# Patient Record
Sex: Male | Born: 1965 | Race: Black or African American | Hispanic: No | Marital: Single | Smoking: Former smoker
Health system: Southern US, Community
[De-identification: ages and names within clinical notes are randomized; demographics above are authoritative.]

## PROBLEM LIST (undated history)

## (undated) DIAGNOSIS — I12 Hypertensive chronic kidney disease with 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: Secondary | ICD-10-CM

## (undated) DIAGNOSIS — R6889 Other general symptoms and signs: Secondary | ICD-10-CM

## (undated) DIAGNOSIS — I272 Pulmonary hypertension, unspecified: Secondary | ICD-10-CM

## (undated) DIAGNOSIS — C778 Secondary and unspecified malignant neoplasm of lymph nodes of multiple regions: Secondary | ICD-10-CM

## (undated) DIAGNOSIS — I10 Essential (primary) hypertension: Secondary | ICD-10-CM

## (undated) DIAGNOSIS — F419 Anxiety disorder, unspecified: Secondary | ICD-10-CM

## (undated) DIAGNOSIS — G4730 Sleep apnea, unspecified: Secondary | ICD-10-CM

## (undated) DIAGNOSIS — N2581 Secondary hyperparathyroidism of renal origin: Secondary | ICD-10-CM

## (undated) DIAGNOSIS — M1990 Unspecified osteoarthritis, unspecified site: Secondary | ICD-10-CM

## (undated) DIAGNOSIS — D631 Anemia in chronic kidney disease: Secondary | ICD-10-CM

## (undated) DIAGNOSIS — K7460 Unspecified cirrhosis of liver: Secondary | ICD-10-CM

## (undated) DIAGNOSIS — I493 Ventricular premature depolarization: Secondary | ICD-10-CM

## (undated) DIAGNOSIS — R0602 Shortness of breath: Secondary | ICD-10-CM

## (undated) DIAGNOSIS — K449 Diaphragmatic hernia without obstruction or gangrene: Secondary | ICD-10-CM

## (undated) DIAGNOSIS — I509 Heart failure, unspecified: Secondary | ICD-10-CM

## (undated) DIAGNOSIS — I440 Atrioventricular block, first degree: Secondary | ICD-10-CM

## (undated) DIAGNOSIS — I471 Supraventricular tachycardia, unspecified: Secondary | ICD-10-CM

## (undated) DIAGNOSIS — Z992 Dependence on renal dialysis: Secondary | ICD-10-CM

## (undated) DIAGNOSIS — D649 Anemia, unspecified: Secondary | ICD-10-CM

## (undated) DIAGNOSIS — I119 Hypertensive heart disease without heart failure: Secondary | ICD-10-CM

## (undated) DIAGNOSIS — T82898A Other specified complication of vascular prosthetic devices, implants and grafts, initial encounter: Secondary | ICD-10-CM

## (undated) DIAGNOSIS — F329 Major depressive disorder, single episode, unspecified: Secondary | ICD-10-CM

## (undated) DIAGNOSIS — E785 Hyperlipidemia, unspecified: Secondary | ICD-10-CM

## (undated) DIAGNOSIS — Z860101 Personal history of adenomatous and serrated colon polyps: Secondary | ICD-10-CM

## (undated) DIAGNOSIS — Z973 Presence of spectacles and contact lenses: Secondary | ICD-10-CM

## (undated) DIAGNOSIS — R011 Cardiac murmur, unspecified: Secondary | ICD-10-CM

## (undated) DIAGNOSIS — C61 Malignant neoplasm of prostate: Secondary | ICD-10-CM

## (undated) DIAGNOSIS — Z87898 Personal history of other specified conditions: Secondary | ICD-10-CM

## (undated) DIAGNOSIS — J189 Pneumonia, unspecified organism: Secondary | ICD-10-CM

## (undated) DIAGNOSIS — F32A Depression, unspecified: Secondary | ICD-10-CM

## (undated) DIAGNOSIS — I639 Cerebral infarction, unspecified: Secondary | ICD-10-CM

## (undated) DIAGNOSIS — N189 Chronic kidney disease, unspecified: Secondary | ICD-10-CM

## (undated) DIAGNOSIS — N289 Disorder of kidney and ureter, unspecified: Secondary | ICD-10-CM

## (undated) DIAGNOSIS — I428 Other cardiomyopathies: Secondary | ICD-10-CM

## (undated) DIAGNOSIS — Z8679 Personal history of other diseases of the circulatory system: Secondary | ICD-10-CM

## (undated) DIAGNOSIS — J449 Chronic obstructive pulmonary disease, unspecified: Secondary | ICD-10-CM

## (undated) DIAGNOSIS — K219 Gastro-esophageal reflux disease without esophagitis: Secondary | ICD-10-CM

## (undated) DIAGNOSIS — Z86010 Personal history of colonic polyps: Secondary | ICD-10-CM

## (undated) DIAGNOSIS — Z5189 Encounter for other specified aftercare: Secondary | ICD-10-CM

## (undated) DIAGNOSIS — A419 Sepsis, unspecified organism: Secondary | ICD-10-CM

## (undated) DIAGNOSIS — N186 End stage renal disease: Secondary | ICD-10-CM

## (undated) HISTORY — DX: Secondary hyperparathyroidism of renal origin: N25.81

## (undated) HISTORY — DX: Unspecified osteoarthritis, unspecified site: M19.90

## (undated) HISTORY — DX: Cardiac murmur, unspecified: R01.1

## (undated) HISTORY — DX: Other cardiomyopathies: I42.8

## (undated) HISTORY — DX: Anemia, unspecified: D64.9

## (undated) HISTORY — DX: Chronic obstructive pulmonary disease, unspecified: J44.9

## (undated) HISTORY — DX: Encounter for other specified aftercare: Z51.89

## (undated) HISTORY — PX: UPPER GASTROINTESTINAL ENDOSCOPY: SHX188

## (undated) HISTORY — DX: Supraventricular tachycardia, unspecified: I47.10

## (undated) HISTORY — DX: Pulmonary hypertension, unspecified: I27.20

## (undated) HISTORY — DX: Ventricular premature depolarization: I49.3

## (undated) HISTORY — DX: Supraventricular tachycardia: I47.1

## (undated) HISTORY — DX: Hyperlipidemia, unspecified: E78.5

## (undated) HISTORY — PX: COLONOSCOPY: SHX174

## (undated) HISTORY — DX: Essential (primary) hypertension: I10

---

## 2002-06-06 ENCOUNTER — Encounter: Payer: Self-pay | Admitting: Emergency Medicine

## 2002-06-06 ENCOUNTER — Encounter: Payer: Self-pay | Admitting: Cardiovascular Disease

## 2002-06-06 ENCOUNTER — Inpatient Hospital Stay (HOSPITAL_COMMUNITY): Admission: EM | Admit: 2002-06-06 | Discharge: 2002-06-08 | Payer: Self-pay | Admitting: Emergency Medicine

## 2003-08-18 ENCOUNTER — Inpatient Hospital Stay (HOSPITAL_COMMUNITY): Admission: EM | Admit: 2003-08-18 | Discharge: 2003-08-22 | Payer: Self-pay | Admitting: Emergency Medicine

## 2003-08-18 ENCOUNTER — Encounter: Payer: Self-pay | Admitting: Internal Medicine

## 2003-11-15 ENCOUNTER — Encounter: Admission: RE | Admit: 2003-11-15 | Discharge: 2003-11-15 | Payer: Self-pay | Admitting: Nephrology

## 2003-12-08 ENCOUNTER — Ambulatory Visit: Payer: Self-pay | Admitting: Cardiovascular Disease

## 2004-04-10 ENCOUNTER — Inpatient Hospital Stay (HOSPITAL_COMMUNITY): Admission: AD | Admit: 2004-04-10 | Discharge: 2004-04-12 | Payer: Self-pay | Admitting: Nephrology

## 2004-06-01 ENCOUNTER — Emergency Department (HOSPITAL_COMMUNITY): Admission: EM | Admit: 2004-06-01 | Discharge: 2004-06-01 | Payer: Self-pay | Admitting: Emergency Medicine

## 2006-12-09 ENCOUNTER — Encounter (HOSPITAL_COMMUNITY): Admission: RE | Admit: 2006-12-09 | Discharge: 2007-03-09 | Payer: Self-pay | Admitting: Nephrology

## 2007-02-04 DIAGNOSIS — N186 End stage renal disease: Secondary | ICD-10-CM

## 2007-02-04 DIAGNOSIS — G4733 Obstructive sleep apnea (adult) (pediatric): Secondary | ICD-10-CM

## 2007-02-04 HISTORY — DX: Obstructive sleep apnea (adult) (pediatric): G47.33

## 2007-02-04 HISTORY — DX: Dependence on renal dialysis: N18.6

## 2007-02-04 HISTORY — PX: AV FISTULA PLACEMENT: SHX1204

## 2007-02-10 ENCOUNTER — Ambulatory Visit: Payer: Self-pay | Admitting: Vascular Surgery

## 2007-02-16 ENCOUNTER — Ambulatory Visit: Payer: Self-pay | Admitting: Cardiology

## 2007-02-18 ENCOUNTER — Ambulatory Visit: Payer: Self-pay | Admitting: Internal Medicine

## 2007-02-18 ENCOUNTER — Inpatient Hospital Stay (HOSPITAL_COMMUNITY): Admission: RE | Admit: 2007-02-18 | Discharge: 2007-02-24 | Payer: Self-pay | Admitting: Vascular Surgery

## 2007-02-18 ENCOUNTER — Ambulatory Visit: Payer: Self-pay | Admitting: Vascular Surgery

## 2007-02-18 DIAGNOSIS — I12 Hypertensive chronic kidney disease with 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: Secondary | ICD-10-CM | POA: Insufficient documentation

## 2007-02-19 ENCOUNTER — Encounter: Payer: Self-pay | Admitting: Vascular Surgery

## 2007-02-22 HISTORY — PX: AV FISTULA PLACEMENT, RADIOCEPHALIC: SHX1208

## 2007-03-05 DIAGNOSIS — Z87891 Personal history of nicotine dependence: Secondary | ICD-10-CM | POA: Insufficient documentation

## 2007-03-05 DIAGNOSIS — Z91199 Patient's noncompliance with other medical treatment and regimen due to unspecified reason: Secondary | ICD-10-CM | POA: Insufficient documentation

## 2007-03-05 DIAGNOSIS — N2581 Secondary hyperparathyroidism of renal origin: Secondary | ICD-10-CM | POA: Insufficient documentation

## 2007-03-11 ENCOUNTER — Ambulatory Visit: Payer: Self-pay | Admitting: *Deleted

## 2007-03-17 ENCOUNTER — Emergency Department (HOSPITAL_COMMUNITY): Admission: EM | Admit: 2007-03-17 | Discharge: 2007-03-18 | Payer: Self-pay | Admitting: Emergency Medicine

## 2007-04-21 ENCOUNTER — Ambulatory Visit: Payer: Self-pay | Admitting: Vascular Surgery

## 2007-04-27 ENCOUNTER — Ambulatory Visit: Payer: Self-pay | Admitting: Vascular Surgery

## 2007-04-27 ENCOUNTER — Ambulatory Visit (HOSPITAL_COMMUNITY): Admission: RE | Admit: 2007-04-27 | Discharge: 2007-04-27 | Payer: Self-pay | Admitting: Vascular Surgery

## 2007-05-11 ENCOUNTER — Ambulatory Visit (HOSPITAL_BASED_OUTPATIENT_CLINIC_OR_DEPARTMENT_OTHER): Admission: RE | Admit: 2007-05-11 | Discharge: 2007-05-11 | Payer: Self-pay | Admitting: Nephrology

## 2007-05-11 ENCOUNTER — Encounter: Payer: Self-pay | Admitting: Internal Medicine

## 2007-05-15 ENCOUNTER — Ambulatory Visit: Payer: Self-pay | Admitting: Internal Medicine

## 2007-05-21 ENCOUNTER — Emergency Department (HOSPITAL_COMMUNITY): Admission: EM | Admit: 2007-05-21 | Discharge: 2007-05-21 | Payer: Self-pay | Admitting: Emergency Medicine

## 2007-05-26 ENCOUNTER — Ambulatory Visit: Payer: Self-pay | Admitting: Vascular Surgery

## 2007-10-19 ENCOUNTER — Ambulatory Visit: Payer: Self-pay | Admitting: Vascular Surgery

## 2007-10-19 ENCOUNTER — Ambulatory Visit (HOSPITAL_COMMUNITY): Admission: RE | Admit: 2007-10-19 | Discharge: 2007-10-19 | Payer: Self-pay | Admitting: Vascular Surgery

## 2007-10-28 ENCOUNTER — Ambulatory Visit (HOSPITAL_COMMUNITY): Admission: RE | Admit: 2007-10-28 | Discharge: 2007-10-28 | Payer: Self-pay | Admitting: Nephrology

## 2008-02-14 ENCOUNTER — Ambulatory Visit (HOSPITAL_BASED_OUTPATIENT_CLINIC_OR_DEPARTMENT_OTHER): Admission: RE | Admit: 2008-02-14 | Discharge: 2008-02-14 | Payer: Self-pay | Admitting: Nephrology

## 2008-02-14 ENCOUNTER — Encounter: Payer: Self-pay | Admitting: Internal Medicine

## 2008-02-19 ENCOUNTER — Ambulatory Visit: Payer: Self-pay | Admitting: Internal Medicine

## 2008-04-06 ENCOUNTER — Ambulatory Visit: Payer: Self-pay | Admitting: Internal Medicine

## 2008-04-06 DIAGNOSIS — I10 Essential (primary) hypertension: Secondary | ICD-10-CM | POA: Insufficient documentation

## 2008-04-06 DIAGNOSIS — J45909 Unspecified asthma, uncomplicated: Secondary | ICD-10-CM | POA: Insufficient documentation

## 2008-04-06 DIAGNOSIS — G4730 Sleep apnea, unspecified: Secondary | ICD-10-CM | POA: Insufficient documentation

## 2008-04-11 ENCOUNTER — Telehealth: Payer: Self-pay | Admitting: Internal Medicine

## 2008-04-17 ENCOUNTER — Encounter: Payer: Self-pay | Admitting: Internal Medicine

## 2010-02-24 ENCOUNTER — Encounter: Payer: Self-pay | Admitting: Nephrology

## 2010-04-24 ENCOUNTER — Inpatient Hospital Stay (INDEPENDENT_AMBULATORY_CARE_PROVIDER_SITE_OTHER)
Admission: RE | Admit: 2010-04-24 | Discharge: 2010-04-24 | Disposition: A | Discharge status: Home / Self Care | Payer: BC Managed Care – PPO | Source: Ambulatory Visit | Attending: Emergency Medicine | Admitting: Emergency Medicine

## 2010-04-24 DIAGNOSIS — H60399 Other infective otitis externa, unspecified ear: Secondary | ICD-10-CM

## 2010-06-18 NOTE — Op Note (Signed)
NAME:  James Wall, James Wall             ACCOUNT NO.:  [id number]   MEDICAL RECORD NO.:  [id number]          PATIENT TYPE:  INP   LOCATION:  2550                         FACILITY:  [facility]   PHYSICIAN:  Theotis Burrow IV, MDDATE OF BIRTH:  [birth year]   DATE OF PROCEDURE:  02/18/2007  DATE OF DISCHARGE:                               OPERATIVE REPORT   PREOPERATIVE DIAGNOSIS:  End-stage renal disease.   POSTOPERATIVE DIAGNOSIS:  End-stage renal disease.   PROCEDURES PERFORMED:  1. Ultrasound access of right internal jugular vein.  2. Diatek catheter placement.   SURGEON:  1. Annamarie Major IV, MD   ANESTHESIA:  Local.   FINDINGS:  Catheter at cavoatrial junction.   INDICATIONS:  This is a 45 year old gentleman who initially presented  for a left-sided wrist fistula, however, developed shortness of breath  in the holding area.  Subsequent chest x-ray revealed worsening  pulmonary edema.  I spoke with nephrology, and we have decided to  proceed with Diatek catheter placement and forego wrist fistula at this  time.  This was discussed with the patient and the family.  Informed  consent was signed.   PROCEDURE:  The patient was identified in the holding area and taken to  room #8.  He was placed supine on the table.  He was then prepped and  draped in the standard sterile fashion.  A timeout was called and  antibiotics were given.  The right internal jugular vein was then  evaluated with ultrasound and found to be widely patent and fully  compressible; 1% lidocaine was used for local anesthesia.  The right  internal jugular vein was accessed under ultrasound guidance with an 18-  gauge needle.  An 0.035 wire was then advanced into the heart under  fluoroscopic visualization.  Next, a series of dilators were used to  dilate the subcutaneous tract.  Ultimately, the peel-away sheath was  placed.  The catheter was then placed through the sheath and then the  sheath was removed.  The  catheter was then positioned with its tip at  the cavoatrial junction.  A site was selected for the skin exit site.  This was then anesthetized with 1% lidocaine as well as the subcutaneous  tunnel.  A #11-blade was used to make an incision and then a  subcutaneous tunnel was then created.  A dilator was used to dilate the  tract.  The catheter was then brought through the tunnel, and the cuff  was situated at the skin exit site.  Fluoroscopy was then used to  confirm that the catheter tip was at the cavoatrial junction and there  were no kinks within the catheter.  Both ports were then flushed.  They  were flushed and aspirated without difficulty.  The catheter was then  sutured into position with a 3-0 nylon.  The side of the neck was closed with a 4-0 Vicryl.  Sterile dressings  were applied.  Catheter was then filled with appropriate volumes of  heparin.  The patient was then taken to the recovery room in a stable  condition.  There were no complications.  ______________________________  V. Leia Alf, MD  Electronically Signed     VWB/MEDQ  D:  02/18/2007  T:  02/18/2007  Job:  [id number]

## 2010-06-18 NOTE — Assessment & Plan Note (Signed)
OFFICE VISIT   James Wall, James Wall  DOB:  Aug 24, 1965                                       02/10/2007  PPHKF#:[id number]   The patient is a 45 year old male referred by Dr. [name] for  consideration for hemodialysis access.  His renal failure is thought to  be secondary to hypertension.  He has had no prior access procedures.  He is currently not on dialysis.  He is right-handed.   He denies history of diabetes, coronary artery disease, or elevated  cholesterol.   SOCIAL HISTORY:  He is single.  Has no children.  Currently smokes 1/2  pack of cigarettes per day.  Does not drink alcohol regularly.   REVIEW OF SYSTEMS:  He is obese.  He is 5 feet 6 inches and 235 pounds.   MEDICATIONS:  Include iron sulfate 325 mg once a day.  Allopurinol 100  mg 3 in the morning.  Labetalol 300 mg 2 twice a day.  Furosemide 80 mg  2 twice a day.  Hectorol 0.5 mg once a day.  Sular 8.5 mg 2 tablets  daily.   PHYSICAL EXAM:  Blood pressure 160/108 in the left arm, 159/107 in the  right arm.  Heart rate is 87 and regular.  He has obese upper arms.  He  has 2+ brachial and radial pulses bilaterally.  On placement of a  tourniquet on the left upper extremity, the left cephalic vein is  palpable at the wrist and at the antecubital level.  He had a vein  mapping ultrasound today, which shows the vein is fairly uniform in  diameter at 3 mm.  It does enlarge to 7 mm at the level of the  antecubital crease.  I believe the best option for this patient is the  creation of a left radiocephalic AV fistula.  I did counsel him that, if  the vein appeared small at the time of the operation, we would place a  brachiocephalic AV fistula instead.  He understands and agrees to  proceed.  I did discuss with him the risks, benefits, and possible  complications of the procedure details, including, but not limited to  bleeding, infection, non-maturation, or thrombosis of the fistula.  He  understands and agrees to proceed.   Jessy Oto. Fields, MD  Electronically Signed   CEF/MEDQ  D:  02/10/2007  T:  02/11/2007  Job:  [id number]   cc:   Jeneen Rinks L. Deterding, M.D.

## 2010-06-18 NOTE — Op Note (Signed)
NAME:  James, Wall             ACCOUNT NO.:  [id number]   MEDICAL RECORD NO.:  [id number]          PATIENT TYPE:  AMB   LOCATION:  SDS                          FACILITY:  [facility]   PHYSICIAN:  Jessy Oto. Fields, MD  DATE OF BIRTH:  1965/02/26   DATE OF PROCEDURE:  04/27/2007  DATE OF DISCHARGE:                               OPERATIVE REPORT   PROCEDURE:  Left brachiocephalic AV fistula.   PREOPERATIVE DIAGNOSIS:  End-stage renal disease.   POSTOPERATIVE DIAGNOSIS:  End-stage renal disease.   ANESTHESIA:  Local with IV sedation.   OPERATIVE FINDINGS:  1. A 6.9-VX left cephalic vein.  2. A 3-mm left brachial artery.   SURGEON:  Jessy Oto. Fields, M.D.   ASSISTANT:  Jacinta Shoe, P.A.-C.   OPERATIVE DETAILS:  After obtaining informed consent, the patient was  taken to the operating room.  The patient was placed in supine position  on the operating table.  After adequate sedation, the patient's entire  left upper extremity prepped and draped usual sterile fashion.  Local  anesthesia was infiltrated near the antecubital crease.  A transverse  incision was made in this location and carried down through the  subcutaneous tissues down to the level of the cephalic vein.  The  cephalic vein was dissected free circumferentially.  Small side branches  were ligated and divided between silk ties.  The vein was approximately  4.5 mm in diameter.  Next, the brachial artery was dissected free in the  medial portion of the incision.  The artery was approximately 3 mm in  diameter.  Vessel loops were placed proximal and distal on the planned  site of arteriotomy.  The patient was then given 5000 units of  intravenous heparin.  The distal cephalic vein was ligated with a silk  tie.  The vein was then transected and swung over to the level of the  artery.  The vein was clamped proximally with a fine bulldog clamp.  Vessel loops were used to control the brachial artery.  A longitudinal  arteriotomy was made.  The vein was then sewn end of vein to side of  artery using a running 7-0 Prolene suture.  Just prior to completion of  the anastomosis, this was fore bled, back bled and thoroughly flushed.  The anastomosis was secured.  Vessel loops were released.  There was a  palpable thrill in the proximal aspect of the fistula.  Next, hemostasis  was obtained.  The subcutaneous tissues were reapproximated using  running 3-0 Vicryl suture.  The skin was closed with 4-0 Vicryl  subcuticular stitch.   The patient tolerated the procedure well, and there were no  complications.  Instrument, sponge, and needle counts were correct at  the end of the case.  The patient was taken to the recovery room in  stable condition.      Jessy Oto. Fields, MD  Electronically Signed     CEF/MEDQ  D:  04/27/2007  T:  04/27/2007  Job:  [id number]

## 2010-06-18 NOTE — H&P (Signed)
NAME:  James Wall, James Wall             ACCOUNT NO.:  [id number]   MEDICAL RECORD NO.:  [id number]          PATIENT TYPE:  INP   LOCATION:  2550                         FACILITY:  [facility]   PHYSICIAN:  Caren Griffins B. Lorrene Reid, M.D.DATE OF BIRTH:  [birth year]   DATE OF ADMISSION:  02/18/2007  DATE OF DISCHARGE:                              HISTORY & PHYSICAL   REASON FOR ADMISSION:  Uremia and pulmonary edema.   HISTORY OF PRESENT ILLNESS:  James Wall is a 45 year old African-  American male with a past medical history that is significant for  nephrosclerosis stage V kidney disease, who was followed by Dr. Jeneen Rinks  Wall.  He also has a significant history of COPD with ongoing  tobacco use, hypertension, anemia, secondary hyperparathyroidism.  The  patient has been followed by James Wall since 2005.  He was  last seen December 07, 2006 by James Wall, [facility] after the patient had  been on an 8 month hiatus.  At that time, all precautions were taken to  control the patient's hypertension.  Also discussed was the possibility  of soon hemodialysis.  He was then consulted with [facility] for permanent  access.   The patient presented to James Wall today for placement of permanent  access by James Wall.  Upon arrival preop, the patient was noted  to be short of breath with oxygen saturations in the 80s.  Pre-op chest  x-ray showed diffuse pulmonary edema.  James Wall spoke with Dr.  Jimmy Wall regarding the urgency and closeness of  patient requiring  hemodialysis.  James Wall did agree that the patient was ready to  start.  Therefore, permanent catheter was placed in the operating room  by James Wall.  We are admitting the patient for initiation of  hemodialysis as well as volume control.   In the recovery room, the patient complains of severe shortness of  breath.  He is currently on a non-rebreather mask.  Over the last 2-3  weeks, the patient explains that he has had some increasing  lethargy.  His appetite has waxed and waned.  He has had some a.m. nausea as well  as vomiting.  He sleeps on 2-3 pillows at night.  He does complain of  dyspnea on exertion.  He has had no chest pain.  No abdominal pain.  No  change in his bowel habits.  No urinary dysuria, burning or frequency.  He does complain of lower extremity fluid that is on and off.  He  states this seems to vary with his sodium intake.   PAST MEDICAL HISTORY:  See HPI.  1. 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      Followed  pre-hemodialysis by James Wall.  1. Congestive heart failure.  2. Anemia of chronic disease.  3. Secondary hyperparathyroidism.  4. COPD with ongoing tobacco use.  5. History of heart murmur followed by James Wall in 2004      (information not available).   ALLERGIES:  The patient states ASPIRIN causes increased fluid.   CURRENT MEDICATIONS:  1. Ferrous sulfate 325 mg daily.  2. Allopurinol  100 mg 2 p.o. daily.  3. Labetalol 300 mg 2 p.o. b.i.d.  4. Lasix 80 mg 2 b.i.d.  5. Hectorol 0.5 mcg daily.  6. Sular 34 mg daily.  7. Aranesp 100 mg subcu. q. week.   FAMILY HISTORY:  Noncontributory.   SOCIAL HISTORY:  The patient currently lives in [place] with his  mother and brother.  He has been out of work since November 2008.  He  has been in [employer] as a Freight forwarder.  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  He continues to smoke tobacco and has  for 20 years.  He smokes 2-3 packs every 2-3 days.  No alcohol.  No  illicit drug use.  Marland Kitchen   REVIEW OF SYSTEMS:  Please see HPI.  Eight systems reviewed and  remainder of the systems are negative.   PHYSICAL EXAMINATION:  VITAL SIGNS:  Temperature 98.6, heart rate 80,  respirations 16, blood pressure 153/76, O2 sats in the 80s on a non-  rebreather mask.  GENERAL:  James Wall is seen post-PermCath placement.  His sentences  are cut short secondary to breathing.  Mild  respiratory distress.  He is  tearful today regarding initiation of hemodialysis.  NECK:  Supple, noted JVD.  HEART:  Regular rate and rhythm, 2/6 systolic murmur heard best at apex.  No gallops or rubs.  LUNGS:  Diffuse crackles mid to base.  EXTREMITIES:  Noted brawny appearance to the calves bilaterally.  Trace  bilateral lower extremity edema.  Pulses 2+ and intact bilateral lower  extremities.  Right permanent catheter in place.   LABORATORY DATA:  Preop labs:  Potassium 4.3, hemoglobin 11.9.   ASSESSMENT/PLAN:  As discussed with Dr. Jamal Wall who was in to see  and evaluate this patient.  1. New end-stage renal disease in the setting of uremia and pulmonary      edema on admission.  The patient will be admitted to the step-down      unit for his mild respiratory distress.  He will undergo urgent      hemodialysis today and again in the morning with a total UF of 6      liters.  He will remain on oxygen for comfort and wean as      appropriate.  Orders given to review hemodialysis videos and      education materials.  Placed on renal vitamin.  Plan access hopeful this admission versus as an outpatient.  Unfortunately, the patient does not have insurance at this time and we  will plan on a social worker to meet with him and discuss financial  Medicaid forms.  His information has been given to [facility] in the dialysis  unit for clip purposes.  I believe his closest hemodialysis center will  be [facility].  Continue to follow the patient through the  night, however, hopeful that ultrafiltration will lessen his respiratory  distress.  Check a 2-D echocardiogram as well to assess LV function  given the patient's pulmonary edema without evidence of peripheral  edema.  1. Hypertension.  Blood pressures systolic in the 924Q.  We will      continue his labetalol and hold if needed.  Change his Sular to      more affordable lisinopril.  There are no allergies.  Continue  to      follow.  2. Anemia.  Stop patient's oral iron.  Continue his Aranesp.  Iron  stores were greater than 30 as an outpatient.  No need for IV iron      at this time.  Follow hemoglobins.  3. Secondary hyperparathyroidism.  Check phosphorus.  The patient was      not on a binder.  Continue Hectorol and change to IV.   DISPOSITION:  Plan on initiating hemodialysis.  Get the patient's  insurance matters handled as well as his outpatient hemodialysis spot.  He will need a permanent access.  Continue to follow.  Plans are to  return home with mother and brother when medically stable.      Leafy Kindle, PA    ______________________________  Elzie Rings Lorrene Reid, M.D.    MY/MEDQ  D:  02/18/2007  T:  02/18/2007  Job:  [id number]   cc:   [place]

## 2010-06-18 NOTE — Assessment & Plan Note (Signed)
OFFICE VISIT   James Wall, James Wall  DOB:  August 06, 1965                                       03/11/2007  PVXYI#:[id number]   Patient is postop left Cimino arteriovenous fistula on 02/22/07, carried  out by Dr. [name].  Incision healing unremarkably.  The fistula is  patent.  No evidence of steal.   BP 163/102, pulse 100 per minute, respirations 20 per minute, O2 sat  92%.   Patient instructed regarding exercise of his arm.  Fistula appears to be  maturing adequately.  Return p.r.n.   Dorothea Glassman, M.D.  Electronically Signed   PGH/MEDQ  D:  03/11/2007  T:  03/15/2007  Job:  [id number]

## 2010-06-18 NOTE — Procedures (Signed)
NAME:  James Wall, James Wall            ACCOUNT NO.:  [id number]   MEDICAL RECORD NO.:  [id number]           PATIENT TYPE:  OUT   LOCATION:  SLEEP CENTER                 FACILITY[facility]   PHYSICIAN:  Clinton D. Annamaria Boots, MD, FCCP, FACPDATE OF BIRTH:  [birth year] 08, 1965   DATE OF STUDY:  05/11/2007                            NOCTURNAL POLYSOMNOGRAM   REFERRING PHYSICIAN:  [name], M.D.   REFERRING PHYSICIAN:  [name], M.D.   INDICATION FOR STUDY:  1. Insomnia with sleep apnea.  2. Concern about hypertension.   EPWORTH SLEEPINESS SCORE:  8/24.   BMI:  36.6.   WEIGHT:  227 pounds.   HEIGHT:  66 inches.   NECK:  16.5 inches.   HOME MEDICATION:  Charted and reviewed.   SLEEP ARCHITECTURE:  Total sleep time 391 minutes with sleep deficiency  93.5%.  Stage 1 was 7.4%, stage 2, 77.1%, stage 3 absent.  REM 15.5% of  total sleep time.  Sleep latency 9 minutes.  REM latency 81 minutes.  Awake after sleep onset 19 minutes.  Arousal index 24.1.  Lisinopril was  taken at 2100 hours.   RESPIRATORY DATA:  Apnea-hypopnea index (AHI) 63.8 indicating severe  central and obstructive sleep apnea/hypopnea syndrome.  The diagnostic  NPSG protocol was ordered and followed.  CPAP was not introduced.  There  were a total of 416 events including 120 obstructive apneas, 26 central  apneas, 16 mixed apneas, and 254 hypopneas.  Events were somewhat more  common while supine.  REM AHI 57.5.   OXYGEN DATA:  Very loud snoring with oxygen desaturation to nadir of  77%.  Mean oxygen saturation through the study was 91.2% on room air.   CARDIAC DATA:  Normal sinus rhythm.   MOVEMENT/PARASOMNIA:  No significant movement disturbance.  No bathroom  trips.   IMPRESSION/RECOMMENDATIONS:  1. 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  2. CPAP titration was not ordered.  Consider return for CPAP titration      protocol or evaluate alternative therapies as appropriate.      Clinton D. Annamaria Boots, MD, FCCP, FACP  Diplomate, Tax adviser of Sleep Medicine  Electronically Signed     CDY/MEDQ  D:  05/15/2007 18:32:34  T:  05/15/2007 19:00:36  Job:  [id number]   cc:   [name], M.D.  Fax: [phone]

## 2010-06-18 NOTE — Procedures (Signed)
NAME:  James Wall, James Wall             ACCOUNT NO.:  [id number]   MEDICAL RECORD NO.:  [id number]          PATIENT TYPE:  OUT   LOCATION:  SLEEP CENTER                 FACILITY:  [facility]   PHYSICIAN:  Clinton D. Annamaria Boots, MD, FCCP, FACPDATE OF BIRTH:  05/30/1965   DATE OF STUDY:  02/14/2008                            NOCTURNAL POLYSOMNOGRAM   REFERRING PHYSICIAN:  [name], M.D.   REFERRING PHYSICIAN:  [name], MD   INDICATION FOR STUDY:  Hypersomnia with sleep apnea.   EPWORTH SLEEPINESS SCORE:  Epworth sleepiness score 5/24.  BMI 36.6.  Weight 227 pounds.  Height 66 inches.  Neck 16.5 inches.   MEDICATIONS:  Home medications charted and reviewed.   A baseline diagnostic NPSG on May 11, 2007, had recorded an AHI of 63.8  per hour.  CPAP titration is requested.   SLEEP ARCHITECTURE:  Total sleep time 284 minutes with sleep efficiency  68.5%.  Stage I was 11.1%.  Stage II 75.4%.  Stage III absent.  REM  13.6% of total sleep time.  Sleep latency 16.5 minutes.  REM latency  151.5 minutes.  Awake after sleep onset 112.5 minutes.  Arousal index  12.7.  Amlodipine was taken at bedtime.   RESPIRATORY DATA:  CPAP titration protocol.  CPAP was titrated to 15  CWP, AHI 2.1 per hour.  He chose a medium ResMed Quattro full face mask  with heated humidifier.   OXYGEN DATA:  Moderate snoring was prevented by CPAP with mean oxygen  saturation 93.8% while on CPAP on room air.   CARDIAC DATA:  Normal sinus rhythm.   MOVEMENT/PARASOMNIA:  No significant movement disturbance.  Bathroom x1.   IMPRESSION/RECOMMENDATION:  1. Successful continuous positive airway pressure titration to 15      centimeters of water pressure, apnea-hypopnea index 2.1 per hour.      He chose a medium ResMed Quattro full face mask with heated      humidifier.  2. Baseline diagnostic nocturnal polysomnogram on May 11, 2007, had      recorded an AHI of 63.8 per hour.  3. The patient arrived in  the laboratory for this study, drinking      caffeinated tea.  The technician asked him to      discontinue the tea because of the caffeine and the patient said he      would not stop drinking that.  He said he would not have returned      for titration if he had known that he was going to be wearing      monitoring leads again.      Clinton D. Annamaria Boots, MD, [place], [place], Tax adviser of Sleep Medicine  Electronically Signed     CDY/MEDQ  D:  02/19/2008 10:51:21  T:  02/20/2008 00:48:04  Job:  [id number]

## 2010-06-18 NOTE — Op Note (Signed)
NAME:  James Wall, James Wall             ACCOUNT NO.:  [id number]   MEDICAL RECORD NO.:  [id number]          PATIENT TYPE:  INP   LOCATION:  6727                         FACILITY:  [facility]   PHYSICIAN:  Jessy Oto. Fields, MD  DATE OF BIRTH:  06/30/1965   DATE OF PROCEDURE:  02/22/2007  DATE OF DISCHARGE:                               OPERATIVE REPORT   PROCEDURE:  Left radiocephalic arteriovenous fistula.   PREOPERATIVE DIAGNOSIS:  End-stage renal disease.   POSTOPERATIVE DIAGNOSIS:  End-stage renal disease.   ANESTHESIA:  Local with IV sedation.   ASSISTANT:  Jacinta Shoe, P.A.   OPERATIVE FINDINGS:  1. 3-mm cephalic vein.  2. 1.5 to 2 mm left radial artery.   OPERATIVE DETAIL:  After obtaining informed consent, the patient taken  to the operating room.  The patient placed in supine position on the  operating table.  After adequate sedation, the patient's entire left  upper extremity prepped and draped in the usual sterile fashion.  Local  anesthesia was infiltrated midway between the cephalic vein and radial  artery.  Longitudinal incision made in this location, carried down  through subcutaneous tissues down to the level of the cephalic vein.  Small side branches were ligated and divided between silk ties.  Cephalic vein was mobilized for approximately 4 cm.  Vein was  approximately 3 mm in diameter.   Next the radial artery was dissected free in the medial portion of  incision.  The artery was quite small and thought to possibly be in  spasm.  The artery was approximately 1.5 to 2 mm in diameter.  It was  pulsatile in character.  This was dissected free circumferentially and  small side branches ligated or divided with clips.  Next, the patient  was given 5000 units of intravenous heparin.  The artery was controlled  proximally and distally with vessel loops.  The vein was transected  distally and ligated with 2-0 silk ties.  The vein was then swung over  to the level of  the artery.  The vein was gently distended with  heparinized saline.  The vein would accept up to a 3.5 mm dilator.  The  vein was marked for orientation.  The artery was controlled proximally  and distally.  Longitudinal arteriotomy was made in the radial artery  and the vein sewn end of vein to side of artery using a running 7-0  Prolene suture.  Just prior to completion of anastomosis this was fore  bled, back bled and thoroughly flushed.  Anastomosis was secured.  Vessel loops were released.  There was good flow into the fistula  immediately.  However, there was not a palpable thrill most likely due  to the small caliber of the vessel.  There was good Doppler flow up to  the level of the midforearm.  Next hemostasis was obtained.  Subcutaneous tissues reapproximated with  running 3-0 Vicryl suture.  Skin was closed with 4-0 Vicryl subcuticular  stitch.  The patient tolerated procedure well and there were no  complications.  Instrument, sponge, needle counts correct at end of the  case.  The patient taken to recovery room in stable condition.      Jessy Oto. Fields, MD  Electronically Signed     CEF/MEDQ  D:  02/22/2007  T:  02/23/2007  Job:  [id number]

## 2010-06-18 NOTE — Procedures (Signed)
CEPHALIC VEIN MAPPING   INDICATION:  End-stage renal disease.   HISTORY:  End-stage renal disease.   EXAM:  Left cephalic vein mapping.   The right cephalic vein is not evaluated.   The left cephalic vein is compressible.   Diameter measurements range from 0.31 to 0.33.   See attached worksheet for all measurements.   IMPRESSION:  Patent left cephalic vein which is of acceptable diameter  for use as a dialysis access site.   ___________________________________________  Jessy Oto. Fields, MD   MG/MEDQ  D:  02/10/2007  T:  02/10/2007  Job:  [id number]

## 2010-06-18 NOTE — Assessment & Plan Note (Signed)
OFFICE VISIT   James Wall, PARISH  DOB:  18-Apr-1965                                       04/21/2007  AYTKZ#:[id number]   Patient returns for followup today.  He is here for consideration of  placement of a new hemodialysis access.  He currently is dialyzing via  Diatek catheter.  He previously had a left radiocephalic AV fistula  placed in 01/09.  The fistula has since occluded.   PHYSICAL EXAMINATION:  Blood pressure is 151/108.  Pulse is 130 and  regular.  Left upper extremity shows a well-healed wrist incision with  no palpable thrill or audible bruit.  He does have an easily palpable  left upper arm cephalic vein.   Previous vein mapping ultrasound of the left arm showed that the  cephalic vein is between 55-73 mm in diameter.  The radial artery was  quite small in the radiocephalic fistula, and this may have been its  cause for occlusion.   I believe the best option for the patient is placement of a left  brachiocephalic AV fistula.  I described to him the risks, benefits,  possible complications, and procedural details, including nonmaturation  of the fistula of 5-10%.  Procedure was scheduled for 04/27/07.   Jessy Oto. Fields, MD  Electronically Signed   CEF/MEDQ  D:  04/22/2007  T:  04/22/2007  Job:  [id number]

## 2010-06-18 NOTE — Assessment & Plan Note (Signed)
OFFICE VISIT   James Wall, AMIRAULT  DOB:  08-Jan-1966                                       05/26/2007  HSVEX#:[id number]   Mr. James Wall returns for followup today after placement of a left  brachiocephalic AV fistula on March 24th.  He currently is dialyzing by  Diatek catheter without difficulty.   EXAM:  Today, blood pressure is 213/118, heart rate is 93 and regular.  Left Upper Extremity:  Shows an easily palpable left brachiocephalic AV  fistula with palpable thrill and audible bruit.  Incision is well  healed.  He has no evidence of steal.   Mr. James Wall fistula should be ready for reuse in approximately 1  month, they will continue to use his Diatek catheter in the meanwhile.   Jessy Oto. Fields, MD  Electronically Signed   CEF/MEDQ  D:  05/26/2007  T:  05/27/2007  Job:  [id number]

## 2010-06-18 NOTE — Consult Note (Signed)
NAME:  James Wall, James Wall             ACCOUNT NO.:  [id number]   MEDICAL RECORD NO.:  [id number]          PATIENT TYPE:  INP   LOCATION:  6727                         FACILITY:  [facility]   PHYSICIAN:  Shaune Pascal. Bensimhon, MDDATE OF BIRTH:  1965/12/08   DATE OF CONSULTATION:  02/21/2007  DATE OF DISCHARGE:                                 CONSULTATION   CONSULTING PHYSICIAN:  Jeneen Rinks L. Deterding, M.D.   CARDIOLOGISTVanna Scotland Olevia Perches, MD, [facility]   REASON FOR CONSULTATION:  Pauses.   HISTORY OF PRESENT ILLNESS:  Mr. 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   He was admitted several days ago with severe respiratory distress,  uremia, and volume overload and was initiated on hemodialysis.  He now  feels much better.   While in hospital, it was noted that he had several episodes of pauses  on the monitor, and we were called to consult.   He denies any chest pain.  He says his breathing is now much better.  He  has not had any recent syncope or presyncope.   REVIEW OF SYSTEMS:  Notable for orthopnea, some lower extremity edema,  PND, and dyspnea on exertion.  He says he has been told he snores  heavily, and many people have told him he probably has sleep apnea.  The  remainder of the Review of Systems is negative except for HPI and  Problem List.   PROBLEM LIST:  1. Severe hypertension.  2. End-stage renal disease, now on hemodialysis.  3. Medical noncompliance.  4. 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  5. Obesity.  6. Chronic obstructive pulmonary disease with ongoing tobacco use.  7. Gout.  8. History of substance abuse.   CURRENT MEDICATIONS:  1. Allopurinol.  2. Protonix 20 a day.  3. Lisinopril 20 a day.  4. Hectorol.  5. Aranesp.  6. Calcium.  7. Clonidine.   ALLERGIES:  ASPIRIN.   SOCIAL HISTORY:  He lives in [place].  He is single.  He lives with  his mother.  He does not have any children.  He works as a Environmental health practitioner.  Smokes 1/2 pack a day of cigarettes for many years, ongoing.  Denies any alcohol use or other current illicit drug use.   FAMILY HISTORY:  Mother is alive.  She has a history of hypertension.  There is history of heart failure in a brother.  Father had a history of  hypertension and a stroke.   PHYSICAL EXAMINATION:  GENERAL:  A very pleasant male lying flat in bed  in no acute distress.  VITAL SIGNS:  Blood pressure 138/97, heart rate 80, temperature 98.0,  saturating 97% on 2 liters nasal cannula.  HEENT:  Normal.  NECK:  Supple and thick.  Unable to assess JVD.  Carotids are 2+  bilaterally without bruits.  There is no lymphadenopathy or thyromegaly  appreciated.  CARDIAC:  PMI is nondisplaced.  He is regular  with an S4.  No murmurs.  LUNGS:  Mild bibasilar crackles.  ABDOMEN:  Obese, nontender nondistended. No appreciable  hepatosplenomegaly, no bruits, no masses. Good bowel sounds.  EXTREMITIES:  Warm with no cyanosis, clubbing, or edema.  NEUROLOGIC:  Alert and oriented x3.  Cranial nerves II-XII intact.  Moves all four extremities without difficulty.  Affect is very pleasant.   LABORATORY DATA:  Hemoglobin 9.8.  Sodium 135,  potassium 4.4, BUN 43,  creatinine 8.31, glucose 85.   EKG shows normal sinus rhythm with left atrial enlargement and rate of  83.  There are T wave inversions latterly and mildly prolonged QT  interval.  QTC is 484 msec.   Review of telemetry shows sinus rhythm.  He has had three to four 2 to  2.5 second pauses.  There has been nothing over that.   ASSESSMENT AND PLAN:  Mr. Goines is a very pleasant 45 year old male  with hypertension and end-stage renal disease.  He was admitted for  uremia and volume overload for initiation of dialysis.  On telemetry, he  has had  several sinus pauses, all less than 2.5 seconds.  These are  asymptomatic.  I suspect that these are related to his underlying sleep  apnea.  There is currently no indication for a pacemaker.  We will place  an overnight pulse oximeter on him to evaluate for sleep apnea and to  see if these episodes correlate with hypoxemic events.  Once this gets  straightened out, I would consider a beta blocker as part of his  treatment for mild nonischemic cardiomyopathy.   We appreciate the consult, and we will follow with you.      Shaune Pascal. Bensimhon, MD  Electronically Signed     DRB/MEDQ  D:  02/21/2007  T:  02/21/2007  Job:  [id number]   cc:   Jeneen Rinks L. Deterding, M.D.  Bruce Alfonso Patten Olevia Perches, MD, [facility]

## 2010-06-18 NOTE — Discharge Summary (Signed)
NAME:  James Wall, James Wall             ACCOUNT NO.:  [id number]   MEDICAL RECORD NO.:  [id number]          PATIENT TYPE:  INP   LOCATION:  6727                         FACILITY:  [facility]   PHYSICIAN:  Caren Griffins B. Lorrene Reid, M.D.DATE OF BIRTH:  [birth year]   DATE OF ADMISSION:  02/18/2007  DATE OF DISCHARGE:  02/24/2007                               DISCHARGE SUMMARY   DISCHARGE DIAGNOSES:  1. Pulmonary edema.  2. End-stage renal disease.  3. Hypertension.  4. Mild cardiomyopathy.  5. Chronic obstructive pulmonary disease with tobacco abuse.  6. Gout.  7. Hypertension.  8. History of substance abuse.  9. Secondary hyperparathyroidism.  10.Anemia.   PROCEDURES:  1. Placement of right IJ dialysis catheter February 18, 2007, Dr.      Trula Slade.  2. Hemodialysis.  3. Placement of left lower arm AV fistula February 22, 2007, Dr.      [name].   HISTORY OF PRESENT ILLNESS:  James Wall is a 45 year old African-  American male with past medical history significant for nephrosclerosis,  stage 5 kidney disease who has been followed by Dr. Jeneen Rinks Deterding.  He  also has a significant history of COPD with ongoing tobacco use,  hypertension, anemia, secondary hyperparathyroidism.  The patient has  been followed by  Dr. Jeneen Rinks Deterding since 2005.  He was last seen November 3 by Amalia Hailey PA-C after the patient had been on an eight month hiatus at that  time.  All precautions were taken to control the patient's hypertension.  Also was discussed the possibility that he would soon need dialysis.  He  was then consulted with [facility] for permanent access, and actually presented  to [facility] the date of admission for placement of permanent access by  Dr. Ruta Hinds.  Upon arrival during preop the patient was noted to  be short of breath with oxygen saturation levels of 80%. Preop chest x-  ray showed diffuse pulmonary edema.  Dr. [name] spoke with Dr. [name]  regarding the urgency and closeness  of the patient requiring  hemodialysis.  Dr. [name] agreed with the initiation of dialysis.  Therefore a tunnel dialysis catheter was placed in the operating room by  Dr. [name].  The patient is being admitted by [facility]  following this to initiate dialysis and control his volume.   In the recovery room the patient continues to have severe shortness of  breath.  He is on a nonrebreather mask over the last 2-3 weeks.  The  patient explains that he has had some increase in lethargy.  Appetite  has waxed and waned.  He has had some a.m. nausea and vomiting.  He  sleeps on 2-3 pillows at night.  He does complain of dyspnea on  exertion.  He has no chest pain or abdominal pain, no change in his  bowel habits.  No urinary dysuria, burning or frequency.  He does  complain of lower extremities fluid that is on and off.  He states that  this seems to vary with his sodium intake.  Please refer to the  remainder of the History and Physical  for additional information.  Preop  labs showed a potassium of 4.3, hemoglobin 11.9.   HOSPITAL COURSE:  Problem #1.  The patient was immediately taken to  dialysis following his insertion of his dialysis catheter.  Predialysis  initiation labs were sodium 138, potassium 4.7, chloride 106, CO2 22,  glucose 94, BUN 62, creatinine 8.63.  Albumin 2.9.  Calcium 7.7.  Phosphorus 5.6.  Hemoglobin was 9.8.  The patient had ultimate lowering  of his dry weight of at least 6 kg during his hospital stay.  Repeat  chest x-ray on the 19th showed minimal residual bibasilar opacities.  Problem #2.  End-stage renal disease.  Dialysis was initiated as  discussed in #1.  He also had a left lower arm AV fistula placed on the  19th prior to discharge which was the real reason he presented for  surgery on the 15th.  Outpatient dialysis arrangements were made.  His  discharge orders were written by Jerelyn Charles NP, and faxed to the  [facility].  The patient will start dialysis there  January 23.  The patient had no significant problems during his dialysis  treatment as rate and time were gradually increased.  Problem #3.  Hypertension.  The patient's blood pressure was improved  with volume removal.  He was able to discontinue his labetalol and  Lasix.  Blood pressures at the time of discharge on January 24 were in  the 135/80-90 range with O2 sats at 48% and a postdialysis weight of  101.2 kg.  At this time he is only taking lisinopril 40 mg daily at  bedtime for blood pressure control.  Problem #4.  Mild cardiomegaly.  The patient had been followed by  [facility] in the past.  Cardiology was reconsulted due to  pulmonary edema being disproportionate to his peripheral edema.  With  the concern of possibly increased ischemia plus or minus worsened blood  pressure control the patient was seen by Dr. Haroldine Laws. The patient's  echo showed LVH with mild diffuse LV hypokinesis and an EF of 45-50%.  Telemetry showed sinus pauses.  Dr. Haroldine Laws felt sinus pauses were  most likely due to obstructive sleep apnea.  He recommended setting up  evaluation for sleep apnea after discharge and to avoid nodal blocking  agents.  Problem #5.  Chronic obstructive pulmonary disease.  The patient  continues to smoke.  He had smoking cessation education documented  several times during his stay.  Problem #6.  Obesity.  It is hoped that the patient will increase his  activity level once he is feeling better after becoming well dialyzed.  Problem #7.  Gout.  The patient continues on allopurinol.  Problem #8.  Secondary hyperparathyroidism.  The patient was on Hectorol  prior to admission as well as calcium supplements.  His oral Hectorol  has been stopped, and he will be on IV Hectorol at his dialysis center  as well as PhosLo three tablets with meals as his binderless renal panel  before discharge showed calcium of 8.3, phosphorus  8.6.  In spite of  this this may be because his intact PTH done on the 15th was 1800.  As  PTH becomes better controlled I expect to see improved phosphorus, and  if not noncalcium containing binders will be added or Sensipar.  Problem #9.  Anemia.  The patient's hemoglobin on admission was 9.8.  He  was on max dose of Aranesp. Hemoglobin at discharge had come up to 10.6.  He will be on combination of Aranesp and iron when needed after  discharge.  He has been advised to stop taking his oral iron  supplements.   CONDITION ON DISCHARGE:  Improved.   DISCHARGE DIET:  Renal diet.  Limit fluids to 1200 mL per day.   ACTIVITY:  Activity level is as tolerated.  He is instructed not to take  any showers or go swimming while he has a dialysis catheter in his neck.   DISCHARGE MEDICATIONS:  1. Allopurinol 100 mg two tablets daily.  2. PhosLo 667 mg three tablets with each meal.  3. DiabeVite one daily.  4. Lisinopril 40 mg at bedtime.   DISCHARGE DIALYSIS ORDERS:  The specifics of these orders were written  on additional dialysis order form and faxed to his dialysis center.  They are not available in the chart at the time of this dictation.      Alric Seton, P.A.    ______________________________  Elzie Rings Lorrene Reid, M.D.    MB/MEDQ  D:  03/14/2007  T:  03/15/2007  Job:  [id number]   cc:   Shaune Pascal. Bensimhon, MD  [place], [place]

## 2010-06-21 NOTE — Discharge Summary (Signed)
NAME:  James Wall, [name]                       ACCOUNT NO.:  [id number]   MEDICAL RECORD NO.:  [id number]                   PATIENT TYPE:  INP   LOCATION:  0867                                 FACILITY:  [facility]   PHYSICIAN:  Jenkins Rouge, M.D.                  DATE OF BIRTH:  [birth year]   DATE OF ADMISSION:  06/06/2002  DATE OF DISCHARGE:  06/08/2002                           DISCHARGE SUMMARY - REFERRING   PROCEDURES:  MRI/MRA of the abdomen and chest.   HOSPITAL COURSE:  This patient is a 45 year old male with no known history  of coronary artery disease.  He has a history of hypertension, but he has  been noncompliant with his medications.  He was evaluated by cardiology for  a four-day history of epigastric discomfort that radiated to the umbilicus  and down both sides of his abdomen; it was associated with dyspnea on  exertion.  His blood pressure at that time was 200/140.  He was admitted for  hypertensive crisis and further evaluation.   The patient had had blood pressure medications added to his medication  regimen, including Toprol, Vistaril, and Norvasc; he tolerated these well,  and his blood pressure was still elevated at times, 130 to 150, and  occasionally spiked higher, but it was felt that this could be further  evaluated and treated as an outpatient.  The patient was advised that he  really needed to be compliant with his medications, and he stated that he  would.   His abdominal pain was evaluated by internal medicine, and he had an MRI and  MRA of his abdomen.  The MRI/MRA showed no evidence of hepatitis,  pancreatitis, nephrolithiasis, appendicitis, diverticulitis, and  cholecystitis was doubted.  The gallbladder did not show any stones or  obvious abnormalities, but an ultrasound was recommended for further  evaluation.  He was started on a protime pump inhibitor and is to continue  on this.   Because of his hypertension, an echocardiogram was felt  indicated, but this  will be obtained as an outpatient.  Additionally, he had a urine drug screen  performed that was positive for tetrahydrocannabinol but nothing else.  He  has a history of renal insufficiency, and his creatinine was 1.9 on admit; a  recheck was performed the next day, and it was 1.7 with a BUN of 18 and a  potassium of 4.3.   The patient had no more episodes of abdominal pain and no chest pain.  His  enzymes were negative for MI, and it was felt that he could be evaluated as  an outpatient.  His creatinine was stable, and he was ambulating without  chest pain or shortness of breath.  The patient was considered stable for  discharge on 06/08/2002.   LABORATORY VALUES:  Hemoglobin 13.1, hematocrit 39.0, WBCs 7.3, his  platelets 226.  Sodium 137, potassium 4.3, chloride 102, CO2 of 27, BUN 18,  creatinine 1.7, glucose 86.  Serial CK-MB and troponin-I negative for MI.  Amylase and lipase within normal limits.  Total cholesterol 167,  triglycerides 110, HDL 43, LDL 102.   Urine drug screen positive for tetrahydrocannabinol.   Chest x-ray, bibasilar atelectasis with heart upper limits of normal in  size.   MRA of the chest with and without contrast, ascending aortic arch and  descending thoracic aorta were all unremarkable without evidence of aneurysm  or dissection.  There was minimal dependent atelectasis in the lungs.  In  the abdomen, the aorta was unremarkable with no evidence of renal artery  stenosis.  The celiac artery, SMA, and IMA were widely patent.  A less than  1-cm cyst was noted in the right kidney.  No focal lesion was seen in the  liver, spleen, pancreas, adrenals, or left kidney.   DISCHARGE CONDITION:  Improved.   DISCHARGE DIAGNOSES:  1. Epigastric pain, resolved.  2. Hypertension crisis, improved.  3. Renal insufficiency with a creatinine of 1.9 on admission, 1.7 at     discharge.  4. Noncompliance with medication; the patient states will  improve on this.  5. History of gastrointestinal upset secondary to aspirin.  6. Status post cyst removal on the right side of his neck and sutures     secondary to a dog bite above the right eye.  7. History of heart murmur.   DISCHARGE INSTRUCTIONS:   ACTIVITY:  His activity level is to be as tolerated.   FOLLOW UP:  1. He is to follow up with Dr. Loanne Drilling on Jun 29, 2002, at 2 o'clock.  2. He is to get an echo and have a P.A. appointment on Jun 17, 2002.   DIET:  He is to stick to a low-fat and -salt diet.   DISCHARGE MEDICATIONS:  1. Protonix 40 mg daily.  2. Toprol XL 100 mg daily.  3. Zestril 20 mg daily.  4. Norvasc 10 mg daily.     Davis Gourd, P.A. [facility]                  Jenkins Rouge, M.D.    RG/MEDQ  D:  06/08/2002  T:  06/09/2002  Job:  [id number]   cc:   Hilliard Clark A. Loanne Drilling, M.D. [facility]   Eustace Quail, M.D.

## 2010-06-21 NOTE — H&P (Signed)
NAME:  James Wall, James Wall                       ACCOUNT NO.:  [id number]   MEDICAL RECORD NO.:  [id number]                   PATIENT TYPE:  EMS   LOCATION:  MAJO                                 FACILITY:  [facility]   PHYSICIAN:  Junious Silk, M.D. [facility]         DATE OF BIRTH:  Dec 17, 1965   DATE OF ADMISSION:  08/18/2003  DATE OF DISCHARGE:                                HISTORY & PHYSICAL   Cardiologist:  Eustace Quail, M.D.  No primary caregiver.   PRESENTING CIRCUMSTANCE:  I couldn't stand being not able to breathe.   HISTORY OF PRESENT ILLNESS:  James Wall is a 45 year old male with a  history of hypertension and specifically noncompliance in taking his  medications for this.  He also has incipient renal compromise by past lab  studies.  Echocardiogram in the past, May 2004, showed moderate left  ventricular hypertrophy and normal left ventricular function.  Now the  patient is admitted in respiratory distress with nine days of progressive  dyspnea, edema, fatigue.  His dyspnea became insupportable overnight last  night.  Chest x-ray shows pulmonary edema, cardiomegaly.  He is not having  chest pain.  He has no prior history of coronary artery disease.  BNP was  1378.  Enzymes show only mild troponin I elevations.   ALLERGIES:  ASPIRIN irritates his stomach.   MEDICATIONS:  The patient is taking no medications.   PAST MEDICAL HISTORY:  1. Uncontrolled hypertension/noncompliance.  2. Asthma.  3. Substance abuse.  4. History of renal insufficiency with baseline creatinine 1.9, now 2.8 on     presentation.   SOCIAL HISTORY:  The patient lives in [place] with his grandmother, works  as a Scientist, water quality.  He is single.  He smokes one-half pack per day.  Says that he  quit last Thursday.  He takes alcohol about once a month.  He has a regular  diet.  Does not use drugs, although has a history of substance abuse.   FAMILY HISTORY:  Mother with hypertension, father with  hypertension,  deceased, however had a stroke.  He has two sisters who are alive and well.  One brother has a history of CHF and asthma.   REVIEW OF SYSTEMS:  The patient complains of a weight gain in the last six  months.  HEENT:  No epistaxis, no voice change, no vertigo, no photophobia.  INTEGUMENT:  No rashes, lesions, or ulcerations.  CARDIOPULMONARY:  No chest  pain.  He is short of breath with exertion.  He does have mild orthopnea and  marked edema.  No paroxysmal nocturnal dyspnea.  No history of palpitations,  presyncope, or syncope.  No claudication symptoms.  UROGENITAL:  Nocturia  four to five times a night.  NEUROPSYCHIATRIC:  Weakness and fatigue  secondary to volume overload.  MUSCULOSKELETAL:  No complaints.  GASTROINTESTINAL:  He has right upper quadrant pain, sudden onset of  symptoms, which are severe, causing him to be  writhing.  Mild  gastroesophageal reflux symptoms.  ENDOCRINE:  No prior history of diabetes  or thyroid disease.  The patient denies any prior history of peptic ulcer  disease or GI bleeding.  No history of myocardial infarction or  cerebrovascular accident.  No history of pulmonary embolism or deep venous  thrombosis.  No history of seizures.  All other systems are negative.   PHYSICAL EXAMINATION:  GENERAL:  This is an alert and oriented gentleman,  very distressed, right upper quadrant, with shortness of breath, writhing in  bed.  VITAL SIGNS:  Temperature 98.4, pulse is 113, respirations 24, blood  pressure 212/138, oxygen saturation 95% on 3.5 L.  HEENT:  Eyes:  Pupils equal, round, and reactive to light.  Extraocular  movements intact.  Sclerae are clear.  Mucous membranes are pink, moist,  without lesion or erythema.  The patient does not wear dentures.  NECK:  Supple, no carotid bruits auscultated, 6 cm jugular venous  distention.  No cervical lymphadenopathy.  CARDIAC:  Tachycardia with distant murmur.  CHEST:  Lungs diminished at both  bases.  INTEGUMENT:  No rashes or lesions.  ABDOMEN:  Obese, abdominal aorta nonpalpable.  Right upper quadrant  tenderness, which improves with morphine.  Bowel sounds are present.  No  rebound or guarding on palpation of the abdomen.  No hepatosplenomegaly.  UROGENITAL, RECTAL:  Deferred.  EXTREMITIES:  2+ pretibial edema, lower extremities.  No clubbing or  cyanosis.  MUSCULOSKELETAL:  No joint deformity, effusions.  He has 4/4 radial pulses  bilaterally, 4/4 dorsalis pedis pulses bilaterally.  NEUROLOGIC:  No neurologic deficits.   Chest x-ray:  cardiomegaly, pulmonary edema.  Electrocardiogram:  Rate is  109, sinus tachycardia, +30 degrees is the axis.  Intervals:  PR is 163, QRS  is 88, QTC is 415.  There is hypertrophy and repolarization abnormalities in  V4 through V6.  BNP is 1378.  Complete blood count:  White cells 10.2,  hemoglobin 10.6, hematocrit 31.8, platelets 319.  Serum electrolytes:  Sodium is 135, potassium 3.4, chloride 100, carbonate 25, BUN is 22,  creatinine 2.8, glucose 114.  Enzymes:  Troponin I studies sequentially:  Less than 0.05, then less than 0.05, and then 0.06.   IMPRESSION:  1. Admitted with progressive dyspnea.  2. Uncontrolled hypertension.  3. Congestive heart failure with elevated B-natriuretic peptide on     admission.  4. Progressive renal compromise with creatinine on admission 2.8.  5. Right upper quadrant tenderness.   PLAN:  1. Admit the patient.  2. Continue diuresis with Lasix 80 mg IV q.8h., place Foley.  3. Follow creatinine.  Renal consult if creatinine worsens.  4. Replenish potassium and monitor on daily BMETs.  5. Cycle enzymes q.8h.  6. Hypertension control with addition of beta blocker, Lopressor 50 mg     q.6h., and Norvasc 10 mg.  7. 2 D echocardiogram.  8. Abdominal ultrasound to rule out cholelithiasis.  9. Social worker consult to provide medication support for this patient, who    is unable to afford essential  hypertension medications.   This examination and review of medical history has been conducted by Junious Silk, M.D., and this plan has been formulated by him.      Sueanne Margarita, P.A.                    Junious Silk, M.D. [facility]    GM/MEDQ  D:  08/18/2003  T:  08/18/2003  Job:  [id number] 

## 2010-06-21 NOTE — Discharge Summary (Signed)
NAME:  James Wall, James Wall                       ACCOUNT NO.:  [id number]   MEDICAL RECORD NO.:  [id number]                   PATIENT TYPE:  INP   LOCATION:  4782                                 FACILITY:  [facility]   PHYSICIAN:  Junious Silk, M.D. [facility]         DATE OF BIRTH:  18-Nov-1965   DATE OF ADMISSION:  08/18/2003  DATE OF DISCHARGE:  08/22/2003                           DISCHARGE SUMMARY - REFERRING   BRIEF HISTORY:  James Wall is a 45 year old man with a history of  hypertension and noncompliance with his medications.  He also has a history  of incipient renal compromise by past lab studies.  He had an echo in the  past that had showed LVE to normal LV function.  The patient presented with  respiratory distress with nine days of progressive dyspnea, edema, and  fatigue.  It became worse overnight, thus his presentation.  His history is  also notable for asthma, substance abuse, continued tobacco abuse, aspirin  intolerance, renal insufficiency with a baseline creatinine of 1.9.   LABORATORY DATA:  Admission H&H was 11.6 and 34.0 with normal indices.  Platelets 319, WBC 10.2.  Subsequent hematology on the 19th showed an H&H of  11.1 and 33.5, normal indices.  Platelets 477.  Admission PT was 13.6, PTT  29.  Admission sodium 135, potassium 3.4, BUN 22, creatinine 2.8.  Subsequent chemistry on the 16th showed a BUN and creatinine of 26 and 2.3,  potassium 3.7, and normal LFTs; however, his total bilirubin was quite  elevated at 1.3 with a protein and albumin low at 5.5 and 0.4, respectively.  Subsequent chemistries continued to show elevated BUN and creatinine.  At  the time of discharge, BUN was 28 and creatinine 3.8.  CK total MB and  relative index were negative for myocardial infarction.  Troponins were  borderline at 0.11, 0.11, and 0.12.  BNP on admission was elevated at  1378.2; on the 19th it was  __________.  TSH on the 16th was 3.918.  Urinalysis obtained on the 18th  was notable for 300 mg/dL of protein.  Chest  x-ray on admission showed cardiomegaly and pulmonary edema.  Abdominal  films, ultrasound on the 15th did not show any evidence of gallstones,  increased echogenicity of renal parenchyma bilaterally suggestive of medical  renal disease.  Pancreas and abdominal aorta were not well visualized  secondary to bowel gas.  Consider CT imaging.  Right kidney measured 9.8 cm,  left 10 cm in length.  Echocardiogram obtained on the 15th revealed a small  pericardial effusion, mild PI, mild RVH with normal RV function, mild MR.  EF was approximately 15%.  Moderate LVH.  Doppler parameters were consistent  with elevated mean left atrial filling pressure.   HOSPITAL COURSE:  [name] was admitted to 60 by Marcellus Scott and Dr.  Vicenta Aly.  His blood pressure initially on admission was 212/138.  They  felt his CHF was secondary to uncontrolled  blood pressure with progressive  renal compromise.  Overnight he was diuresed and enzymes were cycled.  He  ruled out for myocardial infarction.  Overnight he diuresed well and he was  feeling much better.  Medications were adjusted.  Nursing staff assisted  with educational videos on CHF; however, patient eventually refused to watch  the videos.  Admission weight was not recorded.  First weight available was  written down on the 16th at 222.6.  At the time of discharge, his weight was  215.5.  Over the next several days his diuresis continued, his potassium was  supplemented, and medications continued to be adjusted.  Renal consult was  obtained on the 18th.  They agreed with the current treatment to maximize  medical therapy.  An abdominal ultrasound was obtained secondary to the  patient's complaint of upper quadrant tenderness and this was unremarkable.  Smoking cessation consult was obtained.  Case management also begun;  involved to assist with financial and medication matters.  On the 19th, Dr.  Vicenta Aly  after reviewing felt that the patient was stable for discharge.  Dr. [name] also saw the patient prior to his discharge and stated that  his PA would make followup arrangements.   DISCHARGE DIAGNOSES:  1. Congestive heart failure with echocardiogram as previously described.  2. Uncontrolled hypertension secondary to noncompliance with medications.  3. Tobacco use.  4. Chronic renal insufficiency.  5. Hypokalemia.   DISPOSITION:  James Wall is discharged home.   NEW MEDICATIONS:  1. Lopressor 50 mg 1-1/2 tablets b.i.d.  2. Lasix 40 mg b.i.d.  3. Norvasc 10 mg q.d.  4. Lisinopril 5 mg q.d.  5. K-Dur 10 mEq q.d.   He was instructed to maintain a low-salt diet, weigh daily, and to bring all  weights and medications to all followup appointments.  He was advised to  continue not to smoke and he will follow up with Dr. Nichola Sizer physician  assistant on August 5 at 10:00 a.m.  It is noted there is not an available  appointment with Dr. Olevia Perches before September.  He was also asked to call Dr.  Deterding's office to arrange a followup appointment with renal.  Prior to  discharge, case management will again see the patient to assist with any  medications and I will also try to get some samples from the office that he  may pick up on his way home.      Sharyl Nimrod, P.A. [facility]                    Junious Silk, M.D. [facility]    EW/MEDQ  D:  08/22/2003  T:  08/22/2003  Job:  [id number]   cc:   Eustace Quail, M.D. [facility]   James L. Deterding, M.D.  [place]  [place] [address]  Fax: [phone]

## 2010-06-21 NOTE — H&P (Signed)
NAME:  James Wall, James Wall                       ACCOUNT NO.:  [id number]   MEDICAL RECORD NO.:  [id number]                   PATIENT TYPE:  INP   LOCATION:  1824                                 FACILITY:  [facility]   PHYSICIAN:  Jenkins Rouge, M.D.                  DATE OF BIRTH:  27-Aug-1965   DATE OF ADMISSION:  06/06/2002  DATE OF DISCHARGE:                                HISTORY & PHYSICAL   HISTORY OF PRESENT ILLNESS:  The patient is a 45 year old patient previously  seen by Dr. Olevia Perches.  He needs a primary care physician.   He has longstanding known hypertension.  He has been noncompliant with his  medications.   There is a history of substance abuse and, I believe, recent marijuana use.  He also is a smoker.   He comes in with an elevated blood pressure of 200/140.   He has not had any neurological symptoms but he has had epigastric  discomfort radiating down both sides of his abdomen and his umbilicus.   ALLERGIES:  No known drug allergies.   MEDICATIONS:  His medicines are irrelevant since he has not been taking  them.   PAST MEDICAL HISTORY:  This is remarkable for hypertension, substance abuse,  history of baseline creatinine of 1.9.  He has had a history of some  gastritis in the past but is not on medicines for it.   SOCIAL HISTORY:  He lives in [place], [place].  He is single.  He  smokes a half a pack a day.  He lives with his mom.  He works at the  [employer].   PHYSICAL EXAMINATION:  VITAL SIGNS:  Blood pressure is 200/100.  NECK:  Carotids normal.  LUNGS:  Clear.  CARDIOVASCULAR:  There is an S1 and S2 with an S4 gallop.  ABDOMEN:  Abdominal aorta is palpable but there is no rebound and no  tenderness.  EXTREMITIES:  Distal pulses are intact and no edema.   LABORATORY DATA:  His creatinine is 1.9.  His enzymes initially are  negative.   Chest x-ray shows no significant mediastinal widening with mild atelectasis.   IMPRESSION:  The patient  is definitely on his way to end-stage dialysis if  he does not take his high blood pressure more seriously.  I talked to him  and his mom at length about this.   PLAN:  He will be placed back on his normal medications which include a beta-  blocker, calcium blocker, and Zestril.  He will be maintained on IV  nitroglycerin until his blood pressure comes down some.   He actually should be admitted by the primary care service as he has no  acute coronary syndrome.  He has chronic high blood pressure with renal  insufficiency and epigastric pain and needs close follow-up in the medial  clinic here at [facility]. Archbald his  epigastric pain and severe hypertension, I think it may be  reasonable to do an MRA of his thoracic and abdominal aorta as well as his  renal arteries.   The Gadolinium would not cause any significant renal insufficiency and he is  not claustrophobic.  He was given 5 mg of Valium in the ER to sedate him.                                               Jenkins Rouge, M.D.    PN/MEDQ  D:  06/06/2002  T:  06/06/2002  Job:  [id number]

## 2010-06-21 NOTE — H&P (Signed)
NAME:  GAVINO, FOUCH             ACCOUNT NO.:  [id number]   MEDICAL RECORD NO.:  [id number]          PATIENT TYPE:  INP   LOCATION:  6708                         FACILITY:  [facility]   PHYSICIAN:  James L. 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 [name] on January 10, 2004 due to  stage III kidney disease secondary to hypertension.  He presents to [place] today  due to a 24-hour history of nausea, vomiting, loose stool, shortness of  breath, minimal rhinorrhea/cough, and fever.  He denies dysuria, sore  throat, body aches, decreased urine volume, nonhealing sores, or recent  surgery.  Despite the above symptoms patient continued his Tarka, however,  for the last several weeks has not taken furosemide as directed (160 mg p.o.  b.i.d.) due to abdominal/lower extremity cramping that he believes is caused  by the diuretic.  He states that he does not have a primary care physician.  The only other physician he sees is [facility].   PAST MEDICAL HISTORY:  1.  Chronic renal insufficiency stage III.  2.  Hypertension.  3.  Asthma.  4.  History of congestive heart failure secondary to hypertension.  Last      [facility] hospitalization July 2005 followed by Dr. Vicenta Aly at      [facility].  5.  2-D echocardiogram in July 2005 showed a normal left ventricular size,      ejection fraction 50%, mild mitral regurgitation, and normal right      ventricular function.  6.  Iron deficient anemia.  7.  Secondary hyperparathyroidism.  8.  Gout.   SOCIAL HISTORY:  Works at [employer] as a Scientist, water quality.  Lives with mother.  Denies tobacco or alcohol use.  Does have a remote drug history, however,  currently denies using.  Does not have children.   FAMILY HISTORY:  Mother alive  with hypertension.  Father deceased with  history of hypertension and cerebrovascular accident.  Brother alive with  congestive heart failure.   ALLERGIES:  ASPIRIN, unknown reaction.   MEDICATIONS:  1.  Metoprolol 50 mg p.o. b.i.d.  However, Dr. Deterding's note states that      he takes two tablets p.o. b.i.d.  2.  Furosemide 160 mg p.o. b.i.d. for last 48 hours.  Prior to that was      taking 80 to 40 mg p.o. b.i.d. for several weeks.  3.  Iron sulfate 325 mg p.o. daily.  4.  Tarka 4/240 mg one tablet p.o. q.h.s.  5.  Calcitriol 0.25 mcg p.o. daily.  6.  Allopurinol 100 mg two tablets p.o. daily.   REVIEW OF SYSTEMS:  See HPI and past medical history.  States he has had a  decreased appetite with malaise for last 24 hours.  Denies chest pain, black  melena-appearing stools, prolonged bleeding, headache, change in  vision/hearing, loss of consciousness, lumps, trauma, joint pain,  or  diabetes mellitus.   LABORATORIES:  April 10, 2004 in our office:  Hemoglobin 13, platelets 274,  white count 6.9, neutrophils 70%, lymphocytes 13%, monocytes 14%,  eosinophils 3%.  Sodium 135, potassium 4.3, chloride 98, CO2 26, BUN 19,  creatinine 2.6, glucose 116.  Glom filtration rate 33.5.  Influenza nasal  swab A and B pending.  Blood cultures x2 pending.  Urine cultures pending.  UA:  Leukocyte negative, nitrite negative, protein 4+, pH 5.0, blood 2+,  specific gravity  1.030, glucose negative, wbc's 0-1, rbc's 0-2, epithelials  negative, casts granular, bacteria +1, moderate amorphus material.  Laboratories on March 15, 2004 at our office:  Sodium 138, potassium 3.8,  chloride 102, CO2 24, BUN 25, creatinine 2.7, glucose 141, calcium 9.2,  phosphorous 3.7, albumin 3.9, glom filtration rate 32.   PHYSICAL EXAMINATION:  VITAL SIGNS:  Supine blood pressure 193/108, heart  rate 92, sitting blood pressure 175/97, heart rate 94, standing blood  pressure 170/102, heart rate 104.  He is up 7.5  pounds from previous weight  on March 15, 2004.  Temperature today 104.5.  GENERAL:  Tired-appearing, obese black male.  No acute distress.  However,  appears short of breath.  HEENT:  Pupils are equal, round, and reactive to light/accommodation.  Nares  patent, boggy.  Throat clear.  TMs clear.  NECK:  Cervical lymphadenopathy.  No JVD.  ABDOMEN:  Decreased bowel sounds, obese, distended.  Diffusely tender in all  four quadrants.  CARDIAC:  Regular rate and rhythm without clicks, gallops, or rubs.  Murmur  grade 2/6 at left sternal border.  LUNGS:  Coarse rhonchi throughout with scattered inspiratory wheezing.  EXTREMITIES:  Trace pretibial edema.  No pedal edema.  Feet warm.  No signs  of bacterial infection to feet.  SKIN:  No rash or breakdown except in between toes.   ASSESSMENT/PLAN:  1.  Febrile illness secondary to community-acquired pneumonia versus viral      lower respiratory illness versus influenza.  Blood culture x2, nasal      swab, and urine cultures pending.  Will get chest x-ray.  Give      intravenous antibiotics and acetaminophen p.r.n.  Of note, we gave      acetaminophen 1 g p.o. x1 at 2:30 this afternoon in the office.  2.  Chronic renal insufficiency.  Will hold Tarka.  Recheck renal in a.m.      Will await urine culture and give strict ins and outs.  3.  Congestive heart failure.  Give intravenous Lasix and again strict ins      and outs.  4.  Hypertension.  Give metoprolol.  Scheduled clonidine p.r.n. and, again,      will hold Tarka.  5.  Dyspepsia.  Will give PPI and an antiemetic.  6.  Diarrhea.  Will give Imodium and check for Clostridium difficile given      his close contact with public.  7.  Secondary hyperparathyroidism.  Continue his vitamin D.  8.  Iron deficiency anemia.  Continue his iron p.o.  9.  Tenia pedis.  10. Disposition.  Wishes to return home after discharge.     MJG/MEDQ  D:  04/10/2004  T:  04/10/2004  Job:  [id number]   cc:    Junious Silk, M.D. [facility]

## 2010-06-21 NOTE — Discharge Summary (Signed)
NAME:  James Wall, James Wall             ACCOUNT NO.:  [id number]   MEDICAL RECORD NO.:  [id number]          PATIENT TYPE:  INP   LOCATION:  6708                         FACILITY:  [facility]   PHYSICIAN:  James L. Deterding, M.D.DATE OF BIRTH:  [birth year]   DATE OF ADMISSION:  04/10/2004  DATE OF DISCHARGE:  04/12/2004                                 DISCHARGE SUMMARY   ADMISSION DIAGNOSES:  1.  Febrile illness.  2.  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  3.  Hypertension.  4.  Secondary hyperparathyroidism.   DISCHARGE DIAGNOSES:  1.  Febrile illness, resolved, suspect viral etiology. Workup negative.  2.  Nausea, vomiting, and diarrhea secondary to febrile illness, resolved.  3.  Chronic kidney disease, stage 3, secondary to hypertensive      nephrosclerosis.  4.  Hypertension.  5.  Secondary hyperparathyroidism.   HISTORY OF PRESENT ILLNESS:  A 45 year old male with chronic kidney disease  secondary to hypertensive nephrosclerosis, brought to the office with 24  hour history of nausea, vomiting, diarrhea, shortness of breath, mild  rhinorrhea, cough and fever. He has been taking his Tarka medication but has  not taken Furosemide for a couple of weeks due to abdominal and lower  extremity cramping that he believes is caused by the diuretic. His blood  pressure in the office supine is 193/108 and standing, 170/102. He denies  dysuria, chest pain, muscle aches and pains, decreased urine output, any  open wounds or sores.   LABORATORY DATA:  On admission white count 13,000, hemoglobin 6.9, platelets  274,000. Sodium 135, potassium 4.3, chloride 98, CO2 26, BUN 19, creatinine  2.6, glucose 116.   HOSPITAL COURSE:  The patient was admitted and placed on his usual  medications with the exception of Lasix, which was held. Blood cultures were  drawn and have remained negative over the course of this hospitalization.  Urine and C-difficile toxin  stool culture were obtained also and remained  negative. His urinalysis was clean with 0 to 2 white blood cells and red  blood cells per high powered field. His white blood cell count dropped to  6,300 within the first 24 hours. His chest x-ray showed vascular crowding  and basilar atelectasis with mild vascular congestion but no overt pulmonary  edema and no focal infiltrate. The patient's symptoms resolved and he became  afebrile within 24 hours. His anti-hypertensives were increased for better  blood pressure control. His Lasix was resumed at time of discharge. IT was  felt that he had a viral syndrome, which defervesced and resolved at the  time of discharge. He will followup with Dr. [name] on May 06, 2004 at  2:15 p.m. He has been instructed to stop at the office on April 16, 2004 for  blood work and blood pressure check.   DISCHARGE MEDICATIONS:  1.  Lasix 160 mg p.o. b.i.d.  2.  Tarka 4/240 1 q.h.s.  3.  Metoprolol 50 mg b.i.d.  4.  Ferrous sulfate 325 mg once a day.  5.  Calcitrol 0.25 mcg once a day.  6.  Allopurinol 200 mg daily.  7.  Phenergan 25 mg pill 1 every 6 hours p.r.n. nausea, prescription given.  8.  The patient was instructed to STOP Labetalol and continue Metoprolol.   DISCHARGE LABORATORY DATA:  Creatinine at discharge 3.2, BUN 26, potassium  3.8, hemoglobin 12.9, white count 5,900. Blood pressure 152/66.      RRK/MEDQ  D:  04/13/2004  T:  04/14/2004  Job:  [id number]

## 2010-06-21 NOTE — Consult Note (Signed)
NAME:  James Wall, James Wall                       ACCOUNT NO.:  [id number]   MEDICAL RECORD NO.:  [id number]                   PATIENT TYPE:  INP   LOCATION:  4704                                 FACILITY:  [facility]   PHYSICIAN:  James L. Deterding, M.D.            DATE OF BIRTH:  03-11-1965   DATE OF CONSULTATION:  DATE OF DISCHARGE:                                   CONSULTATION   CONSULTING PHYSICIAN:  Dr. Wyonia Hough Pulsipher of [facility].   REASON FOR CONSULT:  Severe hypertension and renal insufficiency.   HISTORY OF PRESENT ILLNESS:  This is a 45 year old gentleman with  hypertension, he says at least 2 years, recently has not been taking his  medicines, and some of it is because of social and some of it is because he  just did not want to.  He was hospitalized in May of last year with a  similar event to what he presents with now; his creatinine was 1.7.  Now he  presents with shortness of breath, dyspnea on exertion and PND of about a  week's duration.  He noted ankle edema, nocturia x4-5 and easy fatigue.  He  has a positive history in his father who died at age 48 of a CVA and had bad  hypertension.  He had a grandmother with hypertension.  He has no UTI,  stones or hematuria.  He has a positive family history of night blindness  but no family history of hearing defects or early blindness.   He has a question of an allergy to aspirin, known history of nonsteroidal  use, no street drugs, rare alcohol use.  He presented with congestive heart  failure, blood pressure of 230/130s and he has been treated with blood  pressure control and diuresis at this time and is much less symptomatic.   PAST MEDICAL HISTORY:  Past medical history includes the above.  He has also  had a cyst removed from his neck.   ALLERGIES:  Question of allergy to ASPIRIN.   REVIEW OF SYSTEMS:  HEENT:  No visual difficulties, hearing difficulties,  sores, and denies dry eyes, dry mouth or headaches.   CARDIOVASCULAR:  As  above.  PULMONARY:  No history of asthma, hay fever or bronchitis.  GI:  No  nausea, vomiting, diarrhea.  No history of bloody or black stools, hepatitis  or yellow jaundice.  SKIN:  Unremarkable.  MUSCULOSKELETAL:  No aches and  pains at this time.  No history of arthritis.  NEUROLOGICAL:  Negative.   FAMILY HISTORY:  As listed above.  He also has 3 siblings, none of which  have high blood pressure; 1 has asthma.   SOCIAL HISTORY:  He has no children.  He drinks no more than once a month  and about 1 drink.  He quit smoking over 5 years ago and had only a 5-pack-  year history.  He lives with his grandmother.  OBJECTIVE:   PHYSICAL EXAM:  GENERAL:  In general, in no acute distress.  VITAL SIGNS:  Blood pressure is 180/80, supine; 106/78, standing.  HEENT:  Silver-wiring and A-V nicking, cotton-wool spots.  NECK:  No significant abnormalities in the neck.  No thyromegaly.  LUNGS:  Lungs reveal normal percussion, normal expansion, normal breath  sounds, no rales, rhonchi or wheezing.  CARDIOVASCULAR:  Regular rhythm.  No murmur.  PMI is 12 cm lateral the  midsternal line at the 5th intercostal space with left ventricular lift.  Pulses are 2+/4+.  No bruits are noted.  He has trace to 1+ edema.  ABDOMEN:  Positive bowel sounds.  Liver is down 4-5 cm, nontender.  SKIN:  Skin is unremarkable and without lesions.  MUSCULOSKELETAL:  Musculoskeletal shows no abnormalities.  NODES:  No significant adenopathy in the axillae or supraclavicular or  posterior cervical adenopathy.   LABORATORY AND ACCESSORY CLINICAL DATA:  Hemoglobin is 11.6, white count of  10,200, platelets of 319,000.   Ultrasound shows right to be 9.8 cm, left to be 10 cm -- kidneys.  They are  echodense.   Sodium is 138, potassium 3.8, chloride of 100, bicarbonate 30, creatinine  3.8, BUN of 29, glucose of 92.   ASSESSMENT:  1. Hypertension, now decreasing and actually well-controlled, maybe a  little     bit too well-controlled.  We will add low-dose James Wall inhibitors.  There is     a body of evidence that shows that it may slow the progression of renal     disease, especially in the setting of nephrosclerosis alone.  I suspect     he has nephrosclerosis.   He has significant and enduring disease including his eyes, kidneys and  heart.  Management includes his medications, diet and exercise and weight  control.   1. Chronic renal insufficiency.  His creatinine is increased, as expected,     with decreasing in his blood pressure and controlling it.  Glomerular     filtration rate is about 35 mL a minute.  He has stage III __________     disease and he may evolve into end-stage renal disease in the future.   We need to see if we can slow the complications as well as decrease the  factors that lead to that including his blood pressure, anemia, restrict his  diet and control complications such as hyperparathyroidism and lipids.  Need  to rule out other secondary causes and check urinalysis.   1. Congestive heart failure is controlled.   1. Non-adherence.  This will limit his outcomes.   PLAN:  1. Maximize medication kinetics.  2. Low-dose James Wall inhibitors.  3. Continue Lasix.  4. Urinalysis.  5. PTH.                                               James L. Deterding, M.D.    JLD/MEDQ  D:  08/21/2003  T:  08/21/2003  Job:  [id number]

## 2010-10-23 LAB — CBC
HCT: 30.4 — ABNORMAL LOW
Hemoglobin: 9.9 — ABNORMAL LOW
MCHC: 32.7
MCV: 85.7
Platelets: 269
RBC: 3.55 — ABNORMAL LOW
RDW: 17.3 — ABNORMAL HIGH
WBC: 8.5

## 2010-10-23 LAB — IRON AND TIBC
Iron: 74
Saturation Ratios: 27
TIBC: 276
UIBC: 202

## 2010-10-23 LAB — FERRITIN: Ferritin: 54 (ref 22–322)

## 2010-10-24 LAB — CBC
HCT: 29.8 — ABNORMAL LOW
HCT: 29.9 — ABNORMAL LOW
Hemoglobin: 10.6 — ABNORMAL LOW
Hemoglobin: 9.8 — ABNORMAL LOW
Hemoglobin: 9.9 — ABNORMAL LOW
MCHC: 32.3
MCHC: 32.8
MCHC: 33
MCV: 87.2
MCV: 90.1
Platelets: 268
RBC: 3.41 — ABNORMAL LOW
RBC: 3.65 — ABNORMAL LOW
RDW: 19 — ABNORMAL HIGH
RDW: 19.3 — ABNORMAL HIGH
WBC: 9.1

## 2010-10-24 LAB — RENAL FUNCTION PANEL
Albumin: 2.7 — ABNORMAL LOW
Albumin: 2.9 — ABNORMAL LOW
BUN: 43 — ABNORMAL HIGH
BUN: 56 — ABNORMAL HIGH
BUN: 62 — ABNORMAL HIGH
BUN: 68 — ABNORMAL HIGH
CO2: 22
CO2: 24
Calcium: 7.7 — ABNORMAL LOW
Calcium: 7.8 — ABNORMAL LOW
Calcium: 8.3 — ABNORMAL LOW
Chloride: 100
Chloride: 106
Creatinine, Ser: 12.37 — ABNORMAL HIGH
Creatinine, Ser: 8.41 — ABNORMAL HIGH
Creatinine, Ser: 8.63 — ABNORMAL HIGH
GFR calc Af Amer: 5 — ABNORMAL LOW
GFR calc Af Amer: 8 — ABNORMAL LOW
GFR calc non Af Amer: 4 — ABNORMAL LOW
GFR calc non Af Amer: 7 — ABNORMAL LOW
Glucose, Bld: 113 — ABNORMAL HIGH
Glucose, Bld: 126 — ABNORMAL HIGH
Glucose, Bld: 85
Glucose, Bld: 94
Phosphorus: 5.6 — ABNORMAL HIGH
Phosphorus: 6.4 — ABNORMAL HIGH
Potassium: 4.4
Potassium: 4.7
Sodium: 138

## 2010-10-24 LAB — POCT I-STAT 4, (NA,K, GLUC, HGB,HCT)
Glucose, Bld: 97
HCT: 35 — ABNORMAL LOW
Hemoglobin: 11.9 — ABNORMAL LOW
Operator id: 181601
Potassium: 4.3
Sodium: 137

## 2010-10-24 LAB — COMPREHENSIVE METABOLIC PANEL
ALT: 10
CO2: 25
Calcium: 8.4
Chloride: 97
GFR calc non Af Amer: 6 — ABNORMAL LOW
Glucose, Bld: 91
Sodium: 137
Total Bilirubin: 0.7

## 2010-10-24 LAB — HEPATITIS B SURFACE ANTIGEN: Hepatitis B Surface Ag: NEGATIVE

## 2010-10-24 LAB — PHOSPHORUS: Phosphorus: 9.1 — ABNORMAL HIGH

## 2010-10-24 LAB — AMYLASE: Amylase: 75

## 2010-10-25 LAB — I-STAT 8, (EC8 V) (CONVERTED LAB)
BUN: 30 — ABNORMAL HIGH
Chloride: 103
Glucose, Bld: 83
Potassium: 4.6
pCO2, Ven: 58.1 — ABNORMAL HIGH
pH, Ven: 7.33 — ABNORMAL HIGH

## 2010-10-28 LAB — POCT I-STAT 4, (NA,K, GLUC, HGB,HCT)
Glucose, Bld: 105 — ABNORMAL HIGH
Operator id: 181601
Potassium: 4.7
Sodium: 137

## 2010-11-11 LAB — CBC
HCT: 33.2 — ABNORMAL LOW
MCHC: 32.8
MCV: 86.8
Platelets: 317

## 2010-11-11 LAB — IRON AND TIBC
Iron: 52
UIBC: 294

## 2010-11-11 LAB — PTH, INTACT AND CALCIUM: Calcium, Total (PTH): 9

## 2011-02-05 DIAGNOSIS — D509 Iron deficiency anemia, unspecified: Secondary | ICD-10-CM | POA: Diagnosis not present

## 2011-02-05 DIAGNOSIS — D631 Anemia in chronic kidney disease: Secondary | ICD-10-CM | POA: Diagnosis not present

## 2011-02-05 DIAGNOSIS — Z992 Dependence on renal dialysis: Secondary | ICD-10-CM | POA: Diagnosis not present

## 2011-02-05 DIAGNOSIS — N2581 Secondary hyperparathyroidism of renal origin: Secondary | ICD-10-CM | POA: Diagnosis not present

## 2011-02-05 DIAGNOSIS — N186 End stage renal disease: Secondary | ICD-10-CM | POA: Diagnosis not present

## 2011-02-07 DIAGNOSIS — D509 Iron deficiency anemia, unspecified: Secondary | ICD-10-CM | POA: Diagnosis not present

## 2011-02-07 DIAGNOSIS — N186 End stage renal disease: Secondary | ICD-10-CM | POA: Diagnosis not present

## 2011-02-07 DIAGNOSIS — D631 Anemia in chronic kidney disease: Secondary | ICD-10-CM | POA: Diagnosis not present

## 2011-02-07 DIAGNOSIS — N2581 Secondary hyperparathyroidism of renal origin: Secondary | ICD-10-CM | POA: Diagnosis not present

## 2011-02-07 DIAGNOSIS — Z992 Dependence on renal dialysis: Secondary | ICD-10-CM | POA: Diagnosis not present

## 2011-02-10 DIAGNOSIS — N2581 Secondary hyperparathyroidism of renal origin: Secondary | ICD-10-CM | POA: Diagnosis not present

## 2011-02-10 DIAGNOSIS — N039 Chronic nephritic syndrome with unspecified morphologic changes: Secondary | ICD-10-CM | POA: Diagnosis not present

## 2011-02-10 DIAGNOSIS — D631 Anemia in chronic kidney disease: Secondary | ICD-10-CM | POA: Diagnosis not present

## 2011-02-10 DIAGNOSIS — Z992 Dependence on renal dialysis: Secondary | ICD-10-CM | POA: Diagnosis not present

## 2011-02-10 DIAGNOSIS — D509 Iron deficiency anemia, unspecified: Secondary | ICD-10-CM | POA: Diagnosis not present

## 2011-02-10 DIAGNOSIS — N186 End stage renal disease: Secondary | ICD-10-CM | POA: Diagnosis not present

## 2011-02-12 DIAGNOSIS — D631 Anemia in chronic kidney disease: Secondary | ICD-10-CM | POA: Diagnosis not present

## 2011-02-12 DIAGNOSIS — Z992 Dependence on renal dialysis: Secondary | ICD-10-CM | POA: Diagnosis not present

## 2011-02-12 DIAGNOSIS — D509 Iron deficiency anemia, unspecified: Secondary | ICD-10-CM | POA: Diagnosis not present

## 2011-02-12 DIAGNOSIS — N186 End stage renal disease: Secondary | ICD-10-CM | POA: Diagnosis not present

## 2011-02-12 DIAGNOSIS — N2581 Secondary hyperparathyroidism of renal origin: Secondary | ICD-10-CM | POA: Diagnosis not present

## 2011-02-14 DIAGNOSIS — N039 Chronic nephritic syndrome with unspecified morphologic changes: Secondary | ICD-10-CM | POA: Diagnosis not present

## 2011-02-14 DIAGNOSIS — Z992 Dependence on renal dialysis: Secondary | ICD-10-CM | POA: Diagnosis not present

## 2011-02-14 DIAGNOSIS — N2581 Secondary hyperparathyroidism of renal origin: Secondary | ICD-10-CM | POA: Diagnosis not present

## 2011-02-14 DIAGNOSIS — D631 Anemia in chronic kidney disease: Secondary | ICD-10-CM | POA: Diagnosis not present

## 2011-02-14 DIAGNOSIS — D509 Iron deficiency anemia, unspecified: Secondary | ICD-10-CM | POA: Diagnosis not present

## 2011-02-14 DIAGNOSIS — N186 End stage renal disease: Secondary | ICD-10-CM | POA: Diagnosis not present

## 2011-02-17 DIAGNOSIS — N039 Chronic nephritic syndrome with unspecified morphologic changes: Secondary | ICD-10-CM | POA: Diagnosis not present

## 2011-02-17 DIAGNOSIS — D631 Anemia in chronic kidney disease: Secondary | ICD-10-CM | POA: Diagnosis not present

## 2011-02-17 DIAGNOSIS — D509 Iron deficiency anemia, unspecified: Secondary | ICD-10-CM | POA: Diagnosis not present

## 2011-02-17 DIAGNOSIS — N186 End stage renal disease: Secondary | ICD-10-CM | POA: Diagnosis not present

## 2011-02-17 DIAGNOSIS — N2581 Secondary hyperparathyroidism of renal origin: Secondary | ICD-10-CM | POA: Diagnosis not present

## 2011-02-17 DIAGNOSIS — Z992 Dependence on renal dialysis: Secondary | ICD-10-CM | POA: Diagnosis not present

## 2011-02-19 DIAGNOSIS — Z992 Dependence on renal dialysis: Secondary | ICD-10-CM | POA: Diagnosis not present

## 2011-02-19 DIAGNOSIS — N2581 Secondary hyperparathyroidism of renal origin: Secondary | ICD-10-CM | POA: Diagnosis not present

## 2011-02-19 DIAGNOSIS — N186 End stage renal disease: Secondary | ICD-10-CM | POA: Diagnosis not present

## 2011-02-19 DIAGNOSIS — D631 Anemia in chronic kidney disease: Secondary | ICD-10-CM | POA: Diagnosis not present

## 2011-02-19 DIAGNOSIS — N039 Chronic nephritic syndrome with unspecified morphologic changes: Secondary | ICD-10-CM | POA: Diagnosis not present

## 2011-02-19 DIAGNOSIS — D509 Iron deficiency anemia, unspecified: Secondary | ICD-10-CM | POA: Diagnosis not present

## 2011-02-22 DIAGNOSIS — N2581 Secondary hyperparathyroidism of renal origin: Secondary | ICD-10-CM | POA: Diagnosis not present

## 2011-02-22 DIAGNOSIS — Z992 Dependence on renal dialysis: Secondary | ICD-10-CM | POA: Diagnosis not present

## 2011-02-22 DIAGNOSIS — D509 Iron deficiency anemia, unspecified: Secondary | ICD-10-CM | POA: Diagnosis not present

## 2011-02-22 DIAGNOSIS — N186 End stage renal disease: Secondary | ICD-10-CM | POA: Diagnosis not present

## 2011-02-22 DIAGNOSIS — D631 Anemia in chronic kidney disease: Secondary | ICD-10-CM | POA: Diagnosis not present

## 2011-02-24 DIAGNOSIS — Z992 Dependence on renal dialysis: Secondary | ICD-10-CM | POA: Diagnosis not present

## 2011-02-24 DIAGNOSIS — N2581 Secondary hyperparathyroidism of renal origin: Secondary | ICD-10-CM | POA: Diagnosis not present

## 2011-02-24 DIAGNOSIS — N186 End stage renal disease: Secondary | ICD-10-CM | POA: Diagnosis not present

## 2011-02-24 DIAGNOSIS — D631 Anemia in chronic kidney disease: Secondary | ICD-10-CM | POA: Diagnosis not present

## 2011-02-24 DIAGNOSIS — D509 Iron deficiency anemia, unspecified: Secondary | ICD-10-CM | POA: Diagnosis not present

## 2011-02-26 DIAGNOSIS — D631 Anemia in chronic kidney disease: Secondary | ICD-10-CM | POA: Diagnosis not present

## 2011-02-26 DIAGNOSIS — N039 Chronic nephritic syndrome with unspecified morphologic changes: Secondary | ICD-10-CM | POA: Diagnosis not present

## 2011-02-26 DIAGNOSIS — Z992 Dependence on renal dialysis: Secondary | ICD-10-CM | POA: Diagnosis not present

## 2011-02-26 DIAGNOSIS — N186 End stage renal disease: Secondary | ICD-10-CM | POA: Diagnosis not present

## 2011-02-26 DIAGNOSIS — E1159 Type 2 diabetes mellitus with other circulatory complications: Secondary | ICD-10-CM | POA: Diagnosis not present

## 2011-02-26 DIAGNOSIS — D509 Iron deficiency anemia, unspecified: Secondary | ICD-10-CM | POA: Diagnosis not present

## 2011-02-26 DIAGNOSIS — N2581 Secondary hyperparathyroidism of renal origin: Secondary | ICD-10-CM | POA: Diagnosis not present

## 2011-02-28 DIAGNOSIS — N2581 Secondary hyperparathyroidism of renal origin: Secondary | ICD-10-CM | POA: Diagnosis not present

## 2011-02-28 DIAGNOSIS — D509 Iron deficiency anemia, unspecified: Secondary | ICD-10-CM | POA: Diagnosis not present

## 2011-02-28 DIAGNOSIS — Z992 Dependence on renal dialysis: Secondary | ICD-10-CM | POA: Diagnosis not present

## 2011-02-28 DIAGNOSIS — D631 Anemia in chronic kidney disease: Secondary | ICD-10-CM | POA: Diagnosis not present

## 2011-02-28 DIAGNOSIS — N186 End stage renal disease: Secondary | ICD-10-CM | POA: Diagnosis not present

## 2011-03-03 DIAGNOSIS — N2581 Secondary hyperparathyroidism of renal origin: Secondary | ICD-10-CM | POA: Diagnosis not present

## 2011-03-03 DIAGNOSIS — D631 Anemia in chronic kidney disease: Secondary | ICD-10-CM | POA: Diagnosis not present

## 2011-03-03 DIAGNOSIS — Z992 Dependence on renal dialysis: Secondary | ICD-10-CM | POA: Diagnosis not present

## 2011-03-03 DIAGNOSIS — N186 End stage renal disease: Secondary | ICD-10-CM | POA: Diagnosis not present

## 2011-03-03 DIAGNOSIS — D509 Iron deficiency anemia, unspecified: Secondary | ICD-10-CM | POA: Diagnosis not present

## 2011-03-03 DIAGNOSIS — N039 Chronic nephritic syndrome with unspecified morphologic changes: Secondary | ICD-10-CM | POA: Diagnosis not present

## 2011-03-05 DIAGNOSIS — Z992 Dependence on renal dialysis: Secondary | ICD-10-CM | POA: Diagnosis not present

## 2011-03-05 DIAGNOSIS — D631 Anemia in chronic kidney disease: Secondary | ICD-10-CM | POA: Diagnosis not present

## 2011-03-05 DIAGNOSIS — D509 Iron deficiency anemia, unspecified: Secondary | ICD-10-CM | POA: Diagnosis not present

## 2011-03-05 DIAGNOSIS — N2581 Secondary hyperparathyroidism of renal origin: Secondary | ICD-10-CM | POA: Diagnosis not present

## 2011-03-05 DIAGNOSIS — N039 Chronic nephritic syndrome with unspecified morphologic changes: Secondary | ICD-10-CM | POA: Diagnosis not present

## 2011-03-05 DIAGNOSIS — N186 End stage renal disease: Secondary | ICD-10-CM | POA: Diagnosis not present

## 2011-03-06 DIAGNOSIS — N186 End stage renal disease: Secondary | ICD-10-CM | POA: Diagnosis not present

## 2011-03-07 DIAGNOSIS — N186 End stage renal disease: Secondary | ICD-10-CM | POA: Diagnosis not present

## 2011-03-07 DIAGNOSIS — N2581 Secondary hyperparathyroidism of renal origin: Secondary | ICD-10-CM | POA: Diagnosis not present

## 2011-03-07 DIAGNOSIS — D509 Iron deficiency anemia, unspecified: Secondary | ICD-10-CM | POA: Diagnosis not present

## 2011-03-07 DIAGNOSIS — D631 Anemia in chronic kidney disease: Secondary | ICD-10-CM | POA: Diagnosis not present

## 2011-04-02 DIAGNOSIS — D509 Iron deficiency anemia, unspecified: Secondary | ICD-10-CM | POA: Diagnosis not present

## 2011-04-02 DIAGNOSIS — N186 End stage renal disease: Secondary | ICD-10-CM | POA: Diagnosis not present

## 2011-04-02 DIAGNOSIS — N2581 Secondary hyperparathyroidism of renal origin: Secondary | ICD-10-CM | POA: Diagnosis not present

## 2011-04-02 DIAGNOSIS — Z992 Dependence on renal dialysis: Secondary | ICD-10-CM | POA: Diagnosis not present

## 2011-04-03 DIAGNOSIS — N186 End stage renal disease: Secondary | ICD-10-CM | POA: Diagnosis not present

## 2011-04-04 DIAGNOSIS — D631 Anemia in chronic kidney disease: Secondary | ICD-10-CM | POA: Diagnosis not present

## 2011-04-04 DIAGNOSIS — D509 Iron deficiency anemia, unspecified: Secondary | ICD-10-CM | POA: Diagnosis not present

## 2011-04-04 DIAGNOSIS — N2581 Secondary hyperparathyroidism of renal origin: Secondary | ICD-10-CM | POA: Diagnosis not present

## 2011-04-04 DIAGNOSIS — Z992 Dependence on renal dialysis: Secondary | ICD-10-CM | POA: Diagnosis not present

## 2011-04-04 DIAGNOSIS — N186 End stage renal disease: Secondary | ICD-10-CM | POA: Diagnosis not present

## 2011-04-10 DIAGNOSIS — H93299 Other abnormal auditory perceptions, unspecified ear: Secondary | ICD-10-CM | POA: Diagnosis not present

## 2011-04-10 DIAGNOSIS — H608X9 Other otitis externa, unspecified ear: Secondary | ICD-10-CM | POA: Diagnosis not present

## 2011-05-04 DIAGNOSIS — N186 End stage renal disease: Secondary | ICD-10-CM | POA: Diagnosis not present

## 2011-05-05 DIAGNOSIS — Z992 Dependence on renal dialysis: Secondary | ICD-10-CM | POA: Diagnosis not present

## 2011-05-05 DIAGNOSIS — N186 End stage renal disease: Secondary | ICD-10-CM | POA: Diagnosis not present

## 2011-05-05 DIAGNOSIS — N2581 Secondary hyperparathyroidism of renal origin: Secondary | ICD-10-CM | POA: Diagnosis not present

## 2011-05-05 DIAGNOSIS — D509 Iron deficiency anemia, unspecified: Secondary | ICD-10-CM | POA: Diagnosis not present

## 2011-05-05 DIAGNOSIS — D631 Anemia in chronic kidney disease: Secondary | ICD-10-CM | POA: Diagnosis not present

## 2011-05-28 DIAGNOSIS — E1159 Type 2 diabetes mellitus with other circulatory complications: Secondary | ICD-10-CM | POA: Diagnosis not present

## 2011-06-03 DIAGNOSIS — N186 End stage renal disease: Secondary | ICD-10-CM | POA: Diagnosis not present

## 2011-06-04 DIAGNOSIS — N186 End stage renal disease: Secondary | ICD-10-CM | POA: Diagnosis not present

## 2011-06-04 DIAGNOSIS — D631 Anemia in chronic kidney disease: Secondary | ICD-10-CM | POA: Diagnosis not present

## 2011-06-04 DIAGNOSIS — N2581 Secondary hyperparathyroidism of renal origin: Secondary | ICD-10-CM | POA: Diagnosis not present

## 2011-06-04 DIAGNOSIS — D509 Iron deficiency anemia, unspecified: Secondary | ICD-10-CM | POA: Diagnosis not present

## 2011-06-17 ENCOUNTER — Encounter: Payer: Self-pay | Admitting: Vascular Surgery

## 2011-06-23 ENCOUNTER — Encounter: Payer: Self-pay | Admitting: Vascular Surgery

## 2011-06-24 ENCOUNTER — Ambulatory Visit (INDEPENDENT_AMBULATORY_CARE_PROVIDER_SITE_OTHER): Payer: Medicare Other | Admitting: Vascular Surgery

## 2011-06-24 ENCOUNTER — Encounter: Payer: Self-pay | Admitting: Vascular Surgery

## 2011-06-24 VITALS — BP 209/133 | HR 93 | Resp 18 | Ht 66.0 in | Wt 198.2 lb

## 2011-06-24 DIAGNOSIS — N186 End stage renal disease: Secondary | ICD-10-CM

## 2011-06-24 NOTE — Progress Notes (Signed)
The patient presents today for evaluation of left upper arm AV fistula. This was placed by Dr. [name] in March of 2009. He has had an outstanding use of this as this time. 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 He reports that on one occasion his fistula was thought to be occluded and sent for attempted thrombolyzes. Presentation the graft was actually patent and he did not have a shuntogram. He reports that he was getting good used with good flows and no high venous pressures. He is not having any pain over his fistula. He has not had any prolonged bleeding after access.  Past Medical History  Diagnosis Date  . Chronic kidney disease   . Hypertension   . COPD (chronic obstructive pulmonary disease)   . Arthritis     GOUT  . Anemia   . Secondary hyperparathyroidism     History  Substance Use Topics  . Smoking status: Current Everyday Smoker -- 0.5 packs/day  . Smokeless tobacco: Not on file  . Alcohol Use: Yes     on rare occassions    History reviewed. No pertinent family history.  Allergies  Allergen Reactions  . Aspirin     Current outpatient prescriptions:allopurinol (ZYLOPRIM) 100 MG tablet, Take 100 mg by mouth daily., Disp: , Rfl: ;  amLODipine-valsartan (EXFORGE) 10-320 MG per tablet, Take 1 tablet by mouth daily., Disp: , Rfl: ;  Calcium Acetate, Phos Binder, (PHOSLO PO), Take 1,000 mg by mouth., Disp: , Rfl: ;  cloNIDine (CATAPRES - DOSED IN MG/24 HR) 0.3 mg/24hr, Place 1 patch onto the skin once a week., Disp: , Rfl:  labetalol (NORMODYNE) 300 MG tablet, Take 300 mg by mouth 2 (two) times daily., Disp: , Rfl: ;  lisinopril (PRINIVIL,ZESTRIL) 40 MG tablet, Take 40 mg by mouth daily. Take 2 tablets daily., Disp: , Rfl: ;  doxercalciferol (HECTOROL) 0.5 MCG capsule, Take 1 mcg by mouth daily., Disp: , Rfl: ;  furosemide (LASIX) 80 MG tablet, Take 80 mg by mouth 2 (two) times daily., Disp: , Rfl:   BP 209/133  Pulse 93   Resp 18  Ht 5' 6"  (1.676 m)  Wt 198 lb 3.2 oz (89.903 kg)  BMI 31.99 kg/m2  Body mass index is 31.99 kg/(m^2).       Physical exam well-developed well-nourished black male in no acute distress. He has 2+ radial pulses bilaterally. He has a large fistula in his left upper arm. His his venous aneurysms over several areas of this was too prominent areas. He does have an excellent thrill in his fistula and does have development of cephalic vein up past the deltopectoral groove towards his chest. He does not have any evidence of skin breakdown over the fistula.  Impression and plan: A good used for 4 years of his upper arm AV fistula. He has had progression of venous aneurysmal change. I explained that he may have some central venous stenosis make this more possible but that this can occur with prolonged use of fistula. I explained that as long as he is having adequate clearance which he is in his lungs he is having no evidence of skin breakdown, would recommend continued observation. He understands the concern if he should have skin breakdown I explained what to look for and they should notify us immediately. Otherwise he'll see [place] on an as-needed basis

## 2011-07-04 DIAGNOSIS — N186 End stage renal disease: Secondary | ICD-10-CM | POA: Diagnosis not present

## 2011-07-07 DIAGNOSIS — N186 End stage renal disease: Secondary | ICD-10-CM | POA: Diagnosis not present

## 2011-07-07 DIAGNOSIS — Z992 Dependence on renal dialysis: Secondary | ICD-10-CM | POA: Diagnosis not present

## 2011-07-07 DIAGNOSIS — D631 Anemia in chronic kidney disease: Secondary | ICD-10-CM | POA: Diagnosis not present

## 2011-07-07 DIAGNOSIS — D509 Iron deficiency anemia, unspecified: Secondary | ICD-10-CM | POA: Diagnosis not present

## 2011-07-07 DIAGNOSIS — N2581 Secondary hyperparathyroidism of renal origin: Secondary | ICD-10-CM | POA: Diagnosis not present

## 2011-08-03 DIAGNOSIS — N186 End stage renal disease: Secondary | ICD-10-CM | POA: Diagnosis not present

## 2011-08-04 DIAGNOSIS — D631 Anemia in chronic kidney disease: Secondary | ICD-10-CM | POA: Diagnosis not present

## 2011-08-04 DIAGNOSIS — N186 End stage renal disease: Secondary | ICD-10-CM | POA: Diagnosis not present

## 2011-08-04 DIAGNOSIS — D509 Iron deficiency anemia, unspecified: Secondary | ICD-10-CM | POA: Diagnosis not present

## 2011-08-04 DIAGNOSIS — N2581 Secondary hyperparathyroidism of renal origin: Secondary | ICD-10-CM | POA: Diagnosis not present

## 2011-08-04 DIAGNOSIS — E119 Type 2 diabetes mellitus without complications: Secondary | ICD-10-CM | POA: Diagnosis not present

## 2011-08-04 DIAGNOSIS — Z992 Dependence on renal dialysis: Secondary | ICD-10-CM | POA: Diagnosis not present

## 2011-08-27 DIAGNOSIS — E1159 Type 2 diabetes mellitus with other circulatory complications: Secondary | ICD-10-CM | POA: Diagnosis not present

## 2011-09-02 DIAGNOSIS — R634 Abnormal weight loss: Secondary | ICD-10-CM | POA: Diagnosis not present

## 2011-09-02 DIAGNOSIS — R112 Nausea with vomiting, unspecified: Secondary | ICD-10-CM | POA: Diagnosis not present

## 2011-09-02 DIAGNOSIS — D649 Anemia, unspecified: Secondary | ICD-10-CM | POA: Diagnosis not present

## 2011-09-03 DIAGNOSIS — N186 End stage renal disease: Secondary | ICD-10-CM | POA: Diagnosis not present

## 2011-09-05 DIAGNOSIS — Z992 Dependence on renal dialysis: Secondary | ICD-10-CM | POA: Diagnosis not present

## 2011-09-05 DIAGNOSIS — N2581 Secondary hyperparathyroidism of renal origin: Secondary | ICD-10-CM | POA: Diagnosis not present

## 2011-09-05 DIAGNOSIS — N039 Chronic nephritic syndrome with unspecified morphologic changes: Secondary | ICD-10-CM | POA: Diagnosis not present

## 2011-09-05 DIAGNOSIS — N186 End stage renal disease: Secondary | ICD-10-CM | POA: Diagnosis not present

## 2011-09-05 DIAGNOSIS — D631 Anemia in chronic kidney disease: Secondary | ICD-10-CM | POA: Diagnosis not present

## 2011-09-05 DIAGNOSIS — D509 Iron deficiency anemia, unspecified: Secondary | ICD-10-CM | POA: Diagnosis not present

## 2011-09-08 DIAGNOSIS — N2581 Secondary hyperparathyroidism of renal origin: Secondary | ICD-10-CM | POA: Diagnosis not present

## 2011-09-08 DIAGNOSIS — Z992 Dependence on renal dialysis: Secondary | ICD-10-CM | POA: Diagnosis not present

## 2011-09-08 DIAGNOSIS — D631 Anemia in chronic kidney disease: Secondary | ICD-10-CM | POA: Diagnosis not present

## 2011-09-08 DIAGNOSIS — N186 End stage renal disease: Secondary | ICD-10-CM | POA: Diagnosis not present

## 2011-09-08 DIAGNOSIS — D509 Iron deficiency anemia, unspecified: Secondary | ICD-10-CM | POA: Diagnosis not present

## 2011-09-09 DIAGNOSIS — D649 Anemia, unspecified: Secondary | ICD-10-CM | POA: Diagnosis not present

## 2011-09-09 DIAGNOSIS — D128 Benign neoplasm of rectum: Secondary | ICD-10-CM | POA: Diagnosis not present

## 2011-09-09 DIAGNOSIS — D129 Benign neoplasm of anus and anal canal: Secondary | ICD-10-CM | POA: Diagnosis not present

## 2011-09-09 DIAGNOSIS — D126 Benign neoplasm of colon, unspecified: Secondary | ICD-10-CM | POA: Diagnosis not present

## 2011-09-09 DIAGNOSIS — R112 Nausea with vomiting, unspecified: Secondary | ICD-10-CM | POA: Diagnosis not present

## 2011-09-09 DIAGNOSIS — K319 Disease of stomach and duodenum, unspecified: Secondary | ICD-10-CM | POA: Diagnosis not present

## 2011-09-09 DIAGNOSIS — A048 Other specified bacterial intestinal infections: Secondary | ICD-10-CM | POA: Diagnosis not present

## 2011-09-10 DIAGNOSIS — N2581 Secondary hyperparathyroidism of renal origin: Secondary | ICD-10-CM | POA: Diagnosis not present

## 2011-09-10 DIAGNOSIS — D509 Iron deficiency anemia, unspecified: Secondary | ICD-10-CM | POA: Diagnosis not present

## 2011-09-10 DIAGNOSIS — N039 Chronic nephritic syndrome with unspecified morphologic changes: Secondary | ICD-10-CM | POA: Diagnosis not present

## 2011-09-10 DIAGNOSIS — Z992 Dependence on renal dialysis: Secondary | ICD-10-CM | POA: Diagnosis not present

## 2011-09-10 DIAGNOSIS — D631 Anemia in chronic kidney disease: Secondary | ICD-10-CM | POA: Diagnosis not present

## 2011-09-10 DIAGNOSIS — N186 End stage renal disease: Secondary | ICD-10-CM | POA: Diagnosis not present

## 2011-09-12 DIAGNOSIS — N039 Chronic nephritic syndrome with unspecified morphologic changes: Secondary | ICD-10-CM | POA: Diagnosis not present

## 2011-09-12 DIAGNOSIS — Z992 Dependence on renal dialysis: Secondary | ICD-10-CM | POA: Diagnosis not present

## 2011-09-12 DIAGNOSIS — D631 Anemia in chronic kidney disease: Secondary | ICD-10-CM | POA: Diagnosis not present

## 2011-09-12 DIAGNOSIS — N186 End stage renal disease: Secondary | ICD-10-CM | POA: Diagnosis not present

## 2011-09-12 DIAGNOSIS — D509 Iron deficiency anemia, unspecified: Secondary | ICD-10-CM | POA: Diagnosis not present

## 2011-09-12 DIAGNOSIS — N2581 Secondary hyperparathyroidism of renal origin: Secondary | ICD-10-CM | POA: Diagnosis not present

## 2011-09-15 DIAGNOSIS — Z992 Dependence on renal dialysis: Secondary | ICD-10-CM | POA: Diagnosis not present

## 2011-09-15 DIAGNOSIS — D631 Anemia in chronic kidney disease: Secondary | ICD-10-CM | POA: Diagnosis not present

## 2011-09-15 DIAGNOSIS — N2581 Secondary hyperparathyroidism of renal origin: Secondary | ICD-10-CM | POA: Diagnosis not present

## 2011-09-15 DIAGNOSIS — N186 End stage renal disease: Secondary | ICD-10-CM | POA: Diagnosis not present

## 2011-09-15 DIAGNOSIS — N039 Chronic nephritic syndrome with unspecified morphologic changes: Secondary | ICD-10-CM | POA: Diagnosis not present

## 2011-09-15 DIAGNOSIS — D509 Iron deficiency anemia, unspecified: Secondary | ICD-10-CM | POA: Diagnosis not present

## 2011-09-17 DIAGNOSIS — N186 End stage renal disease: Secondary | ICD-10-CM | POA: Diagnosis not present

## 2011-09-17 DIAGNOSIS — N2581 Secondary hyperparathyroidism of renal origin: Secondary | ICD-10-CM | POA: Diagnosis not present

## 2011-09-17 DIAGNOSIS — Z992 Dependence on renal dialysis: Secondary | ICD-10-CM | POA: Diagnosis not present

## 2011-09-17 DIAGNOSIS — D631 Anemia in chronic kidney disease: Secondary | ICD-10-CM | POA: Diagnosis not present

## 2011-09-17 DIAGNOSIS — D509 Iron deficiency anemia, unspecified: Secondary | ICD-10-CM | POA: Diagnosis not present

## 2011-09-19 DIAGNOSIS — N2581 Secondary hyperparathyroidism of renal origin: Secondary | ICD-10-CM | POA: Diagnosis not present

## 2011-09-19 DIAGNOSIS — N186 End stage renal disease: Secondary | ICD-10-CM | POA: Diagnosis not present

## 2011-09-19 DIAGNOSIS — D509 Iron deficiency anemia, unspecified: Secondary | ICD-10-CM | POA: Diagnosis not present

## 2011-09-19 DIAGNOSIS — N039 Chronic nephritic syndrome with unspecified morphologic changes: Secondary | ICD-10-CM | POA: Diagnosis not present

## 2011-09-19 DIAGNOSIS — Z992 Dependence on renal dialysis: Secondary | ICD-10-CM | POA: Diagnosis not present

## 2011-09-19 DIAGNOSIS — D631 Anemia in chronic kidney disease: Secondary | ICD-10-CM | POA: Diagnosis not present

## 2011-09-22 DIAGNOSIS — D509 Iron deficiency anemia, unspecified: Secondary | ICD-10-CM | POA: Diagnosis not present

## 2011-09-22 DIAGNOSIS — N186 End stage renal disease: Secondary | ICD-10-CM | POA: Diagnosis not present

## 2011-09-22 DIAGNOSIS — D631 Anemia in chronic kidney disease: Secondary | ICD-10-CM | POA: Diagnosis not present

## 2011-09-22 DIAGNOSIS — N2581 Secondary hyperparathyroidism of renal origin: Secondary | ICD-10-CM | POA: Diagnosis not present

## 2011-09-22 DIAGNOSIS — N039 Chronic nephritic syndrome with unspecified morphologic changes: Secondary | ICD-10-CM | POA: Diagnosis not present

## 2011-09-22 DIAGNOSIS — Z992 Dependence on renal dialysis: Secondary | ICD-10-CM | POA: Diagnosis not present

## 2011-09-24 DIAGNOSIS — D631 Anemia in chronic kidney disease: Secondary | ICD-10-CM | POA: Diagnosis not present

## 2011-09-24 DIAGNOSIS — Z992 Dependence on renal dialysis: Secondary | ICD-10-CM | POA: Diagnosis not present

## 2011-09-24 DIAGNOSIS — D509 Iron deficiency anemia, unspecified: Secondary | ICD-10-CM | POA: Diagnosis not present

## 2011-09-24 DIAGNOSIS — N2581 Secondary hyperparathyroidism of renal origin: Secondary | ICD-10-CM | POA: Diagnosis not present

## 2011-09-24 DIAGNOSIS — N186 End stage renal disease: Secondary | ICD-10-CM | POA: Diagnosis not present

## 2011-09-24 DIAGNOSIS — N039 Chronic nephritic syndrome with unspecified morphologic changes: Secondary | ICD-10-CM | POA: Diagnosis not present

## 2011-09-26 DIAGNOSIS — D509 Iron deficiency anemia, unspecified: Secondary | ICD-10-CM | POA: Diagnosis not present

## 2011-09-26 DIAGNOSIS — N2581 Secondary hyperparathyroidism of renal origin: Secondary | ICD-10-CM | POA: Diagnosis not present

## 2011-09-26 DIAGNOSIS — D631 Anemia in chronic kidney disease: Secondary | ICD-10-CM | POA: Diagnosis not present

## 2011-09-26 DIAGNOSIS — N186 End stage renal disease: Secondary | ICD-10-CM | POA: Diagnosis not present

## 2011-09-26 DIAGNOSIS — Z992 Dependence on renal dialysis: Secondary | ICD-10-CM | POA: Diagnosis not present

## 2011-09-29 DIAGNOSIS — Z992 Dependence on renal dialysis: Secondary | ICD-10-CM | POA: Diagnosis not present

## 2011-09-29 DIAGNOSIS — D631 Anemia in chronic kidney disease: Secondary | ICD-10-CM | POA: Diagnosis not present

## 2011-09-29 DIAGNOSIS — N039 Chronic nephritic syndrome with unspecified morphologic changes: Secondary | ICD-10-CM | POA: Diagnosis not present

## 2011-09-29 DIAGNOSIS — N2581 Secondary hyperparathyroidism of renal origin: Secondary | ICD-10-CM | POA: Diagnosis not present

## 2011-09-29 DIAGNOSIS — N186 End stage renal disease: Secondary | ICD-10-CM | POA: Diagnosis not present

## 2011-09-29 DIAGNOSIS — D509 Iron deficiency anemia, unspecified: Secondary | ICD-10-CM | POA: Diagnosis not present

## 2011-10-01 DIAGNOSIS — Z992 Dependence on renal dialysis: Secondary | ICD-10-CM | POA: Diagnosis not present

## 2011-10-01 DIAGNOSIS — D631 Anemia in chronic kidney disease: Secondary | ICD-10-CM | POA: Diagnosis not present

## 2011-10-01 DIAGNOSIS — D509 Iron deficiency anemia, unspecified: Secondary | ICD-10-CM | POA: Diagnosis not present

## 2011-10-01 DIAGNOSIS — N186 End stage renal disease: Secondary | ICD-10-CM | POA: Diagnosis not present

## 2011-10-01 DIAGNOSIS — N039 Chronic nephritic syndrome with unspecified morphologic changes: Secondary | ICD-10-CM | POA: Diagnosis not present

## 2011-10-01 DIAGNOSIS — N2581 Secondary hyperparathyroidism of renal origin: Secondary | ICD-10-CM | POA: Diagnosis not present

## 2011-10-03 DIAGNOSIS — D631 Anemia in chronic kidney disease: Secondary | ICD-10-CM | POA: Diagnosis not present

## 2011-10-03 DIAGNOSIS — N039 Chronic nephritic syndrome with unspecified morphologic changes: Secondary | ICD-10-CM | POA: Diagnosis not present

## 2011-10-03 DIAGNOSIS — Z992 Dependence on renal dialysis: Secondary | ICD-10-CM | POA: Diagnosis not present

## 2011-10-03 DIAGNOSIS — D509 Iron deficiency anemia, unspecified: Secondary | ICD-10-CM | POA: Diagnosis not present

## 2011-10-03 DIAGNOSIS — N186 End stage renal disease: Secondary | ICD-10-CM | POA: Diagnosis not present

## 2011-10-03 DIAGNOSIS — N2581 Secondary hyperparathyroidism of renal origin: Secondary | ICD-10-CM | POA: Diagnosis not present

## 2011-10-04 DIAGNOSIS — N186 End stage renal disease: Secondary | ICD-10-CM | POA: Diagnosis not present

## 2011-10-06 DIAGNOSIS — N186 End stage renal disease: Secondary | ICD-10-CM | POA: Diagnosis not present

## 2011-10-06 DIAGNOSIS — D631 Anemia in chronic kidney disease: Secondary | ICD-10-CM | POA: Diagnosis not present

## 2011-10-06 DIAGNOSIS — Z992 Dependence on renal dialysis: Secondary | ICD-10-CM | POA: Diagnosis not present

## 2011-10-06 DIAGNOSIS — Z23 Encounter for immunization: Secondary | ICD-10-CM | POA: Diagnosis not present

## 2011-10-06 DIAGNOSIS — D509 Iron deficiency anemia, unspecified: Secondary | ICD-10-CM | POA: Diagnosis not present

## 2011-10-06 DIAGNOSIS — N2581 Secondary hyperparathyroidism of renal origin: Secondary | ICD-10-CM | POA: Diagnosis not present

## 2011-10-22 ENCOUNTER — Other Ambulatory Visit (HOSPITAL_COMMUNITY): Payer: Self-pay | Admitting: Gastroenterology

## 2011-10-22 DIAGNOSIS — A048 Other specified bacterial intestinal infections: Secondary | ICD-10-CM | POA: Diagnosis not present

## 2011-10-22 DIAGNOSIS — R112 Nausea with vomiting, unspecified: Secondary | ICD-10-CM | POA: Diagnosis not present

## 2011-10-22 DIAGNOSIS — R109 Unspecified abdominal pain: Secondary | ICD-10-CM

## 2011-10-30 ENCOUNTER — Encounter (HOSPITAL_COMMUNITY)
Admission: RE | Admit: 2011-10-30 | Discharge: 2011-10-30 | Disposition: A | Discharge status: Home / Self Care | Payer: Medicare Other | Source: Ambulatory Visit | Attending: Gastroenterology | Admitting: Gastroenterology

## 2011-10-30 DIAGNOSIS — R112 Nausea with vomiting, unspecified: Secondary | ICD-10-CM | POA: Diagnosis not present

## 2011-10-30 DIAGNOSIS — R109 Unspecified abdominal pain: Secondary | ICD-10-CM | POA: Diagnosis not present

## 2011-10-30 MED ORDER — TECHNETIUM TC 99M SULFUR COLLOID
2.0000 | Freq: Once | INTRAVENOUS | Status: AC | PRN
  Administered 2011-10-30: 2 via ORAL

## 2011-11-03 DIAGNOSIS — N186 End stage renal disease: Secondary | ICD-10-CM | POA: Diagnosis not present

## 2011-11-05 DIAGNOSIS — N039 Chronic nephritic syndrome with unspecified morphologic changes: Secondary | ICD-10-CM | POA: Diagnosis not present

## 2011-11-05 DIAGNOSIS — Z992 Dependence on renal dialysis: Secondary | ICD-10-CM | POA: Diagnosis not present

## 2011-11-05 DIAGNOSIS — N186 End stage renal disease: Secondary | ICD-10-CM | POA: Diagnosis not present

## 2011-11-05 DIAGNOSIS — D509 Iron deficiency anemia, unspecified: Secondary | ICD-10-CM | POA: Diagnosis not present

## 2011-11-05 DIAGNOSIS — N2581 Secondary hyperparathyroidism of renal origin: Secondary | ICD-10-CM | POA: Diagnosis not present

## 2011-11-05 DIAGNOSIS — D631 Anemia in chronic kidney disease: Secondary | ICD-10-CM | POA: Diagnosis not present

## 2011-11-07 DIAGNOSIS — D509 Iron deficiency anemia, unspecified: Secondary | ICD-10-CM | POA: Diagnosis not present

## 2011-11-07 DIAGNOSIS — N186 End stage renal disease: Secondary | ICD-10-CM | POA: Diagnosis not present

## 2011-11-07 DIAGNOSIS — D631 Anemia in chronic kidney disease: Secondary | ICD-10-CM | POA: Diagnosis not present

## 2011-11-07 DIAGNOSIS — N039 Chronic nephritic syndrome with unspecified morphologic changes: Secondary | ICD-10-CM | POA: Diagnosis not present

## 2011-11-07 DIAGNOSIS — N2581 Secondary hyperparathyroidism of renal origin: Secondary | ICD-10-CM | POA: Diagnosis not present

## 2011-11-07 DIAGNOSIS — Z992 Dependence on renal dialysis: Secondary | ICD-10-CM | POA: Diagnosis not present

## 2011-11-10 DIAGNOSIS — D631 Anemia in chronic kidney disease: Secondary | ICD-10-CM | POA: Diagnosis not present

## 2011-11-10 DIAGNOSIS — D509 Iron deficiency anemia, unspecified: Secondary | ICD-10-CM | POA: Diagnosis not present

## 2011-11-10 DIAGNOSIS — N2581 Secondary hyperparathyroidism of renal origin: Secondary | ICD-10-CM | POA: Diagnosis not present

## 2011-11-10 DIAGNOSIS — Z992 Dependence on renal dialysis: Secondary | ICD-10-CM | POA: Diagnosis not present

## 2011-11-10 DIAGNOSIS — N186 End stage renal disease: Secondary | ICD-10-CM | POA: Diagnosis not present

## 2011-11-11 ENCOUNTER — Other Ambulatory Visit: Payer: Self-pay | Admitting: Gastroenterology

## 2011-11-11 DIAGNOSIS — A048 Other specified bacterial intestinal infections: Secondary | ICD-10-CM | POA: Diagnosis not present

## 2011-11-11 DIAGNOSIS — R109 Unspecified abdominal pain: Secondary | ICD-10-CM

## 2011-11-11 DIAGNOSIS — R112 Nausea with vomiting, unspecified: Secondary | ICD-10-CM | POA: Diagnosis not present

## 2011-11-12 DIAGNOSIS — D509 Iron deficiency anemia, unspecified: Secondary | ICD-10-CM | POA: Diagnosis not present

## 2011-11-12 DIAGNOSIS — Z992 Dependence on renal dialysis: Secondary | ICD-10-CM | POA: Diagnosis not present

## 2011-11-12 DIAGNOSIS — N186 End stage renal disease: Secondary | ICD-10-CM | POA: Diagnosis not present

## 2011-11-12 DIAGNOSIS — N2581 Secondary hyperparathyroidism of renal origin: Secondary | ICD-10-CM | POA: Diagnosis not present

## 2011-11-12 DIAGNOSIS — D631 Anemia in chronic kidney disease: Secondary | ICD-10-CM | POA: Diagnosis not present

## 2011-11-13 ENCOUNTER — Ambulatory Visit
Admission: RE | Admit: 2011-11-13 | Discharge: 2011-11-13 | Disposition: A | Discharge status: Home / Self Care | Payer: Medicare Other | Source: Ambulatory Visit | Attending: Gastroenterology | Admitting: Gastroenterology

## 2011-11-13 DIAGNOSIS — R109 Unspecified abdominal pain: Secondary | ICD-10-CM

## 2011-11-13 DIAGNOSIS — R188 Other ascites: Secondary | ICD-10-CM | POA: Diagnosis not present

## 2011-11-13 DIAGNOSIS — R160 Hepatomegaly, not elsewhere classified: Secondary | ICD-10-CM | POA: Diagnosis not present

## 2011-11-14 DIAGNOSIS — D631 Anemia in chronic kidney disease: Secondary | ICD-10-CM | POA: Diagnosis not present

## 2011-11-14 DIAGNOSIS — N186 End stage renal disease: Secondary | ICD-10-CM | POA: Diagnosis not present

## 2011-11-14 DIAGNOSIS — Z992 Dependence on renal dialysis: Secondary | ICD-10-CM | POA: Diagnosis not present

## 2011-11-14 DIAGNOSIS — N039 Chronic nephritic syndrome with unspecified morphologic changes: Secondary | ICD-10-CM | POA: Diagnosis not present

## 2011-11-14 DIAGNOSIS — D509 Iron deficiency anemia, unspecified: Secondary | ICD-10-CM | POA: Diagnosis not present

## 2011-11-14 DIAGNOSIS — N2581 Secondary hyperparathyroidism of renal origin: Secondary | ICD-10-CM | POA: Diagnosis not present

## 2011-11-17 DIAGNOSIS — Z992 Dependence on renal dialysis: Secondary | ICD-10-CM | POA: Diagnosis not present

## 2011-11-17 DIAGNOSIS — N186 End stage renal disease: Secondary | ICD-10-CM | POA: Diagnosis not present

## 2011-11-17 DIAGNOSIS — D631 Anemia in chronic kidney disease: Secondary | ICD-10-CM | POA: Diagnosis not present

## 2011-11-17 DIAGNOSIS — D509 Iron deficiency anemia, unspecified: Secondary | ICD-10-CM | POA: Diagnosis not present

## 2011-11-17 DIAGNOSIS — N2581 Secondary hyperparathyroidism of renal origin: Secondary | ICD-10-CM | POA: Diagnosis not present

## 2011-11-19 DIAGNOSIS — N2581 Secondary hyperparathyroidism of renal origin: Secondary | ICD-10-CM | POA: Diagnosis not present

## 2011-11-19 DIAGNOSIS — D509 Iron deficiency anemia, unspecified: Secondary | ICD-10-CM | POA: Diagnosis not present

## 2011-11-19 DIAGNOSIS — N186 End stage renal disease: Secondary | ICD-10-CM | POA: Diagnosis not present

## 2011-11-19 DIAGNOSIS — Z992 Dependence on renal dialysis: Secondary | ICD-10-CM | POA: Diagnosis not present

## 2011-11-19 DIAGNOSIS — N039 Chronic nephritic syndrome with unspecified morphologic changes: Secondary | ICD-10-CM | POA: Diagnosis not present

## 2011-11-19 DIAGNOSIS — D631 Anemia in chronic kidney disease: Secondary | ICD-10-CM | POA: Diagnosis not present

## 2011-11-21 DIAGNOSIS — N2581 Secondary hyperparathyroidism of renal origin: Secondary | ICD-10-CM | POA: Diagnosis not present

## 2011-11-21 DIAGNOSIS — N186 End stage renal disease: Secondary | ICD-10-CM | POA: Diagnosis not present

## 2011-11-21 DIAGNOSIS — N039 Chronic nephritic syndrome with unspecified morphologic changes: Secondary | ICD-10-CM | POA: Diagnosis not present

## 2011-11-21 DIAGNOSIS — Z992 Dependence on renal dialysis: Secondary | ICD-10-CM | POA: Diagnosis not present

## 2011-11-21 DIAGNOSIS — D631 Anemia in chronic kidney disease: Secondary | ICD-10-CM | POA: Diagnosis not present

## 2011-11-21 DIAGNOSIS — D509 Iron deficiency anemia, unspecified: Secondary | ICD-10-CM | POA: Diagnosis not present

## 2011-11-24 DIAGNOSIS — N186 End stage renal disease: Secondary | ICD-10-CM | POA: Diagnosis not present

## 2011-11-24 DIAGNOSIS — D631 Anemia in chronic kidney disease: Secondary | ICD-10-CM | POA: Diagnosis not present

## 2011-11-24 DIAGNOSIS — D509 Iron deficiency anemia, unspecified: Secondary | ICD-10-CM | POA: Diagnosis not present

## 2011-11-24 DIAGNOSIS — Z992 Dependence on renal dialysis: Secondary | ICD-10-CM | POA: Diagnosis not present

## 2011-11-24 DIAGNOSIS — N2581 Secondary hyperparathyroidism of renal origin: Secondary | ICD-10-CM | POA: Diagnosis not present

## 2011-11-26 DIAGNOSIS — E1159 Type 2 diabetes mellitus with other circulatory complications: Secondary | ICD-10-CM | POA: Diagnosis not present

## 2011-11-26 DIAGNOSIS — N2581 Secondary hyperparathyroidism of renal origin: Secondary | ICD-10-CM | POA: Diagnosis not present

## 2011-11-26 DIAGNOSIS — Z992 Dependence on renal dialysis: Secondary | ICD-10-CM | POA: Diagnosis not present

## 2011-11-26 DIAGNOSIS — D631 Anemia in chronic kidney disease: Secondary | ICD-10-CM | POA: Diagnosis not present

## 2011-11-26 DIAGNOSIS — N186 End stage renal disease: Secondary | ICD-10-CM | POA: Diagnosis not present

## 2011-11-26 DIAGNOSIS — D509 Iron deficiency anemia, unspecified: Secondary | ICD-10-CM | POA: Diagnosis not present

## 2011-11-28 DIAGNOSIS — Z992 Dependence on renal dialysis: Secondary | ICD-10-CM | POA: Diagnosis not present

## 2011-11-28 DIAGNOSIS — D631 Anemia in chronic kidney disease: Secondary | ICD-10-CM | POA: Diagnosis not present

## 2011-11-28 DIAGNOSIS — N2581 Secondary hyperparathyroidism of renal origin: Secondary | ICD-10-CM | POA: Diagnosis not present

## 2011-11-28 DIAGNOSIS — N186 End stage renal disease: Secondary | ICD-10-CM | POA: Diagnosis not present

## 2011-11-28 DIAGNOSIS — N039 Chronic nephritic syndrome with unspecified morphologic changes: Secondary | ICD-10-CM | POA: Diagnosis not present

## 2011-11-28 DIAGNOSIS — D509 Iron deficiency anemia, unspecified: Secondary | ICD-10-CM | POA: Diagnosis not present

## 2011-12-01 DIAGNOSIS — Z992 Dependence on renal dialysis: Secondary | ICD-10-CM | POA: Diagnosis not present

## 2011-12-01 DIAGNOSIS — D631 Anemia in chronic kidney disease: Secondary | ICD-10-CM | POA: Diagnosis not present

## 2011-12-01 DIAGNOSIS — N186 End stage renal disease: Secondary | ICD-10-CM | POA: Diagnosis not present

## 2011-12-01 DIAGNOSIS — D509 Iron deficiency anemia, unspecified: Secondary | ICD-10-CM | POA: Diagnosis not present

## 2011-12-01 DIAGNOSIS — N2581 Secondary hyperparathyroidism of renal origin: Secondary | ICD-10-CM | POA: Diagnosis not present

## 2011-12-03 DIAGNOSIS — N2581 Secondary hyperparathyroidism of renal origin: Secondary | ICD-10-CM | POA: Diagnosis not present

## 2011-12-03 DIAGNOSIS — Z992 Dependence on renal dialysis: Secondary | ICD-10-CM | POA: Diagnosis not present

## 2011-12-03 DIAGNOSIS — D631 Anemia in chronic kidney disease: Secondary | ICD-10-CM | POA: Diagnosis not present

## 2011-12-03 DIAGNOSIS — N186 End stage renal disease: Secondary | ICD-10-CM | POA: Diagnosis not present

## 2011-12-03 DIAGNOSIS — D509 Iron deficiency anemia, unspecified: Secondary | ICD-10-CM | POA: Diagnosis not present

## 2011-12-04 DIAGNOSIS — N186 End stage renal disease: Secondary | ICD-10-CM | POA: Diagnosis not present

## 2011-12-05 DIAGNOSIS — D631 Anemia in chronic kidney disease: Secondary | ICD-10-CM | POA: Diagnosis not present

## 2011-12-05 DIAGNOSIS — D509 Iron deficiency anemia, unspecified: Secondary | ICD-10-CM | POA: Diagnosis not present

## 2011-12-05 DIAGNOSIS — Z992 Dependence on renal dialysis: Secondary | ICD-10-CM | POA: Diagnosis not present

## 2011-12-05 DIAGNOSIS — N2581 Secondary hyperparathyroidism of renal origin: Secondary | ICD-10-CM | POA: Diagnosis not present

## 2011-12-05 DIAGNOSIS — N186 End stage renal disease: Secondary | ICD-10-CM | POA: Diagnosis not present

## 2011-12-12 ENCOUNTER — Ambulatory Visit
Admission: RE | Admit: 2011-12-12 | Discharge: 2011-12-12 | Disposition: A | Discharge status: Home / Self Care | Payer: Medicare Other | Source: Ambulatory Visit | Attending: Gastroenterology | Admitting: Gastroenterology

## 2011-12-12 ENCOUNTER — Other Ambulatory Visit: Payer: Self-pay | Admitting: Gastroenterology

## 2011-12-12 DIAGNOSIS — R109 Unspecified abdominal pain: Secondary | ICD-10-CM | POA: Diagnosis not present

## 2011-12-25 DIAGNOSIS — R143 Flatulence: Secondary | ICD-10-CM | POA: Diagnosis not present

## 2011-12-25 DIAGNOSIS — R160 Hepatomegaly, not elsewhere classified: Secondary | ICD-10-CM | POA: Diagnosis not present

## 2011-12-25 DIAGNOSIS — R141 Gas pain: Secondary | ICD-10-CM | POA: Diagnosis not present

## 2011-12-25 DIAGNOSIS — R112 Nausea with vomiting, unspecified: Secondary | ICD-10-CM | POA: Diagnosis not present

## 2012-01-03 DIAGNOSIS — N186 End stage renal disease: Secondary | ICD-10-CM | POA: Diagnosis not present

## 2012-01-05 DIAGNOSIS — N186 End stage renal disease: Secondary | ICD-10-CM | POA: Diagnosis not present

## 2012-01-05 DIAGNOSIS — N2581 Secondary hyperparathyroidism of renal origin: Secondary | ICD-10-CM | POA: Diagnosis not present

## 2012-01-05 DIAGNOSIS — D631 Anemia in chronic kidney disease: Secondary | ICD-10-CM | POA: Diagnosis not present

## 2012-01-05 DIAGNOSIS — D509 Iron deficiency anemia, unspecified: Secondary | ICD-10-CM | POA: Diagnosis not present

## 2012-01-05 DIAGNOSIS — Z992 Dependence on renal dialysis: Secondary | ICD-10-CM | POA: Diagnosis not present

## 2012-02-03 DIAGNOSIS — N186 End stage renal disease: Secondary | ICD-10-CM | POA: Diagnosis not present

## 2012-02-06 DIAGNOSIS — D631 Anemia in chronic kidney disease: Secondary | ICD-10-CM | POA: Diagnosis not present

## 2012-02-06 DIAGNOSIS — D509 Iron deficiency anemia, unspecified: Secondary | ICD-10-CM | POA: Diagnosis not present

## 2012-02-06 DIAGNOSIS — N186 End stage renal disease: Secondary | ICD-10-CM | POA: Diagnosis not present

## 2012-02-06 DIAGNOSIS — N2581 Secondary hyperparathyroidism of renal origin: Secondary | ICD-10-CM | POA: Diagnosis not present

## 2012-02-06 DIAGNOSIS — Z992 Dependence on renal dialysis: Secondary | ICD-10-CM | POA: Diagnosis not present

## 2012-02-25 DIAGNOSIS — E1159 Type 2 diabetes mellitus with other circulatory complications: Secondary | ICD-10-CM | POA: Diagnosis not present

## 2012-03-05 DIAGNOSIS — N186 End stage renal disease: Secondary | ICD-10-CM | POA: Diagnosis not present

## 2012-03-08 DIAGNOSIS — N2581 Secondary hyperparathyroidism of renal origin: Secondary | ICD-10-CM | POA: Diagnosis not present

## 2012-03-08 DIAGNOSIS — D509 Iron deficiency anemia, unspecified: Secondary | ICD-10-CM | POA: Diagnosis not present

## 2012-03-08 DIAGNOSIS — Z992 Dependence on renal dialysis: Secondary | ICD-10-CM | POA: Diagnosis not present

## 2012-03-08 DIAGNOSIS — D631 Anemia in chronic kidney disease: Secondary | ICD-10-CM | POA: Diagnosis not present

## 2012-03-08 DIAGNOSIS — N186 End stage renal disease: Secondary | ICD-10-CM | POA: Diagnosis not present

## 2012-03-23 ENCOUNTER — Other Ambulatory Visit: Payer: Self-pay | Admitting: *Deleted

## 2012-03-23 DIAGNOSIS — T82598A Other mechanical complication of other cardiac and vascular devices and implants, initial encounter: Secondary | ICD-10-CM

## 2012-03-28 ENCOUNTER — Emergency Department (HOSPITAL_COMMUNITY)
Admission: EM | Admit: 2012-03-28 | Discharge: 2012-03-28 | Disposition: A | Discharge status: Home / Self Care | Payer: Medicare Other | Attending: Emergency Medicine | Admitting: Emergency Medicine

## 2012-03-28 ENCOUNTER — Emergency Department (HOSPITAL_COMMUNITY): Payer: Medicare Other

## 2012-03-28 ENCOUNTER — Encounter (HOSPITAL_COMMUNITY): Payer: Self-pay | Admitting: Physical Medicine and Rehabilitation

## 2012-03-28 DIAGNOSIS — F17200 Nicotine dependence, unspecified, uncomplicated: Secondary | ICD-10-CM | POA: Diagnosis not present

## 2012-03-28 DIAGNOSIS — Z862 Personal history of diseases of the blood and blood-forming organs and certain disorders involving the immune mechanism: Secondary | ICD-10-CM | POA: Diagnosis not present

## 2012-03-28 DIAGNOSIS — R1013 Epigastric pain: Secondary | ICD-10-CM | POA: Diagnosis not present

## 2012-03-28 DIAGNOSIS — I129 Hypertensive chronic kidney disease with stage 1 through stage 4 chronic kidney disease, or unspecified chronic kidney disease: Secondary | ICD-10-CM | POA: Insufficient documentation

## 2012-03-28 DIAGNOSIS — N189 Chronic kidney disease, unspecified: Secondary | ICD-10-CM | POA: Insufficient documentation

## 2012-03-28 DIAGNOSIS — N2581 Secondary hyperparathyroidism of renal origin: Secondary | ICD-10-CM | POA: Diagnosis not present

## 2012-03-28 DIAGNOSIS — M109 Gout, unspecified: Secondary | ICD-10-CM | POA: Diagnosis not present

## 2012-03-28 DIAGNOSIS — J4489 Other specified chronic obstructive pulmonary disease: Secondary | ICD-10-CM | POA: Insufficient documentation

## 2012-03-28 DIAGNOSIS — K625 Hemorrhage of anus and rectum: Secondary | ICD-10-CM | POA: Insufficient documentation

## 2012-03-28 DIAGNOSIS — J449 Chronic obstructive pulmonary disease, unspecified: Secondary | ICD-10-CM | POA: Diagnosis not present

## 2012-03-28 DIAGNOSIS — R112 Nausea with vomiting, unspecified: Secondary | ICD-10-CM | POA: Diagnosis not present

## 2012-03-28 DIAGNOSIS — R1011 Right upper quadrant pain: Secondary | ICD-10-CM | POA: Diagnosis not present

## 2012-03-28 DIAGNOSIS — R109 Unspecified abdominal pain: Secondary | ICD-10-CM | POA: Diagnosis not present

## 2012-03-28 DIAGNOSIS — Z79899 Other long term (current) drug therapy: Secondary | ICD-10-CM | POA: Diagnosis not present

## 2012-03-28 DIAGNOSIS — K921 Melena: Secondary | ICD-10-CM | POA: Diagnosis not present

## 2012-03-28 LAB — COMPREHENSIVE METABOLIC PANEL
ALT: <5 U/L (ref 0–53)
AST: 7 U/L (ref 0–37)
Albumin: 3.5 g/dL (ref 3.5–5.2)
CO2: 26 mEq/L (ref 19–32)
Calcium: 11.1 mg/dL — ABNORMAL HIGH (ref 8.4–10.5)
Creatinine, Ser: 10.85 mg/dL — ABNORMAL HIGH (ref 0.50–1.35)
GFR calc non Af Amer: 5 mL/min — ABNORMAL LOW (ref >90)
Sodium: 137 mEq/L (ref 135–145)
Total Protein: 8.1 g/dL (ref 6.0–8.3)

## 2012-03-28 LAB — CBC WITH DIFFERENTIAL/PLATELET
Basophils Absolute: 0 10*3/uL (ref 0.0–0.1)
Basophils Relative: 0 % (ref 0–1)
Eosinophils Relative: 5 % (ref 0–5)
Lymphocytes Relative: 23 % (ref 12–46)
MCHC: 34.3 g/dL (ref 30.0–36.0)
MCV: 94.5 fL (ref 78.0–100.0)
Neutro Abs: 4.7 10*3/uL (ref 1.7–7.7)
Platelets: 251 10*3/uL (ref 150–400)
RDW: 13.9 % (ref 11.5–15.5)
WBC: 7.2 10*3/uL (ref 4.0–10.5)

## 2012-03-28 LAB — PROTIME-INR: INR: 1.14 (ref 0.00–1.49)

## 2012-03-28 LAB — LIPASE, BLOOD: Lipase: 36 U/L (ref 11–59)

## 2012-03-28 MED ORDER — ONDANSETRON HCL 4 MG/2ML IJ SOLN
4.0000 mg | Freq: Once | INTRAMUSCULAR | Status: AC
  Administered 2012-03-28: 4 mg via INTRAVENOUS
  Filled 2012-03-28: qty 2

## 2012-03-28 MED ORDER — SODIUM CHLORIDE 0.9 % IV SOLN
INTRAVENOUS | Status: DC
  Administered 2012-03-28: 11:00:00 via INTRAVENOUS

## 2012-03-28 MED ORDER — TRAMADOL HCL 50 MG PO TABS: 50.0000 mg | ORAL_TABLET | Freq: Four times a day (QID) | ORAL | Status: DC | PRN

## 2012-03-28 NOTE — ED Provider Notes (Signed)
History     CSN: [id number]  Arrival date & time 03/28/12  6644   First MD Initiated Contact with Patient 03/28/12 1015      Chief Complaint  Patient presents with  . Abdominal Pain  . Rectal Bleeding    (Consider location/radiation/quality/duration/timing/severity/associated sxs/prior treatment) HPI Comments: Patient comes to the ER for evaluation of abdominal pain. Patient reports that he has been having right upper abdominal discomfort for one to 2 weeks. Patient reports a previous history of similar pain, worked up by Dr. Paulita Fujita with EGD and colonoscopy. Patient reports that he was put on medication and also had an ultrasound which showed an enlarged liver. Patient indicates that he was doing well for the last couple of months, then started having pain, nausea and vomiting again. 2 days ago he had a bowel movement and noticed blood on the tissue when he wiped. He then noticed that there was blood in the toilet as well. He has been more bowel movement that had bled, but lessened has not had any bleeding since.  Patient is a 47 y.o. male presenting with abdominal pain and hematochezia.  Abdominal Pain Associated symptoms: hematochezia, nausea and vomiting   Associated symptoms: no chest pain and no shortness of breath   Rectal Bleeding  Associated symptoms include abdominal pain, nausea and vomiting. Pertinent negatives include no chest pain.    Past Medical History  Diagnosis Date  . Chronic kidney disease   . Hypertension   . COPD (chronic obstructive pulmonary disease)   . Arthritis     GOUT  . Anemia   . Secondary hyperparathyroidism     Past Surgical History  Procedure Laterality Date  . Av fistula placement  2009    Left lower arm AVF    History reviewed. No pertinent family history.  History  Substance Use Topics  . Smoking status: Current Every Day Smoker -- 0.50 packs/day    Types: Cigarettes  . Smokeless tobacco: Not on file  . Alcohol Use: Yes      Comment: occassional      Review of Systems  Respiratory: Negative for shortness of breath.   Cardiovascular: Negative for chest pain.  Gastrointestinal: Positive for nausea, vomiting, abdominal pain, hematochezia and anal bleeding.  All other systems reviewed and are negative.    Allergies  Aspirin  Home Medications   Current Outpatient Rx  Name  Route  Sig  Dispense  Refill  . allopurinol (ZYLOPRIM) 100 MG tablet   Oral   Take 100 mg by mouth daily.         Marland Kitchen amLODipine-valsartan (EXFORGE) 10-320 MG per tablet   Oral   Take 1 tablet by mouth daily.         . Calcium Acetate, Phos Binder, (PHOSLO PO)   Oral   Take 1,000 mg by mouth.         . cloNIDine (CATAPRES - DOSED IN MG/24 HR) 0.3 mg/24hr   Transdermal   Place 1 patch onto the skin once a week.         Marland Kitchen doxercalciferol (HECTOROL) 0.5 MCG capsule   Oral   Take 1 mcg by mouth daily.         . furosemide (LASIX) 80 MG tablet   Oral   Take 80 mg by mouth 2 (two) times daily.         Marland Kitchen labetalol (NORMODYNE) 300 MG tablet   Oral   Take 300 mg by mouth 2 (two) times daily.         Marland Kitchen  lisinopril (PRINIVIL,ZESTRIL) 40 MG tablet   Oral   Take 40 mg by mouth daily. Take 2 tablets daily.           BP 190/119  Pulse 90  Temp(Src) 98 F (36.7 C) (Oral)  SpO2 97%  Physical Exam  Constitutional: He is oriented to person, place, and time. He appears well-developed and well-nourished. No distress.  HENT:  Head: Normocephalic and atraumatic.  Right Ear: Hearing normal.  Nose: Nose normal.  Mouth/Throat: Oropharynx is clear and moist and mucous membranes are normal.  Eyes: Conjunctivae and EOM are normal. Pupils are equal, round, and reactive to light.  Neck: Normal range of motion. Neck supple.  Cardiovascular: Normal rate, regular rhythm, S1 normal and S2 normal.  Exam reveals no gallop and no friction rub.   No murmur heard. Pulmonary/Chest: Effort normal and breath sounds normal. No  respiratory distress. He exhibits no tenderness.  Abdominal: Soft. Normal appearance and bowel sounds are normal. There is no hepatosplenomegaly. There is tenderness in the right upper quadrant and epigastric area. There is no rebound, no guarding, no tenderness at McBurney's point and negative Murphy's sign. No hernia.  Musculoskeletal: Normal range of motion.  Neurological: He is alert and oriented to person, place, and time. He has normal strength. No cranial nerve deficit or sensory deficit. Coordination normal. GCS eye subscore is 4. GCS verbal subscore is 5. GCS motor subscore is 6.  Skin: Skin is warm, dry and intact. No rash noted. No cyanosis.  Psychiatric: He has a normal mood and affect. His speech is normal and behavior is normal. Thought content normal.    ED Course  Procedures (including critical care time)  Labs Reviewed  CBC WITH DIFFERENTIAL - Abnormal; Notable for the following:    RBC 3.83 (*)    Hemoglobin 12.4 (*)    HCT 36.2 (*)    All other components within normal limits  COMPREHENSIVE METABOLIC PANEL - Abnormal; Notable for the following:    Chloride 93 (*)    BUN 44 (*)    Creatinine, Ser 10.85 (*)    Calcium 11.1 (*)    GFR calc non Af Amer 5 (*)    GFR calc Af Amer 6 (*)    All other components within normal limits  PROTIME-INR  LIPASE, BLOOD   Dg Abd Acute W/chest  03/28/2012  *RADIOLOGY REPORT*  Clinical Data: Abdominal pain with rectal bleeding.  Nausea vomiting.  ACUTE ABDOMEN SERIES (ABDOMEN 2 VIEW & CHEST 1 VIEW)  Comparison: 12/12/2011 and the chest film of 02/22/2007  Findings: Frontal view of the chest demonstrates midline trachea. Moderate cardiomegaly. No pleural effusion or pneumothorax. Interstitial thickening is chronic.  Mild pulmonary venous congestion cannot be excluded.  There are similar right greater than left bibasilar opacities since 02/22/2007.  Abominal films demonstrate no free intraperitoneal air or significant air fluid levels on  upright positioning.  No bowel dilatation on supine imaging. Distal gas and stool.  Vascular calcifications in the pelvis.  IMPRESSION: No acute findings.  Cardiomegaly with similar prominence of the interstitium.  Cannot exclude mild pulmonary venous congestion.  Chronic lower lobe opacities are likely areas of atelectasis or scarring.   Original Report Authenticated By: Abigail Miyamoto, M.D.      Diagnosis: 1. Abdominal pain 2. Rectal bleeding    MDM  Patient presents to the ER for evaluation of right upper abdominal discomfort (please note that the nursing notes indicate right lower quadrant, but this is incorrect). Patient has been  experiencing intermittent right upper abdominal pain for some time. He has been evaluated by Dr. Paulita Fujita, including ultrasound, upper and lower endoscopy. Ultrasound results were reviewed, unremarkable other than evidence of ascites and underlying liver disease. At this time I do not have a report of the endoscopy suite, but patient reports that nothing was found.  Patient's blood work today is unremarkable. Patient's pain is focal to the right upper quadrant, no guarding, rebound or signs of peritonitis. No concern for spontaneous bacterial peritonitis in this patient. Although he reports 2 episodes of bright red blood per rectum, he is hemodynamically stable. No significant anemia. Patient will be discharged, followup with Dr. Paulita Fujita for further evaluation.        Orpah Greek, MD 03/28/12 [phone]

## 2012-03-28 NOTE — ED Notes (Signed)
Pt presents to department for evaluation of RLQ abdominal pain and rectal bleeding. Ongoing x1 week. 6/10 pain upon arrival. Pt also states generalized weakness and N/V. He is dialysis patient, treatments on Mon, Wed, and Fri. Pt is conscious alert and oriented x4.

## 2012-04-02 DIAGNOSIS — N186 End stage renal disease: Secondary | ICD-10-CM | POA: Diagnosis not present

## 2012-04-05 ENCOUNTER — Encounter: Payer: Self-pay | Admitting: Vascular Surgery

## 2012-04-05 DIAGNOSIS — N186 End stage renal disease: Secondary | ICD-10-CM | POA: Diagnosis not present

## 2012-04-05 DIAGNOSIS — D509 Iron deficiency anemia, unspecified: Secondary | ICD-10-CM | POA: Diagnosis not present

## 2012-04-05 DIAGNOSIS — N2581 Secondary hyperparathyroidism of renal origin: Secondary | ICD-10-CM | POA: Diagnosis not present

## 2012-04-05 DIAGNOSIS — Z992 Dependence on renal dialysis: Secondary | ICD-10-CM | POA: Diagnosis not present

## 2012-04-05 DIAGNOSIS — D631 Anemia in chronic kidney disease: Secondary | ICD-10-CM | POA: Diagnosis not present

## 2012-04-06 ENCOUNTER — Ambulatory Visit: Payer: Medicare Other | Admitting: Vascular Surgery

## 2012-04-13 DIAGNOSIS — K921 Melena: Secondary | ICD-10-CM | POA: Diagnosis not present

## 2012-04-13 DIAGNOSIS — R112 Nausea with vomiting, unspecified: Secondary | ICD-10-CM | POA: Diagnosis not present

## 2012-05-03 DIAGNOSIS — N186 End stage renal disease: Secondary | ICD-10-CM | POA: Diagnosis not present

## 2012-05-05 DIAGNOSIS — D631 Anemia in chronic kidney disease: Secondary | ICD-10-CM | POA: Diagnosis not present

## 2012-05-05 DIAGNOSIS — Z992 Dependence on renal dialysis: Secondary | ICD-10-CM | POA: Diagnosis not present

## 2012-05-05 DIAGNOSIS — D509 Iron deficiency anemia, unspecified: Secondary | ICD-10-CM | POA: Diagnosis not present

## 2012-05-05 DIAGNOSIS — N2581 Secondary hyperparathyroidism of renal origin: Secondary | ICD-10-CM | POA: Diagnosis not present

## 2012-05-05 DIAGNOSIS — N186 End stage renal disease: Secondary | ICD-10-CM | POA: Diagnosis not present

## 2012-05-20 DIAGNOSIS — L708 Other acne: Secondary | ICD-10-CM | POA: Diagnosis not present

## 2012-05-20 DIAGNOSIS — L259 Unspecified contact dermatitis, unspecified cause: Secondary | ICD-10-CM | POA: Diagnosis not present

## 2012-05-26 DIAGNOSIS — E1129 Type 2 diabetes mellitus with other diabetic kidney complication: Secondary | ICD-10-CM | POA: Diagnosis not present

## 2012-06-02 DIAGNOSIS — N186 End stage renal disease: Secondary | ICD-10-CM | POA: Diagnosis not present

## 2012-06-04 DIAGNOSIS — N2581 Secondary hyperparathyroidism of renal origin: Secondary | ICD-10-CM | POA: Diagnosis not present

## 2012-06-04 DIAGNOSIS — D631 Anemia in chronic kidney disease: Secondary | ICD-10-CM | POA: Diagnosis not present

## 2012-06-04 DIAGNOSIS — D509 Iron deficiency anemia, unspecified: Secondary | ICD-10-CM | POA: Diagnosis not present

## 2012-06-04 DIAGNOSIS — Z992 Dependence on renal dialysis: Secondary | ICD-10-CM | POA: Diagnosis not present

## 2012-06-04 DIAGNOSIS — N186 End stage renal disease: Secondary | ICD-10-CM | POA: Diagnosis not present

## 2012-07-03 DIAGNOSIS — N186 End stage renal disease: Secondary | ICD-10-CM | POA: Diagnosis not present

## 2012-07-05 DIAGNOSIS — D631 Anemia in chronic kidney disease: Secondary | ICD-10-CM | POA: Diagnosis not present

## 2012-07-05 DIAGNOSIS — N2581 Secondary hyperparathyroidism of renal origin: Secondary | ICD-10-CM | POA: Diagnosis not present

## 2012-07-05 DIAGNOSIS — N186 End stage renal disease: Secondary | ICD-10-CM | POA: Diagnosis not present

## 2012-07-05 DIAGNOSIS — Z992 Dependence on renal dialysis: Secondary | ICD-10-CM | POA: Diagnosis not present

## 2012-07-05 DIAGNOSIS — D509 Iron deficiency anemia, unspecified: Secondary | ICD-10-CM | POA: Diagnosis not present

## 2012-07-08 DIAGNOSIS — H40019 Open angle with borderline findings, low risk, unspecified eye: Secondary | ICD-10-CM | POA: Diagnosis not present

## 2012-07-08 DIAGNOSIS — H35349 Macular cyst, hole, or pseudohole, unspecified eye: Secondary | ICD-10-CM | POA: Diagnosis not present

## 2012-07-08 DIAGNOSIS — H43399 Other vitreous opacities, unspecified eye: Secondary | ICD-10-CM | POA: Diagnosis not present

## 2012-07-08 DIAGNOSIS — H35039 Hypertensive retinopathy, unspecified eye: Secondary | ICD-10-CM | POA: Diagnosis not present

## 2012-07-13 ENCOUNTER — Encounter (INDEPENDENT_AMBULATORY_CARE_PROVIDER_SITE_OTHER): Payer: Self-pay | Admitting: Ophthalmology

## 2012-07-22 ENCOUNTER — Encounter (INDEPENDENT_AMBULATORY_CARE_PROVIDER_SITE_OTHER): Payer: Medicare Other | Admitting: Ophthalmology

## 2012-07-22 DIAGNOSIS — I10 Essential (primary) hypertension: Secondary | ICD-10-CM

## 2012-07-22 DIAGNOSIS — H35039 Hypertensive retinopathy, unspecified eye: Secondary | ICD-10-CM

## 2012-07-22 DIAGNOSIS — H43819 Vitreous degeneration, unspecified eye: Secondary | ICD-10-CM

## 2012-07-22 DIAGNOSIS — H35349 Macular cyst, hole, or pseudohole, unspecified eye: Secondary | ICD-10-CM | POA: Diagnosis not present

## 2012-07-22 DIAGNOSIS — H2510 Age-related nuclear cataract, unspecified eye: Secondary | ICD-10-CM

## 2012-08-02 DIAGNOSIS — N186 End stage renal disease: Secondary | ICD-10-CM | POA: Diagnosis not present

## 2012-08-04 DIAGNOSIS — N2581 Secondary hyperparathyroidism of renal origin: Secondary | ICD-10-CM | POA: Diagnosis not present

## 2012-08-04 DIAGNOSIS — D509 Iron deficiency anemia, unspecified: Secondary | ICD-10-CM | POA: Diagnosis not present

## 2012-08-04 DIAGNOSIS — Z992 Dependence on renal dialysis: Secondary | ICD-10-CM | POA: Diagnosis not present

## 2012-08-04 DIAGNOSIS — D631 Anemia in chronic kidney disease: Secondary | ICD-10-CM | POA: Diagnosis not present

## 2012-08-04 DIAGNOSIS — N186 End stage renal disease: Secondary | ICD-10-CM | POA: Diagnosis not present

## 2012-08-25 DIAGNOSIS — E1129 Type 2 diabetes mellitus with other diabetic kidney complication: Secondary | ICD-10-CM | POA: Diagnosis not present

## 2012-09-02 DIAGNOSIS — N186 End stage renal disease: Secondary | ICD-10-CM | POA: Diagnosis not present

## 2012-09-03 DIAGNOSIS — D509 Iron deficiency anemia, unspecified: Secondary | ICD-10-CM | POA: Diagnosis not present

## 2012-09-03 DIAGNOSIS — N2581 Secondary hyperparathyroidism of renal origin: Secondary | ICD-10-CM | POA: Diagnosis not present

## 2012-09-03 DIAGNOSIS — D631 Anemia in chronic kidney disease: Secondary | ICD-10-CM | POA: Diagnosis not present

## 2012-09-03 DIAGNOSIS — N186 End stage renal disease: Secondary | ICD-10-CM | POA: Diagnosis not present

## 2012-09-03 DIAGNOSIS — Z992 Dependence on renal dialysis: Secondary | ICD-10-CM | POA: Diagnosis not present

## 2012-10-03 DIAGNOSIS — N186 End stage renal disease: Secondary | ICD-10-CM | POA: Diagnosis not present

## 2012-10-04 DIAGNOSIS — N186 End stage renal disease: Secondary | ICD-10-CM | POA: Diagnosis not present

## 2012-10-04 DIAGNOSIS — D509 Iron deficiency anemia, unspecified: Secondary | ICD-10-CM | POA: Diagnosis not present

## 2012-10-04 DIAGNOSIS — N2581 Secondary hyperparathyroidism of renal origin: Secondary | ICD-10-CM | POA: Diagnosis not present

## 2012-10-04 DIAGNOSIS — D631 Anemia in chronic kidney disease: Secondary | ICD-10-CM | POA: Diagnosis not present

## 2012-10-04 DIAGNOSIS — Z992 Dependence on renal dialysis: Secondary | ICD-10-CM | POA: Diagnosis not present

## 2012-10-20 DIAGNOSIS — Z8619 Personal history of other infectious and parasitic diseases: Secondary | ICD-10-CM | POA: Diagnosis not present

## 2012-10-20 DIAGNOSIS — R112 Nausea with vomiting, unspecified: Secondary | ICD-10-CM | POA: Diagnosis not present

## 2012-10-20 DIAGNOSIS — R141 Gas pain: Secondary | ICD-10-CM | POA: Diagnosis not present

## 2012-11-02 DIAGNOSIS — N186 End stage renal disease: Secondary | ICD-10-CM | POA: Diagnosis not present

## 2012-11-03 DIAGNOSIS — Z992 Dependence on renal dialysis: Secondary | ICD-10-CM | POA: Diagnosis not present

## 2012-11-03 DIAGNOSIS — D631 Anemia in chronic kidney disease: Secondary | ICD-10-CM | POA: Diagnosis not present

## 2012-11-03 DIAGNOSIS — D509 Iron deficiency anemia, unspecified: Secondary | ICD-10-CM | POA: Diagnosis not present

## 2012-11-03 DIAGNOSIS — N2581 Secondary hyperparathyroidism of renal origin: Secondary | ICD-10-CM | POA: Diagnosis not present

## 2012-11-03 DIAGNOSIS — N186 End stage renal disease: Secondary | ICD-10-CM | POA: Diagnosis not present

## 2012-11-24 DIAGNOSIS — E1129 Type 2 diabetes mellitus with other diabetic kidney complication: Secondary | ICD-10-CM | POA: Diagnosis not present

## 2012-11-25 DIAGNOSIS — R112 Nausea with vomiting, unspecified: Secondary | ICD-10-CM | POA: Diagnosis not present

## 2012-11-25 DIAGNOSIS — K3189 Other diseases of stomach and duodenum: Secondary | ICD-10-CM | POA: Diagnosis not present

## 2012-12-03 DIAGNOSIS — N186 End stage renal disease: Secondary | ICD-10-CM | POA: Diagnosis not present

## 2012-12-06 DIAGNOSIS — Z992 Dependence on renal dialysis: Secondary | ICD-10-CM | POA: Diagnosis not present

## 2012-12-06 DIAGNOSIS — A4102 Sepsis due to Methicillin resistant Staphylococcus aureus: Secondary | ICD-10-CM | POA: Diagnosis not present

## 2012-12-06 DIAGNOSIS — D631 Anemia in chronic kidney disease: Secondary | ICD-10-CM | POA: Diagnosis not present

## 2012-12-06 DIAGNOSIS — N186 End stage renal disease: Secondary | ICD-10-CM | POA: Diagnosis not present

## 2012-12-06 DIAGNOSIS — N2581 Secondary hyperparathyroidism of renal origin: Secondary | ICD-10-CM | POA: Diagnosis not present

## 2012-12-06 DIAGNOSIS — D509 Iron deficiency anemia, unspecified: Secondary | ICD-10-CM | POA: Diagnosis not present

## 2012-12-17 ENCOUNTER — Encounter (HOSPITAL_COMMUNITY): Payer: Self-pay | Admitting: Emergency Medicine

## 2012-12-17 ENCOUNTER — Emergency Department (HOSPITAL_COMMUNITY)
Admission: EM | Admit: 2012-12-17 | Discharge: 2012-12-17 | Discharge status: Against Medical Advice | Payer: Medicare Other | Attending: Emergency Medicine | Admitting: Emergency Medicine

## 2012-12-17 DIAGNOSIS — Z8739 Personal history of other diseases of the musculoskeletal system and connective tissue: Secondary | ICD-10-CM | POA: Insufficient documentation

## 2012-12-17 DIAGNOSIS — F17200 Nicotine dependence, unspecified, uncomplicated: Secondary | ICD-10-CM | POA: Insufficient documentation

## 2012-12-17 DIAGNOSIS — J4489 Other specified chronic obstructive pulmonary disease: Secondary | ICD-10-CM | POA: Insufficient documentation

## 2012-12-17 DIAGNOSIS — T819XXA Unspecified complication of procedure, initial encounter: Secondary | ICD-10-CM | POA: Diagnosis not present

## 2012-12-17 DIAGNOSIS — T82838A Hemorrhage of vascular prosthetic devices, implants and grafts, initial encounter: Secondary | ICD-10-CM

## 2012-12-17 DIAGNOSIS — I12 Hypertensive chronic kidney disease with 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: Secondary | ICD-10-CM | POA: Insufficient documentation

## 2012-12-17 DIAGNOSIS — J449 Chronic obstructive pulmonary disease, unspecified: Secondary | ICD-10-CM | POA: Insufficient documentation

## 2012-12-17 DIAGNOSIS — T82898A Other specified complication of vascular prosthetic devices, implants and grafts, initial encounter: Secondary | ICD-10-CM | POA: Diagnosis not present

## 2012-12-17 DIAGNOSIS — Y832 Surgical operation with anastomosis, bypass or graft as the cause of abnormal reaction of the patient, or of later complication, without mention of misadventure at the time of the procedure: Secondary | ICD-10-CM | POA: Insufficient documentation

## 2012-12-17 DIAGNOSIS — N186 End stage renal disease: Secondary | ICD-10-CM | POA: Insufficient documentation

## 2012-12-17 DIAGNOSIS — Z862 Personal history of diseases of the blood and blood-forming organs and certain disorders involving the immune mechanism: Secondary | ICD-10-CM | POA: Insufficient documentation

## 2012-12-17 DIAGNOSIS — Z992 Dependence on renal dialysis: Secondary | ICD-10-CM | POA: Insufficient documentation

## 2012-12-17 DIAGNOSIS — IMO0002 Reserved for concepts with insufficient information to code with codable children: Secondary | ICD-10-CM | POA: Insufficient documentation

## 2012-12-17 DIAGNOSIS — Z79899 Other long term (current) drug therapy: Secondary | ICD-10-CM | POA: Insufficient documentation

## 2012-12-17 DIAGNOSIS — R42 Dizziness and giddiness: Secondary | ICD-10-CM | POA: Insufficient documentation

## 2012-12-17 DIAGNOSIS — Z8639 Personal history of other endocrine, nutritional and metabolic disease: Secondary | ICD-10-CM | POA: Insufficient documentation

## 2012-12-17 NOTE — ED Notes (Signed)
Discharge instructions reviewed with pt. Pt verbalized understanding.   

## 2012-12-17 NOTE — ED Provider Notes (Signed)
CSN: [id number]     Arrival date & time 12/17/12  1947 History   First MD Initiated Contact with Patient 12/17/12 1948     Chief Complaint  Patient presents with  . Bleeding from Shunt.    (Consider location/radiation/quality/duration/timing/severity/associated sxs/prior Treatment) The history is provided by the patient. No language interpreter was used.   Patient's 47 year old African American male past medical history of end-stage renal disease who comes emergency department today with bleeding from his dialysis access site. He has had issues with this in the past. Initial bleeding resolves after yields pressure. Today he removed the bandage from his dialysis site after he got home. Approximately 2-3 minutes later he noticed severe bleeding from that site. He held pressure but was unable to stop the bleeding as a result he contacted EMS.  He reports some dizziness.  EMS applied a pressure bandage. Patient had no other issues in route.  Past Medical History  Diagnosis Date  . Chronic kidney disease   . Hypertension   . COPD (chronic obstructive pulmonary disease)   . Arthritis     GOUT  . Anemia   . Secondary hyperparathyroidism    Past Surgical History  Procedure Laterality Date  . Av fistula placement  2009    Left lower arm AVF   History reviewed. No pertinent family history. History  Substance Use Topics  . Smoking status: Current Every Day Smoker -- 0.50 packs/day    Types: Cigarettes  . Smokeless tobacco: Not on file  . Alcohol Use: Yes     Comment: occassional    Review of Systems  Constitutional: Negative for fever and chills.  Respiratory: Negative for cough and shortness of breath.   Cardiovascular: Negative for chest pain.  Gastrointestinal: Negative for nausea, vomiting, diarrhea and constipation.  Genitourinary: Negative for dysuria, urgency and frequency.  Musculoskeletal: Negative for back pain.  Skin: Negative for color change and wound.  All other  systems reviewed and are negative.    Allergies  Aspirin  Home Medications   Current Outpatient Rx  Name  Route  Sig  Dispense  Refill  . amLODipine (NORVASC) 10 MG tablet   Oral   Take 10 mg by mouth daily.         . cloNIDine (CATAPRES - DOSED IN MG/24 HR) 0.3 mg/24hr   Transdermal   Place 1 patch onto the skin once a week.         . esomeprazole (NEXIUM) 40 MG capsule   Oral   Take 40 mg by mouth daily before breakfast.         . lisinopril (PRINIVIL,ZESTRIL) 20 MG tablet   Oral   Take 40 mg by mouth at bedtime.         . sevelamer carbonate (RENVELA) 800 MG tablet   Oral   Take 4,000 mg by mouth 3 (three) times daily with meals.           BP 157/103  Pulse 130  Temp(Src) 100.8 F (38.2 C) (Oral)  Resp 20  SpO2 99% Physical Exam  Nursing note and vitals reviewed. Constitutional: He is oriented to person, place, and time. He appears well-developed and well-nourished. No distress.  HENT:  Head: Normocephalic and atraumatic.  Eyes: Pupils are equal, round, and reactive to light.  Neck: Normal range of motion.  Cardiovascular: Normal rate, regular rhythm and normal heart sounds.   Pulmonary/Chest: Effort normal and breath sounds normal. No respiratory distress. He has no decreased breath sounds. He  has no wheezes. He has no rhonchi. He has no rales.  Abdominal: Soft. He exhibits no distension. There is no tenderness. There is no rebound and no guarding.  Musculoskeletal: He exhibits no edema and no tenderness.  Neurological: He is alert and oriented to person, place, and time. He exhibits normal muscle tone.  Skin: Skin is warm and dry.  Dialysis graft to the LUE with pressure bandage in place.  Dressing CDI.      ED Course  Procedures (including critical care time)  Pressure Bandage Application Indication:  Bleeding from dialysis graft. Procedure:  The old pressure bandage was removed.  Site was initially hemostatic then developed an arterial bleed  after approximately 1 second.  A new bandage was applied with clot builder bandage applied beneath a pressure bandage.  Neurovascular check was unchanged after application of bandage.    Labs Review Labs Reviewed - No data to display Imaging Review No results found.  EKG Interpretation   None       MDM   Patient is a 47 year old African American male past medical history adrenal disease comes emergency department today with bleeding from his fistula. Physical exam as above. With patient's tachycardia was felt to require a fluid bolus and labs. Patient refuses at this time stating that usually becomes tachycardic when he becomes stressed out. He requested that he be allowed to rest for approximately 10 minutes be reevaluated at that time. After 10 minutes patient was reevaluated he remained tachycardic although his heart rate was only 100 to 110 as opposed to 130s.  Important so fluid boluses stress to the patient. He stated that he does not want this because he is concerned that they would fluid overloaded and that would interfere with his dialysis. The importance of fluids to decrease his heart rate was stressed to him.  Patient continued to refuse.  Patient also refused to have labs drawn. He stated that he would not allowed to give him blood even if were drawn because he did not like the idea of getting blood from people.  He requested that his bandage be changed. His bandage was changed as detailed above. Once again the importance of labs and fluids were stressed to the patient. He continued to refuse. He was observed in the emergency department for approximately 30 minutes. He had no bleeding through his dressing. Patient signed out Cisne after this time. It is felt to be of sound mind. Family was present. And they felt this is consistent with the patient's manner of thinking. Patient expressed that he understood the need for fluids but stated that he would drink more at home and would rather be  hypovolemic as fluid overloaded. Patient is felt to be of sound mind. Is provided with strict return precautions including worsening dizziness, shortness of breath, chest pain, pain to his arm, or continued bleeding. Patient expressed understanding. He left in stable condition. Care was discussed with my attending Dr. Audie Pinto.    1. Bleeding due to dialysis catheter placement, initial encounter       Katheren Shams, MD 12/17/12 [phone]

## 2012-12-17 NOTE — ED Notes (Signed)
Pt states he does not want any blood work or an IV.

## 2012-12-17 NOTE — ED Notes (Addendum)
Pt denies any light headedness or dizziness at this time. Bleeding is currently controlled.

## 2012-12-17 NOTE — ED Notes (Signed)
Pt states he went to dialysis this morning around 6am. Pt states he took his dressing off his shunt began to bleed. Pt states he attempted to put pressure on it but he could not stop it. Pt has dressing to bandage.

## 2012-12-19 ENCOUNTER — Encounter (HOSPITAL_COMMUNITY): Payer: Self-pay | Admitting: Emergency Medicine

## 2012-12-19 ENCOUNTER — Inpatient Hospital Stay (HOSPITAL_COMMUNITY)
Admission: EM | Admit: 2012-12-19 | Discharge: 2012-12-24 | DRG: 264 | Disposition: A | Discharge status: Home / Self Care | Payer: Medicare Other | Attending: Internal Medicine | Admitting: Internal Medicine

## 2012-12-19 ENCOUNTER — Emergency Department (HOSPITAL_COMMUNITY): Payer: Medicare Other

## 2012-12-19 DIAGNOSIS — Z79899 Other long term (current) drug therapy: Secondary | ICD-10-CM | POA: Diagnosis not present

## 2012-12-19 DIAGNOSIS — F17200 Nicotine dependence, unspecified, uncomplicated: Secondary | ICD-10-CM | POA: Diagnosis present

## 2012-12-19 DIAGNOSIS — R509 Fever, unspecified: Secondary | ICD-10-CM | POA: Diagnosis not present

## 2012-12-19 DIAGNOSIS — A4102 Sepsis due to Methicillin resistant Staphylococcus aureus: Secondary | ICD-10-CM | POA: Diagnosis present

## 2012-12-19 DIAGNOSIS — A4901 Methicillin susceptible Staphylococcus aureus infection, unspecified site: Secondary | ICD-10-CM | POA: Diagnosis not present

## 2012-12-19 DIAGNOSIS — T827XXA Infection and inflammatory reaction due to other cardiac and vascular devices, implants and grafts, initial encounter: Secondary | ICD-10-CM | POA: Diagnosis not present

## 2012-12-19 DIAGNOSIS — I12 Hypertensive chronic kidney disease with 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: Secondary | ICD-10-CM | POA: Diagnosis present

## 2012-12-19 DIAGNOSIS — N19 Unspecified kidney failure: Secondary | ICD-10-CM | POA: Diagnosis not present

## 2012-12-19 DIAGNOSIS — Z113 Encounter for screening for infections with a predominantly sexual mode of transmission: Secondary | ICD-10-CM | POA: Diagnosis not present

## 2012-12-19 DIAGNOSIS — K219 Gastro-esophageal reflux disease without esophagitis: Secondary | ICD-10-CM | POA: Diagnosis present

## 2012-12-19 DIAGNOSIS — R000 Tachycardia, unspecified: Secondary | ICD-10-CM | POA: Diagnosis present

## 2012-12-19 DIAGNOSIS — A419 Sepsis, unspecified organism: Secondary | ICD-10-CM | POA: Diagnosis present

## 2012-12-19 DIAGNOSIS — Z992 Dependence on renal dialysis: Secondary | ICD-10-CM | POA: Diagnosis not present

## 2012-12-19 DIAGNOSIS — D631 Anemia in chronic kidney disease: Secondary | ICD-10-CM | POA: Diagnosis not present

## 2012-12-19 DIAGNOSIS — T82898A Other specified complication of vascular prosthetic devices, implants and grafts, initial encounter: Secondary | ICD-10-CM | POA: Diagnosis not present

## 2012-12-19 DIAGNOSIS — IMO0002 Reserved for concepts with insufficient information to code with codable children: Secondary | ICD-10-CM | POA: Diagnosis present

## 2012-12-19 DIAGNOSIS — N186 End stage renal disease: Secondary | ICD-10-CM | POA: Diagnosis present

## 2012-12-19 DIAGNOSIS — R7881 Bacteremia: Secondary | ICD-10-CM

## 2012-12-19 DIAGNOSIS — Z8614 Personal history of Methicillin resistant Staphylococcus aureus infection: Secondary | ICD-10-CM

## 2012-12-19 DIAGNOSIS — N2581 Secondary hyperparathyroidism of renal origin: Secondary | ICD-10-CM | POA: Diagnosis present

## 2012-12-19 DIAGNOSIS — R634 Abnormal weight loss: Secondary | ICD-10-CM

## 2012-12-19 DIAGNOSIS — M436 Torticollis: Secondary | ICD-10-CM | POA: Diagnosis present

## 2012-12-19 DIAGNOSIS — J449 Chronic obstructive pulmonary disease, unspecified: Secondary | ICD-10-CM | POA: Diagnosis present

## 2012-12-19 DIAGNOSIS — I10 Essential (primary) hypertension: Secondary | ICD-10-CM | POA: Diagnosis present

## 2012-12-19 DIAGNOSIS — N189 Chronic kidney disease, unspecified: Secondary | ICD-10-CM | POA: Diagnosis present

## 2012-12-19 DIAGNOSIS — J4489 Other specified chronic obstructive pulmonary disease: Secondary | ICD-10-CM | POA: Diagnosis present

## 2012-12-19 DIAGNOSIS — I517 Cardiomegaly: Secondary | ICD-10-CM | POA: Diagnosis not present

## 2012-12-19 DIAGNOSIS — J811 Chronic pulmonary edema: Secondary | ICD-10-CM | POA: Diagnosis not present

## 2012-12-19 DIAGNOSIS — D630 Anemia in neoplastic disease: Secondary | ICD-10-CM | POA: Diagnosis present

## 2012-12-19 DIAGNOSIS — Y832 Surgical operation with anastomosis, bypass or graft as the cause of abnormal reaction of the patient, or of later complication, without mention of misadventure at the time of the procedure: Secondary | ICD-10-CM | POA: Diagnosis present

## 2012-12-19 DIAGNOSIS — R112 Nausea with vomiting, unspecified: Secondary | ICD-10-CM

## 2012-12-19 DIAGNOSIS — A4902 Methicillin resistant Staphylococcus aureus infection, unspecified site: Secondary | ICD-10-CM | POA: Diagnosis not present

## 2012-12-19 DIAGNOSIS — B9562 Methicillin resistant Staphylococcus aureus infection as the cause of diseases classified elsewhere: Secondary | ICD-10-CM

## 2012-12-19 DIAGNOSIS — J45909 Unspecified asthma, uncomplicated: Secondary | ICD-10-CM

## 2012-12-19 DIAGNOSIS — R0989 Other specified symptoms and signs involving the circulatory and respiratory systems: Secondary | ICD-10-CM | POA: Diagnosis not present

## 2012-12-19 HISTORY — DX: Personal history of Methicillin resistant Staphylococcus aureus infection: Z86.14

## 2012-12-19 LAB — CBC
Hemoglobin: 10.8 g/dL — ABNORMAL LOW (ref 13.0–17.0)
Platelets: 198 10*3/uL (ref 150–400)
RBC: 3.22 MIL/uL — ABNORMAL LOW (ref 4.22–5.81)
WBC: 12.8 10*3/uL — ABNORMAL HIGH (ref 4.0–10.5)

## 2012-12-19 LAB — COMPREHENSIVE METABOLIC PANEL
AST: 12 U/L (ref 0–37)
Alkaline Phosphatase: 51 U/L (ref 39–117)
CO2: 24 mEq/L (ref 19–32)
Calcium: 10 mg/dL (ref 8.4–10.5)
Chloride: 90 mEq/L — ABNORMAL LOW (ref 96–112)
Creatinine, Ser: 13.22 mg/dL — ABNORMAL HIGH (ref 0.50–1.35)
GFR calc Af Amer: 4 mL/min — ABNORMAL LOW (ref >90)
GFR calc non Af Amer: 4 mL/min — ABNORMAL LOW (ref >90)
Glucose, Bld: 124 mg/dL — ABNORMAL HIGH (ref 70–99)
Potassium: 5.1 mEq/L (ref 3.5–5.1)
Sodium: 134 mEq/L — ABNORMAL LOW (ref 135–145)
Total Protein: 8.1 g/dL (ref 6.0–8.3)

## 2012-12-19 LAB — POCT I-STAT TROPONIN I: Troponin i, poc: 0.05 ng/mL (ref 0.00–0.08)

## 2012-12-19 LAB — LACTIC ACID, PLASMA: Lactic Acid, Venous: 2.1 mmol/L (ref 0.5–2.2)

## 2012-12-19 LAB — CG4 I-STAT (LACTIC ACID): Lactic Acid, Venous: 2.2 mmol/L (ref 0.5–2.2)

## 2012-12-19 MED ORDER — VANCOMYCIN HCL IN DEXTROSE 750-5 MG/150ML-% IV SOLN
750.0000 mg | INTRAVENOUS | Status: DC
  Administered 2012-12-20 – 2012-12-24 (×4): 750 mg via INTRAVENOUS
  Filled 2012-12-19 (×4): qty 150

## 2012-12-19 MED ORDER — LISINOPRIL 40 MG PO TABS
40.0000 mg | ORAL_TABLET | Freq: Every day | ORAL | Status: DC
  Administered 2012-12-19 – 2012-12-23 (×6): 40 mg via ORAL
  Filled 2012-12-19 (×4): qty 1
  Filled 2012-12-19: qty 2
  Filled 2012-12-19 (×2): qty 1

## 2012-12-19 MED ORDER — VANCOMYCIN HCL IN DEXTROSE 750-5 MG/150ML-% IV SOLN
750.0000 mg | INTRAVENOUS | Status: AC
  Administered 2012-12-19: 750 mg via INTRAVENOUS
  Filled 2012-12-19: qty 150

## 2012-12-19 MED ORDER — ACETAMINOPHEN 325 MG PO TABS
650.0000 mg | ORAL_TABLET | Freq: Once | ORAL | Status: AC
  Administered 2012-12-19 – 2012-12-20 (×2): 650 mg via ORAL
  Filled 2012-12-19: qty 2

## 2012-12-19 MED ORDER — ACETAMINOPHEN 650 MG RE SUPP: 650.0000 mg | Freq: Four times a day (QID) | RECTAL | Status: DC | PRN

## 2012-12-19 MED ORDER — CLONIDINE HCL 0.3 MG/24HR TD PTWK: 0.3000 mg | MEDICATED_PATCH | TRANSDERMAL | Status: DC

## 2012-12-19 MED ORDER — PIPERACILLIN-TAZOBACTAM 3.375 G IVPB
3.3750 g | Freq: Once | INTRAVENOUS | Status: AC
  Administered 2012-12-19: 3.375 g via INTRAVENOUS
  Filled 2012-12-19: qty 50

## 2012-12-19 MED ORDER — DEXTROSE 5 % IV SOLN
2.0000 g | Freq: Two times a day (BID) | INTRAVENOUS | Status: DC
  Administered 2012-12-19: 2 g via INTRAVENOUS
  Filled 2012-12-19 (×3): qty 2

## 2012-12-19 MED ORDER — MIDAZOLAM HCL 2 MG/2ML IJ SOLN
INTRAMUSCULAR | Status: AC
  Administered 2012-12-19: 2 mg
  Filled 2012-12-19: qty 2

## 2012-12-19 MED ORDER — VANCOMYCIN HCL IN DEXTROSE 1-5 GM/200ML-% IV SOLN
1000.0000 mg | Freq: Once | INTRAVENOUS | Status: AC
  Administered 2012-12-19: 1000 mg via INTRAVENOUS
  Filled 2012-12-19: qty 200

## 2012-12-19 MED ORDER — PANTOPRAZOLE SODIUM 40 MG PO TBEC
80.0000 mg | DELAYED_RELEASE_TABLET | Freq: Every day | ORAL | Status: DC
  Administered 2012-12-20 – 2012-12-24 (×5): 80 mg via ORAL
  Filled 2012-12-19 (×6): qty 2

## 2012-12-19 MED ORDER — SEVELAMER CARBONATE 800 MG PO TABS
4000.0000 mg | ORAL_TABLET | Freq: Three times a day (TID) | ORAL | Status: DC
  Administered 2012-12-20: 2400 mg via ORAL
  Administered 2012-12-20: 3200 mg via ORAL
  Administered 2012-12-21 – 2012-12-24 (×4): 4000 mg via ORAL
  Filled 2012-12-19 (×16): qty 5

## 2012-12-19 MED ORDER — SODIUM CHLORIDE 0.9 % IJ SOLN
3.0000 mL | Freq: Two times a day (BID) | INTRAMUSCULAR | Status: DC
  Administered 2012-12-20 – 2012-12-24 (×7): 3 mL via INTRAVENOUS

## 2012-12-19 MED ORDER — HEPARIN SODIUM (PORCINE) 5000 UNIT/ML IJ SOLN
5000.0000 [IU] | Freq: Three times a day (TID) | INTRAMUSCULAR | Status: DC
  Administered 2012-12-19 – 2012-12-23 (×3): 5000 [IU] via SUBCUTANEOUS
  Filled 2012-12-19 (×17): qty 1

## 2012-12-19 MED ORDER — FENTANYL CITRATE 0.05 MG/ML IJ SOLN
INTRAMUSCULAR | Status: AC
  Administered 2012-12-19: 100 ug
  Filled 2012-12-19: qty 2

## 2012-12-19 MED ORDER — SODIUM CHLORIDE 0.9 % IV BOLUS (SEPSIS)
500.0000 mL | Freq: Once | INTRAVENOUS | Status: AC
  Administered 2012-12-19: 500 mL via INTRAVENOUS

## 2012-12-19 MED ORDER — AMLODIPINE BESYLATE 10 MG PO TABS
10.0000 mg | ORAL_TABLET | Freq: Every day | ORAL | Status: DC
  Administered 2012-12-19 – 2012-12-24 (×6): 10 mg via ORAL
  Filled 2012-12-19 (×7): qty 1

## 2012-12-19 MED ORDER — ACETAMINOPHEN 325 MG PO TABS
650.0000 mg | ORAL_TABLET | Freq: Four times a day (QID) | ORAL | Status: DC | PRN
  Administered 2012-12-20 (×2): 650 mg via ORAL
  Filled 2012-12-19 (×2): qty 2

## 2012-12-19 NOTE — H&P (Signed)
[facility] History and Physical  Brixton Franko SAY:[id number] DOB: Sep 06, 1965 DOA: 12/19/2012  Referring physician: ED PCP: No PCP Per Patient   Chief Complaint: Rigors  HPI: Tremar Wickens is a 47 y.o. male who presents to the ED with c/o fevers, chills, rigors.  He has a h/o ESRD with dialysis MWF.  He also reports c/o stiff neck associated with this illness, he is unable to flex, extend, or rotate his neck at this time which is new.  Review of Systems: 12 systems reviewed and otherwise negative.  Past Medical History  Diagnosis Date  . Chronic kidney disease   . Hypertension   . COPD (chronic obstructive pulmonary disease)   . Arthritis     GOUT  . Anemia   . Secondary hyperparathyroidism    Past Surgical History  Procedure Laterality Date  . Av fistula placement  2009    Left lower arm AVF   Social History:  reports that he has been smoking Cigarettes.  He has been smoking about 0.50 packs per day. He does not have any smokeless tobacco history on file. He reports that he drinks alcohol. He reports that he does not use illicit drugs.   Allergies  Allergen Reactions  . Aspirin     Tends to make him "feel worse" when he tries to take it for a cold or something    History reviewed. No pertinent family history.  Prior to Admission medications   Medication Sig Start Date End Date Taking? Authorizing Provider  amLODipine (NORVASC) 10 MG tablet Take 10 mg by mouth daily.   Yes Historical Provider, MD  cloNIDine (CATAPRES - DOSED IN MG/24 HR) 0.3 mg/24hr Place 1 patch onto the skin once a week.   Yes Historical Provider, MD  esomeprazole (NEXIUM) 40 MG capsule Take 40 mg by mouth daily before breakfast.   Yes Historical Provider, MD  lisinopril (PRINIVIL,ZESTRIL) 20 MG tablet Take 40 mg by mouth at bedtime.   Yes Historical Provider, MD  sevelamer carbonate (RENVELA) 800 MG tablet Take 4,000 mg by mouth 3 (three) times daily with meals.    Yes Historical  Provider, MD   Physical Exam: Filed Vitals:   12/19/12 1951  BP:   Pulse:   Temp: 100 F (37.8 C)  Resp:     General:  NAD, resting comfortably in bed Eyes: PEERLA EOMI ENT: mucous membranes moist Neck: rigid, findings worrisome for meningismus Cardiovascular: RRR w/o MRG Respiratory: CTA B Abdomen: soft, nt, nd, bs+ Skin: no rash nor lesion Musculoskeletal: MAE, full ROM all 4 extremities Psychiatric: normal tone and affect Neurologic: AAOx3, grossly non-focal  Labs on Admission:  Basic Metabolic Panel:  Recent Labs Lab 12/19/12 1700  NA 134*  K 5.1  CL 90*  CO2 24  GLUCOSE 124*  BUN 48*  CREATININE 13.22*  CALCIUM 10.0   Liver Function Tests:  Recent Labs Lab 12/19/12 1700  AST 12  ALT 7  ALKPHOS 51  BILITOT 0.8  PROT 8.1  ALBUMIN 3.3*   No results found for this basename: LIPASE, AMYLASE,  in the last 168 hours No results found for this basename: AMMONIA,  in the last 168 hours CBC:  Recent Labs Lab 12/19/12 1700  WBC 12.8*  HGB 10.8*  HCT 31.7*  MCV 98.4  PLT 198   Cardiac Enzymes: No results found for this basename: CKTOTAL, CKMB, CKMBINDEX, TROPONINI,  in the last 168 hours  BNP (last 3 results) No results found for this basename: PROBNP,  in the last 8760 hours CBG: No results found for this basename: GLUCAP,  in the last 168 hours  Radiological Exams on Admission: Dg Chest 2 View  12/19/2012   CLINICAL DATA:  Weakness, chills  EXAM: CHEST  2 VIEW  COMPARISON:  03/28/2012  FINDINGS: No focal pulmonary opacity allowing for degree of hypoaeration. Heart size is mildly enlarged with central vascular congestion but no overt edema. No pleural effusion. No acute osseous finding.  IMPRESSION: Cardiomegaly with central congestion but no focal acute finding.   Electronically Signed   By: Conchita Paris M.D.   On: 12/19/2012 17:35    EKG: Independently reviewed.  Assessment/Plan Principal Problem:   Sepsis Active Problems:   End stage  renal disease   1. Sepsis - findings of meningismus, fever of 105.0, concerning for bacterial meningitis.  Patient got vanc and zosyn in ED, putting him on vanc and rocephin meningitis doses empirically.  LP being attempted at this time by Dr. Jeanell Sparrow, blood cultures pending. 2. ESRD - Dialysis M/W/F, have called and left message with nephrology for dialysis tomorrow.    Code Status: Full (must indicate code status--if unknown or must be presumed, indicate so) Family Communication: No family in room (indicate person spoken with, if applicable, with phone number if by telephone) Disposition Plan: Admit to sdu (indicate anticipated LOS)  Time spent: 70 min  GARDNER, JARED M. [facility] Pager [phone]  If 7PM-7AM, please contact night-coverage [email or web address] Password [email or web address] 12/19/2012, 9:21 PM

## 2012-12-19 NOTE — ED Provider Notes (Addendum)
CSN: [id number]     Arrival date & time 12/19/12  1600 History   First MD Initiated Contact with Patient 12/19/12 1620     Chief Complaint  Patient presents with  . Weakness  . Tremors   (Consider location/radiation/quality/duration/timing/severity/associated sxs/prior Treatment) HPI Comments: 47 year old dialysis patient presents today with weakness for 36 hours followed by shaking chills today. He has baseline cough that is nonproductive and denies it is worse than usual. He has nausea but has not had vomiting or diarrhea. He does make a small amount of urine every 24 hours. He has not noted any signs of urinary tract infection. He has a shunt in the left upper extremity but has had some bleeding problems over the past 3 days but is currently nontender and not bleeding. He denies any URI symptoms, sore throat, headache, or cellulitis. He is treated by Dr. Moshe Cipro for nephrology. He dialyzes on Mondays Wednesdays and Fridays. He is a smoker and continues to smoke.  Patient is a 47 y.o. male presenting with weakness.  Weakness    Past Medical History  Diagnosis Date  . Chronic kidney disease   . Hypertension   . COPD (chronic obstructive pulmonary disease)   . Arthritis     GOUT  . Anemia   . Secondary hyperparathyroidism    Past Surgical History  Procedure Laterality Date  . Av fistula placement  2009    Left lower arm AVF   History reviewed. No pertinent family history. History  Substance Use Topics  . Smoking status: Current Every Day Smoker -- 0.50 packs/day    Types: Cigarettes  . Smokeless tobacco: Not on file  . Alcohol Use: Yes     Comment: occassional    Review of Systems  Neurological: Positive for weakness.  All other systems reviewed and are negative.    Allergies  Aspirin  Home Medications   Current Outpatient Rx  Name  Route  Sig  Dispense  Refill  . amLODipine (NORVASC) 10 MG tablet   Oral   Take 10 mg by mouth daily.         .  cloNIDine (CATAPRES - DOSED IN MG/24 HR) 0.3 mg/24hr   Transdermal   Place 1 patch onto the skin once a week.         . esomeprazole (NEXIUM) 40 MG capsule   Oral   Take 40 mg by mouth daily before breakfast.         . lisinopril (PRINIVIL,ZESTRIL) 20 MG tablet   Oral   Take 40 mg by mouth at bedtime.         . sevelamer carbonate (RENVELA) 800 MG tablet   Oral   Take 4,000 mg by mouth 3 (three) times daily with meals.           BP 159/96  Pulse 140  Temp(Src) 100.9 F (38.3 C) (Oral)  Resp 18  SpO2 98% Physical Exam  Nursing note and vitals reviewed. Constitutional: He is oriented to person, place, and time. He appears well-developed.  Chronically ill-appearing male  HENT:  Head: Normocephalic.  Right Ear: External ear normal.  Left Ear: External ear normal.  Nose: Nose normal.  Mouth/Throat: Oropharynx is clear and moist.  Eyes: Conjunctivae and EOM are normal. Pupils are equal, round, and reactive to light.  Neck: Neck supple.  Cardiovascular: Tachycardia present.   Pulmonary/Chest: Tachypnea noted.  Abdominal: Soft. Bowel sounds are normal.  Diffusely distended but soft and nontender  Musculoskeletal:  Dialysis fistula left  upper extremity no bleeding is noted it is nontender to palpation  Neurological: He is alert and oriented to person, place, and time.  Skin: Skin is warm and dry.  Psychiatric: He has a normal mood and affect. His behavior is normal. Judgment and thought content normal.    ED Course  Procedures (including critical care time) Labs Review Labs Reviewed  CULTURE, BLOOD (ROUTINE X 2)  CULTURE, BLOOD (ROUTINE X 2)  LACTIC ACID, PLASMA  CBC  COMPREHENSIVE METABOLIC PANEL  URINALYSIS, ROUTINE W REFLEX MICROSCOPIC   Imaging Review No results found.  EKG Interpretation     Ventricular Rate:  135 PR Interval:  136 QRS Duration: 76 QT Interval:  284 QTC Calculation: 426 R Axis:   -26 Text Interpretation:  Sinus tachycardia  Nonspecific T wave abnormality Abnormal ECG           Filed Vitals:   12/19/12 1900  BP: 114/61  Pulse: 92  Temp:   Resp: 20   Results for orders placed during the hospital encounter of 12/19/12  LACTIC ACID, PLASMA      Result Value Range   Lactic Acid, Venous 2.1  0.5 - 2.2 mmol/L  CBC      Result Value Range   WBC 12.8 (*) 4.0 - 10.5 K/uL   RBC 3.22 (*) 4.22 - 5.81 MIL/uL   Hemoglobin 10.8 (*) 13.0 - 17.0 g/dL   HCT 31.7 (*) 39.0 - 52.0 %   MCV 98.4  78.0 - 100.0 fL   MCH 33.5  26.0 - 34.0 pg   MCHC 34.1  30.0 - 36.0 g/dL   RDW 13.1  11.5 - 15.5 %   Platelets 198  150 - 400 K/uL  COMPREHENSIVE METABOLIC PANEL      Result Value Range   Sodium 134 (*) 135 - 145 mEq/L   Potassium 5.1  3.5 - 5.1 mEq/L   Chloride 90 (*) 96 - 112 mEq/L   CO2 24  19 - 32 mEq/L   Glucose, Bld 124 (*) 70 - 99 mg/dL   BUN 48 (*) 6 - 23 mg/dL   Creatinine, Ser 13.22 (*) 0.50 - 1.35 mg/dL   Calcium 10.0  8.4 - 10.5 mg/dL   Total Protein 8.1  6.0 - 8.3 g/dL   Albumin 3.3 (*) 3.5 - 5.2 g/dL   AST 12  0 - 37 U/L   ALT 7  0 - 53 U/L   Alkaline Phosphatase 51  39 - 117 U/L   Total Bilirubin 0.8  0.3 - 1.2 mg/dL   GFR calc non Af Amer 4 (*) >90 mL/min   GFR calc Af Amer 4 (*) >90 mL/min  CG4 I-STAT (LACTIC ACID)      Result Value Range   Lactic Acid, Venous 2.20  0.5 - 2.2 mmol/L  POCT I-STAT TROPONIN I      Result Value Range   Troponin i, poc 0.05  0.00 - 0.08 ng/mL   Comment 3             MDM  No diagnosis found. 47 year old male on dialysis who presents today with a temp to 105 and tachycardia. His received IV fluids, IV antibiotics, and has had blood cultures, chest x-Hyman Crossan, and urine ordered. They have been unable to collect urine and the patient only makes a small amount daily. Patient was covered with Zosyn and vancomycin for empiric coverage of pathogens likely to cause sepsis in this immunocompromised patient. Patient's heart rate has decreased from 111-100.  His blood pressure  remained adequate with a map of 79 currently.  Discussed with Dr. Alcario Drought and plan step down bed.   Shaune Pollack, MD 12/19/12 2005  Dr. Alcario Drought evaluated the patient and patient now states he has neck pain.  Dr. Alcario Drought requests lp and I attempted.  Patient positioned in left lateral decubitus and prepped and draped in usual sterile fashion.  Lidocaine with epi used for local anesthesia.  Attempted x 7 without success.  Patient remained hemodynamically stable and tolerated procedure but complained of severe pain. Dr. Alcario Drought aware.   Shaune Pollack, MD 12/19/12 [phone]

## 2012-12-19 NOTE — ED Notes (Signed)
MD aware of the lactic acid results

## 2012-12-19 NOTE — Progress Notes (Signed)
Despite multiple attempts by EDP, unable to obtain LP at this time.  Consider LP by IR, but patient was very poorly tolerant of LP attempts screaming out in pain.  It may be that he just ends up having to be on broad spectrum ABX for several weeks.

## 2012-12-19 NOTE — ED Notes (Signed)
Pt reports that he was here on Friday because his dialysis site was bleeding. Reports they wanted to give him blood and fluids but he wanted to wait. States that he has continued to feel bad. Reports tremors today and chills. Denies any chest pain or SOB. Abd is distended.

## 2012-12-19 NOTE — ED Provider Notes (Signed)
I saw and evaluated the patient, reviewed the resident's note and I agree with the findings and plan.   .Face to face Exam:  General:  Awake HEENT:  Atraumatic Resp:  Normal effort Abd:  Nondistended Neuro:No focal weakness Patient with capacity to make decisions.   Dot Lanes, MD 12/19/12 [phone]

## 2012-12-19 NOTE — ED Notes (Signed)
Pt refused to allow his CBG to be checked

## 2012-12-19 NOTE — ED Notes (Signed)
Pt refused to allow blood sugar to be checked says he is not a diabetic.

## 2012-12-19 NOTE — Progress Notes (Signed)
ANTIBIOTIC CONSULT NOTE - INITIAL  Pharmacy Consult for vancomycin Indication: rule out sepsis  Allergies  Allergen Reactions  . Aspirin     Tends to make him "feel worse" when he tries to take it for a cold or something    Patient Measurements: weight= 78 kg, height= 66 inches (per patient)    Vital Signs: Temp: 100 F (37.8 C) (11/16 1951) Temp src: Oral (11/16 1951) BP: 118/72 mmHg (11/16 1945) Pulse Rate: 90 (11/16 1945) Intake/Output from previous day:   Intake/Output from this shift:    Labs:  Recent Labs  12/19/12 1700  WBC 12.8*  HGB 10.8*  PLT 198  CREATININE 13.22*   The CrCl is unknown because both a height and weight (above a minimum accepted value) are required for this calculation. No results found for this basename: VANCOTROUGH, VANCOPEAK, VANCORANDOM, GENTTROUGH, GENTPEAK, GENTRANDOM, TOBRATROUGH, TOBRAPEAK, TOBRARND, AMIKACINPEAK, AMIKACINTROU, AMIKACIN,  in the last 72 hours   Microbiology: No results found for this or any previous visit (from the past 720 hour(s)).  Medical History: Past Medical History  Diagnosis Date  . Chronic kidney disease   . Hypertension   . COPD (chronic obstructive pulmonary disease)   . Arthritis     GOUT  . Anemia   . Secondary hyperparathyroidism     Medications:  See med rec  Assessment: Patient is a 47 y.o M with ESRD  Who was here yesterday for bleeding from his fistula site.  He returns to the ED today with c/o chills, fever, and tachycardia.  Patient received vancomycin 1gm x1 given in the ED at ~6PM and zosyn 3.375gm x1 at 5 PM.  Goal of Therapy:  Pre-HD vancomycin level= 15-25  Plan:  1) give an additional vancomycin 722m dose to get a total of 17569mfor today (load) 2) vancomycin 75042mV qHD  Orlandus Borowski P 12/19/2012,8:55 PM

## 2012-12-19 NOTE — ED Notes (Signed)
Verbal order received from [place] MD for 55m Versed and 100 mcg Fentanyl

## 2012-12-20 ENCOUNTER — Encounter (HOSPITAL_COMMUNITY): Payer: Self-pay | Admitting: *Deleted

## 2012-12-20 LAB — CBC
HCT: 26.9 % — ABNORMAL LOW (ref 39.0–52.0)
Hemoglobin: 9.2 g/dL — ABNORMAL LOW (ref 13.0–17.0)
MCH: 33.2 pg (ref 26.0–34.0)
MCHC: 34.2 g/dL (ref 30.0–36.0)
RDW: 13.1 % (ref 11.5–15.5)

## 2012-12-20 LAB — BASIC METABOLIC PANEL
BUN: 56 mg/dL — ABNORMAL HIGH (ref 6–23)
CO2: 26 mEq/L (ref 19–32)
Calcium: 9.5 mg/dL (ref 8.4–10.5)
GFR calc Af Amer: 4 mL/min — ABNORMAL LOW (ref >90)
GFR calc non Af Amer: 3 mL/min — ABNORMAL LOW (ref >90)
Glucose, Bld: 107 mg/dL — ABNORMAL HIGH (ref 70–99)
Potassium: 4.7 mEq/L (ref 3.5–5.1)
Sodium: 134 mEq/L — ABNORMAL LOW (ref 135–145)

## 2012-12-20 MED ORDER — CEFTRIAXONE SODIUM 1 G IJ SOLR
1.0000 g | INTRAMUSCULAR | Status: DC
  Administered 2012-12-20: 1 g via INTRAVENOUS
  Filled 2012-12-20: qty 10

## 2012-12-20 MED ORDER — DOXERCALCIFEROL 4 MCG/2ML IV SOLN
INTRAVENOUS | Status: AC
  Administered 2012-12-20: 6 ug via INTRAVENOUS
  Filled 2012-12-20: qty 4

## 2012-12-20 MED ORDER — PENTAFLUOROPROP-TETRAFLUOROETH EX AERO
INHALATION_SPRAY | CUTANEOUS | Status: AC
  Administered 2012-12-20: 16:00:00
  Filled 2012-12-20: qty 103.5

## 2012-12-20 MED ORDER — CYCLOBENZAPRINE HCL 5 MG PO TABS
7.5000 mg | ORAL_TABLET | Freq: Three times a day (TID) | ORAL | Status: DC | PRN
  Administered 2012-12-20 – 2012-12-22 (×5): 7.5 mg via ORAL
  Filled 2012-12-20 (×6): qty 1.5

## 2012-12-20 MED ORDER — SODIUM CHLORIDE 0.9 % IV SOLN
62.5000 mg | INTRAVENOUS | Status: DC
  Administered 2012-12-20: 62.5 mg via INTRAVENOUS
  Filled 2012-12-20 (×2): qty 5

## 2012-12-20 MED ORDER — DARBEPOETIN ALFA-POLYSORBATE 25 MCG/0.42ML IJ SOLN: 25.0000 ug | INTRAMUSCULAR | Status: DC

## 2012-12-20 MED ORDER — OXYCODONE HCL 5 MG PO TABS
5.0000 mg | ORAL_TABLET | Freq: Once | ORAL | Status: AC
  Administered 2012-12-20: 5 mg via ORAL
  Filled 2012-12-20: qty 1

## 2012-12-20 MED ORDER — DOXERCALCIFEROL 4 MCG/2ML IV SOLN
6.0000 ug | INTRAVENOUS | Status: DC
  Administered 2012-12-20 – 2012-12-24 (×3): 6 ug via INTRAVENOUS
  Filled 2012-12-20 (×2): qty 4

## 2012-12-20 MED ORDER — ACETAMINOPHEN 325 MG PO TABS
ORAL_TABLET | ORAL | Status: AC
  Administered 2012-12-20: 650 mg via ORAL
  Filled 2012-12-20: qty 1

## 2012-12-20 NOTE — Progress Notes (Signed)
Nursing 2300 Received pt from ED to 3S11.  Pt placed in bed, oriented to unit and plan of care.  CHG done. Flow manager notified of pt arrival to floor.  [place] notified.

## 2012-12-20 NOTE — Progress Notes (Signed)
To dialysis via bed.

## 2012-12-20 NOTE — Consult Note (Signed)
Renal Service Consult Note [facility] 12/20/2012 [place] Requesting Physician:  Dr Thereasa Solo  Reason for Consult:  ESRD patient with  HPI: The patient is a 47 y.o. year-old with esrd on HD x years, esrd due to HTN.  Pt went to ED on 11/14 for bleed from avf after removing bandage, bleeding controlled and d/c'd home. Returned to ED on 11/16 with new onset chills and feeling bad. In ED temp was 105, pt was pancultured and admitted for IV abx. Feels a lot better today.   He smokes and has problems with excess "gassiness" which has been worked up by GI with colon and EGD studies. He has had a lot of problems with cramping at HD since he started HD. Not as bad as it used to be.  Signs off at 3-3.5 hrs typically due to cramping.    ROS  no cp or sob  no n/v/d  no jt pain  +hx gout  no skin rash  no confusion or hallucination  Past Medical History  Past Medical History  Diagnosis Date  . Chronic kidney disease   . Hypertension   . COPD (chronic obstructive pulmonary disease)   . Arthritis     GOUT  . Anemia   . Secondary hyperparathyroidism    Past Surgical History  Past Surgical History  Procedure Laterality Date  . Av fistula placement  2009    Left lower arm AVF   Family History History reviewed. No pertinent family history. Social History  reports that he has been smoking Cigarettes.  He has been smoking about 0.50 packs per day. He does not have any smokeless tobacco history on file. He reports that he drinks alcohol. He reports that he does not use illicit drugs. Allergies  Allergies  Allergen Reactions  . Aspirin     Tends to make him "feel worse" when he tries to take it for a cold or something   Home medications Prior to Admission medications   Medication Sig Start Date End Date Taking? Authorizing Provider  amLODipine (NORVASC) 10 MG tablet Take 10 mg by mouth daily.   Yes Historical Provider, MD  cloNIDine (CATAPRES -  DOSED IN MG/24 HR) 0.3 mg/24hr Place 1 patch onto the skin once a week.   Yes Historical Provider, MD  esomeprazole (NEXIUM) 40 MG capsule Take 40 mg by mouth daily before breakfast.   Yes Historical Provider, MD  lisinopril (PRINIVIL,ZESTRIL) 20 MG tablet Take 40 mg by mouth at bedtime.   Yes Historical Provider, MD  sevelamer carbonate (RENVELA) 800 MG tablet Take 4,000 mg by mouth 3 (three) times daily with meals.    Yes Historical Provider, MD   Liver Function Tests  Recent Labs Lab 12/19/12 1700  AST 12  ALT 7  ALKPHOS 51  BILITOT 0.8  PROT 8.1  ALBUMIN 3.3*   No results found for this basename: LIPASE, AMYLASE,  in the last 168 hours CBC  Recent Labs Lab 12/19/12 1700 12/20/12 0430  WBC 12.8* 9.0  HGB 10.8* 9.2*  HCT 31.7* 26.9*  MCV 98.4 97.1  PLT 198 182   Basic Metabolic Panel  Recent Labs Lab 12/19/12 1700 12/20/12 0430  NA 134* 134*  K 5.1 4.7  CL 90* 92*  CO2 24 26  GLUCOSE 124* 107*  BUN 48* 56*  CREATININE 13.22* 14.73*  CALCIUM 10.0 9.5    Exam  Blood pressure 112/81, pulse 68, temperature 98.2 F (36.8 C), temperature source Oral,  resp. rate 20, height 5' 6"  (1.676 m), weight 79.1 kg (174 lb 6.1 oz), SpO2 97.00%.  gen: small framed pleasant AAM in no distress  skin: no rash, cyanosis  heent: eomi, sclera anicteric, throat clear  neck: no jvd, no LAN  chest: crackles R base, left clear  cor: regular, 2/6 SEM rusb, no rub or gallop, pedal pulses intact  abd: soft, distended, overactive bs, nontender, no hsm  ext: no le or ue edema , no joint effusion, no gangrene/ulcers  neuro: alert, ox3, nonfocal  access: lua avf no signs of infection, good thrill  Dialysis orders: MWF at [facility] 4hrs  78kg   2K/2Ca   450/A1.5   Heparin 1800   LUA AVF  Hectorol 6ug     Epo 1800     Venofer 50/wk  Assessment: 1. Fever- in HD pt 48 hrs after access bleed treated in ED. Suspect bacteremia. No gross access infection, awaiting results of cultures, on iv  rocephin, vanc 2. ESRD- plan HD today 3. Anemia of ckd- last tsat 30%, Hb 9.2, on epo at ctr, ordered darbe 25 on wed's 4. 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 HTN/volume- 1 kg up , bp ok, maintain current dry wt, cont home bp meds 6. Tobacco use   Plan-  HD, darbe, vit D, bp meds   Kelly Splinter MD  pager [phone]    cell [phone]  12/20/2012, 11:19 AM

## 2012-12-20 NOTE — Progress Notes (Signed)
Utilization review completed.  

## 2012-12-20 NOTE — Progress Notes (Signed)
[facility] Progress Note [place] TEAM 1 - Stepdown/ICU TEAM   Marce Charlesworth [facility] FHL:[id number] DOB: 01-14-1966 DOA: 12/19/2012 PCP: No PCP Per Patient  Admit HPI / Brief Narrative: 47 y.o. male who presented to the ED with c/o fevers, chills, rigors. He has a h/o ESRD with dialysis M/W/F. He c/o stiff neck associated with this illness, and was unable to flex, extend, or rotate his neck. Of note, the pt was treated for bleeding at his HD access site in the ED on 11/14, with ultimate d/c home.  Assessment/Plan:  SIRS (WBC 12.8, HR 92) SIRS physiology has resolved - clinically pt appears quite stable   FUO - suspected HD access related bacteremia  In HD pt 48 hrs after access bleed treated in ED - on empiric abx coverage - suspect bacteremia - pt was not able to tolerate LP - at this point suspicion of meningitis is very low (no photophobia, no HA, neck pain is described as muscular "like a crick" and not c/w meningismus)  ESRD on HD  Nephrology following   HTN BP will controlled at present   COPD Well compensated at this time  Anemia of CKD Follow Hgb trend - no evidence of blood loss  Code Status: FULL Family Communication: no family present at time of exam Disposition Plan: transfer to nephrology floor   Consultants: Nephrology  Procedures: none  Antibiotics: Rocephin 11/16 >> Zosyn 11/16 Vanc 11/16 >>  DVT prophylaxis: SQ heparin   HPI/Subjective: The patient is alert and conversant.  He specifically denies headache or photophobia.  He describes the pain in the base of his neck as a "crick" but denies specific symptoms of stiffness of the neck or pulling when flexing the neck.  He denies chest pain nausea vomiting or abdominal pain.  He states that he feels much better than he did at the time of his admission.  Objective: Blood pressure 112/81, pulse 68, temperature 99.7 F (37.6 C), temperature source Oral, resp. rate 20, height 5' 6"  (1.676 m),  weight 79.1 kg (174 lb 6.1 oz), SpO2 97.00%.  Intake/Output Summary (Last 24 hours) at 12/20/12 1350 Last data filed at 12/20/12 0900  Gross per 24 hour  Intake    360 ml  Output    100 ml  Net    260 ml    Exam: General: No acute respiratory distress Lungs: Clear to auscultation bilaterally without wheezes or crackles Cardiovascular: Regular rate and rhythm without gallop or rub - 2/6 systolic M - normal S1 and S2 Abdomen: Nontender, nondistended, soft, bowel sounds positive, no rebound, no ascites, no appreciable mass Extremities: No significant cyanosis, clubbing, or edema bilateral lower extremities  Data Reviewed: Basic Metabolic Panel:  Recent Labs Lab 12/19/12 1700 12/20/12 0430  NA 134* 134*  K 5.1 4.7  CL 90* 92*  CO2 24 26  GLUCOSE 124* 107*  BUN 48* 56*  CREATININE 13.22* 14.73*  CALCIUM 10.0 9.5   Liver Function Tests:  Recent Labs Lab 12/19/12 1700  AST 12  ALT 7  ALKPHOS 51  BILITOT 0.8  PROT 8.1  ALBUMIN 3.3*   CBC:  Recent Labs Lab 12/19/12 1700 12/20/12 0430  WBC 12.8* 9.0  HGB 10.8* 9.2*  HCT 31.7* 26.9*  MCV 98.4 97.1  PLT 198 179    Recent Results (from the past 240 hour(s))  MRSA PCR SCREENING     Status: None   Collection Time    12/20/12  5:46 AM  Result Value Range Status   MRSA by PCR NEGATIVE  NEGATIVE Final   Comment:            The GeneXpert MRSA Assay (FDA     approved for NASAL specimens     only), is one component of a     comprehensive MRSA colonization     surveillance program. It is not     intended to diagnose MRSA     infection nor to guide or     monitor treatment for     MRSA infections.     Studies:  Recent x-ray studies have been reviewed in detail by the Attending Physician  Scheduled Meds:  Scheduled Meds: . amLODipine  10 mg Oral Daily  . cefTRIAXone (ROCEPHIN)  IV  2 g Intravenous Q12H  . [START ON 12/26/2012] cloNIDine  0.3 mg Transdermal Weekly  . [START ON 12/22/2012] darbepoetin   25 mcg Intravenous Q Wed-HD  . doxercalciferol  6 mcg Intravenous Q M,W,F-HD  . ferric gluconate (FERRLECIT/NULECIT) IV  62.5 mg Intravenous Q Wed-HD  . heparin  5,000 Units Subcutaneous Q8H  . lisinopril  40 mg Oral QHS  . pantoprazole  80 mg Oral Q1200  . sevelamer carbonate  4,000 mg Oral TID WC  . sodium chloride  3 mL Intravenous Q12H  . vancomycin  750 mg Intravenous Q M,W,F-HD    Time spent on care of this patient: 35 mins   [place]  [phone] Pager - Text Page per Shea Evans as per below:  On-Call/Text Page:      [email or web address]      password [email or web address]  If 7PM-7AM, please contact night-coverage [email or web address] Password [email or web address] 12/20/2012, 1:50 PM   LOS: 1 day

## 2012-12-20 NOTE — Procedures (Signed)
I was present at this dialysis session, have reviewed the session itself and made  appropriate changes   Kelly Splinter MD  pager [phone]    cell [phone]  12/20/2012, 5:38 PM

## 2012-12-20 NOTE — Progress Notes (Signed)
Patient is getting very upset. He is having to wait on diaylsis since they called over 2hrs. Ago. Crying and worrying about his mother. Have reassured the patient that they are coming as soon as possible. Called diaylsis, they are aware of the sistuation.

## 2012-12-20 NOTE — Progress Notes (Signed)
Patient will be transferred to [facility] bed 28 after he gets his dialysis. Phone report was called to [facility]. Patient is aware of the transfer.

## 2012-12-21 DIAGNOSIS — D631 Anemia in chronic kidney disease: Secondary | ICD-10-CM

## 2012-12-21 DIAGNOSIS — D630 Anemia in neoplastic disease: Secondary | ICD-10-CM | POA: Diagnosis present

## 2012-12-21 DIAGNOSIS — K219 Gastro-esophageal reflux disease without esophagitis: Secondary | ICD-10-CM

## 2012-12-21 DIAGNOSIS — R509 Fever, unspecified: Secondary | ICD-10-CM

## 2012-12-21 DIAGNOSIS — N189 Chronic kidney disease, unspecified: Secondary | ICD-10-CM | POA: Diagnosis present

## 2012-12-21 DIAGNOSIS — N186 End stage renal disease: Secondary | ICD-10-CM

## 2012-12-21 DIAGNOSIS — A4902 Methicillin resistant Staphylococcus aureus infection, unspecified site: Secondary | ICD-10-CM

## 2012-12-21 DIAGNOSIS — R7881 Bacteremia: Secondary | ICD-10-CM

## 2012-12-21 LAB — TROPONIN I: Troponin I: <0.3 ng/mL (ref <0.30)

## 2012-12-21 LAB — CBC
HCT: 25.3 % — ABNORMAL LOW (ref 39.0–52.0)
Hemoglobin: 8.5 g/dL — ABNORMAL LOW (ref 13.0–17.0)
MCH: 32.9 pg (ref 26.0–34.0)
MCHC: 33.6 g/dL (ref 30.0–36.0)
MCV: 98.1 fL (ref 78.0–100.0)
Platelets: 198 10*3/uL (ref 150–400)
RBC: 2.58 MIL/uL — ABNORMAL LOW (ref 4.22–5.81)
RDW: 13.1 % (ref 11.5–15.5)
WBC: 8.1 10*3/uL (ref 4.0–10.5)

## 2012-12-21 MED ORDER — SODIUM CHLORIDE 0.9 % IV SOLN: 100.0000 mL | INTRAVENOUS | Status: DC | PRN

## 2012-12-21 MED ORDER — HEPARIN SODIUM (PORCINE) 1000 UNIT/ML DIALYSIS: 1800.0000 [IU] | Freq: Once | INTRAMUSCULAR | Status: DC

## 2012-12-21 MED ORDER — LIDOCAINE HCL (PF) 1 % IJ SOLN: 5.0000 mL | INTRAMUSCULAR | Status: DC | PRN

## 2012-12-21 MED ORDER — PENTAFLUOROPROP-TETRAFLUOROETH EX AERO: 1.0000 "application " | INHALATION_SPRAY | CUTANEOUS | Status: DC | PRN

## 2012-12-21 MED ORDER — LIDOCAINE-PRILOCAINE 2.5-2.5 % EX CREA: 1.0000 "application " | TOPICAL_CREAM | CUTANEOUS | Status: DC | PRN

## 2012-12-21 MED ORDER — DARBEPOETIN ALFA-POLYSORBATE 25 MCG/0.42ML IJ SOLN
60.0000 ug | INTRAMUSCULAR | Status: DC
  Filled 2012-12-21: qty 1.26

## 2012-12-21 MED ORDER — ALTEPLASE 2 MG IJ SOLR: 2.0000 mg | Freq: Once | INTRAMUSCULAR | Status: AC | PRN

## 2012-12-21 MED ORDER — MORPHINE SULFATE 2 MG/ML IJ SOLN
2.0000 mg | INTRAMUSCULAR | Status: DC | PRN
  Administered 2012-12-21: 2 mg via INTRAVENOUS
  Filled 2012-12-21 (×2): qty 1

## 2012-12-21 MED ORDER — NEPRO/CARBSTEADY PO LIQD: 237.0000 mL | ORAL | Status: DC | PRN

## 2012-12-21 MED ORDER — HEPARIN SODIUM (PORCINE) 1000 UNIT/ML DIALYSIS: 1000.0000 [IU] | INTRAMUSCULAR | Status: DC | PRN

## 2012-12-21 NOTE — Progress Notes (Signed)
[facility] Progress Note  Subjective:   Throbbing neck pain. Tmax 99.5.  Denies further chills. BC's positive for gram + cocci in clusters.  Objective Filed Vitals:   12/20/12 1800 12/20/12 1835 12/20/12 1916 12/21/12 0517  BP: 126/77 151/92 139/84 147/88  Pulse: 90 93 87 80  Temp:  99 F (37.2 C) 99 F (37.2 C) 99.5 F (37.5 C)  TempSrc:  Oral Oral Oral  Resp:  20 20 20   Height:      Weight:  76.7 kg (169 lb 1.5 oz) 74.985 kg (165 lb 5 oz)   SpO2:  100% 99% 100%   Physical Exam General: Alert, cooperative, NAD Heart: RRR Lungs: Decreased at bases, no wheezes or rhonchi noted Abdomen: Soft, NT, non-distended, + BS Extremities: No LE edema Dialysis Access: Large aneurysmal L AVF,  + bruit, two areas of whitish blisterlike discoloration on AVF , one of which was macerated and expressed small amt brownish purulent material, sent for cx  Dialysis orders: MWF at [facility]  4hrs 78kg 2K/2Ca 450/A1.5 Heparin 1800 LUA AVF  Hectorol 6ug Epo 1800 Venofer 50/wk  Assessment/Plan: 1. Fever/ T 105 / Gram + bacteremia / - BC's with gram+ cocci in clusters. Tmax 99.5. Vanc per pharmacy there are a couple of areas on AVF suspicious for infection, have sent swab for cx and called vasc surgeon to assess; 1/2 blood cx's are + so far 2. ESRD - MWF, HD tomorrow 3. Anemia - Hgb  8.5 < 9.2 < 10.8, likely secondary to bleed from access on 11/14. Pt wishes to avoid transfusion. Will increase Aranesp to 60 q Wed HD. Continue weekly Fe. Follow CBC 4. Secondary hyperparathyroidism - Ca 9.5 (10 corrected). PTH in range. Continue Renvela, Vit D, low Ca bath. 5. HTN/volume - SBPs 140s on amlodipine, clonidine transdermal and lisinopril. Under EDW, poor po intake last few days. Net UF < 500 cc yesteday.   6. Nutrition - Alb 3.3. Renal diet, multivitamin 7. Tobacco use  Collene Leyden. Cletus Gash, PA-C [facility] Pager [phone] 12/21/2012,8:58 AM  LOS: 2 days   I have seen and examined  patient, discussed with PA and agree with assessment and plan as outlined above with additions as indicated. Kelly Splinter MD pager [phone]    cell [phone] 12/21/2012, 1:04 PM       Additional Objective Labs: Basic Metabolic Panel:  Recent Labs Lab 12/19/12 1700 12/20/12 0430  NA 134* 134*  K 5.1 4.7  CL 90* 92*  CO2 24 26  GLUCOSE 124* 107*  BUN 48* 56*  CREATININE 13.22* 14.73*  CALCIUM 10.0 9.5   Liver Function Tests:  Recent Labs Lab 12/19/12 1700  AST 12  ALT 7  ALKPHOS 51  BILITOT 0.8  PROT 8.1  ALBUMIN 3.3*   CBC:  Recent Labs Lab 12/19/12 1700 12/20/12 0430 12/21/12 0714  WBC 12.8* 9.0 8.1  HGB 10.8* 9.2* 8.5*  HCT 31.7* 26.9* 25.3*  MCV 98.4 97.1 98.1  PLT 198 179 198   Blood Culture    Component Value Date/Time   SDES BLOOD RIGHT FOREARM 12/19/2012 1700   SPECREQUEST BOTTLES DRAWN AEROBIC AND ANAEROBIC 10CC BLUE 5CC RED 12/19/2012 1700   CULT  Value: GRAM POSITIVE COCCI IN CLUSTERS Note: Gram Stain Report Called to,Read Back By and Verified With: Rocco Serene ON 12/20/2012 AT 8:50P BY WILEJ Performed at [facility] 12/19/2012 1700   REPTSTATUS PENDING 12/19/2012 1700    Studies/Results: Dg Chest 2 View  12/19/2012  CLINICAL DATA:  Weakness, chills  EXAM: CHEST  2 VIEW  COMPARISON:  03/28/2012  FINDINGS: No focal pulmonary opacity allowing for degree of hypoaeration. Heart size is mildly enlarged with central vascular congestion but no overt edema. No pleural effusion. No acute osseous finding.  IMPRESSION: Cardiomegaly with central congestion but no focal acute finding.   Electronically Signed   By: Conchita Paris M.D.   On: 12/19/2012 17:35   Medications:   . amLODipine  10 mg Oral Daily  . [START ON 12/26/2012] cloNIDine  0.3 mg Transdermal Weekly  . [START ON 12/22/2012] darbepoetin  60 mcg Intravenous Q Wed-HD  . doxercalciferol  6 mcg Intravenous Q M,W,F-HD  . ferric gluconate (FERRLECIT/NULECIT) IV  62.5 mg  Intravenous Q Wed-HD  . heparin  1,800 Units Dialysis Once in dialysis  . heparin  5,000 Units Subcutaneous Q8H  . lisinopril  40 mg Oral QHS  . pantoprazole  80 mg Oral Q1200  . sevelamer carbonate  4,000 mg Oral TID WC  . sodium chloride  3 mL Intravenous Q12H  . vancomycin  750 mg Intravenous Q M,W,F-HD

## 2012-12-21 NOTE — Progress Notes (Addendum)
Patient stated he was having left sided chest pain, sharp on inhalation. States he is not short of breath or dizzy. Rated pain 8 out of 10. Vitals were taken: 159/89, 106, 99.1. MD was notified. MD called and orders were placed.

## 2012-12-21 NOTE — Consult Note (Addendum)
[facility] NOTE  MRN : [id number]  Reason for Consult: AV fistula aneurysmal formation  Referring Physician: Dr. Melvia [name]  History of Present Illness:a 47 y.o. male who presents to the ED with c/o fevers, chills, rigors. He has a h/o ESRD with dialysis MWF.   He came to ED on 11/14 for bleed from avf after removing bandage, bleeding controlled and he was  d/c'd home. Returned to ED on 11/16 with new onset chills and feeling bad. In ED temp was 105.  This AV fistula was placed by Dr. [name] in March of 2009. He has had an outstanding use of this as this time. 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  He was last seen in our office 06/23/2012.  He was having no difficulty using the fistula and was put on a follow up as needed basis.  We have been consulted to re-access his fistula.  Current Facility-Administered Medications  Medication Dose Route Frequency Provider Last Rate Last Dose  . 0.9 %  sodium chloride infusion  100 mL Intravenous PRN [name], MD      . 0.9 %  sodium chloride infusion  100 mL Intravenous PRN [name], MD      . acetaminophen (TYLENOL) tablet 650 mg  650 mg Oral Q6H PRN [name], DO   650 mg at 12/20/12 1454   Or  . acetaminophen (TYLENOL) suppository 650 mg  650 mg Rectal Q6H PRN [name], DO      . alteplase (CATHFLO ACTIVASE) injection 2 mg  2 mg Intracatheter Once PRN [name], MD      . amLODipine (NORVASC) tablet 10 mg  10 mg Oral Daily [name], DO   10 mg at 12/21/12 1040  . [START ON 12/26/2012] cloNIDine (CATAPRES - Dosed in mg/24 hr) patch 0.3 mg  0.3 mg Transdermal Weekly [name], DO      . cyclobenzaprine (FLEXERIL) tablet 7.5 mg  7.5 mg Oral TID PRN [name], NP   7.5 mg at 12/21/12 1218  . [START ON 12/22/2012] darbepoetin (ARANESP) injection 60 mcg  60 mcg Intravenous Q Wed-HD [name], PA-C       . doxercalciferol (HECTOROL) injection 6 mcg  6 mcg Intravenous Q M,W,F-HD [name], MD   6 mcg at 12/20/12 1759  . feeding supplement (NEPRO CARB STEADY) liquid 237 mL  237 mL Oral PRN [name], MD      . ferric gluconate (NULECIT) 62.5 mg in sodium chloride 0.9 % 100 mL IVPB  62.5 mg Intravenous Q Wed-HD [name], MD   62.5 mg at 12/20/12 1753  . heparin injection 1,000 Units  1,000 Units Dialysis PRN [name], MD      . heparin injection 1,800 Units  1,800 Units Dialysis Once in dialysis [name], MD      . heparin injection 5,000 Units  5,000 Units Subcutaneous Q8H [name], DO   5,000 Units at 12/20/12 2355  . lidocaine (PF) (XYLOCAINE) 1 % injection 5 mL  5 mL Intradermal PRN [name], MD      . lidocaine-prilocaine (EMLA) cream 1 application  1 application Topical PRN [name], MD      . lisinopril (PRINIVIL,ZESTRIL) tablet 40 mg  40 mg Oral QHS [name], DO   40 mg at 12/20/12  2058  . pantoprazole (PROTONIX) EC tablet 80 mg  80 mg Oral Q1200 [name], DO   80 mg at 12/21/12 1218  . pentafluoroprop-tetrafluoroeth (GEBAUERS) aerosol 1 application  1 application Topical PRN [name], MD      . sevelamer carbonate (RENVELA) tablet 4,000 mg  4,000 mg Oral TID WC [name], DO   4,000 mg at 12/21/12 1219  . sodium chloride 0.9 % injection 3 mL  3 mL Intravenous Q12H [name], DO   3 mL at 12/21/12 1040  . vancomycin (VANCOCIN) IVPB 750 mg/150 ml premix  750 mg Intravenous Q M,W,F-HD [name], RPH   750 mg at 12/20/12 2200    Pt meds include: Statin :No Betablocker: No ASA: No Other anticoagulants/antiplatelets:   Past Medical History  Diagnosis Date  . Chronic kidney disease   . Hypertension   . COPD (chronic obstructive pulmonary disease)   . Arthritis     GOUT  . Anemia   . Secondary hyperparathyroidism     Past Surgical History  Procedure Laterality Date  . Av fistula  placement  2009    Left lower arm AVF    Social History History  Substance Use Topics  . Smoking status: Current Every Day Smoker -- 0.50 packs/day    Types: Cigarettes  . Smokeless tobacco: Not on file  . Alcohol Use: Yes     Comment: occassional   Family History Father, grandfather, grand mother  HTN,  Mother family history unknown  Allergies  Allergen Reactions  . Aspirin     Tends to make him "feel worse" when he tries to take it for a cold or something   REVIEW OF SYSTEMS  General: [ ]  Weight loss, [x ] Fever, [x ] chills Neurologic: [ ]  Dizziness, [ ]  Blackouts, [ ]  Seizure [ ]  Stroke, [ ]  "Mini stroke", [ ]  Slurred speech, [ ]  Temporary blindness; [ ]  weakness in arms or legs, [ ]  Hoarseness [ ]  Dysphagia Cardiac: [ ]  Chest pain/pressure, [ ]  Shortness of breath at rest [ ]  Shortness of breath with exertion, [ ]  Atrial fibrillation or irregular heartbeat  Vascular: [ ]  Pain in legs with walking, [ ]  Pain in legs at rest, [ ]  Pain in legs at night,  [ ]  Non-healing ulcer, [ ]  Blood clot in vein/DVT,   Pulmonary: [ ]  Home oxygen, [ ]  Productive cough, [ ]  Coughing up blood, [ ]  Asthma,  [ ]  Wheezing [ ]  COPD Musculoskeletal:  [ ]  Arthritis, [ ]  Low back pain, [ ]  Joint pain Hematologic: [ ]  Easy Bruising, [ ]  Anemia; [ ]  Hepatitis Gastrointestinal: [ ]  Blood in stool, [ ]  Gastroesophageal Reflux/heartburn, Urinary: [x ] chronic Kidney disease, [ ]  on HD - [x ] MWF or [ ]  TTHS, [ ]  Burning with urination, [ ]  Difficulty urinating Skin: [ ]  Rashes, [ ]  Wounds Psychological: [ ]  Anxiety, [ ]  Depression  Physical Examination Filed Vitals:   12/20/12 1835 12/20/12 1916 12/21/12 0517 12/21/12 0810  BP: 151/92 139/84 147/88 135/80  Pulse: 93 87 80 87  Temp: 99 F (37.2 C) 99 F (37.2 C) 99.5 F (37.5 C) 98.5 F (36.9 C)  TempSrc: Oral Oral Oral Oral  Resp: 20 20 20 20   Height:      Weight: 169 lb 1.5 oz (76.7 kg) 165 lb 5 oz (74.985 kg)    SpO2: 100% 99% 100% 100%    Body mass index is  26.69 kg/(m^2).  General:  WDWN in NAD HENT: WNL Eyes: Pupils equal Pulmonary: normal non-labored breathing Cardiac: RRR, Abdomen: soft, NT, no masses Skin: no rashes, ulcers noted;  no Gangrene , no cellulitis; no open wounds;   Vascular Exam/Pulses:Palpable radial pulses.  Palpable thrill left upper arm AV fistula.  3 areas of  venous aneurysmal formation.  The superior one has thinner shiny skin without active bleeding or drainage.  There is no erythema or warmth to touch along the fistula.  Musculoskeletal: no muscle wasting or atrophy; no edema  Neurologic: A&O X 3; Appropriate Affect ;  SENSATION: normal; MOTOR FUNCTION: 5/5 Symmetric Speech is fluent/normal  Significant Diagnostic Studies: CBC Lab Results  Component Value Date   WBC 8.1 12/21/2012   HGB 8.5* 12/21/2012   HCT 25.3* 12/21/2012   MCV 98.1 12/21/2012   PLT 198 12/21/2012   BMET    Component Value Date/Time   NA 134* 12/20/2012 0430   K 4.7 12/20/2012 0430   CL 92* 12/20/2012 0430   CO2 26 12/20/2012 0430   GLUCOSE 107* 12/20/2012 0430   BUN 56* 12/20/2012 0430   CREATININE 14.73* 12/20/2012 0430   CALCIUM 9.5 12/20/2012 0430   CALCIUM 7.7* 02/18/2007 1522   GFRNONAA 3* 12/20/2012 0430   GFRAA 4* 12/20/2012 0430   Estimated Creatinine Clearance: 5.5 ml/min (by C-G formula based on Cr of 14.73).  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  Laurence Slate [facility] 12/21/2012 1:23 PM  Agree with above. This patient has had the left upper arm fistula since 2009. He has 3 enormous aneurysmal segments and his fistula. The most superior aneurysm has a small eschar present. He had presented with a bleeding episode from this  area. He also presented with a fever which has resolved on antibiotics. It is felt that the fistula could be a potential source of his fever.  On exam he has 3 large aneurysmal segments with an eschar over 1. There is some mild erythema over the most central aneurysm.  I think that he is at high risk for bleeding from the aneurysm with the eschar. I see no way to salvage the fistula. I have recommended ligation of the fistula and excision of the large aneurysms and placement of a tunneled dialysis catheter. I would be reluctant to place new access in his right arm and told there is no evidence of infection. He has some persistent cellulitis in the left upper arm. I have scheduled his surgery for tomorrow with Dr. [name]. I have asked the hemodialysis unit to dialyze him after his surgery tomorrow. He is currently on IV vancomycin. His hemoglobin is 8.5.  Deitra Mayo, MD, FACS [facility] [phone] 12/21/2012  Addendum. I spoke with Dr. Drucilla Schmidt. Given the positive blood cultures he would like [place] not to place a tunneled dialysis catheter tomorrow. We'll plan on placing this on Thursday. I have also spoken with the dialysis unit. He will dialyze at 6 AM for 4 hours and then have ligation of this fistula and excision of the aneurysms after dialysis. The tunneled dialysis catheter will be placed on Thursday.  Deitra Mayo, MD, [place] [phone] 12/21/2012

## 2012-12-21 NOTE — Consult Note (Signed)
[facility]    Date of Admission:  12/19/2012  Date of Consult:  12/21/2012  Reason for Consult: MRSA bacteremia and AV graft infection Referring Physician: Dr. [place] Pink   HPI: [facility] is an 47 y.o. male with ESRD on HD via AVF left side, who presented to the emergency room on 11/14 for bleeding from his HD catheter site. He states material was coming out "like a 2nd person was urinating next to me"  His bleeding was treated and he was discharged home. He presented back 48 hours later on 11/16 evening with complaints of fevers, chills and right ears. Initially there was some concern because he had a stiff neck, but this ended up being likely meningismus. Patient was started on broad-spectrum antibiotics. On 11/18 morning, patient's blood cultures grew out positive for gram-positive cocci in clusters--> MRSA this afternoon.   His AVF has been evaluated by [facility], Dr. Scot Dock and he was already planning on ligation of the fistula and excision of aneurysms due to concern of bleeding. I discussed also with Dr Scot Dock fact that pt now had MRSA in blood and that there was suspicion for AVF infection.   Pt has problems chronically with N and vomiting that is being worked up at [facility] after workup here by Dr. Paulita Fujita. He has lost 50# because of this.     Past Medical History  Diagnosis Date  . Chronic kidney disease   . Hypertension   . COPD (chronic obstructive pulmonary disease)   . Arthritis     GOUT  . Anemia   . Secondary hyperparathyroidism     Past Surgical History  Procedure Laterality Date  . Av fistula placement  2009    Left lower arm AVF  ergies:   Allergies  Allergen Reactions  . Aspirin     Tends to make him "feel worse" when he tries to take it for a cold or something     Medications: I have reviewed patients current medications as documented in Epic Anti-infectives   Start     Dose/Rate Route Frequency Ordered Stop   12/20/12 2200  cefTRIAXone (ROCEPHIN) 1 g in dextrose 5 % 50 mL IVPB  Status:  Discontinued     1 g 100 mL/hr over 30 Minutes Intravenous Every 24 hours 12/20/12 1420 12/21/12 0757   12/20/12 1200  vancomycin (VANCOCIN) IVPB 750 mg/150 ml premix     750 mg 150 mL/hr over 60 Minutes Intravenous Every M-W-F (Hemodialysis) 12/19/12 2111     12/19/12 2200  cefTRIAXone (ROCEPHIN) 2 g in dextrose 5 % 50 mL IVPB  Status:  Discontinued     2 g 100 mL/hr over 30 Minutes Intravenous Every 12 hours 12/19/12 2046 12/20/12 1420   12/19/12 2115  vancomycin (VANCOCIN) IVPB 750 mg/150 ml premix     750 mg 150 mL/hr over 60 Minutes Intravenous NOW 12/19/12 2111 12/19/12 2237   12/19/12 1645  piperacillin-tazobactam (ZOSYN) IVPB 3.375 g     3.375 g 12.5 mL/hr over 240 Minutes Intravenous  Once 12/19/12 1644 12/19/12 1751   12/19/12 1645  vancomycin (VANCOCIN) IVPB 1000 mg/200 mL premix     1,000 mg 200 mL/hr over 60 Minutes Intravenous  Once 12/19/12 1644 12/19/12 1952      Social History:  reports that he has been smoking Cigarettes.  He has been smoking about 0.50 packs per day. He does not have any smokeless tobacco history on file. He reports that he drinks alcohol.  He reports that he does not use illicit drugs.  History reviewed. No pertinent family history.  As in HPI and primary teams notes otherwise 12 point review of systems is negative  Blood pressure 159/89, pulse 106, temperature 99.1 F (37.3 C), temperature source Oral, resp. rate 22, height 5' 6"  (1.676 m), weight 165 lb 5 oz (74.985 kg), SpO2 97.00%. General: Alert and awake, oriented x3, not in any acute distress. HEENT: anicteric sclera, pupils reactive to light and accommodation, EOMI, oropharynx clear and without exudate CVS regular rate, normal r,  no murmur rubs or gallops Chest: clear to auscultation bilaterally, no wheezing, rales or rhonchi Abdomen:distended, normal bowel sounds, Extremities: AVF with induration around site  that had bled  Neuro: nonfocal, strength and sensation intact   Results for orders placed during the hospital encounter of 12/19/12 (from the past 48 hour(s))  CBC     Status: Abnormal   Collection Time    12/20/12  4:30 AM      Result Value Range   WBC 9.0  4.0 - 10.5 K/uL   RBC 2.77 (*) 4.22 - 5.81 MIL/uL   Hemoglobin 9.2 (*) 13.0 - 17.0 g/dL   HCT 26.9 (*) 39.0 - 52.0 %   MCV 97.1  78.0 - 100.0 fL   MCH 33.2  26.0 - 34.0 pg   MCHC 34.2  30.0 - 36.0 g/dL   RDW 13.1  11.5 - 15.5 %   Platelets 179  150 - 400 K/uL  BASIC METABOLIC PANEL     Status: Abnormal   Collection Time    12/20/12  4:30 AM      Result Value Range   Sodium 134 (*) 135 - 145 mEq/L   Potassium 4.7  3.5 - 5.1 mEq/L   Chloride 92 (*) 96 - 112 mEq/L   CO2 26  19 - 32 mEq/L   Glucose, Bld 107 (*) 70 - 99 mg/dL   BUN 56 (*) 6 - 23 mg/dL   Creatinine, Ser 14.73 (*) 0.50 - 1.35 mg/dL   Calcium 9.5  8.4 - 10.5 mg/dL   GFR calc non Af Amer 3 (*) >90 mL/min   GFR calc Af Amer 4 (*) >90 mL/min   Comment: (NOTE)     The eGFR has been calculated using the CKD EPI equation.     This calculation has not been validated in all clinical situations.     eGFR's persistently <90 mL/min signify possible Chronic Kidney     Disease.  MRSA PCR SCREENING     Status: None   Collection Time    12/20/12  5:46 AM      Result Value Range   MRSA by PCR NEGATIVE  NEGATIVE   Comment:            The GeneXpert MRSA Assay (FDA     approved for NASAL specimens     only), is one component of a     comprehensive MRSA colonization     surveillance program. It is not     intended to diagnose MRSA     infection nor to guide or     monitor treatment for     MRSA infections.  CBC     Status: Abnormal   Collection Time    12/21/12  7:14 AM      Result Value Range   WBC 8.1  4.0 - 10.5 K/uL   RBC 2.58 (*) 4.22 - 5.81 MIL/uL   Hemoglobin 8.5 (*) 13.0 -  17.0 g/dL   HCT 25.3 (*) 39.0 - 52.0 %   MCV 98.1  78.0 - 100.0 fL   MCH 32.9   26.0 - 34.0 pg   MCHC 33.6  30.0 - 36.0 g/dL   RDW 13.1  11.5 - 15.5 %   Platelets 198  150 - 400 K/uL      Component Value Date/Time   SDES BLOOD RIGHT FOREARM 12/19/2012 1700   SPECREQUEST BOTTLES DRAWN AEROBIC AND ANAEROBIC 10CC BLUE 5CC RED 12/19/2012 1700   CULT  Value: METHICILLIN RESISTANT STAPHYLOCOCCUS AUREUS Note: RIFAMPIN AND GENTAMICIN SHOULD NOT BE USED AS SINGLE DRUGS FOR TREATMENT OF STAPH INFECTIONS. CRITICAL RESULT CALLED TO, READ BACK BY AND VERIFIED WITH: ASHLEY TONEY 12/21/12 1356 BY SMITHERJ Note: Gram Stain Report Called to,Read Back By and Verified With: Rocco Serene ON 12/20/2012 AT 8:50P BY WILEJ Performed at [facility] 12/19/2012 1700   REPTSTATUS PENDING 12/19/2012 1700   No results found.   Recent Results (from the past 720 hour(s))  CULTURE, BLOOD (ROUTINE X 2)     Status: None   Collection Time    12/19/12 12:50 AM      Result Value Range Status   Specimen Description BLOOD RIGHT ARM   Final   Special Requests BOTTLES DRAWN AEROBIC AND ANAEROBIC 10CC EACH   Final   Culture  Setup Time     Final   Value: 12/20/2012 08:13     Performed at [facility]   Culture     Final   Value:        BLOOD CULTURE RECEIVED NO GROWTH TO DATE CULTURE WILL BE HELD FOR 5 DAYS BEFORE ISSUING A FINAL NEGATIVE REPORT     Performed at [facility]   Report Status PENDING   Incomplete  CULTURE, BLOOD (ROUTINE X 2)     Status: None   Collection Time    12/19/12  5:00 PM      Result Value Range Status   Specimen Description BLOOD RIGHT FOREARM   Final   Special Requests     Final   Value: BOTTLES DRAWN AEROBIC AND ANAEROBIC 10CC BLUE 5CC RED   Culture  Setup Time     Final   Value: 12/20/2012 04:30     Performed at [facility]   Culture     Final   Value: METHICILLIN RESISTANT STAPHYLOCOCCUS AUREUS     Note: RIFAMPIN AND GENTAMICIN SHOULD NOT BE USED AS SINGLE DRUGS FOR TREATMENT OF STAPH INFECTIONS. CRITICAL RESULT CALLED TO, READ BACK  BY AND VERIFIED WITH: ASHLEY TONEY 12/21/12 1356 BY SMITHERJ     Note: Gram Stain Report Called to,Read Back By and Verified With: KATHY WICKER ON 12/20/2012 AT 8:50P BY WILEJ     Performed at [facility]   Report Status PENDING   Incomplete  MRSA PCR SCREENING     Status: None   Collection Time    12/20/12  5:46 AM      Result Value Range Status   MRSA by PCR NEGATIVE  NEGATIVE Final   Comment:            The GeneXpert MRSA Assay (FDA     approved for NASAL specimens     only), is one component of a     comprehensive MRSA colonization     surveillance program. It is not     intended to diagnose MRSA     infection nor to guide or  monitor treatment for     MRSA infections.     Impression/Recommendation  47 year old with ESRD via AVF in left arm with recent bleeding from AVF drainage admitted with fevers, rigors and found to have MRSA in the blood.   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 Doren Custard.  --I. would recommend getting intraoperative cultures from the AV fistula --After resection of his aneurysms and ligation of his fistula, I recommend delaying insertion of a new hemodialysis catheter until we can clear his blood. IF we could even delay to Thursday or Friday new HD that would be beneficial from ID standpoint  --I've ordered repeat blood cultures today and will order another set tomorrow after he has his AV fistula surgery complete  --I. am ordering a 2-D echocardiogram  --Will also consider getting a transesophageal echocardiogram to evaluate for endocarditis, especially if the 2-D echocardiogram is unrevealing which is likely given his body habitus.  --I would treat him for 6 weeks with IV vancomycin  I spent greater than 60 minutes with the patient including greater than 50% of time in face to face counsel of the patient and in coordination of their care.    #2  Screening: check for HIV and Hep A and C   Thank you so much for this interesting consult  [place] for [place] [phone] (pager) [phone] (office) 12/21/2012, 5:44 PM  [place] 12/21/2012, 5:44 PM

## 2012-12-21 NOTE — Progress Notes (Signed)
[facility] PROGRESS NOTE  James Wall WNI:[id number] DOB: [birth year] DOA: 12/19/2012 PCP: No PCP Per Patient  Interim summary:  Patient is a 47 year old male with past medical history of hypertension and end-stage renal disease on hemodialysis who presented to the emergency room on 11/14 for bleeding from his HD catheter site. His bleeding was treated and he was discharged home. He presented back 48 hours later on 11/16 evening with complaints of fevers, chills and right ears. Initially there was some concern because he had a stiff neck, but this ended up being likely meningismus. Patient was started on broad-spectrum antibiotics. On 11/18 morning, patient's blood cultures grew out positive for gram-positive cocci in clusters.   Assessment/Plan: Principal Problem:   Sepsis: Likely suspect is HD catheter site, infectious disease feels this may possibly be contaminant given only one bottle. We'll consult infectious disease. Stop Rocephin. Continue vancomycin. We'll discuss whether echocardiogram is warranted as well as catheter access issues.  Active Problems:   HYPERTENSION: Pressure stable. Continue home meds.    End stage renal disease: Nephrology following. For dialysis tomorrow.    GERD (gastroesophageal reflux disease) continue PPI  Anemia of chronic disease: Secondary to renal disease. Stable.  Code Status: Full code  Family Communication: Left messages family   Disposition Plan: Likely will be here for several more days pending further workup   Consultants:  Nephrology  Infectious disease  Procedures:  None  Antibiotics:  Zosyn: One dose on 11/16  Rocephin: 11/16-11/18  Vancomycin 11/16- present  HPI/Subjective: Patient doing okay. Complains of a mild nonproductive cough. No shortness of breath, feels better after several days of antibiotics  Objective: Filed Vitals:   12/21/12 0517  BP: 147/88  Pulse: 80  Temp: 99.5 F (37.5 C)  Resp: 20     Intake/Output Summary (Last 24 hours) at 12/21/12 0753 Last data filed at 12/21/12 0523  Gross per 24 hour  Intake    600 ml  Output    552 ml  Net     48 ml   Filed Weights   12/20/12 1535 12/20/12 1835 12/20/12 1916  Weight: 77.5 kg (170 lb 13.7 oz) 76.7 kg (169 lb 1.5 oz) 74.985 kg (165 lb 5 oz)    Exam:   General:  Alert and oriented x3, no acute distress  Cardiovascular: Regular rate and rhythm, F8-H8, soft 2/6 systolic ejection murmur  Respiratory: Clear to auscultation bilaterally  Abdomen: Soft, nontender, nondistended, positive bowel sounds  Musculoskeletal: No clubbing or cyanosis, trace pitting edema, left upper extremity fistula nontender with no surrounding erythema. A bandage is on earlier bleeding site  Data Reviewed: Basic Metabolic Panel:  Recent Labs Lab 12/19/12 1700 12/20/12 0430  NA 134* 134*  K 5.1 4.7  CL 90* 92*  CO2 24 26  GLUCOSE 124* 107*  BUN 48* 56*  CREATININE 13.22* 14.73*  CALCIUM 10.0 9.5   Liver Function Tests:  Recent Labs Lab 12/19/12 1700  AST 12  ALT 7  ALKPHOS 51  BILITOT 0.8  PROT 8.1  ALBUMIN 3.3*   No results found for this basename: LIPASE, AMYLASE,  in the last 168 hours No results found for this basename: AMMONIA,  in the last 168 hours CBC:  Recent Labs Lab 12/19/12 1700 12/20/12 0430  WBC 12.8* 9.0  HGB 10.8* 9.2*  HCT 31.7* 26.9*  MCV 98.4 97.1  PLT 198 179   Cardiac Enzymes: No results found for this basename: CKTOTAL, CKMB, CKMBINDEX, TROPONINI,  in the last 168 hours  BNP (last 3 results) No results found for this basename: PROBNP,  in the last 8760 hours CBG: No results found for this basename: GLUCAP,  in the last 168 hours  Recent Results (from the past 240 hour(s))  CULTURE, BLOOD (ROUTINE X 2)     Status: None   Collection Time    12/19/12  5:00 PM      Result Value Range Status   Specimen Description BLOOD RIGHT FOREARM   Final   Special Requests     Final   Value: BOTTLES  DRAWN AEROBIC AND ANAEROBIC 10CC BLUE 5CC RED   Culture  Setup Time     Final   Value: 12/20/2012 04:30     Performed at [facility]   Culture     Final   Value: GRAM POSITIVE COCCI IN CLUSTERS     Note: Gram Stain Report Called to,Read Back By and Verified With: KATHY WICKER ON 12/20/2012 AT 8:50P BY WILEJ     Performed at [facility]   Report Status PENDING   Incomplete  MRSA PCR SCREENING     Status: None   Collection Time    12/20/12  5:46 AM      Result Value Range Status   MRSA by PCR NEGATIVE  NEGATIVE Final   Comment:            The GeneXpert MRSA Assay (FDA     approved for NASAL specimens     only), is one component of a     comprehensive MRSA colonization     surveillance program. It is not     intended to diagnose MRSA     infection nor to guide or     monitor treatment for     MRSA infections.     Studies: Dg Chest 2 View  12/19/2012   CLINICAL DATA:  Weakness, chills  EXAM: CHEST  2 VIEW  COMPARISON:  03/28/2012  FINDINGS: No focal pulmonary opacity allowing for degree of hypoaeration. Heart size is mildly enlarged with central vascular congestion but no overt edema. No pleural effusion. No acute osseous finding.  IMPRESSION: Cardiomegaly with central congestion but no focal acute finding.   Electronically Signed   By: Conchita Paris M.D.   On: 12/19/2012 17:35    Scheduled Meds: . amLODipine  10 mg Oral Daily  . cefTRIAXone (ROCEPHIN)  IV  1 g Intravenous Q24H  . [START ON 12/26/2012] cloNIDine  0.3 mg Transdermal Weekly  . [START ON 12/22/2012] darbepoetin  25 mcg Intravenous Q Wed-HD  . doxercalciferol  6 mcg Intravenous Q M,W,F-HD  . ferric gluconate (FERRLECIT/NULECIT) IV  62.5 mg Intravenous Q Wed-HD  . heparin  1,800 Units Dialysis Once in dialysis  . heparin  5,000 Units Subcutaneous Q8H  . lisinopril  40 mg Oral QHS  . pantoprazole  80 mg Oral Q1200  . sevelamer carbonate  4,000 mg Oral TID WC  . sodium chloride  3 mL Intravenous  Q12H  . vancomycin  750 mg Intravenous Q M,W,F-HD   Continuous Infusions:   Principal Problem:   Sepsis Active Problems:   HYPERTENSION   End stage renal disease   GERD (gastroesophageal reflux disease)    Time spent: 25 minutes    [facility] Pager [phone]. If 7PM-7AM, please contact night-coverage at [email or web address], password [facility] 12/21/2012, 7:53 AM  LOS: 2 days

## 2012-12-21 NOTE — Progress Notes (Signed)
[facility] notified that blood cultures were positive for MRSA. MD was notified. Awaiting orders.

## 2012-12-22 ENCOUNTER — Inpatient Hospital Stay (HOSPITAL_COMMUNITY): Payer: Medicare Other | Admitting: Certified Registered"

## 2012-12-22 ENCOUNTER — Encounter (HOSPITAL_COMMUNITY)
Admission: EM | Disposition: A | Discharge status: Home / Self Care | Payer: Self-pay | Source: Home / Self Care | Attending: Internal Medicine

## 2012-12-22 DIAGNOSIS — Z113 Encounter for screening for infections with a predominantly sexual mode of transmission: Secondary | ICD-10-CM

## 2012-12-22 DIAGNOSIS — N186 End stage renal disease: Secondary | ICD-10-CM | POA: Diagnosis not present

## 2012-12-22 HISTORY — PX: LIGATION OF ARTERIOVENOUS  FISTULA: SHX5948

## 2012-12-22 LAB — GLUCOSE, CAPILLARY: Glucose-Capillary: 82 mg/dL (ref 70–99)

## 2012-12-22 LAB — RENAL FUNCTION PANEL
Albumin: 2.8 g/dL — ABNORMAL LOW (ref 3.5–5.2)
BUN: 44 mg/dL — ABNORMAL HIGH (ref 6–23)
Chloride: 96 mEq/L (ref 96–112)
Creatinine, Ser: 13.52 mg/dL — ABNORMAL HIGH (ref 0.50–1.35)
GFR calc non Af Amer: 4 mL/min — ABNORMAL LOW (ref >90)
Glucose, Bld: 97 mg/dL (ref 70–99)
Phosphorus: 4.4 mg/dL (ref 2.3–4.6)
Potassium: 4.4 mEq/L (ref 3.5–5.1)

## 2012-12-22 LAB — CULTURE, BLOOD (ROUTINE X 2)

## 2012-12-22 LAB — CBC
HCT: 24.5 % — ABNORMAL LOW (ref 39.0–52.0)
MCHC: 33.5 g/dL (ref 30.0–36.0)
MCV: 97.6 fL (ref 78.0–100.0)
Platelets: 230 10*3/uL (ref 150–400)
RBC: 2.51 MIL/uL — ABNORMAL LOW (ref 4.22–5.81)
RDW: 13.1 % (ref 11.5–15.5)
WBC: 8.4 10*3/uL (ref 4.0–10.5)

## 2012-12-22 LAB — HIV ANTIBODY (ROUTINE TESTING W REFLEX): HIV: NONREACTIVE

## 2012-12-22 LAB — SURGICAL PCR SCREEN: MRSA, PCR: NEGATIVE

## 2012-12-22 LAB — HEPATITIS A ANTIBODY, TOTAL: Hep A Total Ab: NONREACTIVE

## 2012-12-22 LAB — HEPATITIS C ANTIBODY (REFLEX): HCV Ab: NEGATIVE

## 2012-12-22 SURGERY — LIGATION OF ARTERIOVENOUS  FISTULA
Anesthesia: General | Site: Arm Upper | Laterality: Left | Wound class: Clean

## 2012-12-22 MED ORDER — ONDANSETRON HCL 4 MG/2ML IJ SOLN: 4.0000 mg | Freq: Once | INTRAMUSCULAR | Status: DC | PRN

## 2012-12-22 MED ORDER — ONDANSETRON HCL 4 MG/2ML IJ SOLN
INTRAMUSCULAR | Status: DC | PRN
  Administered 2012-12-22: 4 mg via INTRAVENOUS

## 2012-12-22 MED ORDER — SODIUM CHLORIDE 0.9 % IV SOLN
INTRAVENOUS | Status: DC | PRN
  Administered 2012-12-22 (×2): via INTRAVENOUS

## 2012-12-22 MED ORDER — DARBEPOETIN ALFA-POLYSORBATE 60 MCG/0.3ML IJ SOLN
INTRAMUSCULAR | Status: AC
  Administered 2012-12-22: 60 ug
  Filled 2012-12-22: qty 0.3

## 2012-12-22 MED ORDER — DOXERCALCIFEROL 4 MCG/2ML IV SOLN
INTRAVENOUS | Status: AC
  Administered 2012-12-22: 6 ug via INTRAVENOUS
  Filled 2012-12-22: qty 4

## 2012-12-22 MED ORDER — 0.9 % SODIUM CHLORIDE (POUR BTL) OPTIME
TOPICAL | Status: DC | PRN
  Administered 2012-12-22: 1000 mL

## 2012-12-22 MED ORDER — SODIUM CHLORIDE 0.9 % IR SOLN
Status: DC | PRN
  Administered 2012-12-22: 15:00:00

## 2012-12-22 MED ORDER — HYDROMORPHONE HCL PF 1 MG/ML IJ SOLN
INTRAMUSCULAR | Status: AC
  Administered 2012-12-22: 0.5 mg via INTRAVENOUS
  Filled 2012-12-22: qty 1

## 2012-12-22 MED ORDER — LIDOCAINE HCL (CARDIAC) 20 MG/ML IV SOLN
INTRAVENOUS | Status: DC | PRN
  Administered 2012-12-22: 100 mg via INTRAVENOUS

## 2012-12-22 MED ORDER — LIDOCAINE HCL (PF) 1 % IJ SOLN
INTRAMUSCULAR | Status: AC
  Filled 2012-12-22: qty 30

## 2012-12-22 MED ORDER — HYDROMORPHONE HCL PF 1 MG/ML IJ SOLN
0.2500 mg | INTRAMUSCULAR | Status: DC | PRN
  Administered 2012-12-22 (×3): 0.5 mg via INTRAVENOUS

## 2012-12-22 MED ORDER — PHENYLEPHRINE HCL 10 MG/ML IJ SOLN
10.0000 mg | INTRAVENOUS | Status: DC | PRN
  Administered 2012-12-22: 10 ug/min via INTRAVENOUS

## 2012-12-22 MED ORDER — MIDAZOLAM HCL 5 MG/5ML IJ SOLN
INTRAMUSCULAR | Status: DC | PRN
  Administered 2012-12-22: 2 mg via INTRAVENOUS

## 2012-12-22 MED ORDER — PROPOFOL 10 MG/ML IV BOLUS
INTRAVENOUS | Status: DC | PRN
  Administered 2012-12-22: 150 mg via INTRAVENOUS

## 2012-12-22 MED ORDER — FENTANYL CITRATE 0.05 MG/ML IJ SOLN
INTRAMUSCULAR | Status: DC | PRN
  Administered 2012-12-22: 250 ug via INTRAVENOUS
  Administered 2012-12-22: 150 ug via INTRAVENOUS
  Administered 2012-12-22: 50 ug via INTRAVENOUS

## 2012-12-22 MED ORDER — PHENYLEPHRINE HCL 10 MG/ML IJ SOLN
INTRAMUSCULAR | Status: DC | PRN
  Administered 2012-12-22: 80 ug via INTRAVENOUS
  Administered 2012-12-22: 40 ug via INTRAVENOUS
  Administered 2012-12-22: 80 ug via INTRAVENOUS

## 2012-12-22 MED ORDER — THROMBIN 20000 UNITS EX SOLR
CUTANEOUS | Status: AC
  Filled 2012-12-22: qty 20000

## 2012-12-22 SURGICAL SUPPLY — 45 items
ADH SKN CLS APL DERMABOND .7 (GAUZE/BANDAGES/DRESSINGS) ×1
BANDAGE ELASTIC 4 VELCRO ST LF (GAUZE/BANDAGES/DRESSINGS) ×1 IMPLANT
BANDAGE GAUZE ELAST BULKY 4 IN (GAUZE/BANDAGES/DRESSINGS) ×1 IMPLANT
CANISTER SUCTION 2500CC (MISCELLANEOUS) ×2 IMPLANT
CLIP TI LARGE 6 (CLIP) ×1 IMPLANT
CLIP TI MEDIUM LARGE 6 (CLIP) ×1 IMPLANT
COVER SURGICAL LIGHT HANDLE (MISCELLANEOUS) ×2 IMPLANT
DERMABOND ADVANCED (GAUZE/BANDAGES/DRESSINGS) ×1
DERMABOND ADVANCED .7 DNX12 (GAUZE/BANDAGES/DRESSINGS) ×1 IMPLANT
ELECT REM PT RETURN 9FT ADLT (ELECTROSURGICAL) ×2
ELECTRODE REM PT RTRN 9FT ADLT (ELECTROSURGICAL) ×1 IMPLANT
GEL ULTRASOUND 20GR AQUASONIC (MISCELLANEOUS) ×1 IMPLANT
GLOVE BIO SURGEON STRL SZ 6.5 (GLOVE) ×1 IMPLANT
GLOVE BIO SURGEON STRL SZ7.5 (GLOVE) ×2 IMPLANT
GLOVE BIOGEL PI IND STRL 6.5 (GLOVE) IMPLANT
GLOVE BIOGEL PI IND STRL 7.0 (GLOVE) IMPLANT
GLOVE BIOGEL PI IND STRL 7.5 (GLOVE) IMPLANT
GLOVE BIOGEL PI IND STRL 8 (GLOVE) IMPLANT
GLOVE BIOGEL PI INDICATOR 6.5 (GLOVE) ×2
GLOVE BIOGEL PI INDICATOR 7.0 (GLOVE) ×1
GLOVE BIOGEL PI INDICATOR 7.5 (GLOVE) ×1
GLOVE BIOGEL PI INDICATOR 8 (GLOVE) ×1
GLOVE SS BIOGEL STRL SZ 7 (GLOVE) IMPLANT
GLOVE SS N UNI LF 6.5 STRL (GLOVE) ×1 IMPLANT
GLOVE SUPERSENSE BIOGEL SZ 7 (GLOVE) ×1
GOWN PREVENTION PLUS XLARGE (GOWN DISPOSABLE) ×2 IMPLANT
GOWN STRL NON-REIN LRG LVL3 (GOWN DISPOSABLE) ×5 IMPLANT
KIT BASIN OR (CUSTOM PROCEDURE TRAY) ×2 IMPLANT
KIT ROOM TURNOVER OR (KITS) ×2 IMPLANT
LOOP VESSEL MINI RED (MISCELLANEOUS) ×1 IMPLANT
NS IRRIG 1000ML POUR BTL (IV SOLUTION) ×2 IMPLANT
PACK CV ACCESS (CUSTOM PROCEDURE TRAY) ×2 IMPLANT
PAD ARMBOARD 7.5X6 YLW CONV (MISCELLANEOUS) ×4 IMPLANT
SPONGE SURGIFOAM ABS GEL 100 (HEMOSTASIS) IMPLANT
SUT PROLENE 5 0 C 1 24 (SUTURE) ×1 IMPLANT
SUT PROLENE 6 0 CC (SUTURE) ×1 IMPLANT
SUT PROLENE 7 0 BV 1 (SUTURE) ×1 IMPLANT
SUT SILK 0 (SUTURE) IMPLANT
SUT VIC AB 3-0 SH 27 (SUTURE) ×4
SUT VIC AB 3-0 SH 27X BRD (SUTURE) ×1 IMPLANT
SUT VICRYL 4-0 PS2 18IN ABS (SUTURE) ×2 IMPLANT
TOWEL OR 17X24 6PK STRL BLUE (TOWEL DISPOSABLE) ×2 IMPLANT
TOWEL OR 17X26 10 PK STRL BLUE (TOWEL DISPOSABLE) ×2 IMPLANT
UNDERPAD 30X30 INCONTINENT (UNDERPADS AND DIAPERS) ×2 IMPLANT
WATER STERILE IRR 1000ML POUR (IV SOLUTION) ×2 IMPLANT

## 2012-12-22 NOTE — Progress Notes (Signed)
ANTIBIOTIC CONSULT NOTE - Follow up  Pharmacy Consult for vancomycin Indication: MRSA bacteremia   Allergies  Allergen Reactions  . Aspirin     Tends to make him "feel worse" when he tries to take it for a cold or something    Patient Measurements: weight= 78 kg, height= 66 inches (per patient) Height: 5' 6"  (167.6 cm) Weight: 171 lb 1.2 oz (77.6 kg) (standing wt) IBW/kg (Calculated) : 63.8  Vital Signs: Temp: 98.7 F (37.1 C) (11/19 0656) Temp src: Oral (11/19 0656) BP: 156/96 mmHg (11/19 1045) Pulse Rate: 107 (11/19 1045) Intake/Output from previous day: 11/18 0701 - 11/19 0700 In: 360 [P.O.:360] Out: -  Intake/Output from this shift:    Labs:  Recent Labs  12/19/12 1700 12/20/12 0430 12/21/12 0714 12/22/12 0659  WBC 12.8* 9.0 8.1 8.4  HGB 10.8* 9.2* 8.5* 8.2*  PLT 198 179 198 230  CREATININE 13.22* 14.73*  --  13.52*   Estimated Creatinine Clearance: 6.5 ml/min (by C-G formula based on Cr of 13.52). No results found for this basename: VANCOTROUGH, VANCOPEAK, VANCORANDOM, GENTTROUGH, GENTPEAK, GENTRANDOM, TOBRATROUGH, TOBRAPEAK, TOBRARND, AMIKACINPEAK, AMIKACINTROU, AMIKACIN,  in the last 72 hours   Assessment: Patient is a 47 y.o M with ESRD who was here for bleeding from his fistula site.  He returned to the ED with c/o chills, fever, and tachycardia. 1/2 Blood Cx positive for MRSA likely due to AVF infection per ID. Repeat cultures taken. MWF HD. Patient currently in HD and seems to be tolerating it well (Blood flow rate of 450 mL/min). Plan is for ligation of fistula after HD with placement of tunneled dialysis catheter.  Goal of Therapy:  Pre-HD vancomycin level= 15-25  Plan:  1) Continue Vancomycin 750 mg IV with HD Q MWF x 6 weeks per ID 2) F/u repeat cultures, CBC and patient clinical status  3) Collect vanc levels when appropriate   Albertina Parr, PharmD.  [place] License #[id number] Application for [place] reciprocity pending  Clinical  Pharmacist Pager [phone]

## 2012-12-22 NOTE — H&P (View-Only) (Signed)
[facility] NOTE  MRN : [id number]  Reason for Consult: AV fistula aneurysmal formation  Referring Physician: Dr. Melvia [name]  History of Present Illness:a 47 y.o. male who presents to the ED with c/o fevers, chills, rigors. He has a h/o ESRD with dialysis MWF.   He came to ED on 11/14 for bleed from avf after removing bandage, bleeding controlled and he was  d/c'd home. Returned to ED on 11/16 with new onset chills and feeling bad. In ED temp was 105.  This AV fistula was placed by Dr. [name] in March of 2009. He has had an outstanding use of this as this time. 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  He was last seen in our office 06/23/2012.  He was having no difficulty using the fistula and was put on a follow up as needed basis.  We have been consulted to re-access his fistula.  Current Facility-Administered Medications  Medication Dose Route Frequency Provider Last Rate Last Dose  . 0.9 %  sodium chloride infusion  100 mL Intravenous PRN [name], MD      . 0.9 %  sodium chloride infusion  100 mL Intravenous PRN [name], MD      . acetaminophen (TYLENOL) tablet 650 mg  650 mg Oral Q6H PRN [name], DO   650 mg at 12/20/12 1454   Or  . acetaminophen (TYLENOL) suppository 650 mg  650 mg Rectal Q6H PRN [name], DO      . alteplase (CATHFLO ACTIVASE) injection 2 mg  2 mg Intracatheter Once PRN [name], MD      . amLODipine (NORVASC) tablet 10 mg  10 mg Oral Daily [name], DO   10 mg at 12/21/12 1040  . [START ON 12/26/2012] cloNIDine (CATAPRES - Dosed in mg/24 hr) patch 0.3 mg  0.3 mg Transdermal Weekly [name], DO      . cyclobenzaprine (FLEXERIL) tablet 7.5 mg  7.5 mg Oral TID PRN [name], NP   7.5 mg at 12/21/12 1218  . [START ON 12/22/2012] darbepoetin (ARANESP) injection 60 mcg  60 mcg Intravenous Q Wed-HD [name], PA-C       . doxercalciferol (HECTOROL) injection 6 mcg  6 mcg Intravenous Q M,W,F-HD [name], MD   6 mcg at 12/20/12 1759  . feeding supplement (NEPRO CARB STEADY) liquid 237 mL  237 mL Oral PRN [name], MD      . ferric gluconate (NULECIT) 62.5 mg in sodium chloride 0.9 % 100 mL IVPB  62.5 mg Intravenous Q Wed-HD [name], MD   62.5 mg at 12/20/12 1753  . heparin injection 1,000 Units  1,000 Units Dialysis PRN [name], MD      . heparin injection 1,800 Units  1,800 Units Dialysis Once in dialysis [name], MD      . heparin injection 5,000 Units  5,000 Units Subcutaneous Q8H [name], DO   5,000 Units at 12/20/12 0086  . lidocaine (PF) (XYLOCAINE) 1 % injection 5 mL  5 mL Intradermal PRN [name], MD      . lidocaine-prilocaine (EMLA) cream 1 application  1 application Topical PRN [name], MD      . lisinopril (PRINIVIL,ZESTRIL) tablet 40 mg  40 mg Oral QHS [name], DO   40 mg at 12/20/12  2058  . pantoprazole (PROTONIX) EC tablet 80 mg  80 mg Oral Q1200 [name], DO   80 mg at 12/21/12 1218  . pentafluoroprop-tetrafluoroeth (GEBAUERS) aerosol 1 application  1 application Topical PRN [name], MD      . sevelamer carbonate (RENVELA) tablet 4,000 mg  4,000 mg Oral TID WC [name], DO   4,000 mg at 12/21/12 1219  . sodium chloride 0.9 % injection 3 mL  3 mL Intravenous Q12H [name], DO   3 mL at 12/21/12 1040  . vancomycin (VANCOCIN) IVPB 750 mg/150 ml premix  750 mg Intravenous Q M,W,F-HD [name], RPH   750 mg at 12/20/12 2200    Pt meds include: Statin :No Betablocker: No ASA: No Other anticoagulants/antiplatelets:   Past Medical History  Diagnosis Date  . Chronic kidney disease   . Hypertension   . COPD (chronic obstructive pulmonary disease)   . Arthritis     GOUT  . Anemia   . Secondary hyperparathyroidism     Past Surgical History  Procedure Laterality Date  . Av fistula  placement  2009    Left lower arm AVF    Social History History  Substance Use Topics  . Smoking status: Current Every Day Smoker -- 0.50 packs/day    Types: Cigarettes  . Smokeless tobacco: Not on file  . Alcohol Use: Yes     Comment: occassional   Family History Father, grandfather, grand mother  HTN,  Mother family history unknown  Allergies  Allergen Reactions  . Aspirin     Tends to make him "feel worse" when he tries to take it for a cold or something   REVIEW OF SYSTEMS  General: [ ]  Weight loss, [x ] Fever, [x ] chills Neurologic: [ ]  Dizziness, [ ]  Blackouts, [ ]  Seizure [ ]  Stroke, [ ]  "Mini stroke", [ ]  Slurred speech, [ ]  Temporary blindness; [ ]  weakness in arms or legs, [ ]  Hoarseness [ ]  Dysphagia Cardiac: [ ]  Chest pain/pressure, [ ]  Shortness of breath at rest [ ]  Shortness of breath with exertion, [ ]  Atrial fibrillation or irregular heartbeat  Vascular: [ ]  Pain in legs with walking, [ ]  Pain in legs at rest, [ ]  Pain in legs at night,  [ ]  Non-healing ulcer, [ ]  Blood clot in vein/DVT,   Pulmonary: [ ]  Home oxygen, [ ]  Productive cough, [ ]  Coughing up blood, [ ]  Asthma,  [ ]  Wheezing [ ]  COPD Musculoskeletal:  [ ]  Arthritis, [ ]  Low back pain, [ ]  Joint pain Hematologic: [ ]  Easy Bruising, [ ]  Anemia; [ ]  Hepatitis Gastrointestinal: [ ]  Blood in stool, [ ]  Gastroesophageal Reflux/heartburn, Urinary: [x ] chronic Kidney disease, [ ]  on HD - [x ] MWF or [ ]  TTHS, [ ]  Burning with urination, [ ]  Difficulty urinating Skin: [ ]  Rashes, [ ]  Wounds Psychological: [ ]  Anxiety, [ ]  Depression  Physical Examination Filed Vitals:   12/20/12 1835 12/20/12 1916 12/21/12 0517 12/21/12 0810  BP: 151/92 139/84 147/88 135/80  Pulse: 93 87 80 87  Temp: 99 F (37.2 C) 99 F (37.2 C) 99.5 F (37.5 C) 98.5 F (36.9 C)  TempSrc: Oral Oral Oral Oral  Resp: 20 20 20 20   Height:      Weight: 169 lb 1.5 oz (76.7 kg) 165 lb 5 oz (74.985 kg)    SpO2: 100% 99% 100% 100%    Body mass index is  26.69 kg/(m^2).  General:  WDWN in NAD HENT: WNL Eyes: Pupils equal Pulmonary: normal non-labored breathing Cardiac: RRR, Abdomen: soft, NT, no masses Skin: no rashes, ulcers noted;  no Gangrene , no cellulitis; no open wounds;   Vascular Exam/Pulses:Palpable radial pulses.  Palpable thrill left upper arm AV fistula.  3 areas of  venous aneurysmal formation.  The superior one has thinner shiny skin without active bleeding or drainage.  There is no erythema or warmth to touch along the fistula.  Musculoskeletal: no muscle wasting or atrophy; no edema  Neurologic: A&O X 3; Appropriate Affect ;  SENSATION: normal; MOTOR FUNCTION: 5/5 Symmetric Speech is fluent/normal  Significant Diagnostic Studies: CBC Lab Results  Component Value Date   WBC 8.1 12/21/2012   HGB 8.5* 12/21/2012   HCT 25.3* 12/21/2012   MCV 98.1 12/21/2012   PLT 198 12/21/2012   BMET    Component Value Date/Time   NA 134* 12/20/2012 0430   K 4.7 12/20/2012 0430   CL 92* 12/20/2012 0430   CO2 26 12/20/2012 0430   GLUCOSE 107* 12/20/2012 0430   BUN 56* 12/20/2012 0430   CREATININE 14.73* 12/20/2012 0430   CALCIUM 9.5 12/20/2012 0430   CALCIUM 7.7* 02/18/2007 1522   GFRNONAA 3* 12/20/2012 0430   GFRAA 4* 12/20/2012 0430   Estimated Creatinine Clearance: 5.5 ml/min (by C-G formula based on Cr of 14.73).  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  Laurence Slate [facility] 12/21/2012 1:23 PM  Agree with above. This patient has had the left upper arm fistula since 2009. He has 3 enormous aneurysmal segments and his fistula. The most superior aneurysm has a small eschar present. He had presented with a bleeding episode from this  area. He also presented with a fever which has resolved on antibiotics. It is felt that the fistula could be a potential source of his fever.  On exam he has 3 large aneurysmal segments with an eschar over 1. There is some mild erythema over the most central aneurysm.  I think that he is at high risk for bleeding from the aneurysm with the eschar. I see no way to salvage the fistula. I have recommended ligation of the fistula and excision of the large aneurysms and placement of a tunneled dialysis catheter. I would be reluctant to place new access in his right arm and told there is no evidence of infection. He has some persistent cellulitis in the left upper arm. I have scheduled his surgery for tomorrow with Dr. [name]. I have asked the hemodialysis unit to dialyze him after his surgery tomorrow. He is currently on IV vancomycin. His hemoglobin is 8.5.  Deitra Mayo, MD, FACS Beeper [phone] 12/21/2012  Addendum. I spoke with Dr. Drucilla Schmidt. Given the positive blood cultures he would like [place] not to place a tunneled dialysis catheter tomorrow. We'll plan on placing this on Thursday. I have also spoken with the dialysis unit. He will dialyze at 6 AM for 4 hours and then have ligation of this fistula and excision of the aneurysms after dialysis. The tunneled dialysis catheter will be placed on Thursday.  Deitra Mayo, MD, [place] [phone] 12/21/2012

## 2012-12-22 NOTE — Procedures (Signed)
I was present at this dialysis session, have reviewed the session itself and made  appropriate changes   Kelly Splinter MD  pager [phone]    cell [phone]  12/22/2012, 11:26 AM

## 2012-12-22 NOTE — Progress Notes (Addendum)
[facility] PROGRESS NOTE  Tsuneo Faison YDX:[id number] DOB: Dec 20, 1965 DOA: 12/19/2012 PCP: No PCP Per Patient  Interim summary:  Patient is a 47 year old male with past medical history of hypertension and end-stage renal disease on hemodialysis who presented to the emergency room on 11/14 for bleeding from his HD catheter site. His bleeding was treated and he was discharged home. He presented back 48 hours later on 11/16 evening with complaints of fevers, chills and right ears. Initially there was some concern because he had a stiff neck, but this ended up being likely meningismus. Patient was started on broad-spectrum antibiotics. On 11/18 morning, patient's blood cultures grew out positive for gram-positive cocci in clusters.   Assessment/Plan:  1.  MRSA Bacteremia/Sepsis: Likely suspect HD catheter related -clinically improving -Continue vancomycin.  -repeated blood CX from 11/18 pending -2D echo  Pending -for HD cath ligation today/excision for aneurysm and HD cath placement tomorrow  2.  HYPERTENSION: -stable. Continue home meds.  3. End stage renal disease:  -Nephrology following, for HD, For dialysis today  4. GERD (gastroesophageal reflux disease)  - continue PPI  5. Anemia of chronic disease:  - Secondary to renal disease. Stable.  6. COPD -stable  Code Status: Full code  Family Communication: Left messages family   Disposition Plan: Likely will be here for several more days pending further workup   Consultants:  Nephrology  Infectious disease  Procedures:  None  Antibiotics:  Zosyn: One dose on 11/16  Rocephin: 11/16-11/18  Vancomycin 11/16- present  HPI/Subjective: Patient doing okay, no specific complaints  Objective: Filed Vitals:   12/22/12 1222  BP: 168/101  Pulse: 110  Temp: 99 F (37.2 C)  Resp: 18    Intake/Output Summary (Last 24 hours) at 12/22/12 1520 Last data filed at 12/22/12 1447  Gross per 24 hour  Intake     250 ml  Output   1976 ml  Net  -1726 ml   Filed Weights   12/21/12 2208 12/22/12 0656 12/22/12 1136  Weight: 75.6 kg (166 lb 10.7 oz) 77.6 kg (171 lb 1.2 oz) 75.4 kg (166 lb 3.6 oz)    Exam:   General:  Alert and oriented x3, no acute distress  Cardiovascular: Regular rate and rhythm, H2-C9, soft 2/6 systolic ejection murmur  Respiratory: Clear to auscultation bilaterally  Abdomen: Soft, nontender, nondistended, positive bowel sounds  Musculoskeletal: No clubbing or cyanosis, trace pitting edema, left upper extremity fistula nontender with no surrounding erythema. A bandage is on earlier bleeding site  Data Reviewed: Basic Metabolic Panel:  Recent Labs Lab 12/19/12 1700 12/20/12 0430 12/22/12 0659  NA 134* 134* 138  K 5.1 4.7 4.4  CL 90* 92* 96  CO2 24 26 26   GLUCOSE 124* 107* 97  BUN 48* 56* 44*  CREATININE 13.22* 14.73* 13.52*  CALCIUM 10.0 9.5 9.7  PHOS  --   --  4.4   Liver Function Tests:  Recent Labs Lab 12/19/12 1700 12/22/12 0659  AST 12  --   ALT 7  --   ALKPHOS 51  --   BILITOT 0.8  --   PROT 8.1  --   ALBUMIN 3.3* 2.8*   No results found for this basename: LIPASE, AMYLASE,  in the last 168 hours No results found for this basename: AMMONIA,  in the last 168 hours CBC:  Recent Labs Lab 12/19/12 1700 12/20/12 0430 12/21/12 0714 12/22/12 0659  WBC 12.8* 9.0 8.1 8.4  HGB 10.8* 9.2* 8.5* 8.2*  HCT 31.7* 26.9*  25.3* 24.5*  MCV 98.4 97.1 98.1 97.6  PLT 198 179 198 230   Cardiac Enzymes:  Recent Labs Lab 12/21/12 2142  TROPONINI <0.30   BNP (last 3 results) No results found for this basename: PROBNP,  in the last 8760 hours CBG: No results found for this basename: GLUCAP,  in the last 168 hours  Recent Results (from the past 240 hour(s))  CULTURE, BLOOD (ROUTINE X 2)     Status: None   Collection Time    12/19/12 12:50 AM      Result Value Range Status   Specimen Description BLOOD RIGHT ARM   Final   Special Requests BOTTLES  DRAWN AEROBIC AND ANAEROBIC 10CC EACH   Final   Culture  Setup Time     Final   Value: 12/20/2012 08:13     Performed at [facility]   Culture     Final   Value:        BLOOD CULTURE RECEIVED NO GROWTH TO DATE CULTURE WILL BE HELD FOR 5 DAYS BEFORE ISSUING A FINAL NEGATIVE REPORT     Performed at [facility]   Report Status PENDING   Incomplete  CULTURE, BLOOD (ROUTINE X 2)     Status: None   Collection Time    12/19/12  5:00 PM      Result Value Range Status   Specimen Description BLOOD RIGHT FOREARM   Final   Special Requests     Final   Value: BOTTLES DRAWN AEROBIC AND ANAEROBIC 10CC BLUE 5CC RED   Culture  Setup Time     Final   Value: 12/20/2012 04:30     Performed at [facility]   Culture     Final   Value: METHICILLIN RESISTANT STAPHYLOCOCCUS AUREUS     Note: RIFAMPIN AND GENTAMICIN SHOULD NOT BE USED AS SINGLE DRUGS FOR TREATMENT OF STAPH INFECTIONS. CRITICAL RESULT CALLED TO, READ BACK BY AND VERIFIED WITH: ASHLEY TONEY 12/21/12 1356 BY SMITHERJ This organism DOES NOT demonstrate inducible Clindamycin      resistance in vitro.     Note: Gram Stain Report Called to,Read Back By and Verified With: KATHY WICKER ON 12/20/2012 AT 8:50P BY WILEJ     Performed at [facility]   Report Status 12/22/2012 FINAL   Final   Organism ID, Bacteria METHICILLIN RESISTANT STAPHYLOCOCCUS AUREUS   Final  MRSA PCR SCREENING     Status: None   Collection Time    12/20/12  5:46 AM      Result Value Range Status   MRSA by PCR NEGATIVE  NEGATIVE Final   Comment:            The GeneXpert MRSA Assay (FDA     approved for NASAL specimens     only), is one component of a     comprehensive MRSA colonization     surveillance program. It is not     intended to diagnose MRSA     infection nor to guide or     monitor treatment for     MRSA infections.  WOUND CULTURE     Status: None   Collection Time    12/21/12  1:42 PM      Result Value Range Status   Specimen  Description ARM AV FISTULA DRAINAGE L ARM   Final   Special Requests Immunocompromised   Final   Gram Stain     Final   Value: NO WBC SEEN  NO SQUAMOUS EPITHELIAL CELLS SEEN     RARE GRAM POSITIVE COCCI IN PAIRS     Performed at [facility]   Culture     Final   Value: NO GROWTH 1 DAY     Performed at [facility]   Report Status PENDING   Incomplete  CULTURE, BLOOD (ROUTINE X 2)     Status: None   Collection Time    12/21/12  4:04 PM      Result Value Range Status   Specimen Description BLOOD RIGHT ANTECUBITAL   Final   Special Requests BOTTLES DRAWN AEROBIC ONLY 10CC   Final   Culture  Setup Time     Final   Value: 12/21/2012 21:27     Performed at [facility]   Culture     Final   Value:        BLOOD CULTURE RECEIVED NO GROWTH TO DATE CULTURE WILL BE HELD FOR 5 DAYS BEFORE ISSUING A FINAL NEGATIVE REPORT     Performed at [facility]   Report Status PENDING   Incomplete  CULTURE, BLOOD (ROUTINE X 2)     Status: None   Collection Time    12/21/12  4:10 PM      Result Value Range Status   Specimen Description BLOOD RIGHT HAND   Final   Special Requests BOTTLES DRAWN AEROBIC ONLY 8CC   Final   Culture  Setup Time     Final   Value: 12/21/2012 21:31     Performed at [facility]   Culture     Final   Value:        BLOOD CULTURE RECEIVED NO GROWTH TO DATE CULTURE WILL BE HELD FOR 5 DAYS BEFORE ISSUING A FINAL NEGATIVE REPORT     Performed at [facility]   Report Status PENDING   Incomplete  SURGICAL PCR SCREEN     Status: Abnormal   Collection Time    12/22/12 12:03 PM      Result Value Range Status   MRSA, PCR NEGATIVE  NEGATIVE Final   Staphylococcus aureus POSITIVE (*) NEGATIVE Final   Comment:            The Xpert SA Assay (FDA     approved for NASAL specimens     in patients over 20 years of age),     is one component of     a comprehensive surveillance     program.  Test performance has     been validated by  [employer] for patients greater     than or equal to 73 year old.     It is not intended     to diagnose infection nor to     guide or monitor treatment.     Studies: No results found.  Scheduled Meds: . [MAR HOLD] amLODipine  10 mg Oral Daily  . [facility] HOLD] cloNIDine  0.3 mg Transdermal Weekly  . [MAR HOLD] darbepoetin  60 mcg Intravenous Q Wed-HD  . [facility] HOLD] doxercalciferol  6 mcg Intravenous Q M,W,F-HD  . [facility] HOLD] ferric gluconate (FERRLECIT/NULECIT) IV  62.5 mg Intravenous Q Wed-HD  . [MAR HOLD] heparin  5,000 Units Subcutaneous Q8H  . [MAR HOLD] lisinopril  40 mg Oral QHS  . [facility] HOLD] pantoprazole  80 mg Oral Q1200  . [facility] HOLD] sevelamer carbonate  4,000 mg Oral TID WC  . [MAR HOLD] sodium chloride  3  mL Intravenous Q12H  . [facility] HOLD] vancomycin  750 mg Intravenous Q M,W,F-HD   Continuous Infusions:   Principal Problem:   Sepsis Active Problems:   HYPERTENSION   End stage renal disease   GERD (gastroesophageal reflux disease)   Anemia of renal disease    Time spent: 25 minutes    [facility] Pager [phone].. If 7PM-7AM, please contact night-coverage at [email or web address], password [facility] 12/22/2012, 3:20 PM  LOS: 3 days

## 2012-12-22 NOTE — Preoperative (Signed)
Beta Blockers   Reason not to administer Beta Blockers:Not Applicable 

## 2012-12-22 NOTE — Progress Notes (Signed)
[place] for Infectious Disease  Day # 4 vancomycin  Subjective: No new complaints   Antibiotics:  Anti-infectives   Start     Dose/Rate Route Frequency Ordered Stop   12/20/12 2200  cefTRIAXone (ROCEPHIN) 1 g in dextrose 5 % 50 mL IVPB  Status:  Discontinued     1 g 100 mL/hr over 30 Minutes Intravenous Every 24 hours 12/20/12 1420 12/21/12 0757   12/20/12 1200  vancomycin (VANCOCIN) IVPB 750 mg/150 ml premix     750 mg 150 mL/hr over 60 Minutes Intravenous Every M-W-F (Hemodialysis) 12/19/12 2111     12/19/12 2200  cefTRIAXone (ROCEPHIN) 2 g in dextrose 5 % 50 mL IVPB  Status:  Discontinued     2 g 100 mL/hr over 30 Minutes Intravenous Every 12 hours 12/19/12 2046 12/20/12 1420   12/19/12 2115  vancomycin (VANCOCIN) IVPB 750 mg/150 ml premix     750 mg 150 mL/hr over 60 Minutes Intravenous NOW 12/19/12 2111 12/19/12 2237   12/19/12 1645  piperacillin-tazobactam (ZOSYN) IVPB 3.375 g     3.375 g 12.5 mL/hr over 240 Minutes Intravenous  Once 12/19/12 1644 12/19/12 1751   12/19/12 1645  vancomycin (VANCOCIN) IVPB 1000 mg/200 mL premix     1,000 mg 200 mL/hr over 60 Minutes Intravenous  Once 12/19/12 1644 12/19/12 1952      Medications: Scheduled Meds: . amLODipine  10 mg Oral Daily  . [START ON 12/26/2012] cloNIDine  0.3 mg Transdermal Weekly  . darbepoetin  60 mcg Intravenous Q Wed-HD  . doxercalciferol  6 mcg Intravenous Q M,W,F-HD  . ferric gluconate (FERRLECIT/NULECIT) IV  62.5 mg Intravenous Q Wed-HD  . heparin  1,800 Units Dialysis Once in dialysis  . heparin  5,000 Units Subcutaneous Q8H  . lisinopril  40 mg Oral QHS  . pantoprazole  80 mg Oral Q1200  . sevelamer carbonate  4,000 mg Oral TID WC  . sodium chloride  3 mL Intravenous Q12H  . vancomycin  750 mg Intravenous Q M,W,F-HD   Continuous Infusions:  PRN Meds:.sodium chloride, sodium chloride, acetaminophen, acetaminophen, cyclobenzaprine, feeding supplement (NEPRO CARB STEADY), heparin, lidocaine  (PF), lidocaine-prilocaine, morphine injection, pentafluoroprop-tetrafluoroeth   Objective: Weight change: -4 lb 3 oz (-1.9 kg)  Intake/Output Summary (Last 24 hours) at 12/22/12 1056 Last data filed at 12/21/12 1422  Gross per 24 hour  Intake    120 ml  Output      0 ml  Net    120 ml   Blood pressure 156/96, pulse 107, temperature 98.7 F (37.1 C), temperature source Oral, resp. rate 20, height 5' 6"  (1.676 m), weight 171 lb 1.2 oz (77.6 kg), SpO2 92.00%. Temp:  [98 F (36.7 C)-100.1 F (37.8 C)] 98.7 F (37.1 C) (11/19 0656) Pulse Rate:  [81-116] 107 (11/19 1045) Resp:  [16-22] 20 (11/19 1045) BP: (125-174)/(75-102) 156/96 mmHg (11/19 1045) SpO2:  [92 %-100 %] 92 % (11/19 0656) Weight:  [166 lb 10.7 oz (75.6 kg)-171 lb 1.2 oz (77.6 kg)] 171 lb 1.2 oz (77.6 kg) (11/19 6195)  Physical Exam: General: Alert and awake, oriented x3, not in any acute distress getting HD via AVF  HEENT: anicteric sclera, EOMI CVS regular rate, normal r,  no murmur rubs or gallops Chest: clear to auscultation bilaterally, no wheezing, rales or rhonchi Abdomen:nondistended, normal bowel sounds, Extremities: no  clubbing or edema noted bilaterally Neuro: nonfocal  Lab Results:  Recent Labs  12/21/12 0714 12/22/12 0659  WBC 8.1 8.4  HGB 8.5*  8.2*  HCT 25.3* 24.5*  PLT 198 230    BMET  Recent Labs  12/20/12 0430 12/22/12 0659  NA 134* 138  K 4.7 4.4  CL 92* 96  CO2 26 26  GLUCOSE 107* 97  BUN 56* 44*  CREATININE 14.73* 13.52*  CALCIUM 9.5 9.7    Micro Results: Recent Results (from the past 240 hour(s))  CULTURE, BLOOD (ROUTINE X 2)     Status: None   Collection Time    12/19/12 12:50 AM      Result Value Range Status   Specimen Description BLOOD RIGHT ARM   Final   Special Requests BOTTLES DRAWN AEROBIC AND ANAEROBIC 10CC EACH   Final   Culture  Setup Time     Final   Value: 12/20/2012 08:13     Performed at [facility]   Culture     Final   Value:         BLOOD CULTURE RECEIVED NO GROWTH TO DATE CULTURE WILL BE HELD FOR 5 DAYS BEFORE ISSUING A FINAL NEGATIVE REPORT     Performed at [facility]   Report Status PENDING   Incomplete  CULTURE, BLOOD (ROUTINE X 2)     Status: None   Collection Time    12/19/12  5:00 PM      Result Value Range Status   Specimen Description BLOOD RIGHT FOREARM   Final   Special Requests     Final   Value: BOTTLES DRAWN AEROBIC AND ANAEROBIC 10CC BLUE 5CC RED   Culture  Setup Time     Final   Value: 12/20/2012 04:30     Performed at [facility]   Culture     Final   Value: METHICILLIN RESISTANT STAPHYLOCOCCUS AUREUS     Note: RIFAMPIN AND GENTAMICIN SHOULD NOT BE USED AS SINGLE DRUGS FOR TREATMENT OF STAPH INFECTIONS. CRITICAL RESULT CALLED TO, READ BACK BY AND VERIFIED WITH: ASHLEY TONEY 12/21/12 1356 BY SMITHERJ This organism DOES NOT demonstrate inducible Clindamycin      resistance in vitro.     Note: Gram Stain Report Called to,Read Back By and Verified With: KATHY WICKER ON 12/20/2012 AT 8:50P BY WILEJ     Performed at [facility]   Report Status 12/22/2012 FINAL   Final   Organism ID, Bacteria METHICILLIN RESISTANT STAPHYLOCOCCUS AUREUS   Final  MRSA PCR SCREENING     Status: None   Collection Time    12/20/12  5:46 AM      Result Value Range Status   MRSA by PCR NEGATIVE  NEGATIVE Final   Comment:            The GeneXpert MRSA Assay (FDA     approved for NASAL specimens     only), is one component of a     comprehensive MRSA colonization     surveillance program. It is not     intended to diagnose MRSA     infection nor to guide or     monitor treatment for     MRSA infections.  WOUND CULTURE     Status: None   Collection Time    12/21/12  1:42 PM      Result Value Range Status   Specimen Description ARM AV FISTULA DRAINAGE L ARM   Final   Special Requests Immunocompromised   Final   Gram Stain PENDING   Incomplete   Culture     Final   Value: NO  GROWTH 1 DAY      Performed at [facility]   Report Status PENDING   Incomplete  CULTURE, BLOOD (ROUTINE X 2)     Status: None   Collection Time    12/21/12  4:04 PM      Result Value Range Status   Specimen Description BLOOD RIGHT ANTECUBITAL   Final   Special Requests BOTTLES DRAWN AEROBIC ONLY 10CC   Final   Culture  Setup Time     Final   Value: 12/21/2012 21:27     Performed at [facility]   Culture     Final   Value:        BLOOD CULTURE RECEIVED NO GROWTH TO DATE CULTURE WILL BE HELD FOR 5 DAYS BEFORE ISSUING A FINAL NEGATIVE REPORT     Performed at [facility]   Report Status PENDING   Incomplete  CULTURE, BLOOD (ROUTINE X 2)     Status: None   Collection Time    12/21/12  4:10 PM      Result Value Range Status   Specimen Description BLOOD RIGHT HAND   Final   Special Requests BOTTLES DRAWN AEROBIC ONLY 8CC   Final   Culture  Setup Time     Final   Value: 12/21/2012 21:31     Performed at [facility]   Culture     Final   Value:        BLOOD CULTURE RECEIVED NO GROWTH TO DATE CULTURE WILL BE HELD FOR 5 DAYS BEFORE ISSUING A FINAL NEGATIVE REPORT     Performed at [facility]   Report Status PENDING   Incomplete    Studies/Results: No results found.    Assessment/Plan: James Wall is a 47 y.o. male with  47 year old with ESRD via AVF in left arm with recent bleeding from AVF drainage admitted with fevers, rigors and found to have MRSA in the blood.   #1 MRSA bacteremia: likely due to AVF infection   Pt to have a recurrent AV fistula operated on today  --I. would recommend getting intraoperative cultures from the AV fistula   --After resection of his aneurysms and ligation of his fistula, I recommend delaying insertion of a new hemodialysis catheter until we can clear his blood. IF we could even delay to Thursday or Friday new HD that would be beneficial from ID standpoint   --I've ordered repeat blood cultures yesterday and will  order another set tomorrow after he has his AV fistula surgery  --I. Have ordered  a 2-D echocardiogram   --Will also consider getting a transesophageal echocardiogram to evaluate for endocarditis, especially if the 2-D echocardiogram is unrevealing which is likely given his body habitus.   --I would treat him for 6 weeks with IV vancomycin    #2 Screening: check for HIV and Hep A and C   Dr. Johnnye Sima to cover me tomorrow.    LOS: 3 days   Alcide Evener 12/22/2012, 10:56 AM

## 2012-12-22 NOTE — Anesthesia Preprocedure Evaluation (Addendum)
Anesthesia Evaluation  Patient identified by MRN, date of birth, ID band Patient awake    Reviewed: Allergy & Precautions, H&P , NPO status , Patient's Chart, lab work & pertinent test results  Airway Mallampati: II      Dental  (+) Teeth Intact   Pulmonary asthma , sleep apnea , COPDCurrent Smoker,  breath sounds clear to auscultation        Cardiovascular hypertension, Rhythm:Regular Rate:Normal     Neuro/Psych    GI/Hepatic GERD-  Controlled,  Endo/Other    Renal/GU ESRF and DialysisRenal disease     Musculoskeletal   Abdominal   Peds  Hematology   Anesthesia Other Findings   Reproductive/Obstetrics                         Anesthesia Physical Anesthesia Plan  ASA: III  Anesthesia Plan: General   Post-op Pain Management:    Induction: Intravenous  Airway Management Planned: LMA  Additional Equipment:   Intra-op Plan:   Post-operative Plan:   Informed Consent: I have reviewed the patients History and Physical, chart, labs and discussed the procedure including the risks, benefits and alternatives for the proposed anesthesia with the patient or authorized representative who has indicated his/her understanding and acceptance.   Dental advisory given  Plan Discussed with: CRNA, Anesthesiologist and Surgeon  Anesthesia Plan Comments:         Anesthesia Quick Evaluation

## 2012-12-22 NOTE — Op Note (Signed)
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  Assistant: Gerri Lins PA-C  Operative findings: #1 aneurysmal left arm AV fistula ligated with small cuff of vein left on the artery, left cephalic vein excised to the level of the shoulder  Specimens: cultures lumenal surface of aneurysmal fistula  Operative details: After obtaining informed consent, the patient was taken to the operating room. The patient was placed in supine position operating table. After induction of general anesthesia and placement of a laryngeal mask the patient's entire left upper extremity was prepped and draped in usual sterile fashion. A transverse incision was made through a pre-existing scar in the left antecubital region. The incision was carried into the subcutaneous tissues down to level of a pre-existing left brachiocephalic AV fistula. The fistula was dissected free circumferentially all the way down to the level of the brachial artery anastomosis. The brachial artery was dissected free circumferentially proximal and distal to the fistula. The brachial artery was then controlled proximally and distally with fistula clamps. The fistula itself was controlled with a hemostat. The fistula was divided 1 cm above the anastomosis. The cuff of vein was then oversewn to close the artery. At completion this was forebled backbled and thoroughly flushed. Clamps were released and there was a palpable radial and brachial pulse immediately. Attention was then turned toward excising the aneurysmal fistula. Cautery was used to dissect the fistula free circumferentially from the antecubital region all the way to the level of the shoulder. This was done through one longitudinal incision. The fistula was ligated distally with a 2 silk tie. The cephalic vein was  completely removed to the shoulder level. The fistula was approximately 5-7 cm in diameter throughout its course.  After the vein had been removed, hemostasis was obtained. There was a large amount of redundant skin in the upper arm. Therefore a large ellipse of skin was removed so that the redundancy would not leave a floppy portion of skin in the upper arm. The subcutaneous tissues of the antecubital and shoulder incision as well as the incision over the course of the fistula was then reapproximated using a running 3-0 Vicryl suture. The skin of all 3 incisions were closed with 4 0 Vicryl subcuticular stitch. Dermabond was applied all incisions. The patient tolerated procedure well there were no complications. Sponge and needle counts were correct at the end of the case. Patient was taken to the recovery room in stable condition.  The aneurysm was opened longitudinally and cultures were swabbed of the inner surface.  There was no obvious infection or abscess.  Ruta Hinds, MD [facility] Office: [phone] Pager: [phone]

## 2012-12-22 NOTE — Interval H&P Note (Signed)
History and Physical Interval Note:  12/22/2012 12:07 PM  James Wall  has presented today for surgery, with the diagnosis of ESRD;ANEURYSM ON AVF  The various methods of treatment have been discussed with the patient and family. After consideration of risks, benefits and other options for treatment, the patient has consented to  Procedure(s): LIGATION OF ARTERIOVENOUS  FISTULA;EXCISION OF LARGE ANEURYSMS; (Left) as a surgical intervention .  The patient's history has been reviewed, patient examined, no change in status, stable for surgery.  I have reviewed the patient's chart and labs.  Questions were answered to the patient's satisfaction.     [name]

## 2012-12-22 NOTE — Anesthesia Postprocedure Evaluation (Signed)
  Anesthesia Post-op Note  Patient: James Wall  Procedure(s) Performed: Procedure(s): LIGATION OF ARTERIOVENOUS  FISTULA;EXCISION OF LARGE ANEURYSMS; (Left)  Patient Location: PACU  Anesthesia Type:General  Level of Consciousness: awake, alert  and oriented  Airway and Oxygen Therapy: Patient Spontanous Breathing and Patient connected to nasal cannula oxygen  Post-op Pain: mild  Post-op Assessment: Post-op Vital signs reviewed, Patient's Cardiovascular Status Stable, Respiratory Function Stable and Pain level controlled  Post-op Vital Signs: stable  Complications: No apparent anesthesia complications

## 2012-12-22 NOTE — Progress Notes (Signed)
Pt returned from OR. Alert, but orientation questionable. Pt seems scared to speak, but will nod appropriately to answer questions. When I asked pt if the OR meds are making him feel loopy, he nodded. Pt nodded that he has pain in L arm, but will keep from giving more pain meds until mentation improves. MD notified of change in status. She advised that we continue to monitor pt, and hopefully mentation will improve as the sedation continues to clear his system. MD also advised giving Tylenol prn fever.

## 2012-12-22 NOTE — Plan of Care (Signed)
Problem: Phase I Progression Outcomes Goal: Pain controlled with appropriate interventions Outcome: Completed/Met Date Met:  12/22/12 Pt pain/discomfort controlled with flexeril. Declines use of morphine

## 2012-12-22 NOTE — Transfer of Care (Signed)
Immediate Anesthesia Transfer of Care Note  Patient: James Wall  Procedure(s) Performed: Procedure(s): LIGATION OF ARTERIOVENOUS  FISTULA;EXCISION OF LARGE ANEURYSMS; (Left)  Patient Location: PACU  Anesthesia Type:General  Level of Consciousness: awake  Airway & Oxygen Therapy: Patient Spontanous Breathing and Patient connected to face mask oxygen  Post-op Assessment: Report given to PACU RN and Post -op Vital signs reviewed and stable  Post vital signs: Reviewed and stable  Complications: No apparent anesthesia complications

## 2012-12-22 NOTE — Anesthesia Procedure Notes (Signed)
Procedure Name: LMA Insertion Date/Time: 12/22/2012 2:09 PM Performed by: Mariea Clonts Pre-anesthesia Checklist: Patient identified, Patient being monitored, Timeout performed, Emergency Drugs available and Suction available Patient Re-evaluated:Patient Re-evaluated prior to inductionOxygen Delivery Method: Circle system utilized Preoxygenation: Pre-oxygenation with 100% oxygen Intubation Type: IV induction LMA: LMA inserted LMA Size: 4.0 Number of attempts: 1 Placement Confirmation: breath sounds checked- equal and bilateral and positive ETCO2 Tube secured with: Tape Dental Injury: Teeth and Oropharynx as per pre-operative assessment

## 2012-12-22 NOTE — Progress Notes (Signed)
   [facility] Progress Note    Subjective: Seen by VVS and ID yest, plan for AVF ligation/resection after HD today   Exam  Blood pressure 165/95, pulse 107, temperature 98.7 F (37.1 C), temperature source Oral, resp. rate 18, height 5' 6"  (1.676 m), weight 77.6 kg (171 lb 1.2 oz), SpO2 92.00%. Gen: Alert, cooperative, NAD  Heart: RRR  Lungs: dec'd at bases  Abd: soft, NT, non-distended, + BS  Ext: no LE edema  Access: large aneurysmal L AVF, + bruit, blister on aneurysm draining more pus today  Dialysis orders: MWF at [facility]  4hrs 78kg 2K/2Ca 450/A1.5 Heparin 1800 LUA AVF  Hectorol 6ug Epo 1800 Venofer 50/wk  Assessment/Plan:  1. Fever / MRSA bacteremia / prob infected AVF: to OR after HD today per VVS, on abx per ID 2. ESRD: HD today 3. Anemia - Hgb low, on aranesp 60/wk, cont maint IV fe 4. Secondary hyperparathyroidism - Ca 9.5 (10 corrected). PTH in range. Continue Renvela, Vit D, low Ca bath. 5. HTN/volume - cont meds, below dry wt but BP high, UF 2-3 kg today 6. Nutrition - Alb 3.3. Renal diet    Kelly Splinter MD  pager [phone]    cell [phone]  12/22/2012, 11:28 AM   Recent Labs Lab 12/19/12 1700 12/20/12 0430 12/22/12 0659  NA 134* 134* 138  K 5.1 4.7 4.4  CL 90* 92* 96  CO2 24 26 26   GLUCOSE 124* 107* 97  BUN 48* 56* 44*  CREATININE 13.22* 14.73* 13.52*  CALCIUM 10.0 9.5 9.7  PHOS  --   --  4.4    Recent Labs Lab 12/19/12 1700 12/22/12 0659  AST 12  --   ALT 7  --   ALKPHOS 51  --   BILITOT 0.8  --   PROT 8.1  --   ALBUMIN 3.3* 2.8*    Recent Labs Lab 12/20/12 0430 12/21/12 0714 12/22/12 0659  WBC 9.0 8.1 8.4  HGB 9.2* 8.5* 8.2*  HCT 26.9* 25.3* 24.5*  MCV 97.1 98.1 97.6  PLT 179 198 230   . amLODipine  10 mg Oral Daily  . [START ON 12/26/2012] cloNIDine  0.3 mg Transdermal Weekly  . darbepoetin  60 mcg Intravenous Q Wed-HD  . doxercalciferol  6 mcg Intravenous Q M,W,F-HD  . ferric gluconate (FERRLECIT/NULECIT) IV   62.5 mg Intravenous Q Wed-HD  . heparin  1,800 Units Dialysis Once in dialysis  . heparin  5,000 Units Subcutaneous Q8H  . lisinopril  40 mg Oral QHS  . pantoprazole  80 mg Oral Q1200  . sevelamer carbonate  4,000 mg Oral TID WC  . sodium chloride  3 mL Intravenous Q12H  . vancomycin  750 mg Intravenous Q M,W,F-HD     sodium chloride, sodium chloride, acetaminophen, acetaminophen, cyclobenzaprine, feeding supplement (NEPRO CARB STEADY), heparin, lidocaine (PF), lidocaine-prilocaine, morphine injection, pentafluoroprop-tetrafluoroeth

## 2012-12-23 ENCOUNTER — Encounter (HOSPITAL_COMMUNITY): Payer: Self-pay | Admitting: Anesthesiology

## 2012-12-23 ENCOUNTER — Inpatient Hospital Stay (HOSPITAL_COMMUNITY): Payer: Medicare Other | Admitting: Anesthesiology

## 2012-12-23 ENCOUNTER — Inpatient Hospital Stay (HOSPITAL_COMMUNITY): Payer: Medicare Other

## 2012-12-23 ENCOUNTER — Encounter (HOSPITAL_COMMUNITY): Payer: Medicare Other | Admitting: Anesthesiology

## 2012-12-23 ENCOUNTER — Encounter (HOSPITAL_COMMUNITY)
Admission: EM | Disposition: A | Discharge status: Home / Self Care | Payer: Self-pay | Source: Home / Self Care | Attending: Internal Medicine

## 2012-12-23 DIAGNOSIS — I517 Cardiomegaly: Secondary | ICD-10-CM

## 2012-12-23 DIAGNOSIS — T827XXA Infection and inflammatory reaction due to other cardiac and vascular devices, implants and grafts, initial encounter: Secondary | ICD-10-CM

## 2012-12-23 DIAGNOSIS — A4901 Methicillin susceptible Staphylococcus aureus infection, unspecified site: Secondary | ICD-10-CM

## 2012-12-23 HISTORY — PX: INSERTION OF DIALYSIS CATHETER: SHX1324

## 2012-12-23 LAB — CBC
MCHC: 33.2 g/dL (ref 30.0–36.0)
RDW: 13 % (ref 11.5–15.5)
WBC: 9.4 10*3/uL (ref 4.0–10.5)

## 2012-12-23 LAB — BASIC METABOLIC PANEL
Calcium: 9.6 mg/dL (ref 8.4–10.5)
GFR calc Af Amer: 8 mL/min — ABNORMAL LOW (ref >90)
GFR calc non Af Amer: 7 mL/min — ABNORMAL LOW (ref >90)
Potassium: 4.2 mEq/L (ref 3.5–5.1)
Sodium: 136 mEq/L (ref 135–145)

## 2012-12-23 LAB — HEPATITIS B SURFACE ANTIGEN: Hepatitis B Surface Ag: NEGATIVE

## 2012-12-23 SURGERY — INSERTION OF DIALYSIS CATHETER
Anesthesia: General | Wound class: Clean

## 2012-12-23 MED ORDER — 0.9 % SODIUM CHLORIDE (POUR BTL) OPTIME
TOPICAL | Status: DC | PRN
  Administered 2012-12-23: 1000 mL

## 2012-12-23 MED ORDER — OXYCODONE-ACETAMINOPHEN 5-325 MG PO TABS
1.0000 | ORAL_TABLET | ORAL | Status: DC | PRN
Start: 2012-12-23 — End: 2012-12-24
  Administered 2012-12-23: 2 via ORAL
  Administered 2012-12-23: 1 via ORAL
  Filled 2012-12-23: qty 2

## 2012-12-23 MED ORDER — DARBEPOETIN ALFA-POLYSORBATE 25 MCG/0.42ML IJ SOLN: 60.0000 ug | INTRAMUSCULAR | Status: DC

## 2012-12-23 MED ORDER — SODIUM CHLORIDE 0.9 % IR SOLN
Status: DC | PRN
  Administered 2012-12-23: 11:00:00

## 2012-12-23 MED ORDER — SODIUM CHLORIDE 0.9 % IV SOLN
INTRAVENOUS | Status: DC | PRN
  Administered 2012-12-23: 10:00:00 via INTRAVENOUS

## 2012-12-23 MED ORDER — LIDOCAINE HCL (CARDIAC) 20 MG/ML IV SOLN
INTRAVENOUS | Status: DC | PRN
  Administered 2012-12-23: 80 mg via INTRAVENOUS

## 2012-12-23 MED ORDER — ARTIFICIAL TEARS OP OINT
TOPICAL_OINTMENT | OPHTHALMIC | Status: DC | PRN
  Administered 2012-12-23: 1 via OPHTHALMIC

## 2012-12-23 MED ORDER — HYDROCODONE-ACETAMINOPHEN 5-325 MG PO TABS
1.0000 | ORAL_TABLET | ORAL | Status: DC | PRN
  Administered 2012-12-23 – 2012-12-24 (×3): 1 via ORAL
  Filled 2012-12-23 (×3): qty 1

## 2012-12-23 MED ORDER — SODIUM CHLORIDE 0.9 % IV SOLN: 62.5000 mg | INTRAVENOUS | Status: DC

## 2012-12-23 MED ORDER — HYDROMORPHONE HCL PF 1 MG/ML IJ SOLN
INTRAMUSCULAR | Status: AC
  Filled 2012-12-23: qty 1

## 2012-12-23 MED ORDER — HEPARIN SODIUM (PORCINE) 1000 UNIT/ML IJ SOLN
INTRAMUSCULAR | Status: DC | PRN
  Administered 2012-12-23: 10 mL

## 2012-12-23 MED ORDER — MIDAZOLAM HCL 5 MG/5ML IJ SOLN
INTRAMUSCULAR | Status: DC | PRN
  Administered 2012-12-23: 2 mg via INTRAVENOUS

## 2012-12-23 MED ORDER — ONDANSETRON HCL 4 MG/2ML IJ SOLN
INTRAMUSCULAR | Status: DC | PRN
  Administered 2012-12-23: 4 mg via INTRAVENOUS

## 2012-12-23 MED ORDER — PHENYLEPHRINE HCL 10 MG/ML IJ SOLN
INTRAMUSCULAR | Status: DC | PRN
  Administered 2012-12-23 (×2): 80 ug via INTRAVENOUS
  Administered 2012-12-23: 120 ug via INTRAVENOUS
  Administered 2012-12-23: 80 ug via INTRAVENOUS

## 2012-12-23 MED ORDER — HEPARIN SODIUM (PORCINE) 1000 UNIT/ML IJ SOLN
INTRAMUSCULAR | Status: AC
  Filled 2012-12-23: qty 1

## 2012-12-23 MED ORDER — LIDOCAINE-EPINEPHRINE (PF) 1 %-1:200000 IJ SOLN
INTRAMUSCULAR | Status: AC
  Filled 2012-12-23: qty 10

## 2012-12-23 MED ORDER — POLYETHYLENE GLYCOL 3350 17 G PO PACK
17.0000 g | PACK | Freq: Every day | ORAL | Status: DC
  Administered 2012-12-23: 17 g via ORAL
  Filled 2012-12-23 (×2): qty 1

## 2012-12-23 MED ORDER — FENTANYL CITRATE 0.05 MG/ML IJ SOLN
INTRAMUSCULAR | Status: DC | PRN
  Administered 2012-12-23: 75 ug via INTRAVENOUS
  Administered 2012-12-23 (×3): 50 ug via INTRAVENOUS
  Administered 2012-12-23: 25 ug via INTRAVENOUS
  Administered 2012-12-23: 50 ug via INTRAVENOUS

## 2012-12-23 MED ORDER — ONDANSETRON HCL 4 MG/2ML IJ SOLN: 4.0000 mg | Freq: Once | INTRAMUSCULAR | Status: DC | PRN

## 2012-12-23 MED ORDER — PROPOFOL 10 MG/ML IV BOLUS
INTRAVENOUS | Status: DC | PRN
  Administered 2012-12-23: 200 mg via INTRAVENOUS

## 2012-12-23 MED ORDER — HYDROMORPHONE HCL PF 1 MG/ML IJ SOLN
0.2500 mg | INTRAMUSCULAR | Status: DC | PRN
  Administered 2012-12-23 (×4): 0.25 mg via INTRAVENOUS

## 2012-12-23 MED ORDER — ESMOLOL HCL 10 MG/ML IV SOLN
INTRAVENOUS | Status: DC | PRN
  Administered 2012-12-23 (×2): 15 mg via INTRAVENOUS

## 2012-12-23 MED ORDER — OXYCODONE-ACETAMINOPHEN 5-325 MG PO TABS
ORAL_TABLET | ORAL | Status: AC
  Filled 2012-12-23: qty 1

## 2012-12-23 SURGICAL SUPPLY — 53 items
ADH SKN CLS APL DERMABOND .7 (GAUZE/BANDAGES/DRESSINGS) ×1
BAG BANDED W/RUBBER/TAPE 36X54 (MISCELLANEOUS) ×1 IMPLANT
BAG DECANTER FOR FLEXI CONT (MISCELLANEOUS) ×2 IMPLANT
BAG EQP BAND 135X91 W/RBR TAPE (MISCELLANEOUS) ×1
CATH CANNON HEMO 15F 50CM (CATHETERS) IMPLANT
CATH CANNON HEMO 15FR 19 (HEMODIALYSIS SUPPLIES) IMPLANT
CATH CANNON HEMO 15FR 23CM (HEMODIALYSIS SUPPLIES) IMPLANT
CATH CANNON HEMO 15FR 31CM (HEMODIALYSIS SUPPLIES) IMPLANT
CATH CANNON HEMO 15FR 32 (HEMODIALYSIS SUPPLIES) IMPLANT
CATH CANNON HEMO 15FR 32CM (HEMODIALYSIS SUPPLIES) ×2 IMPLANT
CATH STRAIGHT 5FR 65CM (CATHETERS) ×1 IMPLANT
CHLORAPREP W/TINT 26ML (MISCELLANEOUS) ×2 IMPLANT
COVER DOME SNAP 22 D (MISCELLANEOUS) ×1 IMPLANT
COVER PROBE W GEL 5X96 (DRAPES) ×1 IMPLANT
COVER SURGICAL LIGHT HANDLE (MISCELLANEOUS) ×2 IMPLANT
DERMABOND ADVANCED (GAUZE/BANDAGES/DRESSINGS) ×1
DERMABOND ADVANCED .7 DNX12 (GAUZE/BANDAGES/DRESSINGS) IMPLANT
DEVICE TORQUE H2O (MISCELLANEOUS) ×1 IMPLANT
DRAPE C-ARM 42X72 X-RAY (DRAPES) ×1 IMPLANT
DRAPE CHEST BREAST 15X10 FENES (DRAPES) ×2 IMPLANT
GAUZE SPONGE 2X2 8PLY STRL LF (GAUZE/BANDAGES/DRESSINGS) ×1 IMPLANT
GAUZE SPONGE 4X4 16PLY XRAY LF (GAUZE/BANDAGES/DRESSINGS) ×2 IMPLANT
GLOVE BIO SURGEON STRL SZ 6.5 (GLOVE) ×1 IMPLANT
GLOVE BIO SURGEON STRL SZ7.5 (GLOVE) ×2 IMPLANT
GLOVE BIOGEL PI IND STRL 8 (GLOVE) ×1 IMPLANT
GLOVE BIOGEL PI INDICATOR 8 (GLOVE) ×1
GLOVE SURG SS PI 7.0 STRL IVOR (GLOVE) ×1 IMPLANT
GOWN STRL NON-REIN LRG LVL3 (GOWN DISPOSABLE) ×4 IMPLANT
GOWN STRL REIN XL XLG (GOWN DISPOSABLE) ×1 IMPLANT
GUIDEWIRE ANGLED .035X150CM (WIRE) ×1 IMPLANT
KIT BASIN OR (CUSTOM PROCEDURE TRAY) ×2 IMPLANT
KIT ROOM TURNOVER OR (KITS) ×2 IMPLANT
NDL 18GX1X1/2 (RX/OR ONLY) (NEEDLE) ×1 IMPLANT
NDL HYPO 25GX1X1/2 BEV (NEEDLE) ×1 IMPLANT
NEEDLE 18GX1X1/2 (RX/OR ONLY) (NEEDLE) ×2 IMPLANT
NEEDLE 22X1 1/2 (OR ONLY) (NEEDLE) ×2 IMPLANT
NEEDLE HYPO 25GX1X1/2 BEV (NEEDLE) ×2 IMPLANT
NS IRRIG 1000ML POUR BTL (IV SOLUTION) ×2 IMPLANT
PACK SURGICAL SETUP 50X90 (CUSTOM PROCEDURE TRAY) ×2 IMPLANT
PAD ARMBOARD 7.5X6 YLW CONV (MISCELLANEOUS) ×4 IMPLANT
SPONGE GAUZE 2X2 STER 10/PKG (GAUZE/BANDAGES/DRESSINGS) ×1
SUT ETHILON 3 0 PS 1 (SUTURE) ×2 IMPLANT
SUT VICRYL 4-0 PS2 18IN ABS (SUTURE) ×2 IMPLANT
SYR 20CC LL (SYRINGE) ×4 IMPLANT
SYR 30ML LL (SYRINGE) IMPLANT
SYR 5ML LL (SYRINGE) ×4 IMPLANT
SYR CONTROL 10ML LL (SYRINGE) ×2 IMPLANT
SYRINGE 10CC LL (SYRINGE) ×2 IMPLANT
TAPE CLOTH SURG 4X10 WHT LF (GAUZE/BANDAGES/DRESSINGS) ×1 IMPLANT
TOWEL OR 17X24 6PK STRL BLUE (TOWEL DISPOSABLE) ×2 IMPLANT
TOWEL OR 17X26 10 PK STRL BLUE (TOWEL DISPOSABLE) ×2 IMPLANT
WATER STERILE IRR 1000ML POUR (IV SOLUTION) ×1 IMPLANT
WIRE AMPLATZ SS-J .035X180CM (WIRE) ×1 IMPLANT

## 2012-12-23 NOTE — Progress Notes (Signed)
INFECTIOUS DISEASE PROGRESS NOTE  ID: James Wall is a 47 y.o. male with  Principal Problem:   Sepsis Active Problems:   HYPERTENSION   End stage renal disease   GERD (gastroesophageal reflux disease)   Anemia of renal disease  Subjective: Without complaints  Abtx:  Anti-infectives   Start     Dose/Rate Route Frequency Ordered Stop   12/20/12 2200  cefTRIAXone (ROCEPHIN) 1 g in dextrose 5 % 50 mL IVPB  Status:  Discontinued     1 g 100 mL/hr over 30 Minutes Intravenous Every 24 hours 12/20/12 1420 12/21/12 0757   12/20/12 1200  vancomycin (VANCOCIN) IVPB 750 mg/150 ml premix     750 mg 150 mL/hr over 60 Minutes Intravenous Every M-W-F (Hemodialysis) 12/19/12 2111     12/19/12 2200  cefTRIAXone (ROCEPHIN) 2 g in dextrose 5 % 50 mL IVPB  Status:  Discontinued     2 g 100 mL/hr over 30 Minutes Intravenous Every 12 hours 12/19/12 2046 12/20/12 1420   12/19/12 2115  vancomycin (VANCOCIN) IVPB 750 mg/150 ml premix     750 mg 150 mL/hr over 60 Minutes Intravenous NOW 12/19/12 2111 12/19/12 2237   12/19/12 1645  piperacillin-tazobactam (ZOSYN) IVPB 3.375 g     3.375 g 12.5 mL/hr over 240 Minutes Intravenous  Once 12/19/12 1644 12/19/12 1751   12/19/12 1645  vancomycin (VANCOCIN) IVPB 1000 mg/200 mL premix     1,000 mg 200 mL/hr over 60 Minutes Intravenous  Once 12/19/12 1644 12/19/12 1952      Medications:  Scheduled: . amLODipine  10 mg Oral Daily  . [START ON 12/26/2012] cloNIDine  0.3 mg Transdermal Weekly  . darbepoetin  60 mcg Intravenous Q Wed-HD  . doxercalciferol  6 mcg Intravenous Q M,W,F-HD  . ferric gluconate (FERRLECIT/NULECIT) IV  62.5 mg Intravenous Q Wed-HD  . heparin  5,000 Units Subcutaneous Q8H  . HYDROmorphone      . lisinopril  40 mg Oral QHS  . oxyCODONE-acetaminophen      . pantoprazole  80 mg Oral Q1200  . polyethylene glycol  17 g Oral Daily  . sevelamer carbonate  4,000 mg Oral TID WC  . sodium chloride  3 mL Intravenous Q12H  .  vancomycin  750 mg Intravenous Q M,W,F-HD    Objective: Vital signs in last 24 hours: Temp:  [97 F (36.1 C)-99.5 F (37.5 C)] 97 F (36.1 C) (11/20 1304) Pulse Rate:  [85-114] 85 (11/20 1304) Resp:  [10-29] 10 (11/20 1304) BP: (123-171)/(67-105) 141/90 mmHg (11/20 1307) SpO2:  [92 %-100 %] 100 % (11/20 1304)   General appearance: alert, cooperative and no distress Resp: clear to auscultation bilaterally Chest wall: HD catheter L chest, mild tenderness Cardio: regular rate and rhythm GI: normal findings: bowel sounds normal and soft, non-tender Extremities: edema none and LUE wrapped.  Lab Results  Recent Labs  12/22/12 0659 12/23/12 0525  WBC 8.4 9.4  HGB 8.2* 8.9*  HCT 24.5* 26.8*  NA 138 136  K 4.4 4.2  CL 96 94*  CO2 26 28  BUN 44* 24*  CREATININE 13.52* 8.50*   Liver Panel  Recent Labs  12/22/12 0659  ALBUMIN 2.8*   Sedimentation Rate No results found for this basename: ESRSEDRATE,  in the last 72 hours C-Reactive Protein No results found for this basename: CRP,  in the last 72 hours  Microbiology: Recent Results (from the past 240 hour(s))  CULTURE, BLOOD (ROUTINE X 2)     Status:  None   Collection Time    12/19/12 12:50 AM      Result Value Range Status   Specimen Description BLOOD RIGHT ARM   Final   Special Requests BOTTLES DRAWN AEROBIC AND ANAEROBIC 10CC EACH   Final   Culture  Setup Time     Final   Value: 12/20/2012 08:13     Performed at [facility]   Culture     Final   Value:        BLOOD CULTURE RECEIVED NO GROWTH TO DATE CULTURE WILL BE HELD FOR 5 DAYS BEFORE ISSUING A FINAL NEGATIVE REPORT     Performed at [facility]   Report Status PENDING   Incomplete  CULTURE, BLOOD (ROUTINE X 2)     Status: None   Collection Time    12/19/12  5:00 PM      Result Value Range Status   Specimen Description BLOOD RIGHT FOREARM   Final   Special Requests     Final   Value: BOTTLES DRAWN AEROBIC AND ANAEROBIC 10CC BLUE 5CC RED     Culture  Setup Time     Final   Value: 12/20/2012 04:30     Performed at [facility]   Culture     Final   Value: METHICILLIN RESISTANT STAPHYLOCOCCUS AUREUS     Note: RIFAMPIN AND GENTAMICIN SHOULD NOT BE USED AS SINGLE DRUGS FOR TREATMENT OF STAPH INFECTIONS. CRITICAL RESULT CALLED TO, READ BACK BY AND VERIFIED WITH: ASHLEY TONEY 12/21/12 1356 BY SMITHERJ This organism DOES NOT demonstrate inducible Clindamycin      resistance in vitro.     Note: Gram Stain Report Called to,Read Back By and Verified With: KATHY WICKER ON 12/20/2012 AT 8:50P BY WILEJ     Performed at [facility]   Report Status 12/22/2012 FINAL   Final   Organism ID, Bacteria METHICILLIN RESISTANT STAPHYLOCOCCUS AUREUS   Final  MRSA PCR SCREENING     Status: None   Collection Time    12/20/12  5:46 AM      Result Value Range Status   MRSA by PCR NEGATIVE  NEGATIVE Final   Comment:            The GeneXpert MRSA Assay (FDA     approved for NASAL specimens     only), is one component of a     comprehensive MRSA colonization     surveillance program. It is not     intended to diagnose MRSA     infection nor to guide or     monitor treatment for     MRSA infections.  WOUND CULTURE     Status: None   Collection Time    12/21/12  1:42 PM      Result Value Range Status   Specimen Description ARM AV FISTULA DRAINAGE L ARM   Final   Special Requests Immunocompromised   Final   Gram Stain     Final   Value: NO WBC SEEN     NO SQUAMOUS EPITHELIAL CELLS SEEN     RARE GRAM POSITIVE COCCI IN PAIRS     Performed at [facility]   Culture     Final   Value: MODERATE STAPHYLOCOCCUS AUREUS     Note: RIFAMPIN AND GENTAMICIN SHOULD NOT BE USED AS SINGLE DRUGS FOR TREATMENT OF STAPH INFECTIONS.     Performed at [facility]   Report Status PENDING   Incomplete  CULTURE, BLOOD (ROUTINE X 2)     Status: None   Collection Time    12/21/12  4:04 PM      Result Value Range Status   Specimen  Description BLOOD RIGHT ANTECUBITAL   Final   Special Requests BOTTLES DRAWN AEROBIC ONLY 10CC   Final   Culture  Setup Time     Final   Value: 12/21/2012 21:27     Performed at [facility]   Culture     Final   Value:        BLOOD CULTURE RECEIVED NO GROWTH TO DATE CULTURE WILL BE HELD FOR 5 DAYS BEFORE ISSUING A FINAL NEGATIVE REPORT     Performed at [facility]   Report Status PENDING   Incomplete  CULTURE, BLOOD (ROUTINE X 2)     Status: None   Collection Time    12/21/12  4:10 PM      Result Value Range Status   Specimen Description BLOOD RIGHT HAND   Final   Special Requests BOTTLES DRAWN AEROBIC ONLY 8CC   Final   Culture  Setup Time     Final   Value: 12/21/2012 21:31     Performed at [facility]   Culture     Final   Value:        BLOOD CULTURE RECEIVED NO GROWTH TO DATE CULTURE WILL BE HELD FOR 5 DAYS BEFORE ISSUING A FINAL NEGATIVE REPORT     Performed at [facility]   Report Status PENDING   Incomplete  SURGICAL PCR SCREEN     Status: Abnormal   Collection Time    12/22/12 12:03 PM      Result Value Range Status   MRSA, PCR NEGATIVE  NEGATIVE Final   Staphylococcus aureus POSITIVE (*) NEGATIVE Final   Comment:            The Xpert SA Assay (FDA     approved for NASAL specimens     in patients over 85 years of age),     is one component of     a comprehensive surveillance     program.  Test performance has     been validated by [employer] for patients greater     than or equal to 39 year old.     It is not intended     to diagnose infection nor to     guide or monitor treatment.  WOUND CULTURE     Status: None   Collection Time    12/22/12  3:26 PM      Result Value Range Status   Specimen Description WOUND ARM LEFT   Final   Special Requests LEFT FISTULA PT ON VANCOMYCIN   Final   Gram Stain     Final   Value: RARE WBC PRESENT,BOTH PMN AND MONONUCLEAR     NO SQUAMOUS EPITHELIAL CELLS SEEN     NO ORGANISMS SEEN      Performed at [facility]   Culture     Final   Value: NO GROWTH     Performed at [facility]   Report Status PENDING   Incomplete    Studies/Results: Dg Chest Port 1 View  12/23/2012   CLINICAL DATA:  New dialysis catheter.  EXAM: PORTABLE CHEST - 1 VIEW  COMPARISON:  PA and lateral chest 12/19/2012.  FINDINGS: The patient has a new left IJ approach double lumen central venous  catheter. Tip of the distal lumen projects over the right atrium with the proximal in projecting over the superior cavoatrial junction. There is cardiomegaly and mild interstitial edema. No pneumothorax or pleural fluid.  IMPRESSION: New dialysis catheter projects in good position.  No pneumothorax.  Cardiomegaly and mild interstitial edema.   Electronically Signed   By: Inge Rise M.D.   On: 12/23/2012 12:27     Assessment/Plan: Infected AV  MRSA bacteremia ESRD  AVG resected 11-19 continue vancomycin HD line has been replaced 11-20.  BCx 11-18 ngtd, Cx from OR pending.   Total days of antibiotics: 5 vanco         Bobby Rumpf Infectious Diseases (pager) [phone] [email or web address] 12/23/2012, 2:39 PM  LOS: 4 days

## 2012-12-23 NOTE — Op Note (Signed)
   NAME: James Wall   MRN: [id number] DOB: 07/15/1965    DATE OF OPERATION: 12/23/2012  PREOP DIAGNOSIS: chronic kidney disease  POSTOP DIAGNOSIS: same  PROCEDURE: ultrasound-guided placement of 27 cm left IJ tunneled dialysis catheter  SURGEON: Judeth Cornfield. Scot Dock, MD, FACS  ASSIST: none  ANESTHESIA: general   EBL: minimal  INDICATIONS: James Wall is a 47 y.o. male who had a large aneurysmal AV fistula excised yesterday. He had a positive blood culture and therefore delayed placement of the catheter for an additional 24 hours.  FINDINGS: the right IJ appeared patent although a wire would not pass. The left IJ was patent.   TECHNIQUE:  The patient was taken to the operating room and received a general anesthetic. The neck and upper chest were prepped and draped in usual sterile fashion. Under ultrasound guidance, the right IJ was cannulated without difficulty. However, the J-wire would not pass. Additionally tried an angled Glidewire and this would not pass either. 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 I therefore elected to proceed on the left side. Under ultrasound guidance, the left IJ was cannulated. A guidewire was then manipulated down into the right atrium. Tract over the wire was dilated. The dilator and peel-away sheath would not pass easily over the J-wire. I therefore exchanged the J-wire for an Amplatz wire over an endhole catheter. The dilator and peel-away sheath were then advanced over the Amplatz wire. The dilator was removed. The 27 cm Diatek catheter was threaded over the wire through the peel-away sheath down into the right atrium. The wire and peel-away sheath were then removed. The exit site of the cath was selected and the cath was brought to the tunnel. The catheter was cut to the appropriate length and the distal ports were attached. Both ports withdrew easily and flushed with heparinized saline filled with  concentrated heparin. Catheter was secured at its exit site with a 3-0 nylon suture. The IJ cannulation site was closed with a 4-0 subcuticular stitch. Dermabond was applied. He tolerated the procedure well and was transferred to the recovery room in stable condition. All needle and sponge counts were correct. Deitra Mayo, MD, FACS [facility]  DATE OF DICTATION:   12/23/2012

## 2012-12-23 NOTE — Anesthesia Postprocedure Evaluation (Signed)
  Anesthesia Post-op Note  Patient: James Wall  Procedure(s) Performed: Procedure(s): INSERTION OF DIALYSIS CATHETER; ULTRASOUND GUIDED (N/A)  Patient Location: PACU  Anesthesia Type:General  Level of Consciousness: awake, oriented, sedated and patient cooperative  Airway and Oxygen Therapy: Patient Spontanous Breathing  Post-op Pain: none  Post-op Assessment: Post-op Vital signs reviewed, Patient's Cardiovascular Status Stable, Respiratory Function Stable, Patent Airway, No signs of Nausea or vomiting and Pain level controlled  Post-op Vital Signs: stable  Complications: No apparent anesthesia complications

## 2012-12-23 NOTE — Progress Notes (Signed)
[facility] Progress Note  Subjective:   Just back from OR s/p insertion of HD catheter. Hungry, but no other complaints. Afebrile  Objective Filed Vitals:   12/23/12 1254 12/23/12 1300 12/23/12 1304 12/23/12 1307  BP: 141/94   141/90  Pulse:  86 85   Temp:   97 F (36.1 C)   TempSrc:      Resp:  24 10   Height:      Weight:      SpO2:  100% 100%    Physical Exam General: Groggy, cooperative, NAD Heart: RRR, no murmur or rub noted Lungs: Mostly clear bilat, poor effort. Abdomen: soft, NT, + BS Extremities: No LE edema Dialysis Access: New L TDC,  L AVF ligated/ ace wrapped  Dialysis orders: MWF at [facility]  4hrs 78kg 2K/2Ca 450/A1.5 Heparin 1800 LUA AVF  Hectorol 6ug Epo 1800 Venofer 50/wk  Assessment/Plan: 1. Fever / MRSA bacteremia - Likely due to infected HD access. Abx per ID 2. HD Access - Infected aneurysmal L AVF ligated per Dr. [name] on 11/19.  Rt I-J TDC placed today 3. ESRD: HD tomorrow, no heparin 4. Anemia - Hgb 8.9 > 8.2. Cont aranesp 60/wk and maint IV fe. Follow CBC.  5. Secondary hyperparathyroidism - Ca 9.6 (10.6 corrected). PTH in range. Continue Renvela, Vit D, low Ca bath. 6. HTN/volume - SBPs 140s. Cont meds. Below dry wt. UF 1-2 L tomorrow as tolerated 7. Nutrition - Alb 2.8. Renal diet, multivitamin  Collene Leyden. Cletus Gash, PA-C [facility] Pager [phone] 12/23/2012,1:35 PM  LOS: 4 days   I have seen and examined patient, discussed with PA and agree with assessment and plan as outlined above. Kelly Splinter MD pager [phone]    cell [phone] 12/23/2012, 5:00 PM    Additional Objective Labs: Basic Metabolic Panel:  Recent Labs Lab 12/20/12 0430 12/22/12 0659 12/23/12 0525  NA 134* 138 136  K 4.7 4.4 4.2  CL 92* 96 94*  CO2 26 26 28   GLUCOSE 107* 97 77  BUN 56* 44* 24*  CREATININE 14.73* 13.52* 8.50*  CALCIUM 9.5 9.7 9.6  PHOS  --  4.4  --    Liver Function Tests:  Recent Labs Lab 12/19/12 1700  12/22/12 0659  AST 12  --   ALT 7  --   ALKPHOS 51  --   BILITOT 0.8  --   PROT 8.1  --   ALBUMIN 3.3* 2.8*   No results found for this basename: LIPASE, AMYLASE,  in the last 168 hours CBC:  Recent Labs Lab 12/19/12 1700 12/20/12 0430 12/21/12 0714 12/22/12 0659 12/23/12 0525  WBC 12.8* 9.0 8.1 8.4 9.4  HGB 10.8* 9.2* 8.5* 8.2* 8.9*  HCT 31.7* 26.9* 25.3* 24.5* 26.8*  MCV 98.4 97.1 98.1 97.6 98.5  PLT 198 179 198 230 262   Blood Culture    Component Value Date/Time   SDES WOUND ARM LEFT 12/22/2012 1526   SPECREQUEST LEFT FISTULA PT ON VANCOMYCIN 12/22/2012 1526   CULT  Value: NO GROWTH Performed at [facility] 12/22/2012 1526   REPTSTATUS PENDING 12/22/2012 1526    Cardiac Enzymes:  Recent Labs Lab 12/21/12 2142  TROPONINI <0.30   CBG:  Recent Labs Lab 12/22/12 1735  GLUCAP 82    Studies/Results: Dg Chest Port 1 View  12/23/2012   CLINICAL DATA:  New dialysis catheter.  EXAM: PORTABLE CHEST - 1 VIEW  COMPARISON:  PA and lateral chest 12/19/2012.  FINDINGS: The patient has a new  left IJ approach double lumen central venous catheter. Tip of the distal lumen projects over the right atrium with the proximal in projecting over the superior cavoatrial junction. There is cardiomegaly and mild interstitial edema. No pneumothorax or pleural fluid.  IMPRESSION: New dialysis catheter projects in good position.  No pneumothorax.  Cardiomegaly and mild interstitial edema.   Electronically Signed   By: Inge Rise M.D.   On: 12/23/2012 12:27   Medications:   . amLODipine  10 mg Oral Daily  . [START ON 12/26/2012] cloNIDine  0.3 mg Transdermal Weekly  . darbepoetin  60 mcg Intravenous Q Wed-HD  . doxercalciferol  6 mcg Intravenous Q M,W,F-HD  . ferric gluconate (FERRLECIT/NULECIT) IV  62.5 mg Intravenous Q Wed-HD  . heparin  5,000 Units Subcutaneous Q8H  . HYDROmorphone      . lisinopril  40 mg Oral QHS  . oxyCODONE-acetaminophen      . pantoprazole   80 mg Oral Q1200  . polyethylene glycol  17 g Oral Daily  . sevelamer carbonate  4,000 mg Oral TID WC  . sodium chloride  3 mL Intravenous Q12H  . vancomycin  750 mg Intravenous Q M,W,F-HD

## 2012-12-23 NOTE — Preoperative (Signed)
Beta Blockers   Reason not to administer Beta Blockers:Not Applicable 

## 2012-12-23 NOTE — Anesthesia Procedure Notes (Signed)
Procedure Name: LMA Insertion Date/Time: 12/23/2012 10:46 AM Performed by: Erik Obey Pre-anesthesia Checklist: Patient identified, Timeout performed, Emergency Drugs available, Suction available and Patient being monitored Patient Re-evaluated:Patient Re-evaluated prior to inductionOxygen Delivery Method: Circle system utilized Preoxygenation: Pre-oxygenation with 100% oxygen Intubation Type: IV induction LMA: LMA inserted LMA Size: 4.0 Number of attempts: 1 Placement Confirmation: positive ETCO2 and breath sounds checked- equal and bilateral Tube secured with: Tape Dental Injury: Teeth and Oropharynx as per pre-operative assessment

## 2012-12-23 NOTE — Interval H&P Note (Signed)
History and Physical Interval Note:  12/23/2012 10:38 AM  James Wall  has presented today for surgery, with the diagnosis of End Stage Renal Disease  The various methods of treatment have been discussed with the patient and family. After consideration of risks, benefits and other options for treatment, the patient has consented to  Procedure(s): INSERTION OF DIALYSIS CATHETER (N/A) as a surgical intervention .  The patient's history has been reviewed, patient examined, no change in status, stable for surgery.  I have reviewed the patient's chart and labs.  Questions were answered to the patient's satisfaction.     DICKSON,CHRISTOPHER S

## 2012-12-23 NOTE — Progress Notes (Signed)
[facility]  Subjective  - "My arm is a little sore, but no numbness or pain in my hand."   Objective 135/67 86 99.2 F (37.3 C) (Oral) 18 98%  Intake/Output Summary (Last 24 hours) at 12/23/12 0835 Last data filed at 12/23/12 0818  Gross per 24 hour  Intake    720 ml  Output   1976 ml  Net  -1256 ml   Intraoperative Cultures: No growth to date. Blood cultures positive for MRSA  Left upper extremity ace wrap in place no drainage through the dressing. Palpable radial pulse. Left hand N/V/M intact and equal bilat.  Assessment/Planning: Procedure(s): LIGATION OF ARTERIOVENOUS  FISTULA;EXCISION OF LARGE ANEURYSMS;  1 Day Post-OpSurgeon(s): Elam Dutch, MD OR today for Diatek placement. Plan to change left arm dressing tomorrow.     Laurence Slate [facility] [place] 12/23/2012 8:35 AM --  Laboratory Lab Results:  Recent Labs  12/22/12 0659 12/23/12 0525  WBC 8.4 9.4  HGB 8.2* 8.9*  HCT 24.5* 26.8*  PLT 230 262   BMET  Recent Labs  12/22/12 0659 12/23/12 0525  NA 138 136  K 4.4 4.2  CL 96 94*  CO2 26 28  GLUCOSE 97 77  BUN 44* 24*  CREATININE 13.52* 8.50*  CALCIUM 9.7 9.6    COAG Lab Results  Component Value Date   INR 1.14 03/28/2012   No results found for this basename: PTT

## 2012-12-23 NOTE — Progress Notes (Signed)
Right  Upper Extremity Vein Map    Cephalic  Segment Diameter Depth Comment  1. Axilla 3.46m mm   2. Mid upper arm 4.263mmm   3. Above AC 3.15m69mm   4. In AC 3.15mm72m   5. Below AC 2.1mm 8mBranch (main vessel thrombosed)  6. Mid forearm 1.2mm m61m 7. Wrist mm mm    mm mm    mm mm    mm mm    Basilic  Segment Diameter Depth Comment  1. Axilla 6mm 4.56m 2. Mid upper arm 4.4mm 4.572m  3.11move AC mm mm   4. In AC 3.15mm 2.1mm40m5. B44mw AC 2.1mm 3.3mm B74mch(m59m vessel is thrombosed  6. Mid forearm 1.5mm 3.3mm   74mrist56m mm    mm mm    mm mm    mm mm

## 2012-12-23 NOTE — Anesthesia Preprocedure Evaluation (Addendum)
Anesthesia Evaluation  Patient identified by MRN, date of birth, ID band Patient awake    Reviewed: Allergy & Precautions, H&P , NPO status , Patient's Chart, lab work & pertinent test results  Airway Mallampati: II TM Distance: >3 FB Neck ROM: Full    Dental  (+) Teeth Intact and Dental Advisory Given   Pulmonary asthma , sleep apnea , COPDCurrent Smoker,  breath sounds clear to auscultation        Cardiovascular hypertension, Rhythm:Regular Rate:Normal     Neuro/Psych    GI/Hepatic GERD-  Controlled,  Endo/Other    Renal/GU ESRF and DialysisRenal disease     Musculoskeletal   Abdominal   Peds  Hematology   Anesthesia Other Findings   Reproductive/Obstetrics                         Anesthesia Physical  Anesthesia Plan  ASA: III  Anesthesia Plan: General   Post-op Pain Management:    Induction: Intravenous  Airway Management Planned: LMA  Additional Equipment:   Intra-op Plan:   Post-operative Plan: Extubation in OR  Informed Consent: I have reviewed the patients History and Physical, chart, labs and discussed the procedure including the risks, benefits and alternatives for the proposed anesthesia with the patient or authorized representative who has indicated his/her understanding and acceptance.   Dental advisory given  Plan Discussed with: CRNA, Anesthesiologist and Surgeon  Anesthesia Plan Comments:       Anesthesia Quick Evaluation

## 2012-12-23 NOTE — Progress Notes (Signed)
  Echocardiogram 2D Echocardiogram has been performed.  Diamond Nickel 12/23/2012, 3:53 PM

## 2012-12-23 NOTE — Transfer of Care (Signed)
Immediate Anesthesia Transfer of Care Note  Patient: James Wall  Procedure(s) Performed: Procedure(s): INSERTION OF DIALYSIS CATHETER; ULTRASOUND GUIDED (N/A)  Patient Location: PACU  Anesthesia Type:General  Level of Consciousness: awake, alert  and oriented  Airway & Oxygen Therapy: Patient Spontanous Breathing and Patient connected to nasal cannula oxygen  Post-op Assessment: Report given to PACU RN and Post -op Vital signs reviewed and stable  Post vital signs: Reviewed and stable  Complications: No apparent anesthesia complications

## 2012-12-23 NOTE — Progress Notes (Addendum)
[facility] PROGRESS NOTE  James Wall HYW:[id number] DOB: 05-Dec-1965 DOA: 12/19/2012 PCP: No PCP Per Patient  Interim summary:  Patient is a 47 year old male with past medical history of hypertension and end-stage renal disease on hemodialysis who presented to the emergency room on 11/14 for bleeding from his HD catheter site. His bleeding was treated and he was discharged home. He presented back 48 hours later on 11/16 evening with complaints of fevers, chills and right ears. Initially there was some concern because he had a stiff neck, but this ended up being likely meningismus. Patient was started on broad-spectrum antibiotics. On 11/18 morning, patient's blood cultures grew out positive for gram-positive cocci in clusters.   Assessment/Plan:  1.  MRSA Bacteremia/Sepsis: Likely suspect HD catheter related -clinically improving -Continue vancomycin.  -repeated blood CX from 11/18 negative so far -2D echo completed, no vegetations -s/p HD cath ligation /excision for aneurysm and HD cath placement today  2.  HYPERTENSION:   -stable. Continue home meds.  3. End stage renal disease:    -Nephrology following, for HD, For dialysis today  4. GERD (gastroesophageal reflux disease)    - continue PPI  5. Anemia of chronic disease:    - Secondary to renal disease. Stable.  6. COPD   - stable  Code Status: Full code Family Communication: no family at bedside Disposition Plan: home in 1-2 days   Consultants:  Nephrology  Infectious disease  Procedures:  None  Antibiotics:  Zosyn: One dose on 11/16  Rocephin: 11/16-11/18  Vancomycin 11/16- present  HPI/Subjective: Patient doing okay, no specific complaints  Objective: Filed Vitals:   12/23/12 1400  BP: 135/86  Pulse: 89  Temp: 97.5 F (36.4 C)  Resp: 18    Intake/Output Summary (Last 24 hours) at 12/23/12 1549 Last data filed at 12/23/12 1300  Gross per 24 hour  Intake    630 ml  Output       0 ml  Net    630 ml   Filed Weights   12/21/12 2208 12/22/12 0656 12/22/12 1136  Weight: 75.6 kg (166 lb 10.7 oz) 77.6 kg (171 lb 1.2 oz) 75.4 kg (166 lb 3.6 oz)    Exam:   General:  Alert and oriented x3, no acute distress  Cardiovascular: Regular rate and rhythm, S8-N4, soft 2/6 systolic ejection murmur  Respiratory: Clear to auscultation bilaterally  Abdomen: Soft, nontender, nondistended, positive bowel sounds  Musculoskeletal: No clubbing or cyanosis, trace pitting edema, left upper extremity fistula nontender with no surrounding erythema. A bandage is on earlier bleeding site  Data Reviewed: Basic Metabolic Panel:  Recent Labs Lab 12/19/12 1700 12/20/12 0430 12/22/12 0659 12/23/12 0525  NA 134* 134* 138 136  K 5.1 4.7 4.4 4.2  CL 90* 92* 96 94*  CO2 24 26 26 28   GLUCOSE 124* 107* 97 77  BUN 48* 56* 44* 24*  CREATININE 13.22* 14.73* 13.52* 8.50*  CALCIUM 10.0 9.5 9.7 9.6  PHOS  --   --  4.4  --    Liver Function Tests:  Recent Labs Lab 12/19/12 1700 12/22/12 0659  AST 12  --   ALT 7  --   ALKPHOS 51  --   BILITOT 0.8  --   PROT 8.1  --   ALBUMIN 3.3* 2.8*   No results found for this basename: LIPASE, AMYLASE,  in the last 168 hours No results found for this basename: AMMONIA,  in the last 168 hours CBC:  Recent Labs Lab 12/19/12  1700 12/20/12 0430 12/21/12 0714 12/22/12 0659 12/23/12 0525  WBC 12.8* 9.0 8.1 8.4 9.4  HGB 10.8* 9.2* 8.5* 8.2* 8.9*  HCT 31.7* 26.9* 25.3* 24.5* 26.8*  MCV 98.4 97.1 98.1 97.6 98.5  PLT 198 179 198 230 262   Cardiac Enzymes:  Recent Labs Lab 12/21/12 2142  TROPONINI <0.30   BNP (last 3 results) No results found for this basename: PROBNP,  in the last 8760 hours CBG:  Recent Labs Lab 12/22/12 1735  GLUCAP 82    Recent Results (from the past 240 hour(s))  CULTURE, BLOOD (ROUTINE X 2)     Status: None   Collection Time    12/19/12 12:50 AM      Result Value Range Status   Specimen Description  BLOOD RIGHT ARM   Final   Special Requests BOTTLES DRAWN AEROBIC AND ANAEROBIC 10CC EACH   Final   Culture  Setup Time     Final   Value: 12/20/2012 08:13     Performed at [facility]   Culture     Final   Value:        BLOOD CULTURE RECEIVED NO GROWTH TO DATE CULTURE WILL BE HELD FOR 5 DAYS BEFORE ISSUING A FINAL NEGATIVE REPORT     Performed at [facility]   Report Status PENDING   Incomplete  CULTURE, BLOOD (ROUTINE X 2)     Status: None   Collection Time    12/19/12  5:00 PM      Result Value Range Status   Specimen Description BLOOD RIGHT FOREARM   Final   Special Requests     Final   Value: BOTTLES DRAWN AEROBIC AND ANAEROBIC 10CC BLUE 5CC RED   Culture  Setup Time     Final   Value: 12/20/2012 04:30     Performed at [facility]   Culture     Final   Value: METHICILLIN RESISTANT STAPHYLOCOCCUS AUREUS     Note: RIFAMPIN AND GENTAMICIN SHOULD NOT BE USED AS SINGLE DRUGS FOR TREATMENT OF STAPH INFECTIONS. CRITICAL RESULT CALLED TO, READ BACK BY AND VERIFIED WITH: ASHLEY TONEY 12/21/12 1356 BY SMITHERJ This organism DOES NOT demonstrate inducible Clindamycin      resistance in vitro.     Note: Gram Stain Report Called to,Read Back By and Verified With: KATHY WICKER ON 12/20/2012 AT 8:50P BY WILEJ     Performed at [facility]   Report Status 12/22/2012 FINAL   Final   Organism ID, Bacteria METHICILLIN RESISTANT STAPHYLOCOCCUS AUREUS   Final  MRSA PCR SCREENING     Status: None   Collection Time    12/20/12  5:46 AM      Result Value Range Status   MRSA by PCR NEGATIVE  NEGATIVE Final   Comment:            The GeneXpert MRSA Assay (FDA     approved for NASAL specimens     only), is one component of a     comprehensive MRSA colonization     surveillance program. It is not     intended to diagnose MRSA     infection nor to guide or     monitor treatment for     MRSA infections.  WOUND CULTURE     Status: None   Collection Time    12/21/12   1:42 PM      Result Value Range Status   Specimen Description ARM AV FISTULA DRAINAGE L  ARM   Final   Special Requests Immunocompromised   Final   Gram Stain     Final   Value: NO WBC SEEN     NO SQUAMOUS EPITHELIAL CELLS SEEN     RARE GRAM POSITIVE COCCI IN PAIRS     Performed at [facility]   Culture     Final   Value: MODERATE STAPHYLOCOCCUS AUREUS     Note: RIFAMPIN AND GENTAMICIN SHOULD NOT BE USED AS SINGLE DRUGS FOR TREATMENT OF STAPH INFECTIONS.     Performed at [facility]   Report Status PENDING   Incomplete  CULTURE, BLOOD (ROUTINE X 2)     Status: None   Collection Time    12/21/12  4:04 PM      Result Value Range Status   Specimen Description BLOOD RIGHT ANTECUBITAL   Final   Special Requests BOTTLES DRAWN AEROBIC ONLY 10CC   Final   Culture  Setup Time     Final   Value: 12/21/2012 21:27     Performed at [facility]   Culture     Final   Value:        BLOOD CULTURE RECEIVED NO GROWTH TO DATE CULTURE WILL BE HELD FOR 5 DAYS BEFORE ISSUING A FINAL NEGATIVE REPORT     Performed at [facility]   Report Status PENDING   Incomplete  CULTURE, BLOOD (ROUTINE X 2)     Status: None   Collection Time    12/21/12  4:10 PM      Result Value Range Status   Specimen Description BLOOD RIGHT HAND   Final   Special Requests BOTTLES DRAWN AEROBIC ONLY 8CC   Final   Culture  Setup Time     Final   Value: 12/21/2012 21:31     Performed at [facility]   Culture     Final   Value:        BLOOD CULTURE RECEIVED NO GROWTH TO DATE CULTURE WILL BE HELD FOR 5 DAYS BEFORE ISSUING A FINAL NEGATIVE REPORT     Performed at [facility]   Report Status PENDING   Incomplete  SURGICAL PCR SCREEN     Status: Abnormal   Collection Time    12/22/12 12:03 PM      Result Value Range Status   MRSA, PCR NEGATIVE  NEGATIVE Final   Staphylococcus aureus POSITIVE (*) NEGATIVE Final   Comment:            The Xpert SA Assay (FDA     approved for  NASAL specimens     in patients over 27 years of age),     is one component of     a comprehensive surveillance     program.  Test performance has     been validated by [employer] for patients greater     than or equal to 3 year old.     It is not intended     to diagnose infection nor to     guide or monitor treatment.  WOUND CULTURE     Status: None   Collection Time    12/22/12  3:26 PM      Result Value Range Status   Specimen Description WOUND ARM LEFT   Final   Special Requests LEFT FISTULA PT ON VANCOMYCIN   Final   Gram Stain     Final   Value: RARE WBC PRESENT,BOTH  PMN AND MONONUCLEAR     NO SQUAMOUS EPITHELIAL CELLS SEEN     NO ORGANISMS SEEN     Performed at [facility]   Culture     Final   Value: NO GROWTH     Performed at [facility]   Report Status PENDING   Incomplete     Studies: Dg Chest Port 1 View  12/23/2012   CLINICAL DATA:  New dialysis catheter.  EXAM: PORTABLE CHEST - 1 VIEW  COMPARISON:  PA and lateral chest 12/19/2012.  FINDINGS: The patient has a new left IJ approach double lumen central venous catheter. Tip of the distal lumen projects over the right atrium with the proximal in projecting over the superior cavoatrial junction. There is cardiomegaly and mild interstitial edema. No pneumothorax or pleural fluid.  IMPRESSION: New dialysis catheter projects in good position.  No pneumothorax.  Cardiomegaly and mild interstitial edema.   Electronically Signed   By: Inge Rise M.D.   On: 12/23/2012 12:27    Scheduled Meds: . amLODipine  10 mg Oral Daily  . [START ON 12/26/2012] cloNIDine  0.3 mg Transdermal Weekly  . [START ON 12/29/2012] darbepoetin  60 mcg Intravenous Q Wed-HD  . doxercalciferol  6 mcg Intravenous Q M,W,F-HD  . ferric gluconate (FERRLECIT/NULECIT) IV  62.5 mg Intravenous Q Wed-HD  . heparin  5,000 Units Subcutaneous Q8H  . HYDROmorphone      . lisinopril  40 mg Oral QHS  . oxyCODONE-acetaminophen       . pantoprazole  80 mg Oral Q1200  . polyethylene glycol  17 g Oral Daily  . sevelamer carbonate  4,000 mg Oral TID WC  . sodium chloride  3 mL Intravenous Q12H  . vancomycin  750 mg Intravenous Q M,W,F-HD   Continuous Infusions:   Principal Problem:   Sepsis Active Problems:   HYPERTENSION   End stage renal disease   GERD (gastroesophageal reflux disease)   Anemia of renal disease    Time spent: 25 minutes    [facility] Pager [phone].. If 7PM-7AM, please contact night-coverage at [email or web address], password [facility] 12/23/2012, 3:49 PM  LOS: 4 days

## 2012-12-23 NOTE — H&P (View-Only) (Signed)
[facility]   Subjective  - "My arm is a little sore, but no numbness or pain in my hand."   Objective 135/67 86 99.2 F (37.3 C) (Oral) 18 98%  Intake/Output Summary (Last 24 hours) at 12/23/12 0835 Last data filed at 12/23/12 0818  Gross per 24 hour  Intake    720 ml  Output   1976 ml  Net  -1256 ml   Intraoperative Cultures: No growth to date. Blood cultures positive for MRSA  Left upper extremity ace wrap in place no drainage through the dressing. Palpable radial pulse. Left hand N/V/M intact and equal bilat.  Assessment/Planning: Procedure(s): LIGATION OF ARTERIOVENOUS  FISTULA;EXCISION OF LARGE ANEURYSMS;  1 Day Post-OpSurgeon(s): Elam Dutch, MD OR today for Diatek placement. Plan to change left arm dressing tomorrow.     Laurence Slate [facility] 12/23/2012 8:35 AM --  Laboratory Lab Results:  Recent Labs  12/22/12 0659 12/23/12 0525  WBC 8.4 9.4  HGB 8.2* 8.9*  HCT 24.5* 26.8*  PLT 230 262   BMET  Recent Labs  12/22/12 0659 12/23/12 0525  NA 138 136  K 4.4 4.2  CL 96 94*  CO2 26 28  GLUCOSE 97 77  BUN 44* 24*  CREATININE 13.52* 8.50*  CALCIUM 9.7 9.6    COAG Lab Results  Component Value Date   INR 1.14 03/28/2012   No results found for this basename: PTT

## 2012-12-24 ENCOUNTER — Encounter (HOSPITAL_COMMUNITY): Payer: Self-pay | Admitting: Vascular Surgery

## 2012-12-24 DIAGNOSIS — T827XXA Infection and inflammatory reaction due to other cardiac and vascular devices, implants and grafts, initial encounter: Principal | ICD-10-CM

## 2012-12-24 DIAGNOSIS — N19 Unspecified kidney failure: Secondary | ICD-10-CM

## 2012-12-24 LAB — IRON AND TIBC
Iron: 22 ug/dL — ABNORMAL LOW (ref 42–135)
Saturation Ratios: 16 % — ABNORMAL LOW (ref 20–55)
TIBC: 138 ug/dL — ABNORMAL LOW (ref 215–435)
UIBC: 116 ug/dL — ABNORMAL LOW (ref 125–400)

## 2012-12-24 LAB — RENAL FUNCTION PANEL
BUN: 33 mg/dL — ABNORMAL HIGH (ref 6–23)
CO2: 25 mEq/L (ref 19–32)
Chloride: 96 mEq/L (ref 96–112)
GFR calc Af Amer: 6 mL/min — ABNORMAL LOW (ref >90)
Glucose, Bld: 85 mg/dL (ref 70–99)
Potassium: 4.7 mEq/L (ref 3.5–5.1)

## 2012-12-24 LAB — CBC
HCT: 23.6 % — ABNORMAL LOW (ref 39.0–52.0)
Hemoglobin: 8 g/dL — ABNORMAL LOW (ref 13.0–17.0)
MCHC: 33.9 g/dL (ref 30.0–36.0)
MCV: 97.9 fL (ref 78.0–100.0)
RBC: 2.41 MIL/uL — ABNORMAL LOW (ref 4.22–5.81)
WBC: 9 10*3/uL (ref 4.0–10.5)

## 2012-12-24 LAB — WOUND CULTURE: Culture: NO GROWTH

## 2012-12-24 LAB — FERRITIN: Ferritin: 1082 ng/mL — ABNORMAL HIGH (ref 22–322)

## 2012-12-24 MED ORDER — RENA-VITE PO TABS
1.0000 | ORAL_TABLET | Freq: Every day | ORAL | Status: DC
  Filled 2012-12-24: qty 1

## 2012-12-24 MED ORDER — VANCOMYCIN HCL IN DEXTROSE 750-5 MG/150ML-% IV SOLN: 750.0000 mg | INTRAVENOUS | Status: DC

## 2012-12-24 MED ORDER — POLYETHYLENE GLYCOL 3350 17 G PO PACK
17.0000 g | PACK | Freq: Two times a day (BID) | ORAL | Status: DC
  Administered 2012-12-24: 17 g via ORAL
  Filled 2012-12-24 (×2): qty 1

## 2012-12-24 MED ORDER — DOXERCALCIFEROL 4 MCG/2ML IV SOLN
INTRAVENOUS | Status: AC
  Administered 2012-12-24: 6 ug via INTRAVENOUS
  Filled 2012-12-24: qty 4

## 2012-12-24 NOTE — Procedures (Signed)
I was present at this dialysis session, have reviewed the session itself and made  appropriate changes   Kelly Splinter MD  pager [phone]    cell [phone]  12/24/2012, 9:21 AM

## 2012-12-24 NOTE — Progress Notes (Signed)
Discussed discharge instructions and medications with pt. Pt showed no barriers to discharge. IV removed. Pt discharged to home with friend. Assessment unchanged from morning.

## 2012-12-24 NOTE — Progress Notes (Signed)
[facility] [place]  Subjective  - Doing well. The diatek catheter is working fine for dialysis currently.  Left upper arm is sore, but no symptoms of numbness or weakness in the hand.   Objective 162/106 98 99 F (37.2 C) (Oral) 18 95%  Intake/Output Summary (Last 24 hours) at 12/24/12 5449 Last data filed at 12/23/12 1800  Gross per 24 hour  Intake    530 ml  Output      0 ml  Net    530 ml    Distally the left hand is N/V/M intact. Incision is clean and dry.  Compartments are soft without hematoma. Diatek in place and functioning well on dialysis.  Assessment/Planning: Procedure(s): INSERTION OF DIALYSIS CATHETER; ULTRASOUND GUIDED  1 Day Post-OpSurgeon(s): Angelia Mould, MD  F/U with Dr. [name] in 2-3 weeks for wound check. Vein mapping complete on right upper extremity today, pending results to plan permanent access in the future once infection is cleared up.  Laurence Slate [facility] 12/24/2012 8:22 AM --  Laboratory Lab Results:  Recent Labs  12/23/12 0525 12/24/12 0635  WBC 9.4 9.0  HGB 8.9* 8.0*  HCT 26.8* 23.6*  PLT 262 289   BMET  Recent Labs  12/23/12 0525 12/24/12 0635  NA 136 137  K 4.2 4.7  CL 94* 96  CO2 28 25  GLUCOSE 77 85  BUN 24* 33*  CREATININE 8.50* 10.83*  CALCIUM 9.6 9.4    COAG Lab Results  Component Value Date   INR 1.14 03/28/2012   No results found for this basename: PTT

## 2012-12-24 NOTE — Progress Notes (Signed)
[facility] Progress Note  Subjective:   No recent BM.  Not eating well because he hates the food.  Doesn't want IV Fe on an empty stomach  Objective Filed Vitals:   12/24/12 0700 12/24/12 0701 12/24/12 0730 12/24/12 0800  BP: 173/107 162/101 155/106 162/106  Pulse: 96 95 106 98  Temp: 99 F (37.2 C)     TempSrc: Oral     Resp: 18     Height:      Weight: 76.3 kg (168 lb 3.4 oz)     SpO2: 95%      Physical Exam on HDb goal 12.6 General:NAD on HD Heart: tachy reg Lungs: dim BS Abdomen: distended soft + BS Extremities: tr edema Dialysis Access: new left I-J cath at Qb 400 ; prior ligated access unwarpped healing well  Dialysis orders: MWF at [facility]  4hrs 78kg 2K/2Ca 450/A1.5 Heparin 1800 LUA AVF  Hectorol 6ug Epo 1800 Venofer 50/wk   Assessment/Plan: 1. Fever / MRSA bacteremia - TMax 99 Likely due to infected HD access. On Vanc - **NEW MRSA precautions at d/c 2. HD Access - Infected aneurysmal L AVF ligated per Dr. [name] on 11/19. New left IJ cath placed 11/20 - needs f/u at [facility] for acces after d/c; vein mapping ordered for today 3. ESRD: HD today - goal 2 L; will have lower edw at d/c 4. Anemia - Hgb 8.9 > 8.2.>8  Cont aranesp 60/wk and on IV fe weekly - check Fe levels. Follow CBC.- advised to give IV fe after HD after he eats  5. Secondary hyperparathyroidism - Ca 9.6 (10.6 corrected). PTH in range. Continue Renvela, Vit D, low Ca bath. 6. HTN/volume - SBPs 140s. Cont meds. Below dry wt. CXR mild inst edema - BP 160s , lower edw at d/c 7. Nutrition - Alb 2.8. Renal diet, multivitamin 8. Disp - poss d/c today - finishe course of Vanc at dialysis and do surveillance BC  Myriam Jacobson, PA-C [place] [phone] 12/24/2012,8:13 AM  LOS: 5 days   I have seen and examined patient, discussed with PA and agree with assessment and plan as outlined above. Kelly Splinter MD pager [phone]    cell [phone] 12/24/2012, 9:22  AM    Additional Objective Labs: Basic Metabolic Panel:  Recent Labs Lab 12/22/12 0659 12/23/12 0525 12/24/12 0635  NA 138 136 137  K 4.4 4.2 4.7  CL 96 94* 96  CO2 26 28 25   GLUCOSE 97 77 85  BUN 44* 24* 33*  CREATININE 13.52* 8.50* 10.83*  CALCIUM 9.7 9.6 9.4  PHOS 4.4  --  5.0*   Liver Function Tests:  Recent Labs Lab 12/19/12 1700 12/22/12 0659 12/24/12 0635  AST 12  --   --   ALT 7  --   --   ALKPHOS 51  --   --   BILITOT 0.8  --   --   PROT 8.1  --   --   ALBUMIN 3.3* 2.8* 2.6*   CBC:  Recent Labs Lab 12/20/12 0430 12/21/12 0714 12/22/12 0659 12/23/12 0525 12/24/12 0635  WBC 9.0 8.1 8.4 9.4 9.0  HGB 9.2* 8.5* 8.2* 8.9* 8.0*  HCT 26.9* 25.3* 24.5* 26.8* 23.6*  MCV 97.1 98.1 97.6 98.5 97.9  PLT 179 198 230 262 289   Blood Culture    Component Value Date/Time   SDES WOUND ARM LEFT 12/22/2012 1526   SPECREQUEST LEFT FISTULA PT ON VANCOMYCIN 12/22/2012 1526   CULT  Value:  NO GROWTH 2 DAYS Performed at [facility] 12/22/2012 1526   REPTSTATUS 12/24/2012 FINAL 12/22/2012 1526  Studies/Results: Dg Chest Port 1 View  12/23/2012   CLINICAL DATA:  New dialysis catheter.  EXAM: PORTABLE CHEST - 1 VIEW  COMPARISON:  PA and lateral chest 12/19/2012.  FINDINGS: The patient has a new left IJ approach double lumen central venous catheter. Tip of the distal lumen projects over the right atrium with the proximal in projecting over the superior cavoatrial junction. There is cardiomegaly and mild interstitial edema. No pneumothorax or pleural fluid.  IMPRESSION: New dialysis catheter projects in good position.  No pneumothorax.  Cardiomegaly and mild interstitial edema.   Electronically Signed   By: Inge Rise M.D.   On: 12/23/2012 12:27   Medications:   . amLODipine  10 mg Oral Daily  . [START ON 12/26/2012] cloNIDine  0.3 mg Transdermal Weekly  . [START ON 12/29/2012] darbepoetin  60 mcg Intravenous Q Wed-HD  . doxercalciferol  6 mcg Intravenous  Q M,W,F-HD  . [START ON 12/27/2012] ferric gluconate (FERRLECIT/NULECIT) IV  62.5 mg Intravenous Q Mon-HD  . heparin  5,000 Units Subcutaneous Q8H  . lisinopril  40 mg Oral QHS  . pantoprazole  80 mg Oral Q1200  . polyethylene glycol  17 g Oral Daily  . sevelamer carbonate  4,000 mg Oral TID WC  . sodium chloride  3 mL Intravenous Q12H  . vancomycin  750 mg Intravenous Q M,W,F-HD

## 2012-12-24 NOTE — Progress Notes (Signed)
[facility]   Day # 6 vancomycin  Subjective: No new complaints   Antibiotics:  Anti-infectives   Start     Dose/Rate Route Frequency Ordered Stop   12/20/12 2200  cefTRIAXone (ROCEPHIN) 1 g in dextrose 5 % 50 mL IVPB  Status:  Discontinued     1 g 100 mL/hr over 30 Minutes Intravenous Every 24 hours 12/20/12 1420 12/21/12 0757   12/20/12 1200  vancomycin (VANCOCIN) IVPB 750 mg/150 ml premix     750 mg 150 mL/hr over 60 Minutes Intravenous Every M-W-F (Hemodialysis) 12/19/12 2111     12/19/12 2200  cefTRIAXone (ROCEPHIN) 2 g in dextrose 5 % 50 mL IVPB  Status:  Discontinued     2 g 100 mL/hr over 30 Minutes Intravenous Every 12 hours 12/19/12 2046 12/20/12 1420   12/19/12 2115  vancomycin (VANCOCIN) IVPB 750 mg/150 ml premix     750 mg 150 mL/hr over 60 Minutes Intravenous NOW 12/19/12 2111 12/19/12 2237   12/19/12 1645  piperacillin-tazobactam (ZOSYN) IVPB 3.375 g     3.375 g 12.5 mL/hr over 240 Minutes Intravenous  Once 12/19/12 1644 12/19/12 1751   12/19/12 1645  vancomycin (VANCOCIN) IVPB 1000 mg/200 mL premix     1,000 mg 200 mL/hr over 60 Minutes Intravenous  Once 12/19/12 1644 12/19/12 1952      Medications: Scheduled Meds: . amLODipine  10 mg Oral Daily  . [START ON 12/26/2012] cloNIDine  0.3 mg Transdermal Weekly  . [START ON 12/29/2012] darbepoetin  60 mcg Intravenous Q Wed-HD  . doxercalciferol  6 mcg Intravenous Q M,W,F-HD  . [START ON 12/27/2012] ferric gluconate (FERRLECIT/NULECIT) IV  62.5 mg Intravenous Q Mon-HD  . heparin  5,000 Units Subcutaneous Q8H  . lisinopril  40 mg Oral QHS  . multivitamin  1 tablet Oral QHS  . pantoprazole  80 mg Oral Q1200  . polyethylene glycol  17 g Oral BID  . sevelamer carbonate  4,000 mg Oral TID WC  . sodium chloride  3 mL Intravenous Q12H  . vancomycin  750 mg Intravenous Q M,W,F-HD   Continuous Infusions:  PRN Meds:.acetaminophen, acetaminophen, cyclobenzaprine,  HYDROcodone-acetaminophen, oxyCODONE-acetaminophen   Objective: Weight change: -8 lb 6 oz (-3.8 kg)  Intake/Output Summary (Last 24 hours) at 12/24/12 1313 Last data filed at 12/24/12 1015  Gross per 24 hour  Intake    120 ml  Output    700 ml  Net   -580 ml   Blood pressure 165/114, pulse 116, temperature 99 F (37.2 C), temperature source Oral, resp. rate 18, height 5' 6"  (1.676 m), weight 167 lb 12.3 oz (76.1 kg), SpO2 95.00%. Temp:  [97.5 F (36.4 C)-99.1 F (37.3 C)] 99 F (37.2 C) (11/21 0700) Pulse Rate:  [89-116] 116 (11/21 1015) Resp:  [17-18] 18 (11/21 0700) BP: (125-173)/(80-114) 165/114 mmHg (11/21 1015) SpO2:  [95 %-100 %] 95 % (11/21 0700) Weight:  [157 lb 13.6 oz (71.6 kg)-168 lb 3.4 oz (76.3 kg)] 167 lb 12.3 oz (76.1 kg) (11/21 1015)  Physical Exam: General: Alert and awake, oriented x3, not in any acute distress getting HD via AVF  HEENT: anicteric sclera, EOMI CVS regular rate, normal r,  no murmur rubs or gallops Chest: clear to auscultation bilaterally, no wheezing, rales or rhonchi Abdomen:nondistended, normal bowel sounds,  Neuro: nonfocal Skin: AVF will site is clean  New HD catheter is clean  Lab Results:  Recent Labs  12/23/12 0525 12/24/12 0635  WBC 9.4 9.0  HGB 8.9* 8.0*  HCT 26.8* 23.6*  PLT 262 289    BMET  Recent Labs  12/23/12 0525 12/24/12 0635  NA 136 137  K 4.2 4.7  CL 94* 96  CO2 28 25  GLUCOSE 77 85  BUN 24* 33*  CREATININE 8.50* 10.83*  CALCIUM 9.6 9.4    Micro Results: Recent Results (from the past 240 hour(s))  CULTURE, BLOOD (ROUTINE X 2)     Status: None   Collection Time    12/19/12 12:50 AM      Result Value Range Status   Specimen Description BLOOD RIGHT ARM   Final   Special Requests BOTTLES DRAWN AEROBIC AND ANAEROBIC 10CC EACH   Final   Culture  Setup Time     Final   Value: 12/20/2012 08:13     Performed at [facility]   Culture     Final   Value:        BLOOD CULTURE RECEIVED NO  GROWTH TO DATE CULTURE WILL BE HELD FOR 5 DAYS BEFORE ISSUING A FINAL NEGATIVE REPORT     Performed at [facility]   Report Status PENDING   Incomplete  CULTURE, BLOOD (ROUTINE X 2)     Status: None   Collection Time    12/19/12  5:00 PM      Result Value Range Status   Specimen Description BLOOD RIGHT FOREARM   Final   Special Requests     Final   Value: BOTTLES DRAWN AEROBIC AND ANAEROBIC 10CC BLUE 5CC RED   Culture  Setup Time     Final   Value: 12/20/2012 04:30     Performed at [facility]   Culture     Final   Value: METHICILLIN RESISTANT STAPHYLOCOCCUS AUREUS     Note: RIFAMPIN AND GENTAMICIN SHOULD NOT BE USED AS SINGLE DRUGS FOR TREATMENT OF STAPH INFECTIONS. CRITICAL RESULT CALLED TO, READ BACK BY AND VERIFIED WITH: ASHLEY TONEY 12/21/12 1356 BY SMITHERJ This organism DOES NOT demonstrate inducible Clindamycin      resistance in vitro.     Note: Gram Stain Report Called to,Read Back By and Verified With: KATHY WICKER ON 12/20/2012 AT 8:50P BY WILEJ     Performed at [facility]   Report Status 12/22/2012 FINAL   Final   Organism ID, Bacteria METHICILLIN RESISTANT STAPHYLOCOCCUS AUREUS   Final  MRSA PCR SCREENING     Status: None   Collection Time    12/20/12  5:46 AM      Result Value Range Status   MRSA by PCR NEGATIVE  NEGATIVE Final   Comment:            The GeneXpert MRSA Assay (FDA     approved for NASAL specimens     only), is one component of a     comprehensive MRSA colonization     surveillance program. It is not     intended to diagnose MRSA     infection nor to guide or     monitor treatment for     MRSA infections.  WOUND CULTURE     Status: None   Collection Time    12/21/12  1:42 PM      Result Value Range Status   Specimen Description ARM AV FISTULA DRAINAGE L ARM   Final   Special Requests Immunocompromised   Final   Gram Stain     Final   Value: NO WBC SEEN  NO SQUAMOUS EPITHELIAL CELLS SEEN     RARE GRAM POSITIVE  COCCI IN PAIRS     Performed at [facility]   Culture     Final   Value: MODERATE METHICILLIN RESISTANT STAPHYLOCOCCUS AUREUS     Note: RIFAMPIN AND GENTAMICIN SHOULD NOT BE USED AS SINGLE DRUGS FOR TREATMENT OF STAPH INFECTIONS. This organism DOES NOT demonstrate inducible Clindamycin resistance in vitro. CRITICAL RESULT CALLED TO, READ BACK BY AND VERIFIED WITH: C.HAYWOOD 11/21      @930  BY REAMM     Performed at [facility]   Report Status 12/24/2012 FINAL   Final   Organism ID, Bacteria METHICILLIN RESISTANT STAPHYLOCOCCUS AUREUS   Final  CULTURE, BLOOD (ROUTINE X 2)     Status: None   Collection Time    12/21/12  4:04 PM      Result Value Range Status   Specimen Description BLOOD RIGHT ANTECUBITAL   Final   Special Requests BOTTLES DRAWN AEROBIC ONLY 10CC   Final   Culture  Setup Time     Final   Value: 12/21/2012 21:27     Performed at [facility]   Culture     Final   Value:        BLOOD CULTURE RECEIVED NO GROWTH TO DATE CULTURE WILL BE HELD FOR 5 DAYS BEFORE ISSUING A FINAL NEGATIVE REPORT     Performed at [facility]   Report Status PENDING   Incomplete  CULTURE, BLOOD (ROUTINE X 2)     Status: None   Collection Time    12/21/12  4:10 PM      Result Value Range Status   Specimen Description BLOOD RIGHT HAND   Final   Special Requests BOTTLES DRAWN AEROBIC ONLY 8CC   Final   Culture  Setup Time     Final   Value: 12/21/2012 21:31     Performed at [facility]   Culture     Final   Value:        BLOOD CULTURE RECEIVED NO GROWTH TO DATE CULTURE WILL BE HELD FOR 5 DAYS BEFORE ISSUING A FINAL NEGATIVE REPORT     Performed at [facility]   Report Status PENDING   Incomplete  SURGICAL PCR SCREEN     Status: Abnormal   Collection Time    12/22/12 12:03 PM      Result Value Range Status   MRSA, PCR NEGATIVE  NEGATIVE Final   Staphylococcus aureus POSITIVE (*) NEGATIVE Final   Comment:            The Xpert SA Assay (FDA      approved for NASAL specimens     in patients over 24 years of age),     is one component of     a comprehensive surveillance     program.  Test performance has     been validated by [employer] for patients greater     than or equal to 27 year old.     It is not intended     to diagnose infection nor to     guide or monitor treatment.  WOUND CULTURE     Status: None   Collection Time    12/22/12  3:26 PM      Result Value Range Status   Specimen Description WOUND ARM LEFT   Final   Special Requests LEFT FISTULA PT ON VANCOMYCIN   Final  Gram Stain     Final   Value: RARE WBC PRESENT,BOTH PMN AND MONONUCLEAR     NO SQUAMOUS EPITHELIAL CELLS SEEN     NO ORGANISMS SEEN     Performed at [facility]   Culture     Final   Value: NO GROWTH 2 DAYS     Performed at [facility]   Report Status 12/24/2012 FINAL   Final    Studies/Results: Dg Chest Port 1 View  12/23/2012   CLINICAL DATA:  New dialysis catheter.  EXAM: PORTABLE CHEST - 1 VIEW  COMPARISON:  PA and lateral chest 12/19/2012.  FINDINGS: The patient has a new left IJ approach double lumen central venous catheter. Tip of the distal lumen projects over the right atrium with the proximal in projecting over the superior cavoatrial junction. There is cardiomegaly and mild interstitial edema. No pneumothorax or pleural fluid.  IMPRESSION: New dialysis catheter projects in good position.  No pneumothorax.  Cardiomegaly and mild interstitial edema.   Electronically Signed   By: Inge Rise M.D.   On: 12/23/2012 12:27      Assessment/Plan: James Wall is a 47 y.o. male with  47 year old with ESRD via AVF in left arm with recent bleeding from AVF drainage admitted with fevers, rigors and found to have MRSA in the blood.   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 Pt doesn't want to undergo removal of his HD catheter though I told him there is a chance IT now could be infected and that we might have recurrence and have to repeat this whole course   A TEE would also be helpful to ensure he does not have need for CT surgery for endocarditis  I am going to treat him for 6 weeks with IV vancomycin postoperatively     #2 Screening:  HIV and Hep A and C are negative   Dr. Johnnye Sima to cover me tomorrow.    LOS: 5 days   Alcide Evener 12/24/2012, 1:13 PM

## 2012-12-24 NOTE — Progress Notes (Signed)
Hemodialysis- Telephone call from [facility], wound culture +MRSA. Dr. Jonnie Finner notified

## 2012-12-24 NOTE — Discharge Summary (Signed)
Physician Discharge Summary  James Wall DIY:[id number] DOB: 1965/02/15 DOA: 12/19/2012  PCP: No PCP Per Patient  Admit date: 12/19/2012 Discharge date: 12/24/2012  Time spent: 45 minutes  Recommendations for Outpatient Follow-up:  1. Dr.Van Dam in 2 weeks 2. Surveillance blood Cx after abx completed 3. Continue IV Vanc with HD for 6 weeks  Discharge Diagnoses:    Sepsis   MRSA Bacteremia   HYPERTENSION   End stage renal disease   GERD (gastroesophageal reflux disease)   Anemia of renal disease   Discharge Condition: stable  Diet recommendation: Renal  Filed Weights   12/23/12 2138 12/24/12 0700 12/24/12 1015  Weight: 71.6 kg (157 lb 13.6 oz) 76.3 kg (168 lb 3.4 oz) 76.1 kg (167 lb 12.3 oz)    History of present illness:  Chief Complaint: Rigors  HPI: James Wall is a 47 y.o. male who presents to the ED with c/o fevers, chills, rigors. He has a h/o ESRD with dialysis MWF. He also reports c/o stiff neck associated with this illness, he is unable to flex, extend, or rotate his neck at this time which is new.   Hospital Course:  Interim summary:  Patient is a 47 year old male with past medical history of hypertension and end-stage renal disease on hemodialysis who presented to the emergency room on 11/14 for bleeding from his HD catheter site. His bleeding was treated and he was discharged home. He presented back 48 hours later on 11/16 evening with complaints of fevers, chills and right ears. Initially there was some concern because he had a stiff neck, but this ended up being likely meningismus. Patient was started on broad-spectrum antibiotics. On 11/18 morning, patient's blood cultures grew out positive for gram-positive cocci in clusters.   Assessment/Plan:  1. MRSA Bacteremia/Sepsis:  -AVF related infection -clinically improving  -Continue vancomycin for 6 weeks with HD, starting 11/20.  -repeated blood CX from 11/18 negative so far  -2D echo completed,  no vegetations  -s/p HD cath ligation /excision for aneurysm and  -s/p HD cath placement 11/20 -ideally TEE needed but patient adamant to go home today and TEE can only be done on Monday now -d/w Dr.VanDam, will discharge home on IV Vanc for 6 weeks, surveillance cultures after abx completed   2. HYPERTENSION:  -stable. Continue home meds.   3. End stage renal disease:  -Nephrology following, for HD, For dialysis today  -continue VAnc at HD  4. GERD (gastroesophageal reflux disease)  - continue PPI   5. Anemia of chronic disease:  - Secondary to renal disease. Stable.   6. COPD  - stable   PROCEDURE:  1. Ultrasound-guided placement of 27 cm left IJ tunneled dialysis catheter 11/20 2. Procedure: Ligation of left brachiocephalic AV fistula with excision of left brachiocephalic AV fistula   Discharge Exam: Filed Vitals:   12/24/12 1015  BP: 165/114  Pulse: 116  Temp:   Resp:     General: AAOx3 Cardiovascular: S1S2/RRR Respiratory: CTAB  Discharge Instructions  Discharge Orders   Future Orders Complete By Expires   Discharge instructions  As directed    Comments:     Renal Diet   Increase activity slowly  As directed        Medication List         amLODipine 10 MG tablet  Commonly known as:  NORVASC  Take 10 mg by mouth daily.     cloNIDine 0.3 mg/24hr patch  Commonly known as:  CATAPRES - Dosed in mg/24  hr  Place 1 patch onto the skin once a week.     esomeprazole 40 MG capsule  Commonly known as:  NEXIUM  Take 40 mg by mouth daily before breakfast.     lisinopril 20 MG tablet  Commonly known as:  PRINIVIL,ZESTRIL  Take 40 mg by mouth at bedtime.     sevelamer carbonate 800 MG tablet  Commonly known as:  RENVELA  Take 4,000 mg by mouth 3 (three) times daily with meals.     Vancomycin 750 MG/150ML Soln  Commonly known as:  VANCOCIN  Inject 150 mLs (750 mg total) into the vein every Monday, Wednesday, and Friday with hemodialysis.        Allergies  Allergen Reactions  . Aspirin     Tends to make him "feel worse" when he tries to take it for a cold or something       Follow-up Information   Follow up with Elam Dutch, MD In 2 weeks. (sent to office)    Specialty:  Vascular Surgery   Contact information:   [address] [place] [address] [phone]       Follow up with Alcide Evener, MD. Schedule an appointment as soon as possible for a visit in 2 weeks.   Specialty:  Infectious Diseases   Contact information:   [address] [place] [address] [phone]        The results of significant diagnostics from this hospitalization (including imaging, microbiology, ancillary and laboratory) are listed below for reference.    Significant Diagnostic Studies: Dg Chest 2 View  12/19/2012   CLINICAL DATA:  Weakness, chills  EXAM: CHEST  2 VIEW  COMPARISON:  03/28/2012  FINDINGS: No focal pulmonary opacity allowing for degree of hypoaeration. Heart size is mildly enlarged with central vascular congestion but no overt edema. No pleural effusion. No acute osseous finding.  IMPRESSION: Cardiomegaly with central congestion but no focal acute finding.   Electronically Signed   By: Conchita Paris M.D.   On: 12/19/2012 17:35   Dg Chest Port 1 View  12/23/2012   CLINICAL DATA:  New dialysis catheter.  EXAM: PORTABLE CHEST - 1 VIEW  COMPARISON:  PA and lateral chest 12/19/2012.  FINDINGS: The patient has a new left IJ approach double lumen central venous catheter. Tip of the distal lumen projects over the right atrium with the proximal in projecting over the superior cavoatrial junction. There is cardiomegaly and mild interstitial edema. No pneumothorax or pleural fluid.  IMPRESSION: New dialysis catheter projects in good position.  No pneumothorax.  Cardiomegaly and mild interstitial edema.   Electronically Signed   By: Inge Rise M.D.   On: 12/23/2012 12:27     Microbiology: Recent Results (from the past 240 hour(s))  CULTURE, BLOOD (ROUTINE X 2)     Status: None   Collection Time    12/19/12 12:50 AM      Result Value Range Status   Specimen Description BLOOD RIGHT ARM   Final   Special Requests BOTTLES DRAWN AEROBIC AND ANAEROBIC 10CC EACH   Final   Culture  Setup Time     Final   Value: 12/20/2012 08:13     Performed at [facility]   Culture     Final   Value:        BLOOD CULTURE RECEIVED NO GROWTH TO DATE CULTURE WILL BE HELD FOR 5 DAYS BEFORE ISSUING A FINAL NEGATIVE REPORT  Performed at [facility]   Report Status PENDING   Incomplete  CULTURE, BLOOD (ROUTINE X 2)     Status: None   Collection Time    12/19/12  5:00 PM      Result Value Range Status   Specimen Description BLOOD RIGHT FOREARM   Final   Special Requests     Final   Value: BOTTLES DRAWN AEROBIC AND ANAEROBIC 10CC BLUE 5CC RED   Culture  Setup Time     Final   Value: 12/20/2012 04:30     Performed at [facility]   Culture     Final   Value: METHICILLIN RESISTANT STAPHYLOCOCCUS AUREUS     Note: RIFAMPIN AND GENTAMICIN SHOULD NOT BE USED AS SINGLE DRUGS FOR TREATMENT OF STAPH INFECTIONS. CRITICAL RESULT CALLED TO, READ BACK BY AND VERIFIED WITH: ASHLEY TONEY 12/21/12 1356 BY SMITHERJ This organism DOES NOT demonstrate inducible Clindamycin      resistance in vitro.     Note: Gram Stain Report Called to,Read Back By and Verified With: KATHY WICKER ON 12/20/2012 AT 8:50P BY WILEJ     Performed at [facility]   Report Status 12/22/2012 FINAL   Final   Organism ID, Bacteria METHICILLIN RESISTANT STAPHYLOCOCCUS AUREUS   Final  MRSA PCR SCREENING     Status: None   Collection Time    12/20/12  5:46 AM      Result Value Range Status   MRSA by PCR NEGATIVE  NEGATIVE Final   Comment:            The GeneXpert MRSA Assay (FDA     approved for NASAL specimens     only), is one component of a     comprehensive MRSA colonization      surveillance program. It is not     intended to diagnose MRSA     infection nor to guide or     monitor treatment for     MRSA infections.  WOUND CULTURE     Status: None   Collection Time    12/21/12  1:42 PM      Result Value Range Status   Specimen Description ARM AV FISTULA DRAINAGE L ARM   Final   Special Requests Immunocompromised   Final   Gram Stain     Final   Value: NO WBC SEEN     NO SQUAMOUS EPITHELIAL CELLS SEEN     RARE GRAM POSITIVE COCCI IN PAIRS     Performed at [facility]   Culture     Final   Value: MODERATE METHICILLIN RESISTANT STAPHYLOCOCCUS AUREUS     Note: RIFAMPIN AND GENTAMICIN SHOULD NOT BE USED AS SINGLE DRUGS FOR TREATMENT OF STAPH INFECTIONS. This organism DOES NOT demonstrate inducible Clindamycin resistance in vitro. CRITICAL RESULT CALLED TO, READ BACK BY AND VERIFIED WITH: C.HAYWOOD 11/21      @930  BY REAMM     Performed at [facility]   Report Status 12/24/2012 FINAL   Final   Organism ID, Bacteria METHICILLIN RESISTANT STAPHYLOCOCCUS AUREUS   Final  CULTURE, BLOOD (ROUTINE X 2)     Status: None   Collection Time    12/21/12  4:04 PM      Result Value Range Status   Specimen Description BLOOD RIGHT ANTECUBITAL   Final   Special Requests BOTTLES DRAWN AEROBIC ONLY 10CC   Final   Culture  Setup Time     Final   Value:  12/21/2012 21:27     Performed at [facility]   Culture     Final   Value:        BLOOD CULTURE RECEIVED NO GROWTH TO DATE CULTURE WILL BE HELD FOR 5 DAYS BEFORE ISSUING A FINAL NEGATIVE REPORT     Performed at [facility]   Report Status PENDING   Incomplete  CULTURE, BLOOD (ROUTINE X 2)     Status: None   Collection Time    12/21/12  4:10 PM      Result Value Range Status   Specimen Description BLOOD RIGHT HAND   Final   Special Requests BOTTLES DRAWN AEROBIC ONLY 8CC   Final   Culture  Setup Time     Final   Value: 12/21/2012 21:31     Performed at [facility]   Culture      Final   Value:        BLOOD CULTURE RECEIVED NO GROWTH TO DATE CULTURE WILL BE HELD FOR 5 DAYS BEFORE ISSUING A FINAL NEGATIVE REPORT     Performed at [facility]   Report Status PENDING   Incomplete  SURGICAL PCR SCREEN     Status: Abnormal   Collection Time    12/22/12 12:03 PM      Result Value Range Status   MRSA, PCR NEGATIVE  NEGATIVE Final   Staphylococcus aureus POSITIVE (*) NEGATIVE Final   Comment:            The Xpert SA Assay (FDA     approved for NASAL specimens     in patients over 50 years of age),     is one component of     a comprehensive surveillance     program.  Test performance has     been validated by [employer] for patients greater     than or equal to 40 year old.     It is not intended     to diagnose infection nor to     guide or monitor treatment.  WOUND CULTURE     Status: None   Collection Time    12/22/12  3:26 PM      Result Value Range Status   Specimen Description WOUND ARM LEFT   Final   Special Requests LEFT FISTULA PT ON VANCOMYCIN   Final   Gram Stain     Final   Value: RARE WBC PRESENT,BOTH PMN AND MONONUCLEAR     NO SQUAMOUS EPITHELIAL CELLS SEEN     NO ORGANISMS SEEN     Performed at [facility]   Culture     Final   Value: NO GROWTH 2 DAYS     Performed at [facility]   Report Status 12/24/2012 FINAL   Final     Labs: Basic Metabolic Panel:  Recent Labs Lab 12/19/12 1700 12/20/12 0430 12/22/12 0659 12/23/12 0525 12/24/12 0635  NA 134* 134* 138 136 137  K 5.1 4.7 4.4 4.2 4.7  CL 90* 92* 96 94* 96  CO2 24 26 26 28 25   GLUCOSE 124* 107* 97 77 85  BUN 48* 56* 44* 24* 33*  CREATININE 13.22* 14.73* 13.52* 8.50* 10.83*  CALCIUM 10.0 9.5 9.7 9.6 9.4  PHOS  --   --  4.4  --  5.0*   Liver Function Tests:  Recent Labs Lab 12/19/12 1700 12/22/12 0659 12/24/12 0635  AST 12  --   --  ALT 7  --   --   ALKPHOS 51  --   --   BILITOT 0.8  --   --   PROT 8.1  --   --   ALBUMIN 3.3* 2.8*  2.6*   No results found for this basename: LIPASE, AMYLASE,  in the last 168 hours No results found for this basename: AMMONIA,  in the last 168 hours CBC:  Recent Labs Lab 12/20/12 0430 12/21/12 0714 12/22/12 0659 12/23/12 0525 12/24/12 0635  WBC 9.0 8.1 8.4 9.4 9.0  HGB 9.2* 8.5* 8.2* 8.9* 8.0*  HCT 26.9* 25.3* 24.5* 26.8* 23.6*  MCV 97.1 98.1 97.6 98.5 97.9  PLT 179 198 230 262 289   Cardiac Enzymes:  Recent Labs Lab 12/21/12 2142  TROPONINI <0.30   BNP: BNP (last 3 results) No results found for this basename: PROBNP,  in the last 8760 hours CBG:  Recent Labs Lab 12/22/12 1735  GLUCAP 82       Signed:  Aulani Shipton  [facility] 12/24/2012, 2:32 PM

## 2012-12-26 LAB — CULTURE, BLOOD (ROUTINE X 2)

## 2012-12-27 ENCOUNTER — Telehealth: Payer: Self-pay | Admitting: Vascular Surgery

## 2012-12-27 LAB — CULTURE, BLOOD (ROUTINE X 2)

## 2012-12-27 NOTE — Telephone Encounter (Addendum)
Message copied by Gena Fray on Mon Dec 27, 2012 11:37 AM ------      Message from: Alfonso Patten      Created: Mon Dec 27, 2012  9:54 AM      Regarding: FW: f/u                   ----- Message -----         From: Angelia Mould, MD         Sent: 12/25/2012   7:21 AM           To: Alfonso Patten, RN, Lynetta Mare Pullins, RN      Subject: f/u                                                      This pt was discharged Friday      He needs a f/u apt to check on his left arm where CEF removed an AVF. (Maybe that has already been arranged). At that time he will need to be scheduled for a right BC AVF.      Thanks      CD ------  12/27/12: spoke with patient, dpm

## 2012-12-28 ENCOUNTER — Encounter (HOSPITAL_COMMUNITY): Payer: Self-pay | Admitting: Vascular Surgery

## 2012-12-29 DIAGNOSIS — A4102 Sepsis due to Methicillin resistant Staphylococcus aureus: Secondary | ICD-10-CM | POA: Diagnosis not present

## 2013-01-02 DIAGNOSIS — N186 End stage renal disease: Secondary | ICD-10-CM | POA: Diagnosis not present

## 2013-01-03 DIAGNOSIS — N186 End stage renal disease: Secondary | ICD-10-CM | POA: Diagnosis not present

## 2013-01-03 DIAGNOSIS — Z992 Dependence on renal dialysis: Secondary | ICD-10-CM | POA: Diagnosis not present

## 2013-01-03 DIAGNOSIS — D631 Anemia in chronic kidney disease: Secondary | ICD-10-CM | POA: Diagnosis not present

## 2013-01-03 DIAGNOSIS — A4102 Sepsis due to Methicillin resistant Staphylococcus aureus: Secondary | ICD-10-CM | POA: Diagnosis not present

## 2013-01-03 DIAGNOSIS — D509 Iron deficiency anemia, unspecified: Secondary | ICD-10-CM | POA: Diagnosis not present

## 2013-01-03 DIAGNOSIS — N2581 Secondary hyperparathyroidism of renal origin: Secondary | ICD-10-CM | POA: Diagnosis not present

## 2013-01-06 ENCOUNTER — Encounter: Payer: Self-pay | Admitting: Infectious Disease

## 2013-01-06 ENCOUNTER — Ambulatory Visit (INDEPENDENT_AMBULATORY_CARE_PROVIDER_SITE_OTHER): Payer: Medicare Other | Admitting: Infectious Disease

## 2013-01-06 VITALS — BP 166/120 | HR 106 | Temp 98.4°F | Wt 171.0 lb

## 2013-01-06 DIAGNOSIS — N186 End stage renal disease: Secondary | ICD-10-CM

## 2013-01-06 DIAGNOSIS — R160 Hepatomegaly, not elsewhere classified: Secondary | ICD-10-CM | POA: Diagnosis not present

## 2013-01-06 DIAGNOSIS — R7881 Bacteremia: Secondary | ICD-10-CM | POA: Diagnosis not present

## 2013-01-06 DIAGNOSIS — Z992 Dependence on renal dialysis: Secondary | ICD-10-CM

## 2013-01-06 DIAGNOSIS — R1030 Lower abdominal pain, unspecified: Secondary | ICD-10-CM

## 2013-01-06 DIAGNOSIS — K439 Ventral hernia without obstruction or gangrene: Secondary | ICD-10-CM | POA: Diagnosis not present

## 2013-01-06 DIAGNOSIS — R188 Other ascites: Secondary | ICD-10-CM | POA: Diagnosis not present

## 2013-01-06 DIAGNOSIS — R109 Unspecified abdominal pain: Secondary | ICD-10-CM

## 2013-01-06 DIAGNOSIS — B9562 Methicillin resistant Staphylococcus aureus infection as the cause of diseases classified elsewhere: Secondary | ICD-10-CM

## 2013-01-06 DIAGNOSIS — T827XXD Infection and inflammatory reaction due to other cardiac and vascular devices, implants and grafts, subsequent encounter: Secondary | ICD-10-CM

## 2013-01-06 DIAGNOSIS — K7460 Unspecified cirrhosis of liver: Secondary | ICD-10-CM | POA: Diagnosis not present

## 2013-01-06 DIAGNOSIS — Z5189 Encounter for other specified aftercare: Secondary | ICD-10-CM

## 2013-01-06 DIAGNOSIS — A4902 Methicillin resistant Staphylococcus aureus infection, unspecified site: Secondary | ICD-10-CM | POA: Diagnosis not present

## 2013-01-06 NOTE — Progress Notes (Signed)
Subjective:    Patient ID: James Wall, male    DOB: [birth year], 47 y.o.   MRN: [id number]  HPI  James Wall is a 47 y.o. 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 He underwent surgery to AVF site and ligation performed and grew MRSA from the AV fistula. He had repeat blood cultures that were negative prior to surgery I had wanted another set of blood cultures post AV fistula surgery to be obtained in 2 have his insertion of a fresh hemodialysis catheter delayed until we had at minimum an additional day between AV fistula surgery and insertion of a new dialysis catheter. Unfortunately this did not occur.  The patient is doing well currently receiving vancomycin with hemodialysis.  He has several other concerns today:  #1 he was concerned about some pain along his groin bilaterally he has some bilateral hernias just superior to these areas that are bothering him.  #2 he was concerned by his abdominal distention: I looked back into Epic and found that an ultrasound the abdomen had indeed disclosed evidence of hepatomegaly with ascites. He was composed unaware of this diagnosis he denies being a drinker he has no viral hepatitides. I would like to refer him to GI for further workup.  He states that he has also lost quite a bit of weight over last year.  #3: He is been taking some baclofen as well as an over-the-counter Tylenol with aspirin and is inquiring of the safety of these drugs although is my understanding that the baclofen was prescribed by his nephrologist Dr. Moshe Cipro. I will forward my note to her.   Review of Systems  Constitutional: Positive for fatigue and unexpected weight change. Negative for fever, chills, diaphoresis, activity change and appetite change.  HENT: Negative for congestion, rhinorrhea, sinus pressure, sneezing, sore throat and  trouble swallowing.   Eyes: Negative for photophobia and visual disturbance.  Respiratory: Negative for cough, chest tightness, shortness of breath, wheezing and stridor.   Cardiovascular: Negative for chest pain, palpitations and leg swelling.  Gastrointestinal: Positive for abdominal pain and abdominal distention. Negative for nausea, vomiting, diarrhea, constipation, blood in stool and anal bleeding.  Genitourinary: Negative for dysuria, hematuria, flank pain and difficulty urinating.  Musculoskeletal: Negative for arthralgias, back pain, gait problem, joint swelling and myalgias.  Skin: Negative for color change, pallor, rash and wound.  Neurological: Negative for dizziness, tremors, weakness and light-headedness.  Hematological: Negative for adenopathy. Does not bruise/bleed easily.  Psychiatric/Behavioral: Negative for behavioral problems, confusion, sleep disturbance, dysphoric mood, decreased concentration and agitation.       Objective:   Physical Exam  Constitutional: He is oriented to person, place, and time. He appears well-developed and well-nourished. No distress.  HENT:  Head: Normocephalic and atraumatic.  Mouth/Throat: No oropharyngeal exudate.  Eyes: Conjunctivae and EOM are normal. No scleral icterus.  Neck: Normal range of motion. Neck supple.  Cardiovascular: Normal rate and regular rhythm.   Pulmonary/Chest: Breath sounds normal. No respiratory distress. He has no wheezes.  Abdominal: He exhibits shifting dullness, distension and ascites. He exhibits no mass. There is hepatomegaly. There is no tenderness. There is no rebound and no guarding. Hernia confirmed negative in the right inguinal area and confirmed negative in the left inguinal area.  Genitourinary:    Right testis shows tenderness. Right testis shows no mass and no swelling.  Left testis shows tenderness. Left testis shows no mass and no swelling.  Musculoskeletal: He exhibits edema. He exhibits no  tenderness.  Lymphadenopathy:       Right: Inguinal adenopathy present.       Left: Inguinal adenopathy present.  Neurological: He is alert and oriented to person, place, and time. He exhibits normal muscle tone. Coordination normal.  Skin: Skin is warm and dry. He is not diaphoretic. No pallor.     Psychiatric: He has a normal mood and affect. His behavior is normal. Judgment and thought content normal.              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  --Check surveillance cultures 2 weeks after finishing IV antibiotics.  I spent greater than 45 minutes with the patient including greater than 50% of time in face to face counsel of the patient and in coordination of their care.   Hepatomegaly with ascites: Referred to gastroenterology.  Pain in groin: Not clear what the cause of this is he does have hernias bilaterally with air superior to the area that is bothering him.  Musculoskeletal pain being treated with baclofen and nonsteroidals defer to nephrology.  He needs a primary care physician.

## 2013-01-06 NOTE — Patient Instructions (Signed)
Your IV antibiotics need to continue thru the 30th of December with dialysis  I would like you to come back to see [place] on the 14th or later so we can check surveillance blood cultures

## 2013-01-07 ENCOUNTER — Encounter: Payer: Self-pay | Admitting: Gastroenterology

## 2013-01-07 ENCOUNTER — Telehealth: Payer: Self-pay | Admitting: Licensed Clinical Social Worker

## 2013-01-07 NOTE — Telephone Encounter (Signed)
Message copied by Janan Halter on Fri Jan 07, 2013 12:14 PM ------      Message from: Jarrett Ables D      Created: Thu Jan 06, 2013 12:00 PM       Refer to GI for ascitis ------

## 2013-01-13 ENCOUNTER — Other Ambulatory Visit: Payer: Self-pay | Admitting: Gastroenterology

## 2013-01-13 DIAGNOSIS — R109 Unspecified abdominal pain: Secondary | ICD-10-CM | POA: Diagnosis not present

## 2013-01-13 DIAGNOSIS — R188 Other ascites: Secondary | ICD-10-CM | POA: Diagnosis not present

## 2013-01-13 DIAGNOSIS — R112 Nausea with vomiting, unspecified: Secondary | ICD-10-CM | POA: Diagnosis not present

## 2013-01-18 ENCOUNTER — Ambulatory Visit
Admission: RE | Admit: 2013-01-18 | Discharge: 2013-01-18 | Disposition: A | Discharge status: Home / Self Care | Payer: Medicare Other | Source: Ambulatory Visit | Attending: Gastroenterology | Admitting: Gastroenterology

## 2013-01-18 DIAGNOSIS — N281 Cyst of kidney, acquired: Secondary | ICD-10-CM | POA: Diagnosis not present

## 2013-01-18 DIAGNOSIS — K7460 Unspecified cirrhosis of liver: Secondary | ICD-10-CM | POA: Diagnosis not present

## 2013-01-18 DIAGNOSIS — R188 Other ascites: Secondary | ICD-10-CM

## 2013-01-19 ENCOUNTER — Encounter: Payer: Self-pay | Admitting: Vascular Surgery

## 2013-01-20 ENCOUNTER — Encounter (INDEPENDENT_AMBULATORY_CARE_PROVIDER_SITE_OTHER): Payer: Self-pay

## 2013-01-20 ENCOUNTER — Other Ambulatory Visit (HOSPITAL_COMMUNITY): Payer: Self-pay | Admitting: Gastroenterology

## 2013-01-20 ENCOUNTER — Encounter: Payer: Self-pay | Admitting: Vascular Surgery

## 2013-01-20 ENCOUNTER — Ambulatory Visit (INDEPENDENT_AMBULATORY_CARE_PROVIDER_SITE_OTHER): Payer: Self-pay | Admitting: Vascular Surgery

## 2013-01-20 VITALS — BP 159/101 | HR 79 | Ht 66.0 in | Wt 169.0 lb

## 2013-01-20 DIAGNOSIS — R188 Other ascites: Secondary | ICD-10-CM

## 2013-01-20 DIAGNOSIS — K7460 Unspecified cirrhosis of liver: Secondary | ICD-10-CM

## 2013-01-20 DIAGNOSIS — N186 End stage renal disease: Secondary | ICD-10-CM

## 2013-01-20 NOTE — Progress Notes (Signed)
She is a 47 year old male returns for followup today. 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 He is currently dialyzing via a catheter on Monday Wednesday Friday. His dialysis center is [facility]. He denies any drainage from the incision.  Past Medical History  Diagnosis Date  . Chronic kidney disease   . Hypertension   . COPD (chronic obstructive pulmonary disease)   . Arthritis     GOUT  . Anemia   . Secondary hyperparathyroidism      Physical exam:  Filed Vitals:   01/20/13 0840  BP: 159/101  Pulse: 79  Height: 5' 6"  (1.676 m)  Weight: 169 lb (76.658 kg)  SpO2: 100%    Left upper extremity healing incision still has some numbness in the forearm but is improving 2+ brachial and radial pulse left arm  Right upper extremity: 2+ brachial and radial pulse palpable cephalic vein  Data: Vein mapping from prior visit was reviewed which shows upper arm cephalic vein is 3 mm or more in diameter.  Assessment: Healing left upper extremity, patient wishes to defer placement of a new access until later in January.  Plan: Right brachiocephalic AV fistula 43/60/6770. Risks benefits possible complications procedure details were explained the patient today including but limited to bleeding infection ischemic steal non-maturation of the fistula.  Ruta Hinds, MD [facility] Office: [phone] Pager: [phone]

## 2013-01-25 ENCOUNTER — Ambulatory Visit: Payer: Medicare Other | Admitting: Diagnostic Neuroimaging

## 2013-01-25 ENCOUNTER — Ambulatory Visit (HOSPITAL_COMMUNITY): Payer: Medicare Other

## 2013-01-31 ENCOUNTER — Ambulatory Visit (HOSPITAL_COMMUNITY)
Admission: RE | Admit: 2013-01-31 | Discharge: 2013-01-31 | Disposition: A | Discharge status: Home / Self Care | Payer: Medicare Other | Source: Ambulatory Visit | Attending: Gastroenterology | Admitting: Gastroenterology

## 2013-01-31 DIAGNOSIS — R188 Other ascites: Secondary | ICD-10-CM

## 2013-01-31 DIAGNOSIS — R899 Unspecified abnormal finding in specimens from other organs, systems and tissues: Secondary | ICD-10-CM | POA: Diagnosis not present

## 2013-01-31 DIAGNOSIS — K7460 Unspecified cirrhosis of liver: Secondary | ICD-10-CM

## 2013-01-31 LAB — BODY FLUID CELL COUNT WITH DIFFERENTIAL
Eos, Fluid: 0 %
Lymphs, Fluid: 19 %
Monocyte-Macrophage-Serous Fluid: 80 % (ref 50–90)

## 2013-01-31 MED ORDER — ALBUMIN HUMAN 25 % IV SOLN
25.0000 g | Freq: Once | INTRAVENOUS | Status: AC
  Administered 2013-01-31: 25 g via INTRAVENOUS
  Filled 2013-01-31: qty 100

## 2013-02-02 DIAGNOSIS — N186 End stage renal disease: Secondary | ICD-10-CM | POA: Diagnosis not present

## 2013-02-03 DIAGNOSIS — A419 Sepsis, unspecified organism: Secondary | ICD-10-CM

## 2013-02-03 HISTORY — DX: Sepsis, unspecified organism: A41.9

## 2013-02-03 LAB — BODY FLUID CULTURE: Culture: NO GROWTH

## 2013-02-04 DIAGNOSIS — D509 Iron deficiency anemia, unspecified: Secondary | ICD-10-CM | POA: Diagnosis not present

## 2013-02-04 DIAGNOSIS — D631 Anemia in chronic kidney disease: Secondary | ICD-10-CM | POA: Diagnosis not present

## 2013-02-04 DIAGNOSIS — N186 End stage renal disease: Secondary | ICD-10-CM | POA: Diagnosis not present

## 2013-02-04 DIAGNOSIS — A4102 Sepsis due to Methicillin resistant Staphylococcus aureus: Secondary | ICD-10-CM | POA: Diagnosis not present

## 2013-02-04 DIAGNOSIS — Z992 Dependence on renal dialysis: Secondary | ICD-10-CM | POA: Diagnosis not present

## 2013-02-04 DIAGNOSIS — N2581 Secondary hyperparathyroidism of renal origin: Secondary | ICD-10-CM | POA: Diagnosis not present

## 2013-02-07 DIAGNOSIS — N186 End stage renal disease: Secondary | ICD-10-CM | POA: Diagnosis not present

## 2013-02-07 DIAGNOSIS — A4102 Sepsis due to Methicillin resistant Staphylococcus aureus: Secondary | ICD-10-CM | POA: Diagnosis not present

## 2013-02-07 DIAGNOSIS — D509 Iron deficiency anemia, unspecified: Secondary | ICD-10-CM | POA: Diagnosis not present

## 2013-02-07 DIAGNOSIS — N2581 Secondary hyperparathyroidism of renal origin: Secondary | ICD-10-CM | POA: Diagnosis not present

## 2013-02-07 DIAGNOSIS — Z992 Dependence on renal dialysis: Secondary | ICD-10-CM | POA: Diagnosis not present

## 2013-02-07 DIAGNOSIS — D631 Anemia in chronic kidney disease: Secondary | ICD-10-CM | POA: Diagnosis not present

## 2013-02-08 ENCOUNTER — Ambulatory Visit (INDEPENDENT_AMBULATORY_CARE_PROVIDER_SITE_OTHER): Payer: Medicare Other | Admitting: Diagnostic Neuroimaging

## 2013-02-08 ENCOUNTER — Encounter: Payer: Self-pay | Admitting: Diagnostic Neuroimaging

## 2013-02-08 VITALS — BP 188/97 | HR 59 | Ht 65.0 in | Wt 169.0 lb

## 2013-02-08 DIAGNOSIS — M542 Cervicalgia: Secondary | ICD-10-CM | POA: Diagnosis not present

## 2013-02-08 DIAGNOSIS — R51 Headache: Secondary | ICD-10-CM

## 2013-02-08 NOTE — Progress Notes (Signed)
[facility]  PATIENT: James Wall DOB: Jun 21, 1965  REFERRING CLINICIAN: Moshe Cipro HISTORY FROM: patient  REASON FOR VISIT: new consult   HISTORICAL  CHIEF COMPLAINT:  Chief Complaint  Patient presents with  . Headache    Np# 6    HISTORY OF PRESENT ILLNESS:   48 year old right-handed male with history of hypertension, end-stage renal disease on hemodialysis for past 6 years, here for evaluation of headaches.  November 2014 patient went to the hospital with fever, headache, neck pain. In the emergency room lumbar puncture was attempted several times but unsuccessful. Patient was diagnosed and treated for MRSA bacteremia and sepsis.  Since that time patient has continued to have occipital and right posterior neck pain and headache. He describes a muscle spasm, "crick in neck" sensation with throbbing and pressure sensation. Symptoms last one to 2 hours and time. No nausea or vomiting. No vision changes. No photophobia or phonophobia. Patient has never had a similar type of pain in the past. He feels that his symptoms have developed since the lumbar puncture, but on review of hospital notes the symptoms may have preceded the lumbar puncture.  Patient has had intermittent mild headaches in the past in taking a medication called Vanquish (APAP/aspirin/caffeine).   Other ongoing medical issues include liver cirrhosis and ascites, status post abdominal paracentesis.  REVIEW OF SYSTEMS: Full 14 system review of systems performed and notable only for weight loss and headache.  ALLERGIES: Allergies  Allergen Reactions  . Aspirin     Tends to make him "feel worse" when he tries to take it for a cold or something    HOME MEDICATIONS: Outpatient Prescriptions Prior to Visit  Medication Sig Dispense Refill  . amLODipine (NORVASC) 10 MG tablet Take 10 mg by mouth daily.      . cloNIDine (CATAPRES - DOSED IN MG/24 HR) 0.3 mg/24hr Place 1 patch onto the  skin once a week.      . esomeprazole (NEXIUM) 40 MG capsule Take 40 mg by mouth daily before breakfast.      . lisinopril (PRINIVIL,ZESTRIL) 20 MG tablet Take 40 mg by mouth at bedtime.      . sevelamer carbonate (RENVELA) 800 MG tablet Take 4,000 mg by mouth 3 (three) times daily with meals.       . Vancomycin (VANCOCIN) 750 MG/150ML SOLN Inject 150 mLs (750 mg total) into the vein every Monday, Wednesday, and Friday with hemodialysis.       No facility-administered medications prior to visit.    PAST MEDICAL HISTORY: Past Medical History  Diagnosis Date  . Chronic kidney disease   . Hypertension   . COPD (chronic obstructive pulmonary disease)   . Arthritis     GOUT  . Anemia   . Secondary hyperparathyroidism     PAST SURGICAL HISTORY: Past Surgical History  Procedure Laterality Date  . Av fistula placement  2009    Left lower arm AVF  . Ligation of arteriovenous  fistula Left 12/22/2012    Procedure: LIGATION OF ARTERIOVENOUS  FISTULA;EXCISION OF LARGE ANEURYSMS;;  Surgeon: Elam Dutch, MD;  Location: [place] [facility];  Service: Vascular;  Laterality: Left;  . Insertion of dialysis catheter N/A 12/23/2012    Procedure: INSERTION OF DIALYSIS CATHETER; ULTRASOUND GUIDED;  Surgeon: Angelia Mould, MD;  Location[facility];  Service: Vascular;  Laterality: N/A;    FAMILY HISTORY: Family History  Problem Relation Age of Onset  . Stroke Father     SOCIAL HISTORY:  History   Social History  . Marital Status: Single    Spouse Name: N/A    Number of Children: 0  . Years of Education: 12th   Occupational History  . n/a    Social History Main Topics  . Smoking status: Current Every Day Smoker -- 0.50 packs/day    Types: Cigarettes  . Smokeless tobacco: Not on file  . Alcohol Use: Yes     Comment: occassional  . Drug Use: No  . Sexual Activity: Not on file   Other Topics Concern  . Not on file   Social History Narrative  . No narrative on file     PHYSICAL  EXAM  Filed Vitals:   02/08/13 1012  BP: 188/97  Pulse: 59  Height: 5' 5"  (1.651 m)  Weight: 169 lb (76.658 kg)    Not recorded    Body mass index is 28.12 kg/(m^2).  GENERAL EXAM: Patient is in no distress; well developed, nourished and groomed; neck is supple  CARDIOVASCULAR: Regular rate and rhythm, no murmurs, no carotid bruits  NEUROLOGIC: MENTAL STATUS: awake, alert, oriented to person, place and time, recent and remote memory intact, normal attention and concentration, language fluent, comprehension intact, naming intact, fund of knowledge appropriate CRANIAL NERVE: no papilledema on fundoscopic exam, pupils equal and reactive to light, visual fields full to confrontation, extraocular muscles intact, no nystagmus, facial sensation and strength symmetric, hearing intact, palate elevates symmetrically, uvula midline, shoulder shrug symmetric, tongue midline. MOTOR: normal bulk and tone, full strength in the BUE, BLE SENSORY: normal and symmetric to light touch, pinprick, temperature; VIBRATION 6 SEC AT TOES. COORDINATION: finger-nose-finger, fine finger movements, heel-shin normal REFLEXES: deep tendon reflexes present and symmetric GAIT/STATION: narrow based gait; able to walk on toes, heels and tandem; romberg is negative    DIAGNOSTIC DATA (LABS, IMAGING, TESTING) - I reviewed patient records, labs, notes, testing and imaging myself where available.  Lab Results  Component Value Date   WBC 9.0 12/24/2012   HGB 8.0* 12/24/2012   HCT 23.6* 12/24/2012   MCV 97.9 12/24/2012   PLT 289 12/24/2012      Component Value Date/Time   NA 137 12/24/2012 0635   K 4.7 12/24/2012 0635   CL 96 12/24/2012 0635   CO2 25 12/24/2012 0635   GLUCOSE 85 12/24/2012 0635   BUN 33* 12/24/2012 0635   CREATININE 10.83* 12/24/2012 0635   CALCIUM 9.4 12/24/2012 0635   CALCIUM 7.7* 02/18/2007 1522   PROT 8.1 12/19/2012 1700   ALBUMIN 2.6* 12/24/2012 0635   AST 12 12/19/2012 1700   ALT  7 12/19/2012 1700   ALKPHOS 51 12/19/2012 1700   BILITOT 0.8 12/19/2012 1700   GFRNONAA 5* 12/24/2012 0635   GFRAA 6* 12/24/2012 0635   No results found for this basename: CHOL, HDL, LDLCALC, LDLDIRECT, TRIG, CHOLHDL   No results found for this basename: HGBA1C   No results found for this basename: VITAMINB12   No results found for this basename: TSH    ASSESSMENT AND PLAN  48 y.o. year old male here with headaches since Nov 2014, following lumbar puncture attempt (x 7 in the ER, but unsuccessful).   Ddx: cervicogenic headache, occipital neuralgia, secondary HA (subdural, structural, intracranial hypotension)  PLAN: Orders Placed This Encounter  Procedures  . MR Brain Wo Contrast  . MR Cervical Spine Wo Contrast  - OTC pain medications for HA for now (excedrin migraine or ibuprofen prn)  Return in about 3 months (around 05/09/2013) for with  Jarold Motto, MD 08/08/1913, 50:27 AM Certified in Neurology, Neurophysiology and Neuroimaging  [facility] [address], [place], [place] [address] [phone]

## 2013-02-09 ENCOUNTER — Ambulatory Visit: Payer: Medicare Other | Admitting: Gastroenterology

## 2013-02-09 DIAGNOSIS — N186 End stage renal disease: Secondary | ICD-10-CM | POA: Diagnosis not present

## 2013-02-09 DIAGNOSIS — N2581 Secondary hyperparathyroidism of renal origin: Secondary | ICD-10-CM | POA: Diagnosis not present

## 2013-02-09 DIAGNOSIS — Z992 Dependence on renal dialysis: Secondary | ICD-10-CM | POA: Diagnosis not present

## 2013-02-09 DIAGNOSIS — A4102 Sepsis due to Methicillin resistant Staphylococcus aureus: Secondary | ICD-10-CM | POA: Diagnosis not present

## 2013-02-09 DIAGNOSIS — D509 Iron deficiency anemia, unspecified: Secondary | ICD-10-CM | POA: Diagnosis not present

## 2013-02-09 DIAGNOSIS — D631 Anemia in chronic kidney disease: Secondary | ICD-10-CM | POA: Diagnosis not present

## 2013-02-11 ENCOUNTER — Other Ambulatory Visit: Payer: Self-pay | Admitting: *Deleted

## 2013-02-11 ENCOUNTER — Encounter: Payer: Self-pay | Admitting: *Deleted

## 2013-02-11 DIAGNOSIS — N186 End stage renal disease: Secondary | ICD-10-CM | POA: Diagnosis not present

## 2013-02-11 DIAGNOSIS — D631 Anemia in chronic kidney disease: Secondary | ICD-10-CM | POA: Diagnosis not present

## 2013-02-11 DIAGNOSIS — N2581 Secondary hyperparathyroidism of renal origin: Secondary | ICD-10-CM | POA: Diagnosis not present

## 2013-02-11 DIAGNOSIS — A4102 Sepsis due to Methicillin resistant Staphylococcus aureus: Secondary | ICD-10-CM | POA: Diagnosis not present

## 2013-02-11 DIAGNOSIS — Z992 Dependence on renal dialysis: Secondary | ICD-10-CM | POA: Diagnosis not present

## 2013-02-11 DIAGNOSIS — D509 Iron deficiency anemia, unspecified: Secondary | ICD-10-CM | POA: Diagnosis not present

## 2013-02-14 ENCOUNTER — Encounter (HOSPITAL_COMMUNITY): Payer: Self-pay | Admitting: Respiratory Therapy

## 2013-02-14 DIAGNOSIS — N186 End stage renal disease: Secondary | ICD-10-CM | POA: Diagnosis not present

## 2013-02-14 DIAGNOSIS — Z992 Dependence on renal dialysis: Secondary | ICD-10-CM | POA: Diagnosis not present

## 2013-02-14 DIAGNOSIS — A4102 Sepsis due to Methicillin resistant Staphylococcus aureus: Secondary | ICD-10-CM | POA: Diagnosis not present

## 2013-02-14 DIAGNOSIS — D631 Anemia in chronic kidney disease: Secondary | ICD-10-CM | POA: Diagnosis not present

## 2013-02-14 DIAGNOSIS — D509 Iron deficiency anemia, unspecified: Secondary | ICD-10-CM | POA: Diagnosis not present

## 2013-02-14 DIAGNOSIS — N2581 Secondary hyperparathyroidism of renal origin: Secondary | ICD-10-CM | POA: Diagnosis not present

## 2013-02-15 ENCOUNTER — Ambulatory Visit (INDEPENDENT_AMBULATORY_CARE_PROVIDER_SITE_OTHER): Payer: Medicare Other | Admitting: Infectious Disease

## 2013-02-15 ENCOUNTER — Encounter: Payer: Self-pay | Admitting: Infectious Disease

## 2013-02-15 VITALS — BP 185/119 | HR 67 | Temp 97.3°F | Wt 169.0 lb

## 2013-02-15 DIAGNOSIS — R109 Unspecified abdominal pain: Secondary | ICD-10-CM | POA: Diagnosis not present

## 2013-02-15 DIAGNOSIS — K439 Ventral hernia without obstruction or gangrene: Secondary | ICD-10-CM | POA: Diagnosis not present

## 2013-02-15 DIAGNOSIS — R7881 Bacteremia: Secondary | ICD-10-CM | POA: Diagnosis not present

## 2013-02-15 DIAGNOSIS — R739 Hyperglycemia, unspecified: Secondary | ICD-10-CM | POA: Insufficient documentation

## 2013-02-15 DIAGNOSIS — K7460 Unspecified cirrhosis of liver: Secondary | ICD-10-CM | POA: Diagnosis not present

## 2013-02-15 DIAGNOSIS — Z992 Dependence on renal dialysis: Secondary | ICD-10-CM | POA: Diagnosis not present

## 2013-02-15 DIAGNOSIS — N186 End stage renal disease: Secondary | ICD-10-CM | POA: Diagnosis not present

## 2013-02-15 DIAGNOSIS — R160 Hepatomegaly, not elsewhere classified: Secondary | ICD-10-CM | POA: Diagnosis not present

## 2013-02-15 DIAGNOSIS — A4902 Methicillin resistant Staphylococcus aureus infection, unspecified site: Secondary | ICD-10-CM | POA: Diagnosis not present

## 2013-02-15 DIAGNOSIS — B9562 Methicillin resistant Staphylococcus aureus infection as the cause of diseases classified elsewhere: Secondary | ICD-10-CM

## 2013-02-15 DIAGNOSIS — R188 Other ascites: Secondary | ICD-10-CM | POA: Diagnosis not present

## 2013-02-15 DIAGNOSIS — T827XXA Infection and inflammatory reaction due to other cardiac and vascular devices, implants and grafts, initial encounter: Secondary | ICD-10-CM | POA: Diagnosis not present

## 2013-02-15 DIAGNOSIS — R7309 Other abnormal glucose: Secondary | ICD-10-CM | POA: Diagnosis not present

## 2013-02-15 DIAGNOSIS — Z5189 Encounter for other specified aftercare: Secondary | ICD-10-CM | POA: Diagnosis not present

## 2013-02-15 LAB — HEMOGLOBIN A1C
HEMOGLOBIN A1C: 4.2 % (ref <5.7)
MEAN PLASMA GLUCOSE: 74 mg/dL (ref <117)

## 2013-02-15 NOTE — Progress Notes (Signed)
Subjective:    Patient ID: James Wall, male    DOB: [birth year], 48 y.o.   MRN: [id number]  HPI   James Wall is a 48 y.o. 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 He underwent surgery to AVF site and ligation performed and grew MRSA from the AV fistula. He had repeat blood cultures that were negative prior to surgery I had wanted another set of blood cultures post AV fistula surgery to be obtained in 2 have his insertion of a fresh hemodialysis catheter delayed until we had at minimum an additional day between AV fistula surgery and insertion of a new dialysis catheter. Unfortunately this did not occur.  The patient is doing well and did record received a six-week course of vancomycin intravenously.   He does have cirrhosis and ritonavir recently underwent a large-volume paracentesis with [facility] gastroenterology. He states that he has been thought to have possible diabetes now for to check an A1c today for him. He continues to have hemodialysis done at  Dialysis center on [address] They have checked blood cultures yesterday as surveillance cultures as we had wished.   Review of Systems  Constitutional: Negative for fever, chills, diaphoresis, activity change, appetite change, fatigue and unexpected weight change.  HENT: Negative for congestion, rhinorrhea, sinus pressure, sneezing, sore throat and trouble swallowing.   Eyes: Negative for photophobia and visual disturbance.  Respiratory: Negative for cough, chest tightness, shortness of breath, wheezing and stridor.   Cardiovascular: Negative for chest pain, palpitations and leg swelling.  Gastrointestinal: Negative for nausea, vomiting, abdominal pain, diarrhea, constipation, blood in stool, abdominal distention and anal bleeding.  Genitourinary: Negative for dysuria, hematuria, flank pain and  difficulty urinating.  Musculoskeletal: Negative for arthralgias, back pain, gait problem, joint swelling and myalgias.  Skin: Negative for color change, pallor, rash and wound.  Neurological: Negative for dizziness, tremors, weakness and light-headedness.  Hematological: Negative for adenopathy. Does not bruise/bleed easily.  Psychiatric/Behavioral: Negative for behavioral problems, confusion, sleep disturbance, dysphoric mood, decreased concentration and agitation.       Objective:   Physical Exam  Constitutional: He is oriented to person, place, and time. He appears well-developed and well-nourished. No distress.  HENT:  Head: Normocephalic and atraumatic.  Mouth/Throat: No oropharyngeal exudate.  Eyes: Conjunctivae and EOM are normal. No scleral icterus.  Neck: Normal range of motion. Neck supple.  Cardiovascular: Normal rate and regular rhythm.   Pulmonary/Chest: Breath sounds normal. No respiratory distress. He has no wheezes.  Abdominal: He exhibits shifting dullness and ascites. He exhibits no distension and no mass. There is hepatomegaly. There is no tenderness. There is no rebound and no guarding.  Musculoskeletal: He exhibits no tenderness.  Lymphadenopathy:       Right: No inguinal adenopathy present.       Left: No inguinal adenopathy present.  Neurological: He is alert and oriented to person, place, and time. He exhibits normal muscle tone. Coordination normal.  Skin: Skin is warm and dry. He is not diaphoretic. No pallor.     Psychiatric: He has a normal mood and affect. His behavior is normal. Judgment and thought content normal.   Fistula site:   HD catheter site pix: pt pulled back bandage as I was taking the picture:           Assessment & Plan:   MRSA extreme  he associated with AV fistula site and also prior hemodialysis catheter:  --Finished 6 weeks of postoperative IV vancomycin.  --Followup on the surveillance cultures    Hepatomegaly with  ascites: Referred to gastroenterology.  Questionable diabetes mellitus: We'll check an A1c again he He needs a primary care physician. I spent greater than 25 minutes with the patient including greater than 50% of time in face to face counsel of the patient and in coordination of their care.

## 2013-02-16 DIAGNOSIS — D631 Anemia in chronic kidney disease: Secondary | ICD-10-CM | POA: Diagnosis not present

## 2013-02-16 DIAGNOSIS — N2581 Secondary hyperparathyroidism of renal origin: Secondary | ICD-10-CM | POA: Diagnosis not present

## 2013-02-16 DIAGNOSIS — N186 End stage renal disease: Secondary | ICD-10-CM | POA: Diagnosis not present

## 2013-02-16 DIAGNOSIS — Z992 Dependence on renal dialysis: Secondary | ICD-10-CM | POA: Diagnosis not present

## 2013-02-16 DIAGNOSIS — N039 Chronic nephritic syndrome with unspecified morphologic changes: Secondary | ICD-10-CM | POA: Diagnosis not present

## 2013-02-16 DIAGNOSIS — A4102 Sepsis due to Methicillin resistant Staphylococcus aureus: Secondary | ICD-10-CM | POA: Diagnosis not present

## 2013-02-16 DIAGNOSIS — D509 Iron deficiency anemia, unspecified: Secondary | ICD-10-CM | POA: Diagnosis not present

## 2013-02-18 DIAGNOSIS — N2581 Secondary hyperparathyroidism of renal origin: Secondary | ICD-10-CM | POA: Diagnosis not present

## 2013-02-18 DIAGNOSIS — N039 Chronic nephritic syndrome with unspecified morphologic changes: Secondary | ICD-10-CM | POA: Diagnosis not present

## 2013-02-18 DIAGNOSIS — D631 Anemia in chronic kidney disease: Secondary | ICD-10-CM | POA: Diagnosis not present

## 2013-02-18 DIAGNOSIS — Z992 Dependence on renal dialysis: Secondary | ICD-10-CM | POA: Diagnosis not present

## 2013-02-18 DIAGNOSIS — D509 Iron deficiency anemia, unspecified: Secondary | ICD-10-CM | POA: Diagnosis not present

## 2013-02-18 DIAGNOSIS — A4102 Sepsis due to Methicillin resistant Staphylococcus aureus: Secondary | ICD-10-CM | POA: Diagnosis not present

## 2013-02-18 DIAGNOSIS — N186 End stage renal disease: Secondary | ICD-10-CM | POA: Diagnosis not present

## 2013-02-21 ENCOUNTER — Encounter (HOSPITAL_COMMUNITY): Payer: Self-pay | Admitting: *Deleted

## 2013-02-21 DIAGNOSIS — D631 Anemia in chronic kidney disease: Secondary | ICD-10-CM | POA: Diagnosis not present

## 2013-02-21 DIAGNOSIS — N186 End stage renal disease: Secondary | ICD-10-CM | POA: Diagnosis not present

## 2013-02-21 DIAGNOSIS — A4102 Sepsis due to Methicillin resistant Staphylococcus aureus: Secondary | ICD-10-CM | POA: Diagnosis not present

## 2013-02-21 DIAGNOSIS — N2581 Secondary hyperparathyroidism of renal origin: Secondary | ICD-10-CM | POA: Diagnosis not present

## 2013-02-21 DIAGNOSIS — Z992 Dependence on renal dialysis: Secondary | ICD-10-CM | POA: Diagnosis not present

## 2013-02-21 DIAGNOSIS — D509 Iron deficiency anemia, unspecified: Secondary | ICD-10-CM | POA: Diagnosis not present

## 2013-02-21 MED ORDER — CEFUROXIME SODIUM 1.5 G IJ SOLR
1.5000 g | INTRAMUSCULAR | Status: AC
  Administered 2013-02-22: 1.5 g via INTRAVENOUS
  Filled 2013-02-21: qty 1.5

## 2013-02-21 NOTE — Progress Notes (Addendum)
James Wall denies having sleep apnea states that he had it , but does not have it now.  Patient frustrated with interview, "I have never had any one ask me so many questions, I don see why you are now."  I explained that we call patient prior to surgery instead of having them coming in for PAT, since he has to have lab work the day of surgery."  Patient responses very short.  Sleep apnea was done at [facility] in 2010- Results under notes.  Patint has lost 58 pounds since study was done.

## 2013-02-22 ENCOUNTER — Ambulatory Visit (HOSPITAL_COMMUNITY)
Admission: RE | Admit: 2013-02-22 | Discharge: 2013-02-22 | Disposition: A | Discharge status: Home / Self Care | Payer: Medicare Other | Source: Ambulatory Visit | Attending: Vascular Surgery | Admitting: Vascular Surgery

## 2013-02-22 ENCOUNTER — Encounter (HOSPITAL_COMMUNITY): Payer: Medicare Other | Admitting: Anesthesiology

## 2013-02-22 ENCOUNTER — Encounter (HOSPITAL_COMMUNITY): Payer: Self-pay | Admitting: *Deleted

## 2013-02-22 ENCOUNTER — Telehealth: Payer: Self-pay | Admitting: Vascular Surgery

## 2013-02-22 ENCOUNTER — Other Ambulatory Visit: Payer: Self-pay | Admitting: *Deleted

## 2013-02-22 ENCOUNTER — Ambulatory Visit (HOSPITAL_COMMUNITY): Payer: Medicare Other | Admitting: Anesthesiology

## 2013-02-22 ENCOUNTER — Encounter (HOSPITAL_COMMUNITY)
Admission: RE | Disposition: A | Discharge status: Home / Self Care | Payer: Self-pay | Source: Ambulatory Visit | Attending: Vascular Surgery

## 2013-02-22 DIAGNOSIS — N186 End stage renal disease: Secondary | ICD-10-CM

## 2013-02-22 DIAGNOSIS — F17200 Nicotine dependence, unspecified, uncomplicated: Secondary | ICD-10-CM | POA: Insufficient documentation

## 2013-02-22 DIAGNOSIS — N2581 Secondary hyperparathyroidism of renal origin: Secondary | ICD-10-CM | POA: Insufficient documentation

## 2013-02-22 DIAGNOSIS — I12 Hypertensive chronic kidney disease with 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: Secondary | ICD-10-CM | POA: Diagnosis not present

## 2013-02-22 DIAGNOSIS — D649 Anemia, unspecified: Secondary | ICD-10-CM | POA: Insufficient documentation

## 2013-02-22 DIAGNOSIS — J449 Chronic obstructive pulmonary disease, unspecified: Secondary | ICD-10-CM | POA: Diagnosis not present

## 2013-02-22 DIAGNOSIS — K219 Gastro-esophageal reflux disease without esophagitis: Secondary | ICD-10-CM | POA: Diagnosis not present

## 2013-02-22 DIAGNOSIS — Z992 Dependence on renal dialysis: Secondary | ICD-10-CM | POA: Diagnosis not present

## 2013-02-22 DIAGNOSIS — I10 Essential (primary) hypertension: Secondary | ICD-10-CM | POA: Diagnosis not present

## 2013-02-22 DIAGNOSIS — M109 Gout, unspecified: Secondary | ICD-10-CM | POA: Diagnosis not present

## 2013-02-22 DIAGNOSIS — J4489 Other specified chronic obstructive pulmonary disease: Secondary | ICD-10-CM | POA: Insufficient documentation

## 2013-02-22 DIAGNOSIS — Z4931 Encounter for adequacy testing for hemodialysis: Secondary | ICD-10-CM

## 2013-02-22 HISTORY — DX: Sepsis, unspecified organism: A41.9

## 2013-02-22 HISTORY — DX: Shortness of breath: R06.02

## 2013-02-22 HISTORY — DX: Depression, unspecified: F32.A

## 2013-02-22 HISTORY — DX: Anxiety disorder, unspecified: F41.9

## 2013-02-22 HISTORY — PX: AV FISTULA PLACEMENT: SHX1204

## 2013-02-22 HISTORY — DX: Major depressive disorder, single episode, unspecified: F32.9

## 2013-02-22 HISTORY — DX: Gastro-esophageal reflux disease without esophagitis: K21.9

## 2013-02-22 LAB — POCT I-STAT 4, (NA,K, GLUC, HGB,HCT)
GLUCOSE: 84 mg/dL (ref 70–99)
HEMATOCRIT: 35 % — AB (ref 39.0–52.0)
Hemoglobin: 11.9 g/dL — ABNORMAL LOW (ref 13.0–17.0)
POTASSIUM: 5.1 meq/L (ref 3.7–5.3)
Sodium: 139 mEq/L (ref 137–147)

## 2013-02-22 SURGERY — ARTERIOVENOUS (AV) FISTULA CREATION
Anesthesia: General | Site: Arm Upper | Laterality: Right

## 2013-02-22 MED ORDER — PROPOFOL 10 MG/ML IV BOLUS
INTRAVENOUS | Status: DC | PRN
  Administered 2013-02-22: 40 mg via INTRAVENOUS
  Administered 2013-02-22: 160 mg via INTRAVENOUS

## 2013-02-22 MED ORDER — LIDOCAINE HCL (PF) 1 % IJ SOLN
INTRAMUSCULAR | Status: AC
  Filled 2013-02-22: qty 30

## 2013-02-22 MED ORDER — 0.9 % SODIUM CHLORIDE (POUR BTL) OPTIME
TOPICAL | Status: DC | PRN
  Administered 2013-02-22: 1000 mL

## 2013-02-22 MED ORDER — SODIUM CHLORIDE 0.9 % IR SOLN
Status: DC | PRN
  Administered 2013-02-22: 08:00:00

## 2013-02-22 MED ORDER — MIDAZOLAM HCL 5 MG/5ML IJ SOLN
INTRAMUSCULAR | Status: DC | PRN
  Administered 2013-02-22: 2 mg via INTRAVENOUS

## 2013-02-22 MED ORDER — SODIUM CHLORIDE 0.9 % IV SOLN: INTRAVENOUS | Status: DC

## 2013-02-22 MED ORDER — ONDANSETRON HCL 4 MG/2ML IJ SOLN: 4.0000 mg | Freq: Once | INTRAMUSCULAR | Status: DC | PRN

## 2013-02-22 MED ORDER — ACETAMINOPHEN 325 MG PO TABS: 325.0000 mg | ORAL_TABLET | ORAL | Status: DC | PRN

## 2013-02-22 MED ORDER — FENTANYL CITRATE 0.05 MG/ML IJ SOLN
25.0000 ug | INTRAMUSCULAR | Status: DC | PRN
  Administered 2013-02-22 (×4): 25 ug via INTRAVENOUS

## 2013-02-22 MED ORDER — FENTANYL CITRATE 0.05 MG/ML IJ SOLN
INTRAMUSCULAR | Status: AC
  Filled 2013-02-22: qty 2

## 2013-02-22 MED ORDER — OXYCODONE HCL 5 MG/5ML PO SOLN: 5.0000 mg | Freq: Once | ORAL | Status: AC | PRN

## 2013-02-22 MED ORDER — HEPARIN SODIUM (PORCINE) 1000 UNIT/ML IJ SOLN
INTRAMUSCULAR | Status: DC | PRN
  Administered 2013-02-22: 5000 [IU] via INTRAVENOUS

## 2013-02-22 MED ORDER — LIDOCAINE HCL (CARDIAC) 20 MG/ML IV SOLN
INTRAVENOUS | Status: DC | PRN
  Administered 2013-02-22: 80 mg via INTRAVENOUS

## 2013-02-22 MED ORDER — OXYCODONE HCL 5 MG PO TABS
ORAL_TABLET | ORAL | Status: AC
  Filled 2013-02-22: qty 1

## 2013-02-22 MED ORDER — FENTANYL CITRATE 0.05 MG/ML IJ SOLN
INTRAMUSCULAR | Status: DC | PRN
  Administered 2013-02-22 (×3): 25 ug via INTRAVENOUS
  Administered 2013-02-22: 50 ug via INTRAVENOUS
  Administered 2013-02-22: 25 ug via INTRAVENOUS

## 2013-02-22 MED ORDER — ONDANSETRON HCL 4 MG/2ML IJ SOLN
INTRAMUSCULAR | Status: DC | PRN
  Administered 2013-02-22: 4 mg via INTRAVENOUS

## 2013-02-22 MED ORDER — THROMBIN 20000 UNITS EX SOLR
CUTANEOUS | Status: AC
  Filled 2013-02-22: qty 20000

## 2013-02-22 MED ORDER — OXYCODONE HCL 5 MG PO TABS
5.0000 mg | ORAL_TABLET | Freq: Once | ORAL | Status: AC | PRN
  Administered 2013-02-22: 5 mg via ORAL

## 2013-02-22 MED ORDER — SODIUM CHLORIDE 0.9 % IV SOLN
INTRAVENOUS | Status: DC | PRN
  Administered 2013-02-22: 07:00:00 via INTRAVENOUS

## 2013-02-22 MED ORDER — OXYCODONE-ACETAMINOPHEN 5-325 MG PO TABS: 2.0000 | ORAL_TABLET | Freq: Four times a day (QID) | ORAL | Status: DC | PRN

## 2013-02-22 MED ORDER — ACETAMINOPHEN 160 MG/5ML PO SOLN
325.0000 mg | ORAL | Status: DC | PRN
  Filled 2013-02-22: qty 20.3

## 2013-02-22 SURGICAL SUPPLY — 38 items
ADH SKN CLS APL DERMABOND .7 (GAUZE/BANDAGES/DRESSINGS) ×1
ARMBAND PINK RESTRICT EXTREMIT (MISCELLANEOUS) ×2 IMPLANT
CANISTER SUCTION 2500CC (MISCELLANEOUS) ×2 IMPLANT
CLIP TI MEDIUM 6 (CLIP) ×2 IMPLANT
CLIP TI WIDE RED SMALL 6 (CLIP) ×2 IMPLANT
COVER PROBE W GEL 5X96 (DRAPES) ×2 IMPLANT
COVER SURGICAL LIGHT HANDLE (MISCELLANEOUS) ×2 IMPLANT
DECANTER SPIKE VIAL GLASS SM (MISCELLANEOUS) ×2 IMPLANT
DERMABOND ADVANCED (GAUZE/BANDAGES/DRESSINGS) ×1
DERMABOND ADVANCED .7 DNX12 (GAUZE/BANDAGES/DRESSINGS) ×1 IMPLANT
DRAIN PENROSE 1/4X12 LTX STRL (WOUND CARE) ×2 IMPLANT
ELECT REM PT RETURN 9FT ADLT (ELECTROSURGICAL) ×2
ELECTRODE REM PT RTRN 9FT ADLT (ELECTROSURGICAL) ×1 IMPLANT
GEL ULTRASOUND 20GR AQUASONIC (MISCELLANEOUS) IMPLANT
GLOVE BIO SURGEON STRL SZ 6.5 (GLOVE) ×1 IMPLANT
GLOVE BIO SURGEON STRL SZ7.5 (GLOVE) ×2 IMPLANT
GLOVE BIOGEL PI IND STRL 6.5 (GLOVE) IMPLANT
GLOVE BIOGEL PI INDICATOR 6.5 (GLOVE) ×4
GOWN STRL REUS W/ TWL LRG LVL3 (GOWN DISPOSABLE) ×3 IMPLANT
GOWN STRL REUS W/TWL LRG LVL3 (GOWN DISPOSABLE) ×6
KIT BASIN OR (CUSTOM PROCEDURE TRAY) ×2 IMPLANT
KIT ROOM TURNOVER OR (KITS) ×2 IMPLANT
LOOP VESSEL MINI RED (MISCELLANEOUS) IMPLANT
NDL HYPO 25GX1X1/2 BEV (NEEDLE) ×1 IMPLANT
NEEDLE HYPO 25GX1X1/2 BEV (NEEDLE) IMPLANT
NS IRRIG 1000ML POUR BTL (IV SOLUTION) ×2 IMPLANT
PACK CV ACCESS (CUSTOM PROCEDURE TRAY) ×2 IMPLANT
PAD ARMBOARD 7.5X6 YLW CONV (MISCELLANEOUS) ×4 IMPLANT
SLING ARM LRG ADULT FOAM STRAP (SOFTGOODS) ×1 IMPLANT
SPONGE SURGIFOAM ABS GEL 100 (HEMOSTASIS) IMPLANT
SUT PROLENE 7 0 BV 1 (SUTURE) ×2 IMPLANT
SUT VIC AB 3-0 SH 27 (SUTURE) ×2
SUT VIC AB 3-0 SH 27X BRD (SUTURE) ×1 IMPLANT
SUT VICRYL 4-0 PS2 18IN ABS (SUTURE) ×2 IMPLANT
TOWEL OR 17X24 6PK STRL BLUE (TOWEL DISPOSABLE) ×2 IMPLANT
TOWEL OR 17X26 10 PK STRL BLUE (TOWEL DISPOSABLE) ×2 IMPLANT
UNDERPAD 30X30 INCONTINENT (UNDERPADS AND DIAPERS) ×2 IMPLANT
WATER STERILE IRR 1000ML POUR (IV SOLUTION) ×2 IMPLANT

## 2013-02-22 NOTE — H&P (Signed)
  48 year old male returns for followup today. 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 He is currently dialyzing via a catheter on Monday Wednesday Friday. His dialysis center is [facility]. He denies any drainage from the incision.    Past Medical History    Diagnosis  Date    .  Chronic kidney disease     .  Hypertension     .  COPD (chronic obstructive pulmonary disease)     .  Arthritis       GOUT    .  Anemia     .  Secondary hyperparathyroidism     Physical exam:   Filed Vitals:   02/22/13 0553 02/22/13 0555 02/22/13 0643  BP:  213/118 210/111  Temp: 97.8 F (36.6 C)    TempSrc: Oral    Resp: 18    Height: 5' 5"  (1.651 m)    Weight: 169 lb (76.658 kg)    SpO2: 100%        .                           Left upper extremity healing incision still has some numbness in the forearm but is improving 2+ brachial and radial pulse left arm  Right upper extremity: 2+ brachial and radial pulse palpable cephalic vein  Data: Vein mapping from prior visit was reviewed which shows upper arm cephalic vein is 3 mm or more in diameter.  Assessment: Healing left upper extremity, patient wishes to defer placement of a new access until later in January.  Plan: Right brachiocephalic AV fistula 35/32/9924. Risks benefits possible complications procedure details were explained the patient today including but limited to bleeding infection ischemic steal non-maturation of the fistula.  Ruta Hinds, MD  [facility]  Office: [phone]  Pager: [phone]

## 2013-02-22 NOTE — Anesthesia Postprocedure Evaluation (Signed)
  Anesthesia Post-op Note  Patient: James Wall  Procedure(s) Performed: Procedure(s):  CREATION  OF BRACHIAL CEPHALIC FISTULA RIGHT ARM (Right)  Patient Location: PACU  Anesthesia Type:General  Level of Consciousness: awake, alert  and oriented  Airway and Oxygen Therapy: Patient Spontanous Breathing  Post-op Pain: mild  Post-op Assessment: Post-op Vital signs reviewed, Patient's Cardiovascular Status Stable, Respiratory Function Stable, Patent Airway and Pain level controlled  Post-op Vital Signs: Reviewed and stable  Complications: No apparent anesthesia complications

## 2013-02-22 NOTE — Telephone Encounter (Addendum)
Message copied by Gena Fray on Tue Feb 22, 2013 11:20 AM ------      Message from: Mena Goes      Created: Tue Feb 22, 2013  9:12 AM      Regarding: schedule                   ----- Message -----         From: Elam Dutch, MD         Sent: 02/22/2013   9:06 AM           To: [facility] Charge Pool            Right brachial cephalic AVF      No assist            He needs a follow up appt in one month.            Charles ------  02/22/13: patients mother was unable to hear me over the phone. Mailed letter to home address, dpm

## 2013-02-22 NOTE — Anesthesia Procedure Notes (Signed)
Procedure Name: LMA Insertion Date/Time: 02/22/2013 7:35 AM Performed by: Julian Reil Pre-anesthesia Checklist: Patient identified, Emergency Drugs available, Suction available and Patient being monitored Patient Re-evaluated:Patient Re-evaluated prior to inductionOxygen Delivery Method: Circle system utilized Preoxygenation: Pre-oxygenation with 100% oxygen Intubation Type: IV induction Ventilation: Mask ventilation without difficulty LMA: LMA inserted LMA Size: 4.0 Tube type: Oral Number of attempts: 1 Placement Confirmation: positive ETCO2 and breath sounds checked- equal and bilateral Tube secured with: Tape Dental Injury: Teeth and Oropharynx as per pre-operative assessment

## 2013-02-22 NOTE — Transfer of Care (Signed)
Immediate Anesthesia Transfer of Care Note  Patient: James Wall [facility]  Procedure(s) Performed: Procedure(s):  CREATION  OF BRACHIAL CEPHALIC FISTULA RIGHT ARM (Right)  Patient Location: PACU  Anesthesia Type:General  Level of Consciousness: sedated, patient cooperative and responds to stimulation  Airway & Oxygen Therapy: Patient Spontanous Breathing and Patient connected to nasal cannula oxygen  Post-op Assessment: Report given to PACU RN, Post -op Vital signs reviewed and stable and Patient moving all extremities  Post vital signs: Reviewed and stable  Complications: No apparent anesthesia complications

## 2013-02-22 NOTE — Progress Notes (Signed)
Dialysis access report faxed to [facility]

## 2013-02-22 NOTE — Anesthesia Preprocedure Evaluation (Addendum)
Anesthesia Evaluation  Patient identified by MRN, date of birth, ID band Patient awake    Reviewed: Allergy & Precautions, H&P , NPO status , Patient's Chart, lab work & pertinent test results  History of Anesthesia Complications Negative for: history of anesthetic complications  Airway Mallampati: II TM Distance: >3 FB Neck ROM: Full    Dental  (+) Teeth Intact   Pulmonary shortness of breath and with exertion, sleep apnea and Continuous Positive Airway Pressure Ventilation , Current Smoker,    Pulmonary exam normal       Cardiovascular Exercise Tolerance: Poor hypertension, Pt. on medications - angina- Past MI and - DOE Rhythm:Regular Rate:Normal     Neuro/Psych Anxiety Depression negative neurological ROS     GI/Hepatic Neg liver ROS, GERD-  Medicated and Controlled,  Endo/Other  negative endocrine ROS  Renal/GU Dialysis and CRFRenal disease     Musculoskeletal negative musculoskeletal ROS (+)   Abdominal   Peds  Hematology  (+) anemia ,   Anesthesia Other Findings   Reproductive/Obstetrics                          Anesthesia Physical Anesthesia Plan  ASA: III  Anesthesia Plan: General   Post-op Pain Management:    Induction: Intravenous  Airway Management Planned: LMA  Additional Equipment: None  Intra-op Plan:   Post-operative Plan: Extubation in OR  Informed Consent:   Dental advisory given  Plan Discussed with: CRNA and Surgeon  Anesthesia Plan Comments:        Anesthesia Quick Evaluation

## 2013-02-22 NOTE — Preoperative (Signed)
Beta Blockers   Reason not to administer Beta Blockers:Not Applicable 

## 2013-02-22 NOTE — Interval H&P Note (Signed)
History and Physical Interval Note:  02/22/2013 7:29 AM  James Wall  has presented today for surgery, with the diagnosis of  ESRD  The various methods of treatment have been discussed with the patient and family. After consideration of risks, benefits and other options for treatment, the patient has consented to  Procedure(s): ARTERIOVENOUS (AV) FISTULA CREATION BRACHIAL CEPHALIC (Right) as a surgical intervention .  The patient's history has been reviewed, patient examined, no change in status, stable for surgery.  I have reviewed the patient's chart and labs.  Questions were answered to the patient's satisfaction.     [name] E

## 2013-02-22 NOTE — Op Note (Signed)
Procedure: Right Brachial Cephalic AV fistula  Preop: ESRD  Postop: ESRD  Anesthesia: General  Findings: 3.5 mm cephalic vein  Procedure: After obtaining informed consent, the patient was taken to the operating room.  After induction of general anesthesia, the right upper extremity was prepped and draped in usual sterile fashion.  A transverse incision was then made near the antecubital crease the right arm. The incision was carried into the subcutaneous tissues down to level of the cephalic vein. The cephalic vein was approximately 3.5 mm in diameter. It was of good quality. This was dissected free circumferentially and small side branches ligated and divided between silk ties or clips. Next the brachial artery was dissected free in the medial portion of the incision. The artery was  3-4 mm in diameter. The vessel loops were placed proximal and distal to the planned site of arteriotomy. The patient was given 5000 units of intravenous heparin. After appropriate circulation time, the vessel loops were used to control the artery. A longitudinal opening was made in the brachial artery.  The vein was ligated distally with a 2-0 silk tie. The vein was controlled proximally with a fine bulldog clamp. The vein was then swung over to the artery and sewn end of vein to side of artery using a running 7-0 Prolene suture. Just prior to completion of the anastomosis, everything was fore bled back bled and thoroughly flushed. The anastomosis was secured, vessel loops released, and there was a palpable thrill in the fistula immediately. After hemostasis was obtained, the subcutaneous tissues were reapproximated using a running 3-0 Vicryl suture. The skin was then closed with a 4 Vicryl subcuticular stitch. Dermabond was applied to the skin incision.  The patient had a palpable radial pulse at the end of the case.  Ruta Hinds, MD [facility] Office: [phone] Pager:  [phone]

## 2013-02-23 DIAGNOSIS — N2581 Secondary hyperparathyroidism of renal origin: Secondary | ICD-10-CM | POA: Diagnosis not present

## 2013-02-23 DIAGNOSIS — D509 Iron deficiency anemia, unspecified: Secondary | ICD-10-CM | POA: Diagnosis not present

## 2013-02-23 DIAGNOSIS — A4102 Sepsis due to Methicillin resistant Staphylococcus aureus: Secondary | ICD-10-CM | POA: Diagnosis not present

## 2013-02-23 DIAGNOSIS — N186 End stage renal disease: Secondary | ICD-10-CM | POA: Diagnosis not present

## 2013-02-23 DIAGNOSIS — Z992 Dependence on renal dialysis: Secondary | ICD-10-CM | POA: Diagnosis not present

## 2013-02-23 DIAGNOSIS — D631 Anemia in chronic kidney disease: Secondary | ICD-10-CM | POA: Diagnosis not present

## 2013-02-24 ENCOUNTER — Encounter (HOSPITAL_COMMUNITY): Payer: Self-pay | Admitting: Vascular Surgery

## 2013-02-25 DIAGNOSIS — A4102 Sepsis due to Methicillin resistant Staphylococcus aureus: Secondary | ICD-10-CM | POA: Diagnosis not present

## 2013-02-25 DIAGNOSIS — Z992 Dependence on renal dialysis: Secondary | ICD-10-CM | POA: Diagnosis not present

## 2013-02-25 DIAGNOSIS — D509 Iron deficiency anemia, unspecified: Secondary | ICD-10-CM | POA: Diagnosis not present

## 2013-02-25 DIAGNOSIS — N039 Chronic nephritic syndrome with unspecified morphologic changes: Secondary | ICD-10-CM | POA: Diagnosis not present

## 2013-02-25 DIAGNOSIS — N2581 Secondary hyperparathyroidism of renal origin: Secondary | ICD-10-CM | POA: Diagnosis not present

## 2013-02-25 DIAGNOSIS — N186 End stage renal disease: Secondary | ICD-10-CM | POA: Diagnosis not present

## 2013-02-25 DIAGNOSIS — D631 Anemia in chronic kidney disease: Secondary | ICD-10-CM | POA: Diagnosis not present

## 2013-02-25 NOTE — Addendum Note (Signed)
Addendum created 02/25/13 1112 by Laurie Panda, MD   Modules edited: Anesthesia Attestations

## 2013-02-28 DIAGNOSIS — D509 Iron deficiency anemia, unspecified: Secondary | ICD-10-CM | POA: Diagnosis not present

## 2013-02-28 DIAGNOSIS — A4102 Sepsis due to Methicillin resistant Staphylococcus aureus: Secondary | ICD-10-CM | POA: Diagnosis not present

## 2013-02-28 DIAGNOSIS — N186 End stage renal disease: Secondary | ICD-10-CM | POA: Diagnosis not present

## 2013-02-28 DIAGNOSIS — N2581 Secondary hyperparathyroidism of renal origin: Secondary | ICD-10-CM | POA: Diagnosis not present

## 2013-02-28 DIAGNOSIS — D631 Anemia in chronic kidney disease: Secondary | ICD-10-CM | POA: Diagnosis not present

## 2013-02-28 DIAGNOSIS — Z992 Dependence on renal dialysis: Secondary | ICD-10-CM | POA: Diagnosis not present

## 2013-03-02 DIAGNOSIS — A4102 Sepsis due to Methicillin resistant Staphylococcus aureus: Secondary | ICD-10-CM | POA: Diagnosis not present

## 2013-03-02 DIAGNOSIS — D631 Anemia in chronic kidney disease: Secondary | ICD-10-CM | POA: Diagnosis not present

## 2013-03-02 DIAGNOSIS — N2581 Secondary hyperparathyroidism of renal origin: Secondary | ICD-10-CM | POA: Diagnosis not present

## 2013-03-02 DIAGNOSIS — N186 End stage renal disease: Secondary | ICD-10-CM | POA: Diagnosis not present

## 2013-03-02 DIAGNOSIS — Z992 Dependence on renal dialysis: Secondary | ICD-10-CM | POA: Diagnosis not present

## 2013-03-02 DIAGNOSIS — D509 Iron deficiency anemia, unspecified: Secondary | ICD-10-CM | POA: Diagnosis not present

## 2013-03-02 DIAGNOSIS — E1129 Type 2 diabetes mellitus with other diabetic kidney complication: Secondary | ICD-10-CM | POA: Diagnosis not present

## 2013-03-04 DIAGNOSIS — A4102 Sepsis due to Methicillin resistant Staphylococcus aureus: Secondary | ICD-10-CM | POA: Diagnosis not present

## 2013-03-04 DIAGNOSIS — Z992 Dependence on renal dialysis: Secondary | ICD-10-CM | POA: Diagnosis not present

## 2013-03-04 DIAGNOSIS — N186 End stage renal disease: Secondary | ICD-10-CM | POA: Diagnosis not present

## 2013-03-04 DIAGNOSIS — N2581 Secondary hyperparathyroidism of renal origin: Secondary | ICD-10-CM | POA: Diagnosis not present

## 2013-03-04 DIAGNOSIS — D509 Iron deficiency anemia, unspecified: Secondary | ICD-10-CM | POA: Diagnosis not present

## 2013-03-04 DIAGNOSIS — D631 Anemia in chronic kidney disease: Secondary | ICD-10-CM | POA: Diagnosis not present

## 2013-03-05 DIAGNOSIS — N186 End stage renal disease: Secondary | ICD-10-CM | POA: Diagnosis not present

## 2013-03-07 DIAGNOSIS — Z992 Dependence on renal dialysis: Secondary | ICD-10-CM | POA: Diagnosis not present

## 2013-03-07 DIAGNOSIS — N2581 Secondary hyperparathyroidism of renal origin: Secondary | ICD-10-CM | POA: Diagnosis not present

## 2013-03-07 DIAGNOSIS — N039 Chronic nephritic syndrome with unspecified morphologic changes: Secondary | ICD-10-CM | POA: Diagnosis not present

## 2013-03-07 DIAGNOSIS — D631 Anemia in chronic kidney disease: Secondary | ICD-10-CM | POA: Diagnosis not present

## 2013-03-07 DIAGNOSIS — D509 Iron deficiency anemia, unspecified: Secondary | ICD-10-CM | POA: Diagnosis not present

## 2013-03-07 DIAGNOSIS — N186 End stage renal disease: Secondary | ICD-10-CM | POA: Diagnosis not present

## 2013-03-28 ENCOUNTER — Emergency Department (HOSPITAL_COMMUNITY)
Admission: EM | Admit: 2013-03-28 | Discharge: 2013-03-28 | Disposition: A | Discharge status: Home / Self Care | Payer: Medicare Other | Attending: Emergency Medicine | Admitting: Emergency Medicine

## 2013-03-28 ENCOUNTER — Encounter (HOSPITAL_COMMUNITY): Payer: Self-pay | Admitting: Emergency Medicine

## 2013-03-28 ENCOUNTER — Emergency Department (HOSPITAL_COMMUNITY): Payer: Medicare Other

## 2013-03-28 DIAGNOSIS — Z79899 Other long term (current) drug therapy: Secondary | ICD-10-CM | POA: Diagnosis not present

## 2013-03-28 DIAGNOSIS — R5383 Other fatigue: Secondary | ICD-10-CM | POA: Diagnosis not present

## 2013-03-28 DIAGNOSIS — Z8619 Personal history of other infectious and parasitic diseases: Secondary | ICD-10-CM | POA: Insufficient documentation

## 2013-03-28 DIAGNOSIS — R5381 Other malaise: Secondary | ICD-10-CM | POA: Diagnosis not present

## 2013-03-28 DIAGNOSIS — Z8639 Personal history of other endocrine, nutritional and metabolic disease: Secondary | ICD-10-CM | POA: Insufficient documentation

## 2013-03-28 DIAGNOSIS — R42 Dizziness and giddiness: Secondary | ICD-10-CM | POA: Diagnosis not present

## 2013-03-28 DIAGNOSIS — I12 Hypertensive chronic kidney disease with 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: Secondary | ICD-10-CM | POA: Diagnosis not present

## 2013-03-28 DIAGNOSIS — Z992 Dependence on renal dialysis: Secondary | ICD-10-CM | POA: Insufficient documentation

## 2013-03-28 DIAGNOSIS — R188 Other ascites: Secondary | ICD-10-CM | POA: Diagnosis not present

## 2013-03-28 DIAGNOSIS — R109 Unspecified abdominal pain: Secondary | ICD-10-CM

## 2013-03-28 DIAGNOSIS — Z862 Personal history of diseases of the blood and blood-forming organs and certain disorders involving the immune mechanism: Secondary | ICD-10-CM | POA: Diagnosis not present

## 2013-03-28 DIAGNOSIS — R110 Nausea: Secondary | ICD-10-CM | POA: Diagnosis not present

## 2013-03-28 DIAGNOSIS — J441 Chronic obstructive pulmonary disease with (acute) exacerbation: Secondary | ICD-10-CM | POA: Diagnosis not present

## 2013-03-28 DIAGNOSIS — F17200 Nicotine dependence, unspecified, uncomplicated: Secondary | ICD-10-CM | POA: Insufficient documentation

## 2013-03-28 DIAGNOSIS — R002 Palpitations: Secondary | ICD-10-CM | POA: Diagnosis not present

## 2013-03-28 DIAGNOSIS — R112 Nausea with vomiting, unspecified: Secondary | ICD-10-CM | POA: Insufficient documentation

## 2013-03-28 DIAGNOSIS — N186 End stage renal disease: Secondary | ICD-10-CM | POA: Insufficient documentation

## 2013-03-28 DIAGNOSIS — Z8659 Personal history of other mental and behavioral disorders: Secondary | ICD-10-CM | POA: Insufficient documentation

## 2013-03-28 DIAGNOSIS — K219 Gastro-esophageal reflux disease without esophagitis: Secondary | ICD-10-CM | POA: Diagnosis not present

## 2013-03-28 DIAGNOSIS — Z8739 Personal history of other diseases of the musculoskeletal system and connective tissue: Secondary | ICD-10-CM | POA: Insufficient documentation

## 2013-03-28 LAB — BASIC METABOLIC PANEL
BUN: 25 mg/dL — ABNORMAL HIGH (ref 6–23)
CHLORIDE: 95 meq/L — AB (ref 96–112)
CO2: 24 mEq/L (ref 19–32)
Calcium: 9.4 mg/dL (ref 8.4–10.5)
Creatinine, Ser: 8.21 mg/dL — ABNORMAL HIGH (ref 0.50–1.35)
GFR calc Af Amer: 8 mL/min — ABNORMAL LOW (ref >90)
GFR calc non Af Amer: 7 mL/min — ABNORMAL LOW (ref >90)
Glucose, Bld: 101 mg/dL — ABNORMAL HIGH (ref 70–99)
POTASSIUM: 4.4 meq/L (ref 3.7–5.3)
Sodium: 138 mEq/L (ref 137–147)

## 2013-03-28 LAB — BODY FLUID CELL COUNT WITH DIFFERENTIAL
Eos, Fluid: 1 %
LYMPHS FL: 52 %
Monocyte-Macrophage-Serous Fluid: 46 % — ABNORMAL LOW (ref 50–90)
Neutrophil Count, Fluid: 1 % (ref 0–25)
Total Nucleated Cell Count, Fluid: 317 cu mm (ref 0–1000)

## 2013-03-28 LAB — HEPATIC FUNCTION PANEL
ALBUMIN: 3.5 g/dL (ref 3.5–5.2)
ALT: 9 U/L (ref 0–53)
AST: 15 U/L (ref 0–37)
Alkaline Phosphatase: 74 U/L (ref 39–117)
Bilirubin, Direct: <0.2 mg/dL (ref 0.0–0.3)
TOTAL PROTEIN: 8.4 g/dL — AB (ref 6.0–8.3)
Total Bilirubin: 0.4 mg/dL (ref 0.3–1.2)

## 2013-03-28 LAB — ALBUMIN, FLUID (OTHER): Albumin, Fluid: 2.9 g/dL

## 2013-03-28 LAB — I-STAT TROPONIN, ED: Troponin i, poc: 0.03 ng/mL (ref 0.00–0.08)

## 2013-03-28 LAB — CBC
HEMATOCRIT: 37.2 % — AB (ref 39.0–52.0)
HEMOGLOBIN: 12.7 g/dL — AB (ref 13.0–17.0)
MCH: 34 pg (ref 26.0–34.0)
MCHC: 34.1 g/dL (ref 30.0–36.0)
MCV: 99.7 fL (ref 78.0–100.0)
Platelets: 228 10*3/uL (ref 150–400)
RBC: 3.73 MIL/uL — ABNORMAL LOW (ref 4.22–5.81)
RDW: 13.9 % (ref 11.5–15.5)
WBC: 8.9 10*3/uL (ref 4.0–10.5)

## 2013-03-28 LAB — GLUCOSE, PERITONEAL FLUID: GLUCOSE, PERITONEAL FLUID: 95 mg/dL

## 2013-03-28 LAB — LACTATE DEHYDROGENASE, PLEURAL OR PERITONEAL FLUID: LD, Fluid: 140 U/L — ABNORMAL HIGH (ref 3–23)

## 2013-03-28 LAB — PROTEIN, BODY FLUID: TOTAL PROTEIN, FLUID: 5.8 g/dL

## 2013-03-28 MED ORDER — ONDANSETRON HCL 4 MG/2ML IJ SOLN
4.0000 mg | Freq: Once | INTRAMUSCULAR | Status: AC
  Administered 2013-03-28: 4 mg via INTRAVENOUS
  Filled 2013-03-28: qty 2

## 2013-03-28 MED ORDER — FENTANYL CITRATE 0.05 MG/ML IJ SOLN
100.0000 ug | Freq: Once | INTRAMUSCULAR | Status: AC
  Administered 2013-03-28: 100 ug via INTRAVENOUS
  Filled 2013-03-28: qty 2

## 2013-03-28 NOTE — ED Notes (Signed)
Removed 2.7L per [place]

## 2013-03-28 NOTE — ED Provider Notes (Signed)
Results from ascites fluid showed no evidence of infection. Patient states that he feels much better following removal of ascites fluid so it seems likely that his pain was 2 to mechanical accumulation of fluid. I have examined him in his abdomen is soft and nontender. He is felt to be stable for discharge and will followup with his nephrologist.  Results for orders placed during the hospital encounter of 03/28/13  CBC      Result Value Ref Range   WBC 8.9  4.0 - 10.5 K/uL   RBC 3.73 (*) 4.22 - 5.81 MIL/uL   Hemoglobin 12.7 (*) 13.0 - 17.0 g/dL   HCT 37.2 (*) 39.0 - 52.0 %   MCV 99.7  78.0 - 100.0 fL   MCH 34.0  26.0 - 34.0 pg   MCHC 34.1  30.0 - 36.0 g/dL   RDW 13.9  11.5 - 15.5 %   Platelets 228  150 - 400 K/uL  BASIC METABOLIC PANEL      Result Value Ref Range   Sodium 138  137 - 147 mEq/L   Potassium 4.4  3.7 - 5.3 mEq/L   Chloride 95 (*) 96 - 112 mEq/L   CO2 24  19 - 32 mEq/L   Glucose, Bld 101 (*) 70 - 99 mg/dL   BUN 25 (*) 6 - 23 mg/dL   Creatinine, Ser 8.21 (*) 0.50 - 1.35 mg/dL   Calcium 9.4  8.4 - 10.5 mg/dL   GFR calc non Af Amer 7 (*) >90 mL/min   GFR calc Af Amer 8 (*) >90 mL/min  HEPATIC FUNCTION PANEL      Result Value Ref Range   Total Protein 8.4 (*) 6.0 - 8.3 g/dL   Albumin 3.5  3.5 - 5.2 g/dL   AST 15  0 - 37 U/L   ALT 9  0 - 53 U/L   Alkaline Phosphatase 74  39 - 117 U/L   Total Bilirubin 0.4  0.3 - 1.2 mg/dL   Bilirubin, Direct <0.2  0.0 - 0.3 mg/dL   Indirect Bilirubin NOT CALCULATED  0.3 - 0.9 mg/dL  LACTATE DEHYDROGENASE, BODY FLUID      Result Value Ref Range   LD, Fluid 140 (*) 3 - 23 U/L   Fluid Type-FLDH ASCITIC    GLUCOSE, PERITONEAL FLUID      Result Value Ref Range   Glucose, Peritoneal Fluid 95    PROTEIN, BODY FLUID      Result Value Ref Range   Total protein, fluid 5.8     Fluid Type-FTP ASCITIC    ALBUMIN, FLUID      Result Value Ref Range   Albumin, Fluid 2.9     Fluid Type-FALB ASCITIC    BODY FLUID CELL COUNT WITH DIFFERENTIAL       Result Value Ref Range   Fluid Type-FCT ASCITIC     Color, Fluid YELLOW  YELLOW   Appearance, Fluid HAZY (*) CLEAR   WBC, Fluid 317  0 - 1000 cu mm   Neutrophil Count, Fluid 1  0 - 25 %   Lymphs, Fluid 52     Monocyte-Macrophage-Serous Fluid 46 (*) 50 - 90 %   Eos, Fluid 1    I-STAT TROPOININ, ED      Result Value Ref Range   Troponin i, poc 0.03  0.00 - 0.08 ng/mL   Comment 3            US Paracentesis  03/28/2013   CLINICAL DATA:  Abdominal distention. History of ascites. Request diagnostic and therapeutic paracentesis.  EXAM: ULTRASOUND GUIDED PARACENTESIS  COMPARISON:  Previous paracentesis  PROCEDURE: An ultrasound guided paracentesis was thoroughly discussed with the patient and questions answered. The benefits, risks, alternatives and complications were also discussed. The patient understands and wishes to proceed with the procedure. Written consent was obtained.  Ultrasound was performed to localize and mark an adequate pocket of fluid in the right lower quadrant of the abdomen. The area was then prepped and draped in the normal sterile fashion. 1% Lidocaine was used for local anesthesia. Under ultrasound guidance a 19 gauge Yueh catheter was introduced. Paracentesis was performed. The catheter was removed and a dressing applied.  COMPLICATIONS: None immediate  FINDINGS: A total of approximately 2.7 L of clear yellow fluid was removed. A fluid sample was sent for laboratory analysis.  IMPRESSION: Successful ultrasound guided paracentesis yielding 2.7 L of ascites.  Read by: Ascencion Dike PA-C   Electronically Signed   By: Jacqulynn Cadet M.D.   On: 03/28/2013 96:22      Delora Fuel, MD 29/79/89 2119

## 2013-03-28 NOTE — ED Notes (Signed)
Pt just finished dialysis today c/o palpations in his stomach when he was almost finished. Pt felt nauseated, felt like he was going  To have a bowel movement but couldn't. Pt appears SOB is anxious, states "when my heart drops into my chest it hurts".

## 2013-03-28 NOTE — Discharge Instructions (Signed)
Ascites Ascites is a gathering of fluid in the belly (abdomen). This is most often caused by liver disease. It may also be caused by a number of other less common problems. It causes a ballooning out (distension) of the abdomen. CAUSES  Scarring of the liver (cirrhosis) is the most common cause of ascites. Other causes include:  Infection or inflammation in the abdomen.  Cancer in the abdomen.  Heart failure.  Certain forms of kidney failure (nephritic syndrome).  Inflammation of the pancreas.  Clots in the veins of the liver. SYMPTOMS  In the early stages of ascites, you may not have any symptoms. The main symptom of ascites is a sense of abdominal bloating. This is due to the presence of fluid. This may also cause an increase in abdominal or waist size. People with this condition can develop swelling in the legs, and men can develop a swollen scrotum. When there is a lot of fluid, it may be hard to breath. Stretching of the abdomen by fluid can be painful. DIAGNOSIS  Certain features of your medical history, such as a history of liver disease and of an enlarging abdomen, can suggest the presence of ascites. The diagnosis of ascites can be made on physical exam by your caregiver. An abdominal ultrasound examination can confirm that ascites is present, and estimate the amount of fluid. Once ascites is confirmed, it is important to determine its cause. Again, a history of one of the conditions listed in "CAUSES" provides a strong clue. A physical exam is important, and blood and X-ray tests may be needed. During a procedure called paracentesis, a sample of fluid is removed from the abdomen. This can determine certain key features about the fluid, such as whether or not infection or cancer is present. Your caregiver will determine if a paracentesis is necessary. They will describe the procedure to you. PREVENTION  Ascites is a complication of other conditions. Therefore to prevent ascites, you  must seek treatment for any significant health conditions you have. Once ascites is present, careful attention to fluid and salt intake may help prevent it from getting worse. If you have ascites, you should not drink alcohol. PROGNOSIS  The prognosis of ascites depends on the underlying disease. If the disease is reversible, such as with certain infections or with heart failure, then ascites may improve or disappear. When ascites is caused by cirrhosis, then it indicates that the liver disease has worsened, and further evaluation and treatment of the liver disease is needed. If your ascites is caused by cancer, then the success or failure of the cancer treatment will determine whether your ascites will improve or worsen. RISKS AND COMPLICATIONS  Ascites is likely to worsen if it is not properly diagnosed and treated. A large amount of ascites can cause pain and difficulty breathing. The main complication, besides worsening, is infection (called spontaneous bacterial peritonitis). This requires prompt treatment. TREATMENT  The treatment of ascites depends on its cause. When liver disease is your cause, medical management using water pills (diuretics) and decreasing salt intake is often effective. Ascites due to peritoneal inflammation or malignancy (cancer) alone does not respond to salt restriction and diuretics. Hospitalization is sometimes required. If the treatment of ascites cannot be managed with medications, a number of other treatments are available. Your caregivers will help you decide which will work best for you. Some of these are:  Removal of fluid from the abdomen (paracentesis).  Fluid from the abdomen is passed into a vein (peritoneovenous shunting).  Liver transplantation.  Transjugular intrahepatic portosystemic stent shunt. HOME CARE INSTRUCTIONS  It is important to monitor body weight and the intake and output of fluids. Weigh yourself at the same time every day. Record your  weights. Fluid restriction may be necessary. It is also important to know your salt intake. The more salt you take in, the more fluid you will retain. Ninety percent of people with ascites respond to this approach.  Follow any directions for medicines carefully.  Follow up with your caregiver, as directed.  Report any changes in your health, especially any new or worsening symptoms.  If your ascites is from liver disease, avoid alcohol and other substances toxic to the liver. SEEK MEDICAL CARE IF:   Your weight increases more than a few pounds in a few days.  Your abdominal or waist size increases.  You develop swelling in your legs.  You had swelling and it worsens. SEEK IMMEDIATE MEDICAL CARE IF:   You develop a fever.  You develop new abdominal pain.  You develop difficulty breathing.  You develop confusion.  You have bleeding from the mouth, stomach, or rectum. MAKE SURE YOU:   Understand these instructions.  Will watch your condition.  Will get help right away if you are not doing well or get worse. Document Released: 01/20/2005 Document Revised: 04/14/2011 Document Reviewed: 08/21/2006 [facility] Patient Information 2014 [place].

## 2013-03-28 NOTE — Procedures (Signed)
Successful US guided paracentesis from RLQ.  Yielded 2.7L of clear yellow fluid.  No immediate complications.  Pt tolerated well.   Specimen was sent for labs.  Ascencion Dike PA-C 03/28/2013 4:46 PM

## 2013-03-28 NOTE — ED Provider Notes (Signed)
CSN: [id number]     Arrival date & time 03/28/13  1249 History   First MD Initiated Contact with Patient 03/28/13 1321     Chief Complaint  Patient presents with  . Palpitations  . Shortness of Breath  . Abdominal Pain     (Consider location/radiation/quality/duration/timing/severity/associated sxs/prior Treatment) Patient is a 48 y.o. male presenting with palpitations, shortness of breath, and abdominal pain. The history is provided by the patient.  Palpitations Associated symptoms: nausea, shortness of breath and vomiting   Associated symptoms: no back pain, no chest pain and no numbness   Shortness of Breath Associated symptoms: abdominal pain and vomiting   Associated symptoms: no chest pain, no fever, no headaches and no rash   Abdominal Pain Associated symptoms: fatigue, nausea, shortness of breath and vomiting   Associated symptoms: no chest pain, no diarrhea and no fever    patient presents with chest pain going down his abdomen. He states feels as if his heart has fallen down in the stomach is collapsed. He's had some mild nausea. He said a little bit of vomiting 2. He states his been vomiting for months. No fevers. No diarrhea. he was able to do less than his normal amount of dialysis today. States he was feeling bad. He usually does not fill all of his dialysis time.  Past Medical History  Diagnosis Date  . Chronic kidney disease   . Hypertension   . COPD (chronic obstructive pulmonary disease)   . Arthritis     GOUT  . Anemia   . Secondary hyperparathyroidism   . Anxiety   . Depression   . Shortness of breath     With exertion  . GERD (gastroesophageal reflux disease)   . Sepsis 02/2013    from AVF , treated with Vancomycin.   Past Surgical History  Procedure Laterality Date  . Av fistula placement  2009    Left lower arm AVF  . Ligation of arteriovenous  fistula Left 12/22/2012    Procedure: LIGATION OF ARTERIOVENOUS  FISTULA;EXCISION OF LARGE ANEURYSMS;;   Surgeon: Elam Dutch, MD;  Location: [facility];  Service: Vascular;  Laterality: Left;  . Insertion of dialysis catheter N/A 12/23/2012    Procedure: INSERTION OF DIALYSIS CATHETER; ULTRASOUND GUIDED;  Surgeon: Angelia Mould, MD;  Location: [facility];  Service: Vascular;  Laterality: N/A;  . Av fistula placement Right 02/22/2013    Procedure:  CREATION  OF BRACHIAL CEPHALIC FISTULA RIGHT ARM;  Surgeon: Elam Dutch, MD;  Location: [facility];  Service: Vascular;  Laterality: Right;   Family History  Problem Relation Age of Onset  . Stroke Father    History  Substance Use Topics  . Smoking status: Current Every Day Smoker -- 0.50 packs/day for 16 years    Types: Cigarettes  . Smokeless tobacco: Never Used  . Alcohol Use: Yes     Comment: occassional    Review of Systems  Constitutional: Positive for fatigue. Negative for fever, activity change and appetite change.  Eyes: Negative for pain.  Respiratory: Positive for shortness of breath. Negative for chest tightness.   Cardiovascular: Positive for palpitations. Negative for chest pain and leg swelling.  Gastrointestinal: Positive for nausea, vomiting and abdominal pain. Negative for diarrhea.  Genitourinary: Negative for flank pain.  Musculoskeletal: Negative for back pain and neck stiffness.  Skin: Negative for rash.  Neurological: Positive for light-headedness. Negative for weakness, numbness and headaches.  Psychiatric/Behavioral: Negative for behavioral problems.  Allergies  Aspirin  Home Medications   Current Outpatient Rx  Name  Route  Sig  Dispense  Refill  . amLODipine (NORVASC) 10 MG tablet   Oral   Take 10 mg by mouth daily.         . cloNIDine (CATAPRES - DOSED IN MG/24 HR) 0.3 mg/24hr   Transdermal   Place 1 patch onto the skin once a week. No specific day         . esomeprazole (NEXIUM) 40 MG capsule   Oral   Take 40 mg by mouth daily as needed (reflux/indigestion).          Marland Kitchen lisinopril  (PRINIVIL,ZESTRIL) 40 MG tablet   Oral   Take 80 mg by mouth daily.         . sevelamer carbonate (RENVELA) 800 MG tablet   Oral   Take 2,400-5,600 mg by mouth 3 (three) times daily with meals. Takes 3-4 tablets if the meal is light and 6-7 tablets if he eats a big meal.         . ASA-APAP-Caff Buffered (VANQUISH) [phone] MG TABS   Oral   Take 1 tablet by mouth daily as needed (for headaches).           BP 149/95  Pulse 70  Temp(Src) 98.3 F (36.8 C) (Oral)  Resp 16  Ht 5' 6"  (1.676 m)  Wt 169 lb (76.658 kg)  BMI 27.29 kg/m2  SpO2 100% Physical Exam  Nursing note and vitals reviewed. Constitutional: He is oriented to person, place, and time. He appears well-developed and well-nourished.  HENT:  Head: Normocephalic and atraumatic.  Eyes: EOM are normal. Pupils are equal, round, and reactive to light.  Neck: Normal range of motion. Neck supple.  Cardiovascular: Normal rate, regular rhythm and normal heart sounds.   No murmur heard. Pulmonary/Chest: Effort normal and breath sounds normal.  Dialysis catheter left upper chest wall.  Abdominal: Soft. Bowel sounds are normal. He exhibits no distension and no mass. There is tenderness. There is no rebound and no guarding.  Moderate tenderness to lower abdomen. No rebound or guarding. No hernias palpated, however states that he does have episodes were he is bulges in both groins.  Musculoskeletal: Normal range of motion. He exhibits no edema and no tenderness.  Dialysis graft right upper extremity.  Neurological: He is alert and oriented to person, place, and time. No cranial nerve deficit.  Skin: Skin is warm and dry.  Psychiatric: He has a normal mood and affect.    ED Course  Procedures (including critical care time) Labs Review Labs Reviewed  CBC - Abnormal; Notable for the following:    RBC 3.73 (*)    Hemoglobin 12.7 (*)    HCT 37.2 (*)    All other components within normal limits  BASIC METABOLIC PANEL -  Abnormal; Notable for the following:    Chloride 95 (*)    Glucose, Bld 101 (*)    BUN 25 (*)    Creatinine, Ser 8.21 (*)    GFR calc non Af Amer 7 (*)    GFR calc Af Amer 8 (*)    All other components within normal limits  HEPATIC FUNCTION PANEL - Abnormal; Notable for the following:    Total Protein 8.4 (*)    All other components within normal limits  BODY FLUID CULTURE  LACTATE DEHYDROGENASE, BODY FLUID  GLUCOSE, PERITONEAL FLUID  PROTEIN, BODY FLUID  ALBUMIN, FLUID  I-STAT TROPOININ, ED   Imaging  Review Dg Abd Acute W/chest  03/28/2013   CLINICAL DATA:  Nausea.  EXAM: ACUTE ABDOMEN SERIES (ABDOMEN 2 VIEW & CHEST 1 VIEW)  COMPARISON:  Chest in two views abdomen 03/28/2012.  FINDINGS: Left IJ approach dialysis catheter is in place with the tips projecting in good position. Lungs are clear. Heart size is upper normal. No pneumothorax or pleural fluid.  Two views of the abdomen show no free intraperitoneal air. Multiple gas-filled but nondilated loops of small bowel are present. There is no abnormal abdominal calcification or focal bony abnormality.  IMPRESSION: No acute finding in the chest.  Multiple gas-filled but nondilated loops of small bowel could be secondary to enteritis. There is no evidence of bowel obstruction and no free peritoneal air.   Electronically Signed   By: Inge Rise M.D.   On: 03/28/2013 15:15    EKG Interpretation   None      Date: 03/28/2013  Rate: 84  Rhythm: normal sinus rhythm  QRS Axis: normal  Intervals: normal  ST/T Wave abnormalities: nonspecific T wave changes  Conduction Disutrbances:none  Narrative Interpretation:   Old EKG Reviewed: unchanged    MDM   Final diagnoses:  None    Patient with. He is feeling fatigued and chest abdominal pain. No fevers. He was unable to complete his whole dialysis day, however this is not unusual for him. Laboratories overall reassuring. Ultrasound of the done for paracentesis. X-ray does not show  obstruction. Will need reevaluation by Dr. Roxanne Mins. May require admission or possible CT. Has had recent sepsis from infected dialysis line.    Jasper Riling. Alvino Chapel, MD 03/28/13 [phone]

## 2013-03-29 ENCOUNTER — Other Ambulatory Visit: Payer: Self-pay | Admitting: Radiology

## 2013-03-30 ENCOUNTER — Encounter: Payer: Self-pay | Admitting: Vascular Surgery

## 2013-03-30 LAB — PATHOLOGIST SMEAR REVIEW: PATH REVIEW: REACTIVE

## 2013-03-31 ENCOUNTER — Encounter: Payer: Medicare Other | Admitting: Vascular Surgery

## 2013-03-31 ENCOUNTER — Other Ambulatory Visit (HOSPITAL_COMMUNITY): Payer: Medicare Other

## 2013-04-01 LAB — BODY FLUID CULTURE: Culture: NO GROWTH

## 2013-04-02 DIAGNOSIS — N186 End stage renal disease: Secondary | ICD-10-CM | POA: Diagnosis not present

## 2013-04-04 DIAGNOSIS — N186 End stage renal disease: Secondary | ICD-10-CM | POA: Diagnosis not present

## 2013-04-04 DIAGNOSIS — D509 Iron deficiency anemia, unspecified: Secondary | ICD-10-CM | POA: Diagnosis not present

## 2013-04-04 DIAGNOSIS — N2581 Secondary hyperparathyroidism of renal origin: Secondary | ICD-10-CM | POA: Diagnosis not present

## 2013-04-04 DIAGNOSIS — Z992 Dependence on renal dialysis: Secondary | ICD-10-CM | POA: Diagnosis not present

## 2013-04-04 DIAGNOSIS — N039 Chronic nephritic syndrome with unspecified morphologic changes: Secondary | ICD-10-CM | POA: Diagnosis not present

## 2013-04-04 DIAGNOSIS — D631 Anemia in chronic kidney disease: Secondary | ICD-10-CM | POA: Diagnosis not present

## 2013-04-05 DIAGNOSIS — K7460 Unspecified cirrhosis of liver: Secondary | ICD-10-CM | POA: Diagnosis not present

## 2013-04-05 DIAGNOSIS — R112 Nausea with vomiting, unspecified: Secondary | ICD-10-CM | POA: Diagnosis not present

## 2013-04-05 DIAGNOSIS — R188 Other ascites: Secondary | ICD-10-CM | POA: Diagnosis not present

## 2013-04-06 ENCOUNTER — Encounter (HOSPITAL_COMMUNITY): Payer: Self-pay | Admitting: Pharmacy Technician

## 2013-04-06 DIAGNOSIS — N2581 Secondary hyperparathyroidism of renal origin: Secondary | ICD-10-CM | POA: Diagnosis not present

## 2013-04-06 DIAGNOSIS — D509 Iron deficiency anemia, unspecified: Secondary | ICD-10-CM | POA: Diagnosis not present

## 2013-04-06 DIAGNOSIS — N186 End stage renal disease: Secondary | ICD-10-CM | POA: Diagnosis not present

## 2013-04-06 DIAGNOSIS — Z992 Dependence on renal dialysis: Secondary | ICD-10-CM | POA: Diagnosis not present

## 2013-04-06 DIAGNOSIS — D631 Anemia in chronic kidney disease: Secondary | ICD-10-CM | POA: Diagnosis not present

## 2013-04-07 ENCOUNTER — Other Ambulatory Visit: Payer: Self-pay | Admitting: Gastroenterology

## 2013-04-07 NOTE — Addendum Note (Signed)
Addended by: Arta Silence on: 04/07/2013 05:07 PM   Modules accepted: Orders

## 2013-04-08 DIAGNOSIS — N2581 Secondary hyperparathyroidism of renal origin: Secondary | ICD-10-CM | POA: Diagnosis not present

## 2013-04-08 DIAGNOSIS — Z992 Dependence on renal dialysis: Secondary | ICD-10-CM | POA: Diagnosis not present

## 2013-04-08 DIAGNOSIS — D509 Iron deficiency anemia, unspecified: Secondary | ICD-10-CM | POA: Diagnosis not present

## 2013-04-08 DIAGNOSIS — N186 End stage renal disease: Secondary | ICD-10-CM | POA: Diagnosis not present

## 2013-04-08 DIAGNOSIS — N039 Chronic nephritic syndrome with unspecified morphologic changes: Secondary | ICD-10-CM | POA: Diagnosis not present

## 2013-04-08 DIAGNOSIS — D631 Anemia in chronic kidney disease: Secondary | ICD-10-CM | POA: Diagnosis not present

## 2013-04-11 DIAGNOSIS — D509 Iron deficiency anemia, unspecified: Secondary | ICD-10-CM | POA: Diagnosis not present

## 2013-04-11 DIAGNOSIS — N039 Chronic nephritic syndrome with unspecified morphologic changes: Secondary | ICD-10-CM | POA: Diagnosis not present

## 2013-04-11 DIAGNOSIS — Z992 Dependence on renal dialysis: Secondary | ICD-10-CM | POA: Diagnosis not present

## 2013-04-11 DIAGNOSIS — N186 End stage renal disease: Secondary | ICD-10-CM | POA: Diagnosis not present

## 2013-04-11 DIAGNOSIS — D631 Anemia in chronic kidney disease: Secondary | ICD-10-CM | POA: Diagnosis not present

## 2013-04-11 DIAGNOSIS — N2581 Secondary hyperparathyroidism of renal origin: Secondary | ICD-10-CM | POA: Diagnosis not present

## 2013-04-12 ENCOUNTER — Ambulatory Visit (HOSPITAL_COMMUNITY): Payer: Medicare Other | Admitting: Anesthesiology

## 2013-04-12 ENCOUNTER — Encounter (HOSPITAL_COMMUNITY): Payer: Self-pay | Admitting: Anesthesiology

## 2013-04-12 ENCOUNTER — Ambulatory Visit (HOSPITAL_COMMUNITY)
Admission: RE | Admit: 2013-04-12 | Discharge: 2013-04-12 | Disposition: A | Discharge status: Home / Self Care | Payer: Medicare Other | Source: Ambulatory Visit | Attending: Gastroenterology | Admitting: Gastroenterology

## 2013-04-12 ENCOUNTER — Encounter (HOSPITAL_COMMUNITY)
Admission: RE | Disposition: A | Discharge status: Home / Self Care | Payer: Self-pay | Source: Ambulatory Visit | Attending: Gastroenterology

## 2013-04-12 ENCOUNTER — Encounter (HOSPITAL_COMMUNITY): Payer: Medicare Other | Admitting: Anesthesiology

## 2013-04-12 HISTORY — PX: ESOPHAGOGASTRODUODENOSCOPY (EGD) WITH PROPOFOL: SHX5813

## 2013-04-12 SURGERY — ESOPHAGOGASTRODUODENOSCOPY (EGD) WITH PROPOFOL
Anesthesia: Monitor Anesthesia Care

## 2013-04-12 MED ORDER — PROPOFOL 10 MG/ML IV BOLUS
INTRAVENOUS | Status: AC
  Filled 2013-04-12: qty 20

## 2013-04-12 MED ORDER — KETAMINE HCL 10 MG/ML IJ SOLN
INTRAMUSCULAR | Status: AC
  Filled 2013-04-12: qty 1

## 2013-04-12 MED ORDER — FENTANYL CITRATE 0.05 MG/ML IJ SOLN
INTRAMUSCULAR | Status: AC
  Filled 2013-04-12: qty 2

## 2013-04-12 MED ORDER — MIDAZOLAM HCL 2 MG/2ML IJ SOLN
INTRAMUSCULAR | Status: AC
  Filled 2013-04-12: qty 2

## 2013-04-12 SURGICAL SUPPLY — 15 items

## 2013-04-12 NOTE — Anesthesia Preprocedure Evaluation (Deleted)
Anesthesia Evaluation  Patient identified by MRN, date of birth, ID band Patient awake    Reviewed: Allergy & Precautions, H&P , NPO status , Patient's Chart, lab work & pertinent test results  Airway Mallampati: II TM Distance: >3 FB Neck ROM: Full    Dental no notable dental hx.    Pulmonary shortness of breath, asthma , sleep apnea , COPDCurrent Smoker,  breath sounds clear to auscultation  Pulmonary exam normal       Cardiovascular Exercise Tolerance: Good hypertension, Pt. on medications Rhythm:Regular Rate:Normal     Neuro/Psych PSYCHIATRIC DISORDERS Anxiety Depression negative neurological ROS     GI/Hepatic Neg liver ROS, GERD-  Medicated,  Endo/Other  negative endocrine ROS  Renal/GU ESRFRenal disease  negative genitourinary   Musculoskeletal negative musculoskeletal ROS (+)   Abdominal   Peds negative pediatric ROS (+)  Hematology  (+) anemia ,   Anesthesia Other Findings   Reproductive/Obstetrics negative OB ROS                           Anesthesia Physical Anesthesia Plan  ASA: III  Anesthesia Plan: MAC   Post-op Pain Management:    Induction: Intravenous  Airway Management Planned:   Additional Equipment:   Intra-op Plan:   Post-operative Plan:   Informed Consent: I have reviewed the patients History and Physical, chart, labs and discussed the procedure including the risks, benefits and alternatives for the proposed anesthesia with the patient or authorized representative who has indicated his/her understanding and acceptance.   Dental advisory given  Plan Discussed with: CRNA  Anesthesia Plan Comments:         Anesthesia Quick Evaluation

## 2013-04-13 DIAGNOSIS — Z992 Dependence on renal dialysis: Secondary | ICD-10-CM | POA: Diagnosis not present

## 2013-04-13 DIAGNOSIS — N2581 Secondary hyperparathyroidism of renal origin: Secondary | ICD-10-CM | POA: Diagnosis not present

## 2013-04-13 DIAGNOSIS — D509 Iron deficiency anemia, unspecified: Secondary | ICD-10-CM | POA: Diagnosis not present

## 2013-04-13 DIAGNOSIS — D631 Anemia in chronic kidney disease: Secondary | ICD-10-CM | POA: Diagnosis not present

## 2013-04-13 DIAGNOSIS — N186 End stage renal disease: Secondary | ICD-10-CM | POA: Diagnosis not present

## 2013-04-14 ENCOUNTER — Encounter (HOSPITAL_COMMUNITY): Payer: Self-pay | Admitting: Gastroenterology

## 2013-04-15 DIAGNOSIS — Z992 Dependence on renal dialysis: Secondary | ICD-10-CM | POA: Diagnosis not present

## 2013-04-15 DIAGNOSIS — N039 Chronic nephritic syndrome with unspecified morphologic changes: Secondary | ICD-10-CM | POA: Diagnosis not present

## 2013-04-15 DIAGNOSIS — N186 End stage renal disease: Secondary | ICD-10-CM | POA: Diagnosis not present

## 2013-04-15 DIAGNOSIS — D631 Anemia in chronic kidney disease: Secondary | ICD-10-CM | POA: Diagnosis not present

## 2013-04-15 DIAGNOSIS — N2581 Secondary hyperparathyroidism of renal origin: Secondary | ICD-10-CM | POA: Diagnosis not present

## 2013-04-15 DIAGNOSIS — D509 Iron deficiency anemia, unspecified: Secondary | ICD-10-CM | POA: Diagnosis not present

## 2013-04-18 DIAGNOSIS — N186 End stage renal disease: Secondary | ICD-10-CM | POA: Diagnosis not present

## 2013-04-18 DIAGNOSIS — D631 Anemia in chronic kidney disease: Secondary | ICD-10-CM | POA: Diagnosis not present

## 2013-04-18 DIAGNOSIS — N2581 Secondary hyperparathyroidism of renal origin: Secondary | ICD-10-CM | POA: Diagnosis not present

## 2013-04-18 DIAGNOSIS — Z992 Dependence on renal dialysis: Secondary | ICD-10-CM | POA: Diagnosis not present

## 2013-04-18 DIAGNOSIS — D509 Iron deficiency anemia, unspecified: Secondary | ICD-10-CM | POA: Diagnosis not present

## 2013-04-19 ENCOUNTER — Emergency Department (HOSPITAL_COMMUNITY): Payer: Medicare Other

## 2013-04-19 ENCOUNTER — Emergency Department (HOSPITAL_COMMUNITY)
Admission: EM | Admit: 2013-04-19 | Discharge: 2013-04-19 | Disposition: A | Discharge status: Home / Self Care | Payer: Medicare Other | Attending: Emergency Medicine | Admitting: Emergency Medicine

## 2013-04-19 DIAGNOSIS — R109 Unspecified abdominal pain: Secondary | ICD-10-CM | POA: Diagnosis not present

## 2013-04-19 DIAGNOSIS — F329 Major depressive disorder, single episode, unspecified: Secondary | ICD-10-CM | POA: Diagnosis not present

## 2013-04-19 DIAGNOSIS — Z8739 Personal history of other diseases of the musculoskeletal system and connective tissue: Secondary | ICD-10-CM | POA: Diagnosis not present

## 2013-04-19 DIAGNOSIS — Z992 Dependence on renal dialysis: Secondary | ICD-10-CM | POA: Insufficient documentation

## 2013-04-19 DIAGNOSIS — J449 Chronic obstructive pulmonary disease, unspecified: Secondary | ICD-10-CM | POA: Insufficient documentation

## 2013-04-19 DIAGNOSIS — K219 Gastro-esophageal reflux disease without esophagitis: Secondary | ICD-10-CM | POA: Insufficient documentation

## 2013-04-19 DIAGNOSIS — J4489 Other specified chronic obstructive pulmonary disease: Secondary | ICD-10-CM | POA: Insufficient documentation

## 2013-04-19 DIAGNOSIS — Z79899 Other long term (current) drug therapy: Secondary | ICD-10-CM | POA: Insufficient documentation

## 2013-04-19 DIAGNOSIS — F17200 Nicotine dependence, unspecified, uncomplicated: Secondary | ICD-10-CM | POA: Diagnosis not present

## 2013-04-19 DIAGNOSIS — F411 Generalized anxiety disorder: Secondary | ICD-10-CM | POA: Diagnosis not present

## 2013-04-19 DIAGNOSIS — R110 Nausea: Secondary | ICD-10-CM | POA: Insufficient documentation

## 2013-04-19 DIAGNOSIS — I498 Other specified cardiac arrhythmias: Secondary | ICD-10-CM | POA: Insufficient documentation

## 2013-04-19 DIAGNOSIS — I12 Hypertensive chronic kidney disease with 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: Secondary | ICD-10-CM | POA: Insufficient documentation

## 2013-04-19 DIAGNOSIS — I10 Essential (primary) hypertension: Secondary | ICD-10-CM | POA: Diagnosis not present

## 2013-04-19 DIAGNOSIS — Z862 Personal history of diseases of the blood and blood-forming organs and certain disorders involving the immune mechanism: Secondary | ICD-10-CM | POA: Diagnosis not present

## 2013-04-19 DIAGNOSIS — N186 End stage renal disease: Secondary | ICD-10-CM | POA: Diagnosis not present

## 2013-04-19 DIAGNOSIS — E876 Hypokalemia: Secondary | ICD-10-CM

## 2013-04-19 DIAGNOSIS — F3289 Other specified depressive episodes: Secondary | ICD-10-CM | POA: Insufficient documentation

## 2013-04-19 DIAGNOSIS — K828 Other specified diseases of gallbladder: Secondary | ICD-10-CM | POA: Diagnosis not present

## 2013-04-19 DIAGNOSIS — I471 Supraventricular tachycardia: Secondary | ICD-10-CM

## 2013-04-19 DIAGNOSIS — R000 Tachycardia, unspecified: Secondary | ICD-10-CM | POA: Diagnosis not present

## 2013-04-19 LAB — COMPREHENSIVE METABOLIC PANEL
ALK PHOS: 61 U/L (ref 39–117)
ALT: 9 U/L (ref 0–53)
AST: 9 U/L (ref 0–37)
Albumin: 2.1 g/dL — ABNORMAL LOW (ref 3.5–5.2)
BILIRUBIN TOTAL: 0.4 mg/dL (ref 0.3–1.2)
BUN: 27 mg/dL — ABNORMAL HIGH (ref 6–23)
CO2: 16 meq/L — AB (ref 19–32)
Calcium: 6.7 mg/dL — ABNORMAL LOW (ref 8.4–10.5)
Chloride: 111 mEq/L (ref 96–112)
Creatinine, Ser: 7.52 mg/dL — ABNORMAL HIGH (ref 0.50–1.35)
GFR calc Af Amer: 9 mL/min — ABNORMAL LOW (ref >90)
GFR, EST NON AFRICAN AMERICAN: 8 mL/min — AB (ref >90)
Glucose, Bld: 71 mg/dL (ref 70–99)
POTASSIUM: 2.6 meq/L — AB (ref 3.7–5.3)
SODIUM: 146 meq/L (ref 137–147)
Total Protein: 4.9 g/dL — ABNORMAL LOW (ref 6.0–8.3)

## 2013-04-19 LAB — CBC WITH DIFFERENTIAL/PLATELET
BASOS ABS: 0 10*3/uL (ref 0.0–0.1)
Basophils Relative: 0 % (ref 0–1)
Eosinophils Absolute: 0.2 10*3/uL (ref 0.0–0.7)
Eosinophils Relative: 2 % (ref 0–5)
HCT: 25.6 % — ABNORMAL LOW (ref 39.0–52.0)
Hemoglobin: 8.5 g/dL — ABNORMAL LOW (ref 13.0–17.0)
Lymphocytes Relative: 11 % — ABNORMAL LOW (ref 12–46)
Lymphs Abs: 0.9 10*3/uL (ref 0.7–4.0)
MCH: 32.8 pg (ref 26.0–34.0)
MCHC: 33.2 g/dL (ref 30.0–36.0)
MCV: 98.8 fL (ref 78.0–100.0)
Monocytes Absolute: 0.5 10*3/uL (ref 0.1–1.0)
Monocytes Relative: 6 % (ref 3–12)
Neutro Abs: 6.4 10*3/uL (ref 1.7–7.7)
Neutrophils Relative %: 80 % — ABNORMAL HIGH (ref 43–77)
PLATELETS: 181 10*3/uL (ref 150–400)
RBC: 2.59 MIL/uL — AB (ref 4.22–5.81)
RDW: 13.3 % (ref 11.5–15.5)
WBC: 8 10*3/uL (ref 4.0–10.5)

## 2013-04-19 LAB — TROPONIN I: Troponin I: <0.3 ng/mL (ref <0.30)

## 2013-04-19 LAB — LIPASE, BLOOD: Lipase: 11 U/L (ref 11–59)

## 2013-04-19 MED ORDER — ADENOSINE 6 MG/2ML IV SOLN: 6.0000 mg | Freq: Once | INTRAVENOUS | Status: DC

## 2013-04-19 MED ORDER — ADENOSINE 6 MG/2ML IV SOLN
6.0000 mg | Freq: Once | INTRAVENOUS | Status: DC
  Filled 2013-04-19: qty 2

## 2013-04-19 MED ORDER — ADENOSINE 6 MG/2ML IV SOLN
6.0000 mg | Freq: Once | INTRAVENOUS | Status: AC
  Administered 2013-04-19: 6 mg via INTRAVENOUS

## 2013-04-19 MED ORDER — POTASSIUM CHLORIDE CRYS ER 20 MEQ PO TBCR
40.0000 meq | EXTENDED_RELEASE_TABLET | Freq: Once | ORAL | Status: AC
  Administered 2013-04-19: 40 meq via ORAL
  Filled 2013-04-19: qty 2

## 2013-04-19 MED ORDER — METOPROLOL TARTRATE 1 MG/ML IV SOLN
5.0000 mg | Freq: Once | INTRAVENOUS | Status: AC
  Administered 2013-04-19: 5 mg via INTRAVENOUS

## 2013-04-19 MED ORDER — METOPROLOL TARTRATE 25 MG PO TABS: 100.0000 mg | ORAL_TABLET | Freq: Two times a day (BID) | ORAL | Status: DC

## 2013-04-19 MED ORDER — MORPHINE SULFATE 4 MG/ML IJ SOLN
6.0000 mg | Freq: Once | INTRAMUSCULAR | Status: AC
  Administered 2013-04-19: 6 mg via INTRAVENOUS
  Filled 2013-04-19: qty 2

## 2013-04-19 MED ORDER — IOHEXOL 300 MG/ML  SOLN
25.0000 mL | INTRAMUSCULAR | Status: AC
  Administered 2013-04-19: 25 mL via ORAL

## 2013-04-19 MED ORDER — ONDANSETRON HCL 4 MG/2ML IJ SOLN
4.0000 mg | Freq: Once | INTRAMUSCULAR | Status: AC
  Administered 2013-04-19: 4 mg via INTRAVENOUS
  Filled 2013-04-19: qty 2

## 2013-04-19 MED ORDER — SODIUM CHLORIDE 0.9 % IV BOLUS (SEPSIS)
500.0000 mL | Freq: Once | INTRAVENOUS | Status: AC
Start: 2013-04-19 — End: 2013-04-19
  Administered 2013-04-19: 500 mL via INTRAVENOUS

## 2013-04-19 MED ORDER — METOPROLOL TARTRATE 1 MG/ML IV SOLN
5.0000 mg | Freq: Once | INTRAVENOUS | Status: AC
  Filled 2013-04-19: qty 5

## 2013-04-19 MED ORDER — ADENOSINE 6 MG/2ML IV SOLN
12.0000 mg | Freq: Once | INTRAVENOUS | Status: AC
  Administered 2013-04-19: 12 mg via INTRAVENOUS

## 2013-04-19 MED ORDER — LORAZEPAM 2 MG/ML IJ SOLN
1.0000 mg | Freq: Once | INTRAMUSCULAR | Status: AC
  Administered 2013-04-19: 1 mg via INTRAVENOUS
  Filled 2013-04-19: qty 1

## 2013-04-19 MED ORDER — METOPROLOL TARTRATE 25 MG PO TABS
25.0000 mg | ORAL_TABLET | Freq: Once | ORAL | Status: AC
  Administered 2013-04-19: 25 mg via ORAL
  Filled 2013-04-19: qty 1

## 2013-04-19 NOTE — ED Provider Notes (Signed)
CSN: [id number]     Arrival date & time 04/19/13  1093 History   First MD Initiated Contact with Patient 04/19/13 [phone]     Chief Complaint  Patient presents with  . Abdominal Pain  . Tachycardia     HPI Patient reports nausea lower abdominal pain of the past several weeks. He states this is a recurrent issue. He currently has some nausea. He had a dialysis treatment yesterday. He reports some nausea and shortness of breath after dialysis yesterday. Patient reports a complaint of intermittent palpitations. Currently has palpitations as well. His heart rate is noted to be 185 and in SVT at this time. He denies chest pain or shortness of breath actively. No fevers or chills. No other complaints. He denies diarrhea. No hematemesis. He states that his abdominal pain is a recurrent issue   Past Medical History  Diagnosis Date  . Chronic kidney disease   . Hypertension   . COPD (chronic obstructive pulmonary disease)   . Arthritis     GOUT  . Anemia   . Secondary hyperparathyroidism   . Anxiety   . Depression   . Shortness of breath     With exertion  . GERD (gastroesophageal reflux disease)   . Sepsis 02/2013    from AVF , treated with Vancomycin.   Past Surgical History  Procedure Laterality Date  . Av fistula placement  2009    Left lower arm AVF  . Ligation of arteriovenous  fistula Left 12/22/2012    Procedure: LIGATION OF ARTERIOVENOUS  FISTULA;EXCISION OF LARGE ANEURYSMS;;  Surgeon: Elam Dutch, MD;  Location: [facility];  Service: Vascular;  Laterality: Left;  . Insertion of dialysis catheter N/A 12/23/2012    Procedure: INSERTION OF DIALYSIS CATHETER; ULTRASOUND GUIDED;  Surgeon: Angelia Mould, MD;  Location: [facility];  Service: Vascular;  Laterality: N/A;  . Av fistula placement Right 02/22/2013    Procedure:  CREATION  OF BRACHIAL CEPHALIC FISTULA RIGHT ARM;  Surgeon: Elam Dutch, MD;  Location: [facility];  Service: Vascular;  Laterality: Right;  .  Esophagogastroduodenoscopy (egd) with propofol N/A 04/12/2013    Procedure: ESOPHAGOGASTRODUODENOSCOPY (EGD) WITH PROPOFOL;  Surgeon: Arta Silence, MD;  Location: [facility];  Service: Endoscopy;  Laterality: N/A;   Family History  Problem Relation Age of Onset  . Stroke Father    History  Substance Use Topics  . Smoking status: Current Every Day Smoker -- 0.50 packs/day for 16 years    Types: Cigarettes  . Smokeless tobacco: Never Used  . Alcohol Use: Yes     Comment: occassional    Review of Systems  All other systems reviewed and are negative.      Allergies  Aspirin  Home Medications   Current Outpatient Rx  Name  Route  Sig  Dispense  Refill  . ALPRAZolam (XANAX) 0.5 MG tablet   Oral   Take 0.5-1 mg by mouth 3 (three) times daily as needed for anxiety.         Marland Kitchen amLODipine (NORVASC) 10 MG tablet   Oral   Take 10 mg by mouth at bedtime.          . cloNIDine (CATAPRES - DOSED IN MG/24 HR) 0.3 mg/24hr   Transdermal   Place 1 patch onto the skin once a week. Saturday.         . esomeprazole (NEXIUM) 40 MG capsule   Oral   Take 40-80 mg by mouth daily as needed (reflux/indigestion. Dose varies  depending on severity.).          Marland Kitchen lisinopril (PRINIVIL,ZESTRIL) 40 MG tablet   Oral   Take 80 mg by mouth at bedtime.          . sevelamer carbonate (RENVELA) 800 MG tablet   Oral   Take 2,400-5,600 mg by mouth 3 (three) times daily with meals. Takes 3-4 tablets if the meal is light and 6-7 tablets if he eats a big meal.         . metoprolol (LOPRESSOR) 25 MG tablet   Oral   Take 4 tablets (100 mg total) by mouth 2 (two) times daily.   30 tablet   0    BP 174/93  Pulse 84  Temp(Src) 98.4 F (36.9 C) (Oral)  Resp 21  SpO2 98% Physical Exam  Nursing note and vitals reviewed. Constitutional: He is oriented to person, place, and time. He appears well-developed and well-nourished.  HENT:  Head: Normocephalic and atraumatic.  Eyes: EOM are  normal.  Neck: Normal range of motion.  Cardiovascular: Regular rhythm and intact distal pulses.   Tachycardia.  Pulmonary/Chest: Effort normal and breath sounds normal. No respiratory distress.  Abdominal: Soft. He exhibits no distension. There is no tenderness.  Musculoskeletal: Normal range of motion.  Neurological: He is alert and oriented to person, place, and time.  Skin: Skin is warm and dry.  Psychiatric: He has a normal mood and affect. Judgment normal.    ED Course  CARDIOVERSION Date/Time: 04/23/2013 9:00 AM Performed by: Hoy Morn Authorized by: Hoy Morn Consent: Verbal consent obtained. Risks and benefits: risks, benefits and alternatives were discussed Consent given by: patient Required items: required blood products, implants, devices, and special equipment available Patient identity confirmed: verbally with patient Patient sedated: no Cardioversion basis: elective Pre-procedure rhythm: supraventricular tachycardia Cardioversion mode attempt one: chemical cardioversion with adenosine. Attempt 1 outcome: conversion to normal sinus rhythm (followed by return to SVT) Cardioversion mode attempt two: Chemical cardioversion with adensoine. Attempt 2 outcome: conversion to normal sinus rhythm Post-procedure rhythm: normal sinus rhythm Complications: no complications Patient tolerance: Patient tolerated the procedure well with no immediate complications.   (including critical care time) Labs Review Labs Reviewed  CBC WITH DIFFERENTIAL - Abnormal; Notable for the following:    RBC 2.59 (*)    Hemoglobin 8.5 (*)    HCT 25.6 (*)    Neutrophils Relative % 80 (*)    Lymphocytes Relative 11 (*)    All other components within normal limits  COMPREHENSIVE METABOLIC PANEL - Abnormal; Notable for the following:    Potassium 2.6 (*)    CO2 16 (*)    BUN 27 (*)    Creatinine, Ser 7.52 (*)    Calcium 6.7 (*)    Total Protein 4.9 (*)    Albumin 2.1 (*)    GFR  calc non Af Amer 8 (*)    GFR calc Af Amer 9 (*)    All other components within normal limits  TROPONIN I  LIPASE, BLOOD   Imaging Review CLINICAL DATA: Periumbilical pain and nausea and vomiting for 2  weeks.  EXAM:  CT ABDOMEN AND PELVIS WITHOUT CONTRAST  TECHNIQUE:  Multidetector CT imaging of the abdomen and pelvis was performed  following the standard protocol without intravenous contrast.  COMPARISON: US PARACENTESIS dated 03/28/2013  FINDINGS:  The liver exhibits normal density for the noncontrast study. There  is considerable ascites surrounding the liver as well as the normal  sized spleen.  The stomach is only partially distended with contrast.  The pancreas exhibits no focal mass or ductal dilation. The  gallbladder is distended, but no calcified stones are demonstrated.  There are no adrenal masses. The kidneys are small and exhibit  subcentimeter hypo and hyperdense cortical foci. The caliber of the  abdominal aorta is normal. There is no periaortic or pericaval  lymphadenopathy.  The partially contrast filled loops of small and large bowel exhibit  no evidence of obstruction.There are loops of small bowel that are  minimally distended with gas and contrast in the mid and lower  abdomen. The appendix is normal in appearance. There is a moderate  amount of ascites within the pelvis. The urinary bladder and  prostate gland appear normal. There is no inguinal or significant  umbilical hernia.  The lumbar vertebral bodies are preserved in height. The bony pelvis  exhibits no acute abnormality. The lung bases exhibit emphysematous  changes and compressive atelectasis. The cardiopericardial  silhouette is enlarged.  IMPRESSION:  1. There is no evidence of bowel obstruction or significant ileus.  Minimal distention of small-bowel loops with fluid and gas is  present and may be related to the patient's symptoms.  2. There is a large amount of ascites present presumably  related  underlying hepatic dysfunction. There is enlargement of the cardiac  chambers which may reflect CHF.  3. The gallbladder is mildly distended without objective evidence of  stones or acute inflammation. Gallbladder ultrasound may be useful.  4. The kidneys are small bilaterally without evidence of  obstruction. There are likely hypo and hyperdense cortical cysts  present.  Electronically Signed  By: David Martinique  On: 04/19/2013 13:29    EKG Interpretation   Date/Time:  Tuesday April 19 2013 11:13:42 EDT Ventricular Rate:  87 PR Interval:  185 QRS Duration: 93 QT Interval:  397 QTC Calculation: 478 R Axis:   -33 Text Interpretation:  Sinus rhythm Probable left atrial enlargement Left  axis deviation Nonspecific T abnrm, anterolateral leads Borderline  prolonged QT interval ED PHYSICIAN INTERPRETATION AVAILABLE IN [facility] Confirmed by TEST, Record ([id number]) on 04/21/2013 11:01:14 AM       ECG interpretation  Date: 04/19/2013 0955  Rate: 179  Rhythm: SVT  QRS Axis: normal  Intervals: normal  ST/T Wave abnormalities: normal  Conduction Disutrbances: none  Narrative Interpretation:   Old EKG Reviewed: No significant changes noted  ECG interpretation  Date: 04/19/2013  Rate: 87  Rhythm: normal sinus rhythm  QRS Axis: normal  Intervals: normal  ST/T Wave abnormalities: normal  Conduction Disutrbances: none  Narrative Interpretation:   Old EKG Reviewed: changed, NSR now. SVT resolved       MDM   Final diagnoses:  SVT (supraventricular tachycardia)  Hypokalemia    The patient's abdominal pain seems to be a recurrent issue here. No vomiting. He feels much better. Patient does have hypokalemia. This could causes as the GERD some cardiac instability. Patient has been given potassium here. I discussed his case with nephrology (Dr. Moshe Cipro) who recommends no additional potassium at this time and instead they will change his dialysis and potassium  bath tomorrow to dialysis. Labs will be rechecked tomorrow. Patient remains in normal sinus rhythm. Outpatient cardiology followup. Patient will be started on Lopressor.    Hoy Morn, MD 04/23/13 [phone]

## 2013-04-19 NOTE — Discharge Instructions (Signed)
Supraventricular Tachycardia °Supraventricular tachycardia (SVT) is an abnormal heart rhythm (arrhythmia) that causes the heart to beat very fast (tachycardia). This kind of fast heartbeat originates in the upper chambers of the heart (atria). SVT can cause the heart to beat greater than 100 beats per minute. SVT can have a rapid burst of heartbeats. This can start and stop suddenly without warning and is called nonsustained. SVT can also be sustained, in which the heart beats at a continuous fast rate.  °CAUSES  °There can be different causes of SVT. Some of these include: °· Heart valve problems such as mitral valve prolapse. °· An enlarged heart (hypertrophic cardiomyopathy). °· Congenital heart problems. °· Heart inflammation (pericarditis). °· Hyperthyroidism. °· Low potassium or magnesium levels. °· Caffeine. °· Drug use such as cocaine, methamphetamines, or stimulants. °· Some over-the-counter medicines such as: °· Decongestants. °· Diet medicines. °· Herbal medicines. °SYMPTOMS  °Symptoms of SVT can vary. Symptoms depend on whether the SVT is sustained or nonsustained. You may experience: °· No symptoms (asymptomatic). °· An awareness of your heart beating rapidly (palpitations). °· Shortness of breath. °· Chest pain or pressure. °If your blood pressure drops because of the SVT, you may experience: °· Fainting or near fainting. °· Weakness. °· Dizziness. °DIAGNOSIS  °Different tests can be performed to diagnose SVT, such as: °· An electrocardiogram (EKG). This is a painless test that records the electrical activity of your heart. °· Holter monitor. This is a 24 hour recording of your heart rhythm. You will be given a diary. Write down all symptoms that you have and what you were doing at the time you experienced symptoms. °· Arrhythmia monitor. This is a small device that your wear for several weeks. It records the heart rhythm when you have symptoms. °· Echocardiogram. This is an imaging test to help detect  abnormal heart structure such as congenital abnormalities, heart valve problems, or heart enlargement. °· Stress test. This test can help determine if the SVT is related to exercise. °· Electrophysiology study (EPS). This is a procedure that evaluates your heart's electrical system and can help your caregiver find the cause of your SVT. °TREATMENT  °Treatment of SVT depends on the symptoms, how often it recurs, and whether there are any underlying heart problems.  °· If symptoms are rare and no other cardiac disease is present, no treatment may be needed. °· Blood work may be done to check potassium, magnesium, and thyroid hormone levels to see if they are abnormal. If these levels are abnormal, treatment to correct the problems will occur. °Medicines °Your caregiver may use oral medicines to treat SVT. These medicines are given for long-term control of SVT. Medicines may be used alone or in combination with other treatments. These medicines work to slow nerve impulses in the heart muscle. These medicines can also be used to treat high blood pressure. Some of these medicines may include: °· Calcium channel blockers. °· Beta blockers. °· Digoxin. °Nonsurgical procedures °Nonsurgical techniques may be used if oral medicines do not work. Some examples include: °· Cardioversion. This technique uses either drugs or an electrical shock to restore a normal heart rhythm. °· Cardioversion drugs may be given through an intravenous (IV) line to help "reset" the heart rhythm. °· In electrical cardioversion, the caregiver shocks your heart to stop its beat for a split second. This helps to reset the heart to a normal rhythm. °· Ablation. This procedure is done under mild sedation. High frequency radio wave energy is used to   destroy the area of heart tissue responsible for the SVT. °HOME CARE INSTRUCTIONS  °· Do not smoke. °· Only take medicines prescribed by your caregiver. Check with your caregiver before using over-the-counter  medicines. °· Check with your caregiver about how much alcohol and caffeine (coffee, tea, colas, or chocolate) you may have. °· It is very important to keep all follow-up referrals and appointments in order to properly manage this problem. °SEEK IMMEDIATE MEDICAL CARE IF: °· You have dizziness. °· You faint or nearly faint. °· You have shortness of breath. °· You have chest pain or pressure. °· You have sudden nausea or vomiting. °· You have profuse sweating. °· You are concerned about how long your symptoms last. °· You are concerned about the frequency of your SVT episodes. °If you have the above symptoms, call your local emergency services (911 in [place] immediately. Do not drive yourself to the hospital. °MAKE SURE YOU:  °· Understand these instructions. °· Will watch your condition. °· Will get help right away if you are not doing well or get worse. °Document Released: 01/20/2005 Document Revised: 04/14/2011 Document Reviewed: 05/04/2008 °[employer]® Patient Information ©2014 [employer]. ° °

## 2013-04-19 NOTE — ED Notes (Signed)
Pt with O2 sats 86-92% on RA while sleeping. Placed on 2L [place] with improvement in sats >92%

## 2013-04-19 NOTE — ED Notes (Signed)
Pt discharged.Vital signs exceptable and GCS 15

## 2013-04-19 NOTE — ED Notes (Signed)
Pt arrives c/o lower abdominal pain for about the last 3 weeks. Reports nausea, SOB and increasing pain after dialysis yesterday. Pt with central line catheter uncovered hanging out of left chest. Pt awake, alert, moderate distress noted.

## 2013-04-20 DIAGNOSIS — N2581 Secondary hyperparathyroidism of renal origin: Secondary | ICD-10-CM | POA: Diagnosis not present

## 2013-04-20 DIAGNOSIS — D509 Iron deficiency anemia, unspecified: Secondary | ICD-10-CM | POA: Diagnosis not present

## 2013-04-20 DIAGNOSIS — N186 End stage renal disease: Secondary | ICD-10-CM | POA: Diagnosis not present

## 2013-04-20 DIAGNOSIS — D631 Anemia in chronic kidney disease: Secondary | ICD-10-CM | POA: Diagnosis not present

## 2013-04-20 DIAGNOSIS — Z992 Dependence on renal dialysis: Secondary | ICD-10-CM | POA: Diagnosis not present

## 2013-04-21 DIAGNOSIS — R066 Hiccough: Secondary | ICD-10-CM | POA: Diagnosis not present

## 2013-04-21 DIAGNOSIS — I10 Essential (primary) hypertension: Secondary | ICD-10-CM | POA: Diagnosis not present

## 2013-04-21 DIAGNOSIS — I129 Hypertensive chronic kidney disease with stage 1 through stage 4 chronic kidney disease, or unspecified chronic kidney disease: Secondary | ICD-10-CM | POA: Diagnosis not present

## 2013-04-21 DIAGNOSIS — N186 End stage renal disease: Secondary | ICD-10-CM | POA: Diagnosis not present

## 2013-04-21 DIAGNOSIS — Z7189 Other specified counseling: Secondary | ICD-10-CM | POA: Diagnosis not present

## 2013-04-21 DIAGNOSIS — I517 Cardiomegaly: Secondary | ICD-10-CM | POA: Diagnosis not present

## 2013-04-22 DIAGNOSIS — D509 Iron deficiency anemia, unspecified: Secondary | ICD-10-CM | POA: Diagnosis not present

## 2013-04-22 DIAGNOSIS — N186 End stage renal disease: Secondary | ICD-10-CM | POA: Diagnosis not present

## 2013-04-22 DIAGNOSIS — N2581 Secondary hyperparathyroidism of renal origin: Secondary | ICD-10-CM | POA: Diagnosis not present

## 2013-04-22 DIAGNOSIS — Z992 Dependence on renal dialysis: Secondary | ICD-10-CM | POA: Diagnosis not present

## 2013-04-22 DIAGNOSIS — D631 Anemia in chronic kidney disease: Secondary | ICD-10-CM | POA: Diagnosis not present

## 2013-04-25 DIAGNOSIS — D509 Iron deficiency anemia, unspecified: Secondary | ICD-10-CM | POA: Diagnosis not present

## 2013-04-25 DIAGNOSIS — N039 Chronic nephritic syndrome with unspecified morphologic changes: Secondary | ICD-10-CM | POA: Diagnosis not present

## 2013-04-25 DIAGNOSIS — D631 Anemia in chronic kidney disease: Secondary | ICD-10-CM | POA: Diagnosis not present

## 2013-04-25 DIAGNOSIS — N186 End stage renal disease: Secondary | ICD-10-CM | POA: Diagnosis not present

## 2013-04-25 DIAGNOSIS — N2581 Secondary hyperparathyroidism of renal origin: Secondary | ICD-10-CM | POA: Diagnosis not present

## 2013-04-25 DIAGNOSIS — Z992 Dependence on renal dialysis: Secondary | ICD-10-CM | POA: Diagnosis not present

## 2013-04-27 ENCOUNTER — Encounter: Payer: Self-pay | Admitting: Vascular Surgery

## 2013-04-27 DIAGNOSIS — Z992 Dependence on renal dialysis: Secondary | ICD-10-CM | POA: Diagnosis not present

## 2013-04-27 DIAGNOSIS — D509 Iron deficiency anemia, unspecified: Secondary | ICD-10-CM | POA: Diagnosis not present

## 2013-04-27 DIAGNOSIS — N186 End stage renal disease: Secondary | ICD-10-CM | POA: Diagnosis not present

## 2013-04-27 DIAGNOSIS — D631 Anemia in chronic kidney disease: Secondary | ICD-10-CM | POA: Diagnosis not present

## 2013-04-27 DIAGNOSIS — N2581 Secondary hyperparathyroidism of renal origin: Secondary | ICD-10-CM | POA: Diagnosis not present

## 2013-04-28 ENCOUNTER — Ambulatory Visit (HOSPITAL_COMMUNITY)
Admission: RE | Admit: 2013-04-28 | Discharge: 2013-04-28 | Disposition: A | Discharge status: Home / Self Care | Payer: Medicare Other | Source: Ambulatory Visit | Attending: Vascular Surgery | Admitting: Vascular Surgery

## 2013-04-28 ENCOUNTER — Ambulatory Visit (INDEPENDENT_AMBULATORY_CARE_PROVIDER_SITE_OTHER): Payer: Self-pay | Admitting: Vascular Surgery

## 2013-04-28 ENCOUNTER — Encounter: Payer: Self-pay | Admitting: Vascular Surgery

## 2013-04-28 VITALS — BP 193/103 | HR 125 | Resp 18 | Ht 66.0 in | Wt 163.0 lb

## 2013-04-28 DIAGNOSIS — N186 End stage renal disease: Secondary | ICD-10-CM

## 2013-04-28 DIAGNOSIS — Z4931 Encounter for adequacy testing for hemodialysis: Secondary | ICD-10-CM | POA: Diagnosis not present

## 2013-04-28 MED ORDER — PREGABALIN 25 MG PO CAPS: 25.0000 mg | ORAL_CAPSULE | Freq: Every day | ORAL | Status: DC

## 2013-04-28 NOTE — Progress Notes (Signed)
Patient is a 48 year old male who returns for followup today after placement of a right brachiocephalic AV fistula. This was placed in her 20th 2015. 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 He states that he still has occasional stinging burning needle-type sensation pains in his left upper extremity. He has no problems in the right upper extremity. He is currently dialyzing via left side catheter.  Past Medical History  Diagnosis Date  . Chronic kidney disease   . Hypertension   . COPD (chronic obstructive pulmonary disease)   . Arthritis     GOUT  . Anemia   . Secondary hyperparathyroidism   . Anxiety   . Depression   . Shortness of breath     With exertion  . GERD (gastroesophageal reflux disease)   . Sepsis 02/2013    from AVF , treated with Vancomycin.   Current Outpatient Prescriptions on File Prior to Visit  Medication Sig Dispense Refill  . ALPRAZolam (XANAX) 0.5 MG tablet Take 0.5-1 mg by mouth 3 (three) times daily as needed for anxiety.      Marland Kitchen amLODipine (NORVASC) 10 MG tablet Take 10 mg by mouth at bedtime.       . cloNIDine (CATAPRES - DOSED IN MG/24 HR) 0.3 mg/24hr Place 1 patch onto the skin once a week. Saturday.      . esomeprazole (NEXIUM) 40 MG capsule Take 40-80 mg by mouth daily as needed (reflux/indigestion. Dose varies depending on severity.).       Marland Kitchen lisinopril (PRINIVIL,ZESTRIL) 40 MG tablet Take 80 mg by mouth at bedtime.       . metoprolol (LOPRESSOR) 25 MG tablet Take 4 tablets (100 mg total) by mouth 2 (two) times daily.  30 tablet  0  . sevelamer carbonate (RENVELA) 800 MG tablet Take 2,400-5,600 mg by mouth 3 (three) times daily with meals. Takes 3-4 tablets if the meal is light and 6-7 tablets if he eats a big meal.       No current facility-administered medications on file prior to visit.     Physical exam:  Filed Vitals:   04/28/13 1608  BP: 193/103  Pulse: 125  Resp: 18  Height: 5' 6"  (1.676 m)   Weight: 163 lb (73.936 kg)    Left upper extremity: Healed incisions left upper arm left forearm no obvious areas of erythema Right upper extremity: Easily palpable fistula positive thrill in fistula visible on the skin surface    Data: Patent right upper arm AV fistula diameter 6-8 mm depth less than 3 mm from the skin possible mild narrowing at proximal anastomosis  Assessment: Maturing right arm AV fistula should be ready for cannulation in 3 weeks #2 neuropathic pain left upper extremity. We'll give a trial of Lyrica 25 mg once daily a one-month supply 2 refills given today the patient will followup on as-needed basis.  Ruta Hinds, MD [facility] Office: [phone] Pager: [phone]

## 2013-04-29 DIAGNOSIS — Z992 Dependence on renal dialysis: Secondary | ICD-10-CM | POA: Diagnosis not present

## 2013-04-29 DIAGNOSIS — D509 Iron deficiency anemia, unspecified: Secondary | ICD-10-CM | POA: Diagnosis not present

## 2013-04-29 DIAGNOSIS — N186 End stage renal disease: Secondary | ICD-10-CM | POA: Diagnosis not present

## 2013-04-29 DIAGNOSIS — N2581 Secondary hyperparathyroidism of renal origin: Secondary | ICD-10-CM | POA: Diagnosis not present

## 2013-04-29 DIAGNOSIS — D631 Anemia in chronic kidney disease: Secondary | ICD-10-CM | POA: Diagnosis not present

## 2013-05-03 DIAGNOSIS — N2581 Secondary hyperparathyroidism of renal origin: Secondary | ICD-10-CM | POA: Diagnosis not present

## 2013-05-03 DIAGNOSIS — N186 End stage renal disease: Secondary | ICD-10-CM | POA: Diagnosis not present

## 2013-05-03 DIAGNOSIS — D631 Anemia in chronic kidney disease: Secondary | ICD-10-CM | POA: Diagnosis not present

## 2013-05-03 DIAGNOSIS — Z992 Dependence on renal dialysis: Secondary | ICD-10-CM | POA: Diagnosis not present

## 2013-05-03 DIAGNOSIS — D509 Iron deficiency anemia, unspecified: Secondary | ICD-10-CM | POA: Diagnosis not present

## 2013-05-05 ENCOUNTER — Telehealth: Payer: Self-pay | Admitting: *Deleted

## 2013-05-05 NOTE — Telephone Encounter (Signed)
I spoke to patient regarding his Lyrica Rx; his [employer] will not authorize this medication. They will allow Neurontin per their formulary. Per Dr. [name] this Rx was switched to Neurontin 169m TID. Patient was agreeable with this plan.

## 2013-05-06 DIAGNOSIS — N039 Chronic nephritic syndrome with unspecified morphologic changes: Secondary | ICD-10-CM | POA: Diagnosis not present

## 2013-05-06 DIAGNOSIS — D631 Anemia in chronic kidney disease: Secondary | ICD-10-CM | POA: Diagnosis not present

## 2013-05-06 DIAGNOSIS — D509 Iron deficiency anemia, unspecified: Secondary | ICD-10-CM | POA: Diagnosis not present

## 2013-05-06 DIAGNOSIS — N186 End stage renal disease: Secondary | ICD-10-CM | POA: Diagnosis not present

## 2013-05-06 DIAGNOSIS — N2581 Secondary hyperparathyroidism of renal origin: Secondary | ICD-10-CM | POA: Diagnosis not present

## 2013-05-06 DIAGNOSIS — E119 Type 2 diabetes mellitus without complications: Secondary | ICD-10-CM | POA: Diagnosis not present

## 2013-05-06 DIAGNOSIS — Z992 Dependence on renal dialysis: Secondary | ICD-10-CM | POA: Diagnosis not present

## 2013-05-09 DIAGNOSIS — N186 End stage renal disease: Secondary | ICD-10-CM | POA: Diagnosis not present

## 2013-05-09 DIAGNOSIS — D509 Iron deficiency anemia, unspecified: Secondary | ICD-10-CM | POA: Diagnosis not present

## 2013-05-09 DIAGNOSIS — N2581 Secondary hyperparathyroidism of renal origin: Secondary | ICD-10-CM | POA: Diagnosis not present

## 2013-05-09 DIAGNOSIS — D631 Anemia in chronic kidney disease: Secondary | ICD-10-CM | POA: Diagnosis not present

## 2013-05-09 DIAGNOSIS — E119 Type 2 diabetes mellitus without complications: Secondary | ICD-10-CM | POA: Diagnosis not present

## 2013-05-09 DIAGNOSIS — Z992 Dependence on renal dialysis: Secondary | ICD-10-CM | POA: Diagnosis not present

## 2013-05-11 DIAGNOSIS — N186 End stage renal disease: Secondary | ICD-10-CM | POA: Diagnosis not present

## 2013-05-11 DIAGNOSIS — D631 Anemia in chronic kidney disease: Secondary | ICD-10-CM | POA: Diagnosis not present

## 2013-05-11 DIAGNOSIS — E119 Type 2 diabetes mellitus without complications: Secondary | ICD-10-CM | POA: Diagnosis not present

## 2013-05-11 DIAGNOSIS — N2581 Secondary hyperparathyroidism of renal origin: Secondary | ICD-10-CM | POA: Diagnosis not present

## 2013-05-11 DIAGNOSIS — Z992 Dependence on renal dialysis: Secondary | ICD-10-CM | POA: Diagnosis not present

## 2013-05-11 DIAGNOSIS — D509 Iron deficiency anemia, unspecified: Secondary | ICD-10-CM | POA: Diagnosis not present

## 2013-05-13 DIAGNOSIS — E119 Type 2 diabetes mellitus without complications: Secondary | ICD-10-CM | POA: Diagnosis not present

## 2013-05-13 DIAGNOSIS — Z992 Dependence on renal dialysis: Secondary | ICD-10-CM | POA: Diagnosis not present

## 2013-05-13 DIAGNOSIS — N186 End stage renal disease: Secondary | ICD-10-CM | POA: Diagnosis not present

## 2013-05-13 DIAGNOSIS — D509 Iron deficiency anemia, unspecified: Secondary | ICD-10-CM | POA: Diagnosis not present

## 2013-05-13 DIAGNOSIS — N2581 Secondary hyperparathyroidism of renal origin: Secondary | ICD-10-CM | POA: Diagnosis not present

## 2013-05-13 DIAGNOSIS — D631 Anemia in chronic kidney disease: Secondary | ICD-10-CM | POA: Diagnosis not present

## 2013-05-16 ENCOUNTER — Encounter: Payer: Self-pay | Admitting: Cardiology

## 2013-05-16 DIAGNOSIS — D509 Iron deficiency anemia, unspecified: Secondary | ICD-10-CM | POA: Diagnosis not present

## 2013-05-16 DIAGNOSIS — N2581 Secondary hyperparathyroidism of renal origin: Secondary | ICD-10-CM | POA: Diagnosis not present

## 2013-05-16 DIAGNOSIS — D631 Anemia in chronic kidney disease: Secondary | ICD-10-CM | POA: Diagnosis not present

## 2013-05-16 DIAGNOSIS — N186 End stage renal disease: Secondary | ICD-10-CM | POA: Diagnosis not present

## 2013-05-16 DIAGNOSIS — Z992 Dependence on renal dialysis: Secondary | ICD-10-CM | POA: Diagnosis not present

## 2013-05-16 DIAGNOSIS — E119 Type 2 diabetes mellitus without complications: Secondary | ICD-10-CM | POA: Diagnosis not present

## 2013-05-18 DIAGNOSIS — Z992 Dependence on renal dialysis: Secondary | ICD-10-CM | POA: Diagnosis not present

## 2013-05-18 DIAGNOSIS — N186 End stage renal disease: Secondary | ICD-10-CM | POA: Diagnosis not present

## 2013-05-18 DIAGNOSIS — N2581 Secondary hyperparathyroidism of renal origin: Secondary | ICD-10-CM | POA: Diagnosis not present

## 2013-05-18 DIAGNOSIS — D509 Iron deficiency anemia, unspecified: Secondary | ICD-10-CM | POA: Diagnosis not present

## 2013-05-18 DIAGNOSIS — E119 Type 2 diabetes mellitus without complications: Secondary | ICD-10-CM | POA: Diagnosis not present

## 2013-05-18 DIAGNOSIS — D631 Anemia in chronic kidney disease: Secondary | ICD-10-CM | POA: Diagnosis not present

## 2013-05-20 DIAGNOSIS — Z992 Dependence on renal dialysis: Secondary | ICD-10-CM | POA: Diagnosis not present

## 2013-05-20 DIAGNOSIS — D631 Anemia in chronic kidney disease: Secondary | ICD-10-CM | POA: Diagnosis not present

## 2013-05-20 DIAGNOSIS — N2581 Secondary hyperparathyroidism of renal origin: Secondary | ICD-10-CM | POA: Diagnosis not present

## 2013-05-20 DIAGNOSIS — N186 End stage renal disease: Secondary | ICD-10-CM | POA: Diagnosis not present

## 2013-05-20 DIAGNOSIS — E119 Type 2 diabetes mellitus without complications: Secondary | ICD-10-CM | POA: Diagnosis not present

## 2013-05-20 DIAGNOSIS — D509 Iron deficiency anemia, unspecified: Secondary | ICD-10-CM | POA: Diagnosis not present

## 2013-05-23 DIAGNOSIS — Z992 Dependence on renal dialysis: Secondary | ICD-10-CM | POA: Diagnosis not present

## 2013-05-23 DIAGNOSIS — E119 Type 2 diabetes mellitus without complications: Secondary | ICD-10-CM | POA: Diagnosis not present

## 2013-05-23 DIAGNOSIS — D631 Anemia in chronic kidney disease: Secondary | ICD-10-CM | POA: Diagnosis not present

## 2013-05-23 DIAGNOSIS — D509 Iron deficiency anemia, unspecified: Secondary | ICD-10-CM | POA: Diagnosis not present

## 2013-05-23 DIAGNOSIS — N2581 Secondary hyperparathyroidism of renal origin: Secondary | ICD-10-CM | POA: Diagnosis not present

## 2013-05-23 DIAGNOSIS — N186 End stage renal disease: Secondary | ICD-10-CM | POA: Diagnosis not present

## 2013-05-25 DIAGNOSIS — Z992 Dependence on renal dialysis: Secondary | ICD-10-CM | POA: Diagnosis not present

## 2013-05-25 DIAGNOSIS — E119 Type 2 diabetes mellitus without complications: Secondary | ICD-10-CM | POA: Diagnosis not present

## 2013-05-25 DIAGNOSIS — N2581 Secondary hyperparathyroidism of renal origin: Secondary | ICD-10-CM | POA: Diagnosis not present

## 2013-05-25 DIAGNOSIS — D631 Anemia in chronic kidney disease: Secondary | ICD-10-CM | POA: Diagnosis not present

## 2013-05-25 DIAGNOSIS — N186 End stage renal disease: Secondary | ICD-10-CM | POA: Diagnosis not present

## 2013-05-25 DIAGNOSIS — D509 Iron deficiency anemia, unspecified: Secondary | ICD-10-CM | POA: Diagnosis not present

## 2013-05-26 ENCOUNTER — Ambulatory Visit: Payer: Medicare Other | Admitting: Cardiology

## 2013-05-27 DIAGNOSIS — N186 End stage renal disease: Secondary | ICD-10-CM | POA: Diagnosis not present

## 2013-05-27 DIAGNOSIS — Z992 Dependence on renal dialysis: Secondary | ICD-10-CM | POA: Diagnosis not present

## 2013-05-27 DIAGNOSIS — D631 Anemia in chronic kidney disease: Secondary | ICD-10-CM | POA: Diagnosis not present

## 2013-05-27 DIAGNOSIS — E119 Type 2 diabetes mellitus without complications: Secondary | ICD-10-CM | POA: Diagnosis not present

## 2013-05-27 DIAGNOSIS — N2581 Secondary hyperparathyroidism of renal origin: Secondary | ICD-10-CM | POA: Diagnosis not present

## 2013-05-27 DIAGNOSIS — D509 Iron deficiency anemia, unspecified: Secondary | ICD-10-CM | POA: Diagnosis not present

## 2013-05-30 DIAGNOSIS — E119 Type 2 diabetes mellitus without complications: Secondary | ICD-10-CM | POA: Diagnosis not present

## 2013-05-30 DIAGNOSIS — N186 End stage renal disease: Secondary | ICD-10-CM | POA: Diagnosis not present

## 2013-05-30 DIAGNOSIS — N2581 Secondary hyperparathyroidism of renal origin: Secondary | ICD-10-CM | POA: Diagnosis not present

## 2013-05-30 DIAGNOSIS — D509 Iron deficiency anemia, unspecified: Secondary | ICD-10-CM | POA: Diagnosis not present

## 2013-05-30 DIAGNOSIS — D631 Anemia in chronic kidney disease: Secondary | ICD-10-CM | POA: Diagnosis not present

## 2013-05-30 DIAGNOSIS — Z992 Dependence on renal dialysis: Secondary | ICD-10-CM | POA: Diagnosis not present

## 2013-05-31 ENCOUNTER — Ambulatory Visit (INDEPENDENT_AMBULATORY_CARE_PROVIDER_SITE_OTHER): Payer: Medicare Other | Admitting: Cardiology

## 2013-05-31 ENCOUNTER — Encounter: Payer: Self-pay | Admitting: Cardiology

## 2013-05-31 VITALS — BP 160/92 | HR 80 | Ht 66.0 in | Wt 167.0 lb

## 2013-05-31 DIAGNOSIS — R0602 Shortness of breath: Secondary | ICD-10-CM | POA: Diagnosis not present

## 2013-05-31 DIAGNOSIS — R002 Palpitations: Secondary | ICD-10-CM | POA: Diagnosis not present

## 2013-05-31 DIAGNOSIS — I498 Other specified cardiac arrhythmias: Secondary | ICD-10-CM

## 2013-05-31 DIAGNOSIS — I471 Supraventricular tachycardia: Secondary | ICD-10-CM

## 2013-05-31 DIAGNOSIS — R9439 Abnormal result of other cardiovascular function study: Secondary | ICD-10-CM

## 2013-05-31 MED ORDER — METOPROLOL SUCCINATE ER 25 MG PO TB24: 25.0000 mg | ORAL_TABLET | Freq: Every day | ORAL | Status: DC

## 2013-05-31 NOTE — Patient Instructions (Addendum)
Your physician has recommended you make the following change in your medication:  1. Make sure you take Toprol XL 25 mg 1 tablet daily.   Your physician has requested that you have a lexiscan myoview. For further information please visit [email or web address]. Please follow instruction sheet, as given.  Your physician has recommended that you wear a holter monitor. Holter monitors are medical devices that record the heart's electrical activity. Doctors most often use these monitors to diagnose arrhythmias. Arrhythmias are problems with the speed or rhythm of the heartbeat. The monitor is a small, portable device. You can wear one while you do your normal daily activities. This is usually used to diagnose what is causing palpitations/syncope (passing out).  Your physician recommends that you schedule a follow-up appointment with Dr. Meda Coffee after testing.   You have an order for cmet and tsh. Please take order to dialysis and have them draw and fax to [place].

## 2013-05-31 NOTE — Progress Notes (Signed)
Patient ID: James Wall, male   DOB: [birth year], 48 y.o.   MRN: [id number]    Patient Name: James Wall [facility] Date of Encounter: 05/31/2013  Primary Care Provider:  [name], MD Primary Cardiologist:  Dorothy Spark  Problem List   Past Medical History  Diagnosis Date  . Chronic kidney disease   . Hypertension   . COPD (chronic obstructive pulmonary disease)   . Arthritis     GOUT  . Anemia   . Secondary hyperparathyroidism   . Anxiety   . Depression   . Shortness of breath     With exertion  . GERD (gastroesophageal reflux disease)   . Sepsis 02/2013    from AVF , treated with Vancomycin.   Past Surgical History  Procedure Laterality Date  . Av fistula placement  2009    Left lower arm AVF  . Ligation of arteriovenous  fistula Left 12/22/2012    Procedure: LIGATION OF ARTERIOVENOUS  FISTULA;EXCISION OF LARGE ANEURYSMS;;  Surgeon: Elam Dutch, MD;  Location: [place];  Service: Vascular;  Laterality: Left;  . Insertion of dialysis catheter N/A 12/23/2012    Procedure: INSERTION OF DIALYSIS CATHETER; ULTRASOUND GUIDED;  Surgeon: Angelia Mould, MD;  Location: [place];  Service: Vascular;  Laterality: N/A;  . Av fistula placement Right 02/22/2013    Procedure:  CREATION  OF BRACHIAL CEPHALIC FISTULA RIGHT ARM;  Surgeon: Elam Dutch, MD;  Location: [place];  Service: Vascular;  Laterality: Right;  . Esophagogastroduodenoscopy (egd) with propofol N/A 04/12/2013    Procedure: ESOPHAGOGASTRODUODENOSCOPY (EGD) WITH PROPOFOL;  Surgeon: Arta Silence, MD;  Location: [facility];  Service: Endoscopy;  Laterality: N/A;   Allergies  Allergies  Allergen Reactions  . Aspirin     Tends to make him "feel worse" when he tries to take it for a cold or something    HPI  48 year old gentleman with prior medical history of hypertension that subsequently led to end-stage renal disease on renal hemodialysis for the last 6 years. The patient also has a  diagnosis of liver cirrhosis of unknown etiology. The patient is coming with concerns of fatigue, shortness of breath, and weight loss. The patient states that he has lost significant amount of weight in the last 2 years. He is minimally active as he feels as he has no energy. And he gets short of breath with minimal activity. The patient denies any chest pain. He has also been experiencing palpitations associated with shortness of breath at the end of his dialysis. He was prescribed when necessary metoprolol that he takes toward the end of his hemodialysis session and helps with his symptoms. The patient denies syncope.  Home Medications  Prior to Admission medications   Medication Sig Start Date End Date Taking? Authorizing Provider  ALPRAZolam Duanne Moron) 0.5 MG tablet Take 0.5-1 mg by mouth 3 (three) times daily as needed for anxiety.   Yes Historical Provider, MD  amLODipine (NORVASC) 10 MG tablet Take 10 mg by mouth at bedtime.    Yes Historical Provider, MD  cloNIDine (CATAPRES - DOSED IN MG/24 HR) 0.3 mg/24hr Place 1 patch onto the skin once a week. Saturday.   Yes Historical Provider, MD  esomeprazole (NEXIUM) 40 MG capsule Take 40-80 mg by mouth daily as needed (reflux/indigestion. Dose varies depending on severity.).    Yes Historical Provider, MD  lisinopril (PRINIVIL,ZESTRIL) 40 MG tablet Take 40 mg by mouth at bedtime.    Yes Historical Provider, MD  metoprolol succinate (  TOPROL-XL) 25 MG 24 hr tablet  05/13/13  Yes Historical Provider, MD  sevelamer carbonate (RENVELA) 800 MG tablet Take 2,400-5,600 mg by mouth 3 (three) times daily with meals. Takes 3-4 tablets if the meal is light and 6-7 tablets if he eats a big meal.   Yes     Family History  Family History  Problem Relation Age of Onset  . Stroke Father     Social History  History   Social History  . Marital Status: Single    Spouse Name: N/A    Number of Children: 0  . Years of Education: 12th   Occupational History    . n/a    Social History Main Topics  . Smoking status: Current Every Day Smoker -- 0.50 packs/day for 16 years    Types: Cigarettes  . Smokeless tobacco: Never Used  . Alcohol Use: Yes     Comment: occassional  . Drug Use: No  . Sexual Activity: Not on file   Other Topics Concern  . Not on file   Social History Narrative  . No narrative on file     Review of Systems, as per HPI, otherwise negative General:  No chills, fever, night sweats or weight changes.  Cardiovascular:  No chest pain, dyspnea on exertion, edema, orthopnea, palpitations, paroxysmal nocturnal dyspnea. Dermatological: No rash, lesions/masses Respiratory: No cough, dyspnea Urologic: No hematuria, dysuria Abdominal:   No nausea, vomiting, diarrhea, bright red blood per rectum, melena, or hematemesis Neurologic:  No visual changes, wkns, changes in mental status. All other systems reviewed and are otherwise negative except as noted above.  Physical Exam  Blood pressure 160/92, pulse 80, height 5' 6"  (1.676 m), weight 167 lb (75.751 kg).  General: Pleasant, NAD Psych: Normal affect. Neuro: Alert and oriented X 3. Moves all extremities spontaneously. HEENT: Normal  Neck: Supple without bruits or JVD. Lungs:  Resp regular and unlabored, CTA. Heart: RRR no s3, s4, or murmurs. Abdomen: Soft, non-tender, non-distended, BS + x 4.  Extremities: No clubbing, cyanosis or edema. DP/PT/Radials 2+ and equal bilaterally. AV fistula on the right arm.  Labs:  No results found for this basename: CKTOTAL, CKMB, TROPONINI,  in the last 72 hours Lab Results  Component Value Date   WBC 8.0 04/19/2013   HGB 8.5* 04/19/2013   HCT 25.6* 04/19/2013   MCV 98.8 04/19/2013   PLT 181 04/19/2013       Component Value Date/Time   NA 146 04/19/2013 1021   K 2.6* 04/19/2013 1021   CL 111 04/19/2013 1021   CO2 16* 04/19/2013 1021   GLUCOSE 71 04/19/2013 1021   BUN 27* 04/19/2013 1021   CREATININE 7.52* 04/19/2013 1021   CALCIUM 6.7*  04/19/2013 1021   CALCIUM 7.7* 02/18/2007 1522   PROT 4.9* 04/19/2013 1021   ALBUMIN 2.1* 04/19/2013 1021   AST 9 04/19/2013 1021   ALT 9 04/19/2013 1021   ALKPHOS 61 04/19/2013 1021   BILITOT 0.4 04/19/2013 1021   GFRNONAA 8* 04/19/2013 1021   GFRAA 9* 04/19/2013 1021   Accessory Clinical Findings  Echocardiogram - 12/23/2012 Left ventricle: The cavity size was normal. Wall thickness was increased in a pattern of severe LVH. Systolic function was normal. The estimated ejection fraction was in the range of 60% to 65%. Wall motion was normal; there were no regional wall motion abnormalities. - Left atrium: The atrium was mildly dilated. - Pericardium, extracardiac: A trivial pericardial effusion was identified posterior to the heart.  ECG -  04/19/2013 SR, LAD, LAFB, negative T waves in the lateral leads   Assessment & Plan  A 48 year old male with history of hypertension and end-stage renal disease on hemodialysis  1. exertional dyspnea, fatigue - abnormal EKG with negative T waves in the lateral leads. We will order a Lexiscan nuclear stress test to evaluate for ischemia. The echocardiogram in November of last year showed preserved LV function with mild LVH. No regional wall motion abnormalities.  2. palpitations associated with shortness of breath - we will order a 48-hour Holter monitor to evaluate for possible arrhythmias.  3. Hypertension - uncontrolled however frank about hypotension toward the end of dialysis medication doesn't feel well. He is advised to use Toprol XL 25 mg every day not just when necessary to help with his blood pressure is well as per vent tachycardia is toward the end of dialysis.  Followup in 6 weeks.  Dorothy Spark, MD, [facility] 05/31/2013, 9:41 AM

## 2013-06-01 DIAGNOSIS — N186 End stage renal disease: Secondary | ICD-10-CM | POA: Diagnosis not present

## 2013-06-01 DIAGNOSIS — E119 Type 2 diabetes mellitus without complications: Secondary | ICD-10-CM | POA: Diagnosis not present

## 2013-06-01 DIAGNOSIS — Z992 Dependence on renal dialysis: Secondary | ICD-10-CM | POA: Diagnosis not present

## 2013-06-01 DIAGNOSIS — D509 Iron deficiency anemia, unspecified: Secondary | ICD-10-CM | POA: Diagnosis not present

## 2013-06-01 DIAGNOSIS — N2581 Secondary hyperparathyroidism of renal origin: Secondary | ICD-10-CM | POA: Diagnosis not present

## 2013-06-01 DIAGNOSIS — D631 Anemia in chronic kidney disease: Secondary | ICD-10-CM | POA: Diagnosis not present

## 2013-06-02 ENCOUNTER — Ambulatory Visit: Payer: Medicare Other | Admitting: Cardiology

## 2013-06-02 DIAGNOSIS — N186 End stage renal disease: Secondary | ICD-10-CM | POA: Diagnosis not present

## 2013-06-03 DIAGNOSIS — N186 End stage renal disease: Secondary | ICD-10-CM | POA: Diagnosis not present

## 2013-06-03 DIAGNOSIS — Z992 Dependence on renal dialysis: Secondary | ICD-10-CM | POA: Diagnosis not present

## 2013-06-03 DIAGNOSIS — E039 Hypothyroidism, unspecified: Secondary | ICD-10-CM | POA: Insufficient documentation

## 2013-06-03 DIAGNOSIS — N2581 Secondary hyperparathyroidism of renal origin: Secondary | ICD-10-CM | POA: Diagnosis not present

## 2013-06-03 DIAGNOSIS — D509 Iron deficiency anemia, unspecified: Secondary | ICD-10-CM | POA: Diagnosis not present

## 2013-06-03 DIAGNOSIS — E119 Type 2 diabetes mellitus without complications: Secondary | ICD-10-CM | POA: Diagnosis not present

## 2013-06-03 DIAGNOSIS — D631 Anemia in chronic kidney disease: Secondary | ICD-10-CM | POA: Diagnosis not present

## 2013-06-14 ENCOUNTER — Encounter: Payer: Self-pay | Admitting: *Deleted

## 2013-06-14 ENCOUNTER — Encounter (INDEPENDENT_AMBULATORY_CARE_PROVIDER_SITE_OTHER): Payer: Medicare Other

## 2013-06-14 ENCOUNTER — Encounter (HOSPITAL_COMMUNITY): Payer: Medicare Other

## 2013-06-14 DIAGNOSIS — I471 Supraventricular tachycardia: Secondary | ICD-10-CM

## 2013-06-14 DIAGNOSIS — R0602 Shortness of breath: Secondary | ICD-10-CM

## 2013-06-14 NOTE — Progress Notes (Signed)
Patient ID: James Wall, male   DOB: [birth year], 48 y.o.   MRN: [id number] E-Cardio 48 hour holter monitor applied to patient.

## 2013-06-15 ENCOUNTER — Telehealth: Payer: Self-pay | Admitting: Cardiology

## 2013-06-15 ENCOUNTER — Ambulatory Visit: Payer: Medicare Other | Admitting: Cardiology

## 2013-06-15 DIAGNOSIS — E039 Hypothyroidism, unspecified: Secondary | ICD-10-CM | POA: Diagnosis not present

## 2013-06-15 NOTE — Telephone Encounter (Signed)
Called patient back. He is worried about having lexiscan myoview tomorrow because he states he already has pressure on and off over his heart. Advised that Dr.Nelson ordered the test to rule our blockages. He kept stating that" him and his family do not think he should have the test done" because of his symptoms. Advised the other test to rule out CAD is a heart catherization which is invasive with risks. Patient replied that "no one is shooting dye into me ever." Advised the patient that if he does not want to go thru with the nuclear scan that he is not being forced to do this. He states that he is aware of this but still repeated several times that "he has notified us of how he feels". States that he will be here tomorrow for the test. Will forward note to [place] and her nurse.

## 2013-06-15 NOTE — Telephone Encounter (Signed)
New Prob    Pt has some questions regarding his study tomorrow. Please call.

## 2013-06-15 NOTE — Telephone Encounter (Signed)
Spoke with pt about him being anxious to have lexiscan myoview done tomorrow.  Provided therapeutic communication and calmed pt down.  Explained what exactly a lexiscan myoview is and what exactly to expect.  Pt verbalizes understanding and very pleased with the call back and for explaining in detail what to expect tomorrow.  Advised pt to feel free to call us at anytime.

## 2013-06-16 ENCOUNTER — Ambulatory Visit (HOSPITAL_COMMUNITY): Payer: Medicare Other | Attending: Cardiovascular Disease | Admitting: Radiology

## 2013-06-16 ENCOUNTER — Encounter (INDEPENDENT_AMBULATORY_CARE_PROVIDER_SITE_OTHER): Payer: Self-pay

## 2013-06-16 VITALS — BP 150/83 | Ht 66.0 in | Wt 164.0 lb

## 2013-06-16 DIAGNOSIS — R079 Chest pain, unspecified: Secondary | ICD-10-CM | POA: Diagnosis not present

## 2013-06-16 DIAGNOSIS — I498 Other specified cardiac arrhythmias: Secondary | ICD-10-CM | POA: Diagnosis not present

## 2013-06-16 DIAGNOSIS — I471 Supraventricular tachycardia: Secondary | ICD-10-CM

## 2013-06-16 DIAGNOSIS — R0602 Shortness of breath: Secondary | ICD-10-CM | POA: Insufficient documentation

## 2013-06-16 MED ORDER — REGADENOSON 0.4 MG/5ML IV SOLN
0.4000 mg | Freq: Once | INTRAVENOUS | Status: AC
  Administered 2013-06-16: 0.4 mg via INTRAVENOUS

## 2013-06-16 MED ORDER — TECHNETIUM TC 99M SESTAMIBI GENERIC - CARDIOLITE
10.8000 | Freq: Once | INTRAVENOUS | Status: AC | PRN
  Administered 2013-06-16: 11 via INTRAVENOUS

## 2013-06-16 MED ORDER — TECHNETIUM TC 99M SESTAMIBI GENERIC - CARDIOLITE
33.0000 | Freq: Once | INTRAVENOUS | Status: AC | PRN
  Administered 2013-06-16: 33 via INTRAVENOUS

## 2013-06-16 NOTE — Progress Notes (Signed)
[facility] [address] [place], [place] [address] [phone]    Cardiology Nuclear Med Study  Haitham Dolinsky is a 48 y.o. male     MRN : [id number]     DOB: [birth year]  Procedure Date: 06/16/2013  Nuclear Med Background Indication for Stress Test:  Evaluation for Ischemia and Abnormal EKG History:  2014 Echo EF 60-65%. LVH, SVT Cardiac Risk Factors: Hypertension and Smoker  Symptoms:  Chest Pain, DOE, Palpitations and SOB   Nuclear Pre-Procedure Caffeine/Decaff Intake:  None NPO After: 7:00pm   Lungs:  clear O2 Sat: 93% on room air. IV 0.9% NS with Angio Cath:  22g  IV Site: L Hand  IV Started by:  Crissie Figures, RN  Chest Size (in):  42 Cup Size: n/a  Height: 5' 6"  (1.676 m)  Weight:  164 lb (74.39 kg)  BMI:  Body mass index is 26.48 kg/(m^2). Tech Comments:  BP/IV Left arm only    Nuclear Med Study 1 or 2 day study: 2 day  Stress Test Type:  Lexiscan  Reading MD: N/A  Order Authorizing Provider:  Ena Dawley, MD  Resting Radionuclide: Technetium 22mSestamibi  Resting Radionuclide Dose: 11.0 mCi   Stress Radionuclide:  Technetium 966mestamibi  Stress Radionuclide Dose: 33.0 mCi           Stress Protocol Rest HR: 75 Stress HR: 87  Rest BP: 150/83 Stress BP: 141/84  Exercise Time (min): n/a METS: n/a   Predicted Max HR: 172 bpm % Max HR: 50.58 bpm Rate Pressure Product: 12267   Dose of Adenosine (mg):  n/a Dose of Lexiscan: 0.4 mg  Dose of Atropine (mg): n/a Dose of Dobutamine: n/a mcg/kg/min (at max HR)  Stress Test Technologist: JaCrissie FiguresRN  Nuclear Technologist:  StCharlton AmorCNMT     Rest Procedure:  Myocardial perfusion imaging was performed at rest 45 minutes following the intravenous administration of Technetium 9931mstamibi. Rest ECG: Sinus rhythm, 75, LVH, T-wave inversion in inferior leads, lateral precordial leads.  Stress Procedure:  The patient received IV Lexiscan 0.4 mg over 15-seconds.   Technetium 62m72mtamibi injected at 30-seconds.  Quantitative spect images were obtained after a 45 minute delay. Stress ECG: Accentuation of nonspecific ST-T wave changes  QPS Raw Data Images:  Normal; no motion artifact; normal heart/lung ratio. Stress Images:  There is mild decreased uptake of moderate sized along the anterior wall distribution, mid to distal seen at stress concerning for ischemia. Otherwise homogeneous radiotracer uptake. Rest Images:  Normal homogeneous uptake in all areas of the myocardium. Subtraction (SDS):  3 Transient Ischemic Dilatation (Normal <1.22):  1.00 Lung/Heart Ratio (Normal <0.45):  0.42  Quantitative Gated Spect Images QGS EDV:  n/a ml QGS ESV:  n/a ml  Impression Exercise Capacity:  Lexiscan with no exercise. BP Response:  Normal blood pressure response. Clinical Symptoms:  Typical symptoms with Lexiscan ECG Impression:  No significant ST segment change suggestive of ischemia. Comparison with Prior Nuclear Study: No images to compare  Overall Impression:  Intermediate risk stress nuclear study with mid to distal anterior wall ischemia.  LV Ejection Fraction: Study not gated.  LV Wall Motion:     James Wall

## 2013-06-21 ENCOUNTER — Encounter: Payer: Self-pay | Admitting: Cardiology

## 2013-06-21 ENCOUNTER — Encounter: Payer: Self-pay | Admitting: *Deleted

## 2013-06-21 ENCOUNTER — Ambulatory Visit (INDEPENDENT_AMBULATORY_CARE_PROVIDER_SITE_OTHER): Payer: Medicare Other | Admitting: Cardiology

## 2013-06-21 VITALS — BP 182/84 | HR 72 | Ht 66.0 in | Wt 166.8 lb

## 2013-06-21 DIAGNOSIS — R9439 Abnormal result of other cardiovascular function study: Secondary | ICD-10-CM | POA: Diagnosis not present

## 2013-06-21 MED ORDER — ISOSORBIDE MONONITRATE ER 30 MG PO TB24: 30.0000 mg | ORAL_TABLET | Freq: Every day | ORAL | Status: DC

## 2013-06-21 MED ORDER — ATORVASTATIN CALCIUM 20 MG PO TABS: 20.0000 mg | ORAL_TABLET | Freq: Every day | ORAL | Status: DC

## 2013-06-21 NOTE — Progress Notes (Signed)
Patient ID: Gianlucca Szymborski, male   DOB: [birth year], 48 y.o.   MRN: [id number]    Patient Name: Ayo Smoak [facility] Date of Encounter: 06/21/2013  Primary Care Provider:  [name], MD Primary Cardiologist:  Dorothy Spark  Problem List   Past Medical History  Diagnosis Date  . Chronic kidney disease   . Hypertension   . COPD (chronic obstructive pulmonary disease)   . Arthritis     GOUT  . Anemia   . Secondary hyperparathyroidism   . Anxiety   . Depression   . Shortness of breath     With exertion  . GERD (gastroesophageal reflux disease)   . Sepsis 02/2013    from AVF , treated with Vancomycin.   Past Surgical History  Procedure Laterality Date  . Av fistula placement  2009    Left lower arm AVF  . Ligation of arteriovenous  fistula Left 12/22/2012    Procedure: LIGATION OF ARTERIOVENOUS  FISTULA;EXCISION OF LARGE ANEURYSMS;;  Surgeon: Elam Dutch, MD;  Location: [place];  Service: Vascular;  Laterality: Left;  . Insertion of dialysis catheter N/A 12/23/2012    Procedure: INSERTION OF DIALYSIS CATHETER; ULTRASOUND GUIDED;  Surgeon: Angelia Mould, MD;  Location: [place];  Service: Vascular;  Laterality: N/A;  . Av fistula placement Right 02/22/2013    Procedure:  CREATION  OF BRACHIAL CEPHALIC FISTULA RIGHT ARM;  Surgeon: Elam Dutch, MD;  Location: [place];  Service: Vascular;  Laterality: Right;  . Esophagogastroduodenoscopy (egd) with propofol N/A 04/12/2013    Procedure: ESOPHAGOGASTRODUODENOSCOPY (EGD) WITH PROPOFOL;  Surgeon: Arta Silence, MD;  [facility];  Service: Endoscopy;  Laterality: N/A;   Allergies  Allergies  Allergen Reactions  . Aspirin     Tends to make him "feel worse" when he tries to take it for a cold or something    HPI  48 year old gentleman with prior medical history of hypertension that subsequently led to end-stage renal disease on renal hemodialysis for the last 6 years. The patient also has a  diagnosis of liver cirrhosis of unknown etiology. The patient is coming with concerns of fatigue, shortness of breath, and weight loss. The patient states that he has lost significant amount of weight in the last 2 years. He is minimally active as he feels as he has no energy. And he gets short of breath with minimal activity. The patient denies any chest pain. He has also been experiencing palpitations associated with shortness of breath at the end of his dialysis. He was prescribed when necessary metoprolol that he takes toward the end of his hemodialysis session and helps with his symptoms. The patient denies syncope.  Home Medications  Prior to Admission medications   Medication Sig Start Date End Date Taking? Authorizing Provider  ALPRAZolam Duanne Moron) 0.5 MG tablet Take 0.5-1 mg by mouth 3 (three) times daily as needed for anxiety.   Yes Historical Provider, MD  amLODipine (NORVASC) 10 MG tablet Take 10 mg by mouth at bedtime.    Yes Historical Provider, MD  cloNIDine (CATAPRES - DOSED IN MG/24 HR) 0.3 mg/24hr Place 1 patch onto the skin once a week. Saturday.   Yes Historical Provider, MD  esomeprazole (NEXIUM) 40 MG capsule Take 40-80 mg by mouth daily as needed (reflux/indigestion. Dose varies depending on severity.).    Yes Historical Provider, MD  lisinopril (PRINIVIL,ZESTRIL) 40 MG tablet Take 40 mg by mouth at bedtime.    Yes Historical Provider, MD  metoprolol succinate (  TOPROL-XL) 25 MG 24 hr tablet  05/13/13  Yes Historical Provider, MD  sevelamer carbonate (RENVELA) 800 MG tablet Take 2,400-5,600 mg by mouth 3 (three) times daily with meals. Takes 3-4 tablets if the meal is light and 6-7 tablets if he eats a big meal.   Yes     Family History  Family History  Problem Relation Age of Onset  . Stroke Father     Social History  History   Social History  . Marital Status: Single    Spouse Name: N/A    Number of Children: 0  . Years of Education: 12th   Occupational History    . n/a    Social History Main Topics  . Smoking status: Current Every Day Smoker -- 0.50 packs/day for 16 years    Types: Cigarettes  . Smokeless tobacco: Never Used  . Alcohol Use: Yes     Comment: occassional  . Drug Use: No  . Sexual Activity: Not on file   Other Topics Concern  . Not on file   Social History Narrative  . No narrative on file     Review of Systems, as per HPI, otherwise negative General:  No chills, fever, night sweats or weight changes.  Cardiovascular:  No chest pain, dyspnea on exertion, edema, orthopnea, palpitations, paroxysmal nocturnal dyspnea. Dermatological: No rash, lesions/masses Respiratory: No cough, dyspnea Urologic: No hematuria, dysuria Abdominal:   No nausea, vomiting, diarrhea, bright red blood per rectum, melena, or hematemesis Neurologic:  No visual changes, wkns, changes in mental status. All other systems reviewed and are otherwise negative except as noted above.  Physical Exam  Blood pressure 182/84, pulse 72, height 5' 6"  (1.676 m), weight 166 lb 12.8 oz (75.66 kg), SpO2 99.00%.  General: Pleasant, NAD Psych: Normal affect. Neuro: Alert and oriented X 3. Moves all extremities spontaneously. HEENT: Normal  Neck: Supple without bruits or JVD. Lungs:  Resp regular and unlabored, CTA. Heart: RRR no s3, s4, or murmurs. Abdomen: Soft, non-tender, non-distended, BS + x 4.  Extremities: No clubbing, cyanosis or edema. DP/PT/Radials 2+ and equal bilaterally. AV fistula on the right arm.  Labs:  No results found for this basename: CKTOTAL, CKMB, TROPONINI,  in the last 72 hours Lab Results  Component Value Date   WBC 8.0 04/19/2013   HGB 8.5* 04/19/2013   HCT 25.6* 04/19/2013   MCV 98.8 04/19/2013   PLT 181 04/19/2013       Component Value Date/Time   NA 146 04/19/2013 1021   K 2.6* 04/19/2013 1021   CL 111 04/19/2013 1021   CO2 16* 04/19/2013 1021   GLUCOSE 71 04/19/2013 1021   BUN 27* 04/19/2013 1021   CREATININE 7.52* 04/19/2013  1021   CALCIUM 6.7* 04/19/2013 1021   CALCIUM 7.7* 02/18/2007 1522   PROT 4.9* 04/19/2013 1021   ALBUMIN 2.1* 04/19/2013 1021   AST 9 04/19/2013 1021   ALT 9 04/19/2013 1021   ALKPHOS 61 04/19/2013 1021   BILITOT 0.4 04/19/2013 1021   GFRNONAA 8* 04/19/2013 1021   GFRAA 9* 04/19/2013 1021   Accessory Clinical Findings  Echocardiogram - 12/23/2012 Left ventricle: The cavity size was normal. Wall thickness was increased in a pattern of severe LVH. Systolic function was normal. The estimated ejection fraction was in the range of 60% to 65%. Wall motion was normal; there were no regional wall motion abnormalities. - Left atrium: The atrium was mildly dilated. - Pericardium, extracardiac: A trivial pericardial effusion was identified posterior to the  heart.  ECG - 04/19/2013 SR, LAD, LAFB, negative T waves in the lateral leads   Assessment & Plan  A 48 year old male with history of hypertension and end-stage renal disease on hemodialysis  1. exertional dyspnea, fatigue - abnormal EKG with negative T waves in the lateral leads, Lexiscan nuclear stress test showed mid to apical anterior wall ischemia (SDS4). We will schedule the patient for a left cardiac cath on a day between 2 HD. The echocardiogram in November of last year showed preserved LV function with mild LVH. No regional wall motion abnormalities. Start atorvastatin 20 mg po daily.  2. Palpitations associated with shortness of breath - 48-hour Holter monitor showed frequent PVC, but no arrhythmias.   3. Hypertension - uncontrolled, we will add imdur 30 mg po daily.   Followup after the cath.  Dorothy Spark, MD, [facility] 06/21/2013, 4:16 PM

## 2013-06-21 NOTE — Patient Instructions (Addendum)
START TAKING IMDUR 30 MG DAILY  START TAKING LIPITOR 20 MG DAILY  PLEASE HAVE YOUR DIALYSIS CENTER DRAW THESE LABS TOMORROW 06-22-13 (BMP , PT/INR, AND CBC WITH DIFF) AND FAX THE RESULTS  TO OUR OFFICE AT [phone].   Your physician has requested that you have a cardiac catheterization. Cardiac catheterization is used to diagnose and/or treat various heart conditions. Doctors may recommend this procedure for a number of different reasons. The most common reason is to evaluate chest pain. Chest pain can be a symptom of coronary artery disease (CAD), and cardiac catheterization can show whether plaque is narrowing or blocking your heart's arteries. This procedure is also used to evaluate the valves, as well as measure the blood flow and oxygen levels in different parts of your heart. For further information please visit [facility]. Please follow instruction sheet, as given.  YOUR CATH IS SCHEDULED FOR NEXT Tuesday 06-28-13 AT 10:30 AM YOU NEED TO ARRIVE AT [place] AT 8:30 AM.   LETTER OF INSTRUCTIONS GIVEN TO PT

## 2013-06-21 NOTE — Addendum Note (Signed)
Addended by: Dorothy Spark on: 06/21/2013 06:25 PM   Modules accepted: Orders

## 2013-06-22 ENCOUNTER — Encounter (HOSPITAL_COMMUNITY): Payer: Self-pay | Admitting: Pharmacy Technician

## 2013-06-22 ENCOUNTER — Telehealth: Payer: Self-pay | Admitting: Cardiology

## 2013-06-22 ENCOUNTER — Encounter: Payer: Self-pay | Admitting: Cardiology

## 2013-06-22 DIAGNOSIS — R9439 Abnormal result of other cardiovascular function study: Secondary | ICD-10-CM | POA: Insufficient documentation

## 2013-06-22 DIAGNOSIS — I4891 Unspecified atrial fibrillation: Secondary | ICD-10-CM | POA: Diagnosis not present

## 2013-06-22 NOTE — Telephone Encounter (Signed)
Sherren Mocha, MD Hetty Blend, RN Cc: Dorothy Spark, MD            I always cath through the radial, but he's had an AV fistula in both arms, so he will be a groin case. If he's not OK with that, there is no reason for him to come in for the procedure.   thx Ronalee Belts      Previous Messages      ----- Message -----  From: Hetty Blend, RN  Sent: 06/22/2013 3:28 PM  To: Sherren Mocha, MD   FYI; spoke with this pt today. Scheduled for cath 5/26. See my note. Just to let you know he may leave if his arm is not used for cath. Thanks , Rande Brunt, MD Hetty Blend, RN Cc: Dorothy Spark, MD            I always cath through the radial, but he's had an AV fistula in both arms, so he will be a groin case. If he's not OK with that, there is no reason for him to come in for the procedure.   thx Ronalee Belts      Previous Messages      ----- Message -----  From: Hetty Blend, RN  Sent: 06/22/2013 3:28 PM  To: Sherren Mocha, MD   FYI; spoke with this pt today. Scheduled for cath 5/26. See my note. Just to let you know he may leave if his arm is not used for cath. Thanks , Rande Brunt, MD Hetty Blend, RN Cc: Dorothy Spark, MD            I always cath through the radial, but he's had an AV fistula in both arms, so he will be a groin case. If he's not OK with that, there is no reason for him to come in for the procedure.   thx Ronalee Belts      Previous Messages      ----- Message -----  From: Hetty Blend, RN  Sent: 06/22/2013 3:28 PM  To: Sherren Mocha, MD   FYI; spoke with this pt today. Scheduled for cath 5/26. See my note. Just to let you know he may leave if his arm is not used for cath. Thanks , [employer] and spoke with pt to notify him of above response from Dr. Burt Knack and he states he is not going to have procedure.  Will cancel cath. Will route to Dr. Meda Coffee and her nurse Freddie Breech

## 2013-06-22 NOTE — Telephone Encounter (Signed)
Patient has questions about procedure. Please call and advise.

## 2013-06-22 NOTE — Telephone Encounter (Signed)
Called stating that he was scheduled for a cath on Tues 5/26.  Wanted to know if was going to be put to sleep.  Advised that he would receive IV sedation.  States he was told that they would either use his arm or "scrotum". Advised that the doctor would decide before procedure if he would be using his arm or groin.  States he would not have procedure unless they use his arm.  States he needs to be put to sleep because IV sedation does not work for him because he can feel it any time they stick him with a needle.  He again states he would not be stuck in his scrotum. Corrected him and told him it was his groin. He still states he won't be stuck "between his legs".  States he will show up for procedure but if they don't use his arm he will leave.

## 2013-06-23 LAB — PROTIME-INR: INR: 1.2 — AB (ref 0.9–1.1)

## 2013-06-23 NOTE — Telephone Encounter (Signed)
Will forward this to Dr Meda Coffee to make her aware.

## 2013-06-28 ENCOUNTER — Encounter (HOSPITAL_COMMUNITY): Admission: RE | Payer: Self-pay | Source: Ambulatory Visit

## 2013-06-28 ENCOUNTER — Ambulatory Visit (HOSPITAL_COMMUNITY): Admission: RE | Admit: 2013-06-28 | Payer: Medicare Other | Source: Ambulatory Visit | Admitting: Cardiovascular Disease

## 2013-06-28 SURGERY — LEFT HEART CATHETERIZATION WITH CORONARY ANGIOGRAM
Anesthesia: LOCAL

## 2013-07-03 DIAGNOSIS — N186 End stage renal disease: Secondary | ICD-10-CM | POA: Diagnosis not present

## 2013-07-04 ENCOUNTER — Emergency Department (HOSPITAL_COMMUNITY)
Admission: EM | Admit: 2013-07-04 | Discharge: 2013-07-04 | Disposition: A | Discharge status: Home / Self Care | Payer: Medicare Other | Attending: Emergency Medicine | Admitting: Emergency Medicine

## 2013-07-04 ENCOUNTER — Emergency Department (HOSPITAL_COMMUNITY): Payer: Medicare Other

## 2013-07-04 ENCOUNTER — Encounter (HOSPITAL_COMMUNITY): Payer: Self-pay | Admitting: Emergency Medicine

## 2013-07-04 DIAGNOSIS — F411 Generalized anxiety disorder: Secondary | ICD-10-CM | POA: Diagnosis not present

## 2013-07-04 DIAGNOSIS — F17200 Nicotine dependence, unspecified, uncomplicated: Secondary | ICD-10-CM | POA: Insufficient documentation

## 2013-07-04 DIAGNOSIS — Z8619 Personal history of other infectious and parasitic diseases: Secondary | ICD-10-CM | POA: Diagnosis not present

## 2013-07-04 DIAGNOSIS — J449 Chronic obstructive pulmonary disease, unspecified: Secondary | ICD-10-CM | POA: Insufficient documentation

## 2013-07-04 DIAGNOSIS — R079 Chest pain, unspecified: Secondary | ICD-10-CM

## 2013-07-04 DIAGNOSIS — Z8639 Personal history of other endocrine, nutritional and metabolic disease: Secondary | ICD-10-CM | POA: Insufficient documentation

## 2013-07-04 DIAGNOSIS — E119 Type 2 diabetes mellitus without complications: Secondary | ICD-10-CM | POA: Diagnosis not present

## 2013-07-04 DIAGNOSIS — F329 Major depressive disorder, single episode, unspecified: Secondary | ICD-10-CM | POA: Diagnosis not present

## 2013-07-04 DIAGNOSIS — N189 Chronic kidney disease, unspecified: Secondary | ICD-10-CM | POA: Insufficient documentation

## 2013-07-04 DIAGNOSIS — Z79899 Other long term (current) drug therapy: Secondary | ICD-10-CM | POA: Insufficient documentation

## 2013-07-04 DIAGNOSIS — K219 Gastro-esophageal reflux disease without esophagitis: Secondary | ICD-10-CM | POA: Diagnosis not present

## 2013-07-04 DIAGNOSIS — Z8709 Personal history of other diseases of the respiratory system: Secondary | ICD-10-CM

## 2013-07-04 DIAGNOSIS — Z9889 Other specified postprocedural states: Secondary | ICD-10-CM | POA: Insufficient documentation

## 2013-07-04 DIAGNOSIS — J4489 Other specified chronic obstructive pulmonary disease: Secondary | ICD-10-CM | POA: Insufficient documentation

## 2013-07-04 DIAGNOSIS — Z992 Dependence on renal dialysis: Secondary | ICD-10-CM | POA: Diagnosis not present

## 2013-07-04 DIAGNOSIS — Z8739 Personal history of other diseases of the musculoskeletal system and connective tissue: Secondary | ICD-10-CM | POA: Diagnosis not present

## 2013-07-04 DIAGNOSIS — Z862 Personal history of diseases of the blood and blood-forming organs and certain disorders involving the immune mechanism: Secondary | ICD-10-CM | POA: Insufficient documentation

## 2013-07-04 DIAGNOSIS — R9439 Abnormal result of other cardiovascular function study: Secondary | ICD-10-CM | POA: Diagnosis not present

## 2013-07-04 DIAGNOSIS — I129 Hypertensive chronic kidney disease with stage 1 through stage 4 chronic kidney disease, or unspecified chronic kidney disease: Secondary | ICD-10-CM | POA: Insufficient documentation

## 2013-07-04 DIAGNOSIS — D631 Anemia in chronic kidney disease: Secondary | ICD-10-CM | POA: Diagnosis not present

## 2013-07-04 DIAGNOSIS — I10 Essential (primary) hypertension: Secondary | ICD-10-CM | POA: Diagnosis not present

## 2013-07-04 DIAGNOSIS — N186 End stage renal disease: Secondary | ICD-10-CM | POA: Diagnosis not present

## 2013-07-04 DIAGNOSIS — R0789 Other chest pain: Secondary | ICD-10-CM

## 2013-07-04 DIAGNOSIS — R071 Chest pain on breathing: Secondary | ICD-10-CM | POA: Diagnosis not present

## 2013-07-04 DIAGNOSIS — F3289 Other specified depressive episodes: Secondary | ICD-10-CM | POA: Diagnosis not present

## 2013-07-04 DIAGNOSIS — D509 Iron deficiency anemia, unspecified: Secondary | ICD-10-CM | POA: Diagnosis not present

## 2013-07-04 DIAGNOSIS — R0989 Other specified symptoms and signs involving the circulatory and respiratory systems: Secondary | ICD-10-CM | POA: Diagnosis not present

## 2013-07-04 DIAGNOSIS — N2581 Secondary hyperparathyroidism of renal origin: Secondary | ICD-10-CM | POA: Diagnosis not present

## 2013-07-04 HISTORY — DX: Personal history of other diseases of the respiratory system: Z87.09

## 2013-07-04 LAB — CBC WITH DIFFERENTIAL/PLATELET
Basophils Absolute: 0 10*3/uL (ref 0.0–0.1)
Basophils Relative: 0 % (ref 0–1)
Eosinophils Absolute: 0.3 10*3/uL (ref 0.0–0.7)
Eosinophils Relative: 3 % (ref 0–5)
HEMATOCRIT: 31.5 % — AB (ref 39.0–52.0)
HEMOGLOBIN: 10.3 g/dL — AB (ref 13.0–17.0)
LYMPHS ABS: 1.1 10*3/uL (ref 0.7–4.0)
Lymphocytes Relative: 10 % — ABNORMAL LOW (ref 12–46)
MCH: 30.5 pg (ref 26.0–34.0)
MCHC: 32.7 g/dL (ref 30.0–36.0)
MCV: 93.2 fL (ref 78.0–100.0)
MONOS PCT: 8 % (ref 3–12)
Monocytes Absolute: 0.8 10*3/uL (ref 0.1–1.0)
NEUTROS PCT: 79 % — AB (ref 43–77)
Neutro Abs: 8.6 10*3/uL — ABNORMAL HIGH (ref 1.7–7.7)
Platelets: 265 10*3/uL (ref 150–400)
RBC: 3.38 MIL/uL — AB (ref 4.22–5.81)
RDW: 13.7 % (ref 11.5–15.5)
WBC: 10.8 10*3/uL — ABNORMAL HIGH (ref 4.0–10.5)

## 2013-07-04 LAB — I-STAT TROPONIN, ED: Troponin i, poc: 0.02 ng/mL (ref 0.00–0.08)

## 2013-07-04 LAB — I-STAT CHEM 8, ED
BUN: 20 mg/dL (ref 6–23)
CHLORIDE: 92 meq/L — AB (ref 96–112)
Calcium, Ion: 1.11 mmol/L — ABNORMAL LOW (ref 1.12–1.23)
Creatinine, Ser: 7.8 mg/dL — ABNORMAL HIGH (ref 0.50–1.35)
Glucose, Bld: 99 mg/dL (ref 70–99)
HCT: 40 % (ref 39.0–52.0)
HEMOGLOBIN: 13.6 g/dL (ref 13.0–17.0)
POTASSIUM: 3.5 meq/L — AB (ref 3.7–5.3)
SODIUM: 140 meq/L (ref 137–147)
TCO2: 31 mmol/L (ref 0–100)

## 2013-07-04 MED ORDER — MORPHINE SULFATE 4 MG/ML IJ SOLN
6.0000 mg | Freq: Once | INTRAMUSCULAR | Status: AC
  Administered 2013-07-04: 6 mg via INTRAVENOUS
  Filled 2013-07-04: qty 2

## 2013-07-04 MED ORDER — HYDROCODONE-ACETAMINOPHEN 5-325 MG PO TABS: 1.0000 | ORAL_TABLET | Freq: Three times a day (TID) | ORAL | Status: DC | PRN

## 2013-07-04 MED ORDER — CHLORPROMAZINE HCL 25 MG PO TABS
25.0000 mg | ORAL_TABLET | Freq: Once | ORAL | Status: AC
  Administered 2013-07-04: 25 mg via ORAL
  Filled 2013-07-04: qty 1

## 2013-07-04 MED ORDER — METOCLOPRAMIDE HCL 5 MG/ML IJ SOLN
10.0000 mg | Freq: Once | INTRAMUSCULAR | Status: AC
  Administered 2013-07-04: 10 mg via INTRAVENOUS
  Filled 2013-07-04: qty 2

## 2013-07-04 MED ORDER — CYCLOBENZAPRINE HCL 10 MG PO TABS: 10.0000 mg | ORAL_TABLET | Freq: Two times a day (BID) | ORAL | Status: DC | PRN

## 2013-07-04 NOTE — ED Notes (Signed)
Ambulated in hall way 20 feet pulse 100 o2 @ 94

## 2013-07-04 NOTE — ED Notes (Signed)
IV team at the bedside with phlebotomy

## 2013-07-04 NOTE — ED Notes (Signed)
PA student at the bedside.

## 2013-07-04 NOTE — ED Provider Notes (Signed)
Medical screening examination/treatment/procedure(s) were performed by non-physician practitioner and as supervising physician I was immediately available for consultation/collaboration.   EKG Interpretation   Date/Time:  Monday July 04 2013 10:09:42 EDT Ventricular Rate:  94 PR Interval:  205 QRS Duration: 93 QT Interval:  384 QTC Calculation: 480 R Axis:   -9 Text Interpretation:  Sinus rhythm Prolonged PR interval Probable left  atrial enlargement Probable left ventricular hypertrophy Borderline T  abnormalities, lateral leads Borderline prolonged QT interval Baseline  wander in lead(s) II aVR Similar to prior Confirmed by Dina Rich  MD,  Kelby Lotspeich ([id number]) on 07/04/2013 11:00:35 AM       Merryl Hacker, MD 07/04/13 1944

## 2013-07-04 NOTE — ED Notes (Signed)
Admitting MD at the bedside with patient.

## 2013-07-04 NOTE — ED Notes (Signed)
IV attempt x 1 unsuccessful to left arm. Pt has limited access. IV team paged.

## 2013-07-04 NOTE — ED Notes (Signed)
Per [facility], pt from dialysis, was only able to get treatment for 2 hours. Has been working out his right arm and pain started in his right chest and moving down his right arm 2 days ago. Pain is tender with palpation and movement. Took 1 NTG. States the pain is a pressure.

## 2013-07-04 NOTE — Discharge Instructions (Signed)

## 2013-07-04 NOTE — Consult Note (Signed)
CARDIOLOGY CONSULT NOTE   Patient ID: James Wall MRN: [id number], DOB/AGE: 1965-12-04   Admit date: 07/04/2013 Date of Consult: 07/04/2013   Primary Physician: [name], MD Primary Cardiologist: Dr. Meda Coffee  Pt. Profile  48 yo male with PMH of ESRD on dialysis, cirrhosis, HTN, COPD and CAD present with R side chest stiffness radiate to R arm after doing R arm exercise to help R AVF mature. He had a recently cancelled [place] due to unwilling to go through cath without full sedation  Problem List  Past Medical History  Diagnosis Date  . Chronic kidney disease   . Hypertension   . COPD (chronic obstructive pulmonary disease)   . Arthritis     GOUT  . Anemia   . Secondary hyperparathyroidism   . Anxiety   . Depression   . Shortness of breath     With exertion  . GERD (gastroesophageal reflux disease)   . Sepsis 02/2013    from AVF , treated with Vancomycin.    Past Surgical History  Procedure Laterality Date  . Av fistula placement  2009    Left lower arm AVF  . Ligation of arteriovenous  fistula Left 12/22/2012    Procedure: LIGATION OF ARTERIOVENOUS  FISTULA;EXCISION OF LARGE ANEURYSMS;;  Surgeon: Elam Dutch, MD;  Location: [place];  Service: Vascular;  Laterality: Left;  . Insertion of dialysis catheter N/A 12/23/2012    Procedure: INSERTION OF DIALYSIS CATHETER; ULTRASOUND GUIDED;  Surgeon: Angelia Mould, MD;  Location: [place];  Service: Vascular;  Laterality: N/A;  . Av fistula placement Right 02/22/2013    Procedure:  CREATION  OF BRACHIAL CEPHALIC FISTULA RIGHT ARM;  Surgeon: Elam Dutch, MD;  Location: [place];  Service: Vascular;  Laterality: Right;  . Esophagogastroduodenoscopy (egd) with propofol N/A 04/12/2013    Procedure: ESOPHAGOGASTRODUODENOSCOPY (EGD) WITH PROPOFOL;  Surgeon: Arta Silence, MD;  Location: [facility];  Service: Endoscopy;  Laterality: N/A;     Allergies  Allergies  Allergen Reactions  . Aspirin     Tends  to make him "feel worse" when he tries to take it for a cold or something    HPI   The patient is a 48 year old [place] American male with past medical history significant for hypertension, end-stage renal disease on HD MWF, COPD and history of coronary disease. The patient also has a diagnosis of liver cirrhosis of unknown etiology. His last echo on 12/23/2012 showed preserved EF of 60-65%. She was seen by Dr. Meda Coffee in the clinic on 05/31/2013 complaining of fatigue, shortness of breath with minimal exertion, and weight loss. He also experienced palpitations associated with shortness of breath at the end of his dialysis for which he take PRN metoprolol. EKG was abnormal for T wave inversion in lateral leads. Given his symptoms, a 2 day lexiscan stress test was obtained on 06/16/2013 which showed mild decreased uptake of moderate-sized along the anterior wall distribution, mid to distal anterior wall ischemia. Patient was originally scheduled to undergo outpatient cardiac catheterization on 06/28/2013 however after extensive discussion of regarding we prefer to go from the femoral approach given his h/o bilateral UE AVF, patient declined to undergo the cardiac cath without full anesthesia because his past bad experience of experiencing pain despite moderate sedation. The procedure was eventually canceled.   Of note, patient also had a left arm AV fistula placed into LUE in 2009, however the left arm AV fistula was excised in November 2014 secondary to MRSA infection and  aneurysm. A new AV fistula was placed in the right forearm. Patient has been doing exercise to help with the maturation of the right forearm AV fistula. He states he was trying to lift up a can of chili beans on 07/01/2013. He think he may have overdid himself because starting on 07/02/2013, he began to experiencing right chest stiffness radiating down the right arm. He also experienced some fatigue, weakness, and a productive cough with thick  whitish stringy sputum. He says for the past 3 days, he has been using his left arm to lift things as every time he used right arm he felt increasing stiffness. Lifting the right arm causes worsening pain, however the pain does not subside no matter which side he sleeps on. The stiffness and pain never went away, however it was wax and waning. He states he barely slept in the past 24 hours. Today while 2 hours into hemodialysis, his symptom was worsening, therefore he was requested to transfer to [facility] ED for further evaluation.  On arrival to [facility] ED, he was noted to be normotensive with systolic blood pressure of 110 to 140. He was also tachycardic with heart rate in the 90s. There was no sign of fever, however WBC was elevated at 10. Initial troponin was negative. Hemoglobin stable at 10.3. Potassium 3.5. Cr severely elevated at 7.8. Cardiology was consulted for chest pain.  Inpatient Medications    Family History Family History  Problem Relation Age of Onset  . Stroke Father      Social History History   Social History  . Marital Status: Single    Spouse Name: N/A    Number of Children: 0  . Years of Education: 12th   Occupational History  . n/a    Social History Main Topics  . Smoking status: Current Every Day Smoker -- 0.50 packs/day for 16 years    Types: Cigarettes  . Smokeless tobacco: Never Used  . Alcohol Use: Yes     Comment: occassional  . Drug Use: No  . Sexual Activity: Not on file   Other Topics Concern  . Not on file   Social History Narrative  . No narrative on file     Review of Systems  General:  No fever, night sweats. + chill, no fever Cardiovascular:  No edema, orthopnea, palpitations, paroxysmal nocturnal dyspnea. + R sided chest stiffness and SOB on exertion.  Dermatological: No rash, lesions/masses Respiratory: No cough, dyspnea Urologic: No hematuria, dysuria Abdominal:   No vomiting, diarrhea, bright red blood per rectum, melena,  or hematemesis. Occasional nausea, none at this point. Believe he has GI problem, but no pain or distension at this point Neurologic:  No visual changes, changes in mental status. Weakness over R arm, could not lift thing. But can lift things with L arm without sx All other systems reviewed and are otherwise negative except as noted above.  Physical Exam  Blood pressure 131/84, pulse 97, temperature 97.7 F (36.5 C), temperature source Oral, resp. rate 26, height 5' 6"  (1.676 m), weight 165 lb (74.844 kg), SpO2 93.00%.  General: Pleasant, NAD. Tearful Psych: Normal affect. Neuro: Alert and oriented X 3. Moves all extremities spontaneously. HEENT: Normal  Neck: Supple without bruits or JVD. Lungs:  Resp regular and unlabored, CTA. Heart: RRR no s3, s4. +1/6 systolic murmur. 2/6 R subclavian murmur. Loud sharp R cubital murmur with thrill on palpation over R AVF.   Abdomen: Soft, non-tender, non-distended, BS + x 4.  Extremities: No clubbing, cyanosis or edema. Radials 2+ and equal bilaterally. DP pulse 2+ on RLE, 1+ on LLE.  Labs  No results found for this basename: CKTOTAL, CKMB, TROPONINI,  in the last 72 hours Lab Results  Component Value Date   WBC 10.8* 07/04/2013   HGB 13.6 07/04/2013   HCT 40.0 07/04/2013   MCV 93.2 07/04/2013   PLT 265 07/04/2013    Recent Labs Lab 07/04/13 1145  NA 140  K 3.5*  CL 92*  BUN 20  CREATININE 7.80*  GLUCOSE 99   No results found for this basename: CHOL, HDL, LDLCALC, TRIG   No results found for this basename: DDIMER    Radiology/Studies  Dg Chest 2 View  07/04/2013   CLINICAL DATA:  Chest pain and shortness of breath  EXAM: CHEST  2 VIEW  COMPARISON:  04/19/2013  FINDINGS: Cardiac shadow is stable. A left-sided dialysis catheter is again seen. The area mild vascular congestion is noted. No focal infiltrate or sizable effusion is seen.  IMPRESSION: Mild vascular congestion.  No focal edema is noted.   Electronically Signed   By: Inez Catalina  M.D.   On: 07/04/2013 11:26    ECG  Sinus rhythm with HR 90s, t wave inversion in lateral leads.  ASSESSMENT AND PLAN  1. R sided chest pain after R arm exercise  - atypical for ACS consider he can move L arm without significant sx and pt states the pain is different from what he experience prior to previous stress test  - may need to be evaluated by vascular to r/o steal syndrome  - may have musculoskeletal component  - may need LHC in the future with abnormal stress test, however current symptom atypical, after discussing with Pt, he still does not want LHC without general anesthesia.  2. Possible CAD with mid to apical anterior ischemia seen on recent lexiscan  - originally had LHC scheduled for 06/28/2013, cancelled due to unwillingness to go through the procedure without anesthesia  3. ESRD on HD M,W, F 4. HTN, on Imdur, norvasc, clonidine, lisinopril, metoprolol 5. COPD    Signed, Almyra Deforest, PA-C 07/04/2013, 2:23 PM  Patient seen, examined. Available data reviewed. Agree with findings, assessment, and plan as outlined by Almyra Deforest, PA-C. The patient was independently interviewed and examined. I have reviewed his lab and radiographic data. On exam, he is tearful at times. JVP is normal. There is no peripheral edema. Heart is RRR with a grade 2/6 systolic murmur at the RUSB. Lungs are CTAB. Abdomen with mild diffuse tenderness but no R/G. He has tenderness over the right chest wall.   In summary, he has highly atypical chest and right arm pain, likely related to muscle strain. He has done a lot of arm/hand exercises over preceding days and thinks he 'overdid it.' I do not think further inpatient cardiac evaluation is indicated.   I discussed the idea of cardiac cath with him and advised this could be done without too much discomfort with liberal use of versed of fentanyl. I think the risk of general anesthesia is greater than the risk of a diagnostic cath and I would not recommend  this. He is not currently interested in pursuing cath, despite his abnormal stress test, because of multiple other medical problems. I advised him to call anytime if he changes his mind. He follows with Dr Meda Coffee.  Sherren Mocha, M.D. 07/04/2013 2:51 PM

## 2013-07-04 NOTE — ED Provider Notes (Signed)
CSN: [id number]     Arrival date & time 07/04/13  1004 History   First MD Initiated Contact with Patient 07/04/13 1005     Chief Complaint  Patient presents with  . Chest Pain     (Consider location/radiation/quality/duration/timing/severity/associated sxs/prior Treatment) HPI  48 year old male with history of end-stage renal disease currently on dialysis, hypertension, COPD CAD, was brought in EMS from dialysis for evaluation of chest pain. Patient is a Monday Wednesday Friday dialysis. He was in the middle of dialysis today in 2 hours into it and he request to be sent to ER for management of chest discomfort. Patient reports he has been doing exercise with his right arm by lifting up a can of soup repeatedly for 2 days. Today after that which is 3 days ago he endorsed having sharp pain to his right side of his chest radiates to his right arm. Pain has been persistent, with occasional shortness of breath, worsening with palpation and movement. He also reported having some runny nose, nonproductive cough for the past week.  Also reports chills after dialysis. He took over-the-counter medication including Tylenol ibuprofen with minimal relief. He needs the pain is related to his recent exercise but because of the prolonged ration in the severity of it he is here for further evaluation. He also reports having chest discomfort several weeks ago. He has a cardiac workup including coronary CT scan that show mouth moderate ischemic changes. He was scheduled to have a heart catheterization done last week however the patient declined stating that he prefers to be put to sleep in order to have the procedure done. Patient felt his current pain is different from his previous chest pain. Patient also reports having recurrent hiccups for the past 2-3 weeks. States he can usually happen after dialysis, and this is new for him. Patient now reports feeling weak and tired.   Past Medical History  Diagnosis Date  .  Chronic kidney disease   . Hypertension   . COPD (chronic obstructive pulmonary disease)   . Arthritis     GOUT  . Anemia   . Secondary hyperparathyroidism   . Anxiety   . Depression   . Shortness of breath     With exertion  . GERD (gastroesophageal reflux disease)   . Sepsis 02/2013    from AVF , treated with Vancomycin.   Past Surgical History  Procedure Laterality Date  . Av fistula placement  2009    Left lower arm AVF  . Ligation of arteriovenous  fistula Left 12/22/2012    Procedure: LIGATION OF ARTERIOVENOUS  FISTULA;EXCISION OF LARGE ANEURYSMS;;  Surgeon: Elam Dutch, MD;  Location: [place];  Service: Vascular;  Laterality: Left;  . Insertion of dialysis catheter N/A 12/23/2012    Procedure: INSERTION OF DIALYSIS CATHETER; ULTRASOUND GUIDED;  Surgeon: Angelia Mould, MD;  Location: [place];  Service: Vascular;  Laterality: N/A;  . Av fistula placement Right 02/22/2013    Procedure:  CREATION  OF BRACHIAL CEPHALIC FISTULA RIGHT ARM;  Surgeon: Elam Dutch, MD;  Location: [place];  Service: Vascular;  Laterality: Right;  . Esophagogastroduodenoscopy (egd) with propofol N/A 04/12/2013    Procedure: ESOPHAGOGASTRODUODENOSCOPY (EGD) WITH PROPOFOL;  Surgeon: Arta Silence, MD;  Location: [facility];  Service: Endoscopy;  Laterality: N/A;   Family History  Problem Relation Age of Onset  . Stroke Father    History  Substance Use Topics  . Smoking status: Current Every Day Smoker -- 0.50 packs/day for 16  years    Types: Cigarettes  . Smokeless tobacco: Never Used  . Alcohol Use: Yes     Comment: occassional    Review of Systems  All other systems reviewed and are negative.     Allergies  Aspirin  Home Medications   Prior to Admission medications   Medication Sig Start Date End Date Taking? Authorizing Provider  ALPRAZolam Duanne Moron) 1 MG tablet Take 1 mg by mouth at bedtime as needed for anxiety.    Historical Provider, MD  amLODipine (NORVASC) 10 MG  tablet Take 10 mg by mouth at bedtime.     Historical Provider, MD  atorvastatin (LIPITOR) 20 MG tablet Take 1 tablet (20 mg total) by mouth daily. 06/21/13   Dorothy Spark, MD  cloNIDine (CATAPRES - DOSED IN MG/24 HR) 0.3 mg/24hr Place 1 patch onto the skin once a week. Saturday.    Historical Provider, MD  esomeprazole (NEXIUM) 40 MG capsule Take 40-80 mg by mouth daily as needed (reflux/indigestion. Dose varies depending on severity.).     Historical Provider, MD  isosorbide mononitrate (IMDUR) 30 MG 24 hr tablet Take 1 tablet (30 mg total) by mouth daily. 06/21/13   Dorothy Spark, MD  lisinopril (PRINIVIL,ZESTRIL) 40 MG tablet Take 40 mg by mouth at bedtime.     Historical Provider, MD  metoprolol succinate (TOPROL-XL) 25 MG 24 hr tablet Take 1 tablet (25 mg total) by mouth daily. 05/31/13   Dorothy Spark, MD  sevelamer carbonate (RENVELA) 800 MG tablet Take 2,400-5,600 mg by mouth 3 (three) times daily with meals. Takes 3-4 tablets if the meal is light and 6-7 tablets if he eats a big meal.       Ht 5' 6"  (1.676 m)  Wt 165 lb (74.844 kg)  BMI 26.64 kg/m2 Physical Exam  Nursing note and vitals reviewed. Constitutional: He is oriented to person, place, and time. He appears well-developed and well-nourished.  The patient appears uncomfortable however in no acute respiratory distress  HENT:  Nose: Rhinorrhea noted    Eyes: Conjunctivae are normal.  Neck: Neck supple. No JVD present.  Cardiovascular: Normal rate and regular rhythm.  Exam reveals no gallop and no friction rub.   No murmur heard. Pulmonary/Chest: Effort normal and breath sounds normal. He has no wheezes. He has no rales. He exhibits tenderness (Tenderness to right anterior chest wall on palpation without overlying skin changes, emphysema, or crepitus).  Abdominal: Soft. There is no tenderness.  Musculoskeletal: He exhibits tenderness (Tenderness throughout right arm without focal point tenderness, or deformity.). He  exhibits no edema.  Neurological: He is alert and oriented to person, place, and time.  Skin: Skin is warm. No rash noted.  Psychiatric: He has a normal mood and affect.    ED Course  Procedures (including critical care time)  10:45 AM Patient presents with persistent right anterior chest wall pain with right arm pain that is likely muscular skeletal. However he has significant cardiac risk factors and had a recent coronary CT scan that showed mild/moderate ischemic changes requiring heart catheterization which he has declined. Plan to workup for cardiac disease, treated symptoms, and we'll continue to monitor.  Care discussed with Dr. Dina Rich.   12:13 PM Patient continued to endorse reproducible chest pain. His workup he has been very unremarkable. Chest x-ray shows mild vascular congestion but no focal infiltrates or pulmonary edema. EKG without acute ischemic changes, and troponin is negative. Labs otherwise reassuring with evidence of end-stage renal disease however his  potassium level is normal. Recent coronary CT scan performed on March 14 shows Intermediate risk stress nuclear study with mid to distal anterior wall ischemia.  We'll continue to manage patient's symptoms and will consult cardiologist for further management  12:17 PM I have consulted with Cardiology, who will see pt in ER for further management.    2:22 PM Cardiologist, Dr. Burt Knack has evaluated pt and felt pt stable to be discharge home.  He does not think pt's presenting sxs is cardiac in nature.   3:04 PM Pt will be discharge with pain medication and muscle relaxant for his MSK pain.  Pt to f/u closely with his PCP for further care.  Return precaution discussed.    Labs Review Labs Reviewed  CBC WITH DIFFERENTIAL - Abnormal; Notable for the following:    WBC 10.8 (*)    RBC 3.38 (*)    Hemoglobin 10.3 (*)    HCT 31.5 (*)    Neutrophils Relative % 79 (*)    Neutro Abs 8.6 (*)    Lymphocytes Relative 10 (*)     All other components within normal limits  I-STAT CHEM 8, ED - Abnormal; Notable for the following:    Potassium 3.5 (*)    Chloride 92 (*)    Creatinine, Ser 7.80 (*)    Calcium, Ion 1.11 (*)    All other components within normal limits  I-STAT TROPOININ, ED    Imaging Review Dg Chest 2 View  07/04/2013   CLINICAL DATA:  Chest pain and shortness of breath  EXAM: CHEST  2 VIEW  COMPARISON:  04/19/2013  FINDINGS: Cardiac shadow is stable. A left-sided dialysis catheter is again seen. The area mild vascular congestion is noted. No focal infiltrate or sizable effusion is seen.  IMPRESSION: Mild vascular congestion.  No focal edema is noted.   Electronically Signed   By: Inez Catalina M.D.   On: 07/04/2013 11:26     EKG Interpretation None      Date: 07/04/2013  Rate: 94    Rhythm: normal sinus rhythm  QRS Axis: normal  Intervals: QT prolonged and borderline prolonged PR interval  ST/T Wave abnormalities: nonspecific T wave changes  Conduction Disutrbances:none  Narrative Interpretation:   Old EKG Reviewed: unchanged    MDM   Final diagnoses:  Right-sided chest wall pain    BP 131/91  Pulse 95  Temp(Src) 97.7 F (36.5 C) (Oral)  Resp 12  Ht 5' 6"  (1.676 m)  Wt 165 lb (74.844 kg)  BMI 26.64 kg/m2  SpO2 94%  I have reviewed nursing notes and vital signs. I personally reviewed the imaging tests through PACS system  I reviewed available ER/hospitalization records thought the EMR     Domenic Moras, PA-C 07/04/13 1505

## 2013-07-08 ENCOUNTER — Encounter (HOSPITAL_COMMUNITY): Payer: Self-pay | Admitting: Emergency Medicine

## 2013-07-08 ENCOUNTER — Emergency Department (HOSPITAL_COMMUNITY): Payer: Medicare Other

## 2013-07-08 ENCOUNTER — Inpatient Hospital Stay (HOSPITAL_COMMUNITY)
Admission: EM | Admit: 2013-07-08 | Discharge: 2013-07-14 | DRG: 193 | Disposition: A | Discharge status: Home / Self Care | Payer: Medicare Other | Attending: Internal Medicine | Admitting: Internal Medicine

## 2013-07-08 DIAGNOSIS — I12 Hypertensive chronic kidney disease with 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: Secondary | ICD-10-CM | POA: Diagnosis present

## 2013-07-08 DIAGNOSIS — J449 Chronic obstructive pulmonary disease, unspecified: Secondary | ICD-10-CM | POA: Diagnosis present

## 2013-07-08 DIAGNOSIS — Z79899 Other long term (current) drug therapy: Secondary | ICD-10-CM | POA: Diagnosis not present

## 2013-07-08 DIAGNOSIS — N2581 Secondary hyperparathyroidism of renal origin: Secondary | ICD-10-CM | POA: Diagnosis present

## 2013-07-08 DIAGNOSIS — J9600 Acute respiratory failure, unspecified whether with hypoxia or hypercapnia: Secondary | ICD-10-CM | POA: Diagnosis present

## 2013-07-08 DIAGNOSIS — I2510 Atherosclerotic heart disease of native coronary artery without angina pectoris: Secondary | ICD-10-CM | POA: Diagnosis present

## 2013-07-08 DIAGNOSIS — I509 Heart failure, unspecified: Secondary | ICD-10-CM | POA: Diagnosis present

## 2013-07-08 DIAGNOSIS — R7989 Other specified abnormal findings of blood chemistry: Secondary | ICD-10-CM

## 2013-07-08 DIAGNOSIS — R9430 Abnormal result of cardiovascular function study, unspecified: Secondary | ICD-10-CM | POA: Diagnosis present

## 2013-07-08 DIAGNOSIS — J189 Pneumonia, unspecified organism: Secondary | ICD-10-CM | POA: Diagnosis not present

## 2013-07-08 DIAGNOSIS — R9439 Abnormal result of other cardiovascular function study: Secondary | ICD-10-CM | POA: Diagnosis present

## 2013-07-08 DIAGNOSIS — K219 Gastro-esophageal reflux disease without esophagitis: Secondary | ICD-10-CM | POA: Diagnosis not present

## 2013-07-08 DIAGNOSIS — D509 Iron deficiency anemia, unspecified: Secondary | ICD-10-CM | POA: Diagnosis not present

## 2013-07-08 DIAGNOSIS — R000 Tachycardia, unspecified: Secondary | ICD-10-CM

## 2013-07-08 DIAGNOSIS — F329 Major depressive disorder, single episode, unspecified: Secondary | ICD-10-CM | POA: Diagnosis present

## 2013-07-08 DIAGNOSIS — R791 Abnormal coagulation profile: Secondary | ICD-10-CM | POA: Diagnosis not present

## 2013-07-08 DIAGNOSIS — J9601 Acute respiratory failure with hypoxia: Secondary | ICD-10-CM | POA: Diagnosis present

## 2013-07-08 DIAGNOSIS — I10 Essential (primary) hypertension: Secondary | ICD-10-CM

## 2013-07-08 DIAGNOSIS — S4980XA Other specified injuries of shoulder and upper arm, unspecified arm, initial encounter: Secondary | ICD-10-CM | POA: Diagnosis not present

## 2013-07-08 DIAGNOSIS — F411 Generalized anxiety disorder: Secondary | ICD-10-CM | POA: Diagnosis present

## 2013-07-08 DIAGNOSIS — D631 Anemia in chronic kidney disease: Secondary | ICD-10-CM | POA: Diagnosis present

## 2013-07-08 DIAGNOSIS — Z91199 Patient's noncompliance with other medical treatment and regimen due to unspecified reason: Secondary | ICD-10-CM | POA: Diagnosis not present

## 2013-07-08 DIAGNOSIS — Z992 Dependence on renal dialysis: Secondary | ICD-10-CM

## 2013-07-08 DIAGNOSIS — R079 Chest pain, unspecified: Secondary | ICD-10-CM | POA: Diagnosis present

## 2013-07-08 DIAGNOSIS — J4489 Other specified chronic obstructive pulmonary disease: Secondary | ICD-10-CM | POA: Diagnosis present

## 2013-07-08 DIAGNOSIS — F3289 Other specified depressive episodes: Secondary | ICD-10-CM | POA: Diagnosis present

## 2013-07-08 DIAGNOSIS — R0989 Other specified symptoms and signs involving the circulatory and respiratory systems: Secondary | ICD-10-CM | POA: Diagnosis not present

## 2013-07-08 DIAGNOSIS — Z9119 Patient's noncompliance with other medical treatment and regimen: Secondary | ICD-10-CM

## 2013-07-08 DIAGNOSIS — R0902 Hypoxemia: Secondary | ICD-10-CM | POA: Diagnosis not present

## 2013-07-08 DIAGNOSIS — F17200 Nicotine dependence, unspecified, uncomplicated: Secondary | ICD-10-CM | POA: Diagnosis present

## 2013-07-08 DIAGNOSIS — N189 Chronic kidney disease, unspecified: Secondary | ICD-10-CM | POA: Diagnosis present

## 2013-07-08 DIAGNOSIS — M25519 Pain in unspecified shoulder: Secondary | ICD-10-CM | POA: Diagnosis not present

## 2013-07-08 DIAGNOSIS — E119 Type 2 diabetes mellitus without complications: Secondary | ICD-10-CM | POA: Diagnosis not present

## 2013-07-08 DIAGNOSIS — K7460 Unspecified cirrhosis of liver: Secondary | ICD-10-CM | POA: Diagnosis present

## 2013-07-08 DIAGNOSIS — E8779 Other fluid overload: Secondary | ICD-10-CM | POA: Diagnosis present

## 2013-07-08 DIAGNOSIS — N186 End stage renal disease: Secondary | ICD-10-CM | POA: Diagnosis present

## 2013-07-08 DIAGNOSIS — D638 Anemia in other chronic diseases classified elsewhere: Secondary | ICD-10-CM | POA: Diagnosis present

## 2013-07-08 DIAGNOSIS — D630 Anemia in neoplastic disease: Secondary | ICD-10-CM | POA: Diagnosis present

## 2013-07-08 DIAGNOSIS — I200 Unstable angina: Secondary | ICD-10-CM

## 2013-07-08 DIAGNOSIS — R0602 Shortness of breath: Secondary | ICD-10-CM | POA: Diagnosis not present

## 2013-07-08 DIAGNOSIS — N039 Chronic nephritic syndrome with unspecified morphologic changes: Secondary | ICD-10-CM | POA: Diagnosis not present

## 2013-07-08 DIAGNOSIS — J811 Chronic pulmonary edema: Secondary | ICD-10-CM | POA: Diagnosis not present

## 2013-07-08 DIAGNOSIS — G4730 Sleep apnea, unspecified: Secondary | ICD-10-CM

## 2013-07-08 DIAGNOSIS — J45909 Unspecified asthma, uncomplicated: Secondary | ICD-10-CM

## 2013-07-08 DIAGNOSIS — S46909A Unspecified injury of unspecified muscle, fascia and tendon at shoulder and upper arm level, unspecified arm, initial encounter: Secondary | ICD-10-CM | POA: Diagnosis not present

## 2013-07-08 HISTORY — DX: Disorder of kidney and ureter, unspecified: N28.9

## 2013-07-08 HISTORY — DX: End stage renal disease: N18.6

## 2013-07-08 HISTORY — DX: Dependence on renal dialysis: Z99.2

## 2013-07-08 LAB — CBC WITH DIFFERENTIAL/PLATELET
Basophils Absolute: 0 10*3/uL (ref 0.0–0.1)
Basophils Relative: 0 % (ref 0–1)
Eosinophils Absolute: 0.2 10*3/uL (ref 0.0–0.7)
Eosinophils Relative: 2 % (ref 0–5)
HEMATOCRIT: 30.6 % — AB (ref 39.0–52.0)
Hemoglobin: 9.8 g/dL — ABNORMAL LOW (ref 13.0–17.0)
LYMPHS PCT: 11 % — AB (ref 12–46)
Lymphs Abs: 1 10*3/uL (ref 0.7–4.0)
MCH: 29.8 pg (ref 26.0–34.0)
MCHC: 32 g/dL (ref 30.0–36.0)
MCV: 93 fL (ref 78.0–100.0)
MONO ABS: 0.8 10*3/uL (ref 0.1–1.0)
Monocytes Relative: 8 % (ref 3–12)
NEUTROS ABS: 7.5 10*3/uL (ref 1.7–7.7)
Neutrophils Relative %: 79 % — ABNORMAL HIGH (ref 43–77)
PLATELETS: 276 10*3/uL (ref 150–400)
RBC: 3.29 MIL/uL — ABNORMAL LOW (ref 4.22–5.81)
RDW: 14.2 % (ref 11.5–15.5)
WBC: 9.5 10*3/uL (ref 4.0–10.5)

## 2013-07-08 LAB — COMPREHENSIVE METABOLIC PANEL
ALK PHOS: 83 U/L (ref 39–117)
ALT: 5 U/L (ref 0–53)
AST: 13 U/L (ref 0–37)
Albumin: 3.2 g/dL — ABNORMAL LOW (ref 3.5–5.2)
BUN: 24 mg/dL — ABNORMAL HIGH (ref 6–23)
CO2: 27 meq/L (ref 19–32)
Calcium: 9.8 mg/dL (ref 8.4–10.5)
Chloride: 90 mEq/L — ABNORMAL LOW (ref 96–112)
Creatinine, Ser: 8.17 mg/dL — ABNORMAL HIGH (ref 0.50–1.35)
GFR calc Af Amer: 8 mL/min — ABNORMAL LOW (ref >90)
GFR, EST NON AFRICAN AMERICAN: 7 mL/min — AB (ref >90)
Glucose, Bld: 101 mg/dL — ABNORMAL HIGH (ref 70–99)
POTASSIUM: 4.2 meq/L (ref 3.7–5.3)
Sodium: 137 mEq/L (ref 137–147)
Total Bilirubin: 1.2 mg/dL (ref 0.3–1.2)
Total Protein: 8.6 g/dL — ABNORMAL HIGH (ref 6.0–8.3)

## 2013-07-08 LAB — I-STAT CG4 LACTIC ACID, ED: Lactic Acid, Venous: 1.15 mmol/L (ref 0.5–2.2)

## 2013-07-08 LAB — I-STAT TROPONIN, ED: Troponin i, poc: 0.03 ng/mL (ref 0.00–0.08)

## 2013-07-08 MED ORDER — ONDANSETRON 4 MG PO TBDP
4.0000 mg | ORAL_TABLET | Freq: Once | ORAL | Status: AC
  Administered 2013-07-08: 4 mg via ORAL
  Filled 2013-07-08: qty 1

## 2013-07-08 MED ORDER — SODIUM CHLORIDE 0.9 % IV BOLUS (SEPSIS)
500.0000 mL | Freq: Once | INTRAVENOUS | Status: AC
  Administered 2013-07-08: 500 mL via INTRAVENOUS

## 2013-07-08 NOTE — ED Notes (Signed)
Patient back from x-ray 

## 2013-07-08 NOTE — ED Notes (Signed)
Increased O2 to 4L.

## 2013-07-08 NOTE — ED Notes (Signed)
Attempted to draw pts labs. Was unsuccessful. Spoke with phlebotomy.

## 2013-07-08 NOTE — ED Notes (Addendum)
Pt requesting blanket...advised him we should hold off due to his recent fever.  Requesting something for pain.

## 2013-07-08 NOTE — ED Notes (Signed)
IV team at bedside 

## 2013-07-08 NOTE — ED Notes (Signed)
IV team paged.  

## 2013-07-08 NOTE — ED Provider Notes (Signed)
I saw and evaluated the patient, reviewed the resident's note and I agree with the findings and plan.   EKG Interpretation   Date/Time:  Friday July 08 2013 12:54:23 EDT Ventricular Rate:  113 PR Interval:  142 QRS Duration: 84 QT Interval:  296 QTC Calculation: 406 R Axis:   -33 Text Interpretation:  Sinus tachycardia Possible Left atrial enlargement  Left axis deviation Nonspecific T wave abnormality Compared to previous  tracing Rate faster Confirmed by [facility]  MD, Jenny Reichmann ([id number]) on 07/08/2013  3:17:44 PM       Vague CP/SOB/general weakness 24/7 last 2 weeks; lungs clear; tachycardic and hypoxic uncertain etiology; cannot CTA in ED anticipate VQ in AM  Procedure: Right external jugular IV access; indication lack of IV access despite nursing IV tries an emergency nurses placing 2 IVs which failed; timeout taken sterile technique used 20-gauge 1 inch Angiocath used to start an IV to right external jugular vein with good aspiration of blood good saline flush patient tolerated procedure well no apparent immediate complications    Babette Relic, MD 07/11/13 2329

## 2013-07-08 NOTE — ED Provider Notes (Signed)
CSN: [id number]     Arrival date & time 07/08/13  1245 History   None    Chief Complaint  Patient presents with  . Fever  . Chest Pain     (Consider location/radiation/quality/duration/timing/severity/associated sxs/prior Treatment) HPI  48 yo M with hx of ESRD (dialysis MWF), cirrhosis of unknown etiology presenting with multiple complains. Has felt short of breath and has chest pain 24/7 every day for the last two weeks. He has also complained of generalized weakness. He fell and Sunday, and hurt his knees when he fell. He did not lose consciousness but did lose his balance causing him to fall. Not hurt his head or neck during the fall. He endorses increased coughing and sputum production over the last few days. He vomited earlier today, vomit was bilious. Has not had any diarrhea. Also complains of pain in his right arm for several days, which is worse with movement. Denies any focal weakness or numbness, just endorses generalized weakness. Denies having any lower extremity edema. He is not on oxygen at home, and does not use any inhalers chronically. He uses a cane for ambulation. He lives with his 32 year old mother. Has stable chronic abdominal pain.  He recently had nuclear stress testing, which was of intermediate risk. Cardiologist wanted to do a cath to further evaluate his coronary arteries, the patient refused to have this done if he cannot undergo general anesthesia.   He gets dialysis on MWF and missed dialysis 2 days ago, but did get it today for his usual length of time. States his dry weight is around 166-167 pounds. He does not know what his weight was earlier today prior to dialysis, or how much fluid they took off. He does still make some urine.   Past Medical History  Diagnosis Date  . Chronic kidney disease   . Hypertension   . COPD (chronic obstructive pulmonary disease)   . Arthritis     GOUT  . Anemia   . Secondary hyperparathyroidism   . Anxiety   . Depression    . Shortness of breath     With exertion  . GERD (gastroesophageal reflux disease)   . Sepsis 02/2013    from AVF , treated with Vancomycin.   Past Surgical History  Procedure Laterality Date  . Av fistula placement  2009    Left lower arm AVF  . Ligation of arteriovenous  fistula Left 12/22/2012    Procedure: LIGATION OF ARTERIOVENOUS  FISTULA;EXCISION OF LARGE ANEURYSMS;;  Surgeon: Elam Dutch, MD;  Location: [place];  Service: Vascular;  Laterality: Left;  . Insertion of dialysis catheter N/A 12/23/2012    Procedure: INSERTION OF DIALYSIS CATHETER; ULTRASOUND GUIDED;  Surgeon: Angelia Mould, MD;  Location: [place];  Service: Vascular;  Laterality: N/A;  . Av fistula placement Right 02/22/2013    Procedure:  CREATION  OF BRACHIAL CEPHALIC FISTULA RIGHT ARM;  Surgeon: Elam Dutch, MD;  Location: [place];  Service: Vascular;  Laterality: Right;  . Esophagogastroduodenoscopy (egd) with propofol N/A 04/12/2013    Procedure: ESOPHAGOGASTRODUODENOSCOPY (EGD) WITH PROPOFOL;  Surgeon: Arta Silence, MD;  Location: [facility];  Service: Endoscopy;  Laterality: N/A;   Family History  Problem Relation Age of Onset  . Stroke Father    History  Substance Use Topics  . Smoking status: Current Every Day Smoker -- 0.50 packs/day for 16 years    Types: Cigarettes  . Smokeless tobacco: Never Used  . Alcohol Use: Yes  Comment: occassional    Review of Systems  Constitutional: Positive for chills and fatigue.  Respiratory: Positive for cough and shortness of breath.   Cardiovascular: Positive for chest pain. Negative for leg swelling.  Gastrointestinal: Positive for vomiting and abdominal pain. Negative for diarrhea.  Musculoskeletal: Positive for arthralgias. Negative for neck pain.  Neurological: Positive for weakness.  All other systems reviewed and are negative.     Allergies  Aspirin  Home Medications   Prior to Admission medications   Medication Sig Start Date  End Date Taking? Authorizing Provider  ALPRAZolam Duanne Moron) 1 MG tablet Take 1 mg by mouth at bedtime as needed for anxiety.   Yes Historical Provider, MD  amLODipine (NORVASC) 10 MG tablet Take 10 mg by mouth at bedtime.    Yes Historical Provider, MD  atorvastatin (LIPITOR) 20 MG tablet Take 1 tablet (20 mg total) by mouth daily. 06/21/13  Yes Dorothy Spark, MD  cloNIDine (CATAPRES - DOSED IN MG/24 HR) 0.3 mg/24hr Place 1 patch onto the skin once a week. Saturday.   Yes Historical Provider, MD  cyclobenzaprine (FLEXERIL) 10 MG tablet Take 1 tablet (10 mg total) by mouth 2 (two) times daily as needed for muscle spasms. 07/04/13  Yes Domenic Moras, PA-C  esomeprazole (NEXIUM) 40 MG capsule Take 40-80 mg by mouth daily as needed (reflux/indigestion. Dose varies depending on severity.).    Yes Historical Provider, MD  gabapentin (NEURONTIN) 100 MG capsule Take 100 mg by mouth 3 (three) times daily.  05/05/13  Yes Historical Provider, MD  HYDROcodone-acetaminophen (NORCO/VICODIN) 5-325 MG per tablet Take 1 tablet by mouth 3 (three) times daily as needed for moderate pain or severe pain. 07/04/13  Yes Domenic Moras, PA-C  isosorbide mononitrate (IMDUR) 30 MG 24 hr tablet Take 1 tablet (30 mg total) by mouth daily. 06/21/13  Yes Dorothy Spark, MD  lisinopril (PRINIVIL,ZESTRIL) 40 MG tablet Take 40 mg by mouth at bedtime.    Yes Historical Provider, MD  metoprolol succinate (TOPROL-XL) 25 MG 24 hr tablet Take 1 tablet (25 mg total) by mouth daily. 05/31/13  Yes Dorothy Spark, MD  sevelamer carbonate (RENVELA) 800 MG tablet Take 2,400-5,600 mg by mouth 3 (three) times daily with meals. Takes 3-4 tablets if the meal is light and 6-7 tablets if he eats a big meal.   Yes    BP 204/92  Pulse 114  Temp(Src) 99.3 F (37.4 C) (Oral)  Resp 26  SpO2 93% Physical Exam  Constitutional: He is oriented to person, place, and time. He appears well-developed and well-nourished.  HENT:  Head: Normocephalic and atraumatic.   Mouth/Throat: Mucous membranes are dry.  Cardiovascular: Regular rhythm.  Tachycardia present.   Pulmonary/Chest: Breath sounds normal. Tachypnea noted.  Abdominal: Bowel sounds are normal. There is no tenderness. There is no rigidity and no guarding.  Musculoskeletal:       Right knee: He exhibits normal alignment, no LCL laxity, no bony tenderness, normal meniscus and no MCL laxity.       Left knee: He exhibits normal alignment, no LCL laxity, no bony tenderness, normal meniscus and no MCL laxity.  Neurological: He is alert and oriented to person, place, and time. He has normal strength.  Skin: Skin is warm and dry.  Psychiatric: He has a normal mood and affect.  Frequent hiccups   ED Course  Procedures (including critical care time) Labs Review Labs Reviewed  CBC WITH DIFFERENTIAL - Abnormal; Notable for the following:    RBC  3.29 (*)    Hemoglobin 9.8 (*)    HCT 30.6 (*)    Neutrophils Relative % 79 (*)    Lymphocytes Relative 11 (*)    All other components within normal limits  COMPREHENSIVE METABOLIC PANEL - Abnormal; Notable for the following:    Chloride 90 (*)    Glucose, Bld 101 (*)    BUN 24 (*)    Creatinine, Ser 8.17 (*)    Total Protein 8.6 (*)    Albumin 3.2 (*)    GFR calc non Af Amer 7 (*)    GFR calc Af Amer 8 (*)    All other components within normal limits  CULTURE, BLOOD (ROUTINE X 2)  CULTURE, BLOOD (ROUTINE X 2)  I-STAT TROPOININ, ED  I-STAT CG4 LACTIC ACID, ED    Imaging Review Dg Chest 2 View  07/08/2013   CLINICAL DATA:  Fever and chest pain  EXAM: CHEST  2 VIEW  COMPARISON:  07/04/2013  FINDINGS: Cardiac shadow is stable. The mild vascular congestion seen on the prior exam is stable. A dialysis catheter is again seen. No focal infiltrate or sizable effusion is noted. No bony abnormality is seen.  IMPRESSION: No change from the prior exam. Stable mild vascular congestion is seen.   Electronically Signed   By: Inez Catalina M.D.   On: 07/08/2013  13:24     EKG Interpretation   Date/Time:  Friday July 08 2013 12:54:23 EDT Ventricular Rate:  113 PR Interval:  142 QRS Duration: 84 QT Interval:  296 QTC Calculation: 406 R Axis:   -33 Text Interpretation:  Sinus tachycardia Possible Left atrial enlargement  Left axis deviation Nonspecific T wave abnormality Compared to previous  tracing Rate faster Confirmed by [facility]  MD, Jenny Reichmann ([id number]) on 07/08/2013  3:17:44 PM      MDM   Final diagnoses:  Hypoxia  Tachycardia     48 yo M with hx of ESRD on dialysis, chronic cirrhosis of unknown etiology presenting with chest pain, SOB, fatigue/weakness, found to be tachycardic and hypoxic. CXR with no obvious source of hypoxia (no pneumonia or signs of fluid overload). EKG not suggestive of cardiac ischemic and troponin negative. Given tachycardia and hypoxia, with moderate risk wells score will need to rule out PE with CT angio of chest.   Regarding leg pain, pt has no specific bony tenderness and is able to flex knees 90 degrees, able to walk, so no indication for imaging of knees at this time per [place] knee rules.  Patient had right EJ line placed for IV access. We're told that he is unable to get a CT angiogram of his chest with this form of IV access. As he is hypoxic, he will require admission to the hospital for further evaluation. Anticipate he will need either better IV access and a CT angiogram of the chest, or a VQ scan performed tomorrow. Will consult hospitalist for admission.  Chrisandra Netters, MD Family Medicine PGY-2   Leeanne Rio, MD 07/08/13 [phone]

## 2013-07-08 NOTE — ED Notes (Signed)
Pts rectal temp. Taken noted to be 99.8.  Fenton Malling RN assisted with turning pt.  Complete set of vitals taken.  Pt alert and watching tv. Complaning of hiccups and right shoulder pain.

## 2013-07-08 NOTE — ED Notes (Signed)
Patient went to xray 

## 2013-07-08 NOTE — ED Notes (Signed)
IV team states unable to get IV with [place].

## 2013-07-08 NOTE — ED Notes (Addendum)
PT reports chills and right shoulder pain for several days. Fell onto [employer]. Pt is shaking in triage; has hiccups; states he was seen here Monday for them and given px but pt was unable to get it filled. Chest pain now being reported. Pt just left dialysis.

## 2013-07-08 NOTE — ED Notes (Signed)
Placed pt on 2L nasal cannula

## 2013-07-09 ENCOUNTER — Encounter (HOSPITAL_COMMUNITY): Payer: Self-pay

## 2013-07-09 ENCOUNTER — Inpatient Hospital Stay (HOSPITAL_COMMUNITY): Payer: Medicare Other

## 2013-07-09 DIAGNOSIS — J189 Pneumonia, unspecified organism: Secondary | ICD-10-CM | POA: Diagnosis present

## 2013-07-09 DIAGNOSIS — J9601 Acute respiratory failure with hypoxia: Secondary | ICD-10-CM | POA: Diagnosis present

## 2013-07-09 DIAGNOSIS — R0902 Hypoxemia: Secondary | ICD-10-CM | POA: Insufficient documentation

## 2013-07-09 LAB — I-STAT ARTERIAL BLOOD GAS, ED
Acid-Base Excess: 7 mmol/L — ABNORMAL HIGH (ref 0.0–2.0)
Bicarbonate: 31.5 mEq/L — ABNORMAL HIGH (ref 20.0–24.0)
O2 Saturation: 93 %
PCO2 ART: 41.2 mmHg (ref 35.0–45.0)
PO2 ART: 62 mmHg — AB (ref 80.0–100.0)
Patient temperature: 98.6
TCO2: 33 mmol/L (ref 0–100)
pH, Arterial: 7.491 — ABNORMAL HIGH (ref 7.350–7.450)

## 2013-07-09 LAB — DIC (DISSEMINATED INTRAVASCULAR COAGULATION) PANEL
APTT: 40 s — AB (ref 24–37)
D DIMER QUANT: 5.91 ug{FEU}/mL — AB (ref 0.00–0.48)
FIBRINOGEN: 542 mg/dL — AB (ref 204–475)
INR: 1.19 (ref 0.00–1.49)
PLATELETS: 215 10*3/uL (ref 150–400)
Prothrombin Time: 14.8 seconds (ref 11.6–15.2)

## 2013-07-09 LAB — COMPREHENSIVE METABOLIC PANEL
AST: 9 U/L (ref 0–37)
Albumin: 2.6 g/dL — ABNORMAL LOW (ref 3.5–5.2)
Alkaline Phosphatase: 61 U/L (ref 39–117)
BUN: 29 mg/dL — ABNORMAL HIGH (ref 6–23)
CALCIUM: 9.5 mg/dL (ref 8.4–10.5)
CO2: 28 meq/L (ref 19–32)
CREATININE: 9.83 mg/dL — AB (ref 0.50–1.35)
Chloride: 94 mEq/L — ABNORMAL LOW (ref 96–112)
GFR, EST AFRICAN AMERICAN: 6 mL/min — AB (ref >90)
GFR, EST NON AFRICAN AMERICAN: 5 mL/min — AB (ref >90)
Glucose, Bld: 93 mg/dL (ref 70–99)
Potassium: 4.1 mEq/L (ref 3.7–5.3)
SODIUM: 137 meq/L (ref 137–147)
TOTAL PROTEIN: 7 g/dL (ref 6.0–8.3)
Total Bilirubin: 0.8 mg/dL (ref 0.3–1.2)

## 2013-07-09 LAB — PROTIME-INR
INR: 1.07 (ref 0.00–1.49)
Prothrombin Time: 13.7 seconds (ref 11.6–15.2)

## 2013-07-09 LAB — CBC WITH DIFFERENTIAL/PLATELET
BASOS PCT: 0 % (ref 0–1)
Basophils Absolute: 0 10*3/uL (ref 0.0–0.1)
EOS ABS: 0.4 10*3/uL (ref 0.0–0.7)
Eosinophils Relative: 5 % (ref 0–5)
HEMATOCRIT: 25 % — AB (ref 39.0–52.0)
Hemoglobin: 7.8 g/dL — ABNORMAL LOW (ref 13.0–17.0)
LYMPHS PCT: 13 % (ref 12–46)
Lymphs Abs: 1 10*3/uL (ref 0.7–4.0)
MCH: 29.7 pg (ref 26.0–34.0)
MCHC: 31.2 g/dL (ref 30.0–36.0)
MCV: 95.1 fL (ref 78.0–100.0)
Monocytes Absolute: 0.7 10*3/uL (ref 0.1–1.0)
Monocytes Relative: 9 % (ref 3–12)
NEUTROS ABS: 5.7 10*3/uL (ref 1.7–7.7)
Neutrophils Relative %: 73 % (ref 43–77)
Platelets: 213 10*3/uL (ref 150–400)
RBC: 2.63 MIL/uL — AB (ref 4.22–5.81)
RDW: 14.4 % (ref 11.5–15.5)
WBC: 7.8 10*3/uL (ref 4.0–10.5)

## 2013-07-09 LAB — APTT: aPTT: 40 seconds — ABNORMAL HIGH (ref 24–37)

## 2013-07-09 LAB — HEMOGLOBIN AND HEMATOCRIT, BLOOD
HCT: 22.7 % — ABNORMAL LOW (ref 39.0–52.0)
Hemoglobin: 7.2 g/dL — ABNORMAL LOW (ref 13.0–17.0)

## 2013-07-09 LAB — RETICULOCYTES
RBC.: 2.43 MIL/uL — AB (ref 4.22–5.81)
Retic Count, Absolute: 94.8 10*3/uL (ref 19.0–186.0)
Retic Ct Pct: 3.9 % — ABNORMAL HIGH (ref 0.4–3.1)

## 2013-07-09 LAB — PRO B NATRIURETIC PEPTIDE

## 2013-07-09 LAB — STREP PNEUMONIAE URINARY ANTIGEN: STREP PNEUMO URINARY ANTIGEN: NEGATIVE

## 2013-07-09 LAB — TROPONIN I
Troponin I: <0.3 ng/mL (ref <0.30)
Troponin I: <0.3 ng/mL (ref <0.30)

## 2013-07-09 LAB — INFLUENZA PANEL BY PCR (TYPE A & B)
H1N1 flu by pcr: NOT DETECTED
Influenza A By PCR: NEGATIVE
Influenza B By PCR: NEGATIVE

## 2013-07-09 LAB — MRSA PCR SCREENING: MRSA by PCR: NEGATIVE

## 2013-07-09 LAB — MAGNESIUM: MAGNESIUM: 2 mg/dL (ref 1.5–2.5)

## 2013-07-09 LAB — TSH: TSH: 3.86 u[IU]/mL (ref 0.350–4.500)

## 2013-07-09 LAB — HEPATITIS B SURFACE ANTIGEN: Hepatitis B Surface Ag: NEGATIVE

## 2013-07-09 LAB — DIC (DISSEMINATED INTRAVASCULAR COAGULATION)PANEL

## 2013-07-09 LAB — PHOSPHORUS: Phosphorus: 4.7 mg/dL — ABNORMAL HIGH (ref 2.3–4.6)

## 2013-07-09 MED ORDER — CLONIDINE HCL 0.3 MG/24HR TD PTWK
0.3000 mg | MEDICATED_PATCH | TRANSDERMAL | Status: DC
  Administered 2013-07-09: 0.3 mg via TRANSDERMAL
  Filled 2013-07-09: qty 1

## 2013-07-09 MED ORDER — HYDRALAZINE HCL 50 MG PO TABS
50.0000 mg | ORAL_TABLET | Freq: Three times a day (TID) | ORAL | Status: DC
  Administered 2013-07-09 – 2013-07-14 (×13): 50 mg via ORAL
  Filled 2013-07-09 (×19): qty 1

## 2013-07-09 MED ORDER — HEPARIN SODIUM (PORCINE) 1000 UNIT/ML DIALYSIS
1000.0000 [IU] | INTRAMUSCULAR | Status: DC | PRN
  Administered 2013-07-09: 5000 [IU] via INTRAVENOUS_CENTRAL
  Filled 2013-07-09: qty 1

## 2013-07-09 MED ORDER — AMLODIPINE BESYLATE 10 MG PO TABS
10.0000 mg | ORAL_TABLET | Freq: Every day | ORAL | Status: DC
  Administered 2013-07-09 – 2013-07-13 (×5): 10 mg via ORAL
  Filled 2013-07-09 (×7): qty 1

## 2013-07-09 MED ORDER — HEPARIN SODIUM (PORCINE) 1000 UNIT/ML DIALYSIS
1500.0000 [IU] | INTRAMUSCULAR | Status: DC | PRN
  Administered 2013-07-09: 1500 [IU] via INTRAVENOUS_CENTRAL

## 2013-07-09 MED ORDER — SODIUM CHLORIDE 0.9 % IV SOLN: INTRAVENOUS | Status: DC

## 2013-07-09 MED ORDER — ONDANSETRON HCL 4 MG/2ML IJ SOLN: 4.0000 mg | Freq: Four times a day (QID) | INTRAMUSCULAR | Status: DC | PRN

## 2013-07-09 MED ORDER — DOXERCALCIFEROL 4 MCG/2ML IV SOLN
5.0000 ug | Freq: Once | INTRAVENOUS | Status: AC
  Administered 2013-07-09: 5 ug via INTRAVENOUS
  Filled 2013-07-09: qty 4

## 2013-07-09 MED ORDER — SODIUM CHLORIDE 0.9 % IV SOLN: 100.0000 mL | INTRAVENOUS | Status: DC | PRN

## 2013-07-09 MED ORDER — DOXERCALCIFEROL 4 MCG/2ML IV SOLN
INTRAVENOUS | Status: AC
  Filled 2013-07-09: qty 4

## 2013-07-09 MED ORDER — LIDOCAINE-PRILOCAINE 2.5-2.5 % EX CREA: 1.0000 "application " | TOPICAL_CREAM | CUTANEOUS | Status: DC | PRN

## 2013-07-09 MED ORDER — LABETALOL HCL 5 MG/ML IV SOLN
INTRAVENOUS | Status: AC
  Filled 2013-07-09: qty 4

## 2013-07-09 MED ORDER — CLONIDINE HCL 0.1 MG PO TABS
0.1000 mg | ORAL_TABLET | Freq: Once | ORAL | Status: AC
  Administered 2013-07-09: 0.1 mg via ORAL
  Filled 2013-07-09: qty 1

## 2013-07-09 MED ORDER — GABAPENTIN 100 MG PO CAPS
100.0000 mg | ORAL_CAPSULE | Freq: Three times a day (TID) | ORAL | Status: DC
  Administered 2013-07-09 – 2013-07-14 (×13): 100 mg via ORAL
  Filled 2013-07-09 (×19): qty 1

## 2013-07-09 MED ORDER — NITROGLYCERIN IN D5W 200-5 MCG/ML-% IV SOLN
2.0000 ug/min | INTRAVENOUS | Status: DC
  Administered 2013-07-09 (×2): 130 ug/min via INTRAVENOUS
  Administered 2013-07-10: 100 ug/min via INTRAVENOUS
  Filled 2013-07-09 (×4): qty 250

## 2013-07-09 MED ORDER — ALTEPLASE 2 MG IJ SOLR: 2.0000 mg | Freq: Once | INTRAMUSCULAR | Status: AC | PRN

## 2013-07-09 MED ORDER — SODIUM CHLORIDE 0.9 % IV SOLN: 250.0000 mL | INTRAVENOUS | Status: DC | PRN

## 2013-07-09 MED ORDER — HYDROMORPHONE HCL PF 1 MG/ML IJ SOLN
INTRAMUSCULAR | Status: AC
  Filled 2013-07-09: qty 1

## 2013-07-09 MED ORDER — PENTAFLUOROPROP-TETRAFLUOROETH EX AERO: 1.0000 "application " | INHALATION_SPRAY | CUTANEOUS | Status: DC | PRN

## 2013-07-09 MED ORDER — LABETALOL HCL 5 MG/ML IV SOLN
10.0000 mg | Freq: Once | INTRAVENOUS | Status: AC
  Administered 2013-07-09: 10 mg via INTRAVENOUS
  Filled 2013-07-09: qty 4

## 2013-07-09 MED ORDER — CLONIDINE HCL 0.3 MG/24HR TD PTWK: 0.3000 mg | MEDICATED_PATCH | TRANSDERMAL | Status: DC

## 2013-07-09 MED ORDER — CYCLOBENZAPRINE HCL 10 MG PO TABS: 10.0000 mg | ORAL_TABLET | Freq: Three times a day (TID) | ORAL | Status: DC | PRN

## 2013-07-09 MED ORDER — DARBEPOETIN ALFA-POLYSORBATE 100 MCG/0.5ML IJ SOLN
100.0000 ug | INTRAMUSCULAR | Status: DC
  Administered 2013-07-13: 100 ug via INTRAVENOUS
  Filled 2013-07-09: qty 0.5

## 2013-07-09 MED ORDER — NEPRO/CARBSTEADY PO LIQD
237.0000 mL | ORAL | Status: DC | PRN
  Filled 2013-07-09: qty 237

## 2013-07-09 MED ORDER — HEPARIN SODIUM (PORCINE) 5000 UNIT/ML IJ SOLN
5000.0000 [IU] | Freq: Three times a day (TID) | INTRAMUSCULAR | Status: DC
  Filled 2013-07-09 (×4): qty 1

## 2013-07-09 MED ORDER — SEVELAMER CARBONATE 800 MG PO TABS
2400.0000 mg | ORAL_TABLET | Freq: Three times a day (TID) | ORAL | Status: DC
  Administered 2013-07-09: 2400 mg via ORAL
  Administered 2013-07-10: 4000 mg via ORAL
  Administered 2013-07-10: 2400 mg via ORAL
  Administered 2013-07-11 (×2): 4000 mg via ORAL
  Administered 2013-07-11: 2400 mg via ORAL
  Administered 2013-07-12: 1600 mg via ORAL
  Administered 2013-07-12 – 2013-07-14 (×5): 4000 mg via ORAL
  Filled 2013-07-09 (×19): qty 7

## 2013-07-09 MED ORDER — LABETALOL HCL 5 MG/ML IV SOLN
10.0000 mg | INTRAVENOUS | Status: DC | PRN
  Administered 2013-07-09: 10 mg via INTRAVENOUS
  Filled 2013-07-09: qty 4

## 2013-07-09 MED ORDER — SODIUM CHLORIDE 0.9 % IJ SOLN
3.0000 mL | Freq: Two times a day (BID) | INTRAMUSCULAR | Status: DC
  Administered 2013-07-11 (×2): 3 mL via INTRAVENOUS

## 2013-07-09 MED ORDER — HYDROCODONE-ACETAMINOPHEN 5-325 MG PO TABS
1.0000 | ORAL_TABLET | Freq: Three times a day (TID) | ORAL | Status: DC | PRN
  Administered 2013-07-09 – 2013-07-13 (×6): 1 via ORAL
  Filled 2013-07-09 (×6): qty 1

## 2013-07-09 MED ORDER — LABETALOL HCL 5 MG/ML IV SOLN: 10.0000 mg | INTRAVENOUS | Status: DC | PRN

## 2013-07-09 MED ORDER — SODIUM CHLORIDE 0.9 % IV SOLN
62.5000 mg | INTRAVENOUS | Status: DC
  Filled 2013-07-09 (×3): qty 5

## 2013-07-09 MED ORDER — ISOSORBIDE MONONITRATE ER 30 MG PO TB24
30.0000 mg | ORAL_TABLET | Freq: Every day | ORAL | Status: DC
  Administered 2013-07-09 – 2013-07-14 (×6): 30 mg via ORAL
  Filled 2013-07-09 (×6): qty 1

## 2013-07-09 MED ORDER — NITROGLYCERIN IN D5W 200-5 MCG/ML-% IV SOLN
2.0000 ug/min | INTRAVENOUS | Status: DC
  Administered 2013-07-09: 5 ug/min via INTRAVENOUS
  Filled 2013-07-09: qty 250

## 2013-07-09 MED ORDER — MORPHINE SULFATE 2 MG/ML IJ SOLN: 2.0000 mg | INTRAMUSCULAR | Status: DC | PRN

## 2013-07-09 MED ORDER — PIPERACILLIN-TAZOBACTAM IN DEX 2-0.25 GM/50ML IV SOLN
2.2500 g | Freq: Three times a day (TID) | INTRAVENOUS | Status: DC
Start: 2013-07-09 — End: 2013-07-12
  Administered 2013-07-09 – 2013-07-11 (×8): 2.25 g via INTRAVENOUS
  Filled 2013-07-09 (×16): qty 50

## 2013-07-09 MED ORDER — ATORVASTATIN CALCIUM 20 MG PO TABS
20.0000 mg | ORAL_TABLET | Freq: Every day | ORAL | Status: DC
  Administered 2013-07-09 – 2013-07-14 (×6): 20 mg via ORAL
  Filled 2013-07-09 (×6): qty 1

## 2013-07-09 MED ORDER — METOPROLOL SUCCINATE ER 25 MG PO TB24
25.0000 mg | ORAL_TABLET | Freq: Every day | ORAL | Status: DC
  Administered 2013-07-09 – 2013-07-14 (×6): 25 mg via ORAL
  Filled 2013-07-09 (×6): qty 1

## 2013-07-09 MED ORDER — SODIUM CHLORIDE 0.9 % IJ SOLN: 3.0000 mL | INTRAMUSCULAR | Status: DC | PRN

## 2013-07-09 MED ORDER — LIDOCAINE HCL (PF) 1 % IJ SOLN: 5.0000 mL | INTRAMUSCULAR | Status: DC | PRN

## 2013-07-09 MED ORDER — SODIUM CHLORIDE 0.9 % IV SOLN
INTRAVENOUS | Status: DC
  Administered 2013-07-09: 10 mL via INTRAVENOUS

## 2013-07-09 MED ORDER — ONDANSETRON 8 MG/NS 50 ML IVPB
8.0000 mg | Freq: Four times a day (QID) | INTRAVENOUS | Status: DC | PRN
  Filled 2013-07-09: qty 8

## 2013-07-09 MED ORDER — HYDRALAZINE HCL 20 MG/ML IJ SOLN
10.0000 mg | INTRAMUSCULAR | Status: DC | PRN
Start: 2013-07-09 — End: 2013-07-09
  Administered 2013-07-09: 10 mg via INTRAVENOUS
  Filled 2013-07-09: qty 1

## 2013-07-09 MED ORDER — LISINOPRIL 40 MG PO TABS
40.0000 mg | ORAL_TABLET | Freq: Every day | ORAL | Status: DC
  Filled 2013-07-09: qty 1

## 2013-07-09 MED ORDER — ONDANSETRON HCL 4 MG/2ML IJ SOLN
INTRAMUSCULAR | Status: AC
  Administered 2013-07-09: 4 mg
  Filled 2013-07-09: qty 2

## 2013-07-09 MED ORDER — VANCOMYCIN HCL 10 G IV SOLR
1500.0000 mg | Freq: Once | INTRAVENOUS | Status: AC
  Administered 2013-07-09: 1500 mg via INTRAVENOUS
  Filled 2013-07-09: qty 1500

## 2013-07-09 MED ORDER — ALPRAZOLAM 0.5 MG PO TABS
1.0000 mg | ORAL_TABLET | Freq: Every evening | ORAL | Status: DC | PRN
  Administered 2013-07-11 – 2013-07-13 (×2): 1 mg via ORAL
  Filled 2013-07-09 (×3): qty 2

## 2013-07-09 MED ORDER — HYDROMORPHONE HCL PF 1 MG/ML IJ SOLN
0.5000 mg | INTRAMUSCULAR | Status: DC | PRN
  Administered 2013-07-09 – 2013-07-13 (×3): 0.5 mg via INTRAVENOUS
  Filled 2013-07-09 (×2): qty 1

## 2013-07-09 MED ORDER — SEVELAMER CARBONATE 800 MG PO TABS
2400.0000 mg | ORAL_TABLET | Freq: Three times a day (TID) | ORAL | Status: DC
  Filled 2013-07-09: qty 7

## 2013-07-09 MED ORDER — DOXERCALCIFEROL 4 MCG/2ML IV SOLN
5.0000 ug | INTRAVENOUS | Status: DC
  Administered 2013-07-11 – 2013-07-13 (×2): 5 ug via INTRAVENOUS
  Filled 2013-07-09 (×2): qty 4

## 2013-07-09 MED ORDER — LISINOPRIL 40 MG PO TABS
40.0000 mg | ORAL_TABLET | Freq: Every day | ORAL | Status: DC
  Administered 2013-07-09 – 2013-07-14 (×6): 40 mg via ORAL
  Filled 2013-07-09 (×6): qty 1

## 2013-07-09 MED ORDER — VANCOMYCIN HCL IN DEXTROSE 750-5 MG/150ML-% IV SOLN
750.0000 mg | INTRAVENOUS | Status: DC
  Administered 2013-07-11: 750 mg via INTRAVENOUS
  Filled 2013-07-09 (×3): qty 150

## 2013-07-09 MED ORDER — ASPIRIN 81 MG PO CHEW
81.0000 mg | CHEWABLE_TABLET | ORAL | Status: AC
  Administered 2013-07-10: 81 mg via ORAL
  Filled 2013-07-09: qty 1

## 2013-07-09 NOTE — Progress Notes (Signed)
ANTIBIOTIC CONSULT NOTE - INITIAL  Pharmacy Consult for vancomycin and zosyn Indication: HCAP  Allergies  Allergen Reactions  . Aspirin     Tends to make him "feel worse" when he tries to take it for a cold or something    Patient Measurements: Height: 5' 6"  (167.6 cm) Weight: 159 lb 2.8 oz (72.2 kg) IBW/kg (Calculated) : 63.8 Usual dry weight: 75.7 kg  Vital Signs: Temp: 99.3 F (37.4 C) (06/05 2300) Temp src: Rectal (06/05 2300) BP: 233/113 mmHg (06/06 0230) Pulse Rate: 107 (06/06 0230) Intake/Output from previous day: 06/05 0701 - 06/06 0700 In: 514.6 [I.V.:514.6] Out: -  Intake/Output from this shift: Total I/O In: 514.6 [I.V.:514.6] Out: -   Labs:  Recent Labs  07/08/13 1336  WBC 9.5  HGB 9.8*  PLT 276  CREATININE 8.17*   Estimated Creatinine Clearance: 9.9 ml/min (by C-G formula based on Cr of 8.17). No results found for this basename: VANCOTROUGH, VANCOPEAK, VANCORANDOM, GENTTROUGH, GENTPEAK, GENTRANDOM, TOBRATROUGH, TOBRAPEAK, TOBRARND, AMIKACINPEAK, AMIKACINTROU, AMIKACIN,  in the last 72 hours   Microbiology: No results found for this or any previous visit (from the past 720 hour(s)).  Medical History: Past Medical History  Diagnosis Date  . Chronic kidney disease   . Hypertension   . COPD (chronic obstructive pulmonary disease)   . Arthritis     GOUT  . Anemia   . Secondary hyperparathyroidism   . Anxiety   . Depression   . Shortness of breath     With exertion  . GERD (gastroesophageal reflux disease)   . Sepsis 02/2013    from AVF , treated with Vancomycin.    Medications:  Prescriptions prior to admission  Medication Sig Dispense Refill  . ALPRAZolam (XANAX) 1 MG tablet Take 1 mg by mouth at bedtime as needed for anxiety.      Marland Kitchen amLODipine (NORVASC) 10 MG tablet Take 10 mg by mouth at bedtime.       Marland Kitchen atorvastatin (LIPITOR) 20 MG tablet Take 1 tablet (20 mg total) by mouth daily.  90 tablet  3  . cloNIDine (CATAPRES - DOSED IN  MG/24 HR) 0.3 mg/24hr Place 1 patch onto the skin once a week. Saturday.      . cyclobenzaprine (FLEXERIL) 10 MG tablet Take 1 tablet (10 mg total) by mouth 2 (two) times daily as needed for muscle spasms.  10 tablet  0  . esomeprazole (NEXIUM) 40 MG capsule Take 40-80 mg by mouth daily as needed (reflux/indigestion. Dose varies depending on severity.).       Marland Kitchen gabapentin (NEURONTIN) 100 MG capsule Take 100 mg by mouth 3 (three) times daily.       Marland Kitchen HYDROcodone-acetaminophen (NORCO/VICODIN) 5-325 MG per tablet Take 1 tablet by mouth 3 (three) times daily as needed for moderate pain or severe pain.  6 tablet  0  . isosorbide mononitrate (IMDUR) 30 MG 24 hr tablet Take 1 tablet (30 mg total) by mouth daily.  90 tablet  3  . lisinopril (PRINIVIL,ZESTRIL) 40 MG tablet Take 40 mg by mouth at bedtime.       . metoprolol succinate (TOPROL-XL) 25 MG 24 hr tablet Take 1 tablet (25 mg total) by mouth daily.  30 tablet  6  . sevelamer carbonate (RENVELA) 800 MG tablet Take 2,400-5,600 mg by mouth 3 (three) times daily with meals. Takes 3-4 tablets if the meal is light and 6-7 tablets if he eats a big meal.       Assessment: 48 yo  M with chills and R shoulder pain for several days and chest pain.  Pharmacy consulted to dose vancomycin and zosyn for HCAP.   ESRD, s/p HD 6/5.  On MWF HD.  Usual dry weight is ~ 75.7 kg.  WBC 9.5. Temp 100.1; CXR neg for PNA.   6/5 BC x2>>  6/6 vanc>> 6/6 zosyn>>  Goal of Therapy: pre-HD vanc level 15-25 mcg/ml  Plan:  -zosyn 2.25 gm IV q8h -vancomycin 1500 mg IV loading dose, then vancomycin 750 mg IV after each hemodialysis on MWF -f/u wbc, temp, culture data, clinical course -will get pre-HD vanc level prn  Eudelia Bunch, Pharm.D. [phone] 07/09/2013 3:03 AM

## 2013-07-09 NOTE — ED Notes (Signed)
Spoke with EDP about pt's BP. MD aware. No new orders at this time.

## 2013-07-09 NOTE — Progress Notes (Signed)
Ran 3hr of a 4hr hemodialysis Tx then signed off AMA. Discussed with pt importance of getting entire Tx but became very angry and started yelling at me. States this is a right he has.

## 2013-07-09 NOTE — Consult Note (Addendum)
Renal Service Consult Note [facility]  [facility] 07/09/2013 [name] Requesting Physician:  Dr Broadus John  Reason for Consult:  ESRD pt with chills, CP and SOB HPI: The patient is a 48 y.o. year-old w ESRD and longstanding HTN presenting with SOB, CP and chills to ED yesterday.  Pt had recent +cardiac stress of intermediate risk but refused heart cath unless it could be done under [place].  He had HD yesterday but missed Wed session this week.  CXR showed poor inspiration, vasc congestion, loss of L hemidiaphragm.  Admitted to SDU and started in IV abx, renal consulted for hypoxemia and rule out vol excess.  BC's obtained. IV NTG started for uncontrolled HTN.   Patient routinely signs of early from HD, his average treatement is 3 hrs or less. He suffers from a lot of anxiety.  He takes xanax tid at home prn.   An abd [place] in Dec 14 showed 16cm liver w heterogenous echotexture, slightly nodular contour compatible with "reported hx of cirrhosis".  An abd CT scan done in March 15 showed empheysmatous lung changes, moderate amount of ascites with no mention of cirrhosis and enlarge chambers of the heart.   ECHO in Nov 14 showed normal LVEF 60% and no other sig findings.  Chart review: May 04-- HTN crisis, CKD creat 1.9, medication noncompliance, abd pain Jul 05-- CHF, uncontroleed HTN w med noncompliance, CKD, tobacco Mar 06-- viral syndrome fever, CKD 3, HTN Jan 09-- placement of [facility] and L RC AVF Feb 09-- new ESRD started on HD, vol excess, pulm edema, HTN, COPD, tobacco and obesity, gout  Mar 09  L BC AVF Nov 14- MRSA bacteremia, revision of aneurysmal AVF LUA, placement of TDC. AVF not grossly infected at time of revision. TDC placed while waiting for AVF to heal. Pt refuse TEE, rx'd with 6 wks empiric IV abx Jan 15-- placement of R BC AVF Dr [name]    ROS  prod cough green sputum  lots of anxiety  no abd pain  +N/V no diarrhea  no jt pain  no skin rash  Past  Medical History  Past Medical History  Diagnosis Date  . ESRD on hemodialysis     Started HD Jan 2009.  ESRD was due to HTN.  Dx'd with HTN in hospital 1996 according to pt, they had to keep him so he could get Medicaid to afford the BP medications.  First saw a nephrologist and started HD in the same year 2009.  Gets HD at [facility] on a MWF schedule.  Does not have DM. He had a left RC AVF that never functioned, a left upper arm AVF that worked for about 5 years and as of June  . Hypertension   . COPD (chronic obstructive pulmonary disease)   . Arthritis     GOUT  . Anemia   . Secondary hyperparathyroidism   . Anxiety   . Depression   . Shortness of breath     With exertion  . GERD (gastroesophageal reflux disease)   . Sepsis 02/2013    from AVF , treated with Vancomycin.   Past Surgical History  Past Surgical History  Procedure Laterality Date  . Av fistula placement  2009    Left lower arm AVF  . Ligation of arteriovenous  fistula Left 12/22/2012    Procedure: LIGATION OF ARTERIOVENOUS  FISTULA;EXCISION OF LARGE ANEURYSMS;;  Surgeon: Elam Dutch, MD;  Location: [place];  Service: Vascular;  Laterality: Left;  . Insertion of dialysis catheter N/A 12/23/2012    Procedure: INSERTION OF DIALYSIS CATHETER; ULTRASOUND GUIDED;  Surgeon: Angelia Mould, MD;  Location: [place];  Service: Vascular;  Laterality: N/A;  . Av fistula placement Right 02/22/2013    Procedure:  CREATION  OF BRACHIAL CEPHALIC FISTULA RIGHT ARM;  Surgeon: Elam Dutch, MD;  Location: [place];  Service: Vascular;  Laterality: Right;  . Esophagogastroduodenoscopy (egd) with propofol N/A 04/12/2013    Procedure: ESOPHAGOGASTRODUODENOSCOPY (EGD) WITH PROPOFOL;  Surgeon: Arta Silence, MD;  [facility];  Service: Endoscopy;  Laterality: N/A;   Family History  Family History  Problem Relation Age of Onset  . Stroke Father    Social History  reports that he has been smoking Cigarettes.  He has a 8  pack-year smoking history. He has never used smokeless tobacco. He reports that he drinks alcohol. He reports that he does not use illicit drugs. Allergies  Allergies  Allergen Reactions  . Aspirin     Tends to make him "feel worse" when he tries to take it for a cold or something   Home medications Prior to Admission medications   Medication Sig Start Date End Date Taking? Authorizing Provider  ALPRAZolam Duanne Moron) 1 MG tablet Take 1 mg by mouth at bedtime as needed for anxiety.   Yes Historical Provider, MD  amLODipine (NORVASC) 10 MG tablet Take 10 mg by mouth at bedtime.    Yes Historical Provider, MD  atorvastatin (LIPITOR) 20 MG tablet Take 1 tablet (20 mg total) by mouth daily. 06/21/13  Yes Dorothy Spark, MD  cloNIDine (CATAPRES - DOSED IN MG/24 HR) 0.3 mg/24hr Place 1 patch onto the skin once a week. Saturday.   Yes Historical Provider, MD  cyclobenzaprine (FLEXERIL) 10 MG tablet Take 1 tablet (10 mg total) by mouth 2 (two) times daily as needed for muscle spasms. 07/04/13  Yes Domenic Moras, PA-C  esomeprazole (NEXIUM) 40 MG capsule Take 40-80 mg by mouth daily as needed (reflux/indigestion. Dose varies depending on severity.).    Yes Historical Provider, MD  gabapentin (NEURONTIN) 100 MG capsule Take 100 mg by mouth 3 (three) times daily.  05/05/13  Yes Historical Provider, MD  HYDROcodone-acetaminophen (NORCO/VICODIN) 5-325 MG per tablet Take 1 tablet by mouth 3 (three) times daily as needed for moderate pain or severe pain. 07/04/13  Yes Domenic Moras, PA-C  isosorbide mononitrate (IMDUR) 30 MG 24 hr tablet Take 1 tablet (30 mg total) by mouth daily. 06/21/13  Yes Dorothy Spark, MD  lisinopril (PRINIVIL,ZESTRIL) 40 MG tablet Take 40 mg by mouth at bedtime.    Yes Historical Provider, MD  metoprolol succinate (TOPROL-XL) 25 MG 24 hr tablet Take 1 tablet (25 mg total) by mouth daily. 05/31/13  Yes Dorothy Spark, MD  sevelamer carbonate (RENVELA) 800 MG tablet Take 2,400-5,600 mg by mouth 3  (three) times daily with meals. Takes 3-4 tablets if the meal is light and 6-7 tablets if he eats a big meal.   Yes    Liver Function Tests  Recent Labs Lab 07/08/13 1336 07/09/13 0300  AST 13 9  ALT 5 <5  ALKPHOS 83 61  BILITOT 1.2 0.8  PROT 8.6* 7.0  ALBUMIN 3.2* 2.6*   No results found for this basename: LIPASE, AMYLASE,  in the last 168 hours CBC  Recent Labs Lab 07/04/13 1134  07/08/13 1336 07/09/13 0300 07/09/13 0655  WBC 10.8*  --  9.5 7.8  --  NEUTROABS 8.6*  --  7.5 5.7  --   HGB 10.3*  < > 9.8* 7.8* 7.2*  HCT 31.5*  < > 30.6* 25.0* 22.7*  MCV 93.2  --  93.0 95.1  --   PLT 265  --  276 213 215  < > = values in this interval not displayed. Basic Metabolic Panel  Recent Labs Lab 07/04/13 1145 07/08/13 1336 07/09/13 0300  NA 140 137 137  K 3.5* 4.2 4.1  CL 92* 90* 94*  CO2  --  27 28  GLUCOSE 99 101* 93  BUN 20 24* 29*  CREATININE 7.80* 8.17* 9.83*  CALCIUM  --  9.8 9.5  PHOS  --   --  4.7*    Filed Vitals:   07/09/13 0530 07/09/13 0545 07/09/13 0600 07/09/13 0800  BP: 176/79 171/89 185/78 176/73  Pulse: 80 81 83   Temp:    97.9 F (36.6 C)  TempSrc:    Oral  Resp: 29 28 29 30   Height:      Weight:      SpO2: 96% 95% 95% 96%   Exam: Alert, no distress, looks uncomfortable, withdrawn and tired, fully responsive and oriented No rash, cyanosis or gangrene Sclera anicteric, throat clear +JVD Chest bilat coarse insp rales 1/3 post RRR no MRG Abd soft, NTND, mild obesity Ext no LE or UE edema RUA AVF is patent L IJ cath is clean exit site   HD: [facility] [place] 4h   73.5kg  RUA AVF    450/A1.5   Heparin 1800 Aranesp 100 ug q Wed   Venofer 50/wed  Hect 5 mcg pth 115, TSat 33%       Assessment: 1 SOB/ hypoxemia- with fever and missed HD, prob vol excess and pulm edema, may also have PNA 2 ESRD on HD using TDC, AVF tried this week w/o success 3 HTN long hx poor control 4 Anxiety/ depression 5 Anemia on aranesp 6 HPTH cont vit D, binders 7  HTN/vol - is below dry wt but BP up and may be wet on CXR, plan max UF w HD today. Cont po BP meds, wean off IV ntg during HD today 8 Hx cirrhosis / ascites- unclear cause? 9 COPD   Plan- HD this am, will follow   Kelly Splinter MD (pgr) [phone]    ([phone] 07/09/2013, 9:53 AM

## 2013-07-09 NOTE — ED Notes (Signed)
Attempted to give report 

## 2013-07-09 NOTE — Progress Notes (Signed)
Md notified pt's sbp still greater than 180,  After multiple times titrated NtG gtt and prn meds. New orders received will continue to monitor. Wynona Canes

## 2013-07-09 NOTE — H&P (Signed)
[facility] History and Physical  Patient: James Wall  YKD:[id number]  DOB: 1965/04/21  DOS: the patient was seen and examined on 07/09/2013 PCP: [name], MD  Chief Complaint: Chest pain  HPI: James Wall is a 48 y.o. male with Past medical history of chronic kidney disease, hypertension, COPD, GERD, abnormal stress test patient refused PCI, ESRD on hemodialysis, anemia. Patient presented with complaints of chest pain and fever. Patient mentions he has been having shortness of breath and chest pain on and off since last 2 weeks. The chest pain is located on the right side and he is a sharp pain which gets worse when he takes a deep breath and when he has a dry cough. He mentions that he has been having some generalized weakness and has fallen multiple times. His last fall was on Sunday when he fell on his knee and had severe pain in his knee. He also has some pain in his neck and head since last few weeks. The pain in the neck is more on the right side along his shoulder. He has some take off which has been on and off since last few months. He complains of some acid reflux. He denies any lower extremity swelling. He mentions he otherwise is compliant with his hemodialysis but that missed his hemodialysis on Wednesday. He has mental hemodialysis prior to his arrival to the ED. He was so weak that he was brought in the beachchair from hemodialysis. He had some fever in the  Hemodialysis. He denies any orthopnea or PND. He denies any chest time of my evaluation.  The patient is coming from home. And at his baseline independent for most of his ADL.  Review of Systems: as mentioned in the history of present illness.  A Comprehensive review of the other systems is negative.  Past Medical History  Diagnosis Date  . Chronic kidney disease   . Hypertension   . COPD (chronic obstructive pulmonary disease)   . Arthritis     GOUT  . Anemia   . Secondary  hyperparathyroidism   . Anxiety   . Depression   . Shortness of breath     With exertion  . GERD (gastroesophageal reflux disease)   . Sepsis 02/2013    from AVF , treated with Vancomycin.   Past Surgical History  Procedure Laterality Date  . Av fistula placement  2009    Left lower arm AVF  . Ligation of arteriovenous  fistula Left 12/22/2012    Procedure: LIGATION OF ARTERIOVENOUS  FISTULA;EXCISION OF LARGE ANEURYSMS;;  Surgeon: Elam Dutch, MD;  Location: [facility] [place];  Service: Vascular;  Laterality: Left;  . Insertion of dialysis catheter N/A 12/23/2012    Procedure: INSERTION OF DIALYSIS CATHETER; ULTRASOUND GUIDED;  Surgeon: Angelia Mould, MD;  Location: [facility];  Service: Vascular;  Laterality: N/A;  . Av fistula placement Right 02/22/2013    Procedure:  CREATION  OF BRACHIAL CEPHALIC FISTULA RIGHT ARM;  Surgeon: Elam Dutch, MD;  Location: [place];  Service: Vascular;  Laterality: Right;  . Esophagogastroduodenoscopy (egd) with propofol N/A 04/12/2013    Procedure: ESOPHAGOGASTRODUODENOSCOPY (EGD) WITH PROPOFOL;  Surgeon: Arta Silence, MD;  Location: [facility];  Service: Endoscopy;  Laterality: N/A;   Social History:  reports that he has been smoking Cigarettes.  He has a 8 pack-year smoking history. He has never used smokeless tobacco. He reports that he drinks alcohol. He reports that he does not use illicit drugs.  Allergies  Allergen Reactions  . Aspirin     Tends to make him "feel worse" when he tries to take it for a cold or something    Family History  Problem Relation Age of Onset  . Stroke Father     Prior to Admission medications   Medication Sig Start Date End Date Taking? Authorizing Provider  ALPRAZolam Duanne Moron) 1 MG tablet Take 1 mg by mouth at bedtime as needed for anxiety.   Yes Historical Provider, MD  amLODipine (NORVASC) 10 MG tablet Take 10 mg by mouth at bedtime.    Yes Historical Provider, MD  atorvastatin (LIPITOR) 20 MG tablet Take 1  tablet (20 mg total) by mouth daily. 06/21/13  Yes Dorothy Spark, MD  cloNIDine (CATAPRES - DOSED IN MG/24 HR) 0.3 mg/24hr Place 1 patch onto the skin once a week. Saturday.   Yes Historical Provider, MD  cyclobenzaprine (FLEXERIL) 10 MG tablet Take 1 tablet (10 mg total) by mouth 2 (two) times daily as needed for muscle spasms. 07/04/13  Yes Domenic Moras, PA-C  esomeprazole (NEXIUM) 40 MG capsule Take 40-80 mg by mouth daily as needed (reflux/indigestion. Dose varies depending on severity.).    Yes Historical Provider, MD  gabapentin (NEURONTIN) 100 MG capsule Take 100 mg by mouth 3 (three) times daily.  05/05/13  Yes Historical Provider, MD  HYDROcodone-acetaminophen (NORCO/VICODIN) 5-325 MG per tablet Take 1 tablet by mouth 3 (three) times daily as needed for moderate pain or severe pain. 07/04/13  Yes Domenic Moras, PA-C  isosorbide mononitrate (IMDUR) 30 MG 24 hr tablet Take 1 tablet (30 mg total) by mouth daily. 06/21/13  Yes Dorothy Spark, MD  lisinopril (PRINIVIL,ZESTRIL) 40 MG tablet Take 40 mg by mouth at bedtime.    Yes Historical Provider, MD  metoprolol succinate (TOPROL-XL) 25 MG 24 hr tablet Take 1 tablet (25 mg total) by mouth daily. 05/31/13  Yes Dorothy Spark, MD  sevelamer carbonate (RENVELA) 800 MG tablet Take 2,400-5,600 mg by mouth 3 (three) times daily with meals. Takes 3-4 tablets if the meal is light and 6-7 tablets if he eats a big meal.   Yes     Physical Exam: Filed Vitals:   07/09/13 0245 07/09/13 0300 07/09/13 0315 07/09/13 0330  BP: 199/115 187/106 220/97 206/92  Pulse: 87 90 94 97  Temp:      TempSrc:      Resp: 16 25 32 26  Height:      Weight:      SpO2: 94% 99% 99% 98%    General: Alert, Awake and Oriented to Time, Place and Person. Appear in mild distress Eyes: PERRL ENT: Oral Mucosa clear moist. Neck: Difficult to assess JVD Cardiovascular: S1 and S2 Present, aortic systolic Murmur, Peripheral Pulses Present Respiratory: Bilateral Air entry equal and  Decreased, left-sided Crackles, no wheezes Abdomen: Bowel Sound Present, Soft and Non tender Skin: No Rash Extremities:  no Pedal edema, no calf tenderness Neurologic: Grossly no focal neuro deficit. Labs on Admission:  CBC:  Recent Labs Lab 07/04/13 1134 07/04/13 1145 07/08/13 1336  WBC 10.8*  --  9.5  NEUTROABS 8.6*  --  7.5  HGB 10.3* 13.6 9.8*  HCT 31.5* 40.0 30.6*  MCV 93.2  --  93.0  PLT 265  --  276    CMP     Component Value Date/Time   NA 137 07/08/2013 1336   K 4.2 07/08/2013 1336   CL 90* 07/08/2013 1336   CO2 27 07/08/2013 1336  GLUCOSE 101* 07/08/2013 1336   BUN 24* 07/08/2013 1336   CREATININE 8.17* 07/08/2013 1336   CALCIUM 9.8 07/08/2013 1336   CALCIUM 7.7* 02/18/2007 1522   PROT 8.6* 07/08/2013 1336   ALBUMIN 3.2* 07/08/2013 1336   AST 13 07/08/2013 1336   ALT 5 07/08/2013 1336   ALKPHOS 83 07/08/2013 1336   BILITOT 1.2 07/08/2013 1336   GFRNONAA 7* 07/08/2013 1336   GFRAA 8* 07/08/2013 1336    No results found for this basename: LIPASE, AMYLASE,  in the last 168 hours No results found for this basename: AMMONIA,  in the last 168 hours  No results found for this basename: CKTOTAL, CKMB, CKMBINDEX, TROPONINI,  in the last 168 hours BNP (last 3 results) No results found for this basename: PROBNP,  in the last 8760 hours  Radiological Exams on Admission: Dg Chest 2 View  07/08/2013   CLINICAL DATA:  Fever and chest pain  EXAM: CHEST  2 VIEW  COMPARISON:  07/04/2013  FINDINGS: Cardiac shadow is stable. The mild vascular congestion seen on the prior exam is stable. A dialysis catheter is again seen. No focal infiltrate or sizable effusion is noted. No bony abnormality is seen.  IMPRESSION: No change from the prior exam. Stable mild vascular congestion is seen.   Electronically Signed   By: Inez Catalina M.D.   On: 07/08/2013 13:24    EKG: Independently reviewed. normal sinus rhythm, nonspecific ST and T waves changes.  Assessment/Plan Principal Problem:   Acute respiratory  failure with hypoxia Active Problems:   End stage renal disease   GERD (gastroesophageal reflux disease)   Anemia of renal disease   Abnormal stress test   Chest pain at rest   HCAP (healthcare-associated pneumonia)   1. Acute respiratory failure with hypoxia Patient is presenting with complaints of chest pain. He was found to be having hypoxic in the ED requiring 2 L of oxygen. At present he is requiring 4 L of oxygen. ABG shows only hypoxia no hypercarbia. Chest x-ray shows no evidence of pneumonia. He has cough and left-sided crackles and fever. With this it is highly likely that he may have a bronchiolitis versus early pneumonia. Blood cultures already obtained. Sputum cultures urine antigens will also be obtained. Patient will be started on broad-spectrum antibiotics with IV vancomycin and IV Zosyn. There is also a possibility of pulmonary vascular congestion and this patient on hemodialysis. We will consult nephrology for further input. BiPAP as needed respiratory distress. Repeat chest x-ray in the morning. If negative patient may need a CT PE.  2. Abnormal stress test. Accelerated hypertension Chest pain at rest. Patient complains of chest pain on the right side. EKG does not show any evidence of ischemia initial troponin and repeat troponins are negative. At present we would control his heart rate IV when necessary labetalol. We will resume his clonidine patch which she has noticed since last one day. We'll also give him one dose of clonidine. Nitroglycerin drip to control his blood pressure. Also resume his antihypertensive medications at home. Pain control will also help control his blood pressure.  3. Anemia. Patient's hemoglobin has been turned down. At present avoiding pharmacological prophylaxis and monitoring patient's H&H. No active bleeding externally.  4.hickups  Likely due to his ESRD. At present is overall better Continue Flexeril.  DVT Prophylaxis:  Mechanical compression device Nutrition: N.p.o. is a medication  Code Status: Full  Disposition: Admitted to inpatient in step-down unit.  Author: Berle Mull, MD [facility]  Pager: [phone] 07/09/2013, 3:43 AM    If 7PM-7AM, please contact night-coverage [email or web address] Password [email or web address]

## 2013-07-09 NOTE — Procedures (Signed)
I was present at this dialysis session, have reviewed the session itself and made  appropriate changes  Kelly Splinter MD (pgr) [phone]    [phone] 07/09/2013, 1:45 PM

## 2013-07-09 NOTE — ED Notes (Signed)
MD aware of pt's BP.

## 2013-07-09 NOTE — Progress Notes (Signed)
Pt seen and examined, admitted this am per Dr.Patel, please see note for details 48/M with ESRD on HD MWF, non compliance, HTN, H/o abnormal stress test Presented to ER with cough, shortness of breath, low grade fever, chest pain and Hypertensive Emergency -cycle cardiac enzymes -COntinue current Abx -Agree with CTA chest -IV nitroglycerin gtt, add Po hydralazine, continue CLonidine, Lisinopril, norvasc, topril -I think he needs extra volume removal at HD, renal consulted  Domenic Polite, [place]

## 2013-07-10 DIAGNOSIS — N186 End stage renal disease: Secondary | ICD-10-CM

## 2013-07-10 DIAGNOSIS — N039 Chronic nephritic syndrome with unspecified morphologic changes: Secondary | ICD-10-CM

## 2013-07-10 DIAGNOSIS — R079 Chest pain, unspecified: Secondary | ICD-10-CM

## 2013-07-10 DIAGNOSIS — K219 Gastro-esophageal reflux disease without esophagitis: Secondary | ICD-10-CM

## 2013-07-10 DIAGNOSIS — I10 Essential (primary) hypertension: Secondary | ICD-10-CM

## 2013-07-10 DIAGNOSIS — D631 Anemia in chronic kidney disease: Secondary | ICD-10-CM

## 2013-07-10 DIAGNOSIS — R9439 Abnormal result of other cardiovascular function study: Secondary | ICD-10-CM

## 2013-07-10 DIAGNOSIS — I200 Unstable angina: Secondary | ICD-10-CM

## 2013-07-10 DIAGNOSIS — R0902 Hypoxemia: Secondary | ICD-10-CM

## 2013-07-10 LAB — RETICULOCYTES
RBC.: 2.6 MIL/uL — AB (ref 4.22–5.81)
Retic Count, Absolute: 117 10*3/uL (ref 19.0–186.0)
Retic Ct Pct: 4.5 % — ABNORMAL HIGH (ref 0.4–3.1)

## 2013-07-10 LAB — CBC
HEMATOCRIT: 23.7 % — AB (ref 39.0–52.0)
Hemoglobin: 7.4 g/dL — ABNORMAL LOW (ref 13.0–17.0)
MCH: 29.7 pg (ref 26.0–34.0)
MCHC: 31.2 g/dL (ref 30.0–36.0)
MCV: 95.2 fL (ref 78.0–100.0)
PLATELETS: 240 10*3/uL (ref 150–400)
RBC: 2.49 MIL/uL — ABNORMAL LOW (ref 4.22–5.81)
RDW: 14.6 % (ref 11.5–15.5)
WBC: 10.2 10*3/uL (ref 4.0–10.5)

## 2013-07-10 LAB — BASIC METABOLIC PANEL
BUN: 17 mg/dL (ref 6–23)
CALCIUM: 9.9 mg/dL (ref 8.4–10.5)
CO2: 28 mEq/L (ref 19–32)
CREATININE: 7.22 mg/dL — AB (ref 0.50–1.35)
Chloride: 97 mEq/L (ref 96–112)
GFR calc non Af Amer: 8 mL/min — ABNORMAL LOW (ref >90)
GFR, EST AFRICAN AMERICAN: 9 mL/min — AB (ref >90)
Glucose, Bld: 104 mg/dL — ABNORMAL HIGH (ref 70–99)
Potassium: 4.3 mEq/L (ref 3.7–5.3)
Sodium: 138 mEq/L (ref 137–147)

## 2013-07-10 LAB — CULTURE, BLOOD (ROUTINE X 2)

## 2013-07-10 LAB — LEGIONELLA ANTIGEN, URINE: LEGIONELLA ANTIGEN, URINE: NEGATIVE

## 2013-07-10 LAB — FOLATE: FOLATE: 3.8 ng/mL

## 2013-07-10 LAB — PREPARE RBC (CROSSMATCH)

## 2013-07-10 LAB — IRON AND TIBC
IRON: 58 ug/dL (ref 42–135)
Saturation Ratios: 35 % (ref 20–55)
TIBC: 164 ug/dL — ABNORMAL LOW (ref 215–435)
UIBC: 106 ug/dL — AB (ref 125–400)

## 2013-07-10 LAB — FERRITIN: Ferritin: 1060 ng/mL — ABNORMAL HIGH (ref 22–322)

## 2013-07-10 LAB — ABO/RH: ABO/RH(D): O POS

## 2013-07-10 LAB — VITAMIN B12: Vitamin B-12: 541 pg/mL (ref 211–911)

## 2013-07-10 MED ORDER — HEPARIN SODIUM (PORCINE) 5000 UNIT/ML IJ SOLN
5000.0000 [IU] | Freq: Two times a day (BID) | INTRAMUSCULAR | Status: AC
  Administered 2013-07-11: 5000 [IU] via SUBCUTANEOUS
  Filled 2013-07-10 (×7): qty 1

## 2013-07-10 MED ORDER — CLONIDINE HCL 0.3 MG PO TABS
0.3000 mg | ORAL_TABLET | Freq: Two times a day (BID) | ORAL | Status: DC
  Administered 2013-07-10 – 2013-07-14 (×8): 0.3 mg via ORAL
  Filled 2013-07-10 (×12): qty 1

## 2013-07-10 NOTE — Progress Notes (Addendum)
[facility] PROGRESS NOTE  James Wall [facility] PYK:[id number] DOB: 05/31/1965 DOA: 07/08/2013 PCP: [name], MD  Assessment/Plan: 1. HCAP -continue Vanc/Zosyn -1/2 blood Bx -GPC in clusters, if not contaminant will repeat Blood Cx -suspected on repeat CXR yesterday  2. ESRD on HD with volume overload -non compliant and signs out before completing HD and Dietary non compliance too -Renal following -HD per renal  3. Hypertensive urgency -wean Nitro gtt, declined PO BP meds till late last pm per RN -change clonidine to PO, amlodipine, Hydralazine, lisinopril -expect BP to improve once volume status better  4. CAD with abnormal myoview -declined cath 6/1 since wanted this under general anesthesia -continue ASA/metoprolol/statin -Dr.Nelson put pre-cath orders yesterday, will clarify per Dr.Nelson if this is being planned for Monday  5. Anxiety -xanax PRN  6. Anemia of chronic disease -continue Aranesp -transfuse if <7, cbc in am -His baseline Hb is in 8 range from 11/14, last week fluctuated in 9-10range which is higher than his baseline -hemoccult stools, check anemia panel  DVT proph: add heparin  Code Status:Full Code Family Communication:none at bedside Disposition Plan: Keep in SDU   Consultants:  Renal  HPI/Subjective: Has a lot of complaints and excuses  No further chest pain or dyspnea Some cough, productive sputum  Objective: Filed Vitals:   07/10/13 0700  BP: 126/77  Pulse:   Temp:   Resp:     Intake/Output Summary (Last 24 hours) at 07/10/13 0817 Last data filed at 07/10/13 0700  Gross per 24 hour  Intake 1385.53 ml  Output   3010 ml  Net -1624.47 ml   Filed Weights   07/09/13 0213 07/09/13 1050 07/09/13 1407  Weight: 72.2 kg (159 lb 2.8 oz) 72.4 kg (159 lb 9.8 oz) 69.4 kg (153 lb)    Exam:   General:  AAOx3,   Cardiovascular: S1S2/RRR  Respiratory: basilar ronchi imporved  Abdomen: soft, Nt, BS  present  Musculoskeletal:no edema c/c  Data Reviewed: Basic Metabolic Panel:  Recent Labs Lab 07/04/13 1145 07/08/13 1336 07/09/13 0300 07/10/13 0240  NA 140 137 137 138  K 3.5* 4.2 4.1 4.3  CL 92* 90* 94* 97  CO2  --  27 28 28   GLUCOSE 99 101* 93 104*  BUN 20 24* 29* 17  CREATININE 7.80* 8.17* 9.83* 7.22*  CALCIUM  --  9.8 9.5 9.9  MG  --   --  2.0  --   PHOS  --   --  4.7*  --    Liver Function Tests:  Recent Labs Lab 07/08/13 1336 07/09/13 0300  AST 13 9  ALT 5 <5  ALKPHOS 83 61  BILITOT 1.2 0.8  PROT 8.6* 7.0  ALBUMIN 3.2* 2.6*   No results found for this basename: LIPASE, AMYLASE,  in the last 168 hours No results found for this basename: AMMONIA,  in the last 168 hours CBC:  Recent Labs Lab 07/04/13 1134 07/04/13 1145 07/08/13 1336 07/09/13 0300 07/09/13 0655 07/10/13 0240  WBC 10.8*  --  9.5 7.8  --  10.2  NEUTROABS 8.6*  --  7.5 5.7  --   --   HGB 10.3* 13.6 9.8* 7.8* 7.2* 7.4*  HCT 31.5* 40.0 30.6* 25.0* 22.7* 23.7*  MCV 93.2  --  93.0 95.1  --  95.2  PLT 265  --  276 213 215 240   Cardiac Enzymes:  Recent Labs Lab 07/09/13 0300 07/09/13 0655  TROPONINI <0.30 <0.30   BNP (last 3 results)  Recent Labs  07/09/13 0300  PROBNP >70000.0*   CBG: No results found for this basename: GLUCAP,  in the last 168 hours  Recent Results (from the past 240 hour(s))  CULTURE, BLOOD (ROUTINE X 2)     Status: None   Collection Time    07/08/13  6:00 PM      Result Value Ref Range Status   Specimen Description BLOOD LEFT ARM   Final   Special Requests BOTTLES DRAWN AEROBIC AND ANAEROBIC 10CC   Final   Culture  Setup Time     Final   Value: 07/08/2013 22:39     Performed at [facility]   Culture     Final   Value:        BLOOD CULTURE RECEIVED NO GROWTH TO DATE CULTURE WILL BE HELD FOR 5 DAYS BEFORE ISSUING A FINAL NEGATIVE REPORT     Performed at [facility]   Report Status PENDING   Incomplete  CULTURE, BLOOD (ROUTINE X 2)      Status: None   Collection Time    07/08/13  6:10 PM      Result Value Ref Range Status   Specimen Description BLOOD LEFT HAND   Final   Special Requests BOTTLES DRAWN AEROBIC ONLY [facility]   Final   Culture  Setup Time     Final   Value: 07/08/2013 22:38     Performed at [facility]   Culture     Final   Value: GRAM POSITIVE COCCI IN CLUSTERS     Note: Gram Stain Report Called to,Read Back By and Verified With: ERIN SMITH 07/09/13 @ 5:53PM BY RUSCOE A.     Performed at [facility]   Report Status PENDING   Incomplete  MRSA PCR SCREENING     Status: None   Collection Time    07/09/13  1:58 AM      Result Value Ref Range Status   MRSA by PCR NEGATIVE  NEGATIVE Final   Comment:            The GeneXpert MRSA Assay (FDA     approved for NASAL specimens     only), is one component of a     comprehensive MRSA colonization     surveillance program. It is not     intended to diagnose MRSA     infection nor to guide or     monitor treatment for     MRSA infections.     Studies: Dg Chest 2 View  07/08/2013   CLINICAL DATA:  Fever and chest pain  EXAM: CHEST  2 VIEW  COMPARISON:  07/04/2013  FINDINGS: Cardiac shadow is stable. The mild vascular congestion seen on the prior exam is stable. A dialysis catheter is again seen. No focal infiltrate or sizable effusion is noted. No bony abnormality is seen.  IMPRESSION: No change from the prior exam. Stable mild vascular congestion is seen.   Electronically Signed   By: Inez Catalina M.D.   On: 07/08/2013 13:24   Dg Chest Port 1 View  07/09/2013   CLINICAL DATA:  Shortness of breath.  EXAM: PORTABLE CHEST - 1 VIEW  COMPARISON:  07/08/2013  FINDINGS: Dual-lumen right IJ central venous catheter is unchanged. Lungs are hypoinflated with worsening hazy opacification in the left base likely due to a small effusion with associated atelectasis. There is subtle worsening hazy opacification in the right base likely atelectasis. Cannot exclude  infection in the lung bases. There  are stable cardiomegaly. Remainder the exam is unchanged.  IMPRESSION: Worsening bibasilar opacification left worse than right. Findings may be due to bibasilar atelectasis with small left effusion. Cannot exclude infection in the lung bases.  Left IJ central venous catheter unchanged.   Electronically Signed   By: Marin Olp M.D.   On: 07/09/2013 08:26    Scheduled Meds: . amLODipine  10 mg Oral QHS  . atorvastatin  20 mg Oral Daily  . cloNIDine  0.3 mg Oral BID  . [START ON 07/13/2013] darbepoetin (ARANESP) injection - DIALYSIS  100 mcg Intravenous Q Wed-HD  . [START ON 07/11/2013] doxercalciferol  5 mcg Intravenous Q M,W,F-HD  . [START ON 07/13/2013] ferric gluconate (FERRLECIT/NULECIT) IV  62.5 mg Intravenous Q Wed-HD  . gabapentin  100 mg Oral TID  . hydrALAZINE  50 mg Oral 3 times per day  . isosorbide mononitrate  30 mg Oral Daily  . lisinopril  40 mg Oral Daily  . metoprolol succinate  25 mg Oral Daily  . piperacillin-tazobactam (ZOSYN)  IV  2.25 g Intravenous 3 times per day  . sevelamer carbonate  2,400-5,600 mg Oral TID WC  . sodium chloride  3 mL Intravenous Q12H  . [START ON 07/11/2013] vancomycin  750 mg Intravenous Q M,W,F-HD   Continuous Infusions: . sodium chloride 10 mL/hr at 07/09/13 2200  . sodium chloride    . nitroGLYCERIN 90 mcg/min (07/10/13 0700)   Antibiotics Given (last 72 hours)   Date/Time Action Medication Dose Rate   07/09/13 0322 Given   piperacillin-tazobactam (ZOSYN) IVPB 2.25 g 2.25 g 100 mL/hr   07/09/13 0350 Given   vancomycin (VANCOCIN) 1,500 mg in sodium chloride 0.9 % 500 mL IVPB 1,500 mg 250 mL/hr   07/09/13 2341 Given   piperacillin-tazobactam (ZOSYN) IVPB 2.25 g 2.25 g 100 mL/hr   07/10/13 0659 Given   piperacillin-tazobactam (ZOSYN) IVPB 2.25 g 2.25 g 100 mL/hr      Principal Problem:   Acute respiratory failure with hypoxia Active Problems:   End stage renal disease   GERD (gastroesophageal reflux  disease)   Anemia of renal disease   Abnormal stress test   Chest pain at rest   HCAP (healthcare-associated pneumonia)     Time spent: 5mn    Toriana Sponsel  [facility] Pager [phone] If 7PM-7AM, please contact night-coverage at [email or web address], password [email or web address]6/08/2013, 8:17 AM  LOS: 2 days

## 2013-07-10 NOTE — Progress Notes (Signed)
Subjective:  Multiple complains about hospital stay, including a missed meal yesterday; otherwise, feels okay  Objective: Vital signs in last 24 hours: Temp:  [98.2 F (36.8 C)-100.4 F (38 C)] 98.2 F (36.8 C) (06/07 0800) Pulse Rate:  [84-115] 102 (06/07 0300) Resp:  [16-36] 17 (06/07 0800) BP: (120-224)/(64-116) 124/78 mmHg (06/07 0800) SpO2:  [97 %-100 %] 98 % (06/07 0800) Weight:  [69.4 kg (153 lb)-72.4 kg (159 lb 9.8 oz)] 69.4 kg (153 lb) (06/06 1407) Weight change: 0.2 kg (7.1 oz)  Intake/Output from previous day: 06/06 0701 - 06/07 0700 In: 1477.5 [P.O.:240; I.V.:1137.5; IV Piggyback:100] Out: 3010 [Urine:150] Intake/Output this shift: Total I/O In: 238.8 [P.O.:180; I.V.:58.8] Out: -   Lab Results:  Recent Labs  07/09/13 0300 07/09/13 0655 07/10/13 0240  WBC 7.8  --  10.2  HGB 7.8* 7.2* 7.4*  HCT 25.0* 22.7* 23.7*  PLT 213 215 240   BMET:  Recent Labs  07/08/13 1336 07/09/13 0300 07/10/13 0240  NA 137 137 138  K 4.2 4.1 4.3  CL 90* 94* 97  CO2 27 28 28   GLUCOSE 101* 93 104*  BUN 24* 29* 17  CREATININE 8.17* 9.83* 7.22*  CALCIUM 9.8 9.5 9.9  ALBUMIN 3.2* 2.6*  --    No results found for this basename: PTH,  in the last 72 hours Iron Studies: No results found for this basename: IRON, TIBC, TRANSFERRIN, FERRITIN,  in the last 72 hours  Studies/Results: Dg Chest Port 1 View  07/09/2013   CLINICAL DATA:  Shortness of breath.  EXAM: PORTABLE CHEST - 1 VIEW  COMPARISON:  07/08/2013  FINDINGS: Dual-lumen right IJ central venous catheter is unchanged. Lungs are hypoinflated with worsening hazy opacification in the left base likely due to a small effusion with associated atelectasis. There is subtle worsening hazy opacification in the right base likely atelectasis. Cannot exclude infection in the lung bases. There are stable cardiomegaly. Remainder the exam is unchanged.  IMPRESSION: Worsening bibasilar opacification left worse than right. Findings may be due to  bibasilar atelectasis with small left effusion. Cannot exclude infection in the lung bases.  Left IJ central venous catheter unchanged.   Electronically Signed   By: Marin Olp M.D.   On: 07/09/2013 08:26   EXAM: General appearance:  Alert, in no apparent distress Resp:  Bibasilar rales, mild coarseness Cardio:  RRR without murmur or rub GI: + BS, soft and nontender Extremities:  No edema Access: L IJ catheter, AVF @ RUA with + bruit  HD: [place] [place]  4h 73.5kg RUA AVF 450/A1.5 Heparin 1800  Aranesp 100 ug q Wed Venofer 50/wed Hect 5 mcg  pth 115, TSat 33%   Assessment/Plan: 1. SOB/Hypoxemia - Bibasilar opacification (L > R) per CXR, also fever, suggesting PNA; also possibly volume excess sec to missed HD and early sign offs; Vancomycin & Zosyn. 2. Positive stress test - 5/14 with mid-to distal anterior wall ischemia; EKG showed no evidence of ischemia, troponins negative.  Cardiac catheterization pending. 3. ESRD - HD on MWF @ [place], missed HD 6/3 and rarely runs > 3 hrs, including yesterday in hospital; K 4.3.  Next HD tomorrow. 4. HTN/Volume - BP 124/78 currently on NTG drip. also Amlodipine, Clonidine, Hydralazine, Lisinopril, Metoprolol; wt 69.4 kg s/p net UF 2.9 L in 3 hrs yesterday. BP still up after HD yest, on IV NTG w better control. He is below dry wt, still some extra volume.  5. Anemia - Hgb down to 7.4, Aranesp 100 mcg on  Wed, Fe qwk.  CBC pre-HD tomorrow. 6. Sec HPT - Hectorol 5 mcg, Renvela 3 with meals. 7. Nutrition - renal diet, but no vitamin on list. 8. Anxiety/Depression - outpatient Xanax 1 mg tid. 9. Hx cirrhosis/ascites - unclear cause.   LOS: 2 days   Ramiro Harvest 07/10/2013,10:08 AM  Pt seen, examined and agree w A/P as above. Anemia worse, will transfuse with HD tomorrow. Kelly Splinter MD pager [phone]    cell [phone] 07/10/2013, 12:46 PM

## 2013-07-10 NOTE — Consult Note (Signed)
Reason for Consult: chest pain, cardiac cath   Referring Physician: Dr. Broadus John PCP: [name], MD Primary Cardiologist:Dr. Winnifred Friar James Wall is an 48 y.o. male.    Chief Complaint: Pt admitted 07/08/13  With chest pain, he had been seen by Dr. Burt Knack 07/04/13 in ER but did not wish cardiac cath he went home and was then back and admitted on the 5th.  HPI: 49 year old African American male with past medical history significant for hypertension, end-stage renal disease on HD MWF, COPD and history of coronary disease. The patient also has a diagnosis of liver cirrhosis of unknown etiology. His last echo on 12/23/2012 showed preserved EF of 60-65%. He was seen by Dr. Meda Coffee in the clinic on 05/31/2013 complaining of fatigue, shortness of breath with minimal exertion, and weight loss. He also experienced palpitations associated with shortness of breath at the end of his dialysis for which he take PRN metoprolol. EKG was abnormal for T wave inversion in lateral leads. Given his symptoms, a 2 day lexiscan stress test was obtained on 06/16/2013 which showed mild decreased uptake of moderate-sized along the anterior wall distribution, mid to distal anterior wall ischemia. Patient was originally scheduled to undergo outpatient cardiac catheterization on 06/28/2013 however after extensive discussion of regarding we prefer to go from the femoral approach given his h/o bilateral UE AVF, patient declined to undergo the cardiac cath without full anesthesia because his past bad experience of experiencing pain despite moderate sedation. The procedure was eventually canceled.  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 A new AV fistula was placed in the right forearm. Patient has been doing exercise to help with the maturation of the right forearm AV fistula. He  states he was trying to lift up a can of chili beans on 07/01/2013. He think he may have overdid himself because starting on 07/02/2013, he began to experiencing right chest stiffness radiating down the right arm. He also experienced some fatigue, weakness, and a productive cough with thick whitish stringy sputum. He says for the past 3 days, he has been using his left arm to lift things as every time he used right arm he felt increasing stiffness. Lifting the right arm causes worsening pain, however the pain does not subside no matter which side he sleeps on. The stiffness and pain never went away, however it was wax and waning. He states he barely slept in the past 24 hours. He was seen on 07/04/13 for chest pain with dialysis and Dr. Burt Knack felt he could be cathed with liberal use of fentanyl.  Dr. Burt Knack stated "I think the risk of general anesthesia is greater than the risk of a diagnostic cath and I would not recommend this" the pt was not interested in cath at that time.   Pt was admitted 07/08/13 after presenting with chest pain again.  He complained of SOB, chest pain and fever.   The chest pain is located on the right side and he is a sharp pain which gets worse when he takes a deep breath and when he has a dry cough.  He mentions that he has been having some generalized weakness and has fallen multiple times. His last fall was on Sunday when he fell on his knee and had severe pain in his knee. He also has  some pain in his neck and head since last few weeks. The pain in the neck is more on the right side along his shoulder. He has some take off which has been on and off since last few months.  In ER he was hypoxic requiring 2 L [place] of oxygen felt to be bronchiolitis.   He was also hypertensive.  Yesterday he underwent 3 hr of a 4 hr dialysis.  Did not wish to stay on longer.  His labs are negative for MI with troponin <0.30.  He is anemic H/H 7.4/23.7 he is on aranesp.  He was 10.3 on 07/04/13.  D dimer was  elevated at 5.91.  CXR with worsening pictures. EKG ST with non specific ST changes, though not acute changes from the 1st of June.      Past Medical History  Diagnosis Date  . ESRD on hemodialysis     Started HD Jan 2009.  ESRD was due to HTN.  Dx'd with HTN in hospital 1996 according to pt, they had to keep him so he could get Medicaid to afford the BP medications.  First saw a nephrologist and started HD in the same year 2009.  Gets HD at [facility] on a MWF schedule.  Does not have DM. He had a left RC AVF that never functioned, a left upper arm AVF that worked for about 5 years and as of June  . Hypertension   . COPD (chronic obstructive pulmonary disease)   . Arthritis     GOUT  . Anemia   . Secondary hyperparathyroidism   . Anxiety   . Depression   . Shortness of breath     With exertion  . GERD (gastroesophageal reflux disease)   . Sepsis 02/2013    from AVF , treated with Vancomycin.    Past Surgical History  Procedure Laterality Date  . Av fistula placement  2009    Left lower arm AVF  . Ligation of arteriovenous  fistula Left 12/22/2012    Procedure: LIGATION OF ARTERIOVENOUS  FISTULA;EXCISION OF LARGE ANEURYSMS;;  Surgeon: Elam Dutch, MD;  Location: [place];  Service: Vascular;  Laterality: Left;  . Insertion of dialysis catheter N/A 12/23/2012    Procedure: INSERTION OF DIALYSIS CATHETER; ULTRASOUND GUIDED;  Surgeon: Angelia Mould, MD;  Location: [place];  Service: Vascular;  Laterality: N/A;  . Av fistula placement Right 02/22/2013    Procedure:  CREATION  OF BRACHIAL CEPHALIC FISTULA RIGHT ARM;  Surgeon: Elam Dutch, MD;  Location: [place];  Service: Vascular;  Laterality: Right;  . Esophagogastroduodenoscopy (egd) with propofol N/A 04/12/2013    Procedure: ESOPHAGOGASTRODUODENOSCOPY (EGD) WITH PROPOFOL;  Surgeon: Arta Silence, MD;  Location: [facility];  Service: Endoscopy;  Laterality: N/A;    Family History  Problem Relation Age of Onset  .  Stroke Father    Social History:  reports that he has been smoking Cigarettes.  He has a 8 pack-year smoking history. He has never used smokeless tobacco. He reports that he drinks alcohol. He reports that he does not use illicit drugs.  Allergies:  Allergies  Allergen Reactions  . Aspirin     Tends to make him "feel worse" when he tries to take it for a cold or something    Medications Prior to Admission  Medication Sig Dispense Refill  . ALPRAZolam (XANAX) 1 MG tablet Take 1 mg by mouth at bedtime as needed for anxiety.      Marland Kitchen amLODipine (NORVASC)  10 MG tablet Take 10 mg by mouth at bedtime.       Marland Kitchen atorvastatin (LIPITOR) 20 MG tablet Take 1 tablet (20 mg total) by mouth daily.  90 tablet  3  . cloNIDine (CATAPRES - DOSED IN MG/24 HR) 0.3 mg/24hr Place 1 patch onto the skin once a week. Saturday.      . cyclobenzaprine (FLEXERIL) 10 MG tablet Take 1 tablet (10 mg total) by mouth 2 (two) times daily as needed for muscle spasms.  10 tablet  0  . esomeprazole (NEXIUM) 40 MG capsule Take 40-80 mg by mouth daily as needed (reflux/indigestion. Dose varies depending on severity.).       Marland Kitchen gabapentin (NEURONTIN) 100 MG capsule Take 100 mg by mouth 3 (three) times daily.       Marland Kitchen HYDROcodone-acetaminophen (NORCO/VICODIN) 5-325 MG per tablet Take 1 tablet by mouth 3 (three) times daily as needed for moderate pain or severe pain.  6 tablet  0  . isosorbide mononitrate (IMDUR) 30 MG 24 hr tablet Take 1 tablet (30 mg total) by mouth daily.  90 tablet  3  . lisinopril (PRINIVIL,ZESTRIL) 40 MG tablet Take 40 mg by mouth at bedtime.       . metoprolol succinate (TOPROL-XL) 25 MG 24 hr tablet Take 1 tablet (25 mg total) by mouth daily.  30 tablet  6  . sevelamer carbonate (RENVELA) 800 MG tablet Take 2,400-5,600 mg by mouth 3 (three) times daily with meals. Takes 3-4 tablets if the meal is light and 6-7 tablets if he eats a big meal.        Results for orders placed during the hospital encounter of  07/08/13 (from the past 48 hour(s))  CBC WITH DIFFERENTIAL     Status: Abnormal   Collection Time    07/08/13  1:36 PM      Result Value Ref Range   WBC 9.5  4.0 - 10.5 K/uL   RBC 3.29 (*) 4.22 - 5.81 MIL/uL   Hemoglobin 9.8 (*) 13.0 - 17.0 g/dL   HCT 30.6 (*) 39.0 - 52.0 %   MCV 93.0  78.0 - 100.0 fL   MCH 29.8  26.0 - 34.0 pg   MCHC 32.0  30.0 - 36.0 g/dL   RDW 14.2  11.5 - 15.5 %   Platelets 276  150 - 400 K/uL   Neutrophils Relative % 79 (*) 43 - 77 %   Neutro Abs 7.5  1.7 - 7.7 K/uL   Lymphocytes Relative 11 (*) 12 - 46 %   Lymphs Abs 1.0  0.7 - 4.0 K/uL   Monocytes Relative 8  3 - 12 %   Monocytes Absolute 0.8  0.1 - 1.0 K/uL   Eosinophils Relative 2  0 - 5 %   Eosinophils Absolute 0.2  0.0 - 0.7 K/uL   Basophils Relative 0  0 - 1 %   Basophils Absolute 0.0  0.0 - 0.1 K/uL  COMPREHENSIVE METABOLIC PANEL     Status: Abnormal   Collection Time    07/08/13  1:36 PM      Result Value Ref Range   Sodium 137  137 - 147 mEq/L   Potassium 4.2  3.7 - 5.3 mEq/L   Chloride 90 (*) 96 - 112 mEq/L   CO2 27  19 - 32 mEq/L   Glucose, Bld 101 (*) 70 - 99 mg/dL   BUN 24 (*) 6 - 23 mg/dL   Creatinine, Ser 8.17 (*) 0.50 - 1.35 mg/dL  Calcium 9.8  8.4 - 10.5 mg/dL   Total Protein 8.6 (*) 6.0 - 8.3 g/dL   Albumin 3.2 (*) 3.5 - 5.2 g/dL   AST 13  0 - 37 U/L   ALT 5  0 - 53 U/L   Alkaline Phosphatase 83  39 - 117 U/L   Total Bilirubin 1.2  0.3 - 1.2 mg/dL   GFR calc non Af Amer 7 (*) >90 mL/min   GFR calc Af Amer 8 (*) >90 mL/min   Comment: (NOTE)     The eGFR has been calculated using the CKD EPI equation.     This calculation has not been validated in all clinical situations.     eGFR's persistently <90 mL/min signify possible Chronic Kidney     Disease.  Randolm Idol, ED     Status: None   Collection Time    07/08/13  2:06 PM      Result Value Ref Range   Troponin i, poc 0.03  0.00 - 0.08 ng/mL   Comment 3            Comment: Due to the release kinetics of cTnI,     a  negative result within the first hours     of the onset of symptoms does not rule out     myocardial infarction with certainty.     If myocardial infarction is still suspected,     repeat the test at appropriate intervals.  I-STAT CG4 LACTIC ACID, ED     Status: None   Collection Time    07/08/13  4:17 PM      Result Value Ref Range   Lactic Acid, Venous 1.15  0.5 - 2.2 mmol/L  CULTURE, BLOOD (ROUTINE X 2)     Status: None   Collection Time    07/08/13  6:00 PM      Result Value Ref Range   Specimen Description BLOOD LEFT ARM     Special Requests BOTTLES DRAWN AEROBIC AND ANAEROBIC 10CC     Culture  Setup Time       Value: 07/08/2013 22:39     Performed at [facility]   Culture       Value:        BLOOD CULTURE RECEIVED NO GROWTH TO DATE CULTURE WILL BE HELD FOR 5 DAYS BEFORE ISSUING A FINAL NEGATIVE REPORT     Performed at [facility]   Report Status PENDING    CULTURE, BLOOD (ROUTINE X 2)     Status: None   Collection Time    07/08/13  6:10 PM      Result Value Ref Range   Specimen Description BLOOD LEFT HAND     Special Requests BOTTLES DRAWN AEROBIC ONLY [place]     Culture  Setup Time       Value: 07/08/2013 22:38     Performed at [facility]   Culture       Value: STAPHYLOCOCCUS SPECIES (COAGULASE NEGATIVE)     Note: THE SIGNIFICANCE OF ISOLATING THIS ORGANISM FROM A SINGLE SET OF BLOOD CULTURES WHEN MULTIPLE SETS ARE DRAWN IS UNCERTAIN. PLEASE NOTIFY THE MICROBIOLOGY DEPARTMENT WITHIN ONE WEEK IF SPECIATION AND SENSITIVITIES ARE REQUIRED.     Note: Gram Stain Report Called to,Read Back By and Verified With: ERIN SMITH 07/09/13 @ 5:53PM BY RUSCOE A.     Performed at [facility]   Report Status 07/10/2013 FINAL    I-STAT ARTERIAL BLOOD GAS, ED  Status: Abnormal   Collection Time    07/09/13 12:29 AM      Result Value Ref Range   pH, Arterial 7.491 (*) 7.350 - 7.450   pCO2 arterial 41.2  35.0 - 45.0 mmHg   pO2, Arterial 62.0 (*) 80.0 -  100.0 mmHg   Bicarbonate 31.5 (*) 20.0 - 24.0 mEq/L   TCO2 33  0 - 100 mmol/L   O2 Saturation 93.0     Acid-Base Excess 7.0 (*) 0.0 - 2.0 mmol/L   Patient temperature 98.6 F     Collection site RADIAL, ALLEN'S TEST ACCEPTABLE     Drawn by RT     Sample type ARTERIAL    MRSA PCR SCREENING     Status: None   Collection Time    07/09/13  1:58 AM      Result Value Ref Range   MRSA by PCR NEGATIVE  NEGATIVE   Comment:            The GeneXpert MRSA Assay (FDA     approved for NASAL specimens     only), is one component of a     comprehensive MRSA colonization     surveillance program. It is not     intended to diagnose MRSA     infection nor to guide or     monitor treatment for     MRSA infections.  COMPREHENSIVE METABOLIC PANEL     Status: Abnormal   Collection Time    07/09/13  3:00 AM      Result Value Ref Range   Sodium 137  137 - 147 mEq/L   Potassium 4.1  3.7 - 5.3 mEq/L   Chloride 94 (*) 96 - 112 mEq/L   CO2 28  19 - 32 mEq/L   Glucose, Bld 93  70 - 99 mg/dL   BUN 29 (*) 6 - 23 mg/dL   Creatinine, Ser 9.83 (*) 0.50 - 1.35 mg/dL   Calcium 9.5  8.4 - 10.5 mg/dL   Total Protein 7.0  6.0 - 8.3 g/dL   Albumin 2.6 (*) 3.5 - 5.2 g/dL   AST 9  0 - 37 U/L   ALT <5  0 - 53 U/L   Alkaline Phosphatase 61  39 - 117 U/L   Total Bilirubin 0.8  0.3 - 1.2 mg/dL   GFR calc non Af Amer 5 (*) >90 mL/min   GFR calc Af Amer 6 (*) >90 mL/min   Comment: (NOTE)     The eGFR has been calculated using the CKD EPI equation.     This calculation has not been validated in all clinical situations.     eGFR's persistently <90 mL/min signify possible Chronic Kidney     Disease.  CBC WITH DIFFERENTIAL     Status: Abnormal   Collection Time    07/09/13  3:00 AM      Result Value Ref Range   WBC 7.8  4.0 - 10.5 K/uL   RBC 2.63 (*) 4.22 - 5.81 MIL/uL   Hemoglobin 7.8 (*) 13.0 - 17.0 g/dL   Comment: REPEATED TO VERIFY   HCT 25.0 (*) 39.0 - 52.0 %   MCV 95.1  78.0 - 100.0 fL   MCH 29.7  26.0 -  34.0 pg   MCHC 31.2  30.0 - 36.0 g/dL   RDW 14.4  11.5 - 15.5 %   Platelets 213  150 - 400 K/uL   Neutrophils Relative % 73  43 - 77 %  Lymphocytes Relative 13  12 - 46 %   Monocytes Relative 9  3 - 12 %   Eosinophils Relative 5  0 - 5 %   Basophils Relative 0  0 - 1 %   Neutro Abs 5.7  1.7 - 7.7 K/uL   Lymphs Abs 1.0  0.7 - 4.0 K/uL   Monocytes Absolute 0.7  0.1 - 1.0 K/uL   Eosinophils Absolute 0.4  0.0 - 0.7 K/uL   Basophils Absolute 0.0  0.0 - 0.1 K/uL  PROTIME-INR     Status: None   Collection Time    07/09/13  3:00 AM      Result Value Ref Range   Prothrombin Time 13.7  11.6 - 15.2 seconds   INR 1.07  0.00 - 1.49  APTT     Status: Abnormal   Collection Time    07/09/13  3:00 AM      Result Value Ref Range   aPTT 40 (*) 24 - 37 seconds   Comment:            IF BASELINE aPTT IS ELEVATED,     SUGGEST PATIENT RISK ASSESSMENT     BE USED TO DETERMINE APPROPRIATE     ANTICOAGULANT THERAPY.  PRO B NATRIURETIC PEPTIDE     Status: Abnormal   Collection Time    07/09/13  3:00 AM      Result Value Ref Range   Pro B Natriuretic peptide (BNP) >70000.0 (*) 0 - 125 pg/mL  PHOSPHORUS     Status: Abnormal   Collection Time    07/09/13  3:00 AM      Result Value Ref Range   Phosphorus 4.7 (*) 2.3 - 4.6 mg/dL  MAGNESIUM     Status: None   Collection Time    07/09/13  3:00 AM      Result Value Ref Range   Magnesium 2.0  1.5 - 2.5 mg/dL  TROPONIN I     Status: None   Collection Time    07/09/13  3:00 AM      Result Value Ref Range   Troponin I <0.30  <0.30 ng/mL   Comment:            Due to the release kinetics of cTnI,     a negative result within the first hours     of the onset of symptoms does not rule out     myocardial infarction with certainty.     If myocardial infarction is still suspected,     repeat the test at appropriate intervals.  TSH     Status: None   Collection Time    07/09/13  3:09 AM      Result Value Ref Range   TSH 3.860  0.350 - 4.500 uIU/mL    INFLUENZA PANEL BY PCR (TYPE A & B, H1N1)     Status: None   Collection Time    07/09/13  3:26 AM      Result Value Ref Range   Influenza A By PCR NEGATIVE  NEGATIVE   Influenza B By PCR NEGATIVE  NEGATIVE   H1N1 flu by pcr NOT DETECTED  NOT DETECTED   Comment:            The Xpert Flu assay (FDA approved for     nasal aspirates or washes and     nasopharyngeal swab specimens), is     intended as an aid in the diagnosis of     influenza  and should not be used as     a sole basis for treatment.  TROPONIN I     Status: None   Collection Time    07/09/13  6:55 AM      Result Value Ref Range   Troponin I <0.30  <0.30 ng/mL   Comment:            Due to the release kinetics of cTnI,     a negative result within the first hours     of the onset of symptoms does not rule out     myocardial infarction with certainty.     If myocardial infarction is still suspected,     repeat the test at appropriate intervals.  DIC (DISSEMINATED INTRAVASCULAR COAGULATION) PANEL     Status: Abnormal   Collection Time    07/09/13  6:55 AM      Result Value Ref Range   Prothrombin Time 14.8  11.6 - 15.2 seconds   INR 1.19  0.00 - 1.49   aPTT 40 (*) 24 - 37 seconds   Comment:            IF BASELINE aPTT IS ELEVATED,     SUGGEST PATIENT RISK ASSESSMENT     BE USED TO DETERMINE APPROPRIATE     ANTICOAGULANT THERAPY.   Fibrinogen 542 (*) 204 - 475 mg/dL   D-Dimer, Quant 5.91 (*) 0.00 - 0.48 ug/mL-FEU   Comment:            AT THE INHOUSE ESTABLISHED CUTOFF     VALUE OF 0.48 ug/mL FEU,     THIS ASSAY HAS BEEN DOCUMENTED     IN THE LITERATURE TO HAVE     A SENSITIVITY AND NEGATIVE     PREDICTIVE VALUE OF AT LEAST     98 TO 99%.  THE TEST RESULT     SHOULD BE CORRELATED WITH     AN ASSESSMENT OF THE CLINICAL     PROBABILITY OF DVT / VTE.   Platelets 215  150 - 400 K/uL   Smear Review RARE SCHISTOCYTES    RETICULOCYTES     Status: Abnormal   Collection Time    07/09/13  6:55 AM      Result Value  Ref Range   Retic Ct Pct 3.9 (*) 0.4 - 3.1 %   RBC. 2.43 (*) 4.22 - 5.81 MIL/uL   Retic Count, Manual 94.8  19.0 - 186.0 K/uL  HEMOGLOBIN AND HEMATOCRIT, BLOOD     Status: Abnormal   Collection Time    07/09/13  6:55 AM      Result Value Ref Range   Hemoglobin 7.2 (*) 13.0 - 17.0 g/dL   HCT 22.7 (*) 39.0 - 52.0 %  STREP PNEUMONIAE URINARY ANTIGEN     Status: None   Collection Time    07/09/13  9:03 AM      Result Value Ref Range   Strep Pneumo Urinary Antigen NEGATIVE  NEGATIVE   Comment:            Infection due to S. pneumoniae     cannot be absolutely ruled out     since the antigen present     may be below the detection limit     of the test.  HEPATITIS B SURFACE ANTIGEN     Status: None   Collection Time    07/09/13 11:28 AM      Result Value Ref Range   Hepatitis B Surface Ag  NEGATIVE  NEGATIVE   Comment: Performed at [facility]  CBC     Status: Abnormal   Collection Time    07/10/13  2:40 AM      Result Value Ref Range   WBC 10.2  4.0 - 10.5 K/uL   RBC 2.49 (*) 4.22 - 5.81 MIL/uL   Hemoglobin 7.4 (*) 13.0 - 17.0 g/dL   HCT 23.7 (*) 39.0 - 52.0 %   MCV 95.2  78.0 - 100.0 fL   MCH 29.7  26.0 - 34.0 pg   MCHC 31.2  30.0 - 36.0 g/dL   RDW 14.6  11.5 - 15.5 %   Platelets 240  150 - 400 K/uL  BASIC METABOLIC PANEL     Status: Abnormal   Collection Time    07/10/13  2:40 AM      Result Value Ref Range   Sodium 138  137 - 147 mEq/L   Potassium 4.3  3.7 - 5.3 mEq/L   Chloride 97  96 - 112 mEq/L   CO2 28  19 - 32 mEq/L   Glucose, Bld 104 (*) 70 - 99 mg/dL   BUN 17  6 - 23 mg/dL   Comment: DELTA CHECK NOTED   Creatinine, Ser 7.22 (*) 0.50 - 1.35 mg/dL   Calcium 9.9  8.4 - 10.5 mg/dL   GFR calc non Af Amer 8 (*) >90 mL/min   GFR calc Af Amer 9 (*) >90 mL/min   Comment: (NOTE)     The eGFR has been calculated using the CKD EPI equation.     This calculation has not been validated in all clinical situations.     eGFR's persistently <90 mL/min signify  possible Chronic Kidney     Disease.   Dg Chest 2 View  07/08/2013   CLINICAL DATA:  Fever and chest pain  EXAM: CHEST  2 VIEW  COMPARISON:  07/04/2013  FINDINGS: Cardiac shadow is stable. The mild vascular congestion seen on the prior exam is stable. A dialysis catheter is again seen. No focal infiltrate or sizable effusion is noted. No bony abnormality is seen.  IMPRESSION: No change from the prior exam. Stable mild vascular congestion is seen.   Electronically Signed   By: Inez Catalina M.D.   On: 07/08/2013 13:24   Dg Chest Port 1 View  07/09/2013   CLINICAL DATA:  Shortness of breath.  EXAM: PORTABLE CHEST - 1 VIEW  COMPARISON:  07/08/2013  FINDINGS: Dual-lumen right IJ central venous catheter is unchanged. Lungs are hypoinflated with worsening hazy opacification in the left base likely due to a small effusion with associated atelectasis. There is subtle worsening hazy opacification in the right base likely atelectasis. Cannot exclude infection in the lung bases. There are stable cardiomegaly. Remainder the exam is unchanged.  IMPRESSION: Worsening bibasilar opacification left worse than right. Findings may be due to bibasilar atelectasis with small left effusion. Cannot exclude infection in the lung bases.  Left IJ central venous catheter unchanged.   Electronically Signed   By: Marin Olp M.D.   On: 07/09/2013 08:26    ROS: General:no colds or fevers, no weight changes Skin:no rashes or ulcers HEENT:no blurred vision, no congestion CV:see HPI PUL:see HPI GI:no diarrhea constipation or melena, no indigestion, + vomiting on admit GU:no hematuria, no dysuria MS:no joint pain, no claudication Neuro:no syncope, no lightheadedness Endo:no diabetes, no thyroid disease   Blood pressure 124/78, pulse 102, temperature 98.2 F (36.8 C), temperature source Oral, resp. rate  17, height 5' 6"  (1.676 m), weight 153 lb (69.4 kg), SpO2 98.00%. PE: General:Pleasant affect, NAD, pk fever 100.4 last  pm Skin:Warm to hot and dry, brisk capillary refill HEENT:normocephalic, sclera clear, mucus membranes moist Neck:supple, no JVD, no bruits, IJ line on rt  Heart:S1S2 RRR without murmur, + [id number], no rub or click Lungs:clear, ant without rales, rhonchi, or wheezes QQV:ZDGL, non tender, + BS, do not palpate liver spleen or masses Ext:no lower ext edema, 2+ pedal pulses, 2+ radial pulses Neuro:alert and oriented, MAE, follows commands, + facial symmetry    Assessment/Plan Principal Problem:   Acute respiratory failure with hypoxia- improved Active Problems:   End stage renal disease- on HD   GERD (gastroesophageal reflux disease)   Anemia of renal disease- with decrease of H/H since admit IM will eval.   Abnormal stress test- will need cath once anemia evaluated, will not plan for 07/11/13.  Will use fentanyl for procedure, but MD planned for cath will need to discuss with Pt prior to procedure.  Tuesday would be earliest date   Chest pain at rest- negative MI   HCAP (healthcare-associated pneumonia) on zosyn and Vanc.   HTN urgency/accleration on NTG weaning-but still at high dose, on po clonidine, amlodipine, hydralazine and lisinopril  Dr. Jonnie Finner just in and discussed code status, pt wishes to discuss with family.   Cecilie Kicks  Nurse Practitioner Certified [place] Pager [phone] or after 5pm or weekends call [phone] 07/10/2013, 10:40 AM    I have seen and examined the patient along with Cecilie Kicks NP.  I have reviewed the chart, notes and new data.  I agree with NP's note.  Key new complaints: currently free of angina or dyspnea at rest Key examination changes: S4, no S3 or overt hypervolemia Key new findings / data: marked recent worsening of chronic anemia, unexplained  PLAN: Defer cath until anemia source is clarified and treated, especially since he is currently angina free.  Sanda Klein, MD, [place] [phone] 07/10/2013, 11:59 AM

## 2013-07-11 DIAGNOSIS — J189 Pneumonia, unspecified organism: Principal | ICD-10-CM

## 2013-07-11 LAB — CBC
HCT: 26.9 % — ABNORMAL LOW (ref 39.0–52.0)
HEMOGLOBIN: 8.4 g/dL — AB (ref 13.0–17.0)
MCH: 29.5 pg (ref 26.0–34.0)
MCHC: 31.2 g/dL (ref 30.0–36.0)
MCV: 94.4 fL (ref 78.0–100.0)
Platelets: 320 10*3/uL (ref 150–400)
RBC: 2.85 MIL/uL — ABNORMAL LOW (ref 4.22–5.81)
RDW: 15.1 % (ref 11.5–15.5)
WBC: 10.8 10*3/uL — ABNORMAL HIGH (ref 4.0–10.5)

## 2013-07-11 LAB — RENAL FUNCTION PANEL
Albumin: 2.5 g/dL — ABNORMAL LOW (ref 3.5–5.2)
BUN: 28 mg/dL — ABNORMAL HIGH (ref 6–23)
CO2: 28 meq/L (ref 19–32)
CREATININE: 9.93 mg/dL — AB (ref 0.50–1.35)
Calcium: 10.4 mg/dL (ref 8.4–10.5)
Chloride: 94 mEq/L — ABNORMAL LOW (ref 96–112)
GFR, EST AFRICAN AMERICAN: 6 mL/min — AB (ref >90)
GFR, EST NON AFRICAN AMERICAN: 5 mL/min — AB (ref >90)
Glucose, Bld: 122 mg/dL — ABNORMAL HIGH (ref 70–99)
PHOSPHORUS: 4.9 mg/dL — AB (ref 2.3–4.6)
Potassium: 4.1 mEq/L (ref 3.7–5.3)
Sodium: 139 mEq/L (ref 137–147)

## 2013-07-11 MED ORDER — HYDROCODONE-ACETAMINOPHEN 5-325 MG PO TABS
ORAL_TABLET | ORAL | Status: AC
  Filled 2013-07-11: qty 1

## 2013-07-11 MED ORDER — RENA-VITE PO TABS
1.0000 | ORAL_TABLET | Freq: Every day | ORAL | Status: DC
  Administered 2013-07-12: 1 via ORAL
  Filled 2013-07-11 (×5): qty 1

## 2013-07-11 MED ORDER — DOXERCALCIFEROL 4 MCG/2ML IV SOLN
INTRAVENOUS | Status: AC
  Administered 2013-07-11: 5 ug via INTRAVENOUS
  Filled 2013-07-11: qty 2

## 2013-07-11 MED ORDER — DOXERCALCIFEROL 4 MCG/2ML IV SOLN
INTRAVENOUS | Status: AC
  Administered 2013-07-11: 1 ug
  Filled 2013-07-11: qty 2

## 2013-07-11 NOTE — Progress Notes (Signed)
James Wall is at HD today and off the unit - plan per Dr. Loletha Grayer is to delay cath until anemia is improved. He is to receive blood today. No anginal complaints noted. Will continue to follow.  Pixie Casino, MD, [facility] Attending Cardiologist [place]

## 2013-07-11 NOTE — Progress Notes (Signed)
Patient back from dialysis, alert and oriented, denies any pain/distress. Vitals 98.7 161/86, 93 93%-RA scheduled medication given with some BP meds. Will continue to assess patient.

## 2013-07-11 NOTE — Procedures (Signed)
Pt seen on HD.  Ap 220  Vp 210.  BFR 400.  Tolerating HD well so far

## 2013-07-11 NOTE — Progress Notes (Signed)
S: breathing better.  No CP.  Eating OK O:BP 131/71  Pulse 76  Temp(Src) 97.3 F (36.3 C) (Oral)  Resp 16  Ht 5' 6"  (1.676 m)  Wt 69.4 kg (153 lb)  BMI 24.71 kg/m2  SpO2 97%  Intake/Output Summary (Last 24 hours) at 07/11/13 0801 Last data filed at 07/11/13 0656  Gross per 24 hour  Intake 1417.45 ml  Output      0 ml  Net 1417.45 ml   Weight change:  BCW:UGQBV and alert CVS:RRR Resp:clear Abd:+ BS NTND Ext:no edema  RUA AVF + bruit NEURO:CNI Ox3 No asterixis.   Marland Kitchen amLODipine  10 mg Oral QHS  . atorvastatin  20 mg Oral Daily  . cloNIDine  0.3 mg Oral BID  . [START ON 07/13/2013] darbepoetin (ARANESP) injection - DIALYSIS  100 mcg Intravenous Q Wed-HD  . doxercalciferol  5 mcg Intravenous Q M,W,F-HD  . [START ON 07/13/2013] ferric gluconate (FERRLECIT/NULECIT) IV  62.5 mg Intravenous Q Wed-HD  . gabapentin  100 mg Oral TID  . heparin subcutaneous  5,000 Units Subcutaneous Q12H  . hydrALAZINE  50 mg Oral 3 times per day  . isosorbide mononitrate  30 mg Oral Daily  . lisinopril  40 mg Oral Daily  . metoprolol succinate  25 mg Oral Daily  . piperacillin-tazobactam (ZOSYN)  IV  2.25 g Intravenous 3 times per day  . sevelamer carbonate  2,400-5,600 mg Oral TID WC  . sodium chloride  3 mL Intravenous Q12H  . vancomycin  750 mg Intravenous Q M,W,F-HD   No results found. BMET    Component Value Date/Time   NA 138 07/10/2013 0240   K 4.3 07/10/2013 0240   CL 97 07/10/2013 0240   CO2 28 07/10/2013 0240   GLUCOSE 104* 07/10/2013 0240   BUN 17 07/10/2013 0240   CREATININE 7.22* 07/10/2013 0240   CALCIUM 9.9 07/10/2013 0240   CALCIUM 7.7* 02/18/2007 1522   GFRNONAA 8* 07/10/2013 0240   GFRAA 9* 07/10/2013 0240   CBC    Component Value Date/Time   WBC 10.2 07/10/2013 0240   RBC 2.60* 07/10/2013 1500   RBC 2.49* 07/10/2013 0240   HGB 7.4* 07/10/2013 0240   HCT 23.7* 07/10/2013 0240   PLT 240 07/10/2013 0240   MCV 95.2 07/10/2013 0240   MCH 29.7 07/10/2013 0240   MCHC 31.2 07/10/2013 0240   RDW 14.6  07/10/2013 0240   LYMPHSABS 1.0 07/09/2013 0300   MONOABS 0.7 07/09/2013 0300   EOSABS 0.4 07/09/2013 0300   BASOSABS 0.0 07/09/2013 0300     Assessment: 1. PNA 2. CP with + stress test 3. HTN 4. Anemia on aranesp 5. Sec HPTH on hectorol 6. ESRD   Plan: 1. For HD today and to receive blood 2. start renavite 3. He is agreeable to ht cath 4. Could switch AB to PO   Windy Kalata

## 2013-07-11 NOTE — Progress Notes (Addendum)
[facility] PROGRESS NOTE  James Wall [facility] ZCH:[id number] DOB: August 25, 1965 DOA: 07/08/2013 PCP: [name], MD  Assessment/Plan: 1. HCAP -continue Vanc/Zosyn Day3, change to PO levaquin tomorrow -1/2 blood Bx -coagulase negative staph c/w contaminant -clinically improving  2. ESRD on HD with volume overload -non compliant and signs out before completing HD and Dietary non compliance too -Renal following -HD per renal today  3. Hypertensive urgency -off Nitro gtt -continue  clonidine PO, amlodipine, Hydralazine, lisinopril -BP much improved  4. CAD with abnormal myoview -declined cath 6/1 since wanted this under general anesthesia -continue ASA/metoprolol/statin -Cards following, plan for cath this admission  5. Anxiety -xanax PRN  6. Anemia of chronic disease -continue Aranesp, anemia panel c/w chronic disease -to be given 2 units in HD today --His baseline Hb is in 8 range from 11/14, last week fluctuated in 9-10range which is higher than his baseline -h/o EGD and colonoscopy last year per Dr.Outlaw-2 polyps removed otherwise benign per pt report, will request records  DVT proph: continue heparin  Code Status:Full Code Family Communication:none at bedside Disposition Plan: Tx to tle, Renal   Consultants:  Renal  HPI/Subjective: Feel better, no chest pain or dyspnea  Objective: Filed Vitals:   07/11/13 0300  BP: 111/49  Pulse: 70  Temp: 98.3 F (36.8 C)  Resp: 16    Intake/Output Summary (Last 24 hours) at 07/11/13 0754 Last data filed at 07/11/13 0656  Gross per 24 hour  Intake 1417.45 ml  Output      0 ml  Net 1417.45 ml   Filed Weights   07/09/13 0213 07/09/13 1050 07/09/13 1407  Weight: 72.2 kg (159 lb 2.8 oz) 72.4 kg (159 lb 9.8 oz) 69.4 kg (153 lb)    Exam:   General:  AAOx3,   Cardiovascular: S1S2/RRR  Respiratory: basilar ronchi improved  Abdomen: soft, Nt, BS present  Musculoskeletal:no edema c/c  Data  Reviewed: Basic Metabolic Panel:  Recent Labs Lab 07/04/13 1145 07/08/13 1336 07/09/13 0300 07/10/13 0240  NA 140 137 137 138  K 3.5* 4.2 4.1 4.3  CL 92* 90* 94* 97  CO2  --  27 28 28   GLUCOSE 99 101* 93 104*  BUN 20 24* 29* 17  CREATININE 7.80* 8.17* 9.83* 7.22*  CALCIUM  --  9.8 9.5 9.9  MG  --   --  2.0  --   PHOS  --   --  4.7*  --    Liver Function Tests:  Recent Labs Lab 07/08/13 1336 07/09/13 0300  AST 13 9  ALT 5 <5  ALKPHOS 83 61  BILITOT 1.2 0.8  PROT 8.6* 7.0  ALBUMIN 3.2* 2.6*   No results found for this basename: LIPASE, AMYLASE,  in the last 168 hours No results found for this basename: AMMONIA,  in the last 168 hours CBC:  Recent Labs Lab 07/04/13 1134 07/04/13 1145 07/08/13 1336 07/09/13 0300 07/09/13 0655 07/10/13 0240  WBC 10.8*  --  9.5 7.8  --  10.2  NEUTROABS 8.6*  --  7.5 5.7  --   --   HGB 10.3* 13.6 9.8* 7.8* 7.2* 7.4*  HCT 31.5* 40.0 30.6* 25.0* 22.7* 23.7*  MCV 93.2  --  93.0 95.1  --  95.2  PLT 265  --  276 213 215 240   Cardiac Enzymes:  Recent Labs Lab 07/09/13 0300 07/09/13 0655  TROPONINI <0.30 <0.30   BNP (last 3 results)  Recent Labs  07/09/13 0300  PROBNP >70000.0*   CBG:  No results found for this basename: GLUCAP,  in the last 168 hours  Recent Results (from the past 240 hour(s))  CULTURE, BLOOD (ROUTINE X 2)     Status: None   Collection Time    07/08/13  6:00 PM      Result Value Ref Range Status   Specimen Description BLOOD LEFT ARM   Final   Special Requests BOTTLES DRAWN AEROBIC AND ANAEROBIC 10CC   Final   Culture  Setup Time     Final   Value: 07/08/2013 22:39     Performed at [facility]   Culture     Final   Value:        BLOOD CULTURE RECEIVED NO GROWTH TO DATE CULTURE WILL BE HELD FOR 5 DAYS BEFORE ISSUING A FINAL NEGATIVE REPORT     Performed at [facility]   Report Status PENDING   Incomplete  CULTURE, BLOOD (ROUTINE X 2)     Status: None   Collection Time     07/08/13  6:10 PM      Result Value Ref Range Status   Specimen Description BLOOD LEFT HAND   Final   Special Requests BOTTLES DRAWN AEROBIC ONLY [facility]   Final   Culture  Setup Time     Final   Value: 07/08/2013 22:38     Performed at [facility]   Culture     Final   Value: STAPHYLOCOCCUS SPECIES (COAGULASE NEGATIVE)     Note: THE SIGNIFICANCE OF ISOLATING THIS ORGANISM FROM A SINGLE SET OF BLOOD CULTURES WHEN MULTIPLE SETS ARE DRAWN IS UNCERTAIN. PLEASE NOTIFY THE MICROBIOLOGY DEPARTMENT WITHIN ONE WEEK IF SPECIATION AND SENSITIVITIES ARE REQUIRED.     Note: Gram Stain Report Called to,Read Back By and Verified With: ERIN SMITH 07/09/13 @ 5:53PM BY RUSCOE A.     Performed at [facility]   Report Status 07/10/2013 FINAL   Final  MRSA PCR SCREENING     Status: None   Collection Time    07/09/13  1:58 AM      Result Value Ref Range Status   MRSA by PCR NEGATIVE  NEGATIVE Final   Comment:            The GeneXpert MRSA Assay (FDA     approved for NASAL specimens     only), is one component of a     comprehensive MRSA colonization     surveillance program. It is not     intended to diagnose MRSA     infection nor to guide or     monitor treatment for     MRSA infections.     Studies: No results found.  Scheduled Meds: . amLODipine  10 mg Oral QHS  . atorvastatin  20 mg Oral Daily  . cloNIDine  0.3 mg Oral BID  . [START ON 07/13/2013] darbepoetin (ARANESP) injection - DIALYSIS  100 mcg Intravenous Q Wed-HD  . doxercalciferol  5 mcg Intravenous Q M,W,F-HD  . [START ON 07/13/2013] ferric gluconate (FERRLECIT/NULECIT) IV  62.5 mg Intravenous Q Wed-HD  . gabapentin  100 mg Oral TID  . heparin subcutaneous  5,000 Units Subcutaneous Q12H  . hydrALAZINE  50 mg Oral 3 times per day  . isosorbide mononitrate  30 mg Oral Daily  . lisinopril  40 mg Oral Daily  . metoprolol succinate  25 mg Oral Daily  . piperacillin-tazobactam (ZOSYN)  IV  2.25 g Intravenous 3 times per day   .  sevelamer carbonate  2,400-5,600 mg Oral TID WC  . sodium chloride  3 mL Intravenous Q12H  . vancomycin  750 mg Intravenous Q M,W,F-HD   Continuous Infusions: . sodium chloride 10 mL/hr at 07/09/13 2200  . sodium chloride    . nitroGLYCERIN Stopped (07/10/13 1800)   Antibiotics Given (last 72 hours)   Date/Time Action Medication Dose Rate   07/09/13 0322 Given   piperacillin-tazobactam (ZOSYN) IVPB 2.25 g 2.25 g 100 mL/hr   07/09/13 0350 Given   vancomycin (VANCOCIN) 1,500 mg in sodium chloride 0.9 % 500 mL IVPB 1,500 mg 250 mL/hr   07/09/13 2341 Given   piperacillin-tazobactam (ZOSYN) IVPB 2.25 g 2.25 g 100 mL/hr   07/10/13 0659 Given   piperacillin-tazobactam (ZOSYN) IVPB 2.25 g 2.25 g 100 mL/hr   07/10/13 1439 Given   piperacillin-tazobactam (ZOSYN) IVPB 2.25 g 2.25 g 100 mL/hr   07/10/13 2252 Given   piperacillin-tazobactam (ZOSYN) IVPB 2.25 g 2.25 g 100 mL/hr   07/11/13 0656 Given   piperacillin-tazobactam (ZOSYN) IVPB 2.25 g 2.25 g 100 mL/hr      Principal Problem:   Acute respiratory failure with hypoxia Active Problems:   End stage renal disease   GERD (gastroesophageal reflux disease)   Anemia of renal disease   Abnormal stress test   Chest pain at rest   HCAP (healthcare-associated pneumonia)     Time spent: 34mn    Toshika Parrow  [facility] Pager [phone] If 7PM-7AM, please contact night-coverage at [email or web address], password [facility]6/09/2013, 7:54 AM  LOS: 3 days

## 2013-07-11 NOTE — Progress Notes (Signed)
Notified by [employer] tech that [patient heart rate is in the 30s/40s on getting to patient's room noted patient asleep, denies any pain/distress vitals 123/61, 81 93%-RA, while in the room patient's heart rate when back to the 70s/80s. Notified Dr. Broadus John, no new order given, will continue to monitor patient.

## 2013-07-12 ENCOUNTER — Inpatient Hospital Stay (HOSPITAL_COMMUNITY): Payer: Medicare Other

## 2013-07-12 ENCOUNTER — Encounter (HOSPITAL_COMMUNITY): Payer: Self-pay | Admitting: *Deleted

## 2013-07-12 DIAGNOSIS — R7989 Other specified abnormal findings of blood chemistry: Secondary | ICD-10-CM | POA: Diagnosis present

## 2013-07-12 DIAGNOSIS — R9430 Abnormal result of cardiovascular function study, unspecified: Secondary | ICD-10-CM | POA: Diagnosis present

## 2013-07-12 DIAGNOSIS — R791 Abnormal coagulation profile: Secondary | ICD-10-CM

## 2013-07-12 LAB — CBC
HEMATOCRIT: 31 % — AB (ref 39.0–52.0)
Hemoglobin: 10 g/dL — ABNORMAL LOW (ref 13.0–17.0)
MCH: 29.5 pg (ref 26.0–34.0)
MCHC: 32.3 g/dL (ref 30.0–36.0)
MCV: 91.4 fL (ref 78.0–100.0)
PLATELETS: 254 10*3/uL (ref 150–400)
RBC: 3.39 MIL/uL — ABNORMAL LOW (ref 4.22–5.81)
RDW: 16.5 % — AB (ref 11.5–15.5)
WBC: 10 10*3/uL (ref 4.0–10.5)

## 2013-07-12 LAB — TYPE AND SCREEN
ABO/RH(D): O POS
Antibody Screen: NEGATIVE
Unit division: 0
Unit division: 0

## 2013-07-12 LAB — BASIC METABOLIC PANEL
BUN: 18 mg/dL (ref 6–23)
CALCIUM: 9.9 mg/dL (ref 8.4–10.5)
CO2: 27 mEq/L (ref 19–32)
CREATININE: 6.66 mg/dL — AB (ref 0.50–1.35)
Chloride: 93 mEq/L — ABNORMAL LOW (ref 96–112)
GFR, EST AFRICAN AMERICAN: 10 mL/min — AB (ref >90)
GFR, EST NON AFRICAN AMERICAN: 9 mL/min — AB (ref >90)
Glucose, Bld: 96 mg/dL (ref 70–99)
Potassium: 3.8 mEq/L (ref 3.7–5.3)
Sodium: 136 mEq/L — ABNORMAL LOW (ref 137–147)

## 2013-07-12 MED ORDER — SODIUM CHLORIDE 0.9 % IV SOLN: 100.0000 mL | INTRAVENOUS | Status: DC | PRN

## 2013-07-12 MED ORDER — IOHEXOL 350 MG/ML SOLN
80.0000 mL | Freq: Once | INTRAVENOUS | Status: AC | PRN
  Administered 2013-07-12: 80 mL via INTRAVENOUS

## 2013-07-12 MED ORDER — HEPARIN SODIUM (PORCINE) 1000 UNIT/ML IJ SOLN
5000.0000 [IU] | Freq: Two times a day (BID) | INTRAMUSCULAR | Status: DC
  Filled 2013-07-12: qty 5

## 2013-07-12 MED ORDER — ASPIRIN 81 MG PO CHEW
81.0000 mg | CHEWABLE_TABLET | Freq: Every day | ORAL | Status: AC
  Administered 2013-07-12: 81 mg via ORAL
  Filled 2013-07-12: qty 1

## 2013-07-12 MED ORDER — ALTEPLASE 2 MG IJ SOLR
2.0000 mg | Freq: Once | INTRAMUSCULAR | Status: DC | PRN
  Filled 2013-07-12: qty 2

## 2013-07-12 MED ORDER — DIAZEPAM 5 MG PO TABS
5.0000 mg | ORAL_TABLET | Freq: Once | ORAL | Status: AC
  Administered 2013-07-13: 5 mg via ORAL
  Filled 2013-07-12: qty 1

## 2013-07-12 MED ORDER — LIDOCAINE HCL (PF) 1 % IJ SOLN: 5.0000 mL | INTRAMUSCULAR | Status: DC | PRN

## 2013-07-12 MED ORDER — HEPARIN SODIUM (PORCINE) 1000 UNIT/ML DIALYSIS
1000.0000 [IU] | INTRAMUSCULAR | Status: DC | PRN
  Filled 2013-07-12: qty 1

## 2013-07-12 MED ORDER — PENTAFLUOROPROP-TETRAFLUOROETH EX AERO: 1.0000 "application " | INHALATION_SPRAY | CUTANEOUS | Status: DC | PRN

## 2013-07-12 MED ORDER — LIDOCAINE-PRILOCAINE 2.5-2.5 % EX CREA: 1.0000 "application " | TOPICAL_CREAM | CUTANEOUS | Status: DC | PRN

## 2013-07-12 MED ORDER — NEPRO/CARBSTEADY PO LIQD: 237.0000 mL | ORAL | Status: DC | PRN

## 2013-07-12 MED ORDER — LEVOFLOXACIN 500 MG PO TABS
500.0000 mg | ORAL_TABLET | ORAL | Status: DC
  Administered 2013-07-12: 500 mg via ORAL
  Filled 2013-07-12 (×2): qty 1

## 2013-07-12 NOTE — Care Management Note (Signed)
CARE MANAGEMENT NOTE 07/12/2013  Patient:  James Wall, James Wall   Account Number:  [id number]  Date Initiated:  07/12/2013  Documentation initiated by:  Duaine Radin  Subjective/Objective Assessment:     Action/Plan:   CM following for progression and d/c planning.   Anticipated DC Date:  07/14/2013   Anticipated DC Plan:  [place]         Choice offered to / List presented to:             Status of service:   Medicare Important Message given?  YES (If response is "NO", the following Medicare IM given date fields will be blank) Date Medicare IM given:  07/12/2013 Date Additional Medicare IM given:    Discharge Disposition:    Per UR Regulation:    If discussed at Long Length of Stay Meetings, dates discussed:    Comments:

## 2013-07-12 NOTE — Progress Notes (Signed)
Subjective:  No chest pain this am.  Objective:  Vital Signs in the last 24 hours: Temp:  [98.1 F (36.7 C)-98.9 F (37.2 C)] 98.5 F (36.9 C) (06/09 0500) Pulse Rate:  [67-94] 73 (06/09 0500) Resp:  [15-36] 18 (06/09 0500) BP: (126-163)/(61-89) 129/64 mmHg (06/09 0500) SpO2:  [93 %-97 %] 95 % (06/09 0500) Weight:  [69.7 kg (153 lb 10.6 oz)] 69.7 kg (153 lb 10.6 oz) (06/08 1332)  Intake/Output from previous day:  Intake/Output Summary (Last 24 hours) at 07/12/13 0939 Last data filed at 07/12/13 0915  Gross per 24 hour  Intake   1450 ml  Output   2896 ml  Net  -1446 ml    Physical Exam: General appearance: alert, cooperative and no distress Lungs: clear to auscultation bilaterally Heart: regular rate and rhythm ABD: BS+ No significant edema   Rate: 78  Rhythm: normal sinus rhythm  Lab Results:  Recent Labs  07/11/13 0800 07/12/13 0455  WBC 10.8* 10.0  HGB 8.4* 10.0*  PLT 320 254   CBC Latest Ref Rng 07/12/2013 07/11/2013 07/10/2013  WBC 4.0 - 10.5 K/uL 10.0 10.8(H) 10.2  Hemoglobin 13.0 - 17.0 g/dL 10.0(L) 8.4(L) 7.4(L)  Hematocrit 39.0 - 52.0 % 31.0(L) 26.9(L) 23.7(L)  Platelets 150 - 400 K/uL 254 320 240     Recent Labs  07/11/13 0800 07/12/13 0455  NA 139 136*  K 4.1 3.8  CL 94* 93*  CO2 28 27  GLUCOSE 122* 96  BUN 28* 18  CREATININE 9.93* 6.66*   No results found for this basename: TROPONINI, CK, MB,  in the last 72 hours No results found for this basename: INR,  in the last 72 hours  Imaging: Imaging results have been reviewed  Cardiac Studies:  Assessment/Plan:  48 year old AA male with PMH for HTN, ESRD on HD MWF, COPD and history of CAD. The patient also has a diagnosis of liver cirrhosis of unknown etiology. His last echo on 12/23/2012 showed preserved EF of 60-65%. He was seen by Dr. Meda Coffee in the clinic on 05/31/2013 complaining of fatigue, shortness of breath with minimal exertion, and weight loss. He also experienced palpitations  associated with shortness of breath at the end of his dialysis for which he take PRN metoprolol. EKG was abnormal for T wave inversion in lateral leads. Given his symptoms, a 2 day lexiscan stress test was obtained on 06/16/2013 which showed mild decreased uptake of moderate-sized along the anterior wall distribution, mid to distal anterior wall ischemia. Patient was originally scheduled to undergo outpatient cardiac catheterization on 06/28/2013 however after extensive discussion of regarding we prefer to go from the femoral approach given his h/o bilateral UE AVF, patient declined to undergo the cardiac cath without full anesthesia because his past bad experience of experiencing pain despite moderate sedation. The procedure was cancelled. He was admitted 07/08/13 through the ER with chest pain, anemia, and hypoxia. Plan was for cath with sedation once Hgb was stabilized.    Principal Problem:   Acute respiratory failure with hypoxia Active Problems:   End stage renal disease   Anemia of renal disease   Elevated d-dimer   Abnormal cardiovascular function study   GERD (gastroesophageal reflux disease)   Abnormal stress test   Chest pain at rest   HCAP (healthcare-associated pneumonia)    PLAN: MD to see. The pt had a D-dimer on admission of 5.9 ordered by ER MD. This needs to be followed up. Discussed with M.Bergman PA, will check CTA  today to r/o PE. Will track down GI records. Plan cath ? In AM.   Kerin Ransom PA-C [facility] [phone] 07/12/2013, 9:39 AM   Patient seen and examined. Agree with assessment and plan. CT negative for PE. For cath tomorrow prior to dialysis.   Troy Sine, MD, [facility] 07/12/2013 1:11 PM

## 2013-07-12 NOTE — Progress Notes (Signed)
S:  No CP.  Received transfusion yest O:BP 129/64  Pulse 73  Temp(Src) 98.5 F (36.9 C) (Oral)  Resp 18  Ht 5' 6"  (1.676 m)  Wt 69.7 kg (153 lb 10.6 oz)  BMI 24.81 kg/m2  SpO2 95%  Intake/Output Summary (Last 24 hours) at 07/12/13 0859 Last data filed at 07/11/13 2257  Gross per 24 hour  Intake   1090 ml  Output   2896 ml  Net  -1806 ml   Weight change:  SFK:CLEXN and alert CVS:RRR Resp:clear Abd:+ BS NTND Ext:no edema  RUA AVF + bruit NEURO:CNI Ox3 No asterixis. Vanetta Shawl cath   . amLODipine  10 mg Oral QHS  . atorvastatin  20 mg Oral Daily  . cloNIDine  0.3 mg Oral BID  . [START ON 07/13/2013] darbepoetin (ARANESP) injection - DIALYSIS  100 mcg Intravenous Q Wed-HD  . doxercalciferol  5 mcg Intravenous Q M,W,F-HD  . [START ON 07/13/2013] ferric gluconate (FERRLECIT/NULECIT) IV  62.5 mg Intravenous Q Wed-HD  . gabapentin  100 mg Oral TID  . heparin subcutaneous  5,000 Units Subcutaneous Q12H  . hydrALAZINE  50 mg Oral 3 times per day  . isosorbide mononitrate  30 mg Oral Daily  . lisinopril  40 mg Oral Daily  . metoprolol succinate  25 mg Oral Daily  . multivitamin  1 tablet Oral QHS  . piperacillin-tazobactam (ZOSYN)  IV  2.25 g Intravenous 3 times per day  . sevelamer carbonate  2,400-5,600 mg Oral TID WC  . vancomycin  750 mg Intravenous Q M,W,F-HD   No results found. BMET    Component Value Date/Time   NA 136* 07/12/2013 0455   K 3.8 07/12/2013 0455   CL 93* 07/12/2013 0455   CO2 27 07/12/2013 0455   GLUCOSE 96 07/12/2013 0455   BUN 18 07/12/2013 0455   CREATININE 6.66* 07/12/2013 0455   CALCIUM 9.9 07/12/2013 0455   CALCIUM 7.7* 02/18/2007 1522   GFRNONAA 9* 07/12/2013 0455   GFRAA 10* 07/12/2013 0455   CBC    Component Value Date/Time   WBC 10.0 07/12/2013 0455   RBC 3.39* 07/12/2013 0455   RBC 2.60* 07/10/2013 1500   HGB 10.0* 07/12/2013 0455   HCT 31.0* 07/12/2013 0455   PLT 254 07/12/2013 0455   MCV 91.4 07/12/2013 0455   MCH 29.5 07/12/2013 0455   MCHC 32.3 07/12/2013 0455   RDW 16.5* 07/12/2013 0455   LYMPHSABS 1.0 07/09/2013 0300   MONOABS 0.7 07/09/2013 0300   EOSABS 0.4 07/09/2013 0300   BASOSABS 0.0 07/09/2013 0300     Assessment: 1. PNA 2. CP with + stress test 3. HTN 4. Anemia on aranesp 5. Sec HPTH on hectorol 6. ESRD   Plan: 1. HD tomorrow 2. Ht cath at some point  Windy Kalata

## 2013-07-12 NOTE — Progress Notes (Signed)
[facility] Progress Note  Assessment/Plan: 1. SOB/hypoxemia ? PNA, vol excess; left 0.5 below edw last tmt prior to admission; empiric Vanc and Zosyn 2. ESRD - MWF 3. Anemia -  hgb up to 10 after transfusion, low Hgb may have been contributory to sx; tsat 35% ferritin 1060; low dose weekly Fe; Aranesp 100/week 4. Secondary hyperparathyroidism - hectorol 5 + renvela 5. HTN/volume - BP improved over all; UF 2.9 Monday - with post weight 69.7 - significantly below EDW - titrate further need to get standing weights 6. Nutrition - renal diet 7. + stress test-  Had significant anxiety about this; favor getting done while here if possible 8. Anxiety/depression - only treated with xanax; think he would benefit by a long acting SSRI with prn xanax 9. Hx cirrhosis, ascites, unclear etiology 10. Chronic GI pain - sees GI  Myriam Jacobson, PA-C [facility] [phone] 07/12/2013,7:55 AM  LOS: 4 days   Subjective:   Pain in right shoulder, has some weakness in right arm/hand - cannot life heavy thngs; If heart cath not today, wants to know if he can go home and have it done next week  Objective Filed Vitals:   07/11/13 1409 07/11/13 2100 07/12/13 0106 07/12/13 0500  BP: 153/73 159/89 126/61 129/64  Pulse: 93 93 67 73  Temp:  98.9 F (37.2 C)  98.5 F (36.9 C)  TempSrc:  Oral  Oral  Resp: 18 20  18   Height:      Weight:      SpO2: 94% 95% 97% 95%   Physical Exam General: composed, NAD Heart:RRR Lungs: coarse BS bases bilaterally Abdomen: some distension ? ascites Extremities: no LE edema Dialysis Access:  Right upper AVF + bruit  Dialysis Orders: MWF [facility] 4h 73.5kg RUA AVF 450/A1.5 Heparin 1800 Aranesp 100 ug q Wed Venofer 50/wed Hect 5 mcg  pth 115, TSat 33%    Additional Objective Labs: Basic Metabolic Panel:  Recent Labs Lab 07/09/13 0300 07/10/13 0240 07/11/13 0800 07/12/13 0455  NA 137 138 139 136*  K 4.1 4.3 4.1 3.8  CL 94* 97  94* 93*  CO2 28 28 28 27   GLUCOSE 93 104* 122* 96  BUN 29* 17 28* 18  CREATININE 9.83* 7.22* 9.93* 6.66*  CALCIUM 9.5 9.9 10.4 9.9  PHOS 4.7*  --  4.9*  --    Liver Function Tests:  Recent Labs Lab 07/08/13 1336 07/09/13 0300 07/11/13 0800  AST 13 9  --   ALT 5 <5  --   ALKPHOS 83 61  --   BILITOT 1.2 0.8  --   PROT 8.6* 7.0  --   ALBUMIN 3.2* 2.6* 2.5*   CBC:  Recent Labs Lab 07/08/13 1336 07/09/13 0300  07/10/13 0240 07/11/13 0800 07/12/13 0455  WBC 9.5 7.8  --  10.2 10.8* 10.0  NEUTROABS 7.5 5.7  --   --   --   --   HGB 9.8* 7.8*  < > 7.4* 8.4* 10.0*  HCT 30.6* 25.0*  < > 23.7* 26.9* 31.0*  MCV 93.0 95.1  --  95.2 94.4 91.4  PLT 276 213  < > 240 320 254  < > = values in this interval not displayed. Blood Culture    Component Value Date/Time   SDES URINE, RANDOM 07/09/2013 0902   SPECREQUEST NONE 07/09/2013 0902   CULT  Value: STAPHYLOCOCCUS SPECIES (COAGULASE NEGATIVE) Note: THE SIGNIFICANCE OF ISOLATING THIS ORGANISM FROM A SINGLE SET OF BLOOD CULTURES  WHEN MULTIPLE SETS ARE DRAWN IS UNCERTAIN. PLEASE NOTIFY THE MICROBIOLOGY DEPARTMENT WITHIN ONE WEEK IF SPECIATION AND SENSITIVITIES ARE REQUIRED. Note: Gram Stain Report Called to,Read Back By and Verified With: ERIN SMITH 07/09/13 @ 5:53PM BY RUSCOE A. Performed at [facility] 07/08/2013 1810   REPTSTATUS 07/10/2013 FINAL 07/09/2013 0902    Cardiac Enzymes:  Recent Labs Lab 07/09/13 0300 07/09/13 0655  TROPONINI <0.30 <0.30   CIron Studies:  Recent Labs  07/10/13 1500  IRON 58  TIBC 164*  FERRITIN 1060*  Medications: . sodium chloride 10 mL/hr at 07/09/13 2200  . nitroGLYCERIN Stopped (07/10/13 1800)   . amLODipine  10 mg Oral QHS  . atorvastatin  20 mg Oral Daily  . cloNIDine  0.3 mg Oral BID  . [START ON 07/13/2013] darbepoetin (ARANESP) injection - DIALYSIS  100 mcg Intravenous Q Wed-HD  . doxercalciferol  5 mcg Intravenous Q M,W,F-HD  . [START ON 07/13/2013] ferric gluconate  (FERRLECIT/NULECIT) IV  62.5 mg Intravenous Q Wed-HD  . gabapentin  100 mg Oral TID  . heparin subcutaneous  5,000 Units Subcutaneous Q12H  . hydrALAZINE  50 mg Oral 3 times per day  . isosorbide mononitrate  30 mg Oral Daily  . lisinopril  40 mg Oral Daily  . metoprolol succinate  25 mg Oral Daily  . multivitamin  1 tablet Oral QHS  . piperacillin-tazobactam (ZOSYN)  IV  2.25 g Intravenous 3 times per day  . sevelamer carbonate  2,400-5,600 mg Oral TID WC  . vancomycin  750 mg Intravenous Q M,W,F-HD  I have seen and examined this patient and agree with plan per Amalia Hailey.  See my note.Alphonse Guild Ilean Spradlin,MD 07/12/2013 9:07 AM

## 2013-07-12 NOTE — Progress Notes (Signed)
Pt scheduled for coronary angiogram tomorrow 6/10. Consent signed. Unable to view PCI video d/t technical issues. Discussed procedure with pt. Pt verbalized understanding. Manya Silvas, RN

## 2013-07-12 NOTE — Progress Notes (Addendum)
[facility] PROGRESS NOTE  [facility] FAO:[id number] DOB: February 26, 1965 DOA: 07/08/2013 PCP: James Meckel, MD Brief NArrative James Wall is a 48 y.o. male with Past medical history of ESRD on HD, noncompliant often signs off early from HD, hypertension, COPD, GERD, anemia of chronic disease, abnormal stress test patient refused PCI last week since he would not undergo cath unless under general anesthesia. Patient presented with complaints of chest pain and fever, productive cough on and off since last 2 weeks. The chest pain is located on the right side and he is a sharp pain which gets worse when he takes a deep breath and when he has a cough. FOund to have HCAP and Hypertensive Urgency, with volume overload Was initially on Nitro gtt in SDu, now off this. BP stable now on Po meds Dialyzed extra per Renal Also being followed by Cardiology and cath planned after much discussion with patient.  Assessment/Plan: 1. HCAP -continue Vanc/Zosyn Day4, change to PO levaquin today -1/2 blood Bx -coagulase negative staph c/w contaminant -clinically improving -D-dimer elevated on admission, but symptoms explained by CXR findings, do not suspect PE, CTA ordered by consultants  2. ESRD on HD with volume overload -non compliant and signs out before completing HD and Dietary non compliance too -Renal following -HD per renal  3. Hypertensive urgency -improved -off Nitro gtt -continue  clonidine PO, amlodipine, Hydralazine, lisinopril  4. CAD with abnormal myoview -declined cath 6/1 since wanted this under general anesthesia -continue ASA/metoprolol/statin -Cards following, plan for cath this admission possibly tomorrow  5. Anxiety -xanax PRN  6. Anemia of chronic disease -continue Aranesp, anemia panel c/w chronic disease -given 2 units in HD 6/8 in HD --His baseline Hb is in 8 range from 11/14, last week fluctuated in 9-10range which is higher than his  baseline -h/o EGD and colonoscopy last year per [facility]-2 polyps removed otherwise benign per pt report, requested records, still pending  DVT proph: continue heparin  Code Status:Full Code Family Communication:none at bedside Disposition Plan: home pending workup   Consultants:  Renal  HPI/Subjective: Feel better, no chest pain or dyspnea  Objective: Filed Vitals:   07/12/13 0958  BP: 151/76  Pulse: 75  Temp: 97.4 F (36.3 C)  Resp: 18    Intake/Output Summary (Last 24 hours) at 07/12/13 1120 Last data filed at 07/12/13 0915  Gross per 24 hour  Intake   1450 ml  Output   2896 ml  Net  -1446 ml   Filed Weights   07/11/13 0800 07/11/13 0917 07/11/13 1332  Weight: 73.1 kg (161 lb 2.5 oz) 72.8 kg (160 lb 7.9 oz) 69.7 kg (153 lb 10.6 oz)    Exam:   General:  AAOx3,   Cardiovascular: S1S2/RRR  Respiratory: basilar ronchi improved  Abdomen: soft, Nt, BS present  Musculoskeletal:no edema c/c  Data Reviewed: Basic Metabolic Panel:  Recent Labs Lab 07/08/13 1336 07/09/13 0300 07/10/13 0240 07/11/13 0800 07/12/13 0455  NA 137 137 138 139 136*  K 4.2 4.1 4.3 4.1 3.8  CL 90* 94* 97 94* 93*  CO2 27 28 28 28 27   GLUCOSE 101* 93 104* 122* 96  BUN 24* 29* 17 28* 18  CREATININE 8.17* 9.83* 7.22* 9.93* 6.66*  CALCIUM 9.8 9.5 9.9 10.4 9.9  MG  --  2.0  --   --   --   PHOS  --  4.7*  --  4.9*  --    Liver Function Tests:  Recent Labs Lab 07/08/13 1336  07/09/13 0300 07/11/13 0800  AST 13 9  --   ALT 5 <5  --   ALKPHOS 83 61  --   BILITOT 1.2 0.8  --   PROT 8.6* 7.0  --   ALBUMIN 3.2* 2.6* 2.5*   No results found for this basename: LIPASE, AMYLASE,  in the last 168 hours No results found for this basename: AMMONIA,  in the last 168 hours CBC:  Recent Labs Lab 07/08/13 1336 07/09/13 0300 07/09/13 0655 07/10/13 0240 07/11/13 0800 07/12/13 0455  WBC 9.5 7.8  --  10.2 10.8* 10.0  NEUTROABS 7.5 5.7  --   --   --   --   HGB 9.8* 7.8* 7.2*  7.4* 8.4* 10.0*  HCT 30.6* 25.0* 22.7* 23.7* 26.9* 31.0*  MCV 93.0 95.1  --  95.2 94.4 91.4  PLT 276 213 215 240 320 254   Cardiac Enzymes:  Recent Labs Lab 07/09/13 0300 07/09/13 0655  TROPONINI <0.30 <0.30   BNP (last 3 results)  Recent Labs  07/09/13 0300  PROBNP >70000.0*   CBG: No results found for this basename: GLUCAP,  in the last 168 hours  Recent Results (from the past 240 hour(s))  CULTURE, BLOOD (ROUTINE X 2)     Status: None   Collection Time    07/08/13  6:00 PM      Result Value Ref Range Status   Specimen Description BLOOD LEFT ARM   Final   Special Requests BOTTLES DRAWN AEROBIC AND ANAEROBIC 10CC   Final   Culture  Setup Time     Final   Value: 07/08/2013 22:39     Performed at [facility]   Culture     Final   Value:        BLOOD CULTURE RECEIVED NO GROWTH TO DATE CULTURE WILL BE HELD FOR 5 DAYS BEFORE ISSUING A FINAL NEGATIVE REPORT     Performed at [facility]   Report Status PENDING   Incomplete  CULTURE, BLOOD (ROUTINE X 2)     Status: None   Collection Time    07/08/13  6:10 PM      Result Value Ref Range Status   Specimen Description BLOOD LEFT HAND   Final   Special Requests BOTTLES DRAWN AEROBIC ONLY [facility]   Final   Culture  Setup Time     Final   Value: 07/08/2013 22:38     Performed at [facility]   Culture     Final   Value: STAPHYLOCOCCUS SPECIES (COAGULASE NEGATIVE)     Note: THE SIGNIFICANCE OF ISOLATING THIS ORGANISM FROM A SINGLE SET OF BLOOD CULTURES WHEN MULTIPLE SETS ARE DRAWN IS UNCERTAIN. PLEASE NOTIFY THE MICROBIOLOGY DEPARTMENT WITHIN ONE WEEK IF SPECIATION AND SENSITIVITIES ARE REQUIRED.     Note: Gram Stain Report Called to,Read Back By and Verified With: James Wall 07/09/13 @ 5:53PM BY James A.     Performed at [facility]   Report Status 07/10/2013 FINAL   Final  MRSA PCR SCREENING     Status: None   Collection Time    07/09/13  1:58 AM      Result Value Ref Range Status   MRSA by  PCR NEGATIVE  NEGATIVE Final   Comment:            The GeneXpert MRSA Assay (FDA     approved for NASAL specimens     only), is one component of a     comprehensive  MRSA colonization     surveillance program. It is not     intended to diagnose MRSA     infection nor to guide or     monitor treatment for     MRSA infections.     Studies: Ct Angio Chest Pe W/cm &/or Wo Cm  07/12/2013   CLINICAL DATA:  ELEVATED d-DIMER ; chest pain with history of COPD and coronary artery disease; end-stage renal disease on hemodialysis; cirrhosis  EXAM: CT ANGIOGRAPHY CHEST WITH CONTRAST  TECHNIQUE: Multidetector CT imaging of the chest was performed using the standard protocol during bolus administration of intravenous contrast. Multiplanar CT image reconstructions and MIPs were obtained to evaluate the vascular anatomy.  CONTRAST:  29m OMNIPAQUE IOHEXOL 350 MG/ML SOLN  COMPARISON:  Portable chest x-ray of today's date.  FINDINGS: Contrast within the pulmonary arterial tree is normal. There are no filling defects to suggest an acute pulmonary embolism. The caliber of the thoracic aorta is normal. There is no false lumen. The cardiac chambers are enlarged. There is no pericardial effusion. There are a few borderline enlarged mediastinal lymph nodes but no bulky lymphadenopathy. The thyroid gland and thoracic esophagus are normal.  At lung window settings there are increased interstitial densities present in the dependent portion of both lungs. There are bullous lesions bilaterally. There are no air bronchograms. There are tiny pericardial effusions bilaterally.  Within the upper abdomen. A large amount of ascites is present. The stomach is moderately distended with food.  The thoracic spine and sternum and visualized portions of the ribs exhibit no acute abnormalities.  Review of the MIP images confirms the above findings.  IMPRESSION: 1. There is no acute pulmonary embolism nor acute thoracic aortic pathology. 2. There  is enlargement of the cardiac chambers. There is increased interstitial density in the dependent portion of both lungs most compatible with mild interstitial edema. There is no alveolar pneumonia. There are underlying changes of COPD. 3. There is no pericardial effusion. There are small bilateral pleural effusions. There is a large amount of ascites.   Electronically Signed   By: David  JMartinique  On: 07/12/2013 11:08    Scheduled Meds: . amLODipine  10 mg Oral QHS  . atorvastatin  20 mg Oral Daily  . cloNIDine  0.3 mg Oral BID  . [START ON 07/13/2013] darbepoetin (ARANESP) injection - DIALYSIS  100 mcg Intravenous Q Wed-HD  . doxercalciferol  5 mcg Intravenous Q M,W,F-HD  . [START ON 07/13/2013] ferric gluconate (FERRLECIT/NULECIT) IV  62.5 mg Intravenous Q Wed-HD  . gabapentin  100 mg Oral TID  . heparin subcutaneous  5,000 Units Subcutaneous Q12H  . hydrALAZINE  50 mg Oral 3 times per day  . isosorbide mononitrate  30 mg Oral Daily  . lisinopril  40 mg Oral Daily  . metoprolol succinate  25 mg Oral Daily  . multivitamin  1 tablet Oral QHS  . piperacillin-tazobactam (ZOSYN)  IV  2.25 g Intravenous 3 times per day  . sevelamer carbonate  2,400-5,600 mg Oral TID WC  . vancomycin  750 mg Intravenous Q M,W,F-HD   Continuous Infusions: . sodium chloride 10 mL/hr at 07/09/13 2200  . nitroGLYCERIN Stopped (07/10/13 1800)   Antibiotics Given (last 72 hours)   Date/Time Action Medication Dose Rate   07/09/13 2341 Given   piperacillin-tazobactam (ZOSYN) IVPB 2.25 g 2.25 g 100 mL/hr   07/10/13 0659 Given   piperacillin-tazobactam (ZOSYN) IVPB 2.25 g 2.25 g 100 mL/hr   07/10/13 1439 Given  piperacillin-tazobactam (ZOSYN) IVPB 2.25 g 2.25 g 100 mL/hr   07/10/13 2252 Given   piperacillin-tazobactam (ZOSYN) IVPB 2.25 g 2.25 g 100 mL/hr   07/11/13 0656 Given   piperacillin-tazobactam (ZOSYN) IVPB 2.25 g 2.25 g 100 mL/hr   07/11/13 1250 Given  [given in hemodialysis]   vancomycin (VANCOCIN) IVPB  750 mg/150 ml premix 750 mg 150 mL/hr   07/11/13 1547 Given   piperacillin-tazobactam (ZOSYN) IVPB 2.25 g 2.25 g 100 mL/hr   07/11/13 2257 Given   piperacillin-tazobactam (ZOSYN) IVPB 2.25 g 2.25 g 100 mL/hr      Principal Problem:   Acute respiratory failure with hypoxia Active Problems:   End stage renal disease   GERD (gastroesophageal reflux disease)   Anemia of renal disease   Abnormal stress test   Chest pain at rest   HCAP (healthcare-associated pneumonia)   Elevated d-dimer   Abnormal cardiovascular function study     Time spent: 29mn    Markell Schrier  [facility] Pager [phone] If 7PM-7AM, please contact night-coverage at [email or web address], password [facility]6/10/2013, 11:20 AM  LOS: 4 days

## 2013-07-12 NOTE — Progress Notes (Signed)
Received tele alerts that patient has been running between sinus rhythm, sinus brady (in the 30s) and occasionally 2nd degree heart block. Patient asymptomatic, blood pressure equals 126/61, heart rate= 67. Notified NP on call, no orders received. Will continue to monitor.

## 2013-07-13 ENCOUNTER — Encounter (HOSPITAL_COMMUNITY)
Admission: EM | Disposition: A | Discharge status: Home / Self Care | Payer: Self-pay | Source: Home / Self Care | Attending: Internal Medicine

## 2013-07-13 DIAGNOSIS — N186 End stage renal disease: Secondary | ICD-10-CM | POA: Diagnosis not present

## 2013-07-13 DIAGNOSIS — D509 Iron deficiency anemia, unspecified: Secondary | ICD-10-CM | POA: Diagnosis not present

## 2013-07-13 DIAGNOSIS — D631 Anemia in chronic kidney disease: Secondary | ICD-10-CM | POA: Diagnosis not present

## 2013-07-13 DIAGNOSIS — N039 Chronic nephritic syndrome with unspecified morphologic changes: Secondary | ICD-10-CM | POA: Diagnosis not present

## 2013-07-13 DIAGNOSIS — N2581 Secondary hyperparathyroidism of renal origin: Secondary | ICD-10-CM | POA: Diagnosis not present

## 2013-07-13 DIAGNOSIS — J9600 Acute respiratory failure, unspecified whether with hypoxia or hypercapnia: Secondary | ICD-10-CM

## 2013-07-13 DIAGNOSIS — Z992 Dependence on renal dialysis: Secondary | ICD-10-CM | POA: Diagnosis not present

## 2013-07-13 DIAGNOSIS — E119 Type 2 diabetes mellitus without complications: Secondary | ICD-10-CM | POA: Diagnosis not present

## 2013-07-13 DIAGNOSIS — R9430 Abnormal result of cardiovascular function study, unspecified: Secondary | ICD-10-CM

## 2013-07-13 HISTORY — PX: LEFT HEART CATHETERIZATION WITH CORONARY ANGIOGRAM: SHX5451

## 2013-07-13 LAB — BASIC METABOLIC PANEL
BUN: 32 mg/dL — AB (ref 6–23)
CHLORIDE: 93 meq/L — AB (ref 96–112)
CO2: 25 meq/L (ref 19–32)
CREATININE: 8.99 mg/dL — AB (ref 0.50–1.35)
Calcium: 10.6 mg/dL — ABNORMAL HIGH (ref 8.4–10.5)
GFR calc Af Amer: 7 mL/min — ABNORMAL LOW (ref >90)
GFR calc non Af Amer: 6 mL/min — ABNORMAL LOW (ref >90)
Glucose, Bld: 80 mg/dL (ref 70–99)
Potassium: 4.2 mEq/L (ref 3.7–5.3)
Sodium: 136 mEq/L — ABNORMAL LOW (ref 137–147)

## 2013-07-13 LAB — CBC
HCT: 30.4 % — ABNORMAL LOW (ref 39.0–52.0)
Hemoglobin: 9.8 g/dL — ABNORMAL LOW (ref 13.0–17.0)
MCH: 29.3 pg (ref 26.0–34.0)
MCHC: 32.2 g/dL (ref 30.0–36.0)
MCV: 91 fL (ref 78.0–100.0)
Platelets: 302 10*3/uL (ref 150–400)
RBC: 3.34 MIL/uL — AB (ref 4.22–5.81)
RDW: 15.9 % — ABNORMAL HIGH (ref 11.5–15.5)
WBC: 11 10*3/uL — ABNORMAL HIGH (ref 4.0–10.5)

## 2013-07-13 SURGERY — LEFT HEART CATHETERIZATION WITH CORONARY ANGIOGRAM
Anesthesia: LOCAL

## 2013-07-13 MED ORDER — SODIUM CHLORIDE 0.9 % IV SOLN: 100.0000 mL | INTRAVENOUS | Status: DC | PRN

## 2013-07-13 MED ORDER — SODIUM CHLORIDE 0.9 % IJ SOLN: 3.0000 mL | INTRAMUSCULAR | Status: DC | PRN

## 2013-07-13 MED ORDER — FENTANYL CITRATE 0.05 MG/ML IJ SOLN
INTRAMUSCULAR | Status: AC
  Filled 2013-07-13: qty 2

## 2013-07-13 MED ORDER — LIDOCAINE HCL (PF) 1 % IJ SOLN: 5.0000 mL | INTRAMUSCULAR | Status: DC | PRN

## 2013-07-13 MED ORDER — MIDAZOLAM HCL 2 MG/2ML IJ SOLN
INTRAMUSCULAR | Status: AC
  Filled 2013-07-13: qty 2

## 2013-07-13 MED ORDER — SODIUM CHLORIDE 0.9 % IV SOLN: 250.0000 mL | INTRAVENOUS | Status: DC | PRN

## 2013-07-13 MED ORDER — LIDOCAINE HCL (PF) 1 % IJ SOLN
INTRAMUSCULAR | Status: AC
  Filled 2013-07-13: qty 30

## 2013-07-13 MED ORDER — HYDRALAZINE HCL 20 MG/ML IJ SOLN
INTRAMUSCULAR | Status: AC
  Filled 2013-07-13: qty 1

## 2013-07-13 MED ORDER — HEPARIN SODIUM (PORCINE) 1000 UNIT/ML DIALYSIS: 1000.0000 [IU] | INTRAMUSCULAR | Status: DC | PRN

## 2013-07-13 MED ORDER — NEPRO/CARBSTEADY PO LIQD: 237.0000 mL | ORAL | Status: DC | PRN

## 2013-07-13 MED ORDER — LIDOCAINE-PRILOCAINE 2.5-2.5 % EX CREA: 1.0000 "application " | TOPICAL_CREAM | CUTANEOUS | Status: DC | PRN

## 2013-07-13 MED ORDER — HEPARIN SODIUM (PORCINE) 5000 UNIT/ML IJ SOLN
5000.0000 [IU] | Freq: Two times a day (BID) | INTRAMUSCULAR | Status: DC
  Filled 2013-07-13 (×3): qty 1

## 2013-07-13 MED ORDER — ALTEPLASE 2 MG IJ SOLR: 2.0000 mg | Freq: Once | INTRAMUSCULAR | Status: DC | PRN

## 2013-07-13 MED ORDER — SODIUM CHLORIDE 0.9 % IJ SOLN
3.0000 mL | Freq: Two times a day (BID) | INTRAMUSCULAR | Status: DC
  Administered 2013-07-13 – 2013-07-14 (×2): 3 mL via INTRAVENOUS

## 2013-07-13 MED ORDER — PENTAFLUOROPROP-TETRAFLUOROETH EX AERO: 1.0000 "application " | INHALATION_SPRAY | CUTANEOUS | Status: DC | PRN

## 2013-07-13 MED ORDER — DOXERCALCIFEROL 4 MCG/2ML IV SOLN
INTRAVENOUS | Status: AC
  Filled 2013-07-13: qty 4

## 2013-07-13 MED ORDER — NITROGLYCERIN 0.2 MG/ML ON CALL CATH LAB
INTRAVENOUS | Status: AC
  Filled 2013-07-13: qty 1

## 2013-07-13 MED ORDER — HEPARIN (PORCINE) IN NACL 2-0.9 UNIT/ML-% IJ SOLN
INTRAMUSCULAR | Status: AC
  Filled 2013-07-13: qty 1000

## 2013-07-13 MED ORDER — DARBEPOETIN ALFA-POLYSORBATE 100 MCG/0.5ML IJ SOLN
INTRAMUSCULAR | Status: AC
  Filled 2013-07-13: qty 0.5

## 2013-07-13 MED ORDER — HEPARIN SODIUM (PORCINE) 1000 UNIT/ML DIALYSIS: 20.0000 [IU]/kg | INTRAMUSCULAR | Status: DC | PRN

## 2013-07-13 MED ORDER — HYDRALAZINE HCL 20 MG/ML IJ SOLN: 10.0000 mg | INTRAMUSCULAR | Status: DC | PRN

## 2013-07-13 MED ORDER — METOPROLOL TARTRATE 1 MG/ML IV SOLN
INTRAVENOUS | Status: AC
  Filled 2013-07-13: qty 5

## 2013-07-13 NOTE — Progress Notes (Signed)
Patient Name: James Wall [facility] Date of Encounter: 07/13/2013  Principal Problem:   Acute respiratory failure with hypoxia Active Problems:   End stage renal disease   GERD (gastroesophageal reflux disease)   Anemia of renal disease   Abnormal stress test   Chest pain at rest   HCAP (healthcare-associated pneumonia)   Elevated d-dimer   Abnormal cardiovascular function study    Patient Profile: 48 yo male w/ no documented CAD but with recent abnl stress test, with PMH of ESRD on dialysis, anemia, cirrhosis, HTN, and COPD was admitted 06/05 with chest pain and fever. Acute resp failure w/ hypoxia, HTN and worsening anemia. Cards following, pt has agreed to cath, not performed yet due to resp status and anemia.    SUBJECTIVE: Developed abdominal pain during HD, has been having recently, limited the amount of fluid they could pull. No chest pain, feels SOB is OK.   OBJECTIVE Filed Vitals:   07/13/13 1000 07/13/13 1030 07/13/13 1100 07/13/13 1125  BP: 153/91 156/87 169/94 147/84  Pulse: 80 79 83 93  Temp:    98.4 F (36.9 C)  TempSrc:    Oral  Resp:    20  Height:      Weight:    155 lb 6.8 oz (70.5 kg)  SpO2:    95%    Intake/Output Summary (Last 24 hours) at 07/13/13 1153 Last data filed at 07/13/13 1125  Gross per 24 hour  Intake    120 ml  Output   1407 ml  Net  -1287 ml   Filed Weights   07/12/13 2100 07/13/13 0712 07/13/13 1125  Weight: 150 lb 6.4 oz (68.221 kg) 158 lb 1.1 oz (71.7 kg) 155 lb 6.8 oz (70.5 kg)    PHYSICAL EXAM General: Well developed, well nourished, male in no acute distress. Head: Normocephalic, atraumatic.  Neck: Supple without bruits, JVD at 8 cm. Lungs:  Resp regular and unlabored, dry rales, good air exchange. Heart: RRR, S1, S2, no S3, S4, systolic murmur; no rub. Abdomen: Soft, non-tender, non-distended, BS + x 4.  Extremities: No clubbing, cyanosis, no edema.  Neuro: Alert and oriented X 3. Moves all extremities  spontaneously. Psych: Normal affect.  LABS: CBC: Recent Labs  07/12/13 0455 07/13/13 0500  WBC 10.0 11.0*  HGB 10.0* 9.8*  HCT 31.0* 30.4*  MCV 91.4 91.0  PLT 254 371   Basic Metabolic Panel: Recent Labs  07/11/13 0800 07/12/13 0455 07/13/13 0500  NA 139 136* 136*  K 4.1 3.8 4.2  CL 94* 93* 93*  CO2 28 27 25   GLUCOSE 122* 96 80  BUN 28* 18 32*  CREATININE 9.93* 6.66* 8.99*  CALCIUM 10.4 9.9 10.6*  PHOS 4.9*  --   --    Liver Function Tests: Recent Labs  07/11/13 0800  ALBUMIN 2.5*   Lab Results  Component Value Date   TROPONINI <0.30 07/09/2013   BNP: Pro B Natriuretic peptide (BNP)  Date/Time Value Ref Range Status  07/09/2013  3:00 AM >70000.0* 0 - 125 pg/mL Final   Anemia Panel: Recent Labs  07/10/13 1500  VITAMINB12 541  FOLATE 3.8  FERRITIN 1060*  TIBC 164*  IRON 58  RETICCTPCT 4.5*    TELE:   SR (Seen in HD)    Radiology/Studies: Ct Angio Chest Pe W/cm &/or Wo Cm 07/12/2013   CLINICAL DATA:  ELEVATED d-DIMER ; chest pain with history of COPD and coronary artery disease; end-stage renal disease on hemodialysis; cirrhosis  EXAM:  CT ANGIOGRAPHY CHEST WITH CONTRAST  TECHNIQUE: Multidetector CT imaging of the chest was performed using the standard protocol during bolus administration of intravenous contrast. Multiplanar CT image reconstructions and MIPs were obtained to evaluate the vascular anatomy.  CONTRAST:  82m OMNIPAQUE IOHEXOL 350 MG/ML SOLN  COMPARISON:  Portable chest x-ray of today's date.  FINDINGS: Contrast within the pulmonary arterial tree is normal. There are no filling defects to suggest an acute pulmonary embolism. The caliber of the thoracic aorta is normal. There is no false lumen. The cardiac chambers are enlarged. There is no pericardial effusion. There are a few borderline enlarged mediastinal lymph nodes but no bulky lymphadenopathy. The thyroid gland and thoracic esophagus are normal.  At lung window settings there are increased  interstitial densities present in the dependent portion of both lungs. There are bullous lesions bilaterally. There are no air bronchograms. There are tiny pericardial effusions bilaterally.  Within the upper abdomen. A large amount of ascites is present. The stomach is moderately distended with food.  The thoracic spine and sternum and visualized portions of the ribs exhibit no acute abnormalities.  Review of the MIP images confirms the above findings.  IMPRESSION: 1. There is no acute pulmonary embolism nor acute thoracic aortic pathology. 2. There is enlargement of the cardiac chambers. There is increased interstitial density in the dependent portion of both lungs most compatible with mild interstitial edema. There is no alveolar pneumonia. There are underlying changes of COPD. 3. There is no pericardial effusion. There are small bilateral pleural effusions. There is a large amount of ascites.   Electronically Signed   By: David  JMartinique  On: 07/12/2013 11:08     Current Medications:  . amLODipine  10 mg Oral QHS  . atorvastatin  20 mg Oral Daily  . cloNIDine  0.3 mg Oral BID  . darbepoetin (ARANESP) injection - DIALYSIS  100 mcg Intravenous Q Wed-HD  . diazepam  5 mg Oral Once  . doxercalciferol  5 mcg Intravenous Q M,W,F-HD  . ferric gluconate (FERRLECIT/NULECIT) IV  62.5 mg Intravenous Q Wed-HD  . gabapentin  100 mg Oral TID  . heparin subcutaneous  5,000 Units Subcutaneous Q12H  . hydrALAZINE  50 mg Oral 3 times per day  . isosorbide mononitrate  30 mg Oral Daily  . levofloxacin  500 mg Oral Q48H  . lisinopril  40 mg Oral Daily  . metoprolol succinate  25 mg Oral Daily  . multivitamin  1 tablet Oral QHS  . sevelamer carbonate  2,400-5,600 mg Oral TID WC   . sodium chloride 10 mL/hr at 07/09/13 2200  . nitroGLYCERIN Stopped (07/10/13 1800)    ASSESSMENT AND PLAN: Principal Problem:   Acute respiratory failure with hypoxia - per IM, felt 2nd HCAP, volume overload  Active  Problems:   End stage renal disease - had HD today, only able to pull 1 liter off due to pt abd pain.    GERD (gastroesophageal reflux disease) - per IM    Anemia of renal disease - improved w/ transfusion, per IM/Renal teams    Abnormal stress test - for cath today at 1 pm    Chest pain at rest - see above     HCAP (healthcare-associated pneumonia) - Per IM, ABX changed to oral Rx    Elevated d-dimer - CT with no PE    Abnormal cardiovascular function study - for cath today.   Signed, RRosaria Ferries, PA-C 11:53 AM 07/13/2013  I have examined  the patient and reviewed assessment and plan and discussed with patient.  Agree with above as stated.  Cath today.  Ardyn Forge S.

## 2013-07-13 NOTE — CV Procedure (Signed)
    PROCEDURE:  Left heart catheterization with selective coronary angiography, left ventriculogram.  INDICATIONS:  Abnormal stress test  The risks, benefits, and details of the procedure were explained to the patient.  The patient verbalized understanding and wanted to proceed.  Informed written consent was obtained.  PROCEDURE TECHNIQUE:  After Xylocaine anesthesia a 46F sheath was placed in the right femoral artery with a single anterior needle wall stick.   Left coronary angiography was done using a Judkins L3.5 guide catheter.  Right coronary angiography was done using a Judkins R4 guide catheter.  Left ventriculography was done using a pigtail catheter.    CONTRAST:  Total of 45 cc.  COMPLICATIONS:  None.    HEMODYNAMICS:  Aortic pressure was 156/94; LV pressure was 156/3; LVEDP 28.  There was no gradient between the left ventricle and aorta.    ANGIOGRAPHIC DATA:   The left main coronary artery is widely patent.  The left anterior descending artery is a large vessel which wraps around the apex. There are several small diagonal vessels which are widely patent. The LAD system appears angiographically normal.  The left circumflex artery is a large vessel.  There are 2 large obtuse marginal vessels which radiographically normal. There is no significant disease in the circumflex system.  The right coronary artery is a large dominant vessel. The posterior lateral artery is large. The posterior descending artery is medium size. The RCA system appears angiographically normal.  LEFT VENTRICULOGRAM:  Left ventricular angiogram was not done since he still makes urine. We wanted to avoid the contrast exposure.  LVEDP was 28 mmHg.  IMPRESSIONS:  1. Normal left main coronary artery. 2. Normal left anterior descending artery and its branches. 3. Normal left circumflex artery and its branches. 4. Normal right coronary artery. 5. Normal left ventricular systolic function.  LVEDP 28 mmHg.   Ejection fraction not assessed.  RECOMMENDATION:  Continue aggressive preventive therapy. He needs blood pressure control.  Cardiology Followup with Dr. Meda Coffee.

## 2013-07-13 NOTE — Procedures (Signed)
Pt seen on HD.  Ap 230 Vp 180.  BFR 400.  To go for Ht cath today

## 2013-07-13 NOTE — Progress Notes (Signed)
[facility] PROGRESS NOTE  James Wall:[id number] DOB: 05-21-1965 DOA: 07/08/2013 PCP: [name], MD Brief NArrative James Wall is a 48 y.o. male with Past medical history of ESRD on HD, noncompliant often signs off early from HD, hypertension, COPD, GERD, anemia of chronic disease, abnormal stress test patient refused PCI last week since he would not undergo cath unless under general anesthesia. Patient presented with complaints of chest pain and fever, productive cough on and off since last 2 weeks. The chest pain is located on the right side and he is a sharp pain which gets worse when he takes a deep breath and when he has a cough. FOund to have HCAP and Hypertensive Urgency, with volume overload Was initially on Nitro gtt in SDu, now off this. BP stable now on Po meds Dialyzed extra per Renal Also being followed by Cardiology and cath planned after much discussion with patient.  Assessment/Plan: 1. HCAP -continue Vanc/Zosyn Day4, was changed to PO levaquin on 6/9 -1/2 blood Bx -coagulase negative staph c/w contaminant -clinically improving, continue Levaquin -D-dimer elevated on admission, but symptoms explained by CXR findings, do not suspect PE, CTA negative for PE  2. ESRD on HD with volume overload -non compliant and signs out before completing HD and Dietary non compliance too -Renal following -HD per renal  3. Hypertensive urgency -improved -off Nitro gtt -continue  clonidine PO, amlodipine, Hydralazine, lisinopril  4. CAD with abnormal myoview -continue ASA/metoprolol/statin -declined cath 6/1 because he wanted this under general anesthesia -Patient now agreeable to cath and it is planned for today   5. Anxiety -xanax PRN  6. Anemia of chronic disease -continue Aranesp, anemia panel c/w chronic disease -given 2 units in HD 6/8 in HD --His baseline Hb is in 8 range from 11/14, last week fluctuated in 9-10range which is higher  than his baseline -h/o EGD and colonoscopy last year per Dr.Outlaw-2 polyps removed otherwise benign per pt report, requested records, still pending  DVT proph: continue heparin  Code Status:Full Code Family Communication:none at bedside Disposition Plan: home pending workup   Consultants:  Renal  HPI/Subjective: Denies chest pain and no shortness of breath. Awaiting cardiac cath today.  Objective: Filed Vitals:   07/13/13 1030  BP: 156/87  Pulse: 79  Temp:   Resp:     Intake/Output Summary (Last 24 hours) at 07/13/13 1048 Last data filed at 07/12/13 2000  Gross per 24 hour  Intake    120 ml  Output      0 ml  Net    120 ml   Filed Weights   07/11/13 1332 07/12/13 2100 07/13/13 0712  Weight: 69.7 kg (153 lb 10.6 oz) 68.221 kg (150 lb 6.4 oz) 71.7 kg (158 lb 1.1 oz)    Exam:   General:  AAOx3,   Cardiovascular: S1S2/RRR  Respiratory: Decreased breath sounds at bases, few rhonchi  Abdomen: soft, Nt, BS present  Musculoskeletal:no edema c/c  Data Reviewed: Basic Metabolic Panel:  Recent Labs Lab 07/09/13 0300 07/10/13 0240 07/11/13 0800 07/12/13 0455 07/13/13 0500  NA 137 138 139 136* 136*  K 4.1 4.3 4.1 3.8 4.2  CL 94* 97 94* 93* 93*  CO2 28 28 28 27 25   GLUCOSE 93 104* 122* 96 80  BUN 29* 17 28* 18 32*  CREATININE 9.83* 7.22* 9.93* 6.66* 8.99*  CALCIUM 9.5 9.9 10.4 9.9 10.6*  MG 2.0  --   --   --   --   PHOS 4.7*  --  4.9*  --   --    Liver Function Tests:  Recent Labs Lab 07/08/13 1336 07/09/13 0300 07/11/13 0800  AST 13 9  --   ALT 5 <5  --   ALKPHOS 83 61  --   BILITOT 1.2 0.8  --   PROT 8.6* 7.0  --   ALBUMIN 3.2* 2.6* 2.5*   No results found for this basename: LIPASE, AMYLASE,  in the last 168 hours No results found for this basename: AMMONIA,  in the last 168 hours CBC:  Recent Labs Lab 07/08/13 1336 07/09/13 0300 07/09/13 0655 07/10/13 0240 07/11/13 0800 07/12/13 0455 07/13/13 0500  WBC 9.5 7.8  --  10.2 10.8*  10.0 11.0*  NEUTROABS 7.5 5.7  --   --   --   --   --   HGB 9.8* 7.8* 7.2* 7.4* 8.4* 10.0* 9.8*  HCT 30.6* 25.0* 22.7* 23.7* 26.9* 31.0* 30.4*  MCV 93.0 95.1  --  95.2 94.4 91.4 91.0  PLT 276 213 215 240 320 254 302   Cardiac Enzymes:  Recent Labs Lab 07/09/13 0300 07/09/13 0655  TROPONINI <0.30 <0.30   BNP (last 3 results)  Recent Labs  07/09/13 0300  PROBNP >70000.0*   CBG: No results found for this basename: GLUCAP,  in the last 168 hours  Recent Results (from the past 240 hour(s))  CULTURE, BLOOD (ROUTINE X 2)     Status: None   Collection Time    07/08/13  6:00 PM      Result Value Ref Range Status   Specimen Description BLOOD LEFT ARM   Final   Special Requests BOTTLES DRAWN AEROBIC AND ANAEROBIC 10CC   Final   Culture  Setup Time     Final   Value: 07/08/2013 22:39     Performed at [facility]   Culture     Final   Value:        BLOOD CULTURE RECEIVED NO GROWTH TO DATE CULTURE WILL BE HELD FOR 5 DAYS BEFORE ISSUING A FINAL NEGATIVE REPORT     Performed at [facility]   Report Status PENDING   Incomplete  CULTURE, BLOOD (ROUTINE X 2)     Status: None   Collection Time    07/08/13  6:10 PM      Result Value Ref Range Status   Specimen Description BLOOD LEFT HAND   Final   Special Requests BOTTLES DRAWN AEROBIC ONLY [facility]   Final   Culture  Setup Time     Final   Value: 07/08/2013 22:38     Performed at [facility]   Culture     Final   Value: STAPHYLOCOCCUS SPECIES (COAGULASE NEGATIVE)     Note: THE SIGNIFICANCE OF ISOLATING THIS ORGANISM FROM A SINGLE SET OF BLOOD CULTURES WHEN MULTIPLE SETS ARE DRAWN IS UNCERTAIN. PLEASE NOTIFY THE MICROBIOLOGY DEPARTMENT WITHIN ONE WEEK IF SPECIATION AND SENSITIVITIES ARE REQUIRED.     Note: Gram Stain Report Called to,Read Back By and Verified With: ERIN SMITH 07/09/13 @ 5:53PM BY RUSCOE A.     Performed at [facility]   Report Status 07/10/2013 FINAL   Final  MRSA PCR SCREENING      Status: None   Collection Time    07/09/13  1:58 AM      Result Value Ref Range Status   MRSA by PCR NEGATIVE  NEGATIVE Final   Comment:  The GeneXpert MRSA Assay (FDA     approved for NASAL specimens     only), is one component of a     comprehensive MRSA colonization     surveillance program. It is not     intended to diagnose MRSA     infection nor to guide or     monitor treatment for     MRSA infections.     Studies: Ct Angio Chest Pe W/cm &/or Wo Cm  07/12/2013   CLINICAL DATA:  ELEVATED d-DIMER ; chest pain with history of COPD and coronary artery disease; end-stage renal disease on hemodialysis; cirrhosis  EXAM: CT ANGIOGRAPHY CHEST WITH CONTRAST  TECHNIQUE: Multidetector CT imaging of the chest was performed using the standard protocol during bolus administration of intravenous contrast. Multiplanar CT image reconstructions and MIPs were obtained to evaluate the vascular anatomy.  CONTRAST:  69m OMNIPAQUE IOHEXOL 350 MG/ML SOLN  COMPARISON:  Portable chest x-ray of today's date.  FINDINGS: Contrast within the pulmonary arterial tree is normal. There are no filling defects to suggest an acute pulmonary embolism. The caliber of the thoracic aorta is normal. There is no false lumen. The cardiac chambers are enlarged. There is no pericardial effusion. There are a few borderline enlarged mediastinal lymph nodes but no bulky lymphadenopathy. The thyroid gland and thoracic esophagus are normal.  At lung window settings there are increased interstitial densities present in the dependent portion of both lungs. There are bullous lesions bilaterally. There are no air bronchograms. There are tiny pericardial effusions bilaterally.  Within the upper abdomen. A large amount of ascites is present. The stomach is moderately distended with food.  The thoracic spine and sternum and visualized portions of the ribs exhibit no acute abnormalities.  Review of the MIP images confirms the above  findings.  IMPRESSION: 1. There is no acute pulmonary embolism nor acute thoracic aortic pathology. 2. There is enlargement of the cardiac chambers. There is increased interstitial density in the dependent portion of both lungs most compatible with mild interstitial edema. There is no alveolar pneumonia. There are underlying changes of COPD. 3. There is no pericardial effusion. There are small bilateral pleural effusions. There is a large amount of ascites.   Electronically Signed   By: David  JMartinique  On: 07/12/2013 11:08    Scheduled Meds: . amLODipine  10 mg Oral QHS  . atorvastatin  20 mg Oral Daily  . cloNIDine  0.3 mg Oral BID  . darbepoetin (ARANESP) injection - DIALYSIS  100 mcg Intravenous Q Wed-HD  . diazepam  5 mg Oral Once  . doxercalciferol  5 mcg Intravenous Q M,W,F-HD  . ferric gluconate (FERRLECIT/NULECIT) IV  62.5 mg Intravenous Q Wed-HD  . gabapentin  100 mg Oral TID  . heparin  5,000 Units Intravenous BID  . hydrALAZINE  50 mg Oral 3 times per day  . isosorbide mononitrate  30 mg Oral Daily  . levofloxacin  500 mg Oral Q48H  . lisinopril  40 mg Oral Daily  . metoprolol succinate  25 mg Oral Daily  . multivitamin  1 tablet Oral QHS  . sevelamer carbonate  2,400-5,600 mg Oral TID WC   Continuous Infusions: . sodium chloride 10 mL/hr at 07/09/13 2200  . nitroGLYCERIN Stopped (07/10/13 1800)   Antibiotics Given (last 72 hours)   Date/Time Action Medication Dose Rate   07/10/13 1439 Given   piperacillin-tazobactam (ZOSYN) IVPB 2.25 g 2.25 g 100 mL/hr   07/10/13 2252 Given  piperacillin-tazobactam (ZOSYN) IVPB 2.25 g 2.25 g 100 mL/hr   07/11/13 0656 Given   piperacillin-tazobactam (ZOSYN) IVPB 2.25 g 2.25 g 100 mL/hr   07/11/13 1250 Given  [given in hemodialysis]   vancomycin (VANCOCIN) IVPB 750 mg/150 ml premix 750 mg 150 mL/hr   07/11/13 1547 Given   piperacillin-tazobactam (ZOSYN) IVPB 2.25 g 2.25 g 100 mL/hr   07/11/13 2257 Given   piperacillin-tazobactam  (ZOSYN) IVPB 2.25 g 2.25 g 100 mL/hr   07/12/13 1650 Given   levofloxacin (LEVAQUIN) tablet 500 mg 500 mg       Principal Problem:   Acute respiratory failure with hypoxia Active Problems:   End stage renal disease   GERD (gastroesophageal reflux disease)   Anemia of renal disease   Abnormal stress test   Chest pain at rest   HCAP (healthcare-associated pneumonia)   Elevated d-dimer   Abnormal cardiovascular function study     Time spent: 85mn    [facility] Pager [phone] If 7PM-7AM, please contact night-coverage at [email or web address], password [email or web address]6/11/2013, 10:48 AM  LOS: 5 days

## 2013-07-14 ENCOUNTER — Inpatient Hospital Stay (HOSPITAL_COMMUNITY): Payer: Medicare Other

## 2013-07-14 LAB — BASIC METABOLIC PANEL
BUN: 21 mg/dL (ref 6–23)
CALCIUM: 10.6 mg/dL — AB (ref 8.4–10.5)
CO2: 25 mEq/L (ref 19–32)
CREATININE: 6.74 mg/dL — AB (ref 0.50–1.35)
Chloride: 97 mEq/L (ref 96–112)
GFR calc Af Amer: 10 mL/min — ABNORMAL LOW (ref >90)
GFR, EST NON AFRICAN AMERICAN: 9 mL/min — AB (ref >90)
GLUCOSE: 80 mg/dL (ref 70–99)
Potassium: 4.7 mEq/L (ref 3.7–5.3)
Sodium: 138 mEq/L (ref 137–147)

## 2013-07-14 LAB — CBC
HCT: 30.6 % — ABNORMAL LOW (ref 39.0–52.0)
Hemoglobin: 9.6 g/dL — ABNORMAL LOW (ref 13.0–17.0)
MCH: 29.4 pg (ref 26.0–34.0)
MCHC: 31.4 g/dL (ref 30.0–36.0)
MCV: 93.6 fL (ref 78.0–100.0)
Platelets: 282 10*3/uL (ref 150–400)
RBC: 3.27 MIL/uL — ABNORMAL LOW (ref 4.22–5.81)
RDW: 16 % — ABNORMAL HIGH (ref 11.5–15.5)
WBC: 9.3 10*3/uL (ref 4.0–10.5)

## 2013-07-14 LAB — CULTURE, BLOOD (ROUTINE X 2): Culture: NO GROWTH

## 2013-07-14 MED ORDER — HYDROCODONE-ACETAMINOPHEN 5-325 MG PO TABS
1.0000 | ORAL_TABLET | Freq: Three times a day (TID) | ORAL | Status: DC | PRN
Start: 2013-07-14 — End: 2013-09-29

## 2013-07-14 MED ORDER — LEVOFLOXACIN 500 MG PO TABS: 500.0000 mg | ORAL_TABLET | ORAL | Status: DC

## 2013-07-14 MED ORDER — RENA-VITE PO TABS: 1.0000 | ORAL_TABLET | Freq: Every day | ORAL | Status: DC

## 2013-07-14 MED ORDER — CYCLOBENZAPRINE HCL 10 MG PO TABS: 10.0000 mg | ORAL_TABLET | Freq: Two times a day (BID) | ORAL | Status: DC | PRN

## 2013-07-14 NOTE — Progress Notes (Signed)
Patient Name: James Wall [facility] Date of Encounter: 07/14/2013     Principal Problem:   Acute respiratory failure with hypoxia Active Problems:   End stage renal disease   GERD (gastroesophageal reflux disease)   Anemia of renal disease   Abnormal stress test   Chest pain at rest   HCAP (healthcare-associated pneumonia)   Elevated d-dimer   Abnormal cardiovascular function study    SUBJECTIVE  Still have have R upper shoulder stiffness with lifting. He denies any significant chest pain. He had 1 episode of SOB during exertion while walking by nursing staff this morning around 8:45-8:50 am this morning. He states he had to hold on to the side rails to catch his breath.   CURRENT MEDS . amLODipine  10 mg Oral QHS  . atorvastatin  20 mg Oral Daily  . cloNIDine  0.3 mg Oral BID  . darbepoetin (ARANESP) injection - DIALYSIS  100 mcg Intravenous Q Wed-HD  . ferric gluconate (FERRLECIT/NULECIT) IV  62.5 mg Intravenous Q Wed-HD  . gabapentin  100 mg Oral TID  . heparin subcutaneous  5,000 Units Subcutaneous Q12H  . hydrALAZINE  50 mg Oral 3 times per day  . isosorbide mononitrate  30 mg Oral Daily  . levofloxacin  500 mg Oral Q48H  . lisinopril  40 mg Oral Daily  . metoprolol succinate  25 mg Oral Daily  . multivitamin  1 tablet Oral QHS  . sevelamer carbonate  2,400-5,600 mg Oral TID WC  . sodium chloride  3 mL Intravenous Q12H    OBJECTIVE  Filed Vitals:   07/13/13 1659 07/13/13 2100 07/14/13 0500 07/14/13 1033  BP: 167/79 133/67 144/74 122/67  Pulse: 87 102 85 84  Temp: 99.6 F (37.6 C) 99.1 F (37.3 C) 98.6 F (37 C) 98.2 F (36.8 C)  TempSrc: Oral Oral Oral Oral  Resp: 16 18 18 16   Height:      Weight:   154 lb (69.854 kg)   SpO2: 99% 95% 93% 98%    Intake/Output Summary (Last 24 hours) at 07/14/13 1048 Last data filed at 07/13/13 2215  Gross per 24 hour  Intake    120 ml  Output   1407 ml  Net  -1287 ml   Filed Weights   07/13/13 0712 07/13/13 1125  07/14/13 0500  Weight: 158 lb 1.1 oz (71.7 kg) 155 lb 6.8 oz (70.5 kg) 154 lb (69.854 kg)    PHYSICAL EXAM  General: Pleasant, NAD. Neuro: Alert and oriented X 3. Moves all extremities spontaneously. Psych: Normal affect. HEENT:  Normal  Neck: Supple without bruits or JVD. Lungs:  Resp regular and unlabored, CTA. Heart: RRR no s3, s4, or murmurs. Abdomen: Soft, non-tender, non-distended, BS + x 4.  Extremities: No clubbing, cyanosis or edema. DP/PT/Radials 2+ and equal bilaterally. AVF noted in R upper arm  Accessory Clinical Findings  CBC  Recent Labs  07/13/13 0500 07/14/13 0633  WBC 11.0* 9.3  HGB 9.8* 9.6*  HCT 30.4* 30.6*  MCV 91.0 93.6  PLT 302 124   Basic Metabolic Panel  Recent Labs  07/13/13 0500 07/14/13 0633  NA 136* 138  K 4.2 4.7  CL 93* 97  CO2 25 25  GLUCOSE 80 80  BUN 32* 21  CREATININE 8.99* 6.74*  CALCIUM 10.6* 10.6*    TELE  NSR with HR 70s, 1 episode of atrial tachycardia vs SVT with HR 170s this morning during exertion.  ECG  EKG 07/12/2013 NSR with HR 80s, t  wave inversion in lead I, V4-V6  Radiology/Studies  Dg Chest 2 View  07/08/2013   CLINICAL DATA:  Fever and chest pain  EXAM: CHEST  2 VIEW  COMPARISON:  07/04/2013  FINDINGS: Cardiac shadow is stable. The mild vascular congestion seen on the prior exam is stable. A dialysis catheter is again seen. No focal infiltrate or sizable effusion is noted. No bony abnormality is seen.  IMPRESSION: No change from the prior exam. Stable mild vascular congestion is seen.   Electronically Signed   By: Inez Catalina M.D.   On: 07/08/2013 13:24   Dg Chest 2 View  07/04/2013   CLINICAL DATA:  Chest pain and shortness of breath  EXAM: CHEST  2 VIEW  COMPARISON:  04/19/2013  FINDINGS: Cardiac shadow is stable. A left-sided dialysis catheter is again seen. The area mild vascular congestion is noted. No focal infiltrate or sizable effusion is seen.  IMPRESSION: Mild vascular congestion.  No focal edema is  noted.   Electronically Signed   By: Inez Catalina M.D.   On: 07/04/2013 11:26   Ct Angio Chest Pe W/cm &/or Wo Cm  07/12/2013   CLINICAL DATA:  ELEVATED d-DIMER ; chest pain with history of COPD and coronary artery disease; end-stage renal disease on hemodialysis; cirrhosis  EXAM: CT ANGIOGRAPHY CHEST WITH CONTRAST  TECHNIQUE: Multidetector CT imaging of the chest was performed using the standard protocol during bolus administration of intravenous contrast. Multiplanar CT image reconstructions and MIPs were obtained to evaluate the vascular anatomy.  CONTRAST:  54m OMNIPAQUE IOHEXOL 350 MG/ML SOLN  COMPARISON:  Portable chest x-ray of today's date.  FINDINGS: Contrast within the pulmonary arterial tree is normal. There are no filling defects to suggest an acute pulmonary embolism. The caliber of the thoracic aorta is normal. There is no false lumen. The cardiac chambers are enlarged. There is no pericardial effusion. There are a few borderline enlarged mediastinal lymph nodes but no bulky lymphadenopathy. The thyroid gland and thoracic esophagus are normal.  At lung window settings there are increased interstitial densities present in the dependent portion of both lungs. There are bullous lesions bilaterally. There are no air bronchograms. There are tiny pericardial effusions bilaterally.  Within the upper abdomen. A large amount of ascites is present. The stomach is moderately distended with food.  The thoracic spine and sternum and visualized portions of the ribs exhibit no acute abnormalities.  Review of the MIP images confirms the above findings.  IMPRESSION: 1. There is no acute pulmonary embolism nor acute thoracic aortic pathology. 2. There is enlargement of the cardiac chambers. There is increased interstitial density in the dependent portion of both lungs most compatible with mild interstitial edema. There is no alveolar pneumonia. There are underlying changes of COPD. 3. There is no pericardial  effusion. There are small bilateral pleural effusions. There is a large amount of ascites.   Electronically Signed   By: David  JMartinique  On: 07/12/2013 11:08   Dg Chest Port 1 View  07/09/2013   CLINICAL DATA:  Shortness of breath.  EXAM: PORTABLE CHEST - 1 VIEW  COMPARISON:  07/08/2013  FINDINGS: Dual-lumen right IJ central venous catheter is unchanged. Lungs are hypoinflated with worsening hazy opacification in the left base likely due to a small effusion with associated atelectasis. There is subtle worsening hazy opacification in the right base likely atelectasis. Cannot exclude infection in the lung bases. There are stable cardiomegaly. Remainder the exam is unchanged.  IMPRESSION: Worsening bibasilar opacification  left worse than right. Findings may be due to bibasilar atelectasis with small left effusion. Cannot exclude infection in the lung bases.  Left IJ central venous catheter unchanged.   Electronically Signed   By: Marin Olp M.D.   On: 07/09/2013 08:26    ASSESSMENT AND PLAN  1. Acute respiratory failure with hypoxia  - 2/2 possible HCAP, on levaquin  2. R shoulder stiffness with abnormal stress test  - originally unwilling to go through cath without general anesthesia, however eventually agreed to cath  - R shoulder stiffness likely related to musculoskeletal vs vascular problem (happened after lifting cans for exercise)  - Cath 07/13/2013 clean coronary, LVEDP 28, LV pressure 156/3  - Recommend continue control BP, follow up with Dr. Meda Coffee  - did have 1 episode of fast rhythm during exertion this morning between 8:45 until 8:50 am with associated SOB. (unclear if atrial tach vs SVT), can potentially increase metoprolol XL from 22m daily to 59mdaily if have recurrence  - likely stable to discharge today (or tomorrow) by primary team, f/u with Dr. NeMeda Coffeecheduled  3. Elevated d-dimer - CT with no PE  4. End stage renal disease  5. GERD  6. Anemia of renal disease - improved  w/ transfusion, per IM/Renal teams    Signed, MeAlmyra DeforestA Pager: [phone] Patient seen and examined. Agree with assessment and plan.Currently sinus rhythym in the 70's. R arm fistula with good thrill. No chest pain. No CAD at cath. OK from cardiac standpoint for dc today.   ThTroy SineMD, FAWenatchee Valley Hospital Dba Confluence Health [facility]/12/2013 11:30 AM

## 2013-07-14 NOTE — Discharge Summary (Signed)
Physician Discharge Summary  James Wall SAY:[id number] DOB: 1965/06/13 DOA: 07/08/2013  PCP: James Meckel, MD  Admit date: 07/08/2013 Discharge date: 07/14/2013  Time spent: >50mnutes  Recommendations for Outpatient Follow-up:  Follow-up Information   Follow up with [name] MD On 08/01/2013. (Follow up with Dr. NMeda Coffeeon 08/01/2013 at 7:30am)    Specialty:  Cardiology   Contact information:   [address] [place] [phone]     PCP/Dr. [name].   Discharge Diagnoses:  Principal Problem:   Acute respiratory failure with hypoxia Active Problems:   End stage renal disease   GERD (gastroesophageal reflux disease)   Anemia of renal disease   Abnormal stress test   Chest pain at rest   HCAP (healthcare-associated pneumonia)   Elevated d-dimer   Abnormal cardiovascular function study   Discharge Condition: Improved/stable  Diet recommendation: Renal/heart healthy  Filed Weights   07/13/13 0712 07/13/13 1125 07/14/13 0500  Weight: 71.7 kg (158 lb 1.1 oz) 70.5 kg (155 lb 6.8 oz) 69.854 kg (154 lb)    History of present illness:  EWhitten Andreoniis a 48y.o. male with Past medical history of chronic kidney disease, hypertension, COPD, GERD, abnormal stress test patient refused PCI, ESRD on hemodialysis, anemia.  Patient presented with complaints of chest pain and fever. Patient mentions he has been having shortness of breath and chest pain on and off since last 2 weeks. The chest pain is located on the right side and he is a sharp pain which gets worse when he takes a deep breath and when he has a dry cough.  He reported had been having some generalized weakness and has fallen multiple times. His last fall was on Sunday when he fell on his knee and had severe pain in his knee. He also has some pain in his neck and head since last few weeks. The pain in the neck is more on the right side along his  shoulder.   He complained of some acid reflux.  He denieed any lower extremity swelling.  He stated he otherwise is compliant with his hemodialysis but that missed his hemodialysis on Wednesday.  He had hemodialysis prior to his arrival to the ED. He was so weak that he was brought in the beachchair from hemodialysis.  He had some fever in the Hemodialysis.  He denies any orthopnea or PND. He was admitted for further evaluation and management.   Hospital Course:  1.HCAP -D-dimer elevated on admission, CXR with mild vascular congestion and followup chest x-ray on 6/6 showed worsening bibasal up opacification left worse than right cannot exclude infection in the lung bases, CTA was done on 6/9 and came back negative for PE  -As discussed above upon admission patient was started on empiric antibiotics with Vanc/Zosyn Day4, blood cultures obtained on admission came back with -1/2 blood Bx -coagulase negative staph c/w contaminant  -He was clinically  improving, and on followup changed to PO levaquin on 6/9  -he has continued to improve, he is oxygenating well on room air, afebrile and hemodynamically stable. He will be discharged on oral Levaquin to complete a treatment course and to followup with his PCP.  2. ESRD on HD with volume overload  -non compliant and signs out before completing HD and Dietary non compliance too  -Renal following  -HD per renal, follow up ouptpt as directed 3. Hypertensive urgency  - patient was initially placed on nitroglycerin and admitted to  the step down unit -as his BP control improved he was transitioned clonidine PO, amlodipine, Hydralazine, lisinopril -The importance of blood pressure control was emphasized as discussed #4 -Is to continue his preadmission medications and followup with his PCP  4. CAD with abnormal myoview  -continue ASA/metoprolol/statin  -declined cath 6/1 because he wanted this under general anesthesia  -Subsequently he agreed to the  cardiac cath and this was done on 6/10 and showed normal coronaries, he is not assessed. Recommendation was for him to continue aggressive preventive and the blood pressure control and followup with Dr. Meda Wall -He has remained chest pain-free and is to follow up outpatient. 5. Anxiety  -xanax PRN  6. Anemia of chronic disease  -continue Aranesp, anemia panel c/w chronic disease  -He was transfused 2 units in HD 6/8 in HD  --His baseline Hb is in 8 range from 11/14, last week fluctuated in 9-10range which is higher than his baseline  -h/o EGD and colonoscopy last year per Dr.Outlaw-2 polyps removed otherwise benign per pt report, requested records, 7. Right shoulder pain -Patient complained of pain in right shoulder following strengthening right arm exercises. -X-ray was done at that right shoulder and was negative for acute findings -Was managed symptomatically and is to follow up outpatient.  Code Status:Full Code  Family Communication:none at bedside  Disposition Plan: home pending workup  Consultants:  Renal   Procedures:  Cardiac catheterization IMPRESSIONS:  1. Normal left main coronary artery. 2. Normal left anterior descending artery and its branches. 3. Normal left circumflex artery and its branches. 4. Normal right coronary artery. 5. Normal left ventricular systolic function. LVEDP 28 mmHg. Ejection fraction not assessed. RECOMMENDATION: Continue aggressive preventive therapy. He needs blood pressure control. Cardiology Followup with Dr. Meda Wall.  Consultations:  Cardiology  Renal  Discharge Exam: Filed Vitals:   07/14/13 1033  BP: 122/67  Pulse: 84  Temp: 98.2 F (36.8 C)  Resp: 16   Exam:  General: AAOx3,  Cardiovascular: S1S2/RRR  Respiratory: Decreased breath sounds at bases, few rhonchi  Abdomen: soft, Nt, BS present  Musculoskeletal:no edema c/c   Discharge Instructions You were cared for by a hospitalist during your hospital stay. If you have  any questions about your discharge medications or the care you received while you were in the hospital after you are discharged, you can call the unit and asked to speak with the hospitalist on call if the hospitalist that took care of you is not available. Once you are discharged, your primary care physician will handle any further medical issues. Please note that NO REFILLS for any discharge medications will be authorized once you are discharged, as it is imperative that you return to your primary care physician (or establish a relationship with a primary care physician if you do not have one) for your aftercare needs so that they can reassess your need for medications and monitor your lab values.  Discharge Instructions   Diet renal [id number]    Complete by:  As directed      Increase activity slowly    Complete by:  As directed             Medication List         ALPRAZolam 1 MG tablet  Commonly known as:  XANAX  Take 1 mg by mouth at bedtime as needed for anxiety.     amLODipine 10 MG tablet  Commonly known as:  NORVASC  Take 10 mg by mouth at bedtime.  atorvastatin 20 MG tablet  Commonly known as:  LIPITOR  Take 1 tablet (20 mg total) by mouth daily.     cloNIDine 0.3 mg/24hr patch  Commonly known as:  CATAPRES - Dosed in mg/24 hr  Place 1 patch onto the skin once a week. Saturday.     cyclobenzaprine 10 MG tablet  Commonly known as:  FLEXERIL  Take 1 tablet (10 mg total) by mouth 2 (two) times daily as needed for muscle spasms.     esomeprazole 40 MG capsule  Commonly known as:  NEXIUM  Take 40-80 mg by mouth daily as needed (reflux/indigestion. Dose varies depending on severity.).     gabapentin 100 MG capsule  Commonly known as:  NEURONTIN  Take 100 mg by mouth 3 (three) times daily.     HYDROcodone-acetaminophen 5-325 MG per tablet  Commonly known as:  NORCO/VICODIN  Take 1 tablet by mouth 3 (three) times daily as needed for moderate pain or severe pain.      isosorbide mononitrate 30 MG 24 hr tablet  Commonly known as:  IMDUR  Take 1 tablet (30 mg total) by mouth daily.     levofloxacin 500 MG tablet  Commonly known as:  LEVAQUIN  Take 1 tablet (500 mg total) by mouth every other day.     lisinopril 40 MG tablet  Commonly known as:  PRINIVIL,ZESTRIL  Take 40 mg by mouth at bedtime.     metoprolol succinate 25 MG 24 hr tablet  Commonly known as:  TOPROL-XL  Take 1 tablet (25 mg total) by mouth daily.     multivitamin Tabs tablet  Take 1 tablet by mouth at bedtime.     sevelamer carbonate 800 MG tablet  Commonly known as:  RENVELA  Take 2,400-5,600 mg by mouth 3 (three) times daily with meals. Takes 3-4 tablets if the meal is light and 6-7 tablets if he eats a big meal.       Allergies  Allergen Reactions  . Aspirin     Tends to make him "feel worse" when he tries to take it for a cold or something       Follow-up Information   Follow up with James Spark, MD On 08/01/2013. (Follow up with Dr. Meda Wall on 08/01/2013 at 7:30am)    Specialty:  Cardiology   Contact information:   [place] [address] [phone]        The results of significant diagnostics from this hospitalization (including imaging, microbiology, ancillary and laboratory) are listed below for reference.    Significant Diagnostic Studies: Dg Chest 2 View  07/08/2013   CLINICAL DATA:  Fever and chest pain  EXAM: CHEST  2 VIEW  COMPARISON:  07/04/2013  FINDINGS: Cardiac shadow is stable. The mild vascular congestion seen on the prior exam is stable. A dialysis catheter is again seen. No focal infiltrate or sizable effusion is noted. No bony abnormality is seen.  IMPRESSION: No change from the prior exam. Stable mild vascular congestion is seen.   Electronically Signed   By: Inez Catalina M.D.   On: 07/08/2013 13:24   Dg Chest 2 View  07/04/2013   CLINICAL DATA:  Chest pain and shortness of breath  EXAM: CHEST  2 VIEW   COMPARISON:  04/19/2013  FINDINGS: Cardiac shadow is stable. A left-sided dialysis catheter is again seen. The area mild vascular congestion is noted. No focal infiltrate or sizable effusion is seen.  IMPRESSION: Mild vascular congestion.  No focal edema is noted.   Electronically Signed   By: Inez Catalina M.D.   On: 07/04/2013 11:26   Dg Shoulder Right  07/14/2013   CLINICAL DATA:  Right shoulder pain post working out  EXAM: RIGHT SHOULDER - 2+ VIEW  COMPARISON:  None.  FINDINGS: Three views of the right shoulder submitted. No acute fracture or subluxation. No radiopaque foreign body.  IMPRESSION: Negative.   Electronically Signed   By: Lahoma Crocker M.D.   On: 07/14/2013 12:21   Ct Angio Chest Pe W/cm &/or Wo Cm  07/12/2013   CLINICAL DATA:  ELEVATED d-DIMER ; chest pain with history of COPD and coronary artery disease; end-stage renal disease on hemodialysis; cirrhosis  EXAM: CT ANGIOGRAPHY CHEST WITH CONTRAST  TECHNIQUE: Multidetector CT imaging of the chest was performed using the standard protocol during bolus administration of intravenous contrast. Multiplanar CT image reconstructions and MIPs were obtained to evaluate the vascular anatomy.  CONTRAST:  97m OMNIPAQUE IOHEXOL 350 MG/ML SOLN  COMPARISON:  Portable chest x-ray of today's date.  FINDINGS: Contrast within the pulmonary arterial tree is normal. There are no filling defects to suggest an acute pulmonary embolism. The caliber of the thoracic aorta is normal. There is no false lumen. The cardiac chambers are enlarged. There is no pericardial effusion. There are a few borderline enlarged mediastinal lymph nodes but no bulky lymphadenopathy. The thyroid gland and thoracic esophagus are normal.  At lung window settings there are increased interstitial densities present in the dependent portion of both lungs. There are bullous lesions bilaterally. There are no air bronchograms. There are tiny pericardial effusions bilaterally.  Within the upper abdomen.  A large amount of ascites is present. The stomach is moderately distended with food.  The thoracic spine and sternum and visualized portions of the ribs exhibit no acute abnormalities.  Review of the MIP images confirms the above findings.  IMPRESSION: 1. There is no acute pulmonary embolism nor acute thoracic aortic pathology. 2. There is enlargement of the cardiac chambers. There is increased interstitial density in the dependent portion of both lungs most compatible with mild interstitial edema. There is no alveolar pneumonia. There are underlying changes of COPD. 3. There is no pericardial effusion. There are small bilateral pleural effusions. There is a large amount of ascites.   Electronically Signed   By: David  JMartinique  On: 07/12/2013 11:08   Dg Chest Port 1 View  07/09/2013   CLINICAL DATA:  Shortness of breath.  EXAM: PORTABLE CHEST - 1 VIEW  COMPARISON:  07/08/2013  FINDINGS: Dual-lumen right IJ central venous catheter is unchanged. Lungs are hypoinflated with worsening hazy opacification in the left base likely due to a small effusion with associated atelectasis. There is subtle worsening hazy opacification in the right base likely atelectasis. Cannot exclude infection in the lung bases. There are stable cardiomegaly. Remainder the exam is unchanged.  IMPRESSION: Worsening bibasilar opacification left worse than right. Findings may be due to bibasilar atelectasis with small left effusion. Cannot exclude infection in the lung bases.  Left IJ central venous catheter unchanged.   Electronically Signed   By: DMarin OlpM.D.   On: 07/09/2013 08:26    Microbiology: Recent Results (from the past 240 hour(s))  CULTURE, BLOOD (ROUTINE X 2)     Status: None   Collection Time    07/08/13  6:00 PM      Result Value Ref Range Status   Specimen Description BLOOD LEFT ARM  Final   Special Requests BOTTLES DRAWN AEROBIC AND ANAEROBIC 10CC   Final   Culture  Setup Time     Final   Value: 07/08/2013  22:39     Performed at [facility]   Culture     Final   Value: NO GROWTH 5 DAYS     Performed at [facility]   Report Status 07/14/2013 FINAL   Final  CULTURE, BLOOD (ROUTINE X 2)     Status: None   Collection Time    07/08/13  6:10 PM      Result Value Ref Range Status   Specimen Description BLOOD LEFT HAND   Final   Special Requests BOTTLES DRAWN AEROBIC ONLY [facility]   Final   Culture  Setup Time     Final   Value: 07/08/2013 22:38     Performed at [facility]   Culture     Final   Value: STAPHYLOCOCCUS SPECIES (COAGULASE NEGATIVE)     Note: THE SIGNIFICANCE OF ISOLATING THIS ORGANISM FROM A SINGLE SET OF BLOOD CULTURES WHEN MULTIPLE SETS ARE DRAWN IS UNCERTAIN. PLEASE NOTIFY THE MICROBIOLOGY DEPARTMENT WITHIN ONE WEEK IF SPECIATION AND SENSITIVITIES ARE REQUIRED.     Note: Gram Stain Report Called to,Read Back By and Verified With: ERIN SMITH 07/09/13 @ 5:53PM BY RUSCOE A.     Performed at [facility]   Report Status 07/10/2013 FINAL   Final  MRSA PCR SCREENING     Status: None   Collection Time    07/09/13  1:58 AM      Result Value Ref Range Status   MRSA by PCR NEGATIVE  NEGATIVE Final   Comment:            The GeneXpert MRSA Assay (FDA     approved for NASAL specimens     only), is one component of a     comprehensive MRSA colonization     surveillance program. It is not     intended to diagnose MRSA     infection nor to guide or     monitor treatment for     MRSA infections.     Labs: Basic Metabolic Panel:  Recent Labs Lab 07/09/13 0300 07/10/13 0240 07/11/13 0800 07/12/13 0455 07/13/13 0500 07/14/13 0633  NA 137 138 139 136* 136* 138  K 4.1 4.3 4.1 3.8 4.2 4.7  CL 94* 97 94* 93* 93* 97  CO2 28 28 28 27 25 25   GLUCOSE 93 104* 122* 96 80 80  BUN 29* 17 28* 18 32* 21  CREATININE 9.83* 7.22* 9.93* 6.66* 8.99* 6.74*  CALCIUM 9.5 9.9 10.4 9.9 10.6* 10.6*  MG 2.0  --   --   --   --   --   PHOS 4.7*  --  4.9*  --   --   --     Liver Function Tests:  Recent Labs Lab 07/08/13 1336 07/09/13 0300 07/11/13 0800  AST 13 9  --   ALT 5 <5  --   ALKPHOS 83 61  --   BILITOT 1.2 0.8  --   PROT 8.6* 7.0  --   ALBUMIN 3.2* 2.6* 2.5*   No results found for this basename: LIPASE, AMYLASE,  in the last 168 hours No results found for this basename: AMMONIA,  in the last 168 hours CBC:  Recent Labs Lab 07/08/13 1336 07/09/13 0300  07/10/13 0240 07/11/13 0800 07/12/13 0455 07/13/13 0500 07/14/13 0633  WBC  9.5 7.8  --  10.2 10.8* 10.0 11.0* 9.3  NEUTROABS 7.5 5.7  --   --   --   --   --   --   HGB 9.8* 7.8*  < > 7.4* 8.4* 10.0* 9.8* 9.6*  HCT 30.6* 25.0*  < > 23.7* 26.9* 31.0* 30.4* 30.6*  MCV 93.0 95.1  --  95.2 94.4 91.4 91.0 93.6  PLT 276 213  < > 240 320 254 302 282  < > = values in this interval not displayed. Cardiac Enzymes:  Recent Labs Lab 07/09/13 0300 07/09/13 0655  TROPONINI <0.30 <0.30   BNP: BNP (last 3 results)  Recent Labs  07/09/13 0300  PROBNP >70000.0*   CBG: No results found for this basename: GLUCAP,  in the last 168 hours     Signed:  Colby Reels C  [facility] 07/14/2013, 1:01 PM

## 2013-07-14 NOTE — Progress Notes (Addendum)
[facility] Progress Note  Assessment/Plan: 1. SOB/hypoxemia -secondary to volume excess/COPD - he has been extremely resistant to lowering edw over the years - still has crackles at bases 2. ESRD - MWF - plan to use AVF tomorrow - has habitually signed off early over the years 3. Anemia - hgb up to 10 after transfusion, low Hgb may have been contributory to sx; tsat 35% ferritin 1060; low dose weekly Fe; Aranesp 100/week Hgb 9.6 6/11 stable 4. Secondary hyperparathyroidism - hectorol 5 + renvela; hectorol recently lowered from 6 to 5; still with ^ Ca - will use 2 Ca bath - hectorol held - may be able to resume at lower dose at discharge. 5. HTN/volume - BP improved overall; UF 2.9 Monday - with post weight 69.7 - UF 1.4 Wed with post weight 70.5 significantly below EDW - titrate further need to get standing weights; will make new EDW 69.5 6. Nutrition - renal diet  7. + stress test- heart cath neg yesterday - needs BP control - reinforced this with him that cards has said this though his own renal MD/extenders have been telling him this for years. 8. Anxiety/depression - only treated with xanax; think he would benefit by a long acting SSRI with prn xanax  9. Hx cirrhosis, ascites, unclear etiology  10. Chronic GI pain - sees GI/Outlaw 11. Right shoulder pain - defer to primary 12. Disp - ok for d/c today per renal with lower edw 13 Mild hypercalcemia, will hold hectorol for now  Myriam Jacobson, PA-C [place] [phone] 07/14/2013,8:37 AM  LOS: 6 days   Subjective:   C/o right shoulder pain and decreased ROM, he thinks due to exercising his AVF by lifting soup cans.  Objective Filed Vitals:   07/13/13 1534 07/13/13 1659 07/13/13 2100 07/14/13 0500  BP: 102/53 167/79 133/67 144/74  Pulse: 105 87 102 85  Temp: 99.7 F (37.6 C) 99.6 F (37.6 C) 99.1 F (37.3 C) 98.6 F (37 C)  TempSrc: Oral Oral Oral Oral  Resp: 16 16 18 18   Height:       Weight:    69.854 kg (154 lb)  SpO2: 100% 99% 95% 93%   Physical Exam General: NAD Heart: RRR Lungs: scattered short insp wheezes, few crackles at bases Abdomen:+ ascites Extremities: no LE edema Dialysis Access:  Patent right upper AVF left I-J  Dialysis Orders:  [facility] 4h 73.5kg RUA AVF 2K 2 Ca450/A1.5 Heparin 1800 Aranesp 100 ug q Wed Venofer 50/wed Hect 5 mcg pth 115, TSat 33%   Additional Objective Labs: Basic Metabolic Panel:  Recent Labs Lab 07/09/13 0300  07/11/13 0800 07/12/13 0455 07/13/13 0500 07/14/13 0633  NA 137  < > 139 136* 136* 138  K 4.1  < > 4.1 3.8 4.2 4.7  CL 94*  < > 94* 93* 93* 97  CO2 28  < > 28 27 25 25   GLUCOSE 93  < > 122* 96 80 80  BUN 29*  < > 28* 18 32* 21  CREATININE 9.83*  < > 9.93* 6.66* 8.99* 6.74*  CALCIUM 9.5  < > 10.4 9.9 10.6* 10.6*  PHOS 4.7*  --  4.9*  --   --   --   < > = values in this interval not displayed. Liver Function Tests:  Recent Labs Lab 07/08/13 1336 07/09/13 0300 07/11/13 0800  AST 13 9  --   ALT 5 <5  --   ALKPHOS 83 61  --  BILITOT 1.2 0.8  --   PROT 8.6* 7.0  --   ALBUMIN 3.2* 2.6* 2.5*   CBC:  Recent Labs Lab 07/08/13 1336 07/09/13 0300  07/10/13 0240 07/11/13 0800 07/12/13 0455 07/13/13 0500 07/14/13 0633  WBC 9.5 7.8  --  10.2 10.8* 10.0 11.0* 9.3  NEUTROABS 7.5 5.7  --   --   --   --   --   --   HGB 9.8* 7.8*  < > 7.4* 8.4* 10.0* 9.8* 9.6*  HCT 30.6* 25.0*  < > 23.7* 26.9* 31.0* 30.4* 30.6*  MCV 93.0 95.1  --  95.2 94.4 91.4 91.0 93.6  PLT 276 213  < > 240 320 254 302 282  < > = values in this interval not displayed. Blood Culture    Component Value Date/Time   SDES URINE, RANDOM 07/09/2013 0902   SPECREQUEST NONE 07/09/2013 0902   CULT  Value: STAPHYLOCOCCUS SPECIES (COAGULASE NEGATIVE) Note: THE SIGNIFICANCE OF ISOLATING THIS ORGANISM FROM A SINGLE SET OF BLOOD CULTURES WHEN MULTIPLE SETS ARE DRAWN IS UNCERTAIN. PLEASE NOTIFY THE MICROBIOLOGY DEPARTMENT WITHIN ONE WEEK IF SPECIATION  AND SENSITIVITIES ARE REQUIRED. Note: Gram Stain Report Called to,Read Back By and Verified With: ERIN SMITH 07/09/13 @ 5:53PM BY RUSCOE A. Performed at [facility] 07/08/2013 1810   REPTSTATUS 07/10/2013 FINAL 07/09/2013 0902    Cardiac Enzymes:  Recent Labs Lab 07/09/13 0300 07/09/13 0655  TROPONINI <0.30 <0.30  Studies/Results: Ct Angio Chest Pe W/cm &/or Wo Cm  07/12/2013   CLINICAL DATA:  ELEVATED d-DIMER ; chest pain with history of COPD and coronary artery disease; end-stage renal disease on hemodialysis; cirrhosis  EXAM: CT ANGIOGRAPHY CHEST WITH CONTRAST  TECHNIQUE: Multidetector CT imaging of the chest was performed using the standard protocol during bolus administration of intravenous contrast. Multiplanar CT image reconstructions and MIPs were obtained to evaluate the vascular anatomy.  CONTRAST:  25m OMNIPAQUE IOHEXOL 350 MG/ML SOLN  COMPARISON:  Portable chest x-ray of today's date.  FINDINGS: Contrast within the pulmonary arterial tree is normal. There are no filling defects to suggest an acute pulmonary embolism. The caliber of the thoracic aorta is normal. There is no false lumen. The cardiac chambers are enlarged. There is no pericardial effusion. There are a few borderline enlarged mediastinal lymph nodes but no bulky lymphadenopathy. The thyroid gland and thoracic esophagus are normal.  At lung window settings there are increased interstitial densities present in the dependent portion of both lungs. There are bullous lesions bilaterally. There are no air bronchograms. There are tiny pericardial effusions bilaterally.  Within the upper abdomen. A large amount of ascites is present. The stomach is moderately distended with food.  The thoracic spine and sternum and visualized portions of the ribs exhibit no acute abnormalities.  Review of the MIP images confirms the above findings.  IMPRESSION: 1. There is no acute pulmonary embolism nor acute thoracic aortic pathology. 2. There is  enlargement of the cardiac chambers. There is increased interstitial density in the dependent portion of both lungs most compatible with mild interstitial edema. There is no alveolar pneumonia. There are underlying changes of COPD. 3. There is no pericardial effusion. There are small bilateral pleural effusions. There is a large amount of ascites.   Electronically Signed   By: David  JMartinique  On: 07/12/2013 11:08   Medications: . nitroGLYCERIN Stopped (07/10/13 1800)   . amLODipine  10 mg Oral QHS  . atorvastatin  20 mg Oral Daily  . cloNIDine  0.3 mg Oral BID  . darbepoetin (ARANESP) injection - DIALYSIS  100 mcg Intravenous Q Wed-HD  . doxercalciferol  5 mcg Intravenous Q M,W,F-HD  . ferric gluconate (FERRLECIT/NULECIT) IV  62.5 mg Intravenous Q Wed-HD  . gabapentin  100 mg Oral TID  . heparin subcutaneous  5,000 Units Subcutaneous Q12H  . hydrALAZINE  50 mg Oral 3 times per day  . isosorbide mononitrate  30 mg Oral Daily  . levofloxacin  500 mg Oral Q48H  . lisinopril  40 mg Oral Daily  . metoprolol succinate  25 mg Oral Daily  . multivitamin  1 tablet Oral QHS  . sevelamer carbonate  2,400-5,600 mg Oral TID WC  . sodium chloride  3 mL Intravenous Q12H   I have seen and examined this patient and agree with plan per Amalia Hailey.  Some SOB with ambulation but no O2 sat taken.  Note cath results showing no sig CAD.  Still CO Rt shoulder pain, suspect muscular in origin.  Spoke with nurse, she will walk him and check O2 sats.  OK for DC from renal standpoint.  HD tomorrow if not DC'd MATTINGLY,MICHAEL T,MD 07/14/2013 10:30 AM

## 2013-07-14 NOTE — Progress Notes (Signed)
Patient Discharge:  Patient was discharged to home accompanied by his brother in the w/c.  He was stable with no c/o or any discomforts.   Education: Patient was educated about his cardiac cath site care. IV: IV removed before discharge. Telemetry: Telemetry removed before discharge and notified CCMD. Follow-up appointments:  Follow-up appointments reviewed before discharge. Prescriptions: Hard scripts given to the patient and also reviewed his medications. Belongings: Patient took all his belongings.

## 2013-07-14 NOTE — Progress Notes (Addendum)
Ambulated patient to obtain walking oxygen saturations.  Patient tolerated well.  SpO2 on room air between 94-96% throughout walk.  HR remained 97-101 bpm.  Patient denied shortness of breath or dizziness.

## 2013-07-14 NOTE — Progress Notes (Signed)
Patient ambulated in hallway.  HR remained between 100-110 bpm.  Patient denied dizziness.  Near end of walk, patient stated that he felt short of breath.  Placed 2L of oxygen via nasal cannula.   Will continue to monitor

## 2013-07-15 DIAGNOSIS — N186 End stage renal disease: Secondary | ICD-10-CM | POA: Diagnosis not present

## 2013-07-15 DIAGNOSIS — D631 Anemia in chronic kidney disease: Secondary | ICD-10-CM | POA: Diagnosis not present

## 2013-07-15 DIAGNOSIS — Z992 Dependence on renal dialysis: Secondary | ICD-10-CM | POA: Diagnosis not present

## 2013-07-15 DIAGNOSIS — N2581 Secondary hyperparathyroidism of renal origin: Secondary | ICD-10-CM | POA: Diagnosis not present

## 2013-07-15 DIAGNOSIS — E119 Type 2 diabetes mellitus without complications: Secondary | ICD-10-CM | POA: Diagnosis not present

## 2013-07-15 DIAGNOSIS — N039 Chronic nephritic syndrome with unspecified morphologic changes: Secondary | ICD-10-CM | POA: Diagnosis not present

## 2013-07-15 DIAGNOSIS — D509 Iron deficiency anemia, unspecified: Secondary | ICD-10-CM | POA: Diagnosis not present

## 2013-07-18 DIAGNOSIS — D509 Iron deficiency anemia, unspecified: Secondary | ICD-10-CM | POA: Diagnosis not present

## 2013-07-18 DIAGNOSIS — D631 Anemia in chronic kidney disease: Secondary | ICD-10-CM | POA: Diagnosis not present

## 2013-07-18 DIAGNOSIS — E119 Type 2 diabetes mellitus without complications: Secondary | ICD-10-CM | POA: Diagnosis not present

## 2013-07-18 DIAGNOSIS — N2581 Secondary hyperparathyroidism of renal origin: Secondary | ICD-10-CM | POA: Diagnosis not present

## 2013-07-18 DIAGNOSIS — N186 End stage renal disease: Secondary | ICD-10-CM | POA: Diagnosis not present

## 2013-07-18 DIAGNOSIS — Z992 Dependence on renal dialysis: Secondary | ICD-10-CM | POA: Diagnosis not present

## 2013-07-19 DIAGNOSIS — Z09 Encounter for follow-up examination after completed treatment for conditions other than malignant neoplasm: Secondary | ICD-10-CM | POA: Diagnosis not present

## 2013-07-19 DIAGNOSIS — M542 Cervicalgia: Secondary | ICD-10-CM | POA: Diagnosis not present

## 2013-07-19 DIAGNOSIS — I10 Essential (primary) hypertension: Secondary | ICD-10-CM | POA: Diagnosis not present

## 2013-07-19 DIAGNOSIS — R269 Unspecified abnormalities of gait and mobility: Secondary | ICD-10-CM | POA: Diagnosis not present

## 2013-07-19 DIAGNOSIS — J189 Pneumonia, unspecified organism: Secondary | ICD-10-CM | POA: Diagnosis not present

## 2013-07-20 DIAGNOSIS — N2581 Secondary hyperparathyroidism of renal origin: Secondary | ICD-10-CM | POA: Diagnosis not present

## 2013-07-20 DIAGNOSIS — Z992 Dependence on renal dialysis: Secondary | ICD-10-CM | POA: Diagnosis not present

## 2013-07-20 DIAGNOSIS — N186 End stage renal disease: Secondary | ICD-10-CM | POA: Diagnosis not present

## 2013-07-20 DIAGNOSIS — E119 Type 2 diabetes mellitus without complications: Secondary | ICD-10-CM | POA: Diagnosis not present

## 2013-07-20 DIAGNOSIS — D509 Iron deficiency anemia, unspecified: Secondary | ICD-10-CM | POA: Diagnosis not present

## 2013-07-20 DIAGNOSIS — D631 Anemia in chronic kidney disease: Secondary | ICD-10-CM | POA: Diagnosis not present

## 2013-07-22 ENCOUNTER — Encounter: Payer: Self-pay | Admitting: *Deleted

## 2013-07-22 DIAGNOSIS — N2581 Secondary hyperparathyroidism of renal origin: Secondary | ICD-10-CM | POA: Diagnosis not present

## 2013-07-22 DIAGNOSIS — E119 Type 2 diabetes mellitus without complications: Secondary | ICD-10-CM | POA: Diagnosis not present

## 2013-07-22 DIAGNOSIS — Z992 Dependence on renal dialysis: Secondary | ICD-10-CM | POA: Diagnosis not present

## 2013-07-22 DIAGNOSIS — N186 End stage renal disease: Secondary | ICD-10-CM | POA: Diagnosis not present

## 2013-07-22 DIAGNOSIS — D509 Iron deficiency anemia, unspecified: Secondary | ICD-10-CM | POA: Diagnosis not present

## 2013-07-22 DIAGNOSIS — D631 Anemia in chronic kidney disease: Secondary | ICD-10-CM | POA: Diagnosis not present

## 2013-07-25 DIAGNOSIS — D509 Iron deficiency anemia, unspecified: Secondary | ICD-10-CM | POA: Diagnosis not present

## 2013-07-25 DIAGNOSIS — Z992 Dependence on renal dialysis: Secondary | ICD-10-CM | POA: Diagnosis not present

## 2013-07-25 DIAGNOSIS — N186 End stage renal disease: Secondary | ICD-10-CM | POA: Diagnosis not present

## 2013-07-25 DIAGNOSIS — D631 Anemia in chronic kidney disease: Secondary | ICD-10-CM | POA: Diagnosis not present

## 2013-07-25 DIAGNOSIS — E119 Type 2 diabetes mellitus without complications: Secondary | ICD-10-CM | POA: Diagnosis not present

## 2013-07-25 DIAGNOSIS — N2581 Secondary hyperparathyroidism of renal origin: Secondary | ICD-10-CM | POA: Diagnosis not present

## 2013-07-27 DIAGNOSIS — N2581 Secondary hyperparathyroidism of renal origin: Secondary | ICD-10-CM | POA: Diagnosis not present

## 2013-07-27 DIAGNOSIS — Z992 Dependence on renal dialysis: Secondary | ICD-10-CM | POA: Diagnosis not present

## 2013-07-27 DIAGNOSIS — E119 Type 2 diabetes mellitus without complications: Secondary | ICD-10-CM | POA: Diagnosis not present

## 2013-07-27 DIAGNOSIS — D631 Anemia in chronic kidney disease: Secondary | ICD-10-CM | POA: Diagnosis not present

## 2013-07-27 DIAGNOSIS — D509 Iron deficiency anemia, unspecified: Secondary | ICD-10-CM | POA: Diagnosis not present

## 2013-07-27 DIAGNOSIS — N186 End stage renal disease: Secondary | ICD-10-CM | POA: Diagnosis not present

## 2013-07-29 DIAGNOSIS — N186 End stage renal disease: Secondary | ICD-10-CM | POA: Diagnosis not present

## 2013-07-29 DIAGNOSIS — N2581 Secondary hyperparathyroidism of renal origin: Secondary | ICD-10-CM | POA: Diagnosis not present

## 2013-07-29 DIAGNOSIS — E119 Type 2 diabetes mellitus without complications: Secondary | ICD-10-CM | POA: Diagnosis not present

## 2013-07-29 DIAGNOSIS — D631 Anemia in chronic kidney disease: Secondary | ICD-10-CM | POA: Diagnosis not present

## 2013-07-29 DIAGNOSIS — Z992 Dependence on renal dialysis: Secondary | ICD-10-CM | POA: Diagnosis not present

## 2013-07-29 DIAGNOSIS — D509 Iron deficiency anemia, unspecified: Secondary | ICD-10-CM | POA: Diagnosis not present

## 2013-08-01 ENCOUNTER — Ambulatory Visit: Payer: Medicare Other | Admitting: Cardiology

## 2013-08-01 DIAGNOSIS — E119 Type 2 diabetes mellitus without complications: Secondary | ICD-10-CM | POA: Diagnosis not present

## 2013-08-01 DIAGNOSIS — Z992 Dependence on renal dialysis: Secondary | ICD-10-CM | POA: Diagnosis not present

## 2013-08-01 DIAGNOSIS — D631 Anemia in chronic kidney disease: Secondary | ICD-10-CM | POA: Diagnosis not present

## 2013-08-01 DIAGNOSIS — N2581 Secondary hyperparathyroidism of renal origin: Secondary | ICD-10-CM | POA: Diagnosis not present

## 2013-08-01 DIAGNOSIS — D509 Iron deficiency anemia, unspecified: Secondary | ICD-10-CM | POA: Diagnosis not present

## 2013-08-01 DIAGNOSIS — N186 End stage renal disease: Secondary | ICD-10-CM | POA: Diagnosis not present

## 2013-08-02 DIAGNOSIS — N186 End stage renal disease: Secondary | ICD-10-CM | POA: Diagnosis not present

## 2013-08-03 DIAGNOSIS — E441 Mild protein-calorie malnutrition: Secondary | ICD-10-CM | POA: Insufficient documentation

## 2013-08-03 DIAGNOSIS — D509 Iron deficiency anemia, unspecified: Secondary | ICD-10-CM | POA: Diagnosis not present

## 2013-08-03 DIAGNOSIS — N186 End stage renal disease: Secondary | ICD-10-CM | POA: Diagnosis not present

## 2013-08-03 DIAGNOSIS — N2581 Secondary hyperparathyroidism of renal origin: Secondary | ICD-10-CM | POA: Diagnosis not present

## 2013-08-03 DIAGNOSIS — D631 Anemia in chronic kidney disease: Secondary | ICD-10-CM | POA: Diagnosis not present

## 2013-08-03 DIAGNOSIS — E119 Type 2 diabetes mellitus without complications: Secondary | ICD-10-CM | POA: Diagnosis not present

## 2013-08-03 DIAGNOSIS — Z992 Dependence on renal dialysis: Secondary | ICD-10-CM | POA: Diagnosis not present

## 2013-08-11 ENCOUNTER — Encounter: Payer: Self-pay | Admitting: Cardiology

## 2013-08-30 DIAGNOSIS — M25519 Pain in unspecified shoulder: Secondary | ICD-10-CM | POA: Diagnosis not present

## 2013-09-02 DIAGNOSIS — N186 End stage renal disease: Secondary | ICD-10-CM | POA: Diagnosis not present

## 2013-09-05 DIAGNOSIS — E119 Type 2 diabetes mellitus without complications: Secondary | ICD-10-CM | POA: Diagnosis not present

## 2013-09-05 DIAGNOSIS — Z992 Dependence on renal dialysis: Secondary | ICD-10-CM | POA: Diagnosis not present

## 2013-09-05 DIAGNOSIS — N2581 Secondary hyperparathyroidism of renal origin: Secondary | ICD-10-CM | POA: Diagnosis not present

## 2013-09-05 DIAGNOSIS — D631 Anemia in chronic kidney disease: Secondary | ICD-10-CM | POA: Diagnosis not present

## 2013-09-05 DIAGNOSIS — N039 Chronic nephritic syndrome with unspecified morphologic changes: Secondary | ICD-10-CM | POA: Diagnosis not present

## 2013-09-05 DIAGNOSIS — D509 Iron deficiency anemia, unspecified: Secondary | ICD-10-CM | POA: Diagnosis not present

## 2013-09-05 DIAGNOSIS — N186 End stage renal disease: Secondary | ICD-10-CM | POA: Diagnosis not present

## 2013-09-07 DIAGNOSIS — E119 Type 2 diabetes mellitus without complications: Secondary | ICD-10-CM | POA: Diagnosis not present

## 2013-09-07 DIAGNOSIS — N186 End stage renal disease: Secondary | ICD-10-CM | POA: Diagnosis not present

## 2013-09-07 DIAGNOSIS — Z992 Dependence on renal dialysis: Secondary | ICD-10-CM | POA: Diagnosis not present

## 2013-09-07 DIAGNOSIS — D509 Iron deficiency anemia, unspecified: Secondary | ICD-10-CM | POA: Diagnosis not present

## 2013-09-07 DIAGNOSIS — N2581 Secondary hyperparathyroidism of renal origin: Secondary | ICD-10-CM | POA: Diagnosis not present

## 2013-09-07 DIAGNOSIS — D631 Anemia in chronic kidney disease: Secondary | ICD-10-CM | POA: Diagnosis not present

## 2013-09-09 DIAGNOSIS — E119 Type 2 diabetes mellitus without complications: Secondary | ICD-10-CM | POA: Diagnosis not present

## 2013-09-09 DIAGNOSIS — D509 Iron deficiency anemia, unspecified: Secondary | ICD-10-CM | POA: Diagnosis not present

## 2013-09-09 DIAGNOSIS — Z992 Dependence on renal dialysis: Secondary | ICD-10-CM | POA: Diagnosis not present

## 2013-09-09 DIAGNOSIS — N186 End stage renal disease: Secondary | ICD-10-CM | POA: Diagnosis not present

## 2013-09-09 DIAGNOSIS — D631 Anemia in chronic kidney disease: Secondary | ICD-10-CM | POA: Diagnosis not present

## 2013-09-09 DIAGNOSIS — N2581 Secondary hyperparathyroidism of renal origin: Secondary | ICD-10-CM | POA: Diagnosis not present

## 2013-09-12 DIAGNOSIS — Z992 Dependence on renal dialysis: Secondary | ICD-10-CM | POA: Diagnosis not present

## 2013-09-12 DIAGNOSIS — D631 Anemia in chronic kidney disease: Secondary | ICD-10-CM | POA: Diagnosis not present

## 2013-09-12 DIAGNOSIS — N039 Chronic nephritic syndrome with unspecified morphologic changes: Secondary | ICD-10-CM | POA: Diagnosis not present

## 2013-09-12 DIAGNOSIS — D509 Iron deficiency anemia, unspecified: Secondary | ICD-10-CM | POA: Diagnosis not present

## 2013-09-12 DIAGNOSIS — E119 Type 2 diabetes mellitus without complications: Secondary | ICD-10-CM | POA: Diagnosis not present

## 2013-09-12 DIAGNOSIS — N2581 Secondary hyperparathyroidism of renal origin: Secondary | ICD-10-CM | POA: Diagnosis not present

## 2013-09-12 DIAGNOSIS — N186 End stage renal disease: Secondary | ICD-10-CM | POA: Diagnosis not present

## 2013-09-14 DIAGNOSIS — E119 Type 2 diabetes mellitus without complications: Secondary | ICD-10-CM | POA: Diagnosis not present

## 2013-09-14 DIAGNOSIS — D631 Anemia in chronic kidney disease: Secondary | ICD-10-CM | POA: Diagnosis not present

## 2013-09-14 DIAGNOSIS — D509 Iron deficiency anemia, unspecified: Secondary | ICD-10-CM | POA: Diagnosis not present

## 2013-09-14 DIAGNOSIS — N186 End stage renal disease: Secondary | ICD-10-CM | POA: Diagnosis not present

## 2013-09-14 DIAGNOSIS — N039 Chronic nephritic syndrome with unspecified morphologic changes: Secondary | ICD-10-CM | POA: Diagnosis not present

## 2013-09-14 DIAGNOSIS — N2581 Secondary hyperparathyroidism of renal origin: Secondary | ICD-10-CM | POA: Diagnosis not present

## 2013-09-14 DIAGNOSIS — Z992 Dependence on renal dialysis: Secondary | ICD-10-CM | POA: Diagnosis not present

## 2013-09-16 DIAGNOSIS — N186 End stage renal disease: Secondary | ICD-10-CM | POA: Diagnosis not present

## 2013-09-16 DIAGNOSIS — D631 Anemia in chronic kidney disease: Secondary | ICD-10-CM | POA: Diagnosis not present

## 2013-09-16 DIAGNOSIS — Z992 Dependence on renal dialysis: Secondary | ICD-10-CM | POA: Diagnosis not present

## 2013-09-16 DIAGNOSIS — E119 Type 2 diabetes mellitus without complications: Secondary | ICD-10-CM | POA: Diagnosis not present

## 2013-09-16 DIAGNOSIS — N2581 Secondary hyperparathyroidism of renal origin: Secondary | ICD-10-CM | POA: Diagnosis not present

## 2013-09-16 DIAGNOSIS — D509 Iron deficiency anemia, unspecified: Secondary | ICD-10-CM | POA: Diagnosis not present

## 2013-09-19 DIAGNOSIS — E119 Type 2 diabetes mellitus without complications: Secondary | ICD-10-CM | POA: Diagnosis not present

## 2013-09-19 DIAGNOSIS — N039 Chronic nephritic syndrome with unspecified morphologic changes: Secondary | ICD-10-CM | POA: Diagnosis not present

## 2013-09-19 DIAGNOSIS — Z992 Dependence on renal dialysis: Secondary | ICD-10-CM | POA: Diagnosis not present

## 2013-09-19 DIAGNOSIS — D631 Anemia in chronic kidney disease: Secondary | ICD-10-CM | POA: Diagnosis not present

## 2013-09-19 DIAGNOSIS — N2581 Secondary hyperparathyroidism of renal origin: Secondary | ICD-10-CM | POA: Diagnosis not present

## 2013-09-19 DIAGNOSIS — N186 End stage renal disease: Secondary | ICD-10-CM | POA: Diagnosis not present

## 2013-09-19 DIAGNOSIS — D509 Iron deficiency anemia, unspecified: Secondary | ICD-10-CM | POA: Diagnosis not present

## 2013-09-21 DIAGNOSIS — N2581 Secondary hyperparathyroidism of renal origin: Secondary | ICD-10-CM | POA: Diagnosis not present

## 2013-09-21 DIAGNOSIS — Z992 Dependence on renal dialysis: Secondary | ICD-10-CM | POA: Diagnosis not present

## 2013-09-21 DIAGNOSIS — N186 End stage renal disease: Secondary | ICD-10-CM | POA: Diagnosis not present

## 2013-09-21 DIAGNOSIS — E119 Type 2 diabetes mellitus without complications: Secondary | ICD-10-CM | POA: Diagnosis not present

## 2013-09-21 DIAGNOSIS — D631 Anemia in chronic kidney disease: Secondary | ICD-10-CM | POA: Diagnosis not present

## 2013-09-21 DIAGNOSIS — D509 Iron deficiency anemia, unspecified: Secondary | ICD-10-CM | POA: Diagnosis not present

## 2013-09-23 DIAGNOSIS — Z992 Dependence on renal dialysis: Secondary | ICD-10-CM | POA: Diagnosis not present

## 2013-09-23 DIAGNOSIS — N2581 Secondary hyperparathyroidism of renal origin: Secondary | ICD-10-CM | POA: Diagnosis not present

## 2013-09-23 DIAGNOSIS — N186 End stage renal disease: Secondary | ICD-10-CM | POA: Diagnosis not present

## 2013-09-23 DIAGNOSIS — D631 Anemia in chronic kidney disease: Secondary | ICD-10-CM | POA: Diagnosis not present

## 2013-09-23 DIAGNOSIS — E119 Type 2 diabetes mellitus without complications: Secondary | ICD-10-CM | POA: Diagnosis not present

## 2013-09-23 DIAGNOSIS — D509 Iron deficiency anemia, unspecified: Secondary | ICD-10-CM | POA: Diagnosis not present

## 2013-09-26 DIAGNOSIS — E119 Type 2 diabetes mellitus without complications: Secondary | ICD-10-CM | POA: Diagnosis not present

## 2013-09-26 DIAGNOSIS — Z992 Dependence on renal dialysis: Secondary | ICD-10-CM | POA: Diagnosis not present

## 2013-09-26 DIAGNOSIS — N186 End stage renal disease: Secondary | ICD-10-CM | POA: Diagnosis not present

## 2013-09-26 DIAGNOSIS — D509 Iron deficiency anemia, unspecified: Secondary | ICD-10-CM | POA: Diagnosis not present

## 2013-09-26 DIAGNOSIS — D631 Anemia in chronic kidney disease: Secondary | ICD-10-CM | POA: Diagnosis not present

## 2013-09-26 DIAGNOSIS — N2581 Secondary hyperparathyroidism of renal origin: Secondary | ICD-10-CM | POA: Diagnosis not present

## 2013-09-27 ENCOUNTER — Ambulatory Visit: Payer: Medicare Other | Admitting: Cardiology

## 2013-09-28 DIAGNOSIS — N039 Chronic nephritic syndrome with unspecified morphologic changes: Secondary | ICD-10-CM | POA: Diagnosis not present

## 2013-09-28 DIAGNOSIS — D509 Iron deficiency anemia, unspecified: Secondary | ICD-10-CM | POA: Diagnosis not present

## 2013-09-28 DIAGNOSIS — N186 End stage renal disease: Secondary | ICD-10-CM | POA: Diagnosis not present

## 2013-09-28 DIAGNOSIS — N2581 Secondary hyperparathyroidism of renal origin: Secondary | ICD-10-CM | POA: Diagnosis not present

## 2013-09-28 DIAGNOSIS — Z992 Dependence on renal dialysis: Secondary | ICD-10-CM | POA: Diagnosis not present

## 2013-09-28 DIAGNOSIS — E119 Type 2 diabetes mellitus without complications: Secondary | ICD-10-CM | POA: Diagnosis not present

## 2013-09-28 DIAGNOSIS — D631 Anemia in chronic kidney disease: Secondary | ICD-10-CM | POA: Diagnosis not present

## 2013-09-29 ENCOUNTER — Encounter: Payer: Self-pay | Admitting: Cardiology

## 2013-09-29 ENCOUNTER — Ambulatory Visit (INDEPENDENT_AMBULATORY_CARE_PROVIDER_SITE_OTHER): Payer: Medicare Other | Admitting: Cardiology

## 2013-09-29 VITALS — BP 182/80 | HR 112 | Ht 66.0 in | Wt 156.0 lb

## 2013-09-29 DIAGNOSIS — I200 Unstable angina: Secondary | ICD-10-CM

## 2013-09-29 DIAGNOSIS — I160 Hypertensive urgency: Secondary | ICD-10-CM

## 2013-09-29 DIAGNOSIS — I10 Essential (primary) hypertension: Secondary | ICD-10-CM | POA: Diagnosis not present

## 2013-09-29 MED ORDER — METOPROLOL SUCCINATE ER 50 MG PO TB24
50.0000 mg | ORAL_TABLET | Freq: Every day | ORAL | Status: DC
Start: 2013-09-29 — End: 2013-11-03

## 2013-09-29 NOTE — Patient Instructions (Signed)
Your physician has recommended you make the following change in your medication:   INCREASE YOUR METOPROLOL TO 50 MG DAILY  PLEASE COME INTO OUR OFFICE IN 2 WEEKS FOR A NURSE VISIT- BP CHECK   Your physician recommends that you schedule a follow-up appointment in: IN [place]

## 2013-09-29 NOTE — Progress Notes (Signed)
Patient ID: James [facility], male   DOB: 1965/09/21, 48 y.o.   MRN: [id number]    Patient Name: James [facility] Date of Encounter: 09/29/2013  Primary Care Provider:  [name], MD Primary Cardiologist:  Dorothy Spark  Problem List   Past Medical History  Diagnosis Date  . ESRD on hemodialysis     Started HD Jan 2009.  ESRD was due to HTN.  Dx'd with HTN in hospital 1996 according to pt, they had to keep him so he could get Medicaid to afford the BP medications.  First saw a nephrologist and started HD in the same year 2009.  Gets HD at [facility] on a MWF schedule.  Does not have DM. He had a left RC AVF that never functioned, a left upper arm AVF that worked for about 5 years and as of June  . Hypertension   . COPD (chronic obstructive pulmonary disease)   . Arthritis     GOUT  . Anemia   . Secondary hyperparathyroidism   . Anxiety   . Depression   . Shortness of breath     With exertion  . GERD (gastroesophageal reflux disease)   . Sepsis 02/2013    from AVF , treated with Vancomycin.  . Renal insufficiency    Past Surgical History  Procedure Laterality Date  . Av fistula placement  2009    Left lower arm AVF  . Ligation of arteriovenous  fistula Left 12/22/2012    Procedure: LIGATION OF ARTERIOVENOUS  FISTULA;EXCISION OF LARGE ANEURYSMS;;  Surgeon: Elam Dutch, MD;  Location: [place];  Service: Vascular;  Laterality: Left;  . Insertion of dialysis catheter N/A 12/23/2012    Procedure: INSERTION OF DIALYSIS CATHETER; ULTRASOUND GUIDED;  Surgeon: Angelia Mould, MD;  Location: [place];  Service: Vascular;  Laterality: N/A;  . Av fistula placement Right 02/22/2013    Procedure:  CREATION  OF BRACHIAL CEPHALIC FISTULA RIGHT ARM;  Surgeon: Elam Dutch, MD;  [facility];  Service: Vascular;  Laterality: Right;  . Esophagogastroduodenoscopy (egd) with propofol N/A 04/12/2013    Procedure: ESOPHAGOGASTRODUODENOSCOPY (EGD) WITH PROPOFOL;   Surgeon: Arta Silence, MD;  Location: [facility];  Service: Endoscopy;  Laterality: N/A;   Allergies  Allergies  Allergen Reactions  . Aspirin     Tends to make him "feel worse" when he tries to take it for a cold or something    HPI  48 year old gentleman with prior medical history of hypertension that subsequently led to end-stage renal disease on renal hemodialysis for the last 6 years. The patient also has a diagnosis of liver cirrhosis of unknown etiology. The patient is coming with concerns of fatigue, shortness of breath, and weight loss. The patient states that he has lost significant amount of weight in the last 2 years. He is minimally active as he feels as he has no energy. And he gets short of breath with minimal activity. The patient denies any chest pain. He has also been experiencing palpitations associated with shortness of breath at the end of his dialysis. He was prescribed when necessary metoprolol that he takes toward the end of his hemodialysis session and helps with his symptoms. The patient denies syncope.  09/29/13 - the patient is feeling about the same, he is not complaint with his meds as some of the BP meds and muscle relaxant made him feel dizzy. Consequently he ended up in the ED with hypertensive urgency. I reviewed the list with  him.    Home Medications  Prior to Admission medications   Medication Sig Start Date End Date Taking? Authorizing Provider  ALPRAZolam Duanne Moron) 0.5 MG tablet Take 0.5-1 mg by mouth 3 (three) times daily as needed for anxiety.   Yes Historical Provider, MD  amLODipine (NORVASC) 10 MG tablet Take 10 mg by mouth at bedtime.    Yes Historical Provider, MD  cloNIDine (CATAPRES - DOSED IN MG/24 HR) 0.3 mg/24hr Place 1 patch onto the skin once a week. Saturday.   Yes Historical Provider, MD  esomeprazole (NEXIUM) 40 MG capsule Take 40-80 mg by mouth daily as needed (reflux/indigestion. Dose varies depending on severity.).    Yes Historical  Provider, MD  lisinopril (PRINIVIL,ZESTRIL) 40 MG tablet Take 40 mg by mouth at bedtime.    Yes Historical Provider, MD  metoprolol succinate (TOPROL-XL) 25 MG 24 hr tablet  05/13/13  Yes Historical Provider, MD  sevelamer carbonate (RENVELA) 800 MG tablet Take 2,400-5,600 mg by mouth 3 (three) times daily with meals. Takes 3-4 tablets if the meal is light and 6-7 tablets if he eats a big meal.   Yes     Family History  Family History  Problem Relation Age of Onset  . Cerebrovascular Accident Father   . Hypertension Father   . Hypertension Mother   . Congestive Heart Failure Brother   . Asthma Brother     Social History  History   Social History  . Marital Status: Single    Spouse Name: N/A    Number of Children: 0  . Years of Education: 12th   Occupational History  . n/a    Social History Main Topics  . Smoking status: Current Every Day Smoker -- 0.50 packs/day for 16 years    Types: Cigarettes  . Smokeless tobacco: Never Used  . Alcohol Use: Yes     Comment: occassional  . Drug Use: No  . Sexual Activity: Not on file   Other Topics Concern  . Not on file   Social History Narrative  . No narrative on file     Review of Systems, as per HPI, otherwise negative General:  No chills, fever, night sweats or weight changes.  Cardiovascular:  No chest pain, dyspnea on exertion, edema, orthopnea, palpitations, paroxysmal nocturnal dyspnea. Dermatological: No rash, lesions/masses Respiratory: No cough, dyspnea Urologic: No hematuria, dysuria Abdominal:   No nausea, vomiting, diarrhea, bright red blood per rectum, melena, or hematemesis Neurologic:  No visual changes, wkns, changes in mental status. All other systems reviewed and are otherwise negative except as noted above.  Physical Exam  Blood pressure 182/80, pulse 112, height 5' 6"  (1.676 m), weight 156 lb (70.761 kg).  General: Pleasant, NAD Psych: Normal affect. Neuro: Alert and oriented X 3. Moves all  extremities spontaneously. HEENT: Normal  Neck: Supple without bruits or JVD. Lungs:  Resp regular and unlabored, CTA. Heart: RRR no s3, s4, or murmurs. Abdomen: Soft, non-tender, non-distended, BS + x 4.  Extremities: No clubbing, cyanosis or edema. DP/PT/Radials 2+ and equal bilaterally. AV fistula on the right arm.  Labs:  No results found for this basename: CKTOTAL, CKMB, TROPONINI,  in the last 72 hours Lab Results  Component Value Date   WBC 9.3 07/14/2013   HGB 9.6* 07/14/2013   HCT 30.6* 07/14/2013   MCV 93.6 07/14/2013   PLT 282 07/14/2013       Component Value Date/Time   NA 138 07/14/2013 0633   K 4.7 07/14/2013 9528  CL 97 07/14/2013 0633   CO2 25 07/14/2013 0633   GLUCOSE 80 07/14/2013 0633   BUN 21 07/14/2013 0633   CREATININE 6.74* 07/14/2013 0633   CALCIUM 10.6* 07/14/2013 0633   CALCIUM 7.7* 02/18/2007 1522   PROT 7.0 07/09/2013 0300   ALBUMIN 2.5* 07/11/2013 0800   AST 9 07/09/2013 0300   ALT <5 07/09/2013 0300   ALKPHOS 61 07/09/2013 0300   BILITOT 0.8 07/09/2013 0300   GFRNONAA 9* 07/14/2013 0633   GFRAA 10* 07/14/2013 0633   Accessory Clinical Findings  Echocardiogram - 12/23/2012 Left ventricle: The cavity size was normal. Wall thickness was increased in a pattern of severe LVH. Systolic function was normal. The estimated ejection fraction was in the range of 60% to 65%. Wall motion was normal; there were no regional wall motion abnormalities. - Left atrium: The atrium was mildly dilated. - Pericardium, extracardiac: A trivial pericardial effusion was identified posterior to the heart.  ECG - 04/19/2013 SR, LAD, LAFB, negative T waves in the lateral leads   Assessment & Plan  A 48 year old male with history of hypertension and end-stage renal disease on hemodialysis  1. exertional dyspnea, fatigue - abnormal EKG with negative T waves in the lateral leads, Lexiscan nuclear stress test showed mid to apical anterior wall ischemia (SDS4). Cardiac cath showed no CAD  but elevated LVEDP. The echocardiogram in November of last year showed preserved LV function with severe LVH. No regional wall motion abnormalities.  The mainstay of therapy will be good BP control here.  2. Palpitations associated with shortness of breath - 48-hour Holter monitor showed frequent PVC, but no arrhythmias.   3. Hypertension - uncontrolled, we will increase Toprol XL to 50 mg po daily and set up a nurse visit in 2 weeks for BP check up. There is a lot of room for BP meds.  Followup with me in 3 months.  Dorothy Spark, MD, [facility] 09/29/2013, 4:43 PM

## 2013-09-30 DIAGNOSIS — N186 End stage renal disease: Secondary | ICD-10-CM | POA: Diagnosis not present

## 2013-09-30 DIAGNOSIS — E119 Type 2 diabetes mellitus without complications: Secondary | ICD-10-CM | POA: Diagnosis not present

## 2013-09-30 DIAGNOSIS — D631 Anemia in chronic kidney disease: Secondary | ICD-10-CM | POA: Diagnosis not present

## 2013-09-30 DIAGNOSIS — Z992 Dependence on renal dialysis: Secondary | ICD-10-CM | POA: Diagnosis not present

## 2013-09-30 DIAGNOSIS — N2581 Secondary hyperparathyroidism of renal origin: Secondary | ICD-10-CM | POA: Diagnosis not present

## 2013-09-30 DIAGNOSIS — D509 Iron deficiency anemia, unspecified: Secondary | ICD-10-CM | POA: Diagnosis not present

## 2013-09-30 DIAGNOSIS — N039 Chronic nephritic syndrome with unspecified morphologic changes: Secondary | ICD-10-CM | POA: Diagnosis not present

## 2013-10-03 DIAGNOSIS — D631 Anemia in chronic kidney disease: Secondary | ICD-10-CM | POA: Diagnosis not present

## 2013-10-03 DIAGNOSIS — N2581 Secondary hyperparathyroidism of renal origin: Secondary | ICD-10-CM | POA: Diagnosis not present

## 2013-10-03 DIAGNOSIS — N186 End stage renal disease: Secondary | ICD-10-CM | POA: Diagnosis not present

## 2013-10-03 DIAGNOSIS — Z992 Dependence on renal dialysis: Secondary | ICD-10-CM | POA: Diagnosis not present

## 2013-10-03 DIAGNOSIS — E119 Type 2 diabetes mellitus without complications: Secondary | ICD-10-CM | POA: Diagnosis not present

## 2013-10-03 DIAGNOSIS — D509 Iron deficiency anemia, unspecified: Secondary | ICD-10-CM | POA: Diagnosis not present

## 2013-10-05 DIAGNOSIS — D631 Anemia in chronic kidney disease: Secondary | ICD-10-CM | POA: Diagnosis not present

## 2013-10-05 DIAGNOSIS — E119 Type 2 diabetes mellitus without complications: Secondary | ICD-10-CM | POA: Diagnosis not present

## 2013-10-05 DIAGNOSIS — Z4931 Encounter for adequacy testing for hemodialysis: Secondary | ICD-10-CM | POA: Diagnosis not present

## 2013-10-05 DIAGNOSIS — Z992 Dependence on renal dialysis: Secondary | ICD-10-CM | POA: Diagnosis not present

## 2013-10-05 DIAGNOSIS — N2581 Secondary hyperparathyroidism of renal origin: Secondary | ICD-10-CM | POA: Diagnosis not present

## 2013-10-05 DIAGNOSIS — N186 End stage renal disease: Secondary | ICD-10-CM | POA: Diagnosis not present

## 2013-10-05 DIAGNOSIS — D509 Iron deficiency anemia, unspecified: Secondary | ICD-10-CM | POA: Diagnosis not present

## 2013-11-01 ENCOUNTER — Other Ambulatory Visit: Payer: Self-pay | Admitting: Gastroenterology

## 2013-11-01 DIAGNOSIS — K7469 Other cirrhosis of liver: Secondary | ICD-10-CM

## 2013-11-01 DIAGNOSIS — R188 Other ascites: Secondary | ICD-10-CM | POA: Diagnosis not present

## 2013-11-01 DIAGNOSIS — C220 Liver cell carcinoma: Secondary | ICD-10-CM

## 2013-11-01 DIAGNOSIS — K7460 Unspecified cirrhosis of liver: Secondary | ICD-10-CM | POA: Diagnosis not present

## 2013-11-01 DIAGNOSIS — R112 Nausea with vomiting, unspecified: Secondary | ICD-10-CM | POA: Diagnosis not present

## 2013-11-02 DIAGNOSIS — N186 End stage renal disease: Secondary | ICD-10-CM | POA: Diagnosis not present

## 2013-11-03 ENCOUNTER — Other Ambulatory Visit: Payer: Self-pay

## 2013-11-03 MED ORDER — METOPROLOL SUCCINATE ER 50 MG PO TB24: 50.0000 mg | ORAL_TABLET | Freq: Every day | ORAL | Status: DC

## 2013-11-04 DIAGNOSIS — N2581 Secondary hyperparathyroidism of renal origin: Secondary | ICD-10-CM | POA: Diagnosis not present

## 2013-11-04 DIAGNOSIS — Z4931 Encounter for adequacy testing for hemodialysis: Secondary | ICD-10-CM | POA: Diagnosis not present

## 2013-11-04 DIAGNOSIS — D631 Anemia in chronic kidney disease: Secondary | ICD-10-CM | POA: Diagnosis not present

## 2013-11-04 DIAGNOSIS — E119 Type 2 diabetes mellitus without complications: Secondary | ICD-10-CM | POA: Diagnosis not present

## 2013-11-04 DIAGNOSIS — D509 Iron deficiency anemia, unspecified: Secondary | ICD-10-CM | POA: Diagnosis not present

## 2013-11-04 DIAGNOSIS — N186 End stage renal disease: Secondary | ICD-10-CM | POA: Diagnosis not present

## 2013-11-10 ENCOUNTER — Other Ambulatory Visit: Payer: Medicare Other

## 2013-11-15 ENCOUNTER — Other Ambulatory Visit (HOSPITAL_COMMUNITY): Payer: Self-pay | Admitting: Respiratory Therapy

## 2013-11-15 DIAGNOSIS — R0600 Dyspnea, unspecified: Secondary | ICD-10-CM

## 2013-11-15 DIAGNOSIS — K7460 Unspecified cirrhosis of liver: Secondary | ICD-10-CM | POA: Diagnosis not present

## 2013-11-15 DIAGNOSIS — R0609 Other forms of dyspnea: Secondary | ICD-10-CM | POA: Diagnosis not present

## 2013-11-15 DIAGNOSIS — I10 Essential (primary) hypertension: Secondary | ICD-10-CM | POA: Diagnosis not present

## 2013-11-15 DIAGNOSIS — N400 Enlarged prostate without lower urinary tract symptoms: Secondary | ICD-10-CM | POA: Diagnosis not present

## 2013-11-15 DIAGNOSIS — I493 Ventricular premature depolarization: Secondary | ICD-10-CM | POA: Diagnosis not present

## 2013-11-15 DIAGNOSIS — I12 Hypertensive chronic kidney disease with 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: Secondary | ICD-10-CM | POA: Diagnosis not present

## 2013-11-15 DIAGNOSIS — N186 End stage renal disease: Secondary | ICD-10-CM | POA: Diagnosis not present

## 2013-11-22 ENCOUNTER — Encounter (HOSPITAL_COMMUNITY): Payer: Medicare Other

## 2013-11-29 ENCOUNTER — Ambulatory Visit
Admission: RE | Admit: 2013-11-29 | Discharge: 2013-11-29 | Disposition: A | Discharge status: Home / Self Care | Payer: Medicare Other | Source: Ambulatory Visit | Attending: Gastroenterology | Admitting: Gastroenterology

## 2013-11-29 DIAGNOSIS — K7469 Other cirrhosis of liver: Secondary | ICD-10-CM

## 2013-11-29 DIAGNOSIS — R188 Other ascites: Secondary | ICD-10-CM | POA: Diagnosis not present

## 2013-11-29 DIAGNOSIS — K7460 Unspecified cirrhosis of liver: Secondary | ICD-10-CM | POA: Diagnosis not present

## 2013-11-29 DIAGNOSIS — N261 Atrophy of kidney (terminal): Secondary | ICD-10-CM | POA: Diagnosis not present

## 2013-11-29 DIAGNOSIS — C220 Liver cell carcinoma: Secondary | ICD-10-CM

## 2013-12-03 DIAGNOSIS — N186 End stage renal disease: Secondary | ICD-10-CM | POA: Diagnosis not present

## 2013-12-03 DIAGNOSIS — Z992 Dependence on renal dialysis: Secondary | ICD-10-CM | POA: Diagnosis not present

## 2013-12-05 DIAGNOSIS — E119 Type 2 diabetes mellitus without complications: Secondary | ICD-10-CM | POA: Diagnosis not present

## 2013-12-05 DIAGNOSIS — N186 End stage renal disease: Secondary | ICD-10-CM | POA: Diagnosis not present

## 2013-12-05 DIAGNOSIS — D631 Anemia in chronic kidney disease: Secondary | ICD-10-CM | POA: Diagnosis not present

## 2013-12-05 DIAGNOSIS — N2581 Secondary hyperparathyroidism of renal origin: Secondary | ICD-10-CM | POA: Diagnosis not present

## 2013-12-05 DIAGNOSIS — Z4931 Encounter for adequacy testing for hemodialysis: Secondary | ICD-10-CM | POA: Diagnosis not present

## 2013-12-05 DIAGNOSIS — D509 Iron deficiency anemia, unspecified: Secondary | ICD-10-CM | POA: Diagnosis not present

## 2013-12-06 DIAGNOSIS — K449 Diaphragmatic hernia without obstruction or gangrene: Secondary | ICD-10-CM | POA: Diagnosis not present

## 2013-12-06 DIAGNOSIS — K2970 Gastritis, unspecified, without bleeding: Secondary | ICD-10-CM | POA: Diagnosis not present

## 2013-12-06 DIAGNOSIS — R112 Nausea with vomiting, unspecified: Secondary | ICD-10-CM | POA: Diagnosis not present

## 2013-12-07 DIAGNOSIS — N2581 Secondary hyperparathyroidism of renal origin: Secondary | ICD-10-CM | POA: Diagnosis not present

## 2013-12-07 DIAGNOSIS — Z4931 Encounter for adequacy testing for hemodialysis: Secondary | ICD-10-CM | POA: Diagnosis not present

## 2013-12-07 DIAGNOSIS — E119 Type 2 diabetes mellitus without complications: Secondary | ICD-10-CM | POA: Diagnosis not present

## 2013-12-07 DIAGNOSIS — N186 End stage renal disease: Secondary | ICD-10-CM | POA: Diagnosis not present

## 2013-12-07 DIAGNOSIS — D509 Iron deficiency anemia, unspecified: Secondary | ICD-10-CM | POA: Diagnosis not present

## 2013-12-07 DIAGNOSIS — D631 Anemia in chronic kidney disease: Secondary | ICD-10-CM | POA: Diagnosis not present

## 2013-12-09 DIAGNOSIS — N2581 Secondary hyperparathyroidism of renal origin: Secondary | ICD-10-CM | POA: Diagnosis not present

## 2013-12-09 DIAGNOSIS — D509 Iron deficiency anemia, unspecified: Secondary | ICD-10-CM | POA: Diagnosis not present

## 2013-12-09 DIAGNOSIS — Z4931 Encounter for adequacy testing for hemodialysis: Secondary | ICD-10-CM | POA: Diagnosis not present

## 2013-12-09 DIAGNOSIS — N186 End stage renal disease: Secondary | ICD-10-CM | POA: Diagnosis not present

## 2013-12-09 DIAGNOSIS — E119 Type 2 diabetes mellitus without complications: Secondary | ICD-10-CM | POA: Diagnosis not present

## 2013-12-09 DIAGNOSIS — D631 Anemia in chronic kidney disease: Secondary | ICD-10-CM | POA: Diagnosis not present

## 2013-12-12 DIAGNOSIS — N2581 Secondary hyperparathyroidism of renal origin: Secondary | ICD-10-CM | POA: Diagnosis not present

## 2013-12-12 DIAGNOSIS — D509 Iron deficiency anemia, unspecified: Secondary | ICD-10-CM | POA: Diagnosis not present

## 2013-12-12 DIAGNOSIS — E119 Type 2 diabetes mellitus without complications: Secondary | ICD-10-CM | POA: Diagnosis not present

## 2013-12-12 DIAGNOSIS — Z4931 Encounter for adequacy testing for hemodialysis: Secondary | ICD-10-CM | POA: Diagnosis not present

## 2013-12-12 DIAGNOSIS — D631 Anemia in chronic kidney disease: Secondary | ICD-10-CM | POA: Diagnosis not present

## 2013-12-12 DIAGNOSIS — N186 End stage renal disease: Secondary | ICD-10-CM | POA: Diagnosis not present

## 2013-12-13 ENCOUNTER — Ambulatory Visit (HOSPITAL_COMMUNITY)
Admission: RE | Admit: 2013-12-13 | Discharge: 2013-12-13 | Disposition: A | Discharge status: Home / Self Care | Payer: Medicare Other | Source: Ambulatory Visit | Attending: Internal Medicine | Admitting: Internal Medicine

## 2013-12-13 DIAGNOSIS — R0600 Dyspnea, unspecified: Secondary | ICD-10-CM | POA: Diagnosis not present

## 2013-12-13 LAB — PULMONARY FUNCTION TEST
DL/VA % pred: 67 %
DL/VA: 2.92 ml/min/mmHg/L
DLCO UNC: 14.52 ml/min/mmHg
DLCO cor % pred: 56 %
DLCO cor: 14.52 ml/min/mmHg
DLCO unc % pred: 56 %
FEF 25-75 PRE: 0.9 L/s
FEF2575-%PRED-PRE: 31 %
FEV1-%Pred-Pre: 58 %
FEV1-Pre: 1.63 L
FEV1FVC-%PRED-PRE: 55 %
FEV6-%Pred-Pre: 104 %
FEV6-Pre: 3.54 L
FEV6FVC-%PRED-PRE: 99 %
FVC-%PRED-PRE: 105 %
FVC-PRE: 3.66 L
PRE FEV6/FVC RATIO: 97 %
Pre FEV1/FVC ratio: 44 %

## 2013-12-14 DIAGNOSIS — N2581 Secondary hyperparathyroidism of renal origin: Secondary | ICD-10-CM | POA: Diagnosis not present

## 2013-12-14 DIAGNOSIS — D509 Iron deficiency anemia, unspecified: Secondary | ICD-10-CM | POA: Diagnosis not present

## 2013-12-14 DIAGNOSIS — Z4931 Encounter for adequacy testing for hemodialysis: Secondary | ICD-10-CM | POA: Diagnosis not present

## 2013-12-14 DIAGNOSIS — N186 End stage renal disease: Secondary | ICD-10-CM | POA: Diagnosis not present

## 2013-12-14 DIAGNOSIS — E119 Type 2 diabetes mellitus without complications: Secondary | ICD-10-CM | POA: Diagnosis not present

## 2013-12-14 DIAGNOSIS — D631 Anemia in chronic kidney disease: Secondary | ICD-10-CM | POA: Diagnosis not present

## 2013-12-16 DIAGNOSIS — N2581 Secondary hyperparathyroidism of renal origin: Secondary | ICD-10-CM | POA: Diagnosis not present

## 2013-12-16 DIAGNOSIS — D509 Iron deficiency anemia, unspecified: Secondary | ICD-10-CM | POA: Diagnosis not present

## 2013-12-16 DIAGNOSIS — Z4931 Encounter for adequacy testing for hemodialysis: Secondary | ICD-10-CM | POA: Diagnosis not present

## 2013-12-16 DIAGNOSIS — D631 Anemia in chronic kidney disease: Secondary | ICD-10-CM | POA: Diagnosis not present

## 2013-12-16 DIAGNOSIS — E119 Type 2 diabetes mellitus without complications: Secondary | ICD-10-CM | POA: Diagnosis not present

## 2013-12-16 DIAGNOSIS — N186 End stage renal disease: Secondary | ICD-10-CM | POA: Diagnosis not present

## 2013-12-19 DIAGNOSIS — D631 Anemia in chronic kidney disease: Secondary | ICD-10-CM | POA: Diagnosis not present

## 2013-12-19 DIAGNOSIS — D509 Iron deficiency anemia, unspecified: Secondary | ICD-10-CM | POA: Diagnosis not present

## 2013-12-19 DIAGNOSIS — E119 Type 2 diabetes mellitus without complications: Secondary | ICD-10-CM | POA: Diagnosis not present

## 2013-12-19 DIAGNOSIS — Z4931 Encounter for adequacy testing for hemodialysis: Secondary | ICD-10-CM | POA: Diagnosis not present

## 2013-12-19 DIAGNOSIS — N2581 Secondary hyperparathyroidism of renal origin: Secondary | ICD-10-CM | POA: Diagnosis not present

## 2013-12-19 DIAGNOSIS — N186 End stage renal disease: Secondary | ICD-10-CM | POA: Diagnosis not present

## 2013-12-21 DIAGNOSIS — Z4931 Encounter for adequacy testing for hemodialysis: Secondary | ICD-10-CM | POA: Diagnosis not present

## 2013-12-21 DIAGNOSIS — N186 End stage renal disease: Secondary | ICD-10-CM | POA: Diagnosis not present

## 2013-12-21 DIAGNOSIS — D631 Anemia in chronic kidney disease: Secondary | ICD-10-CM | POA: Diagnosis not present

## 2013-12-21 DIAGNOSIS — D509 Iron deficiency anemia, unspecified: Secondary | ICD-10-CM | POA: Diagnosis not present

## 2013-12-21 DIAGNOSIS — N2581 Secondary hyperparathyroidism of renal origin: Secondary | ICD-10-CM | POA: Diagnosis not present

## 2013-12-21 DIAGNOSIS — E119 Type 2 diabetes mellitus without complications: Secondary | ICD-10-CM | POA: Diagnosis not present

## 2013-12-23 DIAGNOSIS — D631 Anemia in chronic kidney disease: Secondary | ICD-10-CM | POA: Diagnosis not present

## 2013-12-23 DIAGNOSIS — E119 Type 2 diabetes mellitus without complications: Secondary | ICD-10-CM | POA: Diagnosis not present

## 2013-12-23 DIAGNOSIS — N186 End stage renal disease: Secondary | ICD-10-CM | POA: Diagnosis not present

## 2013-12-23 DIAGNOSIS — D509 Iron deficiency anemia, unspecified: Secondary | ICD-10-CM | POA: Diagnosis not present

## 2013-12-23 DIAGNOSIS — Z4931 Encounter for adequacy testing for hemodialysis: Secondary | ICD-10-CM | POA: Diagnosis not present

## 2013-12-23 DIAGNOSIS — N2581 Secondary hyperparathyroidism of renal origin: Secondary | ICD-10-CM | POA: Diagnosis not present

## 2013-12-25 DIAGNOSIS — D631 Anemia in chronic kidney disease: Secondary | ICD-10-CM | POA: Diagnosis not present

## 2013-12-25 DIAGNOSIS — N2581 Secondary hyperparathyroidism of renal origin: Secondary | ICD-10-CM | POA: Diagnosis not present

## 2013-12-25 DIAGNOSIS — D509 Iron deficiency anemia, unspecified: Secondary | ICD-10-CM | POA: Diagnosis not present

## 2013-12-25 DIAGNOSIS — N186 End stage renal disease: Secondary | ICD-10-CM | POA: Diagnosis not present

## 2013-12-25 DIAGNOSIS — E119 Type 2 diabetes mellitus without complications: Secondary | ICD-10-CM | POA: Diagnosis not present

## 2013-12-25 DIAGNOSIS — Z4931 Encounter for adequacy testing for hemodialysis: Secondary | ICD-10-CM | POA: Diagnosis not present

## 2013-12-27 DIAGNOSIS — N2581 Secondary hyperparathyroidism of renal origin: Secondary | ICD-10-CM | POA: Diagnosis not present

## 2013-12-27 DIAGNOSIS — D509 Iron deficiency anemia, unspecified: Secondary | ICD-10-CM | POA: Diagnosis not present

## 2013-12-27 DIAGNOSIS — N186 End stage renal disease: Secondary | ICD-10-CM | POA: Diagnosis not present

## 2013-12-27 DIAGNOSIS — D631 Anemia in chronic kidney disease: Secondary | ICD-10-CM | POA: Diagnosis not present

## 2013-12-27 DIAGNOSIS — Z4931 Encounter for adequacy testing for hemodialysis: Secondary | ICD-10-CM | POA: Diagnosis not present

## 2013-12-27 DIAGNOSIS — E119 Type 2 diabetes mellitus without complications: Secondary | ICD-10-CM | POA: Diagnosis not present

## 2013-12-30 DIAGNOSIS — D631 Anemia in chronic kidney disease: Secondary | ICD-10-CM | POA: Diagnosis not present

## 2013-12-30 DIAGNOSIS — E119 Type 2 diabetes mellitus without complications: Secondary | ICD-10-CM | POA: Diagnosis not present

## 2013-12-30 DIAGNOSIS — Z4931 Encounter for adequacy testing for hemodialysis: Secondary | ICD-10-CM | POA: Diagnosis not present

## 2013-12-30 DIAGNOSIS — D509 Iron deficiency anemia, unspecified: Secondary | ICD-10-CM | POA: Diagnosis not present

## 2013-12-30 DIAGNOSIS — N2581 Secondary hyperparathyroidism of renal origin: Secondary | ICD-10-CM | POA: Diagnosis not present

## 2013-12-30 DIAGNOSIS — N186 End stage renal disease: Secondary | ICD-10-CM | POA: Diagnosis not present

## 2014-01-02 DIAGNOSIS — D509 Iron deficiency anemia, unspecified: Secondary | ICD-10-CM | POA: Diagnosis not present

## 2014-01-02 DIAGNOSIS — N182 Chronic kidney disease, stage 2 (mild): Secondary | ICD-10-CM | POA: Diagnosis not present

## 2014-01-02 DIAGNOSIS — N2581 Secondary hyperparathyroidism of renal origin: Secondary | ICD-10-CM | POA: Diagnosis not present

## 2014-01-02 DIAGNOSIS — Z4931 Encounter for adequacy testing for hemodialysis: Secondary | ICD-10-CM | POA: Diagnosis not present

## 2014-01-02 DIAGNOSIS — D631 Anemia in chronic kidney disease: Secondary | ICD-10-CM | POA: Diagnosis not present

## 2014-01-02 DIAGNOSIS — E119 Type 2 diabetes mellitus without complications: Secondary | ICD-10-CM | POA: Diagnosis not present

## 2014-01-02 DIAGNOSIS — Z992 Dependence on renal dialysis: Secondary | ICD-10-CM | POA: Diagnosis not present

## 2014-01-02 DIAGNOSIS — N186 End stage renal disease: Secondary | ICD-10-CM | POA: Diagnosis not present

## 2014-01-03 ENCOUNTER — Ambulatory Visit: Payer: Medicare Other | Admitting: Cardiology

## 2014-01-04 DIAGNOSIS — N186 End stage renal disease: Secondary | ICD-10-CM | POA: Diagnosis not present

## 2014-01-04 DIAGNOSIS — E119 Type 2 diabetes mellitus without complications: Secondary | ICD-10-CM | POA: Diagnosis not present

## 2014-01-04 DIAGNOSIS — Z4931 Encounter for adequacy testing for hemodialysis: Secondary | ICD-10-CM | POA: Diagnosis not present

## 2014-01-04 DIAGNOSIS — D631 Anemia in chronic kidney disease: Secondary | ICD-10-CM | POA: Diagnosis not present

## 2014-01-04 DIAGNOSIS — D509 Iron deficiency anemia, unspecified: Secondary | ICD-10-CM | POA: Diagnosis not present

## 2014-01-04 DIAGNOSIS — N2581 Secondary hyperparathyroidism of renal origin: Secondary | ICD-10-CM | POA: Diagnosis not present

## 2014-01-05 DIAGNOSIS — Z4931 Encounter for adequacy testing for hemodialysis: Secondary | ICD-10-CM | POA: Insufficient documentation

## 2014-01-09 ENCOUNTER — Encounter: Payer: Self-pay | Admitting: Cardiology

## 2014-01-12 ENCOUNTER — Encounter (HOSPITAL_COMMUNITY): Payer: Self-pay | Admitting: Interventional Cardiology

## 2014-01-12 DIAGNOSIS — J069 Acute upper respiratory infection, unspecified: Secondary | ICD-10-CM | POA: Diagnosis not present

## 2014-01-18 ENCOUNTER — Encounter: Payer: Self-pay | Admitting: Cardiology

## 2014-02-01 DIAGNOSIS — D689 Coagulation defect, unspecified: Secondary | ICD-10-CM | POA: Insufficient documentation

## 2014-02-02 DIAGNOSIS — Z992 Dependence on renal dialysis: Secondary | ICD-10-CM | POA: Diagnosis not present

## 2014-02-02 DIAGNOSIS — N186 End stage renal disease: Secondary | ICD-10-CM | POA: Diagnosis not present

## 2014-02-04 DIAGNOSIS — Z4931 Encounter for adequacy testing for hemodialysis: Secondary | ICD-10-CM | POA: Diagnosis not present

## 2014-02-04 DIAGNOSIS — N2581 Secondary hyperparathyroidism of renal origin: Secondary | ICD-10-CM | POA: Diagnosis not present

## 2014-02-04 DIAGNOSIS — E119 Type 2 diabetes mellitus without complications: Secondary | ICD-10-CM | POA: Diagnosis not present

## 2014-02-04 DIAGNOSIS — D631 Anemia in chronic kidney disease: Secondary | ICD-10-CM | POA: Diagnosis not present

## 2014-02-04 DIAGNOSIS — D509 Iron deficiency anemia, unspecified: Secondary | ICD-10-CM | POA: Diagnosis not present

## 2014-02-04 DIAGNOSIS — N186 End stage renal disease: Secondary | ICD-10-CM | POA: Diagnosis not present

## 2014-02-06 DIAGNOSIS — N186 End stage renal disease: Secondary | ICD-10-CM | POA: Diagnosis not present

## 2014-02-06 DIAGNOSIS — N2581 Secondary hyperparathyroidism of renal origin: Secondary | ICD-10-CM | POA: Diagnosis not present

## 2014-02-06 DIAGNOSIS — Z4931 Encounter for adequacy testing for hemodialysis: Secondary | ICD-10-CM | POA: Diagnosis not present

## 2014-02-06 DIAGNOSIS — D509 Iron deficiency anemia, unspecified: Secondary | ICD-10-CM | POA: Diagnosis not present

## 2014-02-06 DIAGNOSIS — D631 Anemia in chronic kidney disease: Secondary | ICD-10-CM | POA: Diagnosis not present

## 2014-02-06 DIAGNOSIS — E119 Type 2 diabetes mellitus without complications: Secondary | ICD-10-CM | POA: Diagnosis not present

## 2014-02-08 ENCOUNTER — Encounter: Payer: Self-pay | Admitting: Cardiology

## 2014-02-08 DIAGNOSIS — Z4931 Encounter for adequacy testing for hemodialysis: Secondary | ICD-10-CM | POA: Diagnosis not present

## 2014-02-08 DIAGNOSIS — D509 Iron deficiency anemia, unspecified: Secondary | ICD-10-CM | POA: Diagnosis not present

## 2014-02-08 DIAGNOSIS — N2581 Secondary hyperparathyroidism of renal origin: Secondary | ICD-10-CM | POA: Diagnosis not present

## 2014-02-08 DIAGNOSIS — E119 Type 2 diabetes mellitus without complications: Secondary | ICD-10-CM | POA: Diagnosis not present

## 2014-02-08 DIAGNOSIS — N186 End stage renal disease: Secondary | ICD-10-CM | POA: Diagnosis not present

## 2014-02-08 DIAGNOSIS — D631 Anemia in chronic kidney disease: Secondary | ICD-10-CM | POA: Diagnosis not present

## 2014-02-10 DIAGNOSIS — Z4931 Encounter for adequacy testing for hemodialysis: Secondary | ICD-10-CM | POA: Diagnosis not present

## 2014-02-10 DIAGNOSIS — D631 Anemia in chronic kidney disease: Secondary | ICD-10-CM | POA: Diagnosis not present

## 2014-02-10 DIAGNOSIS — D509 Iron deficiency anemia, unspecified: Secondary | ICD-10-CM | POA: Diagnosis not present

## 2014-02-10 DIAGNOSIS — N2581 Secondary hyperparathyroidism of renal origin: Secondary | ICD-10-CM | POA: Diagnosis not present

## 2014-02-10 DIAGNOSIS — E119 Type 2 diabetes mellitus without complications: Secondary | ICD-10-CM | POA: Diagnosis not present

## 2014-02-10 DIAGNOSIS — N186 End stage renal disease: Secondary | ICD-10-CM | POA: Diagnosis not present

## 2014-02-13 DIAGNOSIS — D631 Anemia in chronic kidney disease: Secondary | ICD-10-CM | POA: Diagnosis not present

## 2014-02-13 DIAGNOSIS — N186 End stage renal disease: Secondary | ICD-10-CM | POA: Diagnosis not present

## 2014-02-13 DIAGNOSIS — E119 Type 2 diabetes mellitus without complications: Secondary | ICD-10-CM | POA: Diagnosis not present

## 2014-02-13 DIAGNOSIS — N2581 Secondary hyperparathyroidism of renal origin: Secondary | ICD-10-CM | POA: Diagnosis not present

## 2014-02-13 DIAGNOSIS — D509 Iron deficiency anemia, unspecified: Secondary | ICD-10-CM | POA: Diagnosis not present

## 2014-02-13 DIAGNOSIS — Z4931 Encounter for adequacy testing for hemodialysis: Secondary | ICD-10-CM | POA: Diagnosis not present

## 2014-02-15 DIAGNOSIS — N2581 Secondary hyperparathyroidism of renal origin: Secondary | ICD-10-CM | POA: Diagnosis not present

## 2014-02-15 DIAGNOSIS — E119 Type 2 diabetes mellitus without complications: Secondary | ICD-10-CM | POA: Diagnosis not present

## 2014-02-15 DIAGNOSIS — N186 End stage renal disease: Secondary | ICD-10-CM | POA: Diagnosis not present

## 2014-02-15 DIAGNOSIS — Z4931 Encounter for adequacy testing for hemodialysis: Secondary | ICD-10-CM | POA: Diagnosis not present

## 2014-02-15 DIAGNOSIS — D509 Iron deficiency anemia, unspecified: Secondary | ICD-10-CM | POA: Diagnosis not present

## 2014-02-15 DIAGNOSIS — D631 Anemia in chronic kidney disease: Secondary | ICD-10-CM | POA: Diagnosis not present

## 2014-02-17 DIAGNOSIS — N186 End stage renal disease: Secondary | ICD-10-CM | POA: Diagnosis not present

## 2014-02-17 DIAGNOSIS — D509 Iron deficiency anemia, unspecified: Secondary | ICD-10-CM | POA: Diagnosis not present

## 2014-02-17 DIAGNOSIS — D631 Anemia in chronic kidney disease: Secondary | ICD-10-CM | POA: Diagnosis not present

## 2014-02-17 DIAGNOSIS — Z4931 Encounter for adequacy testing for hemodialysis: Secondary | ICD-10-CM | POA: Diagnosis not present

## 2014-02-17 DIAGNOSIS — E119 Type 2 diabetes mellitus without complications: Secondary | ICD-10-CM | POA: Diagnosis not present

## 2014-02-17 DIAGNOSIS — N2581 Secondary hyperparathyroidism of renal origin: Secondary | ICD-10-CM | POA: Diagnosis not present

## 2014-02-20 DIAGNOSIS — N2581 Secondary hyperparathyroidism of renal origin: Secondary | ICD-10-CM | POA: Diagnosis not present

## 2014-02-20 DIAGNOSIS — D509 Iron deficiency anemia, unspecified: Secondary | ICD-10-CM | POA: Diagnosis not present

## 2014-02-20 DIAGNOSIS — N186 End stage renal disease: Secondary | ICD-10-CM | POA: Diagnosis not present

## 2014-02-20 DIAGNOSIS — E119 Type 2 diabetes mellitus without complications: Secondary | ICD-10-CM | POA: Diagnosis not present

## 2014-02-20 DIAGNOSIS — D631 Anemia in chronic kidney disease: Secondary | ICD-10-CM | POA: Diagnosis not present

## 2014-02-20 DIAGNOSIS — Z4931 Encounter for adequacy testing for hemodialysis: Secondary | ICD-10-CM | POA: Diagnosis not present

## 2014-02-22 DIAGNOSIS — N186 End stage renal disease: Secondary | ICD-10-CM | POA: Diagnosis not present

## 2014-02-22 DIAGNOSIS — D509 Iron deficiency anemia, unspecified: Secondary | ICD-10-CM | POA: Diagnosis not present

## 2014-02-22 DIAGNOSIS — N2581 Secondary hyperparathyroidism of renal origin: Secondary | ICD-10-CM | POA: Diagnosis not present

## 2014-02-22 DIAGNOSIS — D631 Anemia in chronic kidney disease: Secondary | ICD-10-CM | POA: Diagnosis not present

## 2014-02-22 DIAGNOSIS — Z4931 Encounter for adequacy testing for hemodialysis: Secondary | ICD-10-CM | POA: Diagnosis not present

## 2014-02-22 DIAGNOSIS — E119 Type 2 diabetes mellitus without complications: Secondary | ICD-10-CM | POA: Diagnosis not present

## 2014-02-24 DIAGNOSIS — Z4931 Encounter for adequacy testing for hemodialysis: Secondary | ICD-10-CM | POA: Diagnosis not present

## 2014-02-24 DIAGNOSIS — N186 End stage renal disease: Secondary | ICD-10-CM | POA: Diagnosis not present

## 2014-02-24 DIAGNOSIS — D631 Anemia in chronic kidney disease: Secondary | ICD-10-CM | POA: Diagnosis not present

## 2014-02-24 DIAGNOSIS — E119 Type 2 diabetes mellitus without complications: Secondary | ICD-10-CM | POA: Diagnosis not present

## 2014-02-24 DIAGNOSIS — D509 Iron deficiency anemia, unspecified: Secondary | ICD-10-CM | POA: Diagnosis not present

## 2014-02-24 DIAGNOSIS — N2581 Secondary hyperparathyroidism of renal origin: Secondary | ICD-10-CM | POA: Diagnosis not present

## 2014-02-27 DIAGNOSIS — N2581 Secondary hyperparathyroidism of renal origin: Secondary | ICD-10-CM | POA: Diagnosis not present

## 2014-02-27 DIAGNOSIS — Z4931 Encounter for adequacy testing for hemodialysis: Secondary | ICD-10-CM | POA: Diagnosis not present

## 2014-02-27 DIAGNOSIS — E119 Type 2 diabetes mellitus without complications: Secondary | ICD-10-CM | POA: Diagnosis not present

## 2014-02-27 DIAGNOSIS — D509 Iron deficiency anemia, unspecified: Secondary | ICD-10-CM | POA: Diagnosis not present

## 2014-02-27 DIAGNOSIS — D631 Anemia in chronic kidney disease: Secondary | ICD-10-CM | POA: Diagnosis not present

## 2014-02-27 DIAGNOSIS — N186 End stage renal disease: Secondary | ICD-10-CM | POA: Diagnosis not present

## 2014-03-01 DIAGNOSIS — N186 End stage renal disease: Secondary | ICD-10-CM | POA: Diagnosis not present

## 2014-03-01 DIAGNOSIS — Z4931 Encounter for adequacy testing for hemodialysis: Secondary | ICD-10-CM | POA: Diagnosis not present

## 2014-03-01 DIAGNOSIS — E119 Type 2 diabetes mellitus without complications: Secondary | ICD-10-CM | POA: Diagnosis not present

## 2014-03-01 DIAGNOSIS — D509 Iron deficiency anemia, unspecified: Secondary | ICD-10-CM | POA: Diagnosis not present

## 2014-03-01 DIAGNOSIS — D631 Anemia in chronic kidney disease: Secondary | ICD-10-CM | POA: Diagnosis not present

## 2014-03-01 DIAGNOSIS — N2581 Secondary hyperparathyroidism of renal origin: Secondary | ICD-10-CM | POA: Diagnosis not present

## 2014-03-03 DIAGNOSIS — D631 Anemia in chronic kidney disease: Secondary | ICD-10-CM | POA: Diagnosis not present

## 2014-03-03 DIAGNOSIS — N2581 Secondary hyperparathyroidism of renal origin: Secondary | ICD-10-CM | POA: Diagnosis not present

## 2014-03-03 DIAGNOSIS — E119 Type 2 diabetes mellitus without complications: Secondary | ICD-10-CM | POA: Diagnosis not present

## 2014-03-03 DIAGNOSIS — Z4931 Encounter for adequacy testing for hemodialysis: Secondary | ICD-10-CM | POA: Diagnosis not present

## 2014-03-03 DIAGNOSIS — D509 Iron deficiency anemia, unspecified: Secondary | ICD-10-CM | POA: Diagnosis not present

## 2014-03-03 DIAGNOSIS — N186 End stage renal disease: Secondary | ICD-10-CM | POA: Diagnosis not present

## 2014-03-05 DIAGNOSIS — Z992 Dependence on renal dialysis: Secondary | ICD-10-CM | POA: Diagnosis not present

## 2014-03-05 DIAGNOSIS — N186 End stage renal disease: Secondary | ICD-10-CM | POA: Diagnosis not present

## 2014-03-06 DIAGNOSIS — N186 End stage renal disease: Secondary | ICD-10-CM | POA: Diagnosis not present

## 2014-03-06 DIAGNOSIS — D631 Anemia in chronic kidney disease: Secondary | ICD-10-CM | POA: Diagnosis not present

## 2014-03-06 DIAGNOSIS — N2581 Secondary hyperparathyroidism of renal origin: Secondary | ICD-10-CM | POA: Diagnosis not present

## 2014-03-06 DIAGNOSIS — D509 Iron deficiency anemia, unspecified: Secondary | ICD-10-CM | POA: Diagnosis not present

## 2014-03-06 DIAGNOSIS — Z4931 Encounter for adequacy testing for hemodialysis: Secondary | ICD-10-CM | POA: Diagnosis not present

## 2014-03-08 DIAGNOSIS — N186 End stage renal disease: Secondary | ICD-10-CM | POA: Diagnosis not present

## 2014-03-08 DIAGNOSIS — Z4931 Encounter for adequacy testing for hemodialysis: Secondary | ICD-10-CM | POA: Diagnosis not present

## 2014-03-08 DIAGNOSIS — D631 Anemia in chronic kidney disease: Secondary | ICD-10-CM | POA: Diagnosis not present

## 2014-03-08 DIAGNOSIS — D509 Iron deficiency anemia, unspecified: Secondary | ICD-10-CM | POA: Diagnosis not present

## 2014-03-08 DIAGNOSIS — N2581 Secondary hyperparathyroidism of renal origin: Secondary | ICD-10-CM | POA: Diagnosis not present

## 2014-03-10 DIAGNOSIS — Z4931 Encounter for adequacy testing for hemodialysis: Secondary | ICD-10-CM | POA: Diagnosis not present

## 2014-03-10 DIAGNOSIS — D509 Iron deficiency anemia, unspecified: Secondary | ICD-10-CM | POA: Diagnosis not present

## 2014-03-10 DIAGNOSIS — N186 End stage renal disease: Secondary | ICD-10-CM | POA: Diagnosis not present

## 2014-03-10 DIAGNOSIS — N2581 Secondary hyperparathyroidism of renal origin: Secondary | ICD-10-CM | POA: Diagnosis not present

## 2014-03-10 DIAGNOSIS — D631 Anemia in chronic kidney disease: Secondary | ICD-10-CM | POA: Diagnosis not present

## 2014-03-13 DIAGNOSIS — N186 End stage renal disease: Secondary | ICD-10-CM | POA: Diagnosis not present

## 2014-03-13 DIAGNOSIS — Z4931 Encounter for adequacy testing for hemodialysis: Secondary | ICD-10-CM | POA: Diagnosis not present

## 2014-03-13 DIAGNOSIS — D509 Iron deficiency anemia, unspecified: Secondary | ICD-10-CM | POA: Diagnosis not present

## 2014-03-13 DIAGNOSIS — N2581 Secondary hyperparathyroidism of renal origin: Secondary | ICD-10-CM | POA: Diagnosis not present

## 2014-03-13 DIAGNOSIS — D631 Anemia in chronic kidney disease: Secondary | ICD-10-CM | POA: Diagnosis not present

## 2014-03-15 DIAGNOSIS — Z4931 Encounter for adequacy testing for hemodialysis: Secondary | ICD-10-CM | POA: Diagnosis not present

## 2014-03-15 DIAGNOSIS — D509 Iron deficiency anemia, unspecified: Secondary | ICD-10-CM | POA: Diagnosis not present

## 2014-03-15 DIAGNOSIS — D631 Anemia in chronic kidney disease: Secondary | ICD-10-CM | POA: Diagnosis not present

## 2014-03-15 DIAGNOSIS — N186 End stage renal disease: Secondary | ICD-10-CM | POA: Diagnosis not present

## 2014-03-15 DIAGNOSIS — N2581 Secondary hyperparathyroidism of renal origin: Secondary | ICD-10-CM | POA: Diagnosis not present

## 2014-03-17 DIAGNOSIS — Z4931 Encounter for adequacy testing for hemodialysis: Secondary | ICD-10-CM | POA: Diagnosis not present

## 2014-03-17 DIAGNOSIS — N186 End stage renal disease: Secondary | ICD-10-CM | POA: Diagnosis not present

## 2014-03-17 DIAGNOSIS — D631 Anemia in chronic kidney disease: Secondary | ICD-10-CM | POA: Diagnosis not present

## 2014-03-17 DIAGNOSIS — D509 Iron deficiency anemia, unspecified: Secondary | ICD-10-CM | POA: Diagnosis not present

## 2014-03-17 DIAGNOSIS — N2581 Secondary hyperparathyroidism of renal origin: Secondary | ICD-10-CM | POA: Diagnosis not present

## 2014-03-20 DIAGNOSIS — N186 End stage renal disease: Secondary | ICD-10-CM | POA: Diagnosis not present

## 2014-03-20 DIAGNOSIS — Z4931 Encounter for adequacy testing for hemodialysis: Secondary | ICD-10-CM | POA: Diagnosis not present

## 2014-03-20 DIAGNOSIS — D509 Iron deficiency anemia, unspecified: Secondary | ICD-10-CM | POA: Diagnosis not present

## 2014-03-20 DIAGNOSIS — D631 Anemia in chronic kidney disease: Secondary | ICD-10-CM | POA: Diagnosis not present

## 2014-03-20 DIAGNOSIS — N2581 Secondary hyperparathyroidism of renal origin: Secondary | ICD-10-CM | POA: Diagnosis not present

## 2014-03-22 DIAGNOSIS — D631 Anemia in chronic kidney disease: Secondary | ICD-10-CM | POA: Diagnosis not present

## 2014-03-22 DIAGNOSIS — Z4931 Encounter for adequacy testing for hemodialysis: Secondary | ICD-10-CM | POA: Diagnosis not present

## 2014-03-22 DIAGNOSIS — D509 Iron deficiency anemia, unspecified: Secondary | ICD-10-CM | POA: Diagnosis not present

## 2014-03-22 DIAGNOSIS — N2581 Secondary hyperparathyroidism of renal origin: Secondary | ICD-10-CM | POA: Diagnosis not present

## 2014-03-22 DIAGNOSIS — N186 End stage renal disease: Secondary | ICD-10-CM | POA: Diagnosis not present

## 2014-03-24 DIAGNOSIS — N2581 Secondary hyperparathyroidism of renal origin: Secondary | ICD-10-CM | POA: Diagnosis not present

## 2014-03-24 DIAGNOSIS — N186 End stage renal disease: Secondary | ICD-10-CM | POA: Diagnosis not present

## 2014-03-24 DIAGNOSIS — D509 Iron deficiency anemia, unspecified: Secondary | ICD-10-CM | POA: Diagnosis not present

## 2014-03-24 DIAGNOSIS — Z4931 Encounter for adequacy testing for hemodialysis: Secondary | ICD-10-CM | POA: Diagnosis not present

## 2014-03-24 DIAGNOSIS — D631 Anemia in chronic kidney disease: Secondary | ICD-10-CM | POA: Diagnosis not present

## 2014-03-27 DIAGNOSIS — N186 End stage renal disease: Secondary | ICD-10-CM | POA: Diagnosis not present

## 2014-03-27 DIAGNOSIS — D509 Iron deficiency anemia, unspecified: Secondary | ICD-10-CM | POA: Diagnosis not present

## 2014-03-27 DIAGNOSIS — N2581 Secondary hyperparathyroidism of renal origin: Secondary | ICD-10-CM | POA: Diagnosis not present

## 2014-03-27 DIAGNOSIS — Z4931 Encounter for adequacy testing for hemodialysis: Secondary | ICD-10-CM | POA: Diagnosis not present

## 2014-03-27 DIAGNOSIS — D631 Anemia in chronic kidney disease: Secondary | ICD-10-CM | POA: Diagnosis not present

## 2014-03-29 DIAGNOSIS — D631 Anemia in chronic kidney disease: Secondary | ICD-10-CM | POA: Diagnosis not present

## 2014-03-29 DIAGNOSIS — Z4931 Encounter for adequacy testing for hemodialysis: Secondary | ICD-10-CM | POA: Diagnosis not present

## 2014-03-29 DIAGNOSIS — N186 End stage renal disease: Secondary | ICD-10-CM | POA: Diagnosis not present

## 2014-03-29 DIAGNOSIS — D509 Iron deficiency anemia, unspecified: Secondary | ICD-10-CM | POA: Diagnosis not present

## 2014-03-29 DIAGNOSIS — N2581 Secondary hyperparathyroidism of renal origin: Secondary | ICD-10-CM | POA: Diagnosis not present

## 2014-03-31 DIAGNOSIS — N186 End stage renal disease: Secondary | ICD-10-CM | POA: Diagnosis not present

## 2014-03-31 DIAGNOSIS — D509 Iron deficiency anemia, unspecified: Secondary | ICD-10-CM | POA: Diagnosis not present

## 2014-03-31 DIAGNOSIS — D631 Anemia in chronic kidney disease: Secondary | ICD-10-CM | POA: Diagnosis not present

## 2014-03-31 DIAGNOSIS — N2581 Secondary hyperparathyroidism of renal origin: Secondary | ICD-10-CM | POA: Diagnosis not present

## 2014-03-31 DIAGNOSIS — Z4931 Encounter for adequacy testing for hemodialysis: Secondary | ICD-10-CM | POA: Diagnosis not present

## 2014-04-03 DIAGNOSIS — Z992 Dependence on renal dialysis: Secondary | ICD-10-CM | POA: Diagnosis not present

## 2014-04-03 DIAGNOSIS — N2581 Secondary hyperparathyroidism of renal origin: Secondary | ICD-10-CM | POA: Diagnosis not present

## 2014-04-03 DIAGNOSIS — Z4931 Encounter for adequacy testing for hemodialysis: Secondary | ICD-10-CM | POA: Diagnosis not present

## 2014-04-03 DIAGNOSIS — N186 End stage renal disease: Secondary | ICD-10-CM | POA: Diagnosis not present

## 2014-04-03 DIAGNOSIS — D631 Anemia in chronic kidney disease: Secondary | ICD-10-CM | POA: Diagnosis not present

## 2014-04-03 DIAGNOSIS — D509 Iron deficiency anemia, unspecified: Secondary | ICD-10-CM | POA: Diagnosis not present

## 2014-04-05 DIAGNOSIS — N186 End stage renal disease: Secondary | ICD-10-CM | POA: Diagnosis not present

## 2014-04-05 DIAGNOSIS — D509 Iron deficiency anemia, unspecified: Secondary | ICD-10-CM | POA: Diagnosis not present

## 2014-04-05 DIAGNOSIS — D631 Anemia in chronic kidney disease: Secondary | ICD-10-CM | POA: Diagnosis not present

## 2014-04-05 DIAGNOSIS — Z4931 Encounter for adequacy testing for hemodialysis: Secondary | ICD-10-CM | POA: Diagnosis not present

## 2014-04-05 DIAGNOSIS — E119 Type 2 diabetes mellitus without complications: Secondary | ICD-10-CM | POA: Diagnosis not present

## 2014-04-05 DIAGNOSIS — N2581 Secondary hyperparathyroidism of renal origin: Secondary | ICD-10-CM | POA: Diagnosis not present

## 2014-04-07 DIAGNOSIS — D509 Iron deficiency anemia, unspecified: Secondary | ICD-10-CM | POA: Diagnosis not present

## 2014-04-07 DIAGNOSIS — E119 Type 2 diabetes mellitus without complications: Secondary | ICD-10-CM | POA: Diagnosis not present

## 2014-04-07 DIAGNOSIS — N186 End stage renal disease: Secondary | ICD-10-CM | POA: Diagnosis not present

## 2014-04-07 DIAGNOSIS — N2581 Secondary hyperparathyroidism of renal origin: Secondary | ICD-10-CM | POA: Diagnosis not present

## 2014-04-07 DIAGNOSIS — D631 Anemia in chronic kidney disease: Secondary | ICD-10-CM | POA: Diagnosis not present

## 2014-04-07 DIAGNOSIS — Z4931 Encounter for adequacy testing for hemodialysis: Secondary | ICD-10-CM | POA: Diagnosis not present

## 2014-04-10 DIAGNOSIS — N2581 Secondary hyperparathyroidism of renal origin: Secondary | ICD-10-CM | POA: Diagnosis not present

## 2014-04-10 DIAGNOSIS — E119 Type 2 diabetes mellitus without complications: Secondary | ICD-10-CM | POA: Diagnosis not present

## 2014-04-10 DIAGNOSIS — D509 Iron deficiency anemia, unspecified: Secondary | ICD-10-CM | POA: Diagnosis not present

## 2014-04-10 DIAGNOSIS — N186 End stage renal disease: Secondary | ICD-10-CM | POA: Diagnosis not present

## 2014-04-10 DIAGNOSIS — Z4931 Encounter for adequacy testing for hemodialysis: Secondary | ICD-10-CM | POA: Diagnosis not present

## 2014-04-10 DIAGNOSIS — D631 Anemia in chronic kidney disease: Secondary | ICD-10-CM | POA: Diagnosis not present

## 2014-04-12 DIAGNOSIS — N2581 Secondary hyperparathyroidism of renal origin: Secondary | ICD-10-CM | POA: Diagnosis not present

## 2014-04-12 DIAGNOSIS — D509 Iron deficiency anemia, unspecified: Secondary | ICD-10-CM | POA: Diagnosis not present

## 2014-04-12 DIAGNOSIS — N186 End stage renal disease: Secondary | ICD-10-CM | POA: Diagnosis not present

## 2014-04-12 DIAGNOSIS — Z4931 Encounter for adequacy testing for hemodialysis: Secondary | ICD-10-CM | POA: Diagnosis not present

## 2014-04-12 DIAGNOSIS — E119 Type 2 diabetes mellitus without complications: Secondary | ICD-10-CM | POA: Diagnosis not present

## 2014-04-12 DIAGNOSIS — D631 Anemia in chronic kidney disease: Secondary | ICD-10-CM | POA: Diagnosis not present

## 2014-04-14 DIAGNOSIS — D631 Anemia in chronic kidney disease: Secondary | ICD-10-CM | POA: Diagnosis not present

## 2014-04-14 DIAGNOSIS — D509 Iron deficiency anemia, unspecified: Secondary | ICD-10-CM | POA: Diagnosis not present

## 2014-04-14 DIAGNOSIS — E119 Type 2 diabetes mellitus without complications: Secondary | ICD-10-CM | POA: Diagnosis not present

## 2014-04-14 DIAGNOSIS — N186 End stage renal disease: Secondary | ICD-10-CM | POA: Diagnosis not present

## 2014-04-14 DIAGNOSIS — N2581 Secondary hyperparathyroidism of renal origin: Secondary | ICD-10-CM | POA: Diagnosis not present

## 2014-04-14 DIAGNOSIS — Z4931 Encounter for adequacy testing for hemodialysis: Secondary | ICD-10-CM | POA: Diagnosis not present

## 2014-04-17 DIAGNOSIS — N186 End stage renal disease: Secondary | ICD-10-CM | POA: Diagnosis not present

## 2014-04-17 DIAGNOSIS — N2581 Secondary hyperparathyroidism of renal origin: Secondary | ICD-10-CM | POA: Diagnosis not present

## 2014-04-17 DIAGNOSIS — D631 Anemia in chronic kidney disease: Secondary | ICD-10-CM | POA: Diagnosis not present

## 2014-04-17 DIAGNOSIS — Z4931 Encounter for adequacy testing for hemodialysis: Secondary | ICD-10-CM | POA: Diagnosis not present

## 2014-04-17 DIAGNOSIS — D509 Iron deficiency anemia, unspecified: Secondary | ICD-10-CM | POA: Diagnosis not present

## 2014-04-17 DIAGNOSIS — E119 Type 2 diabetes mellitus without complications: Secondary | ICD-10-CM | POA: Diagnosis not present

## 2014-04-19 DIAGNOSIS — D509 Iron deficiency anemia, unspecified: Secondary | ICD-10-CM | POA: Diagnosis not present

## 2014-04-19 DIAGNOSIS — D631 Anemia in chronic kidney disease: Secondary | ICD-10-CM | POA: Diagnosis not present

## 2014-04-19 DIAGNOSIS — E119 Type 2 diabetes mellitus without complications: Secondary | ICD-10-CM | POA: Diagnosis not present

## 2014-04-19 DIAGNOSIS — Z4931 Encounter for adequacy testing for hemodialysis: Secondary | ICD-10-CM | POA: Diagnosis not present

## 2014-04-19 DIAGNOSIS — N186 End stage renal disease: Secondary | ICD-10-CM | POA: Diagnosis not present

## 2014-04-19 DIAGNOSIS — N2581 Secondary hyperparathyroidism of renal origin: Secondary | ICD-10-CM | POA: Diagnosis not present

## 2014-04-21 DIAGNOSIS — D509 Iron deficiency anemia, unspecified: Secondary | ICD-10-CM | POA: Diagnosis not present

## 2014-04-21 DIAGNOSIS — Z4931 Encounter for adequacy testing for hemodialysis: Secondary | ICD-10-CM | POA: Diagnosis not present

## 2014-04-21 DIAGNOSIS — E119 Type 2 diabetes mellitus without complications: Secondary | ICD-10-CM | POA: Diagnosis not present

## 2014-04-21 DIAGNOSIS — N2581 Secondary hyperparathyroidism of renal origin: Secondary | ICD-10-CM | POA: Diagnosis not present

## 2014-04-21 DIAGNOSIS — D631 Anemia in chronic kidney disease: Secondary | ICD-10-CM | POA: Diagnosis not present

## 2014-04-21 DIAGNOSIS — N186 End stage renal disease: Secondary | ICD-10-CM | POA: Diagnosis not present

## 2014-04-24 DIAGNOSIS — Z4931 Encounter for adequacy testing for hemodialysis: Secondary | ICD-10-CM | POA: Diagnosis not present

## 2014-04-24 DIAGNOSIS — N186 End stage renal disease: Secondary | ICD-10-CM | POA: Diagnosis not present

## 2014-04-24 DIAGNOSIS — D509 Iron deficiency anemia, unspecified: Secondary | ICD-10-CM | POA: Diagnosis not present

## 2014-04-24 DIAGNOSIS — N2581 Secondary hyperparathyroidism of renal origin: Secondary | ICD-10-CM | POA: Diagnosis not present

## 2014-04-24 DIAGNOSIS — D631 Anemia in chronic kidney disease: Secondary | ICD-10-CM | POA: Diagnosis not present

## 2014-04-24 DIAGNOSIS — E119 Type 2 diabetes mellitus without complications: Secondary | ICD-10-CM | POA: Diagnosis not present

## 2014-04-26 DIAGNOSIS — D509 Iron deficiency anemia, unspecified: Secondary | ICD-10-CM | POA: Diagnosis not present

## 2014-04-26 DIAGNOSIS — Z4931 Encounter for adequacy testing for hemodialysis: Secondary | ICD-10-CM | POA: Diagnosis not present

## 2014-04-26 DIAGNOSIS — D631 Anemia in chronic kidney disease: Secondary | ICD-10-CM | POA: Diagnosis not present

## 2014-04-26 DIAGNOSIS — N186 End stage renal disease: Secondary | ICD-10-CM | POA: Diagnosis not present

## 2014-04-26 DIAGNOSIS — N2581 Secondary hyperparathyroidism of renal origin: Secondary | ICD-10-CM | POA: Diagnosis not present

## 2014-04-26 DIAGNOSIS — E119 Type 2 diabetes mellitus without complications: Secondary | ICD-10-CM | POA: Diagnosis not present

## 2014-04-28 DIAGNOSIS — E119 Type 2 diabetes mellitus without complications: Secondary | ICD-10-CM | POA: Diagnosis not present

## 2014-04-28 DIAGNOSIS — D631 Anemia in chronic kidney disease: Secondary | ICD-10-CM | POA: Diagnosis not present

## 2014-04-28 DIAGNOSIS — N2581 Secondary hyperparathyroidism of renal origin: Secondary | ICD-10-CM | POA: Diagnosis not present

## 2014-04-28 DIAGNOSIS — N186 End stage renal disease: Secondary | ICD-10-CM | POA: Diagnosis not present

## 2014-04-28 DIAGNOSIS — D509 Iron deficiency anemia, unspecified: Secondary | ICD-10-CM | POA: Diagnosis not present

## 2014-04-28 DIAGNOSIS — Z4931 Encounter for adequacy testing for hemodialysis: Secondary | ICD-10-CM | POA: Diagnosis not present

## 2014-05-01 DIAGNOSIS — D631 Anemia in chronic kidney disease: Secondary | ICD-10-CM | POA: Diagnosis not present

## 2014-05-01 DIAGNOSIS — N186 End stage renal disease: Secondary | ICD-10-CM | POA: Diagnosis not present

## 2014-05-01 DIAGNOSIS — D509 Iron deficiency anemia, unspecified: Secondary | ICD-10-CM | POA: Diagnosis not present

## 2014-05-01 DIAGNOSIS — E119 Type 2 diabetes mellitus without complications: Secondary | ICD-10-CM | POA: Diagnosis not present

## 2014-05-01 DIAGNOSIS — Z4931 Encounter for adequacy testing for hemodialysis: Secondary | ICD-10-CM | POA: Diagnosis not present

## 2014-05-01 DIAGNOSIS — N2581 Secondary hyperparathyroidism of renal origin: Secondary | ICD-10-CM | POA: Diagnosis not present

## 2014-05-03 DIAGNOSIS — D631 Anemia in chronic kidney disease: Secondary | ICD-10-CM | POA: Diagnosis not present

## 2014-05-03 DIAGNOSIS — N2581 Secondary hyperparathyroidism of renal origin: Secondary | ICD-10-CM | POA: Diagnosis not present

## 2014-05-03 DIAGNOSIS — D509 Iron deficiency anemia, unspecified: Secondary | ICD-10-CM | POA: Diagnosis not present

## 2014-05-03 DIAGNOSIS — Z4931 Encounter for adequacy testing for hemodialysis: Secondary | ICD-10-CM | POA: Diagnosis not present

## 2014-05-03 DIAGNOSIS — E119 Type 2 diabetes mellitus without complications: Secondary | ICD-10-CM | POA: Diagnosis not present

## 2014-05-03 DIAGNOSIS — N186 End stage renal disease: Secondary | ICD-10-CM | POA: Diagnosis not present

## 2014-05-04 DIAGNOSIS — Z992 Dependence on renal dialysis: Secondary | ICD-10-CM | POA: Diagnosis not present

## 2014-05-04 DIAGNOSIS — I12 Hypertensive chronic kidney disease with 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: Secondary | ICD-10-CM | POA: Diagnosis not present

## 2014-05-04 DIAGNOSIS — N186 End stage renal disease: Secondary | ICD-10-CM | POA: Diagnosis not present

## 2014-05-05 DIAGNOSIS — D509 Iron deficiency anemia, unspecified: Secondary | ICD-10-CM | POA: Diagnosis not present

## 2014-05-05 DIAGNOSIS — Z4931 Encounter for adequacy testing for hemodialysis: Secondary | ICD-10-CM | POA: Diagnosis not present

## 2014-05-05 DIAGNOSIS — N186 End stage renal disease: Secondary | ICD-10-CM | POA: Diagnosis not present

## 2014-05-05 DIAGNOSIS — D631 Anemia in chronic kidney disease: Secondary | ICD-10-CM | POA: Diagnosis not present

## 2014-05-05 DIAGNOSIS — N2581 Secondary hyperparathyroidism of renal origin: Secondary | ICD-10-CM | POA: Diagnosis not present

## 2014-05-05 DIAGNOSIS — E119 Type 2 diabetes mellitus without complications: Secondary | ICD-10-CM | POA: Diagnosis not present

## 2014-05-06 ENCOUNTER — Emergency Department (HOSPITAL_COMMUNITY)
Admission: EM | Admit: 2014-05-06 | Discharge: 2014-05-06 | Disposition: A | Discharge status: Home / Self Care | Payer: Medicare Other | Attending: Emergency Medicine | Admitting: Emergency Medicine

## 2014-05-06 ENCOUNTER — Emergency Department (HOSPITAL_COMMUNITY): Payer: Medicare Other

## 2014-05-06 ENCOUNTER — Encounter (HOSPITAL_COMMUNITY): Payer: Self-pay | Admitting: Emergency Medicine

## 2014-05-06 DIAGNOSIS — Z8619 Personal history of other infectious and parasitic diseases: Secondary | ICD-10-CM | POA: Insufficient documentation

## 2014-05-06 DIAGNOSIS — Z8739 Personal history of other diseases of the musculoskeletal system and connective tissue: Secondary | ICD-10-CM | POA: Insufficient documentation

## 2014-05-06 DIAGNOSIS — M79671 Pain in right foot: Secondary | ICD-10-CM | POA: Diagnosis not present

## 2014-05-06 DIAGNOSIS — I12 Hypertensive chronic kidney disease with 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: Secondary | ICD-10-CM | POA: Diagnosis not present

## 2014-05-06 DIAGNOSIS — J449 Chronic obstructive pulmonary disease, unspecified: Secondary | ICD-10-CM | POA: Insufficient documentation

## 2014-05-06 DIAGNOSIS — M7989 Other specified soft tissue disorders: Secondary | ICD-10-CM | POA: Diagnosis not present

## 2014-05-06 DIAGNOSIS — F329 Major depressive disorder, single episode, unspecified: Secondary | ICD-10-CM | POA: Insufficient documentation

## 2014-05-06 DIAGNOSIS — Z862 Personal history of diseases of the blood and blood-forming organs and certain disorders involving the immune mechanism: Secondary | ICD-10-CM | POA: Insufficient documentation

## 2014-05-06 DIAGNOSIS — Y9389 Activity, other specified: Secondary | ICD-10-CM | POA: Insufficient documentation

## 2014-05-06 DIAGNOSIS — K219 Gastro-esophageal reflux disease without esophagitis: Secondary | ICD-10-CM | POA: Insufficient documentation

## 2014-05-06 DIAGNOSIS — Z79899 Other long term (current) drug therapy: Secondary | ICD-10-CM | POA: Diagnosis not present

## 2014-05-06 DIAGNOSIS — Z992 Dependence on renal dialysis: Secondary | ICD-10-CM | POA: Insufficient documentation

## 2014-05-06 DIAGNOSIS — S99921A Unspecified injury of right foot, initial encounter: Secondary | ICD-10-CM | POA: Insufficient documentation

## 2014-05-06 DIAGNOSIS — Z8639 Personal history of other endocrine, nutritional and metabolic disease: Secondary | ICD-10-CM | POA: Insufficient documentation

## 2014-05-06 DIAGNOSIS — Y998 Other external cause status: Secondary | ICD-10-CM | POA: Insufficient documentation

## 2014-05-06 DIAGNOSIS — N186 End stage renal disease: Secondary | ICD-10-CM | POA: Insufficient documentation

## 2014-05-06 DIAGNOSIS — F419 Anxiety disorder, unspecified: Secondary | ICD-10-CM | POA: Diagnosis not present

## 2014-05-06 DIAGNOSIS — W228XXA Striking against or struck by other objects, initial encounter: Secondary | ICD-10-CM | POA: Insufficient documentation

## 2014-05-06 DIAGNOSIS — Z720 Tobacco use: Secondary | ICD-10-CM | POA: Insufficient documentation

## 2014-05-06 DIAGNOSIS — Y929 Unspecified place or not applicable: Secondary | ICD-10-CM | POA: Insufficient documentation

## 2014-05-06 MED ORDER — TRAMADOL HCL 50 MG PO TABS
50.0000 mg | ORAL_TABLET | Freq: Once | ORAL | Status: AC
  Administered 2014-05-06: 50 mg via ORAL
  Filled 2014-05-06: qty 1

## 2014-05-06 NOTE — Discharge Instructions (Signed)
°  Read the information below.  You may return to the Emergency Department at any time for worsening condition or any new symptoms that concern you.  Take tylenol as needed for pain.  Follow the RICE instructions provided.

## 2014-05-06 NOTE — ED Notes (Signed)
Declined W/C at D/C and was escorted to lobby by RN. 

## 2014-05-06 NOTE — ED Notes (Signed)
Patient with right great toe pain after working in the yard yesterday.  Patient states that it hurts when he puts pressure on his right leg.  He states that the right great toe is swollen.

## 2014-05-06 NOTE — ED Provider Notes (Signed)
CSN: [id number]     Arrival date & time 05/06/14  0554 History   First MD Initiated Contact with Patient 05/06/14 [phone]     Chief Complaint  Patient presents with  . Toe Injury     (Consider location/radiation/quality/duration/timing/severity/associated sxs/prior Treatment) The history is provided by the patient.   Pt presents with right great toe pain after accidentally stubbing toe on a brick yesterday.  States he was bending over trying to reset a rail in his flower bed, the rail shifted and knocked him off balance, causing him to step and stub his right great toe into a brick.  Has had throbbing pain since it happened yesterday, worse with pressure and attempting to stand.  Has used tylenol without relief.  Denies fevers, weakness/numbness of the toe, other injury, other joint pain, leg swelling.    Past Medical History  Diagnosis Date  . ESRD on hemodialysis     Started HD Jan 2009.  ESRD was due to HTN.  Dx'd with HTN in hospital 1996 according to pt, they had to keep him so he could get Medicaid to afford the BP medications.  First saw a nephrologist and started HD in the same year 2009.  Gets HD at [facility] on a MWF schedule.  Does not have DM. He had a left RC AVF that never functioned, a left upper arm AVF that worked for about 5 years and as of June  . Hypertension   . COPD (chronic obstructive pulmonary disease)   . Arthritis     GOUT  . Anemia   . Secondary hyperparathyroidism   . Anxiety   . Depression   . Shortness of breath     With exertion  . GERD (gastroesophageal reflux disease)   . Sepsis 02/2013    from AVF , treated with Vancomycin.  . Renal insufficiency    Past Surgical History  Procedure Laterality Date  . Av fistula placement  2009    Left lower arm AVF  . Ligation of arteriovenous  fistula Left 12/22/2012    Procedure: LIGATION OF ARTERIOVENOUS  FISTULA;EXCISION OF LARGE ANEURYSMS;;  Surgeon: Elam Dutch, MD;  Location: [place];  Service: Vascular;   Laterality: Left;  . Insertion of dialysis catheter N/A 12/23/2012    Procedure: INSERTION OF DIALYSIS CATHETER; ULTRASOUND GUIDED;  Surgeon: Angelia Mould, MD;  Location: [place];  Service: Vascular;  Laterality: N/A;  . Av fistula placement Right 02/22/2013    Procedure:  CREATION  OF BRACHIAL CEPHALIC FISTULA RIGHT ARM;  Surgeon: Elam Dutch, MD;  Location: [place];  Service: Vascular;  Laterality: Right;  . Esophagogastroduodenoscopy (egd) with propofol N/A 04/12/2013    Procedure: ESOPHAGOGASTRODUODENOSCOPY (EGD) WITH PROPOFOL;  Surgeon: Arta Silence, MD;  Location: [facility];  Service: Endoscopy;  Laterality: N/A;  . Left heart catheterization with coronary angiogram N/A 07/13/2013    Procedure: LEFT HEART CATHETERIZATION WITH CORONARY ANGIOGRAM;  Surgeon: Jettie Booze, MD;  Location: [facility] CATH LAB;  Service: Cardiovascular;  Laterality: N/A;   Family History  Problem Relation Age of Onset  . Cerebrovascular Accident Father   . Hypertension Father   . Hypertension Mother   . Congestive Heart Failure Brother   . Asthma Brother    History  Substance Use Topics  . Smoking status: Current Every Day Smoker -- 0.50 packs/day for 16 years    Types: Cigarettes  . Smokeless tobacco: Never Used  . Alcohol Use: Yes     Comment: occassional  Review of Systems  Constitutional: Negative for fever and chills.  Cardiovascular: Negative for leg swelling.  Musculoskeletal: Positive for arthralgias.  Skin: Negative for color change and wound.  Allergic/Immunologic: Positive for immunocompromised state.  Neurological: Negative for weakness and numbness.  Psychiatric/Behavioral: Negative for self-injury (accidental).      Allergies  Aspirin  Home Medications   Prior to Admission medications   Medication Sig Start Date End Date Taking? Authorizing Provider  ALPRAZolam Duanne Moron) 1 MG tablet Take 1 mg by mouth at bedtime as needed for anxiety.    Historical Provider, MD    amLODipine (NORVASC) 10 MG tablet Take 10 mg by mouth at bedtime.     Historical Provider, MD  atorvastatin (LIPITOR) 20 MG tablet Take 1 tablet (20 mg total) by mouth daily. 06/21/13   Dorothy Spark, MD  cloNIDine (CATAPRES - DOSED IN MG/24 HR) 0.3 mg/24hr Place 1 patch onto the skin once a week. Saturday.    Historical Provider, MD  esomeprazole (NEXIUM) 40 MG capsule Take 40-80 mg by mouth daily as needed (reflux/indigestion. Dose varies depending on severity.).     Historical Provider, MD  lisinopril (PRINIVIL,ZESTRIL) 40 MG tablet Take 40 mg by mouth at bedtime.     Historical Provider, MD  metoprolol succinate (TOPROL XL) 50 MG 24 hr tablet Take 1 tablet (50 mg total) by mouth daily. Take with or immediately following a meal. 11/03/13   Dorothy Spark, MD  multivitamin (RENA-VIT) TABS tablet Take 1 tablet by mouth at bedtime. 07/14/13   Sheila Oats, MD   BP 149/83 mmHg  Pulse 71  Temp(Src) 98.3 F (36.8 C) (Oral)  Resp 18  Ht 5' 6"  (1.676 m)  Wt 154 lb (69.854 kg)  BMI 24.87 kg/m2  SpO2 98% Physical Exam  Constitutional: He appears well-developed and well-nourished. No distress.  HENT:  Head: Normocephalic and atraumatic.  Neck: Neck supple.  Pulmonary/Chest: Effort normal.  Musculoskeletal:       Right ankle: Normal.       Right lower leg: Normal.       Feet:  Right great toe tender to palpation.  No break in skin.  Sensation intact, capillary refill < 2 seconds.   Neurological: He is alert.  Skin: He is not diaphoretic.  Nursing note and vitals reviewed.   ED Course  Procedures (including critical care time) Labs Review Labs Reviewed - No data to display  Imaging Review Dg Foot Complete Right  05/06/2014   CLINICAL DATA:  Acute onset of right anterior great toe pain after falling while working in yard. Right great toe swelling. Initial encounter.  EXAM: RIGHT FOOT COMPLETE - 3+ VIEW  COMPARISON:  None.  FINDINGS: There is no evidence of fracture or  dislocation. The joint spaces are preserved. There is no evidence of talar subluxation; the subtalar joint is unremarkable in appearance.  Mild soft tissue swelling is suggested about the great toe.  IMPRESSION: No evidence of fracture or dislocation.   Electronically Signed   By: Garald Balding M.D.   On: 05/06/2014 07:09     EKG Interpretation None      MDM   Final diagnoses:  Toe injury, right, initial encounter    Afebrile, nontoxic patient with injury to his right great toe while gardening, stubbed it on a brick.   Xray negative.  Neurovascularly intact.  No break in skin or other wound noted. Compartments soft.   D/C home with postop shoe, RICE instructions.  Pt has  own crutches.  PCP follow up.  Discussed result, findings, treatment, and follow up  with patient.  Pt given return precautions.  Pt verbalizes understanding and agrees with plan.        [place], PA-C 05/06/14 [id number]  Debby Freiberg, MD 05/09/14 [phone]

## 2014-05-08 DIAGNOSIS — N186 End stage renal disease: Secondary | ICD-10-CM | POA: Diagnosis not present

## 2014-05-08 DIAGNOSIS — N2581 Secondary hyperparathyroidism of renal origin: Secondary | ICD-10-CM | POA: Diagnosis not present

## 2014-05-08 DIAGNOSIS — D631 Anemia in chronic kidney disease: Secondary | ICD-10-CM | POA: Diagnosis not present

## 2014-05-08 DIAGNOSIS — D509 Iron deficiency anemia, unspecified: Secondary | ICD-10-CM | POA: Diagnosis not present

## 2014-05-08 DIAGNOSIS — E119 Type 2 diabetes mellitus without complications: Secondary | ICD-10-CM | POA: Diagnosis not present

## 2014-05-08 DIAGNOSIS — Z4931 Encounter for adequacy testing for hemodialysis: Secondary | ICD-10-CM | POA: Diagnosis not present

## 2014-05-10 DIAGNOSIS — D631 Anemia in chronic kidney disease: Secondary | ICD-10-CM | POA: Diagnosis not present

## 2014-05-10 DIAGNOSIS — Z4931 Encounter for adequacy testing for hemodialysis: Secondary | ICD-10-CM | POA: Diagnosis not present

## 2014-05-10 DIAGNOSIS — N2581 Secondary hyperparathyroidism of renal origin: Secondary | ICD-10-CM | POA: Diagnosis not present

## 2014-05-10 DIAGNOSIS — E119 Type 2 diabetes mellitus without complications: Secondary | ICD-10-CM | POA: Diagnosis not present

## 2014-05-10 DIAGNOSIS — N186 End stage renal disease: Secondary | ICD-10-CM | POA: Diagnosis not present

## 2014-05-10 DIAGNOSIS — D509 Iron deficiency anemia, unspecified: Secondary | ICD-10-CM | POA: Diagnosis not present

## 2014-05-12 DIAGNOSIS — Z4931 Encounter for adequacy testing for hemodialysis: Secondary | ICD-10-CM | POA: Diagnosis not present

## 2014-05-12 DIAGNOSIS — D631 Anemia in chronic kidney disease: Secondary | ICD-10-CM | POA: Diagnosis not present

## 2014-05-12 DIAGNOSIS — D509 Iron deficiency anemia, unspecified: Secondary | ICD-10-CM | POA: Diagnosis not present

## 2014-05-12 DIAGNOSIS — E119 Type 2 diabetes mellitus without complications: Secondary | ICD-10-CM | POA: Diagnosis not present

## 2014-05-12 DIAGNOSIS — N2581 Secondary hyperparathyroidism of renal origin: Secondary | ICD-10-CM | POA: Diagnosis not present

## 2014-05-12 DIAGNOSIS — N186 End stage renal disease: Secondary | ICD-10-CM | POA: Diagnosis not present

## 2014-05-15 DIAGNOSIS — D631 Anemia in chronic kidney disease: Secondary | ICD-10-CM | POA: Diagnosis not present

## 2014-05-15 DIAGNOSIS — N2581 Secondary hyperparathyroidism of renal origin: Secondary | ICD-10-CM | POA: Diagnosis not present

## 2014-05-15 DIAGNOSIS — D509 Iron deficiency anemia, unspecified: Secondary | ICD-10-CM | POA: Diagnosis not present

## 2014-05-15 DIAGNOSIS — N186 End stage renal disease: Secondary | ICD-10-CM | POA: Diagnosis not present

## 2014-05-15 DIAGNOSIS — Z4931 Encounter for adequacy testing for hemodialysis: Secondary | ICD-10-CM | POA: Diagnosis not present

## 2014-05-15 DIAGNOSIS — E119 Type 2 diabetes mellitus without complications: Secondary | ICD-10-CM | POA: Diagnosis not present

## 2014-05-17 DIAGNOSIS — N186 End stage renal disease: Secondary | ICD-10-CM | POA: Diagnosis not present

## 2014-05-17 DIAGNOSIS — E119 Type 2 diabetes mellitus without complications: Secondary | ICD-10-CM | POA: Diagnosis not present

## 2014-05-17 DIAGNOSIS — Z4931 Encounter for adequacy testing for hemodialysis: Secondary | ICD-10-CM | POA: Diagnosis not present

## 2014-05-17 DIAGNOSIS — D631 Anemia in chronic kidney disease: Secondary | ICD-10-CM | POA: Diagnosis not present

## 2014-05-17 DIAGNOSIS — N2581 Secondary hyperparathyroidism of renal origin: Secondary | ICD-10-CM | POA: Diagnosis not present

## 2014-05-17 DIAGNOSIS — D509 Iron deficiency anemia, unspecified: Secondary | ICD-10-CM | POA: Diagnosis not present

## 2014-05-19 DIAGNOSIS — N2581 Secondary hyperparathyroidism of renal origin: Secondary | ICD-10-CM | POA: Diagnosis not present

## 2014-05-19 DIAGNOSIS — N186 End stage renal disease: Secondary | ICD-10-CM | POA: Diagnosis not present

## 2014-05-19 DIAGNOSIS — E119 Type 2 diabetes mellitus without complications: Secondary | ICD-10-CM | POA: Diagnosis not present

## 2014-05-19 DIAGNOSIS — D509 Iron deficiency anemia, unspecified: Secondary | ICD-10-CM | POA: Diagnosis not present

## 2014-05-19 DIAGNOSIS — D631 Anemia in chronic kidney disease: Secondary | ICD-10-CM | POA: Diagnosis not present

## 2014-05-19 DIAGNOSIS — Z4931 Encounter for adequacy testing for hemodialysis: Secondary | ICD-10-CM | POA: Diagnosis not present

## 2014-05-22 DIAGNOSIS — N186 End stage renal disease: Secondary | ICD-10-CM | POA: Diagnosis not present

## 2014-05-22 DIAGNOSIS — Z4931 Encounter for adequacy testing for hemodialysis: Secondary | ICD-10-CM | POA: Diagnosis not present

## 2014-05-22 DIAGNOSIS — D509 Iron deficiency anemia, unspecified: Secondary | ICD-10-CM | POA: Diagnosis not present

## 2014-05-22 DIAGNOSIS — D631 Anemia in chronic kidney disease: Secondary | ICD-10-CM | POA: Diagnosis not present

## 2014-05-22 DIAGNOSIS — N2581 Secondary hyperparathyroidism of renal origin: Secondary | ICD-10-CM | POA: Diagnosis not present

## 2014-05-22 DIAGNOSIS — E119 Type 2 diabetes mellitus without complications: Secondary | ICD-10-CM | POA: Diagnosis not present

## 2014-05-24 DIAGNOSIS — D631 Anemia in chronic kidney disease: Secondary | ICD-10-CM | POA: Diagnosis not present

## 2014-05-24 DIAGNOSIS — N186 End stage renal disease: Secondary | ICD-10-CM | POA: Diagnosis not present

## 2014-05-24 DIAGNOSIS — Z4931 Encounter for adequacy testing for hemodialysis: Secondary | ICD-10-CM | POA: Diagnosis not present

## 2014-05-24 DIAGNOSIS — N2581 Secondary hyperparathyroidism of renal origin: Secondary | ICD-10-CM | POA: Diagnosis not present

## 2014-05-24 DIAGNOSIS — E119 Type 2 diabetes mellitus without complications: Secondary | ICD-10-CM | POA: Diagnosis not present

## 2014-05-24 DIAGNOSIS — D509 Iron deficiency anemia, unspecified: Secondary | ICD-10-CM | POA: Diagnosis not present

## 2014-05-26 DIAGNOSIS — D631 Anemia in chronic kidney disease: Secondary | ICD-10-CM | POA: Diagnosis not present

## 2014-05-26 DIAGNOSIS — N186 End stage renal disease: Secondary | ICD-10-CM | POA: Diagnosis not present

## 2014-05-26 DIAGNOSIS — Z4931 Encounter for adequacy testing for hemodialysis: Secondary | ICD-10-CM | POA: Diagnosis not present

## 2014-05-26 DIAGNOSIS — E119 Type 2 diabetes mellitus without complications: Secondary | ICD-10-CM | POA: Diagnosis not present

## 2014-05-26 DIAGNOSIS — D509 Iron deficiency anemia, unspecified: Secondary | ICD-10-CM | POA: Diagnosis not present

## 2014-05-26 DIAGNOSIS — N2581 Secondary hyperparathyroidism of renal origin: Secondary | ICD-10-CM | POA: Diagnosis not present

## 2014-05-29 DIAGNOSIS — Z4931 Encounter for adequacy testing for hemodialysis: Secondary | ICD-10-CM | POA: Diagnosis not present

## 2014-05-29 DIAGNOSIS — N186 End stage renal disease: Secondary | ICD-10-CM | POA: Diagnosis not present

## 2014-05-29 DIAGNOSIS — E119 Type 2 diabetes mellitus without complications: Secondary | ICD-10-CM | POA: Diagnosis not present

## 2014-05-29 DIAGNOSIS — D509 Iron deficiency anemia, unspecified: Secondary | ICD-10-CM | POA: Diagnosis not present

## 2014-05-29 DIAGNOSIS — N2581 Secondary hyperparathyroidism of renal origin: Secondary | ICD-10-CM | POA: Diagnosis not present

## 2014-05-29 DIAGNOSIS — D631 Anemia in chronic kidney disease: Secondary | ICD-10-CM | POA: Diagnosis not present

## 2014-05-31 DIAGNOSIS — Z4931 Encounter for adequacy testing for hemodialysis: Secondary | ICD-10-CM | POA: Diagnosis not present

## 2014-05-31 DIAGNOSIS — N2581 Secondary hyperparathyroidism of renal origin: Secondary | ICD-10-CM | POA: Diagnosis not present

## 2014-05-31 DIAGNOSIS — D631 Anemia in chronic kidney disease: Secondary | ICD-10-CM | POA: Diagnosis not present

## 2014-05-31 DIAGNOSIS — E119 Type 2 diabetes mellitus without complications: Secondary | ICD-10-CM | POA: Diagnosis not present

## 2014-05-31 DIAGNOSIS — D509 Iron deficiency anemia, unspecified: Secondary | ICD-10-CM | POA: Diagnosis not present

## 2014-05-31 DIAGNOSIS — N186 End stage renal disease: Secondary | ICD-10-CM | POA: Diagnosis not present

## 2014-06-02 DIAGNOSIS — D631 Anemia in chronic kidney disease: Secondary | ICD-10-CM | POA: Diagnosis not present

## 2014-06-02 DIAGNOSIS — N186 End stage renal disease: Secondary | ICD-10-CM | POA: Diagnosis not present

## 2014-06-02 DIAGNOSIS — E119 Type 2 diabetes mellitus without complications: Secondary | ICD-10-CM | POA: Diagnosis not present

## 2014-06-02 DIAGNOSIS — N2581 Secondary hyperparathyroidism of renal origin: Secondary | ICD-10-CM | POA: Diagnosis not present

## 2014-06-02 DIAGNOSIS — Z4931 Encounter for adequacy testing for hemodialysis: Secondary | ICD-10-CM | POA: Diagnosis not present

## 2014-06-02 DIAGNOSIS — D509 Iron deficiency anemia, unspecified: Secondary | ICD-10-CM | POA: Diagnosis not present

## 2014-06-03 DIAGNOSIS — N186 End stage renal disease: Secondary | ICD-10-CM | POA: Diagnosis not present

## 2014-06-03 DIAGNOSIS — I12 Hypertensive chronic kidney disease with 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: Secondary | ICD-10-CM | POA: Diagnosis not present

## 2014-06-03 DIAGNOSIS — Z992 Dependence on renal dialysis: Secondary | ICD-10-CM | POA: Diagnosis not present

## 2014-06-05 DIAGNOSIS — Z4931 Encounter for adequacy testing for hemodialysis: Secondary | ICD-10-CM | POA: Diagnosis not present

## 2014-06-05 DIAGNOSIS — E119 Type 2 diabetes mellitus without complications: Secondary | ICD-10-CM | POA: Diagnosis not present

## 2014-06-05 DIAGNOSIS — N186 End stage renal disease: Secondary | ICD-10-CM | POA: Diagnosis not present

## 2014-06-05 DIAGNOSIS — D631 Anemia in chronic kidney disease: Secondary | ICD-10-CM | POA: Diagnosis not present

## 2014-06-05 DIAGNOSIS — N2581 Secondary hyperparathyroidism of renal origin: Secondary | ICD-10-CM | POA: Diagnosis not present

## 2014-06-05 DIAGNOSIS — D509 Iron deficiency anemia, unspecified: Secondary | ICD-10-CM | POA: Diagnosis not present

## 2014-06-07 DIAGNOSIS — Z4931 Encounter for adequacy testing for hemodialysis: Secondary | ICD-10-CM | POA: Diagnosis not present

## 2014-06-07 DIAGNOSIS — D509 Iron deficiency anemia, unspecified: Secondary | ICD-10-CM | POA: Diagnosis not present

## 2014-06-07 DIAGNOSIS — D631 Anemia in chronic kidney disease: Secondary | ICD-10-CM | POA: Diagnosis not present

## 2014-06-07 DIAGNOSIS — N2581 Secondary hyperparathyroidism of renal origin: Secondary | ICD-10-CM | POA: Diagnosis not present

## 2014-06-07 DIAGNOSIS — E119 Type 2 diabetes mellitus without complications: Secondary | ICD-10-CM | POA: Diagnosis not present

## 2014-06-07 DIAGNOSIS — N186 End stage renal disease: Secondary | ICD-10-CM | POA: Diagnosis not present

## 2014-06-09 DIAGNOSIS — N186 End stage renal disease: Secondary | ICD-10-CM | POA: Diagnosis not present

## 2014-06-09 DIAGNOSIS — E119 Type 2 diabetes mellitus without complications: Secondary | ICD-10-CM | POA: Diagnosis not present

## 2014-06-09 DIAGNOSIS — N2581 Secondary hyperparathyroidism of renal origin: Secondary | ICD-10-CM | POA: Diagnosis not present

## 2014-06-09 DIAGNOSIS — D631 Anemia in chronic kidney disease: Secondary | ICD-10-CM | POA: Diagnosis not present

## 2014-06-09 DIAGNOSIS — D509 Iron deficiency anemia, unspecified: Secondary | ICD-10-CM | POA: Diagnosis not present

## 2014-06-09 DIAGNOSIS — Z4931 Encounter for adequacy testing for hemodialysis: Secondary | ICD-10-CM | POA: Diagnosis not present

## 2014-06-12 DIAGNOSIS — D631 Anemia in chronic kidney disease: Secondary | ICD-10-CM | POA: Diagnosis not present

## 2014-06-12 DIAGNOSIS — N2581 Secondary hyperparathyroidism of renal origin: Secondary | ICD-10-CM | POA: Diagnosis not present

## 2014-06-12 DIAGNOSIS — Z4931 Encounter for adequacy testing for hemodialysis: Secondary | ICD-10-CM | POA: Diagnosis not present

## 2014-06-12 DIAGNOSIS — N186 End stage renal disease: Secondary | ICD-10-CM | POA: Diagnosis not present

## 2014-06-12 DIAGNOSIS — D509 Iron deficiency anemia, unspecified: Secondary | ICD-10-CM | POA: Diagnosis not present

## 2014-06-12 DIAGNOSIS — E119 Type 2 diabetes mellitus without complications: Secondary | ICD-10-CM | POA: Diagnosis not present

## 2014-06-14 DIAGNOSIS — E119 Type 2 diabetes mellitus without complications: Secondary | ICD-10-CM | POA: Diagnosis not present

## 2014-06-14 DIAGNOSIS — N186 End stage renal disease: Secondary | ICD-10-CM | POA: Diagnosis not present

## 2014-06-14 DIAGNOSIS — Z4931 Encounter for adequacy testing for hemodialysis: Secondary | ICD-10-CM | POA: Diagnosis not present

## 2014-06-14 DIAGNOSIS — D509 Iron deficiency anemia, unspecified: Secondary | ICD-10-CM | POA: Diagnosis not present

## 2014-06-14 DIAGNOSIS — D631 Anemia in chronic kidney disease: Secondary | ICD-10-CM | POA: Diagnosis not present

## 2014-06-14 DIAGNOSIS — N2581 Secondary hyperparathyroidism of renal origin: Secondary | ICD-10-CM | POA: Diagnosis not present

## 2014-06-16 DIAGNOSIS — N186 End stage renal disease: Secondary | ICD-10-CM | POA: Diagnosis not present

## 2014-06-16 DIAGNOSIS — D631 Anemia in chronic kidney disease: Secondary | ICD-10-CM | POA: Diagnosis not present

## 2014-06-16 DIAGNOSIS — D509 Iron deficiency anemia, unspecified: Secondary | ICD-10-CM | POA: Diagnosis not present

## 2014-06-16 DIAGNOSIS — E119 Type 2 diabetes mellitus without complications: Secondary | ICD-10-CM | POA: Diagnosis not present

## 2014-06-16 DIAGNOSIS — Z4931 Encounter for adequacy testing for hemodialysis: Secondary | ICD-10-CM | POA: Diagnosis not present

## 2014-06-16 DIAGNOSIS — N2581 Secondary hyperparathyroidism of renal origin: Secondary | ICD-10-CM | POA: Diagnosis not present

## 2014-06-19 DIAGNOSIS — Z4931 Encounter for adequacy testing for hemodialysis: Secondary | ICD-10-CM | POA: Diagnosis not present

## 2014-06-19 DIAGNOSIS — D509 Iron deficiency anemia, unspecified: Secondary | ICD-10-CM | POA: Diagnosis not present

## 2014-06-19 DIAGNOSIS — N186 End stage renal disease: Secondary | ICD-10-CM | POA: Diagnosis not present

## 2014-06-19 DIAGNOSIS — E119 Type 2 diabetes mellitus without complications: Secondary | ICD-10-CM | POA: Diagnosis not present

## 2014-06-19 DIAGNOSIS — D631 Anemia in chronic kidney disease: Secondary | ICD-10-CM | POA: Diagnosis not present

## 2014-06-19 DIAGNOSIS — N2581 Secondary hyperparathyroidism of renal origin: Secondary | ICD-10-CM | POA: Diagnosis not present

## 2014-06-21 DIAGNOSIS — D631 Anemia in chronic kidney disease: Secondary | ICD-10-CM | POA: Diagnosis not present

## 2014-06-21 DIAGNOSIS — D509 Iron deficiency anemia, unspecified: Secondary | ICD-10-CM | POA: Diagnosis not present

## 2014-06-21 DIAGNOSIS — N2581 Secondary hyperparathyroidism of renal origin: Secondary | ICD-10-CM | POA: Diagnosis not present

## 2014-06-21 DIAGNOSIS — Z4931 Encounter for adequacy testing for hemodialysis: Secondary | ICD-10-CM | POA: Diagnosis not present

## 2014-06-21 DIAGNOSIS — E119 Type 2 diabetes mellitus without complications: Secondary | ICD-10-CM | POA: Diagnosis not present

## 2014-06-21 DIAGNOSIS — N186 End stage renal disease: Secondary | ICD-10-CM | POA: Diagnosis not present

## 2014-06-23 DIAGNOSIS — N186 End stage renal disease: Secondary | ICD-10-CM | POA: Diagnosis not present

## 2014-06-23 DIAGNOSIS — Z4931 Encounter for adequacy testing for hemodialysis: Secondary | ICD-10-CM | POA: Diagnosis not present

## 2014-06-23 DIAGNOSIS — E119 Type 2 diabetes mellitus without complications: Secondary | ICD-10-CM | POA: Diagnosis not present

## 2014-06-23 DIAGNOSIS — D631 Anemia in chronic kidney disease: Secondary | ICD-10-CM | POA: Diagnosis not present

## 2014-06-23 DIAGNOSIS — D509 Iron deficiency anemia, unspecified: Secondary | ICD-10-CM | POA: Diagnosis not present

## 2014-06-23 DIAGNOSIS — N2581 Secondary hyperparathyroidism of renal origin: Secondary | ICD-10-CM | POA: Diagnosis not present

## 2014-06-26 DIAGNOSIS — D509 Iron deficiency anemia, unspecified: Secondary | ICD-10-CM | POA: Diagnosis not present

## 2014-06-26 DIAGNOSIS — D631 Anemia in chronic kidney disease: Secondary | ICD-10-CM | POA: Diagnosis not present

## 2014-06-26 DIAGNOSIS — E119 Type 2 diabetes mellitus without complications: Secondary | ICD-10-CM | POA: Diagnosis not present

## 2014-06-26 DIAGNOSIS — Z4931 Encounter for adequacy testing for hemodialysis: Secondary | ICD-10-CM | POA: Diagnosis not present

## 2014-06-26 DIAGNOSIS — N186 End stage renal disease: Secondary | ICD-10-CM | POA: Diagnosis not present

## 2014-06-26 DIAGNOSIS — N2581 Secondary hyperparathyroidism of renal origin: Secondary | ICD-10-CM | POA: Diagnosis not present

## 2014-06-28 DIAGNOSIS — Z4931 Encounter for adequacy testing for hemodialysis: Secondary | ICD-10-CM | POA: Diagnosis not present

## 2014-06-28 DIAGNOSIS — D509 Iron deficiency anemia, unspecified: Secondary | ICD-10-CM | POA: Diagnosis not present

## 2014-06-28 DIAGNOSIS — E119 Type 2 diabetes mellitus without complications: Secondary | ICD-10-CM | POA: Diagnosis not present

## 2014-06-28 DIAGNOSIS — D631 Anemia in chronic kidney disease: Secondary | ICD-10-CM | POA: Diagnosis not present

## 2014-06-28 DIAGNOSIS — N186 End stage renal disease: Secondary | ICD-10-CM | POA: Diagnosis not present

## 2014-06-28 DIAGNOSIS — N2581 Secondary hyperparathyroidism of renal origin: Secondary | ICD-10-CM | POA: Diagnosis not present

## 2014-06-30 DIAGNOSIS — E119 Type 2 diabetes mellitus without complications: Secondary | ICD-10-CM | POA: Diagnosis not present

## 2014-06-30 DIAGNOSIS — D631 Anemia in chronic kidney disease: Secondary | ICD-10-CM | POA: Diagnosis not present

## 2014-06-30 DIAGNOSIS — N186 End stage renal disease: Secondary | ICD-10-CM | POA: Diagnosis not present

## 2014-06-30 DIAGNOSIS — Z4931 Encounter for adequacy testing for hemodialysis: Secondary | ICD-10-CM | POA: Diagnosis not present

## 2014-06-30 DIAGNOSIS — N2581 Secondary hyperparathyroidism of renal origin: Secondary | ICD-10-CM | POA: Diagnosis not present

## 2014-06-30 DIAGNOSIS — D509 Iron deficiency anemia, unspecified: Secondary | ICD-10-CM | POA: Diagnosis not present

## 2014-07-04 ENCOUNTER — Other Ambulatory Visit (HOSPITAL_COMMUNITY): Payer: Self-pay | Admitting: Nephrology

## 2014-07-04 DIAGNOSIS — D509 Iron deficiency anemia, unspecified: Secondary | ICD-10-CM | POA: Diagnosis not present

## 2014-07-04 DIAGNOSIS — I12 Hypertensive chronic kidney disease with 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: Secondary | ICD-10-CM | POA: Diagnosis not present

## 2014-07-04 DIAGNOSIS — Z992 Dependence on renal dialysis: Secondary | ICD-10-CM | POA: Diagnosis not present

## 2014-07-04 DIAGNOSIS — N186 End stage renal disease: Secondary | ICD-10-CM

## 2014-07-04 DIAGNOSIS — D631 Anemia in chronic kidney disease: Secondary | ICD-10-CM | POA: Diagnosis not present

## 2014-07-04 DIAGNOSIS — N2581 Secondary hyperparathyroidism of renal origin: Secondary | ICD-10-CM | POA: Diagnosis not present

## 2014-07-04 DIAGNOSIS — I871 Compression of vein: Secondary | ICD-10-CM | POA: Diagnosis not present

## 2014-07-04 DIAGNOSIS — Z4931 Encounter for adequacy testing for hemodialysis: Secondary | ICD-10-CM | POA: Diagnosis not present

## 2014-07-04 DIAGNOSIS — T82858D Stenosis of vascular prosthetic devices, implants and grafts, subsequent encounter: Secondary | ICD-10-CM | POA: Diagnosis not present

## 2014-07-04 DIAGNOSIS — E119 Type 2 diabetes mellitus without complications: Secondary | ICD-10-CM | POA: Diagnosis not present

## 2014-07-05 ENCOUNTER — Ambulatory Visit (HOSPITAL_COMMUNITY)
Admission: RE | Admit: 2014-07-05 | Discharge: 2014-07-05 | Disposition: A | Discharge status: Home / Self Care | Payer: Medicare Other | Source: Ambulatory Visit | Attending: Nephrology | Admitting: Nephrology

## 2014-07-05 ENCOUNTER — Other Ambulatory Visit (HOSPITAL_COMMUNITY): Payer: Self-pay | Admitting: Nephrology

## 2014-07-05 DIAGNOSIS — Y832 Surgical operation with anastomosis, bypass or graft as the cause of abnormal reaction of the patient, or of later complication, without mention of misadventure at the time of the procedure: Secondary | ICD-10-CM | POA: Insufficient documentation

## 2014-07-05 DIAGNOSIS — T82868A Thrombosis of vascular prosthetic devices, implants and grafts, initial encounter: Secondary | ICD-10-CM | POA: Diagnosis present

## 2014-07-05 DIAGNOSIS — N186 End stage renal disease: Secondary | ICD-10-CM

## 2014-07-05 DIAGNOSIS — T82898A Other specified complication of vascular prosthetic devices, implants and grafts, initial encounter: Secondary | ICD-10-CM | POA: Insufficient documentation

## 2014-07-05 MED ORDER — DIAZEPAM 5 MG PO TABS: 5.0000 mg | ORAL_TABLET | Freq: Once | ORAL | Status: DC

## 2014-07-05 MED ORDER — ALTEPLASE 100 MG IV SOLR
4.0000 mg | Freq: Once | INTRAVENOUS | Status: DC
  Filled 2014-07-05 (×2): qty 4

## 2014-07-05 MED ORDER — DIAZEPAM 5 MG/ML IJ SOLN
INTRAMUSCULAR | Status: DC
Start: 2014-07-05 — End: 2014-07-06
  Filled 2014-07-05: qty 2

## 2014-07-05 MED ORDER — HEPARIN SODIUM (PORCINE) 1000 UNIT/ML IJ SOLN
INTRAMUSCULAR | Status: AC
  Filled 2014-07-05: qty 1

## 2014-07-05 MED ORDER — MIDAZOLAM HCL 2 MG/2ML IJ SOLN
INTRAMUSCULAR | Status: AC | PRN
  Administered 2014-07-05: 1 mg via INTRAVENOUS

## 2014-07-05 MED ORDER — DIAZEPAM 5 MG/ML IJ SOLN: 2.5000 mg | Freq: Once | INTRAMUSCULAR | Status: DC

## 2014-07-05 MED ORDER — IOHEXOL 300 MG/ML  SOLN
100.0000 mL | Freq: Once | INTRAMUSCULAR | Status: AC | PRN
  Administered 2014-07-05: 40 mL via INTRAVENOUS

## 2014-07-05 MED ORDER — ALTEPLASE 100 MG IV SOLR: 2.0000 mg | Freq: Once | INTRAVENOUS | Status: DC

## 2014-07-05 MED ORDER — MIDAZOLAM HCL 2 MG/2ML IJ SOLN
INTRAMUSCULAR | Status: AC
  Filled 2014-07-05: qty 2

## 2014-07-05 MED ORDER — FENTANYL CITRATE (PF) 100 MCG/2ML IJ SOLN
INTRAMUSCULAR | Status: AC
  Filled 2014-07-05: qty 4

## 2014-07-05 MED ORDER — DIAZEPAM 5 MG/ML IJ SOLN
INTRAMUSCULAR | Status: AC | PRN
  Administered 2014-07-05 (×2): 2.5 mg via INTRAVENOUS

## 2014-07-05 MED ORDER — LIDOCAINE HCL 1 % IJ SOLN
INTRAMUSCULAR | Status: AC
  Filled 2014-07-05: qty 20

## 2014-07-05 MED ORDER — FENTANYL CITRATE (PF) 100 MCG/2ML IJ SOLN
INTRAMUSCULAR | Status: AC | PRN
  Administered 2014-07-05 (×3): 50 ug via INTRAVENOUS

## 2014-07-05 NOTE — Procedures (Signed)
Interventional Radiology Procedure Note  Procedure: US guided access of right upper extremity AV fistula, thrombosed. Declot of aneurysmal segment in the antecubital region.  PTA of AV anastamosis. Venogram.  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   There is a single outflow vein via the cephalic vein which is atretic.   No central stenosis.  AV anastamosis stenosis, balloon angioplasty with 75m balloon.  Return of flow into the aneurysmal segment, with no flow though the atretic cephalic vein.  No venous stenosis.   Complications: No immediate Recommendations:  - Ok to shower tomorrow - Fistula is not amenable to future intervention.  - Patient has an operable HD catheter.   Signed,  JDulcy Fanny WEarleen Newport DO

## 2014-07-05 NOTE — Progress Notes (Signed)
Client c/o hemodialysis catheter bleeding and sterile dressing changed and no bleeding noted

## 2014-07-07 ENCOUNTER — Other Ambulatory Visit: Payer: Self-pay | Admitting: Cardiology

## 2014-07-07 DIAGNOSIS — D509 Iron deficiency anemia, unspecified: Secondary | ICD-10-CM | POA: Diagnosis not present

## 2014-07-07 DIAGNOSIS — N186 End stage renal disease: Secondary | ICD-10-CM | POA: Diagnosis not present

## 2014-07-07 DIAGNOSIS — N2581 Secondary hyperparathyroidism of renal origin: Secondary | ICD-10-CM | POA: Diagnosis not present

## 2014-07-07 DIAGNOSIS — D631 Anemia in chronic kidney disease: Secondary | ICD-10-CM | POA: Diagnosis not present

## 2014-07-07 DIAGNOSIS — Z4931 Encounter for adequacy testing for hemodialysis: Secondary | ICD-10-CM | POA: Diagnosis not present

## 2014-07-10 DIAGNOSIS — N2581 Secondary hyperparathyroidism of renal origin: Secondary | ICD-10-CM | POA: Diagnosis not present

## 2014-07-10 DIAGNOSIS — D631 Anemia in chronic kidney disease: Secondary | ICD-10-CM | POA: Diagnosis not present

## 2014-07-10 DIAGNOSIS — Z4802 Encounter for removal of sutures: Secondary | ICD-10-CM | POA: Insufficient documentation

## 2014-07-10 DIAGNOSIS — N186 End stage renal disease: Secondary | ICD-10-CM | POA: Diagnosis not present

## 2014-07-10 DIAGNOSIS — D509 Iron deficiency anemia, unspecified: Secondary | ICD-10-CM | POA: Diagnosis not present

## 2014-07-10 DIAGNOSIS — Z4931 Encounter for adequacy testing for hemodialysis: Secondary | ICD-10-CM | POA: Diagnosis not present

## 2014-07-12 DIAGNOSIS — N2581 Secondary hyperparathyroidism of renal origin: Secondary | ICD-10-CM | POA: Diagnosis not present

## 2014-07-12 DIAGNOSIS — Z4931 Encounter for adequacy testing for hemodialysis: Secondary | ICD-10-CM | POA: Diagnosis not present

## 2014-07-12 DIAGNOSIS — N186 End stage renal disease: Secondary | ICD-10-CM | POA: Diagnosis not present

## 2014-07-12 DIAGNOSIS — D509 Iron deficiency anemia, unspecified: Secondary | ICD-10-CM | POA: Diagnosis not present

## 2014-07-12 DIAGNOSIS — D631 Anemia in chronic kidney disease: Secondary | ICD-10-CM | POA: Diagnosis not present

## 2014-07-13 ENCOUNTER — Other Ambulatory Visit: Payer: Self-pay | Admitting: *Deleted

## 2014-07-13 DIAGNOSIS — N186 End stage renal disease: Secondary | ICD-10-CM

## 2014-07-13 DIAGNOSIS — T82510A Breakdown (mechanical) of surgically created arteriovenous fistula, initial encounter: Secondary | ICD-10-CM

## 2014-07-13 DIAGNOSIS — Z01810 Encounter for preprocedural cardiovascular examination: Secondary | ICD-10-CM

## 2014-07-14 DIAGNOSIS — D631 Anemia in chronic kidney disease: Secondary | ICD-10-CM | POA: Diagnosis not present

## 2014-07-14 DIAGNOSIS — D509 Iron deficiency anemia, unspecified: Secondary | ICD-10-CM | POA: Diagnosis not present

## 2014-07-14 DIAGNOSIS — N2581 Secondary hyperparathyroidism of renal origin: Secondary | ICD-10-CM | POA: Diagnosis not present

## 2014-07-14 DIAGNOSIS — Z4931 Encounter for adequacy testing for hemodialysis: Secondary | ICD-10-CM | POA: Diagnosis not present

## 2014-07-14 DIAGNOSIS — N186 End stage renal disease: Secondary | ICD-10-CM | POA: Diagnosis not present

## 2014-07-17 DIAGNOSIS — N2581 Secondary hyperparathyroidism of renal origin: Secondary | ICD-10-CM | POA: Diagnosis not present

## 2014-07-17 DIAGNOSIS — D509 Iron deficiency anemia, unspecified: Secondary | ICD-10-CM | POA: Diagnosis not present

## 2014-07-17 DIAGNOSIS — N186 End stage renal disease: Secondary | ICD-10-CM | POA: Diagnosis not present

## 2014-07-17 DIAGNOSIS — D631 Anemia in chronic kidney disease: Secondary | ICD-10-CM | POA: Diagnosis not present

## 2014-07-17 DIAGNOSIS — Z4931 Encounter for adequacy testing for hemodialysis: Secondary | ICD-10-CM | POA: Diagnosis not present

## 2014-07-19 DIAGNOSIS — D631 Anemia in chronic kidney disease: Secondary | ICD-10-CM | POA: Diagnosis not present

## 2014-07-19 DIAGNOSIS — N186 End stage renal disease: Secondary | ICD-10-CM | POA: Diagnosis not present

## 2014-07-19 DIAGNOSIS — Z4931 Encounter for adequacy testing for hemodialysis: Secondary | ICD-10-CM | POA: Diagnosis not present

## 2014-07-19 DIAGNOSIS — D509 Iron deficiency anemia, unspecified: Secondary | ICD-10-CM | POA: Diagnosis not present

## 2014-07-19 DIAGNOSIS — N2581 Secondary hyperparathyroidism of renal origin: Secondary | ICD-10-CM | POA: Diagnosis not present

## 2014-07-20 ENCOUNTER — Encounter (HOSPITAL_COMMUNITY): Payer: Medicare Other

## 2014-07-20 ENCOUNTER — Ambulatory Visit (INDEPENDENT_AMBULATORY_CARE_PROVIDER_SITE_OTHER)
Admission: RE | Admit: 2014-07-20 | Discharge: 2014-07-20 | Disposition: A | Discharge status: Home / Self Care | Payer: Medicare Other | Source: Ambulatory Visit | Attending: Vascular Surgery | Admitting: Vascular Surgery

## 2014-07-20 ENCOUNTER — Other Ambulatory Visit (HOSPITAL_COMMUNITY): Payer: Medicare Other

## 2014-07-20 ENCOUNTER — Ambulatory Visit (HOSPITAL_COMMUNITY)
Admission: RE | Admit: 2014-07-20 | Discharge: 2014-07-20 | Disposition: A | Discharge status: Home / Self Care | Payer: Medicare Other | Source: Ambulatory Visit | Attending: Vascular Surgery | Admitting: Vascular Surgery

## 2014-07-20 DIAGNOSIS — Z01810 Encounter for preprocedural cardiovascular examination: Secondary | ICD-10-CM | POA: Diagnosis not present

## 2014-07-20 DIAGNOSIS — T82510A Breakdown (mechanical) of surgically created arteriovenous fistula, initial encounter: Secondary | ICD-10-CM | POA: Diagnosis not present

## 2014-07-20 DIAGNOSIS — N186 End stage renal disease: Secondary | ICD-10-CM

## 2014-07-20 DIAGNOSIS — X58XXXA Exposure to other specified factors, initial encounter: Secondary | ICD-10-CM | POA: Diagnosis not present

## 2014-07-21 ENCOUNTER — Encounter: Payer: Self-pay | Admitting: Vascular Surgery

## 2014-07-21 DIAGNOSIS — D509 Iron deficiency anemia, unspecified: Secondary | ICD-10-CM | POA: Diagnosis not present

## 2014-07-21 DIAGNOSIS — N2581 Secondary hyperparathyroidism of renal origin: Secondary | ICD-10-CM | POA: Diagnosis not present

## 2014-07-21 DIAGNOSIS — D631 Anemia in chronic kidney disease: Secondary | ICD-10-CM | POA: Diagnosis not present

## 2014-07-21 DIAGNOSIS — N186 End stage renal disease: Secondary | ICD-10-CM | POA: Diagnosis not present

## 2014-07-21 DIAGNOSIS — Z4931 Encounter for adequacy testing for hemodialysis: Secondary | ICD-10-CM | POA: Diagnosis not present

## 2014-07-24 DIAGNOSIS — Z4931 Encounter for adequacy testing for hemodialysis: Secondary | ICD-10-CM | POA: Diagnosis not present

## 2014-07-24 DIAGNOSIS — D631 Anemia in chronic kidney disease: Secondary | ICD-10-CM | POA: Diagnosis not present

## 2014-07-24 DIAGNOSIS — D509 Iron deficiency anemia, unspecified: Secondary | ICD-10-CM | POA: Diagnosis not present

## 2014-07-24 DIAGNOSIS — N2581 Secondary hyperparathyroidism of renal origin: Secondary | ICD-10-CM | POA: Diagnosis not present

## 2014-07-24 DIAGNOSIS — N186 End stage renal disease: Secondary | ICD-10-CM | POA: Diagnosis not present

## 2014-07-25 ENCOUNTER — Other Ambulatory Visit: Payer: Self-pay

## 2014-07-25 ENCOUNTER — Encounter: Payer: Self-pay | Admitting: Vascular Surgery

## 2014-07-25 ENCOUNTER — Ambulatory Visit (INDEPENDENT_AMBULATORY_CARE_PROVIDER_SITE_OTHER): Payer: Medicare Other | Admitting: Vascular Surgery

## 2014-07-25 VITALS — BP 188/100 | HR 67 | Resp 18 | Ht 66.0 in | Wt 161.0 lb

## 2014-07-25 DIAGNOSIS — N186 End stage renal disease: Secondary | ICD-10-CM

## 2014-07-25 NOTE — Progress Notes (Signed)
Patient name: James Wall MRN: [id number] DOB: March 11, 1965 Sex: male   Referred by: Moshe Cipro  Reason for referral:  Chief Complaint  Patient presents with  . Re-evaluation    eval for new access    HISTORY OF PRESENT ILLNESS: Patient resisted today for discussion of hemodialysis access. He has a long history of hemodialysis. He most recently had the dialysis via a right upper arm brachiocephalic fistula was placed by Dr. [name] in January 2015. He recently had thrombosis of this and underwent attempted percutaneous treatment and interventional radiology. I have reviewed the images from this study. He had successful appearance of the fistula. Unfortunately he had a very large dilatation of the proximal 1/3-1/2 and had an extremely atretic cephalic vein from mid upper arm to his cephalic to subclavian junction. This is occluded immediately. He does have hemodialysis via a left IJ catheter and is here for discussion of new permanent access. He did have a prior left upper arm brachiocephalic fistula that he reports functioned for approximately 6 years. He reports that he takes care of his 47 year old mother independently with little assistance and is quite concerned regarding the impact of surgery and his ability to take care of her. He also adamantly does not want lower extremity access.  Past Medical History  Diagnosis Date  . ESRD on hemodialysis     Started HD Jan 2009.  ESRD was due to HTN.  Dx'd with HTN in hospital 1996 according to pt, they had to keep him so he could get Medicaid to afford the BP medications.  First saw a nephrologist and started HD in the same year 2009.  Gets HD at [facility] on a MWF schedule.  Does not have DM. He had a left RC AVF that never functioned, a left upper arm AVF that worked for about 5 years and as of June  . Hypertension   . COPD (chronic obstructive pulmonary disease)   . Arthritis     GOUT  . Anemia   . Secondary hyperparathyroidism     . Anxiety   . Depression   . Shortness of breath     With exertion  . GERD (gastroesophageal reflux disease)   . Sepsis 02/2013    from AVF , treated with Vancomycin.  . Renal insufficiency     Past Surgical History  Procedure Laterality Date  . Av fistula placement  2009    Left lower arm AVF  . Ligation of arteriovenous  fistula Left 12/22/2012    Procedure: LIGATION OF ARTERIOVENOUS  FISTULA;EXCISION OF LARGE ANEURYSMS;;  Surgeon: Elam Dutch, MD;  Location: [place];  Service: Vascular;  Laterality: Left;  . Insertion of dialysis catheter N/A 12/23/2012    Procedure: INSERTION OF DIALYSIS CATHETER; ULTRASOUND GUIDED;  Surgeon: Angelia Mould, MD;  Location: [place];  Service: Vascular;  Laterality: N/A;  . Av fistula placement Right 02/22/2013    Procedure:  CREATION  OF BRACHIAL CEPHALIC FISTULA RIGHT ARM;  Surgeon: Elam Dutch, MD;  Location: [place];  Service: Vascular;  Laterality: Right;  . Esophagogastroduodenoscopy (egd) with propofol N/A 04/12/2013    Procedure: ESOPHAGOGASTRODUODENOSCOPY (EGD) WITH PROPOFOL;  Surgeon: Arta Silence, MD;  Location[facility];  Service: Endoscopy;  Laterality: N/A;  . Left heart catheterization with coronary angiogram N/A 07/13/2013    Procedure: LEFT HEART CATHETERIZATION WITH CORONARY ANGIOGRAM;  Surgeon: Jettie Booze, MD;  Location: [facility] CATH LAB;  Service: Cardiovascular;  Laterality: N/A;  History   Social History  . Marital Status: Single    Spouse Name: N/A  . Number of Children: 0  . Years of Education: 12th   Occupational History  . n/a    Social History Main Topics  . Smoking status: Current Every Day Smoker -- 0.50 packs/day for 16 years    Types: Cigarettes  . Smokeless tobacco: Never Used  . Alcohol Use: 0.0 oz/week    0 Standard drinks or equivalent per week     Comment: occassional  . Drug Use: No  . Sexual Activity: Not on file   Other Topics Concern  . Not on file   Social History  Narrative    Family History  Problem Relation Age of Onset  . Cerebrovascular Accident Father   . Hypertension Father   . Hypertension Mother   . Congestive Heart Failure Brother   . Asthma Brother     Allergies as of 07/25/2014 - Review Complete 07/25/2014  Allergen Reaction Noted  . Aspirin  04/06/2008    Current Outpatient Prescriptions on File Prior to Visit  Medication Sig Dispense Refill  . ALPRAZolam (XANAX) 1 MG tablet Take 1 mg by mouth at bedtime as needed for anxiety.    Marland Kitchen amLODipine (NORVASC) 10 MG tablet Take 10 mg by mouth at bedtime.     Marland Kitchen atorvastatin (LIPITOR) 20 MG tablet Take 1 tablet (20 mg total) by mouth daily. 90 tablet 3  . cloNIDine (CATAPRES - DOSED IN MG/24 HR) 0.3 mg/24hr Place 1 patch onto the skin once a week. Saturday.    . esomeprazole (NEXIUM) 40 MG capsule Take 40-80 mg by mouth daily as needed (reflux/indigestion. Dose varies depending on severity.).     Marland Kitchen lisinopril (PRINIVIL,ZESTRIL) 40 MG tablet Take 40 mg by mouth at bedtime.     . metoprolol succinate (TOPROL XL) 50 MG 24 hr tablet Take 1 tablet (50 mg total) by mouth daily. Take with or immediately following a meal. 90 tablet 1  . multivitamin (RENA-VIT) TABS tablet Take 1 tablet by mouth at bedtime. 30 each 0   No current facility-administered medications on file prior to visit.       PHYSICAL EXAMINATION:  General: The patient is a well-nourished male, in no acute distress. Vital signs are BP 188/100 mmHg  Pulse 67  Resp 18  Ht 5' 6"  (1.676 m)  Wt 161 lb (73.029 kg)  BMI 26.00 kg/m2 Pulmonary: There is a good air exchange  . Musculoskeletal: There are no major deformities.  There is no significant extremity pain. Neurologic: No focal weakness or paresthesias are detected, Skin: There are no ulcer or rashes noted. Psychiatric: The patient has normal affect. Cardiovascular: 2+ radial pulses bilaterally. Has scarring from an old left upper arm AV fistula also from a failed left  wrist Cimino fistula. 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   [facility] Vascular Lab Studies:  Ordered and Independently Reviewed studies from 07/20/2014 were discussed with the patient. He does have adequate arterial flow bilaterally. He does have moderate size basilic veins on the left some thrombosis of the basilic vein on the right.  Impression and Plan:  Had long discussion with patient regarding accessed options. Explained that he had better than typical experience on his first fistula left arm and more typical experience on the right arm. I have recommended exploration of his basilic vein but fillet he would be a good candidate for basilic vein fistula. Explained  that this is typically done in 2 stages but if he had a very large basilic vein could potentially be done in one stage with transposition at the time of initial surgery. Also explained that he may not have an adequate vein in that require a left arm prostatic Gore-Tex graft. This decision will be made at the time of surgery. He dialyzes on Monday Wednesday and Friday. I explained that I am in the office on Tuesday and Thursday and therefore would be one of my partners during the surgery. He is comfortable with this and will be scheduled as soon as possible    Yaviel Kloster [facility] [place] Office: [phone]

## 2014-07-25 NOTE — Progress Notes (Signed)
Filed Vitals:   07/25/14 0846 07/25/14 0849  BP: 192/100 188/100  Pulse: 67 67  Resp: 18   Height: 5' 6"  (1.676 m)   Weight: 161 lb (73.029 kg)    Body mass index is 26 kg/(m^2).

## 2014-07-26 DIAGNOSIS — Z4931 Encounter for adequacy testing for hemodialysis: Secondary | ICD-10-CM | POA: Diagnosis not present

## 2014-07-26 DIAGNOSIS — D631 Anemia in chronic kidney disease: Secondary | ICD-10-CM | POA: Diagnosis not present

## 2014-07-26 DIAGNOSIS — N186 End stage renal disease: Secondary | ICD-10-CM | POA: Diagnosis not present

## 2014-07-26 DIAGNOSIS — D509 Iron deficiency anemia, unspecified: Secondary | ICD-10-CM | POA: Diagnosis not present

## 2014-07-26 DIAGNOSIS — N2581 Secondary hyperparathyroidism of renal origin: Secondary | ICD-10-CM | POA: Diagnosis not present

## 2014-07-28 DIAGNOSIS — D509 Iron deficiency anemia, unspecified: Secondary | ICD-10-CM | POA: Diagnosis not present

## 2014-07-28 DIAGNOSIS — N2581 Secondary hyperparathyroidism of renal origin: Secondary | ICD-10-CM | POA: Diagnosis not present

## 2014-07-28 DIAGNOSIS — N186 End stage renal disease: Secondary | ICD-10-CM | POA: Diagnosis not present

## 2014-07-28 DIAGNOSIS — Z4931 Encounter for adequacy testing for hemodialysis: Secondary | ICD-10-CM | POA: Diagnosis not present

## 2014-07-28 DIAGNOSIS — D631 Anemia in chronic kidney disease: Secondary | ICD-10-CM | POA: Diagnosis not present

## 2014-07-31 DIAGNOSIS — D631 Anemia in chronic kidney disease: Secondary | ICD-10-CM | POA: Diagnosis not present

## 2014-07-31 DIAGNOSIS — Z4931 Encounter for adequacy testing for hemodialysis: Secondary | ICD-10-CM | POA: Diagnosis not present

## 2014-07-31 DIAGNOSIS — N2581 Secondary hyperparathyroidism of renal origin: Secondary | ICD-10-CM | POA: Diagnosis not present

## 2014-07-31 DIAGNOSIS — N186 End stage renal disease: Secondary | ICD-10-CM | POA: Diagnosis not present

## 2014-07-31 DIAGNOSIS — D509 Iron deficiency anemia, unspecified: Secondary | ICD-10-CM | POA: Diagnosis not present

## 2014-08-02 DIAGNOSIS — N186 End stage renal disease: Secondary | ICD-10-CM | POA: Diagnosis not present

## 2014-08-02 DIAGNOSIS — D509 Iron deficiency anemia, unspecified: Secondary | ICD-10-CM | POA: Diagnosis not present

## 2014-08-02 DIAGNOSIS — D631 Anemia in chronic kidney disease: Secondary | ICD-10-CM | POA: Diagnosis not present

## 2014-08-02 DIAGNOSIS — N2581 Secondary hyperparathyroidism of renal origin: Secondary | ICD-10-CM | POA: Diagnosis not present

## 2014-08-02 DIAGNOSIS — Z4931 Encounter for adequacy testing for hemodialysis: Secondary | ICD-10-CM | POA: Diagnosis not present

## 2014-08-03 DIAGNOSIS — H35033 Hypertensive retinopathy, bilateral: Secondary | ICD-10-CM | POA: Diagnosis not present

## 2014-08-03 DIAGNOSIS — H2513 Age-related nuclear cataract, bilateral: Secondary | ICD-10-CM | POA: Diagnosis not present

## 2014-08-03 DIAGNOSIS — I708 Atherosclerosis of other arteries: Secondary | ICD-10-CM | POA: Diagnosis not present

## 2014-08-03 DIAGNOSIS — Z992 Dependence on renal dialysis: Secondary | ICD-10-CM | POA: Diagnosis not present

## 2014-08-03 DIAGNOSIS — H35341 Macular cyst, hole, or pseudohole, right eye: Secondary | ICD-10-CM | POA: Diagnosis not present

## 2014-08-03 DIAGNOSIS — N186 End stage renal disease: Secondary | ICD-10-CM | POA: Diagnosis not present

## 2014-08-03 DIAGNOSIS — I12 Hypertensive chronic kidney disease with 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: Secondary | ICD-10-CM | POA: Diagnosis not present

## 2014-08-04 DIAGNOSIS — D631 Anemia in chronic kidney disease: Secondary | ICD-10-CM | POA: Diagnosis not present

## 2014-08-04 DIAGNOSIS — N2581 Secondary hyperparathyroidism of renal origin: Secondary | ICD-10-CM | POA: Diagnosis not present

## 2014-08-04 DIAGNOSIS — E119 Type 2 diabetes mellitus without complications: Secondary | ICD-10-CM | POA: Diagnosis not present

## 2014-08-04 DIAGNOSIS — Z4931 Encounter for adequacy testing for hemodialysis: Secondary | ICD-10-CM | POA: Diagnosis not present

## 2014-08-04 DIAGNOSIS — N186 End stage renal disease: Secondary | ICD-10-CM | POA: Diagnosis not present

## 2014-08-04 DIAGNOSIS — D509 Iron deficiency anemia, unspecified: Secondary | ICD-10-CM | POA: Diagnosis not present

## 2014-08-04 DIAGNOSIS — T8249XD Other complication of vascular dialysis catheter, subsequent encounter: Secondary | ICD-10-CM | POA: Diagnosis not present

## 2014-08-07 DIAGNOSIS — D509 Iron deficiency anemia, unspecified: Secondary | ICD-10-CM | POA: Diagnosis not present

## 2014-08-07 DIAGNOSIS — E119 Type 2 diabetes mellitus without complications: Secondary | ICD-10-CM | POA: Diagnosis not present

## 2014-08-07 DIAGNOSIS — T8249XD Other complication of vascular dialysis catheter, subsequent encounter: Secondary | ICD-10-CM | POA: Diagnosis not present

## 2014-08-07 DIAGNOSIS — Z4931 Encounter for adequacy testing for hemodialysis: Secondary | ICD-10-CM | POA: Diagnosis not present

## 2014-08-07 DIAGNOSIS — N2581 Secondary hyperparathyroidism of renal origin: Secondary | ICD-10-CM | POA: Diagnosis not present

## 2014-08-07 DIAGNOSIS — N186 End stage renal disease: Secondary | ICD-10-CM | POA: Diagnosis not present

## 2014-08-08 DIAGNOSIS — K7460 Unspecified cirrhosis of liver: Secondary | ICD-10-CM | POA: Diagnosis not present

## 2014-08-08 DIAGNOSIS — I10 Essential (primary) hypertension: Secondary | ICD-10-CM | POA: Diagnosis not present

## 2014-08-08 DIAGNOSIS — N400 Enlarged prostate without lower urinary tract symptoms: Secondary | ICD-10-CM | POA: Diagnosis not present

## 2014-08-08 DIAGNOSIS — N186 End stage renal disease: Secondary | ICD-10-CM | POA: Diagnosis not present

## 2014-08-08 DIAGNOSIS — E785 Hyperlipidemia, unspecified: Secondary | ICD-10-CM | POA: Diagnosis not present

## 2014-08-09 ENCOUNTER — Encounter (HOSPITAL_COMMUNITY): Payer: Self-pay | Admitting: *Deleted

## 2014-08-09 DIAGNOSIS — N2581 Secondary hyperparathyroidism of renal origin: Secondary | ICD-10-CM | POA: Diagnosis not present

## 2014-08-09 DIAGNOSIS — D509 Iron deficiency anemia, unspecified: Secondary | ICD-10-CM | POA: Diagnosis not present

## 2014-08-09 DIAGNOSIS — T8249XD Other complication of vascular dialysis catheter, subsequent encounter: Secondary | ICD-10-CM | POA: Diagnosis not present

## 2014-08-09 DIAGNOSIS — N186 End stage renal disease: Secondary | ICD-10-CM | POA: Diagnosis not present

## 2014-08-09 DIAGNOSIS — E119 Type 2 diabetes mellitus without complications: Secondary | ICD-10-CM | POA: Diagnosis not present

## 2014-08-09 DIAGNOSIS — Z4931 Encounter for adequacy testing for hemodialysis: Secondary | ICD-10-CM | POA: Diagnosis not present

## 2014-08-09 MED ORDER — DEXTROSE 5 % IV SOLN
1.5000 g | INTRAVENOUS | Status: AC
  Administered 2014-08-10: 1.5 g via INTRAVENOUS
  Filled 2014-08-09: qty 1.5

## 2014-08-09 NOTE — Progress Notes (Signed)
Called Dr. Evelena Leyden office and spoke with Zigmund Daniel, LPN to verify that pt is having surgery on Left arm. During pre-op call pt stated that he was having the surgery on his Right arm. Zigmund Daniel checked the notes from visit in June with Dr. Donnetta Hutching and everything says Left.

## 2014-08-10 ENCOUNTER — Ambulatory Visit (HOSPITAL_COMMUNITY)
Admission: RE | Admit: 2014-08-10 | Discharge: 2014-08-10 | Disposition: A | Discharge status: Home / Self Care | Payer: Medicare Other | Source: Ambulatory Visit | Attending: Vascular Surgery | Admitting: Vascular Surgery

## 2014-08-10 ENCOUNTER — Ambulatory Visit (HOSPITAL_COMMUNITY): Payer: Medicare Other | Admitting: Certified Registered Nurse Anesthetist

## 2014-08-10 ENCOUNTER — Encounter (HOSPITAL_COMMUNITY)
Admission: RE | Disposition: A | Discharge status: Home / Self Care | Payer: Self-pay | Source: Ambulatory Visit | Attending: Vascular Surgery

## 2014-08-10 ENCOUNTER — Encounter (HOSPITAL_COMMUNITY): Payer: Self-pay | Admitting: Certified Registered Nurse Anesthetist

## 2014-08-10 DIAGNOSIS — F419 Anxiety disorder, unspecified: Secondary | ICD-10-CM | POA: Diagnosis not present

## 2014-08-10 DIAGNOSIS — J45909 Unspecified asthma, uncomplicated: Secondary | ICD-10-CM | POA: Diagnosis not present

## 2014-08-10 DIAGNOSIS — Z79899 Other long term (current) drug therapy: Secondary | ICD-10-CM | POA: Insufficient documentation

## 2014-08-10 DIAGNOSIS — I440 Atrioventricular block, first degree: Secondary | ICD-10-CM | POA: Insufficient documentation

## 2014-08-10 DIAGNOSIS — N186 End stage renal disease: Secondary | ICD-10-CM | POA: Insufficient documentation

## 2014-08-10 DIAGNOSIS — I12 Hypertensive chronic kidney disease with 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: Secondary | ICD-10-CM | POA: Insufficient documentation

## 2014-08-10 DIAGNOSIS — M1990 Unspecified osteoarthritis, unspecified site: Secondary | ICD-10-CM | POA: Diagnosis not present

## 2014-08-10 DIAGNOSIS — G4730 Sleep apnea, unspecified: Secondary | ICD-10-CM | POA: Diagnosis not present

## 2014-08-10 DIAGNOSIS — F17210 Nicotine dependence, cigarettes, uncomplicated: Secondary | ICD-10-CM | POA: Insufficient documentation

## 2014-08-10 DIAGNOSIS — D649 Anemia, unspecified: Secondary | ICD-10-CM | POA: Diagnosis not present

## 2014-08-10 DIAGNOSIS — M1000 Idiopathic gout, unspecified site: Secondary | ICD-10-CM | POA: Insufficient documentation

## 2014-08-10 DIAGNOSIS — Z992 Dependence on renal dialysis: Secondary | ICD-10-CM | POA: Diagnosis not present

## 2014-08-10 DIAGNOSIS — N2581 Secondary hyperparathyroidism of renal origin: Secondary | ICD-10-CM | POA: Insufficient documentation

## 2014-08-10 DIAGNOSIS — R9431 Abnormal electrocardiogram [ECG] [EKG]: Secondary | ICD-10-CM | POA: Insufficient documentation

## 2014-08-10 DIAGNOSIS — J449 Chronic obstructive pulmonary disease, unspecified: Secondary | ICD-10-CM | POA: Insufficient documentation

## 2014-08-10 DIAGNOSIS — K219 Gastro-esophageal reflux disease without esophagitis: Secondary | ICD-10-CM | POA: Diagnosis not present

## 2014-08-10 DIAGNOSIS — R001 Bradycardia, unspecified: Secondary | ICD-10-CM | POA: Diagnosis not present

## 2014-08-10 DIAGNOSIS — R0602 Shortness of breath: Secondary | ICD-10-CM | POA: Diagnosis not present

## 2014-08-10 HISTORY — DX: Unspecified cirrhosis of liver: K74.60

## 2014-08-10 HISTORY — DX: Sleep apnea, unspecified: G47.30

## 2014-08-10 HISTORY — PX: AV FISTULA PLACEMENT: SHX1204

## 2014-08-10 LAB — GLUCOSE, CAPILLARY: GLUCOSE-CAPILLARY: 82 mg/dL (ref 65–99)

## 2014-08-10 LAB — POCT I-STAT 4, (NA,K, GLUC, HGB,HCT)
Glucose, Bld: 85 mg/dL (ref 65–99)
HCT: 36 % — ABNORMAL LOW (ref 39.0–52.0)
HEMOGLOBIN: 12.2 g/dL — AB (ref 13.0–17.0)
Potassium: 4.8 mmol/L (ref 3.5–5.1)
SODIUM: 138 mmol/L (ref 135–145)

## 2014-08-10 SURGERY — ARTERIOVENOUS (AV) FISTULA CREATION
Anesthesia: General | Site: Arm Upper | Laterality: Left

## 2014-08-10 MED ORDER — PROPOFOL 10 MG/ML IV BOLUS
INTRAVENOUS | Status: AC
  Filled 2014-08-10: qty 20

## 2014-08-10 MED ORDER — SUCCINYLCHOLINE CHLORIDE 20 MG/ML IJ SOLN
INTRAMUSCULAR | Status: AC
  Filled 2014-08-10: qty 1

## 2014-08-10 MED ORDER — SODIUM CHLORIDE 0.9 % IR SOLN
Status: DC | PRN
  Administered 2014-08-10: 07:00:00

## 2014-08-10 MED ORDER — LIDOCAINE HCL (CARDIAC) 20 MG/ML IV SOLN
INTRAVENOUS | Status: AC
  Filled 2014-08-10: qty 5

## 2014-08-10 MED ORDER — STERILE WATER FOR INJECTION IJ SOLN
INTRAMUSCULAR | Status: AC
  Filled 2014-08-10: qty 10

## 2014-08-10 MED ORDER — HYDROMORPHONE HCL 1 MG/ML IJ SOLN
0.2500 mg | INTRAMUSCULAR | Status: DC | PRN
  Administered 2014-08-10 (×3): 0.5 mg via INTRAVENOUS

## 2014-08-10 MED ORDER — SODIUM CHLORIDE 0.9 % IV SOLN
INTRAVENOUS | Status: DC | PRN
  Administered 2014-08-10: 07:00:00 via INTRAVENOUS

## 2014-08-10 MED ORDER — SODIUM CHLORIDE 0.9 % IV SOLN
INTRAVENOUS | Status: DC
Start: 2014-08-10 — End: 2014-08-10

## 2014-08-10 MED ORDER — EPHEDRINE SULFATE 50 MG/ML IJ SOLN
INTRAMUSCULAR | Status: AC
  Filled 2014-08-10: qty 1

## 2014-08-10 MED ORDER — CHLORHEXIDINE GLUCONATE CLOTH 2 % EX PADS: 6.0000 | MEDICATED_PAD | Freq: Once | CUTANEOUS | Status: DC

## 2014-08-10 MED ORDER — ONDANSETRON HCL 4 MG/2ML IJ SOLN
INTRAMUSCULAR | Status: DC | PRN
  Administered 2014-08-10: 4 mg via INTRAVENOUS

## 2014-08-10 MED ORDER — LIDOCAINE HCL (CARDIAC) 20 MG/ML IV SOLN
INTRAVENOUS | Status: DC | PRN
  Administered 2014-08-10: 70 mg via INTRAVENOUS

## 2014-08-10 MED ORDER — EPHEDRINE SULFATE 50 MG/ML IJ SOLN
INTRAMUSCULAR | Status: DC | PRN
  Administered 2014-08-10 (×4): 5 mg via INTRAVENOUS

## 2014-08-10 MED ORDER — ONDANSETRON HCL 4 MG/2ML IJ SOLN
INTRAMUSCULAR | Status: AC
  Filled 2014-08-10: qty 2

## 2014-08-10 MED ORDER — MEPERIDINE HCL 25 MG/ML IJ SOLN: 6.2500 mg | INTRAMUSCULAR | Status: DC | PRN

## 2014-08-10 MED ORDER — OXYCODONE-ACETAMINOPHEN 5-325 MG PO TABS
1.0000 | ORAL_TABLET | Freq: Once | ORAL | Status: AC
  Administered 2014-08-10: 1 via ORAL

## 2014-08-10 MED ORDER — FENTANYL CITRATE (PF) 250 MCG/5ML IJ SOLN
INTRAMUSCULAR | Status: AC
  Filled 2014-08-10: qty 5

## 2014-08-10 MED ORDER — MIDAZOLAM HCL 2 MG/2ML IJ SOLN
INTRAMUSCULAR | Status: AC
  Filled 2014-08-10: qty 2

## 2014-08-10 MED ORDER — HYDROMORPHONE HCL 1 MG/ML IJ SOLN
INTRAMUSCULAR | Status: AC
  Filled 2014-08-10: qty 1

## 2014-08-10 MED ORDER — FENTANYL CITRATE (PF) 100 MCG/2ML IJ SOLN
INTRAMUSCULAR | Status: DC | PRN
  Administered 2014-08-10: 25 ug via INTRAVENOUS
  Administered 2014-08-10: 50 ug via INTRAVENOUS
  Administered 2014-08-10 (×4): 25 ug via INTRAVENOUS

## 2014-08-10 MED ORDER — MIDAZOLAM HCL 5 MG/5ML IJ SOLN
INTRAMUSCULAR | Status: DC | PRN
  Administered 2014-08-10: 2 mg via INTRAVENOUS

## 2014-08-10 MED ORDER — ROCURONIUM BROMIDE 50 MG/5ML IV SOLN
INTRAVENOUS | Status: AC
  Filled 2014-08-10: qty 1

## 2014-08-10 MED ORDER — PROPOFOL 10 MG/ML IV BOLUS
INTRAVENOUS | Status: DC | PRN
  Administered 2014-08-10: 100 mg via INTRAVENOUS
  Administered 2014-08-10: 40 mg via INTRAVENOUS

## 2014-08-10 MED ORDER — PHENYLEPHRINE 40 MCG/ML (10ML) SYRINGE FOR IV PUSH (FOR BLOOD PRESSURE SUPPORT)
PREFILLED_SYRINGE | INTRAVENOUS | Status: AC
  Filled 2014-08-10: qty 20

## 2014-08-10 MED ORDER — 0.9 % SODIUM CHLORIDE (POUR BTL) OPTIME
TOPICAL | Status: DC | PRN
  Administered 2014-08-10: 1000 mL

## 2014-08-10 MED ORDER — OXYCODONE-ACETAMINOPHEN 5-325 MG PO TABS: 1.0000 | ORAL_TABLET | Freq: Three times a day (TID) | ORAL | Status: DC | PRN

## 2014-08-10 MED ORDER — LIDOCAINE-EPINEPHRINE (PF) 1 %-1:200000 IJ SOLN
INTRAMUSCULAR | Status: AC
  Filled 2014-08-10: qty 10

## 2014-08-10 MED ORDER — OXYCODONE-ACETAMINOPHEN 5-325 MG PO TABS
ORAL_TABLET | ORAL | Status: AC
  Filled 2014-08-10: qty 1

## 2014-08-10 MED ORDER — PROMETHAZINE HCL 25 MG/ML IJ SOLN
6.2500 mg | INTRAMUSCULAR | Status: DC | PRN
Start: 2014-08-10 — End: 2014-08-10

## 2014-08-10 SURGICAL SUPPLY — 43 items
ARMBAND PINK RESTRICT EXTREMIT (MISCELLANEOUS) ×2 IMPLANT
CANISTER SUCTION 2500CC (MISCELLANEOUS) ×2 IMPLANT
CLIP TI MEDIUM 6 (CLIP) ×3 IMPLANT
CLIP TI WIDE RED SMALL 6 (CLIP) ×4 IMPLANT
COVER PROBE W GEL 5X96 (DRAPES) IMPLANT
DRAIN PENROSE 1/4X12 LTX STRL (WOUND CARE) ×1 IMPLANT
ELECT REM PT RETURN 9FT ADLT (ELECTROSURGICAL) ×2
ELECTRODE REM PT RTRN 9FT ADLT (ELECTROSURGICAL) ×1 IMPLANT
GEL ULTRASOUND 20GR AQUASONIC (MISCELLANEOUS) IMPLANT
GLOVE BIO SURGEON STRL SZ 6.5 (GLOVE) ×2 IMPLANT
GLOVE BIOGEL PI IND STRL 6 (GLOVE) IMPLANT
GLOVE BIOGEL PI IND STRL 6.5 (GLOVE) IMPLANT
GLOVE BIOGEL PI IND STRL 7.0 (GLOVE) IMPLANT
GLOVE BIOGEL PI IND STRL 7.5 (GLOVE) IMPLANT
GLOVE BIOGEL PI INDICATOR 6 (GLOVE) ×1
GLOVE BIOGEL PI INDICATOR 6.5 (GLOVE) ×2
GLOVE BIOGEL PI INDICATOR 7.0 (GLOVE) ×4
GLOVE BIOGEL PI INDICATOR 7.5 (GLOVE) ×1
GLOVE ECLIPSE 6.5 STRL STRAW (GLOVE) ×2 IMPLANT
GLOVE SS BIOGEL STRL SZ 6.5 (GLOVE) IMPLANT
GLOVE SS BIOGEL STRL SZ 7 (GLOVE) ×1 IMPLANT
GLOVE SUPERSENSE BIOGEL SZ 6.5 (GLOVE) ×2
GLOVE SUPERSENSE BIOGEL SZ 7 (GLOVE) ×1
GLOVE SURG SS PI 7.0 STRL IVOR (GLOVE) ×1 IMPLANT
GOWN STRL REUS W/ TWL LRG LVL3 (GOWN DISPOSABLE) ×3 IMPLANT
GOWN STRL REUS W/TWL LRG LVL3 (GOWN DISPOSABLE) ×14
KIT BASIN OR (CUSTOM PROCEDURE TRAY) ×2 IMPLANT
KIT ROOM TURNOVER OR (KITS) ×2 IMPLANT
LIQUID BAND (GAUZE/BANDAGES/DRESSINGS) ×2 IMPLANT
NS IRRIG 1000ML POUR BTL (IV SOLUTION) ×2 IMPLANT
PACK CV ACCESS (CUSTOM PROCEDURE TRAY) ×2 IMPLANT
PAD ARMBOARD 7.5X6 YLW CONV (MISCELLANEOUS) ×4 IMPLANT
SPONGE LAP 18X18 X RAY DECT (DISPOSABLE) ×1 IMPLANT
SUT PROLENE 6 0 BV (SUTURE) ×2 IMPLANT
SUT SILK 2 0 FS (SUTURE) ×1 IMPLANT
SUT SILK 4 0 (SUTURE) ×2
SUT SILK 4-0 18XBRD TIE 12 (SUTURE) IMPLANT
SUT VIC AB 3-0 SH 27 (SUTURE) ×6
SUT VIC AB 3-0 SH 27X BRD (SUTURE) ×1 IMPLANT
SUT VICRYL 4-0 PS2 18IN ABS (SUTURE) ×1 IMPLANT
TUBE CONNECTING 12X1/4 (SUCTIONS) ×1 IMPLANT
UNDERPAD 30X30 INCONTINENT (UNDERPADS AND DIAPERS) ×2 IMPLANT
WATER STERILE IRR 1000ML POUR (IV SOLUTION) ×2 IMPLANT

## 2014-08-10 NOTE — Interval H&P Note (Signed)
History and Physical Interval Note:  08/10/2014 7:29 AM  James Wall  has presented today for surgery, with the diagnosis of End Stage Renal Disease N18.6  The various methods of treatment have been discussed with the patient and family. After consideration of risks, benefits and other options for treatment, the patient has consented to  Procedure(s): BASILIC VEIN ARTERIOVENOUS (AV) FISTULA CREATION VERSUS GRAFT INSERTION (Left) as a surgical intervention .  The patient's history has been reviewed, patient examined, no change in status, stable for surgery.  I have reviewed the patient's chart and labs.  Questions were answered to the patient's satisfaction.     [name]

## 2014-08-10 NOTE — Anesthesia Procedure Notes (Signed)
Procedure Name: LMA Insertion Date/Time: 08/10/2014 7:46 AM Performed by: Garrison Columbus T Pre-anesthesia Checklist: Patient identified, Emergency Drugs available, Suction available and Patient being monitored Patient Re-evaluated:Patient Re-evaluated prior to inductionOxygen Delivery Method: Circle system utilized Preoxygenation: Pre-oxygenation with 100% oxygen Intubation Type: IV induction LMA: LMA inserted LMA Size: 4.0 Number of attempts: 1 Placement Confirmation: positive ETCO2 and breath sounds checked- equal and bilateral Tube secured with: Tape Dental Injury: Teeth and Oropharynx as per pre-operative assessment

## 2014-08-10 NOTE — Transfer of Care (Signed)
Immediate Anesthesia Transfer of Care Note  Patient: James Wall  Procedure(s) Performed: Procedure(s): BASILIC VEIN TRANSPOSITION  ARTERIOVENOUS (AV) FISTULA CREATION LEFT UPPER ARM (Left)  Patient Location: PACU  Anesthesia Type:General  Level of Consciousness: awake, alert  and oriented  Airway & Oxygen Therapy: Patient Spontanous Breathing and Patient connected to nasal cannula oxygen  Post-op Assessment: Report given to RN, Post -op Vital signs reviewed and stable and Patient moving all extremities X 4  Post vital signs: Reviewed and stable  Last Vitals:  Filed Vitals:   08/10/14 0629  BP: 153/96  Pulse: 59  Temp: 36.3 C  Resp: 18    Complications: No apparent anesthesia complications

## 2014-08-10 NOTE — Anesthesia Postprocedure Evaluation (Signed)
Anesthesia Post Note  Patient: James Wall  Procedure(s) Performed: Procedure(s) (LRB): BASILIC VEIN TRANSPOSITION  ARTERIOVENOUS (AV) FISTULA CREATION LEFT UPPER ARM (Left)  Anesthesia type: General  Patient location: PACU  Post pain: Pain level controlled  Post assessment: Post-op Vital signs reviewed  Last Vitals: BP 163/93 mmHg  Pulse 54  Temp(Src) 36.7 C (Oral)  Resp 22  Ht 5' 6"  (1.676 m)  Wt 164 lb (74.39 kg)  BMI 26.48 kg/m2  SpO2 100%  Post vital signs: Reviewed  Level of consciousness: sedated  Complications: No apparent anesthesia complications

## 2014-08-10 NOTE — Discharge Instructions (Signed)
° ° °  08/10/2014 James Wall [id number] 11-Nov-1965  Surgeon(s): Mal Misty, MD  Procedure(s): BASILIC VEIN TRANSPOSITION  ARTERIOVENOUS (AV) FISTULA CREATION LEFT UPPER ARM  x Do not stick fistula for 12 weeks

## 2014-08-10 NOTE — H&P (View-Only) (Signed)
Patient name: James Wall MRN: [id number] DOB: 06-Dec-1965 Sex: male   Referred by: Moshe Cipro  Reason for referral:  Chief Complaint  Patient presents with  . Re-evaluation    eval for new access    HISTORY OF PRESENT ILLNESS: Patient resisted today for discussion of hemodialysis access. He has a long history of hemodialysis. He most recently had the dialysis via a right upper arm brachiocephalic fistula was placed by Dr. [name] in January 2015. He recently had thrombosis of this and underwent attempted percutaneous treatment and interventional radiology. I have reviewed the images from this study. He had successful appearance of the fistula. Unfortunately he had a very large dilatation of the proximal 1/3-1/2 and had an extremely atretic cephalic vein from mid upper arm to his cephalic to subclavian junction. This is occluded immediately. He does have hemodialysis via a left IJ catheter and is here for discussion of new permanent access. He did have a prior left upper arm brachiocephalic fistula that he reports functioned for approximately 6 years. He reports that he takes care of his 50 year old mother independently with little assistance and is quite concerned regarding the impact of surgery and his ability to take care of her. He also adamantly does not want lower extremity access.  Past Medical History  Diagnosis Date  . ESRD on hemodialysis     Started HD Jan 2009.  ESRD was due to HTN.  Dx'd with HTN in hospital 1996 according to pt, they had to keep him so he could get Medicaid to afford the BP medications.  First saw a nephrologist and started HD in the same year 2009.  Gets HD at [facility] on a MWF schedule.  Does not have DM. He had a left RC AVF that never functioned, a left upper arm AVF that worked for about 5 years and as of June  . Hypertension   . COPD (chronic obstructive pulmonary disease)   . Arthritis     GOUT  . Anemia   . Secondary hyperparathyroidism     . Anxiety   . Depression   . Shortness of breath     With exertion  . GERD (gastroesophageal reflux disease)   . Sepsis 02/2013    from AVF , treated with Vancomycin.  . Renal insufficiency     Past Surgical History  Procedure Laterality Date  . Av fistula placement  2009    Left lower arm AVF  . Ligation of arteriovenous  fistula Left 12/22/2012    Procedure: LIGATION OF ARTERIOVENOUS  FISTULA;EXCISION OF LARGE ANEURYSMS;;  Surgeon: Elam Dutch, MD;  Location: [place];  Service: Vascular;  Laterality: Left;  . Insertion of dialysis catheter N/A 12/23/2012    Procedure: INSERTION OF DIALYSIS CATHETER; ULTRASOUND GUIDED;  Surgeon: Angelia Mould, MD;  Location: [place];  Service: Vascular;  Laterality: N/A;  . Av fistula placement Right 02/22/2013    Procedure:  CREATION  OF BRACHIAL CEPHALIC FISTULA RIGHT ARM;  Surgeon: Elam Dutch, MD;  Location: [place];  Service: Vascular;  Laterality: Right;  . Esophagogastroduodenoscopy (egd) with propofol N/A 04/12/2013    Procedure: ESOPHAGOGASTRODUODENOSCOPY (EGD) WITH PROPOFOL;  Surgeon: Arta Silence, MD;  Location: [facility];  Service: Endoscopy;  Laterality: N/A;  . Left heart catheterization with coronary angiogram N/A 07/13/2013    Procedure: LEFT HEART CATHETERIZATION WITH CORONARY ANGIOGRAM;  Surgeon: Jettie Booze, MD;  Location[facility] CATH LAB;  Service: Cardiovascular;  Laterality: N/A;  History   Social History  . Marital Status: Single    Spouse Name: N/A  . Number of Children: 0  . Years of Education: 12th   Occupational History  . n/a    Social History Main Topics  . Smoking status: Current Every Day Smoker -- 0.50 packs/day for 16 years    Types: Cigarettes  . Smokeless tobacco: Never Used  . Alcohol Use: 0.0 oz/week    0 Standard drinks or equivalent per week     Comment: occassional  . Drug Use: No  . Sexual Activity: Not on file   Other Topics Concern  . Not on file   Social History  Narrative    Family History  Problem Relation Age of Onset  . Cerebrovascular Accident Father   . Hypertension Father   . Hypertension Mother   . Congestive Heart Failure Brother   . Asthma Brother     Allergies as of 07/25/2014 - Review Complete 07/25/2014  Allergen Reaction Noted  . Aspirin  04/06/2008    Current Outpatient Prescriptions on File Prior to Visit  Medication Sig Dispense Refill  . ALPRAZolam (XANAX) 1 MG tablet Take 1 mg by mouth at bedtime as needed for anxiety.    Marland Kitchen amLODipine (NORVASC) 10 MG tablet Take 10 mg by mouth at bedtime.     Marland Kitchen atorvastatin (LIPITOR) 20 MG tablet Take 1 tablet (20 mg total) by mouth daily. 90 tablet 3  . cloNIDine (CATAPRES - DOSED IN MG/24 HR) 0.3 mg/24hr Place 1 patch onto the skin once a week. Saturday.    . esomeprazole (NEXIUM) 40 MG capsule Take 40-80 mg by mouth daily as needed (reflux/indigestion. Dose varies depending on severity.).     Marland Kitchen lisinopril (PRINIVIL,ZESTRIL) 40 MG tablet Take 40 mg by mouth at bedtime.     . metoprolol succinate (TOPROL XL) 50 MG 24 hr tablet Take 1 tablet (50 mg total) by mouth daily. Take with or immediately following a meal. 90 tablet 1  . multivitamin (RENA-VIT) TABS tablet Take 1 tablet by mouth at bedtime. 30 each 0   No current facility-administered medications on file prior to visit.       PHYSICAL EXAMINATION:  General: The patient is a well-nourished male, in no acute distress. Vital signs are BP 188/100 mmHg  Pulse 67  Resp 18  Ht 5' 6"  (1.676 m)  Wt 161 lb (73.029 kg)  BMI 26.00 kg/m2 Pulmonary: There is a good air exchange  . Musculoskeletal: There are no major deformities.  There is no significant extremity pain. Neurologic: No focal weakness or paresthesias are detected, Skin: There are no ulcer or rashes noted. Psychiatric: The patient has normal affect. Cardiovascular: 2+ radial pulses bilaterally. Has scarring from an old left upper arm AV fistula also from a failed left  wrist Cimino fistula. 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   [facility] Vascular Lab Studies:  Ordered and Independently Reviewed studies from 07/20/2014 were discussed with the patient. He does have adequate arterial flow bilaterally. He does have moderate size basilic veins on the left some thrombosis of the basilic vein on the right.  Impression and Plan:  Had long discussion with patient regarding accessed options. Explained that he had better than typical experience on his first fistula left arm and more typical experience on the right arm. I have recommended exploration of his basilic vein but fillet he would be a good candidate for basilic vein fistula. Explained  that this is typically done in 2 stages but if he had a very large basilic vein could potentially be done in one stage with transposition at the time of initial surgery. Also explained that he may not have an adequate vein in that require a left arm prostatic Gore-Tex graft. This decision will be made at the time of surgery. He dialyzes on Monday Wednesday and Friday. I explained that I am in the office on Tuesday and Thursday and therefore would be one of my partners during the surgery. He is comfortable with this and will be scheduled as soon as possible    Abednego Yeates [facility] [place] Office: [phone]

## 2014-08-10 NOTE — Anesthesia Preprocedure Evaluation (Addendum)
Anesthesia Evaluation  Patient identified by MRN, date of birth, ID band Patient awake    Reviewed: Allergy & Precautions, NPO status , Patient's Chart, lab work & pertinent test results, reviewed documented beta blocker date and time   Airway Mallampati: II  TM Distance: >3 FB Neck ROM: Full    Dental no notable dental hx. (+) Dental Advisory Given   Pulmonary shortness of breath and with exertion, asthma , sleep apnea , COPDCurrent Smoker,  breath sounds clear to auscultation        Cardiovascular hypertension, Pt. on medications and Pt. on home beta blockers Rhythm:Regular Rate:Normal     Neuro/Psych PSYCHIATRIC DISORDERS Anxiety Depression    GI/Hepatic GERD-  Medicated,  Endo/Other    Renal/GU ESRF and DialysisRenal disease     Musculoskeletal  (+) Arthritis -,   Abdominal   Peds  Hematology   Anesthesia Other Findings   Reproductive/Obstetrics                           Anesthesia Physical Anesthesia Plan  ASA: III  Anesthesia Plan: General   Post-op Pain Management:    Induction: Intravenous  Airway Management Planned: LMA  Additional Equipment: None  Intra-op Plan:   Post-operative Plan: Extubation in OR  Informed Consent: I have reviewed the patients History and Physical, chart, labs and discussed the procedure including the risks, benefits and alternatives for the proposed anesthesia with the patient or authorized representative who has indicated his/her understanding and acceptance.   Dental advisory given  Plan Discussed with: CRNA  Anesthesia Plan Comments:       Anesthesia Quick Evaluation                                  Anesthesia Evaluation  Patient identified by MRN, date of birth, ID band Patient awake    Reviewed: Allergy & Precautions, H&P , NPO status , Patient's Chart, lab work & pertinent test results  History of Anesthesia  Complications Negative for: history of anesthetic complications  Airway Mallampati: II TM Distance: >3 FB Neck ROM: Full    Dental  (+) Teeth Intact   Pulmonary shortness of breath and with exertion, sleep apnea and Continuous Positive Airway Pressure Ventilation , Current Smoker,    Pulmonary exam normal       Cardiovascular Exercise Tolerance: Poor hypertension, Pt. on medications - angina- Past MI and - DOE Rhythm:Regular Rate:Normal     Neuro/Psych Anxiety Depression negative neurological ROS     GI/Hepatic Neg liver ROS, GERD-  Medicated and Controlled,  Endo/Other  negative endocrine ROS  Renal/GU Dialysis and CRFRenal disease     Musculoskeletal negative musculoskeletal ROS (+)   Abdominal   Peds  Hematology  (+) anemia ,   Anesthesia Other Findings   Reproductive/Obstetrics                          Anesthesia Physical Anesthesia Plan  ASA: III  Anesthesia Plan: General   Post-op Pain Management:    Induction: Intravenous  Airway Management Planned: LMA  Additional Equipment: None  Intra-op Plan:   Post-operative Plan: Extubation in OR  Informed Consent:   Dental advisory given  Plan Discussed with: CRNA and Surgeon  Anesthesia Plan Comments:        Anesthesia Quick Evaluation  Anesthesia Evaluation  Patient identified by MRN, date of birth, ID band Patient awake    Reviewed: Allergy & Precautions, H&P , NPO status , Patient's Chart, lab work & pertinent test results  History of Anesthesia Complications Negative for: history of anesthetic complications  Airway Mallampati: II TM Distance: >3 FB Neck ROM: Full    Dental  (+) Teeth Intact   Pulmonary shortness of breath and with exertion, sleep apnea and Continuous Positive Airway Pressure Ventilation , Current Smoker,    Pulmonary exam normal       Cardiovascular Exercise Tolerance:  Poor hypertension, Pt. on medications - angina- Past MI and - DOE Rhythm:Regular Rate:Normal     Neuro/Psych Anxiety Depression negative neurological ROS     GI/Hepatic Neg liver ROS, GERD-  Medicated and Controlled,  Endo/Other  negative endocrine ROS  Renal/GU Dialysis and CRFRenal disease     Musculoskeletal negative musculoskeletal ROS (+)   Abdominal   Peds  Hematology  (+) anemia ,   Anesthesia Other Findings   Reproductive/Obstetrics                          Anesthesia Physical Anesthesia Plan  ASA: III  Anesthesia Plan: General   Post-op Pain Management:    Induction: Intravenous  Airway Management Planned: LMA  Additional Equipment: None  Intra-op Plan:   Post-operative Plan: Extubation in OR  Informed Consent:   Dental advisory given  Plan Discussed with: CRNA and Surgeon  Anesthesia Plan Comments:        Anesthesia Quick Evaluation                                   Anesthesia Evaluation  Patient identified by MRN, date of birth, ID band Patient awake    Reviewed: Allergy & Precautions, H&P , NPO status , Patient's Chart, lab work & pertinent test results  Airway Mallampati: II TM Distance: >3 FB Neck ROM: Full    Dental no notable dental hx.    Pulmonary shortness of breath, asthma , sleep apnea , COPDCurrent Smoker,  breath sounds clear to auscultation  Pulmonary exam normal       Cardiovascular Exercise Tolerance: Good hypertension, Pt. on medications Rhythm:Regular Rate:Normal     Neuro/Psych PSYCHIATRIC DISORDERS Anxiety Depression negative neurological ROS     GI/Hepatic Neg liver ROS, GERD-  Medicated,  Endo/Other  negative endocrine ROS  Renal/GU ESRFRenal disease  negative genitourinary   Musculoskeletal negative musculoskeletal ROS (+)   Abdominal   Peds negative pediatric ROS (+)  Hematology  (+) anemia ,   Anesthesia Other Findings    Reproductive/Obstetrics negative OB ROS                           Anesthesia Physical Anesthesia Plan  ASA: III  Anesthesia Plan: MAC   Post-op Pain Management:    Induction: Intravenous  Airway Management Planned:   Additional Equipment:   Intra-op Plan:   Post-operative Plan:   Informed Consent: I have reviewed the patients History and Physical, chart, labs and discussed the procedure including the risks, benefits and alternatives for the proposed anesthesia with the patient or authorized representative who has indicated his/her understanding and acceptance.   Dental advisory given  Plan Discussed with: CRNA  Anesthesia Plan Comments:         Anesthesia Quick Evaluation

## 2014-08-10 NOTE — Op Note (Signed)
OPERATIVE REPORT  Date of Surgery: 08/10/2014  Surgeon: [name], MD  Assistant: Dionicio Stall  Pre-op Diagnosis: End Stage Renal Disease N18.6  Post-op Diagnosis: End Stage Renal Disease N18.6  Procedure: Procedure(s): BASILIC VEIN TRANSPOSITION  ARTERIOVENOUS (AV) FISTULA CREATION LEFT UPPER ARM  Anesthesia: LMA  EBL: Minimal  Complications: None  Procedure Details: The patient was taken to the operating room placed in supine position at which time satisfactory general LMA anesthesia was a Company secretary. Left upper extremity was then imaged using B mode ultrasound-sono site and the basilic vein was marked. It did appear adequate for basilic vein transposition. Left upper extremity was prepped Betadine scrub and solution draped in routine sterile manner. Basilic vein was then mobilized through 3 incisions on the medial aspect of the left arm beginning in the antecubital area extending up to the eczema. It was a satisfactory vein its branches were ligated with 3 and 4-0 silk ties and divided with gently dilated with heparinized saline and marked for orientation purposes. It was at least 3 mm in size throughout. Through the distal upper arm incision the brachial artery was exposed and circled with Vesseloops. It was an excellent vessel with about 4 mm in size. Curvilinear tunnel was then created on the anterior aspect of the arm and the basilic vein after ligating it distally and transecting it was delivered into the proximal wound and then tunneled along the anterior aspect of the arm into the distal wound where the brachial artery had been exposed. Brachial artery was occluded proximally and distally on 15 blade extending with Potts scissors. It was excellent flow. Vein was carefully measured spatulated and anastomosed end to side with 60 proline. Clamps released nose excellent pulse and thrill in the fistula. There was audible radial arterial flow distally which did diminish slightly with  the fistula open but did not disappear. Adequate hemostasis was achieved and wounds closed in layers with 5-0 subcuticular fashion with Dermabond patient taken to recovery in satisfactory condition   [name], MD 08/10/2014 9:40 AM

## 2014-08-11 ENCOUNTER — Encounter (HOSPITAL_COMMUNITY): Payer: Self-pay | Admitting: Vascular Surgery

## 2014-08-11 ENCOUNTER — Telehealth: Payer: Self-pay | Admitting: Vascular Surgery

## 2014-08-11 DIAGNOSIS — T8249XD Other complication of vascular dialysis catheter, subsequent encounter: Secondary | ICD-10-CM | POA: Diagnosis not present

## 2014-08-11 DIAGNOSIS — N186 End stage renal disease: Secondary | ICD-10-CM | POA: Diagnosis not present

## 2014-08-11 DIAGNOSIS — E119 Type 2 diabetes mellitus without complications: Secondary | ICD-10-CM | POA: Diagnosis not present

## 2014-08-11 DIAGNOSIS — N2581 Secondary hyperparathyroidism of renal origin: Secondary | ICD-10-CM | POA: Diagnosis not present

## 2014-08-11 DIAGNOSIS — D509 Iron deficiency anemia, unspecified: Secondary | ICD-10-CM | POA: Diagnosis not present

## 2014-08-11 DIAGNOSIS — Z4931 Encounter for adequacy testing for hemodialysis: Secondary | ICD-10-CM | POA: Diagnosis not present

## 2014-08-11 NOTE — Telephone Encounter (Addendum)
-----   Message from Mena Goes, RN sent at 08/10/2014  9:52 AM EDT ----- Regarding: Schedule   ----- Message -----    From: Gabriel Earing, PA-C    Sent: 08/10/2014   9:33 AM      To: [facility] Charge Pool  S/p left BVT 08/10/14.  F/u with Dr. Kellie Simmering in 6 weeks. Does not need duplex.  Thanks, Samantha  notified patient of post op appt. with on 09-26-14 with dr. Kellie Simmering

## 2014-08-14 DIAGNOSIS — T8249XD Other complication of vascular dialysis catheter, subsequent encounter: Secondary | ICD-10-CM | POA: Diagnosis not present

## 2014-08-14 DIAGNOSIS — Z4931 Encounter for adequacy testing for hemodialysis: Secondary | ICD-10-CM | POA: Diagnosis not present

## 2014-08-14 DIAGNOSIS — N2581 Secondary hyperparathyroidism of renal origin: Secondary | ICD-10-CM | POA: Diagnosis not present

## 2014-08-14 DIAGNOSIS — D509 Iron deficiency anemia, unspecified: Secondary | ICD-10-CM | POA: Diagnosis not present

## 2014-08-14 DIAGNOSIS — N186 End stage renal disease: Secondary | ICD-10-CM | POA: Diagnosis not present

## 2014-08-14 DIAGNOSIS — E119 Type 2 diabetes mellitus without complications: Secondary | ICD-10-CM | POA: Diagnosis not present

## 2014-08-16 DIAGNOSIS — D509 Iron deficiency anemia, unspecified: Secondary | ICD-10-CM | POA: Diagnosis not present

## 2014-08-16 DIAGNOSIS — N2581 Secondary hyperparathyroidism of renal origin: Secondary | ICD-10-CM | POA: Diagnosis not present

## 2014-08-16 DIAGNOSIS — E119 Type 2 diabetes mellitus without complications: Secondary | ICD-10-CM | POA: Diagnosis not present

## 2014-08-16 DIAGNOSIS — Z4931 Encounter for adequacy testing for hemodialysis: Secondary | ICD-10-CM | POA: Diagnosis not present

## 2014-08-16 DIAGNOSIS — T8249XD Other complication of vascular dialysis catheter, subsequent encounter: Secondary | ICD-10-CM | POA: Diagnosis not present

## 2014-08-16 DIAGNOSIS — N186 End stage renal disease: Secondary | ICD-10-CM | POA: Diagnosis not present

## 2014-08-18 DIAGNOSIS — N2581 Secondary hyperparathyroidism of renal origin: Secondary | ICD-10-CM | POA: Diagnosis not present

## 2014-08-18 DIAGNOSIS — Z4931 Encounter for adequacy testing for hemodialysis: Secondary | ICD-10-CM | POA: Diagnosis not present

## 2014-08-18 DIAGNOSIS — D509 Iron deficiency anemia, unspecified: Secondary | ICD-10-CM | POA: Diagnosis not present

## 2014-08-18 DIAGNOSIS — T8249XD Other complication of vascular dialysis catheter, subsequent encounter: Secondary | ICD-10-CM | POA: Diagnosis not present

## 2014-08-18 DIAGNOSIS — N186 End stage renal disease: Secondary | ICD-10-CM | POA: Diagnosis not present

## 2014-08-18 DIAGNOSIS — E119 Type 2 diabetes mellitus without complications: Secondary | ICD-10-CM | POA: Diagnosis not present

## 2014-08-21 DIAGNOSIS — E119 Type 2 diabetes mellitus without complications: Secondary | ICD-10-CM | POA: Diagnosis not present

## 2014-08-21 DIAGNOSIS — D509 Iron deficiency anemia, unspecified: Secondary | ICD-10-CM | POA: Diagnosis not present

## 2014-08-21 DIAGNOSIS — T8249XD Other complication of vascular dialysis catheter, subsequent encounter: Secondary | ICD-10-CM | POA: Diagnosis not present

## 2014-08-21 DIAGNOSIS — Z4931 Encounter for adequacy testing for hemodialysis: Secondary | ICD-10-CM | POA: Diagnosis not present

## 2014-08-21 DIAGNOSIS — N2581 Secondary hyperparathyroidism of renal origin: Secondary | ICD-10-CM | POA: Diagnosis not present

## 2014-08-21 DIAGNOSIS — N186 End stage renal disease: Secondary | ICD-10-CM | POA: Diagnosis not present

## 2014-08-23 DIAGNOSIS — N2581 Secondary hyperparathyroidism of renal origin: Secondary | ICD-10-CM | POA: Diagnosis not present

## 2014-08-23 DIAGNOSIS — N186 End stage renal disease: Secondary | ICD-10-CM | POA: Diagnosis not present

## 2014-08-23 DIAGNOSIS — T8249XD Other complication of vascular dialysis catheter, subsequent encounter: Secondary | ICD-10-CM | POA: Diagnosis not present

## 2014-08-23 DIAGNOSIS — Z4931 Encounter for adequacy testing for hemodialysis: Secondary | ICD-10-CM | POA: Diagnosis not present

## 2014-08-23 DIAGNOSIS — D509 Iron deficiency anemia, unspecified: Secondary | ICD-10-CM | POA: Diagnosis not present

## 2014-08-23 DIAGNOSIS — E119 Type 2 diabetes mellitus without complications: Secondary | ICD-10-CM | POA: Diagnosis not present

## 2014-08-25 DIAGNOSIS — Z4931 Encounter for adequacy testing for hemodialysis: Secondary | ICD-10-CM | POA: Diagnosis not present

## 2014-08-25 DIAGNOSIS — T8249XD Other complication of vascular dialysis catheter, subsequent encounter: Secondary | ICD-10-CM | POA: Diagnosis not present

## 2014-08-25 DIAGNOSIS — D509 Iron deficiency anemia, unspecified: Secondary | ICD-10-CM | POA: Diagnosis not present

## 2014-08-25 DIAGNOSIS — E119 Type 2 diabetes mellitus without complications: Secondary | ICD-10-CM | POA: Diagnosis not present

## 2014-08-25 DIAGNOSIS — N2581 Secondary hyperparathyroidism of renal origin: Secondary | ICD-10-CM | POA: Diagnosis not present

## 2014-08-25 DIAGNOSIS — N186 End stage renal disease: Secondary | ICD-10-CM | POA: Diagnosis not present

## 2014-08-28 ENCOUNTER — Telehealth: Payer: Self-pay

## 2014-08-28 DIAGNOSIS — Z4931 Encounter for adequacy testing for hemodialysis: Secondary | ICD-10-CM | POA: Diagnosis not present

## 2014-08-28 DIAGNOSIS — D509 Iron deficiency anemia, unspecified: Secondary | ICD-10-CM | POA: Diagnosis not present

## 2014-08-28 DIAGNOSIS — N2581 Secondary hyperparathyroidism of renal origin: Secondary | ICD-10-CM | POA: Diagnosis not present

## 2014-08-28 DIAGNOSIS — E119 Type 2 diabetes mellitus without complications: Secondary | ICD-10-CM | POA: Diagnosis not present

## 2014-08-28 DIAGNOSIS — N186 End stage renal disease: Secondary | ICD-10-CM | POA: Diagnosis not present

## 2014-08-28 DIAGNOSIS — T8249XD Other complication of vascular dialysis catheter, subsequent encounter: Secondary | ICD-10-CM | POA: Diagnosis not present

## 2014-08-28 NOTE — Telephone Encounter (Signed)
Phone call from nurse at the [facility].  Reported the left arm surgical site is "inflammed, warm, and swollen."   Reported it was advised to give him IV antibiotics today, but pt. refused to stay and receive the antibiotic.  Also reported that the pt. refused to make an appt. with [facility] to have his arm checked.   Phone call to pt.  Questioned about symptoms.  Stated he does not think his fistula is infected.    Stated he has intermittent "sharp nerve pains" through the fistula site.  Admitted to redness @ site.  Denied any drainage from incision.  Stated his left hand stays cold all the time, and he has difficulty gripping with the left hand.  Stated there is a lot of swelling in his left hand fingers.  Reported the swelling in the arm has improved a little bit, over past 2 weeks.  Stated he felt the nurses at dialysis don't listen to him, and that he tried to tell them the arm is not infected.  Advised to schedule an appt. to have the site checked for infection, and to evaluate for steal syndrome.  Agreed with plan.  Stated he will only come in for an early morning appt., due to the heat, and not having air conditioning in his car.  Appt. given for 9:00 AM with the Nurse Practitioner.  Informed the pt. that Dr. Kellie Simmering will be available for consultation if needed.  Agreed.

## 2014-08-29 ENCOUNTER — Encounter: Payer: Self-pay | Admitting: Family

## 2014-08-29 ENCOUNTER — Ambulatory Visit (INDEPENDENT_AMBULATORY_CARE_PROVIDER_SITE_OTHER): Payer: Self-pay | Admitting: Family

## 2014-08-29 VITALS — BP 151/97 | HR 88 | Temp 97.9°F | Ht 66.0 in | Wt 169.6 lb

## 2014-08-29 DIAGNOSIS — F17200 Nicotine dependence, unspecified, uncomplicated: Secondary | ICD-10-CM

## 2014-08-29 DIAGNOSIS — T829XXA Unspecified complication of cardiac and vascular prosthetic device, implant and graft, initial encounter: Secondary | ICD-10-CM

## 2014-08-29 DIAGNOSIS — N186 End stage renal disease: Secondary | ICD-10-CM

## 2014-08-29 DIAGNOSIS — Z992 Dependence on renal dialysis: Secondary | ICD-10-CM

## 2014-08-29 NOTE — Progress Notes (Signed)
Established Dialysis Access  History of Present Illness  James Wall is a 49 y.o. (January 08, 1966) male patient of Dr. [name] and Dr. Donnetta Hutching. On 08/10/14 Dr. Kellie Simmering performed a basilic vein transposition, AV fistula creation left upper arm. Pt has a long history of hemodialysis. He previously had dialysis via a right upper arm brachiocephalic fistula placed by Dr. [name] in January 2015. He had thrombosis of this and underwent attempted percutaneous treatment and interventional radiology. He had successful appearance of the fistula. Unfortunately he had a very large dilatation of the proximal 1/3-1/2 and had an extremely atretic cephalic vein from mid upper arm to his cephalic to subclavian junction. This occluded immediately. He does have hemodialysis via a left IJ catheter. He did have a prior left upper arm brachiocephalic fistula that he reports functioned for approximately 6 years. He reports that he takes care of his elderly mother independently with little assistance.Marland Kitchen He also adamantly does not want lower extremity access.  Dr. Donnetta Hutching saw pt on 07/25/14. At that time Dr. Donnetta Hutching reviewed studies from 07/20/2014  with the patient. He did have adequate arterial flow bilaterally. He had moderate size basilic veins on the left, some thrombosis of the basilic vein on the right.  He returns today due to [place] concerns re his AVF: reported swelling, inflammation.  Pt states his dry weight is the same as his pre dialysis weight is the same as his post weight.  Pt states his left arm cramps during dialysis. He has family and care giver issues with his mother.  He has been elevating left arm to reduce the swelling. Pt states this is his third or 4th HD access surgery and does not think it is infected, but states the home health nurse states it may be infected. He states that the swelling in his left arm and hand has decreased; pt denies feeing of warmth in left arm but states the [place] states his left arm feels warm.  Pt denies fever or chills, denies drainage from left UE AVF incisions.  He is dialyzed M-W-F via a left upper chest temporary catheter. Pt denies pain or cold feeling in left arm/hand, he does report some occasional tingling in his left hand   Past Medical History  Diagnosis Date  . ESRD on hemodialysis     Started HD Jan 2009.  ESRD was due to HTN.  Dx'd with HTN in hospital 1996 according to pt, they had to keep him so he could get Medicaid to afford the BP medications.  First saw a nephrologist and started HD in the same year 2009.  Gets HD at [facility] on a MWF schedule.  Does not have DM. He had a left RC AVF that never functioned, a left upper arm AVF that worked for about 5 years and as of June  . Hypertension   . COPD (chronic obstructive pulmonary disease)   . Anemia   . Secondary hyperparathyroidism   . Anxiety   . Depression   . Shortness of breath     With exertion  . GERD (gastroesophageal reflux disease)   . Sepsis 02/2013    from AVF , treated with Vancomycin.  . Renal insufficiency   . Sleep apnea     no  longer using cpap  . 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 Cirrhosis, nonalcoholic     Social History History  Substance Use Topics  .  Smoking status: Current Every Day Smoker -- 0.50 packs/day for 16 years    Types: Cigarettes  . Smokeless tobacco: Never Used  . Alcohol Use: 0.0 oz/week    0 Standard drinks or equivalent per week     Comment: occassional    Family History Family History  Problem Relation Age of Onset  . Cerebrovascular Accident Father   . Hypertension Father   . Hypertension Mother   . Congestive Heart Failure Brother   . Asthma Brother     Surgical History Past Surgical History  Procedure Laterality Date  . Av fistula placement  2009    Left lower arm AVF  . Ligation of arteriovenous  fistula Left 12/22/2012    Procedure: LIGATION OF ARTERIOVENOUS  FISTULA;EXCISION OF LARGE  ANEURYSMS;;  Surgeon: Elam Dutch, MD;  Location: [place];  Service: Vascular;  Laterality: Left;  . Insertion of dialysis catheter N/A 12/23/2012    Procedure: INSERTION OF DIALYSIS CATHETER; ULTRASOUND GUIDED;  Surgeon: Angelia Mould, MD;  Location: [place];  Service: Vascular;  Laterality: N/A;  . Av fistula placement Right 02/22/2013    Procedure:  CREATION  OF BRACHIAL CEPHALIC FISTULA RIGHT ARM;  Surgeon: Elam Dutch, MD;  Location: [place];  Service: Vascular;  Laterality: Right;  . Esophagogastroduodenoscopy (egd) with propofol N/A 04/12/2013    Procedure: ESOPHAGOGASTRODUODENOSCOPY (EGD) WITH PROPOFOL;  Surgeon: Arta Silence, MD;  Location: [facility];  Service: Endoscopy;  Laterality: N/A;  . Left heart catheterization with coronary angiogram N/A 07/13/2013    Procedure: LEFT HEART CATHETERIZATION WITH CORONARY ANGIOGRAM;  Surgeon: Jettie Booze, MD;  Location: [facility] CATH LAB;  Service: Cardiovascular;  Laterality: N/A;  . Colonoscopy    . Av fistula placement Left 08/10/2014    Procedure: BASILIC VEIN TRANSPOSITION  ARTERIOVENOUS (AV) FISTULA CREATION LEFT UPPER ARM;  Surgeon: Mal Misty, MD;  Location: [place];  Service: Vascular;  Laterality: Left;    Allergies  Allergen Reactions  . Aspirin     Tends to make him "feel worse" when he tries to take it for a cold or something    Current Outpatient Prescriptions  Medication Sig Dispense Refill  . ALPRAZolam (XANAX) 1 MG tablet Take 1 mg by mouth at bedtime as needed for anxiety.    Marland Kitchen amLODipine (NORVASC) 10 MG tablet Take 10 mg by mouth at bedtime.     Marland Kitchen atorvastatin (LIPITOR) 20 MG tablet Take 1 tablet (20 mg total) by mouth daily. 90 tablet 3  . cloNIDine (CATAPRES - DOSED IN MG/24 HR) 0.3 mg/24hr Place 1 patch onto the skin once a week. Saturday.    . esomeprazole (NEXIUM) 40 MG capsule Take 40-80 mg by mouth daily as needed (reflux/indigestion. Dose varies depending on severity.).     Marland Kitchen lisinopril  (PRINIVIL,ZESTRIL) 40 MG tablet Take 40 mg by mouth at bedtime.     . metoprolol succinate (TOPROL XL) 50 MG 24 hr tablet Take 1 tablet (50 mg total) by mouth daily. Take with or immediately following a meal. 90 tablet 1  . oxyCODONE-acetaminophen (PERCOCET/ROXICET) 5-325 MG per tablet Take 1 tablet by mouth every 8 (eight) hours as needed. pain 30 tablet 0  . sevelamer carbonate (RENVELA) 800 MG tablet Take 800 mg by mouth 3 (three) times daily with meals. Takes 5-6 pills with a big meal, 3-4 pills with small meals    . tamsulosin (FLOMAX) 0.4 MG CAPS capsule Take 0.4 mg by mouth daily.  0   No current  facility-administered medications for this visit.     REVIEW OF SYSTEMS: see HPI for pertinent positives and negatives    PHYSICAL EXAMINATION:  Filed Vitals:   08/29/14 0903 08/29/14 0909  BP: 176/112 151/97  Pulse: 88   Temp: 97.9 F (36.6 C)   TempSrc: Oral   Height: 5' 6"  (1.676 m)   Weight: 169 lb 9.6 oz (76.93 kg)   SpO2: 100%    Body mass index is 27.39 kg/(m^2).  General: The patient appears their stated age.   HEENT:  No gross abnormalities Pulmonary: Respirations are non-labored Abdomen: Soft and non-tender. Musculoskeletal: There are no major deformities.   Neurologic: No focal weakness or paresthesias are detected, Left hand grasp is 4/5, right hand grasp is 5/5. Skin: There are no ulcer or rashes noted. Psychiatric: The patient has normal affect. Cardiovascular: There is a regular rate and rhythm without significant murmur appreciated. Bilteral radial pulses are palpable. Left upper arm AVF has an audible bruit and palpable thrill. Left arm has moderate swelling. Left radial pulse is palpable.    Medical Decision Making  James Wall is a 49 y.o. male who is s/p  basilic vein transposition, AV fistula creation left upper arm on 08/10/14. Dr. Kellie Simmering spoke with and examined pt.  Return in 6 weeks to see Dr. Kellie Simmering; reschedule from already scheduled 6 weeks  follow up.    James Wall, Sharmon Leyden, RN, MSN, FNP-C [facility] Office: [phone]  08/29/2014, 9:19 AM  Clinic UJ:[id number]

## 2014-08-29 NOTE — Patient Instructions (Signed)
AV Fistula, Care After Refer to this sheet in the next few weeks. These instructions provide you with information on caring for yourself after your procedure. Your caregiver may also give you more specific instructions. Your treatment has been planned according to current medical practices, but problems sometimes occur. Call your caregiver if you have any problems or questions after your procedure. HOME CARE INSTRUCTIONS   Do not drive a car or take public transportation alone.  Do not drink alcohol.  Only take medicine that has been prescribed by your caregiver.  Do not sign important papers or make important decisions.  Have a responsible person with you.  Ask your caregiver to show you how to check your access at home for a vibration (called a "thrill") or for a sound (called a "bruit" pronounced brew-ee).  Your vein will need time to enlarge and mature so needles can be inserted for dialysis. Follow your caregiver's instructions about what you need to do to make this happen.  Keep dressings clean and dry.  Keep the arm elevated above your heart. Use a pillow.  Rest.  Use the arm as usual for all activities.  Have the stitches or tape closures removed in 10 to 14 days, or as directed by your caregiver.  Do not sleep or lie on the area of the fistula or that arm. This may decrease or stop the blood flow through your fistula.  Do not allow blood pressures to be taken on this arm.  Do not allow blood drawing to be done from the graft.  Do not wear tight clothing around the access site or on the arm.  Avoid lifting heavy objects with the arm that has the fistula.  Do not use creams or lotions over the access site. SEEK MEDICAL CARE IF:   You have a fever.  You have swelling around the fistula that gets worse, or you have new pain.  You have unusual bleeding at the fistula site or from any other area.  You have pus or other drainage at the fistula site.  You have skin  redness or red streaking on the skin around, above, or below the fistula site.  Your access site feels warm.  You have any flu-like symptoms. SEEK IMMEDIATE MEDICAL CARE IF:   You have pain, numbness, or an unusual pale skin on the hand or on the side of your fistula.  You have dizziness or weakness that you have not had before.  You have shortness of breath.  You have chest pain.  Your fistula disconnects or breaks, and there is bleeding that cannot be easily controlled. Call for local emergency medical help. Do not try to drive yourself to the hospital. MAKE SURE YOU  Understand these instructions.  Will watch your condition.  Will get help right away if you are not doing well or get worse. Document Released: 01/20/2005 Document Revised: 06/06/2013 Document Reviewed: 07/10/2010 [facility] Patient Information 2015 [place], [place]. This information is not intended to replace advice given to you by your health care provider. Make sure you discuss any questions you have with your health care provider.    Smoking Cessation Quitting smoking is important to your health and has many advantages. However, it is not always easy to quit since nicotine is a very addictive drug. Oftentimes, people try 3 times or more before being able to quit. This document explains the best ways for you to prepare to quit smoking. Quitting takes hard work and a lot of effort,  but you can do it. ADVANTAGES OF QUITTING SMOKING  You will live longer, feel better, and live better.  Your body will feel the impact of quitting smoking almost immediately.  Within 20 minutes, blood pressure decreases. Your pulse returns to its normal level.  After 8 hours, carbon monoxide levels in the blood return to normal. Your oxygen level increases.  After 24 hours, the chance of having a heart attack starts to decrease. Your breath, hair, and body stop smelling like smoke.  After 48 hours, damaged nerve endings begin to  recover. Your sense of taste and smell improve.  After 72 hours, the body is virtually free of nicotine. Your bronchial tubes relax and breathing becomes easier.  After 2 to 12 weeks, lungs can hold more air. Exercise becomes easier and circulation improves.  The risk of having a heart attack, stroke, cancer, or lung disease is greatly reduced.  After 1 year, the risk of coronary heart disease is cut in half.  After 5 years, the risk of stroke falls to the same as a nonsmoker.  After 10 years, the risk of lung cancer is cut in half and the risk of other cancers decreases significantly.  After 15 years, the risk of coronary heart disease drops, usually to the level of a nonsmoker.  If you are pregnant, quitting smoking will improve your chances of having a healthy baby.  The people you live with, especially any children, will be healthier.  You will have extra money to spend on things other than cigarettes. QUESTIONS TO THINK ABOUT BEFORE ATTEMPTING TO QUIT You may want to talk about your answers with your health care provider.  Why do you want to quit?  If you tried to quit in the past, what helped and what did not?  What will be the most difficult situations for you after you quit? How will you plan to handle them?  Who can help you through the tough times? Your family? Friends? A health care provider?  What pleasures do you get from smoking? What ways can you still get pleasure if you quit? Here are some questions to ask your health care provider:  How can you help me to be successful at quitting?  What medicine do you think would be best for me and how should I take it?  What should I do if I need more help?  What is smoking withdrawal like? How can I get information on withdrawal? GET READY  Set a quit date.  Change your environment by getting rid of all cigarettes, ashtrays, matches, and lighters in your home, car, or work. Do not let people smoke in your  home.  Review your past attempts to quit. Think about what worked and what did not. GET SUPPORT AND ENCOURAGEMENT You have a better chance of being successful if you have help. You can get support in many ways.  Tell your family, friends, and coworkers that you are going to quit and need their support. Ask them not to smoke around you.  Get individual, group, or telephone counseling and support. Programs are available at [employer] and health centers. Call your local health department for information about programs in your area.  Spiritual beliefs and practices may help some smokers quit.  Download a "quit meter" on your computer to keep track of quit statistics, such as how long you have gone without smoking, cigarettes not smoked, and money saved.  Get a self-help book about quitting smoking and staying off  tobacco. LEARN NEW SKILLS AND BEHAVIORS  Distract yourself from urges to smoke. Talk to someone, go for a walk, or occupy your time with a task.  Change your normal routine. Take a different route to work. Drink tea instead of coffee. Eat breakfast in a different place.  Reduce your stress. Take a hot bath, exercise, or read a book.  Plan something enjoyable to do every day. Reward yourself for not smoking.  Explore interactive web-based programs that specialize in helping you quit. GET MEDICINE AND USE IT CORRECTLY Medicines can help you stop smoking and decrease the urge to smoke. Combining medicine with the above behavioral methods and support can greatly increase your chances of successfully quitting smoking.  Nicotine replacement therapy helps deliver nicotine to your body without the negative effects and risks of smoking. Nicotine replacement therapy includes nicotine gum, lozenges, inhalers, nasal sprays, and skin patches. Some may be available over-the-counter and others require a prescription.  Antidepressant medicine helps people abstain from smoking, but how this  works is unknown. This medicine is available by prescription.  Nicotinic receptor partial agonist medicine simulates the effect of nicotine in your brain. This medicine is available by prescription. Ask your health care provider for advice about which medicines to use and how to use them based on your health history. Your health care provider will tell you what side effects to look out for if you choose to be on a medicine or therapy. Carefully read the information on the package. Do not use any other product containing nicotine while using a nicotine replacement product.  RELAPSE OR DIFFICULT SITUATIONS Most relapses occur within the first 3 months after quitting. Do not be discouraged if you start smoking again. Remember, most people try several times before finally quitting. You may have symptoms of withdrawal because your body is used to nicotine. You may crave cigarettes, be irritable, feel very hungry, cough often, get headaches, or have difficulty concentrating. The withdrawal symptoms are only temporary. They are strongest when you first quit, but they will go away within 10-14 days. To reduce the chances of relapse, try to:  Avoid drinking alcohol. Drinking lowers your chances of successfully quitting.  Reduce the amount of caffeine you consume. Once you quit smoking, the amount of caffeine in your body increases and can give you symptoms, such as a rapid heartbeat, sweating, and anxiety.  Avoid smokers because they can make you want to smoke.  Do not let weight gain distract you. Many smokers will gain weight when they quit, usually less than 10 pounds. Eat a healthy diet and stay active. You can always lose the weight gained after you quit.  Find ways to improve your mood other than smoking. FOR MORE INFORMATION  www.smokefree.gov  Document Released: 01/14/2001 Document Revised: 06/06/2013 Document Reviewed: 05/01/2011 [facility] Patient Information 2015 [place], [place]. This information  is not intended to replace advice given to you by your health care provider. Make sure you discuss any questions you have with your health care provider.   Smoking Cessation, Tips for Success If you are ready to quit smoking, congratulations! You have chosen to help yourself be healthier. Cigarettes bring nicotine, tar, carbon monoxide, and other irritants into your body. Your lungs, heart, and blood vessels will be able to work better without these poisons. There are many different ways to quit smoking. Nicotine gum, nicotine patches, a nicotine inhaler, or nicotine nasal spray can help with physical craving. Hypnosis, support groups, and medicines help break the habit  of smoking. WHAT THINGS CAN I DO TO MAKE QUITTING EASIER?  Here are some tips to help you quit for good:  Pick a date when you will quit smoking completely. Tell all of your friends and family about your plan to quit on that date.  Do not try to slowly cut down on the number of cigarettes you are smoking. Pick a quit date and quit smoking completely starting on that day.  Throw away all cigarettes.   Clean and remove all ashtrays from your home, work, and car.  On a card, write down your reasons for quitting. Carry the card with you and read it when you get the urge to smoke.  Cleanse your body of nicotine. Drink enough water and fluids to keep your urine clear or pale yellow. Do this after quitting to flush the nicotine from your body.  Learn to predict your moods. Do not let a bad situation be your excuse to have a cigarette. Some situations in your life might tempt you into wanting a cigarette.  Never have "just one" cigarette. It leads to wanting another and another. Remind yourself of your decision to quit.  Change habits associated with smoking. If you smoked while driving or when feeling stressed, try other activities to replace smoking. Stand up when drinking your coffee. Brush your teeth after eating. Sit in a  different chair when you read the paper. Avoid alcohol while trying to quit, and try to drink fewer caffeinated beverages. Alcohol and caffeine may urge you to smoke.  Avoid foods and drinks that can trigger a desire to smoke, such as sugary or spicy foods and alcohol.  Ask people who smoke not to smoke around you.  Have something planned to do right after eating or having a cup of coffee. For example, plan to take a walk or exercise.  Try a relaxation exercise to calm you down and decrease your stress. Remember, you may be tense and nervous for the first 2 weeks after you quit, but this will pass.  Find new activities to keep your hands busy. Play with a pen, coin, or rubber band. Doodle or draw things on paper.  Brush your teeth right after eating. This will help cut down on the craving for the taste of tobacco after meals. You can also try mouthwash.   Use oral substitutes in place of cigarettes. Try using lemon drops, carrots, cinnamon sticks, or chewing gum. Keep them handy so they are available when you have the urge to smoke.  When you have the urge to smoke, try deep breathing.  Designate your home as a nonsmoking area.  If you are a heavy smoker, ask your health care provider about a prescription for nicotine chewing gum. It can ease your withdrawal from nicotine.  Reward yourself. Set aside the cigarette money you save and buy yourself something nice.  Look for support from others. Join a support group or smoking cessation program. Ask someone at home or at work to help you with your plan to quit smoking.  Always ask yourself, "Do I need this cigarette or is this just a reflex?" Tell yourself, "Today, I choose not to smoke," or "I do not want to smoke." You are reminding yourself of your decision to quit.  Do not replace cigarette smoking with electronic cigarettes (commonly called e-cigarettes). The safety of e-cigarettes is unknown, and some may contain harmful  chemicals.  If you relapse, do not give up! Plan ahead and think about what  you will do the next time you get the urge to smoke. HOW WILL I FEEL WHEN I QUIT SMOKING? You may have symptoms of withdrawal because your body is used to nicotine (the addictive substance in cigarettes). You may crave cigarettes, be irritable, feel very hungry, cough often, get headaches, or have difficulty concentrating. The withdrawal symptoms are only temporary. They are strongest when you first quit but will go away within 10-14 days. When withdrawal symptoms occur, stay in control. Think about your reasons for quitting. Remind yourself that these are signs that your body is healing and getting used to being without cigarettes. Remember that withdrawal symptoms are easier to treat than the major diseases that smoking can cause.  Even after the withdrawal is over, expect periodic urges to smoke. However, these cravings are generally short lived and will go away whether you smoke or not. Do not smoke! WHAT RESOURCES ARE AVAILABLE TO HELP ME QUIT SMOKING? Your health care provider can direct you to community resources or hospitals for support, which may include:  Group support.  Education.  Hypnosis.  Therapy. Document Released: 10/19/2003 Document Revised: 06/06/2013 Document Reviewed: 07/08/2012 [facility] Patient Information 2015 [place], [place]. This information is not intended to replace advice given to you by your health care provider. Make sure you discuss any questions you have with your health care provider.

## 2014-08-30 DIAGNOSIS — N186 End stage renal disease: Secondary | ICD-10-CM | POA: Diagnosis not present

## 2014-08-30 DIAGNOSIS — Z4931 Encounter for adequacy testing for hemodialysis: Secondary | ICD-10-CM | POA: Diagnosis not present

## 2014-08-30 DIAGNOSIS — D509 Iron deficiency anemia, unspecified: Secondary | ICD-10-CM | POA: Diagnosis not present

## 2014-08-30 DIAGNOSIS — N2581 Secondary hyperparathyroidism of renal origin: Secondary | ICD-10-CM | POA: Diagnosis not present

## 2014-08-30 DIAGNOSIS — T8249XD Other complication of vascular dialysis catheter, subsequent encounter: Secondary | ICD-10-CM | POA: Diagnosis not present

## 2014-08-30 DIAGNOSIS — E119 Type 2 diabetes mellitus without complications: Secondary | ICD-10-CM | POA: Diagnosis not present

## 2014-09-01 DIAGNOSIS — N2581 Secondary hyperparathyroidism of renal origin: Secondary | ICD-10-CM | POA: Diagnosis not present

## 2014-09-01 DIAGNOSIS — N186 End stage renal disease: Secondary | ICD-10-CM | POA: Diagnosis not present

## 2014-09-01 DIAGNOSIS — Z4931 Encounter for adequacy testing for hemodialysis: Secondary | ICD-10-CM | POA: Diagnosis not present

## 2014-09-01 DIAGNOSIS — D509 Iron deficiency anemia, unspecified: Secondary | ICD-10-CM | POA: Diagnosis not present

## 2014-09-01 DIAGNOSIS — E119 Type 2 diabetes mellitus without complications: Secondary | ICD-10-CM | POA: Diagnosis not present

## 2014-09-01 DIAGNOSIS — T8249XD Other complication of vascular dialysis catheter, subsequent encounter: Secondary | ICD-10-CM | POA: Diagnosis not present

## 2014-09-03 DIAGNOSIS — I12 Hypertensive chronic kidney disease with 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: Secondary | ICD-10-CM | POA: Diagnosis not present

## 2014-09-03 DIAGNOSIS — Z992 Dependence on renal dialysis: Secondary | ICD-10-CM | POA: Diagnosis not present

## 2014-09-03 DIAGNOSIS — N186 End stage renal disease: Secondary | ICD-10-CM | POA: Diagnosis not present

## 2014-09-04 DIAGNOSIS — N186 End stage renal disease: Secondary | ICD-10-CM | POA: Diagnosis not present

## 2014-09-04 DIAGNOSIS — D631 Anemia in chronic kidney disease: Secondary | ICD-10-CM | POA: Diagnosis not present

## 2014-09-04 DIAGNOSIS — E119 Type 2 diabetes mellitus without complications: Secondary | ICD-10-CM | POA: Diagnosis not present

## 2014-09-04 DIAGNOSIS — D509 Iron deficiency anemia, unspecified: Secondary | ICD-10-CM | POA: Diagnosis not present

## 2014-09-04 DIAGNOSIS — Z4931 Encounter for adequacy testing for hemodialysis: Secondary | ICD-10-CM | POA: Diagnosis not present

## 2014-09-04 DIAGNOSIS — N2581 Secondary hyperparathyroidism of renal origin: Secondary | ICD-10-CM | POA: Diagnosis not present

## 2014-09-04 DIAGNOSIS — T8249XD Other complication of vascular dialysis catheter, subsequent encounter: Secondary | ICD-10-CM | POA: Diagnosis not present

## 2014-09-06 DIAGNOSIS — D631 Anemia in chronic kidney disease: Secondary | ICD-10-CM | POA: Diagnosis not present

## 2014-09-06 DIAGNOSIS — N2581 Secondary hyperparathyroidism of renal origin: Secondary | ICD-10-CM | POA: Diagnosis not present

## 2014-09-06 DIAGNOSIS — N186 End stage renal disease: Secondary | ICD-10-CM | POA: Diagnosis not present

## 2014-09-06 DIAGNOSIS — D509 Iron deficiency anemia, unspecified: Secondary | ICD-10-CM | POA: Diagnosis not present

## 2014-09-06 DIAGNOSIS — E119 Type 2 diabetes mellitus without complications: Secondary | ICD-10-CM | POA: Diagnosis not present

## 2014-09-06 DIAGNOSIS — T8249XD Other complication of vascular dialysis catheter, subsequent encounter: Secondary | ICD-10-CM | POA: Diagnosis not present

## 2014-09-08 DIAGNOSIS — D509 Iron deficiency anemia, unspecified: Secondary | ICD-10-CM | POA: Diagnosis not present

## 2014-09-08 DIAGNOSIS — D631 Anemia in chronic kidney disease: Secondary | ICD-10-CM | POA: Diagnosis not present

## 2014-09-08 DIAGNOSIS — N186 End stage renal disease: Secondary | ICD-10-CM | POA: Diagnosis not present

## 2014-09-08 DIAGNOSIS — E119 Type 2 diabetes mellitus without complications: Secondary | ICD-10-CM | POA: Diagnosis not present

## 2014-09-08 DIAGNOSIS — T8249XD Other complication of vascular dialysis catheter, subsequent encounter: Secondary | ICD-10-CM | POA: Diagnosis not present

## 2014-09-08 DIAGNOSIS — N2581 Secondary hyperparathyroidism of renal origin: Secondary | ICD-10-CM | POA: Diagnosis not present

## 2014-09-11 DIAGNOSIS — D631 Anemia in chronic kidney disease: Secondary | ICD-10-CM | POA: Diagnosis not present

## 2014-09-11 DIAGNOSIS — T8249XD Other complication of vascular dialysis catheter, subsequent encounter: Secondary | ICD-10-CM | POA: Diagnosis not present

## 2014-09-11 DIAGNOSIS — E119 Type 2 diabetes mellitus without complications: Secondary | ICD-10-CM | POA: Diagnosis not present

## 2014-09-11 DIAGNOSIS — N2581 Secondary hyperparathyroidism of renal origin: Secondary | ICD-10-CM | POA: Diagnosis not present

## 2014-09-11 DIAGNOSIS — N186 End stage renal disease: Secondary | ICD-10-CM | POA: Diagnosis not present

## 2014-09-11 DIAGNOSIS — D509 Iron deficiency anemia, unspecified: Secondary | ICD-10-CM | POA: Diagnosis not present

## 2014-09-13 DIAGNOSIS — D631 Anemia in chronic kidney disease: Secondary | ICD-10-CM | POA: Diagnosis not present

## 2014-09-13 DIAGNOSIS — E119 Type 2 diabetes mellitus without complications: Secondary | ICD-10-CM | POA: Diagnosis not present

## 2014-09-13 DIAGNOSIS — D509 Iron deficiency anemia, unspecified: Secondary | ICD-10-CM | POA: Diagnosis not present

## 2014-09-13 DIAGNOSIS — N186 End stage renal disease: Secondary | ICD-10-CM | POA: Diagnosis not present

## 2014-09-13 DIAGNOSIS — N2581 Secondary hyperparathyroidism of renal origin: Secondary | ICD-10-CM | POA: Diagnosis not present

## 2014-09-13 DIAGNOSIS — T8249XD Other complication of vascular dialysis catheter, subsequent encounter: Secondary | ICD-10-CM | POA: Diagnosis not present

## 2014-09-15 DIAGNOSIS — D631 Anemia in chronic kidney disease: Secondary | ICD-10-CM | POA: Diagnosis not present

## 2014-09-15 DIAGNOSIS — N2581 Secondary hyperparathyroidism of renal origin: Secondary | ICD-10-CM | POA: Diagnosis not present

## 2014-09-15 DIAGNOSIS — T8249XD Other complication of vascular dialysis catheter, subsequent encounter: Secondary | ICD-10-CM | POA: Diagnosis not present

## 2014-09-15 DIAGNOSIS — N186 End stage renal disease: Secondary | ICD-10-CM | POA: Diagnosis not present

## 2014-09-15 DIAGNOSIS — E119 Type 2 diabetes mellitus without complications: Secondary | ICD-10-CM | POA: Diagnosis not present

## 2014-09-15 DIAGNOSIS — D509 Iron deficiency anemia, unspecified: Secondary | ICD-10-CM | POA: Diagnosis not present

## 2014-09-18 DIAGNOSIS — E119 Type 2 diabetes mellitus without complications: Secondary | ICD-10-CM | POA: Diagnosis not present

## 2014-09-18 DIAGNOSIS — N2581 Secondary hyperparathyroidism of renal origin: Secondary | ICD-10-CM | POA: Diagnosis not present

## 2014-09-18 DIAGNOSIS — D631 Anemia in chronic kidney disease: Secondary | ICD-10-CM | POA: Diagnosis not present

## 2014-09-18 DIAGNOSIS — T8249XD Other complication of vascular dialysis catheter, subsequent encounter: Secondary | ICD-10-CM | POA: Diagnosis not present

## 2014-09-18 DIAGNOSIS — D509 Iron deficiency anemia, unspecified: Secondary | ICD-10-CM | POA: Diagnosis not present

## 2014-09-18 DIAGNOSIS — N186 End stage renal disease: Secondary | ICD-10-CM | POA: Diagnosis not present

## 2014-09-20 DIAGNOSIS — N186 End stage renal disease: Secondary | ICD-10-CM | POA: Diagnosis not present

## 2014-09-20 DIAGNOSIS — D509 Iron deficiency anemia, unspecified: Secondary | ICD-10-CM | POA: Diagnosis not present

## 2014-09-20 DIAGNOSIS — D631 Anemia in chronic kidney disease: Secondary | ICD-10-CM | POA: Diagnosis not present

## 2014-09-20 DIAGNOSIS — T8249XD Other complication of vascular dialysis catheter, subsequent encounter: Secondary | ICD-10-CM | POA: Diagnosis not present

## 2014-09-20 DIAGNOSIS — N2581 Secondary hyperparathyroidism of renal origin: Secondary | ICD-10-CM | POA: Diagnosis not present

## 2014-09-20 DIAGNOSIS — E119 Type 2 diabetes mellitus without complications: Secondary | ICD-10-CM | POA: Diagnosis not present

## 2014-09-22 DIAGNOSIS — D509 Iron deficiency anemia, unspecified: Secondary | ICD-10-CM | POA: Diagnosis not present

## 2014-09-22 DIAGNOSIS — T8249XD Other complication of vascular dialysis catheter, subsequent encounter: Secondary | ICD-10-CM | POA: Diagnosis not present

## 2014-09-22 DIAGNOSIS — D631 Anemia in chronic kidney disease: Secondary | ICD-10-CM | POA: Diagnosis not present

## 2014-09-22 DIAGNOSIS — N2581 Secondary hyperparathyroidism of renal origin: Secondary | ICD-10-CM | POA: Diagnosis not present

## 2014-09-22 DIAGNOSIS — N186 End stage renal disease: Secondary | ICD-10-CM | POA: Diagnosis not present

## 2014-09-22 DIAGNOSIS — E119 Type 2 diabetes mellitus without complications: Secondary | ICD-10-CM | POA: Diagnosis not present

## 2014-09-25 DIAGNOSIS — E119 Type 2 diabetes mellitus without complications: Secondary | ICD-10-CM | POA: Diagnosis not present

## 2014-09-25 DIAGNOSIS — N186 End stage renal disease: Secondary | ICD-10-CM | POA: Diagnosis not present

## 2014-09-25 DIAGNOSIS — T8249XD Other complication of vascular dialysis catheter, subsequent encounter: Secondary | ICD-10-CM | POA: Diagnosis not present

## 2014-09-25 DIAGNOSIS — N2581 Secondary hyperparathyroidism of renal origin: Secondary | ICD-10-CM | POA: Diagnosis not present

## 2014-09-25 DIAGNOSIS — D631 Anemia in chronic kidney disease: Secondary | ICD-10-CM | POA: Diagnosis not present

## 2014-09-25 DIAGNOSIS — D509 Iron deficiency anemia, unspecified: Secondary | ICD-10-CM | POA: Diagnosis not present

## 2014-09-26 ENCOUNTER — Encounter: Payer: Medicare Other | Admitting: Vascular Surgery

## 2014-09-27 DIAGNOSIS — D631 Anemia in chronic kidney disease: Secondary | ICD-10-CM | POA: Diagnosis not present

## 2014-09-27 DIAGNOSIS — E119 Type 2 diabetes mellitus without complications: Secondary | ICD-10-CM | POA: Diagnosis not present

## 2014-09-27 DIAGNOSIS — N2581 Secondary hyperparathyroidism of renal origin: Secondary | ICD-10-CM | POA: Diagnosis not present

## 2014-09-27 DIAGNOSIS — D509 Iron deficiency anemia, unspecified: Secondary | ICD-10-CM | POA: Diagnosis not present

## 2014-09-27 DIAGNOSIS — T8249XD Other complication of vascular dialysis catheter, subsequent encounter: Secondary | ICD-10-CM | POA: Diagnosis not present

## 2014-09-27 DIAGNOSIS — N186 End stage renal disease: Secondary | ICD-10-CM | POA: Diagnosis not present

## 2014-09-29 DIAGNOSIS — D631 Anemia in chronic kidney disease: Secondary | ICD-10-CM | POA: Diagnosis not present

## 2014-09-29 DIAGNOSIS — E119 Type 2 diabetes mellitus without complications: Secondary | ICD-10-CM | POA: Diagnosis not present

## 2014-09-29 DIAGNOSIS — N2581 Secondary hyperparathyroidism of renal origin: Secondary | ICD-10-CM | POA: Diagnosis not present

## 2014-09-29 DIAGNOSIS — N186 End stage renal disease: Secondary | ICD-10-CM | POA: Diagnosis not present

## 2014-09-29 DIAGNOSIS — D509 Iron deficiency anemia, unspecified: Secondary | ICD-10-CM | POA: Diagnosis not present

## 2014-09-29 DIAGNOSIS — T8249XD Other complication of vascular dialysis catheter, subsequent encounter: Secondary | ICD-10-CM | POA: Diagnosis not present

## 2014-10-02 ENCOUNTER — Encounter: Payer: Self-pay | Admitting: Vascular Surgery

## 2014-10-02 DIAGNOSIS — D631 Anemia in chronic kidney disease: Secondary | ICD-10-CM | POA: Diagnosis not present

## 2014-10-02 DIAGNOSIS — N186 End stage renal disease: Secondary | ICD-10-CM | POA: Diagnosis not present

## 2014-10-02 DIAGNOSIS — D509 Iron deficiency anemia, unspecified: Secondary | ICD-10-CM | POA: Diagnosis not present

## 2014-10-02 DIAGNOSIS — T8249XD Other complication of vascular dialysis catheter, subsequent encounter: Secondary | ICD-10-CM | POA: Diagnosis not present

## 2014-10-02 DIAGNOSIS — E119 Type 2 diabetes mellitus without complications: Secondary | ICD-10-CM | POA: Diagnosis not present

## 2014-10-02 DIAGNOSIS — N2581 Secondary hyperparathyroidism of renal origin: Secondary | ICD-10-CM | POA: Diagnosis not present

## 2014-10-03 ENCOUNTER — Ambulatory Visit (INDEPENDENT_AMBULATORY_CARE_PROVIDER_SITE_OTHER): Payer: Self-pay | Admitting: Vascular Surgery

## 2014-10-03 ENCOUNTER — Encounter: Payer: Self-pay | Admitting: Vascular Surgery

## 2014-10-03 VITALS — BP 134/84 | HR 70 | Temp 98.2°F | Ht 66.0 in | Wt 172.0 lb

## 2014-10-03 DIAGNOSIS — N186 End stage renal disease: Secondary | ICD-10-CM

## 2014-10-03 NOTE — Progress Notes (Signed)
Subjective:     Patient ID: Savior Himebaugh, male   DOB: 08-Jan-1966, 49 y.o.   MRN: [id number]  HPI this 49 year old male with end-stage renal disease had basilic vein transposition created by me on 08/10/2014. He denies any pain or numbness in the left hand. He is currently being dialyzed through a tunneled catheter. He states that the edema in the upper arm and forearm has gradually diminished since surgery and is almost back to baseline.   Review of Systems     Objective:   Physical Exam BP 134/84 mmHg  Pulse 70  Temp(Src) 98.2 F (36.8 C) (Oral)  Ht 5' 6"  (1.676 m)  Wt 172 lb (78.019 kg)  BMI 27.77 kg/m2  SpO2 100%  Gen. well-developed well-nourished male in no apparent distress alert and oriented 3 Left upper extremity with well-healed surgical incisions. Excellent pulse and palpable thrill and basilic vein transposition which is easily palpable and subcutaneous position. 2+ radial pulse palpable which improves with compression of the fistula. Left hand well perfused.     Assessment:     Status post left basilic vein transposition on 08/10/2014-functioning nicely    Plan:     Will be okay to access left basilic vein transposition in mid October 2016 Return to see me on when necessary basis

## 2014-10-04 DIAGNOSIS — I12 Hypertensive chronic kidney disease with 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: Secondary | ICD-10-CM | POA: Diagnosis not present

## 2014-10-04 DIAGNOSIS — N2581 Secondary hyperparathyroidism of renal origin: Secondary | ICD-10-CM | POA: Diagnosis not present

## 2014-10-04 DIAGNOSIS — D631 Anemia in chronic kidney disease: Secondary | ICD-10-CM | POA: Diagnosis not present

## 2014-10-04 DIAGNOSIS — Z992 Dependence on renal dialysis: Secondary | ICD-10-CM | POA: Diagnosis not present

## 2014-10-04 DIAGNOSIS — T8249XD Other complication of vascular dialysis catheter, subsequent encounter: Secondary | ICD-10-CM | POA: Diagnosis not present

## 2014-10-04 DIAGNOSIS — E119 Type 2 diabetes mellitus without complications: Secondary | ICD-10-CM | POA: Diagnosis not present

## 2014-10-04 DIAGNOSIS — N186 End stage renal disease: Secondary | ICD-10-CM | POA: Diagnosis not present

## 2014-10-04 DIAGNOSIS — D509 Iron deficiency anemia, unspecified: Secondary | ICD-10-CM | POA: Diagnosis not present

## 2014-10-06 DIAGNOSIS — N2581 Secondary hyperparathyroidism of renal origin: Secondary | ICD-10-CM | POA: Diagnosis not present

## 2014-10-06 DIAGNOSIS — Z4931 Encounter for adequacy testing for hemodialysis: Secondary | ICD-10-CM | POA: Diagnosis not present

## 2014-10-06 DIAGNOSIS — D509 Iron deficiency anemia, unspecified: Secondary | ICD-10-CM | POA: Diagnosis not present

## 2014-10-06 DIAGNOSIS — D631 Anemia in chronic kidney disease: Secondary | ICD-10-CM | POA: Diagnosis not present

## 2014-10-06 DIAGNOSIS — N186 End stage renal disease: Secondary | ICD-10-CM | POA: Diagnosis not present

## 2014-10-06 DIAGNOSIS — E119 Type 2 diabetes mellitus without complications: Secondary | ICD-10-CM | POA: Diagnosis not present

## 2014-10-09 DIAGNOSIS — N186 End stage renal disease: Secondary | ICD-10-CM | POA: Diagnosis not present

## 2014-10-09 DIAGNOSIS — N2581 Secondary hyperparathyroidism of renal origin: Secondary | ICD-10-CM | POA: Diagnosis not present

## 2014-10-09 DIAGNOSIS — D631 Anemia in chronic kidney disease: Secondary | ICD-10-CM | POA: Diagnosis not present

## 2014-10-09 DIAGNOSIS — Z4931 Encounter for adequacy testing for hemodialysis: Secondary | ICD-10-CM | POA: Diagnosis not present

## 2014-10-09 DIAGNOSIS — E119 Type 2 diabetes mellitus without complications: Secondary | ICD-10-CM | POA: Diagnosis not present

## 2014-10-09 DIAGNOSIS — D509 Iron deficiency anemia, unspecified: Secondary | ICD-10-CM | POA: Diagnosis not present

## 2014-10-11 DIAGNOSIS — Z4931 Encounter for adequacy testing for hemodialysis: Secondary | ICD-10-CM | POA: Diagnosis not present

## 2014-10-11 DIAGNOSIS — D631 Anemia in chronic kidney disease: Secondary | ICD-10-CM | POA: Diagnosis not present

## 2014-10-11 DIAGNOSIS — D509 Iron deficiency anemia, unspecified: Secondary | ICD-10-CM | POA: Diagnosis not present

## 2014-10-11 DIAGNOSIS — N186 End stage renal disease: Secondary | ICD-10-CM | POA: Diagnosis not present

## 2014-10-11 DIAGNOSIS — E119 Type 2 diabetes mellitus without complications: Secondary | ICD-10-CM | POA: Diagnosis not present

## 2014-10-11 DIAGNOSIS — N2581 Secondary hyperparathyroidism of renal origin: Secondary | ICD-10-CM | POA: Diagnosis not present

## 2014-10-13 DIAGNOSIS — E119 Type 2 diabetes mellitus without complications: Secondary | ICD-10-CM | POA: Diagnosis not present

## 2014-10-13 DIAGNOSIS — N186 End stage renal disease: Secondary | ICD-10-CM | POA: Diagnosis not present

## 2014-10-13 DIAGNOSIS — N2581 Secondary hyperparathyroidism of renal origin: Secondary | ICD-10-CM | POA: Diagnosis not present

## 2014-10-13 DIAGNOSIS — D509 Iron deficiency anemia, unspecified: Secondary | ICD-10-CM | POA: Diagnosis not present

## 2014-10-13 DIAGNOSIS — Z4931 Encounter for adequacy testing for hemodialysis: Secondary | ICD-10-CM | POA: Diagnosis not present

## 2014-10-13 DIAGNOSIS — D631 Anemia in chronic kidney disease: Secondary | ICD-10-CM | POA: Diagnosis not present

## 2014-10-16 DIAGNOSIS — N186 End stage renal disease: Secondary | ICD-10-CM | POA: Diagnosis not present

## 2014-10-16 DIAGNOSIS — N2581 Secondary hyperparathyroidism of renal origin: Secondary | ICD-10-CM | POA: Diagnosis not present

## 2014-10-16 DIAGNOSIS — D631 Anemia in chronic kidney disease: Secondary | ICD-10-CM | POA: Diagnosis not present

## 2014-10-16 DIAGNOSIS — E119 Type 2 diabetes mellitus without complications: Secondary | ICD-10-CM | POA: Diagnosis not present

## 2014-10-16 DIAGNOSIS — Z4931 Encounter for adequacy testing for hemodialysis: Secondary | ICD-10-CM | POA: Diagnosis not present

## 2014-10-16 DIAGNOSIS — D509 Iron deficiency anemia, unspecified: Secondary | ICD-10-CM | POA: Diagnosis not present

## 2014-10-18 DIAGNOSIS — D631 Anemia in chronic kidney disease: Secondary | ICD-10-CM | POA: Diagnosis not present

## 2014-10-18 DIAGNOSIS — D509 Iron deficiency anemia, unspecified: Secondary | ICD-10-CM | POA: Diagnosis not present

## 2014-10-18 DIAGNOSIS — N186 End stage renal disease: Secondary | ICD-10-CM | POA: Diagnosis not present

## 2014-10-18 DIAGNOSIS — E119 Type 2 diabetes mellitus without complications: Secondary | ICD-10-CM | POA: Diagnosis not present

## 2014-10-18 DIAGNOSIS — N2581 Secondary hyperparathyroidism of renal origin: Secondary | ICD-10-CM | POA: Diagnosis not present

## 2014-10-18 DIAGNOSIS — Z4931 Encounter for adequacy testing for hemodialysis: Secondary | ICD-10-CM | POA: Diagnosis not present

## 2014-10-20 DIAGNOSIS — Z4931 Encounter for adequacy testing for hemodialysis: Secondary | ICD-10-CM | POA: Diagnosis not present

## 2014-10-20 DIAGNOSIS — D509 Iron deficiency anemia, unspecified: Secondary | ICD-10-CM | POA: Diagnosis not present

## 2014-10-20 DIAGNOSIS — D631 Anemia in chronic kidney disease: Secondary | ICD-10-CM | POA: Diagnosis not present

## 2014-10-20 DIAGNOSIS — N186 End stage renal disease: Secondary | ICD-10-CM | POA: Diagnosis not present

## 2014-10-20 DIAGNOSIS — E119 Type 2 diabetes mellitus without complications: Secondary | ICD-10-CM | POA: Diagnosis not present

## 2014-10-20 DIAGNOSIS — N2581 Secondary hyperparathyroidism of renal origin: Secondary | ICD-10-CM | POA: Diagnosis not present

## 2014-10-23 DIAGNOSIS — N2581 Secondary hyperparathyroidism of renal origin: Secondary | ICD-10-CM | POA: Diagnosis not present

## 2014-10-23 DIAGNOSIS — Z4931 Encounter for adequacy testing for hemodialysis: Secondary | ICD-10-CM | POA: Diagnosis not present

## 2014-10-23 DIAGNOSIS — D509 Iron deficiency anemia, unspecified: Secondary | ICD-10-CM | POA: Diagnosis not present

## 2014-10-23 DIAGNOSIS — N186 End stage renal disease: Secondary | ICD-10-CM | POA: Diagnosis not present

## 2014-10-23 DIAGNOSIS — E119 Type 2 diabetes mellitus without complications: Secondary | ICD-10-CM | POA: Diagnosis not present

## 2014-10-23 DIAGNOSIS — D631 Anemia in chronic kidney disease: Secondary | ICD-10-CM | POA: Diagnosis not present

## 2014-10-25 DIAGNOSIS — E119 Type 2 diabetes mellitus without complications: Secondary | ICD-10-CM | POA: Diagnosis not present

## 2014-10-25 DIAGNOSIS — N2581 Secondary hyperparathyroidism of renal origin: Secondary | ICD-10-CM | POA: Diagnosis not present

## 2014-10-25 DIAGNOSIS — D631 Anemia in chronic kidney disease: Secondary | ICD-10-CM | POA: Diagnosis not present

## 2014-10-25 DIAGNOSIS — D509 Iron deficiency anemia, unspecified: Secondary | ICD-10-CM | POA: Diagnosis not present

## 2014-10-25 DIAGNOSIS — N186 End stage renal disease: Secondary | ICD-10-CM | POA: Diagnosis not present

## 2014-10-25 DIAGNOSIS — Z4931 Encounter for adequacy testing for hemodialysis: Secondary | ICD-10-CM | POA: Diagnosis not present

## 2014-10-27 DIAGNOSIS — Z4931 Encounter for adequacy testing for hemodialysis: Secondary | ICD-10-CM | POA: Diagnosis not present

## 2014-10-27 DIAGNOSIS — D631 Anemia in chronic kidney disease: Secondary | ICD-10-CM | POA: Diagnosis not present

## 2014-10-27 DIAGNOSIS — D509 Iron deficiency anemia, unspecified: Secondary | ICD-10-CM | POA: Diagnosis not present

## 2014-10-27 DIAGNOSIS — N186 End stage renal disease: Secondary | ICD-10-CM | POA: Diagnosis not present

## 2014-10-27 DIAGNOSIS — E119 Type 2 diabetes mellitus without complications: Secondary | ICD-10-CM | POA: Diagnosis not present

## 2014-10-27 DIAGNOSIS — N2581 Secondary hyperparathyroidism of renal origin: Secondary | ICD-10-CM | POA: Diagnosis not present

## 2014-10-30 DIAGNOSIS — Z4931 Encounter for adequacy testing for hemodialysis: Secondary | ICD-10-CM | POA: Diagnosis not present

## 2014-10-30 DIAGNOSIS — D509 Iron deficiency anemia, unspecified: Secondary | ICD-10-CM | POA: Diagnosis not present

## 2014-10-30 DIAGNOSIS — N186 End stage renal disease: Secondary | ICD-10-CM | POA: Diagnosis not present

## 2014-10-30 DIAGNOSIS — D631 Anemia in chronic kidney disease: Secondary | ICD-10-CM | POA: Diagnosis not present

## 2014-10-30 DIAGNOSIS — N2581 Secondary hyperparathyroidism of renal origin: Secondary | ICD-10-CM | POA: Diagnosis not present

## 2014-10-30 DIAGNOSIS — E119 Type 2 diabetes mellitus without complications: Secondary | ICD-10-CM | POA: Diagnosis not present

## 2014-11-01 DIAGNOSIS — N186 End stage renal disease: Secondary | ICD-10-CM | POA: Diagnosis not present

## 2014-11-01 DIAGNOSIS — D631 Anemia in chronic kidney disease: Secondary | ICD-10-CM | POA: Diagnosis not present

## 2014-11-01 DIAGNOSIS — E119 Type 2 diabetes mellitus without complications: Secondary | ICD-10-CM | POA: Diagnosis not present

## 2014-11-01 DIAGNOSIS — D509 Iron deficiency anemia, unspecified: Secondary | ICD-10-CM | POA: Diagnosis not present

## 2014-11-01 DIAGNOSIS — Z4931 Encounter for adequacy testing for hemodialysis: Secondary | ICD-10-CM | POA: Diagnosis not present

## 2014-11-01 DIAGNOSIS — N2581 Secondary hyperparathyroidism of renal origin: Secondary | ICD-10-CM | POA: Diagnosis not present

## 2014-11-03 DIAGNOSIS — D509 Iron deficiency anemia, unspecified: Secondary | ICD-10-CM | POA: Diagnosis not present

## 2014-11-03 DIAGNOSIS — N2581 Secondary hyperparathyroidism of renal origin: Secondary | ICD-10-CM | POA: Diagnosis not present

## 2014-11-03 DIAGNOSIS — Z992 Dependence on renal dialysis: Secondary | ICD-10-CM | POA: Diagnosis not present

## 2014-11-03 DIAGNOSIS — I12 Hypertensive chronic kidney disease with 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: Secondary | ICD-10-CM | POA: Diagnosis not present

## 2014-11-03 DIAGNOSIS — E119 Type 2 diabetes mellitus without complications: Secondary | ICD-10-CM | POA: Diagnosis not present

## 2014-11-03 DIAGNOSIS — D631 Anemia in chronic kidney disease: Secondary | ICD-10-CM | POA: Diagnosis not present

## 2014-11-03 DIAGNOSIS — N186 End stage renal disease: Secondary | ICD-10-CM | POA: Diagnosis not present

## 2014-11-03 DIAGNOSIS — Z4931 Encounter for adequacy testing for hemodialysis: Secondary | ICD-10-CM | POA: Diagnosis not present

## 2014-11-06 DIAGNOSIS — D631 Anemia in chronic kidney disease: Secondary | ICD-10-CM | POA: Diagnosis not present

## 2014-11-06 DIAGNOSIS — D509 Iron deficiency anemia, unspecified: Secondary | ICD-10-CM | POA: Diagnosis not present

## 2014-11-06 DIAGNOSIS — N2581 Secondary hyperparathyroidism of renal origin: Secondary | ICD-10-CM | POA: Diagnosis not present

## 2014-11-06 DIAGNOSIS — N186 End stage renal disease: Secondary | ICD-10-CM | POA: Diagnosis not present

## 2014-11-06 DIAGNOSIS — Z4931 Encounter for adequacy testing for hemodialysis: Secondary | ICD-10-CM | POA: Diagnosis not present

## 2014-11-08 DIAGNOSIS — D509 Iron deficiency anemia, unspecified: Secondary | ICD-10-CM | POA: Diagnosis not present

## 2014-11-08 DIAGNOSIS — D631 Anemia in chronic kidney disease: Secondary | ICD-10-CM | POA: Diagnosis not present

## 2014-11-08 DIAGNOSIS — Z4931 Encounter for adequacy testing for hemodialysis: Secondary | ICD-10-CM | POA: Diagnosis not present

## 2014-11-08 DIAGNOSIS — N2581 Secondary hyperparathyroidism of renal origin: Secondary | ICD-10-CM | POA: Diagnosis not present

## 2014-11-08 DIAGNOSIS — N186 End stage renal disease: Secondary | ICD-10-CM | POA: Diagnosis not present

## 2014-11-10 DIAGNOSIS — N2581 Secondary hyperparathyroidism of renal origin: Secondary | ICD-10-CM | POA: Diagnosis not present

## 2014-11-10 DIAGNOSIS — D509 Iron deficiency anemia, unspecified: Secondary | ICD-10-CM | POA: Diagnosis not present

## 2014-11-10 DIAGNOSIS — D631 Anemia in chronic kidney disease: Secondary | ICD-10-CM | POA: Diagnosis not present

## 2014-11-10 DIAGNOSIS — Z4931 Encounter for adequacy testing for hemodialysis: Secondary | ICD-10-CM | POA: Diagnosis not present

## 2014-11-10 DIAGNOSIS — N186 End stage renal disease: Secondary | ICD-10-CM | POA: Diagnosis not present

## 2014-11-13 DIAGNOSIS — D631 Anemia in chronic kidney disease: Secondary | ICD-10-CM | POA: Diagnosis not present

## 2014-11-13 DIAGNOSIS — Z4931 Encounter for adequacy testing for hemodialysis: Secondary | ICD-10-CM | POA: Diagnosis not present

## 2014-11-13 DIAGNOSIS — N186 End stage renal disease: Secondary | ICD-10-CM | POA: Diagnosis not present

## 2014-11-13 DIAGNOSIS — N2581 Secondary hyperparathyroidism of renal origin: Secondary | ICD-10-CM | POA: Diagnosis not present

## 2014-11-13 DIAGNOSIS — D509 Iron deficiency anemia, unspecified: Secondary | ICD-10-CM | POA: Diagnosis not present

## 2014-11-15 DIAGNOSIS — D631 Anemia in chronic kidney disease: Secondary | ICD-10-CM | POA: Diagnosis not present

## 2014-11-15 DIAGNOSIS — N186 End stage renal disease: Secondary | ICD-10-CM | POA: Diagnosis not present

## 2014-11-15 DIAGNOSIS — D509 Iron deficiency anemia, unspecified: Secondary | ICD-10-CM | POA: Diagnosis not present

## 2014-11-15 DIAGNOSIS — Z4931 Encounter for adequacy testing for hemodialysis: Secondary | ICD-10-CM | POA: Diagnosis not present

## 2014-11-15 DIAGNOSIS — N2581 Secondary hyperparathyroidism of renal origin: Secondary | ICD-10-CM | POA: Diagnosis not present

## 2014-11-17 DIAGNOSIS — N2581 Secondary hyperparathyroidism of renal origin: Secondary | ICD-10-CM | POA: Diagnosis not present

## 2014-11-17 DIAGNOSIS — Z4931 Encounter for adequacy testing for hemodialysis: Secondary | ICD-10-CM | POA: Diagnosis not present

## 2014-11-17 DIAGNOSIS — D509 Iron deficiency anemia, unspecified: Secondary | ICD-10-CM | POA: Diagnosis not present

## 2014-11-17 DIAGNOSIS — D631 Anemia in chronic kidney disease: Secondary | ICD-10-CM | POA: Diagnosis not present

## 2014-11-17 DIAGNOSIS — N186 End stage renal disease: Secondary | ICD-10-CM | POA: Diagnosis not present

## 2014-11-20 DIAGNOSIS — D631 Anemia in chronic kidney disease: Secondary | ICD-10-CM | POA: Diagnosis not present

## 2014-11-20 DIAGNOSIS — Z4931 Encounter for adequacy testing for hemodialysis: Secondary | ICD-10-CM | POA: Diagnosis not present

## 2014-11-20 DIAGNOSIS — N2581 Secondary hyperparathyroidism of renal origin: Secondary | ICD-10-CM | POA: Diagnosis not present

## 2014-11-20 DIAGNOSIS — D509 Iron deficiency anemia, unspecified: Secondary | ICD-10-CM | POA: Diagnosis not present

## 2014-11-20 DIAGNOSIS — N186 End stage renal disease: Secondary | ICD-10-CM | POA: Diagnosis not present

## 2014-11-22 DIAGNOSIS — D509 Iron deficiency anemia, unspecified: Secondary | ICD-10-CM | POA: Diagnosis not present

## 2014-11-22 DIAGNOSIS — N186 End stage renal disease: Secondary | ICD-10-CM | POA: Diagnosis not present

## 2014-11-22 DIAGNOSIS — Z4931 Encounter for adequacy testing for hemodialysis: Secondary | ICD-10-CM | POA: Diagnosis not present

## 2014-11-22 DIAGNOSIS — D631 Anemia in chronic kidney disease: Secondary | ICD-10-CM | POA: Diagnosis not present

## 2014-11-22 DIAGNOSIS — N2581 Secondary hyperparathyroidism of renal origin: Secondary | ICD-10-CM | POA: Diagnosis not present

## 2014-11-24 DIAGNOSIS — D509 Iron deficiency anemia, unspecified: Secondary | ICD-10-CM | POA: Diagnosis not present

## 2014-11-24 DIAGNOSIS — N186 End stage renal disease: Secondary | ICD-10-CM | POA: Diagnosis not present

## 2014-11-24 DIAGNOSIS — N2581 Secondary hyperparathyroidism of renal origin: Secondary | ICD-10-CM | POA: Diagnosis not present

## 2014-11-24 DIAGNOSIS — Z4931 Encounter for adequacy testing for hemodialysis: Secondary | ICD-10-CM | POA: Diagnosis not present

## 2014-11-24 DIAGNOSIS — D631 Anemia in chronic kidney disease: Secondary | ICD-10-CM | POA: Diagnosis not present

## 2014-11-27 DIAGNOSIS — N186 End stage renal disease: Secondary | ICD-10-CM | POA: Diagnosis not present

## 2014-11-27 DIAGNOSIS — N2581 Secondary hyperparathyroidism of renal origin: Secondary | ICD-10-CM | POA: Diagnosis not present

## 2014-11-27 DIAGNOSIS — D509 Iron deficiency anemia, unspecified: Secondary | ICD-10-CM | POA: Diagnosis not present

## 2014-11-27 DIAGNOSIS — D631 Anemia in chronic kidney disease: Secondary | ICD-10-CM | POA: Diagnosis not present

## 2014-11-27 DIAGNOSIS — Z4931 Encounter for adequacy testing for hemodialysis: Secondary | ICD-10-CM | POA: Diagnosis not present

## 2014-11-28 DIAGNOSIS — Z992 Dependence on renal dialysis: Secondary | ICD-10-CM | POA: Insufficient documentation

## 2014-11-29 DIAGNOSIS — N2581 Secondary hyperparathyroidism of renal origin: Secondary | ICD-10-CM | POA: Diagnosis not present

## 2014-11-29 DIAGNOSIS — N186 End stage renal disease: Secondary | ICD-10-CM | POA: Diagnosis not present

## 2014-11-29 DIAGNOSIS — D509 Iron deficiency anemia, unspecified: Secondary | ICD-10-CM | POA: Diagnosis not present

## 2014-11-29 DIAGNOSIS — Z4931 Encounter for adequacy testing for hemodialysis: Secondary | ICD-10-CM | POA: Diagnosis not present

## 2014-11-29 DIAGNOSIS — D631 Anemia in chronic kidney disease: Secondary | ICD-10-CM | POA: Diagnosis not present

## 2014-12-01 DIAGNOSIS — N186 End stage renal disease: Secondary | ICD-10-CM | POA: Diagnosis not present

## 2014-12-01 DIAGNOSIS — D631 Anemia in chronic kidney disease: Secondary | ICD-10-CM | POA: Diagnosis not present

## 2014-12-01 DIAGNOSIS — D509 Iron deficiency anemia, unspecified: Secondary | ICD-10-CM | POA: Diagnosis not present

## 2014-12-01 DIAGNOSIS — N2581 Secondary hyperparathyroidism of renal origin: Secondary | ICD-10-CM | POA: Diagnosis not present

## 2014-12-01 DIAGNOSIS — Z4931 Encounter for adequacy testing for hemodialysis: Secondary | ICD-10-CM | POA: Diagnosis not present

## 2014-12-04 DIAGNOSIS — Z4931 Encounter for adequacy testing for hemodialysis: Secondary | ICD-10-CM | POA: Diagnosis not present

## 2014-12-04 DIAGNOSIS — N2581 Secondary hyperparathyroidism of renal origin: Secondary | ICD-10-CM | POA: Diagnosis not present

## 2014-12-04 DIAGNOSIS — D631 Anemia in chronic kidney disease: Secondary | ICD-10-CM | POA: Diagnosis not present

## 2014-12-04 DIAGNOSIS — I12 Hypertensive chronic kidney disease with 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: Secondary | ICD-10-CM | POA: Diagnosis not present

## 2014-12-04 DIAGNOSIS — D509 Iron deficiency anemia, unspecified: Secondary | ICD-10-CM | POA: Diagnosis not present

## 2014-12-04 DIAGNOSIS — N186 End stage renal disease: Secondary | ICD-10-CM | POA: Diagnosis not present

## 2014-12-04 DIAGNOSIS — Z992 Dependence on renal dialysis: Secondary | ICD-10-CM | POA: Diagnosis not present

## 2014-12-06 DIAGNOSIS — E119 Type 2 diabetes mellitus without complications: Secondary | ICD-10-CM | POA: Diagnosis not present

## 2014-12-06 DIAGNOSIS — Z992 Dependence on renal dialysis: Secondary | ICD-10-CM | POA: Diagnosis not present

## 2014-12-06 DIAGNOSIS — N186 End stage renal disease: Secondary | ICD-10-CM | POA: Diagnosis not present

## 2014-12-06 DIAGNOSIS — D509 Iron deficiency anemia, unspecified: Secondary | ICD-10-CM | POA: Diagnosis not present

## 2014-12-06 DIAGNOSIS — D631 Anemia in chronic kidney disease: Secondary | ICD-10-CM | POA: Diagnosis not present

## 2014-12-06 DIAGNOSIS — N2581 Secondary hyperparathyroidism of renal origin: Secondary | ICD-10-CM | POA: Diagnosis not present

## 2014-12-08 DIAGNOSIS — E119 Type 2 diabetes mellitus without complications: Secondary | ICD-10-CM | POA: Diagnosis not present

## 2014-12-08 DIAGNOSIS — D509 Iron deficiency anemia, unspecified: Secondary | ICD-10-CM | POA: Diagnosis not present

## 2014-12-08 DIAGNOSIS — Z992 Dependence on renal dialysis: Secondary | ICD-10-CM | POA: Diagnosis not present

## 2014-12-08 DIAGNOSIS — N2581 Secondary hyperparathyroidism of renal origin: Secondary | ICD-10-CM | POA: Diagnosis not present

## 2014-12-08 DIAGNOSIS — N186 End stage renal disease: Secondary | ICD-10-CM | POA: Diagnosis not present

## 2014-12-08 DIAGNOSIS — D631 Anemia in chronic kidney disease: Secondary | ICD-10-CM | POA: Diagnosis not present

## 2014-12-11 DIAGNOSIS — N186 End stage renal disease: Secondary | ICD-10-CM | POA: Diagnosis not present

## 2014-12-11 DIAGNOSIS — E119 Type 2 diabetes mellitus without complications: Secondary | ICD-10-CM | POA: Diagnosis not present

## 2014-12-11 DIAGNOSIS — Z992 Dependence on renal dialysis: Secondary | ICD-10-CM | POA: Diagnosis not present

## 2014-12-11 DIAGNOSIS — D509 Iron deficiency anemia, unspecified: Secondary | ICD-10-CM | POA: Diagnosis not present

## 2014-12-11 DIAGNOSIS — N2581 Secondary hyperparathyroidism of renal origin: Secondary | ICD-10-CM | POA: Diagnosis not present

## 2014-12-11 DIAGNOSIS — D631 Anemia in chronic kidney disease: Secondary | ICD-10-CM | POA: Diagnosis not present

## 2014-12-13 DIAGNOSIS — Z992 Dependence on renal dialysis: Secondary | ICD-10-CM | POA: Diagnosis not present

## 2014-12-13 DIAGNOSIS — D631 Anemia in chronic kidney disease: Secondary | ICD-10-CM | POA: Diagnosis not present

## 2014-12-13 DIAGNOSIS — N2581 Secondary hyperparathyroidism of renal origin: Secondary | ICD-10-CM | POA: Diagnosis not present

## 2014-12-13 DIAGNOSIS — E119 Type 2 diabetes mellitus without complications: Secondary | ICD-10-CM | POA: Diagnosis not present

## 2014-12-13 DIAGNOSIS — D509 Iron deficiency anemia, unspecified: Secondary | ICD-10-CM | POA: Diagnosis not present

## 2014-12-13 DIAGNOSIS — N186 End stage renal disease: Secondary | ICD-10-CM | POA: Diagnosis not present

## 2014-12-15 DIAGNOSIS — N2581 Secondary hyperparathyroidism of renal origin: Secondary | ICD-10-CM | POA: Diagnosis not present

## 2014-12-15 DIAGNOSIS — D509 Iron deficiency anemia, unspecified: Secondary | ICD-10-CM | POA: Diagnosis not present

## 2014-12-15 DIAGNOSIS — D631 Anemia in chronic kidney disease: Secondary | ICD-10-CM | POA: Diagnosis not present

## 2014-12-15 DIAGNOSIS — N186 End stage renal disease: Secondary | ICD-10-CM | POA: Diagnosis not present

## 2014-12-15 DIAGNOSIS — Z992 Dependence on renal dialysis: Secondary | ICD-10-CM | POA: Diagnosis not present

## 2014-12-15 DIAGNOSIS — E119 Type 2 diabetes mellitus without complications: Secondary | ICD-10-CM | POA: Diagnosis not present

## 2014-12-18 DIAGNOSIS — Z992 Dependence on renal dialysis: Secondary | ICD-10-CM | POA: Diagnosis not present

## 2014-12-18 DIAGNOSIS — N2581 Secondary hyperparathyroidism of renal origin: Secondary | ICD-10-CM | POA: Diagnosis not present

## 2014-12-18 DIAGNOSIS — E119 Type 2 diabetes mellitus without complications: Secondary | ICD-10-CM | POA: Diagnosis not present

## 2014-12-18 DIAGNOSIS — D631 Anemia in chronic kidney disease: Secondary | ICD-10-CM | POA: Diagnosis not present

## 2014-12-18 DIAGNOSIS — N186 End stage renal disease: Secondary | ICD-10-CM | POA: Diagnosis not present

## 2014-12-18 DIAGNOSIS — D509 Iron deficiency anemia, unspecified: Secondary | ICD-10-CM | POA: Diagnosis not present

## 2014-12-19 ENCOUNTER — Other Ambulatory Visit: Payer: Self-pay | Admitting: Cardiology

## 2014-12-20 DIAGNOSIS — D509 Iron deficiency anemia, unspecified: Secondary | ICD-10-CM | POA: Diagnosis not present

## 2014-12-20 DIAGNOSIS — Z992 Dependence on renal dialysis: Secondary | ICD-10-CM | POA: Diagnosis not present

## 2014-12-20 DIAGNOSIS — E119 Type 2 diabetes mellitus without complications: Secondary | ICD-10-CM | POA: Diagnosis not present

## 2014-12-20 DIAGNOSIS — N2581 Secondary hyperparathyroidism of renal origin: Secondary | ICD-10-CM | POA: Diagnosis not present

## 2014-12-20 DIAGNOSIS — D631 Anemia in chronic kidney disease: Secondary | ICD-10-CM | POA: Diagnosis not present

## 2014-12-20 DIAGNOSIS — N186 End stage renal disease: Secondary | ICD-10-CM | POA: Diagnosis not present

## 2014-12-22 DIAGNOSIS — D509 Iron deficiency anemia, unspecified: Secondary | ICD-10-CM | POA: Diagnosis not present

## 2014-12-22 DIAGNOSIS — Z992 Dependence on renal dialysis: Secondary | ICD-10-CM | POA: Diagnosis not present

## 2014-12-22 DIAGNOSIS — N186 End stage renal disease: Secondary | ICD-10-CM | POA: Diagnosis not present

## 2014-12-22 DIAGNOSIS — N2581 Secondary hyperparathyroidism of renal origin: Secondary | ICD-10-CM | POA: Diagnosis not present

## 2014-12-22 DIAGNOSIS — E119 Type 2 diabetes mellitus without complications: Secondary | ICD-10-CM | POA: Diagnosis not present

## 2014-12-22 DIAGNOSIS — D631 Anemia in chronic kidney disease: Secondary | ICD-10-CM | POA: Diagnosis not present

## 2014-12-25 DIAGNOSIS — N186 End stage renal disease: Secondary | ICD-10-CM | POA: Diagnosis not present

## 2014-12-25 DIAGNOSIS — N2581 Secondary hyperparathyroidism of renal origin: Secondary | ICD-10-CM | POA: Diagnosis not present

## 2014-12-25 DIAGNOSIS — D631 Anemia in chronic kidney disease: Secondary | ICD-10-CM | POA: Diagnosis not present

## 2014-12-25 DIAGNOSIS — Z992 Dependence on renal dialysis: Secondary | ICD-10-CM | POA: Diagnosis not present

## 2014-12-25 DIAGNOSIS — E119 Type 2 diabetes mellitus without complications: Secondary | ICD-10-CM | POA: Diagnosis not present

## 2014-12-25 DIAGNOSIS — D509 Iron deficiency anemia, unspecified: Secondary | ICD-10-CM | POA: Diagnosis not present

## 2014-12-27 DIAGNOSIS — Z992 Dependence on renal dialysis: Secondary | ICD-10-CM | POA: Diagnosis not present

## 2014-12-27 DIAGNOSIS — D631 Anemia in chronic kidney disease: Secondary | ICD-10-CM | POA: Diagnosis not present

## 2014-12-27 DIAGNOSIS — N2581 Secondary hyperparathyroidism of renal origin: Secondary | ICD-10-CM | POA: Diagnosis not present

## 2014-12-27 DIAGNOSIS — D509 Iron deficiency anemia, unspecified: Secondary | ICD-10-CM | POA: Diagnosis not present

## 2014-12-27 DIAGNOSIS — N186 End stage renal disease: Secondary | ICD-10-CM | POA: Diagnosis not present

## 2014-12-27 DIAGNOSIS — E119 Type 2 diabetes mellitus without complications: Secondary | ICD-10-CM | POA: Diagnosis not present

## 2014-12-30 DIAGNOSIS — D509 Iron deficiency anemia, unspecified: Secondary | ICD-10-CM | POA: Diagnosis not present

## 2014-12-30 DIAGNOSIS — N186 End stage renal disease: Secondary | ICD-10-CM | POA: Diagnosis not present

## 2014-12-30 DIAGNOSIS — N2581 Secondary hyperparathyroidism of renal origin: Secondary | ICD-10-CM | POA: Diagnosis not present

## 2014-12-30 DIAGNOSIS — D631 Anemia in chronic kidney disease: Secondary | ICD-10-CM | POA: Diagnosis not present

## 2014-12-30 DIAGNOSIS — Z992 Dependence on renal dialysis: Secondary | ICD-10-CM | POA: Diagnosis not present

## 2014-12-30 DIAGNOSIS — E119 Type 2 diabetes mellitus without complications: Secondary | ICD-10-CM | POA: Diagnosis not present

## 2015-01-01 DIAGNOSIS — N2581 Secondary hyperparathyroidism of renal origin: Secondary | ICD-10-CM | POA: Diagnosis not present

## 2015-01-01 DIAGNOSIS — D509 Iron deficiency anemia, unspecified: Secondary | ICD-10-CM | POA: Diagnosis not present

## 2015-01-01 DIAGNOSIS — Z992 Dependence on renal dialysis: Secondary | ICD-10-CM | POA: Diagnosis not present

## 2015-01-01 DIAGNOSIS — D631 Anemia in chronic kidney disease: Secondary | ICD-10-CM | POA: Diagnosis not present

## 2015-01-01 DIAGNOSIS — E119 Type 2 diabetes mellitus without complications: Secondary | ICD-10-CM | POA: Diagnosis not present

## 2015-01-01 DIAGNOSIS — N186 End stage renal disease: Secondary | ICD-10-CM | POA: Diagnosis not present

## 2015-01-03 DIAGNOSIS — N2581 Secondary hyperparathyroidism of renal origin: Secondary | ICD-10-CM | POA: Diagnosis not present

## 2015-01-03 DIAGNOSIS — I12 Hypertensive chronic kidney disease with 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: Secondary | ICD-10-CM | POA: Diagnosis not present

## 2015-01-03 DIAGNOSIS — D509 Iron deficiency anemia, unspecified: Secondary | ICD-10-CM | POA: Diagnosis not present

## 2015-01-03 DIAGNOSIS — E119 Type 2 diabetes mellitus without complications: Secondary | ICD-10-CM | POA: Diagnosis not present

## 2015-01-03 DIAGNOSIS — Z992 Dependence on renal dialysis: Secondary | ICD-10-CM | POA: Diagnosis not present

## 2015-01-03 DIAGNOSIS — D631 Anemia in chronic kidney disease: Secondary | ICD-10-CM | POA: Diagnosis not present

## 2015-01-03 DIAGNOSIS — N186 End stage renal disease: Secondary | ICD-10-CM | POA: Diagnosis not present

## 2015-01-05 DIAGNOSIS — D509 Iron deficiency anemia, unspecified: Secondary | ICD-10-CM | POA: Diagnosis not present

## 2015-01-05 DIAGNOSIS — D631 Anemia in chronic kidney disease: Secondary | ICD-10-CM | POA: Diagnosis not present

## 2015-01-05 DIAGNOSIS — N2581 Secondary hyperparathyroidism of renal origin: Secondary | ICD-10-CM | POA: Diagnosis not present

## 2015-01-05 DIAGNOSIS — N186 End stage renal disease: Secondary | ICD-10-CM | POA: Diagnosis not present

## 2015-01-05 DIAGNOSIS — Z992 Dependence on renal dialysis: Secondary | ICD-10-CM | POA: Diagnosis not present

## 2015-01-08 DIAGNOSIS — D509 Iron deficiency anemia, unspecified: Secondary | ICD-10-CM | POA: Diagnosis not present

## 2015-01-08 DIAGNOSIS — D631 Anemia in chronic kidney disease: Secondary | ICD-10-CM | POA: Diagnosis not present

## 2015-01-08 DIAGNOSIS — N2581 Secondary hyperparathyroidism of renal origin: Secondary | ICD-10-CM | POA: Diagnosis not present

## 2015-01-08 DIAGNOSIS — N186 End stage renal disease: Secondary | ICD-10-CM | POA: Diagnosis not present

## 2015-01-08 DIAGNOSIS — Z992 Dependence on renal dialysis: Secondary | ICD-10-CM | POA: Diagnosis not present

## 2015-01-10 DIAGNOSIS — D631 Anemia in chronic kidney disease: Secondary | ICD-10-CM | POA: Diagnosis not present

## 2015-01-10 DIAGNOSIS — N186 End stage renal disease: Secondary | ICD-10-CM | POA: Diagnosis not present

## 2015-01-10 DIAGNOSIS — Z992 Dependence on renal dialysis: Secondary | ICD-10-CM | POA: Diagnosis not present

## 2015-01-10 DIAGNOSIS — N2581 Secondary hyperparathyroidism of renal origin: Secondary | ICD-10-CM | POA: Diagnosis not present

## 2015-01-10 DIAGNOSIS — D509 Iron deficiency anemia, unspecified: Secondary | ICD-10-CM | POA: Diagnosis not present

## 2015-01-15 DIAGNOSIS — D631 Anemia in chronic kidney disease: Secondary | ICD-10-CM | POA: Diagnosis not present

## 2015-01-15 DIAGNOSIS — D509 Iron deficiency anemia, unspecified: Secondary | ICD-10-CM | POA: Diagnosis not present

## 2015-01-15 DIAGNOSIS — Z992 Dependence on renal dialysis: Secondary | ICD-10-CM | POA: Diagnosis not present

## 2015-01-15 DIAGNOSIS — N2581 Secondary hyperparathyroidism of renal origin: Secondary | ICD-10-CM | POA: Diagnosis not present

## 2015-01-15 DIAGNOSIS — N186 End stage renal disease: Secondary | ICD-10-CM | POA: Diagnosis not present

## 2015-01-17 DIAGNOSIS — N186 End stage renal disease: Secondary | ICD-10-CM | POA: Diagnosis not present

## 2015-01-17 DIAGNOSIS — D509 Iron deficiency anemia, unspecified: Secondary | ICD-10-CM | POA: Diagnosis not present

## 2015-01-17 DIAGNOSIS — Z992 Dependence on renal dialysis: Secondary | ICD-10-CM | POA: Diagnosis not present

## 2015-01-17 DIAGNOSIS — D631 Anemia in chronic kidney disease: Secondary | ICD-10-CM | POA: Diagnosis not present

## 2015-01-17 DIAGNOSIS — N2581 Secondary hyperparathyroidism of renal origin: Secondary | ICD-10-CM | POA: Diagnosis not present

## 2015-01-19 DIAGNOSIS — N2581 Secondary hyperparathyroidism of renal origin: Secondary | ICD-10-CM | POA: Diagnosis not present

## 2015-01-19 DIAGNOSIS — D631 Anemia in chronic kidney disease: Secondary | ICD-10-CM | POA: Diagnosis not present

## 2015-01-19 DIAGNOSIS — N186 End stage renal disease: Secondary | ICD-10-CM | POA: Diagnosis not present

## 2015-01-19 DIAGNOSIS — D509 Iron deficiency anemia, unspecified: Secondary | ICD-10-CM | POA: Diagnosis not present

## 2015-01-19 DIAGNOSIS — Z992 Dependence on renal dialysis: Secondary | ICD-10-CM | POA: Diagnosis not present

## 2015-01-22 DIAGNOSIS — D631 Anemia in chronic kidney disease: Secondary | ICD-10-CM | POA: Diagnosis not present

## 2015-01-22 DIAGNOSIS — N186 End stage renal disease: Secondary | ICD-10-CM | POA: Diagnosis not present

## 2015-01-22 DIAGNOSIS — N2581 Secondary hyperparathyroidism of renal origin: Secondary | ICD-10-CM | POA: Diagnosis not present

## 2015-01-22 DIAGNOSIS — Z992 Dependence on renal dialysis: Secondary | ICD-10-CM | POA: Diagnosis not present

## 2015-01-22 DIAGNOSIS — D509 Iron deficiency anemia, unspecified: Secondary | ICD-10-CM | POA: Diagnosis not present

## 2015-01-24 DIAGNOSIS — D631 Anemia in chronic kidney disease: Secondary | ICD-10-CM | POA: Diagnosis not present

## 2015-01-24 DIAGNOSIS — N2581 Secondary hyperparathyroidism of renal origin: Secondary | ICD-10-CM | POA: Diagnosis not present

## 2015-01-24 DIAGNOSIS — D509 Iron deficiency anemia, unspecified: Secondary | ICD-10-CM | POA: Diagnosis not present

## 2015-01-24 DIAGNOSIS — Z992 Dependence on renal dialysis: Secondary | ICD-10-CM | POA: Diagnosis not present

## 2015-01-24 DIAGNOSIS — N186 End stage renal disease: Secondary | ICD-10-CM | POA: Diagnosis not present

## 2015-01-26 DIAGNOSIS — Z992 Dependence on renal dialysis: Secondary | ICD-10-CM | POA: Diagnosis not present

## 2015-01-26 DIAGNOSIS — D509 Iron deficiency anemia, unspecified: Secondary | ICD-10-CM | POA: Diagnosis not present

## 2015-01-26 DIAGNOSIS — N2581 Secondary hyperparathyroidism of renal origin: Secondary | ICD-10-CM | POA: Diagnosis not present

## 2015-01-26 DIAGNOSIS — N186 End stage renal disease: Secondary | ICD-10-CM | POA: Diagnosis not present

## 2015-01-26 DIAGNOSIS — D631 Anemia in chronic kidney disease: Secondary | ICD-10-CM | POA: Diagnosis not present

## 2015-01-29 DIAGNOSIS — N186 End stage renal disease: Secondary | ICD-10-CM | POA: Diagnosis not present

## 2015-01-29 DIAGNOSIS — Z992 Dependence on renal dialysis: Secondary | ICD-10-CM | POA: Diagnosis not present

## 2015-01-29 DIAGNOSIS — D631 Anemia in chronic kidney disease: Secondary | ICD-10-CM | POA: Diagnosis not present

## 2015-01-29 DIAGNOSIS — D509 Iron deficiency anemia, unspecified: Secondary | ICD-10-CM | POA: Diagnosis not present

## 2015-01-29 DIAGNOSIS — N2581 Secondary hyperparathyroidism of renal origin: Secondary | ICD-10-CM | POA: Diagnosis not present

## 2015-01-31 DIAGNOSIS — D631 Anemia in chronic kidney disease: Secondary | ICD-10-CM | POA: Diagnosis not present

## 2015-01-31 DIAGNOSIS — N2581 Secondary hyperparathyroidism of renal origin: Secondary | ICD-10-CM | POA: Diagnosis not present

## 2015-01-31 DIAGNOSIS — N186 End stage renal disease: Secondary | ICD-10-CM | POA: Diagnosis not present

## 2015-01-31 DIAGNOSIS — Z992 Dependence on renal dialysis: Secondary | ICD-10-CM | POA: Diagnosis not present

## 2015-01-31 DIAGNOSIS — D509 Iron deficiency anemia, unspecified: Secondary | ICD-10-CM | POA: Diagnosis not present

## 2015-02-02 DIAGNOSIS — D631 Anemia in chronic kidney disease: Secondary | ICD-10-CM | POA: Diagnosis not present

## 2015-02-02 DIAGNOSIS — N186 End stage renal disease: Secondary | ICD-10-CM | POA: Diagnosis not present

## 2015-02-02 DIAGNOSIS — N2581 Secondary hyperparathyroidism of renal origin: Secondary | ICD-10-CM | POA: Diagnosis not present

## 2015-02-02 DIAGNOSIS — D509 Iron deficiency anemia, unspecified: Secondary | ICD-10-CM | POA: Diagnosis not present

## 2015-02-02 DIAGNOSIS — Z992 Dependence on renal dialysis: Secondary | ICD-10-CM | POA: Diagnosis not present

## 2015-02-03 DIAGNOSIS — I12 Hypertensive chronic kidney disease with 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: Secondary | ICD-10-CM | POA: Diagnosis not present

## 2015-02-03 DIAGNOSIS — Z992 Dependence on renal dialysis: Secondary | ICD-10-CM | POA: Diagnosis not present

## 2015-02-03 DIAGNOSIS — N186 End stage renal disease: Secondary | ICD-10-CM | POA: Diagnosis not present

## 2015-02-05 DIAGNOSIS — D509 Iron deficiency anemia, unspecified: Secondary | ICD-10-CM | POA: Diagnosis not present

## 2015-02-05 DIAGNOSIS — Z992 Dependence on renal dialysis: Secondary | ICD-10-CM | POA: Diagnosis not present

## 2015-02-05 DIAGNOSIS — N2581 Secondary hyperparathyroidism of renal origin: Secondary | ICD-10-CM | POA: Diagnosis not present

## 2015-02-05 DIAGNOSIS — D631 Anemia in chronic kidney disease: Secondary | ICD-10-CM | POA: Diagnosis not present

## 2015-02-05 DIAGNOSIS — E119 Type 2 diabetes mellitus without complications: Secondary | ICD-10-CM | POA: Diagnosis not present

## 2015-02-05 DIAGNOSIS — N186 End stage renal disease: Secondary | ICD-10-CM | POA: Diagnosis not present

## 2015-02-07 DIAGNOSIS — N186 End stage renal disease: Secondary | ICD-10-CM | POA: Diagnosis not present

## 2015-02-07 DIAGNOSIS — D509 Iron deficiency anemia, unspecified: Secondary | ICD-10-CM | POA: Diagnosis not present

## 2015-02-07 DIAGNOSIS — Z992 Dependence on renal dialysis: Secondary | ICD-10-CM | POA: Diagnosis not present

## 2015-02-07 DIAGNOSIS — E119 Type 2 diabetes mellitus without complications: Secondary | ICD-10-CM | POA: Diagnosis not present

## 2015-02-07 DIAGNOSIS — D631 Anemia in chronic kidney disease: Secondary | ICD-10-CM | POA: Diagnosis not present

## 2015-02-07 DIAGNOSIS — N2581 Secondary hyperparathyroidism of renal origin: Secondary | ICD-10-CM | POA: Diagnosis not present

## 2015-02-09 DIAGNOSIS — N186 End stage renal disease: Secondary | ICD-10-CM | POA: Diagnosis not present

## 2015-02-09 DIAGNOSIS — E119 Type 2 diabetes mellitus without complications: Secondary | ICD-10-CM | POA: Diagnosis not present

## 2015-02-09 DIAGNOSIS — D509 Iron deficiency anemia, unspecified: Secondary | ICD-10-CM | POA: Diagnosis not present

## 2015-02-09 DIAGNOSIS — Z992 Dependence on renal dialysis: Secondary | ICD-10-CM | POA: Diagnosis not present

## 2015-02-09 DIAGNOSIS — D631 Anemia in chronic kidney disease: Secondary | ICD-10-CM | POA: Diagnosis not present

## 2015-02-09 DIAGNOSIS — N2581 Secondary hyperparathyroidism of renal origin: Secondary | ICD-10-CM | POA: Diagnosis not present

## 2015-02-12 DIAGNOSIS — N186 End stage renal disease: Secondary | ICD-10-CM | POA: Diagnosis not present

## 2015-02-12 DIAGNOSIS — D631 Anemia in chronic kidney disease: Secondary | ICD-10-CM | POA: Diagnosis not present

## 2015-02-12 DIAGNOSIS — E119 Type 2 diabetes mellitus without complications: Secondary | ICD-10-CM | POA: Diagnosis not present

## 2015-02-12 DIAGNOSIS — D509 Iron deficiency anemia, unspecified: Secondary | ICD-10-CM | POA: Diagnosis not present

## 2015-02-12 DIAGNOSIS — Z992 Dependence on renal dialysis: Secondary | ICD-10-CM | POA: Diagnosis not present

## 2015-02-12 DIAGNOSIS — N2581 Secondary hyperparathyroidism of renal origin: Secondary | ICD-10-CM | POA: Diagnosis not present

## 2015-02-14 DIAGNOSIS — E119 Type 2 diabetes mellitus without complications: Secondary | ICD-10-CM | POA: Diagnosis not present

## 2015-02-14 DIAGNOSIS — N186 End stage renal disease: Secondary | ICD-10-CM | POA: Diagnosis not present

## 2015-02-14 DIAGNOSIS — D631 Anemia in chronic kidney disease: Secondary | ICD-10-CM | POA: Diagnosis not present

## 2015-02-14 DIAGNOSIS — D509 Iron deficiency anemia, unspecified: Secondary | ICD-10-CM | POA: Diagnosis not present

## 2015-02-14 DIAGNOSIS — N2581 Secondary hyperparathyroidism of renal origin: Secondary | ICD-10-CM | POA: Diagnosis not present

## 2015-02-14 DIAGNOSIS — Z992 Dependence on renal dialysis: Secondary | ICD-10-CM | POA: Diagnosis not present

## 2015-02-16 DIAGNOSIS — D631 Anemia in chronic kidney disease: Secondary | ICD-10-CM | POA: Diagnosis not present

## 2015-02-16 DIAGNOSIS — N186 End stage renal disease: Secondary | ICD-10-CM | POA: Diagnosis not present

## 2015-02-16 DIAGNOSIS — Z992 Dependence on renal dialysis: Secondary | ICD-10-CM | POA: Diagnosis not present

## 2015-02-16 DIAGNOSIS — E119 Type 2 diabetes mellitus without complications: Secondary | ICD-10-CM | POA: Diagnosis not present

## 2015-02-16 DIAGNOSIS — N2581 Secondary hyperparathyroidism of renal origin: Secondary | ICD-10-CM | POA: Diagnosis not present

## 2015-02-16 DIAGNOSIS — D509 Iron deficiency anemia, unspecified: Secondary | ICD-10-CM | POA: Diagnosis not present

## 2015-02-19 DIAGNOSIS — D509 Iron deficiency anemia, unspecified: Secondary | ICD-10-CM | POA: Diagnosis not present

## 2015-02-19 DIAGNOSIS — E119 Type 2 diabetes mellitus without complications: Secondary | ICD-10-CM | POA: Diagnosis not present

## 2015-02-19 DIAGNOSIS — D631 Anemia in chronic kidney disease: Secondary | ICD-10-CM | POA: Diagnosis not present

## 2015-02-19 DIAGNOSIS — N186 End stage renal disease: Secondary | ICD-10-CM | POA: Diagnosis not present

## 2015-02-19 DIAGNOSIS — Z992 Dependence on renal dialysis: Secondary | ICD-10-CM | POA: Diagnosis not present

## 2015-02-19 DIAGNOSIS — N2581 Secondary hyperparathyroidism of renal origin: Secondary | ICD-10-CM | POA: Diagnosis not present

## 2015-02-21 DIAGNOSIS — E119 Type 2 diabetes mellitus without complications: Secondary | ICD-10-CM | POA: Diagnosis not present

## 2015-02-21 DIAGNOSIS — N2581 Secondary hyperparathyroidism of renal origin: Secondary | ICD-10-CM | POA: Diagnosis not present

## 2015-02-21 DIAGNOSIS — D631 Anemia in chronic kidney disease: Secondary | ICD-10-CM | POA: Diagnosis not present

## 2015-02-21 DIAGNOSIS — D509 Iron deficiency anemia, unspecified: Secondary | ICD-10-CM | POA: Diagnosis not present

## 2015-02-21 DIAGNOSIS — N186 End stage renal disease: Secondary | ICD-10-CM | POA: Diagnosis not present

## 2015-02-21 DIAGNOSIS — Z992 Dependence on renal dialysis: Secondary | ICD-10-CM | POA: Diagnosis not present

## 2015-02-23 DIAGNOSIS — N186 End stage renal disease: Secondary | ICD-10-CM | POA: Diagnosis not present

## 2015-02-23 DIAGNOSIS — D509 Iron deficiency anemia, unspecified: Secondary | ICD-10-CM | POA: Diagnosis not present

## 2015-02-23 DIAGNOSIS — Z992 Dependence on renal dialysis: Secondary | ICD-10-CM | POA: Diagnosis not present

## 2015-02-23 DIAGNOSIS — E119 Type 2 diabetes mellitus without complications: Secondary | ICD-10-CM | POA: Diagnosis not present

## 2015-02-23 DIAGNOSIS — N2581 Secondary hyperparathyroidism of renal origin: Secondary | ICD-10-CM | POA: Diagnosis not present

## 2015-02-23 DIAGNOSIS — D631 Anemia in chronic kidney disease: Secondary | ICD-10-CM | POA: Diagnosis not present

## 2015-02-26 DIAGNOSIS — Z992 Dependence on renal dialysis: Secondary | ICD-10-CM | POA: Diagnosis not present

## 2015-02-26 DIAGNOSIS — E119 Type 2 diabetes mellitus without complications: Secondary | ICD-10-CM | POA: Diagnosis not present

## 2015-02-26 DIAGNOSIS — D509 Iron deficiency anemia, unspecified: Secondary | ICD-10-CM | POA: Diagnosis not present

## 2015-02-26 DIAGNOSIS — N2581 Secondary hyperparathyroidism of renal origin: Secondary | ICD-10-CM | POA: Diagnosis not present

## 2015-02-26 DIAGNOSIS — N186 End stage renal disease: Secondary | ICD-10-CM | POA: Diagnosis not present

## 2015-02-26 DIAGNOSIS — D631 Anemia in chronic kidney disease: Secondary | ICD-10-CM | POA: Diagnosis not present

## 2015-02-28 DIAGNOSIS — D509 Iron deficiency anemia, unspecified: Secondary | ICD-10-CM | POA: Diagnosis not present

## 2015-02-28 DIAGNOSIS — Z992 Dependence on renal dialysis: Secondary | ICD-10-CM | POA: Diagnosis not present

## 2015-02-28 DIAGNOSIS — N186 End stage renal disease: Secondary | ICD-10-CM | POA: Diagnosis not present

## 2015-02-28 DIAGNOSIS — D631 Anemia in chronic kidney disease: Secondary | ICD-10-CM | POA: Diagnosis not present

## 2015-02-28 DIAGNOSIS — E119 Type 2 diabetes mellitus without complications: Secondary | ICD-10-CM | POA: Diagnosis not present

## 2015-02-28 DIAGNOSIS — N2581 Secondary hyperparathyroidism of renal origin: Secondary | ICD-10-CM | POA: Diagnosis not present

## 2015-03-02 DIAGNOSIS — E119 Type 2 diabetes mellitus without complications: Secondary | ICD-10-CM | POA: Diagnosis not present

## 2015-03-02 DIAGNOSIS — D631 Anemia in chronic kidney disease: Secondary | ICD-10-CM | POA: Diagnosis not present

## 2015-03-02 DIAGNOSIS — D509 Iron deficiency anemia, unspecified: Secondary | ICD-10-CM | POA: Diagnosis not present

## 2015-03-02 DIAGNOSIS — N2581 Secondary hyperparathyroidism of renal origin: Secondary | ICD-10-CM | POA: Diagnosis not present

## 2015-03-02 DIAGNOSIS — Z992 Dependence on renal dialysis: Secondary | ICD-10-CM | POA: Diagnosis not present

## 2015-03-02 DIAGNOSIS — N186 End stage renal disease: Secondary | ICD-10-CM | POA: Diagnosis not present

## 2015-03-05 DIAGNOSIS — N186 End stage renal disease: Secondary | ICD-10-CM | POA: Diagnosis not present

## 2015-03-05 DIAGNOSIS — Z992 Dependence on renal dialysis: Secondary | ICD-10-CM | POA: Diagnosis not present

## 2015-03-05 DIAGNOSIS — N2581 Secondary hyperparathyroidism of renal origin: Secondary | ICD-10-CM | POA: Diagnosis not present

## 2015-03-05 DIAGNOSIS — D509 Iron deficiency anemia, unspecified: Secondary | ICD-10-CM | POA: Diagnosis not present

## 2015-03-05 DIAGNOSIS — D631 Anemia in chronic kidney disease: Secondary | ICD-10-CM | POA: Diagnosis not present

## 2015-03-05 DIAGNOSIS — E119 Type 2 diabetes mellitus without complications: Secondary | ICD-10-CM | POA: Diagnosis not present

## 2015-03-06 DIAGNOSIS — N186 End stage renal disease: Secondary | ICD-10-CM | POA: Diagnosis not present

## 2015-03-06 DIAGNOSIS — I12 Hypertensive chronic kidney disease with 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: Secondary | ICD-10-CM | POA: Diagnosis not present

## 2015-03-06 DIAGNOSIS — Z992 Dependence on renal dialysis: Secondary | ICD-10-CM | POA: Diagnosis not present

## 2015-03-07 DIAGNOSIS — N2581 Secondary hyperparathyroidism of renal origin: Secondary | ICD-10-CM | POA: Diagnosis not present

## 2015-03-07 DIAGNOSIS — E119 Type 2 diabetes mellitus without complications: Secondary | ICD-10-CM | POA: Diagnosis not present

## 2015-03-07 DIAGNOSIS — D509 Iron deficiency anemia, unspecified: Secondary | ICD-10-CM | POA: Diagnosis not present

## 2015-03-07 DIAGNOSIS — D631 Anemia in chronic kidney disease: Secondary | ICD-10-CM | POA: Diagnosis not present

## 2015-03-07 DIAGNOSIS — N186 End stage renal disease: Secondary | ICD-10-CM | POA: Diagnosis not present

## 2015-03-07 DIAGNOSIS — Z992 Dependence on renal dialysis: Secondary | ICD-10-CM | POA: Diagnosis not present

## 2015-03-09 DIAGNOSIS — N2581 Secondary hyperparathyroidism of renal origin: Secondary | ICD-10-CM | POA: Diagnosis not present

## 2015-03-09 DIAGNOSIS — D509 Iron deficiency anemia, unspecified: Secondary | ICD-10-CM | POA: Diagnosis not present

## 2015-03-09 DIAGNOSIS — N186 End stage renal disease: Secondary | ICD-10-CM | POA: Diagnosis not present

## 2015-03-09 DIAGNOSIS — E119 Type 2 diabetes mellitus without complications: Secondary | ICD-10-CM | POA: Diagnosis not present

## 2015-03-09 DIAGNOSIS — Z992 Dependence on renal dialysis: Secondary | ICD-10-CM | POA: Diagnosis not present

## 2015-03-09 DIAGNOSIS — D631 Anemia in chronic kidney disease: Secondary | ICD-10-CM | POA: Diagnosis not present

## 2015-03-12 DIAGNOSIS — N2581 Secondary hyperparathyroidism of renal origin: Secondary | ICD-10-CM | POA: Diagnosis not present

## 2015-03-12 DIAGNOSIS — E119 Type 2 diabetes mellitus without complications: Secondary | ICD-10-CM | POA: Diagnosis not present

## 2015-03-12 DIAGNOSIS — Z992 Dependence on renal dialysis: Secondary | ICD-10-CM | POA: Diagnosis not present

## 2015-03-12 DIAGNOSIS — D509 Iron deficiency anemia, unspecified: Secondary | ICD-10-CM | POA: Diagnosis not present

## 2015-03-12 DIAGNOSIS — N186 End stage renal disease: Secondary | ICD-10-CM | POA: Diagnosis not present

## 2015-03-12 DIAGNOSIS — D631 Anemia in chronic kidney disease: Secondary | ICD-10-CM | POA: Diagnosis not present

## 2015-03-14 DIAGNOSIS — N2581 Secondary hyperparathyroidism of renal origin: Secondary | ICD-10-CM | POA: Diagnosis not present

## 2015-03-14 DIAGNOSIS — Z992 Dependence on renal dialysis: Secondary | ICD-10-CM | POA: Diagnosis not present

## 2015-03-14 DIAGNOSIS — E119 Type 2 diabetes mellitus without complications: Secondary | ICD-10-CM | POA: Diagnosis not present

## 2015-03-14 DIAGNOSIS — D631 Anemia in chronic kidney disease: Secondary | ICD-10-CM | POA: Diagnosis not present

## 2015-03-14 DIAGNOSIS — N186 End stage renal disease: Secondary | ICD-10-CM | POA: Diagnosis not present

## 2015-03-14 DIAGNOSIS — D509 Iron deficiency anemia, unspecified: Secondary | ICD-10-CM | POA: Diagnosis not present

## 2015-03-16 DIAGNOSIS — N2581 Secondary hyperparathyroidism of renal origin: Secondary | ICD-10-CM | POA: Diagnosis not present

## 2015-03-16 DIAGNOSIS — E119 Type 2 diabetes mellitus without complications: Secondary | ICD-10-CM | POA: Diagnosis not present

## 2015-03-16 DIAGNOSIS — D509 Iron deficiency anemia, unspecified: Secondary | ICD-10-CM | POA: Diagnosis not present

## 2015-03-16 DIAGNOSIS — N186 End stage renal disease: Secondary | ICD-10-CM | POA: Diagnosis not present

## 2015-03-16 DIAGNOSIS — D631 Anemia in chronic kidney disease: Secondary | ICD-10-CM | POA: Diagnosis not present

## 2015-03-16 DIAGNOSIS — Z992 Dependence on renal dialysis: Secondary | ICD-10-CM | POA: Diagnosis not present

## 2015-03-19 DIAGNOSIS — Z992 Dependence on renal dialysis: Secondary | ICD-10-CM | POA: Diagnosis not present

## 2015-03-19 DIAGNOSIS — D631 Anemia in chronic kidney disease: Secondary | ICD-10-CM | POA: Diagnosis not present

## 2015-03-19 DIAGNOSIS — D509 Iron deficiency anemia, unspecified: Secondary | ICD-10-CM | POA: Diagnosis not present

## 2015-03-19 DIAGNOSIS — N186 End stage renal disease: Secondary | ICD-10-CM | POA: Diagnosis not present

## 2015-03-19 DIAGNOSIS — N2581 Secondary hyperparathyroidism of renal origin: Secondary | ICD-10-CM | POA: Diagnosis not present

## 2015-03-19 DIAGNOSIS — E119 Type 2 diabetes mellitus without complications: Secondary | ICD-10-CM | POA: Diagnosis not present

## 2015-03-21 DIAGNOSIS — N2581 Secondary hyperparathyroidism of renal origin: Secondary | ICD-10-CM | POA: Diagnosis not present

## 2015-03-21 DIAGNOSIS — D631 Anemia in chronic kidney disease: Secondary | ICD-10-CM | POA: Diagnosis not present

## 2015-03-21 DIAGNOSIS — N186 End stage renal disease: Secondary | ICD-10-CM | POA: Diagnosis not present

## 2015-03-21 DIAGNOSIS — E119 Type 2 diabetes mellitus without complications: Secondary | ICD-10-CM | POA: Diagnosis not present

## 2015-03-21 DIAGNOSIS — Z992 Dependence on renal dialysis: Secondary | ICD-10-CM | POA: Diagnosis not present

## 2015-03-21 DIAGNOSIS — D509 Iron deficiency anemia, unspecified: Secondary | ICD-10-CM | POA: Diagnosis not present

## 2015-03-22 DIAGNOSIS — Z452 Encounter for adjustment and management of vascular access device: Secondary | ICD-10-CM | POA: Diagnosis not present

## 2015-03-23 DIAGNOSIS — N2581 Secondary hyperparathyroidism of renal origin: Secondary | ICD-10-CM | POA: Diagnosis not present

## 2015-03-23 DIAGNOSIS — N186 End stage renal disease: Secondary | ICD-10-CM | POA: Diagnosis not present

## 2015-03-23 DIAGNOSIS — D509 Iron deficiency anemia, unspecified: Secondary | ICD-10-CM | POA: Diagnosis not present

## 2015-03-23 DIAGNOSIS — Z992 Dependence on renal dialysis: Secondary | ICD-10-CM | POA: Diagnosis not present

## 2015-03-23 DIAGNOSIS — E119 Type 2 diabetes mellitus without complications: Secondary | ICD-10-CM | POA: Diagnosis not present

## 2015-03-23 DIAGNOSIS — D631 Anemia in chronic kidney disease: Secondary | ICD-10-CM | POA: Diagnosis not present

## 2015-03-26 DIAGNOSIS — N186 End stage renal disease: Secondary | ICD-10-CM | POA: Diagnosis not present

## 2015-03-26 DIAGNOSIS — N2581 Secondary hyperparathyroidism of renal origin: Secondary | ICD-10-CM | POA: Diagnosis not present

## 2015-03-26 DIAGNOSIS — E119 Type 2 diabetes mellitus without complications: Secondary | ICD-10-CM | POA: Diagnosis not present

## 2015-03-26 DIAGNOSIS — D631 Anemia in chronic kidney disease: Secondary | ICD-10-CM | POA: Diagnosis not present

## 2015-03-26 DIAGNOSIS — D509 Iron deficiency anemia, unspecified: Secondary | ICD-10-CM | POA: Diagnosis not present

## 2015-03-26 DIAGNOSIS — Z992 Dependence on renal dialysis: Secondary | ICD-10-CM | POA: Diagnosis not present

## 2015-03-28 DIAGNOSIS — E119 Type 2 diabetes mellitus without complications: Secondary | ICD-10-CM | POA: Diagnosis not present

## 2015-03-28 DIAGNOSIS — N186 End stage renal disease: Secondary | ICD-10-CM | POA: Diagnosis not present

## 2015-03-28 DIAGNOSIS — N2581 Secondary hyperparathyroidism of renal origin: Secondary | ICD-10-CM | POA: Diagnosis not present

## 2015-03-28 DIAGNOSIS — Z992 Dependence on renal dialysis: Secondary | ICD-10-CM | POA: Diagnosis not present

## 2015-03-28 DIAGNOSIS — D631 Anemia in chronic kidney disease: Secondary | ICD-10-CM | POA: Diagnosis not present

## 2015-03-28 DIAGNOSIS — D509 Iron deficiency anemia, unspecified: Secondary | ICD-10-CM | POA: Diagnosis not present

## 2015-03-30 DIAGNOSIS — N2581 Secondary hyperparathyroidism of renal origin: Secondary | ICD-10-CM | POA: Diagnosis not present

## 2015-03-30 DIAGNOSIS — Z992 Dependence on renal dialysis: Secondary | ICD-10-CM | POA: Diagnosis not present

## 2015-03-30 DIAGNOSIS — N186 End stage renal disease: Secondary | ICD-10-CM | POA: Diagnosis not present

## 2015-03-30 DIAGNOSIS — E119 Type 2 diabetes mellitus without complications: Secondary | ICD-10-CM | POA: Diagnosis not present

## 2015-03-30 DIAGNOSIS — D509 Iron deficiency anemia, unspecified: Secondary | ICD-10-CM | POA: Diagnosis not present

## 2015-03-30 DIAGNOSIS — D631 Anemia in chronic kidney disease: Secondary | ICD-10-CM | POA: Diagnosis not present

## 2015-04-02 DIAGNOSIS — Z992 Dependence on renal dialysis: Secondary | ICD-10-CM | POA: Diagnosis not present

## 2015-04-02 DIAGNOSIS — N186 End stage renal disease: Secondary | ICD-10-CM | POA: Diagnosis not present

## 2015-04-02 DIAGNOSIS — E119 Type 2 diabetes mellitus without complications: Secondary | ICD-10-CM | POA: Diagnosis not present

## 2015-04-02 DIAGNOSIS — D509 Iron deficiency anemia, unspecified: Secondary | ICD-10-CM | POA: Diagnosis not present

## 2015-04-02 DIAGNOSIS — N2581 Secondary hyperparathyroidism of renal origin: Secondary | ICD-10-CM | POA: Diagnosis not present

## 2015-04-02 DIAGNOSIS — D631 Anemia in chronic kidney disease: Secondary | ICD-10-CM | POA: Diagnosis not present

## 2015-04-03 DIAGNOSIS — Z992 Dependence on renal dialysis: Secondary | ICD-10-CM | POA: Diagnosis not present

## 2015-04-03 DIAGNOSIS — I12 Hypertensive chronic kidney disease with 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: Secondary | ICD-10-CM | POA: Diagnosis not present

## 2015-04-03 DIAGNOSIS — N186 End stage renal disease: Secondary | ICD-10-CM | POA: Diagnosis not present

## 2015-04-04 DIAGNOSIS — N2581 Secondary hyperparathyroidism of renal origin: Secondary | ICD-10-CM | POA: Diagnosis not present

## 2015-04-04 DIAGNOSIS — N186 End stage renal disease: Secondary | ICD-10-CM | POA: Diagnosis not present

## 2015-04-04 DIAGNOSIS — D509 Iron deficiency anemia, unspecified: Secondary | ICD-10-CM | POA: Diagnosis not present

## 2015-04-04 DIAGNOSIS — Z992 Dependence on renal dialysis: Secondary | ICD-10-CM | POA: Diagnosis not present

## 2015-04-04 DIAGNOSIS — D631 Anemia in chronic kidney disease: Secondary | ICD-10-CM | POA: Diagnosis not present

## 2015-04-04 DIAGNOSIS — E119 Type 2 diabetes mellitus without complications: Secondary | ICD-10-CM | POA: Diagnosis not present

## 2015-04-05 DIAGNOSIS — E785 Hyperlipidemia, unspecified: Secondary | ICD-10-CM | POA: Diagnosis not present

## 2015-04-05 DIAGNOSIS — F17200 Nicotine dependence, unspecified, uncomplicated: Secondary | ICD-10-CM | POA: Diagnosis not present

## 2015-04-05 DIAGNOSIS — Z1389 Encounter for screening for other disorder: Secondary | ICD-10-CM | POA: Diagnosis not present

## 2015-04-05 DIAGNOSIS — F17210 Nicotine dependence, cigarettes, uncomplicated: Secondary | ICD-10-CM | POA: Diagnosis not present

## 2015-04-05 DIAGNOSIS — G4700 Insomnia, unspecified: Secondary | ICD-10-CM | POA: Diagnosis not present

## 2015-04-05 DIAGNOSIS — N186 End stage renal disease: Secondary | ICD-10-CM | POA: Diagnosis not present

## 2015-04-05 DIAGNOSIS — K7460 Unspecified cirrhosis of liver: Secondary | ICD-10-CM | POA: Diagnosis not present

## 2015-04-05 DIAGNOSIS — Z0000 Encounter for general adult medical examination without abnormal findings: Secondary | ICD-10-CM | POA: Diagnosis not present

## 2015-04-05 DIAGNOSIS — N400 Enlarged prostate without lower urinary tract symptoms: Secondary | ICD-10-CM | POA: Diagnosis not present

## 2015-04-05 DIAGNOSIS — I10 Essential (primary) hypertension: Secondary | ICD-10-CM | POA: Diagnosis not present

## 2015-04-06 DIAGNOSIS — E119 Type 2 diabetes mellitus without complications: Secondary | ICD-10-CM | POA: Diagnosis not present

## 2015-04-06 DIAGNOSIS — D631 Anemia in chronic kidney disease: Secondary | ICD-10-CM | POA: Diagnosis not present

## 2015-04-06 DIAGNOSIS — N2581 Secondary hyperparathyroidism of renal origin: Secondary | ICD-10-CM | POA: Diagnosis not present

## 2015-04-06 DIAGNOSIS — D509 Iron deficiency anemia, unspecified: Secondary | ICD-10-CM | POA: Diagnosis not present

## 2015-04-06 DIAGNOSIS — N186 End stage renal disease: Secondary | ICD-10-CM | POA: Diagnosis not present

## 2015-04-06 DIAGNOSIS — Z992 Dependence on renal dialysis: Secondary | ICD-10-CM | POA: Diagnosis not present

## 2015-04-09 DIAGNOSIS — D509 Iron deficiency anemia, unspecified: Secondary | ICD-10-CM | POA: Diagnosis not present

## 2015-04-09 DIAGNOSIS — Z992 Dependence on renal dialysis: Secondary | ICD-10-CM | POA: Diagnosis not present

## 2015-04-09 DIAGNOSIS — N2581 Secondary hyperparathyroidism of renal origin: Secondary | ICD-10-CM | POA: Diagnosis not present

## 2015-04-09 DIAGNOSIS — D631 Anemia in chronic kidney disease: Secondary | ICD-10-CM | POA: Diagnosis not present

## 2015-04-09 DIAGNOSIS — N186 End stage renal disease: Secondary | ICD-10-CM | POA: Diagnosis not present

## 2015-04-09 DIAGNOSIS — E119 Type 2 diabetes mellitus without complications: Secondary | ICD-10-CM | POA: Diagnosis not present

## 2015-04-11 DIAGNOSIS — E119 Type 2 diabetes mellitus without complications: Secondary | ICD-10-CM | POA: Diagnosis not present

## 2015-04-11 DIAGNOSIS — N2581 Secondary hyperparathyroidism of renal origin: Secondary | ICD-10-CM | POA: Diagnosis not present

## 2015-04-11 DIAGNOSIS — Z992 Dependence on renal dialysis: Secondary | ICD-10-CM | POA: Diagnosis not present

## 2015-04-11 DIAGNOSIS — D509 Iron deficiency anemia, unspecified: Secondary | ICD-10-CM | POA: Diagnosis not present

## 2015-04-11 DIAGNOSIS — D631 Anemia in chronic kidney disease: Secondary | ICD-10-CM | POA: Diagnosis not present

## 2015-04-11 DIAGNOSIS — N186 End stage renal disease: Secondary | ICD-10-CM | POA: Diagnosis not present

## 2015-04-13 DIAGNOSIS — D631 Anemia in chronic kidney disease: Secondary | ICD-10-CM | POA: Diagnosis not present

## 2015-04-13 DIAGNOSIS — Z992 Dependence on renal dialysis: Secondary | ICD-10-CM | POA: Diagnosis not present

## 2015-04-13 DIAGNOSIS — D509 Iron deficiency anemia, unspecified: Secondary | ICD-10-CM | POA: Diagnosis not present

## 2015-04-13 DIAGNOSIS — N186 End stage renal disease: Secondary | ICD-10-CM | POA: Diagnosis not present

## 2015-04-13 DIAGNOSIS — E119 Type 2 diabetes mellitus without complications: Secondary | ICD-10-CM | POA: Diagnosis not present

## 2015-04-13 DIAGNOSIS — N2581 Secondary hyperparathyroidism of renal origin: Secondary | ICD-10-CM | POA: Diagnosis not present

## 2015-04-16 DIAGNOSIS — Z992 Dependence on renal dialysis: Secondary | ICD-10-CM | POA: Diagnosis not present

## 2015-04-16 DIAGNOSIS — E119 Type 2 diabetes mellitus without complications: Secondary | ICD-10-CM | POA: Diagnosis not present

## 2015-04-16 DIAGNOSIS — N186 End stage renal disease: Secondary | ICD-10-CM | POA: Diagnosis not present

## 2015-04-16 DIAGNOSIS — D509 Iron deficiency anemia, unspecified: Secondary | ICD-10-CM | POA: Diagnosis not present

## 2015-04-16 DIAGNOSIS — N2581 Secondary hyperparathyroidism of renal origin: Secondary | ICD-10-CM | POA: Diagnosis not present

## 2015-04-16 DIAGNOSIS — D631 Anemia in chronic kidney disease: Secondary | ICD-10-CM | POA: Diagnosis not present

## 2015-04-18 DIAGNOSIS — N2581 Secondary hyperparathyroidism of renal origin: Secondary | ICD-10-CM | POA: Diagnosis not present

## 2015-04-18 DIAGNOSIS — Z992 Dependence on renal dialysis: Secondary | ICD-10-CM | POA: Diagnosis not present

## 2015-04-18 DIAGNOSIS — E119 Type 2 diabetes mellitus without complications: Secondary | ICD-10-CM | POA: Diagnosis not present

## 2015-04-18 DIAGNOSIS — D631 Anemia in chronic kidney disease: Secondary | ICD-10-CM | POA: Diagnosis not present

## 2015-04-18 DIAGNOSIS — D509 Iron deficiency anemia, unspecified: Secondary | ICD-10-CM | POA: Diagnosis not present

## 2015-04-18 DIAGNOSIS — N186 End stage renal disease: Secondary | ICD-10-CM | POA: Diagnosis not present

## 2015-04-20 DIAGNOSIS — D631 Anemia in chronic kidney disease: Secondary | ICD-10-CM | POA: Diagnosis not present

## 2015-04-20 DIAGNOSIS — Z992 Dependence on renal dialysis: Secondary | ICD-10-CM | POA: Diagnosis not present

## 2015-04-20 DIAGNOSIS — N186 End stage renal disease: Secondary | ICD-10-CM | POA: Diagnosis not present

## 2015-04-20 DIAGNOSIS — D509 Iron deficiency anemia, unspecified: Secondary | ICD-10-CM | POA: Diagnosis not present

## 2015-04-20 DIAGNOSIS — N2581 Secondary hyperparathyroidism of renal origin: Secondary | ICD-10-CM | POA: Diagnosis not present

## 2015-04-20 DIAGNOSIS — E119 Type 2 diabetes mellitus without complications: Secondary | ICD-10-CM | POA: Diagnosis not present

## 2015-04-23 DIAGNOSIS — D631 Anemia in chronic kidney disease: Secondary | ICD-10-CM | POA: Diagnosis not present

## 2015-04-23 DIAGNOSIS — N2581 Secondary hyperparathyroidism of renal origin: Secondary | ICD-10-CM | POA: Diagnosis not present

## 2015-04-23 DIAGNOSIS — N186 End stage renal disease: Secondary | ICD-10-CM | POA: Diagnosis not present

## 2015-04-23 DIAGNOSIS — Z992 Dependence on renal dialysis: Secondary | ICD-10-CM | POA: Diagnosis not present

## 2015-04-23 DIAGNOSIS — E119 Type 2 diabetes mellitus without complications: Secondary | ICD-10-CM | POA: Diagnosis not present

## 2015-04-23 DIAGNOSIS — D509 Iron deficiency anemia, unspecified: Secondary | ICD-10-CM | POA: Diagnosis not present

## 2015-04-25 DIAGNOSIS — N2581 Secondary hyperparathyroidism of renal origin: Secondary | ICD-10-CM | POA: Diagnosis not present

## 2015-04-25 DIAGNOSIS — D509 Iron deficiency anemia, unspecified: Secondary | ICD-10-CM | POA: Diagnosis not present

## 2015-04-25 DIAGNOSIS — N186 End stage renal disease: Secondary | ICD-10-CM | POA: Diagnosis not present

## 2015-04-25 DIAGNOSIS — Z992 Dependence on renal dialysis: Secondary | ICD-10-CM | POA: Diagnosis not present

## 2015-04-25 DIAGNOSIS — D631 Anemia in chronic kidney disease: Secondary | ICD-10-CM | POA: Diagnosis not present

## 2015-04-25 DIAGNOSIS — E119 Type 2 diabetes mellitus without complications: Secondary | ICD-10-CM | POA: Diagnosis not present

## 2015-04-27 DIAGNOSIS — D509 Iron deficiency anemia, unspecified: Secondary | ICD-10-CM | POA: Diagnosis not present

## 2015-04-27 DIAGNOSIS — D631 Anemia in chronic kidney disease: Secondary | ICD-10-CM | POA: Diagnosis not present

## 2015-04-27 DIAGNOSIS — N2581 Secondary hyperparathyroidism of renal origin: Secondary | ICD-10-CM | POA: Diagnosis not present

## 2015-04-27 DIAGNOSIS — Z992 Dependence on renal dialysis: Secondary | ICD-10-CM | POA: Diagnosis not present

## 2015-04-27 DIAGNOSIS — E119 Type 2 diabetes mellitus without complications: Secondary | ICD-10-CM | POA: Diagnosis not present

## 2015-04-27 DIAGNOSIS — N186 End stage renal disease: Secondary | ICD-10-CM | POA: Diagnosis not present

## 2015-04-30 DIAGNOSIS — E119 Type 2 diabetes mellitus without complications: Secondary | ICD-10-CM | POA: Diagnosis not present

## 2015-04-30 DIAGNOSIS — N186 End stage renal disease: Secondary | ICD-10-CM | POA: Diagnosis not present

## 2015-04-30 DIAGNOSIS — D509 Iron deficiency anemia, unspecified: Secondary | ICD-10-CM | POA: Diagnosis not present

## 2015-04-30 DIAGNOSIS — N2581 Secondary hyperparathyroidism of renal origin: Secondary | ICD-10-CM | POA: Diagnosis not present

## 2015-04-30 DIAGNOSIS — D631 Anemia in chronic kidney disease: Secondary | ICD-10-CM | POA: Diagnosis not present

## 2015-04-30 DIAGNOSIS — Z992 Dependence on renal dialysis: Secondary | ICD-10-CM | POA: Diagnosis not present

## 2015-05-02 DIAGNOSIS — D509 Iron deficiency anemia, unspecified: Secondary | ICD-10-CM | POA: Diagnosis not present

## 2015-05-02 DIAGNOSIS — N2581 Secondary hyperparathyroidism of renal origin: Secondary | ICD-10-CM | POA: Diagnosis not present

## 2015-05-02 DIAGNOSIS — E119 Type 2 diabetes mellitus without complications: Secondary | ICD-10-CM | POA: Diagnosis not present

## 2015-05-02 DIAGNOSIS — Z992 Dependence on renal dialysis: Secondary | ICD-10-CM | POA: Diagnosis not present

## 2015-05-02 DIAGNOSIS — D631 Anemia in chronic kidney disease: Secondary | ICD-10-CM | POA: Diagnosis not present

## 2015-05-02 DIAGNOSIS — N186 End stage renal disease: Secondary | ICD-10-CM | POA: Diagnosis not present

## 2015-05-04 DIAGNOSIS — Z992 Dependence on renal dialysis: Secondary | ICD-10-CM | POA: Diagnosis not present

## 2015-05-04 DIAGNOSIS — I12 Hypertensive chronic kidney disease with 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: Secondary | ICD-10-CM | POA: Diagnosis not present

## 2015-05-04 DIAGNOSIS — D631 Anemia in chronic kidney disease: Secondary | ICD-10-CM | POA: Diagnosis not present

## 2015-05-04 DIAGNOSIS — N186 End stage renal disease: Secondary | ICD-10-CM | POA: Diagnosis not present

## 2015-05-04 DIAGNOSIS — E119 Type 2 diabetes mellitus without complications: Secondary | ICD-10-CM | POA: Diagnosis not present

## 2015-05-04 DIAGNOSIS — N2581 Secondary hyperparathyroidism of renal origin: Secondary | ICD-10-CM | POA: Diagnosis not present

## 2015-05-04 DIAGNOSIS — D509 Iron deficiency anemia, unspecified: Secondary | ICD-10-CM | POA: Diagnosis not present

## 2015-05-07 DIAGNOSIS — E119 Type 2 diabetes mellitus without complications: Secondary | ICD-10-CM | POA: Diagnosis not present

## 2015-05-07 DIAGNOSIS — D509 Iron deficiency anemia, unspecified: Secondary | ICD-10-CM | POA: Diagnosis not present

## 2015-05-07 DIAGNOSIS — D631 Anemia in chronic kidney disease: Secondary | ICD-10-CM | POA: Diagnosis not present

## 2015-05-07 DIAGNOSIS — Z992 Dependence on renal dialysis: Secondary | ICD-10-CM | POA: Diagnosis not present

## 2015-05-07 DIAGNOSIS — N2581 Secondary hyperparathyroidism of renal origin: Secondary | ICD-10-CM | POA: Diagnosis not present

## 2015-05-07 DIAGNOSIS — N186 End stage renal disease: Secondary | ICD-10-CM | POA: Diagnosis not present

## 2015-05-09 DIAGNOSIS — E119 Type 2 diabetes mellitus without complications: Secondary | ICD-10-CM | POA: Diagnosis not present

## 2015-05-09 DIAGNOSIS — Z992 Dependence on renal dialysis: Secondary | ICD-10-CM | POA: Diagnosis not present

## 2015-05-09 DIAGNOSIS — D631 Anemia in chronic kidney disease: Secondary | ICD-10-CM | POA: Diagnosis not present

## 2015-05-09 DIAGNOSIS — N186 End stage renal disease: Secondary | ICD-10-CM | POA: Diagnosis not present

## 2015-05-09 DIAGNOSIS — D509 Iron deficiency anemia, unspecified: Secondary | ICD-10-CM | POA: Diagnosis not present

## 2015-05-09 DIAGNOSIS — N2581 Secondary hyperparathyroidism of renal origin: Secondary | ICD-10-CM | POA: Diagnosis not present

## 2015-05-11 DIAGNOSIS — E119 Type 2 diabetes mellitus without complications: Secondary | ICD-10-CM | POA: Diagnosis not present

## 2015-05-11 DIAGNOSIS — N186 End stage renal disease: Secondary | ICD-10-CM | POA: Diagnosis not present

## 2015-05-11 DIAGNOSIS — D509 Iron deficiency anemia, unspecified: Secondary | ICD-10-CM | POA: Diagnosis not present

## 2015-05-11 DIAGNOSIS — N2581 Secondary hyperparathyroidism of renal origin: Secondary | ICD-10-CM | POA: Diagnosis not present

## 2015-05-11 DIAGNOSIS — D631 Anemia in chronic kidney disease: Secondary | ICD-10-CM | POA: Diagnosis not present

## 2015-05-11 DIAGNOSIS — Z992 Dependence on renal dialysis: Secondary | ICD-10-CM | POA: Diagnosis not present

## 2015-05-14 DIAGNOSIS — N186 End stage renal disease: Secondary | ICD-10-CM | POA: Diagnosis not present

## 2015-05-14 DIAGNOSIS — E119 Type 2 diabetes mellitus without complications: Secondary | ICD-10-CM | POA: Diagnosis not present

## 2015-05-14 DIAGNOSIS — N2581 Secondary hyperparathyroidism of renal origin: Secondary | ICD-10-CM | POA: Diagnosis not present

## 2015-05-14 DIAGNOSIS — D631 Anemia in chronic kidney disease: Secondary | ICD-10-CM | POA: Diagnosis not present

## 2015-05-14 DIAGNOSIS — D509 Iron deficiency anemia, unspecified: Secondary | ICD-10-CM | POA: Diagnosis not present

## 2015-05-14 DIAGNOSIS — Z992 Dependence on renal dialysis: Secondary | ICD-10-CM | POA: Diagnosis not present

## 2015-05-16 DIAGNOSIS — N186 End stage renal disease: Secondary | ICD-10-CM | POA: Diagnosis not present

## 2015-05-16 DIAGNOSIS — D509 Iron deficiency anemia, unspecified: Secondary | ICD-10-CM | POA: Diagnosis not present

## 2015-05-16 DIAGNOSIS — Z992 Dependence on renal dialysis: Secondary | ICD-10-CM | POA: Diagnosis not present

## 2015-05-16 DIAGNOSIS — D631 Anemia in chronic kidney disease: Secondary | ICD-10-CM | POA: Diagnosis not present

## 2015-05-16 DIAGNOSIS — E119 Type 2 diabetes mellitus without complications: Secondary | ICD-10-CM | POA: Diagnosis not present

## 2015-05-16 DIAGNOSIS — N2581 Secondary hyperparathyroidism of renal origin: Secondary | ICD-10-CM | POA: Diagnosis not present

## 2015-05-18 DIAGNOSIS — Z992 Dependence on renal dialysis: Secondary | ICD-10-CM | POA: Diagnosis not present

## 2015-05-18 DIAGNOSIS — N186 End stage renal disease: Secondary | ICD-10-CM | POA: Diagnosis not present

## 2015-05-18 DIAGNOSIS — E119 Type 2 diabetes mellitus without complications: Secondary | ICD-10-CM | POA: Diagnosis not present

## 2015-05-18 DIAGNOSIS — N2581 Secondary hyperparathyroidism of renal origin: Secondary | ICD-10-CM | POA: Diagnosis not present

## 2015-05-18 DIAGNOSIS — D631 Anemia in chronic kidney disease: Secondary | ICD-10-CM | POA: Diagnosis not present

## 2015-05-18 DIAGNOSIS — D509 Iron deficiency anemia, unspecified: Secondary | ICD-10-CM | POA: Diagnosis not present

## 2015-05-21 DIAGNOSIS — N2581 Secondary hyperparathyroidism of renal origin: Secondary | ICD-10-CM | POA: Diagnosis not present

## 2015-05-21 DIAGNOSIS — E119 Type 2 diabetes mellitus without complications: Secondary | ICD-10-CM | POA: Diagnosis not present

## 2015-05-21 DIAGNOSIS — N186 End stage renal disease: Secondary | ICD-10-CM | POA: Diagnosis not present

## 2015-05-21 DIAGNOSIS — D631 Anemia in chronic kidney disease: Secondary | ICD-10-CM | POA: Diagnosis not present

## 2015-05-21 DIAGNOSIS — Z992 Dependence on renal dialysis: Secondary | ICD-10-CM | POA: Diagnosis not present

## 2015-05-21 DIAGNOSIS — D509 Iron deficiency anemia, unspecified: Secondary | ICD-10-CM | POA: Diagnosis not present

## 2015-05-22 ENCOUNTER — Other Ambulatory Visit: Payer: Self-pay | Admitting: *Deleted

## 2015-05-22 MED ORDER — METOPROLOL SUCCINATE ER 50 MG PO TB24: 50.0000 mg | ORAL_TABLET | Freq: Every day | ORAL | Status: DC

## 2015-05-23 DIAGNOSIS — D509 Iron deficiency anemia, unspecified: Secondary | ICD-10-CM | POA: Diagnosis not present

## 2015-05-23 DIAGNOSIS — N2581 Secondary hyperparathyroidism of renal origin: Secondary | ICD-10-CM | POA: Diagnosis not present

## 2015-05-23 DIAGNOSIS — D631 Anemia in chronic kidney disease: Secondary | ICD-10-CM | POA: Diagnosis not present

## 2015-05-23 DIAGNOSIS — N186 End stage renal disease: Secondary | ICD-10-CM | POA: Diagnosis not present

## 2015-05-23 DIAGNOSIS — Z992 Dependence on renal dialysis: Secondary | ICD-10-CM | POA: Diagnosis not present

## 2015-05-23 DIAGNOSIS — E119 Type 2 diabetes mellitus without complications: Secondary | ICD-10-CM | POA: Diagnosis not present

## 2015-05-25 DIAGNOSIS — N186 End stage renal disease: Secondary | ICD-10-CM | POA: Diagnosis not present

## 2015-05-25 DIAGNOSIS — Z992 Dependence on renal dialysis: Secondary | ICD-10-CM | POA: Diagnosis not present

## 2015-05-25 DIAGNOSIS — D631 Anemia in chronic kidney disease: Secondary | ICD-10-CM | POA: Diagnosis not present

## 2015-05-25 DIAGNOSIS — D509 Iron deficiency anemia, unspecified: Secondary | ICD-10-CM | POA: Diagnosis not present

## 2015-05-25 DIAGNOSIS — N2581 Secondary hyperparathyroidism of renal origin: Secondary | ICD-10-CM | POA: Diagnosis not present

## 2015-05-25 DIAGNOSIS — E119 Type 2 diabetes mellitus without complications: Secondary | ICD-10-CM | POA: Diagnosis not present

## 2015-05-28 DIAGNOSIS — E119 Type 2 diabetes mellitus without complications: Secondary | ICD-10-CM | POA: Diagnosis not present

## 2015-05-28 DIAGNOSIS — D509 Iron deficiency anemia, unspecified: Secondary | ICD-10-CM | POA: Diagnosis not present

## 2015-05-28 DIAGNOSIS — N2581 Secondary hyperparathyroidism of renal origin: Secondary | ICD-10-CM | POA: Diagnosis not present

## 2015-05-28 DIAGNOSIS — Z992 Dependence on renal dialysis: Secondary | ICD-10-CM | POA: Diagnosis not present

## 2015-05-28 DIAGNOSIS — N186 End stage renal disease: Secondary | ICD-10-CM | POA: Diagnosis not present

## 2015-05-28 DIAGNOSIS — D631 Anemia in chronic kidney disease: Secondary | ICD-10-CM | POA: Diagnosis not present

## 2015-05-30 DIAGNOSIS — N186 End stage renal disease: Secondary | ICD-10-CM | POA: Diagnosis not present

## 2015-05-30 DIAGNOSIS — Z992 Dependence on renal dialysis: Secondary | ICD-10-CM | POA: Diagnosis not present

## 2015-05-30 DIAGNOSIS — D509 Iron deficiency anemia, unspecified: Secondary | ICD-10-CM | POA: Diagnosis not present

## 2015-05-30 DIAGNOSIS — E119 Type 2 diabetes mellitus without complications: Secondary | ICD-10-CM | POA: Diagnosis not present

## 2015-05-30 DIAGNOSIS — D631 Anemia in chronic kidney disease: Secondary | ICD-10-CM | POA: Diagnosis not present

## 2015-05-30 DIAGNOSIS — N2581 Secondary hyperparathyroidism of renal origin: Secondary | ICD-10-CM | POA: Diagnosis not present

## 2015-06-01 DIAGNOSIS — N2581 Secondary hyperparathyroidism of renal origin: Secondary | ICD-10-CM | POA: Diagnosis not present

## 2015-06-01 DIAGNOSIS — E119 Type 2 diabetes mellitus without complications: Secondary | ICD-10-CM | POA: Diagnosis not present

## 2015-06-01 DIAGNOSIS — N186 End stage renal disease: Secondary | ICD-10-CM | POA: Diagnosis not present

## 2015-06-01 DIAGNOSIS — Z992 Dependence on renal dialysis: Secondary | ICD-10-CM | POA: Diagnosis not present

## 2015-06-01 DIAGNOSIS — D631 Anemia in chronic kidney disease: Secondary | ICD-10-CM | POA: Diagnosis not present

## 2015-06-01 DIAGNOSIS — D509 Iron deficiency anemia, unspecified: Secondary | ICD-10-CM | POA: Diagnosis not present

## 2015-06-03 DIAGNOSIS — Z992 Dependence on renal dialysis: Secondary | ICD-10-CM | POA: Diagnosis not present

## 2015-06-03 DIAGNOSIS — N186 End stage renal disease: Secondary | ICD-10-CM | POA: Diagnosis not present

## 2015-06-03 DIAGNOSIS — I12 Hypertensive chronic kidney disease with 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: Secondary | ICD-10-CM | POA: Diagnosis not present

## 2015-06-04 DIAGNOSIS — N186 End stage renal disease: Secondary | ICD-10-CM | POA: Diagnosis not present

## 2015-06-04 DIAGNOSIS — D509 Iron deficiency anemia, unspecified: Secondary | ICD-10-CM | POA: Diagnosis not present

## 2015-06-04 DIAGNOSIS — Z992 Dependence on renal dialysis: Secondary | ICD-10-CM | POA: Diagnosis not present

## 2015-06-04 DIAGNOSIS — D631 Anemia in chronic kidney disease: Secondary | ICD-10-CM | POA: Diagnosis not present

## 2015-06-04 DIAGNOSIS — N2581 Secondary hyperparathyroidism of renal origin: Secondary | ICD-10-CM | POA: Diagnosis not present

## 2015-06-06 DIAGNOSIS — D509 Iron deficiency anemia, unspecified: Secondary | ICD-10-CM | POA: Diagnosis not present

## 2015-06-06 DIAGNOSIS — D631 Anemia in chronic kidney disease: Secondary | ICD-10-CM | POA: Diagnosis not present

## 2015-06-06 DIAGNOSIS — N186 End stage renal disease: Secondary | ICD-10-CM | POA: Diagnosis not present

## 2015-06-06 DIAGNOSIS — N2581 Secondary hyperparathyroidism of renal origin: Secondary | ICD-10-CM | POA: Diagnosis not present

## 2015-06-06 DIAGNOSIS — Z992 Dependence on renal dialysis: Secondary | ICD-10-CM | POA: Diagnosis not present

## 2015-06-08 DIAGNOSIS — N2581 Secondary hyperparathyroidism of renal origin: Secondary | ICD-10-CM | POA: Diagnosis not present

## 2015-06-08 DIAGNOSIS — Z992 Dependence on renal dialysis: Secondary | ICD-10-CM | POA: Diagnosis not present

## 2015-06-08 DIAGNOSIS — D509 Iron deficiency anemia, unspecified: Secondary | ICD-10-CM | POA: Diagnosis not present

## 2015-06-08 DIAGNOSIS — N186 End stage renal disease: Secondary | ICD-10-CM | POA: Diagnosis not present

## 2015-06-08 DIAGNOSIS — D631 Anemia in chronic kidney disease: Secondary | ICD-10-CM | POA: Diagnosis not present

## 2015-06-11 DIAGNOSIS — N186 End stage renal disease: Secondary | ICD-10-CM | POA: Diagnosis not present

## 2015-06-11 DIAGNOSIS — Z992 Dependence on renal dialysis: Secondary | ICD-10-CM | POA: Diagnosis not present

## 2015-06-11 DIAGNOSIS — N2581 Secondary hyperparathyroidism of renal origin: Secondary | ICD-10-CM | POA: Diagnosis not present

## 2015-06-11 DIAGNOSIS — D631 Anemia in chronic kidney disease: Secondary | ICD-10-CM | POA: Diagnosis not present

## 2015-06-11 DIAGNOSIS — D509 Iron deficiency anemia, unspecified: Secondary | ICD-10-CM | POA: Diagnosis not present

## 2015-06-13 DIAGNOSIS — D509 Iron deficiency anemia, unspecified: Secondary | ICD-10-CM | POA: Diagnosis not present

## 2015-06-13 DIAGNOSIS — N186 End stage renal disease: Secondary | ICD-10-CM | POA: Diagnosis not present

## 2015-06-13 DIAGNOSIS — D631 Anemia in chronic kidney disease: Secondary | ICD-10-CM | POA: Diagnosis not present

## 2015-06-13 DIAGNOSIS — N2581 Secondary hyperparathyroidism of renal origin: Secondary | ICD-10-CM | POA: Diagnosis not present

## 2015-06-13 DIAGNOSIS — Z992 Dependence on renal dialysis: Secondary | ICD-10-CM | POA: Diagnosis not present

## 2015-06-15 DIAGNOSIS — D631 Anemia in chronic kidney disease: Secondary | ICD-10-CM | POA: Diagnosis not present

## 2015-06-15 DIAGNOSIS — Z992 Dependence on renal dialysis: Secondary | ICD-10-CM | POA: Diagnosis not present

## 2015-06-15 DIAGNOSIS — N2581 Secondary hyperparathyroidism of renal origin: Secondary | ICD-10-CM | POA: Diagnosis not present

## 2015-06-15 DIAGNOSIS — D509 Iron deficiency anemia, unspecified: Secondary | ICD-10-CM | POA: Diagnosis not present

## 2015-06-15 DIAGNOSIS — N186 End stage renal disease: Secondary | ICD-10-CM | POA: Diagnosis not present

## 2015-06-18 DIAGNOSIS — N186 End stage renal disease: Secondary | ICD-10-CM | POA: Diagnosis not present

## 2015-06-18 DIAGNOSIS — Z992 Dependence on renal dialysis: Secondary | ICD-10-CM | POA: Diagnosis not present

## 2015-06-18 DIAGNOSIS — D631 Anemia in chronic kidney disease: Secondary | ICD-10-CM | POA: Diagnosis not present

## 2015-06-18 DIAGNOSIS — N2581 Secondary hyperparathyroidism of renal origin: Secondary | ICD-10-CM | POA: Diagnosis not present

## 2015-06-18 DIAGNOSIS — D509 Iron deficiency anemia, unspecified: Secondary | ICD-10-CM | POA: Diagnosis not present

## 2015-06-20 DIAGNOSIS — N2581 Secondary hyperparathyroidism of renal origin: Secondary | ICD-10-CM | POA: Diagnosis not present

## 2015-06-20 DIAGNOSIS — N186 End stage renal disease: Secondary | ICD-10-CM | POA: Diagnosis not present

## 2015-06-20 DIAGNOSIS — D509 Iron deficiency anemia, unspecified: Secondary | ICD-10-CM | POA: Diagnosis not present

## 2015-06-20 DIAGNOSIS — D631 Anemia in chronic kidney disease: Secondary | ICD-10-CM | POA: Diagnosis not present

## 2015-06-20 DIAGNOSIS — Z992 Dependence on renal dialysis: Secondary | ICD-10-CM | POA: Diagnosis not present

## 2015-06-22 DIAGNOSIS — Z992 Dependence on renal dialysis: Secondary | ICD-10-CM | POA: Diagnosis not present

## 2015-06-22 DIAGNOSIS — N186 End stage renal disease: Secondary | ICD-10-CM | POA: Diagnosis not present

## 2015-06-22 DIAGNOSIS — D631 Anemia in chronic kidney disease: Secondary | ICD-10-CM | POA: Diagnosis not present

## 2015-06-22 DIAGNOSIS — D509 Iron deficiency anemia, unspecified: Secondary | ICD-10-CM | POA: Diagnosis not present

## 2015-06-22 DIAGNOSIS — N2581 Secondary hyperparathyroidism of renal origin: Secondary | ICD-10-CM | POA: Diagnosis not present

## 2015-06-25 DIAGNOSIS — N186 End stage renal disease: Secondary | ICD-10-CM | POA: Diagnosis not present

## 2015-06-25 DIAGNOSIS — D631 Anemia in chronic kidney disease: Secondary | ICD-10-CM | POA: Diagnosis not present

## 2015-06-25 DIAGNOSIS — N2581 Secondary hyperparathyroidism of renal origin: Secondary | ICD-10-CM | POA: Diagnosis not present

## 2015-06-25 DIAGNOSIS — Z992 Dependence on renal dialysis: Secondary | ICD-10-CM | POA: Diagnosis not present

## 2015-06-25 DIAGNOSIS — D509 Iron deficiency anemia, unspecified: Secondary | ICD-10-CM | POA: Diagnosis not present

## 2015-06-27 DIAGNOSIS — Z992 Dependence on renal dialysis: Secondary | ICD-10-CM | POA: Diagnosis not present

## 2015-06-27 DIAGNOSIS — N2581 Secondary hyperparathyroidism of renal origin: Secondary | ICD-10-CM | POA: Diagnosis not present

## 2015-06-27 DIAGNOSIS — D631 Anemia in chronic kidney disease: Secondary | ICD-10-CM | POA: Diagnosis not present

## 2015-06-27 DIAGNOSIS — N186 End stage renal disease: Secondary | ICD-10-CM | POA: Diagnosis not present

## 2015-06-27 DIAGNOSIS — D509 Iron deficiency anemia, unspecified: Secondary | ICD-10-CM | POA: Diagnosis not present

## 2015-06-29 DIAGNOSIS — N186 End stage renal disease: Secondary | ICD-10-CM | POA: Diagnosis not present

## 2015-06-29 DIAGNOSIS — D631 Anemia in chronic kidney disease: Secondary | ICD-10-CM | POA: Diagnosis not present

## 2015-06-29 DIAGNOSIS — N2581 Secondary hyperparathyroidism of renal origin: Secondary | ICD-10-CM | POA: Diagnosis not present

## 2015-06-29 DIAGNOSIS — Z992 Dependence on renal dialysis: Secondary | ICD-10-CM | POA: Diagnosis not present

## 2015-06-29 DIAGNOSIS — D509 Iron deficiency anemia, unspecified: Secondary | ICD-10-CM | POA: Diagnosis not present

## 2015-07-02 DIAGNOSIS — Z992 Dependence on renal dialysis: Secondary | ICD-10-CM | POA: Diagnosis not present

## 2015-07-02 DIAGNOSIS — N186 End stage renal disease: Secondary | ICD-10-CM | POA: Diagnosis not present

## 2015-07-02 DIAGNOSIS — D509 Iron deficiency anemia, unspecified: Secondary | ICD-10-CM | POA: Diagnosis not present

## 2015-07-02 DIAGNOSIS — N2581 Secondary hyperparathyroidism of renal origin: Secondary | ICD-10-CM | POA: Diagnosis not present

## 2015-07-02 DIAGNOSIS — D631 Anemia in chronic kidney disease: Secondary | ICD-10-CM | POA: Diagnosis not present

## 2015-07-04 DIAGNOSIS — N186 End stage renal disease: Secondary | ICD-10-CM | POA: Diagnosis not present

## 2015-07-04 DIAGNOSIS — Z992 Dependence on renal dialysis: Secondary | ICD-10-CM | POA: Diagnosis not present

## 2015-07-04 DIAGNOSIS — D509 Iron deficiency anemia, unspecified: Secondary | ICD-10-CM | POA: Diagnosis not present

## 2015-07-04 DIAGNOSIS — I12 Hypertensive chronic kidney disease with 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: Secondary | ICD-10-CM | POA: Diagnosis not present

## 2015-07-04 DIAGNOSIS — D631 Anemia in chronic kidney disease: Secondary | ICD-10-CM | POA: Diagnosis not present

## 2015-07-04 DIAGNOSIS — N2581 Secondary hyperparathyroidism of renal origin: Secondary | ICD-10-CM | POA: Diagnosis not present

## 2015-07-06 DIAGNOSIS — N2581 Secondary hyperparathyroidism of renal origin: Secondary | ICD-10-CM | POA: Diagnosis not present

## 2015-07-06 DIAGNOSIS — D509 Iron deficiency anemia, unspecified: Secondary | ICD-10-CM | POA: Diagnosis not present

## 2015-07-06 DIAGNOSIS — Z992 Dependence on renal dialysis: Secondary | ICD-10-CM | POA: Diagnosis not present

## 2015-07-06 DIAGNOSIS — E119 Type 2 diabetes mellitus without complications: Secondary | ICD-10-CM | POA: Diagnosis not present

## 2015-07-06 DIAGNOSIS — D631 Anemia in chronic kidney disease: Secondary | ICD-10-CM | POA: Diagnosis not present

## 2015-07-06 DIAGNOSIS — N186 End stage renal disease: Secondary | ICD-10-CM | POA: Diagnosis not present

## 2015-07-09 DIAGNOSIS — N2581 Secondary hyperparathyroidism of renal origin: Secondary | ICD-10-CM | POA: Diagnosis not present

## 2015-07-09 DIAGNOSIS — N186 End stage renal disease: Secondary | ICD-10-CM | POA: Diagnosis not present

## 2015-07-09 DIAGNOSIS — Z992 Dependence on renal dialysis: Secondary | ICD-10-CM | POA: Diagnosis not present

## 2015-07-09 DIAGNOSIS — E119 Type 2 diabetes mellitus without complications: Secondary | ICD-10-CM | POA: Diagnosis not present

## 2015-07-09 DIAGNOSIS — D631 Anemia in chronic kidney disease: Secondary | ICD-10-CM | POA: Diagnosis not present

## 2015-07-09 DIAGNOSIS — D509 Iron deficiency anemia, unspecified: Secondary | ICD-10-CM | POA: Diagnosis not present

## 2015-07-11 DIAGNOSIS — D509 Iron deficiency anemia, unspecified: Secondary | ICD-10-CM | POA: Diagnosis not present

## 2015-07-11 DIAGNOSIS — D631 Anemia in chronic kidney disease: Secondary | ICD-10-CM | POA: Diagnosis not present

## 2015-07-11 DIAGNOSIS — N186 End stage renal disease: Secondary | ICD-10-CM | POA: Diagnosis not present

## 2015-07-11 DIAGNOSIS — Z992 Dependence on renal dialysis: Secondary | ICD-10-CM | POA: Diagnosis not present

## 2015-07-11 DIAGNOSIS — E119 Type 2 diabetes mellitus without complications: Secondary | ICD-10-CM | POA: Diagnosis not present

## 2015-07-11 DIAGNOSIS — N2581 Secondary hyperparathyroidism of renal origin: Secondary | ICD-10-CM | POA: Diagnosis not present

## 2015-07-13 DIAGNOSIS — D509 Iron deficiency anemia, unspecified: Secondary | ICD-10-CM | POA: Diagnosis not present

## 2015-07-13 DIAGNOSIS — N2581 Secondary hyperparathyroidism of renal origin: Secondary | ICD-10-CM | POA: Diagnosis not present

## 2015-07-13 DIAGNOSIS — E119 Type 2 diabetes mellitus without complications: Secondary | ICD-10-CM | POA: Diagnosis not present

## 2015-07-13 DIAGNOSIS — Z992 Dependence on renal dialysis: Secondary | ICD-10-CM | POA: Diagnosis not present

## 2015-07-13 DIAGNOSIS — N186 End stage renal disease: Secondary | ICD-10-CM | POA: Diagnosis not present

## 2015-07-13 DIAGNOSIS — D631 Anemia in chronic kidney disease: Secondary | ICD-10-CM | POA: Diagnosis not present

## 2015-07-16 DIAGNOSIS — Z992 Dependence on renal dialysis: Secondary | ICD-10-CM | POA: Diagnosis not present

## 2015-07-16 DIAGNOSIS — N186 End stage renal disease: Secondary | ICD-10-CM | POA: Diagnosis not present

## 2015-07-16 DIAGNOSIS — D631 Anemia in chronic kidney disease: Secondary | ICD-10-CM | POA: Diagnosis not present

## 2015-07-16 DIAGNOSIS — N2581 Secondary hyperparathyroidism of renal origin: Secondary | ICD-10-CM | POA: Diagnosis not present

## 2015-07-16 DIAGNOSIS — E119 Type 2 diabetes mellitus without complications: Secondary | ICD-10-CM | POA: Diagnosis not present

## 2015-07-16 DIAGNOSIS — D509 Iron deficiency anemia, unspecified: Secondary | ICD-10-CM | POA: Diagnosis not present

## 2015-07-18 DIAGNOSIS — D631 Anemia in chronic kidney disease: Secondary | ICD-10-CM | POA: Diagnosis not present

## 2015-07-18 DIAGNOSIS — N186 End stage renal disease: Secondary | ICD-10-CM | POA: Diagnosis not present

## 2015-07-18 DIAGNOSIS — Z992 Dependence on renal dialysis: Secondary | ICD-10-CM | POA: Diagnosis not present

## 2015-07-18 DIAGNOSIS — N2581 Secondary hyperparathyroidism of renal origin: Secondary | ICD-10-CM | POA: Diagnosis not present

## 2015-07-18 DIAGNOSIS — E119 Type 2 diabetes mellitus without complications: Secondary | ICD-10-CM | POA: Diagnosis not present

## 2015-07-18 DIAGNOSIS — D509 Iron deficiency anemia, unspecified: Secondary | ICD-10-CM | POA: Diagnosis not present

## 2015-07-20 DIAGNOSIS — N186 End stage renal disease: Secondary | ICD-10-CM | POA: Diagnosis not present

## 2015-07-20 DIAGNOSIS — E119 Type 2 diabetes mellitus without complications: Secondary | ICD-10-CM | POA: Diagnosis not present

## 2015-07-20 DIAGNOSIS — N2581 Secondary hyperparathyroidism of renal origin: Secondary | ICD-10-CM | POA: Diagnosis not present

## 2015-07-20 DIAGNOSIS — D631 Anemia in chronic kidney disease: Secondary | ICD-10-CM | POA: Diagnosis not present

## 2015-07-20 DIAGNOSIS — D509 Iron deficiency anemia, unspecified: Secondary | ICD-10-CM | POA: Diagnosis not present

## 2015-07-20 DIAGNOSIS — L299 Pruritus, unspecified: Secondary | ICD-10-CM | POA: Insufficient documentation

## 2015-07-20 DIAGNOSIS — Z992 Dependence on renal dialysis: Secondary | ICD-10-CM | POA: Diagnosis not present

## 2015-07-23 DIAGNOSIS — E119 Type 2 diabetes mellitus without complications: Secondary | ICD-10-CM | POA: Diagnosis not present

## 2015-07-23 DIAGNOSIS — Z992 Dependence on renal dialysis: Secondary | ICD-10-CM | POA: Diagnosis not present

## 2015-07-23 DIAGNOSIS — D631 Anemia in chronic kidney disease: Secondary | ICD-10-CM | POA: Diagnosis not present

## 2015-07-23 DIAGNOSIS — N186 End stage renal disease: Secondary | ICD-10-CM | POA: Diagnosis not present

## 2015-07-23 DIAGNOSIS — N2581 Secondary hyperparathyroidism of renal origin: Secondary | ICD-10-CM | POA: Diagnosis not present

## 2015-07-23 DIAGNOSIS — D509 Iron deficiency anemia, unspecified: Secondary | ICD-10-CM | POA: Diagnosis not present

## 2015-07-25 DIAGNOSIS — N186 End stage renal disease: Secondary | ICD-10-CM | POA: Diagnosis not present

## 2015-07-25 DIAGNOSIS — E119 Type 2 diabetes mellitus without complications: Secondary | ICD-10-CM | POA: Diagnosis not present

## 2015-07-25 DIAGNOSIS — D631 Anemia in chronic kidney disease: Secondary | ICD-10-CM | POA: Diagnosis not present

## 2015-07-25 DIAGNOSIS — D509 Iron deficiency anemia, unspecified: Secondary | ICD-10-CM | POA: Diagnosis not present

## 2015-07-25 DIAGNOSIS — N2581 Secondary hyperparathyroidism of renal origin: Secondary | ICD-10-CM | POA: Diagnosis not present

## 2015-07-25 DIAGNOSIS — Z992 Dependence on renal dialysis: Secondary | ICD-10-CM | POA: Diagnosis not present

## 2015-07-27 DIAGNOSIS — N186 End stage renal disease: Secondary | ICD-10-CM | POA: Diagnosis not present

## 2015-07-27 DIAGNOSIS — D631 Anemia in chronic kidney disease: Secondary | ICD-10-CM | POA: Diagnosis not present

## 2015-07-27 DIAGNOSIS — D509 Iron deficiency anemia, unspecified: Secondary | ICD-10-CM | POA: Diagnosis not present

## 2015-07-27 DIAGNOSIS — E119 Type 2 diabetes mellitus without complications: Secondary | ICD-10-CM | POA: Diagnosis not present

## 2015-07-27 DIAGNOSIS — N2581 Secondary hyperparathyroidism of renal origin: Secondary | ICD-10-CM | POA: Diagnosis not present

## 2015-07-27 DIAGNOSIS — Z992 Dependence on renal dialysis: Secondary | ICD-10-CM | POA: Diagnosis not present

## 2015-07-30 DIAGNOSIS — N2581 Secondary hyperparathyroidism of renal origin: Secondary | ICD-10-CM | POA: Diagnosis not present

## 2015-07-30 DIAGNOSIS — E119 Type 2 diabetes mellitus without complications: Secondary | ICD-10-CM | POA: Diagnosis not present

## 2015-07-30 DIAGNOSIS — Z992 Dependence on renal dialysis: Secondary | ICD-10-CM | POA: Diagnosis not present

## 2015-07-30 DIAGNOSIS — N186 End stage renal disease: Secondary | ICD-10-CM | POA: Diagnosis not present

## 2015-07-30 DIAGNOSIS — D631 Anemia in chronic kidney disease: Secondary | ICD-10-CM | POA: Diagnosis not present

## 2015-07-30 DIAGNOSIS — D509 Iron deficiency anemia, unspecified: Secondary | ICD-10-CM | POA: Diagnosis not present

## 2015-08-01 DIAGNOSIS — N186 End stage renal disease: Secondary | ICD-10-CM | POA: Diagnosis not present

## 2015-08-01 DIAGNOSIS — N2581 Secondary hyperparathyroidism of renal origin: Secondary | ICD-10-CM | POA: Diagnosis not present

## 2015-08-01 DIAGNOSIS — E119 Type 2 diabetes mellitus without complications: Secondary | ICD-10-CM | POA: Diagnosis not present

## 2015-08-01 DIAGNOSIS — Z992 Dependence on renal dialysis: Secondary | ICD-10-CM | POA: Diagnosis not present

## 2015-08-01 DIAGNOSIS — D631 Anemia in chronic kidney disease: Secondary | ICD-10-CM | POA: Diagnosis not present

## 2015-08-01 DIAGNOSIS — D509 Iron deficiency anemia, unspecified: Secondary | ICD-10-CM | POA: Diagnosis not present

## 2015-08-03 DIAGNOSIS — E119 Type 2 diabetes mellitus without complications: Secondary | ICD-10-CM | POA: Diagnosis not present

## 2015-08-03 DIAGNOSIS — Z992 Dependence on renal dialysis: Secondary | ICD-10-CM | POA: Diagnosis not present

## 2015-08-03 DIAGNOSIS — N186 End stage renal disease: Secondary | ICD-10-CM | POA: Diagnosis not present

## 2015-08-03 DIAGNOSIS — D509 Iron deficiency anemia, unspecified: Secondary | ICD-10-CM | POA: Diagnosis not present

## 2015-08-03 DIAGNOSIS — D631 Anemia in chronic kidney disease: Secondary | ICD-10-CM | POA: Diagnosis not present

## 2015-08-03 DIAGNOSIS — N2581 Secondary hyperparathyroidism of renal origin: Secondary | ICD-10-CM | POA: Diagnosis not present

## 2015-08-03 DIAGNOSIS — I12 Hypertensive chronic kidney disease with 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: Secondary | ICD-10-CM | POA: Diagnosis not present

## 2015-08-06 DIAGNOSIS — D509 Iron deficiency anemia, unspecified: Secondary | ICD-10-CM | POA: Diagnosis not present

## 2015-08-06 DIAGNOSIS — N2581 Secondary hyperparathyroidism of renal origin: Secondary | ICD-10-CM | POA: Diagnosis not present

## 2015-08-06 DIAGNOSIS — D631 Anemia in chronic kidney disease: Secondary | ICD-10-CM | POA: Diagnosis not present

## 2015-08-06 DIAGNOSIS — Z992 Dependence on renal dialysis: Secondary | ICD-10-CM | POA: Diagnosis not present

## 2015-08-06 DIAGNOSIS — E119 Type 2 diabetes mellitus without complications: Secondary | ICD-10-CM | POA: Diagnosis not present

## 2015-08-06 DIAGNOSIS — N186 End stage renal disease: Secondary | ICD-10-CM | POA: Diagnosis not present

## 2015-08-08 DIAGNOSIS — Z992 Dependence on renal dialysis: Secondary | ICD-10-CM | POA: Diagnosis not present

## 2015-08-08 DIAGNOSIS — D509 Iron deficiency anemia, unspecified: Secondary | ICD-10-CM | POA: Diagnosis not present

## 2015-08-08 DIAGNOSIS — D631 Anemia in chronic kidney disease: Secondary | ICD-10-CM | POA: Diagnosis not present

## 2015-08-08 DIAGNOSIS — E119 Type 2 diabetes mellitus without complications: Secondary | ICD-10-CM | POA: Diagnosis not present

## 2015-08-08 DIAGNOSIS — N2581 Secondary hyperparathyroidism of renal origin: Secondary | ICD-10-CM | POA: Diagnosis not present

## 2015-08-08 DIAGNOSIS — N186 End stage renal disease: Secondary | ICD-10-CM | POA: Diagnosis not present

## 2015-08-10 DIAGNOSIS — N186 End stage renal disease: Secondary | ICD-10-CM | POA: Diagnosis not present

## 2015-08-10 DIAGNOSIS — D509 Iron deficiency anemia, unspecified: Secondary | ICD-10-CM | POA: Diagnosis not present

## 2015-08-10 DIAGNOSIS — D631 Anemia in chronic kidney disease: Secondary | ICD-10-CM | POA: Diagnosis not present

## 2015-08-10 DIAGNOSIS — N2581 Secondary hyperparathyroidism of renal origin: Secondary | ICD-10-CM | POA: Diagnosis not present

## 2015-08-10 DIAGNOSIS — Z992 Dependence on renal dialysis: Secondary | ICD-10-CM | POA: Diagnosis not present

## 2015-08-10 DIAGNOSIS — E119 Type 2 diabetes mellitus without complications: Secondary | ICD-10-CM | POA: Diagnosis not present

## 2015-08-13 DIAGNOSIS — D631 Anemia in chronic kidney disease: Secondary | ICD-10-CM | POA: Diagnosis not present

## 2015-08-13 DIAGNOSIS — Z992 Dependence on renal dialysis: Secondary | ICD-10-CM | POA: Diagnosis not present

## 2015-08-13 DIAGNOSIS — D509 Iron deficiency anemia, unspecified: Secondary | ICD-10-CM | POA: Diagnosis not present

## 2015-08-13 DIAGNOSIS — N186 End stage renal disease: Secondary | ICD-10-CM | POA: Diagnosis not present

## 2015-08-13 DIAGNOSIS — N2581 Secondary hyperparathyroidism of renal origin: Secondary | ICD-10-CM | POA: Diagnosis not present

## 2015-08-13 DIAGNOSIS — E119 Type 2 diabetes mellitus without complications: Secondary | ICD-10-CM | POA: Diagnosis not present

## 2015-08-15 DIAGNOSIS — E119 Type 2 diabetes mellitus without complications: Secondary | ICD-10-CM | POA: Diagnosis not present

## 2015-08-15 DIAGNOSIS — N186 End stage renal disease: Secondary | ICD-10-CM | POA: Diagnosis not present

## 2015-08-15 DIAGNOSIS — N2581 Secondary hyperparathyroidism of renal origin: Secondary | ICD-10-CM | POA: Diagnosis not present

## 2015-08-15 DIAGNOSIS — D631 Anemia in chronic kidney disease: Secondary | ICD-10-CM | POA: Diagnosis not present

## 2015-08-15 DIAGNOSIS — D509 Iron deficiency anemia, unspecified: Secondary | ICD-10-CM | POA: Diagnosis not present

## 2015-08-15 DIAGNOSIS — Z992 Dependence on renal dialysis: Secondary | ICD-10-CM | POA: Diagnosis not present

## 2015-08-17 DIAGNOSIS — D509 Iron deficiency anemia, unspecified: Secondary | ICD-10-CM | POA: Diagnosis not present

## 2015-08-17 DIAGNOSIS — Z992 Dependence on renal dialysis: Secondary | ICD-10-CM | POA: Diagnosis not present

## 2015-08-17 DIAGNOSIS — N2581 Secondary hyperparathyroidism of renal origin: Secondary | ICD-10-CM | POA: Diagnosis not present

## 2015-08-17 DIAGNOSIS — N186 End stage renal disease: Secondary | ICD-10-CM | POA: Diagnosis not present

## 2015-08-17 DIAGNOSIS — E119 Type 2 diabetes mellitus without complications: Secondary | ICD-10-CM | POA: Diagnosis not present

## 2015-08-17 DIAGNOSIS — D631 Anemia in chronic kidney disease: Secondary | ICD-10-CM | POA: Diagnosis not present

## 2015-08-20 DIAGNOSIS — D509 Iron deficiency anemia, unspecified: Secondary | ICD-10-CM | POA: Diagnosis not present

## 2015-08-20 DIAGNOSIS — Z992 Dependence on renal dialysis: Secondary | ICD-10-CM | POA: Diagnosis not present

## 2015-08-20 DIAGNOSIS — N2581 Secondary hyperparathyroidism of renal origin: Secondary | ICD-10-CM | POA: Diagnosis not present

## 2015-08-20 DIAGNOSIS — D631 Anemia in chronic kidney disease: Secondary | ICD-10-CM | POA: Diagnosis not present

## 2015-08-20 DIAGNOSIS — E119 Type 2 diabetes mellitus without complications: Secondary | ICD-10-CM | POA: Diagnosis not present

## 2015-08-20 DIAGNOSIS — N186 End stage renal disease: Secondary | ICD-10-CM | POA: Diagnosis not present

## 2015-08-22 DIAGNOSIS — N186 End stage renal disease: Secondary | ICD-10-CM | POA: Diagnosis not present

## 2015-08-22 DIAGNOSIS — E119 Type 2 diabetes mellitus without complications: Secondary | ICD-10-CM | POA: Diagnosis not present

## 2015-08-22 DIAGNOSIS — D631 Anemia in chronic kidney disease: Secondary | ICD-10-CM | POA: Diagnosis not present

## 2015-08-22 DIAGNOSIS — D509 Iron deficiency anemia, unspecified: Secondary | ICD-10-CM | POA: Diagnosis not present

## 2015-08-22 DIAGNOSIS — Z992 Dependence on renal dialysis: Secondary | ICD-10-CM | POA: Diagnosis not present

## 2015-08-22 DIAGNOSIS — N2581 Secondary hyperparathyroidism of renal origin: Secondary | ICD-10-CM | POA: Diagnosis not present

## 2015-08-24 DIAGNOSIS — N2581 Secondary hyperparathyroidism of renal origin: Secondary | ICD-10-CM | POA: Diagnosis not present

## 2015-08-24 DIAGNOSIS — D631 Anemia in chronic kidney disease: Secondary | ICD-10-CM | POA: Diagnosis not present

## 2015-08-24 DIAGNOSIS — D509 Iron deficiency anemia, unspecified: Secondary | ICD-10-CM | POA: Diagnosis not present

## 2015-08-24 DIAGNOSIS — Z992 Dependence on renal dialysis: Secondary | ICD-10-CM | POA: Diagnosis not present

## 2015-08-24 DIAGNOSIS — E119 Type 2 diabetes mellitus without complications: Secondary | ICD-10-CM | POA: Diagnosis not present

## 2015-08-24 DIAGNOSIS — N186 End stage renal disease: Secondary | ICD-10-CM | POA: Diagnosis not present

## 2015-08-27 DIAGNOSIS — E119 Type 2 diabetes mellitus without complications: Secondary | ICD-10-CM | POA: Diagnosis not present

## 2015-08-27 DIAGNOSIS — N186 End stage renal disease: Secondary | ICD-10-CM | POA: Diagnosis not present

## 2015-08-27 DIAGNOSIS — D509 Iron deficiency anemia, unspecified: Secondary | ICD-10-CM | POA: Diagnosis not present

## 2015-08-27 DIAGNOSIS — N2581 Secondary hyperparathyroidism of renal origin: Secondary | ICD-10-CM | POA: Diagnosis not present

## 2015-08-27 DIAGNOSIS — Z992 Dependence on renal dialysis: Secondary | ICD-10-CM | POA: Diagnosis not present

## 2015-08-27 DIAGNOSIS — D631 Anemia in chronic kidney disease: Secondary | ICD-10-CM | POA: Diagnosis not present

## 2015-08-29 DIAGNOSIS — Z992 Dependence on renal dialysis: Secondary | ICD-10-CM | POA: Diagnosis not present

## 2015-08-29 DIAGNOSIS — N186 End stage renal disease: Secondary | ICD-10-CM | POA: Diagnosis not present

## 2015-08-29 DIAGNOSIS — D509 Iron deficiency anemia, unspecified: Secondary | ICD-10-CM | POA: Diagnosis not present

## 2015-08-29 DIAGNOSIS — E119 Type 2 diabetes mellitus without complications: Secondary | ICD-10-CM | POA: Diagnosis not present

## 2015-08-29 DIAGNOSIS — D631 Anemia in chronic kidney disease: Secondary | ICD-10-CM | POA: Diagnosis not present

## 2015-08-29 DIAGNOSIS — N2581 Secondary hyperparathyroidism of renal origin: Secondary | ICD-10-CM | POA: Diagnosis not present

## 2015-08-31 DIAGNOSIS — Z992 Dependence on renal dialysis: Secondary | ICD-10-CM | POA: Diagnosis not present

## 2015-08-31 DIAGNOSIS — N186 End stage renal disease: Secondary | ICD-10-CM | POA: Diagnosis not present

## 2015-08-31 DIAGNOSIS — E119 Type 2 diabetes mellitus without complications: Secondary | ICD-10-CM | POA: Diagnosis not present

## 2015-08-31 DIAGNOSIS — D631 Anemia in chronic kidney disease: Secondary | ICD-10-CM | POA: Diagnosis not present

## 2015-08-31 DIAGNOSIS — D509 Iron deficiency anemia, unspecified: Secondary | ICD-10-CM | POA: Diagnosis not present

## 2015-08-31 DIAGNOSIS — N2581 Secondary hyperparathyroidism of renal origin: Secondary | ICD-10-CM | POA: Diagnosis not present

## 2015-09-03 DIAGNOSIS — N186 End stage renal disease: Secondary | ICD-10-CM | POA: Diagnosis not present

## 2015-09-03 DIAGNOSIS — D509 Iron deficiency anemia, unspecified: Secondary | ICD-10-CM | POA: Diagnosis not present

## 2015-09-03 DIAGNOSIS — E119 Type 2 diabetes mellitus without complications: Secondary | ICD-10-CM | POA: Diagnosis not present

## 2015-09-03 DIAGNOSIS — I12 Hypertensive chronic kidney disease with 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: Secondary | ICD-10-CM | POA: Diagnosis not present

## 2015-09-03 DIAGNOSIS — N2581 Secondary hyperparathyroidism of renal origin: Secondary | ICD-10-CM | POA: Diagnosis not present

## 2015-09-03 DIAGNOSIS — D631 Anemia in chronic kidney disease: Secondary | ICD-10-CM | POA: Diagnosis not present

## 2015-09-03 DIAGNOSIS — Z992 Dependence on renal dialysis: Secondary | ICD-10-CM | POA: Diagnosis not present

## 2015-09-05 DIAGNOSIS — E119 Type 2 diabetes mellitus without complications: Secondary | ICD-10-CM | POA: Diagnosis not present

## 2015-09-05 DIAGNOSIS — N2581 Secondary hyperparathyroidism of renal origin: Secondary | ICD-10-CM | POA: Diagnosis not present

## 2015-09-05 DIAGNOSIS — D631 Anemia in chronic kidney disease: Secondary | ICD-10-CM | POA: Diagnosis not present

## 2015-09-05 DIAGNOSIS — N186 End stage renal disease: Secondary | ICD-10-CM | POA: Diagnosis not present

## 2015-09-05 DIAGNOSIS — Z992 Dependence on renal dialysis: Secondary | ICD-10-CM | POA: Diagnosis not present

## 2015-09-05 DIAGNOSIS — D509 Iron deficiency anemia, unspecified: Secondary | ICD-10-CM | POA: Diagnosis not present

## 2015-09-07 DIAGNOSIS — E119 Type 2 diabetes mellitus without complications: Secondary | ICD-10-CM | POA: Diagnosis not present

## 2015-09-07 DIAGNOSIS — Z992 Dependence on renal dialysis: Secondary | ICD-10-CM | POA: Diagnosis not present

## 2015-09-07 DIAGNOSIS — N2581 Secondary hyperparathyroidism of renal origin: Secondary | ICD-10-CM | POA: Diagnosis not present

## 2015-09-07 DIAGNOSIS — D509 Iron deficiency anemia, unspecified: Secondary | ICD-10-CM | POA: Diagnosis not present

## 2015-09-07 DIAGNOSIS — N186 End stage renal disease: Secondary | ICD-10-CM | POA: Diagnosis not present

## 2015-09-07 DIAGNOSIS — D631 Anemia in chronic kidney disease: Secondary | ICD-10-CM | POA: Diagnosis not present

## 2015-09-10 DIAGNOSIS — N2581 Secondary hyperparathyroidism of renal origin: Secondary | ICD-10-CM | POA: Diagnosis not present

## 2015-09-10 DIAGNOSIS — D509 Iron deficiency anemia, unspecified: Secondary | ICD-10-CM | POA: Diagnosis not present

## 2015-09-10 DIAGNOSIS — E119 Type 2 diabetes mellitus without complications: Secondary | ICD-10-CM | POA: Diagnosis not present

## 2015-09-10 DIAGNOSIS — N186 End stage renal disease: Secondary | ICD-10-CM | POA: Diagnosis not present

## 2015-09-10 DIAGNOSIS — Z992 Dependence on renal dialysis: Secondary | ICD-10-CM | POA: Diagnosis not present

## 2015-09-10 DIAGNOSIS — D631 Anemia in chronic kidney disease: Secondary | ICD-10-CM | POA: Diagnosis not present

## 2015-09-12 DIAGNOSIS — E119 Type 2 diabetes mellitus without complications: Secondary | ICD-10-CM | POA: Diagnosis not present

## 2015-09-12 DIAGNOSIS — D509 Iron deficiency anemia, unspecified: Secondary | ICD-10-CM | POA: Diagnosis not present

## 2015-09-12 DIAGNOSIS — Z992 Dependence on renal dialysis: Secondary | ICD-10-CM | POA: Diagnosis not present

## 2015-09-12 DIAGNOSIS — D631 Anemia in chronic kidney disease: Secondary | ICD-10-CM | POA: Diagnosis not present

## 2015-09-12 DIAGNOSIS — N186 End stage renal disease: Secondary | ICD-10-CM | POA: Diagnosis not present

## 2015-09-12 DIAGNOSIS — N2581 Secondary hyperparathyroidism of renal origin: Secondary | ICD-10-CM | POA: Diagnosis not present

## 2015-09-14 DIAGNOSIS — D509 Iron deficiency anemia, unspecified: Secondary | ICD-10-CM | POA: Diagnosis not present

## 2015-09-14 DIAGNOSIS — D631 Anemia in chronic kidney disease: Secondary | ICD-10-CM | POA: Diagnosis not present

## 2015-09-14 DIAGNOSIS — N2581 Secondary hyperparathyroidism of renal origin: Secondary | ICD-10-CM | POA: Diagnosis not present

## 2015-09-14 DIAGNOSIS — Z992 Dependence on renal dialysis: Secondary | ICD-10-CM | POA: Diagnosis not present

## 2015-09-14 DIAGNOSIS — N186 End stage renal disease: Secondary | ICD-10-CM | POA: Diagnosis not present

## 2015-09-14 DIAGNOSIS — E119 Type 2 diabetes mellitus without complications: Secondary | ICD-10-CM | POA: Diagnosis not present

## 2015-09-17 DIAGNOSIS — D631 Anemia in chronic kidney disease: Secondary | ICD-10-CM | POA: Diagnosis not present

## 2015-09-17 DIAGNOSIS — N186 End stage renal disease: Secondary | ICD-10-CM | POA: Diagnosis not present

## 2015-09-17 DIAGNOSIS — E119 Type 2 diabetes mellitus without complications: Secondary | ICD-10-CM | POA: Diagnosis not present

## 2015-09-17 DIAGNOSIS — Z992 Dependence on renal dialysis: Secondary | ICD-10-CM | POA: Diagnosis not present

## 2015-09-17 DIAGNOSIS — D509 Iron deficiency anemia, unspecified: Secondary | ICD-10-CM | POA: Diagnosis not present

## 2015-09-17 DIAGNOSIS — N2581 Secondary hyperparathyroidism of renal origin: Secondary | ICD-10-CM | POA: Diagnosis not present

## 2015-09-19 DIAGNOSIS — A499 Bacterial infection, unspecified: Secondary | ICD-10-CM | POA: Insufficient documentation

## 2015-09-19 DIAGNOSIS — D509 Iron deficiency anemia, unspecified: Secondary | ICD-10-CM | POA: Diagnosis not present

## 2015-09-19 DIAGNOSIS — N186 End stage renal disease: Secondary | ICD-10-CM | POA: Diagnosis not present

## 2015-09-19 DIAGNOSIS — Z992 Dependence on renal dialysis: Secondary | ICD-10-CM | POA: Diagnosis not present

## 2015-09-19 DIAGNOSIS — D631 Anemia in chronic kidney disease: Secondary | ICD-10-CM | POA: Diagnosis not present

## 2015-09-19 DIAGNOSIS — N2581 Secondary hyperparathyroidism of renal origin: Secondary | ICD-10-CM | POA: Diagnosis not present

## 2015-09-19 DIAGNOSIS — E119 Type 2 diabetes mellitus without complications: Secondary | ICD-10-CM | POA: Diagnosis not present

## 2015-09-21 DIAGNOSIS — D631 Anemia in chronic kidney disease: Secondary | ICD-10-CM | POA: Diagnosis not present

## 2015-09-21 DIAGNOSIS — E119 Type 2 diabetes mellitus without complications: Secondary | ICD-10-CM | POA: Diagnosis not present

## 2015-09-21 DIAGNOSIS — Z992 Dependence on renal dialysis: Secondary | ICD-10-CM | POA: Diagnosis not present

## 2015-09-21 DIAGNOSIS — N2581 Secondary hyperparathyroidism of renal origin: Secondary | ICD-10-CM | POA: Diagnosis not present

## 2015-09-21 DIAGNOSIS — D509 Iron deficiency anemia, unspecified: Secondary | ICD-10-CM | POA: Diagnosis not present

## 2015-09-21 DIAGNOSIS — N186 End stage renal disease: Secondary | ICD-10-CM | POA: Diagnosis not present

## 2015-09-24 DIAGNOSIS — D631 Anemia in chronic kidney disease: Secondary | ICD-10-CM | POA: Diagnosis not present

## 2015-09-24 DIAGNOSIS — Z992 Dependence on renal dialysis: Secondary | ICD-10-CM | POA: Diagnosis not present

## 2015-09-24 DIAGNOSIS — D509 Iron deficiency anemia, unspecified: Secondary | ICD-10-CM | POA: Diagnosis not present

## 2015-09-24 DIAGNOSIS — N2581 Secondary hyperparathyroidism of renal origin: Secondary | ICD-10-CM | POA: Diagnosis not present

## 2015-09-24 DIAGNOSIS — N186 End stage renal disease: Secondary | ICD-10-CM | POA: Diagnosis not present

## 2015-09-24 DIAGNOSIS — E119 Type 2 diabetes mellitus without complications: Secondary | ICD-10-CM | POA: Diagnosis not present

## 2015-09-26 DIAGNOSIS — E119 Type 2 diabetes mellitus without complications: Secondary | ICD-10-CM | POA: Diagnosis not present

## 2015-09-26 DIAGNOSIS — N186 End stage renal disease: Secondary | ICD-10-CM | POA: Diagnosis not present

## 2015-09-26 DIAGNOSIS — Z992 Dependence on renal dialysis: Secondary | ICD-10-CM | POA: Diagnosis not present

## 2015-09-26 DIAGNOSIS — D631 Anemia in chronic kidney disease: Secondary | ICD-10-CM | POA: Diagnosis not present

## 2015-09-26 DIAGNOSIS — D509 Iron deficiency anemia, unspecified: Secondary | ICD-10-CM | POA: Diagnosis not present

## 2015-09-26 DIAGNOSIS — N2581 Secondary hyperparathyroidism of renal origin: Secondary | ICD-10-CM | POA: Diagnosis not present

## 2015-09-28 DIAGNOSIS — N2581 Secondary hyperparathyroidism of renal origin: Secondary | ICD-10-CM | POA: Diagnosis not present

## 2015-09-28 DIAGNOSIS — N186 End stage renal disease: Secondary | ICD-10-CM | POA: Diagnosis not present

## 2015-09-28 DIAGNOSIS — E119 Type 2 diabetes mellitus without complications: Secondary | ICD-10-CM | POA: Diagnosis not present

## 2015-09-28 DIAGNOSIS — Z992 Dependence on renal dialysis: Secondary | ICD-10-CM | POA: Diagnosis not present

## 2015-09-28 DIAGNOSIS — D631 Anemia in chronic kidney disease: Secondary | ICD-10-CM | POA: Diagnosis not present

## 2015-09-28 DIAGNOSIS — D509 Iron deficiency anemia, unspecified: Secondary | ICD-10-CM | POA: Diagnosis not present

## 2015-10-01 DIAGNOSIS — N186 End stage renal disease: Secondary | ICD-10-CM | POA: Diagnosis not present

## 2015-10-01 DIAGNOSIS — D509 Iron deficiency anemia, unspecified: Secondary | ICD-10-CM | POA: Diagnosis not present

## 2015-10-01 DIAGNOSIS — N2581 Secondary hyperparathyroidism of renal origin: Secondary | ICD-10-CM | POA: Diagnosis not present

## 2015-10-01 DIAGNOSIS — E119 Type 2 diabetes mellitus without complications: Secondary | ICD-10-CM | POA: Diagnosis not present

## 2015-10-01 DIAGNOSIS — Z992 Dependence on renal dialysis: Secondary | ICD-10-CM | POA: Diagnosis not present

## 2015-10-01 DIAGNOSIS — D631 Anemia in chronic kidney disease: Secondary | ICD-10-CM | POA: Diagnosis not present

## 2015-10-03 DIAGNOSIS — N186 End stage renal disease: Secondary | ICD-10-CM | POA: Diagnosis not present

## 2015-10-03 DIAGNOSIS — Z992 Dependence on renal dialysis: Secondary | ICD-10-CM | POA: Diagnosis not present

## 2015-10-03 DIAGNOSIS — E119 Type 2 diabetes mellitus without complications: Secondary | ICD-10-CM | POA: Diagnosis not present

## 2015-10-03 DIAGNOSIS — D509 Iron deficiency anemia, unspecified: Secondary | ICD-10-CM | POA: Diagnosis not present

## 2015-10-03 DIAGNOSIS — N2581 Secondary hyperparathyroidism of renal origin: Secondary | ICD-10-CM | POA: Diagnosis not present

## 2015-10-03 DIAGNOSIS — D631 Anemia in chronic kidney disease: Secondary | ICD-10-CM | POA: Diagnosis not present

## 2015-10-04 DIAGNOSIS — Z992 Dependence on renal dialysis: Secondary | ICD-10-CM | POA: Diagnosis not present

## 2015-10-04 DIAGNOSIS — I12 Hypertensive chronic kidney disease with 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: Secondary | ICD-10-CM | POA: Diagnosis not present

## 2015-10-04 DIAGNOSIS — N186 End stage renal disease: Secondary | ICD-10-CM | POA: Diagnosis not present

## 2015-10-05 DIAGNOSIS — D631 Anemia in chronic kidney disease: Secondary | ICD-10-CM | POA: Diagnosis not present

## 2015-10-05 DIAGNOSIS — Z992 Dependence on renal dialysis: Secondary | ICD-10-CM | POA: Diagnosis not present

## 2015-10-05 DIAGNOSIS — D689 Coagulation defect, unspecified: Secondary | ICD-10-CM | POA: Diagnosis not present

## 2015-10-05 DIAGNOSIS — E119 Type 2 diabetes mellitus without complications: Secondary | ICD-10-CM | POA: Diagnosis not present

## 2015-10-05 DIAGNOSIS — N186 End stage renal disease: Secondary | ICD-10-CM | POA: Diagnosis not present

## 2015-10-05 DIAGNOSIS — T82898A Other specified complication of vascular prosthetic devices, implants and grafts, initial encounter: Secondary | ICD-10-CM

## 2015-10-05 DIAGNOSIS — D509 Iron deficiency anemia, unspecified: Secondary | ICD-10-CM | POA: Diagnosis not present

## 2015-10-05 HISTORY — DX: Other specified complication of vascular prosthetic devices, implants and grafts, initial encounter: T82.898A

## 2015-10-08 DIAGNOSIS — D509 Iron deficiency anemia, unspecified: Secondary | ICD-10-CM | POA: Diagnosis not present

## 2015-10-08 DIAGNOSIS — Z992 Dependence on renal dialysis: Secondary | ICD-10-CM | POA: Diagnosis not present

## 2015-10-08 DIAGNOSIS — D631 Anemia in chronic kidney disease: Secondary | ICD-10-CM | POA: Diagnosis not present

## 2015-10-08 DIAGNOSIS — D689 Coagulation defect, unspecified: Secondary | ICD-10-CM | POA: Diagnosis not present

## 2015-10-08 DIAGNOSIS — E119 Type 2 diabetes mellitus without complications: Secondary | ICD-10-CM | POA: Diagnosis not present

## 2015-10-08 DIAGNOSIS — N186 End stage renal disease: Secondary | ICD-10-CM | POA: Diagnosis not present

## 2015-10-10 DIAGNOSIS — Z992 Dependence on renal dialysis: Secondary | ICD-10-CM | POA: Diagnosis not present

## 2015-10-10 DIAGNOSIS — D509 Iron deficiency anemia, unspecified: Secondary | ICD-10-CM | POA: Diagnosis not present

## 2015-10-10 DIAGNOSIS — D631 Anemia in chronic kidney disease: Secondary | ICD-10-CM | POA: Diagnosis not present

## 2015-10-10 DIAGNOSIS — E119 Type 2 diabetes mellitus without complications: Secondary | ICD-10-CM | POA: Diagnosis not present

## 2015-10-10 DIAGNOSIS — D689 Coagulation defect, unspecified: Secondary | ICD-10-CM | POA: Diagnosis not present

## 2015-10-10 DIAGNOSIS — N186 End stage renal disease: Secondary | ICD-10-CM | POA: Diagnosis not present

## 2015-10-12 DIAGNOSIS — N186 End stage renal disease: Secondary | ICD-10-CM | POA: Diagnosis not present

## 2015-10-12 DIAGNOSIS — D689 Coagulation defect, unspecified: Secondary | ICD-10-CM | POA: Diagnosis not present

## 2015-10-12 DIAGNOSIS — E119 Type 2 diabetes mellitus without complications: Secondary | ICD-10-CM | POA: Diagnosis not present

## 2015-10-12 DIAGNOSIS — D631 Anemia in chronic kidney disease: Secondary | ICD-10-CM | POA: Diagnosis not present

## 2015-10-12 DIAGNOSIS — D509 Iron deficiency anemia, unspecified: Secondary | ICD-10-CM | POA: Diagnosis not present

## 2015-10-12 DIAGNOSIS — Z992 Dependence on renal dialysis: Secondary | ICD-10-CM | POA: Diagnosis not present

## 2015-10-15 DIAGNOSIS — Z992 Dependence on renal dialysis: Secondary | ICD-10-CM | POA: Diagnosis not present

## 2015-10-15 DIAGNOSIS — D689 Coagulation defect, unspecified: Secondary | ICD-10-CM | POA: Diagnosis not present

## 2015-10-15 DIAGNOSIS — E119 Type 2 diabetes mellitus without complications: Secondary | ICD-10-CM | POA: Diagnosis not present

## 2015-10-15 DIAGNOSIS — N186 End stage renal disease: Secondary | ICD-10-CM | POA: Diagnosis not present

## 2015-10-15 DIAGNOSIS — D631 Anemia in chronic kidney disease: Secondary | ICD-10-CM | POA: Diagnosis not present

## 2015-10-15 DIAGNOSIS — D509 Iron deficiency anemia, unspecified: Secondary | ICD-10-CM | POA: Diagnosis not present

## 2015-10-17 DIAGNOSIS — D631 Anemia in chronic kidney disease: Secondary | ICD-10-CM | POA: Diagnosis not present

## 2015-10-17 DIAGNOSIS — D689 Coagulation defect, unspecified: Secondary | ICD-10-CM | POA: Diagnosis not present

## 2015-10-17 DIAGNOSIS — Z992 Dependence on renal dialysis: Secondary | ICD-10-CM | POA: Diagnosis not present

## 2015-10-17 DIAGNOSIS — E119 Type 2 diabetes mellitus without complications: Secondary | ICD-10-CM | POA: Diagnosis not present

## 2015-10-17 DIAGNOSIS — D509 Iron deficiency anemia, unspecified: Secondary | ICD-10-CM | POA: Diagnosis not present

## 2015-10-17 DIAGNOSIS — N186 End stage renal disease: Secondary | ICD-10-CM | POA: Diagnosis not present

## 2015-10-19 ENCOUNTER — Other Ambulatory Visit (HOSPITAL_COMMUNITY): Payer: Self-pay | Admitting: Nephrology

## 2015-10-19 ENCOUNTER — Other Ambulatory Visit: Payer: Self-pay | Admitting: Radiology

## 2015-10-19 DIAGNOSIS — Z992 Dependence on renal dialysis: Principal | ICD-10-CM

## 2015-10-19 DIAGNOSIS — N186 End stage renal disease: Secondary | ICD-10-CM

## 2015-10-22 ENCOUNTER — Other Ambulatory Visit (HOSPITAL_COMMUNITY): Payer: Self-pay | Admitting: Nephrology

## 2015-10-22 ENCOUNTER — Encounter (HOSPITAL_COMMUNITY): Payer: Self-pay

## 2015-10-22 ENCOUNTER — Inpatient Hospital Stay (HOSPITAL_COMMUNITY)
Admission: RE | Admit: 2015-10-22 | Discharge: 2015-10-23 | DRG: 286 | Disposition: A | Discharge status: Home / Self Care | Payer: Medicare Other | Source: Ambulatory Visit | Attending: Nephrology | Admitting: Nephrology

## 2015-10-22 DIAGNOSIS — K219 Gastro-esophageal reflux disease without esophagitis: Secondary | ICD-10-CM | POA: Diagnosis present

## 2015-10-22 DIAGNOSIS — Z8249 Family history of ischemic heart disease and other diseases of the circulatory system: Secondary | ICD-10-CM

## 2015-10-22 DIAGNOSIS — Z992 Dependence on renal dialysis: Principal | ICD-10-CM

## 2015-10-22 DIAGNOSIS — T829XXA Unspecified complication of cardiac and vascular prosthetic device, implant and graft, initial encounter: Secondary | ICD-10-CM | POA: Diagnosis present

## 2015-10-22 DIAGNOSIS — D631 Anemia in chronic kidney disease: Secondary | ICD-10-CM | POA: Diagnosis present

## 2015-10-22 DIAGNOSIS — Z4901 Encounter for fitting and adjustment of extracorporeal dialysis catheter: Secondary | ICD-10-CM | POA: Diagnosis not present

## 2015-10-22 DIAGNOSIS — I12 Hypertensive chronic kidney disease with 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: Secondary | ICD-10-CM | POA: Diagnosis present

## 2015-10-22 DIAGNOSIS — E875 Hyperkalemia: Secondary | ICD-10-CM | POA: Diagnosis not present

## 2015-10-22 DIAGNOSIS — T82898A Other specified complication of vascular prosthetic devices, implants and grafts, initial encounter: Secondary | ICD-10-CM | POA: Diagnosis present

## 2015-10-22 DIAGNOSIS — N186 End stage renal disease: Secondary | ICD-10-CM

## 2015-10-22 DIAGNOSIS — F17210 Nicotine dependence, cigarettes, uncomplicated: Secondary | ICD-10-CM | POA: Diagnosis present

## 2015-10-22 DIAGNOSIS — N2581 Secondary hyperparathyroidism of renal origin: Secondary | ICD-10-CM | POA: Diagnosis present

## 2015-10-22 DIAGNOSIS — Z79899 Other long term (current) drug therapy: Secondary | ICD-10-CM

## 2015-10-22 DIAGNOSIS — Y832 Surgical operation with anastomosis, bypass or graft as the cause of abnormal reaction of the patient, or of later complication, without mention of misadventure at the time of the procedure: Secondary | ICD-10-CM | POA: Diagnosis present

## 2015-10-22 DIAGNOSIS — I10 Essential (primary) hypertension: Secondary | ICD-10-CM | POA: Diagnosis present

## 2015-10-22 DIAGNOSIS — T82868S Thrombosis of vascular prosthetic devices, implants and grafts, sequela: Secondary | ICD-10-CM | POA: Diagnosis not present

## 2015-10-22 DIAGNOSIS — J449 Chronic obstructive pulmonary disease, unspecified: Secondary | ICD-10-CM | POA: Diagnosis present

## 2015-10-22 HISTORY — PX: INSERTION OF DIALYSIS CATHETER: SHX1324

## 2015-10-22 HISTORY — PX: IR GENERIC HISTORICAL: IMG1180011

## 2015-10-22 HISTORY — DX: Other specified complication of vascular prosthetic devices, implants and grafts, initial encounter: T82.898A

## 2015-10-22 LAB — BASIC METABOLIC PANEL
Anion gap: 19 — ABNORMAL HIGH (ref 5–15)
BUN: 77 mg/dL — ABNORMAL HIGH (ref 6–20)
CHLORIDE: 96 mmol/L — AB (ref 101–111)
CO2: 24 mmol/L (ref 22–32)
Calcium: 9 mg/dL (ref 8.9–10.3)
Creatinine, Ser: 23.05 mg/dL — ABNORMAL HIGH (ref 0.61–1.24)
GFR, EST AFRICAN AMERICAN: 2 mL/min — AB (ref >60)
GFR, EST NON AFRICAN AMERICAN: 2 mL/min — AB (ref >60)
Glucose, Bld: 88 mg/dL (ref 65–99)
POTASSIUM: 6.8 mmol/L — AB (ref 3.5–5.1)
SODIUM: 139 mmol/L (ref 135–145)

## 2015-10-22 LAB — MRSA PCR SCREENING: MRSA by PCR: NEGATIVE

## 2015-10-22 LAB — CBC
HEMATOCRIT: 35.9 % — AB (ref 39.0–52.0)
Hemoglobin: 11.8 g/dL — ABNORMAL LOW (ref 13.0–17.0)
MCH: 34.4 pg — ABNORMAL HIGH (ref 26.0–34.0)
MCHC: 32.9 g/dL (ref 30.0–36.0)
MCV: 104.7 fL — AB (ref 78.0–100.0)
PLATELETS: 261 10*3/uL (ref 150–400)
RBC: 3.43 MIL/uL — AB (ref 4.22–5.81)
RDW: 15.1 % (ref 11.5–15.5)
WBC: 7.8 10*3/uL (ref 4.0–10.5)

## 2015-10-22 LAB — PROTIME-INR
INR: 1.06
Prothrombin Time: 13.8 seconds (ref 11.4–15.2)

## 2015-10-22 LAB — APTT: aPTT: 34 seconds (ref 24–36)

## 2015-10-22 MED ORDER — LISINOPRIL 40 MG PO TABS
40.0000 mg | ORAL_TABLET | Freq: Every day | ORAL | Status: DC
  Administered 2015-10-22: 40 mg via ORAL
  Filled 2015-10-22: qty 1

## 2015-10-22 MED ORDER — ALPRAZOLAM 0.5 MG PO TABS
1.0000 mg | ORAL_TABLET | Freq: Every evening | ORAL | Status: DC | PRN
  Administered 2015-10-22: 1 mg via ORAL
  Filled 2015-10-22: qty 2

## 2015-10-22 MED ORDER — SODIUM CHLORIDE 0.9 % IV SOLN: 100.0000 mL | INTRAVENOUS | Status: DC | PRN

## 2015-10-22 MED ORDER — ACETAMINOPHEN 325 MG PO TABS: 650.0000 mg | ORAL_TABLET | Freq: Four times a day (QID) | ORAL | Status: DC | PRN

## 2015-10-22 MED ORDER — LIDOCAINE HCL (PF) 1 % IJ SOLN: 5.0000 mL | INTRAMUSCULAR | Status: DC | PRN

## 2015-10-22 MED ORDER — HEPARIN SODIUM (PORCINE) 1000 UNIT/ML DIALYSIS: 2000.0000 [IU] | Freq: Once | INTRAMUSCULAR | Status: DC

## 2015-10-22 MED ORDER — CLONIDINE HCL 0.3 MG/24HR TD PTWK: 0.3000 mg | MEDICATED_PATCH | TRANSDERMAL | Status: DC

## 2015-10-22 MED ORDER — SODIUM CHLORIDE 0.9 % IV SOLN: 250.0000 mL | INTRAVENOUS | Status: DC | PRN

## 2015-10-22 MED ORDER — HEPARIN SODIUM (PORCINE) 1000 UNIT/ML IJ SOLN
INTRAMUSCULAR | Status: AC
  Filled 2015-10-22: qty 1

## 2015-10-22 MED ORDER — SODIUM CHLORIDE 0.9% FLUSH
3.0000 mL | Freq: Two times a day (BID) | INTRAVENOUS | Status: DC
  Administered 2015-10-22 – 2015-10-23 (×3): 3 mL via INTRAVENOUS

## 2015-10-22 MED ORDER — SODIUM CHLORIDE 0.9% FLUSH: 3.0000 mL | INTRAVENOUS | Status: DC | PRN

## 2015-10-22 MED ORDER — TAMSULOSIN HCL 0.4 MG PO CAPS
0.4000 mg | ORAL_CAPSULE | Freq: Every day | ORAL | Status: DC
  Administered 2015-10-22 – 2015-10-23 (×2): 0.4 mg via ORAL
  Filled 2015-10-22 (×2): qty 1

## 2015-10-22 MED ORDER — SODIUM CHLORIDE 0.9% FLUSH
3.0000 mL | Freq: Two times a day (BID) | INTRAVENOUS | Status: DC
  Administered 2015-10-23: 3 mL via INTRAVENOUS

## 2015-10-22 MED ORDER — HYDROCODONE-ACETAMINOPHEN 5-325 MG PO TABS
1.0000 | ORAL_TABLET | Freq: Four times a day (QID) | ORAL | Status: DC | PRN
  Administered 2015-10-22: 2 via ORAL
  Filled 2015-10-22: qty 2

## 2015-10-22 MED ORDER — SEVELAMER CARBONATE 800 MG PO TABS
800.0000 mg | ORAL_TABLET | Freq: Three times a day (TID) | ORAL | Status: DC
  Administered 2015-10-22: 800 mg via ORAL
  Filled 2015-10-22 (×2): qty 1

## 2015-10-22 MED ORDER — HEPARIN SODIUM (PORCINE) 1000 UNIT/ML DIALYSIS: 1000.0000 [IU] | INTRAMUSCULAR | Status: DC | PRN

## 2015-10-22 MED ORDER — ONDANSETRON HCL 4 MG PO TABS
4.0000 mg | ORAL_TABLET | Freq: Four times a day (QID) | ORAL | Status: DC | PRN
  Filled 2015-10-22: qty 1

## 2015-10-22 MED ORDER — SODIUM CHLORIDE 0.9 % IV SOLN: INTRAVENOUS | Status: DC

## 2015-10-22 MED ORDER — LIDOCAINE HCL 1 % IJ SOLN
INTRAMUSCULAR | Status: AC
  Filled 2015-10-22: qty 20

## 2015-10-22 MED ORDER — ONDANSETRON HCL 4 MG/2ML IJ SOLN: 4.0000 mg | Freq: Four times a day (QID) | INTRAMUSCULAR | Status: DC | PRN

## 2015-10-22 MED ORDER — ALTEPLASE 2 MG IJ SOLR: 2.0000 mg | Freq: Once | INTRAMUSCULAR | Status: DC | PRN

## 2015-10-22 MED ORDER — POLYETHYLENE GLYCOL 3350 17 G PO PACK
17.0000 g | PACK | Freq: Every day | ORAL | Status: DC | PRN
  Filled 2015-10-22: qty 1

## 2015-10-22 MED ORDER — PENTAFLUOROPROP-TETRAFLUOROETH EX AERO: 1.0000 "application " | INHALATION_SPRAY | CUTANEOUS | Status: DC | PRN

## 2015-10-22 MED ORDER — ACETAMINOPHEN 650 MG RE SUPP
650.0000 mg | Freq: Four times a day (QID) | RECTAL | Status: DC | PRN
  Filled 2015-10-22: qty 1

## 2015-10-22 MED ORDER — METOPROLOL SUCCINATE ER 50 MG PO TB24
50.0000 mg | ORAL_TABLET | Freq: Every day | ORAL | Status: DC
  Administered 2015-10-22: 50 mg via ORAL
  Filled 2015-10-22 (×2): qty 1

## 2015-10-22 MED ORDER — LIDOCAINE-PRILOCAINE 2.5-2.5 % EX CREA: 1.0000 "application " | TOPICAL_CREAM | CUTANEOUS | Status: DC | PRN

## 2015-10-22 MED ORDER — ALTEPLASE 100 MG IV SOLR
2.0000 mg | Freq: Once | INTRAVENOUS | Status: DC
  Filled 2015-10-22: qty 2

## 2015-10-22 MED ORDER — ATORVASTATIN CALCIUM 20 MG PO TABS
20.0000 mg | ORAL_TABLET | Freq: Every day | ORAL | Status: DC
  Administered 2015-10-22: 20 mg via ORAL
  Filled 2015-10-22: qty 1

## 2015-10-22 NOTE — Procedures (Signed)
Interventional Radiology Procedure Note  Procedure:  Right IJ non-tunneled HD catheter  Complications:  None  Estimated Blood Loss: < 10 mL  20 cm temp cath placed with tip in RA.  For HD today.  Planned declot of left arm AVF cancelled today due to K of 6.8.  After HD in hospital, may be able to proceed with attempt at AVF declot tomorrow.  Venetia Night. Kathlene Cote, M.D Pager:  [phone]

## 2015-10-22 NOTE — H&P (Signed)
Renal Service History & Physical [facility]  James Wall is an 50 y.o. male.  Chief Complaint: Clotted HD access HPI: 50 yo with hx of HTN, COPD, cirrhosis and ESRD on HD for 9 years.  He had initially a LUA AVF that worked for 7 yrs, then a RUA access that worked for about 1 year, then in July 2016 had new second [place] AVF that has worked well until today he came in w know flow / bruit in the fistula.  Was referred to IR for declot but his K was 6.9, so procedure not done and a temp cath was placed by IR and I was asked to admit pt for hyperkalemia and dialysis. Then attempted declot planned for tomorrow.    Patient has no specific c/o's , no CP/ SOB / fevers / chills.  No abd pain , n/v/d.      Past Medical History  Past Medical History:  Diagnosis Date  . Anemia   . Anxiety   . Arthritis    GOUT - pt not sure if this is true  . Cirrhosis, nonalcoholic ([place])   . COPD (chronic obstructive pulmonary disease) ([place])   . Depression   . ESRD on hemodialysis [facility])    Started HD Jan 2009.  ESRD was due to HTN.  Dx'd with HTN in hospital 1996 according to pt, they had to keep him so he could get Medicaid to afford the BP medications.  First saw a nephrologist and started HD in the same year 2009.  Gets HD at [facility] on a MWF schedule.  Does not have DM. He had a left RC AVF that never functioned, a left upper arm AVF that worked for about 5 years and as of June  . GERD (gastroesophageal reflux disease)   . Hypertension   . Renal insufficiency   . Secondary hyperparathyroidism ([place])   . Sepsis ([place]) 02/2013   from AVF , treated with Vancomycin.  Marland Kitchen Shortness of breath    With exertion  . Sleep apnea    no  longer using cpap   Past Surgical History  Past Surgical History:  Procedure Laterality Date  . AV FISTULA PLACEMENT  2009   Left lower arm AVF  . AV FISTULA PLACEMENT Right 02/22/2013   Procedure:  CREATION  OF BRACHIAL CEPHALIC FISTULA RIGHT ARM;  Surgeon:  Elam Dutch, MD;  Location: [place];  Service: Vascular;  Laterality: Right;  . AV FISTULA PLACEMENT Left 08/10/2014   Procedure: BASILIC VEIN TRANSPOSITION  ARTERIOVENOUS (AV) FISTULA CREATION LEFT UPPER ARM;  Surgeon: Mal Misty, MD;  Location: [place];  Service: Vascular;  Laterality: Left;  . COLONOSCOPY    . ESOPHAGOGASTRODUODENOSCOPY (EGD) WITH PROPOFOL N/A 04/12/2013   Procedure: ESOPHAGOGASTRODUODENOSCOPY (EGD) WITH PROPOFOL;  Surgeon: Arta Silence, MD;  Location: [facility];  Service: Endoscopy;  Laterality: N/A;  . INSERTION OF DIALYSIS CATHETER N/A 12/23/2012   Procedure: INSERTION OF DIALYSIS CATHETER; ULTRASOUND GUIDED;  Surgeon: Angelia Mould, MD;  Location: [place];  Service: Vascular;  Laterality: N/A;  . IR GENERIC HISTORICAL  10/22/2015   IR US GUIDE VASC ACCESS RIGHT 10/22/2015 [facility]INTER[facility]  . IR GENERIC HISTORICAL  10/22/2015   IR FLUORO GUIDE CV LINE RIGHT 10/22/2015 [facility]INTER[facility]  . LEFT HEART CATHETERIZATION WITH CORONARY ANGIOGRAM N/A 07/13/2013   Procedure: LEFT HEART CATHETERIZATION WITH CORONARY ANGIOGRAM;  Surgeon: Jettie Booze, MD;  Location: [facility] CATH LAB;  Service: Cardiovascular;  Laterality: N/A;  .  LIGATION OF ARTERIOVENOUS  FISTULA Left 12/22/2012   Procedure: LIGATION OF ARTERIOVENOUS  FISTULA;EXCISION OF LARGE ANEURYSMS;;  Surgeon: Elam Dutch, MD;  Location: [facility] OR;  Service: Vascular;  Laterality: Left;   Family History  Family History  Problem Relation Age of Onset  . Hypertension Mother   . Cerebrovascular Accident Father   . Hypertension Father   . Congestive Heart Failure Brother   . Asthma Brother    Social History  reports that he has been smoking Cigarettes.  He has a 8.00 pack-year smoking history. He has never used smokeless tobacco. He reports that he drinks alcohol. He reports that he does not use drugs. Allergies  Allergies  Allergen Reactions  . Aspirin     Tends to make him "feel worse" when he tries to take it for a  cold or something   Home medications Prior to Admission medications   Medication Sig Start Date End Date Taking? Authorizing Provider  ALPRAZolam Duanne Moron) 1 MG tablet Take 1 mg by mouth at bedtime as needed for anxiety.   Yes Historical Provider, MD  atorvastatin (LIPITOR) 20 MG tablet Take 1 tablet (20 mg total) by mouth daily. 06/21/13  Yes Dorothy Spark, MD  cloNIDine (CATAPRES - DOSED IN MG/24 HR) 0.3 mg/24hr Place 1 patch onto the skin once a week. Saturday.   Yes Historical Provider, MD  esomeprazole (NEXIUM) 40 MG capsule Take 40-80 mg by mouth daily as needed (reflux/indigestion. Dose varies depending on severity.).    Yes Historical Provider, MD  lisinopril (PRINIVIL,ZESTRIL) 40 MG tablet Take 40 mg by mouth at bedtime.    Yes Historical Provider, MD  metoprolol succinate (TOPROL-XL) 50 MG 24 hr tablet Take 1 tablet (50 mg total) by mouth daily. Take with or immediately following a meal. 05/22/15  Yes Dorothy Spark, MD  sevelamer carbonate (RENVELA) 800 MG tablet Take 800 mg by mouth 3 (three) times daily with meals. Takes 5-6 pills with a big meal, 3-4 pills with small meals   Yes Historical Provider, MD  tamsulosin (FLOMAX) 0.4 MG CAPS capsule Take 0.4 mg by mouth daily. 08/01/14  Yes Historical Provider, MD   Liver Function Tests No results for input(s): AST, ALT, ALKPHOS, BILITOT, PROT, ALBUMIN in the last 168 hours. No results for input(s): LIPASE, AMYLASE in the last 168 hours. CBC  Recent Labs Lab 10/22/15 0802  WBC 7.8  HGB 11.8*  HCT 35.9*  MCV 104.7*  PLT 809   Basic Metabolic Panel  Recent Labs Lab 10/22/15 0802  NA 139  K 6.8*  CL 96*  CO2 24  GLUCOSE 88  BUN 77*  CREATININE 23.05*  CALCIUM 9.0    Vitals:   10/22/15 0811 10/22/15 1024  BP: (!) 138/95 (!) 135/91  Pulse: 69 63  Resp: 20   Temp: 98.4 F (36.9 C)   TempSrc: Oral   SpO2: 100%   Weight: 79.4 kg (175 lb)   Height: 5' 5.5" (1.664 m)    Exam: Alert, no distress No rash,  cyanosis or gangrene Sclera anicteric, throat clear Chest CTA bilat RRR no mrg Abd soft ntnd no mass/ ascites Ext no edema LE's LUA AVF - no bruit NF, ox 3  HD: [facility] [place]  3.5h  450/800 81kg   2/2 bath  Hep 4000 / 1000 prn  Venofer 50/wk Last Hb 12.3, no ESA  Assessment: 1. Hyperkalemia - plan HD today per temp cath 2. Clotted LUA AVF - IR plans attempted declot tomorrow 3.  ESRD MWF HD 4. Volume - get records 5. Anemia - Hb ok  Plan - as above   Kelly Splinter MD [place] pager [phone]    cell [phone] 10/22/2015, 11:21 AM

## 2015-10-22 NOTE — Procedures (Signed)
  I was present at this dialysis session, have reviewed the session itself and made  appropriate changes Kelly Splinter MD [place] pager [phone]    cell [phone] 10/22/2015, 12:29 PM

## 2015-10-22 NOTE — Progress Notes (Signed)
Patient states he is not diabetic refused finger sticks.  Trevonne Nyland, Tivis Ringer, RN

## 2015-10-22 NOTE — Progress Notes (Signed)
Pre HD K 6.8.  Wants to sign off after 3 hr - c/o stomach hurting.  BP 120s. Goal 4.5 with 3.4 off.  Agreed very reluctantly to stay on 15 more min.  His first hour of HD was 1 K then changed to 2 K for the remainder of treatment.  He notorious runs abbreviated treatments which likely contributed to ^ K.  Del Kt/vs are around 1.0 on average.  Amalia Hailey, PA-C

## 2015-10-22 NOTE — H&P (Signed)
Chief Complaint: clotted AV fistula  Referring Physician:James Augustin Coupe  Supervising Physician: Aletta Edouard  Patient Status:Out-pt  HPI: James Wall is an 50 y.o. male with a history of ESRD on HD with a LUE AVF.  He went to HD on Wednesday and had no issues, but used his arm on Thursday to unclog his bathtub.  He forgot he wasn't supposed to use it.  On Friday when he went to HD, and when they stuck him they pulled out lots of clot.  He was unable to get HD done at that time.  He was arranged for a declot today and presents for this procedure.  Past Medical History:  Past Medical History:  Diagnosis Date  . Anemia   . Anxiety   . Arthritis    GOUT - pt not sure if this is true  . Cirrhosis, nonalcoholic ([place])   . COPD (chronic obstructive pulmonary disease) ([place])   . Depression   . ESRD on hemodialysis [facility])    Started HD Jan 2009.  ESRD was due to HTN.  Dx'd with HTN in hospital 1996 according to pt, they had to keep him so he could get Medicaid to afford the BP medications.  First saw a nephrologist and started HD in the same year 2009.  Gets HD at [facility] on a MWF schedule.  Does not have DM. He had a left RC AVF that never functioned, a left upper arm AVF that worked for about 5 years and as of June  . GERD (gastroesophageal reflux disease)   . Hypertension   . Renal insufficiency   . Secondary hyperparathyroidism ([place])   . Sepsis ([place]) 02/2013   from AVF , treated with Vancomycin.  Marland Kitchen Shortness of breath    With exertion  . Sleep apnea    no  longer using cpap    Past Surgical History:  Past Surgical History:  Procedure Laterality Date  . AV FISTULA PLACEMENT  2009   Left lower arm AVF  . AV FISTULA PLACEMENT Right 02/22/2013   Procedure:  CREATION  OF BRACHIAL CEPHALIC FISTULA RIGHT ARM;  Surgeon: Elam Dutch, MD;  Location: [place];  Service: Vascular;  Laterality: Right;  . AV FISTULA PLACEMENT Left 08/10/2014   Procedure: BASILIC VEIN TRANSPOSITION   ARTERIOVENOUS (AV) FISTULA CREATION LEFT UPPER ARM;  Surgeon: Mal Misty, MD;  Location: [place];  Service: Vascular;  Laterality: Left;  . COLONOSCOPY    . ESOPHAGOGASTRODUODENOSCOPY (EGD) WITH PROPOFOL N/A 04/12/2013   Procedure: ESOPHAGOGASTRODUODENOSCOPY (EGD) WITH PROPOFOL;  Surgeon: Arta Silence, MD;  [facility];  Service: Endoscopy;  Laterality: N/A;  . INSERTION OF DIALYSIS CATHETER N/A 12/23/2012   Procedure: INSERTION OF DIALYSIS CATHETER; ULTRASOUND GUIDED;  Surgeon: Angelia Mould, MD;  Location: [place];  Service: Vascular;  Laterality: N/A;  . LEFT HEART CATHETERIZATION WITH CORONARY ANGIOGRAM N/A 07/13/2013   Procedure: LEFT HEART CATHETERIZATION WITH CORONARY ANGIOGRAM;  Surgeon: Jettie Booze, MD;  Location: [facility] CATH LAB;  Service: Cardiovascular;  Laterality: N/A;  . LIGATION OF ARTERIOVENOUS  FISTULA Left 12/22/2012   Procedure: LIGATION OF ARTERIOVENOUS  FISTULA;EXCISION OF LARGE ANEURYSMS;;  Surgeon: Elam Dutch, MD;  Location: [facility] OR;  Service: Vascular;  Laterality: Left;    Family History:  Family History  Problem Relation Age of Onset  . Hypertension Mother   . Cerebrovascular Accident Father   . Hypertension Father   . Congestive Heart Failure Brother   . Asthma Brother  Social History:  reports that he has been smoking Cigarettes.  He has a 8.00 pack-year smoking history. He has never used smokeless tobacco. He reports that he drinks alcohol. He reports that he does not use drugs.  Allergies:  Allergies  Allergen Reactions  . Aspirin     Tends to make him "feel worse" when he tries to take it for a cold or something    Medications: Medications reviewed in Epic  Please HPI for pertinent positives, otherwise complete 10 system ROS negative.  Mallampati Score: MD Evaluation Airway: WNL Heart: WNL Abdomen: WNL Chest/ Lungs: WNL ASA  Classification: 3 Mallampati/Airway Score: Two  Physical Exam: BP (!) 138/95   Pulse  69   Temp 98.4 F (36.9 C) (Oral)   Resp 20   Ht 5' 5.5" (1.664 m)   Wt 175 lb (79.4 kg)   SpO2 100%   BMI 28.68 kg/m  Body mass index is 28.68 kg/m. General: pleasant, WD, WN black male who is laying in bed in NAD HEENT: head is normocephalic, atraumatic.  Sclera are noninjected.  PERRL.  Ears and nose without any masses or lesions.  Mouth is pink and moist Heart: regular, rate, and rhythm.  Normal s1,s2. No obvious murmurs, gallops, or rubs noted.   Lungs: CTAB, no wheezes, rhonchi, or rales noted.  Respiratory effort nonlabored Abd: soft, NT, ND, +BS, no masses, hernias, or organomegaly MS: LUE with AVF palpable, but no palpable thrill or bruit. Psych: A&Ox3 with an appropriate affect.   Labs: Results for orders placed or performed during the hospital encounter of 10/22/15 (from the past 48 hour(s))  APTT     Status: None   Collection Time: 10/22/15  8:02 AM  Result Value Ref Range   aPTT 34 24 - 36 seconds  Basic metabolic panel     Status: Abnormal   Collection Time: 10/22/15  8:02 AM  Result Value Ref Range   Sodium 139 135 - 145 mmol/L   Potassium 6.8 (HH) 3.5 - 5.1 mmol/L    Comment: SLIGHT HEMOLYSIS CRITICAL RESULT CALLED TO, READ BACK BY AND VERIFIED WITH: S SPENCER,RN [id number] WILDERK    Chloride 96 (L) 101 - 111 mmol/L   CO2 24 22 - 32 mmol/L   Glucose, Bld 88 65 - 99 mg/dL   BUN 77 (H) 6 - 20 mg/dL   Creatinine, Ser 23.05 (H) 0.61 - 1.24 mg/dL   Calcium 9.0 8.9 - 10.3 mg/dL   GFR calc non Af Amer 2 (L) >60 mL/min   GFR calc Af Amer 2 (L) >60 mL/min    Comment: (NOTE) The eGFR has been calculated using the CKD EPI equation. This calculation has not been validated in all clinical situations. eGFR's persistently <60 mL/min signify possible Chronic Kidney Disease.    Anion gap 19 (H) 5 - 15  CBC     Status: Abnormal   Collection Time: 10/22/15  8:02 AM  Result Value Ref Range   WBC 7.8 4.0 - 10.5 K/uL   RBC 3.43 (L) 4.22 - 5.81 MIL/uL   Hemoglobin  11.8 (L) 13.0 - 17.0 g/dL   HCT 35.9 (L) 39.0 - 52.0 %   MCV 104.7 (H) 78.0 - 100.0 fL   MCH 34.4 (H) 26.0 - 34.0 pg   MCHC 32.9 30.0 - 36.0 g/dL   RDW 15.1 11.5 - 15.5 %   Platelets 261 150 - 400 K/uL  Protime-INR     Status: None   Collection Time: 10/22/15  8:02 AM  Result Value Ref Range   Prothrombin Time 13.8 11.4 - 15.2 seconds   INR 1.06     Imaging: No results found.  Assessment/Plan 1. Clotted AVF -patient's K is markedly elevated at 6.8 today.  We will place a temp cath and then the patient will be admitted for HD. Once he gets HD, then we will plan for a sedated case for his declot tomorrow.  This was discussed between Dr. Kathlene Cote and the nephrologist who is here today.  Thank you for this interesting consult.  I greatly enjoyed meeting Tirth Cothron Ko Olina and look forward to participating in their care.  A copy of this report was sent to the requesting provider on this date.  Electronically Signed: Henreitta Cea 10/22/2015, 9:30 AM   I spent a total of  30 Minutes   in face to face in clinical consultation, greater than 50% of which was counseling/coordinating care for clotted AVF

## 2015-10-23 ENCOUNTER — Inpatient Hospital Stay (HOSPITAL_COMMUNITY): Payer: Medicare Other

## 2015-10-23 ENCOUNTER — Encounter (HOSPITAL_COMMUNITY): Payer: Self-pay | Admitting: Interventional Radiology

## 2015-10-23 DIAGNOSIS — T82868S Thrombosis of vascular prosthetic devices, implants and grafts, sequela: Secondary | ICD-10-CM

## 2015-10-23 DIAGNOSIS — N186 End stage renal disease: Secondary | ICD-10-CM

## 2015-10-23 HISTORY — PX: IR GENERIC HISTORICAL: IMG1180011

## 2015-10-23 LAB — BASIC METABOLIC PANEL
ANION GAP: 15 (ref 5–15)
BUN: 42 mg/dL — ABNORMAL HIGH (ref 6–20)
CALCIUM: 9.3 mg/dL (ref 8.9–10.3)
CHLORIDE: 98 mmol/L — AB (ref 101–111)
CO2: 25 mmol/L (ref 22–32)
Creatinine, Ser: 16.59 mg/dL — ABNORMAL HIGH (ref 0.61–1.24)
GFR calc non Af Amer: 3 mL/min — ABNORMAL LOW (ref >60)
GFR, EST AFRICAN AMERICAN: 3 mL/min — AB (ref >60)
Glucose, Bld: 84 mg/dL (ref 65–99)
POTASSIUM: 5.2 mmol/L — AB (ref 3.5–5.1)
Sodium: 138 mmol/L (ref 135–145)

## 2015-10-23 LAB — PHOSPHORUS: Phosphorus: 8 mg/dL — ABNORMAL HIGH (ref 2.5–4.6)

## 2015-10-23 LAB — CBC
HCT: 38 % — ABNORMAL LOW (ref 39.0–52.0)
HEMOGLOBIN: 12.1 g/dL — AB (ref 13.0–17.0)
MCH: 33.9 pg (ref 26.0–34.0)
MCHC: 31.8 g/dL (ref 30.0–36.0)
MCV: 106.4 fL — ABNORMAL HIGH (ref 78.0–100.0)
Platelets: 224 10*3/uL (ref 150–400)
RBC: 3.57 MIL/uL — ABNORMAL LOW (ref 4.22–5.81)
RDW: 14.6 % (ref 11.5–15.5)
WBC: 7.3 10*3/uL (ref 4.0–10.5)

## 2015-10-23 LAB — ALBUMIN: ALBUMIN: 3.3 g/dL — AB (ref 3.5–5.0)

## 2015-10-23 MED ORDER — HEPARIN SODIUM (PORCINE) 1000 UNIT/ML IJ SOLN
INTRAMUSCULAR | Status: AC
  Filled 2015-10-23: qty 1

## 2015-10-23 MED ORDER — MIDAZOLAM HCL 2 MG/2ML IJ SOLN
INTRAMUSCULAR | Status: AC | PRN
  Administered 2015-10-23 (×2): 1 mg via INTRAVENOUS

## 2015-10-23 MED ORDER — MIDAZOLAM HCL 2 MG/2ML IJ SOLN
INTRAMUSCULAR | Status: AC
  Filled 2015-10-23: qty 2

## 2015-10-23 MED ORDER — FENTANYL CITRATE (PF) 100 MCG/2ML IJ SOLN
INTRAMUSCULAR | Status: AC | PRN
  Administered 2015-10-23 (×2): 50 ug via INTRAVENOUS

## 2015-10-23 MED ORDER — FENTANYL CITRATE (PF) 100 MCG/2ML IJ SOLN
INTRAMUSCULAR | Status: AC
  Filled 2015-10-23: qty 2

## 2015-10-23 MED ORDER — CHLORHEXIDINE GLUCONATE 4 % EX LIQD
CUTANEOUS | Status: AC
  Filled 2015-10-23: qty 15

## 2015-10-23 MED ORDER — LIDOCAINE HCL 1 % IJ SOLN
INTRAMUSCULAR | Status: AC
  Filled 2015-10-23: qty 20

## 2015-10-23 MED ORDER — CEFAZOLIN SODIUM-DEXTROSE 2-4 GM/100ML-% IV SOLN
2.0000 g | INTRAVENOUS | Status: DC
  Filled 2015-10-23 (×2): qty 100

## 2015-10-23 MED ORDER — ALPRAZOLAM 0.25 MG PO TABS
0.2500 mg | ORAL_TABLET | Freq: Once | ORAL | Status: AC
  Administered 2015-10-23: 0.25 mg via ORAL
  Filled 2015-10-23: qty 1

## 2015-10-23 MED ORDER — CEFAZOLIN SODIUM-DEXTROSE 2-4 GM/100ML-% IV SOLN
INTRAVENOUS | Status: AC
  Filled 2015-10-23: qty 100

## 2015-10-23 NOTE — Progress Notes (Signed)
Patient ID: James Wall, male   DOB: 21-Sep-1965, 50 y.o.   MRN: [id number] LUE AVG was evaluated by [place]. 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 This was discussed with the Nephrologist and surgical consultation is recommended.

## 2015-10-23 NOTE — Procedures (Signed)
RIJV HD Cath 23 cm SVC RA No comp/EBL

## 2015-10-23 NOTE — Consult Note (Signed)
Consult Note  Patient name: James Wall MRN: [id number] DOB: 11-14-1965 Sex: male  Consulting Physician:  Dr. Melvia [name]  Reason for Consult: No chief complaint on file.   HISTORY OF PRESENT ILLNESS: 50 year old male with ESRD, s/p left BVT by Dr. Kellie Simmering in 08-2014.  He presented with a clotted fistula on 10-22-2015.  He was referred to IR for a declot, but his K+ was 6.9 so a temporary catheter was placed.  IR later declined to do a declot due to aneurysmal fistula, and so a surgical evaluation was recommended.  The patient is very tearful about his current situation and is unsure if he even wants to continue dialysis.  He does not want surgery, but rather just a catheter so he can go home.  He denies fevers or chills.  He has a history of multiple access in the past including most recently a right BCF which occluded over a year ago.  HE also states that his prior left arm access got infected and had to be removed.  He suffers from cirrhosis.  HE takes a statin for hypercholesterolemia.  He is on an ACE-I for hypertension.  He is a current smoker.  Past Medical History:  Diagnosis Date  . Anemia   . Anxiety   . Arthritis    GOUT - pt not sure if this is true  . AV fistula occlusion ([place]) 10/2015  . Cirrhosis, nonalcoholic ([place])   . COPD (chronic obstructive pulmonary disease) ([place])   . Depression   . ESRD on hemodialysis [facility])    Started HD Jan 2009.  ESRD was due to HTN.  Dx'd with HTN in hospital 1996 according to pt, they had to keep him so he could get Medicaid to afford the BP medications.  First saw a nephrologist and started HD in the same year 2009.  Gets HD at [facility] on a MWF schedule.  Does not have DM. He had a left RC AVF that never functioned, a left upper arm AVF that worked for about 5 years and as of June  . GERD (gastroesophageal reflux disease)   . Hypertension   . Renal insufficiency   . Secondary hyperparathyroidism ([place])   . Sepsis ([place]) 02/2013   from  AVF , treated with Vancomycin.  Marland Kitchen Shortness of breath    With exertion  . Sleep apnea    no  longer using cpap    Past Surgical History:  Procedure Laterality Date  . AV FISTULA PLACEMENT  2009   Left lower arm AVF  . AV FISTULA PLACEMENT Right 02/22/2013   Procedure:  CREATION  OF BRACHIAL CEPHALIC FISTULA RIGHT ARM;  Surgeon: Elam Dutch, MD;  Location: [place];  Service: Vascular;  Laterality: Right;  . AV FISTULA PLACEMENT Left 08/10/2014   Procedure: BASILIC VEIN TRANSPOSITION  ARTERIOVENOUS (AV) FISTULA CREATION LEFT UPPER ARM;  Surgeon: Mal Misty, MD;  Location: [place];  Service: Vascular;  Laterality: Left;  . COLONOSCOPY    . ESOPHAGOGASTRODUODENOSCOPY (EGD) WITH PROPOFOL N/A 04/12/2013   Procedure: ESOPHAGOGASTRODUODENOSCOPY (EGD) WITH PROPOFOL;  Surgeon: Arta Silence, MD;  [facility];  Service: Endoscopy;  Laterality: N/A;  . INSERTION OF DIALYSIS CATHETER N/A 12/23/2012   Procedure: INSERTION OF DIALYSIS CATHETER; ULTRASOUND GUIDED;  Surgeon: Angelia Mould, MD;  Location: [facility] OR;  Service: Vascular;  Laterality: N/A;  . INSERTION OF DIALYSIS CATHETER  10/22/2015   Right IJ non-tunneled HD catheter  .  IR GENERIC HISTORICAL  10/22/2015   IR US GUIDE VASC ACCESS RIGHT 10/22/2015 [facility]  . IR GENERIC HISTORICAL  10/22/2015   IR FLUORO GUIDE CV LINE RIGHT 10/22/2015 [facility]  . IR GENERIC HISTORICAL  10/23/2015   IR FLUORO GUIDE CV LINE RIGHT 10/23/2015 Marybelle Killings, MD [facility]  . LEFT HEART CATHETERIZATION WITH CORONARY ANGIOGRAM N/A 07/13/2013   Procedure: LEFT HEART CATHETERIZATION WITH CORONARY ANGIOGRAM;  Surgeon: Jettie Booze, MD;  Location: [facility] CATH LAB;  Service: Cardiovascular;  Laterality: N/A;  . LIGATION OF ARTERIOVENOUS  FISTULA Left 12/22/2012   Procedure: LIGATION OF ARTERIOVENOUS  FISTULA;EXCISION OF LARGE ANEURYSMS;;  Surgeon: Elam Dutch, MD;  Location: [facility] OR;  Service: Vascular;  Laterality: Left;    Social  History   Social History  . Marital status: Single    Spouse name: N/A  . Number of children: 0  . Years of education: 12th   Occupational History  . n/a    Social History Main Topics  . Smoking status: Current Every Day Smoker    Packs/day: 0.50    Years: 16.00    Types: Cigarettes  . Smokeless tobacco: Never Used  . Alcohol use 0.0 oz/week     Comment: occassional  . Drug use: No  . Sexual activity: Not on file   Other Topics Concern  . Not on file   Social History Narrative  . No narrative on file    Family History  Problem Relation Age of Onset  . Hypertension Mother   . Cerebrovascular Accident Father   . Hypertension Father   . Congestive Heart Failure Brother   . Asthma Brother     Allergies as of 10/22/2015 - Review Complete 10/22/2015  Allergen Reaction Noted  . Aspirin  04/06/2008    No current facility-administered medications on file prior to encounter.    Current Outpatient Prescriptions on File Prior to Encounter  Medication Sig Dispense Refill  . ALPRAZolam (XANAX) 1 MG tablet Take 1 mg by mouth at bedtime as needed for anxiety.    Marland Kitchen atorvastatin (LIPITOR) 20 MG tablet Take 1 tablet (20 mg total) by mouth daily. 90 tablet 3  . cloNIDine (CATAPRES - DOSED IN MG/24 HR) 0.3 mg/24hr Place 1 patch onto the skin once a week. Saturday.    . esomeprazole (NEXIUM) 40 MG capsule Take 40-80 mg by mouth daily as needed (reflux/indigestion. Dose varies depending on severity.).     Marland Kitchen lisinopril (PRINIVIL,ZESTRIL) 40 MG tablet Take 40 mg by mouth at bedtime.     . metoprolol succinate (TOPROL-XL) 50 MG 24 hr tablet Take 1 tablet (50 mg total) by mouth daily. Take with or immediately following a meal. 15 tablet 0  . sevelamer carbonate (RENVELA) 800 MG tablet Take 800 mg by mouth 3 (three) times daily with meals. Takes 5-6 pills with a big meal, 3-4 pills with small meals    . tamsulosin (FLOMAX) 0.4 MG CAPS capsule Take 0.4 mg by mouth daily.  0     REVIEW OF  SYSTEMS: Cardiovascular: No chest pain, chest pressure, Pulmonary: No productive cough, asthma or wheezing. Neurologic: No weakness, paresthesias, aphasia, or amaurosis. No dizziness. Hematologic: No bleeding problems or clotting disorders. Musculoskeletal: No joint pain or joint swelling. Gastrointestinal: No blood in stool or hematemesis Genitourinary: No dysuria or hematuria. Psychiatric:: tearful, depressed Integumentary: No rashes or ulcers. Constitutional: No fever or chills.  PHYSICAL EXAMINATION: General: The patient appears their stated age.  Vital signs are BP  124/89 (BP Location: Right Arm)   Pulse 97   Temp 98.4 F (36.9 C) (Oral)   Resp 16   Ht 5' 5.5" (1.664 m)   Wt 188 lb 15 oz (85.7 kg)   SpO2 97%   BMI 30.96 kg/m  Pulmonary: Respirations are non-labored HEENT:  No gross abnormalities Abdomen: Soft and non-tender  Musculoskeletal: There are no major deformities.   Neurologic: No focal weakness or paresthesias are detected, Skin: There are no ulcer or rashes noted. Psychiatric: The patient has normal affect. Cardiovascular: Tno thrill in left arm BVT.  Mild aneurysmal changes in fistula   Assessment:  Clotted left arm BVT Plan: The patient is very tearful today and does not want surgery.  He just wants a catheter and to go home.  I think he would be a candidate for attempt at thrombolysis.  I also feel he would be a candidate for surgical declot, however he is not interested in that currently.  Therefore, I would recommend placing a permanent catheter and consider thrombolysis as an outpatient.  This was discussed with renal.     Eldridge Abrahams, M.D. [facility] Office: [phone] Pager:  [phone]

## 2015-10-23 NOTE — Progress Notes (Signed)
Pt has left to go to Radiology to have his HD Cath placed. Radiology called and they are consenting pt downstairs.

## 2015-10-23 NOTE — Discharge Summary (Signed)
Physician Discharge Summary  Patient ID: Reason Helzer MRN: [id number] DOB/AGE: [birth year] 50 y.o.  Admit date: 10/22/2015 Discharge date: 10/23/2015  Admission Diagnoses:  Discharge Diagnoses:  Active Problems:   Essential hypertension   Complication of AV dialysis fistula   ESRD on dialysis [facility])   Discharged Condition: Good  Hospital Course: HPI: 50 yo with hx of HTN, COPD, cirrhosis and ESRD on HD for 9 years.  He had initially a LUA AVF that worked for 7 yrs, then a RUA access that worked for about 1 year, then in July 2016 had new second [place] AVF that has worked well until today he came in w know flow / bruit in the fistula.  Was referred to IR for declot but his K was 6.9, so procedure not done and a temp cath was placed by IR and I was asked to admit pt for hyperkalemia and dialysis.  1) Clotted AV fistula - IR was not able to declot the AV fistula due to aneursymal degeneration.  Seen by [facility] who recommended addressing perm access as outpatient at a later date.  IR placed tunneled HD cath on day of dc and pt dc'd  Home.  Pt will f/u w Vasc surgery as OP regarding perm access.   2)  Hyperkalemia - had temp HD cath placed for dialylsis on day of admission and had HD w resolution of high K.    3)  ESRD - pt is to resume OP HD on Wed using new TDC.    4)  Cirrhosis - compensated  5)  COPD - not symptomatic  Consults: IR and [facility] surgery  Treatments: hemodialysis, placement of temp HD cath, removal of temp HD cath, placement of tunneled HD cath  Discharge Exam: Blood pressure 124/89, pulse 97, temperature 98.4 F (36.9 C), temperature source Oral, resp. rate 16, height 5' 5.5" (1.664 m), weight 85.7 kg (188 lb 15 oz), SpO2 97 %. Alert, no distress No rash, cyanosis or gangrene Sclera anicteric, throat clear Chest CTA bilat RRR no mrg Abd soft ntnd no mass/ ascites Ext no edema LE's LUA AVF - no bruit NF, ox 3  Disposition: 01-Home or Self Care     Medication  List    TAKE these medications   ALPRAZolam 1 MG tablet Commonly known as:  XANAX Take 1 mg by mouth at bedtime as needed for anxiety.   atorvastatin 20 MG tablet Commonly known as:  LIPITOR Take 1 tablet (20 mg total) by mouth daily.   cloNIDine 0.3 mg/24hr patch Commonly known as:  CATAPRES - Dosed in mg/24 hr Place 1 patch onto the skin once a week. Saturday.   esomeprazole 40 MG capsule Commonly known as:  NEXIUM Take 40-80 mg by mouth daily as needed (reflux/indigestion. Dose varies depending on severity.).   lisinopril 40 MG tablet Commonly known as:  PRINIVIL,ZESTRIL Take 40 mg by mouth at bedtime.   metoprolol succinate 50 MG 24 hr tablet Commonly known as:  TOPROL-XL Take 1 tablet (50 mg total) by mouth daily. Take with or immediately following a meal.   sevelamer carbonate 800 MG tablet Commonly known as:  RENVELA Take 800 mg by mouth 3 (three) times daily with meals. Takes 5-6 pills with a big meal, 3-4 pills with small meals   tamsulosin 0.4 MG Caps capsule Commonly known as:  FLOMAX Take 0.4 mg by mouth daily.        SignedSol Blazing 10/23/2015, 6:33 PM

## 2015-10-23 NOTE — Sedation Documentation (Signed)
Patient is resting comfortably. 

## 2015-10-23 NOTE — Progress Notes (Signed)
James Wall to be D/C'd Home per MD order.  Discussed prescriptions and follow up appointments with the patient. Prescriptions given to patient, medication list explained in detail. Pt verbalized understanding.    Medication List    TAKE these medications   ALPRAZolam 1 MG tablet Commonly known as:  XANAX Take 1 mg by mouth at bedtime as needed for anxiety.   atorvastatin 20 MG tablet Commonly known as:  LIPITOR Take 1 tablet (20 mg total) by mouth daily.   cloNIDine 0.3 mg/24hr patch Commonly known as:  CATAPRES - Dosed in mg/24 hr Place 1 patch onto the skin once a week. Saturday.   esomeprazole 40 MG capsule Commonly known as:  NEXIUM Take 40-80 mg by mouth daily as needed (reflux/indigestion. Dose varies depending on severity.).   lisinopril 40 MG tablet Commonly known as:  PRINIVIL,ZESTRIL Take 40 mg by mouth at bedtime.   metoprolol succinate 50 MG 24 hr tablet Commonly known as:  TOPROL-XL Take 1 tablet (50 mg total) by mouth daily. Take with or immediately following a meal.   sevelamer carbonate 800 MG tablet Commonly known as:  RENVELA Take 800 mg by mouth 3 (three) times daily with meals. Takes 5-6 pills with a big meal, 3-4 pills with small meals   tamsulosin 0.4 MG Caps capsule Commonly known as:  FLOMAX Take 0.4 mg by mouth daily.       Vitals:   10/23/15 1632 10/23/15 1728  BP: 127/87 124/89  Pulse: 95 97  Resp: 15 16  Temp:  98.4 F (36.9 C)    Skin clean, dry and intact without evidence of skin break down, no evidence of skin tears noted. IV catheter discontinued intact. Site without signs and symptoms of complications. Dressing and pressure applied. Pt denies pain at this time. No complaints noted.  An After Visit Summary was printed and given to the patient. Patient escorted via [place], and D/C home via private auto.  Haywood Lasso BSN, RN [facility] Phone [phone]

## 2015-10-23 NOTE — Sedation Documentation (Signed)
Pt  tolerated procedure well.

## 2015-10-23 NOTE — Sedation Documentation (Signed)
Vital signs stable. 

## 2015-10-23 NOTE — Progress Notes (Addendum)
[facility] Progress Note   Subjective: "When are they going to fix my fistula?" No C/O. Uncomfortable with RIJ temp HD catheter.   Objective Vitals:   10/22/15 2017 10/23/15 0420 10/23/15 0436 10/23/15 0800  BP: 102/77 (!) 81/45 (!) 100/59 (!) 131/58  Pulse: 79 62  64  Resp: 19 19    Temp: 98.1 F (36.7 C) 98.8 F (37.1 C)  98.5 F (36.9 C)  TempSrc: Oral Oral  Oral  SpO2: 100% 97%  98%  Weight: 85.7 kg (188 lb 15 oz)     Height:       Physical Exam General: Pleasant, alert, NAD Heart: S1,S2, No M/R/G Lungs: BBS CTA A/P Abdomen: Soft nontender Extremities: active BS Dialysis Access: RIJ temp cath, LUA AVF No bruit.   Dialysis Orders: MWF [facility]  3.5h  450/800 81kg   2/2 bath  Hep 4000 / 1000 prn  Venofer 50/wk Last Hb 12.3, no ESA  Additional Objective Labs: Basic Metabolic Panel:  Recent Labs Lab 10/22/15 0802  NA 139  K 6.8*  CL 96*  CO2 24  GLUCOSE 88  BUN 77*  CREATININE 23.05*  CALCIUM 9.0   Liver Function Tests: No results for input(s): AST, ALT, ALKPHOS, BILITOT, PROT, ALBUMIN in the last 168 hours. No results for input(s): LIPASE, AMYLASE in the last 168 hours. CBC:  Recent Labs Lab 10/22/15 0802  WBC 7.8  HGB 11.8*  HCT 35.9*  MCV 104.7*  PLT 261   Blood Culture    Component Value Date/Time   SDES URINE, RANDOM 07/09/2013 0902   SPECREQUEST NONE 07/09/2013 0902   CULT  07/08/2013 1810    STAPHYLOCOCCUS SPECIES (COAGULASE NEGATIVE) Note: THE SIGNIFICANCE OF ISOLATING THIS ORGANISM FROM A SINGLE SET OF BLOOD CULTURES WHEN MULTIPLE SETS ARE DRAWN IS UNCERTAIN. PLEASE NOTIFY THE MICROBIOLOGY DEPARTMENT WITHIN ONE WEEK IF SPECIATION AND SENSITIVITIES ARE REQUIRED. Note: Gram Stain Report Called to,Read Back By and Verified With: ERIN SMITH 07/09/13 @ 5:53PM BY RUSCOE A. Performed at [facility] 07/10/2013 FINAL 07/09/2013 0902    Cardiac Enzymes: No results for input(s): CKTOTAL, CKMB, CKMBINDEX,  TROPONINI in the last 168 hours. CBG: No results for input(s): GLUCAP in the last 168 hours. Iron Studies: No results for input(s): IRON, TIBC, TRANSFERRIN, FERRITIN in the last 72 hours. @lablastinr3 @ Studies/Results: Ir Fluoro Guide Cv Line Right  Result Date: 10/22/2015 CLINICAL DATA:  Clotted left upper arm dialysis AV fistula and need for emergent hemodialysis with potassium level of 6.8. EXAM: NON-TUNNELED CENTRAL VENOUS CATHETER PLACEMENT WITH ULTRASOUND AND FLUOROSCOPIC GUIDANCE FLUOROSCOPY TIME:  12 seconds. PROCEDURE: The procedure, risks, benefits, and alternatives were explained to the patient. Questions regarding the procedure were encouraged and answered. The patient understands and consents to the procedure. A time-out was performed prior to the procedure. The right neck and chest were prepped with chlorhexidine in a sterile fashion, and a sterile drape was applied covering the operative field. Maximum barrier sterile technique with sterile gowns and gloves were used for the procedure. Local anesthesia was provided with 1% lidocaine. Ultrasound was used to confirm patency of the right internal jugular vein. After creating a small venotomy incision, a 21 gauge needle was advanced into the right internal jugular vein under direct, real-time ultrasound guidance. Ultrasound image documentation was performed. After securing guidewire access, access was dilated over a guidewire. A 20 cm triple lumen temporary dialysis catheter was advanced over the wire. Catheter placement was confirmed by fluoroscopy. Final catheter positioning  was confirmed and documented with a fluoroscopic spot image. The catheter was aspirated and flushed with saline. The catheter exit site was secured with 0-Prolene retention sutures. COMPLICATIONS: None. FINDINGS: After catheter placement, the tip lies in the right atrium. The catheter aspirates normally and is ready for immediate use. IMPRESSION: Placement of non-tunneled  central venous catheter via the right internal jugular vein. The catheter tip lies in the right atrium. The catheter is ready for immediate use. Electronically Signed   By: Aletta Edouard M.D.   On: 10/22/2015 10:19   Ir US Guide Vasc Access Right  Result Date: 10/22/2015 CLINICAL DATA:  Clotted left upper arm dialysis AV fistula and need for emergent hemodialysis with potassium level of 6.8. EXAM: NON-TUNNELED CENTRAL VENOUS CATHETER PLACEMENT WITH ULTRASOUND AND FLUOROSCOPIC GUIDANCE FLUOROSCOPY TIME:  12 seconds. PROCEDURE: The procedure, risks, benefits, and alternatives were explained to the patient. Questions regarding the procedure were encouraged and answered. The patient understands and consents to the procedure. A time-out was performed prior to the procedure. The right neck and chest were prepped with chlorhexidine in a sterile fashion, and a sterile drape was applied covering the operative field. Maximum barrier sterile technique with sterile gowns and gloves were used for the procedure. Local anesthesia was provided with 1% lidocaine. Ultrasound was used to confirm patency of the right internal jugular vein. After creating a small venotomy incision, a 21 gauge needle was advanced into the right internal jugular vein under direct, real-time ultrasound guidance. Ultrasound image documentation was performed. After securing guidewire access, access was dilated over a guidewire. A 20 cm triple lumen temporary dialysis catheter was advanced over the wire. Catheter placement was confirmed by fluoroscopy. Final catheter positioning was confirmed and documented with a fluoroscopic spot image. The catheter was aspirated and flushed with saline. The catheter exit site was secured with 0-Prolene retention sutures. COMPLICATIONS: None. FINDINGS: After catheter placement, the tip lies in the right atrium. The catheter aspirates normally and is ready for immediate use. IMPRESSION: Placement of non-tunneled  central venous catheter via the right internal jugular vein. The catheter tip lies in the right atrium. The catheter is ready for immediate use. Electronically Signed   By: Aletta Edouard M.D.   On: 10/22/2015 10:19   Medications:   . alteplase  2 mg Intracatheter Once  . atorvastatin  20 mg Oral q1800  . [START ON 10/27/2015] cloNIDine  0.3 mg Transdermal Q Sat  . lisinopril  40 mg Oral QHS  . metoprolol succinate  50 mg Oral Daily  . sevelamer carbonate  800 mg Oral TID WC  . sodium chloride flush  3 mL Intravenous Q12H  . sodium chloride flush  3 mL Intravenous Q12H  . tamsulosin  0.4 mg Oral Daily     Assessment/Plan: 1. Hyperkalmia: K+ 6.8. HD yesterday after temp cath done. HD 3 hours 20 minutes. Post K+ not rechecked. Check renal panel today.  2. ESRD -MWF [facility]. Next HD tomorrow.  3. Anemia - HGB 11.8. No OP ESA. Give weekly Fe.  4. Secondary hyperparathyroidism - cont binders/sensipar. Ca 9.0 Renal profile today.  5. HTN/volume - BP variable but overall controlled. Clonidine transderm, metoprolol, lisinopril. . Check standing wt. HD yesterday pre wt 85.1 Net UF 2934 Post wt 79.9 slightly under EDW.. No edema.1-2 UFG liters tomorrow.  6. Nutrition - NPO at present. Renal diet when able to eat. 7. Clotted AVF Access: for Declot per IR today.   Rita H. Brown NP-C 10/23/2015, 10:55 AM  [facility] [phone]  Pt seen, examined and agree w A/P as above. IR unable to perform declot today due to aneursymal segments. Vasc surgery has been asked to see the pt.  Kelly Splinter MD [employer] pager [phone]    cell [phone] 10/23/2015, 1:56 PM

## 2015-10-23 NOTE — Progress Notes (Signed)
Pt refused AM labs. Nurse Tech reported BP of 81/45 automatically.. Rechecked BP manually at 100/59. Pt is asymptomatic. Will continue to monitor. Isac Caddy, RN

## 2015-10-24 DIAGNOSIS — E119 Type 2 diabetes mellitus without complications: Secondary | ICD-10-CM | POA: Diagnosis not present

## 2015-10-24 DIAGNOSIS — Z992 Dependence on renal dialysis: Secondary | ICD-10-CM | POA: Diagnosis not present

## 2015-10-24 DIAGNOSIS — D689 Coagulation defect, unspecified: Secondary | ICD-10-CM | POA: Diagnosis not present

## 2015-10-24 DIAGNOSIS — D631 Anemia in chronic kidney disease: Secondary | ICD-10-CM | POA: Diagnosis not present

## 2015-10-24 DIAGNOSIS — N186 End stage renal disease: Secondary | ICD-10-CM | POA: Diagnosis not present

## 2015-10-24 DIAGNOSIS — D509 Iron deficiency anemia, unspecified: Secondary | ICD-10-CM | POA: Diagnosis not present

## 2015-10-26 DIAGNOSIS — N186 End stage renal disease: Secondary | ICD-10-CM | POA: Diagnosis not present

## 2015-10-26 DIAGNOSIS — E119 Type 2 diabetes mellitus without complications: Secondary | ICD-10-CM | POA: Diagnosis not present

## 2015-10-26 DIAGNOSIS — D509 Iron deficiency anemia, unspecified: Secondary | ICD-10-CM | POA: Diagnosis not present

## 2015-10-26 DIAGNOSIS — D689 Coagulation defect, unspecified: Secondary | ICD-10-CM | POA: Diagnosis not present

## 2015-10-26 DIAGNOSIS — D631 Anemia in chronic kidney disease: Secondary | ICD-10-CM | POA: Diagnosis not present

## 2015-10-26 DIAGNOSIS — Z992 Dependence on renal dialysis: Secondary | ICD-10-CM | POA: Diagnosis not present

## 2015-10-29 DIAGNOSIS — D631 Anemia in chronic kidney disease: Secondary | ICD-10-CM | POA: Diagnosis not present

## 2015-10-29 DIAGNOSIS — D509 Iron deficiency anemia, unspecified: Secondary | ICD-10-CM | POA: Diagnosis not present

## 2015-10-29 DIAGNOSIS — D689 Coagulation defect, unspecified: Secondary | ICD-10-CM | POA: Diagnosis not present

## 2015-10-29 DIAGNOSIS — Z992 Dependence on renal dialysis: Secondary | ICD-10-CM | POA: Diagnosis not present

## 2015-10-29 DIAGNOSIS — E119 Type 2 diabetes mellitus without complications: Secondary | ICD-10-CM | POA: Diagnosis not present

## 2015-10-29 DIAGNOSIS — N186 End stage renal disease: Secondary | ICD-10-CM | POA: Diagnosis not present

## 2015-10-30 DIAGNOSIS — L299 Pruritus, unspecified: Secondary | ICD-10-CM | POA: Diagnosis not present

## 2015-10-30 DIAGNOSIS — F418 Other specified anxiety disorders: Secondary | ICD-10-CM | POA: Diagnosis not present

## 2015-10-30 DIAGNOSIS — J449 Chronic obstructive pulmonary disease, unspecified: Secondary | ICD-10-CM | POA: Diagnosis not present

## 2015-10-30 DIAGNOSIS — I12 Hypertensive chronic kidney disease with 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: Secondary | ICD-10-CM | POA: Diagnosis not present

## 2015-10-30 DIAGNOSIS — N400 Enlarged prostate without lower urinary tract symptoms: Secondary | ICD-10-CM | POA: Diagnosis not present

## 2015-10-30 DIAGNOSIS — G4700 Insomnia, unspecified: Secondary | ICD-10-CM | POA: Diagnosis not present

## 2015-10-30 DIAGNOSIS — E785 Hyperlipidemia, unspecified: Secondary | ICD-10-CM | POA: Diagnosis not present

## 2015-10-31 DIAGNOSIS — D509 Iron deficiency anemia, unspecified: Secondary | ICD-10-CM | POA: Diagnosis not present

## 2015-10-31 DIAGNOSIS — Z992 Dependence on renal dialysis: Secondary | ICD-10-CM | POA: Diagnosis not present

## 2015-10-31 DIAGNOSIS — N186 End stage renal disease: Secondary | ICD-10-CM | POA: Diagnosis not present

## 2015-10-31 DIAGNOSIS — D689 Coagulation defect, unspecified: Secondary | ICD-10-CM | POA: Diagnosis not present

## 2015-10-31 DIAGNOSIS — D631 Anemia in chronic kidney disease: Secondary | ICD-10-CM | POA: Diagnosis not present

## 2015-10-31 DIAGNOSIS — E119 Type 2 diabetes mellitus without complications: Secondary | ICD-10-CM | POA: Diagnosis not present

## 2015-11-02 DIAGNOSIS — D689 Coagulation defect, unspecified: Secondary | ICD-10-CM | POA: Diagnosis not present

## 2015-11-02 DIAGNOSIS — N186 End stage renal disease: Secondary | ICD-10-CM | POA: Diagnosis not present

## 2015-11-02 DIAGNOSIS — Z992 Dependence on renal dialysis: Secondary | ICD-10-CM | POA: Diagnosis not present

## 2015-11-02 DIAGNOSIS — E119 Type 2 diabetes mellitus without complications: Secondary | ICD-10-CM | POA: Diagnosis not present

## 2015-11-02 DIAGNOSIS — D631 Anemia in chronic kidney disease: Secondary | ICD-10-CM | POA: Diagnosis not present

## 2015-11-02 DIAGNOSIS — D509 Iron deficiency anemia, unspecified: Secondary | ICD-10-CM | POA: Diagnosis not present

## 2015-11-03 DIAGNOSIS — N186 End stage renal disease: Secondary | ICD-10-CM | POA: Diagnosis not present

## 2015-11-03 DIAGNOSIS — E875 Hyperkalemia: Secondary | ICD-10-CM | POA: Diagnosis not present

## 2015-11-03 DIAGNOSIS — Z992 Dependence on renal dialysis: Secondary | ICD-10-CM | POA: Diagnosis not present

## 2015-11-05 DIAGNOSIS — D509 Iron deficiency anemia, unspecified: Secondary | ICD-10-CM | POA: Diagnosis not present

## 2015-11-05 DIAGNOSIS — N186 End stage renal disease: Secondary | ICD-10-CM | POA: Diagnosis not present

## 2015-11-05 DIAGNOSIS — N2581 Secondary hyperparathyroidism of renal origin: Secondary | ICD-10-CM | POA: Diagnosis not present

## 2015-11-05 DIAGNOSIS — D631 Anemia in chronic kidney disease: Secondary | ICD-10-CM | POA: Diagnosis not present

## 2015-11-07 DIAGNOSIS — D631 Anemia in chronic kidney disease: Secondary | ICD-10-CM | POA: Diagnosis not present

## 2015-11-07 DIAGNOSIS — N186 End stage renal disease: Secondary | ICD-10-CM | POA: Diagnosis not present

## 2015-11-07 DIAGNOSIS — D509 Iron deficiency anemia, unspecified: Secondary | ICD-10-CM | POA: Diagnosis not present

## 2015-11-07 DIAGNOSIS — N2581 Secondary hyperparathyroidism of renal origin: Secondary | ICD-10-CM | POA: Diagnosis not present

## 2015-11-09 DIAGNOSIS — D509 Iron deficiency anemia, unspecified: Secondary | ICD-10-CM | POA: Diagnosis not present

## 2015-11-09 DIAGNOSIS — D631 Anemia in chronic kidney disease: Secondary | ICD-10-CM | POA: Diagnosis not present

## 2015-11-09 DIAGNOSIS — N186 End stage renal disease: Secondary | ICD-10-CM | POA: Diagnosis not present

## 2015-11-09 DIAGNOSIS — N2581 Secondary hyperparathyroidism of renal origin: Secondary | ICD-10-CM | POA: Diagnosis not present

## 2015-11-12 DIAGNOSIS — D509 Iron deficiency anemia, unspecified: Secondary | ICD-10-CM | POA: Diagnosis not present

## 2015-11-12 DIAGNOSIS — D631 Anemia in chronic kidney disease: Secondary | ICD-10-CM | POA: Diagnosis not present

## 2015-11-12 DIAGNOSIS — N2581 Secondary hyperparathyroidism of renal origin: Secondary | ICD-10-CM | POA: Diagnosis not present

## 2015-11-12 DIAGNOSIS — N186 End stage renal disease: Secondary | ICD-10-CM | POA: Diagnosis not present

## 2015-11-13 ENCOUNTER — Other Ambulatory Visit: Payer: Self-pay

## 2015-11-13 DIAGNOSIS — N186 End stage renal disease: Secondary | ICD-10-CM

## 2015-11-13 DIAGNOSIS — Z01810 Encounter for preprocedural cardiovascular examination: Secondary | ICD-10-CM

## 2015-11-14 DIAGNOSIS — N186 End stage renal disease: Secondary | ICD-10-CM | POA: Diagnosis not present

## 2015-11-14 DIAGNOSIS — D509 Iron deficiency anemia, unspecified: Secondary | ICD-10-CM | POA: Diagnosis not present

## 2015-11-14 DIAGNOSIS — D631 Anemia in chronic kidney disease: Secondary | ICD-10-CM | POA: Diagnosis not present

## 2015-11-14 DIAGNOSIS — N2581 Secondary hyperparathyroidism of renal origin: Secondary | ICD-10-CM | POA: Diagnosis not present

## 2015-11-16 DIAGNOSIS — D509 Iron deficiency anemia, unspecified: Secondary | ICD-10-CM | POA: Diagnosis not present

## 2015-11-16 DIAGNOSIS — D631 Anemia in chronic kidney disease: Secondary | ICD-10-CM | POA: Diagnosis not present

## 2015-11-16 DIAGNOSIS — N2581 Secondary hyperparathyroidism of renal origin: Secondary | ICD-10-CM | POA: Diagnosis not present

## 2015-11-16 DIAGNOSIS — N186 End stage renal disease: Secondary | ICD-10-CM | POA: Diagnosis not present

## 2015-11-19 DIAGNOSIS — N2581 Secondary hyperparathyroidism of renal origin: Secondary | ICD-10-CM | POA: Diagnosis not present

## 2015-11-19 DIAGNOSIS — D631 Anemia in chronic kidney disease: Secondary | ICD-10-CM | POA: Diagnosis not present

## 2015-11-19 DIAGNOSIS — N186 End stage renal disease: Secondary | ICD-10-CM | POA: Diagnosis not present

## 2015-11-19 DIAGNOSIS — D509 Iron deficiency anemia, unspecified: Secondary | ICD-10-CM | POA: Diagnosis not present

## 2015-11-21 DIAGNOSIS — N2581 Secondary hyperparathyroidism of renal origin: Secondary | ICD-10-CM | POA: Diagnosis not present

## 2015-11-21 DIAGNOSIS — D631 Anemia in chronic kidney disease: Secondary | ICD-10-CM | POA: Diagnosis not present

## 2015-11-21 DIAGNOSIS — N186 End stage renal disease: Secondary | ICD-10-CM | POA: Diagnosis not present

## 2015-11-21 DIAGNOSIS — D509 Iron deficiency anemia, unspecified: Secondary | ICD-10-CM | POA: Diagnosis not present

## 2015-11-23 DIAGNOSIS — N186 End stage renal disease: Secondary | ICD-10-CM | POA: Diagnosis not present

## 2015-11-23 DIAGNOSIS — N2581 Secondary hyperparathyroidism of renal origin: Secondary | ICD-10-CM | POA: Diagnosis not present

## 2015-11-23 DIAGNOSIS — D631 Anemia in chronic kidney disease: Secondary | ICD-10-CM | POA: Diagnosis not present

## 2015-11-23 DIAGNOSIS — D509 Iron deficiency anemia, unspecified: Secondary | ICD-10-CM | POA: Diagnosis not present

## 2015-11-26 DIAGNOSIS — N2581 Secondary hyperparathyroidism of renal origin: Secondary | ICD-10-CM | POA: Diagnosis not present

## 2015-11-26 DIAGNOSIS — N186 End stage renal disease: Secondary | ICD-10-CM | POA: Diagnosis not present

## 2015-11-26 DIAGNOSIS — D631 Anemia in chronic kidney disease: Secondary | ICD-10-CM | POA: Diagnosis not present

## 2015-11-26 DIAGNOSIS — D509 Iron deficiency anemia, unspecified: Secondary | ICD-10-CM | POA: Diagnosis not present

## 2015-11-28 DIAGNOSIS — D509 Iron deficiency anemia, unspecified: Secondary | ICD-10-CM | POA: Diagnosis not present

## 2015-11-28 DIAGNOSIS — N186 End stage renal disease: Secondary | ICD-10-CM | POA: Diagnosis not present

## 2015-11-28 DIAGNOSIS — D631 Anemia in chronic kidney disease: Secondary | ICD-10-CM | POA: Diagnosis not present

## 2015-11-28 DIAGNOSIS — N2581 Secondary hyperparathyroidism of renal origin: Secondary | ICD-10-CM | POA: Diagnosis not present

## 2015-11-30 DIAGNOSIS — N2581 Secondary hyperparathyroidism of renal origin: Secondary | ICD-10-CM | POA: Diagnosis not present

## 2015-11-30 DIAGNOSIS — D509 Iron deficiency anemia, unspecified: Secondary | ICD-10-CM | POA: Diagnosis not present

## 2015-11-30 DIAGNOSIS — N186 End stage renal disease: Secondary | ICD-10-CM | POA: Diagnosis not present

## 2015-11-30 DIAGNOSIS — D631 Anemia in chronic kidney disease: Secondary | ICD-10-CM | POA: Diagnosis not present

## 2015-12-03 DIAGNOSIS — N186 End stage renal disease: Secondary | ICD-10-CM | POA: Diagnosis not present

## 2015-12-03 DIAGNOSIS — D631 Anemia in chronic kidney disease: Secondary | ICD-10-CM | POA: Diagnosis not present

## 2015-12-03 DIAGNOSIS — D509 Iron deficiency anemia, unspecified: Secondary | ICD-10-CM | POA: Diagnosis not present

## 2015-12-03 DIAGNOSIS — N2581 Secondary hyperparathyroidism of renal origin: Secondary | ICD-10-CM | POA: Diagnosis not present

## 2015-12-04 DIAGNOSIS — E875 Hyperkalemia: Secondary | ICD-10-CM | POA: Diagnosis not present

## 2015-12-04 DIAGNOSIS — N186 End stage renal disease: Secondary | ICD-10-CM | POA: Diagnosis not present

## 2015-12-04 DIAGNOSIS — Z992 Dependence on renal dialysis: Secondary | ICD-10-CM | POA: Diagnosis not present

## 2015-12-05 ENCOUNTER — Encounter: Payer: Self-pay | Admitting: Vascular Surgery

## 2015-12-05 DIAGNOSIS — N186 End stage renal disease: Secondary | ICD-10-CM | POA: Diagnosis not present

## 2015-12-05 DIAGNOSIS — E119 Type 2 diabetes mellitus without complications: Secondary | ICD-10-CM | POA: Diagnosis not present

## 2015-12-05 DIAGNOSIS — D631 Anemia in chronic kidney disease: Secondary | ICD-10-CM | POA: Diagnosis not present

## 2015-12-05 DIAGNOSIS — D509 Iron deficiency anemia, unspecified: Secondary | ICD-10-CM | POA: Diagnosis not present

## 2015-12-05 DIAGNOSIS — Z992 Dependence on renal dialysis: Secondary | ICD-10-CM | POA: Diagnosis not present

## 2015-12-05 DIAGNOSIS — N2581 Secondary hyperparathyroidism of renal origin: Secondary | ICD-10-CM | POA: Diagnosis not present

## 2015-12-06 ENCOUNTER — Ambulatory Visit (INDEPENDENT_AMBULATORY_CARE_PROVIDER_SITE_OTHER): Payer: Medicare Other | Admitting: Vascular Surgery

## 2015-12-06 ENCOUNTER — Ambulatory Visit (HOSPITAL_COMMUNITY)
Admission: RE | Admit: 2015-12-06 | Discharge: 2015-12-06 | Disposition: A | Discharge status: Home / Self Care | Payer: Medicare Other | Source: Ambulatory Visit | Attending: Vascular Surgery | Admitting: Vascular Surgery

## 2015-12-06 ENCOUNTER — Ambulatory Visit (INDEPENDENT_AMBULATORY_CARE_PROVIDER_SITE_OTHER)
Admission: RE | Admit: 2015-12-06 | Discharge: 2015-12-06 | Disposition: A | Discharge status: Home / Self Care | Payer: Medicare Other | Source: Ambulatory Visit | Attending: Vascular Surgery | Admitting: Vascular Surgery

## 2015-12-06 VITALS — BP 112/74 | HR 84 | Temp 97.9°F | Resp 16 | Ht 65.0 in | Wt 185.0 lb

## 2015-12-06 DIAGNOSIS — Z01810 Encounter for preprocedural cardiovascular examination: Secondary | ICD-10-CM

## 2015-12-06 DIAGNOSIS — N186 End stage renal disease: Secondary | ICD-10-CM

## 2015-12-06 DIAGNOSIS — Z01818 Encounter for other preprocedural examination: Secondary | ICD-10-CM | POA: Diagnosis not present

## 2015-12-06 DIAGNOSIS — Z992 Dependence on renal dialysis: Secondary | ICD-10-CM

## 2015-12-06 NOTE — Progress Notes (Signed)
Patient is a 50 year old male referred for placement of a new hemodialysis access. The patient has previously had a left radiocephalic left brachiocephalic left basilic vein transposition and right brachiocephalic AV fistula. All of these are occluded. He currently has a tunneled dialysis catheter. He states he is not currently interested in any other hemodialysis access procedures. He states he is considering stopping dialysis. He is depressed over the recent loss of his mother and his current situation related to dialysis in general as well as multiple failed access procedures.  Physical exam:  Vitals:   12/06/15 1606  BP: 112/74  Pulse: 84  Resp: 16  Temp: 97.9 F (36.6 C)  TempSrc: Oral  SpO2: 100%  Weight: 185 lb (83.9 kg)  Height: 5' 5"  (1.651 m)    Extremities: 2+ brachial pulse 2+ radial pulse bilaterally  Psychiatric: The patient has a depressed mood and is tearful during the office visit today. He does not express any suicidal ideation.  Assessment: The patient currently does not want any further hemodialysis access procedures. He may be open to consideration of this in the future.  He was quite depressed at our office visit today. He has consented to an outpatient visit with behavioral health. He will follow-up with [place] on as-needed basis.  Ruta Hinds, MD [facility] Office: [phone] Pager: [phone]

## 2015-12-07 DIAGNOSIS — D509 Iron deficiency anemia, unspecified: Secondary | ICD-10-CM | POA: Diagnosis not present

## 2015-12-07 DIAGNOSIS — N186 End stage renal disease: Secondary | ICD-10-CM | POA: Diagnosis not present

## 2015-12-07 DIAGNOSIS — Z992 Dependence on renal dialysis: Secondary | ICD-10-CM | POA: Diagnosis not present

## 2015-12-07 DIAGNOSIS — D631 Anemia in chronic kidney disease: Secondary | ICD-10-CM | POA: Diagnosis not present

## 2015-12-07 DIAGNOSIS — E119 Type 2 diabetes mellitus without complications: Secondary | ICD-10-CM | POA: Diagnosis not present

## 2015-12-07 DIAGNOSIS — N2581 Secondary hyperparathyroidism of renal origin: Secondary | ICD-10-CM | POA: Diagnosis not present

## 2015-12-10 DIAGNOSIS — D631 Anemia in chronic kidney disease: Secondary | ICD-10-CM | POA: Diagnosis not present

## 2015-12-10 DIAGNOSIS — N2581 Secondary hyperparathyroidism of renal origin: Secondary | ICD-10-CM | POA: Diagnosis not present

## 2015-12-10 DIAGNOSIS — N186 End stage renal disease: Secondary | ICD-10-CM | POA: Diagnosis not present

## 2015-12-10 DIAGNOSIS — D509 Iron deficiency anemia, unspecified: Secondary | ICD-10-CM | POA: Diagnosis not present

## 2015-12-10 DIAGNOSIS — Z992 Dependence on renal dialysis: Secondary | ICD-10-CM | POA: Diagnosis not present

## 2015-12-10 DIAGNOSIS — E119 Type 2 diabetes mellitus without complications: Secondary | ICD-10-CM | POA: Diagnosis not present

## 2015-12-12 DIAGNOSIS — D509 Iron deficiency anemia, unspecified: Secondary | ICD-10-CM | POA: Diagnosis not present

## 2015-12-12 DIAGNOSIS — D631 Anemia in chronic kidney disease: Secondary | ICD-10-CM | POA: Diagnosis not present

## 2015-12-12 DIAGNOSIS — E119 Type 2 diabetes mellitus without complications: Secondary | ICD-10-CM | POA: Diagnosis not present

## 2015-12-12 DIAGNOSIS — N186 End stage renal disease: Secondary | ICD-10-CM | POA: Diagnosis not present

## 2015-12-12 DIAGNOSIS — N2581 Secondary hyperparathyroidism of renal origin: Secondary | ICD-10-CM | POA: Diagnosis not present

## 2015-12-12 DIAGNOSIS — Z992 Dependence on renal dialysis: Secondary | ICD-10-CM | POA: Diagnosis not present

## 2015-12-14 DIAGNOSIS — N2581 Secondary hyperparathyroidism of renal origin: Secondary | ICD-10-CM | POA: Diagnosis not present

## 2015-12-14 DIAGNOSIS — Z992 Dependence on renal dialysis: Secondary | ICD-10-CM | POA: Diagnosis not present

## 2015-12-14 DIAGNOSIS — D509 Iron deficiency anemia, unspecified: Secondary | ICD-10-CM | POA: Diagnosis not present

## 2015-12-14 DIAGNOSIS — D631 Anemia in chronic kidney disease: Secondary | ICD-10-CM | POA: Diagnosis not present

## 2015-12-14 DIAGNOSIS — N186 End stage renal disease: Secondary | ICD-10-CM | POA: Diagnosis not present

## 2015-12-14 DIAGNOSIS — E119 Type 2 diabetes mellitus without complications: Secondary | ICD-10-CM | POA: Diagnosis not present

## 2015-12-17 DIAGNOSIS — D631 Anemia in chronic kidney disease: Secondary | ICD-10-CM | POA: Diagnosis not present

## 2015-12-17 DIAGNOSIS — D509 Iron deficiency anemia, unspecified: Secondary | ICD-10-CM | POA: Diagnosis not present

## 2015-12-17 DIAGNOSIS — N2581 Secondary hyperparathyroidism of renal origin: Secondary | ICD-10-CM | POA: Diagnosis not present

## 2015-12-17 DIAGNOSIS — N186 End stage renal disease: Secondary | ICD-10-CM | POA: Diagnosis not present

## 2015-12-17 DIAGNOSIS — Z992 Dependence on renal dialysis: Secondary | ICD-10-CM | POA: Diagnosis not present

## 2015-12-17 DIAGNOSIS — E119 Type 2 diabetes mellitus without complications: Secondary | ICD-10-CM | POA: Diagnosis not present

## 2015-12-19 DIAGNOSIS — Z992 Dependence on renal dialysis: Secondary | ICD-10-CM | POA: Diagnosis not present

## 2015-12-19 DIAGNOSIS — N186 End stage renal disease: Secondary | ICD-10-CM | POA: Diagnosis not present

## 2015-12-19 DIAGNOSIS — D631 Anemia in chronic kidney disease: Secondary | ICD-10-CM | POA: Diagnosis not present

## 2015-12-19 DIAGNOSIS — E119 Type 2 diabetes mellitus without complications: Secondary | ICD-10-CM | POA: Diagnosis not present

## 2015-12-19 DIAGNOSIS — D509 Iron deficiency anemia, unspecified: Secondary | ICD-10-CM | POA: Diagnosis not present

## 2015-12-19 DIAGNOSIS — N2581 Secondary hyperparathyroidism of renal origin: Secondary | ICD-10-CM | POA: Diagnosis not present

## 2015-12-21 DIAGNOSIS — N2581 Secondary hyperparathyroidism of renal origin: Secondary | ICD-10-CM | POA: Diagnosis not present

## 2015-12-21 DIAGNOSIS — D631 Anemia in chronic kidney disease: Secondary | ICD-10-CM | POA: Diagnosis not present

## 2015-12-21 DIAGNOSIS — D509 Iron deficiency anemia, unspecified: Secondary | ICD-10-CM | POA: Diagnosis not present

## 2015-12-21 DIAGNOSIS — E119 Type 2 diabetes mellitus without complications: Secondary | ICD-10-CM | POA: Diagnosis not present

## 2015-12-21 DIAGNOSIS — N186 End stage renal disease: Secondary | ICD-10-CM | POA: Diagnosis not present

## 2015-12-21 DIAGNOSIS — Z992 Dependence on renal dialysis: Secondary | ICD-10-CM | POA: Diagnosis not present

## 2015-12-23 DIAGNOSIS — D631 Anemia in chronic kidney disease: Secondary | ICD-10-CM | POA: Diagnosis not present

## 2015-12-23 DIAGNOSIS — N186 End stage renal disease: Secondary | ICD-10-CM | POA: Diagnosis not present

## 2015-12-23 DIAGNOSIS — N2581 Secondary hyperparathyroidism of renal origin: Secondary | ICD-10-CM | POA: Diagnosis not present

## 2015-12-23 DIAGNOSIS — E119 Type 2 diabetes mellitus without complications: Secondary | ICD-10-CM | POA: Diagnosis not present

## 2015-12-23 DIAGNOSIS — D509 Iron deficiency anemia, unspecified: Secondary | ICD-10-CM | POA: Diagnosis not present

## 2015-12-23 DIAGNOSIS — Z992 Dependence on renal dialysis: Secondary | ICD-10-CM | POA: Diagnosis not present

## 2015-12-25 DIAGNOSIS — N2581 Secondary hyperparathyroidism of renal origin: Secondary | ICD-10-CM | POA: Diagnosis not present

## 2015-12-25 DIAGNOSIS — D509 Iron deficiency anemia, unspecified: Secondary | ICD-10-CM | POA: Diagnosis not present

## 2015-12-25 DIAGNOSIS — E119 Type 2 diabetes mellitus without complications: Secondary | ICD-10-CM | POA: Diagnosis not present

## 2015-12-25 DIAGNOSIS — D631 Anemia in chronic kidney disease: Secondary | ICD-10-CM | POA: Diagnosis not present

## 2015-12-25 DIAGNOSIS — Z992 Dependence on renal dialysis: Secondary | ICD-10-CM | POA: Diagnosis not present

## 2015-12-25 DIAGNOSIS — N186 End stage renal disease: Secondary | ICD-10-CM | POA: Diagnosis not present

## 2015-12-28 DIAGNOSIS — E119 Type 2 diabetes mellitus without complications: Secondary | ICD-10-CM | POA: Diagnosis not present

## 2015-12-28 DIAGNOSIS — D631 Anemia in chronic kidney disease: Secondary | ICD-10-CM | POA: Diagnosis not present

## 2015-12-28 DIAGNOSIS — N186 End stage renal disease: Secondary | ICD-10-CM | POA: Diagnosis not present

## 2015-12-28 DIAGNOSIS — N2581 Secondary hyperparathyroidism of renal origin: Secondary | ICD-10-CM | POA: Diagnosis not present

## 2015-12-28 DIAGNOSIS — Z992 Dependence on renal dialysis: Secondary | ICD-10-CM | POA: Diagnosis not present

## 2015-12-28 DIAGNOSIS — D509 Iron deficiency anemia, unspecified: Secondary | ICD-10-CM | POA: Diagnosis not present

## 2015-12-31 DIAGNOSIS — N186 End stage renal disease: Secondary | ICD-10-CM | POA: Diagnosis not present

## 2015-12-31 DIAGNOSIS — D509 Iron deficiency anemia, unspecified: Secondary | ICD-10-CM | POA: Diagnosis not present

## 2015-12-31 DIAGNOSIS — D631 Anemia in chronic kidney disease: Secondary | ICD-10-CM | POA: Diagnosis not present

## 2015-12-31 DIAGNOSIS — N2581 Secondary hyperparathyroidism of renal origin: Secondary | ICD-10-CM | POA: Diagnosis not present

## 2015-12-31 DIAGNOSIS — E119 Type 2 diabetes mellitus without complications: Secondary | ICD-10-CM | POA: Diagnosis not present

## 2015-12-31 DIAGNOSIS — Z992 Dependence on renal dialysis: Secondary | ICD-10-CM | POA: Diagnosis not present

## 2016-01-02 DIAGNOSIS — D509 Iron deficiency anemia, unspecified: Secondary | ICD-10-CM | POA: Diagnosis not present

## 2016-01-02 DIAGNOSIS — N186 End stage renal disease: Secondary | ICD-10-CM | POA: Diagnosis not present

## 2016-01-02 DIAGNOSIS — Z992 Dependence on renal dialysis: Secondary | ICD-10-CM | POA: Diagnosis not present

## 2016-01-02 DIAGNOSIS — D631 Anemia in chronic kidney disease: Secondary | ICD-10-CM | POA: Diagnosis not present

## 2016-01-02 DIAGNOSIS — E119 Type 2 diabetes mellitus without complications: Secondary | ICD-10-CM | POA: Diagnosis not present

## 2016-01-02 DIAGNOSIS — N2581 Secondary hyperparathyroidism of renal origin: Secondary | ICD-10-CM | POA: Diagnosis not present

## 2016-01-03 DIAGNOSIS — I12 Hypertensive chronic kidney disease with 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: Secondary | ICD-10-CM | POA: Diagnosis not present

## 2016-01-03 DIAGNOSIS — N186 End stage renal disease: Secondary | ICD-10-CM | POA: Diagnosis not present

## 2016-01-03 DIAGNOSIS — Z992 Dependence on renal dialysis: Secondary | ICD-10-CM | POA: Diagnosis not present

## 2016-01-04 DIAGNOSIS — D631 Anemia in chronic kidney disease: Secondary | ICD-10-CM | POA: Diagnosis not present

## 2016-01-04 DIAGNOSIS — N2581 Secondary hyperparathyroidism of renal origin: Secondary | ICD-10-CM | POA: Diagnosis not present

## 2016-01-04 DIAGNOSIS — D509 Iron deficiency anemia, unspecified: Secondary | ICD-10-CM | POA: Diagnosis not present

## 2016-01-04 DIAGNOSIS — N186 End stage renal disease: Secondary | ICD-10-CM | POA: Diagnosis not present

## 2016-01-04 DIAGNOSIS — Z992 Dependence on renal dialysis: Secondary | ICD-10-CM | POA: Diagnosis not present

## 2016-01-07 DIAGNOSIS — N186 End stage renal disease: Secondary | ICD-10-CM | POA: Diagnosis not present

## 2016-01-07 DIAGNOSIS — N2581 Secondary hyperparathyroidism of renal origin: Secondary | ICD-10-CM | POA: Diagnosis not present

## 2016-01-09 DIAGNOSIS — N2581 Secondary hyperparathyroidism of renal origin: Secondary | ICD-10-CM | POA: Diagnosis not present

## 2016-01-09 DIAGNOSIS — N186 End stage renal disease: Secondary | ICD-10-CM | POA: Diagnosis not present

## 2016-01-11 DIAGNOSIS — N186 End stage renal disease: Secondary | ICD-10-CM | POA: Diagnosis not present

## 2016-01-11 DIAGNOSIS — N2581 Secondary hyperparathyroidism of renal origin: Secondary | ICD-10-CM | POA: Diagnosis not present

## 2016-01-14 DIAGNOSIS — D509 Iron deficiency anemia, unspecified: Secondary | ICD-10-CM | POA: Diagnosis not present

## 2016-01-14 DIAGNOSIS — Z992 Dependence on renal dialysis: Secondary | ICD-10-CM | POA: Diagnosis not present

## 2016-01-14 DIAGNOSIS — N186 End stage renal disease: Secondary | ICD-10-CM | POA: Diagnosis not present

## 2016-01-14 DIAGNOSIS — N2581 Secondary hyperparathyroidism of renal origin: Secondary | ICD-10-CM | POA: Diagnosis not present

## 2016-01-14 DIAGNOSIS — D631 Anemia in chronic kidney disease: Secondary | ICD-10-CM | POA: Diagnosis not present

## 2016-01-16 DIAGNOSIS — N186 End stage renal disease: Secondary | ICD-10-CM | POA: Diagnosis not present

## 2016-01-16 DIAGNOSIS — D509 Iron deficiency anemia, unspecified: Secondary | ICD-10-CM | POA: Diagnosis not present

## 2016-01-16 DIAGNOSIS — N2581 Secondary hyperparathyroidism of renal origin: Secondary | ICD-10-CM | POA: Diagnosis not present

## 2016-01-16 DIAGNOSIS — Z992 Dependence on renal dialysis: Secondary | ICD-10-CM | POA: Diagnosis not present

## 2016-01-16 DIAGNOSIS — D631 Anemia in chronic kidney disease: Secondary | ICD-10-CM | POA: Diagnosis not present

## 2016-01-18 DIAGNOSIS — Z992 Dependence on renal dialysis: Secondary | ICD-10-CM | POA: Diagnosis not present

## 2016-01-18 DIAGNOSIS — D509 Iron deficiency anemia, unspecified: Secondary | ICD-10-CM | POA: Diagnosis not present

## 2016-01-18 DIAGNOSIS — D631 Anemia in chronic kidney disease: Secondary | ICD-10-CM | POA: Diagnosis not present

## 2016-01-18 DIAGNOSIS — N2581 Secondary hyperparathyroidism of renal origin: Secondary | ICD-10-CM | POA: Diagnosis not present

## 2016-01-18 DIAGNOSIS — N186 End stage renal disease: Secondary | ICD-10-CM | POA: Diagnosis not present

## 2016-01-21 DIAGNOSIS — N186 End stage renal disease: Secondary | ICD-10-CM | POA: Diagnosis not present

## 2016-01-21 DIAGNOSIS — D509 Iron deficiency anemia, unspecified: Secondary | ICD-10-CM | POA: Diagnosis not present

## 2016-01-21 DIAGNOSIS — N2581 Secondary hyperparathyroidism of renal origin: Secondary | ICD-10-CM | POA: Diagnosis not present

## 2016-01-21 DIAGNOSIS — D631 Anemia in chronic kidney disease: Secondary | ICD-10-CM | POA: Diagnosis not present

## 2016-01-21 DIAGNOSIS — Z992 Dependence on renal dialysis: Secondary | ICD-10-CM | POA: Diagnosis not present

## 2016-01-23 DIAGNOSIS — Z992 Dependence on renal dialysis: Secondary | ICD-10-CM | POA: Diagnosis not present

## 2016-01-23 DIAGNOSIS — N2581 Secondary hyperparathyroidism of renal origin: Secondary | ICD-10-CM | POA: Diagnosis not present

## 2016-01-23 DIAGNOSIS — D509 Iron deficiency anemia, unspecified: Secondary | ICD-10-CM | POA: Diagnosis not present

## 2016-01-23 DIAGNOSIS — D631 Anemia in chronic kidney disease: Secondary | ICD-10-CM | POA: Diagnosis not present

## 2016-01-23 DIAGNOSIS — N186 End stage renal disease: Secondary | ICD-10-CM | POA: Diagnosis not present

## 2016-01-25 DIAGNOSIS — D631 Anemia in chronic kidney disease: Secondary | ICD-10-CM | POA: Diagnosis not present

## 2016-01-25 DIAGNOSIS — N186 End stage renal disease: Secondary | ICD-10-CM | POA: Diagnosis not present

## 2016-01-25 DIAGNOSIS — Z992 Dependence on renal dialysis: Secondary | ICD-10-CM | POA: Diagnosis not present

## 2016-01-25 DIAGNOSIS — D509 Iron deficiency anemia, unspecified: Secondary | ICD-10-CM | POA: Diagnosis not present

## 2016-01-25 DIAGNOSIS — N2581 Secondary hyperparathyroidism of renal origin: Secondary | ICD-10-CM | POA: Diagnosis not present

## 2016-01-30 DIAGNOSIS — N2581 Secondary hyperparathyroidism of renal origin: Secondary | ICD-10-CM | POA: Diagnosis not present

## 2016-01-30 DIAGNOSIS — D509 Iron deficiency anemia, unspecified: Secondary | ICD-10-CM | POA: Diagnosis not present

## 2016-01-30 DIAGNOSIS — N186 End stage renal disease: Secondary | ICD-10-CM | POA: Diagnosis not present

## 2016-01-30 DIAGNOSIS — D631 Anemia in chronic kidney disease: Secondary | ICD-10-CM | POA: Diagnosis not present

## 2016-01-30 DIAGNOSIS — Z992 Dependence on renal dialysis: Secondary | ICD-10-CM | POA: Diagnosis not present

## 2016-02-01 DIAGNOSIS — N2581 Secondary hyperparathyroidism of renal origin: Secondary | ICD-10-CM | POA: Diagnosis not present

## 2016-02-01 DIAGNOSIS — D631 Anemia in chronic kidney disease: Secondary | ICD-10-CM | POA: Diagnosis not present

## 2016-02-01 DIAGNOSIS — N186 End stage renal disease: Secondary | ICD-10-CM | POA: Diagnosis not present

## 2016-02-01 DIAGNOSIS — D509 Iron deficiency anemia, unspecified: Secondary | ICD-10-CM | POA: Diagnosis not present

## 2016-02-01 DIAGNOSIS — Z992 Dependence on renal dialysis: Secondary | ICD-10-CM | POA: Diagnosis not present

## 2016-02-03 DIAGNOSIS — I12 Hypertensive chronic kidney disease with 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: Secondary | ICD-10-CM | POA: Diagnosis not present

## 2016-02-03 DIAGNOSIS — N186 End stage renal disease: Secondary | ICD-10-CM | POA: Diagnosis not present

## 2016-02-03 DIAGNOSIS — Z992 Dependence on renal dialysis: Secondary | ICD-10-CM | POA: Diagnosis not present

## 2016-02-06 DIAGNOSIS — Z992 Dependence on renal dialysis: Secondary | ICD-10-CM | POA: Diagnosis not present

## 2016-02-06 DIAGNOSIS — N186 End stage renal disease: Secondary | ICD-10-CM | POA: Diagnosis not present

## 2016-02-06 DIAGNOSIS — N2581 Secondary hyperparathyroidism of renal origin: Secondary | ICD-10-CM | POA: Diagnosis not present

## 2016-02-06 DIAGNOSIS — D631 Anemia in chronic kidney disease: Secondary | ICD-10-CM | POA: Diagnosis not present

## 2016-02-06 DIAGNOSIS — D509 Iron deficiency anemia, unspecified: Secondary | ICD-10-CM | POA: Diagnosis not present

## 2016-02-08 DIAGNOSIS — D509 Iron deficiency anemia, unspecified: Secondary | ICD-10-CM | POA: Diagnosis not present

## 2016-02-08 DIAGNOSIS — N186 End stage renal disease: Secondary | ICD-10-CM | POA: Diagnosis not present

## 2016-02-08 DIAGNOSIS — N2581 Secondary hyperparathyroidism of renal origin: Secondary | ICD-10-CM | POA: Diagnosis not present

## 2016-02-08 DIAGNOSIS — D631 Anemia in chronic kidney disease: Secondary | ICD-10-CM | POA: Diagnosis not present

## 2016-02-08 DIAGNOSIS — Z992 Dependence on renal dialysis: Secondary | ICD-10-CM | POA: Diagnosis not present

## 2016-02-11 DIAGNOSIS — N186 End stage renal disease: Secondary | ICD-10-CM | POA: Diagnosis not present

## 2016-02-11 DIAGNOSIS — D631 Anemia in chronic kidney disease: Secondary | ICD-10-CM | POA: Diagnosis not present

## 2016-02-11 DIAGNOSIS — N2581 Secondary hyperparathyroidism of renal origin: Secondary | ICD-10-CM | POA: Diagnosis not present

## 2016-02-11 DIAGNOSIS — D509 Iron deficiency anemia, unspecified: Secondary | ICD-10-CM | POA: Diagnosis not present

## 2016-02-11 DIAGNOSIS — Z992 Dependence on renal dialysis: Secondary | ICD-10-CM | POA: Diagnosis not present

## 2016-02-13 DIAGNOSIS — N2581 Secondary hyperparathyroidism of renal origin: Secondary | ICD-10-CM | POA: Diagnosis not present

## 2016-02-13 DIAGNOSIS — Z992 Dependence on renal dialysis: Secondary | ICD-10-CM | POA: Diagnosis not present

## 2016-02-13 DIAGNOSIS — N186 End stage renal disease: Secondary | ICD-10-CM | POA: Diagnosis not present

## 2016-02-13 DIAGNOSIS — D631 Anemia in chronic kidney disease: Secondary | ICD-10-CM | POA: Diagnosis not present

## 2016-02-13 DIAGNOSIS — D509 Iron deficiency anemia, unspecified: Secondary | ICD-10-CM | POA: Diagnosis not present

## 2016-02-15 DIAGNOSIS — D509 Iron deficiency anemia, unspecified: Secondary | ICD-10-CM | POA: Diagnosis not present

## 2016-02-15 DIAGNOSIS — N186 End stage renal disease: Secondary | ICD-10-CM | POA: Diagnosis not present

## 2016-02-15 DIAGNOSIS — Z992 Dependence on renal dialysis: Secondary | ICD-10-CM | POA: Diagnosis not present

## 2016-02-15 DIAGNOSIS — D631 Anemia in chronic kidney disease: Secondary | ICD-10-CM | POA: Diagnosis not present

## 2016-02-15 DIAGNOSIS — N2581 Secondary hyperparathyroidism of renal origin: Secondary | ICD-10-CM | POA: Diagnosis not present

## 2016-02-18 DIAGNOSIS — N2581 Secondary hyperparathyroidism of renal origin: Secondary | ICD-10-CM | POA: Diagnosis not present

## 2016-02-18 DIAGNOSIS — Z992 Dependence on renal dialysis: Secondary | ICD-10-CM | POA: Diagnosis not present

## 2016-02-18 DIAGNOSIS — D509 Iron deficiency anemia, unspecified: Secondary | ICD-10-CM | POA: Diagnosis not present

## 2016-02-18 DIAGNOSIS — D631 Anemia in chronic kidney disease: Secondary | ICD-10-CM | POA: Diagnosis not present

## 2016-02-18 DIAGNOSIS — N186 End stage renal disease: Secondary | ICD-10-CM | POA: Diagnosis not present

## 2016-02-20 DIAGNOSIS — D631 Anemia in chronic kidney disease: Secondary | ICD-10-CM | POA: Diagnosis not present

## 2016-02-20 DIAGNOSIS — D509 Iron deficiency anemia, unspecified: Secondary | ICD-10-CM | POA: Diagnosis not present

## 2016-02-20 DIAGNOSIS — Z992 Dependence on renal dialysis: Secondary | ICD-10-CM | POA: Diagnosis not present

## 2016-02-20 DIAGNOSIS — N186 End stage renal disease: Secondary | ICD-10-CM | POA: Diagnosis not present

## 2016-02-20 DIAGNOSIS — N2581 Secondary hyperparathyroidism of renal origin: Secondary | ICD-10-CM | POA: Diagnosis not present

## 2016-02-22 DIAGNOSIS — Z992 Dependence on renal dialysis: Secondary | ICD-10-CM | POA: Diagnosis not present

## 2016-02-22 DIAGNOSIS — N2581 Secondary hyperparathyroidism of renal origin: Secondary | ICD-10-CM | POA: Diagnosis not present

## 2016-02-22 DIAGNOSIS — D509 Iron deficiency anemia, unspecified: Secondary | ICD-10-CM | POA: Diagnosis not present

## 2016-02-22 DIAGNOSIS — D631 Anemia in chronic kidney disease: Secondary | ICD-10-CM | POA: Diagnosis not present

## 2016-02-22 DIAGNOSIS — N186 End stage renal disease: Secondary | ICD-10-CM | POA: Diagnosis not present

## 2016-02-25 DIAGNOSIS — D509 Iron deficiency anemia, unspecified: Secondary | ICD-10-CM | POA: Diagnosis not present

## 2016-02-25 DIAGNOSIS — D631 Anemia in chronic kidney disease: Secondary | ICD-10-CM | POA: Diagnosis not present

## 2016-02-25 DIAGNOSIS — N186 End stage renal disease: Secondary | ICD-10-CM | POA: Diagnosis not present

## 2016-02-25 DIAGNOSIS — Z992 Dependence on renal dialysis: Secondary | ICD-10-CM | POA: Diagnosis not present

## 2016-02-25 DIAGNOSIS — N2581 Secondary hyperparathyroidism of renal origin: Secondary | ICD-10-CM | POA: Diagnosis not present

## 2016-02-27 DIAGNOSIS — Z992 Dependence on renal dialysis: Secondary | ICD-10-CM | POA: Diagnosis not present

## 2016-02-27 DIAGNOSIS — N2581 Secondary hyperparathyroidism of renal origin: Secondary | ICD-10-CM | POA: Diagnosis not present

## 2016-02-27 DIAGNOSIS — N186 End stage renal disease: Secondary | ICD-10-CM | POA: Diagnosis not present

## 2016-02-27 DIAGNOSIS — D509 Iron deficiency anemia, unspecified: Secondary | ICD-10-CM | POA: Diagnosis not present

## 2016-02-27 DIAGNOSIS — D631 Anemia in chronic kidney disease: Secondary | ICD-10-CM | POA: Diagnosis not present

## 2016-02-29 DIAGNOSIS — N186 End stage renal disease: Secondary | ICD-10-CM | POA: Diagnosis not present

## 2016-02-29 DIAGNOSIS — D631 Anemia in chronic kidney disease: Secondary | ICD-10-CM | POA: Diagnosis not present

## 2016-02-29 DIAGNOSIS — N2581 Secondary hyperparathyroidism of renal origin: Secondary | ICD-10-CM | POA: Diagnosis not present

## 2016-02-29 DIAGNOSIS — D509 Iron deficiency anemia, unspecified: Secondary | ICD-10-CM | POA: Diagnosis not present

## 2016-02-29 DIAGNOSIS — Z992 Dependence on renal dialysis: Secondary | ICD-10-CM | POA: Diagnosis not present

## 2016-03-03 DIAGNOSIS — N186 End stage renal disease: Secondary | ICD-10-CM | POA: Diagnosis not present

## 2016-03-03 DIAGNOSIS — N2581 Secondary hyperparathyroidism of renal origin: Secondary | ICD-10-CM | POA: Diagnosis not present

## 2016-03-03 DIAGNOSIS — D631 Anemia in chronic kidney disease: Secondary | ICD-10-CM | POA: Diagnosis not present

## 2016-03-03 DIAGNOSIS — Z992 Dependence on renal dialysis: Secondary | ICD-10-CM | POA: Diagnosis not present

## 2016-03-03 DIAGNOSIS — D509 Iron deficiency anemia, unspecified: Secondary | ICD-10-CM | POA: Diagnosis not present

## 2016-03-05 DIAGNOSIS — N186 End stage renal disease: Secondary | ICD-10-CM | POA: Diagnosis not present

## 2016-03-05 DIAGNOSIS — D631 Anemia in chronic kidney disease: Secondary | ICD-10-CM | POA: Diagnosis not present

## 2016-03-05 DIAGNOSIS — N2581 Secondary hyperparathyroidism of renal origin: Secondary | ICD-10-CM | POA: Diagnosis not present

## 2016-03-05 DIAGNOSIS — D509 Iron deficiency anemia, unspecified: Secondary | ICD-10-CM | POA: Diagnosis not present

## 2016-03-05 DIAGNOSIS — Z992 Dependence on renal dialysis: Secondary | ICD-10-CM | POA: Diagnosis not present

## 2016-03-05 DIAGNOSIS — I12 Hypertensive chronic kidney disease with 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: Secondary | ICD-10-CM | POA: Diagnosis not present

## 2016-03-07 DIAGNOSIS — D509 Iron deficiency anemia, unspecified: Secondary | ICD-10-CM | POA: Diagnosis not present

## 2016-03-07 DIAGNOSIS — Z992 Dependence on renal dialysis: Secondary | ICD-10-CM | POA: Diagnosis not present

## 2016-03-07 DIAGNOSIS — N2581 Secondary hyperparathyroidism of renal origin: Secondary | ICD-10-CM | POA: Diagnosis not present

## 2016-03-07 DIAGNOSIS — N186 End stage renal disease: Secondary | ICD-10-CM | POA: Diagnosis not present

## 2016-03-07 DIAGNOSIS — D631 Anemia in chronic kidney disease: Secondary | ICD-10-CM | POA: Diagnosis not present

## 2016-03-10 DIAGNOSIS — D509 Iron deficiency anemia, unspecified: Secondary | ICD-10-CM | POA: Diagnosis not present

## 2016-03-10 DIAGNOSIS — D631 Anemia in chronic kidney disease: Secondary | ICD-10-CM | POA: Diagnosis not present

## 2016-03-10 DIAGNOSIS — N2581 Secondary hyperparathyroidism of renal origin: Secondary | ICD-10-CM | POA: Diagnosis not present

## 2016-03-10 DIAGNOSIS — N186 End stage renal disease: Secondary | ICD-10-CM | POA: Diagnosis not present

## 2016-03-10 DIAGNOSIS — Z992 Dependence on renal dialysis: Secondary | ICD-10-CM | POA: Diagnosis not present

## 2016-03-12 DIAGNOSIS — D509 Iron deficiency anemia, unspecified: Secondary | ICD-10-CM | POA: Diagnosis not present

## 2016-03-12 DIAGNOSIS — N186 End stage renal disease: Secondary | ICD-10-CM | POA: Diagnosis not present

## 2016-03-12 DIAGNOSIS — N2581 Secondary hyperparathyroidism of renal origin: Secondary | ICD-10-CM | POA: Diagnosis not present

## 2016-03-12 DIAGNOSIS — D631 Anemia in chronic kidney disease: Secondary | ICD-10-CM | POA: Diagnosis not present

## 2016-03-12 DIAGNOSIS — Z992 Dependence on renal dialysis: Secondary | ICD-10-CM | POA: Diagnosis not present

## 2016-03-14 DIAGNOSIS — D631 Anemia in chronic kidney disease: Secondary | ICD-10-CM | POA: Diagnosis not present

## 2016-03-14 DIAGNOSIS — D509 Iron deficiency anemia, unspecified: Secondary | ICD-10-CM | POA: Diagnosis not present

## 2016-03-14 DIAGNOSIS — Z992 Dependence on renal dialysis: Secondary | ICD-10-CM | POA: Diagnosis not present

## 2016-03-14 DIAGNOSIS — N186 End stage renal disease: Secondary | ICD-10-CM | POA: Diagnosis not present

## 2016-03-14 DIAGNOSIS — N2581 Secondary hyperparathyroidism of renal origin: Secondary | ICD-10-CM | POA: Diagnosis not present

## 2016-03-17 DIAGNOSIS — N2581 Secondary hyperparathyroidism of renal origin: Secondary | ICD-10-CM | POA: Diagnosis not present

## 2016-03-17 DIAGNOSIS — Z992 Dependence on renal dialysis: Secondary | ICD-10-CM | POA: Diagnosis not present

## 2016-03-17 DIAGNOSIS — D631 Anemia in chronic kidney disease: Secondary | ICD-10-CM | POA: Diagnosis not present

## 2016-03-17 DIAGNOSIS — N186 End stage renal disease: Secondary | ICD-10-CM | POA: Diagnosis not present

## 2016-03-17 DIAGNOSIS — D509 Iron deficiency anemia, unspecified: Secondary | ICD-10-CM | POA: Diagnosis not present

## 2016-03-19 DIAGNOSIS — D631 Anemia in chronic kidney disease: Secondary | ICD-10-CM | POA: Diagnosis not present

## 2016-03-19 DIAGNOSIS — N186 End stage renal disease: Secondary | ICD-10-CM | POA: Diagnosis not present

## 2016-03-19 DIAGNOSIS — D509 Iron deficiency anemia, unspecified: Secondary | ICD-10-CM | POA: Diagnosis not present

## 2016-03-19 DIAGNOSIS — N2581 Secondary hyperparathyroidism of renal origin: Secondary | ICD-10-CM | POA: Diagnosis not present

## 2016-03-19 DIAGNOSIS — Z992 Dependence on renal dialysis: Secondary | ICD-10-CM | POA: Diagnosis not present

## 2016-03-21 DIAGNOSIS — Z992 Dependence on renal dialysis: Secondary | ICD-10-CM | POA: Diagnosis not present

## 2016-03-21 DIAGNOSIS — D509 Iron deficiency anemia, unspecified: Secondary | ICD-10-CM | POA: Diagnosis not present

## 2016-03-21 DIAGNOSIS — N186 End stage renal disease: Secondary | ICD-10-CM | POA: Diagnosis not present

## 2016-03-21 DIAGNOSIS — D631 Anemia in chronic kidney disease: Secondary | ICD-10-CM | POA: Diagnosis not present

## 2016-03-21 DIAGNOSIS — N2581 Secondary hyperparathyroidism of renal origin: Secondary | ICD-10-CM | POA: Diagnosis not present

## 2016-03-24 DIAGNOSIS — D631 Anemia in chronic kidney disease: Secondary | ICD-10-CM | POA: Diagnosis not present

## 2016-03-24 DIAGNOSIS — N186 End stage renal disease: Secondary | ICD-10-CM | POA: Diagnosis not present

## 2016-03-24 DIAGNOSIS — Z992 Dependence on renal dialysis: Secondary | ICD-10-CM | POA: Diagnosis not present

## 2016-03-24 DIAGNOSIS — D509 Iron deficiency anemia, unspecified: Secondary | ICD-10-CM | POA: Diagnosis not present

## 2016-03-24 DIAGNOSIS — N2581 Secondary hyperparathyroidism of renal origin: Secondary | ICD-10-CM | POA: Diagnosis not present

## 2016-03-26 DIAGNOSIS — D509 Iron deficiency anemia, unspecified: Secondary | ICD-10-CM | POA: Diagnosis not present

## 2016-03-26 DIAGNOSIS — D631 Anemia in chronic kidney disease: Secondary | ICD-10-CM | POA: Diagnosis not present

## 2016-03-26 DIAGNOSIS — N186 End stage renal disease: Secondary | ICD-10-CM | POA: Diagnosis not present

## 2016-03-26 DIAGNOSIS — Z992 Dependence on renal dialysis: Secondary | ICD-10-CM | POA: Diagnosis not present

## 2016-03-26 DIAGNOSIS — N2581 Secondary hyperparathyroidism of renal origin: Secondary | ICD-10-CM | POA: Diagnosis not present

## 2016-03-28 DIAGNOSIS — N2581 Secondary hyperparathyroidism of renal origin: Secondary | ICD-10-CM | POA: Diagnosis not present

## 2016-03-28 DIAGNOSIS — Z992 Dependence on renal dialysis: Secondary | ICD-10-CM | POA: Diagnosis not present

## 2016-03-28 DIAGNOSIS — D631 Anemia in chronic kidney disease: Secondary | ICD-10-CM | POA: Diagnosis not present

## 2016-03-28 DIAGNOSIS — D509 Iron deficiency anemia, unspecified: Secondary | ICD-10-CM | POA: Diagnosis not present

## 2016-03-28 DIAGNOSIS — N186 End stage renal disease: Secondary | ICD-10-CM | POA: Diagnosis not present

## 2016-03-31 DIAGNOSIS — D509 Iron deficiency anemia, unspecified: Secondary | ICD-10-CM | POA: Diagnosis not present

## 2016-03-31 DIAGNOSIS — N186 End stage renal disease: Secondary | ICD-10-CM | POA: Diagnosis not present

## 2016-03-31 DIAGNOSIS — Z992 Dependence on renal dialysis: Secondary | ICD-10-CM | POA: Diagnosis not present

## 2016-03-31 DIAGNOSIS — D631 Anemia in chronic kidney disease: Secondary | ICD-10-CM | POA: Diagnosis not present

## 2016-03-31 DIAGNOSIS — N2581 Secondary hyperparathyroidism of renal origin: Secondary | ICD-10-CM | POA: Diagnosis not present

## 2016-04-02 DIAGNOSIS — N186 End stage renal disease: Secondary | ICD-10-CM | POA: Diagnosis not present

## 2016-04-02 DIAGNOSIS — I12 Hypertensive chronic kidney disease with 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: Secondary | ICD-10-CM | POA: Diagnosis not present

## 2016-04-02 DIAGNOSIS — Z992 Dependence on renal dialysis: Secondary | ICD-10-CM | POA: Diagnosis not present

## 2016-04-02 DIAGNOSIS — D509 Iron deficiency anemia, unspecified: Secondary | ICD-10-CM | POA: Diagnosis not present

## 2016-04-02 DIAGNOSIS — N2581 Secondary hyperparathyroidism of renal origin: Secondary | ICD-10-CM | POA: Diagnosis not present

## 2016-04-02 DIAGNOSIS — D631 Anemia in chronic kidney disease: Secondary | ICD-10-CM | POA: Diagnosis not present

## 2016-04-04 DIAGNOSIS — D509 Iron deficiency anemia, unspecified: Secondary | ICD-10-CM | POA: Diagnosis not present

## 2016-04-04 DIAGNOSIS — D631 Anemia in chronic kidney disease: Secondary | ICD-10-CM | POA: Diagnosis not present

## 2016-04-04 DIAGNOSIS — N2581 Secondary hyperparathyroidism of renal origin: Secondary | ICD-10-CM | POA: Diagnosis not present

## 2016-04-04 DIAGNOSIS — N186 End stage renal disease: Secondary | ICD-10-CM | POA: Diagnosis not present

## 2016-04-04 DIAGNOSIS — Z992 Dependence on renal dialysis: Secondary | ICD-10-CM | POA: Diagnosis not present

## 2016-04-07 DIAGNOSIS — Z992 Dependence on renal dialysis: Secondary | ICD-10-CM | POA: Diagnosis not present

## 2016-04-07 DIAGNOSIS — D509 Iron deficiency anemia, unspecified: Secondary | ICD-10-CM | POA: Diagnosis not present

## 2016-04-07 DIAGNOSIS — D631 Anemia in chronic kidney disease: Secondary | ICD-10-CM | POA: Diagnosis not present

## 2016-04-07 DIAGNOSIS — N186 End stage renal disease: Secondary | ICD-10-CM | POA: Diagnosis not present

## 2016-04-07 DIAGNOSIS — N2581 Secondary hyperparathyroidism of renal origin: Secondary | ICD-10-CM | POA: Diagnosis not present

## 2016-04-09 DIAGNOSIS — D509 Iron deficiency anemia, unspecified: Secondary | ICD-10-CM | POA: Diagnosis not present

## 2016-04-09 DIAGNOSIS — D631 Anemia in chronic kidney disease: Secondary | ICD-10-CM | POA: Diagnosis not present

## 2016-04-09 DIAGNOSIS — N2581 Secondary hyperparathyroidism of renal origin: Secondary | ICD-10-CM | POA: Diagnosis not present

## 2016-04-09 DIAGNOSIS — Z992 Dependence on renal dialysis: Secondary | ICD-10-CM | POA: Diagnosis not present

## 2016-04-09 DIAGNOSIS — N186 End stage renal disease: Secondary | ICD-10-CM | POA: Diagnosis not present

## 2016-04-11 DIAGNOSIS — N186 End stage renal disease: Secondary | ICD-10-CM | POA: Diagnosis not present

## 2016-04-11 DIAGNOSIS — Z992 Dependence on renal dialysis: Secondary | ICD-10-CM | POA: Diagnosis not present

## 2016-04-11 DIAGNOSIS — D631 Anemia in chronic kidney disease: Secondary | ICD-10-CM | POA: Diagnosis not present

## 2016-04-11 DIAGNOSIS — N2581 Secondary hyperparathyroidism of renal origin: Secondary | ICD-10-CM | POA: Diagnosis not present

## 2016-04-11 DIAGNOSIS — D509 Iron deficiency anemia, unspecified: Secondary | ICD-10-CM | POA: Diagnosis not present

## 2016-04-14 DIAGNOSIS — Z992 Dependence on renal dialysis: Secondary | ICD-10-CM | POA: Diagnosis not present

## 2016-04-14 DIAGNOSIS — N186 End stage renal disease: Secondary | ICD-10-CM | POA: Diagnosis not present

## 2016-04-14 DIAGNOSIS — D509 Iron deficiency anemia, unspecified: Secondary | ICD-10-CM | POA: Diagnosis not present

## 2016-04-14 DIAGNOSIS — D631 Anemia in chronic kidney disease: Secondary | ICD-10-CM | POA: Diagnosis not present

## 2016-04-14 DIAGNOSIS — N2581 Secondary hyperparathyroidism of renal origin: Secondary | ICD-10-CM | POA: Diagnosis not present

## 2016-04-16 DIAGNOSIS — Z992 Dependence on renal dialysis: Secondary | ICD-10-CM | POA: Diagnosis not present

## 2016-04-16 DIAGNOSIS — D631 Anemia in chronic kidney disease: Secondary | ICD-10-CM | POA: Diagnosis not present

## 2016-04-16 DIAGNOSIS — D509 Iron deficiency anemia, unspecified: Secondary | ICD-10-CM | POA: Diagnosis not present

## 2016-04-16 DIAGNOSIS — N186 End stage renal disease: Secondary | ICD-10-CM | POA: Diagnosis not present

## 2016-04-16 DIAGNOSIS — N2581 Secondary hyperparathyroidism of renal origin: Secondary | ICD-10-CM | POA: Diagnosis not present

## 2016-04-18 DIAGNOSIS — D631 Anemia in chronic kidney disease: Secondary | ICD-10-CM | POA: Diagnosis not present

## 2016-04-18 DIAGNOSIS — N2581 Secondary hyperparathyroidism of renal origin: Secondary | ICD-10-CM | POA: Diagnosis not present

## 2016-04-18 DIAGNOSIS — N186 End stage renal disease: Secondary | ICD-10-CM | POA: Diagnosis not present

## 2016-04-18 DIAGNOSIS — Z992 Dependence on renal dialysis: Secondary | ICD-10-CM | POA: Diagnosis not present

## 2016-04-18 DIAGNOSIS — D509 Iron deficiency anemia, unspecified: Secondary | ICD-10-CM | POA: Diagnosis not present

## 2016-04-21 DIAGNOSIS — Z992 Dependence on renal dialysis: Secondary | ICD-10-CM | POA: Diagnosis not present

## 2016-04-21 DIAGNOSIS — N2581 Secondary hyperparathyroidism of renal origin: Secondary | ICD-10-CM | POA: Diagnosis not present

## 2016-04-21 DIAGNOSIS — N186 End stage renal disease: Secondary | ICD-10-CM | POA: Diagnosis not present

## 2016-04-21 DIAGNOSIS — D509 Iron deficiency anemia, unspecified: Secondary | ICD-10-CM | POA: Diagnosis not present

## 2016-04-21 DIAGNOSIS — D631 Anemia in chronic kidney disease: Secondary | ICD-10-CM | POA: Diagnosis not present

## 2016-04-22 DIAGNOSIS — E785 Hyperlipidemia, unspecified: Secondary | ICD-10-CM | POA: Diagnosis not present

## 2016-04-22 DIAGNOSIS — Z1211 Encounter for screening for malignant neoplasm of colon: Secondary | ICD-10-CM | POA: Diagnosis not present

## 2016-04-22 DIAGNOSIS — Z992 Dependence on renal dialysis: Secondary | ICD-10-CM | POA: Diagnosis not present

## 2016-04-22 DIAGNOSIS — Z1389 Encounter for screening for other disorder: Secondary | ICD-10-CM | POA: Diagnosis not present

## 2016-04-22 DIAGNOSIS — N400 Enlarged prostate without lower urinary tract symptoms: Secondary | ICD-10-CM | POA: Diagnosis not present

## 2016-04-22 DIAGNOSIS — N186 End stage renal disease: Secondary | ICD-10-CM | POA: Diagnosis not present

## 2016-04-22 DIAGNOSIS — Z0000 Encounter for general adult medical examination without abnormal findings: Secondary | ICD-10-CM | POA: Diagnosis not present

## 2016-04-22 DIAGNOSIS — G4700 Insomnia, unspecified: Secondary | ICD-10-CM | POA: Diagnosis not present

## 2016-04-22 DIAGNOSIS — F418 Other specified anxiety disorders: Secondary | ICD-10-CM | POA: Diagnosis not present

## 2016-04-22 DIAGNOSIS — I12 Hypertensive chronic kidney disease with 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: Secondary | ICD-10-CM | POA: Diagnosis not present

## 2016-04-22 DIAGNOSIS — J449 Chronic obstructive pulmonary disease, unspecified: Secondary | ICD-10-CM | POA: Diagnosis not present

## 2016-04-23 DIAGNOSIS — D509 Iron deficiency anemia, unspecified: Secondary | ICD-10-CM | POA: Diagnosis not present

## 2016-04-23 DIAGNOSIS — Z992 Dependence on renal dialysis: Secondary | ICD-10-CM | POA: Diagnosis not present

## 2016-04-23 DIAGNOSIS — D631 Anemia in chronic kidney disease: Secondary | ICD-10-CM | POA: Diagnosis not present

## 2016-04-23 DIAGNOSIS — N2581 Secondary hyperparathyroidism of renal origin: Secondary | ICD-10-CM | POA: Diagnosis not present

## 2016-04-23 DIAGNOSIS — N186 End stage renal disease: Secondary | ICD-10-CM | POA: Diagnosis not present

## 2016-04-25 DIAGNOSIS — D509 Iron deficiency anemia, unspecified: Secondary | ICD-10-CM | POA: Diagnosis not present

## 2016-04-25 DIAGNOSIS — N186 End stage renal disease: Secondary | ICD-10-CM | POA: Diagnosis not present

## 2016-04-25 DIAGNOSIS — Z992 Dependence on renal dialysis: Secondary | ICD-10-CM | POA: Diagnosis not present

## 2016-04-25 DIAGNOSIS — N2581 Secondary hyperparathyroidism of renal origin: Secondary | ICD-10-CM | POA: Diagnosis not present

## 2016-04-25 DIAGNOSIS — D631 Anemia in chronic kidney disease: Secondary | ICD-10-CM | POA: Diagnosis not present

## 2016-04-28 DIAGNOSIS — D509 Iron deficiency anemia, unspecified: Secondary | ICD-10-CM | POA: Diagnosis not present

## 2016-04-28 DIAGNOSIS — D631 Anemia in chronic kidney disease: Secondary | ICD-10-CM | POA: Diagnosis not present

## 2016-04-28 DIAGNOSIS — N186 End stage renal disease: Secondary | ICD-10-CM | POA: Diagnosis not present

## 2016-04-28 DIAGNOSIS — N2581 Secondary hyperparathyroidism of renal origin: Secondary | ICD-10-CM | POA: Diagnosis not present

## 2016-04-28 DIAGNOSIS — Z992 Dependence on renal dialysis: Secondary | ICD-10-CM | POA: Diagnosis not present

## 2016-04-30 DIAGNOSIS — Z992 Dependence on renal dialysis: Secondary | ICD-10-CM | POA: Diagnosis not present

## 2016-04-30 DIAGNOSIS — N2581 Secondary hyperparathyroidism of renal origin: Secondary | ICD-10-CM | POA: Diagnosis not present

## 2016-04-30 DIAGNOSIS — D509 Iron deficiency anemia, unspecified: Secondary | ICD-10-CM | POA: Diagnosis not present

## 2016-04-30 DIAGNOSIS — N186 End stage renal disease: Secondary | ICD-10-CM | POA: Diagnosis not present

## 2016-04-30 DIAGNOSIS — D631 Anemia in chronic kidney disease: Secondary | ICD-10-CM | POA: Diagnosis not present

## 2016-05-03 DIAGNOSIS — Z992 Dependence on renal dialysis: Secondary | ICD-10-CM | POA: Diagnosis not present

## 2016-05-03 DIAGNOSIS — I12 Hypertensive chronic kidney disease with 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: Secondary | ICD-10-CM | POA: Diagnosis not present

## 2016-05-03 DIAGNOSIS — N186 End stage renal disease: Secondary | ICD-10-CM | POA: Diagnosis not present

## 2016-05-05 DIAGNOSIS — N2581 Secondary hyperparathyroidism of renal origin: Secondary | ICD-10-CM | POA: Diagnosis not present

## 2016-05-05 DIAGNOSIS — N186 End stage renal disease: Secondary | ICD-10-CM | POA: Diagnosis not present

## 2016-05-05 DIAGNOSIS — D631 Anemia in chronic kidney disease: Secondary | ICD-10-CM | POA: Diagnosis not present

## 2016-05-05 DIAGNOSIS — Z992 Dependence on renal dialysis: Secondary | ICD-10-CM | POA: Diagnosis not present

## 2016-05-05 DIAGNOSIS — D509 Iron deficiency anemia, unspecified: Secondary | ICD-10-CM | POA: Diagnosis not present

## 2016-05-07 DIAGNOSIS — D509 Iron deficiency anemia, unspecified: Secondary | ICD-10-CM | POA: Diagnosis not present

## 2016-05-07 DIAGNOSIS — N2581 Secondary hyperparathyroidism of renal origin: Secondary | ICD-10-CM | POA: Diagnosis not present

## 2016-05-07 DIAGNOSIS — D631 Anemia in chronic kidney disease: Secondary | ICD-10-CM | POA: Diagnosis not present

## 2016-05-07 DIAGNOSIS — N186 End stage renal disease: Secondary | ICD-10-CM | POA: Diagnosis not present

## 2016-05-07 DIAGNOSIS — Z992 Dependence on renal dialysis: Secondary | ICD-10-CM | POA: Diagnosis not present

## 2016-05-09 DIAGNOSIS — Z992 Dependence on renal dialysis: Secondary | ICD-10-CM | POA: Diagnosis not present

## 2016-05-09 DIAGNOSIS — N186 End stage renal disease: Secondary | ICD-10-CM | POA: Diagnosis not present

## 2016-05-09 DIAGNOSIS — N2581 Secondary hyperparathyroidism of renal origin: Secondary | ICD-10-CM | POA: Diagnosis not present

## 2016-05-09 DIAGNOSIS — D631 Anemia in chronic kidney disease: Secondary | ICD-10-CM | POA: Diagnosis not present

## 2016-05-09 DIAGNOSIS — D509 Iron deficiency anemia, unspecified: Secondary | ICD-10-CM | POA: Diagnosis not present

## 2016-05-12 DIAGNOSIS — D509 Iron deficiency anemia, unspecified: Secondary | ICD-10-CM | POA: Diagnosis not present

## 2016-05-12 DIAGNOSIS — N186 End stage renal disease: Secondary | ICD-10-CM | POA: Diagnosis not present

## 2016-05-12 DIAGNOSIS — N2581 Secondary hyperparathyroidism of renal origin: Secondary | ICD-10-CM | POA: Diagnosis not present

## 2016-05-12 DIAGNOSIS — D631 Anemia in chronic kidney disease: Secondary | ICD-10-CM | POA: Diagnosis not present

## 2016-05-12 DIAGNOSIS — Z992 Dependence on renal dialysis: Secondary | ICD-10-CM | POA: Diagnosis not present

## 2016-05-14 DIAGNOSIS — D509 Iron deficiency anemia, unspecified: Secondary | ICD-10-CM | POA: Diagnosis not present

## 2016-05-14 DIAGNOSIS — D631 Anemia in chronic kidney disease: Secondary | ICD-10-CM | POA: Diagnosis not present

## 2016-05-14 DIAGNOSIS — N186 End stage renal disease: Secondary | ICD-10-CM | POA: Diagnosis not present

## 2016-05-14 DIAGNOSIS — N2581 Secondary hyperparathyroidism of renal origin: Secondary | ICD-10-CM | POA: Diagnosis not present

## 2016-05-14 DIAGNOSIS — Z992 Dependence on renal dialysis: Secondary | ICD-10-CM | POA: Diagnosis not present

## 2016-05-16 DIAGNOSIS — Z992 Dependence on renal dialysis: Secondary | ICD-10-CM | POA: Diagnosis not present

## 2016-05-16 DIAGNOSIS — N2581 Secondary hyperparathyroidism of renal origin: Secondary | ICD-10-CM | POA: Diagnosis not present

## 2016-05-16 DIAGNOSIS — D631 Anemia in chronic kidney disease: Secondary | ICD-10-CM | POA: Diagnosis not present

## 2016-05-16 DIAGNOSIS — D509 Iron deficiency anemia, unspecified: Secondary | ICD-10-CM | POA: Diagnosis not present

## 2016-05-16 DIAGNOSIS — N186 End stage renal disease: Secondary | ICD-10-CM | POA: Diagnosis not present

## 2016-05-19 DIAGNOSIS — D631 Anemia in chronic kidney disease: Secondary | ICD-10-CM | POA: Diagnosis not present

## 2016-05-19 DIAGNOSIS — Z992 Dependence on renal dialysis: Secondary | ICD-10-CM | POA: Diagnosis not present

## 2016-05-19 DIAGNOSIS — N186 End stage renal disease: Secondary | ICD-10-CM | POA: Diagnosis not present

## 2016-05-19 DIAGNOSIS — D509 Iron deficiency anemia, unspecified: Secondary | ICD-10-CM | POA: Diagnosis not present

## 2016-05-19 DIAGNOSIS — N2581 Secondary hyperparathyroidism of renal origin: Secondary | ICD-10-CM | POA: Diagnosis not present

## 2016-05-21 DIAGNOSIS — Z992 Dependence on renal dialysis: Secondary | ICD-10-CM | POA: Diagnosis not present

## 2016-05-21 DIAGNOSIS — N2581 Secondary hyperparathyroidism of renal origin: Secondary | ICD-10-CM | POA: Diagnosis not present

## 2016-05-21 DIAGNOSIS — D509 Iron deficiency anemia, unspecified: Secondary | ICD-10-CM | POA: Diagnosis not present

## 2016-05-21 DIAGNOSIS — N186 End stage renal disease: Secondary | ICD-10-CM | POA: Diagnosis not present

## 2016-05-21 DIAGNOSIS — D631 Anemia in chronic kidney disease: Secondary | ICD-10-CM | POA: Diagnosis not present

## 2016-05-23 DIAGNOSIS — N186 End stage renal disease: Secondary | ICD-10-CM | POA: Diagnosis not present

## 2016-05-23 DIAGNOSIS — N2581 Secondary hyperparathyroidism of renal origin: Secondary | ICD-10-CM | POA: Diagnosis not present

## 2016-05-23 DIAGNOSIS — Z992 Dependence on renal dialysis: Secondary | ICD-10-CM | POA: Diagnosis not present

## 2016-05-23 DIAGNOSIS — D509 Iron deficiency anemia, unspecified: Secondary | ICD-10-CM | POA: Diagnosis not present

## 2016-05-23 DIAGNOSIS — D631 Anemia in chronic kidney disease: Secondary | ICD-10-CM | POA: Diagnosis not present

## 2016-05-26 DIAGNOSIS — D631 Anemia in chronic kidney disease: Secondary | ICD-10-CM | POA: Diagnosis not present

## 2016-05-26 DIAGNOSIS — N2581 Secondary hyperparathyroidism of renal origin: Secondary | ICD-10-CM | POA: Diagnosis not present

## 2016-05-26 DIAGNOSIS — N186 End stage renal disease: Secondary | ICD-10-CM | POA: Diagnosis not present

## 2016-05-26 DIAGNOSIS — Z992 Dependence on renal dialysis: Secondary | ICD-10-CM | POA: Diagnosis not present

## 2016-05-26 DIAGNOSIS — D509 Iron deficiency anemia, unspecified: Secondary | ICD-10-CM | POA: Diagnosis not present

## 2016-05-28 DIAGNOSIS — N2581 Secondary hyperparathyroidism of renal origin: Secondary | ICD-10-CM | POA: Diagnosis not present

## 2016-05-28 DIAGNOSIS — N186 End stage renal disease: Secondary | ICD-10-CM | POA: Diagnosis not present

## 2016-05-28 DIAGNOSIS — Z992 Dependence on renal dialysis: Secondary | ICD-10-CM | POA: Diagnosis not present

## 2016-05-28 DIAGNOSIS — D631 Anemia in chronic kidney disease: Secondary | ICD-10-CM | POA: Diagnosis not present

## 2016-05-28 DIAGNOSIS — D509 Iron deficiency anemia, unspecified: Secondary | ICD-10-CM | POA: Diagnosis not present

## 2016-05-30 DIAGNOSIS — N186 End stage renal disease: Secondary | ICD-10-CM | POA: Diagnosis not present

## 2016-05-30 DIAGNOSIS — D509 Iron deficiency anemia, unspecified: Secondary | ICD-10-CM | POA: Diagnosis not present

## 2016-05-30 DIAGNOSIS — Z992 Dependence on renal dialysis: Secondary | ICD-10-CM | POA: Diagnosis not present

## 2016-05-30 DIAGNOSIS — D631 Anemia in chronic kidney disease: Secondary | ICD-10-CM | POA: Diagnosis not present

## 2016-05-30 DIAGNOSIS — N2581 Secondary hyperparathyroidism of renal origin: Secondary | ICD-10-CM | POA: Diagnosis not present

## 2016-06-02 DIAGNOSIS — N2581 Secondary hyperparathyroidism of renal origin: Secondary | ICD-10-CM | POA: Diagnosis not present

## 2016-06-02 DIAGNOSIS — Z992 Dependence on renal dialysis: Secondary | ICD-10-CM | POA: Diagnosis not present

## 2016-06-02 DIAGNOSIS — D509 Iron deficiency anemia, unspecified: Secondary | ICD-10-CM | POA: Diagnosis not present

## 2016-06-02 DIAGNOSIS — I12 Hypertensive chronic kidney disease with 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: Secondary | ICD-10-CM | POA: Diagnosis not present

## 2016-06-02 DIAGNOSIS — N186 End stage renal disease: Secondary | ICD-10-CM | POA: Diagnosis not present

## 2016-06-02 DIAGNOSIS — D631 Anemia in chronic kidney disease: Secondary | ICD-10-CM | POA: Diagnosis not present

## 2016-06-04 DIAGNOSIS — D631 Anemia in chronic kidney disease: Secondary | ICD-10-CM | POA: Diagnosis not present

## 2016-06-04 DIAGNOSIS — Z992 Dependence on renal dialysis: Secondary | ICD-10-CM | POA: Diagnosis not present

## 2016-06-04 DIAGNOSIS — D509 Iron deficiency anemia, unspecified: Secondary | ICD-10-CM | POA: Diagnosis not present

## 2016-06-04 DIAGNOSIS — N2581 Secondary hyperparathyroidism of renal origin: Secondary | ICD-10-CM | POA: Diagnosis not present

## 2016-06-04 DIAGNOSIS — N186 End stage renal disease: Secondary | ICD-10-CM | POA: Diagnosis not present

## 2016-06-06 DIAGNOSIS — N186 End stage renal disease: Secondary | ICD-10-CM | POA: Diagnosis not present

## 2016-06-06 DIAGNOSIS — N2581 Secondary hyperparathyroidism of renal origin: Secondary | ICD-10-CM | POA: Diagnosis not present

## 2016-06-06 DIAGNOSIS — D509 Iron deficiency anemia, unspecified: Secondary | ICD-10-CM | POA: Diagnosis not present

## 2016-06-06 DIAGNOSIS — D631 Anemia in chronic kidney disease: Secondary | ICD-10-CM | POA: Diagnosis not present

## 2016-06-06 DIAGNOSIS — Z992 Dependence on renal dialysis: Secondary | ICD-10-CM | POA: Diagnosis not present

## 2016-06-09 DIAGNOSIS — D509 Iron deficiency anemia, unspecified: Secondary | ICD-10-CM | POA: Diagnosis not present

## 2016-06-09 DIAGNOSIS — N2581 Secondary hyperparathyroidism of renal origin: Secondary | ICD-10-CM | POA: Diagnosis not present

## 2016-06-09 DIAGNOSIS — D631 Anemia in chronic kidney disease: Secondary | ICD-10-CM | POA: Diagnosis not present

## 2016-06-09 DIAGNOSIS — Z992 Dependence on renal dialysis: Secondary | ICD-10-CM | POA: Diagnosis not present

## 2016-06-09 DIAGNOSIS — N186 End stage renal disease: Secondary | ICD-10-CM | POA: Diagnosis not present

## 2016-06-11 DIAGNOSIS — Z992 Dependence on renal dialysis: Secondary | ICD-10-CM | POA: Diagnosis not present

## 2016-06-11 DIAGNOSIS — N186 End stage renal disease: Secondary | ICD-10-CM | POA: Diagnosis not present

## 2016-06-11 DIAGNOSIS — D631 Anemia in chronic kidney disease: Secondary | ICD-10-CM | POA: Diagnosis not present

## 2016-06-11 DIAGNOSIS — D509 Iron deficiency anemia, unspecified: Secondary | ICD-10-CM | POA: Diagnosis not present

## 2016-06-11 DIAGNOSIS — N2581 Secondary hyperparathyroidism of renal origin: Secondary | ICD-10-CM | POA: Diagnosis not present

## 2016-06-13 DIAGNOSIS — D509 Iron deficiency anemia, unspecified: Secondary | ICD-10-CM | POA: Diagnosis not present

## 2016-06-13 DIAGNOSIS — N186 End stage renal disease: Secondary | ICD-10-CM | POA: Diagnosis not present

## 2016-06-13 DIAGNOSIS — D631 Anemia in chronic kidney disease: Secondary | ICD-10-CM | POA: Diagnosis not present

## 2016-06-13 DIAGNOSIS — N2581 Secondary hyperparathyroidism of renal origin: Secondary | ICD-10-CM | POA: Diagnosis not present

## 2016-06-13 DIAGNOSIS — Z992 Dependence on renal dialysis: Secondary | ICD-10-CM | POA: Diagnosis not present

## 2016-06-16 DIAGNOSIS — N2581 Secondary hyperparathyroidism of renal origin: Secondary | ICD-10-CM | POA: Diagnosis not present

## 2016-06-16 DIAGNOSIS — D509 Iron deficiency anemia, unspecified: Secondary | ICD-10-CM | POA: Diagnosis not present

## 2016-06-16 DIAGNOSIS — Z992 Dependence on renal dialysis: Secondary | ICD-10-CM | POA: Diagnosis not present

## 2016-06-16 DIAGNOSIS — N186 End stage renal disease: Secondary | ICD-10-CM | POA: Diagnosis not present

## 2016-06-16 DIAGNOSIS — D631 Anemia in chronic kidney disease: Secondary | ICD-10-CM | POA: Diagnosis not present

## 2016-06-18 DIAGNOSIS — N2581 Secondary hyperparathyroidism of renal origin: Secondary | ICD-10-CM | POA: Diagnosis not present

## 2016-06-18 DIAGNOSIS — Z992 Dependence on renal dialysis: Secondary | ICD-10-CM | POA: Diagnosis not present

## 2016-06-18 DIAGNOSIS — D509 Iron deficiency anemia, unspecified: Secondary | ICD-10-CM | POA: Diagnosis not present

## 2016-06-18 DIAGNOSIS — D631 Anemia in chronic kidney disease: Secondary | ICD-10-CM | POA: Diagnosis not present

## 2016-06-18 DIAGNOSIS — N186 End stage renal disease: Secondary | ICD-10-CM | POA: Diagnosis not present

## 2016-06-20 DIAGNOSIS — D509 Iron deficiency anemia, unspecified: Secondary | ICD-10-CM | POA: Diagnosis not present

## 2016-06-20 DIAGNOSIS — N186 End stage renal disease: Secondary | ICD-10-CM | POA: Diagnosis not present

## 2016-06-20 DIAGNOSIS — Z992 Dependence on renal dialysis: Secondary | ICD-10-CM | POA: Diagnosis not present

## 2016-06-20 DIAGNOSIS — D631 Anemia in chronic kidney disease: Secondary | ICD-10-CM | POA: Diagnosis not present

## 2016-06-20 DIAGNOSIS — N2581 Secondary hyperparathyroidism of renal origin: Secondary | ICD-10-CM | POA: Diagnosis not present

## 2016-06-23 DIAGNOSIS — N2581 Secondary hyperparathyroidism of renal origin: Secondary | ICD-10-CM | POA: Diagnosis not present

## 2016-06-23 DIAGNOSIS — Z992 Dependence on renal dialysis: Secondary | ICD-10-CM | POA: Diagnosis not present

## 2016-06-23 DIAGNOSIS — D631 Anemia in chronic kidney disease: Secondary | ICD-10-CM | POA: Diagnosis not present

## 2016-06-23 DIAGNOSIS — N186 End stage renal disease: Secondary | ICD-10-CM | POA: Diagnosis not present

## 2016-06-23 DIAGNOSIS — D509 Iron deficiency anemia, unspecified: Secondary | ICD-10-CM | POA: Diagnosis not present

## 2016-06-25 DIAGNOSIS — D631 Anemia in chronic kidney disease: Secondary | ICD-10-CM | POA: Diagnosis not present

## 2016-06-25 DIAGNOSIS — D509 Iron deficiency anemia, unspecified: Secondary | ICD-10-CM | POA: Diagnosis not present

## 2016-06-25 DIAGNOSIS — N2581 Secondary hyperparathyroidism of renal origin: Secondary | ICD-10-CM | POA: Diagnosis not present

## 2016-06-25 DIAGNOSIS — N186 End stage renal disease: Secondary | ICD-10-CM | POA: Diagnosis not present

## 2016-06-25 DIAGNOSIS — Z992 Dependence on renal dialysis: Secondary | ICD-10-CM | POA: Diagnosis not present

## 2016-06-27 DIAGNOSIS — N186 End stage renal disease: Secondary | ICD-10-CM | POA: Diagnosis not present

## 2016-06-27 DIAGNOSIS — Z992 Dependence on renal dialysis: Secondary | ICD-10-CM | POA: Diagnosis not present

## 2016-06-27 DIAGNOSIS — D509 Iron deficiency anemia, unspecified: Secondary | ICD-10-CM | POA: Diagnosis not present

## 2016-06-27 DIAGNOSIS — D631 Anemia in chronic kidney disease: Secondary | ICD-10-CM | POA: Diagnosis not present

## 2016-06-27 DIAGNOSIS — N2581 Secondary hyperparathyroidism of renal origin: Secondary | ICD-10-CM | POA: Diagnosis not present

## 2016-06-30 DIAGNOSIS — N186 End stage renal disease: Secondary | ICD-10-CM | POA: Diagnosis not present

## 2016-06-30 DIAGNOSIS — D631 Anemia in chronic kidney disease: Secondary | ICD-10-CM | POA: Diagnosis not present

## 2016-06-30 DIAGNOSIS — Z992 Dependence on renal dialysis: Secondary | ICD-10-CM | POA: Diagnosis not present

## 2016-06-30 DIAGNOSIS — D509 Iron deficiency anemia, unspecified: Secondary | ICD-10-CM | POA: Diagnosis not present

## 2016-06-30 DIAGNOSIS — N2581 Secondary hyperparathyroidism of renal origin: Secondary | ICD-10-CM | POA: Diagnosis not present

## 2016-07-02 DIAGNOSIS — D631 Anemia in chronic kidney disease: Secondary | ICD-10-CM | POA: Diagnosis not present

## 2016-07-02 DIAGNOSIS — Z992 Dependence on renal dialysis: Secondary | ICD-10-CM | POA: Diagnosis not present

## 2016-07-02 DIAGNOSIS — D509 Iron deficiency anemia, unspecified: Secondary | ICD-10-CM | POA: Diagnosis not present

## 2016-07-02 DIAGNOSIS — N186 End stage renal disease: Secondary | ICD-10-CM | POA: Diagnosis not present

## 2016-07-02 DIAGNOSIS — N2581 Secondary hyperparathyroidism of renal origin: Secondary | ICD-10-CM | POA: Diagnosis not present

## 2016-07-03 DIAGNOSIS — Z992 Dependence on renal dialysis: Secondary | ICD-10-CM | POA: Diagnosis not present

## 2016-07-03 DIAGNOSIS — N186 End stage renal disease: Secondary | ICD-10-CM | POA: Diagnosis not present

## 2016-07-03 DIAGNOSIS — I12 Hypertensive chronic kidney disease with 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: Secondary | ICD-10-CM | POA: Diagnosis not present

## 2016-07-04 DIAGNOSIS — D689 Coagulation defect, unspecified: Secondary | ICD-10-CM | POA: Diagnosis not present

## 2016-07-04 DIAGNOSIS — N2581 Secondary hyperparathyroidism of renal origin: Secondary | ICD-10-CM | POA: Diagnosis not present

## 2016-07-04 DIAGNOSIS — Z992 Dependence on renal dialysis: Secondary | ICD-10-CM | POA: Diagnosis not present

## 2016-07-04 DIAGNOSIS — D631 Anemia in chronic kidney disease: Secondary | ICD-10-CM | POA: Diagnosis not present

## 2016-07-04 DIAGNOSIS — D509 Iron deficiency anemia, unspecified: Secondary | ICD-10-CM | POA: Diagnosis not present

## 2016-07-04 DIAGNOSIS — N186 End stage renal disease: Secondary | ICD-10-CM | POA: Diagnosis not present

## 2016-07-07 DIAGNOSIS — Z992 Dependence on renal dialysis: Secondary | ICD-10-CM | POA: Diagnosis not present

## 2016-07-07 DIAGNOSIS — N2581 Secondary hyperparathyroidism of renal origin: Secondary | ICD-10-CM | POA: Diagnosis not present

## 2016-07-07 DIAGNOSIS — D631 Anemia in chronic kidney disease: Secondary | ICD-10-CM | POA: Diagnosis not present

## 2016-07-07 DIAGNOSIS — N186 End stage renal disease: Secondary | ICD-10-CM | POA: Diagnosis not present

## 2016-07-07 DIAGNOSIS — D689 Coagulation defect, unspecified: Secondary | ICD-10-CM | POA: Diagnosis not present

## 2016-07-07 DIAGNOSIS — D509 Iron deficiency anemia, unspecified: Secondary | ICD-10-CM | POA: Diagnosis not present

## 2016-07-09 DIAGNOSIS — N2581 Secondary hyperparathyroidism of renal origin: Secondary | ICD-10-CM | POA: Diagnosis not present

## 2016-07-09 DIAGNOSIS — N186 End stage renal disease: Secondary | ICD-10-CM | POA: Diagnosis not present

## 2016-07-09 DIAGNOSIS — D631 Anemia in chronic kidney disease: Secondary | ICD-10-CM | POA: Diagnosis not present

## 2016-07-09 DIAGNOSIS — Z992 Dependence on renal dialysis: Secondary | ICD-10-CM | POA: Diagnosis not present

## 2016-07-09 DIAGNOSIS — D509 Iron deficiency anemia, unspecified: Secondary | ICD-10-CM | POA: Diagnosis not present

## 2016-07-09 DIAGNOSIS — D689 Coagulation defect, unspecified: Secondary | ICD-10-CM | POA: Diagnosis not present

## 2016-07-11 DIAGNOSIS — D509 Iron deficiency anemia, unspecified: Secondary | ICD-10-CM | POA: Diagnosis not present

## 2016-07-11 DIAGNOSIS — D689 Coagulation defect, unspecified: Secondary | ICD-10-CM | POA: Diagnosis not present

## 2016-07-11 DIAGNOSIS — Z992 Dependence on renal dialysis: Secondary | ICD-10-CM | POA: Diagnosis not present

## 2016-07-11 DIAGNOSIS — N2581 Secondary hyperparathyroidism of renal origin: Secondary | ICD-10-CM | POA: Diagnosis not present

## 2016-07-11 DIAGNOSIS — D631 Anemia in chronic kidney disease: Secondary | ICD-10-CM | POA: Diagnosis not present

## 2016-07-11 DIAGNOSIS — N186 End stage renal disease: Secondary | ICD-10-CM | POA: Diagnosis not present

## 2016-07-14 DIAGNOSIS — D509 Iron deficiency anemia, unspecified: Secondary | ICD-10-CM | POA: Diagnosis not present

## 2016-07-14 DIAGNOSIS — D689 Coagulation defect, unspecified: Secondary | ICD-10-CM | POA: Diagnosis not present

## 2016-07-14 DIAGNOSIS — N186 End stage renal disease: Secondary | ICD-10-CM | POA: Diagnosis not present

## 2016-07-14 DIAGNOSIS — Z992 Dependence on renal dialysis: Secondary | ICD-10-CM | POA: Diagnosis not present

## 2016-07-14 DIAGNOSIS — D631 Anemia in chronic kidney disease: Secondary | ICD-10-CM | POA: Diagnosis not present

## 2016-07-14 DIAGNOSIS — N2581 Secondary hyperparathyroidism of renal origin: Secondary | ICD-10-CM | POA: Diagnosis not present

## 2016-07-16 DIAGNOSIS — D689 Coagulation defect, unspecified: Secondary | ICD-10-CM | POA: Diagnosis not present

## 2016-07-16 DIAGNOSIS — D509 Iron deficiency anemia, unspecified: Secondary | ICD-10-CM | POA: Diagnosis not present

## 2016-07-16 DIAGNOSIS — N2581 Secondary hyperparathyroidism of renal origin: Secondary | ICD-10-CM | POA: Diagnosis not present

## 2016-07-16 DIAGNOSIS — D631 Anemia in chronic kidney disease: Secondary | ICD-10-CM | POA: Diagnosis not present

## 2016-07-16 DIAGNOSIS — Z992 Dependence on renal dialysis: Secondary | ICD-10-CM | POA: Diagnosis not present

## 2016-07-16 DIAGNOSIS — N186 End stage renal disease: Secondary | ICD-10-CM | POA: Diagnosis not present

## 2016-07-18 DIAGNOSIS — Z992 Dependence on renal dialysis: Secondary | ICD-10-CM | POA: Diagnosis not present

## 2016-07-18 DIAGNOSIS — D689 Coagulation defect, unspecified: Secondary | ICD-10-CM | POA: Diagnosis not present

## 2016-07-18 DIAGNOSIS — N2581 Secondary hyperparathyroidism of renal origin: Secondary | ICD-10-CM | POA: Diagnosis not present

## 2016-07-18 DIAGNOSIS — N186 End stage renal disease: Secondary | ICD-10-CM | POA: Diagnosis not present

## 2016-07-18 DIAGNOSIS — D509 Iron deficiency anemia, unspecified: Secondary | ICD-10-CM | POA: Diagnosis not present

## 2016-07-18 DIAGNOSIS — D631 Anemia in chronic kidney disease: Secondary | ICD-10-CM | POA: Diagnosis not present

## 2016-07-21 DIAGNOSIS — N2581 Secondary hyperparathyroidism of renal origin: Secondary | ICD-10-CM | POA: Diagnosis not present

## 2016-07-21 DIAGNOSIS — D631 Anemia in chronic kidney disease: Secondary | ICD-10-CM | POA: Diagnosis not present

## 2016-07-21 DIAGNOSIS — D509 Iron deficiency anemia, unspecified: Secondary | ICD-10-CM | POA: Diagnosis not present

## 2016-07-21 DIAGNOSIS — Z992 Dependence on renal dialysis: Secondary | ICD-10-CM | POA: Diagnosis not present

## 2016-07-21 DIAGNOSIS — D689 Coagulation defect, unspecified: Secondary | ICD-10-CM | POA: Diagnosis not present

## 2016-07-21 DIAGNOSIS — N186 End stage renal disease: Secondary | ICD-10-CM | POA: Diagnosis not present

## 2016-07-23 DIAGNOSIS — D689 Coagulation defect, unspecified: Secondary | ICD-10-CM | POA: Diagnosis not present

## 2016-07-23 DIAGNOSIS — N186 End stage renal disease: Secondary | ICD-10-CM | POA: Diagnosis not present

## 2016-07-23 DIAGNOSIS — N2581 Secondary hyperparathyroidism of renal origin: Secondary | ICD-10-CM | POA: Diagnosis not present

## 2016-07-23 DIAGNOSIS — D631 Anemia in chronic kidney disease: Secondary | ICD-10-CM | POA: Diagnosis not present

## 2016-07-23 DIAGNOSIS — D509 Iron deficiency anemia, unspecified: Secondary | ICD-10-CM | POA: Diagnosis not present

## 2016-07-23 DIAGNOSIS — Z992 Dependence on renal dialysis: Secondary | ICD-10-CM | POA: Diagnosis not present

## 2016-07-25 DIAGNOSIS — D689 Coagulation defect, unspecified: Secondary | ICD-10-CM | POA: Diagnosis not present

## 2016-07-25 DIAGNOSIS — N2581 Secondary hyperparathyroidism of renal origin: Secondary | ICD-10-CM | POA: Diagnosis not present

## 2016-07-25 DIAGNOSIS — D509 Iron deficiency anemia, unspecified: Secondary | ICD-10-CM | POA: Diagnosis not present

## 2016-07-25 DIAGNOSIS — D631 Anemia in chronic kidney disease: Secondary | ICD-10-CM | POA: Diagnosis not present

## 2016-07-25 DIAGNOSIS — Z992 Dependence on renal dialysis: Secondary | ICD-10-CM | POA: Diagnosis not present

## 2016-07-25 DIAGNOSIS — N186 End stage renal disease: Secondary | ICD-10-CM | POA: Diagnosis not present

## 2016-07-28 DIAGNOSIS — D631 Anemia in chronic kidney disease: Secondary | ICD-10-CM | POA: Diagnosis not present

## 2016-07-28 DIAGNOSIS — Z992 Dependence on renal dialysis: Secondary | ICD-10-CM | POA: Diagnosis not present

## 2016-07-28 DIAGNOSIS — N186 End stage renal disease: Secondary | ICD-10-CM | POA: Diagnosis not present

## 2016-07-28 DIAGNOSIS — N2581 Secondary hyperparathyroidism of renal origin: Secondary | ICD-10-CM | POA: Diagnosis not present

## 2016-07-28 DIAGNOSIS — D509 Iron deficiency anemia, unspecified: Secondary | ICD-10-CM | POA: Diagnosis not present

## 2016-07-28 DIAGNOSIS — D689 Coagulation defect, unspecified: Secondary | ICD-10-CM | POA: Diagnosis not present

## 2016-07-30 DIAGNOSIS — Z992 Dependence on renal dialysis: Secondary | ICD-10-CM | POA: Diagnosis not present

## 2016-07-30 DIAGNOSIS — D509 Iron deficiency anemia, unspecified: Secondary | ICD-10-CM | POA: Diagnosis not present

## 2016-07-30 DIAGNOSIS — N2581 Secondary hyperparathyroidism of renal origin: Secondary | ICD-10-CM | POA: Diagnosis not present

## 2016-07-30 DIAGNOSIS — N186 End stage renal disease: Secondary | ICD-10-CM | POA: Diagnosis not present

## 2016-07-30 DIAGNOSIS — D689 Coagulation defect, unspecified: Secondary | ICD-10-CM | POA: Diagnosis not present

## 2016-07-30 DIAGNOSIS — D631 Anemia in chronic kidney disease: Secondary | ICD-10-CM | POA: Diagnosis not present

## 2016-08-01 DIAGNOSIS — D509 Iron deficiency anemia, unspecified: Secondary | ICD-10-CM | POA: Diagnosis not present

## 2016-08-01 DIAGNOSIS — D689 Coagulation defect, unspecified: Secondary | ICD-10-CM | POA: Diagnosis not present

## 2016-08-01 DIAGNOSIS — N2581 Secondary hyperparathyroidism of renal origin: Secondary | ICD-10-CM | POA: Diagnosis not present

## 2016-08-01 DIAGNOSIS — D631 Anemia in chronic kidney disease: Secondary | ICD-10-CM | POA: Diagnosis not present

## 2016-08-01 DIAGNOSIS — N186 End stage renal disease: Secondary | ICD-10-CM | POA: Diagnosis not present

## 2016-08-01 DIAGNOSIS — Z992 Dependence on renal dialysis: Secondary | ICD-10-CM | POA: Diagnosis not present

## 2016-08-02 DIAGNOSIS — Z992 Dependence on renal dialysis: Secondary | ICD-10-CM | POA: Diagnosis not present

## 2016-08-02 DIAGNOSIS — I12 Hypertensive chronic kidney disease with 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: Secondary | ICD-10-CM | POA: Diagnosis not present

## 2016-08-02 DIAGNOSIS — N186 End stage renal disease: Secondary | ICD-10-CM | POA: Diagnosis not present

## 2016-08-04 DIAGNOSIS — N186 End stage renal disease: Secondary | ICD-10-CM | POA: Diagnosis not present

## 2016-08-04 DIAGNOSIS — N2581 Secondary hyperparathyroidism of renal origin: Secondary | ICD-10-CM | POA: Diagnosis not present

## 2016-08-04 DIAGNOSIS — D509 Iron deficiency anemia, unspecified: Secondary | ICD-10-CM | POA: Diagnosis not present

## 2016-08-04 DIAGNOSIS — D631 Anemia in chronic kidney disease: Secondary | ICD-10-CM | POA: Diagnosis not present

## 2016-08-04 DIAGNOSIS — Z992 Dependence on renal dialysis: Secondary | ICD-10-CM | POA: Diagnosis not present

## 2016-08-06 DIAGNOSIS — D509 Iron deficiency anemia, unspecified: Secondary | ICD-10-CM | POA: Diagnosis not present

## 2016-08-06 DIAGNOSIS — N186 End stage renal disease: Secondary | ICD-10-CM | POA: Diagnosis not present

## 2016-08-06 DIAGNOSIS — N2581 Secondary hyperparathyroidism of renal origin: Secondary | ICD-10-CM | POA: Diagnosis not present

## 2016-08-06 DIAGNOSIS — Z992 Dependence on renal dialysis: Secondary | ICD-10-CM | POA: Diagnosis not present

## 2016-08-06 DIAGNOSIS — D631 Anemia in chronic kidney disease: Secondary | ICD-10-CM | POA: Diagnosis not present

## 2016-08-08 DIAGNOSIS — D631 Anemia in chronic kidney disease: Secondary | ICD-10-CM | POA: Diagnosis not present

## 2016-08-08 DIAGNOSIS — D509 Iron deficiency anemia, unspecified: Secondary | ICD-10-CM | POA: Diagnosis not present

## 2016-08-08 DIAGNOSIS — N186 End stage renal disease: Secondary | ICD-10-CM | POA: Diagnosis not present

## 2016-08-08 DIAGNOSIS — Z992 Dependence on renal dialysis: Secondary | ICD-10-CM | POA: Diagnosis not present

## 2016-08-08 DIAGNOSIS — N2581 Secondary hyperparathyroidism of renal origin: Secondary | ICD-10-CM | POA: Diagnosis not present

## 2016-08-11 DIAGNOSIS — N186 End stage renal disease: Secondary | ICD-10-CM | POA: Diagnosis not present

## 2016-08-11 DIAGNOSIS — D509 Iron deficiency anemia, unspecified: Secondary | ICD-10-CM | POA: Diagnosis not present

## 2016-08-11 DIAGNOSIS — Z992 Dependence on renal dialysis: Secondary | ICD-10-CM | POA: Diagnosis not present

## 2016-08-11 DIAGNOSIS — N2581 Secondary hyperparathyroidism of renal origin: Secondary | ICD-10-CM | POA: Diagnosis not present

## 2016-08-11 DIAGNOSIS — D631 Anemia in chronic kidney disease: Secondary | ICD-10-CM | POA: Diagnosis not present

## 2016-08-13 DIAGNOSIS — D509 Iron deficiency anemia, unspecified: Secondary | ICD-10-CM | POA: Diagnosis not present

## 2016-08-13 DIAGNOSIS — N186 End stage renal disease: Secondary | ICD-10-CM | POA: Diagnosis not present

## 2016-08-13 DIAGNOSIS — D631 Anemia in chronic kidney disease: Secondary | ICD-10-CM | POA: Diagnosis not present

## 2016-08-13 DIAGNOSIS — Z992 Dependence on renal dialysis: Secondary | ICD-10-CM | POA: Diagnosis not present

## 2016-08-13 DIAGNOSIS — N2581 Secondary hyperparathyroidism of renal origin: Secondary | ICD-10-CM | POA: Diagnosis not present

## 2016-08-15 DIAGNOSIS — N2581 Secondary hyperparathyroidism of renal origin: Secondary | ICD-10-CM | POA: Diagnosis not present

## 2016-08-15 DIAGNOSIS — Z992 Dependence on renal dialysis: Secondary | ICD-10-CM | POA: Diagnosis not present

## 2016-08-15 DIAGNOSIS — D509 Iron deficiency anemia, unspecified: Secondary | ICD-10-CM | POA: Diagnosis not present

## 2016-08-15 DIAGNOSIS — N186 End stage renal disease: Secondary | ICD-10-CM | POA: Diagnosis not present

## 2016-08-15 DIAGNOSIS — D631 Anemia in chronic kidney disease: Secondary | ICD-10-CM | POA: Diagnosis not present

## 2016-08-18 DIAGNOSIS — D631 Anemia in chronic kidney disease: Secondary | ICD-10-CM | POA: Diagnosis not present

## 2016-08-18 DIAGNOSIS — D509 Iron deficiency anemia, unspecified: Secondary | ICD-10-CM | POA: Diagnosis not present

## 2016-08-18 DIAGNOSIS — Z992 Dependence on renal dialysis: Secondary | ICD-10-CM | POA: Diagnosis not present

## 2016-08-18 DIAGNOSIS — N186 End stage renal disease: Secondary | ICD-10-CM | POA: Diagnosis not present

## 2016-08-18 DIAGNOSIS — N2581 Secondary hyperparathyroidism of renal origin: Secondary | ICD-10-CM | POA: Diagnosis not present

## 2016-08-20 DIAGNOSIS — D631 Anemia in chronic kidney disease: Secondary | ICD-10-CM | POA: Diagnosis not present

## 2016-08-20 DIAGNOSIS — Z992 Dependence on renal dialysis: Secondary | ICD-10-CM | POA: Diagnosis not present

## 2016-08-20 DIAGNOSIS — N186 End stage renal disease: Secondary | ICD-10-CM | POA: Diagnosis not present

## 2016-08-20 DIAGNOSIS — N2581 Secondary hyperparathyroidism of renal origin: Secondary | ICD-10-CM | POA: Diagnosis not present

## 2016-08-20 DIAGNOSIS — D509 Iron deficiency anemia, unspecified: Secondary | ICD-10-CM | POA: Diagnosis not present

## 2016-08-22 DIAGNOSIS — D631 Anemia in chronic kidney disease: Secondary | ICD-10-CM | POA: Diagnosis not present

## 2016-08-22 DIAGNOSIS — D509 Iron deficiency anemia, unspecified: Secondary | ICD-10-CM | POA: Diagnosis not present

## 2016-08-22 DIAGNOSIS — N2581 Secondary hyperparathyroidism of renal origin: Secondary | ICD-10-CM | POA: Diagnosis not present

## 2016-08-22 DIAGNOSIS — Z992 Dependence on renal dialysis: Secondary | ICD-10-CM | POA: Diagnosis not present

## 2016-08-22 DIAGNOSIS — N186 End stage renal disease: Secondary | ICD-10-CM | POA: Diagnosis not present

## 2016-08-25 DIAGNOSIS — D631 Anemia in chronic kidney disease: Secondary | ICD-10-CM | POA: Diagnosis not present

## 2016-08-25 DIAGNOSIS — Z992 Dependence on renal dialysis: Secondary | ICD-10-CM | POA: Diagnosis not present

## 2016-08-25 DIAGNOSIS — N186 End stage renal disease: Secondary | ICD-10-CM | POA: Diagnosis not present

## 2016-08-25 DIAGNOSIS — N2581 Secondary hyperparathyroidism of renal origin: Secondary | ICD-10-CM | POA: Diagnosis not present

## 2016-08-25 DIAGNOSIS — D509 Iron deficiency anemia, unspecified: Secondary | ICD-10-CM | POA: Diagnosis not present

## 2016-08-27 DIAGNOSIS — Z992 Dependence on renal dialysis: Secondary | ICD-10-CM | POA: Diagnosis not present

## 2016-08-27 DIAGNOSIS — D509 Iron deficiency anemia, unspecified: Secondary | ICD-10-CM | POA: Diagnosis not present

## 2016-08-27 DIAGNOSIS — N186 End stage renal disease: Secondary | ICD-10-CM | POA: Diagnosis not present

## 2016-08-27 DIAGNOSIS — N2581 Secondary hyperparathyroidism of renal origin: Secondary | ICD-10-CM | POA: Diagnosis not present

## 2016-08-27 DIAGNOSIS — D631 Anemia in chronic kidney disease: Secondary | ICD-10-CM | POA: Diagnosis not present

## 2016-08-29 DIAGNOSIS — N2581 Secondary hyperparathyroidism of renal origin: Secondary | ICD-10-CM | POA: Diagnosis not present

## 2016-08-29 DIAGNOSIS — Z992 Dependence on renal dialysis: Secondary | ICD-10-CM | POA: Diagnosis not present

## 2016-08-29 DIAGNOSIS — N186 End stage renal disease: Secondary | ICD-10-CM | POA: Diagnosis not present

## 2016-08-29 DIAGNOSIS — D509 Iron deficiency anemia, unspecified: Secondary | ICD-10-CM | POA: Diagnosis not present

## 2016-08-29 DIAGNOSIS — D631 Anemia in chronic kidney disease: Secondary | ICD-10-CM | POA: Diagnosis not present

## 2016-09-01 DIAGNOSIS — N2581 Secondary hyperparathyroidism of renal origin: Secondary | ICD-10-CM | POA: Diagnosis not present

## 2016-09-01 DIAGNOSIS — N186 End stage renal disease: Secondary | ICD-10-CM | POA: Diagnosis not present

## 2016-09-01 DIAGNOSIS — D631 Anemia in chronic kidney disease: Secondary | ICD-10-CM | POA: Diagnosis not present

## 2016-09-01 DIAGNOSIS — Z992 Dependence on renal dialysis: Secondary | ICD-10-CM | POA: Diagnosis not present

## 2016-09-01 DIAGNOSIS — D509 Iron deficiency anemia, unspecified: Secondary | ICD-10-CM | POA: Diagnosis not present

## 2016-09-02 DIAGNOSIS — I12 Hypertensive chronic kidney disease with 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: Secondary | ICD-10-CM | POA: Diagnosis not present

## 2016-09-02 DIAGNOSIS — Z992 Dependence on renal dialysis: Secondary | ICD-10-CM | POA: Diagnosis not present

## 2016-09-02 DIAGNOSIS — N186 End stage renal disease: Secondary | ICD-10-CM | POA: Diagnosis not present

## 2016-09-03 DIAGNOSIS — D631 Anemia in chronic kidney disease: Secondary | ICD-10-CM | POA: Diagnosis not present

## 2016-09-03 DIAGNOSIS — N186 End stage renal disease: Secondary | ICD-10-CM | POA: Diagnosis not present

## 2016-09-03 DIAGNOSIS — D509 Iron deficiency anemia, unspecified: Secondary | ICD-10-CM | POA: Diagnosis not present

## 2016-09-03 DIAGNOSIS — N2581 Secondary hyperparathyroidism of renal origin: Secondary | ICD-10-CM | POA: Diagnosis not present

## 2016-09-03 DIAGNOSIS — Z992 Dependence on renal dialysis: Secondary | ICD-10-CM | POA: Diagnosis not present

## 2016-09-05 DIAGNOSIS — N2581 Secondary hyperparathyroidism of renal origin: Secondary | ICD-10-CM | POA: Diagnosis not present

## 2016-09-05 DIAGNOSIS — N186 End stage renal disease: Secondary | ICD-10-CM | POA: Diagnosis not present

## 2016-09-05 DIAGNOSIS — D631 Anemia in chronic kidney disease: Secondary | ICD-10-CM | POA: Diagnosis not present

## 2016-09-05 DIAGNOSIS — Z992 Dependence on renal dialysis: Secondary | ICD-10-CM | POA: Diagnosis not present

## 2016-09-05 DIAGNOSIS — D509 Iron deficiency anemia, unspecified: Secondary | ICD-10-CM | POA: Diagnosis not present

## 2016-09-08 DIAGNOSIS — D631 Anemia in chronic kidney disease: Secondary | ICD-10-CM | POA: Diagnosis not present

## 2016-09-08 DIAGNOSIS — D509 Iron deficiency anemia, unspecified: Secondary | ICD-10-CM | POA: Diagnosis not present

## 2016-09-08 DIAGNOSIS — Z992 Dependence on renal dialysis: Secondary | ICD-10-CM | POA: Diagnosis not present

## 2016-09-08 DIAGNOSIS — N2581 Secondary hyperparathyroidism of renal origin: Secondary | ICD-10-CM | POA: Diagnosis not present

## 2016-09-08 DIAGNOSIS — N186 End stage renal disease: Secondary | ICD-10-CM | POA: Diagnosis not present

## 2016-09-10 DIAGNOSIS — D631 Anemia in chronic kidney disease: Secondary | ICD-10-CM | POA: Diagnosis not present

## 2016-09-10 DIAGNOSIS — D509 Iron deficiency anemia, unspecified: Secondary | ICD-10-CM | POA: Diagnosis not present

## 2016-09-10 DIAGNOSIS — N2581 Secondary hyperparathyroidism of renal origin: Secondary | ICD-10-CM | POA: Diagnosis not present

## 2016-09-10 DIAGNOSIS — N186 End stage renal disease: Secondary | ICD-10-CM | POA: Diagnosis not present

## 2016-09-10 DIAGNOSIS — Z992 Dependence on renal dialysis: Secondary | ICD-10-CM | POA: Diagnosis not present

## 2016-09-12 DIAGNOSIS — D631 Anemia in chronic kidney disease: Secondary | ICD-10-CM | POA: Diagnosis not present

## 2016-09-12 DIAGNOSIS — N2581 Secondary hyperparathyroidism of renal origin: Secondary | ICD-10-CM | POA: Diagnosis not present

## 2016-09-12 DIAGNOSIS — Z992 Dependence on renal dialysis: Secondary | ICD-10-CM | POA: Diagnosis not present

## 2016-09-12 DIAGNOSIS — N186 End stage renal disease: Secondary | ICD-10-CM | POA: Diagnosis not present

## 2016-09-12 DIAGNOSIS — D509 Iron deficiency anemia, unspecified: Secondary | ICD-10-CM | POA: Diagnosis not present

## 2016-09-15 DIAGNOSIS — D509 Iron deficiency anemia, unspecified: Secondary | ICD-10-CM | POA: Diagnosis not present

## 2016-09-15 DIAGNOSIS — D631 Anemia in chronic kidney disease: Secondary | ICD-10-CM | POA: Diagnosis not present

## 2016-09-15 DIAGNOSIS — Z992 Dependence on renal dialysis: Secondary | ICD-10-CM | POA: Diagnosis not present

## 2016-09-15 DIAGNOSIS — N2581 Secondary hyperparathyroidism of renal origin: Secondary | ICD-10-CM | POA: Diagnosis not present

## 2016-09-15 DIAGNOSIS — N186 End stage renal disease: Secondary | ICD-10-CM | POA: Diagnosis not present

## 2016-09-17 DIAGNOSIS — D509 Iron deficiency anemia, unspecified: Secondary | ICD-10-CM | POA: Diagnosis not present

## 2016-09-17 DIAGNOSIS — Z992 Dependence on renal dialysis: Secondary | ICD-10-CM | POA: Diagnosis not present

## 2016-09-17 DIAGNOSIS — N2581 Secondary hyperparathyroidism of renal origin: Secondary | ICD-10-CM | POA: Diagnosis not present

## 2016-09-17 DIAGNOSIS — N186 End stage renal disease: Secondary | ICD-10-CM | POA: Diagnosis not present

## 2016-09-17 DIAGNOSIS — D631 Anemia in chronic kidney disease: Secondary | ICD-10-CM | POA: Diagnosis not present

## 2016-09-19 DIAGNOSIS — N2581 Secondary hyperparathyroidism of renal origin: Secondary | ICD-10-CM | POA: Diagnosis not present

## 2016-09-19 DIAGNOSIS — N186 End stage renal disease: Secondary | ICD-10-CM | POA: Diagnosis not present

## 2016-09-19 DIAGNOSIS — D631 Anemia in chronic kidney disease: Secondary | ICD-10-CM | POA: Diagnosis not present

## 2016-09-19 DIAGNOSIS — D509 Iron deficiency anemia, unspecified: Secondary | ICD-10-CM | POA: Diagnosis not present

## 2016-09-19 DIAGNOSIS — Z992 Dependence on renal dialysis: Secondary | ICD-10-CM | POA: Diagnosis not present

## 2016-09-22 DIAGNOSIS — N186 End stage renal disease: Secondary | ICD-10-CM | POA: Diagnosis not present

## 2016-09-22 DIAGNOSIS — N2581 Secondary hyperparathyroidism of renal origin: Secondary | ICD-10-CM | POA: Diagnosis not present

## 2016-09-22 DIAGNOSIS — Z992 Dependence on renal dialysis: Secondary | ICD-10-CM | POA: Diagnosis not present

## 2016-09-22 DIAGNOSIS — D509 Iron deficiency anemia, unspecified: Secondary | ICD-10-CM | POA: Diagnosis not present

## 2016-09-22 DIAGNOSIS — D631 Anemia in chronic kidney disease: Secondary | ICD-10-CM | POA: Diagnosis not present

## 2016-09-24 DIAGNOSIS — N186 End stage renal disease: Secondary | ICD-10-CM | POA: Diagnosis not present

## 2016-09-24 DIAGNOSIS — N2581 Secondary hyperparathyroidism of renal origin: Secondary | ICD-10-CM | POA: Diagnosis not present

## 2016-09-24 DIAGNOSIS — Z992 Dependence on renal dialysis: Secondary | ICD-10-CM | POA: Diagnosis not present

## 2016-09-24 DIAGNOSIS — D509 Iron deficiency anemia, unspecified: Secondary | ICD-10-CM | POA: Diagnosis not present

## 2016-09-24 DIAGNOSIS — D631 Anemia in chronic kidney disease: Secondary | ICD-10-CM | POA: Diagnosis not present

## 2016-09-26 DIAGNOSIS — N2581 Secondary hyperparathyroidism of renal origin: Secondary | ICD-10-CM | POA: Diagnosis not present

## 2016-09-26 DIAGNOSIS — D509 Iron deficiency anemia, unspecified: Secondary | ICD-10-CM | POA: Diagnosis not present

## 2016-09-26 DIAGNOSIS — N186 End stage renal disease: Secondary | ICD-10-CM | POA: Diagnosis not present

## 2016-09-26 DIAGNOSIS — D631 Anemia in chronic kidney disease: Secondary | ICD-10-CM | POA: Diagnosis not present

## 2016-09-26 DIAGNOSIS — Z992 Dependence on renal dialysis: Secondary | ICD-10-CM | POA: Diagnosis not present

## 2016-09-29 DIAGNOSIS — D631 Anemia in chronic kidney disease: Secondary | ICD-10-CM | POA: Diagnosis not present

## 2016-09-29 DIAGNOSIS — Z992 Dependence on renal dialysis: Secondary | ICD-10-CM | POA: Diagnosis not present

## 2016-09-29 DIAGNOSIS — N2581 Secondary hyperparathyroidism of renal origin: Secondary | ICD-10-CM | POA: Diagnosis not present

## 2016-09-29 DIAGNOSIS — D509 Iron deficiency anemia, unspecified: Secondary | ICD-10-CM | POA: Diagnosis not present

## 2016-09-29 DIAGNOSIS — N186 End stage renal disease: Secondary | ICD-10-CM | POA: Diagnosis not present

## 2016-10-01 DIAGNOSIS — Z992 Dependence on renal dialysis: Secondary | ICD-10-CM | POA: Diagnosis not present

## 2016-10-01 DIAGNOSIS — N186 End stage renal disease: Secondary | ICD-10-CM | POA: Diagnosis not present

## 2016-10-01 DIAGNOSIS — D631 Anemia in chronic kidney disease: Secondary | ICD-10-CM | POA: Diagnosis not present

## 2016-10-01 DIAGNOSIS — N2581 Secondary hyperparathyroidism of renal origin: Secondary | ICD-10-CM | POA: Diagnosis not present

## 2016-10-01 DIAGNOSIS — D509 Iron deficiency anemia, unspecified: Secondary | ICD-10-CM | POA: Diagnosis not present

## 2016-10-03 DIAGNOSIS — D631 Anemia in chronic kidney disease: Secondary | ICD-10-CM | POA: Diagnosis not present

## 2016-10-03 DIAGNOSIS — Z992 Dependence on renal dialysis: Secondary | ICD-10-CM | POA: Diagnosis not present

## 2016-10-03 DIAGNOSIS — N2581 Secondary hyperparathyroidism of renal origin: Secondary | ICD-10-CM | POA: Diagnosis not present

## 2016-10-03 DIAGNOSIS — I12 Hypertensive chronic kidney disease with 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: Secondary | ICD-10-CM | POA: Diagnosis not present

## 2016-10-03 DIAGNOSIS — N186 End stage renal disease: Secondary | ICD-10-CM | POA: Diagnosis not present

## 2016-10-03 DIAGNOSIS — D509 Iron deficiency anemia, unspecified: Secondary | ICD-10-CM | POA: Diagnosis not present

## 2016-10-06 DIAGNOSIS — D631 Anemia in chronic kidney disease: Secondary | ICD-10-CM | POA: Diagnosis not present

## 2016-10-06 DIAGNOSIS — N186 End stage renal disease: Secondary | ICD-10-CM | POA: Diagnosis not present

## 2016-10-06 DIAGNOSIS — N2581 Secondary hyperparathyroidism of renal origin: Secondary | ICD-10-CM | POA: Diagnosis not present

## 2016-10-06 DIAGNOSIS — Z992 Dependence on renal dialysis: Secondary | ICD-10-CM | POA: Diagnosis not present

## 2016-10-06 DIAGNOSIS — D509 Iron deficiency anemia, unspecified: Secondary | ICD-10-CM | POA: Diagnosis not present

## 2016-10-08 DIAGNOSIS — D509 Iron deficiency anemia, unspecified: Secondary | ICD-10-CM | POA: Diagnosis not present

## 2016-10-08 DIAGNOSIS — N186 End stage renal disease: Secondary | ICD-10-CM | POA: Diagnosis not present

## 2016-10-08 DIAGNOSIS — Z992 Dependence on renal dialysis: Secondary | ICD-10-CM | POA: Diagnosis not present

## 2016-10-08 DIAGNOSIS — N2581 Secondary hyperparathyroidism of renal origin: Secondary | ICD-10-CM | POA: Diagnosis not present

## 2016-10-08 DIAGNOSIS — D631 Anemia in chronic kidney disease: Secondary | ICD-10-CM | POA: Diagnosis not present

## 2016-10-10 DIAGNOSIS — D509 Iron deficiency anemia, unspecified: Secondary | ICD-10-CM | POA: Diagnosis not present

## 2016-10-10 DIAGNOSIS — Z992 Dependence on renal dialysis: Secondary | ICD-10-CM | POA: Diagnosis not present

## 2016-10-10 DIAGNOSIS — N2581 Secondary hyperparathyroidism of renal origin: Secondary | ICD-10-CM | POA: Diagnosis not present

## 2016-10-10 DIAGNOSIS — D631 Anemia in chronic kidney disease: Secondary | ICD-10-CM | POA: Diagnosis not present

## 2016-10-10 DIAGNOSIS — N186 End stage renal disease: Secondary | ICD-10-CM | POA: Diagnosis not present

## 2016-10-13 DIAGNOSIS — D509 Iron deficiency anemia, unspecified: Secondary | ICD-10-CM | POA: Diagnosis not present

## 2016-10-13 DIAGNOSIS — Z992 Dependence on renal dialysis: Secondary | ICD-10-CM | POA: Diagnosis not present

## 2016-10-13 DIAGNOSIS — N2581 Secondary hyperparathyroidism of renal origin: Secondary | ICD-10-CM | POA: Diagnosis not present

## 2016-10-13 DIAGNOSIS — N186 End stage renal disease: Secondary | ICD-10-CM | POA: Diagnosis not present

## 2016-10-13 DIAGNOSIS — D631 Anemia in chronic kidney disease: Secondary | ICD-10-CM | POA: Diagnosis not present

## 2016-10-15 DIAGNOSIS — D509 Iron deficiency anemia, unspecified: Secondary | ICD-10-CM | POA: Diagnosis not present

## 2016-10-15 DIAGNOSIS — D631 Anemia in chronic kidney disease: Secondary | ICD-10-CM | POA: Diagnosis not present

## 2016-10-15 DIAGNOSIS — N2581 Secondary hyperparathyroidism of renal origin: Secondary | ICD-10-CM | POA: Diagnosis not present

## 2016-10-15 DIAGNOSIS — N186 End stage renal disease: Secondary | ICD-10-CM | POA: Diagnosis not present

## 2016-10-15 DIAGNOSIS — Z992 Dependence on renal dialysis: Secondary | ICD-10-CM | POA: Diagnosis not present

## 2016-10-17 DIAGNOSIS — N2581 Secondary hyperparathyroidism of renal origin: Secondary | ICD-10-CM | POA: Diagnosis not present

## 2016-10-17 DIAGNOSIS — D631 Anemia in chronic kidney disease: Secondary | ICD-10-CM | POA: Diagnosis not present

## 2016-10-17 DIAGNOSIS — N186 End stage renal disease: Secondary | ICD-10-CM | POA: Diagnosis not present

## 2016-10-17 DIAGNOSIS — D509 Iron deficiency anemia, unspecified: Secondary | ICD-10-CM | POA: Diagnosis not present

## 2016-10-17 DIAGNOSIS — Z992 Dependence on renal dialysis: Secondary | ICD-10-CM | POA: Diagnosis not present

## 2016-10-20 DIAGNOSIS — Z992 Dependence on renal dialysis: Secondary | ICD-10-CM | POA: Diagnosis not present

## 2016-10-20 DIAGNOSIS — N2581 Secondary hyperparathyroidism of renal origin: Secondary | ICD-10-CM | POA: Diagnosis not present

## 2016-10-20 DIAGNOSIS — N186 End stage renal disease: Secondary | ICD-10-CM | POA: Diagnosis not present

## 2016-10-20 DIAGNOSIS — D631 Anemia in chronic kidney disease: Secondary | ICD-10-CM | POA: Diagnosis not present

## 2016-10-20 DIAGNOSIS — D509 Iron deficiency anemia, unspecified: Secondary | ICD-10-CM | POA: Diagnosis not present

## 2016-10-22 DIAGNOSIS — D509 Iron deficiency anemia, unspecified: Secondary | ICD-10-CM | POA: Diagnosis not present

## 2016-10-22 DIAGNOSIS — D631 Anemia in chronic kidney disease: Secondary | ICD-10-CM | POA: Diagnosis not present

## 2016-10-22 DIAGNOSIS — Z992 Dependence on renal dialysis: Secondary | ICD-10-CM | POA: Diagnosis not present

## 2016-10-22 DIAGNOSIS — N2581 Secondary hyperparathyroidism of renal origin: Secondary | ICD-10-CM | POA: Diagnosis not present

## 2016-10-22 DIAGNOSIS — N186 End stage renal disease: Secondary | ICD-10-CM | POA: Diagnosis not present

## 2016-10-24 DIAGNOSIS — Z992 Dependence on renal dialysis: Secondary | ICD-10-CM | POA: Diagnosis not present

## 2016-10-24 DIAGNOSIS — D509 Iron deficiency anemia, unspecified: Secondary | ICD-10-CM | POA: Diagnosis not present

## 2016-10-24 DIAGNOSIS — N2581 Secondary hyperparathyroidism of renal origin: Secondary | ICD-10-CM | POA: Diagnosis not present

## 2016-10-24 DIAGNOSIS — D631 Anemia in chronic kidney disease: Secondary | ICD-10-CM | POA: Diagnosis not present

## 2016-10-24 DIAGNOSIS — N186 End stage renal disease: Secondary | ICD-10-CM | POA: Diagnosis not present

## 2016-10-27 DIAGNOSIS — Z992 Dependence on renal dialysis: Secondary | ICD-10-CM | POA: Diagnosis not present

## 2016-10-27 DIAGNOSIS — N2581 Secondary hyperparathyroidism of renal origin: Secondary | ICD-10-CM | POA: Diagnosis not present

## 2016-10-27 DIAGNOSIS — D631 Anemia in chronic kidney disease: Secondary | ICD-10-CM | POA: Diagnosis not present

## 2016-10-27 DIAGNOSIS — N186 End stage renal disease: Secondary | ICD-10-CM | POA: Diagnosis not present

## 2016-10-27 DIAGNOSIS — D509 Iron deficiency anemia, unspecified: Secondary | ICD-10-CM | POA: Diagnosis not present

## 2016-10-29 DIAGNOSIS — N186 End stage renal disease: Secondary | ICD-10-CM | POA: Diagnosis not present

## 2016-10-29 DIAGNOSIS — Z992 Dependence on renal dialysis: Secondary | ICD-10-CM | POA: Diagnosis not present

## 2016-10-29 DIAGNOSIS — D509 Iron deficiency anemia, unspecified: Secondary | ICD-10-CM | POA: Diagnosis not present

## 2016-10-29 DIAGNOSIS — N2581 Secondary hyperparathyroidism of renal origin: Secondary | ICD-10-CM | POA: Diagnosis not present

## 2016-10-29 DIAGNOSIS — D631 Anemia in chronic kidney disease: Secondary | ICD-10-CM | POA: Diagnosis not present

## 2016-10-31 DIAGNOSIS — D509 Iron deficiency anemia, unspecified: Secondary | ICD-10-CM | POA: Diagnosis not present

## 2016-10-31 DIAGNOSIS — Z992 Dependence on renal dialysis: Secondary | ICD-10-CM | POA: Diagnosis not present

## 2016-10-31 DIAGNOSIS — N2581 Secondary hyperparathyroidism of renal origin: Secondary | ICD-10-CM | POA: Diagnosis not present

## 2016-10-31 DIAGNOSIS — D631 Anemia in chronic kidney disease: Secondary | ICD-10-CM | POA: Diagnosis not present

## 2016-10-31 DIAGNOSIS — N186 End stage renal disease: Secondary | ICD-10-CM | POA: Diagnosis not present

## 2016-11-02 DIAGNOSIS — Z992 Dependence on renal dialysis: Secondary | ICD-10-CM | POA: Diagnosis not present

## 2016-11-02 DIAGNOSIS — N186 End stage renal disease: Secondary | ICD-10-CM | POA: Diagnosis not present

## 2016-11-02 DIAGNOSIS — I12 Hypertensive chronic kidney disease with 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: Secondary | ICD-10-CM | POA: Diagnosis not present

## 2016-11-03 DIAGNOSIS — D509 Iron deficiency anemia, unspecified: Secondary | ICD-10-CM | POA: Diagnosis not present

## 2016-11-03 DIAGNOSIS — N2581 Secondary hyperparathyroidism of renal origin: Secondary | ICD-10-CM | POA: Diagnosis not present

## 2016-11-03 DIAGNOSIS — N186 End stage renal disease: Secondary | ICD-10-CM | POA: Diagnosis not present

## 2016-11-03 DIAGNOSIS — Z992 Dependence on renal dialysis: Secondary | ICD-10-CM | POA: Diagnosis not present

## 2016-11-03 DIAGNOSIS — D631 Anemia in chronic kidney disease: Secondary | ICD-10-CM | POA: Diagnosis not present

## 2016-11-05 DIAGNOSIS — Z992 Dependence on renal dialysis: Secondary | ICD-10-CM | POA: Diagnosis not present

## 2016-11-05 DIAGNOSIS — D631 Anemia in chronic kidney disease: Secondary | ICD-10-CM | POA: Diagnosis not present

## 2016-11-05 DIAGNOSIS — D509 Iron deficiency anemia, unspecified: Secondary | ICD-10-CM | POA: Diagnosis not present

## 2016-11-05 DIAGNOSIS — N2581 Secondary hyperparathyroidism of renal origin: Secondary | ICD-10-CM | POA: Diagnosis not present

## 2016-11-05 DIAGNOSIS — N186 End stage renal disease: Secondary | ICD-10-CM | POA: Diagnosis not present

## 2016-11-07 DIAGNOSIS — N2581 Secondary hyperparathyroidism of renal origin: Secondary | ICD-10-CM | POA: Diagnosis not present

## 2016-11-07 DIAGNOSIS — N186 End stage renal disease: Secondary | ICD-10-CM | POA: Diagnosis not present

## 2016-11-07 DIAGNOSIS — Z992 Dependence on renal dialysis: Secondary | ICD-10-CM | POA: Diagnosis not present

## 2016-11-07 DIAGNOSIS — D509 Iron deficiency anemia, unspecified: Secondary | ICD-10-CM | POA: Diagnosis not present

## 2016-11-07 DIAGNOSIS — D631 Anemia in chronic kidney disease: Secondary | ICD-10-CM | POA: Diagnosis not present

## 2016-11-10 DIAGNOSIS — D509 Iron deficiency anemia, unspecified: Secondary | ICD-10-CM | POA: Diagnosis not present

## 2016-11-10 DIAGNOSIS — Z992 Dependence on renal dialysis: Secondary | ICD-10-CM | POA: Diagnosis not present

## 2016-11-10 DIAGNOSIS — D631 Anemia in chronic kidney disease: Secondary | ICD-10-CM | POA: Diagnosis not present

## 2016-11-10 DIAGNOSIS — N2581 Secondary hyperparathyroidism of renal origin: Secondary | ICD-10-CM | POA: Diagnosis not present

## 2016-11-10 DIAGNOSIS — N186 End stage renal disease: Secondary | ICD-10-CM | POA: Diagnosis not present

## 2016-11-12 DIAGNOSIS — N186 End stage renal disease: Secondary | ICD-10-CM | POA: Diagnosis not present

## 2016-11-12 DIAGNOSIS — Z992 Dependence on renal dialysis: Secondary | ICD-10-CM | POA: Diagnosis not present

## 2016-11-12 DIAGNOSIS — D509 Iron deficiency anemia, unspecified: Secondary | ICD-10-CM | POA: Diagnosis not present

## 2016-11-12 DIAGNOSIS — N2581 Secondary hyperparathyroidism of renal origin: Secondary | ICD-10-CM | POA: Diagnosis not present

## 2016-11-12 DIAGNOSIS — D631 Anemia in chronic kidney disease: Secondary | ICD-10-CM | POA: Diagnosis not present

## 2016-11-14 DIAGNOSIS — N186 End stage renal disease: Secondary | ICD-10-CM | POA: Diagnosis not present

## 2016-11-14 DIAGNOSIS — N2581 Secondary hyperparathyroidism of renal origin: Secondary | ICD-10-CM | POA: Diagnosis not present

## 2016-11-14 DIAGNOSIS — Z992 Dependence on renal dialysis: Secondary | ICD-10-CM | POA: Diagnosis not present

## 2016-11-14 DIAGNOSIS — D509 Iron deficiency anemia, unspecified: Secondary | ICD-10-CM | POA: Diagnosis not present

## 2016-11-14 DIAGNOSIS — D631 Anemia in chronic kidney disease: Secondary | ICD-10-CM | POA: Diagnosis not present

## 2016-11-17 DIAGNOSIS — D509 Iron deficiency anemia, unspecified: Secondary | ICD-10-CM | POA: Diagnosis not present

## 2016-11-17 DIAGNOSIS — N2581 Secondary hyperparathyroidism of renal origin: Secondary | ICD-10-CM | POA: Diagnosis not present

## 2016-11-17 DIAGNOSIS — Z992 Dependence on renal dialysis: Secondary | ICD-10-CM | POA: Diagnosis not present

## 2016-11-17 DIAGNOSIS — D631 Anemia in chronic kidney disease: Secondary | ICD-10-CM | POA: Diagnosis not present

## 2016-11-17 DIAGNOSIS — N186 End stage renal disease: Secondary | ICD-10-CM | POA: Diagnosis not present

## 2016-11-19 DIAGNOSIS — N186 End stage renal disease: Secondary | ICD-10-CM | POA: Diagnosis not present

## 2016-11-19 DIAGNOSIS — D631 Anemia in chronic kidney disease: Secondary | ICD-10-CM | POA: Diagnosis not present

## 2016-11-19 DIAGNOSIS — Z992 Dependence on renal dialysis: Secondary | ICD-10-CM | POA: Diagnosis not present

## 2016-11-19 DIAGNOSIS — D509 Iron deficiency anemia, unspecified: Secondary | ICD-10-CM | POA: Diagnosis not present

## 2016-11-19 DIAGNOSIS — N2581 Secondary hyperparathyroidism of renal origin: Secondary | ICD-10-CM | POA: Diagnosis not present

## 2016-11-21 DIAGNOSIS — D631 Anemia in chronic kidney disease: Secondary | ICD-10-CM | POA: Diagnosis not present

## 2016-11-21 DIAGNOSIS — D509 Iron deficiency anemia, unspecified: Secondary | ICD-10-CM | POA: Diagnosis not present

## 2016-11-21 DIAGNOSIS — Z992 Dependence on renal dialysis: Secondary | ICD-10-CM | POA: Diagnosis not present

## 2016-11-21 DIAGNOSIS — N186 End stage renal disease: Secondary | ICD-10-CM | POA: Diagnosis not present

## 2016-11-21 DIAGNOSIS — N2581 Secondary hyperparathyroidism of renal origin: Secondary | ICD-10-CM | POA: Diagnosis not present

## 2016-11-24 DIAGNOSIS — N2581 Secondary hyperparathyroidism of renal origin: Secondary | ICD-10-CM | POA: Diagnosis not present

## 2016-11-24 DIAGNOSIS — D509 Iron deficiency anemia, unspecified: Secondary | ICD-10-CM | POA: Diagnosis not present

## 2016-11-24 DIAGNOSIS — D631 Anemia in chronic kidney disease: Secondary | ICD-10-CM | POA: Diagnosis not present

## 2016-11-24 DIAGNOSIS — Z992 Dependence on renal dialysis: Secondary | ICD-10-CM | POA: Diagnosis not present

## 2016-11-24 DIAGNOSIS — N186 End stage renal disease: Secondary | ICD-10-CM | POA: Diagnosis not present

## 2016-11-26 DIAGNOSIS — N186 End stage renal disease: Secondary | ICD-10-CM | POA: Diagnosis not present

## 2016-11-26 DIAGNOSIS — D631 Anemia in chronic kidney disease: Secondary | ICD-10-CM | POA: Diagnosis not present

## 2016-11-26 DIAGNOSIS — Z992 Dependence on renal dialysis: Secondary | ICD-10-CM | POA: Diagnosis not present

## 2016-11-26 DIAGNOSIS — D509 Iron deficiency anemia, unspecified: Secondary | ICD-10-CM | POA: Diagnosis not present

## 2016-11-26 DIAGNOSIS — N2581 Secondary hyperparathyroidism of renal origin: Secondary | ICD-10-CM | POA: Diagnosis not present

## 2016-11-28 DIAGNOSIS — N186 End stage renal disease: Secondary | ICD-10-CM | POA: Diagnosis not present

## 2016-11-28 DIAGNOSIS — D509 Iron deficiency anemia, unspecified: Secondary | ICD-10-CM | POA: Diagnosis not present

## 2016-11-28 DIAGNOSIS — D631 Anemia in chronic kidney disease: Secondary | ICD-10-CM | POA: Diagnosis not present

## 2016-11-28 DIAGNOSIS — Z992 Dependence on renal dialysis: Secondary | ICD-10-CM | POA: Diagnosis not present

## 2016-11-28 DIAGNOSIS — N2581 Secondary hyperparathyroidism of renal origin: Secondary | ICD-10-CM | POA: Diagnosis not present

## 2016-12-01 DIAGNOSIS — N186 End stage renal disease: Secondary | ICD-10-CM | POA: Diagnosis not present

## 2016-12-01 DIAGNOSIS — D509 Iron deficiency anemia, unspecified: Secondary | ICD-10-CM | POA: Diagnosis not present

## 2016-12-01 DIAGNOSIS — N2581 Secondary hyperparathyroidism of renal origin: Secondary | ICD-10-CM | POA: Diagnosis not present

## 2016-12-01 DIAGNOSIS — Z992 Dependence on renal dialysis: Secondary | ICD-10-CM | POA: Diagnosis not present

## 2016-12-01 DIAGNOSIS — D631 Anemia in chronic kidney disease: Secondary | ICD-10-CM | POA: Diagnosis not present

## 2016-12-03 DIAGNOSIS — N2581 Secondary hyperparathyroidism of renal origin: Secondary | ICD-10-CM | POA: Diagnosis not present

## 2016-12-03 DIAGNOSIS — D509 Iron deficiency anemia, unspecified: Secondary | ICD-10-CM | POA: Diagnosis not present

## 2016-12-03 DIAGNOSIS — D631 Anemia in chronic kidney disease: Secondary | ICD-10-CM | POA: Diagnosis not present

## 2016-12-03 DIAGNOSIS — N186 End stage renal disease: Secondary | ICD-10-CM | POA: Diagnosis not present

## 2016-12-03 DIAGNOSIS — I12 Hypertensive chronic kidney disease with 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: Secondary | ICD-10-CM | POA: Diagnosis not present

## 2016-12-03 DIAGNOSIS — Z992 Dependence on renal dialysis: Secondary | ICD-10-CM | POA: Diagnosis not present

## 2016-12-05 DIAGNOSIS — D631 Anemia in chronic kidney disease: Secondary | ICD-10-CM | POA: Diagnosis not present

## 2016-12-05 DIAGNOSIS — N2581 Secondary hyperparathyroidism of renal origin: Secondary | ICD-10-CM | POA: Diagnosis not present

## 2016-12-05 DIAGNOSIS — Z992 Dependence on renal dialysis: Secondary | ICD-10-CM | POA: Diagnosis not present

## 2016-12-05 DIAGNOSIS — N186 End stage renal disease: Secondary | ICD-10-CM | POA: Diagnosis not present

## 2016-12-05 DIAGNOSIS — D509 Iron deficiency anemia, unspecified: Secondary | ICD-10-CM | POA: Diagnosis not present

## 2016-12-08 DIAGNOSIS — N186 End stage renal disease: Secondary | ICD-10-CM | POA: Diagnosis not present

## 2016-12-08 DIAGNOSIS — Z992 Dependence on renal dialysis: Secondary | ICD-10-CM | POA: Diagnosis not present

## 2016-12-08 DIAGNOSIS — N2581 Secondary hyperparathyroidism of renal origin: Secondary | ICD-10-CM | POA: Diagnosis not present

## 2016-12-08 DIAGNOSIS — D509 Iron deficiency anemia, unspecified: Secondary | ICD-10-CM | POA: Diagnosis not present

## 2016-12-08 DIAGNOSIS — T82858D Stenosis of vascular prosthetic devices, implants and grafts, subsequent encounter: Secondary | ICD-10-CM | POA: Diagnosis not present

## 2016-12-08 DIAGNOSIS — D631 Anemia in chronic kidney disease: Secondary | ICD-10-CM | POA: Diagnosis not present

## 2016-12-10 DIAGNOSIS — D631 Anemia in chronic kidney disease: Secondary | ICD-10-CM | POA: Diagnosis not present

## 2016-12-10 DIAGNOSIS — D509 Iron deficiency anemia, unspecified: Secondary | ICD-10-CM | POA: Diagnosis not present

## 2016-12-10 DIAGNOSIS — N186 End stage renal disease: Secondary | ICD-10-CM | POA: Diagnosis not present

## 2016-12-10 DIAGNOSIS — N2581 Secondary hyperparathyroidism of renal origin: Secondary | ICD-10-CM | POA: Diagnosis not present

## 2016-12-10 DIAGNOSIS — Z992 Dependence on renal dialysis: Secondary | ICD-10-CM | POA: Diagnosis not present

## 2016-12-12 DIAGNOSIS — Z992 Dependence on renal dialysis: Secondary | ICD-10-CM | POA: Diagnosis not present

## 2016-12-12 DIAGNOSIS — N186 End stage renal disease: Secondary | ICD-10-CM | POA: Diagnosis not present

## 2016-12-12 DIAGNOSIS — D631 Anemia in chronic kidney disease: Secondary | ICD-10-CM | POA: Diagnosis not present

## 2016-12-12 DIAGNOSIS — N2581 Secondary hyperparathyroidism of renal origin: Secondary | ICD-10-CM | POA: Diagnosis not present

## 2016-12-12 DIAGNOSIS — D509 Iron deficiency anemia, unspecified: Secondary | ICD-10-CM | POA: Diagnosis not present

## 2016-12-15 DIAGNOSIS — D509 Iron deficiency anemia, unspecified: Secondary | ICD-10-CM | POA: Diagnosis not present

## 2016-12-15 DIAGNOSIS — D631 Anemia in chronic kidney disease: Secondary | ICD-10-CM | POA: Diagnosis not present

## 2016-12-15 DIAGNOSIS — Z992 Dependence on renal dialysis: Secondary | ICD-10-CM | POA: Diagnosis not present

## 2016-12-15 DIAGNOSIS — N2581 Secondary hyperparathyroidism of renal origin: Secondary | ICD-10-CM | POA: Diagnosis not present

## 2016-12-15 DIAGNOSIS — N186 End stage renal disease: Secondary | ICD-10-CM | POA: Diagnosis not present

## 2016-12-17 DIAGNOSIS — D631 Anemia in chronic kidney disease: Secondary | ICD-10-CM | POA: Diagnosis not present

## 2016-12-17 DIAGNOSIS — N2581 Secondary hyperparathyroidism of renal origin: Secondary | ICD-10-CM | POA: Diagnosis not present

## 2016-12-17 DIAGNOSIS — Z992 Dependence on renal dialysis: Secondary | ICD-10-CM | POA: Diagnosis not present

## 2016-12-17 DIAGNOSIS — D509 Iron deficiency anemia, unspecified: Secondary | ICD-10-CM | POA: Diagnosis not present

## 2016-12-17 DIAGNOSIS — N186 End stage renal disease: Secondary | ICD-10-CM | POA: Diagnosis not present

## 2016-12-21 DIAGNOSIS — N2581 Secondary hyperparathyroidism of renal origin: Secondary | ICD-10-CM | POA: Diagnosis not present

## 2016-12-21 DIAGNOSIS — D509 Iron deficiency anemia, unspecified: Secondary | ICD-10-CM | POA: Diagnosis not present

## 2016-12-21 DIAGNOSIS — Z992 Dependence on renal dialysis: Secondary | ICD-10-CM | POA: Diagnosis not present

## 2016-12-21 DIAGNOSIS — N186 End stage renal disease: Secondary | ICD-10-CM | POA: Diagnosis not present

## 2016-12-21 DIAGNOSIS — D631 Anemia in chronic kidney disease: Secondary | ICD-10-CM | POA: Diagnosis not present

## 2016-12-23 DIAGNOSIS — N2581 Secondary hyperparathyroidism of renal origin: Secondary | ICD-10-CM | POA: Diagnosis not present

## 2016-12-23 DIAGNOSIS — D631 Anemia in chronic kidney disease: Secondary | ICD-10-CM | POA: Diagnosis not present

## 2016-12-23 DIAGNOSIS — N186 End stage renal disease: Secondary | ICD-10-CM | POA: Diagnosis not present

## 2016-12-23 DIAGNOSIS — D509 Iron deficiency anemia, unspecified: Secondary | ICD-10-CM | POA: Diagnosis not present

## 2016-12-23 DIAGNOSIS — Z992 Dependence on renal dialysis: Secondary | ICD-10-CM | POA: Diagnosis not present

## 2016-12-26 DIAGNOSIS — Z992 Dependence on renal dialysis: Secondary | ICD-10-CM | POA: Diagnosis not present

## 2016-12-26 DIAGNOSIS — D509 Iron deficiency anemia, unspecified: Secondary | ICD-10-CM | POA: Diagnosis not present

## 2016-12-26 DIAGNOSIS — N2581 Secondary hyperparathyroidism of renal origin: Secondary | ICD-10-CM | POA: Diagnosis not present

## 2016-12-26 DIAGNOSIS — D631 Anemia in chronic kidney disease: Secondary | ICD-10-CM | POA: Diagnosis not present

## 2016-12-26 DIAGNOSIS — N186 End stage renal disease: Secondary | ICD-10-CM | POA: Diagnosis not present

## 2016-12-29 DIAGNOSIS — D509 Iron deficiency anemia, unspecified: Secondary | ICD-10-CM | POA: Diagnosis not present

## 2016-12-29 DIAGNOSIS — Z992 Dependence on renal dialysis: Secondary | ICD-10-CM | POA: Diagnosis not present

## 2016-12-29 DIAGNOSIS — D631 Anemia in chronic kidney disease: Secondary | ICD-10-CM | POA: Diagnosis not present

## 2016-12-29 DIAGNOSIS — N186 End stage renal disease: Secondary | ICD-10-CM | POA: Diagnosis not present

## 2016-12-29 DIAGNOSIS — N2581 Secondary hyperparathyroidism of renal origin: Secondary | ICD-10-CM | POA: Diagnosis not present

## 2016-12-31 DIAGNOSIS — N2581 Secondary hyperparathyroidism of renal origin: Secondary | ICD-10-CM | POA: Diagnosis not present

## 2016-12-31 DIAGNOSIS — Z992 Dependence on renal dialysis: Secondary | ICD-10-CM | POA: Diagnosis not present

## 2016-12-31 DIAGNOSIS — N186 End stage renal disease: Secondary | ICD-10-CM | POA: Diagnosis not present

## 2016-12-31 DIAGNOSIS — D631 Anemia in chronic kidney disease: Secondary | ICD-10-CM | POA: Diagnosis not present

## 2016-12-31 DIAGNOSIS — D509 Iron deficiency anemia, unspecified: Secondary | ICD-10-CM | POA: Diagnosis not present

## 2017-01-02 DIAGNOSIS — N186 End stage renal disease: Secondary | ICD-10-CM | POA: Diagnosis not present

## 2017-01-02 DIAGNOSIS — Z992 Dependence on renal dialysis: Secondary | ICD-10-CM | POA: Diagnosis not present

## 2017-01-02 DIAGNOSIS — I12 Hypertensive chronic kidney disease with 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: Secondary | ICD-10-CM | POA: Diagnosis not present

## 2017-01-05 DIAGNOSIS — D509 Iron deficiency anemia, unspecified: Secondary | ICD-10-CM | POA: Diagnosis not present

## 2017-01-05 DIAGNOSIS — Z992 Dependence on renal dialysis: Secondary | ICD-10-CM | POA: Diagnosis not present

## 2017-01-05 DIAGNOSIS — N2581 Secondary hyperparathyroidism of renal origin: Secondary | ICD-10-CM | POA: Diagnosis not present

## 2017-01-05 DIAGNOSIS — D631 Anemia in chronic kidney disease: Secondary | ICD-10-CM | POA: Diagnosis not present

## 2017-01-05 DIAGNOSIS — N186 End stage renal disease: Secondary | ICD-10-CM | POA: Diagnosis not present

## 2017-01-07 DIAGNOSIS — Z992 Dependence on renal dialysis: Secondary | ICD-10-CM | POA: Diagnosis not present

## 2017-01-07 DIAGNOSIS — N186 End stage renal disease: Secondary | ICD-10-CM | POA: Diagnosis not present

## 2017-01-07 DIAGNOSIS — D631 Anemia in chronic kidney disease: Secondary | ICD-10-CM | POA: Diagnosis not present

## 2017-01-07 DIAGNOSIS — D509 Iron deficiency anemia, unspecified: Secondary | ICD-10-CM | POA: Diagnosis not present

## 2017-01-07 DIAGNOSIS — N2581 Secondary hyperparathyroidism of renal origin: Secondary | ICD-10-CM | POA: Diagnosis not present

## 2017-01-09 DIAGNOSIS — N2581 Secondary hyperparathyroidism of renal origin: Secondary | ICD-10-CM | POA: Diagnosis not present

## 2017-01-09 DIAGNOSIS — N186 End stage renal disease: Secondary | ICD-10-CM | POA: Diagnosis not present

## 2017-01-09 DIAGNOSIS — D509 Iron deficiency anemia, unspecified: Secondary | ICD-10-CM | POA: Diagnosis not present

## 2017-01-09 DIAGNOSIS — Z992 Dependence on renal dialysis: Secondary | ICD-10-CM | POA: Diagnosis not present

## 2017-01-09 DIAGNOSIS — D631 Anemia in chronic kidney disease: Secondary | ICD-10-CM | POA: Diagnosis not present

## 2017-01-14 DIAGNOSIS — N2581 Secondary hyperparathyroidism of renal origin: Secondary | ICD-10-CM | POA: Diagnosis not present

## 2017-01-14 DIAGNOSIS — Z992 Dependence on renal dialysis: Secondary | ICD-10-CM | POA: Diagnosis not present

## 2017-01-14 DIAGNOSIS — D509 Iron deficiency anemia, unspecified: Secondary | ICD-10-CM | POA: Diagnosis not present

## 2017-01-14 DIAGNOSIS — N186 End stage renal disease: Secondary | ICD-10-CM | POA: Diagnosis not present

## 2017-01-14 DIAGNOSIS — D631 Anemia in chronic kidney disease: Secondary | ICD-10-CM | POA: Diagnosis not present

## 2017-01-16 DIAGNOSIS — N2581 Secondary hyperparathyroidism of renal origin: Secondary | ICD-10-CM | POA: Diagnosis not present

## 2017-01-16 DIAGNOSIS — D631 Anemia in chronic kidney disease: Secondary | ICD-10-CM | POA: Diagnosis not present

## 2017-01-16 DIAGNOSIS — D509 Iron deficiency anemia, unspecified: Secondary | ICD-10-CM | POA: Diagnosis not present

## 2017-01-16 DIAGNOSIS — N186 End stage renal disease: Secondary | ICD-10-CM | POA: Diagnosis not present

## 2017-01-16 DIAGNOSIS — Z992 Dependence on renal dialysis: Secondary | ICD-10-CM | POA: Diagnosis not present

## 2017-01-19 DIAGNOSIS — D509 Iron deficiency anemia, unspecified: Secondary | ICD-10-CM | POA: Diagnosis not present

## 2017-01-19 DIAGNOSIS — N186 End stage renal disease: Secondary | ICD-10-CM | POA: Diagnosis not present

## 2017-01-19 DIAGNOSIS — D631 Anemia in chronic kidney disease: Secondary | ICD-10-CM | POA: Diagnosis not present

## 2017-01-19 DIAGNOSIS — Z992 Dependence on renal dialysis: Secondary | ICD-10-CM | POA: Diagnosis not present

## 2017-01-19 DIAGNOSIS — N2581 Secondary hyperparathyroidism of renal origin: Secondary | ICD-10-CM | POA: Diagnosis not present

## 2017-01-21 DIAGNOSIS — Z992 Dependence on renal dialysis: Secondary | ICD-10-CM | POA: Diagnosis not present

## 2017-01-21 DIAGNOSIS — D631 Anemia in chronic kidney disease: Secondary | ICD-10-CM | POA: Diagnosis not present

## 2017-01-21 DIAGNOSIS — D509 Iron deficiency anemia, unspecified: Secondary | ICD-10-CM | POA: Diagnosis not present

## 2017-01-21 DIAGNOSIS — N2581 Secondary hyperparathyroidism of renal origin: Secondary | ICD-10-CM | POA: Diagnosis not present

## 2017-01-21 DIAGNOSIS — N186 End stage renal disease: Secondary | ICD-10-CM | POA: Diagnosis not present

## 2017-01-23 DIAGNOSIS — D509 Iron deficiency anemia, unspecified: Secondary | ICD-10-CM | POA: Diagnosis not present

## 2017-01-23 DIAGNOSIS — D631 Anemia in chronic kidney disease: Secondary | ICD-10-CM | POA: Diagnosis not present

## 2017-01-23 DIAGNOSIS — N2581 Secondary hyperparathyroidism of renal origin: Secondary | ICD-10-CM | POA: Diagnosis not present

## 2017-01-23 DIAGNOSIS — N186 End stage renal disease: Secondary | ICD-10-CM | POA: Diagnosis not present

## 2017-01-23 DIAGNOSIS — Z992 Dependence on renal dialysis: Secondary | ICD-10-CM | POA: Diagnosis not present

## 2017-01-28 DIAGNOSIS — D631 Anemia in chronic kidney disease: Secondary | ICD-10-CM | POA: Diagnosis not present

## 2017-01-28 DIAGNOSIS — N186 End stage renal disease: Secondary | ICD-10-CM | POA: Diagnosis not present

## 2017-01-28 DIAGNOSIS — D509 Iron deficiency anemia, unspecified: Secondary | ICD-10-CM | POA: Diagnosis not present

## 2017-01-28 DIAGNOSIS — N2581 Secondary hyperparathyroidism of renal origin: Secondary | ICD-10-CM | POA: Diagnosis not present

## 2017-01-28 DIAGNOSIS — Z992 Dependence on renal dialysis: Secondary | ICD-10-CM | POA: Diagnosis not present

## 2017-01-30 DIAGNOSIS — Z992 Dependence on renal dialysis: Secondary | ICD-10-CM | POA: Diagnosis not present

## 2017-01-30 DIAGNOSIS — D631 Anemia in chronic kidney disease: Secondary | ICD-10-CM | POA: Diagnosis not present

## 2017-01-30 DIAGNOSIS — N2581 Secondary hyperparathyroidism of renal origin: Secondary | ICD-10-CM | POA: Diagnosis not present

## 2017-01-30 DIAGNOSIS — D509 Iron deficiency anemia, unspecified: Secondary | ICD-10-CM | POA: Diagnosis not present

## 2017-01-30 DIAGNOSIS — N186 End stage renal disease: Secondary | ICD-10-CM | POA: Diagnosis not present

## 2017-02-02 DIAGNOSIS — I12 Hypertensive chronic kidney disease with 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: Secondary | ICD-10-CM | POA: Diagnosis not present

## 2017-02-02 DIAGNOSIS — N186 End stage renal disease: Secondary | ICD-10-CM | POA: Diagnosis not present

## 2017-02-02 DIAGNOSIS — Z992 Dependence on renal dialysis: Secondary | ICD-10-CM | POA: Diagnosis not present

## 2017-02-04 DIAGNOSIS — D509 Iron deficiency anemia, unspecified: Secondary | ICD-10-CM | POA: Diagnosis not present

## 2017-02-04 DIAGNOSIS — N186 End stage renal disease: Secondary | ICD-10-CM | POA: Diagnosis not present

## 2017-02-04 DIAGNOSIS — Z992 Dependence on renal dialysis: Secondary | ICD-10-CM | POA: Diagnosis not present

## 2017-02-04 DIAGNOSIS — D631 Anemia in chronic kidney disease: Secondary | ICD-10-CM | POA: Diagnosis not present

## 2017-02-04 DIAGNOSIS — N2581 Secondary hyperparathyroidism of renal origin: Secondary | ICD-10-CM | POA: Diagnosis not present

## 2017-02-04 DIAGNOSIS — D689 Coagulation defect, unspecified: Secondary | ICD-10-CM | POA: Diagnosis not present

## 2017-02-06 DIAGNOSIS — D689 Coagulation defect, unspecified: Secondary | ICD-10-CM | POA: Diagnosis not present

## 2017-02-06 DIAGNOSIS — Z992 Dependence on renal dialysis: Secondary | ICD-10-CM | POA: Diagnosis not present

## 2017-02-06 DIAGNOSIS — D631 Anemia in chronic kidney disease: Secondary | ICD-10-CM | POA: Diagnosis not present

## 2017-02-06 DIAGNOSIS — N186 End stage renal disease: Secondary | ICD-10-CM | POA: Diagnosis not present

## 2017-02-06 DIAGNOSIS — N2581 Secondary hyperparathyroidism of renal origin: Secondary | ICD-10-CM | POA: Diagnosis not present

## 2017-02-06 DIAGNOSIS — D509 Iron deficiency anemia, unspecified: Secondary | ICD-10-CM | POA: Diagnosis not present

## 2017-02-09 DIAGNOSIS — N186 End stage renal disease: Secondary | ICD-10-CM | POA: Diagnosis not present

## 2017-02-09 DIAGNOSIS — N2581 Secondary hyperparathyroidism of renal origin: Secondary | ICD-10-CM | POA: Diagnosis not present

## 2017-02-09 DIAGNOSIS — D689 Coagulation defect, unspecified: Secondary | ICD-10-CM | POA: Diagnosis not present

## 2017-02-09 DIAGNOSIS — Z992 Dependence on renal dialysis: Secondary | ICD-10-CM | POA: Diagnosis not present

## 2017-02-09 DIAGNOSIS — D631 Anemia in chronic kidney disease: Secondary | ICD-10-CM | POA: Diagnosis not present

## 2017-02-09 DIAGNOSIS — D509 Iron deficiency anemia, unspecified: Secondary | ICD-10-CM | POA: Diagnosis not present

## 2017-02-11 DIAGNOSIS — N2581 Secondary hyperparathyroidism of renal origin: Secondary | ICD-10-CM | POA: Diagnosis not present

## 2017-02-11 DIAGNOSIS — Z992 Dependence on renal dialysis: Secondary | ICD-10-CM | POA: Diagnosis not present

## 2017-02-11 DIAGNOSIS — D689 Coagulation defect, unspecified: Secondary | ICD-10-CM | POA: Diagnosis not present

## 2017-02-11 DIAGNOSIS — D631 Anemia in chronic kidney disease: Secondary | ICD-10-CM | POA: Diagnosis not present

## 2017-02-11 DIAGNOSIS — D509 Iron deficiency anemia, unspecified: Secondary | ICD-10-CM | POA: Diagnosis not present

## 2017-02-11 DIAGNOSIS — N186 End stage renal disease: Secondary | ICD-10-CM | POA: Diagnosis not present

## 2017-02-13 DIAGNOSIS — N186 End stage renal disease: Secondary | ICD-10-CM | POA: Diagnosis not present

## 2017-02-13 DIAGNOSIS — D631 Anemia in chronic kidney disease: Secondary | ICD-10-CM | POA: Diagnosis not present

## 2017-02-13 DIAGNOSIS — N2581 Secondary hyperparathyroidism of renal origin: Secondary | ICD-10-CM | POA: Diagnosis not present

## 2017-02-13 DIAGNOSIS — D689 Coagulation defect, unspecified: Secondary | ICD-10-CM | POA: Diagnosis not present

## 2017-02-13 DIAGNOSIS — Z992 Dependence on renal dialysis: Secondary | ICD-10-CM | POA: Diagnosis not present

## 2017-02-13 DIAGNOSIS — D509 Iron deficiency anemia, unspecified: Secondary | ICD-10-CM | POA: Diagnosis not present

## 2017-02-16 DIAGNOSIS — N186 End stage renal disease: Secondary | ICD-10-CM | POA: Diagnosis not present

## 2017-02-16 DIAGNOSIS — Z992 Dependence on renal dialysis: Secondary | ICD-10-CM | POA: Diagnosis not present

## 2017-02-16 DIAGNOSIS — D631 Anemia in chronic kidney disease: Secondary | ICD-10-CM | POA: Diagnosis not present

## 2017-02-16 DIAGNOSIS — N2581 Secondary hyperparathyroidism of renal origin: Secondary | ICD-10-CM | POA: Diagnosis not present

## 2017-02-16 DIAGNOSIS — D689 Coagulation defect, unspecified: Secondary | ICD-10-CM | POA: Diagnosis not present

## 2017-02-16 DIAGNOSIS — D509 Iron deficiency anemia, unspecified: Secondary | ICD-10-CM | POA: Diagnosis not present

## 2017-02-18 DIAGNOSIS — D631 Anemia in chronic kidney disease: Secondary | ICD-10-CM | POA: Diagnosis not present

## 2017-02-18 DIAGNOSIS — D509 Iron deficiency anemia, unspecified: Secondary | ICD-10-CM | POA: Diagnosis not present

## 2017-02-18 DIAGNOSIS — N186 End stage renal disease: Secondary | ICD-10-CM | POA: Diagnosis not present

## 2017-02-18 DIAGNOSIS — Z992 Dependence on renal dialysis: Secondary | ICD-10-CM | POA: Diagnosis not present

## 2017-02-18 DIAGNOSIS — D689 Coagulation defect, unspecified: Secondary | ICD-10-CM | POA: Diagnosis not present

## 2017-02-18 DIAGNOSIS — N2581 Secondary hyperparathyroidism of renal origin: Secondary | ICD-10-CM | POA: Diagnosis not present

## 2017-02-20 DIAGNOSIS — N2581 Secondary hyperparathyroidism of renal origin: Secondary | ICD-10-CM | POA: Diagnosis not present

## 2017-02-20 DIAGNOSIS — N186 End stage renal disease: Secondary | ICD-10-CM | POA: Diagnosis not present

## 2017-02-20 DIAGNOSIS — D509 Iron deficiency anemia, unspecified: Secondary | ICD-10-CM | POA: Diagnosis not present

## 2017-02-20 DIAGNOSIS — Z992 Dependence on renal dialysis: Secondary | ICD-10-CM | POA: Diagnosis not present

## 2017-02-20 DIAGNOSIS — D631 Anemia in chronic kidney disease: Secondary | ICD-10-CM | POA: Diagnosis not present

## 2017-02-20 DIAGNOSIS — D689 Coagulation defect, unspecified: Secondary | ICD-10-CM | POA: Diagnosis not present

## 2017-02-23 DIAGNOSIS — N186 End stage renal disease: Secondary | ICD-10-CM | POA: Diagnosis not present

## 2017-02-23 DIAGNOSIS — N2581 Secondary hyperparathyroidism of renal origin: Secondary | ICD-10-CM | POA: Diagnosis not present

## 2017-02-23 DIAGNOSIS — D631 Anemia in chronic kidney disease: Secondary | ICD-10-CM | POA: Diagnosis not present

## 2017-02-23 DIAGNOSIS — D689 Coagulation defect, unspecified: Secondary | ICD-10-CM | POA: Diagnosis not present

## 2017-02-23 DIAGNOSIS — Z992 Dependence on renal dialysis: Secondary | ICD-10-CM | POA: Diagnosis not present

## 2017-02-23 DIAGNOSIS — D509 Iron deficiency anemia, unspecified: Secondary | ICD-10-CM | POA: Diagnosis not present

## 2017-02-25 DIAGNOSIS — D509 Iron deficiency anemia, unspecified: Secondary | ICD-10-CM | POA: Diagnosis not present

## 2017-02-25 DIAGNOSIS — Z992 Dependence on renal dialysis: Secondary | ICD-10-CM | POA: Diagnosis not present

## 2017-02-25 DIAGNOSIS — D689 Coagulation defect, unspecified: Secondary | ICD-10-CM | POA: Diagnosis not present

## 2017-02-25 DIAGNOSIS — N2581 Secondary hyperparathyroidism of renal origin: Secondary | ICD-10-CM | POA: Diagnosis not present

## 2017-02-25 DIAGNOSIS — N186 End stage renal disease: Secondary | ICD-10-CM | POA: Diagnosis not present

## 2017-02-25 DIAGNOSIS — D631 Anemia in chronic kidney disease: Secondary | ICD-10-CM | POA: Diagnosis not present

## 2017-02-27 DIAGNOSIS — N2581 Secondary hyperparathyroidism of renal origin: Secondary | ICD-10-CM | POA: Diagnosis not present

## 2017-02-27 DIAGNOSIS — D509 Iron deficiency anemia, unspecified: Secondary | ICD-10-CM | POA: Diagnosis not present

## 2017-02-27 DIAGNOSIS — Z992 Dependence on renal dialysis: Secondary | ICD-10-CM | POA: Diagnosis not present

## 2017-02-27 DIAGNOSIS — D631 Anemia in chronic kidney disease: Secondary | ICD-10-CM | POA: Diagnosis not present

## 2017-02-27 DIAGNOSIS — N186 End stage renal disease: Secondary | ICD-10-CM | POA: Diagnosis not present

## 2017-02-27 DIAGNOSIS — D689 Coagulation defect, unspecified: Secondary | ICD-10-CM | POA: Diagnosis not present

## 2017-03-02 DIAGNOSIS — D509 Iron deficiency anemia, unspecified: Secondary | ICD-10-CM | POA: Diagnosis not present

## 2017-03-02 DIAGNOSIS — N2581 Secondary hyperparathyroidism of renal origin: Secondary | ICD-10-CM | POA: Diagnosis not present

## 2017-03-02 DIAGNOSIS — Z992 Dependence on renal dialysis: Secondary | ICD-10-CM | POA: Diagnosis not present

## 2017-03-02 DIAGNOSIS — D631 Anemia in chronic kidney disease: Secondary | ICD-10-CM | POA: Diagnosis not present

## 2017-03-02 DIAGNOSIS — D689 Coagulation defect, unspecified: Secondary | ICD-10-CM | POA: Diagnosis not present

## 2017-03-02 DIAGNOSIS — N186 End stage renal disease: Secondary | ICD-10-CM | POA: Diagnosis not present

## 2017-03-04 DIAGNOSIS — D509 Iron deficiency anemia, unspecified: Secondary | ICD-10-CM | POA: Diagnosis not present

## 2017-03-04 DIAGNOSIS — Z992 Dependence on renal dialysis: Secondary | ICD-10-CM | POA: Diagnosis not present

## 2017-03-04 DIAGNOSIS — D631 Anemia in chronic kidney disease: Secondary | ICD-10-CM | POA: Diagnosis not present

## 2017-03-04 DIAGNOSIS — D689 Coagulation defect, unspecified: Secondary | ICD-10-CM | POA: Diagnosis not present

## 2017-03-04 DIAGNOSIS — N186 End stage renal disease: Secondary | ICD-10-CM | POA: Diagnosis not present

## 2017-03-04 DIAGNOSIS — N2581 Secondary hyperparathyroidism of renal origin: Secondary | ICD-10-CM | POA: Diagnosis not present

## 2017-03-05 DIAGNOSIS — N186 End stage renal disease: Secondary | ICD-10-CM | POA: Diagnosis not present

## 2017-03-05 DIAGNOSIS — T8249XA Other complication of vascular dialysis catheter, initial encounter: Secondary | ICD-10-CM | POA: Diagnosis not present

## 2017-03-05 DIAGNOSIS — Z992 Dependence on renal dialysis: Secondary | ICD-10-CM | POA: Diagnosis not present

## 2017-03-05 DIAGNOSIS — I12 Hypertensive chronic kidney disease with 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: Secondary | ICD-10-CM | POA: Diagnosis not present

## 2017-03-06 DIAGNOSIS — Z992 Dependence on renal dialysis: Secondary | ICD-10-CM | POA: Diagnosis not present

## 2017-03-06 DIAGNOSIS — J189 Pneumonia, unspecified organism: Secondary | ICD-10-CM

## 2017-03-06 DIAGNOSIS — Z8619 Personal history of other infectious and parasitic diseases: Secondary | ICD-10-CM

## 2017-03-06 DIAGNOSIS — N186 End stage renal disease: Secondary | ICD-10-CM | POA: Diagnosis not present

## 2017-03-06 DIAGNOSIS — I12 Hypertensive chronic kidney disease with 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: Secondary | ICD-10-CM | POA: Diagnosis not present

## 2017-03-06 HISTORY — DX: Pneumonia, unspecified organism: J18.9

## 2017-03-06 HISTORY — DX: Personal history of other infectious and parasitic diseases: Z86.19

## 2017-03-08 ENCOUNTER — Inpatient Hospital Stay (HOSPITAL_COMMUNITY)
Admission: EM | Admit: 2017-03-08 | Discharge: 2017-03-17 | DRG: 871 | Disposition: A | Discharge status: Home / Self Care | Payer: Medicare Other | Attending: Family Medicine | Admitting: Family Medicine

## 2017-03-08 ENCOUNTER — Other Ambulatory Visit: Payer: Self-pay

## 2017-03-08 ENCOUNTER — Encounter (HOSPITAL_COMMUNITY): Payer: Self-pay

## 2017-03-08 ENCOUNTER — Emergency Department (HOSPITAL_COMMUNITY): Payer: Medicare Other

## 2017-03-08 DIAGNOSIS — J181 Lobar pneumonia, unspecified organism: Secondary | ICD-10-CM

## 2017-03-08 DIAGNOSIS — D631 Anemia in chronic kidney disease: Secondary | ICD-10-CM | POA: Diagnosis not present

## 2017-03-08 DIAGNOSIS — Z825 Family history of asthma and other chronic lower respiratory diseases: Secondary | ICD-10-CM

## 2017-03-08 DIAGNOSIS — Y95 Nosocomial condition: Secondary | ICD-10-CM | POA: Diagnosis present

## 2017-03-08 DIAGNOSIS — N186 End stage renal disease: Secondary | ICD-10-CM | POA: Diagnosis not present

## 2017-03-08 DIAGNOSIS — R112 Nausea with vomiting, unspecified: Secondary | ICD-10-CM | POA: Diagnosis present

## 2017-03-08 DIAGNOSIS — K047 Periapical abscess without sinus: Secondary | ICD-10-CM | POA: Diagnosis not present

## 2017-03-08 DIAGNOSIS — R0602 Shortness of breath: Secondary | ICD-10-CM

## 2017-03-08 DIAGNOSIS — Z79899 Other long term (current) drug therapy: Secondary | ICD-10-CM

## 2017-03-08 DIAGNOSIS — K921 Melena: Secondary | ICD-10-CM | POA: Diagnosis present

## 2017-03-08 DIAGNOSIS — J690 Pneumonitis due to inhalation of food and vomit: Secondary | ICD-10-CM

## 2017-03-08 DIAGNOSIS — J189 Pneumonia, unspecified organism: Secondary | ICD-10-CM | POA: Diagnosis present

## 2017-03-08 DIAGNOSIS — I160 Hypertensive urgency: Secondary | ICD-10-CM | POA: Diagnosis present

## 2017-03-08 DIAGNOSIS — K219 Gastro-esophageal reflux disease without esophagitis: Secondary | ICD-10-CM | POA: Diagnosis not present

## 2017-03-08 DIAGNOSIS — Z992 Dependence on renal dialysis: Secondary | ICD-10-CM

## 2017-03-08 DIAGNOSIS — T40605A Adverse effect of unspecified narcotics, initial encounter: Secondary | ICD-10-CM | POA: Diagnosis present

## 2017-03-08 DIAGNOSIS — K5900 Constipation, unspecified: Secondary | ICD-10-CM | POA: Diagnosis present

## 2017-03-08 DIAGNOSIS — N189 Chronic kidney disease, unspecified: Secondary | ICD-10-CM | POA: Diagnosis present

## 2017-03-08 DIAGNOSIS — J440 Chronic obstructive pulmonary disease with acute lower respiratory infection: Secondary | ICD-10-CM | POA: Diagnosis present

## 2017-03-08 DIAGNOSIS — D630 Anemia in neoplastic disease: Secondary | ICD-10-CM | POA: Diagnosis present

## 2017-03-08 DIAGNOSIS — M1990 Unspecified osteoarthritis, unspecified site: Secondary | ICD-10-CM | POA: Diagnosis present

## 2017-03-08 DIAGNOSIS — Z87891 Personal history of nicotine dependence: Secondary | ICD-10-CM

## 2017-03-08 DIAGNOSIS — R1110 Vomiting, unspecified: Secondary | ICD-10-CM | POA: Diagnosis not present

## 2017-03-08 DIAGNOSIS — K7469 Other cirrhosis of liver: Secondary | ICD-10-CM | POA: Diagnosis present

## 2017-03-08 DIAGNOSIS — A419 Sepsis, unspecified organism: Principal | ICD-10-CM | POA: Diagnosis present

## 2017-03-08 DIAGNOSIS — K7460 Unspecified cirrhosis of liver: Secondary | ICD-10-CM

## 2017-03-08 DIAGNOSIS — D539 Nutritional anemia, unspecified: Secondary | ICD-10-CM | POA: Diagnosis present

## 2017-03-08 DIAGNOSIS — Z886 Allergy status to analgesic agent status: Secondary | ICD-10-CM

## 2017-03-08 DIAGNOSIS — R509 Fever, unspecified: Secondary | ICD-10-CM | POA: Diagnosis not present

## 2017-03-08 DIAGNOSIS — R1032 Left lower quadrant pain: Secondary | ICD-10-CM

## 2017-03-08 DIAGNOSIS — E875 Hyperkalemia: Secondary | ICD-10-CM | POA: Diagnosis not present

## 2017-03-08 DIAGNOSIS — D62 Acute posthemorrhagic anemia: Secondary | ICD-10-CM | POA: Diagnosis not present

## 2017-03-08 DIAGNOSIS — G4730 Sleep apnea, unspecified: Secondary | ICD-10-CM | POA: Diagnosis present

## 2017-03-08 DIAGNOSIS — I2510 Atherosclerotic heart disease of native coronary artery without angina pectoris: Secondary | ICD-10-CM | POA: Diagnosis present

## 2017-03-08 DIAGNOSIS — I1311 Hypertensive heart and chronic kidney disease without heart failure, with stage 5 chronic kidney disease, or end stage renal disease: Secondary | ICD-10-CM | POA: Diagnosis present

## 2017-03-08 DIAGNOSIS — R6889 Other general symptoms and signs: Secondary | ICD-10-CM | POA: Diagnosis present

## 2017-03-08 DIAGNOSIS — K644 Residual hemorrhoidal skin tags: Secondary | ICD-10-CM | POA: Diagnosis present

## 2017-03-08 DIAGNOSIS — R1084 Generalized abdominal pain: Secondary | ICD-10-CM | POA: Diagnosis not present

## 2017-03-08 DIAGNOSIS — N281 Cyst of kidney, acquired: Secondary | ICD-10-CM | POA: Diagnosis present

## 2017-03-08 DIAGNOSIS — N2581 Secondary hyperparathyroidism of renal origin: Secondary | ICD-10-CM | POA: Diagnosis present

## 2017-03-08 DIAGNOSIS — Z8249 Family history of ischemic heart disease and other diseases of the circulatory system: Secondary | ICD-10-CM

## 2017-03-08 HISTORY — DX: Pneumonia, unspecified organism: J18.9

## 2017-03-08 LAB — LACTIC ACID, PLASMA: Lactic Acid, Venous: 2 mmol/L (ref 0.5–1.9)

## 2017-03-08 LAB — COMPREHENSIVE METABOLIC PANEL
ALT: 9 U/L — AB (ref 17–63)
AST: 18 U/L (ref 15–41)
Albumin: 3.4 g/dL — ABNORMAL LOW (ref 3.5–5.0)
Alkaline Phosphatase: 49 U/L (ref 38–126)
Anion gap: 22 — ABNORMAL HIGH (ref 5–15)
BILIRUBIN TOTAL: 0.8 mg/dL (ref 0.3–1.2)
BUN: 68 mg/dL — ABNORMAL HIGH (ref 6–20)
CHLORIDE: 92 mmol/L — AB (ref 101–111)
CO2: 20 mmol/L — ABNORMAL LOW (ref 22–32)
CREATININE: 20.39 mg/dL — AB (ref 0.61–1.24)
Calcium: 8.7 mg/dL — ABNORMAL LOW (ref 8.9–10.3)
GFR calc non Af Amer: 2 mL/min — ABNORMAL LOW (ref >60)
GFR, EST AFRICAN AMERICAN: 3 mL/min — AB (ref >60)
Glucose, Bld: 79 mg/dL (ref 65–99)
POTASSIUM: 6.7 mmol/L — AB (ref 3.5–5.1)
Sodium: 134 mmol/L — ABNORMAL LOW (ref 135–145)
TOTAL PROTEIN: 6.9 g/dL (ref 6.5–8.1)

## 2017-03-08 LAB — CBC WITH DIFFERENTIAL/PLATELET
BASOS ABS: 0 10*3/uL (ref 0.0–0.1)
Basophils Relative: 0 %
EOS PCT: 1 %
Eosinophils Absolute: 0.1 10*3/uL (ref 0.0–0.7)
HCT: 31.4 % — ABNORMAL LOW (ref 39.0–52.0)
HEMOGLOBIN: 10.4 g/dL — AB (ref 13.0–17.0)
LYMPHS ABS: 1.1 10*3/uL (ref 0.7–4.0)
LYMPHS PCT: 12 %
MCH: 33.3 pg (ref 26.0–34.0)
MCHC: 33.1 g/dL (ref 30.0–36.0)
MCV: 100.6 fL — AB (ref 78.0–100.0)
Monocytes Absolute: 0.7 10*3/uL (ref 0.1–1.0)
Monocytes Relative: 7 %
NEUTROS PCT: 80 %
Neutro Abs: 7.3 10*3/uL (ref 1.7–7.7)
PLATELETS: 219 10*3/uL (ref 150–400)
RBC: 3.12 MIL/uL — AB (ref 4.22–5.81)
RDW: 15.5 % (ref 11.5–15.5)
WBC: 9.2 10*3/uL (ref 4.0–10.5)

## 2017-03-08 LAB — URINALYSIS, ROUTINE W REFLEX MICROSCOPIC
Bilirubin Urine: NEGATIVE
Glucose, UA: NEGATIVE mg/dL
KETONES UR: NEGATIVE mg/dL
LEUKOCYTES UA: NEGATIVE
Nitrite: NEGATIVE
PROTEIN: NEGATIVE mg/dL
Specific Gravity, Urine: 1.008 (ref 1.005–1.030)
pH: 9 — ABNORMAL HIGH (ref 5.0–8.0)

## 2017-03-08 LAB — I-STAT CG4 LACTIC ACID, ED: LACTIC ACID, VENOUS: 1.06 mmol/L (ref 0.5–1.9)

## 2017-03-08 MED ORDER — FENTANYL CITRATE (PF) 100 MCG/2ML IJ SOLN
50.0000 ug | Freq: Once | INTRAMUSCULAR | Status: AC
  Administered 2017-03-08: 50 ug via INTRAVENOUS
  Filled 2017-03-08: qty 2

## 2017-03-08 MED ORDER — INSULIN ASPART 100 UNIT/ML ~~LOC~~ SOLN
5.0000 [IU] | Freq: Once | SUBCUTANEOUS | Status: AC
  Administered 2017-03-09: 5 [IU] via INTRAVENOUS
  Filled 2017-03-08: qty 1

## 2017-03-08 MED ORDER — ACETAMINOPHEN 325 MG PO TABS
650.0000 mg | ORAL_TABLET | Freq: Once | ORAL | Status: AC | PRN
  Administered 2017-03-08: 650 mg via ORAL
  Filled 2017-03-08: qty 2

## 2017-03-08 MED ORDER — SODIUM POLYSTYRENE SULFONATE 15 GM/60ML PO SUSP
30.0000 g | Freq: Once | ORAL | Status: AC
  Administered 2017-03-09: 30 g via ORAL
  Filled 2017-03-08: qty 120

## 2017-03-08 MED ORDER — INSULIN ASPART 100 UNIT/ML IV SOLN: 5.0000 [IU] | Freq: Once | INTRAVENOUS | Status: DC

## 2017-03-08 MED ORDER — ACETAMINOPHEN 325 MG PO TABS
650.0000 mg | ORAL_TABLET | Freq: Once | ORAL | Status: DC
  Filled 2017-03-08: qty 2

## 2017-03-08 MED ORDER — ONDANSETRON HCL 4 MG/2ML IJ SOLN
4.0000 mg | Freq: Once | INTRAMUSCULAR | Status: AC
  Administered 2017-03-08: 4 mg via INTRAVENOUS
  Filled 2017-03-08: qty 2

## 2017-03-08 MED ORDER — OXYCODONE HCL 5 MG PO TABS: 5.0000 mg | ORAL_TABLET | Freq: Once | ORAL | Status: DC

## 2017-03-08 MED ORDER — HYDROMORPHONE HCL 1 MG/ML IJ SOLN
0.5000 mg | Freq: Once | INTRAMUSCULAR | Status: AC
Start: 2017-03-08 — End: 2017-03-08
  Administered 2017-03-08: 0.5 mg via INTRAVENOUS
  Filled 2017-03-08: qty 1

## 2017-03-08 MED ORDER — DEXTROSE 50 % IV SOLN
1.0000 | Freq: Once | INTRAVENOUS | Status: AC
  Administered 2017-03-09: 50 mL via INTRAVENOUS
  Filled 2017-03-08: qty 50

## 2017-03-08 MED ORDER — DEXTROSE 5 % IV SOLN
1.0000 g | INTRAVENOUS | Status: DC
  Administered 2017-03-08 – 2017-03-11 (×4): 1 g via INTRAVENOUS
  Filled 2017-03-08 (×6): qty 1

## 2017-03-08 MED ORDER — ALBUTEROL SULFATE (2.5 MG/3ML) 0.083% IN NEBU: 10.0000 mg | INHALATION_SOLUTION | Freq: Once | RESPIRATORY_TRACT | Status: DC

## 2017-03-08 MED ORDER — VANCOMYCIN HCL 10 G IV SOLR
1750.0000 mg | Freq: Once | INTRAVENOUS | Status: AC
  Administered 2017-03-08: 1750 mg via INTRAVENOUS
  Filled 2017-03-08: qty 1750

## 2017-03-08 MED ORDER — IOPAMIDOL (ISOVUE-300) INJECTION 61%
INTRAVENOUS | Status: AC
  Filled 2017-03-08: qty 30

## 2017-03-08 NOTE — ED Notes (Signed)
Patient transported to CT 

## 2017-03-08 NOTE — ED Provider Notes (Signed)
[facility] EMERGENCY DEPARTMENT Provider Note   CSN: [id number] Arrival date & time: 03/08/17  1300     History   Chief Complaint No chief complaint on file.   HPI James Wall is a 52 y.o. male.   Fever   This is a new problem. The current episode started 3 to 5 hours ago. The problem occurs constantly. The problem has not changed since onset.The maximum temperature noted was 102 to 102.9 F. Associated symptoms include vomiting. Pertinent negatives include no chest pain, no congestion, no headaches, no sore throat, no muscle aches and no cough. He has tried nothing for the symptoms.  Abdominal Pain   This is a new problem. The pain is located in the generalized abdominal region. The quality of the pain is aching. Associated symptoms include fever, nausea and vomiting. Pertinent negatives include dysuria, hematuria, headaches and arthralgias. Associated symptoms comments: Bright red blood in bowel movement x1 today..    Past Medical History:  Diagnosis Date  . Anemia   . Anxiety   . Arthritis    GOUT - pt not sure if this is true  . AV fistula occlusion ([place]) 10/2015  . Cirrhosis, nonalcoholic ([place])   . COPD (chronic obstructive pulmonary disease) ([place])   . Depression   . ESRD on hemodialysis [facility])    Started HD Jan 2009.  ESRD was due to HTN.  Dx'd with HTN in hospital 1996 according to pt, they had to keep him so he could get Medicaid to afford the BP medications.  First saw a nephrologist and started HD in the same year 2009.  Gets HD at [facility] on a MWF schedule.  Does not have DM. He had a left RC AVF that never functioned, a left upper arm AVF that worked for about 5 years and as of June  . GERD (gastroesophageal reflux disease)   . Hypertension   . Renal insufficiency   . Secondary hyperparathyroidism ([place])   . Sepsis ([place]) 02/2013   from AVF , treated with Vancomycin.  Marland Kitchen Shortness of breath    With exertion  . Sleep apnea    no  longer  using cpap    Patient Active Problem List   Diagnosis Date Noted  . Complication of AV dialysis fistula 10/22/2015  . ESRD on dialysis ([place]) 10/22/2015  . Elevated d-dimer 07/12/2013  . Abnormal cardiovascular function study 07/12/2013  . HCAP (healthcare-associated pneumonia) 07/09/2013  . Chest pain at rest 07/04/2013  . Abnormal stress test 06/22/2013  . GERD (gastroesophageal reflux disease) 12/21/2012  . Anemia of renal disease 12/21/2012  . Essential hypertension 04/06/2008  . ASTHMA 04/06/2008  . SLEEP APNEA 04/06/2008    Past Surgical History:  Procedure Laterality Date  . AV FISTULA PLACEMENT  2009   Left lower arm AVF  . AV FISTULA PLACEMENT Right 02/22/2013   Procedure:  CREATION  OF BRACHIAL CEPHALIC FISTULA RIGHT ARM;  Surgeon: Elam Dutch, MD;  Location: [place];  Service: Vascular;  Laterality: Right;  . AV FISTULA PLACEMENT Left 08/10/2014   Procedure: BASILIC VEIN TRANSPOSITION  ARTERIOVENOUS (AV) FISTULA CREATION LEFT UPPER ARM;  Surgeon: Mal Misty, MD;  Location: [place];  Service: Vascular;  Laterality: Left;  . COLONOSCOPY    . ESOPHAGOGASTRODUODENOSCOPY (EGD) WITH PROPOFOL N/A 04/12/2013   Procedure: ESOPHAGOGASTRODUODENOSCOPY (EGD) WITH PROPOFOL;  Surgeon: Arta Silence, MD;  Location: [facility] ENDOSCOPY;  Service: Endoscopy;  Laterality: N/A;  . INSERTION OF DIALYSIS CATHETER N/A 12/23/2012  Procedure: INSERTION OF DIALYSIS CATHETER; ULTRASOUND GUIDED;  Surgeon: Angelia Mould, MD;  Location: [facility] OR;  Service: Vascular;  Laterality: N/A;  . INSERTION OF DIALYSIS CATHETER  10/22/2015   Right IJ non-tunneled HD catheter  . IR GENERIC HISTORICAL  10/22/2015   IR US GUIDE VASC ACCESS RIGHT 10/22/2015 [facility]INTER[facility]  . IR GENERIC HISTORICAL  10/22/2015   IR FLUORO GUIDE CV LINE RIGHT 10/22/2015 [facility]  . IR GENERIC HISTORICAL  10/23/2015   IR FLUORO GUIDE CV LINE RIGHT 10/23/2015 Marybelle Killings, MD [facility]INTER[facility]  . LEFT HEART CATHETERIZATION WITH CORONARY  ANGIOGRAM N/A 07/13/2013   Procedure: LEFT HEART CATHETERIZATION WITH CORONARY ANGIOGRAM;  Surgeon: Jettie Booze, MD;  Location: [facility] CATH LAB;  Service: Cardiovascular;  Laterality: N/A;  . LIGATION OF ARTERIOVENOUS  FISTULA Left 12/22/2012   Procedure: LIGATION OF ARTERIOVENOUS  FISTULA;EXCISION OF LARGE ANEURYSMS;;  Surgeon: Elam Dutch, MD;  Location: [place];  Service: Vascular;  Laterality: Left;       Home Medications    Prior to Admission medications   Medication Sig Start Date End Date Taking? Authorizing Provider  ALPRAZolam Duanne Moron) 1 MG tablet Take 1 mg by mouth 2 (two) times daily.     [provider]  atorvastatin (LIPITOR) 20 MG tablet Take 1 tablet (20 mg total) by mouth daily. 06/21/13   Dorothy Spark, MD  cloNIDine (CATAPRES - DOSED IN MG/24 HR) 0.3 mg/24hr Place 1 patch onto the skin once a week. Saturday.    [provider]  esomeprazole (NEXIUM) 40 MG capsule Take 40-80 mg by mouth daily as needed (reflux/indigestion. Dose varies depending on severity.).     [provider]  SENSIPAR 60 MG tablet  11/06/15   [provider]  sevelamer carbonate (RENVELA) 800 MG tablet Take 800 mg by mouth 3 (three) times daily with meals. Takes 5-6 pills with a big meal, 3-4 pills with small meals    [provider]  tamsulosin (FLOMAX) 0.4 MG CAPS capsule Take 0.4 mg by mouth daily. 08/01/14   [provider]    Family History Family History  Problem Relation Age of Onset  . Hypertension Mother   . Cerebrovascular Accident Father   . Hypertension Father   . Congestive Heart Failure Brother   . Asthma Brother     Social History Social History   Tobacco Use  . Smoking status: Current Every Day Smoker    Packs/day: 0.50    Years: 16.00    Pack years: 8.00    Types: Cigarettes  . Smokeless tobacco: Never Used  Substance Use Topics  . Alcohol use: Yes    Alcohol/week: 0.0 oz    Comment: occassional  . Drug  use: No     Allergies   Aspirin   Review of Systems Review of Systems  Constitutional: Positive for fever. Negative for chills.  HENT: Negative for congestion, ear pain and sore throat.   Eyes: Negative for pain and visual disturbance.  Respiratory: Negative for cough and shortness of breath.   Cardiovascular: Negative for chest pain and palpitations.  Gastrointestinal: Positive for abdominal pain, blood in stool, nausea and vomiting.  Genitourinary: Negative for dysuria and hematuria.  Musculoskeletal: Negative for arthralgias and back pain.  Skin: Negative for color change and rash.  Neurological: Negative for seizures, syncope and headaches.  All other systems reviewed and are negative.    Physical Exam Updated Vital Signs BP (!) 181/101 (BP Location: Right Arm)  Pulse 70   Temp (!) 102.5 F (39.2 C) (Oral)   Resp 18   Ht 5' 5"  (1.651 m)   Wt 83.9 kg (185 lb)   SpO2 94%   BMI 30.79 kg/m   Physical Exam  Constitutional: He is oriented to person, place, and time. He appears well-developed and well-nourished.  HENT:  Head: Normocephalic and atraumatic.  Eyes: Conjunctivae and EOM are normal. Pupils are equal, round, and reactive to light.  Neck: Normal range of motion. Neck supple.  Cardiovascular: Normal rate and regular rhythm.  Pulmonary/Chest: Effort normal and breath sounds normal. No respiratory distress.  Abdominal: Soft. He exhibits distension. There is tenderness (diffuse tenderness, mild).  Musculoskeletal: He exhibits no edema.  Neurological: He is alert and oriented to person, place, and time.  Skin: Skin is warm and dry.  Psychiatric: He has a normal mood and affect.  Nursing note and vitals reviewed.    ED Treatments / Results  Labs (all labs ordered are listed, but only abnormal results are displayed) Labs Reviewed  URINALYSIS, ROUTINE W REFLEX MICROSCOPIC - Abnormal; Notable for the following components:      Result Value   Color, Urine  STRAW (*)    pH 9.0 (*)    Hgb urine dipstick SMALL (*)    Bacteria, UA RARE (*)    Squamous Epithelial / LPF 0-5 (*)    All other components within normal limits  COMPREHENSIVE METABOLIC PANEL  CBC WITH DIFFERENTIAL/PLATELET  I-STAT CG4 LACTIC ACID, ED  I-STAT CG4 LACTIC ACID, ED    EKG  EKG Interpretation  Date/Time:  Sunday March 08 2017 16:53:37 EST Ventricular Rate:  75 PR Interval:    QRS Duration: 89 QT Interval:  407 QTC Calculation: 455 R Axis:   -53 Text Interpretation:  Sinus rhythm Left anterior fascicular block Nonspecific T abnormalities, lateral leads Confirmed by Elnora Morrison [phone]) on 03/08/2017 4:58:54 PM       Radiology Dg Chest 2 View  Result Date: 03/08/2017 CLINICAL DATA:  Patient had new dialysis catheter placed this past Thursday and yesterday developed chills and abdominal soreness. Has had vomiting today. hx smoking but recently quit. Fever today. EXAM: CHEST  2 VIEW COMPARISON:  07/09/2013 FINDINGS: Right IJ dialysis catheter tip overlies the level of the lower superior vena cava. Heart is enlarged. There are no focal consolidations or pleural effusions. No pulmonary edema. No pneumothorax. IMPRESSION: Stable cardiomegaly.  No evidence for acute pulmonary abnormality. Electronically Signed   By: Nolon Nations M.D.   On: 03/08/2017 14:48    Procedures Procedures (including critical care time)  Medications Ordered in ED Medications  acetaminophen (TYLENOL) tablet 650 mg (650 mg Oral Given 03/08/17 1401)     Initial Impression / Assessment and Plan / ED Course  I have reviewed the triage vital signs and the nursing notes.  Pertinent labs & imaging results that were available during my care of the patient were reviewed by me and considered in my medical decision making (see chart for details).     Mr. Prehn is a 52 year old male with past medical history significant for end-stage renal disease on HD, hypertension, hypoparathyroidism,  cirrhosis, COPD, anemia, anxiety who presents for abdominal pain and fever.  Of note the patient recently had a hemodialysis catheter placed and AV fistula removed.  Sites are clean and dry.  Patient is febrile to 102.5. Elevated BP.  Chest x-ray obtained, personally reviewed by me, demonstrates no acute cardiac or pulmonary processes.  Stable cardiomegaly.  EKG obtained demonstrates sinus rhythm with interventricular conduction delay and nonspecific T wave changes.  Labs obtained significant for urine without evidence of UTI, anemia baseline, normal initial lactic acid.  CT abdomen/pelvis obtained as patient continues to have abdominal pain.  Results are significant for findings concerning for pneumonia.  Chronic kidney changes noted as well.  Antibiotics given.  CMP was delayed.  Results are significant for elevated potassium and evidence of but chronic buildup secondary to kidney function.  Patient missed his Friday dialysis session.  Potassium was temporized with albuterol and insulin/glucose.  T waves are not substantially peaked, will defer calcium at this time.  Nephrology consulted and requests Kayexalate.  They anticipate dialysis in the morning.  Patient is admitted to the hospital service.  Final Clinical Impressions(s) / ED Diagnoses   Final diagnoses:  Healthcare-associated pneumonia  Hyperkalemia    ED Discharge Orders    None       Elveria Rising, MD 03/09/17 0100    Elnora Morrison, MD 03/09/17 [phone]

## 2017-03-08 NOTE — ED Notes (Signed)
Pt c/o left abd pain after trying to drink the ct contrast.

## 2017-03-08 NOTE — ED Notes (Signed)
Patient drinking oral contrast for CT. One bottle til 1745. Second bottle til 1645. Then patient will be ready for CT.

## 2017-03-08 NOTE — ED Notes (Signed)
Patient transported to X-ray 

## 2017-03-08 NOTE — ED Notes (Signed)
Patient refuses stick for second set of blood cultures. Also, patient has drank 3/4 of first bottle of contrast, but did not finish. He states that he does not plan to drink anymore.

## 2017-03-08 NOTE — ED Notes (Signed)
Reather Converse, MD notified of nursing staff unable to get blood cultures due to having limb restriction and difficult stick. MD stated go ahead and start IV antibiotics

## 2017-03-08 NOTE — ED Triage Notes (Signed)
Patient had new dialysis catheter placed this past Thursday and yesterday developed chills and abdominal soreness. Has had vomiting today pta. Alert and oriented.

## 2017-03-08 NOTE — ED Notes (Signed)
Pt vomiting yellow, foamy emesis. Pt states that the pain is coming back.

## 2017-03-08 NOTE — H&P (Addendum)
History and Physical    James Wall RAQ:[id number] DOB: 1966-01-14 DOA: 03/08/2017  Referring MD/NP/PA: Dr. Elnora Morrison PCP: Kelton Pillar, Herschell Dimes, MD  Patient coming from: home  Chief Complaint: Fever and chills  I have personally briefly reviewed patient's old medical records in [place]   HPI: James Wall is a 52 y.o. male with medical history significant of ESRD on HD M/W/F, HTN, CAD, COPD, chronic intermittent nausea/vomiting; who presents with complaints of fever and chills over the last 1-2 days.  Reports having subjective fevers, nausea, vomiting, bloating, intermittent belching, and left-sided abdominal pain/discomfort.  Notes that the abdominal pain has been radiating up almost into his chest.  Denies trying anything for the symptoms.  He had just recently had a Vas-Cath placed on 3 days ago after losing previous access.  His last dialysis session was on 12/30, as he did not want to have to endure discomfort with dialysis from the recently placed Vas-Cath..  There is been some mild tenderness around the area, but no significant redness or swelling that he is noticed.  Other associated symptoms include nerve tooth pain, spitting white mucus, and 3 intermittent reports of blood with stools.  Patient reports being a homosexual male who " bottoms "/receives reciprocal intercourse, but states that he is not been sexually active for the last 2 years.  Denies any complaints of chest pain, shortness of breath, or significant cough  ED Course: Upon admission into the emergency department patient was noted to be febrile up to 102.5 F, pulse 65-97, respirations 14-28, blood pressure 168/94-186/101, and O2 saturations 91-100% on room air.  Initial labs obtained the CBC and lactic acid.  WBC 9.2, hemoglobin 10.4, platelets 219, and lactic acid 1.06.  Urinalysis did not appear to show significant signs of infection.  CT scan of the abdomen and pelvis without contrast showed  patchy opacities concerning for pneumonia, COPD and bronchitis changes, cardiomegaly and atrophy kidneys with multiple small hemorrhagic and nonhemorrhagic cysts, diffuse bone sclerosis.  After BMP resulted showing elevated potassium of 6.7.  Nephrology was consulted and placed orders for hemodialysis in a.m.  Patient was given calcium gluconate and Kayexalate for treatment of hyperkalemia.  rReview of Systems  Constitutional: Positive for chills and fever.  HENT: Negative for congestion and nosebleeds.        Positive for dental pain  Eyes: Negative for photophobia and pain.  Respiratory: Positive for sputum production. Negative for cough and shortness of breath.   Cardiovascular: Negative for chest pain and leg swelling.  Gastrointestinal: Positive for blood in stool, nausea and vomiting.  Genitourinary: Negative for dysuria and frequency.  Musculoskeletal: Negative for back pain and falls.  Skin: Negative for itching and rash.  Neurological: Negative for speech change and focal weakness.  Endo/Heme/Allergies: Negative for polydipsia.  Psychiatric/Behavioral: Negative for hallucinations and substance abuse.    Past Medical History:  Diagnosis Date  . Anemia   . Anxiety   . Arthritis    GOUT - pt not sure if this is true  . 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 Cirrhosis, nonalcoholic ([place])   . COPD (chronic obstructive pulmonary disease) ([place])   . Depression   . ESRD on hemodialysis [facility])    Started HD Jan 2009.  ESRD was due to HTN.  Dx'd with HTN in hospital 1996 according to pt, they had to keep him so he could get Medicaid to afford the BP medications.  First saw a nephrologist and started HD in  the same year 2009.  Gets HD at [facility] on a MWF schedule.  Does not have DM. He had a left RC AVF that never functioned, a left upper arm AVF that worked for about 5 years and as of June  . GERD (gastroesophageal reflux disease)   . Hypertension   . Renal insufficiency   . Secondary  hyperparathyroidism ([place])   . Sepsis ([place]) 02/2013   from AVF , treated with Vancomycin.  Marland Kitchen Shortness of breath    With exertion  . Sleep apnea    no  longer using cpap    Past Surgical History:  Procedure Laterality Date  . AV FISTULA PLACEMENT  2009   Left lower arm AVF  . AV FISTULA PLACEMENT Right 02/22/2013   Procedure:  CREATION  OF BRACHIAL CEPHALIC FISTULA RIGHT ARM;  Surgeon: Elam Dutch, MD;  Location: [place];  Service: Vascular;  Laterality: Right;  . AV FISTULA PLACEMENT Left 08/10/2014   Procedure: BASILIC VEIN TRANSPOSITION  ARTERIOVENOUS (AV) FISTULA CREATION LEFT UPPER ARM;  Surgeon: Mal Misty, MD;  Location: [place];  Service: Vascular;  Laterality: Left;  . COLONOSCOPY    . ESOPHAGOGASTRODUODENOSCOPY (EGD) WITH PROPOFOL N/A 04/12/2013   Procedure: ESOPHAGOGASTRODUODENOSCOPY (EGD) WITH PROPOFOL;  Surgeon: Arta Silence, MD;  Location: [facility] ENDOSCOPY;  Service: Endoscopy;  Laterality: N/A;  . INSERTION OF DIALYSIS CATHETER N/A 12/23/2012   Procedure: INSERTION OF DIALYSIS CATHETER; ULTRASOUND GUIDED;  Surgeon: Angelia Mould, MD;  Location: [facility] OR;  Service: Vascular;  Laterality: N/A;  . INSERTION OF DIALYSIS CATHETER  10/22/2015   Right IJ non-tunneled HD catheter  . IR GENERIC HISTORICAL  10/22/2015   IR US GUIDE VASC ACCESS RIGHT 10/22/2015 [facility]INTER[facility] RAD  . IR GENERIC HISTORICAL  10/22/2015   IR FLUORO GUIDE CV LINE RIGHT 10/22/2015 [facility]  . IR GENERIC HISTORICAL  10/23/2015   IR FLUORO GUIDE CV LINE RIGHT 10/23/2015 Marybelle Killings, MD [facility]INTER[facility]  . LEFT HEART CATHETERIZATION WITH CORONARY ANGIOGRAM N/A 07/13/2013   Procedure: LEFT HEART CATHETERIZATION WITH CORONARY ANGIOGRAM;  Surgeon: Jettie Booze, MD;  Location: [facility] C[facility] LAB;  Service: Cardiovascular;  Laterality: N/A;  . LIGATION OF ARTERIOVENOUS  FISTULA Left 12/22/2012   Procedure: LIGATION OF ARTERIOVENOUS  FISTULA;EXCISION OF LARGE ANEURYSMS;;  Surgeon: Elam Dutch, MD;  Location: [place];  Service: Vascular;  Laterality: Left;     reports that he has been smoking cigarettes.  He has a 8.00 pack-year smoking history. he has never used smokeless tobacco. He reports that he drinks alcohol. He reports that he does not use drugs.  Allergies  Allergen Reactions  . Aspirin Other (See Comments)    Made congestion worse    Family History  Problem Relation Age of Onset  . Hypertension Mother   . Cerebrovascular Accident Father   . Hypertension Father   . Congestive Heart Failure Brother   . Asthma Brother     Prior to Admission medications   Medication Sig Start Date End Date Taking? Authorizing Provider  acetaminophen (TYLENOL) 650 MG CR tablet Take 650-2,600 mg by mouth daily as needed for pain.   Yes [provider]  cloNIDine (CATAPRES - DOSED IN MG/24 HR) 0.3 mg/24hr Place 0.3 mg onto the skin every Sunday. Saturday.   Yes [provider]  diphenhydramine-acetaminophen (TYLENOL PM) 25-500 MG TABS tablet Take 3 tablets by mouth at bedtime as needed (sleep).   Yes [provider]  hydrOXYzine (ATARAX/VISTARIL) 25 MG tablet  Take 25 mg by mouth every 8 (eight) hours as needed for itching.   Yes [provider]  omeprazole (PRILOSEC) 20 MG capsule Take 20 mg by mouth daily as needed (acid reflux).   Yes [provider]  SENSIPAR 60 MG tablet Take 60 mg by mouth every Monday, Wednesday, and Friday with hemodialysis.  11/06/15  Yes [provider]  sevelamer carbonate (RENVELA) 800 MG tablet Take 3,200-4,800 mg by mouth See admin instructions. Take 4-6 tablets (3200-4800 mg) by mouth one to two times daily with meals   Yes [provider]  tamsulosin (FLOMAX) 0.4 MG CAPS capsule Take 0.4 mg by mouth every other day.  08/01/14  Yes [provider]  atorvastatin (LIPITOR) 20 MG tablet Take 1 tablet (20 mg total) by mouth daily. Patient not taking: Reported on 03/08/2017 06/21/13   Dorothy Spark, MD     Physical Exam:  Constitutional: Older male who appears to be in discomfort Vitals:   03/08/17 1645 03/08/17 1715 03/08/17 1830 03/08/17 2009  BP: (!) 168/94 (!) 174/99 (!) 186/101   Pulse: 71 65 97   Resp:  (!) 28 (!) 28   Temp:    (!) 101 F (38.3 C)  TempSrc:    Oral  SpO2: 96% 92% 91%   Weight:      Height:       Eyes: PERRL, lids and conjunctivae normal ENMT: Mucous membranes are dry. Posterior pharynx clear of any exudate or lesions.  Neck: normal, supple, no masses, no thyromegaly Respiratory: Decreased overall aeration no significant wheezes or crackles appreciated. Cardiovascular: Regular rate and rhythm, no murmurs / rubs / gallops. No extremity edema. 2+ pedal pulses. No carotid bruits.  Vas-Cath present of right upper chest wall. Abdomen: Abdominal tenderness noted of the Musculoskeletal: no clubbing / cyanosis. No joint deformity upper and lower extremities. Good ROM, no contractures. Normal muscle tone.  Skin: no rashes, lesions, ulcers. No induration Neurologic: CN 2-12 grossly intact. Sensation intact, DTR normal. Strength 5/5 in all 4.  Psychiatric: Normal judgment and insight. Alert and oriented x 3. Normal mood.     Labs on Admission: I have personally reviewed following labs and imaging studies  CBC: Recent Labs  Lab 03/08/17 1803  WBC 9.2  NEUTROABS 7.3  HGB 10.4*  HCT 31.4*  MCV 100.6*  PLT 151   Basic Metabolic Panel: No results for input(s): NA, K, CL, CO2, GLUCOSE, BUN, CREATININE, CALCIUM, MG, PHOS in the last 168 hours. GFR: CrCl cannot be calculated (Patient's most recent lab result is older than the maximum 21 days allowed.). Liver Function Tests: No results for input(s): AST, ALT, ALKPHOS, BILITOT, PROT, ALBUMIN in the last 168 hours. No results for input(s): LIPASE, AMYLASE in the last 168 hours. No results for input(s): AMMONIA in the last 168 hours. Coagulation Profile: No results for input(s): INR, PROTIME in the last 168  hours. Cardiac Enzymes: No results for input(s): CKTOTAL, CKMB, CKMBINDEX, TROPONINI in the last 168 hours. BNP (last 3 results) No results for input(s): PROBNP in the last 8760 hours. HbA1C: No results for input(s): HGBA1C in the last 72 hours. CBG: No results for input(s): GLUCAP in the last 168 hours. Lipid Profile: No results for input(s): CHOL, HDL, LDLCALC, TRIG, CHOLHDL, LDLDIRECT in the last 72 hours. Thyroid Function Tests: No results for input(s): TSH, T4TOTAL, FREET4, T3FREE, THYROIDAB in the last 72 hours. Anemia Panel: No results for input(s): VITAMINB12, FOLATE, FERRITIN, TIBC, IRON, RETICCTPCT in the last 72 hours.  Urine analysis:    Component Value Date/Time   COLORURINE STRAW (A) 03/08/2017 1357   APPEARANCEUR CLEAR 03/08/2017 1357   LABSPEC 1.008 03/08/2017 1357   PHURINE 9.0 (H) 03/08/2017 1357   GLUCOSEU NEGATIVE 03/08/2017 1357   HGBUR SMALL (A) 03/08/2017 1357   BILIRUBINUR NEGATIVE 03/08/2017 1357   KETONESUR NEGATIVE 03/08/2017 1357   PROTEINUR NEGATIVE 03/08/2017 1357   NITRITE NEGATIVE 03/08/2017 1357   LEUKOCYTESUR NEGATIVE 03/08/2017 1357   Sepsis Labs: No results found for this or any previous visit (from the past 240 hour(s)).   Radiological Exams on Admission: Ct Abdomen Pelvis Wo Contrast  Result Date: 03/08/2017 CLINICAL DATA:  Chills, abdominal pain and vomiting. New dialysis catheter placed three days ago. EXAM: CT ABDOMEN AND PELVIS WITHOUT CONTRAST TECHNIQUE: Multidetector CT imaging of the abdomen and pelvis was performed following the standard protocol without IV contrast. COMPARISON:  04/19/2013. FINDINGS: Lower chest: Patchy opacity in both lower lobes, greater on the right. Mild interstitial prominence at both lung bases. Prominent pulmonary vasculature. Diffuse peribronchial thickening. Left lower lobe bullous change. Enlarged heart. Hepatobiliary: No focal liver abnormality is seen. No gallstones, gallbladder wall thickening, or biliary  dilatation. Pancreas: Unremarkable. No pancreatic ductal dilatation or surrounding inflammatory changes. Spleen: Normal in size without focal abnormality. Adrenals/Urinary Tract: Normal appearing adrenal glands. Small kidneys with multiple hemorrhagic and nonhemorrhagic cysts. Poorly distended urinary bladder. No ureteral abnormalities seen. Stomach/Bowel: Stomach is within normal limits. Appendix appears normal. No evidence of bowel wall thickening, distention, or inflammatory changes. Vascular/Lymphatic: Atheromatous arterial calcifications without aneurysm. No enlarged lymph nodes. Reproductive: Prostate is unremarkable. Other: Small bilateral inguinal hernias containing fat. Small umbilical and left paraumbilical hernia containing fat. Musculoskeletal: Diffuse bone sclerosis. Lower thoracic spine degenerative changes. IMPRESSION: 1. Patchy opacity in both lower lobes, greater on the right, suspicious for pneumonia. 2. Changes of COPD and bronchitis at the lung bases with pulmonary vascular congestion. 3. Cardiomegaly. 4. Atrophied kidneys containing multiple small hemorrhagic and nonhemorrhagic cysts. 5. Diffuse bone sclerosis, compatible with renal osteodystrophy. Electronically Signed   By: Claudie Revering M.D.   On: 03/08/2017 19:04   Dg Chest 2 View  Result Date: 03/08/2017 CLINICAL DATA:  Patient had new dialysis catheter placed this past Thursday and yesterday developed chills and abdominal soreness. Has had vomiting today. hx smoking but recently quit. Fever today. EXAM: CHEST  2 VIEW COMPARISON:  07/09/2013 FINDINGS: Right IJ dialysis catheter tip overlies the level of the lower superior vena cava. Heart is enlarged. There are no focal consolidations or pleural effusions. No pulmonary edema. No pneumothorax. IMPRESSION: Stable cardiomegaly.  No evidence for acute pulmonary abnormality. Electronically Signed   By: Nolon Nations M.D.   On: 03/08/2017 14:48    EKG: Independently reviewed.  Sinus  rhythm with signs of T wave peaking  Assessment/Plan SIRS/Sepsis 2/2 pneumonia: Patient presents with complaints of fever and chills.  Found to be febrile to 102.5 F, WBC 9.2, lactic acid trending up to 2.  Urinalysis was negative for any signs of infection and chest x-ray was otherwise noted to stable cardiomegaly without signs of edema.  CT scan showed possible signs of pneumonia.  Patient denies having any significant cough, but reports spitting white mucus.  Patient was started on empiric antibiotics of vancomycin and cefepime for possible healthcare associated pneumonia. - Admit to a telemetry bed - Follow-up influenza panel and pro-calcitonin - Follow-up blood and sputum cultures - Continue empiric antibiotics of vancomycin and cefepime - Trend lactic acid level  Hyperkalemia:  Acute.  Initial potassium noted to be 6.7 with signs of T wave peaking on EKG.  Patient was given calcium gluconate and Kayexalate. - Per nephrology - Recheck renal function panel in a.m.  Hypertensive urgency acute.:  Patient notes that he is in need of changing out his clonidine patch Sunday, but has not done so yet. -Continue clonidine patch - Hydralazine IV prn  Left-sided abdominal pain: CT scan shows no clear cause for patient's symptoms. - Continue to monitor  Nausea and vomiting: Acute on chronic. - Emetics as needed  ESRD on HD: Patient reports normally dialyzes in M/W/F, but last dialysis session on 1/30.  - Continue Sensipar, [place] nephrology consultative services, for patient received dialysis in a.m.  Anemia chronic disease, rectal bleeding: Acute.  Patient reports 3 episodes of intermittent rectal bleeding.  Question possibility of hemorrhoids/fissure vs. diverticular.  Previous year history of abdominal polyps and reports need of colonoscopy seen previously by Dr. Paulita Fujita. - Check stool guaiac  - Monitor H&H - Check HIV given history  GERD - Continue Protonix     DVT  prophylaxis: SCDs, reporting rectal bleeding Code Status: Full  Family Communication: No family present Disposition Plan: TBD Consults called: nephrology Admission status: inpatient  Norval Morton MD [facility] Pager [phone]   If 7PM-7AM, please contact night-coverage [email or web address] Password [facility]  03/08/2017, 8:17 PM

## 2017-03-08 NOTE — Progress Notes (Signed)
Pharmacy Antibiotic Note  James Wall is a 52 y.o. male admitted on 03/08/2017 with pneumonia.  Pharmacy has been consulted for Vancomycin/Cefepime dosing.WBC WNL and TMax- 102.5. Patient is ESRD with HD TTS.   Plan: Cefepime 1 gm every 24 hours  Vancomycin 1750 mg X 1  F/U HD schedule for further doses   Height: 5' 5"  (165.1 cm) Weight: 185 lb (83.9 kg) IBW/kg (Calculated) : 61.5  Temp (24hrs), Avg:101.8 F (38.8 C), Min:101 F (38.3 C), Max:102.5 F (39.2 C)  Recent Labs  Lab 03/08/17 1803 03/08/17 1817  WBC 9.2  --   LATICACIDVEN  --  1.06    CrCl cannot be calculated (Patient's most recent lab result is older than the maximum 21 days allowed.).    Allergies  Allergen Reactions  . Aspirin Other (See Comments)    Made congestion worse    Antimicrobials this admission: 2/3 Vanc>> 2/3 Cefepime >>   Thank you for allowing pharmacy to be a part of this patient's care.  [name], PharmD, BCPS PGY2 Infectious Diseases Pharmacy Resident Pager: [phone]  03/08/2017 8:25 PM

## 2017-03-08 NOTE — ED Notes (Signed)
This EMT attempted urine collection from pt. Pt voiced understanding.

## 2017-03-08 NOTE — ED Notes (Signed)
Pt unable to take tylenol due to nausea. Refusing medication right now.

## 2017-03-09 ENCOUNTER — Other Ambulatory Visit: Payer: Self-pay

## 2017-03-09 DIAGNOSIS — K0889 Other specified disorders of teeth and supporting structures: Secondary | ICD-10-CM | POA: Diagnosis not present

## 2017-03-09 DIAGNOSIS — A419 Sepsis, unspecified organism: Secondary | ICD-10-CM | POA: Diagnosis present

## 2017-03-09 DIAGNOSIS — J440 Chronic obstructive pulmonary disease with acute lower respiratory infection: Secondary | ICD-10-CM | POA: Diagnosis present

## 2017-03-09 DIAGNOSIS — J690 Pneumonitis due to inhalation of food and vomit: Secondary | ICD-10-CM | POA: Diagnosis not present

## 2017-03-09 DIAGNOSIS — D62 Acute posthemorrhagic anemia: Secondary | ICD-10-CM | POA: Diagnosis not present

## 2017-03-09 DIAGNOSIS — R109 Unspecified abdominal pain: Secondary | ICD-10-CM | POA: Diagnosis not present

## 2017-03-09 DIAGNOSIS — Z8619 Personal history of other infectious and parasitic diseases: Secondary | ICD-10-CM | POA: Diagnosis not present

## 2017-03-09 DIAGNOSIS — I12 Hypertensive chronic kidney disease with 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: Secondary | ICD-10-CM | POA: Diagnosis not present

## 2017-03-09 DIAGNOSIS — I160 Hypertensive urgency: Secondary | ICD-10-CM | POA: Diagnosis not present

## 2017-03-09 DIAGNOSIS — K921 Melena: Secondary | ICD-10-CM | POA: Diagnosis present

## 2017-03-09 DIAGNOSIS — E669 Obesity, unspecified: Secondary | ICD-10-CM | POA: Diagnosis not present

## 2017-03-09 DIAGNOSIS — G4730 Sleep apnea, unspecified: Secondary | ICD-10-CM | POA: Diagnosis present

## 2017-03-09 DIAGNOSIS — D631 Anemia in chronic kidney disease: Secondary | ICD-10-CM | POA: Diagnosis not present

## 2017-03-09 DIAGNOSIS — K920 Hematemesis: Secondary | ICD-10-CM | POA: Diagnosis not present

## 2017-03-09 DIAGNOSIS — J189 Pneumonia, unspecified organism: Secondary | ICD-10-CM | POA: Diagnosis not present

## 2017-03-09 DIAGNOSIS — Y95 Nosocomial condition: Secondary | ICD-10-CM | POA: Diagnosis present

## 2017-03-09 DIAGNOSIS — K7581 Nonalcoholic steatohepatitis (NASH): Secondary | ICD-10-CM | POA: Diagnosis not present

## 2017-03-09 DIAGNOSIS — E875 Hyperkalemia: Secondary | ICD-10-CM | POA: Diagnosis present

## 2017-03-09 DIAGNOSIS — F17211 Nicotine dependence, cigarettes, in remission: Secondary | ICD-10-CM | POA: Diagnosis not present

## 2017-03-09 DIAGNOSIS — R0602 Shortness of breath: Secondary | ICD-10-CM | POA: Diagnosis not present

## 2017-03-09 DIAGNOSIS — N281 Cyst of kidney, acquired: Secondary | ICD-10-CM | POA: Diagnosis present

## 2017-03-09 DIAGNOSIS — K644 Residual hemorrhoidal skin tags: Secondary | ICD-10-CM | POA: Diagnosis present

## 2017-03-09 DIAGNOSIS — R1110 Vomiting, unspecified: Secondary | ICD-10-CM | POA: Diagnosis not present

## 2017-03-09 DIAGNOSIS — R093 Abnormal sputum: Secondary | ICD-10-CM | POA: Diagnosis not present

## 2017-03-09 DIAGNOSIS — K7460 Unspecified cirrhosis of liver: Secondary | ICD-10-CM | POA: Diagnosis not present

## 2017-03-09 DIAGNOSIS — R6889 Other general symptoms and signs: Secondary | ICD-10-CM | POA: Diagnosis present

## 2017-03-09 DIAGNOSIS — Z992 Dependence on renal dialysis: Secondary | ICD-10-CM | POA: Diagnosis not present

## 2017-03-09 DIAGNOSIS — D539 Nutritional anemia, unspecified: Secondary | ICD-10-CM | POA: Diagnosis present

## 2017-03-09 DIAGNOSIS — R112 Nausea with vomiting, unspecified: Secondary | ICD-10-CM | POA: Diagnosis not present

## 2017-03-09 DIAGNOSIS — J181 Lobar pneumonia, unspecified organism: Secondary | ICD-10-CM | POA: Diagnosis not present

## 2017-03-09 DIAGNOSIS — M1990 Unspecified osteoarthritis, unspecified site: Secondary | ICD-10-CM | POA: Diagnosis present

## 2017-03-09 DIAGNOSIS — J449 Chronic obstructive pulmonary disease, unspecified: Secondary | ICD-10-CM | POA: Diagnosis not present

## 2017-03-09 DIAGNOSIS — K7469 Other cirrhosis of liver: Secondary | ICD-10-CM | POA: Diagnosis present

## 2017-03-09 DIAGNOSIS — R1032 Left lower quadrant pain: Secondary | ICD-10-CM | POA: Diagnosis not present

## 2017-03-09 DIAGNOSIS — N2581 Secondary hyperparathyroidism of renal origin: Secondary | ICD-10-CM | POA: Diagnosis present

## 2017-03-09 DIAGNOSIS — K047 Periapical abscess without sinus: Secondary | ICD-10-CM | POA: Diagnosis not present

## 2017-03-09 DIAGNOSIS — R05 Cough: Secondary | ICD-10-CM | POA: Diagnosis not present

## 2017-03-09 DIAGNOSIS — R0600 Dyspnea, unspecified: Secondary | ICD-10-CM | POA: Diagnosis not present

## 2017-03-09 DIAGNOSIS — R509 Fever, unspecified: Secondary | ICD-10-CM | POA: Diagnosis not present

## 2017-03-09 DIAGNOSIS — I2510 Atherosclerotic heart disease of native coronary artery without angina pectoris: Secondary | ICD-10-CM | POA: Diagnosis not present

## 2017-03-09 DIAGNOSIS — N186 End stage renal disease: Secondary | ICD-10-CM | POA: Diagnosis not present

## 2017-03-09 DIAGNOSIS — K219 Gastro-esophageal reflux disease without esophagitis: Secondary | ICD-10-CM | POA: Diagnosis not present

## 2017-03-09 DIAGNOSIS — J811 Chronic pulmonary edema: Secondary | ICD-10-CM | POA: Diagnosis not present

## 2017-03-09 DIAGNOSIS — Z886 Allergy status to analgesic agent status: Secondary | ICD-10-CM | POA: Diagnosis not present

## 2017-03-09 DIAGNOSIS — I1311 Hypertensive heart and chronic kidney disease without heart failure, with stage 5 chronic kidney disease, or end stage renal disease: Secondary | ICD-10-CM | POA: Diagnosis present

## 2017-03-09 LAB — CBC WITH DIFFERENTIAL/PLATELET
Basophils Absolute: 0 10*3/uL (ref 0.0–0.1)
Basophils Relative: 0 %
EOS PCT: 1 %
Eosinophils Absolute: 0.1 10*3/uL (ref 0.0–0.7)
HCT: 29.8 % — ABNORMAL LOW (ref 39.0–52.0)
HEMOGLOBIN: 9.8 g/dL — AB (ref 13.0–17.0)
LYMPHS ABS: 1.2 10*3/uL (ref 0.7–4.0)
Lymphocytes Relative: 11 %
MCH: 33.3 pg (ref 26.0–34.0)
MCHC: 32.9 g/dL (ref 30.0–36.0)
MCV: 101.4 fL — ABNORMAL HIGH (ref 78.0–100.0)
MONO ABS: 0.4 10*3/uL (ref 0.1–1.0)
MONOS PCT: 4 %
NEUTROS PCT: 84 %
Neutro Abs: 9.4 10*3/uL — ABNORMAL HIGH (ref 1.7–7.7)
Platelets: 210 10*3/uL (ref 150–400)
RBC: 2.94 MIL/uL — ABNORMAL LOW (ref 4.22–5.81)
RDW: 15.5 % (ref 11.5–15.5)
WBC: 11.2 10*3/uL — ABNORMAL HIGH (ref 4.0–10.5)

## 2017-03-09 LAB — RENAL FUNCTION PANEL
Albumin: 3 g/dL — ABNORMAL LOW (ref 3.5–5.0)
Anion gap: 24 — ABNORMAL HIGH (ref 5–15)
BUN: 74 mg/dL — ABNORMAL HIGH (ref 6–20)
CHLORIDE: 92 mmol/L — AB (ref 101–111)
CO2: 18 mmol/L — AB (ref 22–32)
CREATININE: 20.58 mg/dL — AB (ref 0.61–1.24)
Calcium: 8.5 mg/dL — ABNORMAL LOW (ref 8.9–10.3)
GFR calc non Af Amer: 2 mL/min — ABNORMAL LOW (ref >60)
GFR, EST AFRICAN AMERICAN: 3 mL/min — AB (ref >60)
Glucose, Bld: 78 mg/dL (ref 65–99)
Phosphorus: 4.7 mg/dL — ABNORMAL HIGH (ref 2.5–4.6)
Potassium: 5.8 mmol/L — ABNORMAL HIGH (ref 3.5–5.1)
Sodium: 134 mmol/L — ABNORMAL LOW (ref 135–145)

## 2017-03-09 LAB — MRSA PCR SCREENING: MRSA by PCR: NEGATIVE

## 2017-03-09 LAB — INFLUENZA PANEL BY PCR (TYPE A & B)
INFLAPCR: NEGATIVE
Influenza B By PCR: NEGATIVE

## 2017-03-09 LAB — BASIC METABOLIC PANEL
ANION GAP: 20 — AB (ref 5–15)
BUN: 54 mg/dL — ABNORMAL HIGH (ref 6–20)
CHLORIDE: 94 mmol/L — AB (ref 101–111)
CO2: 19 mmol/L — AB (ref 22–32)
CREATININE: 15.97 mg/dL — AB (ref 0.61–1.24)
Calcium: 8.2 mg/dL — ABNORMAL LOW (ref 8.9–10.3)
GFR calc non Af Amer: 3 mL/min — ABNORMAL LOW (ref >60)
GFR, EST AFRICAN AMERICAN: 3 mL/min — AB (ref >60)
Glucose, Bld: 80 mg/dL (ref 65–99)
Potassium: 4.9 mmol/L (ref 3.5–5.1)
Sodium: 133 mmol/L — ABNORMAL LOW (ref 135–145)

## 2017-03-09 LAB — PROCALCITONIN: Procalcitonin: 17.38 ng/mL

## 2017-03-09 LAB — HIV ANTIBODY (ROUTINE TESTING W REFLEX): HIV SCREEN 4TH GENERATION: NONREACTIVE

## 2017-03-09 MED ORDER — SODIUM CHLORIDE 0.9 % IV SOLN
1.0000 g | Freq: Once | INTRAVENOUS | Status: AC
  Administered 2017-03-09: 1 g via INTRAVENOUS
  Filled 2017-03-09: qty 10

## 2017-03-09 MED ORDER — VANCOMYCIN HCL IN DEXTROSE 1-5 GM/200ML-% IV SOLN
1000.0000 mg | INTRAVENOUS | Status: DC
  Administered 2017-03-09 – 2017-03-11 (×2): 1000 mg via INTRAVENOUS
  Filled 2017-03-09 (×2): qty 200

## 2017-03-09 MED ORDER — ONDANSETRON HCL 4 MG/2ML IJ SOLN
4.0000 mg | Freq: Four times a day (QID) | INTRAMUSCULAR | Status: DC | PRN
  Administered 2017-03-12: 4 mg via INTRAVENOUS
  Filled 2017-03-09 (×2): qty 2

## 2017-03-09 MED ORDER — ACETAMINOPHEN 325 MG PO TABS
650.0000 mg | ORAL_TABLET | ORAL | Status: AC | PRN
  Administered 2017-03-09: 650 mg via ORAL

## 2017-03-09 MED ORDER — HYDROMORPHONE HCL 1 MG/ML IJ SOLN
0.5000 mg | INTRAMUSCULAR | Status: DC | PRN
  Administered 2017-03-09 – 2017-03-16 (×19): 0.5 mg via INTRAVENOUS
  Filled 2017-03-09 (×4): qty 0.5
  Filled 2017-03-09 (×2): qty 1
  Filled 2017-03-09 (×13): qty 0.5

## 2017-03-09 MED ORDER — SEVELAMER CARBONATE 800 MG PO TABS: 3200.0000 mg | ORAL_TABLET | Freq: Every day | ORAL | Status: DC | PRN

## 2017-03-09 MED ORDER — SODIUM CHLORIDE 0.9 % IV SOLN: 100.0000 mL | INTRAVENOUS | Status: DC | PRN

## 2017-03-09 MED ORDER — IPRATROPIUM-ALBUTEROL 0.5-2.5 (3) MG/3ML IN SOLN
3.0000 mL | Freq: Four times a day (QID) | RESPIRATORY_TRACT | Status: DC | PRN
  Administered 2017-03-13 – 2017-03-14 (×2): 3 mL via RESPIRATORY_TRACT
  Filled 2017-03-09 (×2): qty 3

## 2017-03-09 MED ORDER — CLONIDINE HCL 0.3 MG/24HR TD PTWK
0.3000 mg | MEDICATED_PATCH | TRANSDERMAL | Status: DC
  Administered 2017-03-09: 0.3 mg via TRANSDERMAL
  Filled 2017-03-09 (×3): qty 1

## 2017-03-09 MED ORDER — SEVELAMER CARBONATE 800 MG PO TABS: 3200.0000 mg | ORAL_TABLET | Freq: Two times a day (BID) | ORAL | Status: DC

## 2017-03-09 MED ORDER — DIPHENHYDRAMINE HCL 25 MG PO CAPS: 50.0000 mg | ORAL_CAPSULE | Freq: Every evening | ORAL | Status: DC | PRN

## 2017-03-09 MED ORDER — GI COCKTAIL ~~LOC~~
30.0000 mL | Freq: Two times a day (BID) | ORAL | Status: DC | PRN
  Administered 2017-03-14: 30 mL via ORAL
  Filled 2017-03-09: qty 30

## 2017-03-09 MED ORDER — ACETAMINOPHEN 325 MG PO TABS
ORAL_TABLET | ORAL | Status: AC
  Administered 2017-03-09: 650 mg via ORAL
  Filled 2017-03-09: qty 2

## 2017-03-09 MED ORDER — HEPARIN SODIUM (PORCINE) 1000 UNIT/ML DIALYSIS: 1000.0000 [IU] | INTRAMUSCULAR | Status: DC | PRN

## 2017-03-09 MED ORDER — OXYCODONE HCL 5 MG PO TABS
5.0000 mg | ORAL_TABLET | Freq: Four times a day (QID) | ORAL | Status: DC | PRN
  Filled 2017-03-09: qty 1

## 2017-03-09 MED ORDER — HYDRALAZINE HCL 20 MG/ML IJ SOLN
INTRAMUSCULAR | Status: AC
  Administered 2017-03-09: 10 mg via INTRAVENOUS
  Filled 2017-03-09: qty 1

## 2017-03-09 MED ORDER — PANTOPRAZOLE SODIUM 40 MG PO TBEC
80.0000 mg | DELAYED_RELEASE_TABLET | Freq: Every day | ORAL | Status: DC
  Administered 2017-03-09 – 2017-03-17 (×8): 80 mg via ORAL
  Filled 2017-03-09 (×9): qty 2

## 2017-03-09 MED ORDER — ONDANSETRON HCL 4 MG PO TABS
4.0000 mg | ORAL_TABLET | Freq: Four times a day (QID) | ORAL | Status: DC | PRN
Start: 2017-03-09 — End: 2017-03-17

## 2017-03-09 MED ORDER — DIPHENHYDRAMINE-APAP (SLEEP) 25-500 MG PO TABS: 3.0000 | ORAL_TABLET | Freq: Every evening | ORAL | Status: DC | PRN

## 2017-03-09 MED ORDER — HYDRALAZINE HCL 20 MG/ML IJ SOLN
10.0000 mg | INTRAMUSCULAR | Status: DC | PRN
  Administered 2017-03-09 – 2017-03-11 (×3): 10 mg via INTRAVENOUS
  Filled 2017-03-09 (×2): qty 1

## 2017-03-09 MED ORDER — ACETAMINOPHEN 325 MG PO TABS
650.0000 mg | ORAL_TABLET | ORAL | Status: DC | PRN
  Administered 2017-03-11 – 2017-03-14 (×10): 650 mg via ORAL
  Filled 2017-03-09 (×10): qty 2

## 2017-03-09 MED ORDER — HYDROXYZINE HCL 25 MG PO TABS: 25.0000 mg | ORAL_TABLET | Freq: Three times a day (TID) | ORAL | Status: DC | PRN

## 2017-03-09 MED ORDER — SIMETHICONE 80 MG PO CHEW
80.0000 mg | CHEWABLE_TABLET | ORAL | Status: AC
  Administered 2017-03-09: 80 mg via ORAL
  Filled 2017-03-09: qty 1

## 2017-03-09 MED ORDER — HYDROMORPHONE HCL 1 MG/ML IJ SOLN
0.5000 mg | Freq: Once | INTRAMUSCULAR | Status: AC
  Administered 2017-03-09: 0.5 mg via INTRAVENOUS

## 2017-03-09 MED ORDER — SEVELAMER CARBONATE 800 MG PO TABS: 1600.0000 mg | ORAL_TABLET | Freq: Two times a day (BID) | ORAL | Status: DC | PRN

## 2017-03-09 MED ORDER — SEVELAMER CARBONATE 800 MG PO TABS
3200.0000 mg | ORAL_TABLET | Freq: Every day | ORAL | Status: DC
  Administered 2017-03-09: 1600 mg via ORAL
  Administered 2017-03-10: 3200 mg via ORAL
  Filled 2017-03-09 (×3): qty 4

## 2017-03-09 MED ORDER — GUAIFENESIN ER 600 MG PO TB12
600.0000 mg | ORAL_TABLET | Freq: Two times a day (BID) | ORAL | Status: DC
  Administered 2017-03-09 – 2017-03-10 (×3): 600 mg via ORAL
  Filled 2017-03-09 (×3): qty 1

## 2017-03-09 MED ORDER — ALTEPLASE 2 MG IJ SOLR: 2.0000 mg | Freq: Once | INTRAMUSCULAR | Status: DC | PRN

## 2017-03-09 MED ORDER — HEPARIN SODIUM (PORCINE) 1000 UNIT/ML DIALYSIS: 4000.0000 [IU] | Freq: Once | INTRAMUSCULAR | Status: DC

## 2017-03-09 MED ORDER — TAMSULOSIN HCL 0.4 MG PO CAPS
0.4000 mg | ORAL_CAPSULE | ORAL | Status: DC
  Administered 2017-03-09 – 2017-03-17 (×4): 0.4 mg via ORAL
  Filled 2017-03-09 (×5): qty 1

## 2017-03-09 MED ORDER — ACETAMINOPHEN 500 MG PO TABS: 1000.0000 mg | ORAL_TABLET | Freq: Every evening | ORAL | Status: DC | PRN

## 2017-03-09 MED ORDER — CINACALCET HCL 30 MG PO TABS
60.0000 mg | ORAL_TABLET | ORAL | Status: DC
  Administered 2017-03-09: 60 mg via ORAL
  Filled 2017-03-09 (×3): qty 2

## 2017-03-09 MED ORDER — ONDANSETRON HCL 4 MG/2ML IJ SOLN: 4.0000 mg | Freq: Four times a day (QID) | INTRAMUSCULAR | Status: DC | PRN

## 2017-03-09 NOTE — Progress Notes (Signed)
Pharmacy Antibiotic Note  James Wall is a 52 y.o. male admitted on 03/08/2017 with pneumonia.  Pharmacy has been consulted for vancomycin and cefepime dosing.  Patient has ESRD and is on MWF HD per Renal, session today.  Tmax 102.5, WBC WNL, LA 2.  Plan: Vanc 1gm IV qHD MWF Cefepime 1gm IV Q24H Monitor HD schedule/tolerance, clinical progress, vanc pre-HD level as indicated   Height: 5' 5"  (165.1 cm) Weight: 206 lb 9.6 oz (93.7 kg) IBW/kg (Calculated) : 61.5  Temp (24hrs), Avg:100.9 F (38.3 C), Min:99.1 F (37.3 C), Max:102.5 F (39.2 C)  Recent Labs  Lab 03/08/17 1803 03/08/17 1817 03/08/17 2145 03/08/17 2152  WBC 9.2  --   --   --   CREATININE  --   --  20.39*  --   LATICACIDVEN  --  1.06  --  2.0*    Estimated Creatinine Clearance: 4.5 mL/min (A) (by C-G formula based on SCr of 20.39 mg/dL (H)).    Allergies  Allergen Reactions  . Aspirin Other (See Comments)    Made congestion worse    Vanc 2/3 >> Cefepime 2/3 >>  2/3 BCx -     Kelten Enochs D. Mina Marble, PharmD, BCPS Pager:  [phone] 03/09/2017, 7:30 AM

## 2017-03-09 NOTE — Progress Notes (Signed)
   03/09/17 0619  Vitals  Temp 99.1 F (37.3 C)  Temp Source Oral  BP (!) 189/102  BP Location Right Arm  BP Method Automatic  Patient Position (if appropriate) Lying  Pulse Rate 99  Pulse Rate Source Dinamap  Resp (!) 22  Oxygen Therapy  SpO2 94 %  O2 Device Room Air  Height and Weight  Weight 93.7 kg (206 lb 9.6 oz)  Type of Scale Used Standing  Type of Weight Actual  BMI (Calculated) 34.38  Admitted pt to room 3E05 from ED, pt alert and oriented, placed on cardiac monitor, oriented to room, call bell placed within reach. BP elevated, prn hydralazine 91m IV given. Will re check BP in an hour.

## 2017-03-09 NOTE — Progress Notes (Signed)
PROGRESS NOTE   James Wall  VQM:[id number]    DOB: 03-20-1965    DOA: 03/08/2017  PCP: Lottie Dawson, MD   I have briefly reviewed patients previous medical records in [facility].  Brief Narrative:  52 year old male with PMH of ESRD on MWF HD, HTN, CAD, COPD, chronic intermittent nausea and vomiting, cirrhosis, presented to ED with 1-2 days history of fevers, chills, nausea, vomiting, left sided upper abdominal pain/chest pain worse with coughing.  Some reported blood in stool.  CT abdomen and pelvis showed findings suggestive of pneumonia, no acute abdominal findings.  Admitted for sepsis, possibly due to pneumonia but cannot rule out HD access related infection.  Nephrology consulted.   Assessment & Plan:   Principal Problem:   Sepsis due to pneumonia [facility]) Active Problems:   GERD (gastroesophageal reflux disease)   Anemia of renal disease   ESRD on dialysis ([place])   Hyperkalemia   Hypertensive urgency   1. Sepsis: Secondary to pneumonia but cannot rule out bacteremia related to HD access.  Met sepsis criteria on admission.  Urine microscopy not indicative of UTI.  CT abdomen and pelvis without contrast shows findings concerning for pneumonia.  Blood cultures x2: Negative to date.  MRSA PCR negative.  Empirically started on IV vancomycin and cefepime, continue for now.  Flu panel PCR negative.  Pro-calcitonin 17.38. 2. HCAP: IV cefepime and vancomycin. 3. Hyperkalemia: Presented with potassium of 6.7.  Had EKG changes associated with it.  Received calcium gluconate and Kayexalate in the ED.  Nephrology consulted.  Underwent brief/1 hour HD, unable to tolerate due to fevers.  Potassium has normalized. 4. ESRD on MWF HD: Last dialysis prior to admission 1/30.  Patient having fevers, rigors and tachycardia at HD after an hour.  HD had to be discontinued prematurely.  Patient has right IJ TDC (changed 03/05/17) and may have catheter related sepsis.  Blood culture  drawn from [facility] at dialysis.  Continue current antibiotics.  Nephrology input appreciated. 5. Hypertensive urgency: Continue clonidine 0.3 mg patch and as needed IV hydralazine.  May need to make further adjustments. 6. Chest/abdominal pain: Possibly related to pneumonia and coughing.  No acute findings noted on noncontrasted CT abdomen and pelvis.  Adjusted pain medications.  Monitor closely. 7. Nausea and vomiting: Acute on chronic.  Treat supportively.  Continue PPI. 8. Anemia of chronic disease/chronic kidney disease: Patient reported 3 episodes of intermittent mild rectal bleeding.  Question hemorrhoids/fissures versus diverticular disease.  Denies recent receptive anal sex.  No bleeding reported since hospital admission.  Avoid heparin.  Follow CBCs daily.  If recurs or worsens, consider GI consultation.  Currently would not be stable to undergo prep for colonoscopy due to febrile illness. 9. Rectal bleeding: See above. 10. GERD: IV PPI. 11. Hemorrhagic and nonhemorrhagic renal cysts:?  Etiology.?  Related to ESRD and HD.  Could this be causing abdominal pain, nausea and vomiting >nephrology to comment.   DVT prophylaxis: SCDs Code Status: Full Family Communication: None at bedside Disposition: Discharge home pending clinical improvement.   Consultants:  Nephrology  Procedures:  HD 2/4  Antimicrobials:  IV cefepime and vancomycin   Subjective: Seen this morning while he was coming off HD.  Fevers, chills, rigors.  Reported anterior chest and left upper quadrant abdominal pain, worse with coughing.  Briefly relieved with IV pain medications.  Nonproductive cough.  Denies dyspnea.  No rectal bleeding reported.  ROS: As above  Objective:  Vitals:   03/09/17 1055 03/09/17  1208 03/09/17 1320 03/09/17 1603  BP: (!) 210/109 (!) 164/91    Pulse: (!) 115 (!) 110    Resp: (!) 25     Temp: (!) 102.3 F (39.1 C) (!) 102.1 F (38.9 C) 99.8 F (37.7 C) 98.8 F (37.1 C)  TempSrc:  Oral Oral Oral Oral  SpO2: 94% (!) 81%    Weight:      Height:        Examination:  General exam: Middle-aged male, moderately built and nourished, slightly looking, lying propped up in bed. Respiratory system: Slightly harsh breath sounds posteriorly.  Clear anteriorly.  Occasional basal crackles.  No wheezing or rhonchi. Respiratory effort normal. Cardiovascular system: S1 & S2 heard, RRR. No JVD, murmurs, rubs, gallops or clicks. No pedal edema.  Telemetry: Sinus rhythm. Gastrointestinal system: Abdomen is nondistended, soft and nontender. No organomegaly or masses felt. Normal bowel sounds heard. Central nervous system: Alert and oriented. No focal neurological deficits. Extremities: Symmetric 5 x 5 power. Skin: No rashes, lesions or ulcers Psychiatry: Judgement and insight appear normal. Mood & affect appropriate.     Data Reviewed: I have personally reviewed following labs and imaging studies  CBC: Recent Labs  Lab 03/08/17 1803 03/09/17 0800  WBC 9.2 11.2*  NEUTROABS 7.3 9.4*  HGB 10.4* 9.8*  HCT 31.4* 29.8*  MCV 100.6* 101.4*  PLT 219 284   Basic Metabolic Panel: Recent Labs  Lab 03/08/17 2145 03/09/17 0800 03/09/17 1317  NA 134* 134* 133*  K 6.7* 5.8* 4.9  CL 92* 92* 94*  CO2 20* 18* 19*  GLUCOSE 79 78 80  BUN 68* 74* 54*  CREATININE 20.39* 20.58* 15.97*  CALCIUM 8.7* 8.5* 8.2*  PHOS  --  4.7*  --    Liver Function Tests: Recent Labs  Lab 03/08/17 2145 03/09/17 0800  AST 18  --   ALT 9*  --   ALKPHOS 49  --   BILITOT 0.8  --   PROT 6.9  --   ALBUMIN 3.4* 3.0*     Recent Results (from the past 240 hour(s))  Blood culture (routine x 2)     Status: None (Preliminary result)   Collection Time: 03/08/17  6:03 PM  Result Value Ref Range Status   Specimen Description BLOOD LEFT ARM  Final   Special Requests IN PEDIATRIC BOTTLE Blood Culture adequate volume  Final   Culture   Final    NO GROWTH < 24 HOURS Performed at [facility],  [address], [place], [place] [address]    Report Status PENDING  Incomplete  MRSA PCR Screening     Status: None   Collection Time: 03/09/17  6:16 AM  Result Value Ref Range Status   MRSA by PCR NEGATIVE NEGATIVE Final    Comment:        The GeneXpert MRSA Assay (FDA approved for NASAL specimens only), is one component of a comprehensive MRSA colonization surveillance program. It is not intended to diagnose MRSA infection nor to guide or monitor treatment for MRSA infections. Performed at [facility], [place] [address] [place], [place] [address]          Radiology Studies: Ct Abdomen Pelvis Wo Contrast  Result Date: 03/08/2017 CLINICAL DATA:  Chills, abdominal pain and vomiting. New dialysis catheter placed three days ago. EXAM: CT ABDOMEN AND PELVIS WITHOUT CONTRAST TECHNIQUE: Multidetector CT imaging of the abdomen and pelvis was performed following the standard protocol without IV contrast. COMPARISON:  04/19/2013. FINDINGS:  Lower chest: Patchy opacity in both lower lobes, greater on the right. Mild interstitial prominence at both lung bases. Prominent pulmonary vasculature. Diffuse peribronchial thickening. Left lower lobe bullous change. Enlarged heart. Hepatobiliary: No focal liver abnormality is seen. No gallstones, gallbladder wall thickening, or biliary dilatation. Pancreas: Unremarkable. No pancreatic ductal dilatation or surrounding inflammatory changes. Spleen: Normal in size without focal abnormality. Adrenals/Urinary Tract: Normal appearing adrenal glands. Small kidneys with multiple hemorrhagic and nonhemorrhagic cysts. Poorly distended urinary bladder. No ureteral abnormalities seen. Stomach/Bowel: Stomach is within normal limits. Appendix appears normal. No evidence of bowel wall thickening, distention, or inflammatory changes. Vascular/Lymphatic: Atheromatous arterial calcifications without aneurysm. No enlarged lymph nodes. Reproductive: Prostate is unremarkable.  Other: Small bilateral inguinal hernias containing fat. Small umbilical and left paraumbilical hernia containing fat. Musculoskeletal: Diffuse bone sclerosis. Lower thoracic spine degenerative changes. IMPRESSION: 1. Patchy opacity in both lower lobes, greater on the right, suspicious for pneumonia. 2. Changes of COPD and bronchitis at the lung bases with pulmonary vascular congestion. 3. Cardiomegaly. 4. Atrophied kidneys containing multiple small hemorrhagic and nonhemorrhagic cysts. 5. Diffuse bone sclerosis, compatible with renal osteodystrophy. Electronically Signed   By: Claudie Revering M.D.   On: 03/08/2017 19:04   Dg Chest 2 View  Result Date: 03/08/2017 CLINICAL DATA:  Patient had new dialysis catheter placed this past Thursday and yesterday developed chills and abdominal soreness. Has had vomiting today. hx smoking but recently quit. Fever today. EXAM: CHEST  2 VIEW COMPARISON:  07/09/2013 FINDINGS: Right IJ dialysis catheter tip overlies the level of the lower superior vena cava. Heart is enlarged. There are no focal consolidations or pleural effusions. No pulmonary edema. No pneumothorax. IMPRESSION: Stable cardiomegaly.  No evidence for acute pulmonary abnormality. Electronically Signed   By: Nolon Nations M.D.   On: 03/08/2017 14:48        Scheduled Meds: . acetaminophen  650 mg Oral Once  . albuterol  10 mg Nebulization Once  . cinacalcet  60 mg Oral Q M,W,F-HD  . cloNIDine  0.3 mg Transdermal Q Sat  . guaiFENesin  600 mg Oral BID  . pantoprazole  80 mg Oral Daily  . sevelamer carbonate  3,200 mg Oral Q breakfast  . tamsulosin  0.4 mg Oral QODAY   Continuous Infusions: . ceFEPime (MAXIPIME) IV Stopped (03/08/17 2324)  . vancomycin Stopped (03/09/17 1530)     LOS: 0 days     Vernell Leep, MD, FACP, [facility]. [facility] Pager [phone]  If 7PM-7AM, please contact night-coverage [email or web address] Password [email or web address] 03/09/2017, 5:01 PM

## 2017-03-09 NOTE — Progress Notes (Signed)
HD tx stopped and Dr. Jonnie Finner made aware after pt noted with sustained HR >160; SBP>200, Fever=103.3 with chills. Pt states he is very cold; Tylenol 629m PO for the fever; EKG ordered and done; blood culturex1 sent to labs per Dr. RRoney Jaffeorder; and hydralazine 141mIV given for increased SBP. After 30 mins pt HR 116; temp 102.3; SBP in the low 200's from 230's. Dr. HoAlgis Limingame and assessed pt on the HD unit. Pt transported back to his room after 1.5 hrs of HD.

## 2017-03-09 NOTE — Procedures (Signed)
   I was present at this dialysis session, have reviewed the session itself and made  appropriate changes Kelly Splinter MD [place] pager [phone]   03/09/2017, 9:28 AM

## 2017-03-09 NOTE — Progress Notes (Signed)
[facility] Progress Note  Subjective: no new c/o  Vitals:   03/09/17 0500 03/09/17 0515 03/09/17 0619 03/09/17 0722  BP: (!) 194/116 (!) 185/107 (!) 189/102 (!) 168/90  Pulse:   99 88  Resp: (!) 51  (!) 22   Temp:   99.1 F (37.3 C)   TempSrc:   Oral   SpO2:   94% 92%  Weight:   93.7 kg (206 lb 9.6 oz)   Height:        Inpatient medications: . acetaminophen  650 mg Oral Once  . albuterol  10 mg Nebulization Once  . cinacalcet  60 mg Oral Q M,W,F-HD  . cloNIDine  0.3 mg Transdermal Q Sat  . guaiFENesin  600 mg Oral BID  . pantoprazole  80 mg Oral Daily  . sevelamer carbonate  3,200 mg Oral Q breakfast  . tamsulosin  0.4 mg Oral QODAY   . ceFEPime (MAXIPIME) IV Stopped (03/08/17 2324)  . vancomycin     diphenhydrAMINE **AND** acetaminophen, gi cocktail, hydrALAZINE, HYDROmorphone (DILAUDID) injection, hydrOXYzine, ipratropium-albuterol, ondansetron **OR** ondansetron (ZOFRAN) IV, sevelamer carbonate, sevelamer carbonate  Exam: On HD, nad NO jvd Chest bibasilar crackles, no ^wob RRR Abd soft ntnd Ext no wounds or edema NF, ox3  Dialysis: [facility]WF [facility] 3.5h   90kg   2/2 bath   TDC (exchanged 1/31 CKV low BFR)  P4   Hep 4000 + 1K mid VDRA: C3 3.55mg qTx PARSABIV: 567mqTx EPO: mircera 60 q2wk, last given 1/30       Impression: 1  ESRD MWF hd, TDC 2  Hyperkalemia 3  HCAP  4  Abd pain/ N/V 5  Chron TDC 6  HTN  7  Anemia ckd on ESA 8  MBD ckd, cont meds   Plan - HD, low K bath, IV abx   RoKelly SplinterD [facility] pager [phone] 03/09/2017, 9:23 AM   Recent Labs  Lab 03/08/17 2145  NA 134*  K 6.7*  CL 92*  CO2 20*  GLUCOSE 79  BUN 68*  CREATININE 20.39*  CALCIUM 8.7*   Recent Labs  Lab 03/08/17 2145  AST 18  ALT 9*  ALKPHOS 49  BILITOT 0.8  PROT 6.9  ALBUMIN 3.4*   Recent Labs  Lab 03/08/17 1803 03/09/17 0800  WBC 9.2 11.2*  NEUTROABS 7.3 9.4*  HGB 10.4* 9.8*  HCT 31.4* 29.8*  MCV 100.6* 101.4*   PLT 219 210   Iron/TIBC/Ferritin/ %Sat    Component Value Date/Time   IRON 58 07/10/2013 1500   TIBC 164 (L) 07/10/2013 1500   FERRITIN 1,060 (H) 07/10/2013 1500   IRONPCTSAT 35 07/10/2013 1500

## 2017-03-09 NOTE — Progress Notes (Signed)
Pt is having rigors and tachycardia , HR's in 170's , has TDC, may have cath sepsis, although was just recently changed.  Not tolerating HD now, will dc HD session, get another BCx first, repeat K later today.  Reassess later.    Kelly Splinter MD [employer] pgr [phone]   03/09/2017, 10:31 AM

## 2017-03-09 NOTE — Progress Notes (Signed)
Influenza test neg.  Discontinued droplet precautions.

## 2017-03-09 NOTE — Consult Note (Signed)
James Wall Admit Date: 03/08/2017 03/09/2017 James Wall Requesting Physician:  James Converse MD  Reason for Consult:  Hyperkalemia, ESRD HPI:  52 year old male with ESRD presented to the emergency room today with fever (T-max 102.5), and abdominal pain.  Workup revealed normal LFTs, white count of 9.2, CT abdomen and pelvis demonstrated patchy opacities in both lower lobes consistent with pneumonia; no acute abdominal findings.  He is being admitted for treatment of pneumonia.  Labs also had a potassium of 6.7.  EKG with no peak T's, normal QRS interval.  He received insulin and dextrose, albuterol.  Kayexalate 30 g ordered.  TDC was exhanged 1/31. Did not rec HD 2/1. Last Tx was 1/30.   Outpt HD Orders Unit: [place] Days: MWF Time: 3h 106mn Dialyzer: F180 EDW: 90kg K/Ca: 2/2 [facility] for chornic sluggish flow) Needle Size: na BFR/DFR: 400/800 UF Proflie: 4 VDRA: C3 3.270m qTx PARSABIV: 46m22mTx EPO: mircera 60 q2wk, last given 1/30 IV Fe: none Heparin: 4000 IBV and 1000 mid Treatment Treatment Adherence: often signs off early    Creatinine, Ser (mg/dL)  Date Value  03/08/2017 20.39 (H)  10/23/2015 16.59 (H)  10/22/2015 23.05 (H)  07/14/2013 6.74 (H)  07/13/2013 8.99 (H)  07/12/2013 6.66 (H)  07/11/2013 9.93 (H)  07/10/2013 7.22 (H)  07/09/2013 9.83 (H)  07/08/2013 8.17 (H)  ]  ROS Balance of 12 systems is negative w/ exceptions as above  PMH  Past Medical History:  Diagnosis Date  . Anemia   . Anxiety   . Arthritis    GOUT - pt not sure if this is true  . AV fistula occlusion (HCC[place]9/2017  . Cirrhosis, nonalcoholic ([facility] . COPD (chronic obstructive pulmonary disease) ([facility]CC[facility] . Depression   . [facility]  Started HD Jan 2009.  ESRD was due to HTN.  Dx'd with HTN in hospital 1996 according to pt, they had to keep him so he could get Medicaid to afford the BP medications.  First saw a nephrologist and  started HD in the same year 2009.  [facility].  Does not have DM. He had a left RC AVF that never functioned, a left upper arm AVF that worked for about 5 years and as of June  . GERD (gastroesophageal reflux disease)   . Hypertension   . Renal insufficiency   . Secondary hyperparathyroidism (HCCR[place] . Sepsis ([facility]CC[place]ennett/2015   from AVF , treated with Vancomycin.  . SMarland Kitchenortness of breath    With exertion  . Sleep apnea    no  longer using cpap   PSH  Past Surgical History:  Procedure Laterality Date  . AV FISTULA PLACEMENT  2009   Left lower arm AVF  . AV FISTULA PLACEMENT Right 02/22/2013   Procedure:  CREATION  OF BRACHIAL CEPHALIC FISTULA RIGHT ARM;  Surgeon: James DutchD;  [facility];  Laterality: Right;  . AV FISTULA PLACEMENT Left 08/10/2014   Procedure: BASILIC VEIN TRANSPOSITION  ARTERIOVENOUS (AV) FISTULA CREATION LEFT UPPER ARM;  Surgeon: James MistyD;  [facility] [place];  Laterality: Left;  . COLONOSCOPY    . ESOPHAGOGASTRODUODENOSCOPY (EGD) WITH PROPOFOL N/A 04/12/2013   Procedure: ESOPHAGOGASTRODUODENOSCOPY (EGD) WITH PROPOFOL;  Surgeon: WilArta SilenceD;  [facility] ENDOS[facility];  Service: Endoscopy;  Laterality: N/A;  . INSERTION OF DIALYSIS CATHETER N/A  12/23/2012   Procedure: INSERTION OF DIALYSIS CATHETER; ULTRASOUND GUIDED;  Surgeon: James Mould, MD;  [facility];  Service: Vascular;  Laterality: N/A;  . INSERTION OF DIALYSIS CATHETER  10/22/2015   Right IJ non-tunneled HD catheter  . IR GENERIC HISTORICAL  10/22/2015   IR US GUIDE VASC ACCESS RIGHT 10/22/2015 [facility]INTER[facility]  . IR GENERIC HISTORICAL  10/22/2015   IR FLUORO GUIDE CV LINE RIGHT 10/22/2015 [facility]  . IR GENERIC HISTORICAL  10/23/2015   IR FLUORO GUIDE CV LINE RIGHT 10/23/2015 James Killings, MD [facility]INTER[facility] RAD  . LEFT HEART CATHETERIZATION WITH CORONARY ANGIOGRAM N/A 07/13/2013   Procedure: LEFT HEART CATHETERIZATION  WITH CORONARY ANGIOGRAM;  Surgeon: James Booze, MD;  Location: [facility] CATH LAB;  Service: Cardiovascular;  Laterality: N/A;  . LIGATION OF ARTERIOVENOUS  FISTULA Left 12/22/2012   Procedure: LIGATION OF ARTERIOVENOUS  FISTULA;EXCISION OF LARGE ANEURYSMS;;  Surgeon: James Dutch, MD;  Location: [facility] OR;  Service: Vascular;  Laterality: Left;   FH  Family History  Problem Relation Age of Onset  . Hypertension Mother   . Cerebrovascular Accident Father   . Hypertension Father   . Congestive Heart Failure Brother   . Asthma Brother    [name]  reports that he has been smoking cigarettes.  He has a 8.00 pack-year smoking history. he has never used smokeless tobacco. He reports that he drinks alcohol. He reports that he does not use drugs. Allergies  Allergies  Allergen Reactions  . Aspirin Other (See Comments)    Made congestion worse   Home medications Prior to Admission medications   Medication Sig Start Date End Date Taking? Authorizing Provider  acetaminophen (TYLENOL) 650 MG CR tablet Take 650-2,600 mg by mouth daily as needed for pain.   Yes [provider]  cloNIDine (CATAPRES - DOSED IN MG/24 HR) 0.3 mg/24hr Place 0.3 mg onto the skin every Sunday. Saturday.   Yes [provider]  diphenhydramine-acetaminophen (TYLENOL PM) 25-500 MG TABS tablet Take 3 tablets by mouth at bedtime as needed (sleep).   Yes [provider]  hydrOXYzine (ATARAX/VISTARIL) 25 MG tablet Take 25 mg by mouth every 8 (eight) hours as needed for itching.   Yes [provider]  omeprazole (PRILOSEC) 20 MG capsule Take 20 mg by mouth daily as needed (acid reflux).   Yes [provider]  SENSIPAR 60 MG tablet Take 60 mg by mouth every Monday, Wednesday, and Friday with hemodialysis.  11/06/15  Yes [provider]  sevelamer carbonate (RENVELA) 800 MG tablet Take 3,200-4,800 mg by mouth See admin instructions. Take 4-6 tablets (3200-4800 mg) by mouth one to two  times daily with meals   Yes [provider]  tamsulosin (FLOMAX) 0.4 MG CAPS capsule Take 0.4 mg by mouth every other day.  08/01/14  Yes [provider]  atorvastatin (LIPITOR) 20 MG tablet Take 1 tablet (20 mg total) by mouth daily. Patient not taking: Reported on 03/08/2017 06/21/13   Dorothy Spark, MD    Current Medications Scheduled Meds: . acetaminophen  650 mg Oral Once  . albuterol  10 mg Nebulization Once  . dextrose  1 ampule Intravenous Once  . insulin aspart  5 Units Intravenous Once  . iopamidol      . sodium polystyrene  30 g Oral Once   Continuous Infusions: . ceFEPime (MAXIPIME) IV Stopped (03/08/17 2324)  . vancomycin 1,750 mg (03/08/17 2324)   PRN Meds:.  CBC Recent Labs  Lab 03/08/17  1803  WBC 9.2  NEUTROABS 7.3  HGB 10.4*  HCT 31.4*  MCV 100.6*  PLT 774   Basic Metabolic Panel Recent Labs  Lab 03/08/17 2145  NA 134*  K 6.7*  CL 92*  CO2 20*  GLUCOSE 79  BUN 68*  CREATININE 20.39*  CALCIUM 8.7*    Physical Exam  Blood pressure (!) 169/99, pulse 69, temperature (!) 101 F (38.3 C), temperature source Oral, resp. rate (!) 42, height 5' 5"  (1.651 m), weight 83.9 kg (185 lb), SpO2 95 %. GEN: in chair at bedside, appears anxiosu ENT: NCAT EYES: EOMI CV: RRR PULM: Crackles bilateral bases, speaks in full sentences ABD: s/nt/nd SKIN: no rashes/lesion; R IJ TDC covered/bandaged NEURO: nonfocal, cn2-12 intact   Assessment 36M with ESRD, Fever, Abd Pain, likely HCAP, and Hyperkalemia  1. ESRD MWF TDC At [place] 2. Hyperkalemia, no EKG changes; s/p insulin/dextrose + albuterol; for kayexlate now 3. HCAP on Vanc/Cefepime 4. Abd Pain, ? 2/2 #3 5. Fever 6. Chronic TDC, exchanged 03/05/17 7. HTN 8. Anemia on ESA 9. 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 HD First Shift tomorrow in AM, 1K for 54mn then 2K    RPearson GrippeMD [phone] 03/09/2017, 12:07 AM

## 2017-03-10 ENCOUNTER — Encounter (HOSPITAL_COMMUNITY): Payer: Self-pay | Admitting: General Practice

## 2017-03-10 LAB — RESPIRATORY PANEL BY PCR
Adenovirus: NOT DETECTED
BORDETELLA PERTUSSIS-RVPCR: NOT DETECTED
CORONAVIRUS 229E-RVPPCR: NOT DETECTED
CORONAVIRUS OC43-RVPPCR: NOT DETECTED
Chlamydophila pneumoniae: NOT DETECTED
Coronavirus HKU1: NOT DETECTED
Coronavirus NL63: NOT DETECTED
Influenza A: NOT DETECTED
Influenza B: NOT DETECTED
METAPNEUMOVIRUS-RVPPCR: NOT DETECTED
Mycoplasma pneumoniae: NOT DETECTED
PARAINFLUENZA VIRUS 1-RVPPCR: NOT DETECTED
PARAINFLUENZA VIRUS 2-RVPPCR: NOT DETECTED
PARAINFLUENZA VIRUS 3-RVPPCR: NOT DETECTED
Parainfluenza Virus 4: NOT DETECTED
RHINOVIRUS / ENTEROVIRUS - RVPPCR: NOT DETECTED
Respiratory Syncytial Virus: NOT DETECTED

## 2017-03-10 LAB — CBC
HCT: 26.7 % — ABNORMAL LOW (ref 39.0–52.0)
Hemoglobin: 8.9 g/dL — ABNORMAL LOW (ref 13.0–17.0)
MCH: 33.8 pg (ref 26.0–34.0)
MCHC: 33.3 g/dL (ref 30.0–36.0)
MCV: 101.5 fL — AB (ref 78.0–100.0)
PLATELETS: 180 10*3/uL (ref 150–400)
RBC: 2.63 MIL/uL — AB (ref 4.22–5.81)
RDW: 15.6 % — ABNORMAL HIGH (ref 11.5–15.5)
WBC: 9.7 10*3/uL (ref 4.0–10.5)

## 2017-03-10 MED ORDER — SEVELAMER CARBONATE 800 MG PO TABS
3200.0000 mg | ORAL_TABLET | Freq: Three times a day (TID) | ORAL | Status: DC
  Administered 2017-03-10 – 2017-03-11 (×3): 3200 mg via ORAL
  Filled 2017-03-10 (×4): qty 4

## 2017-03-10 MED ORDER — GUAIFENESIN ER 600 MG PO TB12
1200.0000 mg | ORAL_TABLET | Freq: Two times a day (BID) | ORAL | Status: DC
  Administered 2017-03-10 – 2017-03-17 (×7): 1200 mg via ORAL
  Filled 2017-03-10 (×11): qty 2

## 2017-03-10 NOTE — Progress Notes (Signed)
Pt slept well overnight, IV dilaudid given throughout the time for a complain of abdominal pain, others no any complain of chest pain, SOB and distress, oxygen continue just for the comfort, will wean off at morning,  will continue to monitor the patient  Palma Holter, RN

## 2017-03-10 NOTE — Progress Notes (Signed)
[facility] Progress Note  Subjective: feeling better, shortened HD yest due to severe rigors and tachycardia. Blood cx's neg to date.  Temps down, no sig cough, CP, or SOB.    Vitals:   03/09/17 1320 03/09/17 1603 03/09/17 2122 03/10/17 0507  BP:   (!) 136/91 124/72  Pulse:   68 64  Resp:    19  Temp: 99.8 F (37.7 C) 98.8 F (37.1 C) 98.7 F (37.1 C) 98.3 F (36.8 C)  TempSrc: Oral Oral Oral Oral  SpO2:   95% 99%  Weight:    92.7 kg (204 lb 4.8 oz)  Height:        Inpatient medications: . albuterol  10 mg Nebulization Once  . cinacalcet  60 mg Oral Q M,W,F-HD  . cloNIDine  0.3 mg Transdermal Q Sat  . guaiFENesin  1,200 mg Oral BID  . pantoprazole  80 mg Oral Daily  . sevelamer carbonate  3,200 mg Oral TID WC  . tamsulosin  0.4 mg Oral QODAY   . ceFEPime (MAXIPIME) IV Stopped (03/09/17 2020)  . vancomycin Stopped (03/09/17 1530)   acetaminophen, diphenhydrAMINE **AND** [DISCONTINUED] acetaminophen, gi cocktail, hydrALAZINE, HYDROmorphone (DILAUDID) injection, hydrOXYzine, ipratropium-albuterol, ondansetron **OR** ondansetron (ZOFRAN) IV, oxyCODONE  Exam: On HD, nad NO jvd Chest bibasilar crackles, no ^wob RRR Abd soft ntnd Ext no wounds or edema NF, ox3  Dialysis: MWF [facility] 3.5h   90kg   2/2 bath   TDC (exchanged 1/31 CKV low BFR)  P4   Hep 4000 + 1K mid VDRA: C3 3.91mg qTx PARSABIV: 58mqTx EPO: mircera 60 q2wk, last given 1/30       Impression: 1 Fevers , prob PNA vs viral, [facility] 2 ESRD short HD yest d/t tachycardia 3 Hyperkalemia, better  4  Abd pain/ N/V, improving 5  Chron TDC 6  HTN  7  Anemia ckd on ESA 8  MBD ckd, cont meds   Plan - plan HD tomorrow on schedule   RoKelly SplinterD [facility] pager [phone] 03/10/2017, 1:04 PM   Recent Labs  Lab 03/08/17 2145 03/09/17 0800 03/09/17 1317  NA 134* 134* 133*  K 6.7* 5.8* 4.9  CL 92* 92* 94*  CO2 20* 18* 19*  GLUCOSE 79 78 80   BUN 68* 74* 54*  CREATININE 20.39* 20.58* 15.97*  CALCIUM 8.7* 8.5* 8.2*  PHOS  --  4.7*  --    Recent Labs  Lab 03/08/17 2145 03/09/17 0800  AST 18  --   ALT 9*  --   ALKPHOS 49  --   BILITOT 0.8  --   PROT 6.9  --   ALBUMIN 3.4* 3.0*   Recent Labs  Lab 03/08/17 1803 03/09/17 0800 03/10/17 0450  WBC 9.2 11.2* 9.7  NEUTROABS 7.3 9.4*  --   HGB 10.4* 9.8* 8.9*  HCT 31.4* 29.8* 26.7*  MCV 100.6* 101.4* 101.5*  PLT 219 210 180   Iron/TIBC/Ferritin/ %Sat    Component Value Date/Time   IRON 58 07/10/2013 1500   TIBC 164 (L) 07/10/2013 1500   FERRITIN 1,060 (H) 07/10/2013 1500   IRONPCTSAT 35 07/10/2013 1500

## 2017-03-10 NOTE — Progress Notes (Signed)
PROGRESS NOTE    James Wall  ZOX:[id number] DOB: 11/11/1965 DOA: 03/08/2017 PCP: Lottie Dawson, MD   Brief Narrative:  The patient is a 52 year old male with PMH of ESRD on MWF HD, HTN, CAD, COPD, chronic intermittent nausea and vomiting, cirrhosis, presented to ED with 1-2 days history of fevers, chills, nausea, vomiting, left sided upper abdominal pain/chest pain worse with coughing.  Some reported blood in stool.  CT abdomen and pelvis showed findings suggestive of pneumonia, no acute abdominal findings.  Admitted for sepsis, possibly due to pneumonia but less likely  HD access related infection given recent catheter change. Nephrology consulted and patient getting dialyzed tomorrow.  Assessment & Plan:   Principal Problem:   Sepsis due to pneumonia [facility]) Active Problems:   GERD (gastroesophageal reflux disease)   Anemia of renal disease   ESRD on dialysis ([place])   Hyperkalemia   Hypertensive urgency  Sepsis:  -Secondary to HCAP pneumonia. -Less Likely Catheter infection as Blood x 2 Negative and Negative drawn from Catheter.   -Met sepsis criteria on admission.   -Urine microscopy not indicative of UTI.   -CT abdomen and pelvis without contrast shows findings concerning for pneumonia.   -MRSA PCR negative.   -Empirically started on IV vancomycin and cefepime, continue for now.   -Flu panel and Respiratory Virus Panel Negative PCR negative.   -Pro-calcitonin 17.38. -Repeat CXR in AM -WBC has improved  HCAP:  -IV cefepime and vancomycin. -C/w Nebs scheduled and prn -C/w Muicinex -Repeat CXR in AM   Hyperkalemia:  -Presented with potassium of 6.7.   -Had EKG changes associated with it.   -Received calcium gluconate and Kayexalate in the ED.  -Nephrology consulted.   -Underwent brief/1 hour HD, unable to tolerate due to fevers.   -Potassium has normalized. -Repeat HD in AM  ESRD on MWF HD:  -Last dialysis prior to admission 1/30.   -Patient  having fevers, rigors and tachycardia at HD after an hour.   -HD had to be discontinued prematurely.   -Patient has right IJ TDC (changed 03/05/17) and there was a concern for catheter related sepsis.   -Blood culture drawn from [facility] at dialysis Negative. -Continue current antibiotics.   -Nephrology input appreciated. -C/w Sensipar and Sevelamer  Hypertensive urgency:  -Continue clonidine 0.3 mg patch and as needed IV hydralazine.  May need to make further adjustments.  Chest/abdominal pain:  -Possibly related to pneumonia and coughing.   -No acute findings noted on noncontrasted CT abdomen and pelvis.   -Adjusted pain medications.  Monitor closely.  Nausea and vomiting:  -Acute on chronic.  Treat supportively.  Continue PPI.  Anemia of chronic disease/chronic kidney disease:  -Patient reported 3 episodes of intermittent mild rectal bleeding.   -Question hemorrhoids/fissures versus diverticular disease.   -Denies recent receptive anal sex.  No bleeding reported since hospital admission.   -Avoid heparin.   -Follow CBCs daily. HB/Hct now decreased to 8.8/26.7 - If recurs or worsens, consider GI consultation.   -Currently would not be stable to undergo prep for colonoscopy due to febrile illness.  Rectal bleeding:  -See above.  GERD:  -C/w PPI.  Hemorrhagic and nonhemorrhagic renal cysts: -?  Etiology.?  Related to ESRD and HD.   -Could this be causing abdominal pain, nausea and vomiting >nephrology to comment.  DVT prophylaxis: SCDs Code Status: FULL CODE Family Communication: No family present at bedside  Disposition Plan: Remain Inpatient for further treatment   Consultants:   Nephrology Dr. Roney Jaffe  Procedures:    Antimicrobials:  Anti-infectives (From admission, onward)   Start     Dose/Rate Route Frequency Ordered Stop   03/09/17 1200  vancomycin (VANCOCIN) IVPB 1000 mg/200 mL premix     1,000 mg 200 mL/hr over 60 Minutes Intravenous Every M-W-F  (Hemodialysis) 03/09/17 0733     03/08/17 2045  vancomycin (VANCOCIN) 1,750 mg in sodium chloride 0.9 % 500 mL IVPB     1,750 mg 250 mL/hr over 120 Minutes Intravenous  Once 03/08/17 2033 03/09/17 0231   03/08/17 2045  ceFEPIme (MAXIPIME) 1 g in dextrose 5 % 50 mL IVPB     1 g 100 mL/hr over 30 Minutes Intravenous Every 24 hours 03/08/17 2033       Subjective: Seen and examined and was in NAD. Not coughing up much. Tearful mood and affect about mother passing away on Christmas. No CP today.   Objective: Vitals:   03/09/17 1320 03/09/17 1603 03/09/17 2122 03/10/17 0507  BP:   (!) 136/91 124/72  Pulse:   68 64  Resp:    19  Temp: 99.8 F (37.7 C) 98.8 F (37.1 C) 98.7 F (37.1 C) 98.3 F (36.8 C)  TempSrc: Oral Oral Oral Oral  SpO2:   95% 99%  Weight:    92.7 kg (204 lb 4.8 oz)  Height:        Intake/Output Summary (Last 24 hours) at 03/10/2017 0755 Last data filed at 03/10/2017 0400 Gross per 24 hour  Intake 300 ml  Output 278 ml  Net 22 ml   Filed Weights   03/09/17 0619 03/09/17 0853 03/10/17 0507  Weight: 93.7 kg (206 lb 9.6 oz) 94 kg (207 lb 3.7 oz) 92.7 kg (204 lb 4.8 oz)   Examination: Physical Exam:  Constitutional: WN/WD AAM in NAD and appears calm and comfortable Eyes: Lids and conjunctivae normal, sclerae anicteric  ENMT: External Ears, Nose appear normal. Grossly normal hearing. Mucous membranes are moist.  Neck: Appears normal, supple, no cervical masses, normal ROM, no appreciable thyromegaly, no JVD Respiratory: Diminished to auscultation bilaterally, no wheezing, rales, rhonchi or crackles. Normal respiratory effort and patient is not tachypenic. No accessory muscle use.  Cardiovascular: RRR, no murmurs / rubs / gallops. S1 and S2 auscultated. Mild LE Exema Abdomen: Soft, non-tender, Distended due to body habitus. No masses palpated. No appreciable hepatosplenomegaly. Bowel sounds positive.  GU: Deferred. Musculoskeletal: No clubbing / cyanosis of  digits/nails. No joint deformity upper and lower extremities.  Skin: No rashes, lesions, ulcers on a limited skin evaluation. No induration; Warm and dry.  Neurologic: CN 2-12 grossly intact with no focal deficits.  Romberg sign cerebellar reflexes not assessed.  Psychiatric: Normal judgment and insight. Alert and oriented x 3. Tearful mood and flat affect.   Data Reviewed: I have personally reviewed following labs and imaging studies  CBC: Recent Labs  Lab 03/08/17 1803 03/09/17 0800 03/10/17 0450  WBC 9.2 11.2* 9.7  NEUTROABS 7.3 9.4*  --   HGB 10.4* 9.8* 8.9*  HCT 31.4* 29.8* 26.7*  MCV 100.6* 101.4* 101.5*  PLT 219 210 277   Basic Metabolic Panel: Recent Labs  Lab 03/08/17 2145 03/09/17 0800 03/09/17 1317  NA 134* 134* 133*  K 6.7* 5.8* 4.9  CL 92* 92* 94*  CO2 20* 18* 19*  GLUCOSE 79 78 80  BUN 68* 74* 54*  CREATININE 20.39* 20.58* 15.97*  CALCIUM 8.7* 8.5* 8.2*  PHOS  --  4.7*  --    GFR: Estimated Creatinine Clearance:  5.7 mL/min (A) (by C-G formula based on SCr of 15.97 mg/dL (H)). Liver Function Tests: Recent Labs  Lab 03/08/17 2145 03/09/17 0800  AST 18  --   ALT 9*  --   ALKPHOS 49  --   BILITOT 0.8  --   PROT 6.9  --   ALBUMIN 3.4* 3.0*   No results for input(s): LIPASE, AMYLASE in the last 168 hours. No results for input(s): AMMONIA in the last 168 hours. Coagulation Profile: No results for input(s): INR, PROTIME in the last 168 hours. Cardiac Enzymes: No results for input(s): CKTOTAL, CKMB, CKMBINDEX, TROPONINI in the last 168 hours. BNP (last 3 results) No results for input(s): PROBNP in the last 8760 hours. HbA1C: No results for input(s): HGBA1C in the last 72 hours. CBG: No results for input(s): GLUCAP in the last 168 hours. Lipid Profile: No results for input(s): CHOL, HDL, LDLCALC, TRIG, CHOLHDL, LDLDIRECT in the last 72 hours. Thyroid Function Tests: No results for input(s): TSH, T4TOTAL, FREET4, T3FREE, THYROIDAB in the last 72  hours. Anemia Panel: No results for input(s): VITAMINB12, FOLATE, FERRITIN, TIBC, IRON, RETICCTPCT in the last 72 hours. Sepsis Labs: Recent Labs  Lab 03/08/17 1817 03/08/17 2152 03/09/17 0800  PROCALCITON  --   --  17.38  LATICACIDVEN 1.06 2.0*  --     Recent Results (from the past 240 hour(s))  Blood culture (routine x 2)     Status: None (Preliminary result)   Collection Time: 03/08/17  6:03 PM  Result Value Ref Range Status   Specimen Description BLOOD LEFT ARM  Final   Special Requests IN PEDIATRIC BOTTLE Blood Culture adequate volume  Final   Culture   Final    NO GROWTH < 24 HOURS Performed at [facility], [place] [address], [place], [place] [address]    Report Status PENDING  Incomplete  MRSA PCR Screening     Status: None   Collection Time: 03/09/17  6:16 AM  Result Value Ref Range Status   MRSA by PCR NEGATIVE NEGATIVE Final    Comment:        The GeneXpert MRSA Assay (FDA approved for NASAL specimens only), is one component of a comprehensive MRSA colonization surveillance program. It is not intended to diagnose MRSA infection nor to guide or monitor treatment for MRSA infections. Performed at [facility], [place] [address] [place], [place] [address]     Radiology Studies: Ct Abdomen Pelvis Wo Contrast  Result Date: 03/08/2017 CLINICAL DATA:  Chills, abdominal pain and vomiting. New dialysis catheter placed three days ago. EXAM: CT ABDOMEN AND PELVIS WITHOUT CONTRAST TECHNIQUE: Multidetector CT imaging of the abdomen and pelvis was performed following the standard protocol without IV contrast. COMPARISON:  04/19/2013. FINDINGS: Lower chest: Patchy opacity in both lower lobes, greater on the right. Mild interstitial prominence at both lung bases. Prominent pulmonary vasculature. Diffuse peribronchial thickening. Left lower lobe bullous change. Enlarged heart. Hepatobiliary: No focal liver abnormality is seen. No gallstones, gallbladder wall thickening, or  biliary dilatation. Pancreas: Unremarkable. No pancreatic ductal dilatation or surrounding inflammatory changes. Spleen: Normal in size without focal abnormality. Adrenals/Urinary Tract: Normal appearing adrenal glands. Small kidneys with multiple hemorrhagic and nonhemorrhagic cysts. Poorly distended urinary bladder. No ureteral abnormalities seen. Stomach/Bowel: Stomach is within normal limits. Appendix appears normal. No evidence of bowel wall thickening, distention, or inflammatory changes. Vascular/Lymphatic: Atheromatous arterial calcifications without aneurysm. No enlarged lymph nodes. Reproductive: Prostate is unremarkable. Other: Small bilateral inguinal hernias containing fat. Small  umbilical and left paraumbilical hernia containing fat. Musculoskeletal: Diffuse bone sclerosis. Lower thoracic spine degenerative changes. IMPRESSION: 1. Patchy opacity in both lower lobes, greater on the right, suspicious for pneumonia. 2. Changes of COPD and bronchitis at the lung bases with pulmonary vascular congestion. 3. Cardiomegaly. 4. Atrophied kidneys containing multiple small hemorrhagic and nonhemorrhagic cysts. 5. Diffuse bone sclerosis, compatible with renal osteodystrophy. Electronically Signed   By: Claudie Revering M.D.   On: 03/08/2017 19:04   Dg Chest 2 View  Result Date: 03/08/2017 CLINICAL DATA:  Patient had new dialysis catheter placed this past Thursday and yesterday developed chills and abdominal soreness. Has had vomiting today. hx smoking but recently quit. Fever today. EXAM: CHEST  2 VIEW COMPARISON:  07/09/2013 FINDINGS: Right IJ dialysis catheter tip overlies the level of the lower superior vena cava. Heart is enlarged. There are no focal consolidations or pleural effusions. No pulmonary edema. No pneumothorax. IMPRESSION: Stable cardiomegaly.  No evidence for acute pulmonary abnormality. Electronically Signed   By: Nolon Nations M.D.   On: 03/08/2017 14:48   Scheduled Meds: . albuterol  10 mg  Nebulization Once  . cinacalcet  60 mg Oral Q M,W,F-HD  . cloNIDine  0.3 mg Transdermal Q Sat  . guaiFENesin  600 mg Oral BID  . pantoprazole  80 mg Oral Daily  . sevelamer carbonate  3,200 mg Oral Q breakfast  . tamsulosin  0.4 mg Oral QODAY   Continuous Infusions: . ceFEPime (MAXIPIME) IV Stopped (03/09/17 2020)  . vancomycin Stopped (03/09/17 1530)    LOS: 1 day   Kerney Elbe, DO [facility] Pager [phone]  If 7PM-7AM, please contact night-coverage [email or web address] Password [email or web address] 03/10/2017, 7:55 AM

## 2017-03-11 ENCOUNTER — Inpatient Hospital Stay (HOSPITAL_COMMUNITY): Payer: Medicare Other

## 2017-03-11 ENCOUNTER — Encounter (HOSPITAL_COMMUNITY): Payer: Self-pay | Admitting: Radiology

## 2017-03-11 DIAGNOSIS — D631 Anemia in chronic kidney disease: Secondary | ICD-10-CM

## 2017-03-11 DIAGNOSIS — I160 Hypertensive urgency: Secondary | ICD-10-CM

## 2017-03-11 LAB — CBC WITH DIFFERENTIAL/PLATELET
Basophils Absolute: 0 10*3/uL (ref 0.0–0.1)
Basophils Relative: 0 %
EOS ABS: 0.5 10*3/uL (ref 0.0–0.7)
EOS PCT: 6 %
HCT: 27 % — ABNORMAL LOW (ref 39.0–52.0)
Hemoglobin: 8.9 g/dL — ABNORMAL LOW (ref 13.0–17.0)
LYMPHS ABS: 1.3 10*3/uL (ref 0.7–4.0)
LYMPHS PCT: 16 %
MCH: 32.8 pg (ref 26.0–34.0)
MCHC: 33 g/dL (ref 30.0–36.0)
MCV: 99.6 fL (ref 78.0–100.0)
MONO ABS: 1 10*3/uL (ref 0.1–1.0)
Monocytes Relative: 11 %
Neutro Abs: 5.7 10*3/uL (ref 1.7–7.7)
Neutrophils Relative %: 67 %
PLATELETS: 187 10*3/uL (ref 150–400)
RBC: 2.71 MIL/uL — AB (ref 4.22–5.81)
RDW: 15.1 % (ref 11.5–15.5)
WBC: 8.6 10*3/uL (ref 4.0–10.5)

## 2017-03-11 LAB — COMPREHENSIVE METABOLIC PANEL
ALT: 10 U/L — AB (ref 17–63)
ANION GAP: 22 — AB (ref 5–15)
AST: 15 U/L (ref 15–41)
Albumin: 2.8 g/dL — ABNORMAL LOW (ref 3.5–5.0)
Alkaline Phosphatase: 40 U/L (ref 38–126)
BUN: 75 mg/dL — ABNORMAL HIGH (ref 6–20)
CALCIUM: 8.1 mg/dL — AB (ref 8.9–10.3)
CHLORIDE: 93 mmol/L — AB (ref 101–111)
CO2: 20 mmol/L — AB (ref 22–32)
CREATININE: 20.75 mg/dL — AB (ref 0.61–1.24)
GFR calc Af Amer: 3 mL/min — ABNORMAL LOW (ref >60)
GFR, EST NON AFRICAN AMERICAN: 2 mL/min — AB (ref >60)
Glucose, Bld: 87 mg/dL (ref 65–99)
Potassium: 5.6 mmol/L — ABNORMAL HIGH (ref 3.5–5.1)
SODIUM: 135 mmol/L (ref 135–145)
Total Bilirubin: 1.1 mg/dL (ref 0.3–1.2)
Total Protein: 6.2 g/dL — ABNORMAL LOW (ref 6.5–8.1)

## 2017-03-11 LAB — MAGNESIUM: MAGNESIUM: 2 mg/dL (ref 1.7–2.4)

## 2017-03-11 LAB — STREP PNEUMONIAE URINARY ANTIGEN: Strep Pneumo Urinary Antigen: NEGATIVE

## 2017-03-11 LAB — PHOSPHORUS: PHOSPHORUS: 3.7 mg/dL (ref 2.5–4.6)

## 2017-03-11 MED ORDER — IOPAMIDOL (ISOVUE-300) INJECTION 61%
INTRAVENOUS | Status: AC
  Administered 2017-03-11: 75 mL
  Filled 2017-03-11: qty 75

## 2017-03-11 MED ORDER — VANCOMYCIN HCL IN DEXTROSE 1-5 GM/200ML-% IV SOLN
INTRAVENOUS | Status: AC
  Administered 2017-03-11: 1000 mg via INTRAVENOUS
  Filled 2017-03-11: qty 200

## 2017-03-11 MED ORDER — SEVELAMER CARBONATE 800 MG PO TABS
3200.0000 mg | ORAL_TABLET | Freq: Three times a day (TID) | ORAL | Status: DC
  Filled 2017-03-11: qty 4

## 2017-03-11 NOTE — Plan of Care (Signed)
  Progressing Education: Knowledge of General Education information will improve 03/11/2017 0219 - Progressing by Theador Hawthorne, RN Activity: Risk for activity intolerance will decrease 03/11/2017 0219 - Progressing by Theador Hawthorne, RN Nutrition: Adequate nutrition will be maintained 03/11/2017 0219 - Progressing by Theador Hawthorne, RN Skin Integrity: Risk for impaired skin integrity will decrease 03/11/2017 0219 - Progressing by Theador Hawthorne, RN

## 2017-03-11 NOTE — Progress Notes (Signed)
[facility] Progress Note  Subjective: some L ant abd pain today, o/w no new c/o  Vitals:   03/11/17 1000 03/11/17 1030 03/11/17 1100 03/11/17 1130  BP: (!) 182/108 (!) 208/107 (!) 209/107 (!) 164/132  Pulse: 74 73 77 84  Resp: (!) 24 (!) 24 (!) 29 16  Temp:      TempSrc:      SpO2:      Weight:      Height:        Inpatient medications: . albuterol  10 mg Nebulization Once  . cinacalcet  60 mg Oral Q M,W,F-HD  . cloNIDine  0.3 mg Transdermal Q Sat  . guaiFENesin  1,200 mg Oral BID  . pantoprazole  80 mg Oral Daily  . sevelamer carbonate  3,200 mg Oral TID WC  . tamsulosin  0.4 mg Oral QODAY   . ceFEPime (MAXIPIME) IV Stopped (03/10/17 2307)  . vancomycin 1,000 mg (03/11/17 1135)   acetaminophen, diphenhydrAMINE **AND** [DISCONTINUED] acetaminophen, gi cocktail, hydrALAZINE, HYDROmorphone (DILAUDID) injection, hydrOXYzine, ipratropium-albuterol, ondansetron **OR** ondansetron (ZOFRAN) IV, oxyCODONE  Exam: On HD, nad NO jvd Chest bibasilar crackles, no ^wob RRR Abd soft ntnd Ext no wounds or edema NF, ox3  Dialysis: [facility]WF [place] 3.5h   90kg   2/2 bath   TDC (exchanged 1/31 CKV low BFR)  P4   Hep 4000 + 1K mid VDRA: C3 3.8mg qTx PARSABIV: 58mqTx EPO: mircera 60 q2wk, last given 1/30       Impression: 1  Fevers, improving, pna by CXR 2  ESRD HD mwf 3  Hyperkalemia, mild 4  Abd pain/ N/V, improving 5  Chron TDC 6  HTN BP's high 7  Anemia ckd on ESA 8  MBD ckd, cont meds 9  Vol up 4kg   Plan - HD today, max UF   [facility] pager [phone] 03/11/2017, 11:56 AM   Recent Labs  Lab 03/09/17 0800 03/09/17 1317 03/11/17 0542  NA 134* 133* 135  K 5.8* 4.9 5.6*  CL 92* 94* 93*  CO2 18* 19* 20*  GLUCOSE 78 80 87  BUN 74* 54* 75*  CREATININE 20.58* 15.97* 20.75*  CALCIUM 8.5* 8.2* 8.1*  PHOS 4.7*  --  3.7   Recent Labs  Lab 03/08/17 2145 03/09/17 0800 03/11/17 0542  AST 18  --  15  ALT 9*  --   10*  ALKPHOS 49  --  40  BILITOT 0.8  --  1.1  PROT 6.9  --  6.2*  ALBUMIN 3.4* 3.0* 2.8*   Recent Labs  Lab 03/08/17 1803 03/09/17 0800 03/10/17 0450 03/11/17 0542  WBC 9.2 11.2* 9.7 8.6  NEUTROABS 7.3 9.4*  --  5.7  HGB 10.4* 9.8* 8.9* 8.9*  HCT 31.4* 29.8* 26.7* 27.0*  MCV 100.6* 101.4* 101.5* 99.6  PLT 219 210 180 187   Iron/TIBC/Ferritin/ %Sat    Component Value Date/Time   IRON 58 07/10/2013 1500   TIBC 164 (L) 07/10/2013 1500   FERRITIN 1,060 (H) 07/10/2013 1500   IRONPCTSAT 35 07/10/2013 1500

## 2017-03-11 NOTE — Progress Notes (Signed)
Pt's temp is 102.3 after receiving tylenol at 1445. MD paged.

## 2017-03-11 NOTE — Progress Notes (Signed)
PROGRESS NOTE    James Wall  MLY:[id number] DOB: 10-12-1965 DOA: 03/08/2017 PCP: Leeroy Cha, MD   Brief Narrative: James Wall is a 52 y.o. male with PMH of ESRD on MWF HD, HTN, CAD, COPD, chronic intermittent nausea and vomiting, cirrhosis, presented to ED with 1-2 days history of fevers, chills, nausea, vomiting, left sided upper abdominal pain/chest pain worse with coughing. Some reported blood in stool. CT abdomen and pelvis showed findings suggestive of pneumonia, no acute abdominal findings. Admitted for sepsis, possibly due to pneumonia but less likely  HD access related infection given recent catheter change. Nephrology consulted and patient getting dialyzed tomorrow.     Assessment & Plan:   Principal Problem:   Sepsis due to pneumonia [facility]) Active Problems:   GERD (gastroesophageal reflux disease)   Anemia of renal disease   ESRD on dialysis ([place])   Hyperkalemia   Hypertensive urgency   Sepsis Secondary to HCAP. Blood culture/cath tip no growth.  HCAP Patient still spiking high fevers. WBC wnl.  -Continue Cefepime and vancomycin -CT chest  Hyperkalemia Manage with HD per nephrology  ESRD on MWF HD -Nephrology follow-up  Hypertensive urgency Still elevated. -Continue clonidine -Nephrology recommendations  Chest/abdominal pain Thought secondary to pneumonia. Persistent LUQ pain -CT chest as above  Nausea and vomiting Acute on chronic. Tolerating oral intake.  Anemia of chronic disease Secondary to CKD. Hemoglobin stable. History of ?rectal bleeding. Stable.  GERD -Continue PPI  Hemorrhagic/nonhemorrhagic renal cysts Seen on CT scan incidentally.   DVT prophylaxis: SCDs Code Status: Full code Family Communication: None at bedside Disposition Plan: Discharge when medically stable   Consultants:   Nephrology  Procedures:   Hemodialysis  Antimicrobials:  Vancomycin (2/4>>  Cefepime  (2/4>>   Subjective: LUQ pain. Worse with breathing.  Objective: Vitals:   03/11/17 1130 03/11/17 1200 03/11/17 1213 03/11/17 1614  BP: (!) 164/132 (!) 202/111 (!) 204/113 (!) 150/83  Pulse: 84 83 (!) 108   Resp: 16 20 20    Temp:   (!) 101.4 F (38.6 C) (!) 102.3 F (39.1 C)  TempSrc:   Oral Oral  SpO2:   98%   Weight:   91.6 kg (201 lb 15.1 oz)   Height:        Intake/Output Summary (Last 24 hours) at 03/11/2017 1629 Last data filed at 03/11/2017 1213 Gross per 24 hour  Intake 120 ml  Output 2656 ml  Net -2536 ml   Filed Weights   03/11/17 0609 03/11/17 0850 03/11/17 1213  Weight: 94 kg (207 lb 4.8 oz) 94.2 kg (207 lb 10.8 oz) 91.6 kg (201 lb 15.1 oz)    Examination:  General exam: Appears calm and comfortable Respiratory system: Clear to auscultation but diminished. Respiratory effort normal. Cardiovascular system: S1 & S2 heard, RRR. No murmurs, rubs, gallops or clicks. Gastrointestinal system: Abdomen is nondistended, soft and nontender. Normal bowel sounds heard. Central nervous system: Alert and oriented. No focal neurological deficits. Extremities: No edema. No calf tenderness Skin: No cyanosis. No rashes Psychiatry: Judgement and insight appear normal. Mood & affect appropriate.     Data Reviewed: I have personally reviewed following labs and imaging studies  CBC: Recent Labs  Lab 03/08/17 1803 03/09/17 0800 03/10/17 0450 03/11/17 0542  WBC 9.2 11.2* 9.7 8.6  NEUTROABS 7.3 9.4*  --  5.7  HGB 10.4* 9.8* 8.9* 8.9*  HCT 31.4* 29.8* 26.7* 27.0*  MCV 100.6* 101.4* 101.5* 99.6  PLT 219 210 180 812   Basic Metabolic Panel: Recent  Labs  Lab 03/08/17 2145 03/09/17 0800 03/09/17 1317 03/11/17 0542  NA 134* 134* 133* 135  K 6.7* 5.8* 4.9 5.6*  CL 92* 92* 94* 93*  CO2 20* 18* 19* 20*  GLUCOSE 79 78 80 87  BUN 68* 74* 54* 75*  CREATININE 20.39* 20.58* 15.97* 20.75*  CALCIUM 8.7* 8.5* 8.2* 8.1*  MG  --   --   --  2.0  PHOS  --  4.7*  --  3.7    GFR: Estimated Creatinine Clearance: 4.3 mL/min (A) (by C-G formula based on SCr of 20.75 mg/dL (H)). Liver Function Tests: Recent Labs  Lab 03/08/17 2145 03/09/17 0800 03/11/17 0542  AST 18  --  15  ALT 9*  --  10*  ALKPHOS 49  --  40  BILITOT 0.8  --  1.1  PROT 6.9  --  6.2*  ALBUMIN 3.4* 3.0* 2.8*   No results for input(s): LIPASE, AMYLASE in the last 168 hours. No results for input(s): AMMONIA in the last 168 hours. Coagulation Profile: No results for input(s): INR, PROTIME in the last 168 hours. Cardiac Enzymes: No results for input(s): CKTOTAL, CKMB, CKMBINDEX, TROPONINI in the last 168 hours. BNP (last 3 results) No results for input(s): PROBNP in the last 8760 hours. HbA1C: No results for input(s): HGBA1C in the last 72 hours. CBG: No results for input(s): GLUCAP in the last 168 hours. Lipid Profile: No results for input(s): CHOL, HDL, LDLCALC, TRIG, CHOLHDL, LDLDIRECT in the last 72 hours. Thyroid Function Tests: No results for input(s): TSH, T4TOTAL, FREET4, T3FREE, THYROIDAB in the last 72 hours. Anemia Panel: No results for input(s): VITAMINB12, FOLATE, FERRITIN, TIBC, IRON, RETICCTPCT in the last 72 hours. Sepsis Labs: Recent Labs  Lab 03/08/17 1817 03/08/17 2152 03/09/17 0800  PROCALCITON  --   --  17.38  LATICACIDVEN 1.06 2.0*  --     Recent Results (from the past 240 hour(s))  Blood culture (routine x 2)     Status: None (Preliminary result)   Collection Time: 03/08/17  6:03 PM  Result Value Ref Range Status   Specimen Description BLOOD LEFT ARM  Final   Special Requests IN PEDIATRIC BOTTLE Blood Culture adequate volume  Final   Culture   Final    NO GROWTH 2 DAYS Performed at [facility], [place] [address] [place], [place] [address]    Report Status PENDING  Incomplete  MRSA PCR Screening     Status: None   Collection Time: 03/09/17  6:16 AM  Result Value Ref Range Status   MRSA by PCR NEGATIVE NEGATIVE Final    Comment:        The  GeneXpert MRSA Assay (FDA approved for NASAL specimens only), is one component of a comprehensive MRSA colonization surveillance program. It is not intended to diagnose MRSA infection nor to guide or monitor treatment for MRSA infections. Performed at [facility], [place] [address], [place], [place] [address]   Culture, blood (single)     Status: None (Preliminary result)   Collection Time: 03/09/17 10:45 AM  Result Value Ref Range Status   Specimen Description BLOOD HEMODIALYSIS CATHETER  Final   Special Requests   Final    BOTTLES DRAWN AEROBIC AND ANAEROBIC Blood Culture results may not be optimal due to an excessive volume of blood received in culture bottles   Culture   Final    NO GROWTH 1 DAY Performed at [facility], [place],  [place] [address]    Report Status PENDING  Incomplete  Respiratory Panel by PCR     Status: None   Collection Time: 03/10/17  1:12 PM  Result Value Ref Range Status   Adenovirus NOT DETECTED NOT DETECTED Final   Coronavirus 229E NOT DETECTED NOT DETECTED Final   Coronavirus HKU1 NOT DETECTED NOT DETECTED Final   Coronavirus NL63 NOT DETECTED NOT DETECTED Final   Coronavirus OC43 NOT DETECTED NOT DETECTED Final   Metapneumovirus NOT DETECTED NOT DETECTED Final   Rhinovirus / Enterovirus NOT DETECTED NOT DETECTED Final   Influenza A NOT DETECTED NOT DETECTED Final   Influenza B NOT DETECTED NOT DETECTED Final   Parainfluenza Virus 1 NOT DETECTED NOT DETECTED Final   Parainfluenza Virus 2 NOT DETECTED NOT DETECTED Final   Parainfluenza Virus 3 NOT DETECTED NOT DETECTED Final   Parainfluenza Virus 4 NOT DETECTED NOT DETECTED Final   Respiratory Syncytial Virus NOT DETECTED NOT DETECTED Final   Bordetella pertussis NOT DETECTED NOT DETECTED Final   Chlamydophila pneumoniae NOT DETECTED NOT DETECTED Final   Mycoplasma pneumoniae NOT DETECTED NOT DETECTED Final    Comment: Performed at [facility], [place] [address], [place], [place] [address]         Radiology Studies: No results found.      Scheduled Meds: . albuterol  10 mg Nebulization Once  . cinacalcet  60 mg Oral Q M,W,F-HD  . cloNIDine  0.3 mg Transdermal Q Sat  . guaiFENesin  1,200 mg Oral BID  . pantoprazole  80 mg Oral Daily  . sevelamer carbonate  3,200 mg Oral TID WC  . tamsulosin  0.4 mg Oral QODAY   Continuous Infusions: . ceFEPime (MAXIPIME) IV Stopped (03/10/17 2307)  . vancomycin 1,000 mg (03/11/17 1135)     LOS: 2 days     Cordelia Poche, MD [facility] 03/11/2017, 4:29 PM Pager: [phone]  If 7PM-7AM, please contact night-coverage [email or web address] Password [email or web address] 03/11/2017, 4:29 PM

## 2017-03-12 ENCOUNTER — Inpatient Hospital Stay (HOSPITAL_COMMUNITY): Payer: Medicare Other

## 2017-03-12 DIAGNOSIS — A419 Sepsis, unspecified organism: Principal | ICD-10-CM

## 2017-03-12 DIAGNOSIS — Z992 Dependence on renal dialysis: Secondary | ICD-10-CM

## 2017-03-12 DIAGNOSIS — K7581 Nonalcoholic steatohepatitis (NASH): Secondary | ICD-10-CM

## 2017-03-12 DIAGNOSIS — N186 End stage renal disease: Secondary | ICD-10-CM

## 2017-03-12 DIAGNOSIS — J181 Lobar pneumonia, unspecified organism: Secondary | ICD-10-CM

## 2017-03-12 DIAGNOSIS — Z8249 Family history of ischemic heart disease and other diseases of the circulatory system: Secondary | ICD-10-CM

## 2017-03-12 DIAGNOSIS — I2510 Atherosclerotic heart disease of native coronary artery without angina pectoris: Secondary | ICD-10-CM

## 2017-03-12 DIAGNOSIS — J449 Chronic obstructive pulmonary disease, unspecified: Secondary | ICD-10-CM

## 2017-03-12 DIAGNOSIS — K047 Periapical abscess without sinus: Secondary | ICD-10-CM

## 2017-03-12 DIAGNOSIS — Z8619 Personal history of other infectious and parasitic diseases: Secondary | ICD-10-CM

## 2017-03-12 DIAGNOSIS — Z886 Allergy status to analgesic agent status: Secondary | ICD-10-CM

## 2017-03-12 DIAGNOSIS — I12 Hypertensive chronic kidney disease with 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: Secondary | ICD-10-CM

## 2017-03-12 DIAGNOSIS — J189 Pneumonia, unspecified organism: Secondary | ICD-10-CM

## 2017-03-12 DIAGNOSIS — K0889 Other specified disorders of teeth and supporting structures: Secondary | ICD-10-CM

## 2017-03-12 DIAGNOSIS — F17211 Nicotine dependence, cigarettes, in remission: Secondary | ICD-10-CM

## 2017-03-12 LAB — CBC
HCT: 25.4 % — ABNORMAL LOW (ref 39.0–52.0)
Hemoglobin: 8.3 g/dL — ABNORMAL LOW (ref 13.0–17.0)
MCH: 32.8 pg (ref 26.0–34.0)
MCHC: 32.7 g/dL (ref 30.0–36.0)
MCV: 100.4 fL — ABNORMAL HIGH (ref 78.0–100.0)
PLATELETS: 171 10*3/uL (ref 150–400)
RBC: 2.53 MIL/uL — AB (ref 4.22–5.81)
RDW: 15.3 % (ref 11.5–15.5)
WBC: 6.5 10*3/uL (ref 4.0–10.5)

## 2017-03-12 LAB — BASIC METABOLIC PANEL
Anion gap: 17 — ABNORMAL HIGH (ref 5–15)
BUN: 36 mg/dL — AB (ref 6–20)
CALCIUM: 7.9 mg/dL — AB (ref 8.9–10.3)
CO2: 25 mmol/L (ref 22–32)
CREATININE: 12.55 mg/dL — AB (ref 0.61–1.24)
Chloride: 95 mmol/L — ABNORMAL LOW (ref 101–111)
GFR calc Af Amer: 5 mL/min — ABNORMAL LOW (ref >60)
GFR, EST NON AFRICAN AMERICAN: 4 mL/min — AB (ref >60)
GLUCOSE: 93 mg/dL (ref 65–99)
POTASSIUM: 4.7 mmol/L (ref 3.5–5.1)
SODIUM: 137 mmol/L (ref 135–145)

## 2017-03-12 LAB — LEGIONELLA PNEUMOPHILA SEROGP 1 UR AG: L. pneumophila Serogp 1 Ur Ag: NEGATIVE

## 2017-03-12 LAB — EXPECTORATED SPUTUM ASSESSMENT W GRAM STAIN, RFLX TO RESP C

## 2017-03-12 LAB — PROCALCITONIN

## 2017-03-12 LAB — EXPECTORATED SPUTUM ASSESSMENT W REFEX TO RESP CULTURE

## 2017-03-12 MED ORDER — SEVELAMER CARBONATE 800 MG PO TABS
3200.0000 mg | ORAL_TABLET | Freq: Three times a day (TID) | ORAL | Status: DC
  Administered 2017-03-12 – 2017-03-17 (×11): 3200 mg via ORAL
  Filled 2017-03-12 (×13): qty 4

## 2017-03-12 MED ORDER — SODIUM CHLORIDE 0.9 % IV SOLN
3.0000 g | INTRAVENOUS | Status: DC
  Administered 2017-03-12 – 2017-03-16 (×5): 3 g via INTRAVENOUS
  Filled 2017-03-12 (×7): qty 3

## 2017-03-12 MED ORDER — DEXTROSE 5 % IV SOLN: 2.0000 g | INTRAVENOUS | Status: DC

## 2017-03-12 NOTE — Progress Notes (Signed)
Pharmacy Antibiotic Note  James Wall is a 51 y.o. male admitted on 03/08/2017 with pneumonia.  Pharmacy had been consulted for vancomycin and cefepime dosing. Consult changed to unasyn dosing.   Patient has ESRD and is on MWF HD, tolerated session on 03/11/17.  Tmax 102.3, WBC WNL, LA 2, PCT 17.  Last doses of cefepime was on 2/6 at 2328.  Plan: Change to unasyn 3 g every 24 hours  Monitor cx results, clinical picture  Height: 5' 5"  (165.1 cm) Weight: 202 lb 4.8 oz (91.8 kg)(a scale) IBW/kg (Calculated) : 61.5  Temp (24hrs), Avg:101.6 F (38.7 C), Min:99.5 F (37.5 C), Max:102.8 F (39.3 C)  Recent Labs  Lab 03/08/17 1803 03/08/17 1817 03/08/17 2145 03/08/17 2152 03/09/17 0800 03/09/17 1317 03/10/17 0450 03/11/17 0542 03/12/17 0452  WBC 9.2  --   --   --  11.2*  --  9.7 8.6 6.5  CREATININE  --   --  20.39*  --  20.58* 15.97*  --  20.75* 12.55*  LATICACIDVEN  --  1.06  --  2.0*  --   --   --   --   --     Estimated Creatinine Clearance: 7.2 mL/min (A) (by C-G formula based on SCr of 12.55 mg/dL (H)).    Allergies  Allergen Reactions  . Aspirin Other (See Comments)    Made congestion worse    Cefepime 2/3 >>2/7 Vanc 2/3 >>2/7 Unasyn 2/7 >>  2/4 BCx- NGTD  2/3 BCx - NGTD 2/4 MRSA PCR - negative 2/5 resp panel PCR - negative   Doylene Canard, PharmD Clinical Pharmacist  Pager: [phone] Clinical Phone for 03/12/2017 until 3:30pm: [phone] If after 3:30pm, please call main pharmacy at [phone] 03/12/2017, 3:31 PM

## 2017-03-12 NOTE — Progress Notes (Addendum)
Pharmacy Antibiotic Note  James Wall is a 52 y.o. male admitted on 03/08/2017 with pneumonia.  Pharmacy has been consulted for vancomycin and cefepime dosing.  Patient has ESRD and is on MWF HD, tolerated session on 03/11/17.  Tmax 102.3, WBC WNL, LA 2, PCT 17.  Plan: Continue vanc 1gm IV qHD MWF Continue cefepime 1gm IV Q24H for one more day, then change to 2gm IV qHD MWF Monitor HD schedule/tolerance, clinical progress, vanc pre-HD level as indicated If not line infection, would patient benefit from atypical coverage?   Height: 5' 5"  (165.1 cm) Weight: 202 lb 4.8 oz (91.8 kg)(a scale) IBW/kg (Calculated) : 61.5  Temp (24hrs), Avg:100.8 F (38.2 C), Min:98.5 F (36.9 C), Max:102.3 F (39.1 C)  Recent Labs  Lab 03/08/17 1803 03/08/17 1817 03/08/17 2145 03/08/17 2152 03/09/17 0800 03/09/17 1317 03/10/17 0450 03/11/17 0542 03/12/17 0452  WBC 9.2  --   --   --  11.2*  --  9.7 8.6 6.5  CREATININE  --   --  20.39*  --  20.58* 15.97*  --  20.75* 12.55*  LATICACIDVEN  --  1.06  --  2.0*  --   --   --   --   --     Estimated Creatinine Clearance: 7.2 mL/min (A) (by C-G formula based on SCr of 12.55 mg/dL (H)).    Allergies  Allergen Reactions  . Aspirin Other (See Comments)    Made congestion worse    Cefepime 2/3 >> Vanc 2/3 >>  2/3 BCx - NGTD 2/4 MRSA PCR - negative 2/5 resp panel PCR - negative   Galadriel Shroff D. Mina Marble, PharmD, BCPS Pager:  [phone] 03/12/2017, 7:32 AM

## 2017-03-12 NOTE — Progress Notes (Signed)
[facility] Progress Note  Subjective: had spiking temps again last night  Vitals:   03/12/17 0200 03/12/17 0631 03/12/17 0734 03/12/17 1009  BP: 126/73 (!) 169/91 (!) 160/82   Pulse:  70 69   Resp:      Temp:  (!) 101.5 F (38.6 C)  99.5 F (37.5 C)  TempSrc:  Oral  Oral  SpO2:  98% 96%   Weight:  91.8 kg (202 lb 4.8 oz)    Height:        Inpatient medications: . albuterol  10 mg Nebulization Once  . cinacalcet  60 mg Oral Q M,W,F-HD  . cloNIDine  0.3 mg Transdermal Q Sat  . guaiFENesin  1,200 mg Oral BID  . pantoprazole  80 mg Oral Daily  . sevelamer carbonate  3,200 mg Oral TID WC  . tamsulosin  0.4 mg Oral QODAY   . ceFEPime (MAXIPIME) IV Stopped (03/12/17 0030)  . [START ON 03/13/2017] ceFEPime (MAXIPIME) IV    . vancomycin 1,000 mg (03/11/17 1135)   acetaminophen, diphenhydrAMINE **AND** [DISCONTINUED] acetaminophen, gi cocktail, hydrALAZINE, HYDROmorphone (DILAUDID) injection, hydrOXYzine, ipratropium-albuterol, ondansetron **OR** ondansetron (ZOFRAN) IV, oxyCODONE  Exam: Alert, no distress NO jvd Chest clear bilat RRR Abd soft ntnd Ext no wounds or edema NF, ox3  Dialysis: [facility]WF [facility] 3.5h   90kg   2/2 bath   TDC (exchanged 1/31 CKV low BFR)  P4   Hep 4000 + 1K mid VDRA: C3 3.46mg qTx PARSABIV: 557mqTx EPO: mircera 60 q2wk, last given 1/30 Home BP: clonidine patch 0.3 weekly       Impression: 1  Fevers/ suspected basilar PNA - still spiking temps 2  ESRD HD mwf 3  Hyperkalemia, resolved 4  Abd pain/ N/V, improved 5  Chron TDC 6  HTN BP's better 7  Anemia ckd on ESA 8  MBD ckd, cont meds 9  Vol stable   Plan - HD Friday    RoKelly SplinterD [facility] pager [phone] 03/12/2017, 12:50 PM   Recent Labs  Lab 03/09/17 0800 03/09/17 1317 03/11/17 0542 03/12/17 0452  NA 134* 133* 135 137  K 5.8* 4.9 5.6* 4.7  CL 92* 94* 93* 95*  CO2 18* 19* 20* 25  GLUCOSE 78 80 87 93  BUN 74* 54* 75* 36*  CREATININE  20.58* 15.97* 20.75* 12.55*  CALCIUM 8.5* 8.2* 8.1* 7.9*  PHOS 4.7*  --  3.7  --    Recent Labs  Lab 03/08/17 2145 03/09/17 0800 03/11/17 0542  AST 18  --  15  ALT 9*  --  10*  ALKPHOS 49  --  40  BILITOT 0.8  --  1.1  PROT 6.9  --  6.2*  ALBUMIN 3.4* 3.0* 2.8*   Recent Labs  Lab 03/08/17 1803 03/09/17 0800 03/10/17 0450 03/11/17 0542 03/12/17 0452  WBC 9.2 11.2* 9.7 8.6 6.5  NEUTROABS 7.3 9.4*  --  5.7  --   HGB 10.4* 9.8* 8.9* 8.9* 8.3*  HCT 31.4* 29.8* 26.7* 27.0* 25.4*  MCV 100.6* 101.4* 101.5* 99.6 100.4*  PLT 219 210 180 187 171   Iron/TIBC/Ferritin/ %Sat    Component Value Date/Time   IRON 58 07/10/2013 1500   TIBC 164 (L) 07/10/2013 1500   FERRITIN 1,060 (H) 07/10/2013 1500   IRONPCTSAT 35 07/10/2013 1500

## 2017-03-12 NOTE — Progress Notes (Signed)
PROGRESS NOTE    James Wall  CZY:[id number] DOB: [birth year] DOA: 03/08/2017 PCP: Leeroy Cha, MD   Brief Narrative: James Wall is a 52 y.o. male with PMH of ESRD on MWF HD, HTN, CAD, COPD, chronic intermittent nausea and vomiting, cirrhosis, presented to ED with 1-2 days history of fevers, chills, nausea, vomiting, left sided upper abdominal pain/chest pain worse with coughing. Some reported blood in stool. CT abdomen and pelvis showed findings suggestive of pneumonia, no acute abdominal findings. Admitted for sepsis, possibly due to pneumonia but less likely  HD access related infection given recent catheter change. Nephrology consulted and patient getting dialyzed tomorrow.     Assessment & Plan:   Principal Problem:   Sepsis due to pneumonia [facility]) Active Problems:   GERD (gastroesophageal reflux disease)   Anemia of renal disease   ESRD on dialysis ([place])   Hyperkalemia   Hypertensive urgency   Sepsis Secondary to HCAP. Blood culture/HD cath no growth to date.  HCAP Patient still spiking high fevers. WBC wnl. CT chest significant for Right upper lobe consolidation in addition to bilateral basilar infiltrates. -Continue Cefepime and vancomycin -ID consult. Considering bronchoscopy -HIV pending -Legionella pending  Hyperkalemia Manage with HD per nephrology  ESRD on MWF HD -Nephrology follow-up  Hypertensive urgency Still elevated. -Continue clonidine -Nephrology recommendations  Chest/abdominal pain Thought secondary to pneumonia. Improved today.  Nausea and vomiting Acute on chronic. Tolerating oral intake. Resolved.  Anemia of chronic disease Secondary to CKD. Hemoglobin stable. History of ?rectal bleeding. Stable.  GERD -Continue PPI  Hemorrhagic/nonhemorrhagic renal cysts Seen on CT scan incidentally.   DVT prophylaxis: SCDs Code Status: Full code Family Communication: Uncle at bedside Disposition Plan: Discharge  when medically stable   Consultants:   Nephrology  Procedures:   Hemodialysis  Antimicrobials:  Vancomycin (2/4>>  Cefepime (2/4>>   Subjective: Abdominal pain improved today. Producing sputum overnight. Febrile overnight.  Objective: Vitals:   03/12/17 0200 03/12/17 0631 03/12/17 0734 03/12/17 1009  BP: 126/73 (!) 169/91 (!) 160/82   Pulse:  70 69   Resp:      Temp:  (!) 101.5 F (38.6 C)  99.5 F (37.5 C)  TempSrc:  Oral  Oral  SpO2:  98% 96%   Weight:  91.8 kg (202 lb 4.8 oz)    Height:        Intake/Output Summary (Last 24 hours) at 03/12/2017 1123 Last data filed at 03/12/2017 0631 Gross per 24 hour  Intake 630 ml  Output 2656 ml  Net -2026 ml   Filed Weights   03/11/17 0850 03/11/17 1213 03/12/17 0631  Weight: 94.2 kg (207 lb 10.8 oz) 91.6 kg (201 lb 15.1 oz) 91.8 kg (202 lb 4.8 oz)    Examination:  General exam: Appears calm and comfortable Respiratory system: Clear and diminished. Respiratory effort normal. Cardiovascular system: Regular rate and rhythm. Normal S1 and S2. No heart murmurs present. No extra heart sounds Gastrointestinal system: Abdomen is nondistended, soft and nontender. Normal bowel sounds heard. Central nervous system: Alert and oriented. No focal neurological deficits. Extremities: No edema. No calf tenderness Skin: No cyanosis. No rashes Psychiatry: Judgement and insight appear normal. Mood & affect appropriate.     Data Reviewed: I have personally reviewed following labs and imaging studies  CBC: Recent Labs  Lab 03/08/17 1803 03/09/17 0800 03/10/17 0450 03/11/17 0542 03/12/17 0452  WBC 9.2 11.2* 9.7 8.6 6.5  NEUTROABS 7.3 9.4*  --  5.7  --   HGB 10.4* 9.8*  8.9* 8.9* 8.3*  HCT 31.4* 29.8* 26.7* 27.0* 25.4*  MCV 100.6* 101.4* 101.5* 99.6 100.4*  PLT 219 210 180 187 144   Basic Metabolic Panel: Recent Labs  Lab 03/08/17 2145 03/09/17 0800 03/09/17 1317 03/11/17 0542 03/12/17 0452  NA 134* 134* 133* 135 137    K 6.7* 5.8* 4.9 5.6* 4.7  CL 92* 92* 94* 93* 95*  CO2 20* 18* 19* 20* 25  GLUCOSE 79 78 80 87 93  BUN 68* 74* 54* 75* 36*  CREATININE 20.39* 20.58* 15.97* 20.75* 12.55*  CALCIUM 8.7* 8.5* 8.2* 8.1* 7.9*  MG  --   --   --  2.0  --   PHOS  --  4.7*  --  3.7  --    GFR: Estimated Creatinine Clearance: 7.2 mL/min (A) (by C-G formula based on SCr of 12.55 mg/dL (H)). Liver Function Tests: Recent Labs  Lab 03/08/17 2145 03/09/17 0800 03/11/17 0542  AST 18  --  15  ALT 9*  --  10*  ALKPHOS 49  --  40  BILITOT 0.8  --  1.1  PROT 6.9  --  6.2*  ALBUMIN 3.4* 3.0* 2.8*   No results for input(s): LIPASE, AMYLASE in the last 168 hours. No results for input(s): AMMONIA in the last 168 hours. Coagulation Profile: No results for input(s): INR, PROTIME in the last 168 hours. Cardiac Enzymes: No results for input(s): CKTOTAL, CKMB, CKMBINDEX, TROPONINI in the last 168 hours. BNP (last 3 results) No results for input(s): PROBNP in the last 8760 hours. HbA1C: No results for input(s): HGBA1C in the last 72 hours. CBG: No results for input(s): GLUCAP in the last 168 hours. Lipid Profile: No results for input(s): CHOL, HDL, LDLCALC, TRIG, CHOLHDL, LDLDIRECT in the last 72 hours. Thyroid Function Tests: No results for input(s): TSH, T4TOTAL, FREET4, T3FREE, THYROIDAB in the last 72 hours. Anemia Panel: No results for input(s): VITAMINB12, FOLATE, FERRITIN, TIBC, IRON, RETICCTPCT in the last 72 hours. Sepsis Labs: Recent Labs  Lab 03/08/17 1817 03/08/17 2152 03/09/17 0800 03/12/17 0735  PROCALCITON  --   --  17.38 >150.00  LATICACIDVEN 1.06 2.0*  --   --     Recent Results (from the past 240 hour(s))  Blood culture (routine x 2)     Status: None (Preliminary result)   Collection Time: 03/08/17  6:03 PM  Result Value Ref Range Status   Specimen Description BLOOD LEFT ARM  Final   Special Requests IN PEDIATRIC BOTTLE Blood Culture adequate volume  Final   Culture   Final    NO  GROWTH 3 DAYS Performed at [facility], [place] [address] [place], [place] [address]    Report Status PENDING  Incomplete  MRSA PCR Screening     Status: None   Collection Time: 03/09/17  6:16 AM  Result Value Ref Range Status   MRSA by PCR NEGATIVE NEGATIVE Final    Comment:        The GeneXpert MRSA Assay (FDA approved for NASAL specimens only), is one component of a comprehensive MRSA colonization surveillance program. It is not intended to diagnose MRSA infection nor to guide or monitor treatment for MRSA infections. Performed at [facility], [place] [address], [place], [place] [address]   Culture, blood (single)     Status: None (Preliminary result)   Collection Time: 03/09/17 10:45 AM  Result Value Ref Range Status   Specimen Description BLOOD HEMODIALYSIS CATHETER  Final   Special Requests  Final    BOTTLES DRAWN AEROBIC AND ANAEROBIC Blood Culture results may not be optimal due to an excessive volume of blood received in culture bottles   Culture   Final    NO GROWTH 2 DAYS Performed at [facility] [address], [place], [place] [address]    Report Status PENDING  Incomplete  Respiratory Panel by PCR     Status: None   Collection Time: 03/10/17  1:12 PM  Result Value Ref Range Status   Adenovirus NOT DETECTED NOT DETECTED Final   Coronavirus 229E NOT DETECTED NOT DETECTED Final   Coronavirus HKU1 NOT DETECTED NOT DETECTED Final   Coronavirus NL63 NOT DETECTED NOT DETECTED Final   Coronavirus OC43 NOT DETECTED NOT DETECTED Final   Metapneumovirus NOT DETECTED NOT DETECTED Final   Rhinovirus / Enterovirus NOT DETECTED NOT DETECTED Final   Influenza A NOT DETECTED NOT DETECTED Final   Influenza B NOT DETECTED NOT DETECTED Final   Parainfluenza Virus 1 NOT DETECTED NOT DETECTED Final   Parainfluenza Virus 2 NOT DETECTED NOT DETECTED Final   Parainfluenza Virus 3 NOT DETECTED NOT DETECTED Final   Parainfluenza Virus 4 NOT DETECTED NOT DETECTED  Final   Respiratory Syncytial Virus NOT DETECTED NOT DETECTED Final   Bordetella pertussis NOT DETECTED NOT DETECTED Final   Chlamydophila pneumoniae NOT DETECTED NOT DETECTED Final   Mycoplasma pneumoniae NOT DETECTED NOT DETECTED Final    Comment: Performed at [facility], [place] [address] [place], [place] [address]         Radiology Studies: Ct Chest W Contrast  Result Date: 03/11/2017 CLINICAL DATA:  Pneumonia, pain with breathing, history of end-stage renal disease on dialysis, COPD, cirrhosis, hypertension EXAM: CT CHEST WITH CONTRAST TECHNIQUE: Multidetector CT imaging of the chest was performed during intravenous contrast administration. CONTRAST:  75 mL ISOVUE-300 IOPAMIDOL (ISOVUE-300) INJECTION 61% IV COMPARISON:  Chest radiograph 03/08/2017; prior CT chest 07/12/2013 FINDINGS: Cardiovascular: Atherosclerotic calcifications aorta, coronary arteries and proximal great vessels. Aorta normal caliber. Minimal pericardial fluid. Heart otherwise unremarkable. RIGHT jugular central venous catheter with tip at superior RIGHT atrium. Mediastinum/Nodes: Esophagus normal appearance. Scattered normal size mediastinal lymph nodes. No thoracic adenopathy. Base of cervical region normal appearance. Lungs/Pleura: Patchy airspace consolidation in the posterior segment of the RIGHT upper lobe and in BILATERAL lower lobes RIGHT greater than LEFT. Bleb at lateral RIGHT lung base. Minimal atelectasis in posterior LEFT upper lobe adjacent to major fissure. Very minimal LEFT pleural effusion. No pneumothorax. Upper Abdomen: Contracted gallbladder. Atrophic upper pole LEFT kidney with small cysts consistent with history of end-stage renal disease. Nodular appearing liver consistent with history of cirrhosis. Remaining visualized upper abdomen otherwise unremarkable. 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 IMPRESSION: Patchy airspace infiltrates in RIGHT upper lobe and  to lesser degrees in RIGHT greater than LEFT lower lobes favoring pneumonia. Extensive atherosclerotic disease changes including coronary arteries. Aortic Atherosclerosis (ICD10-I70.0). Electronically Signed   By: Lavonia Dana M.D.   On: 03/11/2017 20:14   Dg Chest Port 1 View  Result Date: 03/12/2017 CLINICAL DATA:  Short of breath EXAM: PORTABLE CHEST 1 VIEW COMPARISON:  03/11/2017, 03/08/2017 FINDINGS: Dialysis access catheter at the cavoatrial junction unchanged. Bibasilar airspace disease is more prominent and could represent edema or pneumonia. Mild vascular congestion. No effusion. Cardiac enlargement IMPRESSION: Mild progression of bibasilar airspace disease. Probable edema versus pneumonia. Electronically Signed   By: Franchot Gallo M.D.   On: 03/12/2017 09:27  Scheduled Meds: . albuterol  10 mg Nebulization Once  . cinacalcet  60 mg Oral Q M,W,F-HD  . cloNIDine  0.3 mg Transdermal Q Sat  . guaiFENesin  1,200 mg Oral BID  . pantoprazole  80 mg Oral Daily  . sevelamer carbonate  3,200 mg Oral TID WC  . tamsulosin  0.4 mg Oral QODAY   Continuous Infusions: . ceFEPime (MAXIPIME) IV Stopped (03/12/17 0030)  . [START ON 03/13/2017] ceFEPime (MAXIPIME) IV    . vancomycin 1,000 mg (03/11/17 1135)     LOS: 3 days     Cordelia Poche, MD [facility] 03/12/2017, 11:23 AM Pager: [phone]  If 7PM-7AM, please contact night-coverage [email or web address] Password [facility] 03/12/2017, 11:23 AM

## 2017-03-12 NOTE — Consult Note (Signed)
Date of Admission:  03/08/2017          Reason for Consult: Persistent fever with pneumonia   Referring Provider: Dr. 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 Imaging is not consistent with MRSA pneumonia and no other exposures to indicate other source of atypical infection. Respiratory panel is negative; legionella and strep pneumo antigens were negative. He is HIV negative. He does have poor dentition and dental pain concerning for odontogenic infection possibly associated with his pneumonia which is currently not covered by his Abx regimen.   Plan: 1. Stop vanc/cefepime 2. Start unasyn - appreciate pharmacy dosing 3. F/u Orthopantogram to evaluate for dental abscess   Principal Problem:   Sepsis due to pneumonia [facility]) Active Problems:   GERD (gastroesophageal reflux disease)   Anemia of renal disease   ESRD on dialysis (HCC)   Hyperkalemia   Hypertensive urgency   Scheduled Meds: . albuterol  10 mg Nebulization Once  . cinacalcet  60 mg Oral Q M,W,F-HD  . cloNIDine  0.3 mg Transdermal Q Sat  . guaiFENesin  1,200 mg Oral BID  . pantoprazole  80 mg Oral Daily  . sevelamer carbonate  3,200 mg Oral TID WC  . tamsulosin  0.4 mg Oral QODAY   Continuous Infusions:  PRN Meds:.acetaminophen, diphenhydrAMINE **AND** [DISCONTINUED] acetaminophen, gi cocktail, hydrALAZINE, HYDROmorphone (DILAUDID) injection, hydrOXYzine, ipratropium-albuterol, ondansetron **OR** ondansetron (ZOFRAN) IV, oxyCODONE  HPI: James Wall is a 52 y.o. male wit h/o ESRD on HD, cirrhosis, COPD, presenting to [facility] on 2[facility]3 with chills after recent HD cath exchange; he was found to be febrile. CT abd/pelvis obtained in ED due to abd pain and nausea after kayexalate administration for hyperkalemia  demonstrated bil lower lobe infiltrates concerning for pneumonia; he had no other source of infection. He was started on Vanc/Cefepime. Since then he has started having a productive cough (one episode of hemoptysis) with white/yellow sputum. He reports right sided tooth pain that was worse when he was actively smoking (quit 1 wk ago); his nephrologist prescribed 7 day abx course. Currently he still has intermittent dental pain, however less intense.   He endorses 50yrsmoking hx, quit 1 wk ago; endorses occasional EtOH use and marijuana use (denies IVDU or inhaled drug use past or current). He has not traveled out of the country. He denies association with birds, farm animals.   Review of Systems: Review of Systems  Constitutional: Positive for chills. Negative for diaphoresis, fever, malaise/fatigue and weight loss.  HENT: Negative for congestion and sore throat.   Respiratory: Positive for cough, hemoptysis, sputum production and shortness of breath (on exertion).   Cardiovascular: Positive for chest pain (left sided on admission). Negative for leg swelling.  Gastrointestinal: Positive for abdominal pain (on admission, resolved). Negative for constipation, diarrhea, nausea and vomiting.  Genitourinary: Negative for dysuria, flank pain and hematuria.  Musculoskeletal: Negative for joint pain.  Skin: Negative for rash.  Neurological: Negative for headaches.  Psychiatric/Behavioral: Negative for substance abuse.    Past Medical History:  Diagnosis Date  . Anemia   . Anxiety   . Arthritis    GOUT - pt not sure if this is true  . AV fistula [place] ([place] 10[place]2015  . Cirrhosis, nonalcoholic ([place]   . COPD (chronic obstructive pulmonary disease) (  [place])   . Depression   . ESRD on hemodialysis [facility])    Started HD Jan 2009.  ESRD was due to HTN.  Dx'd with HTN in hospital 1996 according to pt, they had to keep him so he could get Medicaid to afford the BP medications.  First saw a nephrologist  and started HD in the same year 2009.  Gets HD at [facility] on a MWF schedule.  Does not have DM. He had a left RC AVF that never functioned, a left upper arm AVF that worked for about 5 years and as of June  . GERD (gastroesophageal reflux disease)   . Hypertension   . Pneumonia 03/2017  . Renal insufficiency   . Secondary hyperparathyroidism ([place])   . Sepsis ([place]) 02/2013   from AVF , treated with Vancomycin.  Marland Kitchen Shortness of breath    With exertion  . Sleep apnea    no  longer using cpap    Social History   Tobacco Use  . Smoking status: Former Smoker    Packs/day: 0.50    Years: 16.00    Pack years: 8.00    Types: Cigarettes    Last attempt to quit: 03/03/2017    Years since quitting: 0.0  . Smokeless tobacco: Never Used  Substance Use Topics  . Alcohol use: Yes    Alcohol/week: 0.0 oz    Comment: occassional  . Drug use: No    Family History  Problem Relation Age of Onset  . Hypertension Mother   . Cerebrovascular Accident Father   . Hypertension Father   . Congestive Heart Failure Brother   . Asthma Brother    Allergies  Allergen Reactions  . Aspirin Other (See Comments)    Made congestion worse    OBJECTIVE: Blood pressure (!) 168/86, pulse 89, temperature (!) 102 F (38.9 C), temperature source Oral, resp. rate (!) 22, height 5' 5"  (1.651 m), weight 202 lb 4.8 oz (91.8 kg), SpO2 97 %.  Physical Exam  Constitutional: NAD, lying in bed comfortably HEENT: mild bil submandibular lymphadenopathy w/o tenderness; poor dentition with cracked teeth, carries w/o obvious dental abscess.  CV: RRR, no M/R/G, pulses intact Resp: CTAB, no increased work of breathing, speaking in full sentences Abd: obese, soft, nontender Ext: moves all 4 freely Skin: no rash, abscess Neuro: A&Ox3, CN grossly intact Psych: tangential conversation; not depressed or anxious appearing  Lab Results Lab Results  Component Value Date   WBC 6.5 03/12/2017   HGB 8.3 (L) 03/12/2017   HCT  25.4 (L) 03/12/2017   MCV 100.4 (H) 03/12/2017   PLT 171 03/12/2017    Lab Results  Component Value Date   CREATININE 12.55 (H) 03/12/2017   BUN 36 (H) 03/12/2017   NA 137 03/12/2017   K 4.7 03/12/2017   CL 95 (L) 03/12/2017   CO2 25 03/12/2017    Lab Results  Component Value Date   ALT 10 (L) 03/11/2017   AST 15 03/11/2017   ALKPHOS 40 03/11/2017   BILITOT 1.1 03/11/2017     Microbiology: Recent Results (from the past 240 hour(s))  Blood culture (routine x 2)     Status: None (Preliminary result)   Collection Time: 03/08/17  6:03 PM  Result Value Ref Range Status   Specimen Description BLOOD LEFT ARM  Final   Special Requests IN PEDIATRIC BOTTLE Blood Culture adequate volume  Final   Culture   Final    NO GROWTH 3 DAYS Performed at [facility], [place] [address], [place], [place] [address]    Report Status PENDING  Incomplete  MRSA PCR Screening     Status: None   Collection Time: 03/09/17  6:16 AM  Result Value Ref Range Status   MRSA by PCR NEGATIVE NEGATIVE Final    Comment:        The GeneXpert MRSA Assay (FDA approved for NASAL specimens only), is one component of a comprehensive MRSA colonization surveillance program. It is not intended to diagnose MRSA infection nor to guide or monitor treatment for MRSA infections. Performed at [facility], [place] [address], [place], [place] [address]   Culture, blood (single)     Status: None (Preliminary result)   Collection Time: 03/09/17 10:45 AM  Result Value Ref Range Status   Specimen Description BLOOD HEMODIALYSIS CATHETER  Final   Special Requests   Final    BOTTLES DRAWN AEROBIC AND ANAEROBIC Blood Culture results may not be optimal due to an excessive volume of blood received in culture bottles   Culture   Final    NO GROWTH 2 DAYS Performed at [place] [address], [place], [place] [address]    Report Status PENDING  Incomplete  Respiratory Panel by PCR     Status: None    Collection Time: 03/10/17  1:12 PM  Result Value Ref Range Status   Adenovirus NOT DETECTED NOT DETECTED Final   Coronavirus 229E NOT DETECTED NOT DETECTED Final   Coronavirus HKU1 NOT DETECTED NOT DETECTED Final   Coronavirus NL63 NOT DETECTED NOT DETECTED Final   Coronavirus OC43 NOT DETECTED NOT DETECTED Final   Metapneumovirus NOT DETECTED NOT DETECTED Final   Rhinovirus / Enterovirus NOT DETECTED NOT DETECTED Final   Influenza A NOT DETECTED NOT DETECTED Final   Influenza B NOT DETECTED NOT DETECTED Final   Parainfluenza Virus 1 NOT DETECTED NOT DETECTED Final   Parainfluenza Virus 2 NOT DETECTED NOT DETECTED Final   Parainfluenza Virus 3 NOT DETECTED NOT DETECTED Final   Parainfluenza Virus 4 NOT DETECTED NOT DETECTED Final   Respiratory Syncytial Virus NOT DETECTED NOT DETECTED Final   Bordetella pertussis NOT DETECTED NOT DETECTED Final   Chlamydophila pneumoniae NOT DETECTED NOT DETECTED Final   Mycoplasma pneumoniae NOT DETECTED NOT DETECTED Final    Comment: Performed at [facility], [place] [address], [place], [place] [address]    Alphonzo Grieve, MD PGY - [place] for [place] [phone] pager  03/12/2017, 3:19 PM

## 2017-03-12 NOTE — Progress Notes (Signed)
Informed patient about HIV lab test and refusing to be stuck again. Will test for it tomorrow during dialysis. MD aware. Patient resting comfortably. Modena Morrow E, RN 03/12/2017 11:46 AM

## 2017-03-13 ENCOUNTER — Inpatient Hospital Stay (HOSPITAL_COMMUNITY): Payer: Medicare Other

## 2017-03-13 DIAGNOSIS — R1110 Vomiting, unspecified: Secondary | ICD-10-CM

## 2017-03-13 DIAGNOSIS — E669 Obesity, unspecified: Secondary | ICD-10-CM

## 2017-03-13 DIAGNOSIS — R509 Fever, unspecified: Secondary | ICD-10-CM | POA: Diagnosis present

## 2017-03-13 DIAGNOSIS — R1032 Left lower quadrant pain: Secondary | ICD-10-CM

## 2017-03-13 DIAGNOSIS — K7460 Unspecified cirrhosis of liver: Secondary | ICD-10-CM

## 2017-03-13 DIAGNOSIS — R05 Cough: Secondary | ICD-10-CM

## 2017-03-13 DIAGNOSIS — R109 Unspecified abdominal pain: Secondary | ICD-10-CM

## 2017-03-13 LAB — CULTURE, BLOOD (ROUTINE X 2)
Culture: NO GROWTH
SPECIAL REQUESTS: ADEQUATE

## 2017-03-13 LAB — CBC
HCT: 23.3 % — ABNORMAL LOW (ref 39.0–52.0)
Hemoglobin: 7.6 g/dL — ABNORMAL LOW (ref 13.0–17.0)
MCH: 32.5 pg (ref 26.0–34.0)
MCHC: 32.6 g/dL (ref 30.0–36.0)
MCV: 99.6 fL (ref 78.0–100.0)
Platelets: 185 10*3/uL (ref 150–400)
RBC: 2.34 MIL/uL — AB (ref 4.22–5.81)
RDW: 16 % — AB (ref 11.5–15.5)
WBC: 6.1 10*3/uL (ref 4.0–10.5)

## 2017-03-13 LAB — RENAL FUNCTION PANEL
ALBUMIN: 2.5 g/dL — AB (ref 3.5–5.0)
Anion gap: 18 — ABNORMAL HIGH (ref 5–15)
BUN: 46 mg/dL — AB (ref 6–20)
CALCIUM: 8.1 mg/dL — AB (ref 8.9–10.3)
CHLORIDE: 95 mmol/L — AB (ref 101–111)
CO2: 23 mmol/L (ref 22–32)
CREATININE: 15.05 mg/dL — AB (ref 0.61–1.24)
GFR, EST AFRICAN AMERICAN: 4 mL/min — AB (ref >60)
GFR, EST NON AFRICAN AMERICAN: 3 mL/min — AB (ref >60)
Glucose, Bld: 101 mg/dL — ABNORMAL HIGH (ref 65–99)
Phosphorus: 4 mg/dL (ref 2.5–4.6)
Potassium: 4.5 mmol/L (ref 3.5–5.1)
SODIUM: 136 mmol/L (ref 135–145)

## 2017-03-13 LAB — PROCALCITONIN

## 2017-03-13 MED ORDER — HYDROMORPHONE HCL 1 MG/ML IJ SOLN
INTRAMUSCULAR | Status: AC
  Administered 2017-03-13: 0.5 mg via INTRAVENOUS
  Filled 2017-03-13: qty 0.5

## 2017-03-13 NOTE — Progress Notes (Signed)
Pharmacy contacted and Vancomycin IV requested since patient did not receive vancomycin in dialysis (reported that patient declined last hour of dialysis, so dialysis RN unable to administer). Also requested from pharmacy that they enter a dose into Epic, so I can scan medication in.

## 2017-03-13 NOTE — Progress Notes (Addendum)
PROGRESS NOTE    James Wall  IOX:[id number] DOB: March 19, 1965 DOA: 03/08/2017 PCP: Leeroy Cha, MD   Brief Narrative: James Wall is a 52 y.o. male with PMH of ESRD on MWF HD, HTN, CAD, COPD, chronic intermittent nausea and vomiting, cirrhosis, presented to ED with 1-2 days history of fevers, chills, nausea, vomiting, left sided upper abdominal pain/chest pain worse with coughing. Some reported blood in stool. CT abdomen and pelvis showed findings suggestive of pneumonia, no acute abdominal findings. Admitted for sepsis, possibly due to pneumonia but less likely  HD access related infection given recent catheter change. Nephrology consulted and patient getting dialyzed. Still with fevers on vancomycin and cefepime. ID consulted and transitioned to Unasyn.   Assessment & Plan:   Principal Problem:   Sepsis due to pneumonia [facility]) Active Problems:   GERD (gastroesophageal reflux disease)   Anemia of renal disease   ESRD on dialysis ([place])   Hyperkalemia   Hypertensive urgency   Tooth abscess   Lobar pneumonia, unspecified organism ([place])   Sepsis Secondary to HCAP. Blood culture/HD cath no growth to date.  HCAP Lobar pneumonia Patient still spiking high fevers. WBC wnl. CT chest significant for Right upper lobe consolidation in addition to bilateral basilar infiltrates. Legionella negative. -ID consulted. Transitioned to Unasyn -HIV pending  Fevers Likely secondary to pneumonia, but persisting. No other sources of infection. History of ascites which was not seen on CT abdomen -abdominal ultrasound  Hyperkalemia Manage with HD per nephrology  ESRD on MWF HD -Nephrology follow-up  Hypertensive urgency Better controlled. -Continue clonidine -Nephrology recommendations  Chest/abdominal pain Likely secondary to pneumonia. CT unremarkable for abdominal etiology. No evidence of pancreatitis. Returned today -Continue analgesics  Nausea and  vomiting Acute on chronic. Tolerating oral intake. Resolved.  Anemia of chronic disease Secondary to CKD. Hemoglobin dropped. Possible factor of blood loss anemia. External hemorrhoid seen on exam. -Will consult GI in the morning  GERD -Continue PPI  Hemorrhagic/nonhemorrhagic renal cysts Seen on CT scan incidentally.  Non-alcoholic cirrhosis Stable.   DVT prophylaxis: SCDs Code Status: Full code Family Communication: None at bedside Disposition Plan: Discharge when medically stable   Consultants:   Nephrology  Procedures:   Hemodialysis  Antimicrobials:  Vancomycin (2/4>>2/7)  Cefepime (2/4>>2/7)   Subjective: LUQ pain returned this morning. Cough is non-productive. Febrile.  Objective: Vitals:   03/13/17 1015 03/13/17 1040 03/13/17 1200 03/13/17 1524  BP: (!) 158/83 (!) 162/87 (!) 166/92 137/81  Pulse: 74 85 92 85  Resp: 20 19 20 17   Temp:  99.1 F (37.3 C) (!) 101.6 F (38.7 C) 99.4 F (37.4 C)  TempSrc:  Oral Oral Oral  SpO2:  99% 91% 95%  Weight:  90.2 kg (198 lb 13.7 oz)    Height:        Intake/Output Summary (Last 24 hours) at 03/13/2017 1739 Last data filed at 03/13/2017 1320 Gross per 24 hour  Intake 400 ml  Output 2578 ml  Net -2178 ml   Filed Weights   03/13/17 0600 03/13/17 0730 03/13/17 1040  Weight: 92.7 kg (204 lb 4.8 oz) 93.4 kg (205 lb 14.6 oz) 90.2 kg (198 lb 13.7 oz)    Examination:  General exam: Appears calm and comfortable Respiratory system: Clear and diminished. Respiratory effort normal. Cardiovascular system: Regular rate and rhythm. Normal S1 and S2. No heart murmurs present. No extra heart sounds Gastrointestinal system: Abdomen is nondistended, soft and nontender. Normal bowel sounds heard. External hemorrhoid seen without bleeding. No internal hemorrhoids  felt. No frank blood on DRE Central nervous system: Alert and oriented. No focal neurological deficits. Extremities: No edema. No calf tenderness Skin: No  cyanosis. No rashes Psychiatry: Judgement and insight appear normal. Mood & affect appropriate.     Data Reviewed: I have personally reviewed following labs and imaging studies  CBC: Recent Labs  Lab 03/08/17 1803 03/09/17 0800 03/10/17 0450 03/11/17 0542 03/12/17 0452 03/13/17 0745  WBC 9.2 11.2* 9.7 8.6 6.5 6.1  NEUTROABS 7.3 9.4*  --  5.7  --   --   HGB 10.4* 9.8* 8.9* 8.9* 8.3* 7.6*  HCT 31.4* 29.8* 26.7* 27.0* 25.4* 23.3*  MCV 100.6* 101.4* 101.5* 99.6 100.4* 99.6  PLT 219 210 180 187 171 867   Basic Metabolic Panel: Recent Labs  Lab 03/09/17 0800 03/09/17 1317 03/11/17 0542 03/12/17 0452 03/13/17 0745  NA 134* 133* 135 137 136  K 5.8* 4.9 5.6* 4.7 4.5  CL 92* 94* 93* 95* 95*  CO2 18* 19* 20* 25 23  GLUCOSE 78 80 87 93 101*  BUN 74* 54* 75* 36* 46*  CREATININE 20.58* 15.97* 20.75* 12.55* 15.05*  CALCIUM 8.5* 8.2* 8.1* 7.9* 8.1*  MG  --   --  2.0  --   --   PHOS 4.7*  --  3.7  --  4.0   GFR: Estimated Creatinine Clearance: 5.9 mL/min (A) (by C-G formula based on SCr of 15.05 mg/dL (H)). Liver Function Tests: Recent Labs  Lab 03/08/17 2145 03/09/17 0800 03/11/17 0542 03/13/17 0745  AST 18  --  15  --   ALT 9*  --  10*  --   ALKPHOS 49  --  40  --   BILITOT 0.8  --  1.1  --   PROT 6.9  --  6.2*  --   ALBUMIN 3.4* 3.0* 2.8* 2.5*   No results for input(s): LIPASE, AMYLASE in the last 168 hours. No results for input(s): AMMONIA in the last 168 hours. Coagulation Profile: No results for input(s): INR, PROTIME in the last 168 hours. Cardiac Enzymes: No results for input(s): CKTOTAL, CKMB, CKMBINDEX, TROPONINI in the last 168 hours. BNP (last 3 results) No results for input(s): PROBNP in the last 8760 hours. HbA1C: No results for input(s): HGBA1C in the last 72 hours. CBG: No results for input(s): GLUCAP in the last 168 hours. Lipid Profile: No results for input(s): CHOL, HDL, LDLCALC, TRIG, CHOLHDL, LDLDIRECT in the last 72 hours. Thyroid Function  Tests: No results for input(s): TSH, T4TOTAL, FREET4, T3FREE, THYROIDAB in the last 72 hours. Anemia Panel: No results for input(s): VITAMINB12, FOLATE, FERRITIN, TIBC, IRON, RETICCTPCT in the last 72 hours. Sepsis Labs: Recent Labs  Lab 03/08/17 1817 03/08/17 2152 03/09/17 0800 03/12/17 0735 03/13/17 0800  PROCALCITON  --   --  17.38 >150.00 >150.00  LATICACIDVEN 1.06 2.0*  --   --   --     Recent Results (from the past 240 hour(s))  Blood culture (routine x 2)     Status: None   Collection Time: 03/08/17  6:03 PM  Result Value Ref Range Status   Specimen Description BLOOD LEFT ARM  Final   Special Requests IN PEDIATRIC BOTTLE Blood Culture adequate volume  Final   Culture   Final    NO GROWTH 5 DAYS Performed at [facility], [place] [address], [place], [place] [address]    Report Status 03/13/2017 FINAL  Final  MRSA PCR Screening     Status: None  Collection Time: 03/09/17  6:16 AM  Result Value Ref Range Status   MRSA by PCR NEGATIVE NEGATIVE Final    Comment:        The GeneXpert MRSA Assay (FDA approved for NASAL specimens only), is one component of a comprehensive MRSA colonization surveillance program. It is not intended to diagnose MRSA infection nor to guide or monitor treatment for MRSA infections. Performed at [facility], [place] [address], [place], [place] [address]   Culture, blood (single)     Status: None (Preliminary result)   Collection Time: 03/09/17 10:45 AM  Result Value Ref Range Status   Specimen Description BLOOD HEMODIALYSIS CATHETER  Final   Special Requests   Final    BOTTLES DRAWN AEROBIC AND ANAEROBIC Blood Culture results may not be optimal due to an excessive volume of blood received in culture bottles   Culture   Final    NO GROWTH 4 DAYS Performed at [facility], [place] [address] [place], [place] [address]    Report Status PENDING  Incomplete  Respiratory Panel by PCR     Status: None   Collection Time: 03/10/17   1:12 PM  Result Value Ref Range Status   Adenovirus NOT DETECTED NOT DETECTED Final   Coronavirus 229E NOT DETECTED NOT DETECTED Final   Coronavirus HKU1 NOT DETECTED NOT DETECTED Final   Coronavirus NL63 NOT DETECTED NOT DETECTED Final   Coronavirus OC43 NOT DETECTED NOT DETECTED Final   Metapneumovirus NOT DETECTED NOT DETECTED Final   Rhinovirus / Enterovirus NOT DETECTED NOT DETECTED Final   Influenza A NOT DETECTED NOT DETECTED Final   Influenza B NOT DETECTED NOT DETECTED Final   Parainfluenza Virus 1 NOT DETECTED NOT DETECTED Final   Parainfluenza Virus 2 NOT DETECTED NOT DETECTED Final   Parainfluenza Virus 3 NOT DETECTED NOT DETECTED Final   Parainfluenza Virus 4 NOT DETECTED NOT DETECTED Final   Respiratory Syncytial Virus NOT DETECTED NOT DETECTED Final   Bordetella pertussis NOT DETECTED NOT DETECTED Final   Chlamydophila pneumoniae NOT DETECTED NOT DETECTED Final   Mycoplasma pneumoniae NOT DETECTED NOT DETECTED Final    Comment: Performed at [facility], [place] [address] [place], [place] [address]  Culture, expectorated sputum-assessment     Status: None   Collection Time: 03/12/17 12:44 PM  Result Value Ref Range Status   Specimen Description SPUTUM  Final   Special Requests NONE  Final   Sputum evaluation   Final    Sputum specimen not acceptable for testing.  Please recollect.   Results Called to: T. FAIRING, RN AT [place] ON 03/12/17 BY C. JESSUP, MLT. Performed at [facility], [place] [address], [place], [place] [address]    Report Status 03/12/2017 FINAL  Final         Radiology Studies: Dg Orthopantogram  Result Date: 03/12/2017 CLINICAL DATA:  Right tooth pain and possible tooth abscess for the past 2 weeks. Treated with antibiotics for the past week. EXAM: ORTHOPANTOGRAM/PANORAMIC COMPARISON:  None. FINDINGS: Surgically absent right lower 3rd molar. There is a 10 mm rounded lucency beneath a metallic dental filling in the right lower 1st molar. No  periapical lucencies are seen. IMPRESSION: Probable large cavity beneath a metallic dental filling in the right lower 1st molar. No associated periapical abscess seen. Electronically Signed   By: Claudie Revering M.D.   On: 03/12/2017 17:19   Ct Chest W Contrast  Result Date: 03/11/2017 CLINICAL DATA:  Pneumonia, pain  with breathing, history of end-stage renal disease on dialysis, COPD, cirrhosis, hypertension EXAM: CT CHEST WITH CONTRAST TECHNIQUE: Multidetector CT imaging of the chest was performed during intravenous contrast administration. CONTRAST:  75 mL ISOVUE-300 IOPAMIDOL (ISOVUE-300) INJECTION 61% IV COMPARISON:  Chest radiograph 03/08/2017; prior CT chest 07/12/2013 FINDINGS: Cardiovascular: Atherosclerotic calcifications aorta, coronary arteries and proximal great vessels. Aorta normal caliber. Minimal pericardial fluid. Heart otherwise unremarkable. RIGHT jugular central venous catheter with tip at superior RIGHT atrium. Mediastinum/Nodes: Esophagus normal appearance. Scattered normal size mediastinal lymph nodes. No thoracic adenopathy. Base of cervical region normal appearance. Lungs/Pleura: Patchy airspace consolidation in the posterior segment of the RIGHT upper lobe and in BILATERAL lower lobes RIGHT greater than LEFT. Bleb at lateral RIGHT lung base. Minimal atelectasis in posterior LEFT upper lobe adjacent to major fissure. Very minimal LEFT pleural effusion. No pneumothorax. Upper Abdomen: Contracted gallbladder. Atrophic upper pole LEFT kidney with small cysts consistent with history of end-stage renal disease. Nodular appearing liver consistent with history of cirrhosis. Remaining visualized upper abdomen otherwise unremarkable. 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 IMPRESSION: Patchy airspace infiltrates in RIGHT upper lobe and to lesser degrees in RIGHT greater than LEFT lower lobes favoring pneumonia. Extensive atherosclerotic disease changes  including coronary arteries. Aortic Atherosclerosis (ICD10-I70.0). Electronically Signed   By: Lavonia Dana M.D.   On: 03/11/2017 20:14   Dg Chest Port 1 View  Result Date: 03/13/2017 CLINICAL DATA:  Follow-up pulmonary edema and/or pneumonia. End-stage renal disease on hemodialysis. EXAM: PORTABLE CHEST 1 VIEW COMPARISON:  03/12/2017, 03/08/2017 and earlier, including CT chest 03/11/2017 and earlier. FINDINGS: Cardiac silhouette moderately enlarged, unchanged. Persistent mild ground-glass airspace opacities in the right lower lobe and to a lesser degree the right upper lobe, with gradual improvement over the past 2 days. Mild pulmonary venous hypertension without overt edema currently. No new pulmonary parenchymal abnormalities. No visible pleural effusions. Right jugular dialysis catheter tip projects at or near the cavoatrial junction, unchanged. IMPRESSION: 1. Gradually improving ground-glass airspace disease in the right lung over the past 2 days, indicating improving pneumonia. 2. No new abnormalities. Electronically Signed   By: Evangeline Dakin M.D.   On: 03/13/2017 17:11   Dg Chest Port 1 View  Result Date: 03/12/2017 CLINICAL DATA:  Short of breath EXAM: PORTABLE CHEST 1 VIEW COMPARISON:  03/11/2017, 03/08/2017 FINDINGS: Dialysis access catheter at the cavoatrial junction unchanged. Bibasilar airspace disease is more prominent and could represent edema or pneumonia. Mild vascular congestion. No effusion. Cardiac enlargement IMPRESSION: Mild progression of bibasilar airspace disease. Probable edema versus pneumonia. Electronically Signed   By: Franchot Gallo M.D.   On: 03/12/2017 09:27        Scheduled Meds: . albuterol  10 mg Nebulization Once  . cinacalcet  60 mg Oral Q M,W,F-HD  . cloNIDine  0.3 mg Transdermal Q Sat  . guaiFENesin  1,200 mg Oral BID  . pantoprazole  80 mg Oral Daily  . sevelamer carbonate  3,200 mg Oral TID WC  . tamsulosin  0.4 mg Oral QODAY   Continuous  Infusions: . ampicillin-sulbactam (UNASYN) IV Stopped (03/13/17 1430)     LOS: 4 days     Cordelia Poche, MD [facility] 03/13/2017, 5:39 PM Pager: [phone]  If 7PM-7AM, please contact night-coverage [email or web address] Password [facility] 03/13/2017, 5:39 PM

## 2017-03-13 NOTE — Progress Notes (Signed)
Followed up with pharmacy regarding vancomycin and was informed the vancomycin was discontinued yesterday, so no dose is due.

## 2017-03-13 NOTE — Progress Notes (Signed)
[facility] Progress Note  Subjective: high spiking temps again.  Repeat CXR yest showed IS pattern c/w edema, vs other.  Reports coughing but not during interview. No SOB.  No hemoptysis.  Had teeth xray'd yesterday per ID, has been c/o jaw pain recently.    Vitals:   03/13/17 0745 03/13/17 0815 03/13/17 0845 03/13/17 0915  BP: (!) 170/89 (!) 167/97 (!) 177/98 (!) 142/72  Pulse: 72 78 75 72  Resp: (!) 23  20 (!) 29  Temp:      TempSrc:      SpO2:    98%  Weight:      Height:        Inpatient medications: . albuterol  10 mg Nebulization Once  . cinacalcet  60 mg Oral Q M,W,F-HD  . cloNIDine  0.3 mg Transdermal Q Sat  . guaiFENesin  1,200 mg Oral BID  . pantoprazole  80 mg Oral Daily  . sevelamer carbonate  3,200 mg Oral TID WC  . tamsulosin  0.4 mg Oral QODAY   . ampicillin-sulbactam (UNASYN) IV 3 g (03/12/17 1722)   acetaminophen, diphenhydrAMINE **AND** [DISCONTINUED] acetaminophen, gi cocktail, hydrALAZINE, HYDROmorphone (DILAUDID) injection, hydrOXYzine, ipratropium-albuterol, ondansetron **OR** ondansetron (ZOFRAN) IV, oxyCODONE  Exam: Alert, no distress NO jvd Chest clear bilat RRR Abd soft ntnd Ext no wounds or edema NF, ox3  Dialysis: MWF [facility] 3.5h   90kg   2/2 bath   TDC (exchanged 1/31 CKV low BFR)  P4   Hep 4000 + 1K mid VDRA: C3 3.5mg qTx PARSABIV: 571mqTx EPO: mircera 60 q2wk, last given 1/30 Home BP: clonidine patch 0.3 weekly       Impression: 1  Fevers - persistent, unclear cause, resp PCR and blood cx's neg. ID evaluating.  2  ESRD HD mwf 3  Hyperkalemia, resolved 4  Abd pain/ N/V, improved 5  Chron TDC 6  HTN BP's better 7  Anemia ckd on ESA 8  MBD ckd, cont meds 9  Vol stable ,but CXR yest could be c/w edema   Plan - HD today, challenge volume  RoKelly SplinterD [facility] pager [phone] 03/13/2017, 10:35 AM   Recent Labs  Lab 03/09/17 0800 03/09/17 1317 03/11/17 0542 03/12/17 0452  NA 134*  133* 135 137  K 5.8* 4.9 5.6* 4.7  CL 92* 94* 93* 95*  CO2 18* 19* 20* 25  GLUCOSE 78 80 87 93  BUN 74* 54* 75* 36*  CREATININE 20.58* 15.97* 20.75* 12.55*  CALCIUM 8.5* 8.2* 8.1* 7.9*  PHOS 4.7*  --  3.7  --    Recent Labs  Lab 03/08/17 2145 03/09/17 0800 03/11/17 0542  AST 18  --  15  ALT 9*  --  10*  ALKPHOS 49  --  40  BILITOT 0.8  --  1.1  PROT 6.9  --  6.2*  ALBUMIN 3.4* 3.0* 2.8*   Recent Labs  Lab 03/08/17 1803 03/09/17 0800 03/10/17 0450 03/11/17 0542 03/12/17 0452  WBC 9.2 11.2* 9.7 8.6 6.5  NEUTROABS 7.3 9.4*  --  5.7  --   HGB 10.4* 9.8* 8.9* 8.9* 8.3*  HCT 31.4* 29.8* 26.7* 27.0* 25.4*  MCV 100.6* 101.4* 101.5* 99.6 100.4*  PLT 219 210 180 187 171   Iron/TIBC/Ferritin/ %Sat    Component Value Date/Time   IRON 58 07/10/2013 1500   TIBC 164 (L) 07/10/2013 1500   FERRITIN 1,060 (H) 07/10/2013 1500   IRONPCTSAT 35 07/10/2013 1500

## 2017-03-13 NOTE — Consult Note (Signed)
   [facility] CM Inpatient Consult   03/13/2017  James Wall [facility] [birth year] [id number]  Patient screened for potential [place] Management services. Patient is in the [place] of the [place] Management services under patient's Medicare plan. Came by to see patient , no bed in room, and CNA said he was in dialysis.  Patient has low re-admit risk when chart reviewed.  Please place a [facility] Care Management consult or for questions contact:   Natividad Brood, RN BSN [facility] Liaison  [phone] business mobile phone Toll free office [phone]

## 2017-03-13 NOTE — Plan of Care (Signed)
  Completed/Met Education: Knowledge of General Education information will improve 03/13/2017 1154 - Completed/Met by Alonna Buckler, RN Activity: Risk for activity intolerance will decrease 03/13/2017 1154 - Completed/Met by Alonna Buckler, RN Skin Integrity: Risk for impaired skin integrity will decrease 03/13/2017 1154 - Completed/Met by Alonna Buckler, RN

## 2017-03-13 NOTE — Progress Notes (Signed)
Subjective: Patient had episode of vomiting last night; has new abd pain which he attributes to coughing. He has a less productive cough today. He continues to endorse rigors.  Antibiotics:  Anti-infectives (From admission, onward)   Start     Dose/Rate Route Frequency Ordered Stop   03/13/17 1200  ceFEPIme (MAXIPIME) 2 g in dextrose 5 % 50 mL IVPB  Status:  Discontinued     2 g 100 mL/hr over 30 Minutes Intravenous Every M-W-F (Hemodialysis) 03/12/17 0736 03/12/17 1518   03/12/17 1600  Ampicillin-Sulbactam (UNASYN) 3 g in sodium chloride 0.9 % 100 mL IVPB     3 g 200 mL/hr over 30 Minutes Intravenous Every 24 hours 03/12/17 1531     03/09/17 1200  vancomycin (VANCOCIN) IVPB 1000 mg/200 mL premix  Status:  Discontinued     1,000 mg 200 mL/hr over 60 Minutes Intravenous Every M-W-F (Hemodialysis) 03/09/17 0733 03/12/17 1518   03/08/17 2045  vancomycin (VANCOCIN) 1,750 mg in sodium chloride 0.9 % 500 mL IVPB     1,750 mg 250 mL/hr over 120 Minutes Intravenous  Once 03/08/17 2033 03/09/17 0231   03/08/17 2045  ceFEPIme (MAXIPIME) 1 g in dextrose 5 % 50 mL IVPB  Status:  Discontinued     1 g 100 mL/hr over 30 Minutes Intravenous Every 24 hours 03/08/17 2033 03/12/17 1518     Medications: Scheduled Meds: . albuterol  10 mg Nebulization Once  . cinacalcet  60 mg Oral Q M,W,F-HD  . cloNIDine  0.3 mg Transdermal Q Sat  . guaiFENesin  1,200 mg Oral BID  . pantoprazole  80 mg Oral Daily  . sevelamer carbonate  3,200 mg Oral TID WC  . tamsulosin  0.4 mg Oral QODAY   Continuous Infusions: . ampicillin-sulbactam (UNASYN) IV 3 g (03/12/17 1722)   PRN Meds:.acetaminophen, diphenhydrAMINE **AND** [DISCONTINUED] acetaminophen, gi cocktail, hydrALAZINE, HYDROmorphone (DILAUDID) injection, hydrOXYzine, ipratropium-albuterol, ondansetron **OR** ondansetron (ZOFRAN) IV, oxyCODONE  Objective: Weight change: -6 oz (-1.53 kg)  Intake/Output Summary (Last 24 hours) at 03/13/2017  1142 Last data filed at 03/13/2017 1040 Gross per 24 hour  Intake 120 ml  Output 2578 ml  Net -2458 ml   Blood pressure (!) 162/87, pulse 85, temperature 99.1 F (37.3 C), temperature source Oral, resp. rate 19, height 5' 5"  (1.651 m), weight 198 lb 13.7 oz (90.2 kg), SpO2 99 %. Temp:  [99.1 F (37.3 C)-102.9 F (39.4 C)] 99.1 F (37.3 C) (02/08 1040) Pulse Rate:  [72-91] 85 (02/08 1040) Resp:  [19-29] 19 (02/08 1040) BP: (142-207)/(72-103) 162/87 (02/08 1040) SpO2:  [94 %-99 %] 99 % (02/08 1040) Weight:  [198 lb 13.7 oz (90.2 kg)-205 lb 14.6 oz (93.4 kg)] 198 lb 13.7 oz (90.2 kg) (02/08 1040)  Physical Exam: Constitutional: NAD, lying in bed comfortably  CV: RRR, no M/R/G, pulses intact Resp: CTAB, no increased work of breathing, speaking in full sentences Abd: obese, soft, generalized tenderness Skin: no rash, abscess Neuro: A&Ox3, CN grossly intact  CBC: CBC Latest Ref Rng & Units 03/13/2017 03/12/2017 03/11/2017  WBC 4.0 - 10.5 K/uL 6.1 6.5 8.6  Hemoglobin 13.0 - 17.0 g/dL 7.6(L) 8.3(L) 8.9(L)  Hematocrit 39.0 - 52.0 % 23.3(L) 25.4(L) 27.0(L)  Platelets 150 - 400 K/uL 185 171 187   BMET Recent Labs    03/12/17 0452 03/13/17 0745  NA 137 136  K 4.7 4.5  CL 95* 95*  CO2 25 23  GLUCOSE 93 101*  BUN 36* 46*  CREATININE 12.55* 15.05*  CALCIUM 7.9* 8.1*   Liver Panel Recent Labs    03/11/17 0542 03/13/17 0745  PROT 6.2*  --   ALBUMIN 2.8* 2.5*  AST 15  --   ALT 10*  --   ALKPHOS 40  --   BILITOT 1.1  --     Sedimentation Rate No results for input(s): ESRSEDRATE in the last 72 hours. C-Reactive Protein No results for input(s): CRP in the last 72 hours.  Micro Results: Recent Results (from the past 720 hour(s))  Blood culture (routine x 2)     Status: None (Preliminary result)   Collection Time: 03/08/17  6:03 PM  Result Value Ref Range Status   Specimen Description BLOOD LEFT ARM  Final   Special Requests IN PEDIATRIC BOTTLE Blood Culture adequate volume   Final   Culture   Final    NO GROWTH 4 DAYS Performed at [facility], [address], [place], [place] [address]    Report Status PENDING  Incomplete  MRSA PCR Screening     Status: None   Collection Time: 03/09/17  6:16 AM  Result Value Ref Range Status   MRSA by PCR NEGATIVE NEGATIVE Final    Comment:        The GeneXpert MRSA Assay (FDA approved for NASAL specimens only), is one component of a comprehensive MRSA colonization surveillance program. It is not intended to diagnose MRSA infection nor to guide or monitor treatment for MRSA infections. Performed at [facility], [place] [address] [place], [place] [address]   Culture, blood (single)     Status: None (Preliminary result)   Collection Time: 03/09/17 10:45 AM  Result Value Ref Range Status   Specimen Description BLOOD HEMODIALYSIS CATHETER  Final   Special Requests   Final    BOTTLES DRAWN AEROBIC AND ANAEROBIC Blood Culture results may not be optimal due to an excessive volume of blood received in culture bottles   Culture   Final    NO GROWTH 3 DAYS Performed at [facility], [place] [address], [place], [place] [address]    Report Status PENDING  Incomplete  Respiratory Panel by PCR     Status: None   Collection Time: 03/10/17  1:12 PM  Result Value Ref Range Status   Adenovirus NOT DETECTED NOT DETECTED Final   Coronavirus 229E NOT DETECTED NOT DETECTED Final   Coronavirus HKU1 NOT DETECTED NOT DETECTED Final   Coronavirus NL63 NOT DETECTED NOT DETECTED Final   Coronavirus OC43 NOT DETECTED NOT DETECTED Final   Metapneumovirus NOT DETECTED NOT DETECTED Final   Rhinovirus / Enterovirus NOT DETECTED NOT DETECTED Final   Influenza A NOT DETECTED NOT DETECTED Final   Influenza B NOT DETECTED NOT DETECTED Final   Parainfluenza Virus 1 NOT DETECTED NOT DETECTED Final   Parainfluenza Virus 2 NOT DETECTED NOT DETECTED Final   Parainfluenza Virus 3 NOT DETECTED NOT DETECTED Final   Parainfluenza Virus  4 NOT DETECTED NOT DETECTED Final   Respiratory Syncytial Virus NOT DETECTED NOT DETECTED Final   Bordetella pertussis NOT DETECTED NOT DETECTED Final   Chlamydophila pneumoniae NOT DETECTED NOT DETECTED Final   Mycoplasma pneumoniae NOT DETECTED NOT DETECTED Final    Comment: Performed at [facility], [place] [address] [place], [place] [address]  Culture, expectorated sputum-assessment     Status: None   Collection Time: 03/12/17 12:44 PM  Result Value Ref Range Status   Specimen Description SPUTUM  Final   Special Requests NONE  Final   Sputum evaluation   Final    Sputum specimen not acceptable for testing.  Please recollect.   Results Called to: T. FAIRING, RN AT [place] ON 03/12/17 BY C. JESSUP, MLT. Performed at [facility], [place] [address], [place], [place] [address]    Report Status 03/12/2017 FINAL  Final   Studies/Results: Dg Orthopantogram  Result Date: 03/12/2017 CLINICAL DATA:  Right tooth pain and possible tooth abscess for the past 2 weeks. Treated with antibiotics for the past week. EXAM: ORTHOPANTOGRAM/PANORAMIC COMPARISON:  None. FINDINGS: Surgically absent right lower 3rd molar. There is a 10 mm rounded lucency beneath a metallic dental filling in the right lower 1st molar. No periapical lucencies are seen. IMPRESSION: Probable large cavity beneath a metallic dental filling in the right lower 1st molar. No associated periapical abscess seen. Electronically Signed   By: Claudie Revering M.D.   On: 03/12/2017 17:19   Ct Chest W Contrast  Result Date: 03/11/2017 CLINICAL DATA:  Pneumonia, pain with breathing, history of end-stage renal disease on dialysis, COPD, cirrhosis, hypertension EXAM: CT CHEST WITH CONTRAST TECHNIQUE: Multidetector CT imaging of the chest was performed during intravenous contrast administration. CONTRAST:  75 mL ISOVUE-300 IOPAMIDOL (ISOVUE-300) INJECTION 61% IV COMPARISON:  Chest radiograph 03/08/2017; prior CT chest 07/12/2013 FINDINGS:  Cardiovascular: Atherosclerotic calcifications aorta, coronary arteries and proximal great vessels. Aorta normal caliber. Minimal pericardial fluid. Heart otherwise unremarkable. RIGHT jugular central venous catheter with tip at superior RIGHT atrium. Mediastinum/Nodes: Esophagus normal appearance. Scattered normal size mediastinal lymph nodes. No thoracic adenopathy. Base of cervical region normal appearance. Lungs/Pleura: Patchy airspace consolidation in the posterior segment of the RIGHT upper lobe and in BILATERAL lower lobes RIGHT greater than LEFT. Bleb at lateral RIGHT lung base. Minimal atelectasis in posterior LEFT upper lobe adjacent to major fissure. Very minimal LEFT pleural effusion. No pneumothorax. Upper Abdomen: Contracted gallbladder. Atrophic upper pole LEFT kidney with small cysts consistent with history of end-stage renal disease. Nodular appearing liver consistent with history of cirrhosis. Remaining visualized upper abdomen otherwise unremarkable. 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 IMPRESSION: Patchy airspace infiltrates in RIGHT upper lobe and to lesser degrees in RIGHT greater than LEFT lower lobes favoring pneumonia. Extensive atherosclerotic disease changes including coronary arteries. Aortic Atherosclerosis (ICD10-I70.0). Electronically Signed   By: Lavonia Dana M.D.   On: 03/11/2017 20:14   Dg Chest Port 1 View  Result Date: 03/12/2017 CLINICAL DATA:  Short of breath EXAM: PORTABLE CHEST 1 VIEW COMPARISON:  03/11/2017, 03/08/2017 FINDINGS: Dialysis access catheter at the cavoatrial junction unchanged. Bibasilar airspace disease is more prominent and could represent edema or pneumonia. Mild vascular congestion. No effusion. Cardiac enlargement IMPRESSION: Mild progression of bibasilar airspace disease. Probable edema versus pneumonia. Electronically Signed   By: Franchot Gallo M.D.   On: 03/12/2017 09:27   Assessment/Plan:  INTERVAL  HISTORY:  Orthopantogram negative for dental abscess  Principal Problem:   Sepsis due to pneumonia [facility]) Active Problems:   GERD (gastroesophageal reflux disease)   Anemia of renal disease   ESRD on dialysis ([place])   Hyperkalemia   Hypertensive urgency   Tooth abscess   Lobar pneumonia, unspecified organism ([place])   Darvell Monteforte is a 52 y.o. 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 Imaging is not consistent with MRSA pneumonia and  no other exposures to indicate other source of atypical infection. Respiratory panel is negative; legionella and strep pneumo antigens were negative. He is HIV negative. He does have poor dentition and dental pain; orthopantogram was obtained which showed 1st molar cavity but no associated abscess.  With his cirrhosis, need to consider SBP on the differential as well, however, he would have been adequately covered on previous regimen.  Plan: 1. Continue unasyn 2. Trend fever curve 3. Consider u/s abdomen +/- diagnostic paracentesis if ascites present if fevers persist into weekend; he did get outpt abx that would presumably cover anaerobes for his dental infection so may have resistance. No abscess or ascites seen on CT on admission, however this was prior to abd pain/nausea onset.   LOS: 4 days   Alphonzo Grieve 03/13/2017, 11:42 AM

## 2017-03-14 ENCOUNTER — Inpatient Hospital Stay (HOSPITAL_COMMUNITY): Payer: Medicare Other

## 2017-03-14 DIAGNOSIS — J181 Lobar pneumonia, unspecified organism: Secondary | ICD-10-CM

## 2017-03-14 DIAGNOSIS — R112 Nausea with vomiting, unspecified: Secondary | ICD-10-CM

## 2017-03-14 DIAGNOSIS — R1032 Left lower quadrant pain: Secondary | ICD-10-CM

## 2017-03-14 DIAGNOSIS — R509 Fever, unspecified: Secondary | ICD-10-CM

## 2017-03-14 LAB — BASIC METABOLIC PANEL
ANION GAP: 15 (ref 5–15)
BUN: 29 mg/dL — ABNORMAL HIGH (ref 6–20)
CO2: 25 mmol/L (ref 22–32)
CREATININE: 10.95 mg/dL — AB (ref 0.61–1.24)
Calcium: 7.8 mg/dL — ABNORMAL LOW (ref 8.9–10.3)
Chloride: 97 mmol/L — ABNORMAL LOW (ref 101–111)
GFR calc Af Amer: 5 mL/min — ABNORMAL LOW (ref >60)
GFR calc non Af Amer: 5 mL/min — ABNORMAL LOW (ref >60)
GLUCOSE: 94 mg/dL (ref 65–99)
Potassium: 4.4 mmol/L (ref 3.5–5.1)
Sodium: 137 mmol/L (ref 135–145)

## 2017-03-14 LAB — CULTURE, BLOOD (SINGLE): Culture: NO GROWTH

## 2017-03-14 LAB — CBC
HEMATOCRIT: 24.7 % — AB (ref 39.0–52.0)
Hemoglobin: 8 g/dL — ABNORMAL LOW (ref 13.0–17.0)
MCH: 32.8 pg (ref 26.0–34.0)
MCHC: 32.4 g/dL (ref 30.0–36.0)
MCV: 101.2 fL — AB (ref 78.0–100.0)
Platelets: 199 10*3/uL (ref 150–400)
RBC: 2.44 MIL/uL — ABNORMAL LOW (ref 4.22–5.81)
RDW: 16 % — AB (ref 11.5–15.5)
WBC: 7.2 10*3/uL (ref 4.0–10.5)

## 2017-03-14 LAB — HIV ANTIBODY (ROUTINE TESTING W REFLEX): HIV Screen 4th Generation wRfx: NONREACTIVE

## 2017-03-14 MED ORDER — AMLODIPINE BESYLATE 5 MG PO TABS
5.0000 mg | ORAL_TABLET | Freq: Every day | ORAL | Status: DC
  Administered 2017-03-14 – 2017-03-16 (×3): 5 mg via ORAL
  Filled 2017-03-14 (×3): qty 1

## 2017-03-14 NOTE — Progress Notes (Signed)
Notified Dr. Georgena Spurling of patient's BP being elevated. He is adding different BP medication.

## 2017-03-14 NOTE — Progress Notes (Signed)
Patient ID: James Wall, male   DOB: 1965-12-02, 52 y.o.   MRN: [id number]          [facility]  Date of Admission:  03/08/2017    Total days of antibiotics 7        Day 4 ampicillin sulbactam         ASSESSMENT: I suspect that his persistent fevers are due to right lower lobe pneumonia that seems to be improving radiographically.  Abdominal ultrasound did not reveal any acute abnormalities and he now tells me that he believes his pain is due to coughing recently.  PLAN: 1. Continue ampicillin sulbactam  Principal Problem:   FUO (fever of unknown origin) Active Problems:   HCAP (healthcare-associated pneumonia)   GERD (gastroesophageal reflux disease)   Anemia of renal disease   ESRD on dialysis ([facility])   Hyperkalemia   Hypertensive urgency   Sepsis due to pneumonia ([facility])   Tooth abscess   Lobar pneumonia, unspecified organism ([facility])   Cirrhosis ([facility])   LLQ pain   Scheduled Meds: . albuterol  10 mg Nebulization Once  . amLODipine  5 mg Oral Daily  . cinacalcet  60 mg Oral Q M,W,F-HD  . cloNIDine  0.3 mg Transdermal Q Sat  . guaiFENesin  1,200 mg Oral BID  . pantoprazole  80 mg Oral Daily  . sevelamer carbonate  3,200 mg Oral TID WC  . tamsulosin  0.4 mg Oral QODAY   Continuous Infusions: . ampicillin-sulbactam (UNASYN) IV 3 g (03/14/17 1410)   PRN Meds:.acetaminophen, diphenhydrAMINE **AND** [DISCONTINUED] acetaminophen, gi cocktail, hydrALAZINE, HYDROmorphone (DILAUDID) injection, hydrOXYzine, ipratropium-albuterol, ondansetron **OR** ondansetron (ZOFRAN) IV, oxyCODONE   SUBJECTIVE: He says that he is feeling a little bit better.  He still has a cough productive of thick white sputum but this is a little bit better.  He is also having left-sided abdominal pain but feels like this is related to his recent cough.  He noticed some blood in the toilet after a bowel movement yesterday.  Review of Systems: Review of Systems    Constitutional: Positive for chills, fever and malaise/fatigue. Negative for diaphoresis and weight loss.  HENT: Negative for congestion and sore throat.   Respiratory: Positive for cough, sputum production and shortness of breath. Negative for hemoptysis and wheezing.   Cardiovascular: Negative for chest pain.  Gastrointestinal: Positive for abdominal pain, nausea and vomiting. Negative for constipation and diarrhea.       He has chronic, intermittent nausea and vomiting.  Musculoskeletal: Negative for myalgias.  Skin: Negative for itching and rash.  Neurological: Negative for headaches.    Allergies  Allergen Reactions  . Aspirin Other (See Comments)    Made congestion worse    OBJECTIVE: Vitals:   03/14/17 0900 03/14/17 1136 03/14/17 1223 03/14/17 1234  BP: (!) 176/94  (!) 136/107 (!) 170/100  Pulse: 94  (!) 101   Resp:   18   Temp: (!) 100.4 F (38 C) 98.6 F (37 C)    TempSrc: Oral Oral    SpO2: 97%  99%   Weight:      Height:       Body mass index is 33 kg/m.  Physical Exam  Constitutional: He is oriented to person, place, and time.  He is alert and in no distress.  He is very pleasant and talkative.  HENT:  Mouth/Throat: No oropharyngeal exudate.  Eyes: Conjunctivae are normal.  Neck: Neck supple.  Cardiovascular: Normal rate and regular rhythm.  No murmur heard. Pulmonary/Chest: Effort normal. He has no wheezes. He has no rales.  Right chest dialysis catheter site appears normal.  Abdominal: Soft. He exhibits no distension. There is tenderness.  Mild, left-sided abdominal wall tenderness with palpation.  Musculoskeletal: Normal range of motion. He exhibits no edema or tenderness.  Neurological: He is alert and oriented to person, place, and time.  Skin: No rash noted.    Lab Results Lab Results  Component Value Date   WBC 7.2 03/14/2017   HGB 8.0 (L) 03/14/2017   HCT 24.7 (L) 03/14/2017   MCV 101.2 (H) 03/14/2017   PLT 199 03/14/2017    Lab  Results  Component Value Date   CREATININE 10.95 (H) 03/14/2017   BUN 29 (H) 03/14/2017   NA 137 03/14/2017   K 4.4 03/14/2017   CL 97 (L) 03/14/2017   CO2 25 03/14/2017    Lab Results  Component Value Date   ALT 10 (L) 03/11/2017   AST 15 03/11/2017   ALKPHOS 40 03/11/2017   BILITOT 1.1 03/11/2017     Microbiology: Recent Results (from the past 240 hour(s))  Blood culture (routine x 2)     Status: None   Collection Time: 03/08/17  6:03 PM  Result Value Ref Range Status   Specimen Description BLOOD LEFT ARM  Final   Special Requests IN PEDIATRIC BOTTLE Blood Culture adequate volume  Final   Culture   Final    NO GROWTH 5 DAYS Performed at [facility], [place] [address] [place], [place] [address]    Report Status 03/13/2017 FINAL  Final  MRSA PCR Screening     Status: None   Collection Time: 03/09/17  6:16 AM  Result Value Ref Range Status   MRSA by PCR NEGATIVE NEGATIVE Final    Comment:        The GeneXpert MRSA Assay (FDA approved for NASAL specimens only), is one component of a comprehensive MRSA colonization surveillance program. It is not intended to diagnose MRSA infection nor to guide or monitor treatment for MRSA infections. Performed at [facility], [place] [address], [place], [place] [address]   Culture, blood (single)     Status: None   Collection Time: 03/09/17 10:45 AM  Result Value Ref Range Status   Specimen Description BLOOD HEMODIALYSIS CATHETER  Final   Special Requests   Final    BOTTLES DRAWN AEROBIC AND ANAEROBIC Blood Culture results may not be optimal due to an excessive volume of blood received in culture bottles   Culture   Final    NO GROWTH 5 DAYS Performed at [facility], [place] [address], [place], [place] [address]    Report Status 03/14/2017 FINAL  Final  Respiratory Panel by PCR     Status: None   Collection Time: 03/10/17  1:12 PM  Result Value Ref Range Status   Adenovirus NOT DETECTED NOT DETECTED Final    Coronavirus 229E NOT DETECTED NOT DETECTED Final   Coronavirus HKU1 NOT DETECTED NOT DETECTED Final   Coronavirus NL63 NOT DETECTED NOT DETECTED Final   Coronavirus OC43 NOT DETECTED NOT DETECTED Final   Metapneumovirus NOT DETECTED NOT DETECTED Final   Rhinovirus / Enterovirus NOT DETECTED NOT DETECTED Final   Influenza A NOT DETECTED NOT DETECTED Final   Influenza B NOT DETECTED NOT DETECTED Final   Parainfluenza Virus 1 NOT DETECTED NOT DETECTED Final   Parainfluenza Virus 2 NOT DETECTED NOT DETECTED Final   Parainfluenza Virus  3 NOT DETECTED NOT DETECTED Final   Parainfluenza Virus 4 NOT DETECTED NOT DETECTED Final   Respiratory Syncytial Virus NOT DETECTED NOT DETECTED Final   Bordetella pertussis NOT DETECTED NOT DETECTED Final   Chlamydophila pneumoniae NOT DETECTED NOT DETECTED Final   Mycoplasma pneumoniae NOT DETECTED NOT DETECTED Final    Comment: Performed at [facility], [place] [address], [place], [place] [address]  Culture, expectorated sputum-assessment     Status: None   Collection Time: 03/12/17 12:44 PM  Result Value Ref Range Status   Specimen Description SPUTUM  Final   Special Requests NONE  Final   Sputum evaluation   Final    Sputum specimen not acceptable for testing.  Please recollect.   Results Called to: T. FAIRING, RN AT [place] ON 03/12/17 BY C. JESSUP, MLT. Performed at [facility], [place] [address], [place],  [address]    Report Status 03/12/2017 FINAL  Final    Michel Bickers, MD [place] [facility] [phone] pager   [phone] cell 03/14/2017, 2:22 PM

## 2017-03-14 NOTE — Consult Note (Signed)
[facility] Consult  Referring Provider: Mariel Aloe, MD Primary Care Physician:  Leeroy Cha, MD Primary Gastroenterologist: Dr.Outlaw  Reason for Consultation:  Rectal bleeding  HPI: James Wall is a 52 y.o. male was admitted on 03/08/2017 with fever, chills, nausea, vomiting and left upper quadrant abdominal pain, admitted with pneumonia. Patient had rectal bleeding yesterday and the day before which he describes as bright red in color and small or minimal in amount. Patient has a bowel movement every day to every 2-3 days, stools are formed to hard, does not take any laxatives, denies black tarry stools. Last colonoscopy was 2013, had tubular adenomas removed, due for colonoscopy in 2018.  Also has history of cryptogenic cirrhosis(no alcohol use, negative serology for hepatitis B and hepatitis C, ASMA and AMA negative, unremarkable ferritin). No esophageal varices or portal hypertensive gastropathy on prior endoscopies. History of paracentesis.  Patient has been worked up as an outpatient for nausea, vomiting and ongoing abdominal pain. Prior workup with gastric emptying scan and other imaging have been negative.   Past Medical History:  Diagnosis Date  . Anemia   . Anxiety   . Arthritis    GOUT - pt not sure if this is true  . AV fistula occlusion ([place]) 10/2015  . Cirrhosis, nonalcoholic ([place])   . COPD (chronic obstructive pulmonary disease) ([place])   . Depression   . ESRD on hemodialysis [facility])    Started HD Jan 2009.  ESRD was due to HTN.  Dx'd with HTN in hospital 1996 according to pt, they had to keep him so he could get Medicaid to afford the BP medications.  First saw a nephrologist and started HD in the same year 2009.  Gets HD at [facility] on a MWF schedule.  Does not have DM. He had a left RC AVF that never functioned, a left upper arm AVF that worked for about 5 years and as of June  . GERD (gastroesophageal reflux disease)   . Hypertension    . Pneumonia 03/2017  . Renal insufficiency   . Secondary hyperparathyroidism ([place])   . Sepsis ([place]) 02/2013   from AVF , treated with Vancomycin.  Marland Kitchen Shortness of breath    With exertion  . Sleep apnea    no  longer using cpap    Past Surgical History:  Procedure Laterality Date  . AV FISTULA PLACEMENT  2009   Left lower arm AVF  . AV FISTULA PLACEMENT Right 02/22/2013   Procedure:  CREATION  OF BRACHIAL CEPHALIC FISTULA RIGHT ARM;  Surgeon: Elam Dutch, MD;  Location: [place];  Service: Vascular;  Laterality: Right;  . AV FISTULA PLACEMENT Left 08/10/2014   Procedure: BASILIC VEIN TRANSPOSITION  ARTERIOVENOUS (AV) FISTULA CREATION LEFT UPPER ARM;  Surgeon: Mal Misty, MD;  Location: [place];  Service: Vascular;  Laterality: Left;  . COLONOSCOPY    . ESOPHAGOGASTRODUODENOSCOPY (EGD) WITH PROPOFOL N/A 04/12/2013   Procedure: ESOPHAGOGASTRODUODENOSCOPY (EGD) WITH PROPOFOL;  Surgeon: Arta Silence, MD;  Location: [facility];  Service: Endoscopy;  Laterality: N/A;  . INSERTION OF DIALYSIS CATHETER N/A 12/23/2012   Procedure: INSERTION OF DIALYSIS CATHETER; ULTRASOUND GUIDED;  Surgeon: Angelia Mould, MD;  Location: [facility] OR;  Service: Vascular;  Laterality: N/A;  . INSERTION OF DIALYSIS CATHETER  10/22/2015   Right IJ non-tunneled HD catheter  . IR GENERIC HISTORICAL  10/22/2015   IR US GUIDE VASC ACCESS RIGHT 10/22/2015 [facility]  . IR GENERIC HISTORICAL  10/22/2015  IR FLUORO GUIDE CV LINE RIGHT 10/22/2015 [facility]  . IR GENERIC HISTORICAL  10/23/2015   IR FLUORO GUIDE CV LINE RIGHT 10/23/2015 Marybelle Killings, MD [facility]  . LEFT HEART CATHETERIZATION WITH CORONARY ANGIOGRAM N/A 07/13/2013   Procedure: LEFT HEART CATHETERIZATION WITH CORONARY ANGIOGRAM;  Surgeon: Jettie Booze, MD;  Location: [facility] CATH LAB;  Service: Cardiovascular;  Laterality: N/A;  . LIGATION OF ARTERIOVENOUS  FISTULA Left 12/22/2012   Procedure: LIGATION OF ARTERIOVENOUS  FISTULA;EXCISION OF LARGE  ANEURYSMS;;  Surgeon: Elam Dutch, MD;  Location: [place];  Service: Vascular;  Laterality: Left;    Prior to Admission medications   Medication Sig Start Date End Date Taking? Authorizing Provider  acetaminophen (TYLENOL) 650 MG CR tablet Take 650-2,600 mg by mouth daily as needed for pain.   Yes [provider]  cloNIDine (CATAPRES - DOSED IN MG/24 HR) 0.3 mg/24hr Place 0.3 mg onto the skin every Sunday. Saturday.   Yes [provider]  diphenhydramine-acetaminophen (TYLENOL PM) 25-500 MG TABS tablet Take 3 tablets by mouth at bedtime as needed (sleep).   Yes [provider]  hydrOXYzine (ATARAX/VISTARIL) 25 MG tablet Take 25 mg by mouth every 8 (eight) hours as needed for itching.   Yes [provider]  omeprazole (PRILOSEC) 20 MG capsule Take 20 mg by mouth daily as needed (acid reflux).   Yes [provider]  sevelamer carbonate (RENVELA) 800 MG tablet Take 3,200-4,800 mg by mouth See admin instructions. Take 4-6 tablets (3200-4800 mg) by mouth one to two times daily with meals   Yes [provider]  tamsulosin (FLOMAX) 0.4 MG CAPS capsule Take 0.4 mg by mouth every other day.  08/01/14  Yes [provider]  atorvastatin (LIPITOR) 20 MG tablet Take 1 tablet (20 mg total) by mouth daily. Patient not taking: Reported on 03/08/2017 06/21/13   Dorothy Spark, MD    Current Facility-Administered Medications  Medication Dose Route Frequency Provider Last Rate Last Dose  . acetaminophen (TYLENOL) tablet 650 mg  650 mg Oral Q4H PRN Modena Jansky, MD   650 mg at 03/14/17 0948  . albuterol (PROVENTIL) (2.5 MG/3ML) 0.083% nebulizer solution 10 mg  10 mg Nebulization Once Elveria Rising, MD      . amLODipine (NORVASC) tablet 5 mg  5 mg Oral Daily Mariel Aloe, MD   5 mg at 03/14/17 1256  . Ampicillin-Sulbactam (UNASYN) 3 g in sodium chloride 0.9 % 100 mL IVPB  3 g Intravenous Q24H Mariel Aloe, MD   Stopped at 03/13/17 1430   . cinacalcet (SENSIPAR) tablet 60 mg  60 mg Oral Q M,W,F-HD Fuller Plan A, MD   60 mg at 03/09/17 1333  . cloNIDine (CATAPRES - Dosed in mg/24 hr) patch 0.3 mg  0.3 mg Transdermal Q Sat Smith, Rondell A, MD   0.3 mg at 03/09/17 1807  . diphenhydrAMINE (BENADRYL) capsule 50 mg  50 mg Oral QHS PRN Fuller Plan A, MD      . gi cocktail (Maalox,Lidocaine,Donnatal)  30 mL Oral BID PRN Fuller Plan A, MD      . guaiFENesin (MUCINEX) 12 hr tablet 1,200 mg  1,200 mg Oral BID Raiford Noble Grygla, DO   1,200 mg at 03/12/17 1033  . hydrALAZINE (APRESOLINE) injection 10 mg  10 mg Intravenous Q4H PRN Fuller Plan A, MD   10 mg at 03/11/17 2147  . HYDROmorphone (DILAUDID) injection 0.5 mg  0.5 mg Intravenous Q3H PRN Tamala Julian, Rondell A,  MD   0.5 mg at 03/14/17 0941  . hydrOXYzine (ATARAX/VISTARIL) tablet 25 mg  25 mg Oral Q8H PRN Smith, Rondell A, MD      . ipratropium-albuterol (DUONEB) 0.5-2.5 (3) MG/3ML nebulizer solution 3 mL  3 mL Nebulization Q6H PRN Fuller Plan A, MD   3 mL at 03/14/17 1117  . ondansetron (ZOFRAN) tablet 4 mg  4 mg Oral Q6H PRN Fuller Plan A, MD       Or  . ondansetron (ZOFRAN) injection 4 mg  4 mg Intravenous Q6H PRN Fuller Plan A, MD   4 mg at 03/12/17 2150  . oxyCODONE (Oxy IR/ROXICODONE) immediate release tablet 5 mg  5 mg Oral Q6H PRN Hongalgi, Anand D, MD      . pantoprazole (PROTONIX) EC tablet 80 mg  80 mg Oral Daily Tamala Julian, Rondell A, MD   80 mg at 03/14/17 0935  . sevelamer carbonate (RENVELA) tablet 3,200 mg  3,200 mg Oral TID WC Mariel Aloe, MD   3,200 mg at 03/14/17 1105  . tamsulosin (FLOMAX) capsule 0.4 mg  0.4 mg Oral QODAY Smith, Rondell A, MD   0.4 mg at 03/13/17 1134    Allergies as of 03/08/2017 - Review Complete 03/08/2017  Allergen Reaction Noted  . Aspirin Other (See Comments) 04/06/2008    Family History  Problem Relation Age of Onset  . Hypertension Mother   . Cerebrovascular Accident Father   . Hypertension Father   . Congestive  Heart Failure Brother   . Asthma Brother     Social History   Socioeconomic History  . Marital status: Single    Spouse name: Not on file  . Number of children: 0  . Years of education: 12th  . Highest education level: Not on file  Social Needs  . Financial resource strain: Not on file  . Food insecurity - worry: Not on file  . Food insecurity - inability: Not on file  . Transportation needs - medical: Not on file  . Transportation needs - non-medical: Not on file  Occupational History  . Occupation: n/a  Tobacco Use  . Smoking status: Former Smoker    Packs/day: 0.50    Years: 16.00    Pack years: 8.00    Types: Cigarettes    Last attempt to quit: 03/03/2017    Years since quitting: 0.0  . Smokeless tobacco: Never Used  Substance and Sexual Activity  . Alcohol use: Yes    Alcohol/week: 0.0 oz    Comment: occassional  . Drug use: No  . Sexual activity: Not on file  Other Topics Concern  . Not on file  Social History Narrative  . Not on file    Review of Systems: Positive for: GI: Described in detail in HPI.    Gen:  fever, chills, rigors, fatigue, weakness, malaise,Denies any night sweats, anorexia, involuntary weight loss, and sleep disorder CV: Denies chest pain, angina, palpitations, syncope, orthopnea, PND, peripheral edema, and claudication. Resp: Denies dyspnea, cough, sputum, wheezing, coughing up blood. GU : Denies urinary burning, blood in urine, urinary frequency, urinary hesitancy, nocturnal urination, and urinary incontinence. MS: Denies joint pain or swelling.  Denies muscle weakness, cramps, atrophy.  Derm: Denies rash, itching, oral ulcerations, hives, unhealing ulcers.  Psych:  depression, anxiety, Denies memory loss, suicidal ideation, hallucinations,  and confusion. Heme: Rectal bleeding, Denies bruising enlarged lymph nodes. Neuro:  Denies any headaches, dizziness, paresthesias. Endo:  Denies any problems with DM, thyroid, adrenal  function.  Physical Exam:  Vital signs in last 24 hours: Temp:  [98.6 F (37 C)-102.9 F (39.4 C)] 98.6 F (37 C) (02/09 1136) Pulse Rate:  [85-101] 101 (02/09 1223) Resp:  [17-18] 18 (02/09 1223) BP: (136-176)/(81-107) 170/100 (02/09 1234) SpO2:  [94 %-99 %] 99 % (02/09 1223) Weight:  [89.9 kg (198 lb 4.8 oz)] 89.9 kg (198 lb 4.8 oz) (02/09 0531) Last BM Date: 03/13/17  General:   Alert,  Well-developed, overweight, pleasant and cooperative in NAD Head:  Normocephalic and atraumatic. Eyes:  Sclera clear, no icterus.   Mild pallor Ears:  Normal auditory acuity. Nose:  No deformity, discharge,  or lesions. Mouth:  No deformity or lesions.  Oropharynx pink & moist. Neck:  Supple; no masses or thyromegaly. Lungs:  Dialysis catheter on right chest ,Clear throughout to auscultation.   No wheezes, crackles, or rhonchi. No acute distress. Heart:  Regular rate and rhythm; no murmurs, clicks, rubs,  or gallops. Extremities:  Without clubbing or edema. Neurologic:  Alert and  oriented x4;  grossly normal neurologically. Skin:  Intact without significant lesions or rashes. Psych:  Alert and cooperative. Normal mood and affect. Abdomen:  Soft, mild left upper quadrant tenderness and nondistended. No masses, hepatosplenomegaly or hernias noted. Normal bowel sounds, without guarding, and without rebound.         Lab Results: Recent Labs    03/12/17 0452 03/13/17 0745 03/14/17 0417  WBC 6.5 6.1 7.2  HGB 8.3* 7.6* 8.0*  HCT 25.4* 23.3* 24.7*  PLT 171 185 199   BMET Recent Labs    03/12/17 0452 03/13/17 0745 03/14/17 0417  NA 137 136 137  K 4.7 4.5 4.4  CL 95* 95* 97*  CO2 25 23 25   GLUCOSE 93 101* 94  BUN 36* 46* 29*  CREATININE 12.55* 15.05* 10.95*  CALCIUM 7.9* 8.1* 7.8*   LFT Recent Labs    03/13/17 0745  ALBUMIN 2.5*   PT/INR No results for input(s): LABPROT, INR in the last 72 hours.  Studies/Results: Dg Orthopantogram  Result Date: 03/12/2017 CLINICAL DATA:   Right tooth pain and possible tooth abscess for the past 2 weeks. Treated with antibiotics for the past week. EXAM: ORTHOPANTOGRAM/PANORAMIC COMPARISON:  None. FINDINGS: Surgically absent right lower 3rd molar. There is a 10 mm rounded lucency beneath a metallic dental filling in the right lower 1st molar. No periapical lucencies are seen. IMPRESSION: Probable large cavity beneath a metallic dental filling in the right lower 1st molar. No associated periapical abscess seen. Electronically Signed   By: Claudie Revering M.D.   On: 03/12/2017 17:19   US Abdomen Complete  Result Date: 03/14/2017 CLINICAL DATA:  Cirrhosis EXAM: ABDOMEN ULTRASOUND COMPLETE COMPARISON:  CT 03/08/2017 FINDINGS: Gallbladder: No gallstones or wall thickening visualized. No sonographic Murphy sign noted by sonographer. Common bile duct: Diameter: Normal caliber, 4 mm Liver: Heterogeneous echotexture. No focal mass. Portal vein is patent on color Doppler imaging with normal direction of blood flow towards the liver. IVC: No abnormality visualized. Pancreas: Visualized portion unremarkable. Spleen: Size and appearance within normal limits. Right Kidney: Length: 9.6 cm. Increased echotexture and cortical thinning. Multiple cysts. No hydronephrosis. Left Kidney: Length: 10.3 cm. Increased echotexture and cortical thinning. Multiple cysts. No hydronephrosis. Abdominal aorta: No aneurysm visualized. Other findings: No ascites visualized. IMPRESSION: Heterogeneous echotexture throughout the liver. No focal hepatic abnormality. Increased echotexture and cortical thinning in the kidneys bilaterally compatible with chronic medical renal disease. Multiple renal cysts. Electronically Signed   By: Rolm Baptise M.D.  On: 03/14/2017 09:23   Dg Chest Port 1 View  Result Date: 03/13/2017 CLINICAL DATA:  Follow-up pulmonary edema and/or pneumonia. End-stage renal disease on hemodialysis. EXAM: PORTABLE CHEST 1 VIEW COMPARISON:  03/12/2017, 03/08/2017 and  earlier, including CT chest 03/11/2017 and earlier. FINDINGS: Cardiac silhouette moderately enlarged, unchanged. Persistent mild ground-glass airspace opacities in the right lower lobe and to a lesser degree the right upper lobe, with gradual improvement over the past 2 days. Mild pulmonary venous hypertension without overt edema currently. No new pulmonary parenchymal abnormalities. No visible pleural effusions. Right jugular dialysis catheter tip projects at or near the cavoatrial junction, unchanged. IMPRESSION: 1. Gradually improving ground-glass airspace disease in the right lung over the past 2 days, indicating improving pneumonia. 2. No new abnormalities. Electronically Signed   By: Evangeline Dakin M.D.   On: 03/13/2017 17:11    Impression: 1. Painless rectal bleeding(hemoglobin stable 8.9/ 8.9/ 8.3/ 7.6/ 8), has not needed blood transfusions. Macrocytic anemia. As per Dr. Lonny Prude, rectal exam did not reveal frank bleeding, external hemorrhoids were noted.  2. End-stage renal disease on hemodialysis 3. Lobar pneumonia, pro-calcitonin over 150  4. Cryptogenic cirrhosis, patent portal vein, normal sized spleen, no ascites noted, INR not available for calculation of MELD  5. Nausea and vomiting-likely multifactorial: End-stage renal disease/use of narcotics/constipation  Plan: Discussed with patient regarding need for diagnostic colonoscopy.  He was noted to have tubular adenoma in 2013 and is due for a colonoscopy. Patient states he will not be able to tolerate colonic prep currently and wants to schedule colonoscopy as an outpatient. Differential diagnosis includes: Hemorrhoids/AVMs/bleeding polyps.  Recommend use of laxatives or bulk forming laxatives (miralax/metamucil on a regular basis). Outpatient colonoscopy. H&H monitoring and transfusion as needed. GI will sign off, please recall as needed    LOS: 5 days   Ronnette Juniper, M.D.  03/14/2017, 1:50 PM  Pager [phone] If no  answer or after 5 PM call [phone]

## 2017-03-14 NOTE — Progress Notes (Signed)
[facility] Progress Note  Subjective: still spiking temps , all bcx's neg  Vitals:   03/14/17 0008 03/14/17 0531 03/14/17 0900 03/14/17 1136  BP:  (!) 155/96 (!) 176/94   Pulse:  88 94   Resp:  18    Temp: (!) 102.9 F (39.4 C) 99.1 F (37.3 C) (!) 100.4 F (38 C) 98.6 F (37 C)  TempSrc: Oral Oral Oral Oral  SpO2:  97% 97%   Weight:  89.9 kg (198 lb 4.8 oz)    Height:        Inpatient medications: . albuterol  10 mg Nebulization Once  . cinacalcet  60 mg Oral Q M,W,F-HD  . cloNIDine  0.3 mg Transdermal Q Sat  . guaiFENesin  1,200 mg Oral BID  . pantoprazole  80 mg Oral Daily  . sevelamer carbonate  3,200 mg Oral TID WC  . tamsulosin  0.4 mg Oral QODAY   . ampicillin-sulbactam (UNASYN) IV Stopped (03/13/17 1430)   acetaminophen, diphenhydrAMINE **AND** [DISCONTINUED] acetaminophen, gi cocktail, hydrALAZINE, HYDROmorphone (DILAUDID) injection, hydrOXYzine, ipratropium-albuterol, ondansetron **OR** ondansetron (ZOFRAN) IV, oxyCODONE  Exam: Alert, no distress NO jvd Chest clear bilat RRR Abd soft ntnd Ext no wounds or edema NF, ox3  Dialysis: [facility] 3.5h   90kg   2/2 bath   TDC (exchanged 1/31 CKV low BFR)  P4   Hep 4000 + 1K mid VDRA: C3 3.70mg qTx PARSABIV: 529mqTx EPO: mircera 60 q2wk, last given 1/30 Home BP: clonidine patch 0.3 weekly       Impression: 1  Fevers - persistent, unclear cause, resp PCR and blood cx's neg.  Yest CXR showed clearing IS infiltrates.  Vanc/ Cefepime stopped and started on UNasyn per ID.  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5*   < > 8.1* 7.9* 8.1* 7.8*  PHOS 4.7*  --  3.7  --  4.0  --    < > = values in this interval not displayed.   Recent Labs  Lab 03/08/17 2145 03/09/17 0800 03/11/17 0542 03/13/17 0745  AST 18  --  15  --   ALT 9*  --  10*  --   ALKPHOS 49  --  40  --   BILITOT 0.8  --  1.1  --   PROT 6.9  --  6.2*  --   ALBUMIN 3.4* 3.0* 2.8* 2.5*   Recent Labs  Lab 03/08/17 1803 03/09/17 0800  03/11/17 0542 03/12/17 0452 03/13/17 0745 03/14/17 0417  WBC 9.2 11.2*   < > 8.6 6.5 6.1 7.2  NEUTROABS 7.3 9.4*  --  5.7  --   --   --   HGB 10.4* 9.8*   < > 8.9* 8.3* 7.6* 8.0*  HCT 31.4* 29.8*   < > 27.0* 25.4* 23.3* 24.7*  MCV 100.6* 101.4*   < > 99.6 100.4* 99.6 101.2*  PLT 219 210   < > 187 171 185 199   < > = values in this interval not displayed.   Iron/TIBC/Ferritin/ %Sat    Component Value Date/Time   IRON 58 07/10/2013 1500   TIBC 164 (L) 07/10/2013 1500   FERRITIN 1,060 (H) 07/10/2013 1500   IRONPCTSAT 35 07/10/2013 1500

## 2017-03-14 NOTE — Progress Notes (Signed)
PROGRESS NOTE    James Wall  TRV:[id number] DOB: 18-Jun-1965 DOA: 03/08/2017 PCP: Leeroy Cha, MD   Brief Narrative: James Wall is a 52 y.o. male with PMH of ESRD on MWF HD, HTN, CAD, COPD, chronic intermittent nausea and vomiting, cirrhosis, presented to ED with 1-2 days history of fevers, chills, nausea, vomiting, left sided upper abdominal pain/chest pain worse with coughing. Some reported blood in stool. CT abdomen and pelvis showed findings suggestive of pneumonia, no acute abdominal findings. Admitted for sepsis, possibly due to pneumonia but less likely  HD access related infection given recent catheter change. Nephrology consulted and patient getting dialyzed. Still with fevers on vancomycin and cefepime. ID consulted and transitioned to Unasyn.   Assessment & Plan:   Principal Problem:   Sepsis due to pneumonia [facility]) Active Problems:   GERD (gastroesophageal reflux disease)   Anemia of renal disease   ESRD on dialysis ([place])   Hyperkalemia   Hypertensive urgency   Tooth abscess   Lobar pneumonia, unspecified organism (HCC)   Cirrhosis (HCC)   FUO (fever of unknown origin)   LLQ pain   Sepsis Secondary to HCAP. Blood culture/HD cath no growth to date.  HCAP Lobar pneumonia Patient still spiking high fevers. WBC wnl. CT chest significant for Right upper lobe consolidation in addition to bilateral basilar infiltrates. Legionella negative. -ID consulted. Transitioned to Unasyn -HIV pending  Fevers Likely secondary to pneumonia, but persisting. No other sources of infection. History of ascites which was not seen on CT abdomen. Abdominal ultrasound unremarkable for etiology.  Hyperkalemia Manage with HD per nephrology  ESRD on MWF HD -Nephrology follow-up  Hypertensive urgency Poorly contorlled -Continue clonidine -Will start amlodipine -Nephrology recommendations  Abdominal pain Likely secondary to pneumonia. CT unremarkable for  abdominal etiology. No evidence of pancreatitis. Returned 2/8. History of GERD but pain is more laterally displaced. Reproducible. -Continue analgesics  Nausea and vomiting Acute on chronic. Tolerating oral intake. Resolved.  Anemia of chronic disease Secondary to CKD. Hemoglobin dropped. Possible factor of blood loss anemia. External hemorrhoid seen on exam. -GI consulted  Bright red blood per rectum Acute blood loss anemia 2gm drop from admission. Stable currently. Has had a colonoscopy in the past with [facility]. No recurrent episodes overnight. Last episode during the day on 2/8. -Trend CBC -GI consulted  GERD -Continue PPI  Hemorrhagic/nonhemorrhagic renal cysts Seen on CT scan incidentally.  Non-alcoholic cirrhosis Stable.   DVT prophylaxis: SCDs Code Status: Full code Family Communication: None at bedside Disposition Plan: Discharge when medically stable   Consultants:   Nephrology  Procedures:   Hemodialysis  Antimicrobials:  Vancomycin (2/4>>2/7)  Cefepime (2/4>>2/7)   Subjective: LUQ pain returned this morning. Cough is non-productive. Febrile.  Objective: Vitals:   03/14/17 0900 03/14/17 1136 03/14/17 1223 03/14/17 1234  BP: (!) 176/94  (!) 136/107 (!) 170/100  Pulse: 94  (!) 101   Resp:   18   Temp: (!) 100.4 F (38 C) 98.6 F (37 C)    TempSrc: Oral Oral    SpO2: 97%  99%   Weight:      Height:        Intake/Output Summary (Last 24 hours) at 03/14/2017 1252 Last data filed at 03/14/2017 0700 Gross per 24 hour  Intake 520 ml  Output 0 ml  Net 520 ml   Filed Weights   03/13/17 0730 03/13/17 1040 03/14/17 0531  Weight: 93.4 kg (205 lb 14.6 oz) 90.2 kg (198 lb 13.7 oz) 89.9 kg (  198 lb 4.8 oz)    Examination:  General exam: Appears calm and comfortable Respiratory system: Clear to auscultation bilaterally. Unlabored work of breathing. No wheezing or rales. Cardiovascular system: Regular rate and rhythm. Normal S1 and S2. No heart  murmurs present. No extra heart sounds Gastrointestinal system: Abdomen is nondistended, soft and tender in LUQ. Normal bowel sounds heard. DRE exam performed 2/8 Central nervous system: Alert and oriented. No focal neurological deficits. Extremities: No edema. No calf tenderness Skin: No cyanosis. No rashes Psychiatry: Judgement and insight appear normal. Mood & affect appropriate.     Data Reviewed: I have personally reviewed following labs and imaging studies  CBC: Recent Labs  Lab 03/08/17 1803 03/09/17 0800 03/10/17 0450 03/11/17 0542 03/12/17 0452 03/13/17 0745 03/14/17 0417  WBC 9.2 11.2* 9.7 8.6 6.5 6.1 7.2  NEUTROABS 7.3 9.4*  --  5.7  --   --   --   HGB 10.4* 9.8* 8.9* 8.9* 8.3* 7.6* 8.0*  HCT 31.4* 29.8* 26.7* 27.0* 25.4* 23.3* 24.7*  MCV 100.6* 101.4* 101.5* 99.6 100.4* 99.6 101.2*  PLT 219 210 180 187 171 185 008   Basic Metabolic Panel: Recent Labs  Lab 03/09/17 0800 03/09/17 1317 03/11/17 0542 03/12/17 0452 03/13/17 0745 03/14/17 0417  NA 134* 133* 135 137 136 137  K 5.8* 4.9 5.6* 4.7 4.5 4.4  CL 92* 94* 93* 95* 95* 97*  CO2 18* 19* 20* 25 23 25   GLUCOSE 78 80 87 93 101* 94  BUN 74* 54* 75* 36* 46* 29*  CREATININE 20.58* 15.97* 20.75* 12.55* 15.05* 10.95*  CALCIUM 8.5* 8.2* 8.1* 7.9* 8.1* 7.8*  MG  --   --  2.0  --   --   --   PHOS 4.7*  --  3.7  --  4.0  --    GFR: Estimated Creatinine Clearance: 8.1 mL/min (A) (by C-G formula based on SCr of 10.95 mg/dL (H)). Liver Function Tests: Recent Labs  Lab 03/08/17 2145 03/09/17 0800 03/11/17 0542 03/13/17 0745  AST 18  --  15  --   ALT 9*  --  10*  --   ALKPHOS 49  --  40  --   BILITOT 0.8  --  1.1  --   PROT 6.9  --  6.2*  --   ALBUMIN 3.4* 3.0* 2.8* 2.5*   No results for input(s): LIPASE, AMYLASE in the last 168 hours. No results for input(s): AMMONIA in the last 168 hours. Coagulation Profile: No results for input(s): INR, PROTIME in the last 168 hours. Cardiac Enzymes: No results for  input(s): CKTOTAL, CKMB, CKMBINDEX, TROPONINI in the last 168 hours. BNP (last 3 results) No results for input(s): PROBNP in the last 8760 hours. HbA1C: No results for input(s): HGBA1C in the last 72 hours. CBG: No results for input(s): GLUCAP in the last 168 hours. Lipid Profile: No results for input(s): CHOL, HDL, LDLCALC, TRIG, CHOLHDL, LDLDIRECT in the last 72 hours. Thyroid Function Tests: No results for input(s): TSH, T4TOTAL, FREET4, T3FREE, THYROIDAB in the last 72 hours. Anemia Panel: No results for input(s): VITAMINB12, FOLATE, FERRITIN, TIBC, IRON, RETICCTPCT in the last 72 hours. Sepsis Labs: Recent Labs  Lab 03/08/17 1817 03/08/17 2152 03/09/17 0800 03/12/17 0735 03/13/17 0800  PROCALCITON  --   --  17.38 >150.00 >150.00  LATICACIDVEN 1.06 2.0*  --   --   --     Recent Results (from the past 240 hour(s))  Blood culture (routine x 2)  Status: None   Collection Time: 03/08/17  6:03 PM  Result Value Ref Range Status   Specimen Description BLOOD LEFT ARM  Final   Special Requests IN PEDIATRIC BOTTLE Blood Culture adequate volume  Final   Culture   Final    NO GROWTH 5 DAYS Performed at [facility], [address] [place], [place] [address]    Report Status 03/13/2017 FINAL  Final  MRSA PCR Screening     Status: None   Collection Time: 03/09/17  6:16 AM  Result Value Ref Range Status   MRSA by PCR NEGATIVE NEGATIVE Final    Comment:        The GeneXpert MRSA Assay (FDA approved for NASAL specimens only), is one component of a comprehensive MRSA colonization surveillance program. It is not intended to diagnose MRSA infection nor to guide or monitor treatment for MRSA infections. Performed at [facility], [place] [address], [place], [place] [address]   Culture, blood (single)     Status: None   Collection Time: 03/09/17 10:45 AM  Result Value Ref Range Status   Specimen Description BLOOD HEMODIALYSIS CATHETER  Final   Special Requests    Final    BOTTLES DRAWN AEROBIC AND ANAEROBIC Blood Culture results may not be optimal due to an excessive volume of blood received in culture bottles   Culture   Final    NO GROWTH 5 DAYS Performed at [facility], [place] [address], [place], [place] [address]    Report Status 03/14/2017 FINAL  Final  Respiratory Panel by PCR     Status: None   Collection Time: 03/10/17  1:12 PM  Result Value Ref Range Status   Adenovirus NOT DETECTED NOT DETECTED Final   Coronavirus 229E NOT DETECTED NOT DETECTED Final   Coronavirus HKU1 NOT DETECTED NOT DETECTED Final   Coronavirus NL63 NOT DETECTED NOT DETECTED Final   Coronavirus OC43 NOT DETECTED NOT DETECTED Final   Metapneumovirus NOT DETECTED NOT DETECTED Final   Rhinovirus / Enterovirus NOT DETECTED NOT DETECTED Final   Influenza A NOT DETECTED NOT DETECTED Final   Influenza B NOT DETECTED NOT DETECTED Final   Parainfluenza Virus 1 NOT DETECTED NOT DETECTED Final   Parainfluenza Virus 2 NOT DETECTED NOT DETECTED Final   Parainfluenza Virus 3 NOT DETECTED NOT DETECTED Final   Parainfluenza Virus 4 NOT DETECTED NOT DETECTED Final   Respiratory Syncytial Virus NOT DETECTED NOT DETECTED Final   Bordetella pertussis NOT DETECTED NOT DETECTED Final   Chlamydophila pneumoniae NOT DETECTED NOT DETECTED Final   Mycoplasma pneumoniae NOT DETECTED NOT DETECTED Final    Comment: Performed at [facility], [place] [address], [place], [place] [address]  Culture, expectorated sputum-assessment     Status: None   Collection Time: 03/12/17 12:44 PM  Result Value Ref Range Status   Specimen Description SPUTUM  Final   Special Requests NONE  Final   Sputum evaluation   Final    Sputum specimen not acceptable for testing.  Please recollect.   Results Called to: T. FAIRING, RN AT [place] ON 03/12/17 BY C. JESSUP, MLT. Performed at [facility], [place] [address], [place], [place] [address]    Report Status 03/12/2017 FINAL  Final          Radiology Studies: Dg Orthopantogram  Result Date: 03/12/2017 CLINICAL DATA:  Right tooth pain and possible tooth abscess for the past 2 weeks. Treated with antibiotics for the past week.  EXAM: ORTHOPANTOGRAM/PANORAMIC COMPARISON:  None. FINDINGS: Surgically absent right lower 3rd molar. There is a 10 mm rounded lucency beneath a metallic dental filling in the right lower 1st molar. No periapical lucencies are seen. IMPRESSION: Probable large cavity beneath a metallic dental filling in the right lower 1st molar. No associated periapical abscess seen. Electronically Signed   By: Claudie Revering M.D.   On: 03/12/2017 17:19   US Abdomen Complete  Result Date: 03/14/2017 CLINICAL DATA:  Cirrhosis EXAM: ABDOMEN ULTRASOUND COMPLETE COMPARISON:  CT 03/08/2017 FINDINGS: Gallbladder: No gallstones or wall thickening visualized. No sonographic Murphy sign noted by sonographer. Common bile duct: Diameter: Normal caliber, 4 mm Liver: Heterogeneous echotexture. No focal mass. Portal vein is patent on color Doppler imaging with normal direction of blood flow towards the liver. IVC: No abnormality visualized. Pancreas: Visualized portion unremarkable. Spleen: Size and appearance within normal limits. Right Kidney: Length: 9.6 cm. Increased echotexture and cortical thinning. Multiple cysts. No hydronephrosis. Left Kidney: Length: 10.3 cm. Increased echotexture and cortical thinning. Multiple cysts. No hydronephrosis. Abdominal aorta: No aneurysm visualized. Other findings: No ascites visualized. IMPRESSION: Heterogeneous echotexture throughout the liver. No focal hepatic abnormality. Increased echotexture and cortical thinning in the kidneys bilaterally compatible with chronic medical renal disease. Multiple renal cysts. Electronically Signed   By: Rolm Baptise M.D.   On: 03/14/2017 09:23   Dg Chest Port 1 View  Result Date: 03/13/2017 CLINICAL DATA:  Follow-up pulmonary edema and/or pneumonia. End-stage renal  disease on hemodialysis. EXAM: PORTABLE CHEST 1 VIEW COMPARISON:  03/12/2017, 03/08/2017 and earlier, including CT chest 03/11/2017 and earlier. FINDINGS: Cardiac silhouette moderately enlarged, unchanged. Persistent mild ground-glass airspace opacities in the right lower lobe and to a lesser degree the right upper lobe, with gradual improvement over the past 2 days. Mild pulmonary venous hypertension without overt edema currently. No new pulmonary parenchymal abnormalities. No visible pleural effusions. Right jugular dialysis catheter tip projects at or near the cavoatrial junction, unchanged. IMPRESSION: 1. Gradually improving ground-glass airspace disease in the right lung over the past 2 days, indicating improving pneumonia. 2. No new abnormalities. Electronically Signed   By: Evangeline Dakin M.D.   On: 03/13/2017 17:11        Scheduled Meds: . albuterol  10 mg Nebulization Once  . amLODipine  5 mg Oral Daily  . cinacalcet  60 mg Oral Q M,W,F-HD  . cloNIDine  0.3 mg Transdermal Q Sat  . guaiFENesin  1,200 mg Oral BID  . pantoprazole  80 mg Oral Daily  . sevelamer carbonate  3,200 mg Oral TID WC  . tamsulosin  0.4 mg Oral QODAY   Continuous Infusions: . ampicillin-sulbactam (UNASYN) IV Stopped (03/13/17 1430)     LOS: 5 days     Cordelia Poche, MD [facility] 03/14/2017, 12:52 PM Pager: [phone]  If 7PM-7AM, please contact night-coverage [email or web address] Password [email or web address] 03/14/2017, 12:52 PM

## 2017-03-15 MED ORDER — DARBEPOETIN ALFA 60 MCG/0.3ML IJ SOSY
60.0000 ug | PREFILLED_SYRINGE | INTRAMUSCULAR | Status: DC
  Filled 2017-03-15: qty 0.3

## 2017-03-15 MED ORDER — CLONIDINE HCL 0.3 MG/24HR TD PTWK
0.3000 mg | MEDICATED_PATCH | TRANSDERMAL | Status: DC
  Administered 2017-03-16: 0.3 mg via TRANSDERMAL
  Filled 2017-03-15: qty 1

## 2017-03-15 NOTE — Progress Notes (Signed)
PROGRESS NOTE    James Wall  ZSW:[id number] DOB: [birth year] DOA: 03/08/2017 PCP: Leeroy Cha, MD   Brief Narrative: James Wall is a 52 y.o. male with PMH of ESRD on MWF HD, HTN, CAD, COPD, chronic intermittent nausea and vomiting, cirrhosis, presented to ED with 1-2 days history of fevers, chills, nausea, vomiting, left sided upper abdominal pain/chest pain worse with coughing. Some reported blood in stool. CT abdomen and pelvis showed findings suggestive of pneumonia, no acute abdominal findings. Admitted for sepsis, possibly due to pneumonia but less likely  HD access related infection given recent catheter change. Nephrology consulted and patient getting dialyzed. Still with fevers on vancomycin and cefepime. ID consulted and transitioned to Unasyn.   Assessment & Plan:   Principal Problem:   FUO (fever of unknown origin) Active Problems:   GERD (gastroesophageal reflux disease)   Anemia of renal disease   HCAP (healthcare-associated pneumonia)   ESRD on dialysis ([place])   Hyperkalemia   Hypertensive urgency   Sepsis due to pneumonia ([place])   Tooth abscess   Lobar pneumonia, unspecified organism ([place])   Cirrhosis ([place])   LLQ pain   Sepsis Secondary to HCAP. Blood culture/HD cath no growth to date.  HCAP Lobar pneumonia Patient still spiking high fevers. WBC wnl. CT chest significant for right upper lobe consolidation in addition to bilateral basilar infiltrates. Legionella negative. HIV negative -ID consulted. Transitioned to Unasyn  Fevers Likely secondary to pneumonia, but persisting. No other sources of infection. History of ascites which was not seen on CT abdomen. Abdominal ultrasound unremarkable for etiology. Curve trending down.  Hyperkalemia Manage with HD per nephrology  ESRD on MWF HD -Nephrology follow-up  Hypertensive urgency Poorly contorlled -Continue clonidine and amlodipine -Continue hydralazine prn -Nephrology  recommendations  Abdominal pain Likely secondary to pneumonia. CT unremarkable for abdominal etiology. No evidence of pancreatitis. Returned 2/8. History of GERD but pain is more laterally displaced. Reproducible. -Continue analgesics  Nausea and vomiting Acute on chronic. Tolerating oral intake. Resolved.  Anemia of chronic disease Secondary to CKD. Hemoglobin dropped. Possible factor of blood loss anemia. External hemorrhoid seen on exam. Stable.  Bright red blood per rectum Acute blood loss anemia 2gm drop from admission. Stable currently. Has had a colonoscopy in the past with [facility]. No recurrent episodes overnight. Last episode during the day on 2/8. stable -Trend CBC -GI recommendations: outpatient colonoscopy  GERD -Continue PPI  Hemorrhagic/nonhemorrhagic renal cysts Seen on CT scan incidentally.  Non-alcoholic cirrhosis Stable.   DVT prophylaxis: SCDs Code Status: Full code Family Communication: None at bedside Disposition Plan: Discharge when medically stable   Consultants:   Nephrology  Procedures:   Hemodialysis  Antimicrobials:  Vancomycin (2/4>>2/7)  Cefepime (2/4>>2/7)  Unasyn (2/7>>   Subjective: LUQ persistent. Febrile overnight.  Objective: Vitals:   03/14/17 2030 03/15/17 0525 03/15/17 0808 03/15/17 1156  BP: (!) 185/93  (!) 162/100 (!) 173/103  Pulse: (!) 111 89 95   Resp: 18 18    Temp: (!) 101 F (38.3 C) 99.9 F (37.7 C) 99.1 F (37.3 C) 98.9 F (37.2 C)  TempSrc: Oral Oral Oral Oral  SpO2: 94% 93% 95%   Weight:  90.9 kg (200 lb 8 oz)    Height:        Intake/Output Summary (Last 24 hours) at 03/15/2017 1311 Last data filed at 03/15/2017 0900 Gross per 24 hour  Intake 480 ml  Output 0 ml  Net 480 ml   [employer]   03/13/17  1040 03/14/17 0531 03/15/17 0525  Weight: 90.2 kg (198 lb 13.7 oz) 89.9 kg (198 lb 4.8 oz) 90.9 kg (200 lb 8 oz)    Examination:  General exam: Appears calm and comfortable Respiratory  system: Clear to auscultation bilaterally. Unlabored work of breathing. No wheezing or rales. Cardiovascular system: Regular rate and rhythm. Normal S1 and S2. No heart murmurs present. No extra heart sounds Gastrointestinal system: Abdomen is nondistended, soft and tender in LUQ. Normal bowel sounds heard. DRE exam performed 2/8 Central nervous system: Alert and oriented. No focal neurological deficits. Extremities: No edema. No calf tenderness Skin: No cyanosis. No rashes Psychiatry: Judgement and insight appear normal. Mood & affect appropriate.     Data Reviewed: I have personally reviewed following labs and imaging studies  CBC: Recent Labs  Lab 03/08/17 1803 03/09/17 0800 03/10/17 0450 03/11/17 0542 03/12/17 0452 03/13/17 0745 03/14/17 0417  WBC 9.2 11.2* 9.7 8.6 6.5 6.1 7.2  NEUTROABS 7.3 9.4*  --  5.7  --   --   --   HGB 10.4* 9.8* 8.9* 8.9* 8.3* 7.6* 8.0*  HCT 31.4* 29.8* 26.7* 27.0* 25.4* 23.3* 24.7*  MCV 100.6* 101.4* 101.5* 99.6 100.4* 99.6 101.2*  PLT 219 210 180 187 171 185 563   Basic Metabolic Panel: Recent Labs  Lab 03/09/17 0800 03/09/17 1317 03/11/17 0542 03/12/17 0452 03/13/17 0745 03/14/17 0417  NA 134* 133* 135 137 136 137  K 5.8* 4.9 5.6* 4.7 4.5 4.4  CL 92* 94* 93* 95* 95* 97*  CO2 18* 19* 20* 25 23 25   GLUCOSE 78 80 87 93 101* 94  BUN 74* 54* 75* 36* 46* 29*  CREATININE 20.58* 15.97* 20.75* 12.55* 15.05* 10.95*  CALCIUM 8.5* 8.2* 8.1* 7.9* 8.1* 7.8*  MG  --   --  2.0  --   --   --   PHOS 4.7*  --  3.7  --  4.0  --    GFR: Estimated Creatinine Clearance: 8.2 mL/min (A) (by C-G formula based on SCr of 10.95 mg/dL (H)). Liver Function Tests: Recent Labs  Lab 03/08/17 2145 03/09/17 0800 03/11/17 0542 03/13/17 0745  AST 18  --  15  --   ALT 9*  --  10*  --   ALKPHOS 49  --  40  --   BILITOT 0.8  --  1.1  --   PROT 6.9  --  6.2*  --   ALBUMIN 3.4* 3.0* 2.8* 2.5*   No results for input(s): LIPASE, AMYLASE in the last 168 hours. No  results for input(s): AMMONIA in the last 168 hours. Coagulation Profile: No results for input(s): INR, PROTIME in the last 168 hours. Cardiac Enzymes: No results for input(s): CKTOTAL, CKMB, CKMBINDEX, TROPONINI in the last 168 hours. BNP (last 3 results) No results for input(s): PROBNP in the last 8760 hours. HbA1C: No results for input(s): HGBA1C in the last 72 hours. CBG: No results for input(s): GLUCAP in the last 168 hours. Lipid Profile: No results for input(s): CHOL, HDL, LDLCALC, TRIG, CHOLHDL, LDLDIRECT in the last 72 hours. Thyroid Function Tests: No results for input(s): TSH, T4TOTAL, FREET4, T3FREE, THYROIDAB in the last 72 hours. Anemia Panel: No results for input(s): VITAMINB12, FOLATE, FERRITIN, TIBC, IRON, RETICCTPCT in the last 72 hours. Sepsis Labs: Recent Labs  Lab 03/08/17 1817 03/08/17 2152 03/09/17 0800 03/12/17 0735 03/13/17 0800  PROCALCITON  --   --  17.38 >150.00 >150.00  LATICACIDVEN 1.06 2.0*  --   --   --  Recent Results (from the past 240 hour(s))  Blood culture (routine x 2)     Status: None   Collection Time: 03/08/17  6:03 PM  Result Value Ref Range Status   Specimen Description BLOOD LEFT ARM  Final   Special Requests IN PEDIATRIC BOTTLE Blood Culture adequate volume  Final   Culture   Final    NO GROWTH 5 DAYS Performed at [facility], [address], [place], [place] [address]    Report Status 03/13/2017 FINAL  Final  MRSA PCR Screening     Status: None   Collection Time: 03/09/17  6:16 AM  Result Value Ref Range Status   MRSA by PCR NEGATIVE NEGATIVE Final    Comment:        The GeneXpert MRSA Assay (FDA approved for NASAL specimens only), is one component of a comprehensive MRSA colonization surveillance program. It is not intended to diagnose MRSA infection nor to guide or monitor treatment for MRSA infections. Performed at [facility], [place] [address] [place], [place] [address]   Culture, blood (single)      Status: None   Collection Time: 03/09/17 10:45 AM  Result Value Ref Range Status   Specimen Description BLOOD HEMODIALYSIS CATHETER  Final   Special Requests   Final    BOTTLES DRAWN AEROBIC AND ANAEROBIC Blood Culture results may not be optimal due to an excessive volume of blood received in culture bottles   Culture   Final    NO GROWTH 5 DAYS Performed at [facility], [place] [address], [place], [place] [address]    Report Status 03/14/2017 FINAL  Final  Respiratory Panel by PCR     Status: None   Collection Time: 03/10/17  1:12 PM  Result Value Ref Range Status   Adenovirus NOT DETECTED NOT DETECTED Final   Coronavirus 229E NOT DETECTED NOT DETECTED Final   Coronavirus HKU1 NOT DETECTED NOT DETECTED Final   Coronavirus NL63 NOT DETECTED NOT DETECTED Final   Coronavirus OC43 NOT DETECTED NOT DETECTED Final   Metapneumovirus NOT DETECTED NOT DETECTED Final   Rhinovirus / Enterovirus NOT DETECTED NOT DETECTED Final   Influenza A NOT DETECTED NOT DETECTED Final   Influenza B NOT DETECTED NOT DETECTED Final   Parainfluenza Virus 1 NOT DETECTED NOT DETECTED Final   Parainfluenza Virus 2 NOT DETECTED NOT DETECTED Final   Parainfluenza Virus 3 NOT DETECTED NOT DETECTED Final   Parainfluenza Virus 4 NOT DETECTED NOT DETECTED Final   Respiratory Syncytial Virus NOT DETECTED NOT DETECTED Final   Bordetella pertussis NOT DETECTED NOT DETECTED Final   Chlamydophila pneumoniae NOT DETECTED NOT DETECTED Final   Mycoplasma pneumoniae NOT DETECTED NOT DETECTED Final    Comment: Performed at [facility], [place] [address], [place], [place] [address]  Culture, expectorated sputum-assessment     Status: None   Collection Time: 03/12/17 12:44 PM  Result Value Ref Range Status   Specimen Description SPUTUM  Final   Special Requests NONE  Final   Sputum evaluation   Final    Sputum specimen not acceptable for testing.  Please recollect.   Results Called to: T. FAIRING, RN AT [facility] ON  03/12/17 BY C. JESSUP, MLT. Performed at [facility], [place] [address] [place],  [address]    Report Status 03/12/2017 FINAL  Final         Radiology Studies: US Abdomen Complete  Result Date: 03/14/2017 CLINICAL DATA:  Cirrhosis  EXAM: ABDOMEN ULTRASOUND COMPLETE COMPARISON:  CT 03/08/2017 FINDINGS: Gallbladder: No gallstones or wall thickening visualized. No sonographic Murphy sign noted by sonographer. Common bile duct: Diameter: Normal caliber, 4 mm Liver: Heterogeneous echotexture. No focal mass. Portal vein is patent on color Doppler imaging with normal direction of blood flow towards the liver. IVC: No abnormality visualized. Pancreas: Visualized portion unremarkable. Spleen: Size and appearance within normal limits. Right Kidney: Length: 9.6 cm. Increased echotexture and cortical thinning. Multiple cysts. No hydronephrosis. Left Kidney: Length: 10.3 cm. Increased echotexture and cortical thinning. Multiple cysts. No hydronephrosis. Abdominal aorta: No aneurysm visualized. Other findings: No ascites visualized. IMPRESSION: Heterogeneous echotexture throughout the liver. No focal hepatic abnormality. Increased echotexture and cortical thinning in the kidneys bilaterally compatible with chronic medical renal disease. Multiple renal cysts. Electronically Signed   By: Rolm Baptise M.D.   On: 03/14/2017 09:23   Dg Chest Port 1 View  Result Date: 03/13/2017 CLINICAL DATA:  Follow-up pulmonary edema and/or pneumonia. End-stage renal disease on hemodialysis. EXAM: PORTABLE CHEST 1 VIEW COMPARISON:  03/12/2017, 03/08/2017 and earlier, including CT chest 03/11/2017 and earlier. FINDINGS: Cardiac silhouette moderately enlarged, unchanged. Persistent mild ground-glass airspace opacities in the right lower lobe and to a lesser degree the right upper lobe, with gradual improvement over the past 2 days. Mild pulmonary venous hypertension without overt edema currently. No new pulmonary parenchymal  abnormalities. No visible pleural effusions. Right jugular dialysis catheter tip projects at or near the cavoatrial junction, unchanged. IMPRESSION: 1. Gradually improving ground-glass airspace disease in the right lung over the past 2 days, indicating improving pneumonia. 2. No new abnormalities. Electronically Signed   By: Evangeline Dakin M.D.   On: 03/13/2017 17:11        Scheduled Meds: . amLODipine  5 mg Oral Daily  . cinacalcet  60 mg Oral Q M,W,F-HD  . cloNIDine  0.3 mg Transdermal Q Sat  . [START ON 03/16/2017] darbepoetin (ARANESP) injection - DIALYSIS  60 mcg Intravenous Q Mon-HD  . guaiFENesin  1,200 mg Oral BID  . pantoprazole  80 mg Oral Daily  . sevelamer carbonate  3,200 mg Oral TID WC  . tamsulosin  0.4 mg Oral QODAY   Continuous Infusions: . ampicillin-sulbactam (UNASYN) IV Stopped (03/14/17 1440)     LOS: 6 days     Cordelia Poche, MD [facility] 03/15/2017, 1:11 PM Pager: [phone]  If 7PM-7AM, please contact night-coverage [email or web address] Password [email or web address] 03/15/2017, 1:11 PM

## 2017-03-15 NOTE — Progress Notes (Signed)
Notified Dr. Lonny Prude of patient's BP being elevated.

## 2017-03-15 NOTE — Progress Notes (Signed)
[facility] Progress Note  Subjective: temps down somewhat last night, no SOB, pain w coughing  Vitals:   03/14/17 1446 03/14/17 2030 03/15/17 0525 03/15/17 0808  BP: (!) 171/94 (!) 185/93  (!) 162/100  Pulse:  (!) 111 89 95  Resp:  18 18   Temp: 99.8 F (37.7 C) (!) 101 F (38.3 C) 99.9 F (37.7 C) 99.1 F (37.3 C)  TempSrc: Oral Oral Oral Oral  SpO2:  94% 93% 95%  Weight:   90.9 kg (200 lb 8 oz)   Height:        Inpatient medications: . amLODipine  5 mg Oral Daily  . cinacalcet  60 mg Oral Q M,W,F-HD  . cloNIDine  0.3 mg Transdermal Q Sat  . guaiFENesin  1,200 mg Oral BID  . pantoprazole  80 mg Oral Daily  . sevelamer carbonate  3,200 mg Oral TID WC  . tamsulosin  0.4 mg Oral QODAY   . ampicillin-sulbactam (UNASYN) IV Stopped (03/14/17 1440)   acetaminophen, diphenhydrAMINE **AND** [DISCONTINUED] acetaminophen, gi cocktail, hydrALAZINE, HYDROmorphone (DILAUDID) injection, hydrOXYzine, ipratropium-albuterol, ondansetron **OR** ondansetron (ZOFRAN) IV, oxyCODONE  Exam: Alert, no distress NO jvd Chest clear bilat RRR Abd soft ntnd Ext no wounds or edema NF, ox3  Dialysis: [facility] 3.5h   90kg   2/2 bath   TDC (exchanged 1/31 CKV low BFR)  P4   Hep 4000 + 1K mid VDRA: C3 3.76mg qTx PARSABIV: 546mqTx EPO: mircera 60 q2wk, last given 1/30 Home BP: clonidine patch 0.3 weekly       Impression: 1  Fevers - persistent, unclear cause, resp PCR and blood cx's neg.  Vanc/ Cefepime stopped and started on UNasyn per ID.  Temps may be improving now. 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 4  HTN BP's on high side, clon patch, norvasc added 5  Anemia ckd, Hb 8; transfuse prn, give darbe 60 on Monday  6  MBD ckd, cont meds 7  Vol - close to dry today   Plan - HD Monday, esa  [facility] pager [phone] 03/15/2017, 10:50 AM   Recent Labs  Lab 03/09/17 0800  03/11/17 0542 03/12/17 0452  03/13/17 0745 03/14/17 0417  NA 134*   < > 135 137 136 137  K 5.8*   < > 5.6* 4.7 4.5 4.4  CL 92*   < > 93* 95* 95* 97*  CO2 18*   < > 20* 25 23 25   GLUCOSE 78   < > 87 93 101* 94  BUN 74*   < > 75* 36* 46* 29*  CREATININE 20.58*   < > 20.75* 12.55* 15.05* 10.95*  CALCIUM 8.5*   < > 8.1* 7.9* 8.1* 7.8*  PHOS 4.7*  --  3.7  --  4.0  --    < > = values in this interval not displayed.   Recent Labs  Lab 03/08/17 2145 03/09/17 0800 03/11/17 0542 03/13/17 0745  AST 18  --  15  --   ALT 9*  --  10*  --   ALKPHOS 49  --  40  --   BILITOT 0.8  --  1.1  --   PROT 6.9  --  6.2*  --   ALBUMIN 3.4* 3.0* 2.8* 2.5*   Recent Labs  Lab 03/08/17 1803 03/09/17 0800  03/11/17 0542 03/12/17 0452 03/13/17 0745 03/14/17 0417  WBC 9.2 11.2*   < >  8.6 6.5 6.1 7.2  NEUTROABS 7.3 9.4*  --  5.7  --   --   --   HGB 10.4* 9.8*   < > 8.9* 8.3* 7.6* 8.0*  HCT 31.4* 29.8*   < > 27.0* 25.4* 23.3* 24.7*  MCV 100.6* 101.4*   < > 99.6 100.4* 99.6 101.2*  PLT 219 210   < > 187 171 185 199   < > = values in this interval not displayed.   Iron/TIBC/Ferritin/ %Sat    Component Value Date/Time   IRON 58 07/10/2013 1500   TIBC 164 (L) 07/10/2013 1500   FERRITIN 1,060 (H) 07/10/2013 1500   IRONPCTSAT 35 07/10/2013 1500

## 2017-03-15 NOTE — Progress Notes (Signed)
Patient ID: James Wall, male   DOB: 11-26-1965, 52 y.o.   MRN: [id number]          [facility]    Date of Admission:  03/08/2017    Total days of antibiotics 8        Day 5 ampicillin sulbactam  His temperature is now trending down.  I suspect that his recent fevers were due to right lower lobe pneumonia which improving.  I would consider stopping ampicillin sulbactam soon.  We will follow with you.         Michel Bickers, MD [facility] [place] [facility] [phone] pager   [phone] cell 03/15/2017, 1:53 PM

## 2017-03-16 DIAGNOSIS — R093 Abnormal sputum: Secondary | ICD-10-CM

## 2017-03-16 DIAGNOSIS — R0600 Dyspnea, unspecified: Secondary | ICD-10-CM

## 2017-03-16 LAB — BASIC METABOLIC PANEL
Anion gap: 20 — ABNORMAL HIGH (ref 5–15)
BUN: 53 mg/dL — ABNORMAL HIGH (ref 6–20)
CHLORIDE: 94 mmol/L — AB (ref 101–111)
CO2: 22 mmol/L (ref 22–32)
CREATININE: 17.59 mg/dL — AB (ref 0.61–1.24)
Calcium: 7.7 mg/dL — ABNORMAL LOW (ref 8.9–10.3)
GFR calc Af Amer: 3 mL/min — ABNORMAL LOW (ref >60)
GFR, EST NON AFRICAN AMERICAN: 3 mL/min — AB (ref >60)
Glucose, Bld: 86 mg/dL (ref 65–99)
Potassium: 4.6 mmol/L (ref 3.5–5.1)
SODIUM: 136 mmol/L (ref 135–145)

## 2017-03-16 LAB — CBC
HEMATOCRIT: 23.6 % — AB (ref 39.0–52.0)
Hemoglobin: 7.8 g/dL — ABNORMAL LOW (ref 13.0–17.0)
MCH: 33.1 pg (ref 26.0–34.0)
MCHC: 33.1 g/dL (ref 30.0–36.0)
MCV: 100 fL (ref 78.0–100.0)
PLATELETS: 296 10*3/uL (ref 150–400)
RBC: 2.36 MIL/uL — ABNORMAL LOW (ref 4.22–5.81)
RDW: 16.6 % — ABNORMAL HIGH (ref 11.5–15.5)
WBC: 5.1 10*3/uL (ref 4.0–10.5)

## 2017-03-16 MED ORDER — POLYETHYLENE GLYCOL 3350 17 G PO PACK
17.0000 g | PACK | Freq: Every day | ORAL | Status: DC
  Filled 2017-03-16: qty 1

## 2017-03-16 NOTE — Progress Notes (Signed)
PROGRESS NOTE    James Wall  AUQ:[id number] DOB: 1965-02-11 DOA: 03/08/2017 PCP: Leeroy Cha, MD   Brief Narrative: James Wall is a 52 y.o. male with PMH of ESRD on MWF HD, HTN, CAD, COPD, chronic intermittent nausea and vomiting, cirrhosis, presented to ED with 1-2 days history of fevers, chills, nausea, vomiting, left sided upper abdominal pain/chest pain worse with coughing. Some reported blood in stool. CT abdomen and pelvis showed findings suggestive of pneumonia, no acute abdominal findings. Admitted for sepsis, possibly due to pneumonia but less likely  HD access related infection given recent catheter change. Nephrology consulted and patient getting dialyzed. Still with fevers on vancomycin and cefepime. ID consulted and transitioned to Unasyn. Fever curve trending down. Still with LUQ pain.   Assessment & Plan:   Principal Problem:   FUO (fever of unknown origin) Active Problems:   GERD (gastroesophageal reflux disease)   Anemia of renal disease   HCAP (healthcare-associated pneumonia)   ESRD on dialysis ([place])   Hyperkalemia   Hypertensive urgency   Sepsis due to pneumonia ([place])   Tooth abscess   Lobar pneumonia, unspecified organism ([place])   Cirrhosis ([place])   LLQ pain   Sepsis Secondary to HCAP. Blood culture/HD cath no growth to date.  HCAP Lobar pneumonia Patient still spiking high fevers. WBC wnl. CT chest significant for right upper lobe consolidation in addition to bilateral basilar infiltrates. Legionella negative. HIV negative -ID consulted. Transitioned to Unasyn; continue  Fevers Likely secondary to pneumonia, but persisting. No other sources of infection. History of ascites which was not seen on CT abdomen. Abdominal ultrasound unremarkable for etiology. Curve trending down. Afebrile over last 24 hours  Hyperkalemia Manage with HD per nephrology  ESRD on MWF HD -Nephrology follow-up  Hypertensive urgency Poorly  contorlled -Continue clonidine and amlodipine -Continue hydralazine prn -Nephrology recommendations  Abdominal pain Likely secondary to pneumonia. CT unremarkable for abdominal etiology. No evidence of pancreatitis. Returned 2/8. History of GERD but pain is more laterally displaced. Reproducible. -Continue analgesics  Nausea and vomiting Acute on chronic. Tolerating oral intake. Resolved.  Anemia of chronic disease Secondary to CKD. Hemoglobin dropped. Possible factor of blood loss anemia. External hemorrhoid seen on exam. Stable.  Bright red blood per rectum Acute blood loss anemia 2gm drop from admission. Stable currently. Has had a colonoscopy in the past with [facility]. No recurrent episodes overnight. Last episode during the day on 2/8. stable -Trend CBC -GI recommendations: outpatient colonoscopy  GERD -Continue PPI  Hemorrhagic/nonhemorrhagic renal cysts Seen on CT scan incidentally.  Non-alcoholic cirrhosis Stable.   DVT prophylaxis: SCDs Code Status: Full code Family Communication: None at bedside Disposition Plan: Discharge when medically stable   Consultants:   Nephrology  Procedures:   Hemodialysis  Antimicrobials:  Vancomycin (2/4>>2/7)  Cefepime (2/4>>2/7)  Unasyn (2/7>>   Subjective: LUQ pain overnight. Coughing is productive. Pain worse with cough.  Objective: Vitals:   03/15/17 1342 03/15/17 1438 03/15/17 1949 03/16/17 0523  BP: (!) 166/95 (!) 157/91 (!) 174/95 (!) 168/98  Pulse: 100 95 98 100  Resp:  18 18 18   Temp:  99.2 F (37.3 C) 99.1 F (37.3 C) 99 F (37.2 C)  TempSrc:  Oral Oral Oral  SpO2:  100% 95% 98%  Weight:    91.6 kg (201 lb 14.4 oz)  Height:        Intake/Output Summary (Last 24 hours) at 03/16/2017 1033 Last data filed at 03/16/2017 0857 Gross per 24 hour  Intake 960  ml  Output 0 ml  Net 960 ml   Filed Weights   03/14/17 0531 03/15/17 0525 03/16/17 0523  Weight: 89.9 kg (198 lb 4.8 oz) 90.9 kg (200 lb 8  oz) 91.6 kg (201 lb 14.4 oz)    Examination:  General exam: Appears calm and comfortable Respiratory system: Unlabored work of breathing. Central nervous system: Alert and oriented. No focal neurological deficits. Skin: No cyanosis. No rashes Psychiatry: Judgement and insight appear normal. Mood & affect appropriate.     Data Reviewed: I have personally reviewed following labs and imaging studies  CBC: Recent Labs  Lab 03/10/17 0450 03/11/17 0542 03/12/17 0452 03/13/17 0745 03/14/17 0417  WBC 9.7 8.6 6.5 6.1 7.2  NEUTROABS  --  5.7  --   --   --   HGB 8.9* 8.9* 8.3* 7.6* 8.0*  HCT 26.7* 27.0* 25.4* 23.3* 24.7*  MCV 101.5* 99.6 100.4* 99.6 101.2*  PLT 180 187 171 185 808   Basic Metabolic Panel: Recent Labs  Lab 03/09/17 1317 03/11/17 0542 03/12/17 0452 03/13/17 0745 03/14/17 0417  NA 133* 135 137 136 137  K 4.9 5.6* 4.7 4.5 4.4  CL 94* 93* 95* 95* 97*  CO2 19* 20* 25 23 25   GLUCOSE 80 87 93 101* 94  BUN 54* 75* 36* 46* 29*  CREATININE 15.97* 20.75* 12.55* 15.05* 10.95*  CALCIUM 8.2* 8.1* 7.9* 8.1* 7.8*  MG  --  2.0  --   --   --   PHOS  --  3.7  --  4.0  --    GFR: Estimated Creatinine Clearance: 8.2 mL/min (A) (by C-G formula based on SCr of 10.95 mg/dL (H)). Liver Function Tests: Recent Labs  Lab 03/11/17 0542 03/13/17 0745  AST 15  --   ALT 10*  --   ALKPHOS 40  --   BILITOT 1.1  --   PROT 6.2*  --   ALBUMIN 2.8* 2.5*   No results for input(s): LIPASE, AMYLASE in the last 168 hours. No results for input(s): AMMONIA in the last 168 hours. Coagulation Profile: No results for input(s): INR, PROTIME in the last 168 hours. Cardiac Enzymes: No results for input(s): CKTOTAL, CKMB, CKMBINDEX, TROPONINI in the last 168 hours. BNP (last 3 results) No results for input(s): PROBNP in the last 8760 hours. HbA1C: No results for input(s): HGBA1C in the last 72 hours. CBG: No results for input(s): GLUCAP in the last 168 hours. Lipid Profile: No results  for input(s): CHOL, HDL, LDLCALC, TRIG, CHOLHDL, LDLDIRECT in the last 72 hours. Thyroid Function Tests: No results for input(s): TSH, T4TOTAL, FREET4, T3FREE, THYROIDAB in the last 72 hours. Anemia Panel: No results for input(s): VITAMINB12, FOLATE, FERRITIN, TIBC, IRON, RETICCTPCT in the last 72 hours. Sepsis Labs: Recent Labs  Lab 03/12/17 0735 03/13/17 0800  PROCALCITON >150.00 >150.00    Recent Results (from the past 240 hour(s))  Blood culture (routine x 2)     Status: None   Collection Time: 03/08/17  6:03 PM  Result Value Ref Range Status   Specimen Description BLOOD LEFT ARM  Final   Special Requests IN PEDIATRIC BOTTLE Blood Culture adequate volume  Final   Culture   Final    NO GROWTH 5 DAYS Performed at [facility], [address], [place], [place] [address]    Report Status 03/13/2017 FINAL  Final  MRSA PCR Screening     Status: None   Collection Time: 03/09/17  6:16 AM  Result Value Ref  Range Status   MRSA by PCR NEGATIVE NEGATIVE Final    Comment:        The GeneXpert MRSA Assay (FDA approved for NASAL specimens only), is one component of a comprehensive MRSA colonization surveillance program. It is not intended to diagnose MRSA infection nor to guide or monitor treatment for MRSA infections. Performed at [facility], [place] [address] [place], [place] [address]   Culture, blood (single)     Status: None   Collection Time: 03/09/17 10:45 AM  Result Value Ref Range Status   Specimen Description BLOOD HEMODIALYSIS CATHETER  Final   Special Requests   Final    BOTTLES DRAWN AEROBIC AND ANAEROBIC Blood Culture results may not be optimal due to an excessive volume of blood received in culture bottles   Culture   Final    NO GROWTH 5 DAYS Performed at [facility], [place] [address], [place], [place] [address]    Report Status 03/14/2017 FINAL  Final  Respiratory Panel by PCR     Status: None   Collection Time: 03/10/17  1:12 PM  Result  Value Ref Range Status   Adenovirus NOT DETECTED NOT DETECTED Final   Coronavirus 229E NOT DETECTED NOT DETECTED Final   Coronavirus HKU1 NOT DETECTED NOT DETECTED Final   Coronavirus NL63 NOT DETECTED NOT DETECTED Final   Coronavirus OC43 NOT DETECTED NOT DETECTED Final   Metapneumovirus NOT DETECTED NOT DETECTED Final   Rhinovirus / Enterovirus NOT DETECTED NOT DETECTED Final   Influenza A NOT DETECTED NOT DETECTED Final   Influenza B NOT DETECTED NOT DETECTED Final   Parainfluenza Virus 1 NOT DETECTED NOT DETECTED Final   Parainfluenza Virus 2 NOT DETECTED NOT DETECTED Final   Parainfluenza Virus 3 NOT DETECTED NOT DETECTED Final   Parainfluenza Virus 4 NOT DETECTED NOT DETECTED Final   Respiratory Syncytial Virus NOT DETECTED NOT DETECTED Final   Bordetella pertussis NOT DETECTED NOT DETECTED Final   Chlamydophila pneumoniae NOT DETECTED NOT DETECTED Final   Mycoplasma pneumoniae NOT DETECTED NOT DETECTED Final    Comment: Performed at [facility], [place] [address], [place], [place] [address]  Culture, expectorated sputum-assessment     Status: None   Collection Time: 03/12/17 12:44 PM  Result Value Ref Range Status   Specimen Description SPUTUM  Final   Special Requests NONE  Final   Sputum evaluation   Final    Sputum specimen not acceptable for testing.  Please recollect.   Results Called to: T. FAIRING, RN AT [place] ON 03/12/17 BY C. JESSUP, MLT. Performed at [facility], [place] [address], [place], [place] [address]    Report Status 03/12/2017 FINAL  Final         Radiology Studies: No results found.      Scheduled Meds: . amLODipine  5 mg Oral Daily  . cinacalcet  60 mg Oral Q M,W,F-HD  . cloNIDine  0.3 mg Transdermal Q Mon  . darbepoetin (ARANESP) injection - DIALYSIS  60 mcg Intravenous Q Mon-HD  . guaiFENesin  1,200 mg Oral BID  . pantoprazole  80 mg Oral Daily  . sevelamer carbonate  3,200 mg Oral TID WC  . tamsulosin  0.4 mg Oral QODAY    Continuous Infusions: . ampicillin-sulbactam (UNASYN) IV Stopped (03/15/17 1402)     LOS: 7 days     Cordelia Poche, MD [facility] 03/16/2017, 10:33 AM Pager: [phone]  If 7PM-7AM, please contact night-coverage [email or web address] Password  [email or web address] 03/16/2017, 10:33 AM

## 2017-03-16 NOTE — Progress Notes (Signed)
Pharmacy Antibiotic Note  James Wall is a 52 y.o. male admitted on 03/08/2017 with pneumonia.  Pharmacy had been consulted for unasyn dosing. Patient has ESRD and is on MWF HD. Pt is afebrile and WBC is WNL. Today is D#5 of therapy.   Plan: Continue unasyn 3 g every 24 hours  *Pharmacy will sign off as no dose adjustments are anticipated. Thank you for the consult!.  Height: 5' 5"  (165.1 cm) Weight: 201 lb 14.4 oz (91.6 kg) IBW/kg (Calculated) : 61.5  Temp (24hrs), Avg:99 F (37.2 C), Min:98.8 F (37.1 C), Max:99.2 F (37.3 C)  Recent Labs  Lab 03/09/17 1317 03/10/17 0450 03/11/17 0542 03/12/17 0452 03/13/17 0745 03/14/17 0417  WBC  --  9.7 8.6 6.5 6.1 7.2  CREATININE 15.97*  --  20.75* 12.55* 15.05* 10.95*    Estimated Creatinine Clearance: 8.2 mL/min (A) (by C-G formula based on SCr of 10.95 mg/dL (H)).    Allergies  Allergen Reactions  . Aspirin Other (See Comments)    Made congestion worse    Salome Arnt, PharmD, BCPS 03/16/2017 11:32 AM

## 2017-03-16 NOTE — Plan of Care (Signed)
  Nutrition: Adequate nutrition will be maintained 03/16/2017 0606 - Completed/Met by Evert Kohl, RN   Coping: Level of anxiety will decrease 03/16/2017 0606 - Completed/Met by Evert Kohl, RN

## 2017-03-16 NOTE — Progress Notes (Signed)
Patient continues to refuse bed alarm. Will continue to monitor patient. 

## 2017-03-16 NOTE — Progress Notes (Signed)
Subjective: Patient endorses continued yellow sputum production; denies hemoptysis. He denies nausea/voming. He still has dyspnea on exertion; per patient has not been ambulated with pulse ox. He denies improvement of breathing with albuterol.  Antibiotics:  Anti-infectives (From admission, onward)   Start     Dose/Rate Route Frequency Ordered Stop   03/13/17 1200  ceFEPIme (MAXIPIME) 2 g in dextrose 5 % 50 mL IVPB  Status:  Discontinued     2 g 100 mL/hr over 30 Minutes Intravenous Every M-W-F (Hemodialysis) 03/12/17 0736 03/12/17 1518   03/12/17 1600  Ampicillin-Sulbactam (UNASYN) 3 g in sodium chloride 0.9 % 100 mL IVPB     3 g 200 mL/hr over 30 Minutes Intravenous Every 24 hours 03/12/17 1531     03/09/17 1200  vancomycin (VANCOCIN) IVPB 1000 mg/200 mL premix  Status:  Discontinued     1,000 mg 200 mL/hr over 60 Minutes Intravenous Every M-W-F (Hemodialysis) 03/09/17 0733 03/12/17 1518   03/08/17 2045  vancomycin (VANCOCIN) 1,750 mg in sodium chloride 0.9 % 500 mL IVPB     1,750 mg 250 mL/hr over 120 Minutes Intravenous  Once 03/08/17 2033 03/09/17 0231   03/08/17 2045  ceFEPIme (MAXIPIME) 1 g in dextrose 5 % 50 mL IVPB  Status:  Discontinued     1 g 100 mL/hr over 30 Minutes Intravenous Every 24 hours 03/08/17 2033 03/12/17 1518     Medications: Scheduled Meds: . amLODipine  5 mg Oral Daily  . cinacalcet  60 mg Oral Q M,W,F-HD  . cloNIDine  0.3 mg Transdermal Q Mon  . darbepoetin (ARANESP) injection - DIALYSIS  60 mcg Intravenous Q Mon-HD  . guaiFENesin  1,200 mg Oral BID  . pantoprazole  80 mg Oral Daily  . sevelamer carbonate  3,200 mg Oral TID WC  . tamsulosin  0.4 mg Oral QODAY   Continuous Infusions: . ampicillin-sulbactam (UNASYN) IV Stopped (03/15/17 1402)   PRN Meds:.acetaminophen, diphenhydrAMINE **AND** [DISCONTINUED] acetaminophen, gi cocktail, hydrALAZINE, HYDROmorphone (DILAUDID) injection, hydrOXYzine, ipratropium-albuterol, ondansetron **OR**  ondansetron (ZOFRAN) IV, oxyCODONE  Objective: Weight change: 1 lb 6.4 oz (0.635 kg)  Intake/Output Summary (Last 24 hours) at 03/16/2017 1015 Last data filed at 03/16/2017 0857 Gross per 24 hour  Intake 960 ml  Output 0 ml  Net 960 ml   Blood pressure (!) 168/98, pulse 100, temperature 99 F (37.2 C), temperature source Oral, resp. rate 18, height 5' 5"  (1.651 m), weight 201 lb 14.4 oz (91.6 kg), SpO2 98 %. Temp:  [98.9 F (37.2 C)-99.2 F (37.3 C)] 99 F (37.2 C) (02/11 0523) Pulse Rate:  [95-100] 100 (02/11 0523) Resp:  [18] 18 (02/11 0523) BP: (157-174)/(91-103) 168/98 (02/11 0523) SpO2:  [95 %-100 %] 98 % (02/11 0523) Weight:  [201 lb 14.4 oz (91.6 kg)] 201 lb 14.4 oz (91.6 kg) (02/11 0523)  Physical Exam: Constitutional: NAD, lying in bed comfortably  CV: RRR, no M/R/G, pulses intact Resp: mild rhonchorus , no increased work of breathing, speaking in full sentences Abd: obese, soft, +BS Skin: no rash Neuro: A&Ox3, CN grossly intact  CBC: CBC Latest Ref Rng & Units 03/14/2017 03/13/2017 03/12/2017  WBC 4.0 - 10.5 K/uL 7.2 6.1 6.5  Hemoglobin 13.0 - 17.0 g/dL 8.0(L) 7.6(L) 8.3(L)  Hematocrit 39.0 - 52.0 % 24.7(L) 23.3(L) 25.4(L)  Platelets 150 - 400 K/uL 199 185 171   BMET Recent Labs    03/14/17 0417  NA 137  K 4.4  CL 97*  CO2 25  GLUCOSE 94  BUN 29*  CREATININE 10.95*  CALCIUM 7.8*   Liver Panel No results for input(s): PROT, ALBUMIN, AST, ALT, ALKPHOS, BILITOT, BILIDIR, IBILI in the last 72 hours.  Sedimentation Rate No results for input(s): ESRSEDRATE in the last 72 hours. C-Reactive Protein No results for input(s): CRP in the last 72 hours.  Micro Results: Recent Results (from the past 720 hour(s))  Blood culture (routine x 2)     Status: None   Collection Time: 03/08/17  6:03 PM  Result Value Ref Range Status   Specimen Description BLOOD LEFT ARM  Final   Special Requests IN PEDIATRIC BOTTLE Blood Culture adequate volume  Final   Culture   Final      NO GROWTH 5 DAYS Performed at [facility], [address], [place], [place] [address]    Report Status 03/13/2017 FINAL  Final  MRSA PCR Screening     Status: None   Collection Time: 03/09/17  6:16 AM  Result Value Ref Range Status   MRSA by PCR NEGATIVE NEGATIVE Final    Comment:        The GeneXpert MRSA Assay (FDA approved for NASAL specimens only), is one component of a comprehensive MRSA colonization surveillance program. It is not intended to diagnose MRSA infection nor to guide or monitor treatment for MRSA infections. Performed at [facility], [place] [address] [place], [place] [address]   Culture, blood (single)     Status: None   Collection Time: 03/09/17 10:45 AM  Result Value Ref Range Status   Specimen Description BLOOD HEMODIALYSIS CATHETER  Final   Special Requests   Final    BOTTLES DRAWN AEROBIC AND ANAEROBIC Blood Culture results may not be optimal due to an excessive volume of blood received in culture bottles   Culture   Final    NO GROWTH 5 DAYS Performed at [facility], [place] [address] [place], [place] [address]    Report Status 03/14/2017 FINAL  Final  Respiratory Panel by PCR     Status: None   Collection Time: 03/10/17  1:12 PM  Result Value Ref Range Status   Adenovirus NOT DETECTED NOT DETECTED Final   Coronavirus 229E NOT DETECTED NOT DETECTED Final   Coronavirus HKU1 NOT DETECTED NOT DETECTED Final   Coronavirus NL63 NOT DETECTED NOT DETECTED Final   Coronavirus OC43 NOT DETECTED NOT DETECTED Final   Metapneumovirus NOT DETECTED NOT DETECTED Final   Rhinovirus / Enterovirus NOT DETECTED NOT DETECTED Final   Influenza A NOT DETECTED NOT DETECTED Final   Influenza B NOT DETECTED NOT DETECTED Final   Parainfluenza Virus 1 NOT DETECTED NOT DETECTED Final   Parainfluenza Virus 2 NOT DETECTED NOT DETECTED Final   Parainfluenza Virus 3 NOT DETECTED NOT DETECTED Final   Parainfluenza Virus 4 NOT DETECTED NOT DETECTED Final    Respiratory Syncytial Virus NOT DETECTED NOT DETECTED Final   Bordetella pertussis NOT DETECTED NOT DETECTED Final   Chlamydophila pneumoniae NOT DETECTED NOT DETECTED Final   Mycoplasma pneumoniae NOT DETECTED NOT DETECTED Final    Comment: Performed at [facility], [place] [address], [place], [place] [address]  Culture, expectorated sputum-assessment     Status: None   Collection Time: 03/12/17 12:44 PM  Result Value Ref Range Status   Specimen Description SPUTUM  Final   Special Requests NONE  Final   Sputum evaluation   Final    Sputum specimen not acceptable for testing.  Please recollect.   Results Called to: T. FAIRING, RN AT [place] ON 03/12/17 BY C. JESSUP, MLT. Performed at [facility], [place] [address], [place], [place] [address]    Report Status 03/12/2017 FINAL  Final   Studies/Results: No results found. Assessment/Plan:  INTERVAL HISTORY:  Orthopantogram negative for dental abscess  Principal Problem:   FUO (fever of unknown origin) Active Problems:   GERD (gastroesophageal reflux disease)   Anemia of renal disease   HCAP (healthcare-associated pneumonia)   ESRD on dialysis ([facility])   Hyperkalemia   Hypertensive urgency   Sepsis due to pneumonia ([place])   Tooth abscess   Lobar pneumonia, unspecified organism ([place])   Cirrhosis ([place])   LLQ pain   James Wall is a 52 y.o. 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 Imaging is not consistent with MRSA pneumonia and no other exposures to indicate other source of atypical infection. Respiratory panel is negative; legionella and strep pneumo antigens were negative. He is HIV negative. He does have poor dentition and dental pain; orthopantogram was obtained which showed 1st molar cavity but no associated abscess. He has been afebrile since 2/9. Repeat CXR shows  improvement in RL lobe consolidation  Plan: 1. Continue unasyn while inpt; can switch to augmentin to complete 7 day course at discharge   LOS: 7 days   [place] 03/16/2017, 10:15 AM

## 2017-03-16 NOTE — Progress Notes (Signed)
[facility] ROUNDING NOTE   Subjective:    Patient is End stage renal disease that continues on MWF dialysis  He has COPD and chronic complaints of nausea and vomiting  He was admitted with pneumonia and is being treated with unasyn   Objective:  Vital signs in last 24 hours:  Temp:  [98.9 F (37.2 C)-99.2 F (37.3 C)] 99 F (37.2 C) (02/11 0523) Pulse Rate:  [95-100] 100 (02/11 0523) Resp:  [18] 18 (02/11 0523) BP: (157-174)/(91-103) 168/98 (02/11 0523) SpO2:  [95 %-100 %] 98 % (02/11 0523) Weight:  [201 lb 14.4 oz (91.6 kg)] 201 lb 14.4 oz (91.6 kg) (02/11 0523)  Weight change: 1 lb 6.4 oz (0.635 kg) Filed Weights   03/14/17 0531 03/15/17 0525 03/16/17 0523  Weight: 198 lb 4.8 oz (89.9 kg) 200 lb 8 oz (90.9 kg) 201 lb 14.4 oz (91.6 kg)    Intake/Output: I/O last 3 completed shifts: In: 1080 [P.O.:1080] Out: 0    Intake/Output this shift:  Total I/O In: 240 [P.O.:240] Out: -   Alert, no distress NO jvd Chest clear bilat RRR Abd soft ntnd Ext no wounds or edema NF, ox3     Basic Metabolic Panel: Recent Labs  Lab 03/09/17 1317 03/11/17 0542 03/12/17 0452 03/13/17 0745 03/14/17 0417  NA 133* 135 137 136 137  K 4.9 5.6* 4.7 4.5 4.4  CL 94* 93* 95* 95* 97*  CO2 19* 20* 25 23 25   GLUCOSE 80 87 93 101* 94  BUN 54* 75* 36* 46* 29*  CREATININE 15.97* 20.75* 12.55* 15.05* 10.95*  CALCIUM 8.2* 8.1* 7.9* 8.1* 7.8*  MG  --  2.0  --   --   --   PHOS  --  3.7  --  4.0  --     Liver Function Tests: Recent Labs  Lab 03/11/17 0542 03/13/17 0745  AST 15  --   ALT 10*  --   ALKPHOS 40  --   BILITOT 1.1  --   PROT 6.2*  --   ALBUMIN 2.8* 2.5*   No results for input(s): LIPASE, AMYLASE in the last 168 hours. No results for input(s): AMMONIA in the last 168 hours.  CBC: Recent Labs  Lab 03/10/17 0450 03/11/17 0542 03/12/17 0452 03/13/17 0745 03/14/17 0417  WBC 9.7 8.6 6.5 6.1 7.2  NEUTROABS  --  5.7  --   --   --   HGB 8.9* 8.9* 8.3*  7.6* 8.0*  HCT 26.7* 27.0* 25.4* 23.3* 24.7*  MCV 101.5* 99.6 100.4* 99.6 101.2*  PLT 180 187 171 185 199    Cardiac Enzymes: No results for input(s): CKTOTAL, CKMB, CKMBINDEX, TROPONINI in the last 168 hours.  BNP: Invalid input(s): POCBNP  CBG: No results for input(s): GLUCAP in the last 168 hours.  Microbiology: Results for orders placed or performed during the hospital encounter of 03/08/17  Blood culture (routine x 2)     Status: None   Collection Time: 03/08/17  6:03 PM  Result Value Ref Range Status   Specimen Description BLOOD LEFT ARM  Final   Special Requests IN PEDIATRIC BOTTLE Blood Culture adequate volume  Final   Culture   Final    NO GROWTH 5 DAYS Performed at [facility], [place] [address] [place], [place] [address]    Report Status 03/13/2017 FINAL  Final  MRSA PCR Screening     Status: None   Collection Time: 03/09/17  6:16 AM  Result Value Ref Range  Status   MRSA by PCR NEGATIVE NEGATIVE Final    Comment:        The GeneXpert MRSA Assay (FDA approved for NASAL specimens only), is one component of a comprehensive MRSA colonization surveillance program. It is not intended to diagnose MRSA infection nor to guide or monitor treatment for MRSA infections. Performed at [facility], [place] [address], [place], [place] [address]   Culture, blood (single)     Status: None   Collection Time: 03/09/17 10:45 AM  Result Value Ref Range Status   Specimen Description BLOOD HEMODIALYSIS CATHETER  Final   Special Requests   Final    BOTTLES DRAWN AEROBIC AND ANAEROBIC Blood Culture results may not be optimal due to an excessive volume of blood received in culture bottles   Culture   Final    NO GROWTH 5 DAYS Performed at [facility], [place] [address], [place], [place] [address]    Report Status 03/14/2017 FINAL  Final  Respiratory Panel by PCR     Status: None   Collection Time: 03/10/17  1:12 PM  Result Value Ref Range Status   Adenovirus NOT  DETECTED NOT DETECTED Final   Coronavirus 229E NOT DETECTED NOT DETECTED Final   Coronavirus HKU1 NOT DETECTED NOT DETECTED Final   Coronavirus NL63 NOT DETECTED NOT DETECTED Final   Coronavirus OC43 NOT DETECTED NOT DETECTED Final   Metapneumovirus NOT DETECTED NOT DETECTED Final   Rhinovirus / Enterovirus NOT DETECTED NOT DETECTED Final   Influenza A NOT DETECTED NOT DETECTED Final   Influenza B NOT DETECTED NOT DETECTED Final   Parainfluenza Virus 1 NOT DETECTED NOT DETECTED Final   Parainfluenza Virus 2 NOT DETECTED NOT DETECTED Final   Parainfluenza Virus 3 NOT DETECTED NOT DETECTED Final   Parainfluenza Virus 4 NOT DETECTED NOT DETECTED Final   Respiratory Syncytial Virus NOT DETECTED NOT DETECTED Final   Bordetella pertussis NOT DETECTED NOT DETECTED Final   Chlamydophila pneumoniae NOT DETECTED NOT DETECTED Final   Mycoplasma pneumoniae NOT DETECTED NOT DETECTED Final    Comment: Performed at [facility], [place] [address] [place], [place] [address]  Culture, expectorated sputum-assessment     Status: None   Collection Time: 03/12/17 12:44 PM  Result Value Ref Range Status   Specimen Description SPUTUM  Final   Special Requests NONE  Final   Sputum evaluation   Final    Sputum specimen not acceptable for testing.  Please recollect.   Results Called to: T. FAIRING, RN AT [place] ON 03/12/17 BY C. JESSUP, MLT. Performed at [facility], [place] [address], [place], [place] [address]    Report Status 03/12/2017 FINAL  Final    Coagulation Studies: No results for input(s): LABPROT, INR in the last 72 hours.  Urinalysis: No results for input(s): COLORURINE, LABSPEC, PHURINE, GLUCOSEU, HGBUR, BILIRUBINUR, KETONESUR, PROTEINUR, UROBILINOGEN, NITRITE, LEUKOCYTESUR in the last 72 hours.  Invalid input(s): APPERANCEUR    Imaging: US Abdomen Complete  Result Date: 03/14/2017 CLINICAL DATA:  Cirrhosis EXAM: ABDOMEN ULTRASOUND COMPLETE COMPARISON:  CT 03/08/2017 FINDINGS:  Gallbladder: No gallstones or wall thickening visualized. No sonographic Murphy sign noted by sonographer. Common bile duct: Diameter: Normal caliber, 4 mm Liver: Heterogeneous echotexture. No focal mass. Portal vein is patent on color Doppler imaging with normal direction of blood flow towards the liver. IVC: No abnormality visualized. Pancreas: Visualized portion unremarkable. Spleen: Size and appearance within normal limits. Right Kidney: Length: 9.6 cm. Increased echotexture and  cortical thinning. Multiple cysts. No hydronephrosis. Left Kidney: Length: 10.3 cm. Increased echotexture and cortical thinning. Multiple cysts. No hydronephrosis. Abdominal aorta: No aneurysm visualized. Other findings: No ascites visualized. IMPRESSION: Heterogeneous echotexture throughout the liver. No focal hepatic abnormality. Increased echotexture and cortical thinning in the kidneys bilaterally compatible with chronic medical renal disease. Multiple renal cysts. Electronically Signed   By: Rolm Baptise M.D.   On: 03/14/2017 09:23     Medications:   . ampicillin-sulbactam (UNASYN) IV Stopped (03/15/17 1402)   . amLODipine  5 mg Oral Daily  . cinacalcet  60 mg Oral Q M,W,F-HD  . cloNIDine  0.3 mg Transdermal Q Mon  . darbepoetin (ARANESP) injection - DIALYSIS  60 mcg Intravenous Q Mon-HD  . guaiFENesin  1,200 mg Oral BID  . pantoprazole  80 mg Oral Daily  . sevelamer carbonate  3,200 mg Oral TID WC  . tamsulosin  0.4 mg Oral QODAY   acetaminophen, diphenhydrAMINE **AND** [DISCONTINUED] acetaminophen, gi cocktail, hydrALAZINE, HYDROmorphone (DILAUDID) injection, hydrOXYzine, ipratropium-albuterol, ondansetron **OR** ondansetron (ZOFRAN) IV, oxyCODONE  Assessment/ Plan:  Dialysis: MWF [facility] 3.5h   90kg   2/2 bath   TDC (exchanged 1/31 CKV low BFR)  P4   Hep 4000 + 1K mid VDRA:C3 3.39mg qTx PARSABIV: 551mqTx EPCBS:WHQPRFF0 q2wk, last given 1/30 Home BP: clonidine patch 0.3 weekly       Impression: 1   Fevers - persistent, unclear cause, resp PCR and blood cx's neg.  Vanc/ Cefepime stopped and started on UNasyn per ID. 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

## 2017-03-17 DIAGNOSIS — J690 Pneumonitis due to inhalation of food and vomit: Secondary | ICD-10-CM

## 2017-03-17 DIAGNOSIS — D62 Acute posthemorrhagic anemia: Secondary | ICD-10-CM

## 2017-03-17 DIAGNOSIS — K047 Periapical abscess without sinus: Secondary | ICD-10-CM

## 2017-03-17 MED ORDER — POLYETHYLENE GLYCOL 3350 17 G PO PACK: 17.0000 g | PACK | Freq: Every day | ORAL | 0 refills | Status: DC

## 2017-03-17 MED ORDER — GUAIFENESIN ER 600 MG PO TB12: 1200.0000 mg | ORAL_TABLET | Freq: Two times a day (BID) | ORAL | 0 refills | Status: DC

## 2017-03-17 MED ORDER — DIPHENHYDRAMINE-APAP (SLEEP) 25-500 MG PO TABS: 1.0000 | ORAL_TABLET | Freq: Every evening | ORAL | Status: DC | PRN

## 2017-03-17 MED ORDER — SENSIPAR 60 MG PO TABS: 60.0000 mg | ORAL_TABLET | ORAL | Status: DC

## 2017-03-17 MED ORDER — AMLODIPINE BESYLATE 10 MG PO TABS
10.0000 mg | ORAL_TABLET | Freq: Every day | ORAL | Status: DC
  Administered 2017-03-17: 10 mg via ORAL
  Filled 2017-03-17: qty 1

## 2017-03-17 MED ORDER — ACETAMINOPHEN ER 650 MG PO TBCR: 650.0000 mg | EXTENDED_RELEASE_TABLET | Freq: Every day | ORAL | Status: DC | PRN

## 2017-03-17 MED ORDER — AMOXICILLIN-POT CLAVULANATE 500-125 MG PO TABS: 1.0000 | ORAL_TABLET | Freq: Every day | ORAL | 0 refills | Status: DC

## 2017-03-17 MED ORDER — AMLODIPINE BESYLATE 10 MG PO TABS: 10.0000 mg | ORAL_TABLET | Freq: Every day | ORAL | 0 refills | Status: DC

## 2017-03-17 MED ORDER — AMOXICILLIN-POT CLAVULANATE 500-125 MG PO TABS
1.0000 | ORAL_TABLET | Freq: Every day | ORAL | Status: DC
  Administered 2017-03-17: 500 mg via ORAL
  Filled 2017-03-17: qty 1

## 2017-03-17 NOTE — Care Management Important Message (Signed)
Important Message  Patient Details  Name: James Wall MRN: [id number] Date of Birth: 1965-04-05   Medicare Important Message Given:  Yes    Orbie Pyo 03/17/2017, 8:46 AM

## 2017-03-17 NOTE — Discharge Summary (Signed)
Physician Discharge Summary  James Wall PIR:[id number] DOB: 1965/07/28 DOA: 03/08/2017  PCP: James Cha, MD  Admit date: 03/08/2017 Discharge date: 03/17/2017  Admitted From: Home Disposition: Home  Recommendations for Outpatient Follow-up:  1. Follow up with PCP in 1 week 2. Follow up with gastroenterology in 1-2 weeks 3. Please obtain BMP/CBC in one week 4. Please obtain repeat chest x-ray in 2-3 weeks 5. Please follow up on the following pending results: None  Home Health: None Equipment/Devices: None  Discharge Condition: Stable CODE STATUS: Full code Diet recommendation: Heart healthy/renal   Brief/Interim Summary:  Admission HPI written by James Morton, MD   HPI: James Wall is a 52 y.o. male with medical history significant of ESRD on HD M/W/F, HTN, CAD, COPD, chronic intermittent nausea/vomiting; who presents with complaints of fever and chills over the last 1-2 days.  Reports having subjective fevers, nausea, vomiting, bloating, intermittent belching, and left-sided abdominal pain/discomfort.  Notes that the abdominal pain has been radiating up almost into his chest.  Denies trying anything for the symptoms.  He had just recently had a Vas-Cath placed on 3 days ago after losing previous access.  His last dialysis session was on 12/30, as he did not want to have to endure discomfort with dialysis from the recently placed Vas-Cath..  There is been some mild tenderness around the area, but no significant redness or swelling that he is noticed.  Other associated symptoms include nerve tooth pain, spitting white mucus, and 3 intermittent reports of blood with stools.  Patient reports being a homosexual male who " bottoms "/receives reciprocal intercourse, but states that he is not been sexually active for the last 2 years.  Denies any complaints of chest pain, shortness of breath, or significant cough  ED Course: Upon admission into the emergency  department patient was noted to be febrile up to 102.5 F, pulse 65-97, respirations 14-28, blood pressure 168/94-186/101, and O2 saturations 91-100% on room air.  Initial labs obtained the CBC and lactic acid.  WBC 9.2, hemoglobin 10.4, platelets 219, and lactic acid 1.06.  Urinalysis did not appear to show significant signs of infection.  CT scan of the abdomen and pelvis without contrast showed patchy opacities concerning for pneumonia, COPD and bronchitis changes, cardiomegaly and atrophy kidneys with multiple small hemorrhagic and nonhemorrhagic cysts, diffuse bone sclerosis.  After BMP resulted showing elevated potassium of 6.7.  Nephrology was consulted and placed orders for hemodialysis in a.m.  Patient was given calcium gluconate and Kayexalate for treatment of hyperkalemia.    Hospital course:  Sepsis Secondary to pneumonia. Blood culture/HD cath no growth to date. Treatment below.  HCAP Lobar pneumonia Aspiration pneumonia Patient initially treated with Vancomycin and Cefepime. CT chest significant for right upper lobe consolidation in addition to bilateral basilar infiltrates. With continued fevers, infectious disease consulted. Patient with poor dentition so anaerobic coverage considered and patient transitioned to Augmentin with continued improvement. Repeat chest x-ray showed interval improvement. Fevers subsided. Legionella negative. HIV negative. Patient transitioned to Augmentin for discharge to complete a 7 day antibiotic course including anaerobe coverage. Repeat chest x-ray in 2-3 weeks.  Fevers Likely secondary to pneumonia. Resolved once regimen transitioned to cover anaerobes.   Hyperkalemia Manage with HD per nephrology  ESRD on MWF HD Nephrology managed while inpatient.  Hypertensive urgency Continued clonidine and added amlodipine  Abdominal pain Likely secondary to pneumonia. CT unremarkable for abdominal etiology. No evidence of pancreatitis. Worse with  coughing. Reproducible.  Nausea and vomiting  Resolved.  Anemia of chronic disease Secondary to CKD. Also with acute blood loss anemia.   Bright red blood per rectum Acute blood loss anemia 2gm drop from admission. Stable currently. Has had a colonoscopy in the past with James Wall. Had two episode of bright red blood per rectum. Wall consulted and recommendation for outpatient colonoscopy. External hemorrhoid seen on DRE.  GERD Continued PPI  Hemorrhagic/nonhemorrhagic renal cysts Seen on CT scan incidentally. Outpatient follow-up.  Non-alcoholic cirrhosis Stable. Tylenol dose adjusted.   Discharge Diagnoses:  Principal Problem:   FUO (fever of unknown origin) Active Problems:   GERD (gastroesophageal reflux disease)   Anemia of renal disease   HCAP (healthcare-associated pneumonia)   ESRD on dialysis (HCC)   Hyperkalemia   Hypertensive urgency   Sepsis due to pneumonia [facility])   Tooth abscess   Lobar pneumonia, unspecified organism ([place])   Cirrhosis ([place])   LLQ pain   Aspiration pneumonia [facility])    Discharge Instructions  Discharge Instructions    Diet - low sodium heart healthy   Complete by:  As directed    Increase activity slowly   Complete by:  As directed      Allergies as of 03/17/2017      Reactions   Aspirin Other (See Comments)   Made congestion worse      Medication List    TAKE these medications   acetaminophen 650 MG CR tablet Commonly known as:  TYLENOL Take 1-2 tablets (650-1,300 mg total) by mouth daily as needed for pain. What changed:  how much to take   amLODipine 10 MG tablet Commonly known as:  NORVASC Take 1 tablet (10 mg total) by mouth daily. Start taking on:  03/18/2017   amoxicillin-clavulanate 500-125 MG tablet Commonly known as:  AUGMENTIN Take 1 tablet (500 mg total) by mouth daily. Start taking on:  03/18/2017   atorvastatin 20 MG tablet Commonly known as:  LIPITOR Take 1 tablet (20 mg total) by mouth daily.    cloNIDine 0.3 mg/24hr patch Commonly known as:  CATAPRES - Dosed in mg/24 hr Place 0.3 mg onto the skin every Sunday. Saturday.   diphenhydramine-acetaminophen 25-500 MG Tabs tablet Commonly known as:  TYLENOL PM Take 1 tablet by mouth at bedtime as needed (sleep). What changed:  how much to take   guaiFENesin 600 MG 12 hr tablet Commonly known as:  MUCINEX Take 2 tablets (1,200 mg total) by mouth 2 (two) times daily.   hydrOXYzine 25 MG tablet Commonly known as:  ATARAX/VISTARIL Take 25 mg by mouth every 8 (eight) hours as needed for itching.   omeprazole 20 MG capsule Commonly known as:  PRILOSEC Take 20 mg by mouth daily as needed (acid reflux).   polyethylene glycol packet Commonly known as:  MIRALAX / GLYCOLAX Take 17 g by mouth daily.   SENSIPAR 60 MG tablet Generic drug:  cinacalcet Take 1 tablet (60 mg total) by mouth every Monday, Wednesday, and Friday with hemodialysis.   sevelamer carbonate 800 MG tablet Commonly known as:  RENVELA Take 3,200-4,800 mg by mouth See admin instructions. Take 4-6 tablets (3200-4800 mg) by mouth one to two times daily with meals   tamsulosin 0.4 MG Caps capsule Commonly known as:  FLOMAX Take 0.4 mg by mouth every other day.      Follow-up Information    James Cha, MD. Schedule an appointment as soon as possible for a visit in 1 week(s).   Specialty:  Internal Medicine Why:  You will need  a repeat chest x-ray Contact information: [address] [place] [address] [phone]          Allergies  Allergen Reactions  . Aspirin Other (See Comments)    Made congestion worse    Consultations:  Gastroenterology  Nephrology   Procedures/Studies: Ct Abdomen Pelvis Wo Contrast  Result Date: 03/08/2017 CLINICAL DATA:  Chills, abdominal pain and vomiting. New dialysis catheter placed three days ago. EXAM: CT ABDOMEN AND PELVIS WITHOUT CONTRAST TECHNIQUE: Multidetector CT imaging of the  abdomen and pelvis was performed following the standard protocol without IV contrast. COMPARISON:  04/19/2013. FINDINGS: Lower chest: Patchy opacity in both lower lobes, greater on the right. Mild interstitial prominence at both lung bases. Prominent pulmonary vasculature. Diffuse peribronchial thickening. Left lower lobe bullous change. Enlarged heart. Hepatobiliary: No focal liver abnormality is seen. No gallstones, gallbladder wall thickening, or biliary dilatation. Pancreas: Unremarkable. No pancreatic ductal dilatation or surrounding inflammatory changes. Spleen: Normal in size without focal abnormality. Adrenals/Urinary Tract: Normal appearing adrenal glands. Small kidneys with multiple hemorrhagic and nonhemorrhagic cysts. Poorly distended urinary bladder. No ureteral abnormalities seen. Stomach/Bowel: Stomach is within normal limits. Appendix appears normal. No evidence of bowel wall thickening, distention, or inflammatory changes. Vascular/Lymphatic: Atheromatous arterial calcifications without aneurysm. No enlarged lymph nodes. Reproductive: Prostate is unremarkable. Other: Small bilateral inguinal hernias containing fat. Small umbilical and left paraumbilical hernia containing fat. Musculoskeletal: Diffuse bone sclerosis. Lower thoracic spine degenerative changes. IMPRESSION: 1. Patchy opacity in both lower lobes, greater on the right, suspicious for pneumonia. 2. Changes of COPD and bronchitis at the lung bases with pulmonary vascular congestion. 3. Cardiomegaly. 4. Atrophied kidneys containing multiple small hemorrhagic and nonhemorrhagic cysts. 5. Diffuse bone sclerosis, compatible with renal osteodystrophy. Electronically Signed   By: Claudie Revering M.D.   On: 03/08/2017 19:04   Dg Orthopantogram  Result Date: 03/12/2017 CLINICAL DATA:  Right tooth pain and possible tooth abscess for the past 2 weeks. Treated with antibiotics for the past week. EXAM: ORTHOPANTOGRAM/PANORAMIC COMPARISON:  None.  FINDINGS: Surgically absent right lower 3rd molar. There is a 10 mm rounded lucency beneath a metallic dental filling in the right lower 1st molar. No periapical lucencies are seen. IMPRESSION: Probable large cavity beneath a metallic dental filling in the right lower 1st molar. No associated periapical abscess seen. Electronically Signed   By: Claudie Revering M.D.   On: 03/12/2017 17:19   Dg Chest 2 View  Result Date: 03/08/2017 CLINICAL DATA:  Patient had new dialysis catheter placed this past Thursday and yesterday developed chills and abdominal soreness. Has had vomiting today. hx smoking but recently quit. Fever today. EXAM: CHEST  2 VIEW COMPARISON:  07/09/2013 FINDINGS: Right IJ dialysis catheter tip overlies the level of the lower superior vena cava. Heart is enlarged. There are no focal consolidations or pleural effusions. No pulmonary edema. No pneumothorax. IMPRESSION: Stable cardiomegaly.  No evidence for acute pulmonary abnormality. Electronically Signed   By: Nolon Nations M.D.   On: 03/08/2017 14:48   Ct Chest W Contrast  Result Date: 03/11/2017 CLINICAL DATA:  Pneumonia, pain with breathing, history of end-stage renal disease on dialysis, COPD, cirrhosis, hypertension EXAM: CT CHEST WITH CONTRAST TECHNIQUE: Multidetector CT imaging of the chest was performed during intravenous contrast administration. CONTRAST:  75 mL ISOVUE-300 IOPAMIDOL (ISOVUE-300) INJECTION 61% IV COMPARISON:  Chest radiograph 03/08/2017; prior CT chest 07/12/2013 FINDINGS: Cardiovascular: Atherosclerotic calcifications aorta, coronary arteries and proximal great vessels. Aorta normal caliber. Minimal pericardial fluid. Heart otherwise unremarkable. RIGHT  jugular central venous catheter with tip at superior RIGHT atrium. Mediastinum/Nodes: Esophagus normal appearance. Scattered normal size mediastinal lymph nodes. No thoracic adenopathy. Base of cervical region normal appearance. Lungs/Pleura: Patchy airspace consolidation  in the posterior segment of the RIGHT upper lobe and in BILATERAL lower lobes RIGHT greater than LEFT. Bleb at lateral RIGHT lung base. Minimal atelectasis in posterior LEFT upper lobe adjacent to major fissure. Very minimal LEFT pleural effusion. No pneumothorax. Upper Abdomen: Contracted gallbladder. Atrophic upper pole LEFT kidney with small cysts consistent with history of end-stage renal disease. Nodular appearing liver consistent with history of cirrhosis. Remaining visualized upper abdomen otherwise unremarkable. 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 IMPRESSION: Patchy airspace infiltrates in RIGHT upper lobe and to lesser degrees in RIGHT greater than LEFT lower lobes favoring pneumonia. Extensive atherosclerotic disease changes including coronary arteries. Aortic Atherosclerosis (ICD10-I70.0). Electronically Signed   By: Lavonia Dana M.D.   On: 03/11/2017 20:14   US Abdomen Complete  Result Date: 03/14/2017 CLINICAL DATA:  Cirrhosis EXAM: ABDOMEN ULTRASOUND COMPLETE COMPARISON:  CT 03/08/2017 FINDINGS: Gallbladder: No gallstones or wall thickening visualized. No sonographic Murphy sign noted by sonographer. Common bile duct: Diameter: Normal caliber, 4 mm Liver: Heterogeneous echotexture. No focal mass. Portal vein is patent on color Doppler imaging with normal direction of blood flow towards the liver. IVC: No abnormality visualized. Pancreas: Visualized portion unremarkable. Spleen: Size and appearance within normal limits. Right Kidney: Length: 9.6 cm. Increased echotexture and cortical thinning. Multiple cysts. No hydronephrosis. Left Kidney: Length: 10.3 cm. Increased echotexture and cortical thinning. Multiple cysts. No hydronephrosis. Abdominal aorta: No aneurysm visualized. Other findings: No ascites visualized. IMPRESSION: Heterogeneous echotexture throughout the liver. No focal hepatic abnormality. Increased echotexture and cortical thinning in the  kidneys bilaterally compatible with chronic medical renal disease. Multiple renal cysts. Electronically Signed   By: Rolm Baptise M.D.   On: 03/14/2017 09:23   Dg Chest Port 1 View  Result Date: 03/13/2017 CLINICAL DATA:  Follow-up pulmonary edema and/or pneumonia. End-stage renal disease on hemodialysis. EXAM: PORTABLE CHEST 1 VIEW COMPARISON:  03/12/2017, 03/08/2017 and earlier, including CT chest 03/11/2017 and earlier. FINDINGS: Cardiac silhouette moderately enlarged, unchanged. Persistent mild ground-glass airspace opacities in the right lower lobe and to a lesser degree the right upper lobe, with gradual improvement over the past 2 days. Mild pulmonary venous hypertension without overt edema currently. No new pulmonary parenchymal abnormalities. No visible pleural effusions. Right jugular dialysis catheter tip projects at or near the cavoatrial junction, unchanged. IMPRESSION: 1. Gradually improving ground-glass airspace disease in the right lung over the past 2 days, indicating improving pneumonia. 2. No new abnormalities. Electronically Signed   By: Evangeline Dakin M.D.   On: 03/13/2017 17:11   Dg Chest Port 1 View  Result Date: 03/12/2017 CLINICAL DATA:  Short of breath EXAM: PORTABLE CHEST 1 VIEW COMPARISON:  03/11/2017, 03/08/2017 FINDINGS: Dialysis access catheter at the cavoatrial junction unchanged. Bibasilar airspace disease is more prominent and could represent edema or pneumonia. Mild vascular congestion. No effusion. Cardiac enlargement IMPRESSION: Mild progression of bibasilar airspace disease. Probable edema versus pneumonia. Electronically Signed   By: Franchot Gallo M.D.   On: 03/12/2017 09:27      Subjective: Some mild left sided abdominal pain with coughing. Sweats overnight. Otherwise feels well.  Discharge Exam: Vitals:   03/17/17 0458 03/17/17 0800  BP: (!) 146/76 (!) 161/94  Pulse: 89 95  Resp: 18   Temp: 99.8 F (37.7 C) 98.9 F (37.2 C)  SpO2: 98% 97%   Vitals:    03/16/17 1520 03/16/17 1936 03/17/17 0458 03/17/17 0800  BP: (!) 168/105 (!) 140/96 (!) 146/76 (!) 161/94  Pulse: 86 (!) 110 89 95  Resp: 18 18 18    Temp: 98.1 F (36.7 C) 99.6 F (37.6 C) 99.8 F (37.7 C) 98.9 F (37.2 C)  TempSrc: Oral Oral Oral Oral  SpO2:  95% 98% 97%  Weight:   88.5 kg (195 lb 3.2 oz)   Height:        General: Pt is alert, awake, not in acute distres   The results of significant diagnostics from this hospitalization (including imaging, microbiology, ancillary and laboratory) are listed below for reference.     Microbiology: Recent Results (from the past 240 hour(s))  Blood culture (routine x 2)     Status: None   Collection Time: 03/08/17  6:03 PM  Result Value Ref Range Status   Specimen Description BLOOD LEFT ARM  Final   Special Requests IN PEDIATRIC BOTTLE Blood Culture adequate volume  Final   Culture   Final    NO GROWTH 5 DAYS Performed at [facility], [address] [place], [place] [address]    Report Status 03/13/2017 FINAL  Final  MRSA PCR Screening     Status: None   Collection Time: 03/09/17  6:16 AM  Result Value Ref Range Status   MRSA by PCR NEGATIVE NEGATIVE Final    Comment:        The GeneXpert MRSA Assay (FDA approved for NASAL specimens only), is one component of a comprehensive MRSA colonization surveillance program. It is not intended to diagnose MRSA infection nor to guide or monitor treatment for MRSA infections. Performed at [facility], [place] [address], [place], [place] [address]   Culture, blood (single)     Status: None   Collection Time: 03/09/17 10:45 AM  Result Value Ref Range Status   Specimen Description BLOOD HEMODIALYSIS CATHETER  Final   Special Requests   Final    BOTTLES DRAWN AEROBIC AND ANAEROBIC Blood Culture results may not be optimal due to an excessive volume of blood received in culture bottles   Culture   Final    NO GROWTH 5 DAYS Performed at [facility], [place]  [address] [place], [place] [address]    Report Status 03/14/2017 FINAL  Final  Respiratory Panel by PCR     Status: None   Collection Time: 03/10/17  1:12 PM  Result Value Ref Range Status   Adenovirus NOT DETECTED NOT DETECTED Final   Coronavirus 229E NOT DETECTED NOT DETECTED Final   Coronavirus HKU1 NOT DETECTED NOT DETECTED Final   Coronavirus NL63 NOT DETECTED NOT DETECTED Final   Coronavirus OC43 NOT DETECTED NOT DETECTED Final   Metapneumovirus NOT DETECTED NOT DETECTED Final   Rhinovirus / Enterovirus NOT DETECTED NOT DETECTED Final   Influenza A NOT DETECTED NOT DETECTED Final   Influenza B NOT DETECTED NOT DETECTED Final   Parainfluenza Virus 1 NOT DETECTED NOT DETECTED Final   Parainfluenza Virus 2 NOT DETECTED NOT DETECTED Final   Parainfluenza Virus 3 NOT DETECTED NOT DETECTED Final   Parainfluenza Virus 4 NOT DETECTED NOT DETECTED Final   Respiratory Syncytial Virus NOT DETECTED NOT DETECTED Final   Bordetella pertussis NOT DETECTED NOT DETECTED Final   Chlamydophila pneumoniae NOT DETECTED NOT DETECTED Final   Mycoplasma pneumoniae NOT DETECTED NOT DETECTED Final    Comment: Performed at [facility], [place] [address], [place], [place] [address]  Culture, expectorated sputum-assessment     Status: None   Collection Time: 03/12/17 12:44 PM  Result Value Ref Range Status   Specimen Description SPUTUM  Final   Special Requests NONE  Final   Sputum evaluation   Final    Sputum specimen not acceptable for testing.  Please recollect.   Results Called to: T. FAIRING, RN AT [place] ON 03/12/17 BY C. JESSUP, MLT. Performed at [facility], [place] [address], [place], [place] [address]    Report Status 03/12/2017 FINAL  Final     Labs: BNP (last 3 results) No results for input(s): BNP in the last 8760 hours. Basic Metabolic Panel: Recent Labs  Lab 03/11/17 0542 03/12/17 0452 03/13/17 0745 03/14/17 0417 03/16/17 1100  NA 135 137 136 137 136  K 5.6* 4.7 4.5 4.4 4.6  CL  93* 95* 95* 97* 94*  CO2 20* 25 23 25 22   GLUCOSE 87 93 101* 94 86  BUN 75* 36* 46* 29* 53*  CREATININE 20.75* 12.55* 15.05* 10.95* 17.59*  CALCIUM 8.1* 7.9* 8.1* 7.8* 7.7*  MG 2.0  --   --   --   --   PHOS 3.7  --  4.0  --   --    Liver Function Tests: Recent Labs  Lab 03/11/17 0542 03/13/17 0745  AST 15  --   ALT 10*  --   ALKPHOS 40  --   BILITOT 1.1  --   PROT 6.2*  --   ALBUMIN 2.8* 2.5*   No results for input(s): LIPASE, AMYLASE in the last 168 hours. No results for input(s): AMMONIA in the last 168 hours. CBC: Recent Labs  Lab 03/11/17 0542 03/12/17 0452 03/13/17 0745 03/14/17 0417 03/16/17 1100  WBC 8.6 6.5 6.1 7.2 5.1  NEUTROABS 5.7  --   --   --   --   HGB 8.9* 8.3* 7.6* 8.0* 7.8*  HCT 27.0* 25.4* 23.3* 24.7* 23.6*  MCV 99.6 100.4* 99.6 101.2* 100.0  PLT 187 171 185 199 296   Cardiac Enzymes: No results for input(s): CKTOTAL, CKMB, CKMBINDEX, TROPONINI in the last 168 hours. BNP: Invalid input(s): POCBNP CBG: No results for input(s): GLUCAP in the last 168 hours. D-Dimer No results for input(s): DDIMER in the last 72 hours. Hgb A1c No results for input(s): HGBA1C in the last 72 hours. Lipid Profile No results for input(s): CHOL, HDL, LDLCALC, TRIG, CHOLHDL, LDLDIRECT in the last 72 hours. Thyroid function studies No results for input(s): TSH, T4TOTAL, T3FREE, THYROIDAB in the last 72 hours.  Invalid input(s): FREET3 Anemia work up No results for input(s): VITAMINB12, FOLATE, FERRITIN, TIBC, IRON, RETICCTPCT in the last 72 hours. Urinalysis    Component Value Date/Time   COLORURINE STRAW (A) 03/08/2017 1357   APPEARANCEUR CLEAR 03/08/2017 1357   LABSPEC 1.008 03/08/2017 1357   PHURINE 9.0 (H) 03/08/2017 1357   GLUCOSEU NEGATIVE 03/08/2017 1357   HGBUR SMALL (A) 03/08/2017 1357   BILIRUBINUR NEGATIVE 03/08/2017 1357   KETONESUR NEGATIVE 03/08/2017 1357   PROTEINUR NEGATIVE 03/08/2017 1357   NITRITE NEGATIVE 03/08/2017 1357   LEUKOCYTESUR  NEGATIVE 03/08/2017 1357   Sepsis Labs Invalid input(s): PROCALCITONIN,  WBC,  LACTICIDVEN Microbiology Recent Results (from the past 240 hour(s))  Blood culture (routine x 2)     Status: None   Collection Time: 03/08/17  6:03 PM  Result Value Ref Range Status   Specimen Description BLOOD LEFT ARM  Final  Special Requests IN PEDIATRIC BOTTLE Blood Culture adequate volume  Final   Culture   Final    NO GROWTH 5 DAYS Performed at [facility], [place] [address], [place], [place] [address]    Report Status 03/13/2017 FINAL  Final  MRSA PCR Screening     Status: None   Collection Time: 03/09/17  6:16 AM  Result Value Ref Range Status   MRSA by PCR NEGATIVE NEGATIVE Final    Comment:        The GeneXpert MRSA Assay (FDA approved for NASAL specimens only), is one component of a comprehensive MRSA colonization surveillance program. It is not intended to diagnose MRSA infection nor to guide or monitor treatment for MRSA infections. Performed at [facility], [place] [address], [place], [place] [address]   Culture, blood (single)     Status: None   Collection Time: 03/09/17 10:45 AM  Result Value Ref Range Status   Specimen Description BLOOD HEMODIALYSIS CATHETER  Final   Special Requests   Final    BOTTLES DRAWN AEROBIC AND ANAEROBIC Blood Culture results may not be optimal due to an excessive volume of blood received in culture bottles   Culture   Final    NO GROWTH 5 DAYS Performed at [facility], [place] [address], [place], [place] [address]    Report Status 03/14/2017 FINAL  Final  Respiratory Panel by PCR     Status: None   Collection Time: 03/10/17  1:12 PM  Result Value Ref Range Status   Adenovirus NOT DETECTED NOT DETECTED Final   Coronavirus 229E NOT DETECTED NOT DETECTED Final   Coronavirus HKU1 NOT DETECTED NOT DETECTED Final   Coronavirus NL63 NOT DETECTED NOT DETECTED Final   Coronavirus OC43 NOT DETECTED NOT DETECTED Final   Metapneumovirus NOT  DETECTED NOT DETECTED Final   Rhinovirus / Enterovirus NOT DETECTED NOT DETECTED Final   Influenza A NOT DETECTED NOT DETECTED Final   Influenza B NOT DETECTED NOT DETECTED Final   Parainfluenza Virus 1 NOT DETECTED NOT DETECTED Final   Parainfluenza Virus 2 NOT DETECTED NOT DETECTED Final   Parainfluenza Virus 3 NOT DETECTED NOT DETECTED Final   Parainfluenza Virus 4 NOT DETECTED NOT DETECTED Final   Respiratory Syncytial Virus NOT DETECTED NOT DETECTED Final   Bordetella pertussis NOT DETECTED NOT DETECTED Final   Chlamydophila pneumoniae NOT DETECTED NOT DETECTED Final   Mycoplasma pneumoniae NOT DETECTED NOT DETECTED Final    Comment: Performed at [facility], [place] [address] [place], [place] [address]  Culture, expectorated sputum-assessment     Status: None   Collection Time: 03/12/17 12:44 PM  Result Value Ref Range Status   Specimen Description SPUTUM  Final   Special Requests NONE  Final   Sputum evaluation   Final    Sputum specimen not acceptable for testing.  Please recollect.   Results Called to: T. FAIRING, RN AT [place] ON 03/12/17 BY C. JESSUP, MLT. Performed at [facility], [place] [address] [place], [place] [address]    Report Status 03/12/2017 FINAL  Final     SIGNED:   Cordelia Poche, MD [facility] 03/17/2017, 9:28 AM Pager [phone]  If 7PM-7AM, please contact night-coverage [email or web address] Password [email or web address]

## 2017-03-17 NOTE — Discharge Instructions (Signed)
Aspiration Pneumonia °Aspiration pneumonia is an infection in your lungs. It occurs when food, liquid, or stomach contents (vomit) are inhaled (aspirated) into your lungs. When these things get into your lungs, swelling (inflammation) and infection can occur. This can make it difficult for you to breathe. Aspiration pneumonia is a serious condition and can be life threatening. °What increases the risk? °Aspiration pneumonia is more likely to occur when a person's cough (gag) reflex or ability to swallow has been decreased. Some things that can do this include: °· Having a brain injury or disease, such as stroke, seizures, Parkinson's disease, dementia, or amyotrophic lateral sclerosis (ALS). °· Being given general anesthetic for procedures. °· Being in a coma (unconscious). °· Having a narrowing of the tube that carries food to the stomach (esophagus). °· Drinking too much alcohol. If a person passes out and vomits, vomit can be swallowed into the lungs. °· Taking certain medicines, such as tranquilizers or sedatives. ° °What are the signs or symptoms? °· Coughing after swallowing food or liquids. °· Breathing problems, such as wheezing or shortness of breath. °· Bluish skin. This can be caused by lack of oxygen. °· Coughing up food or mucus. The mucus might contain blood, greenish material, or yellowish-white fluid (pus). °· Fever. °· Chest pain. °· Being more tired than usual (fatigue). °· Sweating more than usual. °· Bad breath. °How is this diagnosed? °A physical exam will be done. During the exam, the health care provider will listen to your lungs with a stethoscope to check for: °· Crackling sounds in the lungs. °· Decreased breath sounds. °· A rapid heartbeat. ° °Various tests may be ordered. These may include: °· Chest X-ray. °· CT scan. °· Swallowing study. This test looks at how food is swallowed and whether it goes into your breathing tube (trachea) or food pipe (esophagus). °· Sputum culture. Saliva and  mucus (sputum) are collected from the lungs or the tubes that carry air to the lungs (bronchi). The sputum is then tested for bacteria. °· Bronchoscopy. This test uses a flexible tube (bronchoscope) to see inside the lungs. ° °How is this treated? °Treatment will usually include antibiotic medicines. Other medicines may also be used to reduce fever or pain. You may need to be treated in the hospital. In the hospital, your breathing will be carefully monitored. Depending on how well you are breathing, you may need to be given oxygen, or you may need breathing support from a breathing machine (ventilator). For people who fail a swallowing study, a feeding tube might be placed in the stomach, or they may be asked to avoid certain food textures or liquids when they eat. °Follow these instructions at home: °· Carefully follow any special eating instructions you were given, such as avoiding certain food textures or thickening liquids. This reduces the risk of developing aspiration pneumonia again. °· Only take over-the-counter or prescription medicines as directed by your health care provider. Follow the directions carefully. °· If you were prescribed antibiotics, take them as directed. Finish them even if you start to feel better. °· Rest as instructed by your health care provider. °· Keep all follow-up appointments with your health care provider. °Contact a health care provider if: °· You develop worsening shortness of breath, wheezing, or difficulty breathing. °· You develop a fever. °· You have chest pain. °This information is not intended to replace advice given to you by your health care provider. Make sure you discuss any questions you have with your health care   provider. °Document Released: 11/17/2008 Document Revised: 07/04/2015 Document Reviewed: 07/08/2012 °Elsevier Interactive Patient Education © 2017 [employer]vier Inc. ° °

## 2017-03-18 DIAGNOSIS — D631 Anemia in chronic kidney disease: Secondary | ICD-10-CM | POA: Diagnosis not present

## 2017-03-18 DIAGNOSIS — D509 Iron deficiency anemia, unspecified: Secondary | ICD-10-CM | POA: Diagnosis not present

## 2017-03-18 DIAGNOSIS — Z992 Dependence on renal dialysis: Secondary | ICD-10-CM | POA: Diagnosis not present

## 2017-03-18 DIAGNOSIS — N186 End stage renal disease: Secondary | ICD-10-CM | POA: Diagnosis not present

## 2017-03-18 DIAGNOSIS — N2581 Secondary hyperparathyroidism of renal origin: Secondary | ICD-10-CM | POA: Diagnosis not present

## 2017-03-18 DIAGNOSIS — J158 Pneumonia due to other specified bacteria: Secondary | ICD-10-CM | POA: Diagnosis not present

## 2017-03-20 DIAGNOSIS — Z992 Dependence on renal dialysis: Secondary | ICD-10-CM | POA: Diagnosis not present

## 2017-03-20 DIAGNOSIS — D631 Anemia in chronic kidney disease: Secondary | ICD-10-CM | POA: Diagnosis not present

## 2017-03-20 DIAGNOSIS — N186 End stage renal disease: Secondary | ICD-10-CM | POA: Diagnosis not present

## 2017-03-20 DIAGNOSIS — J158 Pneumonia due to other specified bacteria: Secondary | ICD-10-CM | POA: Diagnosis not present

## 2017-03-20 DIAGNOSIS — N2581 Secondary hyperparathyroidism of renal origin: Secondary | ICD-10-CM | POA: Diagnosis not present

## 2017-03-20 DIAGNOSIS — D509 Iron deficiency anemia, unspecified: Secondary | ICD-10-CM | POA: Diagnosis not present

## 2017-03-23 DIAGNOSIS — D509 Iron deficiency anemia, unspecified: Secondary | ICD-10-CM | POA: Diagnosis not present

## 2017-03-23 DIAGNOSIS — N2581 Secondary hyperparathyroidism of renal origin: Secondary | ICD-10-CM | POA: Diagnosis not present

## 2017-03-23 DIAGNOSIS — D631 Anemia in chronic kidney disease: Secondary | ICD-10-CM | POA: Diagnosis not present

## 2017-03-23 DIAGNOSIS — Z992 Dependence on renal dialysis: Secondary | ICD-10-CM | POA: Diagnosis not present

## 2017-03-23 DIAGNOSIS — N186 End stage renal disease: Secondary | ICD-10-CM | POA: Diagnosis not present

## 2017-03-23 DIAGNOSIS — J158 Pneumonia due to other specified bacteria: Secondary | ICD-10-CM | POA: Diagnosis not present

## 2017-03-27 DIAGNOSIS — N186 End stage renal disease: Secondary | ICD-10-CM | POA: Diagnosis not present

## 2017-03-27 DIAGNOSIS — D509 Iron deficiency anemia, unspecified: Secondary | ICD-10-CM | POA: Diagnosis not present

## 2017-03-27 DIAGNOSIS — Z992 Dependence on renal dialysis: Secondary | ICD-10-CM | POA: Diagnosis not present

## 2017-03-27 DIAGNOSIS — N2581 Secondary hyperparathyroidism of renal origin: Secondary | ICD-10-CM | POA: Diagnosis not present

## 2017-03-27 DIAGNOSIS — J158 Pneumonia due to other specified bacteria: Secondary | ICD-10-CM | POA: Diagnosis not present

## 2017-03-27 DIAGNOSIS — D631 Anemia in chronic kidney disease: Secondary | ICD-10-CM | POA: Diagnosis not present

## 2017-03-30 DIAGNOSIS — D631 Anemia in chronic kidney disease: Secondary | ICD-10-CM | POA: Diagnosis not present

## 2017-03-30 DIAGNOSIS — N2581 Secondary hyperparathyroidism of renal origin: Secondary | ICD-10-CM | POA: Diagnosis not present

## 2017-03-30 DIAGNOSIS — J158 Pneumonia due to other specified bacteria: Secondary | ICD-10-CM | POA: Diagnosis not present

## 2017-03-30 DIAGNOSIS — N186 End stage renal disease: Secondary | ICD-10-CM | POA: Diagnosis not present

## 2017-03-30 DIAGNOSIS — Z992 Dependence on renal dialysis: Secondary | ICD-10-CM | POA: Diagnosis not present

## 2017-03-30 DIAGNOSIS — D509 Iron deficiency anemia, unspecified: Secondary | ICD-10-CM | POA: Diagnosis not present

## 2017-04-01 DIAGNOSIS — D631 Anemia in chronic kidney disease: Secondary | ICD-10-CM | POA: Diagnosis not present

## 2017-04-01 DIAGNOSIS — J158 Pneumonia due to other specified bacteria: Secondary | ICD-10-CM | POA: Diagnosis not present

## 2017-04-01 DIAGNOSIS — Z992 Dependence on renal dialysis: Secondary | ICD-10-CM | POA: Diagnosis not present

## 2017-04-01 DIAGNOSIS — N186 End stage renal disease: Secondary | ICD-10-CM | POA: Diagnosis not present

## 2017-04-01 DIAGNOSIS — N2581 Secondary hyperparathyroidism of renal origin: Secondary | ICD-10-CM | POA: Diagnosis not present

## 2017-04-01 DIAGNOSIS — D509 Iron deficiency anemia, unspecified: Secondary | ICD-10-CM | POA: Diagnosis not present

## 2017-04-03 DIAGNOSIS — D509 Iron deficiency anemia, unspecified: Secondary | ICD-10-CM | POA: Diagnosis not present

## 2017-04-03 DIAGNOSIS — Z992 Dependence on renal dialysis: Secondary | ICD-10-CM | POA: Diagnosis not present

## 2017-04-03 DIAGNOSIS — N2581 Secondary hyperparathyroidism of renal origin: Secondary | ICD-10-CM | POA: Diagnosis not present

## 2017-04-03 DIAGNOSIS — D631 Anemia in chronic kidney disease: Secondary | ICD-10-CM | POA: Diagnosis not present

## 2017-04-03 DIAGNOSIS — I12 Hypertensive chronic kidney disease with 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: Secondary | ICD-10-CM | POA: Diagnosis not present

## 2017-04-03 DIAGNOSIS — J158 Pneumonia due to other specified bacteria: Secondary | ICD-10-CM | POA: Diagnosis not present

## 2017-04-03 DIAGNOSIS — N186 End stage renal disease: Secondary | ICD-10-CM | POA: Diagnosis not present

## 2017-04-06 DIAGNOSIS — D509 Iron deficiency anemia, unspecified: Secondary | ICD-10-CM | POA: Diagnosis not present

## 2017-04-06 DIAGNOSIS — N2581 Secondary hyperparathyroidism of renal origin: Secondary | ICD-10-CM | POA: Diagnosis not present

## 2017-04-06 DIAGNOSIS — N186 End stage renal disease: Secondary | ICD-10-CM | POA: Diagnosis not present

## 2017-04-06 DIAGNOSIS — D631 Anemia in chronic kidney disease: Secondary | ICD-10-CM | POA: Diagnosis not present

## 2017-04-06 DIAGNOSIS — Z992 Dependence on renal dialysis: Secondary | ICD-10-CM | POA: Diagnosis not present

## 2017-04-06 DIAGNOSIS — J158 Pneumonia due to other specified bacteria: Secondary | ICD-10-CM | POA: Diagnosis not present

## 2017-04-07 DIAGNOSIS — N186 End stage renal disease: Secondary | ICD-10-CM | POA: Diagnosis not present

## 2017-04-07 DIAGNOSIS — R002 Palpitations: Secondary | ICD-10-CM | POA: Diagnosis not present

## 2017-04-07 DIAGNOSIS — Z8701 Personal history of pneumonia (recurrent): Secondary | ICD-10-CM | POA: Diagnosis not present

## 2017-04-07 DIAGNOSIS — I161 Hypertensive emergency: Secondary | ICD-10-CM | POA: Diagnosis not present

## 2017-04-08 DIAGNOSIS — Z992 Dependence on renal dialysis: Secondary | ICD-10-CM | POA: Diagnosis not present

## 2017-04-08 DIAGNOSIS — N186 End stage renal disease: Secondary | ICD-10-CM | POA: Diagnosis not present

## 2017-04-08 DIAGNOSIS — J158 Pneumonia due to other specified bacteria: Secondary | ICD-10-CM | POA: Diagnosis not present

## 2017-04-08 DIAGNOSIS — D509 Iron deficiency anemia, unspecified: Secondary | ICD-10-CM | POA: Diagnosis not present

## 2017-04-08 DIAGNOSIS — N2581 Secondary hyperparathyroidism of renal origin: Secondary | ICD-10-CM | POA: Diagnosis not present

## 2017-04-08 DIAGNOSIS — D631 Anemia in chronic kidney disease: Secondary | ICD-10-CM | POA: Diagnosis not present

## 2017-04-10 DIAGNOSIS — Z992 Dependence on renal dialysis: Secondary | ICD-10-CM | POA: Diagnosis not present

## 2017-04-10 DIAGNOSIS — N2581 Secondary hyperparathyroidism of renal origin: Secondary | ICD-10-CM | POA: Diagnosis not present

## 2017-04-10 DIAGNOSIS — D631 Anemia in chronic kidney disease: Secondary | ICD-10-CM | POA: Diagnosis not present

## 2017-04-10 DIAGNOSIS — N186 End stage renal disease: Secondary | ICD-10-CM | POA: Diagnosis not present

## 2017-04-10 DIAGNOSIS — D509 Iron deficiency anemia, unspecified: Secondary | ICD-10-CM | POA: Diagnosis not present

## 2017-04-10 DIAGNOSIS — J158 Pneumonia due to other specified bacteria: Secondary | ICD-10-CM | POA: Diagnosis not present

## 2017-04-13 DIAGNOSIS — N2581 Secondary hyperparathyroidism of renal origin: Secondary | ICD-10-CM | POA: Diagnosis not present

## 2017-04-13 DIAGNOSIS — D631 Anemia in chronic kidney disease: Secondary | ICD-10-CM | POA: Diagnosis not present

## 2017-04-13 DIAGNOSIS — Z992 Dependence on renal dialysis: Secondary | ICD-10-CM | POA: Diagnosis not present

## 2017-04-13 DIAGNOSIS — D509 Iron deficiency anemia, unspecified: Secondary | ICD-10-CM | POA: Diagnosis not present

## 2017-04-13 DIAGNOSIS — J158 Pneumonia due to other specified bacteria: Secondary | ICD-10-CM | POA: Diagnosis not present

## 2017-04-13 DIAGNOSIS — N186 End stage renal disease: Secondary | ICD-10-CM | POA: Diagnosis not present

## 2017-04-15 DIAGNOSIS — J158 Pneumonia due to other specified bacteria: Secondary | ICD-10-CM | POA: Diagnosis not present

## 2017-04-15 DIAGNOSIS — N186 End stage renal disease: Secondary | ICD-10-CM | POA: Diagnosis not present

## 2017-04-15 DIAGNOSIS — Z992 Dependence on renal dialysis: Secondary | ICD-10-CM | POA: Diagnosis not present

## 2017-04-15 DIAGNOSIS — D509 Iron deficiency anemia, unspecified: Secondary | ICD-10-CM | POA: Diagnosis not present

## 2017-04-15 DIAGNOSIS — D631 Anemia in chronic kidney disease: Secondary | ICD-10-CM | POA: Diagnosis not present

## 2017-04-15 DIAGNOSIS — N2581 Secondary hyperparathyroidism of renal origin: Secondary | ICD-10-CM | POA: Diagnosis not present

## 2017-04-17 DIAGNOSIS — Z992 Dependence on renal dialysis: Secondary | ICD-10-CM | POA: Diagnosis not present

## 2017-04-17 DIAGNOSIS — D509 Iron deficiency anemia, unspecified: Secondary | ICD-10-CM | POA: Diagnosis not present

## 2017-04-17 DIAGNOSIS — D631 Anemia in chronic kidney disease: Secondary | ICD-10-CM | POA: Diagnosis not present

## 2017-04-17 DIAGNOSIS — N186 End stage renal disease: Secondary | ICD-10-CM | POA: Diagnosis not present

## 2017-04-17 DIAGNOSIS — N2581 Secondary hyperparathyroidism of renal origin: Secondary | ICD-10-CM | POA: Diagnosis not present

## 2017-04-17 DIAGNOSIS — J158 Pneumonia due to other specified bacteria: Secondary | ICD-10-CM | POA: Diagnosis not present

## 2017-04-20 DIAGNOSIS — N186 End stage renal disease: Secondary | ICD-10-CM | POA: Diagnosis not present

## 2017-04-20 DIAGNOSIS — D631 Anemia in chronic kidney disease: Secondary | ICD-10-CM | POA: Diagnosis not present

## 2017-04-20 DIAGNOSIS — Z992 Dependence on renal dialysis: Secondary | ICD-10-CM | POA: Diagnosis not present

## 2017-04-20 DIAGNOSIS — N2581 Secondary hyperparathyroidism of renal origin: Secondary | ICD-10-CM | POA: Diagnosis not present

## 2017-04-20 DIAGNOSIS — D509 Iron deficiency anemia, unspecified: Secondary | ICD-10-CM | POA: Diagnosis not present

## 2017-04-20 DIAGNOSIS — J158 Pneumonia due to other specified bacteria: Secondary | ICD-10-CM | POA: Diagnosis not present

## 2017-04-22 DIAGNOSIS — J158 Pneumonia due to other specified bacteria: Secondary | ICD-10-CM | POA: Diagnosis not present

## 2017-04-22 DIAGNOSIS — N2581 Secondary hyperparathyroidism of renal origin: Secondary | ICD-10-CM | POA: Diagnosis not present

## 2017-04-22 DIAGNOSIS — Z992 Dependence on renal dialysis: Secondary | ICD-10-CM | POA: Diagnosis not present

## 2017-04-22 DIAGNOSIS — D631 Anemia in chronic kidney disease: Secondary | ICD-10-CM | POA: Diagnosis not present

## 2017-04-22 DIAGNOSIS — D509 Iron deficiency anemia, unspecified: Secondary | ICD-10-CM | POA: Diagnosis not present

## 2017-04-22 DIAGNOSIS — N186 End stage renal disease: Secondary | ICD-10-CM | POA: Diagnosis not present

## 2017-04-23 ENCOUNTER — Encounter: Payer: Self-pay | Admitting: Cardiology

## 2017-04-23 ENCOUNTER — Ambulatory Visit (INDEPENDENT_AMBULATORY_CARE_PROVIDER_SITE_OTHER): Payer: Medicare Other | Admitting: Cardiology

## 2017-04-23 ENCOUNTER — Encounter (INDEPENDENT_AMBULATORY_CARE_PROVIDER_SITE_OTHER): Payer: Self-pay

## 2017-04-23 VITALS — BP 168/110 | HR 69 | Ht 65.0 in | Wt 198.6 lb

## 2017-04-23 DIAGNOSIS — R0609 Other forms of dyspnea: Secondary | ICD-10-CM | POA: Diagnosis not present

## 2017-04-23 DIAGNOSIS — I10 Essential (primary) hypertension: Secondary | ICD-10-CM | POA: Diagnosis not present

## 2017-04-23 DIAGNOSIS — R002 Palpitations: Secondary | ICD-10-CM | POA: Diagnosis not present

## 2017-04-23 DIAGNOSIS — N186 End stage renal disease: Secondary | ICD-10-CM

## 2017-04-23 DIAGNOSIS — R0600 Dyspnea, unspecified: Secondary | ICD-10-CM

## 2017-04-23 MED ORDER — METOPROLOL SUCCINATE ER 25 MG PO TB24: 25.0000 mg | ORAL_TABLET | Freq: Every day | ORAL | 2 refills | Status: DC

## 2017-04-23 MED ORDER — HYDROXYZINE HCL 25 MG PO TABS: 25.0000 mg | ORAL_TABLET | Freq: Three times a day (TID) | ORAL | 3 refills | Status: DC | PRN

## 2017-04-23 MED ORDER — HYDRALAZINE HCL 25 MG PO TABS: 25.0000 mg | ORAL_TABLET | Freq: Three times a day (TID) | ORAL | 3 refills | Status: DC

## 2017-04-23 NOTE — Patient Instructions (Signed)
Medication Instructions:   START TAKING METOPROLOL SUCCINATE (TOPROL XL) 25 MG ONCE DAILY  START TAKING HYDRALAZINE 25 MG BY MOUTH THREE TIMES DAILY      Follow-Up:  3 WEEKS IN OUR BP CLINIC TO SEE THE PHARMACIST   Your physician wants you to follow-up in: [place] will receive a reminder letter in the mail two months in advance. If you don't receive a letter, please call our office to schedule the follow-up appointment.        If you need a refill on your cardiac medications before your next appointment, please call your pharmacy.

## 2017-04-23 NOTE — Progress Notes (Signed)
Cardiology Office Note    Date:  04/23/2017   ID:  Tu, Bayle 1965-11-21, MRN [id number]  PCP:  Leeroy Cha, MD  Cardiologist:   Ena Dawley, MD   No chief complaint on file.   History of Present Illness:  James Wall is a 52 y.o. 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 The patient also has a diagnosis of liver cirrhosis of unknown etiology. The patient is coming with concerns of fatigue, shortness of breath, and weight loss. The patient states that he has lost significant amount of weight in the last 2 years. He is minimally active as he feels as he has no energy. And he gets short of breath with minimal activity. The patient denies any chest pain. He has also been experiencing palpitations associated with shortness of breath at the end of his dialysis. He was prescribed when necessary metoprolol that he takes toward the end of his hemodialysis session and helps with his symptoms. The patient denies syncope.  09/29/13 - the patient is feeling about the same, he is not complaint with his meds as some of the BP meds and muscle relaxant made him feel dizzy. Consequently he ended up in the ED with hypertensive urgency. I reviewed the list with him.   04/23/2017 - the patient is coming after 3 years, he was recently hospitalized with aspiration pneumonia and sepsis treated with IV vancomycin and cefepime, he was discharged on 03/17/2017. Since the discharge he has lost appetite however he his blood pressure has been significantly elevated. Prior to the admission he was rather hypotensive and his blood pressure medication have been discontinued however now despite strict low-sodium diet remains hypertensive. He denies any chest pain or shortness of breath and no lower extremity edema. No orthopnea or proximal nocturnal dyspnea.  Past Medical History:  Diagnosis  Date  . Anemia   . Anxiety   . Arthritis    GOUT - pt not sure if this is true  . 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 Cirrhosis, nonalcoholic ([place])   . COPD (chronic obstructive pulmonary disease) ([place])   . Depression   . ESRD on hemodialysis [facility])    Started HD Jan 2009.  ESRD was due to HTN.  Dx'd with HTN in hospital 1996 according to pt, they had to keep him so he could get Medicaid to afford the BP medications.  First saw a nephrologist and started HD in the same year 2009.  Gets HD at [facility] on a MWF schedule.  Does not have DM. He had a left RC AVF that never functioned, a left upper arm AVF that worked for about 5 years and as of June  . GERD (gastroesophageal reflux disease)   . Hypertension   . Pneumonia 03/2017  . Renal insufficiency   . Secondary hyperparathyroidism ([place])   . Sepsis ([place]) 02/2013   from AVF , treated with Vancomycin.  Marland Kitchen Shortness of breath    With exertion  . Sleep apnea    no  longer using cpap    Past Surgical History:  Procedure Laterality Date  . AV FISTULA PLACEMENT  2009   Left lower arm AVF  . AV FISTULA PLACEMENT Right 02/22/2013   Procedure:  CREATION  OF BRACHIAL CEPHALIC FISTULA RIGHT ARM;  Surgeon: Elam Dutch, MD;  Location: [place];  Service: Vascular;  Laterality: Right;  . AV  FISTULA PLACEMENT Left 08/10/2014   Procedure: BASILIC VEIN TRANSPOSITION  ARTERIOVENOUS (AV) FISTULA CREATION LEFT UPPER ARM;  Surgeon: Mal Misty, MD;  Location: [place];  Service: Vascular;  Laterality: Left;  . COLONOSCOPY    . ESOPHAGOGASTRODUODENOSCOPY (EGD) WITH PROPOFOL N/A 04/12/2013   Procedure: ESOPHAGOGASTRODUODENOSCOPY (EGD) WITH PROPOFOL;  Surgeon: Arta Silence, MD;  Location: [facility];  Service: Endoscopy;  Laterality: N/A;  . INSERTION OF DIALYSIS CATHETER N/A 12/23/2012   Procedure: INSERTION OF DIALYSIS CATHETER; ULTRASOUND GUIDED;  Surgeon: Angelia Mould, MD;  Location: [facility] OR;  Service: Vascular;  Laterality: N/A;  . INSERTION  OF DIALYSIS CATHETER  10/22/2015   Right IJ non-tunneled HD catheter  . IR GENERIC HISTORICAL  10/22/2015   IR US GUIDE VASC ACCESS RIGHT 10/22/2015 [facility]INTER[facility]  . IR GENERIC HISTORICAL  10/22/2015   IR FLUORO GUIDE CV LINE RIGHT 10/22/2015 [facility]  . IR GENERIC HISTORICAL  10/23/2015   IR FLUORO GUIDE CV LINE RIGHT 10/23/2015 Marybelle Killings, MD [facility]  . LEFT HEART CATHETERIZATION WITH CORONARY ANGIOGRAM N/A 07/13/2013   Procedure: LEFT HEART CATHETERIZATION WITH CORONARY ANGIOGRAM;  Surgeon: Jettie Booze, MD;  Location: [facility] CATH LAB;  Service: Cardiovascular;  Laterality: N/A;  . LIGATION OF ARTERIOVENOUS  FISTULA Left 12/22/2012   Procedure: LIGATION OF ARTERIOVENOUS  FISTULA;EXCISION OF LARGE ANEURYSMS;;  Surgeon: Elam Dutch, MD;  Location: [facility] OR;  Service: Vascular;  Laterality: Left;    Current Medications: Outpatient Medications Prior to Visit  Medication Sig Dispense Refill  . acetaminophen (TYLENOL) 650 MG CR tablet Take 1 tablet (650 mg total) by mouth daily as needed for pain.    Marland Kitchen amLODipine (NORVASC) 10 MG tablet Take 1 tablet (10 mg total) by mouth daily. 30 tablet 0  . cloNIDine (CATAPRES - DOSED IN MG/24 HR) 0.3 mg/24hr Place 0.3 mg onto the skin every Sunday. Saturday.    . metoprolol tartrate (LOPRESSOR) 100 MG tablet Take 100 mg by mouth daily.     Marland Kitchen omeprazole (PRILOSEC) 20 MG capsule Take 20 mg by mouth daily as needed (acid reflux).    . SENSIPAR 60 MG tablet Take 1 tablet (60 mg total) by mouth every Monday, Wednesday, and Friday with hemodialysis.    Marland Kitchen sevelamer carbonate (RENVELA) 800 MG tablet Take 3,200-4,800 mg by mouth See admin instructions. Take 4-6 tablets (3200-4800 mg) by mouth one to two times daily with meals    . tamsulosin (FLOMAX) 0.4 MG CAPS capsule Take 0.4 mg by mouth every other day.   0  . hydrOXYzine (ATARAX/VISTARIL) 25 MG tablet Take 25 mg by mouth every 8 (eight) hours as needed for itching.    Marland Kitchen amoxicillin-clavulanate  (AUGMENTIN) 500-125 MG tablet Take 1 tablet (500 mg total) by mouth daily. (Patient not taking: Reported on 04/23/2017) 1 tablet 0  . atorvastatin (LIPITOR) 20 MG tablet Take 1 tablet (20 mg total) by mouth daily. (Patient not taking: Reported on 03/08/2017) 90 tablet 3  . diphenhydramine-acetaminophen (TYLENOL PM) 25-500 MG TABS tablet Take 1 tablet by mouth at bedtime as needed (sleep). (Patient not taking: Reported on 04/23/2017)    . guaiFENesin (MUCINEX) 600 MG 12 hr tablet Take 2 tablets (1,200 mg total) by mouth 2 (two) times daily. (Patient not taking: Reported on 04/23/2017) 60 tablet 0  . polyethylene glycol (MIRALAX / GLYCOLAX) packet Take 17 g by mouth daily. (Patient not taking: Reported on 04/23/2017) 14 each 0   No facility-administered medications prior to visit.  Allergies:   Aspirin   Social History   Socioeconomic History  . Marital status: Single    Spouse name: Not on file  . Number of children: 0  . Years of education: 12th  . Highest education level: Not on file  Occupational History  . Occupation: n/a  Social Needs  . Financial resource strain: Not on file  . Food insecurity:    Worry: Not on file    Inability: Not on file  . Transportation needs:    Medical: Not on file    Non-medical: Not on file  Tobacco Use  . Smoking status: Former Smoker    Packs/day: 0.50    Years: 16.00    Pack years: 8.00    Types: Cigarettes    Last attempt to quit: 03/03/2017    Years since quitting: 0.1  . Smokeless tobacco: Never Used  Substance and Sexual Activity  . Alcohol use: Yes    Alcohol/week: 0.0 oz    Comment: occassional  . Drug use: No  . Sexual activity: Not on file  Lifestyle  . Physical activity:    Days per week: Not on file    Minutes per session: Not on file  . Stress: Not on file  Relationships  . Social connections:    Talks on phone: Not on file    Gets together: Not on file    Attends religious service: Not on file    Active member of club  or organization: Not on file    Attends meetings of clubs or organizations: Not on file    Relationship status: Not on file  Other Topics Concern  . Not on file  Social History Narrative  . Not on file     Family History:  The patient's family history includes Asthma in his brother; Cerebrovascular Accident in his father; Congestive Heart Failure in his brother; Hypertension in his father and mother.   ROS:   Please see the history of present illness.    ROS All other systems reviewed and are negative.   PHYSICAL EXAM:   VS:  BP (!) 168/110 (BP Location: Right Arm, Patient Position: Sitting, Cuff Size: Large)   Pulse 69   Ht 5' 5"  (1.651 m)   Wt 198 lb 9.6 oz (90.1 kg)   SpO2 98%   BMI 33.05 kg/m    GEN: Well nourished, well developed, in no acute distress  HEENT: normal  Neck: no JVD, carotid bruits, or masses Cardiac: RRR; no murmurs, rubs, or gallops,no edema  Respiratory: dialysis catheter in his right upper chest, clear to auscultation bilaterally, normal work of breathing GI: soft, nontender, nondistended, + BS MS: no deformity or atrophy  Skin: warm and dry, no rash Neuro:  Alert and Oriented x 3, Strength and sensation are intact Psych: euthymic mood, full affect  Wt Readings from Last 3 Encounters:  04/23/17 198 lb 9.6 oz (90.1 kg)  03/17/17 195 lb 3.2 oz (88.5 kg)  12/06/15 185 lb (83.9 kg)      Studies/Labs Reviewed:   EKG:  EKG is ordered today.  The ekg ordered today demonstrates normal sinus rhythm with negative T waves in inferolateral leads unchanged from EKG on 08/10/2014.  Recent Labs: 03/11/2017: ALT 10; Magnesium 2.0 03/16/2017: BUN 53; Creatinine, Ser 17.59; Hemoglobin 7.8; Platelets 296; Potassium 4.6; Sodium 136   Lipid Panel No results found for: CHOL, TRIG, HDL, CHOLHDL, VLDL, LDLCALC, LDLDIRECT  Additional studies/ records that were reviewed today include:  ASSESSMENT:    1. Essential hypertension   2. End stage renal disease (HCC)    3. Heart palpitations   4. DOE (dyspnea on exertion)      PLAN:  In order of problems listed above:  Poorly controlled hypertension and tachycardia that is episodic, I will add Toprol-X 25 mg daily and hydralazine 25 mg by mouth 3 times a day. Will follow-up in 2 weeks for blood pressure management.  Palpitations - associated tachycardia, this is sinus tachycardia. Previously frequent PVCs on Holter monitor. We're adding Toprol-XL 25 mg daily.  Dyspnea on exertion - this has improved. His EKG is unchanged from prior.  Medication Adjustments/Labs and Tests Ordered: Current medicines are reviewed at length with the patient today.  Concerns regarding medicines are outlined above.  Medication changes, Labs and Tests ordered today are listed in the Patient Instructions below. Patient Instructions  Medication Instructions:   START TAKING METOPROLOL SUCCINATE (TOPROL XL) 25 MG ONCE DAILY  START TAKING HYDRALAZINE 25 MG BY MOUTH THREE TIMES DAILY      Follow-Up:  3 WEEKS IN OUR BP CLINIC TO SEE THE PHARMACIST   Your physician wants you to follow-up in: [place] will receive a reminder letter in the mail two months in advance. If you don't receive a letter, please call our office to schedule the follow-up appointment.        If you need a refill on your cardiac medications before your next appointment, please call your pharmacy.      Signed, Ena Dawley, MD  04/23/2017 1:57 PM    [facility] [place], [place], [place]  [address] Phone: [phone]; Fax: [phone]

## 2017-04-24 DIAGNOSIS — D631 Anemia in chronic kidney disease: Secondary | ICD-10-CM | POA: Diagnosis not present

## 2017-04-24 DIAGNOSIS — D509 Iron deficiency anemia, unspecified: Secondary | ICD-10-CM | POA: Diagnosis not present

## 2017-04-24 DIAGNOSIS — Z992 Dependence on renal dialysis: Secondary | ICD-10-CM | POA: Diagnosis not present

## 2017-04-24 DIAGNOSIS — J158 Pneumonia due to other specified bacteria: Secondary | ICD-10-CM | POA: Diagnosis not present

## 2017-04-24 DIAGNOSIS — N2581 Secondary hyperparathyroidism of renal origin: Secondary | ICD-10-CM | POA: Diagnosis not present

## 2017-04-24 DIAGNOSIS — N186 End stage renal disease: Secondary | ICD-10-CM | POA: Diagnosis not present

## 2017-04-27 DIAGNOSIS — Z992 Dependence on renal dialysis: Secondary | ICD-10-CM | POA: Diagnosis not present

## 2017-04-27 DIAGNOSIS — J158 Pneumonia due to other specified bacteria: Secondary | ICD-10-CM | POA: Diagnosis not present

## 2017-04-27 DIAGNOSIS — N186 End stage renal disease: Secondary | ICD-10-CM | POA: Diagnosis not present

## 2017-04-27 DIAGNOSIS — D631 Anemia in chronic kidney disease: Secondary | ICD-10-CM | POA: Diagnosis not present

## 2017-04-27 DIAGNOSIS — D509 Iron deficiency anemia, unspecified: Secondary | ICD-10-CM | POA: Diagnosis not present

## 2017-04-27 DIAGNOSIS — N2581 Secondary hyperparathyroidism of renal origin: Secondary | ICD-10-CM | POA: Diagnosis not present

## 2017-04-29 DIAGNOSIS — N186 End stage renal disease: Secondary | ICD-10-CM | POA: Diagnosis not present

## 2017-04-29 DIAGNOSIS — D509 Iron deficiency anemia, unspecified: Secondary | ICD-10-CM | POA: Diagnosis not present

## 2017-04-29 DIAGNOSIS — N2581 Secondary hyperparathyroidism of renal origin: Secondary | ICD-10-CM | POA: Diagnosis not present

## 2017-04-29 DIAGNOSIS — J158 Pneumonia due to other specified bacteria: Secondary | ICD-10-CM | POA: Diagnosis not present

## 2017-04-29 DIAGNOSIS — Z992 Dependence on renal dialysis: Secondary | ICD-10-CM | POA: Diagnosis not present

## 2017-04-29 DIAGNOSIS — D631 Anemia in chronic kidney disease: Secondary | ICD-10-CM | POA: Diagnosis not present

## 2017-05-01 DIAGNOSIS — Z992 Dependence on renal dialysis: Secondary | ICD-10-CM | POA: Diagnosis not present

## 2017-05-01 DIAGNOSIS — N186 End stage renal disease: Secondary | ICD-10-CM | POA: Diagnosis not present

## 2017-05-01 DIAGNOSIS — N2581 Secondary hyperparathyroidism of renal origin: Secondary | ICD-10-CM | POA: Diagnosis not present

## 2017-05-01 DIAGNOSIS — J158 Pneumonia due to other specified bacteria: Secondary | ICD-10-CM | POA: Diagnosis not present

## 2017-05-01 DIAGNOSIS — D509 Iron deficiency anemia, unspecified: Secondary | ICD-10-CM | POA: Diagnosis not present

## 2017-05-01 DIAGNOSIS — D631 Anemia in chronic kidney disease: Secondary | ICD-10-CM | POA: Diagnosis not present

## 2017-05-04 DIAGNOSIS — Z7689 Persons encountering health services in other specified circumstances: Secondary | ICD-10-CM | POA: Diagnosis not present

## 2017-05-04 DIAGNOSIS — D509 Iron deficiency anemia, unspecified: Secondary | ICD-10-CM | POA: Diagnosis not present

## 2017-05-04 DIAGNOSIS — L853 Xerosis cutis: Secondary | ICD-10-CM | POA: Diagnosis not present

## 2017-05-04 DIAGNOSIS — Z992 Dependence on renal dialysis: Secondary | ICD-10-CM | POA: Diagnosis not present

## 2017-05-04 DIAGNOSIS — N186 End stage renal disease: Secondary | ICD-10-CM | POA: Diagnosis not present

## 2017-05-04 DIAGNOSIS — I12 Hypertensive chronic kidney disease with 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: Secondary | ICD-10-CM | POA: Diagnosis not present

## 2017-05-04 DIAGNOSIS — I10 Essential (primary) hypertension: Secondary | ICD-10-CM | POA: Diagnosis not present

## 2017-05-04 DIAGNOSIS — N2581 Secondary hyperparathyroidism of renal origin: Secondary | ICD-10-CM | POA: Diagnosis not present

## 2017-05-04 DIAGNOSIS — J158 Pneumonia due to other specified bacteria: Secondary | ICD-10-CM | POA: Diagnosis not present

## 2017-05-04 DIAGNOSIS — D631 Anemia in chronic kidney disease: Secondary | ICD-10-CM | POA: Diagnosis not present

## 2017-05-04 DIAGNOSIS — J189 Pneumonia, unspecified organism: Secondary | ICD-10-CM | POA: Diagnosis not present

## 2017-05-04 DIAGNOSIS — K625 Hemorrhage of anus and rectum: Secondary | ICD-10-CM | POA: Diagnosis not present

## 2017-05-05 ENCOUNTER — Ambulatory Visit (INDEPENDENT_AMBULATORY_CARE_PROVIDER_SITE_OTHER): Payer: Medicare Other | Admitting: Family Medicine

## 2017-05-05 ENCOUNTER — Encounter: Payer: Self-pay | Admitting: Family Medicine

## 2017-05-05 ENCOUNTER — Other Ambulatory Visit: Payer: Self-pay

## 2017-05-05 VITALS — BP 170/110 | HR 69 | Temp 98.2°F | Ht 65.0 in | Wt 194.0 lb

## 2017-05-05 DIAGNOSIS — K625 Hemorrhage of anus and rectum: Secondary | ICD-10-CM | POA: Diagnosis not present

## 2017-05-05 DIAGNOSIS — L853 Xerosis cutis: Secondary | ICD-10-CM | POA: Insufficient documentation

## 2017-05-05 DIAGNOSIS — Z7689 Persons encountering health services in other specified circumstances: Secondary | ICD-10-CM | POA: Insufficient documentation

## 2017-05-05 DIAGNOSIS — I10 Essential (primary) hypertension: Secondary | ICD-10-CM

## 2017-05-05 DIAGNOSIS — J189 Pneumonia, unspecified organism: Secondary | ICD-10-CM

## 2017-05-05 NOTE — Assessment & Plan Note (Signed)
On cheeks and nose.  He has been using more soap and scrubbing since he noticed it and it seems to be getting worse.    We suggested moisturizing the dry skin instead of scrubbing with soaps which could damage it through abrasion.  If no resolution, can send to dermatology.

## 2017-05-05 NOTE — Assessment & Plan Note (Signed)
Noted during last hospitalization in feb 19, no repeat occurances.  He is aware colonoscopy was suggested and says he will do this when gets finances arranged.  He declines offer to go ahead and refer.  He wants to handle on hiw own.

## 2017-05-05 NOTE — Assessment & Plan Note (Signed)
Establishing care.  No new medication requests.  He says he did see his prior pcp for hospital followup from [facility] and has been cleared of pneumonia by CXR. He has no respiratory or febrile symptoms.

## 2017-05-05 NOTE — Patient Instructions (Signed)
It was a pleasure to see you today! Thank you for choosing [facility] for your primary care. James Wall was seen for establishing care. Come back to the clinic if you have any new concerns, and go to the emergency room if you have life threatening problems.   Today we clarified your meds and problem list.  You mentioned your followup chest xray showed the pnuemonia was resolved and that you would schedule your colonoscopy when it fit your schedule. For the dry skin on your face, try moisturizer and avoiding harsh soaps.  If that doesn't work we can send a referal for dermatology.  Let us know if you need anything  If we did any lab work today, and the results require attention, either me or my nurse will get in touch with you. If everything is normal, you will get a letter in mail and a message via . If you don't hear from [place] in two weeks, please give [place] a call. Otherwise, we look forward to seeing you again at your next visit. If you have any questions or concerns before then, please call the clinic at [phone].  Please bring all your medications to every doctors visit  Sign up for My Chart to have easy access to your labs results, and communication with your Primary care physician.    Please check-out at the front desk before leaving the clinic.    Best,  Dr. Sherene Sires FAMILY MEDICINE RESIDENT - PGY1 05/05/2017 9:08 AM

## 2017-05-05 NOTE — Assessment & Plan Note (Addendum)
170/110 with no symptoms.  This has been a chronic problem for this patient.  He has been adjusting his HTN plan with his nephrologist that he sees 3/wk for HD.  Medical record did not reflect his current metoprolol doses so it was corrected to show BID 100 metoprolol and him not actually having a second 48m dose  We discussed the importance of getting this back under control and agreed to continue working with nephrology

## 2017-05-05 NOTE — Progress Notes (Signed)
    Subjective:  James Wall is a 52 y.o. male who presents to the [facility] today with a chief complaint of establishing care.Marland Kitchen   He did his hospital followup with his prior pcp and now is wanting to change pcp.  His only new physical complaint is dry skin  He has no respiratory complaints, CXR cleared his [facility] from hospital with propr pcp (we do not have records of this).  He has had no repeat of rectal bleeding.  No abdominal/rectal pain.   He knows he was recommended to get a colonoscopy but is wanting to handle that on his own timeframe as he gets finances arranged.  He is ESRD with high blood pressure.  He has had arguments with his nephrologist about his HTN regimen and the determination of dry weight.   Review of Systems  Constitutional: Negative for chills, fever and malaise/fatigue.  HENT: Negative for congestion, nosebleeds and sore throat.   Respiratory: Negative for cough and hemoptysis.   Cardiovascular: Negative for chest pain, palpitations and leg swelling.  Gastrointestinal: Positive for heartburn. Negative for abdominal pain, blood in stool, constipation, diarrhea and vomiting.  Musculoskeletal: Negative for back pain and falls.  Skin: Negative for itching and rash.  Neurological: Negative for speech change, seizures, loss of consciousness and headaches.  Psychiatric/Behavioral: Negative for depression and suicidal ideas.      Objective:  Physical Exam: BP (!) 170/110   Pulse 69   Temp 98.2 F (36.8 C) (Oral)   Ht 5' 5"  (1.651 m)   Wt 194 lb (88 kg)   SpO2 95%   BMI 32.28 kg/m   Gen: NAD, conversing comfortably CV: RRR with no murmurs appreciated Pulm: NWOB, CTAB with no crackles, wheezes, or rhonchi GI: Normal bowel sounds present. Soft, Nontender, Nondistended. MSK: no edema, cyanosis, or clubbing noted Skin: warm, dry Neuro: grossly normal, moves all extremities Psych: Normal affect and thought content *HD cath access on R chest  No results found  for this or any previous visit (from the past 72 hour(s)).   Assessment/Plan:  Essential hypertension 170/110 with no symptoms.  This has been a chronic problem for this patient.  He has been adjusting his HTN plan with his nephrologist that he sees 3/wk for HD.  Medical record did not reflect his current metoprolol doses so it was corrected to show BID 100 metoprolol and him not actually having a second 3m dose  We discussed the importance of getting this back under control and agreed to continue working with nephrology  Blood per rectum Noted during last hospitalization in feb 19, no repeat occurances.  He is aware colonoscopy was suggested and says he will do this when gets finances arranged.  He declines offer to go ahead and refer.  He wants to handle on hiw own.  Establishing care with new doctor, encounter for Establishing care.  No new medication requests.  He says he did see his prior pcp for hospital followup from [facility] and has been cleared of pneumonia by CXR. He has no respiratory or febrile symptoms.  Dry skin On cheeks and nose.  He has been using more soap and scrubbing since he noticed it and it seems to be getting worse.    We suggested moisturizing the dry skin instead of scrubbing with soaps which could damage it through abrasion.  If no resolution, can send to dermatology.   SSherene Sires DO FAMILY MEDICINE RESIDENT - PGY1 05/05/2017 10:43 AM

## 2017-05-06 DIAGNOSIS — D631 Anemia in chronic kidney disease: Secondary | ICD-10-CM | POA: Diagnosis not present

## 2017-05-06 DIAGNOSIS — N186 End stage renal disease: Secondary | ICD-10-CM | POA: Diagnosis not present

## 2017-05-06 DIAGNOSIS — J158 Pneumonia due to other specified bacteria: Secondary | ICD-10-CM | POA: Diagnosis not present

## 2017-05-06 DIAGNOSIS — Z992 Dependence on renal dialysis: Secondary | ICD-10-CM | POA: Diagnosis not present

## 2017-05-06 DIAGNOSIS — D509 Iron deficiency anemia, unspecified: Secondary | ICD-10-CM | POA: Diagnosis not present

## 2017-05-06 DIAGNOSIS — N2581 Secondary hyperparathyroidism of renal origin: Secondary | ICD-10-CM | POA: Diagnosis not present

## 2017-05-08 DIAGNOSIS — N186 End stage renal disease: Secondary | ICD-10-CM | POA: Diagnosis not present

## 2017-05-08 DIAGNOSIS — J158 Pneumonia due to other specified bacteria: Secondary | ICD-10-CM | POA: Diagnosis not present

## 2017-05-08 DIAGNOSIS — D631 Anemia in chronic kidney disease: Secondary | ICD-10-CM | POA: Diagnosis not present

## 2017-05-08 DIAGNOSIS — N2581 Secondary hyperparathyroidism of renal origin: Secondary | ICD-10-CM | POA: Diagnosis not present

## 2017-05-08 DIAGNOSIS — Z992 Dependence on renal dialysis: Secondary | ICD-10-CM | POA: Diagnosis not present

## 2017-05-08 DIAGNOSIS — D509 Iron deficiency anemia, unspecified: Secondary | ICD-10-CM | POA: Diagnosis not present

## 2017-05-11 DIAGNOSIS — Z992 Dependence on renal dialysis: Secondary | ICD-10-CM | POA: Diagnosis not present

## 2017-05-11 DIAGNOSIS — D631 Anemia in chronic kidney disease: Secondary | ICD-10-CM | POA: Diagnosis not present

## 2017-05-11 DIAGNOSIS — N186 End stage renal disease: Secondary | ICD-10-CM | POA: Diagnosis not present

## 2017-05-11 DIAGNOSIS — N2581 Secondary hyperparathyroidism of renal origin: Secondary | ICD-10-CM | POA: Diagnosis not present

## 2017-05-11 DIAGNOSIS — J158 Pneumonia due to other specified bacteria: Secondary | ICD-10-CM | POA: Diagnosis not present

## 2017-05-11 DIAGNOSIS — D509 Iron deficiency anemia, unspecified: Secondary | ICD-10-CM | POA: Diagnosis not present

## 2017-05-13 DIAGNOSIS — D509 Iron deficiency anemia, unspecified: Secondary | ICD-10-CM | POA: Diagnosis not present

## 2017-05-13 DIAGNOSIS — Z992 Dependence on renal dialysis: Secondary | ICD-10-CM | POA: Diagnosis not present

## 2017-05-13 DIAGNOSIS — N2581 Secondary hyperparathyroidism of renal origin: Secondary | ICD-10-CM | POA: Diagnosis not present

## 2017-05-13 DIAGNOSIS — D631 Anemia in chronic kidney disease: Secondary | ICD-10-CM | POA: Diagnosis not present

## 2017-05-13 DIAGNOSIS — N186 End stage renal disease: Secondary | ICD-10-CM | POA: Diagnosis not present

## 2017-05-13 DIAGNOSIS — J158 Pneumonia due to other specified bacteria: Secondary | ICD-10-CM | POA: Diagnosis not present

## 2017-05-15 ENCOUNTER — Telehealth: Payer: Self-pay | Admitting: Cardiology

## 2017-05-15 DIAGNOSIS — D631 Anemia in chronic kidney disease: Secondary | ICD-10-CM | POA: Diagnosis not present

## 2017-05-15 DIAGNOSIS — J158 Pneumonia due to other specified bacteria: Secondary | ICD-10-CM | POA: Diagnosis not present

## 2017-05-15 DIAGNOSIS — Z992 Dependence on renal dialysis: Secondary | ICD-10-CM | POA: Diagnosis not present

## 2017-05-15 DIAGNOSIS — N186 End stage renal disease: Secondary | ICD-10-CM | POA: Diagnosis not present

## 2017-05-15 DIAGNOSIS — N2581 Secondary hyperparathyroidism of renal origin: Secondary | ICD-10-CM | POA: Diagnosis not present

## 2017-05-15 DIAGNOSIS — D509 Iron deficiency anemia, unspecified: Secondary | ICD-10-CM | POA: Diagnosis not present

## 2017-05-15 NOTE — Telephone Encounter (Signed)
Pt is calling to endorse to Dr Meda Coffee and our HTN clinic that he will be having his dialysis facility, kidney MD, and PCP send his recent BP readings and HR values.  Pt states that Dr Meda Coffee placed him on Toprol XL 25 mg po daily and hydralazine 25 mg po TID, on 3/21 and advised that he follow-up with our BP clinic on 05/21/17 to follow-up on this.  Pt states that between his last OV with Dr Meda Coffee, 2 different Providers have changed his BP regimen around, and he is quite confused on what he should and should not be taking.  Pt states that he never picked up his Toprol XL 25 mg tabs up that Dr Meda Coffee ordered, for he states shortly after seeing her, his Kidney MD changed him to Metoprolol tartrate 100 mg po bid. Pt states that he is taking his hydralazine 25 mg po TID, as prescribed by Dr Meda Coffee.  Pt states he wants our office and our BP clinic to ONLY manage his HTN, for all the other providers involved in his care are changing his dose of metoprolol around, and he's physically not benefiting from this, and its confusing him on what the right regimen of metoprolol he should be taking.  Pt states that he will bring all his medications he is taking to his BP Clinic appt with our Pharmacist  next week on 4/18, so that they can concur a safe plan and regimen to help him out with his BP management and HRs.  Pt states that if he can get his med regimen under control, and have our Pharmacist and Dr Meda Coffee manage this issue, then he feels that his dialysis treatments will be more beneficial and his symptoms during and after dialysis will improve.  Advised the pt to make sure that he does bring all his meds with him to his BP clinic appt next week, and have his dialysis and Kidney MD send in his most recent readings, for review at that appt.  Informed the pt that I will send this message to Dr Meda Coffee, as a general FYI.  Advised the pt to continue his current med regimen as it is prescribed to this date, so that he  doesn't take more of a med than he's suppose to.  Pt verbalized understanding and agrees with this plan.

## 2017-05-15 NOTE — Telephone Encounter (Signed)
New Message   Pt c/o BP issue:  1. What are your last 5 BP readings? 135/75 was Wednesday numbers at dialysis.He does not recall what it was today at dialysis. 2. Are you having any other symptoms (ex. Dizziness, headache, blurred vision, passed out)? Pain in neck  3. What is your medication issue? none    STAT if HR is under 50 or over 120 (normal HR is 60-100 beats per minute)  1) What is your heart rate? 104 today at dialysis   2) Do you have a log of your heart rate readings (document readings)? No   3) Do you have any other symptoms?   Patient states that when he gets off dialysis that his HR tends to increase. He said that he will ask where he goes for dialysis to send his BP and HR reading to [place].

## 2017-05-18 DIAGNOSIS — Z992 Dependence on renal dialysis: Secondary | ICD-10-CM | POA: Diagnosis not present

## 2017-05-18 DIAGNOSIS — D631 Anemia in chronic kidney disease: Secondary | ICD-10-CM | POA: Diagnosis not present

## 2017-05-18 DIAGNOSIS — N186 End stage renal disease: Secondary | ICD-10-CM | POA: Diagnosis not present

## 2017-05-18 DIAGNOSIS — D509 Iron deficiency anemia, unspecified: Secondary | ICD-10-CM | POA: Diagnosis not present

## 2017-05-18 DIAGNOSIS — N2581 Secondary hyperparathyroidism of renal origin: Secondary | ICD-10-CM | POA: Diagnosis not present

## 2017-05-18 DIAGNOSIS — J158 Pneumonia due to other specified bacteria: Secondary | ICD-10-CM | POA: Diagnosis not present

## 2017-05-20 DIAGNOSIS — N186 End stage renal disease: Secondary | ICD-10-CM | POA: Diagnosis not present

## 2017-05-20 DIAGNOSIS — D509 Iron deficiency anemia, unspecified: Secondary | ICD-10-CM | POA: Diagnosis not present

## 2017-05-20 DIAGNOSIS — N2581 Secondary hyperparathyroidism of renal origin: Secondary | ICD-10-CM | POA: Diagnosis not present

## 2017-05-20 DIAGNOSIS — Z992 Dependence on renal dialysis: Secondary | ICD-10-CM | POA: Diagnosis not present

## 2017-05-20 DIAGNOSIS — J158 Pneumonia due to other specified bacteria: Secondary | ICD-10-CM | POA: Diagnosis not present

## 2017-05-20 DIAGNOSIS — D631 Anemia in chronic kidney disease: Secondary | ICD-10-CM | POA: Diagnosis not present

## 2017-05-21 ENCOUNTER — Ambulatory Visit (INDEPENDENT_AMBULATORY_CARE_PROVIDER_SITE_OTHER): Payer: Medicare Other | Admitting: Pharmacist

## 2017-05-21 ENCOUNTER — Encounter: Payer: Self-pay | Admitting: Pharmacist

## 2017-05-21 VITALS — BP 138/86 | HR 58

## 2017-05-21 DIAGNOSIS — I10 Essential (primary) hypertension: Secondary | ICD-10-CM | POA: Diagnosis not present

## 2017-05-21 NOTE — Progress Notes (Signed)
Patient ID: James Wall                 DOB: 25-Jun-1965                      MRN: [id number]     HPI: James Wall is a 52 y.o. male patient of Dr. Meda Coffee who presents today for hypertension evaluation. PMH significant for prior medical history of hypertension that subsequently led to end-stage renal disease on renal hemodialysis for the last 6 years. The patient also has a diagnosis of liver cirrhosis of unknown etiology. He has had several changes to his BP medications recently. He reports that he would like for Dr. Meda Coffee and her team to manage his pressures.   He present today for additional BP management. He reports that starting last week his pressures have started to come down after dialysis, but before this his pressure would remain high even after dialysis. He reports that he severely cramps on dialysis if trying to pull fluid. He becomes tearful several times during his visit saying that he is frustrated with his care at the dialysis center. He states that he knows his body and he does not want to live like this any longer. He states he has thought about not going for treatments on days he feels he does not need them due to how he feels and how he is treated.   He reports that over the last several weeks his pressure is 200s/100s on the dialysis machine. He reports that he does not monitor at home. He denies issues with medications. He denies chest pain and SOB. He does endorse a headache that he believes is associated with the higher pressures.   Current HTN meds:  Hydralazine 90m TID (12pm, 3pm, 9-10pm) - he endorses missing the middle dose about 2 times per week.  Amlodipine 159mdaily at night  Clonidine 0.45m66meekly patch Metoprolol tartrate 100m58mD (12pm, 9-10pm)  Previously tried: none  BP goal: <130/80  Family History: Asthma in his brother; Cerebrovascular Accident in his father; Congestive Heart Failure in his brother; Hypertension in his father and  mother.  Social History: former smoker, occasional alcohol  Diet: He eats mostly from home. He avoids salts. He drinks Mt. Dew (2 cans per day), lemonade, tea (has cut back quite a bit and now only has when eating out) and water.   Exercise: No exercise currently due to feeling fatigued.   Home BP readings: does not have cuff, but monitored at dialysis. 218/109, 217/110, 138/90 yesterday, Monday - 218/  Wt Readings from Last 3 Encounters:  05/05/17 194 lb (88 kg)  04/23/17 198 lb 9.6 oz (90.1 kg)  03/17/17 195 lb 3.2 oz (88.5 kg)   BP Readings from Last 3 Encounters:  05/21/17 138/86  05/05/17 (!) 170/110  04/23/17 (!) 168/110   Pulse Readings from Last 3 Encounters:  05/21/17 (!) 58  05/05/17 69  04/23/17 69    Renal function: CrCl cannot be calculated (Patient's most recent lab result is older than the maximum 21 days allowed.).  Past Medical History:  Diagnosis Date  . Anemia   . Anxiety   . Arthritis    GOUT - pt not sure if this is true  . 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 COPD (chronic obstructive pulmonary disease) (HCC)[place]. Depression   . [facility]  ESRD was due to HTN.  Dx'd with HTN in hospital 1996 according to pt, they had to keep him so he could get Medicaid to afford the BP medications.  First saw a nephrologist and started HD in the same year 2009.  Gets HD [facility] on a MWF schedule.  Does not have DM. He had a left RC AVF that never functioned, a left upper arm AVF that worked for about 5 years and as of June  . GERD (gastroesophageal reflux disease)   . Hypertension   . Pneumonia 03/2017  . Renal insufficiency   . Secondary hyperparathyroidism ([place])   . Sepsis ([place]) 02/2013   from AVF , treated with Vancomycin.  Marland Kitchen Shortness of breath    With exertion  . Sleep apnea    no  longer using cpap    Current Outpatient Medications on File Prior to Visit  Medication Sig  Dispense Refill  . acetaminophen (TYLENOL) 650 MG CR tablet Take 1 tablet (650 mg total) by mouth daily as needed for pain.    Marland Kitchen amLODipine (NORVASC) 10 MG tablet Take 1 tablet (10 mg total) by mouth daily. 30 tablet 0  . cloNIDine (CATAPRES - DOSED IN MG/24 HR) 0.3 mg/24hr Place 0.3 mg onto the skin every Sunday. Saturday.    . hydrALAZINE (APRESOLINE) 25 MG tablet Take 1 tablet (25 mg total) by mouth 3 (three) times daily. 270 tablet 3  . hydrOXYzine (ATARAX/VISTARIL) 25 MG tablet Take 1 tablet (25 mg total) by mouth every 8 (eight) hours as needed for itching. Further refills need to come from your PCP. 30 tablet 3  . metoprolol tartrate (LOPRESSOR) 100 MG tablet Take 100 mg by mouth 2 (two) times daily.    Marland Kitchen omeprazole (PRILOSEC) 20 MG capsule Take 20 mg by mouth daily as needed (acid reflux).    . SENSIPAR 60 MG tablet Take 1 tablet (60 mg total) by mouth every Monday, Wednesday, and Friday with hemodialysis.    Marland Kitchen sevelamer carbonate (RENVELA) 800 MG tablet Take 3,200-4,800 mg by mouth See admin instructions. Take 4-6 tablets (3200-4800 mg) by mouth one to two times daily with meals    . tamsulosin (FLOMAX) 0.4 MG CAPS capsule Take 0.4 mg by mouth every other day.   0   No current facility-administered medications on file prior to visit.     Allergies  Allergen Reactions  . Aspirin Other (See Comments)    Made congestion worse    Blood pressure 138/86, pulse (!) 58.   Assessment/Plan: Hypertension: BP today is borderline at goal and much improved from reported measurements. Will continue all medications as prescribed for now. Will contact dialysis center to have BP measurements sent to our office - these are being faxed now. Will await results for need to adjust medications. Discussed that pressures may be elevated during sessions secondary to his stress/anxiety over how he is being managed at center. discussed him potentially pursing treatment at a different center if he is  unsatisfied, but he states he wants to ensure he remain on the MWF schedule.  Have advised patient to take medications as prescribed at be sure to be adherent to middle dose of hydralazine. Follow up in HTN clinic in 4-6 weeks for additional management, sooner if needed.   He requests that Dr. Meda Coffee contact Dr. Joelyn Oms from dialysis center. He states that Dr. Joelyn Oms told him to tell Dr. Meda Coffee to call. Will route message to Dr. Meda Coffee to address  Thank you,  Lelan Pons. Patterson Hammersmith, [facility]  05/21/2017 2:28 PM

## 2017-05-21 NOTE — Patient Instructions (Addendum)
Return for a follow up appointment in 6-8 weeks [phone]  Check your blood pressure at home daily (if able) and keep record of the readings.  Take your BP meds as follows: CONTINUE all medications as prescribed - Be sure to get the middle dose of hydralzine in each day.   Bring all of your meds, your BP cuff and your record of home blood pressures to your next appointment.  Exercise as you're able, try to walk approximately 30 minutes per day.  Keep salt intake to a minimum, especially watch canned and prepared boxed foods.  Eat more fresh fruits and vegetables and fewer canned items.  Avoid eating in fast food restaurants.    HOW TO TAKE YOUR BLOOD PRESSURE: . Rest 5 minutes before taking your blood pressure. .  Don't smoke or drink caffeinated beverages for at least 30 minutes before. . Take your blood pressure before (not after) you eat. . Sit comfortably with your back supported and both feet on the floor (don't cross your legs). . Elevate your arm to heart level on a table or a desk. . Use the proper sized cuff. It should fit smoothly and snugly around your bare upper arm. There should be enough room to slip a fingertip under the cuff. The bottom edge of the cuff should be 1 inch above the crease of the elbow. . Ideally, take 3 measurements at one sitting and record the average.

## 2017-05-22 DIAGNOSIS — N186 End stage renal disease: Secondary | ICD-10-CM | POA: Diagnosis not present

## 2017-05-22 DIAGNOSIS — Z992 Dependence on renal dialysis: Secondary | ICD-10-CM | POA: Diagnosis not present

## 2017-05-22 DIAGNOSIS — J158 Pneumonia due to other specified bacteria: Secondary | ICD-10-CM | POA: Diagnosis not present

## 2017-05-22 DIAGNOSIS — D631 Anemia in chronic kidney disease: Secondary | ICD-10-CM | POA: Diagnosis not present

## 2017-05-22 DIAGNOSIS — N2581 Secondary hyperparathyroidism of renal origin: Secondary | ICD-10-CM | POA: Diagnosis not present

## 2017-05-22 DIAGNOSIS — D509 Iron deficiency anemia, unspecified: Secondary | ICD-10-CM | POA: Diagnosis not present

## 2017-05-25 DIAGNOSIS — E1151 Type 2 diabetes mellitus with diabetic peripheral angiopathy without gangrene: Secondary | ICD-10-CM | POA: Insufficient documentation

## 2017-05-25 DIAGNOSIS — N186 End stage renal disease: Secondary | ICD-10-CM | POA: Diagnosis not present

## 2017-05-25 DIAGNOSIS — Z992 Dependence on renal dialysis: Secondary | ICD-10-CM | POA: Diagnosis not present

## 2017-05-25 DIAGNOSIS — D631 Anemia in chronic kidney disease: Secondary | ICD-10-CM | POA: Diagnosis not present

## 2017-05-25 DIAGNOSIS — J158 Pneumonia due to other specified bacteria: Secondary | ICD-10-CM | POA: Diagnosis not present

## 2017-05-25 DIAGNOSIS — D509 Iron deficiency anemia, unspecified: Secondary | ICD-10-CM | POA: Diagnosis not present

## 2017-05-25 DIAGNOSIS — N2581 Secondary hyperparathyroidism of renal origin: Secondary | ICD-10-CM | POA: Diagnosis not present

## 2017-05-27 DIAGNOSIS — N2581 Secondary hyperparathyroidism of renal origin: Secondary | ICD-10-CM | POA: Diagnosis not present

## 2017-05-27 DIAGNOSIS — N186 End stage renal disease: Secondary | ICD-10-CM | POA: Diagnosis not present

## 2017-05-27 DIAGNOSIS — Z992 Dependence on renal dialysis: Secondary | ICD-10-CM | POA: Diagnosis not present

## 2017-05-27 DIAGNOSIS — J158 Pneumonia due to other specified bacteria: Secondary | ICD-10-CM | POA: Diagnosis not present

## 2017-05-27 DIAGNOSIS — D631 Anemia in chronic kidney disease: Secondary | ICD-10-CM | POA: Diagnosis not present

## 2017-05-27 DIAGNOSIS — R519 Headache, unspecified: Secondary | ICD-10-CM | POA: Insufficient documentation

## 2017-05-27 DIAGNOSIS — D509 Iron deficiency anemia, unspecified: Secondary | ICD-10-CM | POA: Diagnosis not present

## 2017-05-29 DIAGNOSIS — Z992 Dependence on renal dialysis: Secondary | ICD-10-CM | POA: Diagnosis not present

## 2017-05-29 DIAGNOSIS — D631 Anemia in chronic kidney disease: Secondary | ICD-10-CM | POA: Diagnosis not present

## 2017-05-29 DIAGNOSIS — N186 End stage renal disease: Secondary | ICD-10-CM | POA: Diagnosis not present

## 2017-05-29 DIAGNOSIS — N2581 Secondary hyperparathyroidism of renal origin: Secondary | ICD-10-CM | POA: Diagnosis not present

## 2017-05-29 DIAGNOSIS — D509 Iron deficiency anemia, unspecified: Secondary | ICD-10-CM | POA: Diagnosis not present

## 2017-05-29 DIAGNOSIS — J158 Pneumonia due to other specified bacteria: Secondary | ICD-10-CM | POA: Diagnosis not present

## 2017-06-01 DIAGNOSIS — Z992 Dependence on renal dialysis: Secondary | ICD-10-CM | POA: Diagnosis not present

## 2017-06-01 DIAGNOSIS — D631 Anemia in chronic kidney disease: Secondary | ICD-10-CM | POA: Diagnosis not present

## 2017-06-01 DIAGNOSIS — N2581 Secondary hyperparathyroidism of renal origin: Secondary | ICD-10-CM | POA: Diagnosis not present

## 2017-06-01 DIAGNOSIS — N186 End stage renal disease: Secondary | ICD-10-CM | POA: Diagnosis not present

## 2017-06-01 DIAGNOSIS — D509 Iron deficiency anemia, unspecified: Secondary | ICD-10-CM | POA: Diagnosis not present

## 2017-06-01 DIAGNOSIS — J158 Pneumonia due to other specified bacteria: Secondary | ICD-10-CM | POA: Diagnosis not present

## 2017-06-03 DIAGNOSIS — N186 End stage renal disease: Secondary | ICD-10-CM | POA: Diagnosis not present

## 2017-06-03 DIAGNOSIS — J158 Pneumonia due to other specified bacteria: Secondary | ICD-10-CM | POA: Diagnosis not present

## 2017-06-03 DIAGNOSIS — D509 Iron deficiency anemia, unspecified: Secondary | ICD-10-CM | POA: Diagnosis not present

## 2017-06-03 DIAGNOSIS — I12 Hypertensive chronic kidney disease with 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: Secondary | ICD-10-CM | POA: Diagnosis not present

## 2017-06-03 DIAGNOSIS — N2581 Secondary hyperparathyroidism of renal origin: Secondary | ICD-10-CM | POA: Diagnosis not present

## 2017-06-03 DIAGNOSIS — Z992 Dependence on renal dialysis: Secondary | ICD-10-CM | POA: Diagnosis not present

## 2017-06-03 DIAGNOSIS — D631 Anemia in chronic kidney disease: Secondary | ICD-10-CM | POA: Diagnosis not present

## 2017-06-05 DIAGNOSIS — Z992 Dependence on renal dialysis: Secondary | ICD-10-CM | POA: Diagnosis not present

## 2017-06-05 DIAGNOSIS — D509 Iron deficiency anemia, unspecified: Secondary | ICD-10-CM | POA: Diagnosis not present

## 2017-06-05 DIAGNOSIS — D631 Anemia in chronic kidney disease: Secondary | ICD-10-CM | POA: Diagnosis not present

## 2017-06-05 DIAGNOSIS — J158 Pneumonia due to other specified bacteria: Secondary | ICD-10-CM | POA: Diagnosis not present

## 2017-06-05 DIAGNOSIS — N2581 Secondary hyperparathyroidism of renal origin: Secondary | ICD-10-CM | POA: Diagnosis not present

## 2017-06-05 DIAGNOSIS — N186 End stage renal disease: Secondary | ICD-10-CM | POA: Diagnosis not present

## 2017-06-08 DIAGNOSIS — N186 End stage renal disease: Secondary | ICD-10-CM | POA: Diagnosis not present

## 2017-06-08 DIAGNOSIS — N2581 Secondary hyperparathyroidism of renal origin: Secondary | ICD-10-CM | POA: Diagnosis not present

## 2017-06-08 DIAGNOSIS — D631 Anemia in chronic kidney disease: Secondary | ICD-10-CM | POA: Diagnosis not present

## 2017-06-08 DIAGNOSIS — D509 Iron deficiency anemia, unspecified: Secondary | ICD-10-CM | POA: Diagnosis not present

## 2017-06-08 DIAGNOSIS — Z992 Dependence on renal dialysis: Secondary | ICD-10-CM | POA: Diagnosis not present

## 2017-06-08 DIAGNOSIS — J158 Pneumonia due to other specified bacteria: Secondary | ICD-10-CM | POA: Diagnosis not present

## 2017-06-10 DIAGNOSIS — Z992 Dependence on renal dialysis: Secondary | ICD-10-CM | POA: Diagnosis not present

## 2017-06-10 DIAGNOSIS — D509 Iron deficiency anemia, unspecified: Secondary | ICD-10-CM | POA: Diagnosis not present

## 2017-06-10 DIAGNOSIS — J158 Pneumonia due to other specified bacteria: Secondary | ICD-10-CM | POA: Diagnosis not present

## 2017-06-10 DIAGNOSIS — D631 Anemia in chronic kidney disease: Secondary | ICD-10-CM | POA: Diagnosis not present

## 2017-06-10 DIAGNOSIS — N186 End stage renal disease: Secondary | ICD-10-CM | POA: Diagnosis not present

## 2017-06-10 DIAGNOSIS — N2581 Secondary hyperparathyroidism of renal origin: Secondary | ICD-10-CM | POA: Diagnosis not present

## 2017-06-12 DIAGNOSIS — N2581 Secondary hyperparathyroidism of renal origin: Secondary | ICD-10-CM | POA: Diagnosis not present

## 2017-06-12 DIAGNOSIS — J158 Pneumonia due to other specified bacteria: Secondary | ICD-10-CM | POA: Diagnosis not present

## 2017-06-12 DIAGNOSIS — N186 End stage renal disease: Secondary | ICD-10-CM | POA: Diagnosis not present

## 2017-06-12 DIAGNOSIS — D631 Anemia in chronic kidney disease: Secondary | ICD-10-CM | POA: Diagnosis not present

## 2017-06-12 DIAGNOSIS — Z992 Dependence on renal dialysis: Secondary | ICD-10-CM | POA: Diagnosis not present

## 2017-06-12 DIAGNOSIS — D509 Iron deficiency anemia, unspecified: Secondary | ICD-10-CM | POA: Diagnosis not present

## 2017-06-15 DIAGNOSIS — N2581 Secondary hyperparathyroidism of renal origin: Secondary | ICD-10-CM | POA: Diagnosis not present

## 2017-06-15 DIAGNOSIS — Z992 Dependence on renal dialysis: Secondary | ICD-10-CM | POA: Diagnosis not present

## 2017-06-15 DIAGNOSIS — D631 Anemia in chronic kidney disease: Secondary | ICD-10-CM | POA: Diagnosis not present

## 2017-06-15 DIAGNOSIS — N186 End stage renal disease: Secondary | ICD-10-CM | POA: Diagnosis not present

## 2017-06-15 DIAGNOSIS — J158 Pneumonia due to other specified bacteria: Secondary | ICD-10-CM | POA: Diagnosis not present

## 2017-06-15 DIAGNOSIS — D509 Iron deficiency anemia, unspecified: Secondary | ICD-10-CM | POA: Diagnosis not present

## 2017-06-16 ENCOUNTER — Telehealth: Payer: Self-pay | Admitting: Pharmacist

## 2017-06-16 NOTE — Telephone Encounter (Signed)
Records of BP from dialysis received. These are mostly from beginning of April and pressures vary widely with pre dialysis pressures as high as 200s/110s and as low as 150s/80s. Post dialysis pressures tend to be about 10-18mHg lower than predialysis. At his last visit he does state that he has strong emotions and is often worked up when going to dialysis and we discussed that this stress and anxiety is likely contributing to his pressures being elevated. His pressure at his last visit in our office was borderline at goal. Will not change any medications for now and see how he is doing at follow up appt.

## 2017-06-17 DIAGNOSIS — D509 Iron deficiency anemia, unspecified: Secondary | ICD-10-CM | POA: Diagnosis not present

## 2017-06-17 DIAGNOSIS — D631 Anemia in chronic kidney disease: Secondary | ICD-10-CM | POA: Diagnosis not present

## 2017-06-17 DIAGNOSIS — N2581 Secondary hyperparathyroidism of renal origin: Secondary | ICD-10-CM | POA: Diagnosis not present

## 2017-06-17 DIAGNOSIS — N186 End stage renal disease: Secondary | ICD-10-CM | POA: Diagnosis not present

## 2017-06-17 DIAGNOSIS — J158 Pneumonia due to other specified bacteria: Secondary | ICD-10-CM | POA: Diagnosis not present

## 2017-06-17 DIAGNOSIS — Z992 Dependence on renal dialysis: Secondary | ICD-10-CM | POA: Diagnosis not present

## 2017-06-19 DIAGNOSIS — D631 Anemia in chronic kidney disease: Secondary | ICD-10-CM | POA: Diagnosis not present

## 2017-06-19 DIAGNOSIS — N2581 Secondary hyperparathyroidism of renal origin: Secondary | ICD-10-CM | POA: Diagnosis not present

## 2017-06-19 DIAGNOSIS — D509 Iron deficiency anemia, unspecified: Secondary | ICD-10-CM | POA: Diagnosis not present

## 2017-06-19 DIAGNOSIS — Z992 Dependence on renal dialysis: Secondary | ICD-10-CM | POA: Diagnosis not present

## 2017-06-19 DIAGNOSIS — N186 End stage renal disease: Secondary | ICD-10-CM | POA: Diagnosis not present

## 2017-06-19 DIAGNOSIS — J158 Pneumonia due to other specified bacteria: Secondary | ICD-10-CM | POA: Diagnosis not present

## 2017-06-22 DIAGNOSIS — N186 End stage renal disease: Secondary | ICD-10-CM | POA: Diagnosis not present

## 2017-06-22 DIAGNOSIS — N2581 Secondary hyperparathyroidism of renal origin: Secondary | ICD-10-CM | POA: Diagnosis not present

## 2017-06-22 DIAGNOSIS — D509 Iron deficiency anemia, unspecified: Secondary | ICD-10-CM | POA: Diagnosis not present

## 2017-06-22 DIAGNOSIS — Z992 Dependence on renal dialysis: Secondary | ICD-10-CM | POA: Diagnosis not present

## 2017-06-22 DIAGNOSIS — J158 Pneumonia due to other specified bacteria: Secondary | ICD-10-CM | POA: Diagnosis not present

## 2017-06-22 DIAGNOSIS — D631 Anemia in chronic kidney disease: Secondary | ICD-10-CM | POA: Diagnosis not present

## 2017-06-24 DIAGNOSIS — Z992 Dependence on renal dialysis: Secondary | ICD-10-CM | POA: Diagnosis not present

## 2017-06-24 DIAGNOSIS — J158 Pneumonia due to other specified bacteria: Secondary | ICD-10-CM | POA: Diagnosis not present

## 2017-06-24 DIAGNOSIS — N2581 Secondary hyperparathyroidism of renal origin: Secondary | ICD-10-CM | POA: Diagnosis not present

## 2017-06-24 DIAGNOSIS — D631 Anemia in chronic kidney disease: Secondary | ICD-10-CM | POA: Diagnosis not present

## 2017-06-24 DIAGNOSIS — N186 End stage renal disease: Secondary | ICD-10-CM | POA: Diagnosis not present

## 2017-06-24 DIAGNOSIS — D509 Iron deficiency anemia, unspecified: Secondary | ICD-10-CM | POA: Diagnosis not present

## 2017-06-26 DIAGNOSIS — N2581 Secondary hyperparathyroidism of renal origin: Secondary | ICD-10-CM | POA: Diagnosis not present

## 2017-06-26 DIAGNOSIS — N186 End stage renal disease: Secondary | ICD-10-CM | POA: Diagnosis not present

## 2017-06-26 DIAGNOSIS — Z992 Dependence on renal dialysis: Secondary | ICD-10-CM | POA: Diagnosis not present

## 2017-06-26 DIAGNOSIS — D631 Anemia in chronic kidney disease: Secondary | ICD-10-CM | POA: Diagnosis not present

## 2017-06-26 DIAGNOSIS — J158 Pneumonia due to other specified bacteria: Secondary | ICD-10-CM | POA: Diagnosis not present

## 2017-06-26 DIAGNOSIS — D509 Iron deficiency anemia, unspecified: Secondary | ICD-10-CM | POA: Diagnosis not present

## 2017-06-29 DIAGNOSIS — N2581 Secondary hyperparathyroidism of renal origin: Secondary | ICD-10-CM | POA: Diagnosis not present

## 2017-06-29 DIAGNOSIS — J158 Pneumonia due to other specified bacteria: Secondary | ICD-10-CM | POA: Diagnosis not present

## 2017-06-29 DIAGNOSIS — D509 Iron deficiency anemia, unspecified: Secondary | ICD-10-CM | POA: Diagnosis not present

## 2017-06-29 DIAGNOSIS — D631 Anemia in chronic kidney disease: Secondary | ICD-10-CM | POA: Diagnosis not present

## 2017-06-29 DIAGNOSIS — N186 End stage renal disease: Secondary | ICD-10-CM | POA: Diagnosis not present

## 2017-06-29 DIAGNOSIS — Z992 Dependence on renal dialysis: Secondary | ICD-10-CM | POA: Diagnosis not present

## 2017-07-01 DIAGNOSIS — D631 Anemia in chronic kidney disease: Secondary | ICD-10-CM | POA: Diagnosis not present

## 2017-07-01 DIAGNOSIS — J158 Pneumonia due to other specified bacteria: Secondary | ICD-10-CM | POA: Diagnosis not present

## 2017-07-01 DIAGNOSIS — Z992 Dependence on renal dialysis: Secondary | ICD-10-CM | POA: Diagnosis not present

## 2017-07-01 DIAGNOSIS — N2581 Secondary hyperparathyroidism of renal origin: Secondary | ICD-10-CM | POA: Diagnosis not present

## 2017-07-01 DIAGNOSIS — D509 Iron deficiency anemia, unspecified: Secondary | ICD-10-CM | POA: Diagnosis not present

## 2017-07-01 DIAGNOSIS — N186 End stage renal disease: Secondary | ICD-10-CM | POA: Diagnosis not present

## 2017-07-01 NOTE — Progress Notes (Deleted)
Patient ID: James Wall                 DOB: 1965-03-09                      MRN: [id number]     HPI: James Wall is a 52 y.o. male patient of Dr. Meda Coffee who presents today for hypertension evaluation. PMH significant for prior medical history of hypertension that subsequently led to end-stage renal disease on renal hemodialysis for the last 6 years. The patient also has a diagnosis of liver cirrhosis of unknown etiology. He has had several changes to his BP medications recently. He reports that he would like for Dr. Meda Coffee and her team to manage his pressures. At his most recent OV no medication changes were made as his pressure was borderline at goal. He also reported elevated pressures at dialysis likely secondary to stress with staff at dailysis center and treatment. This was confirmed when records were eventually obtained.   He present today for BP follow up.     Current HTN meds:  Hydralazine 6m TID (12pm, 3pm, 9-10pm)  Amlodipine 145mdaily at night  Clonidine 0.36m6meekly patch Metoprolol tartrate 100m24mD (12pm, 9-10pm)  Previously tried: none  BP goal: <130/80  Family History: Asthma in his brother; Cerebrovascular Accident in his father; Congestive Heart Failure in his brother; Hypertension in his father and mother.  Social History: former smoker, occasional alcohol  Diet: He eats mostly from home. He avoids salts. He drinks Mt. Dew (2 cans per day), lemonade, tea (has cut back quite a bit and now only has when eating out) and water.   Exercise: No exercise currently due to feeling fatigued.   Home BP readings: does not have home cuff. Monitored at dialysis  Wt Readings from Last 3 Encounters:  05/05/17 194 lb (88 kg)  04/23/17 198 lb 9.6 oz (90.1 kg)  03/17/17 195 lb 3.2 oz (88.5 kg)   BP Readings from Last 3 Encounters:  05/21/17 138/86  05/05/17 (!) 170/110  04/23/17 (!) 168/110   Pulse Readings from Last 3 Encounters:  05/21/17 (!) 58    05/05/17 69  04/23/17 69    Renal function: CrCl cannot be calculated (Patient's most recent lab result is older than the maximum 21 days allowed.).  Past Medical History:  Diagnosis Date  . Anemia   . Anxiety   . Arthritis    GOUT - pt not sure if this is true  . 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 COPD (chronic obstructive pulmonary disease) (HCC)[place]. Depression   . [facility]  ESRD was due to HTN.  Dx'd with HTN in hospital 1996 according to pt, they had to keep him so he could get Medicaid to afford the BP medications.  First saw a nephrologist and started HD in the same year 2009.  [facility]  Does not have DM. He had a left RC AVF that never functioned, a left upper arm AVF that worked for about 5 years and as of June  . GERD (gastroesophageal reflux disease)   . Hypertension   . Pneumonia 03/2017  . Renal insufficiency   . Secondary hyperparathyroidism (HCC)[place]. Sepsis (HCC)[place]2015   from AVF , treated with Vancomycin.  . ShMarland Kitchenrtness of breath    With  exertion  . Sleep apnea    no  longer using cpap    Current Outpatient Medications on File Prior to Visit  Medication Sig Dispense Refill  . acetaminophen (TYLENOL) 650 MG CR tablet Take 1 tablet (650 mg total) by mouth daily as needed for pain.    Marland Kitchen amLODipine (NORVASC) 10 MG tablet Take 1 tablet (10 mg total) by mouth daily. 30 tablet 0  . cloNIDine (CATAPRES - DOSED IN MG/24 HR) 0.3 mg/24hr Place 0.3 mg onto the skin every Sunday. Saturday.    . hydrALAZINE (APRESOLINE) 25 MG tablet Take 1 tablet (25 mg total) by mouth 3 (three) times daily. 270 tablet 3  . hydrOXYzine (ATARAX/VISTARIL) 25 MG tablet Take 1 tablet (25 mg total) by mouth every 8 (eight) hours as needed for itching. Further refills need to come from your PCP. 30 tablet 3  . metoprolol tartrate (LOPRESSOR) 100 MG tablet Take 100 mg by mouth 2 (two) times  daily.    Marland Kitchen omeprazole (PRILOSEC) 20 MG capsule Take 20 mg by mouth daily as needed (acid reflux).    . SENSIPAR 60 MG tablet Take 1 tablet (60 mg total) by mouth every Monday, Wednesday, and Friday with hemodialysis.    Marland Kitchen sevelamer carbonate (RENVELA) 800 MG tablet Take 3,200-4,800 mg by mouth See admin instructions. Take 4-6 tablets (3200-4800 mg) by mouth one to two times daily with meals    . tamsulosin (FLOMAX) 0.4 MG CAPS capsule Take 0.4 mg by mouth every other day.   0   No current facility-administered medications on file prior to visit.     Allergies  Allergen Reactions  . Aspirin Other (See Comments)    Made congestion worse    There were no vitals taken for this visit.   Assessment/Plan: Hypertension: increase hydralazine  Thank you, Lelan Pons. Patterson Hammersmith, [facility]  07/01/2017 7:28 AM

## 2017-07-02 ENCOUNTER — Ambulatory Visit: Payer: Medicare Other

## 2017-07-03 DIAGNOSIS — Z992 Dependence on renal dialysis: Secondary | ICD-10-CM | POA: Diagnosis not present

## 2017-07-03 DIAGNOSIS — D509 Iron deficiency anemia, unspecified: Secondary | ICD-10-CM | POA: Diagnosis not present

## 2017-07-03 DIAGNOSIS — J158 Pneumonia due to other specified bacteria: Secondary | ICD-10-CM | POA: Diagnosis not present

## 2017-07-03 DIAGNOSIS — D631 Anemia in chronic kidney disease: Secondary | ICD-10-CM | POA: Diagnosis not present

## 2017-07-03 DIAGNOSIS — N2581 Secondary hyperparathyroidism of renal origin: Secondary | ICD-10-CM | POA: Diagnosis not present

## 2017-07-03 DIAGNOSIS — N186 End stage renal disease: Secondary | ICD-10-CM | POA: Diagnosis not present

## 2017-07-04 DIAGNOSIS — I12 Hypertensive chronic kidney disease with 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: Secondary | ICD-10-CM | POA: Diagnosis not present

## 2017-07-04 DIAGNOSIS — Z992 Dependence on renal dialysis: Secondary | ICD-10-CM | POA: Diagnosis not present

## 2017-07-04 DIAGNOSIS — N186 End stage renal disease: Secondary | ICD-10-CM | POA: Diagnosis not present

## 2017-07-06 DIAGNOSIS — N186 End stage renal disease: Secondary | ICD-10-CM | POA: Diagnosis not present

## 2017-07-06 DIAGNOSIS — J158 Pneumonia due to other specified bacteria: Secondary | ICD-10-CM | POA: Diagnosis not present

## 2017-07-06 DIAGNOSIS — D509 Iron deficiency anemia, unspecified: Secondary | ICD-10-CM | POA: Diagnosis not present

## 2017-07-06 DIAGNOSIS — N2581 Secondary hyperparathyroidism of renal origin: Secondary | ICD-10-CM | POA: Diagnosis not present

## 2017-07-06 DIAGNOSIS — Z992 Dependence on renal dialysis: Secondary | ICD-10-CM | POA: Diagnosis not present

## 2017-07-06 DIAGNOSIS — D631 Anemia in chronic kidney disease: Secondary | ICD-10-CM | POA: Diagnosis not present

## 2017-07-07 ENCOUNTER — Telehealth: Payer: Self-pay | Admitting: Cardiology

## 2017-07-07 NOTE — Telephone Encounter (Signed)
Pt is calling in to reschedule his appt with [facility] Pharmacist in BP clinic.  Pt was to see Georgina Peer on 07/02/17, and his [employer] bus was unable to provide transportation to this appt.  Pt states he is still dealing with fluctuating BP readings.  Pt states he attends dialysis on M, W, and Fridays.  Pt states when he was at dialysis yesterday, his BP was in the 284X systolic an 324 diastolic.  Pt states "dialysis did nothing about it, and just sent me on my way." Pt is frustrated and upset about his high readings. Pt is asymptomatic with these readings.  Pt states he is taking all his prescribed meds, monitoring his sodium intake, avoid prolong heat exposure, and reporting to dialysis as scheduled.  Pt would ideally like to see Georgina Peer or Jinny Blossom in BP clinic this Thursday if possible, due to constraints of dialysis on M,W, and Fridays.  Pt states if Georgina Peer was not able to see him this Thursday, would Dr Meda Coffee advise on BP Medication titration, until he could get in to be seen. Pt is asymptomatic today, and is currently at home resting comfortably.  Informed the pt that I will send both Georgina Peer and Dr Meda Coffee this message to review and advise on, and follow-up with the pt shortly thereafter. Pt verbalized understanding and agrees with this plan.

## 2017-07-07 NOTE — Telephone Encounter (Signed)
New message    Pt c/o BP issue: STAT if pt c/o blurred vision, one-sided weakness or slurred speech  1. What are your last 5 BP readings? 236/116 yesterday   2. Are you having any other symptoms (ex. Dizziness, headache, blurred vision, passed out)?  no  3. What is your BP issue? bp keeps fluctuating even though patient is staying out of the heat, and doing low sodium diet,  Patient can not get it under control since being released from the hospital.

## 2017-07-08 DIAGNOSIS — Z992 Dependence on renal dialysis: Secondary | ICD-10-CM | POA: Diagnosis not present

## 2017-07-08 DIAGNOSIS — N2581 Secondary hyperparathyroidism of renal origin: Secondary | ICD-10-CM | POA: Diagnosis not present

## 2017-07-08 DIAGNOSIS — N186 End stage renal disease: Secondary | ICD-10-CM | POA: Diagnosis not present

## 2017-07-08 DIAGNOSIS — D509 Iron deficiency anemia, unspecified: Secondary | ICD-10-CM | POA: Diagnosis not present

## 2017-07-08 DIAGNOSIS — J158 Pneumonia due to other specified bacteria: Secondary | ICD-10-CM | POA: Diagnosis not present

## 2017-07-08 DIAGNOSIS — D631 Anemia in chronic kidney disease: Secondary | ICD-10-CM | POA: Diagnosis not present

## 2017-07-08 NOTE — Telephone Encounter (Signed)
Jinny Blossom and I are actually both out of the office on Thursday. Raquel will be here but her schedule is booked solid. Our schedule is pretty tight over the next few weeks, but he could be added on the 11th at 11:30 or 4 or June 13th at 10 or 3:30 if needed. Otherwise, I would advise he increase his hydralazine to 89m TID and schedule in our next available (I think about 2 weeks from now).

## 2017-07-08 NOTE — Telephone Encounter (Signed)
Spoke with the pt and endorsed recommendations per [facility] Pharmacist in BP clinic. Informed the pt of open dates to be seen in our BP clinic.  Pt would like to be seen by Georgina Peer or Jinny Blossom in BP clinic for next Tuesday 07/14/17 at 1130.  Pt states he will call [employer] transportation now to have this arranged to make this appt.  Pt states he is currently at dialysis and his BP is improving while being there, now at 141/86.  Pt verbalized understanding and agrees with this plan.   Pt gracious for all the assistance provided.

## 2017-07-10 DIAGNOSIS — N186 End stage renal disease: Secondary | ICD-10-CM | POA: Diagnosis not present

## 2017-07-10 DIAGNOSIS — D509 Iron deficiency anemia, unspecified: Secondary | ICD-10-CM | POA: Diagnosis not present

## 2017-07-10 DIAGNOSIS — J158 Pneumonia due to other specified bacteria: Secondary | ICD-10-CM | POA: Diagnosis not present

## 2017-07-10 DIAGNOSIS — N2581 Secondary hyperparathyroidism of renal origin: Secondary | ICD-10-CM | POA: Diagnosis not present

## 2017-07-10 DIAGNOSIS — Z992 Dependence on renal dialysis: Secondary | ICD-10-CM | POA: Diagnosis not present

## 2017-07-10 DIAGNOSIS — D631 Anemia in chronic kidney disease: Secondary | ICD-10-CM | POA: Diagnosis not present

## 2017-07-13 DIAGNOSIS — N186 End stage renal disease: Secondary | ICD-10-CM | POA: Diagnosis not present

## 2017-07-13 DIAGNOSIS — J158 Pneumonia due to other specified bacteria: Secondary | ICD-10-CM | POA: Diagnosis not present

## 2017-07-13 DIAGNOSIS — D631 Anemia in chronic kidney disease: Secondary | ICD-10-CM | POA: Diagnosis not present

## 2017-07-13 DIAGNOSIS — N2581 Secondary hyperparathyroidism of renal origin: Secondary | ICD-10-CM | POA: Diagnosis not present

## 2017-07-13 DIAGNOSIS — D509 Iron deficiency anemia, unspecified: Secondary | ICD-10-CM | POA: Diagnosis not present

## 2017-07-13 DIAGNOSIS — Z992 Dependence on renal dialysis: Secondary | ICD-10-CM | POA: Diagnosis not present

## 2017-07-14 ENCOUNTER — Ambulatory Visit (INDEPENDENT_AMBULATORY_CARE_PROVIDER_SITE_OTHER): Payer: Medicare Other | Admitting: Pharmacist

## 2017-07-14 ENCOUNTER — Encounter: Payer: Self-pay | Admitting: Pharmacist

## 2017-07-14 VITALS — BP 158/96 | HR 60

## 2017-07-14 DIAGNOSIS — I10 Essential (primary) hypertension: Secondary | ICD-10-CM

## 2017-07-14 MED ORDER — AMLODIPINE BESYLATE 10 MG PO TABS: 10.0000 mg | ORAL_TABLET | Freq: Every day | ORAL | 2 refills | Status: DC

## 2017-07-14 NOTE — Patient Instructions (Signed)
RESTART amlodipine 20m daily.

## 2017-07-14 NOTE — Progress Notes (Signed)
Patient ID: James Wall                 DOB: October 13, 1965                      MRN: [id number]     HPI: James Wall is a 52 y.o. male patient of Dr. Meda Coffee who presents today for hypertension evaluation. PMH significant for prior medical history of hypertension that subsequently led to end-stage renal disease on renal hemodialysis for the last 6 years. The patient also has a diagnosis of liver cirrhosis of unknown etiology. He has had several changes to his BP medications recently. He reports that he would like for Dr. Meda Coffee and her team to manage his pressures. At his most recent OV, no medication changes were made as his pressure was borderline controlled. Since this time he missed his follow up due to difficulty with [employer] transportation. He has called and reported elevated measurements at dialysis that did respond slightly to dialysis.   He present today for additional BP management and follow up. He reports that his blood pressures at dialysis have been -  Last Friday 232/116, yest 213/116>>>144/83 after dialysis. He has not changed centers due to schedule. He has been on a waiting list to change centers on his schedule. He has been feeling much better after dialysis recently, but admits his pressures have been running higher. He would like tighter control on this. He reports that he stopped his amlodipine about a week or so after our last visit as he did not have refills on the medication. His pressures have been trending higher since then both before and after dialysis.   Current HTN meds:  Hydralazine 65m TID (12pm, 3pm, 9-10pm) - he endorses missing the middle dose about 2 times per week.  Amlodipine 132mdaily at night - he stopped this about 1 week after our last visit.  Clonidine 0.23m69meekly patch Metoprolol tartrate 100m91mD (12pm, 9-10pm)  Previously tried: none  BP goal: <130/80  Family History: Asthma in his brother; Cerebrovascular Accident in his father;  Congestive Heart Failure in his brother; Hypertension in his father and mother.  Social History: former smoker, occasional alcohol  Diet: He eats mostly from home. He avoids salts. He drinks Mt. Dew (2 cans per day), lemonade, tea (has cut back quite a bit and now only has when eating out) and water.   Exercise: No exercise currently due to feeling fatigued.   Home BP readings: does not have cuff, but monitored at dialysis. 218/109, 217/110, 138/90 yesterday, Monday - 218/  Wt Readings from Last 3 Encounters:  05/05/17 194 lb (88 kg)  04/23/17 198 lb 9.6 oz (90.1 kg)  03/17/17 195 lb 3.2 oz (88.5 kg)   BP Readings from Last 3 Encounters:  07/14/17 (!) 158/96  05/21/17 138/86  05/05/17 (!) 170/110   Pulse Readings from Last 3 Encounters:  07/14/17 60  05/21/17 (!) 58  05/05/17 69    Renal function: CrCl cannot be calculated (Patient's most recent lab result is older than the maximum 21 days allowed.).  Past Medical History:  Diagnosis Date  . Anemia   . Anxiety   . Arthritis    GOUT - pt not sure if this is true  . 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 Cirrhosis, nonalcoholic (HCC)[place]. COPD (chronic obstructive pulmonary disease) (HCC)[place]. Depression   . ESRD on hemodialysis (HCC)[place] Started  HD Jan 2009.  ESRD was due to HTN.  Dx'd with HTN in hospital 1996 according to pt, they had to keep him so he could get Medicaid to afford the BP medications.  First saw a nephrologist and started HD in the same year 2009.  Gets HD at [facility] on a MWF schedule.  Does not have DM. He had a left RC AVF that never functioned, a left upper arm AVF that worked for about 5 years and as of June  . GERD (gastroesophageal reflux disease)   . Hypertension   . Pneumonia 03/2017  . Renal insufficiency   . Secondary hyperparathyroidism ([place])   . Sepsis ([place]) 02/2013   from AVF , treated with Vancomycin.  Marland Kitchen Shortness of breath    With exertion  . Sleep apnea    no  longer using cpap     Current Outpatient Medications on File Prior to Visit  Medication Sig Dispense Refill  . acetaminophen (TYLENOL) 650 MG CR tablet Take 1 tablet (650 mg total) by mouth daily as needed for pain.    . cloNIDine (CATAPRES - DOSED IN MG/24 HR) 0.3 mg/24hr Place 0.3 mg onto the skin every Sunday. Saturday.    . hydrALAZINE (APRESOLINE) 25 MG tablet Take 1 tablet (25 mg total) by mouth 3 (three) times daily. 270 tablet 3  . hydrOXYzine (ATARAX/VISTARIL) 25 MG tablet Take 1 tablet (25 mg total) by mouth every 8 (eight) hours as needed for itching. Further refills need to come from your PCP. 30 tablet 3  . metoprolol tartrate (LOPRESSOR) 100 MG tablet Take 100 mg by mouth 2 (two) times daily.    Marland Kitchen omeprazole (PRILOSEC) 20 MG capsule Take 20 mg by mouth daily as needed (acid reflux).    . SENSIPAR 60 MG tablet Take 1 tablet (60 mg total) by mouth every Monday, Wednesday, and Friday with hemodialysis.    Marland Kitchen sevelamer carbonate (RENVELA) 800 MG tablet Take 3,200-4,800 mg by mouth See admin instructions. Take 4-6 tablets (3200-4800 mg) by mouth one to two times daily with meals    . tamsulosin (FLOMAX) 0.4 MG CAPS capsule Take 0.4 mg by mouth every other day.   0   No current facility-administered medications on file prior to visit.     Allergies  Allergen Reactions  . Aspirin Other (See Comments)    Made congestion worse    Blood pressure (!) 158/96, pulse 60.   Assessment/Plan: Hypertension: BP today is above goal. Will restart amlodipine 75m daily. Discussed increasing hydralazine, but will defer this for now. Follow up in HTN clinic in 4-6 weeks for additional medication titration.   Thank you, KLelan Pons APatterson Hammersmith [facility]  07/14/2017 4:14 PM

## 2017-07-15 DIAGNOSIS — N186 End stage renal disease: Secondary | ICD-10-CM | POA: Diagnosis not present

## 2017-07-15 DIAGNOSIS — J158 Pneumonia due to other specified bacteria: Secondary | ICD-10-CM | POA: Diagnosis not present

## 2017-07-15 DIAGNOSIS — D509 Iron deficiency anemia, unspecified: Secondary | ICD-10-CM | POA: Diagnosis not present

## 2017-07-15 DIAGNOSIS — Z992 Dependence on renal dialysis: Secondary | ICD-10-CM | POA: Diagnosis not present

## 2017-07-15 DIAGNOSIS — N2581 Secondary hyperparathyroidism of renal origin: Secondary | ICD-10-CM | POA: Diagnosis not present

## 2017-07-15 DIAGNOSIS — D631 Anemia in chronic kidney disease: Secondary | ICD-10-CM | POA: Diagnosis not present

## 2017-07-17 DIAGNOSIS — D509 Iron deficiency anemia, unspecified: Secondary | ICD-10-CM | POA: Diagnosis not present

## 2017-07-17 DIAGNOSIS — N2581 Secondary hyperparathyroidism of renal origin: Secondary | ICD-10-CM | POA: Diagnosis not present

## 2017-07-17 DIAGNOSIS — Z992 Dependence on renal dialysis: Secondary | ICD-10-CM | POA: Diagnosis not present

## 2017-07-17 DIAGNOSIS — N186 End stage renal disease: Secondary | ICD-10-CM | POA: Diagnosis not present

## 2017-07-17 DIAGNOSIS — J158 Pneumonia due to other specified bacteria: Secondary | ICD-10-CM | POA: Diagnosis not present

## 2017-07-17 DIAGNOSIS — D631 Anemia in chronic kidney disease: Secondary | ICD-10-CM | POA: Diagnosis not present

## 2017-07-20 DIAGNOSIS — N186 End stage renal disease: Secondary | ICD-10-CM | POA: Diagnosis not present

## 2017-07-20 DIAGNOSIS — J158 Pneumonia due to other specified bacteria: Secondary | ICD-10-CM | POA: Diagnosis not present

## 2017-07-20 DIAGNOSIS — D631 Anemia in chronic kidney disease: Secondary | ICD-10-CM | POA: Diagnosis not present

## 2017-07-20 DIAGNOSIS — Z992 Dependence on renal dialysis: Secondary | ICD-10-CM | POA: Diagnosis not present

## 2017-07-20 DIAGNOSIS — D509 Iron deficiency anemia, unspecified: Secondary | ICD-10-CM | POA: Diagnosis not present

## 2017-07-20 DIAGNOSIS — N2581 Secondary hyperparathyroidism of renal origin: Secondary | ICD-10-CM | POA: Diagnosis not present

## 2017-07-22 DIAGNOSIS — Z992 Dependence on renal dialysis: Secondary | ICD-10-CM | POA: Diagnosis not present

## 2017-07-22 DIAGNOSIS — D631 Anemia in chronic kidney disease: Secondary | ICD-10-CM | POA: Diagnosis not present

## 2017-07-22 DIAGNOSIS — D509 Iron deficiency anemia, unspecified: Secondary | ICD-10-CM | POA: Diagnosis not present

## 2017-07-22 DIAGNOSIS — J158 Pneumonia due to other specified bacteria: Secondary | ICD-10-CM | POA: Diagnosis not present

## 2017-07-22 DIAGNOSIS — N2581 Secondary hyperparathyroidism of renal origin: Secondary | ICD-10-CM | POA: Diagnosis not present

## 2017-07-22 DIAGNOSIS — N186 End stage renal disease: Secondary | ICD-10-CM | POA: Diagnosis not present

## 2017-07-24 DIAGNOSIS — D631 Anemia in chronic kidney disease: Secondary | ICD-10-CM | POA: Diagnosis not present

## 2017-07-24 DIAGNOSIS — D509 Iron deficiency anemia, unspecified: Secondary | ICD-10-CM | POA: Diagnosis not present

## 2017-07-24 DIAGNOSIS — N186 End stage renal disease: Secondary | ICD-10-CM | POA: Diagnosis not present

## 2017-07-24 DIAGNOSIS — J158 Pneumonia due to other specified bacteria: Secondary | ICD-10-CM | POA: Diagnosis not present

## 2017-07-24 DIAGNOSIS — N2581 Secondary hyperparathyroidism of renal origin: Secondary | ICD-10-CM | POA: Diagnosis not present

## 2017-07-24 DIAGNOSIS — Z992 Dependence on renal dialysis: Secondary | ICD-10-CM | POA: Diagnosis not present

## 2017-07-27 DIAGNOSIS — Z992 Dependence on renal dialysis: Secondary | ICD-10-CM | POA: Diagnosis not present

## 2017-07-27 DIAGNOSIS — D631 Anemia in chronic kidney disease: Secondary | ICD-10-CM | POA: Diagnosis not present

## 2017-07-27 DIAGNOSIS — D509 Iron deficiency anemia, unspecified: Secondary | ICD-10-CM | POA: Diagnosis not present

## 2017-07-27 DIAGNOSIS — J158 Pneumonia due to other specified bacteria: Secondary | ICD-10-CM | POA: Diagnosis not present

## 2017-07-27 DIAGNOSIS — N2581 Secondary hyperparathyroidism of renal origin: Secondary | ICD-10-CM | POA: Diagnosis not present

## 2017-07-27 DIAGNOSIS — N186 End stage renal disease: Secondary | ICD-10-CM | POA: Diagnosis not present

## 2017-07-29 DIAGNOSIS — D509 Iron deficiency anemia, unspecified: Secondary | ICD-10-CM | POA: Diagnosis not present

## 2017-07-29 DIAGNOSIS — J158 Pneumonia due to other specified bacteria: Secondary | ICD-10-CM | POA: Diagnosis not present

## 2017-07-29 DIAGNOSIS — Z992 Dependence on renal dialysis: Secondary | ICD-10-CM | POA: Diagnosis not present

## 2017-07-29 DIAGNOSIS — N2581 Secondary hyperparathyroidism of renal origin: Secondary | ICD-10-CM | POA: Diagnosis not present

## 2017-07-29 DIAGNOSIS — D631 Anemia in chronic kidney disease: Secondary | ICD-10-CM | POA: Diagnosis not present

## 2017-07-29 DIAGNOSIS — N186 End stage renal disease: Secondary | ICD-10-CM | POA: Diagnosis not present

## 2017-07-31 DIAGNOSIS — D509 Iron deficiency anemia, unspecified: Secondary | ICD-10-CM | POA: Diagnosis not present

## 2017-07-31 DIAGNOSIS — D631 Anemia in chronic kidney disease: Secondary | ICD-10-CM | POA: Diagnosis not present

## 2017-07-31 DIAGNOSIS — Z992 Dependence on renal dialysis: Secondary | ICD-10-CM | POA: Diagnosis not present

## 2017-07-31 DIAGNOSIS — N2581 Secondary hyperparathyroidism of renal origin: Secondary | ICD-10-CM | POA: Diagnosis not present

## 2017-07-31 DIAGNOSIS — N186 End stage renal disease: Secondary | ICD-10-CM | POA: Diagnosis not present

## 2017-07-31 DIAGNOSIS — J158 Pneumonia due to other specified bacteria: Secondary | ICD-10-CM | POA: Diagnosis not present

## 2017-08-03 DIAGNOSIS — I12 Hypertensive chronic kidney disease with 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: Secondary | ICD-10-CM | POA: Diagnosis not present

## 2017-08-03 DIAGNOSIS — Z992 Dependence on renal dialysis: Secondary | ICD-10-CM | POA: Diagnosis not present

## 2017-08-03 DIAGNOSIS — N186 End stage renal disease: Secondary | ICD-10-CM | POA: Diagnosis not present

## 2017-08-03 DIAGNOSIS — D509 Iron deficiency anemia, unspecified: Secondary | ICD-10-CM | POA: Diagnosis not present

## 2017-08-03 DIAGNOSIS — N2581 Secondary hyperparathyroidism of renal origin: Secondary | ICD-10-CM | POA: Diagnosis not present

## 2017-08-03 DIAGNOSIS — J158 Pneumonia due to other specified bacteria: Secondary | ICD-10-CM | POA: Diagnosis not present

## 2017-08-03 DIAGNOSIS — D631 Anemia in chronic kidney disease: Secondary | ICD-10-CM | POA: Diagnosis not present

## 2017-08-05 DIAGNOSIS — N186 End stage renal disease: Secondary | ICD-10-CM | POA: Diagnosis not present

## 2017-08-05 DIAGNOSIS — D631 Anemia in chronic kidney disease: Secondary | ICD-10-CM | POA: Diagnosis not present

## 2017-08-05 DIAGNOSIS — N2581 Secondary hyperparathyroidism of renal origin: Secondary | ICD-10-CM | POA: Diagnosis not present

## 2017-08-05 DIAGNOSIS — J158 Pneumonia due to other specified bacteria: Secondary | ICD-10-CM | POA: Diagnosis not present

## 2017-08-05 DIAGNOSIS — D509 Iron deficiency anemia, unspecified: Secondary | ICD-10-CM | POA: Diagnosis not present

## 2017-08-05 DIAGNOSIS — Z992 Dependence on renal dialysis: Secondary | ICD-10-CM | POA: Diagnosis not present

## 2017-08-07 DIAGNOSIS — N2581 Secondary hyperparathyroidism of renal origin: Secondary | ICD-10-CM | POA: Diagnosis not present

## 2017-08-07 DIAGNOSIS — N186 End stage renal disease: Secondary | ICD-10-CM | POA: Diagnosis not present

## 2017-08-07 DIAGNOSIS — D509 Iron deficiency anemia, unspecified: Secondary | ICD-10-CM | POA: Diagnosis not present

## 2017-08-07 DIAGNOSIS — Z992 Dependence on renal dialysis: Secondary | ICD-10-CM | POA: Diagnosis not present

## 2017-08-07 DIAGNOSIS — D631 Anemia in chronic kidney disease: Secondary | ICD-10-CM | POA: Diagnosis not present

## 2017-08-07 DIAGNOSIS — J158 Pneumonia due to other specified bacteria: Secondary | ICD-10-CM | POA: Diagnosis not present

## 2017-08-10 DIAGNOSIS — Z992 Dependence on renal dialysis: Secondary | ICD-10-CM | POA: Diagnosis not present

## 2017-08-10 DIAGNOSIS — D631 Anemia in chronic kidney disease: Secondary | ICD-10-CM | POA: Diagnosis not present

## 2017-08-10 DIAGNOSIS — N186 End stage renal disease: Secondary | ICD-10-CM | POA: Diagnosis not present

## 2017-08-10 DIAGNOSIS — N2581 Secondary hyperparathyroidism of renal origin: Secondary | ICD-10-CM | POA: Diagnosis not present

## 2017-08-10 DIAGNOSIS — D509 Iron deficiency anemia, unspecified: Secondary | ICD-10-CM | POA: Diagnosis not present

## 2017-08-10 DIAGNOSIS — J158 Pneumonia due to other specified bacteria: Secondary | ICD-10-CM | POA: Diagnosis not present

## 2017-08-12 DIAGNOSIS — N186 End stage renal disease: Secondary | ICD-10-CM | POA: Diagnosis not present

## 2017-08-12 DIAGNOSIS — J158 Pneumonia due to other specified bacteria: Secondary | ICD-10-CM | POA: Diagnosis not present

## 2017-08-12 DIAGNOSIS — D509 Iron deficiency anemia, unspecified: Secondary | ICD-10-CM | POA: Diagnosis not present

## 2017-08-12 DIAGNOSIS — Z992 Dependence on renal dialysis: Secondary | ICD-10-CM | POA: Diagnosis not present

## 2017-08-12 DIAGNOSIS — N2581 Secondary hyperparathyroidism of renal origin: Secondary | ICD-10-CM | POA: Diagnosis not present

## 2017-08-12 DIAGNOSIS — D631 Anemia in chronic kidney disease: Secondary | ICD-10-CM | POA: Diagnosis not present

## 2017-08-14 DIAGNOSIS — D631 Anemia in chronic kidney disease: Secondary | ICD-10-CM | POA: Diagnosis not present

## 2017-08-14 DIAGNOSIS — N186 End stage renal disease: Secondary | ICD-10-CM | POA: Diagnosis not present

## 2017-08-14 DIAGNOSIS — D509 Iron deficiency anemia, unspecified: Secondary | ICD-10-CM | POA: Diagnosis not present

## 2017-08-14 DIAGNOSIS — N2581 Secondary hyperparathyroidism of renal origin: Secondary | ICD-10-CM | POA: Diagnosis not present

## 2017-08-14 DIAGNOSIS — Z992 Dependence on renal dialysis: Secondary | ICD-10-CM | POA: Diagnosis not present

## 2017-08-14 DIAGNOSIS — J158 Pneumonia due to other specified bacteria: Secondary | ICD-10-CM | POA: Diagnosis not present

## 2017-08-17 DIAGNOSIS — Z992 Dependence on renal dialysis: Secondary | ICD-10-CM | POA: Diagnosis not present

## 2017-08-17 DIAGNOSIS — N2581 Secondary hyperparathyroidism of renal origin: Secondary | ICD-10-CM | POA: Diagnosis not present

## 2017-08-17 DIAGNOSIS — N186 End stage renal disease: Secondary | ICD-10-CM | POA: Diagnosis not present

## 2017-08-17 DIAGNOSIS — J158 Pneumonia due to other specified bacteria: Secondary | ICD-10-CM | POA: Diagnosis not present

## 2017-08-17 DIAGNOSIS — D509 Iron deficiency anemia, unspecified: Secondary | ICD-10-CM | POA: Diagnosis not present

## 2017-08-17 DIAGNOSIS — D631 Anemia in chronic kidney disease: Secondary | ICD-10-CM | POA: Diagnosis not present

## 2017-08-18 ENCOUNTER — Encounter: Payer: Self-pay | Admitting: Pharmacist

## 2017-08-18 ENCOUNTER — Ambulatory Visit (INDEPENDENT_AMBULATORY_CARE_PROVIDER_SITE_OTHER): Payer: Medicare Other | Admitting: Pharmacist

## 2017-08-18 DIAGNOSIS — I10 Essential (primary) hypertension: Secondary | ICD-10-CM

## 2017-08-18 MED ORDER — HYDRALAZINE HCL 50 MG PO TABS: 50.0000 mg | ORAL_TABLET | Freq: Three times a day (TID) | ORAL | 3 refills | Status: DC

## 2017-08-18 NOTE — Patient Instructions (Signed)
INCREASE hydralazine to 84m THREE times daily ( you may use 2 tablets of your current supply THREE times daily then pick up higher strength from pharmacy and take 1 tablet THREE times daily)  Follow up as scheduled in 4 weeks.

## 2017-08-18 NOTE — Progress Notes (Signed)
Patient ID: James Wall                 DOB: 04-04-1965                      MRN: [id number]     HPI: James Wall is a 52 y.o. male patient of Dr. Meda Coffee who presents today for hypertension follow up. PMH significant for prior medical history of hypertension that subsequently led to end-stage renal disease on renal hemodialysis for the last 6 years. The patient also has a diagnosis of liver cirrhosis of unknown etiology. He has had several changes to his BP medications recently. He reports that he would like for Dr. Meda Coffee and her team to manage his pressures. At his most recent OV, no medication changes were made as his pressure was borderline controlled. Since this time he missed his follow up due to difficulty with [employer] transportation. He has called and reported elevated measurements at dialysis that did respond slightly to dialysis. At his last OV his amlodipine was restarted. It was also discussed to increase hydralazine, but this was deferred.   He present today for follow up. He denies chest pain, SOB and dizziness. About 2 weeks ago had an episode of shaking that did not seem to be relived by warmth. He is unsure if was actually cold or something else going on. Advised to see PCP if this returns. He also reports difficulty with sleep and will see his primary doctor for this as well. He is pleased that his blood pressure has come down some since restarting amlodipine.   Current HTN meds:  Hydralazine 26m TID (12pm, 3pm, 9-10pm)   Amlodipine 122mdaily in the morning Clonidine 0.54m38meekly patch Metoprolol tartrate 100m36mD (12pm, 9-10pm)  Previously tried: none  BP goal: <130/80  Family History: Asthma in his brother; Cerebrovascular Accident in his father; Congestive Heart Failure in his brother; Hypertension in his father and mother.  Social History: former smoker, occasional alcohol  Diet: He eats mostly from home. He avoids salts. He drinks Mt. Dew (2 cans per day),  lemonade, tea (has cut back quite a bit and now only has when eating out) and water.   Exercise: No exercise currently due to feeling fatigued.   Home BP readings: does not have cuff, but monitored at dialysis. Before dialysis-150-160/90; after dialysis-130/87. Highest 167/101  Wt Readings from Last 3 Encounters:  05/05/17 194 lb (88 kg)  04/23/17 198 lb 9.6 oz (90.1 kg)  03/17/17 195 lb 3.2 oz (88.5 kg)   BP Readings from Last 3 Encounters:  08/18/17 (!) 152/98  07/14/17 (!) 158/96  05/21/17 138/86   Pulse Readings from Last 3 Encounters:  08/18/17 67  07/14/17 60  05/21/17 (!) 58    Renal function: CrCl cannot be calculated (Patient's most recent lab result is older than the maximum 21 days allowed.).  Past Medical History:  Diagnosis Date  . Anemia   . Anxiety   . Arthritis    GOUT - pt not sure if this is true  . 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 Cirrhosis, nonalcoholic (HCC)[place]. COPD (chronic obstructive pulmonary disease) (HCC)[place]. Depression   . [facility].  ESRD was due to HTN.  Dx'd with HTN in hospital 1996 according to pt, they had to keep him so he could get Medicaid to afford the BP medications.  First saw a nephrologist and started HD in the same year 2009.  Gets HD at [facility] on a MWF schedule.  Does not have DM. He had a left RC AVF that never functioned, a left upper arm AVF that worked for about 5 years and as of June  . GERD (gastroesophageal reflux disease)   . Hypertension   . Pneumonia 03/2017  . Renal insufficiency   . Secondary hyperparathyroidism ([place])   . Sepsis ([place]) 02/2013   from AVF , treated with Vancomycin.  Marland Kitchen Shortness of breath    With exertion  . Sleep apnea    no  longer using cpap    Current Outpatient Medications on File Prior to Visit  Medication Sig Dispense Refill  . acetaminophen (TYLENOL) 650 MG CR tablet Take 1 tablet (650 mg total) by mouth daily as needed for pain.    Marland Kitchen  amLODipine (NORVASC) 10 MG tablet Take 1 tablet (10 mg total) by mouth daily. 90 tablet 2  . cloNIDine (CATAPRES - DOSED IN MG/24 HR) 0.3 mg/24hr Place 0.3 mg onto the skin every Sunday. Saturday.    . hydrOXYzine (ATARAX/VISTARIL) 25 MG tablet Take 1 tablet (25 mg total) by mouth every 8 (eight) hours as needed for itching. Further refills need to come from your PCP. 30 tablet 3  . metoprolol tartrate (LOPRESSOR) 100 MG tablet Take 100 mg by mouth 2 (two) times daily.    Marland Kitchen omeprazole (PRILOSEC) 20 MG capsule Take 20 mg by mouth daily as needed (acid reflux).    . SENSIPAR 60 MG tablet Take 1 tablet (60 mg total) by mouth every Monday, Wednesday, and Friday with hemodialysis.    Marland Kitchen sevelamer carbonate (RENVELA) 800 MG tablet Take 3,200-4,800 mg by mouth See admin instructions. Take 4-6 tablets (3200-4800 mg) by mouth one to two times daily with meals    . tamsulosin (FLOMAX) 0.4 MG CAPS capsule Take 0.4 mg by mouth every other day.   0   No current facility-administered medications on file prior to visit.     Allergies  Allergen Reactions  . Aspirin Other (See Comments)    Made congestion worse    Blood pressure (!) 152/98, pulse 67, SpO2 98 %.   Assessment/Plan: Hypertension: BP is elevated today, but improved from previous with restart amlodoipine. Will increase hydralazine to 29m TID for tighter BP control. Follow up in HTN clinic in 4 weeks.   Thank you, KLelan Pons APatterson Hammersmith [facility]  08/18/2017 9:41 AM

## 2017-08-19 DIAGNOSIS — J158 Pneumonia due to other specified bacteria: Secondary | ICD-10-CM | POA: Diagnosis not present

## 2017-08-19 DIAGNOSIS — D631 Anemia in chronic kidney disease: Secondary | ICD-10-CM | POA: Diagnosis not present

## 2017-08-19 DIAGNOSIS — N2581 Secondary hyperparathyroidism of renal origin: Secondary | ICD-10-CM | POA: Diagnosis not present

## 2017-08-19 DIAGNOSIS — D509 Iron deficiency anemia, unspecified: Secondary | ICD-10-CM | POA: Diagnosis not present

## 2017-08-19 DIAGNOSIS — Z992 Dependence on renal dialysis: Secondary | ICD-10-CM | POA: Diagnosis not present

## 2017-08-19 DIAGNOSIS — N186 End stage renal disease: Secondary | ICD-10-CM | POA: Diagnosis not present

## 2017-08-21 DIAGNOSIS — D509 Iron deficiency anemia, unspecified: Secondary | ICD-10-CM | POA: Diagnosis not present

## 2017-08-21 DIAGNOSIS — N186 End stage renal disease: Secondary | ICD-10-CM | POA: Diagnosis not present

## 2017-08-21 DIAGNOSIS — J158 Pneumonia due to other specified bacteria: Secondary | ICD-10-CM | POA: Diagnosis not present

## 2017-08-21 DIAGNOSIS — Z992 Dependence on renal dialysis: Secondary | ICD-10-CM | POA: Diagnosis not present

## 2017-08-21 DIAGNOSIS — D631 Anemia in chronic kidney disease: Secondary | ICD-10-CM | POA: Diagnosis not present

## 2017-08-21 DIAGNOSIS — N2581 Secondary hyperparathyroidism of renal origin: Secondary | ICD-10-CM | POA: Diagnosis not present

## 2017-08-24 ENCOUNTER — Telehealth: Payer: Self-pay | Admitting: Family Medicine

## 2017-08-24 DIAGNOSIS — Z992 Dependence on renal dialysis: Secondary | ICD-10-CM | POA: Diagnosis not present

## 2017-08-24 DIAGNOSIS — N2581 Secondary hyperparathyroidism of renal origin: Secondary | ICD-10-CM | POA: Diagnosis not present

## 2017-08-24 DIAGNOSIS — D631 Anemia in chronic kidney disease: Secondary | ICD-10-CM | POA: Diagnosis not present

## 2017-08-24 DIAGNOSIS — J158 Pneumonia due to other specified bacteria: Secondary | ICD-10-CM | POA: Diagnosis not present

## 2017-08-24 DIAGNOSIS — N186 End stage renal disease: Secondary | ICD-10-CM | POA: Diagnosis not present

## 2017-08-24 DIAGNOSIS — D509 Iron deficiency anemia, unspecified: Secondary | ICD-10-CM | POA: Diagnosis not present

## 2017-08-24 NOTE — Telephone Encounter (Signed)
Pt has dialysis Monday,wed and Friday each week. He was extremely cold when he finished his treatment.  He has told the nurses at dialysis about being so cold.  He wants to know why this is happening.  Please advise

## 2017-08-25 NOTE — Telephone Encounter (Signed)
Pt informed of below. Zimmerman Rumple, Fender Herder D, CMA  

## 2017-08-26 DIAGNOSIS — N186 End stage renal disease: Secondary | ICD-10-CM | POA: Diagnosis not present

## 2017-08-26 DIAGNOSIS — D509 Iron deficiency anemia, unspecified: Secondary | ICD-10-CM | POA: Diagnosis not present

## 2017-08-26 DIAGNOSIS — J158 Pneumonia due to other specified bacteria: Secondary | ICD-10-CM | POA: Diagnosis not present

## 2017-08-26 DIAGNOSIS — D631 Anemia in chronic kidney disease: Secondary | ICD-10-CM | POA: Diagnosis not present

## 2017-08-26 DIAGNOSIS — Z992 Dependence on renal dialysis: Secondary | ICD-10-CM | POA: Diagnosis not present

## 2017-08-26 DIAGNOSIS — N2581 Secondary hyperparathyroidism of renal origin: Secondary | ICD-10-CM | POA: Diagnosis not present

## 2017-08-31 DIAGNOSIS — J158 Pneumonia due to other specified bacteria: Secondary | ICD-10-CM | POA: Diagnosis not present

## 2017-08-31 DIAGNOSIS — D509 Iron deficiency anemia, unspecified: Secondary | ICD-10-CM | POA: Diagnosis not present

## 2017-08-31 DIAGNOSIS — N2581 Secondary hyperparathyroidism of renal origin: Secondary | ICD-10-CM | POA: Diagnosis not present

## 2017-08-31 DIAGNOSIS — D631 Anemia in chronic kidney disease: Secondary | ICD-10-CM | POA: Diagnosis not present

## 2017-08-31 DIAGNOSIS — Z992 Dependence on renal dialysis: Secondary | ICD-10-CM | POA: Diagnosis not present

## 2017-08-31 DIAGNOSIS — N186 End stage renal disease: Secondary | ICD-10-CM | POA: Diagnosis not present

## 2017-09-02 DIAGNOSIS — D509 Iron deficiency anemia, unspecified: Secondary | ICD-10-CM | POA: Diagnosis not present

## 2017-09-02 DIAGNOSIS — N2581 Secondary hyperparathyroidism of renal origin: Secondary | ICD-10-CM | POA: Diagnosis not present

## 2017-09-02 DIAGNOSIS — Z992 Dependence on renal dialysis: Secondary | ICD-10-CM | POA: Diagnosis not present

## 2017-09-02 DIAGNOSIS — N186 End stage renal disease: Secondary | ICD-10-CM | POA: Diagnosis not present

## 2017-09-02 DIAGNOSIS — J158 Pneumonia due to other specified bacteria: Secondary | ICD-10-CM | POA: Diagnosis not present

## 2017-09-02 DIAGNOSIS — D631 Anemia in chronic kidney disease: Secondary | ICD-10-CM | POA: Diagnosis not present

## 2017-09-03 DIAGNOSIS — N186 End stage renal disease: Secondary | ICD-10-CM | POA: Diagnosis not present

## 2017-09-03 DIAGNOSIS — I12 Hypertensive chronic kidney disease with 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: Secondary | ICD-10-CM | POA: Diagnosis not present

## 2017-09-03 DIAGNOSIS — Z992 Dependence on renal dialysis: Secondary | ICD-10-CM | POA: Diagnosis not present

## 2017-09-04 DIAGNOSIS — N186 End stage renal disease: Secondary | ICD-10-CM | POA: Diagnosis not present

## 2017-09-04 DIAGNOSIS — T80211A Bloodstream infection due to central venous catheter, initial encounter: Secondary | ICD-10-CM | POA: Diagnosis not present

## 2017-09-04 DIAGNOSIS — Z992 Dependence on renal dialysis: Secondary | ICD-10-CM | POA: Diagnosis not present

## 2017-09-04 DIAGNOSIS — N2581 Secondary hyperparathyroidism of renal origin: Secondary | ICD-10-CM | POA: Diagnosis not present

## 2017-09-04 DIAGNOSIS — J158 Pneumonia due to other specified bacteria: Secondary | ICD-10-CM | POA: Diagnosis not present

## 2017-09-04 DIAGNOSIS — D631 Anemia in chronic kidney disease: Secondary | ICD-10-CM | POA: Diagnosis not present

## 2017-09-04 DIAGNOSIS — D509 Iron deficiency anemia, unspecified: Secondary | ICD-10-CM | POA: Diagnosis not present

## 2017-09-07 DIAGNOSIS — T80211A Bloodstream infection due to central venous catheter, initial encounter: Secondary | ICD-10-CM | POA: Diagnosis not present

## 2017-09-07 DIAGNOSIS — D509 Iron deficiency anemia, unspecified: Secondary | ICD-10-CM | POA: Diagnosis not present

## 2017-09-07 DIAGNOSIS — N186 End stage renal disease: Secondary | ICD-10-CM | POA: Diagnosis not present

## 2017-09-07 DIAGNOSIS — N2581 Secondary hyperparathyroidism of renal origin: Secondary | ICD-10-CM | POA: Diagnosis not present

## 2017-09-07 DIAGNOSIS — J158 Pneumonia due to other specified bacteria: Secondary | ICD-10-CM | POA: Diagnosis not present

## 2017-09-07 DIAGNOSIS — Z992 Dependence on renal dialysis: Secondary | ICD-10-CM | POA: Diagnosis not present

## 2017-09-09 DIAGNOSIS — N2581 Secondary hyperparathyroidism of renal origin: Secondary | ICD-10-CM | POA: Diagnosis not present

## 2017-09-09 DIAGNOSIS — J158 Pneumonia due to other specified bacteria: Secondary | ICD-10-CM | POA: Diagnosis not present

## 2017-09-09 DIAGNOSIS — Z992 Dependence on renal dialysis: Secondary | ICD-10-CM | POA: Diagnosis not present

## 2017-09-09 DIAGNOSIS — D509 Iron deficiency anemia, unspecified: Secondary | ICD-10-CM | POA: Diagnosis not present

## 2017-09-09 DIAGNOSIS — T80211A Bloodstream infection due to central venous catheter, initial encounter: Secondary | ICD-10-CM | POA: Diagnosis not present

## 2017-09-09 DIAGNOSIS — N186 End stage renal disease: Secondary | ICD-10-CM | POA: Diagnosis not present

## 2017-09-11 ENCOUNTER — Inpatient Hospital Stay (HOSPITAL_COMMUNITY)
Admission: EM | Admit: 2017-09-11 | Discharge: 2017-09-12 | DRG: 314 | Disposition: A | Discharge status: Home / Self Care | Payer: Medicare Other | Attending: Internal Medicine | Admitting: Internal Medicine

## 2017-09-11 ENCOUNTER — Emergency Department (HOSPITAL_COMMUNITY): Payer: Medicare Other

## 2017-09-11 ENCOUNTER — Other Ambulatory Visit: Payer: Self-pay

## 2017-09-11 ENCOUNTER — Encounter (HOSPITAL_COMMUNITY): Payer: Self-pay | Admitting: *Deleted

## 2017-09-11 DIAGNOSIS — Z992 Dependence on renal dialysis: Secondary | ICD-10-CM | POA: Diagnosis not present

## 2017-09-11 DIAGNOSIS — T827XXA Infection and inflammatory reaction due to other cardiac and vascular devices, implants and grafts, initial encounter: Principal | ICD-10-CM | POA: Diagnosis present

## 2017-09-11 DIAGNOSIS — J449 Chronic obstructive pulmonary disease, unspecified: Secondary | ICD-10-CM | POA: Diagnosis present

## 2017-09-11 DIAGNOSIS — Z886 Allergy status to analgesic agent status: Secondary | ICD-10-CM | POA: Diagnosis not present

## 2017-09-11 DIAGNOSIS — I12 Hypertensive chronic kidney disease with 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: Secondary | ICD-10-CM | POA: Diagnosis not present

## 2017-09-11 DIAGNOSIS — Z8249 Family history of ischemic heart disease and other diseases of the circulatory system: Secondary | ICD-10-CM | POA: Diagnosis not present

## 2017-09-11 DIAGNOSIS — N2581 Secondary hyperparathyroidism of renal origin: Secondary | ICD-10-CM | POA: Diagnosis not present

## 2017-09-11 DIAGNOSIS — D631 Anemia in chronic kidney disease: Secondary | ICD-10-CM | POA: Diagnosis present

## 2017-09-11 DIAGNOSIS — N186 End stage renal disease: Secondary | ICD-10-CM | POA: Diagnosis not present

## 2017-09-11 DIAGNOSIS — R531 Weakness: Secondary | ICD-10-CM | POA: Diagnosis not present

## 2017-09-11 DIAGNOSIS — M1990 Unspecified osteoarthritis, unspecified site: Secondary | ICD-10-CM | POA: Diagnosis present

## 2017-09-11 DIAGNOSIS — K7460 Unspecified cirrhosis of liver: Secondary | ICD-10-CM | POA: Diagnosis present

## 2017-09-11 DIAGNOSIS — M109 Gout, unspecified: Secondary | ICD-10-CM | POA: Diagnosis present

## 2017-09-11 DIAGNOSIS — J9811 Atelectasis: Secondary | ICD-10-CM | POA: Diagnosis not present

## 2017-09-11 DIAGNOSIS — K219 Gastro-esophageal reflux disease without esophagitis: Secondary | ICD-10-CM | POA: Diagnosis present

## 2017-09-11 DIAGNOSIS — G4730 Sleep apnea, unspecified: Secondary | ICD-10-CM | POA: Diagnosis present

## 2017-09-11 DIAGNOSIS — Z823 Family history of stroke: Secondary | ICD-10-CM

## 2017-09-11 DIAGNOSIS — R6883 Chills (without fever): Secondary | ICD-10-CM | POA: Diagnosis not present

## 2017-09-11 DIAGNOSIS — Z87891 Personal history of nicotine dependence: Secondary | ICD-10-CM

## 2017-09-11 DIAGNOSIS — Z825 Family history of asthma and other chronic lower respiratory diseases: Secondary | ICD-10-CM

## 2017-09-11 DIAGNOSIS — R7881 Bacteremia: Secondary | ICD-10-CM | POA: Diagnosis not present

## 2017-09-11 DIAGNOSIS — I160 Hypertensive urgency: Secondary | ICD-10-CM | POA: Diagnosis not present

## 2017-09-11 DIAGNOSIS — N400 Enlarged prostate without lower urinary tract symptoms: Secondary | ICD-10-CM

## 2017-09-11 DIAGNOSIS — R509 Fever, unspecified: Secondary | ICD-10-CM | POA: Diagnosis not present

## 2017-09-11 LAB — COMPREHENSIVE METABOLIC PANEL
ALBUMIN: 3.3 g/dL — AB (ref 3.5–5.0)
ALT: 8 U/L (ref 0–44)
AST: 14 U/L — ABNORMAL LOW (ref 15–41)
Alkaline Phosphatase: 40 U/L (ref 38–126)
Anion gap: 17 — ABNORMAL HIGH (ref 5–15)
BUN: 45 mg/dL — AB (ref 6–20)
CHLORIDE: 98 mmol/L (ref 98–111)
CO2: 25 mmol/L (ref 22–32)
CREATININE: 17.51 mg/dL — AB (ref 0.61–1.24)
Calcium: 10 mg/dL (ref 8.9–10.3)
GFR calc Af Amer: 3 mL/min — ABNORMAL LOW (ref >60)
GFR calc non Af Amer: 3 mL/min — ABNORMAL LOW (ref >60)
GLUCOSE: 90 mg/dL (ref 70–99)
Potassium: 4.7 mmol/L (ref 3.5–5.1)
SODIUM: 140 mmol/L (ref 135–145)
Total Bilirubin: 1.1 mg/dL (ref 0.3–1.2)
Total Protein: 7.2 g/dL (ref 6.5–8.1)

## 2017-09-11 LAB — CBC WITH DIFFERENTIAL/PLATELET
Abs Immature Granulocytes: 0 10*3/uL (ref 0.0–0.1)
Basophils Absolute: 0 10*3/uL (ref 0.0–0.1)
Basophils Relative: 1 %
EOS ABS: 0.5 10*3/uL (ref 0.0–0.7)
EOS PCT: 7 %
HEMATOCRIT: 30.3 % — AB (ref 39.0–52.0)
HEMOGLOBIN: 9.4 g/dL — AB (ref 13.0–17.0)
Immature Granulocytes: 0 %
LYMPHS ABS: 1.4 10*3/uL (ref 0.7–4.0)
Lymphocytes Relative: 18 %
MCH: 27.7 pg (ref 26.0–34.0)
MCHC: 31 g/dL (ref 30.0–36.0)
MCV: 89.4 fL (ref 78.0–100.0)
Monocytes Absolute: 0.7 10*3/uL (ref 0.1–1.0)
Monocytes Relative: 9 %
Neutro Abs: 5 10*3/uL (ref 1.7–7.7)
Neutrophils Relative %: 65 %
Platelets: 209 10*3/uL (ref 150–400)
RBC: 3.39 MIL/uL — ABNORMAL LOW (ref 4.22–5.81)
RDW: 16.7 % — AB (ref 11.5–15.5)
WBC: 7.6 10*3/uL (ref 4.0–10.5)

## 2017-09-11 LAB — I-STAT CG4 LACTIC ACID, ED: LACTIC ACID, VENOUS: 0.94 mmol/L (ref 0.5–1.9)

## 2017-09-11 LAB — MRSA PCR SCREENING: MRSA BY PCR: NEGATIVE

## 2017-09-11 MED ORDER — ALBUTEROL SULFATE (2.5 MG/3ML) 0.083% IN NEBU: 2.5000 mg | INHALATION_SOLUTION | Freq: Four times a day (QID) | RESPIRATORY_TRACT | Status: DC | PRN

## 2017-09-11 MED ORDER — SODIUM CHLORIDE 0.9 % IV SOLN
2.0000 g | Freq: Once | INTRAVENOUS | Status: AC
  Administered 2017-09-11: 2 g via INTRAVENOUS
  Filled 2017-09-11: qty 2

## 2017-09-11 MED ORDER — ONDANSETRON HCL 4 MG PO TABS: 4.0000 mg | ORAL_TABLET | Freq: Four times a day (QID) | ORAL | Status: DC | PRN

## 2017-09-11 MED ORDER — CHLORHEXIDINE GLUCONATE CLOTH 2 % EX PADS: 6.0000 | MEDICATED_PAD | Freq: Every day | CUTANEOUS | Status: DC

## 2017-09-11 MED ORDER — PANTOPRAZOLE SODIUM 40 MG PO TBEC
40.0000 mg | DELAYED_RELEASE_TABLET | Freq: Every day | ORAL | Status: DC | PRN
Start: 2017-09-11 — End: 2017-09-12
  Administered 2017-09-12: 40 mg via ORAL
  Filled 2017-09-11: qty 1

## 2017-09-11 MED ORDER — ACETAMINOPHEN 325 MG PO TABS
650.0000 mg | ORAL_TABLET | Freq: Four times a day (QID) | ORAL | Status: DC | PRN
  Administered 2017-09-11: 650 mg via ORAL

## 2017-09-11 MED ORDER — SEVELAMER CARBONATE 800 MG PO TABS
3200.0000 mg | ORAL_TABLET | Freq: Two times a day (BID) | ORAL | Status: DC
  Administered 2017-09-12 (×2): 3200 mg via ORAL
  Filled 2017-09-11: qty 4
  Filled 2017-09-11: qty 6
  Filled 2017-09-11 (×2): qty 4

## 2017-09-11 MED ORDER — ONDANSETRON HCL 4 MG/2ML IJ SOLN: 4.0000 mg | Freq: Four times a day (QID) | INTRAMUSCULAR | Status: DC | PRN

## 2017-09-11 MED ORDER — METOPROLOL TARTRATE 50 MG PO TABS
100.0000 mg | ORAL_TABLET | Freq: Two times a day (BID) | ORAL | Status: DC
  Administered 2017-09-11 – 2017-09-12 (×2): 100 mg via ORAL
  Filled 2017-09-11 (×2): qty 2

## 2017-09-11 MED ORDER — CINACALCET HCL 30 MG PO TABS: 60.0000 mg | ORAL_TABLET | ORAL | Status: DC

## 2017-09-11 MED ORDER — CLONIDINE HCL 0.3 MG/24HR TD PTWK: 0.3000 mg | MEDICATED_PATCH | TRANSDERMAL | Status: DC

## 2017-09-11 MED ORDER — DEXTROSE 5 % IV SOLN
500.0000 mg | INTRAVENOUS | Status: AC
  Administered 2017-09-11: 500 mg via INTRAVENOUS
  Filled 2017-09-11: qty 0.5

## 2017-09-11 MED ORDER — HEPARIN SODIUM (PORCINE) 5000 UNIT/ML IJ SOLN
5000.0000 [IU] | Freq: Three times a day (TID) | INTRAMUSCULAR | Status: DC
  Filled 2017-09-11: qty 1

## 2017-09-11 MED ORDER — HYDRALAZINE HCL 50 MG PO TABS
100.0000 mg | ORAL_TABLET | Freq: Three times a day (TID) | ORAL | Status: DC
  Administered 2017-09-11 – 2017-09-12 (×2): 100 mg via ORAL
  Filled 2017-09-11 (×2): qty 2

## 2017-09-11 MED ORDER — SODIUM CHLORIDE 0.9% FLUSH
3.0000 mL | Freq: Two times a day (BID) | INTRAVENOUS | Status: DC
  Administered 2017-09-11 – 2017-09-12 (×2): 3 mL via INTRAVENOUS

## 2017-09-11 MED ORDER — AMLODIPINE BESYLATE 10 MG PO TABS
10.0000 mg | ORAL_TABLET | Freq: Every day | ORAL | Status: DC
  Administered 2017-09-12: 10 mg via ORAL
  Filled 2017-09-11: qty 1

## 2017-09-11 MED ORDER — TAMSULOSIN HCL 0.4 MG PO CAPS
0.4000 mg | ORAL_CAPSULE | ORAL | Status: DC
  Administered 2017-09-12: 0.4 mg via ORAL
  Filled 2017-09-11: qty 1

## 2017-09-11 MED ORDER — SODIUM CHLORIDE 0.9 % IV SOLN
1.0000 g | INTRAVENOUS | Status: DC
  Filled 2017-09-11: qty 1

## 2017-09-11 MED ORDER — ACETAMINOPHEN 325 MG PO TABS
ORAL_TABLET | ORAL | Status: AC
  Filled 2017-09-11: qty 2

## 2017-09-11 MED ORDER — HYDRALAZINE HCL 20 MG/ML IJ SOLN
10.0000 mg | INTRAMUSCULAR | Status: DC | PRN
Start: 2017-09-11 — End: 2017-09-12

## 2017-09-11 MED ORDER — ACETAMINOPHEN 650 MG RE SUPP: 650.0000 mg | Freq: Four times a day (QID) | RECTAL | Status: DC | PRN

## 2017-09-11 NOTE — Consult Note (Signed)
Renal Service Consult Note [facility]  [facility] 09/11/2017 [name] Requesting Physician:  Dr Alvino Chapel  Reason for Consult:  Chills on HD, +blood cx from HD unit HPI: The patient is a 52 y.o. year-old w/ history of HTN, PNA, depression, cirrhosis and COPD (both mild/ asymptomatic), multiple HD access failures presented to ED for history of rigors and new+blood cx's (+GNR"s, ID/ sensitivities pend) sent yesterday from the OP HD unit.  In ED temp is 98.3 w/ BP's 183/103.  Asked to see in consultation.   Patient describes about 3 weeks of problems w/ shakes/ rigors occurring on dialysis and for a couple of hours after dialysis.  Says he asked the nurse to take blood cx's and they did it yesterday and today the blood cx's are positive, he was told to come to HD or to the ED.  He is having symptoms/ rigors essentially only during and after dialysis sessions.  Looking in [place] he has shortened most of his HD sessions for the last 3-4 weeks.    Patient states he has had 2 failed accesses in R arm and 3 failed in the L arm. "I'm an not having any more accesses put in" his arms or legs.  On HD x 10 yrs.  No back pain , chest pain or abd pain/ n/v/d at present, no other c/o's.    Pt was admitted here in Feb 2019 for significant length of time for [place] PNA/ aspiration PNA.  Received 7d IV abx , HIV and legionella were negative. Had 2 episodes of BRBPR while in hospital, no procedures done.     ROS  denies CP  no joint pain   no HA  no blurry vision  no rash  no diarrhea  no nausea/ vomiting    Past Medical History  Past Medical History:  Diagnosis Date  . Anemia   . Anxiety   . Arthritis    GOUT - pt not sure if this is true  . AV fistula occlusion ([place]) 10/2015  . Cirrhosis, nonalcoholic ([place])   . COPD (chronic obstructive pulmonary disease) ([place])   . Depression   . ESRD on hemodialysis [facility])    Started HD Jan 2009.  ESRD was due to HTN.  Dx'd with  HTN in hospital 1996 according to pt, they had to keep him so he could get Medicaid to afford the BP medications.  First saw a nephrologist and started HD in the same year 2009.  Gets HD at [facility] on a MWF schedule.  Does not have DM. He had a left RC AVF that never functioned, a left upper arm AVF that worked for about 5 years and as of June  . GERD (gastroesophageal reflux disease)   . Hypertension   . Pneumonia 03/2017  . Renal insufficiency   . Secondary hyperparathyroidism ([place])   . Sepsis ([place]) 02/2013   from AVF , treated with Vancomycin.  Marland Kitchen Shortness of breath    With exertion  . Sleep apnea    no  longer using cpap   Past Surgical History  Past Surgical History:  Procedure Laterality Date  . AV FISTULA PLACEMENT  2009   Left lower arm AVF  . AV FISTULA PLACEMENT Right 02/22/2013   Procedure:  CREATION  OF BRACHIAL CEPHALIC FISTULA RIGHT ARM;  Surgeon: Elam Dutch, MD;  Location: [place];  Service: Vascular;  Laterality: Right;  . AV FISTULA PLACEMENT Left 08/10/2014   Procedure: BASILIC VEIN  TRANSPOSITION  ARTERIOVENOUS (AV) FISTULA CREATION LEFT UPPER ARM;  Surgeon: Mal Misty, MD;  Location: [place];  Service: Vascular;  Laterality: Left;  . COLONOSCOPY    . ESOPHAGOGASTRODUODENOSCOPY (EGD) WITH PROPOFOL N/A 04/12/2013   Procedure: ESOPHAGOGASTRODUODENOSCOPY (EGD) WITH PROPOFOL;  Surgeon: Arta Silence, MD;  Location: [facility];  Service: Endoscopy;  Laterality: N/A;  . INSERTION OF DIALYSIS CATHETER N/A 12/23/2012   Procedure: INSERTION OF DIALYSIS CATHETER; ULTRASOUND GUIDED;  Surgeon: Angelia Mould, MD;  Location: [facility] OR;  Service: Vascular;  Laterality: N/A;  . INSERTION OF DIALYSIS CATHETER  10/22/2015   Right IJ non-tunneled HD catheter  . IR GENERIC HISTORICAL  10/22/2015   IR US GUIDE VASC ACCESS RIGHT 10/22/2015 [facility]  . IR GENERIC HISTORICAL  10/22/2015   IR FLUORO GUIDE CV LINE RIGHT 10/22/2015 [facility]  . IR GENERIC HISTORICAL  10/23/2015    IR FLUORO GUIDE CV LINE RIGHT 10/23/2015 Marybelle Killings, MD [facility]  . LEFT HEART CATHETERIZATION WITH CORONARY ANGIOGRAM N/A 07/13/2013   Procedure: LEFT HEART CATHETERIZATION WITH CORONARY ANGIOGRAM;  Surgeon: Jettie Booze, MD;  Location[facility] C[facility] LAB;  Service: Cardiovascular;  Laterality: N/A;  . LIGATION OF ARTERIOVENOUS  FISTULA Left 12/22/2012   Procedure: LIGATION OF ARTERIOVENOUS  FISTULA;EXCISION OF LARGE ANEURYSMS;;  Surgeon: Elam Dutch, MD;  Location: [facility] OR;  Service: Vascular;  Laterality: Left;   Family History  Family History  Problem Relation Age of Onset  . Hypertension Mother   . Cerebrovascular Accident Father   . Hypertension Father   . Congestive Heart Failure Brother   . Asthma Brother    Social History  reports that he quit smoking about 6 months ago. His smoking use included cigarettes. He has a 8.00 pack-year smoking history. He has never used smokeless tobacco. He reports that he drinks alcohol. He reports that he does not use drugs. Allergies  Allergies  Allergen Reactions  . Aspirin Other (See Comments)    Made congestion worse   Home medications Prior to Admission medications   Medication Sig Start Date End Date Taking? Authorizing Provider  acetaminophen (TYLENOL) 650 MG CR tablet Take 1 tablet (650 mg total) by mouth daily as needed for pain. 03/17/17  Yes Mariel Aloe, MD  amLODipine (NORVASC) 10 MG tablet Take 1 tablet (10 mg total) by mouth daily. 07/14/17  Yes Dorothy Spark, MD  cloNIDine (CATAPRES - DOSED IN MG/24 HR) 0.3 mg/24hr Place 0.3 mg onto the skin every Sunday. Saturday.   Yes [provider]  hydrALAZINE (APRESOLINE) 50 MG tablet Take 1 tablet (50 mg total) by mouth 3 (three) times daily. Patient taking differently: Take 100 mg by mouth 3 (three) times daily.  08/18/17  Yes Dorothy Spark, MD  hydrOXYzine (ATARAX/VISTARIL) 25 MG tablet Take 1 tablet (25 mg total) by mouth every 8 (eight) hours as needed for  itching. Further refills need to come from your PCP. 04/23/17  Yes Dorothy Spark, MD  metoprolol tartrate (LOPRESSOR) 100 MG tablet Take 100 mg by mouth 2 (two) times daily. 04/22/17  Yes [provider]  omeprazole (PRILOSEC) 20 MG capsule Take 20 mg by mouth daily as needed (acid reflux).   Yes [provider]  SENSIPAR 60 MG tablet Take 1 tablet (60 mg total) by mouth every Monday, Wednesday, and Friday with hemodialysis. Patient taking differently: Take 60 mg by mouth every Monday, Wednesday, and Friday with hemodialysis. Liquid they put in his IV at dialysys 03/17/17  Yes Mariel Aloe, MD  sevelamer carbonate (RENVELA) 800 MG tablet Take 3,200-4,800 mg by mouth See admin instructions. Take 4-6 tablets (3200-4800 mg) by mouth one to two times daily with meals   Yes [provider]  tamsulosin (FLOMAX) 0.4 MG CAPS capsule Take 0.4 mg by mouth every other day.  08/01/14   [provider]   Liver Function Tests Recent Labs  Lab 09/11/17 1027  AST 14*  ALT 8  ALKPHOS 40  BILITOT 1.1  PROT 7.2  ALBUMIN 3.3*   No results for input(s): LIPASE, AMYLASE in the last 168 hours. CBC Recent Labs  Lab 09/11/17 1027  WBC 7.6  NEUTROABS 5.0  HGB 9.4*  HCT 30.3*  MCV 89.4  PLT 834   Basic Metabolic Panel Recent Labs  Lab 09/11/17 1027  NA 140  K 4.7  CL 98  CO2 25  GLUCOSE 90  BUN 45*  CREATININE 17.51*  CALCIUM 10.0   Iron/TIBC/Ferritin/ %Sat    Component Value Date/Time   IRON 58 07/10/2013 1500   TIBC 164 (L) 07/10/2013 1500   FERRITIN 1,060 (H) 07/10/2013 1500   IRONPCTSAT 35 07/10/2013 1500    Vitals:   09/11/17 1145 09/11/17 1215 09/11/17 1230 09/11/17 1245  BP: (!) 174/99 (!) 185/99 (!) 181/99 (!) 182/110  Pulse: 63 61 (!) 57 63  Resp: (!) 21 13 10 16   Temp:      TempSrc:      SpO2: 100% 94% 96% 97%   Exam Gen alert, not in distress, no c/o No rash, cyanosis or gangrene Sclera anicteric, throat clear  No jvd or  bruits R chest TDC no pus or tunnel abscess Chest clear bilat to bases RRR no MRG Abd soft ntnd no mass or ascites +bs GU normal male MS no joint effusions or deformity Ext no LE edema, no wounds or ulcers Neuro is alert, Ox 3 , nf    Home meds:  - norvasc 10 qd/ hydralazine 50 tid/ metoprolol 100 bid/ catapres patch 0.3 weekly  - PPI/ sensipar 60 tiw/ renvela ac/ flomax  Dialysis: [facility] MWF   3.5 hr    87.5kg  2/2 bath  Heparin 4000 then 1000 midrun prn - parsabiv 2.5 mg iv tiw - mircera 150 q 2wks last 7/31 - calcitriol 2.25 ug tiw po   Impression: 1  Catheter-related bacteremia - growing GNR's form 8/8 blood cx's done at OP HD yest.  Needs one more HD from this catheter (^^B/Cr) so will give 2gm Fortaz then dialyze full 3.5 hr session if he can tolerate.  Will consult [facility] for catheter removal tomorrow am, and then replacement access as usual 48-72 hrs after.   Patient is catheter-dependent due to refusing to have any more permanent access put in, has been on HD about 10 yrs.  2  ESRD MWF HD. Missed a lot of HD due to s/o early the last few weeks.  3  HTN - on 4 bp meds at home. BP's high here, will UF 2L as tol w/ HD today. Cont meds as tolerated.  4  MBD ckd - cont meds 5  Anemia ckd - due for esa next week on 8/14, Hb 9's 6  Volume - no vol excess on exam, BP's up though   Plan - as above   Kelly Splinter MD [place] pager [phone]   09/11/2017, 1:17 PM

## 2017-09-11 NOTE — ED Notes (Signed)
Pt aware that a urine sample is needed.

## 2017-09-11 NOTE — ED Provider Notes (Signed)
[facility] EMERGENCY DEPARTMENT Provider Note   CSN: [id number] Arrival date & time: 09/11/17  1020     History   Chief Complaint Chief Complaint  Patient presents with  . Blood Infection    HPI James Wall is a 52 y.o. male.  HPI   James Wall is a 52 year old male with a history of ESRD (HD M/W/Fri), hypertension, anemia of chronic disease, COPD, nonalcoholic cirrhosis, anxiety, sleep apnea who presents to the emergency department via instructions from his dialysis center for positive blood cultures.  Patient reports that over the past 5 months he has had chills while getting dialysis.  Reports that when he gets home he feels sick, weak, tired and has no appetite.  States that he asked his dialysis center if he could get blood cultures drawn 2 days ago.  The cultures were drawn and he was notified today that they came back positive.  He does not have the culture report with him.  States his nephrologist is Dr. Carmina Miller with [facility].  He last received dialysis 2 days ago.  He denies any symptoms currently.  He does say that he has a right lower molar which is broken and needs fixing and he is looking for a dentist.  Denies pain over the tooth.  Denies fevers, chills, cough, congestion, sore throat, rashes, dysuria (he does urinate), abdominal pain, chest pain, shortness of breath.  He did not take his blood pressure medication today.  Past Medical History:  Diagnosis Date  . Anemia   . Anxiety   . Arthritis    GOUT - pt not sure if this is true  . AV fistula occlusion ([place]) 10/2015  . Cirrhosis, nonalcoholic ([place])   . COPD (chronic obstructive pulmonary disease) ([place])   . Depression   . ESRD on hemodialysis [facility])    Started HD Jan 2009.  ESRD was due to HTN.  Dx'd with HTN in hospital 1996 according to pt, they had to keep him so he could get Medicaid to afford the BP medications.  First saw a nephrologist and started HD in the same year  2009.  Gets HD at [facility] on a MWF schedule.  Does not have DM. He had a left RC AVF that never functioned, a left upper arm AVF that worked for about 5 years and as of June  . GERD (gastroesophageal reflux disease)   . Hypertension   . Pneumonia 03/2017  . Renal insufficiency   . Secondary hyperparathyroidism ([place])   . Sepsis ([place]) 02/2013   from AVF , treated with Vancomycin.  Marland Kitchen Shortness of breath    With exertion  . Sleep apnea    no  longer using cpap    Patient Active Problem List   Diagnosis Date Noted  . Blood per rectum 05/05/2017  . Establishing care with new doctor, encounter for 05/05/2017  . Dry skin 05/05/2017  . Cirrhosis ([place])   . LLQ pain   . Tooth abscess   . Hyperkalemia 03/09/2017  . Complication of AV dialysis fistula 10/22/2015  . ESRD on dialysis ([place]) 10/22/2015  . Elevated d-dimer 07/12/2013  . Abnormal cardiovascular function study 07/12/2013  . Chest pain at rest 07/04/2013  . Abnormal stress test 06/22/2013  . GERD (gastroesophageal reflux disease) 12/21/2012  . Anemia of renal disease 12/21/2012  . Essential hypertension 04/06/2008  . ASTHMA 04/06/2008  . SLEEP APNEA 04/06/2008    Past Surgical History:  Procedure Laterality Date  .  AV FISTULA PLACEMENT  2009   Left lower arm AVF  . AV FISTULA PLACEMENT Right 02/22/2013   Procedure:  CREATION  OF BRACHIAL CEPHALIC FISTULA RIGHT ARM;  Surgeon: Elam Dutch, MD;  Location: [facility];  Service: Vascular;  Laterality: Right;  . AV FISTULA PLACEMENT Left 08/10/2014   Procedure: BASILIC VEIN TRANSPOSITION  ARTERIOVENOUS (AV) FISTULA CREATION LEFT UPPER ARM;  Surgeon: Mal Misty, MD;  Location: [facility];  Service: Vascular;  Laterality: Left;  . COLONOSCOPY    . ESOPHAGOGASTRODUODENOSCOPY (EGD) WITH PROPOFOL N/A 04/12/2013   Procedure: ESOPHAGOGASTRODUODENOSCOPY (EGD) WITH PROPOFOL;  Surgeon: Arta Silence, MD;  Location: [facility];  Service: Endoscopy;  Laterality: N/A;  . INSERTION OF DIALYSIS  CATHETER N/A 12/23/2012   Procedure: INSERTION OF DIALYSIS CATHETER; ULTRASOUND GUIDED;  Surgeon: Angelia Mould, MD;  Location: [facility] OR;  Service: Vascular;  Laterality: N/A;  . INSERTION OF DIALYSIS CATHETER  10/22/2015   Right IJ non-tunneled HD catheter  . IR GENERIC HISTORICAL  10/22/2015   IR US GUIDE VASC ACCESS RIGHT 10/22/2015 [facility]INTER[facility]  . IR GENERIC HISTORICAL  10/22/2015   IR FLUORO GUIDE CV LINE RIGHT 10/22/2015 [facility]  . IR GENERIC HISTORICAL  10/23/2015   IR FLUORO GUIDE CV LINE RIGHT 10/23/2015 Marybelle Killings, MD [facility]INTER[facility]  . LEFT HEART CATHETERIZATION WITH CORONARY ANGIOGRAM N/A 07/13/2013   Procedure: LEFT HEART CATHETERIZATION WITH CORONARY ANGIOGRAM;  Surgeon: Jettie Booze, MD;  Location: [facility] CATH LAB;  Service: Cardiovascular;  Laterality: N/A;  . LIGATION OF ARTERIOVENOUS  FISTULA Left 12/22/2012   Procedure: LIGATION OF ARTERIOVENOUS  FISTULA;EXCISION OF LARGE ANEURYSMS;;  Surgeon: Elam Dutch, MD;  Location: [facility];  Service: Vascular;  Laterality: Left;        Home Medications    Prior to Admission medications   Medication Sig Start Date End Date Taking? Authorizing Provider  acetaminophen (TYLENOL) 650 MG CR tablet Take 1 tablet (650 mg total) by mouth daily as needed for pain. 03/17/17   Mariel Aloe, MD  amLODipine (NORVASC) 10 MG tablet Take 1 tablet (10 mg total) by mouth daily. 07/14/17   Dorothy Spark, MD  cloNIDine (CATAPRES - DOSED IN MG/24 HR) 0.3 mg/24hr Place 0.3 mg onto the skin every Sunday. Saturday.    [provider]  hydrALAZINE (APRESOLINE) 50 MG tablet Take 1 tablet (50 mg total) by mouth 3 (three) times daily. 08/18/17   Dorothy Spark, MD  hydrOXYzine (ATARAX/VISTARIL) 25 MG tablet Take 1 tablet (25 mg total) by mouth every 8 (eight) hours as needed for itching. Further refills need to come from your PCP. 04/23/17   Dorothy Spark, MD  metoprolol tartrate (LOPRESSOR) 100 MG tablet Take 100 mg by mouth 2  (two) times daily. 04/22/17   [provider]  omeprazole (PRILOSEC) 20 MG capsule Take 20 mg by mouth daily as needed (acid reflux).    [provider]  SENSIPAR 60 MG tablet Take 1 tablet (60 mg total) by mouth every Monday, Wednesday, and Friday with hemodialysis. 03/17/17   Mariel Aloe, MD  sevelamer carbonate (RENVELA) 800 MG tablet Take 3,200-4,800 mg by mouth See admin instructions. Take 4-6 tablets (3200-4800 mg) by mouth one to two times daily with meals    [provider]  tamsulosin (FLOMAX) 0.4 MG CAPS capsule Take 0.4 mg by mouth every other day.  08/01/14   [provider]    Family History Family History  Problem Relation Age of  Onset  . Hypertension Mother   . Cerebrovascular Accident Father   . Hypertension Father   . Congestive Heart Failure Brother   . Asthma Brother     Social History Social History   Tobacco Use  . Smoking status: Former Smoker    Packs/day: 0.50    Years: 16.00    Pack years: 8.00    Types: Cigarettes    Last attempt to quit: 03/03/2017    Years since quitting: 0.5  . Smokeless tobacco: Never Used  Substance Use Topics  . Alcohol use: Yes    Alcohol/week: 0.0 standard drinks    Comment: occassional  . Drug use: No     Allergies   Aspirin   Review of Systems Review of Systems  Constitutional: Positive for chills and fever.  HENT: Positive for dental problem. Negative for congestion and sore throat.   Respiratory: Negative for cough and shortness of breath.   Cardiovascular: Negative for chest pain.  Gastrointestinal: Negative for abdominal pain, nausea and vomiting.  Genitourinary: Negative for difficulty urinating and dysuria.  Musculoskeletal: Negative for back pain.  Skin: Negative for rash and wound.  Neurological: Positive for weakness. Negative for light-headedness.  Psychiatric/Behavioral: Negative for agitation.     Physical Exam Updated Vital Signs BP (!) 171/89   Pulse (!) 55    Temp 98.3 F (36.8 C) (Oral)   Resp (!) 24   SpO2 96%   Physical Exam  Constitutional: He is oriented to person, place, and time. He appears well-developed and well-nourished. No distress.  Nontoxic-appearing, no acute distress.  HENT:  Head: Normocephalic and atraumatic.  Malar appearing rash over the face. Poor oral dentition. Right lower molar cracked. No tenderness or abscess. Uvula midline. No trismus.   Eyes: Pupils are equal, round, and reactive to light. Conjunctivae are normal. Right eye exhibits no discharge. Left eye exhibits no discharge.  Neck: Normal range of motion. Neck supple.  Cardiovascular: Normal rate and regular rhythm.  Murmur (2/6 systolic murmur) heard. Pulmonary/Chest: Effort normal and breath sounds normal. No stridor. No respiratory distress. He has no wheezes. He has no rales.  Central venous catheter over the left chest. No erythema, warmth or purulence over the skin.   Abdominal: Soft. Bowel sounds are normal. There is no tenderness. There is no guarding.  Musculoskeletal:  No leg edema.  Neurological: He is alert and oriented to person, place, and time. Coordination normal.  Skin: Skin is warm and dry. He is not diaphoretic.  Psychiatric: He has a normal mood and affect. His behavior is normal.  Nursing note and vitals reviewed.    ED Treatments / Results  Labs (all labs ordered are listed, but only abnormal results are displayed) Labs Reviewed  COMPREHENSIVE METABOLIC PANEL - Abnormal; Notable for the following components:      Result Value   BUN 45 (*)    Creatinine, Ser 17.51 (*)    Albumin 3.3 (*)    AST 14 (*)    GFR calc non Af Amer 3 (*)    GFR calc Af Amer 3 (*)    Anion gap 17 (*)    All other components within normal limits  CBC WITH DIFFERENTIAL/PLATELET - Abnormal; Notable for the following components:   RBC 3.39 (*)    Hemoglobin 9.4 (*)    HCT 30.3 (*)    RDW 16.7 (*)    All other components within normal limits  CULTURE,  BLOOD (ROUTINE X 2)  CULTURE, BLOOD (ROUTINE X  2)  URINALYSIS, ROUTINE W REFLEX MICROSCOPIC  I-STAT CG4 LACTIC ACID, ED  I-STAT CG4 LACTIC ACID, ED    EKG None  Radiology Dg Chest 2 View  Result Date: 09/11/2017 CLINICAL DATA:  Bacteremia.  Chills and fatigue. EXAM: CHEST - 2 VIEW COMPARISON:  Chest x-ray dated March 13, 2017. FINDINGS: Unchanged tunneled right internal with the tip jugular dialysis catheter near the cavoatrial junction. Unchanged mild cardiomegaly. Atherosclerotic calcification of the aortic arch. Normal pulmonary vascularity. Mild bibasilar atelectasis. No focal consolidation, pleural effusion, or pneumothorax. No acute osseous abnormality. IMPRESSION: Mild bibasilar atelectasis. Electronically Signed   By: Titus Dubin M.D.   On: 09/11/2017 11:22    Procedures Procedures (including critical care time)  Medications Ordered in ED Medications - No data to display   Initial Impression / Assessment and Plan / ED Course  I have reviewed the triage vital signs and the nursing notes.  Pertinent labs & imaging results that were available during my care of the patient were reviewed by me and considered in my medical decision making (see chart for details).     Patient presents to the ED after reportedly having had positive blood cultures drawn at his dialysis center two days ago.  Unfortunately no record of culture result in the chart.  Patient is afebrile and nontoxic in the ED.  Central venous catheter site on the right chest without evidence of infection over the skin.  He does have poor dentition with a cracked left lower molar which may be source. No oral abscess. Results reviewed. No leukocytosis or lactic acidosis.  Repeat cultures drawn in the ED.   Discussed this patient with Nephrologist Dr. Jonnie Finner who would like patient admitted to medicine. He reports that blood culture grew gram negative rods from dialysis center. He is planning for patient to have dialysis  today in the hospital and antibiotics following. He reports that patient will need to have his central catheter removed sometime this weekend.   Discussed this patient with Dr. Tamala Julian with Hospitalist service who will admit. Patient informed.   Final Clinical Impressions(s) / ED Diagnoses   Final diagnoses:  None    ED Discharge Orders    None       Bernarda Caffey 09/11/17 1656    Davonna Belling, MD 09/12/17 [phone]

## 2017-09-11 NOTE — Progress Notes (Signed)
Pharmacy Antibiotic Note  James Wall is a 52 y.o. male admitted on 09/11/2017 with bacteremia.  Pharmacy has been consulted for ceftazidime dosing. Pt is afebrile and WBC is WNL. Lactic acid is <1. Pt with GNR growing in outpatient blood cultures. Planning HD today.   Plan: Ceftazidime 2gm IV x 1 then 547m post-HD today Ceftazidime 1gm IV Q24H starting tomorrow evening F/u renal plans, C&S, clinical status and LOT     Temp (24hrs), Avg:98.3 F (36.8 C), Min:98.2 F (36.8 C), Max:98.3 F (36.8 C)  Recent Labs  Lab 09/11/17 1027 09/11/17 1051  WBC 7.6  --   CREATININE 17.51*  --   LATICACIDVEN  --  0.94    CrCl cannot be calculated (Unknown ideal weight.).    Allergies  Allergen Reactions  . Aspirin Other (See Comments)    Made congestion worse    Antimicrobials this admission: Ceftaz 8/9>>  Dose adjustments this admission: N/A  Microbiology results: 8/8 Blood - GNR  Thank you for allowing pharmacy to be a part of this patient's care.  Violett Hobbs, RRande Lawman8/10/2017 2:02 PM

## 2017-09-11 NOTE — ED Triage Notes (Signed)
Pt in stating he has had chills recently and decreased appetite and fatigue, they did blood cultures at dialysis on Wednesday and they came back positive, pt sent here for further evaluation. Denies fever. Last dialysis on Wednesday, did not have treatment today

## 2017-09-11 NOTE — Progress Notes (Signed)
Hemodialysis Catheter Removal Procedure Note James Wall [id number] Jun 28, 1965  Procedure: Removal of Hemodialysis Catheter Indications: Dialysis Access   Catheter removal was requested by Dr. Jonnie Finner and per history patient has consistently been having rigors on dialysis suggestive of a catheter related infection. Discussed with Dr. Jonnie Finner and I will release the cuff today -> HD today --> Dr. Jonnie Finner to remove the catheter tomorrow.  Procedure Details Consent: Risks of procedure as well as the alternatives and risks of each were explained to the (patient/caregiver).  Consent for procedure obtained. Time Out: Verified patient identification, verified procedure, site/side was marked, verified correct patient position, special equipment/implants available, medications/allergies/relevent history reviewed, required imaging and test results available.  Performed  Maximum sterile technique was used including antiseptics, gloves, hand hygiene, mask and sheet. Skin prep: Chlorhexidine; local anesthetic administered  Cuff was ~1" deep to the exit site. After generous infiltration with Lidocaine + sterile towels I was able to release the cuff by blunt dissection. I did not see any pus and I secured the catheter with 4x4 + opsite + tape with the cuff just external to the exit site.  Again, Dr. Jonnie Finner to remove tomorrow AM after HD today.  Evaluation Complications: No apparent complications Patient did tolerate procedure well.    [name], MD 09/11/2017

## 2017-09-11 NOTE — ED Notes (Signed)
Pt reports having chills during dialysis and is cold for a couple hours after treatment. He reports having to wear a winter coat because he is so cold during treatment.   Pt is a MWF schedule but did not go today.

## 2017-09-11 NOTE — ED Notes (Signed)
Per hemodialysis nurse, do not give bp meds before dialysis.

## 2017-09-11 NOTE — ED Notes (Signed)
Pt did not take his blood pressure medications this morning.

## 2017-09-11 NOTE — ED Triage Notes (Signed)
Pt reports he has had some infection in his mouth and is waiting for evaluation at a dentist, he wonders if that is the source of infection

## 2017-09-11 NOTE — H&P (Signed)
History and Physical    Kervin Bones PNP:[id number] DOB: [birth year] DOA: 09/11/2017  Referring MD/NP/PA: Geanie Kenning, PA-C PCP: Sherene Sires, DO  Patient coming from: Dialysis  Chief Complaint: Chills  I have personally briefly reviewed patient's old medical records in [place]   HPI: James Wall is a 52 y.o. male with medical history significant of ESRD on HD(M/W/F) followed by Dr. Carmina Miller of nephrology, HTN, COPD, anemia, and non alcoholic cirrhosis; who presents with complaints of chills with hemodialysis sessions.  Patient reports symptoms have been present for approximately 4 weeks.  He has been wearing extra clothing during sessions to prevent chills. Associated symptoms include generalized weakness and decreased appetite for several hours following hemodialysis sessions.  Notes intermittent nausea and vomiting.  He has had some issue with his right lower molar for which he is in the process of being set up to be seen by dentistry.  Patient has had issues with access in the past and has a central vascular catheter last placed in February 2019.  Patient normally has been having dressing change for the central venous catheter during hemodialysis sessions.  He has had some itching from the new tape that they have using.  Blood cultures were just obtained 2 days ago.  He was called today and told to come to the hospital as blood cultures were noted to be positive.  ED Course: On admission to the emergency department patient was seen to be afebrile, blood pressure 174/99-190/124, and O2 saturations maintained on room air.  Labs appeared relatively unchanged from his baseline.  Dr. Tessa Lerner of nephrology was consulted.  Blood cultures were reported to have shown gram-negative rods.  Plan is to dialyze patient and give IV antibiotics following with likely need of removal patient's central venous catheter at some point in time.  TRH called to admit.   Review of Systems    Constitutional: Positive for chills and malaise/fatigue. Negative for fever.  HENT: Negative for ear discharge and ear pain.   Eyes: Negative for pain and discharge.  Respiratory: Negative for cough and shortness of breath.   Cardiovascular: Negative for chest pain, claudication and leg swelling.  Gastrointestinal: Positive for nausea and vomiting. Negative for abdominal pain and diarrhea.  Genitourinary: Negative for dysuria and hematuria.  Musculoskeletal: Negative for falls and myalgias.  Skin: Positive for itching. Negative for rash.  Neurological: Negative for loss of consciousness and headaches.  Endo/Heme/Allergies: Negative for polydipsia.  Psychiatric/Behavioral: Negative for memory loss and suicidal ideas.    Past Medical History:  Diagnosis Date  . Anemia   . Anxiety   . Arthritis    GOUT - pt not sure if this is true  . AV fistula occlusion ([place]) 10/2015  . Cirrhosis, nonalcoholic ([place])   . COPD (chronic obstructive pulmonary disease) ([place])   . Depression   . ESRD on hemodialysis [facility])    Started HD Jan 2009.  ESRD was due to HTN.  Dx'd with HTN in hospital 1996 according to pt, they had to keep him so he could get Medicaid to afford the BP medications.  First saw a nephrologist and started HD in the same year 2009.  Gets HD at [facility] on a MWF schedule.  Does not have DM. He had a left RC AVF that never functioned, a left upper arm AVF that worked for about 5 years and as of June  . GERD (gastroesophageal reflux disease)   . Hypertension   . Pneumonia 03/2017  .  Renal insufficiency   . Secondary hyperparathyroidism ([place])   . Sepsis ([place]) 02/2013   from AVF , treated with Vancomycin.  Marland Kitchen Shortness of breath    With exertion  . Sleep apnea    no  longer using cpap    Past Surgical History:  Procedure Laterality Date  . AV FISTULA PLACEMENT  2009   Left lower arm AVF  . AV FISTULA PLACEMENT Right 02/22/2013   Procedure:  CREATION  OF BRACHIAL CEPHALIC FISTULA  RIGHT ARM;  Surgeon: Elam Dutch, MD;  Location: [place];  Service: Vascular;  Laterality: Right;  . AV FISTULA PLACEMENT Left 08/10/2014   Procedure: BASILIC VEIN TRANSPOSITION  ARTERIOVENOUS (AV) FISTULA CREATION LEFT UPPER ARM;  Surgeon: Mal Misty, MD;  Location: [place];  Service: Vascular;  Laterality: Left;  . COLONOSCOPY    . ESOPHAGOGASTRODUODENOSCOPY (EGD) WITH PROPOFOL N/A 04/12/2013   Procedure: ESOPHAGOGASTRODUODENOSCOPY (EGD) WITH PROPOFOL;  Surgeon: Arta Silence, MD;  Location: [facility];  Service: Endoscopy;  Laterality: N/A;  . INSERTION OF DIALYSIS CATHETER N/A 12/23/2012   Procedure: INSERTION OF DIALYSIS CATHETER; ULTRASOUND GUIDED;  Surgeon: Angelia Mould, MD;  Location[facility] OR;  Service: Vascular;  Laterality: N/A;  . INSERTION OF DIALYSIS CATHETER  10/22/2015   Right IJ non-tunneled HD catheter  . IR GENERIC HISTORICAL  10/22/2015   IR US GUIDE VASC ACCESS RIGHT 10/22/2015 [facility]  . IR GENERIC HISTORICAL  10/22/2015   IR FLUORO GUIDE CV LINE RIGHT 10/22/2015 [facility]  . IR GENERIC HISTORICAL  10/23/2015   IR FLUORO GUIDE CV LINE RIGHT 10/23/2015 Marybelle Killings, MD [facility]  . LEFT HEART CATHETERIZATION WITH CORONARY ANGIOGRAM N/A 07/13/2013   Procedure: LEFT HEART CATHETERIZATION WITH CORONARY ANGIOGRAM;  Surgeon: Jettie Booze, MD;  Location: [facility] C[facility] LAB;  Service: Cardiovascular;  Laterality: N/A;  . LIGATION OF ARTERIOVENOUS  FISTULA Left 12/22/2012   Procedure: LIGATION OF ARTERIOVENOUS  FISTULA;EXCISION OF LARGE ANEURYSMS;;  Surgeon: Elam Dutch, MD;  Location: [place];  Service: Vascular;  Laterality: Left;     reports that he quit smoking about 6 months ago. His smoking use included cigarettes. He has a 8.00 pack-year smoking history. He has never used smokeless tobacco. He reports that he drinks alcohol. He reports that he does not use drugs.  Allergies  Allergen Reactions  . Aspirin Other (See Comments)    Made congestion worse      Family History  Problem Relation Age of Onset  . Hypertension Mother   . Cerebrovascular Accident Father   . Hypertension Father   . Congestive Heart Failure Brother   . Asthma Brother     Prior to Admission medications   Medication Sig Start Date End Date Taking? Authorizing Provider  acetaminophen (TYLENOL) 650 MG CR tablet Take 1 tablet (650 mg total) by mouth daily as needed for pain. 03/17/17  Yes Mariel Aloe, MD  amLODipine (NORVASC) 10 MG tablet Take 1 tablet (10 mg total) by mouth daily. 07/14/17  Yes Dorothy Spark, MD  cloNIDine (CATAPRES - DOSED IN MG/24 HR) 0.3 mg/24hr Place 0.3 mg onto the skin every Sunday. Saturday.   Yes [provider]  hydrALAZINE (APRESOLINE) 50 MG tablet Take 1 tablet (50 mg total) by mouth 3 (three) times daily. Patient taking differently: Take 100 mg by mouth 3 (three) times daily.  08/18/17  Yes Dorothy Spark, MD  hydrOXYzine (ATARAX/VISTARIL) 25 MG tablet Take 1 tablet (25 mg total) by mouth every  8 (eight) hours as needed for itching. Further refills need to come from your PCP. 04/23/17  Yes Dorothy Spark, MD  metoprolol tartrate (LOPRESSOR) 100 MG tablet Take 100 mg by mouth 2 (two) times daily. 04/22/17  Yes [provider]  omeprazole (PRILOSEC) 20 MG capsule Take 20 mg by mouth daily as needed (acid reflux).   Yes [provider]  SENSIPAR 60 MG tablet Take 1 tablet (60 mg total) by mouth every Monday, Wednesday, and Friday with hemodialysis. Patient taking differently: Take 60 mg by mouth every Monday, Wednesday, and Friday with hemodialysis. Liquid they put in his IV at dialysys 03/17/17  Yes Mariel Aloe, MD  sevelamer carbonate (RENVELA) 800 MG tablet Take 3,200-4,800 mg by mouth See admin instructions. Take 4-6 tablets (3200-4800 mg) by mouth one to two times daily with meals   Yes [provider]  tamsulosin (FLOMAX) 0.4 MG CAPS capsule Take 0.4 mg by mouth every other day.  08/01/14    [provider]    Physical Exam:  Constitutional: NAD, calm, comfortable Vitals:   09/11/17 1145 09/11/17 1215 09/11/17 1230 09/11/17 1245  BP: (!) 174/99 (!) 185/99 (!) 181/99 (!) 182/110  Pulse: 63 61 (!) 57 63  Resp: (!) 21 13 10 16   Temp:      TempSrc:      SpO2: 100% 94% 96% 97%   Eyes: PERRL, lids and conjunctivae normal ENMT: Mucous membranes are moist. Posterior pharynx clear of any exudate or lesions.multiple dental caries present.   Neck: normal, supple, no masses, no thyromegaly. No significant submandibular adenopathy noted. Respiratory: clear to auscultation bilaterally, no wheezing, no crackles. Normal respiratory effort. No accessory muscle use.  Cardiovascular: Regular rate and rhythm, no murmurs / rubs / gallops. No extremity edema. 2+ pedal pulses. No carotid bruits.  Right-sided chest central venous catheter in place.  Left upper extremity fistula. Abdomen: no tenderness, no masses palpated. No hepatosplenomegaly. Bowel sounds positive.  Musculoskeletal: no clubbing / cyanosis. No joint deformity upper and lower extremities. Good ROM, no contractures. Normal muscle tone.  Skin: no rashes, lesions, ulcers. No induration Neurologic: CN 2-12 grossly intact. Sensation intact, DTR normal. Strength 5/5 in all 4.  Psychiatric: Normal judgment and insight. Alert and oriented x 3. Normal mood.     Labs on Admission: I have personally reviewed following labs and imaging studies  CBC: Recent Labs  Lab 09/11/17 1027  WBC 7.6  NEUTROABS 5.0  HGB 9.4*  HCT 30.3*  MCV 89.4  PLT 166   Basic Metabolic Panel: Recent Labs  Lab 09/11/17 1027  NA 140  K 4.7  CL 98  CO2 25  GLUCOSE 90  BUN 45*  CREATININE 17.51*  CALCIUM 10.0   GFR: CrCl cannot be calculated (Unknown ideal weight.). Liver Function Tests: Recent Labs  Lab 09/11/17 1027  AST 14*  ALT 8  ALKPHOS 40  BILITOT 1.1  PROT 7.2  ALBUMIN 3.3*   No results for input(s): LIPASE, AMYLASE in  the last 168 hours. No results for input(s): AMMONIA in the last 168 hours. Coagulation Profile: No results for input(s): INR, PROTIME in the last 168 hours. Cardiac Enzymes: No results for input(s): CKTOTAL, CKMB, CKMBINDEX, TROPONINI in the last 168 hours. BNP (last 3 results) No results for input(s): PROBNP in the last 8760 hours. HbA1C: No results for input(s): HGBA1C in the last 72 hours. CBG: No results for input(s): GLUCAP in the last 168 hours. Lipid Profile: No results for input(s): CHOL,  HDL, LDLCALC, TRIG, CHOLHDL, LDLDIRECT in the last 72 hours. Thyroid Function Tests: No results for input(s): TSH, T4TOTAL, FREET4, T3FREE, THYROIDAB in the last 72 hours. Anemia Panel: No results for input(s): VITAMINB12, FOLATE, FERRITIN, TIBC, IRON, RETICCTPCT in the last 72 hours. Urine analysis:    Component Value Date/Time   COLORURINE STRAW (A) 03/08/2017 1357   APPEARANCEUR CLEAR 03/08/2017 1357   LABSPEC 1.008 03/08/2017 1357   PHURINE 9.0 (H) 03/08/2017 1357   GLUCOSEU NEGATIVE 03/08/2017 1357   HGBUR SMALL (A) 03/08/2017 1357   BILIRUBINUR NEGATIVE 03/08/2017 1357   KETONESUR NEGATIVE 03/08/2017 1357   PROTEINUR NEGATIVE 03/08/2017 1357   NITRITE NEGATIVE 03/08/2017 1357   LEUKOCYTESUR NEGATIVE 03/08/2017 1357   Sepsis Labs: No results found for this or any previous visit (from the past 240 hour(s)).   Radiological Exams on Admission: Dg Chest 2 View  Result Date: 09/11/2017 CLINICAL DATA:  Bacteremia.  Chills and fatigue. EXAM: CHEST - 2 VIEW COMPARISON:  Chest x-ray dated March 13, 2017. FINDINGS: Unchanged tunneled right internal with the tip jugular dialysis catheter near the cavoatrial junction. Unchanged mild cardiomegaly. Atherosclerotic calcification of the aortic arch. Normal pulmonary vascularity. Mild bibasilar atelectasis. No focal consolidation, pleural effusion, or pneumothorax. No acute osseous abnormality. IMPRESSION: Mild bibasilar atelectasis.  Electronically Signed   By: Titus Dubin M.D.   On: 09/11/2017 11:22    Chest x-ray: Independently reviewed. Right tunneled catheter present with bibasilar atelectasis.  Assessment/Plan Bacteremia: Acute.  Patient presents with complaints of generalized weakness and chills on hemodialysis sessions. Possible source tunneled hemodialysis catheter or possible tooth infection. - Admit to a telemetry bed - Follow-up repeat blood cultures and from outside facility - [place] per pharmacy  ESRD on HD: Patient normally dialyzes M/W/F.  Central venous catheter last placed in February 2019. - Continue current medication regimen - Appreciate nephrology consultative services  - HD per nephrology  Hypertensive urgency: On admission blood pressures elevated up to 190/124.  Patient had not taken all of his regularly scheduled medications at this time. - Continue clonidine, amlodipine, hydralazine, and metoprolol - hydralazine IV prn elevated blood pressures.  BPH - Continue tamsulosin  Tobacco Abuse - Counseled on the need to refrain from tobacco use,  DVT prophylaxis: Heparin Code Status: Full Family Communication: No family present at bedside Disposition Plan: Discharge home once medically stable in 2 to 3 days Consults called: Nephrology Admission status: Inpatient  Norval Morton MD [facility] Pager [phone]   If 7PM-7AM, please contact night-coverage [email or web address] Password [email or web address]  09/11/2017, 1:36 PM

## 2017-09-12 DIAGNOSIS — N2581 Secondary hyperparathyroidism of renal origin: Secondary | ICD-10-CM | POA: Diagnosis not present

## 2017-09-12 DIAGNOSIS — K7460 Unspecified cirrhosis of liver: Secondary | ICD-10-CM | POA: Diagnosis not present

## 2017-09-12 DIAGNOSIS — G4730 Sleep apnea, unspecified: Secondary | ICD-10-CM | POA: Diagnosis not present

## 2017-09-12 DIAGNOSIS — N186 End stage renal disease: Secondary | ICD-10-CM | POA: Diagnosis not present

## 2017-09-12 DIAGNOSIS — D631 Anemia in chronic kidney disease: Secondary | ICD-10-CM | POA: Diagnosis not present

## 2017-09-12 DIAGNOSIS — R7881 Bacteremia: Secondary | ICD-10-CM | POA: Diagnosis not present

## 2017-09-12 DIAGNOSIS — N400 Enlarged prostate without lower urinary tract symptoms: Secondary | ICD-10-CM | POA: Diagnosis present

## 2017-09-12 DIAGNOSIS — T827XXA Infection and inflammatory reaction due to other cardiac and vascular devices, implants and grafts, initial encounter: Principal | ICD-10-CM

## 2017-09-12 DIAGNOSIS — I12 Hypertensive chronic kidney disease with 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: Secondary | ICD-10-CM | POA: Diagnosis not present

## 2017-09-12 DIAGNOSIS — R6883 Chills (without fever): Secondary | ICD-10-CM | POA: Diagnosis not present

## 2017-09-12 DIAGNOSIS — Z992 Dependence on renal dialysis: Secondary | ICD-10-CM | POA: Diagnosis not present

## 2017-09-12 LAB — RENAL FUNCTION PANEL
Albumin: 2.8 g/dL — ABNORMAL LOW (ref 3.5–5.0)
Anion gap: 11 (ref 5–15)
BUN: 24 mg/dL — ABNORMAL HIGH (ref 6–20)
CO2: 27 mmol/L (ref 22–32)
Calcium: 9.2 mg/dL (ref 8.9–10.3)
Chloride: 99 mmol/L (ref 98–111)
Creatinine, Ser: 11.1 mg/dL — ABNORMAL HIGH (ref 0.61–1.24)
GFR calc Af Amer: 5 mL/min — ABNORMAL LOW (ref >60)
GFR calc non Af Amer: 5 mL/min — ABNORMAL LOW (ref >60)
Glucose, Bld: 103 mg/dL — ABNORMAL HIGH (ref 70–99)
Phosphorus: 3.4 mg/dL (ref 2.5–4.6)
Potassium: 3.9 mmol/L (ref 3.5–5.1)
Sodium: 137 mmol/L (ref 135–145)

## 2017-09-12 LAB — CBC
HCT: 29.2 % — ABNORMAL LOW (ref 39.0–52.0)
Hemoglobin: 8.9 g/dL — ABNORMAL LOW (ref 13.0–17.0)
MCH: 27.1 pg (ref 26.0–34.0)
MCHC: 30.5 g/dL (ref 30.0–36.0)
MCV: 89 fL (ref 78.0–100.0)
Platelets: 148 10*3/uL — ABNORMAL LOW (ref 150–400)
RBC: 3.28 MIL/uL — ABNORMAL LOW (ref 4.22–5.81)
RDW: 16.8 % — ABNORMAL HIGH (ref 11.5–15.5)
WBC: 12.3 10*3/uL — ABNORMAL HIGH (ref 4.0–10.5)

## 2017-09-12 MED ORDER — CEFTAZIDIME 1 G IJ SOLR
1.0000 g | Freq: Once | INTRAMUSCULAR | Status: AC
  Administered 2017-09-12: 1 g via INTRAVENOUS
  Filled 2017-09-12 (×2): qty 1

## 2017-09-12 MED ORDER — HYDRALAZINE HCL 100 MG PO TABS: 100.0000 mg | ORAL_TABLET | Freq: Three times a day (TID) | ORAL | 0 refills | Status: DC

## 2017-09-12 MED ORDER — LORAZEPAM 2 MG/ML IJ SOLN
INTRAMUSCULAR | Status: AC
  Administered 2017-09-12: 1 mg via INTRAVENOUS
  Filled 2017-09-12: qty 1

## 2017-09-12 MED ORDER — LORAZEPAM 2 MG/ML IJ SOLN
1.0000 mg | Freq: Once | INTRAMUSCULAR | Status: AC
  Administered 2017-09-12: 1 mg via INTRAVENOUS

## 2017-09-12 NOTE — Progress Notes (Signed)
Pt left the unit without permission - he refused to eat the meal provided by the hospital d/t it being "ice cold" and states he went to find something "hot" to eat.  Pt removed himself from the tele monitoring system and left the unit with a visitor, after dressing self in his own personal clothing.  Pt has been told by Dr. Jonnie Finner that it is ok for him to go home after getting his next dose of IV ATB.  Pt returns to his room for IV ATB w/warm danishes - requests that his Renvela be given now, which was administered.  Pt refusing tele monitoring.

## 2017-09-12 NOTE — Discharge Summary (Signed)
Physician Discharge Summary  James Wall WVP:[id number] DOB: Oct 18, 1965 DOA: 09/11/2017  PCP: Sherene Sires, DO  Admit date: 09/11/2017 Discharge date: 09/12/2017  Recommendations for Outpatient Follow-up:  Catheter-related bacteremia - growing Klebs Pneumoniae pan- sensitive except ampicillin.   Removed catheter this am 09/12/17 Will give 1gm IV Fortaz prior to dc. Patient will get 2 wks of IV fortaz at the OP HD unit.   Patient is to go to [facility] this Monday 09/14/17 at 7:15am for a new TDC; not supposed to eat after midnight on Sunday  Discharge Diagnoses:  Principal Problem:   Bacteremia associated with intravascular line ([place]) Active Problems:   ESRD on dialysis [facility])   Hypertensive urgency   BPH (benign prostatic hyperplasia)    Discharge Condition: stable   Diet recommendation: as tolerated   History of present illness:   Hospital Course:  52 y.o. male with medical history significant of ESRD on HD(M/W/F) followed by Dr. Carmina Miller of nephrology, HTN, COPD, anemia, non alcoholic cirrhosis who presented with chills during hemodialysis sessions, intermittent nausea and vomiting.  He has had some issue with his right lower molar for which he is in the process of being set up to be seen by dentistry.  Patient has had issues with access in the past and has a central vascular catheter last placed in February 2019.  Patient normally has been having dressing change for the central venous catheter during hemodialysis sessions.  He has had some itching from the new tape that they have using.  Blood cultures were just obtained 2 days prior to the admission.  He was called today and told to come to the hospital as blood cultures were noted to be positive.   Principal Problem:   Bacteremia associated with intravascular line ([place]) Catheter-related bacteremia - growing Klebs Pneumoniae pan- sensitive except ampicillin.   Removed catheter this am 09/12/17 Will give 1gm IV Fortaz prior to  dc. Patient will get 2 wks of IV fortaz at the OP HD unit.   Patient is to go to [facility] this Monday 09/14/17 at 7:15am for a new TDC; not supposed to eat after midnight on Sunday  Active Problems:   ESRD on dialysis [facility]) - HD per renal sch    Hypertensive urgency - May continue clonidine, hydralazine, metoprolol, norvasc   Signed:  Leisa Lenz, MD  [facility] 09/12/2017, 1:31 PM  Pager #: [phone]  Time spent in minutes:  35 minutes    Discharge Exam: Vitals:   09/12/17 0757 09/12/17 1103  BP: (!) 141/82 (!) 141/82  Pulse: (!) 56 60  Resp: (!) 21 17  Temp: 98.4 F (36.9 C) 98.2 F (36.8 C)  SpO2: 98% 98%   Vitals:   09/12/17 0000 09/12/17 0400 09/12/17 0757 09/12/17 1103  BP: 124/70 117/60 (!) 141/82 (!) 141/82  Pulse: 76 (!) 54 (!) 56 60  Resp: 18 13 (!) 21 17  Temp:  98.2 F (36.8 C) 98.4 F (36.9 C) 98.2 F (36.8 C)  TempSrc:  Oral Oral Oral  SpO2: 96% 99% 98% 98%  Weight:      Height:        General: Pt is alert, follows commands appropriately, not in acute distress Cardiovascular: Regular rate and rhythm, S1/S2 +, no murmurs Respiratory: Clear to auscultation bilaterally, no wheezing, no crackles, no rhonchi Abdominal: Soft, non tender, non distended, bowel sounds +, no guarding Extremities: no edema, no cyanosis, pulses palpable bilaterally DP and PT Neuro: Grossly nonfocal  Discharge Instructions  Discharge Instructions    Call MD for:  persistant nausea and vomiting   Complete by:  As directed    Call MD for:  redness, tenderness, or signs of infection (pain, swelling, redness, odor or green/yellow discharge around incision site)   Complete by:  As directed    Call MD for:  severe uncontrolled pain   Complete by:  As directed    Diet - low sodium heart healthy   Complete by:  As directed    Discharge instructions   Complete by:  As directed    Catheter-related bacteremia - growing Klebs Pneumoniae pan- sensitive except  ampicillin.   Removed catheter this am 09/12/17 Will give 1gm IV Fortaz prior to dc. Patient will get 2 wks of IV fortaz at the OP HD unit.   Patient is to go to [facility] this Monday 09/14/17 at 7:15am for a new TDC; not supposed to eat after midnight on Sunday   Increase activity slowly   Complete by:  As directed      Allergies as of 09/12/2017      Reactions   Aspirin Other (See Comments)   Made congestion worse      Medication List    TAKE these medications   acetaminophen 650 MG CR tablet Commonly known as:  TYLENOL Take 1 tablet (650 mg total) by mouth daily as needed for pain.   amLODipine 10 MG tablet Commonly known as:  NORVASC Take 1 tablet (10 mg total) by mouth daily.   cloNIDine 0.3 mg/24hr patch Commonly known as:  CATAPRES - Dosed in mg/24 hr Place 0.3 mg onto the skin every Sunday. Saturday.   hydrALAZINE 100 MG tablet Commonly known as:  APRESOLINE Take 1 tablet (100 mg total) by mouth 3 (three) times daily. What changed:    medication strength  how much to take   hydrOXYzine 25 MG tablet Commonly known as:  ATARAX/VISTARIL Take 1 tablet (25 mg total) by mouth every 8 (eight) hours as needed for itching. Further refills need to come from your PCP.   metoprolol tartrate 100 MG tablet Commonly known as:  LOPRESSOR Take 100 mg by mouth 2 (two) times daily.   omeprazole 20 MG capsule Commonly known as:  PRILOSEC Take 20 mg by mouth daily as needed (acid reflux).   SENSIPAR 60 MG tablet Generic drug:  cinacalcet Take 1 tablet (60 mg total) by mouth every Monday, Wednesday, and Friday with hemodialysis. What changed:  additional instructions   sevelamer carbonate 800 MG tablet Commonly known as:  RENVELA Take 3,200-4,800 mg by mouth See admin instructions. Take 4-6 tablets (3200-4800 mg) by mouth one to two times daily with meals   tamsulosin 0.4 MG Caps capsule Commonly known as:  FLOMAX Take 0.4 mg by mouth every other day.      Follow-up  Information    Sherene Sires, DO. Schedule an appointment as soon as possible for a visit in 1 week(s).   Specialty:  Family Medicine Contact information: [address] [place] [address] [phone]            The results of significant diagnostics from this hospitalization (including imaging, microbiology, ancillary and laboratory) are listed below for reference.    Significant Diagnostic Studies: Dg Chest 2 View  Result Date: 09/11/2017 CLINICAL DATA:  Bacteremia.  Chills and fatigue. EXAM: CHEST - 2 VIEW COMPARISON:  Chest x-ray dated March 13, 2017. FINDINGS: Unchanged tunneled right internal with the tip jugular dialysis catheter near  the cavoatrial junction. Unchanged mild cardiomegaly. Atherosclerotic calcification of the aortic arch. Normal pulmonary vascularity. Mild bibasilar atelectasis. No focal consolidation, pleural effusion, or pneumothorax. No acute osseous abnormality. IMPRESSION: Mild bibasilar atelectasis. Electronically Signed   By: Titus Dubin M.D.   On: 09/11/2017 11:22    Microbiology: Recent Results (from the past 240 hour(s))  Blood culture (routine x 2)     Status: None (Preliminary result)   Collection Time: 09/11/17 10:42 AM  Result Value Ref Range Status   Specimen Description BLOOD SITE NOT SPECIFIED  Final   Special Requests   Final    BOTTLES DRAWN AEROBIC AND ANAEROBIC Blood Culture results may not be optimal due to an inadequate volume of blood received in culture bottles   Culture   Final    NO GROWTH < 24 HOURS Performed at [facility], [address], [place], [place] [address]    Report Status PENDING  Incomplete  Blood culture (routine x 2)     Status: None (Preliminary result)   Collection Time: 09/11/17 12:42 PM  Result Value Ref Range Status   Specimen Description BLOOD RIGHT FOREARM  Final   Special Requests   Final    BOTTLES DRAWN AEROBIC AND ANAEROBIC Blood Culture adequate volume   Culture   Final    NO  GROWTH < 24 HOURS Performed at [facility], [place] [address] [place], [place] [address]    Report Status PENDING  Incomplete  MRSA PCR Screening     Status: None   Collection Time: 09/11/17  8:22 PM  Result Value Ref Range Status   MRSA by PCR NEGATIVE NEGATIVE Final    Comment:        The GeneXpert MRSA Assay (FDA approved for NASAL specimens only), is one component of a comprehensive MRSA colonization surveillance program. It is not intended to diagnose MRSA infection nor to guide or monitor treatment for MRSA infections. Performed at [facility], [place] [address], [place], [place] [address]      Labs: Basic Metabolic Panel: Recent Labs  Lab 09/11/17 1027 09/12/17 0213  NA 140 137  K 4.7 3.9  CL 98 99  CO2 25 27  GLUCOSE 90 103*  BUN 45* 24*  CREATININE 17.51* 11.10*  CALCIUM 10.0 9.2  PHOS  --  3.4   Liver Function Tests: Recent Labs  Lab 09/11/17 1027 09/12/17 0213  AST 14*  --   ALT 8  --   ALKPHOS 40  --   BILITOT 1.1  --   PROT 7.2  --   ALBUMIN 3.3* 2.8*   No results for input(s): LIPASE, AMYLASE in the last 168 hours. No results for input(s): AMMONIA in the last 168 hours. CBC: Recent Labs  Lab 09/11/17 1027 09/12/17 0213  WBC 7.6 12.3*  NEUTROABS 5.0  --   HGB 9.4* 8.9*  HCT 30.3* 29.2*  MCV 89.4 89.0  PLT 209 148*   Cardiac Enzymes: No results for input(s): CKTOTAL, CKMB, CKMBINDEX, TROPONINI in the last 168 hours. BNP: BNP (last 3 results) No results for input(s): BNP in the last 8760 hours.  ProBNP (last 3 results) No results for input(s): PROBNP in the last 8760 hours.  CBG: No results for input(s): GLUCAP in the last 168 hours.

## 2017-09-12 NOTE — Progress Notes (Signed)
Patient is somewhat agitated this morning; refuses Renvela d/t cold food and inability to eat anything that is offered per patient choice.  Patient is adamant he is going home today.  States he does not want to stay here.  Await nephrology to remove HD line.  Will consult w/attending during rounds.

## 2017-09-12 NOTE — Progress Notes (Signed)
[facility] Progress Note  Subjective: no c/o. 103 temp spike yesterday evening. Had HD yest w/ chills and rigors.   I removed the [facility] that Dr Augustin Coupe had loosened up yesterday.  The catheter release quite easily , no complications, help pressure several minutes w/o acute bleeding.  The outpt blood cx's are growing klebs pna, pan Sensitive except for ampicillin.   Vitals:   09/11/17 2136 09/12/17 0000 09/12/17 0400 09/12/17 0757  BP: (!) 189/115 124/70 117/60 (!) 141/82  Pulse:  76 (!) 54 (!) 56  Resp:  18 13 (!) 21  Temp:   98.2 F (36.8 C) 98.4 F (36.9 C)  TempSrc:   Oral Oral  SpO2:  96% 99% 98%  Weight:      Height:        Inpatient medications: . amLODipine  10 mg Oral Daily  . Chlorhexidine Gluconate Cloth  6 each Topical Q0600  . [START ON 09/14/2017] cinacalcet  60 mg Oral Q M,W,F-HD  . [START ON 09/13/2017] cloNIDine  0.3 mg Transdermal Q Sun  . heparin  5,000 Units Subcutaneous Q8H  . hydrALAZINE  100 mg Oral TID  . LORazepam  1-2 mg Intravenous Once  . metoprolol tartrate  100 mg Oral BID  . sevelamer carbonate  3,200-4,800 mg Oral BID WC  . sodium chloride flush  3 mL Intravenous Q12H  . tamsulosin  0.4 mg Oral QODAY   . cefTAZidime (FORTAZ)  IV     acetaminophen **OR** acetaminophen, albuterol, hydrALAZINE, ondansetron **OR** ondansetron (ZOFRAN) IV, pantoprazole  Iron/TIBC/Ferritin/ %Sat    Component Value Date/Time   IRON 58 07/10/2013 1500   TIBC 164 (L) 07/10/2013 1500   FERRITIN 1,060 (H) 07/10/2013 1500   IRONPCTSAT 35 07/10/2013 1500    Exam: Gen alert, not in distress, no c/o No rash, cyanosis or gangrene Sclera anicteric, throat clear  No jvd or bruits R chest TDC removed just now, no sig bleeding Chest clear bilat to bases RRR no MRG Abd soft ntnd no mass or ascites +bs Ext no LE edema, no wounds or ulcers Neuro is alert, Ox 3 , nf    Home meds:  - norvasc 10 qd/ hydralazine 50 tid/ metoprolol 100 bid/ catapres patch 0.3  weekly  - PPI/ sensipar 60 tiw/ renvela ac/ flomax  [facility]: [facility]   3.5 hr    87.5kg  2/2 bath  Heparin 4000 then 1000 midrun prn - parsabiv 2.5 mg iv tiw - mircera 150 q 2wks last 7/31 - calcitriol 2.25 ug tiw po   Impression: 1  Catheter-related bacteremia - growing Klebs Pneumoniae pan- sensitive except ampicillin.  Removed catheter this am w/o difficulty.  Pt ok for dc today.  Will give 1gm IV Fortaz prior to dc. He will get 2 wks of IV fortaz at the OP HD unit.  Patient is to go to [facility] this Monday 09/14/17 at 7:15am for a new TDC; not supposed to eat after midnight on Sunday and needs to have a ride home.  2  [facility] MWF HD. Patient is catheter-dependent due to refusing to have any more permanent access put in, has been on HD about 10 yrs. Missed a lot of HD the last 3 wks thus the high BUN/Cr, better after a good long dialysis session yesterday. 3  HTN - on 4 bp meds at home. No change at dc 4  MBD ckd - cont meds 5  Anemia ckd - due for esa next week on 8/14, Hb  9's 6  Volume - no vol excess on exam, BP's up though    Plan - as above   Kelly Splinter MD [place] pager [phone]   09/12/2017, 10:58 AM   Recent Labs  Lab 09/11/17 1027 09/12/17 0213  NA 140 137  K 4.7 3.9  CL 98 99  CO2 25 27  GLUCOSE 90 103*  BUN 45* 24*  CREATININE 17.51* 11.10*  CALCIUM 10.0 9.2  PHOS  --  3.4  ALBUMIN 3.3* 2.8*   Recent Labs  Lab 09/11/17 1027  AST 14*  ALT 8  ALKPHOS 40  BILITOT 1.1  PROT 7.2   Recent Labs  Lab 09/11/17 1027 09/12/17 0213  WBC 7.6 12.3*  NEUTROABS 5.0  --   HGB 9.4* 8.9*  HCT 30.3* 29.2*  MCV 89.4 89.0  PLT 209 148*

## 2017-09-12 NOTE — Progress Notes (Signed)
Ativan 63m IVP given per verbal order of Dr. RRoney Jaffe MD for removal of tunneled HD cath to right anterior chest wall.  HD cath removed per Dr. SJonnie Finnerand pressure applied to site for 5 mins minimum.  Patient tolerated well.

## 2017-09-12 NOTE — Discharge Instructions (Signed)

## 2017-09-14 DIAGNOSIS — T8249XA Other complication of vascular dialysis catheter, initial encounter: Secondary | ICD-10-CM | POA: Diagnosis not present

## 2017-09-14 DIAGNOSIS — Z992 Dependence on renal dialysis: Secondary | ICD-10-CM | POA: Diagnosis not present

## 2017-09-14 DIAGNOSIS — N186 End stage renal disease: Secondary | ICD-10-CM | POA: Diagnosis not present

## 2017-09-14 DIAGNOSIS — I871 Compression of vein: Secondary | ICD-10-CM | POA: Diagnosis not present

## 2017-09-15 ENCOUNTER — Ambulatory Visit (INDEPENDENT_AMBULATORY_CARE_PROVIDER_SITE_OTHER): Payer: Medicare Other | Admitting: Family Medicine

## 2017-09-15 ENCOUNTER — Ambulatory Visit: Payer: Medicare Other

## 2017-09-15 ENCOUNTER — Encounter: Payer: Self-pay | Admitting: Family Medicine

## 2017-09-15 ENCOUNTER — Other Ambulatory Visit: Payer: Self-pay

## 2017-09-15 VITALS — BP 140/68 | HR 70 | Temp 98.3°F | Ht 65.0 in | Wt 194.0 lb

## 2017-09-15 DIAGNOSIS — R7881 Bacteremia: Secondary | ICD-10-CM | POA: Diagnosis not present

## 2017-09-15 DIAGNOSIS — T827XXD Infection and inflammatory reaction due to other cardiac and vascular devices, implants and grafts, subsequent encounter: Secondary | ICD-10-CM | POA: Diagnosis not present

## 2017-09-15 DIAGNOSIS — F411 Generalized anxiety disorder: Secondary | ICD-10-CM

## 2017-09-16 DIAGNOSIS — T80211A Bloodstream infection due to central venous catheter, initial encounter: Secondary | ICD-10-CM | POA: Diagnosis not present

## 2017-09-16 DIAGNOSIS — N186 End stage renal disease: Secondary | ICD-10-CM | POA: Diagnosis not present

## 2017-09-16 DIAGNOSIS — N2581 Secondary hyperparathyroidism of renal origin: Secondary | ICD-10-CM | POA: Diagnosis not present

## 2017-09-16 DIAGNOSIS — J158 Pneumonia due to other specified bacteria: Secondary | ICD-10-CM | POA: Diagnosis not present

## 2017-09-16 DIAGNOSIS — D509 Iron deficiency anemia, unspecified: Secondary | ICD-10-CM | POA: Diagnosis not present

## 2017-09-16 DIAGNOSIS — Z992 Dependence on renal dialysis: Secondary | ICD-10-CM | POA: Diagnosis not present

## 2017-09-16 LAB — CULTURE, BLOOD (ROUTINE X 2)
Culture: NO GROWTH
Culture: NO GROWTH
Special Requests: ADEQUATE

## 2017-09-17 DIAGNOSIS — F411 Generalized anxiety disorder: Secondary | ICD-10-CM | POA: Insufficient documentation

## 2017-09-17 MED ORDER — MELATONIN 3 MG PO CAPS: 3.0000 mg | ORAL_CAPSULE | Freq: Every day | ORAL | 0 refills | Status: AC

## 2017-09-17 NOTE — Assessment & Plan Note (Addendum)
Patient with hx of taking xanax the night prior to dialysis which he said helped him sleep and get through his sessions because dialysis "always stresses me out".  He said a prior pcp took him off of it "because of the opiod epidemic or something" which he says is confusing because he is not on narcotics.  We discussed that a medication like xanax can be appropriate in very specific situations but that I would like some more focused evaluation from our psychology staff to help make a treatment decision and that I thought counseling was also a valuable treatment itself.  Patient agrees to meet with Dr. Gwenlyn Saran and will call her office.  I spoke with Dr. Gwenlyn Saran and she is expecting his call.  He is willing to try melatonin for the sleep difficulty he associates with his anxiety

## 2017-09-17 NOTE — Assessment & Plan Note (Signed)
Patient recently had line changed and was started on abx with dialysis.  He understands it may be a week or two until he really stops having symptoms and his treatment is scheduled for 2 weeks.

## 2017-09-17 NOTE — Patient Instructions (Signed)
Patient left prior to being handed AVS..Marland Kitchen  It was a pleasure to see you today! Thank you for choosing [facility] for your primary care. James Wall was seen for anxiety. Come back to the clinic to see Dr. Gwenlyn Saran, and go to the emergency room if you have any life threatening issues.  Please call Dr. Gwenlyn Saran and schedule an appt for your anxiety evaluation.  Also try out the melatonin we talked about to see if it helps with your sleep.   Please bring all your medications to every doctors visit   Sign up for My Chart to have easy access to your labs results, and communication with your Primary care physician.     Please check-out at the front desk before leaving the clinic.     Best,  Dr. Sherene Sires FAMILY MEDICINE RESIDENT - PGY2 09/17/2017 8:09 AM

## 2017-09-17 NOTE — Progress Notes (Signed)
    Subjective:  James Wall is a 52 y.o. male who presents to the [facility] today with a chief complaint of anxiety.   HPI: anxiety Patient with hx of taking xanax the night prior to dialysis which he said helped him sleep and get through his sessions because dialysis "always stresses me out".  He said a prior pcp took him off of it "because of the opiod epidemic or something" which he says is confusing because he is not on narcotics.  He says he never has thoughts of selfharm or hurting others, he is safe, it's just sometimes hard to sleep when he gets anxious  Bacteremia: He updated me on plan to treat his bacteremia.  He had been complaining of chills during dialysis and after insisting on blood culture was found to have infection now being treated with abx after line change.  He did not feel he was listened to by staff during this process and is displeased with the experience.   Objective:  Physical Exam: BP 140/68   Pulse 70   Temp 98.3 F (36.8 C) (Oral)   Ht 5' 5"  (1.651 m)   Wt 194 lb (88 kg)   SpO2 97%   BMI 32.28 kg/m   Gen: NAD, resting comfortably, anxious appearing CV: RRR with no murmurs appreciated Pulm: NWOB, CTAB with no crackles, wheezes, or rhonchi MSK: no edema, cyanosis, or clubbing noted Skin: warm, dry Neuro: grossly normal, moves all extremities Psych: anxious affect and normal thought content, pleasant  No results found for this or any previous visit (from the past 72 hour(s)).   Assessment/Plan:  Bacteremia associated with intravascular line [facility]) Patient recently had line changed and was started on abx with dialysis.  He understands it may be a week or two until he really stops having symptoms and his treatment is scheduled for 2 weeks.  Generalized anxiety disorder Patient with hx of taking xanax the night prior to dialysis which he said helped him sleep and get through his sessions because dialysis "always stresses me out".  He said a prior pcp  took him off of it "because of the opiod epidemic or something" which he says is confusing because he is not on narcotics.  We discussed that a medication like xanax can be appropriate in very specific situations but that I would like some more focused evaluation from our psychology staff to help make a treatment decision and that I thought counseling was also a valuable treatment itself.  Patient agrees to meet with Dr. Gwenlyn Saran and will call her office.  I spoke with Dr. Gwenlyn Saran and she is expecting his call.  He is willing to try melatonin for the sleep difficulty he associates with his anxiety   Sherene Sires, [place] - PGY2 09/17/2017 8:06 AM

## 2017-09-18 DIAGNOSIS — D509 Iron deficiency anemia, unspecified: Secondary | ICD-10-CM | POA: Diagnosis not present

## 2017-09-18 DIAGNOSIS — T80211A Bloodstream infection due to central venous catheter, initial encounter: Secondary | ICD-10-CM | POA: Diagnosis not present

## 2017-09-18 DIAGNOSIS — Z992 Dependence on renal dialysis: Secondary | ICD-10-CM | POA: Diagnosis not present

## 2017-09-18 DIAGNOSIS — N2581 Secondary hyperparathyroidism of renal origin: Secondary | ICD-10-CM | POA: Diagnosis not present

## 2017-09-18 DIAGNOSIS — J158 Pneumonia due to other specified bacteria: Secondary | ICD-10-CM | POA: Diagnosis not present

## 2017-09-18 DIAGNOSIS — N186 End stage renal disease: Secondary | ICD-10-CM | POA: Diagnosis not present

## 2017-09-21 DIAGNOSIS — N2581 Secondary hyperparathyroidism of renal origin: Secondary | ICD-10-CM | POA: Diagnosis not present

## 2017-09-21 DIAGNOSIS — T80211A Bloodstream infection due to central venous catheter, initial encounter: Secondary | ICD-10-CM | POA: Diagnosis not present

## 2017-09-21 DIAGNOSIS — J158 Pneumonia due to other specified bacteria: Secondary | ICD-10-CM | POA: Diagnosis not present

## 2017-09-21 DIAGNOSIS — N186 End stage renal disease: Secondary | ICD-10-CM | POA: Diagnosis not present

## 2017-09-21 DIAGNOSIS — Z992 Dependence on renal dialysis: Secondary | ICD-10-CM | POA: Diagnosis not present

## 2017-09-21 DIAGNOSIS — D509 Iron deficiency anemia, unspecified: Secondary | ICD-10-CM | POA: Diagnosis not present

## 2017-09-23 DIAGNOSIS — Z992 Dependence on renal dialysis: Secondary | ICD-10-CM | POA: Diagnosis not present

## 2017-09-23 DIAGNOSIS — J158 Pneumonia due to other specified bacteria: Secondary | ICD-10-CM | POA: Diagnosis not present

## 2017-09-23 DIAGNOSIS — N2581 Secondary hyperparathyroidism of renal origin: Secondary | ICD-10-CM | POA: Diagnosis not present

## 2017-09-23 DIAGNOSIS — N186 End stage renal disease: Secondary | ICD-10-CM | POA: Diagnosis not present

## 2017-09-23 DIAGNOSIS — D509 Iron deficiency anemia, unspecified: Secondary | ICD-10-CM | POA: Diagnosis not present

## 2017-09-23 DIAGNOSIS — T80211A Bloodstream infection due to central venous catheter, initial encounter: Secondary | ICD-10-CM | POA: Diagnosis not present

## 2017-09-24 ENCOUNTER — Ambulatory Visit (INDEPENDENT_AMBULATORY_CARE_PROVIDER_SITE_OTHER): Payer: Medicare Other | Admitting: Psychology

## 2017-09-24 DIAGNOSIS — F411 Generalized anxiety disorder: Secondary | ICD-10-CM

## 2017-09-24 DIAGNOSIS — F5109 Other insomnia not due to a substance or known physiological condition: Secondary | ICD-10-CM

## 2017-09-24 NOTE — Patient Instructions (Signed)
It was good to meet you today.    I will speak with Dr. Criss Rosales and hopefully come up with a plan that is both safe and helpful.

## 2017-09-24 NOTE — Progress Notes (Signed)
Dr. Criss Rosales requested a [place].   Presenting Issue:  Patient has taken Alprazolam in the past to help with sleep, especially prior to the days he has dialysis.  Dr. Criss Rosales was wanting further evaluation of mental health needs.  Report of symptoms:  Sleep seems to be the biggest issue.  He has Generalized Anxiety Disorder on his problem list but attempts to elicit symptoms related to this invariably led to talk about the role of his spirituality.  His self-report measures indicate some difficulty with anhedonia, mood, falling asleep, feeling tire, poor appetite, and worthlessness (score of 1 on each on PHQ-9).  Additionally, report of symptoms on GAD-7 indicate several days of feeling nervous, not being able to control worry, worrying about too many things, trouble relaxing, feeling afraid something awful might happen, and over half the days becoming easily annoyed and irritable.    Impact on function:  Says he has to motivate himself to do household chores.  His mom was a good motvator and after her death, he has been struggling a bit more.  Psychiatric History - Diagnoses: GAD on chart.  Denies other mental health diagnoses. - Hospitalizations: Denied. - Pharmacotherapy:  - Other than Alprazolam (last reported dose in February) and what sounds like an SSRI given by a prior     physician to replace the alprazolam.  He did not take it when he saw that it was for "depression."  - Has tried Tylenol PM for sleep (did not work).  -Melatonin was prescribed by Dr. Criss Rosales but it was not at the pharmacy per patient so he did not get it.   - Outpatient therapy: Denied.    Family history of psychiatric issues:  Wonders whether mother (who is really paternal grandmother) had Bipolar Disorder.  No formal diagnosis.  Did not know biological mother.  Reports no issues in biological father except substance abuse.    Current and history of substance use:  Some alcohol and marijuana use.  Reports  not using the latter now but considering starting again because it helps calm him down.    Medical conditions that might explain or contribute to symptoms:  Reviewed problem list and medications.  Dialysis can be anxiety provoking for him and he can feel "down" after (low energy).  PHQ-9:  6 (could not elicit a functional indicator from him) GAD-7:  6 (somewhat difficult)  Other:  Raised in foster care until around age 72 when grandmother (whom he calls mother) bumped into them in a grocery store and eventually adopted him, his brother, and then his two sisters.  She died two years ago.  Best friend with whom he lived with for several years died seven years ago.  Patient identifies as gay.  Has never had a romantic relationship but would like one.    Spirituality is very important to him.  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

## 2017-09-24 NOTE — Assessment & Plan Note (Signed)
Patient is neatly groomed and appropriately dressed.  He maintains good eye contact and is cooperative and attentive.  Speech is normal in tone, rate and rhythm.  Mood is euthymic with an appropriate affect.  Thought process is tangential.  Denied suicidal ideation.  Does not appear to be responding to any internal stimuli.  Reports hearing and seeing spirits in the context of having a gift of prophecy.  He loses track of the question almost every time and offers up considerable detail.  This appears to be a consultation to determine suitability for prescribing alprazolam for a specific situation (sleep prior to dialysis).  He has been off Alprazolam since February.  See insomnia problem for additional information.

## 2017-09-24 NOTE — Assessment & Plan Note (Signed)
Based on his report, I doubt an SSRI is a great match for his symptoms and I doubt he would be interested in taking it. - He was prescribed melatonin and he has yet to try it.  I printed off the report that it was ordered and asked him to call the pharmacy. - Also provided a sleep hygiene sheet although I do not think the patient is particularly motivated to make behavioral changes in this regard.   - Alprazolam tends not to be considered a sleep aid.  Intermediate or longer acting benzodiazepines can be but the longer ones can put older people at risk.  We discussed the potential risks.    Discussed with Pharmacy and will follow-up with Dr. Criss Rosales and one of [place] will call the patient with a recommendation.

## 2017-09-25 DIAGNOSIS — Z992 Dependence on renal dialysis: Secondary | ICD-10-CM | POA: Diagnosis not present

## 2017-09-25 DIAGNOSIS — N186 End stage renal disease: Secondary | ICD-10-CM | POA: Diagnosis not present

## 2017-09-25 DIAGNOSIS — J158 Pneumonia due to other specified bacteria: Secondary | ICD-10-CM | POA: Diagnosis not present

## 2017-09-25 DIAGNOSIS — D509 Iron deficiency anemia, unspecified: Secondary | ICD-10-CM | POA: Diagnosis not present

## 2017-09-25 DIAGNOSIS — T80211A Bloodstream infection due to central venous catheter, initial encounter: Secondary | ICD-10-CM | POA: Diagnosis not present

## 2017-09-25 DIAGNOSIS — N2581 Secondary hyperparathyroidism of renal origin: Secondary | ICD-10-CM | POA: Diagnosis not present

## 2017-09-28 DIAGNOSIS — Z992 Dependence on renal dialysis: Secondary | ICD-10-CM | POA: Diagnosis not present

## 2017-09-28 DIAGNOSIS — N2581 Secondary hyperparathyroidism of renal origin: Secondary | ICD-10-CM | POA: Diagnosis not present

## 2017-09-28 DIAGNOSIS — T80211A Bloodstream infection due to central venous catheter, initial encounter: Secondary | ICD-10-CM | POA: Diagnosis not present

## 2017-09-28 DIAGNOSIS — J158 Pneumonia due to other specified bacteria: Secondary | ICD-10-CM | POA: Diagnosis not present

## 2017-09-28 DIAGNOSIS — N186 End stage renal disease: Secondary | ICD-10-CM | POA: Diagnosis not present

## 2017-09-28 DIAGNOSIS — D509 Iron deficiency anemia, unspecified: Secondary | ICD-10-CM | POA: Diagnosis not present

## 2017-09-29 ENCOUNTER — Other Ambulatory Visit: Payer: Self-pay | Admitting: Family Medicine

## 2017-09-29 NOTE — Progress Notes (Signed)
After review with psychology and pharmacy for this patient's anxiety which is impacting his sleep, I will be prescribing ativan 0.5 mg daily.  There is an error with e-prescribing that I am trying to solve with clinic staff at this time.  -Dr. Criss Rosales

## 2017-09-30 ENCOUNTER — Other Ambulatory Visit: Payer: Self-pay | Admitting: Family Medicine

## 2017-09-30 DIAGNOSIS — F411 Generalized anxiety disorder: Secondary | ICD-10-CM

## 2017-09-30 DIAGNOSIS — J158 Pneumonia due to other specified bacteria: Secondary | ICD-10-CM | POA: Diagnosis not present

## 2017-09-30 DIAGNOSIS — D509 Iron deficiency anemia, unspecified: Secondary | ICD-10-CM | POA: Diagnosis not present

## 2017-09-30 DIAGNOSIS — Z992 Dependence on renal dialysis: Secondary | ICD-10-CM | POA: Diagnosis not present

## 2017-09-30 DIAGNOSIS — N2581 Secondary hyperparathyroidism of renal origin: Secondary | ICD-10-CM | POA: Diagnosis not present

## 2017-09-30 DIAGNOSIS — N186 End stage renal disease: Secondary | ICD-10-CM | POA: Diagnosis not present

## 2017-09-30 DIAGNOSIS — T80211A Bloodstream infection due to central venous catheter, initial encounter: Secondary | ICD-10-CM | POA: Diagnosis not present

## 2017-09-30 MED ORDER — LORAZEPAM 0.5 MG PO TABS: 0.5000 mg | ORAL_TABLET | Freq: Two times a day (BID) | ORAL | 1 refills | Status: DC | PRN

## 2017-09-30 NOTE — Progress Notes (Signed)
Ordering ativan after psych/pharm consult.  -Dr. Criss Rosales

## 2017-10-02 ENCOUNTER — Telehealth: Payer: Self-pay | Admitting: Psychology

## 2017-10-02 DIAGNOSIS — Z992 Dependence on renal dialysis: Secondary | ICD-10-CM | POA: Diagnosis not present

## 2017-10-02 DIAGNOSIS — N186 End stage renal disease: Secondary | ICD-10-CM | POA: Diagnosis not present

## 2017-10-02 DIAGNOSIS — N2581 Secondary hyperparathyroidism of renal origin: Secondary | ICD-10-CM | POA: Diagnosis not present

## 2017-10-02 DIAGNOSIS — T80211A Bloodstream infection due to central venous catheter, initial encounter: Secondary | ICD-10-CM | POA: Diagnosis not present

## 2017-10-02 DIAGNOSIS — J158 Pneumonia due to other specified bacteria: Secondary | ICD-10-CM | POA: Diagnosis not present

## 2017-10-02 DIAGNOSIS — D509 Iron deficiency anemia, unspecified: Secondary | ICD-10-CM | POA: Diagnosis not present

## 2017-10-02 NOTE — Telephone Encounter (Signed)
Called patient to communicate with him the decision Dr. Criss Rosales made to prescribe Ativan.  He expressed an understanding.  Offered to schedule a follow-up appointment with Dr. Criss Rosales or myself to check-in on how the medicine worked for him.  He said he would call to schedule if there were any difficulties.

## 2017-10-04 DIAGNOSIS — N186 End stage renal disease: Secondary | ICD-10-CM | POA: Diagnosis not present

## 2017-10-04 DIAGNOSIS — I12 Hypertensive chronic kidney disease with 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: Secondary | ICD-10-CM | POA: Diagnosis not present

## 2017-10-04 DIAGNOSIS — Z992 Dependence on renal dialysis: Secondary | ICD-10-CM | POA: Diagnosis not present

## 2017-10-05 DIAGNOSIS — Z992 Dependence on renal dialysis: Secondary | ICD-10-CM | POA: Diagnosis not present

## 2017-10-05 DIAGNOSIS — N186 End stage renal disease: Secondary | ICD-10-CM | POA: Diagnosis not present

## 2017-10-05 DIAGNOSIS — J158 Pneumonia due to other specified bacteria: Secondary | ICD-10-CM | POA: Diagnosis not present

## 2017-10-05 DIAGNOSIS — N2581 Secondary hyperparathyroidism of renal origin: Secondary | ICD-10-CM | POA: Diagnosis not present

## 2017-10-05 DIAGNOSIS — D631 Anemia in chronic kidney disease: Secondary | ICD-10-CM | POA: Diagnosis not present

## 2017-10-05 DIAGNOSIS — D509 Iron deficiency anemia, unspecified: Secondary | ICD-10-CM | POA: Diagnosis not present

## 2017-10-07 DIAGNOSIS — N2581 Secondary hyperparathyroidism of renal origin: Secondary | ICD-10-CM | POA: Diagnosis not present

## 2017-10-07 DIAGNOSIS — Z992 Dependence on renal dialysis: Secondary | ICD-10-CM | POA: Diagnosis not present

## 2017-10-07 DIAGNOSIS — D509 Iron deficiency anemia, unspecified: Secondary | ICD-10-CM | POA: Diagnosis not present

## 2017-10-07 DIAGNOSIS — J158 Pneumonia due to other specified bacteria: Secondary | ICD-10-CM | POA: Diagnosis not present

## 2017-10-07 DIAGNOSIS — N186 End stage renal disease: Secondary | ICD-10-CM | POA: Diagnosis not present

## 2017-10-07 DIAGNOSIS — D631 Anemia in chronic kidney disease: Secondary | ICD-10-CM | POA: Diagnosis not present

## 2017-10-09 DIAGNOSIS — Z992 Dependence on renal dialysis: Secondary | ICD-10-CM | POA: Diagnosis not present

## 2017-10-09 DIAGNOSIS — D631 Anemia in chronic kidney disease: Secondary | ICD-10-CM | POA: Diagnosis not present

## 2017-10-09 DIAGNOSIS — N2581 Secondary hyperparathyroidism of renal origin: Secondary | ICD-10-CM | POA: Diagnosis not present

## 2017-10-09 DIAGNOSIS — D509 Iron deficiency anemia, unspecified: Secondary | ICD-10-CM | POA: Diagnosis not present

## 2017-10-09 DIAGNOSIS — J158 Pneumonia due to other specified bacteria: Secondary | ICD-10-CM | POA: Diagnosis not present

## 2017-10-09 DIAGNOSIS — N186 End stage renal disease: Secondary | ICD-10-CM | POA: Diagnosis not present

## 2017-10-12 DIAGNOSIS — N2581 Secondary hyperparathyroidism of renal origin: Secondary | ICD-10-CM | POA: Diagnosis not present

## 2017-10-12 DIAGNOSIS — Z992 Dependence on renal dialysis: Secondary | ICD-10-CM | POA: Diagnosis not present

## 2017-10-12 DIAGNOSIS — N186 End stage renal disease: Secondary | ICD-10-CM | POA: Diagnosis not present

## 2017-10-12 DIAGNOSIS — J158 Pneumonia due to other specified bacteria: Secondary | ICD-10-CM | POA: Diagnosis not present

## 2017-10-12 DIAGNOSIS — D509 Iron deficiency anemia, unspecified: Secondary | ICD-10-CM | POA: Diagnosis not present

## 2017-10-12 DIAGNOSIS — D631 Anemia in chronic kidney disease: Secondary | ICD-10-CM | POA: Diagnosis not present

## 2017-10-14 ENCOUNTER — Telehealth: Payer: Self-pay

## 2017-10-14 DIAGNOSIS — N2581 Secondary hyperparathyroidism of renal origin: Secondary | ICD-10-CM | POA: Diagnosis not present

## 2017-10-14 DIAGNOSIS — Z992 Dependence on renal dialysis: Secondary | ICD-10-CM | POA: Diagnosis not present

## 2017-10-14 DIAGNOSIS — D509 Iron deficiency anemia, unspecified: Secondary | ICD-10-CM | POA: Diagnosis not present

## 2017-10-14 DIAGNOSIS — N186 End stage renal disease: Secondary | ICD-10-CM | POA: Diagnosis not present

## 2017-10-14 DIAGNOSIS — D631 Anemia in chronic kidney disease: Secondary | ICD-10-CM | POA: Diagnosis not present

## 2017-10-14 DIAGNOSIS — J158 Pneumonia due to other specified bacteria: Secondary | ICD-10-CM | POA: Diagnosis not present

## 2017-10-14 NOTE — Telephone Encounter (Signed)
Pt called nurse line requesting Dr. Criss Rosales. Pt stated he was giving an prescription for Ativan to help with sleeping. Pt picked this medication up last week and feels it is not helping at all. Please call him to discuss further or schedule for an apt.

## 2017-10-16 DIAGNOSIS — J158 Pneumonia due to other specified bacteria: Secondary | ICD-10-CM | POA: Diagnosis not present

## 2017-10-16 DIAGNOSIS — Z992 Dependence on renal dialysis: Secondary | ICD-10-CM | POA: Diagnosis not present

## 2017-10-16 DIAGNOSIS — N2581 Secondary hyperparathyroidism of renal origin: Secondary | ICD-10-CM | POA: Diagnosis not present

## 2017-10-16 DIAGNOSIS — D509 Iron deficiency anemia, unspecified: Secondary | ICD-10-CM | POA: Diagnosis not present

## 2017-10-16 DIAGNOSIS — D631 Anemia in chronic kidney disease: Secondary | ICD-10-CM | POA: Diagnosis not present

## 2017-10-16 DIAGNOSIS — N186 End stage renal disease: Secondary | ICD-10-CM | POA: Diagnosis not present

## 2017-10-19 DIAGNOSIS — N186 End stage renal disease: Secondary | ICD-10-CM | POA: Diagnosis not present

## 2017-10-19 DIAGNOSIS — D631 Anemia in chronic kidney disease: Secondary | ICD-10-CM | POA: Diagnosis not present

## 2017-10-19 DIAGNOSIS — Z992 Dependence on renal dialysis: Secondary | ICD-10-CM | POA: Diagnosis not present

## 2017-10-19 DIAGNOSIS — N2581 Secondary hyperparathyroidism of renal origin: Secondary | ICD-10-CM | POA: Diagnosis not present

## 2017-10-19 DIAGNOSIS — J158 Pneumonia due to other specified bacteria: Secondary | ICD-10-CM | POA: Diagnosis not present

## 2017-10-19 DIAGNOSIS — D509 Iron deficiency anemia, unspecified: Secondary | ICD-10-CM | POA: Diagnosis not present

## 2017-10-21 DIAGNOSIS — N2581 Secondary hyperparathyroidism of renal origin: Secondary | ICD-10-CM | POA: Diagnosis not present

## 2017-10-21 DIAGNOSIS — N186 End stage renal disease: Secondary | ICD-10-CM | POA: Diagnosis not present

## 2017-10-21 DIAGNOSIS — Z992 Dependence on renal dialysis: Secondary | ICD-10-CM | POA: Diagnosis not present

## 2017-10-21 DIAGNOSIS — D509 Iron deficiency anemia, unspecified: Secondary | ICD-10-CM | POA: Diagnosis not present

## 2017-10-21 DIAGNOSIS — J158 Pneumonia due to other specified bacteria: Secondary | ICD-10-CM | POA: Diagnosis not present

## 2017-10-21 DIAGNOSIS — D631 Anemia in chronic kidney disease: Secondary | ICD-10-CM | POA: Diagnosis not present

## 2017-10-21 NOTE — Telephone Encounter (Signed)
Pt calls back to check status.  Advised that per Dr. Criss Rosales, he would need an appt.   Pt is not happy with this and states "its very hard for me to get there, I have to take SCAT and I have been there to see Dr. Criss Rosales and Dr. Gwenlyn Saran. I just really need something to help me sleep.  I go 2 - 3 days with out a good nights sleep."  He is requesting that Dr. Criss Rosales call him. Fleeger, Salome Spotted, CMA

## 2017-10-22 NOTE — Telephone Encounter (Signed)
Contacted pt and gave him the below information and he said that he is not willing to do a sleep study because he has done 2-4 already. He said he was even given a CPAP machine for sleep apnea at one of his earlier studies.  He said that this is what he came in for last time and that he was sent to see psychiatrist and he had a wonderful experience with her and that Dr. Criss Rosales was supposed to get with her to determine,  thought he said "to start me on xanax again" so he doesn't feel like he needs to come back in for something that he has recently been seen for. I did tell him that Dr. Criss Rosales was probably still going to want him to come in and he wasn't happy about that. Katharina Caper, [place]tober Glazer D, [place]

## 2017-10-23 DIAGNOSIS — N2581 Secondary hyperparathyroidism of renal origin: Secondary | ICD-10-CM | POA: Diagnosis not present

## 2017-10-23 DIAGNOSIS — Z992 Dependence on renal dialysis: Secondary | ICD-10-CM | POA: Diagnosis not present

## 2017-10-23 DIAGNOSIS — N186 End stage renal disease: Secondary | ICD-10-CM | POA: Diagnosis not present

## 2017-10-23 DIAGNOSIS — J158 Pneumonia due to other specified bacteria: Secondary | ICD-10-CM | POA: Diagnosis not present

## 2017-10-23 DIAGNOSIS — D509 Iron deficiency anemia, unspecified: Secondary | ICD-10-CM | POA: Diagnosis not present

## 2017-10-23 DIAGNOSIS — D631 Anemia in chronic kidney disease: Secondary | ICD-10-CM | POA: Diagnosis not present

## 2017-10-26 DIAGNOSIS — J158 Pneumonia due to other specified bacteria: Secondary | ICD-10-CM | POA: Diagnosis not present

## 2017-10-26 DIAGNOSIS — D509 Iron deficiency anemia, unspecified: Secondary | ICD-10-CM | POA: Diagnosis not present

## 2017-10-26 DIAGNOSIS — N186 End stage renal disease: Secondary | ICD-10-CM | POA: Diagnosis not present

## 2017-10-26 DIAGNOSIS — D631 Anemia in chronic kidney disease: Secondary | ICD-10-CM | POA: Diagnosis not present

## 2017-10-26 DIAGNOSIS — Z992 Dependence on renal dialysis: Secondary | ICD-10-CM | POA: Diagnosis not present

## 2017-10-26 DIAGNOSIS — N2581 Secondary hyperparathyroidism of renal origin: Secondary | ICD-10-CM | POA: Diagnosis not present

## 2017-10-28 DIAGNOSIS — Z992 Dependence on renal dialysis: Secondary | ICD-10-CM | POA: Diagnosis not present

## 2017-10-28 DIAGNOSIS — N186 End stage renal disease: Secondary | ICD-10-CM | POA: Diagnosis not present

## 2017-10-28 DIAGNOSIS — D631 Anemia in chronic kidney disease: Secondary | ICD-10-CM | POA: Diagnosis not present

## 2017-10-28 DIAGNOSIS — N2581 Secondary hyperparathyroidism of renal origin: Secondary | ICD-10-CM | POA: Diagnosis not present

## 2017-10-28 DIAGNOSIS — J158 Pneumonia due to other specified bacteria: Secondary | ICD-10-CM | POA: Diagnosis not present

## 2017-10-28 DIAGNOSIS — D509 Iron deficiency anemia, unspecified: Secondary | ICD-10-CM | POA: Diagnosis not present

## 2017-10-30 DIAGNOSIS — N2581 Secondary hyperparathyroidism of renal origin: Secondary | ICD-10-CM | POA: Diagnosis not present

## 2017-10-30 DIAGNOSIS — D509 Iron deficiency anemia, unspecified: Secondary | ICD-10-CM | POA: Diagnosis not present

## 2017-10-30 DIAGNOSIS — Z992 Dependence on renal dialysis: Secondary | ICD-10-CM | POA: Diagnosis not present

## 2017-10-30 DIAGNOSIS — J158 Pneumonia due to other specified bacteria: Secondary | ICD-10-CM | POA: Diagnosis not present

## 2017-10-30 DIAGNOSIS — D631 Anemia in chronic kidney disease: Secondary | ICD-10-CM | POA: Diagnosis not present

## 2017-10-30 DIAGNOSIS — N186 End stage renal disease: Secondary | ICD-10-CM | POA: Diagnosis not present

## 2017-11-02 DIAGNOSIS — N2581 Secondary hyperparathyroidism of renal origin: Secondary | ICD-10-CM | POA: Diagnosis not present

## 2017-11-02 DIAGNOSIS — D631 Anemia in chronic kidney disease: Secondary | ICD-10-CM | POA: Diagnosis not present

## 2017-11-02 DIAGNOSIS — J158 Pneumonia due to other specified bacteria: Secondary | ICD-10-CM | POA: Diagnosis not present

## 2017-11-02 DIAGNOSIS — Z992 Dependence on renal dialysis: Secondary | ICD-10-CM | POA: Diagnosis not present

## 2017-11-02 DIAGNOSIS — N186 End stage renal disease: Secondary | ICD-10-CM | POA: Diagnosis not present

## 2017-11-02 DIAGNOSIS — D509 Iron deficiency anemia, unspecified: Secondary | ICD-10-CM | POA: Diagnosis not present

## 2017-11-03 DIAGNOSIS — I12 Hypertensive chronic kidney disease with 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: Secondary | ICD-10-CM | POA: Diagnosis not present

## 2017-11-03 DIAGNOSIS — Z992 Dependence on renal dialysis: Secondary | ICD-10-CM | POA: Diagnosis not present

## 2017-11-03 DIAGNOSIS — N186 End stage renal disease: Secondary | ICD-10-CM | POA: Diagnosis not present

## 2017-11-04 DIAGNOSIS — N2581 Secondary hyperparathyroidism of renal origin: Secondary | ICD-10-CM | POA: Diagnosis not present

## 2017-11-04 DIAGNOSIS — J158 Pneumonia due to other specified bacteria: Secondary | ICD-10-CM | POA: Diagnosis not present

## 2017-11-04 DIAGNOSIS — D631 Anemia in chronic kidney disease: Secondary | ICD-10-CM | POA: Diagnosis not present

## 2017-11-04 DIAGNOSIS — Z992 Dependence on renal dialysis: Secondary | ICD-10-CM | POA: Diagnosis not present

## 2017-11-04 DIAGNOSIS — D509 Iron deficiency anemia, unspecified: Secondary | ICD-10-CM | POA: Diagnosis not present

## 2017-11-04 DIAGNOSIS — N186 End stage renal disease: Secondary | ICD-10-CM | POA: Diagnosis not present

## 2017-11-05 ENCOUNTER — Telehealth: Payer: Self-pay

## 2017-11-05 DIAGNOSIS — L219 Seborrheic dermatitis, unspecified: Secondary | ICD-10-CM | POA: Diagnosis not present

## 2017-11-05 DIAGNOSIS — Z23 Encounter for immunization: Secondary | ICD-10-CM | POA: Diagnosis not present

## 2017-11-05 NOTE — Telephone Encounter (Signed)
Pt calling again for medication to help with sleep the night before dialysis. Pt said she has spoken with Dr. Criss Rosales and Dr. Gwenlyn Saran and the problem is anxiety keep him awake. The medication he was given a month ago isn't working. Doesn't understand why he is needing to come back into the office when he has seen 2 Dr's for the same thing. Pt would like to have Dr. Criss Rosales call him. He is getting very frustrated. Ottis Stain, CMA

## 2017-11-08 ENCOUNTER — Other Ambulatory Visit: Payer: Self-pay | Admitting: Family Medicine

## 2017-11-08 DIAGNOSIS — F409 Phobic anxiety disorder, unspecified: Secondary | ICD-10-CM

## 2017-11-08 DIAGNOSIS — F5105 Insomnia due to other mental disorder: Principal | ICD-10-CM

## 2017-11-08 NOTE — Progress Notes (Signed)
Patient complaining of insomnia refractory to ativan which was chosen after consultation with psychology and pharmacy.  Patient calling asking for additional/different benzo.  Will refer to sleep medicine for insomnia.  -Dr. Criss Rosales

## 2017-11-09 DIAGNOSIS — N186 End stage renal disease: Secondary | ICD-10-CM | POA: Diagnosis not present

## 2017-11-09 DIAGNOSIS — D509 Iron deficiency anemia, unspecified: Secondary | ICD-10-CM | POA: Diagnosis not present

## 2017-11-09 DIAGNOSIS — Z992 Dependence on renal dialysis: Secondary | ICD-10-CM | POA: Diagnosis not present

## 2017-11-09 DIAGNOSIS — J158 Pneumonia due to other specified bacteria: Secondary | ICD-10-CM | POA: Diagnosis not present

## 2017-11-09 DIAGNOSIS — D631 Anemia in chronic kidney disease: Secondary | ICD-10-CM | POA: Diagnosis not present

## 2017-11-09 DIAGNOSIS — N2581 Secondary hyperparathyroidism of renal origin: Secondary | ICD-10-CM | POA: Diagnosis not present

## 2017-11-11 DIAGNOSIS — N186 End stage renal disease: Secondary | ICD-10-CM | POA: Diagnosis not present

## 2017-11-11 DIAGNOSIS — D509 Iron deficiency anemia, unspecified: Secondary | ICD-10-CM | POA: Diagnosis not present

## 2017-11-11 DIAGNOSIS — J158 Pneumonia due to other specified bacteria: Secondary | ICD-10-CM | POA: Diagnosis not present

## 2017-11-11 DIAGNOSIS — N2581 Secondary hyperparathyroidism of renal origin: Secondary | ICD-10-CM | POA: Diagnosis not present

## 2017-11-11 DIAGNOSIS — D631 Anemia in chronic kidney disease: Secondary | ICD-10-CM | POA: Diagnosis not present

## 2017-11-11 DIAGNOSIS — Z992 Dependence on renal dialysis: Secondary | ICD-10-CM | POA: Diagnosis not present

## 2017-11-12 NOTE — Telephone Encounter (Addendum)
LVM to call office back to inform pt of below. Katharina Caper, April D, CMA    Sherene Sires, DO  [facility] 4 days ago    Please notify patient I am on nights all week in the hospital. "Sir, your medication was chosen after discussion between myself, pharmacy and Dr. Gwenlyn Saran and that is the medicine I am prepared to prescribe for sleep. I have placed a referral to sleep medicine for insomnia (not a cpap study) if the ativan is insufficient."   Routing comment

## 2017-11-13 DIAGNOSIS — D631 Anemia in chronic kidney disease: Secondary | ICD-10-CM | POA: Diagnosis not present

## 2017-11-13 DIAGNOSIS — D509 Iron deficiency anemia, unspecified: Secondary | ICD-10-CM | POA: Diagnosis not present

## 2017-11-13 DIAGNOSIS — Z992 Dependence on renal dialysis: Secondary | ICD-10-CM | POA: Diagnosis not present

## 2017-11-13 DIAGNOSIS — J158 Pneumonia due to other specified bacteria: Secondary | ICD-10-CM | POA: Diagnosis not present

## 2017-11-13 DIAGNOSIS — N186 End stage renal disease: Secondary | ICD-10-CM | POA: Diagnosis not present

## 2017-11-13 DIAGNOSIS — N2581 Secondary hyperparathyroidism of renal origin: Secondary | ICD-10-CM | POA: Diagnosis not present

## 2017-11-16 DIAGNOSIS — Z992 Dependence on renal dialysis: Secondary | ICD-10-CM | POA: Diagnosis not present

## 2017-11-16 DIAGNOSIS — N2581 Secondary hyperparathyroidism of renal origin: Secondary | ICD-10-CM | POA: Diagnosis not present

## 2017-11-16 DIAGNOSIS — J158 Pneumonia due to other specified bacteria: Secondary | ICD-10-CM | POA: Diagnosis not present

## 2017-11-16 DIAGNOSIS — N186 End stage renal disease: Secondary | ICD-10-CM | POA: Diagnosis not present

## 2017-11-16 DIAGNOSIS — D631 Anemia in chronic kidney disease: Secondary | ICD-10-CM | POA: Diagnosis not present

## 2017-11-16 DIAGNOSIS — D509 Iron deficiency anemia, unspecified: Secondary | ICD-10-CM | POA: Diagnosis not present

## 2017-11-18 DIAGNOSIS — J158 Pneumonia due to other specified bacteria: Secondary | ICD-10-CM | POA: Diagnosis not present

## 2017-11-18 DIAGNOSIS — D509 Iron deficiency anemia, unspecified: Secondary | ICD-10-CM | POA: Diagnosis not present

## 2017-11-18 DIAGNOSIS — Z992 Dependence on renal dialysis: Secondary | ICD-10-CM | POA: Diagnosis not present

## 2017-11-18 DIAGNOSIS — N2581 Secondary hyperparathyroidism of renal origin: Secondary | ICD-10-CM | POA: Diagnosis not present

## 2017-11-18 DIAGNOSIS — N186 End stage renal disease: Secondary | ICD-10-CM | POA: Diagnosis not present

## 2017-11-18 DIAGNOSIS — D631 Anemia in chronic kidney disease: Secondary | ICD-10-CM | POA: Diagnosis not present

## 2017-11-20 DIAGNOSIS — N186 End stage renal disease: Secondary | ICD-10-CM | POA: Diagnosis not present

## 2017-11-20 DIAGNOSIS — Z992 Dependence on renal dialysis: Secondary | ICD-10-CM | POA: Diagnosis not present

## 2017-11-20 DIAGNOSIS — D509 Iron deficiency anemia, unspecified: Secondary | ICD-10-CM | POA: Diagnosis not present

## 2017-11-20 DIAGNOSIS — N2581 Secondary hyperparathyroidism of renal origin: Secondary | ICD-10-CM | POA: Diagnosis not present

## 2017-11-20 DIAGNOSIS — D631 Anemia in chronic kidney disease: Secondary | ICD-10-CM | POA: Diagnosis not present

## 2017-11-20 DIAGNOSIS — J158 Pneumonia due to other specified bacteria: Secondary | ICD-10-CM | POA: Diagnosis not present

## 2017-11-23 DIAGNOSIS — D509 Iron deficiency anemia, unspecified: Secondary | ICD-10-CM | POA: Diagnosis not present

## 2017-11-23 DIAGNOSIS — Z992 Dependence on renal dialysis: Secondary | ICD-10-CM | POA: Diagnosis not present

## 2017-11-23 DIAGNOSIS — N186 End stage renal disease: Secondary | ICD-10-CM | POA: Diagnosis not present

## 2017-11-23 DIAGNOSIS — J158 Pneumonia due to other specified bacteria: Secondary | ICD-10-CM | POA: Diagnosis not present

## 2017-11-23 DIAGNOSIS — N2581 Secondary hyperparathyroidism of renal origin: Secondary | ICD-10-CM | POA: Diagnosis not present

## 2017-11-23 DIAGNOSIS — D631 Anemia in chronic kidney disease: Secondary | ICD-10-CM | POA: Diagnosis not present

## 2017-11-24 NOTE — Telephone Encounter (Signed)
Routing to PCP to be sure that he received the in basket message about the referral that was placed for pt to see what next steps may be. Katharina Caper, April D, [place]

## 2017-11-25 DIAGNOSIS — Z992 Dependence on renal dialysis: Secondary | ICD-10-CM | POA: Diagnosis not present

## 2017-11-25 DIAGNOSIS — D509 Iron deficiency anemia, unspecified: Secondary | ICD-10-CM | POA: Diagnosis not present

## 2017-11-25 DIAGNOSIS — J158 Pneumonia due to other specified bacteria: Secondary | ICD-10-CM | POA: Diagnosis not present

## 2017-11-25 DIAGNOSIS — D631 Anemia in chronic kidney disease: Secondary | ICD-10-CM | POA: Diagnosis not present

## 2017-11-25 DIAGNOSIS — N186 End stage renal disease: Secondary | ICD-10-CM | POA: Diagnosis not present

## 2017-11-25 DIAGNOSIS — N2581 Secondary hyperparathyroidism of renal origin: Secondary | ICD-10-CM | POA: Diagnosis not present

## 2017-11-26 ENCOUNTER — Telehealth: Payer: Self-pay

## 2017-11-26 DIAGNOSIS — Z299 Encounter for prophylactic measures, unspecified: Secondary | ICD-10-CM

## 2017-11-26 MED ORDER — AMOXICILLIN 500 MG PO CAPS: 2000.0000 mg | ORAL_CAPSULE | Freq: Once | ORAL | 0 refills | Status: DC

## 2017-11-26 NOTE — Telephone Encounter (Signed)
Pt informed. Pt has good understanding and is waiting to hear from dentist for date of procedure. Ottis Stain, CMA

## 2017-11-26 NOTE — Telephone Encounter (Signed)
Per AHA guidelines, 2G amoxacillin for ESRD patient 1 hour prior to procedure.  -Dr. Criss Rosales

## 2017-11-26 NOTE — Telephone Encounter (Signed)
Allie at [facility] called to ask if abx prophylaxis required due to ESRD on dialysis. Patient needs cleaning and possible extraction. Spoke to preceptor, Dr Mingo Amber, who reviewed chart and said yes. Allie informed. Note to PCP to prescribe.  Danley Danker, RN [facility] RN)

## 2017-11-27 DIAGNOSIS — D509 Iron deficiency anemia, unspecified: Secondary | ICD-10-CM | POA: Diagnosis not present

## 2017-11-27 DIAGNOSIS — J158 Pneumonia due to other specified bacteria: Secondary | ICD-10-CM | POA: Diagnosis not present

## 2017-11-27 DIAGNOSIS — N2581 Secondary hyperparathyroidism of renal origin: Secondary | ICD-10-CM | POA: Diagnosis not present

## 2017-11-27 DIAGNOSIS — Z992 Dependence on renal dialysis: Secondary | ICD-10-CM | POA: Diagnosis not present

## 2017-11-27 DIAGNOSIS — N186 End stage renal disease: Secondary | ICD-10-CM | POA: Diagnosis not present

## 2017-11-27 DIAGNOSIS — D631 Anemia in chronic kidney disease: Secondary | ICD-10-CM | POA: Diagnosis not present

## 2017-11-30 DIAGNOSIS — D631 Anemia in chronic kidney disease: Secondary | ICD-10-CM | POA: Diagnosis not present

## 2017-11-30 DIAGNOSIS — N2581 Secondary hyperparathyroidism of renal origin: Secondary | ICD-10-CM | POA: Diagnosis not present

## 2017-11-30 DIAGNOSIS — J158 Pneumonia due to other specified bacteria: Secondary | ICD-10-CM | POA: Diagnosis not present

## 2017-11-30 DIAGNOSIS — N186 End stage renal disease: Secondary | ICD-10-CM | POA: Diagnosis not present

## 2017-11-30 DIAGNOSIS — D509 Iron deficiency anemia, unspecified: Secondary | ICD-10-CM | POA: Diagnosis not present

## 2017-11-30 DIAGNOSIS — Z992 Dependence on renal dialysis: Secondary | ICD-10-CM | POA: Diagnosis not present

## 2017-12-02 DIAGNOSIS — D631 Anemia in chronic kidney disease: Secondary | ICD-10-CM | POA: Diagnosis not present

## 2017-12-02 DIAGNOSIS — N2581 Secondary hyperparathyroidism of renal origin: Secondary | ICD-10-CM | POA: Diagnosis not present

## 2017-12-02 DIAGNOSIS — Z992 Dependence on renal dialysis: Secondary | ICD-10-CM | POA: Diagnosis not present

## 2017-12-02 DIAGNOSIS — N186 End stage renal disease: Secondary | ICD-10-CM | POA: Diagnosis not present

## 2017-12-02 DIAGNOSIS — J158 Pneumonia due to other specified bacteria: Secondary | ICD-10-CM | POA: Diagnosis not present

## 2017-12-02 DIAGNOSIS — D509 Iron deficiency anemia, unspecified: Secondary | ICD-10-CM | POA: Diagnosis not present

## 2017-12-04 DIAGNOSIS — N2581 Secondary hyperparathyroidism of renal origin: Secondary | ICD-10-CM | POA: Diagnosis not present

## 2017-12-04 DIAGNOSIS — I12 Hypertensive chronic kidney disease with 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: Secondary | ICD-10-CM | POA: Diagnosis not present

## 2017-12-04 DIAGNOSIS — Z992 Dependence on renal dialysis: Secondary | ICD-10-CM | POA: Diagnosis not present

## 2017-12-04 DIAGNOSIS — D509 Iron deficiency anemia, unspecified: Secondary | ICD-10-CM | POA: Diagnosis not present

## 2017-12-04 DIAGNOSIS — D631 Anemia in chronic kidney disease: Secondary | ICD-10-CM | POA: Diagnosis not present

## 2017-12-04 DIAGNOSIS — J158 Pneumonia due to other specified bacteria: Secondary | ICD-10-CM | POA: Diagnosis not present

## 2017-12-04 DIAGNOSIS — N186 End stage renal disease: Secondary | ICD-10-CM | POA: Diagnosis not present

## 2017-12-07 DIAGNOSIS — J158 Pneumonia due to other specified bacteria: Secondary | ICD-10-CM | POA: Diagnosis not present

## 2017-12-07 DIAGNOSIS — D631 Anemia in chronic kidney disease: Secondary | ICD-10-CM | POA: Diagnosis not present

## 2017-12-07 DIAGNOSIS — N2581 Secondary hyperparathyroidism of renal origin: Secondary | ICD-10-CM | POA: Diagnosis not present

## 2017-12-07 DIAGNOSIS — D509 Iron deficiency anemia, unspecified: Secondary | ICD-10-CM | POA: Diagnosis not present

## 2017-12-07 DIAGNOSIS — Z992 Dependence on renal dialysis: Secondary | ICD-10-CM | POA: Diagnosis not present

## 2017-12-07 DIAGNOSIS — N186 End stage renal disease: Secondary | ICD-10-CM | POA: Diagnosis not present

## 2017-12-09 DIAGNOSIS — N2581 Secondary hyperparathyroidism of renal origin: Secondary | ICD-10-CM | POA: Diagnosis not present

## 2017-12-09 DIAGNOSIS — D631 Anemia in chronic kidney disease: Secondary | ICD-10-CM | POA: Diagnosis not present

## 2017-12-09 DIAGNOSIS — Z992 Dependence on renal dialysis: Secondary | ICD-10-CM | POA: Diagnosis not present

## 2017-12-09 DIAGNOSIS — D509 Iron deficiency anemia, unspecified: Secondary | ICD-10-CM | POA: Diagnosis not present

## 2017-12-09 DIAGNOSIS — N186 End stage renal disease: Secondary | ICD-10-CM | POA: Diagnosis not present

## 2017-12-09 DIAGNOSIS — J158 Pneumonia due to other specified bacteria: Secondary | ICD-10-CM | POA: Diagnosis not present

## 2017-12-10 ENCOUNTER — Other Ambulatory Visit: Payer: Self-pay | Admitting: Family Medicine

## 2017-12-11 DIAGNOSIS — J158 Pneumonia due to other specified bacteria: Secondary | ICD-10-CM | POA: Diagnosis not present

## 2017-12-11 DIAGNOSIS — D631 Anemia in chronic kidney disease: Secondary | ICD-10-CM | POA: Diagnosis not present

## 2017-12-11 DIAGNOSIS — Z992 Dependence on renal dialysis: Secondary | ICD-10-CM | POA: Diagnosis not present

## 2017-12-11 DIAGNOSIS — N2581 Secondary hyperparathyroidism of renal origin: Secondary | ICD-10-CM | POA: Diagnosis not present

## 2017-12-11 DIAGNOSIS — D509 Iron deficiency anemia, unspecified: Secondary | ICD-10-CM | POA: Diagnosis not present

## 2017-12-11 DIAGNOSIS — N186 End stage renal disease: Secondary | ICD-10-CM | POA: Diagnosis not present

## 2017-12-14 DIAGNOSIS — D509 Iron deficiency anemia, unspecified: Secondary | ICD-10-CM | POA: Diagnosis not present

## 2017-12-14 DIAGNOSIS — J158 Pneumonia due to other specified bacteria: Secondary | ICD-10-CM | POA: Diagnosis not present

## 2017-12-14 DIAGNOSIS — D631 Anemia in chronic kidney disease: Secondary | ICD-10-CM | POA: Diagnosis not present

## 2017-12-14 DIAGNOSIS — N2581 Secondary hyperparathyroidism of renal origin: Secondary | ICD-10-CM | POA: Diagnosis not present

## 2017-12-14 DIAGNOSIS — Z992 Dependence on renal dialysis: Secondary | ICD-10-CM | POA: Diagnosis not present

## 2017-12-14 DIAGNOSIS — N186 End stage renal disease: Secondary | ICD-10-CM | POA: Diagnosis not present

## 2017-12-16 DIAGNOSIS — N2581 Secondary hyperparathyroidism of renal origin: Secondary | ICD-10-CM | POA: Diagnosis not present

## 2017-12-16 DIAGNOSIS — J158 Pneumonia due to other specified bacteria: Secondary | ICD-10-CM | POA: Diagnosis not present

## 2017-12-16 DIAGNOSIS — Z992 Dependence on renal dialysis: Secondary | ICD-10-CM | POA: Diagnosis not present

## 2017-12-16 DIAGNOSIS — D509 Iron deficiency anemia, unspecified: Secondary | ICD-10-CM | POA: Diagnosis not present

## 2017-12-16 DIAGNOSIS — D631 Anemia in chronic kidney disease: Secondary | ICD-10-CM | POA: Diagnosis not present

## 2017-12-16 DIAGNOSIS — N186 End stage renal disease: Secondary | ICD-10-CM | POA: Diagnosis not present

## 2017-12-18 DIAGNOSIS — N186 End stage renal disease: Secondary | ICD-10-CM | POA: Diagnosis not present

## 2017-12-18 DIAGNOSIS — N2581 Secondary hyperparathyroidism of renal origin: Secondary | ICD-10-CM | POA: Diagnosis not present

## 2017-12-18 DIAGNOSIS — D631 Anemia in chronic kidney disease: Secondary | ICD-10-CM | POA: Diagnosis not present

## 2017-12-18 DIAGNOSIS — J158 Pneumonia due to other specified bacteria: Secondary | ICD-10-CM | POA: Diagnosis not present

## 2017-12-18 DIAGNOSIS — D509 Iron deficiency anemia, unspecified: Secondary | ICD-10-CM | POA: Diagnosis not present

## 2017-12-18 DIAGNOSIS — Z992 Dependence on renal dialysis: Secondary | ICD-10-CM | POA: Diagnosis not present

## 2017-12-21 DIAGNOSIS — J158 Pneumonia due to other specified bacteria: Secondary | ICD-10-CM | POA: Diagnosis not present

## 2017-12-21 DIAGNOSIS — D631 Anemia in chronic kidney disease: Secondary | ICD-10-CM | POA: Diagnosis not present

## 2017-12-21 DIAGNOSIS — Z992 Dependence on renal dialysis: Secondary | ICD-10-CM | POA: Diagnosis not present

## 2017-12-21 DIAGNOSIS — N2581 Secondary hyperparathyroidism of renal origin: Secondary | ICD-10-CM | POA: Diagnosis not present

## 2017-12-21 DIAGNOSIS — D509 Iron deficiency anemia, unspecified: Secondary | ICD-10-CM | POA: Diagnosis not present

## 2017-12-21 DIAGNOSIS — N186 End stage renal disease: Secondary | ICD-10-CM | POA: Diagnosis not present

## 2017-12-22 ENCOUNTER — Inpatient Hospital Stay (HOSPITAL_COMMUNITY)
Admission: EM | Admit: 2017-12-22 | Discharge: 2017-12-25 | DRG: 291 | Discharge status: Against Medical Advice | Payer: Medicare Other | Attending: Pulmonary Disease | Admitting: Pulmonary Disease

## 2017-12-22 ENCOUNTER — Emergency Department (HOSPITAL_COMMUNITY): Payer: Medicare Other

## 2017-12-22 ENCOUNTER — Other Ambulatory Visit: Payer: Self-pay

## 2017-12-22 DIAGNOSIS — Z79899 Other long term (current) drug therapy: Secondary | ICD-10-CM

## 2017-12-22 DIAGNOSIS — N2581 Secondary hyperparathyroidism of renal origin: Secondary | ICD-10-CM | POA: Diagnosis present

## 2017-12-22 DIAGNOSIS — R109 Unspecified abdominal pain: Secondary | ICD-10-CM | POA: Diagnosis not present

## 2017-12-22 DIAGNOSIS — I161 Hypertensive emergency: Secondary | ICD-10-CM | POA: Diagnosis not present

## 2017-12-22 DIAGNOSIS — K7460 Unspecified cirrhosis of liver: Secondary | ICD-10-CM | POA: Diagnosis present

## 2017-12-22 DIAGNOSIS — I5042 Chronic combined systolic (congestive) and diastolic (congestive) heart failure: Secondary | ICD-10-CM | POA: Diagnosis not present

## 2017-12-22 DIAGNOSIS — R0602 Shortness of breath: Secondary | ICD-10-CM | POA: Diagnosis not present

## 2017-12-22 DIAGNOSIS — E8889 Other specified metabolic disorders: Secondary | ICD-10-CM | POA: Diagnosis present

## 2017-12-22 DIAGNOSIS — Z87891 Personal history of nicotine dependence: Secondary | ICD-10-CM | POA: Diagnosis not present

## 2017-12-22 DIAGNOSIS — N400 Enlarged prostate without lower urinary tract symptoms: Secondary | ICD-10-CM | POA: Diagnosis present

## 2017-12-22 DIAGNOSIS — J9601 Acute respiratory failure with hypoxia: Secondary | ICD-10-CM | POA: Diagnosis not present

## 2017-12-22 DIAGNOSIS — I132 Hypertensive heart and chronic kidney disease with heart failure and with stage 5 chronic kidney disease, or end stage renal disease: Secondary | ICD-10-CM | POA: Diagnosis not present

## 2017-12-22 DIAGNOSIS — D638 Anemia in other chronic diseases classified elsewhere: Secondary | ICD-10-CM | POA: Diagnosis present

## 2017-12-22 DIAGNOSIS — I12 Hypertensive chronic kidney disease with 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: Secondary | ICD-10-CM | POA: Diagnosis not present

## 2017-12-22 DIAGNOSIS — Z825 Family history of asthma and other chronic lower respiratory diseases: Secondary | ICD-10-CM

## 2017-12-22 DIAGNOSIS — F411 Generalized anxiety disorder: Secondary | ICD-10-CM | POA: Diagnosis present

## 2017-12-22 DIAGNOSIS — Z9114 Patient's other noncompliance with medication regimen: Secondary | ICD-10-CM

## 2017-12-22 DIAGNOSIS — I700 Atherosclerosis of aorta: Secondary | ICD-10-CM | POA: Diagnosis present

## 2017-12-22 DIAGNOSIS — N186 End stage renal disease: Secondary | ICD-10-CM | POA: Diagnosis not present

## 2017-12-22 DIAGNOSIS — R1010 Upper abdominal pain, unspecified: Secondary | ICD-10-CM | POA: Diagnosis not present

## 2017-12-22 DIAGNOSIS — R001 Bradycardia, unspecified: Secondary | ICD-10-CM | POA: Diagnosis not present

## 2017-12-22 DIAGNOSIS — D631 Anemia in chronic kidney disease: Secondary | ICD-10-CM | POA: Diagnosis not present

## 2017-12-22 DIAGNOSIS — Z992 Dependence on renal dialysis: Secondary | ICD-10-CM

## 2017-12-22 DIAGNOSIS — Z886 Allergy status to analgesic agent status: Secondary | ICD-10-CM

## 2017-12-22 DIAGNOSIS — R531 Weakness: Secondary | ICD-10-CM | POA: Diagnosis not present

## 2017-12-22 DIAGNOSIS — J449 Chronic obstructive pulmonary disease, unspecified: Secondary | ICD-10-CM | POA: Diagnosis not present

## 2017-12-22 DIAGNOSIS — I2510 Atherosclerotic heart disease of native coronary artery without angina pectoris: Secondary | ICD-10-CM | POA: Diagnosis present

## 2017-12-22 DIAGNOSIS — Z9115 Patient's noncompliance with renal dialysis: Secondary | ICD-10-CM

## 2017-12-22 DIAGNOSIS — I272 Pulmonary hypertension, unspecified: Secondary | ICD-10-CM | POA: Diagnosis present

## 2017-12-22 DIAGNOSIS — J811 Chronic pulmonary edema: Secondary | ICD-10-CM | POA: Diagnosis not present

## 2017-12-22 DIAGNOSIS — I340 Nonrheumatic mitral (valve) insufficiency: Secondary | ICD-10-CM | POA: Diagnosis not present

## 2017-12-22 DIAGNOSIS — Z823 Family history of stroke: Secondary | ICD-10-CM

## 2017-12-22 DIAGNOSIS — K219 Gastro-esophageal reflux disease without esophagitis: Secondary | ICD-10-CM | POA: Diagnosis present

## 2017-12-22 DIAGNOSIS — I10 Essential (primary) hypertension: Secondary | ICD-10-CM | POA: Diagnosis present

## 2017-12-22 DIAGNOSIS — J810 Acute pulmonary edema: Secondary | ICD-10-CM

## 2017-12-22 DIAGNOSIS — Z9119 Patient's noncompliance with other medical treatment and regimen: Secondary | ICD-10-CM

## 2017-12-22 DIAGNOSIS — G4733 Obstructive sleep apnea (adult) (pediatric): Secondary | ICD-10-CM | POA: Diagnosis present

## 2017-12-22 DIAGNOSIS — R918 Other nonspecific abnormal finding of lung field: Secondary | ICD-10-CM | POA: Diagnosis not present

## 2017-12-22 DIAGNOSIS — I440 Atrioventricular block, first degree: Secondary | ICD-10-CM | POA: Diagnosis present

## 2017-12-22 DIAGNOSIS — I160 Hypertensive urgency: Secondary | ICD-10-CM | POA: Diagnosis not present

## 2017-12-22 DIAGNOSIS — Z8249 Family history of ischemic heart disease and other diseases of the circulatory system: Secondary | ICD-10-CM | POA: Diagnosis not present

## 2017-12-22 DIAGNOSIS — F329 Major depressive disorder, single episode, unspecified: Secondary | ICD-10-CM | POA: Diagnosis not present

## 2017-12-22 DIAGNOSIS — I509 Heart failure, unspecified: Secondary | ICD-10-CM | POA: Diagnosis not present

## 2017-12-22 DIAGNOSIS — I429 Cardiomyopathy, unspecified: Secondary | ICD-10-CM | POA: Diagnosis present

## 2017-12-22 DIAGNOSIS — E8770 Fluid overload, unspecified: Secondary | ICD-10-CM | POA: Diagnosis not present

## 2017-12-22 DIAGNOSIS — I361 Nonrheumatic tricuspid (valve) insufficiency: Secondary | ICD-10-CM | POA: Diagnosis not present

## 2017-12-22 LAB — CBC WITH DIFFERENTIAL/PLATELET
ABS IMMATURE GRANULOCYTES: 0 10*3/uL (ref 0.00–0.07)
BASOS ABS: 0 10*3/uL (ref 0.0–0.1)
Basophils Relative: 0 %
EOS PCT: 2 %
Eosinophils Absolute: 0.1 10*3/uL (ref 0.0–0.5)
HCT: 29.5 % — ABNORMAL LOW (ref 39.0–52.0)
HEMOGLOBIN: 8.9 g/dL — AB (ref 13.0–17.0)
LYMPHS ABS: 1.2 10*3/uL (ref 0.7–4.0)
LYMPHS PCT: 20 %
MCH: 27.5 pg (ref 26.0–34.0)
MCHC: 30.2 g/dL (ref 30.0–36.0)
MCV: 91 fL (ref 80.0–100.0)
Monocytes Absolute: 0.4 10*3/uL (ref 0.1–1.0)
Monocytes Relative: 6 %
NEUTROS PCT: 72 %
NRBC: 0 % (ref 0.0–0.2)
NRBC: 0 /100{WBCs}
Neutro Abs: 4.2 10*3/uL (ref 1.7–7.7)
Platelets: 171 10*3/uL (ref 150–400)
RBC: 3.24 MIL/uL — ABNORMAL LOW (ref 4.22–5.81)
RDW: 21.1 % — ABNORMAL HIGH (ref 11.5–15.5)
WBC: 5.9 10*3/uL (ref 4.0–10.5)

## 2017-12-22 LAB — COMPREHENSIVE METABOLIC PANEL
ALBUMIN: 3.5 g/dL (ref 3.5–5.0)
ALT: 12 U/L (ref 0–44)
AST: 23 U/L (ref 15–41)
Alkaline Phosphatase: 40 U/L (ref 38–126)
Anion gap: 16 — ABNORMAL HIGH (ref 5–15)
BUN: 18 mg/dL (ref 6–20)
CHLORIDE: 94 mmol/L — AB (ref 98–111)
CO2: 31 mmol/L (ref 22–32)
Calcium: 10.1 mg/dL (ref 8.9–10.3)
Creatinine, Ser: 10.52 mg/dL — ABNORMAL HIGH (ref 0.61–1.24)
GFR calc Af Amer: 6 mL/min — ABNORMAL LOW (ref >60)
GFR calc non Af Amer: 5 mL/min — ABNORMAL LOW (ref >60)
GLUCOSE: 89 mg/dL (ref 70–99)
POTASSIUM: 3.9 mmol/L (ref 3.5–5.1)
Sodium: 141 mmol/L (ref 135–145)
TOTAL PROTEIN: 7.2 g/dL (ref 6.5–8.1)
Total Bilirubin: 1.2 mg/dL (ref 0.3–1.2)

## 2017-12-22 LAB — CBG MONITORING, ED: Glucose-Capillary: 86 mg/dL (ref 70–99)

## 2017-12-22 LAB — I-STAT CHEM 8, ED
BUN: 20 mg/dL (ref 6–20)
Calcium, Ion: 1.11 mmol/L — ABNORMAL LOW (ref 1.15–1.40)
Chloride: 91 mmol/L — ABNORMAL LOW (ref 98–111)
Creatinine, Ser: 10.5 mg/dL — ABNORMAL HIGH (ref 0.61–1.24)
Glucose, Bld: 91 mg/dL (ref 70–99)
HEMATOCRIT: 30 % — AB (ref 39.0–52.0)
Hemoglobin: 10.2 g/dL — ABNORMAL LOW (ref 13.0–17.0)
POTASSIUM: 3.6 mmol/L (ref 3.5–5.1)
SODIUM: 139 mmol/L (ref 135–145)
TCO2: 39 mmol/L — ABNORMAL HIGH (ref 22–32)

## 2017-12-22 LAB — BRAIN NATRIURETIC PEPTIDE: B Natriuretic Peptide: >4500 pg/mL — ABNORMAL HIGH (ref 0.0–100.0)

## 2017-12-22 LAB — TROPONIN I: TROPONIN I: 0.04 ng/mL — AB (ref <0.03)

## 2017-12-22 LAB — I-STAT TROPONIN, ED: Troponin i, poc: 0.04 ng/mL (ref 0.00–0.08)

## 2017-12-22 LAB — PROTIME-INR
INR: 1.05
PROTHROMBIN TIME: 13.6 s (ref 11.4–15.2)

## 2017-12-22 LAB — MAGNESIUM: Magnesium: 2.2 mg/dL (ref 1.7–2.4)

## 2017-12-22 MED ORDER — CLONIDINE HCL 0.1 MG PO TABS
0.1000 mg | ORAL_TABLET | Freq: Once | ORAL | Status: AC
  Administered 2017-12-22: 0.1 mg via ORAL
  Filled 2017-12-22: qty 1

## 2017-12-22 MED ORDER — CLONIDINE HCL 0.3 MG/24HR TD PTWK: 0.3000 mg | MEDICATED_PATCH | TRANSDERMAL | Status: DC

## 2017-12-22 MED ORDER — POLYETHYLENE GLYCOL 3350 17 G PO PACK
17.0000 g | PACK | Freq: Every day | ORAL | Status: DC
  Administered 2017-12-22: 17 g via ORAL
  Filled 2017-12-22 (×2): qty 1

## 2017-12-22 MED ORDER — HYDRALAZINE HCL 50 MG PO TABS
100.0000 mg | ORAL_TABLET | Freq: Three times a day (TID) | ORAL | Status: DC
  Administered 2017-12-22 – 2017-12-25 (×7): 100 mg via ORAL
  Filled 2017-12-22 (×7): qty 2

## 2017-12-22 MED ORDER — ACETAMINOPHEN 325 MG PO TABS
650.0000 mg | ORAL_TABLET | Freq: Every day | ORAL | Status: DC | PRN
  Administered 2017-12-22 (×2): 650 mg via ORAL
  Filled 2017-12-22 (×3): qty 2

## 2017-12-22 MED ORDER — SIMETHICONE 80 MG PO CHEW
80.0000 mg | CHEWABLE_TABLET | Freq: Four times a day (QID) | ORAL | Status: DC | PRN
  Administered 2017-12-23: 80 mg via ORAL
  Filled 2017-12-22: qty 1

## 2017-12-22 MED ORDER — HEPARIN SODIUM (PORCINE) 5000 UNIT/ML IJ SOLN
5000.0000 [IU] | Freq: Three times a day (TID) | INTRAMUSCULAR | Status: DC
  Administered 2017-12-23 – 2017-12-25 (×4): 5000 [IU] via SUBCUTANEOUS
  Filled 2017-12-22 (×6): qty 1

## 2017-12-22 MED ORDER — AMLODIPINE BESYLATE 10 MG PO TABS
10.0000 mg | ORAL_TABLET | Freq: Every day | ORAL | Status: DC
  Administered 2017-12-23: 10 mg via ORAL
  Filled 2017-12-22: qty 1

## 2017-12-22 MED ORDER — LABETALOL HCL 5 MG/ML IV SOLN
10.0000 mg | INTRAVENOUS | Status: DC | PRN
  Administered 2017-12-22 – 2017-12-23 (×5): 10 mg via INTRAVENOUS
  Filled 2017-12-22 (×5): qty 4

## 2017-12-22 MED ORDER — NITROGLYCERIN IN D5W 200-5 MCG/ML-% IV SOLN
0.0000 ug/min | INTRAVENOUS | Status: DC
  Administered 2017-12-22: 5 ug/min via INTRAVENOUS
  Administered 2017-12-23: 80 ug/min via INTRAVENOUS
  Filled 2017-12-22 (×2): qty 250

## 2017-12-22 MED ORDER — METOPROLOL TARTRATE 100 MG PO TABS
100.0000 mg | ORAL_TABLET | Freq: Two times a day (BID) | ORAL | Status: DC
  Administered 2017-12-22 – 2017-12-23 (×2): 100 mg via ORAL
  Filled 2017-12-22: qty 1
  Filled 2017-12-22: qty 4

## 2017-12-22 MED ORDER — TAMSULOSIN HCL 0.4 MG PO CAPS
0.4000 mg | ORAL_CAPSULE | ORAL | Status: DC
  Administered 2017-12-22 – 2017-12-24 (×2): 0.4 mg via ORAL
  Filled 2017-12-22 (×2): qty 1

## 2017-12-22 MED ORDER — SEVELAMER CARBONATE 800 MG PO TABS
3200.0000 mg | ORAL_TABLET | Freq: Two times a day (BID) | ORAL | Status: DC
  Administered 2017-12-23: 3200 mg via ORAL
  Administered 2017-12-24: 1600 mg via ORAL
  Administered 2017-12-24: 3200 mg via ORAL
  Filled 2017-12-22: qty 4
  Filled 2017-12-22: qty 1
  Filled 2017-12-22: qty 4
  Filled 2017-12-22: qty 6
  Filled 2017-12-22: qty 4

## 2017-12-22 NOTE — ED Notes (Signed)
Pt now on bipap, tolerating well

## 2017-12-22 NOTE — H&P (Addendum)
[facility] Admission History and Physical Service Pager: [phone]  Patient name: James Wall [place] Medical record number: [id number] Date of birth: Jun 14, 1965 Age: 52 y.o. Gender: male  Primary Care Provider: Sherene Sires, DO Consultants: Cardiology, nephology  Code Status: Full code  Chief Complaint: SOB   Assessment and Plan: James Wall is a 52 y.o. male presenting with shortness of breath and hypertensive urgency. PMH is significant for hypertension, ESRD on dialysis, BPH, generalized anxiety disorder, asthma.  #Hypertensive emergency vs urgency Patient's blood pressures consistently elevated greater than 147 systolic and 829 diastolic.  Patient has recent history of noncompliance with hypertension medication due to running out of medication. Patient is unsure of which medication, he reports it is a 171m pill, leaving either hydralazine or metoprolol. If metoprolol, patient could be experiencing rebound tachycardia, exacerbating SOB and cardiac pathology.  UDS is negative.  In addition, he reports that at dialysis, his blood pressures frequently in the 2562systolic.   Patient does not have any acute neurologic changes on exam or in behavior.  Patient's sudden onset orthopnea and dyspnea on exertion raise concern for cardiac end-organ damage though, likely acute insult on chronic issue.  The most likely cause of patient's hypertensive urgency is volume overload due to ESRD.  EKG significant for prolonged PR interval and QT interval.  Possible ST depression in lead V4, V5, V6.  Patient did not complain of any chest pain.  Patient was started on nitroglycerin drip in the ED.  During interview, drip was being titrated upwards to 45 ml/hr Admit to FMTS - Unit: stepdown - Attending: WMingo Amber Continuous Cardiac monitoring & pulse ox Vitals per unit protocol Diet PO, heart healthy/renal  Out of bed with assistance, fall risk  Initial consults: nephrology,  possibly cardiology  Continue nitroglycerin drip  Acetaminophen for headache pain  Goal is to reduce map by 10-20% in the first hour and 5-15% over the remaining 23 hours. (Unless suspicion for complications).   Plan for dialysis tomorrow  Monitor closely for symptoms of acute aortic dissection, ischemic stroke, acute intracerebral hemorrhage  Continue home hypertension medications   AM EKG, RFP, CBC  #SOB likely secondary to acute congestive heart failure exacerbation Differential includes heart failure exacerbation,  volume overload secondary to ESRD, anxiety, asthma.  Initial work-up significant for BNP greater than 4500 (with dialysis yesterday), hypertension consistently in the 2130systolic.  BNP could be elevated partially due to lack of clearance due to ESRD, but a work-up for CHF is still warranted.  Patient reports that he has had orthopnea since the weekend.  Suspicion for structural cardiac etiology as shortness of breath and orthopnea have been subacute in nature; however, patient has long-standing history of uncontrolled hypertension.  Chest x-ray was negative for left ventricular failure or any acute cardiopulmonary process.  Most recent echo done in 2014 showing ejection fraction 60 to 65%.  Echocardiogram tomorrow  Consider cardiology consult   Avoid zofran for nausea  Follow troponin x 3  #ESRD on dialysis, with 2' hyperparathyroidism Patient has dialysis on Tuesday Thursday Saturday.  Patient wanted dialysis yesterday.  He reports that his dialysis sessions have not been consistent.  For example, patient reports variations in dry weight.  Patient takes 1 tablet Sensipar with hemodialysis.  He also takes sevelamer carbonate 1-2 times daily with meals.  Renally dose medications  Continue home sevelamer  Consult nephrology  Patient's dry weight has been variable per patient  RFP in AM  #Hypertension At  home, patient is on hydralazine 100 mg 3 times daily,  metoprolol 100 mg twice daily, amlodipine 10 mg, clonidine patch 0.3 mg q. weekly  #BPH At home, Flomax 0.4 mg every other day.  Patient continues to make urine, in the mornings only.  Continue home medication  #GAD At home, patient taking Ativan 0.5 mg twice daily as needed for anxiety.    Holding home medication as patient reports not taking Ativan  Anemia of Chronic Disease  Baseline Hgb ~ 8-9. Hgb on admission 8.9. Currently stable. Not likely to be causing tachycardia or SOB.  Continue to monitor  Electrolytes: Replete PRN Nutrition: PO  heart healthy, renal diet GI ppx: avoid zofran for nausea  DVT ppx:  Heparin   Future labs: AM EKG, RFP, CBC Disposition: Admit to stepdown  History of the Present Illness  James Wall is a 52 y.o. male who presents with emesis, SOB, and hypertensive urgency.  His emesis started on Friday, November 15, and his shortness of breath with exertion and when supine started on November 18.  He has felt like he was going to faint when walking and movement since November 18.  He has also vomited twice daily since November 15, though patient has had weak stomach for several years and reports vomiting about 3-4 times a week for 3 years, which has caused him to lose weight.  He says he is normally adherent to his medications, but he has been out of either his hydralazine or metoprolol since Friday, November 15.    In the ED, patient's CMP was significant for creatinine of 10.52, chloride 94, GFR 5/6.  Anion gap of 16.  Magnesium was normal at 2.2.  BNP was >4500.  CBC showed hemoglobin 8.9 (around baseline), hematocrit 29.5.  I-STAT troponin was 0.04.  Patient was placed on BiPAP to improve his shortness of breath.  He was able to speak in full sentences without dyspnea during interview.  Portable chest x-ray showed thoracic aortic atherosclerosis.  Patient was given clonidine 0.1 mg.  Labetalol 10 mg x 2.  He was started on a nitroglycerin drip and  admitted to stepdown.  No head imaging was done in ED as patient's symptoms were not concerning for neurologic problem.  Review Of Systems: Per HPI, including the following: Review of Systems  Constitutional: Negative for chills and fever.  Respiratory: Positive for cough and sputum production.   Cardiovascular: Positive for chest pain. Negative for leg swelling.  Gastrointestinal: Positive for nausea and vomiting. Negative for constipation and diarrhea.  Neurological: Positive for dizziness. Negative for loss of consciousness.    Past Medical History: Past Medical History:  Diagnosis Date  . Anemia   . Anxiety   . Arthritis    GOUT - pt not sure if this is true  . 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 Cirrhosis, nonalcoholic ([place])   . COPD (chronic obstructive pulmonary disease) ([place])   . Depression   . ESRD on hemodialysis [facility])    Started HD Jan 2009.  ESRD was due to HTN.  Dx'd with HTN in hospital 1996 according to pt, they had to keep him so he could get Medicaid to afford the BP medications.  First saw a nephrologist and started HD in the same year 2009.  Gets HD at [facility] on a MWF schedule.  Does not have DM. He had a left RC AVF that never functioned, a left upper arm AVF that worked for about 5 years and as  of June  . GERD (gastroesophageal reflux disease)   . Hypertension   . Pneumonia 03/2017  . Renal insufficiency   . Secondary hyperparathyroidism ([place])   . Sepsis ([place]) 02/2013   from AVF , treated with Vancomycin.  Marland Kitchen Shortness of breath    With exertion  . Sleep apnea    no  longer using cpap   Patient Active Problem List   Diagnosis Date Noted  . Situational insomnia 09/24/2017  . Generalized anxiety disorder 09/17/2017  . BPH (benign prostatic hyperplasia) 09/12/2017  . Bacteremia associated with intravascular line ([place]) 09/11/2017  . Dry skin 05/05/2017  . Cirrhosis ([place])   . LLQ pain   . Hyperkalemia 03/09/2017  . Hypertensive urgency 03/09/2017  .  Complication of AV dialysis fistula 10/22/2015  . ESRD on dialysis ([place]) 10/22/2015  . Elevated d-dimer 07/12/2013  . Abnormal cardiovascular function study 07/12/2013  . Chest pain at rest 07/04/2013  . Abnormal stress test 06/22/2013  . GERD (gastroesophageal reflux disease) 12/21/2012  . Anemia of renal disease 12/21/2012  . Essential hypertension 04/06/2008  . ASTHMA 04/06/2008  . SLEEP APNEA 04/06/2008    Past Surgical History: Past Surgical History:  Procedure Laterality Date  . AV FISTULA PLACEMENT  2009   Left lower arm AVF  . AV FISTULA PLACEMENT Right 02/22/2013   Procedure:  CREATION  OF BRACHIAL CEPHALIC FISTULA RIGHT ARM;  Surgeon: Elam Dutch, MD;  Location: [facility];  Service: Vascular;  Laterality: Right;  . AV FISTULA PLACEMENT Left 08/10/2014   Procedure: BASILIC VEIN TRANSPOSITION  ARTERIOVENOUS (AV) FISTULA CREATION LEFT UPPER ARM;  Surgeon: Mal Misty, MD;  Location: [facility];  Service: Vascular;  Laterality: Left;  . COLONOSCOPY    . ESOPHAGOGASTRODUODENOSCOPY (EGD) WITH PROPOFOL N/A 04/12/2013   Procedure: ESOPHAGOGASTRODUODENOSCOPY (EGD) WITH PROPOFOL;  Surgeon: Arta Silence, MD;  Location: [facility] END[facility];  Service: Endoscopy;  Laterality: N/A;  . INSERTION OF DIALYSIS CATHETER N/A 12/23/2012   Procedure: INSERTION OF DIALYSIS CATHETER; ULTRASOUND GUIDED;  Surgeon: Angelia Mould, MD;  Location[facility] OR;  Service: Vascular;  Laterality: N/A;  . INSERTION OF DIALYSIS CATHETER  10/22/2015   Right IJ non-tunneled HD catheter  . IR GENERIC HISTORICAL  10/22/2015   IR US GUIDE VASC ACCESS RIGHT 10/22/2015 [facility]  . IR GENERIC HISTORICAL  10/22/2015   IR FLUORO GUIDE CV LINE RIGHT 10/22/2015 [facility]  . IR GENERIC HISTORICAL  10/23/2015   IR FLUORO GUIDE CV LINE RIGHT 10/23/2015 Marybelle Killings, MD [facility]  . LEFT HEART CATHETERIZATION WITH CORONARY ANGIOGRAM N/A 07/13/2013   Procedure: LEFT HEART CATHETERIZATION WITH CORONARY ANGIOGRAM;  Surgeon:  Jettie Booze, MD;  Location: [facility] CATH LAB;  Service: Cardiovascular;  Laterality: N/A;  . LIGATION OF ARTERIOVENOUS  FISTULA Left 12/22/2012   Procedure: LIGATION OF ARTERIOVENOUS  FISTULA;EXCISION OF LARGE ANEURYSMS;;  Surgeon: Elam Dutch, MD;  Location: [facility] OR;  Service: Vascular;  Laterality: Left;    Social History: Social History   Tobacco Use  . Smoking status: Former Smoker    Packs/day: 0.50    Years: 16.00    Pack years: 8.00    Types: Cigarettes    Last attempt to quit: 03/03/2017    Years since quitting: 0.8  . Smokeless tobacco: Never Used  Substance Use Topics  . Alcohol use: Yes    Alcohol/week: 0.0 standard drinks    Comment: occassional  . Drug use: No   Patient lives in [place] in a house, by  himself.  He reports that his mother passed 3 years ago on Christmas.  Previously, he was active and taking care of her.  He reports struggling to get up and go to dialysis daily, especially as he has had bad experiences at the dialysis center.  Family History: Family History  Problem Relation Age of Onset  . Hypertension Mother   . Cerebrovascular Accident Father   . Hypertension Father   . Congestive Heart Failure Brother   . Asthma Brother    Allergies and Medications: Allergies  Allergen Reactions  . Aspirin Other (See Comments)    Made congestion worse   No current facility-administered medications on file prior to encounter.    Current Outpatient Medications on File Prior to Encounter  Medication Sig Dispense Refill  . acetaminophen (TYLENOL) 650 MG CR tablet Take 1 tablet (650 mg total) by mouth daily as needed for pain.    Marland Kitchen amLODipine (NORVASC) 10 MG tablet Take 1 tablet (10 mg total) by mouth daily. 90 tablet 2  . cloNIDine (CATAPRES - DOSED IN MG/24 HR) 0.3 mg/24hr Place 0.3 mg onto the skin every Sunday. Saturday.    . hydrALAZINE (APRESOLINE) 100 MG tablet TAKE 1 TABLET BY MOUTH 3 TIMES DAILY (Patient taking differently: Take 100 mg by  mouth 3 (three) times daily. ) 270 tablet 0  . hydrOXYzine (ATARAX/VISTARIL) 25 MG tablet Take 1 tablet (25 mg total) by mouth every 8 (eight) hours as needed for itching. Further refills need to come from your PCP. 30 tablet 3  . metoprolol tartrate (LOPRESSOR) 100 MG tablet Take 100 mg by mouth 2 (two) times daily.    . SENSIPAR 60 MG tablet Take 1 tablet (60 mg total) by mouth every Monday, Wednesday, and Friday with hemodialysis. (Patient taking differently: Take 60 mg by mouth every Monday, Wednesday, and Friday with hemodialysis. Liquid they put in his IV at dialysys)    . sevelamer carbonate (RENVELA) 800 MG tablet Take 3,200-4,800 mg by mouth See admin instructions. Take 4-6 tablets (3200-4800 mg) by mouth one to two times daily with meals    . tamsulosin (FLOMAX) 0.4 MG CAPS capsule Take 0.4 mg by mouth every other day.   0  . LORazepam (ATIVAN) 0.5 MG tablet Take 1 tablet (0.5 mg total) by mouth 2 (two) times daily as needed for anxiety. (Patient not taking: Reported on 12/22/2017) 30 tablet 1    Objective: Physical Exam BP (!) 218/123   Pulse 77   Temp 98.8 F (37.1 C) (Oral)   Resp (!) 32   Ht 5' 5"  (1.651 m)   Wt 87.1 kg   SpO2 100%   BMI 31.95 kg/m  Wt Readings from Last 3 Encounters:  12/22/17 87.1 kg  09/15/17 88 kg  09/11/17 85.9 kg    Gen: NAD, alert, nontoxic, wearing BiPAP mask, speaking comfortably without shortness of breath. HEENT: NCAT PERRLA, clear conjuctiva, no scleral icterus and injection. Normal EOM.  CV: Regular rate and rhythm.  S1, minimal S2.  No murmur, gallops, rubs appreciated.  Normal cap refill bilaterally.  RP 2+ bilaterally. 1 + BLEE to the knees. Resp: Mild crackles on posterior lung bases.  Good air movement.. Abd: NTND on palpation to all 4 quadrants. No masses palpated. Positive bowel sounds. Back: No CVAT. Spine symmetric with no TTP on spinous processes.  Skin: No obvious rashes, lesions, or trauma.  Normal turgor.  MSK: Normal ROM.  Normal muscle strength and tone.  Neuro: Alert and oriented x4. Cranial  nerves II through VI grossly intact. Gait not appreciated.  No obvious abnormal movements. Psych: Cooperative with exam.  Normal speech. Pleasant. Makes good eye contact. Genitourinary: deferred.   Labs and Imaging: CBC BMET  Recent Labs  Lab 12/22/17 1409 12/22/17 1418  WBC 5.9  --   HGB 8.9* 10.2*  HCT 29.5* 30.0*  PLT 171  --    Recent Labs  Lab 12/22/17 1409 12/22/17 1418  NA 141 139  K 3.9 3.6  CL 94* 91*  CO2 31  --   BUN 18 20  CREATININE 10.52* 10.50*  GLUCOSE 89 91  CALCIUM 10.1  --      BNP > 4500 EKG, prolonged QT interval  Imaging/Diagnostic Tests: Dg Chest Portable 1 View  Result Date: 12/22/2017 CLINICAL DATA:  Shortness of breath, productive cough, and vomiting since yesterday after dialysis. Current smoker, diabetes. EXAM: PORTABLE CHEST 1 VIEW COMPARISON:  PA and lateral chest x-ray of September 11, 2017 FINDINGS: The lungs are well-expanded. The interstitial markings are increased. The pulmonary vascularity is engorged. The cardiac silhouette is enlarged. There is no pleural effusion. There is calcification in the wall of the thoracic aorta. The dialysis catheter tip projects just above the cavoatrial junction. IMPRESSION: CHF with mild interstitial edema. Thoracic aortic atherosclerosis. Electronically Signed   By: David  Martinique M.D.   On: 12/22/2017 14:26     EKG Interpretation  Date/Time:  Tuesday December 22 2017 13:59:53 EST Ventricular Rate:  84 PR Interval:    QRS Duration: 95 QT Interval:  416 QTC Calculation: 492 R Axis:   26 Text Interpretation:  Sinus rhythm Prolonged PR interval Probable left atrial enlargement Borderline T abnormalities, lateral leads Borderline prolonged QT interval Abnormal ekg Confirmed by Carmin Muskrat [phone]) on 12/22/2017 2:08:09 PM      Wilber Oliphant, M.D. 12/22/2017, 3:39 PM PGY-1, [place] Intern pager: [phone],  text pages welcome

## 2017-12-22 NOTE — Progress Notes (Signed)
RT removed patient from BIPAP and placed him on 2L nasal canula per MD request. RN aware vital signs stable at this time.

## 2017-12-22 NOTE — ED Notes (Addendum)
Pt removed Nasal cannula due to drying his nose out. Pt placed on humidified oxygen at 6L and tolerating well. Pt is tachypneic.

## 2017-12-22 NOTE — ED Provider Notes (Signed)
[facility] EMERGENCY DEPARTMENT Provider Note   CSN: [id number] Arrival date & time: 12/22/17  1341     History   Chief Complaint Chief Complaint  Patient presents with  . Shortness of Breath  . Emesis    HPI James Wall is a 52 y.o. male.  HPI Patient presents with dyspnea. Patient acknowledges multiple medical issues including end-stage renal disease, had dialysis yesterday, as well as COPD. He notes that since yesterday he has had worsening dyspnea, with activity, and at rest. No persistent chest pain, no syncope. Patient only took some of his home medications this morning, and with worsening dyspnea presents for evaluation. He notes recent difficulty with obtaining consistent weights during dialysis, as well as episodes of nausea with vomiting over the past 3 weeks. He denies specific abdominal pain currently.  Past Medical History:  Diagnosis Date  . Anemia   . Anxiety   . Arthritis    GOUT - pt not sure if this is true  . AV fistula occlusion ([place]) 10/2015  . Cirrhosis, nonalcoholic ([place])   . COPD (chronic obstructive pulmonary disease) ([place])   . Depression   . ESRD on hemodialysis [facility])    Started HD Jan 2009.  ESRD was due to HTN.  Dx'd with HTN in hospital 1996 according to pt, they had to keep him so he could get Medicaid to afford the BP medications.  First saw a nephrologist and started HD in the same year 2009.  Gets HD at [facility] on a MWF schedule.  Does not have DM. He had a left RC AVF that never functioned, a left upper arm AVF that worked for about 5 years and as of June  . GERD (gastroesophageal reflux disease)   . Hypertension   . Pneumonia 03/2017  . Renal insufficiency   . Secondary hyperparathyroidism ([place])   . Sepsis ([place]) 02/2013   from AVF , treated with Vancomycin.  Marland Kitchen Shortness of breath    With exertion  . Sleep apnea    no  longer using cpap    Patient Active Problem List   Diagnosis Date Noted  .  Situational insomnia 09/24/2017  . Generalized anxiety disorder 09/17/2017  . BPH (benign prostatic hyperplasia) 09/12/2017  . Bacteremia associated with intravascular line ([place]) 09/11/2017  . Dry skin 05/05/2017  . Cirrhosis ([place])   . LLQ pain   . Hyperkalemia 03/09/2017  . Hypertensive urgency 03/09/2017  . Complication of AV dialysis fistula 10/22/2015  . ESRD on dialysis ([place]) 10/22/2015  . Elevated d-dimer 07/12/2013  . Abnormal cardiovascular function study 07/12/2013  . Chest pain at rest 07/04/2013  . Abnormal stress test 06/22/2013  . Shortness of breath 05/31/2013  . GERD (gastroesophageal reflux disease) 12/21/2012  . Anemia of renal disease 12/21/2012  . Essential hypertension 04/06/2008  . ASTHMA 04/06/2008  . SLEEP APNEA 04/06/2008    Past Surgical History:  Procedure Laterality Date  . AV FISTULA PLACEMENT  2009   Left lower arm AVF  . AV FISTULA PLACEMENT Right 02/22/2013   Procedure:  CREATION  OF BRACHIAL CEPHALIC FISTULA RIGHT ARM;  Surgeon: Elam Dutch, MD;  Location: [place];  Service: Vascular;  Laterality: Right;  . AV FISTULA PLACEMENT Left 08/10/2014   Procedure: BASILIC VEIN TRANSPOSITION  ARTERIOVENOUS (AV) FISTULA CREATION LEFT UPPER ARM;  Surgeon: Mal Misty, MD;  Location: [place];  Service: Vascular;  Laterality: Left;  . COLONOSCOPY    . ESOPHAGOGASTRODUODENOSCOPY (EGD) WITH PROPOFOL  N/A 04/12/2013   Procedure: ESOPHAGOGASTRODUODENOSCOPY (EGD) WITH PROPOFOL;  Surgeon: Arta Silence, MD;  Location: [facility];  Service: Endoscopy;  Laterality: N/A;  . INSERTION OF DIALYSIS CATHETER N/A 12/23/2012   Procedure: INSERTION OF DIALYSIS CATHETER; ULTRASOUND GUIDED;  Surgeon: Angelia Mould, MD;  Location: [facility] OR;  Service: Vascular;  Laterality: N/A;  . INSERTION OF DIALYSIS CATHETER  10/22/2015   Right IJ non-tunneled HD catheter  . IR GENERIC HISTORICAL  10/22/2015   IR US GUIDE VASC ACCESS RIGHT 10/22/2015 [facility]  . IR GENERIC  HISTORICAL  10/22/2015   IR FLUORO GUIDE CV LINE RIGHT 10/22/2015 [facility]  . IR GENERIC HISTORICAL  10/23/2015   IR FLUORO GUIDE CV LINE RIGHT 10/23/2015 Marybelle Killings, MD [facility]INTER[facility]  . LEFT HEART CATHETERIZATION WITH CORONARY ANGIOGRAM N/A 07/13/2013   Procedure: LEFT HEART CATHETERIZATION WITH CORONARY ANGIOGRAM;  Surgeon: Jettie Booze, MD;  Location: [facility] CATH LAB;  Service: Cardiovascular;  Laterality: N/A;  . LIGATION OF ARTERIOVENOUS  FISTULA Left 12/22/2012   Procedure: LIGATION OF ARTERIOVENOUS  FISTULA;EXCISION OF LARGE ANEURYSMS;;  Surgeon: Elam Dutch, MD;  Location: [place];  Service: Vascular;  Laterality: Left;        Home Medications    Prior to Admission medications   Medication Sig Start Date End Date Taking? Authorizing Provider  acetaminophen (TYLENOL) 650 MG CR tablet Take 1 tablet (650 mg total) by mouth daily as needed for pain. 03/17/17  Yes Mariel Aloe, MD  amLODipine (NORVASC) 10 MG tablet Take 1 tablet (10 mg total) by mouth daily. 07/14/17  Yes Dorothy Spark, MD  cloNIDine (CATAPRES - DOSED IN MG/24 HR) 0.3 mg/24hr Place 0.3 mg onto the skin every Sunday. Saturday.   Yes [provider]  hydrALAZINE (APRESOLINE) 100 MG tablet TAKE 1 TABLET BY MOUTH 3 TIMES DAILY Patient taking differently: Take 100 mg by mouth 3 (three) times daily.  12/11/17  Yes Sherene Sires, DO  hydrOXYzine (ATARAX/VISTARIL) 25 MG tablet Take 1 tablet (25 mg total) by mouth every 8 (eight) hours as needed for itching. Further refills need to come from your PCP. 04/23/17  Yes Dorothy Spark, MD  metoprolol tartrate (LOPRESSOR) 100 MG tablet Take 100 mg by mouth 2 (two) times daily. 04/22/17  Yes [provider]  SENSIPAR 60 MG tablet Take 1 tablet (60 mg total) by mouth every Monday, Wednesday, and Friday with hemodialysis. Patient taking differently: Take 60 mg by mouth every Monday, Wednesday, and Friday with hemodialysis. Liquid they put in his IV at  dialysys 03/17/17  Yes Mariel Aloe, MD  sevelamer carbonate (RENVELA) 800 MG tablet Take 3,200-4,800 mg by mouth See admin instructions. Take 4-6 tablets (3200-4800 mg) by mouth one to two times daily with meals   Yes [provider]  tamsulosin (FLOMAX) 0.4 MG CAPS capsule Take 0.4 mg by mouth every other day.  08/01/14  Yes [provider]  LORazepam (ATIVAN) 0.5 MG tablet Take 1 tablet (0.5 mg total) by mouth 2 (two) times daily as needed for anxiety. Patient not taking: Reported on 12/22/2017 09/30/17   Sherene Sires, DO    Family History Family History  Problem Relation Age of Onset  . Hypertension Mother   . Cerebrovascular Accident Father   . Hypertension Father   . Congestive Heart Failure Brother   . Asthma Brother     Social History Social History   Tobacco Use  . Smoking status: Former Smoker    Packs/day: 0.50  Years: 16.00    Pack years: 8.00    Types: Cigarettes    Last attempt to quit: 03/03/2017    Years since quitting: 0.8  . Smokeless tobacco: Never Used  Substance Use Topics  . Alcohol use: Yes    Alcohol/week: 0.0 standard drinks    Comment: occassional  . Drug use: No     Allergies   Aspirin   Review of Systems Review of Systems  Constitutional:       Per HPI, otherwise negative  HENT:       Per HPI, otherwise negative  Respiratory:       Per HPI, otherwise negative  Cardiovascular:       Per HPI, otherwise negative  Gastrointestinal: Negative for vomiting.  Endocrine:       Negative aside from HPI  Genitourinary:       Neg aside from HPI   Musculoskeletal:       Per HPI, otherwise negative  Skin: Negative.   Allergic/Immunologic: Positive for immunocompromised state.  Neurological: Positive for weakness. Negative for syncope.     Physical Exam Updated Vital Signs BP (!) 213/119   Pulse 76   Temp 98.8 F (37.1 C) (Oral)   Resp 16   Ht 5' 5"  (1.651 m)   Wt 87.1 kg   SpO2 99%   BMI 31.95 kg/m   Physical  Exam  Constitutional: He is oriented to person, place, and time. He appears well-developed.  Non-toxic appearance. He appears ill.  HENT:  Head: Normocephalic and atraumatic.  Eyes: Conjunctivae and EOM are normal.  Cardiovascular: Regular rhythm. Tachycardia present.  Pulmonary/Chest: Tachypnea noted. He is in respiratory distress. He has decreased breath sounds. He has wheezes.    Abdominal: He exhibits no distension.  Musculoskeletal: He exhibits no edema.  Neurological: He is alert and oriented to person, place, and time.  Skin: Skin is warm and dry.  Psychiatric: He has a normal mood and affect.  Nursing note and vitals reviewed.    ED Treatments / Results  Labs (all labs ordered are listed, but only abnormal results are displayed) Labs Reviewed  COMPREHENSIVE METABOLIC PANEL - Abnormal; Notable for the following components:      Result Value   Chloride 94 (*)    Creatinine, Ser 10.52 (*)    GFR calc non Af Amer 5 (*)    GFR calc Af Amer 6 (*)    Anion gap 16 (*)    All other components within normal limits  CBC WITH DIFFERENTIAL/PLATELET - Abnormal; Notable for the following components:   RBC 3.24 (*)    Hemoglobin 8.9 (*)    HCT 29.5 (*)    RDW 21.1 (*)    All other components within normal limits  I-STAT CHEM 8, ED - Abnormal; Notable for the following components:   Chloride 91 (*)    Creatinine, Ser 10.50 (*)    Calcium, Ion 1.11 (*)    TCO2 39 (*)    Hemoglobin 10.2 (*)    HCT 30.0 (*)    All other components within normal limits  MAGNESIUM  BRAIN NATRIURETIC PEPTIDE  PROTIME-INR  CBG MONITORING, ED  I-STAT TROPONIN, ED    EKG EKG Interpretation  Date/Time:  Tuesday December 22 2017 13:59:53 EST Ventricular Rate:  84 PR Interval:    QRS Duration: 95 QT Interval:  416 QTC Calculation: 492 R Axis:   26 Text Interpretation:  Sinus rhythm Prolonged PR interval Probable left atrial enlargement Borderline T abnormalities,  lateral leads Borderline  prolonged QT interval Abnormal ekg Confirmed by Carmin Muskrat [phone]) on 12/22/2017 2:08:09 PM   Radiology Dg Chest Portable 1 View  Result Date: 12/22/2017 CLINICAL DATA:  Shortness of breath, productive cough, and vomiting since yesterday after dialysis. Current smoker, diabetes. EXAM: PORTABLE CHEST 1 VIEW COMPARISON:  PA and lateral chest x-ray of September 11, 2017 FINDINGS: The lungs are well-expanded. The interstitial markings are increased. The pulmonary vascularity is engorged. The cardiac silhouette is enlarged. There is no pleural effusion. There is calcification in the wall of the thoracic aorta. The dialysis catheter tip projects just above the cavoatrial junction. IMPRESSION: CHF with mild interstitial edema. Thoracic aortic atherosclerosis. Electronically Signed   By: David  Martinique M.D.   On: 12/22/2017 14:26    Procedures Procedures (including critical care time)  Medications Ordered in ED Medications  labetalol (NORMODYNE,TRANDATE) injection 10 mg (10 mg Intravenous Given 12/22/17 1509)  nitroGLYCERIN 50 mg in dextrose 5 % 250 mL (0.2 mg/mL) infusion (has no administration in time range)  cloNIDine (CATAPRES) tablet 0.1 mg (0.1 mg Oral Given 12/22/17 1445)     Initial Impression / Assessment and Plan / ED Course  I have reviewed the triage vital signs and the nursing notes.  Pertinent labs & imaging results that were available during my care of the patient were reviewed by me and considered in my medical decision making (see chart for details).     Patient's blood pressure 240/140 on arrival. Patient notes that he did not take his clonidine this morning. Patient will receive labetalol, clonidine, require close monitoring.   3:53 PM Blood pressure minimally changed after labetalol x2, clonidine. Patient starting a nitroglycerin drip. Patient is on BiPAP, states that he feels better, he appears better clinically. No evidence for pneumonia, patient is afebrile,, but  suspicion for fluid overload status, and with concern for hypertensive urgency, the patient will require admission to the stepdown unit.   Final Clinical Impressions(s) / ED Diagnoses   Final diagnoses:  Hypertensive urgency   CRITICAL CARE Performed by: Carmin Muskrat Total critical care time: 35 minutes Critical care time was exclusive of separately billable procedures and treating other patients. Critical care was necessary to treat or prevent imminent or life-threatening deterioration. Critical care was time spent personally by me on the following activities: development of treatment plan with patient and/or surrogate as well as nursing, discussions with consultants, evaluation of patient's response to treatment, examination of patient, obtaining history from patient or surrogate, ordering and performing treatments and interventions, ordering and review of laboratory studies, ordering and review of radiographic studies, pulse oximetry and re-evaluation of patient's condition.    Carmin Muskrat, MD 12/22/17 [phone]

## 2017-12-22 NOTE — ED Triage Notes (Signed)
Pt endorses shob with n/v x 3 days. Last dialysis treatment yesterday where he got 3 hours of a 3.5hr treatment and stopped early due to shob. Pt's spo2 80% on RA in room, placed on 3L [place]. Axox4.

## 2017-12-22 NOTE — ED Notes (Addendum)
Pt taken off of BIPAP and tolerating well. Pt is on humidified oxygen [place]

## 2017-12-22 NOTE — Progress Notes (Signed)
Patient arrived via stretcher from ED - pt with tunneled HD cath, dressing consisted of 2x2 and hyperfix tape both of which were falling off. Old dressing removed and new dressing placed with biopatch per protocol.

## 2017-12-23 ENCOUNTER — Inpatient Hospital Stay (HOSPITAL_COMMUNITY): Payer: Medicare Other

## 2017-12-23 DIAGNOSIS — I429 Cardiomyopathy, unspecified: Secondary | ICD-10-CM

## 2017-12-23 DIAGNOSIS — I361 Nonrheumatic tricuspid (valve) insufficiency: Secondary | ICD-10-CM

## 2017-12-23 DIAGNOSIS — Z992 Dependence on renal dialysis: Secondary | ICD-10-CM

## 2017-12-23 DIAGNOSIS — F411 Generalized anxiety disorder: Secondary | ICD-10-CM

## 2017-12-23 DIAGNOSIS — I160 Hypertensive urgency: Secondary | ICD-10-CM

## 2017-12-23 DIAGNOSIS — R0602 Shortness of breath: Secondary | ICD-10-CM

## 2017-12-23 DIAGNOSIS — I340 Nonrheumatic mitral (valve) insufficiency: Secondary | ICD-10-CM

## 2017-12-23 DIAGNOSIS — N400 Enlarged prostate without lower urinary tract symptoms: Secondary | ICD-10-CM

## 2017-12-23 DIAGNOSIS — R001 Bradycardia, unspecified: Secondary | ICD-10-CM

## 2017-12-23 DIAGNOSIS — I272 Pulmonary hypertension, unspecified: Secondary | ICD-10-CM

## 2017-12-23 DIAGNOSIS — N186 End stage renal disease: Secondary | ICD-10-CM

## 2017-12-23 LAB — POCT I-STAT 3, ART BLOOD GAS (G3+)
ACID-BASE EXCESS: 6 mmol/L — AB (ref 0.0–2.0)
Bicarbonate: 30.8 mmol/L — ABNORMAL HIGH (ref 20.0–28.0)
O2 Saturation: 100 %
Patient temperature: 97.9
TCO2: 32 mmol/L (ref 22–32)
pCO2 arterial: 43.9 mmHg (ref 32.0–48.0)
pH, Arterial: 7.453 — ABNORMAL HIGH (ref 7.350–7.450)
pO2, Arterial: 170 mmHg — ABNORMAL HIGH (ref 83.0–108.0)

## 2017-12-23 LAB — CBC
HEMATOCRIT: 26.1 % — AB (ref 39.0–52.0)
HEMOGLOBIN: 8.2 g/dL — AB (ref 13.0–17.0)
MCH: 27.8 pg (ref 26.0–34.0)
MCHC: 31.4 g/dL (ref 30.0–36.0)
MCV: 88.5 fL (ref 80.0–100.0)
Platelets: 174 10*3/uL (ref 150–400)
RBC: 2.95 MIL/uL — AB (ref 4.22–5.81)
RDW: 20.8 % — ABNORMAL HIGH (ref 11.5–15.5)
WBC: 6.7 10*3/uL (ref 4.0–10.5)
nRBC: 0 % (ref 0.0–0.2)

## 2017-12-23 LAB — RENAL FUNCTION PANEL
ALBUMIN: 3.2 g/dL — AB (ref 3.5–5.0)
ANION GAP: 15 (ref 5–15)
BUN: 22 mg/dL — ABNORMAL HIGH (ref 6–20)
CALCIUM: 9.9 mg/dL (ref 8.9–10.3)
CO2: 31 mmol/L (ref 22–32)
CREATININE: 11.54 mg/dL — AB (ref 0.61–1.24)
Chloride: 93 mmol/L — ABNORMAL LOW (ref 98–111)
GFR calc non Af Amer: 4 mL/min — ABNORMAL LOW (ref >60)
GFR, EST AFRICAN AMERICAN: 5 mL/min — AB (ref >60)
GLUCOSE: 99 mg/dL (ref 70–99)
Phosphorus: 4.6 mg/dL (ref 2.5–4.6)
Potassium: 3.6 mmol/L (ref 3.5–5.1)
SODIUM: 139 mmol/L (ref 135–145)

## 2017-12-23 LAB — TROPONIN I
Troponin I: 0.04 ng/mL (ref <0.03)
Troponin I: 0.03 ng/mL (ref <0.03)

## 2017-12-23 LAB — GLUCOSE, CAPILLARY: GLUCOSE-CAPILLARY: 129 mg/dL — AB (ref 70–99)

## 2017-12-23 LAB — MRSA PCR SCREENING: MRSA by PCR: NEGATIVE

## 2017-12-23 LAB — ECHOCARDIOGRAM COMPLETE
Height: 65 in
Weight: 2941.82 oz

## 2017-12-23 MED ORDER — HYDRALAZINE HCL 20 MG/ML IJ SOLN
INTRAMUSCULAR | Status: AC
  Administered 2017-12-23: 40 mg
  Filled 2017-12-23: qty 1

## 2017-12-23 MED ORDER — BUDESONIDE 0.5 MG/2ML IN SUSP
0.5000 mg | Freq: Two times a day (BID) | RESPIRATORY_TRACT | Status: DC
  Administered 2017-12-23 – 2017-12-25 (×4): 0.5 mg via RESPIRATORY_TRACT
  Filled 2017-12-23 (×4): qty 2

## 2017-12-23 MED ORDER — HYDRALAZINE HCL 20 MG/ML IJ SOLN
10.0000 mg | INTRAMUSCULAR | Status: DC | PRN
  Administered 2017-12-24: 40 mg via INTRAVENOUS
  Administered 2017-12-24: 20 mg via INTRAVENOUS
  Filled 2017-12-23: qty 2
  Filled 2017-12-23: qty 1

## 2017-12-23 MED ORDER — HYDRALAZINE HCL 20 MG/ML IJ SOLN
INTRAMUSCULAR | Status: AC
  Filled 2017-12-23: qty 1

## 2017-12-23 MED ORDER — HEPARIN SODIUM (PORCINE) 1000 UNIT/ML IJ SOLN
3800.0000 [IU] | Freq: Once | INTRAMUSCULAR | Status: AC
  Administered 2017-12-23: 3800 [IU]

## 2017-12-23 MED ORDER — CALCITRIOL 0.25 MCG PO CAPS
ORAL_CAPSULE | ORAL | Status: AC
  Filled 2017-12-23: qty 6

## 2017-12-23 MED ORDER — ORAL CARE MOUTH RINSE
15.0000 mL | Freq: Two times a day (BID) | OROMUCOSAL | Status: DC
  Administered 2017-12-24: 15 mL via OROMUCOSAL

## 2017-12-23 MED ORDER — OXYCODONE HCL 5 MG PO TABS
5.0000 mg | ORAL_TABLET | Freq: Once | ORAL | Status: AC | PRN
  Administered 2017-12-23: 5 mg via ORAL
  Filled 2017-12-23: qty 1

## 2017-12-23 MED ORDER — ARFORMOTEROL TARTRATE 15 MCG/2ML IN NEBU
15.0000 ug | INHALATION_SOLUTION | Freq: Two times a day (BID) | RESPIRATORY_TRACT | Status: DC
  Administered 2017-12-23 – 2017-12-25 (×4): 15 ug via RESPIRATORY_TRACT
  Filled 2017-12-23 (×4): qty 2

## 2017-12-23 MED ORDER — IPRATROPIUM-ALBUTEROL 0.5-2.5 (3) MG/3ML IN SOLN
3.0000 mL | Freq: Four times a day (QID) | RESPIRATORY_TRACT | Status: DC
  Administered 2017-12-23 – 2017-12-25 (×7): 3 mL via RESPIRATORY_TRACT
  Filled 2017-12-23 (×8): qty 3

## 2017-12-23 MED ORDER — DARBEPOETIN ALFA 150 MCG/0.3ML IJ SOSY
PREFILLED_SYRINGE | INTRAMUSCULAR | Status: AC
  Filled 2017-12-23: qty 0.3

## 2017-12-23 MED ORDER — HYDRALAZINE HCL 20 MG/ML IJ SOLN: 10.0000 mg | INTRAMUSCULAR | Status: DC | PRN

## 2017-12-23 MED ORDER — NITROGLYCERIN IN D5W 200-5 MCG/ML-% IV SOLN
INTRAVENOUS | Status: AC
  Administered 2017-12-23: 20 ug/min via INTRAVENOUS
  Filled 2017-12-23: qty 250

## 2017-12-23 MED ORDER — DARBEPOETIN ALFA 150 MCG/0.3ML IJ SOSY
150.0000 ug | PREFILLED_SYRINGE | INTRAMUSCULAR | Status: DC
  Administered 2017-12-23: 150 ug via INTRAVENOUS
  Filled 2017-12-23: qty 0.3

## 2017-12-23 MED ORDER — OXYCODONE HCL 5 MG PO TABS
5.0000 mg | ORAL_TABLET | Freq: Once | ORAL | Status: AC
  Administered 2017-12-23: 5 mg via ORAL
  Filled 2017-12-23: qty 1

## 2017-12-23 MED ORDER — CALCITRIOL 0.5 MCG PO CAPS
2.0000 ug | ORAL_CAPSULE | ORAL | Status: DC
  Administered 2017-12-23: 2 ug via ORAL

## 2017-12-23 MED ORDER — METOPROLOL TARTRATE 25 MG PO TABS: 25.0000 mg | ORAL_TABLET | Freq: Two times a day (BID) | ORAL | Status: DC

## 2017-12-23 MED ORDER — LIDOCAINE 5 % EX PTCH
1.0000 | MEDICATED_PATCH | CUTANEOUS | Status: DC
  Administered 2017-12-23 – 2017-12-24 (×2): 1 via TRANSDERMAL
  Filled 2017-12-23 (×5): qty 1

## 2017-12-23 MED ORDER — HEPARIN SODIUM (PORCINE) 1000 UNIT/ML IJ SOLN
INTRAMUSCULAR | Status: AC
  Administered 2017-12-23: 1000 [IU]
  Filled 2017-12-23: qty 1

## 2017-12-23 MED ORDER — NICARDIPINE HCL IN NACL 20-0.86 MG/200ML-% IV SOLN
0.0000 mg/h | INTRAVENOUS | Status: DC
  Filled 2017-12-23: qty 200

## 2017-12-23 MED ORDER — SODIUM CHLORIDE 0.9 % IV SOLN
INTRAVENOUS | Status: DC | PRN
  Administered 2017-12-23: 1000 mL via INTRAVENOUS

## 2017-12-23 MED ORDER — HEPARIN SODIUM (PORCINE) 1000 UNIT/ML IJ SOLN
INTRAMUSCULAR | Status: AC
  Filled 2017-12-23: qty 4

## 2017-12-23 MED ORDER — HEPARIN SODIUM (PORCINE) 1000 UNIT/ML IJ SOLN
INTRAMUSCULAR | Status: AC
  Administered 2017-12-23: 4000 [IU]
  Filled 2017-12-23: qty 4

## 2017-12-23 MED ORDER — CALCITRIOL 0.5 MCG PO CAPS
ORAL_CAPSULE | ORAL | Status: AC
  Filled 2017-12-23: qty 1

## 2017-12-23 MED ORDER — HYDROXYZINE HCL 25 MG PO TABS
25.0000 mg | ORAL_TABLET | Freq: Three times a day (TID) | ORAL | Status: DC | PRN
  Administered 2017-12-24 (×2): 25 mg via ORAL
  Filled 2017-12-23 (×2): qty 1

## 2017-12-23 MED ORDER — NITROGLYCERIN IN D5W 200-5 MCG/ML-% IV SOLN
20.0000 ug/min | INTRAVENOUS | Status: DC
  Administered 2017-12-23: 20 ug/min via INTRAVENOUS
  Administered 2017-12-24: 120 ug/min via INTRAVENOUS
  Administered 2017-12-24: 150 ug/min via INTRAVENOUS
  Administered 2017-12-24: 160 ug/min via INTRAVENOUS
  Filled 2017-12-23 (×3): qty 250

## 2017-12-23 NOTE — Progress Notes (Addendum)
Family Medicine Teaching Service Daily Progress Note Intern Pager: [phone]  Patient name: James Wall [facility] Medical record number: [id number] Date of birth: 11-10-1965 Age: 52 y.o. Gender: male  Primary Care Provider: Sherene Sires, DO Consultants: Nephrology   Code Status: Full code  Pt Overview and Major Events to Date:  Admitted: 12/22/2017  Hospital Day: 2    Assessment and Plan: Chevy Sweigert is a 52 y.o. male who presented with shortness of breath and hypertension to 240/140 concerning for hypertensive urgency. PMHx (PTA) is significant for hypertension, ESRD on dialysis, BPH, generalized anxiety disorder, asthma. New hospital problems include abdominal pain.   #Hypertensive urgency versus emergency. Patient's blood pressures are trending down well.  His blood pressures overnight ranged from 165 to over 83-2 31/133.  He is responding well to the nitro drip.Marland Kitchen  He complains of headache pain this morning due to nitro drip.  He denies thunderclap headache, abdominal pain, chest pain.  Troponins were low and flat overnight.  Acetaminophen for headache  We will continue to continue nitro drip.  Consulted nephrology, appreciate recs for hypertension management  Likely dialysis today  Continue to monitor patient on stepdown  #SOB Shortness of breath is improved this morning.  He continues to have crackles on exam, mild.  He also continues to have 1+ edema to his knees.  BNP was high at 4500 with dialysis the day before.  CHF is high on the differential, however it is a mixed picture in the setting of ESRD.  Patient is sitting up in bed today and does not appear to have increased work of breathing.  Echocardiogram today  Repeat BNP this evening after dialysis?  #Abdominal pain Patient reported abdominal pain overnight, KUB was normal and with nonobstructive gas pattern..  This morning, he reports that that abdominal pain has resolved.  Continue to monitor  #ESRD on  dialysis and complicated by secondary hyperparathyroidism Dialysis Tuesday Thursday Saturday.  Consulting nephrology renal function panel this morning is significant for BUN 22, creatinine 11.  5 4.  Magnesium 2.2.  #Hypertension At home, hydralazine 100 mg 3 times daily, metoprolol 100 mg twice daily, amlodipine milligrams, clonidine patch 0.3 mg q. weekly.  Holding home medications with treatment as above.  #BPH  Continue Flomax 0.4 mg.  Patient makes urine in the mornings only.  #Generalized anxiety disorder  Holding home Ativan 0.5 mg daily.  Patient is not experiencing any withdrawal symptoms  #Anemia of chronic disease Hemoglobin is 8.2 this morning.  It is stable from 8.9 yesterday.  Continue to monitor, daily CBC  Electrolytes: Replete PRN  Nutrition: P.o., renal diet DVT ppx: Heparin subcut q8hrs  Future labs: AM renal function panel, CBC Disposition: Remain on stepdown until blood pressure improves  Medications: Scheduled Meds: . amLODipine  10 mg Oral Daily  . [START ON 12/27/2017] cloNIDine  0.3 mg Transdermal Q Sun  . heparin  5,000 Units Subcutaneous Q8H  . hydrALAZINE  100 mg Oral TID  . lidocaine  1 patch Transdermal Q24H  . mouth rinse  15 mL Mouth Rinse BID  . metoprolol tartrate  100 mg Oral BID  . polyethylene glycol  17 g Oral Daily  . sevelamer carbonate  3,200-4,800 mg Oral BID WC  . tamsulosin  0.4 mg Oral QODAY   Continuous Infusions: . nitroGLYCERIN 80 mcg/min (12/23/17 0532)   PRN Meds: acetaminophen, labetalol, simethicone  Subjective  Patient reports doing well overnight.  He reports that his abdominal pain has resolved.  He is  sitting up in bed comfortably.  He reports that his eyes are burning from the sun.  I closed his plans for him.  He also has a headache from the nitro drip.  But he is tolerating it well.  He has no other questions this morning.  He denies any chest pain, thunderclap headache pain, abdominal pain.  Objective:    Vital Signs Temp:  [98.8 F (37.1 C)] 98.8 F (37.1 C) (11/19 1918) Pulse Rate:  [64-116] 71 (11/20 0602) Resp:  [8-35] 15 (11/20 0602) BP: (165-231)/(83-133) 169/114 (11/20 0602) SpO2:  [86 %-100 %] 90 % (11/20 0602) Weight:  [85.7 kg-87.1 kg] 85.7 kg (11/20 0306) Patient Vitals for the past 24 hrs:  BP Temp Temp src Pulse Resp SpO2 Height Weight  12/23/17 0602 (!) 169/114 - - 71 15 90 % - -  12/23/17 0534 (!) 165/109 - - 72 (!) 23 96 % - -  12/23/17 0447 (!) 186/115 - - 66 20 (!) 87 % - -  12/23/17 0435 (!) 201/92 - - 64 (!) 22 90 % - -  12/23/17 0347 (!) 190/104 - - 73 17 92 % - -  12/23/17 0306 (!) 165/83 - - - - - - 85.7 kg  12/23/17 0222 (!) 165/83 - - 67 19 94 % - -  12/23/17 0147 (!) 186/109 - - 78 (!) 21 93 % - -  12/23/17 0100 (!) 179/91 - - 79 (!) 28 92 % - -  12/23/17 0050 (!) 184/101 - - 77 19 (!) 86 % - -  12/22/17 2100 (!) 200/109 - - 70 (!) 8 95 % - -  12/22/17 2013 (!) 198/119 - - 79 15 96 % - -  12/22/17 2000 (!) 206/106 - - 78 19 91 % - -  12/22/17 1918 (!) 200/128 98.8 F (37.1 C) Oral - - - - -  12/22/17 1900 (!) 200/128 - - 74 (!) 8 97 % - -  12/22/17 1850 (!) 196/112 - - 78 17 96 % - -  12/22/17 1800 (!) 205/133 - - - (!) 29 - - -  12/22/17 1730 (!) 209/122 - - 86 14 95 % 5' 5"  (1.651 m) 85.7 kg  12/22/17 1715 (!) 200/129 - - 79 18 96 % - -  12/22/17 1700 (!) 203/130 - - 78 17 100 % - -  12/22/17 1645 (!) 211/124 - - 73 11 100 % - -  12/22/17 1630 (!) 204/119 - - 76 (!) 25 99 % - -  12/22/17 1610 (!) 210/119 - - (!) 116 12 100 % - -  12/22/17 1600 (!) 210/110 - - 74 (!) 28 97 % - -  12/22/17 1545 (!) 213/127 - - 75 (!) 35 99 % - -  12/22/17 1530 (!) 213/119 - - 76 16 99 % - -  12/22/17 1515 (!) 212/114 - - 75 (!) 24 100 % - -  12/22/17 1512 - - - 77 (!) 32 100 % - -  12/22/17 1505 (!) 218/123 - - 76 (!) 27 100 % - -  12/22/17 1500 (!) 216/120 - - 81 19 100 % - -  12/22/17 1450 (!) 216/124 - - 91 15 - - -  12/22/17 1445 (!) 212/117 - - 71 (!) 24 (!)  89 % - -  12/22/17 1437 - - - - - - 5' 5"  (1.651 m) 87.1 kg  12/22/17 1430 (!) 231/130 - - 83 (!) 21 93 % - -  12/22/17  1400 (!) 224/130 98.8 F (37.1 C) Oral 90 16 93 % - -  12/22/17 1348 - 98.8 F (37.1 C) Oral - - - - -   Filed Weights   12/22/17 1437 12/22/17 1730 12/23/17 0306  Weight: 87.1 kg 85.7 kg 85.7 kg    Intake/Output  Intake/Output Summary (Last 24 hours) at 12/23/2017 0622 Last data filed at 12/23/2017 0102 Gross per 24 hour  Intake 148.5 ml  Output -  Net 148.5 ml   Intake/Output      11/19 0701 - 11/20 0700   I.V. (mL/kg) 148.5 (1.7)   Total Intake(mL/kg) 148.5 (1.7)   Net +148.5         Physical Exam Gen: NAD, alert, non-toxic, well-appearing, sitting comfortably in bed.  His head is leaning on the rail. Skin: Warm and dry HEENT: NCAT.  MMM.  CV: RRR.  Normal S1-S2.  1+ BLEE. Resp: Mild crackles on posterior bases. No increased WOB Abd: NTND on palpation to all 4 quadrants.  Pos bowel sounds Extremities: moves extremities spontaneously. Warm and well perfused.   Laboratory: Recent Labs  Lab 12/22/17 1409 12/22/17 1418 12/23/17 0108  WBC 5.9  --  6.7  HGB 8.9* 10.2* 8.2*  HCT 29.5* 30.0* 26.1*  PLT 171  --  174   Recent Labs  Lab 12/22/17 1409 12/22/17 1418 12/23/17 0108  NA 141 139 139  K 3.9 3.6 3.6  CL 94* 91* 93*  CO2 31  --  31  BUN 18 20 22*  CREATININE 10.52* 10.50* 11.54*  CALCIUM 10.1  --  9.9  PROT 7.2  --   --   BILITOT 1.2  --   --   ALKPHOS 40  --   --   ALT 12  --   --   AST 23  --   --   GLUCOSE 89 91 99    Imaging/Diagnostic Tests: Dg Abd 1 View  Result Date: 12/23/2017 CLINICAL DATA:  Upper abdominal pain tonight EXAM: ABDOMEN - 1 VIEW COMPARISON:  CT abdomen and pelvis 03/08/2017 FINDINGS: Scattered gas and stool throughout the colon. No small or large bowel distention. No radiopaque stones. Visualized bones and soft tissue contours appear intact. Vascular calcifications. Calcified phleboliths in the pelvis.  IMPRESSION: Nonobstructive bowel gas pattern. Electronically Signed   By: Lucienne Capers M.D.   On: 12/23/2017 01:32   Dg Chest Portable 1 View  Result Date: 12/22/2017 CLINICAL DATA:  Shortness of breath, productive cough, and vomiting since yesterday after dialysis. Current smoker, diabetes. EXAM: PORTABLE CHEST 1 VIEW COMPARISON:  PA and lateral chest x-ray of September 11, 2017 FINDINGS: The lungs are well-expanded. The interstitial markings are increased. The pulmonary vascularity is engorged. The cardiac silhouette is enlarged. There is no pleural effusion. There is calcification in the wall of the thoracic aorta. The dialysis catheter tip projects just above the cavoatrial junction. IMPRESSION: CHF with mild interstitial edema. Thoracic aortic atherosclerosis. Electronically Signed   By: David  Martinique M.D.   On: 12/22/2017 14:26     Wilber Oliphant, MD 12/23/2017, 6:22 AM PGY-1, [place] Intern pager: [phone], text pages welcome

## 2017-12-23 NOTE — Consult Note (Signed)
NAME:  James Wall, MRN:  [id number], DOB:  30-May-1965, LOS: 1 ADMISSION DATE:  12/22/2017, CONSULTATION DATE:  12/23/17 REFERRING MD:  Mingo Amber  CHIEF COMPLAINT:  SOB   Brief History   James Wall is a 52 y.o. male who was admitted 11/19 with dyspnea due to acute pulmonary edema in the setting of hypertensive urgency. Transferred to ICU 11/20 for emergent HD session.  History of present illness   James Wall is a 52 y.o. male who has a PMH as outlined below including but not limited to ESRD on HD MWF (see "past medical history").  He presented to [facility] ED  11/19 with dyspnea and hypertensive urgency.  He was admitted by FPTS and started on nitro gtt.  Nephrology ws consulted and started normal treatment 11/20 with plans to lower EDW.  He started his session but had to stop early due to experiencing cramps.  He had a total of 2.7L off during his session.  Later that afternoon, he developed worsening dyspnea and bradycardia.  He was noted to have significant pink colored frothy sputum.  Cardiology was consulted and recommended transfer to ICU.  Nephrology was called back and opted to add a second emergent HD session for volume removal with max UF.  Past Medical History  ESRD on HD MWF, OSA not on CPAP, COPD, HTN, GERD, depression, anxiety.  Significant Hospital Events   11/19 > admit. 11/2 > HD session stopped early due to cramping.  Needed 2nd emergent HD session with max UF.  Consults:  Nephrology 11/20. Cardiology 11/20. PCCM 11/20.  Procedures:  None.  Significant Diagnostic Tests:  CXR 11/20 > diffuse pulmonary vascular congestion. Echo 11/20 > EF 45 - 50%, mild to mod MR, trivial AR, mod TR, PAP 97.  Micro Data:  None.  Antimicrobials:  None.   Interim history/subjective:  Getting HD now.  Still dyspneic but improved.  HR improved.  Objective:  Blood pressure (!) 199/131, pulse 89, temperature 98.2 F (36.8 C), temperature source Oral, resp. rate  (!) 23, height 5' 5"  (1.651 m), weight 83.4 kg, SpO2 91 %.        Intake/Output Summary (Last 24 hours) at 12/23/2017 2032 Last data filed at 12/23/2017 2000 Gross per 24 hour  Intake 883.28 ml  Output 2700 ml  Net -1816.72 ml   Filed Weights   12/23/17 0306 12/23/17 1028 12/23/17 1328  Weight: 85.7 kg 86 kg 83.4 kg    Examination: General: Adult male, in NAD. Neuro: A&O x 3, no deficits. HEENT: [place]/AT. Sclerae anicteric, EOMI. Cardiovascular: RRR, 2/6 SEM.  Lungs: Respirations labored.  Coarse rhonchi bilaterally. Abdomen: BS x 4, soft, NT/ND.  Musculoskeletal: No gross deformities, no edema.  Skin: Intact, warm, no rashes.  Assessment & Plan:   Hypertensive urgency- was on nitro gtt which has since been stopped while undergoing HD with max UF. - Continue HD with max UF. - Continue antihypertensives per cardiology (amlodipine, clonidine, hydralazine, labetalol). - Have added PRN IV hydralazine for further afterload reduction while nitro is being held.  Acute pulmonary edema with acute hypoxic respiratory failure - due to above. - Continue supplemental O2 as needed to maintain SpO2 > 92%. - Continue HD with max UF. - BiPAP PRN for increased work of breathing.  Severe COPD - PFT's from Nov 2015 with FEV1 1.63 (58% pred), ratio 44 (55% pred), FVC 3.66 (105% pred). - Will start budesonide / brovana and DuoNebs during his acute illness and will need transition to  inhalers prior to d/c. - Will need outpatient follow up in our pulmonary clinic.  Hx OSA (not on CPAP). - Will need outpatient sleep study / CPAP titration (especially in light of severe PAH).  ESRD on HD MWF. - Per nephrology.  Sinus bradycardia - resolved. - Monitor clinically. - Per cardiology.  sCHF - echo from 11/20 with EF 45 - 50%. - Cardiology following, planning for [facility] once pt is stabilized.  Anemia of chronic disease. - Transfuse for Hgb < 7.  Hx BPH. - Continue flomax.  Hx anxiety. -  Continue hydroxyzine PRN.   Best Practice:  Diet: Renal. Pain/Anxiety/Delirium protocol (if indicated): N/A. VAP protocol (if indicated): N/A. DVT prophylaxis: SCD's / heparin. GI prophylaxis: N/A. Glucose control: N/A. Mobility: Bedrest. Code Status: Full. Family Communication: None available. Disposition: ICU.  Labs   CBC: Recent Labs  Lab 12/22/17 1409 12/22/17 1418 12/23/17 0108  WBC 5.9  --  6.7  NEUTROABS 4.2  --   --   HGB 8.9* 10.2* 8.2*  HCT 29.5* 30.0* 26.1*  MCV 91.0  --  88.5  PLT 171  --  784   Basic Metabolic Panel: Recent Labs  Lab 12/22/17 1409 12/22/17 1418 12/23/17 0108  NA 141 139 139  K 3.9 3.6 3.6  CL 94* 91* 93*  CO2 31  --  31  GLUCOSE 89 91 99  BUN 18 20 22*  CREATININE 10.52* 10.50* 11.54*  CALCIUM 10.1  --  9.9  MG 2.2  --   --   PHOS  --   --  4.6   GFR: Estimated Creatinine Clearance: 7.4 mL/min (A) (by C-G formula based on SCr of 11.54 mg/dL (H)). Recent Labs  Lab 12/22/17 1409 12/23/17 0108  WBC 5.9 6.7   Liver Function Tests: Recent Labs  Lab 12/22/17 1409 12/23/17 0108  AST 23  --   ALT 12  --   ALKPHOS 40  --   BILITOT 1.2  --   PROT 7.2  --   ALBUMIN 3.5 3.2*   No results for input(s): LIPASE, AMYLASE in the last 168 hours. No results for input(s): AMMONIA in the last 168 hours. ABG    Component Value Date/Time   PHART 7.491 (H) 07/09/2013 0029   PCO2ART 41.2 07/09/2013 0029   PO2ART 62.0 (L) 07/09/2013 0029   HCO3 31.5 (H) 07/09/2013 0029   TCO2 39 (H) 12/22/2017 1418   O2SAT 93.0 07/09/2013 0029    Coagulation Profile: Recent Labs  Lab 12/22/17 1428  INR 1.05   Cardiac Enzymes: Recent Labs  Lab 12/22/17 2050 12/23/17 0108 12/23/17 0532  TROPONINI 0.04* 0.04* 0.03*   HbA1C: Hgb A1c MFr Bld  Date/Time Value Ref Range Status  02/15/2013 10:46 AM 4.2 <5.7 % Final    Comment:                                                                           According to the ADA Clinical Practice  Recommendations for 2011, when HbA1c is used as a screening test:     >=6.5%   Diagnostic of Diabetes Mellitus            (if abnormal result is confirmed)   5.7-6.4%  Increased risk of developing Diabetes Mellitus   References:Diagnosis and Classification of Diabetes Mellitus,Diabetes WCBJ,6283,15(VVOHY 1):S62-S69 and Standards of Medical Care in         Diabetes - 2011,Diabetes WVPX,1062,69 (Suppl 1):S11-S61.     CBG: Recent Labs  Lab 12/22/17 1421  GLUCAP 86    Review of Systems:   All negative; except for those that are bolded, which indicate positives.  Constitutional: weight loss, weight gain, night sweats, fevers, chills, fatigue, weakness.  HEENT: headaches, sore throat, sneezing, nasal congestion, post nasal drip, difficulty swallowing, tooth/dental problems, visual complaints, visual changes, ear aches. Neuro: difficulty with speech, weakness, numbness, ataxia. CV:  chest pain, orthopnea, PND, swelling in lower extremities, dizziness, palpitations, syncope.  Resp: cough, hemoptysis, dyspnea, wheezing. GI: heartburn, indigestion, abdominal pain, nausea, vomiting, diarrhea, constipation, change in bowel habits, loss of appetite, hematemesis, melena, hematochezia.  GU: dysuria, change in color of urine, urgency or frequency, flank pain, hematuria. MSK: joint pain or swelling, decreased range of motion. Psych: change in mood or affect, depression, anxiety, suicidal ideations, homicidal ideations. Skin: rash, itching, bruising.   Past medical history  He,  has a past medical history of Anemia, Anxiety, Arthritis, AV fistula occlusion ([place]) (10/2015), Cirrhosis, nonalcoholic (HCC), COPD (chronic obstructive pulmonary disease) ([place]), Depression, ESRD on hemodialysis ([place]), GERD (gastroesophageal reflux disease), Hypertension, Pneumonia (03/2017), Renal insufficiency, Secondary hyperparathyroidism ([place]), Sepsis ([place]) (02/2013), Shortness of breath, and Sleep apnea.   Surgical  History    Past Surgical History:  Procedure Laterality Date  . AV FISTULA PLACEMENT  2009   Left lower arm AVF  . AV FISTULA PLACEMENT Right 02/22/2013   Procedure:  CREATION  OF BRACHIAL CEPHALIC FISTULA RIGHT ARM;  Surgeon: Elam Dutch, MD;  Location: [place];  Service: Vascular;  Laterality: Right;  . AV FISTULA PLACEMENT Left 08/10/2014   Procedure: BASILIC VEIN TRANSPOSITION  ARTERIOVENOUS (AV) FISTULA CREATION LEFT UPPER ARM;  Surgeon: Mal Misty, MD;  Location: [place];  Service: Vascular;  Laterality: Left;  . COLONOSCOPY    . ESOPHAGOGASTRODUODENOSCOPY (EGD) WITH PROPOFOL N/A 04/12/2013   Procedure: ESOPHAGOGASTRODUODENOSCOPY (EGD) WITH PROPOFOL;  Surgeon: Arta Silence, MD;  Location: [facility];  Service: Endoscopy;  Laterality: N/A;  . INSERTION OF DIALYSIS CATHETER N/A 12/23/2012   Procedure: INSERTION OF DIALYSIS CATHETER; ULTRASOUND GUIDED;  Surgeon: Angelia Mould, MD;  Location: [facility] OR;  Service: Vascular;  Laterality: N/A;  . INSERTION OF DIALYSIS CATHETER  10/22/2015   Right IJ non-tunneled HD catheter  . IR GENERIC HISTORICAL  10/22/2015   IR US GUIDE VASC ACCESS RIGHT 10/22/2015 [facility]  . IR GENERIC HISTORICAL  10/22/2015   IR FLUORO GUIDE CV LINE RIGHT 10/22/2015 [facility]  . IR GENERIC HISTORICAL  10/23/2015   IR FLUORO GUIDE CV LINE RIGHT 10/23/2015 Marybelle Killings, MD [facility]  . LEFT HEART CATHETERIZATION WITH CORONARY ANGIOGRAM N/A 07/13/2013   Procedure: LEFT HEART CATHETERIZATION WITH CORONARY ANGIOGRAM;  Surgeon: Jettie Booze, MD;  Location: [facility] CATH LAB;  Service: Cardiovascular;  Laterality: N/A;  . LIGATION OF ARTERIOVENOUS  FISTULA Left 12/22/2012   Procedure: LIGATION OF ARTERIOVENOUS  FISTULA;EXCISION OF LARGE ANEURYSMS;;  Surgeon: Elam Dutch, MD;  Location: [facility] OR;  Service: Vascular;  Laterality: Left;     Social History   reports that he quit smoking about 9 months ago. His smoking use included cigarettes. He has a 8.00  pack-year smoking history. He has never used smokeless tobacco. He reports that he drinks alcohol. He reports that  he does not use drugs.   Family history   His family history includes Asthma in his brother; Cerebrovascular Accident in his father; Congestive Heart Failure in his brother; Hypertension in his father and mother.   Allergies Allergies  Allergen Reactions  . Aspirin Other (See Comments)    Made congestion worse     Home meds  Prior to Admission medications   Medication Sig Start Date End Date Taking? Authorizing Provider  acetaminophen (TYLENOL) 650 MG CR tablet Take 1 tablet (650 mg total) by mouth daily as needed for pain. 03/17/17  Yes Mariel Aloe, MD  amLODipine (NORVASC) 10 MG tablet Take 1 tablet (10 mg total) by mouth daily. 07/14/17  Yes Dorothy Spark, MD  cloNIDine (CATAPRES - DOSED IN MG/24 HR) 0.3 mg/24hr Place 0.3 mg onto the skin every Sunday. Saturday.   Yes [provider]  hydrALAZINE (APRESOLINE) 100 MG tablet TAKE 1 TABLET BY MOUTH 3 TIMES DAILY Patient taking differently: Take 100 mg by mouth 3 (three) times daily.  12/11/17  Yes Sherene Sires, DO  hydrOXYzine (ATARAX/VISTARIL) 25 MG tablet Take 1 tablet (25 mg total) by mouth every 8 (eight) hours as needed for itching. Further refills need to come from your PCP. 04/23/17  Yes Dorothy Spark, MD  metoprolol tartrate (LOPRESSOR) 100 MG tablet Take 100 mg by mouth 2 (two) times daily. 04/22/17  Yes [provider]  SENSIPAR 60 MG tablet Take 1 tablet (60 mg total) by mouth every Monday, Wednesday, and Friday with hemodialysis. Patient taking differently: Take 60 mg by mouth every Monday, Wednesday, and Friday with hemodialysis. Liquid they put in his IV at dialysys 03/17/17  Yes Mariel Aloe, MD  sevelamer carbonate (RENVELA) 800 MG tablet Take 3,200-4,800 mg by mouth See admin instructions. Take 4-6 tablets (3200-4800 mg) by mouth one to two times daily with meals   Yes [provider]  tamsulosin (FLOMAX) 0.4 MG CAPS capsule Take 0.4 mg by mouth every other day.  08/01/14  Yes [provider]  LORazepam (ATIVAN) 0.5 MG tablet Take 1 tablet (0.5 mg total) by mouth 2 (two) times daily as needed for anxiety. Patient not taking: Reported on 12/22/2017 09/30/17   Sherene Sires, DO    Critical care time: 35 min.    Montey Hora, [place] [facility] [place] Pager: [phone]  or [phone] 12/23/2017, 8:32 PM

## 2017-12-23 NOTE — Procedures (Signed)
Patient seen and examined on Hemodialysis. QB 400 mL/ min via TDC, UF goal 4L.  Tearful, feeling poorly, scared dialysis "isn't helping him".  Discussed fluid overload and importance of getting all the fluid off.   Will need serial rx likely.  Treatment adjusted as needed.  Madelon Lips MD [facility] pgr [phone] 11:50 AM

## 2017-12-23 NOTE — Progress Notes (Signed)
Family medicine at patient's bedside to evaluate patient. Aware of patient c/o SOB and anxiety. O2 sats 92% on 5L n/c. Continuing to titrate nitro infusion to improve BP. CCM to be consulted.  Joellen Jersey, RN

## 2017-12-23 NOTE — Consult Note (Addendum)
Cardiology Consultation:   Patient ID: Terrall Bley MRN: [id number]; DOB: March 03, 1965  Admit date: 12/22/2017 Date of Consult: 12/23/2017  Primary Care Provider: Sherene Sires, DO Primary Cardiologist: Dr. Meda Coffee    Patient Profile:   Adeeb Konecny Craighead is a 52 y.o. male with a hx of hypertension that subsequently lead to end-stage renal disease, on hemodialysis Monday Wednesday Friday, presume liver cirrhosis, COPD, secondary hyperparathyroidism, sleep apnea not on CPAP who is being seen today for the evaluation of pulmonary hypertension and bradycardia at the request of Dr. Mackie Pai.  Prior history of palpitation with monitor showing frequent PVC.  Last seen by Dr. Meda Coffee 04/2017. He was doing well on cardiac standpoint at that time.  History of Present Illness:   Mr. Heffington presented 11/19 with 3 days history of shortness of breath.  Noted in hypertensive urgency.  He denies any missed dialysis sessions.  His symptoms started suddenly after walking at [facility] on Friday.  He felt dizzy and shortness of breath since then he has continuous worsening of dyspnea.  Intermittently take his medication.  Patient was admitted and had dialysis sessions this morning.  EKG on admission shows sinus rhythm at rate of 84 bpm however repeat EKG this morning showed inferior lateral T wave inversion which was similar to his prior EKG in 2016-personally reviewed.  Telemetry shows sinus rhythm with frequent PACs and intermittent bradycardic to low 30s with pauses as high as 3.08 this afternoon.  He did have one episode of pauses overnight at 3.26.  He is on metoprolol 100 mg twice daily.  Blood pressure continues to be elevated.  No improvement in dyspnea after dialysis session.  Makes little urine in the morning. During my evaluation HR was ranging form 30s- 80s.   Echocardiogram today showed Study Conclusions  - Left ventricle: Septal and inferior basal hypokinesis. The cavity   size was mildly  dilated. Wall thickness was normal. Systolic   function was mildly reduced. The estimated ejection fraction was   in the range of 45% to 50%. Doppler parameters are consistent   with both elevated ventricular end-diastolic filling pressure and   elevated left atrial filling pressure. - Aortic valve: There was trivial regurgitation. - Mitral valve: Moderately calcified annulus. Moderately thickened,   moderately calcified leaflets . There was mild to moderate   regurgitation. - Left atrium: The atrium was moderately dilated. - Right atrium: The atrium was mildly dilated. - Atrial septum: No defect or patent foramen ovale was identified. - Tricuspid valve: There was moderate regurgitation. - Pulmonary arteries: PA peak pressure: 97 mm Hg (S). - Impressions: Severe pulmonary hypertension based on TR velocity.  Impressions:  - Severe pulmonary hypertension based on TR velocity.  Past Medical History:  Diagnosis Date  . Anemia   . Anxiety   . Arthritis    GOUT - pt not sure if this is true  . 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 Cirrhosis, nonalcoholic ([place])   . COPD (chronic obstructive pulmonary disease) ([place])   . Depression   . ESRD on hemodialysis [facility])    Started HD Jan 2009.  ESRD was due to HTN.  Dx'd with HTN in hospital 1996 according to pt, they had to keep him so he could get Medicaid to afford the BP medications.  First saw a nephrologist and started HD in the same year 2009.  Gets HD at [facility] on a MWF schedule.  Does not have DM. He had a left RC AVF that never  functioned, a left upper arm AVF that worked for about 5 years and as of June  . GERD (gastroesophageal reflux disease)   . Hypertension   . Pneumonia 03/2017  . Renal insufficiency   . Secondary hyperparathyroidism ([place])   . Sepsis ([place]) 02/2013   from AVF , treated with Vancomycin.  Marland Kitchen Shortness of breath    With exertion  . Sleep apnea    no  longer using cpap    Past Surgical History:  Procedure  Laterality Date  . AV FISTULA PLACEMENT  2009   Left lower arm AVF  . AV FISTULA PLACEMENT Right 02/22/2013   Procedure:  CREATION  OF BRACHIAL CEPHALIC FISTULA RIGHT ARM;  Surgeon: Elam Dutch, MD;  Location: [place];  Service: Vascular;  Laterality: Right;  . AV FISTULA PLACEMENT Left 08/10/2014   Procedure: BASILIC VEIN TRANSPOSITION  ARTERIOVENOUS (AV) FISTULA CREATION LEFT UPPER ARM;  Surgeon: Mal Misty, MD;  Location: [place];  Service: Vascular;  Laterality: Left;  . COLONOSCOPY    . ESOPHAGOGASTRODUODENOSCOPY (EGD) WITH PROPOFOL N/A 04/12/2013   Procedure: ESOPHAGOGASTRODUODENOSCOPY (EGD) WITH PROPOFOL;  Surgeon: Arta Silence, MD;  Location: [facility];  Service: Endoscopy;  Laterality: N/A;  . INSERTION OF DIALYSIS CATHETER N/A 12/23/2012   Procedure: INSERTION OF DIALYSIS CATHETER; ULTRASOUND GUIDED;  Surgeon: Angelia Mould, MD;  Location: [facility] OR;  Service: Vascular;  Laterality: N/A;  . INSERTION OF DIALYSIS CATHETER  10/22/2015   Right IJ non-tunneled HD catheter  . IR GENERIC HISTORICAL  10/22/2015   IR US GUIDE VASC ACCESS RIGHT 10/22/2015 [facility]  . IR GENERIC HISTORICAL  10/22/2015   IR FLUORO GUIDE CV LINE RIGHT 10/22/2015 [facility]  . IR GENERIC HISTORICAL  10/23/2015   IR FLUORO GUIDE CV LINE RIGHT 10/23/2015 Marybelle Killings, MD [facility]  . LEFT HEART CATHETERIZATION WITH CORONARY ANGIOGRAM N/A 07/13/2013   Procedure: LEFT HEART CATHETERIZATION WITH CORONARY ANGIOGRAM;  Surgeon: Jettie Booze, MD;  Location: [facility] CATH LAB;  Service: Cardiovascular;  Laterality: N/A;  . LIGATION OF ARTERIOVENOUS  FISTULA Left 12/22/2012   Procedure: LIGATION OF ARTERIOVENOUS  FISTULA;EXCISION OF LARGE ANEURYSMS;;  Surgeon: Elam Dutch, MD;  Location: [facility] OR;  Service: Vascular;  Laterality: Left;     Inpatient Medications: Scheduled Meds: . amLODipine  10 mg Oral Daily  . calcitRIOL  2 mcg Oral Q Wed-HD  . [START ON 12/27/2017] cloNIDine  0.3 mg Transdermal Q  Sun  . darbepoetin (ARANESP) injection - DIALYSIS  150 mcg Intravenous Q Wed-HD  . heparin      . heparin  5,000 Units Subcutaneous Q8H  . hydrALAZINE  100 mg Oral TID  . lidocaine  1 patch Transdermal Q24H  . mouth rinse  15 mL Mouth Rinse BID  . metoprolol tartrate  100 mg Oral BID  . polyethylene glycol  17 g Oral Daily  . sevelamer carbonate  3,200-4,800 mg Oral BID WC  . tamsulosin  0.4 mg Oral QODAY   Continuous Infusions:  PRN Meds: acetaminophen, labetalol, simethicone  Allergies:    Allergies  Allergen Reactions  . Aspirin Other (See Comments)    Made congestion worse    Social History:   Social History   Socioeconomic History  . Marital status: Single    Spouse name: Not on file  . Number of children: 0  . Years of education: 12th  . Highest education level: Not on file  Occupational History  . Occupation: n/a  Social Needs  .  Financial resource strain: Not on file  . Food insecurity:    Worry: Not on file    Inability: Not on file  . Transportation needs:    Medical: Not on file    Non-medical: Not on file  Tobacco Use  . Smoking status: Former Smoker    Packs/day: 0.50    Years: 16.00    Pack years: 8.00    Types: Cigarettes    Last attempt to quit: 03/03/2017    Years since quitting: 0.8  . Smokeless tobacco: Never Used  Substance and Sexual Activity  . Alcohol use: Yes    Alcohol/week: 0.0 standard drinks    Comment: occassional  . Drug use: No  . Sexual activity: Not on file  Lifestyle  . Physical activity:    Days per week: Not on file    Minutes per session: Not on file  . Stress: Not on file  Relationships  . Social connections:    Talks on phone: Not on file    Gets together: Not on file    Attends religious service: Not on file    Active member of club or organization: Not on file    Attends meetings of clubs or organizations: Not on file    Relationship status: Not on file  . Intimate partner violence:    Fear of current or  ex partner: Not on file    Emotionally abused: Not on file    Physically abused: Not on file    Forced sexual activity: Not on file  Other Topics Concern  . Not on file  Social History Narrative  . Not on file    Family History:   Family History  Problem Relation Age of Onset  . Hypertension Mother   . Cerebrovascular Accident Father   . Hypertension Father   . Congestive Heart Failure Brother   . Asthma Brother      ROS:  Please see the history of present illness.  All other ROS reviewed and negative.     Physical Exam/Data:   Vitals:   12/23/17 1230 12/23/17 1300 12/23/17 1328 12/23/17 1534  BP: (!) 170/120 (!) 180/104 (!) 177/104 (!) 186/96  Pulse: 72 70 74 81  Resp:   16 12  Temp:   98.1 F (36.7 C) 98.2 F (36.8 C)  TempSrc:   Oral Oral  SpO2: 100% 100% 95% 97%  Weight:   83.4 kg   Height:        Intake/Output Summary (Last 24 hours) at 12/23/2017 1638 Last data filed at 12/23/2017 1328 Gross per 24 hour  Intake 508.5 ml  Output 2700 ml  Net -2191.5 ml   Filed Weights   12/23/17 0306 12/23/17 1028 12/23/17 1328  Weight: 85.7 kg 86 kg 83.4 kg   Body mass index is 30.6 kg/m.  General:  Well nourished, well developed, in no acute distress HEENT: normal Lymph: no adenopathy Neck: no JVD Endocrine:  No thryomegaly Vascular: No carotid bruits; FA pulses 2+ bilaterally without bruits  Cardiac:  normal S1, S2; RRR; 2/6 systolic murmur  Lungs:  clear to auscultation bilaterally, no wheezing, rhonchi or rales  Abd: soft, nontender, no hepatomegaly  Ext: Trace edema bilaterally  Musculoskeletal:  No deformities, BUE and BLE strength normal and equal Skin: warm and dry  Neuro:  CNs 2-12 intact, no focal abnormalities noted Psych:  Normal affect    Relevant CV Studies: As above  Laboratory Data:  Chemistry Recent Labs  Lab 12/22/17 1409 12/22/17  1418 12/23/17 0108  NA 141 139 139  K 3.9 3.6 3.6  CL 94* 91* 93*  CO2 31  --  31  GLUCOSE 89 91  99  BUN 18 20 22*  CREATININE 10.52* 10.50* 11.54*  CALCIUM 10.1  --  9.9  GFRNONAA 5*  --  4*  GFRAA 6*  --  5*  ANIONGAP 16*  --  15    Recent Labs  Lab 12/22/17 1409 12/23/17 0108  PROT 7.2  --   ALBUMIN 3.5 3.2*  AST 23  --   ALT 12  --   ALKPHOS 40  --   BILITOT 1.2  --    Hematology Recent Labs  Lab 12/22/17 1409 12/22/17 1418 12/23/17 0108  WBC 5.9  --  6.7  RBC 3.24*  --  2.95*  HGB 8.9* 10.2* 8.2*  HCT 29.5* 30.0* 26.1*  MCV 91.0  --  88.5  MCH 27.5  --  27.8  MCHC 30.2  --  31.4  RDW 21.1*  --  20.8*  PLT 171  --  174   Cardiac Enzymes Recent Labs  Lab 12/22/17 2050 12/23/17 0108 12/23/17 0532  TROPONINI 0.04* 0.04* 0.03*    Recent Labs  Lab 12/22/17 1416  TROPIPOC 0.04    BNP Recent Labs  Lab 12/22/17 1409  BNP >4,500.0*    DDimer No results for input(s): DDIMER in the last 168 hours.  Radiology/Studies:  Dg Abd 1 View  Result Date: 12/23/2017 CLINICAL DATA:  Upper abdominal pain tonight EXAM: ABDOMEN - 1 VIEW COMPARISON:  CT abdomen and pelvis 03/08/2017 FINDINGS: Scattered gas and stool throughout the colon. No small or large bowel distention. No radiopaque stones. Visualized bones and soft tissue contours appear intact. Vascular calcifications. Calcified phleboliths in the pelvis. IMPRESSION: Nonobstructive bowel gas pattern. Electronically Signed   By: Lucienne Capers M.D.   On: 12/23/2017 01:32   Dg Chest Portable 1 View  Result Date: 12/22/2017 CLINICAL DATA:  Shortness of breath, productive cough, and vomiting since yesterday after dialysis. Current smoker, diabetes. EXAM: PORTABLE CHEST 1 VIEW COMPARISON:  PA and lateral chest x-ray of September 11, 2017 FINDINGS: The lungs are well-expanded. The interstitial markings are increased. The pulmonary vascularity is engorged. The cardiac silhouette is enlarged. There is no pleural effusion. There is calcification in the wall of the thoracic aorta. The dialysis catheter tip projects just  above the cavoatrial junction. IMPRESSION: CHF with mild interstitial edema. Thoracic aortic atherosclerosis. Electronically Signed   By: David  Martinique M.D.   On: 12/22/2017 14:26    Assessment and Plan:   1.  Sinus bradycardia -Telemetry shows sinus rhythm with frequent PVCs and bradycardia with significant pause.  Heart rate ranging from low 30s to 80s.  Questionable vagal component due to severe dyspnea 3.08 second pause this afternoon.  Will stop metoprolol 100 mg twice daily.  Monitor on closely.  Likely restart tomorrow at lower dose of 25 mg twice daily.  Bedside pressure.  Did had a prior history of tachycardia.  Medical history noted for sleep apnea however not using.  He will need outpatient sleep evaluation.  2.  Mild cardiomyopathy with severe pulmonary hypertension -LV function of 45 to 50% which is down from 60 to 65% in 2014.  There are regional wall motion abnormalities   RVEF is normal    He has worsening shortness of breath since Friday.  Volume mostly managed by dialysis.  He will need left and right heart cath when he  is able to lay flat.  He will need an additional dialysis session.  ]\  3.  Hypertensive urgency -As above  Some is volume dependent    Would add NTG back to regimen   With bradycardia hold metoprolol   He appears to have gottent dose tonight   Keep on clonidine for now.  Continue hydralazine and amllodipine   4.  End-stage renal disease on dialysis -As above  Pt had dialysis run today but it was not completed      For questions or updates, please contact [employer] Please consult [email or web address] for contact info under     Signed, Leanor Kail, PA  12/23/2017 4:38 PM   Pt seen and examined   I agree with findings as noted above by B Bhagat  I have amended this note to reflect my findings  Pt is a 52 yo with ESRD, Severe HTN, sleep apnea, COPD, noncompliance   Admtted with SOB x 3 days.  Admits to missing some meds  Today underwent dialysis but per  report session cut short because of cramping  We are asked to see re bradycardia  On exam, pt is anxious   Sitting in chair   COughing with intermitt tachypnea   (associated with bradycardia) Neck  JVP is increases=d Lungs with wheezes, rales Cardiac RRR   S1, S2  No signif murmurs    Abd is supple   Ext with Tr edema  EKG with SR with  dynamic T wave changes    Tele as noted above     I have review echo   There is mild LV enlargment   LV is thickened.  There is mild LV dysfunction with hypokinesis in the basal inferior, distal anterior / anterolateral  Walls (difficult study)   RVEF is normal   PAP est at over 80 mm Hg  Recomm as noted above  I would tx to ICU   Pt needs more volume removed    Would start NTG IV to help decrease preload   Help with breathing and BP  Hold b blocker for now.   Keep on other meds   I have discussed with the renal service as well as teaching service  Once volume improved and he can lay fairly flat would recomm R and L heart cath   Dorris Carnes

## 2017-12-23 NOTE — Progress Notes (Signed)
FPTS Interim Progress Note:   S: Paged by RN for increasing abdominal pain. Evaluated patient at bedside. Patient endorses sudden onset abdominal pain that has further progressed since starting around a few hours ago. The pain is intermittent, coming in waves for a minute or so and goes away on its own, 9/10. States it feels like a "pressure". Initially started in the LLQ, now also including the epigastric region. Denies any nausea, vomiting. Had a bowel movement in the last day, normal. He often has abdominal pain related to flatus and had some flatus about an hour ago that improved the pain mildly, however feels this is more severe than usual.   O: Blood pressure (!) 198/119, pulse 79, temperature 98.8 F (37.1 C), temperature source Oral, resp. rate 15, height 5' 5"  (1.651 m), weight 85.7 kg, SpO2 96 %.  Gen: Alert, intermittent in distress  Cardiac: Regular rate  Lungs: Chest symmetrical, no increased WOB, on RA satting well Abdomen: Soft, non-tender, non-distended, normoactive BS. Able to point to general region of pain in LLQ and epigastrium. No rebound or guarding.  Ext: warm, dry, palpable pulses distally  Psych: anxious, intermittently in distress due to pain, however able to be calmed down   A/p:   Abdominal pain: Differential is vast, retained flatus vs constipation vs ileus/partial SBO, vs ischemia. Pain likely related to flatus, similar to his previous episodes of flatus/bloating however subjectively more severe. Underlying anxiety may be contributing to increased perception of abdominal pain while already admitted in the hospital for additional concerns, as he is able to calmed during "episodes" and tolerates well. Could consider partial SBO (intussusception?) given history of waxing and waning episodes of severe abdominal pain, however seems unlikely with a normal abdominal exam and stable vitals. Mesenteric ischemic would be more likely in the setting of hypotension.  -Simethicone  tablet for flatus, started on miralax  -KUB STAT, F/u results  -Kpad and lidocaine patch for abdomen (ESRD avoiding NSAIDs, consider adding opioid medication following KUB if abdominal pain remains)  -Will monitor closely   Patriciaann Clan, DO  Family Medicine PGY-1

## 2017-12-23 NOTE — Progress Notes (Addendum)
I was called by cardiology re: hypertensive emergency and bradycardia.  S/p HD today, 2.7L off, signed off early despite needing emergent dialysis.  Hypoxic, bradycardic episodes, extremely hypertensive, coughing frothy pink secretions.  Nitro gtt back on.  Will need another emergent session tonight.  Max UF.  Orders written.   Serial dialysis until euvolemia has been achieved.  Madelon Lips MD [facility] pgr [phone]

## 2017-12-23 NOTE — Consult Note (Signed)
[facility] Renal Consultation Note  Indication for Consultation:  Management of ESRD/hemodialysis; anemia, hypertension/volume and secondary hyperparathyroidism  HPI: James Wall is a 52 y.o. male with ESRD sec HTN ( HD start 2009) ( OP HD MWF [facility]) , Noncompliance with op HD txs ( signs off early , refused lower edw recent op txs X2 ) , generalized anxiety disorder, asthma, and BPH now admitted with HTN  Urgency bp 200/120  / SOB  With cxr showing =CHF with mild interstitial edema.Noted attending op hd and singed off early 11/18 and 11/15 early.   Currently in Room siting in bedside chair watching TV. Refusing [place] O2 "makes my nose burn"  O2 sat 92 %  bp 176/109 .reports running out of some  bp meds .        Past Medical History:  Diagnosis Date  . Anemia   . Anxiety   . Arthritis    GOUT - pt not sure if this is true  . AV fistula occlusion ([place]) 10/2015  . 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 COPD (chronic obstructive pulmonary disease) ([place])   . Depression   . ESRD on hemodialysis [facility])    Started HD Jan 2009.  ESRD was due to HTN.  Dx'd with HTN in hospital 1996 according to pt, they had to keep him so he could get Medicaid to afford the BP medications.  First saw a nephrologist and started HD in the same year 2009.  Gets HD at [facility] on a MWF schedule.  Does not have DM. He had a left RC AVF that never functioned, a left upper arm AVF that worked for about 5 years and as of June  . GERD (gastroesophageal reflux disease)   . Hypertension   . Pneumonia 03/2017  . Renal insufficiency   . Secondary hyperparathyroidism ([place])   . Sepsis ([place]) 02/2013   from AVF , treated with Vancomycin.  Marland Kitchen Shortness of breath    With exertion  . Sleep apnea    no  longer using cpap    Past Surgical History:  Procedure Laterality Date  . AV FISTULA PLACEMENT  2009   Left lower arm AVF  . AV FISTULA PLACEMENT Right 02/22/2013   Procedure:  CREATION  OF BRACHIAL  CEPHALIC FISTULA RIGHT ARM;  Surgeon: Elam Dutch, MD;  Location: [place];  Service: Vascular;  Laterality: Right;  . AV FISTULA PLACEMENT Left 08/10/2014   Procedure: BASILIC VEIN TRANSPOSITION  ARTERIOVENOUS (AV) FISTULA CREATION LEFT UPPER ARM;  Surgeon: Mal Misty, MD;  Location: [place];  Service: Vascular;  Laterality: Left;  . COLONOSCOPY    . ESOPHAGOGASTRODUODENOSCOPY (EGD) WITH PROPOFOL N/A 04/12/2013   Procedure: ESOPHAGOGASTRODUODENOSCOPY (EGD) WITH PROPOFOL;  Surgeon: Arta Silence, MD;  Location: [facility];  Service: Endoscopy;  Laterality: N/A;  . INSERTION OF DIALYSIS CATHETER N/A 12/23/2012   Procedure: INSERTION OF DIALYSIS CATHETER; ULTRASOUND GUIDED;  Surgeon: Angelia Mould, MD;  Location: [facility] OR;  Service: Vascular;  Laterality: N/A;  . INSERTION OF DIALYSIS CATHETER  10/22/2015   Right IJ non-tunneled HD catheter  . IR GENERIC HISTORICAL  10/22/2015   IR US GUIDE VASC ACCESS RIGHT 10/22/2015 [facility]  . IR GENERIC HISTORICAL  10/22/2015   IR FLUORO GUIDE CV LINE RIGHT 10/22/2015 [facility]  . IR GENERIC HISTORICAL  10/23/2015   IR FLUORO GUIDE CV LINE RIGHT 10/23/2015 Marybelle Killings, MD [facility]  . LEFT HEART CATHETERIZATION WITH CORONARY ANGIOGRAM N/A 07/13/2013  Procedure: LEFT HEART CATHETERIZATION WITH CORONARY ANGIOGRAM;  Surgeon: Jettie Booze, MD;  Location: [facility] CATH LAB;  Service: Cardiovascular;  Laterality: N/A;  . LIGATION OF ARTERIOVENOUS  FISTULA Left 12/22/2012   Procedure: LIGATION OF ARTERIOVENOUS  FISTULA;EXCISION OF LARGE ANEURYSMS;;  Surgeon: Elam Dutch, MD;  Location: [facility] OR;  Service: Vascular;  Laterality: Left;      Family History  Problem Relation Age of Onset  . Hypertension Mother   . Cerebrovascular Accident Father   . Hypertension Father   . Congestive Heart Failure Brother   . Asthma Brother       reports that he quit smoking about 9 months ago. His smoking use included cigarettes. He has a 8.00 pack-year  smoking history. He has never used smokeless tobacco. He reports that he drinks alcohol. He reports that he does not use drugs.   Allergies  Allergen Reactions  . Aspirin Other (See Comments)    Made congestion worse    Prior to Admission medications   Medication Sig Start Date End Date Taking? Authorizing Provider  acetaminophen (TYLENOL) 650 MG CR tablet Take 1 tablet (650 mg total) by mouth daily as needed for pain. 03/17/17  Yes Mariel Aloe, MD  amLODipine (NORVASC) 10 MG tablet Take 1 tablet (10 mg total) by mouth daily. 07/14/17  Yes Dorothy Spark, MD  cloNIDine (CATAPRES - DOSED IN MG/24 HR) 0.3 mg/24hr Place 0.3 mg onto the skin every Sunday. Saturday.   Yes [provider]  hydrALAZINE (APRESOLINE) 100 MG tablet TAKE 1 TABLET BY MOUTH 3 TIMES DAILY Patient taking differently: Take 100 mg by mouth 3 (three) times daily.  12/11/17  Yes Sherene Sires, DO  hydrOXYzine (ATARAX/VISTARIL) 25 MG tablet Take 1 tablet (25 mg total) by mouth every 8 (eight) hours as needed for itching. Further refills need to come from your PCP. 04/23/17  Yes Dorothy Spark, MD  metoprolol tartrate (LOPRESSOR) 100 MG tablet Take 100 mg by mouth 2 (two) times daily. 04/22/17  Yes [provider]  SENSIPAR 60 MG tablet Take 1 tablet (60 mg total) by mouth every Monday, Wednesday, and Friday with hemodialysis. Patient taking differently: Take 60 mg by mouth every Monday, Wednesday, and Friday with hemodialysis. Liquid they put in his IV at dialysys 03/17/17  Yes Mariel Aloe, MD  sevelamer carbonate (RENVELA) 800 MG tablet Take 3,200-4,800 mg by mouth See admin instructions. Take 4-6 tablets (3200-4800 mg) by mouth one to two times daily with meals   Yes [provider]  tamsulosin (FLOMAX) 0.4 MG CAPS capsule Take 0.4 mg by mouth every other day.  08/01/14  Yes [provider]  LORazepam (ATIVAN) 0.5 MG tablet Take 1 tablet (0.5 mg total) by mouth 2 (two) times daily as  needed for anxiety. Patient not taking: Reported on 12/22/2017 09/30/17   Sherene Sires, DO     Anti-infectives (From admission, onward)   None      Results for orders placed or performed during the hospital encounter of 12/22/17 (from the past 48 hour(s))  Comprehensive metabolic panel     Status: Abnormal   Collection Time: 12/22/17  2:09 PM  Result Value Ref Range   Sodium 141 135 - 145 mmol/L   Potassium 3.9 3.5 - 5.1 mmol/L   Chloride 94 (L) 98 - 111 mmol/L   CO2 31 22 - 32 mmol/L   Glucose, Bld 89 70 - 99 mg/dL   BUN 18 6 - 20 mg/dL  Creatinine, Ser 10.52 (H) 0.61 - 1.24 mg/dL   Calcium 10.1 8.9 - 10.3 mg/dL   Total Protein 7.2 6.5 - 8.1 g/dL   Albumin 3.5 3.5 - 5.0 g/dL   AST 23 15 - 41 U/L   ALT 12 0 - 44 U/L   Alkaline Phosphatase 40 38 - 126 U/L   Total Bilirubin 1.2 0.3 - 1.2 mg/dL   GFR calc non Af Amer 5 (L) >60 mL/min   GFR calc Af Amer 6 (L) >60 mL/min    Comment: (NOTE) The eGFR has been calculated using the CKD EPI equation. This calculation has not been validated in all clinical situations. eGFR's persistently <60 mL/min signify possible Chronic Kidney Disease.    Anion gap 16 (H) 5 - 15    Comment: Performed at [facility], [place] [address] [place], [place] [address]  Magnesium     Status: None   Collection Time: 12/22/17  2:09 PM  Result Value Ref Range   Magnesium 2.2 1.7 - 2.4 mg/dL    Comment: Performed at [facility], [place] [address] [place], [place] [address]  Brain natriuretic peptide (order if patient c/o SOB ONLY)     Status: Abnormal   Collection Time: 12/22/17  2:09 PM  Result Value Ref Range   B Natriuretic Peptide >4,500.0 (H) 0.0 - 100.0 pg/mL    Comment: Performed at [facility] [address], [place], [place] [address]  CBC with Differential/Platelet     Status: Abnormal   Collection Time: 12/22/17  2:09 PM  Result Value Ref Range   WBC 5.9 4.0 - 10.5 K/uL   RBC 3.24 (L) 4.22 - 5.81 MIL/uL   Hemoglobin 8.9 (L)  13.0 - 17.0 g/dL   HCT 29.5 (L) 39.0 - 52.0 %   MCV 91.0 80.0 - 100.0 fL   MCH 27.5 26.0 - 34.0 pg   MCHC 30.2 30.0 - 36.0 g/dL   RDW 21.1 (H) 11.5 - 15.5 %   Platelets 171 150 - 400 K/uL   nRBC 0.0 0.0 - 0.2 %   Neutrophils Relative % 72 %   Neutro Abs 4.2 1.7 - 7.7 K/uL   Lymphocytes Relative 20 %   Lymphs Abs 1.2 0.7 - 4.0 K/uL   Monocytes Relative 6 %   Monocytes Absolute 0.4 0.1 - 1.0 K/uL   Eosinophils Relative 2 %   Eosinophils Absolute 0.1 0.0 - 0.5 K/uL   Basophils Relative 0 %   Basophils Absolute 0.0 0.0 - 0.1 K/uL   nRBC 0 0 /100 WBC   Abs Immature Granulocytes 0.00 0.00 - 0.07 K/uL   Polychromasia PRESENT     Comment: Performed at [facility], [place] [address] [place], [place] [address]  I-Stat Troponin, ED (not at [facility])     Status: None   Collection Time: 12/22/17  2:16 PM  Result Value Ref Range   Troponin i, poc 0.04 0.00 - 0.08 ng/mL   Comment 3            Comment: Due to the release kinetics of cTnI, a negative result within the first hours of the onset of symptoms does not rule out myocardial infarction with certainty. If myocardial infarction is still suspected, repeat the test at appropriate intervals.   I-Stat Chem 8, ED     Status: Abnormal   Collection Time: 12/22/17  2:18 PM  Result Value Ref Range   Sodium 139 135 - 145 mmol/L  Potassium 3.6 3.5 - 5.1 mmol/L   Chloride 91 (L) 98 - 111 mmol/L   BUN 20 6 - 20 mg/dL   Creatinine, Ser 10.50 (H) 0.61 - 1.24 mg/dL   Glucose, Bld 91 70 - 99 mg/dL   Calcium, Ion 1.11 (L) 1.15 - 1.40 mmol/L   TCO2 39 (H) 22 - 32 mmol/L   Hemoglobin 10.2 (L) 13.0 - 17.0 g/dL   HCT 30.0 (L) 39.0 - 52.0 %  CBG monitoring, ED     Status: None   Collection Time: 12/22/17  2:21 PM  Result Value Ref Range   Glucose-Capillary 86 70 - 99 mg/dL   Comment 1 Notify RN    Comment 2 Document in Chart   Protime-INR     Status: None   Collection Time: 12/22/17  2:28 PM  Result Value Ref Range   Prothrombin Time 13.6 11.4  - 15.2 seconds   INR 1.05     Comment: Performed at [facility], [place] [address], [place], [place] [address]  Troponin I - Now Then Q6H     Status: Abnormal   Collection Time: 12/22/17  8:50 PM  Result Value Ref Range   Troponin I 0.04 (HH) <0.03 ng/mL    Comment: CRITICAL RESULT CALLED TO, READ BACK BY AND VERIFIED WITH: PEGRAM,O RN 12/22/2017 2[address] Performed at [facility], [place] [address], [place], [place] [address]   CBC     Status: Abnormal   Collection Time: 12/23/17  1:08 AM  Result Value Ref Range   WBC 6.7 4.0 - 10.5 K/uL   RBC 2.95 (L) 4.22 - 5.81 MIL/uL   Hemoglobin 8.2 (L) 13.0 - 17.0 g/dL   HCT 26.1 (L) 39.0 - 52.0 %   MCV 88.5 80.0 - 100.0 fL   MCH 27.8 26.0 - 34.0 pg   MCHC 31.4 30.0 - 36.0 g/dL   RDW 20.8 (H) 11.5 - 15.5 %   Platelets 174 150 - 400 K/uL   nRBC 0.0 0.0 - 0.2 %    Comment: Performed at [facility], [place] [address] [place], [place] [address]  Renal function panel     Status: Abnormal   Collection Time: 12/23/17  1:08 AM  Result Value Ref Range   Sodium 139 135 - 145 mmol/L   Potassium 3.6 3.5 - 5.1 mmol/L   Chloride 93 (L) 98 - 111 mmol/L   CO2 31 22 - 32 mmol/L   Glucose, Bld 99 70 - 99 mg/dL   BUN 22 (H) 6 - 20 mg/dL   Creatinine, Ser 11.54 (H) 0.61 - 1.24 mg/dL   Calcium 9.9 8.9 - 10.3 mg/dL   Phosphorus 4.6 2.5 - 4.6 mg/dL   Albumin 3.2 (L) 3.5 - 5.0 g/dL   GFR calc non Af Amer 4 (L) >60 mL/min   GFR calc Af Amer 5 (L) >60 mL/min    Comment: (NOTE) The eGFR has been calculated using the CKD EPI equation. This calculation has not been validated in all clinical situations. eGFR's persistently <60 mL/min signify possible Chronic Kidney Disease.    Anion gap 15 5 - 15    Comment: Performed at [facility] [address], [place], [place] [address]  Troponin I - Now Then Q6H     Status: Abnormal   Collection Time: 12/23/17  1:08 AM  Result Value Ref Range   Troponin I 0.04 (HH) <0.03 ng/mL    Comment:  CRITICAL VALUE NOTED.  VALUE IS CONSISTENT WITH PREVIOUSLY REPORTED AND CALLED VALUE. Performed at [facility], [place] [address], [place], [place] [address]   Troponin I - Now Then Q6H     Status: Abnormal   Collection Time: 12/23/17  5:32 AM  Result Value Ref Range   Troponin I 0.03 (HH) <0.03 ng/mL    Comment: CRITICAL VALUE NOTED.  VALUE IS CONSISTENT WITH PREVIOUSLY REPORTED AND CALLED VALUE. Performed at [facility], [place] [address], [place], [place] [address]     ROS: denies fevers , chills, reports cough clear/whitish sputum  , nausea and vomiting, denies constipation or diarrhea , reports  some dizziness  But  No falls   Physical Exam: Vitals:   12/23/17 0534 12/23/17 0602  BP: (!) 165/109 (!) 169/114  Pulse: 72 71  Resp: (!) 23 15  Temp:    SpO2: 96% 90%     General: alert Siting  Up in chair  watching TV no apparent distress  HEENT: NV  , some facial edema, not icteric ,MMM  Neck: supple, no jvd  Heart: RRR 1/6 sem , no rub or gallop Lungs: bilat basilar crackles  Abdomen: bss pos ,soft t, nt, nd  Extremities: bipedal edema  Skin: bilat LE dry scaling skin , no open wounds appreciated  Neuro: alert , OX#, moves all extrem  Dialysis Access: R IJ Perm cath   Dialysis Orders: [facility]: [facility]  on [facility] . EDW 87.5 kg HD Bath 2k, 2ca  Time 3hr 30mn  Heparin 4000.  Access R IJ perm cath    (refusing  any other access)  Calcitriol 2.0  mcg po /HD  Mircera 225 (last 12/09/17 , last op Hgb 8.8  11/13)      Assessment/Plan 1. Volume overload - HD today , needs EDW lowered ,I dw with patient again and he appear to understand  2. HTN  Urgency 2/2 vol and bp med noncompliance - uf with hd today nd bp meds   3. ESRD -  Nl MWF  ,hd today 2/2 vol ^  4. Anemia  - ESA today,  5. Metabolic bone disease -  PO vit d on hd and binder with food  6. Abdominal Pain - KUB with stool  Admit team noted Miralax  7. Anxiety disorder - Meds per admit   DErnest Haber PA-C [id number]/20/2019, 8:45 AM

## 2017-12-23 NOTE — Progress Notes (Signed)
3.08 sec pause, Family MD updated. Will continue to monitor.

## 2017-12-23 NOTE — Progress Notes (Signed)
Family medicine progress note  Saw and examined patient at bedside with Dr. Higinio Plan.  Patient appeared to be in mild distress, very fidgety in bed.  CRRT being set up at bedside. He states he is feeling very anxious.  He apparently always leaves at dialysis sessions early due to cramps.  Discussed case with nephrology attending he was at bedside.  Believe patient to be around 8 or 9 kg above dry weight.  Patient satting well on 5 L nasal cannula.  BP is between 357 and 017 systolic while in room, heart rate in 80s.  Mild crackles in left lower lobe, trace to +1 pitting edema bilateral lower extremity.  Believe patient's increased oxygen requirements likely due to volume overload.  Repeat chest x-ray reviewed by me, shows increased pulmonary edema left greater than right from admission x-ray.  Independently confirmed by radiology.  Stressed importance of staying on dialysis as long as he can tolerate to get volume off the patient.  Patient was very honest in saying that he cannot handle pain very well.  Patient related his cramping pain was worse than respiratory distress, and his mild swelling.  Patient did say he would try his best to stay on dialysis longer than usual to get volume off.  ABG still pending.  PCCM called earlier this p.m., follow-up recs and appreciate their help.  Cardiology following for bradycardia and chest pain.  He is on nitro drip.  Appreciate the recs. Will continue to follow patient closely.   Guadalupe Dawn MD PGY-2 Family Medicine Resident

## 2017-12-23 NOTE — Progress Notes (Signed)
  Echocardiogram 2D Echocardiogram has been attempted. Patient not in room.  James Wall James Wall 12/23/2017, 11:11 AM

## 2017-12-23 NOTE — Progress Notes (Signed)
Pacer pads on

## 2017-12-23 NOTE — Progress Notes (Signed)
  Echocardiogram 2D Echocardiogram has been performed.  James Wall G James Wall 12/23/2017, 3:37 PM

## 2017-12-23 NOTE — Progress Notes (Signed)
eLink Physician-Brief Progress Note Patient Name: Kenyetta Fife DOB: [birth year] MRN: [id number]   Date of Service  12/23/2017  HPI/Events of Note  Acute shortness of breath, elevation of blood pressure, desaturation and bradycardia secondary to hypertensive crises/ acute pulmonary edema  eICU Interventions  Nitroglycerin infusion titrated for effect, hydralazine 20 mg iv prn x 2, Bipap, target systolic BP approx 114 mmHg, hemodialysis with ultrafiltration.        Chayil Gantt U Lanesha Azzaro 12/23/2017, 10:00 PM

## 2017-12-24 ENCOUNTER — Encounter (HOSPITAL_COMMUNITY): Payer: Self-pay | Admitting: *Deleted

## 2017-12-24 ENCOUNTER — Inpatient Hospital Stay (HOSPITAL_COMMUNITY): Payer: Medicare Other

## 2017-12-24 ENCOUNTER — Ambulatory Visit: Payer: Medicare Other | Admitting: Family Medicine

## 2017-12-24 DIAGNOSIS — R0602 Shortness of breath: Secondary | ICD-10-CM

## 2017-12-24 DIAGNOSIS — J810 Acute pulmonary edema: Secondary | ICD-10-CM

## 2017-12-24 LAB — CBC
HCT: 28.9 % — ABNORMAL LOW (ref 39.0–52.0)
HEMOGLOBIN: 9 g/dL — AB (ref 13.0–17.0)
MCH: 27.8 pg (ref 26.0–34.0)
MCHC: 31.1 g/dL (ref 30.0–36.0)
MCV: 89.2 fL (ref 80.0–100.0)
Platelets: 181 10*3/uL (ref 150–400)
RBC: 3.24 MIL/uL — ABNORMAL LOW (ref 4.22–5.81)
RDW: 20.2 % — ABNORMAL HIGH (ref 11.5–15.5)
WBC: 8.7 10*3/uL (ref 4.0–10.5)
nRBC: 0 % (ref 0.0–0.2)

## 2017-12-24 LAB — RENAL FUNCTION PANEL
ANION GAP: 9 (ref 5–15)
Albumin: 3.2 g/dL — ABNORMAL LOW (ref 3.5–5.0)
BUN: 7 mg/dL (ref 6–20)
CO2: 30 mmol/L (ref 22–32)
CREATININE: 4.44 mg/dL — AB (ref 0.61–1.24)
Calcium: 9.7 mg/dL (ref 8.9–10.3)
Chloride: 99 mmol/L (ref 98–111)
GFR calc Af Amer: 16 mL/min — ABNORMAL LOW (ref >60)
GFR calc non Af Amer: 14 mL/min — ABNORMAL LOW (ref >60)
GLUCOSE: 91 mg/dL (ref 70–99)
POTASSIUM: 4.1 mmol/L (ref 3.5–5.1)
Phosphorus: 2.7 mg/dL (ref 2.5–4.6)
SODIUM: 138 mmol/L (ref 135–145)

## 2017-12-24 MED ORDER — AMLODIPINE BESYLATE 5 MG PO TABS
7.5000 mg | ORAL_TABLET | Freq: Every day | ORAL | Status: DC
  Administered 2017-12-24: 7.5 mg via ORAL
  Filled 2017-12-24: qty 2

## 2017-12-24 MED ORDER — AMLODIPINE BESYLATE 5 MG PO TABS
2.5000 mg | ORAL_TABLET | Freq: Once | ORAL | Status: AC
  Administered 2017-12-24: 2.5 mg via ORAL
  Filled 2017-12-24: qty 1

## 2017-12-24 MED ORDER — AMLODIPINE BESYLATE 5 MG PO TABS: 5.0000 mg | ORAL_TABLET | Freq: Every day | ORAL | Status: DC

## 2017-12-24 MED ORDER — AMLODIPINE BESYLATE 10 MG PO TABS
10.0000 mg | ORAL_TABLET | Freq: Every day | ORAL | Status: DC
  Administered 2017-12-25: 10 mg via ORAL
  Filled 2017-12-24: qty 1

## 2017-12-24 MED ORDER — CHLORHEXIDINE GLUCONATE CLOTH 2 % EX PADS
6.0000 | MEDICATED_PAD | Freq: Every day | CUTANEOUS | Status: DC
  Administered 2017-12-24: 6 via TOPICAL

## 2017-12-24 MED ORDER — CLONIDINE HCL 0.3 MG/24HR TD PTWK
0.3000 mg | MEDICATED_PATCH | TRANSDERMAL | Status: DC
  Administered 2017-12-24: 0.3 mg via TRANSDERMAL
  Filled 2017-12-24: qty 1

## 2017-12-24 MED ORDER — METOPROLOL TARTRATE 12.5 MG HALF TABLET
12.5000 mg | ORAL_TABLET | Freq: Four times a day (QID) | ORAL | Status: DC
  Administered 2017-12-24 (×2): 12.5 mg via ORAL
  Filled 2017-12-24 (×2): qty 1

## 2017-12-24 MED ORDER — ISOSORBIDE MONONITRATE ER 60 MG PO TB24
90.0000 mg | ORAL_TABLET | Freq: Every day | ORAL | Status: DC
  Administered 2017-12-24 – 2017-12-25 (×2): 90 mg via ORAL
  Filled 2017-12-24 (×2): qty 1

## 2017-12-24 NOTE — Progress Notes (Addendum)
At pt. Bedside with HD equipment with attempts to initiate hemodialysis treatment pt. Is refusing treatment. Pt. States "I am not doing it theres nothing you can say that will change my mind I was dialyzed 2 times yesterday and im not doing it today." Dr. Hollie Salk notified. Critical team notified as well. Pt. Is alert and oriented x4

## 2017-12-24 NOTE — Progress Notes (Signed)
NAME:  James Wall, MRN:  [id number], DOB:  1965/02/24, LOS: 2 ADMISSION DATE:  12/22/2017, CONSULTATION DATE:  12/23/17 REFERRING MD:  Mingo Amber  CHIEF COMPLAINT:  SOB   Brief History   James Wall is a 52 y.o. male who was admitted 11/19 with dyspnea due to acute pulmonary edema in the setting of hypertensive urgency. Transferred to ICU 11/20 for emergent HD session.  History of present illness   James Wall is a 52 y.o. male who has a PMH as outlined below including but not limited to ESRD on HD MWF (see "past medical history").  He presented to [facility] ED  11/19 with dyspnea and hypertensive urgency.  He was admitted by FPTS and started on nitro gtt.  Nephrology ws consulted and started normal treatment 11/20 with plans to lower EDW.  He started his session but had to stop early due to experiencing cramps.  He had a total of 2.7L off during his session.  Later that afternoon, he developed worsening dyspnea and bradycardia.  He was noted to have significant pink colored frothy sputum.  Cardiology was consulted and recommended transfer to ICU.  Nephrology was called back and opted to add a second emergent HD session for volume removal with max UF.  Past Medical History  ESRD on HD MWF, OSA not on CPAP, COPD, HTN, GERD, depression, anxiety.  Significant Hospital Events   11/19 > admit. 11/20 > HD session stopped early due to cramping.  Needed 2nd emergent HD session with max UF. 11/21: Now negative almost 5 L, breathing better but remains hypertensive, adding back clonidine patch.  Unfortunately he is refusing dialysis. Consults:  Nephrology 11/20. Cardiology 11/20. PCCM 11/20.  Procedures:  None.  Significant Diagnostic Tests:  CXR 11/20 > diffuse pulmonary vascular congestion. Echo 11/20 > EF 45 - 50%, mild to mod MR, trivial AR, mod TR, PAP 97.  Micro Data:  None.  Antimicrobials:  None.   Interim history/subjective:  Refusing dialysis  Objective:    Blood pressure (Abnormal) 191/106, pulse 94, temperature 98.6 F (37 C), temperature source Oral, resp. rate 17, height 5' 5"  (1.651 m), weight 81.8 kg, SpO2 97 %.    FiO2 (%):  [50 %-60 %] 50 %   Intake/Output Summary (Last 24 hours) at 12/24/2017 1041 Last data filed at 12/24/2017 1000 Gross per 24 hour  Intake 693.49 ml  Output 6138 ml  Net -5444.51 ml   Filed Weights   12/23/17 1930 12/24/17 0000 12/24/17 0202  Weight: 84.5 kg 81.8 kg 81.8 kg    Examination:  General: 52 year old male patient resting in chair no acute distress HEENT normocephalic atraumatic no jugular venous distention Pulmonary: Clear to auscultation diminished bases Cardiac: Regular rate and rhythm tachycardic Abdomen: Soft nontender no organomegaly Extremities: Warm dry no edema Neuro: Awake oriented no focal deficits    Sinus bradycardia   Assessment & Plan:   Hypertensive urgency Plan Cont antihypertensives Will add back clonidine  May need cardene gtt   Further Volume removal per HS  Acute pulmonary edema with acute hypoxic respiratory failure - due to above. -Now off BIPAP -review of CXR shows bilateral airspace disease .  Personal review of current film shows improved pulmonary edema -O2 requirements: Improved Plan Wean oxygen Mobilize Pulse oximetry Volume removal as able  Severe COPD - PFT's from Nov 2015 with FEV1 1.63 (58% pred), ratio 44 (55% pred), FVC 3.66 (105% pred). Plan Cont brovana and budesonide for now Switch to MDI before dc  Will need pulm f/u at dc   Hx OSA (not on CPAP). Plan Needs OP sleep study (especially w/ h/o PH)  ESRD on HD MWF. Plan Per nephro.    sCHF - echo from 11/20 with EF 45 - 50%. Plan Needs R & L heart cath    Anemia of chronic disease. Plan Transfuse for hgb < 7 And CBC  Hx BPH. Plan flomax  Hx anxiety. Plan Cont PRNs    Best Practice:  Diet: Renal. Pain/Anxiety/Delirium protocol (if indicated): N/A. VAP protocol (if  indicated): N/A. DVT prophylaxis: SCD's / heparin. GI prophylaxis: N/A. Glucose control: N/A. Mobility: Bedrest. Code Status: Full. Family Communication: None available. Disposition: cont ICU monitoring I think he would benefit from goals of care discussion.  He is refusing dialysis, states he is frustrated, he is not sure he wants to continue doing this level of intensive treatment.  I will order palliative care consult.  If anything to at least identify future goals of care in regards to life support.    Critical care time: na   James Wall ACNP-BC [place] Pager # [phone] [place] # [phone] if no answer

## 2017-12-24 NOTE — Progress Notes (Signed)
Progress Note  Patient Name: James Wall Date of Encounter: 12/24/2017  Primary Cardiologist:   Subjective   No CP    Breathing is better   Still with cramping in ankles   "I do not want dialysis today"  Inpatient Medications    Scheduled Meds: . arformoterol  15 mcg Nebulization BID  . budesonide (PULMICORT) nebulizer solution  0.5 mg Nebulization BID  . calcitRIOL  2 mcg Oral Q Wed-HD  . [START ON 12/27/2017] cloNIDine  0.3 mg Transdermal Q Sun  . darbepoetin (ARANESP) injection - DIALYSIS  150 mcg Intravenous Q Wed-HD  . heparin  5,000 Units Subcutaneous Q8H  . hydrALAZINE  100 mg Oral TID  . ipratropium-albuterol  3 mL Nebulization Q6H  . lidocaine  1 patch Transdermal Q24H  . mouth rinse  15 mL Mouth Rinse BID  . polyethylene glycol  17 g Oral Daily  . sevelamer carbonate  3,200-4,800 mg Oral BID WC  . tamsulosin  0.4 mg Oral QODAY   Continuous Infusions: . sodium chloride Stopped (12/23/17 1836)  . nitroGLYCERIN 130 mcg/min (12/24/17 0600)   PRN Meds: sodium chloride, acetaminophen, hydrALAZINE, hydrOXYzine, labetalol, simethicone   Vital Signs    Vitals:   12/24/17 0500 12/24/17 0600 12/24/17 0729 12/24/17 0745  BP: (!) 164/88 (!) 193/108  (!) 209/114  Pulse: 81 93  89  Resp: (!) 25 (!) 29  (!) 34  Temp:   98.6 F (37 C)   TempSrc:   Oral   SpO2: 97% 98%  100%  Weight:      Height:        Intake/Output Summary (Last 24 hours) at 12/24/2017 0825 Last data filed at 12/24/2017 0700 Gross per 24 hour  Intake 559.75 ml  Output 6138 ml  Net -5578.25 ml   Filed Weights   12/23/17 1930 12/24/17 0000 12/24/17 0202  Weight: 84.5 kg 81.8 kg 81.8 kg    Telemetry    SB, SR   Currently SR 80s - Personally Reviewed  ECG      Physical Exam   GEN: No acute distress.  Laying relatively flat in bed   Neck:  JVP is minimally increased  Cardiac: RRR, no murmurs, rubs, or gallops.  Respiratory: Clear to auscultation bilaterally   Rales at R base   . GI: Soft, nontender, non-distended  MS: No edema; No deformity. Neuro:  Nonfocal  Psych: Normal affect   Labs    Chemistry Recent Labs  Lab 12/22/17 1409 12/22/17 1418 12/23/17 0108 12/24/17 0605  NA 141 139 139 138  K 3.9 3.6 3.6 4.1  CL 94* 91* 93* 99  CO2 31  --  31 30  GLUCOSE 89 91 99 91  BUN 18 20 22* 7  CREATININE 10.52* 10.50* 11.54* 4.44*  CALCIUM 10.1  --  9.9 9.7  PROT 7.2  --   --   --   ALBUMIN 3.5  --  3.2* 3.2*  AST 23  --   --   --   ALT 12  --   --   --   ALKPHOS 40  --   --   --   BILITOT 1.2  --   --   --   GFRNONAA 5*  --  4* 14*  GFRAA 6*  --  5* 16*  ANIONGAP 16*  --  15 9     Hematology Recent Labs  Lab 12/22/17 1409 12/22/17 1418 12/23/17 0108 12/24/17 0605  WBC 5.9  --  6.7 8.7  RBC 3.24*  --  2.95* 3.24*  HGB 8.9* 10.2* 8.2* 9.0*  HCT 29.5* 30.0* 26.1* 28.9*  MCV 91.0  --  88.5 89.2  MCH 27.5  --  27.8 27.8  MCHC 30.2  --  31.4 31.1  RDW 21.1*  --  20.8* 20.2*  PLT 171  --  174 181    Cardiac Enzymes Recent Labs  Lab 12/22/17 2050 12/23/17 0108 12/23/17 0532  TROPONINI 0.04* 0.04* 0.03*    Recent Labs  Lab 12/22/17 1416  TROPIPOC 0.04     BNP Recent Labs  Lab 12/22/17 1409  BNP >4,500.0*     DDimer No results for input(s): DDIMER in the last 168 hours.   Radiology    Dg Abd 1 View  Result Date: 12/23/2017 CLINICAL DATA:  Upper abdominal pain tonight EXAM: ABDOMEN - 1 VIEW COMPARISON:  CT abdomen and pelvis 03/08/2017 FINDINGS: Scattered gas and stool throughout the colon. No small or large bowel distention. No radiopaque stones. Visualized bones and soft tissue contours appear intact. Vascular calcifications. Calcified phleboliths in the pelvis. IMPRESSION: Nonobstructive bowel gas pattern. Electronically Signed   By: Lucienne Capers M.D.   On: 12/23/2017 01:32   Dg Chest Port 1 View  Result Date: 12/23/2017 CLINICAL DATA:  Initial evaluation for acute shortness of breath, productive cough. EXAM:  PORTABLE CHEST 1 VIEW COMPARISON:  Prior radiograph from 12/22/2017 FINDINGS: Left-sided dual lumen central venous catheter in place, stable. Defibrillator pads overlie the left chest. Moderate cardiomegaly unchanged. Mediastinal silhouette normal. Aortic atherosclerosis. Lungs mildly hypoinflated. Diffuse pulmonary vascular congestion with bilateral airspace opacities, right greater than left. Findings are slightly worsened from previous. No appreciable pleural effusion, although the left costophrenic angle is incompletely visualized. No pneumothorax. No acute osseous abnormality. IMPRESSION: Cardiomegaly with diffuse pulmonary vascular congestion and bilateral airspace opacities, right greater than left. Findings are slightly worsened from previous. Findings may reflect pulmonary edema and/or multifocal infection. Electronically Signed   By: Jeannine Boga M.D.   On: 12/23/2017 19:44   Dg Chest Portable 1 View  Result Date: 12/22/2017 CLINICAL DATA:  Shortness of breath, productive cough, and vomiting since yesterday after dialysis. Current smoker, diabetes. EXAM: PORTABLE CHEST 1 VIEW COMPARISON:  PA and lateral chest x-ray of September 11, 2017 FINDINGS: The lungs are well-expanded. The interstitial markings are increased. The pulmonary vascularity is engorged. The cardiac silhouette is enlarged. There is no pleural effusion. There is calcification in the wall of the thoracic aorta. The dialysis catheter tip projects just above the cavoatrial junction. IMPRESSION: CHF with mild interstitial edema. Thoracic aortic atherosclerosis. Electronically Signed   By: David  Martinique M.D.   On: 12/22/2017 14:26    Cardiac Studies     Patient Profile     52 y.o. male hx of HTN, ESRD  Admitted with SOB    Assessment & Plan    1  Rhythm   Pt remains in SR   HR is improved from last night    ? Vagal rxn in setting of pulmonary edema  2   CHF  LVEF is mildly drepressed on echo 45 to 50% with regional wall  motion changes  PAP 80s     Would recomm R and L heart cath when volume status improved    Diaslysis after  3  HTN   BP remains high even after extra dialysis   Add amlodipine 7.5 mg daily for today   Increase to 10     4  ESRD   Renal service following .  Possible dialysis later     For questions or updates, please contact [place] Please consult [email or web address] for contact info under        Signed, Dorris Carnes, MD  12/24/2017, 8:25 AM

## 2017-12-24 NOTE — Progress Notes (Addendum)
Events of today noted  Pt comfortable he says    Relatively flat BP 160s/100s    Tele  SR/ST    Occasional slowing with Wenkebach  REcomm:   Switch NTG IV to Imdur 90 Add a very low dose of metoprolol 12.5 qid   Follow HR and BP  Discussed R and L heart caht with pt   Echo with severe pulmonary HTN (may be from volume)   REgional wall motion abnormalities. Would need dialysis after   Pt agreeing with plan  Will see In am how he feels Dorris Carnes

## 2017-12-24 NOTE — Progress Notes (Signed)
James Wall Progress Note   Assessment/ Plan:   Dialysis Orders: Center: [facility]  on MWF . EDW 87.5 kg HD Bath 2k, 2ca  Time 3hr 53mn  Heparin 4000.  Access R IJ perm cath    (refusing  any other access)  Calcitriol 2.0  mcg po /HD  Mircera 225 (last 12/09/17 , last op Hgb 8.8  11/13)      Assessment/Plan 1. Volume overload - serial HD until EDW achieved- had been refusing EDW adjustment as OP, will need markedly lower EDW upon discharge 2. HTN  Urgency 2/2 vol and bp med noncompliance: on nitro gtt, had some bradycardia and so BB held.  Cardiology c/s appreciate assistance 3. Bradycardia-  vagal/ hypoxic contributing, likely has some CAD given WMA on TTE, cardiology recommending R and L heart cath  4. ESRD -  Nl MWF  serial HD until euvolemia achieved 5. Anemia  - ESA as appropriate 6. Metabolic bone disease -  PO vit d on hd and binder with food  7. Abdominal Pain - KUB with stool  Admit team noted Miralax  8. Anxiety disorder - Meds per admit  9. Dispo: in ICU  Subjective:    Had emergent HD again last night and is now feeling better.  Pressures still high, needs nitro gtt, more HD.  Discussed repeat HD today, he wants to defer to Friday but emphasized importance of serial dialysis to avoid another life-threatening situation.   Objective:   BP (!) 190/109 (BP Location: Right Arm)   Pulse 92   Temp 98.6 F (37 C) (Oral)   Resp (!) 23   Ht 5' 5"  (1.651 m)   Wt 81.8 kg   SpO2 95%   BMI 30.01 kg/m   Physical Exam:  General: sitting on edge of bed, eating breakfast HEENT:+ facial edema, wearing O2 Neck: + JVD to angle of mandible Heart: RRR 1/6 sem , no rub or gallop Lungs: increased WOB (but improved from yesterday), bilateral crackles Abdomen: bss pos ,soft t, nt, nd  Extremities: 1+ LE edema, improved Skin: bilat LE dry scaling skin , no open wounds appreciated  Neuro: AAO x 3, anxiety improved Dialysis Access: R IHollace Kinniercath   Labs: BMET Recent  Labs  Lab 12/22/17 1409 12/22/17 1418 12/23/17 0108 12/24/17 0605  NA 141 139 139 138  K 3.9 3.6 3.6 4.1  CL 94* 91* 93* 99  CO2 31  --  31 30  GLUCOSE 89 91 99 91  BUN 18 20 22* 7  CREATININE 10.52* 10.50* 11.54* 4.44*  CALCIUM 10.1  --  9.9 9.7  PHOS  --   --  4.6 2.7   CBC Recent Labs  Lab 12/22/17 1409 12/22/17 1418 12/23/17 0108 12/24/17 0605  WBC 5.9  --  6.7 8.7  NEUTROABS 4.2  --   --   --   HGB 8.9* 10.2* 8.2* 9.0*  HCT 29.5* 30.0* 26.1* 28.9*  MCV 91.0  --  88.5 89.2  PLT 171  --  174 181    @IMGRELPRIORS @ Medications:    . arformoterol  15 mcg Nebulization BID  . budesonide (PULMICORT) nebulizer solution  0.5 mg Nebulization BID  . calcitRIOL  2 mcg Oral Q Wed-HD  . Chlorhexidine Gluconate Cloth  6 each Topical Daily  . [START ON 12/27/2017] cloNIDine  0.3 mg Transdermal Q Sun  . darbepoetin (ARANESP) injection - DIALYSIS  150 mcg Intravenous Q Wed-HD  . heparin  5,000 Units Subcutaneous Q8H  .  hydrALAZINE  100 mg Oral TID  . ipratropium-albuterol  3 mL Nebulization Q6H  . lidocaine  1 patch Transdermal Q24H  . mouth rinse  15 mL Mouth Rinse BID  . polyethylene glycol  17 g Oral Daily  . sevelamer carbonate  3,200-4,800 mg Oral BID WC  . tamsulosin  0.4 mg Oral QODAY     Madelon Lips MD [facility] pgr [phone] 12/24/2017, 8:53 AM

## 2017-12-25 LAB — RENAL FUNCTION PANEL
Albumin: 2.7 g/dL — ABNORMAL LOW (ref 3.5–5.0)
Anion gap: 9 (ref 5–15)
BUN: 19 mg/dL (ref 6–20)
CHLORIDE: 99 mmol/L (ref 98–111)
CO2: 28 mmol/L (ref 22–32)
CREATININE: 8.35 mg/dL — AB (ref 0.61–1.24)
Calcium: 9.9 mg/dL (ref 8.9–10.3)
GFR calc Af Amer: 7 mL/min — ABNORMAL LOW (ref >60)
GFR calc non Af Amer: 6 mL/min — ABNORMAL LOW (ref >60)
Glucose, Bld: 94 mg/dL (ref 70–99)
PHOSPHORUS: 3.5 mg/dL (ref 2.5–4.6)
Potassium: 3.9 mmol/L (ref 3.5–5.1)
Sodium: 136 mmol/L (ref 135–145)

## 2017-12-25 LAB — CBC
HCT: 29.3 % — ABNORMAL LOW (ref 39.0–52.0)
Hemoglobin: 8.6 g/dL — ABNORMAL LOW (ref 13.0–17.0)
MCH: 26.8 pg (ref 26.0–34.0)
MCHC: 29.4 g/dL — ABNORMAL LOW (ref 30.0–36.0)
MCV: 91.3 fL (ref 80.0–100.0)
PLATELETS: 206 10*3/uL (ref 150–400)
RBC: 3.21 MIL/uL — ABNORMAL LOW (ref 4.22–5.81)
RDW: 20.1 % — AB (ref 11.5–15.5)
WBC: 4.6 10*3/uL (ref 4.0–10.5)
nRBC: 0 % (ref 0.0–0.2)

## 2017-12-25 LAB — HEPATITIS B SURFACE ANTIGEN: Hepatitis B Surface Ag: NEGATIVE

## 2017-12-25 MED ORDER — METOPROLOL TARTRATE 25 MG PO TABS
25.0000 mg | ORAL_TABLET | Freq: Two times a day (BID) | ORAL | Status: DC
  Administered 2017-12-25: 25 mg via ORAL
  Filled 2017-12-25: qty 1

## 2017-12-25 MED ORDER — HEPARIN SODIUM (PORCINE) 1000 UNIT/ML IJ SOLN
INTRAMUSCULAR | Status: AC
  Administered 2017-12-25: 3800 [IU]
  Filled 2017-12-25: qty 4

## 2017-12-25 MED ORDER — LIDOCAINE 5 % EX PTCH
1.0000 | MEDICATED_PATCH | CUTANEOUS | Status: DC
  Administered 2017-12-25: 1 via TRANSDERMAL
  Filled 2017-12-25 (×2): qty 1

## 2017-12-25 MED ORDER — HEPARIN SODIUM (PORCINE) 1000 UNIT/ML IJ SOLN
INTRAMUSCULAR | Status: AC
  Administered 2017-12-25: 1000 [IU]
  Filled 2017-12-25: qty 4

## 2017-12-25 NOTE — Progress Notes (Signed)
Patient seen earlier today Now in dialysis.  Volume and blood pressures have improved WIll sign off since patient does not want to consider testing Please call with questions.  James Wall

## 2017-12-25 NOTE — Procedures (Signed)
Patient seen and examined on Hemodialysis. QB 400 mL/ min via TDC, UF goal 2.5L  Asking to sign off early- discussed importance of adherence to HD prescription to avoid recurrence of life-threatening volume overload.  Treatment adjusted as needed.  Madelon Lips MD [facility] pgr [phone] 4:36 PM

## 2017-12-25 NOTE — Progress Notes (Signed)
Progress Note  Patient Name: James Wall Date of Encounter: 12/25/2017  Primary Cardiologist:   Subjective   Breathing is OK    NO CP "You told me I would be asleep for cath.   I don't want it done.  I will have dialysis"  Inpatient Medications    Scheduled Meds: . amLODipine  10 mg Oral Daily  . arformoterol  15 mcg Nebulization BID  . budesonide (PULMICORT) nebulizer solution  0.5 mg Nebulization BID  . calcitRIOL  2 mcg Oral Q Wed-HD  . Chlorhexidine Gluconate Cloth  6 each Topical Daily  . cloNIDine  0.3 mg Transdermal Weekly  . darbepoetin (ARANESP) injection - DIALYSIS  150 mcg Intravenous Q Wed-HD  . heparin  5,000 Units Subcutaneous Q8H  . hydrALAZINE  100 mg Oral TID  . ipratropium-albuterol  3 mL Nebulization Q6H  . isosorbide mononitrate  90 mg Oral Daily  . lidocaine  1 patch Transdermal Q24H  . mouth rinse  15 mL Mouth Rinse BID  . metoprolol tartrate  12.5 mg Oral QID  . polyethylene glycol  17 g Oral Daily  . sevelamer carbonate  3,200-4,800 mg Oral BID WC  . tamsulosin  0.4 mg Oral QODAY   Continuous Infusions: . sodium chloride Stopped (12/23/17 1836)   PRN Meds: sodium chloride, acetaminophen, hydrALAZINE, hydrOXYzine, labetalol, simethicone   Vital Signs    Vitals:   12/25/17 0600 12/25/17 0700 12/25/17 0724 12/25/17 0820  BP:  (!) 142/95    Pulse: (!) 106 76    Resp: 16 (!) 24    Temp:   98.3 F (36.8 C)   TempSrc:   Oral   SpO2: 99% (!) 89%  95%  Weight: 78.3 kg     Height: 5' 5"  (1.651 m)       Intake/Output Summary (Last 24 hours) at 12/25/2017 0829 Last data filed at 12/25/2017 0700 Gross per 24 hour  Intake 945.58 ml  Output -  Net 945.58 ml   Filed Weights   12/24/17 0000 12/24/17 0202 12/25/17 0600  Weight: 81.8 kg 81.8 kg 78.3 kg    Telemetry    - Personally Reviewed  ECG      Physical Exam   GEN: No acute distress.   Neck:  JVP is not elevated while sitting  Cardiac: RRR, no murmurs, rubs, or  gallops.  Respiratory: Clear to auscultation bilaterally   GI: Soft, nontender, non-distended  MS: No edema; No deformity. Neuro:  Nonfocal  Psych: Normal affect   Labs    Chemistry Recent Labs  Lab 12/22/17 1409 12/22/17 1418 12/23/17 0108 12/24/17 0605  NA 141 139 139 138  K 3.9 3.6 3.6 4.1  CL 94* 91* 93* 99  CO2 31  --  31 30  GLUCOSE 89 91 99 91  BUN 18 20 22* 7  CREATININE 10.52* 10.50* 11.54* 4.44*  CALCIUM 10.1  --  9.9 9.7  PROT 7.2  --   --   --   ALBUMIN 3.5  --  3.2* 3.2*  AST 23  --   --   --   ALT 12  --   --   --   ALKPHOS 40  --   --   --   BILITOT 1.2  --   --   --   GFRNONAA 5*  --  4* 14*  GFRAA 6*  --  5* 16*  ANIONGAP 16*  --  15 9     Hematology Recent Labs  Lab 12/22/17 1409 12/22/17 1418 12/23/17 0108 12/24/17 0605  WBC 5.9  --  6.7 8.7  RBC 3.24*  --  2.95* 3.24*  HGB 8.9* 10.2* 8.2* 9.0*  HCT 29.5* 30.0* 26.1* 28.9*  MCV 91.0  --  88.5 89.2  MCH 27.5  --  27.8 27.8  MCHC 30.2  --  31.4 31.1  RDW 21.1*  --  20.8* 20.2*  PLT 171  --  174 181    Cardiac Enzymes Recent Labs  Lab 12/22/17 2050 12/23/17 0108 12/23/17 0532  TROPONINI 0.04* 0.04* 0.03*    Recent Labs  Lab 12/22/17 1416  TROPIPOC 0.04     BNP Recent Labs  Lab 12/22/17 1409  BNP >4,500.0*     DDimer No results for input(s): DDIMER in the last 168 hours.   Radiology    Dg Chest Port 1 View  Result Date: 12/24/2017 CLINICAL DATA:  Shortness of breath. EXAM: PORTABLE CHEST 1 VIEW COMPARISON:  12/23/2017. FINDINGS: Right IJ sheath noted with tip at cavoatrial junction. Cardiomegaly. Interim partial clearing of bilateral pulmonary infiltrates consistent partial clearing of pulmonary edema. No pleural effusion or pneumothorax. IMPRESSION: 1.  Right IJ sheath in stable position. 2. Cardiomegaly. Interim partial clearing of bilateral pulmonary infiltrates consistent with partial clearing of pulmonary edema. Electronically Signed   By: Marcello Moores  Register   On:  12/24/2017 11:32   Dg Chest Port 1 View  Result Date: 12/23/2017 CLINICAL DATA:  Initial evaluation for acute shortness of breath, productive cough. EXAM: PORTABLE CHEST 1 VIEW COMPARISON:  Prior radiograph from 12/22/2017 FINDINGS: Left-sided dual lumen central venous catheter in place, stable. Defibrillator pads overlie the left chest. Moderate cardiomegaly unchanged. Mediastinal silhouette normal. Aortic atherosclerosis. Lungs mildly hypoinflated. Diffuse pulmonary vascular congestion with bilateral airspace opacities, right greater than left. Findings are slightly worsened from previous. No appreciable pleural effusion, although the left costophrenic angle is incompletely visualized. No pneumothorax. No acute osseous abnormality. IMPRESSION: Cardiomegaly with diffuse pulmonary vascular congestion and bilateral airspace opacities, right greater than left. Findings are slightly worsened from previous. Findings may reflect pulmonary edema and/or multifocal infection. Electronically Signed   By: Jeannine Boga M.D.   On: 12/23/2017 19:44    Cardiac Studies     Patient Profile     52 y.o. male hx of HTN, ESRD  Admitted with SOB    Assessment & Plan    1  Rhythm  Remains ins SR / ST   No signif pauses   Follow   Tolerating low dose b blocker  2   CHF  Volume status is signifcantly improved after dialysis   Sched for another run today    Echo this admit showed LVEF 45 to 50% with regiona lwall motion abnormalities    I would recomm L heart cath to define coronary anatomy  ALso R heart cath with pulmonary HTN (Pressures prob lower after dialysis)    Pt  Is adament I told him he would be put to sleep to do procedure. (which I did not)   He does not want to do     Would continue medical Rx of BP and  Fluid control  Plan on conservative treatment with medicine and dialysis    3  HTN  BP is improved with current meds and having had dialysis   Follow   4  ESRD   Sched for dialysis today   5   HL  Check lipdis   SHould be on empiric statins  For questions or updates, please contact [place] Please consult [email or web address] for contact info under        Signed, James Carnes, MD  12/25/2017, 8:29 AM

## 2017-12-25 NOTE — Progress Notes (Signed)
NAME:  James Wall, MRN:  [id number], DOB:  07-31-1965, LOS: 3 ADMISSION DATE:  12/22/2017, CONSULTATION DATE:  12/23/17 REFERRING MD:  Mingo Amber  CHIEF COMPLAINT:  SOB   Brief History   James Wall is a 52 y.o. male who was admitted 11/19 with dyspnea due to acute pulmonary edema in the setting of hypertensive urgency. Transferred to ICU 11/20 for emergent HD session.  Past Medical History  ESRD on HD MWF, OSA not on CPAP, COPD, HTN, GERD, depression, anxiety.  Significant Hospital Events   11/19 > admit. 11/20 > HD session stopped early due to cramping.  Needed 2nd emergent HD session with max UF. 11/21> Now negative almost 5 L, breathing better but remains hypertensive, adding back clonidine patch.  Unfortunately he is refusing dialysis 11/22 > HD Consults:  Nephrology 11/20. Cardiology 11/20. PCCM 11/20.  Procedures:  None.  Significant Diagnostic Tests:  CXR 11/20 > diffuse pulmonary vascular congestion. Echo 11/20 > EF 45 - 50%, mild to mod MR, trivial AR, mod TR, PAP 97.  Micro Data:  None.  Antimicrobials:  None.   Interim history/subjective:  Refused heart cath today.  Consented to HD. Currently on HD, starting to get cramps.  Objective:  Blood pressure (!) 93/56, pulse (!) 57, temperature 98.2 F (36.8 C), temperature source Oral, resp. rate 16, height 5' 5"  (1.651 m), weight 78.8 kg, SpO2 98 %.    Intake/Output Summary (Last 24 hours) at 12/25/2017 1725 Last data filed at 12/25/2017 0700 Gross per 24 hour  Intake 434.5 ml  Output -  Net 434.5 ml   Filed Weights   12/24/17 0202 12/25/17 0600 12/25/17 1314  Weight: 81.8 kg 78.3 kg 78.8 kg    Examination: General:  Adult male resting in bed on HD in NAD HEENT: MM pink/moist, no JVD Neuro: Alert, oriented, non focal CV: RR, no murmurs, R IJ perm cath PULM: even/non-labored, lungs bilaterally clear GI: soft, non-tender, bs active Extremities: warm/dry, no edema  Skin: no rashes      Sinus bradycardia   Assessment & Plan:   Hypertensive urgency- resolved Plan Cont antihypertensives Further Volume removal per HS  Acute pulmonary edema with acute hypoxic respiratory failure - due to above. - resolved, now on room air Plan Mobilize Pulmonary hygiene  Volume removal as able  Severe COPD - PFT's from Nov 2015 with FEV1 1.63 (58% pred), ratio 44 (55% pred), FVC 3.66 (105% pred). Plan Cont brovana and budesonide Switch to MDI before dc Will need pulm f/u at dc   Hx OSA (not on CPAP). Plan Needs OP sleep study (especially w/ h/o PH)  ESRD on HD MWF. Plan Per nephro.    sCHF - echo from 11/20 with EF 45 - 50%. Plan Needs R & L heart cath - refused 11/22  Per Cards  Anemia of chronic disease. Plan Transfuse for hgb < 7 And CBC  Hx BPH. Plan flomax  Hx anxiety. Plan Cont PRNs    Best Practice:  Diet: Renal. Pain/Anxiety/Delirium protocol (if indicated): N/A. VAP protocol (if indicated): N/A. DVT prophylaxis: SCD's / heparin. GI prophylaxis: N/A. Glucose control: N/A. Mobility: Bedrest. Code Status: Full. Family Communication: None available. Disposition: tx to tele floor and to FPTS as of 11/23.  Palliative Care still to see.   Nothing further to add.  PCCM will sign off.  Please do not hesitate to call us back if we can be of any further assistance.    Kennieth Rad, AGACNP-BC [facility] Pgr: [phone] or if no answer [phone] 12/25/2017, 5:32 PM

## 2017-12-25 NOTE — Progress Notes (Signed)
No charge note:   Palliative consult received for GOC. Attempted to see patient today but he was off unit in HD. PMT will attempt to see at a later time. May be a delayed visit due to high volume of referrals.  Mariana Kaufman, AGNP-C Palliative Medicine  Please call Palliative Medicine team phone with any questions [phone]. For individual providers please see AMION.

## 2017-12-25 NOTE — Progress Notes (Signed)
[facility] Progress Note   Assessment/ Plan:   Dialysis Orders: Center: east  on MWF . EDW 87.5 kg HD Bath 2k, 2ca  Time 3hr 60mn  Heparin 4000.  Access R IJ perm cath    (refusing  any other access)  Calcitriol 2.0  mcg po /HD  Mircera 225 (last 12/09/17 , last op Hgb 8.8  11/13)      Assessment/Plan 1. Volume overload - serial HD until EDW achieved- had been refusing EDW adjustment as OP, will need markedly lower EDW upon discharge 2. HTN  Urgency 2/2 vol and bp med noncompliance: off nitro gtt, converted in Imdur, had some bradycardia and so BB held but now has restarted and is tolerating.  Cardiology c/s appreciate assistance 3. Bradycardia-  vagal/ hypoxic contributing, likely has some CAD given WMA on TTE, cardiology recommending R and L heart cath but pt refusing. 4. ESRD -  Nl MWF  serial HD until euvolemia achieved 5. Anemia  - ESA as appropriate 6. Metabolic bone disease -  PO vit d on hd and binder with food  7. Abdominal Pain - KUB with stool  Admit team noted Miralax  8. Anxiety disorder - Meds per admit  9. Dispo: in ICU  Subjective:    Refused HD yesterday, refused heart cath this AM, willing to go to HD today.  Off nitro gtt.     Objective:   BP (!) 154/80   Pulse 69   Temp 98.3 F (36.8 C) (Oral)   Resp 15   Ht 5' 5"  (1.651 m)   Wt 78.3 kg   SpO2 96%   BMI 28.73 kg/m   Physical Exam:  General: lying in bed, sleeping, NAD HEENT:+ facial edema but improved, no longer wearing O2 Neck: JVD not visible Heart: RRR 1/6 sem , no rub or gallop Lungs: normal WOB, faint coarse breath sounds Abdomen: bss pos ,soft t, nt, nd  Extremities: 1+ LE edema, improved Skin: bilat LE dry scaling skin , no open wounds appreciated  Neuro: AAO x 3 when awakened Dialysis Access: R IHollace Kinniercath   Labs: BMET Recent Labs  Lab 12/22/17 1409 12/22/17 1418 12/23/17 0108 12/24/17 0605  NA 141 139 139 138  K 3.9 3.6 3.6 4.1  CL 94* 91* 93* 99  CO2 31   --  31 30  GLUCOSE 89 91 99 91  BUN 18 20 22* 7  CREATININE 10.52* 10.50* 11.54* 4.44*  CALCIUM 10.1  --  9.9 9.7  PHOS  --   --  4.6 2.7   CBC Recent Labs  Lab 12/22/17 1409 12/22/17 1418 12/23/17 0108 12/24/17 0605  WBC 5.9  --  6.7 8.7  NEUTROABS 4.2  --   --   --   HGB 8.9* 10.2* 8.2* 9.0*  HCT 29.5* 30.0* 26.1* 28.9*  MCV 91.0  --  88.5 89.2  PLT 171  --  174 181    @IMGRELPRIORS @ Medications:    . amLODipine  10 mg Oral Daily  . arformoterol  15 mcg Nebulization BID  . budesonide (PULMICORT) nebulizer solution  0.5 mg Nebulization BID  . calcitRIOL  2 mcg Oral Q Wed-HD  . Chlorhexidine Gluconate Cloth  6 each Topical Daily  . cloNIDine  0.3 mg Transdermal Weekly  . darbepoetin (ARANESP) injection - DIALYSIS  150 mcg Intravenous Q Wed-HD  . heparin  5,000 Units Subcutaneous Q8H  . hydrALAZINE  100 mg Oral TID  . ipratropium-albuterol  3 mL Nebulization  Q6H  . isosorbide mononitrate  90 mg Oral Daily  . lidocaine  1 patch Transdermal Q24H  . mouth rinse  15 mL Mouth Rinse BID  . metoprolol tartrate  25 mg Oral BID  . polyethylene glycol  17 g Oral Daily  . sevelamer carbonate  3,200-4,800 mg Oral BID WC  . tamsulosin  0.4 mg Oral QODAY     Madelon Lips MD [facility] pgr [phone] 12/25/2017, 10:14 AM

## 2017-12-27 DIAGNOSIS — J158 Pneumonia due to other specified bacteria: Secondary | ICD-10-CM | POA: Diagnosis not present

## 2017-12-27 DIAGNOSIS — Z992 Dependence on renal dialysis: Secondary | ICD-10-CM | POA: Diagnosis not present

## 2017-12-27 DIAGNOSIS — D509 Iron deficiency anemia, unspecified: Secondary | ICD-10-CM | POA: Diagnosis not present

## 2017-12-27 DIAGNOSIS — N2581 Secondary hyperparathyroidism of renal origin: Secondary | ICD-10-CM | POA: Diagnosis not present

## 2017-12-27 DIAGNOSIS — D631 Anemia in chronic kidney disease: Secondary | ICD-10-CM | POA: Diagnosis not present

## 2017-12-27 DIAGNOSIS — N186 End stage renal disease: Secondary | ICD-10-CM | POA: Diagnosis not present

## 2017-12-29 DIAGNOSIS — Z992 Dependence on renal dialysis: Secondary | ICD-10-CM | POA: Diagnosis not present

## 2017-12-29 DIAGNOSIS — J158 Pneumonia due to other specified bacteria: Secondary | ICD-10-CM | POA: Diagnosis not present

## 2017-12-29 DIAGNOSIS — N2581 Secondary hyperparathyroidism of renal origin: Secondary | ICD-10-CM | POA: Diagnosis not present

## 2017-12-29 DIAGNOSIS — D631 Anemia in chronic kidney disease: Secondary | ICD-10-CM | POA: Diagnosis not present

## 2017-12-29 DIAGNOSIS — D509 Iron deficiency anemia, unspecified: Secondary | ICD-10-CM | POA: Diagnosis not present

## 2017-12-29 DIAGNOSIS — N186 End stage renal disease: Secondary | ICD-10-CM | POA: Diagnosis not present

## 2018-01-01 DIAGNOSIS — N186 End stage renal disease: Secondary | ICD-10-CM | POA: Diagnosis not present

## 2018-01-01 DIAGNOSIS — N2581 Secondary hyperparathyroidism of renal origin: Secondary | ICD-10-CM | POA: Diagnosis not present

## 2018-01-03 DIAGNOSIS — N186 End stage renal disease: Secondary | ICD-10-CM | POA: Diagnosis not present

## 2018-01-03 DIAGNOSIS — Z992 Dependence on renal dialysis: Secondary | ICD-10-CM | POA: Diagnosis not present

## 2018-01-03 DIAGNOSIS — I12 Hypertensive chronic kidney disease with 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: Secondary | ICD-10-CM | POA: Diagnosis not present

## 2018-01-04 DIAGNOSIS — Z992 Dependence on renal dialysis: Secondary | ICD-10-CM | POA: Diagnosis not present

## 2018-01-04 DIAGNOSIS — N2581 Secondary hyperparathyroidism of renal origin: Secondary | ICD-10-CM | POA: Diagnosis not present

## 2018-01-04 DIAGNOSIS — J158 Pneumonia due to other specified bacteria: Secondary | ICD-10-CM | POA: Diagnosis not present

## 2018-01-04 DIAGNOSIS — D509 Iron deficiency anemia, unspecified: Secondary | ICD-10-CM | POA: Diagnosis not present

## 2018-01-04 DIAGNOSIS — N186 End stage renal disease: Secondary | ICD-10-CM | POA: Diagnosis not present

## 2018-01-04 DIAGNOSIS — D631 Anemia in chronic kidney disease: Secondary | ICD-10-CM | POA: Diagnosis not present

## 2018-01-05 ENCOUNTER — Telehealth: Payer: Self-pay | Admitting: Family Medicine

## 2018-01-05 NOTE — Telephone Encounter (Signed)
Spoke with pt about their overdue colonoscopy. They said they were going to wait until after the holidays so I recommended that they contact [facility] in advance to schedule the day that will work best for them in the future. Pt was having difficulties hearing me so they may not have been fully receptive of the information. -[place]

## 2018-01-06 DIAGNOSIS — D631 Anemia in chronic kidney disease: Secondary | ICD-10-CM | POA: Diagnosis not present

## 2018-01-06 DIAGNOSIS — J158 Pneumonia due to other specified bacteria: Secondary | ICD-10-CM | POA: Diagnosis not present

## 2018-01-06 DIAGNOSIS — Z992 Dependence on renal dialysis: Secondary | ICD-10-CM | POA: Diagnosis not present

## 2018-01-06 DIAGNOSIS — N186 End stage renal disease: Secondary | ICD-10-CM | POA: Diagnosis not present

## 2018-01-06 DIAGNOSIS — N2581 Secondary hyperparathyroidism of renal origin: Secondary | ICD-10-CM | POA: Diagnosis not present

## 2018-01-06 DIAGNOSIS — D509 Iron deficiency anemia, unspecified: Secondary | ICD-10-CM | POA: Diagnosis not present

## 2018-01-08 DIAGNOSIS — N186 End stage renal disease: Secondary | ICD-10-CM | POA: Diagnosis not present

## 2018-01-08 DIAGNOSIS — D631 Anemia in chronic kidney disease: Secondary | ICD-10-CM | POA: Diagnosis not present

## 2018-01-08 DIAGNOSIS — J158 Pneumonia due to other specified bacteria: Secondary | ICD-10-CM | POA: Diagnosis not present

## 2018-01-08 DIAGNOSIS — Z992 Dependence on renal dialysis: Secondary | ICD-10-CM | POA: Diagnosis not present

## 2018-01-08 DIAGNOSIS — D509 Iron deficiency anemia, unspecified: Secondary | ICD-10-CM | POA: Diagnosis not present

## 2018-01-08 DIAGNOSIS — N2581 Secondary hyperparathyroidism of renal origin: Secondary | ICD-10-CM | POA: Diagnosis not present

## 2018-01-11 ENCOUNTER — Other Ambulatory Visit: Payer: Self-pay | Admitting: Family Medicine

## 2018-01-11 DIAGNOSIS — N186 End stage renal disease: Secondary | ICD-10-CM | POA: Diagnosis not present

## 2018-01-11 DIAGNOSIS — Z299 Encounter for prophylactic measures, unspecified: Secondary | ICD-10-CM

## 2018-01-11 DIAGNOSIS — N2581 Secondary hyperparathyroidism of renal origin: Secondary | ICD-10-CM | POA: Diagnosis not present

## 2018-01-11 DIAGNOSIS — J158 Pneumonia due to other specified bacteria: Secondary | ICD-10-CM | POA: Diagnosis not present

## 2018-01-11 DIAGNOSIS — D509 Iron deficiency anemia, unspecified: Secondary | ICD-10-CM | POA: Diagnosis not present

## 2018-01-11 DIAGNOSIS — Z992 Dependence on renal dialysis: Secondary | ICD-10-CM | POA: Diagnosis not present

## 2018-01-11 DIAGNOSIS — D631 Anemia in chronic kidney disease: Secondary | ICD-10-CM | POA: Diagnosis not present

## 2018-01-11 NOTE — Telephone Encounter (Signed)
Error

## 2018-01-12 NOTE — Telephone Encounter (Signed)
Pt contacted and informed to pick up his rx before apt this afternoon.

## 2018-01-12 NOTE — Telephone Encounter (Signed)
Pt lost his initial prescription- pts dentist apt is at 1230pm today. Pt stated the dentist said he probably wouldn't need it.

## 2018-01-13 DIAGNOSIS — N2581 Secondary hyperparathyroidism of renal origin: Secondary | ICD-10-CM | POA: Diagnosis not present

## 2018-01-13 DIAGNOSIS — Z992 Dependence on renal dialysis: Secondary | ICD-10-CM | POA: Diagnosis not present

## 2018-01-13 DIAGNOSIS — N186 End stage renal disease: Secondary | ICD-10-CM | POA: Diagnosis not present

## 2018-01-13 DIAGNOSIS — D509 Iron deficiency anemia, unspecified: Secondary | ICD-10-CM | POA: Diagnosis not present

## 2018-01-13 DIAGNOSIS — D631 Anemia in chronic kidney disease: Secondary | ICD-10-CM | POA: Diagnosis not present

## 2018-01-13 DIAGNOSIS — J158 Pneumonia due to other specified bacteria: Secondary | ICD-10-CM | POA: Diagnosis not present

## 2018-01-15 DIAGNOSIS — J158 Pneumonia due to other specified bacteria: Secondary | ICD-10-CM | POA: Diagnosis not present

## 2018-01-15 DIAGNOSIS — N186 End stage renal disease: Secondary | ICD-10-CM | POA: Diagnosis not present

## 2018-01-15 DIAGNOSIS — Z992 Dependence on renal dialysis: Secondary | ICD-10-CM | POA: Diagnosis not present

## 2018-01-15 DIAGNOSIS — D509 Iron deficiency anemia, unspecified: Secondary | ICD-10-CM | POA: Diagnosis not present

## 2018-01-15 DIAGNOSIS — D631 Anemia in chronic kidney disease: Secondary | ICD-10-CM | POA: Diagnosis not present

## 2018-01-15 DIAGNOSIS — N2581 Secondary hyperparathyroidism of renal origin: Secondary | ICD-10-CM | POA: Diagnosis not present

## 2018-01-18 DIAGNOSIS — N2581 Secondary hyperparathyroidism of renal origin: Secondary | ICD-10-CM | POA: Diagnosis not present

## 2018-01-18 DIAGNOSIS — D509 Iron deficiency anemia, unspecified: Secondary | ICD-10-CM | POA: Diagnosis not present

## 2018-01-18 DIAGNOSIS — N186 End stage renal disease: Secondary | ICD-10-CM | POA: Diagnosis not present

## 2018-01-18 DIAGNOSIS — Z992 Dependence on renal dialysis: Secondary | ICD-10-CM | POA: Diagnosis not present

## 2018-01-18 DIAGNOSIS — J158 Pneumonia due to other specified bacteria: Secondary | ICD-10-CM | POA: Diagnosis not present

## 2018-01-18 DIAGNOSIS — D631 Anemia in chronic kidney disease: Secondary | ICD-10-CM | POA: Diagnosis not present

## 2018-01-20 DIAGNOSIS — D631 Anemia in chronic kidney disease: Secondary | ICD-10-CM | POA: Diagnosis not present

## 2018-01-20 DIAGNOSIS — Z992 Dependence on renal dialysis: Secondary | ICD-10-CM | POA: Diagnosis not present

## 2018-01-20 DIAGNOSIS — N186 End stage renal disease: Secondary | ICD-10-CM | POA: Diagnosis not present

## 2018-01-20 DIAGNOSIS — N2581 Secondary hyperparathyroidism of renal origin: Secondary | ICD-10-CM | POA: Diagnosis not present

## 2018-01-20 DIAGNOSIS — D509 Iron deficiency anemia, unspecified: Secondary | ICD-10-CM | POA: Diagnosis not present

## 2018-01-20 DIAGNOSIS — J158 Pneumonia due to other specified bacteria: Secondary | ICD-10-CM | POA: Diagnosis not present

## 2018-01-22 DIAGNOSIS — N186 End stage renal disease: Secondary | ICD-10-CM | POA: Diagnosis not present

## 2018-01-22 DIAGNOSIS — D631 Anemia in chronic kidney disease: Secondary | ICD-10-CM | POA: Diagnosis not present

## 2018-01-22 DIAGNOSIS — J158 Pneumonia due to other specified bacteria: Secondary | ICD-10-CM | POA: Diagnosis not present

## 2018-01-22 DIAGNOSIS — D509 Iron deficiency anemia, unspecified: Secondary | ICD-10-CM | POA: Diagnosis not present

## 2018-01-22 DIAGNOSIS — N2581 Secondary hyperparathyroidism of renal origin: Secondary | ICD-10-CM | POA: Diagnosis not present

## 2018-01-22 DIAGNOSIS — Z992 Dependence on renal dialysis: Secondary | ICD-10-CM | POA: Diagnosis not present

## 2018-01-24 DIAGNOSIS — Z992 Dependence on renal dialysis: Secondary | ICD-10-CM | POA: Diagnosis not present

## 2018-01-24 DIAGNOSIS — D631 Anemia in chronic kidney disease: Secondary | ICD-10-CM | POA: Diagnosis not present

## 2018-01-24 DIAGNOSIS — N2581 Secondary hyperparathyroidism of renal origin: Secondary | ICD-10-CM | POA: Diagnosis not present

## 2018-01-24 DIAGNOSIS — J158 Pneumonia due to other specified bacteria: Secondary | ICD-10-CM | POA: Diagnosis not present

## 2018-01-24 DIAGNOSIS — N186 End stage renal disease: Secondary | ICD-10-CM | POA: Diagnosis not present

## 2018-01-24 DIAGNOSIS — D509 Iron deficiency anemia, unspecified: Secondary | ICD-10-CM | POA: Diagnosis not present

## 2018-01-29 DIAGNOSIS — D509 Iron deficiency anemia, unspecified: Secondary | ICD-10-CM | POA: Diagnosis not present

## 2018-01-29 DIAGNOSIS — N186 End stage renal disease: Secondary | ICD-10-CM | POA: Diagnosis not present

## 2018-01-29 DIAGNOSIS — N2581 Secondary hyperparathyroidism of renal origin: Secondary | ICD-10-CM | POA: Diagnosis not present

## 2018-01-29 DIAGNOSIS — D631 Anemia in chronic kidney disease: Secondary | ICD-10-CM | POA: Diagnosis not present

## 2018-01-29 DIAGNOSIS — Z992 Dependence on renal dialysis: Secondary | ICD-10-CM | POA: Diagnosis not present

## 2018-01-29 DIAGNOSIS — J158 Pneumonia due to other specified bacteria: Secondary | ICD-10-CM | POA: Diagnosis not present

## 2018-01-31 DIAGNOSIS — Z992 Dependence on renal dialysis: Secondary | ICD-10-CM | POA: Diagnosis not present

## 2018-01-31 DIAGNOSIS — N186 End stage renal disease: Secondary | ICD-10-CM | POA: Diagnosis not present

## 2018-01-31 DIAGNOSIS — N2581 Secondary hyperparathyroidism of renal origin: Secondary | ICD-10-CM | POA: Diagnosis not present

## 2018-01-31 DIAGNOSIS — J158 Pneumonia due to other specified bacteria: Secondary | ICD-10-CM | POA: Diagnosis not present

## 2018-01-31 DIAGNOSIS — D509 Iron deficiency anemia, unspecified: Secondary | ICD-10-CM | POA: Diagnosis not present

## 2018-01-31 DIAGNOSIS — D631 Anemia in chronic kidney disease: Secondary | ICD-10-CM | POA: Diagnosis not present

## 2018-02-02 DIAGNOSIS — J158 Pneumonia due to other specified bacteria: Secondary | ICD-10-CM | POA: Diagnosis not present

## 2018-02-02 DIAGNOSIS — D509 Iron deficiency anemia, unspecified: Secondary | ICD-10-CM | POA: Diagnosis not present

## 2018-02-02 DIAGNOSIS — N186 End stage renal disease: Secondary | ICD-10-CM | POA: Diagnosis not present

## 2018-02-02 DIAGNOSIS — Z992 Dependence on renal dialysis: Secondary | ICD-10-CM | POA: Diagnosis not present

## 2018-02-02 DIAGNOSIS — N2581 Secondary hyperparathyroidism of renal origin: Secondary | ICD-10-CM | POA: Diagnosis not present

## 2018-02-02 DIAGNOSIS — D631 Anemia in chronic kidney disease: Secondary | ICD-10-CM | POA: Diagnosis not present

## 2018-02-03 DIAGNOSIS — Z992 Dependence on renal dialysis: Secondary | ICD-10-CM | POA: Diagnosis not present

## 2018-02-03 DIAGNOSIS — N186 End stage renal disease: Secondary | ICD-10-CM | POA: Diagnosis not present

## 2018-02-03 DIAGNOSIS — I12 Hypertensive chronic kidney disease with 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: Secondary | ICD-10-CM | POA: Diagnosis not present

## 2018-02-04 NOTE — Discharge Summary (Signed)
Physician Discharge Summary         Patient ID: James Wall MRN: [id number] DOB/AGE: [birth year] 53 y.o.  Admit date: 12/22/2017 Discharge date: 02/04/2018  Discharge Diagnoses:    ESRD  Hypertensive crisis Pulmonary Edema  Discharge summary    James Wall is a 53 y.o. male who has a past medical history of ESRD on HD MWF, OSA not on CPAP, COPD, HTN, GERD, depression, and anxiety.  He presented to [facility] ED  11/19 with dyspnea and hypertensive urgency.  He was admitted by FPTS and started on nitro gtt.  Nephrology ws consulted and started normal treatment 11/20 with plans to lower EDW.  He started his session but had to stop early due to experiencing cramps.  He had a total of 2.7L off during his session.  Later that afternoon, he developed worsening dyspnea and bradycardia.  He was noted to have significant pink colored frothy sputum.  Cardiology was consulted and recommended transfer to ICU.  Nephrology was called back and opted to add a second emergent HD session 11/20 for volume removal with max UF.  PCCM consulted to manage medical care while in the ICU.    On 11/21, his breathing status had improved and hypoxia resolved after second dialysis from 11/20. He remained hypertensive and additional hemodialysis was recommended 11/21, however patient refused. Cardiology seen patient and recommended a right and left heart catheterization.  However on 11/22, patient refused further cardiac catheterization.  He did agree to dialysis.  Patient hemodynamically stable in ICU with plans to transfer to telemetry and [facility] Teaching service on 11/23.  Patient seen in dialysis without complaints.  At some point, per documentation, patient had asked to be taken off hemodialysis early and left AMA.    Discharge Plan by Active Problems   Left against medical advice.    Significant Hospital tests/ studies  CXR 11/20 > diffuse pulmonary vascular congestion. Echo 11/20 >  - Left  ventricle: Septal and inferior basal hypokinesis. The cavity   size was mildly dilated. Wall thickness was normal. Systolic   function was mildly reduced. The estimated ejection fraction was   in the range of 45% to 50%. Doppler parameters are consistent   with both elevated ventricular end-diastolic filling pressure and   elevated left atrial filling pressure. - Aortic valve: There was trivial regurgitation. - Mitral valve: Moderately calcified annulus. Moderately thickened,   moderately calcified leaflets . There was mild to moderate   regurgitation. - Left atrium: The atrium was moderately dilated. - Right atrium: The atrium was mildly dilated. - Atrial septum: No defect or patent foramen ovale was identified. - Tricuspid valve: There was moderate regurgitation. - Pulmonary arteries: PA peak pressure: 97 mm Hg (S). - Impressions: Severe pulmonary hypertension based on TR velocity. Impressions: - Severe pulmonary hypertension based on TR velocity.  Procedures    Culture data/antimicrobials   none   Consults  Nephrology  Cardiology   Discharge Exam: BP (!) 143/107 (BP Location: Right Arm)   Pulse 81   Temp 98.5 F (36.9 C) (Oral)   Resp 14   Ht 5' 5"  (1.651 m)   Wt 76.4 kg   SpO2 100%   BMI 28.03 kg/m   Unable to perform. Labs at discharge   Lab Results  Component Value Date   CREATININE 8.35 (H) 12/25/2017   BUN 19 12/25/2017   NA 136 12/25/2017   K 3.9 12/25/2017   CL 99 12/25/2017   CO2 28 12/25/2017  Lab Results  Component Value Date   WBC 4.6 12/25/2017   HGB 8.6 (L) 12/25/2017   HCT 29.3 (L) 12/25/2017   MCV 91.3 12/25/2017   PLT 206 12/25/2017   Lab Results  Component Value Date   ALT 12 12/22/2017   AST 23 12/22/2017   ALKPHOS 40 12/22/2017   BILITOT 1.2 12/22/2017   Lab Results  Component Value Date   INR 1.05 12/22/2017   INR 1.06 10/22/2015   INR 1.19 07/09/2013    Current radiological studies    No results  found.  Disposition:  Left AMA   Allergies as of 12/25/2017      Reactions   Aspirin Other (See Comments)   Made congestion worse      Medication List    ASK your doctor about these medications   acetaminophen 650 MG CR tablet Commonly known as:  TYLENOL Take 1 tablet (650 mg total) by mouth daily as needed for pain.   amLODipine 10 MG tablet Commonly known as:  NORVASC Take 1 tablet (10 mg total) by mouth daily.   cloNIDine 0.3 mg/24hr patch Commonly known as:  CATAPRES - Dosed in mg/24 hr Place 0.3 mg onto the skin every Sunday. Saturday.   hydrALAZINE 100 MG tablet Commonly known as:  APRESOLINE TAKE 1 TABLET BY MOUTH 3 TIMES DAILY   hydrOXYzine 25 MG tablet Commonly known as:  ATARAX/VISTARIL Take 1 tablet (25 mg total) by mouth every 8 (eight) hours as needed for itching. Further refills need to come from your PCP.   LORazepam 0.5 MG tablet Commonly known as:  ATIVAN Take 1 tablet (0.5 mg total) by mouth 2 (two) times daily as needed for anxiety.   metoprolol tartrate 100 MG tablet Commonly known as:  LOPRESSOR Take 100 mg by mouth 2 (two) times daily.   SENSIPAR 60 MG tablet Generic drug:  cinacalcet Take 1 tablet (60 mg total) by mouth every Monday, Wednesday, and Friday with hemodialysis.   sevelamer carbonate 800 MG tablet Commonly known as:  RENVELA Take 3,200-4,800 mg by mouth See admin instructions. Take 4-6 tablets (3200-4800 mg) by mouth one to two times daily with meals   tamsulosin 0.4 MG Caps capsule Commonly known as:  FLOMAX Take 0.4 mg by mouth every other day.        Follow-up appointment    Discharge Condition:   Unknown  Signed:  Kennieth Rad, MSN, AGACNP-BC  Pulmonary & Critical Care Pgr: [phone] or if no answer [phone] 02/04/2018, 3:19 PM

## 2018-02-05 DIAGNOSIS — N186 End stage renal disease: Secondary | ICD-10-CM | POA: Diagnosis not present

## 2018-02-05 DIAGNOSIS — J158 Pneumonia due to other specified bacteria: Secondary | ICD-10-CM | POA: Diagnosis not present

## 2018-02-05 DIAGNOSIS — Z992 Dependence on renal dialysis: Secondary | ICD-10-CM | POA: Diagnosis not present

## 2018-02-05 DIAGNOSIS — N2581 Secondary hyperparathyroidism of renal origin: Secondary | ICD-10-CM | POA: Diagnosis not present

## 2018-02-05 DIAGNOSIS — D631 Anemia in chronic kidney disease: Secondary | ICD-10-CM | POA: Diagnosis not present

## 2018-02-05 DIAGNOSIS — D509 Iron deficiency anemia, unspecified: Secondary | ICD-10-CM | POA: Diagnosis not present

## 2018-02-08 DIAGNOSIS — D631 Anemia in chronic kidney disease: Secondary | ICD-10-CM | POA: Diagnosis not present

## 2018-02-08 DIAGNOSIS — D509 Iron deficiency anemia, unspecified: Secondary | ICD-10-CM | POA: Diagnosis not present

## 2018-02-08 DIAGNOSIS — N186 End stage renal disease: Secondary | ICD-10-CM | POA: Diagnosis not present

## 2018-02-08 DIAGNOSIS — J158 Pneumonia due to other specified bacteria: Secondary | ICD-10-CM | POA: Diagnosis not present

## 2018-02-08 DIAGNOSIS — Z992 Dependence on renal dialysis: Secondary | ICD-10-CM | POA: Diagnosis not present

## 2018-02-08 DIAGNOSIS — N2581 Secondary hyperparathyroidism of renal origin: Secondary | ICD-10-CM | POA: Diagnosis not present

## 2018-02-10 DIAGNOSIS — J158 Pneumonia due to other specified bacteria: Secondary | ICD-10-CM | POA: Diagnosis not present

## 2018-02-10 DIAGNOSIS — N186 End stage renal disease: Secondary | ICD-10-CM | POA: Diagnosis not present

## 2018-02-10 DIAGNOSIS — D631 Anemia in chronic kidney disease: Secondary | ICD-10-CM | POA: Diagnosis not present

## 2018-02-10 DIAGNOSIS — D509 Iron deficiency anemia, unspecified: Secondary | ICD-10-CM | POA: Diagnosis not present

## 2018-02-10 DIAGNOSIS — N2581 Secondary hyperparathyroidism of renal origin: Secondary | ICD-10-CM | POA: Diagnosis not present

## 2018-02-10 DIAGNOSIS — Z992 Dependence on renal dialysis: Secondary | ICD-10-CM | POA: Diagnosis not present

## 2018-02-12 DIAGNOSIS — Z992 Dependence on renal dialysis: Secondary | ICD-10-CM | POA: Diagnosis not present

## 2018-02-12 DIAGNOSIS — J158 Pneumonia due to other specified bacteria: Secondary | ICD-10-CM | POA: Diagnosis not present

## 2018-02-12 DIAGNOSIS — N2581 Secondary hyperparathyroidism of renal origin: Secondary | ICD-10-CM | POA: Diagnosis not present

## 2018-02-12 DIAGNOSIS — D509 Iron deficiency anemia, unspecified: Secondary | ICD-10-CM | POA: Diagnosis not present

## 2018-02-12 DIAGNOSIS — D631 Anemia in chronic kidney disease: Secondary | ICD-10-CM | POA: Diagnosis not present

## 2018-02-12 DIAGNOSIS — N186 End stage renal disease: Secondary | ICD-10-CM | POA: Diagnosis not present

## 2018-02-15 DIAGNOSIS — N186 End stage renal disease: Secondary | ICD-10-CM | POA: Diagnosis not present

## 2018-02-15 DIAGNOSIS — D631 Anemia in chronic kidney disease: Secondary | ICD-10-CM | POA: Diagnosis not present

## 2018-02-15 DIAGNOSIS — J158 Pneumonia due to other specified bacteria: Secondary | ICD-10-CM | POA: Diagnosis not present

## 2018-02-15 DIAGNOSIS — D509 Iron deficiency anemia, unspecified: Secondary | ICD-10-CM | POA: Diagnosis not present

## 2018-02-15 DIAGNOSIS — Z992 Dependence on renal dialysis: Secondary | ICD-10-CM | POA: Diagnosis not present

## 2018-02-15 DIAGNOSIS — N2581 Secondary hyperparathyroidism of renal origin: Secondary | ICD-10-CM | POA: Diagnosis not present

## 2018-02-17 DIAGNOSIS — D631 Anemia in chronic kidney disease: Secondary | ICD-10-CM | POA: Diagnosis not present

## 2018-02-17 DIAGNOSIS — N2581 Secondary hyperparathyroidism of renal origin: Secondary | ICD-10-CM | POA: Diagnosis not present

## 2018-02-17 DIAGNOSIS — J158 Pneumonia due to other specified bacteria: Secondary | ICD-10-CM | POA: Diagnosis not present

## 2018-02-17 DIAGNOSIS — Z992 Dependence on renal dialysis: Secondary | ICD-10-CM | POA: Diagnosis not present

## 2018-02-17 DIAGNOSIS — D509 Iron deficiency anemia, unspecified: Secondary | ICD-10-CM | POA: Diagnosis not present

## 2018-02-17 DIAGNOSIS — N186 End stage renal disease: Secondary | ICD-10-CM | POA: Diagnosis not present

## 2018-02-19 DIAGNOSIS — Z992 Dependence on renal dialysis: Secondary | ICD-10-CM | POA: Diagnosis not present

## 2018-02-19 DIAGNOSIS — N186 End stage renal disease: Secondary | ICD-10-CM | POA: Diagnosis not present

## 2018-02-19 DIAGNOSIS — N2581 Secondary hyperparathyroidism of renal origin: Secondary | ICD-10-CM | POA: Diagnosis not present

## 2018-02-19 DIAGNOSIS — J158 Pneumonia due to other specified bacteria: Secondary | ICD-10-CM | POA: Diagnosis not present

## 2018-02-19 DIAGNOSIS — D509 Iron deficiency anemia, unspecified: Secondary | ICD-10-CM | POA: Diagnosis not present

## 2018-02-19 DIAGNOSIS — D631 Anemia in chronic kidney disease: Secondary | ICD-10-CM | POA: Diagnosis not present

## 2018-02-22 DIAGNOSIS — D509 Iron deficiency anemia, unspecified: Secondary | ICD-10-CM | POA: Diagnosis not present

## 2018-02-22 DIAGNOSIS — D631 Anemia in chronic kidney disease: Secondary | ICD-10-CM | POA: Diagnosis not present

## 2018-02-22 DIAGNOSIS — Z992 Dependence on renal dialysis: Secondary | ICD-10-CM | POA: Diagnosis not present

## 2018-02-22 DIAGNOSIS — N186 End stage renal disease: Secondary | ICD-10-CM | POA: Diagnosis not present

## 2018-02-22 DIAGNOSIS — N2581 Secondary hyperparathyroidism of renal origin: Secondary | ICD-10-CM | POA: Diagnosis not present

## 2018-02-22 DIAGNOSIS — J158 Pneumonia due to other specified bacteria: Secondary | ICD-10-CM | POA: Diagnosis not present

## 2018-02-24 DIAGNOSIS — N2581 Secondary hyperparathyroidism of renal origin: Secondary | ICD-10-CM | POA: Diagnosis not present

## 2018-02-24 DIAGNOSIS — D509 Iron deficiency anemia, unspecified: Secondary | ICD-10-CM | POA: Diagnosis not present

## 2018-02-24 DIAGNOSIS — J158 Pneumonia due to other specified bacteria: Secondary | ICD-10-CM | POA: Diagnosis not present

## 2018-02-24 DIAGNOSIS — N186 End stage renal disease: Secondary | ICD-10-CM | POA: Diagnosis not present

## 2018-02-24 DIAGNOSIS — Z992 Dependence on renal dialysis: Secondary | ICD-10-CM | POA: Diagnosis not present

## 2018-02-24 DIAGNOSIS — D631 Anemia in chronic kidney disease: Secondary | ICD-10-CM | POA: Diagnosis not present

## 2018-02-26 DIAGNOSIS — J158 Pneumonia due to other specified bacteria: Secondary | ICD-10-CM | POA: Diagnosis not present

## 2018-02-26 DIAGNOSIS — N186 End stage renal disease: Secondary | ICD-10-CM | POA: Diagnosis not present

## 2018-02-26 DIAGNOSIS — Z992 Dependence on renal dialysis: Secondary | ICD-10-CM | POA: Diagnosis not present

## 2018-02-26 DIAGNOSIS — N2581 Secondary hyperparathyroidism of renal origin: Secondary | ICD-10-CM | POA: Diagnosis not present

## 2018-02-26 DIAGNOSIS — D509 Iron deficiency anemia, unspecified: Secondary | ICD-10-CM | POA: Diagnosis not present

## 2018-02-26 DIAGNOSIS — D631 Anemia in chronic kidney disease: Secondary | ICD-10-CM | POA: Diagnosis not present

## 2018-03-01 DIAGNOSIS — D509 Iron deficiency anemia, unspecified: Secondary | ICD-10-CM | POA: Diagnosis not present

## 2018-03-01 DIAGNOSIS — N186 End stage renal disease: Secondary | ICD-10-CM | POA: Diagnosis not present

## 2018-03-01 DIAGNOSIS — J158 Pneumonia due to other specified bacteria: Secondary | ICD-10-CM | POA: Diagnosis not present

## 2018-03-01 DIAGNOSIS — N2581 Secondary hyperparathyroidism of renal origin: Secondary | ICD-10-CM | POA: Diagnosis not present

## 2018-03-01 DIAGNOSIS — Z992 Dependence on renal dialysis: Secondary | ICD-10-CM | POA: Diagnosis not present

## 2018-03-01 DIAGNOSIS — D631 Anemia in chronic kidney disease: Secondary | ICD-10-CM | POA: Diagnosis not present

## 2018-03-03 DIAGNOSIS — N186 End stage renal disease: Secondary | ICD-10-CM | POA: Diagnosis not present

## 2018-03-03 DIAGNOSIS — J158 Pneumonia due to other specified bacteria: Secondary | ICD-10-CM | POA: Diagnosis not present

## 2018-03-03 DIAGNOSIS — N2581 Secondary hyperparathyroidism of renal origin: Secondary | ICD-10-CM | POA: Diagnosis not present

## 2018-03-03 DIAGNOSIS — Z992 Dependence on renal dialysis: Secondary | ICD-10-CM | POA: Diagnosis not present

## 2018-03-03 DIAGNOSIS — D509 Iron deficiency anemia, unspecified: Secondary | ICD-10-CM | POA: Diagnosis not present

## 2018-03-03 DIAGNOSIS — D631 Anemia in chronic kidney disease: Secondary | ICD-10-CM | POA: Diagnosis not present

## 2018-03-05 DIAGNOSIS — N2581 Secondary hyperparathyroidism of renal origin: Secondary | ICD-10-CM | POA: Diagnosis not present

## 2018-03-05 DIAGNOSIS — J158 Pneumonia due to other specified bacteria: Secondary | ICD-10-CM | POA: Diagnosis not present

## 2018-03-05 DIAGNOSIS — D631 Anemia in chronic kidney disease: Secondary | ICD-10-CM | POA: Diagnosis not present

## 2018-03-05 DIAGNOSIS — D509 Iron deficiency anemia, unspecified: Secondary | ICD-10-CM | POA: Diagnosis not present

## 2018-03-05 DIAGNOSIS — N186 End stage renal disease: Secondary | ICD-10-CM | POA: Diagnosis not present

## 2018-03-05 DIAGNOSIS — Z992 Dependence on renal dialysis: Secondary | ICD-10-CM | POA: Diagnosis not present

## 2018-03-06 DIAGNOSIS — Z992 Dependence on renal dialysis: Secondary | ICD-10-CM | POA: Diagnosis not present

## 2018-03-06 DIAGNOSIS — N186 End stage renal disease: Secondary | ICD-10-CM | POA: Diagnosis not present

## 2018-03-06 DIAGNOSIS — I12 Hypertensive chronic kidney disease with 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: Secondary | ICD-10-CM | POA: Diagnosis not present

## 2018-03-08 DIAGNOSIS — D631 Anemia in chronic kidney disease: Secondary | ICD-10-CM | POA: Diagnosis not present

## 2018-03-08 DIAGNOSIS — N2581 Secondary hyperparathyroidism of renal origin: Secondary | ICD-10-CM | POA: Diagnosis not present

## 2018-03-08 DIAGNOSIS — J158 Pneumonia due to other specified bacteria: Secondary | ICD-10-CM | POA: Diagnosis not present

## 2018-03-08 DIAGNOSIS — N186 End stage renal disease: Secondary | ICD-10-CM | POA: Diagnosis not present

## 2018-03-08 DIAGNOSIS — D509 Iron deficiency anemia, unspecified: Secondary | ICD-10-CM | POA: Diagnosis not present

## 2018-03-08 DIAGNOSIS — Z992 Dependence on renal dialysis: Secondary | ICD-10-CM | POA: Diagnosis not present

## 2018-03-10 DIAGNOSIS — N2581 Secondary hyperparathyroidism of renal origin: Secondary | ICD-10-CM | POA: Diagnosis not present

## 2018-03-10 DIAGNOSIS — J158 Pneumonia due to other specified bacteria: Secondary | ICD-10-CM | POA: Diagnosis not present

## 2018-03-10 DIAGNOSIS — Z992 Dependence on renal dialysis: Secondary | ICD-10-CM | POA: Diagnosis not present

## 2018-03-10 DIAGNOSIS — D631 Anemia in chronic kidney disease: Secondary | ICD-10-CM | POA: Diagnosis not present

## 2018-03-10 DIAGNOSIS — N186 End stage renal disease: Secondary | ICD-10-CM | POA: Diagnosis not present

## 2018-03-10 DIAGNOSIS — D509 Iron deficiency anemia, unspecified: Secondary | ICD-10-CM | POA: Diagnosis not present

## 2018-03-12 DIAGNOSIS — J158 Pneumonia due to other specified bacteria: Secondary | ICD-10-CM | POA: Diagnosis not present

## 2018-03-12 DIAGNOSIS — D631 Anemia in chronic kidney disease: Secondary | ICD-10-CM | POA: Diagnosis not present

## 2018-03-12 DIAGNOSIS — D509 Iron deficiency anemia, unspecified: Secondary | ICD-10-CM | POA: Diagnosis not present

## 2018-03-12 DIAGNOSIS — N2581 Secondary hyperparathyroidism of renal origin: Secondary | ICD-10-CM | POA: Diagnosis not present

## 2018-03-12 DIAGNOSIS — N186 End stage renal disease: Secondary | ICD-10-CM | POA: Diagnosis not present

## 2018-03-12 DIAGNOSIS — Z992 Dependence on renal dialysis: Secondary | ICD-10-CM | POA: Diagnosis not present

## 2018-03-15 DIAGNOSIS — D631 Anemia in chronic kidney disease: Secondary | ICD-10-CM | POA: Diagnosis not present

## 2018-03-15 DIAGNOSIS — N186 End stage renal disease: Secondary | ICD-10-CM | POA: Diagnosis not present

## 2018-03-15 DIAGNOSIS — Z992 Dependence on renal dialysis: Secondary | ICD-10-CM | POA: Diagnosis not present

## 2018-03-15 DIAGNOSIS — N2581 Secondary hyperparathyroidism of renal origin: Secondary | ICD-10-CM | POA: Diagnosis not present

## 2018-03-15 DIAGNOSIS — D509 Iron deficiency anemia, unspecified: Secondary | ICD-10-CM | POA: Diagnosis not present

## 2018-03-15 DIAGNOSIS — J158 Pneumonia due to other specified bacteria: Secondary | ICD-10-CM | POA: Diagnosis not present

## 2018-03-17 DIAGNOSIS — D509 Iron deficiency anemia, unspecified: Secondary | ICD-10-CM | POA: Diagnosis not present

## 2018-03-17 DIAGNOSIS — N2581 Secondary hyperparathyroidism of renal origin: Secondary | ICD-10-CM | POA: Diagnosis not present

## 2018-03-17 DIAGNOSIS — J158 Pneumonia due to other specified bacteria: Secondary | ICD-10-CM | POA: Diagnosis not present

## 2018-03-17 DIAGNOSIS — Z992 Dependence on renal dialysis: Secondary | ICD-10-CM | POA: Diagnosis not present

## 2018-03-17 DIAGNOSIS — D631 Anemia in chronic kidney disease: Secondary | ICD-10-CM | POA: Diagnosis not present

## 2018-03-17 DIAGNOSIS — N186 End stage renal disease: Secondary | ICD-10-CM | POA: Diagnosis not present

## 2018-03-19 DIAGNOSIS — D631 Anemia in chronic kidney disease: Secondary | ICD-10-CM | POA: Diagnosis not present

## 2018-03-19 DIAGNOSIS — Z992 Dependence on renal dialysis: Secondary | ICD-10-CM | POA: Diagnosis not present

## 2018-03-19 DIAGNOSIS — J158 Pneumonia due to other specified bacteria: Secondary | ICD-10-CM | POA: Diagnosis not present

## 2018-03-19 DIAGNOSIS — N2581 Secondary hyperparathyroidism of renal origin: Secondary | ICD-10-CM | POA: Diagnosis not present

## 2018-03-19 DIAGNOSIS — N186 End stage renal disease: Secondary | ICD-10-CM | POA: Diagnosis not present

## 2018-03-19 DIAGNOSIS — D509 Iron deficiency anemia, unspecified: Secondary | ICD-10-CM | POA: Diagnosis not present

## 2018-03-22 DIAGNOSIS — D509 Iron deficiency anemia, unspecified: Secondary | ICD-10-CM | POA: Diagnosis not present

## 2018-03-22 DIAGNOSIS — N2581 Secondary hyperparathyroidism of renal origin: Secondary | ICD-10-CM | POA: Diagnosis not present

## 2018-03-22 DIAGNOSIS — J158 Pneumonia due to other specified bacteria: Secondary | ICD-10-CM | POA: Diagnosis not present

## 2018-03-22 DIAGNOSIS — D631 Anemia in chronic kidney disease: Secondary | ICD-10-CM | POA: Diagnosis not present

## 2018-03-22 DIAGNOSIS — N186 End stage renal disease: Secondary | ICD-10-CM | POA: Diagnosis not present

## 2018-03-22 DIAGNOSIS — Z992 Dependence on renal dialysis: Secondary | ICD-10-CM | POA: Diagnosis not present

## 2018-03-23 ENCOUNTER — Other Ambulatory Visit: Payer: Self-pay | Admitting: Family Medicine

## 2018-03-23 ENCOUNTER — Telehealth: Payer: Self-pay | Admitting: *Deleted

## 2018-03-23 DIAGNOSIS — Z299 Encounter for prophylactic measures, unspecified: Secondary | ICD-10-CM

## 2018-03-23 MED ORDER — AMOXICILLIN 500 MG PO CAPS: 2000.0000 mg | ORAL_CAPSULE | Freq: Once | ORAL | 0 refills | Status: AC

## 2018-03-23 NOTE — Progress Notes (Signed)
Patient having dental procedure and needs abx prohpylaxis, 78m amoxicillin send  -Dr. BCriss Rosales

## 2018-03-23 NOTE — Telephone Encounter (Signed)
Pt lm on nurse line.  He is having a dental procedure on Thursday and was told he needs to get abx from his PCP before the procedure. Fleeger, Salome Spotted, CMA

## 2018-03-24 DIAGNOSIS — Z992 Dependence on renal dialysis: Secondary | ICD-10-CM | POA: Diagnosis not present

## 2018-03-24 DIAGNOSIS — J158 Pneumonia due to other specified bacteria: Secondary | ICD-10-CM | POA: Diagnosis not present

## 2018-03-24 DIAGNOSIS — D631 Anemia in chronic kidney disease: Secondary | ICD-10-CM | POA: Diagnosis not present

## 2018-03-24 DIAGNOSIS — N186 End stage renal disease: Secondary | ICD-10-CM | POA: Diagnosis not present

## 2018-03-24 DIAGNOSIS — D509 Iron deficiency anemia, unspecified: Secondary | ICD-10-CM | POA: Diagnosis not present

## 2018-03-24 DIAGNOSIS — N2581 Secondary hyperparathyroidism of renal origin: Secondary | ICD-10-CM | POA: Diagnosis not present

## 2018-03-24 NOTE — Progress Notes (Signed)
Pt has been informed and has received a call from pharmacy. Ottis Stain, CMA

## 2018-03-29 DIAGNOSIS — N186 End stage renal disease: Secondary | ICD-10-CM | POA: Diagnosis not present

## 2018-03-29 DIAGNOSIS — J158 Pneumonia due to other specified bacteria: Secondary | ICD-10-CM | POA: Diagnosis not present

## 2018-03-29 DIAGNOSIS — Z992 Dependence on renal dialysis: Secondary | ICD-10-CM | POA: Diagnosis not present

## 2018-03-29 DIAGNOSIS — D509 Iron deficiency anemia, unspecified: Secondary | ICD-10-CM | POA: Diagnosis not present

## 2018-03-29 DIAGNOSIS — N2581 Secondary hyperparathyroidism of renal origin: Secondary | ICD-10-CM | POA: Diagnosis not present

## 2018-03-29 DIAGNOSIS — D631 Anemia in chronic kidney disease: Secondary | ICD-10-CM | POA: Diagnosis not present

## 2018-03-29 NOTE — Telephone Encounter (Signed)
See orders only encounter. Fleeger, Salome Spotted, CMA

## 2018-03-31 DIAGNOSIS — N186 End stage renal disease: Secondary | ICD-10-CM | POA: Diagnosis not present

## 2018-03-31 DIAGNOSIS — D631 Anemia in chronic kidney disease: Secondary | ICD-10-CM | POA: Diagnosis not present

## 2018-03-31 DIAGNOSIS — N2581 Secondary hyperparathyroidism of renal origin: Secondary | ICD-10-CM | POA: Diagnosis not present

## 2018-03-31 DIAGNOSIS — J158 Pneumonia due to other specified bacteria: Secondary | ICD-10-CM | POA: Diagnosis not present

## 2018-03-31 DIAGNOSIS — D509 Iron deficiency anemia, unspecified: Secondary | ICD-10-CM | POA: Diagnosis not present

## 2018-03-31 DIAGNOSIS — Z992 Dependence on renal dialysis: Secondary | ICD-10-CM | POA: Diagnosis not present

## 2018-04-02 DIAGNOSIS — N2581 Secondary hyperparathyroidism of renal origin: Secondary | ICD-10-CM | POA: Diagnosis not present

## 2018-04-02 DIAGNOSIS — Z992 Dependence on renal dialysis: Secondary | ICD-10-CM | POA: Diagnosis not present

## 2018-04-02 DIAGNOSIS — N186 End stage renal disease: Secondary | ICD-10-CM | POA: Diagnosis not present

## 2018-04-02 DIAGNOSIS — D509 Iron deficiency anemia, unspecified: Secondary | ICD-10-CM | POA: Diagnosis not present

## 2018-04-02 DIAGNOSIS — J158 Pneumonia due to other specified bacteria: Secondary | ICD-10-CM | POA: Diagnosis not present

## 2018-04-02 DIAGNOSIS — D631 Anemia in chronic kidney disease: Secondary | ICD-10-CM | POA: Diagnosis not present

## 2018-04-04 DIAGNOSIS — I12 Hypertensive chronic kidney disease with 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: Secondary | ICD-10-CM | POA: Diagnosis not present

## 2018-04-04 DIAGNOSIS — N186 End stage renal disease: Secondary | ICD-10-CM | POA: Diagnosis not present

## 2018-04-04 DIAGNOSIS — Z992 Dependence on renal dialysis: Secondary | ICD-10-CM | POA: Diagnosis not present

## 2018-04-05 DIAGNOSIS — D509 Iron deficiency anemia, unspecified: Secondary | ICD-10-CM | POA: Diagnosis not present

## 2018-04-05 DIAGNOSIS — Z992 Dependence on renal dialysis: Secondary | ICD-10-CM | POA: Diagnosis not present

## 2018-04-05 DIAGNOSIS — D631 Anemia in chronic kidney disease: Secondary | ICD-10-CM | POA: Diagnosis not present

## 2018-04-05 DIAGNOSIS — J158 Pneumonia due to other specified bacteria: Secondary | ICD-10-CM | POA: Diagnosis not present

## 2018-04-05 DIAGNOSIS — N186 End stage renal disease: Secondary | ICD-10-CM | POA: Diagnosis not present

## 2018-04-05 DIAGNOSIS — D689 Coagulation defect, unspecified: Secondary | ICD-10-CM | POA: Diagnosis not present

## 2018-04-05 DIAGNOSIS — N2581 Secondary hyperparathyroidism of renal origin: Secondary | ICD-10-CM | POA: Diagnosis not present

## 2018-04-07 DIAGNOSIS — N2581 Secondary hyperparathyroidism of renal origin: Secondary | ICD-10-CM | POA: Diagnosis not present

## 2018-04-07 DIAGNOSIS — D509 Iron deficiency anemia, unspecified: Secondary | ICD-10-CM | POA: Diagnosis not present

## 2018-04-07 DIAGNOSIS — J158 Pneumonia due to other specified bacteria: Secondary | ICD-10-CM | POA: Diagnosis not present

## 2018-04-07 DIAGNOSIS — D689 Coagulation defect, unspecified: Secondary | ICD-10-CM | POA: Diagnosis not present

## 2018-04-07 DIAGNOSIS — Z992 Dependence on renal dialysis: Secondary | ICD-10-CM | POA: Diagnosis not present

## 2018-04-07 DIAGNOSIS — N186 End stage renal disease: Secondary | ICD-10-CM | POA: Diagnosis not present

## 2018-04-08 ENCOUNTER — Other Ambulatory Visit: Payer: Self-pay | Admitting: Cardiology

## 2018-04-09 ENCOUNTER — Other Ambulatory Visit: Payer: Self-pay | Admitting: Cardiology

## 2018-04-09 DIAGNOSIS — N186 End stage renal disease: Secondary | ICD-10-CM | POA: Diagnosis not present

## 2018-04-09 DIAGNOSIS — N2581 Secondary hyperparathyroidism of renal origin: Secondary | ICD-10-CM | POA: Diagnosis not present

## 2018-04-09 DIAGNOSIS — D509 Iron deficiency anemia, unspecified: Secondary | ICD-10-CM | POA: Diagnosis not present

## 2018-04-09 DIAGNOSIS — D689 Coagulation defect, unspecified: Secondary | ICD-10-CM | POA: Diagnosis not present

## 2018-04-09 DIAGNOSIS — J158 Pneumonia due to other specified bacteria: Secondary | ICD-10-CM | POA: Diagnosis not present

## 2018-04-09 DIAGNOSIS — Z992 Dependence on renal dialysis: Secondary | ICD-10-CM | POA: Diagnosis not present

## 2018-04-12 ENCOUNTER — Telehealth: Payer: Self-pay

## 2018-04-12 DIAGNOSIS — D509 Iron deficiency anemia, unspecified: Secondary | ICD-10-CM | POA: Diagnosis not present

## 2018-04-12 DIAGNOSIS — N186 End stage renal disease: Secondary | ICD-10-CM | POA: Diagnosis not present

## 2018-04-12 DIAGNOSIS — D689 Coagulation defect, unspecified: Secondary | ICD-10-CM | POA: Diagnosis not present

## 2018-04-12 DIAGNOSIS — N2581 Secondary hyperparathyroidism of renal origin: Secondary | ICD-10-CM | POA: Diagnosis not present

## 2018-04-12 DIAGNOSIS — J158 Pneumonia due to other specified bacteria: Secondary | ICD-10-CM | POA: Diagnosis not present

## 2018-04-12 DIAGNOSIS — Z992 Dependence on renal dialysis: Secondary | ICD-10-CM | POA: Diagnosis not present

## 2018-04-12 MED ORDER — AMOXICILLIN 500 MG PO CAPS: 2000.0000 mg | ORAL_CAPSULE | Freq: Once | ORAL | 0 refills | Status: AC

## 2018-04-12 NOTE — Telephone Encounter (Signed)
Patient called in to report a dental appt Thursday for a cleaning. States he is supposed to have an antibiotic prior to appointment.  Danley Danker, RN [facility] RN)

## 2018-04-12 NOTE — Telephone Encounter (Signed)
2G amoxacillin sent for dental prophylaxis  -Dr. Criss Rosales

## 2018-04-14 DIAGNOSIS — D509 Iron deficiency anemia, unspecified: Secondary | ICD-10-CM | POA: Diagnosis not present

## 2018-04-14 DIAGNOSIS — N186 End stage renal disease: Secondary | ICD-10-CM | POA: Diagnosis not present

## 2018-04-14 DIAGNOSIS — N2581 Secondary hyperparathyroidism of renal origin: Secondary | ICD-10-CM | POA: Diagnosis not present

## 2018-04-14 DIAGNOSIS — J158 Pneumonia due to other specified bacteria: Secondary | ICD-10-CM | POA: Diagnosis not present

## 2018-04-14 DIAGNOSIS — D689 Coagulation defect, unspecified: Secondary | ICD-10-CM | POA: Diagnosis not present

## 2018-04-14 DIAGNOSIS — Z992 Dependence on renal dialysis: Secondary | ICD-10-CM | POA: Diagnosis not present

## 2018-04-16 DIAGNOSIS — R197 Diarrhea, unspecified: Secondary | ICD-10-CM | POA: Insufficient documentation

## 2018-04-16 DIAGNOSIS — N2581 Secondary hyperparathyroidism of renal origin: Secondary | ICD-10-CM | POA: Diagnosis not present

## 2018-04-16 DIAGNOSIS — Z992 Dependence on renal dialysis: Secondary | ICD-10-CM | POA: Diagnosis not present

## 2018-04-16 DIAGNOSIS — D689 Coagulation defect, unspecified: Secondary | ICD-10-CM | POA: Diagnosis not present

## 2018-04-16 DIAGNOSIS — J158 Pneumonia due to other specified bacteria: Secondary | ICD-10-CM | POA: Diagnosis not present

## 2018-04-16 DIAGNOSIS — D509 Iron deficiency anemia, unspecified: Secondary | ICD-10-CM | POA: Diagnosis not present

## 2018-04-16 DIAGNOSIS — N186 End stage renal disease: Secondary | ICD-10-CM | POA: Diagnosis not present

## 2018-04-19 DIAGNOSIS — Z992 Dependence on renal dialysis: Secondary | ICD-10-CM | POA: Diagnosis not present

## 2018-04-19 DIAGNOSIS — N186 End stage renal disease: Secondary | ICD-10-CM | POA: Diagnosis not present

## 2018-04-19 DIAGNOSIS — D509 Iron deficiency anemia, unspecified: Secondary | ICD-10-CM | POA: Diagnosis not present

## 2018-04-19 DIAGNOSIS — J158 Pneumonia due to other specified bacteria: Secondary | ICD-10-CM | POA: Diagnosis not present

## 2018-04-19 DIAGNOSIS — N2581 Secondary hyperparathyroidism of renal origin: Secondary | ICD-10-CM | POA: Diagnosis not present

## 2018-04-19 DIAGNOSIS — D689 Coagulation defect, unspecified: Secondary | ICD-10-CM | POA: Diagnosis not present

## 2018-04-21 DIAGNOSIS — D689 Coagulation defect, unspecified: Secondary | ICD-10-CM | POA: Diagnosis not present

## 2018-04-21 DIAGNOSIS — J158 Pneumonia due to other specified bacteria: Secondary | ICD-10-CM | POA: Diagnosis not present

## 2018-04-21 DIAGNOSIS — N2581 Secondary hyperparathyroidism of renal origin: Secondary | ICD-10-CM | POA: Diagnosis not present

## 2018-04-21 DIAGNOSIS — D509 Iron deficiency anemia, unspecified: Secondary | ICD-10-CM | POA: Diagnosis not present

## 2018-04-21 DIAGNOSIS — Z992 Dependence on renal dialysis: Secondary | ICD-10-CM | POA: Diagnosis not present

## 2018-04-21 DIAGNOSIS — N186 End stage renal disease: Secondary | ICD-10-CM | POA: Diagnosis not present

## 2018-04-23 DIAGNOSIS — J158 Pneumonia due to other specified bacteria: Secondary | ICD-10-CM | POA: Diagnosis not present

## 2018-04-23 DIAGNOSIS — Z992 Dependence on renal dialysis: Secondary | ICD-10-CM | POA: Diagnosis not present

## 2018-04-23 DIAGNOSIS — N186 End stage renal disease: Secondary | ICD-10-CM | POA: Diagnosis not present

## 2018-04-23 DIAGNOSIS — N2581 Secondary hyperparathyroidism of renal origin: Secondary | ICD-10-CM | POA: Diagnosis not present

## 2018-04-23 DIAGNOSIS — D509 Iron deficiency anemia, unspecified: Secondary | ICD-10-CM | POA: Diagnosis not present

## 2018-04-23 DIAGNOSIS — D689 Coagulation defect, unspecified: Secondary | ICD-10-CM | POA: Diagnosis not present

## 2018-04-25 ENCOUNTER — Other Ambulatory Visit: Payer: Self-pay | Admitting: Cardiology

## 2018-04-26 DIAGNOSIS — D689 Coagulation defect, unspecified: Secondary | ICD-10-CM | POA: Diagnosis not present

## 2018-04-26 DIAGNOSIS — Z992 Dependence on renal dialysis: Secondary | ICD-10-CM | POA: Diagnosis not present

## 2018-04-26 DIAGNOSIS — N2581 Secondary hyperparathyroidism of renal origin: Secondary | ICD-10-CM | POA: Diagnosis not present

## 2018-04-26 DIAGNOSIS — J158 Pneumonia due to other specified bacteria: Secondary | ICD-10-CM | POA: Diagnosis not present

## 2018-04-26 DIAGNOSIS — N186 End stage renal disease: Secondary | ICD-10-CM | POA: Diagnosis not present

## 2018-04-26 DIAGNOSIS — D509 Iron deficiency anemia, unspecified: Secondary | ICD-10-CM | POA: Diagnosis not present

## 2018-04-28 DIAGNOSIS — D689 Coagulation defect, unspecified: Secondary | ICD-10-CM | POA: Diagnosis not present

## 2018-04-28 DIAGNOSIS — J158 Pneumonia due to other specified bacteria: Secondary | ICD-10-CM | POA: Diagnosis not present

## 2018-04-28 DIAGNOSIS — N2581 Secondary hyperparathyroidism of renal origin: Secondary | ICD-10-CM | POA: Diagnosis not present

## 2018-04-28 DIAGNOSIS — D509 Iron deficiency anemia, unspecified: Secondary | ICD-10-CM | POA: Diagnosis not present

## 2018-04-28 DIAGNOSIS — N186 End stage renal disease: Secondary | ICD-10-CM | POA: Diagnosis not present

## 2018-04-28 DIAGNOSIS — Z992 Dependence on renal dialysis: Secondary | ICD-10-CM | POA: Diagnosis not present

## 2018-04-30 DIAGNOSIS — D689 Coagulation defect, unspecified: Secondary | ICD-10-CM | POA: Diagnosis not present

## 2018-04-30 DIAGNOSIS — N2581 Secondary hyperparathyroidism of renal origin: Secondary | ICD-10-CM | POA: Diagnosis not present

## 2018-04-30 DIAGNOSIS — J158 Pneumonia due to other specified bacteria: Secondary | ICD-10-CM | POA: Diagnosis not present

## 2018-04-30 DIAGNOSIS — D509 Iron deficiency anemia, unspecified: Secondary | ICD-10-CM | POA: Diagnosis not present

## 2018-04-30 DIAGNOSIS — N186 End stage renal disease: Secondary | ICD-10-CM | POA: Diagnosis not present

## 2018-04-30 DIAGNOSIS — Z992 Dependence on renal dialysis: Secondary | ICD-10-CM | POA: Diagnosis not present

## 2018-05-03 DIAGNOSIS — D509 Iron deficiency anemia, unspecified: Secondary | ICD-10-CM | POA: Diagnosis not present

## 2018-05-03 DIAGNOSIS — N2581 Secondary hyperparathyroidism of renal origin: Secondary | ICD-10-CM | POA: Diagnosis not present

## 2018-05-03 DIAGNOSIS — D689 Coagulation defect, unspecified: Secondary | ICD-10-CM | POA: Diagnosis not present

## 2018-05-03 DIAGNOSIS — Z992 Dependence on renal dialysis: Secondary | ICD-10-CM | POA: Diagnosis not present

## 2018-05-03 DIAGNOSIS — N186 End stage renal disease: Secondary | ICD-10-CM | POA: Diagnosis not present

## 2018-05-03 DIAGNOSIS — J158 Pneumonia due to other specified bacteria: Secondary | ICD-10-CM | POA: Diagnosis not present

## 2018-05-05 DIAGNOSIS — N186 End stage renal disease: Secondary | ICD-10-CM | POA: Diagnosis not present

## 2018-05-05 DIAGNOSIS — N2581 Secondary hyperparathyroidism of renal origin: Secondary | ICD-10-CM | POA: Diagnosis not present

## 2018-05-05 DIAGNOSIS — J158 Pneumonia due to other specified bacteria: Secondary | ICD-10-CM | POA: Diagnosis not present

## 2018-05-05 DIAGNOSIS — D509 Iron deficiency anemia, unspecified: Secondary | ICD-10-CM | POA: Diagnosis not present

## 2018-05-05 DIAGNOSIS — D631 Anemia in chronic kidney disease: Secondary | ICD-10-CM | POA: Diagnosis not present

## 2018-05-05 DIAGNOSIS — I12 Hypertensive chronic kidney disease with 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: Secondary | ICD-10-CM | POA: Diagnosis not present

## 2018-05-05 DIAGNOSIS — Z992 Dependence on renal dialysis: Secondary | ICD-10-CM | POA: Diagnosis not present

## 2018-05-07 DIAGNOSIS — D509 Iron deficiency anemia, unspecified: Secondary | ICD-10-CM | POA: Diagnosis not present

## 2018-05-07 DIAGNOSIS — N186 End stage renal disease: Secondary | ICD-10-CM | POA: Diagnosis not present

## 2018-05-07 DIAGNOSIS — N2581 Secondary hyperparathyroidism of renal origin: Secondary | ICD-10-CM | POA: Diagnosis not present

## 2018-05-07 DIAGNOSIS — D631 Anemia in chronic kidney disease: Secondary | ICD-10-CM | POA: Diagnosis not present

## 2018-05-07 DIAGNOSIS — Z992 Dependence on renal dialysis: Secondary | ICD-10-CM | POA: Diagnosis not present

## 2018-05-07 DIAGNOSIS — J158 Pneumonia due to other specified bacteria: Secondary | ICD-10-CM | POA: Diagnosis not present

## 2018-05-10 DIAGNOSIS — Z992 Dependence on renal dialysis: Secondary | ICD-10-CM | POA: Diagnosis not present

## 2018-05-10 DIAGNOSIS — D509 Iron deficiency anemia, unspecified: Secondary | ICD-10-CM | POA: Diagnosis not present

## 2018-05-10 DIAGNOSIS — N2581 Secondary hyperparathyroidism of renal origin: Secondary | ICD-10-CM | POA: Diagnosis not present

## 2018-05-10 DIAGNOSIS — D631 Anemia in chronic kidney disease: Secondary | ICD-10-CM | POA: Diagnosis not present

## 2018-05-10 DIAGNOSIS — N186 End stage renal disease: Secondary | ICD-10-CM | POA: Diagnosis not present

## 2018-05-10 DIAGNOSIS — J158 Pneumonia due to other specified bacteria: Secondary | ICD-10-CM | POA: Diagnosis not present

## 2018-05-12 DIAGNOSIS — Z992 Dependence on renal dialysis: Secondary | ICD-10-CM | POA: Diagnosis not present

## 2018-05-12 DIAGNOSIS — J158 Pneumonia due to other specified bacteria: Secondary | ICD-10-CM | POA: Diagnosis not present

## 2018-05-12 DIAGNOSIS — N186 End stage renal disease: Secondary | ICD-10-CM | POA: Diagnosis not present

## 2018-05-12 DIAGNOSIS — D631 Anemia in chronic kidney disease: Secondary | ICD-10-CM | POA: Diagnosis not present

## 2018-05-12 DIAGNOSIS — D509 Iron deficiency anemia, unspecified: Secondary | ICD-10-CM | POA: Diagnosis not present

## 2018-05-12 DIAGNOSIS — N2581 Secondary hyperparathyroidism of renal origin: Secondary | ICD-10-CM | POA: Diagnosis not present

## 2018-05-14 DIAGNOSIS — N186 End stage renal disease: Secondary | ICD-10-CM | POA: Diagnosis not present

## 2018-05-14 DIAGNOSIS — D509 Iron deficiency anemia, unspecified: Secondary | ICD-10-CM | POA: Diagnosis not present

## 2018-05-14 DIAGNOSIS — N2581 Secondary hyperparathyroidism of renal origin: Secondary | ICD-10-CM | POA: Diagnosis not present

## 2018-05-14 DIAGNOSIS — J158 Pneumonia due to other specified bacteria: Secondary | ICD-10-CM | POA: Diagnosis not present

## 2018-05-14 DIAGNOSIS — D631 Anemia in chronic kidney disease: Secondary | ICD-10-CM | POA: Diagnosis not present

## 2018-05-14 DIAGNOSIS — Z992 Dependence on renal dialysis: Secondary | ICD-10-CM | POA: Diagnosis not present

## 2018-05-17 DIAGNOSIS — N186 End stage renal disease: Secondary | ICD-10-CM | POA: Diagnosis not present

## 2018-05-17 DIAGNOSIS — N2581 Secondary hyperparathyroidism of renal origin: Secondary | ICD-10-CM | POA: Diagnosis not present

## 2018-05-17 DIAGNOSIS — J158 Pneumonia due to other specified bacteria: Secondary | ICD-10-CM | POA: Diagnosis not present

## 2018-05-17 DIAGNOSIS — Z992 Dependence on renal dialysis: Secondary | ICD-10-CM | POA: Diagnosis not present

## 2018-05-17 DIAGNOSIS — D509 Iron deficiency anemia, unspecified: Secondary | ICD-10-CM | POA: Diagnosis not present

## 2018-05-17 DIAGNOSIS — D631 Anemia in chronic kidney disease: Secondary | ICD-10-CM | POA: Diagnosis not present

## 2018-05-19 DIAGNOSIS — Z992 Dependence on renal dialysis: Secondary | ICD-10-CM | POA: Diagnosis not present

## 2018-05-19 DIAGNOSIS — N2581 Secondary hyperparathyroidism of renal origin: Secondary | ICD-10-CM | POA: Diagnosis not present

## 2018-05-19 DIAGNOSIS — D631 Anemia in chronic kidney disease: Secondary | ICD-10-CM | POA: Diagnosis not present

## 2018-05-19 DIAGNOSIS — D509 Iron deficiency anemia, unspecified: Secondary | ICD-10-CM | POA: Diagnosis not present

## 2018-05-19 DIAGNOSIS — N186 End stage renal disease: Secondary | ICD-10-CM | POA: Diagnosis not present

## 2018-05-19 DIAGNOSIS — J158 Pneumonia due to other specified bacteria: Secondary | ICD-10-CM | POA: Diagnosis not present

## 2018-05-21 DIAGNOSIS — J158 Pneumonia due to other specified bacteria: Secondary | ICD-10-CM | POA: Diagnosis not present

## 2018-05-21 DIAGNOSIS — Z992 Dependence on renal dialysis: Secondary | ICD-10-CM | POA: Diagnosis not present

## 2018-05-21 DIAGNOSIS — D631 Anemia in chronic kidney disease: Secondary | ICD-10-CM | POA: Diagnosis not present

## 2018-05-21 DIAGNOSIS — N2581 Secondary hyperparathyroidism of renal origin: Secondary | ICD-10-CM | POA: Diagnosis not present

## 2018-05-21 DIAGNOSIS — N186 End stage renal disease: Secondary | ICD-10-CM | POA: Diagnosis not present

## 2018-05-21 DIAGNOSIS — D509 Iron deficiency anemia, unspecified: Secondary | ICD-10-CM | POA: Diagnosis not present

## 2018-05-24 DIAGNOSIS — J158 Pneumonia due to other specified bacteria: Secondary | ICD-10-CM | POA: Diagnosis not present

## 2018-05-24 DIAGNOSIS — N186 End stage renal disease: Secondary | ICD-10-CM | POA: Diagnosis not present

## 2018-05-24 DIAGNOSIS — D509 Iron deficiency anemia, unspecified: Secondary | ICD-10-CM | POA: Diagnosis not present

## 2018-05-24 DIAGNOSIS — D631 Anemia in chronic kidney disease: Secondary | ICD-10-CM | POA: Diagnosis not present

## 2018-05-24 DIAGNOSIS — Z992 Dependence on renal dialysis: Secondary | ICD-10-CM | POA: Diagnosis not present

## 2018-05-24 DIAGNOSIS — N2581 Secondary hyperparathyroidism of renal origin: Secondary | ICD-10-CM | POA: Diagnosis not present

## 2018-05-26 DIAGNOSIS — Z992 Dependence on renal dialysis: Secondary | ICD-10-CM | POA: Diagnosis not present

## 2018-05-26 DIAGNOSIS — N2581 Secondary hyperparathyroidism of renal origin: Secondary | ICD-10-CM | POA: Diagnosis not present

## 2018-05-26 DIAGNOSIS — D509 Iron deficiency anemia, unspecified: Secondary | ICD-10-CM | POA: Diagnosis not present

## 2018-05-26 DIAGNOSIS — N186 End stage renal disease: Secondary | ICD-10-CM | POA: Diagnosis not present

## 2018-05-26 DIAGNOSIS — D631 Anemia in chronic kidney disease: Secondary | ICD-10-CM | POA: Diagnosis not present

## 2018-05-26 DIAGNOSIS — J158 Pneumonia due to other specified bacteria: Secondary | ICD-10-CM | POA: Diagnosis not present

## 2018-05-28 DIAGNOSIS — D509 Iron deficiency anemia, unspecified: Secondary | ICD-10-CM | POA: Diagnosis not present

## 2018-05-28 DIAGNOSIS — D631 Anemia in chronic kidney disease: Secondary | ICD-10-CM | POA: Diagnosis not present

## 2018-05-28 DIAGNOSIS — J158 Pneumonia due to other specified bacteria: Secondary | ICD-10-CM | POA: Diagnosis not present

## 2018-05-28 DIAGNOSIS — N2581 Secondary hyperparathyroidism of renal origin: Secondary | ICD-10-CM | POA: Diagnosis not present

## 2018-05-28 DIAGNOSIS — Z992 Dependence on renal dialysis: Secondary | ICD-10-CM | POA: Diagnosis not present

## 2018-05-28 DIAGNOSIS — N186 End stage renal disease: Secondary | ICD-10-CM | POA: Diagnosis not present

## 2018-05-31 DIAGNOSIS — Z992 Dependence on renal dialysis: Secondary | ICD-10-CM | POA: Diagnosis not present

## 2018-05-31 DIAGNOSIS — D631 Anemia in chronic kidney disease: Secondary | ICD-10-CM | POA: Diagnosis not present

## 2018-05-31 DIAGNOSIS — N2581 Secondary hyperparathyroidism of renal origin: Secondary | ICD-10-CM | POA: Diagnosis not present

## 2018-05-31 DIAGNOSIS — D509 Iron deficiency anemia, unspecified: Secondary | ICD-10-CM | POA: Diagnosis not present

## 2018-05-31 DIAGNOSIS — J158 Pneumonia due to other specified bacteria: Secondary | ICD-10-CM | POA: Diagnosis not present

## 2018-05-31 DIAGNOSIS — N186 End stage renal disease: Secondary | ICD-10-CM | POA: Diagnosis not present

## 2018-06-02 DIAGNOSIS — D509 Iron deficiency anemia, unspecified: Secondary | ICD-10-CM | POA: Diagnosis not present

## 2018-06-02 DIAGNOSIS — D631 Anemia in chronic kidney disease: Secondary | ICD-10-CM | POA: Diagnosis not present

## 2018-06-02 DIAGNOSIS — J158 Pneumonia due to other specified bacteria: Secondary | ICD-10-CM | POA: Diagnosis not present

## 2018-06-02 DIAGNOSIS — Z992 Dependence on renal dialysis: Secondary | ICD-10-CM | POA: Diagnosis not present

## 2018-06-02 DIAGNOSIS — N186 End stage renal disease: Secondary | ICD-10-CM | POA: Diagnosis not present

## 2018-06-02 DIAGNOSIS — N2581 Secondary hyperparathyroidism of renal origin: Secondary | ICD-10-CM | POA: Diagnosis not present

## 2018-06-04 DIAGNOSIS — N2581 Secondary hyperparathyroidism of renal origin: Secondary | ICD-10-CM | POA: Diagnosis not present

## 2018-06-04 DIAGNOSIS — D631 Anemia in chronic kidney disease: Secondary | ICD-10-CM | POA: Diagnosis not present

## 2018-06-04 DIAGNOSIS — D689 Coagulation defect, unspecified: Secondary | ICD-10-CM | POA: Diagnosis not present

## 2018-06-04 DIAGNOSIS — I12 Hypertensive chronic kidney disease with 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: Secondary | ICD-10-CM | POA: Diagnosis not present

## 2018-06-04 DIAGNOSIS — N186 End stage renal disease: Secondary | ICD-10-CM | POA: Diagnosis not present

## 2018-06-04 DIAGNOSIS — D509 Iron deficiency anemia, unspecified: Secondary | ICD-10-CM | POA: Diagnosis not present

## 2018-06-04 DIAGNOSIS — J158 Pneumonia due to other specified bacteria: Secondary | ICD-10-CM | POA: Diagnosis not present

## 2018-06-04 DIAGNOSIS — Z992 Dependence on renal dialysis: Secondary | ICD-10-CM | POA: Diagnosis not present

## 2018-06-07 DIAGNOSIS — Z992 Dependence on renal dialysis: Secondary | ICD-10-CM | POA: Diagnosis not present

## 2018-06-07 DIAGNOSIS — D509 Iron deficiency anemia, unspecified: Secondary | ICD-10-CM | POA: Diagnosis not present

## 2018-06-07 DIAGNOSIS — N186 End stage renal disease: Secondary | ICD-10-CM | POA: Diagnosis not present

## 2018-06-07 DIAGNOSIS — D689 Coagulation defect, unspecified: Secondary | ICD-10-CM | POA: Diagnosis not present

## 2018-06-07 DIAGNOSIS — J158 Pneumonia due to other specified bacteria: Secondary | ICD-10-CM | POA: Diagnosis not present

## 2018-06-07 DIAGNOSIS — N2581 Secondary hyperparathyroidism of renal origin: Secondary | ICD-10-CM | POA: Diagnosis not present

## 2018-06-09 DIAGNOSIS — N186 End stage renal disease: Secondary | ICD-10-CM | POA: Diagnosis not present

## 2018-06-09 DIAGNOSIS — N2581 Secondary hyperparathyroidism of renal origin: Secondary | ICD-10-CM | POA: Diagnosis not present

## 2018-06-09 DIAGNOSIS — D509 Iron deficiency anemia, unspecified: Secondary | ICD-10-CM | POA: Diagnosis not present

## 2018-06-09 DIAGNOSIS — Z992 Dependence on renal dialysis: Secondary | ICD-10-CM | POA: Diagnosis not present

## 2018-06-09 DIAGNOSIS — J158 Pneumonia due to other specified bacteria: Secondary | ICD-10-CM | POA: Diagnosis not present

## 2018-06-09 DIAGNOSIS — D689 Coagulation defect, unspecified: Secondary | ICD-10-CM | POA: Diagnosis not present

## 2018-06-11 DIAGNOSIS — N2581 Secondary hyperparathyroidism of renal origin: Secondary | ICD-10-CM | POA: Diagnosis not present

## 2018-06-11 DIAGNOSIS — N186 End stage renal disease: Secondary | ICD-10-CM | POA: Diagnosis not present

## 2018-06-11 DIAGNOSIS — D509 Iron deficiency anemia, unspecified: Secondary | ICD-10-CM | POA: Diagnosis not present

## 2018-06-11 DIAGNOSIS — J158 Pneumonia due to other specified bacteria: Secondary | ICD-10-CM | POA: Diagnosis not present

## 2018-06-11 DIAGNOSIS — D689 Coagulation defect, unspecified: Secondary | ICD-10-CM | POA: Diagnosis not present

## 2018-06-11 DIAGNOSIS — Z992 Dependence on renal dialysis: Secondary | ICD-10-CM | POA: Diagnosis not present

## 2018-06-14 DIAGNOSIS — D689 Coagulation defect, unspecified: Secondary | ICD-10-CM | POA: Diagnosis not present

## 2018-06-14 DIAGNOSIS — N186 End stage renal disease: Secondary | ICD-10-CM | POA: Diagnosis not present

## 2018-06-14 DIAGNOSIS — Z992 Dependence on renal dialysis: Secondary | ICD-10-CM | POA: Diagnosis not present

## 2018-06-14 DIAGNOSIS — J158 Pneumonia due to other specified bacteria: Secondary | ICD-10-CM | POA: Diagnosis not present

## 2018-06-14 DIAGNOSIS — N2581 Secondary hyperparathyroidism of renal origin: Secondary | ICD-10-CM | POA: Diagnosis not present

## 2018-06-14 DIAGNOSIS — D509 Iron deficiency anemia, unspecified: Secondary | ICD-10-CM | POA: Diagnosis not present

## 2018-06-16 DIAGNOSIS — N2581 Secondary hyperparathyroidism of renal origin: Secondary | ICD-10-CM | POA: Diagnosis not present

## 2018-06-16 DIAGNOSIS — N186 End stage renal disease: Secondary | ICD-10-CM | POA: Diagnosis not present

## 2018-06-16 DIAGNOSIS — J158 Pneumonia due to other specified bacteria: Secondary | ICD-10-CM | POA: Diagnosis not present

## 2018-06-16 DIAGNOSIS — D689 Coagulation defect, unspecified: Secondary | ICD-10-CM | POA: Diagnosis not present

## 2018-06-16 DIAGNOSIS — Z992 Dependence on renal dialysis: Secondary | ICD-10-CM | POA: Diagnosis not present

## 2018-06-16 DIAGNOSIS — D509 Iron deficiency anemia, unspecified: Secondary | ICD-10-CM | POA: Diagnosis not present

## 2018-06-18 DIAGNOSIS — D509 Iron deficiency anemia, unspecified: Secondary | ICD-10-CM | POA: Diagnosis not present

## 2018-06-18 DIAGNOSIS — D689 Coagulation defect, unspecified: Secondary | ICD-10-CM | POA: Diagnosis not present

## 2018-06-18 DIAGNOSIS — N2581 Secondary hyperparathyroidism of renal origin: Secondary | ICD-10-CM | POA: Diagnosis not present

## 2018-06-18 DIAGNOSIS — N186 End stage renal disease: Secondary | ICD-10-CM | POA: Diagnosis not present

## 2018-06-18 DIAGNOSIS — J158 Pneumonia due to other specified bacteria: Secondary | ICD-10-CM | POA: Diagnosis not present

## 2018-06-18 DIAGNOSIS — Z992 Dependence on renal dialysis: Secondary | ICD-10-CM | POA: Diagnosis not present

## 2018-06-21 DIAGNOSIS — N186 End stage renal disease: Secondary | ICD-10-CM | POA: Diagnosis not present

## 2018-06-21 DIAGNOSIS — D689 Coagulation defect, unspecified: Secondary | ICD-10-CM | POA: Diagnosis not present

## 2018-06-21 DIAGNOSIS — D509 Iron deficiency anemia, unspecified: Secondary | ICD-10-CM | POA: Diagnosis not present

## 2018-06-21 DIAGNOSIS — N2581 Secondary hyperparathyroidism of renal origin: Secondary | ICD-10-CM | POA: Diagnosis not present

## 2018-06-21 DIAGNOSIS — J158 Pneumonia due to other specified bacteria: Secondary | ICD-10-CM | POA: Diagnosis not present

## 2018-06-21 DIAGNOSIS — Z992 Dependence on renal dialysis: Secondary | ICD-10-CM | POA: Diagnosis not present

## 2018-06-23 DIAGNOSIS — D689 Coagulation defect, unspecified: Secondary | ICD-10-CM | POA: Diagnosis not present

## 2018-06-23 DIAGNOSIS — D509 Iron deficiency anemia, unspecified: Secondary | ICD-10-CM | POA: Diagnosis not present

## 2018-06-23 DIAGNOSIS — J158 Pneumonia due to other specified bacteria: Secondary | ICD-10-CM | POA: Diagnosis not present

## 2018-06-23 DIAGNOSIS — N186 End stage renal disease: Secondary | ICD-10-CM | POA: Diagnosis not present

## 2018-06-23 DIAGNOSIS — Z992 Dependence on renal dialysis: Secondary | ICD-10-CM | POA: Diagnosis not present

## 2018-06-23 DIAGNOSIS — N2581 Secondary hyperparathyroidism of renal origin: Secondary | ICD-10-CM | POA: Diagnosis not present

## 2018-06-25 DIAGNOSIS — J158 Pneumonia due to other specified bacteria: Secondary | ICD-10-CM | POA: Diagnosis not present

## 2018-06-25 DIAGNOSIS — N186 End stage renal disease: Secondary | ICD-10-CM | POA: Diagnosis not present

## 2018-06-25 DIAGNOSIS — D689 Coagulation defect, unspecified: Secondary | ICD-10-CM | POA: Diagnosis not present

## 2018-06-25 DIAGNOSIS — N2581 Secondary hyperparathyroidism of renal origin: Secondary | ICD-10-CM | POA: Diagnosis not present

## 2018-06-25 DIAGNOSIS — D509 Iron deficiency anemia, unspecified: Secondary | ICD-10-CM | POA: Diagnosis not present

## 2018-06-25 DIAGNOSIS — Z992 Dependence on renal dialysis: Secondary | ICD-10-CM | POA: Diagnosis not present

## 2018-06-28 DIAGNOSIS — N2581 Secondary hyperparathyroidism of renal origin: Secondary | ICD-10-CM | POA: Diagnosis not present

## 2018-06-28 DIAGNOSIS — J158 Pneumonia due to other specified bacteria: Secondary | ICD-10-CM | POA: Diagnosis not present

## 2018-06-28 DIAGNOSIS — D509 Iron deficiency anemia, unspecified: Secondary | ICD-10-CM | POA: Diagnosis not present

## 2018-06-28 DIAGNOSIS — Z992 Dependence on renal dialysis: Secondary | ICD-10-CM | POA: Diagnosis not present

## 2018-06-28 DIAGNOSIS — D689 Coagulation defect, unspecified: Secondary | ICD-10-CM | POA: Diagnosis not present

## 2018-06-28 DIAGNOSIS — N186 End stage renal disease: Secondary | ICD-10-CM | POA: Diagnosis not present

## 2018-06-30 DIAGNOSIS — J158 Pneumonia due to other specified bacteria: Secondary | ICD-10-CM | POA: Diagnosis not present

## 2018-06-30 DIAGNOSIS — N2581 Secondary hyperparathyroidism of renal origin: Secondary | ICD-10-CM | POA: Diagnosis not present

## 2018-06-30 DIAGNOSIS — D509 Iron deficiency anemia, unspecified: Secondary | ICD-10-CM | POA: Diagnosis not present

## 2018-06-30 DIAGNOSIS — Z992 Dependence on renal dialysis: Secondary | ICD-10-CM | POA: Diagnosis not present

## 2018-06-30 DIAGNOSIS — N186 End stage renal disease: Secondary | ICD-10-CM | POA: Diagnosis not present

## 2018-06-30 DIAGNOSIS — D689 Coagulation defect, unspecified: Secondary | ICD-10-CM | POA: Diagnosis not present

## 2018-07-02 DIAGNOSIS — N186 End stage renal disease: Secondary | ICD-10-CM | POA: Diagnosis not present

## 2018-07-02 DIAGNOSIS — N2581 Secondary hyperparathyroidism of renal origin: Secondary | ICD-10-CM | POA: Diagnosis not present

## 2018-07-02 DIAGNOSIS — D509 Iron deficiency anemia, unspecified: Secondary | ICD-10-CM | POA: Diagnosis not present

## 2018-07-02 DIAGNOSIS — J158 Pneumonia due to other specified bacteria: Secondary | ICD-10-CM | POA: Diagnosis not present

## 2018-07-02 DIAGNOSIS — D689 Coagulation defect, unspecified: Secondary | ICD-10-CM | POA: Diagnosis not present

## 2018-07-02 DIAGNOSIS — Z992 Dependence on renal dialysis: Secondary | ICD-10-CM | POA: Diagnosis not present

## 2018-07-05 DIAGNOSIS — R1110 Vomiting, unspecified: Secondary | ICD-10-CM | POA: Diagnosis not present

## 2018-07-05 DIAGNOSIS — I12 Hypertensive chronic kidney disease with 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: Secondary | ICD-10-CM | POA: Diagnosis not present

## 2018-07-05 DIAGNOSIS — N186 End stage renal disease: Secondary | ICD-10-CM | POA: Diagnosis not present

## 2018-07-05 DIAGNOSIS — Z992 Dependence on renal dialysis: Secondary | ICD-10-CM | POA: Diagnosis not present

## 2018-07-05 DIAGNOSIS — D631 Anemia in chronic kidney disease: Secondary | ICD-10-CM | POA: Diagnosis not present

## 2018-07-05 DIAGNOSIS — J158 Pneumonia due to other specified bacteria: Secondary | ICD-10-CM | POA: Diagnosis not present

## 2018-07-05 DIAGNOSIS — D509 Iron deficiency anemia, unspecified: Secondary | ICD-10-CM | POA: Diagnosis not present

## 2018-07-05 DIAGNOSIS — N2581 Secondary hyperparathyroidism of renal origin: Secondary | ICD-10-CM | POA: Diagnosis not present

## 2018-07-08 ENCOUNTER — Encounter: Payer: Self-pay | Admitting: Family Medicine

## 2018-07-08 ENCOUNTER — Ambulatory Visit (INDEPENDENT_AMBULATORY_CARE_PROVIDER_SITE_OTHER): Payer: Medicare Other | Admitting: Family Medicine

## 2018-07-08 ENCOUNTER — Other Ambulatory Visit: Payer: Self-pay

## 2018-07-08 VITALS — BP 146/92 | Temp 97.9°F | Wt 189.0 lb

## 2018-07-08 DIAGNOSIS — R1110 Vomiting, unspecified: Secondary | ICD-10-CM

## 2018-07-08 NOTE — Progress Notes (Addendum)
[facility] Phone: [phone]   cc: Nausea, vomiting  Subjective:  The patient states that he has been throwing up since Monday.  He was vomiting 6-7 times a day, 3 times today so far.  It is nonbloody nonbilious.  It is usually after meals sometimes up to an hour later.  He was able to keep down some ice chips and some ice tea earlier today.  He states that this is been an issue for him for 4 years off and on.  He believes the nausea and vomiting is from his dialysis treatment and he did not go to his Wednesday appointment because he was not feeling well.  On his Monday appointment he did not feel well after.  He made a pimento cheese sandwich and then vomited soon after.  Sometimes certain smells trigger his nausea.  Peppermint or saltine crackers sometimes help with the nausea.  He has not tried any medications to help.  He has not had any fevers.  He had some  loose stools Monday night that were nonbloody but has not had any since. He has not had any abdominal pain during this time.   He states he used to go to [facility] for his nausea and vomiting but states that they told him he can no longer a patient there after he stated he he did not want this certain nurse practitioner to treat him after he felt like they had accused him of being drug-seeking.  He denies abdominal pain, hematemesis, dizziness, SOB, bloody stools, chest pain, cough.   ROS: See HPI for pertinent positives and negatives  Past Medical History  Family history reviewed for today's visit. No changes.   Objective: BP (!) 146/92    Temp 97.9 F (36.6 C) (Oral)    Wt 189 lb (85.7 kg)    BMI 31.45 kg/m  Gen: NAD, alert and oriented,  HEENT:  MMM CV: normal rate, regular rhythm. No murmurs, no rubs.  Resp: LCTAB, no wheezes, crackles. normal work of breathing GI: nontender to palpation, BS present, no guarding or organomegaly Skin: No rashes, no lesions Psych: Eye contact is poor.  Patient  appears agitated throughout most of encounter, increasing agitation when discussing the disagreements he had with previous NP at [facility].  Assessment/Plan: Chronic vomiting This is an acute worsening of a chronic condition for which the patient had been seeing [facility] gastroenterology.  Patient states he is no longer the patient there and would like a referral to different gastroenterologist. Unable to view [facility] notes through epic, although a consult note from a 2019 hospital admission stated that his o/p workup for nausea vomiting included a colonoscopy, egd, gastric emptying test, and ultrasound, all of which were negative for a cause of vomiting . Patient's weight is slightly up from last measure.  On review his weight appears to fluctuate between 160 and 200 pounds for the past several years.  Patient afebrile today, with no signs of infectious gastritis/gastroenteritis other than one episode of loose stools Monday. Given that his vomiting started a few hours after his Monday dialysis appointment it is very unlikely that this vomiting is related to uremia, as the toxins leading to symptoms of uremia had just been removed by dialysis.  Also, given that his symptoms did not worsen the next day, or the two days after that, even after he decided to skip his Wednesday session, it would appear this is unrelated to his ESRD, especially in the context  of a lack of any other symptoms/signs of uremia/fluid overload from ESRD including lack of dyspnea,and  no changes in mental status.   It appears that there is a large psychogenic component to this patient's vomiting.  On chart review, the patient has a well documented history of anxiety surrounding his dialysis treatment, to the extent that he used to take benzodiazepines the night prior to his dialysis treatments. This correlates with statements the patient made during this encounter in which he stated he thought the dialysis is what initially caused his  vomiting symptoms several years ago. The patient's decreased tone in the lower esophageal sphincter secondary to his GERD makes him predisposed to episodes of regurgitation which could be exacerbated by his anxiety surrounding his dialysis leading to this most recent episode of vomiting. There could be a component of gastroparesis/delayed gastric emptying but patient does not carry a diagnosis of diabetes and is not currently taking any scheduled medications that carry this as a side effect.  Patient is also overdue for colonoscopy,  which he was supposed to receive in 2018 after a 2013 colonoscopy showed tubular adenomas.. -Refer to the [facility] for colonoscopy and further evaluation of vomiting if necessary. -Advised patient to continue to go to dialysis appointments. - Did not prescribe antinausea medicine such as Zofran, given that this is a chronic issue for him and unlikely to resolve soon and trying to avoid chronic use of these medications.  Clemetine Marker, MD PGY-1

## 2018-07-08 NOTE — Patient Instructions (Signed)
I have sent in a referral to [facility].  Someone will call you to help you schedule an appointment for your colonoscopy as well as your discuss your persistent nausea and vomiting.  If it starts to get worse before your appointment, call us and we can get you some medication that will hopefully help your nausea temporarily while you await your appointment.    It was great to meet you today,   Clemetine Marker, MD

## 2018-07-09 DIAGNOSIS — N186 End stage renal disease: Secondary | ICD-10-CM | POA: Diagnosis not present

## 2018-07-09 DIAGNOSIS — D631 Anemia in chronic kidney disease: Secondary | ICD-10-CM | POA: Diagnosis not present

## 2018-07-09 DIAGNOSIS — D509 Iron deficiency anemia, unspecified: Secondary | ICD-10-CM | POA: Diagnosis not present

## 2018-07-09 DIAGNOSIS — N2581 Secondary hyperparathyroidism of renal origin: Secondary | ICD-10-CM | POA: Diagnosis not present

## 2018-07-09 DIAGNOSIS — J158 Pneumonia due to other specified bacteria: Secondary | ICD-10-CM | POA: Diagnosis not present

## 2018-07-09 DIAGNOSIS — Z992 Dependence on renal dialysis: Secondary | ICD-10-CM | POA: Diagnosis not present

## 2018-07-11 ENCOUNTER — Other Ambulatory Visit: Payer: Self-pay | Admitting: Cardiology

## 2018-07-11 DIAGNOSIS — R1110 Vomiting, unspecified: Secondary | ICD-10-CM | POA: Insufficient documentation

## 2018-07-11 DIAGNOSIS — I10 Essential (primary) hypertension: Secondary | ICD-10-CM

## 2018-07-11 NOTE — Assessment & Plan Note (Addendum)
This is an acute worsening of a chronic condition for which the patient had been seeing [facility] gastroenterology.  Patient states he is no longer the patient there and would like a referral to different gastroenterologist. Unable to view [facility] notes through epic, although a consult note from a 2019 hospital admission stated that his o/p workup for nausea vomiting included a colonoscopy, egd, gastric emptying test, and ultrasound, all of which were negative for a cause of vomiting . Patient's weight is slightly up from last measure.  On review his weight appears to fluctuate between 160 and 200 pounds for the past several years.  Patient afebrile today, with no signs of infectious gastritis/gastroenteritis other than one episode of loose stools Monday. Given that his vomiting started a few hours after his Monday dialysis appointment it is very unlikely that this vomiting is related to uremia, as the toxins leading to symptoms of uremia had just been removed by dialysis.  Also, given that his symptoms did not worsen the next day, or the two days after that, even after he decided to skip his Wednesday session, it would appear this is unrelated to his ESRD, especially in the context of a lack of any other symptoms/signs of uremia/fluid overload from ESRD including lack of dyspnea,and  no changes in mental status.   It appears that there is a large psychogenic component to this patient's vomiting.  On chart review, the patient has a well documented history of anxiety surrounding his dialysis treatment, to the extent that he used to take benzodiazepines the night prior to his dialysis treatments. This correlates with statements the patient made during this encounter in which he stated he thought the dialysis is what initially caused his vomiting symptoms several years ago. The patient's decreased tone in the lower esophageal sphincter secondary to his GERD makes him predisposed to episodes of regurgitation which could  be exacerbated by his anxiety surrounding his dialysis leading to this most recent episode of vomiting. There could be a component of gastroparesis/delayed gastric emptying but patient does not carry a diagnosis of diabetes and is not currently taking any scheduled medications that carry this as a side effect.  Patient is also overdue for colonoscopy,  which he was supposed to receive in 2018 after a 2013 colonoscopy showed tubular adenomas.. -Refer to the [facility] for colonoscopy and further evaluation of vomiting if necessary. -Advised patient to continue to go to dialysis appointments. - Did not prescribe antinausea medicine such as Zofran, given that this is a chronic issue for him and unlikely to resolve soon and trying to avoid chronic use of these medications.

## 2018-07-12 ENCOUNTER — Other Ambulatory Visit: Payer: Self-pay

## 2018-07-12 ENCOUNTER — Ambulatory Visit (INDEPENDENT_AMBULATORY_CARE_PROVIDER_SITE_OTHER): Payer: Medicare Other | Admitting: Gastroenterology

## 2018-07-12 ENCOUNTER — Encounter: Payer: Self-pay | Admitting: Gastroenterology

## 2018-07-12 VITALS — Ht 65.0 in

## 2018-07-12 DIAGNOSIS — Z992 Dependence on renal dialysis: Secondary | ICD-10-CM | POA: Diagnosis not present

## 2018-07-12 DIAGNOSIS — D509 Iron deficiency anemia, unspecified: Secondary | ICD-10-CM | POA: Diagnosis not present

## 2018-07-12 DIAGNOSIS — N2581 Secondary hyperparathyroidism of renal origin: Secondary | ICD-10-CM | POA: Diagnosis not present

## 2018-07-12 DIAGNOSIS — N186 End stage renal disease: Secondary | ICD-10-CM | POA: Diagnosis not present

## 2018-07-12 DIAGNOSIS — J158 Pneumonia due to other specified bacteria: Secondary | ICD-10-CM | POA: Diagnosis not present

## 2018-07-12 DIAGNOSIS — R112 Nausea with vomiting, unspecified: Secondary | ICD-10-CM | POA: Diagnosis not present

## 2018-07-12 DIAGNOSIS — D631 Anemia in chronic kidney disease: Secondary | ICD-10-CM | POA: Diagnosis not present

## 2018-07-12 MED ORDER — PANTOPRAZOLE SODIUM 40 MG PO TBEC: 40.0000 mg | DELAYED_RELEASE_TABLET | Freq: Two times a day (BID) | ORAL | 3 refills | Status: DC

## 2018-07-12 NOTE — Progress Notes (Addendum)
TELEHEALTH VISIT  Referring Provider: Martyn Malay, MD Primary Care Physician:  Sherene Sires, DO   Tele-visit due to COVID-19 pandemic Patient requested visit virtually, consented to the virtual encounter via audio enabled telemedicine application (Doxim[employer]) Contact made at: 11:31 07/12/18 Patient verified by name and date of birth Location of patient: Home Location provider: [place] [facility] Names of persons participating: Me, patient, [name] CMA Time spent on telehealth visit: 35 minutes I discussed the limitations of evaluation and management by telemedicine. The patient expressed understanding and agreed to proceed.  Reason for Consultation:  vomiting   IMPRESSION:  Intermittent nausea and vomiting x4 years    - prior work up includes colonoscopy, EGD, CT, GES, and ultrasound    - on omeprazole 20 mg daily ? Cryptogenic cirrhosis    - unclear how diagnosis was made    - platelets 206,000    - echogenic liver noted on ultrasound Microcytic anemia with a hemoglobin of 8.6 History of colon polyps on colonoscopy 2013    - tubular adenoma 2013    - surveillance recommended 2018    - he had a colonoscopy last year, results not available ESRD on hemodialysis x 11 year (Dr. Carmina Miller) No diabetes  Proceed with UGI/SBFT to evaluate for possible etiologies of his nausea and vomiting. Differential includes GERD, non-ulcer dyspepsia, celiac disease, gastric outlet obstruction, gastroparesis, functional nausea/vomigint and vomiting, and less likely bacterial overgrowth. Prior imaging notes a normal pancreas and biliary tree, He does meet criteria for cyclical vomiting syndrome. No obvious medications or concurrent constipation. He does not have diabetes.   He thinks his symptoms are related to his dialysis, however, the nephrologist has not provided him with reassurance that this is the case and his symptoms occur daily without temporal relationship to dialysis. Nausea is  frequent in dialysis patients. We could consider a trial of haloperidol 0.25 to 0.72 mg po QHS, increasing to BID and then TID PRN or ondansetron 4-8 mg po BID PRN.   I will work to obtain records from Dr. Paulita Fujita regarding his prior evaluation and diagnosis of cirrhosis.   PLAN: Increase pantoprazole to 40 mg BID Iron panel Minimize medications associated with nausea UGI/SBFT series EGD and colonoscopy Please ask the dialysis center for his dry weight over the last year Obtain records from [place] (office visits/consults, procedure notes, pathology results, labs, and imaging studies) to clarify diagnosis of cryptogenic cirrhosis Low threshold to proceed with EGD and colonoscopy given his microscopic anemia  I consented the patient discussing the risks, benefits, and alternatives to endoscopic evaluation. In particular, we discussed the risks that include, but are not limited to, reaction to medication, cardiopulmonary compromise, bleeding requiring blood transfusion, aspiration resulting in pneumonia, perforation requiring surgery, lack of diagnosis, severe illness requiring hospitalization, and even death. We reviewed the risk of missed lesion including polyps or even cancer. The patient acknowledges these risks and asks that we proceed.   HPI: James Wall is a 53 y.o. male referred by Dr. Criss Rosales for further evaluation of vomiting.  The history is obtained through the patient and review of his electronic health record.  Previously followed by [facility] (Dr. Paulita Fujita for 4-5 years) so all of the records are not available to me at this time. Patient requested new gastroenterologist as he didn't feel he was getting any follow-up after his colonscopy. He has cryptogenic cirrhosis (no alcohol use, negative serology for hepatitis B and hepatitis C, ASMA and AMA negative, unremarkable ferritin,  no varices on prior endoscopy) but there is no history of decompensation.  ESRD on dialysis for 11 years  and anxious related to dialysis requiring benzodiazepines prior to dialysis. He has significant cramping while on the dialysis machine. He doesn't feel that he completes only 80 percent of his treatments.   4-year history of intermittent nausea and vomiting worsened after his mother's death 3 years ago on Christmas morning. Worse over the last week. Having 5-6 episodes daily. Rare days where he is asymptomatic, No identified food triggers except for tomato products.  Patient has previously attributed his symptoms to his dialysis treatment.  No odynophagia or dysphagia. No abdominal pain. No change in bowel habits. Has one bowel movement BID to QOD.  Outpatient workup has included a colonoscopy, egd, gastric emptying test, and ultrasound, all of which were negative for a cause of vomiting. He had tubular adenomas on a colonoscopy in 2013. Surveillance recommended in 5 years. He feels that his weight is down. Records available to me show that his weight appears to fluctuate between 160 and 200 pounds for the past several years. No known diabetes.  Dr. Paulita Fujita gave him ondansetron but this is not improving his symptoms.   No NSAIDs. Avoids carbonated beverages and spicy/greasy foods. On a PPI for the last 3-4 years, currently on omeprazole 40 mg daily.   Labs from 12/25/2017 show a normal CMP.  Albumin 2.7.  Hemoglobin 8.6, MCV 91.3, RDW 20.1, platelets 206. Hemoglobin ranges from 8.2-10.2 over the last two years.   CT of the abdomen pelvis without contrast 03/08/2017 showed active pneumonia, changes of COPD, cardiomegaly,  bilateral inguinal hernias and a small umbilical hernia. An abdominal ultrasound 03/14/2017 showed an echogenic liver.  No other abdominal findings.  A gastric emptying scan was normal 10/30/2011.  Brother having stomach problems. His sister is having nearly identical symptoms but she is also having constipation.   No known family history of colon cancer or polyps. No family history of  uterine/endometrial cancer, pancreatic cancer or gastric/stomach cancer.  Past Medical History:  Diagnosis Date  . Anemia   . Anxiety   . Arthritis    GOUT - pt not sure if this is true  . AV fistula occlusion ([place]) 10/2015  . Cirrhosis, nonalcoholic ([place])   . COPD (chronic obstructive pulmonary disease) ([place])   . Depression   . ESRD on hemodialysis [facility])    Started HD Jan 2009.  ESRD was due to HTN.  Dx'd with HTN in hospital 1996 according to pt, they had to keep him so he could get Medicaid to afford the BP medications.  First saw a nephrologist and started HD in the same year 2009.  Gets HD at [facility] on a MWF schedule.  Does not have DM. He had a left RC AVF that never functioned, a left upper arm AVF that worked for about 5 years and as of June  . GERD (gastroesophageal reflux disease)   . Hypertension   . Pneumonia 03/2017  . Renal insufficiency   . Secondary hyperparathyroidism ([place])   . Sepsis ([place]) 02/2013   from AVF , treated with Vancomycin.  Marland Kitchen Shortness of breath    With exertion  . Sleep apnea    no  longer using cpap    Past Surgical History:  Procedure Laterality Date  . AV FISTULA PLACEMENT  2009   Left lower arm AVF  . AV FISTULA PLACEMENT Right 02/22/2013   Procedure:  CREATION  OF BRACHIAL CEPHALIC  FISTULA RIGHT ARM;  Surgeon: Elam Dutch, MD;  Location: [facility] OR;  Service: Vascular;  Laterality: Right;  . AV FISTULA PLACEMENT Left 08/10/2014   Procedure: BASILIC VEIN TRANSPOSITION  ARTERIOVENOUS (AV) FISTULA CREATION LEFT UPPER ARM;  Surgeon: Mal Misty, MD;  Location: [place];  Service: Vascular;  Laterality: Left;  . COLONOSCOPY    . ESOPHAGOGASTRODUODENOSCOPY (EGD) WITH PROPOFOL N/A 04/12/2013   Procedure: ESOPHAGOGASTRODUODENOSCOPY (EGD) WITH PROPOFOL;  Surgeon: Arta Silence, MD;  Location: [facility];  Service: Endoscopy;  Laterality: N/A;  . INSERTION OF DIALYSIS CATHETER N/A 12/23/2012   Procedure: INSERTION OF DIALYSIS CATHETER; ULTRASOUND  GUIDED;  Surgeon: Angelia Mould, MD;  Location[facility] OR;  Service: Vascular;  Laterality: N/A;  . INSERTION OF DIALYSIS CATHETER  10/22/2015   Right IJ non-tunneled HD catheter  . IR GENERIC HISTORICAL  10/22/2015   IR US GUIDE VASC ACCESS RIGHT 10/22/2015 [facility]  . IR GENERIC HISTORICAL  10/22/2015   IR FLUORO GUIDE CV LINE RIGHT 10/22/2015 [facility]  . IR GENERIC HISTORICAL  10/23/2015   IR FLUORO GUIDE CV LINE RIGHT 10/23/2015 Marybelle Killings, MD [facility]  . LEFT HEART CATHETERIZATION WITH CORONARY ANGIOGRAM N/A 07/13/2013   Procedure: LEFT HEART CATHETERIZATION WITH CORONARY ANGIOGRAM;  Surgeon: Jettie Booze, MD;  Location: [facility] CATH LAB;  Service: Cardiovascular;  Laterality: N/A;  . LIGATION OF ARTERIOVENOUS  FISTULA Left 12/22/2012   Procedure: LIGATION OF ARTERIOVENOUS  FISTULA;EXCISION OF LARGE ANEURYSMS;;  Surgeon: Elam Dutch, MD;  Location: [facility] OR;  Service: Vascular;  Laterality: Left;    Current Outpatient Medications  Medication Sig Dispense Refill  . acetaminophen (TYLENOL) 650 MG CR tablet Take 1 tablet (650 mg total) by mouth daily as needed for pain.    Marland Kitchen amLODipine (NORVASC) 10 MG tablet TAKE 1 TABLET(10 MG) BY MOUTH DAILY 90 tablet 0  . cloNIDine (CATAPRES - DOSED IN MG/24 HR) 0.3 mg/24hr Place 0.3 mg onto the skin every Sunday. Saturday.    . hydrALAZINE (APRESOLINE) 100 MG tablet TAKE 1 TABLET BY MOUTH 3 TIMES DAILY (Patient taking differently: Take 100 mg by mouth 3 (three) times daily. ) 270 tablet 0  . hydrOXYzine (ATARAX/VISTARIL) 25 MG tablet Take 1 tablet (25 mg total) by mouth every 8 (eight) hours as needed for itching. Further refills need to come from your PCP. 30 tablet 3  . LORazepam (ATIVAN) 0.5 MG tablet Take 1 tablet (0.5 mg total) by mouth 2 (two) times daily as needed for anxiety. (Patient not taking: Reported on 12/22/2017) 30 tablet 1  . metoprolol tartrate (LOPRESSOR) 100 MG tablet Take 100 mg by mouth 2 (two) times daily.    .  SENSIPAR 60 MG tablet Take 1 tablet (60 mg total) by mouth every Monday, Wednesday, and Friday with hemodialysis. (Patient taking differently: Take 60 mg by mouth every Monday, Wednesday, and Friday with hemodialysis. Liquid they put in his IV at dialysys)    . sevelamer carbonate (RENVELA) 800 MG tablet Take 3,200-4,800 mg by mouth See admin instructions. Take 4-6 tablets (3200-4800 mg) by mouth one to two times daily with meals    . tamsulosin (FLOMAX) 0.4 MG CAPS capsule Take 0.4 mg by mouth every other day.   0   No current facility-administered medications for this visit.     Allergies as of 07/12/2018 - Review Complete 07/08/2018  Allergen Reaction Noted  . Aspirin Other (See Comments) 04/06/2008    Family History  Problem Relation Age of Onset  .  Hypertension Mother   . Cerebrovascular Accident Father   . Hypertension Father   . Congestive Heart Failure Brother   . Asthma Brother     Social History   Socioeconomic History  . Marital status: Single    Spouse name: Not on file  . Number of children: 0  . Years of education: 12th  . Highest education level: Not on file  Occupational History  . Occupation: n/a  Social Needs  . Financial resource strain: Not on file  . Food insecurity:    Worry: Not on file    Inability: Not on file  . Transportation needs:    Medical: Not on file    Non-medical: Not on file  Tobacco Use  . Smoking status: Former Smoker    Packs/day: 0.50    Years: 16.00    Pack years: 8.00    Types: Cigarettes    Last attempt to quit: 03/03/2017    Years since quitting: 1.3  . Smokeless tobacco: Never Used  Substance and Sexual Activity  . Alcohol use: Yes    Alcohol/week: 0.0 standard drinks    Comment: occassional  . Drug use: No  . Sexual activity: Not on file  Lifestyle  . Physical activity:    Days per week: Not on file    Minutes per session: Not on file  . Stress: Not on file  Relationships  . Social connections:    Talks on  phone: Not on file    Gets together: Not on file    Attends religious service: Not on file    Active member of club or organization: Not on file    Attends meetings of clubs or organizations: Not on file    Relationship status: Not on file  . Intimate partner violence:    Fear of current or ex partner: Not on file    Emotionally abused: Not on file    Physically abused: Not on file    Forced sexual activity: Not on file  Other Topics Concern  . Not on file  Social History Narrative  . Not on file    Review of Systems: ALL ROS discussed and all others negative except listed in HPI.  Physical Exam: General: in no acute distress Neuro: Alert and appropriate Psych: Normal affect and normal insight   James Wall L. Tarri Glenn, MD, MPH [facility] 07/12/2018, 8:26 AM

## 2018-07-12 NOTE — Patient Instructions (Signed)
Avoid any foods that make you feel nauseated. This may include spicy, strong-smelling, and high fat foods. You might find cold, bland foods easier to eat without feeling nauseated.  Try using ginger products such as tea, ginger tablets, or ginger ale to help with the nausea.  Eat frequent, small, high calorie meals and snacks. Hunger can make the feelings of nausea stronger.  Sit upright or recline with head elevated for at least 30-60 minutes after meals.   Good oral hygiene with frequent tooth brushing can help reduce unpleasant mouth tastes contributing to nausea.  Apply a cool damp cloth on your neck or forehead if you are very nauseous.   Relaxation, imagery, acupressure, and acupuncture may all provide some relief. Keep an open mind and try them.

## 2018-07-14 DIAGNOSIS — Z992 Dependence on renal dialysis: Secondary | ICD-10-CM | POA: Diagnosis not present

## 2018-07-14 DIAGNOSIS — D631 Anemia in chronic kidney disease: Secondary | ICD-10-CM | POA: Diagnosis not present

## 2018-07-14 DIAGNOSIS — N2581 Secondary hyperparathyroidism of renal origin: Secondary | ICD-10-CM | POA: Diagnosis not present

## 2018-07-14 DIAGNOSIS — D509 Iron deficiency anemia, unspecified: Secondary | ICD-10-CM | POA: Diagnosis not present

## 2018-07-14 DIAGNOSIS — N186 End stage renal disease: Secondary | ICD-10-CM | POA: Diagnosis not present

## 2018-07-14 DIAGNOSIS — J158 Pneumonia due to other specified bacteria: Secondary | ICD-10-CM | POA: Diagnosis not present

## 2018-07-16 DIAGNOSIS — Z992 Dependence on renal dialysis: Secondary | ICD-10-CM | POA: Diagnosis not present

## 2018-07-16 DIAGNOSIS — N186 End stage renal disease: Secondary | ICD-10-CM | POA: Diagnosis not present

## 2018-07-16 DIAGNOSIS — N2581 Secondary hyperparathyroidism of renal origin: Secondary | ICD-10-CM | POA: Diagnosis not present

## 2018-07-16 DIAGNOSIS — J158 Pneumonia due to other specified bacteria: Secondary | ICD-10-CM | POA: Diagnosis not present

## 2018-07-16 DIAGNOSIS — D631 Anemia in chronic kidney disease: Secondary | ICD-10-CM | POA: Diagnosis not present

## 2018-07-16 DIAGNOSIS — D509 Iron deficiency anemia, unspecified: Secondary | ICD-10-CM | POA: Diagnosis not present

## 2018-07-19 DIAGNOSIS — D509 Iron deficiency anemia, unspecified: Secondary | ICD-10-CM | POA: Diagnosis not present

## 2018-07-19 DIAGNOSIS — Z992 Dependence on renal dialysis: Secondary | ICD-10-CM | POA: Diagnosis not present

## 2018-07-19 DIAGNOSIS — N2581 Secondary hyperparathyroidism of renal origin: Secondary | ICD-10-CM | POA: Diagnosis not present

## 2018-07-19 DIAGNOSIS — J158 Pneumonia due to other specified bacteria: Secondary | ICD-10-CM | POA: Diagnosis not present

## 2018-07-19 DIAGNOSIS — D631 Anemia in chronic kidney disease: Secondary | ICD-10-CM | POA: Diagnosis not present

## 2018-07-19 DIAGNOSIS — N186 End stage renal disease: Secondary | ICD-10-CM | POA: Diagnosis not present

## 2018-07-20 ENCOUNTER — Ambulatory Visit: Payer: Medicare Other | Admitting: Gastroenterology

## 2018-07-21 DIAGNOSIS — Z992 Dependence on renal dialysis: Secondary | ICD-10-CM | POA: Diagnosis not present

## 2018-07-21 DIAGNOSIS — N186 End stage renal disease: Secondary | ICD-10-CM | POA: Diagnosis not present

## 2018-07-21 DIAGNOSIS — D509 Iron deficiency anemia, unspecified: Secondary | ICD-10-CM | POA: Diagnosis not present

## 2018-07-21 DIAGNOSIS — D631 Anemia in chronic kidney disease: Secondary | ICD-10-CM | POA: Diagnosis not present

## 2018-07-21 DIAGNOSIS — N2581 Secondary hyperparathyroidism of renal origin: Secondary | ICD-10-CM | POA: Diagnosis not present

## 2018-07-21 DIAGNOSIS — J158 Pneumonia due to other specified bacteria: Secondary | ICD-10-CM | POA: Diagnosis not present

## 2018-07-23 DIAGNOSIS — N2581 Secondary hyperparathyroidism of renal origin: Secondary | ICD-10-CM | POA: Diagnosis not present

## 2018-07-23 DIAGNOSIS — Z992 Dependence on renal dialysis: Secondary | ICD-10-CM | POA: Diagnosis not present

## 2018-07-23 DIAGNOSIS — J158 Pneumonia due to other specified bacteria: Secondary | ICD-10-CM | POA: Diagnosis not present

## 2018-07-23 DIAGNOSIS — D509 Iron deficiency anemia, unspecified: Secondary | ICD-10-CM | POA: Diagnosis not present

## 2018-07-23 DIAGNOSIS — N186 End stage renal disease: Secondary | ICD-10-CM | POA: Diagnosis not present

## 2018-07-23 DIAGNOSIS — D631 Anemia in chronic kidney disease: Secondary | ICD-10-CM | POA: Diagnosis not present

## 2018-07-26 DIAGNOSIS — Z992 Dependence on renal dialysis: Secondary | ICD-10-CM | POA: Diagnosis not present

## 2018-07-26 DIAGNOSIS — J158 Pneumonia due to other specified bacteria: Secondary | ICD-10-CM | POA: Diagnosis not present

## 2018-07-26 DIAGNOSIS — N186 End stage renal disease: Secondary | ICD-10-CM | POA: Diagnosis not present

## 2018-07-26 DIAGNOSIS — D509 Iron deficiency anemia, unspecified: Secondary | ICD-10-CM | POA: Diagnosis not present

## 2018-07-26 DIAGNOSIS — N2581 Secondary hyperparathyroidism of renal origin: Secondary | ICD-10-CM | POA: Diagnosis not present

## 2018-07-26 DIAGNOSIS — D631 Anemia in chronic kidney disease: Secondary | ICD-10-CM | POA: Diagnosis not present

## 2018-07-28 DIAGNOSIS — N2581 Secondary hyperparathyroidism of renal origin: Secondary | ICD-10-CM | POA: Diagnosis not present

## 2018-07-28 DIAGNOSIS — D631 Anemia in chronic kidney disease: Secondary | ICD-10-CM | POA: Diagnosis not present

## 2018-07-28 DIAGNOSIS — J158 Pneumonia due to other specified bacteria: Secondary | ICD-10-CM | POA: Diagnosis not present

## 2018-07-28 DIAGNOSIS — Z992 Dependence on renal dialysis: Secondary | ICD-10-CM | POA: Diagnosis not present

## 2018-07-28 DIAGNOSIS — N186 End stage renal disease: Secondary | ICD-10-CM | POA: Diagnosis not present

## 2018-07-28 DIAGNOSIS — D509 Iron deficiency anemia, unspecified: Secondary | ICD-10-CM | POA: Diagnosis not present

## 2018-07-29 ENCOUNTER — Telehealth: Payer: Self-pay | Admitting: *Deleted

## 2018-07-29 ENCOUNTER — Ambulatory Visit
Admission: RE | Admit: 2018-07-29 | Discharge: 2018-07-29 | Disposition: A | Discharge status: Home / Self Care | Payer: Medicare Other | Source: Ambulatory Visit | Attending: Gastroenterology | Admitting: Gastroenterology

## 2018-07-29 DIAGNOSIS — R112 Nausea with vomiting, unspecified: Secondary | ICD-10-CM

## 2018-07-29 DIAGNOSIS — K3189 Other diseases of stomach and duodenum: Secondary | ICD-10-CM | POA: Diagnosis not present

## 2018-07-29 DIAGNOSIS — K449 Diaphragmatic hernia without obstruction or gangrene: Secondary | ICD-10-CM | POA: Diagnosis not present

## 2018-07-29 NOTE — Telephone Encounter (Signed)
Spoke to the patient, notified the patient of results.  EGD scheduled with patient:  Tues 08/24/2018 at 9:00 am TELEPHONE previsit with RN  Tues 09/07/2018 at 8:30 am EGD in [facility]

## 2018-07-30 DIAGNOSIS — D509 Iron deficiency anemia, unspecified: Secondary | ICD-10-CM | POA: Diagnosis not present

## 2018-07-30 DIAGNOSIS — D631 Anemia in chronic kidney disease: Secondary | ICD-10-CM | POA: Diagnosis not present

## 2018-07-30 DIAGNOSIS — N2581 Secondary hyperparathyroidism of renal origin: Secondary | ICD-10-CM | POA: Diagnosis not present

## 2018-07-30 DIAGNOSIS — N186 End stage renal disease: Secondary | ICD-10-CM | POA: Diagnosis not present

## 2018-07-30 DIAGNOSIS — Z992 Dependence on renal dialysis: Secondary | ICD-10-CM | POA: Diagnosis not present

## 2018-07-30 DIAGNOSIS — J158 Pneumonia due to other specified bacteria: Secondary | ICD-10-CM | POA: Diagnosis not present

## 2018-08-02 DIAGNOSIS — Z992 Dependence on renal dialysis: Secondary | ICD-10-CM | POA: Diagnosis not present

## 2018-08-02 DIAGNOSIS — J158 Pneumonia due to other specified bacteria: Secondary | ICD-10-CM | POA: Diagnosis not present

## 2018-08-02 DIAGNOSIS — D631 Anemia in chronic kidney disease: Secondary | ICD-10-CM | POA: Diagnosis not present

## 2018-08-02 DIAGNOSIS — N186 End stage renal disease: Secondary | ICD-10-CM | POA: Diagnosis not present

## 2018-08-02 DIAGNOSIS — D509 Iron deficiency anemia, unspecified: Secondary | ICD-10-CM | POA: Diagnosis not present

## 2018-08-02 DIAGNOSIS — N2581 Secondary hyperparathyroidism of renal origin: Secondary | ICD-10-CM | POA: Diagnosis not present

## 2018-08-04 DIAGNOSIS — N2581 Secondary hyperparathyroidism of renal origin: Secondary | ICD-10-CM | POA: Diagnosis not present

## 2018-08-04 DIAGNOSIS — I12 Hypertensive chronic kidney disease with 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: Secondary | ICD-10-CM | POA: Diagnosis not present

## 2018-08-04 DIAGNOSIS — D631 Anemia in chronic kidney disease: Secondary | ICD-10-CM | POA: Diagnosis not present

## 2018-08-04 DIAGNOSIS — J158 Pneumonia due to other specified bacteria: Secondary | ICD-10-CM | POA: Diagnosis not present

## 2018-08-04 DIAGNOSIS — D509 Iron deficiency anemia, unspecified: Secondary | ICD-10-CM | POA: Diagnosis not present

## 2018-08-04 DIAGNOSIS — Z992 Dependence on renal dialysis: Secondary | ICD-10-CM | POA: Diagnosis not present

## 2018-08-04 DIAGNOSIS — N186 End stage renal disease: Secondary | ICD-10-CM | POA: Diagnosis not present

## 2018-08-06 DIAGNOSIS — J158 Pneumonia due to other specified bacteria: Secondary | ICD-10-CM | POA: Diagnosis not present

## 2018-08-06 DIAGNOSIS — N2581 Secondary hyperparathyroidism of renal origin: Secondary | ICD-10-CM | POA: Diagnosis not present

## 2018-08-06 DIAGNOSIS — D509 Iron deficiency anemia, unspecified: Secondary | ICD-10-CM | POA: Diagnosis not present

## 2018-08-06 DIAGNOSIS — N186 End stage renal disease: Secondary | ICD-10-CM | POA: Diagnosis not present

## 2018-08-06 DIAGNOSIS — Z992 Dependence on renal dialysis: Secondary | ICD-10-CM | POA: Diagnosis not present

## 2018-08-06 DIAGNOSIS — D631 Anemia in chronic kidney disease: Secondary | ICD-10-CM | POA: Diagnosis not present

## 2018-08-09 DIAGNOSIS — N186 End stage renal disease: Secondary | ICD-10-CM | POA: Diagnosis not present

## 2018-08-09 DIAGNOSIS — Z992 Dependence on renal dialysis: Secondary | ICD-10-CM | POA: Diagnosis not present

## 2018-08-09 DIAGNOSIS — D631 Anemia in chronic kidney disease: Secondary | ICD-10-CM | POA: Diagnosis not present

## 2018-08-09 DIAGNOSIS — N2581 Secondary hyperparathyroidism of renal origin: Secondary | ICD-10-CM | POA: Diagnosis not present

## 2018-08-09 DIAGNOSIS — D509 Iron deficiency anemia, unspecified: Secondary | ICD-10-CM | POA: Diagnosis not present

## 2018-08-09 DIAGNOSIS — J158 Pneumonia due to other specified bacteria: Secondary | ICD-10-CM | POA: Diagnosis not present

## 2018-08-11 DIAGNOSIS — D631 Anemia in chronic kidney disease: Secondary | ICD-10-CM | POA: Diagnosis not present

## 2018-08-11 DIAGNOSIS — N2581 Secondary hyperparathyroidism of renal origin: Secondary | ICD-10-CM | POA: Diagnosis not present

## 2018-08-11 DIAGNOSIS — D509 Iron deficiency anemia, unspecified: Secondary | ICD-10-CM | POA: Diagnosis not present

## 2018-08-11 DIAGNOSIS — Z992 Dependence on renal dialysis: Secondary | ICD-10-CM | POA: Diagnosis not present

## 2018-08-11 DIAGNOSIS — J158 Pneumonia due to other specified bacteria: Secondary | ICD-10-CM | POA: Diagnosis not present

## 2018-08-11 DIAGNOSIS — N186 End stage renal disease: Secondary | ICD-10-CM | POA: Diagnosis not present

## 2018-08-13 DIAGNOSIS — D631 Anemia in chronic kidney disease: Secondary | ICD-10-CM | POA: Diagnosis not present

## 2018-08-13 DIAGNOSIS — D509 Iron deficiency anemia, unspecified: Secondary | ICD-10-CM | POA: Diagnosis not present

## 2018-08-13 DIAGNOSIS — N186 End stage renal disease: Secondary | ICD-10-CM | POA: Diagnosis not present

## 2018-08-13 DIAGNOSIS — J158 Pneumonia due to other specified bacteria: Secondary | ICD-10-CM | POA: Diagnosis not present

## 2018-08-13 DIAGNOSIS — Z992 Dependence on renal dialysis: Secondary | ICD-10-CM | POA: Diagnosis not present

## 2018-08-13 DIAGNOSIS — N2581 Secondary hyperparathyroidism of renal origin: Secondary | ICD-10-CM | POA: Diagnosis not present

## 2018-08-16 DIAGNOSIS — D509 Iron deficiency anemia, unspecified: Secondary | ICD-10-CM | POA: Diagnosis not present

## 2018-08-16 DIAGNOSIS — N2581 Secondary hyperparathyroidism of renal origin: Secondary | ICD-10-CM | POA: Diagnosis not present

## 2018-08-16 DIAGNOSIS — N186 End stage renal disease: Secondary | ICD-10-CM | POA: Diagnosis not present

## 2018-08-16 DIAGNOSIS — D631 Anemia in chronic kidney disease: Secondary | ICD-10-CM | POA: Diagnosis not present

## 2018-08-16 DIAGNOSIS — J158 Pneumonia due to other specified bacteria: Secondary | ICD-10-CM | POA: Diagnosis not present

## 2018-08-16 DIAGNOSIS — Z992 Dependence on renal dialysis: Secondary | ICD-10-CM | POA: Diagnosis not present

## 2018-08-18 DIAGNOSIS — Z992 Dependence on renal dialysis: Secondary | ICD-10-CM | POA: Diagnosis not present

## 2018-08-18 DIAGNOSIS — J158 Pneumonia due to other specified bacteria: Secondary | ICD-10-CM | POA: Diagnosis not present

## 2018-08-18 DIAGNOSIS — N2581 Secondary hyperparathyroidism of renal origin: Secondary | ICD-10-CM | POA: Diagnosis not present

## 2018-08-18 DIAGNOSIS — D509 Iron deficiency anemia, unspecified: Secondary | ICD-10-CM | POA: Diagnosis not present

## 2018-08-18 DIAGNOSIS — D631 Anemia in chronic kidney disease: Secondary | ICD-10-CM | POA: Diagnosis not present

## 2018-08-18 DIAGNOSIS — N186 End stage renal disease: Secondary | ICD-10-CM | POA: Diagnosis not present

## 2018-08-20 DIAGNOSIS — N2581 Secondary hyperparathyroidism of renal origin: Secondary | ICD-10-CM | POA: Diagnosis not present

## 2018-08-20 DIAGNOSIS — D509 Iron deficiency anemia, unspecified: Secondary | ICD-10-CM | POA: Diagnosis not present

## 2018-08-20 DIAGNOSIS — N186 End stage renal disease: Secondary | ICD-10-CM | POA: Diagnosis not present

## 2018-08-20 DIAGNOSIS — Z992 Dependence on renal dialysis: Secondary | ICD-10-CM | POA: Diagnosis not present

## 2018-08-20 DIAGNOSIS — J158 Pneumonia due to other specified bacteria: Secondary | ICD-10-CM | POA: Diagnosis not present

## 2018-08-20 DIAGNOSIS — D631 Anemia in chronic kidney disease: Secondary | ICD-10-CM | POA: Diagnosis not present

## 2018-08-23 DIAGNOSIS — N186 End stage renal disease: Secondary | ICD-10-CM | POA: Diagnosis not present

## 2018-08-23 DIAGNOSIS — Z992 Dependence on renal dialysis: Secondary | ICD-10-CM | POA: Diagnosis not present

## 2018-08-23 DIAGNOSIS — J158 Pneumonia due to other specified bacteria: Secondary | ICD-10-CM | POA: Diagnosis not present

## 2018-08-23 DIAGNOSIS — N2581 Secondary hyperparathyroidism of renal origin: Secondary | ICD-10-CM | POA: Diagnosis not present

## 2018-08-23 DIAGNOSIS — D631 Anemia in chronic kidney disease: Secondary | ICD-10-CM | POA: Diagnosis not present

## 2018-08-23 DIAGNOSIS — D509 Iron deficiency anemia, unspecified: Secondary | ICD-10-CM | POA: Diagnosis not present

## 2018-08-24 ENCOUNTER — Telehealth: Payer: Self-pay

## 2018-08-24 NOTE — Telephone Encounter (Signed)
Pt returned your call and is r/s to tomorrow 7/22 at 1:00pm at [facility].

## 2018-08-24 NOTE — Telephone Encounter (Signed)
Patient was scheduled for Pre-Visit today at 9:00 Am. Patient was called twice and no answer. Two messages were left on voicemail to call and reschedule PV before 5:00 Pm today or his scheduled Endoscopy with Dr. Tarri Glenn will be cancelled per [place] guidelines.  If patient does not reschedule a No Show letter will be mailed today.   Riki Sheer, LPN ( PV )

## 2018-08-25 ENCOUNTER — Other Ambulatory Visit: Payer: Self-pay

## 2018-08-25 ENCOUNTER — Ambulatory Visit: Payer: Medicare Other | Admitting: *Deleted

## 2018-08-25 VITALS — Ht 65.0 in | Wt 180.0 lb

## 2018-08-25 DIAGNOSIS — Z992 Dependence on renal dialysis: Secondary | ICD-10-CM | POA: Diagnosis not present

## 2018-08-25 DIAGNOSIS — N2581 Secondary hyperparathyroidism of renal origin: Secondary | ICD-10-CM | POA: Diagnosis not present

## 2018-08-25 DIAGNOSIS — D631 Anemia in chronic kidney disease: Secondary | ICD-10-CM | POA: Diagnosis not present

## 2018-08-25 DIAGNOSIS — D509 Iron deficiency anemia, unspecified: Secondary | ICD-10-CM | POA: Diagnosis not present

## 2018-08-25 DIAGNOSIS — N186 End stage renal disease: Secondary | ICD-10-CM | POA: Diagnosis not present

## 2018-08-25 DIAGNOSIS — J158 Pneumonia due to other specified bacteria: Secondary | ICD-10-CM | POA: Diagnosis not present

## 2018-08-25 DIAGNOSIS — R112 Nausea with vomiting, unspecified: Secondary | ICD-10-CM

## 2018-08-25 NOTE — Progress Notes (Signed)
previsit via telephone. ID per name, dob and address. Patient plans to use transportation. Patient made aware that he will need a carepartner to be with him. Patient aware that his procedure cannot be done without carepartner present.patient will call his sister to accompany him. No egg or soy allergy known to patient  No issues with past sedation with any surgeries  or procedures, no intubation problems  No diet pills per patient No home 02 use per patient  No blood thinners per patient  Pt denies issues with constipation  No A fib or A flutter  EMMI information, instructions, consent and acknowledgement forms sent in packet.

## 2018-08-27 DIAGNOSIS — J158 Pneumonia due to other specified bacteria: Secondary | ICD-10-CM | POA: Diagnosis not present

## 2018-08-27 DIAGNOSIS — N2581 Secondary hyperparathyroidism of renal origin: Secondary | ICD-10-CM | POA: Diagnosis not present

## 2018-08-27 DIAGNOSIS — D509 Iron deficiency anemia, unspecified: Secondary | ICD-10-CM | POA: Diagnosis not present

## 2018-08-27 DIAGNOSIS — D631 Anemia in chronic kidney disease: Secondary | ICD-10-CM | POA: Diagnosis not present

## 2018-08-27 DIAGNOSIS — Z992 Dependence on renal dialysis: Secondary | ICD-10-CM | POA: Diagnosis not present

## 2018-08-27 DIAGNOSIS — N186 End stage renal disease: Secondary | ICD-10-CM | POA: Diagnosis not present

## 2018-08-30 DIAGNOSIS — N2581 Secondary hyperparathyroidism of renal origin: Secondary | ICD-10-CM | POA: Diagnosis not present

## 2018-08-30 DIAGNOSIS — Z992 Dependence on renal dialysis: Secondary | ICD-10-CM | POA: Diagnosis not present

## 2018-08-30 DIAGNOSIS — N186 End stage renal disease: Secondary | ICD-10-CM | POA: Diagnosis not present

## 2018-08-30 DIAGNOSIS — J158 Pneumonia due to other specified bacteria: Secondary | ICD-10-CM | POA: Diagnosis not present

## 2018-08-30 DIAGNOSIS — D509 Iron deficiency anemia, unspecified: Secondary | ICD-10-CM | POA: Diagnosis not present

## 2018-08-30 DIAGNOSIS — D631 Anemia in chronic kidney disease: Secondary | ICD-10-CM | POA: Diagnosis not present

## 2018-09-01 DIAGNOSIS — D509 Iron deficiency anemia, unspecified: Secondary | ICD-10-CM | POA: Diagnosis not present

## 2018-09-01 DIAGNOSIS — D631 Anemia in chronic kidney disease: Secondary | ICD-10-CM | POA: Diagnosis not present

## 2018-09-01 DIAGNOSIS — N2581 Secondary hyperparathyroidism of renal origin: Secondary | ICD-10-CM | POA: Diagnosis not present

## 2018-09-01 DIAGNOSIS — J158 Pneumonia due to other specified bacteria: Secondary | ICD-10-CM | POA: Diagnosis not present

## 2018-09-01 DIAGNOSIS — N186 End stage renal disease: Secondary | ICD-10-CM | POA: Diagnosis not present

## 2018-09-01 DIAGNOSIS — Z992 Dependence on renal dialysis: Secondary | ICD-10-CM | POA: Diagnosis not present

## 2018-09-03 DIAGNOSIS — Z992 Dependence on renal dialysis: Secondary | ICD-10-CM | POA: Diagnosis not present

## 2018-09-03 DIAGNOSIS — N186 End stage renal disease: Secondary | ICD-10-CM | POA: Diagnosis not present

## 2018-09-03 DIAGNOSIS — J158 Pneumonia due to other specified bacteria: Secondary | ICD-10-CM | POA: Diagnosis not present

## 2018-09-03 DIAGNOSIS — D631 Anemia in chronic kidney disease: Secondary | ICD-10-CM | POA: Diagnosis not present

## 2018-09-03 DIAGNOSIS — D509 Iron deficiency anemia, unspecified: Secondary | ICD-10-CM | POA: Diagnosis not present

## 2018-09-03 DIAGNOSIS — N2581 Secondary hyperparathyroidism of renal origin: Secondary | ICD-10-CM | POA: Diagnosis not present

## 2018-09-04 DIAGNOSIS — Z992 Dependence on renal dialysis: Secondary | ICD-10-CM | POA: Diagnosis not present

## 2018-09-04 DIAGNOSIS — N186 End stage renal disease: Secondary | ICD-10-CM | POA: Diagnosis not present

## 2018-09-04 DIAGNOSIS — I12 Hypertensive chronic kidney disease with 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: Secondary | ICD-10-CM | POA: Diagnosis not present

## 2018-09-06 ENCOUNTER — Telehealth: Payer: Self-pay | Admitting: Gastroenterology

## 2018-09-06 DIAGNOSIS — D631 Anemia in chronic kidney disease: Secondary | ICD-10-CM | POA: Diagnosis not present

## 2018-09-06 DIAGNOSIS — D689 Coagulation defect, unspecified: Secondary | ICD-10-CM | POA: Diagnosis not present

## 2018-09-06 DIAGNOSIS — J158 Pneumonia due to other specified bacteria: Secondary | ICD-10-CM | POA: Diagnosis not present

## 2018-09-06 DIAGNOSIS — D509 Iron deficiency anemia, unspecified: Secondary | ICD-10-CM | POA: Diagnosis not present

## 2018-09-06 DIAGNOSIS — Z992 Dependence on renal dialysis: Secondary | ICD-10-CM | POA: Diagnosis not present

## 2018-09-06 DIAGNOSIS — N186 End stage renal disease: Secondary | ICD-10-CM | POA: Diagnosis not present

## 2018-09-06 DIAGNOSIS — N2581 Secondary hyperparathyroidism of renal origin: Secondary | ICD-10-CM | POA: Diagnosis not present

## 2018-09-06 NOTE — Telephone Encounter (Signed)
Spoke with patient regarding Covid-19 screening questions. Covid-19 Screening Questions:  Do you now or have you had a fever in the last 14 days? no  Do you have any respiratory symptoms of shortness of breath or cough now or in the last 14 days? no  Do you have any family members or close contacts with diagnosed or suspected Covid-19 in the past 14 days? no  Have you been tested for Covid-19 and found to be positive? no  Pt made aware of that care partner may wait in the car or come up to the lobby during the procedure but will need to provide their own mask.

## 2018-09-07 ENCOUNTER — Encounter: Payer: Self-pay | Admitting: Gastroenterology

## 2018-09-07 ENCOUNTER — Ambulatory Visit (AMBULATORY_SURGERY_CENTER): Payer: Medicare Other | Admitting: Gastroenterology

## 2018-09-07 ENCOUNTER — Other Ambulatory Visit: Payer: Self-pay

## 2018-09-07 VITALS — BP 146/72 | HR 51 | Temp 99.3°F | Resp 12 | Ht 65.0 in | Wt 180.0 lb

## 2018-09-07 DIAGNOSIS — K222 Esophageal obstruction: Secondary | ICD-10-CM

## 2018-09-07 DIAGNOSIS — R112 Nausea with vomiting, unspecified: Secondary | ICD-10-CM

## 2018-09-07 DIAGNOSIS — K200 Eosinophilic esophagitis: Secondary | ICD-10-CM | POA: Diagnosis not present

## 2018-09-07 DIAGNOSIS — K209 Esophagitis, unspecified: Secondary | ICD-10-CM | POA: Diagnosis not present

## 2018-09-07 DIAGNOSIS — K449 Diaphragmatic hernia without obstruction or gangrene: Secondary | ICD-10-CM

## 2018-09-07 DIAGNOSIS — K2980 Duodenitis without bleeding: Secondary | ICD-10-CM

## 2018-09-07 DIAGNOSIS — G4733 Obstructive sleep apnea (adult) (pediatric): Secondary | ICD-10-CM | POA: Diagnosis not present

## 2018-09-07 DIAGNOSIS — J449 Chronic obstructive pulmonary disease, unspecified: Secondary | ICD-10-CM | POA: Diagnosis not present

## 2018-09-07 DIAGNOSIS — K7460 Unspecified cirrhosis of liver: Secondary | ICD-10-CM | POA: Diagnosis not present

## 2018-09-07 DIAGNOSIS — K219 Gastro-esophageal reflux disease without esophagitis: Secondary | ICD-10-CM | POA: Diagnosis not present

## 2018-09-07 DIAGNOSIS — N186 End stage renal disease: Secondary | ICD-10-CM | POA: Diagnosis not present

## 2018-09-07 DIAGNOSIS — I12 Hypertensive chronic kidney disease with 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: Secondary | ICD-10-CM | POA: Diagnosis not present

## 2018-09-07 HISTORY — PX: UPPER GASTROINTESTINAL ENDOSCOPY: SHX188

## 2018-09-07 MED ORDER — SODIUM CHLORIDE 0.9 % IV SOLN: 500.0000 mL | Freq: Once | INTRAVENOUS | Status: DC

## 2018-09-07 NOTE — Patient Instructions (Signed)
Discharge instructions given. Biopsies taken. Avoid all NSAIDS. Resume previous medications. YOU HAD AN ENDOSCOPIC PROCEDURE TODAY AT [facility]:   Refer to the procedure report that was given to you for any specific questions about what was found during the examination.  If the procedure report does not answer your questions, please call your gastroenterologist to clarify.  If you requested that your care partner not be given the details of your procedure findings, then the procedure report has been included in a sealed envelope for you to review at your convenience later.  YOU SHOULD EXPECT: Some feelings of bloating in the abdomen. Passage of more gas than usual.  Walking can help get rid of the air that was put into your GI tract during the procedure and reduce the bloating. If you had a lower endoscopy (such as a colonoscopy or flexible sigmoidoscopy) you may notice spotting of blood in your stool or on the toilet paper. If you underwent a bowel prep for your procedure, you may not have a normal bowel movement for a few days.  Please Note:  You might notice some irritation and congestion in your nose or some drainage.  This is from the oxygen used during your procedure.  There is no need for concern and it should clear up in a day or so.  SYMPTOMS TO REPORT IMMEDIATELY:    Following upper endoscopy (EGD)  Vomiting of blood or coffee ground material  New chest pain or pain under the shoulder blades  Painful or persistently difficult swallowing  New shortness of breath  Fever of 100F or higher  Black, tarry-looking stools  For urgent or emergent issues, a gastroenterologist can be reached at any hour by calling [phone].   DIET:  We do recommend a small meal at first, but then you may proceed to your regular diet.  Drink plenty of fluids but you should avoid alcoholic beverages for 24 hours.  ACTIVITY:  You should plan to take it easy for the rest of today and  you should NOT DRIVE or use heavy machinery until tomorrow (because of the sedation medicines used during the test).    FOLLOW UP: Our staff will call the number listed on your records 48-72 hours following your procedure to check on you and address any questions or concerns that you may have regarding the information given to you following your procedure. If we do not reach you, we will leave a message.  We will attempt to reach you two times.  During this call, we will ask if you have developed any symptoms of COVID 19. If you develop any symptoms (ie: fever, flu-like symptoms, shortness of breath, cough etc.) before then, please call [phone].  If you test positive for Covid 19 in the 2 weeks post procedure, please call and report this information to [place].    If any biopsies were taken you will be contacted by phone or by letter within the next 1-3 weeks.  Please call us at [phone] if you have not heard about the biopsies in 3 weeks.    SIGNATURES/CONFIDENTIALITY: You and/or your care partner have signed paperwork which will be entered into your electronic medical record.  These signatures attest to the fact that that the information above on your After Visit Summary has been reviewed and is understood.  Full responsibility of the confidentiality of this discharge information lies with you and/or your care-partner.

## 2018-09-07 NOTE — Op Note (Signed)
[place] Patient Name: James Wall Procedure Date: 09/07/2018 7:58 AM MRN: [id number] Endoscopist: Thornton Park MD, MD Age: 53 Referring MD:  Date of Birth: 06-01-1965 Gender: Male Account #: [id number] Procedure:                Upper GI endoscopy Indications:              Abnormal UGI series (nonspecific mild fold                            thickening in the proximal stomach)                           Intermittent nausea and vomiting x4 years                           - prior work up includes colonoscopy, EGD, CT, GES,                            and ultrasound                           - on omeprazole 20 mg daily                           ? Cryptogenic cirrhosis                           - unclear how diagnosis was made                           - platelets 206,000                           - echogenic liver noted on ultrasound                           Microcytic anemia with a hemoglobin of 8.6                           ESRD on hemodialysis x 11 year (Dr. Carmina Miller) Medicines:                See the Anesthesia note for documentation of the                            administered medications Procedure:                Pre-Anesthesia Assessment:                           - Prior to the procedure, a History and Physical                            was performed, and patient medications and                            allergies were reviewed. The patient's tolerance of  previous anesthesia was also reviewed. The risks                            and benefits of the procedure and the sedation                            options and risks were discussed with the patient.                            All questions were answered, and informed consent                            was obtained. Prior Anticoagulants: The patient has                            taken no previous anticoagulant or antiplatelet                            agents. ASA Grade  Assessment: III - A patient with                            severe systemic disease. After reviewing the risks                            and benefits, the patient was deemed in                            satisfactory condition to undergo the procedure.                           After obtaining informed consent, the endoscope was                            passed under direct vision. Throughout the                            procedure, the patient's blood pressure, pulse, and                            oxygen saturations were monitored continuously. The                            Endoscope was introduced through the mouth, and                            advanced to the third part of duodenum. The upper                            GI endoscopy was accomplished without difficulty.                            The patient tolerated the procedure well. Scope In: Scope Out: Findings:  A widely patent Schatzki ring was found at the                            gastroesophageal junction.                           The examined esophagus was normal. Biopsies were                            obtained from the proximal and distal esophagus                            with cold forceps for histology. Some of these                            biopsies were targeted to disrupt the Schatzki's                            ring. No esophageal varices seen.                           Diffuse mildly erythematous mucosa without bleeding                            was found in the gastric body. Biopsies were taken                            with a cold forceps for histology. Estimated blood                            loss was minimal. No portal hypertensive                            gastropathy or gastric varices seen.                           Diffuse mildly erythematous mucosa without active                            bleeding and with no stigmata of bleeding was found                            in the  duodenal bulb. Biopsies were taken with a                            cold forceps for histology.                           A small hiatal hernia was present.                           The cardia and gastric fundus were normal on  retroflexion.                           The exam was otherwise without abnormality. Complications:            No immediate complications. Estimated blood loss:                            Minimal. Estimated Blood Loss:     Estimated blood loss was minimal. Impression:               - Widely patent Schatzki ring.                           - Normal esophagus. Biopsied.                           - Erythematous mucosa in the gastric body. Biopsied.                           - Erythematous duodenopathy. Biopsied.                           - Small hiatal hernia.                           - No obvious finding to explain the UGI series                            findings. Recommendation:           - Patient has a contact number available for                            emergencies. The signs and symptoms of potential                            delayed complications were discussed with the                            patient. Return to normal activities tomorrow.                            Written discharge instructions were provided to the                            patient.                           - Resume regular diet today.                           - Continue present medications including                            pantoprazole 40 mg twice daily                           -  Avoid all NSAIDs                           - Await pathology results.                           - Follow-up encounter with me to review these                            results Thornton Park MD, MD 09/07/2018 8:42:40 AM This report has been signed electronically.

## 2018-09-07 NOTE — Progress Notes (Signed)
Called to room to assist during endoscopic procedure.  Patient ID and intended procedure confirmed with present staff. Received instructions for my participation in the procedure from the performing physician.  

## 2018-09-07 NOTE — Progress Notes (Signed)
Report to PACU, RN, vss, BBS= Clear.  

## 2018-09-07 NOTE — Progress Notes (Signed)
[place],  [place] Riki Sheer, LPN - VS    Pt's states no medical or surgical changes since previsit or office visit.

## 2018-09-08 DIAGNOSIS — N186 End stage renal disease: Secondary | ICD-10-CM | POA: Diagnosis not present

## 2018-09-08 DIAGNOSIS — D509 Iron deficiency anemia, unspecified: Secondary | ICD-10-CM | POA: Diagnosis not present

## 2018-09-08 DIAGNOSIS — N2581 Secondary hyperparathyroidism of renal origin: Secondary | ICD-10-CM | POA: Diagnosis not present

## 2018-09-08 DIAGNOSIS — J158 Pneumonia due to other specified bacteria: Secondary | ICD-10-CM | POA: Diagnosis not present

## 2018-09-08 DIAGNOSIS — D689 Coagulation defect, unspecified: Secondary | ICD-10-CM | POA: Diagnosis not present

## 2018-09-08 DIAGNOSIS — Z992 Dependence on renal dialysis: Secondary | ICD-10-CM | POA: Diagnosis not present

## 2018-09-09 ENCOUNTER — Telehealth: Payer: Self-pay

## 2018-09-09 NOTE — Telephone Encounter (Signed)
NO ANSWER, MESSAGE LEFT FOR PATIENT.

## 2018-09-09 NOTE — Telephone Encounter (Signed)
  Follow up Call-  Call back number 09/07/2018  Post procedure Call Back phone  # [phone] cell  Permission to leave phone message Yes  Some recent data might be hidden     Patient questions:  Do you have a fever, pain , or abdominal swelling? No. Pain Score  0 *  Have you tolerated food without any problems? Yes.    Have you been able to return to your normal activities? Yes.    Do you have any questions about your discharge instructions: Diet   No. Medications  No. Follow up visit  No.  Do you have questions or concerns about your Care? No.  Actions: * If pain score is 4 or above: No action needed, pain <4. 1. Have you developed a fever since your procedure? no  2.   Have you had an respiratory symptoms (SOB or cough) since your procedure? no  3.   Have you tested positive for COVID 19 since your procedure no  4.   Have you had any family members/close contacts diagnosed with the COVID 19 since your procedure?  no   If yes to any of these questions please route to Joylene John, RN and Alphonsa Gin, Therapist, sports.

## 2018-09-10 ENCOUNTER — Encounter: Payer: Self-pay | Admitting: *Deleted

## 2018-09-10 DIAGNOSIS — D689 Coagulation defect, unspecified: Secondary | ICD-10-CM | POA: Diagnosis not present

## 2018-09-10 DIAGNOSIS — N2581 Secondary hyperparathyroidism of renal origin: Secondary | ICD-10-CM | POA: Diagnosis not present

## 2018-09-10 DIAGNOSIS — Z992 Dependence on renal dialysis: Secondary | ICD-10-CM | POA: Diagnosis not present

## 2018-09-10 DIAGNOSIS — D509 Iron deficiency anemia, unspecified: Secondary | ICD-10-CM | POA: Diagnosis not present

## 2018-09-10 DIAGNOSIS — J158 Pneumonia due to other specified bacteria: Secondary | ICD-10-CM | POA: Diagnosis not present

## 2018-09-10 DIAGNOSIS — N186 End stage renal disease: Secondary | ICD-10-CM | POA: Diagnosis not present

## 2018-09-13 DIAGNOSIS — D689 Coagulation defect, unspecified: Secondary | ICD-10-CM | POA: Diagnosis not present

## 2018-09-13 DIAGNOSIS — Z992 Dependence on renal dialysis: Secondary | ICD-10-CM | POA: Diagnosis not present

## 2018-09-13 DIAGNOSIS — J158 Pneumonia due to other specified bacteria: Secondary | ICD-10-CM | POA: Diagnosis not present

## 2018-09-13 DIAGNOSIS — D509 Iron deficiency anemia, unspecified: Secondary | ICD-10-CM | POA: Diagnosis not present

## 2018-09-13 DIAGNOSIS — N186 End stage renal disease: Secondary | ICD-10-CM | POA: Diagnosis not present

## 2018-09-13 DIAGNOSIS — N2581 Secondary hyperparathyroidism of renal origin: Secondary | ICD-10-CM | POA: Diagnosis not present

## 2018-09-15 DIAGNOSIS — D689 Coagulation defect, unspecified: Secondary | ICD-10-CM | POA: Diagnosis not present

## 2018-09-15 DIAGNOSIS — Z992 Dependence on renal dialysis: Secondary | ICD-10-CM | POA: Diagnosis not present

## 2018-09-15 DIAGNOSIS — N2581 Secondary hyperparathyroidism of renal origin: Secondary | ICD-10-CM | POA: Diagnosis not present

## 2018-09-15 DIAGNOSIS — J158 Pneumonia due to other specified bacteria: Secondary | ICD-10-CM | POA: Diagnosis not present

## 2018-09-15 DIAGNOSIS — D509 Iron deficiency anemia, unspecified: Secondary | ICD-10-CM | POA: Diagnosis not present

## 2018-09-15 DIAGNOSIS — N186 End stage renal disease: Secondary | ICD-10-CM | POA: Diagnosis not present

## 2018-09-17 DIAGNOSIS — N2581 Secondary hyperparathyroidism of renal origin: Secondary | ICD-10-CM | POA: Diagnosis not present

## 2018-09-17 DIAGNOSIS — Z992 Dependence on renal dialysis: Secondary | ICD-10-CM | POA: Diagnosis not present

## 2018-09-17 DIAGNOSIS — D509 Iron deficiency anemia, unspecified: Secondary | ICD-10-CM | POA: Diagnosis not present

## 2018-09-17 DIAGNOSIS — N186 End stage renal disease: Secondary | ICD-10-CM | POA: Diagnosis not present

## 2018-09-17 DIAGNOSIS — J158 Pneumonia due to other specified bacteria: Secondary | ICD-10-CM | POA: Diagnosis not present

## 2018-09-17 DIAGNOSIS — D689 Coagulation defect, unspecified: Secondary | ICD-10-CM | POA: Diagnosis not present

## 2018-09-20 DIAGNOSIS — N186 End stage renal disease: Secondary | ICD-10-CM | POA: Diagnosis not present

## 2018-09-20 DIAGNOSIS — N2581 Secondary hyperparathyroidism of renal origin: Secondary | ICD-10-CM | POA: Diagnosis not present

## 2018-09-20 DIAGNOSIS — D689 Coagulation defect, unspecified: Secondary | ICD-10-CM | POA: Diagnosis not present

## 2018-09-20 DIAGNOSIS — Z992 Dependence on renal dialysis: Secondary | ICD-10-CM | POA: Diagnosis not present

## 2018-09-20 DIAGNOSIS — D509 Iron deficiency anemia, unspecified: Secondary | ICD-10-CM | POA: Diagnosis not present

## 2018-09-20 DIAGNOSIS — J158 Pneumonia due to other specified bacteria: Secondary | ICD-10-CM | POA: Diagnosis not present

## 2018-09-22 DIAGNOSIS — N2581 Secondary hyperparathyroidism of renal origin: Secondary | ICD-10-CM | POA: Diagnosis not present

## 2018-09-22 DIAGNOSIS — D689 Coagulation defect, unspecified: Secondary | ICD-10-CM | POA: Diagnosis not present

## 2018-09-22 DIAGNOSIS — J158 Pneumonia due to other specified bacteria: Secondary | ICD-10-CM | POA: Diagnosis not present

## 2018-09-22 DIAGNOSIS — N186 End stage renal disease: Secondary | ICD-10-CM | POA: Diagnosis not present

## 2018-09-22 DIAGNOSIS — D509 Iron deficiency anemia, unspecified: Secondary | ICD-10-CM | POA: Diagnosis not present

## 2018-09-22 DIAGNOSIS — Z992 Dependence on renal dialysis: Secondary | ICD-10-CM | POA: Diagnosis not present

## 2018-09-24 DIAGNOSIS — Z992 Dependence on renal dialysis: Secondary | ICD-10-CM | POA: Diagnosis not present

## 2018-09-24 DIAGNOSIS — D509 Iron deficiency anemia, unspecified: Secondary | ICD-10-CM | POA: Diagnosis not present

## 2018-09-24 DIAGNOSIS — N2581 Secondary hyperparathyroidism of renal origin: Secondary | ICD-10-CM | POA: Diagnosis not present

## 2018-09-24 DIAGNOSIS — N186 End stage renal disease: Secondary | ICD-10-CM | POA: Diagnosis not present

## 2018-09-24 DIAGNOSIS — J158 Pneumonia due to other specified bacteria: Secondary | ICD-10-CM | POA: Diagnosis not present

## 2018-09-24 DIAGNOSIS — D689 Coagulation defect, unspecified: Secondary | ICD-10-CM | POA: Diagnosis not present

## 2018-09-27 DIAGNOSIS — N186 End stage renal disease: Secondary | ICD-10-CM | POA: Diagnosis not present

## 2018-09-27 DIAGNOSIS — D689 Coagulation defect, unspecified: Secondary | ICD-10-CM | POA: Diagnosis not present

## 2018-09-27 DIAGNOSIS — D509 Iron deficiency anemia, unspecified: Secondary | ICD-10-CM | POA: Diagnosis not present

## 2018-09-27 DIAGNOSIS — N2581 Secondary hyperparathyroidism of renal origin: Secondary | ICD-10-CM | POA: Diagnosis not present

## 2018-09-27 DIAGNOSIS — J158 Pneumonia due to other specified bacteria: Secondary | ICD-10-CM | POA: Diagnosis not present

## 2018-09-27 DIAGNOSIS — Z992 Dependence on renal dialysis: Secondary | ICD-10-CM | POA: Diagnosis not present

## 2018-09-29 DIAGNOSIS — Z992 Dependence on renal dialysis: Secondary | ICD-10-CM | POA: Diagnosis not present

## 2018-09-29 DIAGNOSIS — D689 Coagulation defect, unspecified: Secondary | ICD-10-CM | POA: Diagnosis not present

## 2018-09-29 DIAGNOSIS — N2581 Secondary hyperparathyroidism of renal origin: Secondary | ICD-10-CM | POA: Diagnosis not present

## 2018-09-29 DIAGNOSIS — D509 Iron deficiency anemia, unspecified: Secondary | ICD-10-CM | POA: Diagnosis not present

## 2018-09-29 DIAGNOSIS — J158 Pneumonia due to other specified bacteria: Secondary | ICD-10-CM | POA: Diagnosis not present

## 2018-09-29 DIAGNOSIS — N186 End stage renal disease: Secondary | ICD-10-CM | POA: Diagnosis not present

## 2018-10-01 DIAGNOSIS — Z992 Dependence on renal dialysis: Secondary | ICD-10-CM | POA: Diagnosis not present

## 2018-10-01 DIAGNOSIS — D509 Iron deficiency anemia, unspecified: Secondary | ICD-10-CM | POA: Diagnosis not present

## 2018-10-01 DIAGNOSIS — D689 Coagulation defect, unspecified: Secondary | ICD-10-CM | POA: Diagnosis not present

## 2018-10-01 DIAGNOSIS — N2581 Secondary hyperparathyroidism of renal origin: Secondary | ICD-10-CM | POA: Diagnosis not present

## 2018-10-01 DIAGNOSIS — J158 Pneumonia due to other specified bacteria: Secondary | ICD-10-CM | POA: Diagnosis not present

## 2018-10-01 DIAGNOSIS — N186 End stage renal disease: Secondary | ICD-10-CM | POA: Diagnosis not present

## 2018-10-04 DIAGNOSIS — J158 Pneumonia due to other specified bacteria: Secondary | ICD-10-CM | POA: Diagnosis not present

## 2018-10-04 DIAGNOSIS — N2581 Secondary hyperparathyroidism of renal origin: Secondary | ICD-10-CM | POA: Diagnosis not present

## 2018-10-04 DIAGNOSIS — D509 Iron deficiency anemia, unspecified: Secondary | ICD-10-CM | POA: Diagnosis not present

## 2018-10-04 DIAGNOSIS — D689 Coagulation defect, unspecified: Secondary | ICD-10-CM | POA: Diagnosis not present

## 2018-10-04 DIAGNOSIS — Z992 Dependence on renal dialysis: Secondary | ICD-10-CM | POA: Diagnosis not present

## 2018-10-04 DIAGNOSIS — N186 End stage renal disease: Secondary | ICD-10-CM | POA: Diagnosis not present

## 2018-10-05 DIAGNOSIS — N186 End stage renal disease: Secondary | ICD-10-CM | POA: Diagnosis not present

## 2018-10-05 DIAGNOSIS — I12 Hypertensive chronic kidney disease with 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: Secondary | ICD-10-CM | POA: Diagnosis not present

## 2018-10-05 DIAGNOSIS — Z992 Dependence on renal dialysis: Secondary | ICD-10-CM | POA: Diagnosis not present

## 2018-10-06 DIAGNOSIS — N2581 Secondary hyperparathyroidism of renal origin: Secondary | ICD-10-CM | POA: Diagnosis not present

## 2018-10-06 DIAGNOSIS — D509 Iron deficiency anemia, unspecified: Secondary | ICD-10-CM | POA: Diagnosis not present

## 2018-10-06 DIAGNOSIS — J158 Pneumonia due to other specified bacteria: Secondary | ICD-10-CM | POA: Diagnosis not present

## 2018-10-06 DIAGNOSIS — D689 Coagulation defect, unspecified: Secondary | ICD-10-CM | POA: Diagnosis not present

## 2018-10-06 DIAGNOSIS — N186 End stage renal disease: Secondary | ICD-10-CM | POA: Diagnosis not present

## 2018-10-06 DIAGNOSIS — D631 Anemia in chronic kidney disease: Secondary | ICD-10-CM | POA: Diagnosis not present

## 2018-10-06 DIAGNOSIS — Z992 Dependence on renal dialysis: Secondary | ICD-10-CM | POA: Diagnosis not present

## 2018-10-08 DIAGNOSIS — D689 Coagulation defect, unspecified: Secondary | ICD-10-CM | POA: Diagnosis not present

## 2018-10-08 DIAGNOSIS — Z992 Dependence on renal dialysis: Secondary | ICD-10-CM | POA: Diagnosis not present

## 2018-10-08 DIAGNOSIS — N2581 Secondary hyperparathyroidism of renal origin: Secondary | ICD-10-CM | POA: Diagnosis not present

## 2018-10-08 DIAGNOSIS — J158 Pneumonia due to other specified bacteria: Secondary | ICD-10-CM | POA: Diagnosis not present

## 2018-10-08 DIAGNOSIS — D631 Anemia in chronic kidney disease: Secondary | ICD-10-CM | POA: Diagnosis not present

## 2018-10-08 DIAGNOSIS — N186 End stage renal disease: Secondary | ICD-10-CM | POA: Diagnosis not present

## 2018-10-10 ENCOUNTER — Other Ambulatory Visit: Payer: Self-pay | Admitting: Cardiology

## 2018-10-10 DIAGNOSIS — I10 Essential (primary) hypertension: Secondary | ICD-10-CM

## 2018-10-11 DIAGNOSIS — Z992 Dependence on renal dialysis: Secondary | ICD-10-CM | POA: Diagnosis not present

## 2018-10-11 DIAGNOSIS — D689 Coagulation defect, unspecified: Secondary | ICD-10-CM | POA: Diagnosis not present

## 2018-10-11 DIAGNOSIS — D631 Anemia in chronic kidney disease: Secondary | ICD-10-CM | POA: Diagnosis not present

## 2018-10-11 DIAGNOSIS — J158 Pneumonia due to other specified bacteria: Secondary | ICD-10-CM | POA: Diagnosis not present

## 2018-10-11 DIAGNOSIS — N2581 Secondary hyperparathyroidism of renal origin: Secondary | ICD-10-CM | POA: Diagnosis not present

## 2018-10-11 DIAGNOSIS — N186 End stage renal disease: Secondary | ICD-10-CM | POA: Diagnosis not present

## 2018-10-13 DIAGNOSIS — J158 Pneumonia due to other specified bacteria: Secondary | ICD-10-CM | POA: Diagnosis not present

## 2018-10-13 DIAGNOSIS — Z992 Dependence on renal dialysis: Secondary | ICD-10-CM | POA: Diagnosis not present

## 2018-10-13 DIAGNOSIS — N186 End stage renal disease: Secondary | ICD-10-CM | POA: Diagnosis not present

## 2018-10-13 DIAGNOSIS — D689 Coagulation defect, unspecified: Secondary | ICD-10-CM | POA: Diagnosis not present

## 2018-10-13 DIAGNOSIS — D631 Anemia in chronic kidney disease: Secondary | ICD-10-CM | POA: Diagnosis not present

## 2018-10-13 DIAGNOSIS — N2581 Secondary hyperparathyroidism of renal origin: Secondary | ICD-10-CM | POA: Diagnosis not present

## 2018-10-15 DIAGNOSIS — Z992 Dependence on renal dialysis: Secondary | ICD-10-CM | POA: Diagnosis not present

## 2018-10-15 DIAGNOSIS — D631 Anemia in chronic kidney disease: Secondary | ICD-10-CM | POA: Diagnosis not present

## 2018-10-15 DIAGNOSIS — N2581 Secondary hyperparathyroidism of renal origin: Secondary | ICD-10-CM | POA: Diagnosis not present

## 2018-10-15 DIAGNOSIS — N186 End stage renal disease: Secondary | ICD-10-CM | POA: Diagnosis not present

## 2018-10-15 DIAGNOSIS — D689 Coagulation defect, unspecified: Secondary | ICD-10-CM | POA: Diagnosis not present

## 2018-10-15 DIAGNOSIS — J158 Pneumonia due to other specified bacteria: Secondary | ICD-10-CM | POA: Diagnosis not present

## 2018-10-18 ENCOUNTER — Ambulatory Visit: Payer: Medicare Other | Admitting: Gastroenterology

## 2018-10-18 DIAGNOSIS — Z992 Dependence on renal dialysis: Secondary | ICD-10-CM | POA: Diagnosis not present

## 2018-10-18 DIAGNOSIS — N186 End stage renal disease: Secondary | ICD-10-CM | POA: Diagnosis not present

## 2018-10-18 DIAGNOSIS — N2581 Secondary hyperparathyroidism of renal origin: Secondary | ICD-10-CM | POA: Diagnosis not present

## 2018-10-18 DIAGNOSIS — J158 Pneumonia due to other specified bacteria: Secondary | ICD-10-CM | POA: Diagnosis not present

## 2018-10-18 DIAGNOSIS — D689 Coagulation defect, unspecified: Secondary | ICD-10-CM | POA: Diagnosis not present

## 2018-10-18 DIAGNOSIS — D631 Anemia in chronic kidney disease: Secondary | ICD-10-CM | POA: Diagnosis not present

## 2018-10-20 ENCOUNTER — Telehealth: Payer: Self-pay

## 2018-10-20 DIAGNOSIS — J158 Pneumonia due to other specified bacteria: Secondary | ICD-10-CM | POA: Diagnosis not present

## 2018-10-20 DIAGNOSIS — D631 Anemia in chronic kidney disease: Secondary | ICD-10-CM | POA: Diagnosis not present

## 2018-10-20 DIAGNOSIS — D689 Coagulation defect, unspecified: Secondary | ICD-10-CM | POA: Diagnosis not present

## 2018-10-20 DIAGNOSIS — N2581 Secondary hyperparathyroidism of renal origin: Secondary | ICD-10-CM | POA: Diagnosis not present

## 2018-10-20 DIAGNOSIS — N186 End stage renal disease: Secondary | ICD-10-CM | POA: Diagnosis not present

## 2018-10-20 DIAGNOSIS — Z992 Dependence on renal dialysis: Secondary | ICD-10-CM | POA: Diagnosis not present

## 2018-10-20 NOTE — Telephone Encounter (Signed)
Spoke with pt who is agreeable to a virtual visit with Dr. Orlean Patten on 9/22 at 9:40 am. Pt verbalized understanding and thank me for the call.     Virtual Visit Pre-Appointment Phone Call  "(Name), I am calling you today to discuss your upcoming appointment. We are currently trying to limit exposure to the virus that causes COVID-19 by seeing patients at home rather than in the office."  1. "What is the BEST phone number to call the day of the visit?" - include this in appointment notes  2. "Do you have or have access to (through a family member/friend) a smartphone with video capability that we can use for your visit?" a. If yes - list this number in appt notes as "cell" (if different from BEST phone #) and list the appointment type as a VIDEO visit in appointment notes b. If no - list the appointment type as a PHONE visit in appointment notes  3. Confirm consent - "In the setting of the current Covid19 crisis, you are scheduled for a (phone or video) visit with your provider on (date) at (time).  Just as we do with many in-office visits, in order for you to participate in this visit, we must obtain consent.  If you'd like, I can send this to your mychart (if signed up) or email for you to review.  Otherwise, I can obtain your verbal consent now.  All virtual visits are billed to your insurance company just like a normal visit would be.  By agreeing to a virtual visit, we'd like you to understand that the technology does not allow for your provider to perform an examination, and thus may limit your provider's ability to fully assess your condition. If your provider identifies any concerns that need to be evaluated in person, we will make arrangements to do so.  Finally, though the technology is pretty good, we cannot assure that it will always work on either your or our end, and in the setting of a video visit, we may have to convert it to a phone-only visit.  In either situation, we cannot ensure that  we have a secure connection.  Are you willing to proceed?" STAFF: Did the patient verbally acknowledge consent to telehealth visit? Document YES/NO here: Yes  4. Advise patient to be prepared - "Two hours prior to your appointment, go ahead and check your blood pressure, pulse, oxygen saturation, and your weight (if you have the equipment to check those) and write them all down. When your visit starts, your provider will ask you for this information. If you have an Apple Watch or Kardia device, please plan to have heart rate information ready on the day of your appointment. Please have a pen and paper handy nearby the day of the visit as well."  5. Give patient instructions for MyChart download to smartphone OR Doximity/[email or web address] as below if video visit (depending on what platform provider is using)  6. Inform patient they will receive a phone call 15 minutes prior to their appointment time (may be from unknown caller ID) so they should be prepared to answer    [name] has been deemed a candidate for a follow-up tele-health visit to limit community exposure during the Covid-19 pandemic. I spoke with the patient via phone to ensure availability of phone/video source, confirm preferred email & phone number, and discuss instructions and expectations.  I reminded James Wall to be prepared with any vital sign and/or  heart rhythm information that could potentially be obtained via home monitoring, at the time of his visit. I reminded James Wall to expect a phone call prior to his visit.  Jacinta Shoe, CMA 10/20/2018 1:58 PM   INSTRUCTIONS FOR DOWNLOADING THE MYCHART APP TO SMARTPHONE  - The patient must first make sure to have activated MyChart and know their login information - If Apple, go to [employer] and type in MyChart in the search bar and download the app. If Android, ask patient to go to [employer] and type in [place] in the search bar  and download the app. The app is free but as with any other app downloads, their phone may require them to verify saved payment information or Apple/Android password.  - The patient will need to then log into the app with their MyChart username and password, and select [facility] as their healthcare provider to link the account. When it is time for your visit, go to the MyChart app, find appointments, and click Begin Video Visit. Be sure to Select Allow for your device to access the Microphone and Camera for your visit. You will then be connected, and your provider will be with you shortly.  **If they have any issues connecting, or need assistance please contact MyChart service desk [phone])**  **If using a computer, in order to ensure the best quality for their visit they will need to use either of the following Internet Browsers: [employer], or Google Chrome**  IF USING DOXIMITY or DOXY.ME - The patient will receive a link just prior to their visit by text.     FULL LENGTH CONSENT FOR TELE-HEALTH VISIT   I hereby voluntarily request, consent and authorize [facility] and its employed or contracted physicians, physician assistants, nurse practitioners or other licensed health care professionals (the Practitioner), to provide me with telemedicine health care services (the "Services") as deemed necessary by the treating Practitioner. I acknowledge and consent to receive the Services by the Practitioner via telemedicine. I understand that the telemedicine visit will involve communicating with the Practitioner through live audiovisual communication technology and the disclosure of certain medical information by electronic transmission. I acknowledge that I have been given the opportunity to request an in-person assessment or other available alternative prior to the telemedicine visit and am voluntarily participating in the telemedicine visit.  I understand that I have the  right to withhold or withdraw my consent to the use of telemedicine in the course of my care at any time, without affecting my right to future care or treatment, and that the Practitioner or I may terminate the telemedicine visit at any time. I understand that I have the right to inspect all information obtained and/or recorded in the course of the telemedicine visit and may receive copies of available information for a reasonable fee.  I understand that some of the potential risks of receiving the Services via telemedicine include:  Marland Kitchen Delay or interruption in medical evaluation due to technological equipment failure or disruption; . Information transmitted may not be sufficient (e.g. poor resolution of images) to allow for appropriate medical decision making by the Practitioner; and/or  . In rare instances, security protocols could fail, causing a breach of personal health information.  Furthermore, I acknowledge that it is my responsibility to provide information about my medical history, conditions and care that is complete and accurate to the best of my ability. I acknowledge that Practitioner's advice, recommendations, and/or decision may be  based on factors not within their control, such as incomplete or inaccurate data provided by me or distortions of diagnostic images or specimens that may result from electronic transmissions. I understand that the practice of medicine is not an exact science and that Practitioner makes no warranties or guarantees regarding treatment outcomes. I acknowledge that I will receive a copy of this consent concurrently upon execution via email to the email address I last provided but may also request a printed copy by calling the office of [facility].    I understand that my insurance will be billed for this visit.   I have read or had this consent read to me. . I understand the contents of this consent, which adequately explains the benefits and risks of the Services  being provided via telemedicine.  . I have been provided ample opportunity to ask questions regarding this consent and the Services and have had my questions answered to my satisfaction. . I give my informed consent for the services to be provided through the use of telemedicine in my medical care  By participating in this telemedicine visit I agree to the above.

## 2018-10-21 ENCOUNTER — Other Ambulatory Visit (INDEPENDENT_AMBULATORY_CARE_PROVIDER_SITE_OTHER): Payer: Medicare Other

## 2018-10-21 ENCOUNTER — Encounter: Payer: Self-pay | Admitting: Gastroenterology

## 2018-10-21 ENCOUNTER — Ambulatory Visit (INDEPENDENT_AMBULATORY_CARE_PROVIDER_SITE_OTHER): Payer: Medicare Other | Admitting: Gastroenterology

## 2018-10-21 ENCOUNTER — Other Ambulatory Visit: Payer: Self-pay

## 2018-10-21 VITALS — BP 140/90 | HR 59 | Temp 97.4°F | Ht 65.0 in | Wt 93.0 lb

## 2018-10-21 DIAGNOSIS — K625 Hemorrhage of anus and rectum: Secondary | ICD-10-CM

## 2018-10-21 DIAGNOSIS — R112 Nausea with vomiting, unspecified: Secondary | ICD-10-CM

## 2018-10-21 LAB — IBC PANEL
Iron: 131 ug/dL (ref 42–165)
Saturation Ratios: 49.5 % (ref 20.0–50.0)
Transferrin: 189 mg/dL — ABNORMAL LOW (ref 212.0–360.0)

## 2018-10-21 LAB — FERRITIN: Ferritin: 60 ng/mL (ref 22.0–322.0)

## 2018-10-21 MED ORDER — PLENVU 140 G PO SOLR: 1.0000 | ORAL | 0 refills | Status: DC

## 2018-10-21 MED ORDER — PANTOPRAZOLE SODIUM 40 MG PO TBEC: 40.0000 mg | DELAYED_RELEASE_TABLET | Freq: Two times a day (BID) | ORAL | 3 refills | Status: DC

## 2018-10-21 NOTE — Patient Instructions (Signed)
You have been scheduled for a colonoscopy. Please follow written instructions given to you at your visit today.  Please pick up your prep supplies at the pharmacy within the next 1-3 days. If you use inhalers (even only as needed), please bring them with you on the day of your procedure.  Continue Pantoprazole 40 mg twice a day   Your provider has requested that you go to the basement level for lab work before leaving today. Press "B" on the elevator. The lab is located at the first door on the left as you exit the elevator.  You have been scheduled for an abdominal ultrasound at [facility] (1st floor of hospital) on 10-28-2018 at 9:00 am. Please arrive 15 minutes prior to your appointment for registration. Make certain not to have anything to eat or drink 6 hours prior to your appointment. Should you need to reschedule your appointment, please contact radiology at [phone]. This test typically takes about 30 minutes to perform.

## 2018-10-21 NOTE — Progress Notes (Signed)
Referring Provider: Sherene Sires, DO Primary Care Physician:  Sherene Sires, DO   Chief complaint:  vomiting   IMPRESSION:  Intermittent nausea and vomiting x4 years    - prior work up in 2015 included colonoscopy, EGD, CT, GES, and ultrasound    - previously on omeprazole 20 mg daily    - increased omeprazole to 40 mg BID    - UGI 07/29/18: mild esophageal dysmotility, small sliding hiatal hernia, mild proximal stomach thickening    - EGD 09/07/18: Widely patent Schatzki's ring, chronic nonspecific esophagitis, H pylori - gastritis, duodenitis Rectal bleeding - new since his last visit ? Cryptogenic cirrhosis    - Made at the time of the evaluation of ascites, although the SAAG was < 1.1    -  Elevated TP 5.8 suggests cardiac contribution    - platelets 206,000    - echogenic liver noted on ultrasound Microcytic anemia with a hemoglobin of 8.6 History of H pylori gastritis on EGD with Dr. Paulita Fujita 12/06/13    - unclear if full course of antibiotics were completed for treatment    - gastric biopsies negative for H pylori 09/07/18 History of colon polyps on colonoscopy 2013    - tubular adenoma 2013    - surveillance recommended 2018 ESRD on hemodialysis x 11 year (Dr. Carmina Miller) No diabetes  Outpatient workup of nausea in 2015 included a colonoscopy, egd, gastric emptying test, and ultrasound, all of which were negative for a cause of vomiting. Recent UGI/SBFT and EGD revealed no significant source of his symptoms, either. Differential continues to include: non-ulcer dyspepsia,  gastroparesis, functional nausea/vomigint and vomiting, and less likely bacterial overgrowth.   He thinks his symptoms are related to his dialysis, however, the nephrologist has not provided him with reassurance that this is the case and his symptoms occur daily without temporal relationship to dialysis. Nausea is frequent in dialysis patients. We could consider a trial of haloperidol 0.25 to 0.72 mg po QHS,  increasing to BID and then TID PRN or ondansetron 4-8 mg po BID PRN if symptoms persist.   No obvious diagnosis of cirrhosis based on review of records from Dr. Paulita Fujita. No evidence for varices or portal hypertension on recent upper endoscopy. Abdominal ultrasound 03/14/17 showed echogenic liver but no other findings except for renal cysts.   New rectal bleeding. History of adenoma with Dr. Paulita Fujita in 2015. The differential for rectal bleeding is broad.  It includes outlet sources such as fissure or hemorrhoids, as well as polyps, mass, ulcers, and colitis.  Given this differential I am recommending a colonoscopy.  PLAN: Continue pantoprazole to 40 mg BID (90 days with 3 refills) Iron panel to follow-up on the microcytic anemia with recent bleeding Abdominal ultrasound with HIDA scan if negative Minimize medications associated with nausea Colonoscopy for rectal bleeding and a history of polyps     - use Plenvu prep Follow-up in 2-3 months      I consented the patient discussing the risks, benefits, and alternatives to endoscopic evaluation. In particular, we discussed the risks that include, but are not limited to, reaction to medication, cardiopulmonary compromise, bleeding requiring blood transfusion, aspiration resulting in pneumonia, perforation requiring surgery, lack of diagnosis, severe illness requiring hospitalization, and even death. We reviewed the risk of missed lesion including polyps or even cancer. The patient acknowledges these risks and asks that we proceed.  HPI: James Wall is a 53 y.o. male referred by Dr. Criss Rosales for further evaluation of vomiting. His  initial consultation was   07/13/18. The interval history is obtained through the patient and review of his electronic health record.  Previously followed by [facility] (Dr. Paulita Fujita for 4-5 years), last seen in 2015. Patient requested new gastroenterologist as he didn't feel he was getting any follow-up after his colonscopy. He has  cryptogenic cirrhosis (no alcohol use, negative serology for hepatitis B and hepatitis C, ASMA and AMA negative, unremarkable ferritin, no varices on prior endoscopy) but there is no history of decompensation.  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 He has significant cramping while on the dialysis machine. He doesn't feel that he completes only 80 percent of his treatments.   Upper GI series 07/29/2018 showed a mild esophageal dysmotility with intermittent mild weakening of primary peristalsis in the lower esophagus, a small sliding hiatal hernia, and no reflux disease.  There is a mild fold thickening in the proximal stomach but no obvious ulcers.  There is rapid small bowel transit.  EGD 09/07/2018 showed: - Widely patent Schatzki ring. - Normal esophagus. Biopsied. - Erythematous mucosa in the gastric body. Biopsied. - Erythematous duodenopathy. Biopsied. - Small hiatal hernia. - No obvious finding to explain the UGI series findings.  Pathology results from the endoscopy showed: 1. Surgical [P], duodenal BX's - PATCHY, MILD CHRONIC DUODENITIS WITH SURFACE GASTRIC FOVEOLAR METAPLASIA, SUGGESTIVE OF MILD PEPTIC DUODENITIS 2. Surgical [P], gastric antrum and gastric body - GASTRIC ANTRAL AND OXYNTIC MUCOSA WITH NO SPECIFIC HISTOPATHOLOGIC CHANGES - WARTHIN STARRY STAIN IS NEGATIVE FOR HELICOBACTER PYLORI 3. Surgical [P], mid esophagus and distal esophagus - ESOPHAGEAL SQUAMOUS AND CARDIAC MUCOSA SHOWING CHRONIC NONSPECIFIC ESOPHAGITIS WITH RARE INTRAEPITHELIAL EOSINOPHILS (UP TO [place]). SEE NOTE - NEGATIVE FOR INTESTINAL METAPLASIA OR DYSPLASIA - PAS/F STAIN IS NEGATIVE FOR FUNGAL ORGANISMS  4-year history of intermittent nausea and vomiting worsened after his mother's death 3 years ago on Christmas morning. Less nausea following his endoscopy. Attributes this to using his PPI BID treatment. He is watching what he  eats, although he is still unable to identify food triggers.  Recent symptoms after eating onion rings. Smell may exacerbate her nausea.  No abdominal pain. No diarrhea or constipation.  He wonders if this may be symptomatic gallbladder disease.   Large amount of blood in the stool x 4 this year. No rectal pain. He understands that he has hemorrhoids.  No diarrhea or constipation. He is concerned that he needs a colonoscopy, as he is already overdue surveillance.   Continues to drink daily, but, tries to limit his intake and only drinks when eating.  Abdominal imaging: CT of the abdomen pelvis without contrast 03/08/2017 showed active pneumonia, changes of COPD, cardiomegaly,  bilateral inguinal hernias and a small umbilical hernia. An abdominal ultrasound 03/14/2017 showed an echogenic liver.  No other abdominal findings.  A gastric emptying scan was normal 10/30/2011.  Today I have reviewed prior records from Dr. Paulita Fujita: Last seen in 2015. Ascites with mixed picture. SAAG <1.1 argues against cirrhosis,Dr. Paulita Fujita suspected clinical cirrhosis. Elevated TP 5.8 suggests cardiac contribution .Evaluation for nausea/vomiting with gastric emptying scan, endoscopy,  abd [place]. Patient couldn't tolerate antibiotics for H pylori.  EGD 12/06/13 showed normal esophagus, mild HP gastritis, small hiatal hernia. Adenoma on colon. Labs 2103: ANA neg, HBsAb neg, HBsAg neg, HCV Ab neg,iron 50,ferritin 308   Past Medical History:  Diagnosis Date  . Anemia   . Anxiety   . Arthritis  GOUT - pt not sure if this is true  . 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 Blood transfusion without reported diagnosis   . Cirrhosis, nonalcoholic ([place])   . COPD (chronic obstructive pulmonary disease) ([place])   . Depression   . ESRD on hemodialysis [facility])    Started HD Jan 2009.  ESRD was due to HTN.  Dx'd with HTN in hospital 1996 according to pt, they had to keep him so he could get Medicaid to afford the BP medications.  First saw a  nephrologist and started HD in the same year 2009.  Gets HD at [facility] on a MWF schedule.  Does not have DM. He had a left RC AVF that never functioned, a left upper arm AVF that worked for about 5 years and as of June  . GERD (gastroesophageal reflux disease)   . Hypertension   . Pneumonia 03/2017  . Renal insufficiency   . Secondary hyperparathyroidism ([place])   . Sepsis ([place]) 02/2013   from AVF , treated with Vancomycin.  Marland Kitchen Shortness of breath    With exertion  . Sleep apnea    no  longer using cpap    Past Surgical History:  Procedure Laterality Date  . AV FISTULA PLACEMENT  2009   Left lower arm AVF  . AV FISTULA PLACEMENT Right 02/22/2013   Procedure:  CREATION  OF BRACHIAL CEPHALIC FISTULA RIGHT ARM;  Surgeon: Elam Dutch, MD;  Location: [place];  Service: Vascular;  Laterality: Right;  . AV FISTULA PLACEMENT Left 08/10/2014   Procedure: BASILIC VEIN TRANSPOSITION  ARTERIOVENOUS (AV) FISTULA CREATION LEFT UPPER ARM;  Surgeon: Mal Misty, MD;  Location: [place];  Service: Vascular;  Laterality: Left;  . COLONOSCOPY    . ESOPHAGOGASTRODUODENOSCOPY (EGD) WITH PROPOFOL N/A 04/12/2013   Procedure: ESOPHAGOGASTRODUODENOSCOPY (EGD) WITH PROPOFOL;  Surgeon: Arta Silence, MD;  Location: [facility];  Service: Endoscopy;  Laterality: N/A;  . INSERTION OF DIALYSIS CATHETER N/A 12/23/2012   Procedure: INSERTION OF DIALYSIS CATHETER; ULTRASOUND GUIDED;  Surgeon: Angelia Mould, MD;  Location[facility] OR;  Service: Vascular;  Laterality: N/A;  . INSERTION OF DIALYSIS CATHETER  10/22/2015   Right IJ non-tunneled HD catheter  . IR GENERIC HISTORICAL  10/22/2015   IR US GUIDE VASC ACCESS RIGHT 10/22/2015 [facility]  . IR GENERIC HISTORICAL  10/22/2015   IR FLUORO GUIDE CV LINE RIGHT 10/22/2015 [facility]  . IR GENERIC HISTORICAL  10/23/2015   IR FLUORO GUIDE CV LINE RIGHT 10/23/2015 Marybelle Killings, MD [facility]  . LEFT HEART CATHETERIZATION WITH CORONARY ANGIOGRAM N/A 07/13/2013    Procedure: LEFT HEART CATHETERIZATION WITH CORONARY ANGIOGRAM;  Surgeon: Jettie Booze, MD;  Location: [facility] CATH LAB;  Service: Cardiovascular;  Laterality: N/A;  . LIGATION OF ARTERIOVENOUS  FISTULA Left 12/22/2012   Procedure: LIGATION OF ARTERIOVENOUS  FISTULA;EXCISION OF LARGE ANEURYSMS;;  Surgeon: Elam Dutch, MD;  Location: [place];  Service: Vascular;  Laterality: Left;  . UPPER GASTROINTESTINAL ENDOSCOPY      Current Outpatient Medications  Medication Sig Dispense Refill  . acetaminophen (TYLENOL) 650 MG CR tablet Take 1 tablet (650 mg total) by mouth daily as needed for pain.    Marland Kitchen amLODipine (NORVASC) 10 MG tablet TAKE 1 TABLET(10 MG) BY MOUTH DAILY 90 tablet 0  . cloNIDine (CATAPRES - DOSED IN MG/24 HR) 0.3 mg/24hr Place 0.3 mg onto the skin every Sunday. Saturday.    . hydrOXYzine (ATARAX/VISTARIL) 25 MG tablet Take 1 tablet (25  mg total) by mouth every 8 (eight) hours as needed for itching. Further refills need to come from your PCP. 30 tablet 3  . ketoconazole (NIZORAL) 2 % cream APP EXT AA BID    . metoprolol tartrate (LOPRESSOR) 100 MG tablet Take 100 mg by mouth 2 (two) times daily.    . pantoprazole (PROTONIX) 40 MG tablet Take 1 tablet (40 mg total) by mouth 2 (two) times daily before a meal. 60 tablet 3  . SENSIPAR 60 MG tablet Take 1 tablet (60 mg total) by mouth every Monday, Wednesday, and Friday with hemodialysis. (Patient not taking: Reported on 09/07/2018)    . sevelamer carbonate (RENVELA) 800 MG tablet Take 3,200-4,800 mg by mouth See admin instructions. Take 4-6 tablets (3200-4800 mg) by mouth one to two times daily with meals    . tamsulosin (FLOMAX) 0.4 MG CAPS capsule Take 0.4 mg by mouth every other day.   0   No current facility-administered medications for this visit.     Allergies as of 10/21/2018 - Review Complete 09/07/2018  Allergen Reaction Noted  . Aspirin Other (See Comments) 04/06/2008    Family History  Problem Relation Age of Onset  .  Hypertension Mother   . Cerebrovascular Accident Father   . Hypertension Father   . Congestive Heart Failure Brother   . Asthma Brother   . Stomach cancer Neg Hx   . Rectal cancer Neg Hx   . Esophageal cancer Neg Hx   . Colon cancer Neg Hx     Social History   Socioeconomic History  . Marital status: Single    Spouse name: Not on file  . Number of children: 0  . Years of education: 12th  . Highest education level: Not on file  Occupational History  . Occupation: n/a  Social Needs  . Financial resource strain: Not on file  . Food insecurity    Worry: Not on file    Inability: Not on file  . Transportation needs    Medical: Not on file    Non-medical: Not on file  Tobacco Use  . Smoking status: Current Every Day Smoker    Packs/day: 0.25    Years: 16.00    Pack years: 4.00    Types: Cigarettes    Last attempt to quit: 03/03/2017    Years since quitting: 1.6  . Smokeless tobacco: Never Used  Substance and Sexual Activity  . Alcohol use: Yes    Alcohol/week: 0.0 standard drinks    Comment: rare  . Drug use: Yes    Frequency: 2.0 times per week    Types: Marijuana  . Sexual activity: Not on file  Lifestyle  . Physical activity    Days per week: Not on file    Minutes per session: Not on file  . Stress: Not on file  Relationships  . Social Herbalist on phone: Not on file    Gets together: Not on file    Attends religious service: Not on file    Active member of club or organization: Not on file    Attends meetings of clubs or organizations: Not on file    Relationship status: Not on file  . Intimate partner violence    Fear of current or ex partner: Not on file    Emotionally abused: Not on file    Physically abused: Not on file    Forced sexual activity: Not on file  Other Topics Concern  . Not on  file  Social History Narrative  . Not on file   Physical Exam: General:   Alert,  well-nourished, pleasant and cooperative, in NAD.   Head:   Normocephalic and atraumatic. Eyes:  Sclera clear, no icterus.   Conjunctiva pink. Ears:  Normal auditory acuity. Nose:  No deformity, discharge,  or lesions. Mouth:  No deformity or lesions.   Neck:  Supple; no masses or thyromegaly. Lungs:  Clear throughout to auscultation.   No wheezes. Heart:  Regular rate and rhythm; 2+ SEM. Abdomen:  Soft,nontender, nondistended, normal bowel sounds, no rebound or guarding. No hepatosplenomegaly.  No obvious ascites.  No abdominal wall hernias.  No succession splash. Rectal:  Deferred  Msk:  Symmetrical. No boney deformities LAD: No inguinal or umbilical LAD Extremities:  No clubbing or edema. Neurologic:  Alert and  oriented x4;  grossly nonfocal Skin:  Intact without significant lesions or rashes. Psych:  Alert and cooperative. Normal mood and affect.   Somer Trotter L. Tarri Glenn, MD, MPH [facility] 10/21/2018, 8:28 AM

## 2018-10-22 DIAGNOSIS — N2581 Secondary hyperparathyroidism of renal origin: Secondary | ICD-10-CM | POA: Diagnosis not present

## 2018-10-22 DIAGNOSIS — J158 Pneumonia due to other specified bacteria: Secondary | ICD-10-CM | POA: Diagnosis not present

## 2018-10-22 DIAGNOSIS — N186 End stage renal disease: Secondary | ICD-10-CM | POA: Diagnosis not present

## 2018-10-22 DIAGNOSIS — D631 Anemia in chronic kidney disease: Secondary | ICD-10-CM | POA: Diagnosis not present

## 2018-10-22 DIAGNOSIS — D689 Coagulation defect, unspecified: Secondary | ICD-10-CM | POA: Diagnosis not present

## 2018-10-22 DIAGNOSIS — Z992 Dependence on renal dialysis: Secondary | ICD-10-CM | POA: Diagnosis not present

## 2018-10-25 ENCOUNTER — Telehealth: Payer: Self-pay

## 2018-10-25 DIAGNOSIS — J158 Pneumonia due to other specified bacteria: Secondary | ICD-10-CM | POA: Diagnosis not present

## 2018-10-25 DIAGNOSIS — N2581 Secondary hyperparathyroidism of renal origin: Secondary | ICD-10-CM | POA: Diagnosis not present

## 2018-10-25 DIAGNOSIS — D631 Anemia in chronic kidney disease: Secondary | ICD-10-CM | POA: Diagnosis not present

## 2018-10-25 DIAGNOSIS — D689 Coagulation defect, unspecified: Secondary | ICD-10-CM | POA: Diagnosis not present

## 2018-10-25 DIAGNOSIS — Z992 Dependence on renal dialysis: Secondary | ICD-10-CM | POA: Diagnosis not present

## 2018-10-25 DIAGNOSIS — N186 End stage renal disease: Secondary | ICD-10-CM | POA: Diagnosis not present

## 2018-10-25 NOTE — Telephone Encounter (Signed)
Spoke to pt on Monday, 10/25/18. Reviewed meds and made him aware that we would be calling him tomorrow, 10/26/18 to obtain vitals 15 mins before appt time. Won't be able to obtain vitals. Pt states he didn't have access to a BP cuff.James KitchenMarland KitchenMarland KitchenMarland Kitchenjb

## 2018-10-26 ENCOUNTER — Encounter: Payer: Self-pay | Admitting: Cardiology

## 2018-10-26 ENCOUNTER — Telehealth: Payer: Self-pay | Admitting: Licensed Clinical Social Worker

## 2018-10-26 ENCOUNTER — Other Ambulatory Visit: Payer: Self-pay

## 2018-10-26 ENCOUNTER — Telehealth (INDEPENDENT_AMBULATORY_CARE_PROVIDER_SITE_OTHER): Payer: Medicare Other | Admitting: Cardiology

## 2018-10-26 DIAGNOSIS — I5042 Chronic combined systolic (congestive) and diastolic (congestive) heart failure: Secondary | ICD-10-CM | POA: Diagnosis not present

## 2018-10-26 DIAGNOSIS — I10 Essential (primary) hypertension: Secondary | ICD-10-CM

## 2018-10-26 DIAGNOSIS — I272 Pulmonary hypertension, unspecified: Secondary | ICD-10-CM

## 2018-10-26 NOTE — Telephone Encounter (Signed)
CSW referred to assist patient with obtaining a BP cuff. CSW contacted patient to inform cuff will be delivered to home. Patient grateful for support and assistance. CSW available as needed. Jackie Oren Barella, LCSW, CCSW-MCS [phone]  

## 2018-10-26 NOTE — Patient Instructions (Signed)
Medication Instructions:   Your physician recommends that you continue on your current medications as directed. Please refer to the Current Medication list given to you today.  If you need a refill on your cardiac medications before your next appointment, please call your pharmacy.     Follow-Up: At [facility], you and your health needs are our priority.  As part of our continuing mission to provide you with exceptional heart care, we have created designated Provider Care Teams.  These Care Teams include your primary Cardiologist (physician) and Advanced Practice Providers (APPs -  Physician Assistants and Nurse Practitioners) who all work together to provide you with the care you need, when you need it. You will need a follow up appointment in 4 months with Dr. Meda Coffee in person.  Please call our office 2 months in advance to schedule this appointment.  You may see Dr. Meda Coffee or one of the following Advanced Practice Providers on your designated Care Team:   [place], PA-C Melina Copa, PA-C . Ermalinda Barrios, PA-C   Any Other Special Instructions Will Be Listed Below (If Applicable).  --WE HAVE REFERRED YOU TO GET A FREE BP CUFF DELIVERED TO YOUR HOME ADDRESS BY OUR SOCIAL WORKER J. BRENNAN ONCE YOU RECEIVE THIS IN THE MAIL, DR. Meda Coffee WANTS YOU TO TAKE YOUR BP DAILY, WRITE THIS DOWN FOR ONE WEEK, THEN CALL us BACK AT [phone] TO REPORT TO OUR OPERATORS YOUR NUMBERS YOU RECORDED.  THEY WILL GET THIS TO DR. Meda Coffee AND MYSELF TO REVIEW, AND WE WILL CALL YOU THEREAFTER WITH FURTHER RECOMMENDATIONS AS NEEDED.--

## 2018-10-26 NOTE — Progress Notes (Signed)
Virtual Visit via Telephone Note   This visit type was conducted due to national recommendations for restrictions regarding the COVID-19 Pandemic (e.g. social distancing) in an effort to limit this patient's exposure and mitigate transmission in our community.  Due to his co-morbid illnesses, this patient is at least at moderate risk for complications without adequate follow up.  This format is felt to be most appropriate for this patient at this time.  The patient did not have access to video technology/had technical difficulties with video requiring transitioning to audio format only (telephone).  All issues noted in this document were discussed and addressed.  No physical exam could be performed with this format.  Please refer to the patient's chart for his  consent to telehealth for [facility].   Date:  10/26/2018   ID:  James Wall, James Wall 07/08/1965, MRN [id number]  Patient Location: Home Provider Location: Home  PCP:  Sherene Sires, DO  Cardiologist:  No primary care provider on file.  Electrophysiologist:  None   Evaluation Performed:  Follow-Up Visit  Chief Complaint/Reason for visit:  1 year follow up  History of Present Illness:    James Wall is a 53 y.o. male with history of hypertension that subsequently led to end-stage renal disease on renal hemodialysis for the last 6 years. The patient also has a diagnosis of liver cirrhosis of unknown etiology. The patient is coming with concerns of fatigue, shortness of breath, and weight loss. The patient states that he has lost significant amount of weight in the last 2 years. He is minimally active as he feels as he has no energy. And he gets short of breath with minimal activity. The patient denies any chest pain. He has also been experiencing palpitations associated with shortness of breath at the end of his dialysis.   10/26/2018 - 1 year follow up, he is doing ok, BP remains elevated however previously ad episodes of  hypotension at the HD center. He hasn't had LE edema in a year, no orthopnea, PND, no chest pain. He walks few blocks every day without significant DOE.  The patient does not have symptoms concerning for COVID-19 infection (fever, chills, cough, or new shortness of breath).   Past Medical History:  Diagnosis Date  . Anemia   . Anxiety   . Arthritis    GOUT - pt not sure if this is true  . AV fistula occlusion ([place]) 10/2015  . Blood transfusion without reported diagnosis   . Cirrhosis, nonalcoholic ([place])   . COPD (chronic obstructive pulmonary disease) ([place])   . Depression   . ESRD on hemodialysis [facility])    Started HD Jan 2009.  ESRD was due to HTN.  Dx'd with HTN in hospital 1996 according to pt, they had to keep him so he could get Medicaid to afford the BP medications.  First saw a nephrologist and started HD in the same year 2009.  Gets HD at [facility] on a MWF schedule.  Does not have DM. He had a left RC AVF that never functioned, a left upper arm AVF that worked for about 5 years and as of June  . GERD (gastroesophageal reflux disease)   . Hypertension   . Pneumonia 03/2017  . Renal insufficiency   . Secondary hyperparathyroidism ([place])   . Sepsis ([place]) 02/2013   from AVF , treated with Vancomycin.  Marland Kitchen Shortness of breath    With exertion  . Sleep apnea    no  longer using cpap   Past Surgical History:  Procedure Laterality Date  . AV FISTULA PLACEMENT  2009   Left lower arm AVF  . AV FISTULA PLACEMENT Right 02/22/2013   Procedure:  CREATION  OF BRACHIAL CEPHALIC FISTULA RIGHT ARM;  Surgeon: Elam Dutch, MD;  Location: [place];  Service: Vascular;  Laterality: Right;  . AV FISTULA PLACEMENT Left 08/10/2014   Procedure: BASILIC VEIN TRANSPOSITION  ARTERIOVENOUS (AV) FISTULA CREATION LEFT UPPER ARM;  Surgeon: Mal Misty, MD;  Location: [place];  Service: Vascular;  Laterality: Left;  . COLONOSCOPY    . ESOPHAGOGASTRODUODENOSCOPY (EGD) WITH PROPOFOL N/A 04/12/2013   Procedure:  ESOPHAGOGASTRODUODENOSCOPY (EGD) WITH PROPOFOL;  Surgeon: Arta Silence, MD;  Location: [facility];  Service: Endoscopy;  Laterality: N/A;  . INSERTION OF DIALYSIS CATHETER N/A 12/23/2012   Procedure: INSERTION OF DIALYSIS CATHETER; ULTRASOUND GUIDED;  Surgeon: Angelia Mould, MD;  Location: [facility] OR;  Service: Vascular;  Laterality: N/A;  . INSERTION OF DIALYSIS CATHETER  10/22/2015   Right IJ non-tunneled HD catheter  . IR GENERIC HISTORICAL  10/22/2015   IR US GUIDE VASC ACCESS RIGHT 10/22/2015 [facility]  . IR GENERIC HISTORICAL  10/22/2015   IR FLUORO GUIDE CV LINE RIGHT 10/22/2015 [facility]  . IR GENERIC HISTORICAL  10/23/2015   IR FLUORO GUIDE CV LINE RIGHT 10/23/2015 Marybelle Killings, MD [facility]  . LEFT HEART CATHETERIZATION WITH CORONARY ANGIOGRAM N/A 07/13/2013   Procedure: LEFT HEART CATHETERIZATION WITH CORONARY ANGIOGRAM;  Surgeon: Jettie Booze, MD;  Location: [facility] CATH LAB;  Service: Cardiovascular;  Laterality: N/A;  . LIGATION OF ARTERIOVENOUS  FISTULA Left 12/22/2012   Procedure: LIGATION OF ARTERIOVENOUS  FISTULA;EXCISION OF LARGE ANEURYSMS;;  Surgeon: Elam Dutch, MD;  Location: [place];  Service: Vascular;  Laterality: Left;  . UPPER GASTROINTESTINAL ENDOSCOPY      No outpatient medications have been marked as taking for the 10/26/18 encounter (Telemedicine) with Dorothy Spark, MD.    Allergies:   Aspirin   Social History   Tobacco Use  . Smoking status: Current Every Day Smoker    Packs/day: 0.25    Years: 16.00    Pack years: 4.00    Types: Cigarettes    Last attempt to quit: 03/03/2017    Years since quitting: 1.6  . Smokeless tobacco: Never Used  Substance Use Topics  . Alcohol use: Yes    Alcohol/week: 0.0 standard drinks    Comment: rare  . Drug use: Yes    Frequency: 2.0 times per week    Types: Marijuana     Family Hx: The patient's family history includes Asthma in his brother; Cerebrovascular Accident in his father; Congestive  Heart Failure in his brother; Hypertension in his father and mother. There is no history of Stomach cancer, Rectal cancer, Esophageal cancer, or Colon cancer.  ROS:   Please see the history of present illness.    All other systems reviewed and are negative.  Prior CV studies:   The following studies were reviewed today:  TTE: 12/2017 - Left ventricle: Septal and inferior basal hypokinesis. The cavity   size was mildly dilated. Wall thickness was normal. Systolic   function was mildly reduced. The estimated ejection fraction was   in the range of 45% to 50%. Doppler parameters are consistent   with both elevated ventricular end-diastolic filling pressure and   elevated left atrial filling pressure. - Aortic valve: There was trivial regurgitation. - Mitral valve: Moderately calcified annulus.  Moderately thickened,   moderately calcified leaflets . There was mild to moderate   regurgitation. - Left atrium: The atrium was moderately dilated. - Right atrium: The atrium was mildly dilated. - Atrial septum: No defect or patent foramen ovale was identified. - Tricuspid valve: There was moderate regurgitation. - Pulmonary arteries: PA peak pressure: 97 mm Hg (S). - Impressions: Severe pulmonary hypertension based on TR velocity.  Impressions: - Severe pulmonary hypertension based on TR velocity.  Labs/Other Tests and Data Reviewed:    EKG:  No ECG reviewed.  Recent Labs: 12/22/2017: ALT 12; B Natriuretic Peptide >4,500.0; Magnesium 2.2 12/25/2017: BUN 19; Creatinine, Ser 8.35; Hemoglobin 8.6; Platelets 206; Potassium 3.9; Sodium 136   Recent Lipid Panel No results found for: CHOL, TRIG, HDL, CHOLHDL, LDLCALC, LDLDIRECT  Wt Readings from Last 3 Encounters:  10/26/18 191 lb (86.6 kg)  10/21/18 93 lb (42.2 kg)  09/07/18 180 lb (81.6 kg)     Objective:    Vital Signs:  Wt 191 lb (86.6 kg)   BMI 31.78 kg/m    VITAL SIGNS:  reviewed  ASSESSMENT & PLAN:    Difficult to  control hypertension - continue current regimen, we will send him a free BP cuff and he will provide BP diary. We will adjust meds based on that.  Chronic combined systolic and diastolic CHF - appears euvolemic, volume managed by HD, ideally we should wean of clonidine and start hydralazine/imdur combination  Severe pulmonary hypertension- ideally we should wean of clonidine and start hydralazine/imdur combination  Palpitations - resolved  Dyspnea on exertion -resolved.  COVID-19 Education: The signs and symptoms of COVID-19 were discussed with the patient and how to seek care for testing (follow up with PCP or arrange E-visit).  The importance of social distancing was discussed today.  Time:   Today, I have spent 25 minutes with the patient with telehealth technology discussing the above problems.     Medication Adjustments/Labs and Tests Ordered: Current medicines are reviewed at length with the patient today.  Concerns regarding medicines are outlined above.   Tests Ordered: No orders of the defined types were placed in this encounter.   Medication Changes: No orders of the defined types were placed in this encounter.   Follow Up:  In Person in 4 month(s)  Signed, Ena Dawley, MD  10/26/2018 10:34 AM    [facility]

## 2018-10-27 DIAGNOSIS — Z992 Dependence on renal dialysis: Secondary | ICD-10-CM | POA: Diagnosis not present

## 2018-10-27 DIAGNOSIS — N2581 Secondary hyperparathyroidism of renal origin: Secondary | ICD-10-CM | POA: Diagnosis not present

## 2018-10-27 DIAGNOSIS — D689 Coagulation defect, unspecified: Secondary | ICD-10-CM | POA: Diagnosis not present

## 2018-10-27 DIAGNOSIS — N186 End stage renal disease: Secondary | ICD-10-CM | POA: Diagnosis not present

## 2018-10-27 DIAGNOSIS — D631 Anemia in chronic kidney disease: Secondary | ICD-10-CM | POA: Diagnosis not present

## 2018-10-27 DIAGNOSIS — J158 Pneumonia due to other specified bacteria: Secondary | ICD-10-CM | POA: Diagnosis not present

## 2018-10-28 ENCOUNTER — Ambulatory Visit (HOSPITAL_COMMUNITY): Payer: Medicare Other | Attending: Gastroenterology

## 2018-10-29 DIAGNOSIS — Z992 Dependence on renal dialysis: Secondary | ICD-10-CM | POA: Diagnosis not present

## 2018-10-29 DIAGNOSIS — N2581 Secondary hyperparathyroidism of renal origin: Secondary | ICD-10-CM | POA: Diagnosis not present

## 2018-10-29 DIAGNOSIS — J158 Pneumonia due to other specified bacteria: Secondary | ICD-10-CM | POA: Diagnosis not present

## 2018-10-29 DIAGNOSIS — D631 Anemia in chronic kidney disease: Secondary | ICD-10-CM | POA: Diagnosis not present

## 2018-10-29 DIAGNOSIS — D689 Coagulation defect, unspecified: Secondary | ICD-10-CM | POA: Diagnosis not present

## 2018-10-29 DIAGNOSIS — N186 End stage renal disease: Secondary | ICD-10-CM | POA: Diagnosis not present

## 2018-11-01 DIAGNOSIS — D631 Anemia in chronic kidney disease: Secondary | ICD-10-CM | POA: Diagnosis not present

## 2018-11-01 DIAGNOSIS — Z992 Dependence on renal dialysis: Secondary | ICD-10-CM | POA: Diagnosis not present

## 2018-11-01 DIAGNOSIS — N186 End stage renal disease: Secondary | ICD-10-CM | POA: Diagnosis not present

## 2018-11-01 DIAGNOSIS — J158 Pneumonia due to other specified bacteria: Secondary | ICD-10-CM | POA: Diagnosis not present

## 2018-11-01 DIAGNOSIS — N2581 Secondary hyperparathyroidism of renal origin: Secondary | ICD-10-CM | POA: Diagnosis not present

## 2018-11-01 DIAGNOSIS — D689 Coagulation defect, unspecified: Secondary | ICD-10-CM | POA: Diagnosis not present

## 2018-11-03 DIAGNOSIS — N2581 Secondary hyperparathyroidism of renal origin: Secondary | ICD-10-CM | POA: Diagnosis not present

## 2018-11-03 DIAGNOSIS — Z992 Dependence on renal dialysis: Secondary | ICD-10-CM | POA: Diagnosis not present

## 2018-11-03 DIAGNOSIS — D631 Anemia in chronic kidney disease: Secondary | ICD-10-CM | POA: Diagnosis not present

## 2018-11-03 DIAGNOSIS — N186 End stage renal disease: Secondary | ICD-10-CM | POA: Diagnosis not present

## 2018-11-03 DIAGNOSIS — J158 Pneumonia due to other specified bacteria: Secondary | ICD-10-CM | POA: Diagnosis not present

## 2018-11-03 DIAGNOSIS — D689 Coagulation defect, unspecified: Secondary | ICD-10-CM | POA: Diagnosis not present

## 2018-11-04 DIAGNOSIS — I12 Hypertensive chronic kidney disease with 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: Secondary | ICD-10-CM | POA: Diagnosis not present

## 2018-11-04 DIAGNOSIS — N186 End stage renal disease: Secondary | ICD-10-CM | POA: Diagnosis not present

## 2018-11-04 DIAGNOSIS — Z992 Dependence on renal dialysis: Secondary | ICD-10-CM | POA: Diagnosis not present

## 2018-11-05 DIAGNOSIS — D509 Iron deficiency anemia, unspecified: Secondary | ICD-10-CM | POA: Diagnosis not present

## 2018-11-05 DIAGNOSIS — N2581 Secondary hyperparathyroidism of renal origin: Secondary | ICD-10-CM | POA: Diagnosis not present

## 2018-11-05 DIAGNOSIS — Z992 Dependence on renal dialysis: Secondary | ICD-10-CM | POA: Diagnosis not present

## 2018-11-05 DIAGNOSIS — J158 Pneumonia due to other specified bacteria: Secondary | ICD-10-CM | POA: Diagnosis not present

## 2018-11-05 DIAGNOSIS — N186 End stage renal disease: Secondary | ICD-10-CM | POA: Diagnosis not present

## 2018-11-05 DIAGNOSIS — D631 Anemia in chronic kidney disease: Secondary | ICD-10-CM | POA: Diagnosis not present

## 2018-11-08 DIAGNOSIS — J158 Pneumonia due to other specified bacteria: Secondary | ICD-10-CM | POA: Diagnosis not present

## 2018-11-08 DIAGNOSIS — N186 End stage renal disease: Secondary | ICD-10-CM | POA: Diagnosis not present

## 2018-11-08 DIAGNOSIS — N2581 Secondary hyperparathyroidism of renal origin: Secondary | ICD-10-CM | POA: Diagnosis not present

## 2018-11-08 DIAGNOSIS — D509 Iron deficiency anemia, unspecified: Secondary | ICD-10-CM | POA: Diagnosis not present

## 2018-11-08 DIAGNOSIS — D631 Anemia in chronic kidney disease: Secondary | ICD-10-CM | POA: Diagnosis not present

## 2018-11-08 DIAGNOSIS — Z992 Dependence on renal dialysis: Secondary | ICD-10-CM | POA: Diagnosis not present

## 2018-11-10 DIAGNOSIS — J158 Pneumonia due to other specified bacteria: Secondary | ICD-10-CM | POA: Diagnosis not present

## 2018-11-10 DIAGNOSIS — D631 Anemia in chronic kidney disease: Secondary | ICD-10-CM | POA: Diagnosis not present

## 2018-11-10 DIAGNOSIS — Z992 Dependence on renal dialysis: Secondary | ICD-10-CM | POA: Diagnosis not present

## 2018-11-10 DIAGNOSIS — N186 End stage renal disease: Secondary | ICD-10-CM | POA: Diagnosis not present

## 2018-11-10 DIAGNOSIS — N2581 Secondary hyperparathyroidism of renal origin: Secondary | ICD-10-CM | POA: Diagnosis not present

## 2018-11-10 DIAGNOSIS — D509 Iron deficiency anemia, unspecified: Secondary | ICD-10-CM | POA: Diagnosis not present

## 2018-11-12 DIAGNOSIS — N186 End stage renal disease: Secondary | ICD-10-CM | POA: Diagnosis not present

## 2018-11-12 DIAGNOSIS — Z992 Dependence on renal dialysis: Secondary | ICD-10-CM | POA: Diagnosis not present

## 2018-11-12 DIAGNOSIS — D509 Iron deficiency anemia, unspecified: Secondary | ICD-10-CM | POA: Diagnosis not present

## 2018-11-12 DIAGNOSIS — J158 Pneumonia due to other specified bacteria: Secondary | ICD-10-CM | POA: Diagnosis not present

## 2018-11-12 DIAGNOSIS — N2581 Secondary hyperparathyroidism of renal origin: Secondary | ICD-10-CM | POA: Diagnosis not present

## 2018-11-12 DIAGNOSIS — D631 Anemia in chronic kidney disease: Secondary | ICD-10-CM | POA: Diagnosis not present

## 2018-11-15 DIAGNOSIS — D509 Iron deficiency anemia, unspecified: Secondary | ICD-10-CM | POA: Diagnosis not present

## 2018-11-15 DIAGNOSIS — N2581 Secondary hyperparathyroidism of renal origin: Secondary | ICD-10-CM | POA: Diagnosis not present

## 2018-11-15 DIAGNOSIS — Z992 Dependence on renal dialysis: Secondary | ICD-10-CM | POA: Diagnosis not present

## 2018-11-15 DIAGNOSIS — D631 Anemia in chronic kidney disease: Secondary | ICD-10-CM | POA: Diagnosis not present

## 2018-11-15 DIAGNOSIS — J158 Pneumonia due to other specified bacteria: Secondary | ICD-10-CM | POA: Diagnosis not present

## 2018-11-15 DIAGNOSIS — N186 End stage renal disease: Secondary | ICD-10-CM | POA: Diagnosis not present

## 2018-11-17 DIAGNOSIS — D631 Anemia in chronic kidney disease: Secondary | ICD-10-CM | POA: Diagnosis not present

## 2018-11-17 DIAGNOSIS — N186 End stage renal disease: Secondary | ICD-10-CM | POA: Diagnosis not present

## 2018-11-17 DIAGNOSIS — D509 Iron deficiency anemia, unspecified: Secondary | ICD-10-CM | POA: Diagnosis not present

## 2018-11-17 DIAGNOSIS — J158 Pneumonia due to other specified bacteria: Secondary | ICD-10-CM | POA: Diagnosis not present

## 2018-11-17 DIAGNOSIS — N2581 Secondary hyperparathyroidism of renal origin: Secondary | ICD-10-CM | POA: Diagnosis not present

## 2018-11-17 DIAGNOSIS — Z992 Dependence on renal dialysis: Secondary | ICD-10-CM | POA: Diagnosis not present

## 2018-11-19 DIAGNOSIS — Z992 Dependence on renal dialysis: Secondary | ICD-10-CM | POA: Diagnosis not present

## 2018-11-19 DIAGNOSIS — N186 End stage renal disease: Secondary | ICD-10-CM | POA: Diagnosis not present

## 2018-11-19 DIAGNOSIS — N2581 Secondary hyperparathyroidism of renal origin: Secondary | ICD-10-CM | POA: Diagnosis not present

## 2018-11-19 DIAGNOSIS — D509 Iron deficiency anemia, unspecified: Secondary | ICD-10-CM | POA: Diagnosis not present

## 2018-11-19 DIAGNOSIS — D631 Anemia in chronic kidney disease: Secondary | ICD-10-CM | POA: Diagnosis not present

## 2018-11-19 DIAGNOSIS — J158 Pneumonia due to other specified bacteria: Secondary | ICD-10-CM | POA: Diagnosis not present

## 2018-11-22 ENCOUNTER — Other Ambulatory Visit: Payer: Self-pay | Admitting: *Deleted

## 2018-11-22 ENCOUNTER — Telehealth: Payer: Self-pay | Admitting: Gastroenterology

## 2018-11-22 DIAGNOSIS — N186 End stage renal disease: Secondary | ICD-10-CM | POA: Diagnosis not present

## 2018-11-22 DIAGNOSIS — R112 Nausea with vomiting, unspecified: Secondary | ICD-10-CM

## 2018-11-22 DIAGNOSIS — D631 Anemia in chronic kidney disease: Secondary | ICD-10-CM | POA: Diagnosis not present

## 2018-11-22 DIAGNOSIS — N2581 Secondary hyperparathyroidism of renal origin: Secondary | ICD-10-CM | POA: Diagnosis not present

## 2018-11-22 DIAGNOSIS — Z992 Dependence on renal dialysis: Secondary | ICD-10-CM | POA: Diagnosis not present

## 2018-11-22 DIAGNOSIS — D509 Iron deficiency anemia, unspecified: Secondary | ICD-10-CM | POA: Diagnosis not present

## 2018-11-22 DIAGNOSIS — J158 Pneumonia due to other specified bacteria: Secondary | ICD-10-CM | POA: Diagnosis not present

## 2018-11-22 NOTE — Telephone Encounter (Signed)
Patient scheduled for the following, patient aware:   Abd [place] complete at 8:00 am on 11/3 at [facility]  HIDA scan at 9:30 am on 11/3 at [facility]  NPO after midnight, patient aware.

## 2018-11-24 DIAGNOSIS — N2581 Secondary hyperparathyroidism of renal origin: Secondary | ICD-10-CM | POA: Diagnosis not present

## 2018-11-24 DIAGNOSIS — J158 Pneumonia due to other specified bacteria: Secondary | ICD-10-CM | POA: Diagnosis not present

## 2018-11-24 DIAGNOSIS — Z992 Dependence on renal dialysis: Secondary | ICD-10-CM | POA: Diagnosis not present

## 2018-11-24 DIAGNOSIS — N186 End stage renal disease: Secondary | ICD-10-CM | POA: Diagnosis not present

## 2018-11-24 DIAGNOSIS — D509 Iron deficiency anemia, unspecified: Secondary | ICD-10-CM | POA: Diagnosis not present

## 2018-11-24 DIAGNOSIS — D631 Anemia in chronic kidney disease: Secondary | ICD-10-CM | POA: Diagnosis not present

## 2018-11-26 ENCOUNTER — Encounter: Payer: Self-pay | Admitting: *Deleted

## 2018-11-26 DIAGNOSIS — J158 Pneumonia due to other specified bacteria: Secondary | ICD-10-CM | POA: Diagnosis not present

## 2018-11-26 DIAGNOSIS — N186 End stage renal disease: Secondary | ICD-10-CM | POA: Diagnosis not present

## 2018-11-26 DIAGNOSIS — D631 Anemia in chronic kidney disease: Secondary | ICD-10-CM | POA: Diagnosis not present

## 2018-11-26 DIAGNOSIS — Z992 Dependence on renal dialysis: Secondary | ICD-10-CM | POA: Diagnosis not present

## 2018-11-26 DIAGNOSIS — D509 Iron deficiency anemia, unspecified: Secondary | ICD-10-CM | POA: Diagnosis not present

## 2018-11-26 DIAGNOSIS — N2581 Secondary hyperparathyroidism of renal origin: Secondary | ICD-10-CM | POA: Diagnosis not present

## 2018-11-29 ENCOUNTER — Telehealth: Payer: Self-pay

## 2018-11-29 DIAGNOSIS — N2581 Secondary hyperparathyroidism of renal origin: Secondary | ICD-10-CM | POA: Diagnosis not present

## 2018-11-29 DIAGNOSIS — D509 Iron deficiency anemia, unspecified: Secondary | ICD-10-CM | POA: Diagnosis not present

## 2018-11-29 DIAGNOSIS — J158 Pneumonia due to other specified bacteria: Secondary | ICD-10-CM | POA: Diagnosis not present

## 2018-11-29 DIAGNOSIS — N186 End stage renal disease: Secondary | ICD-10-CM | POA: Diagnosis not present

## 2018-11-29 DIAGNOSIS — Z992 Dependence on renal dialysis: Secondary | ICD-10-CM | POA: Diagnosis not present

## 2018-11-29 DIAGNOSIS — D631 Anemia in chronic kidney disease: Secondary | ICD-10-CM | POA: Diagnosis not present

## 2018-11-29 NOTE — Telephone Encounter (Signed)
Covid-19 screening questions   Do you now or have you had a fever in the last 14 days?  Do you have any respiratory symptoms of shortness of breath or cough now or in the last 14 days?  Do you have any family members or close contacts with diagnosed or suspected Covid-19 in the past 14 days?  Have you been tested for Covid-19 and found to be positive?       

## 2018-11-29 NOTE — Telephone Encounter (Signed)
Patient called back and answered 'NO" to screening questions.

## 2018-11-30 ENCOUNTER — Other Ambulatory Visit: Payer: Self-pay | Admitting: Gastroenterology

## 2018-11-30 ENCOUNTER — Ambulatory Visit (AMBULATORY_SURGERY_CENTER): Payer: Medicare Other | Admitting: Gastroenterology

## 2018-11-30 ENCOUNTER — Encounter: Payer: Self-pay | Admitting: Gastroenterology

## 2018-11-30 ENCOUNTER — Other Ambulatory Visit: Payer: Self-pay

## 2018-11-30 VITALS — BP 170/104 | HR 57 | Temp 98.4°F | Resp 21 | Ht 65.0 in | Wt 191.0 lb

## 2018-11-30 DIAGNOSIS — D123 Benign neoplasm of transverse colon: Secondary | ICD-10-CM | POA: Diagnosis not present

## 2018-11-30 DIAGNOSIS — D124 Benign neoplasm of descending colon: Secondary | ICD-10-CM | POA: Diagnosis not present

## 2018-11-30 DIAGNOSIS — K625 Hemorrhage of anus and rectum: Secondary | ICD-10-CM

## 2018-11-30 DIAGNOSIS — K5730 Diverticulosis of large intestine without perforation or abscess without bleeding: Secondary | ICD-10-CM

## 2018-11-30 DIAGNOSIS — D125 Benign neoplasm of sigmoid colon: Secondary | ICD-10-CM

## 2018-11-30 DIAGNOSIS — D122 Benign neoplasm of ascending colon: Secondary | ICD-10-CM | POA: Diagnosis not present

## 2018-11-30 DIAGNOSIS — K648 Other hemorrhoids: Secondary | ICD-10-CM | POA: Diagnosis not present

## 2018-11-30 HISTORY — PX: COLONOSCOPY: SHX174

## 2018-11-30 MED ORDER — SODIUM CHLORIDE 0.9 % IV SOLN: 500.0000 mL | Freq: Once | INTRAVENOUS | Status: DC

## 2018-11-30 NOTE — Progress Notes (Signed)
Called to room to assist during endoscopic procedure.  Patient ID and intended procedure confirmed with present staff. Received instructions for my participation in the procedure from the performing physician.  

## 2018-11-30 NOTE — Progress Notes (Signed)
VS- Ada  Pt ate "few cheetos yesterday and a few [place] fries at 1900 last night.  Otherwise clear liquids only."

## 2018-11-30 NOTE — Op Note (Signed)
[place] Patient Name: James Wall Procedure Date: 11/30/2018 1:14 PM MRN: [id number] Endoscopist: Thornton Park MD, MD Age: 53 Referring MD:  Date of Birth: 1965/07/15 Gender: Male Account #: [id number] Procedure:                Colonoscopy Indications:              Rectal bleeding - new since his last visit                           Microcytic anemia with a hemoglobin of 8.6                           History of colon polyps on colonoscopy 2013                           - tubular adenoma 2013                           - surveillance recommended 2018 Medicines:                See the Anesthesia note for documentation of the                            administered medications Procedure:                Pre-Anesthesia Assessment:                           - Prior to the procedure, a History and Physical                            was performed, and patient medications and                            allergies were reviewed. The patient's tolerance of                            previous anesthesia was also reviewed. The risks                            and benefits of the procedure and the sedation                            options and risks were discussed with the patient.                            All questions were answered, and informed consent                            was obtained. Prior Anticoagulants: The patient has                            taken no previous anticoagulant or antiplatelet  agents. ASA Grade Assessment: III - A patient with                            severe systemic disease. After reviewing the risks                            and benefits, the patient was deemed in                            satisfactory condition to undergo the procedure.                           After obtaining informed consent, the colonoscope                            was passed under direct vision. Throughout the   procedure, the patient's blood pressure, pulse, and                            oxygen saturations were monitored continuously. The                            Colonoscope was introduced through the anus and                            advanced to the the terminal ileum, with                            identification of the appendiceal orifice and IC                            valve. A second forward view of the right colon was                            performed. The colonoscopy was performed without                            difficulty. The patient tolerated the procedure                            well. The quality of the bowel preparation was                            good. The terminal ileum, ileocecal valve,                            appendiceal orifice, and rectum were photographed. Scope In: 1:32:21 PM Scope Out: 1:52:41 PM Scope Withdrawal Time: 0 hours 16 minutes 30 seconds  Total Procedure Duration: 0 hours 20 minutes 20 seconds  Findings:                 Hemorrhoids were found on perianal exam and on  retroflexed views of the rectum.                           A few small-mouthed diverticula were found in the                            sigmoid colon and descending colon.                           Two sessile polyps were found in the transverse                            colon and ascending colon. The polyps were less                            than 1 mm in size. The location of these polyps on                            the top of a fold made resection with a snare                            extremely difficult. These polyps were ultimately                            removed with a cold biopsy forceps. Resection and                            retrieval were complete. Estimated blood loss was                            minimal.                           Two sessile polyps were found in the sigmoid colon                            and descending colon.  The polyps were 2 to 3 mm in                            size. These polyps were removed with a cold snare.                            Resection and retrieval were complete. Estimated                            blood loss was minimal.                           The exam was otherwise without abnormality on                            direct and retroflexion views. Complications:            No immediate complications. Estimated blood  loss:                            Minimal. Estimated Blood Loss:     Estimated blood loss was minimal. Impression:               - Hemorrhoids found on perianal exam and                            retroflexed views of the rectum.                           - Diverticulosis in the sigmoid colon and in the                            descending colon.                           - Two less than 1 mm polyps in the transverse colon                            and in the ascending colon, removed with a cold                            biopsy forceps. Resected and retrieved.                           - Two 2 to 3 mm polyps in the sigmoid colon and in                            the descending colon, removed with a cold snare.                            Resected and retrieved.                           - The examination was otherwise normal on direct                            and retroflexion views. Recommendation:           - Patient has a contact number available for                            emergencies. The signs and symptoms of potential                            delayed complications were discussed with the                            patient. Return to normal activities tomorrow.                            Written discharge instructions were provided to the  patient.                           - High fiber diet today. Drink at least 64 ounces                            of water daily. Use a daily stool bulking agent                             such as Metamucil.                           - Continue present medications.                           - Await pathology results.                           - Repeat colonoscopy date to be determined after                            pending pathology results are reviewed for                            surveillance based on pathology results.                           - Follow-up in the office to review tehse results. Thornton Park MD, MD 11/30/2018 2:02:22 PM This report has been signed electronically.

## 2018-11-30 NOTE — Patient Instructions (Signed)
Read all handouts given to you by your recovery room nurse.    YOU HAD AN ENDOSCOPIC PROCEDURE TODAY AT [facility]:   Refer to the procedure report that was given to you for any specific questions about what was found during the examination.  If the procedure report does not answer your questions, please call your gastroenterologist to clarify.  If you requested that your care partner not be given the details of your procedure findings, then the procedure report has been included in a sealed envelope for you to review at your convenience later.  YOU SHOULD EXPECT: Some feelings of bloating in the abdomen. Passage of more gas than usual.  Walking can help get rid of the air that was put into your GI tract during the procedure and reduce the bloating. If you had a lower endoscopy (such as a colonoscopy or flexible sigmoidoscopy) you may notice spotting of blood in your stool or on the toilet paper. If you underwent a bowel prep for your procedure, you may not have a normal bowel movement for a few days.  Please Note:  You might notice some irritation and congestion in your nose or some drainage.  This is from the oxygen used during your procedure.  There is no need for concern and it should clear up in a day or so.  SYMPTOMS TO REPORT IMMEDIATELY:   Following lower endoscopy (colonoscopy or flexible sigmoidoscopy):  Excessive amounts of blood in the stool  Significant tenderness or worsening of abdominal pains  Swelling of the abdomen that is new, acute  Fever of 100F or higher   For urgent or emergent issues, a gastroenterologist can be reached at any hour by calling [phone].   DIET:  We do recommend a small meal at first, but then you may proceed to your regular diet.  Drink plenty of fluids but you should avoid alcoholic beverages for 24 hours. Try to eat a high fiber diet, and drink plenty of water.  ACTIVITY:  You should plan to take it easy for the rest of today  and you should NOT DRIVE or use heavy machinery until tomorrow (because of the sedation medicines used during the test).    FOLLOW UP: Our staff will call the number listed on your records 48-72 hours following your procedure to check on you and address any questions or concerns that you may have regarding the information given to you following your procedure. If we do not reach you, we will leave a message.  We will attempt to reach you two times.  During this call, we will ask if you have developed any symptoms of COVID 19. If you develop any symptoms (ie: fever, flu-like symptoms, shortness of breath, cough etc.) before then, please call [phone].  If you test positive for Covid 19 in the 2 weeks post procedure, please call and report this information to [place].    If any biopsies were taken you will be contacted by phone or by letter within the next 1-3 weeks.  Please call us at [phone] if you have not heard about the biopsies in 3 weeks.    SIGNATURES/CONFIDENTIALITY: You and/or your care partner have signed paperwork which will be entered into your electronic medical record.  These signatures attest to the fact that that the information above on your After Visit Summary has been reviewed and is understood.  Full responsibility of the confidentiality of this discharge information lies with you and/or your care-partner.

## 2018-11-30 NOTE — Progress Notes (Signed)
To PACU, VSS. Report to [email or web address] 

## 2018-12-01 ENCOUNTER — Encounter: Payer: Self-pay | Admitting: *Deleted

## 2018-12-01 DIAGNOSIS — N2581 Secondary hyperparathyroidism of renal origin: Secondary | ICD-10-CM | POA: Diagnosis not present

## 2018-12-01 DIAGNOSIS — N186 End stage renal disease: Secondary | ICD-10-CM | POA: Diagnosis not present

## 2018-12-01 DIAGNOSIS — Z992 Dependence on renal dialysis: Secondary | ICD-10-CM | POA: Diagnosis not present

## 2018-12-01 DIAGNOSIS — D509 Iron deficiency anemia, unspecified: Secondary | ICD-10-CM | POA: Diagnosis not present

## 2018-12-01 DIAGNOSIS — D631 Anemia in chronic kidney disease: Secondary | ICD-10-CM | POA: Diagnosis not present

## 2018-12-01 DIAGNOSIS — J158 Pneumonia due to other specified bacteria: Secondary | ICD-10-CM | POA: Diagnosis not present

## 2018-12-02 ENCOUNTER — Telehealth: Payer: Self-pay

## 2018-12-02 NOTE — Telephone Encounter (Signed)
Called [phone] and left a messaged we tried to reach pt for a follow up call. maw

## 2018-12-03 DIAGNOSIS — J158 Pneumonia due to other specified bacteria: Secondary | ICD-10-CM | POA: Diagnosis not present

## 2018-12-03 DIAGNOSIS — Z992 Dependence on renal dialysis: Secondary | ICD-10-CM | POA: Diagnosis not present

## 2018-12-03 DIAGNOSIS — N186 End stage renal disease: Secondary | ICD-10-CM | POA: Diagnosis not present

## 2018-12-03 DIAGNOSIS — D631 Anemia in chronic kidney disease: Secondary | ICD-10-CM | POA: Diagnosis not present

## 2018-12-03 DIAGNOSIS — D509 Iron deficiency anemia, unspecified: Secondary | ICD-10-CM | POA: Diagnosis not present

## 2018-12-03 DIAGNOSIS — N2581 Secondary hyperparathyroidism of renal origin: Secondary | ICD-10-CM | POA: Diagnosis not present

## 2018-12-05 ENCOUNTER — Encounter: Payer: Self-pay | Admitting: Gastroenterology

## 2018-12-05 DIAGNOSIS — Z992 Dependence on renal dialysis: Secondary | ICD-10-CM | POA: Diagnosis not present

## 2018-12-05 DIAGNOSIS — N186 End stage renal disease: Secondary | ICD-10-CM | POA: Diagnosis not present

## 2018-12-05 DIAGNOSIS — I12 Hypertensive chronic kidney disease with 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: Secondary | ICD-10-CM | POA: Diagnosis not present

## 2018-12-06 DIAGNOSIS — D689 Coagulation defect, unspecified: Secondary | ICD-10-CM | POA: Diagnosis not present

## 2018-12-06 DIAGNOSIS — D631 Anemia in chronic kidney disease: Secondary | ICD-10-CM | POA: Diagnosis not present

## 2018-12-06 DIAGNOSIS — N2581 Secondary hyperparathyroidism of renal origin: Secondary | ICD-10-CM | POA: Diagnosis not present

## 2018-12-06 DIAGNOSIS — Z992 Dependence on renal dialysis: Secondary | ICD-10-CM | POA: Diagnosis not present

## 2018-12-06 DIAGNOSIS — J158 Pneumonia due to other specified bacteria: Secondary | ICD-10-CM | POA: Diagnosis not present

## 2018-12-06 DIAGNOSIS — N186 End stage renal disease: Secondary | ICD-10-CM | POA: Diagnosis not present

## 2018-12-06 DIAGNOSIS — D509 Iron deficiency anemia, unspecified: Secondary | ICD-10-CM | POA: Diagnosis not present

## 2018-12-07 ENCOUNTER — Ambulatory Visit (HOSPITAL_COMMUNITY)
Admission: RE | Admit: 2018-12-07 | Discharge: 2018-12-07 | Disposition: A | Discharge status: Home / Self Care | Payer: Medicare Other | Source: Ambulatory Visit | Attending: Gastroenterology | Admitting: Gastroenterology

## 2018-12-07 ENCOUNTER — Encounter (HOSPITAL_COMMUNITY)
Admission: RE | Admit: 2018-12-07 | Discharge: 2018-12-07 | Disposition: A | Discharge status: Home / Self Care | Payer: Medicare Other | Source: Ambulatory Visit | Attending: Gastroenterology | Admitting: Gastroenterology

## 2018-12-07 ENCOUNTER — Other Ambulatory Visit: Payer: Self-pay

## 2018-12-07 DIAGNOSIS — R112 Nausea with vomiting, unspecified: Secondary | ICD-10-CM

## 2018-12-07 DIAGNOSIS — N281 Cyst of kidney, acquired: Secondary | ICD-10-CM | POA: Diagnosis not present

## 2018-12-07 MED ORDER — TECHNETIUM TC 99M MEBROFENIN IV KIT
5.0000 | PACK | Freq: Once | INTRAVENOUS | Status: AC | PRN
  Administered 2018-12-07: 5 via INTRAVENOUS

## 2018-12-08 DIAGNOSIS — J158 Pneumonia due to other specified bacteria: Secondary | ICD-10-CM | POA: Diagnosis not present

## 2018-12-08 DIAGNOSIS — D509 Iron deficiency anemia, unspecified: Secondary | ICD-10-CM | POA: Diagnosis not present

## 2018-12-08 DIAGNOSIS — D689 Coagulation defect, unspecified: Secondary | ICD-10-CM | POA: Diagnosis not present

## 2018-12-08 DIAGNOSIS — N186 End stage renal disease: Secondary | ICD-10-CM | POA: Diagnosis not present

## 2018-12-08 DIAGNOSIS — Z992 Dependence on renal dialysis: Secondary | ICD-10-CM | POA: Diagnosis not present

## 2018-12-08 DIAGNOSIS — N2581 Secondary hyperparathyroidism of renal origin: Secondary | ICD-10-CM | POA: Diagnosis not present

## 2018-12-10 DIAGNOSIS — D509 Iron deficiency anemia, unspecified: Secondary | ICD-10-CM | POA: Diagnosis not present

## 2018-12-10 DIAGNOSIS — J158 Pneumonia due to other specified bacteria: Secondary | ICD-10-CM | POA: Diagnosis not present

## 2018-12-10 DIAGNOSIS — N186 End stage renal disease: Secondary | ICD-10-CM | POA: Diagnosis not present

## 2018-12-10 DIAGNOSIS — Z992 Dependence on renal dialysis: Secondary | ICD-10-CM | POA: Diagnosis not present

## 2018-12-10 DIAGNOSIS — N2581 Secondary hyperparathyroidism of renal origin: Secondary | ICD-10-CM | POA: Diagnosis not present

## 2018-12-10 DIAGNOSIS — D689 Coagulation defect, unspecified: Secondary | ICD-10-CM | POA: Diagnosis not present

## 2018-12-13 ENCOUNTER — Encounter: Payer: Self-pay | Admitting: Family Medicine

## 2018-12-13 DIAGNOSIS — D126 Benign neoplasm of colon, unspecified: Secondary | ICD-10-CM | POA: Insufficient documentation

## 2018-12-13 DIAGNOSIS — D689 Coagulation defect, unspecified: Secondary | ICD-10-CM | POA: Diagnosis not present

## 2018-12-13 DIAGNOSIS — N186 End stage renal disease: Secondary | ICD-10-CM | POA: Diagnosis not present

## 2018-12-13 DIAGNOSIS — J158 Pneumonia due to other specified bacteria: Secondary | ICD-10-CM | POA: Diagnosis not present

## 2018-12-13 DIAGNOSIS — N2581 Secondary hyperparathyroidism of renal origin: Secondary | ICD-10-CM | POA: Diagnosis not present

## 2018-12-13 DIAGNOSIS — Z992 Dependence on renal dialysis: Secondary | ICD-10-CM | POA: Diagnosis not present

## 2018-12-13 DIAGNOSIS — D509 Iron deficiency anemia, unspecified: Secondary | ICD-10-CM | POA: Diagnosis not present

## 2018-12-15 DIAGNOSIS — D689 Coagulation defect, unspecified: Secondary | ICD-10-CM | POA: Diagnosis not present

## 2018-12-15 DIAGNOSIS — N2581 Secondary hyperparathyroidism of renal origin: Secondary | ICD-10-CM | POA: Diagnosis not present

## 2018-12-15 DIAGNOSIS — Z992 Dependence on renal dialysis: Secondary | ICD-10-CM | POA: Diagnosis not present

## 2018-12-15 DIAGNOSIS — D509 Iron deficiency anemia, unspecified: Secondary | ICD-10-CM | POA: Diagnosis not present

## 2018-12-15 DIAGNOSIS — J158 Pneumonia due to other specified bacteria: Secondary | ICD-10-CM | POA: Diagnosis not present

## 2018-12-15 DIAGNOSIS — N186 End stage renal disease: Secondary | ICD-10-CM | POA: Diagnosis not present

## 2018-12-17 DIAGNOSIS — D689 Coagulation defect, unspecified: Secondary | ICD-10-CM | POA: Diagnosis not present

## 2018-12-17 DIAGNOSIS — Z992 Dependence on renal dialysis: Secondary | ICD-10-CM | POA: Diagnosis not present

## 2018-12-17 DIAGNOSIS — J158 Pneumonia due to other specified bacteria: Secondary | ICD-10-CM | POA: Diagnosis not present

## 2018-12-17 DIAGNOSIS — D509 Iron deficiency anemia, unspecified: Secondary | ICD-10-CM | POA: Diagnosis not present

## 2018-12-17 DIAGNOSIS — N186 End stage renal disease: Secondary | ICD-10-CM | POA: Diagnosis not present

## 2018-12-17 DIAGNOSIS — N2581 Secondary hyperparathyroidism of renal origin: Secondary | ICD-10-CM | POA: Diagnosis not present

## 2018-12-20 DIAGNOSIS — J158 Pneumonia due to other specified bacteria: Secondary | ICD-10-CM | POA: Diagnosis not present

## 2018-12-20 DIAGNOSIS — N2581 Secondary hyperparathyroidism of renal origin: Secondary | ICD-10-CM | POA: Diagnosis not present

## 2018-12-20 DIAGNOSIS — Z992 Dependence on renal dialysis: Secondary | ICD-10-CM | POA: Diagnosis not present

## 2018-12-20 DIAGNOSIS — D509 Iron deficiency anemia, unspecified: Secondary | ICD-10-CM | POA: Diagnosis not present

## 2018-12-20 DIAGNOSIS — N186 End stage renal disease: Secondary | ICD-10-CM | POA: Diagnosis not present

## 2018-12-20 DIAGNOSIS — D689 Coagulation defect, unspecified: Secondary | ICD-10-CM | POA: Diagnosis not present

## 2018-12-22 DIAGNOSIS — J158 Pneumonia due to other specified bacteria: Secondary | ICD-10-CM | POA: Diagnosis not present

## 2018-12-22 DIAGNOSIS — D509 Iron deficiency anemia, unspecified: Secondary | ICD-10-CM | POA: Diagnosis not present

## 2018-12-22 DIAGNOSIS — D689 Coagulation defect, unspecified: Secondary | ICD-10-CM | POA: Diagnosis not present

## 2018-12-22 DIAGNOSIS — N2581 Secondary hyperparathyroidism of renal origin: Secondary | ICD-10-CM | POA: Diagnosis not present

## 2018-12-22 DIAGNOSIS — N186 End stage renal disease: Secondary | ICD-10-CM | POA: Diagnosis not present

## 2018-12-22 DIAGNOSIS — Z992 Dependence on renal dialysis: Secondary | ICD-10-CM | POA: Diagnosis not present

## 2018-12-24 DIAGNOSIS — D689 Coagulation defect, unspecified: Secondary | ICD-10-CM | POA: Diagnosis not present

## 2018-12-24 DIAGNOSIS — Z992 Dependence on renal dialysis: Secondary | ICD-10-CM | POA: Diagnosis not present

## 2018-12-24 DIAGNOSIS — N2581 Secondary hyperparathyroidism of renal origin: Secondary | ICD-10-CM | POA: Diagnosis not present

## 2018-12-24 DIAGNOSIS — D509 Iron deficiency anemia, unspecified: Secondary | ICD-10-CM | POA: Diagnosis not present

## 2018-12-24 DIAGNOSIS — N186 End stage renal disease: Secondary | ICD-10-CM | POA: Diagnosis not present

## 2018-12-24 DIAGNOSIS — J158 Pneumonia due to other specified bacteria: Secondary | ICD-10-CM | POA: Diagnosis not present

## 2018-12-26 DIAGNOSIS — N186 End stage renal disease: Secondary | ICD-10-CM | POA: Diagnosis not present

## 2018-12-26 DIAGNOSIS — Z992 Dependence on renal dialysis: Secondary | ICD-10-CM | POA: Diagnosis not present

## 2018-12-26 DIAGNOSIS — J158 Pneumonia due to other specified bacteria: Secondary | ICD-10-CM | POA: Diagnosis not present

## 2018-12-26 DIAGNOSIS — N2581 Secondary hyperparathyroidism of renal origin: Secondary | ICD-10-CM | POA: Diagnosis not present

## 2018-12-26 DIAGNOSIS — D689 Coagulation defect, unspecified: Secondary | ICD-10-CM | POA: Diagnosis not present

## 2018-12-26 DIAGNOSIS — D509 Iron deficiency anemia, unspecified: Secondary | ICD-10-CM | POA: Diagnosis not present

## 2018-12-28 DIAGNOSIS — Z992 Dependence on renal dialysis: Secondary | ICD-10-CM | POA: Diagnosis not present

## 2018-12-28 DIAGNOSIS — D509 Iron deficiency anemia, unspecified: Secondary | ICD-10-CM | POA: Diagnosis not present

## 2018-12-28 DIAGNOSIS — N2581 Secondary hyperparathyroidism of renal origin: Secondary | ICD-10-CM | POA: Diagnosis not present

## 2018-12-28 DIAGNOSIS — J158 Pneumonia due to other specified bacteria: Secondary | ICD-10-CM | POA: Diagnosis not present

## 2018-12-28 DIAGNOSIS — N186 End stage renal disease: Secondary | ICD-10-CM | POA: Diagnosis not present

## 2018-12-28 DIAGNOSIS — D689 Coagulation defect, unspecified: Secondary | ICD-10-CM | POA: Diagnosis not present

## 2018-12-31 DIAGNOSIS — N186 End stage renal disease: Secondary | ICD-10-CM | POA: Diagnosis not present

## 2018-12-31 DIAGNOSIS — D689 Coagulation defect, unspecified: Secondary | ICD-10-CM | POA: Diagnosis not present

## 2018-12-31 DIAGNOSIS — N2581 Secondary hyperparathyroidism of renal origin: Secondary | ICD-10-CM | POA: Diagnosis not present

## 2018-12-31 DIAGNOSIS — J158 Pneumonia due to other specified bacteria: Secondary | ICD-10-CM | POA: Diagnosis not present

## 2018-12-31 DIAGNOSIS — D509 Iron deficiency anemia, unspecified: Secondary | ICD-10-CM | POA: Diagnosis not present

## 2018-12-31 DIAGNOSIS — Z992 Dependence on renal dialysis: Secondary | ICD-10-CM | POA: Diagnosis not present

## 2019-01-03 DIAGNOSIS — D509 Iron deficiency anemia, unspecified: Secondary | ICD-10-CM | POA: Diagnosis not present

## 2019-01-03 DIAGNOSIS — D689 Coagulation defect, unspecified: Secondary | ICD-10-CM | POA: Diagnosis not present

## 2019-01-03 DIAGNOSIS — N186 End stage renal disease: Secondary | ICD-10-CM | POA: Diagnosis not present

## 2019-01-03 DIAGNOSIS — N2581 Secondary hyperparathyroidism of renal origin: Secondary | ICD-10-CM | POA: Diagnosis not present

## 2019-01-03 DIAGNOSIS — J158 Pneumonia due to other specified bacteria: Secondary | ICD-10-CM | POA: Diagnosis not present

## 2019-01-03 DIAGNOSIS — Z992 Dependence on renal dialysis: Secondary | ICD-10-CM | POA: Diagnosis not present

## 2019-01-11 ENCOUNTER — Ambulatory Visit: Payer: Medicare Other | Admitting: Gastroenterology

## 2019-02-04 DIAGNOSIS — Z992 Dependence on renal dialysis: Secondary | ICD-10-CM | POA: Diagnosis not present

## 2019-02-04 DIAGNOSIS — N186 End stage renal disease: Secondary | ICD-10-CM | POA: Diagnosis not present

## 2019-02-04 DIAGNOSIS — I12 Hypertensive chronic kidney disease with 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: Secondary | ICD-10-CM | POA: Diagnosis not present

## 2019-02-05 DIAGNOSIS — J158 Pneumonia due to other specified bacteria: Secondary | ICD-10-CM | POA: Diagnosis not present

## 2019-02-05 DIAGNOSIS — Z992 Dependence on renal dialysis: Secondary | ICD-10-CM | POA: Diagnosis not present

## 2019-02-05 DIAGNOSIS — N2581 Secondary hyperparathyroidism of renal origin: Secondary | ICD-10-CM | POA: Diagnosis not present

## 2019-02-05 DIAGNOSIS — N186 End stage renal disease: Secondary | ICD-10-CM | POA: Diagnosis not present

## 2019-02-05 DIAGNOSIS — D631 Anemia in chronic kidney disease: Secondary | ICD-10-CM | POA: Diagnosis not present

## 2019-02-05 DIAGNOSIS — D509 Iron deficiency anemia, unspecified: Secondary | ICD-10-CM | POA: Diagnosis not present

## 2019-02-07 DIAGNOSIS — Z992 Dependence on renal dialysis: Secondary | ICD-10-CM | POA: Diagnosis not present

## 2019-02-07 DIAGNOSIS — N186 End stage renal disease: Secondary | ICD-10-CM | POA: Diagnosis not present

## 2019-02-07 DIAGNOSIS — N2581 Secondary hyperparathyroidism of renal origin: Secondary | ICD-10-CM | POA: Diagnosis not present

## 2019-02-07 DIAGNOSIS — J158 Pneumonia due to other specified bacteria: Secondary | ICD-10-CM | POA: Diagnosis not present

## 2019-02-07 DIAGNOSIS — D509 Iron deficiency anemia, unspecified: Secondary | ICD-10-CM | POA: Diagnosis not present

## 2019-02-07 DIAGNOSIS — D631 Anemia in chronic kidney disease: Secondary | ICD-10-CM | POA: Diagnosis not present

## 2019-02-08 ENCOUNTER — Ambulatory Visit: Payer: Medicare Other | Admitting: Gastroenterology

## 2019-02-09 DIAGNOSIS — N2581 Secondary hyperparathyroidism of renal origin: Secondary | ICD-10-CM | POA: Diagnosis not present

## 2019-02-09 DIAGNOSIS — D631 Anemia in chronic kidney disease: Secondary | ICD-10-CM | POA: Diagnosis not present

## 2019-02-09 DIAGNOSIS — J158 Pneumonia due to other specified bacteria: Secondary | ICD-10-CM | POA: Diagnosis not present

## 2019-02-09 DIAGNOSIS — Z992 Dependence on renal dialysis: Secondary | ICD-10-CM | POA: Diagnosis not present

## 2019-02-09 DIAGNOSIS — D509 Iron deficiency anemia, unspecified: Secondary | ICD-10-CM | POA: Diagnosis not present

## 2019-02-09 DIAGNOSIS — N186 End stage renal disease: Secondary | ICD-10-CM | POA: Diagnosis not present

## 2019-02-11 DIAGNOSIS — D509 Iron deficiency anemia, unspecified: Secondary | ICD-10-CM | POA: Diagnosis not present

## 2019-02-11 DIAGNOSIS — N2581 Secondary hyperparathyroidism of renal origin: Secondary | ICD-10-CM | POA: Diagnosis not present

## 2019-02-11 DIAGNOSIS — D631 Anemia in chronic kidney disease: Secondary | ICD-10-CM | POA: Diagnosis not present

## 2019-02-11 DIAGNOSIS — Z992 Dependence on renal dialysis: Secondary | ICD-10-CM | POA: Diagnosis not present

## 2019-02-11 DIAGNOSIS — J158 Pneumonia due to other specified bacteria: Secondary | ICD-10-CM | POA: Diagnosis not present

## 2019-02-11 DIAGNOSIS — N186 End stage renal disease: Secondary | ICD-10-CM | POA: Diagnosis not present

## 2019-02-14 DIAGNOSIS — N2581 Secondary hyperparathyroidism of renal origin: Secondary | ICD-10-CM | POA: Diagnosis not present

## 2019-02-14 DIAGNOSIS — Z992 Dependence on renal dialysis: Secondary | ICD-10-CM | POA: Diagnosis not present

## 2019-02-14 DIAGNOSIS — N186 End stage renal disease: Secondary | ICD-10-CM | POA: Diagnosis not present

## 2019-02-14 DIAGNOSIS — D631 Anemia in chronic kidney disease: Secondary | ICD-10-CM | POA: Diagnosis not present

## 2019-02-14 DIAGNOSIS — D509 Iron deficiency anemia, unspecified: Secondary | ICD-10-CM | POA: Diagnosis not present

## 2019-02-14 DIAGNOSIS — J158 Pneumonia due to other specified bacteria: Secondary | ICD-10-CM | POA: Diagnosis not present

## 2019-02-16 DIAGNOSIS — Z992 Dependence on renal dialysis: Secondary | ICD-10-CM | POA: Diagnosis not present

## 2019-02-16 DIAGNOSIS — J158 Pneumonia due to other specified bacteria: Secondary | ICD-10-CM | POA: Diagnosis not present

## 2019-02-16 DIAGNOSIS — N186 End stage renal disease: Secondary | ICD-10-CM | POA: Diagnosis not present

## 2019-02-16 DIAGNOSIS — D509 Iron deficiency anemia, unspecified: Secondary | ICD-10-CM | POA: Diagnosis not present

## 2019-02-16 DIAGNOSIS — N2581 Secondary hyperparathyroidism of renal origin: Secondary | ICD-10-CM | POA: Diagnosis not present

## 2019-02-16 DIAGNOSIS — D631 Anemia in chronic kidney disease: Secondary | ICD-10-CM | POA: Diagnosis not present

## 2019-02-18 DIAGNOSIS — N186 End stage renal disease: Secondary | ICD-10-CM | POA: Diagnosis not present

## 2019-02-18 DIAGNOSIS — N2581 Secondary hyperparathyroidism of renal origin: Secondary | ICD-10-CM | POA: Diagnosis not present

## 2019-02-18 DIAGNOSIS — J158 Pneumonia due to other specified bacteria: Secondary | ICD-10-CM | POA: Diagnosis not present

## 2019-02-18 DIAGNOSIS — D631 Anemia in chronic kidney disease: Secondary | ICD-10-CM | POA: Diagnosis not present

## 2019-02-18 DIAGNOSIS — D509 Iron deficiency anemia, unspecified: Secondary | ICD-10-CM | POA: Diagnosis not present

## 2019-02-18 DIAGNOSIS — Z992 Dependence on renal dialysis: Secondary | ICD-10-CM | POA: Diagnosis not present

## 2019-02-21 DIAGNOSIS — D631 Anemia in chronic kidney disease: Secondary | ICD-10-CM | POA: Diagnosis not present

## 2019-02-21 DIAGNOSIS — D509 Iron deficiency anemia, unspecified: Secondary | ICD-10-CM | POA: Diagnosis not present

## 2019-02-21 DIAGNOSIS — N2581 Secondary hyperparathyroidism of renal origin: Secondary | ICD-10-CM | POA: Diagnosis not present

## 2019-02-21 DIAGNOSIS — J158 Pneumonia due to other specified bacteria: Secondary | ICD-10-CM | POA: Diagnosis not present

## 2019-02-21 DIAGNOSIS — Z992 Dependence on renal dialysis: Secondary | ICD-10-CM | POA: Diagnosis not present

## 2019-02-21 DIAGNOSIS — N186 End stage renal disease: Secondary | ICD-10-CM | POA: Diagnosis not present

## 2019-02-23 DIAGNOSIS — N2581 Secondary hyperparathyroidism of renal origin: Secondary | ICD-10-CM | POA: Diagnosis not present

## 2019-02-23 DIAGNOSIS — Z992 Dependence on renal dialysis: Secondary | ICD-10-CM | POA: Diagnosis not present

## 2019-02-23 DIAGNOSIS — D509 Iron deficiency anemia, unspecified: Secondary | ICD-10-CM | POA: Diagnosis not present

## 2019-02-23 DIAGNOSIS — J158 Pneumonia due to other specified bacteria: Secondary | ICD-10-CM | POA: Diagnosis not present

## 2019-02-23 DIAGNOSIS — N186 End stage renal disease: Secondary | ICD-10-CM | POA: Diagnosis not present

## 2019-02-23 DIAGNOSIS — D631 Anemia in chronic kidney disease: Secondary | ICD-10-CM | POA: Diagnosis not present

## 2019-02-25 DIAGNOSIS — Z992 Dependence on renal dialysis: Secondary | ICD-10-CM | POA: Diagnosis not present

## 2019-02-25 DIAGNOSIS — D631 Anemia in chronic kidney disease: Secondary | ICD-10-CM | POA: Diagnosis not present

## 2019-02-25 DIAGNOSIS — N2581 Secondary hyperparathyroidism of renal origin: Secondary | ICD-10-CM | POA: Diagnosis not present

## 2019-02-25 DIAGNOSIS — J158 Pneumonia due to other specified bacteria: Secondary | ICD-10-CM | POA: Diagnosis not present

## 2019-02-25 DIAGNOSIS — N186 End stage renal disease: Secondary | ICD-10-CM | POA: Diagnosis not present

## 2019-02-25 DIAGNOSIS — D509 Iron deficiency anemia, unspecified: Secondary | ICD-10-CM | POA: Diagnosis not present

## 2019-02-28 DIAGNOSIS — D509 Iron deficiency anemia, unspecified: Secondary | ICD-10-CM | POA: Diagnosis not present

## 2019-02-28 DIAGNOSIS — Z992 Dependence on renal dialysis: Secondary | ICD-10-CM | POA: Diagnosis not present

## 2019-02-28 DIAGNOSIS — D631 Anemia in chronic kidney disease: Secondary | ICD-10-CM | POA: Diagnosis not present

## 2019-02-28 DIAGNOSIS — N2581 Secondary hyperparathyroidism of renal origin: Secondary | ICD-10-CM | POA: Diagnosis not present

## 2019-02-28 DIAGNOSIS — N186 End stage renal disease: Secondary | ICD-10-CM | POA: Diagnosis not present

## 2019-02-28 DIAGNOSIS — J158 Pneumonia due to other specified bacteria: Secondary | ICD-10-CM | POA: Diagnosis not present

## 2019-03-02 ENCOUNTER — Telehealth: Payer: Self-pay | Admitting: Cardiology

## 2019-03-02 DIAGNOSIS — J158 Pneumonia due to other specified bacteria: Secondary | ICD-10-CM | POA: Diagnosis not present

## 2019-03-02 DIAGNOSIS — D631 Anemia in chronic kidney disease: Secondary | ICD-10-CM | POA: Diagnosis not present

## 2019-03-02 DIAGNOSIS — Z992 Dependence on renal dialysis: Secondary | ICD-10-CM | POA: Diagnosis not present

## 2019-03-02 DIAGNOSIS — N186 End stage renal disease: Secondary | ICD-10-CM | POA: Diagnosis not present

## 2019-03-02 DIAGNOSIS — D509 Iron deficiency anemia, unspecified: Secondary | ICD-10-CM | POA: Diagnosis not present

## 2019-03-02 DIAGNOSIS — N2581 Secondary hyperparathyroidism of renal origin: Secondary | ICD-10-CM | POA: Diagnosis not present

## 2019-03-02 NOTE — Telephone Encounter (Signed)
Patient says he has not been able to keep food down and he is sweating profusely just out of the top of his head. He does not have his BP readings, but when he has been at dialysis his BP is high.He says the staff at the Dialysis center do not seem to be concerned with his elevated BP.  He takes his BP meds after dialysis because otherwise his BP goes too low.   He does not know what else he can do for himself. He does not want to go to the hospital, but he is at his whits end

## 2019-03-02 NOTE — Telephone Encounter (Signed)
Pt is calling in to make an appt with Dr. Meda Coffee or an APP for sometime this week, for high BP readings, especially on dialysis days. Pt does not have a record of his readings on hand, but can request them from the dialysis center. Pt states he is also dealing with a lot of GI issues, and has an appt with GI on 03/10/19 for follow-up of colonoscopy, abdominal US, and HIDA Scan.  Pt states with his GI issues, he is having difficulty keeping food down, right after he eats.  Pt states he then vomits and he gets real sweaty "on the top of his head." Scheduled the pt to come in and see Cecilie Kicks NP for tomorrow 1/28 at 1000.  Advised the pt to arrive 15  mins prior to that appt and wear his facial mask. Advised the pt if possible, obtain his BP readings from his dialysis center and bring to this visit, so that Mickel Baas can review.  Informed the pt that I will make Dr. Meda Coffee aware of this plan by routing this message. Pt verbalized understanding and agrees with this plan.

## 2019-03-03 ENCOUNTER — Other Ambulatory Visit: Payer: Self-pay

## 2019-03-03 ENCOUNTER — Telehealth: Payer: Self-pay | Admitting: Radiology

## 2019-03-03 ENCOUNTER — Telehealth: Payer: Self-pay | Admitting: Licensed Clinical Social Worker

## 2019-03-03 ENCOUNTER — Encounter: Payer: Self-pay | Admitting: Cardiology

## 2019-03-03 ENCOUNTER — Ambulatory Visit (INDEPENDENT_AMBULATORY_CARE_PROVIDER_SITE_OTHER): Payer: Medicare Other | Admitting: Cardiology

## 2019-03-03 VITALS — BP 188/120 | HR 79 | Ht 65.0 in | Wt 199.0 lb

## 2019-03-03 DIAGNOSIS — F329 Major depressive disorder, single episode, unspecified: Secondary | ICD-10-CM

## 2019-03-03 DIAGNOSIS — I10 Essential (primary) hypertension: Secondary | ICD-10-CM | POA: Diagnosis not present

## 2019-03-03 DIAGNOSIS — R002 Palpitations: Secondary | ICD-10-CM | POA: Diagnosis not present

## 2019-03-03 DIAGNOSIS — I272 Pulmonary hypertension, unspecified: Secondary | ICD-10-CM

## 2019-03-03 DIAGNOSIS — I5042 Chronic combined systolic (congestive) and diastolic (congestive) heart failure: Secondary | ICD-10-CM | POA: Diagnosis not present

## 2019-03-03 DIAGNOSIS — N186 End stage renal disease: Secondary | ICD-10-CM | POA: Diagnosis not present

## 2019-03-03 DIAGNOSIS — F32A Depression, unspecified: Secondary | ICD-10-CM

## 2019-03-03 LAB — CBC
Hematocrit: 30.9 % — ABNORMAL LOW (ref 37.5–51.0)
Hemoglobin: 10.2 g/dL — ABNORMAL LOW (ref 13.0–17.7)
MCH: 28.2 pg (ref 26.6–33.0)
MCHC: 33 g/dL (ref 31.5–35.7)
MCV: 85 fL (ref 79–97)
Platelets: 188 10*3/uL (ref 150–450)
RBC: 3.62 x10E6/uL — ABNORMAL LOW (ref 4.14–5.80)
RDW: 16.9 % — ABNORMAL HIGH (ref 11.6–15.4)
WBC: 8.7 10*3/uL (ref 3.4–10.8)

## 2019-03-03 LAB — TSH: TSH: 2.9 u[IU]/mL (ref 0.450–4.500)

## 2019-03-03 MED ORDER — METOPROLOL TARTRATE 100 MG PO TABS: ORAL_TABLET | ORAL | 3 refills | Status: DC

## 2019-03-03 MED ORDER — HYDRALAZINE HCL 25 MG PO TABS: ORAL_TABLET | ORAL | 3 refills | Status: DC

## 2019-03-03 NOTE — Progress Notes (Signed)
Cardiology Office Note   Date:  03/03/2019   ID:  Demtrius, Rounds 14-Oct-1965, MRN [id number]  PCP:  Sherene Sires, DO  Cardiologist:  Dr. Meda Coffee     Chief Complaint  Patient presents with  . Hypertension      History of Present Illness: James Wall is a 54 y.o. male who presents for HTN   history of hypertension that subsequently led to end-stage renal disease on renal hemodialysis for the last 6 years. The patient also has a diagnosis of liver cirrhosis of unknown etiology. The patient is coming with concerns of fatigue, shortness of breath, and weight loss. The patient states that he has lost significant amount of weight in the last 2 years. He is minimally active as he feels as he has no energy. And he gets short of breath with minimal activity. The patient denies any chest pain. He has also been experiencing palpitations associated with shortness of breath at the end of his dialysis.   10/26/2018 - 1 year follow up, he is doing ok, BP remains elevated however previously ad episodes of hypotension at the HD center. He hasn't had LE edema in a year, no orthopnea, PND, no chest pain. He walks few blocks every day without significant DOE.  The patient does not have symptoms concerning for COVID-19 infection (fever, chills, cough, or new shortness of breath).   last visit 10/26/18 he was stable. He was on lopressor 100 bid , hydralazine 25 TID in addition to current meds.  He does state with dialysis his BP drops.      Now with elevated BP and GI issues.   His GI nausea/vomiting is being worked up.  His BP is very elevated was even up to 223/110 a few days ago.  He is no longer taking his BB twice a day or his hydralazine.  I do not know if they were decreased for hypotension with dialysis or if he forgot.   No chest pain, no SOB does continue to smoke  His dialysis in MWF early AM.  He also  Complains of irregular HR and palpitations.  He does feel lightheaded with these.    Denies any bleeding issues.    Past Medical History:  Diagnosis Date  . Anemia   . Anxiety   . Arthritis    GOUT - pt not sure if this is true- not sure 11-30-18  . 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 Blood transfusion without reported diagnosis   . Cirrhosis, nonalcoholic ([place])   . COPD (chronic obstructive pulmonary disease) ([place])   . Depression   . ESRD on hemodialysis [facility])    Started HD Jan 2009.  ESRD was due to HTN.  Dx'd with HTN in hospital 1996 according to pt, they had to keep him so he could get Medicaid to afford the BP medications.  First saw a nephrologist and started HD in the same year 2009.  Gets HD at [facility] on a MWF schedule.  Does not have DM. He had a left RC AVF that never functioned, a left upper arm AVF that worked for about 5 years and as of June  . GERD (gastroesophageal reflux disease)   . Heart murmur    as a child per pt  . Hyperlipidemia   . Hypertension   . Pneumonia 03/2017  . Renal insufficiency   . Secondary hyperparathyroidism ([place])   . Sepsis ([place]) 02/2013   from AVF , treated with Vancomycin.  Marland Kitchen  Shortness of breath    With exertion  . Sleep apnea    no  longer using cpap    Past Surgical History:  Procedure Laterality Date  . AV FISTULA PLACEMENT  2009   Left lower arm AVF  . AV FISTULA PLACEMENT Right 02/22/2013   Procedure:  CREATION  OF BRACHIAL CEPHALIC FISTULA RIGHT ARM;  Surgeon: Elam Dutch, MD;  Location: [place];  Service: Vascular;  Laterality: Right;  . AV FISTULA PLACEMENT Left 08/10/2014   Procedure: BASILIC VEIN TRANSPOSITION  ARTERIOVENOUS (AV) FISTULA CREATION LEFT UPPER ARM;  Surgeon: Mal Misty, MD;  Location: [place];  Service: Vascular;  Laterality: Left;  . COLONOSCOPY    . ESOPHAGOGASTRODUODENOSCOPY (EGD) WITH PROPOFOL N/A 04/12/2013   Procedure: ESOPHAGOGASTRODUODENOSCOPY (EGD) WITH PROPOFOL;  Surgeon: Arta Silence, MD;  Location: [facility];  Service: Endoscopy;  Laterality: N/A;  . INSERTION OF DIALYSIS  CATHETER N/A 12/23/2012   Procedure: INSERTION OF DIALYSIS CATHETER; ULTRASOUND GUIDED;  Surgeon: Angelia Mould, MD;  Location: [place];  Service: Vascular;  Laterality: N/A;  . INSERTION OF DIALYSIS CATHETER  10/22/2015   Right IJ non-tunneled HD catheter, placed again in 2019  . IR GENERIC HISTORICAL  10/22/2015   IR US GUIDE VASC ACCESS RIGHT 10/22/2015 [facility]  . IR GENERIC HISTORICAL  10/22/2015   IR FLUORO GUIDE CV LINE RIGHT 10/22/2015 [facility]  . IR GENERIC HISTORICAL  10/23/2015   IR FLUORO GUIDE CV LINE RIGHT 10/23/2015 Marybelle Killings, MD [facility]  . LEFT HEART CATHETERIZATION WITH CORONARY ANGIOGRAM N/A 07/13/2013   Procedure: LEFT HEART CATHETERIZATION WITH CORONARY ANGIOGRAM;  Surgeon: Jettie Booze, MD;  Location: [facility] CATH LAB;  Service: Cardiovascular;  Laterality: N/A;  . LIGATION OF ARTERIOVENOUS  FISTULA Left 12/22/2012   Procedure: LIGATION OF ARTERIOVENOUS  FISTULA;EXCISION OF LARGE ANEURYSMS;;  Surgeon: Elam Dutch, MD;  Location: [place];  Service: Vascular;  Laterality: Left;  . UPPER GASTROINTESTINAL ENDOSCOPY       Current Outpatient Medications  Medication Sig Dispense Refill  . acetaminophen (TYLENOL) 650 MG CR tablet Take 1 tablet (650 mg total) by mouth daily as needed for pain.    Marland Kitchen amLODipine (NORVASC) 10 MG tablet TAKE 1 TABLET(10 MG) BY MOUTH DAILY 90 tablet 0  . cloNIDine (CATAPRES - DOSED IN MG/24 HR) 0.3 mg/24hr patch 0.3 mg once a week.    . cloNIDine (CATAPRES - DOSED IN MG/24 HR) 0.3 mg/24hr Place 0.3 mg onto the skin every Sunday. Saturday.    . Etelcalcetide HCl (PARSABIV IV) Etelcalcetide (Parsabiv)    . heparin 1000 unit/mL SOLN injection Heparin Sodium (Porcine) 1,000 Units/mL Systemic    . heparin 1000 unit/mL SOLN injection Heparin Sodium (Porcine) 1,000 Units/mL Catheter Lock Venous    . heparin 1000 unit/mL SOLN injection Heparin Sodium (Porcine) 1,000 Units/mL Catheter Lock Arterial    . hydrOXYzine (ATARAX/VISTARIL) 25  MG tablet Take 1 tablet (25 mg total) by mouth every 8 (eight) hours as needed for itching. Further refills need to come from your PCP. 30 tablet 3  . ketoconazole (NIZORAL) 2 % cream APP EXT AA BID    . metoprolol tartrate (LOPRESSOR) 100 MG tablet Take 1 tablet by mouth twice a day.  ON DIALYSIS DAYS, TAKE AFTER DIALYSIS. 180 tablet 3  . pantoprazole (PROTONIX) 40 MG tablet Take 1 tablet (40 mg total) by mouth 2 (two) times daily before a meal. 180 tablet 3  . sevelamer carbonate (RENVELA) 800 MG tablet Take 3,200-4,800  mg by mouth See admin instructions. Take 4-6 tablets (3200-4800 mg) by mouth one to two times daily with meals    . tamsulosin (FLOMAX) 0.4 MG CAPS capsule Take 0.4 mg by mouth daily.  0  . VITAMIN D, CHOLECALCIFEROL, PO Take by mouth.    . hydrALAZINE (APRESOLINE) 25 MG tablet Take 1 tablet by mouth three times a day.  ON DIALYSIS DAYS TAKE AFTER DIALYSIS AND ONLY TAKE IT TWICE A DAY 270 tablet 3   No current facility-administered medications for this visit.    Allergies:   Venofer  [ferric oxide] and Aspirin    Social History:  The patient  reports that he has been smoking cigarettes. He has a 4.00 pack-year smoking history. He has never used smokeless tobacco. He reports current alcohol use. He reports current drug use. Frequency: 2.00 times per week. Drug: Marijuana.   Family History:  The patient's family history includes Asthma in his brother; Cerebrovascular Accident in his father; Congestive Heart Failure in his brother; Hypertension in his father and mother.    ROS:  General:no colds or fevers, no weight changes Skin:no rashes or ulcers HEENT:no blurred vision, no congestion CV:see HPI PUL:see HPI GI:no diarrhea constipation or melena, no indigestion GU:no hematuria, no dysuria MS:no joint pain, no claudication Neuro:no syncope, no lightheadedness Endo:no diabetes, no thyroid disease Psych:  Tearful today, his mother died 4 years ago and he is grieving.  He  did not expect to live 12 years with dialysis   Wt Readings from Last 3 Encounters:  03/03/19 199 lb (90.3 kg)  11/30/18 191 lb (86.6 kg)  10/26/18 191 lb (86.6 kg)     PHYSICAL EXAM: VS:  BP (!) 188/120   Pulse 79   Ht 5' 5"  (1.651 m)   Wt 199 lb (90.3 kg)   SpO2 98%   BMI 33.12 kg/m  , BMI Body mass index is 33.12 kg/m. General:Pleasant affect, NAD Skin:Warm and dry, brisk capillary refill HEENT:normocephalic, sclera clear, mucus membranes moist Neck:supple, no JVD, no bruits  Heart:S1S2 RRR without murmur, gallup, rub or click Lungs:clear without rales, rhonchi, or wheezes EZM:OQHU, non tender, + BS, do not palpate liver spleen or masses Ext:no lower ext edema, 2+ pedal pulses, 2+ radial pulses Neuro:alert and oriented X 3, MAE, follows commands, + facial symmetry    EKG:  EKG is ordered today. The ekg ordered today demonstrates SR at 79 non specific T wave abnormality but no changes from 12/2017.    Recent Labs: No results found for requested labs within last 8760 hours.    Lipid Panel No results found for: CHOL, TRIG, HDL, CHOLHDL, VLDL, LDLCALC, LDLDIRECT     Other studies Reviewed: Additional studies/ records that were reviewed today include: . TTE: 12/2017 - Left ventricle: Septal and inferior basal hypokinesis. The cavity size was mildly dilated. Wall thickness was normal. Systolic function was mildly reduced. The estimated ejection fraction was in the range of 45% to 50%. Doppler parameters are consistent with both elevated ventricular end-diastolic filling pressure and elevated left atrial filling pressure. - Aortic valve: There was trivial regurgitation. - Mitral valve: Moderately calcified annulus. Moderately thickened, moderately calcified leaflets . There was mild to moderate regurgitation. - Left atrium: The atrium was moderately dilated. - Right atrium: The atrium was mildly dilated. - Atrial septum: No defect or patent foramen  ovale was identified. - Tricuspid valve: There was moderate regurgitation. - Pulmonary arteries: PA peak pressure: 97 mm Hg (S). - Impressions: Severe pulmonary hypertension  based on TR velocity.  Impressions: - Severe pulmonary hypertension based on TR velocity.   ASSESSMENT AND PLAN:  1.  HTN uncontrolled, / pulmonary HTN some of his meds have been stopped by pt or provider.  Will resume lopressor 100 mg BID - hold for AM dialysis and take after wards, resume hydralazine 25 mg TID except only BID on dialysis days.  Follow up in 1 week to eval BP  Will check BCB today and TSH.  2 Palpitations and tachycardia will do 2 week zio patch monitor to eval for a fib or other arrhythmias.    This may be due to high BP  3.  Chronic systolic and diastolic HF euvolemic today.   4.  ESRD on HD MWF continue per renal   5.  Depression tearful will check with social worker to see if we can have him see a Social worker.  Spent time listening to pt    Current medicines are reviewed with the patient today.  The patient Has no concerns regarding medicines.  The following changes have been made:  See above Labs/ tests ordered today include:see above  Disposition:   FU:  see above  Signed, Cecilie Kicks, NP  03/03/2019 2:06 PM    [facility] [place], [place], [address] [place], [place] Phone: [phone]; Fax: [phone]

## 2019-03-03 NOTE — Telephone Encounter (Signed)
Enrolled patient for a 14 day Zio monitor to be mailed to patients home.  

## 2019-03-03 NOTE — Telephone Encounter (Signed)
CSW contacted patient to offer supportive counseling per request from staff. Patient shared at length about frustrations at the dialysis center and his own mortality as well as that of the other patients who he shared his 3x weekly visits. Patient appears very emotional and overwhelmed with medical needs. CSW provided supportive listening and will continue to provide support while seeking an appropriate counseling option for him. Patient grateful for the support and time spent with him. Raquel Sarna, [place], [place]

## 2019-03-03 NOTE — Patient Instructions (Signed)
Medication Instructions:  Your physician has recommended you make the following change in your medication:  1.  START Hydralazine 25 mg taking 1 tablet by mouth three times a day.  On dialysis days, make only take 1 tablet twice a day with the 1st one being AFTER DIALYSIS 2.  On the [place] it should be 1 tablet twice a day.  ON DIALYSIS DAYS, TAKE AFTER DIALYSIS  *If you need a refill on your cardiac medications before your next appointment, please call your pharmacy*  Lab Work: TODAY:  TSH & CBC  If you have labs (blood work) drawn today and your tests are completely normal, you will receive your results only by: Marland Kitchen MyChart Message (if you have MyChart) OR . A paper copy in the mail If you have any lab test that is abnormal or we need to change your treatment, we will call you to review the results.  Testing/Procedures: [place] [place]- Long Term Monitor Instructions   Your physician has requested you wear your ZIO patch monitor 14 days.   This is a single patch monitor.  [employer] supplies one patch monitor per enrollment.  Additional stickers are not available.   Please do not apply patch if you will be having a Nuclear Stress Test, Echocardiogram, Cardiac CT, MRI, or Chest Xray during the time frame you would be wearing the monitor. The patch cannot be worn during these tests.  You cannot remove and re-apply the ZIO XT patch monitor.   Your ZIO patch monitor will be sent USPS Priority mail from [facility] directly to your home address. The monitor may also be mailed to a PO BOX if home delivery is not available.   It may take 3-5 days to receive your monitor after you have been enrolled.   Once you have received you monitor, please review enclosed instructions.  Your monitor has already been registered assigning a specific monitor serial # to you.   Applying the monitor   Shave hair from upper left chest.   Hold abrader disc by orange tab.  Rub abrader in 40 strokes over left  upper chest as indicated in your monitor instructions.   Clean area with 4 enclosed alcohol pads .  Use all pads to assure are is cleaned thoroughly.  Let dry.   Apply patch as indicated in monitor instructions.  Patch will be place under collarbone on left side of chest with arrow pointing upward.   Rub patch adhesive wings for 2 minutes.Remove white label marked "1".  Remove white label marked "2".  Rub patch adhesive wings for 2 additional minutes.   While looking in a mirror, press and release button in center of patch.  A small green light will flash 3-4 times .  This will be your only indicator the monitor has been turned on.     Do not shower for the first 24 hours.  You may shower after the first 24 hours.   Press button if you feel a symptom. You will hear a small click.  Record Date, Time and Symptom in the Patient Log Book.   When you are ready to remove patch, follow instructions on last 2 pages of Patient Log Book.  Stick patch monitor onto last page of Patient Log Book.   Place Patient Log Book in [place] box.  Use locking tab on box and tape box closed securely.  The [employer] and [employer] has [employer] on it.  Please place in mailbox as soon as  possible.  Your physician should have your test results approximately 7 days after the monitor has been mailed back to [facility].   Call [facility] at [phone] if you have questions regarding your ZIO XT patch monitor.  Call them immediately if you see an orange light blinking on your monitor.   If your monitor falls off in less than 4 days contact our Monitor department at [phone].  If your monitor becomes loose or falls off after 4 days call [employer] at [phone] for suggestions on securing your monitor.    Follow-Up: At [facility], you and your health needs are our priority.  As part of our continuing mission to provide you with exceptional heart care, we have created designated Provider Care  Teams.  These Care Teams include your primary Cardiologist (physician) and Advanced Practice Providers (APPs -  Physician Assistants and Nurse Practitioners) who all work together to provide you with the care you need, when you need it.  Your next appointment:   03/15/2019 ARRIVE AT 9:30  The format for your next appointment:   In Person  Provider:   Melina Copa, PA-C  Other Instructions

## 2019-03-04 DIAGNOSIS — N186 End stage renal disease: Secondary | ICD-10-CM | POA: Diagnosis not present

## 2019-03-04 DIAGNOSIS — Z992 Dependence on renal dialysis: Secondary | ICD-10-CM | POA: Diagnosis not present

## 2019-03-04 DIAGNOSIS — D509 Iron deficiency anemia, unspecified: Secondary | ICD-10-CM | POA: Diagnosis not present

## 2019-03-04 DIAGNOSIS — J158 Pneumonia due to other specified bacteria: Secondary | ICD-10-CM | POA: Diagnosis not present

## 2019-03-04 DIAGNOSIS — D631 Anemia in chronic kidney disease: Secondary | ICD-10-CM | POA: Diagnosis not present

## 2019-03-04 DIAGNOSIS — N2581 Secondary hyperparathyroidism of renal origin: Secondary | ICD-10-CM | POA: Diagnosis not present

## 2019-03-07 DIAGNOSIS — D509 Iron deficiency anemia, unspecified: Secondary | ICD-10-CM | POA: Diagnosis not present

## 2019-03-07 DIAGNOSIS — I12 Hypertensive chronic kidney disease with 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: Secondary | ICD-10-CM | POA: Diagnosis not present

## 2019-03-07 DIAGNOSIS — J158 Pneumonia due to other specified bacteria: Secondary | ICD-10-CM | POA: Diagnosis not present

## 2019-03-07 DIAGNOSIS — I5042 Chronic combined systolic (congestive) and diastolic (congestive) heart failure: Secondary | ICD-10-CM | POA: Diagnosis not present

## 2019-03-07 DIAGNOSIS — N2581 Secondary hyperparathyroidism of renal origin: Secondary | ICD-10-CM | POA: Diagnosis not present

## 2019-03-07 DIAGNOSIS — R002 Palpitations: Secondary | ICD-10-CM | POA: Diagnosis not present

## 2019-03-07 DIAGNOSIS — N186 End stage renal disease: Secondary | ICD-10-CM | POA: Diagnosis not present

## 2019-03-07 DIAGNOSIS — I272 Pulmonary hypertension, unspecified: Secondary | ICD-10-CM | POA: Diagnosis not present

## 2019-03-07 DIAGNOSIS — Z992 Dependence on renal dialysis: Secondary | ICD-10-CM | POA: Diagnosis not present

## 2019-03-07 DIAGNOSIS — D631 Anemia in chronic kidney disease: Secondary | ICD-10-CM | POA: Diagnosis not present

## 2019-03-08 ENCOUNTER — Telehealth: Payer: Self-pay | Admitting: Licensed Clinical Social Worker

## 2019-03-08 NOTE — Telephone Encounter (Signed)
CSW contacted patient to follow up on supportive counseling. Message left for return call. James Wall, [place], [place]

## 2019-03-09 ENCOUNTER — Ambulatory Visit (INDEPENDENT_AMBULATORY_CARE_PROVIDER_SITE_OTHER): Payer: Medicare Other

## 2019-03-09 DIAGNOSIS — N2581 Secondary hyperparathyroidism of renal origin: Secondary | ICD-10-CM | POA: Diagnosis not present

## 2019-03-09 DIAGNOSIS — J158 Pneumonia due to other specified bacteria: Secondary | ICD-10-CM | POA: Diagnosis not present

## 2019-03-09 DIAGNOSIS — D509 Iron deficiency anemia, unspecified: Secondary | ICD-10-CM | POA: Diagnosis not present

## 2019-03-09 DIAGNOSIS — R002 Palpitations: Secondary | ICD-10-CM | POA: Diagnosis not present

## 2019-03-09 DIAGNOSIS — I5042 Chronic combined systolic (congestive) and diastolic (congestive) heart failure: Secondary | ICD-10-CM | POA: Diagnosis not present

## 2019-03-09 DIAGNOSIS — N186 End stage renal disease: Secondary | ICD-10-CM | POA: Diagnosis not present

## 2019-03-09 DIAGNOSIS — I272 Pulmonary hypertension, unspecified: Secondary | ICD-10-CM | POA: Diagnosis not present

## 2019-03-09 DIAGNOSIS — Z992 Dependence on renal dialysis: Secondary | ICD-10-CM | POA: Diagnosis not present

## 2019-03-09 DIAGNOSIS — D631 Anemia in chronic kidney disease: Secondary | ICD-10-CM | POA: Diagnosis not present

## 2019-03-10 ENCOUNTER — Ambulatory Visit (INDEPENDENT_AMBULATORY_CARE_PROVIDER_SITE_OTHER): Payer: Medicare Other | Admitting: Gastroenterology

## 2019-03-10 ENCOUNTER — Encounter: Payer: Self-pay | Admitting: Gastroenterology

## 2019-03-10 VITALS — BP 132/82 | HR 60 | Temp 97.6°F | Ht 65.0 in | Wt 195.0 lb

## 2019-03-10 DIAGNOSIS — R112 Nausea with vomiting, unspecified: Secondary | ICD-10-CM | POA: Diagnosis not present

## 2019-03-10 DIAGNOSIS — K625 Hemorrhage of anus and rectum: Secondary | ICD-10-CM | POA: Diagnosis not present

## 2019-03-10 MED ORDER — PANTOPRAZOLE SODIUM 40 MG PO TBEC: 40.0000 mg | DELAYED_RELEASE_TABLET | Freq: Two times a day (BID) | ORAL | 3 refills | Status: DC

## 2019-03-10 NOTE — Patient Instructions (Signed)
-   Continue pantoprazole to 40 mg BID (90 days with 3 refills) - Trial of FDGard, take as directed  - We have given you Samples of fiber supplements provided - If you decide to have hemorrhoid banding, call at any time to schedule.

## 2019-03-10 NOTE — Progress Notes (Signed)
Referring Provider: Sherene Sires, DO Primary Care Physician:  Sherene Sires, DO   Chief complaint:  vomiting   IMPRESSION:  Intermittent nausea and vomiting x4 years    - prior work up in 2015 included colonoscopy, EGD, CT, GES, and ultrasound    - previously on omeprazole 20 mg daily    - increased omeprazole to 40 mg BID    - UGI 07/29/18: mild esophageal dysmotility, small sliding hiatal hernia, mild proximal stomach thickening    - EGD 09/07/18: Widely patent Schatzki's ring, chronic nonspecific esophagitis, H pylori - gastritis, duodenitis Rectal bleeding due to hemorrhoids ? Fatty liver +/- Cryptogenic cirrhosis    - Made at the time of the evaluation of ascites, although the SAAG was < 1.1    - Labs 2103: ANA neg, HBsAb neg, HBsAg neg, HCV Ab neg,iron 50,ferritin 308    -  Elevated TP 5.8 suggests cardiac contribution    - platelets 206,000    - echogenic liver noted on ultrasound 10/20 Microcytic anemia with a hemoglobin of 8.6 History of H pylori gastritis on EGD with Dr. Paulita Fujita 12/06/13    - unclear if full course of antibiotics were completed for treatment    - gastric biopsies negative for H pylori 09/07/18 History of colon polyps on colonoscopy 2013    - tubular adenoma 2013    - surveillance recommended 2018    - 2 tubular adenomas on colonoscopy 11/30/18    - surveillance colonoscopy recommended in 7 years ESRD on hemodialysis x 11 year (Dr. Carmina Miller) No diabetes  Chronic nausea: Extensive work-up in 2015 included a colonoscopy, egd, gastric emptying test, and ultrasound, and additional testing in 2019/2020 including CT of the abdomen/pelvis, UGI/SBFT, colonoscopy, EGD, abdomina ultrasound, and HIDA all of which were negative for a cause of vomiting. Differential continues to include: non-ulcer dyspepsia,  gastroparesis, functional nausea/vomiting and vomiting, and less likely bacterial overgrowth. I wonder if is symptoms are related to dialysis or his overall eversion to  dialysis. He doesn't feel that additional evaluation or treatment is indicated at this time. Trial of FDGard recommended as he has found peppermint to be helpful.  Consider a trial of haloperidol 0.25 to 0.72 mg po QHS, increasing to BID and then TID PRN or ondansetron 4-8 mg po BID PRN if symptoms persist.   No obvious diagnosis of cirrhosis based on review of records from Dr. Paulita Fujita. No evidence for varices or portal hypertension on recent upper endoscopy. Abdominal ultrasound 03/14/17 and in 11/2018 showed echogenic liver but no other findings except for renal cysts. In particular, there was no portal hypertension.   Rectal bleeding due to hemorrhoids.  High fiber diet today. Drink at least 64 ounces of water daily. Use a daily stool bulking agent such as Metamucil. We discussed hemorrhoidal banding. He may call at any time if he would like to schedule banding.  History of colon polyps: Two adenomas removed on colonoscopy. Surveillance recommended in 2027.   PLAN: - Continue pantoprazole to 40 mg BID (90 days with 3 refills) - Minimize medications associated with nausea - Trial of FDGard - Samples of fiber supplements provided - Patient may call at any time to schedule band ligation - Follow-up in 4-6 months, earlier if needed     - Surveillance colonoscopy in 2027   HPI: James Wall is a 54 y.o. male under evaluation of vomiting. His initial consultation was  07/12/18. Upper endoscopy performed 09/07/18. He was seen 10/21/18 with reports of rectal  bleeding. Colonoscopy was performed 11/30/18. He returns in scheduled follow-up.  The interval history is obtained through the patient and review of his electronic health record.  Previously followed by [facility] (Dr. Paulita Fujita for 4-5 years, last 2015), last seen in 2015. Diagnosed with cryptogenic cirrhosis (no alcohol use, negative serology for hepatitis B and hepatitis C, ASMA and AMA negative, unremarkable ferritin, no varices on prior endoscopy)  at that time. No history of decompensation.  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   (1) Nausea and vomiting: 4-year history of intermittent nausea and vomiting worsened after his mother's death 4 years ago on Christmas morning. Less nausea since his endoscopy. Attributes this to using his PPI BID treatment. He is watching what he eats, although he is still unable to identify food triggers.  Saltines and peppermint can provide some relief. Sometimes ginger ale will trigger his nausea if it is not at room temperature. No abdominal pain. No diarrhea or constipation.  Upper GI series 07/29/2018 showed a mild esophageal dysmotility with intermittent mild weakening of primary peristalsis in the lower esophagus, a small sliding hiatal hernia, and no reflux disease.  There is a mild fold thickening in the proximal stomach but no obvious ulcers.  There is rapid small bowel transit.  EGD and associated pathology results from 09/07/2018 showed: Widely patent Schatzki ring. Chronic esophagitis. Normal gastric biopsies. Small hiatal hernia. Peptic duodenitis.   Ultrasound showed 12/07/18:  Limited exam due to body habitus and bowel gas.Suspected fatty liver. Bilateral cortical thinning.  Subsequent HIDA 12/07/18 was negative.  (2) Rectal bleeding: Evaluated with colonoscopy.  Colonoscopy 11/30/18: Internal and external hemorrhoids. Left-sided diverticulosis, 4 small polyps - 2 tubular adenomas and 2 hyperplastic polyps.   Rectal bleeding has essentially resolved following his colonoscopy.  If he has a large bowel movement he will notice some bleeding. No rectal pain. He knows this is due to hemorrhoids. He asked about banding, but, doesn't thin the bleeding occurs enough to justify the procedure. He is not using any fiber supplements. He feels like his Renvela causes some constipation. Recipient of gay sex but not currently sexually active.      Abdominal imaging: - CT of the abdomen pelvis without contrast 03/08/2017 showed active pneumonia, changes of COPD, cardiomegaly,  bilateral inguinal hernias and a small umbilical hernia.  - Abdominal ultrasound 03/14/2017 showed an echogenic liver.  No other abdominal findings.  - Gastric emptying scan was normal 10/30/2011. - Ultrasound 12/07/18:  Limited exam due to body habitus and bowel gas.Suspected fatty liver. Bilateral cortical thinning.  - HIDA 12/07/18: negative   Past Medical History:  Diagnosis Date  . Anemia   . Anxiety   . Arthritis    GOUT - pt not sure if this is true- not sure 11-30-18  . 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 Blood transfusion without reported diagnosis   . Cirrhosis, nonalcoholic ([place])   . COPD (chronic obstructive pulmonary disease) ([place])   . Depression   . ESRD on hemodialysis [facility])    Started HD Jan 2009.  ESRD was due to HTN.  Dx'd with HTN in hospital 1996 according to pt, they had to keep him so he could get Medicaid to afford the BP medications.  First saw a nephrologist and started HD in the same year 2009.  G[facility] HD [facility] on a MWF schedule.  Does not have DM. He had a left RC AVF that never  functioned, a left upper arm AVF that worked for about 5 years and as of June  . GERD (gastroesophageal reflux disease)   . Heart murmur    as a child per pt  . Hyperlipidemia   . Hypertension   . Pneumonia 03/2017  . Renal insufficiency   . Secondary hyperparathyroidism ([place])   . Sepsis ([place]) 02/2013   from AVF , treated with Vancomycin.  Marland Kitchen Shortness of breath    With exertion  . Sleep apnea    no  longer using cpap    Past Surgical History:  Procedure Laterality Date  . AV FISTULA PLACEMENT  2009   Left lower arm AVF  . AV FISTULA PLACEMENT Right 02/22/2013   Procedure:  CREATION  OF BRACHIAL CEPHALIC FISTULA RIGHT ARM;  Surgeon: Elam Dutch, MD;  Location: [place];  Service: Vascular;  Laterality: Right;  . AV FISTULA PLACEMENT Left  08/10/2014   Procedure: BASILIC VEIN TRANSPOSITION  ARTERIOVENOUS (AV) FISTULA CREATION LEFT UPPER ARM;  Surgeon: Mal Misty, MD;  Location: [place];  Service: Vascular;  Laterality: Left;  . COLONOSCOPY    . ESOPHAGOGASTRODUODENOSCOPY (EGD) WITH PROPOFOL N/A 04/12/2013   Procedure: ESOPHAGOGASTRODUODENOSCOPY (EGD) WITH PROPOFOL;  Surgeon: Arta Silence, MD;  Location: [facility];  Service: Endoscopy;  Laterality: N/A;  . INSERTION OF DIALYSIS CATHETER N/A 12/23/2012   Procedure: INSERTION OF DIALYSIS CATHETER; ULTRASOUND GUIDED;  Surgeon: Angelia Mould, MD;  Location: [place];  Service: Vascular;  Laterality: N/A;  . INSERTION OF DIALYSIS CATHETER  10/22/2015   Right IJ non-tunneled HD catheter, placed again in 2019  . IR GENERIC HISTORICAL  10/22/2015   IR US GUIDE VASC ACCESS RIGHT 10/22/2015 [facility]  . IR GENERIC HISTORICAL  10/22/2015   IR FLUORO GUIDE CV LINE RIGHT 10/22/2015 [facility]  . IR GENERIC HISTORICAL  10/23/2015   IR FLUORO GUIDE CV LINE RIGHT 10/23/2015 Marybelle Killings, MD [facility]  . LEFT HEART CATHETERIZATION WITH CORONARY ANGIOGRAM N/A 07/13/2013   Procedure: LEFT HEART CATHETERIZATION WITH CORONARY ANGIOGRAM;  Surgeon: Jettie Booze, MD;  Location: [facility] CATH LAB;  Service: Cardiovascular;  Laterality: N/A;  . LIGATION OF ARTERIOVENOUS  FISTULA Left 12/22/2012   Procedure: LIGATION OF ARTERIOVENOUS  FISTULA;EXCISION OF LARGE ANEURYSMS;;  Surgeon: Elam Dutch, MD;  Location: [place];  Service: Vascular;  Laterality: Left;  . UPPER GASTROINTESTINAL ENDOSCOPY      Current Outpatient Medications  Medication Sig Dispense Refill  . acetaminophen (TYLENOL) 650 MG CR tablet Take 1 tablet (650 mg total) by mouth daily as needed for pain.    Marland Kitchen amLODipine (NORVASC) 10 MG tablet TAKE 1 TABLET(10 MG) BY MOUTH DAILY 90 tablet 0  . cloNIDine (CATAPRES - DOSED IN MG/24 HR) 0.3 mg/24hr patch 0.3 mg once a week.    . cloNIDine (CATAPRES - DOSED IN MG/24 HR) 0.3 mg/24hr  Place 0.3 mg onto the skin every Sunday. Saturday.    . Etelcalcetide HCl (PARSABIV IV) Etelcalcetide (Parsabiv)    . heparin 1000 unit/mL SOLN injection Heparin Sodium (Porcine) 1,000 Units/mL Systemic    . heparin 1000 unit/mL SOLN injection Heparin Sodium (Porcine) 1,000 Units/mL Catheter Lock Venous    . heparin 1000 unit/mL SOLN injection Heparin Sodium (Porcine) 1,000 Units/mL Catheter Lock Arterial    . hydrALAZINE (APRESOLINE) 25 MG tablet Take 1 tablet by mouth three times a day.  ON DIALYSIS DAYS TAKE AFTER DIALYSIS AND ONLY TAKE IT TWICE A DAY 270 tablet 3  . hydrOXYzine (  ATARAX/VISTARIL) 25 MG tablet Take 1 tablet (25 mg total) by mouth every 8 (eight) hours as needed for itching. Further refills need to come from your PCP. 30 tablet 3  . ketoconazole (NIZORAL) 2 % cream APP EXT AA BID    . metoprolol tartrate (LOPRESSOR) 100 MG tablet Take 1 tablet by mouth twice a day.  ON DIALYSIS DAYS, TAKE AFTER DIALYSIS. 180 tablet 3  . pantoprazole (PROTONIX) 40 MG tablet Take 1 tablet (40 mg total) by mouth 2 (two) times daily before a meal. 180 tablet 3  . sevelamer carbonate (RENVELA) 800 MG tablet Take 3,200-4,800 mg by mouth See admin instructions. Take 4-6 tablets (3200-4800 mg) by mouth one to two times daily with meals    . tamsulosin (FLOMAX) 0.4 MG CAPS capsule Take 0.4 mg by mouth daily.  0  . VITAMIN D, CHOLECALCIFEROL, PO Take by mouth.     No current facility-administered medications for this visit.    Allergies as of 03/10/2019 - Review Complete 03/10/2019  Allergen Reaction Noted  . Venofer  [ferric oxide] Itching 11/26/2018  . Aspirin Other (See Comments) 04/06/2008    Family History  Problem Relation Age of Onset  . Hypertension Mother   . Cerebrovascular Accident Father   . Hypertension Father   . Congestive Heart Failure Brother   . Asthma Brother   . Stomach cancer Neg Hx   . Rectal cancer Neg Hx   . Esophageal cancer Neg Hx   . Colon cancer Neg Hx      Social History   Socioeconomic History  . Marital status: Single    Spouse name: Not on file  . Number of children: 0  . Years of education: 12th  . Highest education level: Not on file  Occupational History  . Occupation: n/a  Tobacco Use  . Smoking status: Current Every Day Smoker    Packs/day: 0.25    Years: 16.00    Pack years: 4.00    Types: Cigarettes    Last attempt to quit: 03/03/2017    Years since quitting: 2.0  . Smokeless tobacco: Never Used  Substance and Sexual Activity  . Alcohol use: Yes    Alcohol/week: 0.0 standard drinks    Comment: rare  . Drug use: Yes    Frequency: 2.0 times per week    Types: Marijuana    Comment: last used 11-28-18  . Sexual activity: Not on file  Other Topics Concern  . Not on file  Social History Narrative  . Not on file   Social Determinants of Health   Financial Resource Strain:   . Difficulty of Paying Living Expenses: Not on file  Food Insecurity:   . Worried About Charity fundraiser in the Last Year: Not on file  . Ran Out of Food in the Last Year: Not on file  Transportation Needs:   . Lack of Transportation (Medical): Not on file  . Lack of Transportation (Non-Medical): Not on file  Physical Activity:   . Days of Exercise per Week: Not on file  . Minutes of Exercise per Session: Not on file  Stress:   . Feeling of Stress : Not on file  Social Connections:   . Frequency of Communication with Friends and Family: Not on file  . Frequency of Social Gatherings with Friends and Family: Not on file  . Attends Religious Services: Not on file  . Active Member of Clubs or Organizations: Not on file  . Attends Club or  Organization Meetings: Not on file  . Marital Status: Not on file  Intimate Partner Violence:   . Fear of Current or Ex-Partner: Not on file  . Emotionally Abused: Not on file  . Physically Abused: Not on file  . Sexually Abused: Not on file   Physical Exam: General:   Alert,  well-nourished,  pleasant and cooperative, in NAD.  Tearful when speaking of his mother and his desire for a committed relationship. Head:  Normocephalic and atraumatic. Eyes:  Sclera clear, no icterus.   Conjunctiva pink.. Neurologic:  Alert and  oriented x4;  grossly nonfocal Skin:  Intact without significant lesions or rashes. Psych:  Alert and cooperative. Normal mood and affect.   Danile Trier L. Tarri Glenn, MD, MPH [facility] 03/10/2019, 9:15 AM

## 2019-03-11 DIAGNOSIS — J158 Pneumonia due to other specified bacteria: Secondary | ICD-10-CM | POA: Diagnosis not present

## 2019-03-11 DIAGNOSIS — Z992 Dependence on renal dialysis: Secondary | ICD-10-CM | POA: Diagnosis not present

## 2019-03-11 DIAGNOSIS — N186 End stage renal disease: Secondary | ICD-10-CM | POA: Diagnosis not present

## 2019-03-11 DIAGNOSIS — D509 Iron deficiency anemia, unspecified: Secondary | ICD-10-CM | POA: Diagnosis not present

## 2019-03-11 DIAGNOSIS — D631 Anemia in chronic kidney disease: Secondary | ICD-10-CM | POA: Diagnosis not present

## 2019-03-11 DIAGNOSIS — N2581 Secondary hyperparathyroidism of renal origin: Secondary | ICD-10-CM | POA: Diagnosis not present

## 2019-03-14 ENCOUNTER — Encounter: Payer: Self-pay | Admitting: Physician Assistant

## 2019-03-14 DIAGNOSIS — N186 End stage renal disease: Secondary | ICD-10-CM | POA: Diagnosis not present

## 2019-03-14 DIAGNOSIS — N2581 Secondary hyperparathyroidism of renal origin: Secondary | ICD-10-CM | POA: Diagnosis not present

## 2019-03-14 DIAGNOSIS — Z992 Dependence on renal dialysis: Secondary | ICD-10-CM | POA: Diagnosis not present

## 2019-03-14 DIAGNOSIS — D509 Iron deficiency anemia, unspecified: Secondary | ICD-10-CM | POA: Diagnosis not present

## 2019-03-14 DIAGNOSIS — D631 Anemia in chronic kidney disease: Secondary | ICD-10-CM | POA: Diagnosis not present

## 2019-03-14 DIAGNOSIS — J158 Pneumonia due to other specified bacteria: Secondary | ICD-10-CM | POA: Diagnosis not present

## 2019-03-14 NOTE — Progress Notes (Signed)
Cardiology Office Note    Date:  03/15/2019   ID:  [name], [name] [birth year], MRN [id number]  PCP:  Sherene Sires, DO  Cardiologist:  Ena Dawley, MD  Electrophysiologist:  None   Chief Complaint: f/u BP, palpitations  History of Present Illness:   James Wall is a 54 y.o. male with history of HTN, NICM (EF 45-50% by echo 12/2017), severe pulmonary HTN by echo 12/2017, PSVT, PVCs, ESRD on HD for several years, liver cirrhosis, weight loss, anemia, arthritis, COPD, GERD, HLD, secondary hyperparathyroidism, OSA, who presents for f/u of blood pressure.   He has history of cardiomyopathy with echo going back to 2009 showing EF 45-50%. He previously saw Dr. Meda Coffee with exertional dyspnea and fatigue in the context of weight loss in 2015. EKG was abnormal. Nuclear stress test 06/2013 was intermediate risk with mid to distal anterior wall ischemia. He underwent cardiac cath 07/2013 showing normal coronary arteries. Holter at that time showed NSR to sinus tach and frequent PVCs. He also carries a history of PSVT. Last echocardiogram 12/2017 showed EF 45-50%, elevated LVEDP/LAFP, mild-moderate MR, moderate LAE, mild RAE, severe pulmonary HTN PASP 9mHg.  He recently saw LCecilie KicksNP 03/03/19 reporting elevated blood pressure. He was no longer taking BB or hydralazine for unclear reasons. He was also having n/v being worked up by GI. Lopressor was resumed at 1058mBID, hold for AM dialysis, and resumed on hydralazine 2568mID except BID only on HD days. He also reported intermittent tachypalpitations as well as a sensation that his heart dropped in his stomach so monitor was ordered, currently in progress. Last labs reviewed 02/2019 showed Hgb 10.2, TSH wnl, 12/2017 K 3.9, Cr 8.35, albumin 2.7, AST/ALT wnl. We do not appear to follow lipids. He was referred to social work due to overwhelmed/tearful affect in dealing with medical problems.  He is seen back for follow-up in good  spirits. It was a little confusing to follow but he just filled his medicines this past weekend and started them 03/12/19. He did notice he felt poorly taking the hydralazine TID, so has elected to take it BID only and feels better doing this. He indicates he skipped some of his doses yesterday. He notes his BP at dialysis yesterday was controlled and stable. He is slightly hypertensive today. He took his medications this morning and overall feels well without CP, dizziness, headache, SOB, swelling. He has not had any palpitations since last visit, currently wearing monitor.   Past Medical History:  Diagnosis Date  . Anemia   . Anxiety   . Arthritis    GOUT - pt not sure if this is true- not sure 11-30-18  . [place] fistula [place] ([facility]  . Blood transfusion without reported diagnosis   . Cirrhosis, nonalcoholic ([facility] . COPD (chronic obstructive pulmonary disease) ([facility] . Depression   [facility]  [facility]ed HD Jan [facility].  ESRD was due to HTN.  Dx'd with HTN in hospital 1996 according to pt, they had to keep him so he could get Medicaid to afford the BP medications.  First saw a nephrologist and started HD in the same year 2009.  [facility].  Does not have DM. He had a left RC AVF that never functioned, a left upper arm AVF that worked for about 5 years and as of June  . GERD (gastroesophageal reflux disease)   . Heart  murmur    as a child per pt  . Hyperlipidemia   . Hypertension   . NICM (nonischemic cardiomyopathy) ([place])   . PSVT (paroxysmal supraventricular tachycardia) ([place])   . Pulmonary hypertension ([place])   . PVC's (premature ventricular contractions)   . Secondary hyperparathyroidism ([place])   . Sepsis ([place]) 02/2013   from AVF , treated with Vancomycin.  . Sleep apnea    no  longer using cpap    Past Surgical History:  Procedure Laterality Date  . AV FISTULA PLACEMENT  2009   Left lower arm AVF  . AV FISTULA PLACEMENT Right  02/22/2013   Procedure:  CREATION  OF BRACHIAL CEPHALIC FISTULA RIGHT ARM;  Surgeon: Elam Dutch, MD;  Location: [place];  Service: Vascular;  Laterality: Right;  . AV FISTULA PLACEMENT Left 08/10/2014   Procedure: BASILIC VEIN TRANSPOSITION  ARTERIOVENOUS (AV) FISTULA CREATION LEFT UPPER ARM;  Surgeon: Mal Misty, MD;  Location: [place];  Service: Vascular;  Laterality: Left;  . COLONOSCOPY    . ESOPHAGOGASTRODUODENOSCOPY (EGD) WITH PROPOFOL N/A 04/12/2013   Procedure: ESOPHAGOGASTRODUODENOSCOPY (EGD) WITH PROPOFOL;  Surgeon: Arta Silence, MD;  Location: [facility];  Service: Endoscopy;  Laterality: N/A;  . INSERTION OF DIALYSIS CATHETER N/A 12/23/2012   Procedure: INSERTION OF DIALYSIS CATHETER; ULTRASOUND GUIDED;  Surgeon: Angelia Mould, MD;  Location: [place];  Service: Vascular;  Laterality: N/A;  . INSERTION OF DIALYSIS CATHETER  10/22/2015   Right IJ non-tunneled HD catheter, placed again in 2019  . IR GENERIC HISTORICAL  10/22/2015   IR US GUIDE VASC ACCESS RIGHT 10/22/2015 [facility]  . IR GENERIC HISTORICAL  10/22/2015   IR FLUORO GUIDE CV LINE RIGHT 10/22/2015 [facility]  . IR GENERIC HISTORICAL  10/23/2015   IR FLUORO GUIDE CV LINE RIGHT 10/23/2015 Marybelle Killings, MD [facility]  . LEFT HEART CATHETERIZATION WITH CORONARY ANGIOGRAM N/A 07/13/2013   Procedure: LEFT HEART CATHETERIZATION WITH CORONARY ANGIOGRAM;  Surgeon: Jettie Booze, MD;  Location: [facility] CATH LAB;  Service: Cardiovascular;  Laterality: N/A;  . LIGATION OF ARTERIOVENOUS  FISTULA Left 12/22/2012   Procedure: LIGATION OF ARTERIOVENOUS  FISTULA;EXCISION OF LARGE ANEURYSMS;;  Surgeon: Elam Dutch, MD;  Location: [place];  Service: Vascular;  Laterality: Left;  . UPPER GASTROINTESTINAL ENDOSCOPY      Current Medications: Current Meds  Medication Sig  . acetaminophen (TYLENOL) 650 MG CR tablet Take 1 tablet (650 mg total) by mouth daily as needed for pain.  Marland Kitchen amLODipine (NORVASC) 10 MG tablet TAKE 1  TABLET(10 MG) BY MOUTH DAILY  . cloNIDine (CATAPRES - DOSED IN MG/24 HR) 0.3 mg/24hr patch Place 0.3 mg onto the skin once a week.   . Etelcalcetide HCl (PARSABIV IV) Etelcalcetide (Parsabiv)  . heparin 1000 unit/mL SOLN injection Heparin Sodium (Porcine) 1,000 Units/mL Catheter Lock Venous  . hydrALAZINE (APRESOLINE) 25 MG tablet Take 25 mg by mouth 2 (two) times daily.  . hydrOXYzine (ATARAX/VISTARIL) 25 MG tablet Take 1 tablet (25 mg total) by mouth every 8 (eight) hours as needed for itching. Further refills need to come from your PCP.  Marland Kitchen ketoconazole (NIZORAL) 2 % cream APP EXT AA BID  . metoprolol tartrate (LOPRESSOR) 100 MG tablet Take 1 tablet by mouth twice a day.  ON DIALYSIS DAYS, TAKE AFTER DIALYSIS.  Marland Kitchen pantoprazole (PROTONIX) 40 MG tablet Take 1 tablet (40 mg total) by mouth 2 (two) times daily before a meal.  . sevelamer carbonate (RENVELA) 800 MG tablet Take 3,200-4,800 mg  by mouth See admin instructions. Take 4-6 tablets (3200-4800 mg) by mouth one to two times daily with meals  . tamsulosin (FLOMAX) 0.4 MG CAPS capsule Take 0.4 mg by mouth daily.  Marland Kitchen VITAMIN D, CHOLECALCIFEROL, PO Take by mouth.  . [DISCONTINUED] cloNIDine (CATAPRES - DOSED IN MG/24 HR) 0.3 mg/24hr Place 0.3 mg onto the skin every Sunday. Saturday.  . [DISCONTINUED] hydrALAZINE (APRESOLINE) 25 MG tablet Take 1 tablet by mouth three times a day.  ON DIALYSIS DAYS TAKE AFTER DIALYSIS AND ONLY TAKE IT TWICE A DAY      Allergies:   Venofer  [ferric oxide] and Aspirin   Social History   Socioeconomic History  . Marital status: Single    Spouse name: Not on file  . Number of children: 0  . Years of education: 12th  . Highest education level: Not on file  Occupational History  . Occupation: n/a  Tobacco Use  . Smoking status: Current Every Day Smoker    Packs/day: 0.25    Years: 16.00    Pack years: 4.00    Types: Cigarettes    Last attempt to quit: 03/03/2017    Years since quitting: 2.0  . Smokeless  tobacco: Never Used  Substance and Sexual Activity  . Alcohol use: Yes    Alcohol/week: 0.0 standard drinks    Comment: rare  . Drug use: Yes    Frequency: 2.0 times per week    Types: Marijuana    Comment: last used 11-28-18  . Sexual activity: Not on file  Other Topics Concern  . Not on file  Social History Narrative  . Not on file   Social Determinants of Health   Financial Resource Strain:   . Difficulty of Paying Living Expenses: Not on file  Food Insecurity:   . Worried About Charity fundraiser in the Last Year: Not on file  . Ran Out of Food in the Last Year: Not on file  Transportation Needs:   . Lack of Transportation (Medical): Not on file  . Lack of Transportation (Non-Medical): Not on file  Physical Activity:   . Days of Exercise per Week: Not on file  . Minutes of Exercise per Session: Not on file  Stress:   . Feeling of Stress : Not on file  Social Connections:   . Frequency of Communication with Friends and Family: Not on file  . Frequency of Social Gatherings with Friends and Family: Not on file  . Attends Religious Services: Not on file  . Active Member of Clubs or Organizations: Not on file  . Attends Archivist Meetings: Not on file  . Marital Status: Not on file     Family History:  The patient's family history includes Asthma in his brother; Cerebrovascular Accident in his father; Congestive Heart Failure in his brother; Hypertension in his father and mother. There is no history of Stomach cancer, Rectal cancer, Esophageal cancer, or Colon cancer.  ROS:   Please see the history of present illness.  All other systems are reviewed and otherwise negative.    EKGs/Labs/Other Studies Reviewed:    Studies reviewed are outlined and summarized above. Reports included below if pertinent.  2D echo 12/2017 - Left ventricle: Septal and inferior basal hypokinesis. The cavity  size was mildly dilated. Wall thickness was normal. Systolic    function was mildly reduced. The estimated ejection fraction was  in the range of 45% to 50%. Doppler parameters are consistent  with both elevated ventricular  end-diastolic filling pressure and  elevated left atrial filling pressure.  - Aortic valve: There was trivial regurgitation.  - Mitral valve: Moderately calcified annulus. Moderately thickened,  moderately calcified leaflets . There was mild to moderate  regurgitation.  - Left atrium: The atrium was moderately dilated.  - Right atrium: The atrium was mildly dilated.  - Atrial septum: No defect or patent foramen ovale was identified.  - Tricuspid valve: There was moderate regurgitation.  - Pulmonary arteries: PA peak pressure: 97 mm Hg (S).  - Impressions: Severe pulmonary hypertension based on TR velocity.   Impressions:   - Severe pulmonary hypertension based on TR velocity.     EKG:  EKG is not ordered today.  Recent Labs: 03/03/2019: Hemoglobin 10.2; Platelets 188; TSH 2.900  Recent Lipid Panel No results found for: CHOL, TRIG, HDL, CHOLHDL, VLDL, LDLCALC, LDLDIRECT  PHYSICAL EXAM:    VS:  BP (!) 158/90   Pulse 61   Ht 5' 5"  (1.651 m)   Wt 200 lb (90.7 kg)   SpO2 97%   BMI 33.28 kg/m   BMI: Body mass index is 33.28 kg/m.  GEN: Well nourished, well developed AAM, in no acute distress HEENT: normocephalic, atraumatic Neck: no JVD, carotid bruits, or masses Cardiac: RRR; no murmurs, rubs, or gallops, no edema. Event monitor on chest wall Respiratory:  clear to auscultation bilaterally, normal work of breathing GI: soft, nontender, nondistended, + BS MS: no deformity or atrophy Skin: warm and dry, no rash Neuro:  Alert and Oriented x 3, Strength and sensation are intact, follows commands Psych: euthymic mood, full affect  Wt Readings from Last 3 Encounters:  03/15/19 200 lb (90.7 kg)  03/10/19 195 lb (88.5 kg)  03/03/19 199 lb (90.3 kg)     ASSESSMENT & PLAN:   1. Essential HTN - he only  just started this new regimen on Saturday 03/12/19 and skipped some of his doses yesterday, therefore it's difficult to get a good sense for whether the regimen is effective yet. As above, he self decreased hydralazine to TID because the 3rd dose during the day made him feel poorly. He did not check his BP when he felt this way. He since feels like he overall did better when he took this BID instead. He is tolerating Lopressor 122m BID well. I asked him to check his BP twice daily over the next several days and call [place] readings on Friday 03/18/19 for our review. 2. Palpitations - monitor in progress. Continue BB. No recurrent palpitations recently.  3. H/o NICM with severe pulmonary HTN - PASP was markedly elevated in 2019 although he is not describing any current symptoms c/w pulmonary HTN or angina. We will re-evaluate with echocardiogram. Not on ACEI/ARB/Spiro due to his CKD. Volume managed with HD. 4. Mild-moderate mitral regurgitation - no symptoms to suggest clinical progression. We are obtaining echocardiogram for his pulmonary HTN at which time we can also get a better look at his valve structure.  Disposition: F/u with Dr. NMeda Coffeeor me in 3-4 months. Would consider referral to AHF clinic if pulmonary HTN is still severe.  Medication Adjustments/Labs and Tests Ordered: Current medicines are reviewed at length with the patient today.  Concerns regarding medicines are outlined above. Medication changes, Labs and Tests ordered today are summarized above and listed in the Patient Instructions accessible in Encounters.   Signed, DCharlie Pitter PA-C  03/15/2019 11:04 AM    [place]  [place]  [id number] Phone: [phone]; Fax: [phone]

## 2019-03-15 ENCOUNTER — Other Ambulatory Visit: Payer: Self-pay

## 2019-03-15 ENCOUNTER — Ambulatory Visit (INDEPENDENT_AMBULATORY_CARE_PROVIDER_SITE_OTHER): Payer: Medicare Other | Admitting: Physician Assistant

## 2019-03-15 ENCOUNTER — Encounter: Payer: Self-pay | Admitting: Physician Assistant

## 2019-03-15 VITALS — BP 158/90 | HR 61 | Ht 65.0 in | Wt 200.0 lb

## 2019-03-15 DIAGNOSIS — I272 Pulmonary hypertension, unspecified: Secondary | ICD-10-CM | POA: Diagnosis not present

## 2019-03-15 DIAGNOSIS — I10 Essential (primary) hypertension: Secondary | ICD-10-CM | POA: Diagnosis not present

## 2019-03-15 DIAGNOSIS — R002 Palpitations: Secondary | ICD-10-CM

## 2019-03-15 DIAGNOSIS — I428 Other cardiomyopathies: Secondary | ICD-10-CM

## 2019-03-15 DIAGNOSIS — I340 Nonrheumatic mitral (valve) insufficiency: Secondary | ICD-10-CM

## 2019-03-15 NOTE — Patient Instructions (Addendum)
Medication Instructions:  Your physician recommends that you continue on your current medications as directed. Please refer to the Current Medication list given to you today.  *If you need a refill on your cardiac medications before your next appointment, please call your pharmacy*  Lab Work: None ordered  If you have labs (blood work) drawn today and your tests are completely normal, you will receive your results only by: Marland Kitchen MyChart Message (if you have MyChart) OR . A paper copy in the mail If you have any lab test that is abnormal or we need to change your treatment, we will call you to review the results.  Testing/Procedures: Your physician has requested that you have an echocardiogram 03/24/2019 ARRIVE AT 7:05 FOR REGISTRATOIN.  Echocardiography is a painless test that uses sound waves to create images of your heart. It provides your doctor with information about the size and shape of your heart and how well your heart's chambers and valves are working. This procedure takes approximately one hour. There are no restrictions for this procedure.    Follow-Up: At [facility], you and your health needs are our priority.  As part of our continuing mission to provide you with exceptional heart care, we have created designated Provider Care Teams.  These Care Teams include your primary Cardiologist (physician) and Advanced Practice Providers (APPs -  Physician Assistants and Nurse Practitioners) who all work together to provide you with the care you need, when you need it.  Your next appointment:   3 month(s)   06/14/2019 ARRIVE AT 9:25 FOR REGISTRATION   The format for your next appointment:   In Person  Provider:   You may see Ena Dawley, MD or one of the following Advanced Practice Providers on your designated Care Team:    Melina Copa, PA-C  Ermalinda Barrios, PA-C   Other Instructions  Check your blood pressure 2X's a day and call us on Friday with those readings.     Echocardiogram An echocardiogram is a procedure that uses painless sound waves (ultrasound) to produce an image of the heart. Images from an echocardiogram can provide important information about:  Signs of coronary artery disease (CAD).  Aneurysm detection. An aneurysm is a weak or damaged part of an artery wall that bulges out from the normal force of blood pumping through the body.  Heart size and shape. Changes in the size or shape of the heart can be associated with certain conditions, including heart failure, aneurysm, and CAD.  Heart muscle function.  Heart valve function.  Signs of a past heart attack.  Fluid buildup around the heart.  Thickening of the heart muscle.  A tumor or infectious growth around the heart valves. Tell a health care provider about:  Any allergies you have.  All medicines you are taking, including vitamins, herbs, eye drops, creams, and over-the-counter medicines.  Any blood disorders you have.  Any surgeries you have had.  Any medical conditions you have.  Whether you are pregnant or may be pregnant. What are the risks? Generally, this is a safe procedure. However, problems may occur, including:  Allergic reaction to dye (contrast) that may be used during the procedure. What happens before the procedure? No specific preparation is needed. You may eat and drink normally. What happens during the procedure?   An IV tube may be inserted into one of your veins.  You may receive contrast through this tube. A contrast is an injection that improves the quality of the pictures from  your heart.  A gel will be applied to your chest.  A wand-like tool (transducer) will be moved over your chest. The gel will help to transmit the sound waves from the transducer.  The sound waves will harmlessly bounce off of your heart to allow the heart images to be captured in real-time motion. The images will be recorded on a computer. The procedure may  vary among health care providers and hospitals. What happens after the procedure?  You may return to your normal, everyday life, including diet, activities, and medicines, unless your health care provider tells you not to do that. Summary  An echocardiogram is a procedure that uses painless sound waves (ultrasound) to produce an image of the heart.  Images from an echocardiogram can provide important information about the size and shape of your heart, heart muscle function, heart valve function, and fluid buildup around your heart.  You do not need to do anything to prepare before this procedure. You may eat and drink normally.  After the echocardiogram is completed, you may return to your normal, everyday life, unless your health care provider tells you not to do that. This information is not intended to replace advice given to you by your health care provider. Make sure you discuss any questions you have with your health care provider. Document Revised: 05/13/2018 Document Reviewed: 02/23/2016 Elsevier Patient Education  [place].

## 2019-03-16 DIAGNOSIS — D631 Anemia in chronic kidney disease: Secondary | ICD-10-CM | POA: Diagnosis not present

## 2019-03-16 DIAGNOSIS — J158 Pneumonia due to other specified bacteria: Secondary | ICD-10-CM | POA: Diagnosis not present

## 2019-03-16 DIAGNOSIS — N186 End stage renal disease: Secondary | ICD-10-CM | POA: Diagnosis not present

## 2019-03-16 DIAGNOSIS — N2581 Secondary hyperparathyroidism of renal origin: Secondary | ICD-10-CM | POA: Diagnosis not present

## 2019-03-16 DIAGNOSIS — Z992 Dependence on renal dialysis: Secondary | ICD-10-CM | POA: Diagnosis not present

## 2019-03-16 DIAGNOSIS — D509 Iron deficiency anemia, unspecified: Secondary | ICD-10-CM | POA: Diagnosis not present

## 2019-03-18 DIAGNOSIS — D509 Iron deficiency anemia, unspecified: Secondary | ICD-10-CM | POA: Diagnosis not present

## 2019-03-18 DIAGNOSIS — Z992 Dependence on renal dialysis: Secondary | ICD-10-CM | POA: Diagnosis not present

## 2019-03-18 DIAGNOSIS — J158 Pneumonia due to other specified bacteria: Secondary | ICD-10-CM | POA: Diagnosis not present

## 2019-03-18 DIAGNOSIS — N2581 Secondary hyperparathyroidism of renal origin: Secondary | ICD-10-CM | POA: Diagnosis not present

## 2019-03-18 DIAGNOSIS — N186 End stage renal disease: Secondary | ICD-10-CM | POA: Diagnosis not present

## 2019-03-18 DIAGNOSIS — D631 Anemia in chronic kidney disease: Secondary | ICD-10-CM | POA: Diagnosis not present

## 2019-03-21 ENCOUNTER — Other Ambulatory Visit (HOSPITAL_COMMUNITY): Payer: Medicare Other

## 2019-03-21 DIAGNOSIS — D509 Iron deficiency anemia, unspecified: Secondary | ICD-10-CM | POA: Diagnosis not present

## 2019-03-21 DIAGNOSIS — N186 End stage renal disease: Secondary | ICD-10-CM | POA: Diagnosis not present

## 2019-03-21 DIAGNOSIS — D631 Anemia in chronic kidney disease: Secondary | ICD-10-CM | POA: Diagnosis not present

## 2019-03-21 DIAGNOSIS — Z992 Dependence on renal dialysis: Secondary | ICD-10-CM | POA: Diagnosis not present

## 2019-03-21 DIAGNOSIS — N2581 Secondary hyperparathyroidism of renal origin: Secondary | ICD-10-CM | POA: Diagnosis not present

## 2019-03-21 DIAGNOSIS — J158 Pneumonia due to other specified bacteria: Secondary | ICD-10-CM | POA: Diagnosis not present

## 2019-03-23 ENCOUNTER — Telehealth: Payer: Self-pay

## 2019-03-23 DIAGNOSIS — N186 End stage renal disease: Secondary | ICD-10-CM | POA: Diagnosis not present

## 2019-03-23 DIAGNOSIS — D509 Iron deficiency anemia, unspecified: Secondary | ICD-10-CM | POA: Diagnosis not present

## 2019-03-23 DIAGNOSIS — N2581 Secondary hyperparathyroidism of renal origin: Secondary | ICD-10-CM | POA: Diagnosis not present

## 2019-03-23 DIAGNOSIS — D631 Anemia in chronic kidney disease: Secondary | ICD-10-CM | POA: Diagnosis not present

## 2019-03-23 DIAGNOSIS — J158 Pneumonia due to other specified bacteria: Secondary | ICD-10-CM | POA: Diagnosis not present

## 2019-03-23 DIAGNOSIS — Z992 Dependence on renal dialysis: Secondary | ICD-10-CM | POA: Diagnosis not present

## 2019-03-23 NOTE — Telephone Encounter (Signed)
Echo Supervisor Lenard Forth, did reschedule this pts echo for 03/29/19 at 0730, due to inclement weather expected tomorrow. Lorriane Shire left the pt a detailed message to call the office back and confirm this appointment or change to another date.

## 2019-03-23 NOTE — Telephone Encounter (Addendum)
Will route this communication to Woodloch Dunn's PA-C CMA for further follow-up with the pt.  Dayna ordered his echo on on 03/15/19.  Will send [facility] and echo dept a message,  to call the pt back to reschedule his echo, due to inclement weather tomorrow.

## 2019-03-23 NOTE — Telephone Encounter (Signed)
Pt called wanting to cancel his echocardiogram appointment for 03/24/19. Pt already has an appt on 25th and cannot be schedule on that day. Patient would like a call back to reschedule. Please address.

## 2019-03-24 ENCOUNTER — Other Ambulatory Visit (HOSPITAL_COMMUNITY): Payer: Medicare Other

## 2019-03-25 DIAGNOSIS — J158 Pneumonia due to other specified bacteria: Secondary | ICD-10-CM | POA: Diagnosis not present

## 2019-03-25 DIAGNOSIS — D509 Iron deficiency anemia, unspecified: Secondary | ICD-10-CM | POA: Diagnosis not present

## 2019-03-25 DIAGNOSIS — Z992 Dependence on renal dialysis: Secondary | ICD-10-CM | POA: Diagnosis not present

## 2019-03-25 DIAGNOSIS — D631 Anemia in chronic kidney disease: Secondary | ICD-10-CM | POA: Diagnosis not present

## 2019-03-25 DIAGNOSIS — N2581 Secondary hyperparathyroidism of renal origin: Secondary | ICD-10-CM | POA: Diagnosis not present

## 2019-03-25 DIAGNOSIS — N186 End stage renal disease: Secondary | ICD-10-CM | POA: Diagnosis not present

## 2019-03-28 DIAGNOSIS — N186 End stage renal disease: Secondary | ICD-10-CM | POA: Diagnosis not present

## 2019-03-28 DIAGNOSIS — N2581 Secondary hyperparathyroidism of renal origin: Secondary | ICD-10-CM | POA: Diagnosis not present

## 2019-03-28 DIAGNOSIS — D509 Iron deficiency anemia, unspecified: Secondary | ICD-10-CM | POA: Diagnosis not present

## 2019-03-28 DIAGNOSIS — Z992 Dependence on renal dialysis: Secondary | ICD-10-CM | POA: Diagnosis not present

## 2019-03-28 DIAGNOSIS — J158 Pneumonia due to other specified bacteria: Secondary | ICD-10-CM | POA: Diagnosis not present

## 2019-03-28 DIAGNOSIS — D631 Anemia in chronic kidney disease: Secondary | ICD-10-CM | POA: Diagnosis not present

## 2019-03-29 ENCOUNTER — Other Ambulatory Visit (HOSPITAL_COMMUNITY): Payer: Medicare Other

## 2019-03-30 DIAGNOSIS — D631 Anemia in chronic kidney disease: Secondary | ICD-10-CM | POA: Diagnosis not present

## 2019-03-30 DIAGNOSIS — J158 Pneumonia due to other specified bacteria: Secondary | ICD-10-CM | POA: Diagnosis not present

## 2019-03-30 DIAGNOSIS — N186 End stage renal disease: Secondary | ICD-10-CM | POA: Diagnosis not present

## 2019-03-30 DIAGNOSIS — N2581 Secondary hyperparathyroidism of renal origin: Secondary | ICD-10-CM | POA: Diagnosis not present

## 2019-03-30 DIAGNOSIS — Z992 Dependence on renal dialysis: Secondary | ICD-10-CM | POA: Diagnosis not present

## 2019-03-30 DIAGNOSIS — D509 Iron deficiency anemia, unspecified: Secondary | ICD-10-CM | POA: Diagnosis not present

## 2019-04-01 DIAGNOSIS — N2581 Secondary hyperparathyroidism of renal origin: Secondary | ICD-10-CM | POA: Diagnosis not present

## 2019-04-01 DIAGNOSIS — N186 End stage renal disease: Secondary | ICD-10-CM | POA: Diagnosis not present

## 2019-04-01 DIAGNOSIS — D509 Iron deficiency anemia, unspecified: Secondary | ICD-10-CM | POA: Diagnosis not present

## 2019-04-01 DIAGNOSIS — D631 Anemia in chronic kidney disease: Secondary | ICD-10-CM | POA: Diagnosis not present

## 2019-04-01 DIAGNOSIS — Z992 Dependence on renal dialysis: Secondary | ICD-10-CM | POA: Diagnosis not present

## 2019-04-01 DIAGNOSIS — J158 Pneumonia due to other specified bacteria: Secondary | ICD-10-CM | POA: Diagnosis not present

## 2019-04-04 DIAGNOSIS — D631 Anemia in chronic kidney disease: Secondary | ICD-10-CM | POA: Diagnosis not present

## 2019-04-04 DIAGNOSIS — N2581 Secondary hyperparathyroidism of renal origin: Secondary | ICD-10-CM | POA: Diagnosis not present

## 2019-04-04 DIAGNOSIS — J158 Pneumonia due to other specified bacteria: Secondary | ICD-10-CM | POA: Diagnosis not present

## 2019-04-04 DIAGNOSIS — I12 Hypertensive chronic kidney disease with 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: Secondary | ICD-10-CM | POA: Diagnosis not present

## 2019-04-04 DIAGNOSIS — D689 Coagulation defect, unspecified: Secondary | ICD-10-CM | POA: Diagnosis not present

## 2019-04-04 DIAGNOSIS — Z992 Dependence on renal dialysis: Secondary | ICD-10-CM | POA: Diagnosis not present

## 2019-04-04 DIAGNOSIS — N186 End stage renal disease: Secondary | ICD-10-CM | POA: Diagnosis not present

## 2019-04-04 DIAGNOSIS — D509 Iron deficiency anemia, unspecified: Secondary | ICD-10-CM | POA: Diagnosis not present

## 2019-04-06 DIAGNOSIS — J158 Pneumonia due to other specified bacteria: Secondary | ICD-10-CM | POA: Diagnosis not present

## 2019-04-06 DIAGNOSIS — N186 End stage renal disease: Secondary | ICD-10-CM | POA: Diagnosis not present

## 2019-04-06 DIAGNOSIS — Z992 Dependence on renal dialysis: Secondary | ICD-10-CM | POA: Diagnosis not present

## 2019-04-06 DIAGNOSIS — D509 Iron deficiency anemia, unspecified: Secondary | ICD-10-CM | POA: Diagnosis not present

## 2019-04-06 DIAGNOSIS — N2581 Secondary hyperparathyroidism of renal origin: Secondary | ICD-10-CM | POA: Diagnosis not present

## 2019-04-06 DIAGNOSIS — D689 Coagulation defect, unspecified: Secondary | ICD-10-CM | POA: Diagnosis not present

## 2019-04-08 DIAGNOSIS — J158 Pneumonia due to other specified bacteria: Secondary | ICD-10-CM | POA: Diagnosis not present

## 2019-04-08 DIAGNOSIS — Z992 Dependence on renal dialysis: Secondary | ICD-10-CM | POA: Diagnosis not present

## 2019-04-08 DIAGNOSIS — D689 Coagulation defect, unspecified: Secondary | ICD-10-CM | POA: Diagnosis not present

## 2019-04-08 DIAGNOSIS — N186 End stage renal disease: Secondary | ICD-10-CM | POA: Diagnosis not present

## 2019-04-08 DIAGNOSIS — D509 Iron deficiency anemia, unspecified: Secondary | ICD-10-CM | POA: Diagnosis not present

## 2019-04-08 DIAGNOSIS — N2581 Secondary hyperparathyroidism of renal origin: Secondary | ICD-10-CM | POA: Diagnosis not present

## 2019-04-11 DIAGNOSIS — D509 Iron deficiency anemia, unspecified: Secondary | ICD-10-CM | POA: Diagnosis not present

## 2019-04-11 DIAGNOSIS — D689 Coagulation defect, unspecified: Secondary | ICD-10-CM | POA: Diagnosis not present

## 2019-04-11 DIAGNOSIS — N186 End stage renal disease: Secondary | ICD-10-CM | POA: Diagnosis not present

## 2019-04-11 DIAGNOSIS — Z992 Dependence on renal dialysis: Secondary | ICD-10-CM | POA: Diagnosis not present

## 2019-04-11 DIAGNOSIS — J158 Pneumonia due to other specified bacteria: Secondary | ICD-10-CM | POA: Diagnosis not present

## 2019-04-11 DIAGNOSIS — N2581 Secondary hyperparathyroidism of renal origin: Secondary | ICD-10-CM | POA: Diagnosis not present

## 2019-04-13 DIAGNOSIS — D509 Iron deficiency anemia, unspecified: Secondary | ICD-10-CM | POA: Diagnosis not present

## 2019-04-13 DIAGNOSIS — D689 Coagulation defect, unspecified: Secondary | ICD-10-CM | POA: Diagnosis not present

## 2019-04-13 DIAGNOSIS — Z992 Dependence on renal dialysis: Secondary | ICD-10-CM | POA: Diagnosis not present

## 2019-04-13 DIAGNOSIS — J158 Pneumonia due to other specified bacteria: Secondary | ICD-10-CM | POA: Diagnosis not present

## 2019-04-13 DIAGNOSIS — N2581 Secondary hyperparathyroidism of renal origin: Secondary | ICD-10-CM | POA: Diagnosis not present

## 2019-04-13 DIAGNOSIS — N186 End stage renal disease: Secondary | ICD-10-CM | POA: Diagnosis not present

## 2019-04-15 DIAGNOSIS — Z992 Dependence on renal dialysis: Secondary | ICD-10-CM | POA: Diagnosis not present

## 2019-04-15 DIAGNOSIS — D689 Coagulation defect, unspecified: Secondary | ICD-10-CM | POA: Diagnosis not present

## 2019-04-15 DIAGNOSIS — N2581 Secondary hyperparathyroidism of renal origin: Secondary | ICD-10-CM | POA: Diagnosis not present

## 2019-04-15 DIAGNOSIS — J158 Pneumonia due to other specified bacteria: Secondary | ICD-10-CM | POA: Diagnosis not present

## 2019-04-15 DIAGNOSIS — N186 End stage renal disease: Secondary | ICD-10-CM | POA: Diagnosis not present

## 2019-04-15 DIAGNOSIS — D509 Iron deficiency anemia, unspecified: Secondary | ICD-10-CM | POA: Diagnosis not present

## 2019-04-18 DIAGNOSIS — N2581 Secondary hyperparathyroidism of renal origin: Secondary | ICD-10-CM | POA: Diagnosis not present

## 2019-04-18 DIAGNOSIS — Z992 Dependence on renal dialysis: Secondary | ICD-10-CM | POA: Diagnosis not present

## 2019-04-18 DIAGNOSIS — D509 Iron deficiency anemia, unspecified: Secondary | ICD-10-CM | POA: Diagnosis not present

## 2019-04-18 DIAGNOSIS — D689 Coagulation defect, unspecified: Secondary | ICD-10-CM | POA: Diagnosis not present

## 2019-04-18 DIAGNOSIS — N186 End stage renal disease: Secondary | ICD-10-CM | POA: Diagnosis not present

## 2019-04-18 DIAGNOSIS — J158 Pneumonia due to other specified bacteria: Secondary | ICD-10-CM | POA: Diagnosis not present

## 2019-04-20 DIAGNOSIS — N2581 Secondary hyperparathyroidism of renal origin: Secondary | ICD-10-CM | POA: Diagnosis not present

## 2019-04-20 DIAGNOSIS — D689 Coagulation defect, unspecified: Secondary | ICD-10-CM | POA: Diagnosis not present

## 2019-04-20 DIAGNOSIS — Z992 Dependence on renal dialysis: Secondary | ICD-10-CM | POA: Diagnosis not present

## 2019-04-20 DIAGNOSIS — N186 End stage renal disease: Secondary | ICD-10-CM | POA: Diagnosis not present

## 2019-04-20 DIAGNOSIS — D509 Iron deficiency anemia, unspecified: Secondary | ICD-10-CM | POA: Diagnosis not present

## 2019-04-20 DIAGNOSIS — J158 Pneumonia due to other specified bacteria: Secondary | ICD-10-CM | POA: Diagnosis not present

## 2019-04-22 DIAGNOSIS — D689 Coagulation defect, unspecified: Secondary | ICD-10-CM | POA: Diagnosis not present

## 2019-04-22 DIAGNOSIS — D509 Iron deficiency anemia, unspecified: Secondary | ICD-10-CM | POA: Diagnosis not present

## 2019-04-22 DIAGNOSIS — N186 End stage renal disease: Secondary | ICD-10-CM | POA: Diagnosis not present

## 2019-04-22 DIAGNOSIS — Z992 Dependence on renal dialysis: Secondary | ICD-10-CM | POA: Diagnosis not present

## 2019-04-22 DIAGNOSIS — N2581 Secondary hyperparathyroidism of renal origin: Secondary | ICD-10-CM | POA: Diagnosis not present

## 2019-04-22 DIAGNOSIS — J158 Pneumonia due to other specified bacteria: Secondary | ICD-10-CM | POA: Diagnosis not present

## 2019-04-25 DIAGNOSIS — N186 End stage renal disease: Secondary | ICD-10-CM | POA: Diagnosis not present

## 2019-04-25 DIAGNOSIS — J158 Pneumonia due to other specified bacteria: Secondary | ICD-10-CM | POA: Diagnosis not present

## 2019-04-25 DIAGNOSIS — N2581 Secondary hyperparathyroidism of renal origin: Secondary | ICD-10-CM | POA: Diagnosis not present

## 2019-04-25 DIAGNOSIS — D509 Iron deficiency anemia, unspecified: Secondary | ICD-10-CM | POA: Diagnosis not present

## 2019-04-25 DIAGNOSIS — D689 Coagulation defect, unspecified: Secondary | ICD-10-CM | POA: Diagnosis not present

## 2019-04-25 DIAGNOSIS — Z992 Dependence on renal dialysis: Secondary | ICD-10-CM | POA: Diagnosis not present

## 2019-04-27 DIAGNOSIS — N2581 Secondary hyperparathyroidism of renal origin: Secondary | ICD-10-CM | POA: Diagnosis not present

## 2019-04-27 DIAGNOSIS — D509 Iron deficiency anemia, unspecified: Secondary | ICD-10-CM | POA: Diagnosis not present

## 2019-04-27 DIAGNOSIS — D689 Coagulation defect, unspecified: Secondary | ICD-10-CM | POA: Diagnosis not present

## 2019-04-27 DIAGNOSIS — Z992 Dependence on renal dialysis: Secondary | ICD-10-CM | POA: Diagnosis not present

## 2019-04-27 DIAGNOSIS — J158 Pneumonia due to other specified bacteria: Secondary | ICD-10-CM | POA: Diagnosis not present

## 2019-04-27 DIAGNOSIS — N186 End stage renal disease: Secondary | ICD-10-CM | POA: Diagnosis not present

## 2019-04-29 DIAGNOSIS — N186 End stage renal disease: Secondary | ICD-10-CM | POA: Diagnosis not present

## 2019-04-29 DIAGNOSIS — D509 Iron deficiency anemia, unspecified: Secondary | ICD-10-CM | POA: Diagnosis not present

## 2019-04-29 DIAGNOSIS — Z992 Dependence on renal dialysis: Secondary | ICD-10-CM | POA: Diagnosis not present

## 2019-04-29 DIAGNOSIS — J158 Pneumonia due to other specified bacteria: Secondary | ICD-10-CM | POA: Diagnosis not present

## 2019-04-29 DIAGNOSIS — N2581 Secondary hyperparathyroidism of renal origin: Secondary | ICD-10-CM | POA: Diagnosis not present

## 2019-04-29 DIAGNOSIS — D689 Coagulation defect, unspecified: Secondary | ICD-10-CM | POA: Diagnosis not present

## 2019-05-02 DIAGNOSIS — D509 Iron deficiency anemia, unspecified: Secondary | ICD-10-CM | POA: Diagnosis not present

## 2019-05-02 DIAGNOSIS — D689 Coagulation defect, unspecified: Secondary | ICD-10-CM | POA: Diagnosis not present

## 2019-05-02 DIAGNOSIS — J158 Pneumonia due to other specified bacteria: Secondary | ICD-10-CM | POA: Diagnosis not present

## 2019-05-02 DIAGNOSIS — Z992 Dependence on renal dialysis: Secondary | ICD-10-CM | POA: Diagnosis not present

## 2019-05-02 DIAGNOSIS — N186 End stage renal disease: Secondary | ICD-10-CM | POA: Diagnosis not present

## 2019-05-02 DIAGNOSIS — N2581 Secondary hyperparathyroidism of renal origin: Secondary | ICD-10-CM | POA: Diagnosis not present

## 2019-05-04 DIAGNOSIS — J158 Pneumonia due to other specified bacteria: Secondary | ICD-10-CM | POA: Diagnosis not present

## 2019-05-04 DIAGNOSIS — Z992 Dependence on renal dialysis: Secondary | ICD-10-CM | POA: Diagnosis not present

## 2019-05-04 DIAGNOSIS — N186 End stage renal disease: Secondary | ICD-10-CM | POA: Diagnosis not present

## 2019-05-04 DIAGNOSIS — D689 Coagulation defect, unspecified: Secondary | ICD-10-CM | POA: Diagnosis not present

## 2019-05-04 DIAGNOSIS — N2581 Secondary hyperparathyroidism of renal origin: Secondary | ICD-10-CM | POA: Diagnosis not present

## 2019-05-04 DIAGNOSIS — D509 Iron deficiency anemia, unspecified: Secondary | ICD-10-CM | POA: Diagnosis not present

## 2019-05-05 DIAGNOSIS — Z992 Dependence on renal dialysis: Secondary | ICD-10-CM | POA: Diagnosis not present

## 2019-05-05 DIAGNOSIS — N186 End stage renal disease: Secondary | ICD-10-CM | POA: Diagnosis not present

## 2019-05-05 DIAGNOSIS — I12 Hypertensive chronic kidney disease with 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: Secondary | ICD-10-CM | POA: Diagnosis not present

## 2019-05-06 DIAGNOSIS — D509 Iron deficiency anemia, unspecified: Secondary | ICD-10-CM | POA: Diagnosis not present

## 2019-05-06 DIAGNOSIS — N186 End stage renal disease: Secondary | ICD-10-CM | POA: Diagnosis not present

## 2019-05-06 DIAGNOSIS — J158 Pneumonia due to other specified bacteria: Secondary | ICD-10-CM | POA: Diagnosis not present

## 2019-05-06 DIAGNOSIS — N2581 Secondary hyperparathyroidism of renal origin: Secondary | ICD-10-CM | POA: Diagnosis not present

## 2019-05-06 DIAGNOSIS — D631 Anemia in chronic kidney disease: Secondary | ICD-10-CM | POA: Diagnosis not present

## 2019-05-06 DIAGNOSIS — Z992 Dependence on renal dialysis: Secondary | ICD-10-CM | POA: Diagnosis not present

## 2019-05-09 DIAGNOSIS — J158 Pneumonia due to other specified bacteria: Secondary | ICD-10-CM | POA: Diagnosis not present

## 2019-05-09 DIAGNOSIS — N2581 Secondary hyperparathyroidism of renal origin: Secondary | ICD-10-CM | POA: Diagnosis not present

## 2019-05-09 DIAGNOSIS — D509 Iron deficiency anemia, unspecified: Secondary | ICD-10-CM | POA: Diagnosis not present

## 2019-05-09 DIAGNOSIS — D631 Anemia in chronic kidney disease: Secondary | ICD-10-CM | POA: Diagnosis not present

## 2019-05-09 DIAGNOSIS — N186 End stage renal disease: Secondary | ICD-10-CM | POA: Diagnosis not present

## 2019-05-09 DIAGNOSIS — Z992 Dependence on renal dialysis: Secondary | ICD-10-CM | POA: Diagnosis not present

## 2019-05-11 DIAGNOSIS — D631 Anemia in chronic kidney disease: Secondary | ICD-10-CM | POA: Diagnosis not present

## 2019-05-11 DIAGNOSIS — D509 Iron deficiency anemia, unspecified: Secondary | ICD-10-CM | POA: Diagnosis not present

## 2019-05-11 DIAGNOSIS — J158 Pneumonia due to other specified bacteria: Secondary | ICD-10-CM | POA: Diagnosis not present

## 2019-05-11 DIAGNOSIS — Z992 Dependence on renal dialysis: Secondary | ICD-10-CM | POA: Diagnosis not present

## 2019-05-11 DIAGNOSIS — N186 End stage renal disease: Secondary | ICD-10-CM | POA: Diagnosis not present

## 2019-05-11 DIAGNOSIS — N2581 Secondary hyperparathyroidism of renal origin: Secondary | ICD-10-CM | POA: Diagnosis not present

## 2019-05-13 DIAGNOSIS — N186 End stage renal disease: Secondary | ICD-10-CM | POA: Diagnosis not present

## 2019-05-13 DIAGNOSIS — D509 Iron deficiency anemia, unspecified: Secondary | ICD-10-CM | POA: Diagnosis not present

## 2019-05-13 DIAGNOSIS — J158 Pneumonia due to other specified bacteria: Secondary | ICD-10-CM | POA: Diagnosis not present

## 2019-05-13 DIAGNOSIS — D631 Anemia in chronic kidney disease: Secondary | ICD-10-CM | POA: Diagnosis not present

## 2019-05-13 DIAGNOSIS — Z992 Dependence on renal dialysis: Secondary | ICD-10-CM | POA: Diagnosis not present

## 2019-05-13 DIAGNOSIS — N2581 Secondary hyperparathyroidism of renal origin: Secondary | ICD-10-CM | POA: Diagnosis not present

## 2019-05-13 NOTE — Telephone Encounter (Signed)
[facility] has not received monitor back from patient. They have attempted to contact the patient on 3 occasions to request monitor be returned to [facility].  CANCELLED ORDER 

## 2019-05-16 DIAGNOSIS — N186 End stage renal disease: Secondary | ICD-10-CM | POA: Diagnosis not present

## 2019-05-16 DIAGNOSIS — D631 Anemia in chronic kidney disease: Secondary | ICD-10-CM | POA: Diagnosis not present

## 2019-05-16 DIAGNOSIS — D509 Iron deficiency anemia, unspecified: Secondary | ICD-10-CM | POA: Diagnosis not present

## 2019-05-16 DIAGNOSIS — Z992 Dependence on renal dialysis: Secondary | ICD-10-CM | POA: Diagnosis not present

## 2019-05-16 DIAGNOSIS — N2581 Secondary hyperparathyroidism of renal origin: Secondary | ICD-10-CM | POA: Diagnosis not present

## 2019-05-16 DIAGNOSIS — J158 Pneumonia due to other specified bacteria: Secondary | ICD-10-CM | POA: Diagnosis not present

## 2019-05-18 DIAGNOSIS — N2581 Secondary hyperparathyroidism of renal origin: Secondary | ICD-10-CM | POA: Diagnosis not present

## 2019-05-18 DIAGNOSIS — D509 Iron deficiency anemia, unspecified: Secondary | ICD-10-CM | POA: Diagnosis not present

## 2019-05-18 DIAGNOSIS — Z992 Dependence on renal dialysis: Secondary | ICD-10-CM | POA: Diagnosis not present

## 2019-05-18 DIAGNOSIS — N186 End stage renal disease: Secondary | ICD-10-CM | POA: Diagnosis not present

## 2019-05-18 DIAGNOSIS — J158 Pneumonia due to other specified bacteria: Secondary | ICD-10-CM | POA: Diagnosis not present

## 2019-05-18 DIAGNOSIS — D631 Anemia in chronic kidney disease: Secondary | ICD-10-CM | POA: Diagnosis not present

## 2019-05-20 DIAGNOSIS — Z992 Dependence on renal dialysis: Secondary | ICD-10-CM | POA: Diagnosis not present

## 2019-05-20 DIAGNOSIS — J158 Pneumonia due to other specified bacteria: Secondary | ICD-10-CM | POA: Diagnosis not present

## 2019-05-20 DIAGNOSIS — D509 Iron deficiency anemia, unspecified: Secondary | ICD-10-CM | POA: Diagnosis not present

## 2019-05-20 DIAGNOSIS — N2581 Secondary hyperparathyroidism of renal origin: Secondary | ICD-10-CM | POA: Diagnosis not present

## 2019-05-20 DIAGNOSIS — D631 Anemia in chronic kidney disease: Secondary | ICD-10-CM | POA: Diagnosis not present

## 2019-05-20 DIAGNOSIS — N186 End stage renal disease: Secondary | ICD-10-CM | POA: Diagnosis not present

## 2019-05-23 DIAGNOSIS — N186 End stage renal disease: Secondary | ICD-10-CM | POA: Diagnosis not present

## 2019-05-23 DIAGNOSIS — D509 Iron deficiency anemia, unspecified: Secondary | ICD-10-CM | POA: Diagnosis not present

## 2019-05-23 DIAGNOSIS — D631 Anemia in chronic kidney disease: Secondary | ICD-10-CM | POA: Diagnosis not present

## 2019-05-23 DIAGNOSIS — N2581 Secondary hyperparathyroidism of renal origin: Secondary | ICD-10-CM | POA: Diagnosis not present

## 2019-05-23 DIAGNOSIS — J158 Pneumonia due to other specified bacteria: Secondary | ICD-10-CM | POA: Diagnosis not present

## 2019-05-23 DIAGNOSIS — Z992 Dependence on renal dialysis: Secondary | ICD-10-CM | POA: Diagnosis not present

## 2019-05-24 ENCOUNTER — Telehealth: Payer: Self-pay | Admitting: Gastroenterology

## 2019-05-24 NOTE — Telephone Encounter (Signed)
Spoke with pt and he is aware and will speak with the staff at the dialysis center in the am.

## 2019-05-24 NOTE — Telephone Encounter (Signed)
I recommend that he use a daily stool bulking agent such as Metamucil or Benefiber twice daily. He could add Miralax 17 grams daily on to that if needed  Please offer follow-up with me or APP. Thank you.

## 2019-05-24 NOTE — Telephone Encounter (Signed)
Pt reports the past several weeks he has only had a BM on the weekend. States Friday night it took him 45 min to have the BM and it was very large and hard, he had to take his fingers and break it up to pass all of it. He states Dr. Tarri Glenn mentioned some supplements but his dialysis facility wanted him to find out exactly what GI recommended he take to make sure it was ok kidney wise. He also wonders about a "bowel loosener." Please advise.

## 2019-05-25 DIAGNOSIS — J158 Pneumonia due to other specified bacteria: Secondary | ICD-10-CM | POA: Diagnosis not present

## 2019-05-25 DIAGNOSIS — N2581 Secondary hyperparathyroidism of renal origin: Secondary | ICD-10-CM | POA: Diagnosis not present

## 2019-05-25 DIAGNOSIS — Z992 Dependence on renal dialysis: Secondary | ICD-10-CM | POA: Diagnosis not present

## 2019-05-25 DIAGNOSIS — D509 Iron deficiency anemia, unspecified: Secondary | ICD-10-CM | POA: Diagnosis not present

## 2019-05-25 DIAGNOSIS — N186 End stage renal disease: Secondary | ICD-10-CM | POA: Diagnosis not present

## 2019-05-25 DIAGNOSIS — D631 Anemia in chronic kidney disease: Secondary | ICD-10-CM | POA: Diagnosis not present

## 2019-05-27 DIAGNOSIS — N2581 Secondary hyperparathyroidism of renal origin: Secondary | ICD-10-CM | POA: Diagnosis not present

## 2019-05-27 DIAGNOSIS — J158 Pneumonia due to other specified bacteria: Secondary | ICD-10-CM | POA: Diagnosis not present

## 2019-05-27 DIAGNOSIS — Z992 Dependence on renal dialysis: Secondary | ICD-10-CM | POA: Diagnosis not present

## 2019-05-27 DIAGNOSIS — N186 End stage renal disease: Secondary | ICD-10-CM | POA: Diagnosis not present

## 2019-05-27 DIAGNOSIS — D509 Iron deficiency anemia, unspecified: Secondary | ICD-10-CM | POA: Diagnosis not present

## 2019-05-27 DIAGNOSIS — D631 Anemia in chronic kidney disease: Secondary | ICD-10-CM | POA: Diagnosis not present

## 2019-05-30 DIAGNOSIS — N2581 Secondary hyperparathyroidism of renal origin: Secondary | ICD-10-CM | POA: Diagnosis not present

## 2019-05-30 DIAGNOSIS — J158 Pneumonia due to other specified bacteria: Secondary | ICD-10-CM | POA: Diagnosis not present

## 2019-05-30 DIAGNOSIS — D509 Iron deficiency anemia, unspecified: Secondary | ICD-10-CM | POA: Diagnosis not present

## 2019-05-30 DIAGNOSIS — N186 End stage renal disease: Secondary | ICD-10-CM | POA: Diagnosis not present

## 2019-05-30 DIAGNOSIS — Z992 Dependence on renal dialysis: Secondary | ICD-10-CM | POA: Diagnosis not present

## 2019-05-30 DIAGNOSIS — D631 Anemia in chronic kidney disease: Secondary | ICD-10-CM | POA: Diagnosis not present

## 2019-06-01 DIAGNOSIS — D509 Iron deficiency anemia, unspecified: Secondary | ICD-10-CM | POA: Diagnosis not present

## 2019-06-01 DIAGNOSIS — D631 Anemia in chronic kidney disease: Secondary | ICD-10-CM | POA: Diagnosis not present

## 2019-06-01 DIAGNOSIS — N2581 Secondary hyperparathyroidism of renal origin: Secondary | ICD-10-CM | POA: Diagnosis not present

## 2019-06-01 DIAGNOSIS — J158 Pneumonia due to other specified bacteria: Secondary | ICD-10-CM | POA: Diagnosis not present

## 2019-06-01 DIAGNOSIS — Z992 Dependence on renal dialysis: Secondary | ICD-10-CM | POA: Diagnosis not present

## 2019-06-01 DIAGNOSIS — N186 End stage renal disease: Secondary | ICD-10-CM | POA: Diagnosis not present

## 2019-06-03 DIAGNOSIS — N2581 Secondary hyperparathyroidism of renal origin: Secondary | ICD-10-CM | POA: Diagnosis not present

## 2019-06-03 DIAGNOSIS — D509 Iron deficiency anemia, unspecified: Secondary | ICD-10-CM | POA: Diagnosis not present

## 2019-06-03 DIAGNOSIS — J158 Pneumonia due to other specified bacteria: Secondary | ICD-10-CM | POA: Diagnosis not present

## 2019-06-03 DIAGNOSIS — Z992 Dependence on renal dialysis: Secondary | ICD-10-CM | POA: Diagnosis not present

## 2019-06-03 DIAGNOSIS — N186 End stage renal disease: Secondary | ICD-10-CM | POA: Diagnosis not present

## 2019-06-03 DIAGNOSIS — D631 Anemia in chronic kidney disease: Secondary | ICD-10-CM | POA: Diagnosis not present

## 2019-06-04 DIAGNOSIS — N186 End stage renal disease: Secondary | ICD-10-CM | POA: Diagnosis not present

## 2019-06-04 DIAGNOSIS — I12 Hypertensive chronic kidney disease with 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: Secondary | ICD-10-CM | POA: Diagnosis not present

## 2019-06-04 DIAGNOSIS — Z992 Dependence on renal dialysis: Secondary | ICD-10-CM | POA: Diagnosis not present

## 2019-06-06 DIAGNOSIS — Z992 Dependence on renal dialysis: Secondary | ICD-10-CM | POA: Diagnosis not present

## 2019-06-06 DIAGNOSIS — J158 Pneumonia due to other specified bacteria: Secondary | ICD-10-CM | POA: Diagnosis not present

## 2019-06-06 DIAGNOSIS — N186 End stage renal disease: Secondary | ICD-10-CM | POA: Diagnosis not present

## 2019-06-06 DIAGNOSIS — D631 Anemia in chronic kidney disease: Secondary | ICD-10-CM | POA: Diagnosis not present

## 2019-06-06 DIAGNOSIS — D509 Iron deficiency anemia, unspecified: Secondary | ICD-10-CM | POA: Diagnosis not present

## 2019-06-06 DIAGNOSIS — N2581 Secondary hyperparathyroidism of renal origin: Secondary | ICD-10-CM | POA: Diagnosis not present

## 2019-06-08 DIAGNOSIS — Z992 Dependence on renal dialysis: Secondary | ICD-10-CM | POA: Diagnosis not present

## 2019-06-08 DIAGNOSIS — D631 Anemia in chronic kidney disease: Secondary | ICD-10-CM | POA: Diagnosis not present

## 2019-06-08 DIAGNOSIS — J158 Pneumonia due to other specified bacteria: Secondary | ICD-10-CM | POA: Diagnosis not present

## 2019-06-08 DIAGNOSIS — N186 End stage renal disease: Secondary | ICD-10-CM | POA: Diagnosis not present

## 2019-06-08 DIAGNOSIS — D509 Iron deficiency anemia, unspecified: Secondary | ICD-10-CM | POA: Diagnosis not present

## 2019-06-08 DIAGNOSIS — N2581 Secondary hyperparathyroidism of renal origin: Secondary | ICD-10-CM | POA: Diagnosis not present

## 2019-06-10 DIAGNOSIS — N2581 Secondary hyperparathyroidism of renal origin: Secondary | ICD-10-CM | POA: Diagnosis not present

## 2019-06-10 DIAGNOSIS — Z992 Dependence on renal dialysis: Secondary | ICD-10-CM | POA: Diagnosis not present

## 2019-06-10 DIAGNOSIS — D631 Anemia in chronic kidney disease: Secondary | ICD-10-CM | POA: Diagnosis not present

## 2019-06-10 DIAGNOSIS — J158 Pneumonia due to other specified bacteria: Secondary | ICD-10-CM | POA: Diagnosis not present

## 2019-06-10 DIAGNOSIS — N186 End stage renal disease: Secondary | ICD-10-CM | POA: Diagnosis not present

## 2019-06-10 DIAGNOSIS — D509 Iron deficiency anemia, unspecified: Secondary | ICD-10-CM | POA: Diagnosis not present

## 2019-06-13 DIAGNOSIS — D509 Iron deficiency anemia, unspecified: Secondary | ICD-10-CM | POA: Diagnosis not present

## 2019-06-13 DIAGNOSIS — N186 End stage renal disease: Secondary | ICD-10-CM | POA: Diagnosis not present

## 2019-06-13 DIAGNOSIS — N2581 Secondary hyperparathyroidism of renal origin: Secondary | ICD-10-CM | POA: Diagnosis not present

## 2019-06-13 DIAGNOSIS — J158 Pneumonia due to other specified bacteria: Secondary | ICD-10-CM | POA: Diagnosis not present

## 2019-06-13 DIAGNOSIS — D631 Anemia in chronic kidney disease: Secondary | ICD-10-CM | POA: Diagnosis not present

## 2019-06-13 DIAGNOSIS — Z992 Dependence on renal dialysis: Secondary | ICD-10-CM | POA: Diagnosis not present

## 2019-06-14 ENCOUNTER — Encounter: Payer: Self-pay | Admitting: Cardiology

## 2019-06-14 ENCOUNTER — Other Ambulatory Visit: Payer: Self-pay

## 2019-06-14 ENCOUNTER — Ambulatory Visit (INDEPENDENT_AMBULATORY_CARE_PROVIDER_SITE_OTHER): Payer: Medicare Other | Admitting: Cardiology

## 2019-06-14 VITALS — BP 168/98 | HR 78 | Ht 65.0 in | Wt 186.4 lb

## 2019-06-14 DIAGNOSIS — I10 Essential (primary) hypertension: Secondary | ICD-10-CM

## 2019-06-14 DIAGNOSIS — I428 Other cardiomyopathies: Secondary | ICD-10-CM | POA: Diagnosis not present

## 2019-06-14 DIAGNOSIS — I110 Hypertensive heart disease with heart failure: Secondary | ICD-10-CM

## 2019-06-14 MED ORDER — ISOSORBIDE DINITRATE 20 MG PO TABS: 20.0000 mg | ORAL_TABLET | Freq: Two times a day (BID) | ORAL | 0 refills | Status: DC

## 2019-06-14 NOTE — Progress Notes (Signed)
Cardiology Office Note    Date:  06/14/2019   ID:  James Wall, DOB Feb 11, 1965, MRN [id number]  PCP:  Sherene Sires, DO  Cardiologist:  Ena Dawley, MD  Electrophysiologist:  None   Chief Complaint: Poorly controlled hypertension  History of Present Illness:   James Wall is a 54 y.o. male with history of HTN, NICM (EF 45-50% by echo 12/2017), severe pulmonary HTN by echo 12/2017, PSVT, PVCs, ESRD on HD for several years, liver cirrhosis, weight loss, anemia, arthritis, COPD, GERD, HLD, secondary hyperparathyroidism, OSA, who presents for f/u of blood pressure.   He has history of cardiomyopathy with echo going back to 2009 showing EF 45-50%. He previously saw Dr. Meda Coffee with exertional dyspnea and fatigue in the context of weight loss in 2015. EKG was abnormal. Nuclear stress test 06/2013 was intermediate risk with mid to distal anterior wall ischemia. He underwent cardiac cath 07/2013 showing normal coronary arteries. Holter at that time showed NSR to sinus tach and frequent PVCs. He also carries a history of PSVT. Last echocardiogram 12/2017 showed EF 45-50%, elevated LVEDP/LAFP, mild-moderate MR, moderate LAE, mild RAE, severe pulmonary HTN PASP 60mHg.  The patient has been having difficult time controlling his blood pressure, he was given a prescription for hydralazine 25 mg p.o. 3 times daily however when he takes the 1 at noon he feels very tired and nauseous.  He also complains of inability to sleep when his blood pressure is elevated.  He continues to have random episodes of nausea and vomiting.  He states that he never develops hypotension postdialysis.  He denies any lower extremity edema orthopnea or proximal nocturnal dyspnea.   Past Medical History:  Diagnosis Date  . Anemia   . Anxiety   . Arthritis    GOUT - pt not sure if this is true- not sure 11-30-18  . AV fistula occlusion ([facility] 10/2015  . Blood transfusion without reported diagnosis   .  Cirrhosis, nonalcoholic ([place]   . COPD (chronic obstructive pulmonary disease) ([place] [place]   . Depression   . [facility]    Started HD Jan 2009.  ESRD was due to HTN.  Dx'd with HTN in hospital 1996 according to pt, they had to keep him so he could get Medicaid to afford the BP medications.  First saw a nephrologist and started HD in the same year 2009.  [facility].  Does not have DM. He had a left RC AVF that never functioned, a left upper arm AVF that worked for about 5 years and as of June  . GERD (gastroesophageal reflux disease)   . Heart murmur    as a child per pt  . Hyperlipidemia   . Hypertension   . NICM (nonischemic cardiomyopathy) ([facility]   . PSVT (paroxysmal supraventricular tachycardia) ([facility]   . Pulmonary hypertension ([place]   . PVC's (premature ventricular contractions)   . Secondary hyperparathyroidism ([facility]   . Sepsis ([facility] 02/2013   from AVF , treated with Vancomycin.  . Sleep apnea    no  longer using cpap    Past Surgical History:  Procedure Laterality Date  . AV FISTULA PLACEMENT  2009   Left lower arm AVF  . AV FISTULA PLACEMENT Right 02/22/2013   Procedure:  CREATION  OF BRACHIAL CEPHALIC FISTULA RIGHT ARM;  Surgeon: CElam Dutch MD;  [facility]  Service: Vascular;  Laterality: Right;  . AV FISTULA PLACEMENT Left  08/10/2014   Procedure: BASILIC VEIN TRANSPOSITION  ARTERIOVENOUS (AV) FISTULA CREATION LEFT UPPER ARM;  Surgeon: Mal Misty, MD;  Location: [place];  Service: Vascular;  Laterality: Left;  . COLONOSCOPY    . ESOPHAGOGASTRODUODENOSCOPY (EGD) WITH PROPOFOL N/A 04/12/2013   Procedure: ESOPHAGOGASTRODUODENOSCOPY (EGD) WITH PROPOFOL;  Surgeon: Arta Silence, MD;  [facility];  Service: Endoscopy;  Laterality: N/A;  . INSERTION OF DIALYSIS CATHETER N/A 12/23/2012   Procedure: INSERTION OF DIALYSIS CATHETER; ULTRASOUND GUIDED;  Surgeon: Angelia Mould, MD;  Location: [facility];  Service: Vascular;   Laterality: N/A;  . INSERTION OF DIALYSIS CATHETER  10/22/2015   Right IJ non-tunneled HD catheter, placed again in 2019  . IR GENERIC HISTORICAL  10/22/2015   IR US GUIDE VASC ACCESS RIGHT 10/22/2015 [facility]  . IR GENERIC HISTORICAL  10/22/2015   IR FLUORO GUIDE CV LINE RIGHT 10/22/2015 [facility]  . IR GENERIC HISTORICAL  10/23/2015   IR FLUORO GUIDE CV LINE RIGHT 10/23/2015 Marybelle Killings, MD [facility]  . LEFT HEART CATHETERIZATION WITH CORONARY ANGIOGRAM N/A 07/13/2013   Procedure: LEFT HEART CATHETERIZATION WITH CORONARY ANGIOGRAM;  Surgeon: Jettie Booze, MD;  [facility] C[facility] LAB;  Service: Cardiovascular;  Laterality: N/A;  . LIGATION OF ARTERIOVENOUS  FISTULA Left 12/22/2012   Procedure: LIGATION OF ARTERIOVENOUS  FISTULA;EXCISION OF LARGE ANEURYSMS;;  Surgeon: Elam Dutch, MD;  Location: [facility];  Service: Vascular;  Laterality: Left;  . UPPER GASTROINTESTINAL ENDOSCOPY      Current Medications: Current Meds  Medication Sig  . acetaminophen (TYLENOL) 650 MG CR tablet Take 1 tablet (650 mg total) by mouth daily as needed for pain.  Marland Kitchen amLODipine (NORVASC) 10 MG tablet TAKE 1 TABLET(10 MG) BY MOUTH DAILY  . cloNIDine (CATAPRES - DOSED IN MG/24 HR) 0.3 mg/24hr patch Place 0.3 mg onto the skin once a week.   . Etelcalcetide HCl (PARSABIV IV) Etelcalcetide (Parsabiv)  . heparin 1000 unit/mL SOLN injection Heparin Sodium (Porcine) 1,000 Units/mL Catheter Lock Venous  . hydrALAZINE (APRESOLINE) 25 MG tablet Take 25 mg by mouth 2 (two) times daily.  . hydrOXYzine (ATARAX/VISTARIL) 25 MG tablet Take 1 tablet (25 mg total) by mouth every 8 (eight) hours as needed for itching. Further refills need to come from your PCP.  Marland Kitchen ketoconazole (NIZORAL) 2 % cream APP EXT AA BID  . metoprolol tartrate (LOPRESSOR) 100 MG tablet Take 1 tablet by mouth twice a day.  ON DIALYSIS DAYS, TAKE AFTER DIALYSIS.  Marland Kitchen pantoprazole (PROTONIX) 40 MG tablet Take 1 tablet (40 mg total) by mouth 2 (two)  times daily before a meal.  . sevelamer carbonate (RENVELA) 800 MG tablet Take 3,200-4,800 mg by mouth See admin instructions. Take 4-6 tablets (3200-4800 mg) by mouth one to two times daily with meals  . tamsulosin (FLOMAX) 0.4 MG CAPS capsule Take 0.4 mg by mouth daily.  Marland Kitchen VITAMIN D, CHOLECALCIFEROL, PO Take by mouth as directed. AT DIALYSIS      Allergies:   Venofer  [ferric oxide] and Aspirin   Social History   Socioeconomic History  . Marital status: Single    Spouse name: Not on file  . Number of children: 0  . Years of education: 12th  . Highest education level: Not on file  Occupational History  . Occupation: n/a  Tobacco Use  . Smoking status: Current Every Day Smoker    Packs/day: 0.25    Years: 16.00    Pack years: 4.00    Types: Cigarettes  Last attempt to quit: 03/03/2017    Years since quitting: 2.2  . Smokeless tobacco: Never Used  Substance and Sexual Activity  . Alcohol use: Yes    Alcohol/week: 0.0 standard drinks    Comment: rare  . Drug use: Yes    Frequency: 2.0 times per week    Types: Marijuana    Comment: last used 11-28-18  . Sexual activity: Not on file  Other Topics Concern  . Not on file  Social History Narrative  . Not on file   Social Determinants of Health   Financial Resource Strain:   . Difficulty of Paying Living Expenses:   Food Insecurity:   . Worried About Charity fundraiser in the Last Year:   . Arboriculturist in the Last Year:   Transportation Needs:   . Film/video editor (Medical):   Marland Kitchen Lack of Transportation (Non-Medical):   Physical Activity:   . Days of Exercise per Week:   . Minutes of Exercise per Session:   Stress:   . Feeling of Stress :   Social Connections:   . Frequency of Communication with Friends and Family:   . Frequency of Social Gatherings with Friends and Family:   . Attends Religious Services:   . Active Member of Clubs or Organizations:   . Attends Archivist Meetings:   Marland Kitchen  Marital Status:      Family History:  The patient's family history includes Asthma in his brother; Cerebrovascular Accident in his father; Congestive Heart Failure in his brother; Hypertension in his father and mother. There is no history of Stomach cancer, Rectal cancer, Esophageal cancer, or Colon cancer.  ROS:   Please see the history of present illness.  All other systems are reviewed and otherwise negative.    EKGs/Labs/Other Studies Reviewed:    Studies reviewed are outlined and summarized above. Reports included below if pertinent.  2D echo 12/2017 - Left ventricle: Septal and inferior basal hypokinesis. The cavity  size was mildly dilated. Wall thickness was normal. Systolic  function was mildly reduced. The estimated ejection fraction was  in the range of 45% to 50%. Doppler parameters are consistent  with both elevated ventricular end-diastolic filling pressure and  elevated left atrial filling pressure.  - Aortic valve: There was trivial regurgitation.  - Mitral valve: Moderately calcified annulus. Moderately thickened,  moderately calcified leaflets . There was mild to moderate  regurgitation.  - Left atrium: The atrium was moderately dilated.  - Right atrium: The atrium was mildly dilated.  - Atrial septum: No defect or patent foramen ovale was identified.  - Tricuspid valve: There was moderate regurgitation.  - Pulmonary arteries: PA peak pressure: 97 mm Hg (S).  - Impressions: Severe pulmonary hypertension based on TR velocity.   Impressions:   - Severe pulmonary hypertension based on TR velocity.     EKG:  EKG is not ordered today.  Recent Labs: 03/03/2019: Hemoglobin 10.2; Platelets 188; TSH 2.900  Recent Lipid Panel No results found for: CHOL, TRIG, HDL, CHOLHDL, VLDL, LDLCALC, LDLDIRECT  PHYSICAL EXAM:    VS:  BP (!) 168/98   Pulse 78   Ht 5' 5"  (1.651 m)   Wt 186 lb 6.4 oz (84.6 kg)   SpO2 97%   BMI 31.02 kg/m   BMI: Body mass  index is 31.02 kg/m.  GEN: Well nourished, well developed AAM, in no acute distress HEENT: normocephalic, atraumatic Neck: no JVD, carotid bruits, or masses Cardiac: RRR; no  murmurs, rubs, or gallops, no edema. Event monitor on chest wall Respiratory:  clear to auscultation bilaterally, normal work of breathing GI: soft, nontender, nondistended, + BS MS: no deformity or atrophy Skin: warm and dry, no rash Neuro:  Alert and Oriented x 3, Strength and sensation are intact, follows commands Psych: euthymic mood, full affect  Wt Readings from Last 3 Encounters:  06/14/19 186 lb 6.4 oz (84.6 kg)  03/15/19 200 lb (90.7 kg)  03/10/19 195 lb (88.5 kg)     ASSESSMENT & PLAN:   1. Hypertensive heart disease with CHF, goal to control hypertension, complicated by GI issues, the patient does not want to take hydralazine 3 times a day as it causes nausea and vomiting, I will add isosorbide dinitrate 20 mg twice daily in addition to hydralazine 25 mg p.o. twice daily I will continue amlodipine 10 mg daily metoprolol 100 mg daily and clonidine 0.3 mg daily.  We will follow him in our blood pressure clinic in 4 weeks and with me in 4 months. 2. Palpitations - monitor in progress. Continue BB. No recurrent palpitations recently.  3. H/o NICM with severe pulmonary HTN - PASP was markedly elevated in 2019 although he is not describing any current symptoms c/w pulmonary HTN or angina. We will re-evaluate with echocardiogram. Not on ACEI/ARB/Spiro due to his CKD. Volume managed with HD.  He appears euvolemic. 4. Mild-moderate mitral regurgitation - no symptoms to suggest clinical progression. We are obtaining echocardiogram for his pulmonary HTN at which time we can also get a better look at his valve structure.  Disposition: F/u with Dr. Meda Coffee or me in 4 months.  Follow-up with blood pressure clinic in 4 weeks..  Medication Adjustments/Labs and Tests Ordered: Current medicines are reviewed at length with  the patient today.  Concerns regarding medicines are outlined above. Medication changes, Labs and Tests ordered today are summarized above and listed in the Patient Instructions accessible in Encounters.   Signed, Ena Dawley, MD  06/14/2019 10:14 AM    [address], [place], [place]  [address] Phone: [phone]; Fax: [phone]

## 2019-06-14 NOTE — Patient Instructions (Signed)
Medication Instructions:   START TAKING ISOSORBIDE DINITRATE 20 MG BY MOUTH TWICE DAILY.  TAKE THIS THE SAME TIME YOU ADMINISTER YOUR HYDRALAZINE.  *If you need a refill on your cardiac medications before your next appointment, please call your pharmacy*   You have been referred to [place] [facility] 4 WEEKS--THIS IS IN OUR OFFICE.    Follow-Up:  4 WEEKS WITH BLOOD PRESSURE CLINIC HERE AT OUR OFFICE, TO SEE THE PHARMACIST.  4 MONTHS WITH DR. Benita Stabile ADD TO Thursday September 29, 2019 AT ANY SLOT ON DR. Francesca Oman SCHEDULE.  YOU CAN PUT OK PER DR. Meda Coffee

## 2019-06-15 DIAGNOSIS — D509 Iron deficiency anemia, unspecified: Secondary | ICD-10-CM | POA: Diagnosis not present

## 2019-06-15 DIAGNOSIS — Z992 Dependence on renal dialysis: Secondary | ICD-10-CM | POA: Diagnosis not present

## 2019-06-15 DIAGNOSIS — N2581 Secondary hyperparathyroidism of renal origin: Secondary | ICD-10-CM | POA: Diagnosis not present

## 2019-06-15 DIAGNOSIS — N186 End stage renal disease: Secondary | ICD-10-CM | POA: Diagnosis not present

## 2019-06-15 DIAGNOSIS — J158 Pneumonia due to other specified bacteria: Secondary | ICD-10-CM | POA: Diagnosis not present

## 2019-06-15 DIAGNOSIS — D631 Anemia in chronic kidney disease: Secondary | ICD-10-CM | POA: Diagnosis not present

## 2019-06-17 DIAGNOSIS — D509 Iron deficiency anemia, unspecified: Secondary | ICD-10-CM | POA: Diagnosis not present

## 2019-06-17 DIAGNOSIS — D631 Anemia in chronic kidney disease: Secondary | ICD-10-CM | POA: Diagnosis not present

## 2019-06-17 DIAGNOSIS — Z992 Dependence on renal dialysis: Secondary | ICD-10-CM | POA: Diagnosis not present

## 2019-06-17 DIAGNOSIS — J158 Pneumonia due to other specified bacteria: Secondary | ICD-10-CM | POA: Diagnosis not present

## 2019-06-17 DIAGNOSIS — N2581 Secondary hyperparathyroidism of renal origin: Secondary | ICD-10-CM | POA: Diagnosis not present

## 2019-06-17 DIAGNOSIS — N186 End stage renal disease: Secondary | ICD-10-CM | POA: Diagnosis not present

## 2019-06-20 DIAGNOSIS — D631 Anemia in chronic kidney disease: Secondary | ICD-10-CM | POA: Diagnosis not present

## 2019-06-20 DIAGNOSIS — N186 End stage renal disease: Secondary | ICD-10-CM | POA: Diagnosis not present

## 2019-06-20 DIAGNOSIS — N2581 Secondary hyperparathyroidism of renal origin: Secondary | ICD-10-CM | POA: Diagnosis not present

## 2019-06-20 DIAGNOSIS — D509 Iron deficiency anemia, unspecified: Secondary | ICD-10-CM | POA: Diagnosis not present

## 2019-06-20 DIAGNOSIS — J158 Pneumonia due to other specified bacteria: Secondary | ICD-10-CM | POA: Diagnosis not present

## 2019-06-20 DIAGNOSIS — Z992 Dependence on renal dialysis: Secondary | ICD-10-CM | POA: Diagnosis not present

## 2019-06-22 DIAGNOSIS — D631 Anemia in chronic kidney disease: Secondary | ICD-10-CM | POA: Diagnosis not present

## 2019-06-22 DIAGNOSIS — N2581 Secondary hyperparathyroidism of renal origin: Secondary | ICD-10-CM | POA: Diagnosis not present

## 2019-06-22 DIAGNOSIS — Z992 Dependence on renal dialysis: Secondary | ICD-10-CM | POA: Diagnosis not present

## 2019-06-22 DIAGNOSIS — N186 End stage renal disease: Secondary | ICD-10-CM | POA: Diagnosis not present

## 2019-06-22 DIAGNOSIS — J158 Pneumonia due to other specified bacteria: Secondary | ICD-10-CM | POA: Diagnosis not present

## 2019-06-22 DIAGNOSIS — D509 Iron deficiency anemia, unspecified: Secondary | ICD-10-CM | POA: Diagnosis not present

## 2019-06-24 DIAGNOSIS — N2581 Secondary hyperparathyroidism of renal origin: Secondary | ICD-10-CM | POA: Diagnosis not present

## 2019-06-24 DIAGNOSIS — J158 Pneumonia due to other specified bacteria: Secondary | ICD-10-CM | POA: Diagnosis not present

## 2019-06-24 DIAGNOSIS — Z992 Dependence on renal dialysis: Secondary | ICD-10-CM | POA: Diagnosis not present

## 2019-06-24 DIAGNOSIS — D631 Anemia in chronic kidney disease: Secondary | ICD-10-CM | POA: Diagnosis not present

## 2019-06-24 DIAGNOSIS — D509 Iron deficiency anemia, unspecified: Secondary | ICD-10-CM | POA: Diagnosis not present

## 2019-06-24 DIAGNOSIS — N186 End stage renal disease: Secondary | ICD-10-CM | POA: Diagnosis not present

## 2019-06-27 DIAGNOSIS — D509 Iron deficiency anemia, unspecified: Secondary | ICD-10-CM | POA: Diagnosis not present

## 2019-06-27 DIAGNOSIS — N2581 Secondary hyperparathyroidism of renal origin: Secondary | ICD-10-CM | POA: Diagnosis not present

## 2019-06-27 DIAGNOSIS — Z992 Dependence on renal dialysis: Secondary | ICD-10-CM | POA: Diagnosis not present

## 2019-06-27 DIAGNOSIS — N186 End stage renal disease: Secondary | ICD-10-CM | POA: Diagnosis not present

## 2019-06-27 DIAGNOSIS — J158 Pneumonia due to other specified bacteria: Secondary | ICD-10-CM | POA: Diagnosis not present

## 2019-06-27 DIAGNOSIS — D631 Anemia in chronic kidney disease: Secondary | ICD-10-CM | POA: Diagnosis not present

## 2019-06-29 DIAGNOSIS — D631 Anemia in chronic kidney disease: Secondary | ICD-10-CM | POA: Diagnosis not present

## 2019-06-29 DIAGNOSIS — D509 Iron deficiency anemia, unspecified: Secondary | ICD-10-CM | POA: Diagnosis not present

## 2019-06-29 DIAGNOSIS — Z992 Dependence on renal dialysis: Secondary | ICD-10-CM | POA: Diagnosis not present

## 2019-06-29 DIAGNOSIS — N186 End stage renal disease: Secondary | ICD-10-CM | POA: Diagnosis not present

## 2019-06-29 DIAGNOSIS — J158 Pneumonia due to other specified bacteria: Secondary | ICD-10-CM | POA: Diagnosis not present

## 2019-06-29 DIAGNOSIS — N2581 Secondary hyperparathyroidism of renal origin: Secondary | ICD-10-CM | POA: Diagnosis not present

## 2019-07-01 DIAGNOSIS — N2581 Secondary hyperparathyroidism of renal origin: Secondary | ICD-10-CM | POA: Diagnosis not present

## 2019-07-01 DIAGNOSIS — J158 Pneumonia due to other specified bacteria: Secondary | ICD-10-CM | POA: Diagnosis not present

## 2019-07-01 DIAGNOSIS — D509 Iron deficiency anemia, unspecified: Secondary | ICD-10-CM | POA: Diagnosis not present

## 2019-07-01 DIAGNOSIS — D631 Anemia in chronic kidney disease: Secondary | ICD-10-CM | POA: Diagnosis not present

## 2019-07-01 DIAGNOSIS — Z992 Dependence on renal dialysis: Secondary | ICD-10-CM | POA: Diagnosis not present

## 2019-07-01 DIAGNOSIS — N186 End stage renal disease: Secondary | ICD-10-CM | POA: Diagnosis not present

## 2019-07-04 DIAGNOSIS — D509 Iron deficiency anemia, unspecified: Secondary | ICD-10-CM | POA: Diagnosis not present

## 2019-07-04 DIAGNOSIS — D631 Anemia in chronic kidney disease: Secondary | ICD-10-CM | POA: Diagnosis not present

## 2019-07-04 DIAGNOSIS — N2581 Secondary hyperparathyroidism of renal origin: Secondary | ICD-10-CM | POA: Diagnosis not present

## 2019-07-04 DIAGNOSIS — N186 End stage renal disease: Secondary | ICD-10-CM | POA: Diagnosis not present

## 2019-07-04 DIAGNOSIS — J158 Pneumonia due to other specified bacteria: Secondary | ICD-10-CM | POA: Diagnosis not present

## 2019-07-04 DIAGNOSIS — Z992 Dependence on renal dialysis: Secondary | ICD-10-CM | POA: Diagnosis not present

## 2019-07-12 ENCOUNTER — Encounter: Payer: Self-pay | Admitting: *Deleted

## 2019-07-12 ENCOUNTER — Ambulatory Visit (INDEPENDENT_AMBULATORY_CARE_PROVIDER_SITE_OTHER): Payer: Medicare Other | Admitting: Pharmacist

## 2019-07-12 ENCOUNTER — Other Ambulatory Visit: Payer: Self-pay

## 2019-07-12 VITALS — BP 126/64 | HR 56

## 2019-07-12 DIAGNOSIS — I10 Essential (primary) hypertension: Secondary | ICD-10-CM | POA: Diagnosis not present

## 2019-07-12 NOTE — Progress Notes (Signed)
Patient ID: James Wall, male   DOB: [birth year], 54 y.o.   MRN: [id number] Patient dropped ZIO XT monitor off with Pharmacy staff.  Monitor mailed to [facility] with patient information in [facility] prepaid return service mailing box.

## 2019-07-12 NOTE — Progress Notes (Signed)
Patient ID: James Wall                 DOB: 20-Apr-1965                      MRN: [id number]     HPI: Irbin Fines is a 54 y.o. male referred by Dr. Meda Coffee to HTN clinic. PMH is significant for HTN, NICM (EF 45-50% by echo 12/2017), severe pulmonary HTN by echo 12/2017, PCVs, ESRD on HD since 2009, cirrhosis, anemia, arthritis, COPD, GERD, HLD, secondary hyperparathyroidism, and OSA. Pt has had difficulty controlling his BP. His BP was elevated at 168/98 on 06/14/19. He was started on isosorbide dinitrate 69m BID and presents today for follow up.  Pt reports feeling fatigued and nauseous after his 3rd dose of hydralazine. Improved on BID dosing. Changes his clonidine patch once weekly. Appetite is minimal. Denies post-dialysis hypotension and LEE. Reports BP at the beginning of dialysis is 170-180/100, improves to 140-150/100 at the end of his sessions. Does not monitor his BP at home. Denies dizziness, falls, headache, or vision changes. Took his AM meds 1.5 hours ago. Not specific on timing of BID doses. A bit frustrated with his dialysis team.  Current HTN meds: amlodipine 171mdaily, clonidine patch 0.22m522mnce weekly, hydralazine 74m14mD, isosorbide dinitrate 20mg49m, metoprolol tartrate 100mg 322mPreviously tried: hydralazine TID - fatigue and nausea after 2nd dose BP goal: <130/80mmHg31mmily History: The patient's family history includes Asthma in his brother; Cerebrovascular Accident in his father; Congestive Heart Failure in his brother; Hypertension in his father and mother. There is no history of Stomach cancer, Rectal cancer, Esophageal cancer, or Colon cancer.  Social History: Smokes 1/4 PPD, alcohol and marijuana use.  Diet: States there are some days he doesn't eat at all. If he does, likes yogurt, apple, granola. Drinks Sprite or diet mountain dew (twice a day). No salt added to foods  Exercise: Stays up with housework  Home BP readings: Has not been  checking  Wt Readings from Last 3 Encounters:  06/14/19 186 lb 6.4 oz (84.6 kg)  03/15/19 200 lb (90.7 kg)  03/10/19 195 lb (88.5 kg)   BP Readings from Last 3 Encounters:  06/14/19 (!) 168/98  03/15/19 (!) 158/90  03/10/19 132/82   Pulse Readings from Last 3 Encounters:  06/14/19 78  03/15/19 61  03/10/19 60    Renal function: CrCl cannot be calculated (Patient's most recent lab result is older than the maximum 21 days allowed.).  Past Medical History:  Diagnosis Date  . Anemia   . Anxiety   . Arthritis    GOUT - pt not sure if this is true- not sure 11-30-18  . AV fistula occlusion (HCC) [id number]  . Blood transfusion without reported diagnosis   . Cirrhosis, nonalcoholic (HCC)   [place] (chronic obstructive pulmonary disease[place] (HCC)   [place]   . ESRD on hemodialysis (H[facility])  [facility]  [facility] due [facility].  Dx'd with HTN in hospital 1996 according to pt, they had to keep him so he could get Medicaid to afford the BP medications.  First saw a nephrologist and started HD in the same year 2009.  [facility]  Does not have DM. He had a left RC AVF that never functioned, a left upper arm AVF that worked for about 5 years and as of June  .  GERD (gastroesophageal reflux disease)   . Heart murmur    as a child per pt  . Hyperlipidemia   . Hypertension   . NICM (nonischemic cardiomyopathy) ([place])   . PSVT (paroxysmal supraventricular tachycardia) ([place])   . Pulmonary hypertension ([place])   . PVC's (premature ventricular contractions)   . Secondary hyperparathyroidism ([place])   . Sepsis ([place]) 02/2013   from AVF , treated with Vancomycin.  . Sleep apnea    no  longer using cpap    Current Outpatient Medications on File Prior to Visit  Medication Sig Dispense Refill  . acetaminophen (TYLENOL) 650 MG CR tablet Take 1 tablet (650 mg total) by mouth daily as needed for pain.    Marland Kitchen amLODipine (NORVASC) 10 MG tablet TAKE 1 TABLET(10 MG) BY MOUTH  DAILY 90 tablet 0  . cloNIDine (CATAPRES - DOSED IN MG/24 HR) 0.3 mg/24hr patch Place 0.3 mg onto the skin once a week.     . Etelcalcetide HCl (PARSABIV IV) Etelcalcetide (Parsabiv)    . heparin 1000 unit/mL SOLN injection Heparin Sodium (Porcine) 1,000 Units/mL Catheter Lock Venous    . hydrALAZINE (APRESOLINE) 25 MG tablet Take 25 mg by mouth 2 (two) times daily.    . hydrOXYzine (ATARAX/VISTARIL) 25 MG tablet Take 1 tablet (25 mg total) by mouth every 8 (eight) hours as needed for itching. Further refills need to come from your PCP. 30 tablet 3  . isosorbide dinitrate (ISORDIL) 20 MG tablet Take 1 tablet (20 mg total) by mouth 2 (two) times daily. 180 tablet 0  . ketoconazole (NIZORAL) 2 % cream APP EXT AA BID    . metoprolol tartrate (LOPRESSOR) 100 MG tablet Take 1 tablet by mouth twice a day.  ON DIALYSIS DAYS, TAKE AFTER DIALYSIS. 180 tablet 3  . pantoprazole (PROTONIX) 40 MG tablet Take 1 tablet (40 mg total) by mouth 2 (two) times daily before a meal. 180 tablet 3  . sevelamer carbonate (RENVELA) 800 MG tablet Take 3,200-4,800 mg by mouth See admin instructions. Take 4-6 tablets (3200-4800 mg) by mouth one to two times daily with meals    . tamsulosin (FLOMAX) 0.4 MG CAPS capsule Take 0.4 mg by mouth daily.  0  . VITAMIN D, CHOLECALCIFEROL, PO Take by mouth as directed. AT DIALYSIS     No current facility-administered medications on file prior to visit.    Allergies  Allergen Reactions  . Venofer  [Ferric Oxide] Itching  . Aspirin Other (See Comments)    Made congestion worse     Assessment/Plan:  1. Hypertension - BP checked twice in clinic today, at goal <130/44mHg both times. Will continue current meds including amlodipine 16mdaily, clonidine patch 0.39m61mnce weekly, hydralazine 93m17mD, isosorbide dinitrate 20mg26m, and metoprolol tartrate 100mg 64m Discussed staying active and limiting daily sodium intake. Follow up in HTN clinic as needed.  Kc Sedlak E. Tymeir Weathington, PharmD,  BCACP, CPP [id number] [id number]: [phone] 8:52 AM

## 2019-07-12 NOTE — Patient Instructions (Signed)
It was nice to meet you today  Your blood pressure goal is < 130/30mHg  Continue taking your current medications  Call clinic with any concerns #[phone]

## 2019-07-18 NOTE — Progress Notes (Signed)
Synopsis: ?COPD  Assessment & Plan:  Problem 1 COPD vs. asthma: Spirometry 2015 showing ratio 0.55, FEV1 1.63L (58% pred).  Mild emphysema on CT 2019. Problem 2 Smoker: quit two weeks ago, congratulated on this, not a candidate for lung cancer screening due to comorbidities Problem 3 Pulmonary HTN: in context of hypertensive cardiomyopathy, ESRD, OSA, and emphysema.  RHC could be considered if he was more symptomatic. ESRD, Vascular access issues, Cirrhosis, NICM, OSA, HLD  - Check pulmonary functions tests  - Smoking abstinence and exercise encouraged - follow up after lung function tests to review this test and smoking cessation progress then can be PRN f/u  MDM . I reviewed prior external note(s) from Dr. Meda Coffee on 06/14/19 . I reviewed the result(s) of spirometry from 2015, prior eos counts vary 100-500 . I have ordered full PFTs  Review of patient's CXR Nov 2019 images revealed pulmonary edema. The patient's images have been independently reviewed by me.      End of visit medications:  Current Outpatient Medications:  .  acetaminophen (TYLENOL) 650 MG CR tablet, Take 1 tablet (650 mg total) by mouth daily as needed for pain., Disp: , Rfl:  .  amLODipine (NORVASC) 10 MG tablet, TAKE 1 TABLET(10 MG) BY MOUTH DAILY, Disp: 90 tablet, Rfl: 0 .  cloNIDine (CATAPRES - DOSED IN MG/24 HR) 0.3 mg/24hr patch, Place 0.3 mg onto the skin once a week. , Disp: , Rfl:  .  Etelcalcetide HCl (PARSABIV IV), Etelcalcetide (Parsabiv), Disp: , Rfl:  .  heparin 1000 unit/mL SOLN injection, Heparin Sodium (Porcine) 1,000 Units/mL Catheter Lock Venous, Disp: , Rfl:  .  hydrALAZINE (APRESOLINE) 25 MG tablet, Take 25 mg by mouth 2 (two) times daily., Disp: , Rfl:  .  hydrOXYzine (ATARAX/VISTARIL) 25 MG tablet, Take 1 tablet (25 mg total) by mouth every 8 (eight) hours as needed for itching. Further refills need to come from your PCP., Disp: 30 tablet, Rfl: 3 .  isosorbide dinitrate (ISORDIL) 20 MG  tablet, Take 1 tablet (20 mg total) by mouth 2 (two) times daily., Disp: 180 tablet, Rfl: 0 .  ketoconazole (NIZORAL) 2 % cream, APP EXT AA BID, Disp: , Rfl:  .  metoprolol tartrate (LOPRESSOR) 100 MG tablet, Take 1 tablet by mouth twice a day.  ON DIALYSIS DAYS, TAKE AFTER DIALYSIS., Disp: 180 tablet, Rfl: 3 .  pantoprazole (PROTONIX) 40 MG tablet, Take 1 tablet (40 mg total) by mouth 2 (two) times daily before a meal., Disp: 180 tablet, Rfl: 3 .  sevelamer carbonate (RENVELA) 800 MG tablet, Take 3,200-4,800 mg by mouth See admin instructions. Take 4-6 tablets (3200-4800 mg) by mouth one to two times daily with meals, Disp: , Rfl:  .  tamsulosin (FLOMAX) 0.4 MG CAPS capsule, Take 0.4 mg by mouth daily., Disp: , Rfl: 0 .  VITAMIN D, CHOLECALCIFEROL, PO, Take by mouth as directed. AT DIALYSIS, Disp: , Rfl:    Candee Furbish, MD [facility] 07/19/2019 9:12 AM    Subjective:   PATIENT ID: James Wall GENDER: male DOB: Nov 17, 1965, MRN: [id number]  Chief Complaint  Patient presents with  . Consult    Referred by Dr. Justin Mend for COPD. Did not find he has COPD until he was rejected for a kidney transplant. Denies using any inhalers or O2.     HPI Here for possible COPD. Carries diagnosis of COPD which he was not aware of. Recently had kidney transplant eval and was told not a candidate  due to his COPD. He comes here to determine if he has COPD. Baseline MMRC 1 dyspnea.  Walks multiple blocks per day. On no inhalers, on no home O2.  Denies coughing/wheezing on exertion. Has been on dialysis for 12 years.  Biggest issue now is that HD wipes him out and he is too fatigued on HD days to do much but sleep. He also has had multiple vascular access issues.  All of his prior fistulas have failed.  He has a tunneled RIJ for past 5 years but has been told that once it fails he has no further options other than femoral or lumbar access which he is not interested in pursuing. 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  Ancillary information including prior medications, full medical/surgical/family/social histoies, and PFTs (when available) are listed below and have been reviewed.   ROS + symptoms in bold Fevers, chills, weight loss Nausea, vomiting, diarrhea Shortness of breath, wheezing, cough Chest pain, palpitations, lower ext edema   Objective:   Vitals:   07/19/19 0852  BP: 128/86  Pulse: 61  Temp: 98.2 F (36.8 C)  TempSrc: Oral  SpO2: 98%  Weight: 186 lb 12.8 oz (84.7 kg)  Height: 5' 5"  (1.651 m)   98% on RA BMI Readings from Last 3 Encounters:  07/19/19 31.09 kg/m  06/14/19 31.02 kg/m  03/15/19 33.28 kg/m   Wt Readings from Last 3 Encounters:  07/19/19 186 lb 12.8 oz (84.7 kg)  06/14/19 186 lb 6.4 oz (84.6 kg)  03/15/19 200 lb (90.7 kg)    GEN: 54 year old man in no acute distress HEENT: trachea midline, mucus membranes moist CV: Regular rate and rhythm, extremities are warm PULM: Diminished bilaterally without wheezing or accessory muscle use GI: Soft, +BS EXT: tunneled HD line on R subclavicular fossa CDI, has mild chronic venous stasis changes NEURO: Moves all 4 extremities and ambulates normally Skin: multiple fistula scars   Ancillary Information   Echo - Left ventricle: Septal and inferior basal hypokinesis. The cavity  size was mildly dilated. Wall thickness was normal. Systolic  function was mildly reduced. The estimated ejection fraction was  in the range of 45% to 50%. Doppler parameters are consistent  with both elevated ventricular end-diastolic filling pressure and  elevated left atrial filling pressure.  - Aortic valve: There was trivial regurgitation.  - Mitral valve: Moderately calcified annulus. Moderately thickened,  moderately calcified leaflets . There was mild to moderate  regurgitation.  - Left atrium: The atrium was moderately dilated.  - Right  atrium: The atrium was mildly dilated.  - Atrial septum: No defect or patent foramen ovale was identified.  - Tricuspid valve: There was moderate regurgitation.  - Pulmonary arteries: PA peak pressure: 97 mm Hg (S).  - Impressions: Severe pulmonary hypertension based on TR velocity.   Past Medical History:  Diagnosis Date  . Anemia   . Anxiety   . Arthritis    GOUT - pt not sure if this is true- not sure 11-30-18  . AV fistula occlusion ([place]) 10/2015  . Blood transfusion without reported diagnosis   . Cirrhosis, nonalcoholic ([place])   . COPD (chronic obstructive pulmonary disease) ([place])   . Depression   . ESRD on hemodialysis [facility])    Started HD Jan 2009.  ESRD was due to HTN.  Dx'd with HTN in hospital 1996 according to pt, they had to keep him so he could get Medicaid to afford  the BP medications.  First saw a nephrologist and started HD in the same year 2009.  G[facility] HD at [facility] on a MWF schedule.  Does not have DM. He had a left RC AVF that never functioned, a left upper arm AVF that worked for about 5 years and as of June  . GERD (gastroesophageal reflux disease)   . Heart murmur    as a child per pt  . Hyperlipidemia   . Hypertension   . NICM (nonischemic cardiomyopathy) ([place])   . PSVT (paroxysmal supraventricular tachycardia) ([place])   . Pulmonary hypertension ([place])   . PVC's (premature ventricular contractions)   . Secondary hyperparathyroidism ([place])   . Sepsis ([place]) 02/2013   from AVF , treated with Vancomycin.  . Sleep apnea    no  longer using cpap     Family History  Problem Relation Age of Onset  . Hypertension Mother   . Cerebrovascular Accident Father   . Hypertension Father   . Congestive Heart Failure Brother   . Asthma Brother   . Stomach cancer Neg Hx   . Rectal cancer Neg Hx   . Esophageal cancer Neg Hx   . Colon cancer Neg Hx      Past Surgical History:  Procedure Laterality Date  . AV FISTULA PLACEMENT  2009   Left lower arm AVF  . AV FISTULA  PLACEMENT Right 02/22/2013   Procedure:  CREATION  OF BRACHIAL CEPHALIC FISTULA RIGHT ARM;  Surgeon: Elam Dutch, MD;  Location: [place];  Service: Vascular;  Laterality: Right;  . AV FISTULA PLACEMENT Left 08/10/2014   Procedure: BASILIC VEIN TRANSPOSITION  ARTERIOVENOUS (AV) FISTULA CREATION LEFT UPPER ARM;  Surgeon: Mal Misty, MD;  Location: [place];  Service: Vascular;  Laterality: Left;  . COLONOSCOPY    . ESOPHAGOGASTRODUODENOSCOPY (EGD) WITH PROPOFOL N/A 04/12/2013   Procedure: ESOPHAGOGASTRODUODENOSCOPY (EGD) WITH PROPOFOL;  Surgeon: Arta Silence, MD;  Location: [facility];  Service: Endoscopy;  Laterality: N/A;  . INSERTION OF DIALYSIS CATHETER N/A 12/23/2012   Procedure: INSERTION OF DIALYSIS CATHETER; ULTRASOUND GUIDED;  Surgeon: Angelia Mould, MD;  Location: [place] [facility];  Service: Vascular;  Laterality: N/A;  . INSERTION OF DIALYSIS CATHETER  10/22/2015   Right IJ non-tunneled HD catheter, placed again in 2019  . IR GENERIC HISTORICAL  10/22/2015   IR US GUIDE VASC ACCESS RIGHT 10/22/2015 [facility]  . IR GENERIC HISTORICAL  10/22/2015   IR FLUORO GUIDE CV LINE RIGHT 10/22/2015 [facility]  . IR GENERIC HISTORICAL  10/23/2015   IR FLUORO GUIDE CV LINE RIGHT 10/23/2015 Marybelle Killings, MD [facility]  . LEFT HEART CATHETERIZATION WITH CORONARY ANGIOGRAM N/A 07/13/2013   Procedure: LEFT HEART CATHETERIZATION WITH CORONARY ANGIOGRAM;  Surgeon: Jettie Booze, MD;  Location: [facility] CATH LAB;  Service: Cardiovascular;  Laterality: N/A;  . LIGATION OF ARTERIOVENOUS  FISTULA Left 12/22/2012   Procedure: LIGATION OF ARTERIOVENOUS  FISTULA;EXCISION OF LARGE ANEURYSMS;;  Surgeon: Elam Dutch, MD;  Location: [place];  Service: Vascular;  Laterality: Left;  . UPPER GASTROINTESTINAL ENDOSCOPY      Social History   Socioeconomic History  . Marital status: Single    Spouse name: Not on file  . Number of children: 0  . Years of education: 12th  . Highest education level: Not on  file  Occupational History  . Occupation: n/a  Tobacco Use  . Smoking status: Former Smoker    Packs/day: 0.25    Years: 16.00  Pack years: 4.00    Types: Cigarettes    Quit date: 07/05/2019    Years since quitting: 0.0  . Smokeless tobacco: Never Used  Vaping Use  . Vaping Use: Never used  Substance and Sexual Activity  . Alcohol use: Yes    Alcohol/week: 0.0 standard drinks    Comment: rare  . Drug use: Yes    Frequency: 2.0 times per week    Types: Marijuana    Comment: last used 11-28-18  . Sexual activity: Not on file  Other Topics Concern  . Not on file  Social History Narrative  . Not on file   Social Determinants of Health   Financial Resource Strain:   . Difficulty of Paying Living Expenses:   Food Insecurity:   . Worried About Charity fundraiser in the Last Year:   . Arboriculturist in the Last Year:   Transportation Needs:   . Film/video editor (Medical):   Marland Kitchen Lack of Transportation (Non-Medical):   Physical Activity:   . Days of Exercise per Week:   . Minutes of Exercise per Session:   Stress:   . Feeling of Stress :   Social Connections:   . Frequency of Communication with Friends and Family:   . Frequency of Social Gatherings with Friends and Family:   . Attends Religious Services:   . Active Member of Clubs or Organizations:   . Attends Archivist Meetings:   Marland Kitchen Marital Status:   Intimate Partner Violence:   . Fear of Current or Ex-Partner:   . Emotionally Abused:   Marland Kitchen Physically Abused:   . Sexually Abused:      Allergies  Allergen Reactions  . Venofer  [Ferric Oxide] Itching  . Aspirin Other (See Comments)    Made congestion worse     CBC    Component Value Date/Time   WBC 8.7 03/03/2019 1057   WBC 4.6 12/25/2017 1243   RBC 3.62 (L) 03/03/2019 1057   RBC 3.21 (L) 12/25/2017 1243   HGB 10.2 (L) 03/03/2019 1057   HCT 30.9 (L) 03/03/2019 1057   PLT 188 03/03/2019 1057   MCV 85 03/03/2019 1057   MCH 28.2 03/03/2019  1057   MCH 26.8 12/25/2017 1243   MCHC 33.0 03/03/2019 1057   MCHC 29.4 (L) 12/25/2017 1243   RDW 16.9 (H) 03/03/2019 1057   LYMPHSABS 1.2 12/22/2017 1409   MONOABS 0.4 12/22/2017 1409   EOSABS 0.1 12/22/2017 1409   BASOSABS 0.0 12/22/2017 1409    Pulmonary Functions Testing Results: PFT Results Latest Ref Rng & Units 12/13/2013  FVC-Pre L 3.66  FVC-Predicted Pre % 105  Pre FEV1/FVC % % 44  FEV1-Pre L 1.63  FEV1-Predicted Pre % 58  DLCO UNC% % 56  DLCO COR %Predicted % 67    Outpatient Medications Prior to Visit  Medication Sig Dispense Refill  . acetaminophen (TYLENOL) 650 MG CR tablet Take 1 tablet (650 mg total) by mouth daily as needed for pain.    Marland Kitchen amLODipine (NORVASC) 10 MG tablet TAKE 1 TABLET(10 MG) BY MOUTH DAILY 90 tablet 0  . cloNIDine (CATAPRES - DOSED IN MG/24 HR) 0.3 mg/24hr patch Place 0.3 mg onto the skin once a week.     . Etelcalcetide HCl (PARSABIV IV) Etelcalcetide (Parsabiv)    . heparin 1000 unit/mL SOLN injection Heparin Sodium (Porcine) 1,000 Units/mL Catheter Lock Venous    . hydrALAZINE (APRESOLINE) 25 MG tablet Take 25 mg by mouth 2 (two)  times daily.    . hydrOXYzine (ATARAX/VISTARIL) 25 MG tablet Take 1 tablet (25 mg total) by mouth every 8 (eight) hours as needed for itching. Further refills need to come from your PCP. 30 tablet 3  . isosorbide dinitrate (ISORDIL) 20 MG tablet Take 1 tablet (20 mg total) by mouth 2 (two) times daily. 180 tablet 0  . ketoconazole (NIZORAL) 2 % cream APP EXT AA BID    . metoprolol tartrate (LOPRESSOR) 100 MG tablet Take 1 tablet by mouth twice a day.  ON DIALYSIS DAYS, TAKE AFTER DIALYSIS. 180 tablet 3  . pantoprazole (PROTONIX) 40 MG tablet Take 1 tablet (40 mg total) by mouth 2 (two) times daily before a meal. 180 tablet 3  . sevelamer carbonate (RENVELA) 800 MG tablet Take 3,200-4,800 mg by mouth See admin instructions. Take 4-6 tablets (3200-4800 mg) by mouth one to two times daily with meals    . tamsulosin  (FLOMAX) 0.4 MG CAPS capsule Take 0.4 mg by mouth daily.  0  . VITAMIN D, CHOLECALCIFEROL, PO Take by mouth as directed. AT DIALYSIS     No facility-administered medications prior to visit.

## 2019-07-19 ENCOUNTER — Ambulatory Visit (INDEPENDENT_AMBULATORY_CARE_PROVIDER_SITE_OTHER): Payer: Medicare Other | Admitting: Internal Medicine

## 2019-07-19 ENCOUNTER — Other Ambulatory Visit: Payer: Self-pay

## 2019-07-19 ENCOUNTER — Encounter: Payer: Self-pay | Admitting: Internal Medicine

## 2019-07-19 VITALS — BP 128/86 | HR 61 | Temp 98.2°F | Ht 65.0 in | Wt 186.8 lb

## 2019-07-19 DIAGNOSIS — J449 Chronic obstructive pulmonary disease, unspecified: Secondary | ICD-10-CM | POA: Diagnosis not present

## 2019-07-19 DIAGNOSIS — R0602 Shortness of breath: Secondary | ICD-10-CM

## 2019-07-19 NOTE — Patient Instructions (Signed)
Full lung function tests  Congratulations on stopping smoking!  Continue to walk as tolerated  Call if your breathing gets worse.  Follow up with one of our Nps after the lung function tests to review your numbers and smoking abstinence.

## 2019-08-01 ENCOUNTER — Other Ambulatory Visit: Payer: Self-pay | Admitting: *Deleted

## 2019-08-01 ENCOUNTER — Telehealth: Payer: Self-pay | Admitting: Cardiology

## 2019-08-01 DIAGNOSIS — R002 Palpitations: Secondary | ICD-10-CM

## 2019-08-01 DIAGNOSIS — I272 Pulmonary hypertension, unspecified: Secondary | ICD-10-CM

## 2019-08-01 DIAGNOSIS — I5042 Chronic combined systolic (congestive) and diastolic (congestive) heart failure: Secondary | ICD-10-CM

## 2019-08-01 NOTE — Telephone Encounter (Signed)
° ° °  caller looking for James Wall, while asking what's in regards to. She hang up

## 2019-08-03 ENCOUNTER — Telehealth: Payer: Self-pay | Admitting: Cardiology

## 2019-08-03 NOTE — Telephone Encounter (Signed)
See monitor results

## 2019-08-03 NOTE — Telephone Encounter (Signed)
Follow Up:    Pt is returning Carol's call, concerning his Monitor results.

## 2019-08-04 DIAGNOSIS — N186 End stage renal disease: Secondary | ICD-10-CM | POA: Diagnosis not present

## 2019-08-04 DIAGNOSIS — I12 Hypertensive chronic kidney disease with 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: Secondary | ICD-10-CM | POA: Diagnosis not present

## 2019-08-04 DIAGNOSIS — Z992 Dependence on renal dialysis: Secondary | ICD-10-CM | POA: Diagnosis not present

## 2019-08-05 DIAGNOSIS — N2581 Secondary hyperparathyroidism of renal origin: Secondary | ICD-10-CM | POA: Diagnosis not present

## 2019-08-05 DIAGNOSIS — N186 End stage renal disease: Secondary | ICD-10-CM | POA: Diagnosis not present

## 2019-08-05 DIAGNOSIS — J158 Pneumonia due to other specified bacteria: Secondary | ICD-10-CM | POA: Diagnosis not present

## 2019-08-05 DIAGNOSIS — Z992 Dependence on renal dialysis: Secondary | ICD-10-CM | POA: Diagnosis not present

## 2019-08-05 DIAGNOSIS — D631 Anemia in chronic kidney disease: Secondary | ICD-10-CM | POA: Diagnosis not present

## 2019-08-05 DIAGNOSIS — D509 Iron deficiency anemia, unspecified: Secondary | ICD-10-CM | POA: Diagnosis not present

## 2019-08-05 DIAGNOSIS — A499 Bacterial infection, unspecified: Secondary | ICD-10-CM | POA: Diagnosis not present

## 2019-08-08 DIAGNOSIS — D509 Iron deficiency anemia, unspecified: Secondary | ICD-10-CM | POA: Diagnosis not present

## 2019-08-08 DIAGNOSIS — Z992 Dependence on renal dialysis: Secondary | ICD-10-CM | POA: Diagnosis not present

## 2019-08-08 DIAGNOSIS — N2581 Secondary hyperparathyroidism of renal origin: Secondary | ICD-10-CM | POA: Diagnosis not present

## 2019-08-08 DIAGNOSIS — N186 End stage renal disease: Secondary | ICD-10-CM | POA: Diagnosis not present

## 2019-08-08 DIAGNOSIS — J158 Pneumonia due to other specified bacteria: Secondary | ICD-10-CM | POA: Diagnosis not present

## 2019-08-08 DIAGNOSIS — D631 Anemia in chronic kidney disease: Secondary | ICD-10-CM | POA: Diagnosis not present

## 2019-08-10 DIAGNOSIS — D631 Anemia in chronic kidney disease: Secondary | ICD-10-CM | POA: Diagnosis not present

## 2019-08-10 DIAGNOSIS — N2581 Secondary hyperparathyroidism of renal origin: Secondary | ICD-10-CM | POA: Diagnosis not present

## 2019-08-10 DIAGNOSIS — N186 End stage renal disease: Secondary | ICD-10-CM | POA: Diagnosis not present

## 2019-08-10 DIAGNOSIS — J158 Pneumonia due to other specified bacteria: Secondary | ICD-10-CM | POA: Diagnosis not present

## 2019-08-10 DIAGNOSIS — D509 Iron deficiency anemia, unspecified: Secondary | ICD-10-CM | POA: Diagnosis not present

## 2019-08-10 DIAGNOSIS — A499 Bacterial infection, unspecified: Secondary | ICD-10-CM | POA: Diagnosis not present

## 2019-08-10 DIAGNOSIS — Z992 Dependence on renal dialysis: Secondary | ICD-10-CM | POA: Diagnosis not present

## 2019-08-15 DIAGNOSIS — N2581 Secondary hyperparathyroidism of renal origin: Secondary | ICD-10-CM | POA: Diagnosis not present

## 2019-08-15 DIAGNOSIS — D631 Anemia in chronic kidney disease: Secondary | ICD-10-CM | POA: Diagnosis not present

## 2019-08-15 DIAGNOSIS — J158 Pneumonia due to other specified bacteria: Secondary | ICD-10-CM | POA: Diagnosis not present

## 2019-08-15 DIAGNOSIS — D509 Iron deficiency anemia, unspecified: Secondary | ICD-10-CM | POA: Diagnosis not present

## 2019-08-15 DIAGNOSIS — N186 End stage renal disease: Secondary | ICD-10-CM | POA: Diagnosis not present

## 2019-08-15 DIAGNOSIS — Z992 Dependence on renal dialysis: Secondary | ICD-10-CM | POA: Diagnosis not present

## 2019-08-16 ENCOUNTER — Other Ambulatory Visit (HOSPITAL_COMMUNITY): Payer: Self-pay | Admitting: Nephrology

## 2019-08-16 DIAGNOSIS — N186 End stage renal disease: Secondary | ICD-10-CM

## 2019-08-17 DIAGNOSIS — D509 Iron deficiency anemia, unspecified: Secondary | ICD-10-CM | POA: Diagnosis not present

## 2019-08-17 DIAGNOSIS — N2581 Secondary hyperparathyroidism of renal origin: Secondary | ICD-10-CM | POA: Diagnosis not present

## 2019-08-17 DIAGNOSIS — J158 Pneumonia due to other specified bacteria: Secondary | ICD-10-CM | POA: Diagnosis not present

## 2019-08-17 DIAGNOSIS — D631 Anemia in chronic kidney disease: Secondary | ICD-10-CM | POA: Diagnosis not present

## 2019-08-17 DIAGNOSIS — Z992 Dependence on renal dialysis: Secondary | ICD-10-CM | POA: Diagnosis not present

## 2019-08-17 DIAGNOSIS — N186 End stage renal disease: Secondary | ICD-10-CM | POA: Diagnosis not present

## 2019-08-18 ENCOUNTER — Other Ambulatory Visit: Payer: Self-pay

## 2019-08-18 ENCOUNTER — Ambulatory Visit (HOSPITAL_COMMUNITY)
Admission: RE | Admit: 2019-08-18 | Discharge: 2019-08-18 | Disposition: A | Discharge status: Home / Self Care | Payer: Medicare Other | Source: Ambulatory Visit | Attending: Nephrology | Admitting: Nephrology

## 2019-08-18 DIAGNOSIS — T8249XA Other complication of vascular dialysis catheter, initial encounter: Secondary | ICD-10-CM | POA: Diagnosis not present

## 2019-08-18 DIAGNOSIS — Z452 Encounter for adjustment and management of vascular access device: Secondary | ICD-10-CM | POA: Insufficient documentation

## 2019-08-18 DIAGNOSIS — N186 End stage renal disease: Secondary | ICD-10-CM | POA: Insufficient documentation

## 2019-08-18 HISTORY — PX: IR FLUORO GUIDE CV LINE RIGHT: IMG2283

## 2019-08-18 MED ORDER — LIDOCAINE HCL (PF) 1 % IJ SOLN
INTRAMUSCULAR | Status: DC | PRN
  Administered 2019-08-18: 10 mL

## 2019-08-18 MED ORDER — CHLORHEXIDINE GLUCONATE 4 % EX LIQD
CUTANEOUS | Status: AC
  Filled 2019-08-18: qty 15

## 2019-08-18 MED ORDER — CEFAZOLIN SODIUM-DEXTROSE 2-4 GM/100ML-% IV SOLN: 2.0000 g | Freq: Once | INTRAVENOUS | Status: AC

## 2019-08-18 MED ORDER — CEFAZOLIN SODIUM-DEXTROSE 2-4 GM/100ML-% IV SOLN
INTRAVENOUS | Status: AC
  Administered 2019-08-18: 2 g via INTRAVENOUS
  Filled 2019-08-18: qty 100

## 2019-08-18 MED ORDER — LIDOCAINE HCL 1 % IJ SOLN
INTRAMUSCULAR | Status: AC
  Filled 2019-08-18: qty 20

## 2019-08-18 MED ORDER — HEPARIN SODIUM (PORCINE) 1000 UNIT/ML IJ SOLN
INTRAMUSCULAR | Status: AC
  Administered 2019-08-18: 3.8 mL
  Filled 2019-08-18: qty 1

## 2019-08-19 DIAGNOSIS — D509 Iron deficiency anemia, unspecified: Secondary | ICD-10-CM | POA: Diagnosis not present

## 2019-08-19 DIAGNOSIS — D631 Anemia in chronic kidney disease: Secondary | ICD-10-CM | POA: Diagnosis not present

## 2019-08-19 DIAGNOSIS — N186 End stage renal disease: Secondary | ICD-10-CM | POA: Diagnosis not present

## 2019-08-19 DIAGNOSIS — N2581 Secondary hyperparathyroidism of renal origin: Secondary | ICD-10-CM | POA: Diagnosis not present

## 2019-08-19 DIAGNOSIS — Z992 Dependence on renal dialysis: Secondary | ICD-10-CM | POA: Diagnosis not present

## 2019-08-19 DIAGNOSIS — J158 Pneumonia due to other specified bacteria: Secondary | ICD-10-CM | POA: Diagnosis not present

## 2019-08-22 DIAGNOSIS — J158 Pneumonia due to other specified bacteria: Secondary | ICD-10-CM | POA: Diagnosis not present

## 2019-08-22 DIAGNOSIS — D509 Iron deficiency anemia, unspecified: Secondary | ICD-10-CM | POA: Diagnosis not present

## 2019-08-22 DIAGNOSIS — N2581 Secondary hyperparathyroidism of renal origin: Secondary | ICD-10-CM | POA: Diagnosis not present

## 2019-08-22 DIAGNOSIS — N186 End stage renal disease: Secondary | ICD-10-CM | POA: Diagnosis not present

## 2019-08-22 DIAGNOSIS — Z992 Dependence on renal dialysis: Secondary | ICD-10-CM | POA: Diagnosis not present

## 2019-08-22 DIAGNOSIS — D631 Anemia in chronic kidney disease: Secondary | ICD-10-CM | POA: Diagnosis not present

## 2019-08-24 DIAGNOSIS — Z992 Dependence on renal dialysis: Secondary | ICD-10-CM | POA: Diagnosis not present

## 2019-08-24 DIAGNOSIS — J158 Pneumonia due to other specified bacteria: Secondary | ICD-10-CM | POA: Diagnosis not present

## 2019-08-24 DIAGNOSIS — N186 End stage renal disease: Secondary | ICD-10-CM | POA: Diagnosis not present

## 2019-08-24 DIAGNOSIS — D631 Anemia in chronic kidney disease: Secondary | ICD-10-CM | POA: Diagnosis not present

## 2019-08-24 DIAGNOSIS — N2581 Secondary hyperparathyroidism of renal origin: Secondary | ICD-10-CM | POA: Diagnosis not present

## 2019-08-24 DIAGNOSIS — D509 Iron deficiency anemia, unspecified: Secondary | ICD-10-CM | POA: Diagnosis not present

## 2019-08-26 DIAGNOSIS — N186 End stage renal disease: Secondary | ICD-10-CM | POA: Diagnosis not present

## 2019-08-26 DIAGNOSIS — D509 Iron deficiency anemia, unspecified: Secondary | ICD-10-CM | POA: Diagnosis not present

## 2019-08-26 DIAGNOSIS — N2581 Secondary hyperparathyroidism of renal origin: Secondary | ICD-10-CM | POA: Diagnosis not present

## 2019-08-26 DIAGNOSIS — J158 Pneumonia due to other specified bacteria: Secondary | ICD-10-CM | POA: Diagnosis not present

## 2019-08-26 DIAGNOSIS — Z992 Dependence on renal dialysis: Secondary | ICD-10-CM | POA: Diagnosis not present

## 2019-08-26 DIAGNOSIS — D631 Anemia in chronic kidney disease: Secondary | ICD-10-CM | POA: Diagnosis not present

## 2019-08-27 ENCOUNTER — Other Ambulatory Visit (HOSPITAL_COMMUNITY)
Admission: RE | Admit: 2019-08-27 | Discharge: 2019-08-27 | Disposition: A | Discharge status: Home / Self Care | Payer: Medicare Other | Source: Ambulatory Visit | Attending: Internal Medicine | Admitting: Internal Medicine

## 2019-08-27 DIAGNOSIS — Z01812 Encounter for preprocedural laboratory examination: Secondary | ICD-10-CM | POA: Diagnosis not present

## 2019-08-27 DIAGNOSIS — Z20822 Contact with and (suspected) exposure to covid-19: Secondary | ICD-10-CM | POA: Diagnosis not present

## 2019-08-27 LAB — SARS CORONAVIRUS 2 (TAT 6-24 HRS): SARS Coronavirus 2: NEGATIVE

## 2019-08-29 DIAGNOSIS — D509 Iron deficiency anemia, unspecified: Secondary | ICD-10-CM | POA: Diagnosis not present

## 2019-08-29 DIAGNOSIS — N186 End stage renal disease: Secondary | ICD-10-CM | POA: Diagnosis not present

## 2019-08-29 DIAGNOSIS — Z992 Dependence on renal dialysis: Secondary | ICD-10-CM | POA: Diagnosis not present

## 2019-08-29 DIAGNOSIS — D631 Anemia in chronic kidney disease: Secondary | ICD-10-CM | POA: Diagnosis not present

## 2019-08-29 DIAGNOSIS — N2581 Secondary hyperparathyroidism of renal origin: Secondary | ICD-10-CM | POA: Diagnosis not present

## 2019-08-29 DIAGNOSIS — J158 Pneumonia due to other specified bacteria: Secondary | ICD-10-CM | POA: Diagnosis not present

## 2019-08-31 ENCOUNTER — Other Ambulatory Visit: Payer: Self-pay | Admitting: Cardiology

## 2019-08-31 DIAGNOSIS — D509 Iron deficiency anemia, unspecified: Secondary | ICD-10-CM | POA: Diagnosis not present

## 2019-08-31 DIAGNOSIS — I110 Hypertensive heart disease with heart failure: Secondary | ICD-10-CM

## 2019-08-31 DIAGNOSIS — J158 Pneumonia due to other specified bacteria: Secondary | ICD-10-CM | POA: Diagnosis not present

## 2019-08-31 DIAGNOSIS — D631 Anemia in chronic kidney disease: Secondary | ICD-10-CM | POA: Diagnosis not present

## 2019-08-31 DIAGNOSIS — I10 Essential (primary) hypertension: Secondary | ICD-10-CM

## 2019-08-31 DIAGNOSIS — Z992 Dependence on renal dialysis: Secondary | ICD-10-CM | POA: Diagnosis not present

## 2019-08-31 DIAGNOSIS — N186 End stage renal disease: Secondary | ICD-10-CM | POA: Diagnosis not present

## 2019-08-31 DIAGNOSIS — I428 Other cardiomyopathies: Secondary | ICD-10-CM

## 2019-08-31 DIAGNOSIS — N2581 Secondary hyperparathyroidism of renal origin: Secondary | ICD-10-CM | POA: Diagnosis not present

## 2019-09-02 DIAGNOSIS — J158 Pneumonia due to other specified bacteria: Secondary | ICD-10-CM | POA: Diagnosis not present

## 2019-09-02 DIAGNOSIS — N2581 Secondary hyperparathyroidism of renal origin: Secondary | ICD-10-CM | POA: Diagnosis not present

## 2019-09-02 DIAGNOSIS — Z992 Dependence on renal dialysis: Secondary | ICD-10-CM | POA: Diagnosis not present

## 2019-09-02 DIAGNOSIS — D509 Iron deficiency anemia, unspecified: Secondary | ICD-10-CM | POA: Diagnosis not present

## 2019-09-02 DIAGNOSIS — N186 End stage renal disease: Secondary | ICD-10-CM | POA: Diagnosis not present

## 2019-09-02 DIAGNOSIS — D631 Anemia in chronic kidney disease: Secondary | ICD-10-CM | POA: Diagnosis not present

## 2019-09-04 DIAGNOSIS — I12 Hypertensive chronic kidney disease with 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: Secondary | ICD-10-CM | POA: Diagnosis not present

## 2019-09-04 DIAGNOSIS — Z992 Dependence on renal dialysis: Secondary | ICD-10-CM | POA: Diagnosis not present

## 2019-09-04 DIAGNOSIS — N186 End stage renal disease: Secondary | ICD-10-CM | POA: Diagnosis not present

## 2019-09-05 DIAGNOSIS — N186 End stage renal disease: Secondary | ICD-10-CM | POA: Diagnosis not present

## 2019-09-05 DIAGNOSIS — D509 Iron deficiency anemia, unspecified: Secondary | ICD-10-CM | POA: Diagnosis not present

## 2019-09-05 DIAGNOSIS — D631 Anemia in chronic kidney disease: Secondary | ICD-10-CM | POA: Diagnosis not present

## 2019-09-05 DIAGNOSIS — N2581 Secondary hyperparathyroidism of renal origin: Secondary | ICD-10-CM | POA: Diagnosis not present

## 2019-09-05 DIAGNOSIS — J158 Pneumonia due to other specified bacteria: Secondary | ICD-10-CM | POA: Diagnosis not present

## 2019-09-05 DIAGNOSIS — Z992 Dependence on renal dialysis: Secondary | ICD-10-CM | POA: Diagnosis not present

## 2019-09-07 DIAGNOSIS — D509 Iron deficiency anemia, unspecified: Secondary | ICD-10-CM | POA: Diagnosis not present

## 2019-09-07 DIAGNOSIS — Z992 Dependence on renal dialysis: Secondary | ICD-10-CM | POA: Diagnosis not present

## 2019-09-07 DIAGNOSIS — N2581 Secondary hyperparathyroidism of renal origin: Secondary | ICD-10-CM | POA: Diagnosis not present

## 2019-09-07 DIAGNOSIS — D631 Anemia in chronic kidney disease: Secondary | ICD-10-CM | POA: Diagnosis not present

## 2019-09-07 DIAGNOSIS — J158 Pneumonia due to other specified bacteria: Secondary | ICD-10-CM | POA: Diagnosis not present

## 2019-09-07 DIAGNOSIS — N186 End stage renal disease: Secondary | ICD-10-CM | POA: Diagnosis not present

## 2019-09-09 DIAGNOSIS — N2581 Secondary hyperparathyroidism of renal origin: Secondary | ICD-10-CM | POA: Diagnosis not present

## 2019-09-09 DIAGNOSIS — N186 End stage renal disease: Secondary | ICD-10-CM | POA: Diagnosis not present

## 2019-09-09 DIAGNOSIS — Z992 Dependence on renal dialysis: Secondary | ICD-10-CM | POA: Diagnosis not present

## 2019-09-09 DIAGNOSIS — D509 Iron deficiency anemia, unspecified: Secondary | ICD-10-CM | POA: Diagnosis not present

## 2019-09-09 DIAGNOSIS — J158 Pneumonia due to other specified bacteria: Secondary | ICD-10-CM | POA: Diagnosis not present

## 2019-09-09 DIAGNOSIS — D631 Anemia in chronic kidney disease: Secondary | ICD-10-CM | POA: Diagnosis not present

## 2019-09-12 DIAGNOSIS — D509 Iron deficiency anemia, unspecified: Secondary | ICD-10-CM | POA: Diagnosis not present

## 2019-09-12 DIAGNOSIS — N2581 Secondary hyperparathyroidism of renal origin: Secondary | ICD-10-CM | POA: Diagnosis not present

## 2019-09-12 DIAGNOSIS — J158 Pneumonia due to other specified bacteria: Secondary | ICD-10-CM | POA: Diagnosis not present

## 2019-09-12 DIAGNOSIS — Z992 Dependence on renal dialysis: Secondary | ICD-10-CM | POA: Diagnosis not present

## 2019-09-12 DIAGNOSIS — N186 End stage renal disease: Secondary | ICD-10-CM | POA: Diagnosis not present

## 2019-09-12 DIAGNOSIS — D631 Anemia in chronic kidney disease: Secondary | ICD-10-CM | POA: Diagnosis not present

## 2019-09-14 DIAGNOSIS — Z992 Dependence on renal dialysis: Secondary | ICD-10-CM | POA: Diagnosis not present

## 2019-09-14 DIAGNOSIS — J158 Pneumonia due to other specified bacteria: Secondary | ICD-10-CM | POA: Diagnosis not present

## 2019-09-14 DIAGNOSIS — N186 End stage renal disease: Secondary | ICD-10-CM | POA: Diagnosis not present

## 2019-09-14 DIAGNOSIS — N2581 Secondary hyperparathyroidism of renal origin: Secondary | ICD-10-CM | POA: Diagnosis not present

## 2019-09-14 DIAGNOSIS — D509 Iron deficiency anemia, unspecified: Secondary | ICD-10-CM | POA: Diagnosis not present

## 2019-09-14 DIAGNOSIS — D631 Anemia in chronic kidney disease: Secondary | ICD-10-CM | POA: Diagnosis not present

## 2019-09-16 DIAGNOSIS — D631 Anemia in chronic kidney disease: Secondary | ICD-10-CM | POA: Diagnosis not present

## 2019-09-16 DIAGNOSIS — N186 End stage renal disease: Secondary | ICD-10-CM | POA: Diagnosis not present

## 2019-09-16 DIAGNOSIS — N2581 Secondary hyperparathyroidism of renal origin: Secondary | ICD-10-CM | POA: Diagnosis not present

## 2019-09-16 DIAGNOSIS — Z992 Dependence on renal dialysis: Secondary | ICD-10-CM | POA: Diagnosis not present

## 2019-09-16 DIAGNOSIS — D509 Iron deficiency anemia, unspecified: Secondary | ICD-10-CM | POA: Diagnosis not present

## 2019-09-16 DIAGNOSIS — J158 Pneumonia due to other specified bacteria: Secondary | ICD-10-CM | POA: Diagnosis not present

## 2019-09-18 ENCOUNTER — Telehealth: Payer: Self-pay | Admitting: Family Medicine

## 2019-09-18 NOTE — Telephone Encounter (Signed)
After-hours Emergency Line Call  Patient called the after-hours emergency line reporting that he has been feeling a little bit weak and short of breath, reports that these symptoms started last week.  In particular, he reports that he has a hard time laying down flat.  He also has shortness of breath with move around his home, cleaning up after himself and that he occasionally has to stop and rest.  He also shares with me that he recently stopped smoking about 4 months ago.   Per the patient's chart he does have a past medical history of HFrEF with EF 40-45%. Since it has been so hot outside recently he has been drinking a lot more but says he has not been eating quite as much. He does not feel he is drinking "too much"  The patient's chart also lists severe COPD, however the patient did not know about this diagnosis, however per recent pulmonary/cardiology notes in May and June 2021, it is mentioned that he is supposed to have repeat PFTs to rule asthma/COPD in or out (repeat CFT order placed by Dr Ina Homes 07/19/2019).  This is concerning because the patient reported that last week he started occasionally coughing up yellow phlegm.  He denies any nausea, vomiting, fevers, chest pain, chills, lower extremity swelling.  The patient is also ESRD on HD Monday Wednesday Friday.  He last had an HD session on Friday, however reported that he was not able to finish up the last 30 minutes of it due to transportation issues.  He is due for his next HD tomorrow morning at 8 AM.  Based off of the patient's past medical history and current symptoms a heart failure exacerbation seems most likely with his orthopnea in the setting of recent incomplete HD session.  Could also consider COPD exacerbation as the patient is occasionally coughing up yellow phlegm and is more short of breath.  Does not have an oxygen requirement at baseline at home. No subjective fevers, chills, body aches suggestive of pneumonia. However he  could also be experiencing URI due to COVID, other viral etiology.  Ideally this patient would make his way to the emergency department.  However, he is adamant he does not want to go to the hospital.  His instead asking that he have an appointment made at the family practice center for this week.  He has not been seen in our clinic since June 2020.  He has HD session tomorrow 8/16, can get him follow-up appointment on Tuesday.  Appointment made with Dr. Naaman Plummer Autry-Lott. I strongly encouraged the patient to make his way to the ED should his symptoms worsen (shortness of breath, fever, fatigue, etc).  I am forwarding this note to patient's PCP as well as to physician he is scheduled to see on Tuesday.   Milus Banister, [place], PGY-3 09/18/2019 12:43 PM

## 2019-09-19 ENCOUNTER — Other Ambulatory Visit (HOSPITAL_COMMUNITY): Payer: Medicare Other

## 2019-09-19 DIAGNOSIS — N186 End stage renal disease: Secondary | ICD-10-CM | POA: Diagnosis not present

## 2019-09-19 DIAGNOSIS — D509 Iron deficiency anemia, unspecified: Secondary | ICD-10-CM | POA: Diagnosis not present

## 2019-09-19 DIAGNOSIS — Z992 Dependence on renal dialysis: Secondary | ICD-10-CM | POA: Diagnosis not present

## 2019-09-19 DIAGNOSIS — J158 Pneumonia due to other specified bacteria: Secondary | ICD-10-CM | POA: Diagnosis not present

## 2019-09-19 DIAGNOSIS — N2581 Secondary hyperparathyroidism of renal origin: Secondary | ICD-10-CM | POA: Diagnosis not present

## 2019-09-19 DIAGNOSIS — D631 Anemia in chronic kidney disease: Secondary | ICD-10-CM | POA: Diagnosis not present

## 2019-09-20 ENCOUNTER — Encounter: Payer: Self-pay | Admitting: Family Medicine

## 2019-09-20 ENCOUNTER — Ambulatory Visit (INDEPENDENT_AMBULATORY_CARE_PROVIDER_SITE_OTHER): Payer: Medicare Other | Admitting: Family Medicine

## 2019-09-20 ENCOUNTER — Other Ambulatory Visit: Payer: Self-pay

## 2019-09-20 VITALS — BP 152/90 | HR 69 | Wt 182.0 lb

## 2019-09-20 DIAGNOSIS — I10 Essential (primary) hypertension: Secondary | ICD-10-CM | POA: Diagnosis not present

## 2019-09-20 DIAGNOSIS — J209 Acute bronchitis, unspecified: Secondary | ICD-10-CM

## 2019-09-20 DIAGNOSIS — R0609 Other forms of dyspnea: Secondary | ICD-10-CM

## 2019-09-20 DIAGNOSIS — R0600 Dyspnea, unspecified: Secondary | ICD-10-CM | POA: Diagnosis not present

## 2019-09-20 MED ORDER — ANORO ELLIPTA 62.5-25 MCG/INH IN AEPB: 1.0000 | INHALATION_SPRAY | Freq: Every day | RESPIRATORY_TRACT | 1 refills | Status: DC

## 2019-09-20 NOTE — Patient Instructions (Addendum)
It was very nice to meet you today. Please enjoy the rest of your week. Today you were seen for shortness of. I have prescribed an inhaler called Anoro Ellipta to be used daily. Please follow-up with your pulmonologist and cardiologist regularly. Continue smoking cessation, fluid restricted diet, and low-salt diet. Continue going to dialysis regularly as this will with fluid overload.  Please call the clinic at [phone] if your symptoms worsen or you have any concerns. It was our pleasure to serve you.

## 2019-09-20 NOTE — Telephone Encounter (Signed)
Thank you for this information. Will be happy to see the patient today.   Jolisa Intriago Autry-Lott, DO 09/20/2019, 8:02 AM PGY-2, [place]

## 2019-09-20 NOTE — Progress Notes (Signed)
    SUBJECTIVE:   CHIEF COMPLAINT / HPI:   Mr. James Wall is a 54 yo M who presents for the issues below.   Shortness of breath Called the clinic after hours line Sunday due to not being able to breath well. He is feeling better today, now shortness of breath mostly with ADLs. Was able to go to dialysis and get 1.2L off. The swelling in his feet has improved. He no long needs to sleep propped on pillows. He if supposed to have PFTs this Thursday but refuses to go because he believes he will fail and be labeled with COPD. If he is labeled with COPD this will ruin his changes for a kidney transplant which he has looked forward to for 10 years. He stated the pulmonologist was going to give him an inhaler at his last appointment but he decline. He would like to try an inhaler today. He denies shortness of breath at rest, chest pain, palpitations. He endorses a fluid restricted diet and weight loss according to our scale today.    Hypertension: - Medications: 10 mg daily, clonidine 0.3 mg patch daily, hydralazine 25 mg BID, isosorbide dinitrate 20 mg BID, Metoprolol 100 mg BID on dialysis days - Compliance: yes - Checking BP at home: Occasionally; usually 200/100 - Denies any SOB, CP, vision changes, increasing LE edema - medication SEs, or symptoms of hypotension w/ TID dosing of hydralazine - Diet: Fluid restricted  PERTINENT  PMH / PSH: COPD v. Asthma (pulmonologist Dr. Tamala Julian 07/19/2019 PFTs ordered during that encounter), HFrEF EF of 45 to 50% on last echo 12/2017 (Repeat was ordered and not yet done).  Seen cardiologist Dr. Meda Coffee 06/14/2019 isosorbide dinitrate 20 mg twice daily was added to anti-hypertensive regimen. Will need to follow up September.   OBJECTIVE:   BP (!) 152/90   Pulse 69 Comment: ambulating 77  Wt 182 lb (82.6 kg)   SpO2 94% Comment: ambulating 92%  BMI 30.29 kg/m   General: Appears well, no acute distress. Age appropriate. Cardiac: RRR, systolic murmur heard best at  RSB Respiratory: CTAB, normal effort, diffuse prolonged expiratory phase Extremities: 2+ LE pitting edema, palpable pulses. Skin: Warm and dry, LE venous stasis rashes noted  ASSESSMENT/PLAN:   Dyspnea on exertion Acute. Improving. Multifactorial. CHF ex. V. COPD ex. V. Fluid overload pre-dialysis. He appears well, did not desat with ambulation, has lost weight (likely at dry wt. Now), and is without distress/wheezing.  -Encouraged patient to keep cardiology follow up in September -Advise patient to get PFTs done this thursday as he will likely continue to improve with another dialysis session prior to that day; otherwise continue to follow up with pulmonology. -Continue dialysis MWF -Continue fluid restricted diet and low sodium -Start Anora Ellipta daily -Follow up as needed  Essential hypertension BP elevated and patient denies symptoms. Endorses higher blood pressures at home. Endorses lows if he takes a third dose of hydralazine. -Follow up with nephrology at dialysis -Follow up with cardiology as above -Red flag symptoms discussed -Follow up PRN    Gerlene Fee, [place]

## 2019-09-21 DIAGNOSIS — D509 Iron deficiency anemia, unspecified: Secondary | ICD-10-CM | POA: Diagnosis not present

## 2019-09-21 DIAGNOSIS — Z992 Dependence on renal dialysis: Secondary | ICD-10-CM | POA: Diagnosis not present

## 2019-09-21 DIAGNOSIS — N2581 Secondary hyperparathyroidism of renal origin: Secondary | ICD-10-CM | POA: Diagnosis not present

## 2019-09-21 DIAGNOSIS — N186 End stage renal disease: Secondary | ICD-10-CM | POA: Diagnosis not present

## 2019-09-21 DIAGNOSIS — D631 Anemia in chronic kidney disease: Secondary | ICD-10-CM | POA: Diagnosis not present

## 2019-09-21 DIAGNOSIS — J158 Pneumonia due to other specified bacteria: Secondary | ICD-10-CM | POA: Diagnosis not present

## 2019-09-22 ENCOUNTER — Ambulatory Visit: Payer: Medicare Other | Admitting: Primary Care

## 2019-09-22 DIAGNOSIS — R0600 Dyspnea, unspecified: Secondary | ICD-10-CM | POA: Insufficient documentation

## 2019-09-22 DIAGNOSIS — R0609 Other forms of dyspnea: Secondary | ICD-10-CM | POA: Insufficient documentation

## 2019-09-22 NOTE — Assessment & Plan Note (Signed)
BP elevated and patient denies symptoms. Endorses higher blood pressures at home. Endorses lows if he takes a third dose of hydralazine. -Follow up with nephrology at dialysis -Follow up with cardiology as above -Red flag symptoms discussed -Follow up PRN

## 2019-09-22 NOTE — Assessment & Plan Note (Signed)
Acute. Improving. Multifactorial. CHF ex. V. COPD ex. V. Fluid overload pre-dialysis. He appears well, did not desat with ambulation, has lost weight (likely at dry wt. Now), and is without distress/wheezing.  -Encouraged patient to keep cardiology follow up in September -Advise patient to get PFTs done this thursday as he will likely continue to improve with another dialysis session prior to that day; otherwise continue to follow up with pulmonology. -Continue dialysis MWF -Continue fluid restricted diet and low sodium -Start Anora Ellipta daily -Follow up as needed

## 2019-09-23 DIAGNOSIS — N186 End stage renal disease: Secondary | ICD-10-CM | POA: Diagnosis not present

## 2019-09-23 DIAGNOSIS — D631 Anemia in chronic kidney disease: Secondary | ICD-10-CM | POA: Diagnosis not present

## 2019-09-23 DIAGNOSIS — Z992 Dependence on renal dialysis: Secondary | ICD-10-CM | POA: Diagnosis not present

## 2019-09-23 DIAGNOSIS — D509 Iron deficiency anemia, unspecified: Secondary | ICD-10-CM | POA: Diagnosis not present

## 2019-09-23 DIAGNOSIS — J158 Pneumonia due to other specified bacteria: Secondary | ICD-10-CM | POA: Diagnosis not present

## 2019-09-23 DIAGNOSIS — N2581 Secondary hyperparathyroidism of renal origin: Secondary | ICD-10-CM | POA: Diagnosis not present

## 2019-09-26 DIAGNOSIS — Z992 Dependence on renal dialysis: Secondary | ICD-10-CM | POA: Diagnosis not present

## 2019-09-26 DIAGNOSIS — N186 End stage renal disease: Secondary | ICD-10-CM | POA: Diagnosis not present

## 2019-09-26 DIAGNOSIS — J158 Pneumonia due to other specified bacteria: Secondary | ICD-10-CM | POA: Diagnosis not present

## 2019-09-26 DIAGNOSIS — D631 Anemia in chronic kidney disease: Secondary | ICD-10-CM | POA: Diagnosis not present

## 2019-09-26 DIAGNOSIS — N2581 Secondary hyperparathyroidism of renal origin: Secondary | ICD-10-CM | POA: Diagnosis not present

## 2019-09-26 DIAGNOSIS — D509 Iron deficiency anemia, unspecified: Secondary | ICD-10-CM | POA: Diagnosis not present

## 2019-09-28 DIAGNOSIS — J158 Pneumonia due to other specified bacteria: Secondary | ICD-10-CM | POA: Diagnosis not present

## 2019-09-28 DIAGNOSIS — D509 Iron deficiency anemia, unspecified: Secondary | ICD-10-CM | POA: Diagnosis not present

## 2019-09-28 DIAGNOSIS — Z992 Dependence on renal dialysis: Secondary | ICD-10-CM | POA: Diagnosis not present

## 2019-09-28 DIAGNOSIS — D631 Anemia in chronic kidney disease: Secondary | ICD-10-CM | POA: Diagnosis not present

## 2019-09-28 DIAGNOSIS — N2581 Secondary hyperparathyroidism of renal origin: Secondary | ICD-10-CM | POA: Diagnosis not present

## 2019-09-28 DIAGNOSIS — N186 End stage renal disease: Secondary | ICD-10-CM | POA: Diagnosis not present

## 2019-09-30 DIAGNOSIS — N186 End stage renal disease: Secondary | ICD-10-CM | POA: Diagnosis not present

## 2019-09-30 DIAGNOSIS — D509 Iron deficiency anemia, unspecified: Secondary | ICD-10-CM | POA: Diagnosis not present

## 2019-09-30 DIAGNOSIS — J158 Pneumonia due to other specified bacteria: Secondary | ICD-10-CM | POA: Diagnosis not present

## 2019-09-30 DIAGNOSIS — D631 Anemia in chronic kidney disease: Secondary | ICD-10-CM | POA: Diagnosis not present

## 2019-09-30 DIAGNOSIS — Z992 Dependence on renal dialysis: Secondary | ICD-10-CM | POA: Diagnosis not present

## 2019-09-30 DIAGNOSIS — N2581 Secondary hyperparathyroidism of renal origin: Secondary | ICD-10-CM | POA: Diagnosis not present

## 2019-10-03 DIAGNOSIS — Z992 Dependence on renal dialysis: Secondary | ICD-10-CM | POA: Diagnosis not present

## 2019-10-03 DIAGNOSIS — D509 Iron deficiency anemia, unspecified: Secondary | ICD-10-CM | POA: Diagnosis not present

## 2019-10-03 DIAGNOSIS — N186 End stage renal disease: Secondary | ICD-10-CM | POA: Diagnosis not present

## 2019-10-03 DIAGNOSIS — D631 Anemia in chronic kidney disease: Secondary | ICD-10-CM | POA: Diagnosis not present

## 2019-10-03 DIAGNOSIS — N2581 Secondary hyperparathyroidism of renal origin: Secondary | ICD-10-CM | POA: Diagnosis not present

## 2019-10-03 DIAGNOSIS — J158 Pneumonia due to other specified bacteria: Secondary | ICD-10-CM | POA: Diagnosis not present

## 2019-10-05 ENCOUNTER — Other Ambulatory Visit (HOSPITAL_COMMUNITY): Payer: Self-pay | Admitting: Nephrology

## 2019-10-05 DIAGNOSIS — D631 Anemia in chronic kidney disease: Secondary | ICD-10-CM | POA: Diagnosis not present

## 2019-10-05 DIAGNOSIS — I12 Hypertensive chronic kidney disease with 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: Secondary | ICD-10-CM | POA: Diagnosis not present

## 2019-10-05 DIAGNOSIS — N186 End stage renal disease: Secondary | ICD-10-CM

## 2019-10-05 DIAGNOSIS — N2581 Secondary hyperparathyroidism of renal origin: Secondary | ICD-10-CM | POA: Diagnosis not present

## 2019-10-05 DIAGNOSIS — J158 Pneumonia due to other specified bacteria: Secondary | ICD-10-CM | POA: Diagnosis not present

## 2019-10-05 DIAGNOSIS — D509 Iron deficiency anemia, unspecified: Secondary | ICD-10-CM | POA: Diagnosis not present

## 2019-10-05 DIAGNOSIS — Z992 Dependence on renal dialysis: Secondary | ICD-10-CM | POA: Diagnosis not present

## 2019-10-05 DIAGNOSIS — T8242XA Displacement of vascular dialysis catheter, initial encounter: Secondary | ICD-10-CM | POA: Diagnosis not present

## 2019-10-07 ENCOUNTER — Other Ambulatory Visit: Payer: Self-pay

## 2019-10-07 ENCOUNTER — Ambulatory Visit (HOSPITAL_COMMUNITY)
Admission: RE | Admit: 2019-10-07 | Discharge: 2019-10-07 | Disposition: A | Discharge status: Home / Self Care | Payer: Medicare Other | Source: Ambulatory Visit | Attending: Nephrology | Admitting: Nephrology

## 2019-10-07 DIAGNOSIS — Z992 Dependence on renal dialysis: Secondary | ICD-10-CM | POA: Insufficient documentation

## 2019-10-07 DIAGNOSIS — T8242XA Displacement of vascular dialysis catheter, initial encounter: Secondary | ICD-10-CM | POA: Diagnosis not present

## 2019-10-07 DIAGNOSIS — Y839 Surgical procedure, unspecified as the cause of abnormal reaction of the patient, or of later complication, without mention of misadventure at the time of the procedure: Secondary | ICD-10-CM | POA: Diagnosis not present

## 2019-10-07 DIAGNOSIS — N186 End stage renal disease: Secondary | ICD-10-CM | POA: Diagnosis not present

## 2019-10-07 HISTORY — PX: IR FLUORO GUIDE CV LINE RIGHT: IMG2283

## 2019-10-07 MED ORDER — HEPARIN SODIUM (PORCINE) 1000 UNIT/ML IJ SOLN
INTRAMUSCULAR | Status: AC
  Filled 2019-10-07: qty 1

## 2019-10-07 MED ORDER — CEFAZOLIN SODIUM-DEXTROSE 2-4 GM/100ML-% IV SOLN
2.0000 g | Freq: Once | INTRAVENOUS | Status: AC
  Administered 2019-10-07: 2 g via INTRAVENOUS

## 2019-10-07 MED ORDER — LIDOCAINE HCL 1 % IJ SOLN
INTRAMUSCULAR | Status: AC
  Filled 2019-10-07: qty 20

## 2019-10-07 MED ORDER — CEFAZOLIN SODIUM-DEXTROSE 2-4 GM/100ML-% IV SOLN
INTRAVENOUS | Status: AC
  Filled 2019-10-07: qty 100

## 2019-10-07 MED ORDER — LIDOCAINE HCL (PF) 1 % IJ SOLN
INTRAMUSCULAR | Status: AC | PRN
  Administered 2019-10-07: 10 mL

## 2019-10-07 MED ORDER — CHLORHEXIDINE GLUCONATE 4 % EX LIQD
CUTANEOUS | Status: AC
  Filled 2019-10-07: qty 15

## 2019-10-07 NOTE — Procedures (Signed)
Interventional Radiology Procedure Note  Procedure: Exchange for a new 23 cm Palindrome tunneled HD catheter.   Complications: None  Estimated Blood Loss: None  Recommendations: - DC home - Routine line care  Signed,  Criselda Peaches, MD

## 2019-10-07 NOTE — H&P (Signed)
   Patient Status: James Wall - Out-pt  Assessment and Plan:  54 y/o M with history of ESRD on HD MWF with partially dislodged HD catheter who presents today for an HD catheter exchange.  Risks and benefits discussed with the patient including, but not limited to bleeding, infection, vascular injury, pneumothorax which may require chest tube placement, air embolism or even death.  All of the patient's questions were answered, patient is agreeable to proceed.  Consent signed and in chart.  ______________________________________________________________________   History of Present Illness: James Wall is a 54 y.o. male with ESRD on HD MWF via tunneled right IJ catheter which was originally placed 10/23/2015 in IR with exchange 08/18/19 who presents today for a catheter exchange. Mr. Gaiser catheter has become partially dislodged and IR has been asked to exchange it so he can continue HD. Last full HD session was Wednesday with plans for HD tomorrow.  Allergies and medications reviewed.   Review of Systems: A 12 point ROS discussed and pertinent positives are indicated in the HPI above.  All other systems are negative.  Review of Systems  Constitutional: Negative for chills and fever.  Respiratory: Negative for shortness of breath.   Cardiovascular: Negative for chest pain.  Gastrointestinal: Negative for abdominal pain.  Musculoskeletal: Negative for back pain.    Vital Signs: There were no vitals taken for this visit.  Physical Exam Constitutional:      General: He is not in acute distress. HENT:     Head: Normocephalic.  Cardiovascular:     Rate and Rhythm: Normal rate.     Comments: (+) tunneled right IJ cath - cuff exposed. Pulmonary:     Effort: Pulmonary effort is normal.  Abdominal:     Palpations: Abdomen is soft.  Skin:    General: Skin is warm and dry.  Neurological:     Mental Status: He is alert.      Imaging reviewed.   Labs:  COAGS: No  results for input(s): INR, APTT in the last 8760 hours.  BMP: No results for input(s): NA, K, CL, CO2, GLUCOSE, BUN, CALCIUM, CREATININE, GFRNONAA, GFRAA in the last 8760 hours.  Invalid input(s): CMP     Electronically Signed: Joaquim Nam, PA-C 10/07/2019, 11:45 AM   I spent a total of 15 minutes in face to face in clinical consultation, greater than 50% of which was counseling/coordinating care for venous access.

## 2019-10-08 DIAGNOSIS — J158 Pneumonia due to other specified bacteria: Secondary | ICD-10-CM | POA: Diagnosis not present

## 2019-10-08 DIAGNOSIS — N2581 Secondary hyperparathyroidism of renal origin: Secondary | ICD-10-CM | POA: Diagnosis not present

## 2019-10-08 DIAGNOSIS — D631 Anemia in chronic kidney disease: Secondary | ICD-10-CM | POA: Diagnosis not present

## 2019-10-08 DIAGNOSIS — N186 End stage renal disease: Secondary | ICD-10-CM | POA: Diagnosis not present

## 2019-10-08 DIAGNOSIS — Z992 Dependence on renal dialysis: Secondary | ICD-10-CM | POA: Diagnosis not present

## 2019-10-08 DIAGNOSIS — D509 Iron deficiency anemia, unspecified: Secondary | ICD-10-CM | POA: Diagnosis not present

## 2019-10-10 DIAGNOSIS — D631 Anemia in chronic kidney disease: Secondary | ICD-10-CM | POA: Diagnosis not present

## 2019-10-10 DIAGNOSIS — Z992 Dependence on renal dialysis: Secondary | ICD-10-CM | POA: Diagnosis not present

## 2019-10-10 DIAGNOSIS — N2581 Secondary hyperparathyroidism of renal origin: Secondary | ICD-10-CM | POA: Diagnosis not present

## 2019-10-10 DIAGNOSIS — N186 End stage renal disease: Secondary | ICD-10-CM | POA: Diagnosis not present

## 2019-10-10 DIAGNOSIS — J158 Pneumonia due to other specified bacteria: Secondary | ICD-10-CM | POA: Diagnosis not present

## 2019-10-10 DIAGNOSIS — D509 Iron deficiency anemia, unspecified: Secondary | ICD-10-CM | POA: Diagnosis not present

## 2019-10-12 DIAGNOSIS — Z992 Dependence on renal dialysis: Secondary | ICD-10-CM | POA: Diagnosis not present

## 2019-10-12 DIAGNOSIS — N186 End stage renal disease: Secondary | ICD-10-CM | POA: Diagnosis not present

## 2019-10-12 DIAGNOSIS — D631 Anemia in chronic kidney disease: Secondary | ICD-10-CM | POA: Diagnosis not present

## 2019-10-12 DIAGNOSIS — J158 Pneumonia due to other specified bacteria: Secondary | ICD-10-CM | POA: Diagnosis not present

## 2019-10-12 DIAGNOSIS — D509 Iron deficiency anemia, unspecified: Secondary | ICD-10-CM | POA: Diagnosis not present

## 2019-10-12 DIAGNOSIS — N2581 Secondary hyperparathyroidism of renal origin: Secondary | ICD-10-CM | POA: Diagnosis not present

## 2019-10-14 DIAGNOSIS — Z992 Dependence on renal dialysis: Secondary | ICD-10-CM | POA: Diagnosis not present

## 2019-10-14 DIAGNOSIS — N186 End stage renal disease: Secondary | ICD-10-CM | POA: Diagnosis not present

## 2019-10-14 DIAGNOSIS — J158 Pneumonia due to other specified bacteria: Secondary | ICD-10-CM | POA: Diagnosis not present

## 2019-10-14 DIAGNOSIS — D631 Anemia in chronic kidney disease: Secondary | ICD-10-CM | POA: Diagnosis not present

## 2019-10-14 DIAGNOSIS — D509 Iron deficiency anemia, unspecified: Secondary | ICD-10-CM | POA: Diagnosis not present

## 2019-10-14 DIAGNOSIS — N2581 Secondary hyperparathyroidism of renal origin: Secondary | ICD-10-CM | POA: Diagnosis not present

## 2019-10-17 DIAGNOSIS — Z992 Dependence on renal dialysis: Secondary | ICD-10-CM | POA: Diagnosis not present

## 2019-10-17 DIAGNOSIS — J158 Pneumonia due to other specified bacteria: Secondary | ICD-10-CM | POA: Diagnosis not present

## 2019-10-17 DIAGNOSIS — N186 End stage renal disease: Secondary | ICD-10-CM | POA: Diagnosis not present

## 2019-10-17 DIAGNOSIS — N2581 Secondary hyperparathyroidism of renal origin: Secondary | ICD-10-CM | POA: Diagnosis not present

## 2019-10-17 DIAGNOSIS — D631 Anemia in chronic kidney disease: Secondary | ICD-10-CM | POA: Diagnosis not present

## 2019-10-17 DIAGNOSIS — D509 Iron deficiency anemia, unspecified: Secondary | ICD-10-CM | POA: Diagnosis not present

## 2019-10-19 DIAGNOSIS — N186 End stage renal disease: Secondary | ICD-10-CM | POA: Diagnosis not present

## 2019-10-19 DIAGNOSIS — N2581 Secondary hyperparathyroidism of renal origin: Secondary | ICD-10-CM | POA: Diagnosis not present

## 2019-10-19 DIAGNOSIS — D631 Anemia in chronic kidney disease: Secondary | ICD-10-CM | POA: Diagnosis not present

## 2019-10-19 DIAGNOSIS — D509 Iron deficiency anemia, unspecified: Secondary | ICD-10-CM | POA: Diagnosis not present

## 2019-10-19 DIAGNOSIS — Z992 Dependence on renal dialysis: Secondary | ICD-10-CM | POA: Diagnosis not present

## 2019-10-19 DIAGNOSIS — J158 Pneumonia due to other specified bacteria: Secondary | ICD-10-CM | POA: Diagnosis not present

## 2019-10-21 DIAGNOSIS — D631 Anemia in chronic kidney disease: Secondary | ICD-10-CM | POA: Diagnosis not present

## 2019-10-21 DIAGNOSIS — J158 Pneumonia due to other specified bacteria: Secondary | ICD-10-CM | POA: Diagnosis not present

## 2019-10-21 DIAGNOSIS — N186 End stage renal disease: Secondary | ICD-10-CM | POA: Diagnosis not present

## 2019-10-21 DIAGNOSIS — N2581 Secondary hyperparathyroidism of renal origin: Secondary | ICD-10-CM | POA: Diagnosis not present

## 2019-10-21 DIAGNOSIS — D509 Iron deficiency anemia, unspecified: Secondary | ICD-10-CM | POA: Diagnosis not present

## 2019-10-21 DIAGNOSIS — Z992 Dependence on renal dialysis: Secondary | ICD-10-CM | POA: Diagnosis not present

## 2019-10-24 DIAGNOSIS — J158 Pneumonia due to other specified bacteria: Secondary | ICD-10-CM | POA: Diagnosis not present

## 2019-10-24 DIAGNOSIS — N2581 Secondary hyperparathyroidism of renal origin: Secondary | ICD-10-CM | POA: Diagnosis not present

## 2019-10-24 DIAGNOSIS — N186 End stage renal disease: Secondary | ICD-10-CM | POA: Diagnosis not present

## 2019-10-24 DIAGNOSIS — D509 Iron deficiency anemia, unspecified: Secondary | ICD-10-CM | POA: Diagnosis not present

## 2019-10-24 DIAGNOSIS — Z992 Dependence on renal dialysis: Secondary | ICD-10-CM | POA: Diagnosis not present

## 2019-10-24 DIAGNOSIS — D631 Anemia in chronic kidney disease: Secondary | ICD-10-CM | POA: Diagnosis not present

## 2019-10-25 ENCOUNTER — Ambulatory Visit: Payer: Medicare Other | Admitting: Cardiology

## 2019-10-26 DIAGNOSIS — D631 Anemia in chronic kidney disease: Secondary | ICD-10-CM | POA: Diagnosis not present

## 2019-10-26 DIAGNOSIS — D509 Iron deficiency anemia, unspecified: Secondary | ICD-10-CM | POA: Diagnosis not present

## 2019-10-26 DIAGNOSIS — N2581 Secondary hyperparathyroidism of renal origin: Secondary | ICD-10-CM | POA: Diagnosis not present

## 2019-10-26 DIAGNOSIS — J158 Pneumonia due to other specified bacteria: Secondary | ICD-10-CM | POA: Diagnosis not present

## 2019-10-26 DIAGNOSIS — N186 End stage renal disease: Secondary | ICD-10-CM | POA: Diagnosis not present

## 2019-10-26 DIAGNOSIS — Z992 Dependence on renal dialysis: Secondary | ICD-10-CM | POA: Diagnosis not present

## 2019-10-28 DIAGNOSIS — D509 Iron deficiency anemia, unspecified: Secondary | ICD-10-CM | POA: Diagnosis not present

## 2019-10-28 DIAGNOSIS — Z992 Dependence on renal dialysis: Secondary | ICD-10-CM | POA: Diagnosis not present

## 2019-10-28 DIAGNOSIS — N186 End stage renal disease: Secondary | ICD-10-CM | POA: Diagnosis not present

## 2019-10-28 DIAGNOSIS — N2581 Secondary hyperparathyroidism of renal origin: Secondary | ICD-10-CM | POA: Diagnosis not present

## 2019-10-28 DIAGNOSIS — J158 Pneumonia due to other specified bacteria: Secondary | ICD-10-CM | POA: Diagnosis not present

## 2019-10-28 DIAGNOSIS — D631 Anemia in chronic kidney disease: Secondary | ICD-10-CM | POA: Diagnosis not present

## 2019-10-31 DIAGNOSIS — D509 Iron deficiency anemia, unspecified: Secondary | ICD-10-CM | POA: Diagnosis not present

## 2019-10-31 DIAGNOSIS — Z992 Dependence on renal dialysis: Secondary | ICD-10-CM | POA: Diagnosis not present

## 2019-10-31 DIAGNOSIS — N186 End stage renal disease: Secondary | ICD-10-CM | POA: Diagnosis not present

## 2019-10-31 DIAGNOSIS — J158 Pneumonia due to other specified bacteria: Secondary | ICD-10-CM | POA: Diagnosis not present

## 2019-10-31 DIAGNOSIS — N2581 Secondary hyperparathyroidism of renal origin: Secondary | ICD-10-CM | POA: Diagnosis not present

## 2019-10-31 DIAGNOSIS — D631 Anemia in chronic kidney disease: Secondary | ICD-10-CM | POA: Diagnosis not present

## 2019-11-02 DIAGNOSIS — J158 Pneumonia due to other specified bacteria: Secondary | ICD-10-CM | POA: Diagnosis not present

## 2019-11-02 DIAGNOSIS — D631 Anemia in chronic kidney disease: Secondary | ICD-10-CM | POA: Diagnosis not present

## 2019-11-02 DIAGNOSIS — N186 End stage renal disease: Secondary | ICD-10-CM | POA: Diagnosis not present

## 2019-11-02 DIAGNOSIS — D509 Iron deficiency anemia, unspecified: Secondary | ICD-10-CM | POA: Diagnosis not present

## 2019-11-02 DIAGNOSIS — N2581 Secondary hyperparathyroidism of renal origin: Secondary | ICD-10-CM | POA: Diagnosis not present

## 2019-11-02 DIAGNOSIS — Z992 Dependence on renal dialysis: Secondary | ICD-10-CM | POA: Diagnosis not present

## 2019-11-04 DIAGNOSIS — D631 Anemia in chronic kidney disease: Secondary | ICD-10-CM | POA: Diagnosis not present

## 2019-11-04 DIAGNOSIS — I12 Hypertensive chronic kidney disease with 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: Secondary | ICD-10-CM | POA: Diagnosis not present

## 2019-11-04 DIAGNOSIS — J158 Pneumonia due to other specified bacteria: Secondary | ICD-10-CM | POA: Diagnosis not present

## 2019-11-04 DIAGNOSIS — N186 End stage renal disease: Secondary | ICD-10-CM | POA: Diagnosis not present

## 2019-11-04 DIAGNOSIS — Z992 Dependence on renal dialysis: Secondary | ICD-10-CM | POA: Diagnosis not present

## 2019-11-04 DIAGNOSIS — N2581 Secondary hyperparathyroidism of renal origin: Secondary | ICD-10-CM | POA: Diagnosis not present

## 2019-11-04 DIAGNOSIS — D509 Iron deficiency anemia, unspecified: Secondary | ICD-10-CM | POA: Diagnosis not present

## 2019-11-07 DIAGNOSIS — D509 Iron deficiency anemia, unspecified: Secondary | ICD-10-CM | POA: Diagnosis not present

## 2019-11-07 DIAGNOSIS — N2581 Secondary hyperparathyroidism of renal origin: Secondary | ICD-10-CM | POA: Diagnosis not present

## 2019-11-07 DIAGNOSIS — D631 Anemia in chronic kidney disease: Secondary | ICD-10-CM | POA: Diagnosis not present

## 2019-11-07 DIAGNOSIS — N186 End stage renal disease: Secondary | ICD-10-CM | POA: Diagnosis not present

## 2019-11-07 DIAGNOSIS — Z992 Dependence on renal dialysis: Secondary | ICD-10-CM | POA: Diagnosis not present

## 2019-11-07 DIAGNOSIS — J158 Pneumonia due to other specified bacteria: Secondary | ICD-10-CM | POA: Diagnosis not present

## 2019-11-09 DIAGNOSIS — N186 End stage renal disease: Secondary | ICD-10-CM | POA: Diagnosis not present

## 2019-11-09 DIAGNOSIS — D509 Iron deficiency anemia, unspecified: Secondary | ICD-10-CM | POA: Diagnosis not present

## 2019-11-09 DIAGNOSIS — N2581 Secondary hyperparathyroidism of renal origin: Secondary | ICD-10-CM | POA: Diagnosis not present

## 2019-11-09 DIAGNOSIS — Z992 Dependence on renal dialysis: Secondary | ICD-10-CM | POA: Diagnosis not present

## 2019-11-09 DIAGNOSIS — D631 Anemia in chronic kidney disease: Secondary | ICD-10-CM | POA: Diagnosis not present

## 2019-11-09 DIAGNOSIS — J158 Pneumonia due to other specified bacteria: Secondary | ICD-10-CM | POA: Diagnosis not present

## 2019-11-11 DIAGNOSIS — D631 Anemia in chronic kidney disease: Secondary | ICD-10-CM | POA: Diagnosis not present

## 2019-11-11 DIAGNOSIS — D509 Iron deficiency anemia, unspecified: Secondary | ICD-10-CM | POA: Diagnosis not present

## 2019-11-11 DIAGNOSIS — N186 End stage renal disease: Secondary | ICD-10-CM | POA: Diagnosis not present

## 2019-11-11 DIAGNOSIS — J158 Pneumonia due to other specified bacteria: Secondary | ICD-10-CM | POA: Diagnosis not present

## 2019-11-11 DIAGNOSIS — N2581 Secondary hyperparathyroidism of renal origin: Secondary | ICD-10-CM | POA: Diagnosis not present

## 2019-11-11 DIAGNOSIS — Z992 Dependence on renal dialysis: Secondary | ICD-10-CM | POA: Diagnosis not present

## 2019-11-14 DIAGNOSIS — N186 End stage renal disease: Secondary | ICD-10-CM | POA: Diagnosis not present

## 2019-11-14 DIAGNOSIS — D631 Anemia in chronic kidney disease: Secondary | ICD-10-CM | POA: Diagnosis not present

## 2019-11-14 DIAGNOSIS — Z992 Dependence on renal dialysis: Secondary | ICD-10-CM | POA: Diagnosis not present

## 2019-11-14 DIAGNOSIS — N2581 Secondary hyperparathyroidism of renal origin: Secondary | ICD-10-CM | POA: Diagnosis not present

## 2019-11-14 DIAGNOSIS — D509 Iron deficiency anemia, unspecified: Secondary | ICD-10-CM | POA: Diagnosis not present

## 2019-11-14 DIAGNOSIS — J158 Pneumonia due to other specified bacteria: Secondary | ICD-10-CM | POA: Diagnosis not present

## 2019-11-16 DIAGNOSIS — Z992 Dependence on renal dialysis: Secondary | ICD-10-CM | POA: Diagnosis not present

## 2019-11-16 DIAGNOSIS — N2581 Secondary hyperparathyroidism of renal origin: Secondary | ICD-10-CM | POA: Diagnosis not present

## 2019-11-16 DIAGNOSIS — D509 Iron deficiency anemia, unspecified: Secondary | ICD-10-CM | POA: Diagnosis not present

## 2019-11-16 DIAGNOSIS — N186 End stage renal disease: Secondary | ICD-10-CM | POA: Diagnosis not present

## 2019-11-16 DIAGNOSIS — J158 Pneumonia due to other specified bacteria: Secondary | ICD-10-CM | POA: Diagnosis not present

## 2019-11-16 DIAGNOSIS — D631 Anemia in chronic kidney disease: Secondary | ICD-10-CM | POA: Diagnosis not present

## 2019-11-18 DIAGNOSIS — N2581 Secondary hyperparathyroidism of renal origin: Secondary | ICD-10-CM | POA: Diagnosis not present

## 2019-11-18 DIAGNOSIS — D631 Anemia in chronic kidney disease: Secondary | ICD-10-CM | POA: Diagnosis not present

## 2019-11-18 DIAGNOSIS — J158 Pneumonia due to other specified bacteria: Secondary | ICD-10-CM | POA: Diagnosis not present

## 2019-11-18 DIAGNOSIS — Z992 Dependence on renal dialysis: Secondary | ICD-10-CM | POA: Diagnosis not present

## 2019-11-18 DIAGNOSIS — N186 End stage renal disease: Secondary | ICD-10-CM | POA: Diagnosis not present

## 2019-11-18 DIAGNOSIS — D509 Iron deficiency anemia, unspecified: Secondary | ICD-10-CM | POA: Diagnosis not present

## 2019-11-21 DIAGNOSIS — N2581 Secondary hyperparathyroidism of renal origin: Secondary | ICD-10-CM | POA: Diagnosis not present

## 2019-11-21 DIAGNOSIS — D509 Iron deficiency anemia, unspecified: Secondary | ICD-10-CM | POA: Diagnosis not present

## 2019-11-21 DIAGNOSIS — N186 End stage renal disease: Secondary | ICD-10-CM | POA: Diagnosis not present

## 2019-11-21 DIAGNOSIS — Z992 Dependence on renal dialysis: Secondary | ICD-10-CM | POA: Diagnosis not present

## 2019-11-21 DIAGNOSIS — J158 Pneumonia due to other specified bacteria: Secondary | ICD-10-CM | POA: Diagnosis not present

## 2019-11-21 DIAGNOSIS — D631 Anemia in chronic kidney disease: Secondary | ICD-10-CM | POA: Diagnosis not present

## 2019-11-23 DIAGNOSIS — D631 Anemia in chronic kidney disease: Secondary | ICD-10-CM | POA: Diagnosis not present

## 2019-11-23 DIAGNOSIS — N186 End stage renal disease: Secondary | ICD-10-CM | POA: Diagnosis not present

## 2019-11-23 DIAGNOSIS — N2581 Secondary hyperparathyroidism of renal origin: Secondary | ICD-10-CM | POA: Diagnosis not present

## 2019-11-23 DIAGNOSIS — D509 Iron deficiency anemia, unspecified: Secondary | ICD-10-CM | POA: Diagnosis not present

## 2019-11-23 DIAGNOSIS — Z992 Dependence on renal dialysis: Secondary | ICD-10-CM | POA: Diagnosis not present

## 2019-11-23 DIAGNOSIS — J158 Pneumonia due to other specified bacteria: Secondary | ICD-10-CM | POA: Diagnosis not present

## 2019-11-25 DIAGNOSIS — D631 Anemia in chronic kidney disease: Secondary | ICD-10-CM | POA: Diagnosis not present

## 2019-11-25 DIAGNOSIS — D509 Iron deficiency anemia, unspecified: Secondary | ICD-10-CM | POA: Diagnosis not present

## 2019-11-25 DIAGNOSIS — Z992 Dependence on renal dialysis: Secondary | ICD-10-CM | POA: Diagnosis not present

## 2019-11-25 DIAGNOSIS — N186 End stage renal disease: Secondary | ICD-10-CM | POA: Diagnosis not present

## 2019-11-25 DIAGNOSIS — J158 Pneumonia due to other specified bacteria: Secondary | ICD-10-CM | POA: Diagnosis not present

## 2019-11-25 DIAGNOSIS — N2581 Secondary hyperparathyroidism of renal origin: Secondary | ICD-10-CM | POA: Diagnosis not present

## 2019-11-28 DIAGNOSIS — N186 End stage renal disease: Secondary | ICD-10-CM | POA: Diagnosis not present

## 2019-11-28 DIAGNOSIS — J158 Pneumonia due to other specified bacteria: Secondary | ICD-10-CM | POA: Diagnosis not present

## 2019-11-28 DIAGNOSIS — D509 Iron deficiency anemia, unspecified: Secondary | ICD-10-CM | POA: Diagnosis not present

## 2019-11-28 DIAGNOSIS — N2581 Secondary hyperparathyroidism of renal origin: Secondary | ICD-10-CM | POA: Diagnosis not present

## 2019-11-28 DIAGNOSIS — D631 Anemia in chronic kidney disease: Secondary | ICD-10-CM | POA: Diagnosis not present

## 2019-11-28 DIAGNOSIS — Z992 Dependence on renal dialysis: Secondary | ICD-10-CM | POA: Diagnosis not present

## 2019-11-30 DIAGNOSIS — N186 End stage renal disease: Secondary | ICD-10-CM | POA: Diagnosis not present

## 2019-11-30 DIAGNOSIS — J158 Pneumonia due to other specified bacteria: Secondary | ICD-10-CM | POA: Diagnosis not present

## 2019-11-30 DIAGNOSIS — D631 Anemia in chronic kidney disease: Secondary | ICD-10-CM | POA: Diagnosis not present

## 2019-11-30 DIAGNOSIS — Z992 Dependence on renal dialysis: Secondary | ICD-10-CM | POA: Diagnosis not present

## 2019-11-30 DIAGNOSIS — N2581 Secondary hyperparathyroidism of renal origin: Secondary | ICD-10-CM | POA: Diagnosis not present

## 2019-11-30 DIAGNOSIS — D509 Iron deficiency anemia, unspecified: Secondary | ICD-10-CM | POA: Diagnosis not present

## 2019-12-02 DIAGNOSIS — D509 Iron deficiency anemia, unspecified: Secondary | ICD-10-CM | POA: Diagnosis not present

## 2019-12-02 DIAGNOSIS — N2581 Secondary hyperparathyroidism of renal origin: Secondary | ICD-10-CM | POA: Diagnosis not present

## 2019-12-02 DIAGNOSIS — Z992 Dependence on renal dialysis: Secondary | ICD-10-CM | POA: Diagnosis not present

## 2019-12-02 DIAGNOSIS — D631 Anemia in chronic kidney disease: Secondary | ICD-10-CM | POA: Diagnosis not present

## 2019-12-02 DIAGNOSIS — N186 End stage renal disease: Secondary | ICD-10-CM | POA: Diagnosis not present

## 2019-12-02 DIAGNOSIS — J158 Pneumonia due to other specified bacteria: Secondary | ICD-10-CM | POA: Diagnosis not present

## 2019-12-05 DIAGNOSIS — Z992 Dependence on renal dialysis: Secondary | ICD-10-CM | POA: Diagnosis not present

## 2019-12-05 DIAGNOSIS — D631 Anemia in chronic kidney disease: Secondary | ICD-10-CM | POA: Diagnosis not present

## 2019-12-05 DIAGNOSIS — N2581 Secondary hyperparathyroidism of renal origin: Secondary | ICD-10-CM | POA: Diagnosis not present

## 2019-12-05 DIAGNOSIS — J158 Pneumonia due to other specified bacteria: Secondary | ICD-10-CM | POA: Diagnosis not present

## 2019-12-05 DIAGNOSIS — I12 Hypertensive chronic kidney disease with 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: Secondary | ICD-10-CM | POA: Diagnosis not present

## 2019-12-05 DIAGNOSIS — N186 End stage renal disease: Secondary | ICD-10-CM | POA: Diagnosis not present

## 2019-12-05 DIAGNOSIS — D509 Iron deficiency anemia, unspecified: Secondary | ICD-10-CM | POA: Diagnosis not present

## 2019-12-07 DIAGNOSIS — Z992 Dependence on renal dialysis: Secondary | ICD-10-CM | POA: Diagnosis not present

## 2019-12-07 DIAGNOSIS — J158 Pneumonia due to other specified bacteria: Secondary | ICD-10-CM | POA: Diagnosis not present

## 2019-12-07 DIAGNOSIS — N186 End stage renal disease: Secondary | ICD-10-CM | POA: Diagnosis not present

## 2019-12-07 DIAGNOSIS — D631 Anemia in chronic kidney disease: Secondary | ICD-10-CM | POA: Diagnosis not present

## 2019-12-07 DIAGNOSIS — N2581 Secondary hyperparathyroidism of renal origin: Secondary | ICD-10-CM | POA: Diagnosis not present

## 2019-12-07 DIAGNOSIS — D509 Iron deficiency anemia, unspecified: Secondary | ICD-10-CM | POA: Diagnosis not present

## 2019-12-09 DIAGNOSIS — Z992 Dependence on renal dialysis: Secondary | ICD-10-CM | POA: Diagnosis not present

## 2019-12-09 DIAGNOSIS — J158 Pneumonia due to other specified bacteria: Secondary | ICD-10-CM | POA: Diagnosis not present

## 2019-12-09 DIAGNOSIS — D631 Anemia in chronic kidney disease: Secondary | ICD-10-CM | POA: Diagnosis not present

## 2019-12-09 DIAGNOSIS — N2581 Secondary hyperparathyroidism of renal origin: Secondary | ICD-10-CM | POA: Diagnosis not present

## 2019-12-09 DIAGNOSIS — D509 Iron deficiency anemia, unspecified: Secondary | ICD-10-CM | POA: Diagnosis not present

## 2019-12-09 DIAGNOSIS — N186 End stage renal disease: Secondary | ICD-10-CM | POA: Diagnosis not present

## 2019-12-12 DIAGNOSIS — J158 Pneumonia due to other specified bacteria: Secondary | ICD-10-CM | POA: Diagnosis not present

## 2019-12-12 DIAGNOSIS — N2581 Secondary hyperparathyroidism of renal origin: Secondary | ICD-10-CM | POA: Diagnosis not present

## 2019-12-12 DIAGNOSIS — D631 Anemia in chronic kidney disease: Secondary | ICD-10-CM | POA: Diagnosis not present

## 2019-12-12 DIAGNOSIS — D509 Iron deficiency anemia, unspecified: Secondary | ICD-10-CM | POA: Diagnosis not present

## 2019-12-12 DIAGNOSIS — N186 End stage renal disease: Secondary | ICD-10-CM | POA: Diagnosis not present

## 2019-12-12 DIAGNOSIS — Z992 Dependence on renal dialysis: Secondary | ICD-10-CM | POA: Diagnosis not present

## 2019-12-14 DIAGNOSIS — D631 Anemia in chronic kidney disease: Secondary | ICD-10-CM | POA: Diagnosis not present

## 2019-12-14 DIAGNOSIS — Z992 Dependence on renal dialysis: Secondary | ICD-10-CM | POA: Diagnosis not present

## 2019-12-14 DIAGNOSIS — N2581 Secondary hyperparathyroidism of renal origin: Secondary | ICD-10-CM | POA: Diagnosis not present

## 2019-12-14 DIAGNOSIS — N186 End stage renal disease: Secondary | ICD-10-CM | POA: Diagnosis not present

## 2019-12-14 DIAGNOSIS — J158 Pneumonia due to other specified bacteria: Secondary | ICD-10-CM | POA: Diagnosis not present

## 2019-12-14 DIAGNOSIS — D509 Iron deficiency anemia, unspecified: Secondary | ICD-10-CM | POA: Diagnosis not present

## 2019-12-16 DIAGNOSIS — J158 Pneumonia due to other specified bacteria: Secondary | ICD-10-CM | POA: Diagnosis not present

## 2019-12-16 DIAGNOSIS — Z992 Dependence on renal dialysis: Secondary | ICD-10-CM | POA: Diagnosis not present

## 2019-12-16 DIAGNOSIS — N2581 Secondary hyperparathyroidism of renal origin: Secondary | ICD-10-CM | POA: Diagnosis not present

## 2019-12-16 DIAGNOSIS — D509 Iron deficiency anemia, unspecified: Secondary | ICD-10-CM | POA: Diagnosis not present

## 2019-12-16 DIAGNOSIS — N186 End stage renal disease: Secondary | ICD-10-CM | POA: Diagnosis not present

## 2019-12-16 DIAGNOSIS — D631 Anemia in chronic kidney disease: Secondary | ICD-10-CM | POA: Diagnosis not present

## 2019-12-19 DIAGNOSIS — J158 Pneumonia due to other specified bacteria: Secondary | ICD-10-CM | POA: Diagnosis not present

## 2019-12-19 DIAGNOSIS — N186 End stage renal disease: Secondary | ICD-10-CM | POA: Diagnosis not present

## 2019-12-19 DIAGNOSIS — N2581 Secondary hyperparathyroidism of renal origin: Secondary | ICD-10-CM | POA: Diagnosis not present

## 2019-12-19 DIAGNOSIS — D509 Iron deficiency anemia, unspecified: Secondary | ICD-10-CM | POA: Diagnosis not present

## 2019-12-19 DIAGNOSIS — Z992 Dependence on renal dialysis: Secondary | ICD-10-CM | POA: Diagnosis not present

## 2019-12-19 DIAGNOSIS — D631 Anemia in chronic kidney disease: Secondary | ICD-10-CM | POA: Diagnosis not present

## 2019-12-21 DIAGNOSIS — J158 Pneumonia due to other specified bacteria: Secondary | ICD-10-CM | POA: Diagnosis not present

## 2019-12-21 DIAGNOSIS — N186 End stage renal disease: Secondary | ICD-10-CM | POA: Diagnosis not present

## 2019-12-21 DIAGNOSIS — D631 Anemia in chronic kidney disease: Secondary | ICD-10-CM | POA: Diagnosis not present

## 2019-12-21 DIAGNOSIS — N2581 Secondary hyperparathyroidism of renal origin: Secondary | ICD-10-CM | POA: Diagnosis not present

## 2019-12-21 DIAGNOSIS — D509 Iron deficiency anemia, unspecified: Secondary | ICD-10-CM | POA: Diagnosis not present

## 2019-12-21 DIAGNOSIS — Z992 Dependence on renal dialysis: Secondary | ICD-10-CM | POA: Diagnosis not present

## 2019-12-23 DIAGNOSIS — D631 Anemia in chronic kidney disease: Secondary | ICD-10-CM | POA: Diagnosis not present

## 2019-12-23 DIAGNOSIS — D509 Iron deficiency anemia, unspecified: Secondary | ICD-10-CM | POA: Diagnosis not present

## 2019-12-23 DIAGNOSIS — N2581 Secondary hyperparathyroidism of renal origin: Secondary | ICD-10-CM | POA: Diagnosis not present

## 2019-12-23 DIAGNOSIS — N186 End stage renal disease: Secondary | ICD-10-CM | POA: Diagnosis not present

## 2019-12-23 DIAGNOSIS — J158 Pneumonia due to other specified bacteria: Secondary | ICD-10-CM | POA: Diagnosis not present

## 2019-12-23 DIAGNOSIS — Z992 Dependence on renal dialysis: Secondary | ICD-10-CM | POA: Diagnosis not present

## 2019-12-25 DIAGNOSIS — D631 Anemia in chronic kidney disease: Secondary | ICD-10-CM | POA: Diagnosis not present

## 2019-12-25 DIAGNOSIS — Z992 Dependence on renal dialysis: Secondary | ICD-10-CM | POA: Diagnosis not present

## 2019-12-25 DIAGNOSIS — N2581 Secondary hyperparathyroidism of renal origin: Secondary | ICD-10-CM | POA: Diagnosis not present

## 2019-12-25 DIAGNOSIS — D509 Iron deficiency anemia, unspecified: Secondary | ICD-10-CM | POA: Diagnosis not present

## 2019-12-25 DIAGNOSIS — J158 Pneumonia due to other specified bacteria: Secondary | ICD-10-CM | POA: Diagnosis not present

## 2019-12-25 DIAGNOSIS — N186 End stage renal disease: Secondary | ICD-10-CM | POA: Diagnosis not present

## 2019-12-27 DIAGNOSIS — D509 Iron deficiency anemia, unspecified: Secondary | ICD-10-CM | POA: Diagnosis not present

## 2019-12-27 DIAGNOSIS — J158 Pneumonia due to other specified bacteria: Secondary | ICD-10-CM | POA: Diagnosis not present

## 2019-12-27 DIAGNOSIS — D631 Anemia in chronic kidney disease: Secondary | ICD-10-CM | POA: Diagnosis not present

## 2019-12-27 DIAGNOSIS — Z992 Dependence on renal dialysis: Secondary | ICD-10-CM | POA: Diagnosis not present

## 2019-12-27 DIAGNOSIS — N186 End stage renal disease: Secondary | ICD-10-CM | POA: Diagnosis not present

## 2019-12-27 DIAGNOSIS — N2581 Secondary hyperparathyroidism of renal origin: Secondary | ICD-10-CM | POA: Diagnosis not present

## 2019-12-30 DIAGNOSIS — J158 Pneumonia due to other specified bacteria: Secondary | ICD-10-CM | POA: Diagnosis not present

## 2019-12-30 DIAGNOSIS — N186 End stage renal disease: Secondary | ICD-10-CM | POA: Diagnosis not present

## 2019-12-30 DIAGNOSIS — D509 Iron deficiency anemia, unspecified: Secondary | ICD-10-CM | POA: Diagnosis not present

## 2019-12-30 DIAGNOSIS — N2581 Secondary hyperparathyroidism of renal origin: Secondary | ICD-10-CM | POA: Diagnosis not present

## 2019-12-30 DIAGNOSIS — Z992 Dependence on renal dialysis: Secondary | ICD-10-CM | POA: Diagnosis not present

## 2019-12-30 DIAGNOSIS — D631 Anemia in chronic kidney disease: Secondary | ICD-10-CM | POA: Diagnosis not present

## 2020-01-02 DIAGNOSIS — D509 Iron deficiency anemia, unspecified: Secondary | ICD-10-CM | POA: Diagnosis not present

## 2020-01-02 DIAGNOSIS — D631 Anemia in chronic kidney disease: Secondary | ICD-10-CM | POA: Diagnosis not present

## 2020-01-02 DIAGNOSIS — N186 End stage renal disease: Secondary | ICD-10-CM | POA: Diagnosis not present

## 2020-01-02 DIAGNOSIS — N2581 Secondary hyperparathyroidism of renal origin: Secondary | ICD-10-CM | POA: Diagnosis not present

## 2020-01-02 DIAGNOSIS — J158 Pneumonia due to other specified bacteria: Secondary | ICD-10-CM | POA: Diagnosis not present

## 2020-01-02 DIAGNOSIS — Z992 Dependence on renal dialysis: Secondary | ICD-10-CM | POA: Diagnosis not present

## 2020-01-04 DIAGNOSIS — N2581 Secondary hyperparathyroidism of renal origin: Secondary | ICD-10-CM | POA: Diagnosis not present

## 2020-01-04 DIAGNOSIS — N186 End stage renal disease: Secondary | ICD-10-CM | POA: Diagnosis not present

## 2020-01-04 DIAGNOSIS — D509 Iron deficiency anemia, unspecified: Secondary | ICD-10-CM | POA: Diagnosis not present

## 2020-01-04 DIAGNOSIS — Z992 Dependence on renal dialysis: Secondary | ICD-10-CM | POA: Diagnosis not present

## 2020-01-04 DIAGNOSIS — D631 Anemia in chronic kidney disease: Secondary | ICD-10-CM | POA: Diagnosis not present

## 2020-01-04 DIAGNOSIS — I12 Hypertensive chronic kidney disease with 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: Secondary | ICD-10-CM | POA: Diagnosis not present

## 2020-01-04 DIAGNOSIS — J158 Pneumonia due to other specified bacteria: Secondary | ICD-10-CM | POA: Diagnosis not present

## 2020-01-06 DIAGNOSIS — N2581 Secondary hyperparathyroidism of renal origin: Secondary | ICD-10-CM | POA: Diagnosis not present

## 2020-01-06 DIAGNOSIS — J158 Pneumonia due to other specified bacteria: Secondary | ICD-10-CM | POA: Diagnosis not present

## 2020-01-06 DIAGNOSIS — N186 End stage renal disease: Secondary | ICD-10-CM | POA: Diagnosis not present

## 2020-01-06 DIAGNOSIS — D509 Iron deficiency anemia, unspecified: Secondary | ICD-10-CM | POA: Diagnosis not present

## 2020-01-06 DIAGNOSIS — D631 Anemia in chronic kidney disease: Secondary | ICD-10-CM | POA: Diagnosis not present

## 2020-01-06 DIAGNOSIS — Z992 Dependence on renal dialysis: Secondary | ICD-10-CM | POA: Diagnosis not present

## 2020-01-09 DIAGNOSIS — N186 End stage renal disease: Secondary | ICD-10-CM | POA: Diagnosis not present

## 2020-01-09 DIAGNOSIS — Z992 Dependence on renal dialysis: Secondary | ICD-10-CM | POA: Diagnosis not present

## 2020-01-09 DIAGNOSIS — N2581 Secondary hyperparathyroidism of renal origin: Secondary | ICD-10-CM | POA: Diagnosis not present

## 2020-01-09 DIAGNOSIS — D509 Iron deficiency anemia, unspecified: Secondary | ICD-10-CM | POA: Diagnosis not present

## 2020-01-09 DIAGNOSIS — D631 Anemia in chronic kidney disease: Secondary | ICD-10-CM | POA: Diagnosis not present

## 2020-01-09 DIAGNOSIS — J158 Pneumonia due to other specified bacteria: Secondary | ICD-10-CM | POA: Diagnosis not present

## 2020-01-11 DIAGNOSIS — Z992 Dependence on renal dialysis: Secondary | ICD-10-CM | POA: Diagnosis not present

## 2020-01-11 DIAGNOSIS — N2581 Secondary hyperparathyroidism of renal origin: Secondary | ICD-10-CM | POA: Diagnosis not present

## 2020-01-11 DIAGNOSIS — J158 Pneumonia due to other specified bacteria: Secondary | ICD-10-CM | POA: Diagnosis not present

## 2020-01-11 DIAGNOSIS — D631 Anemia in chronic kidney disease: Secondary | ICD-10-CM | POA: Diagnosis not present

## 2020-01-11 DIAGNOSIS — N186 End stage renal disease: Secondary | ICD-10-CM | POA: Diagnosis not present

## 2020-01-11 DIAGNOSIS — D509 Iron deficiency anemia, unspecified: Secondary | ICD-10-CM | POA: Diagnosis not present

## 2020-01-13 DIAGNOSIS — N2581 Secondary hyperparathyroidism of renal origin: Secondary | ICD-10-CM | POA: Diagnosis not present

## 2020-01-13 DIAGNOSIS — D509 Iron deficiency anemia, unspecified: Secondary | ICD-10-CM | POA: Diagnosis not present

## 2020-01-13 DIAGNOSIS — Z992 Dependence on renal dialysis: Secondary | ICD-10-CM | POA: Diagnosis not present

## 2020-01-13 DIAGNOSIS — D631 Anemia in chronic kidney disease: Secondary | ICD-10-CM | POA: Diagnosis not present

## 2020-01-13 DIAGNOSIS — J158 Pneumonia due to other specified bacteria: Secondary | ICD-10-CM | POA: Diagnosis not present

## 2020-01-13 DIAGNOSIS — N186 End stage renal disease: Secondary | ICD-10-CM | POA: Diagnosis not present

## 2020-01-16 DIAGNOSIS — N2581 Secondary hyperparathyroidism of renal origin: Secondary | ICD-10-CM | POA: Diagnosis not present

## 2020-01-16 DIAGNOSIS — N186 End stage renal disease: Secondary | ICD-10-CM | POA: Diagnosis not present

## 2020-01-16 DIAGNOSIS — D509 Iron deficiency anemia, unspecified: Secondary | ICD-10-CM | POA: Diagnosis not present

## 2020-01-16 DIAGNOSIS — D631 Anemia in chronic kidney disease: Secondary | ICD-10-CM | POA: Diagnosis not present

## 2020-01-16 DIAGNOSIS — Z992 Dependence on renal dialysis: Secondary | ICD-10-CM | POA: Diagnosis not present

## 2020-01-16 DIAGNOSIS — J158 Pneumonia due to other specified bacteria: Secondary | ICD-10-CM | POA: Diagnosis not present

## 2020-01-18 DIAGNOSIS — N186 End stage renal disease: Secondary | ICD-10-CM | POA: Diagnosis not present

## 2020-01-18 DIAGNOSIS — Z992 Dependence on renal dialysis: Secondary | ICD-10-CM | POA: Diagnosis not present

## 2020-01-18 DIAGNOSIS — D631 Anemia in chronic kidney disease: Secondary | ICD-10-CM | POA: Diagnosis not present

## 2020-01-18 DIAGNOSIS — N2581 Secondary hyperparathyroidism of renal origin: Secondary | ICD-10-CM | POA: Diagnosis not present

## 2020-01-18 DIAGNOSIS — J158 Pneumonia due to other specified bacteria: Secondary | ICD-10-CM | POA: Diagnosis not present

## 2020-01-18 DIAGNOSIS — D509 Iron deficiency anemia, unspecified: Secondary | ICD-10-CM | POA: Diagnosis not present

## 2020-01-20 DIAGNOSIS — N2581 Secondary hyperparathyroidism of renal origin: Secondary | ICD-10-CM | POA: Diagnosis not present

## 2020-01-20 DIAGNOSIS — D509 Iron deficiency anemia, unspecified: Secondary | ICD-10-CM | POA: Diagnosis not present

## 2020-01-20 DIAGNOSIS — J158 Pneumonia due to other specified bacteria: Secondary | ICD-10-CM | POA: Diagnosis not present

## 2020-01-20 DIAGNOSIS — Z992 Dependence on renal dialysis: Secondary | ICD-10-CM | POA: Diagnosis not present

## 2020-01-20 DIAGNOSIS — N186 End stage renal disease: Secondary | ICD-10-CM | POA: Diagnosis not present

## 2020-01-20 DIAGNOSIS — D631 Anemia in chronic kidney disease: Secondary | ICD-10-CM | POA: Diagnosis not present

## 2020-01-23 DIAGNOSIS — D509 Iron deficiency anemia, unspecified: Secondary | ICD-10-CM | POA: Diagnosis not present

## 2020-01-23 DIAGNOSIS — Z992 Dependence on renal dialysis: Secondary | ICD-10-CM | POA: Diagnosis not present

## 2020-01-23 DIAGNOSIS — J158 Pneumonia due to other specified bacteria: Secondary | ICD-10-CM | POA: Diagnosis not present

## 2020-01-23 DIAGNOSIS — D631 Anemia in chronic kidney disease: Secondary | ICD-10-CM | POA: Diagnosis not present

## 2020-01-23 DIAGNOSIS — N186 End stage renal disease: Secondary | ICD-10-CM | POA: Diagnosis not present

## 2020-01-23 DIAGNOSIS — N2581 Secondary hyperparathyroidism of renal origin: Secondary | ICD-10-CM | POA: Diagnosis not present

## 2020-01-25 DIAGNOSIS — D509 Iron deficiency anemia, unspecified: Secondary | ICD-10-CM | POA: Diagnosis not present

## 2020-01-25 DIAGNOSIS — D631 Anemia in chronic kidney disease: Secondary | ICD-10-CM | POA: Diagnosis not present

## 2020-01-25 DIAGNOSIS — Z992 Dependence on renal dialysis: Secondary | ICD-10-CM | POA: Diagnosis not present

## 2020-01-25 DIAGNOSIS — J158 Pneumonia due to other specified bacteria: Secondary | ICD-10-CM | POA: Diagnosis not present

## 2020-01-25 DIAGNOSIS — N186 End stage renal disease: Secondary | ICD-10-CM | POA: Diagnosis not present

## 2020-01-25 DIAGNOSIS — N2581 Secondary hyperparathyroidism of renal origin: Secondary | ICD-10-CM | POA: Diagnosis not present

## 2020-01-27 DIAGNOSIS — D509 Iron deficiency anemia, unspecified: Secondary | ICD-10-CM | POA: Diagnosis not present

## 2020-01-27 DIAGNOSIS — Z992 Dependence on renal dialysis: Secondary | ICD-10-CM | POA: Diagnosis not present

## 2020-01-27 DIAGNOSIS — N2581 Secondary hyperparathyroidism of renal origin: Secondary | ICD-10-CM | POA: Diagnosis not present

## 2020-01-27 DIAGNOSIS — D631 Anemia in chronic kidney disease: Secondary | ICD-10-CM | POA: Diagnosis not present

## 2020-01-27 DIAGNOSIS — J158 Pneumonia due to other specified bacteria: Secondary | ICD-10-CM | POA: Diagnosis not present

## 2020-01-27 DIAGNOSIS — N186 End stage renal disease: Secondary | ICD-10-CM | POA: Diagnosis not present

## 2020-01-30 DIAGNOSIS — N186 End stage renal disease: Secondary | ICD-10-CM | POA: Diagnosis not present

## 2020-01-30 DIAGNOSIS — J158 Pneumonia due to other specified bacteria: Secondary | ICD-10-CM | POA: Diagnosis not present

## 2020-01-30 DIAGNOSIS — D631 Anemia in chronic kidney disease: Secondary | ICD-10-CM | POA: Diagnosis not present

## 2020-01-30 DIAGNOSIS — N2581 Secondary hyperparathyroidism of renal origin: Secondary | ICD-10-CM | POA: Diagnosis not present

## 2020-01-30 DIAGNOSIS — D509 Iron deficiency anemia, unspecified: Secondary | ICD-10-CM | POA: Diagnosis not present

## 2020-01-30 DIAGNOSIS — Z992 Dependence on renal dialysis: Secondary | ICD-10-CM | POA: Diagnosis not present

## 2020-02-01 DIAGNOSIS — D509 Iron deficiency anemia, unspecified: Secondary | ICD-10-CM | POA: Diagnosis not present

## 2020-02-01 DIAGNOSIS — Z992 Dependence on renal dialysis: Secondary | ICD-10-CM | POA: Diagnosis not present

## 2020-02-01 DIAGNOSIS — N186 End stage renal disease: Secondary | ICD-10-CM | POA: Diagnosis not present

## 2020-02-01 DIAGNOSIS — N2581 Secondary hyperparathyroidism of renal origin: Secondary | ICD-10-CM | POA: Diagnosis not present

## 2020-02-01 DIAGNOSIS — J158 Pneumonia due to other specified bacteria: Secondary | ICD-10-CM | POA: Diagnosis not present

## 2020-02-01 DIAGNOSIS — D631 Anemia in chronic kidney disease: Secondary | ICD-10-CM | POA: Diagnosis not present

## 2020-02-03 DIAGNOSIS — Z992 Dependence on renal dialysis: Secondary | ICD-10-CM | POA: Diagnosis not present

## 2020-02-03 DIAGNOSIS — N2581 Secondary hyperparathyroidism of renal origin: Secondary | ICD-10-CM | POA: Diagnosis not present

## 2020-02-03 DIAGNOSIS — D509 Iron deficiency anemia, unspecified: Secondary | ICD-10-CM | POA: Diagnosis not present

## 2020-02-03 DIAGNOSIS — J158 Pneumonia due to other specified bacteria: Secondary | ICD-10-CM | POA: Diagnosis not present

## 2020-02-03 DIAGNOSIS — N186 End stage renal disease: Secondary | ICD-10-CM | POA: Diagnosis not present

## 2020-02-03 DIAGNOSIS — D631 Anemia in chronic kidney disease: Secondary | ICD-10-CM | POA: Diagnosis not present

## 2020-02-04 DIAGNOSIS — I12 Hypertensive chronic kidney disease with 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: Secondary | ICD-10-CM | POA: Diagnosis not present

## 2020-02-04 DIAGNOSIS — Z992 Dependence on renal dialysis: Secondary | ICD-10-CM | POA: Diagnosis not present

## 2020-02-04 DIAGNOSIS — N186 End stage renal disease: Secondary | ICD-10-CM | POA: Diagnosis not present

## 2020-02-06 DIAGNOSIS — D509 Iron deficiency anemia, unspecified: Secondary | ICD-10-CM | POA: Diagnosis not present

## 2020-02-06 DIAGNOSIS — J158 Pneumonia due to other specified bacteria: Secondary | ICD-10-CM | POA: Diagnosis not present

## 2020-02-06 DIAGNOSIS — N186 End stage renal disease: Secondary | ICD-10-CM | POA: Diagnosis not present

## 2020-02-06 DIAGNOSIS — Z992 Dependence on renal dialysis: Secondary | ICD-10-CM | POA: Diagnosis not present

## 2020-02-06 DIAGNOSIS — N2581 Secondary hyperparathyroidism of renal origin: Secondary | ICD-10-CM | POA: Diagnosis not present

## 2020-02-06 DIAGNOSIS — D631 Anemia in chronic kidney disease: Secondary | ICD-10-CM | POA: Diagnosis not present

## 2020-02-08 DIAGNOSIS — N186 End stage renal disease: Secondary | ICD-10-CM | POA: Diagnosis not present

## 2020-02-08 DIAGNOSIS — D509 Iron deficiency anemia, unspecified: Secondary | ICD-10-CM | POA: Diagnosis not present

## 2020-02-08 DIAGNOSIS — Z992 Dependence on renal dialysis: Secondary | ICD-10-CM | POA: Diagnosis not present

## 2020-02-08 DIAGNOSIS — N2581 Secondary hyperparathyroidism of renal origin: Secondary | ICD-10-CM | POA: Diagnosis not present

## 2020-02-08 DIAGNOSIS — J158 Pneumonia due to other specified bacteria: Secondary | ICD-10-CM | POA: Diagnosis not present

## 2020-02-08 DIAGNOSIS — D631 Anemia in chronic kidney disease: Secondary | ICD-10-CM | POA: Diagnosis not present

## 2020-02-10 DIAGNOSIS — Z992 Dependence on renal dialysis: Secondary | ICD-10-CM | POA: Diagnosis not present

## 2020-02-10 DIAGNOSIS — J158 Pneumonia due to other specified bacteria: Secondary | ICD-10-CM | POA: Diagnosis not present

## 2020-02-10 DIAGNOSIS — N186 End stage renal disease: Secondary | ICD-10-CM | POA: Diagnosis not present

## 2020-02-10 DIAGNOSIS — D509 Iron deficiency anemia, unspecified: Secondary | ICD-10-CM | POA: Diagnosis not present

## 2020-02-10 DIAGNOSIS — N2581 Secondary hyperparathyroidism of renal origin: Secondary | ICD-10-CM | POA: Diagnosis not present

## 2020-02-10 DIAGNOSIS — D631 Anemia in chronic kidney disease: Secondary | ICD-10-CM | POA: Diagnosis not present

## 2020-02-13 DIAGNOSIS — N2581 Secondary hyperparathyroidism of renal origin: Secondary | ICD-10-CM | POA: Diagnosis not present

## 2020-02-13 DIAGNOSIS — J158 Pneumonia due to other specified bacteria: Secondary | ICD-10-CM | POA: Diagnosis not present

## 2020-02-13 DIAGNOSIS — D631 Anemia in chronic kidney disease: Secondary | ICD-10-CM | POA: Diagnosis not present

## 2020-02-13 DIAGNOSIS — Z992 Dependence on renal dialysis: Secondary | ICD-10-CM | POA: Diagnosis not present

## 2020-02-13 DIAGNOSIS — N186 End stage renal disease: Secondary | ICD-10-CM | POA: Diagnosis not present

## 2020-02-13 DIAGNOSIS — D509 Iron deficiency anemia, unspecified: Secondary | ICD-10-CM | POA: Diagnosis not present

## 2020-02-15 DIAGNOSIS — N186 End stage renal disease: Secondary | ICD-10-CM | POA: Diagnosis not present

## 2020-02-15 DIAGNOSIS — D631 Anemia in chronic kidney disease: Secondary | ICD-10-CM | POA: Diagnosis not present

## 2020-02-15 DIAGNOSIS — N2581 Secondary hyperparathyroidism of renal origin: Secondary | ICD-10-CM | POA: Diagnosis not present

## 2020-02-15 DIAGNOSIS — Z992 Dependence on renal dialysis: Secondary | ICD-10-CM | POA: Diagnosis not present

## 2020-02-15 DIAGNOSIS — D509 Iron deficiency anemia, unspecified: Secondary | ICD-10-CM | POA: Diagnosis not present

## 2020-02-15 DIAGNOSIS — J158 Pneumonia due to other specified bacteria: Secondary | ICD-10-CM | POA: Diagnosis not present

## 2020-02-17 ENCOUNTER — Other Ambulatory Visit: Payer: Self-pay

## 2020-02-17 DIAGNOSIS — N186 End stage renal disease: Secondary | ICD-10-CM | POA: Diagnosis not present

## 2020-02-17 DIAGNOSIS — D509 Iron deficiency anemia, unspecified: Secondary | ICD-10-CM | POA: Diagnosis not present

## 2020-02-17 DIAGNOSIS — J158 Pneumonia due to other specified bacteria: Secondary | ICD-10-CM | POA: Diagnosis not present

## 2020-02-17 DIAGNOSIS — N2581 Secondary hyperparathyroidism of renal origin: Secondary | ICD-10-CM | POA: Diagnosis not present

## 2020-02-17 DIAGNOSIS — D631 Anemia in chronic kidney disease: Secondary | ICD-10-CM | POA: Diagnosis not present

## 2020-02-17 DIAGNOSIS — Z992 Dependence on renal dialysis: Secondary | ICD-10-CM | POA: Diagnosis not present

## 2020-02-17 MED ORDER — METOPROLOL TARTRATE 100 MG PO TABS
ORAL_TABLET | ORAL | 1 refills | Status: DC
Start: 2020-02-17 — End: 2020-11-01

## 2020-02-17 NOTE — Telephone Encounter (Signed)
Pt's medication was sent to pt's pharmacy as requested. Confirmation received.  °

## 2020-02-20 DIAGNOSIS — N186 End stage renal disease: Secondary | ICD-10-CM | POA: Diagnosis not present

## 2020-02-20 DIAGNOSIS — D509 Iron deficiency anemia, unspecified: Secondary | ICD-10-CM | POA: Diagnosis not present

## 2020-02-20 DIAGNOSIS — D631 Anemia in chronic kidney disease: Secondary | ICD-10-CM | POA: Diagnosis not present

## 2020-02-20 DIAGNOSIS — Z992 Dependence on renal dialysis: Secondary | ICD-10-CM | POA: Diagnosis not present

## 2020-02-20 DIAGNOSIS — J158 Pneumonia due to other specified bacteria: Secondary | ICD-10-CM | POA: Diagnosis not present

## 2020-02-20 DIAGNOSIS — N2581 Secondary hyperparathyroidism of renal origin: Secondary | ICD-10-CM | POA: Diagnosis not present

## 2020-02-22 DIAGNOSIS — N2581 Secondary hyperparathyroidism of renal origin: Secondary | ICD-10-CM | POA: Diagnosis not present

## 2020-02-22 DIAGNOSIS — J158 Pneumonia due to other specified bacteria: Secondary | ICD-10-CM | POA: Diagnosis not present

## 2020-02-22 DIAGNOSIS — D631 Anemia in chronic kidney disease: Secondary | ICD-10-CM | POA: Diagnosis not present

## 2020-02-22 DIAGNOSIS — Z992 Dependence on renal dialysis: Secondary | ICD-10-CM | POA: Diagnosis not present

## 2020-02-22 DIAGNOSIS — D509 Iron deficiency anemia, unspecified: Secondary | ICD-10-CM | POA: Diagnosis not present

## 2020-02-22 DIAGNOSIS — N186 End stage renal disease: Secondary | ICD-10-CM | POA: Diagnosis not present

## 2020-02-24 DIAGNOSIS — N2581 Secondary hyperparathyroidism of renal origin: Secondary | ICD-10-CM | POA: Diagnosis not present

## 2020-02-24 DIAGNOSIS — Z992 Dependence on renal dialysis: Secondary | ICD-10-CM | POA: Diagnosis not present

## 2020-02-24 DIAGNOSIS — N186 End stage renal disease: Secondary | ICD-10-CM | POA: Diagnosis not present

## 2020-02-24 DIAGNOSIS — D631 Anemia in chronic kidney disease: Secondary | ICD-10-CM | POA: Diagnosis not present

## 2020-02-24 DIAGNOSIS — J158 Pneumonia due to other specified bacteria: Secondary | ICD-10-CM | POA: Diagnosis not present

## 2020-02-24 DIAGNOSIS — D509 Iron deficiency anemia, unspecified: Secondary | ICD-10-CM | POA: Diagnosis not present

## 2020-02-27 DIAGNOSIS — D631 Anemia in chronic kidney disease: Secondary | ICD-10-CM | POA: Diagnosis not present

## 2020-02-27 DIAGNOSIS — D509 Iron deficiency anemia, unspecified: Secondary | ICD-10-CM | POA: Diagnosis not present

## 2020-02-27 DIAGNOSIS — N2581 Secondary hyperparathyroidism of renal origin: Secondary | ICD-10-CM | POA: Diagnosis not present

## 2020-02-27 DIAGNOSIS — Z992 Dependence on renal dialysis: Secondary | ICD-10-CM | POA: Diagnosis not present

## 2020-02-27 DIAGNOSIS — J158 Pneumonia due to other specified bacteria: Secondary | ICD-10-CM | POA: Diagnosis not present

## 2020-02-27 DIAGNOSIS — N186 End stage renal disease: Secondary | ICD-10-CM | POA: Diagnosis not present

## 2020-02-29 DIAGNOSIS — D509 Iron deficiency anemia, unspecified: Secondary | ICD-10-CM | POA: Diagnosis not present

## 2020-02-29 DIAGNOSIS — N186 End stage renal disease: Secondary | ICD-10-CM | POA: Diagnosis not present

## 2020-02-29 DIAGNOSIS — Z992 Dependence on renal dialysis: Secondary | ICD-10-CM | POA: Diagnosis not present

## 2020-02-29 DIAGNOSIS — D631 Anemia in chronic kidney disease: Secondary | ICD-10-CM | POA: Diagnosis not present

## 2020-02-29 DIAGNOSIS — N2581 Secondary hyperparathyroidism of renal origin: Secondary | ICD-10-CM | POA: Diagnosis not present

## 2020-02-29 DIAGNOSIS — J158 Pneumonia due to other specified bacteria: Secondary | ICD-10-CM | POA: Diagnosis not present

## 2020-03-02 DIAGNOSIS — D631 Anemia in chronic kidney disease: Secondary | ICD-10-CM | POA: Diagnosis not present

## 2020-03-02 DIAGNOSIS — N2581 Secondary hyperparathyroidism of renal origin: Secondary | ICD-10-CM | POA: Diagnosis not present

## 2020-03-02 DIAGNOSIS — Z992 Dependence on renal dialysis: Secondary | ICD-10-CM | POA: Diagnosis not present

## 2020-03-02 DIAGNOSIS — N186 End stage renal disease: Secondary | ICD-10-CM | POA: Diagnosis not present

## 2020-03-02 DIAGNOSIS — D509 Iron deficiency anemia, unspecified: Secondary | ICD-10-CM | POA: Diagnosis not present

## 2020-03-02 DIAGNOSIS — J158 Pneumonia due to other specified bacteria: Secondary | ICD-10-CM | POA: Diagnosis not present

## 2020-03-05 DIAGNOSIS — J158 Pneumonia due to other specified bacteria: Secondary | ICD-10-CM | POA: Diagnosis not present

## 2020-03-05 DIAGNOSIS — Z992 Dependence on renal dialysis: Secondary | ICD-10-CM | POA: Diagnosis not present

## 2020-03-05 DIAGNOSIS — N186 End stage renal disease: Secondary | ICD-10-CM | POA: Diagnosis not present

## 2020-03-05 DIAGNOSIS — N2581 Secondary hyperparathyroidism of renal origin: Secondary | ICD-10-CM | POA: Diagnosis not present

## 2020-03-05 DIAGNOSIS — D631 Anemia in chronic kidney disease: Secondary | ICD-10-CM | POA: Diagnosis not present

## 2020-03-05 DIAGNOSIS — D509 Iron deficiency anemia, unspecified: Secondary | ICD-10-CM | POA: Diagnosis not present

## 2020-03-06 DIAGNOSIS — N186 End stage renal disease: Secondary | ICD-10-CM | POA: Diagnosis not present

## 2020-03-06 DIAGNOSIS — Z992 Dependence on renal dialysis: Secondary | ICD-10-CM | POA: Diagnosis not present

## 2020-03-06 DIAGNOSIS — I12 Hypertensive chronic kidney disease with 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: Secondary | ICD-10-CM | POA: Diagnosis not present

## 2020-03-07 DIAGNOSIS — D631 Anemia in chronic kidney disease: Secondary | ICD-10-CM | POA: Diagnosis not present

## 2020-03-07 DIAGNOSIS — D509 Iron deficiency anemia, unspecified: Secondary | ICD-10-CM | POA: Diagnosis not present

## 2020-03-07 DIAGNOSIS — Z992 Dependence on renal dialysis: Secondary | ICD-10-CM | POA: Diagnosis not present

## 2020-03-07 DIAGNOSIS — N186 End stage renal disease: Secondary | ICD-10-CM | POA: Diagnosis not present

## 2020-03-07 DIAGNOSIS — J158 Pneumonia due to other specified bacteria: Secondary | ICD-10-CM | POA: Diagnosis not present

## 2020-03-07 DIAGNOSIS — N2581 Secondary hyperparathyroidism of renal origin: Secondary | ICD-10-CM | POA: Diagnosis not present

## 2020-03-09 DIAGNOSIS — D509 Iron deficiency anemia, unspecified: Secondary | ICD-10-CM | POA: Diagnosis not present

## 2020-03-09 DIAGNOSIS — J158 Pneumonia due to other specified bacteria: Secondary | ICD-10-CM | POA: Diagnosis not present

## 2020-03-09 DIAGNOSIS — Z992 Dependence on renal dialysis: Secondary | ICD-10-CM | POA: Diagnosis not present

## 2020-03-09 DIAGNOSIS — D631 Anemia in chronic kidney disease: Secondary | ICD-10-CM | POA: Diagnosis not present

## 2020-03-09 DIAGNOSIS — N186 End stage renal disease: Secondary | ICD-10-CM | POA: Diagnosis not present

## 2020-03-09 DIAGNOSIS — N2581 Secondary hyperparathyroidism of renal origin: Secondary | ICD-10-CM | POA: Diagnosis not present

## 2020-03-12 DIAGNOSIS — N2581 Secondary hyperparathyroidism of renal origin: Secondary | ICD-10-CM | POA: Diagnosis not present

## 2020-03-12 DIAGNOSIS — N186 End stage renal disease: Secondary | ICD-10-CM | POA: Diagnosis not present

## 2020-03-12 DIAGNOSIS — D631 Anemia in chronic kidney disease: Secondary | ICD-10-CM | POA: Diagnosis not present

## 2020-03-12 DIAGNOSIS — J158 Pneumonia due to other specified bacteria: Secondary | ICD-10-CM | POA: Diagnosis not present

## 2020-03-12 DIAGNOSIS — D509 Iron deficiency anemia, unspecified: Secondary | ICD-10-CM | POA: Diagnosis not present

## 2020-03-12 DIAGNOSIS — Z992 Dependence on renal dialysis: Secondary | ICD-10-CM | POA: Diagnosis not present

## 2020-03-14 DIAGNOSIS — N2581 Secondary hyperparathyroidism of renal origin: Secondary | ICD-10-CM | POA: Diagnosis not present

## 2020-03-14 DIAGNOSIS — N186 End stage renal disease: Secondary | ICD-10-CM | POA: Diagnosis not present

## 2020-03-14 DIAGNOSIS — D631 Anemia in chronic kidney disease: Secondary | ICD-10-CM | POA: Diagnosis not present

## 2020-03-14 DIAGNOSIS — D509 Iron deficiency anemia, unspecified: Secondary | ICD-10-CM | POA: Diagnosis not present

## 2020-03-14 DIAGNOSIS — J158 Pneumonia due to other specified bacteria: Secondary | ICD-10-CM | POA: Diagnosis not present

## 2020-03-14 DIAGNOSIS — Z992 Dependence on renal dialysis: Secondary | ICD-10-CM | POA: Diagnosis not present

## 2020-03-16 DIAGNOSIS — D631 Anemia in chronic kidney disease: Secondary | ICD-10-CM | POA: Diagnosis not present

## 2020-03-16 DIAGNOSIS — N186 End stage renal disease: Secondary | ICD-10-CM | POA: Diagnosis not present

## 2020-03-16 DIAGNOSIS — J158 Pneumonia due to other specified bacteria: Secondary | ICD-10-CM | POA: Diagnosis not present

## 2020-03-16 DIAGNOSIS — D509 Iron deficiency anemia, unspecified: Secondary | ICD-10-CM | POA: Diagnosis not present

## 2020-03-16 DIAGNOSIS — N2581 Secondary hyperparathyroidism of renal origin: Secondary | ICD-10-CM | POA: Diagnosis not present

## 2020-03-16 DIAGNOSIS — Z992 Dependence on renal dialysis: Secondary | ICD-10-CM | POA: Diagnosis not present

## 2020-03-19 DIAGNOSIS — J158 Pneumonia due to other specified bacteria: Secondary | ICD-10-CM | POA: Diagnosis not present

## 2020-03-19 DIAGNOSIS — N2581 Secondary hyperparathyroidism of renal origin: Secondary | ICD-10-CM | POA: Diagnosis not present

## 2020-03-19 DIAGNOSIS — D509 Iron deficiency anemia, unspecified: Secondary | ICD-10-CM | POA: Diagnosis not present

## 2020-03-19 DIAGNOSIS — Z992 Dependence on renal dialysis: Secondary | ICD-10-CM | POA: Diagnosis not present

## 2020-03-19 DIAGNOSIS — D631 Anemia in chronic kidney disease: Secondary | ICD-10-CM | POA: Diagnosis not present

## 2020-03-19 DIAGNOSIS — N186 End stage renal disease: Secondary | ICD-10-CM | POA: Diagnosis not present

## 2020-03-21 DIAGNOSIS — D509 Iron deficiency anemia, unspecified: Secondary | ICD-10-CM | POA: Diagnosis not present

## 2020-03-21 DIAGNOSIS — N186 End stage renal disease: Secondary | ICD-10-CM | POA: Diagnosis not present

## 2020-03-21 DIAGNOSIS — Z992 Dependence on renal dialysis: Secondary | ICD-10-CM | POA: Diagnosis not present

## 2020-03-21 DIAGNOSIS — D631 Anemia in chronic kidney disease: Secondary | ICD-10-CM | POA: Diagnosis not present

## 2020-03-21 DIAGNOSIS — N2581 Secondary hyperparathyroidism of renal origin: Secondary | ICD-10-CM | POA: Diagnosis not present

## 2020-03-21 DIAGNOSIS — J158 Pneumonia due to other specified bacteria: Secondary | ICD-10-CM | POA: Diagnosis not present

## 2020-03-23 DIAGNOSIS — D509 Iron deficiency anemia, unspecified: Secondary | ICD-10-CM | POA: Diagnosis not present

## 2020-03-23 DIAGNOSIS — N2581 Secondary hyperparathyroidism of renal origin: Secondary | ICD-10-CM | POA: Diagnosis not present

## 2020-03-23 DIAGNOSIS — D631 Anemia in chronic kidney disease: Secondary | ICD-10-CM | POA: Diagnosis not present

## 2020-03-23 DIAGNOSIS — N186 End stage renal disease: Secondary | ICD-10-CM | POA: Diagnosis not present

## 2020-03-23 DIAGNOSIS — Z992 Dependence on renal dialysis: Secondary | ICD-10-CM | POA: Diagnosis not present

## 2020-03-23 DIAGNOSIS — J158 Pneumonia due to other specified bacteria: Secondary | ICD-10-CM | POA: Diagnosis not present

## 2020-03-26 DIAGNOSIS — D509 Iron deficiency anemia, unspecified: Secondary | ICD-10-CM | POA: Diagnosis not present

## 2020-03-26 DIAGNOSIS — J158 Pneumonia due to other specified bacteria: Secondary | ICD-10-CM | POA: Diagnosis not present

## 2020-03-26 DIAGNOSIS — N2581 Secondary hyperparathyroidism of renal origin: Secondary | ICD-10-CM | POA: Diagnosis not present

## 2020-03-26 DIAGNOSIS — Z992 Dependence on renal dialysis: Secondary | ICD-10-CM | POA: Diagnosis not present

## 2020-03-26 DIAGNOSIS — N186 End stage renal disease: Secondary | ICD-10-CM | POA: Diagnosis not present

## 2020-03-26 DIAGNOSIS — D631 Anemia in chronic kidney disease: Secondary | ICD-10-CM | POA: Diagnosis not present

## 2020-03-28 DIAGNOSIS — N186 End stage renal disease: Secondary | ICD-10-CM | POA: Diagnosis not present

## 2020-03-28 DIAGNOSIS — D631 Anemia in chronic kidney disease: Secondary | ICD-10-CM | POA: Diagnosis not present

## 2020-03-28 DIAGNOSIS — N2581 Secondary hyperparathyroidism of renal origin: Secondary | ICD-10-CM | POA: Diagnosis not present

## 2020-03-28 DIAGNOSIS — D509 Iron deficiency anemia, unspecified: Secondary | ICD-10-CM | POA: Diagnosis not present

## 2020-03-28 DIAGNOSIS — J158 Pneumonia due to other specified bacteria: Secondary | ICD-10-CM | POA: Diagnosis not present

## 2020-03-28 DIAGNOSIS — Z992 Dependence on renal dialysis: Secondary | ICD-10-CM | POA: Diagnosis not present

## 2020-03-30 DIAGNOSIS — Z992 Dependence on renal dialysis: Secondary | ICD-10-CM | POA: Diagnosis not present

## 2020-03-30 DIAGNOSIS — D509 Iron deficiency anemia, unspecified: Secondary | ICD-10-CM | POA: Diagnosis not present

## 2020-03-30 DIAGNOSIS — J158 Pneumonia due to other specified bacteria: Secondary | ICD-10-CM | POA: Diagnosis not present

## 2020-03-30 DIAGNOSIS — D631 Anemia in chronic kidney disease: Secondary | ICD-10-CM | POA: Diagnosis not present

## 2020-03-30 DIAGNOSIS — N2581 Secondary hyperparathyroidism of renal origin: Secondary | ICD-10-CM | POA: Diagnosis not present

## 2020-03-30 DIAGNOSIS — N186 End stage renal disease: Secondary | ICD-10-CM | POA: Diagnosis not present

## 2020-04-02 DIAGNOSIS — Z992 Dependence on renal dialysis: Secondary | ICD-10-CM | POA: Diagnosis not present

## 2020-04-02 DIAGNOSIS — D631 Anemia in chronic kidney disease: Secondary | ICD-10-CM | POA: Diagnosis not present

## 2020-04-02 DIAGNOSIS — N2581 Secondary hyperparathyroidism of renal origin: Secondary | ICD-10-CM | POA: Diagnosis not present

## 2020-04-02 DIAGNOSIS — J158 Pneumonia due to other specified bacteria: Secondary | ICD-10-CM | POA: Diagnosis not present

## 2020-04-02 DIAGNOSIS — N186 End stage renal disease: Secondary | ICD-10-CM | POA: Diagnosis not present

## 2020-04-02 DIAGNOSIS — D509 Iron deficiency anemia, unspecified: Secondary | ICD-10-CM | POA: Diagnosis not present

## 2020-04-03 DIAGNOSIS — I12 Hypertensive chronic kidney disease with 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: Secondary | ICD-10-CM | POA: Diagnosis not present

## 2020-04-03 DIAGNOSIS — N186 End stage renal disease: Secondary | ICD-10-CM | POA: Diagnosis not present

## 2020-04-03 DIAGNOSIS — Z992 Dependence on renal dialysis: Secondary | ICD-10-CM | POA: Diagnosis not present

## 2020-04-04 DIAGNOSIS — Z992 Dependence on renal dialysis: Secondary | ICD-10-CM | POA: Diagnosis not present

## 2020-04-04 DIAGNOSIS — N2581 Secondary hyperparathyroidism of renal origin: Secondary | ICD-10-CM | POA: Diagnosis not present

## 2020-04-04 DIAGNOSIS — D509 Iron deficiency anemia, unspecified: Secondary | ICD-10-CM | POA: Diagnosis not present

## 2020-04-04 DIAGNOSIS — D631 Anemia in chronic kidney disease: Secondary | ICD-10-CM | POA: Diagnosis not present

## 2020-04-04 DIAGNOSIS — J158 Pneumonia due to other specified bacteria: Secondary | ICD-10-CM | POA: Diagnosis not present

## 2020-04-04 DIAGNOSIS — N186 End stage renal disease: Secondary | ICD-10-CM | POA: Diagnosis not present

## 2020-04-06 DIAGNOSIS — Z992 Dependence on renal dialysis: Secondary | ICD-10-CM | POA: Diagnosis not present

## 2020-04-06 DIAGNOSIS — D509 Iron deficiency anemia, unspecified: Secondary | ICD-10-CM | POA: Diagnosis not present

## 2020-04-06 DIAGNOSIS — N2581 Secondary hyperparathyroidism of renal origin: Secondary | ICD-10-CM | POA: Diagnosis not present

## 2020-04-06 DIAGNOSIS — N186 End stage renal disease: Secondary | ICD-10-CM | POA: Diagnosis not present

## 2020-04-06 DIAGNOSIS — D631 Anemia in chronic kidney disease: Secondary | ICD-10-CM | POA: Diagnosis not present

## 2020-04-06 DIAGNOSIS — J158 Pneumonia due to other specified bacteria: Secondary | ICD-10-CM | POA: Diagnosis not present

## 2020-04-09 DIAGNOSIS — J158 Pneumonia due to other specified bacteria: Secondary | ICD-10-CM | POA: Diagnosis not present

## 2020-04-09 DIAGNOSIS — D509 Iron deficiency anemia, unspecified: Secondary | ICD-10-CM | POA: Diagnosis not present

## 2020-04-09 DIAGNOSIS — Z992 Dependence on renal dialysis: Secondary | ICD-10-CM | POA: Diagnosis not present

## 2020-04-09 DIAGNOSIS — N2581 Secondary hyperparathyroidism of renal origin: Secondary | ICD-10-CM | POA: Diagnosis not present

## 2020-04-09 DIAGNOSIS — N186 End stage renal disease: Secondary | ICD-10-CM | POA: Diagnosis not present

## 2020-04-09 DIAGNOSIS — D631 Anemia in chronic kidney disease: Secondary | ICD-10-CM | POA: Diagnosis not present

## 2020-04-11 DIAGNOSIS — Z992 Dependence on renal dialysis: Secondary | ICD-10-CM | POA: Diagnosis not present

## 2020-04-11 DIAGNOSIS — N186 End stage renal disease: Secondary | ICD-10-CM | POA: Diagnosis not present

## 2020-04-11 DIAGNOSIS — D509 Iron deficiency anemia, unspecified: Secondary | ICD-10-CM | POA: Diagnosis not present

## 2020-04-11 DIAGNOSIS — N2581 Secondary hyperparathyroidism of renal origin: Secondary | ICD-10-CM | POA: Diagnosis not present

## 2020-04-11 DIAGNOSIS — J158 Pneumonia due to other specified bacteria: Secondary | ICD-10-CM | POA: Diagnosis not present

## 2020-04-11 DIAGNOSIS — D631 Anemia in chronic kidney disease: Secondary | ICD-10-CM | POA: Diagnosis not present

## 2020-04-13 DIAGNOSIS — Z992 Dependence on renal dialysis: Secondary | ICD-10-CM | POA: Diagnosis not present

## 2020-04-13 DIAGNOSIS — N186 End stage renal disease: Secondary | ICD-10-CM | POA: Diagnosis not present

## 2020-04-13 DIAGNOSIS — D631 Anemia in chronic kidney disease: Secondary | ICD-10-CM | POA: Diagnosis not present

## 2020-04-13 DIAGNOSIS — J158 Pneumonia due to other specified bacteria: Secondary | ICD-10-CM | POA: Diagnosis not present

## 2020-04-13 DIAGNOSIS — D509 Iron deficiency anemia, unspecified: Secondary | ICD-10-CM | POA: Diagnosis not present

## 2020-04-13 DIAGNOSIS — N2581 Secondary hyperparathyroidism of renal origin: Secondary | ICD-10-CM | POA: Diagnosis not present

## 2020-04-16 DIAGNOSIS — N186 End stage renal disease: Secondary | ICD-10-CM | POA: Diagnosis not present

## 2020-04-16 DIAGNOSIS — N2581 Secondary hyperparathyroidism of renal origin: Secondary | ICD-10-CM | POA: Diagnosis not present

## 2020-04-16 DIAGNOSIS — D631 Anemia in chronic kidney disease: Secondary | ICD-10-CM | POA: Diagnosis not present

## 2020-04-16 DIAGNOSIS — D509 Iron deficiency anemia, unspecified: Secondary | ICD-10-CM | POA: Diagnosis not present

## 2020-04-16 DIAGNOSIS — Z992 Dependence on renal dialysis: Secondary | ICD-10-CM | POA: Diagnosis not present

## 2020-04-16 DIAGNOSIS — J158 Pneumonia due to other specified bacteria: Secondary | ICD-10-CM | POA: Diagnosis not present

## 2020-04-18 DIAGNOSIS — N186 End stage renal disease: Secondary | ICD-10-CM | POA: Diagnosis not present

## 2020-04-18 DIAGNOSIS — N2581 Secondary hyperparathyroidism of renal origin: Secondary | ICD-10-CM | POA: Diagnosis not present

## 2020-04-18 DIAGNOSIS — Z992 Dependence on renal dialysis: Secondary | ICD-10-CM | POA: Diagnosis not present

## 2020-04-18 DIAGNOSIS — D631 Anemia in chronic kidney disease: Secondary | ICD-10-CM | POA: Diagnosis not present

## 2020-04-18 DIAGNOSIS — D509 Iron deficiency anemia, unspecified: Secondary | ICD-10-CM | POA: Diagnosis not present

## 2020-04-18 DIAGNOSIS — J158 Pneumonia due to other specified bacteria: Secondary | ICD-10-CM | POA: Diagnosis not present

## 2020-04-20 DIAGNOSIS — D631 Anemia in chronic kidney disease: Secondary | ICD-10-CM | POA: Diagnosis not present

## 2020-04-20 DIAGNOSIS — N186 End stage renal disease: Secondary | ICD-10-CM | POA: Diagnosis not present

## 2020-04-20 DIAGNOSIS — Z992 Dependence on renal dialysis: Secondary | ICD-10-CM | POA: Diagnosis not present

## 2020-04-20 DIAGNOSIS — J158 Pneumonia due to other specified bacteria: Secondary | ICD-10-CM | POA: Diagnosis not present

## 2020-04-20 DIAGNOSIS — N2581 Secondary hyperparathyroidism of renal origin: Secondary | ICD-10-CM | POA: Diagnosis not present

## 2020-04-20 DIAGNOSIS — D509 Iron deficiency anemia, unspecified: Secondary | ICD-10-CM | POA: Diagnosis not present

## 2020-04-23 DIAGNOSIS — D631 Anemia in chronic kidney disease: Secondary | ICD-10-CM | POA: Diagnosis not present

## 2020-04-23 DIAGNOSIS — N2581 Secondary hyperparathyroidism of renal origin: Secondary | ICD-10-CM | POA: Diagnosis not present

## 2020-04-23 DIAGNOSIS — Z992 Dependence on renal dialysis: Secondary | ICD-10-CM | POA: Diagnosis not present

## 2020-04-23 DIAGNOSIS — N186 End stage renal disease: Secondary | ICD-10-CM | POA: Diagnosis not present

## 2020-04-23 DIAGNOSIS — D509 Iron deficiency anemia, unspecified: Secondary | ICD-10-CM | POA: Diagnosis not present

## 2020-04-23 DIAGNOSIS — J158 Pneumonia due to other specified bacteria: Secondary | ICD-10-CM | POA: Diagnosis not present

## 2020-04-25 DIAGNOSIS — D631 Anemia in chronic kidney disease: Secondary | ICD-10-CM | POA: Diagnosis not present

## 2020-04-25 DIAGNOSIS — N2581 Secondary hyperparathyroidism of renal origin: Secondary | ICD-10-CM | POA: Diagnosis not present

## 2020-04-25 DIAGNOSIS — J158 Pneumonia due to other specified bacteria: Secondary | ICD-10-CM | POA: Diagnosis not present

## 2020-04-25 DIAGNOSIS — Z992 Dependence on renal dialysis: Secondary | ICD-10-CM | POA: Diagnosis not present

## 2020-04-25 DIAGNOSIS — D509 Iron deficiency anemia, unspecified: Secondary | ICD-10-CM | POA: Diagnosis not present

## 2020-04-25 DIAGNOSIS — N186 End stage renal disease: Secondary | ICD-10-CM | POA: Diagnosis not present

## 2020-04-27 DIAGNOSIS — N186 End stage renal disease: Secondary | ICD-10-CM | POA: Diagnosis not present

## 2020-04-27 DIAGNOSIS — D509 Iron deficiency anemia, unspecified: Secondary | ICD-10-CM | POA: Diagnosis not present

## 2020-04-27 DIAGNOSIS — Z992 Dependence on renal dialysis: Secondary | ICD-10-CM | POA: Diagnosis not present

## 2020-04-27 DIAGNOSIS — D631 Anemia in chronic kidney disease: Secondary | ICD-10-CM | POA: Diagnosis not present

## 2020-04-27 DIAGNOSIS — J158 Pneumonia due to other specified bacteria: Secondary | ICD-10-CM | POA: Diagnosis not present

## 2020-04-27 DIAGNOSIS — N2581 Secondary hyperparathyroidism of renal origin: Secondary | ICD-10-CM | POA: Diagnosis not present

## 2020-04-30 DIAGNOSIS — J158 Pneumonia due to other specified bacteria: Secondary | ICD-10-CM | POA: Diagnosis not present

## 2020-04-30 DIAGNOSIS — N186 End stage renal disease: Secondary | ICD-10-CM | POA: Diagnosis not present

## 2020-04-30 DIAGNOSIS — D509 Iron deficiency anemia, unspecified: Secondary | ICD-10-CM | POA: Diagnosis not present

## 2020-04-30 DIAGNOSIS — Z992 Dependence on renal dialysis: Secondary | ICD-10-CM | POA: Diagnosis not present

## 2020-04-30 DIAGNOSIS — D631 Anemia in chronic kidney disease: Secondary | ICD-10-CM | POA: Diagnosis not present

## 2020-04-30 DIAGNOSIS — N2581 Secondary hyperparathyroidism of renal origin: Secondary | ICD-10-CM | POA: Diagnosis not present

## 2020-05-02 DIAGNOSIS — J158 Pneumonia due to other specified bacteria: Secondary | ICD-10-CM | POA: Diagnosis not present

## 2020-05-02 DIAGNOSIS — D509 Iron deficiency anemia, unspecified: Secondary | ICD-10-CM | POA: Diagnosis not present

## 2020-05-02 DIAGNOSIS — D631 Anemia in chronic kidney disease: Secondary | ICD-10-CM | POA: Diagnosis not present

## 2020-05-02 DIAGNOSIS — N2581 Secondary hyperparathyroidism of renal origin: Secondary | ICD-10-CM | POA: Diagnosis not present

## 2020-05-02 DIAGNOSIS — Z992 Dependence on renal dialysis: Secondary | ICD-10-CM | POA: Diagnosis not present

## 2020-05-02 DIAGNOSIS — N186 End stage renal disease: Secondary | ICD-10-CM | POA: Diagnosis not present

## 2020-05-04 DIAGNOSIS — D509 Iron deficiency anemia, unspecified: Secondary | ICD-10-CM | POA: Diagnosis not present

## 2020-05-04 DIAGNOSIS — N186 End stage renal disease: Secondary | ICD-10-CM | POA: Diagnosis not present

## 2020-05-04 DIAGNOSIS — I12 Hypertensive chronic kidney disease with 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: Secondary | ICD-10-CM | POA: Diagnosis not present

## 2020-05-04 DIAGNOSIS — Z992 Dependence on renal dialysis: Secondary | ICD-10-CM | POA: Diagnosis not present

## 2020-05-04 DIAGNOSIS — J158 Pneumonia due to other specified bacteria: Secondary | ICD-10-CM | POA: Diagnosis not present

## 2020-05-04 DIAGNOSIS — D631 Anemia in chronic kidney disease: Secondary | ICD-10-CM | POA: Diagnosis not present

## 2020-05-04 DIAGNOSIS — N2581 Secondary hyperparathyroidism of renal origin: Secondary | ICD-10-CM | POA: Diagnosis not present

## 2020-05-07 DIAGNOSIS — N2581 Secondary hyperparathyroidism of renal origin: Secondary | ICD-10-CM | POA: Diagnosis not present

## 2020-05-07 DIAGNOSIS — N186 End stage renal disease: Secondary | ICD-10-CM | POA: Diagnosis not present

## 2020-05-07 DIAGNOSIS — J158 Pneumonia due to other specified bacteria: Secondary | ICD-10-CM | POA: Diagnosis not present

## 2020-05-07 DIAGNOSIS — D509 Iron deficiency anemia, unspecified: Secondary | ICD-10-CM | POA: Diagnosis not present

## 2020-05-07 DIAGNOSIS — D631 Anemia in chronic kidney disease: Secondary | ICD-10-CM | POA: Diagnosis not present

## 2020-05-07 DIAGNOSIS — Z992 Dependence on renal dialysis: Secondary | ICD-10-CM | POA: Diagnosis not present

## 2020-05-09 DIAGNOSIS — N186 End stage renal disease: Secondary | ICD-10-CM | POA: Diagnosis not present

## 2020-05-09 DIAGNOSIS — D631 Anemia in chronic kidney disease: Secondary | ICD-10-CM | POA: Diagnosis not present

## 2020-05-09 DIAGNOSIS — D509 Iron deficiency anemia, unspecified: Secondary | ICD-10-CM | POA: Diagnosis not present

## 2020-05-09 DIAGNOSIS — J158 Pneumonia due to other specified bacteria: Secondary | ICD-10-CM | POA: Diagnosis not present

## 2020-05-09 DIAGNOSIS — Z992 Dependence on renal dialysis: Secondary | ICD-10-CM | POA: Diagnosis not present

## 2020-05-09 DIAGNOSIS — N2581 Secondary hyperparathyroidism of renal origin: Secondary | ICD-10-CM | POA: Diagnosis not present

## 2020-05-11 DIAGNOSIS — N186 End stage renal disease: Secondary | ICD-10-CM | POA: Diagnosis not present

## 2020-05-11 DIAGNOSIS — D509 Iron deficiency anemia, unspecified: Secondary | ICD-10-CM | POA: Diagnosis not present

## 2020-05-11 DIAGNOSIS — Z992 Dependence on renal dialysis: Secondary | ICD-10-CM | POA: Diagnosis not present

## 2020-05-11 DIAGNOSIS — J158 Pneumonia due to other specified bacteria: Secondary | ICD-10-CM | POA: Diagnosis not present

## 2020-05-11 DIAGNOSIS — N2581 Secondary hyperparathyroidism of renal origin: Secondary | ICD-10-CM | POA: Diagnosis not present

## 2020-05-11 DIAGNOSIS — D631 Anemia in chronic kidney disease: Secondary | ICD-10-CM | POA: Diagnosis not present

## 2020-05-14 DIAGNOSIS — Z992 Dependence on renal dialysis: Secondary | ICD-10-CM | POA: Diagnosis not present

## 2020-05-14 DIAGNOSIS — N186 End stage renal disease: Secondary | ICD-10-CM | POA: Diagnosis not present

## 2020-05-14 DIAGNOSIS — N2581 Secondary hyperparathyroidism of renal origin: Secondary | ICD-10-CM | POA: Diagnosis not present

## 2020-05-14 DIAGNOSIS — D509 Iron deficiency anemia, unspecified: Secondary | ICD-10-CM | POA: Diagnosis not present

## 2020-05-14 DIAGNOSIS — D631 Anemia in chronic kidney disease: Secondary | ICD-10-CM | POA: Diagnosis not present

## 2020-05-14 DIAGNOSIS — J158 Pneumonia due to other specified bacteria: Secondary | ICD-10-CM | POA: Diagnosis not present

## 2020-05-16 DIAGNOSIS — N2581 Secondary hyperparathyroidism of renal origin: Secondary | ICD-10-CM | POA: Diagnosis not present

## 2020-05-16 DIAGNOSIS — N186 End stage renal disease: Secondary | ICD-10-CM | POA: Diagnosis not present

## 2020-05-16 DIAGNOSIS — D631 Anemia in chronic kidney disease: Secondary | ICD-10-CM | POA: Diagnosis not present

## 2020-05-16 DIAGNOSIS — Z992 Dependence on renal dialysis: Secondary | ICD-10-CM | POA: Diagnosis not present

## 2020-05-16 DIAGNOSIS — J158 Pneumonia due to other specified bacteria: Secondary | ICD-10-CM | POA: Diagnosis not present

## 2020-05-16 DIAGNOSIS — D509 Iron deficiency anemia, unspecified: Secondary | ICD-10-CM | POA: Diagnosis not present

## 2020-05-18 DIAGNOSIS — N2581 Secondary hyperparathyroidism of renal origin: Secondary | ICD-10-CM | POA: Diagnosis not present

## 2020-05-18 DIAGNOSIS — J158 Pneumonia due to other specified bacteria: Secondary | ICD-10-CM | POA: Diagnosis not present

## 2020-05-18 DIAGNOSIS — D509 Iron deficiency anemia, unspecified: Secondary | ICD-10-CM | POA: Diagnosis not present

## 2020-05-18 DIAGNOSIS — D631 Anemia in chronic kidney disease: Secondary | ICD-10-CM | POA: Diagnosis not present

## 2020-05-18 DIAGNOSIS — N186 End stage renal disease: Secondary | ICD-10-CM | POA: Diagnosis not present

## 2020-05-18 DIAGNOSIS — Z992 Dependence on renal dialysis: Secondary | ICD-10-CM | POA: Diagnosis not present

## 2020-05-21 DIAGNOSIS — D509 Iron deficiency anemia, unspecified: Secondary | ICD-10-CM | POA: Diagnosis not present

## 2020-05-21 DIAGNOSIS — N186 End stage renal disease: Secondary | ICD-10-CM | POA: Diagnosis not present

## 2020-05-21 DIAGNOSIS — N2581 Secondary hyperparathyroidism of renal origin: Secondary | ICD-10-CM | POA: Diagnosis not present

## 2020-05-21 DIAGNOSIS — D631 Anemia in chronic kidney disease: Secondary | ICD-10-CM | POA: Diagnosis not present

## 2020-05-21 DIAGNOSIS — Z992 Dependence on renal dialysis: Secondary | ICD-10-CM | POA: Diagnosis not present

## 2020-05-21 DIAGNOSIS — J158 Pneumonia due to other specified bacteria: Secondary | ICD-10-CM | POA: Diagnosis not present

## 2020-05-23 DIAGNOSIS — N186 End stage renal disease: Secondary | ICD-10-CM | POA: Diagnosis not present

## 2020-05-23 DIAGNOSIS — N2581 Secondary hyperparathyroidism of renal origin: Secondary | ICD-10-CM | POA: Diagnosis not present

## 2020-05-23 DIAGNOSIS — D631 Anemia in chronic kidney disease: Secondary | ICD-10-CM | POA: Diagnosis not present

## 2020-05-23 DIAGNOSIS — Z992 Dependence on renal dialysis: Secondary | ICD-10-CM | POA: Diagnosis not present

## 2020-05-23 DIAGNOSIS — J158 Pneumonia due to other specified bacteria: Secondary | ICD-10-CM | POA: Diagnosis not present

## 2020-05-23 DIAGNOSIS — D509 Iron deficiency anemia, unspecified: Secondary | ICD-10-CM | POA: Diagnosis not present

## 2020-05-25 DIAGNOSIS — Z992 Dependence on renal dialysis: Secondary | ICD-10-CM | POA: Diagnosis not present

## 2020-05-25 DIAGNOSIS — N2581 Secondary hyperparathyroidism of renal origin: Secondary | ICD-10-CM | POA: Diagnosis not present

## 2020-05-25 DIAGNOSIS — J158 Pneumonia due to other specified bacteria: Secondary | ICD-10-CM | POA: Diagnosis not present

## 2020-05-25 DIAGNOSIS — N186 End stage renal disease: Secondary | ICD-10-CM | POA: Diagnosis not present

## 2020-05-25 DIAGNOSIS — D509 Iron deficiency anemia, unspecified: Secondary | ICD-10-CM | POA: Diagnosis not present

## 2020-05-25 DIAGNOSIS — D631 Anemia in chronic kidney disease: Secondary | ICD-10-CM | POA: Diagnosis not present

## 2020-05-28 DIAGNOSIS — Z992 Dependence on renal dialysis: Secondary | ICD-10-CM | POA: Diagnosis not present

## 2020-05-28 DIAGNOSIS — J158 Pneumonia due to other specified bacteria: Secondary | ICD-10-CM | POA: Diagnosis not present

## 2020-05-28 DIAGNOSIS — D631 Anemia in chronic kidney disease: Secondary | ICD-10-CM | POA: Diagnosis not present

## 2020-05-28 DIAGNOSIS — N186 End stage renal disease: Secondary | ICD-10-CM | POA: Diagnosis not present

## 2020-05-28 DIAGNOSIS — D509 Iron deficiency anemia, unspecified: Secondary | ICD-10-CM | POA: Diagnosis not present

## 2020-05-28 DIAGNOSIS — N2581 Secondary hyperparathyroidism of renal origin: Secondary | ICD-10-CM | POA: Diagnosis not present

## 2020-05-30 DIAGNOSIS — D509 Iron deficiency anemia, unspecified: Secondary | ICD-10-CM | POA: Diagnosis not present

## 2020-05-30 DIAGNOSIS — J158 Pneumonia due to other specified bacteria: Secondary | ICD-10-CM | POA: Diagnosis not present

## 2020-05-30 DIAGNOSIS — Z992 Dependence on renal dialysis: Secondary | ICD-10-CM | POA: Diagnosis not present

## 2020-05-30 DIAGNOSIS — N186 End stage renal disease: Secondary | ICD-10-CM | POA: Diagnosis not present

## 2020-05-30 DIAGNOSIS — D631 Anemia in chronic kidney disease: Secondary | ICD-10-CM | POA: Diagnosis not present

## 2020-05-30 DIAGNOSIS — N2581 Secondary hyperparathyroidism of renal origin: Secondary | ICD-10-CM | POA: Diagnosis not present

## 2020-06-01 DIAGNOSIS — D631 Anemia in chronic kidney disease: Secondary | ICD-10-CM | POA: Diagnosis not present

## 2020-06-01 DIAGNOSIS — N186 End stage renal disease: Secondary | ICD-10-CM | POA: Diagnosis not present

## 2020-06-01 DIAGNOSIS — D509 Iron deficiency anemia, unspecified: Secondary | ICD-10-CM | POA: Diagnosis not present

## 2020-06-01 DIAGNOSIS — Z992 Dependence on renal dialysis: Secondary | ICD-10-CM | POA: Diagnosis not present

## 2020-06-01 DIAGNOSIS — J158 Pneumonia due to other specified bacteria: Secondary | ICD-10-CM | POA: Diagnosis not present

## 2020-06-01 DIAGNOSIS — N2581 Secondary hyperparathyroidism of renal origin: Secondary | ICD-10-CM | POA: Diagnosis not present

## 2020-06-03 DIAGNOSIS — N186 End stage renal disease: Secondary | ICD-10-CM | POA: Diagnosis not present

## 2020-06-03 DIAGNOSIS — Z992 Dependence on renal dialysis: Secondary | ICD-10-CM | POA: Diagnosis not present

## 2020-06-03 DIAGNOSIS — I12 Hypertensive chronic kidney disease with 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: Secondary | ICD-10-CM | POA: Diagnosis not present

## 2020-06-04 DIAGNOSIS — J158 Pneumonia due to other specified bacteria: Secondary | ICD-10-CM | POA: Diagnosis not present

## 2020-06-04 DIAGNOSIS — N2581 Secondary hyperparathyroidism of renal origin: Secondary | ICD-10-CM | POA: Diagnosis not present

## 2020-06-04 DIAGNOSIS — D509 Iron deficiency anemia, unspecified: Secondary | ICD-10-CM | POA: Diagnosis not present

## 2020-06-04 DIAGNOSIS — D631 Anemia in chronic kidney disease: Secondary | ICD-10-CM | POA: Diagnosis not present

## 2020-06-04 DIAGNOSIS — N186 End stage renal disease: Secondary | ICD-10-CM | POA: Diagnosis not present

## 2020-06-04 DIAGNOSIS — Z992 Dependence on renal dialysis: Secondary | ICD-10-CM | POA: Diagnosis not present

## 2020-06-05 ENCOUNTER — Other Ambulatory Visit: Payer: Self-pay

## 2020-06-05 ENCOUNTER — Ambulatory Visit (INDEPENDENT_AMBULATORY_CARE_PROVIDER_SITE_OTHER): Payer: Medicare Other | Admitting: Family Medicine

## 2020-06-05 VITALS — BP 154/92 | HR 64 | Wt 181.4 lb

## 2020-06-05 DIAGNOSIS — M542 Cervicalgia: Secondary | ICD-10-CM | POA: Diagnosis not present

## 2020-06-05 DIAGNOSIS — I10 Essential (primary) hypertension: Secondary | ICD-10-CM | POA: Diagnosis not present

## 2020-06-05 MED ORDER — DICLOFENAC SODIUM 1 % EX GEL: 2.0000 g | Freq: Four times a day (QID) | CUTANEOUS | 0 refills | Status: DC

## 2020-06-05 NOTE — Assessment & Plan Note (Signed)
Subacute neck pain likely due to positioning.  Red flags such as radiation into the elbow and tingling of the fingers and loss of sensation are not present.  Patient has intact range of motion with some restriction left-sided rotation.  Myofascial release suggestive of MSK related neck pain. - Start Voltaren gel - Start heating pad - Continue Tylenol for pain as needed - Neck strain exercises given to patient and handout - Consider PT if no improvement. - Follow-up in 2 weeks with persistent symptoms

## 2020-06-05 NOTE — Progress Notes (Signed)
    SUBJECTIVE:   CHIEF COMPLAINT / HPI:   Mr. Betty is a 55 year old male who presents to access to care for the issues below.  Neck pain Left-sided neck pain began 2 weeks ago.  It is now worsening and radiating into the shoulder.  He denies any trauma to the area or heavy lifting or radiation into the elbow or fingers.  Also denies any numbness or tingling shortness of breath or chest pain.  He has used Tylenol with some relief as well as sleeping on the left side.  Patient believes that he has slept wrong and neck muscle spasming.   Hypertension: - Medications: Clonidine, hydralazine, isosorbide dinitrate, metoprolol, amlodipine - Compliance: Yes last took daily medications this morning - Denies any SOB, CP, vision changes, LE edema, medication SEs, or symptoms of hypotension  PERTINENT  PMH / PSH: ESRD (should avoid nephrotoxic drugs)  OBJECTIVE:   BP (!) 154/92   Pulse 64   Wt 181 lb 6.4 oz (82.3 kg)   SpO2 97%   BMI 30.19 kg/m   General: Appears well, no acute distress. Age appropriate. Respiratory:  normal effort MSK: No erythema or skin changes of left posterior neck.  Palpation of right posterior cervical hypertonic musculature with ropey nature.  Restricted left rotation.  Otherwise range of motion intact.  Myofascial release appreciated with counterstrain positioning of left side bending.  ASSESSMENT/PLAN:   Neck pain Subacute neck pain likely due to positioning.  Red flags such as radiation into the elbow and tingling of the fingers and loss of sensation are not present.  Patient has intact range of motion with some restriction left-sided rotation.  Myofascial release suggestive of MSK related neck pain. - Start Voltaren gel - Start heating pad - Continue Tylenol for pain as needed - Neck strain exercises given to patient and handout - Consider PT if no improvement. - Follow-up in 2 weeks with persistent symptoms  Essential hypertension - Continue  medications - Follow-up with PCP in 2 weeks   Gerlene Fee, [place]

## 2020-06-05 NOTE — Assessment & Plan Note (Signed)
-   Continue medications - Follow-up with PCP in 2 weeks

## 2020-06-05 NOTE — Patient Instructions (Addendum)
Use heat, tylenol, voltaren gel Follow up in 2 weeks for BP and continued neck pain.  Cervical Strain and Sprain Rehab Ask your health care provider which exercises are safe for you. Do exercises exactly as told by your health care provider and adjust them as directed. It is normal to feel mild stretching, pulling, tightness, or discomfort as you do these exercises. Stop right away if you feel sudden pain or your pain gets worse. Do not begin these exercises until told by your health care provider. Stretching and range-of-motion exercises Cervical side bending 1. Using good posture, sit on a stable chair or stand up. 2. Without moving your shoulders, slowly tilt your left / right ear to your shoulder until you feel a stretch in the opposite side neck muscles. You should be looking straight ahead. 3. Hold for __________ seconds. 4. Repeat with the other side of your neck. Repeat __________ times. Complete this exercise __________ times a day.   Cervical rotation 1. Using good posture, sit on a stable chair or stand up. 2. Slowly turn your head to the side as if you are looking over your left / right shoulder. ? Keep your eyes level with the ground. ? Stop when you feel a stretch along the side and the back of your neck. 3. Hold for __________ seconds. 4. Repeat this by turning to your other side. Repeat __________ times. Complete this exercise __________ times a day.   Thoracic extension and pectoral stretch 1. Roll a towel or a small blanket so it is about 4 inches (10 cm) in diameter. 2. Lie down on your back on a firm surface. 3. Put the towel lengthwise, under your spine in the middle of your back. It should not be under your shoulder blades. The towel should line up with your spine from your middle back to your lower back. 4. Put your hands behind your head and let your elbows fall out to your sides. 5. Hold for __________ seconds. Repeat __________ times. Complete this exercise  __________ times a day. Strengthening exercises Isometric upper cervical flexion 1. Lie on your back with a thin pillow behind your head and a small rolled-up towel under your neck. 2. Gently tuck your chin toward your chest and nod your head down to look toward your feet. Do not lift your head off the pillow. 3. Hold for __________ seconds. 4. Release the tension slowly. Relax your neck muscles completely before you repeat this exercise. Repeat __________ times. Complete this exercise __________ times a day. Isometric cervical extension 1. Stand about 6 inches (15 cm) away from a wall, with your back facing the wall. 2. Place a soft object, about 6-8 inches (15-20 cm) in diameter, between the back of your head and the wall. A soft object could be a small pillow, a ball, or a folded towel. 3. Gently tilt your head back and press into the soft object. Keep your jaw and forehead relaxed. 4. Hold for __________ seconds. 5. Release the tension slowly. Relax your neck muscles completely before you repeat this exercise. Repeat __________ times. Complete this exercise __________ times a day.   Posture and body mechanics Body mechanics refers to the movements and positions of your body while you do your daily activities. Posture is part of body mechanics. Good posture and healthy body mechanics can help to relieve stress in your body's tissues and joints. Good posture means that your spine is in its natural S-curve position (your spine is neutral), your  shoulders are pulled back slightly, and your head is not tipped forward. The following are general guidelines for applying improved posture and body mechanics to your everyday activities. Sitting 1. When sitting, keep your spine neutral and keep your feet flat on the floor. Use a footrest, if necessary, and keep your thighs parallel to the floor. Avoid rounding your shoulders, and avoid tilting your head forward. 2. When working at a desk or a computer,  keep your desk at a height where your hands are slightly lower than your elbows. Slide your chair under your desk so you are close enough to maintain good posture. 3. When working at a computer, place your monitor at a height where you are looking straight ahead and you do not have to tilt your head forward or downward to look at the screen.   Standing  When standing, keep your spine neutral and keep your feet about hip-width apart. Keep a slight bend in your knees. Your ears, shoulders, and hips should line up.  When you do a task in which you stand in one place for a long time, place one foot up on a stable object that is 2-4 inches (5-10 cm) high, such as a footstool. This helps keep your spine neutral.   Resting When lying down and resting, avoid positions that are most painful for you. Try to support your neck in a neutral position. You can use a contour pillow or a small rolled-up towel. Your pillow should support your neck but not push on it. This information is not intended to replace advice given to you by your health care provider. Make sure you discuss any questions you have with your health care provider. Document Revised: 05/12/2018 Document Reviewed: 10/21/2017 Elsevier Patient Education  2021 [employer].

## 2020-06-06 DIAGNOSIS — D509 Iron deficiency anemia, unspecified: Secondary | ICD-10-CM | POA: Diagnosis not present

## 2020-06-06 DIAGNOSIS — N186 End stage renal disease: Secondary | ICD-10-CM | POA: Diagnosis not present

## 2020-06-06 DIAGNOSIS — J158 Pneumonia due to other specified bacteria: Secondary | ICD-10-CM | POA: Diagnosis not present

## 2020-06-06 DIAGNOSIS — Z992 Dependence on renal dialysis: Secondary | ICD-10-CM | POA: Diagnosis not present

## 2020-06-06 DIAGNOSIS — N2581 Secondary hyperparathyroidism of renal origin: Secondary | ICD-10-CM | POA: Diagnosis not present

## 2020-06-06 DIAGNOSIS — D631 Anemia in chronic kidney disease: Secondary | ICD-10-CM | POA: Diagnosis not present

## 2020-06-08 DIAGNOSIS — N2581 Secondary hyperparathyroidism of renal origin: Secondary | ICD-10-CM | POA: Diagnosis not present

## 2020-06-08 DIAGNOSIS — D509 Iron deficiency anemia, unspecified: Secondary | ICD-10-CM | POA: Diagnosis not present

## 2020-06-08 DIAGNOSIS — Z992 Dependence on renal dialysis: Secondary | ICD-10-CM | POA: Diagnosis not present

## 2020-06-08 DIAGNOSIS — D631 Anemia in chronic kidney disease: Secondary | ICD-10-CM | POA: Diagnosis not present

## 2020-06-08 DIAGNOSIS — N186 End stage renal disease: Secondary | ICD-10-CM | POA: Diagnosis not present

## 2020-06-08 DIAGNOSIS — J158 Pneumonia due to other specified bacteria: Secondary | ICD-10-CM | POA: Diagnosis not present

## 2020-06-11 DIAGNOSIS — N186 End stage renal disease: Secondary | ICD-10-CM | POA: Diagnosis not present

## 2020-06-11 DIAGNOSIS — N2581 Secondary hyperparathyroidism of renal origin: Secondary | ICD-10-CM | POA: Diagnosis not present

## 2020-06-11 DIAGNOSIS — J158 Pneumonia due to other specified bacteria: Secondary | ICD-10-CM | POA: Diagnosis not present

## 2020-06-11 DIAGNOSIS — Z992 Dependence on renal dialysis: Secondary | ICD-10-CM | POA: Diagnosis not present

## 2020-06-11 DIAGNOSIS — D631 Anemia in chronic kidney disease: Secondary | ICD-10-CM | POA: Diagnosis not present

## 2020-06-11 DIAGNOSIS — D509 Iron deficiency anemia, unspecified: Secondary | ICD-10-CM | POA: Diagnosis not present

## 2020-06-13 DIAGNOSIS — Z992 Dependence on renal dialysis: Secondary | ICD-10-CM | POA: Diagnosis not present

## 2020-06-13 DIAGNOSIS — D631 Anemia in chronic kidney disease: Secondary | ICD-10-CM | POA: Diagnosis not present

## 2020-06-13 DIAGNOSIS — N186 End stage renal disease: Secondary | ICD-10-CM | POA: Diagnosis not present

## 2020-06-13 DIAGNOSIS — N2581 Secondary hyperparathyroidism of renal origin: Secondary | ICD-10-CM | POA: Diagnosis not present

## 2020-06-13 DIAGNOSIS — D509 Iron deficiency anemia, unspecified: Secondary | ICD-10-CM | POA: Diagnosis not present

## 2020-06-13 DIAGNOSIS — J158 Pneumonia due to other specified bacteria: Secondary | ICD-10-CM | POA: Diagnosis not present

## 2020-06-15 DIAGNOSIS — Z992 Dependence on renal dialysis: Secondary | ICD-10-CM | POA: Diagnosis not present

## 2020-06-15 DIAGNOSIS — D631 Anemia in chronic kidney disease: Secondary | ICD-10-CM | POA: Diagnosis not present

## 2020-06-15 DIAGNOSIS — M542 Cervicalgia: Secondary | ICD-10-CM | POA: Diagnosis not present

## 2020-06-15 DIAGNOSIS — N2581 Secondary hyperparathyroidism of renal origin: Secondary | ICD-10-CM | POA: Diagnosis not present

## 2020-06-15 DIAGNOSIS — I10 Essential (primary) hypertension: Secondary | ICD-10-CM | POA: Diagnosis not present

## 2020-06-15 DIAGNOSIS — R0789 Other chest pain: Secondary | ICD-10-CM | POA: Diagnosis not present

## 2020-06-15 DIAGNOSIS — R079 Chest pain, unspecified: Secondary | ICD-10-CM | POA: Diagnosis not present

## 2020-06-15 DIAGNOSIS — J158 Pneumonia due to other specified bacteria: Secondary | ICD-10-CM | POA: Diagnosis not present

## 2020-06-15 DIAGNOSIS — D509 Iron deficiency anemia, unspecified: Secondary | ICD-10-CM | POA: Diagnosis not present

## 2020-06-15 DIAGNOSIS — N186 End stage renal disease: Secondary | ICD-10-CM | POA: Diagnosis not present

## 2020-06-18 DIAGNOSIS — N186 End stage renal disease: Secondary | ICD-10-CM | POA: Diagnosis not present

## 2020-06-18 DIAGNOSIS — N2581 Secondary hyperparathyroidism of renal origin: Secondary | ICD-10-CM | POA: Diagnosis not present

## 2020-06-18 DIAGNOSIS — Z992 Dependence on renal dialysis: Secondary | ICD-10-CM | POA: Diagnosis not present

## 2020-06-18 DIAGNOSIS — D631 Anemia in chronic kidney disease: Secondary | ICD-10-CM | POA: Diagnosis not present

## 2020-06-18 DIAGNOSIS — D509 Iron deficiency anemia, unspecified: Secondary | ICD-10-CM | POA: Diagnosis not present

## 2020-06-18 DIAGNOSIS — J158 Pneumonia due to other specified bacteria: Secondary | ICD-10-CM | POA: Diagnosis not present

## 2020-06-19 ENCOUNTER — Ambulatory Visit: Payer: Medicare Other | Admitting: Family Medicine

## 2020-06-20 DIAGNOSIS — Z992 Dependence on renal dialysis: Secondary | ICD-10-CM | POA: Diagnosis not present

## 2020-06-20 DIAGNOSIS — D631 Anemia in chronic kidney disease: Secondary | ICD-10-CM | POA: Diagnosis not present

## 2020-06-20 DIAGNOSIS — J158 Pneumonia due to other specified bacteria: Secondary | ICD-10-CM | POA: Diagnosis not present

## 2020-06-20 DIAGNOSIS — D509 Iron deficiency anemia, unspecified: Secondary | ICD-10-CM | POA: Diagnosis not present

## 2020-06-20 DIAGNOSIS — N186 End stage renal disease: Secondary | ICD-10-CM | POA: Diagnosis not present

## 2020-06-20 DIAGNOSIS — N2581 Secondary hyperparathyroidism of renal origin: Secondary | ICD-10-CM | POA: Diagnosis not present

## 2020-06-22 DIAGNOSIS — Z992 Dependence on renal dialysis: Secondary | ICD-10-CM | POA: Diagnosis not present

## 2020-06-22 DIAGNOSIS — N186 End stage renal disease: Secondary | ICD-10-CM | POA: Diagnosis not present

## 2020-06-22 DIAGNOSIS — J158 Pneumonia due to other specified bacteria: Secondary | ICD-10-CM | POA: Diagnosis not present

## 2020-06-22 DIAGNOSIS — N2581 Secondary hyperparathyroidism of renal origin: Secondary | ICD-10-CM | POA: Diagnosis not present

## 2020-06-22 DIAGNOSIS — D631 Anemia in chronic kidney disease: Secondary | ICD-10-CM | POA: Diagnosis not present

## 2020-06-22 DIAGNOSIS — D509 Iron deficiency anemia, unspecified: Secondary | ICD-10-CM | POA: Diagnosis not present

## 2020-06-25 DIAGNOSIS — N2581 Secondary hyperparathyroidism of renal origin: Secondary | ICD-10-CM | POA: Diagnosis not present

## 2020-06-25 DIAGNOSIS — D509 Iron deficiency anemia, unspecified: Secondary | ICD-10-CM | POA: Diagnosis not present

## 2020-06-25 DIAGNOSIS — N186 End stage renal disease: Secondary | ICD-10-CM | POA: Diagnosis not present

## 2020-06-25 DIAGNOSIS — Z992 Dependence on renal dialysis: Secondary | ICD-10-CM | POA: Diagnosis not present

## 2020-06-25 DIAGNOSIS — D631 Anemia in chronic kidney disease: Secondary | ICD-10-CM | POA: Diagnosis not present

## 2020-06-25 DIAGNOSIS — J158 Pneumonia due to other specified bacteria: Secondary | ICD-10-CM | POA: Diagnosis not present

## 2020-06-26 ENCOUNTER — Ambulatory Visit: Payer: Medicare Other

## 2020-06-27 DIAGNOSIS — J158 Pneumonia due to other specified bacteria: Secondary | ICD-10-CM | POA: Diagnosis not present

## 2020-06-27 DIAGNOSIS — D509 Iron deficiency anemia, unspecified: Secondary | ICD-10-CM | POA: Diagnosis not present

## 2020-06-27 DIAGNOSIS — N2581 Secondary hyperparathyroidism of renal origin: Secondary | ICD-10-CM | POA: Diagnosis not present

## 2020-06-27 DIAGNOSIS — N186 End stage renal disease: Secondary | ICD-10-CM | POA: Diagnosis not present

## 2020-06-27 DIAGNOSIS — Z992 Dependence on renal dialysis: Secondary | ICD-10-CM | POA: Diagnosis not present

## 2020-06-27 DIAGNOSIS — D631 Anemia in chronic kidney disease: Secondary | ICD-10-CM | POA: Diagnosis not present

## 2020-06-29 DIAGNOSIS — N2581 Secondary hyperparathyroidism of renal origin: Secondary | ICD-10-CM | POA: Diagnosis not present

## 2020-06-29 DIAGNOSIS — J158 Pneumonia due to other specified bacteria: Secondary | ICD-10-CM | POA: Diagnosis not present

## 2020-06-29 DIAGNOSIS — Z992 Dependence on renal dialysis: Secondary | ICD-10-CM | POA: Diagnosis not present

## 2020-06-29 DIAGNOSIS — D631 Anemia in chronic kidney disease: Secondary | ICD-10-CM | POA: Diagnosis not present

## 2020-06-29 DIAGNOSIS — D509 Iron deficiency anemia, unspecified: Secondary | ICD-10-CM | POA: Diagnosis not present

## 2020-06-29 DIAGNOSIS — N186 End stage renal disease: Secondary | ICD-10-CM | POA: Diagnosis not present

## 2020-07-02 DIAGNOSIS — N186 End stage renal disease: Secondary | ICD-10-CM | POA: Diagnosis not present

## 2020-07-02 DIAGNOSIS — N2581 Secondary hyperparathyroidism of renal origin: Secondary | ICD-10-CM | POA: Diagnosis not present

## 2020-07-02 DIAGNOSIS — Z992 Dependence on renal dialysis: Secondary | ICD-10-CM | POA: Diagnosis not present

## 2020-07-02 DIAGNOSIS — D631 Anemia in chronic kidney disease: Secondary | ICD-10-CM | POA: Diagnosis not present

## 2020-07-02 DIAGNOSIS — D509 Iron deficiency anemia, unspecified: Secondary | ICD-10-CM | POA: Diagnosis not present

## 2020-07-02 DIAGNOSIS — J158 Pneumonia due to other specified bacteria: Secondary | ICD-10-CM | POA: Diagnosis not present

## 2020-07-04 DIAGNOSIS — D631 Anemia in chronic kidney disease: Secondary | ICD-10-CM | POA: Diagnosis not present

## 2020-07-04 DIAGNOSIS — I12 Hypertensive chronic kidney disease with 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: Secondary | ICD-10-CM | POA: Diagnosis not present

## 2020-07-04 DIAGNOSIS — D509 Iron deficiency anemia, unspecified: Secondary | ICD-10-CM | POA: Diagnosis not present

## 2020-07-04 DIAGNOSIS — N2581 Secondary hyperparathyroidism of renal origin: Secondary | ICD-10-CM | POA: Diagnosis not present

## 2020-07-04 DIAGNOSIS — J158 Pneumonia due to other specified bacteria: Secondary | ICD-10-CM | POA: Diagnosis not present

## 2020-07-04 DIAGNOSIS — N186 End stage renal disease: Secondary | ICD-10-CM | POA: Diagnosis not present

## 2020-07-04 DIAGNOSIS — Z992 Dependence on renal dialysis: Secondary | ICD-10-CM | POA: Diagnosis not present

## 2020-07-06 DIAGNOSIS — J158 Pneumonia due to other specified bacteria: Secondary | ICD-10-CM | POA: Diagnosis not present

## 2020-07-06 DIAGNOSIS — N186 End stage renal disease: Secondary | ICD-10-CM | POA: Diagnosis not present

## 2020-07-06 DIAGNOSIS — D631 Anemia in chronic kidney disease: Secondary | ICD-10-CM | POA: Diagnosis not present

## 2020-07-06 DIAGNOSIS — D509 Iron deficiency anemia, unspecified: Secondary | ICD-10-CM | POA: Diagnosis not present

## 2020-07-06 DIAGNOSIS — N2581 Secondary hyperparathyroidism of renal origin: Secondary | ICD-10-CM | POA: Diagnosis not present

## 2020-07-06 DIAGNOSIS — Z992 Dependence on renal dialysis: Secondary | ICD-10-CM | POA: Diagnosis not present

## 2020-07-09 DIAGNOSIS — D631 Anemia in chronic kidney disease: Secondary | ICD-10-CM | POA: Diagnosis not present

## 2020-07-09 DIAGNOSIS — D509 Iron deficiency anemia, unspecified: Secondary | ICD-10-CM | POA: Diagnosis not present

## 2020-07-09 DIAGNOSIS — J158 Pneumonia due to other specified bacteria: Secondary | ICD-10-CM | POA: Diagnosis not present

## 2020-07-09 DIAGNOSIS — Z992 Dependence on renal dialysis: Secondary | ICD-10-CM | POA: Diagnosis not present

## 2020-07-09 DIAGNOSIS — N186 End stage renal disease: Secondary | ICD-10-CM | POA: Diagnosis not present

## 2020-07-09 DIAGNOSIS — N2581 Secondary hyperparathyroidism of renal origin: Secondary | ICD-10-CM | POA: Diagnosis not present

## 2020-07-11 DIAGNOSIS — N2581 Secondary hyperparathyroidism of renal origin: Secondary | ICD-10-CM | POA: Diagnosis not present

## 2020-07-11 DIAGNOSIS — N186 End stage renal disease: Secondary | ICD-10-CM | POA: Diagnosis not present

## 2020-07-11 DIAGNOSIS — D509 Iron deficiency anemia, unspecified: Secondary | ICD-10-CM | POA: Diagnosis not present

## 2020-07-11 DIAGNOSIS — Z992 Dependence on renal dialysis: Secondary | ICD-10-CM | POA: Diagnosis not present

## 2020-07-11 DIAGNOSIS — J158 Pneumonia due to other specified bacteria: Secondary | ICD-10-CM | POA: Diagnosis not present

## 2020-07-11 DIAGNOSIS — D631 Anemia in chronic kidney disease: Secondary | ICD-10-CM | POA: Diagnosis not present

## 2020-07-13 DIAGNOSIS — Z992 Dependence on renal dialysis: Secondary | ICD-10-CM | POA: Diagnosis not present

## 2020-07-13 DIAGNOSIS — N2581 Secondary hyperparathyroidism of renal origin: Secondary | ICD-10-CM | POA: Diagnosis not present

## 2020-07-13 DIAGNOSIS — D631 Anemia in chronic kidney disease: Secondary | ICD-10-CM | POA: Diagnosis not present

## 2020-07-13 DIAGNOSIS — J158 Pneumonia due to other specified bacteria: Secondary | ICD-10-CM | POA: Diagnosis not present

## 2020-07-13 DIAGNOSIS — D509 Iron deficiency anemia, unspecified: Secondary | ICD-10-CM | POA: Diagnosis not present

## 2020-07-13 DIAGNOSIS — N186 End stage renal disease: Secondary | ICD-10-CM | POA: Diagnosis not present

## 2020-07-16 DIAGNOSIS — D631 Anemia in chronic kidney disease: Secondary | ICD-10-CM | POA: Diagnosis not present

## 2020-07-16 DIAGNOSIS — J158 Pneumonia due to other specified bacteria: Secondary | ICD-10-CM | POA: Diagnosis not present

## 2020-07-16 DIAGNOSIS — N186 End stage renal disease: Secondary | ICD-10-CM | POA: Diagnosis not present

## 2020-07-16 DIAGNOSIS — D509 Iron deficiency anemia, unspecified: Secondary | ICD-10-CM | POA: Diagnosis not present

## 2020-07-16 DIAGNOSIS — Z992 Dependence on renal dialysis: Secondary | ICD-10-CM | POA: Diagnosis not present

## 2020-07-16 DIAGNOSIS — N2581 Secondary hyperparathyroidism of renal origin: Secondary | ICD-10-CM | POA: Diagnosis not present

## 2020-07-18 DIAGNOSIS — N2581 Secondary hyperparathyroidism of renal origin: Secondary | ICD-10-CM | POA: Diagnosis not present

## 2020-07-18 DIAGNOSIS — J158 Pneumonia due to other specified bacteria: Secondary | ICD-10-CM | POA: Diagnosis not present

## 2020-07-18 DIAGNOSIS — D631 Anemia in chronic kidney disease: Secondary | ICD-10-CM | POA: Diagnosis not present

## 2020-07-18 DIAGNOSIS — D509 Iron deficiency anemia, unspecified: Secondary | ICD-10-CM | POA: Diagnosis not present

## 2020-07-18 DIAGNOSIS — Z992 Dependence on renal dialysis: Secondary | ICD-10-CM | POA: Diagnosis not present

## 2020-07-18 DIAGNOSIS — N186 End stage renal disease: Secondary | ICD-10-CM | POA: Diagnosis not present

## 2020-07-20 DIAGNOSIS — D509 Iron deficiency anemia, unspecified: Secondary | ICD-10-CM | POA: Diagnosis not present

## 2020-07-20 DIAGNOSIS — N186 End stage renal disease: Secondary | ICD-10-CM | POA: Diagnosis not present

## 2020-07-20 DIAGNOSIS — Z992 Dependence on renal dialysis: Secondary | ICD-10-CM | POA: Diagnosis not present

## 2020-07-20 DIAGNOSIS — N2581 Secondary hyperparathyroidism of renal origin: Secondary | ICD-10-CM | POA: Diagnosis not present

## 2020-07-20 DIAGNOSIS — J158 Pneumonia due to other specified bacteria: Secondary | ICD-10-CM | POA: Diagnosis not present

## 2020-07-20 DIAGNOSIS — D631 Anemia in chronic kidney disease: Secondary | ICD-10-CM | POA: Diagnosis not present

## 2020-07-23 DIAGNOSIS — Z992 Dependence on renal dialysis: Secondary | ICD-10-CM | POA: Diagnosis not present

## 2020-07-23 DIAGNOSIS — J158 Pneumonia due to other specified bacteria: Secondary | ICD-10-CM | POA: Diagnosis not present

## 2020-07-23 DIAGNOSIS — D631 Anemia in chronic kidney disease: Secondary | ICD-10-CM | POA: Diagnosis not present

## 2020-07-23 DIAGNOSIS — D509 Iron deficiency anemia, unspecified: Secondary | ICD-10-CM | POA: Diagnosis not present

## 2020-07-23 DIAGNOSIS — N2581 Secondary hyperparathyroidism of renal origin: Secondary | ICD-10-CM | POA: Diagnosis not present

## 2020-07-23 DIAGNOSIS — N186 End stage renal disease: Secondary | ICD-10-CM | POA: Diagnosis not present

## 2020-07-25 DIAGNOSIS — Z992 Dependence on renal dialysis: Secondary | ICD-10-CM | POA: Diagnosis not present

## 2020-07-25 DIAGNOSIS — N2581 Secondary hyperparathyroidism of renal origin: Secondary | ICD-10-CM | POA: Diagnosis not present

## 2020-07-25 DIAGNOSIS — N186 End stage renal disease: Secondary | ICD-10-CM | POA: Diagnosis not present

## 2020-07-25 DIAGNOSIS — J158 Pneumonia due to other specified bacteria: Secondary | ICD-10-CM | POA: Diagnosis not present

## 2020-07-25 DIAGNOSIS — D509 Iron deficiency anemia, unspecified: Secondary | ICD-10-CM | POA: Diagnosis not present

## 2020-07-25 DIAGNOSIS — D631 Anemia in chronic kidney disease: Secondary | ICD-10-CM | POA: Diagnosis not present

## 2020-07-27 DIAGNOSIS — D509 Iron deficiency anemia, unspecified: Secondary | ICD-10-CM | POA: Diagnosis not present

## 2020-07-27 DIAGNOSIS — J158 Pneumonia due to other specified bacteria: Secondary | ICD-10-CM | POA: Diagnosis not present

## 2020-07-27 DIAGNOSIS — N186 End stage renal disease: Secondary | ICD-10-CM | POA: Diagnosis not present

## 2020-07-27 DIAGNOSIS — Z992 Dependence on renal dialysis: Secondary | ICD-10-CM | POA: Diagnosis not present

## 2020-07-27 DIAGNOSIS — D631 Anemia in chronic kidney disease: Secondary | ICD-10-CM | POA: Diagnosis not present

## 2020-07-27 DIAGNOSIS — N2581 Secondary hyperparathyroidism of renal origin: Secondary | ICD-10-CM | POA: Diagnosis not present

## 2020-07-30 DIAGNOSIS — D509 Iron deficiency anemia, unspecified: Secondary | ICD-10-CM | POA: Diagnosis not present

## 2020-07-30 DIAGNOSIS — N186 End stage renal disease: Secondary | ICD-10-CM | POA: Diagnosis not present

## 2020-07-30 DIAGNOSIS — N2581 Secondary hyperparathyroidism of renal origin: Secondary | ICD-10-CM | POA: Diagnosis not present

## 2020-07-30 DIAGNOSIS — J158 Pneumonia due to other specified bacteria: Secondary | ICD-10-CM | POA: Diagnosis not present

## 2020-07-30 DIAGNOSIS — D631 Anemia in chronic kidney disease: Secondary | ICD-10-CM | POA: Diagnosis not present

## 2020-07-30 DIAGNOSIS — Z992 Dependence on renal dialysis: Secondary | ICD-10-CM | POA: Diagnosis not present

## 2020-08-01 ENCOUNTER — Ambulatory Visit (INDEPENDENT_AMBULATORY_CARE_PROVIDER_SITE_OTHER): Payer: Medicare Other

## 2020-08-01 VITALS — Ht 65.0 in | Wt 180.0 lb

## 2020-08-01 DIAGNOSIS — Z0000 Encounter for general adult medical examination without abnormal findings: Secondary | ICD-10-CM | POA: Diagnosis not present

## 2020-08-01 DIAGNOSIS — D631 Anemia in chronic kidney disease: Secondary | ICD-10-CM | POA: Diagnosis not present

## 2020-08-01 DIAGNOSIS — N186 End stage renal disease: Secondary | ICD-10-CM | POA: Diagnosis not present

## 2020-08-01 DIAGNOSIS — Z992 Dependence on renal dialysis: Secondary | ICD-10-CM | POA: Diagnosis not present

## 2020-08-01 DIAGNOSIS — D509 Iron deficiency anemia, unspecified: Secondary | ICD-10-CM | POA: Diagnosis not present

## 2020-08-01 DIAGNOSIS — J158 Pneumonia due to other specified bacteria: Secondary | ICD-10-CM | POA: Diagnosis not present

## 2020-08-01 DIAGNOSIS — N2581 Secondary hyperparathyroidism of renal origin: Secondary | ICD-10-CM | POA: Diagnosis not present

## 2020-08-01 NOTE — Progress Notes (Addendum)
Subjective:   James Wall is a 55 y.o. male who presents for Medicare Annual/Subsequent preventive examination.  The patient consented to a virtual visit. Patient consented to have virtual visit and was identified by name and date of birth. Method of visit: Telephone  Encounter participants: Patient: James Wall - located at Home Nurse/Provider: Dorna Bloom - located at [facility] Others (if applicable): NA  Review of Systems: Defer to PCP.  Cardiac Risk Factors include: advanced age (>35mn, >>67women);male gender;hypertension;smoking/ tobacco exposure;sedentary lifestyle  Objective:    Vitals: Ht 5' 5"  (1.651 m)   Wt 180 lb (81.6 kg)   BMI 29.95 kg/m   Body mass index is 29.95 kg/m.  Advanced Directives 08/01/2020 09/20/2019 12/22/2017 09/15/2017 09/11/2017 03/09/2017 03/08/2017  Does Patient Have a Medical Advance Directive? No No No Yes Yes - No  Type of Advance Directive - - - Living will Living will - -  Does patient want to make changes to medical advance directive? - - - - Yes (Inpatient - patient defers changing a medical advance directive at this time) - -  Copy of HKitein Chart? - - - - - - -  Would patient like information on creating a medical advance directive? Yes (MAU/Ambulatory/Procedural Areas - Information given) No - Patient declined No - Patient declined - - No - Patient declined No - Patient declined  Pre-existing out of facility DNR order (yellow form or pink MOST form) - - - - - - -   Tobacco Social History   Tobacco Use  Smoking Status Every Day   Packs/day: 0.50   Years: 16.00   Pack years: 8.00   Types: Cigarettes   Last attempt to quit: 07/05/2019   Years since quitting: 1.0  Smokeless Tobacco Never     Ready to quit: No Counseling given: Yes  Clinical Intake:  Pre-visit preparation completed: Yes  How often do you need to have someone help you when you read instructions, pamphlets, or other written materials  from your doctor or pharmacy?: 2 - Rarely What is the last grade level you completed in school?: High School  Interpreter Needed?: No  Past Medical History:  Diagnosis Date   Anemia    Anxiety    Arthritis    GOUT - pt not sure if this is true- not sure 11-30-18   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  ESRD was due to HTN.  Dx'd with HTN in hospital 1996 according to pt, they had to keep him so he could get Medicaid to afford the BP medications.  First saw a nephrologist and started HD in the same year 2009.  [facility]  Does not have DM. He had a left RC AVF that never functioned, a left upper arm AVF that worked for about 5 years and as of June   GERD (gastroesophageal reflux disease)    Heart murmur    as a child per pt   Hyperlipidemia    Hypertension    NICM (nonischemic cardiomyopathy) ([facility]    PSVT (paroxysmal supraventricular tachycardia) ([facility]    Pulmonary hypertension ([place]    PVC's (premature ventricular contractions)    Secondary hyperparathyroidism ([facility]    Sepsis (  [place]) 02/2013   from AVF , treated with Vancomycin.   Sleep apnea    no  longer using cpap   Past Surgical History:  Procedure Laterality Date   AV FISTULA PLACEMENT  2009   Left lower arm AVF   AV FISTULA PLACEMENT Right 02/22/2013   Procedure:  CREATION  OF BRACHIAL CEPHALIC FISTULA RIGHT ARM;  Surgeon: Elam Dutch, MD;  Location: [facility] OR;  Service: Vascular;  Laterality: Right;   AV FISTULA PLACEMENT Left 08/10/2014   Procedure: BASILIC VEIN TRANSPOSITION  ARTERIOVENOUS (AV) FISTULA CREATION LEFT UPPER ARM;  Surgeon: Mal Misty, MD;  Location: [facility] OR;  Service: Vascular;  Laterality: Left;   COLONOSCOPY     ESOPHAGOGASTRODUODENOSCOPY (EGD) WITH PROPOFOL N/A 04/12/2013    Procedure: ESOPHAGOGASTRODUODENOSCOPY (EGD) WITH PROPOFOL;  Surgeon: Arta Silence, MD;  Location: [facility];  Service: Endoscopy;  Laterality: N/A;   INSERTION OF DIALYSIS CATHETER N/A 12/23/2012   Procedure: INSERTION OF DIALYSIS CATHETER; ULTRASOUND GUIDED;  Surgeon: Angelia Mould, MD;  Location: [facility];  Service: Vascular;  Laterality: N/A;   INSERTION OF DIALYSIS CATHETER  10/22/2015   Right IJ non-tunneled HD catheter, placed again in 2019   IR FLUORO GUIDE CV LINE RIGHT  08/18/2019   IR FLUORO GUIDE CV LINE RIGHT  10/07/2019   IR GENERIC HISTORICAL  10/22/2015   IR US GUIDE VASC ACCESS RIGHT 10/22/2015 [facility]   IR GENERIC HISTORICAL  10/22/2015   IR FLUORO GUIDE CV LINE RIGHT 10/22/2015 [facility]   IR GENERIC HISTORICAL  10/23/2015   IR FLUORO GUIDE CV LINE RIGHT 10/23/2015 Marybelle Killings, MD [facility]   LEFT HEART CATHETERIZATION WITH CORONARY ANGIOGRAM N/A 07/13/2013   Procedure: LEFT HEART CATHETERIZATION WITH CORONARY ANGIOGRAM;  Surgeon: Jettie Booze, MD;  Location: [facility] CATH LAB;  Service: Cardiovascular;  Laterality: N/A;   LIGATION OF ARTERIOVENOUS  FISTULA Left 12/22/2012   Procedure: LIGATION OF ARTERIOVENOUS  FISTULA;EXCISION OF LARGE ANEURYSMS;;  Surgeon: Elam Dutch, MD;  Location: [facility] OR;  Service: Vascular;  Laterality: Left;   UPPER GASTROINTESTINAL ENDOSCOPY     Family History  Problem Relation Age of Onset   Hypertension Mother    Cerebrovascular Accident Father    Hypertension Father    Congestive Heart Failure Brother    Asthma Brother    Stomach cancer Neg Hx    Rectal cancer Neg Hx    Esophageal cancer Neg Hx    Colon cancer Neg Hx    Social History   Socioeconomic History   Marital status: Single    Spouse name: Not on file   Number of children: 0   Years of education: 12th   Highest education level: High school graduate  Occupational History   Occupation: n/a  Tobacco Use   Smoking status: Every Day    Packs/day: 0.50     Years: 16.00    Pack years: 8.00    Types: Cigarettes    Last attempt to quit: 07/05/2019    Years since quitting: 1.0   Smokeless tobacco: Never  Vaping Use   Vaping Use: Never used  Substance and Sexual Activity   Alcohol use: Yes    Alcohol/week: 0.0 standard drinks    Comment: rare   Drug use: Yes    Frequency: 2.0 times per week    Types: Marijuana    Comment: last used 11-28-18   Sexual activity: Not Currently  Other Topics Concern   Not on file  Social History Narrative  Not on file   Social Determinants of Health   Financial Resource Strain: Low Risk    Difficulty of Paying Living Expenses: Not very hard  Food Insecurity: No Food Insecurity   Worried About Running Out of Food in the Last Year: Never true   Ran Out of Food in the Last Year: Never true  Transportation Needs: No Transportation Needs   Lack of Transportation (Medical): No   Lack of Transportation (Non-Medical): No  Physical Activity: Inactive   Days of Exercise per Week: 0 days   Minutes of Exercise per Session: 0 min  Stress: No Stress Concern Present   Feeling of Stress : Only a little  Social Connections: Socially Isolated   Frequency of Communication with Friends and Family: Twice a week   Frequency of Social Gatherings with Friends and Family: Twice a week   Attends Religious Services: Never   Marine scientist or Organizations: No   Attends Archivist Meetings: Never   Marital Status: Never married   Outpatient Encounter Medications as of 08/01/2020  Medication Sig   acetaminophen (TYLENOL) 650 MG CR tablet Take 1 tablet (650 mg total) by mouth daily as needed for pain.   amLODipine (NORVASC) 10 MG tablet TAKE 1 TABLET(10 MG) BY MOUTH DAILY   cloNIDine (CATAPRES - DOSED IN MG/24 HR) 0.3 mg/24hr patch Place 0.3 mg onto the skin once a week.    diclofenac Sodium (VOLTAREN) 1 % GEL Apply 2 g topically 4 (four) times daily.   hydrALAZINE (APRESOLINE) 25 MG tablet Take 25 mg by  mouth 2 (two) times daily.   hydrOXYzine (ATARAX/VISTARIL) 25 MG tablet Take 1 tablet (25 mg total) by mouth every 8 (eight) hours as needed for itching. Further refills need to come from your PCP.   isosorbide dinitrate (ISORDIL) 20 MG tablet TAKE 1 TABLET(20 MG) BY MOUTH TWICE DAILY   ketoconazole (NIZORAL) 2 % cream APP EXT AA BID   metoprolol tartrate (LOPRESSOR) 100 MG tablet Take 1 tablet by mouth twice a day.  ON DIALYSIS DAYS, TAKE AFTER DIALYSIS.   pantoprazole (PROTONIX) 40 MG tablet Take 1 tablet (40 mg total) by mouth 2 (two) times daily before a meal.   sevelamer carbonate (RENVELA) 800 MG tablet Take 3,200-4,800 mg by mouth See admin instructions. Take 4-6 tablets (3200-4800 mg) by mouth one to two times daily with meals   tamsulosin (FLOMAX) 0.4 MG CAPS capsule Take 0.4 mg by mouth daily.   umeclidinium-vilanterol (ANORO ELLIPTA) 62.5-25 MCG/INH AEPB Inhale 1 puff into the lungs daily.   No facility-administered encounter medications on file as of 08/01/2020.   Activities of Daily Living In your present state of health, do you have any difficulty performing the following activities: 08/01/2020  Hearing? N  Vision? N  Difficulty concentrating or making decisions? N  Walking or climbing stairs? Y  Dressing or bathing? N  Doing errands, shopping? N  Preparing Food and eating ? N  Using the Toilet? N  In the past six months, have you accidently leaked urine? N  Do you have problems with loss of bowel control? N  Managing your Medications? N  Managing your Finances? N  Housekeeping or managing your Housekeeping? N  Some recent data might be hidden   Patient Care Team: [name], MD as PCP - General (Family Medicine) Dorothy Spark, MD (Inactive) as PCP - Cardiology (Cardiology) [place], Jeneen Rinks, MD (Nephrology) Elam Dutch, MD as Consulting Physician (Vascular Surgery) Edrick Oh, MD as  Consulting Physician (Nephrology) [place]   Assessment:   This is a routine wellness examination for Akia.  Exercise Activities and Dietary recommendations Current Exercise Habits: The patient does not participate in regular exercise at present, Exercise limited by: respiratory conditions(s)   Goals      Quit Smoking     Patient started back smoking 8-10 ciggs per day.         Fall Risk Fall Risk  08/01/2020 06/05/2020 02/15/2013  Falls in the past year? 0 0 No  Number falls in past yr: - 0 -  Injury with Fall? - 0 -  Risk for fall due to : No Fall Risks - -   Is the patient's home free of loose throw rugs in walkways, pet beds, electrical cords, etc?   yes      Grab bars in the bathroom? yes      Handrails on the stairs?   yes      Adequate lighting?   yes  Patient rating of health (0-10): 7  Depression Screen PHQ 2/9 Scores 08/01/2020 06/05/2020 09/20/2019 09/15/2017  PHQ - 2 Score 3 3 4 4   PHQ- 9 Score 12 12 8 16    Cognitive Function 6CIT Screen 08/01/2020  What Year? 0 points  What month? 0 points  What time? 0 points  Count back from 20 0 points  Months in reverse 0 points  Repeat phrase 0 points  Total Score 0   Immunization History  Administered Date(s) Administered   PFIZER(Purple Top)SARS-COV-2 Vaccination 09/14/2019   Screening Tests Health Maintenance  Topic Date Due   Pneumococcal Vaccine 50-41 Years old (1 - PCV) Never done   TETANUS/TDAP  Never done   Zoster Vaccines- Shingrix (1 of 2) Never done   COVID-19 Vaccine (2 - Pfizer series) 10/05/2019   INFLUENZA VACCINE  09/03/2020   COLONOSCOPY (Pts 45-84yr Insurance coverage will need to be confirmed)  11/29/2025   Hepatitis C Screening  Completed   HIV Screening  Completed   HPV VACCINES  Aged Out   Cancer Screenings: Lung: Low Dose CT Chest recommended if Age 62100-80years, 30 pack-year currently smoking OR have quit w/in 15years. Patient does not qualify. Colorectal: UTD  Additional Screenings: Hepatitis C Screening: Completed   HIV Screening: Completed   Plan:  Call for PCP apt. Bring covid card so we can add your booster.  Fill out advance directive. Declines PNA vaccine. You do qualify for [employer] transportation. Let u[place]now if you are ever interested.   I have personally reviewed and noted the following in the patient's chart:   Medical and social history Use of alcohol, tobacco or illicit drugs  Current medications and supplements Functional ability and status Nutritional status Physical activity Advanced directives List of other physicians Hospitalizations, surgeries, and ER visits in previous 12 months Vitals Screenings to include cognitive, depression, and falls Referrals and appointments  In addition, I have reviewed and discussed with patient certain preventive protocols, quality metrics, and best practice recommendations. A written personalized care plan for preventive services as well as general preventive health recommendations were provided to patient.  This visit was conducted virtually in the setting of the [place].    EDorna Bloom CMA  08/02/2020    I have reviewed this visit and agree with the documentation.  CDorris Singh MD  Family Medicine Teaching Service

## 2020-08-02 NOTE — Patient Instructions (Addendum)
You spoke to James Wall, [place] over the phone for your annual wellness visit.  We discussed goals:   Goals      Quit Smoking     Patient started back smoking 8-10 ciggs per day.         We also discussed recommended health maintenance. Please call our office and schedule a visit. As discussed, you are due for: Health Maintenance  Topic Date Due   Pneumococcal Vaccine 37-56 Years old (1 - PCV) Never done   TETANUS/TDAP  Never done   Zoster Vaccines- Shingrix (1 of 2) Never done   COVID-19 Vaccine (3 - Booster for Pfizer series) 03/22/2020   INFLUENZA VACCINE  09/03/2020   COLONOSCOPY (Pts 45-66yr Insurance coverage will need to be confirmed)  11/29/2025   Hepatitis C Screening  Completed   HIV Screening  Completed   HPV VACCINES  Aged Out   Call for PCP apt. Bring covid card so we can add your booster.  Fill out advance directive. Declines PNA vaccine. You do qualify for [employer] transportation. Let [place] if you are ever interested.  We also discussed smoking cessation.  1-800-QUIT-NOW  Preventive Care [id number] Old, Male Preventive care refers to lifestyle choices and visits with your health care provider that can promote health and wellness. This includes: A yearly physical exam. This is also called an annual wellness visit. Regular dental and eye exams. Immunizations. Screening for certain conditions. Healthy lifestyle choices, such as: Eating a healthy diet. Getting regular exercise. Not using drugs or products that contain nicotine and tobacco. Limiting alcohol use. What can I expect for my preventive care visit? Physical exam Your health care provider will check your: Height and weight. These may be used to calculate your BMI (body mass index). BMI is a measurement that tells if you are at a healthy weight. Heart rate and blood pressure. Body temperature. Skin for abnormal spots. Counseling Your health care provider may ask you questions about your: Past  medical problems. Family's medical history. Alcohol, tobacco, and drug use. Emotional well-being. Home life and relationship well-being. Sexual activity. Diet, exercise, and sleep habits. Work and work [employer] Access to firearms. What immunizations do I need?  Vaccines are usually given at various ages, according to a schedule. Your health care provider will recommend vaccines for you based on your age, medicalhistory, and lifestyle or other factors, such as travel or where you work. What tests do I need? Blood tests Lipid and cholesterol levels. These may be checked every 5 years, or more often if you are over 56 years old. Hepatitis C test. Hepatitis B test. Screening Lung cancer screening. You may have this screening every year starting at age 56if you have a 30-pack-year history of smoking and currently smoke or have quit within the past 15 years. Prostate cancer screening. Recommendations will vary depending on your family history and other risks. Genital exam to check for testicular cancer or hernias. Colorectal cancer screening. All adults should have this screening starting at age 56 and continuing until age 56 Your health care provider may recommend screening at age 7527if you are at increased risk. You will have tests every 1-10 years, depending on your results and the type of screening test. Diabetes screening. This is done by checking your blood sugar (glucose) after you have not eaten for a while (fasting). You may have this done every 1-3 years. STD (sexually transmitted disease) testing, if you are at risk. Follow these instructions  at home: Eating and drinking  Eat a diet that includes fresh fruits and vegetables, whole grains, lean protein, and low-fat dairy products. Take vitamin and mineral supplements as recommended by your health care provider. Do not drink alcohol if your health care provider tells you not to drink. If you drink alcohol: Limit how much  you have to 0-2 drinks a day. Be aware of how much alcohol is in your drink. In the [place] one drink equals one 12 oz bottle of beer (355 mL), one 5 oz glass of wine (148 mL), or one 1 oz glass of hard liquor (44 mL).  Lifestyle Take daily care of your teeth and gums. Brush your teeth every morning and night with fluoride toothpaste. Floss one time each day. Stay active. Exercise for at least 30 minutes 5 or more days each week. Do not use any products that contain nicotine or tobacco, such as cigarettes, e-cigarettes, and chewing tobacco. If you need help quitting, ask your health care provider. Do not use drugs. If you are sexually active, practice safe sex. Use a condom or other form of protection to prevent STIs (sexually transmitted infections). If told by your health care provider, take low-dose aspirin daily starting at age 32. Find healthy ways to cope with stress, such as: Meditation, yoga, or listening to music. Journaling. Talking to a trusted person. Spending time with friends and family. Safety Always wear your seat belt while driving or riding in a vehicle. Do not drive: If you have been drinking alcohol. Do not ride with someone who has been drinking. When you are tired or distracted. While texting. Wear a helmet and other protective equipment during sports activities. If you have firearms in your house, make sure you follow all gun safety procedures. What's next? Go to your health care provider once a year for an annual wellness visit. Ask your health care provider how often you should have your eyes and teeth checked. Stay up to date on all vaccines. This information is not intended to replace advice given to you by your health care provider. Make sure you discuss any questions you have with your healthcare provider. Document Revised: 10/19/2018 Document Reviewed: 01/14/2018 Elsevier Patient Education  2022 [employer].   Our clinic's number is [phone]. Please  call with questions or concerns about what we discussed today.

## 2020-08-03 DIAGNOSIS — I12 Hypertensive chronic kidney disease with 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: Secondary | ICD-10-CM | POA: Diagnosis not present

## 2020-08-03 DIAGNOSIS — N186 End stage renal disease: Secondary | ICD-10-CM | POA: Diagnosis not present

## 2020-08-03 DIAGNOSIS — N2581 Secondary hyperparathyroidism of renal origin: Secondary | ICD-10-CM | POA: Diagnosis not present

## 2020-08-03 DIAGNOSIS — D509 Iron deficiency anemia, unspecified: Secondary | ICD-10-CM | POA: Diagnosis not present

## 2020-08-03 DIAGNOSIS — D631 Anemia in chronic kidney disease: Secondary | ICD-10-CM | POA: Diagnosis not present

## 2020-08-03 DIAGNOSIS — Z992 Dependence on renal dialysis: Secondary | ICD-10-CM | POA: Diagnosis not present

## 2020-08-03 DIAGNOSIS — J158 Pneumonia due to other specified bacteria: Secondary | ICD-10-CM | POA: Diagnosis not present

## 2020-08-06 DIAGNOSIS — D631 Anemia in chronic kidney disease: Secondary | ICD-10-CM | POA: Diagnosis not present

## 2020-08-06 DIAGNOSIS — N186 End stage renal disease: Secondary | ICD-10-CM | POA: Diagnosis not present

## 2020-08-06 DIAGNOSIS — D509 Iron deficiency anemia, unspecified: Secondary | ICD-10-CM | POA: Diagnosis not present

## 2020-08-06 DIAGNOSIS — N2581 Secondary hyperparathyroidism of renal origin: Secondary | ICD-10-CM | POA: Diagnosis not present

## 2020-08-06 DIAGNOSIS — J158 Pneumonia due to other specified bacteria: Secondary | ICD-10-CM | POA: Diagnosis not present

## 2020-08-06 DIAGNOSIS — Z992 Dependence on renal dialysis: Secondary | ICD-10-CM | POA: Diagnosis not present

## 2020-08-08 ENCOUNTER — Observation Stay (HOSPITAL_COMMUNITY)
Admission: EM | Admit: 2020-08-08 | Discharge: 2020-08-10 | Disposition: A | Discharge status: Home / Self Care | Payer: Medicare Other | Attending: Emergency Medicine | Admitting: Emergency Medicine

## 2020-08-08 ENCOUNTER — Encounter (HOSPITAL_COMMUNITY): Payer: Self-pay | Admitting: Emergency Medicine

## 2020-08-08 ENCOUNTER — Emergency Department (HOSPITAL_COMMUNITY): Payer: Medicare Other

## 2020-08-08 ENCOUNTER — Other Ambulatory Visit: Payer: Self-pay

## 2020-08-08 DIAGNOSIS — Z8701 Personal history of pneumonia (recurrent): Secondary | ICD-10-CM

## 2020-08-08 DIAGNOSIS — Z87891 Personal history of nicotine dependence: Secondary | ICD-10-CM | POA: Insufficient documentation

## 2020-08-08 DIAGNOSIS — J158 Pneumonia due to other specified bacteria: Secondary | ICD-10-CM | POA: Diagnosis not present

## 2020-08-08 DIAGNOSIS — N2581 Secondary hyperparathyroidism of renal origin: Secondary | ICD-10-CM | POA: Diagnosis not present

## 2020-08-08 DIAGNOSIS — D509 Iron deficiency anemia, unspecified: Secondary | ICD-10-CM | POA: Diagnosis not present

## 2020-08-08 DIAGNOSIS — J449 Chronic obstructive pulmonary disease, unspecified: Secondary | ICD-10-CM | POA: Diagnosis not present

## 2020-08-08 DIAGNOSIS — D631 Anemia in chronic kidney disease: Secondary | ICD-10-CM | POA: Diagnosis not present

## 2020-08-08 DIAGNOSIS — R1011 Right upper quadrant pain: Secondary | ICD-10-CM | POA: Diagnosis not present

## 2020-08-08 DIAGNOSIS — R066 Hiccough: Secondary | ICD-10-CM | POA: Diagnosis not present

## 2020-08-08 DIAGNOSIS — A419 Sepsis, unspecified organism: Secondary | ICD-10-CM | POA: Insufficient documentation

## 2020-08-08 DIAGNOSIS — Z20822 Contact with and (suspected) exposure to covid-19: Secondary | ICD-10-CM | POA: Diagnosis not present

## 2020-08-08 DIAGNOSIS — Z79899 Other long term (current) drug therapy: Secondary | ICD-10-CM | POA: Insufficient documentation

## 2020-08-08 DIAGNOSIS — J189 Pneumonia, unspecified organism: Principal | ICD-10-CM | POA: Insufficient documentation

## 2020-08-08 DIAGNOSIS — Z992 Dependence on renal dialysis: Secondary | ICD-10-CM | POA: Diagnosis not present

## 2020-08-08 DIAGNOSIS — N186 End stage renal disease: Secondary | ICD-10-CM | POA: Diagnosis not present

## 2020-08-08 DIAGNOSIS — R0602 Shortness of breath: Secondary | ICD-10-CM | POA: Diagnosis not present

## 2020-08-08 DIAGNOSIS — I12 Hypertensive chronic kidney disease with 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: Secondary | ICD-10-CM | POA: Insufficient documentation

## 2020-08-08 DIAGNOSIS — R109 Unspecified abdominal pain: Secondary | ICD-10-CM | POA: Diagnosis not present

## 2020-08-08 DIAGNOSIS — J168 Pneumonia due to other specified infectious organisms: Secondary | ICD-10-CM | POA: Diagnosis not present

## 2020-08-08 DIAGNOSIS — R059 Cough, unspecified: Secondary | ICD-10-CM | POA: Diagnosis not present

## 2020-08-08 DIAGNOSIS — R509 Fever, unspecified: Secondary | ICD-10-CM | POA: Diagnosis not present

## 2020-08-08 HISTORY — DX: Personal history of pneumonia (recurrent): Z87.01

## 2020-08-08 LAB — CBC WITH DIFFERENTIAL/PLATELET
Abs Immature Granulocytes: 0.07 10*3/uL (ref 0.00–0.07)
Basophils Absolute: 0 10*3/uL (ref 0.0–0.1)
Basophils Relative: 0 %
Eosinophils Absolute: 0.1 10*3/uL (ref 0.0–0.5)
Eosinophils Relative: 1 %
HCT: 29.4 % — ABNORMAL LOW (ref 39.0–52.0)
Hemoglobin: 9.2 g/dL — ABNORMAL LOW (ref 13.0–17.0)
Immature Granulocytes: 1 %
Lymphocytes Relative: 6 %
Lymphs Abs: 0.5 10*3/uL — ABNORMAL LOW (ref 0.7–4.0)
MCH: 24.1 pg — ABNORMAL LOW (ref 26.0–34.0)
MCHC: 31.3 g/dL (ref 30.0–36.0)
MCV: 77 fL — ABNORMAL LOW (ref 80.0–100.0)
Monocytes Absolute: 0.9 10*3/uL (ref 0.1–1.0)
Monocytes Relative: 11 %
Neutro Abs: 7 10*3/uL (ref 1.7–7.7)
Neutrophils Relative %: 81 %
Platelets: 201 10*3/uL (ref 150–400)
RBC: 3.82 MIL/uL — ABNORMAL LOW (ref 4.22–5.81)
RDW: 20.1 % — ABNORMAL HIGH (ref 11.5–15.5)
WBC: 8.6 10*3/uL (ref 4.0–10.5)
nRBC: 0 % (ref 0.0–0.2)

## 2020-08-08 LAB — COMPREHENSIVE METABOLIC PANEL
ALT: 9 U/L (ref 0–44)
AST: 16 U/L (ref 15–41)
Albumin: 3 g/dL — ABNORMAL LOW (ref 3.5–5.0)
Alkaline Phosphatase: 45 U/L (ref 38–126)
Anion gap: 13 (ref 5–15)
BUN: 18 mg/dL (ref 6–20)
CO2: 30 mmol/L (ref 22–32)
Calcium: 9.4 mg/dL (ref 8.9–10.3)
Chloride: 90 mmol/L — ABNORMAL LOW (ref 98–111)
Creatinine, Ser: 8.04 mg/dL — ABNORMAL HIGH (ref 0.61–1.24)
GFR, Estimated: 7 mL/min — ABNORMAL LOW (ref >60)
Glucose, Bld: 124 mg/dL — ABNORMAL HIGH (ref 70–99)
Potassium: 3.1 mmol/L — ABNORMAL LOW (ref 3.5–5.1)
Sodium: 133 mmol/L — ABNORMAL LOW (ref 135–145)
Total Bilirubin: 1.3 mg/dL — ABNORMAL HIGH (ref 0.3–1.2)
Total Protein: 7.8 g/dL (ref 6.5–8.1)

## 2020-08-08 LAB — LIPASE, BLOOD: Lipase: 22 U/L (ref 11–51)

## 2020-08-08 LAB — RESP PANEL BY RT-PCR (FLU A&B, COVID) ARPGX2
Influenza A by PCR: NEGATIVE
Influenza B by PCR: NEGATIVE
SARS Coronavirus 2 by RT PCR: NEGATIVE

## 2020-08-08 MED ORDER — ACETAMINOPHEN 325 MG PO TABS
650.0000 mg | ORAL_TABLET | Freq: Once | ORAL | Status: DC
  Filled 2020-08-08: qty 2

## 2020-08-08 MED ORDER — HYDROMORPHONE HCL 1 MG/ML IJ SOLN
0.5000 mg | Freq: Once | INTRAMUSCULAR | Status: AC
  Administered 2020-08-09: 0.5 mg via INTRAVENOUS
  Filled 2020-08-08: qty 1

## 2020-08-08 MED ORDER — PANTOPRAZOLE SODIUM 40 MG IV SOLR
40.0000 mg | Freq: Once | INTRAVENOUS | Status: AC
  Administered 2020-08-09: 40 mg via INTRAVENOUS
  Filled 2020-08-08: qty 40

## 2020-08-08 MED ORDER — PIPERACILLIN-TAZOBACTAM IN DEX 2-0.25 GM/50ML IV SOLN
2.2500 g | Freq: Three times a day (TID) | INTRAVENOUS | Status: DC
  Filled 2020-08-08 (×2): qty 50

## 2020-08-08 MED ORDER — METOCLOPRAMIDE HCL 5 MG/ML IJ SOLN
5.0000 mg | Freq: Once | INTRAMUSCULAR | Status: AC
  Administered 2020-08-09: 5 mg via INTRAVENOUS
  Filled 2020-08-08: qty 2

## 2020-08-08 NOTE — ED Triage Notes (Signed)
Pt here with c/o hiccups sob aand nausea , off and on since sat , is on dialysis

## 2020-08-08 NOTE — ED Provider Notes (Signed)
Emergency Medicine Provider Triage Evaluation Note  James Wall , a 55 y.o. male  was evaluated in triage.  Pt complains of hiccups and shortness of breath.  Patient has had sick ribs consistently since Saturday.  He has not been getting worse over this time.  Patient endorses associated shortness of breath with hiccups.  Patient also reports that they have had episodes of nausea after drinking fluids over the last few days.  Patient also endorses productive cough, rhinorrhea, and nasal congestion.  Patient denies any abdominal pain, hematemesis, or emesis.  Patient is a dialysis patient receives dialysis Monday, Wednesday and Friday.  Patient did have dialysis earlier this morning.  Patient has been vaccinated for COVID-19 and has received booster.  Patient reports that they have not febrile until arrival.  Review of Systems  Positive: Symptoms, shortness of breath, nausea, vomiting, productive cough, rhinorrhea, nasal congestion Negative: Chest pain, chills, abdominal pain, hematemesis, coffee-ground emesis,  Physical Exam  BP (!) 173/98 (BP Location: Right Arm)   Pulse 100   Temp (!) 102.8 F (39.3 C)   Resp 18   SpO2 100%  Gen:   Awake, no distress   Resp:  Normal effort, lungs clear to auscultation bilaterally MSK:   Moves extremities without difficulty  Other:  Abdomen soft, nondistended, nontender.  Medical Decision Making  Medically screening exam initiated at 12:25 PM.  Appropriate orders placed.  [name] was informed that the remainder of the evaluation will be completed by another provider, this initial triage assessment does not replace that evaluation, and the importance of remaining in the ED until their evaluation is complete.  The patient appears stable so that the remainder of the work up may be completed by another provider.      Loni Beckwith, PA-C 08/08/20 1227    Charlesetta Shanks, MD 08/09/20 [phone]

## 2020-08-08 NOTE — ED Provider Notes (Addendum)
[facility] EMERGENCY DEPARTMENT Provider Note   CSN: [id number] Arrival date & time: 08/08/20  1119     History No chief complaint on file.   James Wall is a 54 y.o. male with a history of ESRD (M/W/F), anemia, COPD, psoriasis, nonischemic cardiomyopathy, pulmonary hypertension, hyperlipidemia, HTN, secondary hyperparathyroidism who presents to the emergency department with a chief complaint of hiccups.  The patient reports that he has been struggling with hiccups for the last 5 days.  He will have brief periods where the hiccuping seems resolved, but will recur shortly.  He states that he feels as if he is gasping for breath when he is hiccuping.  He reports previous episodes of hiccuping that resolved after brief episodes.  No treatment prior to arrival.  He reports associated chills, nausea, vomiting, diarrhea, productive cough, and nasal congestion.  Reports that he has been unable to eat since Saturday due to the vomiting.  He also endorses abdominal pain and distention.  He characterizes the pain as sore and achy in the upper abdomen.  No known aggravating or alleviating factors.  He was found to be febrile in the emergency department, but patient was unaware that he had a fever prior to arrival.  He denies constipation, chest pain, syncope, numbness, weakness, back pain, rash.  He does not make urine.  Reports that he completed 3 hours of his dialysis session today.  He was below his dry weight when he arrived for dialysis.  The history is provided by the patient and medical records. No language interpreter was used.      Past Medical History:  Diagnosis Date   Anemia    Anxiety    Arthritis    GOUT - pt not sure if this is true- not sure 11-30-18   AV fistula occlusion ([place]) 10/2015   Blood transfusion without reported diagnosis    Cirrhosis, nonalcoholic ([place])    COPD (chronic obstructive pulmonary disease) ([place])    Depression    ESRD on  hemodialysis ([place])    Started HD Jan 2009.  ESRD was due to HTN.  Dx'd with HTN in hospital 1996 according to pt, they had to keep him so he could get Medicaid to afford the BP medications.  First saw a nephrologist and started HD in the same year 2009.  Gets HD at [facility] on a MWF schedule.  Does not have DM. He had a left RC AVF that never functioned, a left upper arm AVF that worked for about 5 years and as of June   GERD (gastroesophageal reflux disease)    Heart murmur    as a child per pt   Hyperlipidemia    Hypertension    NICM (nonischemic cardiomyopathy) ([place])    PSVT (paroxysmal supraventricular tachycardia) ([place])    Pulmonary hypertension ([place])    PVC's (premature ventricular contractions)    Secondary hyperparathyroidism ([place])    Sepsis ([place]) 02/2013   from AVF , treated with Vancomycin.   Sleep apnea    no  longer using cpap    Patient Active Problem List   Diagnosis Date Noted   CAP (community acquired pneumonia) 08/09/2020   Neck pain 06/05/2020   Dyspnea on exertion 09/22/2019   Tubular adenoma of colon 12/13/2018   Chronic vomiting 07/11/2018   Acute pulmonary edema (HCC)    Situational insomnia 09/24/2017   Generalized anxiety disorder 09/17/2017   BPH (benign prostatic hyperplasia) 09/12/2017   Bacteremia associated with intravascular line ([place])  09/11/2017   Dry skin 05/05/2017   Cirrhosis ([place])    Hyperkalemia 03/09/2017   Hypertensive urgency 33/54/5625   Complication of AV dialysis fistula 10/22/2015   ESRD on dialysis ([place]) 10/22/2015   Elevated d-dimer 07/12/2013   Abnormal cardiovascular function study 07/12/2013   Abnormal stress test 06/22/2013   Shortness of breath 05/31/2013   GERD (gastroesophageal reflux disease) 12/21/2012   Anemia of renal disease 12/21/2012   Essential hypertension 04/06/2008   ASTHMA 04/06/2008   SLEEP APNEA 04/06/2008    Past Surgical History:  Procedure Laterality Date   AV FISTULA PLACEMENT  2009   Left lower arm  AVF   AV FISTULA PLACEMENT Right 02/22/2013   Procedure:  CREATION  OF BRACHIAL CEPHALIC FISTULA RIGHT ARM;  Surgeon: Elam Dutch, MD;  Location: [facility];  Service: Vascular;  Laterality: Right;   AV FISTULA PLACEMENT Left 08/10/2014   Procedure: BASILIC VEIN TRANSPOSITION  ARTERIOVENOUS (AV) FISTULA CREATION LEFT UPPER ARM;  Surgeon: Mal Misty, MD;  [facility];  Service: Vascular;  Laterality: Left;   COLONOSCOPY     ESOPHAGOGASTRODUODENOSCOPY (EGD) WITH PROPOFOL N/A 04/12/2013   Procedure: ESOPHAGOGASTRODUODENOSCOPY (EGD) WITH PROPOFOL;  Surgeon: Arta Silence, MD;  Location: [facility];  Service: Endoscopy;  Laterality: N/A;   INSERTION OF DIALYSIS CATHETER N/A 12/23/2012   Procedure: INSERTION OF DIALYSIS CATHETER; ULTRASOUND GUIDED;  Surgeon: Angelia Mould, MD;  Location: [facility];  Service: Vascular;  Laterality: N/A;   INSERTION OF DIALYSIS CATHETER  10/22/2015   Right IJ non-tunneled HD catheter, placed again in 2019   IR FLUORO GUIDE CV LINE RIGHT  08/18/2019   IR FLUORO GUIDE CV LINE RIGHT  10/07/2019   IR GENERIC HISTORICAL  10/22/2015   IR US GUIDE VASC ACCESS RIGHT 10/22/2015 [facility]   IR GENERIC HISTORICAL  10/22/2015   IR FLUORO GUIDE CV LINE RIGHT 10/22/2015 [facility]   IR GENERIC HISTORICAL  10/23/2015   IR FLUORO GUIDE CV LINE RIGHT 10/23/2015 Marybelle Killings, MD [facility]   LEFT HEART CATHETERIZATION WITH CORONARY ANGIOGRAM N/A 07/13/2013   Procedure: LEFT HEART CATHETERIZATION WITH CORONARY ANGIOGRAM;  Surgeon: Jettie Booze, MD;  Location: [facility] CATH LAB;  Service: Cardiovascular;  Laterality: N/A;   LIGATION OF ARTERIOVENOUS  FISTULA Left 12/22/2012   Procedure: LIGATION OF ARTERIOVENOUS  FISTULA;EXCISION OF LARGE ANEURYSMS;;  Surgeon: Elam Dutch, MD;  Location: [facility] OR;  Service: Vascular;  Laterality: Left;   UPPER GASTROINTESTINAL ENDOSCOPY         Family History  Problem Relation Age of Onset   Hypertension Mother    Cerebrovascular  Accident Father    Hypertension Father    Congestive Heart Failure Brother    Asthma Brother    Stomach cancer Neg Hx    Rectal cancer Neg Hx    Esophageal cancer Neg Hx    Colon cancer Neg Hx     Social History   Tobacco Use   Smoking status: Former    Packs/day: 0.50    Years: 16.00    Pack years: 8.00    Types: Cigarettes    Quit date: 07/05/2019    Years since quitting: 1.1   Smokeless tobacco: Never  Vaping Use   Vaping Use: Never used  Substance Use Topics   Alcohol use: Yes    Alcohol/week: 2.0 standard drinks    Types: 2 Shots of liquor per week   Drug use: Yes    Frequency: 2.0 times per week  Types: Marijuana    Comment: last used 11-28-18    Home Medications Prior to Admission medications   Medication Sig Start Date End Date Taking? Authorizing Provider  acetaminophen (TYLENOL) 650 MG CR tablet Take 1 tablet (650 mg total) by mouth daily as needed for pain. 03/17/17  Yes Mariel Aloe, MD  amLODipine (NORVASC) 10 MG tablet TAKE 1 TABLET(10 MG) BY MOUTH DAILY 04/26/18  Yes Allred, Jeneen Rinks, MD  cloNIDine (CATAPRES - DOSED IN MG/24 HR) 0.3 mg/24hr patch Place 0.3 mg onto the skin every Sunday. 12/29/18  Yes [provider]  Doxercalciferol ([place] IV) Dialysis Monday,Wednesday,friday 05/30/20 06/24/21 Yes [provider]  hydrALAZINE (APRESOLINE) 25 MG tablet Take 25 mg by mouth 2 (two) times daily.   Yes [provider]  hydrOXYzine (ATARAX/VISTARIL) 25 MG tablet Take 1 tablet (25 mg total) by mouth every 8 (eight) hours as needed for itching. Further refills need to come from your PCP. 04/23/17  Yes Dorothy Spark, MD  isosorbide dinitrate (ISORDIL) 20 MG tablet TAKE 1 TABLET(20 MG) BY MOUTH TWICE DAILY 08/31/19  Yes Dorothy Spark, MD  Methoxy PEG-Epoetin Beta (MIRCERA IJ) Dialysis Monday,Wednesday,friday 08/24/19 07/31/21 Yes [provider]  metoprolol tartrate (LOPRESSOR) 100 MG tablet Take 1 tablet by mouth twice a day.   ON DIALYSIS DAYS, TAKE AFTER DIALYSIS. 02/17/20  Yes Dorothy Spark, MD  pantoprazole (PROTONIX) 40 MG tablet Take 1 tablet (40 mg total) by mouth 2 (two) times daily before a meal. 03/10/19  Yes Thornton Park, MD  sevelamer carbonate (RENVELA) 800 MG tablet Take 1,600-3,200 mg by mouth See admin instructions. 2-4 tabs with meals depending on meal size   Yes [provider]  tamsulosin (FLOMAX) 0.4 MG CAPS capsule Take 0.4 mg by mouth daily. 08/01/14  Yes [provider]  umeclidinium-vilanterol (ANORO ELLIPTA) 62.5-25 MCG/INH AEPB Inhale 1 puff into the lungs daily. 09/20/19  Yes Autry-Lott, Naaman Plummer, DO  azithromycin (ZITHROMAX) 500 MG tablet Take 1 tablet (500 mg total) by mouth daily. 08/10/20   Allie Bossier, MD  cefdinir (OMNICEF) 300 MG capsule Take 1 capsule on Saturday, 1 capsule on Monday, 1 capsule on Wednesday 08/10/20   Allie Bossier, MD  chlorproMAZINE (THORAZINE) 10 MG tablet Take 1 tablet (10 mg total) by mouth 3 (three) times daily as needed for hiccups. 08/10/20   Allie Bossier, MD  Ipratropium-Albuterol (COMBIVENT) 20-100 MCG/ACT AERS respimat Inhale 1 puff into the lungs every 6 (six) hours as needed for wheezing. 08/10/20   Allie Bossier, MD  predniSONE (DELTASONE) 50 MG tablet take 1 tablet by mouth daily with breakfast 08/10/20   Allie Bossier, MD    Allergies    Venofer  [ferric oxide] and Aspirin  Review of Systems   Review of Systems  Constitutional:  Positive for chills and fever. Negative for appetite change and fatigue.  HENT:  Positive for congestion and rhinorrhea. Negative for sore throat.   Eyes:  Negative for visual disturbance.  Respiratory:  Positive for cough and shortness of breath.   Cardiovascular:  Negative for chest pain.  Gastrointestinal:  Positive for abdominal pain, diarrhea, nausea and vomiting. Negative for blood in stool and constipation.       Hiccups  Genitourinary:  Negative for testicular pain.  Musculoskeletal:   Negative for back pain, myalgias, neck pain and neck stiffness.  Skin:  Negative for rash and wound.  Allergic/Immunologic: Positive for immunocompromised state.  Neurological:  Negative for dizziness, seizures, syncope, weakness,  numbness and headaches.  Psychiatric/Behavioral:  Negative for confusion.    Physical Exam Updated Vital Signs BP (!) 184/101 (BP Location: Right Arm)   Pulse 79   Temp 98.8 F (37.1 C) (Oral)   Resp 16   Ht 5' 5"  (1.651 m)   Wt 78.3 kg   SpO2 96%   BMI 28.73 kg/m   Physical Exam Vitals and nursing note reviewed.  Constitutional:      Appearance: He is well-developed. He is not ill-appearing, toxic-appearing or diaphoretic.     Comments: Chronically ill-appearing  HENT:     Head: Normocephalic.  Eyes:     Conjunctiva/sclera: Conjunctivae normal.  Cardiovascular:     Rate and Rhythm: Normal rate and regular rhythm.     Heart sounds: No murmur heard. Pulmonary:     Effort: Pulmonary effort is normal. No respiratory distress.     Breath sounds: No stridor. No wheezing, rhonchi or rales.     Comments: Lungs are clear to auscultation bilaterally.  No increased work of breathing. Chest:     Chest wall: No tenderness.  Abdominal:     General: There is distension.     Palpations: Abdomen is soft. There is no mass.     Tenderness: There is abdominal tenderness. There is guarding. There is no right CVA tenderness, left CVA tenderness or rebound.     Hernia: No hernia is present.     Comments: Tender to palpation in the epigastric and right upper quadrant region.  He has a positive Murphy sign.  No CVA tenderness bilaterally.  There is guarding, but no rebound.  He also has tenderness to palpation in the left lower quadrant without guarding or rebound.  Abdomen is moderately distended, but soft.  No CVA tenderness bilaterally.  Musculoskeletal:        General: No tenderness.     Cervical back: Neck supple.     Right lower leg: No edema.     Left lower  leg: No edema.  Skin:    General: Skin is warm and dry.  Neurological:     Mental Status: He is alert.  Psychiatric:        Behavior: Behavior normal.    ED Results / Procedures / Treatments   Labs (all labs ordered are listed, but only abnormal results are displayed) Labs Reviewed  COMPREHENSIVE METABOLIC PANEL - Abnormal; Notable for the following components:      Result Value   Sodium 133 (*)    Potassium 3.1 (*)    Chloride 90 (*)    Glucose, Bld 124 (*)    Creatinine, Ser 8.04 (*)    Albumin 3.0 (*)    Total Bilirubin 1.3 (*)    GFR, Estimated 7 (*)    All other components within normal limits  CBC WITH DIFFERENTIAL/PLATELET - Abnormal; Notable for the following components:   RBC 3.82 (*)    Hemoglobin 9.2 (*)    HCT 29.4 (*)    MCV 77.0 (*)    MCH 24.1 (*)    RDW 20.1 (*)    Lymphs Abs 0.5 (*)    All other components within normal limits  RENAL FUNCTION PANEL - Abnormal; Notable for the following components:   Sodium 133 (*)    Chloride 91 (*)    Glucose, Bld 113 (*)    BUN 30 (*)    Creatinine, Ser 10.46 (*)    Albumin 2.6 (*)    GFR, Estimated 5 (*)  All other components within normal limits  CBC WITH DIFFERENTIAL/PLATELET - Abnormal; Notable for the following components:   RBC 3.73 (*)    Hemoglobin 8.8 (*)    HCT 29.2 (*)    MCV 78.3 (*)    MCH 23.6 (*)    RDW 20.6 (*)    Monocytes Absolute 1.1 (*)    Abs Immature Granulocytes 0.09 (*)    All other components within normal limits  CBC - Abnormal; Notable for the following components:   WBC 12.8 (*)    RBC 3.48 (*)    Hemoglobin 8.2 (*)    HCT 27.1 (*)    MCV 77.9 (*)    MCH 23.6 (*)    RDW 20.6 (*)    All other components within normal limits  RENAL FUNCTION PANEL - Abnormal; Notable for the following components:   Sodium 133 (*)    Chloride 92 (*)    Glucose, Bld 146 (*)    BUN 49 (*)    Creatinine, Ser 12.54 (*)    Calcium 10.9 (*)    Albumin 2.8 (*)    GFR, Estimated 4 (*)    All  other components within normal limits  DIFFERENTIAL - Abnormal; Notable for the following components:   Neutro Abs 11.1 (*)    Lymphs Abs 0.6 (*)    Abs Immature Granulocytes 0.11 (*)    All other components within normal limits  RESP PANEL BY RT-PCR (FLU A&B, COVID) ARPGX2  CULTURE, BLOOD (ROUTINE X 2)  CULTURE, BLOOD (ROUTINE X 2)  LIPASE, BLOOD  HIV ANTIBODY (ROUTINE TESTING W REFLEX)  MAGNESIUM  MAGNESIUM    EKG EKG Interpretation  Date/Time:  Wednesday August 08 2020 12:11:21 EDT Ventricular Rate:  99 PR Interval:  150 QRS Duration: 88 QT Interval:  308 QTC Calculation: 395 R Axis:   -35 Text Interpretation: Normal sinus rhythm Left axis deviation Minimal voltage criteria for LVH, may be normal variant ( Cornell product ) Nonspecific T wave abnormality Abnormal ECG Rate faster Confirmed by Ezequiel Essex [phone]) on 08/08/2020 10:46:09 PM  Radiology No results found.  Procedures .Critical Care  Date/Time: 08/27/2020 8:45 AM Performed by: Joanne Gavel, PA-C Authorized by: Joanne Gavel, PA-C   Critical care provider statement:    Critical care time (minutes):  35   Critical care time was exclusive of:  Separately billable procedures and treating other patients and teaching time   Critical care was necessary to treat or prevent imminent or life-threatening deterioration of the following conditions:  Sepsis   Critical care was time spent personally by me on the following activities:  Ordering and performing treatments and interventions, ordering and review of laboratory studies, ordering and review of radiographic studies, pulse oximetry, re-evaluation of patient's condition, review of old charts, obtaining history from patient or surrogate, examination of patient, evaluation of patient's response to treatment and development of treatment plan with patient or surrogate   I assumed direction of critical care for this patient from another provider in my specialty: no      Care discussed with: admitting provider     Medications Ordered in ED Medications  HYDROmorphone (DILAUDID) injection 0.5 mg (0.5 mg Intravenous Given 08/09/20 0126)  pantoprazole (PROTONIX) injection 40 mg (40 mg Intravenous Given 08/09/20 0126)  metoCLOPramide (REGLAN) injection 5 mg (5 mg Intravenous Given 08/09/20 0127)  iohexol (OMNIPAQUE) 300 MG/ML solution 100 mL (100 mLs Intravenous Contrast Given 08/09/20 0128)  cefTRIAXone (ROCEPHIN) 1 g in sodium chloride 0.9 %  100 mL IVPB (0 g Intravenous Stopped 08/09/20 0515)  chlorproMAZINE (THORAZINE) 12.5 mg in sodium chloride 0.9 % 25 mL IVPB (0 mg Intravenous Stopped 08/09/20 0755)  potassium chloride (KLOR-CON) CR tablet 50 mEq (50 mEq Oral Given 08/09/20 0927)  chlorproMAZINE (THORAZINE) 25 mg in sodium chloride 0.9 % 25 mL IVPB (0 mg Intravenous Stopped 08/09/20 1608)    ED Course  I have reviewed the triage vital signs and the nursing notes.  Pertinent labs & imaging results that were available during my care of the patient were reviewed by me and considered in my medical decision making (see chart for details).    MDM Rules/Calculators/A&P                          55 year old male with a history of ESRD (M/W/F), anemia, COPD, psoriasis, nonischemic cardiomyopathy, pulmonary hypertension, hyperlipidemia, HTN, secondary hyperparathyroidism who presents the emergency department with a chief complaint of hiccups.  Patient has had over his persistent hiccups for the last 5 days.  He is feeling more short of breath when he has hiccups and feels as if he is gasping for air.  He reports associated chills, nausea, vomiting, and diarrhea.  He was found to be febrile to 102.8 upon arrival to the emergency department.  The patient was seen and independently evaluated by Dr. Wyvonnia Dusky, attending physician.  On exam, patient is actively hiccuping.  He is exquisitely tender to palpation in the upper abdomen.   Labs and imaging have been reviewed and independently  interpreted by me.  COVID-19 test is negative.  He has no leukocytosis.  Hemoglobin is stable.  He has mild hypokalemia.  Creatinine is 8.  He does not make urine.  Lipase is normal.  No elevation of transaminases, bilirubin, or alkaline phosphatase.  CT abdomen pelvis with concern for pneumonia in the left lung base.  We will start the patient on Rocephin since he meets sepsis criteria.  No indication for 30 cc/kg bolus given his history of dialysis with normal lactate.  Blood cultures x2 obtained prior to antibiotic administration.  He was given Reglan and Protonix for hiccuping, but continues to have recurrent episodes.  EKG is unremarkable.  He has had no chest pain.  On reevaluation, patient reports significant improvement in abdominal pain with Dilaudid and some improvement in frequency of hiccuping.  Consult to the hospitalist team and Dr. Nevada Crane will accept the patient for admission. The patient appears reasonably stabilized for admission considering the current resources, flow, and capabilities available in the ED at this time, and I doubt any other [facility] requiring further screening and/or treatment in the ED prior to admission.  Final Clinical Impression(s) / ED Diagnoses Final diagnoses:  Sepsis without acute organ dysfunction, due to unspecified organism [facility])  Pneumonia of left lower lobe due to infectious organism  RUQ pain    Rx / DC Orders ED Discharge Orders          Ordered    azithromycin (ZITHROMAX) 500 MG tablet  Daily,   Status:  Discontinued        08/10/20 1209    cefdinir (OMNICEF) 300 MG capsule  Status:  Discontinued        08/10/20 1209    chlorproMAZINE (THORAZINE) 10 MG tablet  3 times daily PRN,   Status:  Discontinued        08/10/20 1209    predniSONE (DELTASONE) 50 MG tablet  Status:  Discontinued  08/10/20 1209    Ipratropium-Albuterol (COMBIVENT) 20-100 MCG/ACT AERS respimat  Every 6 hours PRN,   Status:  Discontinued        08/10/20 1209     azithromycin (ZITHROMAX) 500 MG tablet  Daily        08/10/20 1234    cefdinir (OMNICEF) 300 MG capsule        08/10/20 1234    chlorproMAZINE (THORAZINE) 10 MG tablet  3 times daily PRN        08/10/20 1234    predniSONE (DELTASONE) 50 MG tablet        08/10/20 1234    Ipratropium-Albuterol (COMBIVENT) 20-100 MCG/ACT AERS respimat  Every 6 hours PRN        08/10/20 1234             Leyton Brownlee A, PA-C 08/09/20 0438    Ezequiel Essex, MD 08/09/20 0509    Joanne Gavel, PA-C 08/27/20 8022    Ezequiel Essex, MD 08/28/20 [phone]

## 2020-08-08 NOTE — Progress Notes (Signed)
Pharmacy Antibiotic Note  James Wall is a 55 y.o. male admitted on 08/08/2020 with  intra-abd infection .  Pharmacy has been consulted for Zosyn dosing.  Plan: Zosyn 2.25gm IV q8h Will f/u micro data and pt's clinical condition     Temp (24hrs), Avg:102.8 F (39.3 C), Min:102.8 F (39.3 C), Max:102.8 F (39.3 C)  Recent Labs  Lab 08/08/20 1226  WBC 8.6  CREATININE 8.04*    Estimated Creatinine Clearance: 10.1 mL/min (A) (by C-G formula based on SCr of 8.04 mg/dL (H)).    Allergies  Allergen Reactions   Venofer  [Ferric Oxide] Itching   Aspirin Other (See Comments)    Made congestion worse    Antimicrobials this admission: 7/6 Zosyn >>   Microbiology results: Pending  Thank you for allowing pharmacy to be a part of this patient's care.  Sherlon Handing, PharmD, BCPS Please see amion for complete clinical pharmacist phone list 08/08/2020 11:27 PM

## 2020-08-09 ENCOUNTER — Other Ambulatory Visit: Payer: Self-pay

## 2020-08-09 ENCOUNTER — Observation Stay (HOSPITAL_COMMUNITY): Payer: Medicare Other

## 2020-08-09 ENCOUNTER — Encounter (HOSPITAL_COMMUNITY): Payer: Self-pay | Admitting: Internal Medicine

## 2020-08-09 ENCOUNTER — Emergency Department (HOSPITAL_COMMUNITY): Payer: Medicare Other

## 2020-08-09 DIAGNOSIS — J189 Pneumonia, unspecified organism: Secondary | ICD-10-CM | POA: Diagnosis not present

## 2020-08-09 DIAGNOSIS — R109 Unspecified abdominal pain: Secondary | ICD-10-CM | POA: Diagnosis not present

## 2020-08-09 LAB — RENAL FUNCTION PANEL
Albumin: 2.6 g/dL — ABNORMAL LOW (ref 3.5–5.0)
Anion gap: 13 (ref 5–15)
BUN: 30 mg/dL — ABNORMAL HIGH (ref 6–20)
CO2: 29 mmol/L (ref 22–32)
Calcium: 9.4 mg/dL (ref 8.9–10.3)
Chloride: 91 mmol/L — ABNORMAL LOW (ref 98–111)
Creatinine, Ser: 10.46 mg/dL — ABNORMAL HIGH (ref 0.61–1.24)
GFR, Estimated: 5 mL/min — ABNORMAL LOW (ref >60)
Glucose, Bld: 113 mg/dL — ABNORMAL HIGH (ref 70–99)
Phosphorus: 3.3 mg/dL (ref 2.5–4.6)
Potassium: 3.6 mmol/L (ref 3.5–5.1)
Sodium: 133 mmol/L — ABNORMAL LOW (ref 135–145)

## 2020-08-09 LAB — CBC WITH DIFFERENTIAL/PLATELET
Abs Immature Granulocytes: 0.09 10*3/uL — ABNORMAL HIGH (ref 0.00–0.07)
Basophils Absolute: 0 10*3/uL (ref 0.0–0.1)
Basophils Relative: 0 %
Eosinophils Absolute: 0.1 10*3/uL (ref 0.0–0.5)
Eosinophils Relative: 1 %
HCT: 29.2 % — ABNORMAL LOW (ref 39.0–52.0)
Hemoglobin: 8.8 g/dL — ABNORMAL LOW (ref 13.0–17.0)
Immature Granulocytes: 1 %
Lymphocytes Relative: 9 %
Lymphs Abs: 0.8 10*3/uL (ref 0.7–4.0)
MCH: 23.6 pg — ABNORMAL LOW (ref 26.0–34.0)
MCHC: 30.1 g/dL (ref 30.0–36.0)
MCV: 78.3 fL — ABNORMAL LOW (ref 80.0–100.0)
Monocytes Absolute: 1.1 10*3/uL — ABNORMAL HIGH (ref 0.1–1.0)
Monocytes Relative: 12 %
Neutro Abs: 7.1 10*3/uL (ref 1.7–7.7)
Neutrophils Relative %: 77 %
Platelets: 209 10*3/uL (ref 150–400)
RBC: 3.73 MIL/uL — ABNORMAL LOW (ref 4.22–5.81)
RDW: 20.6 % — ABNORMAL HIGH (ref 11.5–15.5)
WBC: 9.2 10*3/uL (ref 4.0–10.5)
nRBC: 0 % (ref 0.0–0.2)

## 2020-08-09 LAB — MAGNESIUM: Magnesium: 1.8 mg/dL (ref 1.7–2.4)

## 2020-08-09 LAB — HIV ANTIBODY (ROUTINE TESTING W REFLEX): HIV Screen 4th Generation wRfx: NONREACTIVE

## 2020-08-09 MED ORDER — AMLODIPINE BESYLATE 10 MG PO TABS
10.0000 mg | ORAL_TABLET | Freq: Every day | ORAL | Status: DC
  Administered 2020-08-09 – 2020-08-10 (×2): 10 mg via ORAL
  Filled 2020-08-09: qty 2
  Filled 2020-08-09: qty 1

## 2020-08-09 MED ORDER — ISOSORBIDE DINITRATE 20 MG PO TABS
20.0000 mg | ORAL_TABLET | Freq: Two times a day (BID) | ORAL | Status: DC
  Administered 2020-08-09 – 2020-08-10 (×3): 20 mg via ORAL
  Filled 2020-08-09 (×6): qty 1

## 2020-08-09 MED ORDER — SODIUM CHLORIDE 0.9 % IV SOLN
12.5000 mg | INTRAVENOUS | Status: AC
  Administered 2020-08-09: 12.5 mg via INTRAVENOUS
  Filled 2020-08-09: qty 0.5

## 2020-08-09 MED ORDER — HYDRALAZINE HCL 25 MG PO TABS
25.0000 mg | ORAL_TABLET | Freq: Two times a day (BID) | ORAL | Status: DC
  Administered 2020-08-09 – 2020-08-10 (×4): 25 mg via ORAL
  Filled 2020-08-09 (×4): qty 1

## 2020-08-09 MED ORDER — IOHEXOL 300 MG/ML  SOLN
100.0000 mL | Freq: Once | INTRAMUSCULAR | Status: AC | PRN
  Administered 2020-08-09: 100 mL via INTRAVENOUS

## 2020-08-09 MED ORDER — SEVELAMER CARBONATE 800 MG PO TABS
1600.0000 mg | ORAL_TABLET | Freq: Three times a day (TID) | ORAL | Status: DC
  Administered 2020-08-09: 2400 mg via ORAL
  Administered 2020-08-10: 1600 mg via ORAL
  Filled 2020-08-09: qty 3
  Filled 2020-08-09: qty 2
  Filled 2020-08-09: qty 4

## 2020-08-09 MED ORDER — HEPARIN SODIUM (PORCINE) 5000 UNIT/ML IJ SOLN
5000.0000 [IU] | Freq: Three times a day (TID) | INTRAMUSCULAR | Status: DC
  Administered 2020-08-09 (×2): 5000 [IU] via SUBCUTANEOUS
  Filled 2020-08-09 (×2): qty 1

## 2020-08-09 MED ORDER — UMECLIDINIUM-VILANTEROL 62.5-25 MCG/INH IN AEPB
1.0000 | INHALATION_SPRAY | Freq: Every day | RESPIRATORY_TRACT | Status: DC
  Administered 2020-08-10: 1 via RESPIRATORY_TRACT
  Filled 2020-08-09: qty 14

## 2020-08-09 MED ORDER — SODIUM CHLORIDE 0.9 % IV SOLN
1.0000 g | INTRAVENOUS | Status: DC
  Administered 2020-08-10: 1 g via INTRAVENOUS
  Filled 2020-08-09: qty 1
  Filled 2020-08-09: qty 10

## 2020-08-09 MED ORDER — POTASSIUM CHLORIDE CRYS ER 20 MEQ PO TBCR
50.0000 meq | EXTENDED_RELEASE_TABLET | Freq: Once | ORAL | Status: AC
  Administered 2020-08-09: 50 meq via ORAL
  Filled 2020-08-09: qty 2

## 2020-08-09 MED ORDER — METHYLPREDNISOLONE SODIUM SUCC 125 MG IJ SOLR
60.0000 mg | Freq: Two times a day (BID) | INTRAMUSCULAR | Status: DC
  Administered 2020-08-09 – 2020-08-10 (×3): 60 mg via INTRAVENOUS
  Filled 2020-08-09 (×3): qty 2

## 2020-08-09 MED ORDER — CLONIDINE HCL 0.3 MG/24HR TD PTWK: 0.3000 mg | MEDICATED_PATCH | TRANSDERMAL | Status: DC

## 2020-08-09 MED ORDER — SODIUM CHLORIDE 0.9 % IV SOLN
500.0000 mg | INTRAVENOUS | Status: DC
  Administered 2020-08-09: 500 mg via INTRAVENOUS
  Filled 2020-08-09 (×2): qty 500

## 2020-08-09 MED ORDER — IPRATROPIUM-ALBUTEROL 0.5-2.5 (3) MG/3ML IN SOLN
3.0000 mL | Freq: Four times a day (QID) | RESPIRATORY_TRACT | Status: DC
  Administered 2020-08-09: 3 mL via RESPIRATORY_TRACT
  Filled 2020-08-09 (×3): qty 3

## 2020-08-09 MED ORDER — METOPROLOL TARTRATE 100 MG PO TABS
100.0000 mg | ORAL_TABLET | Freq: Two times a day (BID) | ORAL | Status: DC
  Administered 2020-08-09 – 2020-08-10 (×3): 100 mg via ORAL
  Filled 2020-08-09 (×2): qty 1
  Filled 2020-08-09: qty 4

## 2020-08-09 MED ORDER — SODIUM CHLORIDE 0.9 % IV SOLN
1.0000 g | Freq: Once | INTRAVENOUS | Status: AC
  Administered 2020-08-09: 1 g via INTRAVENOUS
  Filled 2020-08-09: qty 10

## 2020-08-09 MED ORDER — CHLORPROMAZINE HCL 10 MG PO TABS
10.0000 mg | ORAL_TABLET | Freq: Three times a day (TID) | ORAL | Status: DC | PRN
  Filled 2020-08-09 (×3): qty 1

## 2020-08-09 MED ORDER — TAMSULOSIN HCL 0.4 MG PO CAPS
0.4000 mg | ORAL_CAPSULE | Freq: Every day | ORAL | Status: DC
  Administered 2020-08-09 – 2020-08-10 (×2): 0.4 mg via ORAL
  Filled 2020-08-09 (×2): qty 1

## 2020-08-09 MED ORDER — PANTOPRAZOLE SODIUM 40 MG PO TBEC
40.0000 mg | DELAYED_RELEASE_TABLET | Freq: Two times a day (BID) | ORAL | Status: DC
  Administered 2020-08-09 – 2020-08-10 (×3): 40 mg via ORAL
  Filled 2020-08-09 (×3): qty 1

## 2020-08-09 MED ORDER — SODIUM CHLORIDE 0.9 % IV SOLN
25.0000 mg | Freq: Once | INTRAVENOUS | Status: AC
  Administered 2020-08-09: 25 mg via INTRAVENOUS
  Filled 2020-08-09: qty 1

## 2020-08-09 NOTE — Progress Notes (Signed)
PROGRESS NOTE    James Wall  OZH:[id number] DOB: November 26, 1965 DOA: 08/08/2020 PCP: [name], MD     Brief Narrative:  55 y.o.  BM PMHx Depression, Anxiety, ESRD on HD M/W/F, NICM, PSVT, pulmonary HTN, HTN, BPH, anemia, Secondary Hyperparathyroidism, OSA (not on CPAP)  Presented to [facility] ED with complaints of 3 days of productive cough and intractable hiccups.  Associated with abdominal pain.  No exacerbating or improving factors.  Work-up in the ED revealed early left lower lobe pneumonia.  Started on Rocephin in the ED.  COVID-19 screening test negative.  TRH, hospitalist team, was asked to admit.   ED Course: T-max 102.8.  BP 176/92, respiration rate 18, pulse 78.  O2 saturation 100% on room air.  Lab studies remarkable for serum sodium 133, potassium 3.1, glucose 124, BUN 18, creatinine 8.04, total bilirubin 1.3, hemoglobin 9.2, WBC 8.6, MCV 77, platelet count 201.   Review of Systems: Review of systems as noted in the HPI. All other systems reviewed and are negative.   Subjective:    Assessment & Plan: Covid vaccination;   Active Problems:   CAP (community acquired pneumonia)    CAP Early left lower lobe, POA:  Presented with fever T-max 102.8, left lower lobe infiltrates on CT scan -Complete 5 to 7-day course antibiotics.   -Pulmonary toilet -DuoNeb  QID - Solu-Medrol 60 mg BID -Mobilize as tolerated - Flutter valve - Incentive spirometry - Respiratory virus panel, sputum pending   Intractable hiccups -7/7 Thorazine IV 25 mg x 1    ESRD on HD M/W/F -Last hemodialysis was on Wednesday. -HD per nephrology .   Essential HTN  -Amlodipine 10 mg daily - Clonidine patch 0.3 mg - Hydralazine 25 mg  BID -Isordil 20 mg  BID -Metoprolol 100 mg  BID    BPH -Flomax 0.4 mg   Hypokalemia - Potassium goal> 4 - K-Dur 50 mEq -Recheck K/Mg @ 1600       DVT prophylaxis: Subcu heparin Code Status: Full Family Communication:  Status is:  Inpatient    Dispo: The patient is from: Home              Anticipated d/c is to: Home              Patient currently, not stable for discharge.              Difficult to place patient, not applicable.        Consultants:  Nephrology   Procedures/Significant Events:    I have personally reviewed and interpreted all radiology studies and my findings are as above.  VENTILATOR SETTINGS:    Cultures 7/6/coronavirus negative 7/6 influenza A/B negative 7/7 respiratory virus panel pending 7/7 sputum pending  Antimicrobials: Anti-infectives (From admission, onward)    Start     Ordered Stop   08/09/20 2200  cefTRIAXone (ROCEPHIN) 1 g in sodium chloride 0.9 % 100 mL IVPB        08/09/20 0541     08/09/20 2115  azithromycin (ZITHROMAX) 500 mg in sodium chloride 0.9 % 250 mL IVPB        08/09/20 2023     08/09/20 0315  cefTRIAXone (ROCEPHIN) 1 g in sodium chloride 0.9 % 100 mL IVPB        08/09/20 0305 08/09/20 0515   08/08/20 2330  piperacillin-tazobactam (ZOSYN) IVPB 2.25 g  Status:  Discontinued        08/08/20 2329 08/09/20 0306  Devices    LINES / TUBES:      Continuous Infusions:  cefTRIAXone (ROCEPHIN)  IV       Objective: Vitals:   08/09/20 0410 08/09/20 0420 08/09/20 0429 08/09/20 0750  BP: (!) 164/98  (!) 176/92 (!) 187/95  Pulse: 83  78 81  Resp: 18  18 17   Temp:    98.5 F (36.9 C)  TempSrc:      SpO2: 92%  100% 93%  Weight:  81.6 kg    Height:  5' 5"  (1.651 m)     No intake or output data in the 24 hours ending 08/09/20 0831 Filed Weights   08/09/20 0420  Weight: 81.6 kg    Examination:  Patient seen and examined by [facility] physician on 08/09/2020.  No charge .     Data Reviewed: Care during the described time interval was provided by me .  I have reviewed this patient's available data, including medical history, events of note, physical examination, and all test results as part of my evaluation.  CBC: Recent Labs  Lab  08/08/20 1226  WBC 8.6  NEUTROABS 7.0  HGB 9.2*  HCT 29.4*  MCV 77.0*  PLT 937   Basic Metabolic Panel: Recent Labs  Lab 08/08/20 1226  NA 133*  K 3.1*  CL 90*  CO2 30  GLUCOSE 124*  BUN 18  CREATININE 8.04*  CALCIUM 9.4   GFR: Estimated Creatinine Clearance: 10.1 mL/min (A) (by C-G formula based on SCr of 8.04 mg/dL (H)). Liver Function Tests: Recent Labs  Lab 08/08/20 1226  AST 16  ALT 9  ALKPHOS 45  BILITOT 1.3*  PROT 7.8  ALBUMIN 3.0*   Recent Labs  Lab 08/08/20 1226  LIPASE 22   No results for input(s): AMMONIA in the last 168 hours. Coagulation Profile: No results for input(s): INR, PROTIME in the last 168 hours. Cardiac Enzymes: No results for input(s): CKTOTAL, CKMB, CKMBINDEX, TROPONINI in the last 168 hours. BNP (last 3 results) No results for input(s): PROBNP in the last 8760 hours. HbA1C: No results for input(s): HGBA1C in the last 72 hours. CBG: No results for input(s): GLUCAP in the last 168 hours. Lipid Profile: No results for input(s): CHOL, HDL, LDLCALC, TRIG, CHOLHDL, LDLDIRECT in the last 72 hours. Thyroid Function Tests: No results for input(s): TSH, T4TOTAL, FREET4, T3FREE, THYROIDAB in the last 72 hours. Anemia Panel: No results for input(s): VITAMINB12, FOLATE, FERRITIN, TIBC, IRON, RETICCTPCT in the last 72 hours. Sepsis Labs: No results for input(s): PROCALCITON, LATICACIDVEN in the last 168 hours.  Recent Results (from the past 240 hour(s))  Resp Panel by RT-PCR (Flu A&B, Covid) Nasopharyngeal Swab     Status: None   Collection Time: 08/08/20 12:34 PM   Specimen: Nasopharyngeal Swab; Nasopharyngeal(NP) swabs in vial transport medium  Result Value Ref Range Status   SARS Coronavirus 2 by RT PCR NEGATIVE NEGATIVE Final    Comment: (NOTE) SARS-CoV-2 target nucleic acids are NOT DETECTED.  The SARS-CoV-2 RNA is generally detectable in upper respiratory specimens during the acute phase of infection. The lowest concentration  of SARS-CoV-2 viral copies this assay can detect is 138 copies/mL. A negative result does not preclude SARS-Cov-2 infection and should not be used as the sole basis for treatment or other patient management decisions. A negative result may occur with  improper specimen collection/handling, submission of specimen other than nasopharyngeal swab, presence of viral mutation(s) within the areas targeted by this assay, and inadequate number of viral copies(<138 copies/mL).  A negative result must be combined with clinical observations, patient history, and epidemiological information. The expected result is Negative.  Fact Sheet for Patients:  EntrepreneurPulse.com.au  Fact Sheet for Healthcare Providers:  IncredibleEmployment.be  This test is no t yet approved or cleared by the [place] FDA and  has been authorized for detection and/or diagnosis of SARS-CoV-2 by FDA under an Emergency Use Authorization (EUA). This EUA will remain  in effect (meaning this test can be used) for the duration of the COVID-19 declaration under Section 564(b)(1) of the Act, 21 U.S.C.section 360bbb-3(b)(1), unless the authorization is terminated  or revoked sooner.       Influenza A by PCR NEGATIVE NEGATIVE Final   Influenza B by PCR NEGATIVE NEGATIVE Final    Comment: (NOTE) The Xpert Xpress SARS-CoV-2/FLU/RSV plus assay is intended as an aid in the diagnosis of influenza from Nasopharyngeal swab specimens and should not be used as a sole basis for treatment. Nasal washings and aspirates are unacceptable for Xpert Xpress SARS-CoV-2/FLU/RSV testing.  Fact Sheet for Patients: EntrepreneurPulse.com.au  Fact Sheet for Healthcare Providers: IncredibleEmployment.be  This test is not yet approved or cleared by the [place] FDA and has been authorized for detection and/or diagnosis of SARS-CoV-2 by FDA under an Emergency Use  Authorization (EUA). This EUA will remain in effect (meaning this test can be used) for the duration of the COVID-19 declaration under Section 564(b)(1) of the Act, 21 U.S.C. section 360bbb-3(b)(1), unless the authorization is terminated or revoked.  Performed at [facility], [place] [address] [place],  [address]          Radiology Studies: DG Chest 2 View  Result Date: 08/08/2020 CLINICAL DATA:  Cough, shortness of breath with fever and hiccups in a 55 year old male. EXAM: CHEST - 2 VIEW COMPARISON:  December 24, 2017. FINDINGS: Trachea is midline. Cardiomediastinal contours and hilar structures are normal. RIGHT-sided IJ dialysis catheter terminates in the RIGHT atrium as before. No lobar consolidation.  No sign of pleural effusion. On limited assessment no acute skeletal process. IMPRESSION: No acute cardiopulmonary disease. Electronically Signed   By: Zetta Bills M.D.   On: 08/08/2020 13:17   CT ABDOMEN PELVIS W CONTRAST  Result Date: 08/09/2020 CLINICAL DATA:  Abdominal pain and fever. Hiccups, shortness of breath, and nausea. EXAM: CT ABDOMEN AND PELVIS WITH CONTRAST TECHNIQUE: Multidetector CT imaging of the abdomen and pelvis was performed using the standard protocol following bolus administration of intravenous contrast. CONTRAST:  16m OMNIPAQUE IOHEXOL 300 MG/ML  SOLN COMPARISON:  03/08/2017 FINDINGS: Lower chest: Patchy infiltration in the left lung base, possibly early pneumonia. Left lung base air cyst. Cardiac enlargement. Hepatobiliary: No focal liver abnormality is seen. No gallstones, gallbladder wall thickening, or biliary dilatation. Pancreas: Unremarkable. No pancreatic ductal dilatation or surrounding inflammatory changes. Spleen: Normal in size without focal abnormality. Adrenals/Urinary Tract: No adrenal gland nodules. Numerous cysts in both kidneys consistent with polycystic renal disease. Scattered calcifications likely representing calcifications in the  cyst walls. No hydronephrosis or hydroureter. Bladder is decompressed. Stomach/Bowel: Stomach is within normal limits. Appendix appears normal. No evidence of bowel wall thickening, distention, or inflammatory changes. Vascular/Lymphatic: Aortic atherosclerosis. No enlarged abdominal or pelvic lymph nodes. Reproductive: Prostate gland is enlarged. Other: No abdominal wall hernia or abnormality. No abdominopelvic ascites. 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 No focal lesions. IMPRESSION: 1. Patchy infiltration in the left lung base suggest early pneumonia. 2. Polycystic renal disease.  No hydronephrosis or hydroureter. 3. Prostate  gland is enlarged. 4. Aortic atherosclerosis. 5. 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 Electronically Signed   By: Lucienne Capers M.D.   On: 08/09/2020 01:46   US Abdomen Limited RUQ (LIVER/GB)  Result Date: 08/09/2020 CLINICAL DATA:  55 year old male with history of right upper quadrant abdominal pain since Saturday. EXAM: ULTRASOUND ABDOMEN LIMITED RIGHT UPPER QUADRANT COMPARISON:  Abdominal ultrasound 12/07/2018. FINDINGS: Gallbladder: No gallstones or wall thickening visualized. No sonographic Murphy sign noted by sonographer. Common bile duct: Diameter: 2 mm Liver: No focal lesion identified. Liver has a nodular contour with prominence of the caudate lobe and coarse echotexture, indicative of underlying cirrhosis. Portal vein is patent on color Doppler imaging with normal direction of blood flow towards the liver. Other: Atrophic partially imaged right kidney with multiple small cysts. IMPRESSION: 1. Negative for cholelithiasis or findings to suggest acute cholecystitis. 2. Cirrhosis. Electronically Signed   By: Vinnie Langton M.D.   On: 08/09/2020 06:16        Scheduled Meds:  acetaminophen  650 mg Oral Once   amLODipine  10 mg Oral Daily   [START ON 08/12/2020] cloNIDine  0.3 mg Transdermal Q Sun    heparin  5,000 Units Subcutaneous Q8H   hydrALAZINE  25 mg Oral BID   ipratropium-albuterol  3 mL Nebulization Q6H   isosorbide dinitrate  20 mg Oral BID   metoprolol tartrate  100 mg Oral BID   pantoprazole  40 mg Oral BID AC   sevelamer carbonate  1,600-3,200 mg Oral TID WC   tamsulosin  0.4 mg Oral Daily   umeclidinium-vilanterol  1 puff Inhalation Daily   Continuous Infusions:  cefTRIAXone (ROCEPHIN)  IV       LOS: 0 days    Time spent:40 min    Amoni Scallan, Geraldo Docker, MD [facility]   If 7PM-7AM, please contact night-coverage 08/09/2020, 8:31 AM

## 2020-08-09 NOTE — H&P (Signed)
History and Physical  Kalan Yeley KYH:[id number] DOB: January 10, 1966 DOA: 08/08/2020  Referring physician: Joline Maxcy, PA/EDP. PCP: [name], MD  Outpatient Specialists: Nephrology Patient coming from: Home.   Chief Complaint: Cough, hiccups x3 days.  HPI: Letroy Vazguez is a 55 y.o. male with medical history significant for ESRD on HD MWF, hypertension, GERD, BPH who presented to [facility] ED with complaints of 3 days of productive cough and intractable hiccups.  Associated with abdominal pain.  No exacerbating or improving factors.  Work-up in the ED revealed early left lower lobe pneumonia.  Started on Rocephin in the ED.  COVID-19 screening test negative.  TRH, hospitalist team, was asked to admit.  ED Course: T-max 102.8.  BP 176/92, respiration rate 18, pulse 78.  O2 saturation 100% on room air.  Lab studies remarkable for serum sodium 133, potassium 3.1, glucose 124, BUN 18, creatinine 8.04, total bilirubin 1.3, hemoglobin 9.2, WBC 8.6, MCV 77, platelet count 201.  Review of Systems: Review of systems as noted in the HPI. All other systems reviewed and are negative.   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  ESRD was due to HTN.  Dx'd with HTN in hospital 1996 according to pt, they had to keep him so he could get Medicaid to afford the BP medications.  First saw a nephrologist and started HD in the same year 2009.  Gets HD at [facility] on a MWF schedule.  Does not have DM. He had a left RC AVF that never functioned, a left upper arm AVF that worked for about 5 years and as of June   GERD (gastroesophageal reflux disease)    Heart murmur    as a  child per pt   Hyperlipidemia    Hypertension    NICM (nonischemic cardiomyopathy) ([place])    PSVT (paroxysmal supraventricular tachycardia) ([place])    Pulmonary hypertension ([place])    PVC's (premature ventricular contractions)    Secondary hyperparathyroidism ([place])    Sepsis ([place]) 02/2013   from AVF , treated with Vancomycin.   Sleep apnea    no  longer using cpap   Past Surgical History:  Procedure Laterality Date   AV FISTULA PLACEMENT  2009   Left lower arm AVF   AV FISTULA PLACEMENT Right 02/22/2013   Procedure:  CREATION  OF BRACHIAL CEPHALIC FISTULA RIGHT ARM;  Surgeon: Elam Dutch, MD;  Location: [facility] OR;  Service: Vascular;  Laterality: Right;   AV FISTULA PLACEMENT Left 08/10/2014   Procedure: BASILIC VEIN TRANSPOSITION  ARTERIOVENOUS (AV) FISTULA CREATION LEFT UPPER ARM;  Surgeon: Mal Misty, MD;  Location: [facility] OR;  Service: Vascular;  Laterality: Left;   COLONOSCOPY     ESOPHAGOGASTRODUODENOSCOPY (EGD) WITH PROPOFOL N/A 04/12/2013   Procedure: ESOPHAGOGASTRODUODENOSCOPY (EGD) WITH PROPOFOL;  Surgeon: Arta Silence, MD;  Location: [facility] ENDOSCOPY;  Service: Endoscopy;  Laterality: N/A;   INSERTION OF DIALYSIS CATHETER N/A 12/23/2012   Procedure: INSERTION OF DIALYSIS CATHETER; ULTRASOUND GUIDED;  Surgeon: Angelia Mould, MD;  Location: [place];  Service: Vascular;  Laterality: N/A;  INSERTION OF DIALYSIS CATHETER  10/22/2015   Right IJ non-tunneled HD catheter, placed again in 2019   IR FLUORO GUIDE CV LINE RIGHT  08/18/2019   IR FLUORO GUIDE CV LINE RIGHT  10/07/2019   IR GENERIC HISTORICAL  10/22/2015   IR US GUIDE VASC ACCESS RIGHT 10/22/2015 [facility]   IR GENERIC HISTORICAL  10/22/2015   IR FLUORO GUIDE CV LINE RIGHT 10/22/2015 [facility]   IR GENERIC HISTORICAL  10/23/2015   IR FLUORO GUIDE CV LINE RIGHT 10/23/2015 Marybelle Killings, MD [facility]   LEFT HEART CATHETERIZATION WITH CORONARY ANGIOGRAM N/A 07/13/2013   Procedure: LEFT HEART CATHETERIZATION WITH CORONARY  ANGIOGRAM;  Surgeon: Jettie Booze, MD;  Location: [facility] CATH LAB;  Service: Cardiovascular;  Laterality: N/A;   LIGATION OF ARTERIOVENOUS  FISTULA Left 12/22/2012   Procedure: LIGATION OF ARTERIOVENOUS  FISTULA;EXCISION OF LARGE ANEURYSMS;;  Surgeon: Elam Dutch, MD;  Location: [place];  Service: Vascular;  Laterality: Left;   UPPER GASTROINTESTINAL ENDOSCOPY      Social History:  reports that he has been smoking cigarettes. He has a 8.00 pack-year smoking history. He has never used smokeless tobacco. He reports current alcohol use. He reports current drug use. Frequency: 2.00 times per week. Drug: Marijuana.   Allergies  Allergen Reactions   Venofer  [Ferric Oxide] Itching   Aspirin Other (See Comments)    Made congestion worse    Family History  Problem Relation Age of Onset   Hypertension Mother    Cerebrovascular Accident Father    Hypertension Father    Congestive Heart Failure Brother    Asthma Brother    Stomach cancer Neg Hx    Rectal cancer Neg Hx    Esophageal cancer Neg Hx    Colon cancer Neg Hx       Prior to Admission medications   Medication Sig Start Date End Date Taking? Authorizing Provider  acetaminophen (TYLENOL) 650 MG CR tablet Take 1 tablet (650 mg total) by mouth daily as needed for pain. 03/17/17  Yes Mariel Aloe, MD  amLODipine (NORVASC) 10 MG tablet TAKE 1 TABLET(10 MG) BY MOUTH DAILY 04/26/18  Yes Allred, Jeneen Rinks, MD  cloNIDine (CATAPRES - DOSED IN MG/24 HR) 0.3 mg/24hr patch Place 0.3 mg onto the skin every Sunday. 12/29/18  Yes [provider]  Doxercalciferol ([place] IV) Dialysis Monday,Wednesday,friday 05/30/20 06/24/21 Yes [provider]  hydrALAZINE (APRESOLINE) 25 MG tablet Take 25 mg by mouth 2 (two) times daily.   Yes [provider]  hydrOXYzine (ATARAX/VISTARIL) 25 MG tablet Take 1 tablet (25 mg total) by mouth every 8 (eight) hours as needed for itching. Further refills need to come from your PCP. 04/23/17   Yes Dorothy Spark, MD  isosorbide dinitrate (ISORDIL) 20 MG tablet TAKE 1 TABLET(20 MG) BY MOUTH TWICE DAILY Patient taking differently: Take 20 mg by mouth 2 (two) times daily. 08/31/19  Yes Dorothy Spark, MD  Methoxy PEG-Epoetin Beta (MIRCERA IJ) Dialysis Monday,Wednesday,friday 08/24/19 07/31/21 Yes [provider]  metoprolol tartrate (LOPRESSOR) 100 MG tablet Take 1 tablet by mouth twice a day.  ON DIALYSIS DAYS, TAKE AFTER DIALYSIS. 02/17/20  Yes Dorothy Spark, MD  omeprazole (PRILOSEC) 40 MG capsule Take 40 mg by mouth in the morning and at bedtime. 07/09/20  Yes [provider]  pantoprazole (PROTONIX) 40 MG tablet Take 1 tablet (40 mg total) by mouth 2 (two) times daily before a meal. 03/10/19  Yes Thornton Park, MD  sevelamer  carbonate (RENVELA) 800 MG tablet Take 1,600-3,200 mg by mouth See admin instructions. 2-4 tabs with meals depending on meal size   Yes [provider]  tamsulosin (FLOMAX) 0.4 MG CAPS capsule Take 0.4 mg by mouth daily. 08/01/14  Yes [provider]  umeclidinium-vilanterol (ANORO ELLIPTA) 62.5-25 MCG/INH AEPB Inhale 1 puff into the lungs daily. 09/20/19  Yes Autry-Lott, Naaman Plummer, DO  diclofenac Sodium (VOLTAREN) 1 % GEL Apply 2 g topically 4 (four) times daily. Patient not taking: Reported on 08/09/2020 06/05/20   Autry-Lott, Naaman Plummer, DO    Physical Exam: BP (!) 213/108 (BP Location: Right Arm)   Pulse 70   Temp 98.5 F (36.9 C) (Oral)   Resp 19   SpO2 100%   General: 55 y.o. year-old male well developed well nourished in no acute distress.  Alert and oriented x3. Cardiovascular: Regular rate and rhythm with no rubs or gallops.  No thyromegaly or JVD noted.  No lower extremity edema. 2/4 pulses in all 4 extremities. Respiratory: Mild rales at bases no wheezing noted.  Poor respiratory effort. Abdomen: Soft nontender nondistended with normal bowel sounds x4 quadrants. Muskuloskeletal: No cyanosis, clubbing or edema noted  bilaterally Neuro: CN II-XII intact, strength, sensation, reflexes Skin: No ulcerative lesions noted or rashes Psychiatry: Judgement and insight appear normal. Mood is appropriate for condition and setting          Labs on Admission:  Basic Metabolic Panel: Recent Labs  Lab 08/08/20 1226  NA 133*  K 3.1*  CL 90*  CO2 30  GLUCOSE 124*  BUN 18  CREATININE 8.04*  CALCIUM 9.4   Liver Function Tests: Recent Labs  Lab 08/08/20 1226  AST 16  ALT 9  ALKPHOS 45  BILITOT 1.3*  PROT 7.8  ALBUMIN 3.0*   Recent Labs  Lab 08/08/20 1226  LIPASE 22   No results for input(s): AMMONIA in the last 168 hours. CBC: Recent Labs  Lab 08/08/20 1226  WBC 8.6  NEUTROABS 7.0  HGB 9.2*  HCT 29.4*  MCV 77.0*  PLT 201   Cardiac Enzymes: No results for input(s): CKTOTAL, CKMB, CKMBINDEX, TROPONINI in the last 168 hours.  BNP (last 3 results) No results for input(s): BNP in the last 8760 hours.  ProBNP (last 3 results) No results for input(s): PROBNP in the last 8760 hours.  CBG: No results for input(s): GLUCAP in the last 168 hours.  Radiological Exams on Admission: DG Chest 2 View  Result Date: 08/08/2020 CLINICAL DATA:  Cough, shortness of breath with fever and hiccups in a 55 year old male. EXAM: CHEST - 2 VIEW COMPARISON:  December 24, 2017. FINDINGS: Trachea is midline. Cardiomediastinal contours and hilar structures are normal. RIGHT-sided IJ dialysis catheter terminates in the RIGHT atrium as before. No lobar consolidation.  No sign of pleural effusion. On limited assessment no acute skeletal process. IMPRESSION: No acute cardiopulmonary disease. Electronically Signed   By: Zetta Bills M.D.   On: 08/08/2020 13:17   CT ABDOMEN PELVIS W CONTRAST  Result Date: 08/09/2020 CLINICAL DATA:  Abdominal pain and fever. Hiccups, shortness of breath, and nausea. EXAM: CT ABDOMEN AND PELVIS WITH CONTRAST TECHNIQUE: Multidetector CT imaging of the abdomen and pelvis was performed using  the standard protocol following bolus administration of intravenous contrast. CONTRAST:  184m OMNIPAQUE IOHEXOL 300 MG/ML  SOLN COMPARISON:  03/08/2017 FINDINGS: Lower chest: Patchy infiltration in the left lung base, possibly early pneumonia. Left lung base air cyst. Cardiac enlargement. Hepatobiliary: No focal liver abnormality is seen. No  gallstones, gallbladder wall thickening, or biliary dilatation. Pancreas: Unremarkable. No pancreatic ductal dilatation or surrounding inflammatory changes. Spleen: Normal in size without focal abnormality. Adrenals/Urinary Tract: No adrenal gland nodules. Numerous cysts in both kidneys consistent with polycystic renal disease. Scattered calcifications likely representing calcifications in the cyst walls. No hydronephrosis or hydroureter. Bladder is decompressed. Stomach/Bowel: Stomach is within normal limits. Appendix appears normal. No evidence of bowel wall thickening, distention, or inflammatory changes. Vascular/Lymphatic: Aortic atherosclerosis. No enlarged abdominal or pelvic lymph nodes. Reproductive: Prostate gland is enlarged. Other: No abdominal wall hernia or abnormality. No abdominopelvic ascites. 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 No focal lesions. IMPRESSION: 1. Patchy infiltration in the left lung base suggest early pneumonia. 2. Polycystic renal disease.  No hydronephrosis or hydroureter. 3. Prostate gland is enlarged. 4. Aortic atherosclerosis. 5. 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 Electronically Signed   By: Lucienne Capers M.D.   On: 08/09/2020 01:46    EKG: I independently viewed the EKG done and my findings are as followed: Sinus rhythm rate of 99.  Nonspecific ST-T changes.  QTc 395.  Assessment/Plan Present on Admission:  CAP (community acquired pneumonia)  Active Problems:   CAP (community acquired pneumonia)   Early left lower lobe, POA Presented with fever  T-max 102.8, left lower lobe infiltrates on CT scan Started on Rocephin, continue Pulmonary toilet Bronchodilators Mobilize as tolerated  Intractable hiccups Give 1 dose of IV Thorazine then p.o. Thorazine 3 times daily as needed.  ESRD on HD MWF Last hemodialysis was on Wednesday. Volume status and electrolyte management with dialysis.  Hypertension BP is currently not at goal Resume home oral antihypertensives Monitor vital signs.  BPH Monitor urine output   DVT prophylaxis: Subcu heparin 3 times daily  Code Status: Full code  Family Communication: None at bedside  Disposition Plan: Admit to [place] unit.  Consults called: None.  Admission status: Observation status   Status is: Observation    Dispo: The patient is from: Home               Anticipated d/c is to: Home               Patient currently, not stable for discharge.   Difficult to place patient, not applicable.         Kayleen Memos MD [facility] Pager [phone]  If 7PM-7AM, please contact night-coverage [email or web address] Password [email or web address]  08/09/2020, 3:40 AM

## 2020-08-09 NOTE — ED Notes (Signed)
To ct

## 2020-08-09 NOTE — Progress Notes (Signed)
Aware that James Wall has been admitted OBS status with pneumonia. He will be due for dialysis tomorrow.   If he is ready for discharge tomorrow morning, his outpatient HD unit can take him on 2nd shift - would need to be there at 12 NOON.   If he is not ready for discharge tomorrow, then we can dialyze him here and will do formal consult.  Veneta Penton, PA-C [employer] Pager [phone]

## 2020-08-10 ENCOUNTER — Other Ambulatory Visit (HOSPITAL_COMMUNITY): Payer: Self-pay

## 2020-08-10 DIAGNOSIS — A419 Sepsis, unspecified organism: Secondary | ICD-10-CM | POA: Diagnosis not present

## 2020-08-10 DIAGNOSIS — D631 Anemia in chronic kidney disease: Secondary | ICD-10-CM | POA: Diagnosis not present

## 2020-08-10 DIAGNOSIS — R1011 Right upper quadrant pain: Secondary | ICD-10-CM | POA: Diagnosis not present

## 2020-08-10 DIAGNOSIS — Z992 Dependence on renal dialysis: Secondary | ICD-10-CM | POA: Diagnosis not present

## 2020-08-10 DIAGNOSIS — I12 Hypertensive chronic kidney disease with 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: Secondary | ICD-10-CM | POA: Diagnosis not present

## 2020-08-10 DIAGNOSIS — N186 End stage renal disease: Secondary | ICD-10-CM | POA: Diagnosis not present

## 2020-08-10 DIAGNOSIS — N250 Renal osteodystrophy: Secondary | ICD-10-CM | POA: Diagnosis not present

## 2020-08-10 DIAGNOSIS — J189 Pneumonia, unspecified organism: Secondary | ICD-10-CM | POA: Diagnosis not present

## 2020-08-10 DIAGNOSIS — R066 Hiccough: Secondary | ICD-10-CM | POA: Diagnosis not present

## 2020-08-10 LAB — CBC
HCT: 27.1 % — ABNORMAL LOW (ref 39.0–52.0)
Hemoglobin: 8.2 g/dL — ABNORMAL LOW (ref 13.0–17.0)
MCH: 23.6 pg — ABNORMAL LOW (ref 26.0–34.0)
MCHC: 30.3 g/dL (ref 30.0–36.0)
MCV: 77.9 fL — ABNORMAL LOW (ref 80.0–100.0)
Platelets: 268 10*3/uL (ref 150–400)
RBC: 3.48 MIL/uL — ABNORMAL LOW (ref 4.22–5.81)
RDW: 20.6 % — ABNORMAL HIGH (ref 11.5–15.5)
WBC: 12.8 10*3/uL — ABNORMAL HIGH (ref 4.0–10.5)
nRBC: 0 % (ref 0.0–0.2)

## 2020-08-10 LAB — DIFFERENTIAL
Abs Immature Granulocytes: 0.11 10*3/uL — ABNORMAL HIGH (ref 0.00–0.07)
Basophils Absolute: 0 10*3/uL (ref 0.0–0.1)
Basophils Relative: 0 %
Eosinophils Absolute: 0 10*3/uL (ref 0.0–0.5)
Eosinophils Relative: 0 %
Immature Granulocytes: 1 %
Lymphocytes Relative: 5 %
Lymphs Abs: 0.6 10*3/uL — ABNORMAL LOW (ref 0.7–4.0)
Monocytes Absolute: 0.9 10*3/uL (ref 0.1–1.0)
Monocytes Relative: 7 %
Neutro Abs: 11.1 10*3/uL — ABNORMAL HIGH (ref 1.7–7.7)
Neutrophils Relative %: 87 %

## 2020-08-10 LAB — RENAL FUNCTION PANEL
Albumin: 2.8 g/dL — ABNORMAL LOW (ref 3.5–5.0)
Anion gap: 13 (ref 5–15)
BUN: 49 mg/dL — ABNORMAL HIGH (ref 6–20)
CO2: 28 mmol/L (ref 22–32)
Calcium: 10.9 mg/dL — ABNORMAL HIGH (ref 8.9–10.3)
Chloride: 92 mmol/L — ABNORMAL LOW (ref 98–111)
Creatinine, Ser: 12.54 mg/dL — ABNORMAL HIGH (ref 0.61–1.24)
GFR, Estimated: 4 mL/min — ABNORMAL LOW (ref >60)
Glucose, Bld: 146 mg/dL — ABNORMAL HIGH (ref 70–99)
Phosphorus: 3.8 mg/dL (ref 2.5–4.6)
Potassium: 4.6 mmol/L (ref 3.5–5.1)
Sodium: 133 mmol/L — ABNORMAL LOW (ref 135–145)

## 2020-08-10 LAB — MAGNESIUM: Magnesium: 2.1 mg/dL (ref 1.7–2.4)

## 2020-08-10 MED ORDER — CHLORPROMAZINE HCL 10 MG PO TABS: 10.0000 mg | ORAL_TABLET | Freq: Three times a day (TID) | ORAL | 0 refills | Status: DC | PRN

## 2020-08-10 MED ORDER — HEPARIN SODIUM (PORCINE) 1000 UNIT/ML IJ SOLN
INTRAMUSCULAR | Status: AC
  Administered 2020-08-10: 3800 [IU] via INTRAVENOUS_CENTRAL
  Filled 2020-08-10: qty 4

## 2020-08-10 MED ORDER — AZITHROMYCIN 500 MG PO TABS: 500.0000 mg | ORAL_TABLET | Freq: Every day | ORAL | Status: DC

## 2020-08-10 MED ORDER — CHLORPROMAZINE HCL 10 MG PO TABS
10.0000 mg | ORAL_TABLET | Freq: Three times a day (TID) | ORAL | 0 refills | Status: DC | PRN
  Filled 2020-08-10: qty 9, 3d supply, fill #0

## 2020-08-10 MED ORDER — CEFDINIR 300 MG PO CAPS: 300.0000 mg | ORAL_CAPSULE | Freq: Every day | ORAL | Status: DC

## 2020-08-10 MED ORDER — CHLORHEXIDINE GLUCONATE CLOTH 2 % EX PADS
6.0000 | MEDICATED_PAD | Freq: Every day | CUTANEOUS | Status: DC
  Administered 2020-08-10: 6 via TOPICAL

## 2020-08-10 MED ORDER — IPRATROPIUM-ALBUTEROL 20-100 MCG/ACT IN AERS
1.0000 | INHALATION_SPRAY | Freq: Four times a day (QID) | RESPIRATORY_TRACT | 0 refills | Status: DC | PRN
  Filled 2020-08-10: qty 4, 30d supply, fill #0

## 2020-08-10 MED ORDER — PREDNISONE 50 MG PO TABS
ORAL_TABLET | ORAL | 0 refills | Status: DC
  Filled 2020-08-10: qty 5, 5d supply, fill #0

## 2020-08-10 MED ORDER — CEFDINIR 300 MG PO CAPS
ORAL_CAPSULE | ORAL | 0 refills | Status: DC
  Filled 2020-08-10: qty 3, 3d supply, fill #0

## 2020-08-10 MED ORDER — PREDNISONE 50 MG PO TABS: ORAL_TABLET | ORAL | 0 refills | Status: DC

## 2020-08-10 MED ORDER — PENTAFLUOROPROP-TETRAFLUOROETH EX AERO: 1.0000 "application " | INHALATION_SPRAY | CUTANEOUS | Status: DC | PRN

## 2020-08-10 MED ORDER — AZITHROMYCIN 500 MG PO TABS
500.0000 mg | ORAL_TABLET | Freq: Every day | ORAL | 0 refills | Status: DC
  Filled 2020-08-10: qty 5, 5d supply, fill #0

## 2020-08-10 MED ORDER — CEFDINIR 300 MG PO CAPS: ORAL_CAPSULE | ORAL | 0 refills | Status: DC

## 2020-08-10 MED ORDER — LIDOCAINE-PRILOCAINE 2.5-2.5 % EX CREA: 1.0000 "application " | TOPICAL_CREAM | CUTANEOUS | Status: DC | PRN

## 2020-08-10 MED ORDER — HEPARIN SODIUM (PORCINE) 1000 UNIT/ML DIALYSIS: 1000.0000 [IU] | INTRAMUSCULAR | Status: DC | PRN

## 2020-08-10 MED ORDER — LIDOCAINE HCL (PF) 1 % IJ SOLN: 5.0000 mL | INTRAMUSCULAR | Status: DC | PRN

## 2020-08-10 MED ORDER — SODIUM CHLORIDE 0.9 % IV SOLN: 100.0000 mL | INTRAVENOUS | Status: DC | PRN

## 2020-08-10 MED ORDER — IPRATROPIUM-ALBUTEROL 20-100 MCG/ACT IN AERS: 1.0000 | INHALATION_SPRAY | Freq: Four times a day (QID) | RESPIRATORY_TRACT | 0 refills | Status: DC | PRN

## 2020-08-10 MED ORDER — ALTEPLASE 2 MG IJ SOLR: 2.0000 mg | Freq: Once | INTRAMUSCULAR | Status: DC | PRN

## 2020-08-10 MED ORDER — AZITHROMYCIN 500 MG PO TABS: 500.0000 mg | ORAL_TABLET | Freq: Every day | ORAL | 0 refills | Status: DC

## 2020-08-10 NOTE — Discharge Summary (Signed)
Physician Discharge Summary  James Wall JXB:[id number] DOB: [birth year] DOA: 08/08/2020  PCP: [name], MD  Admit date: 08/08/2020 Discharge date: 08/10/2020  Time spent: 35 minutes  Recommendations for Outpatient Follow-up:   CAP Early left lower lobe, POA: Presented with fever T-max 102.8, left lower lobe infiltrates on CT scan -Complete 5 to 7-day course antibiotics.   -Pulmonary toilet -DuoNeb  QID - Prednisone 50 mg daily - Flutter valve - Incentive spirometry   Intractable hiccups -7/7 Thorazine IV 25 mg x 1     ESRD on HD M/W/F -Last hemodialysis was on Wednesday. -HD per nephrology .   Essential HTN  -Amlodipine 10 mg daily - Clonidine patch 0.3 mg - Hydralazine 25 mg  BID -Isordil 20 mg  BID -Metoprolol 100 mg  BID     BPH -Flomax 0.4 mg    Hypokalemia - Potassium goal> 4 - resolved  NOTE: Patient extremely combative unhappy with the way that the ED treated him.  States the only bed they had for him was a hallway bed and he had to wait for a long period of time.  Feels that myself and RN Alma Friendly did not treat him in a respectful manner.  When I informed patient that this a.m. when RN Alma Friendly contacted me stating he wanted to leave AMA I was in the process of ensuring he was going to be on the early HD list patient states that the RN is lying.  I then inform patient I was contacted again by RN Alma Friendly that he was requesting "he go out into town and attend a funeral and return to the hospital and my answer was he would have to come back through the ED, again he states the RN is lying.  At this point I stopped the conversation and I informed patient that I had arranged for his medications to be at his bedside post HD so that he could take care of all of the issues in town he had stated to [employer] he needed to accomplish, patient's still was upset and again started to repeat how starting in the ED he was mistreated.  I terminated the conversation at this point.     Discharge Diagnoses:  Active Problems:   CAP (community acquired pneumonia)   Discharge Condition: stable  Diet recommendation: Renal Diet  Filed Weights   08/09/20 0420 08/10/20 1100  Weight: 81.6 kg 80.8 kg    History of present illness:  55 y.o.  BM PMHx Depression, Anxiety, ESRD on HD M/W/F, NICM, PSVT, pulmonary HTN, HTN, BPH, anemia, Secondary Hyperparathyroidism, OSA (not on CPAP)   Presented to [facility] ED with complaints of 3 days of productive cough and intractable hiccups.  Associated with abdominal pain.  No exacerbating or improving factors.  Work-up in the ED revealed early left lower lobe pneumonia.  Started on Rocephin in the ED.  COVID-19 screening test negative.  TRH, hospitalist team, was asked to admit.   ED Course: T-max 102.8.  BP 176/92, respiration rate 18, pulse 78.  O2 saturation 100% on room air.  Lab studies remarkable for serum sodium 133, potassium 3.1, glucose 124, BUN 18, creatinine 8.04, total bilirubin 1.3, hemoglobin 9.2, WBC 8.6, MCV 77, platelet count 201.  Hospital Course:  See above  Consultations: Nephrology  Cultures 7/6/coronavirus negative 7/6 influenza A/B negative 7/7 respiratory virus panel pending 7/7 sputum pending   Antibiotics Anti-infectives (From admission, onward)    Start     Ordered Stop   08/11/20 1000  azithromycin (ZITHROMAX) tablet 500 mg  Status:  Discontinued        08/10/20 1159 08/10/20 2035   08/11/20 1000  cefdinir (OMNICEF) capsule 300 mg  Status:  Discontinued        08/10/20 1159 08/10/20 2035   08/10/20 0000  azithromycin (ZITHROMAX) 500 MG tablet  Status:  Discontinued        08/10/20 1209 08/10/20    08/10/20 0000  cefdinir (OMNICEF) 300 MG capsule  Status:  Discontinued        08/10/20 1209 08/10/20    08/10/20 0000  azithromycin (ZITHROMAX) 500 MG tablet        08/10/20 1234     08/10/20 0000  cefdinir (OMNICEF) 300 MG capsule        08/10/20 1234     08/09/20 2200  cefTRIAXone (ROCEPHIN) 1 g  in sodium chloride 0.9 % 100 mL IVPB  Status:  Discontinued        08/09/20 0541 08/10/20 1159   08/09/20 2115  azithromycin (ZITHROMAX) 500 mg in sodium chloride 0.9 % 250 mL IVPB  Status:  Discontinued        08/09/20 2023 08/10/20 1159   08/09/20 0315  cefTRIAXone (ROCEPHIN) 1 g in sodium chloride 0.9 % 100 mL IVPB        08/09/20 0305 08/09/20 0515   08/08/20 2330  piperacillin-tazobactam (ZOSYN) IVPB 2.25 g  Status:  Discontinued        08/08/20 2329 08/09/20 0306         Discharge Exam: Vitals:   08/10/20 1100 08/10/20 1115 08/10/20 1130 08/10/20 1200  BP: (!) 177/102 (!) 163/100 (!) 167/92 (!) 177/96  Pulse: 69 68 66 68  Resp: 18     Temp: 98.7 F (37.1 C)     TempSrc: Oral     SpO2: 96%     Weight: 80.8 kg     Height:        Physical Exam:  General: No acute respiratory distress Eyes: negative scleral hemorrhage, negative anisocoria, negative icterus ENT: Negative Runny nose, negative gingival bleeding, Neck:  Negative scars, masses, torticollis, lymphadenopathy, JVD Lungs: Clear to auscultation bilaterally without wheezes or crackles Cardiovascular: Regular rate and rhythm without murmur gallop or rub normal S1 and S2 Abdomen: negative abdominal pain, nondistended, positive soft, bowel sounds, no rebound, no ascites, no appreciable mass     Discharge Instructions   Allergies as of 08/10/2020       Reactions   Venofer  [ferric Oxide] Itching   Aspirin Other (See Comments)   Made congestion worse        Medication List     STOP taking these medications    diclofenac Sodium 1 % Gel Commonly known as: Voltaren   omeprazole 40 MG capsule Commonly known as: PRILOSEC       TAKE these medications    acetaminophen 650 MG CR tablet Commonly known as: TYLENOL Take 1 tablet (650 mg total) by mouth daily as needed for pain.   amLODipine 10 MG tablet Commonly known as: NORVASC TAKE 1 TABLET(10 MG) BY MOUTH DAILY   Anoro Ellipta 62.5-25 MCG/INH  Aepb Generic drug: umeclidinium-vilanterol Inhale 1 puff into the lungs daily.   azithromycin 500 MG tablet Commonly known as: ZITHROMAX Take 1 tablet (500 mg total) by mouth daily.   cefdinir 300 MG capsule Commonly known as: OMNICEF Take 1 capsule on Saturday, 1 capsule on Monday, 1 capsule on Wednesday   chlorproMAZINE 10 MG tablet Commonly known  as: THORAZINE Take 1 tablet (10 mg total) by mouth 3 (three) times daily as needed for hiccoughs.   cloNIDine 0.3 mg/24hr patch Commonly known as: CATAPRES - Dosed in mg/24 hr Place 0.3 mg onto the skin every Sunday.   HECTOROL IV Dialysis Monday,Wednesday,friday   hydrALAZINE 25 MG tablet Commonly known as: APRESOLINE Take 25 mg by mouth 2 (two) times daily.   hydrOXYzine 25 MG tablet Commonly known as: ATARAX/VISTARIL Take 1 tablet (25 mg total) by mouth every 8 (eight) hours as needed for itching. Further refills need to come from your PCP.   Ipratropium-Albuterol 20-100 MCG/ACT Aers respimat Commonly known as: COMBIVENT Inhale 1 puff into the lungs every 6 (six) hours as needed for wheezing.   isosorbide dinitrate 20 MG tablet Commonly known as: ISORDIL TAKE 1 TABLET(20 MG) BY MOUTH TWICE DAILY What changed: See the new instructions.   metoprolol tartrate 100 MG tablet Commonly known as: LOPRESSOR Take 1 tablet by mouth twice a day.  ON DIALYSIS DAYS, TAKE AFTER DIALYSIS.   MIRCERA IJ Dialysis Monday,Wednesday,friday   pantoprazole 40 MG tablet Commonly known as: PROTONIX Take 1 tablet (40 mg total) by mouth 2 (two) times daily before a meal.   predniSONE 50 MG tablet Commonly known as: DELTASONE 1 tablet by mouth with breakfast daily   sevelamer carbonate 800 MG tablet Commonly known as: RENVELA Take 1,600-3,200 mg by mouth See admin instructions. 2-4 tabs with meals depending on meal size   tamsulosin 0.4 MG Caps capsule Commonly known as: FLOMAX Take 0.4 mg by mouth daily.       Allergies  Allergen  Reactions   Venofer  [Ferric Oxide] Itching   Aspirin Other (See Comments)    Made congestion worse      The results of significant diagnostics from this hospitalization (including imaging, microbiology, ancillary and laboratory) are listed below for reference.    Significant Diagnostic Studies: DG Chest 2 View  Result Date: 08/08/2020 CLINICAL DATA:  Cough, shortness of breath with fever and hiccups in a 55 year old male. EXAM: CHEST - 2 VIEW COMPARISON:  December 24, 2017. FINDINGS: Trachea is midline. Cardiomediastinal contours and hilar structures are normal. RIGHT-sided IJ dialysis catheter terminates in the RIGHT atrium as before. No lobar consolidation.  No sign of pleural effusion. On limited assessment no acute skeletal process. IMPRESSION: No acute cardiopulmonary disease. Electronically Signed   By: Zetta Bills M.D.   On: 08/08/2020 13:17   CT ABDOMEN PELVIS W CONTRAST  Result Date: 08/09/2020 CLINICAL DATA:  Abdominal pain and fever. Hiccups, shortness of breath, and nausea. EXAM: CT ABDOMEN AND PELVIS WITH CONTRAST TECHNIQUE: Multidetector CT imaging of the abdomen and pelvis was performed using the standard protocol following bolus administration of intravenous contrast. CONTRAST:  163m OMNIPAQUE IOHEXOL 300 MG/ML  SOLN COMPARISON:  03/08/2017 FINDINGS: Lower chest: Patchy infiltration in the left lung base, possibly early pneumonia. Left lung base air cyst. Cardiac enlargement. Hepatobiliary: No focal liver abnormality is seen. No gallstones, gallbladder wall thickening, or biliary dilatation. Pancreas: Unremarkable. No pancreatic ductal dilatation or surrounding inflammatory changes. Spleen: Normal in size without focal abnormality. Adrenals/Urinary Tract: No adrenal gland nodules. Numerous cysts in both kidneys consistent with polycystic renal disease. Scattered calcifications likely representing calcifications in the cyst walls. No hydronephrosis or hydroureter. Bladder is  decompressed. Stomach/Bowel: Stomach is within normal limits. Appendix appears normal. No evidence of bowel wall thickening, distention, or inflammatory changes. Vascular/Lymphatic: Aortic atherosclerosis. No enlarged abdominal or pelvic lymph nodes. Reproductive: Prostate gland is  enlarged. Other: No abdominal wall hernia or abnormality. No abdominopelvic ascites. 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 No focal lesions. IMPRESSION: 1. Patchy infiltration in the left lung base suggest early pneumonia. 2. Polycystic renal disease.  No hydronephrosis or hydroureter. 3. Prostate gland is enlarged. 4. Aortic atherosclerosis. 5. 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 Electronically Signed   By: Lucienne Capers M.D.   On: 08/09/2020 01:46   US Abdomen Limited RUQ (LIVER/GB)  Result Date: 08/09/2020 CLINICAL DATA:  55 year old male with history of right upper quadrant abdominal pain since Saturday. EXAM: ULTRASOUND ABDOMEN LIMITED RIGHT UPPER QUADRANT COMPARISON:  Abdominal ultrasound 12/07/2018. FINDINGS: Gallbladder: No gallstones or wall thickening visualized. No sonographic Murphy sign noted by sonographer. Common bile duct: Diameter: 2 mm Liver: No focal lesion identified. Liver has a nodular contour with prominence of the caudate lobe and coarse echotexture, indicative of underlying cirrhosis. Portal vein is patent on color Doppler imaging with normal direction of blood flow towards the liver. Other: Atrophic partially imaged right kidney with multiple small cysts. IMPRESSION: 1. Negative for cholelithiasis or findings to suggest acute cholecystitis. 2. Cirrhosis. Electronically Signed   By: Vinnie Langton M.D.   On: 08/09/2020 06:16    Microbiology: Recent Results (from the past 240 hour(s))  Resp Panel by RT-PCR (Flu A&B, Covid) Nasopharyngeal Swab     Status: None   Collection Time: 08/08/20 12:34 PM   Specimen: Nasopharyngeal Swab;  Nasopharyngeal(NP) swabs in vial transport medium  Result Value Ref Range Status   SARS Coronavirus 2 by RT PCR NEGATIVE NEGATIVE Final    Comment: (NOTE) SARS-CoV-2 target nucleic acids are NOT DETECTED.  The SARS-CoV-2 RNA is generally detectable in upper respiratory specimens during the acute phase of infection. The lowest concentration of SARS-CoV-2 viral copies this assay can detect is 138 copies/mL. A negative result does not preclude SARS-Cov-2 infection and should not be used as the sole basis for treatment or other patient management decisions. A negative result may occur with  improper specimen collection/handling, submission of specimen other than nasopharyngeal swab, presence of viral mutation(s) within the areas targeted by this assay, and inadequate number of viral copies(<138 copies/mL). A negative result must be combined with clinical observations, patient history, and epidemiological information. The expected result is Negative.  Fact Sheet for Patients:  EntrepreneurPulse.com.au  Fact Sheet for Healthcare Providers:  IncredibleEmployment.be  This test is no t yet approved or cleared by the [place] FDA and  has been authorized for detection and/or diagnosis of SARS-CoV-2 by FDA under an Emergency Use Authorization (EUA). This EUA will remain  in effect (meaning this test can be used) for the duration of the COVID-19 declaration under Section 564(b)(1) of the Act, 21 U.S.C.section 360bbb-3(b)(1), unless the authorization is terminated  or revoked sooner.       Influenza A by PCR NEGATIVE NEGATIVE Final   Influenza B by PCR NEGATIVE NEGATIVE Final    Comment: (NOTE) The Xpert Xpress SARS-CoV-2/FLU/RSV plus assay is intended as an aid in the diagnosis of influenza from Nasopharyngeal swab specimens and should not be used as a sole basis for treatment. Nasal washings and aspirates are unacceptable for Xpert Xpress  SARS-CoV-2/FLU/RSV testing.  Fact Sheet for Patients: EntrepreneurPulse.com.au  Fact Sheet for Healthcare Providers: IncredibleEmployment.be  This test is not yet approved or cleared by the [place] FDA and has been authorized for detection and/or diagnosis of SARS-CoV-2 by FDA under an Emergency Use Authorization (EUA). This EUA will remain  in effect (meaning this test can be used) for the duration of the COVID-19 declaration under Section 564(b)(1) of the Act, 21 U.S.C. section 360bbb-3(b)(1), unless the authorization is terminated or revoked.  Performed at [facility], [place] [address], [place], [place] [address]   Blood culture (routine x 2)     Status: None (Preliminary result)   Collection Time: 08/09/20  2:00 AM   Specimen: BLOOD LEFT HAND  Result Value Ref Range Status   Specimen Description BLOOD LEFT HAND  Final   Special Requests   Final    BOTTLES DRAWN AEROBIC AND ANAEROBIC Blood Culture adequate volume   Culture   Final    NO GROWTH 1 DAY Performed at [facility], [place] [address], [place], [place] [address]    Report Status PENDING  Incomplete  Blood culture (routine x 2)     Status: None (Preliminary result)   Collection Time: 08/09/20  2:16 AM   Specimen: BLOOD  Result Value Ref Range Status   Specimen Description BLOOD RIGHT ARM  Final   Special Requests   Final    BOTTLES DRAWN AEROBIC AND ANAEROBIC Blood Culture results may not be optimal due to an inadequate volume of blood received in culture bottles   Culture   Final    NO GROWTH 1 DAY Performed at [facility], [place] [address], [place], [place] [address]    Report Status PENDING  Incomplete     Labs: Basic Metabolic Panel: Recent Labs  Lab 08/08/20 1226 08/09/20 0537 08/10/20 1104  NA 133* 133* 133*  K 3.1* 3.6 4.6  CL 90* 91* 92*  CO2 30 29 28   GLUCOSE 124* 113* 146*  BUN 18 30* 49*  CREATININE 8.04* 10.46* 12.54*  CALCIUM 9.4  9.4 10.9*  MG  --  1.8 2.1  PHOS  --  3.3 3.8   Liver Function Tests: Recent Labs  Lab 08/08/20 1226 08/09/20 0537 08/10/20 1104  AST 16  --   --   ALT 9  --   --   ALKPHOS 45  --   --   BILITOT 1.3*  --   --   PROT 7.8  --   --   ALBUMIN 3.0* 2.6* 2.8*   Recent Labs  Lab 08/08/20 1226  LIPASE 22   No results for input(s): AMMONIA in the last 168 hours. CBC: Recent Labs  Lab 08/08/20 1226 08/09/20 0900 08/10/20 1104  WBC 8.6 9.2 12.8*  NEUTROABS 7.0 7.1 11.1*  HGB 9.2* 8.8* 8.2*  HCT 29.4* 29.2* 27.1*  MCV 77.0* 78.3* 77.9*  PLT 201 209 268   Cardiac Enzymes: No results for input(s): CKTOTAL, CKMB, CKMBINDEX, TROPONINI in the last 168 hours. BNP: BNP (last 3 results) No results for input(s): BNP in the last 8760 hours.  ProBNP (last 3 results) No results for input(s): PROBNP in the last 8760 hours.  CBG: No results for input(s): GLUCAP in the last 168 hours.     Signed:  Dia Crawford, MD [facility]

## 2020-08-10 NOTE — Progress Notes (Signed)
Discharge instructions (including medications) discussed with and copy provided to patient/caregiver 

## 2020-08-10 NOTE — Consult Note (Addendum)
[facility] Renal Consultation Note    Indication for Consultation:  Management of ESRD/hemodialysis, anemia, hypertension/volume, and secondary hyperparathyroidism. PCP:  HPI: James Wall is a 55 y.o. male with ESRD, HTN, HL, COPD, cirrhosis, NICM (EF 40-45%) who was admitted OBS status with hiccups and pneumonia.  Presented to ED after HD on Wednesday with intractable hiccups, nausea, and epigastric pain x 4-5 days. Vitals ok except HTN, was afebrile initially then up to 102.56F. Labs showed Na 133, K 3.6, WBC 9.2, Hgb 8.8. Initial CXR clear. He underwent abdominal US and CT abdomen which did not show gallbladder issues, noted to have LLL pneumonia and cirrhosis. HIV/COVID negative. Blood Cx drawn and he was started on Ceftriaxone and Azithromycin for pneumonia and thorazine for the hiccups.  Seen in room today. Dressed in street clothes and look comfortable. Denies CP or dyspnea at this time. Still having hiccups.  Dialyzes on MWF schedule at [facility] - due for HD today. Last HD was Monday 7/4 (missed 7/6 treatment). Uses TDC as permanent access.  Past Medical History:  Diagnosis Date   Anemia    Anxiety    Arthritis    GOUT - pt not sure if this is true- not sure 11-30-18   AV fistula occlusion ([place]) 10/2015   Blood transfusion without reported diagnosis    Cirrhosis, nonalcoholic ([place])    COPD (chronic obstructive pulmonary disease) ([place])    Depression    ESRD on hemodialysis ([place])    Started HD Jan 2009.  ESRD was due to HTN.  Dx'd with HTN in hospital 1996 according to pt, they had to keep him so he could get Medicaid to afford the BP medications.  First saw a nephrologist and started HD in the same year 2009.  Gets HD at [facility] on a MWF schedule.  Does not have DM. He had a left RC AVF that never functioned, a left upper arm AVF that worked for about 5 years and as of June   GERD (gastroesophageal reflux disease)    Heart murmur    as a child per pt    Hyperlipidemia    Hypertension    NICM (nonischemic cardiomyopathy) ([place])    PSVT (paroxysmal supraventricular tachycardia) ([place])    Pulmonary hypertension ([place])    PVC's (premature ventricular contractions)    Secondary hyperparathyroidism ([place])    Sepsis ([place]) 02/2013   from AVF , treated with Vancomycin.   Sleep apnea    no  longer using cpap   Past Surgical History:  Procedure Laterality Date   AV FISTULA PLACEMENT  2009   Left lower arm AVF   AV FISTULA PLACEMENT Right 02/22/2013   Procedure:  CREATION  OF BRACHIAL CEPHALIC FISTULA RIGHT ARM;  Surgeon: Elam Dutch, MD;  [facility] OR;  Service: Vascular;  Laterality: Right;   AV FISTULA PLACEMENT Left 08/10/2014   Procedure: BASILIC VEIN TRANSPOSITION  ARTERIOVENOUS (AV) FISTULA CREATION LEFT UPPER ARM;  Surgeon: Mal Misty, MD;  Location: [facility] OR;  Service: Vascular;  Laterality: Left;   COLONOSCOPY     ESOPHAGOGASTRODUODENOSCOPY (EGD) WITH PROPOFOL N/A 04/12/2013   Procedure: ESOPHAGOGASTRODUODENOSCOPY (EGD) WITH PROPOFOL;  Surgeon: Arta Silence, MD;  Location: [facility] ENDOSCOPY;  Service: Endoscopy;  Laterality: N/A;   INSERTION OF DIALYSIS CATHETER N/A 12/23/2012   Procedure: INSERTION OF DIALYSIS CATHETER; ULTRASOUND GUIDED;  Surgeon: Angelia Mould, MD;  Location: [place];  Service: Vascular;  Laterality: N/A;   INSERTION OF DIALYSIS CATHETER  10/22/2015  Right IJ non-tunneled HD catheter, placed again in 2019   IR FLUORO GUIDE CV LINE RIGHT  08/18/2019   IR FLUORO GUIDE CV LINE RIGHT  10/07/2019   IR GENERIC HISTORICAL  10/22/2015   IR US GUIDE VASC ACCESS RIGHT 10/22/2015 [facility]   IR GENERIC HISTORICAL  10/22/2015   IR FLUORO GUIDE CV LINE RIGHT 10/22/2015 [facility]   IR GENERIC HISTORICAL  10/23/2015   IR FLUORO GUIDE CV LINE RIGHT 10/23/2015 Marybelle Killings, MD [facility]   LEFT HEART CATHETERIZATION WITH CORONARY ANGIOGRAM N/A 07/13/2013   Procedure: LEFT HEART CATHETERIZATION WITH CORONARY ANGIOGRAM;   Surgeon: Jettie Booze, MD;  Location: [facility] CATH LAB;  Service: Cardiovascular;  Laterality: N/A;   LIGATION OF ARTERIOVENOUS  FISTULA Left 12/22/2012   Procedure: LIGATION OF ARTERIOVENOUS  FISTULA;EXCISION OF LARGE ANEURYSMS;;  Surgeon: Elam Dutch, MD;  Location: [facility] OR;  Service: Vascular;  Laterality: Left;   UPPER GASTROINTESTINAL ENDOSCOPY     Family History  Problem Relation Age of Onset   Hypertension Mother    Cerebrovascular Accident Father    Hypertension Father    Congestive Heart Failure Brother    Asthma Brother    Stomach cancer Neg Hx    Rectal cancer Neg Hx    Esophageal cancer Neg Hx    Colon cancer Neg Hx    Social History:  reports that he quit smoking about 13 months ago. His smoking use included cigarettes. He has a 8.00 pack-year smoking history. He has never used smokeless tobacco. He reports current alcohol use of about 2.0 standard drinks of alcohol per week. He reports current drug use. Frequency: 2.00 times per week. Drug: Marijuana.  ROS: As per HPI otherwise negative.  Physical Exam: Vitals:   08/09/20 1241 08/09/20 1503 08/09/20 2120 08/10/20 0554  BP: (!) 154/86 (!) 150/92 (!) 179/104 (!) 176/89  Pulse: 68 86 83 69  Resp: 19 16 18 18   Temp: 98.6 F (37 C) 98.6 F (37 C) 98.5 F (36.9 C) 98.3 F (36.8 C)  TempSrc:      SpO2: 100% 100% 100% 99%  Weight:      Height:         General: Well developed, well nourished, in no acute distress. Head: Normocephalic, atraumatic, sclera non-icteric, mucus membranes are moist. Neck: Supple without lymphadenopathy/masses. JVD not elevated. Lungs: Clear bilaterally to auscultation without wheezes, rales, or rhonchi. Breathing is unlabored. Heart: RRR with normal S1, S2. No murmurs, rubs, or gallops appreciated. Abdomen: Soft, non-tender, non-distended with normoactive bowel sounds. Musculoskeletal:  Strength and tone appear normal for age. Lower extremities: No edema or ischemic changes, no open  wounds. Neuro: Alert and oriented X 3. Moves all extremities spontaneously. Psych:  Responds to questions appropriately with a normal affect. Dialysis Access: TDC in R chest  Allergies  Allergen Reactions   Venofer  [Ferric Oxide] Itching   Aspirin Other (See Comments)    Made congestion worse   Prior to Admission medications   Medication Sig Start Date End Date Taking? Authorizing Provider  acetaminophen (TYLENOL) 650 MG CR tablet Take 1 tablet (650 mg total) by mouth daily as needed for pain. 03/17/17  Yes Mariel Aloe, MD  amLODipine (NORVASC) 10 MG tablet TAKE 1 TABLET(10 MG) BY MOUTH DAILY 04/26/18  Yes Allred, Jeneen Rinks, MD  cloNIDine (CATAPRES - DOSED IN MG/24 HR) 0.3 mg/24hr patch Place 0.3 mg onto the skin every Sunday. 12/29/18  Yes [provider]  Doxercalciferol (HECTOROL IV) Dialysis  Monday,Wednesday,friday 05/30/20 06/24/21 Yes [provider]  hydrALAZINE (APRESOLINE) 25 MG tablet Take 25 mg by mouth 2 (two) times daily.   Yes [provider]  hydrOXYzine (ATARAX/VISTARIL) 25 MG tablet Take 1 tablet (25 mg total) by mouth every 8 (eight) hours as needed for itching. Further refills need to come from your PCP. 04/23/17  Yes Dorothy Spark, MD  isosorbide dinitrate (ISORDIL) 20 MG tablet TAKE 1 TABLET(20 MG) BY MOUTH TWICE DAILY Patient taking differently: Take 20 mg by mouth 2 (two) times daily. 08/31/19  Yes Dorothy Spark, MD  Methoxy PEG-Epoetin Beta (MIRCERA IJ) Dialysis Monday,Wednesday,friday 08/24/19 07/31/21 Yes [provider]  metoprolol tartrate (LOPRESSOR) 100 MG tablet Take 1 tablet by mouth twice a day.  ON DIALYSIS DAYS, TAKE AFTER DIALYSIS. 02/17/20  Yes Dorothy Spark, MD  omeprazole (PRILOSEC) 40 MG capsule Take 40 mg by mouth in the morning and at bedtime. 07/09/20  Yes [provider]  pantoprazole (PROTONIX) 40 MG tablet Take 1 tablet (40 mg total) by mouth 2 (two) times daily before a meal. 03/10/19  Yes Thornton Park, MD  sevelamer carbonate (RENVELA) 800 MG tablet Take 1,600-3,200 mg by mouth See admin instructions. 2-4 tabs with meals depending on meal size   Yes [provider]  tamsulosin (FLOMAX) 0.4 MG CAPS capsule Take 0.4 mg by mouth daily. 08/01/14  Yes [provider]  umeclidinium-vilanterol (ANORO ELLIPTA) 62.5-25 MCG/INH AEPB Inhale 1 puff into the lungs daily. 09/20/19  Yes Autry-Lott, Naaman Plummer, DO  diclofenac Sodium (VOLTAREN) 1 % GEL Apply 2 g topically 4 (four) times daily. Patient not taking: Reported on 08/09/2020 06/05/20   Autry-Lott, Naaman Plummer, DO   Current Facility-Administered Medications  Medication Dose Route Frequency Provider Last Rate Last Admin   acetaminophen (TYLENOL) tablet 650 mg  650 mg Oral Once Loni Beckwith, PA-C       amLODipine (NORVASC) tablet 10 mg  10 mg Oral Daily Irene Pap N, DO   10 mg at 08/10/20 1572   azithromycin (ZITHROMAX) 500 mg in sodium chloride 0.9 % 250 mL IVPB  500 mg Intravenous Q24H Allie Bossier, MD 250 mL/hr at 08/09/20 2332 500 mg at 08/09/20 2332   cefTRIAXone (ROCEPHIN) 1 g in sodium chloride 0.9 % 100 mL IVPB  1 g Intravenous Q24H Irene Pap N, DO 200 mL/hr at 08/10/20 0406 1 g at 08/10/20 0406   Chlorhexidine Gluconate Cloth 2 % PADS 6 each  6 each Topical Q0600 Loren Racer, PA-C   6 each at 08/10/20 6203   chlorproMAZINE (THORAZINE) tablet 10 mg  10 mg Oral TID PRN Kayleen Memos, DO       [START ON 08/12/2020] cloNIDine (CATAPRES - Dosed in mg/24 hr) patch 0.3 mg  0.3 mg Transdermal Q Sun Hall, Carole N, DO       heparin injection 5,000 Units  5,000 Units Subcutaneous Q8H Irene Pap N, DO   5,000 Units at 08/09/20 2203   hydrALAZINE (APRESOLINE) tablet 25 mg  25 mg Oral BID Irene Pap N, DO   25 mg at 08/10/20 5597   ipratropium-albuterol (DUONEB) 0.5-2.5 (3) MG/3ML nebulizer solution 3 mL  3 mL Nebulization Q6H Hall, Carole N, DO   3 mL at 08/09/20 1106   isosorbide dinitrate (ISORDIL) tablet 20 mg  20  mg Oral BID Irene Pap N, DO   20 mg at 08/10/20 4163   methylPREDNISolone sodium succinate (SOLU-MEDROL) 125 mg/2 mL injection 60 mg  60 mg  Intravenous Q12H Allie Bossier, MD   60 mg at 08/10/20 8676   metoprolol tartrate (LOPRESSOR) tablet 100 mg  100 mg Oral BID Kayleen Memos, DO   100 mg at 08/10/20 1950   pantoprazole (PROTONIX) EC tablet 40 mg  40 mg Oral BID AC Hall, Carole N, DO   40 mg at 08/10/20 9326   sevelamer carbonate (RENVELA) tablet 1,600-3,200 mg  1,600-3,200 mg Oral TID WC Irene Pap N, DO   1,600 mg at 08/10/20 7124   tamsulosin (FLOMAX) capsule 0.4 mg  0.4 mg Oral Daily Irene Pap N, DO   0.4 mg at 08/10/20 5809   umeclidinium-vilanterol (ANORO ELLIPTA) 62.5-25 MCG/INH 1 puff  1 puff Inhalation Daily Irene Pap N, DO   1 puff at 08/10/20 0926   Labs: Basic Metabolic Panel: Recent Labs  Lab 08/08/20 1226 08/09/20 0537  NA 133* 133*  K 3.1* 3.6  CL 90* 91*  CO2 30 29  GLUCOSE 124* 113*  BUN 18 30*  CREATININE 8.04* 10.46*  CALCIUM 9.4 9.4  PHOS  --  3.3   Liver Function Tests: Recent Labs  Lab 08/08/20 1226 08/09/20 0537  AST 16  --   ALT 9  --   ALKPHOS 45  --   BILITOT 1.3*  --   PROT 7.8  --   ALBUMIN 3.0* 2.6*   Recent Labs  Lab 08/08/20 1226  LIPASE 22   CBC: Recent Labs  Lab 08/08/20 1226 08/09/20 0900  WBC 8.6 9.2  NEUTROABS 7.0 7.1  HGB 9.2* 8.8*  HCT 29.4* 29.2*  MCV 77.0* 78.3*  PLT 201 209   Studies/Results: DG Chest 2 View  Result Date: 08/08/2020 CLINICAL DATA:  Cough, shortness of breath with fever and hiccups in a 55 year old male. EXAM: CHEST - 2 VIEW COMPARISON:  December 24, 2017. FINDINGS: Trachea is midline. Cardiomediastinal contours and hilar structures are normal. RIGHT-sided IJ dialysis catheter terminates in the RIGHT atrium as before. No lobar consolidation.  No sign of pleural effusion. On limited assessment no acute skeletal process. IMPRESSION: No acute cardiopulmonary disease. Electronically Signed    By: Zetta Bills M.D.   On: 08/08/2020 13:17   CT ABDOMEN PELVIS W CONTRAST  Result Date: 08/09/2020 CLINICAL DATA:  Abdominal pain and fever. Hiccups, shortness of breath, and nausea. EXAM: CT ABDOMEN AND PELVIS WITH CONTRAST TECHNIQUE: Multidetector CT imaging of the abdomen and pelvis was performed using the standard protocol following bolus administration of intravenous contrast. CONTRAST:  115m OMNIPAQUE IOHEXOL 300 MG/ML  SOLN COMPARISON:  03/08/2017 FINDINGS: Lower chest: Patchy infiltration in the left lung base, possibly early pneumonia. Left lung base air cyst. Cardiac enlargement. Hepatobiliary: No focal liver abnormality is seen. No gallstones, gallbladder wall thickening, or biliary dilatation. Pancreas: Unremarkable. No pancreatic ductal dilatation or surrounding inflammatory changes. Spleen: Normal in size without focal abnormality. Adrenals/Urinary Tract: No adrenal gland nodules. Numerous cysts in both kidneys consistent with polycystic renal disease. Scattered calcifications likely representing calcifications in the cyst walls. No hydronephrosis or hydroureter. Bladder is decompressed. Stomach/Bowel: Stomach is within normal limits. Appendix appears normal. No evidence of bowel wall thickening, distention, or inflammatory changes. Vascular/Lymphatic: Aortic atherosclerosis. No enlarged abdominal or pelvic lymph nodes. Reproductive: Prostate gland is enlarged. Other: No abdominal wall hernia or abnormality. No abdominopelvic ascites. 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 No focal lesions. IMPRESSION: 1. Patchy infiltration in the left lung base suggest early pneumonia. 2. Polycystic renal disease.  No hydronephrosis or hydroureter. 3.  Prostate gland is enlarged. 4. Aortic atherosclerosis. 5. 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 Electronically Signed   By: Lucienne Capers M.D.   On: 08/09/2020 01:46   US Abdomen Limited  RUQ (LIVER/GB)  Result Date: 08/09/2020 CLINICAL DATA:  55 year old male with history of right upper quadrant abdominal pain since Saturday. EXAM: ULTRASOUND ABDOMEN LIMITED RIGHT UPPER QUADRANT COMPARISON:  Abdominal ultrasound 12/07/2018. FINDINGS: Gallbladder: No gallstones or wall thickening visualized. No sonographic Murphy sign noted by sonographer. Common bile duct: Diameter: 2 mm Liver: No focal lesion identified. Liver has a nodular contour with prominence of the caudate lobe and coarse echotexture, indicative of underlying cirrhosis. Portal vein is patent on color Doppler imaging with normal direction of blood flow towards the liver. Other: Atrophic partially imaged right kidney with multiple small cysts. IMPRESSION: 1. Negative for cholelithiasis or findings to suggest acute cholecystitis. 2. Cirrhosis. Electronically Signed   By: Vinnie Langton M.D.   On: 08/09/2020 06:16    Dialysis Orders:  MWF [facility] 4hr, 400/A1.5, EDW 81.5kg, 2K/2Ca, UFP #2, TDC, heparin 4000 + 1000 mid-run bolus - Mircera 230mg IV q 2 weeks (last 6/29) - Hectoral 743m Iv q HD  Assessment/Plan:  Intractable hiccups: On thorazine, likely diaphragm irritation from pneumonia.  LLL pneumonia: Bcx pending. On ceftriaxone/azithromycin.  ESRD:  Continue HD per usual MWF schedule - HD today.  Hypertension/volume: BP high, UF as tolerated - continue home meds.  Anemia: Hgb 8.8 - not due for ESA yet.  Metabolic bone disease: Ca/Phos good - continue home meds.    KaVeneta PentonPA-C 08/10/2020, 9:38 AM  [facility]  Seen and examined independently.  Agree with note and exam as documented above by physician extender and as noted here.  He has questions about his liver and I asked that he make sure to discuss with primary team. May go home today -he's not sure.    General adult male in bed in no acute distress HEENT normocephalic atraumatic extraocular movements intact sclera anicteric Neck supple trachea  midline Lungs clear to auscultation bilaterally normal work of breathing at rest; on room air Heart S1S2 no rub Abdomen soft nontender nondistended Extremities no edema  Psych normal mood and affect  LLL PNA - on ceftriazone/azithro per primary team   ESRD on HD - MWF schedule - HD today   Intractable hiccups - has been on thorazine per primary team; note given his cirrhosis would discontinue same   HTN - continue home regimen and optimize volume with HD  Anemia CKD - not yet due for ESA   Metabolic bone disease - continue renvela   LoClaudia DesanctisMD 08/10/2020 10:36 AM

## 2020-08-10 NOTE — Plan of Care (Signed)
  Problem: Education: Goal: Knowledge of General Education information will improve Description: Including pain rating scale, medication(s)/side effects and non-pharmacologic comfort measures Outcome: Adequate for Discharge   

## 2020-08-11 NOTE — TOC Transition Note (Signed)
Transition of care contact from inpatient facility  Date of discharge: 08/10/20 Date of contact: 08/11/20 Method: Phone Spoke to: Patient  Patient contacted to discuss transition of care from recent inpatient hospitalization. Patient was admitted to [facility] from 08/09/20-08/10/20 with discharge diagnosis of Left Lower Lobe Pneumonia.  Medication changes were reviewed. Patient reports that he just picked up his medications from the drug store and denies needing any re-fills at this time.  Patient will follow up with his/her outpatient HD unit on: Monday 08/13/20 at [facility].  Tobie Poet, NP

## 2020-08-13 DIAGNOSIS — N2581 Secondary hyperparathyroidism of renal origin: Secondary | ICD-10-CM | POA: Diagnosis not present

## 2020-08-13 DIAGNOSIS — D509 Iron deficiency anemia, unspecified: Secondary | ICD-10-CM | POA: Diagnosis not present

## 2020-08-13 DIAGNOSIS — D631 Anemia in chronic kidney disease: Secondary | ICD-10-CM | POA: Diagnosis not present

## 2020-08-13 DIAGNOSIS — N186 End stage renal disease: Secondary | ICD-10-CM | POA: Diagnosis not present

## 2020-08-13 DIAGNOSIS — J158 Pneumonia due to other specified bacteria: Secondary | ICD-10-CM | POA: Diagnosis not present

## 2020-08-13 DIAGNOSIS — Z992 Dependence on renal dialysis: Secondary | ICD-10-CM | POA: Diagnosis not present

## 2020-08-14 LAB — CULTURE, BLOOD (ROUTINE X 2)
Culture: NO GROWTH
Culture: NO GROWTH
Special Requests: ADEQUATE

## 2020-08-15 DIAGNOSIS — D509 Iron deficiency anemia, unspecified: Secondary | ICD-10-CM | POA: Diagnosis not present

## 2020-08-15 DIAGNOSIS — N2581 Secondary hyperparathyroidism of renal origin: Secondary | ICD-10-CM | POA: Diagnosis not present

## 2020-08-15 DIAGNOSIS — N186 End stage renal disease: Secondary | ICD-10-CM | POA: Diagnosis not present

## 2020-08-15 DIAGNOSIS — Z992 Dependence on renal dialysis: Secondary | ICD-10-CM | POA: Diagnosis not present

## 2020-08-15 DIAGNOSIS — D631 Anemia in chronic kidney disease: Secondary | ICD-10-CM | POA: Diagnosis not present

## 2020-08-15 DIAGNOSIS — J158 Pneumonia due to other specified bacteria: Secondary | ICD-10-CM | POA: Diagnosis not present

## 2020-08-20 DIAGNOSIS — N186 End stage renal disease: Secondary | ICD-10-CM | POA: Diagnosis not present

## 2020-08-20 DIAGNOSIS — D509 Iron deficiency anemia, unspecified: Secondary | ICD-10-CM | POA: Diagnosis not present

## 2020-08-20 DIAGNOSIS — J158 Pneumonia due to other specified bacteria: Secondary | ICD-10-CM | POA: Diagnosis not present

## 2020-08-20 DIAGNOSIS — D631 Anemia in chronic kidney disease: Secondary | ICD-10-CM | POA: Diagnosis not present

## 2020-08-20 DIAGNOSIS — N2581 Secondary hyperparathyroidism of renal origin: Secondary | ICD-10-CM | POA: Diagnosis not present

## 2020-08-20 DIAGNOSIS — Z992 Dependence on renal dialysis: Secondary | ICD-10-CM | POA: Diagnosis not present

## 2020-08-22 DIAGNOSIS — D631 Anemia in chronic kidney disease: Secondary | ICD-10-CM | POA: Diagnosis not present

## 2020-08-22 DIAGNOSIS — N186 End stage renal disease: Secondary | ICD-10-CM | POA: Diagnosis not present

## 2020-08-22 DIAGNOSIS — J158 Pneumonia due to other specified bacteria: Secondary | ICD-10-CM | POA: Diagnosis not present

## 2020-08-22 DIAGNOSIS — Z992 Dependence on renal dialysis: Secondary | ICD-10-CM | POA: Diagnosis not present

## 2020-08-22 DIAGNOSIS — N2581 Secondary hyperparathyroidism of renal origin: Secondary | ICD-10-CM | POA: Diagnosis not present

## 2020-08-22 DIAGNOSIS — D509 Iron deficiency anemia, unspecified: Secondary | ICD-10-CM | POA: Diagnosis not present

## 2020-08-24 DIAGNOSIS — D631 Anemia in chronic kidney disease: Secondary | ICD-10-CM | POA: Diagnosis not present

## 2020-08-24 DIAGNOSIS — N186 End stage renal disease: Secondary | ICD-10-CM | POA: Diagnosis not present

## 2020-08-24 DIAGNOSIS — D509 Iron deficiency anemia, unspecified: Secondary | ICD-10-CM | POA: Diagnosis not present

## 2020-08-24 DIAGNOSIS — J158 Pneumonia due to other specified bacteria: Secondary | ICD-10-CM | POA: Diagnosis not present

## 2020-08-24 DIAGNOSIS — N2581 Secondary hyperparathyroidism of renal origin: Secondary | ICD-10-CM | POA: Diagnosis not present

## 2020-08-24 DIAGNOSIS — Z992 Dependence on renal dialysis: Secondary | ICD-10-CM | POA: Diagnosis not present

## 2020-08-27 ENCOUNTER — Telehealth: Payer: Self-pay | Admitting: Internal Medicine

## 2020-08-27 DIAGNOSIS — N186 End stage renal disease: Secondary | ICD-10-CM | POA: Diagnosis not present

## 2020-08-27 DIAGNOSIS — D631 Anemia in chronic kidney disease: Secondary | ICD-10-CM | POA: Diagnosis not present

## 2020-08-27 DIAGNOSIS — J158 Pneumonia due to other specified bacteria: Secondary | ICD-10-CM | POA: Diagnosis not present

## 2020-08-27 DIAGNOSIS — Z992 Dependence on renal dialysis: Secondary | ICD-10-CM | POA: Diagnosis not present

## 2020-08-27 DIAGNOSIS — D509 Iron deficiency anemia, unspecified: Secondary | ICD-10-CM | POA: Diagnosis not present

## 2020-08-27 DIAGNOSIS — N2581 Secondary hyperparathyroidism of renal origin: Secondary | ICD-10-CM | POA: Diagnosis not present

## 2020-08-27 NOTE — Telephone Encounter (Signed)
Previous patient of Dr. Meda Coffee scheduled for 08/28/20 at 1:20 with Dr. Gasper Sells

## 2020-08-28 ENCOUNTER — Ambulatory Visit (INDEPENDENT_AMBULATORY_CARE_PROVIDER_SITE_OTHER): Payer: Medicare Other | Admitting: Internal Medicine

## 2020-08-28 ENCOUNTER — Other Ambulatory Visit: Payer: Self-pay

## 2020-08-28 ENCOUNTER — Encounter: Payer: Self-pay | Admitting: Internal Medicine

## 2020-08-28 VITALS — BP 160/98 | HR 79 | Ht 65.0 in | Wt 178.0 lb

## 2020-08-28 DIAGNOSIS — Z720 Tobacco use: Secondary | ICD-10-CM

## 2020-08-28 DIAGNOSIS — I5020 Unspecified systolic (congestive) heart failure: Secondary | ICD-10-CM | POA: Insufficient documentation

## 2020-08-28 DIAGNOSIS — J449 Chronic obstructive pulmonary disease, unspecified: Secondary | ICD-10-CM | POA: Diagnosis not present

## 2020-08-28 DIAGNOSIS — I10 Essential (primary) hypertension: Secondary | ICD-10-CM

## 2020-08-28 NOTE — Patient Instructions (Signed)
Medication Instructions:  Your physician recommends that you continue on your current medications as directed. Please refer to the Current Medication list given to you today. PLEASE BE SURE YOU TAKE MEDICATIONS AS ORDERED   *If you need a refill on your cardiac medications before your next appointment, please call your pharmacy*   Lab Work: NONE If you have labs (blood work) drawn today and your tests are completely normal, you will receive your results only by: [place] (if you have MyChart) OR A paper copy in the mail If you have any lab test that is abnormal or we need to change your treatment, we will call you to review the results.   Testing/Procedures: Your physician has requested that you have an echocardiogram. Echocardiography is a painless test that uses sound waves to create images of your heart. It provides your doctor with information about the size and shape of your heart and how well your heart's chambers and valves are working. This procedure takes approximately one hour. There are no restrictions for this procedure.  Follow-Up: At [facility], you and your health needs are our priority.  As part of our continuing mission to provide you with exceptional heart care, we have created designated Provider Care Teams.  These Care Teams include your primary Cardiologist (physician) and Advanced Practice Providers (APPs -  Physician Assistants and Nurse Practitioners) who all work together to provide you with the care you need, when you need it.  We recommend signing up for the patient portal called "MyChart".  Sign up information is provided on this After Visit Summary.  MyChart is used to connect with patients for Virtual Visits (Telemedicine).  Patients are able to view lab/test results, encounter notes, upcoming appointments, etc.  Non-urgent messages can be sent to your provider as well.   To learn more about what you can do with MyChart, go to [email or web address].     Your next appointment:   2 month(s)  The format for your next appointment:   In Person  Provider:   You may see Werner Lean, MD or one of the following Advanced Practice Providers on your designated Care Team:   Melina Copa, PA-C Ermalinda Barrios, PA-C

## 2020-08-28 NOTE — Progress Notes (Signed)
Cardiology Office Note:    Date:  08/28/2020   ID:  James, Wall December 27, 1965, MRN [id number]  PCP:  [name], MD   [facility] Providers Cardiologist:  Werner Lean, MD     Referring MD: [name], MD   [id number] from Dr. Meda Coffee  History of Present Illness:    James Wall is a 55 y.o. male with a hx of COPD and active tobacco abuse, ESRD via HD cath, HTN, HLD, PH NOS, history of Cirrhosis, HFmrEF who presents for evaluation 08/28/20.  Patient notes that he is doing poorly for the last couple of days.  With activity feels as if his heart is beating quickly.  Feels sluggish after his blood pressure medication and thinks his isordil may be the culprit.  Blood pressure at home does not checked.  Had recent PNA evaluation 08/08/20.    No chest pain or pressure.  Does note DOE that has not completely resolved with antibiotics.  No weight gain or leg swelling.  No palpitations or syncope outside of activity or after HD.  Had prior LHC in 2015 with femoral hematoma.  Ambulatory blood pressure not done.   Past Medical History:  Diagnosis Date   Anemia    Anxiety    Arthritis    GOUT - pt not sure if this is true- not sure 11-30-18   AV fistula occlusion ([place]) 10/2015   Blood transfusion without reported diagnosis    Cirrhosis, nonalcoholic ([place])    COPD (chronic obstructive pulmonary disease) ([place])    Depression    ESRD on hemodialysis ([place])    Started HD Jan 2009.  ESRD was due to HTN.  Dx'd with HTN in hospital 1996 according to pt, they had to keep him so he could get Medicaid to afford the BP medications.  First saw a nephrologist and started HD in the same year 2009.  Gets HD at [facility] on a MWF schedule.  Does not have DM. He had a left RC AVF that never functioned, a left upper arm AVF that worked for about 5 years and as of June   GERD (gastroesophageal reflux disease)    Heart murmur    as a child per pt   Hyperlipidemia     Hypertension    NICM (nonischemic cardiomyopathy) ([place])    PSVT (paroxysmal supraventricular tachycardia) ([place])    Pulmonary hypertension ([place])    PVC's (premature ventricular contractions)    Secondary hyperparathyroidism ([place])    Sepsis ([place]) 02/2013   from AVF , treated with Vancomycin.   Sleep apnea    no  longer using cpap    Past Surgical History:  Procedure Laterality Date   AV FISTULA PLACEMENT  2009   Left lower arm AVF   AV FISTULA PLACEMENT Right 02/22/2013   Procedure:  CREATION  OF BRACHIAL CEPHALIC FISTULA RIGHT ARM;  Surgeon: Elam Dutch, MD;  Location: [facility] OR;  Service: Vascular;  Laterality: Right;   AV FISTULA PLACEMENT Left 08/10/2014   Procedure: BASILIC VEIN TRANSPOSITION  ARTERIOVENOUS (AV) FISTULA CREATION LEFT UPPER ARM;  Surgeon: Mal Misty, MD;  Location: [facility] OR;  Service: Vascular;  Laterality: Left;   COLONOSCOPY     ESOPHAGOGASTRODUODENOSCOPY (EGD) WITH PROPOFOL N/A 04/12/2013   Procedure: ESOPHAGOGASTRODUODENOSCOPY (EGD) WITH PROPOFOL;  Surgeon: Arta Silence, MD;  Location: [facility];  Service: Endoscopy;  Laterality: N/A;   INSERTION OF DIALYSIS CATHETER N/A 12/23/2012   Procedure: INSERTION OF DIALYSIS CATHETER; ULTRASOUND GUIDED;  Surgeon: Angelia Mould, MD;  Location: [place];  Service: Vascular;  Laterality: N/A;   INSERTION OF DIALYSIS CATHETER  10/22/2015   Right IJ non-tunneled HD catheter, placed again in 2019   IR FLUORO GUIDE CV LINE RIGHT  08/18/2019   IR FLUORO GUIDE CV LINE RIGHT  10/07/2019   IR GENERIC HISTORICAL  10/22/2015   IR US GUIDE VASC ACCESS RIGHT 10/22/2015 [facility]   IR GENERIC HISTORICAL  10/22/2015   IR FLUORO GUIDE CV LINE RIGHT 10/22/2015 [facility]   IR GENERIC HISTORICAL  10/23/2015   IR FLUORO GUIDE CV LINE RIGHT 10/23/2015 Marybelle Killings, MD [facility]   LEFT HEART CATHETERIZATION WITH CORONARY ANGIOGRAM N/A 07/13/2013   Procedure: LEFT HEART CATHETERIZATION WITH CORONARY ANGIOGRAM;  Surgeon: Jettie Booze, MD;  Location: [facility] CATH LAB;  Service: Cardiovascular;  Laterality: N/A;   LIGATION OF ARTERIOVENOUS  FISTULA Left 12/22/2012   Procedure: LIGATION OF ARTERIOVENOUS  FISTULA;EXCISION OF LARGE ANEURYSMS;;  Surgeon: Elam Dutch, MD;  Location: [facility] OR;  Service: Vascular;  Laterality: Left;   UPPER GASTROINTESTINAL ENDOSCOPY      Current Medications: Current Meds  Medication Sig   acetaminophen (TYLENOL) 650 MG CR tablet Take 1 tablet (650 mg total) by mouth daily as needed for pain.   amLODipine (NORVASC) 10 MG tablet TAKE 1 TABLET(10 MG) BY MOUTH DAILY   chlorproMAZINE (THORAZINE) 10 MG tablet Take 1 tablet (10 mg total) by mouth 3 (three) times daily as needed for hiccups.   cloNIDine (CATAPRES - DOSED IN MG/24 HR) 0.3 mg/24hr patch Place 0.3 mg onto the skin every Sunday.   Doxercalciferol (HECTOROL IV) Dialysis Monday,Wednesday,friday   hydrALAZINE (APRESOLINE) 25 MG tablet Take 25 mg by mouth daily in the afternoon.   hydrOXYzine (ATARAX/VISTARIL) 25 MG tablet Take 1 tablet (25 mg total) by mouth every 8 (eight) hours as needed for itching. Further refills need to come from your PCP.   Ipratropium-Albuterol (COMBIVENT) 20-100 MCG/ACT AERS respimat Inhale 1 puff into the lungs every 6 (six) hours as needed for wheezing.   isosorbide dinitrate (ISORDIL) 20 MG tablet TAKE 1 TABLET(20 MG) BY MOUTH TWICE DAILY   Methoxy PEG-Epoetin Beta (MIRCERA IJ) Dialysis Monday,Wednesday,friday   metoprolol tartrate (LOPRESSOR) 100 MG tablet Take 1 tablet by mouth twice a day.  ON DIALYSIS DAYS, TAKE AFTER DIALYSIS. (Patient taking differently: daily in the afternoon. Take 1 tablet by mouth twice a day.  ON DIALYSIS DAYS, TAKE AFTER DIALYSIS.)   pantoprazole (PROTONIX) 40 MG tablet Take 1 tablet (40 mg total) by mouth 2 (two) times daily before a meal.   predniSONE (DELTASONE) 50 MG tablet take 1 tablet by mouth daily with breakfast   sevelamer carbonate (RENVELA) 800 MG tablet Take 1,600-3,200  mg by mouth See admin instructions. 2-4 tabs with meals depending on meal size   tamsulosin (FLOMAX) 0.4 MG CAPS capsule Take 0.4 mg by mouth daily.   umeclidinium-vilanterol (ANORO ELLIPTA) 62.5-25 MCG/INH AEPB Inhale 1 puff into the lungs daily.   [DISCONTINUED] azithromycin (ZITHROMAX) 500 MG tablet Take 1 tablet (500 mg total) by mouth daily.   [DISCONTINUED] cefdinir (OMNICEF) 300 MG capsule Take 1 capsule on Saturday, 1 capsule on Monday, 1 capsule on Wednesday     Allergies:   Venofer  [ferric oxide] and Aspirin   Social History   Socioeconomic History   Marital status: Single    Spouse name: Not on file   Number of children: 0   Years of education: 12th  Highest education level: High school graduate  Occupational History   Occupation: n/a  Tobacco Use   Smoking status: Former    Packs/day: 0.50    Years: 16.00    Pack years: 8.00    Types: Cigarettes    Quit date: 07/05/2019    Years since quitting: 1.1   Smokeless tobacco: Never  Vaping Use   Vaping Use: Never used  Substance and Sexual Activity   Alcohol use: Yes    Alcohol/week: 2.0 standard drinks    Types: 2 Shots of liquor per week   Drug use: Yes    Frequency: 2.0 times per week    Types: Marijuana    Comment: last used 11-28-18   Sexual activity: Not Currently  Other Topics Concern   Not on file  Social History Narrative   Patient lives alone in [place].    Patient has never been married and does not have any children.    Patients support system, his mother, pass away 2016.   Patient does not own his own vehicle, uses public transportation with no concerns at this time.   Social Determinants of Health   Financial Resource Strain: Low Risk    Difficulty of Paying Living Expenses: Not very hard  Food Insecurity: No Food Insecurity   Worried About Charity fundraiser in the Last Year: Never true   Ran Out of Food in the Last Year: Never true  Transportation Needs: No Transportation Needs   Lack of  Transportation (Medical): No   Lack of Transportation (Non-Medical): No  Physical Activity: Inactive   Days of Exercise per Week: 0 days   Minutes of Exercise per Session: 0 min  Stress: No Stress Concern Present   Feeling of Stress : Only a little  Social Connections: Socially Isolated   Frequency of Communication with Friends and Family: Twice a week   Frequency of Social Gatherings with Friends and Family: Twice a week   Attends Religious Services: Never   Marine scientist or Organizations: No   Attends Music therapist: Never   Marital Status: Never married     Family History: The patient's family history includes Asthma in his brother; Cerebrovascular Accident in his father; Congestive Heart Failure in his brother; Hypertension in his father and mother. There is no history of Stomach cancer, Rectal cancer, Esophageal cancer, or Colon cancer.  ROS:   Please see the history of present illness.     All other systems reviewed and are negative.  EKGs/Labs/Other Studies Reviewed:    The following studies were reviewed today:  EKG:  EKG is  ordered today.  The ekg ordered today demonstrates  08/28/20: SR 1st HB  Cardiac Event Monitoring: Date: 08/01/2019 Results: Patient had a min HR of 53 bpm, max HR of 148 bpm, and avg HR of 62 BPM. 7 Supraventricular Tachycardia runs occurred, the longest lasting 18 beats with an avg rate of 112 bpm.   Predominant underlying rhythm was Sinus Rhythm. Few very short runs of SVTs that don't correlate with patient's symptoms. Rare PVCs, PACs, 1 blocked sinus beat.   Continue the same management.    Transthoracic Echocardiogram: Date:12/23/2017 Results:- Unable to view images directly - Left ventricle: Septal and inferior basal hypokinesis. The cavity    size was mildly dilated. Wall thickness was normal. Systolic    function was mildly reduced. The estimated ejection fraction was    in the range of 45% to 50%. Doppler  parameters are consistent  with both elevated ventricular end-diastolic filling pressure and    elevated left atrial filling pressure.  - Aortic valve: There was trivial regurgitation.  - Mitral valve: Moderately calcified annulus. Moderately thickened,    moderately calcified leaflets . There was mild to moderate    regurgitation.  - Left atrium: The atrium was moderately dilated.  - Right atrium: The atrium was mildly dilated.  - Atrial septum: No defect or patent foramen ovale was identified.  - Tricuspid valve: There was moderate regurgitation.  - Pulmonary arteries: PA peak pressure: 97 mm Hg (S).  - Impressions: Severe pulmonary hypertension based on TR velocity.  Cardiac CT: Date:03/11/2017 Results: Aortic Atherosclerosis 3VC CAC and proximal RCA calcification  Recent Labs: 08/08/2020: ALT 9 08/10/2020: BUN 49; Creatinine, Ser 12.54; Hemoglobin 8.2; Magnesium 2.1; Platelets 268; Potassium 4.6; Sodium 133  Recent Lipid Panel No results found for: CHOL, TRIG, HDL, CHOLHDL, VLDL, LDLCALC, LDLDIRECT   Physical Exam:    VS:  BP (!) 160/98   Pulse 79   Ht 5' 5"  (1.651 m)   Wt 178 lb (80.7 kg)   SpO2 97%   BMI 29.62 kg/m     Wt Readings from Last 3 Encounters:  08/28/20 178 lb (80.7 kg)  08/10/20 172 lb 9.9 oz (78.3 kg)  08/01/20 180 lb (81.6 kg)     GEN: Well nourished, well developed in no acute distress HEENT: Normal NECK: No JVD LYMPHATICS: No lymphadenopathy CARDIAC: RRR, no murmurs, rubs, gallops; L/R arm Fistulas have no bruit/thrrill RESPIRATORY:  Clear to auscultation without rales, wheezing or rhonchi  ABDOMEN: Soft, non-tender, non-distended MUSCULOSKELETAL:  No edema; No deformity  SKIN: Warm and dry NEUROLOGIC:  Alert and oriented x 3 PSYCHIATRIC:  Normal affect   ASSESSMENT:    1. Heart failure with reduced ejection fraction ([place])   2. Chronic obstructive pulmonary disease, unspecified COPD type ([place])   3. Tobacco abuse   4. Essential hypertension     PLAN:    COPD and active tobacco use (discussed cessation) ESRD HTN & HLD PH NOS,  HFmrEF  Heart Failure Reduced Ejection Fraction  - NYHA class I, Stage C, euvolemic, etiology unclear; ischemia is suspected; reviewed prior CT with PT - Diuretic regimen: per HD with Dr. Edrick Oh - continue norvasc, clonidine per primary and nephrology - will return hydralazine and isorbide dinitrate to BID; return metoprolol to BID for now - will get echocardiogram  - low threshold for LHC and RHC in exertional symptoms do not improve with BP control  Time Spent Directly with Patient:   I have spent a total of 40 minutes with the patient reviewing notes, imaging, EKGs, labs and examining the patient as well as establishing an assessment and plan that was discussed personally with the patient.  > 50% of time was spent in direct patient care and reviewing imaging with patient.    Medication Adjustments/Labs and Tests Ordered: Current medicines are reviewed at length with the patient today.  Concerns regarding medicines are outlined above.  Orders Placed This Encounter  Procedures   EKG 12-Lead   ECHOCARDIOGRAM COMPLETE    No orders of the defined types were placed in this encounter.   Patient Instructions  Medication Instructions:  Your physician recommends that you continue on your current medications as directed. Please refer to the Current Medication list given to you today. PLEASE BE SURE YOU TAKE MEDICATIONS AS ORDERED   *If you need a refill on your cardiac medications before your next appointment, please  call your pharmacy*   Lab Work: NONE If you have labs (blood work) drawn today and your tests are completely normal, you will receive your results only by: [place] (if you have MyChart) OR A paper copy in the mail If you have any lab test that is abnormal or we need to change your treatment, we will call you to review the results.   Testing/Procedures: Your  physician has requested that you have an echocardiogram. Echocardiography is a painless test that uses sound waves to create images of your heart. It provides your doctor with information about the size and shape of your heart and how well your heart's chambers and valves are working. This procedure takes approximately one hour. There are no restrictions for this procedure.  Follow-Up: At [facility], you and your health needs are our priority.  As part of our continuing mission to provide you with exceptional heart care, we have created designated Provider Care Teams.  These Care Teams include your primary Cardiologist (physician) and Advanced Practice Providers (APPs -  Physician Assistants and Nurse Practitioners) who all work together to provide you with the care you need, when you need it.  We recommend signing up for the patient portal called "MyChart".  Sign up information is provided on this After Visit Summary.  MyChart is used to connect with patients for Virtual Visits (Telemedicine).  Patients are able to view lab/test results, encounter notes, upcoming appointments, etc.  Non-urgent messages can be sent to your provider as well.   To learn more about what you can do with MyChart, go to [email or web address].    Your next appointment:   2 month(s)  The format for your next appointment:   In Person  Provider:   You may see Werner Lean, MD or one of the following Advanced Practice Providers on your designated Care Team:   Melina Copa, PA-C Ermalinda Barrios, PA-C     Signed, Werner Lean, MD  08/28/2020 2:36 PM    [place]

## 2020-08-29 DIAGNOSIS — N2581 Secondary hyperparathyroidism of renal origin: Secondary | ICD-10-CM | POA: Diagnosis not present

## 2020-08-29 DIAGNOSIS — D509 Iron deficiency anemia, unspecified: Secondary | ICD-10-CM | POA: Diagnosis not present

## 2020-08-29 DIAGNOSIS — J158 Pneumonia due to other specified bacteria: Secondary | ICD-10-CM | POA: Diagnosis not present

## 2020-08-29 DIAGNOSIS — Z992 Dependence on renal dialysis: Secondary | ICD-10-CM | POA: Diagnosis not present

## 2020-08-29 DIAGNOSIS — D631 Anemia in chronic kidney disease: Secondary | ICD-10-CM | POA: Diagnosis not present

## 2020-08-29 DIAGNOSIS — N186 End stage renal disease: Secondary | ICD-10-CM | POA: Diagnosis not present

## 2020-08-31 DIAGNOSIS — N2581 Secondary hyperparathyroidism of renal origin: Secondary | ICD-10-CM | POA: Diagnosis not present

## 2020-08-31 DIAGNOSIS — D631 Anemia in chronic kidney disease: Secondary | ICD-10-CM | POA: Diagnosis not present

## 2020-08-31 DIAGNOSIS — D509 Iron deficiency anemia, unspecified: Secondary | ICD-10-CM | POA: Diagnosis not present

## 2020-08-31 DIAGNOSIS — J158 Pneumonia due to other specified bacteria: Secondary | ICD-10-CM | POA: Diagnosis not present

## 2020-08-31 DIAGNOSIS — N186 End stage renal disease: Secondary | ICD-10-CM | POA: Diagnosis not present

## 2020-08-31 DIAGNOSIS — Z992 Dependence on renal dialysis: Secondary | ICD-10-CM | POA: Diagnosis not present

## 2020-09-03 DIAGNOSIS — N2581 Secondary hyperparathyroidism of renal origin: Secondary | ICD-10-CM | POA: Diagnosis not present

## 2020-09-03 DIAGNOSIS — D631 Anemia in chronic kidney disease: Secondary | ICD-10-CM | POA: Diagnosis not present

## 2020-09-03 DIAGNOSIS — Z992 Dependence on renal dialysis: Secondary | ICD-10-CM | POA: Diagnosis not present

## 2020-09-03 DIAGNOSIS — J158 Pneumonia due to other specified bacteria: Secondary | ICD-10-CM | POA: Diagnosis not present

## 2020-09-03 DIAGNOSIS — N186 End stage renal disease: Secondary | ICD-10-CM | POA: Diagnosis not present

## 2020-09-03 DIAGNOSIS — I12 Hypertensive chronic kidney disease with 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: Secondary | ICD-10-CM | POA: Diagnosis not present

## 2020-09-03 DIAGNOSIS — D509 Iron deficiency anemia, unspecified: Secondary | ICD-10-CM | POA: Diagnosis not present

## 2020-09-03 DIAGNOSIS — R3915 Urgency of urination: Secondary | ICD-10-CM | POA: Diagnosis not present

## 2020-09-03 DIAGNOSIS — N400 Enlarged prostate without lower urinary tract symptoms: Secondary | ICD-10-CM | POA: Diagnosis not present

## 2020-09-05 ENCOUNTER — Other Ambulatory Visit: Payer: Self-pay | Admitting: Family Medicine

## 2020-09-05 ENCOUNTER — Other Ambulatory Visit: Payer: Self-pay

## 2020-09-05 ENCOUNTER — Ambulatory Visit (INDEPENDENT_AMBULATORY_CARE_PROVIDER_SITE_OTHER): Payer: Medicare Other | Admitting: Family Medicine

## 2020-09-05 ENCOUNTER — Encounter: Payer: Self-pay | Admitting: Family Medicine

## 2020-09-05 VITALS — BP 130/68 | HR 69 | Ht 65.0 in | Wt 176.8 lb

## 2020-09-05 DIAGNOSIS — N186 End stage renal disease: Secondary | ICD-10-CM | POA: Diagnosis not present

## 2020-09-05 DIAGNOSIS — N2581 Secondary hyperparathyroidism of renal origin: Secondary | ICD-10-CM | POA: Diagnosis not present

## 2020-09-05 DIAGNOSIS — D509 Iron deficiency anemia, unspecified: Secondary | ICD-10-CM | POA: Diagnosis not present

## 2020-09-05 DIAGNOSIS — N400 Enlarged prostate without lower urinary tract symptoms: Secondary | ICD-10-CM | POA: Diagnosis not present

## 2020-09-05 DIAGNOSIS — R3915 Urgency of urination: Secondary | ICD-10-CM | POA: Diagnosis not present

## 2020-09-05 DIAGNOSIS — Z992 Dependence on renal dialysis: Secondary | ICD-10-CM | POA: Diagnosis not present

## 2020-09-05 DIAGNOSIS — D631 Anemia in chronic kidney disease: Secondary | ICD-10-CM | POA: Diagnosis not present

## 2020-09-05 DIAGNOSIS — J158 Pneumonia due to other specified bacteria: Secondary | ICD-10-CM | POA: Diagnosis not present

## 2020-09-05 MED ORDER — TAMSULOSIN HCL 0.4 MG PO CAPS: 0.8000 mg | ORAL_CAPSULE | Freq: Every day | ORAL | 0 refills | Status: DC

## 2020-09-05 NOTE — Assessment & Plan Note (Signed)
Patient with history of BPH.  Currently complaining of a constant urge to urinate.  Was started on Flomax 0.4 daily with great improvement in symptoms but the symptoms have returned.  Increase the dose of Flomax to 0.8 today and placed referral for urology.  No further questions or concerns at this time.

## 2020-09-05 NOTE — Progress Notes (Signed)
    SUBJECTIVE:   CHIEF COMPLAINT / HPI:   Urge to urinate Patient reports that he has had issues in the past with feeling the urge to urinate.  He is a dialysis patient but still makes a slight amount of urine each day.  He was evaluated by his nephrologist who started him on Flomax which helped greatly with the urge to urinate.  Over the last 2 weeks he feels like the medication is not helping at all.  He has been told in the past that he has a very enlarged prostate and is concerned that this may be the cause.  He is requesting referral to urology for further evaluation.  Denies any increased urination, pelvic pain.   OBJECTIVE:   BP 130/68   Pulse 69   Ht 5' 5"  (1.651 m)   Wt 176 lb 12.8 oz (80.2 kg)   SpO2 100%   BMI 29.42 kg/m   General: Well-appearing 55 year old male, no acute distress Cardiac: Regular rate and rhythm, no murmurs appreciated Respiratory: Normal work of breathing Abdomen: Soft, nontender, positive bowel sounds, no suprapubic tenderness  ASSESSMENT/PLAN:   BPH (benign prostatic hyperplasia) Patient with history of BPH.  Currently complaining of a constant urge to urinate.  Was started on Flomax 0.4 daily with great improvement in symptoms but the symptoms have returned.  Increase the dose of Flomax to 0.8 today and placed referral for urology.  No further questions or concerns at this time.     [name], MD [place]

## 2020-09-05 NOTE — Patient Instructions (Signed)
It was a pleasure seeing you today.  I am sorry you are having issues with this urge to urinate.  I have sent a referral for urology and someone should call you to schedule an appointment.  I also increased your dose of Flomax to 0.8 mg from 0.4 mg.  If you have any questions or concerns please call the clinic.  I hope you have a wonderful afternoon!

## 2020-09-07 DIAGNOSIS — N2581 Secondary hyperparathyroidism of renal origin: Secondary | ICD-10-CM | POA: Diagnosis not present

## 2020-09-07 DIAGNOSIS — D631 Anemia in chronic kidney disease: Secondary | ICD-10-CM | POA: Diagnosis not present

## 2020-09-07 DIAGNOSIS — Z992 Dependence on renal dialysis: Secondary | ICD-10-CM | POA: Diagnosis not present

## 2020-09-07 DIAGNOSIS — N186 End stage renal disease: Secondary | ICD-10-CM | POA: Diagnosis not present

## 2020-09-07 DIAGNOSIS — D509 Iron deficiency anemia, unspecified: Secondary | ICD-10-CM | POA: Diagnosis not present

## 2020-09-07 DIAGNOSIS — J158 Pneumonia due to other specified bacteria: Secondary | ICD-10-CM | POA: Diagnosis not present

## 2020-09-10 DIAGNOSIS — N186 End stage renal disease: Secondary | ICD-10-CM | POA: Diagnosis not present

## 2020-09-10 DIAGNOSIS — D509 Iron deficiency anemia, unspecified: Secondary | ICD-10-CM | POA: Diagnosis not present

## 2020-09-10 DIAGNOSIS — Z992 Dependence on renal dialysis: Secondary | ICD-10-CM | POA: Diagnosis not present

## 2020-09-10 DIAGNOSIS — J158 Pneumonia due to other specified bacteria: Secondary | ICD-10-CM | POA: Diagnosis not present

## 2020-09-10 DIAGNOSIS — D631 Anemia in chronic kidney disease: Secondary | ICD-10-CM | POA: Diagnosis not present

## 2020-09-10 DIAGNOSIS — N2581 Secondary hyperparathyroidism of renal origin: Secondary | ICD-10-CM | POA: Diagnosis not present

## 2020-09-12 DIAGNOSIS — N186 End stage renal disease: Secondary | ICD-10-CM | POA: Diagnosis not present

## 2020-09-12 DIAGNOSIS — D509 Iron deficiency anemia, unspecified: Secondary | ICD-10-CM | POA: Diagnosis not present

## 2020-09-12 DIAGNOSIS — N2581 Secondary hyperparathyroidism of renal origin: Secondary | ICD-10-CM | POA: Diagnosis not present

## 2020-09-12 DIAGNOSIS — J158 Pneumonia due to other specified bacteria: Secondary | ICD-10-CM | POA: Diagnosis not present

## 2020-09-12 DIAGNOSIS — D631 Anemia in chronic kidney disease: Secondary | ICD-10-CM | POA: Diagnosis not present

## 2020-09-12 DIAGNOSIS — Z992 Dependence on renal dialysis: Secondary | ICD-10-CM | POA: Diagnosis not present

## 2020-09-13 ENCOUNTER — Other Ambulatory Visit: Payer: Self-pay

## 2020-09-13 ENCOUNTER — Ambulatory Visit (HOSPITAL_COMMUNITY): Payer: Medicare Other | Attending: Cardiology

## 2020-09-13 ENCOUNTER — Telehealth: Payer: Self-pay

## 2020-09-13 DIAGNOSIS — I5020 Unspecified systolic (congestive) heart failure: Secondary | ICD-10-CM | POA: Insufficient documentation

## 2020-09-13 LAB — ECHOCARDIOGRAM COMPLETE
AR max vel: 1.84 cm2
AV Area VTI: 1.85 cm2
AV Area mean vel: 1.76 cm2
AV Mean grad: 10 mmHg
AV Peak grad: 19.3 mmHg
Ao pk vel: 2.2 m/s
Area-P 1/2: 2.17 cm2
MV M vel: 4.78 m/s
MV Peak grad: 91.4 mmHg
S' Lateral: 3.4 cm

## 2020-09-13 MED ORDER — HYDRALAZINE HCL 25 MG PO TABS: 25.0000 mg | ORAL_TABLET | Freq: Two times a day (BID) | ORAL | 3 refills | Status: DC

## 2020-09-13 NOTE — Telephone Encounter (Signed)
Pt expressed that he is already taking hydralazine 25 mg PO BID.  All questions answered and orders placed.

## 2020-09-13 NOTE — Telephone Encounter (Signed)
-----   Message from Werner Lean, MD sent at 09/13/2020 12:28 PM EDT ----- Results: Aortic Sclerosis LVEF has improved BP is still elevated Plan: Increase hydralazine to 25 mg PO BID; may uptitrate further for HTN  Werner Lean, MD

## 2020-09-14 DIAGNOSIS — J158 Pneumonia due to other specified bacteria: Secondary | ICD-10-CM | POA: Diagnosis not present

## 2020-09-14 DIAGNOSIS — N2581 Secondary hyperparathyroidism of renal origin: Secondary | ICD-10-CM | POA: Diagnosis not present

## 2020-09-14 DIAGNOSIS — N186 End stage renal disease: Secondary | ICD-10-CM | POA: Diagnosis not present

## 2020-09-14 DIAGNOSIS — D509 Iron deficiency anemia, unspecified: Secondary | ICD-10-CM | POA: Diagnosis not present

## 2020-09-14 DIAGNOSIS — Z992 Dependence on renal dialysis: Secondary | ICD-10-CM | POA: Diagnosis not present

## 2020-09-14 DIAGNOSIS — D631 Anemia in chronic kidney disease: Secondary | ICD-10-CM | POA: Diagnosis not present

## 2020-09-17 DIAGNOSIS — D509 Iron deficiency anemia, unspecified: Secondary | ICD-10-CM | POA: Diagnosis not present

## 2020-09-17 DIAGNOSIS — N2581 Secondary hyperparathyroidism of renal origin: Secondary | ICD-10-CM | POA: Diagnosis not present

## 2020-09-17 DIAGNOSIS — Z992 Dependence on renal dialysis: Secondary | ICD-10-CM | POA: Diagnosis not present

## 2020-09-17 DIAGNOSIS — N186 End stage renal disease: Secondary | ICD-10-CM | POA: Diagnosis not present

## 2020-09-17 DIAGNOSIS — D631 Anemia in chronic kidney disease: Secondary | ICD-10-CM | POA: Diagnosis not present

## 2020-09-17 DIAGNOSIS — J158 Pneumonia due to other specified bacteria: Secondary | ICD-10-CM | POA: Diagnosis not present

## 2020-09-19 DIAGNOSIS — N186 End stage renal disease: Secondary | ICD-10-CM | POA: Diagnosis not present

## 2020-09-19 DIAGNOSIS — D631 Anemia in chronic kidney disease: Secondary | ICD-10-CM | POA: Diagnosis not present

## 2020-09-19 DIAGNOSIS — J158 Pneumonia due to other specified bacteria: Secondary | ICD-10-CM | POA: Diagnosis not present

## 2020-09-19 DIAGNOSIS — D509 Iron deficiency anemia, unspecified: Secondary | ICD-10-CM | POA: Diagnosis not present

## 2020-09-19 DIAGNOSIS — Z992 Dependence on renal dialysis: Secondary | ICD-10-CM | POA: Diagnosis not present

## 2020-09-19 DIAGNOSIS — N2581 Secondary hyperparathyroidism of renal origin: Secondary | ICD-10-CM | POA: Diagnosis not present

## 2020-09-21 DIAGNOSIS — Z992 Dependence on renal dialysis: Secondary | ICD-10-CM | POA: Diagnosis not present

## 2020-09-21 DIAGNOSIS — D631 Anemia in chronic kidney disease: Secondary | ICD-10-CM | POA: Diagnosis not present

## 2020-09-21 DIAGNOSIS — N2581 Secondary hyperparathyroidism of renal origin: Secondary | ICD-10-CM | POA: Diagnosis not present

## 2020-09-21 DIAGNOSIS — J158 Pneumonia due to other specified bacteria: Secondary | ICD-10-CM | POA: Diagnosis not present

## 2020-09-21 DIAGNOSIS — N186 End stage renal disease: Secondary | ICD-10-CM | POA: Diagnosis not present

## 2020-09-21 DIAGNOSIS — D509 Iron deficiency anemia, unspecified: Secondary | ICD-10-CM | POA: Diagnosis not present

## 2020-09-22 DIAGNOSIS — Z1152 Encounter for screening for COVID-19: Secondary | ICD-10-CM | POA: Diagnosis not present

## 2020-09-24 DIAGNOSIS — N186 End stage renal disease: Secondary | ICD-10-CM | POA: Diagnosis not present

## 2020-09-24 DIAGNOSIS — Z992 Dependence on renal dialysis: Secondary | ICD-10-CM | POA: Diagnosis not present

## 2020-09-24 DIAGNOSIS — D631 Anemia in chronic kidney disease: Secondary | ICD-10-CM | POA: Diagnosis not present

## 2020-09-24 DIAGNOSIS — N2581 Secondary hyperparathyroidism of renal origin: Secondary | ICD-10-CM | POA: Diagnosis not present

## 2020-09-24 DIAGNOSIS — J158 Pneumonia due to other specified bacteria: Secondary | ICD-10-CM | POA: Diagnosis not present

## 2020-09-24 DIAGNOSIS — D509 Iron deficiency anemia, unspecified: Secondary | ICD-10-CM | POA: Diagnosis not present

## 2020-09-26 DIAGNOSIS — Z992 Dependence on renal dialysis: Secondary | ICD-10-CM | POA: Diagnosis not present

## 2020-09-26 DIAGNOSIS — D509 Iron deficiency anemia, unspecified: Secondary | ICD-10-CM | POA: Diagnosis not present

## 2020-09-26 DIAGNOSIS — N2581 Secondary hyperparathyroidism of renal origin: Secondary | ICD-10-CM | POA: Diagnosis not present

## 2020-09-26 DIAGNOSIS — N186 End stage renal disease: Secondary | ICD-10-CM | POA: Diagnosis not present

## 2020-09-26 DIAGNOSIS — D631 Anemia in chronic kidney disease: Secondary | ICD-10-CM | POA: Diagnosis not present

## 2020-09-26 DIAGNOSIS — J158 Pneumonia due to other specified bacteria: Secondary | ICD-10-CM | POA: Diagnosis not present

## 2020-09-28 DIAGNOSIS — N2581 Secondary hyperparathyroidism of renal origin: Secondary | ICD-10-CM | POA: Diagnosis not present

## 2020-09-28 DIAGNOSIS — Z992 Dependence on renal dialysis: Secondary | ICD-10-CM | POA: Diagnosis not present

## 2020-09-28 DIAGNOSIS — J158 Pneumonia due to other specified bacteria: Secondary | ICD-10-CM | POA: Diagnosis not present

## 2020-09-28 DIAGNOSIS — D631 Anemia in chronic kidney disease: Secondary | ICD-10-CM | POA: Diagnosis not present

## 2020-09-28 DIAGNOSIS — D509 Iron deficiency anemia, unspecified: Secondary | ICD-10-CM | POA: Diagnosis not present

## 2020-09-28 DIAGNOSIS — N186 End stage renal disease: Secondary | ICD-10-CM | POA: Diagnosis not present

## 2020-10-01 DIAGNOSIS — D631 Anemia in chronic kidney disease: Secondary | ICD-10-CM | POA: Diagnosis not present

## 2020-10-01 DIAGNOSIS — J158 Pneumonia due to other specified bacteria: Secondary | ICD-10-CM | POA: Diagnosis not present

## 2020-10-01 DIAGNOSIS — N186 End stage renal disease: Secondary | ICD-10-CM | POA: Diagnosis not present

## 2020-10-01 DIAGNOSIS — D509 Iron deficiency anemia, unspecified: Secondary | ICD-10-CM | POA: Diagnosis not present

## 2020-10-01 DIAGNOSIS — N2581 Secondary hyperparathyroidism of renal origin: Secondary | ICD-10-CM | POA: Diagnosis not present

## 2020-10-01 DIAGNOSIS — Z992 Dependence on renal dialysis: Secondary | ICD-10-CM | POA: Diagnosis not present

## 2020-10-03 DIAGNOSIS — J158 Pneumonia due to other specified bacteria: Secondary | ICD-10-CM | POA: Diagnosis not present

## 2020-10-03 DIAGNOSIS — N186 End stage renal disease: Secondary | ICD-10-CM | POA: Diagnosis not present

## 2020-10-03 DIAGNOSIS — D509 Iron deficiency anemia, unspecified: Secondary | ICD-10-CM | POA: Diagnosis not present

## 2020-10-03 DIAGNOSIS — D631 Anemia in chronic kidney disease: Secondary | ICD-10-CM | POA: Diagnosis not present

## 2020-10-03 DIAGNOSIS — N2581 Secondary hyperparathyroidism of renal origin: Secondary | ICD-10-CM | POA: Diagnosis not present

## 2020-10-03 DIAGNOSIS — Z992 Dependence on renal dialysis: Secondary | ICD-10-CM | POA: Diagnosis not present

## 2020-10-04 DIAGNOSIS — N186 End stage renal disease: Secondary | ICD-10-CM | POA: Diagnosis not present

## 2020-10-04 DIAGNOSIS — I12 Hypertensive chronic kidney disease with 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: Secondary | ICD-10-CM | POA: Diagnosis not present

## 2020-10-04 DIAGNOSIS — Z992 Dependence on renal dialysis: Secondary | ICD-10-CM | POA: Diagnosis not present

## 2020-10-05 DIAGNOSIS — Z992 Dependence on renal dialysis: Secondary | ICD-10-CM | POA: Diagnosis not present

## 2020-10-05 DIAGNOSIS — N186 End stage renal disease: Secondary | ICD-10-CM | POA: Diagnosis not present

## 2020-10-05 DIAGNOSIS — D509 Iron deficiency anemia, unspecified: Secondary | ICD-10-CM | POA: Diagnosis not present

## 2020-10-05 DIAGNOSIS — D631 Anemia in chronic kidney disease: Secondary | ICD-10-CM | POA: Diagnosis not present

## 2020-10-05 DIAGNOSIS — N2581 Secondary hyperparathyroidism of renal origin: Secondary | ICD-10-CM | POA: Diagnosis not present

## 2020-10-05 DIAGNOSIS — J158 Pneumonia due to other specified bacteria: Secondary | ICD-10-CM | POA: Diagnosis not present

## 2020-10-08 DIAGNOSIS — D631 Anemia in chronic kidney disease: Secondary | ICD-10-CM | POA: Diagnosis not present

## 2020-10-08 DIAGNOSIS — Z992 Dependence on renal dialysis: Secondary | ICD-10-CM | POA: Diagnosis not present

## 2020-10-08 DIAGNOSIS — N2581 Secondary hyperparathyroidism of renal origin: Secondary | ICD-10-CM | POA: Diagnosis not present

## 2020-10-08 DIAGNOSIS — N186 End stage renal disease: Secondary | ICD-10-CM | POA: Diagnosis not present

## 2020-10-08 DIAGNOSIS — J158 Pneumonia due to other specified bacteria: Secondary | ICD-10-CM | POA: Diagnosis not present

## 2020-10-08 DIAGNOSIS — D509 Iron deficiency anemia, unspecified: Secondary | ICD-10-CM | POA: Diagnosis not present

## 2020-10-10 DIAGNOSIS — N2581 Secondary hyperparathyroidism of renal origin: Secondary | ICD-10-CM | POA: Diagnosis not present

## 2020-10-10 DIAGNOSIS — D509 Iron deficiency anemia, unspecified: Secondary | ICD-10-CM | POA: Diagnosis not present

## 2020-10-10 DIAGNOSIS — Z992 Dependence on renal dialysis: Secondary | ICD-10-CM | POA: Diagnosis not present

## 2020-10-10 DIAGNOSIS — J158 Pneumonia due to other specified bacteria: Secondary | ICD-10-CM | POA: Diagnosis not present

## 2020-10-10 DIAGNOSIS — D631 Anemia in chronic kidney disease: Secondary | ICD-10-CM | POA: Diagnosis not present

## 2020-10-10 DIAGNOSIS — N186 End stage renal disease: Secondary | ICD-10-CM | POA: Diagnosis not present

## 2020-10-12 DIAGNOSIS — D509 Iron deficiency anemia, unspecified: Secondary | ICD-10-CM | POA: Diagnosis not present

## 2020-10-12 DIAGNOSIS — Z992 Dependence on renal dialysis: Secondary | ICD-10-CM | POA: Diagnosis not present

## 2020-10-12 DIAGNOSIS — N2581 Secondary hyperparathyroidism of renal origin: Secondary | ICD-10-CM | POA: Diagnosis not present

## 2020-10-12 DIAGNOSIS — N186 End stage renal disease: Secondary | ICD-10-CM | POA: Diagnosis not present

## 2020-10-12 DIAGNOSIS — D631 Anemia in chronic kidney disease: Secondary | ICD-10-CM | POA: Diagnosis not present

## 2020-10-12 DIAGNOSIS — J158 Pneumonia due to other specified bacteria: Secondary | ICD-10-CM | POA: Diagnosis not present

## 2020-10-15 DIAGNOSIS — N2581 Secondary hyperparathyroidism of renal origin: Secondary | ICD-10-CM | POA: Diagnosis not present

## 2020-10-15 DIAGNOSIS — D631 Anemia in chronic kidney disease: Secondary | ICD-10-CM | POA: Diagnosis not present

## 2020-10-15 DIAGNOSIS — N186 End stage renal disease: Secondary | ICD-10-CM | POA: Diagnosis not present

## 2020-10-15 DIAGNOSIS — Z992 Dependence on renal dialysis: Secondary | ICD-10-CM | POA: Diagnosis not present

## 2020-10-15 DIAGNOSIS — D509 Iron deficiency anemia, unspecified: Secondary | ICD-10-CM | POA: Diagnosis not present

## 2020-10-15 DIAGNOSIS — J158 Pneumonia due to other specified bacteria: Secondary | ICD-10-CM | POA: Diagnosis not present

## 2020-10-17 DIAGNOSIS — D509 Iron deficiency anemia, unspecified: Secondary | ICD-10-CM | POA: Diagnosis not present

## 2020-10-17 DIAGNOSIS — N186 End stage renal disease: Secondary | ICD-10-CM | POA: Diagnosis not present

## 2020-10-17 DIAGNOSIS — D631 Anemia in chronic kidney disease: Secondary | ICD-10-CM | POA: Diagnosis not present

## 2020-10-17 DIAGNOSIS — J158 Pneumonia due to other specified bacteria: Secondary | ICD-10-CM | POA: Diagnosis not present

## 2020-10-17 DIAGNOSIS — N2581 Secondary hyperparathyroidism of renal origin: Secondary | ICD-10-CM | POA: Diagnosis not present

## 2020-10-17 DIAGNOSIS — Z992 Dependence on renal dialysis: Secondary | ICD-10-CM | POA: Diagnosis not present

## 2020-10-19 DIAGNOSIS — Z992 Dependence on renal dialysis: Secondary | ICD-10-CM | POA: Diagnosis not present

## 2020-10-19 DIAGNOSIS — D631 Anemia in chronic kidney disease: Secondary | ICD-10-CM | POA: Diagnosis not present

## 2020-10-19 DIAGNOSIS — J158 Pneumonia due to other specified bacteria: Secondary | ICD-10-CM | POA: Diagnosis not present

## 2020-10-19 DIAGNOSIS — D509 Iron deficiency anemia, unspecified: Secondary | ICD-10-CM | POA: Diagnosis not present

## 2020-10-19 DIAGNOSIS — N186 End stage renal disease: Secondary | ICD-10-CM | POA: Diagnosis not present

## 2020-10-19 DIAGNOSIS — N2581 Secondary hyperparathyroidism of renal origin: Secondary | ICD-10-CM | POA: Diagnosis not present

## 2020-10-22 DIAGNOSIS — N186 End stage renal disease: Secondary | ICD-10-CM | POA: Diagnosis not present

## 2020-10-22 DIAGNOSIS — N2581 Secondary hyperparathyroidism of renal origin: Secondary | ICD-10-CM | POA: Diagnosis not present

## 2020-10-22 DIAGNOSIS — D631 Anemia in chronic kidney disease: Secondary | ICD-10-CM | POA: Diagnosis not present

## 2020-10-22 DIAGNOSIS — J158 Pneumonia due to other specified bacteria: Secondary | ICD-10-CM | POA: Diagnosis not present

## 2020-10-22 DIAGNOSIS — Z992 Dependence on renal dialysis: Secondary | ICD-10-CM | POA: Diagnosis not present

## 2020-10-22 DIAGNOSIS — D509 Iron deficiency anemia, unspecified: Secondary | ICD-10-CM | POA: Diagnosis not present

## 2020-10-23 DIAGNOSIS — T8249XA Other complication of vascular dialysis catheter, initial encounter: Secondary | ICD-10-CM | POA: Diagnosis not present

## 2020-10-23 DIAGNOSIS — N186 End stage renal disease: Secondary | ICD-10-CM | POA: Diagnosis not present

## 2020-10-23 DIAGNOSIS — Z992 Dependence on renal dialysis: Secondary | ICD-10-CM | POA: Diagnosis not present

## 2020-10-24 DIAGNOSIS — D631 Anemia in chronic kidney disease: Secondary | ICD-10-CM | POA: Diagnosis not present

## 2020-10-24 DIAGNOSIS — J158 Pneumonia due to other specified bacteria: Secondary | ICD-10-CM | POA: Diagnosis not present

## 2020-10-24 DIAGNOSIS — D509 Iron deficiency anemia, unspecified: Secondary | ICD-10-CM | POA: Diagnosis not present

## 2020-10-24 DIAGNOSIS — N186 End stage renal disease: Secondary | ICD-10-CM | POA: Diagnosis not present

## 2020-10-24 DIAGNOSIS — Z992 Dependence on renal dialysis: Secondary | ICD-10-CM | POA: Diagnosis not present

## 2020-10-24 DIAGNOSIS — N2581 Secondary hyperparathyroidism of renal origin: Secondary | ICD-10-CM | POA: Diagnosis not present

## 2020-10-29 DIAGNOSIS — N2581 Secondary hyperparathyroidism of renal origin: Secondary | ICD-10-CM | POA: Diagnosis not present

## 2020-10-29 DIAGNOSIS — N186 End stage renal disease: Secondary | ICD-10-CM | POA: Diagnosis not present

## 2020-10-29 DIAGNOSIS — Z992 Dependence on renal dialysis: Secondary | ICD-10-CM | POA: Diagnosis not present

## 2020-10-29 DIAGNOSIS — J158 Pneumonia due to other specified bacteria: Secondary | ICD-10-CM | POA: Diagnosis not present

## 2020-10-29 DIAGNOSIS — D509 Iron deficiency anemia, unspecified: Secondary | ICD-10-CM | POA: Diagnosis not present

## 2020-10-29 DIAGNOSIS — D631 Anemia in chronic kidney disease: Secondary | ICD-10-CM | POA: Diagnosis not present

## 2020-10-31 DIAGNOSIS — D631 Anemia in chronic kidney disease: Secondary | ICD-10-CM | POA: Diagnosis not present

## 2020-10-31 DIAGNOSIS — N186 End stage renal disease: Secondary | ICD-10-CM | POA: Diagnosis not present

## 2020-10-31 DIAGNOSIS — D509 Iron deficiency anemia, unspecified: Secondary | ICD-10-CM | POA: Diagnosis not present

## 2020-10-31 DIAGNOSIS — J158 Pneumonia due to other specified bacteria: Secondary | ICD-10-CM | POA: Diagnosis not present

## 2020-10-31 DIAGNOSIS — Z992 Dependence on renal dialysis: Secondary | ICD-10-CM | POA: Diagnosis not present

## 2020-10-31 DIAGNOSIS — N2581 Secondary hyperparathyroidism of renal origin: Secondary | ICD-10-CM | POA: Diagnosis not present

## 2020-11-01 ENCOUNTER — Ambulatory Visit (INDEPENDENT_AMBULATORY_CARE_PROVIDER_SITE_OTHER): Payer: Medicare Other | Admitting: Internal Medicine

## 2020-11-01 ENCOUNTER — Encounter: Payer: Self-pay | Admitting: Internal Medicine

## 2020-11-01 ENCOUNTER — Other Ambulatory Visit: Payer: Self-pay

## 2020-11-01 VITALS — BP 122/74 | HR 65 | Ht 65.0 in | Wt 178.4 lb

## 2020-11-01 DIAGNOSIS — I5020 Unspecified systolic (congestive) heart failure: Secondary | ICD-10-CM | POA: Diagnosis not present

## 2020-11-01 DIAGNOSIS — N186 End stage renal disease: Secondary | ICD-10-CM

## 2020-11-01 DIAGNOSIS — I428 Other cardiomyopathies: Secondary | ICD-10-CM

## 2020-11-01 DIAGNOSIS — D631 Anemia in chronic kidney disease: Secondary | ICD-10-CM

## 2020-11-01 DIAGNOSIS — N189 Chronic kidney disease, unspecified: Secondary | ICD-10-CM | POA: Diagnosis not present

## 2020-11-01 DIAGNOSIS — D509 Iron deficiency anemia, unspecified: Secondary | ICD-10-CM

## 2020-11-01 DIAGNOSIS — I110 Hypertensive heart disease with heart failure: Secondary | ICD-10-CM

## 2020-11-01 DIAGNOSIS — I10 Essential (primary) hypertension: Secondary | ICD-10-CM | POA: Diagnosis not present

## 2020-11-01 DIAGNOSIS — Z992 Dependence on renal dialysis: Secondary | ICD-10-CM

## 2020-11-01 MED ORDER — METOPROLOL TARTRATE 100 MG PO TABS: ORAL_TABLET | ORAL | 3 refills | Status: DC

## 2020-11-01 MED ORDER — ISOSORBIDE DINITRATE 20 MG PO TABS: ORAL_TABLET | ORAL | 3 refills | Status: DC

## 2020-11-01 NOTE — Progress Notes (Signed)
Cardiology Office Note:    Date:  11/01/2020   ID:  James Wall, Prabhakar 1965/02/09, MRN [id number]  PCP:  [name], MD   [facility] Providers Cardiologist:  Werner Lean, MD     Referring MD: [name], MD   HD:QQIWLN up HF  History of Present Illness:    James Wall is a 55 y.o. male with a hx of COPD and active tobacco abuse, ESRD via HD cath, HTN, HLD, PH NOS, history of Cirrhosis, HFmrEF who presents for evaluation 08/28/20.  In internal has had improvement in his LVEF.  Still has had elevated in his blood pressure.  Seen 11/01/20.  Patient notes that he is feeling that he has even less energy that he had prior.  Since last visit notes improvement in his LVEF.  There are no interval hospital/ED visit.    Has these moments where he feels like he is going to drop.  Has not need any more antibiotics.  No chest pain or pressure.  No SOB/DOE and no PND/Orthopnea.  No weight gain or leg swelling.  No further palpitations.  Just feels a bit off: last Wednesday with HD was shivering cold.  In Friday felt a runny nose with a temperature of 101.  On Monday temperature had improved.     Past Medical History:  Diagnosis Date   Anemia    Anxiety    Arthritis    GOUT - pt not sure if this is true- not sure 11-30-18   AV fistula occlusion ([place]) 10/2015   Blood transfusion without reported diagnosis    Cirrhosis, nonalcoholic ([place])    COPD (chronic obstructive pulmonary disease) ([place])    Depression    ESRD on hemodialysis ([place])    Started HD Jan 2009.  ESRD was due to HTN.  Dx'd with HTN in hospital 1996 according to pt, they had to keep him so he could get Medicaid to afford the BP medications.  First saw a nephrologist and started HD in the same year 2009.  Gets HD at [facility] on a MWF schedule.  Does not have DM. He had a left RC AVF that never functioned, a left upper arm AVF that worked for about 5 years and as of June   GERD (gastroesophageal  reflux disease)    Heart murmur    as a child per pt   Hyperlipidemia    Hypertension    NICM (nonischemic cardiomyopathy) ([place])    PSVT (paroxysmal supraventricular tachycardia) ([place])    Pulmonary hypertension ([place])    PVC's (premature ventricular contractions)    Secondary hyperparathyroidism ([place])    Sepsis ([place]) 02/2013   from AVF , treated with Vancomycin.   Sleep apnea    no  longer using cpap    Past Surgical History:  Procedure Laterality Date   AV FISTULA PLACEMENT  2009   Left lower arm AVF   AV FISTULA PLACEMENT Right 02/22/2013   Procedure:  CREATION  OF BRACHIAL CEPHALIC FISTULA RIGHT ARM;  Surgeon: Elam Dutch, MD;  Location[facility] OR;  Service: Vascular;  Laterality: Right;   AV FISTULA PLACEMENT Left 08/10/2014   Procedure: BASILIC VEIN TRANSPOSITION  ARTERIOVENOUS (AV) FISTULA CREATION LEFT UPPER ARM;  Surgeon: Mal Misty, MD;  Location: [facility] OR;  Service: Vascular;  Laterality: Left;   COLONOSCOPY     ESOPHAGOGASTRODUODENOSCOPY (EGD) WITH PROPOFOL N/A 04/12/2013   Procedure: ESOPHAGOGASTRODUODENOSCOPY (EGD) WITH PROPOFOL;  Surgeon: Arta Silence, MD;  Location: [facility] ENDOSCOPY;  Service: Endoscopy;  Laterality: N/A;   INSERTION OF DIALYSIS CATHETER N/A 12/23/2012   Procedure: INSERTION OF DIALYSIS CATHETER; ULTRASOUND GUIDED;  Surgeon: Angelia Mould, MD;  Location: [place];  Service: Vascular;  Laterality: N/A;   INSERTION OF DIALYSIS CATHETER  10/22/2015   Right IJ non-tunneled HD catheter, placed again in 2019   IR FLUORO GUIDE CV LINE RIGHT  08/18/2019   IR FLUORO GUIDE CV LINE RIGHT  10/07/2019   IR GENERIC HISTORICAL  10/22/2015   IR US GUIDE VASC ACCESS RIGHT 10/22/2015 [facility]INTER[facility] RAD   IR GENERIC HISTORICAL  10/22/2015   IR FLUORO GUIDE CV LINE RIGHT 10/22/2015 [facility]   IR GENERIC HISTORICAL  10/23/2015   IR FLUORO GUIDE CV LINE RIGHT 10/23/2015 Marybelle Killings, MD [facility]INTER[facility] RAD   LEFT HEART CATHETERIZATION WITH CORONARY ANGIOGRAM N/A 07/13/2013    Procedure: LEFT HEART CATHETERIZATION WITH CORONARY ANGIOGRAM;  Surgeon: Jettie Booze, MD;  Location: [facility] CATH LAB;  Service: Cardiovascular;  Laterality: N/A;   LIGATION OF ARTERIOVENOUS  FISTULA Left 12/22/2012   Procedure: LIGATION OF ARTERIOVENOUS  FISTULA;EXCISION OF LARGE ANEURYSMS;;  Surgeon: Elam Dutch, MD;  Location: [facility] OR;  Service: Vascular;  Laterality: Left;   UPPER GASTROINTESTINAL ENDOSCOPY      Current Medications: Current Meds  Medication Sig   acetaminophen (TYLENOL) 650 MG CR tablet Take 1 tablet (650 mg total) by mouth daily as needed for pain.   amLODipine (NORVASC) 10 MG tablet TAKE 1 TABLET(10 MG) BY MOUTH DAILY   chlorproMAZINE (THORAZINE) 10 MG tablet Take 1 tablet (10 mg total) by mouth 3 (three) times daily as needed for hiccups.   cloNIDine (CATAPRES - DOSED IN MG/24 HR) 0.3 mg/24hr patch Place 0.3 mg onto the skin every Sunday.   Doxercalciferol (HECTOROL IV) Dialysis Monday,Wednesday,friday   hydrALAZINE (APRESOLINE) 25 MG tablet Take 1 tablet (25 mg total) by mouth 2 (two) times daily.   hydrOXYzine (ATARAX/VISTARIL) 25 MG tablet Take 1 tablet (25 mg total) by mouth every 8 (eight) hours as needed for itching. Further refills need to come from your PCP.   Ipratropium-Albuterol (COMBIVENT) 20-100 MCG/ACT AERS respimat Inhale 1 puff into the lungs every 6 (six) hours as needed for wheezing.   Methoxy PEG-Epoetin Beta (MIRCERA IJ) Dialysis Monday,Wednesday,friday   pantoprazole (PROTONIX) 40 MG tablet Take 1 tablet (40 mg total) by mouth 2 (two) times daily before a meal.   predniSONE (DELTASONE) 50 MG tablet take 1 tablet by mouth daily with breakfast   sevelamer carbonate (RENVELA) 800 MG tablet Take 1,600-3,200 mg by mouth See admin instructions. 2-4 tabs with meals depending on meal size   tamsulosin (FLOMAX) 0.4 MG CAPS capsule TAKE 2 CAPSULES(0.8 MG) BY MOUTH DAILY   umeclidinium-vilanterol (ANORO ELLIPTA) 62.5-25 MCG/INH AEPB Inhale 1 puff into the  lungs daily.   [DISCONTINUED] isosorbide dinitrate (ISORDIL) 20 MG tablet TAKE 1 TABLET(20 MG) BY MOUTH TWICE DAILY   [DISCONTINUED] metoprolol tartrate (LOPRESSOR) 100 MG tablet Take 1 tablet by mouth twice a day.  ON DIALYSIS DAYS, TAKE AFTER DIALYSIS. (Patient taking differently: Take 1 tablet by mouth twice a day.  ON DIALYSIS DAYS, TAKE AFTER DIALYSIS.)     Allergies:   Venofer  [ferric oxide] and Aspirin   Social History   Socioeconomic History   Marital status: Single    Spouse name: Not on file   Number of children: 0   Years of education: 12th   Highest education level: High school graduate  Occupational History  Occupation: n/a  Tobacco Use   Smoking status: Former    Packs/day: 0.50    Years: 16.00    Pack years: 8.00    Types: Cigarettes    Quit date: 07/05/2019    Years since quitting: 1.3   Smokeless tobacco: Never  Vaping Use   Vaping Use: Never used  Substance and Sexual Activity   Alcohol use: Yes    Alcohol/week: 2.0 standard drinks    Types: 2 Shots of liquor per week   Drug use: Yes    Frequency: 2.0 times per week    Types: Marijuana    Comment: last used 11-28-18   Sexual activity: Not Currently  Other Topics Concern   Not on file  Social History Narrative   Patient lives alone in [place].    Patient has never been married and does not have any children.    Patients support system, his mother, pass away 2016.   Patient does not own his own vehicle, uses public transportation with no concerns at this time.   Social Determinants of Health   Financial Resource Strain: Low Risk    Difficulty of Paying Living Expenses: Not very hard  Food Insecurity: No Food Insecurity   Worried About Charity fundraiser in the Last Year: Never true   Ran Out of Food in the Last Year: Never true  Transportation Needs: No Transportation Needs   Lack of Transportation (Medical): No   Lack of Transportation (Non-Medical): No  Physical Activity: Inactive   Days  of Exercise per Week: 0 days   Minutes of Exercise per Session: 0 min  Stress: No Stress Concern Present   Feeling of Stress : Only a little  Social Connections: Socially Isolated   Frequency of Communication with Friends and Family: Twice a week   Frequency of Social Gatherings with Friends and Family: Twice a week   Attends Religious Services: Never   Marine scientist or Organizations: No   Attends Music therapist: Never   Marital Status: Never married     Family History: The patient's family history includes Asthma in his brother; Cerebrovascular Accident in his father; Congestive Heart Failure in his brother; Hypertension in his father and mother. There is no history of Stomach cancer, Rectal cancer, Esophageal cancer, or Colon cancer.  ROS:   Please see the history of present illness.     All other systems reviewed and are negative.  EKGs/Labs/Other Studies Reviewed:    The following studies were reviewed today:  EKG:  EKG is  ordered today.  The ekg ordered today demonstrates  08/28/20: SR 1st HB  Cardiac Event Monitoring: Date: 08/01/2019 Results: Patient had a min HR of 53 bpm, max HR of 148 bpm, and avg HR of 62 BPM. 7 Supraventricular Tachycardia runs occurred, the longest lasting 18 beats with an avg rate of 112 bpm.   Predominant underlying rhythm was Sinus Rhythm. Few very short runs of SVTs that don't correlate with patient's symptoms. Rare PVCs, PACs, 1 blocked sinus beat.   Continue the same management.    Transthoracic Echocardiogram: Date:09/13/2020 Results: 1. Mildly elevated gradient (peak velocity 2.3 m/s; mean gradient 10  mmHg) across aortic valve but visually opens normally.   2. Left ventricular ejection fraction, by estimation, is 55 to 60%. The  left ventricle has normal function. The left ventricle has no regional  wall motion abnormalities. There is moderate left ventricular hypertrophy.  Left ventricular diastolic   parameters are consistent  with Grade I diastolic dysfunction (impaired  relaxation). Elevated left atrial pressure.   3. Right ventricular systolic function is normal. The right ventricular  size is normal. There is mildly elevated pulmonary artery systolic  pressure.   4. Left atrial size was severely dilated.   5. Venous catheter noted in right atrium.   6. The mitral valve is normal in structure. Mild mitral valve  regurgitation. No evidence of mitral stenosis.   7. The aortic valve is tricuspid. Aortic valve regurgitation is not  visualized. Mild aortic valve sclerosis is present, with no evidence of  aortic valve stenosis.   8. The inferior vena cava is normal in size with greater than 50%  respiratory variability, suggesting right atrial pressure of 3 mmHg.   Cardiac CT: Date:03/11/2017 Results: Aortic Atherosclerosis 3VC CAC and proximal RCA calcification  Recent Labs: 08/08/2020: ALT 9 08/10/2020: BUN 49; Creatinine, Ser 12.54; Hemoglobin 8.2; Magnesium 2.1; Platelets 268; Potassium 4.6; Sodium 133  Recent Lipid Panel No results found for: CHOL, TRIG, HDL, CHOLHDL, VLDL, LDLCALC, LDLDIRECT   Physical Exam:    VS:  BP 122/74   Pulse 65   Ht 5' 5"  (1.651 m)   Wt 178 lb 6.4 oz (80.9 kg)   SpO2 95%   BMI 29.69 kg/m     Wt Readings from Last 3 Encounters:  11/01/20 178 lb 6.4 oz (80.9 kg)  09/05/20 176 lb 12.8 oz (80.2 kg)  08/28/20 178 lb (80.7 kg)     GEN: Well nourished, well developed in no acute distress HEENT: Normal NECK: No JVD LYMPHATICS: No lymphadenopathy CARDIAC: RRR, no murmurs, rubs, gallops; L/R arm Fistulas have no bruit/thrrill RESPIRATORY:  Clear to auscultation without rales, wheezing or rhonchi  ABDOMEN: Soft, non-tender, non-distended MUSCULOSKELETAL:  No edema; No deformity  SKIN: Warm and dry NEUROLOGIC:  Alert and oriented x 3 PSYCHIATRIC:  Normal affect   ASSESSMENT:    1. Nonischemic cardiomyopathy ([place])   2. Essential hypertension    3. Hypertensive heart disease with heart failure ([place])   4. Microcytic anemia   5. Heart failure with reduced ejection fraction ([place])   6. ESRD on dialysis ([place])   7. Anemia of renal disease    PLAN:    Heart Failure Recovered Ejection Fraction  PH NOS COPD and active tobacco use (discussed cessation) ESRD HTN & HLD   Microcytic anemia - NYHA class I, Stage C, euvolemic, etiology unclear - Diuretic regimen: per HD with Dr. Edrick Oh - continue norvasc, clonidine per primary and nephrology - hydralazine at 25 mg PO BID and isorbide dinitrate  BID 20 mg ; return metoprolol to BID fw (so he can take after HD on dialysis) - will recheck his CBC and iron studies; has stopped PO iron supplementation in the past because of constipation; if needing iron will also need laxative  Will plan for six months follow up unless new symptoms or abnormal test results warranting change in plan  Would be reasonable for  APP Follow up   Medication Adjustments/Labs and Tests Ordered: Current medicines are reviewed at length with the patient today.  Concerns regarding medicines are outlined above.  Orders Placed This Encounter  Procedures   Vitamin B12   Folate   Iron and TIBC   Ferritin   CBC     Meds ordered this encounter  Medications   metoprolol tartrate (LOPRESSOR) 100 MG tablet    Sig: Take 1 tablet by mouth twice a day.  ON DIALYSIS DAYS, TAKE AFTER  DIALYSIS.    Dispense:  180 tablet    Refill:  3   isosorbide dinitrate (ISORDIL) 20 MG tablet    Sig: TAKE 1 TABLET(20 MG) BY MOUTH TWICE DAILY    Dispense:  180 tablet    Refill:  3     Patient Instructions  Medication Instructions:   Your physician recommends that you continue on your current medications as directed. Please refer to the Current Medication list given to you today.  *If you need a refill on your cardiac medications before your next appointment, please call your pharmacy*   Lab Work:  TODAY--CBC AND AN  ANEMIA PANEL  If you have labs (blood work) drawn today and your tests are completely normal, you will receive your results only by: [place] (if you have MyChart) OR A paper copy in the mail If you have any lab test that is abnormal or we need to change your treatment, we will call you to review the results.   Follow-Up: At [facility], you and your health needs are our priority.  As part of our continuing mission to provide you with exceptional heart care, we have created designated Provider Care Teams.  These Care Teams include your primary Cardiologist (physician) and Advanced Practice Providers (APPs -  Physician Assistants and Nurse Practitioners) who all work together to provide you with the care you need, when you need it.  We recommend signing up for the patient portal called "MyChart".  Sign up information is provided on this After Visit Summary.  MyChart is used to connect with patients for Virtual Visits (Telemedicine).  Patients are able to view lab/test results, encounter notes, upcoming appointments, etc.  Non-urgent messages can be sent to your provider as well.   To learn more about what you can do with MyChart, go to [email or web address].    Your next appointment:   6 month(s)  The format for your next appointment:   In Person  Provider:   Rudean Haskell, MD      Signed, Werner Lean, MD  11/01/2020 9:15 AM    [place]

## 2020-11-01 NOTE — Patient Instructions (Signed)
Medication Instructions:   Your physician recommends that you continue on your current medications as directed. Please refer to the Current Medication list given to you today.  *If you need a refill on your cardiac medications before your next appointment, please call your pharmacy*   Lab Work:  TODAY--CBC AND AN ANEMIA PANEL  If you have labs (blood work) drawn today and your tests are completely normal, you will receive your results only by: [place] (if you have MyChart) OR A paper copy in the mail If you have any lab test that is abnormal or we need to change your treatment, we will call you to review the results.   Follow-Up: At [facility], you and your health needs are our priority.  As part of our continuing mission to provide you with exceptional heart care, we have created designated Provider Care Teams.  These Care Teams include your primary Cardiologist (physician) and Advanced Practice Providers (APPs -  Physician Assistants and Nurse Practitioners) who all work together to provide you with the care you need, when you need it.  We recommend signing up for the patient portal called "MyChart".  Sign up information is provided on this After Visit Summary.  MyChart is used to connect with patients for Virtual Visits (Telemedicine).  Patients are able to view lab/test results, encounter notes, upcoming appointments, etc.  Non-urgent messages can be sent to your provider as well.   To learn more about what you can do with MyChart, go to [email or web address].    Your next appointment:   6 month(s)  The format for your next appointment:   In Person  Provider:   Rudean Haskell, MD

## 2020-11-02 DIAGNOSIS — Z992 Dependence on renal dialysis: Secondary | ICD-10-CM | POA: Diagnosis not present

## 2020-11-02 DIAGNOSIS — J158 Pneumonia due to other specified bacteria: Secondary | ICD-10-CM | POA: Diagnosis not present

## 2020-11-02 DIAGNOSIS — D509 Iron deficiency anemia, unspecified: Secondary | ICD-10-CM | POA: Diagnosis not present

## 2020-11-02 DIAGNOSIS — N186 End stage renal disease: Secondary | ICD-10-CM | POA: Diagnosis not present

## 2020-11-02 DIAGNOSIS — D631 Anemia in chronic kidney disease: Secondary | ICD-10-CM | POA: Diagnosis not present

## 2020-11-02 DIAGNOSIS — N2581 Secondary hyperparathyroidism of renal origin: Secondary | ICD-10-CM | POA: Diagnosis not present

## 2020-11-02 LAB — CBC
Hematocrit: 26.5 % — ABNORMAL LOW (ref 37.5–51.0)
Hemoglobin: 7.6 g/dL — ABNORMAL LOW (ref 13.0–17.7)
MCH: 21.1 pg — ABNORMAL LOW (ref 26.6–33.0)
MCHC: 28.7 g/dL — ABNORMAL LOW (ref 31.5–35.7)
MCV: 74 fL — ABNORMAL LOW (ref 79–97)
Platelets: 362 10*3/uL (ref 150–450)
RBC: 3.6 x10E6/uL — ABNORMAL LOW (ref 4.14–5.80)
RDW: 16.8 % — ABNORMAL HIGH (ref 11.6–15.4)
WBC: 7.8 10*3/uL (ref 3.4–10.8)

## 2020-11-02 LAB — FERRITIN: Ferritin: 76 ng/mL (ref 30–400)

## 2020-11-02 LAB — IRON AND TIBC
Iron Saturation: 6 % — CL (ref 15–55)
Iron: 14 ug/dL — ABNORMAL LOW (ref 38–169)
Total Iron Binding Capacity: 254 ug/dL (ref 250–450)
UIBC: 240 ug/dL (ref 111–343)

## 2020-11-02 LAB — FOLATE: Folate: 4.1 ng/mL (ref >3.0)

## 2020-11-02 LAB — VITAMIN B12: Vitamin B-12: 425 pg/mL (ref 232–1245)

## 2020-11-03 DIAGNOSIS — I12 Hypertensive chronic kidney disease with 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: Secondary | ICD-10-CM | POA: Diagnosis not present

## 2020-11-03 DIAGNOSIS — Z992 Dependence on renal dialysis: Secondary | ICD-10-CM | POA: Diagnosis not present

## 2020-11-03 DIAGNOSIS — N186 End stage renal disease: Secondary | ICD-10-CM | POA: Diagnosis not present

## 2020-11-05 DIAGNOSIS — J158 Pneumonia due to other specified bacteria: Secondary | ICD-10-CM | POA: Diagnosis not present

## 2020-11-05 DIAGNOSIS — N186 End stage renal disease: Secondary | ICD-10-CM | POA: Diagnosis not present

## 2020-11-05 DIAGNOSIS — D509 Iron deficiency anemia, unspecified: Secondary | ICD-10-CM | POA: Diagnosis not present

## 2020-11-05 DIAGNOSIS — N2581 Secondary hyperparathyroidism of renal origin: Secondary | ICD-10-CM | POA: Diagnosis not present

## 2020-11-05 DIAGNOSIS — Z992 Dependence on renal dialysis: Secondary | ICD-10-CM | POA: Diagnosis not present

## 2020-11-05 DIAGNOSIS — Z23 Encounter for immunization: Secondary | ICD-10-CM | POA: Diagnosis not present

## 2020-11-05 DIAGNOSIS — D631 Anemia in chronic kidney disease: Secondary | ICD-10-CM | POA: Diagnosis not present

## 2020-11-07 ENCOUNTER — Telehealth: Payer: Self-pay

## 2020-11-07 DIAGNOSIS — N186 End stage renal disease: Secondary | ICD-10-CM | POA: Diagnosis not present

## 2020-11-07 DIAGNOSIS — Z992 Dependence on renal dialysis: Secondary | ICD-10-CM | POA: Diagnosis not present

## 2020-11-07 DIAGNOSIS — D631 Anemia in chronic kidney disease: Secondary | ICD-10-CM | POA: Diagnosis not present

## 2020-11-07 DIAGNOSIS — N2581 Secondary hyperparathyroidism of renal origin: Secondary | ICD-10-CM | POA: Diagnosis not present

## 2020-11-07 DIAGNOSIS — J158 Pneumonia due to other specified bacteria: Secondary | ICD-10-CM | POA: Diagnosis not present

## 2020-11-07 DIAGNOSIS — D509 Iron deficiency anemia, unspecified: Secondary | ICD-10-CM | POA: Diagnosis not present

## 2020-11-07 MED ORDER — IRON (FERROUS SULFATE) 325 (65 FE) MG PO TABS: 325.0000 mg | ORAL_TABLET | Freq: Every day | ORAL | 3 refills | Status: DC

## 2020-11-07 MED ORDER — SENNA 8.6 MG PO TABS: 1.0000 | ORAL_TABLET | Freq: Every day | ORAL | 3 refills | Status: DC

## 2020-11-07 NOTE — Telephone Encounter (Signed)
I have called pt reviewed results and MD recommendations.  Pt strongly expressed that he will not take ferrous sulfate.  I reviewed lab results with him a 2nd time.  He is agree to take medication once a day.  I advised him to take Senna daily to avoid constipation.  He expressed that he will try but is not optimist.  Orders placed.  Pt is aware medication is OTC.

## 2020-11-07 NOTE — Telephone Encounter (Signed)
-----   Message from Werner Lean, MD sent at 11/02/2020  2:49 PM EDT ----- Results: IDA Plan: Will return patients Ferrous Sullfate 325 mg PO BID Will also add Senna with this (patient stopped iron in the past because of constipation) Will cc' PCP for f/u  Werner Lean, MD

## 2020-11-09 DIAGNOSIS — J158 Pneumonia due to other specified bacteria: Secondary | ICD-10-CM | POA: Diagnosis not present

## 2020-11-09 DIAGNOSIS — N186 End stage renal disease: Secondary | ICD-10-CM | POA: Diagnosis not present

## 2020-11-09 DIAGNOSIS — D509 Iron deficiency anemia, unspecified: Secondary | ICD-10-CM | POA: Diagnosis not present

## 2020-11-09 DIAGNOSIS — Z992 Dependence on renal dialysis: Secondary | ICD-10-CM | POA: Diagnosis not present

## 2020-11-09 DIAGNOSIS — N2581 Secondary hyperparathyroidism of renal origin: Secondary | ICD-10-CM | POA: Diagnosis not present

## 2020-11-09 DIAGNOSIS — D631 Anemia in chronic kidney disease: Secondary | ICD-10-CM | POA: Diagnosis not present

## 2020-11-12 DIAGNOSIS — D631 Anemia in chronic kidney disease: Secondary | ICD-10-CM | POA: Diagnosis not present

## 2020-11-12 DIAGNOSIS — N2581 Secondary hyperparathyroidism of renal origin: Secondary | ICD-10-CM | POA: Diagnosis not present

## 2020-11-12 DIAGNOSIS — D509 Iron deficiency anemia, unspecified: Secondary | ICD-10-CM | POA: Diagnosis not present

## 2020-11-12 DIAGNOSIS — Z992 Dependence on renal dialysis: Secondary | ICD-10-CM | POA: Diagnosis not present

## 2020-11-12 DIAGNOSIS — N186 End stage renal disease: Secondary | ICD-10-CM | POA: Diagnosis not present

## 2020-11-12 DIAGNOSIS — J158 Pneumonia due to other specified bacteria: Secondary | ICD-10-CM | POA: Diagnosis not present

## 2020-11-14 DIAGNOSIS — D509 Iron deficiency anemia, unspecified: Secondary | ICD-10-CM | POA: Diagnosis not present

## 2020-11-14 DIAGNOSIS — N186 End stage renal disease: Secondary | ICD-10-CM | POA: Diagnosis not present

## 2020-11-14 DIAGNOSIS — Z992 Dependence on renal dialysis: Secondary | ICD-10-CM | POA: Diagnosis not present

## 2020-11-14 DIAGNOSIS — N2581 Secondary hyperparathyroidism of renal origin: Secondary | ICD-10-CM | POA: Diagnosis not present

## 2020-11-14 DIAGNOSIS — J158 Pneumonia due to other specified bacteria: Secondary | ICD-10-CM | POA: Diagnosis not present

## 2020-11-14 DIAGNOSIS — D631 Anemia in chronic kidney disease: Secondary | ICD-10-CM | POA: Diagnosis not present

## 2020-11-16 DIAGNOSIS — J158 Pneumonia due to other specified bacteria: Secondary | ICD-10-CM | POA: Diagnosis not present

## 2020-11-16 DIAGNOSIS — N186 End stage renal disease: Secondary | ICD-10-CM | POA: Diagnosis not present

## 2020-11-16 DIAGNOSIS — D631 Anemia in chronic kidney disease: Secondary | ICD-10-CM | POA: Diagnosis not present

## 2020-11-16 DIAGNOSIS — D509 Iron deficiency anemia, unspecified: Secondary | ICD-10-CM | POA: Diagnosis not present

## 2020-11-16 DIAGNOSIS — Z992 Dependence on renal dialysis: Secondary | ICD-10-CM | POA: Diagnosis not present

## 2020-11-16 DIAGNOSIS — N2581 Secondary hyperparathyroidism of renal origin: Secondary | ICD-10-CM | POA: Diagnosis not present

## 2020-11-19 DIAGNOSIS — J158 Pneumonia due to other specified bacteria: Secondary | ICD-10-CM | POA: Diagnosis not present

## 2020-11-19 DIAGNOSIS — D631 Anemia in chronic kidney disease: Secondary | ICD-10-CM | POA: Diagnosis not present

## 2020-11-19 DIAGNOSIS — D509 Iron deficiency anemia, unspecified: Secondary | ICD-10-CM | POA: Diagnosis not present

## 2020-11-19 DIAGNOSIS — N2581 Secondary hyperparathyroidism of renal origin: Secondary | ICD-10-CM | POA: Diagnosis not present

## 2020-11-19 DIAGNOSIS — Z992 Dependence on renal dialysis: Secondary | ICD-10-CM | POA: Diagnosis not present

## 2020-11-19 DIAGNOSIS — N186 End stage renal disease: Secondary | ICD-10-CM | POA: Diagnosis not present

## 2020-11-21 DIAGNOSIS — Z992 Dependence on renal dialysis: Secondary | ICD-10-CM | POA: Diagnosis not present

## 2020-11-21 DIAGNOSIS — J158 Pneumonia due to other specified bacteria: Secondary | ICD-10-CM | POA: Diagnosis not present

## 2020-11-21 DIAGNOSIS — N2581 Secondary hyperparathyroidism of renal origin: Secondary | ICD-10-CM | POA: Diagnosis not present

## 2020-11-21 DIAGNOSIS — D509 Iron deficiency anemia, unspecified: Secondary | ICD-10-CM | POA: Diagnosis not present

## 2020-11-21 DIAGNOSIS — D631 Anemia in chronic kidney disease: Secondary | ICD-10-CM | POA: Diagnosis not present

## 2020-11-21 DIAGNOSIS — N186 End stage renal disease: Secondary | ICD-10-CM | POA: Diagnosis not present

## 2020-11-23 DIAGNOSIS — D509 Iron deficiency anemia, unspecified: Secondary | ICD-10-CM | POA: Diagnosis not present

## 2020-11-23 DIAGNOSIS — D631 Anemia in chronic kidney disease: Secondary | ICD-10-CM | POA: Diagnosis not present

## 2020-11-23 DIAGNOSIS — N2581 Secondary hyperparathyroidism of renal origin: Secondary | ICD-10-CM | POA: Diagnosis not present

## 2020-11-23 DIAGNOSIS — N186 End stage renal disease: Secondary | ICD-10-CM | POA: Diagnosis not present

## 2020-11-23 DIAGNOSIS — J158 Pneumonia due to other specified bacteria: Secondary | ICD-10-CM | POA: Diagnosis not present

## 2020-11-23 DIAGNOSIS — Z992 Dependence on renal dialysis: Secondary | ICD-10-CM | POA: Diagnosis not present

## 2020-11-26 DIAGNOSIS — N186 End stage renal disease: Secondary | ICD-10-CM | POA: Diagnosis not present

## 2020-11-26 DIAGNOSIS — Z992 Dependence on renal dialysis: Secondary | ICD-10-CM | POA: Diagnosis not present

## 2020-11-26 DIAGNOSIS — D509 Iron deficiency anemia, unspecified: Secondary | ICD-10-CM | POA: Diagnosis not present

## 2020-11-26 DIAGNOSIS — N2581 Secondary hyperparathyroidism of renal origin: Secondary | ICD-10-CM | POA: Diagnosis not present

## 2020-11-26 DIAGNOSIS — J158 Pneumonia due to other specified bacteria: Secondary | ICD-10-CM | POA: Diagnosis not present

## 2020-11-26 DIAGNOSIS — D631 Anemia in chronic kidney disease: Secondary | ICD-10-CM | POA: Diagnosis not present

## 2020-11-28 DIAGNOSIS — N2581 Secondary hyperparathyroidism of renal origin: Secondary | ICD-10-CM | POA: Diagnosis not present

## 2020-11-28 DIAGNOSIS — J158 Pneumonia due to other specified bacteria: Secondary | ICD-10-CM | POA: Diagnosis not present

## 2020-11-28 DIAGNOSIS — D631 Anemia in chronic kidney disease: Secondary | ICD-10-CM | POA: Diagnosis not present

## 2020-11-28 DIAGNOSIS — Z992 Dependence on renal dialysis: Secondary | ICD-10-CM | POA: Diagnosis not present

## 2020-11-28 DIAGNOSIS — D509 Iron deficiency anemia, unspecified: Secondary | ICD-10-CM | POA: Diagnosis not present

## 2020-11-28 DIAGNOSIS — N186 End stage renal disease: Secondary | ICD-10-CM | POA: Diagnosis not present

## 2020-11-30 DIAGNOSIS — J158 Pneumonia due to other specified bacteria: Secondary | ICD-10-CM | POA: Diagnosis not present

## 2020-11-30 DIAGNOSIS — N186 End stage renal disease: Secondary | ICD-10-CM | POA: Diagnosis not present

## 2020-11-30 DIAGNOSIS — D631 Anemia in chronic kidney disease: Secondary | ICD-10-CM | POA: Diagnosis not present

## 2020-11-30 DIAGNOSIS — D509 Iron deficiency anemia, unspecified: Secondary | ICD-10-CM | POA: Diagnosis not present

## 2020-11-30 DIAGNOSIS — Z992 Dependence on renal dialysis: Secondary | ICD-10-CM | POA: Diagnosis not present

## 2020-11-30 DIAGNOSIS — N2581 Secondary hyperparathyroidism of renal origin: Secondary | ICD-10-CM | POA: Diagnosis not present

## 2020-12-03 DIAGNOSIS — D631 Anemia in chronic kidney disease: Secondary | ICD-10-CM | POA: Diagnosis not present

## 2020-12-03 DIAGNOSIS — J158 Pneumonia due to other specified bacteria: Secondary | ICD-10-CM | POA: Diagnosis not present

## 2020-12-03 DIAGNOSIS — N2581 Secondary hyperparathyroidism of renal origin: Secondary | ICD-10-CM | POA: Diagnosis not present

## 2020-12-03 DIAGNOSIS — N186 End stage renal disease: Secondary | ICD-10-CM | POA: Diagnosis not present

## 2020-12-03 DIAGNOSIS — Z992 Dependence on renal dialysis: Secondary | ICD-10-CM | POA: Diagnosis not present

## 2020-12-03 DIAGNOSIS — D509 Iron deficiency anemia, unspecified: Secondary | ICD-10-CM | POA: Diagnosis not present

## 2020-12-04 DIAGNOSIS — Z992 Dependence on renal dialysis: Secondary | ICD-10-CM | POA: Diagnosis not present

## 2020-12-04 DIAGNOSIS — N186 End stage renal disease: Secondary | ICD-10-CM | POA: Diagnosis not present

## 2020-12-04 DIAGNOSIS — I12 Hypertensive chronic kidney disease with 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: Secondary | ICD-10-CM | POA: Diagnosis not present

## 2020-12-05 DIAGNOSIS — D509 Iron deficiency anemia, unspecified: Secondary | ICD-10-CM | POA: Diagnosis not present

## 2020-12-05 DIAGNOSIS — Z992 Dependence on renal dialysis: Secondary | ICD-10-CM | POA: Diagnosis not present

## 2020-12-05 DIAGNOSIS — D631 Anemia in chronic kidney disease: Secondary | ICD-10-CM | POA: Diagnosis not present

## 2020-12-05 DIAGNOSIS — N186 End stage renal disease: Secondary | ICD-10-CM | POA: Diagnosis not present

## 2020-12-05 DIAGNOSIS — J158 Pneumonia due to other specified bacteria: Secondary | ICD-10-CM | POA: Diagnosis not present

## 2020-12-05 DIAGNOSIS — N2581 Secondary hyperparathyroidism of renal origin: Secondary | ICD-10-CM | POA: Diagnosis not present

## 2020-12-06 DIAGNOSIS — Z125 Encounter for screening for malignant neoplasm of prostate: Secondary | ICD-10-CM | POA: Diagnosis not present

## 2020-12-06 DIAGNOSIS — N401 Enlarged prostate with lower urinary tract symptoms: Secondary | ICD-10-CM | POA: Diagnosis not present

## 2020-12-06 DIAGNOSIS — R3912 Poor urinary stream: Secondary | ICD-10-CM | POA: Diagnosis not present

## 2020-12-07 DIAGNOSIS — D631 Anemia in chronic kidney disease: Secondary | ICD-10-CM | POA: Diagnosis not present

## 2020-12-07 DIAGNOSIS — D509 Iron deficiency anemia, unspecified: Secondary | ICD-10-CM | POA: Diagnosis not present

## 2020-12-07 DIAGNOSIS — N186 End stage renal disease: Secondary | ICD-10-CM | POA: Diagnosis not present

## 2020-12-07 DIAGNOSIS — N2581 Secondary hyperparathyroidism of renal origin: Secondary | ICD-10-CM | POA: Diagnosis not present

## 2020-12-07 DIAGNOSIS — Z992 Dependence on renal dialysis: Secondary | ICD-10-CM | POA: Diagnosis not present

## 2020-12-07 DIAGNOSIS — J158 Pneumonia due to other specified bacteria: Secondary | ICD-10-CM | POA: Diagnosis not present

## 2020-12-10 DIAGNOSIS — J158 Pneumonia due to other specified bacteria: Secondary | ICD-10-CM | POA: Diagnosis not present

## 2020-12-10 DIAGNOSIS — D509 Iron deficiency anemia, unspecified: Secondary | ICD-10-CM | POA: Diagnosis not present

## 2020-12-10 DIAGNOSIS — D631 Anemia in chronic kidney disease: Secondary | ICD-10-CM | POA: Diagnosis not present

## 2020-12-10 DIAGNOSIS — N2581 Secondary hyperparathyroidism of renal origin: Secondary | ICD-10-CM | POA: Diagnosis not present

## 2020-12-10 DIAGNOSIS — Z992 Dependence on renal dialysis: Secondary | ICD-10-CM | POA: Diagnosis not present

## 2020-12-10 DIAGNOSIS — N186 End stage renal disease: Secondary | ICD-10-CM | POA: Diagnosis not present

## 2020-12-12 DIAGNOSIS — N186 End stage renal disease: Secondary | ICD-10-CM | POA: Diagnosis not present

## 2020-12-12 DIAGNOSIS — D631 Anemia in chronic kidney disease: Secondary | ICD-10-CM | POA: Diagnosis not present

## 2020-12-12 DIAGNOSIS — J158 Pneumonia due to other specified bacteria: Secondary | ICD-10-CM | POA: Diagnosis not present

## 2020-12-12 DIAGNOSIS — D509 Iron deficiency anemia, unspecified: Secondary | ICD-10-CM | POA: Diagnosis not present

## 2020-12-12 DIAGNOSIS — N2581 Secondary hyperparathyroidism of renal origin: Secondary | ICD-10-CM | POA: Diagnosis not present

## 2020-12-12 DIAGNOSIS — Z992 Dependence on renal dialysis: Secondary | ICD-10-CM | POA: Diagnosis not present

## 2020-12-14 DIAGNOSIS — D631 Anemia in chronic kidney disease: Secondary | ICD-10-CM | POA: Diagnosis not present

## 2020-12-14 DIAGNOSIS — Z992 Dependence on renal dialysis: Secondary | ICD-10-CM | POA: Diagnosis not present

## 2020-12-14 DIAGNOSIS — N186 End stage renal disease: Secondary | ICD-10-CM | POA: Diagnosis not present

## 2020-12-14 DIAGNOSIS — D509 Iron deficiency anemia, unspecified: Secondary | ICD-10-CM | POA: Diagnosis not present

## 2020-12-14 DIAGNOSIS — J158 Pneumonia due to other specified bacteria: Secondary | ICD-10-CM | POA: Diagnosis not present

## 2020-12-14 DIAGNOSIS — N2581 Secondary hyperparathyroidism of renal origin: Secondary | ICD-10-CM | POA: Diagnosis not present

## 2020-12-17 DIAGNOSIS — J158 Pneumonia due to other specified bacteria: Secondary | ICD-10-CM | POA: Diagnosis not present

## 2020-12-17 DIAGNOSIS — D631 Anemia in chronic kidney disease: Secondary | ICD-10-CM | POA: Diagnosis not present

## 2020-12-17 DIAGNOSIS — N2581 Secondary hyperparathyroidism of renal origin: Secondary | ICD-10-CM | POA: Diagnosis not present

## 2020-12-17 DIAGNOSIS — D509 Iron deficiency anemia, unspecified: Secondary | ICD-10-CM | POA: Diagnosis not present

## 2020-12-17 DIAGNOSIS — Z992 Dependence on renal dialysis: Secondary | ICD-10-CM | POA: Diagnosis not present

## 2020-12-17 DIAGNOSIS — N186 End stage renal disease: Secondary | ICD-10-CM | POA: Diagnosis not present

## 2020-12-19 DIAGNOSIS — N186 End stage renal disease: Secondary | ICD-10-CM | POA: Diagnosis not present

## 2020-12-19 DIAGNOSIS — N2581 Secondary hyperparathyroidism of renal origin: Secondary | ICD-10-CM | POA: Diagnosis not present

## 2020-12-19 DIAGNOSIS — D509 Iron deficiency anemia, unspecified: Secondary | ICD-10-CM | POA: Diagnosis not present

## 2020-12-19 DIAGNOSIS — J158 Pneumonia due to other specified bacteria: Secondary | ICD-10-CM | POA: Diagnosis not present

## 2020-12-19 DIAGNOSIS — Z992 Dependence on renal dialysis: Secondary | ICD-10-CM | POA: Diagnosis not present

## 2020-12-19 DIAGNOSIS — D631 Anemia in chronic kidney disease: Secondary | ICD-10-CM | POA: Diagnosis not present

## 2020-12-21 DIAGNOSIS — Z992 Dependence on renal dialysis: Secondary | ICD-10-CM | POA: Diagnosis not present

## 2020-12-21 DIAGNOSIS — D631 Anemia in chronic kidney disease: Secondary | ICD-10-CM | POA: Diagnosis not present

## 2020-12-21 DIAGNOSIS — N186 End stage renal disease: Secondary | ICD-10-CM | POA: Diagnosis not present

## 2020-12-21 DIAGNOSIS — D509 Iron deficiency anemia, unspecified: Secondary | ICD-10-CM | POA: Diagnosis not present

## 2020-12-21 DIAGNOSIS — N2581 Secondary hyperparathyroidism of renal origin: Secondary | ICD-10-CM | POA: Diagnosis not present

## 2020-12-21 DIAGNOSIS — J158 Pneumonia due to other specified bacteria: Secondary | ICD-10-CM | POA: Diagnosis not present

## 2020-12-24 DIAGNOSIS — D631 Anemia in chronic kidney disease: Secondary | ICD-10-CM | POA: Diagnosis not present

## 2020-12-24 DIAGNOSIS — N2581 Secondary hyperparathyroidism of renal origin: Secondary | ICD-10-CM | POA: Diagnosis not present

## 2020-12-24 DIAGNOSIS — D509 Iron deficiency anemia, unspecified: Secondary | ICD-10-CM | POA: Diagnosis not present

## 2020-12-24 DIAGNOSIS — J158 Pneumonia due to other specified bacteria: Secondary | ICD-10-CM | POA: Diagnosis not present

## 2020-12-24 DIAGNOSIS — Z992 Dependence on renal dialysis: Secondary | ICD-10-CM | POA: Diagnosis not present

## 2020-12-24 DIAGNOSIS — N186 End stage renal disease: Secondary | ICD-10-CM | POA: Diagnosis not present

## 2020-12-26 DIAGNOSIS — D631 Anemia in chronic kidney disease: Secondary | ICD-10-CM | POA: Diagnosis not present

## 2020-12-26 DIAGNOSIS — J158 Pneumonia due to other specified bacteria: Secondary | ICD-10-CM | POA: Diagnosis not present

## 2020-12-26 DIAGNOSIS — N186 End stage renal disease: Secondary | ICD-10-CM | POA: Diagnosis not present

## 2020-12-26 DIAGNOSIS — N2581 Secondary hyperparathyroidism of renal origin: Secondary | ICD-10-CM | POA: Diagnosis not present

## 2020-12-26 DIAGNOSIS — Z992 Dependence on renal dialysis: Secondary | ICD-10-CM | POA: Diagnosis not present

## 2020-12-26 DIAGNOSIS — D509 Iron deficiency anemia, unspecified: Secondary | ICD-10-CM | POA: Diagnosis not present

## 2020-12-29 DIAGNOSIS — Z992 Dependence on renal dialysis: Secondary | ICD-10-CM | POA: Diagnosis not present

## 2020-12-29 DIAGNOSIS — N2581 Secondary hyperparathyroidism of renal origin: Secondary | ICD-10-CM | POA: Diagnosis not present

## 2020-12-29 DIAGNOSIS — D509 Iron deficiency anemia, unspecified: Secondary | ICD-10-CM | POA: Diagnosis not present

## 2020-12-29 DIAGNOSIS — J158 Pneumonia due to other specified bacteria: Secondary | ICD-10-CM | POA: Diagnosis not present

## 2020-12-29 DIAGNOSIS — D631 Anemia in chronic kidney disease: Secondary | ICD-10-CM | POA: Diagnosis not present

## 2020-12-29 DIAGNOSIS — N186 End stage renal disease: Secondary | ICD-10-CM | POA: Diagnosis not present

## 2020-12-31 DIAGNOSIS — J158 Pneumonia due to other specified bacteria: Secondary | ICD-10-CM | POA: Diagnosis not present

## 2020-12-31 DIAGNOSIS — N2581 Secondary hyperparathyroidism of renal origin: Secondary | ICD-10-CM | POA: Diagnosis not present

## 2020-12-31 DIAGNOSIS — D509 Iron deficiency anemia, unspecified: Secondary | ICD-10-CM | POA: Diagnosis not present

## 2020-12-31 DIAGNOSIS — N186 End stage renal disease: Secondary | ICD-10-CM | POA: Diagnosis not present

## 2020-12-31 DIAGNOSIS — D631 Anemia in chronic kidney disease: Secondary | ICD-10-CM | POA: Diagnosis not present

## 2020-12-31 DIAGNOSIS — Z992 Dependence on renal dialysis: Secondary | ICD-10-CM | POA: Diagnosis not present

## 2021-01-01 DIAGNOSIS — R972 Elevated prostate specific antigen [PSA]: Secondary | ICD-10-CM | POA: Diagnosis not present

## 2021-01-02 DIAGNOSIS — J158 Pneumonia due to other specified bacteria: Secondary | ICD-10-CM | POA: Diagnosis not present

## 2021-01-02 DIAGNOSIS — N2581 Secondary hyperparathyroidism of renal origin: Secondary | ICD-10-CM | POA: Diagnosis not present

## 2021-01-02 DIAGNOSIS — D509 Iron deficiency anemia, unspecified: Secondary | ICD-10-CM | POA: Diagnosis not present

## 2021-01-02 DIAGNOSIS — N186 End stage renal disease: Secondary | ICD-10-CM | POA: Diagnosis not present

## 2021-01-02 DIAGNOSIS — D631 Anemia in chronic kidney disease: Secondary | ICD-10-CM | POA: Diagnosis not present

## 2021-01-02 DIAGNOSIS — Z992 Dependence on renal dialysis: Secondary | ICD-10-CM | POA: Diagnosis not present

## 2021-01-03 DIAGNOSIS — N186 End stage renal disease: Secondary | ICD-10-CM | POA: Diagnosis not present

## 2021-01-03 DIAGNOSIS — I12 Hypertensive chronic kidney disease with 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: Secondary | ICD-10-CM | POA: Diagnosis not present

## 2021-01-03 DIAGNOSIS — Z992 Dependence on renal dialysis: Secondary | ICD-10-CM | POA: Diagnosis not present

## 2021-01-04 DIAGNOSIS — Z992 Dependence on renal dialysis: Secondary | ICD-10-CM | POA: Diagnosis not present

## 2021-01-04 DIAGNOSIS — J158 Pneumonia due to other specified bacteria: Secondary | ICD-10-CM | POA: Diagnosis not present

## 2021-01-04 DIAGNOSIS — D631 Anemia in chronic kidney disease: Secondary | ICD-10-CM | POA: Diagnosis not present

## 2021-01-04 DIAGNOSIS — N2581 Secondary hyperparathyroidism of renal origin: Secondary | ICD-10-CM | POA: Diagnosis not present

## 2021-01-04 DIAGNOSIS — N186 End stage renal disease: Secondary | ICD-10-CM | POA: Diagnosis not present

## 2021-01-04 DIAGNOSIS — D509 Iron deficiency anemia, unspecified: Secondary | ICD-10-CM | POA: Diagnosis not present

## 2021-01-07 DIAGNOSIS — Z992 Dependence on renal dialysis: Secondary | ICD-10-CM | POA: Diagnosis not present

## 2021-01-07 DIAGNOSIS — N186 End stage renal disease: Secondary | ICD-10-CM | POA: Diagnosis not present

## 2021-01-07 DIAGNOSIS — D509 Iron deficiency anemia, unspecified: Secondary | ICD-10-CM | POA: Diagnosis not present

## 2021-01-07 DIAGNOSIS — D631 Anemia in chronic kidney disease: Secondary | ICD-10-CM | POA: Diagnosis not present

## 2021-01-07 DIAGNOSIS — N2581 Secondary hyperparathyroidism of renal origin: Secondary | ICD-10-CM | POA: Diagnosis not present

## 2021-01-07 DIAGNOSIS — J158 Pneumonia due to other specified bacteria: Secondary | ICD-10-CM | POA: Diagnosis not present

## 2021-01-09 DIAGNOSIS — N186 End stage renal disease: Secondary | ICD-10-CM | POA: Diagnosis not present

## 2021-01-09 DIAGNOSIS — J158 Pneumonia due to other specified bacteria: Secondary | ICD-10-CM | POA: Diagnosis not present

## 2021-01-09 DIAGNOSIS — D509 Iron deficiency anemia, unspecified: Secondary | ICD-10-CM | POA: Diagnosis not present

## 2021-01-09 DIAGNOSIS — N2581 Secondary hyperparathyroidism of renal origin: Secondary | ICD-10-CM | POA: Diagnosis not present

## 2021-01-09 DIAGNOSIS — D631 Anemia in chronic kidney disease: Secondary | ICD-10-CM | POA: Diagnosis not present

## 2021-01-09 DIAGNOSIS — Z992 Dependence on renal dialysis: Secondary | ICD-10-CM | POA: Diagnosis not present

## 2021-01-11 DIAGNOSIS — N186 End stage renal disease: Secondary | ICD-10-CM | POA: Diagnosis not present

## 2021-01-11 DIAGNOSIS — N2581 Secondary hyperparathyroidism of renal origin: Secondary | ICD-10-CM | POA: Diagnosis not present

## 2021-01-11 DIAGNOSIS — D509 Iron deficiency anemia, unspecified: Secondary | ICD-10-CM | POA: Diagnosis not present

## 2021-01-11 DIAGNOSIS — Z992 Dependence on renal dialysis: Secondary | ICD-10-CM | POA: Diagnosis not present

## 2021-01-11 DIAGNOSIS — D631 Anemia in chronic kidney disease: Secondary | ICD-10-CM | POA: Diagnosis not present

## 2021-01-11 DIAGNOSIS — J158 Pneumonia due to other specified bacteria: Secondary | ICD-10-CM | POA: Diagnosis not present

## 2021-01-14 DIAGNOSIS — D631 Anemia in chronic kidney disease: Secondary | ICD-10-CM | POA: Diagnosis not present

## 2021-01-14 DIAGNOSIS — J158 Pneumonia due to other specified bacteria: Secondary | ICD-10-CM | POA: Diagnosis not present

## 2021-01-14 DIAGNOSIS — D509 Iron deficiency anemia, unspecified: Secondary | ICD-10-CM | POA: Diagnosis not present

## 2021-01-14 DIAGNOSIS — N2581 Secondary hyperparathyroidism of renal origin: Secondary | ICD-10-CM | POA: Diagnosis not present

## 2021-01-14 DIAGNOSIS — N186 End stage renal disease: Secondary | ICD-10-CM | POA: Diagnosis not present

## 2021-01-14 DIAGNOSIS — Z992 Dependence on renal dialysis: Secondary | ICD-10-CM | POA: Diagnosis not present

## 2021-01-16 DIAGNOSIS — N2581 Secondary hyperparathyroidism of renal origin: Secondary | ICD-10-CM | POA: Diagnosis not present

## 2021-01-16 DIAGNOSIS — N186 End stage renal disease: Secondary | ICD-10-CM | POA: Diagnosis not present

## 2021-01-16 DIAGNOSIS — J158 Pneumonia due to other specified bacteria: Secondary | ICD-10-CM | POA: Diagnosis not present

## 2021-01-16 DIAGNOSIS — Z992 Dependence on renal dialysis: Secondary | ICD-10-CM | POA: Diagnosis not present

## 2021-01-16 DIAGNOSIS — D631 Anemia in chronic kidney disease: Secondary | ICD-10-CM | POA: Diagnosis not present

## 2021-01-16 DIAGNOSIS — D509 Iron deficiency anemia, unspecified: Secondary | ICD-10-CM | POA: Diagnosis not present

## 2021-01-18 DIAGNOSIS — J158 Pneumonia due to other specified bacteria: Secondary | ICD-10-CM | POA: Diagnosis not present

## 2021-01-18 DIAGNOSIS — Z992 Dependence on renal dialysis: Secondary | ICD-10-CM | POA: Diagnosis not present

## 2021-01-18 DIAGNOSIS — N2581 Secondary hyperparathyroidism of renal origin: Secondary | ICD-10-CM | POA: Diagnosis not present

## 2021-01-18 DIAGNOSIS — D631 Anemia in chronic kidney disease: Secondary | ICD-10-CM | POA: Diagnosis not present

## 2021-01-18 DIAGNOSIS — D509 Iron deficiency anemia, unspecified: Secondary | ICD-10-CM | POA: Diagnosis not present

## 2021-01-18 DIAGNOSIS — N186 End stage renal disease: Secondary | ICD-10-CM | POA: Diagnosis not present

## 2021-01-21 DIAGNOSIS — N2581 Secondary hyperparathyroidism of renal origin: Secondary | ICD-10-CM | POA: Diagnosis not present

## 2021-01-21 DIAGNOSIS — N186 End stage renal disease: Secondary | ICD-10-CM | POA: Diagnosis not present

## 2021-01-21 DIAGNOSIS — Z992 Dependence on renal dialysis: Secondary | ICD-10-CM | POA: Diagnosis not present

## 2021-01-21 DIAGNOSIS — D509 Iron deficiency anemia, unspecified: Secondary | ICD-10-CM | POA: Diagnosis not present

## 2021-01-21 DIAGNOSIS — J158 Pneumonia due to other specified bacteria: Secondary | ICD-10-CM | POA: Diagnosis not present

## 2021-01-21 DIAGNOSIS — D631 Anemia in chronic kidney disease: Secondary | ICD-10-CM | POA: Diagnosis not present

## 2021-01-23 DIAGNOSIS — D509 Iron deficiency anemia, unspecified: Secondary | ICD-10-CM | POA: Diagnosis not present

## 2021-01-23 DIAGNOSIS — N186 End stage renal disease: Secondary | ICD-10-CM | POA: Diagnosis not present

## 2021-01-23 DIAGNOSIS — D631 Anemia in chronic kidney disease: Secondary | ICD-10-CM | POA: Diagnosis not present

## 2021-01-23 DIAGNOSIS — J158 Pneumonia due to other specified bacteria: Secondary | ICD-10-CM | POA: Diagnosis not present

## 2021-01-23 DIAGNOSIS — N2581 Secondary hyperparathyroidism of renal origin: Secondary | ICD-10-CM | POA: Diagnosis not present

## 2021-01-23 DIAGNOSIS — Z992 Dependence on renal dialysis: Secondary | ICD-10-CM | POA: Diagnosis not present

## 2021-01-25 DIAGNOSIS — D509 Iron deficiency anemia, unspecified: Secondary | ICD-10-CM | POA: Diagnosis not present

## 2021-01-25 DIAGNOSIS — N186 End stage renal disease: Secondary | ICD-10-CM | POA: Diagnosis not present

## 2021-01-25 DIAGNOSIS — Z992 Dependence on renal dialysis: Secondary | ICD-10-CM | POA: Diagnosis not present

## 2021-01-25 DIAGNOSIS — N2581 Secondary hyperparathyroidism of renal origin: Secondary | ICD-10-CM | POA: Diagnosis not present

## 2021-01-25 DIAGNOSIS — J158 Pneumonia due to other specified bacteria: Secondary | ICD-10-CM | POA: Diagnosis not present

## 2021-01-25 DIAGNOSIS — D631 Anemia in chronic kidney disease: Secondary | ICD-10-CM | POA: Diagnosis not present

## 2021-01-28 DIAGNOSIS — N2581 Secondary hyperparathyroidism of renal origin: Secondary | ICD-10-CM | POA: Diagnosis not present

## 2021-01-28 DIAGNOSIS — J158 Pneumonia due to other specified bacteria: Secondary | ICD-10-CM | POA: Diagnosis not present

## 2021-01-28 DIAGNOSIS — D509 Iron deficiency anemia, unspecified: Secondary | ICD-10-CM | POA: Diagnosis not present

## 2021-01-28 DIAGNOSIS — N186 End stage renal disease: Secondary | ICD-10-CM | POA: Diagnosis not present

## 2021-01-28 DIAGNOSIS — D631 Anemia in chronic kidney disease: Secondary | ICD-10-CM | POA: Diagnosis not present

## 2021-01-28 DIAGNOSIS — Z992 Dependence on renal dialysis: Secondary | ICD-10-CM | POA: Diagnosis not present

## 2021-01-30 DIAGNOSIS — N2581 Secondary hyperparathyroidism of renal origin: Secondary | ICD-10-CM | POA: Diagnosis not present

## 2021-01-30 DIAGNOSIS — N186 End stage renal disease: Secondary | ICD-10-CM | POA: Diagnosis not present

## 2021-01-30 DIAGNOSIS — Z992 Dependence on renal dialysis: Secondary | ICD-10-CM | POA: Diagnosis not present

## 2021-01-30 DIAGNOSIS — D509 Iron deficiency anemia, unspecified: Secondary | ICD-10-CM | POA: Diagnosis not present

## 2021-01-30 DIAGNOSIS — D631 Anemia in chronic kidney disease: Secondary | ICD-10-CM | POA: Diagnosis not present

## 2021-01-30 DIAGNOSIS — J158 Pneumonia due to other specified bacteria: Secondary | ICD-10-CM | POA: Diagnosis not present

## 2021-02-01 DIAGNOSIS — D631 Anemia in chronic kidney disease: Secondary | ICD-10-CM | POA: Diagnosis not present

## 2021-02-01 DIAGNOSIS — N2581 Secondary hyperparathyroidism of renal origin: Secondary | ICD-10-CM | POA: Diagnosis not present

## 2021-02-01 DIAGNOSIS — J158 Pneumonia due to other specified bacteria: Secondary | ICD-10-CM | POA: Diagnosis not present

## 2021-02-01 DIAGNOSIS — N186 End stage renal disease: Secondary | ICD-10-CM | POA: Diagnosis not present

## 2021-02-01 DIAGNOSIS — D509 Iron deficiency anemia, unspecified: Secondary | ICD-10-CM | POA: Diagnosis not present

## 2021-02-01 DIAGNOSIS — Z992 Dependence on renal dialysis: Secondary | ICD-10-CM | POA: Diagnosis not present

## 2021-02-03 DIAGNOSIS — Z992 Dependence on renal dialysis: Secondary | ICD-10-CM | POA: Diagnosis not present

## 2021-02-03 DIAGNOSIS — N186 End stage renal disease: Secondary | ICD-10-CM | POA: Diagnosis not present

## 2021-02-03 DIAGNOSIS — I12 Hypertensive chronic kidney disease with 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: Secondary | ICD-10-CM | POA: Diagnosis not present

## 2021-02-04 DIAGNOSIS — J158 Pneumonia due to other specified bacteria: Secondary | ICD-10-CM | POA: Diagnosis not present

## 2021-02-04 DIAGNOSIS — N2581 Secondary hyperparathyroidism of renal origin: Secondary | ICD-10-CM | POA: Diagnosis not present

## 2021-02-04 DIAGNOSIS — Z992 Dependence on renal dialysis: Secondary | ICD-10-CM | POA: Diagnosis not present

## 2021-02-04 DIAGNOSIS — D631 Anemia in chronic kidney disease: Secondary | ICD-10-CM | POA: Diagnosis not present

## 2021-02-04 DIAGNOSIS — N186 End stage renal disease: Secondary | ICD-10-CM | POA: Diagnosis not present

## 2021-02-04 DIAGNOSIS — D509 Iron deficiency anemia, unspecified: Secondary | ICD-10-CM | POA: Diagnosis not present

## 2021-02-06 DIAGNOSIS — D509 Iron deficiency anemia, unspecified: Secondary | ICD-10-CM | POA: Diagnosis not present

## 2021-02-06 DIAGNOSIS — D631 Anemia in chronic kidney disease: Secondary | ICD-10-CM | POA: Diagnosis not present

## 2021-02-06 DIAGNOSIS — N186 End stage renal disease: Secondary | ICD-10-CM | POA: Diagnosis not present

## 2021-02-06 DIAGNOSIS — J158 Pneumonia due to other specified bacteria: Secondary | ICD-10-CM | POA: Diagnosis not present

## 2021-02-06 DIAGNOSIS — N2581 Secondary hyperparathyroidism of renal origin: Secondary | ICD-10-CM | POA: Diagnosis not present

## 2021-02-06 DIAGNOSIS — Z992 Dependence on renal dialysis: Secondary | ICD-10-CM | POA: Diagnosis not present

## 2021-02-08 DIAGNOSIS — Z992 Dependence on renal dialysis: Secondary | ICD-10-CM | POA: Diagnosis not present

## 2021-02-08 DIAGNOSIS — D509 Iron deficiency anemia, unspecified: Secondary | ICD-10-CM | POA: Diagnosis not present

## 2021-02-08 DIAGNOSIS — J158 Pneumonia due to other specified bacteria: Secondary | ICD-10-CM | POA: Diagnosis not present

## 2021-02-08 DIAGNOSIS — N186 End stage renal disease: Secondary | ICD-10-CM | POA: Diagnosis not present

## 2021-02-08 DIAGNOSIS — D631 Anemia in chronic kidney disease: Secondary | ICD-10-CM | POA: Diagnosis not present

## 2021-02-08 DIAGNOSIS — N2581 Secondary hyperparathyroidism of renal origin: Secondary | ICD-10-CM | POA: Diagnosis not present

## 2021-02-11 DIAGNOSIS — D631 Anemia in chronic kidney disease: Secondary | ICD-10-CM | POA: Diagnosis not present

## 2021-02-11 DIAGNOSIS — J158 Pneumonia due to other specified bacteria: Secondary | ICD-10-CM | POA: Diagnosis not present

## 2021-02-11 DIAGNOSIS — Z992 Dependence on renal dialysis: Secondary | ICD-10-CM | POA: Diagnosis not present

## 2021-02-11 DIAGNOSIS — D509 Iron deficiency anemia, unspecified: Secondary | ICD-10-CM | POA: Diagnosis not present

## 2021-02-11 DIAGNOSIS — N186 End stage renal disease: Secondary | ICD-10-CM | POA: Diagnosis not present

## 2021-02-11 DIAGNOSIS — N2581 Secondary hyperparathyroidism of renal origin: Secondary | ICD-10-CM | POA: Diagnosis not present

## 2021-02-13 DIAGNOSIS — Z992 Dependence on renal dialysis: Secondary | ICD-10-CM | POA: Diagnosis not present

## 2021-02-13 DIAGNOSIS — N186 End stage renal disease: Secondary | ICD-10-CM | POA: Diagnosis not present

## 2021-02-13 DIAGNOSIS — D509 Iron deficiency anemia, unspecified: Secondary | ICD-10-CM | POA: Diagnosis not present

## 2021-02-13 DIAGNOSIS — N2581 Secondary hyperparathyroidism of renal origin: Secondary | ICD-10-CM | POA: Diagnosis not present

## 2021-02-13 DIAGNOSIS — D631 Anemia in chronic kidney disease: Secondary | ICD-10-CM | POA: Diagnosis not present

## 2021-02-13 DIAGNOSIS — J158 Pneumonia due to other specified bacteria: Secondary | ICD-10-CM | POA: Diagnosis not present

## 2021-02-14 DIAGNOSIS — Z992 Dependence on renal dialysis: Secondary | ICD-10-CM | POA: Diagnosis not present

## 2021-02-14 DIAGNOSIS — T8249XA Other complication of vascular dialysis catheter, initial encounter: Secondary | ICD-10-CM | POA: Diagnosis not present

## 2021-02-14 DIAGNOSIS — N186 End stage renal disease: Secondary | ICD-10-CM | POA: Diagnosis not present

## 2021-02-15 DIAGNOSIS — J158 Pneumonia due to other specified bacteria: Secondary | ICD-10-CM | POA: Diagnosis not present

## 2021-02-15 DIAGNOSIS — D509 Iron deficiency anemia, unspecified: Secondary | ICD-10-CM | POA: Diagnosis not present

## 2021-02-15 DIAGNOSIS — D631 Anemia in chronic kidney disease: Secondary | ICD-10-CM | POA: Diagnosis not present

## 2021-02-15 DIAGNOSIS — N186 End stage renal disease: Secondary | ICD-10-CM | POA: Diagnosis not present

## 2021-02-15 DIAGNOSIS — Z992 Dependence on renal dialysis: Secondary | ICD-10-CM | POA: Diagnosis not present

## 2021-02-15 DIAGNOSIS — N2581 Secondary hyperparathyroidism of renal origin: Secondary | ICD-10-CM | POA: Diagnosis not present

## 2021-02-18 DIAGNOSIS — D509 Iron deficiency anemia, unspecified: Secondary | ICD-10-CM | POA: Diagnosis not present

## 2021-02-18 DIAGNOSIS — J158 Pneumonia due to other specified bacteria: Secondary | ICD-10-CM | POA: Diagnosis not present

## 2021-02-18 DIAGNOSIS — D631 Anemia in chronic kidney disease: Secondary | ICD-10-CM | POA: Diagnosis not present

## 2021-02-18 DIAGNOSIS — N186 End stage renal disease: Secondary | ICD-10-CM | POA: Diagnosis not present

## 2021-02-18 DIAGNOSIS — N2581 Secondary hyperparathyroidism of renal origin: Secondary | ICD-10-CM | POA: Diagnosis not present

## 2021-02-18 DIAGNOSIS — Z992 Dependence on renal dialysis: Secondary | ICD-10-CM | POA: Diagnosis not present

## 2021-02-20 DIAGNOSIS — N2581 Secondary hyperparathyroidism of renal origin: Secondary | ICD-10-CM | POA: Diagnosis not present

## 2021-02-20 DIAGNOSIS — J158 Pneumonia due to other specified bacteria: Secondary | ICD-10-CM | POA: Diagnosis not present

## 2021-02-20 DIAGNOSIS — D509 Iron deficiency anemia, unspecified: Secondary | ICD-10-CM | POA: Diagnosis not present

## 2021-02-20 DIAGNOSIS — D631 Anemia in chronic kidney disease: Secondary | ICD-10-CM | POA: Diagnosis not present

## 2021-02-20 DIAGNOSIS — Z992 Dependence on renal dialysis: Secondary | ICD-10-CM | POA: Diagnosis not present

## 2021-02-20 DIAGNOSIS — N186 End stage renal disease: Secondary | ICD-10-CM | POA: Diagnosis not present

## 2021-02-21 DIAGNOSIS — C61 Malignant neoplasm of prostate: Secondary | ICD-10-CM | POA: Diagnosis not present

## 2021-02-21 DIAGNOSIS — R972 Elevated prostate specific antigen [PSA]: Secondary | ICD-10-CM | POA: Diagnosis not present

## 2021-02-22 DIAGNOSIS — D631 Anemia in chronic kidney disease: Secondary | ICD-10-CM | POA: Diagnosis not present

## 2021-02-22 DIAGNOSIS — D509 Iron deficiency anemia, unspecified: Secondary | ICD-10-CM | POA: Diagnosis not present

## 2021-02-22 DIAGNOSIS — N186 End stage renal disease: Secondary | ICD-10-CM | POA: Diagnosis not present

## 2021-02-22 DIAGNOSIS — Z992 Dependence on renal dialysis: Secondary | ICD-10-CM | POA: Diagnosis not present

## 2021-02-22 DIAGNOSIS — J158 Pneumonia due to other specified bacteria: Secondary | ICD-10-CM | POA: Diagnosis not present

## 2021-02-22 DIAGNOSIS — N2581 Secondary hyperparathyroidism of renal origin: Secondary | ICD-10-CM | POA: Diagnosis not present

## 2021-02-25 DIAGNOSIS — N2581 Secondary hyperparathyroidism of renal origin: Secondary | ICD-10-CM | POA: Diagnosis not present

## 2021-02-25 DIAGNOSIS — D631 Anemia in chronic kidney disease: Secondary | ICD-10-CM | POA: Diagnosis not present

## 2021-02-25 DIAGNOSIS — J158 Pneumonia due to other specified bacteria: Secondary | ICD-10-CM | POA: Diagnosis not present

## 2021-02-25 DIAGNOSIS — N186 End stage renal disease: Secondary | ICD-10-CM | POA: Diagnosis not present

## 2021-02-25 DIAGNOSIS — D509 Iron deficiency anemia, unspecified: Secondary | ICD-10-CM | POA: Diagnosis not present

## 2021-02-25 DIAGNOSIS — Z992 Dependence on renal dialysis: Secondary | ICD-10-CM | POA: Diagnosis not present

## 2021-02-27 DIAGNOSIS — J158 Pneumonia due to other specified bacteria: Secondary | ICD-10-CM | POA: Diagnosis not present

## 2021-02-27 DIAGNOSIS — N186 End stage renal disease: Secondary | ICD-10-CM | POA: Diagnosis not present

## 2021-02-27 DIAGNOSIS — Z992 Dependence on renal dialysis: Secondary | ICD-10-CM | POA: Diagnosis not present

## 2021-02-27 DIAGNOSIS — D631 Anemia in chronic kidney disease: Secondary | ICD-10-CM | POA: Diagnosis not present

## 2021-02-27 DIAGNOSIS — N2581 Secondary hyperparathyroidism of renal origin: Secondary | ICD-10-CM | POA: Diagnosis not present

## 2021-02-27 DIAGNOSIS — D509 Iron deficiency anemia, unspecified: Secondary | ICD-10-CM | POA: Diagnosis not present

## 2021-02-28 DIAGNOSIS — C61 Malignant neoplasm of prostate: Secondary | ICD-10-CM | POA: Diagnosis not present

## 2021-03-01 ENCOUNTER — Other Ambulatory Visit (HOSPITAL_COMMUNITY): Payer: Self-pay | Admitting: Urology

## 2021-03-01 DIAGNOSIS — N2581 Secondary hyperparathyroidism of renal origin: Secondary | ICD-10-CM | POA: Diagnosis not present

## 2021-03-01 DIAGNOSIS — D509 Iron deficiency anemia, unspecified: Secondary | ICD-10-CM | POA: Diagnosis not present

## 2021-03-01 DIAGNOSIS — C61 Malignant neoplasm of prostate: Secondary | ICD-10-CM

## 2021-03-01 DIAGNOSIS — D631 Anemia in chronic kidney disease: Secondary | ICD-10-CM | POA: Diagnosis not present

## 2021-03-01 DIAGNOSIS — J158 Pneumonia due to other specified bacteria: Secondary | ICD-10-CM | POA: Diagnosis not present

## 2021-03-01 DIAGNOSIS — N186 End stage renal disease: Secondary | ICD-10-CM | POA: Diagnosis not present

## 2021-03-01 DIAGNOSIS — Z992 Dependence on renal dialysis: Secondary | ICD-10-CM | POA: Diagnosis not present

## 2021-03-04 DIAGNOSIS — N186 End stage renal disease: Secondary | ICD-10-CM | POA: Diagnosis not present

## 2021-03-04 DIAGNOSIS — Z992 Dependence on renal dialysis: Secondary | ICD-10-CM | POA: Diagnosis not present

## 2021-03-04 DIAGNOSIS — D631 Anemia in chronic kidney disease: Secondary | ICD-10-CM | POA: Diagnosis not present

## 2021-03-04 DIAGNOSIS — D509 Iron deficiency anemia, unspecified: Secondary | ICD-10-CM | POA: Diagnosis not present

## 2021-03-04 DIAGNOSIS — N2581 Secondary hyperparathyroidism of renal origin: Secondary | ICD-10-CM | POA: Diagnosis not present

## 2021-03-04 DIAGNOSIS — J158 Pneumonia due to other specified bacteria: Secondary | ICD-10-CM | POA: Diagnosis not present

## 2021-03-06 DIAGNOSIS — D631 Anemia in chronic kidney disease: Secondary | ICD-10-CM | POA: Diagnosis not present

## 2021-03-06 DIAGNOSIS — N2581 Secondary hyperparathyroidism of renal origin: Secondary | ICD-10-CM | POA: Diagnosis not present

## 2021-03-06 DIAGNOSIS — J158 Pneumonia due to other specified bacteria: Secondary | ICD-10-CM | POA: Diagnosis not present

## 2021-03-06 DIAGNOSIS — I12 Hypertensive chronic kidney disease with 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: Secondary | ICD-10-CM | POA: Diagnosis not present

## 2021-03-06 DIAGNOSIS — Z992 Dependence on renal dialysis: Secondary | ICD-10-CM | POA: Diagnosis not present

## 2021-03-06 DIAGNOSIS — N186 End stage renal disease: Secondary | ICD-10-CM | POA: Diagnosis not present

## 2021-03-08 DIAGNOSIS — J158 Pneumonia due to other specified bacteria: Secondary | ICD-10-CM | POA: Diagnosis not present

## 2021-03-08 DIAGNOSIS — D631 Anemia in chronic kidney disease: Secondary | ICD-10-CM | POA: Diagnosis not present

## 2021-03-08 DIAGNOSIS — N2581 Secondary hyperparathyroidism of renal origin: Secondary | ICD-10-CM | POA: Diagnosis not present

## 2021-03-08 DIAGNOSIS — N186 End stage renal disease: Secondary | ICD-10-CM | POA: Diagnosis not present

## 2021-03-08 DIAGNOSIS — Z992 Dependence on renal dialysis: Secondary | ICD-10-CM | POA: Diagnosis not present

## 2021-03-11 DIAGNOSIS — D631 Anemia in chronic kidney disease: Secondary | ICD-10-CM | POA: Diagnosis not present

## 2021-03-11 DIAGNOSIS — N2581 Secondary hyperparathyroidism of renal origin: Secondary | ICD-10-CM | POA: Diagnosis not present

## 2021-03-11 DIAGNOSIS — J158 Pneumonia due to other specified bacteria: Secondary | ICD-10-CM | POA: Diagnosis not present

## 2021-03-11 DIAGNOSIS — Z992 Dependence on renal dialysis: Secondary | ICD-10-CM | POA: Diagnosis not present

## 2021-03-11 DIAGNOSIS — N186 End stage renal disease: Secondary | ICD-10-CM | POA: Diagnosis not present

## 2021-03-12 DIAGNOSIS — Z20822 Contact with and (suspected) exposure to covid-19: Secondary | ICD-10-CM | POA: Diagnosis not present

## 2021-03-13 ENCOUNTER — Other Ambulatory Visit (HOSPITAL_COMMUNITY): Payer: Medicare Other

## 2021-03-13 DIAGNOSIS — N186 End stage renal disease: Secondary | ICD-10-CM | POA: Diagnosis not present

## 2021-03-13 DIAGNOSIS — Z992 Dependence on renal dialysis: Secondary | ICD-10-CM | POA: Diagnosis not present

## 2021-03-13 DIAGNOSIS — N2581 Secondary hyperparathyroidism of renal origin: Secondary | ICD-10-CM | POA: Diagnosis not present

## 2021-03-13 DIAGNOSIS — D631 Anemia in chronic kidney disease: Secondary | ICD-10-CM | POA: Diagnosis not present

## 2021-03-13 DIAGNOSIS — J158 Pneumonia due to other specified bacteria: Secondary | ICD-10-CM | POA: Diagnosis not present

## 2021-03-15 ENCOUNTER — Other Ambulatory Visit (HOSPITAL_COMMUNITY): Payer: Medicare Other

## 2021-03-15 DIAGNOSIS — Z992 Dependence on renal dialysis: Secondary | ICD-10-CM | POA: Diagnosis not present

## 2021-03-15 DIAGNOSIS — N2581 Secondary hyperparathyroidism of renal origin: Secondary | ICD-10-CM | POA: Diagnosis not present

## 2021-03-15 DIAGNOSIS — J158 Pneumonia due to other specified bacteria: Secondary | ICD-10-CM | POA: Diagnosis not present

## 2021-03-15 DIAGNOSIS — D631 Anemia in chronic kidney disease: Secondary | ICD-10-CM | POA: Diagnosis not present

## 2021-03-15 DIAGNOSIS — N186 End stage renal disease: Secondary | ICD-10-CM | POA: Diagnosis not present

## 2021-03-18 DIAGNOSIS — D631 Anemia in chronic kidney disease: Secondary | ICD-10-CM | POA: Diagnosis not present

## 2021-03-18 DIAGNOSIS — N2581 Secondary hyperparathyroidism of renal origin: Secondary | ICD-10-CM | POA: Diagnosis not present

## 2021-03-18 DIAGNOSIS — Z992 Dependence on renal dialysis: Secondary | ICD-10-CM | POA: Diagnosis not present

## 2021-03-18 DIAGNOSIS — J158 Pneumonia due to other specified bacteria: Secondary | ICD-10-CM | POA: Diagnosis not present

## 2021-03-18 DIAGNOSIS — N186 End stage renal disease: Secondary | ICD-10-CM | POA: Diagnosis not present

## 2021-03-19 ENCOUNTER — Other Ambulatory Visit: Payer: Self-pay

## 2021-03-19 ENCOUNTER — Ambulatory Visit (HOSPITAL_COMMUNITY)
Admission: RE | Admit: 2021-03-19 | Discharge: 2021-03-19 | Disposition: A | Discharge status: Home / Self Care | Payer: Medicare Other | Source: Ambulatory Visit | Attending: Urology | Admitting: Urology

## 2021-03-19 DIAGNOSIS — I2510 Atherosclerotic heart disease of native coronary artery without angina pectoris: Secondary | ICD-10-CM | POA: Diagnosis not present

## 2021-03-19 DIAGNOSIS — L219 Seborrheic dermatitis, unspecified: Secondary | ICD-10-CM | POA: Insufficient documentation

## 2021-03-19 DIAGNOSIS — C61 Malignant neoplasm of prostate: Secondary | ICD-10-CM | POA: Insufficient documentation

## 2021-03-19 DIAGNOSIS — C775 Secondary and unspecified malignant neoplasm of intrapelvic lymph nodes: Secondary | ICD-10-CM | POA: Diagnosis not present

## 2021-03-19 DIAGNOSIS — I700 Atherosclerosis of aorta: Secondary | ICD-10-CM | POA: Diagnosis not present

## 2021-03-19 MED ORDER — PIFLIFOLASTAT F 18 (PYLARIFY) INJECTION
9.0000 | Freq: Once | INTRAVENOUS | Status: AC
  Administered 2021-03-19: 9 via INTRAVENOUS

## 2021-03-19 NOTE — Progress Notes (Signed)
GU Location of Tumor / Histology: Prostate Ca  If Prostate Cancer, Gleason Score is (4 + 3) and PSA is (32.0 as of 12/2020)  Biopsies  Dr. Gloriann Loan     Past/Anticipated interventions by urology, if any:   Past/Anticipated interventions by medical oncology, if any:   Weight changes, if any:  No stable weight.  IPSS:  26 SHIM:  20  Bowel/Bladder complaints, if any:  No bowel issues and bladder has urgency follow up with a dribble.  Nausea/Vomiting, if any: Occasional vomiting and nausea.  Pain issues, if any:  0/10  SAFETY ISSUES: Prior radiation? No Pacemaker/ICD?  No Possible current pregnancy?  Male Is the patient on methotrexate?  No  Current Complaints / other details:  Need more information on treatment options.  Dialysis Appointments:  Monday-Wednesday-Friday

## 2021-03-20 DIAGNOSIS — Z992 Dependence on renal dialysis: Secondary | ICD-10-CM | POA: Diagnosis not present

## 2021-03-20 DIAGNOSIS — N2581 Secondary hyperparathyroidism of renal origin: Secondary | ICD-10-CM | POA: Diagnosis not present

## 2021-03-20 DIAGNOSIS — N186 End stage renal disease: Secondary | ICD-10-CM | POA: Diagnosis not present

## 2021-03-20 DIAGNOSIS — J158 Pneumonia due to other specified bacteria: Secondary | ICD-10-CM | POA: Diagnosis not present

## 2021-03-20 DIAGNOSIS — D631 Anemia in chronic kidney disease: Secondary | ICD-10-CM | POA: Diagnosis not present

## 2021-03-21 ENCOUNTER — Other Ambulatory Visit: Payer: Self-pay

## 2021-03-21 ENCOUNTER — Encounter: Payer: Self-pay | Admitting: Urology

## 2021-03-21 ENCOUNTER — Ambulatory Visit
Admission: RE | Admit: 2021-03-21 | Discharge: 2021-03-21 | Disposition: A | Discharge status: Home / Self Care | Payer: Medicare Other | Source: Ambulatory Visit | Attending: Radiation Oncology | Admitting: Radiation Oncology

## 2021-03-21 VITALS — BP 166/98 | HR 60 | Temp 98.1°F | Resp 20 | Wt 183.0 lb

## 2021-03-21 DIAGNOSIS — I272 Pulmonary hypertension, unspecified: Secondary | ICD-10-CM | POA: Insufficient documentation

## 2021-03-21 DIAGNOSIS — I700 Atherosclerosis of aorta: Secondary | ICD-10-CM | POA: Diagnosis not present

## 2021-03-21 DIAGNOSIS — N2581 Secondary hyperparathyroidism of renal origin: Secondary | ICD-10-CM | POA: Insufficient documentation

## 2021-03-21 DIAGNOSIS — Z7952 Long term (current) use of systemic steroids: Secondary | ICD-10-CM | POA: Diagnosis not present

## 2021-03-21 DIAGNOSIS — Z79899 Other long term (current) drug therapy: Secondary | ICD-10-CM | POA: Insufficient documentation

## 2021-03-21 DIAGNOSIS — R011 Cardiac murmur, unspecified: Secondary | ICD-10-CM | POA: Insufficient documentation

## 2021-03-21 DIAGNOSIS — E785 Hyperlipidemia, unspecified: Secondary | ICD-10-CM | POA: Insufficient documentation

## 2021-03-21 DIAGNOSIS — J449 Chronic obstructive pulmonary disease, unspecified: Secondary | ICD-10-CM | POA: Insufficient documentation

## 2021-03-21 DIAGNOSIS — G4730 Sleep apnea, unspecified: Secondary | ICD-10-CM | POA: Insufficient documentation

## 2021-03-21 DIAGNOSIS — K7460 Unspecified cirrhosis of liver: Secondary | ICD-10-CM | POA: Diagnosis not present

## 2021-03-21 DIAGNOSIS — N186 End stage renal disease: Secondary | ICD-10-CM | POA: Diagnosis not present

## 2021-03-21 DIAGNOSIS — C61 Malignant neoplasm of prostate: Secondary | ICD-10-CM

## 2021-03-21 DIAGNOSIS — F419 Anxiety disorder, unspecified: Secondary | ICD-10-CM | POA: Insufficient documentation

## 2021-03-21 DIAGNOSIS — I12 Hypertensive chronic kidney disease with 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: Secondary | ICD-10-CM | POA: Diagnosis not present

## 2021-03-21 DIAGNOSIS — Z992 Dependence on renal dialysis: Secondary | ICD-10-CM | POA: Insufficient documentation

## 2021-03-21 DIAGNOSIS — Z87891 Personal history of nicotine dependence: Secondary | ICD-10-CM | POA: Diagnosis not present

## 2021-03-21 DIAGNOSIS — M109 Gout, unspecified: Secondary | ICD-10-CM | POA: Diagnosis not present

## 2021-03-21 DIAGNOSIS — K219 Gastro-esophageal reflux disease without esophagitis: Secondary | ICD-10-CM | POA: Diagnosis not present

## 2021-03-21 DIAGNOSIS — Z791 Long term (current) use of non-steroidal anti-inflammatories (NSAID): Secondary | ICD-10-CM | POA: Insufficient documentation

## 2021-03-21 DIAGNOSIS — R972 Elevated prostate specific antigen [PSA]: Secondary | ICD-10-CM | POA: Diagnosis not present

## 2021-03-21 MED ORDER — OXYBUTYNIN CHLORIDE ER 10 MG PO TB24: 10.0000 mg | ORAL_TABLET | Freq: Every day | ORAL | 2 refills | Status: DC

## 2021-03-21 NOTE — Progress Notes (Signed)
Radiation Oncology         [phone] ________________________________  Initial Outpatient Consultation  Name: James Wall MRN: [id number]  Date: 03/21/2021  DOB: October 28, 1965  [id number], Christy Sartorius, MD  Lucas Mallow, MD   REFERRING PHYSICIAN: Lucas Mallow, MD  DIAGNOSIS: 56 y.o. 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    ICD-10-CM   1. Malignant neoplasm of prostate ([place])  C61       HISTORY OF PRESENT ILLNESS: James Wall is a 56 y.o. male with a diagnosis of prostate cancer. He has a history of LUTS that have recently increased so he was referred to Dr. Gloriann Loan on 12/06/2020 for further evaluation.  His LUTS had improved spontaneously but he had screening for prostate cancer so a digital rectal examination was performed at that time, revealing no discrete nodularity or concerning findings.  A screening PSA was also performed that day and revealed a significantly elevated PSA at 36.30.  This was repeated for confirmation on 01/01/2021 and remained elevated at 32.0.  Therefore, the patient proceeded to transrectal ultrasound with 12 biopsies of the prostate on 02/21/2021.  The prostate volume measured 39.37 cc.  Out of 12 core biopsies, 12 were positive.  The maximum Gleason score was 4+3, and this was seen in the left apex lateral.  All other cores were Gleason 3+4.  A PSMA PET scan was performed on 03/20/2021 for disease staging and this confirms locally advanced disease with pelvic node involvement and a small metastatic implant or lymph node along the left bladder margin.  No distant metastatic disease was noted.  Of note, the patient has end-stage renal disease and is on dialysis.  He reports that he typically voids once daily, in the mornings. Since the time of his biopsy, he has had increased urgency and periodic bladder spasms.  The patient reviewed the biopsy results with his urologist  and he has kindly been referred today for discussion of potential radiation treatment options.   PREVIOUS RADIATION THERAPY: No  PAST MEDICAL HISTORY:  Past Medical History:  Diagnosis Date   Anemia    Anxiety    Arthritis    GOUT - pt not sure if this is true- not sure 11-30-18   AV fistula occlusion ([place]) 10/2015   Blood transfusion without reported diagnosis    Cirrhosis, nonalcoholic ([place])    COPD (chronic obstructive pulmonary disease) ([place])    Depression    ESRD on hemodialysis ([place])    Started HD Jan 2009.  ESRD was due to HTN.  Dx'd with HTN in hospital 1996 according to pt, they had to keep him so he could get Medicaid to afford the BP medications.  First saw a nephrologist and started HD in the same year 2009.  Gets HD at [facility] on a MWF schedule.  Does not have DM. He had a left RC AVF that never functioned, a left upper arm AVF that worked for about 5 years and as of June   GERD (gastroesophageal reflux disease)    Heart murmur    as a child per pt   Hyperlipidemia    Hypertension    NICM (nonischemic cardiomyopathy) ([place])    PSVT (paroxysmal supraventricular tachycardia) ([place])    Pulmonary hypertension ([place])    PVC's (premature ventricular contractions)    Secondary hyperparathyroidism ([place])    Sepsis ([place]) 02/2013   from AVF , treated  with Vancomycin.   Sleep apnea    no  longer using cpap      PAST SURGICAL HISTORY: Past Surgical History:  Procedure Laterality Date   AV FISTULA PLACEMENT  2009   Left lower arm AVF   AV FISTULA PLACEMENT Right 02/22/2013   Procedure:  CREATION  OF BRACHIAL CEPHALIC FISTULA RIGHT ARM;  Surgeon: Elam Dutch, MD;  Location: [facility];  Service: Vascular;  Laterality: Right;   AV FISTULA PLACEMENT Left 08/10/2014   Procedure: BASILIC VEIN TRANSPOSITION  ARTERIOVENOUS (AV) FISTULA CREATION LEFT UPPER ARM;  Surgeon: Mal Misty, MD;  Location: [facility] OR;  Service: Vascular;  Laterality: Left;   COLONOSCOPY      ESOPHAGOGASTRODUODENOSCOPY (EGD) WITH PROPOFOL N/A 04/12/2013   Procedure: ESOPHAGOGASTRODUODENOSCOPY (EGD) WITH PROPOFOL;  Surgeon: Arta Silence, MD;  Location: [facility];  Service: Endoscopy;  Laterality: N/A;   INSERTION OF DIALYSIS CATHETER N/A 12/23/2012   Procedure: INSERTION OF DIALYSIS CATHETER; ULTRASOUND GUIDED;  Surgeon: Angelia Mould, MD;  Location: [facility];  Service: Vascular;  Laterality: N/A;   INSERTION OF DIALYSIS CATHETER  10/22/2015   Right IJ non-tunneled HD catheter, placed again in 2019   IR FLUORO GUIDE CV LINE RIGHT  08/18/2019   IR FLUORO GUIDE CV LINE RIGHT  10/07/2019   IR GENERIC HISTORICAL  10/22/2015   IR US GUIDE VASC ACCESS RIGHT 10/22/2015 [facility]   IR GENERIC HISTORICAL  10/22/2015   IR FLUORO GUIDE CV LINE RIGHT 10/22/2015 [facility]   IR GENERIC HISTORICAL  10/23/2015   IR FLUORO GUIDE CV LINE RIGHT 10/23/2015 Marybelle Killings, MD [facility]   LEFT HEART CATHETERIZATION WITH CORONARY ANGIOGRAM N/A 07/13/2013   Procedure: LEFT HEART CATHETERIZATION WITH CORONARY ANGIOGRAM;  Surgeon: Jettie Booze, MD;  Location: [facility] CATH LAB;  Service: Cardiovascular;  Laterality: N/A;   LIGATION OF ARTERIOVENOUS  FISTULA Left 12/22/2012   Procedure: LIGATION OF ARTERIOVENOUS  FISTULA;EXCISION OF LARGE ANEURYSMS;;  Surgeon: Elam Dutch, MD;  Location: [facility] OR;  Service: Vascular;  Laterality: Left;   UPPER GASTROINTESTINAL ENDOSCOPY      FAMILY HISTORY:  Family History  Problem Relation Age of Onset   Hypertension Mother    Cerebrovascular Accident Father    Hypertension Father    Congestive Heart Failure Brother    Asthma Brother    Stomach cancer Neg Hx    Rectal cancer Neg Hx    Esophageal cancer Neg Hx    Colon cancer Neg Hx     SOCIAL HISTORY:  Social History   Socioeconomic History   Marital status: Single    Spouse name: Not on file   Number of children: 0   Years of education: 12th   Highest education level: High school graduate   Occupational History   Occupation: n/a  Tobacco Use   Smoking status: Former    Packs/day: 0.50    Years: 16.00    Pack years: 8.00    Types: Cigarettes    Quit date: 07/05/2019    Years since quitting: 1.7   Smokeless tobacco: Never  Vaping Use   Vaping Use: Never used  Substance and Sexual Activity   Alcohol use: Yes    Alcohol/week: 2.0 standard drinks    Types: 2 Shots of liquor per week   Drug use: Yes    Frequency: 2.0 times per week    Types: Marijuana    Comment: last used 11-28-18   Sexual activity: Not Currently  Other Topics Concern  Not on file  Social History Narrative   Patient lives alone in [place].    Patient has never been married and does not have any children.    Patients support system, his mother, pass away 2016.   Patient does not own his own vehicle, uses public transportation with no concerns at this time.   Social Determinants of Health   Financial Resource Strain: Low Risk    Difficulty of Paying Living Expenses: Not very hard  Food Insecurity: No Food Insecurity   Worried About Charity fundraiser in the Last Year: Never true   Ran Out of Food in the Last Year: Never true  Transportation Needs: No Transportation Needs   Lack of Transportation (Medical): No   Lack of Transportation (Non-Medical): No  Physical Activity: Inactive   Days of Exercise per Week: 0 days   Minutes of Exercise per Session: 0 min  Stress: No Stress Concern Present   Feeling of Stress : Only a little  Social Connections: Socially Isolated   Frequency of Communication with Friends and Family: Twice a week   Frequency of Social Gatherings with Friends and Family: Twice a week   Attends Religious Services: Never   Marine scientist or Organizations: No   Attends Music therapist: Never   Marital Status: Never married  Human resources officer Violence: Not At Risk   Fear of Current or Ex-Partner: No   Emotionally Abused: No   Physically Abused: No    Sexually Abused: No    ALLERGIES: Venofer  [ferric oxide] and Aspirin  MEDICATIONS:  Current Outpatient Medications  Medication Sig Dispense Refill   heparin 1000 unit/mL SOLN injection Heparin Sodium (Porcine) 1,000 Units/mL Systemic     acetaminophen (TYLENOL) 650 MG CR tablet Take 1 tablet (650 mg total) by mouth daily as needed for pain.     amLODipine (NORVASC) 10 MG tablet TAKE 1 TABLET(10 MG) BY MOUTH DAILY 90 tablet 0   chlorproMAZINE (THORAZINE) 10 MG tablet Take 1 tablet (10 mg total) by mouth 3 (three) times daily as needed for hiccups. 9 tablet 0   cloNIDine (CATAPRES - DOSED IN MG/24 HR) 0.3 mg/24hr patch Place 0.3 mg onto the skin every Sunday.     Doxercalciferol (HECTOROL IV) Dialysis Monday,Wednesday,friday     hydrALAZINE (APRESOLINE) 25 MG tablet Take 1 tablet (25 mg total) by mouth 2 (two) times daily. 180 tablet 3   hydrOXYzine (ATARAX/VISTARIL) 25 MG tablet Take 1 tablet (25 mg total) by mouth every 8 (eight) hours as needed for itching. Further refills need to come from your PCP. 30 tablet 3   Ipratropium-Albuterol (COMBIVENT) 20-100 MCG/ACT AERS respimat Inhale 1 puff into the lungs every 6 (six) hours as needed for wheezing. 4 g 0   Iron, Ferrous Sulfate, 325 (65 Fe) MG TABS Take 325 mg by mouth daily. 90 tablet 3   isosorbide dinitrate (ISORDIL) 20 MG tablet TAKE 1 TABLET(20 MG) BY MOUTH TWICE DAILY 180 tablet 3   meloxicam (MOBIC) 15 MG tablet Take 15 mg by mouth daily.     Methoxy PEG-Epoetin Beta (MIRCERA IJ) Dialysis Monday,Wednesday,friday     metoprolol tartrate (LOPRESSOR) 100 MG tablet Take 1 tablet by mouth twice a day.  ON DIALYSIS DAYS, TAKE AFTER DIALYSIS. 180 tablet 3   omeprazole (PRILOSEC) 40 MG capsule Take 40 mg by mouth daily as needed.     pantoprazole (PROTONIX) 40 MG tablet Take 1 tablet (40 mg total) by mouth 2 (two) times daily  before a meal. 180 tablet 3   predniSONE (DELTASONE) 50 MG tablet take 1 tablet by mouth daily with breakfast 5 tablet  0   senna (SENOKOT) 8.6 MG TABS tablet Take 1 tablet (8.6 mg total) by mouth daily. 90 tablet 3   sevelamer carbonate (RENVELA) 800 MG tablet Take 1,600-3,200 mg by mouth See admin instructions. 2-4 tabs with meals depending on meal size     tamsulosin (FLOMAX) 0.4 MG CAPS capsule TAKE 2 CAPSULES(0.8 MG) BY MOUTH DAILY 180 capsule 3   umeclidinium-vilanterol (ANORO ELLIPTA) 62.5-25 MCG/INH AEPB Inhale 1 puff into the lungs daily. 60 each 1   No current facility-administered medications for this encounter.    REVIEW OF SYSTEMS:  On review of systems, the patient reports that he is doing well overall. He denies any chest pain, shortness of breath, cough, fevers, chills, night sweats, or unintended weight changes. He has end-stage renal disease and is on dialysis.  He reports that he typically voids once daily, in the mornings. Since the time of his biopsy, he has had increased urgency and periodic bladder spasms. He was also having rectal pressure and burning following his biopsy but this has improved since starting Doxycycline and Mobic on 03/19/21. He denies any bowel disturbances, and denies abdominal pain, nausea or vomiting. He denies any new musculoskeletal or joint aches or pains. His IPSS was 26, indicating severe urinary symptoms. His SHIM was 20, indicating he has mild erectile dysfunction. A complete review of systems is obtained and is otherwise negative.  PHYSICAL EXAM:  Wt Readings from Last 3 Encounters:  03/21/21 183 lb (83 kg)  11/01/20 178 lb 6.4 oz (80.9 kg)  09/05/20 176 lb 12.8 oz (80.2 kg)   Temp Readings from Last 3 Encounters:  03/21/21 98.1 F (36.7 C)  08/10/20 98.8 F (37.1 C) (Oral)  07/19/19 98.2 F (36.8 C) (Oral)   BP Readings from Last 3 Encounters:  03/21/21 (!) 166/98  11/01/20 122/74  09/05/20 130/68   Pulse Readings from Last 3 Encounters:  03/21/21 60  11/01/20 65  09/05/20 69    /10  In general this is a well appearing African-American male in  no acute distress. He's alert and oriented x4 and appropriate throughout the examination. Cardiopulmonary assessment is negative for acute distress, and he exhibits normal effort.     KPS = 90  100 - Normal; no complaints; no evidence of disease. 90   - Able to carry on normal activity; minor signs or symptoms of disease. 80   - Normal activity with effort; some signs or symptoms of disease. 15   - Cares for self; unable to carry on normal activity or to do active work. 60   - Requires occasional assistance, but is able to care for most of his personal needs. 50   - Requires considerable assistance and frequent medical care. 32   - Disabled; requires special care and assistance. 17   - Severely disabled; hospital admission is indicated although death not imminent. 3   - Very sick; hospital admission necessary; active supportive treatment necessary. 10   - Moribund; fatal processes progressing rapidly. 0     - Dead  Karnofsky DA, Abelmann Hildreth, Craver LS and Burchenal [facility] [phone]) The use of the nitrogen mustards in the palliative treatment of carcinoma: with particular reference to bronchogenic carcinoma Cancer 1 634-56  LABORATORY DATA:  Lab Results  Component Value Date   WBC 7.8 11/01/2020   HGB 7.6 (L) 11/01/2020  HCT 26.5 (L) 11/01/2020   MCV 74 (L) 11/01/2020   PLT 362 11/01/2020   Lab Results  Component Value Date   NA 133 (L) 08/10/2020   K 4.6 08/10/2020   CL 92 (L) 08/10/2020   CO2 28 08/10/2020   Lab Results  Component Value Date   ALT 9 08/08/2020   AST 16 08/08/2020   ALKPHOS 45 08/08/2020   BILITOT 1.3 (H) 08/08/2020     RADIOGRAPHY: NM PET (PSMA) SKULL TO MID THIGH  Result Date: 03/20/2021 CLINICAL DATA:  Initial staging biopsy-proven prostate carcinoma. Multifocal Gleason 7 prostate carcinoma. EXAM: NUCLEAR MEDICINE PET SKULL BASE TO THIGH TECHNIQUE: 9.0 mCi F18 Piflufolastat (Pylarify) was injected intravenously. Full-ring PET imaging was performed from the  skull base to thigh after the radiotracer. CT data was obtained and used for attenuation correction and anatomic localization. COMPARISON:  None. FINDINGS: NECK No radiotracer activity in neck lymph nodes. Incidental CT finding: None CHEST Single focus of radiotracer activity in the the mediastinum SUV max equal 4.0. This activity is somewhat linear and may correlate with a vessel. No discrete lymph node identified level (SUV max equal 4.0 on image 68) Incidental CT finding: No suspicious pulmonary nodules. Benign appearing cyst in the LEFT lower lobe. Coronary artery calcification and aortic atherosclerotic calcification. Dialysis catheter anterior chest wall with tip in distal SVC. ABDOMEN/PELVIS Prostate: Focal radiotracer activity along the posterior margin of the prostate gland with SUV max equal 9.4. Small radiotracer avid implant along the posterior LEFT aspect bladder SUV max equal 7.2. No CT correlation small lesion measuring less than 2 mm Two adjacent radiotracer avid LEFT internal iliac lymph nodes present on image 187/CT series 4) measuring 4 mm and 5 mm with SUV max equal 23.3 (image 170 similar small radiotracer avid lymph node on the RIGHT measuring 4 mm image 180. Radiotracer avid lymph node along the RIGHT external iliac chain measures 5 mm with SUV max equal 6.4 on image 179. No periaortic retroperitoneal radiotracer avid lymph nodes. Lymph nodes: No abnormal radiotracer accumulation within pelvic or abdominal nodes. Liver: No evidence of liver metastasis Incidental CT finding: The kidneys minimally concentrate/discrete radiotracer consistent with severe renal insufficiency SKELETON No focal  activity to suggest skeletal metastasis. IMPRESSION: 1. Intense radiotracer activity within the posterior peripheral prostate gland consistent with prostate adenocarcinoma. 2. Focus of radiotracer activity along the posterior LEFT margin bladder is favored a local metastatic implant or lymph node. This lesion  is very small. 3. Local prostate cancer nodal metastasis small with small intensely radiotracer avid internal iliac lymph nodes and a RIGHT external iliac lymph node. 4. No radiotracer avid or enlarged lymph nodes in the periaortic retroperitoneum. 5. No visceral metastasis or skeletal metastasis. 6. Small focus of radiotracer activity in the high LEFT mediastinum is favored physiologic. Electronically Signed   By: Suzy Bouchard M.D.   On: 03/20/2021 13:57      IMPRESSION/PLAN: 1. 56 y.o. 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 We discussed the patient's workup and outlined the nature of prostate cancer in this setting. The patient's T stage, Gleason's score, and PSA put him into the high risk group. Accordingly, he is eligible for a variety of potential treatment options including prostatectomy or LT-ADT in combination with either 8 weeks of external radiation or 5 weeks of external radiation with an upfront brachytherapy boost. We discussed the available radiation techniques, and focused on the details  and logistics of delivery. The patient is not felt to be an ideal surgical candidate given his significant medical comorbidities.  We discussed and outlined the risks, benefits, short and long-term effects associated with radiotherapy and compared and contrasted these with prostatectomy. We discussed the role of SpaceOAR gel in reducing the rectal toxicity associated with radiotherapy. We also detailed the role of ADT in the treatment of high risk prostate cancer and outlined the associated side effects that could be expected with this therapy.  He was encouraged to ask questions that were answered to his stated satisfaction.  At the conclusion of our conversation, the patient is interested in moving forward with 8 weeks of external beam therapy concurrent with LT-ADT. We will share our discussion with Dr. Gloriann Loan and make  arrangements for a follow up visit, first available, to start ADT now and then we will coordinate for fiducial markers and SpaceOAR gel placement in April 2023, prior to simulation, to reduce rectal toxicity from radiotherapy. The patient appears to have a good understanding of his disease and our treatment recommendations which are of curative intent and is in agreement with the stated plan.  Therefore, we will move forward with treatment planning accordingly, in anticipation of beginning IMRT approximately 2 months after starting ADT.  We personally spent 70 minutes in this encounter including chart review, reviewing radiological studies, meeting face-to-face with the patient, entering orders and completing documentation.    Nicholos Johns, PA-C    Tyler Pita, MD  [facility] Direct Dial: [phone]   Fax: [phone] [email or web address]   Skype   LinkedIn

## 2021-03-21 NOTE — Progress Notes (Signed)
Introduced myself to the patient as the prostate nurse navigator.  No barriers to care identified at this time.  He is here to discuss his radiation treatment options.  I gave him my business card and asked him to call me with questions or concerns.  Verbalized understanding.

## 2021-03-22 ENCOUNTER — Telehealth: Payer: Self-pay | Admitting: *Deleted

## 2021-03-22 DIAGNOSIS — C61 Malignant neoplasm of prostate: Secondary | ICD-10-CM | POA: Diagnosis not present

## 2021-03-22 DIAGNOSIS — D631 Anemia in chronic kidney disease: Secondary | ICD-10-CM | POA: Diagnosis not present

## 2021-03-22 DIAGNOSIS — N186 End stage renal disease: Secondary | ICD-10-CM | POA: Diagnosis not present

## 2021-03-22 DIAGNOSIS — N2581 Secondary hyperparathyroidism of renal origin: Secondary | ICD-10-CM | POA: Diagnosis not present

## 2021-03-22 DIAGNOSIS — J158 Pneumonia due to other specified bacteria: Secondary | ICD-10-CM | POA: Diagnosis not present

## 2021-03-22 DIAGNOSIS — Z191 Hormone sensitive malignancy status: Secondary | ICD-10-CM | POA: Diagnosis not present

## 2021-03-22 DIAGNOSIS — Z992 Dependence on renal dialysis: Secondary | ICD-10-CM | POA: Diagnosis not present

## 2021-03-22 NOTE — Telephone Encounter (Signed)
CALLED PATIENT TO INFORM OF ADT APPT. FOR 04-04-21 - ARRIVAL TIME- 1:15 PM @ DR. BELL'S OFFICE, SPOKE WITH PATIENT AND HE IS AWARE OF THIS APPT.

## 2021-03-25 DIAGNOSIS — D631 Anemia in chronic kidney disease: Secondary | ICD-10-CM | POA: Diagnosis not present

## 2021-03-25 DIAGNOSIS — Z992 Dependence on renal dialysis: Secondary | ICD-10-CM | POA: Diagnosis not present

## 2021-03-25 DIAGNOSIS — J158 Pneumonia due to other specified bacteria: Secondary | ICD-10-CM | POA: Diagnosis not present

## 2021-03-25 DIAGNOSIS — N186 End stage renal disease: Secondary | ICD-10-CM | POA: Diagnosis not present

## 2021-03-25 DIAGNOSIS — N2581 Secondary hyperparathyroidism of renal origin: Secondary | ICD-10-CM | POA: Diagnosis not present

## 2021-03-26 ENCOUNTER — Other Ambulatory Visit: Payer: Self-pay | Admitting: Urology

## 2021-03-27 DIAGNOSIS — J158 Pneumonia due to other specified bacteria: Secondary | ICD-10-CM | POA: Diagnosis not present

## 2021-03-27 DIAGNOSIS — D631 Anemia in chronic kidney disease: Secondary | ICD-10-CM | POA: Diagnosis not present

## 2021-03-27 DIAGNOSIS — Z992 Dependence on renal dialysis: Secondary | ICD-10-CM | POA: Diagnosis not present

## 2021-03-27 DIAGNOSIS — N2581 Secondary hyperparathyroidism of renal origin: Secondary | ICD-10-CM | POA: Diagnosis not present

## 2021-03-27 DIAGNOSIS — N186 End stage renal disease: Secondary | ICD-10-CM | POA: Diagnosis not present

## 2021-03-29 DIAGNOSIS — N2581 Secondary hyperparathyroidism of renal origin: Secondary | ICD-10-CM | POA: Diagnosis not present

## 2021-03-29 DIAGNOSIS — D631 Anemia in chronic kidney disease: Secondary | ICD-10-CM | POA: Diagnosis not present

## 2021-03-29 DIAGNOSIS — J158 Pneumonia due to other specified bacteria: Secondary | ICD-10-CM | POA: Diagnosis not present

## 2021-03-29 DIAGNOSIS — N186 End stage renal disease: Secondary | ICD-10-CM | POA: Diagnosis not present

## 2021-03-29 DIAGNOSIS — Z992 Dependence on renal dialysis: Secondary | ICD-10-CM | POA: Diagnosis not present

## 2021-04-01 DIAGNOSIS — N2581 Secondary hyperparathyroidism of renal origin: Secondary | ICD-10-CM | POA: Diagnosis not present

## 2021-04-01 DIAGNOSIS — Z992 Dependence on renal dialysis: Secondary | ICD-10-CM | POA: Diagnosis not present

## 2021-04-01 DIAGNOSIS — J158 Pneumonia due to other specified bacteria: Secondary | ICD-10-CM | POA: Diagnosis not present

## 2021-04-01 DIAGNOSIS — N186 End stage renal disease: Secondary | ICD-10-CM | POA: Diagnosis not present

## 2021-04-01 DIAGNOSIS — D631 Anemia in chronic kidney disease: Secondary | ICD-10-CM | POA: Diagnosis not present

## 2021-04-03 DIAGNOSIS — I12 Hypertensive chronic kidney disease with 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: Secondary | ICD-10-CM | POA: Diagnosis not present

## 2021-04-03 DIAGNOSIS — N2581 Secondary hyperparathyroidism of renal origin: Secondary | ICD-10-CM | POA: Diagnosis not present

## 2021-04-03 DIAGNOSIS — D631 Anemia in chronic kidney disease: Secondary | ICD-10-CM | POA: Diagnosis not present

## 2021-04-03 DIAGNOSIS — N186 End stage renal disease: Secondary | ICD-10-CM | POA: Diagnosis not present

## 2021-04-03 DIAGNOSIS — D509 Iron deficiency anemia, unspecified: Secondary | ICD-10-CM | POA: Diagnosis not present

## 2021-04-03 DIAGNOSIS — J158 Pneumonia due to other specified bacteria: Secondary | ICD-10-CM | POA: Diagnosis not present

## 2021-04-03 DIAGNOSIS — Z992 Dependence on renal dialysis: Secondary | ICD-10-CM | POA: Diagnosis not present

## 2021-04-04 DIAGNOSIS — Z5111 Encounter for antineoplastic chemotherapy: Secondary | ICD-10-CM | POA: Diagnosis not present

## 2021-04-04 DIAGNOSIS — C61 Malignant neoplasm of prostate: Secondary | ICD-10-CM | POA: Diagnosis not present

## 2021-04-04 DIAGNOSIS — C778 Secondary and unspecified malignant neoplasm of lymph nodes of multiple regions: Secondary | ICD-10-CM | POA: Diagnosis not present

## 2021-04-05 DIAGNOSIS — J158 Pneumonia due to other specified bacteria: Secondary | ICD-10-CM | POA: Diagnosis not present

## 2021-04-05 DIAGNOSIS — D509 Iron deficiency anemia, unspecified: Secondary | ICD-10-CM | POA: Diagnosis not present

## 2021-04-05 DIAGNOSIS — N2581 Secondary hyperparathyroidism of renal origin: Secondary | ICD-10-CM | POA: Diagnosis not present

## 2021-04-05 DIAGNOSIS — Z992 Dependence on renal dialysis: Secondary | ICD-10-CM | POA: Diagnosis not present

## 2021-04-05 DIAGNOSIS — N186 End stage renal disease: Secondary | ICD-10-CM | POA: Diagnosis not present

## 2021-04-05 DIAGNOSIS — D631 Anemia in chronic kidney disease: Secondary | ICD-10-CM | POA: Diagnosis not present

## 2021-04-05 NOTE — Progress Notes (Signed)
RN spoke with patient to confirm CT Sim appointment and need for transportation.  Pt verbalized he would still like assistance with transportation.  RN notified Boykin Reaper to assist with getting patient coordinated.  Will follow up to ensure navigation needs are met.   ?

## 2021-04-08 DIAGNOSIS — D631 Anemia in chronic kidney disease: Secondary | ICD-10-CM | POA: Diagnosis not present

## 2021-04-08 DIAGNOSIS — J158 Pneumonia due to other specified bacteria: Secondary | ICD-10-CM | POA: Diagnosis not present

## 2021-04-08 DIAGNOSIS — Z992 Dependence on renal dialysis: Secondary | ICD-10-CM | POA: Diagnosis not present

## 2021-04-08 DIAGNOSIS — N186 End stage renal disease: Secondary | ICD-10-CM | POA: Diagnosis not present

## 2021-04-08 DIAGNOSIS — N2581 Secondary hyperparathyroidism of renal origin: Secondary | ICD-10-CM | POA: Diagnosis not present

## 2021-04-08 DIAGNOSIS — D509 Iron deficiency anemia, unspecified: Secondary | ICD-10-CM | POA: Diagnosis not present

## 2021-04-10 DIAGNOSIS — J158 Pneumonia due to other specified bacteria: Secondary | ICD-10-CM | POA: Diagnosis not present

## 2021-04-10 DIAGNOSIS — N2581 Secondary hyperparathyroidism of renal origin: Secondary | ICD-10-CM | POA: Diagnosis not present

## 2021-04-10 DIAGNOSIS — D509 Iron deficiency anemia, unspecified: Secondary | ICD-10-CM | POA: Diagnosis not present

## 2021-04-10 DIAGNOSIS — N186 End stage renal disease: Secondary | ICD-10-CM | POA: Diagnosis not present

## 2021-04-10 DIAGNOSIS — Z992 Dependence on renal dialysis: Secondary | ICD-10-CM | POA: Diagnosis not present

## 2021-04-10 DIAGNOSIS — D631 Anemia in chronic kidney disease: Secondary | ICD-10-CM | POA: Diagnosis not present

## 2021-04-11 ENCOUNTER — Encounter: Payer: Self-pay | Admitting: Urology

## 2021-04-11 NOTE — Progress Notes (Signed)
Patient got [facility] ADT 04/04/21 and is scheduled for fiducial markers and SpaceOAR gel placement 06/11/21 and [facility] 06/13/21. ? ?Nicholos Johns, MMS, PA-C ?[facility] ?Radiation Oncology Physician Assistant ?Direct Dial: [phone]  Fax: [phone] ? ?

## 2021-04-12 DIAGNOSIS — N2581 Secondary hyperparathyroidism of renal origin: Secondary | ICD-10-CM | POA: Diagnosis not present

## 2021-04-12 DIAGNOSIS — D631 Anemia in chronic kidney disease: Secondary | ICD-10-CM | POA: Diagnosis not present

## 2021-04-12 DIAGNOSIS — N186 End stage renal disease: Secondary | ICD-10-CM | POA: Diagnosis not present

## 2021-04-12 DIAGNOSIS — Z992 Dependence on renal dialysis: Secondary | ICD-10-CM | POA: Diagnosis not present

## 2021-04-12 DIAGNOSIS — D509 Iron deficiency anemia, unspecified: Secondary | ICD-10-CM | POA: Diagnosis not present

## 2021-04-12 DIAGNOSIS — J158 Pneumonia due to other specified bacteria: Secondary | ICD-10-CM | POA: Diagnosis not present

## 2021-04-15 DIAGNOSIS — D631 Anemia in chronic kidney disease: Secondary | ICD-10-CM | POA: Diagnosis not present

## 2021-04-15 DIAGNOSIS — J158 Pneumonia due to other specified bacteria: Secondary | ICD-10-CM | POA: Diagnosis not present

## 2021-04-15 DIAGNOSIS — N2581 Secondary hyperparathyroidism of renal origin: Secondary | ICD-10-CM | POA: Diagnosis not present

## 2021-04-15 DIAGNOSIS — D509 Iron deficiency anemia, unspecified: Secondary | ICD-10-CM | POA: Diagnosis not present

## 2021-04-15 DIAGNOSIS — Z992 Dependence on renal dialysis: Secondary | ICD-10-CM | POA: Diagnosis not present

## 2021-04-15 DIAGNOSIS — N186 End stage renal disease: Secondary | ICD-10-CM | POA: Diagnosis not present

## 2021-04-17 DIAGNOSIS — N186 End stage renal disease: Secondary | ICD-10-CM | POA: Diagnosis not present

## 2021-04-17 DIAGNOSIS — N2581 Secondary hyperparathyroidism of renal origin: Secondary | ICD-10-CM | POA: Diagnosis not present

## 2021-04-17 DIAGNOSIS — Z992 Dependence on renal dialysis: Secondary | ICD-10-CM | POA: Diagnosis not present

## 2021-04-17 DIAGNOSIS — D509 Iron deficiency anemia, unspecified: Secondary | ICD-10-CM | POA: Diagnosis not present

## 2021-04-17 DIAGNOSIS — J158 Pneumonia due to other specified bacteria: Secondary | ICD-10-CM | POA: Diagnosis not present

## 2021-04-17 DIAGNOSIS — D631 Anemia in chronic kidney disease: Secondary | ICD-10-CM | POA: Diagnosis not present

## 2021-04-19 DIAGNOSIS — N2581 Secondary hyperparathyroidism of renal origin: Secondary | ICD-10-CM | POA: Diagnosis not present

## 2021-04-19 DIAGNOSIS — D509 Iron deficiency anemia, unspecified: Secondary | ICD-10-CM | POA: Diagnosis not present

## 2021-04-19 DIAGNOSIS — N186 End stage renal disease: Secondary | ICD-10-CM | POA: Diagnosis not present

## 2021-04-19 DIAGNOSIS — D631 Anemia in chronic kidney disease: Secondary | ICD-10-CM | POA: Diagnosis not present

## 2021-04-19 DIAGNOSIS — J158 Pneumonia due to other specified bacteria: Secondary | ICD-10-CM | POA: Diagnosis not present

## 2021-04-19 DIAGNOSIS — Z992 Dependence on renal dialysis: Secondary | ICD-10-CM | POA: Diagnosis not present

## 2021-04-22 DIAGNOSIS — T8249XA Other complication of vascular dialysis catheter, initial encounter: Secondary | ICD-10-CM | POA: Diagnosis not present

## 2021-04-22 DIAGNOSIS — Z992 Dependence on renal dialysis: Secondary | ICD-10-CM | POA: Diagnosis not present

## 2021-04-22 DIAGNOSIS — N186 End stage renal disease: Secondary | ICD-10-CM | POA: Diagnosis not present

## 2021-04-24 DIAGNOSIS — N186 End stage renal disease: Secondary | ICD-10-CM | POA: Diagnosis not present

## 2021-04-24 DIAGNOSIS — D631 Anemia in chronic kidney disease: Secondary | ICD-10-CM | POA: Diagnosis not present

## 2021-04-24 DIAGNOSIS — J158 Pneumonia due to other specified bacteria: Secondary | ICD-10-CM | POA: Diagnosis not present

## 2021-04-24 DIAGNOSIS — N2581 Secondary hyperparathyroidism of renal origin: Secondary | ICD-10-CM | POA: Diagnosis not present

## 2021-04-24 DIAGNOSIS — D509 Iron deficiency anemia, unspecified: Secondary | ICD-10-CM | POA: Diagnosis not present

## 2021-04-24 DIAGNOSIS — Z992 Dependence on renal dialysis: Secondary | ICD-10-CM | POA: Diagnosis not present

## 2021-04-26 DIAGNOSIS — D631 Anemia in chronic kidney disease: Secondary | ICD-10-CM | POA: Diagnosis not present

## 2021-04-26 DIAGNOSIS — Z992 Dependence on renal dialysis: Secondary | ICD-10-CM | POA: Diagnosis not present

## 2021-04-26 DIAGNOSIS — N2581 Secondary hyperparathyroidism of renal origin: Secondary | ICD-10-CM | POA: Diagnosis not present

## 2021-04-26 DIAGNOSIS — D509 Iron deficiency anemia, unspecified: Secondary | ICD-10-CM | POA: Diagnosis not present

## 2021-04-26 DIAGNOSIS — N186 End stage renal disease: Secondary | ICD-10-CM | POA: Diagnosis not present

## 2021-04-26 DIAGNOSIS — J158 Pneumonia due to other specified bacteria: Secondary | ICD-10-CM | POA: Diagnosis not present

## 2021-04-29 DIAGNOSIS — J158 Pneumonia due to other specified bacteria: Secondary | ICD-10-CM | POA: Diagnosis not present

## 2021-04-29 DIAGNOSIS — N186 End stage renal disease: Secondary | ICD-10-CM | POA: Diagnosis not present

## 2021-04-29 DIAGNOSIS — D631 Anemia in chronic kidney disease: Secondary | ICD-10-CM | POA: Diagnosis not present

## 2021-04-29 DIAGNOSIS — N2581 Secondary hyperparathyroidism of renal origin: Secondary | ICD-10-CM | POA: Diagnosis not present

## 2021-04-29 DIAGNOSIS — D509 Iron deficiency anemia, unspecified: Secondary | ICD-10-CM | POA: Diagnosis not present

## 2021-04-29 DIAGNOSIS — Z992 Dependence on renal dialysis: Secondary | ICD-10-CM | POA: Diagnosis not present

## 2021-05-01 DIAGNOSIS — N2581 Secondary hyperparathyroidism of renal origin: Secondary | ICD-10-CM | POA: Diagnosis not present

## 2021-05-01 DIAGNOSIS — D631 Anemia in chronic kidney disease: Secondary | ICD-10-CM | POA: Diagnosis not present

## 2021-05-01 DIAGNOSIS — N186 End stage renal disease: Secondary | ICD-10-CM | POA: Diagnosis not present

## 2021-05-01 DIAGNOSIS — Z992 Dependence on renal dialysis: Secondary | ICD-10-CM | POA: Diagnosis not present

## 2021-05-01 DIAGNOSIS — D509 Iron deficiency anemia, unspecified: Secondary | ICD-10-CM | POA: Diagnosis not present

## 2021-05-01 DIAGNOSIS — J158 Pneumonia due to other specified bacteria: Secondary | ICD-10-CM | POA: Diagnosis not present

## 2021-05-02 ENCOUNTER — Ambulatory Visit: Payer: Medicare Other | Admitting: Internal Medicine

## 2021-05-02 DIAGNOSIS — C61 Malignant neoplasm of prostate: Secondary | ICD-10-CM | POA: Diagnosis not present

## 2021-05-03 DIAGNOSIS — D631 Anemia in chronic kidney disease: Secondary | ICD-10-CM | POA: Diagnosis not present

## 2021-05-03 DIAGNOSIS — J158 Pneumonia due to other specified bacteria: Secondary | ICD-10-CM | POA: Diagnosis not present

## 2021-05-03 DIAGNOSIS — Z992 Dependence on renal dialysis: Secondary | ICD-10-CM | POA: Diagnosis not present

## 2021-05-03 DIAGNOSIS — N186 End stage renal disease: Secondary | ICD-10-CM | POA: Diagnosis not present

## 2021-05-03 DIAGNOSIS — N2581 Secondary hyperparathyroidism of renal origin: Secondary | ICD-10-CM | POA: Diagnosis not present

## 2021-05-03 DIAGNOSIS — D509 Iron deficiency anemia, unspecified: Secondary | ICD-10-CM | POA: Diagnosis not present

## 2021-05-04 DIAGNOSIS — N186 End stage renal disease: Secondary | ICD-10-CM | POA: Diagnosis not present

## 2021-05-04 DIAGNOSIS — Z992 Dependence on renal dialysis: Secondary | ICD-10-CM | POA: Diagnosis not present

## 2021-05-04 DIAGNOSIS — I12 Hypertensive chronic kidney disease with 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: Secondary | ICD-10-CM | POA: Diagnosis not present

## 2021-05-06 DIAGNOSIS — D631 Anemia in chronic kidney disease: Secondary | ICD-10-CM | POA: Diagnosis not present

## 2021-05-06 DIAGNOSIS — Z992 Dependence on renal dialysis: Secondary | ICD-10-CM | POA: Diagnosis not present

## 2021-05-06 DIAGNOSIS — D509 Iron deficiency anemia, unspecified: Secondary | ICD-10-CM | POA: Diagnosis not present

## 2021-05-06 DIAGNOSIS — J158 Pneumonia due to other specified bacteria: Secondary | ICD-10-CM | POA: Diagnosis not present

## 2021-05-06 DIAGNOSIS — N2581 Secondary hyperparathyroidism of renal origin: Secondary | ICD-10-CM | POA: Diagnosis not present

## 2021-05-06 DIAGNOSIS — N186 End stage renal disease: Secondary | ICD-10-CM | POA: Diagnosis not present

## 2021-05-07 DIAGNOSIS — C61 Malignant neoplasm of prostate: Secondary | ICD-10-CM | POA: Diagnosis not present

## 2021-05-08 DIAGNOSIS — D631 Anemia in chronic kidney disease: Secondary | ICD-10-CM | POA: Diagnosis not present

## 2021-05-08 DIAGNOSIS — D509 Iron deficiency anemia, unspecified: Secondary | ICD-10-CM | POA: Diagnosis not present

## 2021-05-08 DIAGNOSIS — N2581 Secondary hyperparathyroidism of renal origin: Secondary | ICD-10-CM | POA: Diagnosis not present

## 2021-05-08 DIAGNOSIS — J158 Pneumonia due to other specified bacteria: Secondary | ICD-10-CM | POA: Diagnosis not present

## 2021-05-08 DIAGNOSIS — Z992 Dependence on renal dialysis: Secondary | ICD-10-CM | POA: Diagnosis not present

## 2021-05-08 DIAGNOSIS — N186 End stage renal disease: Secondary | ICD-10-CM | POA: Diagnosis not present

## 2021-05-10 DIAGNOSIS — N186 End stage renal disease: Secondary | ICD-10-CM | POA: Diagnosis not present

## 2021-05-10 DIAGNOSIS — D631 Anemia in chronic kidney disease: Secondary | ICD-10-CM | POA: Diagnosis not present

## 2021-05-10 DIAGNOSIS — D509 Iron deficiency anemia, unspecified: Secondary | ICD-10-CM | POA: Diagnosis not present

## 2021-05-10 DIAGNOSIS — Z992 Dependence on renal dialysis: Secondary | ICD-10-CM | POA: Diagnosis not present

## 2021-05-10 DIAGNOSIS — N2581 Secondary hyperparathyroidism of renal origin: Secondary | ICD-10-CM | POA: Diagnosis not present

## 2021-05-10 DIAGNOSIS — J158 Pneumonia due to other specified bacteria: Secondary | ICD-10-CM | POA: Diagnosis not present

## 2021-05-13 DIAGNOSIS — D509 Iron deficiency anemia, unspecified: Secondary | ICD-10-CM | POA: Diagnosis not present

## 2021-05-13 DIAGNOSIS — J158 Pneumonia due to other specified bacteria: Secondary | ICD-10-CM | POA: Diagnosis not present

## 2021-05-13 DIAGNOSIS — N186 End stage renal disease: Secondary | ICD-10-CM | POA: Diagnosis not present

## 2021-05-13 DIAGNOSIS — D631 Anemia in chronic kidney disease: Secondary | ICD-10-CM | POA: Diagnosis not present

## 2021-05-13 DIAGNOSIS — N2581 Secondary hyperparathyroidism of renal origin: Secondary | ICD-10-CM | POA: Diagnosis not present

## 2021-05-13 DIAGNOSIS — Z992 Dependence on renal dialysis: Secondary | ICD-10-CM | POA: Diagnosis not present

## 2021-05-15 DIAGNOSIS — N2581 Secondary hyperparathyroidism of renal origin: Secondary | ICD-10-CM | POA: Diagnosis not present

## 2021-05-15 DIAGNOSIS — N186 End stage renal disease: Secondary | ICD-10-CM | POA: Diagnosis not present

## 2021-05-15 DIAGNOSIS — J158 Pneumonia due to other specified bacteria: Secondary | ICD-10-CM | POA: Diagnosis not present

## 2021-05-15 DIAGNOSIS — Z992 Dependence on renal dialysis: Secondary | ICD-10-CM | POA: Diagnosis not present

## 2021-05-15 DIAGNOSIS — D509 Iron deficiency anemia, unspecified: Secondary | ICD-10-CM | POA: Diagnosis not present

## 2021-05-15 DIAGNOSIS — D631 Anemia in chronic kidney disease: Secondary | ICD-10-CM | POA: Diagnosis not present

## 2021-05-17 DIAGNOSIS — N186 End stage renal disease: Secondary | ICD-10-CM | POA: Diagnosis not present

## 2021-05-17 DIAGNOSIS — D509 Iron deficiency anemia, unspecified: Secondary | ICD-10-CM | POA: Diagnosis not present

## 2021-05-17 DIAGNOSIS — J158 Pneumonia due to other specified bacteria: Secondary | ICD-10-CM | POA: Diagnosis not present

## 2021-05-17 DIAGNOSIS — D631 Anemia in chronic kidney disease: Secondary | ICD-10-CM | POA: Diagnosis not present

## 2021-05-17 DIAGNOSIS — Z992 Dependence on renal dialysis: Secondary | ICD-10-CM | POA: Diagnosis not present

## 2021-05-17 DIAGNOSIS — N2581 Secondary hyperparathyroidism of renal origin: Secondary | ICD-10-CM | POA: Diagnosis not present

## 2021-05-20 DIAGNOSIS — N2581 Secondary hyperparathyroidism of renal origin: Secondary | ICD-10-CM | POA: Diagnosis not present

## 2021-05-20 DIAGNOSIS — N186 End stage renal disease: Secondary | ICD-10-CM | POA: Diagnosis not present

## 2021-05-20 DIAGNOSIS — Z992 Dependence on renal dialysis: Secondary | ICD-10-CM | POA: Diagnosis not present

## 2021-05-20 DIAGNOSIS — D631 Anemia in chronic kidney disease: Secondary | ICD-10-CM | POA: Diagnosis not present

## 2021-05-20 DIAGNOSIS — J158 Pneumonia due to other specified bacteria: Secondary | ICD-10-CM | POA: Diagnosis not present

## 2021-05-20 DIAGNOSIS — D509 Iron deficiency anemia, unspecified: Secondary | ICD-10-CM | POA: Diagnosis not present

## 2021-05-21 ENCOUNTER — Telehealth: Payer: Self-pay | Admitting: Family Medicine

## 2021-05-21 NOTE — Telephone Encounter (Signed)
?   James Wall ?DOB: May 13, 1965 ?MRN: [id number]  ? ?RIDER WAIVER AND RELEASE OF LIABILITY ? ?For purposes of improving physical access to our facilities, [facility] is pleased to partner with third parties to provide [facility] patients or other authorized individuals the option of convenient, on-demand ground transportation services (the ?[employer]?) through use of the technology service that enables users to request on-demand ground transportation from independent third-party providers. ? ?By opting to use and accept these [employer], I, the undersigned, hereby agree on behalf of myself, and on behalf of any minor child using the Government social research officer for whom I am the parent or legal guardian, as follows: ? ?Government social research officer provided to me are provided by independent third-party transportation providers who are not [employer] or employees and who are unaffiliated with [employer]. ?[facility] is neither a transportation carrier nor a common or public carrier. ?[facility] has no control over the quality or safety of the transportation that occurs as a result of the [employer]. ?[facility] cannot guarantee that any third-party transportation provider will complete any arranged transportation service. ?[facility] makes no representation, warranty, or guarantee regarding the reliability, timeliness, quality, safety, suitability, or availability of any of the Transport Services or that they will be error free. ?I fully understand that traveling by vehicle involves risks and dangers of serious bodily injury, including permanent disability, paralysis, and death. I agree, on behalf of myself and on behalf of any minor child using the Transport Services for whom I am the parent or legal guardian, that the entire risk arising out of my use of the [employer] remains solely with me, to the maximum extent permitted under applicable law. ?The [employer] are provided  ?as is? and ?as available.? [facility] disclaims all representations and warranties, express, implied or statutory, not expressly set out in these terms, including the implied warranties of merchantability and fitness for a particular purpose. ?I hereby waive and release [facility], its agents, employees, officers, directors, representatives, insurers, attorneys, assigns, successors, subsidiaries, and affiliates from any and all past, present, or future claims, demands, liabilities, actions, causes of action, or suits of any kind directly or indirectly arising from acceptance and use of the [employer]. ?I further waive and release [facility] and its affiliates from all present and future liability and responsibility for any injury or death to persons or damages to property caused by or related to the use of the [employer]. ?I have read this Waiver and Release of Liability, and I understand the terms used in it and their legal significance. This Waiver is freely and voluntarily given with the understanding that my right (as well as the right of any minor child for whom I am the parent or legal guardian using the [employer]) to legal recourse against  in connection with the [employer] is knowingly surrendered in return for use of these services. ? ? ?I attest that I read the consent document to [name], gave Mr. Demonbreun the opportunity to ask questions and answered the questions asked (if any). I affirm that [name] then provided consent for he's participation in this program.   ?  ?James Wall ? ?                          ?

## 2021-05-22 DIAGNOSIS — D509 Iron deficiency anemia, unspecified: Secondary | ICD-10-CM | POA: Diagnosis not present

## 2021-05-22 DIAGNOSIS — J158 Pneumonia due to other specified bacteria: Secondary | ICD-10-CM | POA: Diagnosis not present

## 2021-05-22 DIAGNOSIS — N2581 Secondary hyperparathyroidism of renal origin: Secondary | ICD-10-CM | POA: Diagnosis not present

## 2021-05-22 DIAGNOSIS — D631 Anemia in chronic kidney disease: Secondary | ICD-10-CM | POA: Diagnosis not present

## 2021-05-22 DIAGNOSIS — N186 End stage renal disease: Secondary | ICD-10-CM | POA: Diagnosis not present

## 2021-05-22 DIAGNOSIS — Z992 Dependence on renal dialysis: Secondary | ICD-10-CM | POA: Diagnosis not present

## 2021-05-24 DIAGNOSIS — N186 End stage renal disease: Secondary | ICD-10-CM | POA: Diagnosis not present

## 2021-05-24 DIAGNOSIS — N2581 Secondary hyperparathyroidism of renal origin: Secondary | ICD-10-CM | POA: Diagnosis not present

## 2021-05-24 DIAGNOSIS — D509 Iron deficiency anemia, unspecified: Secondary | ICD-10-CM | POA: Diagnosis not present

## 2021-05-24 DIAGNOSIS — D631 Anemia in chronic kidney disease: Secondary | ICD-10-CM | POA: Diagnosis not present

## 2021-05-24 DIAGNOSIS — Z992 Dependence on renal dialysis: Secondary | ICD-10-CM | POA: Diagnosis not present

## 2021-05-24 DIAGNOSIS — J158 Pneumonia due to other specified bacteria: Secondary | ICD-10-CM | POA: Diagnosis not present

## 2021-05-27 DIAGNOSIS — Z992 Dependence on renal dialysis: Secondary | ICD-10-CM | POA: Diagnosis not present

## 2021-05-27 DIAGNOSIS — J158 Pneumonia due to other specified bacteria: Secondary | ICD-10-CM | POA: Diagnosis not present

## 2021-05-27 DIAGNOSIS — N2581 Secondary hyperparathyroidism of renal origin: Secondary | ICD-10-CM | POA: Diagnosis not present

## 2021-05-27 DIAGNOSIS — D509 Iron deficiency anemia, unspecified: Secondary | ICD-10-CM | POA: Diagnosis not present

## 2021-05-27 DIAGNOSIS — N186 End stage renal disease: Secondary | ICD-10-CM | POA: Diagnosis not present

## 2021-05-27 DIAGNOSIS — D631 Anemia in chronic kidney disease: Secondary | ICD-10-CM | POA: Diagnosis not present

## 2021-05-28 ENCOUNTER — Telehealth: Payer: Self-pay | Admitting: *Deleted

## 2021-05-28 NOTE — Chronic Care Management (AMB) (Signed)
?  Care Management  ? ?Note ? ?05/28/2021 ?Name: James Wall MRN: [id number] DOB: Sep 10, 1965 ? ?James Wall is a 55 y.o. year old male who is a primary care patient of [name], MD. I reached out to [employer] by phone today offer care coordination services.  ? ?[name] was given information about care management services today including:  ?Care management services include personalized support from designated clinical staff supervised by his physician, including individualized plan of care and coordination with other care providers ?24/7 contact phone numbers for assistance for urgent and routine care needs. ?The patient may stop care management services at any time by phone call to the office staff. ? ?Patient agreed to services and verbal consent obtained.  ? ?Follow up plan: ?Telephone appointment with care management team member scheduled for:06/04/21 ?Laverda Sorenson  ?Care Guide, Embedded Care Coordination ?[facility]  Care Management  ?Direct Dial: [phone] ? ? ?

## 2021-05-29 DIAGNOSIS — D509 Iron deficiency anemia, unspecified: Secondary | ICD-10-CM | POA: Diagnosis not present

## 2021-05-29 DIAGNOSIS — N2581 Secondary hyperparathyroidism of renal origin: Secondary | ICD-10-CM | POA: Diagnosis not present

## 2021-05-29 DIAGNOSIS — Z992 Dependence on renal dialysis: Secondary | ICD-10-CM | POA: Diagnosis not present

## 2021-05-29 DIAGNOSIS — D631 Anemia in chronic kidney disease: Secondary | ICD-10-CM | POA: Diagnosis not present

## 2021-05-29 DIAGNOSIS — J158 Pneumonia due to other specified bacteria: Secondary | ICD-10-CM | POA: Diagnosis not present

## 2021-05-29 DIAGNOSIS — N186 End stage renal disease: Secondary | ICD-10-CM | POA: Diagnosis not present

## 2021-05-31 DIAGNOSIS — Z20822 Contact with and (suspected) exposure to covid-19: Secondary | ICD-10-CM | POA: Diagnosis not present

## 2021-05-31 DIAGNOSIS — N2581 Secondary hyperparathyroidism of renal origin: Secondary | ICD-10-CM | POA: Diagnosis not present

## 2021-05-31 DIAGNOSIS — J158 Pneumonia due to other specified bacteria: Secondary | ICD-10-CM | POA: Diagnosis not present

## 2021-05-31 DIAGNOSIS — N186 End stage renal disease: Secondary | ICD-10-CM | POA: Diagnosis not present

## 2021-05-31 DIAGNOSIS — Z992 Dependence on renal dialysis: Secondary | ICD-10-CM | POA: Diagnosis not present

## 2021-05-31 DIAGNOSIS — D631 Anemia in chronic kidney disease: Secondary | ICD-10-CM | POA: Diagnosis not present

## 2021-05-31 DIAGNOSIS — D509 Iron deficiency anemia, unspecified: Secondary | ICD-10-CM | POA: Diagnosis not present

## 2021-06-03 DIAGNOSIS — N2581 Secondary hyperparathyroidism of renal origin: Secondary | ICD-10-CM | POA: Diagnosis not present

## 2021-06-03 DIAGNOSIS — N186 End stage renal disease: Secondary | ICD-10-CM | POA: Diagnosis not present

## 2021-06-03 DIAGNOSIS — Z992 Dependence on renal dialysis: Secondary | ICD-10-CM | POA: Diagnosis not present

## 2021-06-03 DIAGNOSIS — D631 Anemia in chronic kidney disease: Secondary | ICD-10-CM | POA: Diagnosis not present

## 2021-06-03 DIAGNOSIS — I12 Hypertensive chronic kidney disease with 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: Secondary | ICD-10-CM | POA: Diagnosis not present

## 2021-06-03 DIAGNOSIS — J158 Pneumonia due to other specified bacteria: Secondary | ICD-10-CM | POA: Diagnosis not present

## 2021-06-03 DIAGNOSIS — D509 Iron deficiency anemia, unspecified: Secondary | ICD-10-CM | POA: Diagnosis not present

## 2021-06-04 ENCOUNTER — Ambulatory Visit: Payer: Medicare Other

## 2021-06-04 NOTE — Chronic Care Management (AMB) (Signed)
?Chronic Care Management  ? ?CCM RN Visit Note ? ?06/04/2021 ?Name: James Wall MRN: [id number] DOB: 11-13-1965 ? ?Subjective: ?James Wall is a 56 y.o. year old male who is a primary care patient of [name], MD. The care management team was consulted for assistance with disease management and care coordination needs.   ? ?Engaged with patient by telephone for initial visit in response to provider referral for case management and/or care coordination services.  ? ?Consent to Services:  ?The patient was given information about Chronic Care Management services, agreed to services, and gave verbal consent prior to initiation of services.  Please see initial visit note for detailed documentation.  ? ?Patient agreed to services and verbal consent obtained.  ? ? ?Summary: Currently, the patient does not monitor his blood pressure at home as it is checked during dialysis sessions three times a week. However, he has agreed to start monitoring it at home and recording the values.. See Care Plan below for interventions and patient self-care actives. ? ?Recommendation: The patient may benefit from taking medications as prescribed, monitoring food intake, checking Blood sugar and record values, calling your physician if numbers are abnormal, and The patient agrees. ? ?Follow up Plan: Patient would like continued follow-up.  CCM RNCM will outreach the patient within the next 2 weeks.  Patient will call office if needed prior to next encounter ? ? ?Assessment: Review of patient past medical history, allergies, medications, health status, including review of consultants reports, laboratory and other test data, was performed as part of comprehensive evaluation and provision of chronic care management services.  ? ?SDOH (Social Determinants of Health) assessments and interventions performed:   ? ?CCM Care Plan ? ? ? ? ?Conditions to be addressed/monitored:HTN ? ?Care Plan : RN Case Manager  ?Updates made by  Lazaro Arms, RN since 06/04/2021 12:00 AM  ?  ? ?Problem: General Plan of Care in a patiednt with Hypertention   ?  ? ?Goal: The patient will learn to monitor and manage his blood pressure through lifestyle modigifcations to prevet further complications   ?Start Date: 06/04/2021  ?Expected End Date: 06/05/2022  ?Priority: High  ?Note:   ?Current Barriers:  ?Chronic Disease Management support and education needs related to HTN ? ?RNCM Clinical Goal(s):  ?Patient will verbalize basic understanding of HTN disease process and self health management plan as evidenced by keeping his blood pressure within acceptable levels  through collaboration with RN Care manager, provider, and care team.  ? ?Interventions: ?1:1 collaboration with primary care provider regarding development and update of comprehensive plan of care as evidenced by provider attestation and co-signature ?Inter-disciplinary care team collaboration (see longitudinal plan of care) ?Evaluation of current treatment plan related to  self management and patient's adherence to plan as established by provider ? ? ?Hypertension: (Status: New goal.) Long Term Goal  ?Last practice recorded BP readings:  ?BP Readings from Last 3 Encounters:  ?03/21/21 (!) 166/98  ?11/01/20 122/74  ?09/05/20 130/68  ?Most recent eGFR/CrCl: No results found for: EGFR  No components found for: CRCL ? ?Evaluation of current treatment plan related to hypertension self management and patient's adherence to plan as established by provider;   ?Reviewed medications with patient and discussed importance of compliance;  ?Discussed plans with patient for ongoing care management follow up and provided patient with direct contact information for care management team; ?Advised patient, providing education and rationale, to monitor blood pressure daily and record, calling PCP for findings outside established  parameters;  ?06/04/21:  I spoke with James Wall over the phone for our initial meeting, which was  arranged through a referral. During our conversation, I introduced myself as a member of CCM and explained my responsibilities. [name] shared that he lives alone and is able to perform his daily activities, but has to take his time due to physical limitations. I conducted a thorough review of his medical history, including allergies, medications, falls, and overall health status, and inquired about any social determinants of health. However, no additional needs were identified. He currently attends dialysis sessions on Mondays, Wednesdays, and Fridays, and will soon begin undergoing chemotherapy for prostate cancer. [name] requested that I monitor his hypertension, and he plans to check his blood pressure on the days he is at home. To support his health management, I will provide educational materials to him. ? ? ? ? ? ? ?Patient Goals/Self Care Activities: ?-Patient/Caregiver will self-administer medications as prescribed as evidenced by self-report/primary caregiver report  ?-Patient/Caregiver will attend all scheduled provider appointments as evidenced by clinician review of documented attendance to scheduled appointments and patient/caregiver report ?-Patient/Caregiver will call pharmacy for medication refills as evidenced by patient report and review of pharmacy fill history as appropriate ?-Patient/Caregiver will call provider office for new concerns or questions as evidenced by review of documented incoming telephone call notes and patient report ?-Patient/Caregiver verbalizes understanding of plan ?-Patient/Caregiver will focus on medication adherence by taking medications as prescribed  ?-Calls provider office for new concerns, questions, or BP outside discussed parameters ?-Checks BP and records as discussed ?-Follows a low sodium diet/DASH diet  ?  ? ?  ? ?Lazaro Arms RN, BSN, [place] ?Care Management Coordinator ?[facility]  ?Phone: [phone]  ?  ? ? ? ? ? ?

## 2021-06-04 NOTE — Patient Instructions (Signed)
?James Wall  it was nice speaking with you. Please call me directly [phone] if you have questions about the goals we discussed. ? ? ?Patient Care Plan: RN Case Manager  ?  ? ?Problem Identified: General Plan of Care in a patiednt with Hypertention   ?  ? ?Goal: The patient will learn to monitor and manage his blood pressure through lifestyle modigifcations to prevet further complications   ?Start Date: 06/04/2021  ?Expected End Date: 06/05/2022  ?Priority: High  ?Note:   ?Current Barriers:  ?Chronic Disease Management support and education needs related to HTN ? ?RNCM Clinical Goal(s):  ?Patient will verbalize basic understanding of HTN disease process and self health management plan as evidenced by keeping his blood pressure within acceptable levels  through collaboration with RN Care manager, provider, and care team.  ? ?Interventions: ?1:1 collaboration with primary care provider regarding development and update of comprehensive plan of care as evidenced by provider attestation and co-signature ?Inter-disciplinary care team collaboration (see longitudinal plan of care) ?Evaluation of current treatment plan related to  self management and patient's adherence to plan as established by provider ? ? ?Hypertension: (Status: New goal.) Long Term Goal  ?Last practice recorded BP readings:  ?BP Readings from Last 3 Encounters:  ?03/21/21 (!) 166/98  ?11/01/20 122/74  ?09/05/20 130/68  ?Most recent eGFR/CrCl: No results found for: EGFR  No components found for: CRCL ? ?Evaluation of current treatment plan related to hypertension self management and patient's adherence to plan as established by provider;   ?Reviewed medications with patient and discussed importance of compliance;  ?Discussed plans with patient for ongoing care management follow up and provided patient with direct contact information for care management team; ?Advised patient, providing education and rationale, to monitor blood pressure daily and  record, calling PCP for findings outside established parameters;  ?06/04/21:  I spoke with James Wall over the phone for our initial meeting, which was arranged through a referral. During our conversation, I introduced myself as a member of CCM and explained my responsibilities. James Wall shared that he lives alone and is able to perform his daily activities, but has to take his time due to physical limitations. I conducted a thorough review of his medical history, including allergies, medications, falls, and overall health status, and inquired about any social determinants of health. However, no additional needs were identified. He currently attends dialysis sessions on Mondays, Wednesdays, and Fridays, and will soon begin undergoing chemotherapy for prostate cancer. James Wall requested that I monitor his hypertension, and he plans to check his blood pressure on the days he is at home. To support his health management, I will provide educational materials to him. ? ? ? ? ? ? ?Patient Goals/Self Care Activities: ?-Patient/Caregiver will self-administer medications as prescribed as evidenced by self-report/primary caregiver report  ?-Patient/Caregiver will attend all scheduled provider appointments as evidenced by clinician review of documented attendance to scheduled appointments and patient/caregiver report ?-Patient/Caregiver will call pharmacy for medication refills as evidenced by patient report and review of pharmacy fill history as appropriate ?-Patient/Caregiver will call provider office for new concerns or questions as evidenced by review of documented incoming telephone call notes and patient report ?-Patient/Caregiver verbalizes understanding of plan ?-Patient/Caregiver will focus on medication adherence by taking medications as prescribed  ?-Calls provider office for new concerns, questions, or BP outside discussed parameters ?-Checks BP and records as discussed ?-Follows a low sodium diet/DASH diet   ?  ? ?  ?  ? ?James Wall  received Care Management services today:  ?Care Management services include personalized support from designated clinical staff supervised by his physician, including individualized plan of care and coordination with other care providers ?24/7 contact [phone] for assistance for urgent and routine care needs. ?Care Management are voluntary services and be declined at any time by calling the office. ? ?The patient can view on my chart   ? ?Follow Up Plan: Patient would like continued follow-up.  CCM RNCM will outreach the patient within the next 2 weeks.  Patient will call office if needed prior to next encounter ? ? ?Lazaro Arms, RN  ? ?[phone]  ?

## 2021-06-05 DIAGNOSIS — D631 Anemia in chronic kidney disease: Secondary | ICD-10-CM | POA: Diagnosis not present

## 2021-06-05 DIAGNOSIS — D509 Iron deficiency anemia, unspecified: Secondary | ICD-10-CM | POA: Diagnosis not present

## 2021-06-05 DIAGNOSIS — J158 Pneumonia due to other specified bacteria: Secondary | ICD-10-CM | POA: Diagnosis not present

## 2021-06-05 DIAGNOSIS — N186 End stage renal disease: Secondary | ICD-10-CM | POA: Diagnosis not present

## 2021-06-05 DIAGNOSIS — N2581 Secondary hyperparathyroidism of renal origin: Secondary | ICD-10-CM | POA: Diagnosis not present

## 2021-06-05 DIAGNOSIS — Z992 Dependence on renal dialysis: Secondary | ICD-10-CM | POA: Diagnosis not present

## 2021-06-06 ENCOUNTER — Encounter (HOSPITAL_BASED_OUTPATIENT_CLINIC_OR_DEPARTMENT_OTHER): Payer: Self-pay | Admitting: Urology

## 2021-06-06 ENCOUNTER — Other Ambulatory Visit: Payer: Self-pay

## 2021-06-06 DIAGNOSIS — C778 Secondary and unspecified malignant neoplasm of lymph nodes of multiple regions: Secondary | ICD-10-CM | POA: Diagnosis not present

## 2021-06-06 DIAGNOSIS — C61 Malignant neoplasm of prostate: Secondary | ICD-10-CM | POA: Diagnosis not present

## 2021-06-06 NOTE — Progress Notes (Addendum)
ADDENDUM:  Chart reviewed by anesthesia, Dr Fransisco Beau MDA, via phone , stated pt needs to be done at main OR.  ? ? ?Spoke w/ via phone for pre-op interview--- pt ?Lab needs dos----  istat             ?Lab results------ current ekg in epic/ chart ?COVID test -----patient states asymptomatic no test needed ? ?Arrive at ------- 0530 on 06-11-2021 ?NPO after MN NO Solid Food.  Clear liquids from MN until--- 0430 ?Med rec completed ?Medications to take morning of surgery ----- hydralazine, protonix ?Diabetic medication ----- n/a ? ?Patient instructed no nail polish to be worn day of surgery ?Patient instructed to bring photo id and insurance card day of surgery ?Patient aware to have Driver (ride ) / caregiver  for 24 hours after surgery -- friend, Marjie Skiff ? ?Patient Special Instructions ----- asked to bring inhaler's dos. Will do one fleet enema night before surgery ?Pre-Op special Istructions ----- Per pt please used right arm for bp, AV fistula is right side chest, he states hard stick ? ?Patient verbalized understanding of instructions that were given at this phone interview. ?Patient denies  chest pain, fever, cough at this phone interview.  ? ? 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 ?Pt denies cardiac s&s, sob w/ stairs/ long distance, and no peripheral swelling.  Pt stated AVF right side chest and was told 06-05-2021 Hg 7. ? ?PCP: Dr Mickie Kay (lov 09-05-2020 epic) ?Cardiologist : dr Jerilynn Mages. Gasper Sells ([place] 11-01-2020 epic) ?[facility], Dr Justin Mend ?Chest x-ray : CT 08-09-2020 ?EKG : 08-28-2020 ?Echo : 09-13-2020 ?Stress test: 06-16-2013 ?Cardiac Cath : 07-13-2013 ?Activity level: sob w/ stairs and long distance ?Sleep Study/ CPAP : Yes./ NO  ?

## 2021-06-07 DIAGNOSIS — D509 Iron deficiency anemia, unspecified: Secondary | ICD-10-CM | POA: Diagnosis not present

## 2021-06-07 DIAGNOSIS — N186 End stage renal disease: Secondary | ICD-10-CM | POA: Diagnosis not present

## 2021-06-07 DIAGNOSIS — D631 Anemia in chronic kidney disease: Secondary | ICD-10-CM | POA: Diagnosis not present

## 2021-06-07 DIAGNOSIS — Z992 Dependence on renal dialysis: Secondary | ICD-10-CM | POA: Diagnosis not present

## 2021-06-07 DIAGNOSIS — J158 Pneumonia due to other specified bacteria: Secondary | ICD-10-CM | POA: Diagnosis not present

## 2021-06-07 DIAGNOSIS — N2581 Secondary hyperparathyroidism of renal origin: Secondary | ICD-10-CM | POA: Diagnosis not present

## 2021-06-09 DIAGNOSIS — Z20822 Contact with and (suspected) exposure to covid-19: Secondary | ICD-10-CM | POA: Diagnosis not present

## 2021-06-10 ENCOUNTER — Encounter (HOSPITAL_COMMUNITY): Payer: Self-pay | Admitting: Urology

## 2021-06-10 ENCOUNTER — Other Ambulatory Visit: Payer: Self-pay

## 2021-06-10 DIAGNOSIS — D631 Anemia in chronic kidney disease: Secondary | ICD-10-CM | POA: Diagnosis not present

## 2021-06-10 DIAGNOSIS — N186 End stage renal disease: Secondary | ICD-10-CM | POA: Diagnosis not present

## 2021-06-10 DIAGNOSIS — D509 Iron deficiency anemia, unspecified: Secondary | ICD-10-CM | POA: Diagnosis not present

## 2021-06-10 DIAGNOSIS — J158 Pneumonia due to other specified bacteria: Secondary | ICD-10-CM | POA: Diagnosis not present

## 2021-06-10 DIAGNOSIS — N2581 Secondary hyperparathyroidism of renal origin: Secondary | ICD-10-CM | POA: Diagnosis not present

## 2021-06-10 DIAGNOSIS — Z992 Dependence on renal dialysis: Secondary | ICD-10-CM | POA: Diagnosis not present

## 2021-06-10 NOTE — Patient Instructions (Addendum)
DUE TO COVID-19 ONLY TWO VISITORS  (aged 57 and older)  IS ALLOWED TO COME WITH YOU AND STAY IN THE WAITING ROOM ONLY DURING PRE OP AND PROCEDURE.   ?**NO VISITORS ARE ALLOWED IN THE SHORT STAY AREA OR RECOVERY ROOM!!** ? ?You are not required to quarantine ?Hand Hygiene often ?Do NOT share personal items ?Notify your provider if you are in close contact with someone who has COVID or you develop fever 100.4 or greater, new onset of sneezing, cough, sore throat, shortness of breath or body aches. ? ?     ? Your procedure is scheduled on:  06-11-21 ? ? Report to [facility] Main Entrance ? ?  Report to admitting at 12:00 PM ? ? Call this number if you have problems the morning of surgery [phone] ? ? Do not eat food :After Midnight. ? ? After Midnight you may have the following liquids until 11:15 AM DAY OF SURGERY ? ?Water ?Black Coffee (sugar ok, NO MILK/CREAM OR CREAMERS)  ?Tea (sugar ok, NO MILK/CREAM OR CREAMERS) regular and decaf                             ?Plain Jell-O (NO RED)                                           ?Fruit ices (not with fruit pulp, NO RED)                                     ?Popsicles (NO RED)                                                                  ?Juice: apple, WHITE grape, WHITE cranberry ?Sports drinks like Gatorade (NO RED) ?Clear broth(vegetable,chicken,beef) ? ?              ?FOLLOW BOWEL PREP AND ANY ADDITIONAL PRE OP INSTRUCTIONS YOU RECEIVED FROM YOUR SURGEON'S OFFICE!!! ? ?-Fleet enema ?  ?  ?Oral Hygiene is also important to reduce your risk of infection.                                    ?Remember - BRUSH YOUR TEETH THE MORNING OF SURGERY WITH YOUR REGULAR TOOTHPASTE ? ? Do NOT smoke after Midnight ? ?Take these medicines the morning of surgery with A SIP OF WATER: Tylenol, Hydralazine, Hydroxyzine, Omeprazole, Pantoprazole ?                  ?           You may not have any metal on your body including jewelry, and body piercing ? ?           Do not wear  lotions, powders, cologne, or deodorant ? ?            Men may shave face and neck. ? ? Do not bring valuables to the hospital. [facility]  NOT RESPONSIBLE   FOR VALUABLES. ? ? Contacts, dentures or bridgework may not be worn into surgery. ?  ?Patients discharged on the day of surgery will not be allowed to drive home.  Someone NEEDS to stay with you for the first 24 hours after anesthesia. ? ?Please read over the following fact sheets you were given: [facility] [phone] ? ?[facility] - Preparing for Surgery ?Before surgery, you can play an important role.  Because skin is not sterile, your skin needs to be as free of germs as possible.  You can reduce the number of germs on your skin by washing with CHG (chlorahexidine gluconate) soap before surgery.  CHG is an antiseptic cleaner which kills germs and bonds with the skin to continue killing germs even after washing. ?Please DO NOT use if you have an allergy to CHG or antibacterial soaps.  If your skin becomes reddened/irritated stop using the CHG and inform your nurse when you arrive at Short Stay. ?Do not shave (including legs and underarms) for at least 48 hours prior to the first CHG shower.  You may shave your face/neck. ? ?Please follow these instructions carefully: ? 1.  Shower with CHG Soap the night before surgery and the  morning of surgery. ? 2.  If you choose to wash your hair, wash your hair first as usual with your normal  shampoo. ? 3.  After you shampoo, rinse your hair and body thoroughly to remove the shampoo.                            ? 4.  Use CHG as you would any other liquid soap.  You can apply chg directly to the skin and wash.  Gently with a scrungie or clean washcloth. ? 5.  Apply the CHG Soap to your body ONLY FROM THE NECK DOWN.   Do   not use on face/ open      ?                     Wound or open sores. Avoid contact with eyes, ears mouth and   genitals (private parts).  ?                      Production manager,  Genitals (private parts) with your normal soap. ?            6.  Wash thoroughly, paying special attention to the area where your    surgery  will be performed. ? 7.  Thoroughly rinse your body with warm water from the neck down. ? 8.  DO NOT shower/wash with your normal soap after using and rinsing off the CHG Soap. ?               9.  Pat yourself dry with a clean towel. ?           10.  Wear clean pajamas. ?           11.  Place clean sheets on your bed the night of your first shower and do not  sleep with pets. ?Day of Surgery : ?Do not apply any lotions/deodorants the morning of surgery.  Please wear clean clothes to the hospital/surgery center. ? ?FAILURE TO FOLLOW THESE INSTRUCTIONS MAY RESULT IN THE CANCELLATION OF YOUR SURGERY ? ?PATIENT SIGNATURE_________________________________ ? ?NURSE SIGNATURE__________________________________ ? ?________________________________________________________________________  ?

## 2021-06-10 NOTE — Progress Notes (Addendum)
COVID Vaccine Completed:  Yes x2 ?Date COVID Vaccine completed:  09-14-19 10-21-19 ?Has received booster:Yes x1 ?COVID vaccine manufacturer: Pfizer     ? ?Date of COVID positive in last 90 days:  No ? ?PCP - [name], MD ?Cardiologist - Rudean Haskell, MD ? ?Chest x-ray - 08-08-20 Epic ?EKG - 08-28-20 Epic ?Stress Test - Unsure ?ECHO - 09-13-20 Epic ?Cardiac Cath - greater than 2 years ?Pacemaker/ICD device last checked: ?Spinal Cord Stimulator: ?Long Term Monitor - 2021 Epic ? ?Bowel Prep - Fleet enema.  Patient is aware ? ?Sleep Study - Yes, +sleep apnea ?CPAP - No longer uses ? ?Fasting Blood Sugar - N/A ?Checks Blood Sugar _____ times a day ? ?Blood Thinner Instructions:  N/A ?Aspirin Instructions: ?Last Dose: ? ?Activity level:   Can go up a flight of stairs and perform activities of daily living without stopping and without symptoms of chest pain or shortness of breath.  Some limitations due to foot pain. ?   ?Anesthesia review:  Dialysis, COPD, CHF, cardiomyopathy, HTN, OSA, cirrhosis, first degree heart block, PSVT ? ?Dialysis M-W-F ? ?Scheduled for blood transfusion next week on 06-18-21, patient stated hemoglobin in the 7s. ? ?Patient denies shortness of breath, fever, cough and chest pain at PAT appointment (completed over the phone) ? ?Patient verbalized understanding of instructions that were given to them at the PAT appointment. Patient was also instructed that they will need to review over the PAT instructions again at home before surgery.  ?

## 2021-06-11 ENCOUNTER — Ambulatory Visit (HOSPITAL_BASED_OUTPATIENT_CLINIC_OR_DEPARTMENT_OTHER): Payer: Medicare Other | Admitting: Anesthesiology

## 2021-06-11 ENCOUNTER — Ambulatory Visit (HOSPITAL_COMMUNITY): Payer: Medicare Other | Admitting: Anesthesiology

## 2021-06-11 ENCOUNTER — Ambulatory Visit (HOSPITAL_COMMUNITY)
Admission: RE | Admit: 2021-06-11 | Discharge: 2021-06-11 | Disposition: A | Discharge status: Home / Self Care | Payer: Medicare Other | Source: Ambulatory Visit | Attending: Urology | Admitting: Urology

## 2021-06-11 ENCOUNTER — Encounter (HOSPITAL_COMMUNITY)
Admission: RE | Disposition: A | Discharge status: Home / Self Care | Payer: Self-pay | Source: Ambulatory Visit | Attending: Urology

## 2021-06-11 ENCOUNTER — Encounter (HOSPITAL_COMMUNITY): Payer: Self-pay | Admitting: Urology

## 2021-06-11 DIAGNOSIS — D630 Anemia in neoplastic disease: Secondary | ICD-10-CM

## 2021-06-11 DIAGNOSIS — C61 Malignant neoplasm of prostate: Secondary | ICD-10-CM

## 2021-06-11 DIAGNOSIS — F17210 Nicotine dependence, cigarettes, uncomplicated: Secondary | ICD-10-CM | POA: Insufficient documentation

## 2021-06-11 DIAGNOSIS — Q612 Polycystic kidney, adult type: Secondary | ICD-10-CM | POA: Diagnosis not present

## 2021-06-11 DIAGNOSIS — G4730 Sleep apnea, unspecified: Secondary | ICD-10-CM | POA: Diagnosis not present

## 2021-06-11 DIAGNOSIS — E039 Hypothyroidism, unspecified: Secondary | ICD-10-CM | POA: Diagnosis not present

## 2021-06-11 DIAGNOSIS — I2510 Atherosclerotic heart disease of native coronary artery without angina pectoris: Secondary | ICD-10-CM

## 2021-06-11 DIAGNOSIS — K219 Gastro-esophageal reflux disease without esophagitis: Secondary | ICD-10-CM | POA: Insufficient documentation

## 2021-06-11 DIAGNOSIS — E1151 Type 2 diabetes mellitus with diabetic peripheral angiopathy without gangrene: Secondary | ICD-10-CM | POA: Diagnosis not present

## 2021-06-11 DIAGNOSIS — J449 Chronic obstructive pulmonary disease, unspecified: Secondary | ICD-10-CM | POA: Diagnosis not present

## 2021-06-11 DIAGNOSIS — F418 Other specified anxiety disorders: Secondary | ICD-10-CM | POA: Diagnosis not present

## 2021-06-11 DIAGNOSIS — K769 Liver disease, unspecified: Secondary | ICD-10-CM

## 2021-06-11 DIAGNOSIS — I132 Hypertensive heart and chronic kidney disease with heart failure and with stage 5 chronic kidney disease, or end stage renal disease: Secondary | ICD-10-CM | POA: Diagnosis not present

## 2021-06-11 DIAGNOSIS — I509 Heart failure, unspecified: Secondary | ICD-10-CM | POA: Diagnosis not present

## 2021-06-11 DIAGNOSIS — N186 End stage renal disease: Secondary | ICD-10-CM | POA: Insufficient documentation

## 2021-06-11 DIAGNOSIS — D649 Anemia, unspecified: Secondary | ICD-10-CM

## 2021-06-11 HISTORY — DX: Diaphragmatic hernia without obstruction or gangrene: K44.9

## 2021-06-11 HISTORY — DX: Personal history of other diseases of the circulatory system: Z86.79

## 2021-06-11 HISTORY — DX: Hypertensive chronic kidney disease with 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: I12.0

## 2021-06-11 HISTORY — DX: Atrioventricular block, first degree: I44.0

## 2021-06-11 HISTORY — DX: Malignant neoplasm of prostate: C61

## 2021-06-11 HISTORY — DX: Chronic kidney disease, unspecified: N18.9

## 2021-06-11 HISTORY — DX: Anemia in chronic kidney disease: D63.1

## 2021-06-11 HISTORY — DX: Personal history of adenomatous and serrated colon polyps: Z86.0101

## 2021-06-11 HISTORY — DX: Personal history of other specified conditions: Z87.898

## 2021-06-11 HISTORY — DX: Other general symptoms and signs: R68.89

## 2021-06-11 HISTORY — DX: Secondary and unspecified malignant neoplasm of lymph nodes of multiple regions: C77.8

## 2021-06-11 HISTORY — DX: Hypertensive heart disease without heart failure: I11.9

## 2021-06-11 HISTORY — DX: Personal history of colonic polyps: Z86.010

## 2021-06-11 HISTORY — DX: Presence of spectacles and contact lenses: Z97.3

## 2021-06-11 HISTORY — PX: SPACE OAR INSTILLATION: SHX6769

## 2021-06-11 HISTORY — PX: GOLD SEED IMPLANT: SHX6343

## 2021-06-11 LAB — CBC
HCT: 24.7 % — ABNORMAL LOW (ref 39.0–52.0)
Hemoglobin: 8 g/dL — ABNORMAL LOW (ref 13.0–17.0)
MCH: 33.2 pg (ref 26.0–34.0)
MCHC: 32.4 g/dL (ref 30.0–36.0)
MCV: 102.5 fL — ABNORMAL HIGH (ref 80.0–100.0)
Platelets: 226 10*3/uL (ref 150–400)
RBC: 2.41 MIL/uL — ABNORMAL LOW (ref 4.22–5.81)
RDW: 13.5 % (ref 11.5–15.5)
WBC: 5.4 10*3/uL (ref 4.0–10.5)
nRBC: 0 % (ref 0.0–0.2)

## 2021-06-11 LAB — COMPREHENSIVE METABOLIC PANEL
ALT: 14 U/L (ref 0–44)
AST: 34 U/L (ref 15–41)
Albumin: 3.6 g/dL (ref 3.5–5.0)
Alkaline Phosphatase: 61 U/L (ref 38–126)
Anion gap: 18 — ABNORMAL HIGH (ref 5–15)
BUN: 26 mg/dL — ABNORMAL HIGH (ref 6–20)
CO2: 22 mmol/L (ref 22–32)
Calcium: 10.6 mg/dL — ABNORMAL HIGH (ref 8.9–10.3)
Chloride: 97 mmol/L — ABNORMAL LOW (ref 98–111)
Creatinine, Ser: 8.88 mg/dL — ABNORMAL HIGH (ref 0.61–1.24)
GFR, Estimated: 6 mL/min — ABNORMAL LOW (ref >60)
Glucose, Bld: 86 mg/dL (ref 70–99)
Potassium: 5.7 mmol/L — ABNORMAL HIGH (ref 3.5–5.1)
Sodium: 137 mmol/L (ref 135–145)
Total Bilirubin: 1.9 mg/dL — ABNORMAL HIGH (ref 0.3–1.2)
Total Protein: 7.7 g/dL (ref 6.5–8.1)

## 2021-06-11 SURGERY — INSERTION, GOLD SEEDS
Anesthesia: Monitor Anesthesia Care

## 2021-06-11 MED ORDER — MIDAZOLAM HCL 5 MG/5ML IJ SOLN
INTRAMUSCULAR | Status: DC | PRN
  Administered 2021-06-11: 1 mg via INTRAVENOUS

## 2021-06-11 MED ORDER — FENTANYL CITRATE (PF) 100 MCG/2ML IJ SOLN
INTRAMUSCULAR | Status: DC | PRN
  Administered 2021-06-11 (×3): 25 ug via INTRAVENOUS

## 2021-06-11 MED ORDER — FENTANYL CITRATE PF 50 MCG/ML IJ SOSY: 25.0000 ug | PREFILLED_SYRINGE | INTRAMUSCULAR | Status: DC | PRN

## 2021-06-11 MED ORDER — SODIUM CHLORIDE (PF) 0.9 % IJ SOLN
INTRAMUSCULAR | Status: AC
  Filled 2021-06-11: qty 10

## 2021-06-11 MED ORDER — LACTATED RINGERS IV SOLN: INTRAVENOUS | Status: DC

## 2021-06-11 MED ORDER — LIDOCAINE HCL (PF) 1 % IJ SOLN
INTRAMUSCULAR | Status: DC | PRN
Start: 2021-06-11 — End: 2021-06-11
  Administered 2021-06-11: 10 mL

## 2021-06-11 MED ORDER — MIDAZOLAM HCL 2 MG/2ML IJ SOLN
INTRAMUSCULAR | Status: AC
  Filled 2021-06-11: qty 2

## 2021-06-11 MED ORDER — CEFAZOLIN SODIUM-DEXTROSE 2-4 GM/100ML-% IV SOLN
2.0000 g | INTRAVENOUS | Status: AC
  Administered 2021-06-11: 2 g via INTRAVENOUS
  Filled 2021-06-11: qty 100

## 2021-06-11 MED ORDER — DEXAMETHASONE SODIUM PHOSPHATE 10 MG/ML IJ SOLN
INTRAMUSCULAR | Status: AC
  Filled 2021-06-11: qty 1

## 2021-06-11 MED ORDER — SODIUM CHLORIDE 0.9 % IV SOLN: INTRAVENOUS | Status: DC

## 2021-06-11 MED ORDER — AMISULPRIDE (ANTIEMETIC) 5 MG/2ML IV SOLN: 10.0000 mg | Freq: Once | INTRAVENOUS | Status: DC | PRN

## 2021-06-11 MED ORDER — OXYCODONE HCL 5 MG PO TABS: 5.0000 mg | ORAL_TABLET | Freq: Once | ORAL | Status: DC | PRN

## 2021-06-11 MED ORDER — LIDOCAINE HCL (PF) 1 % IJ SOLN
INTRAMUSCULAR | Status: AC
  Filled 2021-06-11: qty 30

## 2021-06-11 MED ORDER — SODIUM CHLORIDE FLUSH 0.9 % IV SOLN
INTRAVENOUS | Status: DC | PRN
  Administered 2021-06-11: 5 mL

## 2021-06-11 MED ORDER — OXYCODONE HCL 5 MG/5ML PO SOLN: 5.0000 mg | Freq: Once | ORAL | Status: DC | PRN

## 2021-06-11 MED ORDER — ONDANSETRON HCL 4 MG/2ML IJ SOLN: 4.0000 mg | Freq: Once | INTRAMUSCULAR | Status: DC | PRN

## 2021-06-11 MED ORDER — ORAL CARE MOUTH RINSE: 15.0000 mL | Freq: Once | OROMUCOSAL | Status: AC

## 2021-06-11 MED ORDER — ONDANSETRON HCL 4 MG/2ML IJ SOLN
INTRAMUSCULAR | Status: AC
  Filled 2021-06-11: qty 2

## 2021-06-11 MED ORDER — PROPOFOL 500 MG/50ML IV EMUL
INTRAVENOUS | Status: DC | PRN
Start: 2021-06-11 — End: 2021-06-11
  Administered 2021-06-11 (×2): 10 mg via INTRAVENOUS
  Administered 2021-06-11: 30 mg via INTRAVENOUS
  Administered 2021-06-11: 10 mg via INTRAVENOUS
  Administered 2021-06-11: 40 mg via INTRAVENOUS
  Administered 2021-06-11 (×3): 10 mg via INTRAVENOUS

## 2021-06-11 MED ORDER — FENTANYL CITRATE (PF) 100 MCG/2ML IJ SOLN
INTRAMUSCULAR | Status: AC
  Filled 2021-06-11: qty 2

## 2021-06-11 MED ORDER — CHLORHEXIDINE GLUCONATE 0.12 % MT SOLN
15.0000 mL | Freq: Once | OROMUCOSAL | Status: AC
  Administered 2021-06-11: 15 mL via OROMUCOSAL

## 2021-06-11 MED ORDER — PROPOFOL 10 MG/ML IV BOLUS
INTRAVENOUS | Status: AC
  Filled 2021-06-11: qty 20

## 2021-06-11 MED ORDER — 0.9 % SODIUM CHLORIDE (POUR BTL) OPTIME
TOPICAL | Status: DC | PRN
Start: 2021-06-11 — End: 2021-06-11
  Administered 2021-06-11: 1000 mL

## 2021-06-11 SURGICAL SUPPLY — 15 items
DRSG TEGADERM 4X4.75 (GAUZE/BANDAGES/DRESSINGS) ×6 IMPLANT
DRSG TEGADERM 8X12 (GAUZE/BANDAGES/DRESSINGS) ×6 IMPLANT
GLOVE BIO SURGEON STRL SZ7.5 (GLOVE) ×3 IMPLANT
GLOVE ECLIPSE 8.0 STRL XLNG CF (GLOVE) ×3 IMPLANT
GOWN STRL REUS W/ TWL LRG LVL3 (GOWN DISPOSABLE) ×2 IMPLANT
GOWN STRL REUS W/TWL LRG LVL3 (GOWN DISPOSABLE) ×2
IMPL SPACEOAR VUE SYSTEM (Spacer) IMPLANT
IMPLANT SPACEOAR VUE SYSTEM (Spacer) ×2 IMPLANT
MARKER GOLD PRELOAD 1.2X3 (Urological Implant) ×2 IMPLANT
PACK CYSTO (CUSTOM PROCEDURE TRAY) ×3 IMPLANT
PAD PREP 24X48 CUFFED NSTRL (MISCELLANEOUS) ×3 IMPLANT
PENCIL SMOKE EVACUATOR (MISCELLANEOUS) IMPLANT
SEED GOLD PRELOAD 1.2X3 (Urological Implant) ×2 IMPLANT
SURGILUBE 2OZ TUBE FLIPTOP (MISCELLANEOUS) ×4 IMPLANT
UNDERPAD 30X36 HEAVY ABSORB (UNDERPADS AND DIAPERS) ×6 IMPLANT

## 2021-06-11 NOTE — Op Note (Addendum)
Operative Note ? ?Preoperative diagnosis:  ?1.  Prostate cancer ? ?Postoperative diagnosis: ?1.  Prostate cancer ? ?Procedure(s): ?1. Placement of fiducial markers into prostate ?2. Insertion of SpaceOAR hydrogel  ? ?Surgeon: Rexene Alberts, MD ? ?Assistants:  None ? ?Anesthesia:  General ? ?Complications:  None ? ?EBL:  Minimal ? ?Specimens: ?1. None ? ?Drains/Catheters: ?1.  None ? ?Indication:  James Wall is a 56 y.o. male with clinically localized prostate cancer. After discussing management options for treatment, he elected to proceed with radiotherapy. He presents today for the above procedures. The potential risks, complications, alternative options, and expected recovery course have been discussed in detail with the patient and he has provided informed consent to proceed. ? ?Description of procedure: ?The patient was administered preoperative antibiotics, placed in the dorsal lithotomy position, and prepped and draped in the usual sterile fashion. Next, transrectal ultrasonography was utilized to visualize the prostate. Three gold fiducial markers were then placed into the prostate via transperineal needles under ultrasound guidance at the right apex, right base, and left mid gland under direct ultrasound guidance. A site in the midline was then selected on the perineum for placement of an 18 g needle with saline. The needle was advanced above the rectum and below Denonvillier's fascia to the mid gland and confirmed to be in the midline on transverse imaging. One cc of saline was injected confirming appropriate expansion of this space. A total of 5 cc of saline was then injected to open the space further bilaterally. The saline syringe was then removed and the SpaceOAR hydrogel was injected with good distribution bilaterally. He tolerated the procedure well and without complications.  ? ?James R. Adaliah Hiegel MD ?[facility]  ?Pager: [phone] ? ?

## 2021-06-11 NOTE — Transfer of Care (Signed)
Immediate Anesthesia Transfer of Care Note ? ?Patient: James Wall ? ?Procedure(s) Performed: Procedure(s): ?GOLD SEED IMPLANT (N/A) ?SPACE OAR INSTILLATION (N/A) ? ?Patient Location: PACU  ? ?Anesthesia Type:MAC ? ?Level of Consciousness: awake, alert  and oriented ? ?Airway & Oxygen Therapy: Patient Spontanous Breathing and Patient connected to nasal cannula oxygen ? ?Post-op Assessment: Report given to RN and Post -op Vital signs reviewed and stable ? ?Post vital signs: Reviewed and stable ? ?Last Vitals:  ?Vitals:  ? 06/11/21 1253  ?BP: (!) 186/99  ?Pulse: 64  ?Resp: 15  ?Temp: 36.8 ?C  ?SpO2: 98%  ? ? ?Complications: No apparent anesthesia complications ? ?

## 2021-06-11 NOTE — Anesthesia Preprocedure Evaluation (Signed)
Anesthesia Evaluation  ?Patient identified by MRN, date of birth, ID band ?Patient awake ? ? ? ?Reviewed: ?Allergy & Precautions, NPO status , Patient's Chart, lab work & pertinent test results ? ?History of Anesthesia Complications ?Negative for: history of anesthetic complications ? ?Airway ?Mallampati: III ? ?TM Distance: >3 FB ?Neck ROM: Full ? ? ? Dental ? ?(+) Teeth Intact ?  ?Pulmonary ?sleep apnea , COPD, Current Smoker,  ?  ?Pulmonary exam normal ? ? ? ? ? ? ? Cardiovascular ?hypertension, + Peripheral Vascular Disease and +CHF  ?Normal cardiovascular exam ? ? ?Echo 09/13/20: EF 55-60%, no RWMA, mod LVH, g1dd, normal RVSF, mild pulm HTN, severe LAE, mild MR, mild AV sclerosis w/o stenosis ?  ?Neuro/Psych ?Anxiety Depression negative neurological ROS ?   ? GI/Hepatic ?GERD  ,(+) Cirrhosis  ?  ?  ? ,   ?Endo/Other  ?diabetes, Type 2Hypothyroidism  ? Renal/GU ?Dialysis and ESRFRenal disease  ? ?Prostate cancer ? ?  ?Musculoskeletal ?negative musculoskeletal ROS ?(+)  ? Abdominal ?  ?Peds ? Hematology ? ?(+) Blood dyscrasia, anemia ,   ?Anesthesia Other Findings ? ? Reproductive/Obstetrics ? ?  ? ? ? ? ? ? ? ? ? ? ? ? ? ?  ?  ? ? ? ? ? ?Anesthesia Physical ?Anesthesia Plan ? ?ASA: 3 ? ?Anesthesia Plan: MAC  ? ?Post-op Pain Management: Minimal or no pain anticipated  ? ?Induction: Intravenous ? ?PONV Risk Score and Plan: 1 and Propofol infusion, TIVA and Treatment may vary due to age or medical condition ? ?Airway Management Planned: Natural Airway, Nasal Cannula and Simple Face Mask ? ?Additional Equipment: None ? ?Intra-op Plan:  ? ?Post-operative Plan:  ? ?Informed Consent: I have reviewed the patients History and Physical, chart, labs and discussed the procedure including the risks, benefits and alternatives for the proposed anesthesia with the patient or authorized representative who has indicated his/her understanding and acceptance.  ? ? ? ? ? ?Plan Discussed with:   ? ?Anesthesia Plan Comments:   ? ? ? ? ? ?Anesthesia Quick Evaluation ? ?

## 2021-06-11 NOTE — H&P (Signed)
?CC/HPI: 1 - Metastatic Prostate Cancer -  ?Initial DX: 12/12 cores up to 95% Grade 3 cancer on BX 2023  ?Primary TX: External beam radiatio + androgen deprivation at least 2 years (starting 05/2021) at age 56  ?Most Recent Restaging: 2/203 PET - bilateral <2cm pelvic node mets and small left bladder met  ? ?Recent Course:  ?05/2021 - Eligard 24;  ? ? ?2 - End Stage Renal Disease / Polycystic Kidney Disease - on dialysis via [facility] [place] vas cath (numerous fisculas prior). managed by [facility] (not one provder at present). PET 2023 with innumerabl bilaeral renal cysts <2cm, atrophic kidneys, no uptake / enhancement. No h/o significant hematuria.  ? ?PMH sig for HTN. NO ischemic CV disease / blood thinners. Used to be Freight forwarder for food services at [employer], went to [employer]. His friend Junita Push is very involved. His PCP is [name] MD with [facility].  ? ?Today " James Wall " is seen as new patient for above. He is in midst of mulitmodal primary therapy for metastatic prostate cancer, has SPACE-OAR and fiducials pendign for next week with Dr. Abner Greenspan.  ? ?  ?ALLERGIES: None  ? ?MEDICATIONS: Omeprazole  ?Tamsulosin Hcl 0.4 mg capsule  ?Clonazepam  ?Hydralazine Hcl 25 mg tablet  ?  ? ?GU PSH: Prostate Needle Biopsy - 02/21/2021 ? ?  ? ?NON-GU PSH: Surgical Pathology, Gross And Microscopic Examination For Prostate Needle - 02/21/2021 ? ?  ? ?GU PMH: Prostate Cancer - May 28, 2021 ?  ?   ?PMH Notes: Liver disease, kidney failure  ? ?NON-GU PMH: Metastatic lymphadenopathy of multiple regions - 05/28/21 ?  ? ?FAMILY HISTORY: Death In The Family Father - Other ?Death In The Family Mother - Other  ? ?SOCIAL HISTORY: Marital Status: Single ?Preferred Language: [place]; Ethnicity: Not Hispanic Or Latino; Race: Black or African American ?Current Smoking Status: Patient smokes occasionally. Has smoked since 12/05/1994. Smokes 1/2 pack per day.  ? ?Tobacco Use Assessment Completed: Used Tobacco in last 30 days? ?Does not use  smokeless tobacco. ?Has never drank.  ?Does not use drugs. ?Drinks 3 caffeinated drinks per day. ?  ? ?REVIEW OF SYSTEMS:    ?GU Review Male:   Patient denies frequent urination, hard to postpone urination, burning/ pain with urination, get up at night to urinate, leakage of urine, stream starts and stops, trouble starting your stream, have to strain to urinate , erection problems, and penile pain.  ?Gastrointestinal (Upper):   Patient denies vomiting, nausea, and indigestion/ heartburn.  ?Gastrointestinal (Lower):   Patient denies diarrhea and constipation.  ?Constitutional:   Patient denies fever, night sweats, weight loss, and fatigue.  ?Skin:   Patient denies skin rash/ lesion and itching.  ?Eyes:   Patient denies blurred vision and double vision.  ?Ears/ Nose/ Throat:   Patient denies sore throat and sinus problems.  ?Hematologic/Lymphatic:   Patient denies swollen glands and easy bruising.  ?Cardiovascular:   Patient denies leg swelling and chest pains.  ?Respiratory:   Patient denies cough and shortness of breath.  ?Endocrine:   Patient denies excessive thirst.  ?Musculoskeletal:   Patient denies back pain and joint pain.  ?Neurological:   Patient denies headaches and dizziness.  ?Psychologic:   Patient denies depression and anxiety.  ? ?VITAL SIGNS: None  ? ?GU PHYSICAL EXAMINATION:    ?Testes: No tenderness, no swelling, no enlargement left testes. No tenderness, no swelling, no enlargement right testes. Normal location left testes. Normal location right testes. No mass, no  cyst, no varicocele, no hydrocele left testes. No mass, no cyst, no varicocele, no hydrocele right testes.  ?Penis: Circumcised, no warts, no cracks. No dorsal Peyronie's plaques, no left corporal Peyronie's plaques, no right corporal Peyronie's plaques, no scarring, no warts. No balanitis, no meatal stenosis.  ?Sphincter Tone: Normal sphincter. No rectal tenderness. No rectal mass.   ? ?MULTI-SYSTEM PHYSICAL EXAMINATION:     ?Constitutional: Well-nourished. No physical deformities. Normally developed. Good grooming.  ?Neck: Neck symmetrical, not swollen. Normal tracheal position.  ?Respiratory: No labored breathing, no use of accessory muscles.   ?Cardiovascular: Normal temperature, normal extremity pulses, no swelling, no varicosities.  ?Lymphatic: No enlargement of neck, axillae, groin.  ?Skin: No paleness, no jaundice, no cyanosis. No lesion, no ulcer, no rash.  ?Neurologic / Psychiatric: Oriented to time, oriented to place, oriented to person. No depression, no anxiety, no agitation.  ?Gastrointestinal: No mass, no tenderness, no rigidity, moderate trucnal obesity.   ?Eyes: Normal conjunctivae. Normal eyelids.  ?Ears, Nose, Mouth, and Throat: Left ear no scars, no lesions, no masses. Right ear no scars, no lesions, no masses. Nose no scars, no lesions, no masses. Normal hearing. Normal lips.  ?Musculoskeletal: Normal gait and station of head and neck.  ? ?  ?Complexity of Data:  ?Lab Test Review:   PSA  ?Records Review:   Pathology Reports, Previous Patient Records  ?Urine Test Review:   Urinalysis  ?X-Ray Review: PET Scan: Reviewed Films. Reviewed Report. Discussed With Patient.  ?  ? 05/02/21 01/01/21 12/06/20  ?PSA  ?Total PSA 1.83 ng/mL 32.00 ng/mL 36.30 ng/mL  ? ? 05/02/21  ?Hormones  ?Testosterone, Total <10 ng/dL  ? ? ?PROCEDURES: None  ? ?ASSESSMENT:  ?    ICD-10 Details  ?1 GU:   Prostate Cancer - C61 Chronic, Threat to Bodily Function  ?3   End-Stage Renal Disease - N18.6 Chronic, Threat to Bodily Function  ?2 NON-GU:   Metastatic lymphadenopathy of multiple regions - C77.8 Chronic, Threat to Bodily Function  ?4   Polycystic kidney, adult type - Q61.2 Bilateral, Chronic, Threat to Bodily Function  ? ?PLAN:    ? ?      Schedule ?Return Visit/Planned Activity: 3 Months - [facility]  ?           Note: James Wall  ? ? ?      Document ?Letter(s):  Created for Patient: Clinical Summary  ? ? ?     Notes:   Agree with multimodal  primary therapy given his younger age despite metastatic disease. At this point, would hold off non-steroidal anti-androgens as he will be battling this for the rest of his life and favor keeping some tools for later time. Also would like to see how he truly reponds to the primary therapy alone.  ? ?If he ever did wing up having major lower tract problems (retention, or issues with bleeding PCKD, could actually consider radical GU-ectomy as already dialysis dependant, but this would of course be last resort).  ? ?RTC 41mo with PSA, T. Can certainly get with dialysis as well, he was given order form for this.  ? ?CC: MGari CrownMD, rad-onc  ?CC: VGifford ShaveMD  ? ?Urology Preoperative H&P  ? ?Chief Complaint: prostate cancer ? ?History of Present Illness: James Shomakeris a 56y.o. male with prostate cancer here for fiducial marker placement and space oar injection. Denies fevers, chills, dysuria. ? ?Past Medical History:  ?Diagnosis Date  ? 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 primary cancer is prostate  ? Anemia associated with chronic renal failure   ? Anxiety   ? Arthritis   ? Cirrhosis, nonalcoholic ([place])   ? per pt possible from a medication , unsure ;   last ultrasound 08-09-2020 in epic no fibrosis  ? COPD (chronic obstructive pulmonary disease) ([place])   ? Depression   ? ESRD on hemodialysis [facility]) 2009  ? 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 on MWF  ? First degree heart block   ? GERD (gastroesophageal reflux disease)   ? Hiatal hernia   ? History of acute respiratory failure 07/2013  ? admission;  HCAP w/ ARF with hypoxia  ? History of adenomatous polyp of colon   ? History of ascites   ? s/p paracentesis 01-31-2013 (5L) and last one 03-28-2013 (2.7L)  ? History of community acquired pneumonia 08/08/2020  ? admission ; LLL , POA  ? History of heart murmur in childhood   ? History  of MRSA infection 12/19/2012  ? hospital admission;  w/ sepsis MRSA bacteramia AVF infection  ? History of sepsis 03/2017  ? admission;   HCAP w/ sepsis  ? Hyperlipidemia   ? Hypertension   ? Hypertensive heart disease   ? car

## 2021-06-11 NOTE — Anesthesia Postprocedure Evaluation (Signed)
Anesthesia Post Note ? ?Patient: James Wall ? ?Procedure(s) Performed: GOLD SEED IMPLANT ?SPACE OAR INSTILLATION ? ?  ? ?Patient location during evaluation: PACU ?Anesthesia Type: MAC ?Level of consciousness: awake and alert ?Pain management: pain level controlled ?Vital Signs Assessment: post-procedure vital signs reviewed and stable ?Respiratory status: spontaneous breathing, nonlabored ventilation and respiratory function stable ?Cardiovascular status: blood pressure returned to baseline and stable ?Postop Assessment: no apparent nausea or vomiting ?Anesthetic complications: no ? ? ?No notable events documented. ? ?Last Vitals:  ?Vitals:  ? 06/11/21 1600 06/11/21 1615  ?BP: (!) 180/104 (!) 185/104  ?Pulse: 64 (!) 58  ?Resp:  20  ?Temp:  37.1 ?C  ?SpO2:  100%  ?  ?Last Pain:  ?Vitals:  ? 06/11/21 1615  ?TempSrc:   ?PainSc: 0-No pain  ? ? ?  ?  ?  ?  ?  ?  ? ?Lidia Collum ? ? ? ? ?

## 2021-06-11 NOTE — Discharge Instructions (Signed)
Activity:  You are encouraged to ambulate frequently (about every hour during waking hours) to help prevent blood clots from forming in your legs or lungs.   ? ?Diet: You should advance your diet as instructed by your physician.  It will be normal to have some bloating, nausea, and abdominal discomfort intermittently. ? ?Prescriptions:  You will be provided a prescription for pain medication to take as needed.  If your pain is not severe enough to require the prescription pain medication, you may take extra strength Tylenol instead which will have less side effects.  You should also take a prescribed stool softener to avoid straining with bowel movements as the prescription pain medication may constipate you. ? ?What to call us about: You should call the office [phone]) if you develop fever > 101 or develop persistent vomiting. Activity:  You are encouraged to ambulate frequently (about every hour during waking hours) to help prevent blood clots from forming in your legs or lungs.  ?

## 2021-06-12 ENCOUNTER — Encounter (HOSPITAL_COMMUNITY): Payer: Self-pay | Admitting: Urology

## 2021-06-12 ENCOUNTER — Telehealth: Payer: Self-pay | Admitting: *Deleted

## 2021-06-12 DIAGNOSIS — Z992 Dependence on renal dialysis: Secondary | ICD-10-CM | POA: Diagnosis not present

## 2021-06-12 DIAGNOSIS — N186 End stage renal disease: Secondary | ICD-10-CM | POA: Diagnosis not present

## 2021-06-12 DIAGNOSIS — N2581 Secondary hyperparathyroidism of renal origin: Secondary | ICD-10-CM | POA: Diagnosis not present

## 2021-06-12 DIAGNOSIS — D509 Iron deficiency anemia, unspecified: Secondary | ICD-10-CM | POA: Diagnosis not present

## 2021-06-12 DIAGNOSIS — J158 Pneumonia due to other specified bacteria: Secondary | ICD-10-CM | POA: Diagnosis not present

## 2021-06-12 DIAGNOSIS — D631 Anemia in chronic kidney disease: Secondary | ICD-10-CM | POA: Diagnosis not present

## 2021-06-12 DIAGNOSIS — R972 Elevated prostate specific antigen [PSA]: Secondary | ICD-10-CM | POA: Insufficient documentation

## 2021-06-12 NOTE — Telephone Encounter (Signed)
Called patient to remind of sim appt. for 06-13-21, patient opted to cancel due to his ride and has rescheduled for 06-20-21- arrival time- 1:15 pm @ [place] ?

## 2021-06-13 ENCOUNTER — Ambulatory Visit: Payer: Medicare Other | Admitting: Radiation Oncology

## 2021-06-14 DIAGNOSIS — D509 Iron deficiency anemia, unspecified: Secondary | ICD-10-CM | POA: Diagnosis not present

## 2021-06-14 DIAGNOSIS — J158 Pneumonia due to other specified bacteria: Secondary | ICD-10-CM | POA: Diagnosis not present

## 2021-06-14 DIAGNOSIS — N186 End stage renal disease: Secondary | ICD-10-CM | POA: Diagnosis not present

## 2021-06-14 DIAGNOSIS — D631 Anemia in chronic kidney disease: Secondary | ICD-10-CM | POA: Diagnosis not present

## 2021-06-14 DIAGNOSIS — Z992 Dependence on renal dialysis: Secondary | ICD-10-CM | POA: Diagnosis not present

## 2021-06-14 DIAGNOSIS — N2581 Secondary hyperparathyroidism of renal origin: Secondary | ICD-10-CM | POA: Diagnosis not present

## 2021-06-17 DIAGNOSIS — N186 End stage renal disease: Secondary | ICD-10-CM | POA: Diagnosis not present

## 2021-06-17 DIAGNOSIS — J158 Pneumonia due to other specified bacteria: Secondary | ICD-10-CM | POA: Diagnosis not present

## 2021-06-17 DIAGNOSIS — N2581 Secondary hyperparathyroidism of renal origin: Secondary | ICD-10-CM | POA: Diagnosis not present

## 2021-06-17 DIAGNOSIS — D509 Iron deficiency anemia, unspecified: Secondary | ICD-10-CM | POA: Diagnosis not present

## 2021-06-17 DIAGNOSIS — D631 Anemia in chronic kidney disease: Secondary | ICD-10-CM | POA: Diagnosis not present

## 2021-06-17 DIAGNOSIS — Z992 Dependence on renal dialysis: Secondary | ICD-10-CM | POA: Diagnosis not present

## 2021-06-18 ENCOUNTER — Encounter (HOSPITAL_COMMUNITY): Payer: Medicare Other

## 2021-06-18 ENCOUNTER — Ambulatory Visit: Payer: Medicare Other

## 2021-06-18 NOTE — Patient Instructions (Signed)
Visit Information ? ?Mr. Mannina  it was nice speaking with you. Please call me directly [phone] if you have questions about the goals we discussed. ? ?Patient Goals/Self Care Activities: ?-Patient/Caregiver will self-administer medications as prescribed as evidenced by self-report/primary caregiver report  ?-Patient/Caregiver will attend all scheduled provider appointments as evidenced by clinician review of documented attendance to scheduled appointments and patient/caregiver report ?-Patient/Caregiver will call pharmacy for medication refills as evidenced by patient report and review of pharmacy fill history as appropriate ?-Patient/Caregiver will call provider office for new concerns or questions as evidenced by review of documented incoming telephone call notes and patient report ?-Patient/Caregiver verbalizes understanding of plan ?-Patient/Caregiver will focus on medication adherence by taking medications as prescribed  ?-Calls provider office for new concerns, questions, or BP outside discussed parameters ?-Checks BP and records as discussed ?-Follows a low sodium diet/DASH diet  ?  ?  ? ? ?Patient verbalizes understanding of instructions and care plan provided today and agrees to view in [place]. Active MyChart status confirmed with patient.   ? ?Follow up Plan: Patient would like continued follow-up.  CCM RNCM will outreach the patient within the next 5 weeks.  Patient will call office if needed prior to next encounter ? ?Lazaro Arms, RN ? ?[phone]  ?

## 2021-06-18 NOTE — Chronic Care Management (AMB) (Signed)
?Chronic Care Management  ? ?CCM RN Visit Note ? ?06/18/2021 ?Name: James Wall MRN: [id number] DOB: Jan 10, 1966 ? ?Subjective: ?James Wall is a 56 y.o. year old male who is a primary care patient of [name], MD. The care management team was consulted for assistance with disease management and care coordination needs.   ? ?Engaged with patient by telephone for follow up visit in response to provider referral for case management and/or care coordination services.  ? ?Consent to Services:  ?The patient was given information about Chronic Care Management services, agreed to services, and gave verbal consent prior to initiation of services.  Please see initial visit note for detailed documentation.  ? ?Patient agreed to services and verbal consent obtained.  ? ? ?Summary:  The patient is dealing with elevated blood pressures due do drinking sodas and Gatorade.  He also states that he has been having pain in his feet and plans to see a podiatrist because the insert that he has purchased is not helping. . See Care Plan below for interventions and patient self-care actives. ? ?Recommendation: The patient may benefit from taking medications as prescribed, monitoring food intake, watch fluid intake, checking blood pressures and record values, and The patient agrees. ? ?Follow up Plan: Patient would like continued follow-up.  CCM RNCM will outreach the patient within the next 5 weeks.  Patient will call office if needed prior to next encounter ? ? ?Assessment: Review of patient past medical history, allergies, medications, health status, including review of consultants reports, laboratory and other test data, was performed as part of comprehensive evaluation and provision of chronic care management services.  ? ?SDOH (Social Determinants of Health) assessments and interventions performed:  No ? ?CCM Care Plan ? ?Conditions to be addressed/monitored:HTN ? ?Care Plan : RN Case Manager  ?Updates made by  Lazaro Arms, RN since 06/18/2021 12:00 AM  ?  ? ?Problem: General Plan of Care in a patiednt with Hypertention   ?  ? ?Goal: The patient will learn to monitor and manage his blood pressure through lifestyle modigifcations to prevet further complications   ?Start Date: 06/04/2021  ?Expected End Date: 06/05/2022  ?Priority: High  ?Note:   ?Current Barriers:  ?Chronic Disease Management support and education needs related to HTN ? ?RNCM Clinical Goal(s):  ?Patient will verbalize basic understanding of HTN disease process and self health management plan as evidenced by keeping his blood pressure within acceptable levels  through collaboration with RN Care manager, provider, and care team.  ? ?Interventions: ?1:1 collaboration with primary care provider regarding development and update of comprehensive plan of care as evidenced by provider attestation and co-signature ?Inter-disciplinary care team collaboration (see longitudinal plan of care) ?Evaluation of current treatment plan related to  self management and patient's adherence to plan as established by provider ? ? ?Hypertension: (Status: New goal.) Long Term Goal  ?Last practice recorded BP readings:  ?BP Readings from Last 3 Encounters:  ?06/11/21 (!) 176/96  ?03/21/21 (!) 166/98  ?11/01/20 122/74  ?Most recent eGFR/CrCl: No results found for: EGFR  No components found for: CRCL ? ?Evaluation of current treatment plan related to hypertension self management and patient's adherence to plan as established by provider;   ?Discussed plans with patient for ongoing care management follow up and provided patient with direct contact information for care management team; ?Advised patient, providing education and rationale, to monitor blood pressure daily and record, calling PCP for findings outside established parameters;  ?06/18/21:  During our  conversation, Mr. Kaser said his situation had mainly stayed the same. He confirmed receiving the hypertension information I sent  him. He mentioned that he has been getting his blood pressure checked only during dialysis, and the readings have been high. He stopped drinking sodas to lower his blood pressure but switched to Gatorade, which did not help as it also contained sodium. I advised him to try to drink more water. ? ? ?Patient Goals/Self Care Activities: ?-Patient/Caregiver will self-administer medications as prescribed as evidenced by self-report/primary caregiver report  ?-Patient/Caregiver will attend all scheduled provider appointments as evidenced by clinician review of documented attendance to scheduled appointments and patient/caregiver report ?-Patient/Caregiver will call pharmacy for medication refills as evidenced by patient report and review of pharmacy fill history as appropriate ?-Patient/Caregiver will call provider office for new concerns or questions as evidenced by review of documented incoming telephone call notes and patient report ?-Patient/Caregiver verbalizes understanding of plan ?-Patient/Caregiver will focus on medication adherence by taking medications as prescribed  ?-Calls provider office for new concerns, questions, or BP outside discussed parameters ?-Checks BP and records as discussed ?-Follows a low sodium diet/DASH diet  ?  ? ?  ? ?Lazaro Arms RN, BSN, [place] ?Care Management Coordinator ?[place]  ?Phone: [phone]  ?  ? ? ? ? ? ? ? ?

## 2021-06-19 ENCOUNTER — Telehealth: Payer: Self-pay | Admitting: *Deleted

## 2021-06-19 DIAGNOSIS — N2581 Secondary hyperparathyroidism of renal origin: Secondary | ICD-10-CM | POA: Diagnosis not present

## 2021-06-19 DIAGNOSIS — J158 Pneumonia due to other specified bacteria: Secondary | ICD-10-CM | POA: Diagnosis not present

## 2021-06-19 DIAGNOSIS — D631 Anemia in chronic kidney disease: Secondary | ICD-10-CM | POA: Diagnosis not present

## 2021-06-19 DIAGNOSIS — Z992 Dependence on renal dialysis: Secondary | ICD-10-CM | POA: Diagnosis not present

## 2021-06-19 DIAGNOSIS — N186 End stage renal disease: Secondary | ICD-10-CM | POA: Diagnosis not present

## 2021-06-19 DIAGNOSIS — D509 Iron deficiency anemia, unspecified: Secondary | ICD-10-CM | POA: Diagnosis not present

## 2021-06-19 NOTE — Telephone Encounter (Signed)
CALLED PATIENT TO REMIND OF SIM APPT. FOR 06-20-21- ARRIVAL TIME- 1:15 PM @ [facility], INFORMED PATIENT TO ARRIVE WITH A FULL BLADDER AND AN EMPTY BLADDER, LVM FOR A RETURN CALL ?

## 2021-06-20 ENCOUNTER — Other Ambulatory Visit: Payer: Self-pay

## 2021-06-20 ENCOUNTER — Ambulatory Visit
Admission: RE | Admit: 2021-06-20 | Discharge: 2021-06-20 | Disposition: A | Discharge status: Home / Self Care | Payer: Medicare Other | Source: Ambulatory Visit | Attending: Radiation Oncology | Admitting: Radiation Oncology

## 2021-06-20 DIAGNOSIS — C61 Malignant neoplasm of prostate: Secondary | ICD-10-CM | POA: Diagnosis not present

## 2021-06-20 DIAGNOSIS — Z510 Encounter for antineoplastic radiation therapy: Secondary | ICD-10-CM | POA: Diagnosis not present

## 2021-06-20 DIAGNOSIS — Z191 Hormone sensitive malignancy status: Secondary | ICD-10-CM | POA: Diagnosis not present

## 2021-06-20 NOTE — Progress Notes (Signed)
  Radiation Oncology         [phone] ________________________________  Name: Parley Pidcock MRN: [id number]  Date: 06/20/2021  DOB: [birth year]  SIMULATION AND TREATMENT PLANNING NOTE    ICD-10-CM   1. Malignant neoplasm of prostate ([place])  C61       DIAGNOSIS:  56 y.o. 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  NARRATIVE:  The patient was brought to the [facility].  Identity was confirmed.  All relevant records and images related to the planned course of therapy were reviewed.  The patient freely provided informed written consent to proceed with treatment after reviewing the details related to the planned course of therapy. The consent form was witnessed and verified by the simulation staff.  Then, the patient was set-up in a stable reproducible supine position for radiation therapy.  A vacuum lock pillow device was custom fabricated to position his legs in a reproducible immobilized position.  Then, I performed a urethrogram under sterile conditions to identify the prostatic apex.  CT images were obtained.  Surface markings were placed.  The CT images were loaded into the planning software.  Then the prostate target and avoidance structures including the rectum, bladder, bowel and hips were contoured.  Treatment planning then occurred.  The radiation prescription was entered and confirmed.  A total of one complex treatment devices was fabricated. I have requested : Intensity Modulated Radiotherapy (IMRT) is medically necessary for this case for the following reason:  Rectal sparing.Marland Kitchen  PLAN:  The prostate, seminal vesicles, and pelvic lymph nodes will initially be treated to 45 Gy in 25 fractions of 1.8 Gy followed by a boost, to the prostate only, to 75 Gy with 15 additional fractions of 2.0 Gy.   ________________________________  Sheral Apley Tammi Klippel, M.D.

## 2021-06-21 DIAGNOSIS — N186 End stage renal disease: Secondary | ICD-10-CM | POA: Diagnosis not present

## 2021-06-21 DIAGNOSIS — J158 Pneumonia due to other specified bacteria: Secondary | ICD-10-CM | POA: Diagnosis not present

## 2021-06-21 DIAGNOSIS — D631 Anemia in chronic kidney disease: Secondary | ICD-10-CM | POA: Diagnosis not present

## 2021-06-21 DIAGNOSIS — Z992 Dependence on renal dialysis: Secondary | ICD-10-CM | POA: Diagnosis not present

## 2021-06-21 DIAGNOSIS — N2581 Secondary hyperparathyroidism of renal origin: Secondary | ICD-10-CM | POA: Diagnosis not present

## 2021-06-21 DIAGNOSIS — D509 Iron deficiency anemia, unspecified: Secondary | ICD-10-CM | POA: Diagnosis not present

## 2021-06-24 DIAGNOSIS — J158 Pneumonia due to other specified bacteria: Secondary | ICD-10-CM | POA: Diagnosis not present

## 2021-06-24 DIAGNOSIS — D631 Anemia in chronic kidney disease: Secondary | ICD-10-CM | POA: Diagnosis not present

## 2021-06-24 DIAGNOSIS — Z992 Dependence on renal dialysis: Secondary | ICD-10-CM | POA: Diagnosis not present

## 2021-06-24 DIAGNOSIS — D509 Iron deficiency anemia, unspecified: Secondary | ICD-10-CM | POA: Diagnosis not present

## 2021-06-24 DIAGNOSIS — N186 End stage renal disease: Secondary | ICD-10-CM | POA: Diagnosis not present

## 2021-06-24 DIAGNOSIS — N2581 Secondary hyperparathyroidism of renal origin: Secondary | ICD-10-CM | POA: Diagnosis not present

## 2021-06-26 DIAGNOSIS — C61 Malignant neoplasm of prostate: Secondary | ICD-10-CM | POA: Diagnosis not present

## 2021-06-26 DIAGNOSIS — D509 Iron deficiency anemia, unspecified: Secondary | ICD-10-CM | POA: Diagnosis not present

## 2021-06-26 DIAGNOSIS — Z510 Encounter for antineoplastic radiation therapy: Secondary | ICD-10-CM | POA: Diagnosis not present

## 2021-06-26 DIAGNOSIS — Z191 Hormone sensitive malignancy status: Secondary | ICD-10-CM | POA: Diagnosis not present

## 2021-06-26 DIAGNOSIS — N2581 Secondary hyperparathyroidism of renal origin: Secondary | ICD-10-CM | POA: Diagnosis not present

## 2021-06-26 DIAGNOSIS — N186 End stage renal disease: Secondary | ICD-10-CM | POA: Diagnosis not present

## 2021-06-26 DIAGNOSIS — D631 Anemia in chronic kidney disease: Secondary | ICD-10-CM | POA: Diagnosis not present

## 2021-06-26 DIAGNOSIS — J158 Pneumonia due to other specified bacteria: Secondary | ICD-10-CM | POA: Diagnosis not present

## 2021-06-26 DIAGNOSIS — Z992 Dependence on renal dialysis: Secondary | ICD-10-CM | POA: Diagnosis not present

## 2021-06-27 DIAGNOSIS — C61 Malignant neoplasm of prostate: Secondary | ICD-10-CM | POA: Diagnosis not present

## 2021-06-27 DIAGNOSIS — Z191 Hormone sensitive malignancy status: Secondary | ICD-10-CM | POA: Diagnosis not present

## 2021-06-28 DIAGNOSIS — Z992 Dependence on renal dialysis: Secondary | ICD-10-CM | POA: Diagnosis not present

## 2021-06-28 DIAGNOSIS — N2581 Secondary hyperparathyroidism of renal origin: Secondary | ICD-10-CM | POA: Diagnosis not present

## 2021-06-28 DIAGNOSIS — N186 End stage renal disease: Secondary | ICD-10-CM | POA: Diagnosis not present

## 2021-06-28 DIAGNOSIS — J158 Pneumonia due to other specified bacteria: Secondary | ICD-10-CM | POA: Diagnosis not present

## 2021-06-28 DIAGNOSIS — D509 Iron deficiency anemia, unspecified: Secondary | ICD-10-CM | POA: Diagnosis not present

## 2021-06-28 DIAGNOSIS — D631 Anemia in chronic kidney disease: Secondary | ICD-10-CM | POA: Diagnosis not present

## 2021-07-01 ENCOUNTER — Other Ambulatory Visit: Payer: Self-pay

## 2021-07-01 ENCOUNTER — Emergency Department (HOSPITAL_COMMUNITY)
Admission: EM | Admit: 2021-07-01 | Discharge: 2021-07-01 | Disposition: A | Discharge status: Home / Self Care | Payer: Medicare Other | Attending: Emergency Medicine | Admitting: Emergency Medicine

## 2021-07-01 DIAGNOSIS — D631 Anemia in chronic kidney disease: Secondary | ICD-10-CM | POA: Insufficient documentation

## 2021-07-01 DIAGNOSIS — Z79899 Other long term (current) drug therapy: Secondary | ICD-10-CM | POA: Insufficient documentation

## 2021-07-01 DIAGNOSIS — Z992 Dependence on renal dialysis: Secondary | ICD-10-CM | POA: Insufficient documentation

## 2021-07-01 DIAGNOSIS — N2581 Secondary hyperparathyroidism of renal origin: Secondary | ICD-10-CM | POA: Diagnosis not present

## 2021-07-01 DIAGNOSIS — J449 Chronic obstructive pulmonary disease, unspecified: Secondary | ICD-10-CM | POA: Insufficient documentation

## 2021-07-01 DIAGNOSIS — N186 End stage renal disease: Secondary | ICD-10-CM | POA: Insufficient documentation

## 2021-07-01 DIAGNOSIS — D649 Anemia, unspecified: Secondary | ICD-10-CM | POA: Insufficient documentation

## 2021-07-01 DIAGNOSIS — Z8546 Personal history of malignant neoplasm of prostate: Secondary | ICD-10-CM | POA: Diagnosis not present

## 2021-07-01 DIAGNOSIS — R799 Abnormal finding of blood chemistry, unspecified: Secondary | ICD-10-CM | POA: Diagnosis present

## 2021-07-01 DIAGNOSIS — I12 Hypertensive chronic kidney disease with 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: Secondary | ICD-10-CM | POA: Insufficient documentation

## 2021-07-01 DIAGNOSIS — D509 Iron deficiency anemia, unspecified: Secondary | ICD-10-CM | POA: Diagnosis not present

## 2021-07-01 DIAGNOSIS — J158 Pneumonia due to other specified bacteria: Secondary | ICD-10-CM | POA: Diagnosis not present

## 2021-07-01 LAB — CBC
HCT: 21.2 % — ABNORMAL LOW (ref 39.0–52.0)
Hemoglobin: 7.1 g/dL — ABNORMAL LOW (ref 13.0–17.0)
MCH: 33.5 pg (ref 26.0–34.0)
MCHC: 33.5 g/dL (ref 30.0–36.0)
MCV: 100 fL (ref 80.0–100.0)
Platelets: 283 10*3/uL (ref 150–400)
RBC: 2.12 MIL/uL — ABNORMAL LOW (ref 4.22–5.81)
RDW: 13.6 % (ref 11.5–15.5)
WBC: 6.9 10*3/uL (ref 4.0–10.5)
nRBC: 0 % (ref 0.0–0.2)

## 2021-07-01 LAB — TYPE AND SCREEN
ABO/RH(D): O POS
Antibody Screen: NEGATIVE

## 2021-07-01 LAB — COMPREHENSIVE METABOLIC PANEL
ALT: 7 U/L (ref 0–44)
AST: 12 U/L — ABNORMAL LOW (ref 15–41)
Albumin: 3.2 g/dL — ABNORMAL LOW (ref 3.5–5.0)
Alkaline Phosphatase: 45 U/L (ref 38–126)
Anion gap: 11 (ref 5–15)
BUN: 11 mg/dL (ref 6–20)
CO2: 33 mmol/L — ABNORMAL HIGH (ref 22–32)
Calcium: 9.6 mg/dL (ref 8.9–10.3)
Chloride: 94 mmol/L — ABNORMAL LOW (ref 98–111)
Creatinine, Ser: 6.09 mg/dL — ABNORMAL HIGH (ref 0.61–1.24)
GFR, Estimated: 10 mL/min — ABNORMAL LOW (ref >60)
Glucose, Bld: 101 mg/dL — ABNORMAL HIGH (ref 70–99)
Potassium: 3.3 mmol/L — ABNORMAL LOW (ref 3.5–5.1)
Sodium: 138 mmol/L (ref 135–145)
Total Bilirubin: 0.5 mg/dL (ref 0.3–1.2)
Total Protein: 7 g/dL (ref 6.5–8.1)

## 2021-07-01 NOTE — ED Triage Notes (Signed)
MWF dialysis patient, reports his dialysis center told him last week that his hemoglobin is 6.6 today after it was 7.9 last week. Completed 3.5/4 hours of dialysis today. Pt endorses general fatigue. Denies bleeding. Supposed to start radiation for prostate cancer next week.

## 2021-07-01 NOTE — ED Provider Triage Note (Signed)
Emergency Medicine Provider Triage Evaluation Note  Darrek Leasure , a 56 y.o. male  was evaluated in triage.  Pt complains of low hemoglobin of 6.6 "sometime last week". Wnt to dialysis and only stayed 3.5 hours because "he didn't have much on". MWF dialysis. No black stools or belly pain. Active prostate cancer. Feeling fatigued.   Review of Systems  Positive:  Negative:   Physical Exam  BP (!) 176/100 (BP Location: Right Arm)   Pulse 72   Temp 99.1 F (37.3 C)   Resp 18   SpO2 96%  Gen:   Awake, no distress   Resp:  Normal effort  MSK:   Moves extremities without difficulty  Other:    Medical Decision Making  Medically screening exam initiated at 12:48 PM.  Appropriate orders placed.  [name] was informed that the remainder of the evaluation will be completed by another provider, this initial triage assessment does not replace that evaluation, and the importance of remaining in the ED until their evaluation is complete.  Basic labs and type and screen ordered. Vitals show HTN otherwise stable.    Sherrell Puller, PA-C 07/01/21 1252

## 2021-07-01 NOTE — ED Provider Notes (Signed)
[facility] EMERGENCY DEPARTMENT Provider Note   CSN: [id number] Arrival date & time: 07/01/21  1227     History  Chief Complaint  Patient presents with   Abnormal Lab    James Wall is a 56 y.o. male with a history of ESRD on dialysis MWF, COPD, HTN, HLD, prostate cancer presenting to the ED with concerns for anemia. Patient states that last week at dialysis he had labs drawn which showed a hemoglobin of 6.6.  He was instructed to present to the ED for a blood transfusion but was unable to do so due to lack of transportation.  He presents today for a blood transfusion.  He states he has not had repeat labs since labs taken last week.  He did have dialysis this morning which he tolerated well.  He states that he has had some fatigue with exertion over the past several weeks, but otherwise denies any associated symptoms.  No shortness of breath, chest pain, lightheadedness.  He denies hematuria, hematochezia, melena, or other sources of bleeding.   Abnormal Lab     Home Medications Prior to Admission medications   Medication Sig Start Date End Date Taking? Authorizing Provider  acetaminophen (TYLENOL) 500 MG tablet Take 500 mg by mouth every 6 (six) hours as needed.    [provider]  amLODipine (NORVASC) 10 MG tablet TAKE 1 TABLET(10 MG) BY MOUTH DAILY Patient taking differently: Take 10 mg by mouth at bedtime. 04/26/18   Allred, Jeneen Rinks, MD  chlorproMAZINE (THORAZINE) 10 MG tablet Take 1 tablet (10 mg total) by mouth 3 (three) times daily as needed for hiccups. 08/10/20   Allie Bossier, MD  cloNIDine (CATAPRES - DOSED IN MG/24 HR) 0.3 mg/24hr patch Place 0.3 mg onto the skin every Sunday. 12/29/18   [provider]  heparin 1000 unit/mL SOLN injection Heparin Sodium (Porcine) 1,000 Units/mL Systemic 11/21/20 11/20/21  [provider]  hydrALAZINE (APRESOLINE) 25 MG tablet Take 1 tablet (25 mg total) by mouth 2 (two) times  daily. Patient taking differently: Take 25 mg by mouth 2 (two) times daily. 09/13/20   Werner Lean, MD  hydrOXYzine (ATARAX/VISTARIL) 25 MG tablet Take 1 tablet (25 mg total) by mouth every 8 (eight) hours as needed for itching. Further refills need to come from your PCP. 04/23/17   Dorothy Spark, MD  Ipratropium-Albuterol (COMBIVENT) 20-100 MCG/ACT AERS respimat Inhale 1 puff into the lungs every 6 (six) hours as needed for wheezing. 08/10/20   Allie Bossier, MD  isosorbide dinitrate (ISORDIL) 20 MG tablet TAKE 1 TABLET(20 MG) BY MOUTH TWICE DAILY Patient taking differently: Take 20 mg by mouth at bedtime. TAKE 1 TABLET(20 MG) BY MOUTH TWICE DAILY 11/01/20   Werner Lean, MD  Methoxy PEG-Epoetin Beta (MIRCERA IJ) Dialysis Monday,Wednesday,friday 08/24/19 07/31/21  [provider]  metoprolol tartrate (LOPRESSOR) 100 MG tablet Take 1 tablet by mouth twice a day.  ON DIALYSIS DAYS, TAKE AFTER DIALYSIS. Patient taking differently: as directed. Take 1 tablet by mouth twice a day.  ON DIALYSIS DAYS, TAKE AFTER DIALYSIS. 11/01/20   Rudean Haskell A, MD  omeprazole (PRILOSEC) 40 MG capsule Take 40 mg by mouth daily as needed (heartburn). 02/27/21   [provider]  ondansetron (ZOFRAN) 8 MG tablet Take 8 mg by mouth every 8 (eight) hours as needed for nausea/vomiting. 05/14/21   [provider]  oxybutynin (DITROPAN XL) 10 MG 24 hr tablet Take 1 tablet (10 mg total) by mouth  at bedtime. Patient taking differently: Take 10 mg by mouth daily as needed (frequency). 03/21/21   Bruning, Ashlyn, PA-C  pantoprazole (PROTONIX) 40 MG tablet Take 1 tablet (40 mg total) by mouth 2 (two) times daily before a meal. Patient taking differently: Take 40 mg by mouth 2 (two) times daily as needed (acid reflux). 03/10/19   Thornton Park, MD  sevelamer carbonate (RENVELA) 800 MG tablet Take 1,600-3,200 mg by mouth See admin instructions. 2-4 tabs with meals depending on  meal size    [provider]  tamsulosin (FLOMAX) 0.4 MG CAPS capsule TAKE 2 CAPSULES(0.8 MG) BY MOUTH DAILY Patient taking differently: 0.8 mg at bedtime. 09/06/20   [name], MD  umeclidinium-vilanterol (ANORO ELLIPTA) 62.5-25 MCG/INH AEPB Inhale 1 puff into the lungs daily. Patient taking differently: Inhale 1 puff into the lungs daily as needed (shortneess of breath). 09/20/19   Autry-Lott, Naaman Plummer, DO      Allergies    Venofer  [ferric oxide] and Aspirin    Review of Systems   Review of Systems  Constitutional:  Positive for fatigue (with exertion). Negative for fever.  Respiratory:  Negative for shortness of breath.   Cardiovascular:  Negative for chest pain.  Gastrointestinal:  Negative for abdominal pain and vomiting.  Neurological:  Negative for syncope and light-headedness.   Physical Exam Updated Vital Signs BP (!) 154/83 (BP Location: Right Arm)   Pulse 71   Temp 98.4 F (36.9 C) (Oral)   Resp 15   SpO2 99%  Physical Exam Constitutional:      General: He is not in acute distress.    Appearance: He is not toxic-appearing or diaphoretic.  HENT:     Head: Normocephalic and atraumatic.     Nose: Nose normal.  Eyes:     General: No scleral icterus. Cardiovascular:     Rate and Rhythm: Normal rate and regular rhythm.     Pulses: Normal pulses.     Heart sounds: Normal heart sounds. No murmur heard.   No friction rub. No gallop.  Pulmonary:     Effort: Pulmonary effort is normal. No respiratory distress.     Breath sounds: Normal breath sounds. No stridor. No wheezing, rhonchi or rales.  Abdominal:     Palpations: Abdomen is soft.     Tenderness: There is no abdominal tenderness. There is no guarding or rebound.  Musculoskeletal:        General: No deformity.     Cervical back: Neck supple.  Skin:    General: Skin is warm and dry.  Neurological:     General: No focal deficit present.     Mental Status: He is alert and oriented to person, place,  and time.    ED Results / Procedures / Treatments   Labs (all labs ordered are listed, but only abnormal results are displayed) Labs Reviewed  COMPREHENSIVE METABOLIC PANEL - Abnormal; Notable for the following components:      Result Value   Potassium 3.3 (*)    Chloride 94 (*)    CO2 33 (*)    Glucose, Bld 101 (*)    Creatinine, Ser 6.09 (*)    Albumin 3.2 (*)    AST 12 (*)    GFR, Estimated 10 (*)    All other components within normal limits  CBC - Abnormal; Notable for the following components:   RBC 2.12 (*)    Hemoglobin 7.1 (*)    HCT 21.2 (*)    All other components  within normal limits  POC OCCULT BLOOD, ED  TYPE AND SCREEN    EKG None  Radiology No results found.  Procedures Procedures    Medications Ordered in ED Medications - No data to display  ED Course/ Medical Decision Making/ A&P                           Medical Decision Making Amount and/or Complexity of Data Reviewed Labs: ordered.   James Wall is a 56 y.o. male with a history of ESRD on dialysis MWF, COPD, HTN, HLD, prostate cancer presenting to the ED with concerns for anemia.  On exam, patient is afebrile and hemodynamically stable.  Cardiac and pulmonary exam are within normal limits.  Patient states he overall feels well but does endorse some fatigue with exertion over the past several weeks.  Repeat labs were obtained today.  CBC showed a hemoglobin of 7.1 (reportedly up from 6.6 last week).  CMP notable for mild hypokalemia with potassium 3.4, bicarb of 33, creatinine of 6.  Patient did have his full dialysis session this morning.  On chart review, it does appear the patient's hemoglobin has been downtrending over the past several months.  It appears his baseline is around 8, and on review today is 7.1.  I think this is likely due to his end-stage kidney disease.  Given he has had no signs of bleeding, low suspicion for acute blood loss anemia.  Patient states that he does get  iron supplementation weekly at dialysis.  Given that his hemoglobin has self corrected to 7.1 from 6.6 last week, I do not think he requires a transfusion at this time.  He also is not severely symptomatic, only endorsing some fatigue with exertion, but he has not had any chest pain, shortness of breath, or lightheadedness so emergent transfusion not required at this time.  Patient is amenable to this plan.  He will continue to have regular blood work done at dialysis as well as with his prostate radiation that starts next week.  Strict return precautions were discussed and the patient was discharged home in stable condition.        Final Clinical Impression(s) / ED Diagnoses Final diagnoses:  Anemia due to chronic kidney disease, on chronic dialysis [facility])    Rx / DC Orders ED Discharge Orders     None         Sondra Come, MD 07/01/21 0867    Gareth Morgan, MD 07/02/21 1240

## 2021-07-01 NOTE — Discharge Instructions (Signed)
Please return to the Emergency Department if you develop severe shortness of breath, lightheadedness, or chest pain.

## 2021-07-03 ENCOUNTER — Other Ambulatory Visit: Payer: Self-pay

## 2021-07-03 ENCOUNTER — Ambulatory Visit
Admission: RE | Admit: 2021-07-03 | Discharge: 2021-07-03 | Disposition: A | Discharge status: Home / Self Care | Payer: Medicare Other | Source: Ambulatory Visit | Attending: Radiation Oncology | Admitting: Radiation Oncology

## 2021-07-03 DIAGNOSIS — D509 Iron deficiency anemia, unspecified: Secondary | ICD-10-CM | POA: Diagnosis not present

## 2021-07-03 DIAGNOSIS — Z191 Hormone sensitive malignancy status: Secondary | ICD-10-CM | POA: Diagnosis not present

## 2021-07-03 DIAGNOSIS — N186 End stage renal disease: Secondary | ICD-10-CM | POA: Diagnosis not present

## 2021-07-03 DIAGNOSIS — Z510 Encounter for antineoplastic radiation therapy: Secondary | ICD-10-CM | POA: Diagnosis not present

## 2021-07-03 DIAGNOSIS — D631 Anemia in chronic kidney disease: Secondary | ICD-10-CM | POA: Diagnosis not present

## 2021-07-03 DIAGNOSIS — N2581 Secondary hyperparathyroidism of renal origin: Secondary | ICD-10-CM | POA: Diagnosis not present

## 2021-07-03 DIAGNOSIS — Z992 Dependence on renal dialysis: Secondary | ICD-10-CM | POA: Diagnosis not present

## 2021-07-03 DIAGNOSIS — C61 Malignant neoplasm of prostate: Secondary | ICD-10-CM | POA: Diagnosis not present

## 2021-07-03 DIAGNOSIS — J158 Pneumonia due to other specified bacteria: Secondary | ICD-10-CM | POA: Diagnosis not present

## 2021-07-03 LAB — RAD ONC ARIA SESSION SUMMARY
Course Elapsed Days: 0
Plan Fractions Treated to Date: 1
Plan Prescribed Dose Per Fraction: 1.8 Gy
Plan Total Fractions Prescribed: 25
Plan Total Prescribed Dose: 45 Gy
Reference Point Dosage Given to Date: 1.8 Gy
Reference Point Session Dosage Given: 1.8 Gy
Session Number: 1

## 2021-07-04 ENCOUNTER — Other Ambulatory Visit: Payer: Self-pay

## 2021-07-04 ENCOUNTER — Ambulatory Visit
Admission: RE | Admit: 2021-07-04 | Discharge: 2021-07-04 | Disposition: A | Discharge status: Home / Self Care | Payer: Medicare Other | Source: Ambulatory Visit | Attending: Radiation Oncology | Admitting: Radiation Oncology

## 2021-07-04 DIAGNOSIS — Z510 Encounter for antineoplastic radiation therapy: Secondary | ICD-10-CM | POA: Diagnosis not present

## 2021-07-04 DIAGNOSIS — Z191 Hormone sensitive malignancy status: Secondary | ICD-10-CM | POA: Diagnosis not present

## 2021-07-04 DIAGNOSIS — D509 Iron deficiency anemia, unspecified: Secondary | ICD-10-CM | POA: Diagnosis not present

## 2021-07-04 DIAGNOSIS — Z992 Dependence on renal dialysis: Secondary | ICD-10-CM | POA: Diagnosis not present

## 2021-07-04 DIAGNOSIS — C61 Malignant neoplasm of prostate: Secondary | ICD-10-CM | POA: Insufficient documentation

## 2021-07-04 DIAGNOSIS — J158 Pneumonia due to other specified bacteria: Secondary | ICD-10-CM | POA: Diagnosis not present

## 2021-07-04 DIAGNOSIS — I12 Hypertensive chronic kidney disease with 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: Secondary | ICD-10-CM | POA: Diagnosis not present

## 2021-07-04 DIAGNOSIS — N2581 Secondary hyperparathyroidism of renal origin: Secondary | ICD-10-CM | POA: Diagnosis not present

## 2021-07-04 DIAGNOSIS — D631 Anemia in chronic kidney disease: Secondary | ICD-10-CM | POA: Diagnosis not present

## 2021-07-04 DIAGNOSIS — N186 End stage renal disease: Secondary | ICD-10-CM | POA: Diagnosis not present

## 2021-07-04 LAB — RAD ONC ARIA SESSION SUMMARY
Course Elapsed Days: 1
Plan Fractions Treated to Date: 2
Plan Prescribed Dose Per Fraction: 1.8 Gy
Plan Total Fractions Prescribed: 25
Plan Total Prescribed Dose: 45 Gy
Reference Point Dosage Given to Date: 3.6 Gy
Reference Point Session Dosage Given: 1.8 Gy
Session Number: 2

## 2021-07-05 ENCOUNTER — Ambulatory Visit
Admission: RE | Admit: 2021-07-05 | Discharge: 2021-07-05 | Disposition: A | Discharge status: Home / Self Care | Payer: Medicare Other | Source: Ambulatory Visit | Attending: Radiation Oncology | Admitting: Radiation Oncology

## 2021-07-05 ENCOUNTER — Other Ambulatory Visit: Payer: Self-pay

## 2021-07-05 DIAGNOSIS — C61 Malignant neoplasm of prostate: Secondary | ICD-10-CM | POA: Diagnosis not present

## 2021-07-05 DIAGNOSIS — N186 End stage renal disease: Secondary | ICD-10-CM | POA: Diagnosis not present

## 2021-07-05 DIAGNOSIS — J158 Pneumonia due to other specified bacteria: Secondary | ICD-10-CM | POA: Diagnosis not present

## 2021-07-05 DIAGNOSIS — D509 Iron deficiency anemia, unspecified: Secondary | ICD-10-CM | POA: Diagnosis not present

## 2021-07-05 DIAGNOSIS — N2581 Secondary hyperparathyroidism of renal origin: Secondary | ICD-10-CM | POA: Diagnosis not present

## 2021-07-05 DIAGNOSIS — Z510 Encounter for antineoplastic radiation therapy: Secondary | ICD-10-CM | POA: Diagnosis not present

## 2021-07-05 DIAGNOSIS — D631 Anemia in chronic kidney disease: Secondary | ICD-10-CM | POA: Diagnosis not present

## 2021-07-05 DIAGNOSIS — Z992 Dependence on renal dialysis: Secondary | ICD-10-CM | POA: Diagnosis not present

## 2021-07-05 DIAGNOSIS — Z191 Hormone sensitive malignancy status: Secondary | ICD-10-CM | POA: Diagnosis not present

## 2021-07-05 LAB — RAD ONC ARIA SESSION SUMMARY
Course Elapsed Days: 2
Plan Fractions Treated to Date: 3
Plan Prescribed Dose Per Fraction: 1.8 Gy
Plan Total Fractions Prescribed: 25
Plan Total Prescribed Dose: 45 Gy
Reference Point Dosage Given to Date: 5.4 Gy
Reference Point Session Dosage Given: 1.8 Gy
Session Number: 3

## 2021-07-05 NOTE — Progress Notes (Signed)
Pt here for patient teaching. Pt given Radiation and You booklet and skin care instructions.  Reviewed areas of pertinence such as diarrhea, fatigue, hair loss, nausea and vomiting, sexual and fertility changes, skin changes, urinary and bladder changes, and headache. Pt able to give teach back of to pat skin, use unscented/gentle soap, use baby wipes, have Imodium on hand, drink plenty of water, and sitz bath, avoid applying anything to skin within 4 hours of treatment and to use an electric razor if they must shave. Pt verbalizes understanding of information given and will contact nursing with any questions or concerns.     [email or web address]

## 2021-07-08 ENCOUNTER — Other Ambulatory Visit: Payer: Self-pay

## 2021-07-08 ENCOUNTER — Ambulatory Visit
Admission: RE | Admit: 2021-07-08 | Discharge: 2021-07-08 | Disposition: A | Discharge status: Home / Self Care | Payer: Medicare Other | Source: Ambulatory Visit | Attending: Radiation Oncology | Admitting: Radiation Oncology

## 2021-07-08 DIAGNOSIS — Z191 Hormone sensitive malignancy status: Secondary | ICD-10-CM | POA: Diagnosis not present

## 2021-07-08 DIAGNOSIS — C61 Malignant neoplasm of prostate: Secondary | ICD-10-CM | POA: Diagnosis not present

## 2021-07-08 DIAGNOSIS — J158 Pneumonia due to other specified bacteria: Secondary | ICD-10-CM | POA: Diagnosis not present

## 2021-07-08 DIAGNOSIS — D631 Anemia in chronic kidney disease: Secondary | ICD-10-CM | POA: Diagnosis not present

## 2021-07-08 DIAGNOSIS — Z992 Dependence on renal dialysis: Secondary | ICD-10-CM | POA: Diagnosis not present

## 2021-07-08 DIAGNOSIS — N186 End stage renal disease: Secondary | ICD-10-CM | POA: Diagnosis not present

## 2021-07-08 DIAGNOSIS — D509 Iron deficiency anemia, unspecified: Secondary | ICD-10-CM | POA: Diagnosis not present

## 2021-07-08 DIAGNOSIS — Z510 Encounter for antineoplastic radiation therapy: Secondary | ICD-10-CM | POA: Diagnosis not present

## 2021-07-08 DIAGNOSIS — N2581 Secondary hyperparathyroidism of renal origin: Secondary | ICD-10-CM | POA: Diagnosis not present

## 2021-07-08 LAB — RAD ONC ARIA SESSION SUMMARY
Course Elapsed Days: 5
Plan Fractions Treated to Date: 4
Plan Prescribed Dose Per Fraction: 1.8 Gy
Plan Total Fractions Prescribed: 25
Plan Total Prescribed Dose: 45 Gy
Reference Point Dosage Given to Date: 7.2 Gy
Reference Point Session Dosage Given: 1.8 Gy
Session Number: 4

## 2021-07-09 ENCOUNTER — Other Ambulatory Visit: Payer: Self-pay

## 2021-07-09 ENCOUNTER — Encounter: Payer: Self-pay | Admitting: *Deleted

## 2021-07-09 ENCOUNTER — Ambulatory Visit
Admission: RE | Admit: 2021-07-09 | Discharge: 2021-07-09 | Disposition: A | Discharge status: Home / Self Care | Payer: Medicare Other | Source: Ambulatory Visit | Attending: Radiation Oncology | Admitting: Radiation Oncology

## 2021-07-09 DIAGNOSIS — C61 Malignant neoplasm of prostate: Secondary | ICD-10-CM | POA: Diagnosis not present

## 2021-07-09 DIAGNOSIS — Z191 Hormone sensitive malignancy status: Secondary | ICD-10-CM | POA: Diagnosis not present

## 2021-07-09 DIAGNOSIS — Z510 Encounter for antineoplastic radiation therapy: Secondary | ICD-10-CM | POA: Diagnosis not present

## 2021-07-09 LAB — RAD ONC ARIA SESSION SUMMARY
Course Elapsed Days: 6
Plan Fractions Treated to Date: 5
Plan Prescribed Dose Per Fraction: 1.8 Gy
Plan Total Fractions Prescribed: 25
Plan Total Prescribed Dose: 45 Gy
Reference Point Dosage Given to Date: 9 Gy
Reference Point Session Dosage Given: 1.8 Gy
Session Number: 5

## 2021-07-10 ENCOUNTER — Other Ambulatory Visit: Payer: Self-pay

## 2021-07-10 ENCOUNTER — Ambulatory Visit
Admission: RE | Admit: 2021-07-10 | Discharge: 2021-07-10 | Disposition: A | Discharge status: Home / Self Care | Payer: Medicare Other | Source: Ambulatory Visit | Attending: Radiation Oncology | Admitting: Radiation Oncology

## 2021-07-10 DIAGNOSIS — D631 Anemia in chronic kidney disease: Secondary | ICD-10-CM | POA: Diagnosis not present

## 2021-07-10 DIAGNOSIS — Z191 Hormone sensitive malignancy status: Secondary | ICD-10-CM | POA: Diagnosis not present

## 2021-07-10 DIAGNOSIS — N2581 Secondary hyperparathyroidism of renal origin: Secondary | ICD-10-CM | POA: Diagnosis not present

## 2021-07-10 DIAGNOSIS — J158 Pneumonia due to other specified bacteria: Secondary | ICD-10-CM | POA: Diagnosis not present

## 2021-07-10 DIAGNOSIS — Z992 Dependence on renal dialysis: Secondary | ICD-10-CM | POA: Diagnosis not present

## 2021-07-10 DIAGNOSIS — Z510 Encounter for antineoplastic radiation therapy: Secondary | ICD-10-CM | POA: Diagnosis not present

## 2021-07-10 DIAGNOSIS — N186 End stage renal disease: Secondary | ICD-10-CM | POA: Diagnosis not present

## 2021-07-10 DIAGNOSIS — C61 Malignant neoplasm of prostate: Secondary | ICD-10-CM | POA: Diagnosis not present

## 2021-07-10 DIAGNOSIS — D509 Iron deficiency anemia, unspecified: Secondary | ICD-10-CM | POA: Diagnosis not present

## 2021-07-10 LAB — RAD ONC ARIA SESSION SUMMARY
Course Elapsed Days: 7
Plan Fractions Treated to Date: 6
Plan Prescribed Dose Per Fraction: 1.8 Gy
Plan Total Fractions Prescribed: 25
Plan Total Prescribed Dose: 45 Gy
Reference Point Dosage Given to Date: 10.8 Gy
Reference Point Session Dosage Given: 1.8 Gy
Session Number: 6

## 2021-07-11 ENCOUNTER — Other Ambulatory Visit: Payer: Self-pay

## 2021-07-11 ENCOUNTER — Ambulatory Visit
Admission: RE | Admit: 2021-07-11 | Discharge: 2021-07-11 | Disposition: A | Discharge status: Home / Self Care | Payer: Medicare Other | Source: Ambulatory Visit | Attending: Radiation Oncology | Admitting: Radiation Oncology

## 2021-07-11 DIAGNOSIS — Z191 Hormone sensitive malignancy status: Secondary | ICD-10-CM | POA: Diagnosis not present

## 2021-07-11 DIAGNOSIS — Z510 Encounter for antineoplastic radiation therapy: Secondary | ICD-10-CM | POA: Diagnosis not present

## 2021-07-11 DIAGNOSIS — C61 Malignant neoplasm of prostate: Secondary | ICD-10-CM | POA: Diagnosis not present

## 2021-07-11 LAB — RAD ONC ARIA SESSION SUMMARY
Course Elapsed Days: 8
Plan Fractions Treated to Date: 7
Plan Prescribed Dose Per Fraction: 1.8 Gy
Plan Total Fractions Prescribed: 25
Plan Total Prescribed Dose: 45 Gy
Reference Point Dosage Given to Date: 12.6 Gy
Reference Point Session Dosage Given: 1.8 Gy
Session Number: 7

## 2021-07-12 ENCOUNTER — Ambulatory Visit: Payer: Medicare Other

## 2021-07-12 DIAGNOSIS — J158 Pneumonia due to other specified bacteria: Secondary | ICD-10-CM | POA: Diagnosis not present

## 2021-07-12 DIAGNOSIS — Z992 Dependence on renal dialysis: Secondary | ICD-10-CM | POA: Diagnosis not present

## 2021-07-12 DIAGNOSIS — D631 Anemia in chronic kidney disease: Secondary | ICD-10-CM | POA: Diagnosis not present

## 2021-07-12 DIAGNOSIS — N2581 Secondary hyperparathyroidism of renal origin: Secondary | ICD-10-CM | POA: Diagnosis not present

## 2021-07-12 DIAGNOSIS — D509 Iron deficiency anemia, unspecified: Secondary | ICD-10-CM | POA: Diagnosis not present

## 2021-07-12 DIAGNOSIS — N186 End stage renal disease: Secondary | ICD-10-CM | POA: Diagnosis not present

## 2021-07-15 ENCOUNTER — Other Ambulatory Visit: Payer: Self-pay

## 2021-07-15 ENCOUNTER — Ambulatory Visit
Admission: RE | Admit: 2021-07-15 | Discharge: 2021-07-15 | Disposition: A | Discharge status: Home / Self Care | Payer: Medicare Other | Source: Ambulatory Visit | Attending: Radiation Oncology | Admitting: Radiation Oncology

## 2021-07-15 DIAGNOSIS — N186 End stage renal disease: Secondary | ICD-10-CM | POA: Diagnosis not present

## 2021-07-15 DIAGNOSIS — N2581 Secondary hyperparathyroidism of renal origin: Secondary | ICD-10-CM | POA: Diagnosis not present

## 2021-07-15 DIAGNOSIS — Z510 Encounter for antineoplastic radiation therapy: Secondary | ICD-10-CM | POA: Diagnosis not present

## 2021-07-15 DIAGNOSIS — C61 Malignant neoplasm of prostate: Secondary | ICD-10-CM | POA: Diagnosis not present

## 2021-07-15 DIAGNOSIS — Z992 Dependence on renal dialysis: Secondary | ICD-10-CM | POA: Diagnosis not present

## 2021-07-15 DIAGNOSIS — J158 Pneumonia due to other specified bacteria: Secondary | ICD-10-CM | POA: Diagnosis not present

## 2021-07-15 DIAGNOSIS — Z191 Hormone sensitive malignancy status: Secondary | ICD-10-CM | POA: Diagnosis not present

## 2021-07-15 DIAGNOSIS — D509 Iron deficiency anemia, unspecified: Secondary | ICD-10-CM | POA: Diagnosis not present

## 2021-07-15 DIAGNOSIS — D631 Anemia in chronic kidney disease: Secondary | ICD-10-CM | POA: Diagnosis not present

## 2021-07-15 LAB — RAD ONC ARIA SESSION SUMMARY
Course Elapsed Days: 12
Plan Fractions Treated to Date: 8
Plan Prescribed Dose Per Fraction: 1.8 Gy
Plan Total Fractions Prescribed: 25
Plan Total Prescribed Dose: 45 Gy
Reference Point Dosage Given to Date: 14.4 Gy
Reference Point Session Dosage Given: 1.8 Gy
Session Number: 8

## 2021-07-16 ENCOUNTER — Other Ambulatory Visit: Payer: Self-pay

## 2021-07-16 ENCOUNTER — Inpatient Hospital Stay: Payer: Medicare Other | Attending: Radiation Oncology

## 2021-07-16 ENCOUNTER — Ambulatory Visit
Admission: RE | Admit: 2021-07-16 | Discharge: 2021-07-16 | Disposition: A | Discharge status: Home / Self Care | Payer: Medicare Other | Source: Ambulatory Visit | Attending: Radiation Oncology | Admitting: Radiation Oncology

## 2021-07-16 DIAGNOSIS — Z191 Hormone sensitive malignancy status: Secondary | ICD-10-CM | POA: Diagnosis not present

## 2021-07-16 DIAGNOSIS — Z510 Encounter for antineoplastic radiation therapy: Secondary | ICD-10-CM | POA: Diagnosis not present

## 2021-07-16 DIAGNOSIS — C61 Malignant neoplasm of prostate: Secondary | ICD-10-CM | POA: Diagnosis not present

## 2021-07-16 LAB — RAD ONC ARIA SESSION SUMMARY
Course Elapsed Days: 13
Plan Fractions Treated to Date: 9
Plan Prescribed Dose Per Fraction: 1.8 Gy
Plan Total Fractions Prescribed: 25
Plan Total Prescribed Dose: 45 Gy
Reference Point Dosage Given to Date: 16.2 Gy
Reference Point Session Dosage Given: 1.8 Gy
Session Number: 9

## 2021-07-17 ENCOUNTER — Ambulatory Visit
Admission: RE | Admit: 2021-07-17 | Discharge: 2021-07-17 | Disposition: A | Discharge status: Home / Self Care | Payer: Medicare Other | Source: Ambulatory Visit | Attending: Radiation Oncology | Admitting: Radiation Oncology

## 2021-07-17 ENCOUNTER — Other Ambulatory Visit: Payer: Self-pay

## 2021-07-17 ENCOUNTER — Inpatient Hospital Stay: Payer: Medicare Other

## 2021-07-17 ENCOUNTER — Other Ambulatory Visit (HOSPITAL_COMMUNITY): Payer: Self-pay | Admitting: *Deleted

## 2021-07-17 DIAGNOSIS — Z992 Dependence on renal dialysis: Secondary | ICD-10-CM | POA: Diagnosis not present

## 2021-07-17 DIAGNOSIS — N2581 Secondary hyperparathyroidism of renal origin: Secondary | ICD-10-CM | POA: Diagnosis not present

## 2021-07-17 DIAGNOSIS — D509 Iron deficiency anemia, unspecified: Secondary | ICD-10-CM | POA: Diagnosis not present

## 2021-07-17 DIAGNOSIS — J158 Pneumonia due to other specified bacteria: Secondary | ICD-10-CM | POA: Diagnosis not present

## 2021-07-17 DIAGNOSIS — D631 Anemia in chronic kidney disease: Secondary | ICD-10-CM | POA: Diagnosis not present

## 2021-07-17 DIAGNOSIS — Z510 Encounter for antineoplastic radiation therapy: Secondary | ICD-10-CM | POA: Diagnosis not present

## 2021-07-17 DIAGNOSIS — C61 Malignant neoplasm of prostate: Secondary | ICD-10-CM | POA: Diagnosis not present

## 2021-07-17 DIAGNOSIS — Z191 Hormone sensitive malignancy status: Secondary | ICD-10-CM | POA: Diagnosis not present

## 2021-07-17 DIAGNOSIS — N186 End stage renal disease: Secondary | ICD-10-CM | POA: Diagnosis not present

## 2021-07-17 LAB — RAD ONC ARIA SESSION SUMMARY
Course Elapsed Days: 14
Plan Fractions Treated to Date: 10
Plan Prescribed Dose Per Fraction: 1.8 Gy
Plan Total Fractions Prescribed: 25
Plan Total Prescribed Dose: 45 Gy
Reference Point Dosage Given to Date: 18 Gy
Reference Point Session Dosage Given: 1.8 Gy
Session Number: 10

## 2021-07-18 ENCOUNTER — Ambulatory Visit: Payer: Medicare Other

## 2021-07-18 ENCOUNTER — Telehealth: Payer: Self-pay | Admitting: Radiation Oncology

## 2021-07-18 ENCOUNTER — Ambulatory Visit (HOSPITAL_COMMUNITY)
Admission: RE | Admit: 2021-07-18 | Discharge: 2021-07-18 | Disposition: A | Discharge status: Home / Self Care | Payer: Medicare Other | Source: Ambulatory Visit | Attending: Nephrology | Admitting: Nephrology

## 2021-07-18 DIAGNOSIS — D631 Anemia in chronic kidney disease: Secondary | ICD-10-CM | POA: Insufficient documentation

## 2021-07-18 DIAGNOSIS — N189 Chronic kidney disease, unspecified: Secondary | ICD-10-CM | POA: Diagnosis not present

## 2021-07-18 LAB — PREPARE RBC (CROSSMATCH)

## 2021-07-18 MED ORDER — SODIUM CHLORIDE 0.9% IV SOLUTION
Freq: Once | INTRAVENOUS | Status: DC
Start: 2021-07-18 — End: 2021-07-19

## 2021-07-18 NOTE — Telephone Encounter (Signed)
Patient called stating he needed to cancel his [place] for 6/15 due to transportation issues. L3 notified.

## 2021-07-19 ENCOUNTER — Other Ambulatory Visit: Payer: Self-pay

## 2021-07-19 ENCOUNTER — Ambulatory Visit
Admission: RE | Admit: 2021-07-19 | Discharge: 2021-07-19 | Disposition: A | Discharge status: Home / Self Care | Payer: Medicare Other | Source: Ambulatory Visit | Attending: Radiation Oncology | Admitting: Radiation Oncology

## 2021-07-19 DIAGNOSIS — Z191 Hormone sensitive malignancy status: Secondary | ICD-10-CM | POA: Diagnosis not present

## 2021-07-19 DIAGNOSIS — N2581 Secondary hyperparathyroidism of renal origin: Secondary | ICD-10-CM | POA: Diagnosis not present

## 2021-07-19 DIAGNOSIS — Z992 Dependence on renal dialysis: Secondary | ICD-10-CM | POA: Diagnosis not present

## 2021-07-19 DIAGNOSIS — J158 Pneumonia due to other specified bacteria: Secondary | ICD-10-CM | POA: Diagnosis not present

## 2021-07-19 DIAGNOSIS — D509 Iron deficiency anemia, unspecified: Secondary | ICD-10-CM | POA: Diagnosis not present

## 2021-07-19 DIAGNOSIS — C61 Malignant neoplasm of prostate: Secondary | ICD-10-CM | POA: Diagnosis not present

## 2021-07-19 DIAGNOSIS — Z510 Encounter for antineoplastic radiation therapy: Secondary | ICD-10-CM | POA: Diagnosis not present

## 2021-07-19 DIAGNOSIS — N186 End stage renal disease: Secondary | ICD-10-CM | POA: Diagnosis not present

## 2021-07-19 DIAGNOSIS — D631 Anemia in chronic kidney disease: Secondary | ICD-10-CM | POA: Diagnosis not present

## 2021-07-19 LAB — TYPE AND SCREEN
ABO/RH(D): O POS
Antibody Screen: NEGATIVE
Unit division: 0

## 2021-07-19 LAB — RAD ONC ARIA SESSION SUMMARY
Course Elapsed Days: 16
Plan Fractions Treated to Date: 11
Plan Prescribed Dose Per Fraction: 1.8 Gy
Plan Total Fractions Prescribed: 25
Plan Total Prescribed Dose: 45 Gy
Reference Point Dosage Given to Date: 19.8 Gy
Reference Point Session Dosage Given: 1.8 Gy
Session Number: 11

## 2021-07-19 LAB — BPAM RBC
Blood Product Expiration Date: 202306222359
ISSUE DATE / TIME: 202306150956
Unit Type and Rh: 9500

## 2021-07-22 ENCOUNTER — Other Ambulatory Visit: Payer: Self-pay

## 2021-07-22 ENCOUNTER — Ambulatory Visit
Admission: RE | Admit: 2021-07-22 | Discharge: 2021-07-22 | Disposition: A | Discharge status: Home / Self Care | Payer: Medicare Other | Source: Ambulatory Visit | Attending: Radiation Oncology | Admitting: Radiation Oncology

## 2021-07-22 DIAGNOSIS — J158 Pneumonia due to other specified bacteria: Secondary | ICD-10-CM | POA: Diagnosis not present

## 2021-07-22 DIAGNOSIS — C61 Malignant neoplasm of prostate: Secondary | ICD-10-CM | POA: Diagnosis not present

## 2021-07-22 DIAGNOSIS — Z992 Dependence on renal dialysis: Secondary | ICD-10-CM | POA: Diagnosis not present

## 2021-07-22 DIAGNOSIS — Z510 Encounter for antineoplastic radiation therapy: Secondary | ICD-10-CM | POA: Diagnosis not present

## 2021-07-22 DIAGNOSIS — Z191 Hormone sensitive malignancy status: Secondary | ICD-10-CM | POA: Diagnosis not present

## 2021-07-22 DIAGNOSIS — D631 Anemia in chronic kidney disease: Secondary | ICD-10-CM | POA: Diagnosis not present

## 2021-07-22 DIAGNOSIS — D509 Iron deficiency anemia, unspecified: Secondary | ICD-10-CM | POA: Diagnosis not present

## 2021-07-22 DIAGNOSIS — N2581 Secondary hyperparathyroidism of renal origin: Secondary | ICD-10-CM | POA: Diagnosis not present

## 2021-07-22 DIAGNOSIS — N186 End stage renal disease: Secondary | ICD-10-CM | POA: Diagnosis not present

## 2021-07-22 LAB — RAD ONC ARIA SESSION SUMMARY
Course Elapsed Days: 19
Plan Fractions Treated to Date: 12
Plan Prescribed Dose Per Fraction: 1.8 Gy
Plan Total Fractions Prescribed: 25
Plan Total Prescribed Dose: 45 Gy
Reference Point Dosage Given to Date: 21.6 Gy
Reference Point Session Dosage Given: 1.8 Gy
Session Number: 12

## 2021-07-23 ENCOUNTER — Other Ambulatory Visit: Payer: Self-pay

## 2021-07-23 ENCOUNTER — Ambulatory Visit
Admission: RE | Admit: 2021-07-23 | Discharge: 2021-07-23 | Disposition: A | Discharge status: Home / Self Care | Payer: Medicare Other | Source: Ambulatory Visit | Attending: Radiation Oncology | Admitting: Radiation Oncology

## 2021-07-23 ENCOUNTER — Ambulatory Visit: Payer: Medicare Other

## 2021-07-23 DIAGNOSIS — Z191 Hormone sensitive malignancy status: Secondary | ICD-10-CM | POA: Diagnosis not present

## 2021-07-23 DIAGNOSIS — C61 Malignant neoplasm of prostate: Secondary | ICD-10-CM | POA: Diagnosis not present

## 2021-07-23 DIAGNOSIS — Z510 Encounter for antineoplastic radiation therapy: Secondary | ICD-10-CM | POA: Diagnosis not present

## 2021-07-23 LAB — RAD ONC ARIA SESSION SUMMARY
Course Elapsed Days: 20
Plan Fractions Treated to Date: 13
Plan Prescribed Dose Per Fraction: 1.8 Gy
Plan Total Fractions Prescribed: 25
Plan Total Prescribed Dose: 45 Gy
Reference Point Dosage Given to Date: 23.4 Gy
Reference Point Session Dosage Given: 1.8 Gy
Session Number: 13

## 2021-07-23 NOTE — Chronic Care Management (AMB) (Signed)
Chronic Care Management   CCM RN Visit Note  07/23/2021 Name: James Wall MRN: [id number] DOB: November 24, 1965  Subjective: James Wall is a 56 y.o. year old male who is a primary care patient of Cresenzo, Angelyn Punt, MD. The care management team was consulted for assistance with disease management and care coordination needs.    Engaged with patient by telephone for follow up visit in response to provider referral for case management and/or care coordination services.   Consent to Services:  The patient was given information about Chronic Care Management services, agreed to services, and gave verbal consent prior to initiation of services.  Please see initial visit note for detailed documentation.   Patient agreed to services and verbal consent obtained.    Summary:  The patient continues to maintain positive progress with care plan goals. See Care Plan below for interventions and patient self-care actives.  Recommendation: The patient may benefit from taking medications as prescribed, checking blood pressures and record values, calling your physician if numbers are abnormal, and The patient agrees.  Follow up Plan: Patient would like continued follow-up.  CM RNCM will outreach the patient within the next 8 weeks.  Patient will call office if needed prior to next encounter   Assessment: Review of patient past medical history, allergies, medications, health status, including review of consultants reports, laboratory and other test data, was performed as part of comprehensive evaluation and provision of chronic care management services.   SDOH (Social Determinants of Health) assessments and interventions performed:  No  CCM Care Plan    Conditions to be addressed/monitored:HTN  Care Plan : RN Case Manager  Updates made by Lazaro Arms, RN since 07/23/2021 12:00 AM     Problem: General Plan of Care in a patiednt with Hypertention      Goal: The patient will learn to  monitor and manage his blood pressure through lifestyle modigifcations to prevet further complications   Start Date: 06/04/2021  Expected End Date: 06/05/2022  Priority: High  Note:   Current Barriers:  Chronic Disease Management support and education needs related to HTN  RNCM Clinical Goal(s):  Patient will verbalize basic understanding of HTN disease process and self health management plan as evidenced by keeping his blood pressure within acceptable levels  through collaboration with RN Care manager, provider, and care team.   Interventions: 1:1 collaboration with primary care provider regarding development and update of comprehensive plan of care as evidenced by provider attestation and co-signature Inter-disciplinary care team collaboration (see longitudinal plan of care) Evaluation of current treatment plan related to  self management and patient's adherence to plan as established by provider   Hypertension: (Status: New goal.) Long Term Goal  Last practice recorded BP readings:  BP Readings from Last 3 Encounters:  07/18/21 (!) 161/77  07/01/21 (!) 163/91  06/11/21 (!) 176/96  Most recent eGFR/CrCl: No results found for: "EGFR"  No components found for: "CRCL"  Evaluation of current treatment plan related to hypertension self management and patient's adherence to plan as established by provider;   Discussed plans with patient for ongoing care management follow up and provided patient with direct contact information for care management team; Advised patient, providing education and rationale, to monitor blood pressure daily and record, calling PCP for findings outside established parameters;  07/23/21:  Today, [name] is feeling exhausted as he underwent radiation treatment. However, he can communicate and reports that his blood pressure typically remains stable when he does not undergo dialysis.  He has not been experiencing any headaches, chest pain, or flushing. Additionally, he  received a blood transfusion last Thursday due to a low blood count, which improved his condition. However, it did not increase his energy levels as much as he expected. [name] has been experiencing blurred vision and intends to schedule an eye appointment, but he plans to wait until his final radiation treatment in July.   Patient Goals/Self Care Activities: -Patient/Caregiver will self-administer medications as prescribed as evidenced by self-report/primary caregiver report  -Patient/Caregiver will attend all scheduled provider appointments as evidenced by clinician review of documented attendance to scheduled appointments and patient/caregiver report -Patient/Caregiver will call pharmacy for medication refills as evidenced by patient report and review of pharmacy fill history as appropriate -Patient/Caregiver will call provider office for new concerns or questions as evidenced by review of documented incoming telephone call notes and patient report -Patient/Caregiver verbalizes understanding of plan -Patient/Caregiver will focus on medication adherence by taking medications as prescribed  -Calls provider office for new concerns, questions, or BP outside discussed parameters -Checks BP and records as discussed -Follows a low sodium diet/DASH diet        Lazaro Arms RN, BSN, [place] Management Coordinator [facility]  Phone: [phone]

## 2021-07-23 NOTE — Patient Instructions (Signed)
Visit Information  James Wall  it was nice speaking with you. Please call me directly [phone] if you have questions about the goals we discussed.    Patient Goals/Self Care Activities: -Patient/Caregiver will self-administer medications as prescribed as evidenced by self-report/primary caregiver report  -Patient/Caregiver will attend all scheduled provider appointments as evidenced by clinician review of documented attendance to scheduled appointments and patient/caregiver report -Patient/Caregiver will call pharmacy for medication refills as evidenced by patient report and review of pharmacy fill history as appropriate -Patient/Caregiver will call provider office for new concerns or questions as evidenced by review of documented incoming telephone call notes and patient report -Patient/Caregiver verbalizes understanding of plan -Patient/Caregiver will focus on medication adherence by taking medications as prescribed  -Calls provider office for new concerns, questions, or BP outside discussed parameters -Checks BP and records as discussed -Follows a low sodium diet/DASH diet        Patient verbalizes understanding of instructions and care plan provided today and agrees to view in [place]. Active MyChart status and patient understanding of how to access instructions and care plan via MyChart confirmed with patient.     Follow up Plan: Patient would like continued follow-up.  CM RNCM will outreach the patient within the next 8 weeks  Patient will call office if needed prior to next encounter  Lazaro Arms, RN  [phone]

## 2021-07-24 ENCOUNTER — Other Ambulatory Visit: Payer: Self-pay

## 2021-07-24 ENCOUNTER — Ambulatory Visit
Admission: RE | Admit: 2021-07-24 | Discharge: 2021-07-24 | Disposition: A | Discharge status: Home / Self Care | Payer: Medicare Other | Source: Ambulatory Visit | Attending: Radiation Oncology | Admitting: Radiation Oncology

## 2021-07-24 DIAGNOSIS — C61 Malignant neoplasm of prostate: Secondary | ICD-10-CM | POA: Diagnosis not present

## 2021-07-24 DIAGNOSIS — Z191 Hormone sensitive malignancy status: Secondary | ICD-10-CM | POA: Diagnosis not present

## 2021-07-24 DIAGNOSIS — J158 Pneumonia due to other specified bacteria: Secondary | ICD-10-CM | POA: Diagnosis not present

## 2021-07-24 DIAGNOSIS — D631 Anemia in chronic kidney disease: Secondary | ICD-10-CM | POA: Diagnosis not present

## 2021-07-24 DIAGNOSIS — Z992 Dependence on renal dialysis: Secondary | ICD-10-CM | POA: Diagnosis not present

## 2021-07-24 DIAGNOSIS — D509 Iron deficiency anemia, unspecified: Secondary | ICD-10-CM | POA: Diagnosis not present

## 2021-07-24 DIAGNOSIS — Z510 Encounter for antineoplastic radiation therapy: Secondary | ICD-10-CM | POA: Diagnosis not present

## 2021-07-24 DIAGNOSIS — N186 End stage renal disease: Secondary | ICD-10-CM | POA: Diagnosis not present

## 2021-07-24 DIAGNOSIS — N2581 Secondary hyperparathyroidism of renal origin: Secondary | ICD-10-CM | POA: Diagnosis not present

## 2021-07-24 LAB — RAD ONC ARIA SESSION SUMMARY
Course Elapsed Days: 21
Plan Fractions Treated to Date: 14
Plan Prescribed Dose Per Fraction: 1.8 Gy
Plan Total Fractions Prescribed: 25
Plan Total Prescribed Dose: 45 Gy
Reference Point Dosage Given to Date: 25.2 Gy
Reference Point Session Dosage Given: 1.8 Gy
Session Number: 14

## 2021-07-25 ENCOUNTER — Other Ambulatory Visit: Payer: Self-pay

## 2021-07-25 ENCOUNTER — Ambulatory Visit
Admission: RE | Admit: 2021-07-25 | Discharge: 2021-07-25 | Disposition: A | Discharge status: Home / Self Care | Payer: Medicare Other | Source: Ambulatory Visit | Attending: Radiation Oncology | Admitting: Radiation Oncology

## 2021-07-25 DIAGNOSIS — Z191 Hormone sensitive malignancy status: Secondary | ICD-10-CM | POA: Diagnosis not present

## 2021-07-25 DIAGNOSIS — C61 Malignant neoplasm of prostate: Secondary | ICD-10-CM | POA: Diagnosis not present

## 2021-07-25 DIAGNOSIS — Z510 Encounter for antineoplastic radiation therapy: Secondary | ICD-10-CM | POA: Diagnosis not present

## 2021-07-25 LAB — RAD ONC ARIA SESSION SUMMARY
Course Elapsed Days: 22
Plan Fractions Treated to Date: 15
Plan Prescribed Dose Per Fraction: 1.8 Gy
Plan Total Fractions Prescribed: 25
Plan Total Prescribed Dose: 45 Gy
Reference Point Dosage Given to Date: 27 Gy
Reference Point Session Dosage Given: 1.8 Gy
Session Number: 15

## 2021-07-26 ENCOUNTER — Ambulatory Visit
Admission: RE | Admit: 2021-07-26 | Discharge: 2021-07-26 | Disposition: A | Discharge status: Home / Self Care | Payer: Medicare Other | Source: Ambulatory Visit | Attending: Radiation Oncology | Admitting: Radiation Oncology

## 2021-07-26 ENCOUNTER — Other Ambulatory Visit: Payer: Self-pay

## 2021-07-26 DIAGNOSIS — Z191 Hormone sensitive malignancy status: Secondary | ICD-10-CM | POA: Diagnosis not present

## 2021-07-26 DIAGNOSIS — N2581 Secondary hyperparathyroidism of renal origin: Secondary | ICD-10-CM | POA: Diagnosis not present

## 2021-07-26 DIAGNOSIS — Z992 Dependence on renal dialysis: Secondary | ICD-10-CM | POA: Diagnosis not present

## 2021-07-26 DIAGNOSIS — C61 Malignant neoplasm of prostate: Secondary | ICD-10-CM | POA: Diagnosis not present

## 2021-07-26 DIAGNOSIS — D509 Iron deficiency anemia, unspecified: Secondary | ICD-10-CM | POA: Diagnosis not present

## 2021-07-26 DIAGNOSIS — J158 Pneumonia due to other specified bacteria: Secondary | ICD-10-CM | POA: Diagnosis not present

## 2021-07-26 DIAGNOSIS — D631 Anemia in chronic kidney disease: Secondary | ICD-10-CM | POA: Diagnosis not present

## 2021-07-26 DIAGNOSIS — Z510 Encounter for antineoplastic radiation therapy: Secondary | ICD-10-CM | POA: Diagnosis not present

## 2021-07-26 DIAGNOSIS — N186 End stage renal disease: Secondary | ICD-10-CM | POA: Diagnosis not present

## 2021-07-26 LAB — RAD ONC ARIA SESSION SUMMARY
Course Elapsed Days: 23
Plan Fractions Treated to Date: 16
Plan Prescribed Dose Per Fraction: 1.8 Gy
Plan Total Fractions Prescribed: 25
Plan Total Prescribed Dose: 45 Gy
Reference Point Dosage Given to Date: 28.8 Gy
Reference Point Session Dosage Given: 1.8 Gy
Session Number: 16

## 2021-07-29 ENCOUNTER — Other Ambulatory Visit: Payer: Self-pay

## 2021-07-29 ENCOUNTER — Inpatient Hospital Stay: Payer: Medicare Other

## 2021-07-29 ENCOUNTER — Ambulatory Visit
Admission: RE | Admit: 2021-07-29 | Discharge: 2021-07-29 | Disposition: A | Discharge status: Home / Self Care | Payer: Medicare Other | Source: Ambulatory Visit | Attending: Radiation Oncology | Admitting: Radiation Oncology

## 2021-07-29 DIAGNOSIS — J158 Pneumonia due to other specified bacteria: Secondary | ICD-10-CM | POA: Diagnosis not present

## 2021-07-29 DIAGNOSIS — Z510 Encounter for antineoplastic radiation therapy: Secondary | ICD-10-CM | POA: Diagnosis not present

## 2021-07-29 DIAGNOSIS — Z992 Dependence on renal dialysis: Secondary | ICD-10-CM | POA: Diagnosis not present

## 2021-07-29 DIAGNOSIS — D631 Anemia in chronic kidney disease: Secondary | ICD-10-CM | POA: Diagnosis not present

## 2021-07-29 DIAGNOSIS — N186 End stage renal disease: Secondary | ICD-10-CM | POA: Diagnosis not present

## 2021-07-29 DIAGNOSIS — D509 Iron deficiency anemia, unspecified: Secondary | ICD-10-CM | POA: Diagnosis not present

## 2021-07-29 DIAGNOSIS — C61 Malignant neoplasm of prostate: Secondary | ICD-10-CM | POA: Diagnosis not present

## 2021-07-29 DIAGNOSIS — Z191 Hormone sensitive malignancy status: Secondary | ICD-10-CM | POA: Diagnosis not present

## 2021-07-29 DIAGNOSIS — N2581 Secondary hyperparathyroidism of renal origin: Secondary | ICD-10-CM | POA: Diagnosis not present

## 2021-07-29 LAB — RAD ONC ARIA SESSION SUMMARY
Course Elapsed Days: 26
Plan Fractions Treated to Date: 17
Plan Prescribed Dose Per Fraction: 1.8 Gy
Plan Total Fractions Prescribed: 25
Plan Total Prescribed Dose: 45 Gy
Reference Point Dosage Given to Date: 30.6 Gy
Reference Point Session Dosage Given: 1.8 Gy
Session Number: 17

## 2021-07-30 ENCOUNTER — Inpatient Hospital Stay: Payer: Medicare Other

## 2021-07-30 ENCOUNTER — Other Ambulatory Visit: Payer: Self-pay

## 2021-07-30 ENCOUNTER — Ambulatory Visit
Admission: RE | Admit: 2021-07-30 | Discharge: 2021-07-30 | Disposition: A | Discharge status: Home / Self Care | Payer: Medicare Other | Source: Ambulatory Visit | Attending: Radiation Oncology | Admitting: Radiation Oncology

## 2021-07-30 DIAGNOSIS — Z191 Hormone sensitive malignancy status: Secondary | ICD-10-CM | POA: Diagnosis not present

## 2021-07-30 DIAGNOSIS — C61 Malignant neoplasm of prostate: Secondary | ICD-10-CM | POA: Diagnosis not present

## 2021-07-30 DIAGNOSIS — Z510 Encounter for antineoplastic radiation therapy: Secondary | ICD-10-CM | POA: Diagnosis not present

## 2021-07-30 LAB — RAD ONC ARIA SESSION SUMMARY
Course Elapsed Days: 27
Plan Fractions Treated to Date: 18
Plan Prescribed Dose Per Fraction: 1.8 Gy
Plan Total Fractions Prescribed: 25
Plan Total Prescribed Dose: 45 Gy
Reference Point Dosage Given to Date: 32.4 Gy
Reference Point Session Dosage Given: 1.8 Gy
Session Number: 18

## 2021-07-31 ENCOUNTER — Other Ambulatory Visit: Payer: Self-pay

## 2021-07-31 ENCOUNTER — Inpatient Hospital Stay: Payer: Medicare Other

## 2021-07-31 ENCOUNTER — Ambulatory Visit
Admission: RE | Admit: 2021-07-31 | Discharge: 2021-07-31 | Disposition: A | Discharge status: Home / Self Care | Payer: Medicare Other | Source: Ambulatory Visit | Attending: Radiation Oncology | Admitting: Radiation Oncology

## 2021-07-31 DIAGNOSIS — Z191 Hormone sensitive malignancy status: Secondary | ICD-10-CM | POA: Diagnosis not present

## 2021-07-31 DIAGNOSIS — N2581 Secondary hyperparathyroidism of renal origin: Secondary | ICD-10-CM | POA: Diagnosis not present

## 2021-07-31 DIAGNOSIS — D509 Iron deficiency anemia, unspecified: Secondary | ICD-10-CM | POA: Diagnosis not present

## 2021-07-31 DIAGNOSIS — D631 Anemia in chronic kidney disease: Secondary | ICD-10-CM | POA: Diagnosis not present

## 2021-07-31 DIAGNOSIS — C61 Malignant neoplasm of prostate: Secondary | ICD-10-CM | POA: Diagnosis not present

## 2021-07-31 DIAGNOSIS — N186 End stage renal disease: Secondary | ICD-10-CM | POA: Diagnosis not present

## 2021-07-31 DIAGNOSIS — Z992 Dependence on renal dialysis: Secondary | ICD-10-CM | POA: Diagnosis not present

## 2021-07-31 DIAGNOSIS — J158 Pneumonia due to other specified bacteria: Secondary | ICD-10-CM | POA: Diagnosis not present

## 2021-07-31 DIAGNOSIS — Z510 Encounter for antineoplastic radiation therapy: Secondary | ICD-10-CM | POA: Diagnosis not present

## 2021-07-31 LAB — RAD ONC ARIA SESSION SUMMARY
Course Elapsed Days: 28
Plan Fractions Treated to Date: 19
Plan Prescribed Dose Per Fraction: 1.8 Gy
Plan Total Fractions Prescribed: 25
Plan Total Prescribed Dose: 45 Gy
Reference Point Dosage Given to Date: 34.2 Gy
Reference Point Session Dosage Given: 1.8 Gy
Session Number: 19

## 2021-08-01 ENCOUNTER — Other Ambulatory Visit: Payer: Self-pay

## 2021-08-01 ENCOUNTER — Ambulatory Visit
Admission: RE | Admit: 2021-08-01 | Discharge: 2021-08-01 | Disposition: A | Discharge status: Home / Self Care | Payer: Medicare Other | Source: Ambulatory Visit | Attending: Radiation Oncology | Admitting: Radiation Oncology

## 2021-08-01 DIAGNOSIS — C61 Malignant neoplasm of prostate: Secondary | ICD-10-CM | POA: Diagnosis not present

## 2021-08-01 DIAGNOSIS — Z510 Encounter for antineoplastic radiation therapy: Secondary | ICD-10-CM | POA: Diagnosis not present

## 2021-08-01 DIAGNOSIS — Z191 Hormone sensitive malignancy status: Secondary | ICD-10-CM | POA: Diagnosis not present

## 2021-08-01 LAB — RAD ONC ARIA SESSION SUMMARY
Course Elapsed Days: 29
Plan Fractions Treated to Date: 20
Plan Prescribed Dose Per Fraction: 1.8 Gy
Plan Total Fractions Prescribed: 25
Plan Total Prescribed Dose: 45 Gy
Reference Point Dosage Given to Date: 36 Gy
Reference Point Session Dosage Given: 1.8 Gy
Session Number: 20

## 2021-08-02 ENCOUNTER — Ambulatory Visit
Admission: RE | Admit: 2021-08-02 | Discharge: 2021-08-02 | Disposition: A | Discharge status: Home / Self Care | Payer: Medicare Other | Source: Ambulatory Visit | Attending: Radiation Oncology | Admitting: Radiation Oncology

## 2021-08-02 ENCOUNTER — Other Ambulatory Visit: Payer: Self-pay

## 2021-08-02 DIAGNOSIS — N186 End stage renal disease: Secondary | ICD-10-CM | POA: Diagnosis not present

## 2021-08-02 DIAGNOSIS — Z510 Encounter for antineoplastic radiation therapy: Secondary | ICD-10-CM | POA: Diagnosis not present

## 2021-08-02 DIAGNOSIS — D509 Iron deficiency anemia, unspecified: Secondary | ICD-10-CM | POA: Diagnosis not present

## 2021-08-02 DIAGNOSIS — N2581 Secondary hyperparathyroidism of renal origin: Secondary | ICD-10-CM | POA: Diagnosis not present

## 2021-08-02 DIAGNOSIS — Z191 Hormone sensitive malignancy status: Secondary | ICD-10-CM | POA: Diagnosis not present

## 2021-08-02 DIAGNOSIS — C61 Malignant neoplasm of prostate: Secondary | ICD-10-CM | POA: Diagnosis not present

## 2021-08-02 DIAGNOSIS — J158 Pneumonia due to other specified bacteria: Secondary | ICD-10-CM | POA: Diagnosis not present

## 2021-08-02 DIAGNOSIS — D631 Anemia in chronic kidney disease: Secondary | ICD-10-CM | POA: Diagnosis not present

## 2021-08-02 DIAGNOSIS — Z992 Dependence on renal dialysis: Secondary | ICD-10-CM | POA: Diagnosis not present

## 2021-08-02 LAB — RAD ONC ARIA SESSION SUMMARY
Course Elapsed Days: 30
Plan Fractions Treated to Date: 21
Plan Prescribed Dose Per Fraction: 1.8 Gy
Plan Total Fractions Prescribed: 25
Plan Total Prescribed Dose: 45 Gy
Reference Point Dosage Given to Date: 37.8 Gy
Reference Point Session Dosage Given: 1.8 Gy
Session Number: 21

## 2021-08-03 DIAGNOSIS — N186 End stage renal disease: Secondary | ICD-10-CM | POA: Diagnosis not present

## 2021-08-03 DIAGNOSIS — Z992 Dependence on renal dialysis: Secondary | ICD-10-CM | POA: Diagnosis not present

## 2021-08-03 DIAGNOSIS — I12 Hypertensive chronic kidney disease with 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: Secondary | ICD-10-CM | POA: Diagnosis not present

## 2021-08-05 ENCOUNTER — Other Ambulatory Visit: Payer: Self-pay

## 2021-08-05 ENCOUNTER — Ambulatory Visit
Admission: RE | Admit: 2021-08-05 | Discharge: 2021-08-05 | Disposition: A | Discharge status: Home / Self Care | Payer: Medicare Other | Source: Ambulatory Visit | Attending: Radiation Oncology | Admitting: Radiation Oncology

## 2021-08-05 DIAGNOSIS — C61 Malignant neoplasm of prostate: Secondary | ICD-10-CM | POA: Diagnosis not present

## 2021-08-05 DIAGNOSIS — N186 End stage renal disease: Secondary | ICD-10-CM | POA: Diagnosis not present

## 2021-08-05 DIAGNOSIS — Z191 Hormone sensitive malignancy status: Secondary | ICD-10-CM | POA: Diagnosis not present

## 2021-08-05 DIAGNOSIS — Z992 Dependence on renal dialysis: Secondary | ICD-10-CM | POA: Diagnosis not present

## 2021-08-05 DIAGNOSIS — Z510 Encounter for antineoplastic radiation therapy: Secondary | ICD-10-CM | POA: Insufficient documentation

## 2021-08-05 DIAGNOSIS — J158 Pneumonia due to other specified bacteria: Secondary | ICD-10-CM | POA: Diagnosis not present

## 2021-08-05 DIAGNOSIS — D631 Anemia in chronic kidney disease: Secondary | ICD-10-CM | POA: Diagnosis not present

## 2021-08-05 DIAGNOSIS — D509 Iron deficiency anemia, unspecified: Secondary | ICD-10-CM | POA: Diagnosis not present

## 2021-08-05 DIAGNOSIS — N2581 Secondary hyperparathyroidism of renal origin: Secondary | ICD-10-CM | POA: Diagnosis not present

## 2021-08-05 LAB — RAD ONC ARIA SESSION SUMMARY
Course Elapsed Days: 33
Plan Fractions Treated to Date: 22
Plan Prescribed Dose Per Fraction: 1.8 Gy
Plan Total Fractions Prescribed: 25
Plan Total Prescribed Dose: 45 Gy
Reference Point Dosage Given to Date: 39.6 Gy
Reference Point Session Dosage Given: 1.8 Gy
Session Number: 22

## 2021-08-07 ENCOUNTER — Other Ambulatory Visit: Payer: Self-pay

## 2021-08-07 ENCOUNTER — Ambulatory Visit
Admission: RE | Admit: 2021-08-07 | Discharge: 2021-08-07 | Disposition: A | Discharge status: Home / Self Care | Payer: Medicare Other | Source: Ambulatory Visit | Attending: Radiation Oncology | Admitting: Radiation Oncology

## 2021-08-07 DIAGNOSIS — D631 Anemia in chronic kidney disease: Secondary | ICD-10-CM | POA: Diagnosis not present

## 2021-08-07 DIAGNOSIS — Z510 Encounter for antineoplastic radiation therapy: Secondary | ICD-10-CM | POA: Diagnosis not present

## 2021-08-07 DIAGNOSIS — N186 End stage renal disease: Secondary | ICD-10-CM | POA: Diagnosis not present

## 2021-08-07 DIAGNOSIS — N2581 Secondary hyperparathyroidism of renal origin: Secondary | ICD-10-CM | POA: Diagnosis not present

## 2021-08-07 DIAGNOSIS — D509 Iron deficiency anemia, unspecified: Secondary | ICD-10-CM | POA: Diagnosis not present

## 2021-08-07 DIAGNOSIS — Z191 Hormone sensitive malignancy status: Secondary | ICD-10-CM | POA: Diagnosis not present

## 2021-08-07 DIAGNOSIS — Z992 Dependence on renal dialysis: Secondary | ICD-10-CM | POA: Diagnosis not present

## 2021-08-07 DIAGNOSIS — J158 Pneumonia due to other specified bacteria: Secondary | ICD-10-CM | POA: Diagnosis not present

## 2021-08-07 DIAGNOSIS — C61 Malignant neoplasm of prostate: Secondary | ICD-10-CM | POA: Diagnosis not present

## 2021-08-07 LAB — RAD ONC ARIA SESSION SUMMARY
Course Elapsed Days: 35
Plan Fractions Treated to Date: 23
Plan Prescribed Dose Per Fraction: 1.8 Gy
Plan Total Fractions Prescribed: 25
Plan Total Prescribed Dose: 45 Gy
Reference Point Dosage Given to Date: 41.4 Gy
Reference Point Session Dosage Given: 1.8 Gy
Session Number: 23

## 2021-08-08 ENCOUNTER — Other Ambulatory Visit: Payer: Self-pay

## 2021-08-08 ENCOUNTER — Ambulatory Visit
Admission: RE | Admit: 2021-08-08 | Discharge: 2021-08-08 | Disposition: A | Discharge status: Home / Self Care | Payer: Medicare Other | Source: Ambulatory Visit | Attending: Radiation Oncology | Admitting: Radiation Oncology

## 2021-08-08 DIAGNOSIS — C61 Malignant neoplasm of prostate: Secondary | ICD-10-CM | POA: Diagnosis not present

## 2021-08-08 DIAGNOSIS — Z510 Encounter for antineoplastic radiation therapy: Secondary | ICD-10-CM | POA: Diagnosis not present

## 2021-08-08 DIAGNOSIS — Z191 Hormone sensitive malignancy status: Secondary | ICD-10-CM | POA: Diagnosis not present

## 2021-08-08 LAB — RAD ONC ARIA SESSION SUMMARY
Course Elapsed Days: 36
Plan Fractions Treated to Date: 24
Plan Prescribed Dose Per Fraction: 1.8 Gy
Plan Total Fractions Prescribed: 25
Plan Total Prescribed Dose: 45 Gy
Reference Point Dosage Given to Date: 43.2 Gy
Reference Point Session Dosage Given: 1.8 Gy
Session Number: 24

## 2021-08-09 ENCOUNTER — Ambulatory Visit: Payer: Medicare Other

## 2021-08-09 DIAGNOSIS — D509 Iron deficiency anemia, unspecified: Secondary | ICD-10-CM | POA: Diagnosis not present

## 2021-08-09 DIAGNOSIS — Z992 Dependence on renal dialysis: Secondary | ICD-10-CM | POA: Diagnosis not present

## 2021-08-09 DIAGNOSIS — N2581 Secondary hyperparathyroidism of renal origin: Secondary | ICD-10-CM | POA: Diagnosis not present

## 2021-08-09 DIAGNOSIS — D631 Anemia in chronic kidney disease: Secondary | ICD-10-CM | POA: Diagnosis not present

## 2021-08-09 DIAGNOSIS — N186 End stage renal disease: Secondary | ICD-10-CM | POA: Diagnosis not present

## 2021-08-09 DIAGNOSIS — J158 Pneumonia due to other specified bacteria: Secondary | ICD-10-CM | POA: Diagnosis not present

## 2021-08-12 ENCOUNTER — Ambulatory Visit: Payer: Medicare Other

## 2021-08-12 ENCOUNTER — Ambulatory Visit
Admission: RE | Admit: 2021-08-12 | Discharge: 2021-08-12 | Disposition: A | Discharge status: Home / Self Care | Payer: Medicare Other | Source: Ambulatory Visit | Attending: Radiation Oncology | Admitting: Radiation Oncology

## 2021-08-12 ENCOUNTER — Other Ambulatory Visit: Payer: Self-pay

## 2021-08-12 DIAGNOSIS — D509 Iron deficiency anemia, unspecified: Secondary | ICD-10-CM | POA: Diagnosis not present

## 2021-08-12 DIAGNOSIS — C61 Malignant neoplasm of prostate: Secondary | ICD-10-CM | POA: Diagnosis not present

## 2021-08-12 DIAGNOSIS — Z992 Dependence on renal dialysis: Secondary | ICD-10-CM | POA: Diagnosis not present

## 2021-08-12 DIAGNOSIS — N186 End stage renal disease: Secondary | ICD-10-CM | POA: Diagnosis not present

## 2021-08-12 DIAGNOSIS — D631 Anemia in chronic kidney disease: Secondary | ICD-10-CM | POA: Diagnosis not present

## 2021-08-12 DIAGNOSIS — Z510 Encounter for antineoplastic radiation therapy: Secondary | ICD-10-CM | POA: Diagnosis not present

## 2021-08-12 DIAGNOSIS — Z191 Hormone sensitive malignancy status: Secondary | ICD-10-CM | POA: Diagnosis not present

## 2021-08-12 DIAGNOSIS — N2581 Secondary hyperparathyroidism of renal origin: Secondary | ICD-10-CM | POA: Diagnosis not present

## 2021-08-12 DIAGNOSIS — T8249XA Other complication of vascular dialysis catheter, initial encounter: Secondary | ICD-10-CM | POA: Diagnosis not present

## 2021-08-12 DIAGNOSIS — J158 Pneumonia due to other specified bacteria: Secondary | ICD-10-CM | POA: Diagnosis not present

## 2021-08-12 LAB — RAD ONC ARIA SESSION SUMMARY
Course Elapsed Days: 40
Plan Fractions Treated to Date: 25
Plan Prescribed Dose Per Fraction: 1.8 Gy
Plan Total Fractions Prescribed: 25
Plan Total Prescribed Dose: 45 Gy
Reference Point Dosage Given to Date: 45 Gy
Reference Point Session Dosage Given: 1.8 Gy
Session Number: 25

## 2021-08-13 ENCOUNTER — Inpatient Hospital Stay: Payer: Medicare Other | Attending: Radiation Oncology

## 2021-08-13 ENCOUNTER — Ambulatory Visit
Admission: RE | Admit: 2021-08-13 | Discharge: 2021-08-13 | Disposition: A | Discharge status: Home / Self Care | Payer: Medicare Other | Source: Ambulatory Visit | Attending: Radiation Oncology | Admitting: Radiation Oncology

## 2021-08-13 ENCOUNTER — Other Ambulatory Visit: Payer: Self-pay

## 2021-08-13 ENCOUNTER — Ambulatory Visit: Payer: Medicare Other

## 2021-08-13 DIAGNOSIS — C61 Malignant neoplasm of prostate: Secondary | ICD-10-CM | POA: Diagnosis not present

## 2021-08-13 DIAGNOSIS — Z510 Encounter for antineoplastic radiation therapy: Secondary | ICD-10-CM | POA: Diagnosis not present

## 2021-08-13 DIAGNOSIS — Z191 Hormone sensitive malignancy status: Secondary | ICD-10-CM | POA: Diagnosis not present

## 2021-08-13 LAB — RAD ONC ARIA SESSION SUMMARY
Course Elapsed Days: 41
Plan Fractions Treated to Date: 1
Plan Prescribed Dose Per Fraction: 2 Gy
Plan Total Fractions Prescribed: 15
Plan Total Prescribed Dose: 30 Gy
Reference Point Dosage Given to Date: 47 Gy
Reference Point Session Dosage Given: 2 Gy
Session Number: 26

## 2021-08-14 ENCOUNTER — Inpatient Hospital Stay: Payer: Medicare Other

## 2021-08-14 ENCOUNTER — Ambulatory Visit
Admission: RE | Admit: 2021-08-14 | Discharge: 2021-08-14 | Disposition: A | Discharge status: Home / Self Care | Payer: Medicare Other | Source: Ambulatory Visit | Attending: Radiation Oncology | Admitting: Radiation Oncology

## 2021-08-14 ENCOUNTER — Other Ambulatory Visit: Payer: Self-pay

## 2021-08-14 DIAGNOSIS — D631 Anemia in chronic kidney disease: Secondary | ICD-10-CM | POA: Diagnosis not present

## 2021-08-14 DIAGNOSIS — C61 Malignant neoplasm of prostate: Secondary | ICD-10-CM | POA: Diagnosis not present

## 2021-08-14 DIAGNOSIS — D509 Iron deficiency anemia, unspecified: Secondary | ICD-10-CM | POA: Diagnosis not present

## 2021-08-14 DIAGNOSIS — N186 End stage renal disease: Secondary | ICD-10-CM | POA: Diagnosis not present

## 2021-08-14 DIAGNOSIS — N2581 Secondary hyperparathyroidism of renal origin: Secondary | ICD-10-CM | POA: Diagnosis not present

## 2021-08-14 DIAGNOSIS — Z992 Dependence on renal dialysis: Secondary | ICD-10-CM | POA: Diagnosis not present

## 2021-08-14 DIAGNOSIS — Z191 Hormone sensitive malignancy status: Secondary | ICD-10-CM | POA: Diagnosis not present

## 2021-08-14 DIAGNOSIS — J158 Pneumonia due to other specified bacteria: Secondary | ICD-10-CM | POA: Diagnosis not present

## 2021-08-14 DIAGNOSIS — Z510 Encounter for antineoplastic radiation therapy: Secondary | ICD-10-CM | POA: Diagnosis not present

## 2021-08-14 LAB — RAD ONC ARIA SESSION SUMMARY
Course Elapsed Days: 42
Plan Fractions Treated to Date: 2
Plan Prescribed Dose Per Fraction: 2 Gy
Plan Total Fractions Prescribed: 15
Plan Total Prescribed Dose: 30 Gy
Reference Point Dosage Given to Date: 49 Gy
Reference Point Session Dosage Given: 2 Gy
Session Number: 27

## 2021-08-15 ENCOUNTER — Ambulatory Visit
Admission: RE | Admit: 2021-08-15 | Discharge: 2021-08-15 | Disposition: A | Discharge status: Home / Self Care | Payer: Medicare Other | Source: Ambulatory Visit | Attending: Radiation Oncology | Admitting: Radiation Oncology

## 2021-08-15 ENCOUNTER — Other Ambulatory Visit: Payer: Self-pay

## 2021-08-15 ENCOUNTER — Inpatient Hospital Stay: Payer: Medicare Other

## 2021-08-15 DIAGNOSIS — Z510 Encounter for antineoplastic radiation therapy: Secondary | ICD-10-CM | POA: Diagnosis not present

## 2021-08-15 DIAGNOSIS — Z191 Hormone sensitive malignancy status: Secondary | ICD-10-CM | POA: Diagnosis not present

## 2021-08-15 DIAGNOSIS — C61 Malignant neoplasm of prostate: Secondary | ICD-10-CM | POA: Diagnosis not present

## 2021-08-15 LAB — RAD ONC ARIA SESSION SUMMARY
Course Elapsed Days: 43
Plan Fractions Treated to Date: 3
Plan Prescribed Dose Per Fraction: 2 Gy
Plan Total Fractions Prescribed: 15
Plan Total Prescribed Dose: 30 Gy
Reference Point Dosage Given to Date: 51 Gy
Reference Point Session Dosage Given: 2 Gy
Session Number: 28

## 2021-08-16 ENCOUNTER — Other Ambulatory Visit: Payer: Self-pay

## 2021-08-16 ENCOUNTER — Ambulatory Visit: Payer: Medicare Other

## 2021-08-16 ENCOUNTER — Ambulatory Visit
Admission: RE | Admit: 2021-08-16 | Discharge: 2021-08-16 | Disposition: A | Discharge status: Home / Self Care | Payer: Medicare Other | Source: Ambulatory Visit | Attending: Radiation Oncology | Admitting: Radiation Oncology

## 2021-08-16 DIAGNOSIS — N2581 Secondary hyperparathyroidism of renal origin: Secondary | ICD-10-CM | POA: Diagnosis not present

## 2021-08-16 DIAGNOSIS — J158 Pneumonia due to other specified bacteria: Secondary | ICD-10-CM | POA: Diagnosis not present

## 2021-08-16 DIAGNOSIS — Z510 Encounter for antineoplastic radiation therapy: Secondary | ICD-10-CM | POA: Diagnosis not present

## 2021-08-16 DIAGNOSIS — D631 Anemia in chronic kidney disease: Secondary | ICD-10-CM | POA: Diagnosis not present

## 2021-08-16 DIAGNOSIS — Z191 Hormone sensitive malignancy status: Secondary | ICD-10-CM | POA: Diagnosis not present

## 2021-08-16 DIAGNOSIS — Z992 Dependence on renal dialysis: Secondary | ICD-10-CM | POA: Diagnosis not present

## 2021-08-16 DIAGNOSIS — N186 End stage renal disease: Secondary | ICD-10-CM | POA: Diagnosis not present

## 2021-08-16 DIAGNOSIS — D509 Iron deficiency anemia, unspecified: Secondary | ICD-10-CM | POA: Diagnosis not present

## 2021-08-16 DIAGNOSIS — C61 Malignant neoplasm of prostate: Secondary | ICD-10-CM | POA: Diagnosis not present

## 2021-08-16 LAB — RAD ONC ARIA SESSION SUMMARY
Course Elapsed Days: 44
Plan Fractions Treated to Date: 4
Plan Prescribed Dose Per Fraction: 2 Gy
Plan Total Fractions Prescribed: 15
Plan Total Prescribed Dose: 30 Gy
Reference Point Dosage Given to Date: 53 Gy
Reference Point Session Dosage Given: 2 Gy
Session Number: 29

## 2021-08-19 ENCOUNTER — Ambulatory Visit
Admission: RE | Admit: 2021-08-19 | Discharge: 2021-08-19 | Disposition: A | Discharge status: Home / Self Care | Payer: Medicare Other | Source: Ambulatory Visit | Attending: Radiation Oncology | Admitting: Radiation Oncology

## 2021-08-19 ENCOUNTER — Inpatient Hospital Stay: Payer: Medicare Other

## 2021-08-19 ENCOUNTER — Other Ambulatory Visit: Payer: Self-pay

## 2021-08-19 DIAGNOSIS — J158 Pneumonia due to other specified bacteria: Secondary | ICD-10-CM | POA: Diagnosis not present

## 2021-08-19 DIAGNOSIS — D631 Anemia in chronic kidney disease: Secondary | ICD-10-CM | POA: Diagnosis not present

## 2021-08-19 DIAGNOSIS — N2581 Secondary hyperparathyroidism of renal origin: Secondary | ICD-10-CM | POA: Diagnosis not present

## 2021-08-19 DIAGNOSIS — Z992 Dependence on renal dialysis: Secondary | ICD-10-CM | POA: Diagnosis not present

## 2021-08-19 DIAGNOSIS — N186 End stage renal disease: Secondary | ICD-10-CM | POA: Diagnosis not present

## 2021-08-19 DIAGNOSIS — Z510 Encounter for antineoplastic radiation therapy: Secondary | ICD-10-CM | POA: Diagnosis not present

## 2021-08-19 DIAGNOSIS — D509 Iron deficiency anemia, unspecified: Secondary | ICD-10-CM | POA: Diagnosis not present

## 2021-08-19 DIAGNOSIS — Z191 Hormone sensitive malignancy status: Secondary | ICD-10-CM | POA: Diagnosis not present

## 2021-08-19 DIAGNOSIS — C61 Malignant neoplasm of prostate: Secondary | ICD-10-CM | POA: Diagnosis not present

## 2021-08-19 LAB — RAD ONC ARIA SESSION SUMMARY
Course Elapsed Days: 47
Plan Fractions Treated to Date: 5
Plan Prescribed Dose Per Fraction: 2 Gy
Plan Total Fractions Prescribed: 15
Plan Total Prescribed Dose: 30 Gy
Reference Point Dosage Given to Date: 55 Gy
Reference Point Session Dosage Given: 2 Gy
Session Number: 30

## 2021-08-20 ENCOUNTER — Inpatient Hospital Stay: Payer: Medicare Other

## 2021-08-20 ENCOUNTER — Other Ambulatory Visit: Payer: Self-pay

## 2021-08-20 ENCOUNTER — Ambulatory Visit
Admission: RE | Admit: 2021-08-20 | Discharge: 2021-08-20 | Disposition: A | Discharge status: Home / Self Care | Payer: Medicare Other | Source: Ambulatory Visit | Attending: Radiation Oncology | Admitting: Radiation Oncology

## 2021-08-20 DIAGNOSIS — C61 Malignant neoplasm of prostate: Secondary | ICD-10-CM | POA: Diagnosis not present

## 2021-08-20 DIAGNOSIS — Z510 Encounter for antineoplastic radiation therapy: Secondary | ICD-10-CM | POA: Diagnosis not present

## 2021-08-20 DIAGNOSIS — Z191 Hormone sensitive malignancy status: Secondary | ICD-10-CM | POA: Diagnosis not present

## 2021-08-20 LAB — RAD ONC ARIA SESSION SUMMARY
Course Elapsed Days: 48
Plan Fractions Treated to Date: 6
Plan Prescribed Dose Per Fraction: 2 Gy
Plan Total Fractions Prescribed: 15
Plan Total Prescribed Dose: 30 Gy
Reference Point Dosage Given to Date: 57 Gy
Reference Point Session Dosage Given: 2 Gy
Session Number: 31

## 2021-08-21 ENCOUNTER — Inpatient Hospital Stay: Payer: Medicare Other

## 2021-08-21 ENCOUNTER — Other Ambulatory Visit: Payer: Self-pay

## 2021-08-21 ENCOUNTER — Ambulatory Visit
Admission: RE | Admit: 2021-08-21 | Discharge: 2021-08-21 | Disposition: A | Discharge status: Home / Self Care | Payer: Medicare Other | Source: Ambulatory Visit | Attending: Radiation Oncology | Admitting: Radiation Oncology

## 2021-08-21 DIAGNOSIS — Z191 Hormone sensitive malignancy status: Secondary | ICD-10-CM | POA: Diagnosis not present

## 2021-08-21 DIAGNOSIS — Z992 Dependence on renal dialysis: Secondary | ICD-10-CM | POA: Diagnosis not present

## 2021-08-21 DIAGNOSIS — N186 End stage renal disease: Secondary | ICD-10-CM | POA: Diagnosis not present

## 2021-08-21 DIAGNOSIS — D509 Iron deficiency anemia, unspecified: Secondary | ICD-10-CM | POA: Diagnosis not present

## 2021-08-21 DIAGNOSIS — Z510 Encounter for antineoplastic radiation therapy: Secondary | ICD-10-CM | POA: Diagnosis not present

## 2021-08-21 DIAGNOSIS — C61 Malignant neoplasm of prostate: Secondary | ICD-10-CM | POA: Diagnosis not present

## 2021-08-21 DIAGNOSIS — N2581 Secondary hyperparathyroidism of renal origin: Secondary | ICD-10-CM | POA: Diagnosis not present

## 2021-08-21 DIAGNOSIS — D631 Anemia in chronic kidney disease: Secondary | ICD-10-CM | POA: Diagnosis not present

## 2021-08-21 DIAGNOSIS — J158 Pneumonia due to other specified bacteria: Secondary | ICD-10-CM | POA: Diagnosis not present

## 2021-08-21 LAB — RAD ONC ARIA SESSION SUMMARY
Course Elapsed Days: 49
Plan Fractions Treated to Date: 7
Plan Prescribed Dose Per Fraction: 2 Gy
Plan Total Fractions Prescribed: 15
Plan Total Prescribed Dose: 30 Gy
Reference Point Dosage Given to Date: 59 Gy
Reference Point Session Dosage Given: 2 Gy
Session Number: 32

## 2021-08-22 ENCOUNTER — Inpatient Hospital Stay: Payer: Medicare Other

## 2021-08-22 ENCOUNTER — Other Ambulatory Visit: Payer: Self-pay

## 2021-08-22 ENCOUNTER — Ambulatory Visit
Admission: RE | Admit: 2021-08-22 | Discharge: 2021-08-22 | Disposition: A | Discharge status: Home / Self Care | Payer: Medicare Other | Source: Ambulatory Visit | Attending: Radiation Oncology | Admitting: Radiation Oncology

## 2021-08-22 DIAGNOSIS — Z510 Encounter for antineoplastic radiation therapy: Secondary | ICD-10-CM | POA: Diagnosis not present

## 2021-08-22 DIAGNOSIS — Z191 Hormone sensitive malignancy status: Secondary | ICD-10-CM | POA: Diagnosis not present

## 2021-08-22 DIAGNOSIS — C61 Malignant neoplasm of prostate: Secondary | ICD-10-CM | POA: Diagnosis not present

## 2021-08-22 LAB — RAD ONC ARIA SESSION SUMMARY
Course Elapsed Days: 50
Plan Fractions Treated to Date: 8
Plan Prescribed Dose Per Fraction: 2 Gy
Plan Total Fractions Prescribed: 15
Plan Total Prescribed Dose: 30 Gy
Reference Point Dosage Given to Date: 61 Gy
Reference Point Session Dosage Given: 2 Gy
Session Number: 33

## 2021-08-23 ENCOUNTER — Ambulatory Visit: Payer: Medicare Other

## 2021-08-23 ENCOUNTER — Other Ambulatory Visit: Payer: Self-pay

## 2021-08-23 ENCOUNTER — Ambulatory Visit
Admission: RE | Admit: 2021-08-23 | Discharge: 2021-08-23 | Disposition: A | Discharge status: Home / Self Care | Payer: Medicare Other | Source: Ambulatory Visit | Attending: Radiation Oncology | Admitting: Radiation Oncology

## 2021-08-23 ENCOUNTER — Inpatient Hospital Stay: Payer: Medicare Other

## 2021-08-23 DIAGNOSIS — N186 End stage renal disease: Secondary | ICD-10-CM | POA: Diagnosis not present

## 2021-08-23 DIAGNOSIS — Z191 Hormone sensitive malignancy status: Secondary | ICD-10-CM | POA: Diagnosis not present

## 2021-08-23 DIAGNOSIS — D631 Anemia in chronic kidney disease: Secondary | ICD-10-CM | POA: Diagnosis not present

## 2021-08-23 DIAGNOSIS — D509 Iron deficiency anemia, unspecified: Secondary | ICD-10-CM | POA: Diagnosis not present

## 2021-08-23 DIAGNOSIS — Z992 Dependence on renal dialysis: Secondary | ICD-10-CM | POA: Diagnosis not present

## 2021-08-23 DIAGNOSIS — N2581 Secondary hyperparathyroidism of renal origin: Secondary | ICD-10-CM | POA: Diagnosis not present

## 2021-08-23 DIAGNOSIS — J158 Pneumonia due to other specified bacteria: Secondary | ICD-10-CM | POA: Diagnosis not present

## 2021-08-23 DIAGNOSIS — Z510 Encounter for antineoplastic radiation therapy: Secondary | ICD-10-CM | POA: Diagnosis not present

## 2021-08-23 DIAGNOSIS — C61 Malignant neoplasm of prostate: Secondary | ICD-10-CM | POA: Diagnosis not present

## 2021-08-23 LAB — RAD ONC ARIA SESSION SUMMARY
Course Elapsed Days: 51
Plan Fractions Treated to Date: 9
Plan Prescribed Dose Per Fraction: 2 Gy
Plan Total Fractions Prescribed: 15
Plan Total Prescribed Dose: 30 Gy
Reference Point Dosage Given to Date: 63 Gy
Reference Point Session Dosage Given: 2 Gy
Session Number: 34

## 2021-08-25 NOTE — Progress Notes (Deleted)
Cardiology Office Note:    Date:  08/25/2021   ID:  James, Wall 11-Feb-1965, MRN [id number]  PCP:  Wells Guiles, DO   [facility] Providers Cardiologist:  Werner Lean, MD     Referring MD: Concepcion Living, MD   YS:AYTKZS up HF  History of Present Illness:    James Wall is a 56 y.o. male with a hx of COPD and active tobacco abuse, ESRD via HD cath, HTN, HLD, PH NOS, history of Cirrhosis, HFmrEF who presents for evaluation 08/28/20.   2022 In internal has had improvement in his LVEF.  Lost to follow up  Patient notes that he is doing ***.   Since last visit notes *** . Last ED visit for anemia (6.6) 07/01/21.  No chest pain or pressure ***.  No SOB/DOE*** and no PND/Orthopnea***.  No weight gain or leg swelling***.  No palpitations or syncope ***.  Ambulatory blood pressure ***.    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 [facility])    primary cancer is prostate   Anemia associated with chronic renal failure    Anxiety    Arthritis    Cirrhosis, nonalcoholic ([place])    per pt possible from a medication , unsure ;   last ultrasound 08-09-2020 in epic no fibrosis   COPD (chronic obstructive pulmonary disease) ([place])    Depression    ESRD on hemodialysis ([place]) 2009   Started HD Jan 2009;  ESRD secondary to hypertensive nephrosclerosis;  dialysis at [facility] on MWF   First degree heart block    GERD (gastroesophageal reflux disease)    Hiatal hernia    History of acute respiratory failure 07/2013   admission;  HCAP w/ ARF with hypoxia   History of adenomatous polyp of colon    History of ascites    s/p paracentesis 01-31-2013 (5L) and last one 03-28-2013 (2.7L)   History of community acquired pneumonia 08/08/2020   admission ; LLL , POA   History of heart murmur in childhood    History of MRSA infection 12/19/2012   hospital admission;  w/ sepsis MRSA  bacteramia AVF infection   History of sepsis 03/2017   admission;   HCAP w/ sepsis   Hyperlipidemia    Hypertension    Hypertensive heart disease    cardiologist--- dr Osborne Oman;  nuclear stress test 06-16-2013 intermediate risk w/ mid-distal anterior wall ischemia , not gated;  cardiac cath 07-13-2013 in epic showed normal coronaries and LVSF,  ef not assessed, LCEDP 68mHg   Hypertensive nephrosclerosis, 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 ([place]    Intolerance to cold    due to anemia   Malignant neoplasm prostate ([facility]    urologist--- dr bell/  radiation onologist--- dr mTammi Klippel  dx 01/ 2023,  Gleason 4+3, PSA 324  NICM ([place]ischemic [place]) [place]    followed by cardiology;   last echo in epic 09-13-2020 ef 55-60%   OSA (obstructive sleep apnea) 2009   no  longer using cpap since the yr started 2009;   sleep study in epic 05-11-2007 severe osa   PSVT (paroxysmal supraventricular tachycardia) ([place]    event monitor 08-01-2019  SR w/ SVT runs , rare PAC/ PVC   Secondary hyperparathyroidism ([place]    Wears glasses     Past Surgical History:  Procedure Laterality Date   AV FISTULA PLACEMENT Right 02/22/2013  Procedure:  CREATION  OF BRACHIAL CEPHALIC FISTULA RIGHT ARM;  Surgeon: Elam Dutch, MD;  Location: [place];  Service: Vascular;  Laterality: Right;   AV FISTULA PLACEMENT Left 08/10/2014   Procedure: BASILIC VEIN TRANSPOSITION  ARTERIOVENOUS (AV) FISTULA CREATION LEFT UPPER ARM;  Surgeon: Mal Misty, MD;  Location: [place];  Service: Vascular;  Laterality: Left;   AV FISTULA PLACEMENT, RADIOCEPHALIC Left 98/92/1194   @[facility] ;  Left lower arm   COLONOSCOPY  11/30/2018   by dr Tarri Glenn   ESOPHAGOGASTRODUODENOSCOPY (EGD) WITH PROPOFOL N/A 04/12/2013   Procedure: ESOPHAGOGASTRODUODENOSCOPY (EGD) WITH PROPOFOL;  Surgeon: Arta Silence, MD;  [facility];  Service: Endoscopy;  Laterality: N/A;   GOLD SEED IMPLANT N/A 06/11/2021    Procedure: GOLD SEED IMPLANT;  Surgeon: Janith Lima, MD;  [facility];  Service: Urology;  Laterality: N/A;   INSERTION OF DIALYSIS CATHETER N/A 12/23/2012   Procedure: INSERTION OF DIALYSIS CATHETER; ULTRASOUND GUIDED;  Surgeon: Angelia Mould, MD;  Location: [place];  Service: Vascular;  Laterality: N/A;   INSERTION OF DIALYSIS CATHETER  10/22/2015   Right IJ non-tunneled HD catheter, placed again in 2019   IR FLUORO GUIDE CV LINE RIGHT  08/18/2019   IR FLUORO GUIDE CV LINE RIGHT  10/07/2019   IR GENERIC HISTORICAL  10/22/2015   IR US GUIDE VASC ACCESS RIGHT 10/22/2015 [facility]   IR GENERIC HISTORICAL  10/22/2015   IR FLUORO GUIDE CV LINE RIGHT 10/22/2015 [facility]   IR GENERIC HISTORICAL  10/23/2015   IR FLUORO GUIDE CV LINE RIGHT 10/23/2015 Marybelle Killings, MD [facility]   LEFT HEART CATHETERIZATION WITH CORONARY ANGIOGRAM N/A 07/13/2013   Procedure: LEFT HEART CATHETERIZATION WITH CORONARY ANGIOGRAM;  Surgeon: Jettie Booze, MD;  Location: [facility] CATH LAB;  Service: Cardiovascular;  Laterality: N/A;   LIGATION OF ARTERIOVENOUS  FISTULA Left 12/22/2012   Procedure: LIGATION OF ARTERIOVENOUS  FISTULA;EXCISION OF LARGE ANEURYSMS;;  Surgeon: Elam Dutch, MD;  Location: [place];  Service: Vascular;  Laterality: Left;   SPACE OAR INSTILLATION N/A 06/11/2021   Procedure: SPACE OAR INSTILLATION;  Surgeon: Janith Lima, MD;  [facility];  Service: Urology;  Laterality: N/A;   UPPER GASTROINTESTINAL ENDOSCOPY  09/07/2018   by dr Tarri Glenn    Current Medications: No outpatient medications have been marked as taking for the 08/27/21 encounter (Appointment) with Werner Lean, MD.     Allergies:   Venofer  [ferric oxide] and Aspirin   Social History   Socioeconomic History   Marital status: Single    Spouse name: Not on file   Number of children: 0   Years of education: 12th   Highest education level: High school graduate  Occupational History    Occupation: n/a  Tobacco Use   Smoking status: Every Day    Years: 17.00    Types: Cigarettes   Smokeless tobacco: Never   Tobacco comments:    06-06-2021  per pt had quit smoking 06/ 2021 for 16 yrs , but restarted approx 10/ 2022 1pp 3-4 days  Vaping Use   Vaping Use: Never used  Substance and Sexual Activity   Alcohol use: Not Currently    Comment: occasional   Drug use: Yes    Frequency: 4.0 times per week    Types: Marijuana    Comment: 06-06-2021 per pt smokes 2-4 weekly, (half of joint "blunt")   Sexual activity: Not Currently  Other Topics Concern   Not on file  Social History Narrative   Patient lives alone in [place].    Patient has never been married and does not have any children.    Patients support system, his mother, pass away 2016.   Patient does not own his own vehicle, uses public transportation with no concerns at this time.   Social Determinants of Health   Financial Resource Strain: Low Risk  (08/01/2020)   Overall Financial Resource Strain (CARDIA)    Difficulty of Paying Living Expenses: Not very hard  Food Insecurity: No Food Insecurity (08/01/2020)   Hunger Vital Sign    Worried About Running Out of Food in the Last Year: Never true    Ran Out of Food in the Last Year: Never true  Transportation Needs: Unmet Transportation Needs (08/19/2021)   PRAPARE - Hydrologist (Medical): Yes    Lack of Transportation (Non-Medical): Yes  Physical Activity: Inactive (08/01/2020)   Exercise Vital Sign    Days of Exercise per Week: 0 days    Minutes of Exercise per Session: 0 min  Stress: No Stress Concern Present (08/01/2020)   [place]    Feeling of Stress : Only a little  Social Connections: Socially Isolated (08/01/2020)   Social Connection and Isolation Panel [NHANES]    Frequency of Communication with Friends and Family: Twice a week    Frequency of Social  Gatherings with Friends and Family: Twice a week    Attends Religious Services: Never    Marine scientist or Organizations: No    Attends Music therapist: Never    Marital Status: Never married     Family History: The patient's family history includes Asthma in his brother; Cerebrovascular Accident in his father; Congestive Heart Failure in his brother; Hypertension in his father and mother. There is no history of Stomach cancer, Rectal cancer, Esophageal cancer, or Colon cancer.  ROS:   Please see the history of present illness.     All other systems reviewed and are negative.  EKGs/Labs/Other Studies Reviewed:    The following studies were reviewed today:  EKG:   08/27/21: *** 08/28/20: SR 1st HB  Cardiac Event Monitoring: Date: 08/01/2019 Results: Patient had a min HR of 53 bpm, max HR of 148 bpm, and avg HR of 62 BPM. 7 Supraventricular Tachycardia runs occurred, the longest lasting 18 beats with an avg rate of 112 bpm.   Predominant underlying rhythm was Sinus Rhythm. Few very short runs of SVTs that don't correlate with patient's symptoms. Rare PVCs, PACs, 1 blocked sinus beat.   Continue the same management.    Transthoracic Echocardiogram: Date:09/13/2020 Results: 1. Mildly elevated gradient (peak velocity 2.3 m/s; mean gradient 10  mmHg) across aortic valve but visually opens normally.   2. Left ventricular ejection fraction, by estimation, is 55 to 60%. The  left ventricle has normal function. The left ventricle has no regional  wall motion abnormalities. There is moderate left ventricular hypertrophy.  Left ventricular diastolic  parameters are consistent with Grade I diastolic dysfunction (impaired  relaxation). Elevated left atrial pressure.   3. Right ventricular systolic function is normal. The right ventricular  size is normal. There is mildly elevated pulmonary artery systolic  pressure.   4. Left atrial size was severely dilated.    5. Venous catheter noted in right atrium.   6. The mitral valve is normal in structure. Mild mitral valve  regurgitation. No evidence of mitral  stenosis.   7. The aortic valve is tricuspid. Aortic valve regurgitation is not  visualized. Mild aortic valve sclerosis is present, with no evidence of  aortic valve stenosis.   8. The inferior vena cava is normal in size with greater than 50%  respiratory variability, suggesting right atrial pressure of 3 mmHg.   Cardiac CT: Date:03/11/2017 Results: Aortic Atherosclerosis 3VC CAC and proximal RCA calcification  Recent Labs: 07/01/2021: ALT 7; BUN 11; Creatinine, Ser 6.09; Hemoglobin 7.1; Platelets 283; Potassium 3.3; Sodium 138  Recent Lipid Panel No results found for: "CHOL", "TRIG", "HDL", "CHOLHDL", "VLDL", "LDLCALC", "LDLDIRECT"   Physical Exam:    VS:  There were no vitals taken for this visit.    Wt Readings from Last 3 Encounters:  06/11/21 180 lb (81.6 kg)  03/21/21 183 lb (83 kg)  11/01/20 178 lb 6.4 oz (80.9 kg)    Gen: *** distress, *** obese/well nourished/malnourished   Neck: No JVD, *** carotid bruit Ears: *** Frank Sign Cardiac: No Rubs or Gallops, *** Murmur, ***cardia, *** radial pulses Respiratory: Clear to auscultation bilaterally, *** effort, ***  respiratory rate GI: Soft, nontender, non-distended *** MS: No *** edema; *** moves all extremities Integument: Skin feels *** Neuro:  At time of evaluation, alert and oriented to person/place/time/situation *** Psych: Normal affect, patient feels ***   ASSESSMENT:    No diagnosis found.  PLAN:    Heart Failure Recovered Ejection Fraction  PH NOS COPD and active tobacco use (discussed cessation) *** ESRD HTN & HLD Microcytic anemia - NYHA class I, Stage C, euvolemic, etiology unclear - Diuretic regimen: per HD with Dr. Edrick Oh - continue norvasc, clonidine per primary and nephrology - hydralazine at 25 mg PO BID and isorbide dinitrate  BID 20 mg ;  return metoprolol to BID fw (so he can take after HD on dialysis)    Medication Adjustments/Labs and Tests Ordered: Current medicines are reviewed at length with the patient today.  Concerns regarding medicines are outlined above.  No orders of the defined types were placed in this encounter.    No orders of the defined types were placed in this encounter.    There are no Patient Instructions on file for this visit.   Signed, Werner Lean, MD  08/25/2021 3:17 PM    [place]

## 2021-08-26 ENCOUNTER — Ambulatory Visit: Payer: Medicare Other

## 2021-08-26 DIAGNOSIS — N2581 Secondary hyperparathyroidism of renal origin: Secondary | ICD-10-CM | POA: Diagnosis not present

## 2021-08-26 DIAGNOSIS — D631 Anemia in chronic kidney disease: Secondary | ICD-10-CM | POA: Diagnosis not present

## 2021-08-26 DIAGNOSIS — D509 Iron deficiency anemia, unspecified: Secondary | ICD-10-CM | POA: Diagnosis not present

## 2021-08-26 DIAGNOSIS — N186 End stage renal disease: Secondary | ICD-10-CM | POA: Diagnosis not present

## 2021-08-26 DIAGNOSIS — J158 Pneumonia due to other specified bacteria: Secondary | ICD-10-CM | POA: Diagnosis not present

## 2021-08-26 DIAGNOSIS — Z992 Dependence on renal dialysis: Secondary | ICD-10-CM | POA: Diagnosis not present

## 2021-08-27 ENCOUNTER — Ambulatory Visit: Payer: Medicare Other | Admitting: Internal Medicine

## 2021-08-27 ENCOUNTER — Inpatient Hospital Stay: Payer: Medicare Other

## 2021-08-27 ENCOUNTER — Ambulatory Visit
Admission: RE | Admit: 2021-08-27 | Discharge: 2021-08-27 | Disposition: A | Discharge status: Home / Self Care | Payer: Medicare Other | Source: Ambulatory Visit | Attending: Radiation Oncology | Admitting: Radiation Oncology

## 2021-08-27 ENCOUNTER — Other Ambulatory Visit: Payer: Self-pay

## 2021-08-27 DIAGNOSIS — Z510 Encounter for antineoplastic radiation therapy: Secondary | ICD-10-CM | POA: Diagnosis not present

## 2021-08-27 DIAGNOSIS — C61 Malignant neoplasm of prostate: Secondary | ICD-10-CM | POA: Diagnosis not present

## 2021-08-27 DIAGNOSIS — Z191 Hormone sensitive malignancy status: Secondary | ICD-10-CM | POA: Diagnosis not present

## 2021-08-27 LAB — RAD ONC ARIA SESSION SUMMARY
Course Elapsed Days: 55
Plan Fractions Treated to Date: 10
Plan Prescribed Dose Per Fraction: 2 Gy
Plan Total Fractions Prescribed: 15
Plan Total Prescribed Dose: 30 Gy
Reference Point Dosage Given to Date: 65 Gy
Reference Point Session Dosage Given: 2 Gy
Session Number: 35

## 2021-08-28 ENCOUNTER — Other Ambulatory Visit: Payer: Self-pay

## 2021-08-28 ENCOUNTER — Ambulatory Visit: Payer: Medicare Other

## 2021-08-28 ENCOUNTER — Other Ambulatory Visit (HOSPITAL_COMMUNITY): Payer: Self-pay | Admitting: *Deleted

## 2021-08-28 ENCOUNTER — Ambulatory Visit
Admission: RE | Admit: 2021-08-28 | Discharge: 2021-08-28 | Disposition: A | Discharge status: Home / Self Care | Payer: Medicare Other | Source: Ambulatory Visit | Attending: Radiation Oncology | Admitting: Radiation Oncology

## 2021-08-28 DIAGNOSIS — Z191 Hormone sensitive malignancy status: Secondary | ICD-10-CM | POA: Diagnosis not present

## 2021-08-28 DIAGNOSIS — N2581 Secondary hyperparathyroidism of renal origin: Secondary | ICD-10-CM | POA: Diagnosis not present

## 2021-08-28 DIAGNOSIS — Z510 Encounter for antineoplastic radiation therapy: Secondary | ICD-10-CM | POA: Diagnosis not present

## 2021-08-28 DIAGNOSIS — D649 Anemia, unspecified: Secondary | ICD-10-CM

## 2021-08-28 DIAGNOSIS — D509 Iron deficiency anemia, unspecified: Secondary | ICD-10-CM | POA: Diagnosis not present

## 2021-08-28 DIAGNOSIS — Z992 Dependence on renal dialysis: Secondary | ICD-10-CM | POA: Diagnosis not present

## 2021-08-28 DIAGNOSIS — D631 Anemia in chronic kidney disease: Secondary | ICD-10-CM | POA: Diagnosis not present

## 2021-08-28 DIAGNOSIS — N186 End stage renal disease: Secondary | ICD-10-CM | POA: Diagnosis not present

## 2021-08-28 DIAGNOSIS — J158 Pneumonia due to other specified bacteria: Secondary | ICD-10-CM | POA: Diagnosis not present

## 2021-08-28 DIAGNOSIS — C61 Malignant neoplasm of prostate: Secondary | ICD-10-CM | POA: Diagnosis not present

## 2021-08-28 LAB — RAD ONC ARIA SESSION SUMMARY
Course Elapsed Days: 56
Plan Fractions Treated to Date: 11
Plan Prescribed Dose Per Fraction: 2 Gy
Plan Total Fractions Prescribed: 15
Plan Total Prescribed Dose: 30 Gy
Reference Point Dosage Given to Date: 67 Gy
Reference Point Session Dosage Given: 2 Gy
Session Number: 36

## 2021-08-29 ENCOUNTER — Ambulatory Visit: Payer: Medicare Other

## 2021-08-29 ENCOUNTER — Other Ambulatory Visit: Payer: Self-pay

## 2021-08-29 ENCOUNTER — Ambulatory Visit
Admission: RE | Admit: 2021-08-29 | Discharge: 2021-08-29 | Disposition: A | Discharge status: Home / Self Care | Payer: Medicare Other | Source: Ambulatory Visit | Attending: Radiation Oncology | Admitting: Radiation Oncology

## 2021-08-29 ENCOUNTER — Ambulatory Visit (HOSPITAL_COMMUNITY)
Admission: RE | Admit: 2021-08-29 | Discharge: 2021-08-29 | Disposition: A | Discharge status: Home / Self Care | Payer: Medicare Other | Source: Ambulatory Visit | Attending: Nephrology | Admitting: Nephrology

## 2021-08-29 DIAGNOSIS — C61 Malignant neoplasm of prostate: Secondary | ICD-10-CM | POA: Diagnosis not present

## 2021-08-29 DIAGNOSIS — Z510 Encounter for antineoplastic radiation therapy: Secondary | ICD-10-CM | POA: Diagnosis not present

## 2021-08-29 DIAGNOSIS — D649 Anemia, unspecified: Secondary | ICD-10-CM | POA: Diagnosis not present

## 2021-08-29 DIAGNOSIS — Z191 Hormone sensitive malignancy status: Secondary | ICD-10-CM | POA: Diagnosis not present

## 2021-08-29 LAB — RAD ONC ARIA SESSION SUMMARY
Course Elapsed Days: 57
Plan Fractions Treated to Date: 12
Plan Prescribed Dose Per Fraction: 2 Gy
Plan Total Fractions Prescribed: 15
Plan Total Prescribed Dose: 30 Gy
Reference Point Dosage Given to Date: 69 Gy
Reference Point Session Dosage Given: 2 Gy
Session Number: 37

## 2021-08-29 LAB — PREPARE RBC (CROSSMATCH)

## 2021-08-29 MED ORDER — SODIUM CHLORIDE 0.9% IV SOLUTION: Freq: Once | INTRAVENOUS | Status: DC

## 2021-08-30 ENCOUNTER — Ambulatory Visit
Admission: RE | Admit: 2021-08-30 | Discharge: 2021-08-30 | Disposition: A | Discharge status: Home / Self Care | Payer: Medicare Other | Source: Ambulatory Visit | Attending: Radiation Oncology | Admitting: Radiation Oncology

## 2021-08-30 ENCOUNTER — Other Ambulatory Visit: Payer: Self-pay

## 2021-08-30 ENCOUNTER — Ambulatory Visit: Payer: Medicare Other

## 2021-08-30 DIAGNOSIS — D509 Iron deficiency anemia, unspecified: Secondary | ICD-10-CM | POA: Diagnosis not present

## 2021-08-30 DIAGNOSIS — N186 End stage renal disease: Secondary | ICD-10-CM | POA: Diagnosis not present

## 2021-08-30 DIAGNOSIS — D631 Anemia in chronic kidney disease: Secondary | ICD-10-CM | POA: Diagnosis not present

## 2021-08-30 DIAGNOSIS — Z191 Hormone sensitive malignancy status: Secondary | ICD-10-CM | POA: Diagnosis not present

## 2021-08-30 DIAGNOSIS — Z510 Encounter for antineoplastic radiation therapy: Secondary | ICD-10-CM | POA: Diagnosis not present

## 2021-08-30 DIAGNOSIS — N2581 Secondary hyperparathyroidism of renal origin: Secondary | ICD-10-CM | POA: Diagnosis not present

## 2021-08-30 DIAGNOSIS — Z992 Dependence on renal dialysis: Secondary | ICD-10-CM | POA: Diagnosis not present

## 2021-08-30 DIAGNOSIS — C61 Malignant neoplasm of prostate: Secondary | ICD-10-CM | POA: Diagnosis not present

## 2021-08-30 DIAGNOSIS — J158 Pneumonia due to other specified bacteria: Secondary | ICD-10-CM | POA: Diagnosis not present

## 2021-08-30 LAB — RAD ONC ARIA SESSION SUMMARY
Course Elapsed Days: 58
Plan Fractions Treated to Date: 13
Plan Prescribed Dose Per Fraction: 2 Gy
Plan Total Fractions Prescribed: 15
Plan Total Prescribed Dose: 30 Gy
Reference Point Dosage Given to Date: 71 Gy
Reference Point Session Dosage Given: 2 Gy
Session Number: 38

## 2021-08-30 LAB — TYPE AND SCREEN
ABO/RH(D): O POS
Antibody Screen: NEGATIVE
Unit division: 0

## 2021-08-30 LAB — BPAM RBC
Blood Product Expiration Date: 202308272359
ISSUE DATE / TIME: 202307270926
Unit Type and Rh: 5100

## 2021-09-02 ENCOUNTER — Ambulatory Visit: Payer: Medicare Other

## 2021-09-02 DIAGNOSIS — D631 Anemia in chronic kidney disease: Secondary | ICD-10-CM | POA: Diagnosis not present

## 2021-09-02 DIAGNOSIS — Z992 Dependence on renal dialysis: Secondary | ICD-10-CM | POA: Diagnosis not present

## 2021-09-02 DIAGNOSIS — N186 End stage renal disease: Secondary | ICD-10-CM | POA: Diagnosis not present

## 2021-09-02 DIAGNOSIS — D509 Iron deficiency anemia, unspecified: Secondary | ICD-10-CM | POA: Diagnosis not present

## 2021-09-02 DIAGNOSIS — J158 Pneumonia due to other specified bacteria: Secondary | ICD-10-CM | POA: Diagnosis not present

## 2021-09-02 DIAGNOSIS — N2581 Secondary hyperparathyroidism of renal origin: Secondary | ICD-10-CM | POA: Diagnosis not present

## 2021-09-03 ENCOUNTER — Other Ambulatory Visit: Payer: Self-pay

## 2021-09-03 ENCOUNTER — Ambulatory Visit: Payer: Medicare Other

## 2021-09-03 ENCOUNTER — Ambulatory Visit
Admission: RE | Admit: 2021-09-03 | Discharge: 2021-09-03 | Disposition: A | Discharge status: Home / Self Care | Payer: Medicare Other | Source: Ambulatory Visit | Attending: Radiation Oncology | Admitting: Radiation Oncology

## 2021-09-03 DIAGNOSIS — Z510 Encounter for antineoplastic radiation therapy: Secondary | ICD-10-CM | POA: Diagnosis not present

## 2021-09-03 DIAGNOSIS — C61 Malignant neoplasm of prostate: Secondary | ICD-10-CM | POA: Diagnosis not present

## 2021-09-03 DIAGNOSIS — N2581 Secondary hyperparathyroidism of renal origin: Secondary | ICD-10-CM | POA: Diagnosis not present

## 2021-09-03 DIAGNOSIS — Z992 Dependence on renal dialysis: Secondary | ICD-10-CM | POA: Diagnosis not present

## 2021-09-03 DIAGNOSIS — J158 Pneumonia due to other specified bacteria: Secondary | ICD-10-CM | POA: Diagnosis not present

## 2021-09-03 DIAGNOSIS — N186 End stage renal disease: Secondary | ICD-10-CM | POA: Diagnosis not present

## 2021-09-03 DIAGNOSIS — Z191 Hormone sensitive malignancy status: Secondary | ICD-10-CM | POA: Diagnosis not present

## 2021-09-03 DIAGNOSIS — D631 Anemia in chronic kidney disease: Secondary | ICD-10-CM | POA: Diagnosis not present

## 2021-09-03 DIAGNOSIS — I12 Hypertensive chronic kidney disease with 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: Secondary | ICD-10-CM | POA: Diagnosis not present

## 2021-09-03 DIAGNOSIS — E1151 Type 2 diabetes mellitus with diabetic peripheral angiopathy without gangrene: Secondary | ICD-10-CM | POA: Diagnosis not present

## 2021-09-03 LAB — RAD ONC ARIA SESSION SUMMARY
Course Elapsed Days: 62
Plan Fractions Treated to Date: 14
Plan Prescribed Dose Per Fraction: 2 Gy
Plan Total Fractions Prescribed: 15
Plan Total Prescribed Dose: 30 Gy
Reference Point Dosage Given to Date: 73 Gy
Reference Point Session Dosage Given: 2 Gy
Session Number: 39

## 2021-09-04 ENCOUNTER — Other Ambulatory Visit: Payer: Self-pay

## 2021-09-04 ENCOUNTER — Ambulatory Visit
Admission: RE | Admit: 2021-09-04 | Discharge: 2021-09-04 | Disposition: A | Discharge status: Home / Self Care | Payer: Medicare Other | Source: Ambulatory Visit | Attending: Radiation Oncology | Admitting: Radiation Oncology

## 2021-09-04 ENCOUNTER — Encounter: Payer: Self-pay | Admitting: Urology

## 2021-09-04 DIAGNOSIS — Z992 Dependence on renal dialysis: Secondary | ICD-10-CM | POA: Diagnosis not present

## 2021-09-04 DIAGNOSIS — N2581 Secondary hyperparathyroidism of renal origin: Secondary | ICD-10-CM | POA: Diagnosis not present

## 2021-09-04 DIAGNOSIS — Z191 Hormone sensitive malignancy status: Secondary | ICD-10-CM | POA: Diagnosis not present

## 2021-09-04 DIAGNOSIS — N186 End stage renal disease: Secondary | ICD-10-CM | POA: Diagnosis not present

## 2021-09-04 DIAGNOSIS — E1151 Type 2 diabetes mellitus with diabetic peripheral angiopathy without gangrene: Secondary | ICD-10-CM | POA: Diagnosis not present

## 2021-09-04 DIAGNOSIS — D631 Anemia in chronic kidney disease: Secondary | ICD-10-CM | POA: Diagnosis not present

## 2021-09-04 DIAGNOSIS — C61 Malignant neoplasm of prostate: Secondary | ICD-10-CM | POA: Diagnosis not present

## 2021-09-04 DIAGNOSIS — J158 Pneumonia due to other specified bacteria: Secondary | ICD-10-CM | POA: Diagnosis not present

## 2021-09-04 DIAGNOSIS — R972 Elevated prostate specific antigen [PSA]: Secondary | ICD-10-CM | POA: Diagnosis not present

## 2021-09-04 DIAGNOSIS — Z510 Encounter for antineoplastic radiation therapy: Secondary | ICD-10-CM | POA: Diagnosis not present

## 2021-09-04 LAB — RAD ONC ARIA SESSION SUMMARY
Course Elapsed Days: 63
Plan Fractions Treated to Date: 15
Plan Prescribed Dose Per Fraction: 2 Gy
Plan Total Fractions Prescribed: 15
Plan Total Prescribed Dose: 30 Gy
Reference Point Dosage Given to Date: 75 Gy
Reference Point Session Dosage Given: 2 Gy
Session Number: 40

## 2021-09-09 ENCOUNTER — Telehealth: Payer: Medicare Other

## 2021-09-09 DIAGNOSIS — E1151 Type 2 diabetes mellitus with diabetic peripheral angiopathy without gangrene: Secondary | ICD-10-CM | POA: Diagnosis not present

## 2021-09-09 DIAGNOSIS — N2581 Secondary hyperparathyroidism of renal origin: Secondary | ICD-10-CM | POA: Diagnosis not present

## 2021-09-09 DIAGNOSIS — D631 Anemia in chronic kidney disease: Secondary | ICD-10-CM | POA: Diagnosis not present

## 2021-09-09 DIAGNOSIS — J158 Pneumonia due to other specified bacteria: Secondary | ICD-10-CM | POA: Diagnosis not present

## 2021-09-09 DIAGNOSIS — N186 End stage renal disease: Secondary | ICD-10-CM | POA: Diagnosis not present

## 2021-09-09 DIAGNOSIS — Z992 Dependence on renal dialysis: Secondary | ICD-10-CM | POA: Diagnosis not present

## 2021-09-10 ENCOUNTER — Ambulatory Visit: Payer: Medicare Other

## 2021-09-10 NOTE — Patient Instructions (Signed)
Visit Information  Mr. Salameh  it was nice speaking with you. Please call me directly [phone] if you have questions about the goals we discussed.    Patient Goals/Self Care Activities: -Patient/Caregiver will self-administer medications as prescribed as evidenced by self-report/primary caregiver report  -Patient/Caregiver will attend all scheduled provider appointments as evidenced by clinician review of documented attendance to scheduled appointments and patient/caregiver report -Patient/Caregiver will call pharmacy for medication refills as evidenced by patient report and review of pharmacy fill history as appropriate -Patient/Caregiver will call provider office for new concerns or questions as evidenced by review of documented incoming telephone call notes and patient report -Patient/Caregiver verbalizes understanding of plan -Patient/Caregiver will focus on medication adherence by taking medications as prescribed  -Calls provider office for new concerns, questions, or BP outside discussed parameters -Checks BP and records as discussed -Follows a low sodium diet/DASH diet    Patient verbalizes understanding of instructions and care plan provided today and agrees to view in [place]. Active MyChart status and patient understanding of how to access instructions and care plan via MyChart confirmed with patient.     Follow up Plan: Patient would like continued follow-up.  CM RNCM will outreach the patient within the next 4 weeks.  Patient will call office if needed prior to next encounter  Lazaro Arms, RN  [phone]

## 2021-09-10 NOTE — Chronic Care Management (AMB) (Signed)
RNCM Care Management Note 09/10/2021 Name: Briar Witherspoon MRN: [id number] DOB: Nov 27, 1965   James Wall is a 56 y.o. year old male who sees Wells Guiles, DO for primary care. RNCM was consulted  to assistance patient with  Care Coordination.      Engaged with patient by telephone for follow up visit in response to provider referral for Care Coordination.     Summary: His blood pressure has been fluctuating between 180/99 and occasionally 180/69 or 70.  He has an upcoming appointment with his cardiologist to discuss medication adjustments.. See Care Plan below for interventions and patient self-care actives.  Recommendation: The patient may benefit from taking medications as prescribed, checking blood pressures and record values, calling your physician if numbers are abnormal, and The patient agrees.  Follow up Plan: Patient would like continued follow-up.  CM RNCM will outreach the patient within the next 4 weeks.  Patient will call office if needed prior to next encounter   SDOH (Social Determinants of Health) screening and interventions performed today: No     RN Plan of Care for Conditions/Un Met Needs Addressed and Monitored  Patient Care Plan: RN Case Manager     Problem Identified: General Plan of Care in a patiednt with Hypertention      Goal: The patient will learn to monitor and manage his blood pressure through lifestyle modigifcations to prevet further complications   Start Date: 06/04/2021  Expected End Date: 10/03/2022  Priority: High  Note:   Current Barriers:  Chronic Disease Management support and education needs related to HTN  RNCM Clinical Goal(s):  Patient will verbalize basic understanding of HTN disease process and self health management plan as evidenced by keeping his blood pressure within acceptable levels  through collaboration with RN Care manager, provider, and care team.   Interventions: 1:1 collaboration with primary care provider  regarding development and update of comprehensive plan of care as evidenced by provider attestation and co-signature Inter-disciplinary care team collaboration (see longitudinal plan of care) Evaluation of current treatment plan related to  self management and patient's adherence to plan as established by provider   Hypertension: (Status: New goal.) Long Term Goal  Last practice recorded BP readings:  BP Readings from Last 3 Encounters:  08/29/21 (!) 157/90  07/18/21 (!) 161/77  07/01/21 (!) 163/91  Most recent eGFR/CrCl: No results found for: "EGFR"  No components found for: "CRCL"  Evaluation of current treatment plan related to hypertension self management and patient's adherence to plan as established by provider;   Discussed plans with patient for ongoing care management follow up and provided patient with direct contact information for care management team; Advised patient, providing education and rationale, to monitor blood pressure daily and record, calling PCP for findings outside established parameters;  09/10/21:  It seems Mr. Kirker has been doing alright.  He recently completed his 15 th and final radiation treatment.  However, he is still undergoing dialysis and experiencing feelings of weakness and fatigue.  He is adhering to all of his prescribed medications, but unfortunately require two blood transfusions.  The source of his blood loss has yet to be determined, and his blood pressure has been fluctuating between 180/99 and occasionally 180/69 or 70.  He has an upcoming appointment with his cardiologist to discuss medication adjustments.    Patient Goals/Self Care Activities: -Patient/Caregiver will self-administer medications as prescribed as evidenced by self-report/primary caregiver report  -Patient/Caregiver will attend all scheduled provider appointments as evidenced by clinician  review of documented attendance to scheduled appointments and patient/caregiver  report -Patient/Caregiver will call pharmacy for medication refills as evidenced by patient report and review of pharmacy fill history as appropriate -Patient/Caregiver will call provider office for new concerns or questions as evidenced by review of documented incoming telephone call notes and patient report -Patient/Caregiver verbalizes understanding of plan -Patient/Caregiver will focus on medication adherence by taking medications as prescribed  -Calls provider office for new concerns, questions, or BP outside discussed parameters -Checks BP and records as discussed -Follows a low sodium diet/DASH diet    Resolving due to duplicate goal          Lazaro Arms RN, BSN, [place] Management Coordinator [facility]   Phone: [phone]

## 2021-09-11 DIAGNOSIS — N186 End stage renal disease: Secondary | ICD-10-CM | POA: Diagnosis not present

## 2021-09-11 DIAGNOSIS — J158 Pneumonia due to other specified bacteria: Secondary | ICD-10-CM | POA: Diagnosis not present

## 2021-09-11 DIAGNOSIS — N2581 Secondary hyperparathyroidism of renal origin: Secondary | ICD-10-CM | POA: Diagnosis not present

## 2021-09-11 DIAGNOSIS — Z992 Dependence on renal dialysis: Secondary | ICD-10-CM | POA: Diagnosis not present

## 2021-09-11 DIAGNOSIS — D631 Anemia in chronic kidney disease: Secondary | ICD-10-CM | POA: Diagnosis not present

## 2021-09-11 DIAGNOSIS — E1151 Type 2 diabetes mellitus with diabetic peripheral angiopathy without gangrene: Secondary | ICD-10-CM | POA: Diagnosis not present

## 2021-09-13 DIAGNOSIS — Z992 Dependence on renal dialysis: Secondary | ICD-10-CM | POA: Diagnosis not present

## 2021-09-13 DIAGNOSIS — D631 Anemia in chronic kidney disease: Secondary | ICD-10-CM | POA: Diagnosis not present

## 2021-09-13 DIAGNOSIS — N2581 Secondary hyperparathyroidism of renal origin: Secondary | ICD-10-CM | POA: Diagnosis not present

## 2021-09-13 DIAGNOSIS — J158 Pneumonia due to other specified bacteria: Secondary | ICD-10-CM | POA: Diagnosis not present

## 2021-09-13 DIAGNOSIS — N186 End stage renal disease: Secondary | ICD-10-CM | POA: Diagnosis not present

## 2021-09-13 DIAGNOSIS — E1151 Type 2 diabetes mellitus with diabetic peripheral angiopathy without gangrene: Secondary | ICD-10-CM | POA: Diagnosis not present

## 2021-09-14 ENCOUNTER — Encounter (HOSPITAL_COMMUNITY): Payer: Self-pay | Admitting: *Deleted

## 2021-09-14 ENCOUNTER — Inpatient Hospital Stay (HOSPITAL_COMMUNITY)
Admission: EM | Admit: 2021-09-14 | Discharge: 2021-09-16 | DRG: 064 | Disposition: A | Discharge status: Rehabilitation Facility | Payer: Medicare Other | Attending: Family Medicine | Admitting: Family Medicine

## 2021-09-14 ENCOUNTER — Emergency Department (HOSPITAL_COMMUNITY): Payer: Medicare Other

## 2021-09-14 ENCOUNTER — Observation Stay (HOSPITAL_COMMUNITY): Payer: Medicare Other

## 2021-09-14 ENCOUNTER — Other Ambulatory Visit: Payer: Self-pay

## 2021-09-14 DIAGNOSIS — I10 Essential (primary) hypertension: Secondary | ICD-10-CM | POA: Diagnosis present

## 2021-09-14 DIAGNOSIS — Z452 Encounter for adjustment and management of vascular access device: Secondary | ICD-10-CM | POA: Diagnosis not present

## 2021-09-14 DIAGNOSIS — Z8546 Personal history of malignant neoplasm of prostate: Secondary | ICD-10-CM | POA: Diagnosis not present

## 2021-09-14 DIAGNOSIS — F17210 Nicotine dependence, cigarettes, uncomplicated: Secondary | ICD-10-CM | POA: Diagnosis present

## 2021-09-14 DIAGNOSIS — M898X9 Other specified disorders of bone, unspecified site: Secondary | ICD-10-CM | POA: Diagnosis present

## 2021-09-14 DIAGNOSIS — Z825 Family history of asthma and other chronic lower respiratory diseases: Secondary | ICD-10-CM | POA: Diagnosis not present

## 2021-09-14 DIAGNOSIS — Z8614 Personal history of Methicillin resistant Staphylococcus aureus infection: Secondary | ICD-10-CM

## 2021-09-14 DIAGNOSIS — F32A Depression, unspecified: Secondary | ICD-10-CM | POA: Diagnosis present

## 2021-09-14 DIAGNOSIS — Z716 Tobacco abuse counseling: Secondary | ICD-10-CM

## 2021-09-14 DIAGNOSIS — I132 Hypertensive heart and chronic kidney disease with heart failure and with stage 5 chronic kidney disease, or end stage renal disease: Secondary | ICD-10-CM | POA: Diagnosis not present

## 2021-09-14 DIAGNOSIS — I1311 Hypertensive heart and chronic kidney disease without heart failure, with stage 5 chronic kidney disease, or end stage renal disease: Secondary | ICD-10-CM | POA: Diagnosis present

## 2021-09-14 DIAGNOSIS — Z79899 Other long term (current) drug therapy: Secondary | ICD-10-CM | POA: Diagnosis not present

## 2021-09-14 DIAGNOSIS — Z7902 Long term (current) use of antithrombotics/antiplatelets: Secondary | ICD-10-CM | POA: Diagnosis not present

## 2021-09-14 DIAGNOSIS — I440 Atrioventricular block, first degree: Secondary | ICD-10-CM | POA: Diagnosis present

## 2021-09-14 DIAGNOSIS — M47812 Spondylosis without myelopathy or radiculopathy, cervical region: Secondary | ICD-10-CM | POA: Diagnosis not present

## 2021-09-14 DIAGNOSIS — Q283 Other malformations of cerebral vessels: Secondary | ICD-10-CM | POA: Diagnosis not present

## 2021-09-14 DIAGNOSIS — Z823 Family history of stroke: Secondary | ICD-10-CM | POA: Diagnosis not present

## 2021-09-14 DIAGNOSIS — C61 Malignant neoplasm of prostate: Secondary | ICD-10-CM | POA: Diagnosis present

## 2021-09-14 DIAGNOSIS — Z8249 Family history of ischemic heart disease and other diseases of the circulatory system: Secondary | ICD-10-CM

## 2021-09-14 DIAGNOSIS — I63233 Cerebral infarction due to unspecified occlusion or stenosis of bilateral carotid arteries: Secondary | ICD-10-CM | POA: Diagnosis not present

## 2021-09-14 DIAGNOSIS — N186 End stage renal disease: Secondary | ICD-10-CM

## 2021-09-14 DIAGNOSIS — N189 Chronic kidney disease, unspecified: Secondary | ICD-10-CM | POA: Diagnosis not present

## 2021-09-14 DIAGNOSIS — I272 Pulmonary hypertension, unspecified: Secondary | ICD-10-CM | POA: Diagnosis present

## 2021-09-14 DIAGNOSIS — R188 Other ascites: Secondary | ICD-10-CM | POA: Diagnosis present

## 2021-09-14 DIAGNOSIS — Z992 Dependence on renal dialysis: Secondary | ICD-10-CM

## 2021-09-14 DIAGNOSIS — I63511 Cerebral infarction due to unspecified occlusion or stenosis of right middle cerebral artery: Principal | ICD-10-CM | POA: Diagnosis present

## 2021-09-14 DIAGNOSIS — Z923 Personal history of irradiation: Secondary | ICD-10-CM

## 2021-09-14 DIAGNOSIS — D649 Anemia, unspecified: Secondary | ICD-10-CM | POA: Diagnosis not present

## 2021-09-14 DIAGNOSIS — D630 Anemia in neoplastic disease: Secondary | ICD-10-CM | POA: Diagnosis present

## 2021-09-14 DIAGNOSIS — R531 Weakness: Secondary | ICD-10-CM | POA: Diagnosis not present

## 2021-09-14 DIAGNOSIS — K219 Gastro-esophageal reflux disease without esophagitis: Secondary | ICD-10-CM | POA: Diagnosis not present

## 2021-09-14 DIAGNOSIS — E669 Obesity, unspecified: Secondary | ICD-10-CM | POA: Diagnosis present

## 2021-09-14 DIAGNOSIS — Z888 Allergy status to other drugs, medicaments and biological substances status: Secondary | ICD-10-CM

## 2021-09-14 DIAGNOSIS — N250 Renal osteodystrophy: Secondary | ICD-10-CM | POA: Diagnosis not present

## 2021-09-14 DIAGNOSIS — I672 Cerebral atherosclerosis: Secondary | ICD-10-CM | POA: Diagnosis not present

## 2021-09-14 DIAGNOSIS — T8242XA Displacement of vascular dialysis catheter, initial encounter: Secondary | ICD-10-CM | POA: Diagnosis not present

## 2021-09-14 DIAGNOSIS — D631 Anemia in chronic kidney disease: Secondary | ICD-10-CM | POA: Diagnosis not present

## 2021-09-14 DIAGNOSIS — R29705 NIHSS score 5: Secondary | ICD-10-CM | POA: Diagnosis present

## 2021-09-14 DIAGNOSIS — D696 Thrombocytopenia, unspecified: Secondary | ICD-10-CM | POA: Diagnosis present

## 2021-09-14 DIAGNOSIS — R001 Bradycardia, unspecified: Secondary | ICD-10-CM | POA: Diagnosis present

## 2021-09-14 DIAGNOSIS — G8194 Hemiplegia, unspecified affecting left nondominant side: Secondary | ICD-10-CM | POA: Diagnosis present

## 2021-09-14 DIAGNOSIS — R471 Dysarthria and anarthria: Secondary | ICD-10-CM | POA: Diagnosis present

## 2021-09-14 DIAGNOSIS — G4733 Obstructive sleep apnea (adult) (pediatric): Secondary | ICD-10-CM | POA: Diagnosis present

## 2021-09-14 DIAGNOSIS — J449 Chronic obstructive pulmonary disease, unspecified: Secondary | ICD-10-CM | POA: Diagnosis present

## 2021-09-14 DIAGNOSIS — I639 Cerebral infarction, unspecified: Secondary | ICD-10-CM | POA: Diagnosis not present

## 2021-09-14 DIAGNOSIS — N185 Chronic kidney disease, stage 5: Secondary | ICD-10-CM | POA: Diagnosis not present

## 2021-09-14 DIAGNOSIS — N2581 Secondary hyperparathyroidism of renal origin: Secondary | ICD-10-CM | POA: Diagnosis present

## 2021-09-14 DIAGNOSIS — I69354 Hemiplegia and hemiparesis following cerebral infarction affecting left non-dominant side: Secondary | ICD-10-CM | POA: Diagnosis present

## 2021-09-14 DIAGNOSIS — I12 Hypertensive chronic kidney disease with 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: Secondary | ICD-10-CM

## 2021-09-14 DIAGNOSIS — Z886 Allergy status to analgesic agent status: Secondary | ICD-10-CM

## 2021-09-14 DIAGNOSIS — I428 Other cardiomyopathies: Secondary | ICD-10-CM | POA: Diagnosis present

## 2021-09-14 DIAGNOSIS — E785 Hyperlipidemia, unspecified: Secondary | ICD-10-CM | POA: Diagnosis present

## 2021-09-14 DIAGNOSIS — I63131 Cerebral infarction due to embolism of right carotid artery: Secondary | ICD-10-CM | POA: Diagnosis not present

## 2021-09-14 DIAGNOSIS — F419 Anxiety disorder, unspecified: Secondary | ICD-10-CM | POA: Diagnosis present

## 2021-09-14 DIAGNOSIS — G319 Degenerative disease of nervous system, unspecified: Secondary | ICD-10-CM | POA: Diagnosis not present

## 2021-09-14 DIAGNOSIS — E1122 Type 2 diabetes mellitus with diabetic chronic kidney disease: Secondary | ICD-10-CM | POA: Diagnosis not present

## 2021-09-14 DIAGNOSIS — K5904 Chronic idiopathic constipation: Secondary | ICD-10-CM | POA: Diagnosis not present

## 2021-09-14 DIAGNOSIS — Z7951 Long term (current) use of inhaled steroids: Secondary | ICD-10-CM

## 2021-09-14 DIAGNOSIS — Z6831 Body mass index (BMI) 31.0-31.9, adult: Secondary | ICD-10-CM

## 2021-09-14 DIAGNOSIS — I6521 Occlusion and stenosis of right carotid artery: Secondary | ICD-10-CM | POA: Diagnosis present

## 2021-09-14 DIAGNOSIS — I69322 Dysarthria following cerebral infarction: Secondary | ICD-10-CM | POA: Diagnosis not present

## 2021-09-14 DIAGNOSIS — Y832 Surgical operation with anastomosis, bypass or graft as the cause of abnormal reaction of the patient, or of later complication, without mention of misadventure at the time of the procedure: Secondary | ICD-10-CM | POA: Diagnosis not present

## 2021-09-14 DIAGNOSIS — Z86010 Personal history of colonic polyps: Secondary | ICD-10-CM

## 2021-09-14 DIAGNOSIS — K7460 Unspecified cirrhosis of liver: Secondary | ICD-10-CM | POA: Diagnosis present

## 2021-09-14 DIAGNOSIS — Z8673 Personal history of transient ischemic attack (TIA), and cerebral infarction without residual deficits: Secondary | ICD-10-CM

## 2021-09-14 DIAGNOSIS — I509 Heart failure, unspecified: Secondary | ICD-10-CM | POA: Diagnosis not present

## 2021-09-14 DIAGNOSIS — I2510 Atherosclerotic heart disease of native coronary artery without angina pectoris: Secondary | ICD-10-CM | POA: Diagnosis present

## 2021-09-14 DIAGNOSIS — I6503 Occlusion and stenosis of bilateral vertebral arteries: Secondary | ICD-10-CM | POA: Diagnosis not present

## 2021-09-14 LAB — DIFFERENTIAL
Abs Immature Granulocytes: 0.01 10*3/uL (ref 0.00–0.07)
Basophils Absolute: 0 10*3/uL (ref 0.0–0.1)
Basophils Relative: 0 %
Eosinophils Absolute: 0.2 10*3/uL (ref 0.0–0.5)
Eosinophils Relative: 6 %
Immature Granulocytes: 0 %
Lymphocytes Relative: 16 %
Lymphs Abs: 0.5 10*3/uL — ABNORMAL LOW (ref 0.7–4.0)
Monocytes Absolute: 0.3 10*3/uL (ref 0.1–1.0)
Monocytes Relative: 11 %
Neutro Abs: 2 10*3/uL (ref 1.7–7.7)
Neutrophils Relative %: 67 %

## 2021-09-14 LAB — PROTIME-INR
INR: 1.1 (ref 0.8–1.2)
Prothrombin Time: 14 seconds (ref 11.4–15.2)

## 2021-09-14 LAB — COMPREHENSIVE METABOLIC PANEL
ALT: 12 U/L (ref 0–44)
AST: 15 U/L (ref 15–41)
Albumin: 3.3 g/dL — ABNORMAL LOW (ref 3.5–5.0)
Alkaline Phosphatase: 52 U/L (ref 38–126)
Anion gap: 8 (ref 5–15)
BUN: 23 mg/dL — ABNORMAL HIGH (ref 6–20)
CO2: 29 mmol/L (ref 22–32)
Calcium: 10.1 mg/dL (ref 8.9–10.3)
Chloride: 102 mmol/L (ref 98–111)
Creatinine, Ser: 7.52 mg/dL — ABNORMAL HIGH (ref 0.61–1.24)
GFR, Estimated: 8 mL/min — ABNORMAL LOW (ref >60)
Glucose, Bld: 95 mg/dL (ref 70–99)
Potassium: 3.6 mmol/L (ref 3.5–5.1)
Sodium: 139 mmol/L (ref 135–145)
Total Bilirubin: 0.4 mg/dL (ref 0.3–1.2)
Total Protein: 6.1 g/dL — ABNORMAL LOW (ref 6.5–8.1)

## 2021-09-14 LAB — CBC
HCT: 20 % — ABNORMAL LOW (ref 39.0–52.0)
Hemoglobin: 6.8 g/dL — CL (ref 13.0–17.0)
MCH: 34.9 pg — ABNORMAL HIGH (ref 26.0–34.0)
MCHC: 34 g/dL (ref 30.0–36.0)
MCV: 102.6 fL — ABNORMAL HIGH (ref 80.0–100.0)
Platelets: 137 10*3/uL — ABNORMAL LOW (ref 150–400)
RBC: 1.95 MIL/uL — ABNORMAL LOW (ref 4.22–5.81)
RDW: 14.6 % (ref 11.5–15.5)
WBC: 3 10*3/uL — ABNORMAL LOW (ref 4.0–10.5)
nRBC: 0 % (ref 0.0–0.2)

## 2021-09-14 LAB — ETHANOL: Alcohol, Ethyl (B): <10 mg/dL (ref <10)

## 2021-09-14 LAB — LIPID PANEL
Cholesterol: 126 mg/dL (ref 0–200)
HDL: 59 mg/dL (ref >40)
LDL Cholesterol: 55 mg/dL (ref 0–99)
Total CHOL/HDL Ratio: 2.1 RATIO
Triglycerides: 62 mg/dL (ref <150)
VLDL: 12 mg/dL (ref 0–40)

## 2021-09-14 LAB — HEMOGLOBIN A1C
Hgb A1c MFr Bld: 4.1 % — ABNORMAL LOW (ref 4.8–5.6)
Mean Plasma Glucose: 70.97 mg/dL

## 2021-09-14 LAB — MAGNESIUM: Magnesium: 2.1 mg/dL (ref 1.7–2.4)

## 2021-09-14 LAB — TSH: TSH: 3.91 u[IU]/mL (ref 0.350–4.500)

## 2021-09-14 LAB — HIV ANTIBODY (ROUTINE TESTING W REFLEX): HIV Screen 4th Generation wRfx: NONREACTIVE

## 2021-09-14 LAB — I-STAT BETA HCG BLOOD, ED (MC, WL, AP ONLY): I-stat hCG, quantitative: <5 m[IU]/mL (ref <5)

## 2021-09-14 LAB — APTT: aPTT: 33 seconds (ref 24–36)

## 2021-09-14 MED ORDER — AMLODIPINE BESYLATE 10 MG PO TABS
10.0000 mg | ORAL_TABLET | Freq: Every day | ORAL | Status: DC
  Administered 2021-09-15: 10 mg via ORAL
  Filled 2021-09-14: qty 1

## 2021-09-14 MED ORDER — HYDRALAZINE HCL 25 MG PO TABS
25.0000 mg | ORAL_TABLET | Freq: Two times a day (BID) | ORAL | Status: DC
  Administered 2021-09-14 – 2021-09-15 (×3): 25 mg via ORAL
  Filled 2021-09-14 (×3): qty 1

## 2021-09-14 MED ORDER — SODIUM CHLORIDE 0.9% FLUSH: 3.0000 mL | Freq: Once | INTRAVENOUS | Status: DC

## 2021-09-14 MED ORDER — IOHEXOL 350 MG/ML SOLN
100.0000 mL | Freq: Once | INTRAVENOUS | Status: AC | PRN
  Administered 2021-09-14: 100 mL via INTRAVENOUS

## 2021-09-14 MED ORDER — CLONIDINE HCL 0.3 MG/24HR TD PTWK
0.3000 mg | MEDICATED_PATCH | TRANSDERMAL | Status: DC
  Administered 2021-09-15: 0.3 mg via TRANSDERMAL
  Filled 2021-09-14: qty 1

## 2021-09-14 MED ORDER — TAMSULOSIN HCL 0.4 MG PO CAPS
0.4000 mg | ORAL_CAPSULE | Freq: Every day | ORAL | Status: DC
  Administered 2021-09-15: 0.4 mg via ORAL
  Filled 2021-09-14: qty 1

## 2021-09-14 MED ORDER — METOPROLOL TARTRATE 25 MG PO TABS: 100.0000 mg | ORAL_TABLET | ORAL | Status: DC

## 2021-09-14 MED ORDER — SEVELAMER CARBONATE 800 MG PO TABS
1600.0000 mg | ORAL_TABLET | Freq: Three times a day (TID) | ORAL | Status: DC
  Administered 2021-09-15: 1600 mg via ORAL
  Filled 2021-09-14: qty 2

## 2021-09-14 NOTE — ED Notes (Signed)
Patient transported to MRI 

## 2021-09-14 NOTE — Consult Note (Signed)
Neurology Consultation  Reason for Consult: stroke Referring Physician: Dr Ashok Cordia, EDP  CC: Left-sided weakness  History is obtained from: Patient, chart  HPI: James Wall is a 56 y.o. male past medical history of ESRD on dialysis Monday Wednesday Friday, prostate cancer, nonalcoholic cirrhosis, hypertension, hyperlipidemia, hypertensive heart disease, recent anemia requiring blood transfusion with chronic anemia in his history, presenting for evaluation of left-sided weakness that started suddenly on Wednesday.  He reports being in his usual state of health prior to Wednesday, when he started noticing difficulty using his left arm.  This worsened to the point where he was having a hard time holding utensils with that arm.  When asked, why did he not present earlier, he said that he has a lot going on in terms of his health and did not know what to make of this weakness and that is why could not come to the hospital before.  He was evaluated in the emergency room and an MRI was done that showed a right hemispheric infarct. Usually walks without support but since Wednesday has been having trouble walking as well.  No headaches.  Had off-and-on shortness of breath for the past few days.  No chest pain.  No visual symptoms.  Last emesis was Friday yesterday.  LKW: Wednesday, 09/11/2021 IV thrombolysis given?: no, at side the window Premorbid modified Rankin scale (mRS): 0   ROS: Full ROS was performed and is negative except as noted in the HPI.   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)    primary cancer is prostate   Anemia associated with chronic renal failure    Anxiety    Arthritis    Cirrhosis, nonalcoholic ([place])    per pt possible from a medication , unsure ;   last ultrasound 08-09-2020 in epic no fibrosis   COPD (chronic obstructive pulmonary disease) ([place])    Depression    ESRD on hemodialysis ([place]) 2009    Started HD Jan 2009;  ESRD secondary to hypertensive nephrosclerosis;  dialysis at [facility] at [facility] on MWF   First degree heart block    GERD (gastroesophageal reflux disease)    Hiatal hernia    History of acute respiratory failure 07/2013   admission;  HCAP w/ ARF with hypoxia   History of adenomatous polyp of colon    History of ascites    s/p paracentesis 01-31-2013 (5L) and last one 03-28-2013 (2.7L)   History of community acquired pneumonia 08/08/2020   admission ; LLL , POA   History of heart murmur in childhood    History of MRSA infection 12/19/2012   hospital admission;  w/ sepsis MRSA bacteramia AVF infection   History of sepsis 03/2017   admission;   HCAP w/ sepsis   Hyperlipidemia    Hypertension    Hypertensive heart disease    cardiologist--- dr Osborne Oman;  nuclear stress test 06-16-2013 intermediate risk w/ mid-distal anterior wall ischemia , not gated;  cardiac cath 07-13-2013 in epic showed normal coronaries and LVSF,  ef not assessed, LCEDP 34mHg   Hypertensive nephrosclerosis, 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 ([place]    Intolerance to cold    due to anemia   Malignant neoplasm prostate ([facility]    urologist--- dr bell/  radiation onologist--- dr mTammi Klippel  dx 01/ 2023,  Gleason 4+3, PSA 348  NICM (nonischemic cardiomyopathy) [place]    followed by  cardiology;   last echo in epic 09-13-2020 ef 55-60%   OSA (obstructive sleep apnea) 2009   no  longer using cpap since the yr started 2009;   sleep study in epic 05-11-2007 severe osa   PSVT (paroxysmal supraventricular tachycardia) ([place])    event monitor 08-01-2019  SR w/ SVT runs , rare PAC/ PVC   Secondary hyperparathyroidism ([place])    Wears glasses      Family History  Problem Relation Age of Onset   Hypertension Mother    Cerebrovascular Accident Father    Hypertension Father    Congestive Heart Failure Brother    Asthma Brother    Stomach cancer Neg Hx     Rectal cancer Neg Hx    Esophageal cancer Neg Hx    Colon cancer Neg Hx      Social History:   reports that he has been smoking cigarettes. He has a 8.50 pack-year smoking history. He has never used smokeless tobacco. He reports current alcohol use. He reports current drug use. Frequency: 4.00 times per week. Drug: Marijuana.  Medications  Current Facility-Administered Medications:    sodium chloride flush (NS) 0.9 % injection 3 mL, 3 mL, Intravenous, Once, Lajean Saver, MD  Current Outpatient Medications:    amLODipine (NORVASC) 10 MG tablet, TAKE 1 TABLET(10 MG) BY MOUTH DAILY (Patient taking differently: Take 10 mg by mouth at bedtime.), Disp: 90 tablet, Rfl: 0   chlorproMAZINE (THORAZINE) 10 MG tablet, Take 1 tablet (10 mg total) by mouth 3 (three) times daily as needed for hiccups., Disp: 9 tablet, Rfl: 0   cloNIDine (CATAPRES - DOSED IN MG/24 HR) 0.3 mg/24hr patch, Place 0.3 mg onto the skin every Sunday., Disp: , Rfl:    hydrALAZINE (APRESOLINE) 25 MG tablet, Take 1 tablet (25 mg total) by mouth 2 (two) times daily. (Patient taking differently: Take 25 mg by mouth 2 (two) times daily.), Disp: 180 tablet, Rfl: 3   oxybutynin (DITROPAN XL) 10 MG 24 hr tablet, Take 1 tablet (10 mg total) by mouth at bedtime. (Patient taking differently: Take 10 mg by mouth daily as needed (frequency).), Disp: 30 tablet, Rfl: 2   tamsulosin (FLOMAX) 0.4 MG CAPS capsule, TAKE 2 CAPSULES(0.8 MG) BY MOUTH DAILY (Patient taking differently: Take 0.4 mg by mouth daily after supper.), Disp: 180 capsule, Rfl: 3   acetaminophen (TYLENOL) 500 MG tablet, Take 500 mg by mouth every 6 (six) hours as needed., Disp: , Rfl:    heparin 1000 unit/mL SOLN injection, Heparin Sodium (Porcine) 1,000 Units/mL Systemic, Disp: , Rfl:    hydrOXYzine (ATARAX/VISTARIL) 25 MG tablet, Take 1 tablet (25 mg total) by mouth every 8 (eight) hours as needed for itching. Further refills need to come from your PCP., Disp: 30 tablet, Rfl:  3   Ipratropium-Albuterol (COMBIVENT) 20-100 MCG/ACT AERS respimat, Inhale 1 puff into the lungs every 6 (six) hours as needed for wheezing., Disp: 4 g, Rfl: 0   isosorbide dinitrate (ISORDIL) 20 MG tablet, TAKE 1 TABLET(20 MG) BY MOUTH TWICE DAILY (Patient taking differently: Take 20 mg by mouth at bedtime. TAKE 1 TABLET(20 MG) BY MOUTH TWICE DAILY), Disp: 180 tablet, Rfl: 3   metoprolol tartrate (LOPRESSOR) 100 MG tablet, Take 1 tablet by mouth twice a day.  ON DIALYSIS DAYS, TAKE AFTER DIALYSIS. (Patient taking differently: as directed. Take 1 tablet by mouth twice a day.  ON DIALYSIS DAYS, TAKE AFTER DIALYSIS.), Disp: 180 tablet, Rfl: 3   omeprazole (PRILOSEC) 40 MG capsule, Take 40 mg  by mouth daily as needed (heartburn)., Disp: , Rfl:    ondansetron (ZOFRAN) 8 MG tablet, Take 8 mg by mouth every 8 (eight) hours as needed for nausea/vomiting., Disp: , Rfl:    pantoprazole (PROTONIX) 40 MG tablet, Take 1 tablet (40 mg total) by mouth 2 (two) times daily before a meal. (Patient taking differently: Take 40 mg by mouth 2 (two) times daily as needed (acid reflux).), Disp: 180 tablet, Rfl: 3   sevelamer carbonate (RENVELA) 800 MG tablet, Take 1,600-3,200 mg by mouth See admin instructions. 2-4 tabs with meals depending on meal size, Disp: , Rfl:    umeclidinium-vilanterol (ANORO ELLIPTA) 62.5-25 MCG/INH AEPB, Inhale 1 puff into the lungs daily. (Patient taking differently: Inhale 1 puff into the lungs daily as needed (shortneess of breath).), Disp: 60 each, Rfl: 1   Exam: Current vital signs: BP 128/87   Pulse (!) 46   Temp 98.1 F (36.7 C) (Oral)   Resp (!) 26   Ht 5' 5"  (1.651 m)   Wt 84.8 kg   SpO2 98%   BMI 31.12 kg/m  Vital signs in last 24 hours: Temp:  [98.1 F (36.7 C)-98.4 F (36.9 C)] 98.1 F (36.7 C) (08/12 1622) Pulse Rate:  [46-65] 46 (08/12 1845) Resp:  [14-26] 26 (08/12 1845) BP: (128-163)/(73-99) 128/87 (08/12 1845) SpO2:  [93 %-100 %] 98 % (08/12 1845) Weight:  [84.8  kg] 84.8 kg (08/12 1232) General: Awake, somewhat tired appearing, alert in no distress HEENT: Normocephalic atraumatic Lungs: Clear Cardiovascular: Regular rhythm Abdomen nondistended nontender Extremities with trace pedal edema Neurological exam Awake but somewhat tired appearing-alert oriented x3 Speech is mildly dysarthric No evidence of aphasia Cranial nerves II to XII grossly intact Motor examination with left upper and lower extremity with barely 2/5 strength.  Right side is full strength. Sensation intact light touch without extinction Coordination exam with no dysmetria on the left.  Unable to perform on the right. NIH stroke scale 1a Level of Conscious.: 0 1b LOC Questions: 0 1c LOC Commands: 0 2 Best Gaze: 0 3 Visual: 0 4 Facial Palsy: 0 5a Motor Arm - left:  5b Motor Arm - Right: 0 6a Motor Leg - Left: 2 6b Motor Leg - Right: 0 7 Limb Ataxia: 2 8 Sensory: 0 9 Best Language: 0 10 Dysarthria: 1 11 Extinct. and Inatten.: 0 TOTAL: 5   Labs I have reviewed labs in epic and the results pertinent to this consultation are: Anemic with a hemoglobin 6.8  CBC    Component Value Date/Time   WBC 3.0 (L) 09/14/2021 1323   RBC 1.95 (L) 09/14/2021 1323   HGB 6.8 (LL) 09/14/2021 1323   HGB 7.6 (L) 11/01/2020 0918   HCT 20.0 (L) 09/14/2021 1323   HCT 26.5 (L) 11/01/2020 0918   PLT 137 (L) 09/14/2021 1323   PLT 362 11/01/2020 0918   MCV 102.6 (H) 09/14/2021 1323   MCV 74 (L) 11/01/2020 0918   MCH 34.9 (H) 09/14/2021 1323   MCHC 34.0 09/14/2021 1323   RDW 14.6 09/14/2021 1323   RDW 16.8 (H) 11/01/2020 0918   LYMPHSABS 0.5 (L) 09/14/2021 1323   MONOABS 0.3 09/14/2021 1323   EOSABS 0.2 09/14/2021 1323   BASOSABS 0.0 09/14/2021 1323    CMP     Component Value Date/Time   NA 139 09/14/2021 1323   K 3.6 09/14/2021 1323   CL 102 09/14/2021 1323   CO2 29 09/14/2021 1323   GLUCOSE 95 09/14/2021 1323   BUN 23 (  H) 09/14/2021 1323   CREATININE 7.52 (H) 09/14/2021  1323   CALCIUM 10.1 09/14/2021 1323   CALCIUM 7.7 (L) 02/18/2007 1522   PROT 6.1 (L) 09/14/2021 1323   ALBUMIN 3.3 (L) 09/14/2021 1323   AST 15 09/14/2021 1323   ALT 12 09/14/2021 1323   ALKPHOS 52 09/14/2021 1323   BILITOT 0.4 09/14/2021 1323   GFRNONAA 8 (L) 09/14/2021 1323   GFRAA 7 (L) 12/25/2017 1243    Imaging I have reviewed the images obtained:  MRI examination of the brain: Acute infarct involving the right precentral gyrus extending into the corona radiata and more anterior frontal lobe.  Moderate atrophy and chronic white matter disease.  Remote lacunar infarct of the left thalamus  Assessment: 56 year old with ESRD on hemodialysis Monday Wednesday Friday, hypertension, hyperlipidemia amongst other comorbidities presenting for evaluation of sudden onset of left-sided weakness for the last 3 days.  Outside the window for any intervention. MRI imaging of the brain reveals a right precentral gyrus/frontal lobe infarct-likely small vessel etiology.  Impression: Acute ischemic stroke-likely small vessel etiology  Recommendations: Admit to hospitalist Frequent neurochecks No antiplatelets or anticoagulants given anemia CT angiography head and neck 2D echo A1c Lipid panel PT OT Speech therapy Permissive hypertension not indicated-already 3 days from last known well.  Goal blood pressure normotension. Management of medical comorbidities including anemia and ESRD per primary team. Stroke team will follow Plan relayed to the primary team resident physician Dr. Madison Hickman via secure chat  -- Amie Portland, MD Neurologist [facility] Pager: [phone]

## 2021-09-14 NOTE — Assessment & Plan Note (Addendum)
Chronic anemia with macrocytic component.  No signs of blood loss or symptoms of anemia. Not on EPO agents according to medication list, likely relating to most recently being treated with radiation. Hgb remains unchanged at 6.8. -Monitor CBC -Hb corrected to 7.7 today, cancelled PRBC transfusion

## 2021-09-14 NOTE — Assessment & Plan Note (Signed)
Hypertensive up to 163/95.  Medication reconciliation information difficult to obtain during admission.  Potential home medications include amlodipine 10 mg daily, clonidine 0.3 mg patch weekly, hydralazine 25 mg twice daily, Isordil 20 mg twice daily, metoprolol tartrate 100 mg to take after dialysis. -Continue amlodipine 10 mg daily, clonidine 0.3 mg weekly, hydralazine 25 mg twice daily, metoprolol tartrate 100 mg twice daily, Isordil 20 mg BID -per neurology permissive hypertension (OK if < 220/120) but gradually normalize in 5-7 days

## 2021-09-14 NOTE — Assessment & Plan Note (Signed)
Dialysis MWF, went to dialysis on Friday.  Anuric with exception of few drops of urine.  Home medications include Renvela 3 times daily.  Presents with appropriate electrolytes and creatinine 7.52, GFR 8.  Not requiring emergent dialysis at this time. -Continue Renvela -Consult nephrology tomorrow a.m. for dialysis on MWF

## 2021-09-14 NOTE — Progress Notes (Signed)
Family medicine teaching service will be admitting this patient. Our pager information can be located in the physician sticky notes, treatment team sticky notes, and the headers of all our official daily progress notes.   FAMILY MEDICINE TEACHING SERVICE Patient - Please contact intern pager [phone] or text page via website [email or web address] (login: [email or web address]) for questions regarding care. DO NOT page listed attending provider unless there is no answer from the number above.   James Rainford, MD PGY-3, [facility] pager [phone]   

## 2021-09-14 NOTE — Assessment & Plan Note (Addendum)
Unfortunately, out of window for acute intervention. MRI shows acute nonhemorrhagic infarct involving left precentral gyrus extending into corona radiata and more anterior frontal lobe in addition to remote lacunar infarct of left thalamus. -Neurology following, appreciate recommendations, patient started on Plavix -Neurochecks every 4 hours, vital signs per unit, continuous cardiac monitoring x24 hours -SLP, PT, OT -Not requiring permissive hypertensive measures -Requires 30 day heart monitor on d/c

## 2021-09-14 NOTE — H&P (Cosign Needed)
Hospital Admission History and Physical Service Pager: [phone]  Patient name: James Wall [facility] Medical record number: [id number] Date of Birth: 1965/05/13 Age: 56 y.o. Gender: male  Primary Care Provider: Wells Guiles, DO Consultants: Neurology Code Status: Full Preferred Emergency Contact:  Contact Information     Name Relation Home Work Mobile   [place] Friend [phone]  [phone]   Chryl Heck [phone]  [phone]      Chief Complaint: Left-sided weakness  Assessment and Plan: James Wall is a 56 y.o. male presenting with left sided weakness identified as stroke on MRI brain.   * Stroke [facility]) Unfortunately, out of window for acute intervention.  Neurology consulted by ED provider with recommendations below.  MRI shows acute nonhemorrhagic infarct involving left precentral gyrus extending into corona radiata and more anterior frontal lobe in addition to remote lacunar infarct of left thalamus. -Admit to FPTS, attending Dr. Nori Riis, progressive unit -Neurology consulted, appreciate recommendations -Neurochecks every 4 hours, vital signs per unit, continuous cardiac monitoring x24 hours -CTA head/neck -Echocardiogram -Lipid panel, A1c, TSH -SLP, PT, OT -Not requiring permissive hypertensive measures  ESRD on dialysis [facility]) Dialysis MWF, went to dialysis on Friday.  Anuric with exception of few drops of urine.  Home medications include Renvela 3 times daily.  Presents with appropriate electrolytes and creatinine 7.52, GFR 8.  Not requiring emergent dialysis at this time. -Continue Renvela -Consult nephrology tomorrow a.m. for dialysis on MWF  Anemia of renal disease Chronic anemia which appears macrocytic.  With macrocytic component.  No signs of blood loss or symptoms of anemia. Not on EPO agents according to medication list.  -Monitor CBC -Discuss EPO agents with Nephrology  Essential hypertension Hypertensive up to 163/95.   Medication reconciliation information difficult to obtain during admission.  Potential home medications include amlodipine 10 mg daily, clonidine 0.3 mg patch weekly, hydralazine 25 mg twice daily, Isordil 20 mg twice daily, metoprolol tartrate 100 mg to take after dialysis. -Continue amlodipine 10 mg daily, clonidine 0.3 mg weekly, hydralazine 25 mg twice daily -Hold metoprolol, currently in the setting of bradycardia   FEN/GI: Renal/carb modified VTE Prophylaxis: SCDs  Disposition: Progressive  History of Present Illness:  James Wall is a 56 y.o. male presenting with left sided upper and lower extremity weakness for the last 3 days.  Was unable to hold anything in his left hand beginning in Wednesday. Went into the kitchen to cut vegetables and was unable to hold the vegetable. He was also unable to open jar with his hand. Notes this morning he tried walking down a slope to see if his leg was affected and realized he's been walking slower and not like he normally does. He also had some trouble getting back up the driveway.  Denies any worsening or improvement since initial event.  Speech unaffected.  Went to dialysis on Friday. Does not urinate anymore but does get the urge to urinate. Had a blood transfusion 1 month ago and 2 weeks ago because of his anemia. They aim for goal of Hgb >7.0. Denies blood in stool.  Just finished 40 days of radiation for prostate cancer last Wednesday. Notes he gets hot flashes from the hormone shots.   In the ED, presented with left-sided weakness in left arm and leg.  Lab work-up revealed elevated creatinine to 7.5 and hemoglobin 6.8.  MRI revealed stroke and admitted with neurology consulted by ED provider.  Review Of Systems: Per HPI  Pertinent Past Medical History: ESRD on  HD Prostate cancer Chronic anemia  Remainder reviewed in history tab.   Pertinent Past Surgical History: Dialysis catheter insertion  Remainder reviewed in history tab.   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   Important Outpatient Medications: Amlodipine 75m daily Clonidine path weekly Remainder reviewed in medication history.   Objective: BP (!) 142/100   Pulse (!) 48   Temp 98.2 F (36.8 C) (Oral)   Resp 20   Ht 5' 5"  (1.651 m)   Wt 84.8 kg   SpO2 99%   BMI 31.12 kg/m  Exam: General: Awake, alert and appropriately responsive in NAD, tearful, conversationally appropriate HEENT: NCAT. EOMI, PERRL. Oropharynx clear. MMM. Neck: Supple.  Normal ROM Chest: CTAB, normal WOB. Good air movement bilaterally.   Heart: Bradycardic, normal rhythm, no murmurs auscultated Abdomen: Soft, non-tender, non-distended. Normoactive bowel sounds Extremities: Extremities WWP. Moves all extremities equally. MSK: Normal bulk and tone Neuro: Appropriately responsive to stimuli.  CN II through XII intact, speech appropriate, strength 2/5 of left upper and lower extremity.  Right side strength 5/5.  Sensation intact bilaterally.  Finger-nose with right upper extremity normal, unable to perform on left Skin: No rashes or lesions appreciated.   Labs:  CBC BMET  Recent Labs  Lab 09/14/21 1323  WBC 3.0*  HGB 6.8*  HCT 20.0*  PLT 137*   Recent Labs  Lab 09/14/21 1323  NA 139  K 3.6  CL 102  CO2 29  BUN 23*  CREATININE 7.52*  GLUCOSE 95  CALCIUM 10.1     PT/INR: 14/1.1 Alcohol: Less than 10  EKG: NSR, left axis deviation, no evidence of QT prolongation  Imaging Studies Performed: Brain MRI: 1. Acute nonhemorrhagic infarct involving the left precentral gyrus extending into the corona radiata and more anterior frontal lobe. 2. Moderate atrophy and white matter disease likely reflects the sequela of chronic microvascular  ischemia. 3. Remote lacunar infarct of the left thalamus.  DWells Guiles DO 09/14/2021, 10:23 PM PGY-2, [place] pager: [phone] text pages welcome Secure chat group [email or web address]

## 2021-09-14 NOTE — ED Notes (Signed)
Patient given sandwich bag and drink.

## 2021-09-14 NOTE — ED Provider Notes (Signed)
[facility] EMERGENCY DEPARTMENT Provider Note   CSN: [id number] Arrival date & time: 09/14/21  1217     History  Chief Complaint  Patient presents with   Weakness    James Wall is a 56 y.o. male.  Pt with hx esrd/hd, states left-sided weakness for the past few days. Symptoms gradual onset, constant, persistent, without acute or abrupt worsening today or in past 24 hours. States his left arm and leg feel weaker than normal, and also acknowledges he continues to feel generally weak on a chronic basis. Denies fever or chills. No headache. No falls, is able to walk per normal. Denies neck or back pain, denies radicular pain. No numbness. No change in speech or vision. Denies hx cva. Denies acute blood loss, no rectal bleeding or melena. Went to normal dialysis Friday/yesterday. Denies med change.    The history is provided by the patient and medical records.  Weakness Associated symptoms: no abdominal pain, no chest pain, no cough, no diarrhea, no dysuria, no fever, no headaches, no shortness of breath and no vomiting        Home Medications Prior to Admission medications   Medication Sig Start Date End Date Taking? Authorizing Provider  acetaminophen (TYLENOL) 500 MG tablet Take 500 mg by mouth every 6 (six) hours as needed.    [provider]  amLODipine (NORVASC) 10 MG tablet TAKE 1 TABLET(10 MG) BY MOUTH DAILY Patient taking differently: Take 10 mg by mouth at bedtime. 04/26/18   Allred, Jeneen Rinks, MD  chlorproMAZINE (THORAZINE) 10 MG tablet Take 1 tablet (10 mg total) by mouth 3 (three) times daily as needed for hiccups. 08/10/20   Allie Bossier, MD  cloNIDine (CATAPRES - DOSED IN MG/24 HR) 0.3 mg/24hr patch Place 0.3 mg onto the skin every Sunday. 12/29/18   [provider]  heparin 1000 unit/mL SOLN injection Heparin Sodium (Porcine) 1,000 Units/mL Systemic 11/21/20 11/20/21  [provider]  hydrALAZINE (APRESOLINE) 25 MG tablet  Take 1 tablet (25 mg total) by mouth 2 (two) times daily. Patient taking differently: Take 25 mg by mouth 2 (two) times daily. 09/13/20   Werner Lean, MD  hydrOXYzine (ATARAX/VISTARIL) 25 MG tablet Take 1 tablet (25 mg total) by mouth every 8 (eight) hours as needed for itching. Further refills need to come from your PCP. 04/23/17   Dorothy Spark, MD  Ipratropium-Albuterol (COMBIVENT) 20-100 MCG/ACT AERS respimat Inhale 1 puff into the lungs every 6 (six) hours as needed for wheezing. 08/10/20   Allie Bossier, MD  isosorbide dinitrate (ISORDIL) 20 MG tablet TAKE 1 TABLET(20 MG) BY MOUTH TWICE DAILY Patient taking differently: Take 20 mg by mouth at bedtime. TAKE 1 TABLET(20 MG) BY MOUTH TWICE DAILY 11/01/20   Chandrasekhar, Mahesh A, MD  metoprolol tartrate (LOPRESSOR) 100 MG tablet Take 1 tablet by mouth twice a day.  ON DIALYSIS DAYS, TAKE AFTER DIALYSIS. Patient taking differently: as directed. Take 1 tablet by mouth twice a day.  ON DIALYSIS DAYS, TAKE AFTER DIALYSIS. 11/01/20   Rudean Haskell A, MD  omeprazole (PRILOSEC) 40 MG capsule Take 40 mg by mouth daily as needed (heartburn). 02/27/21   [provider]  ondansetron (ZOFRAN) 8 MG tablet Take 8 mg by mouth every 8 (eight) hours as needed for nausea/vomiting. 05/14/21   [provider]  oxybutynin (DITROPAN XL) 10 MG 24 hr tablet Take 1 tablet (10 mg total) by mouth at bedtime. Patient taking differently: Take 10 mg by mouth  daily as needed (frequency). 03/21/21   Bruning, Ashlyn, PA-C  pantoprazole (PROTONIX) 40 MG tablet Take 1 tablet (40 mg total) by mouth 2 (two) times daily before a meal. Patient taking differently: Take 40 mg by mouth 2 (two) times daily as needed (acid reflux). 03/10/19   Thornton Park, MD  sevelamer carbonate (RENVELA) 800 MG tablet Take 1,600-3,200 mg by mouth See admin instructions. 2-4 tabs with meals depending on meal size    [provider]  tamsulosin (FLOMAX) 0.4 MG  CAPS capsule TAKE 2 CAPSULES(0.8 MG) BY MOUTH DAILY Patient taking differently: 0.8 mg at bedtime. 09/06/20   Cresenzo, Angelyn Punt, MD  umeclidinium-vilanterol (ANORO ELLIPTA) 62.5-25 MCG/INH AEPB Inhale 1 puff into the lungs daily. Patient taking differently: Inhale 1 puff into the lungs daily as needed (shortneess of breath). 09/20/19   Autry-Lott, Naaman Plummer, DO      Allergies    Venofer  [ferric oxide] and Aspirin    Review of Systems   Review of Systems  Constitutional:  Negative for fever.  HENT:  Negative for sore throat and trouble swallowing.   Eyes:  Negative for pain and visual disturbance.  Respiratory:  Negative for cough and shortness of breath.   Cardiovascular:  Negative for chest pain and leg swelling.  Gastrointestinal:  Negative for abdominal pain, blood in stool, diarrhea and vomiting.  Genitourinary:  Negative for dysuria and flank pain.  Musculoskeletal:  Negative for back pain and neck pain.  Skin:  Negative for rash.  Neurological:  Positive for weakness. Negative for syncope, speech difficulty and headaches.  Hematological:  Does not bruise/bleed easily.  Psychiatric/Behavioral:  Negative for confusion.     Physical Exam Updated Vital Signs BP (!) 147/87   Pulse (!) 58   Temp 98.4 F (36.9 C) (Oral)   Resp 14   Ht 1.651 m (5' 5" )   Wt 84.8 kg   SpO2 99%   BMI 31.12 kg/m  Physical Exam Vitals and nursing note reviewed.  Constitutional:      Appearance: Normal appearance. He is well-developed.  HENT:     Head: Atraumatic.     Nose: Nose normal.     Mouth/Throat:     Mouth: Mucous membranes are moist.     Pharynx: Oropharynx is clear.  Eyes:     General: No scleral icterus.    Conjunctiva/sclera: Conjunctivae normal.     Pupils: Pupils are equal, round, and reactive to light.  Neck:     Vascular: No carotid bruit.     Trachea: No tracheal deviation.     Comments: Normal rom without pain. No radicular pain.  Cardiovascular:     Rate and Rhythm: Normal  rate and regular rhythm.     Pulses: Normal pulses.     Heart sounds: Normal heart sounds. No murmur heard.    No friction rub. No gallop.  Pulmonary:     Effort: Pulmonary effort is normal. No accessory muscle usage or respiratory distress.     Breath sounds: Normal breath sounds.     Comments: HD cath without sign of infection Abdominal:     General: Bowel sounds are normal. There is no distension.     Palpations: Abdomen is soft.     Tenderness: There is no abdominal tenderness. There is no guarding.  Genitourinary:    Comments: No cva tenderness. Musculoskeletal:        General: No swelling.     Cervical back: Normal range of motion and neck supple. No  rigidity or tenderness.     Right lower leg: No edema.     Left lower leg: No edema.     Comments: C spine non tender, normal rom.   Skin:    General: Skin is warm and dry.     Findings: No rash.  Neurological:     Mental Status: He is alert and oriented to person, place, and time.     Cranial Nerves: No cranial nerve deficit.     Comments: Alert, speech clear. Pt with very poor/inconsistent effort on neuro exam. On right, strength generally 4/5 with giveway weakness, and on left 3-4/5 also w giveway weakness.    Psychiatric:        Mood and Affect: Mood normal.     ED Results / Procedures / Treatments   Labs (all labs ordered are listed, but only abnormal results are displayed) Results for orders placed or performed during the hospital encounter of 09/14/21  Protime-INR  Result Value Ref Range   Prothrombin Time 14.0 11.4 - 15.2 seconds   INR 1.1 0.8 - 1.2  APTT  Result Value Ref Range   aPTT 33 24 - 36 seconds  CBC  Result Value Ref Range   WBC 3.0 (L) 4.0 - 10.5 K/uL   RBC 1.95 (L) 4.22 - 5.81 MIL/uL   Hemoglobin 6.8 (LL) 13.0 - 17.0 g/dL   HCT 20.0 (L) 39.0 - 52.0 %   MCV 102.6 (H) 80.0 - 100.0 fL   MCH 34.9 (H) 26.0 - 34.0 pg   MCHC 34.0 30.0 - 36.0 g/dL   RDW 14.6 11.5 - 15.5 %   Platelets 137 (L) 150 -  400 K/uL   nRBC 0.0 0.0 - 0.2 %  Differential  Result Value Ref Range   Neutrophils Relative % 67 %   Neutro Abs 2.0 1.7 - 7.7 K/uL   Lymphocytes Relative 16 %   Lymphs Abs 0.5 (L) 0.7 - 4.0 K/uL   Monocytes Relative 11 %   Monocytes Absolute 0.3 0.1 - 1.0 K/uL   Eosinophils Relative 6 %   Eosinophils Absolute 0.2 0.0 - 0.5 K/uL   Basophils Relative 0 %   Basophils Absolute 0.0 0.0 - 0.1 K/uL   Immature Granulocytes 0 %   Abs Immature Granulocytes 0.01 0.00 - 0.07 K/uL  Comprehensive metabolic panel  Result Value Ref Range   Sodium 139 135 - 145 mmol/L   Potassium 3.6 3.5 - 5.1 mmol/L   Chloride 102 98 - 111 mmol/L   CO2 29 22 - 32 mmol/L   Glucose, Bld 95 70 - 99 mg/dL   BUN 23 (H) 6 - 20 mg/dL   Creatinine, Ser 7.52 (H) 0.61 - 1.24 mg/dL   Calcium 10.1 8.9 - 10.3 mg/dL   Total Protein 6.1 (L) 6.5 - 8.1 g/dL   Albumin 3.3 (L) 3.5 - 5.0 g/dL   AST 15 15 - 41 U/L   ALT 12 0 - 44 U/L   Alkaline Phosphatase 52 38 - 126 U/L   Total Bilirubin 0.4 0.3 - 1.2 mg/dL   GFR, Estimated 8 (L) >60 mL/min   Anion gap 8 5 - 15  Ethanol  Result Value Ref Range   Alcohol, Ethyl (B) <10 <10 mg/dL  I-Stat beta hCG blood, ED ([facility], [facility], [facility] only)  Result Value Ref Range   I-stat hCG, quantitative <5.0 <5 mIU/mL   Comment 3          Type and screen [place]  Result Value Ref Range   ABO/RH(D) O POS    Antibody Screen NEG    Sample Expiration      09/17/2021,2359 Performed at [facility], [place] [address], [place], [place] [address]       EKG EKG Interpretation  Date/Time:  Saturday September 14 2021 12:26:44 EDT Ventricular Rate:  65 PR Interval:  200 QRS Duration: 98 QT Interval:  432 QTC Calculation: 449 R Axis:   -51 Text Interpretation: Normal sinus rhythm Left axis deviation Nonspecific T wave abnormality Confirmed by Lajean Saver [phone]) on 09/14/2021 6:43:36 PM  Radiology MR BRAIN WO CONTRAST  Result Date: 09/14/2021 CLINICAL DATA:  Neuro  deficit, acute, stroke suspected. Increased weakness, asymmetric on the left since Wednesday. EXAM: MRI HEAD WITHOUT CONTRAST TECHNIQUE: Multiplanar, multiecho pulse sequences of the brain and surrounding structures were obtained without intravenous contrast. COMPARISON:  Iron infusion yesterday. FINDINGS: Brain: Acute nonhemorrhagic infarct is demonstrated in the left precentral gyrus extending into the corona radiata. There is some involvement of more anterior frontal lobe as well. T2 and FLAIR changes are associated. Moderate atrophy and scattered periventricular subcortical T2 hyperintensities are present bilaterally separate from the area of acute/subacute infarct. Dilated perivascular spaces are present throughout the basal ganglia. The ventricles are of normal size. Remote lacunar infarct is present in the left thalamus. The internal auditory canals are within normal limits. The brainstem and cerebellum are within normal limits. Vascular: Flow is present in the major intracranial arteries. Skull and upper cervical spine: The craniocervical junction is normal. Upper cervical spine is within normal limits. Marrow signal is unremarkable. Sinuses/Orbits: The paranasal sinuses and mastoid air cells are clear. The globes and orbits are within normal limits. IMPRESSION: 1. Acute nonhemorrhagic infarct involving the left precentral gyrus extending into the corona radiata and more anterior frontal lobe. 2. Moderate atrophy and white matter disease likely reflects the sequela of chronic microvascular ischemia. 3. Remote lacunar infarct of the left thalamus. These results were called by telephone at the time of interpretation on 09/14/2021 at 6:27 pm to provider [facility] , who verbally acknowledged these results. Electronically Signed   By: San Morelle M.D.   On: 09/14/2021 18:30    Procedures Procedures    Medications Ordered in ED Medications  sodium chloride flush (NS) 0.9 % injection 3 mL (has no  administration in time range)    ED Course/ Medical Decision Making/ A&P                           Medical Decision Making Problems Addressed: Acute cerebrovascular accident (CVA) [facility]): acute illness or injury with systemic symptoms that poses a threat to life or bodily functions Chronic anemia: chronic illness or injury with exacerbation, progression, or side effects of treatment that poses a threat to life or bodily functions ESRD on dialysis [facility]): chronic illness or injury that poses a threat to life or bodily functions History of prostate cancer: chronic illness or injury that poses a threat to life or bodily functions Symptomatic anemia: chronic illness or injury with exacerbation, progression, or side effects of treatment that poses a threat to life or bodily functions  Amount and/or Complexity of Data Reviewed Independent Historian: EMS External Data Reviewed: notes. Labs: ordered. Decision-making details documented in ED Course. Radiology: ordered and independent interpretation performed. Decision-making details documented in ED Course. ECG/medicine tests: ordered and independent interpretation performed. Decision-making details documented in ED Course. Discussion of management or test  interpretation with external provider(s): Radiology, neurology, fp  Risk Prescription drug management. Decision regarding hospitalization.  Iv ns. Continuous pulse ox and cardiac monitoring. Labs ordered/sent. Imaging ordered.   Reviewed nursing notes and prior charts for additional history. External reports reviewed.   Cardiac monitor: sinus rhythm, rate 60.  Labs reviewed/interpreted by me - hgb 6.8 - in similar range to prior.   MRI reviewed/interpreted by me - c/w acute cva.   Neurology consulted, discussed pt - they will see in consult, request medicine admission.  Independence consulted for admission.              Final Clinical Impression(s) / ED Diagnoses Final diagnoses:   None    Rx / DC Orders ED Discharge Orders     None         Lajean Saver, MD 09/14/21 1845

## 2021-09-14 NOTE — ED Triage Notes (Signed)
Pt states increased weakness, esp L sided, since Wed.  States was given iron infusion at dialysis yesterday with some improvement.  However, yesterday afternoon pt could barely hold vegetables to cut them because his L arm was so week.  Pt also c/o sob with exertion.

## 2021-09-15 ENCOUNTER — Encounter (HOSPITAL_COMMUNITY): Payer: Self-pay | Admitting: Student

## 2021-09-15 ENCOUNTER — Other Ambulatory Visit: Payer: Self-pay

## 2021-09-15 ENCOUNTER — Observation Stay (HOSPITAL_COMMUNITY): Payer: Medicare Other

## 2021-09-15 DIAGNOSIS — Z823 Family history of stroke: Secondary | ICD-10-CM | POA: Diagnosis not present

## 2021-09-15 DIAGNOSIS — I1311 Hypertensive heart and chronic kidney disease without heart failure, with stage 5 chronic kidney disease, or end stage renal disease: Secondary | ICD-10-CM | POA: Diagnosis not present

## 2021-09-15 DIAGNOSIS — K7460 Unspecified cirrhosis of liver: Secondary | ICD-10-CM | POA: Diagnosis not present

## 2021-09-15 DIAGNOSIS — D649 Anemia, unspecified: Secondary | ICD-10-CM | POA: Diagnosis not present

## 2021-09-15 DIAGNOSIS — R188 Other ascites: Secondary | ICD-10-CM | POA: Diagnosis present

## 2021-09-15 DIAGNOSIS — I63131 Cerebral infarction due to embolism of right carotid artery: Secondary | ICD-10-CM | POA: Diagnosis not present

## 2021-09-15 DIAGNOSIS — E785 Hyperlipidemia, unspecified: Secondary | ICD-10-CM | POA: Diagnosis present

## 2021-09-15 DIAGNOSIS — F32A Depression, unspecified: Secondary | ICD-10-CM | POA: Diagnosis present

## 2021-09-15 DIAGNOSIS — D631 Anemia in chronic kidney disease: Secondary | ICD-10-CM | POA: Diagnosis not present

## 2021-09-15 DIAGNOSIS — K219 Gastro-esophageal reflux disease without esophagitis: Secondary | ICD-10-CM | POA: Diagnosis not present

## 2021-09-15 DIAGNOSIS — G8194 Hemiplegia, unspecified affecting left nondominant side: Secondary | ICD-10-CM | POA: Diagnosis present

## 2021-09-15 DIAGNOSIS — I69322 Dysarthria following cerebral infarction: Secondary | ICD-10-CM | POA: Diagnosis not present

## 2021-09-15 DIAGNOSIS — I639 Cerebral infarction, unspecified: Secondary | ICD-10-CM | POA: Diagnosis not present

## 2021-09-15 DIAGNOSIS — N185 Chronic kidney disease, stage 5: Secondary | ICD-10-CM | POA: Diagnosis not present

## 2021-09-15 DIAGNOSIS — I63511 Cerebral infarction due to unspecified occlusion or stenosis of right middle cerebral artery: Secondary | ICD-10-CM

## 2021-09-15 DIAGNOSIS — R29705 NIHSS score 5: Secondary | ICD-10-CM | POA: Diagnosis present

## 2021-09-15 DIAGNOSIS — Y832 Surgical operation with anastomosis, bypass or graft as the cause of abnormal reaction of the patient, or of later complication, without mention of misadventure at the time of the procedure: Secondary | ICD-10-CM | POA: Diagnosis not present

## 2021-09-15 DIAGNOSIS — Z992 Dependence on renal dialysis: Secondary | ICD-10-CM | POA: Diagnosis not present

## 2021-09-15 DIAGNOSIS — J449 Chronic obstructive pulmonary disease, unspecified: Secondary | ICD-10-CM | POA: Diagnosis not present

## 2021-09-15 DIAGNOSIS — Z7902 Long term (current) use of antithrombotics/antiplatelets: Secondary | ICD-10-CM | POA: Diagnosis not present

## 2021-09-15 DIAGNOSIS — N250 Renal osteodystrophy: Secondary | ICD-10-CM | POA: Diagnosis not present

## 2021-09-15 DIAGNOSIS — I509 Heart failure, unspecified: Secondary | ICD-10-CM | POA: Diagnosis not present

## 2021-09-15 DIAGNOSIS — E1122 Type 2 diabetes mellitus with diabetic chronic kidney disease: Secondary | ICD-10-CM | POA: Diagnosis not present

## 2021-09-15 DIAGNOSIS — N186 End stage renal disease: Secondary | ICD-10-CM | POA: Diagnosis not present

## 2021-09-15 DIAGNOSIS — Z79899 Other long term (current) drug therapy: Secondary | ICD-10-CM | POA: Diagnosis not present

## 2021-09-15 DIAGNOSIS — Z8546 Personal history of malignant neoplasm of prostate: Secondary | ICD-10-CM | POA: Diagnosis not present

## 2021-09-15 DIAGNOSIS — I272 Pulmonary hypertension, unspecified: Secondary | ICD-10-CM | POA: Diagnosis not present

## 2021-09-15 DIAGNOSIS — I6521 Occlusion and stenosis of right carotid artery: Secondary | ICD-10-CM | POA: Diagnosis present

## 2021-09-15 DIAGNOSIS — G4733 Obstructive sleep apnea (adult) (pediatric): Secondary | ICD-10-CM | POA: Diagnosis present

## 2021-09-15 DIAGNOSIS — N2581 Secondary hyperparathyroidism of renal origin: Secondary | ICD-10-CM | POA: Diagnosis present

## 2021-09-15 DIAGNOSIS — D696 Thrombocytopenia, unspecified: Secondary | ICD-10-CM | POA: Diagnosis not present

## 2021-09-15 DIAGNOSIS — T8242XA Displacement of vascular dialysis catheter, initial encounter: Secondary | ICD-10-CM | POA: Diagnosis not present

## 2021-09-15 DIAGNOSIS — I428 Other cardiomyopathies: Secondary | ICD-10-CM | POA: Diagnosis not present

## 2021-09-15 DIAGNOSIS — I12 Hypertensive chronic kidney disease with 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: Secondary | ICD-10-CM | POA: Diagnosis not present

## 2021-09-15 DIAGNOSIS — Z8249 Family history of ischemic heart disease and other diseases of the circulatory system: Secondary | ICD-10-CM | POA: Diagnosis not present

## 2021-09-15 DIAGNOSIS — N189 Chronic kidney disease, unspecified: Secondary | ICD-10-CM | POA: Diagnosis not present

## 2021-09-15 DIAGNOSIS — I132 Hypertensive heart and chronic kidney disease with heart failure and with stage 5 chronic kidney disease, or end stage renal disease: Secondary | ICD-10-CM | POA: Diagnosis not present

## 2021-09-15 DIAGNOSIS — M898X9 Other specified disorders of bone, unspecified site: Secondary | ICD-10-CM | POA: Diagnosis present

## 2021-09-15 DIAGNOSIS — I69354 Hemiplegia and hemiparesis following cerebral infarction affecting left non-dominant side: Secondary | ICD-10-CM | POA: Diagnosis not present

## 2021-09-15 DIAGNOSIS — I10 Essential (primary) hypertension: Secondary | ICD-10-CM | POA: Diagnosis not present

## 2021-09-15 DIAGNOSIS — R531 Weakness: Secondary | ICD-10-CM | POA: Diagnosis not present

## 2021-09-15 DIAGNOSIS — F17210 Nicotine dependence, cigarettes, uncomplicated: Secondary | ICD-10-CM | POA: Diagnosis present

## 2021-09-15 DIAGNOSIS — C61 Malignant neoplasm of prostate: Secondary | ICD-10-CM | POA: Diagnosis present

## 2021-09-15 DIAGNOSIS — E669 Obesity, unspecified: Secondary | ICD-10-CM | POA: Diagnosis present

## 2021-09-15 DIAGNOSIS — K5904 Chronic idiopathic constipation: Secondary | ICD-10-CM | POA: Diagnosis not present

## 2021-09-15 DIAGNOSIS — Z825 Family history of asthma and other chronic lower respiratory diseases: Secondary | ICD-10-CM | POA: Diagnosis not present

## 2021-09-15 DIAGNOSIS — Z923 Personal history of irradiation: Secondary | ICD-10-CM | POA: Diagnosis not present

## 2021-09-15 LAB — CBC WITH DIFFERENTIAL/PLATELET
Abs Immature Granulocytes: 0.01 10*3/uL (ref 0.00–0.07)
Basophils Absolute: 0 10*3/uL (ref 0.0–0.1)
Basophils Relative: 0 %
Eosinophils Absolute: 0.1 10*3/uL (ref 0.0–0.5)
Eosinophils Relative: 5 %
HCT: 21.3 % — ABNORMAL LOW (ref 39.0–52.0)
Hemoglobin: 6.8 g/dL — CL (ref 13.0–17.0)
Immature Granulocytes: 0 %
Lymphocytes Relative: 16 %
Lymphs Abs: 0.4 10*3/uL — ABNORMAL LOW (ref 0.7–4.0)
MCH: 33.7 pg (ref 26.0–34.0)
MCHC: 31.9 g/dL (ref 30.0–36.0)
MCV: 105.4 fL — ABNORMAL HIGH (ref 80.0–100.0)
Monocytes Absolute: 0.3 10*3/uL (ref 0.1–1.0)
Monocytes Relative: 12 %
Neutro Abs: 1.8 10*3/uL (ref 1.7–7.7)
Neutrophils Relative %: 67 %
Platelets: 135 10*3/uL — ABNORMAL LOW (ref 150–400)
RBC: 2.02 MIL/uL — ABNORMAL LOW (ref 4.22–5.81)
RDW: 14.8 % (ref 11.5–15.5)
WBC: 2.7 10*3/uL — ABNORMAL LOW (ref 4.0–10.5)
nRBC: 0 % (ref 0.0–0.2)

## 2021-09-15 LAB — COMPREHENSIVE METABOLIC PANEL
ALT: 9 U/L (ref 0–44)
AST: 12 U/L — ABNORMAL LOW (ref 15–41)
Albumin: 3.1 g/dL — ABNORMAL LOW (ref 3.5–5.0)
Alkaline Phosphatase: 50 U/L (ref 38–126)
Anion gap: 11 (ref 5–15)
BUN: 25 mg/dL — ABNORMAL HIGH (ref 6–20)
CO2: 26 mmol/L (ref 22–32)
Calcium: 9.8 mg/dL (ref 8.9–10.3)
Chloride: 101 mmol/L (ref 98–111)
Creatinine, Ser: 8.86 mg/dL — ABNORMAL HIGH (ref 0.61–1.24)
GFR, Estimated: 6 mL/min — ABNORMAL LOW (ref >60)
Glucose, Bld: 95 mg/dL (ref 70–99)
Potassium: 4 mmol/L (ref 3.5–5.1)
Sodium: 138 mmol/L (ref 135–145)
Total Bilirubin: 0.4 mg/dL (ref 0.3–1.2)
Total Protein: 5.7 g/dL — ABNORMAL LOW (ref 6.5–8.1)

## 2021-09-15 LAB — PREPARE RBC (CROSSMATCH)

## 2021-09-15 LAB — ECHOCARDIOGRAM COMPLETE
Area-P 1/2: 3.42 cm2
Height: 65 in
MV M vel: 5.21 m/s
MV Peak grad: 108.6 mmHg
S' Lateral: 3.7 cm
Weight: 2992 oz

## 2021-09-15 MED ORDER — SEVELAMER CARBONATE 800 MG PO TABS
3200.0000 mg | ORAL_TABLET | Freq: Three times a day (TID) | ORAL | Status: DC
  Administered 2021-09-15: 3200 mg via ORAL
  Filled 2021-09-15: qty 4

## 2021-09-15 MED ORDER — CLOPIDOGREL BISULFATE 75 MG PO TABS: 75.0000 mg | ORAL_TABLET | Freq: Every day | ORAL | Status: DC

## 2021-09-15 MED ORDER — ISOSORBIDE DINITRATE 20 MG PO TABS
20.0000 mg | ORAL_TABLET | Freq: Every day | ORAL | Status: DC
  Administered 2021-09-15: 20 mg via ORAL
  Filled 2021-09-15 (×2): qty 1
  Filled 2021-09-15: qty 2

## 2021-09-15 MED ORDER — CHLORHEXIDINE GLUCONATE CLOTH 2 % EX PADS
6.0000 | MEDICATED_PAD | Freq: Every day | CUTANEOUS | Status: DC
  Administered 2021-09-16: 6 via TOPICAL

## 2021-09-15 MED ORDER — METOPROLOL TARTRATE 50 MG PO TABS
100.0000 mg | ORAL_TABLET | Freq: Two times a day (BID) | ORAL | Status: DC
  Administered 2021-09-15 (×2): 100 mg via ORAL
  Filled 2021-09-15: qty 2
  Filled 2021-09-15: qty 4

## 2021-09-15 MED ORDER — SODIUM CHLORIDE 0.9% IV SOLUTION: Freq: Once | INTRAVENOUS | Status: AC

## 2021-09-15 MED ORDER — FAMOTIDINE 10 MG PO TABS
10.0000 mg | ORAL_TABLET | Freq: Every day | ORAL | Status: DC | PRN
  Administered 2021-09-15: 10 mg via ORAL
  Filled 2021-09-15: qty 1

## 2021-09-15 MED ORDER — ROSUVASTATIN CALCIUM 5 MG PO TABS
10.0000 mg | ORAL_TABLET | Freq: Every day | ORAL | Status: DC
  Administered 2021-09-15: 10 mg via ORAL
  Filled 2021-09-15: qty 2

## 2021-09-15 MED ORDER — CLOPIDOGREL BISULFATE 75 MG PO TABS
75.0000 mg | ORAL_TABLET | Freq: Every day | ORAL | Status: DC
  Administered 2021-09-15: 75 mg via ORAL
  Filled 2021-09-15: qty 1

## 2021-09-15 NOTE — ED Notes (Signed)
RN placed pt belongings on bed. Pt has phone in his hand and yellow change purse.

## 2021-09-15 NOTE — ED Notes (Signed)
RN assisted pt back into bed

## 2021-09-15 NOTE — Progress Notes (Addendum)
STROKE TEAM PROGRESS NOTE   INTERVAL HISTORY His family is at the bedside.  He is awake and alert sitting up in bed. Start Plavix today, will hold off on DAPT due to chronic anemia and current Hgb 6.8. Left arm able to slightly move against gravity, can not resist gravity. No movement in left fingers, no grip. Right leg able to lift. Left leg able to move with gravity eliminated.  sensation equal Recommend 30 day monitor MRI scan shows a large right frontal MCA branch infarct.  CT angiogram gram showed calcific stenosis of right ICA around 40%.  There is also moderate atheromatous cavernous sinus stenosis on the right. Vitals:   09/15/21 0500 09/15/21 0605 09/15/21 0611 09/15/21 0700  BP: (!) 169/92 (!) 187/103  (!) 185/86  Pulse: (!) 54 62  (!) 55  Resp: 16 20  20   Temp:   98 F (36.7 C)   TempSrc:   Oral   SpO2: 97% 92%  95%  Weight:      Height:       CBC:  Recent Labs  Lab 09/14/21 1323 09/15/21 0330  WBC 3.0* 2.7*  NEUTROABS 2.0 1.8  HGB 6.8* 6.8*  HCT 20.0* 21.3*  MCV 102.6* 105.4*  PLT 137* 122*   Basic Metabolic Panel:  Recent Labs  Lab 09/14/21 1323 09/14/21 2138 09/15/21 0329  NA 139  --  138  K 3.6  --  4.0  CL 102  --  101  CO2 29  --  26  GLUCOSE 95  --  95  BUN 23*  --  25*  CREATININE 7.52*  --  8.86*  CALCIUM 10.1  --  9.8  MG  --  2.1  --    Lipid Panel:  Recent Labs  Lab 09/14/21 2138  CHOL 126  TRIG 62  HDL 59  CHOLHDL 2.1  VLDL 12  LDLCALC 55   HgbA1c:  Recent Labs  Lab 09/14/21 2138  HGBA1C 4.1*   Urine Drug Screen: No results for input(s): "LABOPIA", "COCAINSCRNUR", "LABBENZ", "AMPHETMU", "THCU", "LABBARB" in the last 168 hours.  Alcohol Level  Recent Labs  Lab 09/14/21 1322  ETH <10    IMAGING past 24 hours CT ANGIO HEAD NECK W WO CM  Result Date: 09/14/2021 CLINICAL DATA:  Follow-up examination for stroke. EXAM: CT ANGIOGRAPHY HEAD AND NECK TECHNIQUE: Multidetector CT imaging of the head and neck was performed using the  standard protocol during bolus administration of intravenous contrast. Multiplanar CT image reconstructions and MIPs were obtained to evaluate the vascular anatomy. Carotid stenosis measurements (when applicable) are obtained utilizing NASCET criteria, using the distal internal carotid diameter as the denominator. RADIATION DOSE REDUCTION: This exam was performed according to the departmental dose-optimization program which includes automated exposure control, adjustment of the mA and/or kV according to patient size and/or use of iterative reconstruction technique. CONTRAST:  134m OMNIPAQUE IOHEXOL 350 MG/ML SOLN COMPARISON:  MRI from earlier the same day. FINDINGS: CT HEAD FINDINGS Brain: Atrophy with moderate chronic microvascular ischemic disease. Few identified right frontal infarct better seen on prior MRI. No other acute large vessel territory infarct. No intracranial hemorrhage. No mass lesion, midline shift or mass effect. No hydrocephalus or extra-axial fluid collection. Vascular: No hyperdense vessel. Calcified atherosclerosis present at skull base. Skull: Scalp soft tissues and calvarium within normal limits. Sinuses/Orbits: Globes orbital soft tissues within normal limits. Paranasal sinuses and mastoid air cells are largely clear. Other: None. Review of the MIP images confirms the above findings  CTA NECK FINDINGS Aortic arch: Visualized aortic arch normal caliber with standard branch pattern. Moderate atheromatous change about the arch itself. No high-grade stenosis about the origin the great vessels. Right carotid system: Bulky calcified plaque about the right carotid bulb/proximal right ICA with associated stenosis of up to 40% by NASCET criteria. No dissection or vascular occlusion. Left carotid system: Or few calcified plaque about the left carotid bulb/proximal left ICA without hemodynamically significant stenosis. No dissection or vascular occlusion. Vertebral arteries: Left vertebral artery  arises directly from the aortic arch. Right vertebral artery dominant. Extensive plaque about the proximal right vertebral artery with associated up to severe stenosis at the pre foraminal right V1 segment (a series 13, image 252). Vertebral arteries otherwise patent without stenosis or dissection. Skeleton: No visible discrete or worrisome osseous lesions. Moderate spondylosis present at C5-6. Other neck: Multiple prominent lymph nodes seen within the lower left neck, measuring up to 1.1 cm at the left supraclavicular fossa. No other acute abnormality. Upper chest: Extensive mediastinal adenopathy is seen, largest of which measures up to 1.3 cm at prevascular space of the indeterminate. Layering bilateral pleural effusions with evidence of pulmonary interstitial edema within the visualized lungs, suggesting CHF. Right-sided central venous catheter in place. Review of the MIP images confirms the above findings CTA HEAD FINDINGS Anterior circulation: Petrous segments patent bilaterally. Atheromatous change within the carotid siphons with up to moderate stenoses about the para clinoid segments bilaterally. A1 segments patent. Normal anterior communicating artery complex. Anterior cerebral arteries patent without stenosis. No M1 stenosis or occlusion. No proximal MCA branch occlusion. Distal MCA branches perfused and symmetric right Posterior circulation: Dominant right V4 segment widely patent. Right PICA grossly patent at its origin. Hypoplastic left vertebral artery terminates in PICA. Left PICA patent as well. Basilar patent to its distal aspect without stenosis. Superior cerebellar and posterior cerebral arteries patent bilaterally. Venous sinuses: Patent allowing for timing the contrast bolus. Anatomic variants: Hypoplastic left vertebral artery terminates in PICA. No intracranial aneurysm. Review of the MIP images confirms the above findings IMPRESSION: CT HEAD IMPRESSION: 1. No acute intracranial abnormality by  CT. Previously identified right MCA distribution infarct better seen on prior MRI. No intracranial hemorrhage. 2. Atrophy with moderate chronic microvascular ischemic disease. CTA HEAD AND NECK IMPRESSION: 1. Negative CTA for large vessel occlusion or other emergent finding. 2. Bulky calcified plaque about the right carotid bulb/proximal right ICA with associated stenosis of up to 40% by NASCET criteria. 3. Atheromatous change about the carotid siphons with up to moderate stenoses about the para clinoid segments bilaterally. 4. Extensive plaque about the proximal right vertebral artery with associated severe stenosis at the pre foraminal right V1 segment. Right vertebral artery dominant. Left vertebral artery hypoplastic and terminates in PICA, with no significant contribution to the posterior circulation. 5. Layering bilateral pleural effusions with pulmonary interstitial edema within the visualized lungs, suggesting CHF. 6. Prominent adenopathy within the lower left neck and visualized mediastinum. Findings are nonspecific, and could be reactive in nature. Possible nodal metastatic disease or lymphoproliferative disorder could also have this appearance. Electronically Signed   By: Jeannine Boga M.D.   On: 09/14/2021 23:48   MR BRAIN WO CONTRAST  Addendum Date: 09/14/2021   ADDENDUM REPORT: 09/14/2021 19:16 ADDENDUM: The acute infarct is in the right frontal lobe, not the left frontal lobe. Electronically Signed   By: San Morelle M.D.   On: 09/14/2021 19:16   Result Date: 09/14/2021 CLINICAL DATA:  Neuro deficit, acute, stroke suspected.  Increased weakness, asymmetric on the left since Wednesday. EXAM: MRI HEAD WITHOUT CONTRAST TECHNIQUE: Multiplanar, multiecho pulse sequences of the brain and surrounding structures were obtained without intravenous contrast. COMPARISON:  Iron infusion yesterday. FINDINGS: Brain: Acute nonhemorrhagic infarct is demonstrated in the left precentral gyrus  extending into the corona radiata. There is some involvement of more anterior frontal lobe as well. T2 and FLAIR changes are associated. Moderate atrophy and scattered periventricular subcortical T2 hyperintensities are present bilaterally separate from the area of acute/subacute infarct. Dilated perivascular spaces are present throughout the basal ganglia. The ventricles are of normal size. Remote lacunar infarct is present in the left thalamus. The internal auditory canals are within normal limits. The brainstem and cerebellum are within normal limits. Vascular: Flow is present in the major intracranial arteries. Skull and upper cervical spine: The craniocervical junction is normal. Upper cervical spine is within normal limits. Marrow signal is unremarkable. Sinuses/Orbits: The paranasal sinuses and mastoid air cells are clear. The globes and orbits are within normal limits. IMPRESSION: 1. Acute nonhemorrhagic infarct involving the left precentral gyrus extending into the corona radiata and more anterior frontal lobe. 2. Moderate atrophy and white matter disease likely reflects the sequela of chronic microvascular ischemia. 3. Remote lacunar infarct of the left thalamus. These results were called by telephone at the time of interpretation on 09/14/2021 at 6:27 pm to provider [facility] , who verbally acknowledged these results. Electronically Signed: By: San Morelle M.D. On: 09/14/2021 18:30    PHYSICAL EXAM  Temp:  [98 F (36.7 C)-98.4 F (36.9 C)] 98 F (36.7 C) (08/13 1023) Pulse Rate:  [46-105] 105 (08/13 1100) Resp:  [9-26] 14 (08/13 1100) BP: (128-187)/(73-103) 170/100 (08/13 1100) SpO2:  [90 %-100 %] 97 % (08/13 1100)  General - Well nourished, well developed, middle-aged African-American male in no apparent distress. Cardiovascular - Regular rhythm and rate.  Mental Status -  Level of arousal and orientation to time, place, and person were intact.Mild dysarthria, follows  commands Language including expression, naming, repetition, comprehension was assessed and found intact. Attention span and concentration were normal. Recent and remote memory were intact. Fund of Knowledge was assessed and was intact.  Cranial Nerves II - XII - II - Visual field intact OU. III, IV, VI - Extraocular movements intact. V - Facial sensation intact bilaterally. VII - Facial movement intact bilaterally. VIII - Hearing & vestibular intact bilaterally. X - Palate elevates symmetrically. XI - Chin turning & shoulder shrug intact bilaterally. XII - Tongue protrusion intact.  Motor Strength - The patient's strength was normal in right arm and right leg. Left arm 2/5 proximally, no grip on left left leg 2/5 proximally and distally.  Bulk was normal and fasciculations were absent.   Motor Tone - Muscle tone was assessed at the neck and appendages and was normal. Sensory - Light touch, temperature/pinprick were assessed and were symmetrical.   Coordination - The patient had normal movements in the hands and feet with no ataxia or dysmetria.  Tremor was absent. Gait and Station - deferred.   ASSESSMENT/PLAN Mr. James Wall is a 56 y.o. male with history of  ESRD on dialysis Monday Wednesday Friday, prostate cancer, nonalcoholic cirrhosis, hypertension, hyperlipidemia, hypertensive heart disease, recent anemia requiring blood transfusion with chronic anemia in his history, presenting for evaluation of left-sided weakness that started suddenly on Wednesday.  He reports being in his usual state of health prior to Wednesday, when he started noticing difficulty using his left arm.  This worsened to the point where he was having a hard time holding utensils with that arm.  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 Atrophy with  moderate chronic microvascular ischemic disease CTA head & neck - 1. Negative CTA for large vessel occlusion or other emergent finding. 2. Bulky calcified plaque about the right carotid bulb/proximal right ICA with associated stenosis of up to 40% by NASCET criteria. 3. Atheromatous change about the carotid siphons with up to moderate stenoses about the para clinoid segments bilaterally. 4. Extensive plaque about the proximal right vertebral artery with associated severe stenosis at the pre foraminal right V1 segment. Right vertebral artery dominant. Left vertebral artery hypoplastic and terminates in PICA, with no significant contribution to the posterior circulation. 5. Layering bilateral pleural effusions with pulmonary interstitial edema within the visualized lungs, suggesting CHF. 6. Prominent adenopathy within the lower left neck and visualized mediastinum. Findings are nonspecific, and could be reactive in nature. Possible nodal metastatic disease or lymphoproliferative disorder could also have this appearance.   MRI   Acute nonhemorrhagic infarct involving the right precentral gyrus extending into the corona radiata and more anterior frontal lobe. Moderate atrophy and white matter disease likely reflects the sequela of chronic microvascular ischemia. Remote lacunar infarct of the left thalamus.  2D Echo pending  Recommend 30 day monitoring  LDL 55 HgbA1c 4.1 VTE prophylaxis - SCD's    Diet   Diet renal/carb modified with fluid restriction Diet-HS Snack? Nothing; Fluid restriction: 1200 mL Fluid; Room service appropriate? Yes; Fluid consistency: Thin   No antithrombotic prior to admission, now on clopidogrel 75 mg daily Alone due to severe anemia with hemoglobin of 6.8 and patient having prior history of intolerance to aspirin Therapy recommendations:  pending  Disposition:  pending  Hypertension Home meds:  norvasc, clonidine, hydralazine isosorbide, metoprolo Stable Permissive  hypertension (OK if < 220/120) but gradually normalize in 5-7 days Long-term BP goal normotensive  Hyperlipidemia Home meds:  none,  LDL 55, goal < 70 High intensity statin not indicated due to below goal Continue statin at discharge   Other Stroke Risk Factors Cigarette smoker advised to stop smoking ETOH use, alcohol level <10, advised to drink no more than 1 drink(s) a day Obesity, Body mass index is 31.12 kg/m., BMI >/= 30 associated with increased stroke risk, recommend weight loss, diet and exercise as appropriate  Coronary artery disease Migraines Obstructive sleep apnea, not on CPAP at home  Other Active Problems ESRD on HD  GERD COPD Anxiety/depression   Hospital day # 0  Beulah Gandy DNP, ACNPC-AG    STROKE MD NOTE :  I have personally obtained history,examined this patient, reviewed notes, independently viewed imaging studies, participated in medical decision making and plan of care.ROS completed by me personally and pertinent positives fully documented  I have made any additions or clarifications directly to the above note. Agree with note above.  Patient presented with 3 to 4-day history of left hand weakness with MRI showing embolic looking right frontal cortical and subcortical infarct and CT angio showing calcific atherosclerosis of moderate degree of proximal right ICA as well as right cavernous carotid.  Patient may not be a good long-term dual antiplatelet therapy or anticoagulation candidate given history of severe anemia requiring blood transfusion.  And recommend Plavix alone and continue ongoing stroke work-up.  He may need 30-day  heart monitor at discharge.  Aggressive risk factor modification.  Long discussion with patient and family member at the bedside and answered questions.  Greater than 50% time during this 50-minute visit was spent on counseling and coordination of care about his embolic stroke and left hand weakness and patient about evaluation and  treatment and answering questions  Antony Contras, MD Medical Director [place] Pager: [phone] 09/15/2021 1:12 PM  To contact Stroke Continuity provider, please refer to [email or web address] After hours, contact General Neurology

## 2021-09-15 NOTE — ED Notes (Signed)
RN and OT moisturized pt feet and applied slip resistant socks

## 2021-09-15 NOTE — ED Notes (Signed)
Resident stated to keep an eye on pt at this time d/t asymptomatic. Medications will be reviewed.   When pt is aroused HR and RR increase WNL

## 2021-09-15 NOTE — Evaluation (Signed)
Physical Therapy Evaluation Patient Details Name: James Wall MRN: [id number] DOB: 10/06/1965 Today's Date: 09/15/2021  History of Present Illness  56 year old presenting with L sided weakness. MRI imaging of the brain reveals a right precentral gyrus/frontal lobe infarct. PMH: ESRD on hemodialysis Monday Wednesday Friday, hypertension, hyperlipidemia, prostate CA, anxiety, cirrhosis, COPD, First degree heart block, PNU.  Clinical Impression   Pt admitted with above diagnosis. Lives at home alone, in a single-level home with 1 step to enter; Pt is very independent, and used to needing to problem-solve; at baseline, he walks without an assistive device, manages basic ADLs independently, takes sink baths due to HD port; Presents to PT with L sided weakness which effects his abiltiy to safely walk and complete ADLs; Noting dec LLE step height and length with difficulty clearing the floor with LLE stepping; increased risk for falls; See also OT note for more detail re: LUE function; Needs mod assist to sit up to EOB, able to walk short in-room distance with min assist;  Pt currently with functional limitations due to the deficits listed below (see PT Problem List). Pt will benefit from skilled PT to increase their independence and safety with mobility to allow discharge to the venue listed below.    Mr. Silsby values his independence and will put forth the effort to return to independence.        Recommendations for follow up therapy are one component of a multi-disciplinary discharge planning process, led by the attending physician.  Recommendations may be updated based on patient status, additional functional criteria and insurance authorization.  Follow Up Recommendations Acute inpatient rehab (3hours/day)      Assistance Recommended at Discharge PRN  Patient can return home with the following       Equipment Recommendations Rolling walker (2 wheels);BSC/3in1 (RW wit hand splint)   Recommendations for Other Services       Functional Status Assessment Patient has had a recent decline in their functional status and demonstrates the ability to make significant improvements in function in a reasonable and predictable amount of time.     Precautions / Restrictions Precautions Precautions: Fall Restrictions Weight Bearing Restrictions: No      Mobility  Bed Mobility Overal bed mobility: Needs Assistance Bed Mobility: Supine to Sit     Supine to sit: Mod assist     General bed mobility comments: Able to move bil LEs to EOB, though slowly; Heavy mod assist to pull to sit and square off hips at edge of stretcher    Transfers Overall transfer level: Needs assistance Equipment used: 1 person hand held assist (and R UE stabilizing on bed side chest, locked) Transfers: Sit to/from Stand Sit to Stand: Mod assist, Min assist           General transfer comment: Mod assist to steady and close guard/obs of L knee at initial stand; Dependent on RUE to stabilize; L knee weak, but did not buckle; Min assist, mostly steadying with 2 more reps of sit to stand    Ambulation/Gait Ambulation/Gait assistance: Min assist Gait Distance (Feet): 12 Feet Assistive device: 1 person hand held assist Gait Pattern/deviations: Decreased stance time - left, Decreased dorsiflexion - left       General Gait Details: Walked around stretcher with Handheld assist/support LUE; LLE with some difficulty clearing floor and advancing, leading to decr step length  Stairs            Wheelchair Mobility    Modified Rankin (Stroke Patients Only)  Balance Overall balance assessment: Needs assistance   Sitting balance-Leahy Scale: Fair       Standing balance-Leahy Scale: Poor                               Pertinent Vitals/Pain Pain Assessment Pain Intervention(s): Monitored during session    Home Living Family/patient expects to be discharged to::  Private residence Living Arrangements: Alone Available Help at Discharge: Family;Friend(s) Type of Home: House Home Access: Stairs to enter Entrance Stairs-Rails: Right Entrance Stairs-Number of Steps: 1   Home Layout: One level Home Equipment: Cane - single point      Prior Function Prior Level of Function : Independent/Modified Independent (has license)             Mobility Comments: sister or friends take him to the grocery store; takes SCAT to HD ADLs Comments: takes sink baths     Hand Dominance   Dominant Hand: Right    Extremity/Trunk Assessment   Upper Extremity Assessment Upper Extremity Assessment: Defer to OT evaluation    Lower Extremity Assessment Lower Extremity Assessment: LLE deficits/detail LLE Deficits / Details: Hip flexion 2/5, knee extension 3+/5, knee flexion 3/5; ankle dorsiflexion 3/5; Difficulty with hip flexion against gravity, better in gravity eliminated LLE Coordination: decreased gross motor    Cervical / Trunk Assessment Cervical / Trunk Assessment: Kyphotic  Communication   Communication: Expressive difficulties ("slurring my words")  Cognition Arousal/Alertness: Awake/alert Behavior During Therapy: WFL for tasks assessed/performed (at times tearful) Overall Cognitive Status: Impaired/Different from baseline Area of Impairment: Attention, Safety/judgement, Awareness, Problem solving                         Safety/Judgement: Decreased awareness of safety, Decreased awareness of deficits Awareness: Emergent            General Comments General comments (skin integrity, edema, etc.): Sister present and helpful; Pt is very independent and appreciates being given choices    Exercises     Assessment/Plan    PT Assessment Patient needs continued PT services  PT Problem List Decreased strength;Decreased range of motion;Decreased activity tolerance;Decreased balance;Decreased mobility;Decreased coordination;Decreased  cognition;Decreased knowledge of use of DME;Decreased safety awareness;Decreased knowledge of precautions;Impaired sensation       PT Treatment Interventions DME instruction;Gait training;Stair training;Functional mobility training;Therapeutic activities;Therapeutic exercise;Balance training;Cognitive remediation;Neuromuscular re-education;Patient/family education    PT Goals (Current goals can be found in the Care Plan section)  Acute Rehab PT Goals Patient Stated Goal: Wants to be able to be independent PT Goal Formulation: With patient Time For Goal Achievement: 09/29/21 Potential to Achieve Goals: Good    Frequency Min 4X/week     Co-evaluation               AM-PAC PT "6 Clicks" Mobility  Outcome Measure Help needed turning from your back to your side while in a flat bed without using bedrails?: A Little Help needed moving from lying on your back to sitting on the side of a flat bed without using bedrails?: A Little Help needed moving to and from a bed to a chair (including a wheelchair)?: A Little Help needed standing up from a chair using your arms (e.g., wheelchair or bedside chair)?: A Little Help needed to walk in hospital room?: A Lot Help needed climbing 3-5 steps with a railing? : Total 6 Click Score: 15    End of Session Equipment Utilized During Treatment: Gait  belt Activity Tolerance: Patient tolerated treatment well Patient left: in chair;with call bell/phone within reach;with family/visitor present Nurse Communication: Mobility status PT Visit Diagnosis: Unsteadiness on feet (R26.81);Other abnormalities of gait and mobility (R26.89);Other symptoms and signs involving the nervous system (R29.898);Hemiplegia and hemiparesis Hemiplegia - Right/Left: Left Hemiplegia - dominant/non-dominant: Non-dominant Hemiplegia - caused by: Cerebral infarction    Time: 8206-0156 PT Time Calculation (min) (ACUTE ONLY): 50 min   Charges:   PT Evaluation $PT Eval  Moderate Complexity: 1 Mod PT Treatments $Gait Training: 8-22 mins $Therapeutic Activity: 8-22 mins        Roney Marion, PT  Acute Rehabilitation Services Office [place] 09/15/2021, 1:32 PM

## 2021-09-15 NOTE — Progress Notes (Addendum)
Inpatient Rehab Admissions Coordinator Note:   Per PT patient was screened for CIR candidacy by Kumari Sculley Danford Bad, CCC-SLP. Note pt is under observation status at this time. Pt may not warrant an inpatient rehab stay if they remain observation. If status were to change to  inpatient, CIR admissions team will screen for candidacy.  ADDENDUM 1610: Pt status has been changed to inpatient. Pt appears to be an appropriate candidate for CIR. If pt would like to be considered, please place an IP Rehab MD consult order.   Gayland Curry, [place], [place] Admissions Coordinator [phone] 09/15/21 4:01 PM

## 2021-09-15 NOTE — Progress Notes (Signed)
  Echocardiogram 2D Echocardiogram has been performed.  James Wall 09/15/2021, 1:40 PM

## 2021-09-15 NOTE — Evaluation (Signed)
Speech Language Pathology Evaluation Patient Details Name: James Wall MRN: [id number] DOB: Dec 24, 1965 Today's Date: 09/15/2021 Time: 1125-1140 SLP Time Calculation (min) (ACUTE ONLY): 15 min  Problem List:  Patient Active Problem List   Diagnosis Date Noted   Stroke ([place]) 09/14/2021   Malignant neoplasm of prostate ([place]) 03/21/2021   Seborrheic dermatitis 03/19/2021   Nonischemic cardiomyopathy ([place]) 11/01/2020   Hypertensive heart disease with heart failure ([place]) 11/01/2020   Microcytic anemia 11/01/2020   Heart failure with reduced ejection fraction ([place]) 08/28/2020   Chronic obstructive pulmonary disease ([place]) 08/28/2020   Tobacco abuse 08/28/2020   CAP (community acquired pneumonia) 08/09/2020   Dyspnea on exertion 09/22/2019   Hypercalcemia 09/14/2019   Tubular adenoma of colon 12/13/2018   Chronic vomiting 07/11/2018   Diarrhea, unspecified 04/16/2018   Acute pulmonary edema (HCC)    Situational insomnia 09/24/2017   Generalized anxiety disorder 09/17/2017   Bloodstream infection due to central venous catheter, initial encounter 09/16/2017   BPH (benign prostatic hyperplasia) 09/12/2017   Bacteremia associated with intravascular line ([place]) 09/11/2017   Type 2 diabetes mellitus with diabetic peripheral angiopathy without gangrene ([place]) 05/25/2017   Cirrhosis ([place])    Hypertensive urgency 79/04/8331   Complication of AV dialysis fistula 10/22/2015   ESRD on dialysis ([place]) 10/22/2015   Pruritus, unspecified 07/20/2015   Dependence on renal dialysis ([place]) 11/28/2014   Coagulation defect, unspecified ([place]) 02/01/2014   Aftercare including intermittent dialysis ([place]) 01/05/2014   Mild protein-calorie malnutrition ([place]) 08/03/2013   Abnormal cardiovascular function study 07/12/2013   Abnormal stress test 06/22/2013   Hypothyroidism, unspecified 06/03/2013   GERD (gastroesophageal reflux disease) 12/21/2012   Anemia of renal disease 12/21/2012   Essential  hypertension 04/06/2008   ASTHMA 04/06/2008   SLEEP APNEA 04/06/2008   Secondary hyperparathyroidism of renal origin ([place]) 03/05/2007   Patient's noncompliance with other medical treatment and regimen 03/05/2007   Personal history of nicotine dependence 03/05/2007   Hypertensive chronic kidney disease with 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 ([place]) 02/18/2007   Past Medical History:  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 ([place])    primary cancer is prostate   Anemia associated with chronic renal failure    Anxiety    Arthritis    Cirrhosis, nonalcoholic ([place])    per pt possible from a medication , unsure ;   last ultrasound 08-09-2020 in epic no fibrosis   COPD (chronic obstructive pulmonary disease) ([place])    Depression    ESRD on hemodialysis ([place]) 2009   Started HD Jan 2009;  ESRD secondary to hypertensive nephrosclerosis;  dialysis at [facility] on MWF   First degree heart block    GERD (gastroesophageal reflux disease)    Hiatal hernia    History of acute respiratory failure 07/2013   admission;  HCAP w/ ARF with hypoxia   History of adenomatous polyp of colon    History of ascites    s/p paracentesis 01-31-2013 (5L) and last one 03-28-2013 (2.7L)   History of community acquired pneumonia 08/08/2020   admission ; LLL , POA   History of heart murmur in childhood    History of MRSA infection 12/19/2012   hospital admission;  w/ sepsis MRSA bacteramia AVF infection   History of sepsis 03/2017   admission;   HCAP w/ sepsis   Hyperlipidemia    Hypertension    Hypertensive heart disease  cardiologist--- dr Jerilynn Mages. Gasper Sells;  nuclear stress test 06-16-2013 intermediate risk w/ mid-distal anterior wall ischemia , not gated;  cardiac cath 07-13-2013 in epic showed normal coronaries and LVSF,  ef not assessed, LCEDP 73mHg   Hypertensive nephrosclerosis, stage 5  chronic kidney disease or end stage renal disease ([place]    Intolerance to cold    due to anemia   Malignant neoplasm prostate ([facility]    urologist--- dr bell/  radiation onologist--- dr mTammi Klippel  dx 01/ 2023,  Gleason 4+3, PSA 32   NICM (nonischemic cardiomyopathy) ([place]    followed by cardiology;   last echo in epic 09-13-2020 ef 55-60%   OSA (obstructive sleep apnea) 2009   no  longer using cpap since the yr started 2009;   sleep study in epic 05-11-2007 severe osa   PSVT (paroxysmal supraventricular tachycardia) ([place]    event monitor 08-01-2019  SR w/ SVT runs , rare PAC/ PVC   Secondary hyperparathyroidism ([place]    Wears glasses    Past Surgical History:  Past Surgical History:  Procedure Laterality Date   AV FISTULA PLACEMENT Right 02/22/2013   Procedure:  CREATION  OF BRACHIAL CEPHALIC FISTULA RIGHT ARM;  Surgeon: CElam Dutch MD;  [facility] [place]  Service: Vascular;  Laterality: Right;   AV FISTULA PLACEMENT Left 08/10/2014   Procedure: BASILIC VEIN TRANSPOSITION  ARTERIOVENOUS (AV) FISTULA CREATION LEFT UPPER ARM;  Surgeon: JMal Misty MD;  [place]: [place]  Service: Vascular;  Laterality: Left;   AV FISTULA PLACEMENT, RADIOCEPHALIC Left 085/46/2703  @[facility] ;  Left lower arm   COLONOSCOPY  11/30/2018   by dr bTarri Glenn  ESOPHAGOGASTRODUODENOSCOPY (EGD) WITH PROPOFOL N/A 04/12/2013   Procedure: ESOPHAGOGASTRODUODENOSCOPY (EGD) WITH PROPOFOL;  Surgeon: WArta Silence MD;  [facility];  Service: Endoscopy;  Laterality: N/A;   GOLD SEED IMPLANT N/A 06/11/2021   Procedure: GOLD SEED IMPLANT;  Surgeon: GJanith Lima MD;  [facility];  Service: Urology;  Laterality: N/A;   INSERTION OF DIALYSIS CATHETER N/A 12/23/2012   Procedure: INSERTION OF DIALYSIS CATHETER; ULTRASOUND GUIDED;  Surgeon: CAngelia Mould MD;  [place]  Service: Vascular;  Laterality: N/A;   INSERTION OF DIALYSIS CATHETER  10/22/2015   Right IJ non-tunneled HD catheter, placed  again in 2019   IR FLUORO GUIDE CV LINE RIGHT  08/18/2019   IR FLUORO GUIDE CV LINE RIGHT  10/07/2019   IR GENERIC HISTORICAL  10/22/2015   IR [place] VASC ACCESS RIGHT 10/22/2015 [facility]   IR GENERIC HISTORICAL  10/22/2015   IR FLUORO GUIDE CV LINE RIGHT 10/22/2015 [facility]   IR GENERIC HISTORICAL  10/23/2015   IR FLUORO GUIDE CV LINE RIGHT 10/23/2015 AMarybelle Killings MD [facility]   LEFT HEART CATHETERIZATION WITH CORONARY ANGIOGRAM N/A 07/13/2013   Procedure: LEFT HEART CATHETERIZATION WITH CORONARY ANGIOGRAM;  Surgeon: JJettie Booze MD;  [facility];  Service: Cardiovascular;  Laterality: N/A;   LIGATION OF ARTERIOVENOUS  FISTULA Left 12/22/2012   Procedure: LIGATION OF ARTERIOVENOUS  FISTULA;EXCISION OF LARGE ANEURYSMS;;  Surgeon: CElam Dutch MD;  [place]  Service: Vascular;  Laterality: Left;   SPACE OAR INSTILLATION N/A 06/11/2021   Procedure: SPACE OAR INSTILLATION;  Surgeon: GJanith Lima MD;  [facility];  Service: Urology;  Laterality: N/A;   UPPER GASTROINTESTINAL ENDOSCOPY  09/07/2018   by dr bTarri Glenn  HPI:  Patient is a 56y.o. male with PMH: COPD, ESRD on HD,  prostate cancer s/p radiation, chronic anemia, anxiety, HTN. He presented to the hospital on 09/14/21 from home with left sided upper and lower extremity weakness for past three days. In ED, lab workup showed elevated creatinine to 7.5 and hemoglobin 6.8. MRI revealed acute right nonhemorrhagic infarct involving the precentral gyrus extending into the corona radiata and more anterior frontal lobe.   Assessment / Plan / Recommendation Clinical Impression  Patient is currently presenting with a mild dysarthria and questionable higher level cognitive impairment. SLP was able to understand his speech without difficulty at conversational level and dysarthria primarily impacting the quality of his speech but not significantly impacting overall intelligibility. He exhibits good lingual  ROM with only mild lingual weakness and although left side facial ROM is mild-moderately impaired, it is only when patient instructed to smile widely that this weakness is fully seen. During conversation, mild right said facial and labial weakness is present. Patient's higher level/complex level cognition was not assessed today as patient has had some difficulty coping with his physical changes from the CVA and has had numerous medical staff and therapy staff evaluating him which seems to be somewhat overwhelming to him. SLP let patient know that he does not require acute care level SLP intervention but he could benefit from evaluation by SLP at next venue of care ([facility]).    SLP Assessment  SLP Recommendation/Assessment: All further Speech Lanaguage Pathology  needs can be addressed in the next venue of care SLP Visit Diagnosis: Dysarthria and anarthria (R47.1);Cognitive communication deficit (R41.841)    Recommendations for follow up therapy are one component of a multi-disciplinary discharge planning process, led by the attending physician.  Recommendations may be updated based on patient status, additional functional criteria and insurance authorization.    Follow Up Recommendations  Acute inpatient rehab (3hours/day)    Assistance Recommended at Discharge  Frequent or constant Supervision/Assistance  Functional Status Assessment Patient has had a recent decline in their functional status and demonstrates the ability to make significant improvements in function in a reasonable and predictable amount of time.  Frequency and Duration     N/A      SLP Evaluation Cognition  Overall Cognitive Status: Difficult to assess Arousal/Alertness: Awake/alert Orientation Level: Oriented X4 Memory: Appears intact Awareness: Appears intact Problem Solving: Appears intact Safety/Judgment: Appears intact       Comprehension  Auditory Comprehension Overall Auditory Comprehension: Appears within  functional limits for tasks assessed    Expression Expression Primary Mode of Expression: Verbal Verbal Expression Overall Verbal Expression: Appears within functional limits for tasks assessed   Oral / Motor  Oral Motor/Sensory Function Overall Oral Motor/Sensory Function: Mild impairment Facial ROM: Reduced left Facial Symmetry: Abnormal symmetry left Facial Strength: Reduced left Lingual Symmetry: Within Functional Limits Lingual Strength: Reduced Lingual Sensation: Within Functional Limits Velum: Within Functional Limits Mandible: Within Functional Limits Motor Speech Overall Motor Speech: Impaired Respiration: Within functional limits Phonation: Normal Resonance: Within functional limits Articulation: Impaired Level of Impairment: Conversation Intelligibility: Intelligibility reduced Word: 75-100% accurate Phrase: 75-100% accurate Sentence: 75-100% accurate Conversation: 75-100% accurate Motor Planning: Witnin functional limits Motor Speech Errors: Not applicable Effective Techniques: Slow rate            Sonia Baller, MA, CCC-SLP Speech Therapy

## 2021-09-15 NOTE — Evaluation (Signed)
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 [facility])    primary cancer is prostate   Anemia associated with chronic renal failure    Anxiety    Arthritis    Cirrhosis, nonalcoholic ([place])    per pt possible from a medication , unsure ;   last ultrasound 08-09-2020 in epic no fibrosis   COPD (chronic obstructive pulmonary disease) ([place])    Depression    ESRD on hemodialysis ([place]) 2009   Started HD Jan 2009;  ESRD secondary to hypertensive nephrosclerosis;  dialysis at [facility] on MWF   First degree heart block    GERD (gastroesophageal reflux disease)    Hiatal hernia    History of acute respiratory failure 07/2013   admission;  HCAP w/ ARF with hypoxia   History of adenomatous polyp of colon    History of ascites    s/p paracentesis 01-31-2013 (5L) and last one 03-28-2013 (2.7L)   History of community acquired pneumonia 08/08/2020   admission ; LLL , POA   History of heart murmur in childhood    History of MRSA infection 12/19/2012   hospital admission;  w/ sepsis MRSA bacteramia AVF infection   History of sepsis 03/2017   admission;   HCAP w/ sepsis   Hyperlipidemia    Hypertension    Hypertensive heart disease    cardiologist--- dr Osborne Oman;  nuclear stress test 06-16-2013 intermediate risk w/ mid-distal anterior wall ischemia , not gated;  cardiac cath 07-13-2013 in epic showed normal coronaries and LVSF,  ef not assessed, LCEDP 74mHg   Hypertensive nephrosclerosis, 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 ([place]    Intolerance to cold    due to anemia   Malignant neoplasm  prostate ([facility]    urologist--- dr bell/  radiation onologist--- dr mTammi Klippel  dx 01/ 2023,  Gleason 4+3, PSA 369  NICM (nonischemic cardiomyopathy) ([place]    followed by cardiology;   last echo in epic 09-13-2020 ef 55-60%   OSA (obstructive sleep apnea) 2009   no  longer using cpap since the yr started 2009;   sleep study in epic 05-11-2007 severe osa   PSVT (paroxysmal supraventricular tachycardia) ([place]    event monitor 08-01-2019  SR w/ SVT runs , rare PAC/ PVC   Secondary hyperparathyroidism ([place]    Wears glasses    Past Surgical History:  Past Surgical History:  Procedure Laterality Date   AV FISTULA PLACEMENT Right 02/22/2013   Procedure:  CREATION  OF BRACHIAL CEPHALIC FISTULA RIGHT ARM;  Surgeon: CElam Dutch MD;  [place]  Service: Vascular;  Laterality: Right;   AV FISTULA PLACEMENT Left 08/10/2014   Procedure: BASILIC VEIN TRANSPOSITION  ARTERIOVENOUS (AV) FISTULA CREATION LEFT UPPER ARM;  Surgeon: JMal Misty MD;  [place]: [facility]  Service: Vascular;  Laterality: Left;   AV FISTULA PLACEMENT, RADIOCEPHALIC Left 016/38/4536  @[facility] ;  Left lower arm   COLONOSCOPY  11/30/2018   by dr bTarri Glenn  ESOPHAGOGASTRODUODENOSCOPY (EGD) WITH PROPOFOL N/A 04/12/2013   Procedure: ESOPHAGOGASTRODUODENOSCOPY (EGD) WITH PROPOFOL;  Surgeon: WArta Silence MD;  [facility];  Service: Endoscopy;  Laterality: N/A;   GOLD SEED IMPLANT N/A 06/11/2021   Procedure: GOLD SEED IMPLANT;  Surgeon: Janith Lima, MD;  [facility];  Service: Urology;  Laterality: N/A;   INSERTION OF DIALYSIS CATHETER N/A 12/23/2012   Procedure: INSERTION OF DIALYSIS CATHETER; ULTRASOUND GUIDED;  Surgeon: Angelia Mould, MD;  Location: [place];  Service: Vascular;  Laterality: N/A;   INSERTION OF DIALYSIS CATHETER  10/22/2015   Right IJ non-tunneled HD catheter, placed again in 2019   IR FLUORO GUIDE CV LINE RIGHT  08/18/2019   IR FLUORO GUIDE CV LINE RIGHT  10/07/2019   IR GENERIC  HISTORICAL  10/22/2015   IR US GUIDE VASC ACCESS RIGHT 10/22/2015 [facility]   IR GENERIC HISTORICAL  10/22/2015   IR FLUORO GUIDE CV LINE RIGHT 10/22/2015 [facility]   IR GENERIC HISTORICAL  10/23/2015   IR FLUORO GUIDE CV LINE RIGHT 10/23/2015 Marybelle Killings, MD [facility]   LEFT HEART CATHETERIZATION WITH CORONARY ANGIOGRAM N/A 07/13/2013   Procedure: LEFT HEART CATHETERIZATION WITH CORONARY ANGIOGRAM;  Surgeon: Jettie Booze, MD;  Location: [facility] CATH LAB;  Service: Cardiovascular;  Laterality: N/A;   LIGATION OF ARTERIOVENOUS  FISTULA Left 12/22/2012   Procedure: LIGATION OF ARTERIOVENOUS  FISTULA;EXCISION OF LARGE ANEURYSMS;;  Surgeon: Elam Dutch, MD;  Location: [place];  Service: Vascular;  Laterality: Left;   SPACE OAR INSTILLATION N/A 06/11/2021   Procedure: SPACE OAR INSTILLATION;  Surgeon: Janith Lima, MD;  [facility];  Service: Urology;  Laterality: N/A;   UPPER GASTROINTESTINAL ENDOSCOPY  09/07/2018   by dr Tarri Glenn   HPI:  Patient is a 56 y.o. male with PMH: COPD, ESRD on HD, prostate cancer s/p radiation, chronic anemia, anxiety, HTN. He presented to the hospital on 09/14/21 from home with left sided upper and lower extremity weakness for past three days. In ED, lab workup showed elevated creatinine to 7.5 and hemoglobin 6.8. MRI revealed acute right nonhemorrhagic infarct involving the precentral gyrus extending into the corona radiata and more anterior frontal lobe.    Assessment / Plan / Recommendation  Clinical Impression  Patient is not currently presenting with clinical s/s of dysphagia as per this bedside/clinical swallow evaluation. He consumed consecutive straw sips of water without overt s/s aspiration or penetration and with timely swallow initiation. He did report to SLP that he has a h/o "acid reflux" and that he is mindful of foods that trigger his GERD, telling SLP that tomato sauces, etc are especially problematic. He told SLP that he has had times  where his reflux was so bad that he "throw up everything I ate or drank" and that this resulted in him losing a significant amount of weight approximately in 2006 and ever since that time he has tried to maintain that weight loss by eating one meal a day (his personal goal weight is 175lbs). Patient also told SLP that he chipped a tooth on left side eating some chips and this has made it difficult to chew on left. SLP is not recommending further skilled intervention at this time but if patient exhibiting any coughing, throat clearing or difficulty with mastication of PO's, please feel free to re-consult with SLP. SLP Visit Diagnosis: Dysphagia, unspecified (R13.10)    Aspiration Risk  No limitations;Mild aspiration risk    Diet Recommendation Regular;Thin liquid   Liquid Administration via: Cup;Straw Medication Administration: Whole meds with liquid Supervision: Patient able to self feed Compensations: Slow rate;Small sips/bites Postural Changes: Remain upright for at  least 30 minutes after po intake;Seated upright at 90 degrees    Other  Recommendations Oral Care Recommendations: Oral care BID    Recommendations for follow up therapy are one component of a multi-disciplinary discharge planning process, led by the attending physician.  Recommendations may be updated based on patient status, additional functional criteria and insurance authorization.  Follow up Recommendations No SLP follow up      Assistance Recommended at Discharge Intermittent Supervision/Assistance  Functional Status Assessment Patient has had a recent decline in their functional status and demonstrates the ability to make significant improvements in function in a reasonable and predictable amount of time.  Frequency and Duration     N/A       Prognosis   N/A     Swallow Study   General Date of Onset: 09/14/21 HPI: Patient is a 56 y.o. male with PMH: COPD, ESRD on HD, prostate cancer s/p radiation, chronic  anemia, anxiety, HTN. He presented to the hospital on 09/14/21 from home with left sided upper and lower extremity weakness for past three days. In ED, lab workup showed elevated creatinine to 7.5 and hemoglobin 6.8. MRI revealed acute right nonhemorrhagic infarct involving the precentral gyrus extending into the corona radiata and more anterior frontal lobe. Type of Study: Bedside Swallow Evaluation Previous Swallow Assessment: none found Diet Prior to this Study: Regular;Thin liquids Temperature Spikes Noted: No Respiratory Status: Room air History of Recent Intubation: No Behavior/Cognition: Alert;Cooperative;Pleasant mood Oral Cavity Assessment: Within Functional Limits Oral Care Completed by SLP: Recent completion by staff Oral Cavity - Dentition: Missing dentition;Adequate natural dentition Vision: Functional for self-feeding Self-Feeding Abilities: Able to feed self Patient Positioning: Upright in chair Baseline Vocal Quality: Normal Volitional Cough: Strong Volitional Swallow: Able to elicit    Oral/Motor/Sensory Function Overall Oral Motor/Sensory Function: Mild impairment Facial ROM: Reduced left Facial Symmetry: Abnormal symmetry left Facial Strength: Reduced left Lingual Symmetry: Within Functional Limits Lingual Strength: Reduced Lingual Sensation: Within Functional Limits Velum: Within Functional Limits Mandible: Within Functional Limits   Ice Chips     Thin Liquid Thin Liquid: Within functional limits Presentation: Straw;Self Fed    Nectar Thick     Honey Thick     Puree Puree: Not tested   Solid     Solid: Not tested     Sonia Baller, MA, CCC-SLP Speech Therapy

## 2021-09-15 NOTE — Progress Notes (Signed)
Daily Progress Note Intern Pager: [phone]  Patient name: James Wall [facility] Medical record number: [id number] Date of birth: Dec 25, 1965 Age: 56 y.o. Gender: male  Primary Care Provider: Wells Guiles, DO Consultants: Neurology Code Status: Full  Pt Overview and Major Events to Date:  8/12: admitted  Assessment and Plan: Richard Ritchey is a 56 y.o. male admitted for stroke causing left -sided weakness who presented outside of the acute intervention window.  Pertinent PMH/PSH includes ESRD on HD, anemia of renal disease, prostate cancer s/p radiation, HTN.  * Stroke [facility]) Unfortunately, out of window for acute intervention. MRI shows acute nonhemorrhagic infarct involving left precentral gyrus extending into corona radiata and more anterior frontal lobe in addition to remote lacunar infarct of left thalamus. -Neurology following, appreciate recommendations, patient started on Plavix -Neurochecks every 4 hours, vital signs per unit, continuous cardiac monitoring x24 hours -Echocardiogram -SLP, PT, OT -Not requiring permissive hypertensive measures  ESRD on dialysis [facility]) Dialysis MWF, went to dialysis on Friday.  Anuric with exception of few drops of urine.  Home medications include Renvela 3 times daily.  Electrolytes stable, no signs of uremia. Not requiring emergent dialysis at this time. -Continue Renvela -Nephrology consulted, recs appreciated  Anemia of renal disease Chronic anemia with macrocytic component.  No signs of blood loss or symptoms of anemia. Not on EPO agents according to medication list, likely relating to most recently being treated with radiation. Hgb remains unchanged at 6.8. -Monitor CBC -Nephrology recommended 2u pRBC with dialysis tomorrow -Follow-up H&H  Essential hypertension Hypertensive up to 163/95.  Medication reconciliation information difficult to obtain during admission.  Potential home medications include amlodipine 10 mg daily,  clonidine 0.3 mg patch weekly, hydralazine 25 mg twice daily, Isordil 20 mg twice daily, metoprolol tartrate 100 mg to take after dialysis. -Continue amlodipine 10 mg daily, clonidine 0.3 mg weekly, hydralazine 25 mg twice daily, metoprolol tartrate 100 mg twice daily, Isordil 20 mg daily   Chronic, stable conditions: Prostate cancer s/p radiation COPD  FEN/GI: Renal/carb modified PPx: SCDs Dispo:Pending PT recommendations  . Barriers include further medical work-up and specialist recommendations.   Subjective:  Patient states he had some difficulty sleeping overnight.  He is aware that his friend GG and family numbers may come visit today and is thankful for medical care received thus far.  Objective: Temp:  [98 F (36.7 C)-98.4 F (36.9 C)] 98 F (36.7 C) (08/13 1426) Pulse Rate:  [46-105] 56 (08/13 1425) Resp:  [9-26] 19 (08/13 1425) BP: (128-187)/(73-103) 183/100 (08/13 1425) SpO2:  [90 %-100 %] 94 % (08/13 1425) Physical Exam: General: Awake, alert, NAD Cardiovascular: RRR, no murmurs auscultated Respiratory: CTA B, normal WOB Neuro:  Laboratory: Most recent CBC Lab Results  Component Value Date   WBC 2.7 (L) 09/15/2021   HGB 6.8 (LL) 09/15/2021   HCT 21.3 (L) 09/15/2021   MCV 105.4 (H) 09/15/2021   PLT 135 (L) 09/15/2021   Most recent BMP    Latest Ref Rng & Units 09/15/2021    3:29 AM  BMP  Glucose 70 - 99 mg/dL 95   BUN 6 - 20 mg/dL 25   Creatinine 0.61 - 1.24 mg/dL 8.86   Sodium 135 - 145 mmol/L 138   Potassium 3.5 - 5.1 mmol/L 4.0   Chloride 98 - 111 mmol/L 101   CO2 22 - 32 mmol/L 26   Calcium 8.9 - 10.3 mg/dL 9.8    Lipid Panel     Component Value Date/Time  CHOL 126 09/14/2021 2138   TRIG 62 09/14/2021 2138   HDL 59 09/14/2021 2138   CHOLHDL 2.1 09/14/2021 2138   VLDL 12 09/14/2021 2138   LDLCALC 55 09/14/2021 2138    Mg: 2.1 (nl) TSH: 3.9 (nl)  Imaging/Diagnostic Tests: CTA head/neck: Negative CTA for large vessel occlusion or other  emergent Finding. Bulky calcified plaque about the right carotid bulb/proximal right ICA with associated stenosis of up to 40%. Atheromatous changes. Extensive plaque about the proximal right vertebral artery with associated severe stenosis at the pre foraminal right V1 segment. Layering bilateral pleural effusions with pulmonary interstitial edema, suggesting CHF. Prominent adenopathy within the lower left neck and visualized mediastinum. Nonspecific, could be reactive. Possible nodal metastatic disease or lymphoproliferative disorder.  Wells Guiles, DO 09/15/2021, 3:59 PM  PGY-2, [place] Intern pager: [phone], text pages welcome Secure chat group [place]

## 2021-09-15 NOTE — ED Notes (Signed)
RN gave pt his chain purse and applied blankets

## 2021-09-15 NOTE — Evaluation (Signed)
Occupational Therapy Evaluation Patient Details Name: James Wall MRN: [id number] DOB: 1965/07/19 Today's Date: 09/15/2021   History of Present Illness 56 year old presenting with L sided weakness. MRI imaging of the brain reveals a right precentral gyrus/frontal lobe infarct. PMH: ESRD on hemodialysis Monday Wednesday Friday, hypertension, hyperlipidemia, prostate CA, anxiety, cirrhosis, COPD, First degree heart block, PNU.   Clinical Impression   PTA pt lives independently at home alone and takes SCAT to HD MWF. Sister/friend help with errands "when they can". Pt demonstrates a significant decline in functional status due to deficits listed below. Feel pt would benefit from intensive rehab at [facility] to reach modified independent level goals as pt's goal is to return home. Acute OT to follow.      Recommendations for follow up therapy are one component of a multi-disciplinary discharge planning process, led by the attending physician.  Recommendations may be updated based on patient status, additional functional criteria and insurance authorization.   Follow Up Recommendations  Acute inpatient rehab (3hours/day)    Assistance Recommended at Discharge Frequent or constant Supervision/Assistance  Patient can return home with the following A lot of help with walking and/or transfers;A lot of help with bathing/dressing/bathroom;Assistance with cooking/housework;Direct supervision/assist for medications management;Direct supervision/assist for financial management;Assist for transportation;Help with stairs or ramp for entrance    Functional Status Assessment  Patient has had a recent decline in their functional status and demonstrates the ability to make significant improvements in function in a reasonable and predictable amount of time.  Equipment Recommendations  BSC/3in1    Recommendations for Other Services Rehab consult     Precautions / Restrictions Precautions Precautions: Fall       Mobility Bed Mobility Overal bed mobility: Needs Assistance             General bed mobility comments: attempted to move to EOB with max A; Pt unable to reposition himself upright in bed    Transfers                   General transfer comment: pt declined      Balance Overall balance assessment: Needs assistance   Sitting balance-Leahy Scale: Fair                                     ADL either performed or assessed with clinical judgement   ADL Overall ADL's : Needs assistance/impaired     Grooming: Minimal assistance   Upper Body Bathing: Minimal assistance   Lower Body Bathing: Moderate assistance;Bed level   Upper Body Dressing : Moderate assistance   Lower Body Dressing: Maximal assistance;Bed level               Functional mobility during ADLs:  (pt declined)       Vision Baseline Vision/History: 1 Wears glasses Patient Visual Report: No change from baseline Vision Assessment?: Vision impaired- to be further tested in functional context Additional Comments: decreaed visual attention L; will further assess     Perception Perception Comments: decreased attention L; continued cues to look L at arm/leg when testing, pt would continuously look to the R   Praxis      Pertinent Vitals/Pain Pain Assessment Pain Assessment: Faces Faces Pain Scale: Hurts a little bit Pain Location: butt from sitting on the stretcher Pain Descriptors / Indicators: Grimacing, Discomfort Pain Intervention(s): Limited activity within patient's tolerance     Hand Dominance Right  Extremity/Trunk Assessment Upper Extremity Assessment Upper Extremity Assessment: LUE deficits/detail LUE Deficits / Details: minimal movement into flxor pattern at shoulder and elbow; no movement noted wrist/hand; non-functional LUE Sensation: decreased light touch;decreased proprioception LUE Coordination: decreased fine motor;decreased gross motor   Lower  Extremity Assessment Lower Extremity Assessment: Defer to PT evaluation (unable to flex knee or lift leg form bed)   Cervical / Trunk Assessment Cervical / Trunk Assessment: Kyphotic;Other exceptions   Communication Communication Communication: Expressive difficulties ("slurring my words")   Cognition Arousal/Alertness: Awake/alert Behavior During Therapy: Flat affect, Agitated (easily agitated at times) Overall Cognitive Status: Impaired/Different from baseline Area of Impairment: Attention, Safety/judgement, Awareness, Problem solving                   Current Attention Level: Selective     Safety/Judgement: Decreased awareness of safety, Decreased awareness of deficits Awareness: Emergent Problem Solving: Slow processing General Comments: decreased awarneness of how his deficits affect him functionally; will further assess     General Comments  HD cath    Exercises     Shoulder Instructions      Home Living Family/patient expects to be discharged to:: Private residence Living Arrangements: Alone   Type of Home: House Home Access: Stairs to enter Technical brewer of Steps: 1 Entrance Stairs-Rails: Right Home Layout: One level     Bathroom Shower/Tub: Corporate investment banker: Standard Bathroom Accessibility: Yes How Accessible: Accessible via walker Home Equipment: Cane - single point          Prior Functioning/Environment Prior Level of Function : Independent/Modified Independent (has license)             Mobility Comments: sister or friends take him to the grocery store ADLs Comments: takes SCAT to HD        OT Problem List: Decreased strength;Decreased range of motion;Decreased activity tolerance;Impaired balance (sitting and/or standing);Impaired vision/perception;Decreased coordination;Decreased cognition;Decreased safety awareness;Decreased knowledge of use of DME or AE;Impaired sensation;Impaired  tone;Obesity;Impaired UE functional use;Pain      OT Treatment/Interventions: Self-care/ADL training;Therapeutic exercise;Neuromuscular education;DME and/or AE instruction;Therapeutic activities;Cognitive remediation/compensation;Visual/perceptual remediation/compensation;Patient/family education;Balance training    OT Goals(Current goals can be found in the care plan section) Acute Rehab OT Goals Patient Stated Goal: to go home OT Goal Formulation: With patient Time For Goal Achievement: 09/29/21 Potential to Achieve Goals: Good  OT Frequency: Min 2X/week    Co-evaluation              AM-PAC OT "6 Clicks" Daily Activity     Outcome Measure Help from another person eating meals?: A Little Help from another person taking care of personal grooming?: A Little Help from another person toileting, which includes using toliet, bedpan, or urinal?: A Lot Help from another person bathing (including washing, rinsing, drying)?: A Lot Help from another person to put on and taking off regular upper body clothing?: A Lot Help from another person to put on and taking off regular lower body clothing?: A Lot 6 Click Score: 14   End of Session Nurse Communication: Mobility status  Activity Tolerance: Patient tolerated treatment well Patient left: in bed;with call bell/phone within reach  OT Visit Diagnosis: Unsteadiness on feet (R26.81);Other abnormalities of gait and mobility (R26.89);Muscle weakness (generalized) (M62.81);Other symptoms and signs involving the nervous system (R29.898);Other symptoms and signs involving cognitive function;Hemiplegia and hemiparesis;Pain Hemiplegia - Right/Left: Left Hemiplegia - dominant/non-dominant: Non-Dominant Hemiplegia - caused by: Cerebral infarction Pain - part of body:  (butt/back)  Time: 8307-3543 OT Time Calculation (min): 26 min Charges:  OT General Charges $OT Visit: 1 Visit OT Evaluation $OT Eval Moderate Complexity: 1 Mod OT  Treatments $Self Care/Home Management : 8-22 mins  Maurie Boettcher, OT/L   Acute OT Clinical Specialist Acute Rehabilitation Services Pager [phone] Office [phone]   [facility] 09/15/2021, 9:22 AM

## 2021-09-15 NOTE — ED Notes (Signed)
Patient repositioned in bed for comfort.  Blankets repositioned.  No complaints voiced at this time

## 2021-09-15 NOTE — ED Notes (Signed)
Pt transported to [facility]. 

## 2021-09-15 NOTE — ED Notes (Signed)
CN notified IP CN for purple man x1

## 2021-09-15 NOTE — Hospital Course (Addendum)
James Wall is a 56 y.o. male who was admitted for acute non-hemorrhagic infarct who presented outside of the acute intervention window. Pertinent PMH/PSH includes ESRD on HD, anemia of renal disease, prostate cancer s/p radiation, HTN.   Stroke Left-sided weakness beginning 3 days prior to presentation to ED.  Presented out of acute intervention window.  MRI brain showed acute nonhemorrhagic infarct involving left precentral gyrus extending into corona radiata and more anterior frontal lobe in addition to remote lacunar infarct of left thalamus.  Started on clopidogrel monotherapy for stroke prophylaxis given his anemia of chronic disease (hgb baseline ~7). Neurology recommended 30-day heart monitor s/p discharge from hospital. PT/OT worked with patient and recommended CIR. He was discharged to CIR on 8/14.  Anemia of renal disease Chronic, presented with hemoglobin 6.8. Initially planned for blood transfusion, but he was asymptomatic and Hgb the following day was improved to 7.7 His baseline Hgb is around 7. He is not a candidate for EPO due to his history of prostate cancer. He receives iron infusions outpatient.   ESRD Chronic, stable. Receives dialysis MWF. Anuric except with few drops urine. On home sevelamer carbonate (Renvela), continued while admitted. Stable electrolytes, no signs of uremia. Did not require emergent dialysis. Received routine HD while admitted.   Essential hypertension Chronic, on multiple home medications. Placed on home amlodipine, clonidine, hydralazine, metoprolol tartrate, Isordil.   PCP Follow-up Recommendations: On discharge please call [place] to arrange 30-day monitor for the patient. F/u medication list for HTN Continue Plavix monotherapy for stroke ppx Consider recheck CBC F/u patient adherence to MWF dialysis regimen Should follow up with Neurology in 4-6 weeks.

## 2021-09-15 NOTE — ED Notes (Signed)
Pt HR is dropping in to the low 40's and apnea is presenting on the monitor while resting. RN paged provider.

## 2021-09-15 NOTE — Consult Note (Addendum)
Renal Service Consult Note [facility]  James Wall [facility] 09/15/2021 [name], MD Requesting Physician: Dr. Verlon Au  Reason for Consult: ESRD pt w/ subacute CVA HPI: The patient is a 56 y.o. year-old w/ hx of anemia, cirrhosis, COPD, depression, ESRD on HD, PNA, hx HTN, prostate cancer, NICM presented to ED left-sided weakness. In ED MRI showed L MCA CVA. Out of window for intervention acutely. Pt admitted. We are asked to see for dialysis.   Last HD was Friday (2d ago). Takes HD MWF.  Little UOP. No sob, leg swelling or CP. Takes renvela ac as binder.   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 [facility])    primary cancer is prostate   Anemia associated with chronic renal failure    Anxiety    Arthritis    Cirrhosis, nonalcoholic ([place])    per pt possible from a medication , unsure ;   last ultrasound 08-09-2020 in epic no fibrosis   COPD (chronic obstructive pulmonary disease) ([place])    Depression    ESRD on hemodialysis ([place]) 2009   Started HD Jan 2009;  ESRD secondary to hypertensive nephrosclerosis;  dialysis at [facility] on MWF   First degree heart block    GERD (gastroesophageal reflux disease)    Hiatal hernia    History of acute respiratory failure 07/2013   admission;  HCAP w/ ARF with hypoxia   History of adenomatous polyp of colon    History of ascites    s/p paracentesis 01-31-2013 (5L) and last one 03-28-2013 (2.7L)   History of community acquired pneumonia 08/08/2020   admission ; LLL , POA   History of heart murmur in childhood    History of MRSA infection 12/19/2012   hospital admission;  w/ sepsis MRSA bacteramia AVF infection   History of sepsis 03/2017   admission;   HCAP w/ sepsis   Hyperlipidemia    Hypertension     Hypertensive heart disease    cardiologist--- dr Osborne Oman;  nuclear stress test 06-16-2013 intermediate risk w/ mid-distal anterior wall ischemia , not gated;  cardiac cath 07-13-2013 in epic showed normal coronaries and LVSF,  ef not assessed, LCEDP 7mHg   Hypertensive nephrosclerosis, 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 ([place]    Intolerance to cold    due to anemia   Malignant neoplasm prostate ([facility]    urologist--- dr bell/  radiation onologist--- dr mTammi Klippel  dx 01/ 2023,  Gleason 4+3, PSA 355  NICM (nonischemic cardiomyopathy) ([place]    followed by cardiology;   last echo in epic 09-13-2020 ef 55-60%   OSA (obstructive sleep apnea) 2009   no  longer using cpap since the yr started 2009;   sleep study in epic 05-11-2007 severe osa   PSVT (paroxysmal supraventricular tachycardia) ([place]    event monitor 08-01-2019  SR w/ SVT runs , rare PAC/ PVC   Secondary hyperparathyroidism ([place]    Wears glasses    Past Surgical History  Past Surgical History:  Procedure Laterality Date   AV FISTULA PLACEMENT Right 02/22/2013   Procedure:  CREATION  OF BRACHIAL CEPHALIC FISTULA RIGHT ARM;  Surgeon: CElam Dutch MD;  Location: [place]  Service: Vascular;  Laterality: Right;   AV  FISTULA PLACEMENT Left 08/10/2014   Procedure: BASILIC VEIN TRANSPOSITION  ARTERIOVENOUS (AV) FISTULA CREATION LEFT UPPER ARM;  Surgeon: Mal Misty, MD;  Location: [place];  Service: Vascular;  Laterality: Left;   AV FISTULA PLACEMENT, RADIOCEPHALIC Left 96/29/5284   @[facility] ;  Left lower arm   COLONOSCOPY  11/30/2018   by dr Tarri Glenn   ESOPHAGOGASTRODUODENOSCOPY (EGD) WITH PROPOFOL N/A 04/12/2013   Procedure: ESOPHAGOGASTRODUODENOSCOPY (EGD) WITH PROPOFOL;  Surgeon: Arta Silence, MD;  Location: [facility];  Service: Endoscopy;  Laterality: N/A;   GOLD SEED IMPLANT N/A 06/11/2021   Procedure: GOLD SEED IMPLANT;  Surgeon: Janith Lima, MD;  Location: [facility];  Service: Urology;  Laterality:  N/A;   INSERTION OF DIALYSIS CATHETER N/A 12/23/2012   Procedure: INSERTION OF DIALYSIS CATHETER; ULTRASOUND GUIDED;  Surgeon: Angelia Mould, MD;  Location: [place];  Service: Vascular;  Laterality: N/A;   INSERTION OF DIALYSIS CATHETER  10/22/2015   Right IJ non-tunneled HD catheter, placed again in 2019   IR FLUORO GUIDE CV LINE RIGHT  08/18/2019   IR FLUORO GUIDE CV LINE RIGHT  10/07/2019   IR GENERIC HISTORICAL  10/22/2015   IR US GUIDE VASC ACCESS RIGHT 10/22/2015 [facility]   IR GENERIC HISTORICAL  10/22/2015   IR FLUORO GUIDE CV LINE RIGHT 10/22/2015 [facility]   IR GENERIC HISTORICAL  10/23/2015   IR FLUORO GUIDE CV LINE RIGHT 10/23/2015 Marybelle Killings, MD [facility]   LEFT HEART CATHETERIZATION WITH CORONARY ANGIOGRAM N/A 07/13/2013   Procedure: LEFT HEART CATHETERIZATION WITH CORONARY ANGIOGRAM;  Surgeon: Jettie Booze, MD;  Location: [facility] CATH [facility];  Service: Cardiovascular;  Laterality: N/A;   LIGATION OF ARTERIOVENOUS  FISTULA Left 12/22/2012   Procedure: LIGATION OF ARTERIOVENOUS  FISTULA;EXCISION OF LARGE ANEURYSMS;;  Surgeon: Elam Dutch, MD;  Location: [place];  Service: Vascular;  Laterality: Left;   SPACE OAR INSTILLATION N/A 06/11/2021   Procedure: SPACE OAR INSTILLATION;  Surgeon: Janith Lima, MD;  Location: [facility];  Service: Urology;  Laterality: N/A;   UPPER GASTROINTESTINAL ENDOSCOPY  09/07/2018   by dr Tarri Glenn   Family History  Family History  Problem Relation Age of Onset   Hypertension Mother    Cerebrovascular Accident Father    Hypertension Father    Congestive Heart Failure Brother    Asthma Brother    Stomach cancer Neg Hx    Rectal cancer Neg Hx    Esophageal cancer Neg Hx    Colon cancer Neg Hx    Social History  reports that he has been smoking cigarettes. He has a 8.50 pack-year smoking history. He has never used smokeless tobacco. He reports current alcohol use. He reports current drug use. Frequency: 4.00 times per week. Drug:  Marijuana. Allergies  Allergies  Allergen Reactions   Venofer  [Ferric Oxide] Itching   Aspirin Other (See Comments)    Avoids due to renal disease;  Made congestion worse    Home medications Prior to Admission medications   Medication Sig Start Date End Date Taking? Authorizing Provider  amLODipine (NORVASC) 10 MG tablet TAKE 1 TABLET(10 MG) BY MOUTH DAILY Patient taking differently: Take 10 mg by mouth at bedtime. 04/26/18  Yes Allred, Jeneen Rinks, MD  chlorproMAZINE (THORAZINE) 10 MG tablet Take 1 tablet (10 mg total) by mouth 3 (three) times daily as needed for hiccups. 08/10/20  Yes Allie Bossier, MD  cloNIDine (CATAPRES - DOSED IN MG/24 HR) 0.3 mg/24hr patch Place 0.3 mg onto the skin every  Sunday. 12/29/18  Yes [provider]  hydrALAZINE (APRESOLINE) 25 MG tablet Take 1 tablet (25 mg total) by mouth 2 (two) times daily. Patient taking differently: Take 25 mg by mouth 2 (two) times daily. 09/13/20  Yes Chandrasekhar, Mahesh A, MD  isosorbide dinitrate (ISORDIL) 20 MG tablet TAKE 1 TABLET(20 MG) BY MOUTH TWICE DAILY Patient taking differently: Take 20 mg by mouth at bedtime. TAKE 1 TABLET(20 MG) BY MOUTH TWICE DAILY 11/01/20  Yes Chandrasekhar, Mahesh A, MD  oxybutynin (DITROPAN XL) 10 MG 24 hr tablet Take 1 tablet (10 mg total) by mouth at bedtime. Patient taking differently: Take 10 mg by mouth daily as needed (frequency). 03/21/21  Yes Bruning, Ashlyn, PA-C  tamsulosin (FLOMAX) 0.4 MG CAPS capsule TAKE 2 CAPSULES(0.8 MG) BY MOUTH DAILY Patient taking differently: Take 0.4 mg by mouth daily after supper. 09/06/20  Yes Cresenzo, Angelyn Punt, MD  acetaminophen (TYLENOL) 500 MG tablet Take 500 mg by mouth every 6 (six) hours as needed.    [provider]  heparin 1000 unit/mL SOLN injection Heparin Sodium (Porcine) 1,000 Units/mL Systemic 11/21/20 11/20/21  [provider]  hydrOXYzine (ATARAX/VISTARIL) 25 MG tablet Take 1 tablet (25 mg total) by mouth every 8 (eight) hours  as needed for itching. Further refills need to come from your PCP. 04/23/17   Dorothy Spark, MD  Ipratropium-Albuterol (COMBIVENT) 20-100 MCG/ACT AERS respimat Inhale 1 puff into the lungs every 6 (six) hours as needed for wheezing. 08/10/20   Allie Bossier, MD  metoprolol tartrate (LOPRESSOR) 100 MG tablet Take 1 tablet by mouth twice a day.  ON DIALYSIS DAYS, TAKE AFTER DIALYSIS. Patient taking differently: as directed. Take 1 tablet by mouth twice a day.  ON DIALYSIS DAYS, TAKE AFTER DIALYSIS. 11/01/20   Rudean Haskell A, MD  omeprazole (PRILOSEC) 40 MG capsule Take 40 mg by mouth daily as needed (heartburn). 02/27/21   [provider]  ondansetron (ZOFRAN) 8 MG tablet Take 8 mg by mouth every 8 (eight) hours as needed for nausea/vomiting. 05/14/21   [provider]  pantoprazole (PROTONIX) 40 MG tablet Take 1 tablet (40 mg total) by mouth 2 (two) times daily before a meal. Patient taking differently: Take 40 mg by mouth 2 (two) times daily as needed (acid reflux). 03/10/19   Thornton Park, MD  sevelamer carbonate (RENVELA) 800 MG tablet Take 1,600-3,200 mg by mouth See admin instructions. 2-4 tabs with meals depending on meal size    [provider]  umeclidinium-vilanterol (ANORO ELLIPTA) 62.5-25 MCG/INH AEPB Inhale 1 puff into the lungs daily. Patient taking differently: Inhale 1 puff into the lungs daily as needed (shortneess of breath). 09/20/19   Autry-Lott, Naaman Plummer, DO     Vitals:   09/15/21 0915 09/15/21 1023 09/15/21 1100 09/15/21 1205  BP: (!) 178/89  (!) 170/100 (!) 168/99  Pulse: 64  (!) 105 (!) 55  Resp: (!) 21  14 15   Temp:  98 F (36.7 C)    TempSrc:  Oral    SpO2: 94%  97% 95%  Weight:      Height:       Exam Gen alert, no distress, up in chair No rash, cyanosis or gangrene Sclera anicteric, throat clear  No jvd or bruits Chest clear bilat to bases, no rales/ wheezing RRR no MRG Abd soft ntnd no mass or ascites +bs GU normal  male MS no joint effusions or deformity Ext trace ankle edema, no wounds or ulcers Neuro is alert, Ox 3 ,  LUE and LLE weakness    RIJ TDC intact   Home meds include - amlodipine 10, clonidine 0.3 patch, hydralazine 25 bid, isosorbide dinitrate, tamsulosin, ipratropium-albuterol, metoprolol 100 qd or bid, omeprazole, pantoprazole, sevelamer carbonate 2-4 ac, umeclidinium- vilanterol, prns/ vits/ supps   [facility] HD: [place] M[facility] 4h  400/800  85kg  2/2 bath  P2  Heparin 4500 then 206mdrun prn  RIJ TDC - last hep B labs: pend - sodium ferric gluconate 125 mg weekly IV, last 8/11 - doxercalciferol 4 ug IV tiw - last HD 8/11, post wt 85.1kg - last Hb 8.4 on 8/9, ferritin 318 on 7/19  Assessment/ Plan: Subacute CVA - symptoms onset on 8/11, presented out of window for acute intervention. L sided weakness and R MCA CVA seen on MRI.  ESRD - on HD MWF. Has not missed HD. HD tomorrow.  Anemia esrd - Hb low at 6.8 here, not on OP esa due to malignancy (prostate Ca). Cont IV iron weekly w/ ferric gluconate. Transfuse prn, will d/w primary team.  MBD ckd - CCa borderline high, phos pending. Takes renvela 4 ac tid.  HTN/ volume - on multiple home BP lowering meds. Per pmd. Not grossly vol overloaded on exam, gets wts when in a room. UF 3-3.5 L w/ next HD.  Prostate cancer - just completed round of XRT.  COPD       RKelly Splinter MD 09/15/2021, 1:52 PM Recent Labs  Lab 09/14/21 1323 09/15/21 0329 09/15/21 0330  HGB 6.8*  --  6.8*  ALBUMIN 3.3* 3.1*  --   CALCIUM 10.1 9.8  --   CREATININE 7.52* 8.86*  --   K 3.6 4.0  --

## 2021-09-16 ENCOUNTER — Encounter (HOSPITAL_COMMUNITY): Payer: Self-pay | Admitting: Physical Medicine & Rehabilitation

## 2021-09-16 ENCOUNTER — Other Ambulatory Visit: Payer: Self-pay

## 2021-09-16 ENCOUNTER — Inpatient Hospital Stay (HOSPITAL_COMMUNITY)
Admission: RE | Admit: 2021-09-16 | Discharge: 2021-10-01 | DRG: 056 | Disposition: A | Discharge status: Home Health | Payer: Medicare Other | Source: Intra-hospital | Attending: Physical Medicine & Rehabilitation | Admitting: Physical Medicine & Rehabilitation

## 2021-09-16 DIAGNOSIS — I63511 Cerebral infarction due to unspecified occlusion or stenosis of right middle cerebral artery: Secondary | ICD-10-CM | POA: Diagnosis present

## 2021-09-16 DIAGNOSIS — Z923 Personal history of irradiation: Secondary | ICD-10-CM

## 2021-09-16 DIAGNOSIS — I10 Essential (primary) hypertension: Secondary | ICD-10-CM | POA: Diagnosis not present

## 2021-09-16 DIAGNOSIS — T8242XA Displacement of vascular dialysis catheter, initial encounter: Secondary | ICD-10-CM | POA: Diagnosis not present

## 2021-09-16 DIAGNOSIS — Z823 Family history of stroke: Secondary | ICD-10-CM | POA: Diagnosis not present

## 2021-09-16 DIAGNOSIS — G4733 Obstructive sleep apnea (adult) (pediatric): Secondary | ICD-10-CM | POA: Diagnosis present

## 2021-09-16 DIAGNOSIS — C61 Malignant neoplasm of prostate: Secondary | ICD-10-CM | POA: Diagnosis present

## 2021-09-16 DIAGNOSIS — D649 Anemia, unspecified: Secondary | ICD-10-CM | POA: Diagnosis not present

## 2021-09-16 DIAGNOSIS — J449 Chronic obstructive pulmonary disease, unspecified: Secondary | ICD-10-CM | POA: Diagnosis present

## 2021-09-16 DIAGNOSIS — I517 Cardiomegaly: Secondary | ICD-10-CM | POA: Diagnosis not present

## 2021-09-16 DIAGNOSIS — Z992 Dependence on renal dialysis: Secondary | ICD-10-CM

## 2021-09-16 DIAGNOSIS — Z452 Encounter for adjustment and management of vascular access device: Secondary | ICD-10-CM | POA: Diagnosis not present

## 2021-09-16 DIAGNOSIS — N2581 Secondary hyperparathyroidism of renal origin: Secondary | ICD-10-CM | POA: Diagnosis present

## 2021-09-16 DIAGNOSIS — I63131 Cerebral infarction due to embolism of right carotid artery: Secondary | ICD-10-CM | POA: Diagnosis not present

## 2021-09-16 DIAGNOSIS — D696 Thrombocytopenia, unspecified: Secondary | ICD-10-CM | POA: Diagnosis present

## 2021-09-16 DIAGNOSIS — Z825 Family history of asthma and other chronic lower respiratory diseases: Secondary | ICD-10-CM

## 2021-09-16 DIAGNOSIS — Z79899 Other long term (current) drug therapy: Secondary | ICD-10-CM

## 2021-09-16 DIAGNOSIS — I639 Cerebral infarction, unspecified: Secondary | ICD-10-CM | POA: Diagnosis present

## 2021-09-16 DIAGNOSIS — I700 Atherosclerosis of aorta: Secondary | ICD-10-CM | POA: Diagnosis not present

## 2021-09-16 DIAGNOSIS — I440 Atrioventricular block, first degree: Secondary | ICD-10-CM | POA: Diagnosis present

## 2021-09-16 DIAGNOSIS — K7460 Unspecified cirrhosis of liver: Secondary | ICD-10-CM | POA: Diagnosis present

## 2021-09-16 DIAGNOSIS — F17210 Nicotine dependence, cigarettes, uncomplicated: Secondary | ICD-10-CM | POA: Diagnosis present

## 2021-09-16 DIAGNOSIS — I1311 Hypertensive heart and chronic kidney disease without heart failure, with stage 5 chronic kidney disease, or end stage renal disease: Secondary | ICD-10-CM | POA: Diagnosis present

## 2021-09-16 DIAGNOSIS — Y832 Surgical operation with anastomosis, bypass or graft as the cause of abnormal reaction of the patient, or of later complication, without mention of misadventure at the time of the procedure: Secondary | ICD-10-CM | POA: Diagnosis not present

## 2021-09-16 DIAGNOSIS — E1122 Type 2 diabetes mellitus with diabetic chronic kidney disease: Secondary | ICD-10-CM | POA: Diagnosis not present

## 2021-09-16 DIAGNOSIS — K219 Gastro-esophageal reflux disease without esophagitis: Secondary | ICD-10-CM | POA: Diagnosis not present

## 2021-09-16 DIAGNOSIS — K5909 Other constipation: Secondary | ICD-10-CM | POA: Diagnosis present

## 2021-09-16 DIAGNOSIS — R188 Other ascites: Secondary | ICD-10-CM | POA: Diagnosis present

## 2021-09-16 DIAGNOSIS — I509 Heart failure, unspecified: Secondary | ICD-10-CM | POA: Diagnosis not present

## 2021-09-16 DIAGNOSIS — I272 Pulmonary hypertension, unspecified: Secondary | ICD-10-CM | POA: Diagnosis present

## 2021-09-16 DIAGNOSIS — K5904 Chronic idiopathic constipation: Secondary | ICD-10-CM | POA: Diagnosis not present

## 2021-09-16 DIAGNOSIS — I69354 Hemiplegia and hemiparesis following cerebral infarction affecting left non-dominant side: Principal | ICD-10-CM

## 2021-09-16 DIAGNOSIS — I69322 Dysarthria following cerebral infarction: Secondary | ICD-10-CM | POA: Diagnosis not present

## 2021-09-16 DIAGNOSIS — I428 Other cardiomyopathies: Secondary | ICD-10-CM | POA: Diagnosis present

## 2021-09-16 DIAGNOSIS — N186 End stage renal disease: Secondary | ICD-10-CM | POA: Diagnosis present

## 2021-09-16 DIAGNOSIS — R0609 Other forms of dyspnea: Secondary | ICD-10-CM

## 2021-09-16 DIAGNOSIS — Z8249 Family history of ischemic heart disease and other diseases of the circulatory system: Secondary | ICD-10-CM | POA: Diagnosis not present

## 2021-09-16 DIAGNOSIS — Z7902 Long term (current) use of antithrombotics/antiplatelets: Secondary | ICD-10-CM | POA: Diagnosis not present

## 2021-09-16 DIAGNOSIS — E785 Hyperlipidemia, unspecified: Secondary | ICD-10-CM | POA: Diagnosis present

## 2021-09-16 DIAGNOSIS — J9811 Atelectasis: Secondary | ICD-10-CM | POA: Diagnosis not present

## 2021-09-16 DIAGNOSIS — D631 Anemia in chronic kidney disease: Secondary | ICD-10-CM | POA: Diagnosis present

## 2021-09-16 DIAGNOSIS — I12 Hypertensive chronic kidney disease with 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: Secondary | ICD-10-CM | POA: Diagnosis not present

## 2021-09-16 DIAGNOSIS — I132 Hypertensive heart and chronic kidney disease with heart failure and with stage 5 chronic kidney disease, or end stage renal disease: Secondary | ICD-10-CM | POA: Diagnosis not present

## 2021-09-16 DIAGNOSIS — N250 Renal osteodystrophy: Secondary | ICD-10-CM | POA: Diagnosis not present

## 2021-09-16 DIAGNOSIS — N185 Chronic kidney disease, stage 5: Secondary | ICD-10-CM | POA: Diagnosis not present

## 2021-09-16 DIAGNOSIS — R531 Weakness: Secondary | ICD-10-CM | POA: Diagnosis not present

## 2021-09-16 LAB — HEPATITIS B SURFACE ANTIBODY,QUALITATIVE: Hep B S Ab: NONREACTIVE

## 2021-09-16 LAB — HEPATITIS B CORE ANTIBODY, TOTAL: Hep B Core Total Ab: NONREACTIVE

## 2021-09-16 LAB — CBC
HCT: 22.8 % — ABNORMAL LOW (ref 39.0–52.0)
Hemoglobin: 7.7 g/dL — ABNORMAL LOW (ref 13.0–17.0)
MCH: 34.5 pg — ABNORMAL HIGH (ref 26.0–34.0)
MCHC: 33.8 g/dL (ref 30.0–36.0)
MCV: 102.2 fL — ABNORMAL HIGH (ref 80.0–100.0)
Platelets: 151 10*3/uL (ref 150–400)
RBC: 2.23 MIL/uL — ABNORMAL LOW (ref 4.22–5.81)
RDW: 14.8 % (ref 11.5–15.5)
WBC: 3.4 10*3/uL — ABNORMAL LOW (ref 4.0–10.5)
nRBC: 0 % (ref 0.0–0.2)

## 2021-09-16 LAB — HEPATITIS B SURFACE ANTIGEN: Hepatitis B Surface Ag: NONREACTIVE

## 2021-09-16 LAB — HEPATITIS C ANTIBODY: HCV Ab: NONREACTIVE

## 2021-09-16 MED ORDER — NA FERRIC GLUC CPLX IN SUCROSE 12.5 MG/ML IV SOLN
250.0000 mg | INTRAVENOUS | Status: DC
  Administered 2021-09-16: 250 mg via INTRAVENOUS
  Filled 2021-09-16 (×2): qty 20

## 2021-09-16 MED ORDER — HEPARIN SODIUM (PORCINE) 1000 UNIT/ML IJ SOLN
INTRAMUSCULAR | Status: AC
  Administered 2021-09-16: 3000 [IU]
  Filled 2021-09-16: qty 3

## 2021-09-16 MED ORDER — ISOSORBIDE DINITRATE 20 MG PO TABS
20.0000 mg | ORAL_TABLET | Freq: Two times a day (BID) | ORAL | Status: DC
  Administered 2021-09-16 – 2021-10-01 (×28): 20 mg via ORAL
  Filled 2021-09-16 (×31): qty 1

## 2021-09-16 MED ORDER — CLONIDINE HCL 0.3 MG/24HR TD PTWK
0.3000 mg | MEDICATED_PATCH | TRANSDERMAL | Status: DC
  Administered 2021-09-22 – 2021-09-29 (×2): 0.3 mg via TRANSDERMAL
  Filled 2021-09-16 (×3): qty 1

## 2021-09-16 MED ORDER — METOPROLOL TARTRATE 50 MG PO TABS
100.0000 mg | ORAL_TABLET | Freq: Two times a day (BID) | ORAL | Status: DC
Start: 2021-09-16 — End: 2021-10-01
  Administered 2021-09-16 – 2021-09-27 (×17): 100 mg via ORAL
  Administered 2021-09-27: 50 mg via ORAL
  Administered 2021-09-28 – 2021-10-01 (×6): 100 mg via ORAL
  Filled 2021-09-16 (×29): qty 2

## 2021-09-16 MED ORDER — SODIUM CHLORIDE 0.9 % IV SOLN: 250.0000 mg | INTRAVENOUS | Status: DC

## 2021-09-16 MED ORDER — ISOSORBIDE DINITRATE 20 MG PO TABS
20.0000 mg | ORAL_TABLET | Freq: Two times a day (BID) | ORAL | Status: DC
  Filled 2021-09-16 (×2): qty 1

## 2021-09-16 MED ORDER — FAMOTIDINE 10 MG PO TABS: 10.0000 mg | ORAL_TABLET | Freq: Every day | ORAL | Status: DC | PRN

## 2021-09-16 MED ORDER — TAMSULOSIN HCL 0.4 MG PO CAPS
0.4000 mg | ORAL_CAPSULE | Freq: Every day | ORAL | Status: DC
  Administered 2021-09-16 – 2021-09-30 (×14): 0.4 mg via ORAL
  Filled 2021-09-16 (×14): qty 1

## 2021-09-16 MED ORDER — ROSUVASTATIN CALCIUM 5 MG PO TABS
10.0000 mg | ORAL_TABLET | Freq: Every day | ORAL | Status: DC
Start: 2021-09-17 — End: 2021-10-01
  Administered 2021-09-17 – 2021-10-01 (×15): 10 mg via ORAL
  Filled 2021-09-16 (×15): qty 2

## 2021-09-16 MED ORDER — UMECLIDINIUM-VILANTEROL 62.5-25 MCG/ACT IN AEPB
1.0000 | INHALATION_SPRAY | Freq: Every day | RESPIRATORY_TRACT | Status: DC
Start: 2021-09-16 — End: 2021-09-16
  Filled 2021-09-16: qty 14

## 2021-09-16 MED ORDER — HEPARIN SODIUM (PORCINE) 1000 UNIT/ML DIALYSIS
2000.0000 [IU] | INTRAMUSCULAR | Status: DC | PRN
  Administered 2021-09-18: 2000 [IU] via INTRAVENOUS_CENTRAL
  Filled 2021-09-16 (×2): qty 2

## 2021-09-16 MED ORDER — ISOSORBIDE DINITRATE 20 MG PO TABS: 20.0000 mg | ORAL_TABLET | Freq: Two times a day (BID) | ORAL | Status: DC

## 2021-09-16 MED ORDER — CLOPIDOGREL BISULFATE 75 MG PO TABS: 75.0000 mg | ORAL_TABLET | Freq: Every day | ORAL | Status: DC

## 2021-09-16 MED ORDER — AMLODIPINE BESYLATE 10 MG PO TABS
10.0000 mg | ORAL_TABLET | Freq: Every day | ORAL | Status: DC
  Administered 2021-09-16 – 2021-09-30 (×15): 10 mg via ORAL
  Filled 2021-09-16 (×15): qty 1

## 2021-09-16 MED ORDER — CLOPIDOGREL BISULFATE 75 MG PO TABS
75.0000 mg | ORAL_TABLET | Freq: Every day | ORAL | Status: DC
  Administered 2021-09-17 – 2021-10-01 (×15): 75 mg via ORAL
  Filled 2021-09-16 (×15): qty 1

## 2021-09-16 MED ORDER — ROSUVASTATIN CALCIUM 10 MG PO TABS: 10.0000 mg | ORAL_TABLET | Freq: Every day | ORAL | Status: DC

## 2021-09-16 MED ORDER — HYDRALAZINE HCL 25 MG PO TABS
25.0000 mg | ORAL_TABLET | Freq: Two times a day (BID) | ORAL | Status: DC
  Administered 2021-09-16 – 2021-10-01 (×27): 25 mg via ORAL
  Filled 2021-09-16 (×28): qty 1

## 2021-09-16 MED ORDER — SEVELAMER CARBONATE 800 MG PO TABS
3200.0000 mg | ORAL_TABLET | Freq: Three times a day (TID) | ORAL | Status: DC
Start: 2021-09-17 — End: 2021-10-01
  Administered 2021-09-17 – 2021-09-19 (×5): 3200 mg via ORAL
  Administered 2021-09-20 (×2): 2400 mg via ORAL
  Administered 2021-09-21 – 2021-09-26 (×13): 3200 mg via ORAL
  Administered 2021-09-27 (×2): 2400 mg via ORAL
  Administered 2021-09-28: 3200 mg via ORAL
  Administered 2021-09-28 – 2021-09-29 (×4): 2400 mg via ORAL
  Administered 2021-09-30: 3200 mg via ORAL
  Administered 2021-09-30: 2400 mg via ORAL
  Administered 2021-09-30 – 2021-10-01 (×2): 3200 mg via ORAL
  Filled 2021-09-16 (×39): qty 4

## 2021-09-16 MED ORDER — SODIUM CHLORIDE 0.9 % IV SOLN
250.0000 mg | INTRAVENOUS | Status: AC
  Administered 2021-09-18 – 2021-09-20 (×2): 250 mg via INTRAVENOUS
  Filled 2021-09-16 (×3): qty 20

## 2021-09-16 MED ORDER — UMECLIDINIUM-VILANTEROL 62.5-25 MCG/ACT IN AEPB
1.0000 | INHALATION_SPRAY | Freq: Every day | RESPIRATORY_TRACT | Status: DC
  Administered 2021-09-17 – 2021-09-19 (×3): 1 via RESPIRATORY_TRACT
  Filled 2021-09-16: qty 14

## 2021-09-16 MED ORDER — METOPROLOL TARTRATE 100 MG PO TABS: 100.0000 mg | ORAL_TABLET | Freq: Two times a day (BID) | ORAL | Status: DC

## 2021-09-16 NOTE — Progress Notes (Signed)
OT Cancellation Note  Patient Details Name: James Wall MRN: [id number] DOB: 02-Jan-1966   Cancelled Treatment:    Reason Eval/Treat Not Completed: Other (comment). OT has attempted x 2 to see pt this morning. Pt eating lunch on first attempt, the stroke team in with pt on second attempt. Will try again later before pt goes to HD as time allows  [name] 09/16/2021, 11:09 AM

## 2021-09-16 NOTE — Plan of Care (Signed)
  Problem: Clinical Measurements: Goal: Respiratory complications will improve Outcome: Progressing Goal: Cardiovascular complication will be avoided Outcome: Progressing   Problem: Activity: Goal: Risk for activity intolerance will decrease Outcome: Progressing   Problem: Nutrition: Goal: Adequate nutrition will be maintained Outcome: Progressing   Problem: Coping: Goal: Level of anxiety will decrease Outcome: Progressing   Problem: Safety: Goal: Ability to remain free from injury will improve Outcome: Progressing   Problem: Skin Integrity: Goal: Risk for impaired skin integrity will decrease Outcome: Progressing

## 2021-09-16 NOTE — Progress Notes (Addendum)
Received patient in bed to unit.  Alert and oriented.  Informed consent signed and in  chart.   Treatment initiated: 1 Treatment completed: 1  Patient tolerated well.  Transported back to the room  alert, without acute distress.  Hand-off given to patient's nurse.   Access used: 1 Access issues: 1  Total UF removed: 1 Medication(s) given: 1 Post HD VS: 1 Post HD weight: 1   [facility]

## 2021-09-16 NOTE — Progress Notes (Signed)
Occupational Therapy Treatment Patient Details Name: James Wall MRN: [id number] DOB: [birth year] Today's Date: 09/16/2021   History of present illness 56 year old presenting with L sided weakness. MRI imaging of the brain reveals a right precentral gyrus/frontal lobe infarct. PMH: ESRD on hemodialysis Monday Wednesday Friday, hypertension, hyperlipidemia, prostate CA, anxiety, cirrhosis, COPD, First degree heart block, PNU.   OT comments  Pt making progress with functional goals. Pt instructed on self ROM exercises and able to return demo; pt provided with blue squeeze ball and instructed in grip/grasp, finger AROM exercises. OT will continue to follow acutely to maximize level of function and safety   Recommendations for follow up therapy are one component of a multi-disciplinary discharge planning process, led by the attending physician.  Recommendations may be updated based on patient status, additional functional criteria and insurance authorization.    Follow Up Recommendations  Acute inpatient rehab (3hours/day)    Assistance Recommended at Discharge Frequent or constant Supervision/Assistance  Patient can return home with the following  A lot of help with walking and/or transfers;A lot of help with bathing/dressing/bathroom;Assistance with cooking/housework;Direct supervision/assist for medications management;Direct supervision/assist for financial management;Assist for transportation;Help with stairs or ramp for entrance   Equipment Recommendations  BSC/3in1    Recommendations for Other Services      Precautions / Restrictions Precautions Precautions: Fall Restrictions Weight Bearing Restrictions: No       Mobility Bed Mobility               General bed mobility comments: pt up in chair upon OT arrival    Transfers Overall transfer level: Needs assistance Equipment used: Rolling walker (2 wheels) Transfers: Sit to/from Stand, Bed to  chair/wheelchair/BSC Sit to Stand: Mod assist, Min assist     Step pivot transfers: Min assist           Balance Overall balance assessment: Needs assistance Sitting-balance support: Feet supported, No upper extremity supported Sitting balance-Leahy Scale: Fair     Standing balance support: During functional activity, Bilateral upper extremity supported Standing balance-Leahy Scale: Poor                             ADL either performed or assessed with clinical judgement   ADL Overall ADL's : Needs assistance/impaired         Upper Body Bathing: Minimal assistance Upper Body Bathing Details (indicate cue type and reason): simulated seated Lower Body Bathing: Moderate assistance;Sitting/lateral leans Lower Body Bathing Details (indicate cue type and reason): simulated                     Functional mobility during ADLs: Moderate assistance;Minimal assistance;Cueing for safety;Rolling walker (2 wheels)      Extremity/Trunk Assessment Upper Extremity Assessment Upper Extremity Assessment: Generalized weakness LUE Deficits / Details: minimal movement into flxor pattern at shoulder and elbow; no movement noted wrist/hand; non-functional LUE Sensation: decreased light touch;decreased proprioception LUE Coordination: decreased fine motor;decreased gross motor       Cervical / Trunk Assessment Cervical / Trunk Assessment: Kyphotic    Vision Baseline Vision/History: 1 Wears glasses Ability to See in Adequate Light: 0 Adequate Patient Visual Report: No change from baseline     Perception     Praxis      Cognition Arousal/Alertness: Awake/alert Behavior During Therapy: WFL for tasks assessed/performed Overall Cognitive Status: Impaired/Different from baseline Area of Impairment: Attention, Safety/judgement, Awareness, Problem solving  Safety/Judgement: Decreased awareness of safety, Decreased awareness of  deficits   Problem Solving: Slow processing General Comments: tangential speech, hyperverbose        Exercises Other Exercises Other Exercises: Pt educated on self ROM exercies using R UE to assist L UE in shoulder FF, ADB, elbow flexion/extension    Shoulder Instructions       General Comments      Pertinent Vitals/ Pain       Pain Assessment Pain Assessment: No/denies pain Pain Intervention(s): Monitored during session  Home Living                                          Prior Functioning/Environment              Frequency  Min 2X/week        Progress Toward Goals  OT Goals(current goals can now be found in the care plan section)  Progress towards OT goals: Progressing toward goals     Plan Discharge plan remains appropriate    Co-evaluation                 AM-PAC OT "6 Clicks" Daily Activity     Outcome Measure   Help from another person eating meals?: A Little Help from another person taking care of personal grooming?: A Little Help from another person toileting, which includes using toliet, bedpan, or urinal?: A Lot Help from another person bathing (including washing, rinsing, drying)?: A Lot Help from another person to put on and taking off regular upper body clothing?: A Little Help from another person to put on and taking off regular lower body clothing?: A Lot 6 Click Score: 15    End of Session Equipment Utilized During Treatment: Rolling walker (2 wheels);Gait belt  OT Visit Diagnosis: Unsteadiness on feet (R26.81);Other abnormalities of gait and mobility (R26.89);Muscle weakness (generalized) (M62.81);Other symptoms and signs involving the nervous system (R29.898);Other symptoms and signs involving cognitive function;Hemiplegia and hemiparesis;Pain Hemiplegia - Right/Left: Left Hemiplegia - dominant/non-dominant: Non-Dominant Hemiplegia - caused by: Cerebral infarction   Activity Tolerance Patient tolerated  treatment well   Patient Left with call bell/phone within reach;in chair;with chair alarm set;with family/visitor present   Nurse Communication          Time: 9528-4132 OT Time Calculation (min): 26 min  Charges: OT General Charges $OT Visit: 1 Visit OT Treatments $Therapeutic Activity: 8-22 mins $Therapeutic Exercise: 8-22 mins    [name] 09/16/2021, 12:33 PM

## 2021-09-16 NOTE — Progress Notes (Signed)
Paged and notified Dr

## 2021-09-16 NOTE — H&P (Signed)
Physical Medicine and Rehabilitation Admission H&P    Chief Complaint  Patient presents with   Weakness  : HPI: James Wall is a 56 year old right-handed male with history of chronic anemia, nonalcoholic cirrhosis as well as ascites with paracentesis 2014-2015, COPD/OSA no longer on CPAP, end-stage renal disease with hemodialysis since 2009, hypertension, prostate cancer followed by Dr. Gloriann Loan, first-degree AV block, hypertension, hyperlipidemia, tobacco use.  Per chart review patient lives alone.  1 level home one-step to entry.  He takes SCAT to hemodialysis.  He has a sister and friends to take him to the grocery store when needed.  Presented 09/14/2021 with with left arm weakness as well as dysarthric speech 09/11/2021 and patient did not initially seek medical attention.  He was having a hard time holding his utensils.  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  Remote lacunar infarct of the left thalamus.  CT angiogram head and neck negative for large vessel occlusion.  Bulky calcified plaque about the right carotid bulb/proximal right ICA with associated stenosis of up to 40%.  Admission chemistries unremarkable except BUN 23, creatinine 7.52, hemoglobin 6.8, alcohol negative.  Echocardiogram with ejection fraction of 55 to 60% no wall motion abnormality grade 3 diastolic dysfunction.  Patient did not receive tPA.  Neurology follow-up currently maintained on Plavix for CVA prophylaxis.  DAPT held due to chronic anemia as well as reported intolerance to aspirin.  Renal service follow-up with hemodialysis ongoing as directed.  Tolerating a regular consistency diet.  Therapy evaluations completed due to patient's left-sided weakness dysarthric speech was admitted for a comprehensive rehab program.  Review of Systems  Constitutional:  Negative for chills and fever.  HENT:  Negative for hearing  loss.   Eyes:  Negative for blurred vision and double vision.  Respiratory:  Negative for cough.        Shortness of breath with exertion  Cardiovascular:  Positive for leg swelling. Negative for chest pain and palpitations.  Gastrointestinal:  Positive for constipation.       GERD  Genitourinary:  Negative for dysuria, flank pain and hematuria.  Musculoskeletal:  Positive for joint pain and myalgias.  Skin:  Negative for rash.  Neurological:  Positive for speech change, weakness and headaches.  Psychiatric/Behavioral:  Positive for depression.        Anxiety  All other systems reviewed and are negative.  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)    primary cancer is prostate   Anemia associated with chronic renal failure    Anxiety    Arthritis    Cirrhosis, nonalcoholic ([place])    per pt possible from a medication , unsure ;   last ultrasound 08-09-2020 in epic no fibrosis   COPD (chronic obstructive pulmonary disease) ([place])    Depression    ESRD on hemodialysis ([place]) 2009   Started HD Jan 2009;  ESRD secondary to hypertensive nephrosclerosis;  dialysis at [facility] on MWF   First degree heart block    GERD (gastroesophageal reflux disease)    Hiatal hernia    History of acute respiratory failure 07/2013   admission;  HCAP w/ ARF with hypoxia   History of adenomatous polyp of colon    History of ascites    s/p paracentesis 01-31-2013 (5L) and last one 03-28-2013 (2.7L)   History of community  acquired pneumonia 08/08/2020   admission ; LLL , POA   History of heart murmur in childhood    History of MRSA infection 12/19/2012   hospital admission;  w/ sepsis MRSA bacteramia AVF infection   History of sepsis 03/2017   admission;   HCAP w/ sepsis   Hyperlipidemia    Hypertension    Hypertensive heart disease    cardiologist--- dr Osborne Oman;  nuclear stress test 06-16-2013  intermediate risk w/ mid-distal anterior wall ischemia , not gated;  cardiac cath 07-13-2013 in epic showed normal coronaries and LVSF,  ef not assessed, LCEDP 46mHg   Hypertensive nephrosclerosis, 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 ([place]    Intolerance to cold    due to anemia   Malignant neoplasm prostate ([facility]    urologist--- dr bell/  radiation onologist--- dr mTammi Klippel  dx 01/ 2023,  Gleason 4+3, PSA 338  NICM (nonischemic cardiomyopathy) ([place]    followed by cardiology;   last echo in epic 09-13-2020 ef 55-60%   OSA (obstructive sleep apnea) 2009   no  longer using cpap since the yr started 2009;   sleep study in epic 05-11-2007 severe osa   PSVT ([place]oxysmal [place]ventricular tachycardia) [place]    event monitor 08-01-2019  SR w/ SVT runs , rare PAC/ PVC   Secondary hyperparathyroidism ([place]    Wears glasses    Past Surgical History:  Procedure Laterality Date   AV FISTULA PLACEMENT Right 02/22/2013   Procedure:  CREATION  OF BRACHIAL CEPHALIC FISTULA RIGHT ARM;  Surgeon: CElam Dutch MD;  [facility] [place] [place]  Service: Vascular;  Laterality: Right;   AV FISTULA PLACEMENT Left 08/10/2014   Procedure: BASILIC VEIN TRANSPOSITION  ARTERIOVENOUS (AV) FISTULA CREATION LEFT UPPER ARM;  Surgeon: JMal Misty MD;  [place]: [facility]  Service: Vascular;  Laterality: Left;   AV FISTULA PLACEMENT, RADIOCEPHALIC Left 005/18/3358  @[facility] ;  Left lower arm   COLONOSCOPY  11/30/2018   by dr bTarri Glenn  ESOPHAGOGASTRODUODENOSCOPY (EGD) WITH PROPOFOL N/A 04/12/2013   Procedure: ESOPHAGOGASTRODUODENOSCOPY (EGD) WITH PROPOFOL;  Surgeon: WArta Silence MD;  [facility];  Service: Endoscopy;  Laterality: N/A;   GOLD SEED IMPLANT N/A 06/11/2021   Procedure: GOLD SEED IMPLANT;  Surgeon: GJanith Lima MD;  Location[facility] ORS;  Service: Urology;  Laterality: N/A;   INSERTION OF DIALYSIS CATHETER N/A 12/23/2012   Procedure: INSERTION OF DIALYSIS CATHETER; ULTRASOUND GUIDED;   Surgeon: CAngelia Mould MD;  [place] [facility]  Service: Vascular;  Laterality: N/A;   INSERTION OF DIALYSIS CATHETER  10/22/2015   Right IJ non-tunneled HD catheter, placed again in 2019   IR FLUORO GUIDE CV LINE RIGHT  08/18/2019   IR FLUORO GUIDE CV LINE RIGHT  10/07/2019   IR GENERIC HISTORICAL  10/22/2015   IR [place] ACCESS RIGHT 10/22/2015 [facility]   IR GENERIC HISTORICAL  10/22/2015   IR FLUORO GUIDE CV LINE RIGHT 10/22/2015 [facility]   IR GENERIC HISTORICAL  10/23/2015   IR FLUORO GUIDE CV LINE RIGHT 10/23/2015 AMarybelle Killings MD [facility]INTER[facility] RAD   LEFT HEART CATHETERIZATION WITH CORONARY ANGIOGRAM N/A 07/13/2013   Procedure: LEFT HEART CATHETERIZATION WITH CORONARY ANGIOGRAM;  Surgeon: JJettie Booze MD;  [facility];  Service: Cardiovascular;  Laterality: N/A;   LIGATION OF ARTERIOVENOUS  FISTULA Left 12/22/2012   Procedure: LIGATION OF ARTERIOVENOUS  FISTULA;EXCISION OF LARGE ANEURYSMS;;  Surgeon: CElam Dutch MD;  [place]  Service: Vascular;  Laterality: Left;   SPACE OAR INSTILLATION N/A 06/11/2021   Procedure: SPACE OAR INSTILLATION;  Surgeon: Janith Lima, MD;  [facility];  Service: Urology;  Laterality: N/A;   UPPER GASTROINTESTINAL ENDOSCOPY  09/07/2018   by dr Tarri Glenn   Family History  Problem Relation Age of Onset   Hypertension Mother    Cerebrovascular Accident Father    Hypertension Father    Congestive Heart Failure Brother    Asthma Brother    Stomach cancer Neg Hx    Rectal cancer Neg Hx    Esophageal cancer Neg Hx    Colon cancer Neg Hx    Social History:  reports that he has been smoking cigarettes. He has a 8.50 pack-year smoking history. He has never used smokeless tobacco. He reports current alcohol use. He reports current drug use. Frequency: 4.00 times per week. Drug: Marijuana. Allergies:  Allergies  Allergen Reactions   Venofer  [Ferric Oxide] Itching   Aspirin Other (See Comments)    Avoids due  to renal disease;  Made congestion worse    Medications Prior to Admission  Medication Sig Dispense Refill   acetaminophen (TYLENOL) 500 MG tablet Take 500 mg by mouth every 6 (six) hours as needed.     amLODipine (NORVASC) 10 MG tablet TAKE 1 TABLET(10 MG) BY MOUTH DAILY (Patient taking differently: Take 10 mg by mouth at bedtime.) 90 tablet 0   chlorproMAZINE (THORAZINE) 10 MG tablet Take 1 tablet (10 mg total) by mouth 3 (three) times daily as needed for hiccups. 9 tablet 0   cloNIDine (CATAPRES - DOSED IN MG/24 HR) 0.3 mg/24hr patch Place 0.3 mg onto the skin once a week. Apply on Sundays     hydrALAZINE (APRESOLINE) 25 MG tablet Take 1 tablet (25 mg total) by mouth 2 (two) times daily. (Patient taking differently: Take 25 mg by mouth 2 (two) times daily.) 180 tablet 3   hydrOXYzine (ATARAX/VISTARIL) 25 MG tablet Take 1 tablet (25 mg total) by mouth every 8 (eight) hours as needed for itching. Further refills need to come from your PCP. 30 tablet 3   Ipratropium-Albuterol (COMBIVENT) 20-100 MCG/ACT AERS respimat Inhale 1 puff into the lungs every 6 (six) hours as needed for wheezing. 4 g 0   isosorbide dinitrate (ISORDIL) 20 MG tablet TAKE 1 TABLET(20 MG) BY MOUTH TWICE DAILY (Patient taking differently: Take 20 mg by mouth at bedtime. TAKE 1 TABLET(20 MG) BY MOUTH TWICE DAILY) 180 tablet 3   metoprolol tartrate (LOPRESSOR) 100 MG tablet Take 1 tablet by mouth twice a day.  ON DIALYSIS DAYS, TAKE AFTER DIALYSIS. (Patient taking differently: Take 100 mg by mouth daily. Take 1 tablet by mouth twice a day.  ON DIALYSIS DAYS, TAKE AFTER DIALYSIS.) 180 tablet 3   omeprazole (PRILOSEC) 40 MG capsule Take 40 mg by mouth daily as needed (heartburn).     ondansetron (ZOFRAN) 8 MG tablet Take 8 mg by mouth every 8 (eight) hours as needed for nausea/vomiting.     oxybutynin (DITROPAN XL) 10 MG 24 hr tablet Take 1 tablet (10 mg total) by mouth at bedtime. (Patient taking differently: Take 10 mg by mouth  daily as needed (frequency).) 30 tablet 2   pantoprazole (PROTONIX) 40 MG tablet Take 1 tablet (40 mg total) by mouth 2 (two) times daily before a meal. (Patient taking differently: Take 40 mg by mouth 2 (two) times daily as needed (acid reflux).) 180 tablet 3   sevelamer carbonate (RENVELA) 800  MG tablet Take 1,600-3,200 mg by mouth See admin instructions. Take 2-4 tablets by mouth daily  with meals depending on meal size per patient     tamsulosin (FLOMAX) 0.4 MG CAPS capsule TAKE 2 CAPSULES(0.8 MG) BY MOUTH DAILY (Patient taking differently: Take 0.4 mg by mouth daily after supper.) 180 capsule 3   umeclidinium-vilanterol (ANORO ELLIPTA) 62.5-25 MCG/INH AEPB Inhale 1 puff into the lungs daily. (Patient taking differently: Inhale 1 puff into the lungs daily as needed (For shortness of breath).) 60 each 1      Home: Home Living Family/patient expects to be discharged to:: Private residence Living Arrangements: Alone Available Help at Discharge: Family, Friend(s) Type of Home: House Home Access: Stairs to enter Technical brewer of Steps: 1 Entrance Stairs-Rails: Right Home Layout: One level Bathroom Shower/Tub: Tub/shower unit, Architectural technologist: Standard Bathroom Accessibility: Yes Home Equipment: Radio producer - single point  Lives With: Alone   Functional History: Prior Function Prior Level of Function : Independent/Modified Independent (has license) Mobility Comments: sister or friends take him to the grocery store; takes SCAT to HD ADLs Comments: takes sink baths  Functional Status:  Mobility: Bed Mobility Overal bed mobility: Needs Assistance Bed Mobility: Supine to Sit Supine to sit: Mod assist General bed mobility comments: Able to move bil LEs to EOB, though slowly; Heavy mod assist to pull to sit and square off hips at edge of stretcher Transfers Overall transfer level: Needs assistance Equipment used: 1 person hand held assist (and R UE stabilizing on bed side  chest, locked) Transfers: Sit to/from Stand Sit to Stand: Mod assist, Min assist General transfer comment: Mod assist to steady and close guard/obs of L knee at initial stand; Dependent on RUE to stabilize; L knee weak, but did not buckle; Min assist, mostly steadying with 2 more reps of sit to stand Ambulation/Gait Ambulation/Gait assistance: Min assist Gait Distance (Feet): 12 Feet Assistive device: 1 person hand held assist Gait Pattern/deviations: Decreased stance time - left, Decreased dorsiflexion - left General Gait Details: Walked around stretcher with Handheld assist/support LUE; LLE with some difficulty clearing floor and advancing, leading to decr step length    ADL: ADL Overall ADL's : Needs assistance/impaired Grooming: Minimal assistance Upper Body Bathing: Minimal assistance Lower Body Bathing: Moderate assistance, Bed level Upper Body Dressing : Moderate assistance Lower Body Dressing: Maximal assistance, Bed level Functional mobility during ADLs:  (pt declined)  Cognition: Cognition Overall Cognitive Status: Impaired/Different from baseline Arousal/Alertness: Awake/alert Orientation Level: Oriented X4 Memory: Appears intact Awareness: Appears intact Problem Solving: Appears intact Safety/Judgment: Appears intact Cognition Arousal/Alertness: Awake/alert Behavior During Therapy: WFL for tasks assessed/performed (at times tearful) Overall Cognitive Status: Impaired/Different from baseline Area of Impairment: Attention, Safety/judgement, Awareness, Problem solving Current Attention Level: Selective Safety/Judgement: Decreased awareness of safety, Decreased awareness of deficits Awareness: Emergent Problem Solving: Slow processing General Comments: decreased awarneness of how his deficits affect him functionally; will further assess  Physical Exam: Blood pressure (!) 184/88, pulse (!) 54, temperature 98.8 F (37.1 C), temperature source Oral, resp. rate 15,  height 5' 5"  (1.651 m), weight 84.8 kg, SpO2 100 %. Physical Exam Constitutional:      General: He is not in acute distress. HENT:     Head: Normocephalic.     Right Ear: External ear normal.     Left Ear: External ear normal.     Nose: Nose normal.     Mouth/Throat:     Pharynx: Oropharynx is clear.  Eyes:     Conjunctiva/sclera:  Conjunctivae normal.  Cardiovascular:     Rate and Rhythm: Regular rhythm. Bradycardia present.     Pulses: Normal pulses.  Pulmonary:     Effort: Pulmonary effort is normal. No respiratory distress.     Breath sounds: Normal breath sounds. No wheezing.  Abdominal:     General: Bowel sounds are normal. There is no distension.     Palpations: Abdomen is soft. There is no mass.  Musculoskeletal:     Cervical back: Normal range of motion.     Right lower leg: No edema.     Left lower leg: No edema.  Skin:    General: Skin is warm.     Comments: RIJ in place  Neurological:     Mental Status: He is alert.     Comments: Patient is alert.  Makes eye contact with examiner.  Speech is a bit dysarthric but intelligible.  Follows commands. Fair insight and awareness.  Provides name and age. LUE 2/5 prox to trace distally. LLE 2/5 prox to distal. No focal sensory findings.   Psychiatric:     Comments: Pt a little flat but generally appropriate.     Results for orders placed or performed during the hospital encounter of 09/14/21 (from the past 48 hour(s))  Ethanol     Status: None   Collection Time: 09/14/21  1:22 PM  Result Value Ref Range   Alcohol, Ethyl (B) <10 <10 mg/dL    Comment: (NOTE) Lowest detectable limit for serum alcohol is 10 mg/dL.  For medical purposes only. Performed at [facility], [place] [address] [place], [place] [address]   Type and screen [place]     Status: None (Preliminary result)   Collection Time: 09/14/21  1:22 PM  Result Value Ref Range   ABO/RH(D) O POS    Antibody Screen NEG    Sample  Expiration      09/17/2021,2359 Performed at [facility], [place] [address] [place], [place] [address]    Unit Number [id number]    Blood Component Type RED CELLS,LR    Unit division 00    Status of Unit ALLOCATED    Transfusion Status OK TO TRANSFUSE    Crossmatch Result Compatible    Unit Number [id number]    Blood Component Type RED CELLS,LR    Unit division 00    Status of Unit ALLOCATED    Transfusion Status OK TO TRANSFUSE    Crossmatch Result Compatible   Protime-INR     Status: None   Collection Time: 09/14/21  1:23 PM  Result Value Ref Range   Prothrombin Time 14.0 11.4 - 15.2 seconds   INR 1.1 0.8 - 1.2    Comment: (NOTE) INR goal varies based on device and disease states. Performed at [facility], [place] [address] [place], [place] [address]   APTT     Status: None   Collection Time: 09/14/21  1:23 PM  Result Value Ref Range   aPTT 33 24 - 36 seconds    Comment: Performed at [facility] [address] [place], [place] [address]  CBC     Status: Abnormal   Collection Time: 09/14/21  1:23 PM  Result Value Ref Range   WBC 3.0 (L) 4.0 - 10.5 K/uL   RBC 1.95 (L) 4.22 - 5.81 MIL/uL   Hemoglobin 6.8 (LL) 13.0 - 17.0 g/dL    Comment: REPEATED TO VERIFY THIS CRITICAL RESULT HAS VERIFIED AND BEEN  CALLED TO EMILEE SHA RN BY MELISSA BOOSTEDT ON 08 12 2023 AT 1347, AND HAS BEEN READ BACK.     HCT 20.0 (L) 39.0 - 52.0 %   MCV 102.6 (H) 80.0 - 100.0 fL   MCH 34.9 (H) 26.0 - 34.0 pg   MCHC 34.0 30.0 - 36.0 g/dL   RDW 14.6 11.5 - 15.5 %   Platelets 137 (L) 150 - 400 K/uL   nRBC 0.0 0.0 - 0.2 %    Comment: Performed at [facility] [address] [place], [place] [address]  Differential     Status: Abnormal   Collection Time: 09/14/21  1:23 PM  Result Value Ref Range   Neutrophils Relative % 67 %   Neutro Abs 2.0 1.7 - 7.7 K/uL   Lymphocytes Relative 16 %   Lymphs Abs 0.5 (L) 0.7 - 4.0 K/uL   Monocytes Relative 11 %   Monocytes  Absolute 0.3 0.1 - 1.0 K/uL   Eosinophils Relative 6 %   Eosinophils Absolute 0.2 0.0 - 0.5 K/uL   Basophils Relative 0 %   Basophils Absolute 0.0 0.0 - 0.1 K/uL   Immature Granulocytes 0 %   Abs Immature Granulocytes 0.01 0.00 - 0.07 K/uL    Comment: Performed at [facility] [address], [place], [place] [address]  Comprehensive metabolic panel     Status: Abnormal   Collection Time: 09/14/21  1:23 PM  Result Value Ref Range   Sodium 139 135 - 145 mmol/L   Potassium 3.6 3.5 - 5.1 mmol/L   Chloride 102 98 - 111 mmol/L   CO2 29 22 - 32 mmol/L   Glucose, Bld 95 70 - 99 mg/dL    Comment: Glucose reference range applies only to samples taken after fasting for at least 8 hours.   BUN 23 (H) 6 - 20 mg/dL   Creatinine, Ser 7.52 (H) 0.61 - 1.24 mg/dL   Calcium 10.1 8.9 - 10.3 mg/dL   Total Protein 6.1 (L) 6.5 - 8.1 g/dL   Albumin 3.3 (L) 3.5 - 5.0 g/dL   AST 15 15 - 41 U/L   ALT 12 0 - 44 U/L   Alkaline Phosphatase 52 38 - 126 U/L   Total Bilirubin 0.4 0.3 - 1.2 mg/dL   GFR, Estimated 8 (L) >60 mL/min    Comment: (NOTE) Calculated using the CKD-EPI Creatinine Equation (2021)    Anion gap 8 5 - 15    Comment: Performed at [facility], [place] [address] [place], [place] [address]  I-Stat beta hCG blood, ED ([facility], [facility], [facility] only)     Status: None   Collection Time: 09/14/21  1:47 PM  Result Value Ref Range   I-stat hCG, quantitative <5.0 <5 mIU/mL   Comment 3            Comment:   GEST. AGE      CONC.  (mIU/mL)   <=1 WEEK        5 - 50     2 WEEKS       50 - 500     3 WEEKS       100 - 10,000     4 WEEKS     1,000 - 30,000        MALE AND NON-PREGNANT MALE:     LESS THAN 5 mIU/mL   HIV Antibody (routine testing w rflx)     Status: None   Collection Time: 09/14/21  9:38 PM  Result Value Ref Range   HIV Screen 4th Generation wRfx Non Reactive Non Reactive    Comment: Performed at [facility], [place] [address] [place], [place] [address]  Hemoglobin A1c      Status: Abnormal   Collection Time: 09/14/21  9:38 PM  Result Value Ref Range   Hgb A1c MFr Bld 4.1 (L) 4.8 - 5.6 %    Comment: (NOTE) Pre diabetes:          5.7%-6.4%  Diabetes:              >6.4%  Glycemic control for   <7.0% adults with diabetes    Mean Plasma Glucose 70.97 mg/dL    Comment: Performed at [facility] [address] [place], [place] [address]  Magnesium     Status: None   Collection Time: 09/14/21  9:38 PM  Result Value Ref Range   Magnesium 2.1 1.7 - 2.4 mg/dL    Comment: Performed at [facility], [place] [address], [place], [place] [address]  TSH     Status: None   Collection Time: 09/14/21  9:38 PM  Result Value Ref Range   TSH 3.910 0.350 - 4.500 uIU/mL    Comment: Performed by a 3rd Generation assay with a functional sensitivity of <=0.01 uIU/mL. Performed at [facility], [place] [address], [place], [place] [address]   Lipid panel     Status: None   Collection Time: 09/14/21  9:38 PM  Result Value Ref Range   Cholesterol 126 0 - 200 mg/dL   Triglycerides 62 <150 mg/dL   HDL 59 >40 mg/dL   Total CHOL/HDL Ratio 2.1 RATIO   VLDL 12 0 - 40 mg/dL   LDL Cholesterol 55 0 - 99 mg/dL    Comment:        Total Cholesterol/HDL:CHD Risk Coronary Heart Disease Risk Table                     Men   Women  1/2 Average Risk   3.4   3.3  Average Risk       5.0   4.4  2 X Average Risk   9.6   7.1  3 X Average Risk  23.4   11.0        Use the calculated Patient Ratio above and the CHD Risk Table to determine the patient's CHD Risk.        ATP III CLASSIFICATION (LDL):  <100     mg/dL   Optimal  100-129  mg/dL   Near or Above                    Optimal  130-159  mg/dL   Borderline  160-189  mg/dL   High  >190     mg/dL   Very High Performed at [facility] [address], [place], [place] [address]   Comprehensive metabolic panel     Status: Abnormal   Collection Time: 09/15/21  3:29 AM  Result Value Ref Range   Sodium 138 135 - 145  mmol/L   Potassium 4.0 3.5 - 5.1 mmol/L   Chloride 101 98 - 111 mmol/L   CO2 26 22 - 32 mmol/L   Glucose, Bld 95 70 - 99 mg/dL    Comment: Glucose reference range applies only to samples taken after fasting for at least 8 hours.   BUN 25 (H) 6 -  20 mg/dL   Creatinine, Ser 8.86 (H) 0.61 - 1.24 mg/dL   Calcium 9.8 8.9 - 10.3 mg/dL   Total Protein 5.7 (L) 6.5 - 8.1 g/dL   Albumin 3.1 (L) 3.5 - 5.0 g/dL   AST 12 (L) 15 - 41 U/L   ALT 9 0 - 44 U/L   Alkaline Phosphatase 50 38 - 126 U/L   Total Bilirubin 0.4 0.3 - 1.2 mg/dL   GFR, Estimated 6 (L) >60 mL/min    Comment: (NOTE) Calculated using the CKD-EPI Creatinine Equation (2021)    Anion gap 11 5 - 15    Comment: Performed at [facility], [place] [address], [place], [place] [address]  CBC with Differential/Platelet     Status: Abnormal   Collection Time: 09/15/21  3:30 AM  Result Value Ref Range   WBC 2.7 (L) 4.0 - 10.5 K/uL   RBC 2.02 (L) 4.22 - 5.81 MIL/uL   Hemoglobin 6.8 (LL) 13.0 - 17.0 g/dL    Comment: CRITICAL VALUE NOTED.  VALUE IS CONSISTENT WITH PREVIOUSLY REPORTED AND CALLED VALUE. REPEATED TO VERIFY    HCT 21.3 (L) 39.0 - 52.0 %   MCV 105.4 (H) 80.0 - 100.0 fL   MCH 33.7 26.0 - 34.0 pg   MCHC 31.9 30.0 - 36.0 g/dL   RDW 14.8 11.5 - 15.5 %   Platelets 135 (L) 150 - 400 K/uL   nRBC 0.0 0.0 - 0.2 %   Neutrophils Relative % 67 %   Neutro Abs 1.8 1.7 - 7.7 K/uL   Lymphocytes Relative 16 %   Lymphs Abs 0.4 (L) 0.7 - 4.0 K/uL   Monocytes Relative 12 %   Monocytes Absolute 0.3 0.1 - 1.0 K/uL   Eosinophils Relative 5 %   Eosinophils Absolute 0.1 0.0 - 0.5 K/uL   Basophils Relative 0 %   Basophils Absolute 0.0 0.0 - 0.1 K/uL   Immature Granulocytes 0 %   Abs Immature Granulocytes 0.01 0.00 - 0.07 K/uL    Comment: Performed at [facility] [address] [place], [place] [address]  Prepare RBC (crossmatch)     Status: None   Collection Time: 09/16/21  2:23 PM  Result Value Ref Range   Order Confirmation       ORDER PROCESSED BY BLOOD BANK Performed at [facility], [place] [address], [place],  [address]    ECHOCARDIOGRAM COMPLETE  Result Date: 09/15/2021    ECHOCARDIOGRAM REPORT   Patient Name:   James Wall [facility] Date of Exam: 09/15/2021 Medical Rec #:  [id number]           Height:       65.0 in Accession #:    [id number]          Weight:       187.0 lb Date of Birth:  12/13/1965           BSA:          1.922 m Patient Age:    4 years            BP:           170/100 mmHg Patient Gender: M                   HR:           56 bpm. Exam Location:  Inpatient Procedure: 2D Echo Indications:    stroke  History:        Patient  has prior history of Echocardiogram examinations, most                 recent 09/13/2020. COPD and end stage renal disease,                 Arrythmias:first degree heart block; Risk Factors:Hypertension                 and Sleep Apnea.  Sonographer:    Johny Chess RDCS Referring Phys: [place]  1. Left ventricular ejection fraction, by estimation, is 55 to 60%. The left ventricle has normal function. The left ventricle has no regional wall motion abnormalities. There is moderate concentric left ventricular hypertrophy. Left ventricular diastolic parameters are consistent with Grade III diastolic dysfunction (restrictive). Elevated left atrial pressure.  2. Right ventricular systolic function is normal. The right ventricular size is normal. There is severely elevated pulmonary artery systolic pressure. The estimated right ventricular systolic pressure is 67.3 mmHg.  3. Left atrial size was severely dilated.  4. The mitral valve is normal in structure. Mild to moderate mitral valve regurgitation. No evidence of mitral stenosis.  5. Tricuspid valve regurgitation is mild to moderate.  6. The aortic valve is tricuspid. Aortic valve regurgitation is not visualized. No aortic stenosis is present.  7. The inferior vena cava is dilated in size with <50% respiratory  variability, suggesting right atrial pressure of 15 mmHg. Comparison(s): RVSP is worse than prior; stable LV function. FINDINGS  Left Ventricle: Left ventricular ejection fraction, by estimation, is 55 to 60%. The left ventricle has normal function. The left ventricle has no regional wall motion abnormalities. The left ventricular internal cavity size was normal in size. There is  moderate concentric left ventricular hypertrophy. Left ventricular diastolic parameters are consistent with Grade III diastolic dysfunction (restrictive). Elevated left atrial pressure. Right Ventricle: The right ventricular size is normal. No increase in right ventricular wall thickness. Right ventricular systolic function is normal. There is severely elevated pulmonary artery systolic pressure. The tricuspid regurgitant velocity is 4.36 m/s, and with an assumed right atrial pressure of 15 mmHg, the estimated right ventricular systolic pressure is 41.9 mmHg. Left Atrium: Left atrial size was severely dilated. Right Atrium: Right atrial size was normal in size. Pericardium: Trivial pericardial effusion is present. Mitral Valve: The mitral valve is normal in structure. Mild mitral annular calcification. Mild to moderate mitral valve regurgitation. No evidence of mitral valve stenosis. Tricuspid Valve: The tricuspid valve is normal in structure. Tricuspid valve regurgitation is mild to moderate. No evidence of tricuspid stenosis. Aortic Valve: The aortic valve is tricuspid. Aortic valve regurgitation is not visualized. No aortic stenosis is present. Pulmonic Valve: The pulmonic valve was normal in structure. Pulmonic valve regurgitation is not visualized. No evidence of pulmonic stenosis. Aorta: The aortic root and ascending aorta are structurally normal, with no evidence of dilitation. Venous: The inferior vena cava is dilated in size with less than 50% respiratory variability, suggesting right atrial pressure of 15 mmHg. IAS/Shunts: No  atrial level shunt detected by color flow Doppler.  LEFT VENTRICLE PLAX 2D LVIDd:         5.30 cm Diastology LVIDs:         3.70 cm LV e' medial:    5.33 cm/s LV PW:         1.40 cm LV E/e' medial:  23.8 LV IVS:        1.20 cm LV e' lateral:   4.35 cm/s  LV E/e' lateral: 29.2  RIGHT VENTRICLE             IVC RV S prime:     11.90 cm/s  IVC diam: 2.40 cm TAPSE (M-mode): 1.8 cm LEFT ATRIUM              Index        RIGHT ATRIUM           Index LA diam:        4.80 cm  2.50 cm/m   RA Area:     16.20 cm LA Vol (A2C):   100.0 ml 52.02 ml/m  RA Volume:   41.90 ml  21.80 ml/m LA Vol (A4C):   113.0 ml 58.78 ml/m LA Biplane Vol: 106.0 ml 55.14 ml/m  AORTIC VALVE LVOT Vmax:   94.00 cm/s LVOT Vmean:  59.500 cm/s LVOT VTI:    0.209 m  AORTA Ao Asc diam: 3.20 cm MITRAL VALVE                TRICUSPID VALVE MV Area (PHT): 3.42 cm     TR Peak grad:   76.0 mmHg MV Decel Time: 222 msec     TR Vmax:        436.00 cm/s MR Peak grad: 108.6 mmHg MR Mean grad: 77.0 mmHg     SHUNTS MR Vmax:      521.00 cm/s   Systemic VTI: 0.21 m MR Vmean:     416.0 cm/s MV E velocity: 127.00 cm/s MV A velocity: 59.50 cm/s MV E/A ratio:  2.13 Rudean Haskell MD Electronically signed by Rudean Haskell MD Signature Date/Time: 09/15/2021/2:42:29 PM    Final    CT ANGIO HEAD NECK W WO CM  Result Date: 09/14/2021 CLINICAL DATA:  Follow-up examination for stroke. EXAM: CT ANGIOGRAPHY HEAD AND NECK TECHNIQUE: Multidetector CT imaging of the head and neck was performed using the standard protocol during bolus administration of intravenous contrast. Multiplanar CT image reconstructions and MIPs were obtained to evaluate the vascular anatomy. Carotid stenosis measurements (when applicable) are obtained utilizing NASCET criteria, using the distal internal carotid diameter as the denominator. RADIATION DOSE REDUCTION: This exam was performed according to the departmental dose-optimization program which includes automated  exposure control, adjustment of the mA and/or kV according to patient size and/or use of iterative reconstruction technique. CONTRAST:  195m OMNIPAQUE IOHEXOL 350 MG/ML SOLN COMPARISON:  MRI from earlier the same day. FINDINGS: CT HEAD FINDINGS Brain: Atrophy with moderate chronic microvascular ischemic disease. Few identified right frontal infarct better seen on prior MRI. No other acute large vessel territory infarct. No intracranial hemorrhage. No mass lesion, midline shift or mass effect. No hydrocephalus or extra-axial fluid collection. Vascular: No hyperdense vessel. Calcified atherosclerosis present at skull base. Skull: Scalp soft tissues and calvarium within normal limits. Sinuses/Orbits: Globes orbital soft tissues within normal limits. Paranasal sinuses and mastoid air cells are largely clear. Other: None. Review of the MIP images confirms the above findings CTA NECK FINDINGS Aortic arch: Visualized aortic arch normal caliber with standard branch pattern. Moderate atheromatous change about the arch itself. No high-grade stenosis about the origin the great vessels. Right carotid system: Bulky calcified plaque about the right carotid bulb/proximal right ICA with associated stenosis of up to 40% by NASCET criteria. No dissection or vascular occlusion. Left carotid system: Or few calcified plaque about the left carotid bulb/proximal left ICA without hemodynamically significant stenosis. No dissection or vascular occlusion. Vertebral arteries: Left vertebral artery arises directly from the  aortic arch. Right vertebral artery dominant. Extensive plaque about the proximal right vertebral artery with associated up to severe stenosis at the pre foraminal right V1 segment (a series 13, image 252). Vertebral arteries otherwise patent without stenosis or dissection. Skeleton: No visible discrete or worrisome osseous lesions. Moderate spondylosis present at C5-6. Other neck: Multiple prominent lymph nodes seen within  the lower left neck, measuring up to 1.1 cm at the left supraclavicular fossa. No other acute abnormality. Upper chest: Extensive mediastinal adenopathy is seen, largest of which measures up to 1.3 cm at prevascular space of the indeterminate. Layering bilateral pleural effusions with evidence of pulmonary interstitial edema within the visualized lungs, suggesting CHF. Right-sided central venous catheter in place. Review of the MIP images confirms the above findings CTA HEAD FINDINGS Anterior circulation: Petrous segments patent bilaterally. Atheromatous change within the carotid siphons with up to moderate stenoses about the para clinoid segments bilaterally. A1 segments patent. Normal anterior communicating artery complex. Anterior cerebral arteries patent without stenosis. No M1 stenosis or occlusion. No proximal MCA branch occlusion. Distal MCA branches perfused and symmetric right Posterior circulation: Dominant right V4 segment widely patent. Right PICA grossly patent at its origin. Hypoplastic left vertebral artery terminates in PICA. Left PICA patent as well. Basilar patent to its distal aspect without stenosis. Superior cerebellar and posterior cerebral arteries patent bilaterally. Venous sinuses: Patent allowing for timing the contrast bolus. Anatomic variants: Hypoplastic left vertebral artery terminates in PICA. No intracranial aneurysm. Review of the MIP images confirms the above findings IMPRESSION: CT HEAD IMPRESSION: 1. No acute intracranial abnormality by CT. Previously identified right MCA distribution infarct better seen on prior MRI. No intracranial hemorrhage. 2. Atrophy with moderate chronic microvascular ischemic disease. CTA HEAD AND NECK IMPRESSION: 1. Negative CTA for large vessel occlusion or other emergent finding. 2. Bulky calcified plaque about the right carotid bulb/proximal right ICA with associated stenosis of up to 40% by NASCET criteria. 3. Atheromatous change about the carotid  siphons with up to moderate stenoses about the para clinoid segments bilaterally. 4. Extensive plaque about the proximal right vertebral artery with associated severe stenosis at the pre foraminal right V1 segment. Right vertebral artery dominant. Left vertebral artery hypoplastic and terminates in PICA, with no significant contribution to the posterior circulation. 5. Layering bilateral pleural effusions with pulmonary interstitial edema within the visualized lungs, suggesting CHF. 6. Prominent adenopathy within the lower left neck and visualized mediastinum. Findings are nonspecific, and could be reactive in nature. Possible nodal metastatic disease or lymphoproliferative disorder could also have this appearance. Electronically Signed   By: Jeannine Boga M.D.   On: 09/14/2021 23:48   MR BRAIN WO CONTRAST  Addendum Date: 09/14/2021   ADDENDUM REPORT: 09/14/2021 19:16 ADDENDUM: The acute infarct is in the right frontal lobe, not the left frontal lobe. Electronically Signed   By: San Morelle M.D.   On: 09/14/2021 19:16   Result Date: 09/14/2021 CLINICAL DATA:  Neuro deficit, acute, stroke suspected. Increased weakness, asymmetric on the left since Wednesday. EXAM: MRI HEAD WITHOUT CONTRAST TECHNIQUE: Multiplanar, multiecho pulse sequences of the brain and surrounding structures were obtained without intravenous contrast. COMPARISON:  Iron infusion yesterday. FINDINGS: Brain: Acute nonhemorrhagic infarct is demonstrated in the left precentral gyrus extending into the corona radiata. There is some involvement of more anterior frontal lobe as well. T2 and FLAIR changes are associated. Moderate atrophy and scattered periventricular subcortical T2 hyperintensities are present bilaterally separate from the area of acute/subacute infarct. Dilated perivascular spaces are  present throughout the basal ganglia. The ventricles are of normal size. Remote lacunar infarct is present in the left thalamus. The  internal auditory canals are within normal limits. The brainstem and cerebellum are within normal limits. Vascular: Flow is present in the major intracranial arteries. Skull and upper cervical spine: The craniocervical junction is normal. Upper cervical spine is within normal limits. Marrow signal is unremarkable. Sinuses/Orbits: The paranasal sinuses and mastoid air cells are clear. The globes and orbits are within normal limits. IMPRESSION: 1. Acute nonhemorrhagic infarct involving the left precentral gyrus extending into the corona radiata and more anterior frontal lobe. 2. Moderate atrophy and white matter disease likely reflects the sequela of chronic microvascular ischemia. 3. Remote lacunar infarct of the left thalamus. These results were called by telephone at the time of interpretation on 09/14/2021 at 6:27 pm to provider [facility] , who verbally acknowledged these results. Electronically Signed: By: San Morelle M.D. On: 09/14/2021 18:30      Blood pressure (!) 184/88, pulse (!) 54, temperature 98.8 F (37.1 C), temperature source Oral, resp. rate 15, height 5' 5"  (1.651 m), weight 84.8 kg, SpO2 100 %.  Medical Problem List and Plan: 1. 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  Recommendations of 30-day monitor  -patient may shower  -ELOS/Goals: 14 days, supervision goals 2.  Antithrombotics: -DVT/anticoagulation:  Mechanical: Antiembolism stockings, thigh (TED hose) Bilateral lower extremities  -antiplatelet therapy: Plavix 75 mg daily only.  DAPT held due to chronic anemia 3. Pain Management: N/A 4. Mood/Behavior/Sleep: Provide emotional support  -antipsychotic agents: N/A 5. Neuropsych/cognition: This patient is capable of making decisions on his own behalf. 6. Skin/Wound Care: Routine skin checks 7. Fluids/Electrolytes/Nutrition: Routine in and outs with follow-up chemistries 8.  Hypertension.  Norvasc 10 mg daily,  clonidine patch 0.3 mg weekly, hydralazine 25 mg twice daily, Isordil 20 mg daily, Lopressor 100 mg twice daily.   -Monitor with increased physical activity. 9.  End-stage renal disease.  Hemodialysis as directed per renal services  -HD at end of day to allow participation in therapies 10.  Hyperlipidemia.  Crestor 11.  Nonalcoholic cirrhosis with ascites.  Paracentesis 2014-2015. 12.  History of prostate cancer.  Followed outpatient by Dr. Gloriann Loan and recently completed radiation therapy.  Continue Flomax 13.  History of first-degree AV block.  No chest pain or shortness of breath 14.  Tobacco abuse.  Counseling 15.  COPD.  OSA.  Patient no longer on CPAP. 16.  Chronic anemia.  Plan IV iron weekly with ferric per hemodialysis   Cathlyn Parsons, PA-C 09/16/2021

## 2021-09-16 NOTE — Progress Notes (Signed)
Patient ID: James Wall, male   DOB: 1965-10-05, 56 y.o.   MRN: [id number]  INPATIENT REHABILITATION ADMISSION NOTE   Arrival Method: bed     Mental Orientation: x4   Assessment: see flowsheet   Skin: see flowsheet   IV'S: see flowsheet   Pain: see MAR   Tubes and Drains: n/a   Safety Measures: in place   Vital Signs: see flowsheet   Height and Weight: see flowsheet   Rehab Orientation: completed   Family: notified    Notes:   Dorthula Nettles, Chestine Spore, BSN, CBIS, CRRN, [place]

## 2021-09-16 NOTE — Discharge Instructions (Signed)
Dear James Wall,   Thank you so much for allowing [place] to be part of your care!  You were admitted to [facility] for stroke and saw neurology.   We gave you dialysis while inpatient and monitored your blood levels, gave blood when needed   Continue with your home blood pressure medications   POST-HOSPITAL & CARE INSTRUCTIONS Please follow up with [facility] for a monitor  Please let PCP/Specialists know of any changes that were made.  Please see medications section of this packet for any medication changes.   DOCTOR'S APPOINTMENT & FOLLOW UP CARE INSTRUCTIONS  Future Appointments  Date Time Provider [place]  09/26/2021  9:30 AM Jacelyn Grip, MD [facility]  10/15/2021  9:45 AM Lazaro Arms, RN [facility] None  10/17/2021  9:30 AM Bruning, Ashlyn, PA-C [facility] None  10/22/2021  9:00 AM Werner Lean, MD [facility]    RETURN PRECAUTIONS:   Take care and be well!  [facility]  [place], [place] [address] [phone]

## 2021-09-16 NOTE — TOC Transition Note (Signed)
Transition of Care [facility]) - CM/SW Discharge Note   Patient Details  Name: James Wall MRN: [id number] Date of Birth: [birth year]  [facility] CM/SW Contact:  Pollie Friar, RN Phone Number: 09/16/2021, 12:29 PM   Clinical Narrative:    Pt discharging to CIR today. CM signing off.   Final next level of care: IP Rehab Facility Barriers to Discharge: No Barriers Identified   Patient Goals and CMS Choice     Choice offered to / list presented to : Patient  Discharge Placement                       Discharge Plan and Services                                     Social Determinants of Health (SDOH) Interventions     Readmission Risk Interventions     No data to display

## 2021-09-16 NOTE — Progress Notes (Addendum)
Daily Progress Note Intern Pager: [phone]  Patient name: James James Wall [facility] Medical record number: [id number] Date of birth: 07-Jun-1965 Age: 56 y.o. Gender: male  Primary Care Provider: Wells Guiles, DO Consultants: neurology, nephrology, PT/OT, PM&R, SLP Code Status: FULL  Pt Overview and Major Events to Date:  - 8/12 admitted  Assessment and Plan:  James James Wall is a 56 y.o. male admitted for acute non-hemorrhagic infarct who presented outside of the acute intervention window, to be transferred to CIR, pending dialysis and blood transfusion. Pertinent PMH/PSH includes ESRD on HD, anemia of renal disease, prostate cancer s/p radiation, HTN.   * Stroke [facility]) Unfortunately, out of window for acute intervention. MRI shows acute nonhemorrhagic infarct involving left precentral gyrus extending into corona radiata and more anterior frontal lobe in addition to remote lacunar infarct of left thalamus. -Neurology following, appreciate recommendations, patient started on Plavix -Neurochecks every 4 hours, vital signs per unit, continuous cardiac monitoring x24 hours -SLP, PT, OT -Not requiring permissive hypertensive measures -Requires 30 day heart monitor on d/c  ESRD on dialysis [facility]) Dialysis MWF, went to dialysis on Friday.  Anuric with exception of few drops of urine.  Home medications include Renvela 3 times daily.  Electrolytes stable, no signs of uremia. Not requiring emergent dialysis at this time. -Continue Renvela -Nephrology consulted, recs appreciated  Anemia of renal disease Chronic anemia with macrocytic component.  No signs of blood loss or symptoms of anemia. Not on EPO agents according to medication list, likely relating to most recently being treated with radiation. Hgb remains unchanged at 6.8. -Monitor CBC -Hb corrected to 7.7 today, cancelled PRBC transfusion  Essential hypertension Hypertensive up to 163/95.  Medication reconciliation information  difficult to obtain during admission.  Potential home medications include amlodipine 10 mg daily, clonidine 0.3 mg patch weekly, hydralazine 25 mg twice daily, Isordil 20 mg twice daily, metoprolol tartrate 100 mg to take after dialysis. -Continue amlodipine 10 mg daily, clonidine 0.3 mg weekly, hydralazine 25 mg twice daily, metoprolol tartrate 100 mg twice daily, Isordil 20 mg BID -per neurology permissive hypertension (OK if < 220/120) but gradually normalize in 5-7 days   FEN/James Wall: renal/carb modified diet PPx: SCDs Dispo:Transfer to CIR  pending clinical improvement . Barriers include ongoing acute medical issues.   Subjective:  Patient says he did not sleep well, woke up at 4am and couldn't go back to sleep which he attributes to being in a different environment.  Objective: Temp:  [97.9 F (36.6 C)-98.8 F (37.1 C)] 98.8 F (37.1 C) (08/14 0435) Pulse Rate:  [54-56] 54 (08/14 0435) Resp:  [10-19] 15 (08/14 0435) BP: (141-184)/(74-100) 184/88 (08/14 0435) SpO2:  [94 %-100 %] 100 % (08/14 0435)  Physical Exam: General: well-appearing and in no acute distress HEENT: normocephalic and atraumatic Cardiovascular: regular rate Respiratory: CTAB anteriorly, normal respiratory effort, and on 2L [place] Gastrointestinal: non-tender, non-distended, and no rebound tenderness or guarding Extremities: moving all extremities spontaneously Neuro: following commands and no focal neurological deficits   Laboratory: Most recent CBC Lab Results  Component Value Date   WBC 3.4 (L) 09/16/2021   HGB 7.7 (L) 09/16/2021   HCT 22.8 (L) 09/16/2021   MCV 102.2 (H) 09/16/2021   PLT 151 09/16/2021   Most recent BMP    Latest Ref Rng & Units 09/15/2021    3:29 AM  BMP  Glucose 70 - 99 mg/dL 95   BUN 6 - 20 mg/dL 25   Creatinine 0.61 - 1.24 mg/dL 8.86  Sodium 135 - 145 mmol/L 138   Potassium 3.5 - 5.1 mmol/L 4.0   Chloride 98 - 111 mmol/L 101   CO2 22 - 32 mmol/L 26   Calcium 8.9 - 10.3 mg/dL  9.8    Imaging: Echo 8/13  1. Left ventricular ejection fraction, by estimation, is 55 to 60%. The left ventricle has normal function. The left ventricle has no regional James Wall motion abnormalities. There is moderate concentric left ventricular hypertrophy. Left ventricular diastolic parameters are consistent with Grade III diastolic dysfunction (restrictive). Elevated left atrial pressure.   2. Right ventricular systolic function is normal. The right ventricular  size is normal. There is severely elevated pulmonary artery systolic  pressure. The estimated right ventricular systolic pressure is 37.2 mmHg.   3. Left atrial size was severely dilated.   4. The mitral valve is normal in structure. Mild to moderate mitral valve regurgitation. No evidence of mitral stenosis.   5. Tricuspid valve regurgitation is mild to moderate.   6. The aortic valve is tricuspid. Aortic valve regurgitation is not  visualized. No aortic stenosis is present.   7. The inferior vena cava is dilated in size with <50% respiratory  variability, suggesting right atrial pressure of 15 mmHg.   Comparison(s): RVSP is worse than prior; stable LV function.   Camelia Phenes, MD 09/16/2021, 11:53 AM  PGY-1, [place] Intern pager: [phone], text pages welcome Secure chat group [place]

## 2021-09-16 NOTE — Progress Notes (Signed)
PMR Admission Coordinator Pre-Admission Assessment   Patient: James Wall is an 56 y.o., male MRN: [id number] DOB: 14-Dec-1965 Height: _0  (165.1 cm) Weight: 84.8 kg   Insurance Information HMO:     PPO:      PCP:      IPA:      80/20:      OTHER:  PRIMARY: Medicare A & B      Policy#: [id number]      Subscriber: Patient CM Name:       Phone#:      Fax#:  Pre-Cert#:  Verified through passport onesource     Employer:  Benefits:  Phone #:      Name:  Eff. Date: 05/05/2007     Deduct: $1600      Out of Pocket Max:       Life Max:  CIR:  100%     SNF: 20 full days Outpatient:  80%    Co-Pay: 20% Home Health:  100%     Co-Pay:  DME:  80%    Co-Pay: 20% Providers:  SECONDARY: BCBS      Policy#: [id number]     Phone#:    Financial Counselor:       Phone#:    The Engineer, [employer]" for patients in Inpatient Rehabilitation Facilities with attached "Privacy Act [place] Records" was provided and verbally reviewed with: Patient and Family   Emergency Contact Information Contact Information       Name Relation Home Work [place] [phone]   [phone]    Chryl Heck [phone]   [phone]           Current Medical History  Patient Admitting Diagnosis: CVA History of Present Illness: James Wall is a 56 year old right-handed male with history of chronic anemia, nonalcoholic cirrhosis as well as ascites with paracentesis 2014-2015, COPD/OSA no longer on CPAP, end-stage renal disease with hemodialysis since 2009, hypertension, prostate cancer followed by Dr. Gloriann Loan, first-degree AV block, hypertension, hyperlipidemia, tobacco use.  Per chart review patient lives alone.  1 level home one-step to entry.  He takes SCAT to hemodialysis.  He has a sister and friends to take him to the grocery store when needed.  Presented 09/14/2021 with with left arm weakness as well as dysarthric speech 09/11/2021 and patient  did not initially seek medical attention.  He was having a hard time holding his utensils.  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  Remote lacunar infarct of the left thalamus.  CT angiogram head and neck negative for large vessel occlusion.  Bulky calcified plaque about the right carotid bulb/proximal right ICA with associated stenosis of up to 40%.  Admission chemistries unremarkable except BUN 23, creatinine 7.52, hemoglobin 6.8, alcohol negative.  Echocardiogram with ejection fraction of 55 to 60% no wall motion abnormality grade 3 diastolic dysfunction.  Patient did not receive tPA.  Neurology follow-up currently maintained on Plavix for CVA prophylaxis.  DAPT held due to chronic anemia as well as reported intolerance to aspirin.  Renal service follow-up with hemodialysis ongoing as directed.  Tolerating a regular consistency diet.  Therapy evaluations completed due to patient's left-sided weakness dysarthric speech was recommended for a comprehensive rehab program.   Complete NIHSS TOTAL: 8   [facility]'s medical record from [facility] has been reviewed by the rehabilitation admission coordinator and physician.   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;  dialysis at [facility] on MWF   First degree heart block     GERD (gastroesophageal reflux disease)     Hiatal  hernia     History of acute respiratory failure 07/2013    admission;  HCAP w/ ARF with hypoxia   History of adenomatous polyp of colon     History of ascites      s/p paracentesis 01-31-2013 (5L) and last one 03-28-2013 (2.7L)   History of community acquired pneumonia 08/08/2020    admission ; LLL , POA   History of heart murmur in childhood     History of MRSA infection 12/19/2012    hospital admission;  w/ sepsis MRSA bacteramia AVF infection   History of sepsis 03/2017    admission;   HCAP w/ sepsis   Hyperlipidemia     Hypertension     Hypertensive heart disease      cardiologist--- dr Osborne Oman;  nuclear stress test 06-16-2013 intermediate risk w/ mid-distal anterior wall ischemia , not gated;  cardiac cath 07-13-2013 in epic showed normal coronaries and LVSF,  ef not assessed, LCEDP 54mHg   Hypertensive nephrosclerosis, 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 ([place]     Intolerance to cold      due to anemia   Malignant neoplasm prostate ([facility]      urologist--- dr bell/  radiation onologist--- dr mTammi Klippel  dx 01/ 2023,  Gleason 4+3, PSA 321  NICM (nonischemic cardiomyopathy) ([place]      followed by cardiology;   last echo in epic 09-13-2020 ef 55-60%   OSA (obstructive sleep apnea) 2009    no  longer using cpap since the yr started 2009;   sleep study in epic 05-11-2007 severe osa   PSVT ([place]oxysmal [place]ventricular tachycardia) ([place]      event monitor 08-01-2019  SR w/ SVT runs , rare PAC/ PVC   [place]     Wears glasses        Has the patient had major surgery during 100 days prior to admission? No   Family History   family history includes Asthma in his brother; Cerebrovascular Accident in his father; Congestive Heart Failure in his brother; Hypertension in his father and mother.   Current Medications   Current Facility-Administered Medications:    amLODipine (NORVASC) tablet 10 mg, 10 mg, Oral, QHS, Dahbura,  Anton, DO, 10 mg at 09/15/21 2146   Chlorhexidine Gluconate Cloth 2 % PADS 6 each, 6 each, Topical, Q0600, SRoney Jaffe MD, 6 each at 09/16/21 0604   cloNIDine (CATAPRES - Dosed in mg/24 hr) patch 0.3 mg, 0.3 mg, Transdermal, Q Sun, Dahbura, Anton, DO, 0.3 mg at 09/15/21 0910   clopidogrel (PLAVIX) tablet 75 mg, 75 mg, Oral, Daily, WBeulah GandyA, NP, 75 mg at 09/15/21 1156  isosorbide dinitrate (ISORDIL) tablet 20 mg, 20 mg, Oral, BID, Maxwell, Allee, MD   metoprolol tartrate (LOPRESSOR) tablet 100 mg, 100 mg, Oral, BID, Dahbura, Anton, DO, 100 mg at 09/15/21 2146   rosuvastatin (CRESTOR) tablet 10 mg, 10 mg, Oral, Daily, Espinoza, Alejandra, DO, 10 mg at 09/15/21 1156   sevelamer carbonate (RENVELA) tablet 3,200 mg, 3,200 mg, Oral, TID WC, Schertz, Robert, MD, 3,200 mg at 09/15/21 1731   sodium chloride flush (NS) 0.9 % injection 3 mL, 3 mL, Intravenous, Once, Dahbura, Anton, DO   tamsulosin (FLOMAX) capsule 0.4 mg, 0.4 mg, Oral, QPC supper, Dahbura, Anton, DO, 0.4 mg at 09/15/21 1731   umeclidinium-vilanterol (ANORO ELLIPTA) 62.5-25 MCG/ACT 1 puff, 1 puff, Inhalation, Daily, Maxwell, Allee, MD  Patients Current Diet:  Diet Order             Diet - low sodium heart healthy           Diet Carb Modified           Diet renal/carb modified with fluid restriction Diet-HS Snack? Nothing; Fluid restriction: 1200 mL Fluid; Room service appropriate? Yes; Fluid consistency: Thin  Diet effective now                   Precautions / Restrictions Precautions Precautions: Fall Restrictions Weight Bearing Restrictions: No   Has the patient had 2 or more falls or a fall with injury in the past year? No  Prior Activity  Level Limited Community (1-2x/wk): used cane occasionally, does not drive Community (5-7x/wk): goes to HD 3x per week via transport  Prior Functional Level Self Care: Did the patient need help bathing, dressing, using the toilet or eating? Independent  Indoor Mobility: Did the patient need assistance with walking from room to room (with or without device)? Independent  Stairs: Did the patient need assistance with internal or external stairs (with or without device)? Independent  Functional Cognition: Did the patient need help planning regular tasks such as shopping or remembering to take medications? Independent  Patient Information Are you of Hispanic, Latino/a,or Spanish origin?: A. No, not of Hispanic, Latino/a, or Spanish origin What is your race?: B. Black or African American Do you need or want an interpreter to communicate with a doctor or health care staff?: 0. No  Patient's Response To:  Health Literacy and Transportation Is the patient able to respond to health literacy and transportation needs?: Yes Health Literacy - How often do you need to have someone help you when you read instructions, pamphlets, or other written material from your doctor or pharmacy?: Never In the past 12 months, has lack of transportation kept you from medical appointments or from getting medications?: No In the past 12 months, has lack of transportation kept you from meetings, work, or from getting things needed for daily living?: No  Home Assistive Devices / Equipment Home Assistive Devices/Equipment: Eyeglasses Home Equipment: Cane - single point  Prior Device Use: Indicate devices/aids used by the patient prior to current illness, exacerbation or injury? None of the above  Current Functional Level Cognition  Arousal/Alertness: Awake/alert Overall Cognitive Status: Impaired/Different from baseline Current Attention Level: Selective Orientation Level: Oriented X4 Safety/Judgement: Decreased  awareness of safety, Decreased awareness of deficits General Comments: tangential speech, hyperverbose Memory: Appears intact Awareness: Appears intact Problem Solving: Appears intact Safety/Judgment: Appears intact    Extremity Assessment (includes Sensation/Coordination)  Upper Extremity Assessment: Generalized weakness LUE Deficits / Details: minimal movement into flxor pattern at shoulder and elbow; no   prior to current illness, exacerbation or injury? None of the above   Current Functional Level Cognition   Arousal/Alertness: Awake/alert Overall Cognitive Status: Impaired/Different from baseline Current  Attention Level: Selective Orientation Level: Oriented X4 Safety/Judgement: Decreased awareness of safety, Decreased awareness of deficits General Comments: tangential speech, hyperverbose Memory: Appears intact Awareness: Appears intact Problem Solving: Appears intact Safety/Judgment: Appears intact    Extremity Assessment (includes Sensation/Coordination)   Upper Extremity Assessment: Generalized weakness LUE Deficits / Details: minimal movement into flxor pattern at shoulder and elbow; no movement noted wrist/hand; non-functional LUE Sensation: decreased light touch, decreased proprioception LUE Coordination: decreased fine motor, decreased gross motor  Lower Extremity Assessment: LLE deficits/detail LLE Deficits / Details: Hip flexion 2/5, knee extension 3+/5, knee flexion 3/5; ankle dorsiflexion 3/5; Difficulty with hip flexion against gravity, better in gravity eliminated LLE Coordination: decreased gross motor     ADLs   Overall ADL's : Needs assistance/impaired Grooming: Minimal assistance Upper Body Bathing: Minimal assistance Upper Body Bathing Details (indicate cue type and reason): simulated seated Lower Body Bathing: Moderate assistance, Sitting/lateral leans Lower Body Bathing Details (indicate cue type and reason): simulated Upper Body Dressing : Moderate assistance Lower Body Dressing: Maximal assistance, Bed level Functional mobility during ADLs: Moderate assistance, Minimal assistance, Cueing for safety, Rolling walker (2 wheels)     Mobility   Overal bed mobility: Needs Assistance Bed Mobility: Supine to Sit Supine to sit: Mod assist General bed mobility comments: pt up in chair upon OT arrival     Transfers   Overall transfer level: Needs assistance Equipment used: Rolling walker (2 wheels) Transfers: Sit to/from Stand, Bed to chair/wheelchair/BSC Sit to Stand: Mod assist, Min assist Bed to/from chair/wheelchair/BSC transfer type:: Step pivot Step pivot  transfers: Min assist General transfer comment: pt strongly desires to do things independently but ultimately requiring minA to power up and stand, modA for weight shifting and advancement of  L LE during step pvt to EOB     Ambulation / Gait / Stairs / Wheelchair Mobility   Ambulation/Gait Ambulation/Gait assistance: Mod assist Gait Distance (Feet): 15 Feet Assistive device: Rolling walker (2 wheels) Gait Pattern/deviations: Decreased stance time - left, Decreased dorsiflexion - left, Knee flexed in stance - left General Gait Details: requiring mod A for ambulation Gait velocity: decreased Gait velocity interpretation: <1.8 ft/sec, indicate of risk for recurrent falls     Posture / Balance Balance Overall balance assessment: Needs assistance Sitting-balance support: Feet supported, No upper extremity supported Sitting balance-Leahy Scale: Fair Standing balance support: During functional activity, Bilateral upper extremity supported Standing balance-Leahy Scale: Poor Standing balance comment: requires external assist     Special needs/care consideration HD M,W,F    Previous Home Environment (from acute therapy documentation) Living Arrangements: Alone  Lives With: Alone Available Help at Discharge: Family, Friend(s), Neighbor Type of Home: House Home Layout: One level Home Access: Stairs to enter Entrance Stairs-Rails: Right Entrance Stairs-Number of Steps: 1 Bathroom Shower/Tub: Chiropodist: Standard Bathroom Accessibility: Yes How Accessible: Accessible via walker   Discharge Living Setting Plans for Discharge Living Setting: Patient's home Type of Home at Discharge: House Discharge Home Layout: One level Discharge Home Access: Stairs to enter Entrance Stairs-Rails: Right Entrance Stairs-Number of Steps: 1 Discharge Bathroom Shower/Tub: Tub/shower unit Discharge Bathroom Toilet: Standard Does the patient have any problems obtaining your  medications?: No   Social/Family/Support Systems Anticipated Caregiver: Sister, Theresa/friend Gigi johnson Anticipated Ambulance person Information: [phone] Ability/Limitations of Caregiver: can provide supervision to min A Caregiver  Availability: Intermittent Discharge Plan Discussed with Primary Caregiver: Yes Is Caregiver In Agreement with Plan?: Yes Does Caregiver/Family have Issues with Lodging/Transportation while Pt is in Rehab?: No   Goals Patient/Family Goal for Rehab: Supervision/mod I for PT, OT, SLP Expected length of stay: 14 days Additional Information: patient has HD on M, W, F Pt/Family Agrees to Admission and willing to participate: Yes Program Orientation Provided & Reviewed with Pt/Caregiver Including Roles  & Responsibilities: Yes  Barriers to Discharge: Insurance for SNF coverage   Decrease burden of Care through IP rehab admission: OtherN/A   Possible need for SNF placement upon discharge: Not anticipated   Patient Condition: I have reviewed medical records from [facility], spoken with  [facility] , and patient and family member. I met with patient at the bedside for inpatient rehabilitation assessment.  Patient will benefit from ongoing PT, OT, and SLP, can actively participate in 3 hours of therapy a day 5 days of the week, and can make measurable gains during the admission.  Patient will also benefit from the coordinated team approach during an Inpatient Acute Rehabilitation admission.  The patient will receive intensive therapy as well as Rehabilitation physician, nursing, social worker, and care management interventions.  Due to safety, skin/wound care, disease management, medication administration, and patient education the patient requires 24 hour a day rehabilitation nursing.  The patient is currently mod A with mobility and basic ADLs.  Discharge setting and therapy post discharge at home with home health is anticipated.  Patient has agreed to  participate in the Acute Inpatient Rehabilitation Program and will admit today.   Preadmission Screen Completed By:  Nelly Laurence, 09/16/2021 1:07 PM ______________________________________________________________________   Discussed status with Dr. Naaman Plummer on 09/16/21 at 13:09 and received approval for admission today.   Admission Coordinator:  Nelly Laurence, time 13:09/Date 09/16/21    Assessment/Plan: Diagnosis: right frontal infarct Does the need for close, 24 hr/day Medical supervision in concert with the patient's rehab needs make it unreasonable for this patient to be served in a less intensive setting? Yes Co-Morbidities requiring supervision/potential complications: HTN, COPD, NA Cirrhosis, esrd hemo, prostate cancer Due to bladder management, bowel management, safety, skin/wound care, disease management, medication administration, pain management, and patient education, does the patient require 24 hr/day rehab nursing? Yes Does the patient require coordinated care of a physician, rehab nurse, PT, OT, and SLP to address physical and functional deficits in the context of the above medical diagnosis(es)? Yes Addressing deficits in the following areas: balance, endurance, locomotion, strength, transferring, bowel/bladder control, bathing, dressing, feeding, grooming, toileting, and psychosocial support Can the patient actively participate in an intensive therapy program of at least 3 hrs of therapy 5 days a week? Yes The potential for patient to make measurable gains while on inpatient rehab is excellent Anticipated functional outcomes upon discharge from inpatient rehab: supervision PT, supervision OT, supervision SLP Estimated rehab length of stay to reach the above functional goals is: 14 days Anticipated discharge destination: Home 10. Overall Rehab/Functional Prognosis: excellent     MD Signature: Meredith Staggers, MD, [place] Director Rehabilitation Services 09/16/2021

## 2021-09-16 NOTE — Progress Notes (Addendum)
[facility] Progress Note    Assessment/ Plan:   Subacute CVA -symptom onset 8/11, out of window for intervention. 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 Doing PT, up with walker. No complaints   Objective:   BP (!) 184/88 (BP Location: Right Arm)   Pulse (!) 54   Temp 98.8 F (37.1 C) (Oral)   Resp 15   Ht 5' 5"  (1.651 m)   Wt 84.8 kg   SpO2 100%   BMI 31.12 kg/m  No intake or output data in the 24 hours ending 09/16/21 1033 Weight change:   Physical Exam: Gen:NAD CVS:RRR Resp:normal WOB, CTA BL AUTM:LYYTEKPT:WSFKCankle edema b/l Neuro: walking with walker (w/ PT), left sided weakness noted [facility]: ECHOCARDIOGRAM COMPLETE  Result Date: 09/15/2021    ECHOCARDIOGRAM REPORT   Patient Name:   [facility] of [facility]am: 09/15/2021 Medical Rec #:  [id number]          Height:       65.0 in Accession #:    [id number]         Weight:       187.0 lb Date of Birth:  [birth year]          BSA:          1.922 m Patient Age:    89years            BP:           170/100 mmHg Patient Gender: M                   HR:           56 bpm. Exam Location:  Inpatient Procedure: 2D Echo Indications:    stroke  History:        Patient has prior history of Echocardiogram examinations, most                 recent 09/13/2020. COPD and end stage  renal disease,                 Arrythmias:first degree heart block; Risk Factors:Hypertension                 and Sleep Apnea.  Sonographer:    LJohny ChessRDCS Referring Phys: 1[place] 1. Left ventricular ejection fraction, by estimation, is 55 to 60%. The left ventricle has normal function. The left ventricle has no regional wall motion abnormalities. There is moderate concentric left ventricular hypertrophy. Left ventricular diastolic parameters are consistent with Grade III diastolic dysfunction (restrictive). Elevated left atrial pressure.  2. Right ventricular systolic function is normal. The right ventricular size is  normal. There is severely elevated pulmonary artery systolic pressure. The estimated right ventricular systolic pressure is 93.2 mmHg.  3. Left atrial size was severely dilated.  4. The mitral valve is normal in structure. Mild to moderate mitral valve regurgitation. No evidence of mitral stenosis.  5. Tricuspid valve regurgitation is mild to moderate.  6. The aortic valve is tricuspid. Aortic valve regurgitation is not visualized. No aortic stenosis is present.  7. The inferior vena cava is dilated in size with <50% respiratory variability, suggesting right atrial pressure of 15 mmHg. Comparison(s): RVSP is worse than prior; stable LV function. FINDINGS  Left Ventricle: Left ventricular ejection fraction, by estimation, is 55 to 60%. The left ventricle has normal function. The left ventricle has no regional wall motion abnormalities. The left ventricular internal cavity size was normal in size. There is  moderate concentric left ventricular hypertrophy. Left ventricular diastolic parameters are consistent with Grade III diastolic dysfunction (restrictive). Elevated left atrial pressure. Right Ventricle: The right ventricular size is normal. No increase in right ventricular wall thickness. Right ventricular systolic function is normal. There is severely elevated  pulmonary artery systolic pressure. The tricuspid regurgitant velocity is 4.36 m/s, and with an assumed right atrial pressure of 15 mmHg, the estimated right ventricular systolic pressure is 67.1 mmHg. Left Atrium: Left atrial size was severely dilated. Right Atrium: Right atrial size was normal in size. Pericardium: Trivial pericardial effusion is present. Mitral Valve: The mitral valve is normal in structure. Mild mitral annular calcification. Mild to moderate mitral valve regurgitation. No evidence of mitral valve stenosis. Tricuspid Valve: The tricuspid valve is normal in structure. Tricuspid valve regurgitation is mild to moderate. No evidence of tricuspid stenosis. Aortic Valve: The aortic valve is tricuspid. Aortic valve regurgitation is not visualized. No aortic stenosis is present. Pulmonic Valve: The pulmonic valve was normal in structure. Pulmonic valve regurgitation is not visualized. No evidence of pulmonic stenosis. Aorta: The aortic root and ascending aorta are structurally normal, with no evidence of dilitation. Venous: The inferior vena cava is dilated in size with less than 50% respiratory variability, suggesting right atrial pressure of 15 mmHg. IAS/Shunts: No atrial level shunt detected by color flow Doppler.  LEFT VENTRICLE PLAX 2D LVIDd:         5.30 cm Diastology LVIDs:         3.70 cm LV e' medial:    5.33 cm/s LV PW:         1.40 cm LV E/e' medial:  23.8 LV IVS:        1.20 cm LV e' lateral:   4.35 cm/s                        LV E/e' lateral: 29.2  RIGHT VENTRICLE             IVC RV S prime:     11.90 cm/s  IVC diam: 2.40 cm TAPSE (M-mode): 1.8 cm LEFT ATRIUM              Index        RIGHT ATRIUM           Index LA diam:        4.80 cm  2.50 cm/m   RA Area:     16.20 cm LA Vol (A2C):   100.0 ml 52.02 ml/m  RA Volume:   41.90 ml  21.80 ml/m LA Vol (A4C):   113.0 ml 58.78 ml/m LA Biplane Vol: 106.0  ml 55.14 ml/m  AORTIC VALVE LVOT Vmax:   94.00 cm/s LVOT Vmean:  59.500 cm/s LVOT VTI:     0.209 m  AORTA Ao Asc diam: 3.20 cm MITRAL VALVE                TRICUSPID VALVE MV Area (PHT): 3.42 cm     TR Peak grad:   76.0 mmHg MV Decel Time: 222 msec     TR Vmax:        436.00 cm/s MR Peak grad: 108.6 mmHg MR Mean grad: 77.0 mmHg     SHUNTS MR Vmax:      521.00 cm/s   Systemic VTI: 0.21 m MR Vmean:     416.0 cm/s MV E velocity: 127.00 cm/s MV A velocity: 59.50 cm/s MV E/A ratio:  2.13 Rudean Haskell MD Electronically signed by Rudean Haskell MD Signature Date/Time: 09/15/2021/2:42:29 PM    Final    CT ANGIO HEAD NECK W WO CM  Result Date: 09/14/2021 CLINICAL DATA:  Follow-up examination for stroke. EXAM: CT ANGIOGRAPHY HEAD AND NECK TECHNIQUE: Multidetector CT imaging of the head and neck was performed using the standard protocol during bolus administration of intravenous contrast. Multiplanar CT image reconstructions and MIPs were obtained to evaluate the vascular anatomy. Carotid stenosis measurements (when applicable) are obtained utilizing NASCET criteria, using the distal internal carotid diameter as the denominator. RADIATION DOSE REDUCTION: This exam was performed according to the departmental dose-optimization program which includes automated exposure control, adjustment of the mA and/or kV according to patient size and/or use of iterative reconstruction technique. CONTRAST:  134m OMNIPAQUE IOHEXOL 350 MG/ML SOLN COMPARISON:  MRI from earlier the same day. FINDINGS: CT HEAD FINDINGS Brain: Atrophy with moderate chronic microvascular ischemic disease. Few identified right frontal infarct better seen on prior MRI. No other acute large vessel territory infarct. No intracranial hemorrhage. No mass lesion, midline shift or mass effect. No hydrocephalus or extra-axial fluid collection. Vascular: No hyperdense vessel. Calcified atherosclerosis present at skull base. Skull: Scalp soft tissues and calvarium within normal limits. Sinuses/Orbits: Globes orbital soft tissues within normal  limits. Paranasal sinuses and mastoid air cells are largely clear. Other: None. Review of the MIP images confirms the above findings CTA NECK FINDINGS Aortic arch: Visualized aortic arch normal caliber with standard branch pattern. Moderate atheromatous change about the arch itself. No high-grade stenosis about the origin the great vessels. Right carotid system: Bulky calcified plaque about the right carotid bulb/proximal right ICA with associated stenosis of up to 40% by NASCET criteria. No dissection or vascular occlusion. Left carotid system: Or few calcified plaque about the left carotid bulb/proximal left ICA without hemodynamically significant stenosis. No dissection or vascular occlusion. Vertebral arteries: Left vertebral artery arises directly from the aortic arch. Right vertebral artery dominant. Extensive plaque about the proximal right vertebral artery with associated up to severe stenosis at the pre foraminal right V1 segment (a series 13, image 252). Vertebral arteries otherwise patent without stenosis or dissection. Skeleton: No visible discrete or worrisome osseous lesions. Moderate spondylosis present at C5-6. Other neck: Multiple prominent lymph nodes seen within the lower left neck, measuring up to 1.1 cm at the left supraclavicular fossa. No other acute abnormality. Upper chest: Extensive mediastinal adenopathy is seen, largest of which measures up to 1.3 cm at prevascular space of the indeterminate. Layering bilateral pleural effusions with evidence of pulmonary interstitial edema within the visualized lungs, suggesting CHF. Right-sided central venous catheter in place. Review of the MIP images confirms the  above findings CTA HEAD FINDINGS Anterior circulation: Petrous segments patent bilaterally. Atheromatous change within the carotid siphons with up to moderate stenoses about the para clinoid segments bilaterally. A1 segments patent. Normal anterior communicating artery complex. Anterior  cerebral arteries patent without stenosis. No M1 stenosis or occlusion. No proximal MCA branch occlusion. Distal MCA branches perfused and symmetric right Posterior circulation: Dominant right V4 segment widely patent. Right PICA grossly patent at its origin. Hypoplastic left vertebral artery terminates in PICA. Left PICA patent as well. Basilar patent to its distal aspect without stenosis. Superior cerebellar and posterior cerebral arteries patent bilaterally. Venous sinuses: Patent allowing for timing the contrast bolus. Anatomic variants: Hypoplastic left vertebral artery terminates in PICA. No intracranial aneurysm. Review of the MIP images confirms the above findings IMPRESSION: CT HEAD IMPRESSION: 1. No acute intracranial abnormality by CT. Previously identified right MCA distribution infarct better seen on prior MRI. No intracranial hemorrhage. 2. Atrophy with moderate chronic microvascular ischemic disease. CTA HEAD AND NECK IMPRESSION: 1. Negative CTA for large vessel occlusion or other emergent finding. 2. Bulky calcified plaque about the right carotid bulb/proximal right ICA with associated stenosis of up to 40% by NASCET criteria. 3. Atheromatous change about the carotid siphons with up to moderate stenoses about the para clinoid segments bilaterally. 4. Extensive plaque about the proximal right vertebral artery with associated severe stenosis at the pre foraminal right V1 segment. Right vertebral artery dominant. Left vertebral artery hypoplastic and terminates in PICA, with no significant contribution to the posterior circulation. 5. Layering bilateral pleural effusions with pulmonary interstitial edema within the visualized lungs, suggesting CHF. 6. Prominent adenopathy within the lower left neck and visualized mediastinum. Findings are nonspecific, and could be reactive in nature. Possible nodal metastatic disease or lymphoproliferative disorder could also have this appearance. Electronically Signed    By: Jeannine Boga M.D.   On: 09/14/2021 23:48   MR BRAIN WO CONTRAST  Addendum Date: 09/14/2021   ADDENDUM REPORT: 09/14/2021 19:16 ADDENDUM: The acute infarct is in the right frontal lobe, not the left frontal lobe. Electronically Signed   By: San Morelle M.D.   On: 09/14/2021 19:16   Result Date: 09/14/2021 CLINICAL DATA:  Neuro deficit, acute, stroke suspected. Increased weakness, asymmetric on the left since Wednesday. EXAM: MRI HEAD WITHOUT CONTRAST TECHNIQUE: Multiplanar, multiecho pulse sequences of the brain and surrounding structures were obtained without intravenous contrast. COMPARISON:  Iron infusion yesterday. FINDINGS: Brain: Acute nonhemorrhagic infarct is demonstrated in the left precentral gyrus extending into the corona radiata. There is some involvement of more anterior frontal lobe as well. T2 and FLAIR changes are associated. Moderate atrophy and scattered periventricular subcortical T2 hyperintensities are present bilaterally separate from the area of acute/subacute infarct. Dilated perivascular spaces are present throughout the basal ganglia. The ventricles are of normal size. Remote lacunar infarct is present in the left thalamus. The internal auditory canals are within normal limits. The brainstem and cerebellum are within normal limits. Vascular: Flow is present in the major intracranial arteries. Skull and upper cervical spine: The craniocervical junction is normal. Upper cervical spine is within normal limits. Marrow signal is unremarkable. Sinuses/Orbits: The paranasal sinuses and mastoid air cells are clear. The globes and orbits are within normal limits. IMPRESSION: 1. Acute nonhemorrhagic infarct involving the left precentral gyrus extending into the corona radiata and more anterior frontal lobe. 2. Moderate atrophy and white matter disease likely reflects the sequela of chronic microvascular ischemia. 3. Remote lacunar infarct of the left thalamus. These  results were called by telephone at the time of interpretation on 09/14/2021 at 6:27 pm to provider [facility] , who verbally acknowledged these results. Electronically Signed: By: San Morelle M.D. On: 09/14/2021 18:30    Labs: BMET Recent Labs  Lab 09/14/21 1323 09/15/21 0329  NA 139 138  K 3.6 4.0  CL 102 101  CO2 29 26  GLUCOSE 95 95  BUN 23* 25*  CREATININE 7.52* 8.86*  CALCIUM 10.1 9.8   CBC Recent Labs  Lab 09/14/21 1323 09/15/21 0330 09/16/21 0750  WBC 3.0* 2.7* 3.4*  NEUTROABS 2.0 1.8  --   HGB 6.8* 6.8* 7.7*  HCT 20.0* 21.3* 22.8*  MCV 102.6* 105.4* 102.2*  PLT 137* 135* 151    Medications:     amLODipine  10 mg Oral QHS   Chlorhexidine Gluconate Cloth  6 each Topical Q0600   cloNIDine  0.3 mg Transdermal Q Sun   clopidogrel  75 mg Oral Daily   hydrALAZINE  25 mg Oral BID   isosorbide dinitrate  20 mg Oral Daily   metoprolol tartrate  100 mg Oral BID   rosuvastatin  10 mg Oral Daily   sevelamer carbonate  3,200 mg Oral TID WC   sodium chloride flush  3 mL Intravenous Once   tamsulosin  0.4 mg Oral QPC supper      Gean Quint, MD [facility] 09/16/2021, 10:33 AM

## 2021-09-16 NOTE — H&P (Signed)
Physical Medicine and Rehabilitation Admission H&P        Chief Complaint  Patient presents with   Weakness  : HPI: James Wall is a 56 year old right-handed male with history of chronic anemia, nonalcoholic cirrhosis as well as ascites with paracentesis 2014-2015, COPD/OSA no longer on CPAP, end-stage renal disease with hemodialysis since 2009, hypertension, prostate cancer followed by Dr. Gloriann Loan, first-degree AV block, hypertension, hyperlipidemia, tobacco use.  Per chart review patient lives alone.  1 level home one-step to entry.  He takes SCAT to hemodialysis.  He has a sister and friends to take him to the grocery store when needed.  Presented 09/14/2021 with with left arm weakness as well as dysarthric speech 09/11/2021 and patient did not initially seek medical attention.  He was having a hard time holding his utensils.  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  Remote lacunar infarct of the left thalamus.  CT angiogram head and neck negative for large vessel occlusion.  Bulky calcified plaque about the right carotid bulb/proximal right ICA with associated stenosis of up to 40%.  Admission chemistries unremarkable except BUN 23, creatinine 7.52, hemoglobin 6.8, alcohol negative.  Echocardiogram with ejection fraction of 55 to 60% no wall motion abnormality grade 3 diastolic dysfunction.  Patient did not receive tPA.  Neurology follow-up currently maintained on Plavix for CVA prophylaxis.  DAPT held due to chronic anemia as well as reported intolerance to aspirin.  Renal service follow-up with hemodialysis ongoing as directed.  Tolerating a regular consistency diet.  Therapy evaluations completed due to patient's left-sided weakness dysarthric speech was admitted for a comprehensive rehab program.   Review of Systems  Constitutional:  Negative for chills and fever.  HENT:  Negative for  hearing loss.   Eyes:  Negative for blurred vision and double vision.  Respiratory:  Negative for cough.        Shortness of breath with exertion  Cardiovascular:  Positive for leg swelling. Negative for chest pain and palpitations.  Gastrointestinal:  Positive for constipation.       GERD  Genitourinary:  Negative for dysuria, flank pain and hematuria.  Musculoskeletal:  Positive for joint pain and myalgias.  Skin:  Negative for rash.  Neurological:  Positive for speech change, weakness and headaches.  Psychiatric/Behavioral:  Positive for depression.        Anxiety  All other systems reviewed and are negative.       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 [facility])      primary cancer is prostate   Anemia associated with chronic renal failure     Anxiety     Arthritis     Cirrhosis, nonalcoholic ([place])      per pt possible from a medication , unsure ;   last ultrasound 08-09-2020 in epic no fibrosis   COPD (chronic obstructive pulmonary disease) ([place])     Depression     ESRD on hemodialysis ([place]) 2009    Started HD Jan 2009;  ESRD secondary to hypertensive nephrosclerosis;  dialysis at [facility] on MWF   First degree heart block     GERD (gastroesophageal reflux disease)     Hiatal hernia     History of acute respiratory failure 07/2013    admission;  HCAP w/ ARF with hypoxia   History of adenomatous  polyp of colon     History of ascites      s/p paracentesis 01-31-2013 (5L) and last one 03-28-2013 (2.7L)   History of community acquired pneumonia 08/08/2020    admission ; LLL , POA   History of heart murmur in childhood     History of MRSA infection 12/19/2012    hospital admission;  w/ sepsis MRSA bacteramia AVF infection   History of sepsis 03/2017    admission;   HCAP w/ sepsis   Hyperlipidemia     Hypertension     Hypertensive heart disease      cardiologist--- dr Osborne Oman;  nuclear stress test 06-16-2013 intermediate risk w/ mid-distal anterior wall ischemia , not gated;  cardiac cath 07-13-2013 in epic showed normal coronaries and LVSF,  ef not assessed, LCEDP 31mHg   Hypertensive nephrosclerosis, 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 ([place]     Intolerance to cold      due to anemia   Malignant neoplasm prostate ([facility]      urologist--- dr bell/  radiation onologist--- dr mTammi Klippel  dx 01/ 2023,  Gleason 4+3, PSA 381  NICM (nonischemic cardiomyopathy) ([place]      followed by cardiology;   last echo in epic 09-13-2020 ef 55-60%   OSA (obstructive sleep apnea) 2009    no  longer using cpap since the yr started 2009;   sleep study in epic 05-11-2007 severe osa   PSVT (paroxysmal supraventricular tachycardia) ([place]      event monitor 08-01-2019  SR w/ SVT runs , rare PAC/ PVC   Secondary hyperparathyroidism ([place]     Wears glasses           Past Surgical History:  Procedure Laterality Date   AV FISTULA PLACEMENT Right 02/22/2013    Procedure:  CREATION  OF BRACHIAL CEPHALIC FISTULA RIGHT ARM;  Surgeon: CElam Dutch MD;  [place] [facility]  Service: Vascular;  Laterality: Right;   AV FISTULA PLACEMENT Left 08/10/2014    Procedure: BASILIC VEIN TRANSPOSITION  ARTERIOVENOUS (AV) FISTULA CREATION LEFT UPPER ARM;  Surgeon: JMal Misty MD;  [place] [facility]  Service: Vascular;  Laterality: Left;   AV FISTULA PLACEMENT, RADIOCEPHALIC Left [id number]/8768   @[facility] ;  Left lower arm   COLONOSCOPY   11/30/2018    by dr bTarri Glenn  ESOPHAGOGASTRODUODENOSCOPY (EGD) WITH PROPOFOL N/A 04/12/2013    Procedure: ESOPHAGOGASTRODUODENOSCOPY (EGD) WITH PROPOFOL;  Surgeon: WArta Silence MD;  [facility];  Service: Endoscopy;  Laterality: N/A;   GOLD SEED IMPLANT N/A 06/11/2021    Procedure: GOLD SEED IMPLANT;  Surgeon: GJanith Lima MD;  [facility];  Service: Urology;  Laterality: N/A;   INSERTION OF DIALYSIS CATHETER N/A  12/23/2012    Procedure: INSERTION OF DIALYSIS CATHETER; ULTRASOUND GUIDED;  Surgeon: CAngelia Mould MD;  [place] [facility]  Service: Vascular;  Laterality: N/A;   INSERTION OF DIALYSIS CATHETER   10/22/2015    Right IJ non-tunneled HD catheter, placed again in 2019   IR FLUORO GUIDE CV LINE RIGHT   08/18/2019   IR FLUORO GUIDE CV LINE RIGHT   10/07/2019   IR GENERIC HISTORICAL   10/22/2015    IR [place] VASC ACCESS RIGHT 10/22/2015 [facility]   IR GENERIC HISTORICAL   10/22/2015    IR FLUORO GUIDE CV LINE RIGHT 10/22/2015 [facility]   IR GENERIC HISTORICAL   10/23/2015    IR FLUORO GUIDE CV LINE  RIGHT 10/23/2015 Marybelle Killings, MD [facility]   LEFT HEART CATHETERIZATION WITH CORONARY ANGIOGRAM N/A 07/13/2013    Procedure: LEFT HEART CATHETERIZATION WITH CORONARY ANGIOGRAM;  Surgeon: Jettie Booze, MD;  Location: [facility] CATH LAB;  Service: Cardiovascular;  Laterality: N/A;   LIGATION OF ARTERIOVENOUS  FISTULA Left 12/22/2012    Procedure: LIGATION OF ARTERIOVENOUS  FISTULA;EXCISION OF LARGE ANEURYSMS;;  Surgeon: Elam Dutch, MD;  Location: [place];  Service: Vascular;  Laterality: Left;   SPACE OAR INSTILLATION N/A 06/11/2021    Procedure: SPACE OAR INSTILLATION;  Surgeon: Janith Lima, MD;  [facility];  Service: Urology;  Laterality: N/A;   UPPER GASTROINTESTINAL ENDOSCOPY   09/07/2018    by dr Tarri Glenn         Family History  Problem Relation Age of Onset   Hypertension Mother     Cerebrovascular Accident Father     Hypertension Father     Congestive Heart Failure Brother     Asthma Brother     Stomach cancer Neg Hx     Rectal cancer Neg Hx     Esophageal cancer Neg Hx     Colon cancer Neg Hx      Social History:  reports that he has been smoking cigarettes. He has a 8.50 pack-year smoking history. He has never used smokeless tobacco. He reports current alcohol use. He reports current drug use. Frequency: 4.00 times per week. Drug: Marijuana. Allergies:        Allergies  Allergen Reactions   Venofer  [Ferric Oxide] Itching   Aspirin Other (See Comments)      Avoids due to renal disease;  Made congestion worse            Medications Prior to Admission  Medication Sig Dispense Refill   acetaminophen (TYLENOL) 500 MG tablet Take 500 mg by mouth every 6 (six) hours as needed.       amLODipine (NORVASC) 10 MG tablet TAKE 1 TABLET(10 MG) BY MOUTH DAILY (Patient taking differently: Take 10 mg by mouth at bedtime.) 90 tablet 0   chlorproMAZINE (THORAZINE) 10 MG tablet Take 1 tablet (10 mg total) by mouth 3 (three) times daily as needed for hiccups. 9 tablet 0   cloNIDine (CATAPRES - DOSED IN MG/24 HR) 0.3 mg/24hr patch Place 0.3 mg onto the skin once a week. Apply on Sundays       hydrALAZINE (APRESOLINE) 25 MG tablet Take 1 tablet (25 mg total) by mouth 2 (two) times daily. (Patient taking differently: Take 25 mg by mouth 2 (two) times daily.) 180 tablet 3   hydrOXYzine (ATARAX/VISTARIL) 25 MG tablet Take 1 tablet (25 mg total) by mouth every 8 (eight) hours as needed for itching. Further refills need to come from your PCP. 30 tablet 3   Ipratropium-Albuterol (COMBIVENT) 20-100 MCG/ACT AERS respimat Inhale 1 puff into the lungs every 6 (six) hours as needed for wheezing. 4 g 0   isosorbide dinitrate (ISORDIL) 20 MG tablet TAKE 1 TABLET(20 MG) BY MOUTH TWICE DAILY (Patient taking differently: Take 20 mg by mouth at bedtime. TAKE 1 TABLET(20 MG) BY MOUTH TWICE DAILY) 180 tablet 3   metoprolol tartrate (LOPRESSOR) 100 MG tablet Take 1 tablet by mouth twice a day.  ON DIALYSIS DAYS, TAKE AFTER DIALYSIS. (Patient taking differently: Take 100 mg by mouth daily. Take 1 tablet by mouth twice a day.  ON DIALYSIS DAYS, TAKE AFTER DIALYSIS.) 180 tablet 3   omeprazole (PRILOSEC) 40 MG capsule Take 40  mg by mouth daily as needed (heartburn).       ondansetron (ZOFRAN) 8 MG tablet Take 8 mg by mouth every 8 (eight) hours as needed for nausea/vomiting.       oxybutynin  (DITROPAN XL) 10 MG 24 hr tablet Take 1 tablet (10 mg total) by mouth at bedtime. (Patient taking differently: Take 10 mg by mouth daily as needed (frequency).) 30 tablet 2   pantoprazole (PROTONIX) 40 MG tablet Take 1 tablet (40 mg total) by mouth 2 (two) times daily before a meal. (Patient taking differently: Take 40 mg by mouth 2 (two) times daily as needed (acid reflux).) 180 tablet 3   sevelamer carbonate (RENVELA) 800 MG tablet Take 1,600-3,200 mg by mouth See admin instructions. Take 2-4 tablets by mouth daily  with meals depending on meal size per patient       tamsulosin (FLOMAX) 0.4 MG CAPS capsule TAKE 2 CAPSULES(0.8 MG) BY MOUTH DAILY (Patient taking differently: Take 0.4 mg by mouth daily after supper.) 180 capsule 3   umeclidinium-vilanterol (ANORO ELLIPTA) 62.5-25 MCG/INH AEPB Inhale 1 puff into the lungs daily. (Patient taking differently: Inhale 1 puff into the lungs daily as needed (For shortness of breath).) 60 each 1          Home: Home Living Family/patient expects to be discharged to:: Private residence Living Arrangements: Alone Available Help at Discharge: Family, Friend(s) Type of Home: House Home Access: Stairs to enter Technical brewer of Steps: 1 Entrance Stairs-Rails: Right Home Layout: One level Bathroom Shower/Tub: Tub/shower unit, Architectural technologist: Standard Bathroom Accessibility: Yes Home Equipment: Radio producer - single point  Lives With: Alone   Functional History: Prior Function Prior Level of Function : Independent/Modified Independent (has license) Mobility Comments: sister or friends take him to the grocery store; takes SCAT to HD ADLs Comments: takes sink baths   Functional Status:  Mobility: Bed Mobility Overal bed mobility: Needs Assistance Bed Mobility: Supine to Sit Supine to sit: Mod assist General bed mobility comments: Able to move bil LEs to EOB, though slowly; Heavy mod assist to pull to sit and square off hips at edge of  stretcher Transfers Overall transfer level: Needs assistance Equipment used: 1 person hand held assist (and R UE stabilizing on bed side chest, locked) Transfers: Sit to/from Stand Sit to Stand: Mod assist, Min assist General transfer comment: Mod assist to steady and close guard/obs of L knee at initial stand; Dependent on RUE to stabilize; L knee weak, but did not buckle; Min assist, mostly steadying with 2 more reps of sit to stand Ambulation/Gait Ambulation/Gait assistance: Min assist Gait Distance (Feet): 12 Feet Assistive device: 1 person hand held assist Gait Pattern/deviations: Decreased stance time - left, Decreased dorsiflexion - left General Gait Details: Walked around stretcher with Handheld assist/support LUE; LLE with some difficulty clearing floor and advancing, leading to decr step length   ADL: ADL Overall ADL's : Needs assistance/impaired Grooming: Minimal assistance Upper Body Bathing: Minimal assistance Lower Body Bathing: Moderate assistance, Bed level Upper Body Dressing : Moderate assistance Lower Body Dressing: Maximal assistance, Bed level Functional mobility during ADLs:  (pt declined)   Cognition: Cognition Overall Cognitive Status: Impaired/Different from baseline Arousal/Alertness: Awake/alert Orientation Level: Oriented X4 Memory: Appears intact Awareness: Appears intact Problem Solving: Appears intact Safety/Judgment: Appears intact Cognition Arousal/Alertness: Awake/alert Behavior During Therapy: WFL for tasks assessed/performed (at times tearful) Overall Cognitive Status: Impaired/Different from baseline Area of Impairment: Attention, Safety/judgement, Awareness, Problem solving Current Attention Level: Selective Safety/Judgement: Decreased awareness of  safety, Decreased awareness of deficits Awareness: Emergent Problem Solving: Slow processing General Comments: decreased awarneness of how his deficits affect him functionally; will further  assess   Physical Exam: Blood pressure (!) 184/88, pulse (!) 54, temperature 98.8 F (37.1 C), temperature source Oral, resp. rate 15, height 5' 5"  (1.651 m), weight 84.8 kg, SpO2 100 %. Physical Exam Constitutional:      General: He is not in acute distress. HENT:     Head: Normocephalic.     Right Ear: External ear normal.     Left Ear: External ear normal.     Nose: Nose normal.     Mouth/Throat:     Pharynx: Oropharynx is clear.  Eyes:     Conjunctiva/sclera: Conjunctivae normal.  Cardiovascular:     Rate and Rhythm: Regular rhythm. Bradycardia present.     Pulses: Normal pulses.  Pulmonary:     Effort: Pulmonary effort is normal. No respiratory distress.     Breath sounds: Normal breath sounds. No wheezing.  Abdominal:     General: Bowel sounds are normal. There is no distension.     Palpations: Abdomen is soft. There is no mass.  Musculoskeletal:     Cervical back: Normal range of motion.     Right lower leg: No edema.     Left lower leg: No edema.  Skin:    General: Skin is warm.     Comments: RIJ in place  Neurological:     Mental Status: He is alert.     Comments: Patient is alert.  Makes eye contact with examiner.  Speech is a bit dysarthric but intelligible.  Follows commands. Fair insight and awareness.  Provides name and age. LUE 2/5 prox to trace distally. LLE 2/5 prox to distal. No focal sensory findings.   Psychiatric:     Comments: Pt a little flat but generally appropriate.        Lab Results Last 48 Hours        Results for orders placed or performed during the hospital encounter of 09/14/21 (from the past 48 hour(s))  Ethanol     Status: None    Collection Time: 09/14/21  1:22 PM  Result Value Ref Range    Alcohol, Ethyl (B) <10 <10 mg/dL      Comment: (NOTE) Lowest detectable limit for serum alcohol is 10 mg/dL.   For medical purposes only. Performed at [facility], [place] [address], [place], [place] [address]    Type and screen [place]     Status: None (Preliminary result)    Collection Time: 09/14/21  1:22 PM  Result Value Ref Range    ABO/RH(D) O POS      Antibody Screen NEG      Sample Expiration          09/17/2021,2359 Performed at [facility], [place] [address], [place], [place] [address]      Unit Number [id number]      Blood Component Type RED CELLS,LR      Unit division 00      Status of Unit ALLOCATED      Transfusion Status OK TO TRANSFUSE      Crossmatch Result Compatible      Unit Number [id number]      Blood Component Type RED CELLS,LR      Unit division 00      Status of Unit ALLOCATED      Transfusion Status OK TO TRANSFUSE  Crossmatch Result Compatible    Protime-INR     Status: None    Collection Time: 09/14/21  1:23 PM  Result Value Ref Range    Prothrombin Time 14.0 11.4 - 15.2 seconds    INR 1.1 0.8 - 1.2      Comment: (NOTE) INR goal varies based on device and disease states. Performed at [facility], [place] [address] [place], [place] [address]    APTT     Status: None    Collection Time: 09/14/21  1:23 PM  Result Value Ref Range    aPTT 33 24 - 36 seconds      Comment: Performed at [facility] [address], [place], [place] [address]  CBC     Status: Abnormal    Collection Time: 09/14/21  1:23 PM  Result Value Ref Range    WBC 3.0 (L) 4.0 - 10.5 K/uL    RBC 1.95 (L) 4.22 - 5.81 MIL/uL    Hemoglobin 6.8 (LL) 13.0 - 17.0 g/dL      Comment: REPEATED TO VERIFY THIS CRITICAL RESULT HAS VERIFIED AND BEEN CALLED TO EMILEE SHA RN BY MELISSA BOOSTEDT ON 08 12 2023 AT 1347, AND HAS BEEN READ BACK.       HCT 20.0 (L) 39.0 - 52.0 %    MCV 102.6 (H) 80.0 - 100.0 fL    MCH 34.9 (H) 26.0 - 34.0 pg    MCHC 34.0 30.0 - 36.0 g/dL    RDW 14.6 11.5 - 15.5 %    Platelets 137 (L) 150 - 400 K/uL    nRBC 0.0 0.0 - 0.2 %      Comment: Performed at [facility] [address] [place], [place] [address]  Differential     Status: Abnormal     Collection Time: 09/14/21  1:23 PM  Result Value Ref Range    Neutrophils Relative % 67 %    Neutro Abs 2.0 1.7 - 7.7 K/uL    Lymphocytes Relative 16 %    Lymphs Abs 0.5 (L) 0.7 - 4.0 K/uL    Monocytes Relative 11 %    Monocytes Absolute 0.3 0.1 - 1.0 K/uL    Eosinophils Relative 6 %    Eosinophils Absolute 0.2 0.0 - 0.5 K/uL    Basophils Relative 0 %    Basophils Absolute 0.0 0.0 - 0.1 K/uL    Immature Granulocytes 0 %    Abs Immature Granulocytes 0.01 0.00 - 0.07 K/uL      Comment: Performed at [facility] [address] [place], [place] [address]  Comprehensive metabolic panel     Status: Abnormal    Collection Time: 09/14/21  1:23 PM  Result Value Ref Range    Sodium 139 135 - 145 mmol/L    Potassium 3.6 3.5 - 5.1 mmol/L    Chloride 102 98 - 111 mmol/L    CO2 29 22 - 32 mmol/L    Glucose, Bld 95 70 - 99 mg/dL      Comment: Glucose reference range applies only to samples taken after fasting for at least 8 hours.    BUN 23 (H) 6 - 20 mg/dL    Creatinine, Ser 7.52 (H) 0.61 - 1.24 mg/dL    Calcium 10.1 8.9 - 10.3 mg/dL    Total Protein 6.1 (L) 6.5 - 8.1 g/dL    Albumin 3.3 (L) 3.5 - 5.0 g/dL    AST 15 15 - 41 U/L  ALT 12 0 - 44 U/L    Alkaline Phosphatase 52 38 - 126 U/L    Total Bilirubin 0.4 0.3 - 1.2 mg/dL    GFR, Estimated 8 (L) >60 mL/min      Comment: (NOTE) Calculated using the CKD-EPI Creatinine Equation (2021)      Anion gap 8 5 - 15      Comment: Performed at [facility] [address], [place], [place] [address]  I-Stat beta hCG blood, ED ([facility], [facility], [facility] only)     Status: None    Collection Time: 09/14/21  1:47 PM  Result Value Ref Range    I-stat hCG, quantitative <5.0 <5 mIU/mL    Comment 3               Comment:   GEST. AGE      CONC.  (mIU/mL)   <=1 WEEK        5 - 50     2 WEEKS       50 - 500     3 WEEKS       100 - 10,000     4 WEEKS     1,000 - 30,000        MALE AND NON-PREGNANT MALE:     LESS THAN 5 mIU/mL    HIV Antibody  (routine testing w rflx)     Status: None    Collection Time: 09/14/21  9:38 PM  Result Value Ref Range    HIV Screen 4th Generation wRfx Non Reactive Non Reactive      Comment: Performed at [facility], [place] [address] [place], [place] [address]  Hemoglobin A1c     Status: Abnormal    Collection Time: 09/14/21  9:38 PM  Result Value Ref Range    Hgb A1c MFr Bld 4.1 (L) 4.8 - 5.6 %      Comment: (NOTE) Pre diabetes:          5.7%-6.4%   Diabetes:              >6.4%   Glycemic control for   <7.0% adults with diabetes      Mean Plasma Glucose 70.97 mg/dL      Comment: Performed at [facility] [address], [place], [place] [address]  Magnesium     Status: None    Collection Time: 09/14/21  9:38 PM  Result Value Ref Range    Magnesium 2.1 1.7 - 2.4 mg/dL      Comment: Performed at [facility], [place] [address], [place], [place] [address]  TSH     Status: None    Collection Time: 09/14/21  9:38 PM  Result Value Ref Range    TSH 3.910 0.350 - 4.500 uIU/mL      Comment: Performed by a 3rd Generation assay with a functional sensitivity of <=0.01 uIU/mL. Performed at [facility], [place] [address], [place], [place] [address]    Lipid panel     Status: None    Collection Time: 09/14/21  9:38 PM  Result Value Ref Range    Cholesterol 126 0 - 200 mg/dL    Triglycerides 62 <150 mg/dL    HDL 59 >40 mg/dL    Total CHOL/HDL Ratio 2.1 RATIO    VLDL 12 0 - 40 mg/dL    LDL Cholesterol 55 0 - 99 mg/dL      Comment:  Total Cholesterol/HDL:CHD Risk Coronary Heart Disease Risk Table                     Men   Women  1/2 Average Risk   3.4   3.3  Average Risk       5.0   4.4  2 X Average Risk   9.6   7.1  3 X Average Risk  23.4   11.0        Use the calculated Patient Ratio above and the CHD Risk Table to determine the patient's CHD Risk.        ATP III CLASSIFICATION (LDL):  <100     mg/dL   Optimal  100-129  mg/dL   Near or Above                     Optimal  130-159  mg/dL   Borderline  160-189  mg/dL   High  >190     mg/dL   Very High Performed at [facility] [address], [place], [place] [address]    Comprehensive metabolic panel     Status: Abnormal    Collection Time: 09/15/21  3:29 AM  Result Value Ref Range    Sodium 138 135 - 145 mmol/L    Potassium 4.0 3.5 - 5.1 mmol/L    Chloride 101 98 - 111 mmol/L    CO2 26 22 - 32 mmol/L    Glucose, Bld 95 70 - 99 mg/dL      Comment: Glucose reference range applies only to samples taken after fasting for at least 8 hours.    BUN 25 (H) 6 - 20 mg/dL    Creatinine, Ser 8.86 (H) 0.61 - 1.24 mg/dL    Calcium 9.8 8.9 - 10.3 mg/dL    Total Protein 5.7 (L) 6.5 - 8.1 g/dL    Albumin 3.1 (L) 3.5 - 5.0 g/dL    AST 12 (L) 15 - 41 U/L    ALT 9 0 - 44 U/L    Alkaline Phosphatase 50 38 - 126 U/L    Total Bilirubin 0.4 0.3 - 1.2 mg/dL    GFR, Estimated 6 (L) >60 mL/min      Comment: (NOTE) Calculated using the CKD-EPI Creatinine Equation (2021)      Anion gap 11 5 - 15      Comment: Performed at [facility], [place] [address] [place], [place] [address]  CBC with Differential/Platelet     Status: Abnormal    Collection Time: 09/15/21  3:30 AM  Result Value Ref Range    WBC 2.7 (L) 4.0 - 10.5 K/uL    RBC 2.02 (L) 4.22 - 5.81 MIL/uL    Hemoglobin 6.8 (LL) 13.0 - 17.0 g/dL      Comment: CRITICAL VALUE NOTED.  VALUE IS CONSISTENT WITH PREVIOUSLY REPORTED AND CALLED VALUE. REPEATED TO VERIFY      HCT 21.3 (L) 39.0 - 52.0 %    MCV 105.4 (H) 80.0 - 100.0 fL    MCH 33.7 26.0 - 34.0 pg    MCHC 31.9 30.0 - 36.0 g/dL    RDW 14.8 11.5 - 15.5 %    Platelets 135 (L) 150 - 400 K/uL    nRBC 0.0 0.0 - 0.2 %    Neutrophils Relative % 67 %    Neutro Abs 1.8 1.7 - 7.7 K/uL    Lymphocytes Relative 16 %    Lymphs Abs 0.4 (  L) 0.7 - 4.0 K/uL    Monocytes Relative 12 %    Monocytes Absolute 0.3 0.1 - 1.0 K/uL    Eosinophils Relative 5 %    Eosinophils Absolute 0.1 0.0 - 0.5 K/uL     Basophils Relative 0 %    Basophils Absolute 0.0 0.0 - 0.1 K/uL    Immature Granulocytes 0 %    Abs Immature Granulocytes 0.01 0.00 - 0.07 K/uL      Comment: Performed at [facility] [address] [place], [place] [address]  Prepare RBC (crossmatch)     Status: None    Collection Time: 09/16/21  2:23 PM  Result Value Ref Range    Order Confirmation          ORDER PROCESSED BY BLOOD BANK Performed at [facility], [place] [address] [place] [address], [place], [place] [address]         Imaging Results (Last 48 hours)  ECHOCARDIOGRAM COMPLETE   Result Date: 09/15/2021    ECHOCARDIOGRAM REPORT   Patient Name:   James Wall [facility] Date of Exam: 09/15/2021 Medical Rec #:  [id number]           Height:       65.0 in Accession #:    [id number]          Weight:       187.0 lb Date of Birth:  1965-10-09           BSA:          1.922 m Patient Age:    56 years            BP:           170/100 mmHg Patient Gender: M                   HR:           56 bpm. Exam Location:  Inpatient Procedure: 2D Echo Indications:    stroke  History:        Patient has prior history of Echocardiogram examinations, most                 recent 09/13/2020. COPD and end stage renal disease,                 Arrythmias:first degree heart block; Risk Factors:Hypertension                 and Sleep Apnea.  Sonographer:    Johny Chess RDCS Referring Phys: [place]  1. Left ventricular ejection fraction, by estimation, is 55 to 60%. The left ventricle has normal function. The left ventricle has no regional wall motion abnormalities. There is moderate concentric left ventricular hypertrophy. Left ventricular diastolic parameters are consistent with Grade III diastolic dysfunction (restrictive). Elevated left atrial pressure.  2. Right ventricular systolic function is normal. The right ventricular size is normal. There is severely elevated pulmonary artery systolic pressure. The estimated right ventricular systolic  pressure is 50.0 mmHg.  3. Left atrial size was severely dilated.  4. The mitral valve is normal in structure. Mild to moderate mitral valve regurgitation. No evidence of mitral stenosis.  5. Tricuspid valve regurgitation is mild to moderate.  6. The aortic valve is tricuspid. Aortic valve regurgitation is not visualized. No aortic stenosis is present.  7. The inferior vena cava is dilated in size with <50% respiratory variability, suggesting right atrial pressure of 15 mmHg. Comparison(s): RVSP is worse than  prior; stable LV function. FINDINGS  Left Ventricle: Left ventricular ejection fraction, by estimation, is 55 to 60%. The left ventricle has normal function. The left ventricle has no regional wall motion abnormalities. The left ventricular internal cavity size was normal in size. There is  moderate concentric left ventricular hypertrophy. Left ventricular diastolic parameters are consistent with Grade III diastolic dysfunction (restrictive). Elevated left atrial pressure. Right Ventricle: The right ventricular size is normal. No increase in right ventricular wall thickness. Right ventricular systolic function is normal. There is severely elevated pulmonary artery systolic pressure. The tricuspid regurgitant velocity is 4.36 m/s, and with an assumed right atrial pressure of 15 mmHg, the estimated right ventricular systolic pressure is 15.0 mmHg. Left Atrium: Left atrial size was severely dilated. Right Atrium: Right atrial size was normal in size. Pericardium: Trivial pericardial effusion is present. Mitral Valve: The mitral valve is normal in structure. Mild mitral annular calcification. Mild to moderate mitral valve regurgitation. No evidence of mitral valve stenosis. Tricuspid Valve: The tricuspid valve is normal in structure. Tricuspid valve regurgitation is mild to moderate. No evidence of tricuspid stenosis. Aortic Valve: The aortic valve is tricuspid. Aortic valve regurgitation is not visualized. No  aortic stenosis is present. Pulmonic Valve: The pulmonic valve was normal in structure. Pulmonic valve regurgitation is not visualized. No evidence of pulmonic stenosis. Aorta: The aortic root and ascending aorta are structurally normal, with no evidence of dilitation. Venous: The inferior vena cava is dilated in size with less than 50% respiratory variability, suggesting right atrial pressure of 15 mmHg. IAS/Shunts: No atrial level shunt detected by color flow Doppler.  LEFT VENTRICLE PLAX 2D LVIDd:         5.30 cm Diastology LVIDs:         3.70 cm LV e' medial:    5.33 cm/s LV PW:         1.40 cm LV E/e' medial:  23.8 LV IVS:        1.20 cm LV e' lateral:   4.35 cm/s                        LV E/e' lateral: 29.2  RIGHT VENTRICLE             IVC RV S prime:     11.90 cm/s  IVC diam: 2.40 cm TAPSE (M-mode): 1.8 cm LEFT ATRIUM              Index        RIGHT ATRIUM           Index LA diam:        4.80 cm  2.50 cm/m   RA Area:     16.20 cm LA Vol (A2C):   100.0 ml 52.02 ml/m  RA Volume:   41.90 ml  21.80 ml/m LA Vol (A4C):   113.0 ml 58.78 ml/m LA Biplane Vol: 106.0 ml 55.14 ml/m  AORTIC VALVE LVOT Vmax:   94.00 cm/s LVOT Vmean:  59.500 cm/s LVOT VTI:    0.209 m  AORTA Ao Asc diam: 3.20 cm MITRAL VALVE                TRICUSPID VALVE MV Area (PHT): 3.42 cm     TR Peak grad:   76.0 mmHg MV Decel Time: 222 msec     TR Vmax:        436.00 cm/s MR Peak grad: 108.6 mmHg MR Mean grad: 77.0 mmHg  SHUNTS MR Vmax:      521.00 cm/s   Systemic VTI: 0.21 m MR Vmean:     416.0 cm/s MV E velocity: 127.00 cm/s MV A velocity: 59.50 cm/s MV E/A ratio:  2.13 Rudean Haskell MD Electronically signed by Rudean Haskell MD Signature Date/Time: 09/15/2021/2:42:29 PM    Final     CT ANGIO HEAD NECK W WO CM   Result Date: 09/14/2021 CLINICAL DATA:  Follow-up examination for stroke. EXAM: CT ANGIOGRAPHY HEAD AND NECK TECHNIQUE: Multidetector CT imaging of the head and neck was performed using the standard protocol during  bolus administration of intravenous contrast. Multiplanar CT image reconstructions and MIPs were obtained to evaluate the vascular anatomy. Carotid stenosis measurements (when applicable) are obtained utilizing NASCET criteria, using the distal internal carotid diameter as the denominator. RADIATION DOSE REDUCTION: This exam was performed according to the departmental dose-optimization program which includes automated exposure control, adjustment of the mA and/or kV according to patient size and/or use of iterative reconstruction technique. CONTRAST:  164m OMNIPAQUE IOHEXOL 350 MG/ML SOLN COMPARISON:  MRI from earlier the same day. FINDINGS: CT HEAD FINDINGS Brain: Atrophy with moderate chronic microvascular ischemic disease. Few identified right frontal infarct better seen on prior MRI. No other acute large vessel territory infarct. No intracranial hemorrhage. No mass lesion, midline shift or mass effect. No hydrocephalus or extra-axial fluid collection. Vascular: No hyperdense vessel. Calcified atherosclerosis present at skull base. Skull: Scalp soft tissues and calvarium within normal limits. Sinuses/Orbits: Globes orbital soft tissues within normal limits. Paranasal sinuses and mastoid air cells are largely clear. Other: None. Review of the MIP images confirms the above findings CTA NECK FINDINGS Aortic arch: Visualized aortic arch normal caliber with standard branch pattern. Moderate atheromatous change about the arch itself. No high-grade stenosis about the origin the great vessels. Right carotid system: Bulky calcified plaque about the right carotid bulb/proximal right ICA with associated stenosis of up to 40% by NASCET criteria. No dissection or vascular occlusion. Left carotid system: Or few calcified plaque about the left carotid bulb/proximal left ICA without hemodynamically significant stenosis. No dissection or vascular occlusion. Vertebral arteries: Left vertebral artery arises directly from the  aortic arch. Right vertebral artery dominant. Extensive plaque about the proximal right vertebral artery with associated up to severe stenosis at the pre foraminal right V1 segment (a series 13, image 252). Vertebral arteries otherwise patent without stenosis or dissection. Skeleton: No visible discrete or worrisome osseous lesions. Moderate spondylosis present at C5-6. Other neck: Multiple prominent lymph nodes seen within the lower left neck, measuring up to 1.1 cm at the left supraclavicular fossa. No other acute abnormality. Upper chest: Extensive mediastinal adenopathy is seen, largest of which measures up to 1.3 cm at prevascular space of the indeterminate. Layering bilateral pleural effusions with evidence of pulmonary interstitial edema within the visualized lungs, suggesting CHF. Right-sided central venous catheter in place. Review of the MIP images confirms the above findings CTA HEAD FINDINGS Anterior circulation: Petrous segments patent bilaterally. Atheromatous change within the carotid siphons with up to moderate stenoses about the para clinoid segments bilaterally. A1 segments patent. Normal anterior communicating artery complex. Anterior cerebral arteries patent without stenosis. No M1 stenosis or occlusion. No proximal MCA branch occlusion. Distal MCA branches perfused and symmetric right Posterior circulation: Dominant right V4 segment widely patent. Right PICA grossly patent at its origin. Hypoplastic left vertebral artery terminates in PICA. Left PICA patent as well. Basilar patent to its distal aspect without stenosis. Superior cerebellar and posterior  cerebral arteries patent bilaterally. Venous sinuses: Patent allowing for timing the contrast bolus. Anatomic variants: Hypoplastic left vertebral artery terminates in PICA. No intracranial aneurysm. Review of the MIP images confirms the above findings IMPRESSION: CT HEAD IMPRESSION: 1. No acute intracranial abnormality by CT. Previously  identified right MCA distribution infarct better seen on prior MRI. No intracranial hemorrhage. 2. Atrophy with moderate chronic microvascular ischemic disease. CTA HEAD AND NECK IMPRESSION: 1. Negative CTA for large vessel occlusion or other emergent finding. 2. Bulky calcified plaque about the right carotid bulb/proximal right ICA with associated stenosis of up to 40% by NASCET criteria. 3. Atheromatous change about the carotid siphons with up to moderate stenoses about the para clinoid segments bilaterally. 4. Extensive plaque about the proximal right vertebral artery with associated severe stenosis at the pre foraminal right V1 segment. Right vertebral artery dominant. Left vertebral artery hypoplastic and terminates in PICA, with no significant contribution to the posterior circulation. 5. Layering bilateral pleural effusions with pulmonary interstitial edema within the visualized lungs, suggesting CHF. 6. Prominent adenopathy within the lower left neck and visualized mediastinum. Findings are nonspecific, and could be reactive in nature. Possible nodal metastatic disease or lymphoproliferative disorder could also have this appearance. Electronically Signed   By: Jeannine Boga M.D.   On: 09/14/2021 23:48    MR BRAIN WO CONTRAST   Addendum Date: 09/14/2021   ADDENDUM REPORT: 09/14/2021 19:16 ADDENDUM: The acute infarct is in the right frontal lobe, not the left frontal lobe. Electronically Signed   By: San Morelle M.D.   On: 09/14/2021 19:16    Result Date: 09/14/2021 CLINICAL DATA:  Neuro deficit, acute, stroke suspected. Increased weakness, asymmetric on the left since Wednesday. EXAM: MRI HEAD WITHOUT CONTRAST TECHNIQUE: Multiplanar, multiecho pulse sequences of the brain and surrounding structures were obtained without intravenous contrast. COMPARISON:  Iron infusion yesterday. FINDINGS: Brain: Acute nonhemorrhagic infarct is demonstrated in the left precentral gyrus extending into the  corona radiata. There is some involvement of more anterior frontal lobe as well. T2 and FLAIR changes are associated. Moderate atrophy and scattered periventricular subcortical T2 hyperintensities are present bilaterally separate from the area of acute/subacute infarct. Dilated perivascular spaces are present throughout the basal ganglia. The ventricles are of normal size. Remote lacunar infarct is present in the left thalamus. The internal auditory canals are within normal limits. The brainstem and cerebellum are within normal limits. Vascular: Flow is present in the major intracranial arteries. Skull and upper cervical spine: The craniocervical junction is normal. Upper cervical spine is within normal limits. Marrow signal is unremarkable. Sinuses/Orbits: The paranasal sinuses and mastoid air cells are clear. The globes and orbits are within normal limits. IMPRESSION: 1. Acute nonhemorrhagic infarct involving the left precentral gyrus extending into the corona radiata and more anterior frontal lobe. 2. Moderate atrophy and white matter disease likely reflects the sequela of chronic microvascular ischemia. 3. Remote lacunar infarct of the left thalamus. These results were called by telephone at the time of interpretation on 09/14/2021 at 6:27 pm to provider [facility] , who verbally acknowledged these results. Electronically Signed: By: San Morelle M.D. On: 09/14/2021 18:30          Blood pressure (!) 184/88, pulse (!) 54, temperature 98.8 F (37.1 C), temperature source Oral, resp. rate 15, height 5' 5"  (1.651 m), weight 84.8 kg, SpO2 100 %.   Medical Problem List and Plan: 1. 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  Recommendations of  30-day monitor             -patient may shower             -ELOS/Goals: 14 days, supervision goals 2.  Antithrombotics: -DVT/anticoagulation:  Mechanical: Antiembolism stockings, thigh (TED hose) Bilateral  lower extremities             -antiplatelet therapy: Plavix 75 mg daily only.  DAPT held due to chronic anemia 3. Pain Management: N/A 4. Mood/Behavior/Sleep: Provide emotional support             -antipsychotic agents: N/A 5. Neuropsych/cognition: This patient is capable of making decisions on his own behalf. 6. Skin/Wound Care: Routine skin checks 7. Fluids/Electrolytes/Nutrition: Routine in and outs with follow-up chemistries 8.  Hypertension.  Norvasc 10 mg daily, clonidine patch 0.3 mg weekly, hydralazine 25 mg twice daily, Isordil 20 mg daily, Lopressor 100 mg twice daily.   -Monitor with increased physical activity. 9.  End-stage renal disease.  Hemodialysis as directed per renal services             -HD at end of day to allow participation in therapies 10.  Hyperlipidemia.  Crestor 11.  Nonalcoholic cirrhosis with ascites.  Paracentesis 2014-2015. 12.  History of prostate cancer.  Followed outpatient by Dr. Gloriann Loan and recently completed radiation therapy.  Continue Flomax 13.  History of first-degree AV block.  No chest pain or shortness of breath 14.  Tobacco abuse.  Counseling 15.  COPD.  OSA.  Patient no longer on CPAP. 16.  Chronic anemia.  Plan IV iron weekly with ferric per hemodialysis     Cathlyn Parsons, PA-C 09/16/2021  I have personally performed a face to face diagnostic evaluation of this patient and formulated the key components of the plan.  Additionally, I have personally reviewed laboratory data, imaging studies, as well as relevant notes and concur with the physician assistant's documentation above.  The patient's status has not changed from the original H&P.  Any changes in documentation from the acute care chart have been noted above.  Meredith Staggers, MD, [facility]

## 2021-09-16 NOTE — PMR Pre-admission (Signed)
PMR Admission Coordinator Pre-Admission Assessment  Patient: James Wall is an 56 y.o., male MRN: [id number] DOB: October 27, 1965 Height: 5' 5" (165.1 cm) Weight: 84.8 kg  Insurance Information HMO:     PPO:      PCP:      IPA:      80/20:      OTHER:  PRIMARY: Medicare A & B      Policy#: [id number]      Subscriber: Patient CM Name:       Phone#:      Fax#:  Pre-Cert#:  Verified through passport onesource     Employer:  Benefits:  Phone #:      Name:  Eff. Date: 05/05/2007     Deduct: $1600      Out of Pocket Max:       Life Max:  CIR:  100%     SNF: 20 full days Outpatient:  80%    Co-Pay: 20% Home Health:  100%     Co-Pay:  DME:  80%    Co-Pay: 20% Providers:  SECONDARY: BCBS      Policy#: [id number]     Phone#:   Financial Counselor:       Phone#:   The Engineer, petroleum" for patients in Inpatient Rehabilitation Facilities with attached "Privacy Act [place] Records" was provided and verbally reviewed with: Patient and Family  Emergency Contact Information Contact Information     Name Relation Home Work [place] [phone]  [phone]   Chryl Heck [phone]  [phone]       Current Medical History  Patient Admitting Diagnosis: CVA History of Present Illness: James Wall is a 56 year old right-handed male with history of chronic anemia, nonalcoholic cirrhosis as well as ascites with paracentesis 2014-2015, COPD/OSA no longer on CPAP, end-stage renal disease with hemodialysis since 2009, hypertension, prostate cancer followed by Dr. Gloriann Loan, first-degree AV block, hypertension, hyperlipidemia, tobacco use.  Per chart review patient lives alone.  1 level home one-step to entry.  He takes SCAT to hemodialysis.  He has a sister and friends to take him to the grocery store when needed.  Presented 09/14/2021 with with left arm weakness as well as dysarthric speech 09/11/2021 and patient did not initially  seek medical attention.  He was having a hard time holding his utensils.  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  Remote lacunar infarct of the left thalamus.  CT angiogram head and neck negative for large vessel occlusion.  Bulky calcified plaque about the right carotid bulb/proximal right ICA with associated stenosis of up to 40%.  Admission chemistries unremarkable except BUN 23, creatinine 7.52, hemoglobin 6.8, alcohol negative.  Echocardiogram with ejection fraction of 55 to 60% no wall motion abnormality grade 3 diastolic dysfunction.  Patient did not receive tPA.  Neurology follow-up currently maintained on Plavix for CVA prophylaxis.  DAPT held due to chronic anemia as well as reported intolerance to aspirin.  Renal service follow-up with hemodialysis ongoing as directed.  Tolerating a regular consistency diet.  Therapy evaluations completed due to patient's left-sided weakness dysarthric speech was recommended for a comprehensive rehab program.  Complete NIHSS TOTAL: 8  Patient's medical record from [facility] has been reviewed by the rehabilitation admission coordinator and physician.  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;  dialysis at [facility] on MWF   First degree heart block    GERD (gastroesophageal reflux disease)    Hiatal hernia    History of acute  respiratory failure 07/2013   admission;  HCAP w/ ARF with hypoxia   History of adenomatous polyp of colon    History of ascites    s/p paracentesis 01-31-2013 (5L) and last one 03-28-2013 (2.7L)   History of community acquired pneumonia 08/08/2020   admission ; LLL , POA   History of heart murmur in childhood    History of MRSA infection 12/19/2012   hospital admission;  w/ sepsis MRSA bacteramia AVF infection   History of sepsis 03/2017   admission;   HCAP w/ sepsis   Hyperlipidemia    Hypertension    Hypertensive heart disease    cardiologist--- dr Osborne Oman;  nuclear stress test 06-16-2013 intermediate risk w/ mid-distal anterior wall ischemia , not gated;  cardiac cath 07-13-2013 in epic showed normal coronaries and LVSF,  ef not assessed, LCEDP 49mHg   Hypertensive nephrosclerosis, 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 ([place]    Intolerance to cold    due to anemia   Malignant neoplasm prostate ([facility]    urologist--- dr bell/  radiation onologist--- dr mTammi Klippel  dx 01/ 2023,  Gleason 4+3, PSA 385  [place]mic cardiomyopathy) [place]    followed by cardiology;   last echo in epic 09-13-2020 ef 55-60%   OSA (obstructive sleep apnea) 2009   no  longer using cpap since the yr started 2009;   sleep study in epic 05-11-2007 severe osa   PSVT (paroxysmal supraventricular tachycardia) ([place]    event monitor 08-01-2019  SR w/ SVT runs , rare PAC/ PVC   Secondary hyperparathyroidism [place]    Wears glasses     Has the patient had major surgery during 100 days prior to admission? No  Family History   family history includes Asthma in his brother; Cerebrovascular Accident in his father; Congestive Heart Failure in his brother; Hypertension in his father and mother.  Current Medications  Current Facility-Administered Medications:    amLODipine (NORVASC) tablet 10 mg, 10 mg, Oral, QHS, Dahbura, Anton, DO, 10 mg at 09/15/21 2146   Chlorhexidine Gluconate  Cloth 2 % PADS 6 each, 6 each, Topical, Q0600, SRoney Jaffe MD, 6 each at 09/16/21 0604   cloNIDine (CATAPRES - Dosed in mg/24 hr) patch 0.3 mg, 0.3 mg, Transdermal, Q Sun, Dahbura, Anton, DO, 0.3 mg at 09/15/21 05621  clopidogrel (PLAVIX) tablet 75 mg, 75 mg, Oral, Daily, WBeulah GandyA, NP, 75 mg at 09/15/21 1156   famotidine (PEPCID) tablet 10 mg, 10 mg, Oral, Daily PRN, BLeslie Dales MD, 10 mg at 09/15/21 2146   ferric gluconate (FERRLECIT) 250 mg in sodium chloride 0.9 % 250 mL IVPB, 250 mg, Intravenous, Q M,W,F-HD, SGean Quint MD   hydrALAZINE (APRESOLINE) tablet 25 mg, 25 mg, Oral, BID, DWells Guiles DO, 25 mg at 09/15/21 2146  isosorbide dinitrate (ISORDIL) tablet 20 mg, 20 mg, Oral, BID, Maxwell, Allee, MD   metoprolol tartrate (LOPRESSOR) tablet 100 mg, 100 mg, Oral, BID, Wells Guiles, DO, 100 mg at 09/15/21 2146   rosuvastatin (CRESTOR) tablet 10 mg, 10 mg, Oral, Daily, Espinoza, Alejandra, DO, 10 mg at 09/15/21 1156   sevelamer carbonate (RENVELA) tablet 3,200 mg, 3,200 mg, Oral, TID WC, Roney Jaffe, MD, 3,200 mg at 09/15/21 1731   sodium chloride flush (NS) 0.9 % injection 3 mL, 3 mL, Intravenous, Once, Dahbura, Anton, DO   tamsulosin (FLOMAX) capsule 0.4 mg, 0.4 mg, Oral, QPC supper, Dahbura, Anton, DO, 0.4 mg at 09/15/21 1731   umeclidinium-vilanterol (ANORO ELLIPTA) 62.5-25 MCG/ACT 1 puff, 1 puff, Inhalation, Daily, Maxwell, Allee, MD  Patients Current Diet:  Diet Order             Diet - low sodium heart healthy           Diet Carb Modified           Diet renal/carb modified with fluid restriction Diet-HS Snack? Nothing; Fluid restriction: 1200 mL Fluid; Room service appropriate? Yes; Fluid consistency: Thin  Diet effective now                   Precautions / Restrictions Precautions Precautions: Fall Restrictions Weight Bearing Restrictions: No   Has the patient had 2 or more falls or a fall with injury in the past year? No  Prior Activity  Level Limited Community (1-2x/wk): used cane occasionally, does not drive Community (4-2H/CW): goes to HD 3x per week via transport  Prior Functional Level Self Care: Did the patient need help bathing, dressing, using the toilet or eating? Independent  Indoor Mobility: Did the patient need assistance with walking from room to room (with or without device)? Independent  Stairs: Did the patient need assistance with internal or external stairs (with or without device)? Independent  Functional Cognition: Did the patient need help planning regular tasks such as shopping or remembering to take medications? Independent  Patient Information Are you of Hispanic, Latino/a,or Spanish origin?: A. No, not of Hispanic, Latino/a, or Spanish origin What is your race?: B. Black or African American Do you need or want an interpreter to communicate with a doctor or health care staff?: 0. No  Patient's Response To:  Health Literacy and Transportation Is the patient able to respond to health literacy and transportation needs?: Yes Health Literacy - How often do you need to have someone help you when you read instructions, pamphlets, or other written material from your doctor or pharmacy?: Never In the past 12 months, has lack of transportation kept you from medical appointments or from getting medications?: No In the past 12 months, has lack of transportation kept you from meetings, work, or from getting things needed for daily living?: No  Development worker, international aid / [place] Devices/Equipment: [place]: Kasandra Knudsen - single point  Prior Device Use: Indicate devices/aids used by the patient prior to current illness, exacerbation or injury? None of the above  Current Functional Level Cognition  Arousal/Alertness: Awake/alert Overall Cognitive Status: Impaired/Different from baseline Current Attention Level: Selective Orientation Level: Oriented X4 Safety/Judgement: Decreased  awareness of safety, Decreased awareness of deficits General Comments: tangential speech, hyperverbose Memory: Appears intact Awareness: Appears intact Problem Solving: Appears intact Safety/Judgment: Appears intact    Extremity Assessment (includes Sensation/Coordination)  Upper Extremity Assessment: Generalized weakness LUE Deficits / Details: minimal movement into flxor pattern at shoulder and elbow; no  movement noted wrist/hand; non-functional LUE Sensation: decreased light touch, decreased proprioception LUE Coordination: decreased fine motor, decreased gross motor  Lower Extremity Assessment: LLE deficits/detail LLE Deficits / Details: Hip flexion 2/5, knee extension 3+/5, knee flexion 3/5; ankle dorsiflexion 3/5; Difficulty with hip flexion against gravity, better in gravity eliminated LLE Coordination: decreased gross motor    ADLs  Overall ADL's : Needs assistance/impaired Grooming: Minimal assistance Upper Body Bathing: Minimal assistance Upper Body Bathing Details (indicate cue type and reason): simulated seated Lower Body Bathing: Moderate assistance, Sitting/lateral leans Lower Body Bathing Details (indicate cue type and reason): simulated Upper Body Dressing : Moderate assistance Lower Body Dressing: Maximal assistance, Bed level Functional mobility during ADLs: Moderate assistance, Minimal assistance, Cueing for safety, Rolling walker (2 wheels)    Mobility  Overal bed mobility: Needs Assistance Bed Mobility: Supine to Sit Supine to sit: Mod assist General bed mobility comments: pt up in chair upon OT arrival    Transfers  Overall transfer level: Needs assistance Equipment used: Rolling walker (2 wheels) Transfers: Sit to/from Stand, Bed to chair/wheelchair/BSC Sit to Stand: Mod assist, Min assist Bed to/from chair/wheelchair/BSC transfer type:: Step pivot Step pivot transfers: Min assist General transfer comment: pt strongly desires to do things independently  but ultimately requiring minA to power up and stand, modA for weight shifting and advancement of  L LE during step pvt to EOB    Ambulation / Gait / Stairs / Wheelchair Mobility  Ambulation/Gait Ambulation/Gait assistance: Mod assist Gait Distance (Feet): 15 Feet Assistive device: Rolling walker (2 wheels) Gait Pattern/deviations: Decreased stance time - left, Decreased dorsiflexion - left, Knee flexed in stance - left General Gait Details: requiring mod A for ambulation Gait velocity: decreased Gait velocity interpretation: <1.8 ft/sec, indicate of risk for recurrent falls    Posture / Balance Balance Overall balance assessment: Needs assistance Sitting-balance support: Feet supported, No upper extremity supported Sitting balance-Leahy Scale: Fair Standing balance support: During functional activity, Bilateral upper extremity supported Standing balance-Leahy Scale: Poor Standing balance comment: requires external assist    Special needs/care consideration HD M,W,F   Previous Home Environment (from acute therapy documentation) Living Arrangements: Alone  Lives With: Alone Available Help at Discharge: Family, Friend(s), Neighbor Type of Home: House Home Layout: One level Home Access: Stairs to enter Entrance Stairs-Rails: Right Entrance Stairs-Number of Steps: 1 Bathroom Shower/Tub: Chiropodist: Standard Bathroom Accessibility: Yes How Accessible: Accessible via walker  Discharge Living Setting Plans for Discharge Living Setting: Patient's home Type of Home at Discharge: House Discharge Home Layout: One level Discharge Home Access: Stairs to enter Entrance Stairs-Rails: Right Entrance Stairs-Number of Steps: 1 Discharge Bathroom Shower/Tub: Tub/shower unit Discharge Bathroom Toilet: Standard Does the patient have any problems obtaining your medications?: No  Social/Family/Support Systems Anticipated Caregiver: Sister, Theresa/friend Gigi  johnson Anticipated Ambulance person Information: [phone] Ability/Limitations of Caregiver: can provide supervision to min A Caregiver Availability: Intermittent Discharge Plan Discussed with Primary Caregiver: Yes Is Caregiver In Agreement with Plan?: Yes Does Caregiver/Family have Issues with Lodging/Transportation while Pt is in Rehab?: No  Goals Patient/Family Goal for Rehab: Supervision/mod I for PT, OT, SLP Expected length of stay: 14 days Additional Information: patient has HD on M, W, F Pt/Family Agrees to Admission and willing to participate: Yes Program Orientation Provided & Reviewed with Pt/Caregiver Including Roles  & Responsibilities: Yes  Barriers to Discharge: Insurance for SNF coverage  Decrease burden of Care through IP rehab admission: OtherN/A  Possible need for SNF placement upon discharge: Not  anticipated  Patient Condition: I have reviewed medical records from [facility] ([facility]), spoken with  [facility] , and patient and family member. I met with patient at the bedside for inpatient rehabilitation assessment.  Patient will benefit from ongoing PT, OT, and SLP, can actively participate in 3 hours of therapy a day 5 days of the week, and can make measurable gains during the admission.  Patient will also benefit from the coordinated team approach during an Inpatient Acute Rehabilitation admission.  The patient will receive intensive therapy as well as Rehabilitation physician, nursing, social worker, and care management interventions.  Due to safety, skin/wound care, disease management, medication administration, and patient education the patient requires 24 hour a day rehabilitation nursing.  The patient is currently mod A with mobility and basic ADLs.  Discharge setting and therapy post discharge at home with home health is anticipated.  Patient has agreed to participate in the Acute Inpatient Rehabilitation Program and will admit today.  Preadmission Screen  Completed By:  Nelly Laurence, 09/16/2021 1:07 PM ______________________________________________________________________   Discussed status with Dr. Naaman Plummer on 09/16/21 at 13:09 and received approval for admission today.  Admission Coordinator:  Nelly Laurence, time 13:09/Date 09/16/21   Assessment/Plan: Diagnosis: right frontal infarct Does the need for close, 24 hr/day Medical supervision in concert with the patient's rehab needs make it unreasonable for this patient to be served in a less intensive setting? Yes Co-Morbidities requiring supervision/potential complications: HTN, COPD, NA Cirrhosis, esrd hemo, prostate cancer Due to bladder management, bowel management, safety, skin/wound care, disease management, medication administration, pain management, and patient education, does the patient require 24 hr/day rehab nursing? Yes Does the patient require coordinated care of a physician, rehab nurse, PT, OT, and SLP to address physical and functional deficits in the context of the above medical diagnosis(es)? Yes Addressing deficits in the following areas: balance, endurance, locomotion, strength, transferring, bowel/bladder control, bathing, dressing, feeding, grooming, toileting, and psychosocial support Can the patient actively participate in an intensive therapy program of at least 3 hrs of therapy 5 days a week? Yes The potential for patient to make measurable gains while on inpatient rehab is excellent Anticipated functional outcomes upon discharge from inpatient rehab: supervision PT, supervision OT, supervision SLP Estimated rehab length of stay to reach the above functional goals is: 14 days Anticipated discharge destination: Home 10. Overall Rehab/Functional Prognosis: excellent   MD Signature: Meredith Staggers, MD, [place] Director Rehabilitation Services 09/16/2021

## 2021-09-16 NOTE — Progress Notes (Signed)
Physical Therapy Treatment Patient Details Name: James Wall MRN: [id number] DOB: 06-26-1965 Today's Date: 09/16/2021   History of Present Illness 56 year old presenting with L sided weakness. MRI imaging of the brain reveals a right precentral gyrus/frontal lobe infarct. PMH: ESRD on hemodialysis Monday Wednesday Friday, hypertension, hyperlipidemia, prostate CA, anxiety, cirrhosis, COPD, First degree heart block, PNU.    PT Comments    Pt very frustrated with lack of communication regarding his care. Pt initially stated he didn't want to do therapy on HD days due to fatigue however PT explained how important therapy is at this time to progress mobility to be able to return home indep as pt desires. PT also explained that once pt goes to a rehab venue he will have to do therapy on dialysis days. Pt then agreed to therapy. Pt with dense L hemiparesis in UE, less in LE. Trialed amb with RW however pt unable to grip walker with L hand, required modA for walker management, and max directional verbal cues to sequence L LE during stepping. Pt motivated to return to independence. Recommend AIR Upon d/c as pt to strongly benefit from aggressive therapy program for maximal functional recovery. Acute PT to cont to follow.    Recommendations for follow up therapy are one component of a multi-disciplinary discharge planning process, led by the attending physician.  Recommendations may be updated based on patient status, additional functional criteria and insurance authorization.  Follow Up Recommendations  Acute inpatient rehab (3hours/day)     Assistance Recommended at Discharge Frequent or constant Supervision/Assistance  Patient can return home with the following A lot of help with walking and/or transfers;A lot of help with bathing/dressing/bathroom;Assistance with cooking/housework;Assist for transportation;Help with stairs or ramp for entrance   Equipment Recommendations  Rolling walker (2  wheels);BSC/3in1 (RW wit hand splint)    Recommendations for Other Services Rehab consult     Precautions / Restrictions Precautions Precautions: Fall Restrictions Weight Bearing Restrictions: No     Mobility  Bed Mobility               General bed mobility comments: pt up in chair upon PT arrival    Transfers Overall transfer level: Needs assistance Equipment used: Rolling walker (2 wheels) Transfers: Sit to/from Stand, Bed to chair/wheelchair/BSC Sit to Stand: Mod assist, Min assist   Step pivot transfers: Min assist       General transfer comment: pt strongly desires to do things independently but ultimately requiring minA to power up and stand, modA for weight shifting and advancement of  L LE during step pvt to EOB    Ambulation/Gait Ambulation/Gait assistance: Mod assist Gait Distance (Feet): 15 Feet (x2) Assistive device: Rolling walker (2 wheels) Gait Pattern/deviations: Decreased stance time - left, Decreased dorsiflexion - left, Knee flexed in stance - left Gait velocity: dec Gait velocity interpretation: <1.8 ft/sec, indicate of risk for recurrent falls   General Gait Details: PT providing maxA to maintain L hand on RW, max directional verbal cues to sequence L LE stepping, verbal cues to increased step height, modA for walker management, increased time, mild DOE, 2 standing rest breaks, SPO2 >91% on RA   Stairs             Wheelchair Mobility    Modified Rankin (Stroke Patients Only) Modified Rankin (Stroke Patients Only) Pre-Morbid Rankin Score: Moderate disability Modified Rankin: Moderately severe disability     Balance Overall balance assessment: Needs assistance Sitting-balance support: Feet supported, No upper extremity supported Sitting balance-Leahy  Scale: Fair     Standing balance support: During functional activity Standing balance-Leahy Scale: Poor Standing balance comment: requires external assist                             Cognition Arousal/Alertness: Awake/alert Behavior During Therapy: WFL for tasks assessed/performed (irritated about lack of communication and being of HD schedule) Overall Cognitive Status: Impaired/Different from baseline Area of Impairment: Attention, Safety/judgement, Awareness, Problem solving                   Current Attention Level: Selective     Safety/Judgement: Decreased awareness of safety, Decreased awareness of deficits (tangential) Awareness: Emergent Problem Solving: Slow processing General Comments: decreased awarneness of how his deficits affect him functionally; tangential, talked about doing therapy on HD days and how that would work in [place], delayed processing, impaired sequencing and following multistep commands        Exercises General Exercises - Lower Extremity Ankle Circles/Pumps: AROM, Left, 10 reps Long Arc Quad: AROM, Left, 10 reps Hip Flexion/Marching: AROM, Left, 10 reps, Seated    General Comments General comments (skin integrity, edema, etc.): L UE swollen      Pertinent Vitals/Pain Pain Assessment Pain Assessment: No/denies pain Faces Pain Scale: No hurt Pain Descriptors / Indicators: Grimacing, Discomfort Pain Intervention(s): Monitored during session    Home Living                          Prior Function            PT Goals (current goals can now be found in the care plan section) Acute Rehab PT Goals Patient Stated Goal: Wants to be able to be independent PT Goal Formulation: With patient Time For Goal Achievement: 09/29/21 Potential to Achieve Goals: Good Progress towards PT goals: Progressing toward goals    Frequency    Min 4X/week      PT Plan Current plan remains appropriate    Co-evaluation              AM-PAC PT "6 Clicks" Mobility   Outcome Measure  Help needed turning from your back to your side while in a flat bed without using bedrails?: A Little Help needed moving  from lying on your back to sitting on the side of a flat bed without using bedrails?: A Little Help needed moving to and from a bed to a chair (including a wheelchair)?: A Little Help needed standing up from a chair using your arms (e.g., wheelchair or bedside chair)?: A Little Help needed to walk in hospital room?: A Lot Help needed climbing 3-5 steps with a railing? : Total 6 Click Score: 15    End of Session Equipment Utilized During Treatment: Gait belt Activity Tolerance: Patient tolerated treatment well Patient left: in chair;with call bell/phone within reach;with family/visitor present Nurse Communication: Mobility status PT Visit Diagnosis: Unsteadiness on feet (R26.81);Other abnormalities of gait and mobility (R26.89);Other symptoms and signs involving the nervous system (R29.898);Hemiplegia and hemiparesis Hemiplegia - Right/Left: Left Hemiplegia - dominant/non-dominant: Non-dominant Hemiplegia - caused by: Cerebral infarction     Time: 0830-0921 PT Time Calculation (min) (ACUTE ONLY): 51 min  Charges:  $Gait Training: 8-22 mins $Therapeutic Exercise: 8-22 mins $Therapeutic Activity: 8-22 mins                     Moriah Shawley, PT, DPT Acute Rehabilitation Services Secure chat  preferred Office #: [phone]    Berline Lopes 09/16/2021, 10:53 AM

## 2021-09-16 NOTE — Progress Notes (Addendum)
STROKE TEAM PROGRESS NOTE   INTERVAL HISTORY His family is at the bedside.    He is awake and alert sitting up in bed. Left arm able to slightly move against gravity, can not resist gravity. No movement in left fingers, no grip. Proximal strength > distal in LUE. Right leg able to lift. Left leg able to move with gravity eliminated.  Sensation equal. Neurologic exam and vital signs are stable. Planning to discharge to CIR.   MRI scan shows a large right frontal MCA branch infarct. CT angiogram gram showed calcific stenosis of right ICA around 40%.  There is also moderate atheromatous cavernous sinus stenosis on the right. Echocardiogram with severely elevated pulmonary artery systolic pressure, severe left atrial dilation, LVEF stable from prior echo at 55-60%. No shunt detected.   Recommend 30 day monitor for evaluation of atrial fibrillation.  Started Plavix 8/13, will hold off on DAPT due to chronic anemia and current Hgb 6.8.  Vitals:   09/15/21 1629 09/15/21 2005 09/16/21 0033 09/16/21 0435  BP: (!) 167/91 (!) 163/90 (!) 158/92 (!) 184/88  Pulse: (!) 56 (!) 56 (!) 54 (!) 54  Resp: 16 18 18 15   Temp: 98 F (36.7 C) 98 F (36.7 C) 97.9 F (36.6 C) 98.8 F (37.1 C)  TempSrc:  Oral Oral Oral  SpO2: 97% 100% 100% 100%  Weight:      Height:       CBC:  Recent Labs  Lab 09/14/21 1323 09/15/21 0330 09/16/21 0750  WBC 3.0* 2.7* 3.4*  NEUTROABS 2.0 1.8  --   HGB 6.8* 6.8* 7.7*  HCT 20.0* 21.3* 22.8*  MCV 102.6* 105.4* 102.2*  PLT 137* 135* 122   Basic Metabolic Panel:  Recent Labs  Lab 09/14/21 1323 09/14/21 2138 09/15/21 0329  NA 139  --  138  K 3.6  --  4.0  CL 102  --  101  CO2 29  --  26  GLUCOSE 95  --  95  BUN 23*  --  25*  CREATININE 7.52*  --  8.86*  CALCIUM 10.1  --  9.8  MG  --  2.1  --    Lipid Panel:  Recent Labs  Lab 09/14/21 2138  CHOL 126  TRIG 62  HDL 59  CHOLHDL 2.1  VLDL 12  LDLCALC 55   HgbA1c:  Recent Labs  Lab 09/14/21 2138   HGBA1C 4.1*   Urine Drug Screen: No results for input(s): "LABOPIA", "COCAINSCRNUR", "LABBENZ", "AMPHETMU", "THCU", "LABBARB" in the last 168 hours.  Alcohol Level  Recent Labs  Lab 09/14/21 1322  ETH <10   IMAGING past 24 hours ECHOCARDIOGRAM COMPLETE  Result Date: 09/15/2021    ECHOCARDIOGRAM REPORT   Patient Name:   James Wall [facility] Date of Exam: 09/15/2021 Medical Rec #:  [id number]           Height:       65.0 in Accession #:    [id number]          Weight:       187.0 lb Date of Birth:  [birth year]           BSA:          1.922 m Patient Age:    56 years            BP:           170/100 mmHg Patient Gender: M  HR:           56 bpm. Exam Location:  Inpatient Procedure: 2D Echo Indications:    stroke  History:        Patient has prior history of Echocardiogram examinations, most                 recent 09/13/2020. COPD and end stage renal disease,                 Arrythmias:first degree heart block; Risk Factors:Hypertension                 and Sleep Apnea.  Sonographer:    Johny Chess RDCS Referring Phys: [place]  1. Left ventricular ejection fraction, by estimation, is 55 to 60%. The left ventricle has normal function. The left ventricle has no regional wall motion abnormalities. There is moderate concentric left ventricular hypertrophy. Left ventricular diastolic parameters are consistent with Grade III diastolic dysfunction (restrictive). Elevated left atrial pressure.  2. Right ventricular systolic function is normal. The right ventricular size is normal. There is severely elevated pulmonary artery systolic pressure. The estimated right ventricular systolic pressure is 80.9 mmHg.  3. Left atrial size was severely dilated.  4. The mitral valve is normal in structure. Mild to moderate mitral valve regurgitation. No evidence of mitral stenosis.  5. Tricuspid valve regurgitation is mild to moderate.  6. The aortic valve is tricuspid. Aortic valve  regurgitation is not visualized. No aortic stenosis is present.  7. The inferior vena cava is dilated in size with <50% respiratory variability, suggesting right atrial pressure of 15 mmHg. Comparison(s): RVSP is worse than prior; stable LV function. FINDINGS  Left Ventricle: Left ventricular ejection fraction, by estimation, is 55 to 60%. The left ventricle has normal function. The left ventricle has no regional wall motion abnormalities. The left ventricular internal cavity size was normal in size. There is  moderate concentric left ventricular hypertrophy. Left ventricular diastolic parameters are consistent with Grade III diastolic dysfunction (restrictive). Elevated left atrial pressure. Right Ventricle: The right ventricular size is normal. No increase in right ventricular wall thickness. Right ventricular systolic function is normal. There is severely elevated pulmonary artery systolic pressure. The tricuspid regurgitant velocity is 4.36 m/s, and with an assumed right atrial pressure of 15 mmHg, the estimated right ventricular systolic pressure is 98.3 mmHg. Left Atrium: Left atrial size was severely dilated. Right Atrium: Right atrial size was normal in size. Pericardium: Trivial pericardial effusion is present. Mitral Valve: The mitral valve is normal in structure. Mild mitral annular calcification. Mild to moderate mitral valve regurgitation. No evidence of mitral valve stenosis. Tricuspid Valve: The tricuspid valve is normal in structure. Tricuspid valve regurgitation is mild to moderate. No evidence of tricuspid stenosis. Aortic Valve: The aortic valve is tricuspid. Aortic valve regurgitation is not visualized. No aortic stenosis is present. Pulmonic Valve: The pulmonic valve was normal in structure. Pulmonic valve regurgitation is not visualized. No evidence of pulmonic stenosis. Aorta: The aortic root and ascending aorta are structurally normal, with no evidence of dilitation. Venous: The inferior vena  cava is dilated in size with less than 50% respiratory variability, suggesting right atrial pressure of 15 mmHg. IAS/Shunts: No atrial level shunt detected by color flow Doppler.  LEFT VENTRICLE PLAX 2D LVIDd:         5.30 cm Diastology LVIDs:         3.70 cm LV e' medial:    5.33 cm/s LV PW:  1.40 cm LV E/e' medial:  23.8 LV IVS:        1.20 cm LV e' lateral:   4.35 cm/s                        LV E/e' lateral: 29.2  RIGHT VENTRICLE             IVC RV S prime:     11.90 cm/s  IVC diam: 2.40 cm TAPSE (M-mode): 1.8 cm LEFT ATRIUM              Index        RIGHT ATRIUM           Index LA diam:        4.80 cm  2.50 cm/m   RA Area:     16.20 cm LA Vol (A2C):   100.0 ml 52.02 ml/m  RA Volume:   41.90 ml  21.80 ml/m LA Vol (A4C):   113.0 ml 58.78 ml/m LA Biplane Vol: 106.0 ml 55.14 ml/m  AORTIC VALVE LVOT Vmax:   94.00 cm/s LVOT Vmean:  59.500 cm/s LVOT VTI:    0.209 m  AORTA Ao Asc diam: 3.20 cm MITRAL VALVE                TRICUSPID VALVE MV Area (PHT): 3.42 cm     TR Peak grad:   76.0 mmHg MV Decel Time: 222 msec     TR Vmax:        436.00 cm/s MR Peak grad: 108.6 mmHg MR Mean grad: 77.0 mmHg     SHUNTS MR Vmax:      521.00 cm/s   Systemic VTI: 0.21 m MR Vmean:     416.0 cm/s MV E velocity: 127.00 cm/s MV A velocity: 59.50 cm/s MV E/A ratio:  2.13 Rudean Haskell MD Electronically signed by Rudean Haskell MD Signature Date/Time: 09/15/2021/2:42:29 PM    Final     PHYSICAL EXAM  Temp:  [97.9 F (36.6 C)-98.8 F (37.1 C)] 98.8 F (37.1 C) (08/14 0435) Pulse Rate:  [54-56] 54 (08/14 0435) Resp:  [10-19] 15 (08/14 0435) BP: (141-184)/(74-100) 184/88 (08/14 0435) SpO2:  [94 %-100 %] 100 % (08/14 0435)  General - Well nourished, well developed, middle-aged African-American male in no apparent distress. Cardiovascular - Regular rhythm and rate.  Mental Status -  Level of arousal and orientation to time, place, and person were intact.Mild dysarthria, follows commands Language including  expression, naming, repetition, comprehension was assessed and found intact. Attention span and concentration were normal. Recent and remote memory were intact. Fund of Knowledge was assessed and was intact.  Cranial Nerves II - XII - II - Visual field intact OU. III, IV, VI - Extraocular movements intact. V - Facial sensation intact bilaterally. VII - Facial movement intact bilaterally. VIII - Hearing & vestibular intact bilaterally. X - Palate elevates symmetrically. XI - Chin turning & shoulder shrug intact bilaterally. XII - Tongue protrusion intact.  Motor Strength - The patient's strength was normal in right arm and right leg. Left arm 2/5 proximally, no grip on left left leg 2/5 proximally and distally.  Bulk was normal and fasciculations were absent.   Motor Tone - Muscle tone was assessed at the neck and appendages and was normal. Sensory - Light touch, temperature/pinprick were assessed and were symmetrical.   Coordination - The patient had normal movements in the hands and feet with no ataxia or dysmetria.  Tremor was absent.  Gait and Station - deferred.   ASSESSMENT/PLAN Mr. James Wall is a 56 y.o. male with history of  ESRD on dialysis Monday Wednesday Friday, prostate cancer, nonalcoholic cirrhosis, hypertension, hyperlipidemia, hypertensive heart disease, recent anemia requiring blood transfusion with chronic anemia in his history, presenting for evaluation of left-sided weakness that started suddenly on Wednesday.  He reports being in his usual state of health prior to Wednesday, when he started noticing difficulty using his left arm. This worsened to the point where he was having a hard time holding utensils with that arm.  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 Atrophy with moderate chronic  microvascular ischemic disease CTA head & neck 8/12 - 1. Negative CTA for large vessel occlusion or other emergent finding. 2. Bulky calcified plaque about the right carotid bulb/proximal right ICA with associated stenosis of up to 40% by NASCET criteria. 3. Atheromatous change about the carotid siphons with up to moderate stenoses about the para clinoid segments bilaterally. 4. Extensive plaque about the proximal right vertebral artery with associated severe stenosis at the pre foraminal right V1 segment. Right vertebral artery dominant. Left vertebral artery hypoplastic and terminates in PICA, with no significant contribution to the posterior circulation. 5. Layering bilateral pleural effusions with pulmonary interstitial edema within the visualized lungs, suggesting CHF. 6. Prominent adenopathy within the lower left neck and visualized mediastinum. Findings are nonspecific, and could be reactive in nature. Possible nodal metastatic disease or lymphoproliferative disorder could also have this appearance.   MRI 8/12: Acute nonhemorrhagic infarct involving the right precentral gyrus extending into the corona radiata and more anterior frontal lobe. Moderate atrophy and white matter disease likely reflects the sequela of chronic microvascular ischemia. Remote lacunar infarct of the left thalamus.  2D Echo ejection fraction 55 to 60%.  No wall motion abnormalities.  Recommend 30 day monitoring  LDL 55 HgbA1c 4.1 VTE prophylaxis - SCD's    Diet   Diet renal/carb modified with fluid restriction Diet-HS Snack? Nothing; Fluid restriction: 1200 mL Fluid; Room service appropriate? Yes; Fluid consistency: Thin   No antithrombotic prior to admission, now on clopidogrel 75 mg daily Alone due to severe anemia with hemoglobin of 6.8 and patient having prior history of intolerance to aspirin Therapy recommendations:  CIR Disposition:  CIR today   Hypertension Home meds:  norvasc, clonidine, hydralazine  isosorbide, metoprolo Stable Permissive hypertension (OK if < 220/120) but gradually normalize in 5-7 days Long-term BP goal normotensive  Hyperlipidemia Home meds:  none,  LDL 55, goal < 70 High intensity statin not indicated due to below goal Continue statin at discharge  Other Stroke Risk Factors Cigarette smoker advised to stop smoking ETOH use, alcohol level <10, advised to drink no more than 1 drink(s) a day Obesity, Body mass index is 31.12 kg/m., BMI >/= 30 associated with increased stroke risk, recommend weight loss, diet and exercise as appropriate  Coronary artery disease Migraines Obstructive sleep apnea, not on CPAP at home  Other Active Problems ESRD on HD  GERD COPD Anxiety/depression  Hospital day # 1  I have personally obtained history,examined this patient, reviewed notes, independently viewed imaging studies, participated in medical decision making and plan of care.ROS completed by me personally and pertinent positives fully documented  I have made any additions or clarifications directly to the above note. Agree with note above.  Continue  Plavix 75 mg daily alone given significant anemia with hemoglobin of 6.8 and high risk of bleeding.  Consider 30-day outpatient heart monitor for paroxysmal A-fib after discharge.  Stroke team will sign off.  Kindly call for questions  Antony Contras, MD Medical Director [place] Pager: [phone] 09/16/2021 1:43 PM  To contact Stroke Continuity provider, please refer to [email or web address] After hours, contact General Neurology

## 2021-09-16 NOTE — Discharge Summary (Shared)
[facility] Discharge Summary  Patient name: James Wall Medical record number: [id number] Date of birth: February 01, 1966 Age: 56 y.o. Gender: male Date of Admission: 09/14/2021  Date of Discharge: 09/17/2021  Admitting Physician: Wells Guiles, DO  Primary Care Provider: Wells Guiles, DO Consultants: neurology, nephrology, PT/OT, PM&R, SLP  Indication for Hospitalization: stroke  Brief Hospital Course:  James Wall is a 56 y.o. male who was admitted for acute non-hemorrhagic infarct who presented outside of the acute intervention window. Pertinent PMH/PSH includes ESRD on HD, anemia of renal disease, prostate cancer s/p radiation, HTN.   Stroke Left-sided weakness beginning 3 days prior to presentation to ED.  Presented out of acute intervention window.  MRI brain showed acute nonhemorrhagic infarct involving left precentral gyrus extending into corona radiata and more anterior frontal lobe in addition to remote lacunar infarct of left thalamus.  Started on clopidogrel monotherapy for stroke prophylaxis given his anemia of chronic disease (hgb baseline ~7). Neurology recommended 30-day heart monitor s/p discharge from hospital. PT/OT worked with patient and recommended CIR. He was discharged to CIR on 8/14.  Anemia of renal disease Chronic, presented with hemoglobin 6.8. Initially planned for blood transfusion, but he was asymptomatic and Hgb the following day was improved to 7.7 His baseline Hgb is around 7. He is not a candidate for EPO due to his history of prostate cancer. He receives iron infusions outpatient.   ESRD Chronic, stable. Receives dialysis MWF. Anuric except with few drops urine. On home sevelamer carbonate (Renvela), continued while admitted. Stable electrolytes, no signs of uremia. Did not require emergent dialysis. Received routine HD while admitted.   Essential hypertension Chronic, on multiple home medications. Placed on  home amlodipine, clonidine, hydralazine, metoprolol tartrate, Isordil.   PCP Follow-up Recommendations: On discharge please call [facility] to arrange 30-day monitor for the patient. F/u medication list for HTN Continue Plavix monotherapy for stroke ppx Consider recheck CBC F/u patient adherence to MWF dialysis regimen Should follow up with Neurology in 4-6 weeks.  Discharge Diagnoses/Problem List:  Stroke ESRD on dialysis Anemia of renal disease Essential hypertension  Disposition: transfer to CIR  Discharge Condition: stable  Discharge Exam: BP (!) 173/84 (BP Location: Right Arm)   Pulse 60   Temp 98.2 F (36.8 C)   Resp 18   Ht 5' 5"  (1.651 m)   Wt 80.2 kg   SpO2 99%   BMI 29.42 kg/m   Significant Procedures: none  Significant Labs and Imaging:  Recent Labs  Lab 09/16/21 0750 09/17/21 0545  WBC 3.4* 3.2*  HGB 7.7* 7.0*  HCT 22.8* 20.2*  PLT 151 138*   Recent Labs  Lab 09/17/21 0545  NA 136  K 3.9  CL 99  CO2 27  GLUCOSE 87  BUN 18  CREATININE 7.11*  CALCIUM 9.8  ALKPHOS 54  AST 11*  ALT 10  ALBUMIN 3.0*   Results/Tests Pending at Time of Discharge: none  Discharge Medications:  Allergies as of 09/16/2021       Reactions   Venofer  [ferric Oxide] Itching   Aspirin Other (See Comments)   Avoids due to renal disease;  Made congestion worse        Medication List     STOP taking these medications    chlorproMAZINE 10 MG tablet Commonly known as: THORAZINE   Combivent Respimat 20-100 MCG/ACT Aers respimat Generic drug: Ipratropium-Albuterol   hydrOXYzine 25 MG tablet Commonly known as: ATARAX   omeprazole 40 MG  capsule Commonly known as: PRILOSEC   ondansetron 8 MG tablet Commonly known as: ZOFRAN   oxybutynin 10 MG 24 hr tablet Commonly known as: Ditropan XL   pantoprazole 40 MG tablet Commonly known as: PROTONIX       TAKE these medications    amLODipine 10 MG tablet Commonly known as: NORVASC TAKE 1 TABLET(10  MG) BY MOUTH DAILY What changed: See the new instructions.   Anoro Ellipta 62.5-25 MCG/ACT Aepb Generic drug: umeclidinium-vilanterol Inhale 1 puff into the lungs daily. What changed:  when to take this reasons to take this   cloNIDine 0.3 mg/24hr patch Commonly known as: CATAPRES - Dosed in mg/24 hr Place 0.3 mg onto the skin once a week. Apply on Sundays   clopidogrel 75 MG tablet Commonly known as: PLAVIX Take 1 tablet (75 mg total) by mouth daily.   famotidine 10 MG tablet Commonly known as: PEPCID Take 1 tablet (10 mg total) by mouth daily as needed for heartburn.   ferric gluconate 250 mg in sodium chloride 0.9 % 250 mL Inject 250 mg into the vein every Monday, Wednesday, and Friday with hemodialysis.   isosorbide dinitrate 20 MG tablet Commonly known as: ISORDIL Take 1 tablet (20 mg total) by mouth 2 (two) times daily. What changed:  how much to take how to take this when to take this additional instructions   metoprolol tartrate 100 MG tablet Commonly known as: LOPRESSOR Take 1 tablet (100 mg total) by mouth 2 (two) times daily. What changed:  how much to take how to take this when to take this additional instructions   rosuvastatin 10 MG tablet Commonly known as: CRESTOR Take 1 tablet (10 mg total) by mouth daily.   sevelamer carbonate 800 MG tablet Commonly known as: RENVELA Take 1,600-3,200 mg by mouth See admin instructions. Take 2-4 tablets by mouth daily  with meals depending on meal size per patient   tamsulosin 0.4 MG Caps capsule Commonly known as: FLOMAX TAKE 2 CAPSULES(0.8 MG) BY MOUTH DAILY What changed: See the new instructions.       ASK your doctor about these medications    acetaminophen 500 MG tablet Commonly known as: TYLENOL Take 500 mg by mouth every 6 (six) hours as needed.   hydrALAZINE 25 MG tablet Commonly known as: APRESOLINE Take 1 tablet (25 mg total) by mouth 2 (two) times daily.       Discharge Instructions:  Please refer to Patient Instructions section of EMR for full details.  Patient was counseled important signs and symptoms that should prompt return to medical care, changes in medications, dietary instructions, activity restrictions, and follow up appointments.   Follow-Up Appointments:   Camelia Phenes, MD 09/16/2021, 8:38 AM PGY-1, [place] Upper-Level Resident Addendum   I have independently interviewed and examined the patient. I have discussed the above with the original author and agree with their documentation. My edits for correction/addition/clarification are included where appropriate. Please see also any attending notes.   Sharion Settler, DO PGY-3, [facility] 09/17/2021 8:38 AM  FPTS Service pager: [phone] (text pages welcome through [facility])

## 2021-09-16 NOTE — Progress Notes (Signed)
Inpatient Rehab Admissions Coordinator:    Met with patient and his sister, [name] at bedside. Discussed AIR goals, 3 hours of rehab per day and patient needing supervision level assistance at home at discharge. Patient and sister are interested in pursuing inpatient rehab at this time. Patient can be moved to AIR today if medically appropriate.   Amanda Cockayne, PT Admissions Coordinator [phone]

## 2021-09-17 DIAGNOSIS — I639 Cerebral infarction, unspecified: Secondary | ICD-10-CM | POA: Diagnosis not present

## 2021-09-17 LAB — COMPREHENSIVE METABOLIC PANEL
ALT: 10 U/L (ref 0–44)
AST: 11 U/L — ABNORMAL LOW (ref 15–41)
Albumin: 3 g/dL — ABNORMAL LOW (ref 3.5–5.0)
Alkaline Phosphatase: 54 U/L (ref 38–126)
Anion gap: 10 (ref 5–15)
BUN: 18 mg/dL (ref 6–20)
CO2: 27 mmol/L (ref 22–32)
Calcium: 9.8 mg/dL (ref 8.9–10.3)
Chloride: 99 mmol/L (ref 98–111)
Creatinine, Ser: 7.11 mg/dL — ABNORMAL HIGH (ref 0.61–1.24)
GFR, Estimated: 8 mL/min — ABNORMAL LOW (ref >60)
Glucose, Bld: 87 mg/dL (ref 70–99)
Potassium: 3.9 mmol/L (ref 3.5–5.1)
Sodium: 136 mmol/L (ref 135–145)
Total Bilirubin: 0.7 mg/dL (ref 0.3–1.2)
Total Protein: 5.9 g/dL — ABNORMAL LOW (ref 6.5–8.1)

## 2021-09-17 LAB — CBC WITH DIFFERENTIAL/PLATELET
Abs Immature Granulocytes: 0.01 10*3/uL (ref 0.00–0.07)
Basophils Absolute: 0 10*3/uL (ref 0.0–0.1)
Basophils Relative: 1 %
Eosinophils Absolute: 0.1 10*3/uL (ref 0.0–0.5)
Eosinophils Relative: 4 %
HCT: 20.2 % — ABNORMAL LOW (ref 39.0–52.0)
Hemoglobin: 7 g/dL — ABNORMAL LOW (ref 13.0–17.0)
Immature Granulocytes: 0 %
Lymphocytes Relative: 12 %
Lymphs Abs: 0.4 10*3/uL — ABNORMAL LOW (ref 0.7–4.0)
MCH: 34 pg (ref 26.0–34.0)
MCHC: 34.7 g/dL (ref 30.0–36.0)
MCV: 98.1 fL (ref 80.0–100.0)
Monocytes Absolute: 0.3 10*3/uL (ref 0.1–1.0)
Monocytes Relative: 9 %
Neutro Abs: 2.4 10*3/uL (ref 1.7–7.7)
Neutrophils Relative %: 74 %
Platelets: 138 10*3/uL — ABNORMAL LOW (ref 150–400)
RBC: 2.06 MIL/uL — ABNORMAL LOW (ref 4.22–5.81)
RDW: 14.8 % (ref 11.5–15.5)
WBC: 3.2 10*3/uL — ABNORMAL LOW (ref 4.0–10.5)
nRBC: 0 % (ref 0.0–0.2)

## 2021-09-17 LAB — HEPATITIS B SURFACE ANTIBODY, QUANTITATIVE: Hep B S AB Quant (Post): <3.1 m[IU]/mL — ABNORMAL LOW (ref >9.9)

## 2021-09-17 MED ORDER — PANTOPRAZOLE SODIUM 20 MG PO TBEC
20.0000 mg | DELAYED_RELEASE_TABLET | Freq: Every day | ORAL | Status: DC
  Administered 2021-09-17 – 2021-10-01 (×15): 20 mg via ORAL
  Filled 2021-09-17 (×15): qty 1

## 2021-09-17 NOTE — Progress Notes (Signed)
Inpatient Rehabilitation Admission Medication Review by a Pharmacist  A complete drug regimen review was completed for this patient to identify any potential clinically significant medication issues.  High Risk Drug Classes Is patient taking? 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    Other  PTA meds: Thorazine 10 mg po tid prn hiccups Hydroxyzine 25 mg po q8h prn itching Combivent Respimat 1 puff q6h prn wheezing Protonix 40 mf po bid w/ meals as needed for acid reflux (patient states he takes in this manner) Restart PTA meds when and if necessary during CIR admission or at time of discharge, if warranted     Clinically significant medication issues were identified that warrant physician communication and completion of prescribed/recommended actions by midnight of the next day:  No   Time spent performing this drug regimen review (minutes):  30   Earle Reome BS, PharmD, BCPS Clinical Pharmacist 09/17/2021 7:39 AM  Contact: [phone] after 3 PM  "Be curious, not judgmental..." -Jamal Maes

## 2021-09-17 NOTE — Progress Notes (Signed)
PROGRESS NOTE   Subjective/Complaints:  No issues overnite   ROS- + heartburn, neg N/V/D no CP or SOB  Objective:   ECHOCARDIOGRAM COMPLETE  Result Date: 09/15/2021    ECHOCARDIOGRAM REPORT   Patient Name:   James Wall [facility] Date of Exam: 09/15/2021 Medical Rec #:  [id number]           Height:       65.0 in Accession #:    [id number]          Weight:       187.0 lb Date of Birth:  Jul 17, 1965           BSA:          1.922 m Patient Age:    56 years            BP:           170/100 mmHg Patient Gender: M                   HR:           56 bpm. Exam Location:  Inpatient Procedure: 2D Echo Indications:    stroke  History:        Patient has prior history of Echocardiogram examinations, most                 recent 09/13/2020. COPD and end stage renal disease,                 Arrythmias:first degree heart block; Risk Factors:Hypertension                 and Sleep Apnea.  Sonographer:    Johny Chess RDCS Referring Phys: [place]  1. Left ventricular ejection fraction, by estimation, is 55 to 60%. The left ventricle has normal function. The left ventricle has no regional wall motion abnormalities. There is moderate concentric left ventricular hypertrophy. Left ventricular diastolic parameters are consistent with Grade III diastolic dysfunction (restrictive). Elevated left atrial pressure.  2. Right ventricular systolic function is normal. The right ventricular size is normal. There is severely elevated pulmonary artery systolic pressure. The estimated right ventricular systolic pressure is 70.3 mmHg.  3. Left atrial size was severely dilated.  4. The mitral valve is normal in structure. Mild to moderate mitral valve regurgitation. No evidence of mitral stenosis.  5. Tricuspid valve regurgitation is mild to moderate.  6. The aortic valve is tricuspid. Aortic valve regurgitation is not visualized. No aortic stenosis is present.   7. The inferior vena cava is dilated in size with <50% respiratory variability, suggesting right atrial pressure of 15 mmHg. Comparison(s): RVSP is worse than prior; stable LV function. FINDINGS  Left Ventricle: Left ventricular ejection fraction, by estimation, is 55 to 60%. The left ventricle has normal function. The left ventricle has no regional wall motion abnormalities. The left ventricular internal cavity size was normal in size. There is  moderate concentric left ventricular hypertrophy. Left ventricular diastolic parameters are consistent with Grade III diastolic dysfunction (restrictive). Elevated left atrial pressure. Right Ventricle: The right ventricular size is normal. No increase in right ventricular wall thickness. Right ventricular systolic function  is normal. There is severely elevated pulmonary artery systolic pressure. The tricuspid regurgitant velocity is 4.36 m/s, and with an assumed right atrial pressure of 15 mmHg, the estimated right ventricular systolic pressure is 81.0 mmHg. Left Atrium: Left atrial size was severely dilated. Right Atrium: Right atrial size was normal in size. Pericardium: Trivial pericardial effusion is present. Mitral Valve: The mitral valve is normal in structure. Mild mitral annular calcification. Mild to moderate mitral valve regurgitation. No evidence of mitral valve stenosis. Tricuspid Valve: The tricuspid valve is normal in structure. Tricuspid valve regurgitation is mild to moderate. No evidence of tricuspid stenosis. Aortic Valve: The aortic valve is tricuspid. Aortic valve regurgitation is not visualized. No aortic stenosis is present. Pulmonic Valve: The pulmonic valve was normal in structure. Pulmonic valve regurgitation is not visualized. No evidence of pulmonic stenosis. Aorta: The aortic root and ascending aorta are structurally normal, with no evidence of dilitation. Venous: The inferior vena cava is dilated in size with less than 50% respiratory  variability, suggesting right atrial pressure of 15 mmHg. IAS/Shunts: No atrial level shunt detected by color flow Doppler.  LEFT VENTRICLE PLAX 2D LVIDd:         5.30 cm Diastology LVIDs:         3.70 cm LV e' medial:    5.33 cm/s LV PW:         1.40 cm LV E/e' medial:  23.8 LV IVS:        1.20 cm LV e' lateral:   4.35 cm/s                        LV E/e' lateral: 29.2  RIGHT VENTRICLE             IVC RV S prime:     11.90 cm/s  IVC diam: 2.40 cm TAPSE (M-mode): 1.8 cm LEFT ATRIUM              Index        RIGHT ATRIUM           Index LA diam:        4.80 cm  2.50 cm/m   RA Area:     16.20 cm LA Vol (A2C):   100.0 ml 52.02 ml/m  RA Volume:   41.90 ml  21.80 ml/m LA Vol (A4C):   113.0 ml 58.78 ml/m LA Biplane Vol: 106.0 ml 55.14 ml/m  AORTIC VALVE LVOT Vmax:   94.00 cm/s LVOT Vmean:  59.500 cm/s LVOT VTI:    0.209 m  AORTA Ao Asc diam: 3.20 cm MITRAL VALVE                TRICUSPID VALVE MV Area (PHT): 3.42 cm     TR Peak grad:   76.0 mmHg MV Decel Time: 222 msec     TR Vmax:        436.00 cm/s MR Peak grad: 108.6 mmHg MR Mean grad: 77.0 mmHg     SHUNTS MR Vmax:      521.00 cm/s   Systemic VTI: 0.21 m MR Vmean:     416.0 cm/s MV E velocity: 127.00 cm/s MV A velocity: 59.50 cm/s MV E/A ratio:  2.13 Rudean Haskell MD Electronically signed by Rudean Haskell MD Signature Date/Time: 09/15/2021/2:42:29 PM    Final    Recent Labs    09/16/21 0750 09/17/21 0545  WBC 3.4* 3.2*  HGB 7.7* 7.0*  HCT 22.8* 20.2*  PLT 151 138*  Recent Labs    09/15/21 0329 09/17/21 0545  NA 138 136  K 4.0 3.9  CL 101 99  CO2 26 27  GLUCOSE 95 87  BUN 25* 18  CREATININE 8.86* 7.11*  CALCIUM 9.8 9.8   No intake or output data in the 24 hours ending 09/17/21 0749      Physical Exam: Vital Signs Blood pressure (!) 165/78, pulse (!) 58, temperature 98.4 F (36.9 C), temperature source Oral, resp. rate 16, height 5' 5"  (1.651 m), weight 85.1 kg, SpO2 98 %.   General: No acute distress Mood and affect  are appropriate Heart: Regular rate and rhythm no rubs murmurs or extra sounds Lungs: Clear to auscultation, breathing unlabored, no rales or wheezes Abdomen: Positive bowel sounds, soft nontender to palpation, nondistended Extremities: No clubbing, cyanosis, or edema Skin: No evidence of breakdown, no evidence of rash, HD cath site without erythema or fluctuance  Neurologic: Cranial nerves II through XII intact, motor strength is 5/5 in right  deltoid, bicep, tricep, grip, hip flexor, knee extensors, ankle dorsiflexor and plantar flexor 3- LUE, 2- LLE Sensory exam normal sensation to light touch and proprioception in bilateral upper and lower extremities  Musculoskeletal: Full range of motion in all 4 extremities. No joint swelling   Assessment/Plan: 1. Functional deficits which require 3+ hours per day of interdisciplinary therapy in a comprehensive inpatient rehab setting. Physiatrist is providing close team supervision and 24 hour management of active medical problems listed below. Physiatrist and rehab team continue to assess barriers to discharge/monitor patient progress toward functional and medical goals  Care Tool:  Bathing              Bathing assist       Upper Body Dressing/Undressing Upper body dressing        Upper body assist      Lower Body Dressing/Undressing Lower body dressing            Lower body assist       Toileting Toileting    Toileting assist       Transfers Chair/bed transfer  Transfers assist           Locomotion Ambulation   Ambulation assist              Walk 10 feet activity   Assist           Walk 50 feet activity   Assist           Walk 150 feet activity   Assist           Walk 10 feet on uneven surface  activity   Assist           Wheelchair     Assist               Wheelchair 50 feet with 2 turns activity    Assist            Wheelchair 150 feet  activity     Assist          Blood pressure (!) 165/78, pulse (!) 58, temperature 98.4 F (36.9 C), temperature source Oral, resp. rate 16, height 5' 5"  (1.651 m), weight 85.1 kg, SpO2 98 %.  Medical Problem List and Plan: 1. 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  Recommendations of 30-day monitor             -patient may shower             -  ELOS/Goals: 14 days, supervision goals 2.  Antithrombotics: -DVT/anticoagulation:  Mechanical: Antiembolism stockings, thigh (TED hose) Bilateral lower extremities             -antiplatelet therapy: Plavix 75 mg daily only.  DAPT held due to chronic anemia 3. Pain Management: N/A 4. Mood/Behavior/Sleep: Provide emotional support             -antipsychotic agents: N/A 5. Neuropsych/cognition: This patient is capable of making decisions on his own behalf. 6. Skin/Wound Care: Routine skin checks 7. Fluids/Electrolytes/Nutrition: Routine in and outs with follow-up chemistries 8.  Hypertension.  Norvasc 10 mg daily, clonidine patch 0.3 mg weekly, hydralazine 25 mg twice daily, Isordil 20 mg daily, Lopressor 100 mg twice daily.   Vitals:   09/17/21 0405 09/17/21 0726  BP: (!) 165/78   Pulse: (!) 57 (!) 58  Resp: 18 16  Temp: 98.4 F (36.9 C)   SpO2: 98%   ELevated BP- permissive for now   9.  End-stage renal disease.  Hemodialysis as directed per renal services             -HD at end of day to allow participation in therapies 10.  Hyperlipidemia.  Crestor 11.  Nonalcoholic cirrhosis with ascites.  Paracentesis 2014-2015. 12.  History of prostate cancer.  Followed outpatient by Dr. Gloriann Loan and recently completed radiation therapy.  Continue Flomax 13.  History of first-degree AV block.  No chest pain or shortness of breath 14.  Tobacco abuse.  Counseling 15.  COPD.  OSA.  Patient no longer on CPAP. 16.  Chronic anemia.  Plan IV iron weekly with ferric per hemodialysis, transfuse if < 7.0 ,  consider  if pt becomes orthostatic or tachy in therapy   17.  Hx of GERD, took a tablet at home for this will start PPI 18.  Mild thrombocytopenia, no bleeding monitor plt count no sq heparin for DVT prophyllaxis   LOS: 1 days A FACE TO FACE EVALUATION WAS PERFORMED  Charlett Blake 09/17/2021, 7:49 AM

## 2021-09-17 NOTE — Progress Notes (Signed)
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)    primary cancer is prostate   Anemia associated with chronic renal failure    Anxiety    Arthritis    Cirrhosis, nonalcoholic ([place])    per pt possible from a medication , unsure ;   last ultrasound 08-09-2020 in epic no fibrosis   COPD (chronic obstructive pulmonary disease) ([place])    Depression    ESRD on hemodialysis ([place]) 2009   Started HD Jan 2009;  ESRD secondary to hypertensive nephrosclerosis;  dialysis at [facility] on MWF   First degree heart block    GERD (gastroesophageal reflux disease)    Hiatal hernia    History of acute respiratory failure 07/2013   admission;  HCAP w/ ARF with hypoxia   History of adenomatous polyp of colon    History of ascites    s/p paracentesis 01-31-2013 (5L) and last one 03-28-2013 (2.7L)   History of community acquired pneumonia 08/08/2020   admission ; LLL , POA   History of heart murmur in childhood    History of MRSA infection 12/19/2012   hospital admission;  w/ sepsis MRSA bacteramia AVF infection   History of sepsis 03/2017   admission;   HCAP w/ sepsis   Hyperlipidemia    Hypertension    Hypertensive heart disease    cardiologist--- dr Osborne Oman;  nuclear stress test 06-16-2013 intermediate risk w/ mid-distal anterior wall ischemia , not gated;  cardiac cath 07-13-2013 in epic showed normal coronaries and LVSF,  ef not assessed, LCEDP 83mHg   Hypertensive nephrosclerosis, 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 ([place]    Intolerance  to cold    due to anemia   Malignant neoplasm prostate ([facility]    urologist--- dr bell/  radiation onologist--- dr mTammi Klippel  dx 01/ 2023,  Gleason 4+3, PSA 365  NICM (nonischemic cardiomyopathy) ([facility]    followed by cardiology;   last echo in epic 09-13-2020 ef 55-60%   OSA (obstructive sleep apnea) 2009   no  longer using cpap since the yr started 2009;   sleep study in epic 05-11-2007 severe osa   PSVT (paroxysmal supraventricular tachycardia) ([place]    event monitor 08-01-2019  SR w/ SVT runs , rare PAC/ PVC   Secondary hyperparathyroidism ([place]    Wears glasses    Past Surgical History:  Past Surgical History:  Procedure Laterality Date   AV FISTULA PLACEMENT Right 02/22/2013   Procedure:  CREATION  OF BRACHIAL CEPHALIC FISTULA RIGHT ARM;  Surgeon: CElam Dutch MD;  [facility]  Service: Vascular;  Laterality: Right;   AV FISTULA PLACEMENT Left 08/10/2014   Procedure: BASILIC VEIN TRANSPOSITION  ARTERIOVENOUS (AV) FISTULA CREATION LEFT UPPER ARM;  Surgeon: JMal Misty MD;  [place] [facility]  Service: Vascular;  Laterality: Left;   AV FISTULA PLACEMENT, RADIOCEPHALIC Left 029/92/4268  _0 ;  Left lower arm   COLONOSCOPY  11/30/2018   by dr bTarri Glenn  ESOPHAGOGASTRODUODENOSCOPY (EGD) WITH PROPOFOL N/A 04/12/2013   Procedure: ESOPHAGOGASTRODUODENOSCOPY (EGD) WITH PROPOFOL;  Surgeon: WArta Silence MD;  [facility];  Service: Endoscopy;  Laterality: N/A;   GOLD SEED IMPLANT N/A 06/11/2021   Procedure: GOLD SEED IMPLANT;  Surgeon: Janith Lima, MD;  [facility];  Service: Urology;  Laterality: N/A;   INSERTION OF DIALYSIS CATHETER N/A 12/23/2012   Procedure: INSERTION OF DIALYSIS CATHETER; ULTRASOUND GUIDED;  Surgeon: Angelia Mould, MD;  Location: [place];  Service: Vascular;  Laterality: N/A;   INSERTION OF DIALYSIS CATHETER  10/22/2015   Right IJ non-tunneled HD catheter, placed again in 2019   IR FLUORO GUIDE CV LINE RIGHT  08/18/2019   IR FLUORO  GUIDE CV LINE RIGHT  10/07/2019   IR GENERIC HISTORICAL  10/22/2015   IR US GUIDE VASC ACCESS RIGHT 10/22/2015 [facility]   IR GENERIC HISTORICAL  10/22/2015   IR FLUORO GUIDE CV LINE RIGHT 10/22/2015 [facility]   IR GENERIC HISTORICAL  10/23/2015   IR FLUORO GUIDE CV LINE RIGHT 10/23/2015 Marybelle Killings, MD [facility]   LEFT HEART CATHETERIZATION WITH CORONARY ANGIOGRAM N/A 07/13/2013   Procedure: LEFT HEART CATHETERIZATION WITH CORONARY ANGIOGRAM;  Surgeon: Jettie Booze, MD;  [facility] CATH LAB;  Service: Cardiovascular;  Laterality: N/A;   LIGATION OF ARTERIOVENOUS  FISTULA Left 12/22/2012   Procedure: LIGATION OF ARTERIOVENOUS  FISTULA;EXCISION OF LARGE ANEURYSMS;;  Surgeon: Elam Dutch, MD;  Location: [place];  Service: Vascular;  Laterality: Left;   SPACE OAR INSTILLATION N/A 06/11/2021   Procedure: SPACE OAR INSTILLATION;  Surgeon: Janith Lima, MD;  [facility];  Service: Urology;  Laterality: N/A;   UPPER GASTROINTESTINAL ENDOSCOPY  09/07/2018   by dr Tarri Glenn   Social History:  reports that he has been smoking cigarettes. He has a 8.50 pack-year smoking history. He has never used smokeless tobacco. He reports current alcohol use. He reports current drug use. Frequency: 4.00 times per week. Drug: Marijuana.  Family / Support Systems Other Supports: [name] (Sister), Junita Push (Friend) Anticipated Caregiver: [name], sister and Junita Push, friend Ability/Limitations of Caregiver: supervision to Min A Caregiver Availability: Intermittent Family Dynamics: support from friend and sister  Social History Preferred language: English Religion: Lucan - How often do you need to have someone help you when you read instructions, pamphlets, or other written material from your doctor or pharmacy?: Never Writes: Yes   Abuse/Neglect Abuse/Neglect Assessment Can Be Completed: Yes Physical Abuse: Denies Verbal Abuse: Denies Sexual Abuse:  Denies Exploitation of patient/patient's resources: Denies Self-Neglect: Denies  Patient response to: Social Isolation - How often do you feel lonely or isolated from those around you?: Never  Emotional Status Recent Psychosocial Issues: coping Psychiatric History: hx of anxiety and depression Substance Abuse History: tobacco  Patient / Family Perceptions, Expectations & Goals Pt/Family understanding of illness & functional limitations: yes Premorbid pt/family roles/activities: Independent Anticipated changes in roles/activities/participation: patient anticipating supervision/MOD I Pt/family expectations/goals: Warden/ranger Agencies: None Premorbid Home Care/DME Agencies: Other (Comment) ([facility]) Transportation available at discharge: friend able to transport Is the patient able to respond to transportation needs?: Yes In the past 12 months, has lack of transportation kept you from medical appointments or from getting medications?: No In the past 12 months, has lack of transportation kept you from meetings, work, or from getting things needed for daily living?: No Resource referrals recommended: Neuropsychology  Discharge Planning Living Arrangements: Alone Support Systems: Other relatives (friend and sister) Type of Residence: Private residence (1 step to enter) Insurance underwriter Resources: Information systems manager, Multimedia programmer (specify) Financial Resources: Family Support, SSD Financial  Screen Referred: No Living Expenses: Rent Money Management: Patient Does the patient have any problems obtaining your medications?: No Home Management: Independent Patient/Family Preliminary Plans: Patient plans to manage. Friend able to assist if needed Care Coordinator Barriers to Discharge: Hemodialysis, Lack of/limited family support, Decreased caregiver support Care Coordinator Anticipated Follow Up Needs: HH/OP DC Planning Additional Notes/Comments: HD Expected length of  stay: 14 Days  Clinical Impression SW met with patient, introduced self and explained role. Patient anticipates discharging back home with PRN assistance from his friend and sister. Patient was tearful during assessment discussing the family hx. The death of his mother and friend, patient added to neuro list.No additional questions or concerns.   Dyanne Iha 09/17/2021, 12:03 PM

## 2021-09-17 NOTE — Progress Notes (Signed)
Inpatient [place] Individual Statement of Services  Patient Name:  James Wall  Date:  09/17/2021  Welcome to the [facility].  Our goal is to provide you with an individualized program based on your diagnosis and situation, designed to meet your specific needs.  With this comprehensive rehabilitation program, you will be expected to participate in at least 3 hours of rehabilitation therapies Monday-Friday, with modified therapy programming on the weekends.  Your rehabilitation program will include the following services:  Physical Therapy (PT), Occupational Therapy (OT), Speech Therapy (ST), 24 hour per day rehabilitation nursing, Therapeutic Recreaction (TR), Neuropsychology, Care Coordinator, Rehabilitation Medicine, Nutrition Services, Pharmacy Services, and Other  Weekly team conferences will be held on Wednesdays to discuss your progress.  Your Inpatient Rehabilitation Care Coordinator will talk with you frequently to get your input and to update you on team discussions.  Team conferences with you and your family in attendance may also be held.  Expected length of stay: 14 Days  Overall anticipated outcome:  Supervision to MOD I  Depending on your progress and recovery, your program may change. Your Inpatient Rehabilitation Care Coordinator will coordinate services and will keep you informed of any changes. Your Inpatient Rehabilitation Care Coordinator's name and contact numbers are listed  below.  The following services may also be recommended but are not provided by the [place]:   [name] will be made to provide these services after discharge if needed.  Arrangements include referral to agencies that provide these services.  Your insurance has been verified to be:  Medicare A & B Your primary doctor is:  Wells Guiles, DO  Pertinent  information will be shared with your doctor and your insurance company.  Inpatient Rehabilitation Care Coordinator:  Erlene Quan, [place] or [phone]  Information discussed with and copy given to patient by: Dyanne Iha, 09/17/2021, 9:45 AM

## 2021-09-17 NOTE — Progress Notes (Signed)
This rn sent secure chat to Sharion Settler, DO to confirm transfusion for prbc has been discontinued. DO confirms transfusion discontinued d/t pt last hemoglobin level this am

## 2021-09-17 NOTE — Discharge Instructions (Addendum)
Inpatient Rehab Discharge Instructions  [place] Discharge date and time: No discharge date for patient encounter.   Activities/Precautions/ Functional Status: Activity: activity as tolerated Diet: renal diet Wound Care: Routine skin checks Functional status:  ___ No restrictions     ___ Walk up steps independently ___ 24/7 supervision/assistance   ___ Walk up steps with assistance ___ Intermittent supervision/assistance  ___ Bathe/dress independently ___ Walk with walker     __x_ Bathe/dress with assistance ___ Walk Independently    ___ Shower independently ___ Walk with assistance    ___ Shower with assistance ___ No alcohol     ___ Return to work/school ________  Special Instructions:  No driving smoking or alcohol   Continue hemodialysis as directedSTROKE/TIA DISCHARGE INSTRUCTIONS SMOKING Cigarette smoking nearly doubles your risk of having a stroke & is the single most alterable risk factor  If you smoke or have smoked in the last 12 months, you are advised to quit smoking for your health. Most of the excess cardiovascular risk related to smoking disappears within a year of stopping. Ask you doctor about anti-smoking medications [place] Quit Line: 1-800-QUIT NOW Free Smoking Cessation Classes [phone]  CHOLESTEROL Know your levels; limit fat & cholesterol in your diet  Lipid Panel     Component Value Date/Time   CHOL 126 09/14/2021 2138   TRIG 62 09/14/2021 2138   HDL 59 09/14/2021 2138   CHOLHDL 2.1 09/14/2021 2138   VLDL 12 09/14/2021 2138   [place] 55 09/14/2021 2138     Many patients benefit from treatment even if their cholesterol is at goal. Goal: Total Cholesterol (CHOL) less than 160 Goal:  Triglycerides (TRIG) less than 150 Goal:  HDL greater than 40 Goal:  LDL (LDLCALC) less than 100   BLOOD PRESSURE American Stroke Association blood pressure target is less that 120/80 mm/Hg  Your discharge blood pressure is:  BP: (!) 165/78 Monitor your  blood pressure Limit your salt and alcohol intake Many individuals will require more than one medication for high blood pressure  DIABETES (A1c is a blood sugar average for last 3 months) Goal HGBA1c is under 7% (HBGA1c is blood sugar average for last 3 months)  Diabetes: No known diagnosis of diabetes    Lab Results  Component Value Date   HGBA1C 4.1 (L) 09/14/2021    Your HGBA1c can be lowered with medications, healthy diet, and exercise. Check your blood sugar as directed by your physician Call your physician if you experience unexplained or low blood sugars.  PHYSICAL ACTIVITY/REHABILITATION Goal is 30 minutes at least 4 days per week  Activity: Increase activity slowly, Therapies: Physical Therapy: Home Health Return to work:  Activity decreases your risk of heart attack and stroke and makes your heart stronger.  It helps control your weight and blood pressure; helps you relax and can improve your mood. Participate in a regular exercise program. Talk with your doctor about the best form of exercise for you (dancing, walking, swimming, cycling).  DIET/WEIGHT Goal is to maintain a healthy weight  Your discharge diet is:  Diet Order             Diet renal/carb modified with fluid restriction Diet-HS Snack? Nothing; Fluid restriction: 1200 mL Fluid; Room service appropriate? Yes; Fluid consistency: Thin  Diet effective now                   liquids Your height is:  Height: 5' 5"  (165.1 cm) Your current weight is: Weight: 85.1  kg Your Body Mass Index (BMI) is:  BMI (Calculated): 31.22 Following the type of diet specifically designed for you will help prevent another stroke. Your goal weight range is:   Your goal Body Mass Index (BMI) is 19-24. Healthy food habits can help reduce 3 risk factors for stroke:  High cholesterol, hypertension, and excess weight.  RESOURCES Stroke/Support Group:  Call [phone]   STROKE EDUCATION PROVIDED/REVIEWED AND GIVEN TO PATIENT Stroke  warning signs and symptoms How to activate emergency medical system (call 911). Medications prescribed at discharge. Need for follow-up after discharge. Personal risk factors for stroke. Pneumonia vaccine given: No Flu vaccine given: No My questions have been answered, the writing is legible, and I understand these instructions.  I will adhere to these goals & educational materials that have been provided to me after my discharge from the hospital.      My questions have been answered and I understand these instructions. I will adhere to these goals and the provided educational materials after my discharge from the hospital.  Patient/Caregiver Signature _______________________________ Date __________  Clinician Signature _______________________________________ Date __________  Please bring this form and your medication list with you to all your follow-up doctor's appointments.

## 2021-09-17 NOTE — Evaluation (Signed)
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 [facility])    primary cancer is prostate   Anemia associated with chronic renal failure    Anxiety    Arthritis    Cirrhosis, nonalcoholic ([place])    per pt possible from a medication , unsure ;   last ultrasound 08-09-2020 in epic no fibrosis   COPD (chronic obstructive pulmonary disease) ([place])    Depression    ESRD on hemodialysis ([place]) 2009   Started HD Jan 2009;  ESRD secondary to hypertensive nephrosclerosis;  dialysis at [facility] at [facility] on MWF   First degree heart block    GERD (gastroesophageal reflux disease)    Hiatal hernia    History of acute respiratory failure 07/2013   admission;  HCAP w/ ARF with hypoxia   History of adenomatous polyp of colon    History of ascites    s/p paracentesis 01-31-2013 (5L) and last one 03-28-2013 (2.7L)   History of community acquired pneumonia 08/08/2020   admission ; LLL , POA   History of heart murmur in childhood    History of MRSA infection 12/19/2012   hospital admission;  w/ sepsis MRSA bacteramia AVF infection   History of sepsis 03/2017   admission;   HCAP w/ sepsis   Hyperlipidemia    Hypertension    Hypertensive heart disease    cardiologist--- dr Osborne Oman;  nuclear stress test 06-16-2013 intermediate risk w/ mid-distal anterior wall ischemia , not gated;  cardiac cath 07-13-2013 in epic showed normal coronaries  and LVSF,  ef not assessed, LCEDP 48mHg   Hypertensive nephrosclerosis, 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 ([place]    Intolerance to cold    due to anemia   Malignant neoplasm prostate ([facility]    urologist--- dr bell/  radiation onologist--- dr mTammi Klippel  dx 01/ 2023,  Gleason 4+3, PSA 378  NICM (nonischemic cardiomyopathy) ([place]    followed by cardiology;   last echo in epic 09-13-2020 ef 55-60%   OSA (obstructive sleep apnea) 2009   no  longer using cpap since the yr started 2009;   sleep study in epic 05-11-2007 severe osa   PSVT ([place]oxysmal [place]ventricular tachy[place]) [place]    event monitor 08-01-2019  SR w/ SVT runs , rare PAC/ PVC   [place]    Wears glasses    Past Surgical History:  Past Surgical History:  Procedure Laterality Date   AV FISTULA PLACEMENT Right 02/22/2013   Procedure:  CREATION  OF BRACHIAL CEPHALIC FISTULA RIGHT ARM;  Surgeon: CElam Dutch MD;  [place]  Service: Vascular;  Laterality: Right;   AV FISTULA PLACEMENT Left 08/10/2014   Procedure: BASILIC VEIN TRANSPOSITION  ARTERIOVENOUS (AV) FISTULA CREATION LEFT UPPER ARM;  Surgeon: JMal Misty MD;  [place]  Service: Vascular;  Laterality: Left;   AV FISTULA PLACEMENT, RADIOCEPHALIC Left 075/88/3254  _0 ;  Left lower arm   COLONOSCOPY  11/30/2018   by  dr Tarri Glenn   ESOPHAGOGASTRODUODENOSCOPY (EGD) WITH PROPOFOL N/A 04/12/2013   Procedure: ESOPHAGOGASTRODUODENOSCOPY (EGD) WITH PROPOFOL;  Surgeon: Arta Silence, MD;  [facility];  Service: Endoscopy;  Laterality: N/A;   GOLD SEED IMPLANT N/A 06/11/2021   Procedure: GOLD SEED IMPLANT;  Surgeon: Janith Lima, MD;  [facility];  Service: Urology;  Laterality: N/A;   INSERTION OF DIALYSIS CATHETER N/A 12/23/2012   Procedure: INSERTION OF DIALYSIS CATHETER; ULTRASOUND GUIDED;  Surgeon: Angelia Mould, MD;  Location: [place];  Service: Vascular;  Laterality: N/A;   INSERTION  OF DIALYSIS CATHETER  10/22/2015   Right IJ non-tunneled HD catheter, placed again in 2019   IR FLUORO GUIDE CV LINE RIGHT  08/18/2019   IR FLUORO GUIDE CV LINE RIGHT  10/07/2019   IR GENERIC HISTORICAL  10/22/2015   IR US GUIDE VASC ACCESS RIGHT 10/22/2015 [facility]   IR GENERIC HISTORICAL  10/22/2015   IR FLUORO GUIDE CV LINE RIGHT 10/22/2015 [facility]   IR GENERIC HISTORICAL  10/23/2015   IR FLUORO GUIDE CV LINE RIGHT 10/23/2015 Marybelle Killings, MD [facility]   LEFT HEART CATHETERIZATION WITH CORONARY ANGIOGRAM N/A 07/13/2013   Procedure: LEFT HEART CATHETERIZATION WITH CORONARY ANGIOGRAM;  Surgeon: Jettie Booze, MD;  Location: [facility] CATH LAB;  Service: Cardiovascular;  Laterality: N/A;   LIGATION OF ARTERIOVENOUS  FISTULA Left 12/22/2012   Procedure: LIGATION OF ARTERIOVENOUS  FISTULA;EXCISION OF LARGE ANEURYSMS;;  Surgeon: Elam Dutch, MD;  Location: [place];  Service: Vascular;  Laterality: Left;   SPACE OAR INSTILLATION N/A 06/11/2021   Procedure: SPACE OAR INSTILLATION;  Surgeon: Janith Lima, MD;  [facility];  Service: Urology;  Laterality: N/A;   UPPER GASTROINTESTINAL ENDOSCOPY  09/07/2018   by dr Tarri Glenn    Assessment / Plan / Recommendation Clinical Impression James Wall is a 56 year old right-handed male with history of chronic anemia, nonalcoholic cirrhosis as well as ascites with paracentesis 2014-2015, COPD/OSA no longer on CPAP, end-stage renal disease with hemodialysis since 2009, hypertension, prostate cancer followed by Dr. Gloriann Loan, first-degree AV block, hypertension, hyperlipidemia, tobacco use.  Per chart review patient lives alone.  1 level home one-step to entry.  He takes SCAT to hemodialysis.  He has a sister and friends to take him to the grocery store when needed.  Presented 09/14/2021 with with left arm weakness as well as dysarthric speech 09/11/2021 and patient did not initially seek medical attention.  He was having a hard time holding his  utensils.  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  Remote lacunar infarct of the left thalamus.  Renal service follow-up with hemodialysis ongoing as directed. Tolerating a regular consistency diet.  Therapy evaluations completed due to patient's left-sided weakness dysarthric speech was admitted for a comprehensive rehab program.  [facility] Mental Status Examination was completed to evaluate the pt's cognitive-linguistic skills. Pt achieved a score of 22/30 which is below the normal limits of 27 or more out of 30 and is suggestive of a mild impairment. Pt exhibited decreased word recall, mental manipulation, planning/organization with clock drawing, and higher level problem solving skills. Expressive/receptive language skills were grossly intact for all tasks assessed. Verbal output was verbose and pt benefited from intermittent verbal redirection for topic maintenance. Pt was also known to repeat the same story on occasion without awareness. Speech was slightly dysarthric as evidenced by imprecise consonant production, however pt  was intelligible at the conversation level and appeared intentional about speaking with slow rate and over-articulation. Pt reports increased "slurred" speech when fatigued. This was minimally observed during session. Pt is consuming a regular diet with thin liquids and denied acute chewing/swallowing difficulties since onset of CVA. He endorsed acid reflux which is being managed with medication. Pt would benefit from skilled ST services to address higher level cognitive deficits and follow up education on speech strategies to maximize functional independence.  Pt participated in [place] Status Examination (SLUMS) as well as further non-standardized assessments of cognitive-linguistic, speech, and language function. Please see above.       Skilled Therapeutic Interventions          Pt participated in [place] Status Examination (SLUMS) as well as further non-standardized assessments of cognitive-linguistic, speech, and language function. Please see above.     SLP Assessment  Patient will need skilled [facility] Pathology Services during CIR admission    Recommendations  Recommendations for Other Services: Neuropsych consult Patient destination: Home Follow up Recommendations: Outpatient SLP;Home Health SLP Equipment Recommended: None recommended by SLP    SLP Frequency 3 to 5 out of 7 days   SLP Duration  SLP Intensity  SLP Treatment/Interventions 10-14 days  Minumum of 1-2 x/day, 30 to 90 minutes  Cognitive remediation/compensation;Functional tasks;Patient/family education;Speech/Language facilitation;Therapeutic Activities    Pain Pain Assessment Pain Scale: 0-10 Pain Score: 0-No pain  Prior Functioning Cognitive/Linguistic Baseline: Within functional limits Type of Home: House  Lives With: Alone Available Help at Discharge: Family;Friend(s);Neighbor;Available PRN/intermittently Education: 12th grade Vocation: On disability  SLP Evaluation Cognition Overall Cognitive Status: Impaired/Different from baseline Arousal/Alertness: Awake/alert Orientation Level: Oriented X4  Comprehension Auditory Comprehension Overall Auditory Comprehension: Appears within functional limits for tasks assessed Expression Expression Primary Mode of Expression: Verbal Verbal Expression Overall Verbal Expression: Appears within functional limits for tasks assessed Pragmatics: Impairment Impairments: Other (comment) (verbose) Interfering Components: Attention Written Expression Dominant Hand: Right Oral Motor Oral Motor/Sensory Function Overall Oral Motor/Sensory Function: Mild impairment Facial ROM: Reduced left Facial Symmetry: Abnormal symmetry left Facial Strength: Reduced left Lingual  Symmetry: Within Functional Limits Lingual Strength: Reduced Lingual Sensation: Within Functional Limits Velum: Within Functional Limits Mandible: Within Functional Limits Motor Speech Overall Motor Speech: Impaired Respiration: Within functional limits Phonation: Normal Resonance: Within functional limits Articulation: Impaired Level of Impairment: Conversation Intelligibility: Intelligible Word: 75-100% accurate Phrase: 75-100% accurate Sentence: 75-100% accurate Motor Planning: Witnin functional limits Motor Speech Errors: Not applicable Effective Techniques: Slow rate;Over-articulate  Care Tool Care Tool Cognition Ability to hear (with hearing aid or hearing appliances if normally used Ability to hear (with hearing aid or hearing appliances if normally used): 0. Adequate - no difficulty in normal conservation, social interaction, listening to TV   Expression of Ideas and Wants Expression of Ideas and Wants: 3. Some difficulty - exhibits some difficulty with expressing needs and ideas (e.g, some words or finishing thoughts) or speech is not clear   Understanding Verbal and Non-Verbal Content Understanding Verbal and Non-Verbal Content: 3. Usually understands - understands most conversations, but misses some part/intent of message. Requires cues at times to understand  Memory/Recall Ability Memory/Recall Ability : Current season;That he or she is in a hospital/hospital unit   PMSV Assessment  PMSV Trial Intelligibility: Intelligible Word: 75-100% accurate Phrase: 75-100% accurate Sentence: 75-100% accurate  Short Term Goals: Week 1: SLP Short Term Goal 1 (Week 1): Pt will utilize speech intelligiblity strategies at the conversational level with mod  I SLP Short Term Goal 2 (Week 1): Patient will complete mildly complex problem solving tasks with min-to-mod A verbal/visual cues SLP Short Term Goal 3 (Week 1): Patient will utilize external memory strategies to recall novel  information with min A SLP Short Term Goal 4 (Week 1): Patient will demonstrate awareness to errors during functional cognitive tasks with min A  Refer to Care Plan for Long Term Goals  Recommendations for other services: Neuropsych  Discharge Criteria: Patient will be discharged from SLP if patient refuses treatment 3 consecutive times without medical reason, if treatment goals not met, if there is a change in medical status, if patient makes no progress towards goals or if patient is discharged from hospital.  The above assessment, treatment plan, treatment alternatives and goals were discussed and mutually agreed upon: by patient  Patty Sermons 09/17/2021, 4:33 PM

## 2021-09-17 NOTE — Progress Notes (Addendum)
[facility] Progress Note    Assessment/ Plan:   Subacute CVA -symptom onset 8/11, out of window for intervention. Rt MCA CVA w/ lt sided weakness -per primary service, in CIR  ESRD -on HD MWF, will c/w MWF schedule here  HTN -resume home meds -UF as tolerated  CKD MBD -c/w renvela & renal diet  Anemia of CKD -not on ESAs due to prostate Ca -receiving Fe load -transfuse prn for hgb <7  Prostate Ca -just completed XRT as an outpatient  OP HD orders: [place] MWF 4h  400/800  85kg  2/2 bath  P2  Heparin 4500 then 2050mdrun prn  RIJ TDC - last hep B labs: pend - sodium ferric gluconate 125 mg weekly IV, last 8/11 - doxercalciferol 4 ug IV tiw - last HD 8/11, post wt 85.1kg - last Hb 8.4 on 8/9, ferritin 318 on 7/19  Subjective:   Pt seen and examined in room. Tolerated HD and IV Fe yesterday. Now in CIR.    Objective:   BP (!) 165/78 (BP Location: Right Arm)   Pulse (!) 58   Temp 98.4 F (36.9 C) (Oral)   Resp 16   Ht 5' 5"  (1.651 m)   Wt 85.1 kg   SpO2 98%   BMI 31.22 kg/m  No intake or output data in the 24 hours ending 09/17/21 0855 Weight change:   Physical Exam: Gen:NAD CVS:RRR Resp:normal WOB, CTA BL AWUX:LKGMExt: no sig edema b/l LEs Neuro: left sided weakness noted, awake/alert [facility] Imaging: ECHOCARDIOGRAM COMPLETE  Result Date: 09/15/2021    ECHOCARDIOGRAM REPORT   [facility] Medical Rec #:  [id number]          Height:       65.0 in Accession #:    [id number]         Weight:       187.0 lb Date of Birth:  507/18/1967          BSA:          1.922 m Patient Age:    89years            BP:           170/100 mmHg Patient Gender: M                   HR:           56 bpm. Exam Location:  Inpatient Procedure: 2D Echo Indications:    stroke  History:        Patient has prior history of Echocardiogram examinations, most                 recent 09/13/2020. COPD and end stage  renal disease,                 Arrythmias:first degree heart block; Risk Factors:Hypertension                 and Sleep Apnea.  Sonographer:    LJohny ChessRDCS Referring Phys: [email or web address] 1. Left ventricular ejection fraction, by estimation, is 55 to 60%. The left ventricle has normal function. The left ventricle has no regional wall motion abnormalities. There is moderate concentric left ventricular hypertrophy. Left ventricular diastolic parameters are consistent with Grade III diastolic dysfunction (restrictive). Elevated left atrial pressure.  2. Right ventricular systolic function is normal. The right ventricular  size is normal. There is severely elevated pulmonary artery systolic pressure. The estimated right ventricular systolic pressure is 83.1 mmHg.  3. Left atrial size was severely dilated.  4. The mitral valve is normal in structure. Mild to moderate mitral valve regurgitation. No evidence of mitral stenosis.  5. Tricuspid valve regurgitation is mild to moderate.  6. The aortic valve is tricuspid. Aortic valve regurgitation is not visualized. No aortic stenosis is present.  7. The inferior vena cava is dilated in size with <50% respiratory variability, suggesting right atrial pressure of 15 mmHg. Comparison(s): RVSP is worse than prior; stable LV function. FINDINGS  Left Ventricle: Left ventricular ejection fraction, by estimation, is 55 to 60%. The left ventricle has normal function. The left ventricle has no regional wall motion abnormalities. The left ventricular internal cavity size was normal in size. There is  moderate concentric left ventricular hypertrophy. Left ventricular diastolic parameters are consistent with Grade III diastolic dysfunction (restrictive). Elevated left atrial pressure. Right Ventricle: The right ventricular size is normal. No increase in right ventricular wall thickness. Right ventricular systolic function is normal. There is severely elevated  pulmonary artery systolic pressure. The tricuspid regurgitant velocity is 4.36 m/s, and with an assumed right atrial pressure of 15 mmHg, the estimated right ventricular systolic pressure is 51.7 mmHg. Left Atrium: Left atrial size was severely dilated. Right Atrium: Right atrial size was normal in size. Pericardium: Trivial pericardial effusion is present. Mitral Valve: The mitral valve is normal in structure. Mild mitral annular calcification. Mild to moderate mitral valve regurgitation. No evidence of mitral valve stenosis. Tricuspid Valve: The tricuspid valve is normal in structure. Tricuspid valve regurgitation is mild to moderate. No evidence of tricuspid stenosis. Aortic Valve: The aortic valve is tricuspid. Aortic valve regurgitation is not visualized. No aortic stenosis is present. Pulmonic Valve: The pulmonic valve was normal in structure. Pulmonic valve regurgitation is not visualized. No evidence of pulmonic stenosis. Aorta: The aortic root and ascending aorta are structurally normal, with no evidence of dilitation. Venous: The inferior vena cava is dilated in size with less than 50% respiratory variability, suggesting right atrial pressure of 15 mmHg. IAS/Shunts: No atrial level shunt detected by color flow Doppler.  LEFT VENTRICLE PLAX 2D LVIDd:         5.30 cm Diastology LVIDs:         3.70 cm LV e' medial:    5.33 cm/s LV PW:         1.40 cm LV E/e' medial:  23.8 LV IVS:        1.20 cm LV e' lateral:   4.35 cm/s                        LV E/e' lateral: 29.2  RIGHT VENTRICLE             IVC RV S prime:     11.90 cm/s  IVC diam: 2.40 cm TAPSE (M-mode): 1.8 cm LEFT ATRIUM              Index        RIGHT ATRIUM           Index LA diam:        4.80 cm  2.50 cm/m   RA Area:     16.20 cm LA Vol (A2C):   100.0 ml 52.02 ml/m  RA Volume:   41.90 ml  21.80 ml/m LA Vol (A4C):   113.0 ml 58.78 ml/m LA Biplane  Vol: 106.0 ml 55.14 ml/m  AORTIC VALVE LVOT Vmax:   94.00 cm/s LVOT Vmean:  59.500 cm/s LVOT VTI:     0.209 m  AORTA Ao Asc diam: 3.20 cm MITRAL VALVE                TRICUSPID VALVE MV Area (PHT): 3.42 cm     TR Peak grad:   76.0 mmHg MV Decel Time: 222 msec     TR Vmax:        436.00 cm/s MR Peak grad: 108.6 mmHg MR Mean grad: 77.0 mmHg     SHUNTS MR Vmax:      521.00 cm/s   Systemic VTI: 0.21 m MR Vmean:     416.0 cm/s MV E velocity: 127.00 cm/s MV A velocity: 59.50 cm/s MV E/A ratio:  2.13 Rudean Haskell MD Electronically signed by Rudean Haskell MD Signature Date/Time: 09/15/2021/2:42:29 PM    Final     Labs: BMET Recent Labs  Lab 09/14/21 1323 09/15/21 0329 09/17/21 0545  NA 139 138 136  K 3.6 4.0 3.9  CL 102 101 99  CO2 29 26 27   GLUCOSE 95 95 87  BUN 23* 25* 18  CREATININE 7.52* 8.86* 7.11*  CALCIUM 10.1 9.8 9.8   CBC Recent Labs  Lab 09/14/21 1323 09/15/21 0330 09/16/21 0750 09/17/21 0545  WBC 3.0* 2.7* 3.4* 3.2*  NEUTROABS 2.0 1.8  --  2.4  HGB 6.8* 6.8* 7.7* 7.0*  HCT 20.0* 21.3* 22.8* 20.2*  MCV 102.6* 105.4* 102.2* 98.1  PLT 137* 135* 151 138*    Medications:     amLODipine  10 mg Oral QHS   [START ON 09/22/2021] cloNIDine  0.3 mg Transdermal Q Sun   clopidogrel  75 mg Oral Daily   hydrALAZINE  25 mg Oral BID   isosorbide dinitrate  20 mg Oral BID   metoprolol tartrate  100 mg Oral BID   pantoprazole  20 mg Oral Daily   rosuvastatin  10 mg Oral Daily   sevelamer carbonate  3,200 mg Oral TID WC   tamsulosin  0.4 mg Oral QPC supper   umeclidinium-vilanterol  1 puff Inhalation Daily      Gean Quint, MD [facility] 09/17/2021, 8:55 AM

## 2021-09-17 NOTE — Progress Notes (Signed)
Inpatient Rehabilitation  Patient information reviewed and entered into eRehab system by Barbarann Kelly M. Morio Widen, M.A., CCC/SLP, PPS Coordinator.  Information including medical coding, functional ability and quality indicators will be reviewed and updated through discharge.    

## 2021-09-17 NOTE — Evaluation (Signed)
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 [facility])    primary cancer is prostate   Anemia associated with chronic renal failure    Anxiety    Arthritis    Cirrhosis, nonalcoholic ([place])    per pt possible from a medication , unsure ;   last ultrasound 08-09-2020 in epic no fibrosis   COPD (chronic obstructive pulmonary disease) ([place])    Depression    ESRD on hemodialysis ([place]) 2009   Started HD Jan 2009;  ESRD secondary to hypertensive nephrosclerosis;  dialysis at [facility] at [facility] on MWF   First degree heart block    GERD (gastroesophageal reflux disease)    Hiatal hernia    History of acute respiratory failure 07/2013   admission;  HCAP w/ ARF with hypoxia   History of adenomatous polyp of colon    History of ascites    s/p paracentesis 01-31-2013 (5L) and last one 03-28-2013 (2.7L)   History of community acquired pneumonia 08/08/2020   admission ; LLL , POA   History of heart murmur in childhood    History of MRSA infection 12/19/2012   hospital admission;  w/ sepsis MRSA bacteramia AVF infection   History of sepsis 03/2017   admission;   HCAP w/ sepsis   Hyperlipidemia    Hypertension    Hypertensive heart disease    cardiologist--- dr Osborne Oman;  nuclear stress test 06-16-2013 intermediate risk w/ mid-distal anterior wall ischemia , not gated;  cardiac cath 07-13-2013 in epic  showed normal coronaries and LVSF,  ef not assessed, LCEDP 52mHg   Hypertensive nephrosclerosis, 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 ([place]    Intolerance to cold    due to anemia   Malignant neoplasm prostate ([facility]    urologist--- dr bell/  radiation onologist--- dr mTammi Klippel  dx 01/ 2023,  Gleason 4+3, PSA 318  NICM (nonischemic cardiomyopathy) ([place]    followed by cardiology;   last echo in epic 09-13-2020 ef 55-60%   OSA (obstructive sleep apnea) 2009   no  longer using cpap since the yr started 2009;   sleep study in epic 05-11-2007 severe osa   PSVT (paroxysmal supraventricular tachycardia) ([place]    event monitor 08-01-2019  SR w/ SVT runs , rare PAC/ PVC   Secondary hyperparathyroidism ([place]    Wears glasses    Past Surgical History:  Past Surgical History:  Procedure Laterality Date   AV FISTULA PLACEMENT Right 02/22/2013   Procedure:  CREATION  OF BRACHIAL CEPHALIC FISTULA RIGHT ARM;  Surgeon: CElam Dutch MD;  [place]  Service: Vascular;  Laterality: Right;   AV FISTULA PLACEMENT Left 08/10/2014   Procedure: BASILIC VEIN TRANSPOSITION  ARTERIOVENOUS (AV) FISTULA CREATION LEFT UPPER ARM;  Surgeon: JMal Misty MD;  [place]  Service: Vascular;  Laterality: Left;   AV FISTULA PLACEMENT, RADIOCEPHALIC Left 086/75/4492  _0 ;  Left lower arm   COLONOSCOPY  11/30/2018   by dr Tarri Glenn   ESOPHAGOGASTRODUODENOSCOPY (EGD) WITH PROPOFOL N/A 04/12/2013   Procedure: ESOPHAGOGASTRODUODENOSCOPY (EGD) WITH PROPOFOL;  Surgeon: Arta Silence, MD;  [facility];  Service: Endoscopy;  Laterality: N/A;   GOLD SEED IMPLANT N/A 06/11/2021   Procedure: GOLD SEED IMPLANT;  Surgeon: Janith Lima, MD;  [facility];  Service: Urology;  Laterality: N/A;   INSERTION OF DIALYSIS CATHETER N/A 12/23/2012   Procedure: INSERTION OF DIALYSIS CATHETER; ULTRASOUND GUIDED;  Surgeon: Angelia Mould, MD;  Location: [place];  Service: Vascular;   Laterality: N/A;   INSERTION OF DIALYSIS CATHETER  10/22/2015   Right IJ non-tunneled HD catheter, placed again in 2019   IR FLUORO GUIDE CV LINE RIGHT  08/18/2019   IR FLUORO GUIDE CV LINE RIGHT  10/07/2019   IR GENERIC HISTORICAL  10/22/2015   IR US GUIDE VASC ACCESS RIGHT 10/22/2015 [facility]   IR GENERIC HISTORICAL  10/22/2015   IR FLUORO GUIDE CV LINE RIGHT 10/22/2015 [facility]   IR GENERIC HISTORICAL  10/23/2015   IR FLUORO GUIDE CV LINE RIGHT 10/23/2015 Marybelle Killings, MD [facility]   LEFT HEART CATHETERIZATION WITH CORONARY ANGIOGRAM N/A 07/13/2013   Procedure: LEFT HEART CATHETERIZATION WITH CORONARY ANGIOGRAM;  Surgeon: Jettie Booze, MD;  Location: [facility] C[facility] LAB;  Service: Cardiovascular;  Laterality: N/A;   LIGATION OF ARTERIOVENOUS  FISTULA Left 12/22/2012   Procedure: LIGATION OF ARTERIOVENOUS  FISTULA;EXCISION OF LARGE ANEURYSMS;;  Surgeon: Elam Dutch, MD;  Location: [place];  Service: Vascular;  Laterality: Left;   SPACE OAR INSTILLATION N/A 06/11/2021   Procedure: SPACE OAR INSTILLATION;  Surgeon: Janith Lima, MD;  [facility];  Service: Urology;  Laterality: N/A;   UPPER GASTROINTESTINAL ENDOSCOPY  09/07/2018   by dr Tarri Glenn    Assessment & Plan Clinical Impression: Patient is a 56 year old right-handed male with history of chronic anemia, nonalcoholic cirrhosis as well as ascites with paracentesis 2014-2015, COPD/OSA no longer on CPAP, end-stage renal disease with hemodialysis since 2009, hypertension, prostate cancer followed by Dr. Gloriann Loan, first-degree AV block, hypertension, hyperlipidemia, tobacco use.  Per chart review patient lives alone.  1 level home one-step to entry.  He takes SCAT to hemodialysis.  He has a sister and friends to take him to the grocery store when needed.  Presented 09/14/2021 with with left arm weakness as well as dysarthric speech 09/11/2021 and patient did not initially seek medical attention.  He was having a hard time holding his  utensils.  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  Remote lacunar infarct of the left thalamus.  CT angiogram head and neck negative for large vessel occlusion.  Bulky calcified plaque about the right carotid bulb/proximal right ICA with associated stenosis of up to 40%.  Admission chemistries unremarkable except BUN 23, creatinine 7.52, hemoglobin 6.8, alcohol negative.  Echocardiogram with ejection fraction of 55 to 60% no wall motion abnormality grade 3 diastolic dysfunction.  Patient did not receive tPA.  Neurology follow-up currently maintained on Plavix for CVA prophylaxis.  DAPT held due to chronic anemia as well as reported intolerance to aspirin.  Renal service follow-up with hemodialysis ongoing as directed.  Tolerating a regular consistency diet.  Patient transferred to CIR on 09/16/2021 .   Patient currently requires min with mobility secondary to muscle weakness, decreased cardiorespiratoy endurance, decreased coordination, decreased attention to left, decreased memory, and decreased sitting balance, decreased  standing balance, decreased postural control, hemiplegia, and decreased balance strategies.  Prior to hospitalization, patient was independent  with mobility and lived with Alone in a House home.  Home access is 1Stairs to enter.  Patient will benefit from skilled PT intervention to maximize safe functional mobility, minimize fall risk, and decrease caregiver burden for planned discharge home alone.  Anticipate patient will benefit from follow up OP at discharge.  PT - End of Session Activity Tolerance: Tolerates 30+ min activity with multiple rests Endurance Deficit: Yes Endurance Deficit Description: Pt requires multiple rest breaks with mobility PT Assessment Rehab Potential (ACUTE/IP ONLY): Good PT Barriers to Discharge: Decreased caregiver support PT Patient demonstrates  impairments in the following area(s): Balance;Behavior;Motor;Endurance;Perception;Safety PT Transfers Functional Problem(s): Bed Mobility;Bed to Chair;Furniture;Car PT Locomotion Functional Problem(s): Ambulation;Stairs PT Plan PT Intensity: Minimum of 1-2 x/day ,45 to 90 minutes PT Frequency: 5 out of 7 days PT Duration Estimated Length of Stay: 10-14 days PT Treatment/Interventions: Ambulation/gait training;Community reintegration;DME/adaptive equipment instruction;Neuromuscular re-education;Psychosocial support;Stair training;UE/LE Strength taining/ROM;Balance/vestibular training;Discharge planning;Functional electrical stimulation;Pain management;Skin care/wound management;UE/LE Coordination activities;Therapeutic Activities;Cognitive remediation/compensation;Disease management/prevention;Functional mobility training;Patient/family education;Splinting/orthotics;Therapeutic Exercise;Visual/perceptual remediation/compensation PT Transfers Anticipated Outcome(s): Mod(I) PT Locomotion Anticipated Outcome(s): Mod(I) PT Recommendation Follow Up Recommendations: Outpatient PT Patient destination: Home Equipment Recommended: To be determined   PT Evaluation Precautions/Restrictions Precautions Precautions: Fall Precaution Comments: L hemi; emotional lability; vision impairments (needs further testing to determine extent) Restrictions Weight Bearing Restrictions: No General Chart Reviewed: Yes Family/Caregiver Present: No Vital Signs Pain   Pain Interference Pain Interference Pain Effect on Sleep: 1. Rarely or not at all Pain Interference with Therapy Activities: 1. Rarely or not at all Pain Interference with Day-to-Day Activities: 1. Rarely or not at all Home Living/Prior [place] Available Help at Discharge: Family;Friend(s);Neighbor;Available PRN/intermittently Type of Home: House Home Access: Stairs to enter [employer] of Steps: 1 Entrance  Stairs-Rails: Right Home Layout: One level Bathroom Shower/Tub: Product/process development scientist: Standard Bathroom Accessibility: Yes Additional Comments: Pt has a SOC and quad-cane at home.  Lives With: Alone Prior Function Level of Independence: Independent with basic ADLs;Independent with homemaking with ambulation;Independent with gait  Able to Take Stairs?: Yes Driving: No Vocation: On disability Leisure: Hobbies-yes (Comment) (Likes to [employer]) Vision/Perception  Vision - History Ability to See in Adequate Light: 0 Adequate Vision - Assessment Additional Comments: decreased visual attentin to L side - needs further testing Perception Perception: Impaired Comments: decreased attention to the L Praxis Praxis: Not tested  Cognition Overall Cognitive Status: Impaired/Different from baseline Arousal/Alertness: Awake/alert Orientation Level: Oriented X4 Attention: Sustained Sustained Attention: Impaired Sustained Attention Impairment: Verbal complex;Functional complex Memory: Impaired Memory Impairment: Decreased short term memory Awareness: Appears intact Problem Solving: Impaired Problem Solving Impairment: Verbal complex;Functional complex Executive Function: Writer: Impaired Organizing Impairment: Functional basic Behaviors: Lability (verbose) Safety/Judgment: Appears intact Sensation Sensation Additional Comments: Not tested, however, pt reports no sensation deficits. Coordination Gross Motor Movements are Fluid and Coordinated: No Fine Motor Movements are Fluid and Coordinated: No Coordination and Movement Description: gross and fine motor deficits in LUE, RUE WFL 9 Hole Peg Test: TBD Motor  Motor Motor: Hemiplegia Motor - Skilled Clinical Observations: LUE hemi-plegia   Trunk/Postural Assessment  Cervical Assessment Cervical Assessment: Within Functional Limits Thoracic Assessment Thoracic Assessment: Within Functional Limits Lumbar  Assessment Lumbar Assessment: Within Functional Limits Postural Control Postural Control:  (delayed)  Balance Balance Balance Assessed: Yes (grossly assessed with ADLs - pt requires minimal assistance for standing balance) Standardized Balance Assessment Standardized Balance Assessment: Merrilee Jansky Balance  Test Berg Balance Test Sit to Stand: Able to stand without using hands and stabilize independently Standing Unsupported: Able to stand 2 minutes with supervision Sitting with Back Unsupported but Feet Supported on Floor or Stool: Able to sit safely and securely 2 minutes Stand to Sit: Sits independently, has uncontrolled descent Transfers: Needs one person to assist Standing Unsupported with Eyes Closed: Able to stand 10 seconds with supervision Standing Ubsupported with Feet Together: Able to place feet together independently and stand for 1 minute with supervision From Standing, Reach Forward with Outstretched Arm: Reaches forward but needs supervision From Standing Position, Pick up Object from Floor: Able to pick up shoe, needs supervision From Standing Position, Turn to Look Behind Over each Shoulder: Needs supervision when turning Turn 360 Degrees: Needs close supervision or verbal cueing Standing Unsupported, Alternately Place Feet on Step/Stool: Able to complete >2 steps/needs minimal assist Standing Unsupported, One Foot in Front: Loses balance while stepping or standing Standing on One Leg: Tries to lift leg/unable to hold 3 seconds but remains standing independently Total Score: 27 Static Sitting Balance Static Sitting - Balance Support: Feet unsupported Static Sitting - Level of Assistance: 5: Stand by assistance Dynamic Sitting Balance Dynamic Sitting - Balance Support: Feet unsupported Dynamic Sitting - Level of Assistance: 5: Stand by assistance Static Standing Balance Static Standing - Balance Support: Left upper extremity supported;During functional activity Static  Standing - Level of Assistance: 4: Min assist Dynamic Standing Balance Dynamic Standing - Balance Support: Left upper extremity supported Dynamic Standing - Level of Assistance: 4: Min assist Extremity Assessment  RUE Assessment RUE Assessment: Within Functional Limits LUE Assessment LUE Assessment: Exceptions to [facility] Passive Range of Motion (PROM) Comments: WFL Active Range of Motion (AROM) Comments: needs further testing - significantly impaired when completing functional ADLs General Strength Comments: needs further testing - 3-/5 shoulderflexion. Unable to complete due to time constraint. Pt demo's improved strength in proximal muscle groups compared to distal motor control. RLE Assessment RLE Assessment: Exceptions to [facility] General Strength Comments: Grossly 4+/5 LLE Assessment LLE Assessment: Exceptions to [facility] General Strength Comments: Grossly 3+/5  Care Tool Care Tool Bed Mobility Roll left and right activity   Roll left and right assist level: Minimal Assistance - Patient > 75%    Sit to lying activity   Sit to lying assist level: Minimal Assistance - Patient > 75%    Lying to sitting on side of bed activity   Lying to sitting on side of bed assist level: the ability to move from lying on the back to sitting on the side of the bed with no back support.: Minimal Assistance - Patient > 75%     Care Tool Transfers Sit to stand transfer   Sit to stand assist level: Minimal Assistance - Patient > 75%    Chair/bed transfer   Chair/bed transfer assist level: Minimal Assistance - Patient > 75%     Toilet transfer   Assist Level: Minimal Assistance - Patient > 75%    Car transfer   Car transfer assist level: Minimal Assistance - Patient > 75%      Care Tool Locomotion Ambulation   Assist level: Minimal Assistance - Patient > 75% Assistive device: No Device Max distance: 150'  Walk 10 feet activity   Assist level: Minimal Assistance - Patient > 75% Assistive device: No  Device   Walk 50 feet with 2 turns activity   Assist level: Minimal Assistance - Patient > 75% Assistive device: No Device  Walk 150 feet activity   Assist level: Minimal Assistance - Patient > 75% Assistive device: No Device  Walk 10 feet on uneven surfaces activity   Assist level: Minimal Assistance - Patient > 75% Assistive device:  (handrail)  Stairs   Assist level: Minimal Assistance - Patient > 75% Stairs assistive device: 2 hand rails Max number of stairs: 4  Walk up/down 1 step activity   Walk up/down 1 step (curb) assist level: Minimal Assistance - Patient > 75% Walk up/down 1 step or curb assistive device: 1 hand rail  Walk up/down 4 steps activity   Walk up/down 4 steps assist level: Minimal Assistance - Patient > 75% Walk up/down 4 steps assistive device: 1 hand rail  Walk up/down 12 steps activity Walk up/down 12 steps activity did not occur: Safety/medical concerns      Pick up small objects from floor   Pick up small object from the floor assist level: Contact Guard/Touching assist    Wheelchair Is the patient using a wheelchair?: No          Wheel 50 feet with 2 turns activity      Wheel 150 feet activity        Refer to Care Plan for Long Term Goals  SHORT TERM GOAL WEEK 1 PT Short Term Goal 1 (Week 1): Pt will perform bed mobility with CGA. PT Short Term Goal 2 (Week 1): Pt will perform bed to chair transfer with CGA. PT Short Term Goal 3 (Week 1): Pt will ambulate 150' with CGA and LRAD. PT Short Term Goal 4 (Week 1): Pt will improved Berg by MCID.  Recommendations for other services: None   Skilled Therapeutic Intervention  Evaluation completed (see details above and below) with education on PT POC and goals and individual treatment initiated with focus on balance, transfers, ambulation, and stair training. Pt received seated in recliner and agrees to therapy. No complaint of pain. Sit to stand and stand step transfer to [facility] with minA and cues for  posture and positioning. WC transport to gym. Pt performs car transfer and ramp navigation with minA and cues for sequencing. Pt takes seated rest break. Pt ambulates x150' without AD and with minA, with tactile and verbal cues for upright posture and increasing stride length and gait speed to decrease risk for falls. Initially, pt has a very slow, shuffling gait pattern, but is able to improve with cues. Pt completes x4 6" steps with bilateral hand rails and minA, with cues for step sequencing. Pt completes Berg balance test, and becomes visibly fatigued during test. Details above on score, which indicates pt is at high risk for falls. WC transport back to room. Stand step transfer to bed with CGA and cues for positioning. Left seated at edge of bed with alarm intact and all needs within reach.  Mobility Bed Mobility Bed Mobility: Left Sidelying to Sit Left Sidelying to Sit: Minimal Assistance - Patient >75% Transfers Transfers: Stand to Sit;Sit to Stand Sit to Stand: Minimal Assistance - Patient > 75% Stand to Sit: Minimal Assistance - Patient > 75% Transfer (Assistive device): None Locomotion  Gait Ambulation: Yes Gait Assistance: Minimal Assistance - Patient > 75% Gait Distance (Feet): 150 Feet Assistive device: None Gait Assistance Details: Verbal cues for sequencing;Verbal cues for gait pattern;Tactile cues for posture;Verbal cues for technique;Tactile cues for sequencing;Tactile cues for weight shifting Gait Gait: Yes Gait Pattern: Impaired Gait Pattern: Shuffle;Decreased trunk rotation Gait velocity: decreased Stairs / Additional Locomotion Stairs: Yes Stairs  Assistance: Minimal Assistance - Patient > 75% Stair Management Technique: Two rails Number of Stairs: 4 Height of Stairs: 6 Ramp: Minimal Assistance - Patient >75% Curb: Minimal Assistance - Patient >75% Wheelchair Mobility Wheelchair Mobility: No   Discharge Criteria: Patient will be discharged from PT if patient  refuses treatment 3 consecutive times without medical reason, if treatment goals not met, if there is a change in medical status, if patient makes no progress towards goals or if patient is discharged from hospital.  The above assessment, treatment plan, treatment alternatives and goals were discussed and mutually agreed upon: by patient  Breck Coons, PT, DPT 09/17/2021, 5:56 PM

## 2021-09-17 NOTE — Plan of Care (Signed)
  Problem: RH Expression Communication Goal: LTG Patient will increase speech intelligibility (SLP) Description: LTG: Patient will increase speech intelligibility at word/phrase/conversation level with cues, % of the time (SLP) Flowsheets (Taken 09/17/2021 1241) LTG: Patient will increase speech intelligibility (SLP): Independent Level: Conversation level   Problem: RH Problem Solving Goal: LTG Patient will demonstrate problem solving for (SLP) Description: LTG:  Patient will demonstrate problem solving for basic/complex daily situations with cues  (SLP) Flowsheets (Taken 09/17/2021 1241) LTG: Patient will demonstrate problem solving for (SLP): Complex daily situations LTG Patient will demonstrate problem solving for: Supervision   Problem: RH Memory Goal: LTG Patient will use memory compensatory aids to (SLP) Description: LTG:  Patient will use memory compensatory aids to recall biographical/new, daily complex information with cues (SLP) Flowsheets (Taken 09/17/2021 1241) LTG: Patient will use memory compensatory aids to (SLP): Modified Independent   Problem: RH Awareness Goal: LTG: Patient will demonstrate awareness during functional activites type of (SLP) Description: LTG: Patient will demonstrate awareness during functional activites type of (SLP) Flowsheets (Taken 09/17/2021 1241) Patient will demonstrate during cognitive/linguistic activities awareness type of:  Emergent  Anticipatory LTG: Patient will demonstrate awareness during cognitive/linguistic activities with assistance of (SLP): Supervision

## 2021-09-17 NOTE — Evaluation (Signed)
Occupational Therapy Assessment and Plan  Patient Details  Name: James Wall MRN: [id number] Date of Birth: 1965-10-27  OT Diagnosis: cognitive deficits, disturbance of vision, and hemiplegia affecting non-dominant side Rehab Potential: Rehab Potential (ACUTE ONLY): Good ELOS: 10 - 14 days   Today's Date: 09/17/2021 OT Individual Time: 1355-1500 OT Individual Time Calculation (min): 65 min     Hospital Problem: Principal Problem:   CVA (cerebral vascular accident) ([place]) Active Problems:   Right middle cerebral artery stroke ([place])   Past Medical History:  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 [facility])    primary cancer is prostate   Anemia associated with chronic renal failure    Anxiety    Arthritis    Cirrhosis, nonalcoholic ([place])    per pt possible from a medication , unsure ;   last ultrasound 08-09-2020 in epic no fibrosis   COPD (chronic obstructive pulmonary disease) ([place])    Depression    ESRD on hemodialysis ([place]) 2009   Started HD Jan 2009;  ESRD secondary to hypertensive nephrosclerosis;  dialysis at [facility] on MWF   First degree heart block    GERD (gastroesophageal reflux disease)    Hiatal hernia    History of acute respiratory failure 07/2013   admission;  HCAP w/ ARF with hypoxia   History of adenomatous polyp of colon    History of ascites    s/p paracentesis 01-31-2013 (5L) and last one 03-28-2013 (2.7L)   History of community acquired pneumonia 08/08/2020   admission ; LLL , POA   History of heart murmur in childhood    History of MRSA infection 12/19/2012   hospital admission;  w/ sepsis MRSA bacteramia AVF infection   History of sepsis 03/2017   admission;   HCAP w/ sepsis   Hyperlipidemia    Hypertension    Hypertensive heart disease    cardiologist--- dr Osborne Oman;  nuclear stress test 06-16-2013 intermediate risk w/ mid-distal anterior  wall ischemia , not gated;  cardiac cath 07-13-2013 in epic showed normal coronaries and LVSF,  ef not assessed, LCEDP 46mHg   Hypertensive nephrosclerosis, 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 ([place]    Intolerance to cold    due to anemia   Malignant neoplasm prostate ([facility]    urologist--- dr bell/  radiation onologist--- dr mTammi Klippel  dx 01/ 2023,  Gleason 4+3, PSA 368  NICM (nonischemic cardiomyopathy) ([place]    followed by cardiology;   last echo in epic 09-13-2020 ef 55-60%   OSA (obstructive sleep apnea) 2009   no  longer using cpap since the yr started 2009;   sleep study in epic 05-11-2007 severe osa   PSVT (paroxysmal supraventricular tachycardia) ([place]    event monitor 08-01-2019  SR w/ SVT runs , rare PAC/ PVC   Secondary hyperparathyroidism ([place]    Wears glasses    Past Surgical History:  Past Surgical History:  Procedure Laterality Date   AV FISTULA PLACEMENT Right 02/22/2013   Procedure:  CREATION  OF BRACHIAL CEPHALIC FISTULA RIGHT ARM;  Surgeon: CElam Dutch MD;  Location: [facility]  Service: Vascular;  Laterality: Right;   AV FISTULA PLACEMENT Left 08/10/2014   Procedure: BASILIC VEIN TRANSPOSITION  ARTERIOVENOUS (AV) FISTULA CREATION LEFT UPPER ARM;  Surgeon: JMal Misty MD;  Location: [facility]  Service: Vascular;  Laterality: Left;   AV FISTULA PLACEMENT, RADIOCEPHALIC Left 061/44/3154  @  [facility];  Left lower arm   COLONOSCOPY  11/30/2018   by dr Tarri Glenn   ESOPHAGOGASTRODUODENOSCOPY (EGD) WITH PROPOFOL N/A 04/12/2013   Procedure: ESOPHAGOGASTRODUODENOSCOPY (EGD) WITH PROPOFOL;  Surgeon: Arta Silence, MD;  Location: [facility];  Service: Endoscopy;  Laterality: N/A;   GOLD SEED IMPLANT N/A 06/11/2021   Procedure: GOLD SEED IMPLANT;  Surgeon: Janith Lima, MD;  Location: [facility] OR[facility];  Service: Urology;  Laterality: N/A;   INSERTION OF DIALYSIS CATHETER N/A 12/23/2012   Procedure: INSERTION OF DIALYSIS CATHETER; ULTRASOUND GUIDED;  Surgeon: Angelia Mould, MD;  [facility];  Service: Vascular;  Laterality: N/A;   INSERTION OF DIALYSIS CATHETER  10/22/2015   Right IJ non-tunneled HD catheter, placed again in 2019   IR FLUORO GUIDE CV LINE RIGHT  08/18/2019   IR FLUORO GUIDE CV LINE RIGHT  10/07/2019   IR GENERIC HISTORICAL  10/22/2015   IR US GUIDE VASC ACCESS RIGHT 10/22/2015 [facility]   IR GENERIC HISTORICAL  10/22/2015   IR FLUORO GUIDE CV LINE RIGHT 10/22/2015 [facility]   IR GENERIC HISTORICAL  10/23/2015   IR FLUORO GUIDE CV LINE RIGHT 10/23/2015 Marybelle Killings, MD [facility]   LEFT HEART CATHETERIZATION WITH CORONARY ANGIOGRAM N/A 07/13/2013   Procedure: LEFT HEART CATHETERIZATION WITH CORONARY ANGIOGRAM;  Surgeon: Jettie Booze, MD;  Location: [facility] CATH LAB;  Service: Cardiovascular;  Laterality: N/A;   LIGATION OF ARTERIOVENOUS  FISTULA Left 12/22/2012   Procedure: LIGATION OF ARTERIOVENOUS  FISTULA;EXCISION OF LARGE ANEURYSMS;;  Surgeon: Elam Dutch, MD;  Location: [place];  Service: Vascular;  Laterality: Left;   SPACE OAR INSTILLATION N/A 06/11/2021   Procedure: SPACE OAR INSTILLATION;  Surgeon: Janith Lima, MD;  [facility];  Service: Urology;  Laterality: N/A;   UPPER GASTROINTESTINAL ENDOSCOPY  09/07/2018   by dr Tarri Glenn    Assessment & Plan Clinical Impression: Patient is a 56 y.o. year old right-handed male with history of chronic anemia, nonalcoholic cirrhosis as well as ascites with paracentesis 2014-2015, COPD/OSA no longer on CPAP, end-stage renal disease with hemodialysis since 2009, hypertension, prostate cancer followed by Dr. Gloriann Loan, first-degree AV block, hypertension, hyperlipidemia, tobacco use.  Per chart review patient lives alone.  1 level home one-step to entry.  He takes SCAT to hemodialysis.  He has a sister and friends to take him to the grocery store when needed.  Presented 09/14/2021 with with left arm weakness as well as dysarthric speech 09/11/2021 and patient did not initially seek  medical attention.  He was having a hard time holding his utensils.  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  Remote lacunar infarct of the left thalamus.  CT angiogram head and neck negative for large vessel occlusion.  Bulky calcified plaque about the right carotid bulb/proximal right ICA with associated stenosis of up to 40%.  Admission chemistries unremarkable except BUN 23, creatinine 7.52, hemoglobin 6.8, alcohol negative.  Echocardiogram with ejection fraction of 55 to 60% no wall motion abnormality grade 3 diastolic dysfunction.  Patient did not receive tPA.  Neurology follow-up currently maintained on Plavix for CVA prophylaxis.  DAPT held due to chronic anemia as well as reported intolerance to aspirin.  Renal service follow-up with hemodialysis ongoing as directed.  Tolerating a regular consistency diet.   Patient transferred to CIR on 09/16/2021 .    Patient currently requires min - mod assist with basic self-care skills and  functional mobility secondary to impaired timing and sequencing, decreased coordination, and decreased motor planning, decreased visual perceptual skills, and decreased problem solving and decreased safety awareness.  Prior to hospitalization, patient could complete ADLs and functional mobility independently.   Patient will benefit from skilled intervention to decrease level of assist with basic self-care skills and increase independence with basic self-care skills prior to discharge home with care partner.  Anticipate patient will require 24 hour supervision and follow up home health.  OT - End of Session Activity Tolerance: Tolerates 30+ min activity with multiple rests Endurance Deficit: Yes Endurance Deficit Description: Pt demo's SOB on exertion with basic ADL tasks seated and standing. OT Assessment Rehab Potential (ACUTE ONLY): Good OT Barriers to Discharge:  Decreased caregiver support OT Barriers to Discharge Comments: Pt lives alone and reports only intermittent assistance is available after DC. OT Patient demonstrates impairments in the following area(s): Balance;Behavior;Perception;Cognition;Safety;Sensory;Endurance;Motor;Vision OT Basic ADL's Functional Problem(s): Grooming;Bathing;Dressing;Toileting;Eating OT Advanced ADL's Functional Problem(s): Simple Meal Preparation;Laundry;Light Housekeeping OT Transfers Functional Problem(s): Toilet;Tub/Shower OT Additional Impairment(s): Fuctional Use of Upper Extremity (LUE) OT Plan OT Intensity: Minimum of 1-2 x/day, 45 to 90 minutes OT Frequency: 5 out of 7 days OT Duration/Estimated Length of Stay: 10 - 14 days OT Treatment/Interventions: Balance/vestibular training;Discharge planning;Functional electrical stimulation;Self Care/advanced ADL retraining;Therapeutic Activities;UE/LE Coordination activities;Cognitive remediation/compensation;Functional mobility training;Patient/family education;Therapeutic Exercise;Community reintegration;Visual/perceptual remediation/compensation;DME/adaptive equipment instruction;Neuromuscular re-education;Psychosocial support;UE/LE Strength taining/ROM OT Self Feeding Anticipated Outcome(s): modified independence OT Basic Self-Care Anticipated Outcome(s): supervision OT Toileting Anticipated Outcome(s): supervision OT Bathroom Transfers Anticipated Outcome(s): supervision OT Recommendation Recommendations for Other Services: Neuropsych consult Patient destination: Home Follow Up Recommendations: Home health OT Equipment Recommended: To be determined;Tub/shower bench Equipment Details: OT will continue to assess equipment throughout admission.   OT Evaluation Precautions/Restrictions  Precautions Precautions: Fall Precaution Comments: L hemi; emotional lability; vision impairments (needs further testing to determine extent) Restrictions Weight Bearing  Restrictions: No General Chart Reviewed: Yes Family/Caregiver Present: No Vital Signs Please see "Flowsheet" for most recent vitals charted by nursing staff.  Pain Pain Assessment Pain Scale: 0-10 Pain Score: 0-No pain Home Living/Prior Functioning Home Living Family/patient expects to be discharged to:: Private residence Living Arrangements: Alone Available Help at Discharge: Family, Friend(s), Neighbor, Available PRN/intermittently Type of Home: House Home Access: Stairs to enter Technical brewer of Steps: 1 Entrance Stairs-Rails: Right Home Layout: One level Bathroom Shower/Tub: Tub/shower unit, Architectural technologist: Standard Bathroom Accessibility: Yes Additional Comments: Pt has a SOC and quad-cane at home.  Lives With: Alone IADL History Current License: Yes (However, does not have a car. Pt using transportation service to get to appointments.) Education: 12th grade Occupation: On disability Leisure and Hobbies: Pt enjoys cooking, sewing, and baking. Prior Function Level of Independence: Independent with basic ADLs, Independent with homemaking with ambulation, Independent with gait  Able to Take Stairs?: Yes Driving: No Vocation: On disability Leisure: Hobbies-yes (Comment) (Likes to [employer]) Vision Baseline Vision/History: 1 Wears glasses (Near sighted in R eye & far sighted L eye - differenty prescrptions in eyeglasses) Ability to See in Adequate Light: 0 Adequate Patient Visual Report: No change from baseline Vision Assessment?: Vision impaired- to be further tested in functional context Additional Comments: decreased visual attentin to L side - needs further testing Perception  Perception: Impaired Comments: decreased attention to the L Praxis Praxis: Not tested Cognition Cognition Overall Cognitive Status: Impaired/Different from baseline Arousal/Alertness: Awake/alert Memory: Impaired Memory Impairment: Decreased short term memory Attention:  Sustained Sustained Attention: Impaired Sustained Attention Impairment: Verbal complex;Functional complex Awareness: Appears  intact Problem Solving: Impaired Problem Solving Impairment: Verbal complex;Functional complex Executive Function: Writer: Impaired Organizing Impairment: Functional basic Behaviors: Lability (verbose) Safety/Judgment: Appears intact Brief Interview for Mental Status (BIMS) Repetition of Three Words (First Attempt): 3 Temporal Orientation: Year: Correct Temporal Orientation: Month: Accurate within 5 days Temporal Orientation: Day: Correct Recall: "Sock": Yes, no cue required Recall: "Blue": Yes, no cue required Recall: "Bed": Yes, after cueing ("a piece of furniture") BIMS Summary Score: 14 Sensation Sensation Additional Comments: Not tested, however, pt reports no sensation deficits. Coordination Gross Motor Movements are Fluid and Coordinated: No Fine Motor Movements are Fluid and Coordinated: No Coordination and Movement Description: gross and fine motor deficits in LUE, RUE WFL 9 Hole Peg Test: TBD Motor  Motor Motor: Hemiplegia Motor - Skilled Clinical Observations: LUE hemi-plegia  Trunk/Postural Assessment  Cervical Assessment Cervical Assessment: Within Functional Limits Thoracic Assessment Thoracic Assessment: Within Functional Limits Lumbar Assessment Lumbar Assessment: Within Functional Limits Postural Control Postural Control:  (delayed)  Balance Balance Balance Assessed: Yes (grossly assessed with ADLs - pt requires minimal assistance for standing balance) Standardized Balance Assessment Standardized Balance Assessment: (P) Berg Balance Test Berg Balance Test Sit to Stand: (P) Able to stand without using hands and stabilize independently Standing Unsupported: (P) Able to stand 2 minutes with supervision Sitting with Back Unsupported but Feet Supported on Floor or Stool: (P) Able to sit safely and securely 2  minutes Stand to Sit: (P) Sits independently, has uncontrolled descent Transfers: (P) Needs one person to assist Standing Unsupported with Eyes Closed: (P) Able to stand 10 seconds with supervision Standing Ubsupported with Feet Together: (P) Able to place feet together independently and stand for 1 minute with supervision From Standing, Reach Forward with Outstretched Arm: (P) Reaches forward but needs supervision From Standing Position, Pick up Object from Floor: (P) Able to pick up shoe, needs supervision From Standing Position, Turn to Look Behind Over each Shoulder: (P) Needs supervision when turning Turn 360 Degrees: (P) Needs close supervision or verbal cueing Standing Unsupported, One Foot in Front: (P) Loses balance while stepping or standing Static Sitting Balance Static Sitting - Balance Support: Feet unsupported Static Sitting - Level of Assistance: 5: Stand by assistance Dynamic Sitting Balance Dynamic Sitting - Balance Support: Feet unsupported Dynamic Sitting - Level of Assistance: 5: Stand by assistance Static Standing Balance Static Standing - Balance Support: Left upper extremity supported;During functional activity Static Standing - Level of Assistance: 4: Min assist Dynamic Standing Balance Dynamic Standing - Balance Support: Left upper extremity supported Dynamic Standing - Level of Assistance: 4: Min assist Extremity/Trunk Assessment RUE Assessment RUE Assessment: Within Functional Limits LUE Assessment LUE Assessment: Exceptions to [facility] Passive Range of Motion (PROM) Comments: WFL Active Range of Motion (AROM) Comments: needs further testing - significantly impaired when completing functional ADLs General Strength Comments: needs further testing - 3-/5 shoulderflexion. Unable to complete due to time constraint. Pt demo's improved strength in proximal muscle groups compared to distal motor control.  Care Tool Care Tool Self Care Eating   Eating Assist Level: Set  up assist    Oral Care    Oral Care Assist Level: Supervision/Verbal cueing    Bathing   Body parts bathed by patient: Left arm;Chest;Abdomen;Front perineal area;Right upper leg;Left upper leg;Face Body parts bathed by helper: Buttocks;Right arm;Right lower leg;Left lower leg   Assist Level: Moderate Assistance - Patient 50 - 74%    Upper Body Dressing(including orthotics)   What is the patient wearing?: Pull over shirt  Assist Level: Moderate Assistance - Patient 50 - 74%    Lower Body Dressing (excluding footwear)   What is the patient wearing?: Pants Assist for lower body dressing: Moderate Assistance - Patient 50 - 74%    Putting on/Taking off footwear   What is the patient wearing?: Shoes Assist for footwear: Maximal Assistance - Patient 25 - 49%       Care Tool Toileting Toileting activity   Assist for toileting: Moderate Assistance - Patient 50 - 74%     Care Tool Bed Mobility Roll left and right activity   Roll left and right assist level: Minimal Assistance - Patient > 75%    Sit to lying activity   Sit to lying assist level: Minimal Assistance - Patient > 75%    Lying to sitting on side of bed activity   Lying to sitting on side of bed assist level: the ability to move from lying on the back to sitting on the side of the bed with no back support.: Minimal Assistance - Patient > 75%     Care Tool Transfers Sit to stand transfer   Sit to stand assist level: Minimal Assistance - Patient > 75%    Chair/bed transfer   Chair/bed transfer assist level: Minimal Assistance - Patient > 75%     Toilet transfer   Assist Level: Minimal Assistance - Patient > 75%     Care Tool Cognition  Expression of Ideas and Wants Expression of Ideas and Wants: 3. Some difficulty - exhibits some difficulty with expressing needs and ideas (e.g, some words or finishing thoughts) or speech is not clear  Understanding Verbal and Non-Verbal Content Understanding Verbal and Non-Verbal  Content: 3. Usually understands - understands most conversations, but misses some part/intent of message. Requires cues at times to understand   Memory/Recall Ability Memory/Recall Ability : Current season;That he or she is in a hospital/hospital unit   Refer to Care Plan for [place] 1 OT Short Term Goal 1 (Week 1): Pt will complete grooming standing at the sink with CGA for safety. OT Short Term Goal 2 (Week 1): Pt will complete UB dressing with supervision using hemi-techniques while seated. OT Short Term Goal 3 (Week 1): Pt will complete LB dressing with CGA for safety using hemi-techniques. OT Short Term Goal 4 (Week 1): Pt will complete toileting and toilet transfer with CGA for safety with LRAD as needed. OT Short Term Goal 5 (Week 1): Pt will complete tub-shower transfer using a tub-bench with CGA for safety and LRAD as needed.  Recommendations for other services: Neuropsych   Skilled Therapeutic Intervention Pt awake in bed upon OT arrival to the room. Pt reports, "I'm not used to having to have help." Pt in agreement for OT session.  ADL Grooming: Supervision/safety (Pt able to complete oral care and wash face with supervision while seated in the w/c at the sink. Pt requires assistance to manage and apply toothpaste.) Where Assessed-Grooming: Sitting at sink;Wheelchair Upper Body Bathing:  (Pt reports completing prior to OT session.) Lower Body Bathing:  (Pt reports completing prior to OT session.) Upper Body Dressing: Moderate assistance (Education provided to pt on hemi-dressing techniques to improve independence with dressing. Pt requires assistance to thread LUE and make clothing adjustments around trunk.) Where Assessed-Upper Body Dressing: Edge of bed Lower Body Dressing: Moderate assistance (Education provided to pt on hemi-dressing techniques to improve independence with dressing. Pt requires assistance to fully thread B feet through  pants and  fully pull up over waist on L side.) Where Assessed-Lower Body Dressing: Edge of bed (& standing to pull clothing up) Toileting:  (Pt declines need at this time. However, simulated for CARE score.) Toilet Transfer: Minimal assistance (Pt able to complete ambulatory transfer from w/c > toilet with minimal assistance and HHA on LUE. Pt demo's a step-to pattern and takes small steps during transfer.) Toilet Transfer Method: Counselling psychologist: Grab bars ADL Comments: Education provided to pt on hemi-dressing techniques with UB/LB dressing in order to improve independence with task. Pt able to perform supine > sit transfer with L side exit from bed with close supervision for safety. Pt able to perform sit <> stand from bed level with minimal assistance for force production. Pt able to complete various ambulatory transfers from EOB > w/c > toilet > recliner with minimal assistance with HHA and support on L side. Pt presents with 1 episode of emotional lability when initially standing. Education provided to pt on how location of stroke can impact emotional regulation or cause different personality presentations. Pt verbalizes understanding. Mobility  Bed Mobility Bed Mobility: Left Sidelying to Sit Left Sidelying to Sit: Minimal Assistance - Patient >75% Transfers Sit to Stand: Minimal Assistance - Patient > 75% Stand to Sit: Minimal Assistance - Patient > 75%  Pt requested to stay in the recliner at end of session. Pt left sitting comfortably in the recliner with personal belongings and call light within reach, belt alarm placed and activated, and comfort needs attended to.    Discharge Criteria: Patient will be discharged from OT if patient refuses treatment 3 consecutive times without medical reason, if treatment goals not met, if there is a change in medical status, if patient makes no progress towards goals or if patient is discharged from hospital.  The above assessment,  treatment plan, treatment alternatives and goals were discussed and mutually agreed upon: by patient  Barbee Shropshire 09/17/2021, 5:18 PM

## 2021-09-18 DIAGNOSIS — I639 Cerebral infarction, unspecified: Secondary | ICD-10-CM | POA: Diagnosis not present

## 2021-09-18 LAB — RENAL FUNCTION PANEL
Albumin: 3.3 g/dL — ABNORMAL LOW (ref 3.5–5.0)
Anion gap: 12 (ref 5–15)
BUN: 29 mg/dL — ABNORMAL HIGH (ref 6–20)
CO2: 25 mmol/L (ref 22–32)
Calcium: 10.4 mg/dL — ABNORMAL HIGH (ref 8.9–10.3)
Chloride: 97 mmol/L — ABNORMAL LOW (ref 98–111)
Creatinine, Ser: 9.7 mg/dL — ABNORMAL HIGH (ref 0.61–1.24)
GFR, Estimated: 6 mL/min — ABNORMAL LOW (ref >60)
Glucose, Bld: 98 mg/dL (ref 70–99)
Phosphorus: 5 mg/dL — ABNORMAL HIGH (ref 2.5–4.6)
Potassium: 4.2 mmol/L (ref 3.5–5.1)
Sodium: 134 mmol/L — ABNORMAL LOW (ref 135–145)

## 2021-09-18 LAB — TYPE AND SCREEN
ABO/RH(D): O POS
Antibody Screen: NEGATIVE
Unit division: 0
Unit division: 0

## 2021-09-18 LAB — CBC
HCT: 23.7 % — ABNORMAL LOW (ref 39.0–52.0)
Hemoglobin: 8.2 g/dL — ABNORMAL LOW (ref 13.0–17.0)
MCH: 34.5 pg — ABNORMAL HIGH (ref 26.0–34.0)
MCHC: 34.6 g/dL (ref 30.0–36.0)
MCV: 99.6 fL (ref 80.0–100.0)
Platelets: 120 10*3/uL — ABNORMAL LOW (ref 150–400)
RBC: 2.38 MIL/uL — ABNORMAL LOW (ref 4.22–5.81)
RDW: 14.7 % (ref 11.5–15.5)
WBC: 3.2 10*3/uL — ABNORMAL LOW (ref 4.0–10.5)
nRBC: 0 % (ref 0.0–0.2)

## 2021-09-18 LAB — BPAM RBC
Blood Product Expiration Date: 202309112359
Blood Product Expiration Date: 202309112359
Unit Type and Rh: 5100
Unit Type and Rh: 5100

## 2021-09-18 MED ORDER — ALTEPLASE 2 MG IJ SOLR: 2.0000 mg | Freq: Once | INTRAMUSCULAR | Status: DC | PRN

## 2021-09-18 MED ORDER — ANTICOAGULANT SODIUM CITRATE 4% (200MG/5ML) IV SOLN
5.0000 mL | Status: DC | PRN
  Filled 2021-09-18: qty 5

## 2021-09-18 MED ORDER — CHLORHEXIDINE GLUCONATE CLOTH 2 % EX PADS
6.0000 | MEDICATED_PAD | Freq: Two times a day (BID) | CUTANEOUS | Status: DC
  Administered 2021-09-18 – 2021-10-01 (×23): 6 via TOPICAL

## 2021-09-18 MED ORDER — HEPARIN SODIUM (PORCINE) 1000 UNIT/ML DIALYSIS
1000.0000 [IU] | INTRAMUSCULAR | Status: DC | PRN
  Administered 2021-09-18: 3200 [IU]
  Filled 2021-09-18 (×2): qty 1

## 2021-09-18 MED ORDER — CHLORHEXIDINE GLUCONATE CLOTH 2 % EX PADS: 6.0000 | MEDICATED_PAD | Freq: Every day | CUTANEOUS | Status: DC

## 2021-09-18 NOTE — Progress Notes (Signed)
Pt receives out-pt HD at [facility] on MWF. Contacted clinic today to advise them of pt's d/c date from rehab planned for 8/29 and pt will resume care on 8/30. Will assist as needed.   Melven Sartorius Renal Navigator [phone]

## 2021-09-18 NOTE — Progress Notes (Signed)
Physical Therapy Session Note  Patient Details  Name: James Wall MRN: [id number] Date of Birth: Nov 29, 1965  Today's Date: 09/18/2021  PT Individual Time: 0800-0900 and 1034-1100 PT Individual Time Calculation (min): 60 min and 26 min   Short Term Goals: Week 1:  PT Short Term Goal 1 (Week 1): Pt will perform bed mobility with CGA. PT Short Term Goal 2 (Week 1): Pt will perform bed to chair transfer with CGA. PT Short Term Goal 3 (Week 1): Pt will ambulate 150' with CGA and LRAD. PT Short Term Goal 4 (Week 1): Pt will improved Berg by MCID.  Skilled Therapeutic Interventions/Progress Updates:  Session 1.   Pt received supine in bed and agreeable to PT. Supine>sit transfer with supervision assist and cues for use of the LUE to push to into sitting from sidelying.   Sitting balance EOB for IV team to draw blood and to con shoes with supervision assist. Total A for PT to tie shoes.   Stand pivot transfer to [facility] with CGA and no AD. Transported to rehab gym in [facility]. Gait training with no AD 2 x 54f with min-CGA from PT. Additional gait training with SPC and CGA for safety. Noted to have improved step length postural control and glute med activation with SPC vs no AD.  Standing balance/pregait training with BUE supported on parallel bars 2 x 8 and x 5 with no UE support. Tactile cues for improved glute med activation.  Standing hip abduction x 8 BLE with BUE supported on rails. Cues for posture and to prevent compensation through TFL.   Seated hip abduction with red tband 2 x 8 with 3 sec hold. Cues for attention to the LLE and full ROM to improve glute med activation.   Patient returned to room and performed stand pivot to recliner with no AD and CGA. Pt left sitting in recliner with call bell in reach and all needs met.      Session 2.  Pt received sitting in recliner and agreeable to PT. Pt performed stand pivot transfer with no AD and CGA from PT for safety.   Pt transported to  orthogym. Gait training with RW x 1059fwith CGA and cues for attention to the LUE to maintain grasp on L handle throughout. Pt reports feeling more comfortable with SPC vs RW. Additional gait training with SPC x 2550fith CGA and cues for step length on the LLE initially.   Standing [facility] ground to perform visual scanning user paced x 1 min with the RUE and x 1 min with the LUE. Max assist for ROM and digit control on the LUE.   Patient returned to room and performed stand pivot to recliner with CGA for safety. Pt left sitting in recliner with call bell in reach and all needs met.        Therapy Documentation Precautions:  Precautions Precautions: Fall Precaution Comments: L hemi; emotional lability; vision impairments (needs further testing to determine extent) Restrictions Weight Bearing Restrictions: No  Pain: Session 1  Pain Assessment Pain Scale: 0-10 Pain Score: 0-No pain Session 2 Pain Assessment Pain Scale: 0-10 Pain Score: 0-No pain   Therapy/Group: Individual Therapy  AusLorie Phenix16/2023, 9:04 AM

## 2021-09-18 NOTE — IPOC Note (Signed)
Overall Plan of Care [facility]) Patient Details Name: James Wall MRN: [id number] DOB: [birth year]  Admitting Diagnosis: CVA (cerebral vascular accident) [facility])  Hospital Problems: Principal Problem:   CVA (cerebral vascular accident) [facility] Active Problems:   Right middle cerebral artery stroke ([place])     Functional Problem List: Nursing Bladder, Endurance, Safety, Medication Management  PT Balance, Behavior, Motor, Endurance, Perception, Safety  OT Balance, Behavior, Perception, Cognition, Safety, Sensory, Endurance, Motor, Vision  SLP Cognition, Motor  TR         Basic ADL's: OT Grooming, Bathing, Dressing, Toileting, Eating     Advanced  ADL's: OT Simple Meal Preparation, Laundry, Light Housekeeping     Transfers: PT Bed Mobility, Bed to Chair, Furniture, Teacher, early years/pre, Metallurgist: PT Ambulation, Stairs     Additional Impairments: OT Fuctional Use of Upper Extremity (LUE)  SLP Social Cognition, Communication expression Problem Solving, Memory, Attention  TR      Anticipated Outcomes Item Anticipated Outcome  Self Feeding modified independence  Swallowing  NA   Basic self-care  supervision  Toileting  supervision   Bathroom Transfers supervision  Bowel/Bladder  manage bladder w mod I assist  Transfers  Mod(I)  Locomotion  Mod(I)  Communication  mod I  Cognition  sup A  Pain  n/a  Safety/Judgment  manage safety w cues   Therapy Plan: PT Intensity: Minimum of 1-2 x/day ,45 to 90 minutes PT Frequency: 5 out of 7 days PT Duration Estimated Length of Stay: 10-14 days OT Intensity: Minimum of 1-2 x/day, 45 to 90 minutes OT Frequency: 5 out of 7 days OT Duration/Estimated Length of Stay: 10 - 14 days SLP Intensity: Minumum of 1-2 x/day, 30 to 90 minutes SLP Frequency: 3 to 5 out of 7 days SLP Duration/Estimated Length of Stay: 10-14 days   Team Interventions: Nursing Interventions Bladder Management, Disease  Management/Prevention, Medication Management, Discharge Planning, Patient/Family Education  PT interventions Ambulation/gait training, Community reintegration, DME/adaptive equipment instruction, Neuromuscular re-education, Psychosocial support, Stair training, UE/LE Strength taining/ROM, Training and development officer, Discharge planning, Functional electrical stimulation, Pain management, Skin care/wound management, UE/LE Coordination activities, Therapeutic Activities, Cognitive remediation/compensation, Disease management/prevention, Functional mobility training, Patient/family education, Splinting/orthotics, Therapeutic Exercise, Visual/perceptual remediation/compensation  OT Interventions Balance/vestibular training, Discharge planning, Functional electrical stimulation, Self Care/advanced ADL retraining, Therapeutic Activities, UE/LE Coordination activities, Cognitive remediation/compensation, Functional mobility training, Patient/family education, Therapeutic Exercise, Community reintegration, Visual/perceptual remediation/compensation, DME/adaptive equipment instruction, Neuromuscular re-education, Psychosocial support, UE/LE Strength taining/ROM  SLP Interventions Cognitive remediation/compensation, Functional tasks, Patient/family education, Speech/Language facilitation, Therapeutic Activities  TR Interventions    SW/CM Interventions Discharge Planning, Psychosocial Support, Patient/Family Education, Disease Management/Prevention   Barriers to Discharge MD  Medical stability and Hemodialysis  Nursing Decreased caregiver support, Home environment access/layout, Hemodialysis 1 level 1ste solo; sister and friend to assist prn  PT Decreased caregiver support    OT Decreased caregiver support Pt lives alone and reports only intermittent assistance is available after DC.  SLP      SW Hemodialysis, Lack of/limited family support, Decreased caregiver support     Team Discharge  Planning: Destination: PT-Home ,OT- Home , SLP-Home Projected Follow-up: PT-Outpatient PT, OT-  Home health OT, SLP-Outpatient SLP, Home Health SLP Projected Equipment Needs: PT-To be determined, OT- To be determined, Tub/shower bench, SLP-None recommended by SLP Equipment Details: PT- , OT-OT will continue to assess equipment throughout admission. Patient/family involved in discharge planning: PT- Patient,  OT-Patient, SLP-Patient  MD ELOS: 10-14d Medical Rehab Prognosis:  Good Assessment:  The patient has been admitted for CIR therapies with the diagnosis of Right CVA. The team will be addressing functional mobility, strength, stamina, balance, safety, adaptive techniques and equipment, self-care, bowel and bladder mgt, patient and caregiver education, difficult to control HTN in setting of HD. Goals have been set at Mod I/Sup. Anticipated discharge destination is home .     See Team Conference Notes for weekly updates to the plan of care

## 2021-09-18 NOTE — Progress Notes (Addendum)
PROGRESS NOTE   Subjective/Complaints:  Pt worked on stairs and car transfers with PT yesterday , would like to try cooking in OT kitchen   ROS- neg N/V/D no CP or SOB  Objective:   No results found. Recent Labs    09/16/21 0750 09/17/21 0545  WBC 3.4* 3.2*  HGB 7.7* 7.0*  HCT 22.8* 20.2*  PLT 151 138*    Recent Labs    09/17/21 0545  NA 136  K 3.9  CL 99  CO2 27  GLUCOSE 87  BUN 18  CREATININE 7.11*  CALCIUM 9.8     Intake/Output Summary (Last 24 hours) at 09/18/2021 0748 Last data filed at 09/18/2021 3893 Gross per 24 hour  Intake 351 ml  Output --  Net 351 ml        Physical Exam: Vital Signs Blood pressure (!) 173/84, pulse 60, temperature 98.1 F (36.7 C), temperature source Oral, resp. rate 20, height 5' 5"  (1.651 m), weight 85.1 kg, SpO2 95 %.   General: No acute distress Mood and affect are appropriate Heart: Regular rate and rhythm no rubs murmurs or extra sounds Lungs: Clear to auscultation, breathing unlabored, no rales or wheezes Abdomen: Positive bowel sounds, soft nontender to palpation, nondistended Extremities: No clubbing, cyanosis, or edema Skin: No evidence of breakdown, no evidence of rash, HD cath site without erythema or fluctuance  Neurologic: Cranial nerves II through XII intact, motor strength is 5/5 in right  deltoid, bicep, tricep, grip, hip flexor, knee extensors, ankle dorsiflexor and plantar flexor 3- LUE, 2- LLE Sensory exam normal sensation to light touch and proprioception in bilateral upper and lower extremities  Musculoskeletal: Full range of motion in all 4 extremities. No joint swelling   Assessment/Plan: 1. Functional deficits which require 3+ hours per day of interdisciplinary therapy in a comprehensive inpatient rehab setting. Physiatrist is providing close team supervision and 24 hour management of active medical problems listed below. Physiatrist and  rehab team continue to assess barriers to discharge/monitor patient progress toward functional and medical goals  Care Tool:  Bathing    Body parts bathed by patient: Left arm, Chest, Abdomen, Front perineal area, Right upper leg, Left upper leg, Face   Body parts bathed by helper: Buttocks, Right arm, Right lower leg, Left lower leg     Bathing assist Assist Level: Moderate Assistance - Patient 50 - 74%     Upper Body Dressing/Undressing Upper body dressing   What is the patient wearing?: Pull over shirt    Upper body assist Assist Level: Moderate Assistance - Patient 50 - 74%    Lower Body Dressing/Undressing Lower body dressing      What is the patient wearing?: Pants     Lower body assist Assist for lower body dressing: Moderate Assistance - Patient 50 - 74%     Toileting Toileting Toileting Activity did not occur (Clothing management and hygiene only): N/A (no void or bm)  Toileting assist Assist for toileting: Moderate Assistance - Patient 50 - 74%     Transfers Chair/bed transfer  Transfers assist  Chair/bed transfer activity did not occur: Safety/medical concerns  Chair/bed transfer assist level: Minimal Assistance - Patient >  75%     Locomotion Ambulation   Ambulation assist      Assist level: Minimal Assistance - Patient > 75% Assistive device: No Device Max distance: 150'   Walk 10 feet activity   Assist     Assist level: Minimal Assistance - Patient > 75% Assistive device: No Device   Walk 50 feet activity   Assist    Assist level: Minimal Assistance - Patient > 75% Assistive device: No Device    Walk 150 feet activity   Assist    Assist level: Minimal Assistance - Patient > 75% Assistive device: No Device    Walk 10 feet on uneven surface  activity   Assist     Assist level: Minimal Assistance - Patient > 75% Assistive device:  (handrail)   Wheelchair     Assist Is the patient using a wheelchair?: No              Wheelchair 50 feet with 2 turns activity    Assist            Wheelchair 150 feet activity     Assist          Blood pressure (!) 173/84, pulse 60, temperature 98.1 F (36.7 C), temperature source Oral, resp. rate 20, height 5' 5"  (1.651 m), weight 85.1 kg, SpO2 95 %.  Medical Problem List and Plan: 1. 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  Recommendations of 30-day monitor             -patient may shower             -ELOS/Goals: 14 days, supervision goals 2.  Antithrombotics: -DVT/anticoagulation:  Mechanical: Antiembolism stockings, thigh (TED hose) Bilateral lower extremities             -antiplatelet therapy: Plavix 75 mg daily only.  DAPT held due to chronic anemia 3. Pain Management: N/A 4. Mood/Behavior/Sleep: Provide emotional support             -antipsychotic agents: N/A 5. Neuropsych/cognition: This patient is capable of making decisions on his own behalf. 6. Skin/Wound Care: Routine skin checks 7. Fluids/Electrolytes/Nutrition: Routine in and outs with follow-up chemistries 8.  Hypertension.  Norvasc 10 mg daily, clonidine patch 0.3 mg weekly, hydralazine 25 mg twice daily, Isordil 20 mg daily, Lopressor 100 mg twice daily.   Vitals:   09/17/21 2001 09/18/21 0430  BP: (!) 171/84 (!) 173/84  Pulse: 61 60  Resp: 18 20  Temp: 98.5 F (36.9 C) 98.1 F (36.7 C)  SpO2: 99% 95%  ELevated BP- permissive for now but may need to increase hydralazine   9.  End-stage renal disease.  Hemodialysis as directed per renal services             -HD at end of day to allow participation in therapies 10.  Hyperlipidemia.  Crestor 11.  Nonalcoholic cirrhosis with ascites.  Paracentesis 2014-2015. 12.  History of prostate cancer.  Followed outpatient by Dr. Gloriann Loan and recently completed radiation therapy.  Continue Flomax 13.  History of first-degree AV block.  No chest pain or shortness of breath 14.   Tobacco abuse.  Counseling 15.  COPD.  OSA.  Patient no longer on CPAP. 16.  Chronic anemia.  Plan IV iron weekly with ferric per hemodialysis, transfuse if < 7.0 , consider  if pt becomes orthostatic or tachy in therapy   17.  Hx of GERD, took a tablet at home for this  will start PPI 18.  Mild thrombocytopenia, no bleeding monitor plt count no sq heparin for DVT prophyllaxis   LOS: 2 days A FACE TO FACE EVALUATION WAS PERFORMED  Charlett Blake 09/18/2021, 7:48 AM

## 2021-09-18 NOTE — Progress Notes (Signed)
Speech Language Pathology Daily Session Note  Patient Details  Name: Fields Oros MRN: [id number] Date of Birth: Aug 10, 1965  Today's Date: 09/18/2021 SLP Individual Time: 0903-1000 SLP Individual Time Calculation (min): 57 min  Short Term Goals: Week 1: SLP Short Term Goal 1 (Week 1): Pt will utilize speech intelligiblity strategies at the conversational level with mod I SLP Short Term Goal 2 (Week 1): Patient will complete mildly complex problem solving tasks with min-to-mod A verbal/visual cues SLP Short Term Goal 3 (Week 1): Patient will utilize external memory strategies to recall novel information with min A SLP Short Term Goal 4 (Week 1): Patient will demonstrate awareness to errors during functional cognitive tasks with min A  Skilled Therapeutic Interventions: Skilled ST treatment focused on cognitive goals. SLP facilitated session by providing education on strategies for living a brain healthy lifestyle and making informed decisions to improve overall health and wellness using DANCERS acronym (i.e., disease management, activity, nutrition, cognitive stimulation, engagement, relaxation, sleep). Pt participated in functional discussion by commenting on both strengths and areas for improvement within these areas. Pt benefited from min A verbal redirection throughout session for topic maintenance due to hyperverbal nature. Pt was also known to repeat same stories as discussed yesterday without awareness, however acknowledge there may be decreased awareness of this d/t being in a new/unfamiliar setting and interacting with many new people. Patient was left in recliner with alarm activated and immediate needs within reach at end of session. Continue per current plan of care.      Pain Pain Assessment Pain Scale: 0-10 Pain Score: 0-No pain  Therapy/Group: Individual Therapy  Patty Sermons 09/18/2021, 10:34 AM

## 2021-09-18 NOTE — Progress Notes (Signed)
   [facility] Progress Note    Assessment/ Plan:   Subacute CVA -symptom onset 8/11, out of window for intervention. 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 Tolerating treatment. No complaints. He is happy about doing PT/OT. UFG 3.5L.   Objective:   BP 119/76 (BP Location: Right Arm)   Pulse (!) 47   Temp 97.8 F (36.6 C) (Oral)   Resp 12   Ht 5' 5"  (1.651 m)   Wt 85.1 kg   SpO2 99%   BMI 31.22 kg/m   Intake/Output Summary (Last 24 hours) at 09/18/2021 1441 Last data filed at 09/18/2021 03646Gross per 24 hour  Intake 234 ml  Output --  Net 234 ml   Weight change:   Physical Exam: Gen:NAD CVS:RRR Resp:normal WOB, CTA BL AOEH:OZYYExt: no sig edema b/l LEs Neuro: left sided weakness noted, awake/alert [facility]: No results found.  Labs: BMET Recent Labs  Lab 09/14/21 1323 09/15/21 0329 09/17/21 0545 09/18/21 0808  NA 139 138 136 134*  K 3.6 4.0 3.9 4.2  CL 102 101 99 97*  CO2 29 26 27 25   GLUCOSE 95 95 87 98  BUN 23* 25* 18 29*  CREATININE 7.52* 8.86* 7.11* 9.70*  CALCIUM 10.1 9.8 9.8 10.4*  PHOS  --   --   --  5.0*   CBC Recent Labs  Lab 09/14/21 1323 09/15/21 0330 09/16/21 0750 09/17/21 0545 09/18/21 0808  WBC 3.0* 2.7* 3.4* 3.2* 3.2*  NEUTROABS 2.0 1.8  --  2.4  --   HGB 6.8* 6.8* 7.7* 7.0* 8.2*  HCT  20.0* 21.3* 22.8* 20.2* 23.7*  MCV 102.6* 105.4* 102.2* 98.1 99.6  PLT 137* 135* 151 138* 120*    Medications:     amLODipine  10 mg Oral QHS   Chlorhexidine Gluconate Cloth  6 each Topical Q12H   [START ON 09/22/2021] cloNIDine  0.3 mg Transdermal Q Sun   clopidogrel  75 mg Oral Daily   hydrALAZINE  25 mg Oral BID   isosorbide dinitrate  20 mg Oral BID   metoprolol tartrate  100 mg Oral BID   pantoprazole  20 mg Oral Daily   rosuvastatin  10 mg Oral Daily   sevelamer carbonate  3,200 mg Oral TID WC   tamsulosin  0.4 mg Oral QPC supper   umeclidinium-vilanterol  1 puff Inhalation Daily      VGean Quint MD [facility] 09/18/2021, 2:41 PM

## 2021-09-18 NOTE — Progress Notes (Addendum)
Patient ID: Alison Breeding, male   DOB: 12/02/1965, 56 y.o.   MRN: [id number]  This SW covering for primary SW, Erlene Quan.   SW met with pt in room to provide updates from team conference, and d/c date 8/29. He confirms intermittent support at discharge from sister, friend, and a neighbor. He continues to report he uses [facility] [place] for transportation to/from dialysis.   SW updated [employer] on his d/c date.   Loralee Pacas, MSW, [place] Office: [phone] Cell: [phone] Fax: [phone]

## 2021-09-18 NOTE — Progress Notes (Signed)
Received patient in bed to unit. Alert and oriented. Informed consent signed and in  chart.   Treatment initiated: 1359 Treatment completed: 1745  Patient tolerated well. Treatment ended approx. 69mns before scheduled time d/t bicarb running low. Transported back to the room alert, without acute distress. Report to patient's floor nurse.   Access used: RIJ  Access issues: 19323 Total UF removed: 3L Medication(s) given: Ferric Gluconate  Post HD VS: 187/83 48 100% 18 97.8 Post HD weight: 79.6 kg    [facility] Dialysis [facility]

## 2021-09-18 NOTE — Progress Notes (Signed)
Occupational Therapy Session Note  Patient Details  Name: James Wall MRN: [id number] Date of Birth: [birth year]  Today's Date: 09/18/2021 OT Individual Time: 1100-1200 OT Individual Time Calculation (min): 60 min    Short Term Goals: Week 1:  OT Short Term Goal 1 (Week 1): Pt will complete grooming standing at the sink with CGA for safety. OT Short Term Goal 2 (Week 1): Pt will complete UB dressing with supervision using hemi-techniques while seated. OT Short Term Goal 3 (Week 1): Pt will complete LB dressing with CGA for safety using hemi-techniques. OT Short Term Goal 4 (Week 1): Pt will complete toileting and toilet transfer with CGA for safety with LRAD as needed. OT Short Term Goal 5 (Week 1): Pt will complete tub-shower transfer using a tub-bench with CGA for safety and LRAD as needed.  Skilled Therapeutic Interventions/Progress Updates:    Pt received in recliner, ready for therapy.  Worked on sit to stand from recliner with min cues for forward wt shift.  CGA to stand and use cane to step to w/c. Pt taken to gym to focus on LUE NMR.   Pt used cane to transfer to sitting at EOM.   A/arom using towel table slides. Pt has active sh flex and ext moving arm forward and back gravity eliminated with 30 degrees of ROM.  Slight elbow flex on 30 degrees.  He does have about 10% of finger extension and slight grasp.   Tried estim to L forearm for finger wrist extension but pt unable to tolerate.  I cued him to tell me when he felt it and then I would increase it until I could see a muscle contraction, but after light vibration pt unable to try anything stronger.  Did not even increase it enough to see a muscle contraction.    Hand over hand guiding picking up and releasing cones with cross body reach.  Pt able to lightly hold cones.  Gave pt homework of grasping handle of his cane and pushing it back and forth for sh flexion which he was able to do to 30 degrees. Pt given green foam block  for hand squeezes.     Pt taken back to room and transferred to bed with CGA.   Bed alarm set.   Therapy Documentation Precautions:  Precautions Precautions: Fall Precaution Comments: L hemi; emotional lability; vision impairments (needs further testing to determine extent) Restrictions Weight Bearing Restrictions: No    Vital Signs: Therapy Vitals Temp: 98.1 F (36.7 C) Temp Source: Oral Pulse Rate: 60 Resp: 20 BP: (!) 173/84 Patient Position (if appropriate): Lying Oxygen Therapy SpO2: 95 % O2 Device: Room Air Pain: Pain Assessment Pain Scale: 0-10 Pain Score: 0-No pain      Therapy/Group: Individual Therapy  [place] 09/18/2021, 8:25 AM

## 2021-09-19 ENCOUNTER — Telehealth: Payer: Self-pay | Admitting: Internal Medicine

## 2021-09-19 DIAGNOSIS — I639 Cerebral infarction, unspecified: Secondary | ICD-10-CM | POA: Diagnosis not present

## 2021-09-19 NOTE — Progress Notes (Signed)
Nurse gave patient Renvela @ around 1800. Patient took the pills and nurse left the room. However, NT reports that patient left 1 of the Renvela's on the tray. Patient not receptive to education. Will pass on to incoming nurse.     Gladstone Lighter, LPN

## 2021-09-19 NOTE — Progress Notes (Signed)
Occupational Therapy Session Note  Patient Details  Name: James Wall MRN: [id number] Date of Birth: 1966/01/30  Today's Date: 09/19/2021 OT Individual Time: 2446-2863 OT Individual Time Calculation (min): 60 min    Short Term Goals: Week 1:  OT Short Term Goal 1 (Week 1): Pt will complete grooming standing at the sink with CGA for safety. OT Short Term Goal 2 (Week 1): Pt will complete UB dressing with supervision using hemi-techniques while seated. OT Short Term Goal 3 (Week 1): Pt will complete LB dressing with CGA for safety using hemi-techniques. OT Short Term Goal 4 (Week 1): Pt will complete toileting and toilet transfer with CGA for safety with LRAD as needed. OT Short Term Goal 5 (Week 1): Pt will complete tub-shower transfer using a tub-bench with CGA for safety and LRAD as needed.  Skilled Therapeutic Interventions/Progress Updates:    Patient agreeable to participate in OT session. Reports 0/10 pain level.   Patient participated in skilled OT session focusing on ADL re-training and NM re-education to the LUE. Therapist facilitated session with patient completing LB dressing while seated on EOB to change pants while utilizing hemi-dressing techniques. OT education patient on use of adaptive technique such as elastic shoelaces to increase functional performance during LB dressing. Pt provided with Min assist while standing for balance while pulling pants up over hips and OT tied PJ pants while patient stood with supervision.  While seated on EOB, pt completed LUE A/ROM with UE resting hand towel and tray table focusing shoulder flexion/extension and horizontal abduction/adduction. 5X each. Improved A/ROM noted as patient was able to decrease shoulder abduction when therapist provided VC to decrease compensatory movement and provided muscle tapping to facilitate shoulder flexion.    Therapy Documentation Precautions:  Precautions Precautions: Fall Precaution Comments: L  hemi; emotional lability; vision impairments (needs further testing to determine extent) Restrictions Weight Bearing Restrictions: No   Therapy/Group: Individual Therapy  Ailene Ravel, OTR/L,CBIS  Supplemental OT - [facility]  09/19/2021, 9:17 AM

## 2021-09-19 NOTE — Progress Notes (Signed)
Physical Therapy Session Note  Patient Details  Name: James Wall MRN: [id number] Date of Birth: [birth year]  Today's Date: 09/19/2021 PT Individual Time: 1301-1400 and 1535-1618 PT Individual Time Calculation (min): 59 min and 43 min   Short Term Goals: Week 1:  PT Short Term Goal 1 (Week 1): Pt will perform bed mobility with CGA. PT Short Term Goal 2 (Week 1): Pt will perform bed to chair transfer with CGA. PT Short Term Goal 3 (Week 1): Pt will ambulate 150' with CGA and LRAD. PT Short Term Goal 4 (Week 1): Pt will improved Berg by [facility].    Skilled Therapeutic Interventions/Progress Updates:  Session 1.  Pt received supine in bed and agreeable to PT. Supine>sit transfer with supervision assist and cues for use of LUE to push into sitting.   Stand pivot transfer to [facility] with SPC and CGA. Pt transported to rehab gym in [facility]. Gait training with SPC and CGA-supervision assist x 17f.   Dynamci gait training with SPC to weave through cones, step over 1 inch obstacles, forward/reverse. Side stepping in parallel bars.   Dynamic balance training to perform reciprocal foot tap on 6inch cones.   Patient returned to room and performed stand [facility]. Pt left sitting in recliner with call bell in reach and all needs met.     Session 2.   Pt received sitting in WC and agreeable to PT  PT instructed pt in DGI. See below for results. Demonstrates increased fall risk with score of 11/24. (<19 indicates increased fall risk)   Nustep reciprocal movement training BLE only x 3 min and BLE/BUE x 2 with with ace warp to the LUE.   Sit<>stand and stand pivot transfers performed with supervision assist throughout session and increased time to perform movment by pt.   Pt returned to room and performed stand pivot transfer to bed with  CGA and SPC. Sit>supine completed with supervision assist for improved position of the LUE, and left supine in bed with  call bell in reach and all needs met.       Therapy Documentation Precautions:  Precautions Precautions: Fall Precaution Comments: L hemi; emotional lability; vision impairments (needs further testing to determine extent) Restrictions Weight Bearing Restrictions: No    Vital Signs: Therapy Vitals Temp: 97.6 F (36.4 C) Temp Source: Oral Pulse Rate: (!) 51 Resp: 18 BP: (!) 160/87 Patient Position (if appropriate): Lying Oxygen Therapy SpO2: 100 % O2 Device: Room Air Pain: Pain Assessment Pain Scale: 0-10 Pain Score: 0-No pain     Balance: Standardized Balance Assessment Standardized Balance Assessment: Dynamic Gait Index Dynamic Gait Index Level Surface: Moderate Impairment Change in Gait Speed: Severe Impairment Gait with Horizontal Head Turns: Mild Impairment Gait with Vertical Head Turns: Mild Impairment Gait and Pivot Turn: Mild Impairment Step Over Obstacle: Moderate Impairment Step Around Obstacles: Mild Impairment Steps: Moderate Impairment Total Score: 11   Therapy/Group: Individual Therapy  ALorie Phenix8/17/2023, 2:05 PM

## 2021-09-19 NOTE — Progress Notes (Signed)
Nurse entered patient's room to give patient 3240m of Renvela. Upon scanning that medication, patient states that he only wants to take 24036mof it. Nurse educated patient that this is the dose prescribed by the doctor and nurse cannot alter the dosage for the patient. Patient stated "I have taken this medication for 15 years I would know." Nurse documented that patient refused the medication and patient states that he will speak with the doctor tomorrow and "not to lie on him." DaAnnandaleA notified.      InGladstone LighterLPN

## 2021-09-19 NOTE — Progress Notes (Signed)
Speech Language Pathology Daily Session Note  Patient Details  Name: James Wall MRN: [id number] Date of Birth: 1965-03-06  Today's Date: 09/19/2021 SLP Individual Time: 1000-1100 SLP Individual Time Calculation (min): 60 min  Short Term Goals: Week 1: SLP Short Term Goal 1 (Week 1): Pt will utilize speech intelligiblity strategies at the conversational level with mod I SLP Short Term Goal 2 (Week 1): Patient will complete mildly complex problem solving tasks with min-to-mod A verbal/visual cues SLP Short Term Goal 3 (Week 1): Patient will utilize external memory strategies to recall novel information with min A SLP Short Term Goal 4 (Week 1): Patient will demonstrate awareness to errors during functional cognitive tasks with min A  Skilled Therapeutic Interventions: Skilled ST treatment focused on cognitive goals. SLP facilitated session by providing education on external memory strategies using educational handout as reference. Pt utilized external compensation effectively with min A fading to sup A verbal cues + encouragement to recall when bills were due and to call his sister after session. SLP initiated discussion on medication management practices and utilized medication chart to discuss current medication regime. Pt was familiar with 75% of medications by name and purpose before education and ~90% accurate with sup A verbal cues following education and brief delay. Min A verbal redirection was beneficial for topic maintenance throughout session. Patient transferred from wheelchair to bed using cane and sup A was left in bed with alarm activated and immediate needs within reach at end of session. Continue per current plan of care.      Pain None/denied   Therapy/Group: Individual Therapy  Patty Sermons 09/19/2021, 3:55 PM

## 2021-09-19 NOTE — Patient Instructions (Signed)
Gravity Eliminated - UE ROM (table)  Complete __2-3_____ times day.    Reaching for Target    Provide 4-5 targets on the table and try to touch each one twice before needing a break.     Shoulder flexion    1) Move right arm forward and back 5-10X   2) Next straighten arm out and move it side to side. 10X  Elbow Flexion and Extension  Start with elbow bent and arm resting on the table. Straighten your elbow out and then bend it in. Make sure your shoulder does not move.   Complete 5 times. Rest. Then complete 5 more times.

## 2021-09-19 NOTE — Telephone Encounter (Signed)
Pt wanted to make provider aware that he is currently in the hospital at [facility] due to having a stroke. Please advise

## 2021-09-19 NOTE — Patient Care Conference (Signed)
Inpatient RehabilitationTeam Conference and Plan of Care Update Date: 09/18/2021   Time: 10:13 AM    Patient Name: James Wall [facility]      Medical Record Number: [id number]  Date of Birth: 06-Dec-1965 Sex: Male         Room/Bed: 4M05C/4M05C-01 Payor Info: Payor: MEDICARE / Plan: MEDICARE PART A AND B / Product Type: *No Product type* /    Admit Date/Time:  09/16/2021  5:59 PM  Primary Diagnosis:  CVA (cerebral vascular accident) [facility])  Hospital Problems: Principal Problem:   CVA (cerebral vascular accident) [facility]) Active Problems:   Right middle cerebral artery stroke [facility])    Expected Discharge Date: Expected Discharge Date: 10/01/21  Team Members Present: Physician leading conference: Dr. Alysia Penna Social Worker Present: Loralee Pacas, [place] Nurse Present: Dorien Chihuahua, RN PT Present: Barrie Folk, PT OT Present: Meriel Pica, OT SLP Present: Sherren Kerns, SLP PPS Coordinator present : Gunnar Fusi, SLP     Current Status/Progress Goal Weekly Team Focus  Bowel/Bladder   doesnt make urine but does have urge sometimes and continent of bowel.  Remain continent  Assess Qshfit and PRN   Swallow/Nutrition/ Hydration             ADL's   mod A with self care; min A with HHA with transfers  supervision overall  LUE NMR, balance, vision, pt education   Mobility   supervision assist bed mobility. CGA-min assist transfers with no AD. min assist giat with no AD and CGA with SPC.  Mod I bed mobility and trasnfers and gait with LRAD.  balance. transfers. gait. neuromotor re-ed. family education. coordination   Communication   sup A  Ind  Implementation of speech intelligiblity strategies   Safety/Cognition/ Behavioral Observations  min A  mod I memory, sup A prob solving, awareness  memory strategies, error awareness, complex problem solving   Pain   Denies pain.  Remain free of pain.  Assess qshift and PRN.   Skin   Skin intact.  Skin to remain intact.  Assess  qshift and PRN.     Discharge Planning:  Patient has supervision/MOD I goals. Discharging home with his sister and friend able to provide intermittent assistance. HD: M,W, F. 1 step to enter the home.   Team Discussion: Patient with left LE weakness post CVA; hx of anemia and HTN with ESRD; HD M-W-F; nephrology monitoring. Note poor activity tolerance and mild cognitive deficits with poor awareness. Patient is hyper-verbose and requires verbal redirection.  Patient on target to meet rehab goals: yes, currently needs HHA ambulation with min assist. Able to complete lower body ADLs with mod assist andneeds min assist for left UE movement. Completes transfers without an assistive device with CGA. Ambulates with min assist. Patient prefers a [facility] however staff recommend use of a walker to provide feedback to the Left UE. Goals for discharge set for supervision overall with mod I for transfers except supervision for steps.   *See Care Plan and progress notes for long and short-term goals.   Revisions to Treatment Plan:  N/a  Teaching Needs: Safety, transfers, medications, secondary risk management, dietary modification, etc  Current Barriers to Discharge: Decreased caregiver support, Home enviroment access/layout, and Hemodialysis  Possible Resolutions to Barriers: Family education HH follow up services DME; TBD     Medical Summary Current Status: BP still on hi side  but drops with HD, pt with Left hemiparesis, no sig changes thus far  Barriers to Discharge: Medical stability;Hemodialysis  Possible Resolutions to [employer] Focus: nephro to assist with med management   Continued Need for Acute Rehabilitation Level of Care: The patient requires daily medical management by a physician with specialized training in physical medicine and rehabilitation for the following reasons: Direction of a multidisciplinary physical rehabilitation program to maximize functional independence :  Yes Medical management of patient stability for increased activity during participation in an intensive rehabilitation regime.: Yes Analysis of laboratory values and/or radiology reports with any subsequent need for medication adjustment and/or medical intervention. : Yes   I attest that I was present, lead the team conference, and concur with the assessment and plan of the team.   Dorien Chihuahua B 09/19/2021, 10:57 AM

## 2021-09-19 NOTE — Progress Notes (Signed)
PROGRESS NOTE   Subjective/Complaints:  Hgb improved.  Discussed d/c date , discussed BP with nephro  ROS- neg N/V/D no CP or SOB  Objective:   No results found. Recent Labs    09/17/21 0545 09/18/21 0808  WBC 3.2* 3.2*  HGB 7.0* 8.2*  HCT 20.2* 23.7*  PLT 138* 120*    Recent Labs    09/17/21 0545 09/18/21 0808  NA 136 134*  K 3.9 4.2  CL 99 97*  CO2 27 25  GLUCOSE 87 98  BUN 18 29*  CREATININE 7.11* 9.70*  CALCIUM 9.8 10.4*     Intake/Output Summary (Last 24 hours) at 09/19/2021 0743 Last data filed at 09/19/2021 0729 Gross per 24 hour  Intake 120 ml  Output 3 ml  Net 117 ml         Physical Exam: Vital Signs Blood pressure (!) 173/93, pulse 61, temperature 98.1 F (36.7 C), temperature source Oral, resp. rate 18, height 5' 5"  (1.651 m), weight 85.1 kg, SpO2 99 %.   General: No acute distress Mood and affect are appropriate Heart: Regular rate and rhythm no rubs murmurs or extra sounds Lungs: Clear to auscultation, breathing unlabored, no rales or wheezes Abdomen: Positive bowel sounds, soft nontender to palpation, nondistended Extremities: No clubbing, cyanosis, or edema Skin: No evidence of breakdown, no evidence of rash, HD cath site without erythema or fluctuance  Neurologic: Cranial nerves II through XII intact, motor strength is 5/5 in right  deltoid, bicep, tricep, grip, hip flexor, knee extensors, ankle dorsiflexor and plantar flexor 3- LUE, 2- LLE Sensory exam normal sensation to light touch and proprioception in bilateral upper and lower extremities  Musculoskeletal: Full range of motion in all 4 extremities. No joint swelling   Assessment/Plan: 1. Functional deficits which require 3+ hours per day of interdisciplinary therapy in a comprehensive inpatient rehab setting. Physiatrist is providing close team supervision and 24 hour management of active medical problems listed  below. Physiatrist and rehab team continue to assess barriers to discharge/monitor patient progress toward functional and medical goals  Care Tool:  Bathing    Body parts bathed by patient: Left arm, Chest, Abdomen, Front perineal area, Right upper leg, Left upper leg, Face   Body parts bathed by helper: Buttocks, Right arm, Right lower leg, Left lower leg     Bathing assist Assist Level: Moderate Assistance - Patient 50 - 74%     Upper Body Dressing/Undressing Upper body dressing   What is the patient wearing?: Pull over shirt    Upper body assist Assist Level: Moderate Assistance - Patient 50 - 74%    Lower Body Dressing/Undressing Lower body dressing      What is the patient wearing?: Pants     Lower body assist Assist for lower body dressing: Moderate Assistance - Patient 50 - 74%     Toileting Toileting Toileting Activity did not occur (Clothing management and hygiene only): N/A (no void or bm)  Toileting assist Assist for toileting: Moderate Assistance - Patient 50 - 74%     Transfers Chair/bed transfer  Transfers assist  Chair/bed transfer activity did not occur: Safety/medical concerns  Chair/bed transfer assist level: Minimal Assistance -  Patient > 75%     Locomotion Ambulation   Ambulation assist      Assist level: Minimal Assistance - Patient > 75% Assistive device: No Device Max distance: 150'   Walk 10 feet activity   Assist     Assist level: Minimal Assistance - Patient > 75% Assistive device: No Device   Walk 50 feet activity   Assist    Assist level: Minimal Assistance - Patient > 75% Assistive device: No Device    Walk 150 feet activity   Assist    Assist level: Minimal Assistance - Patient > 75% Assistive device: No Device    Walk 10 feet on uneven surface  activity   Assist     Assist level: Minimal Assistance - Patient > 75% Assistive device:  (handrail)   Wheelchair     Assist Is the patient using  a wheelchair?: No             Wheelchair 50 feet with 2 turns activity    Assist            Wheelchair 150 feet activity     Assist          Blood pressure (!) 173/93, pulse 61, temperature 98.1 F (36.7 C), temperature source Oral, resp. rate 18, height 5' 5"  (1.651 m), weight 85.1 kg, SpO2 99 %.  Medical Problem List and Plan: 1. 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  Recommendations of 30-day monitor             -patient may shower             -ELOS/Goals: 10/01/2021 supervision goals, pt ha cooking goals but not ready to work on this yet per PT, OT  2.  Antithrombotics: -DVT/anticoagulation:  Mechanical: Antiembolism stockings, thigh (TED hose) Bilateral lower extremities             -antiplatelet therapy: Plavix 75 mg daily only.  DAPT held due to chronic anemia 3. Pain Management: N/A 4. Mood/Behavior/Sleep: Provide emotional support             -antipsychotic agents: N/A 5. Neuropsych/cognition: This patient is capable of making decisions on his own behalf. 6. Skin/Wound Care: Routine skin checks 7. Fluids/Electrolytes/Nutrition: Routine in and outs with follow-up chemistries 8.  Hypertension.  Norvasc 10 mg daily, clonidine patch 0.3 mg weekly, hydralazine 25 mg twice daily, Isordil 20 mg daily, Lopressor 100 mg twice daily.   Vitals:   09/19/21 0336 09/19/21 0400  BP: (!) 173/93   Pulse: (!) 57 61  Resp: 18   Temp: 98.1 F (36.7 C)   SpO2: 99%   Discussed BP management with renal   9.  End-stage renal disease.  Hemodialysis as directed per renal services             -HD at end of day to allow participation in therapies 10.  Hyperlipidemia.  Crestor 11.  Nonalcoholic cirrhosis with ascites.  Paracentesis 2014-2015. 12.  History of prostate cancer.  Followed outpatient by Dr. Gloriann Loan and recently completed radiation therapy.  Continue Flomax 13.  History of first-degree AV block.  No chest pain or  shortness of breath 14.  Tobacco abuse.  Counseling 15.  COPD.  OSA.  Patient no longer on CPAP. 16.  Chronic anemia.  Plan IV iron weekly with ferric per hemodialysis, transfuse if < 7.0 , consider  if pt becomes orthostatic or tachy in therapy .  Hgb improved yesterday ,  may be r/t fluid status   17.  Hx of GERD, took a tablet at home for this will start PPI 18.  Mild thrombocytopenia, no bleeding monitor plt count no sq heparin for DVT prophyllaxis   LOS: 3 days A FACE TO FACE EVALUATION WAS PERFORMED  Charlett Blake 09/19/2021, 7:43 AM

## 2021-09-19 NOTE — Progress Notes (Signed)
  [facility] Progress Note    Assessment/ Plan:   Subacute CVA -symptom onset 8/11, out of window for intervention. 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 No complaints. Tolerated HD yesterday. Was rather pleased with his dialysis experience yesterday.     Objective:   BP (!) 173/93 (BP Location: Right Arm)   Pulse 61   Temp 98.1 F (36.7 C) (Oral)   Resp 18   Ht 5' 5"  (1.651 m)   Wt 85.1 kg   SpO2 99%   BMI 31.22 kg/m   Intake/Output Summary (Last 24 hours) at 09/19/2021 0858 Last data filed at 09/19/2021 0729 Gross per 24 hour  Intake 120 ml  Output 3 ml  Net 117 ml   Weight change:   Physical Exam: Gen:NAD CVS:RRR Resp:normal WOB, CTA BL AHTM:BPJPExt: no sig edema b/l LEs Neuro: left sided weakness noted, awake/alert [facility]: No results found.  Labs: BMET Recent Labs  Lab 09/14/21 1323 09/15/21 0329 09/17/21 0545 09/18/21 0808  NA 139 138 136 134*  K 3.6 4.0 3.9 4.2  CL 102 101 99 97*  CO2 29 26 27 25   GLUCOSE 95 95 87 98  BUN 23* 25* 18 29*  CREATININE 7.52* 8.86* 7.11* 9.70*  CALCIUM 10.1 9.8 9.8 10.4*  PHOS  --   --   --  5.0*   CBC Recent Labs  Lab 09/14/21 1323 09/15/21 0330 09/16/21 0750 09/17/21 0545  09/18/21 0808  WBC 3.0* 2.7* 3.4* 3.2* 3.2*  NEUTROABS 2.0 1.8  --  2.4  --   HGB 6.8* 6.8* 7.7* 7.0* 8.2*  HCT 20.0* 21.3* 22.8* 20.2* 23.7*  MCV 102.6* 105.4* 102.2* 98.1 99.6  PLT 137* 135* 151 138* 120*    Medications:     amLODipine  10 mg Oral QHS   Chlorhexidine Gluconate Cloth  6 each Topical Q12H   [START ON 09/22/2021] cloNIDine  0.3 mg Transdermal Q Sun   clopidogrel  75 mg Oral Daily   hydrALAZINE  25 mg Oral BID   isosorbide dinitrate  20 mg Oral BID   metoprolol tartrate  100 mg Oral BID   pantoprazole  20 mg Oral Daily   rosuvastatin  10 mg Oral Daily   sevelamer carbonate  3,200 mg Oral TID WC   tamsulosin  0.4 mg Oral QPC supper   umeclidinium-vilanterol  1 puff Inhalation Daily      VGean Quint MD [facility] 09/19/2021, 8:58 AM

## 2021-09-19 NOTE — Telephone Encounter (Signed)
Returned call to patient, he states he had a stroke and is currently at [facility].  Patient states he is worried about being able to make it in for appt with Dr. Gasper Sells on 10/22/21. He asked if Dr. Gasper Sells could see him while he is in the hospital instead. Advised patient we would follow-up after he is discharged from the hospital and I will forward this message to Dr. Gasper Sells to make him aware of patient's hospitalization.  Patient expressed appreciation for call.

## 2021-09-20 DIAGNOSIS — I639 Cerebral infarction, unspecified: Secondary | ICD-10-CM | POA: Diagnosis not present

## 2021-09-20 LAB — RENAL FUNCTION PANEL
Albumin: 3.2 g/dL — ABNORMAL LOW (ref 3.5–5.0)
Anion gap: 9 (ref 5–15)
BUN: 32 mg/dL — ABNORMAL HIGH (ref 6–20)
CO2: 29 mmol/L (ref 22–32)
Calcium: 10.3 mg/dL (ref 8.9–10.3)
Chloride: 102 mmol/L (ref 98–111)
Creatinine, Ser: 10.02 mg/dL — ABNORMAL HIGH (ref 0.61–1.24)
GFR, Estimated: 6 mL/min — ABNORMAL LOW (ref >60)
Glucose, Bld: 101 mg/dL — ABNORMAL HIGH (ref 70–99)
Phosphorus: 4.8 mg/dL — ABNORMAL HIGH (ref 2.5–4.6)
Potassium: 3.9 mmol/L (ref 3.5–5.1)
Sodium: 140 mmol/L (ref 135–145)

## 2021-09-20 LAB — CBC
HCT: 23.2 % — ABNORMAL LOW (ref 39.0–52.0)
Hemoglobin: 8 g/dL — ABNORMAL LOW (ref 13.0–17.0)
MCH: 34.3 pg — ABNORMAL HIGH (ref 26.0–34.0)
MCHC: 34.5 g/dL (ref 30.0–36.0)
MCV: 99.6 fL (ref 80.0–100.0)
Platelets: 139 10*3/uL — ABNORMAL LOW (ref 150–400)
RBC: 2.33 MIL/uL — ABNORMAL LOW (ref 4.22–5.81)
RDW: 14.6 % (ref 11.5–15.5)
WBC: 3.6 10*3/uL — ABNORMAL LOW (ref 4.0–10.5)
nRBC: 0 % (ref 0.0–0.2)

## 2021-09-20 MED ORDER — HEPARIN SODIUM (PORCINE) 1000 UNIT/ML IJ SOLN
INTRAMUSCULAR | Status: AC
  Administered 2021-09-20: 1000 [IU]
  Filled 2021-09-20: qty 4

## 2021-09-20 MED ORDER — SODIUM CHLORIDE 0.9% FLUSH: 10.0000 mL | INTRAVENOUS | Status: DC | PRN

## 2021-09-20 NOTE — Progress Notes (Signed)
Physical Therapy Session Note  Patient Details  Name: James Wall MRN: [id number] Date of Birth: 1965/09/17  Today's Date: 09/20/2021 PT Individual Time: 1001-1100 PT Individual Time Calculation (min): 59 min   Short Term Goals: Week 1:  PT Short Term Goal 1 (Week 1): Pt will perform bed mobility with CGA. PT Short Term Goal 2 (Week 1): Pt will perform bed to chair transfer with CGA. PT Short Term Goal 3 (Week 1): Pt will ambulate 150' with CGA and LRAD. PT Short Term Goal 4 (Week 1): Pt will improved Berg by [facility].  Skilled Therapeutic Interventions/Progress Updates:   Pt received sitting in recliner and agreeable to PT.   Stand pivot transfer to WC.   Transported to entrance of [facility] in [place]. Transfers training from various surfaces with supervision assist from PT and cues for UE placement. Gait training with [place] ove runlevel cement sidewalk.   Simulated community mobility in [facility] to access and place order at [employer].   Patient returned to room and performed stand pivot to recliner with ***. Pt left sitting in recliner with call bell in reach and all needs met.         Therapy Documentation Precautions:  Precautions Precautions: Fall Precaution Comments: L hemi; emotional lability; vision impairments (needs further testing to determine extent) Restrictions Weight Bearing Restrictions: No General:   Vital Signs:  Pain: Pain Assessment Pain Scale: 0-10 Pain Score: 0-No pain Mobility:   Locomotion :    Trunk/Postural Assessment :    Balance:   Exercises:   Other Treatments:      Therapy/Group: Individual Therapy  [name] 09/20/2021, 11:02 AM

## 2021-09-20 NOTE — Telephone Encounter (Signed)
Called pt advised of MD response.  Pt became upset and hung up the phone before nurse could evaluate concern.

## 2021-09-20 NOTE — Progress Notes (Signed)
PROGRESS NOTE   Subjective/Complaints:  Discussed with nephro, pt doing ok from their standpoint   ROS- neg N/V/D no CP or SOB  Objective:   No results found. Recent Labs    09/18/21 0808  WBC 3.2*  HGB 8.2*  HCT 23.7*  PLT 120*    Recent Labs    09/18/21 0808  NA 134*  K 4.2  CL 97*  CO2 25  GLUCOSE 98  BUN 29*  CREATININE 9.70*  CALCIUM 10.4*     Intake/Output Summary (Last 24 hours) at 09/20/2021 0758 Last data filed at 09/20/2021 7948 Gross per 24 hour  Intake 610.39 ml  Output --  Net 610.39 ml         Physical Exam: Vital Signs Blood pressure (!) 158/80, pulse (!) 56, temperature 97.8 F (36.6 C), temperature source Oral, resp. rate 20, height 5' 5"  (1.651 m), weight 85.1 kg, SpO2 98 %.   General: No acute distress Mood and affect are appropriate Heart: Regular rate and rhythm no rubs murmurs or extra sounds Lungs: Clear to auscultation, breathing unlabored, no rales or wheezes Abdomen: Positive bowel sounds, soft nontender to palpation, nondistended Extremities: No clubbing, cyanosis, or edema Skin: No evidence of breakdown, no evidence of rash, HD cath site without erythema or fluctuance  Neurologic: Cranial nerves II through XII intact, motor strength is 5/5 in right  deltoid, bicep, tricep, grip, hip flexor, knee extensors, ankle dorsiflexor and plantar flexor 3- LUE, 2- LLE Sensory exam normal sensation to light touch and proprioception in bilateral upper and lower extremities  Musculoskeletal: Full range of motion in all 4 extremities. No joint swelling   Assessment/Plan: 1. Functional deficits which require 3+ hours per day of interdisciplinary therapy in a comprehensive inpatient rehab setting. Physiatrist is providing close team supervision and 24 hour management of active medical problems listed below. Physiatrist and rehab team continue to assess barriers to discharge/monitor  patient progress toward functional and medical goals  Care Tool:  Bathing    Body parts bathed by patient: Left arm, Chest, Abdomen, Front perineal area, Right upper leg, Left upper leg, Face   Body parts bathed by helper: Buttocks, Right arm, Right lower leg, Left lower leg     Bathing assist Assist Level: Moderate Assistance - Patient 50 - 74%     Upper Body Dressing/Undressing Upper body dressing   What is the patient wearing?: Pull over shirt    Upper body assist Assist Level: Moderate Assistance - Patient 50 - 74%    Lower Body Dressing/Undressing Lower body dressing      What is the patient wearing?: Pants     Lower body assist Assist for lower body dressing: Moderate Assistance - Patient 50 - 74%     Toileting Toileting Toileting Activity did not occur (Clothing management and hygiene only): N/A (no void or bm)  Toileting assist Assist for toileting: Moderate Assistance - Patient 50 - 74%     Transfers Chair/bed transfer  Transfers assist  Chair/bed transfer activity did not occur: Safety/medical concerns  Chair/bed transfer assist level: Minimal Assistance - Patient > 75%     Locomotion Ambulation   Ambulation assist  Assist level: Minimal Assistance - Patient > 75% Assistive device: No Device Max distance: 150'   Walk 10 feet activity   Assist     Assist level: Minimal Assistance - Patient > 75% Assistive device: No Device   Walk 50 feet activity   Assist    Assist level: Minimal Assistance - Patient > 75% Assistive device: No Device    Walk 150 feet activity   Assist    Assist level: Minimal Assistance - Patient > 75% Assistive device: No Device    Walk 10 feet on uneven surface  activity   Assist     Assist level: Minimal Assistance - Patient > 75% Assistive device:  (handrail)   Wheelchair     Assist Is the patient using a wheelchair?: No             Wheelchair 50 feet with 2 turns  activity    Assist            Wheelchair 150 feet activity     Assist          Blood pressure (!) 158/80, pulse (!) 56, temperature 97.8 F (36.6 C), temperature source Oral, resp. rate 20, height 5' 5"  (1.651 m), weight 85.1 kg, SpO2 98 %.  Medical Problem List and Plan: 1. 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  Recommendations of 30-day monitor             -patient may shower             -ELOS/Goals: 10/01/2021 supervision goals, pt ha cooking goals but not ready to work on this yet per PT, OT  2.  Antithrombotics: -DVT/anticoagulation:  Mechanical: Antiembolism stockings, thigh (TED hose) Bilateral lower extremities             -antiplatelet therapy: Plavix 75 mg daily only.  DAPT held due to chronic anemia 3. Pain Management: N/A 4. Mood/Behavior/Sleep: Provide emotional support             -antipsychotic agents: N/A 5. Neuropsych/cognition: This patient is capable of making decisions on his own behalf. 6. Skin/Wound Care: Routine skin checks 7. Fluids/Electrolytes/Nutrition: Routine in and outs with follow-up chemistries 8.  Hypertension.  Norvasc 10 mg daily, clonidine patch 0.3 mg weekly, hydralazine 25 mg twice daily, Isordil 20 mg daily, Lopressor 100 mg twice daily.   Vitals:   09/19/21 1303 09/19/21 1928  BP: (!) 160/87 (!) 158/80  Pulse: (!) 51 (!) 56  Resp: 18 20  Temp: 97.6 F (36.4 C) 97.8 F (36.6 C)  SpO2: 100% 98%  Discussed BP management with renal   9.  End-stage renal disease.  Hemodialysis as directed per renal services             -HD at end of day to allow participation in therapies 10.  Hyperlipidemia.  Crestor 11.  Nonalcoholic cirrhosis with ascites.  Paracentesis 2014-2015. 12.  History of prostate cancer.  Followed outpatient by Dr. Gloriann Loan and recently completed radiation therapy.  Continue Flomax 13.  History of first-degree AV block.  No chest pain or shortness of breath 14.   Tobacco abuse.  Counseling 15.  COPD.  OSA.  Patient no longer on CPAP. 16.  Chronic anemia.  Plan IV iron weekly with ferric per hemodialysis, transfuse if < 7.0 , consider  if pt becomes orthostatic or tachy in therapy .  Hgb improved yesterday , may be r/t fluid status   17.  Hx of GERD,  took a tablet at home for this will start PPI 18.  Mild thrombocytopenia, no bleeding monitor plt count no sq heparin for DVT prophyllaxis   LOS: 4 days A FACE TO FACE EVALUATION WAS PERFORMED  Charlett Blake 09/20/2021, 7:58 AM

## 2021-09-20 NOTE — Progress Notes (Signed)
Speech Language Pathology Daily Session Note  Patient Details  Name: James Wall MRN: [id number] Date of Birth: 1965/11/10  Today's Date: 09/20/2021 SLP Individual Time: 0906-1001 SLP Individual Time Calculation (min): 55 min  Short Term Goals: Week 1: SLP Short Term Goal 1 (Week 1): Pt will utilize speech intelligiblity strategies at the conversational level with mod I SLP Short Term Goal 2 (Week 1): Patient will complete mildly complex problem solving tasks with min-to-mod A verbal/visual cues SLP Short Term Goal 3 (Week 1): Patient will utilize external memory strategies to recall novel information with min A SLP Short Term Goal 4 (Week 1): Patient will demonstrate awareness to errors during functional cognitive tasks with min A  Skilled Therapeutic Interventions: Skilled ST treatment focused on cognitive goals. Pt received upright in recliner chair on arrival and in good spirits. SLP facilitated mildly complex calendar organization task with sup A verbal cues for self monitoring, repairing error x1, and mild modifications to organizational techniques. Pt demonstrated appropriate problem solving and reasoning skills for task. Pt was perceived as mildly verbose and benefited from sup A verbal redirection cues. Patient was left in chair with alarm activated and immediate needs within reach at end of session. Continue per current plan of care.      Pain Pain Assessment Pain Scale: 0-10 Pain Score: 0-No pain  Therapy/Group: Individual Therapy  Patty Sermons 09/20/2021, 9:17 AM

## 2021-09-20 NOTE — Progress Notes (Addendum)
Patient ID: James Wall, male   DOB: October 17, 1965, 56 y.o.   MRN: [id number]  [facility] referral sent to [place]. Patient approved, orders sent.

## 2021-09-20 NOTE — Progress Notes (Signed)
Encourage patient to stay on HD treatment as prescribed by MD.Patient is refusing,he wants only  3 hours with goal fluid removal of 3 liters.Patient signed an Early Termination of treatment form.Prescribing m.d made aware.

## 2021-09-20 NOTE — Progress Notes (Signed)
IV nurse Horris Latino came to change pt HD cath dressing. On assessment notified me that pt cuff was exposed and sutures not attached to skin. Charge nurse notified. On call dialysis provider called. No new orders at this time. Per Dr. Hollie Salk, catheter will need to be changed before next treatment. No complications noted at this time. Sheela Stack, LPN

## 2021-09-20 NOTE — Progress Notes (Signed)
Patient requested to changed dressing.RN  explained it would be due to change on Aug 21,2023,Present dressing was changed Aug 14,2023.Marland KitchenWhile the nurse was changing the dressing ,patient had a severe legs cramping  and was insisting on getting out of bed.Microbial disc fell,Planning to re-do the dressing but when nurse comeback,transport person had gotten him out going to back to his room.Nurse ordered Iv team consult to re-do the HD cath dressing.

## 2021-09-20 NOTE — Progress Notes (Signed)
Consulted to change dressing and place disc at insertion site. Bedside RN to notify M.D. catheter cuff exposed with no sutures attached.

## 2021-09-20 NOTE — Progress Notes (Signed)
Patient ID: James Wall, male   DOB: 20-May-1965, 56 y.o.   MRN: [id number] Met with the patient to review current situation, team conference, rehab process and plan of care. Reviewed secondary risks including ESRD/HD, HTN, HLD, and smoking. Reviewed medication management and dietary modification recommendations. Continue to follow along to discharge to address educational needs to facilitate preparation for discharge home alone with sister and friend to check in on him. Margarito Liner

## 2021-09-20 NOTE — Progress Notes (Signed)
Occupational Therapy Session Note  Patient Details  Name: James Wall MRN: [id number] Date of Birth: [birth year]  Today's Date: 09/20/2021 OT Individual Time: 9449 - 6759 & 1638 - 4665 OT Individual Time Calculation (min): 31 min & 57 min    Short Term Goals: Week 1:  OT Short Term Goal 1 (Week 1): Pt will complete grooming standing at the sink with CGA for safety. OT Short Term Goal 2 (Week 1): Pt will complete UB dressing with supervision using hemi-techniques while seated. OT Short Term Goal 3 (Week 1): Pt will complete LB dressing with CGA for safety using hemi-techniques. OT Short Term Goal 4 (Week 1): Pt will complete toileting and toilet transfer with CGA for safety with LRAD as needed. OT Short Term Goal 5 (Week 1): Pt will complete tub-shower transfer using a tub-bench with CGA for safety and LRAD as needed.  First Session [phone] - [phone]) - Skilled Therapeutic Interventions/Progress Updates:  Pt awake and dressed while seated at EOB upon OT arrival to the room. Pt reports, "I would like to wash my hands and brush my teeth." Pt in agreement for OT session.   Therapy Documentation Precautions:  Precautions Precautions: Fall Precaution Comments: L hemi; emotional lability; vision impairments (needs further testing to determine extent) Restrictions Weight Bearing Restrictions: No Vital Signs: Please see "Flowsheet" for most recent vitals charted by nursing staff.  Pain: Pain Assessment Pain Scale: 0-10 Pain Score: 0-No pain  ADL: Grooming: Supervision/safety (Pt able to perform hand hygiene with close supervision standing at the sink. Pt requests assistance with brushing nails for improved hand hygiene and pt requires maximal assistance for brushing under nails due to decreased L hand coordination.) Where Assessed-Grooming: Standing at sink Where Assessed-Upper Body Dressing: Edge of bed Lower Body Dressing:  (Pt reports being able to don shoes sitting at EOB with  modified independence using elastic shoelaces.) Where Assessed-Lower Body Dressing: Edge of bed (& standing to pull clothing up) Toileting:  (Offered assistance, however, pt declines need at this time.) ADL Comments: Pt demo's good awareness to LUE throughout ADLs. Pt able to perform sit <> stand from EOB with CGA, ambulate from EOB > sink > recliner with no AD and CGA for safety. Pt demo's no SOB or LOB throughout mobility.  Therapeutic Activity: Pt reports difficulty with squeeze blue foam block with L hand and requires use of R hand. OT cut block into smaller blocks to encourage pt to perform gross grasp and placing in a container for more controlled grasp & release. Pt able to grasp 1" blocks with gross grasp (weak) and place in cup with assist with RUE to perform shoulder elevation to grasp & place in container. Pt performing task upon OT exit from room for improve motor control of LUE.  Pt requested to stay in the recliner at end of session. Pt left sitting comfortably in the recliner with personal belongings and call light within reach, belt alarm placed and activated, and comfort needs attended to.   --------------------------------------------------------------------------------------------------------------------------------------------------------------------------------------------------------------------------  Second Session (1109 - 1206) - Skilled Therapeutic Interventions/Progress Updates:  Pt awake seated in recliner upon OT arrival to the room. Pt reports, "Oh yeah. I'm doing great. I went by [facility] and got lots of good stuff.." Pt in agreement for OT session. Pt's sister arrived shortly after OT session began and stayed throughout session.    Therapy Documentation Pain:  09/20/21 1109  Pain Assessment  Pain Scale 0-10  Pain Score 0    Therapeutic Activity: Pt  participates in LUE neuro muscular re-educaiton tasks in order to improve LUE motor control which is needed for  improved independence in ADL's and functional mobility. Pt in agreement to have a visit from the therapy dog. Pt encouraged to pet therapy dog with L hand to provide multi-sensory input and improved functional reaching/repetitive movements with LUE for improved motor control. Pt able to pet therapy dog with L hand using minimal assist from RUE for proper placement for petting. Pt then requests to go outside. Pt in agreement for LUE neuromuscular re-education while sitting outside. Pt reports that using the foam 1" blocks to grasp and place in a container instead of performing gross grasp on the full foam block. Pt participates in various LUE neuro re-education activities while outside. Pt encouraged to perform grasp and realize with suction cup "Squiggs" at table-top with pt requiring intermittent assistance from RUE to perform adequate shoulder flexion to reach and attempt to perform grasp/release. Pt requires minimal - moderate assistance for proper positioning of LUE in order to pt to performing grasping. Pt able to perform active gross grasping to grasp objects, however, demo's increased difficulty with releasing. Pt able to cross midline to give object to therapist using compensatory trunk movements needed to assist with performing horizontal abd/add. Pt then participates in neuro re-education activity in order to encourage improved digital extension. Pt able to grasp white theraputty with a weak gross grasp, however, encouraged pt to perform rolling of putty with flat palm to improve extensor strength and stabilization. Pt able to complete with minimal assist from RUE to perform protraction/retraction needed to perform rolling motions. Pt provided with total assistance to transport in w/c form room <> outside.   Functional Mobility: Pt able to perform functional ambulation from recliner > sink to perform hand hygiene after petting therapy dog > w/c > recliner with CGA and no AD. Pt demo's short stepping  patterns and requires moderate encouragement from OT to improve strides with ambulation. However, pt demo's no LOB. Pt able to perform hand hygiene standing at the sink with CGA for safety with standing balance. However, pt involved LUE in hand hygiene with good attention to the extremity.   Pt requested to stay in the recliner at end of session. Pt left sitting comfortably in the recliner with personal belongings and call light within reach, belt alarm placed and activated, sister present in room, and comfort needs attended to.   Therapy/Group: Individual Therapy  Barbee Shropshire 09/20/2021, 12:41 PM

## 2021-09-20 NOTE — Progress Notes (Signed)
  [facility] Progress Note    Assessment/ Plan:   Subacute CVA -symptom onset 8/11, out of window for intervention. 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 No complaints. Had issues with renvela yesterday. Takes 3 to 4 tablets with meals (depending on meal, portion size). Was given 4 tablets, took only 3 (because of size of portion), other tablet was discarded.   Objective:   BP (!) 158/80 (BP Location: Right Arm)   Pulse (!) 56   Temp 97.8 F (36.6 C) (Oral)   Resp 20   Ht 5' 5"  (1.651 m)   Wt 85.1 kg   SpO2 98%   BMI 31.22 kg/m   Intake/Output Summary (Last 24 hours) at 09/20/2021 0856 Last data filed at 09/20/2021 06754Gross per 24 hour  Intake 610.39 ml  Output --  Net 610.39 ml   Weight change:   Physical Exam: Gen:NAD CVS:RRR Resp:normal WOB, CTA BL AGBE:EFEOExt: no sig edema b/l LEs Neuro: left sided weakness noted, awake/alert [facility]: No results found.  Labs: BMET Recent Labs  Lab 09/14/21 1323 09/15/21 0329 09/17/21 0545 09/18/21 0808  NA 139 138 136 134*  K 3.6 4.0 3.9 4.2  CL 102 101 99 97*  CO2 29 26 27 25   GLUCOSE 95 95 87 98  BUN 23* 25* 18 29*  CREATININE 7.52* 8.86* 7.11* 9.70*  CALCIUM 10.1 9.8 9.8 10.4*  PHOS   --   --   --  5.0*   CBC Recent Labs  Lab 09/14/21 1323 09/15/21 0330 09/16/21 0750 09/17/21 0545 09/18/21 0808  WBC 3.0* 2.7* 3.4* 3.2* 3.2*  NEUTROABS 2.0 1.8  --  2.4  --   HGB 6.8* 6.8* 7.7* 7.0* 8.2*  HCT 20.0* 21.3* 22.8* 20.2* 23.7*  MCV 102.6* 105.4* 102.2* 98.1 99.6  PLT 137* 135* 151 138* 120*    Medications:     amLODipine  10 mg Oral QHS   Chlorhexidine Gluconate Cloth  6 each Topical Q12H   [START ON 09/22/2021] cloNIDine  0.3 mg Transdermal Q Sun   clopidogrel  75 mg Oral Daily   hydrALAZINE  25 mg Oral BID   isosorbide dinitrate  20 mg Oral BID   metoprolol tartrate  100 mg Oral BID   pantoprazole  20 mg Oral Daily   rosuvastatin  10 mg Oral Daily   sevelamer carbonate  3,200 mg Oral TID WC   tamsulosin  0.4 mg Oral QPC supper   umeclidinium-vilanterol  1 puff Inhalation Daily      VGean Quint MD [facility] 09/20/2021, 8:56 AM

## 2021-09-21 DIAGNOSIS — Z992 Dependence on renal dialysis: Secondary | ICD-10-CM | POA: Diagnosis not present

## 2021-09-21 DIAGNOSIS — N186 End stage renal disease: Secondary | ICD-10-CM | POA: Diagnosis not present

## 2021-09-21 DIAGNOSIS — I63511 Cerebral infarction due to unspecified occlusion or stenosis of right middle cerebral artery: Secondary | ICD-10-CM

## 2021-09-21 DIAGNOSIS — I10 Essential (primary) hypertension: Secondary | ICD-10-CM | POA: Diagnosis not present

## 2021-09-21 MED ORDER — METOPROLOL TARTRATE 50 MG PO TABS
50.0000 mg | ORAL_TABLET | Freq: Once | ORAL | Status: AC
  Administered 2021-09-21: 50 mg via ORAL
  Filled 2021-09-21: qty 1

## 2021-09-21 NOTE — Progress Notes (Signed)
Occupational Therapy Session Note  Patient Details  Name: James Wall MRN: [id number] Date of Birth: [birth year]  Today's Date: 09/21/2021 OT Individual Time: 5320-2334 OT Individual Time Calculation (min): 60 min    Short Term Goals: Week 1:  OT Short Term Goal 1 (Week 1): Pt will complete grooming standing at the sink with CGA for safety. OT Short Term Goal 2 (Week 1): Pt will complete UB dressing with supervision using hemi-techniques while seated. OT Short Term Goal 3 (Week 1): Pt will complete LB dressing with CGA for safety using hemi-techniques. OT Short Term Goal 4 (Week 1): Pt will complete toileting and toilet transfer with CGA for safety with LRAD as needed. OT Short Term Goal 5 (Week 1): Pt will complete tub-shower transfer using a tub-bench with CGA for safety and LRAD as needed.  Skilled Therapeutic Interventions/Progress Updates:    Upon OT arrival, pt semi recumbent in bed and reports no pain. Pt pleasant and agreeable to OT treatment. Treatment intervention with a focus on self care retraining and functional mobility. Pt completes supine to sit transfer with SBA and sits EOB to donn shoes with setup. Pt completes sit to stand transfer with CGA and ambulates with SPC and CGA to sink to complete shaving with CGA. Increased time required to complete. Pt's sister arrived during session to provide fresh clothes and pt ambulates without DME to his chair to retrieve shirt with CGA and doff/donns shirt while standing without device and CGA. Pt ambulates to his bed and completes stand to sit transfer with CGA. Pt requests to go outside and completes stand pivot transfer into w/c with Supervision. Pt was transported outside via w/c and Total A for time management. Pt completes sit to stand with CGA using SPC and ambulates ~95' with CGA at a slow pace. Pr reports his HR "feels funny". Pt returns to seated position with CGA and HR read at 79 and O2 at 98%. Pt was returned to his room via  w/c and Total A for time management and completes stand step transfer with CGA to recliner and was left in recliner with all needs met.   Therapy Documentation Precautions:  Precautions Precautions: Fall Precaution Comments: L hemi; emotional lability; vision impairments (needs further testing to determine extent) Restrictions Weight Bearing Restrictions: No   Therapy/Group: Individual Therapy  Ozella Comins 09/21/2021, 12:00 PM

## 2021-09-21 NOTE — Progress Notes (Signed)
Speech Language Pathology Daily Session Note  Patient Details  Name: James Wall MRN: [id number] Date of Birth: 1965-10-22  Today's Date: 09/21/2021 SLP Individual Time: 1430-1515 SLP Individual Time Calculation (min): 45 min  Short Term Goals: Week 1: SLP Short Term Goal 1 (Week 1): Pt will utilize speech intelligiblity strategies at the conversational level with mod I SLP Short Term Goal 2 (Week 1): Patient will complete mildly complex problem solving tasks with min-to-mod A verbal/visual cues SLP Short Term Goal 3 (Week 1): Patient will utilize external memory strategies to recall novel information with min A SLP Short Term Goal 4 (Week 1): Patient will demonstrate awareness to errors during functional cognitive tasks with min A  Skilled Therapeutic Interventions: Pt seen for skilled ST with focus on cognitive goals, pt asleep in recliner upon entry but easily roused and agreeable to therapeutic tasks. SLP facilitating mildly complex problem solving tasks with overall min A verbal cues for error awareness and repair. Pt reports feeling close to baseline function but would like to continue to target medication, money and time management tasks. SLP reviewing basic speech intelligibility strategies, pt speaking at conversation level with >90% intelligibility throughout tx session. Pt left in recliner with alarm belt intact and all needs within reach, cont ST POC.    Pain Pain Assessment Pain Scale: 0-10 Pain Score: 0-No pain  Therapy/Group: Individual Therapy  James Wall 09/21/2021, 3:12 PM

## 2021-09-21 NOTE — Progress Notes (Signed)
PROGRESS NOTE   Subjective/Complaints:  Had questions about HD cath. Catheter cuff exposed without sutures.   ROS: Patient denies fever, rash, sore throat, blurred vision, dizziness, nausea, vomiting, diarrhea, cough, shortness of breath or chest pain, joint or back/neck pain, headache, or mood change.   Objective:   No results found.  Recent Labs    09/20/21 1453  WBC 3.6*  HGB 8.0*  HCT 23.2*  PLT 139*   Recent Labs    09/20/21 1453  NA 140  K 3.9  CL 102  CO2 29  GLUCOSE 101*  BUN 32*  CREATININE 10.02*  CALCIUM 10.3    Intake/Output Summary (Last 24 hours) at 09/21/2021 0902 Last data filed at 09/21/2021 0734 Gross per 24 hour  Intake 240 ml  Output --  Net 240 ml        Physical Exam: Vital Signs Blood pressure (!) 163/82, pulse (!) 51, temperature (!) 97.5 F (36.4 C), resp. rate 18, height 5' 5"  (1.651 m), weight 78.4 kg, SpO2 100 %.   Constitutional: No distress . Vital signs reviewed. HEENT: NCAT, EOMI, oral membranes moist Neck: supple Cardiovascular: RRR without murmur. No JVD    Respiratory/Chest: CTA Bilaterally without wheezes or rales. Normal effort    GI/Abdomen: BS +, non-tender, non-distended Ext: no clubbing, cyanosis, or edema Psych: pleasant and cooperative  Skin: No evidence of breakdown, no evidence of rash, HD cath site without erythema, clean, covered  Neurologic: Cranial nerves II through XII intact, motor strength is 5/5 in right  deltoid, bicep, tricep, grip, hip flexor, knee extensors, ankle dorsiflexor and plantar flexor 3- LUE, 2- LLE Sensory exam normal sensation to light touch and proprioception in bilateral upper and lower extremities  Musculoskeletal: Full range of motion in all 4 extremities. No joint swelling   Assessment/Plan: 1. Functional deficits which require 3+ hours per day of interdisciplinary therapy in a comprehensive inpatient rehab  setting. Physiatrist is providing close team supervision and 24 hour management of active medical problems listed below. Physiatrist and rehab team continue to assess barriers to discharge/monitor patient progress toward functional and medical goals  Care Tool:  Bathing    Body parts bathed by patient: Left arm, Chest, Abdomen, Front perineal area, Right upper leg, Left upper leg, Face   Body parts bathed by helper: Buttocks, Right arm, Right lower leg, Left lower leg     Bathing assist Assist Level: Moderate Assistance - Patient 50 - 74%     Upper Body Dressing/Undressing Upper body dressing   What is the patient wearing?: Pull over shirt    Upper body assist Assist Level: Moderate Assistance - Patient 50 - 74%    Lower Body Dressing/Undressing Lower body dressing      What is the patient wearing?: Pants     Lower body assist Assist for lower body dressing: Moderate Assistance - Patient 50 - 74%     Toileting Toileting Toileting Activity did not occur (Clothing management and hygiene only): N/A (no void or bm)  Toileting assist Assist for toileting: Moderate Assistance - Patient 50 - 74%     Transfers Chair/bed transfer  Transfers assist  Chair/bed transfer activity did not occur:  Safety/medical concerns  Chair/bed transfer assist level: Minimal Assistance - Patient > 75%     Locomotion Ambulation   Ambulation assist      Assist level: Minimal Assistance - Patient > 75% Assistive device: No Device Max distance: 150'   Walk 10 feet activity   Assist     Assist level: Minimal Assistance - Patient > 75% Assistive device: No Device   Walk 50 feet activity   Assist    Assist level: Minimal Assistance - Patient > 75% Assistive device: No Device    Walk 150 feet activity   Assist    Assist level: Minimal Assistance - Patient > 75% Assistive device: No Device    Walk 10 feet on uneven surface  activity   Assist     Assist level:  Minimal Assistance - Patient > 75% Assistive device:  (handrail)   Wheelchair     Assist Is the patient using a wheelchair?: No             Wheelchair 50 feet with 2 turns activity    Assist            Wheelchair 150 feet activity     Assist          Blood pressure (!) 163/82, pulse (!) 51, temperature (!) 97.5 F (36.4 C), resp. rate 18, height 5' 5"  (1.651 m), weight 78.4 kg, SpO2 100 %.  Medical Problem List and Plan: 1. 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  Recommendations of 30-day monitor             -patient may shower             -ELOS/Goals: 10/01/2021 supervision goals  -Continue CIR therapies including PT, OT, and SLP   2.  Antithrombotics: -DVT/anticoagulation:  Mechanical: Antiembolism stockings, thigh (TED hose) Bilateral lower extremities             -antiplatelet therapy: Plavix 75 mg daily only.  DAPT held due to chronic anemia 3. Pain Management: N/A 4. Mood/Behavior/Sleep: Provide emotional support             -antipsychotic agents: N/A 5. Neuropsych/cognition: This patient is capable of making decisions on his own behalf. 6. Skin/Wound Care: Routine skin checks 7. Fluids/Electrolytes/Nutrition: Routine in and outs with follow-up chemistries 8.  Hypertension.  Norvasc 10 mg daily, clonidine patch 0.3 mg weekly, hydralazine 25 mg twice daily, Isordil 20 mg daily, Lopressor 100 mg twice daily.   Vitals:   09/20/21 1929 09/20/21 2038  BP: (!) 163/82   Pulse: (!) 53 (!) 51  Resp: 18   Temp: (!) 97.5 F (36.4 C)   SpO2: 100%     BP management per nephrology   9.  End-stage renal disease.  Hemodialysis as directed per renal services             -HD at end of day to allow participation in therapies  -pt for catheter replacement. Asked RN to check with nephro regarding catheter 10.  Hyperlipidemia.  Crestor 11.  Nonalcoholic cirrhosis with ascites.  Paracentesis 2014-2015. 12.   History of prostate cancer.  Followed outpatient by Dr. Gloriann Loan and recently completed radiation therapy.  Continue Flomax 13.  History of first-degree AV block.  No chest pain or shortness of breath 14.  Tobacco abuse.  Counseling 15.  COPD.  OSA.  Patient no longer on CPAP. 16.  Chronic anemia.    IV iron weekly with ferric per hemodialysis,  transfuse if < 7.0 , consider  if pt becomes orthostatic or tachy in therapy .  Hgb improved yesterday , may be r/t fluid status   17.  Hx of GERD, took a tablet at home for this will start PPI 18.  Mild thrombocytopenia, no bleeding monitor plt count no sq heparin for DVT prophyllaxis   LOS: 5 days A FACE TO FACE EVALUATION WAS PERFORMED  Meredith Staggers 09/21/2021, 9:02 AM

## 2021-09-21 NOTE — Progress Notes (Signed)
Notified that patients HD catheter was partially removed by nurse. On assessment, sutures appear to be 2-3 inches away from access point. CIR on call provider notified. On call nephrology provider notified. Nephrology states that they will assess Monday.

## 2021-09-21 NOTE — Progress Notes (Signed)
Physical Therapy Session Note  Patient Details  Name: James Wall MRN: [id number] Date of Birth: 1965/09/30  Today's Date: 09/21/2021 PT Individual Time: 1535-1630 PT Individual Time Calculation (min): 55 min   Short Term Goals: Week 1:  PT Short Term Goal 1 (Week 1): Pt will perform bed mobility with CGA. PT Short Term Goal 2 (Week 1): Pt will perform bed to chair transfer with CGA. PT Short Term Goal 3 (Week 1): Pt will ambulate 150' with CGA and LRAD. PT Short Term Goal 4 (Week 1): Pt will improved Berg by MCID. Week 2:     Skilled Therapeutic Interventions/Progress Updates:   Pt received sitting in recliner and agreeable to PT. Pt performed sit<>stand transfer with SPC and CGA.Gait training in hall x 250f to day room with supervision assist-CGA for safety with cues for step width intermittently.   Dynamic standing balance on level surface and on airex pad while engaed in wii bowling. CGA on level surface due to mild R bias with self generated pertubation and min assist on airex pad for safety and improved use of ankle strategy to correct LOB    Nustep x 8.5 min level 4 with L UE splint. Cues for full ROM and maintenance of grasp within LUE hand splint.   Patient returned to room and performed stand pivot to [facility]iner with [facility] . Pt left sitting in recliner with call bell in reach and all needs met.        Therapy Documentation Precautions:  Precautions Precautions: Fall Precaution Comments: L hemi; emotional lability; vision impairments (needs further testing to determine extent) Restrictions Weight Bearing Restrictions: No    Vital Signs: Therapy Vitals Temp: 98.5 F (36.9 C) Pulse Rate: (!) 51 Resp: 18 BP: 122/81 Patient Position (if appropriate): Sitting Oxygen Therapy SpO2: 100 % O2 Device: Room Air Pain: Pain Assessment Pain Scale: 0-10 Pain Score: 0-No pain     Therapy/Group: Individual Therapy  ALorie Phenix8/19/2023, 5:11 PM

## 2021-09-21 NOTE — Progress Notes (Signed)
Physical Therapy Session Note  Patient Details  Name: James Wall MRN: [id number] Date of Birth: 15-Jun-1965  Today's Date: 09/21/2021 PT Individual Time: 1033-1150 PT Individual Time Calculation (min): 77 min   Short Term Goals: Week 1:  PT Short Term Goal 1 (Week 1): Pt will perform bed mobility with CGA. PT Short Term Goal 2 (Week 1): Pt will perform bed to chair transfer with CGA. PT Short Term Goal 3 (Week 1): Pt will ambulate 150' with CGA and LRAD. PT Short Term Goal 4 (Week 1): Pt will improved Berg by MCID. Week 2:     Skilled Therapeutic Interventions/Progress Updates:  Patient seated upright in recliner on phone with friend on entrance to room. Patient alert and agreeable to PT session. Following phone call, pt is emotionally labile requiring emotional support and therapeutic listening.   Patient with no pain complaint throughout session.  Therapeutic Activity: Transfers: Patient performed sit<>stand transfers with CGA/ supervision and stand pivot transfers throughout session with CGA. Provided verbal cues for technique and improving balance.  Gait Training/ NMR:  Patient ambulated through agility ladder with no AD and CGA throughout with few, intermittent light LOB requiring MinA to maintain balance. Progressed from stepping BLE into each square, to one step into each square, to high knee stepping, to toe touches to 6" cones placed alternately outside of ladder to L/R. With cues for conscious practice prior to performance for improved motor planning and coordination. Pt demos good single leg balance and is able to toe tap with RLE and sometimes stepping too hard with L and unable to step off target and knocking over cone.  Provided vc/ tc for technique to maintain balance throughout.  NMR performed for improvements in motor control and coordination, balance, sequencing, judgement, and self confidence/ efficacy in performing all aspects of mobility at highest level of  independence.   Patient seated upright in recliner at end of session with brakes locked, belt alarm set, and all needs within reach.   Therapy Documentation Precautions:  Precautions Precautions: Fall Precaution Comments: L hemi; emotional lability; vision impairments (needs further testing to determine extent) Restrictions Weight Bearing Restrictions: No General:   Vital Signs: Therapy Vitals Temp: 98.5 F (36.9 C) Pulse Rate: (!) 51 Resp: 18 BP: 122/81 Patient Position (if appropriate): Sitting Oxygen Therapy SpO2: 100 % O2 Device: Room Air Pain: Pain Assessment Pain Scale: 0-10 Pain Score: 0-No pain   Therapy/Group: Individual Therapy  Alger Simons PT, DPT, CSRS 09/21/2021, 1:53 PM

## 2021-09-21 NOTE — Progress Notes (Signed)
   09/21/21 1951 09/21/21 2115 09/21/21 2203  Vitals  Temp 98.1 F (36.7 C)  --   --   BP 134/72  --  (!) 147/79  MAP (mmHg) 89  --  98  BP Location Right Arm  --  Right Arm  BP Method Automatic  --  Automatic  Patient Position (if appropriate) Lying  --  Lying  Pulse Rate (!) 56 (!) 54 (!) 50  Pulse Rate Source Monitor  --  Monitor  Resp 14  --   --    Pt bradycardiac. Due Metoprolol 166m. Dr. SNaaman Plummernotified. MD ordered to give only Metoprolol 50 mg once. Metoprolol 1011mheld. Nursing care instructions placed.

## 2021-09-21 NOTE — Progress Notes (Signed)
Patients HD cath needs to be replaced. HD cath exposed. Dr. Cathleen Corti and Charge nurse notified. Patient will have to be seen by IR or OR to have new HD cath placed. No new orders at this time

## 2021-09-22 DIAGNOSIS — I10 Essential (primary) hypertension: Secondary | ICD-10-CM | POA: Diagnosis not present

## 2021-09-22 DIAGNOSIS — I63511 Cerebral infarction due to unspecified occlusion or stenosis of right middle cerebral artery: Secondary | ICD-10-CM | POA: Diagnosis not present

## 2021-09-22 DIAGNOSIS — Z992 Dependence on renal dialysis: Secondary | ICD-10-CM | POA: Diagnosis not present

## 2021-09-22 DIAGNOSIS — N186 End stage renal disease: Secondary | ICD-10-CM | POA: Diagnosis not present

## 2021-09-22 LAB — GLUCOSE, CAPILLARY: Glucose-Capillary: 95 mg/dL (ref 70–99)

## 2021-09-22 MED ORDER — CHLORHEXIDINE GLUCONATE CLOTH 2 % EX PADS: 6.0000 | MEDICATED_PAD | Freq: Every day | CUTANEOUS | Status: DC

## 2021-09-22 NOTE — Progress Notes (Signed)
PROGRESS NOTE   Subjective/Complaints:  Pt slept well. No new issues today. Denies pain  ROS: Patient denies fever, rash, sore throat, blurred vision, dizziness, nausea, vomiting, diarrhea, cough, shortness of breath or chest pain, joint or back/neck pain, headache, or mood change.   Objective:   No results found.  Recent Labs    09/20/21 1453  WBC 3.6*  HGB 8.0*  HCT 23.2*  PLT 139*   Recent Labs    09/20/21 1453  NA 140  K 3.9  CL 102  CO2 29  GLUCOSE 101*  BUN 32*  CREATININE 10.02*  CALCIUM 10.3    Intake/Output Summary (Last 24 hours) at 09/22/2021 0834 Last data filed at 09/22/2021 0729 Gross per 24 hour  Intake 660 ml  Output --  Net 660 ml        Physical Exam: Vital Signs Blood pressure (!) 142/81, pulse (!) 56, temperature 98.2 F (36.8 C), resp. rate 18, height 5' 5"  (1.651 m), weight 83.7 kg, SpO2 99 %.   Constitutional: No distress . Vital signs reviewed. HEENT: NCAT, EOMI, oral membranes moist Neck: supple Cardiovascular: RRR without murmur. No JVD    Respiratory/Chest: CTA Bilaterally without wheezes or rales. Normal effort    GI/Abdomen: BS +, non-tender, non-distended Ext: no clubbing, cyanosis, or edema Psych: pleasant and cooperative  Skin: No evidence of breakdown, no evidence of rash, HD cath site without erythema, clean, covered  Neurologic: Cranial nerves II through XII intact, motor strength is 5/5 in right  deltoid, bicep, tricep, grip, hip flexor, knee extensors, ankle dorsiflexor and plantar flexor 3- LUE, 2- LLE Sensory exam normal sensation to light touch and proprioception in bilateral upper and lower extremities  Musculoskeletal: Full range of motion in all 4 extremities. No joint swelling   Assessment/Plan: 1. Functional deficits which require 3+ hours per day of interdisciplinary therapy in a comprehensive inpatient rehab setting. Physiatrist is providing close  team supervision and 24 hour management of active medical problems listed below. Physiatrist and rehab team continue to assess barriers to discharge/monitor patient progress toward functional and medical goals  Care Tool:  Bathing    Body parts bathed by patient: Left arm, Chest, Abdomen, Front perineal area, Right upper leg, Left upper leg, Face   Body parts bathed by helper: Buttocks, Right arm, Right lower leg, Left lower leg     Bathing assist Assist Level: Moderate Assistance - Patient 50 - 74%     Upper Body Dressing/Undressing Upper body dressing   What is the patient wearing?: Pull over shirt    Upper body assist Assist Level: Contact Guard/Touching assist    Lower Body Dressing/Undressing Lower body dressing      What is the patient wearing?: Pants     Lower body assist Assist for lower body dressing: Moderate Assistance - Patient 50 - 74%     Toileting Toileting Toileting Activity did not occur (Clothing management and hygiene only): N/A (no void or bm)  Toileting assist Assist for toileting: Moderate Assistance - Patient 50 - 74%     Transfers Chair/bed transfer  Transfers assist  Chair/bed transfer activity did not occur: Safety/medical concerns  Chair/bed transfer assist level:  Minimal Assistance - Patient > 75%     Locomotion Ambulation   Ambulation assist      Assist level: Minimal Assistance - Patient > 75% Assistive device: No Device Max distance: 150'   Walk 10 feet activity   Assist     Assist level: Minimal Assistance - Patient > 75% Assistive device: No Device   Walk 50 feet activity   Assist    Assist level: Minimal Assistance - Patient > 75% Assistive device: No Device    Walk 150 feet activity   Assist    Assist level: Minimal Assistance - Patient > 75% Assistive device: No Device    Walk 10 feet on uneven surface  activity   Assist     Assist level: Minimal Assistance - Patient > 75% Assistive  device:  (handrail)   Wheelchair     Assist Is the patient using a wheelchair?: No             Wheelchair 50 feet with 2 turns activity    Assist            Wheelchair 150 feet activity     Assist          Blood pressure (!) 142/81, pulse (!) 56, temperature 98.2 F (36.8 C), resp. rate 18, height 5' 5"  (1.651 m), weight 83.7 kg, SpO2 99 %.  Medical Problem List and Plan: 1. 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  Recommendations of 30-day monitor             -patient may shower             -ELOS/Goals: 10/01/2021 supervision goals  -Continue CIR therapies including PT, OT, and SLP   2.  Antithrombotics: -DVT/anticoagulation:  Mechanical: Antiembolism stockings, thigh (TED hose) Bilateral lower extremities             -antiplatelet therapy: Plavix 75 mg daily only.  DAPT held due to chronic anemia 3. Pain Management: N/A 4. Mood/Behavior/Sleep: Provide emotional support             -antipsychotic agents: N/A 5. Neuropsych/cognition: This patient is capable of making decisions on his own behalf. 6. Skin/Wound Care: Routine skin checks 7. Fluids/Electrolytes/Nutrition: Routine in and outs with follow-up chemistries 8.  Hypertension.  Norvasc 10 mg daily, clonidine patch 0.3 mg weekly, hydralazine 25 mg twice daily, Isordil 20 mg daily, Lopressor 100 mg twice daily.   Vitals:   09/21/21 2203 09/22/21 0439  BP: (!) 147/79 (!) 142/81  Pulse: (!) 50 (!) 56  Resp:  18  Temp:  98.2 F (36.8 C)  SpO2: 97% 99%    BP fair to good,  management per nephrology   9.  End-stage renal disease.  Hemodialysis as directed per renal services             -HD at end of day to allow participation in therapies  -HD catheter not sutured/partially moved. Nephrology aware. They will address Monday   10.  Hyperlipidemia.  Crestor 11.  Nonalcoholic cirrhosis with ascites.  Paracentesis 2014-2015. 12.  History of prostate  cancer.  Followed outpatient by Dr. Gloriann Loan and recently completed radiation therapy.  Continue Flomax 13.  History of first-degree AV block.  No chest pain or shortness of breath 14.  Tobacco abuse.  Counseling 15.  COPD.  OSA.  Patient no longer on CPAP. 16.  Chronic anemia.    IV iron weekly with ferric per hemodialysis, transfuse if <  7.0 , consider  if pt becomes orthostatic or tachy in therapy .  Hgb improved yesterday , may be r/t fluid status   17.  Hx of GERD, took a tablet at home for this will start PPI 18.  Mild thrombocytopenia, no bleeding monitor plt count no sq heparin for DVT prophyllaxis   LOS: 6 days A FACE TO FACE EVALUATION WAS PERFORMED  Meredith Staggers 09/22/2021, 8:34 AM

## 2021-09-22 NOTE — Progress Notes (Signed)
Speech Language Pathology Daily Session Note  Patient Details  Name: James Wall MRN: [id number] Date of Birth: 09-25-1965  Today's Date: 09/22/2021 SLP Individual Time: 0730-0815 SLP Individual Time Calculation (min): 45 min  Short Term Goals: Week 1: SLP Short Term Goal 1 (Week 1): Pt will utilize speech intelligiblity strategies at the conversational level with mod I SLP Short Term Goal 2 (Week 1): Patient will complete mildly complex problem solving tasks with min-to-mod A verbal/visual cues SLP Short Term Goal 3 (Week 1): Patient will utilize external memory strategies to recall novel information with min A SLP Short Term Goal 4 (Week 1): Patient will demonstrate awareness to errors during functional cognitive tasks with min A  Skilled Therapeutic Interventions:  Pt was seen for skilled ST targeting cognitive goals.  Upon arrival, pt was in bed, awake, alert, and agreeable to participating in treatment.  Pt requested to don a clean shirt and was able to reorient shirt from inside out with increased time.  Pt then ambulated to sink and brushed his teeth with mod I for functional problem solving and safety awareness.  Pt was able to do all this while simultaneously conversing with therapist.   Pt was verbose but aware and apologized multiple times for "talking too much."  Pt was left in recliner per request and could verbalize need for use of seatbelt alarm.  He attempted to set seat belt alarm himself but needed some assistance due to being unfamiliar with how various straps aligned.  Seat belt alarm was activated and call bell left within reach.  Continue per current plan of care.    Pain Pain Assessment Pain Scale: 0-10 Pain Score: 0-No pain  Therapy/Group: Individual Therapy  Mylee Falin, Selinda Orion 09/22/2021, 12:15 PM

## 2021-09-22 NOTE — Progress Notes (Signed)
  [facility] Progress Note   Subjective: pt seen in room, feels much better and getting more use of the L hand and arm   Objective:   BP (!) 142/81 (BP Location: Right Arm)   Pulse (!) 56   Temp 98.2 F (36.8 C)   Resp 18   Ht 5' 5"  (1.651 m)   Wt 83.7 kg   SpO2 99%   BMI 30.71 kg/m   Physical Exam: Gen:NAD CVS:RRR Resp:normal WOB, CTA BL YYT:KPTW Ext: no sig edema b/l LEs Neuro: left sided weakness noted, awake/alert Dialysis access: RIJ TDC  OP HD orders: [place] MWF 4h  400/800  85kg  2/2 bath  P2  Heparin 4500 then [id number] prn  RIJ TDC - sodium ferric gluconate 125 mg weekly IV, last 8/11 - doxercalciferol 4 ug IV tiw - last HD 8/11, post wt 85.1kg - last Hb 8.4 on 8/9, ferritin 318 on 7/19 - hep B labs done 8/16, non-immune   Assessment / Plan  Subacute CVA -symptom onset 8/11, out of window for intervention. 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  Anemia of CKD -not on ESAs due to prostate Ca -receiving Fe load (venofer allergy but has been tolerating Fe as an outpatient chronically and here as well) -transfuse prn for hgb <7  Prostate Ca -just completed XRT as an outpatient  RKelly Splinter MD 09/22/2021, 12:30 PM    Subjective:      Recent Labs  Lab 09/18/21 0808 09/20/21 1453  HGB 8.2* 8.0*  ALBUMIN 3.3* 3.2*  CALCIUM 10.4* 10.3  PHOS 5.0* 4.8*  CREATININE 9.70* 10.02*  K 4.2 3.9   Inpatient medications:  amLODipine  10 mg Oral QHS   Chlorhexidine Gluconate Cloth  6 each Topical Q12H   cloNIDine  0.3 mg Transdermal Q Sun   clopidogrel  75 mg Oral Daily   hydrALAZINE  25 mg Oral BID   isosorbide dinitrate  20 mg Oral BID   metoprolol tartrate  100 mg Oral BID   pantoprazole  20 mg Oral Daily   rosuvastatin  10 mg Oral Daily   sevelamer carbonate  3,200 mg Oral TID WC    tamsulosin  0.4 mg Oral QPC supper   umeclidinium-vilanterol  1 puff Inhalation Daily    ferric gluconate (FERRLECIT) IVPB Stopped (09/20/21 1845)   sodium chloride flush

## 2021-09-22 NOTE — Plan of Care (Signed)
Pt alert and oriented. Med compliant. MD contacted during overnight due to bradycardia. Metoprolol given as one x dose of 57m due to heart rate. 100 mg dose held. Patient denies pain. Other vitals stable.  Problem: Education: Goal: Knowledge of General Education information will improve Description: Including pain rating scale, medication(s)/side effects and non-pharmacologic comfort measures Outcome: Progressing   Problem: Health Behavior/Discharge Planning: Goal: Ability to manage health-related needs will improve Outcome: Progressing   Problem: Clinical Measurements: Goal: Ability to maintain clinical measurements within normal limits will improve Outcome: Progressing Goal: Will remain free from infection Outcome: Progressing Goal: Diagnostic test results will improve Outcome: Progressing Goal: Respiratory complications will improve Outcome: Progressing Goal: Cardiovascular complication will be avoided Outcome: Progressing   Problem: Coping: Goal: Level of anxiety will decrease Outcome: Progressing   Problem: Pain Managment: Goal: General experience of comfort will improve Outcome: Progressing   Problem: Safety: Goal: Ability to remain free from injury will improve Outcome: Progressing

## 2021-09-23 ENCOUNTER — Inpatient Hospital Stay (HOSPITAL_COMMUNITY): Payer: Medicare Other

## 2021-09-23 DIAGNOSIS — I639 Cerebral infarction, unspecified: Secondary | ICD-10-CM | POA: Diagnosis not present

## 2021-09-23 LAB — CBC
HCT: 23.4 % — ABNORMAL LOW (ref 39.0–52.0)
Hemoglobin: 7.7 g/dL — ABNORMAL LOW (ref 13.0–17.0)
MCH: 34.2 pg — ABNORMAL HIGH (ref 26.0–34.0)
MCHC: 32.9 g/dL (ref 30.0–36.0)
MCV: 104 fL — ABNORMAL HIGH (ref 80.0–100.0)
Platelets: 166 10*3/uL (ref 150–400)
RBC: 2.25 MIL/uL — ABNORMAL LOW (ref 4.22–5.81)
RDW: 14 % (ref 11.5–15.5)
WBC: 3.4 10*3/uL — ABNORMAL LOW (ref 4.0–10.5)
nRBC: 0 % (ref 0.0–0.2)

## 2021-09-23 LAB — RENAL FUNCTION PANEL
Albumin: 3.5 g/dL (ref 3.5–5.0)
Anion gap: 12 (ref 5–15)
BUN: 49 mg/dL — ABNORMAL HIGH (ref 6–20)
CO2: 29 mmol/L (ref 22–32)
Calcium: 10.6 mg/dL — ABNORMAL HIGH (ref 8.9–10.3)
Chloride: 96 mmol/L — ABNORMAL LOW (ref 98–111)
Creatinine, Ser: 12.78 mg/dL — ABNORMAL HIGH (ref 0.61–1.24)
GFR, Estimated: 4 mL/min — ABNORMAL LOW (ref >60)
Glucose, Bld: 104 mg/dL — ABNORMAL HIGH (ref 70–99)
Phosphorus: 5.8 mg/dL — ABNORMAL HIGH (ref 2.5–4.6)
Potassium: 4.6 mmol/L (ref 3.5–5.1)
Sodium: 137 mmol/L (ref 135–145)

## 2021-09-23 MED ORDER — HEPARIN SODIUM (PORCINE) 1000 UNIT/ML IJ SOLN
INTRAMUSCULAR | Status: AC
  Administered 2021-09-23: 1000 [IU]
  Filled 2021-09-23: qty 4

## 2021-09-23 MED ORDER — HEPARIN SODIUM (PORCINE) 1000 UNIT/ML IJ SOLN
INTRAMUSCULAR | Status: AC
  Administered 2021-09-23: 2000 [IU]
  Filled 2021-09-23: qty 2

## 2021-09-23 MED ORDER — HEPARIN SODIUM (PORCINE) 1000 UNIT/ML DIALYSIS
2000.0000 [IU] | INTRAMUSCULAR | Status: DC | PRN
Start: 2021-09-24 — End: 2021-09-23
  Filled 2021-09-23 (×2): qty 2

## 2021-09-23 NOTE — Progress Notes (Signed)
Speech Language Pathology Daily Session Note  Patient Details  Name: James Wall MRN: [id number] Date of Birth: [birth year]  Today's Date: 09/23/2021 SLP Individual Time: 0800-0855 SLP Individual Time Calculation (min): 55 min and Today's Date: 09/23/2021 SLP Missed Time: 5 Minutes Missed Time Reason: X-ray  Short Term Goals: Week 1: SLP Short Term Goal 1 (Week 1): Pt will utilize speech intelligiblity strategies at the conversational level with mod I SLP Short Term Goal 2 (Week 1): Patient will complete mildly complex problem solving tasks with min-to-mod A verbal/visual cues SLP Short Term Goal 3 (Week 1): Patient will utilize external memory strategies to recall novel information with min A SLP Short Term Goal 4 (Week 1): Patient will demonstrate awareness to errors during functional cognitive tasks with min A  Skilled Therapeutic Interventions: Skilled ST treatment focused on cognitive goals. Pt was received EOB on arrival and requesting to brush teeth at sink. Pt ambulated to sink using cane and CGA. Pt performed oral care with set-up A and then ambulated to wheelchair for remainder of session and spent session outside in courtyard area per pt's request. SLP facilitated money and time management scenarios with "solving daily math problems" subtest via the ALFA. Pt obtained 8/10 accuracy at IND level progressing to 10/10 with sup A verbal cues for error repair. Pt also engaged in verbal problem solving regarding home and community safety scenarios with mod I. Of note, pt missed 5 minutes of skilled ST intervention due to xray. Patient transferred to recliner at end of session with sup A verbal cue to utilize cane. Pt left with alarm activated and immediate needs within reach at end of session. Continue per current plan of care.      Pain  None/denied  Therapy/Group: Individual Therapy  Patty Sermons 09/23/2021, 8:28 AM

## 2021-09-23 NOTE — Progress Notes (Signed)
Just like before ,patient signed out an early termination on his last hour of his HD treatment.MD on call notified.HD cath dressed securely .

## 2021-09-23 NOTE — Progress Notes (Signed)
PROGRESS NOTE   Subjective/Complaints:  Appreciate renal note, HD cath reportedly dislodged but no bleeding still contained under occlusive dressing   ROS: Patient denies CP, SOB, N/V/D  Objective:   No results found.  Recent Labs    09/20/21 1453  WBC 3.6*  HGB 8.0*  HCT 23.2*  PLT 139*    Recent Labs    09/20/21 1453  NA 140  K 3.9  CL 102  CO2 29  GLUCOSE 101*  BUN 32*  CREATININE 10.02*  CALCIUM 10.3     Intake/Output Summary (Last 24 hours) at 09/23/2021 0757 Last data filed at 09/22/2021 1833 Gross per 24 hour  Intake 480 ml  Output --  Net 480 ml         Physical Exam: Vital Signs Blood pressure 134/67, pulse (!) 51, temperature 97.9 F (36.6 C), temperature source Oral, resp. rate 16, height 5' 5"  (1.651 m), weight 83.7 kg, SpO2 99 %.   General: No acute distress Mood and affect are appropriate Heart: Regular rate and rhythm no rubs murmurs or extra sounds Lungs: Clear to auscultation, breathing unlabored, no rales or wheezes Abdomen: Positive bowel sounds, soft nontender to palpation, nondistended Extremities: No clubbing, cyanosis, or edema  Skin: No evidence of breakdown, no evidence of rash, HD cath site without erythema, clean, covered  Neurologic: Cranial nerves II through XII intact, motor strength is 5/5 in right  deltoid, bicep, tricep, grip, hip flexor, knee extensors, ankle dorsiflexor and plantar flexor 3- LUE, 2- LLE Sensory exam normal sensation to light touch and proprioception in bilateral upper and lower extremities  Musculoskeletal: Full range of motion in all 4 extremities. No joint swelling   Assessment/Plan: 1. Functional deficits which require 3+ hours per day of interdisciplinary therapy in a comprehensive inpatient rehab setting. Physiatrist is providing close team supervision and 24 hour management of active medical problems listed below. Physiatrist and rehab  team continue to assess barriers to discharge/monitor patient progress toward functional and medical goals  Care Tool:  Bathing    Body parts bathed by patient: Left arm, Chest, Abdomen, Front perineal area, Right upper leg, Left upper leg, Face   Body parts bathed by helper: Buttocks, Right arm, Right lower leg, Left lower leg     Bathing assist Assist Level: Moderate Assistance - Patient 50 - 74%     Upper Body Dressing/Undressing Upper body dressing   What is the patient wearing?: Pull over shirt    Upper body assist Assist Level: Contact Guard/Touching assist    Lower Body Dressing/Undressing Lower body dressing      What is the patient wearing?: Pants     Lower body assist Assist for lower body dressing: Moderate Assistance - Patient 50 - 74%     Toileting Toileting Toileting Activity did not occur (Clothing management and hygiene only): N/A (no void or bm)  Toileting assist Assist for toileting: Moderate Assistance - Patient 50 - 74%     Transfers Chair/bed transfer  Transfers assist  Chair/bed transfer activity did not occur: Safety/medical concerns  Chair/bed transfer assist level: Minimal Assistance - Patient > 75%     Locomotion Ambulation   Ambulation assist  Assist level: Minimal Assistance - Patient > 75% Assistive device: No Device Max distance: 150'   Walk 10 feet activity   Assist     Assist level: Minimal Assistance - Patient > 75% Assistive device: No Device   Walk 50 feet activity   Assist    Assist level: Minimal Assistance - Patient > 75% Assistive device: No Device    Walk 150 feet activity   Assist    Assist level: Minimal Assistance - Patient > 75% Assistive device: No Device    Walk 10 feet on uneven surface  activity   Assist     Assist level: Minimal Assistance - Patient > 75% Assistive device:  (handrail)   Wheelchair     Assist Is the patient using a wheelchair?: No              Wheelchair 50 feet with 2 turns activity    Assist            Wheelchair 150 feet activity     Assist          Blood pressure 134/67, pulse (!) 51, temperature 97.9 F (36.6 C), temperature source Oral, resp. rate 16, height 5' 5"  (1.651 m), weight 83.7 kg, SpO2 99 %.  Medical Problem List and Plan: 1. 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  Recommendations of 30-day monitor             -patient may shower             -ELOS/Goals: 10/01/2021 supervision goals  -Continue CIR therapies including PT, OT, and SLP   2.  Antithrombotics: -DVT/anticoagulation:  Mechanical: Antiembolism stockings, thigh (TED hose) Bilateral lower extremities             -antiplatelet therapy: Plavix 75 mg daily only.  DAPT held due to chronic anemia 3. Pain Management: N/A 4. Mood/Behavior/Sleep: Provide emotional support             -antipsychotic agents: N/A 5. Neuropsych/cognition: This patient is capable of making decisions on his own behalf. 6. Skin/Wound Care: Routine skin checks 7. Fluids/Electrolytes/Nutrition: Routine in and outs with follow-up chemistries 8.  Hypertension.  Norvasc 10 mg daily, clonidine patch 0.3 mg weekly, hydralazine 25 mg twice daily, Isordil 20 mg daily, Lopressor 100 mg twice daily.   Vitals:   09/22/21 2312 09/23/21 0319  BP: (!) 144/83 134/67  Pulse:  (!) 51  Resp:  16  Temp:  97.9 F (36.6 C)  SpO2:  99%    BP fair to good,  management per nephrology   9.  End-stage renal disease.  Hemodialysis as directed per renal services             -HD at end of day to allow participation in therapies  -HD catheter not sutured/partially moved. Nephrology aware. They will address Monday   10.  Hyperlipidemia.  Crestor 11.  Nonalcoholic cirrhosis with ascites.  Paracentesis 2014-2015. 12.  History of prostate cancer.  Followed outpatient by Dr. Gloriann Loan and recently completed radiation therapy.  Continue  Flomax 13.  History of first-degree AV block.  No chest pain or shortness of breath 14.  Tobacco abuse.  Counseling 15.  COPD.  OSA.  Patient no longer on CPAP. 16.  Chronic anemia.    IV iron weekly with ferric per hemodialysis, transfuse if < 7.0 , consider  if pt becomes orthostatic or tachy in therapy .  Hgb improved yesterday , may be r/t  fluid status   17.  Hx of GERD, took a tablet at home for this will start PPI 18.  Mild thrombocytopenia, no bleeding monitor plt count no sq heparin for DVT prophyllaxis   LOS: 7 days A FACE TO FACE EVALUATION WAS PERFORMED  Charlett Blake 09/23/2021, 7:57 AM

## 2021-09-23 NOTE — Progress Notes (Signed)
Occupational Therapy Session Note  Patient Details  Name: James Wall MRN: [id number] Date of Birth: February 22, 1965  Today's Date: 09/23/2021 OT Individual Time: 7129-2909 OT Individual Time Calculation (min): 65 min    Short Term Goals: Week 1:  OT Short Term Goal 1 (Week 1): Pt will complete grooming standing at the sink with CGA for safety. OT Short Term Goal 2 (Week 1): Pt will complete UB dressing with supervision using hemi-techniques while seated. OT Short Term Goal 3 (Week 1): Pt will complete LB dressing with CGA for safety using hemi-techniques. OT Short Term Goal 4 (Week 1): Pt will complete toileting and toilet transfer with CGA for safety with LRAD as needed. OT Short Term Goal 5 (Week 1): Pt will complete tub-shower transfer using a tub-bench with CGA for safety and LRAD as needed.  Skilled Therapeutic Interventions/Progress Updates:  pt greeted seated in recliner, agreeable to OT intervention, ADL needs had been met.  Pt completed functional ambulation from room to gym with SPC and CGA. Pt completed below therapeutic activities in an effort to work on LUE coordination:  - instructed pt to use spatula to flip bean bags and small discs to simulate  IADL task of cooking to facilitate improved wrist pronation/supination and improved  grasp with L digits. Pt completed task with + time but overall S, pt surprised that he was able to grasp spatula. Education provided on using coband as needed to build up utensils as needed at home, issued pt coband for DC.  - simulated cutting of veggies task with pt instructed to use butter knife to cut theraputty into small pieces with LUE stabilizing putty and RUE using knife, pt completed task with supervision with + time and effort  - worked on dynamic standing balance with pt able to stand with CGA and stand to fold wash cloths with an emphasis on bilateral integration, L attention and LUE FMC, pt completed task with supervision + effort to  incorporate LUE into task -work on functional grasp with LUE with pt instrucetd to stand at table and grasp wash cloth with an emphasis on composite digit flexion/extension, pt completed task with supervision but needed + time to position L hand flat on wash cloth and often needed assist from R hand to position L hand flat   Pt completed functional ambulation back to room with SPC and CGA.  Issued pt cup with slit to work on placing compliant cubes through slit to challenge tripod grasp and add resistance to [facility] task. Pt left seated in recliner with all needs within reach.     Therapy Documentation Precautions:  Precautions Precautions: Fall Precaution Comments: L hemi; emotional lability; vision impairments (needs further testing to determine extent) Restrictions Weight Bearing Restrictions: No  Pain: No pain reported during session    Therapy/Group: Individual Therapy  Precious Haws 09/23/2021, 1:22 PM

## 2021-09-23 NOTE — Progress Notes (Addendum)
Renal MD at bedside to assess patient's cuffless HD catheter.

## 2021-09-23 NOTE — Consult Note (Addendum)
Hospital Consult    Reason for Consult:  [facility] issues Requesting Physician:  Dr. Jonnie Finner MRN #:  [id number]  History of Present Illness: This is a 56 y.o. male with past medical history significant for end-stage renal disease on hemodialysis.  He is being seen in consultation for evaluation for problems with his right IJ TDC.  This was placed by [facility] about a month ago.  The sutures have fallen out from the [facility] and now the cuff is exposed.  TDC continues to function properly however there is concern for [facility] coming all the way out.  He is known to the vascular service having had multiple interventions on bilateral upper extremities for permanent access.  He has not had any thigh AV graft however is adamantly against any further surgery for dialysis access.  He is however willing to undergo catheter exchanged during hospitalization.  He is on Plavix daily.  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 [facility])    primary cancer is prostate   Anemia associated with chronic renal failure    Anxiety    Arthritis    Cirrhosis, nonalcoholic ([place])    per pt possible from a medication , unsure ;   last ultrasound 08-09-2020 in epic no fibrosis   COPD (chronic obstructive pulmonary disease) ([place])    Depression    ESRD on hemodialysis ([place]) 2009   Started HD Jan 2009;  ESRD secondary to hypertensive nephrosclerosis;  dialysis at [facility] on MWF   First degree heart block    GERD (gastroesophageal reflux disease)    Hiatal hernia    History of acute respiratory failure 07/2013   admission;  HCAP w/ ARF with hypoxia   History of adenomatous polyp of colon    History of ascites    s/p paracentesis 01-31-2013 (5L) and last one 03-28-2013 (2.7L)   History of community acquired pneumonia 08/08/2020   admission ; LLL , POA   History of heart murmur in childhood    History of MRSA infection 12/19/2012   hospital  admission;  w/ sepsis MRSA bacteramia AVF infection   History of sepsis 03/2017   admission;   HCAP w/ sepsis   Hyperlipidemia    Hypertension    Hypertensive heart disease    cardiologist--- dr Osborne Oman;  nuclear stress test 06-16-2013 intermediate risk w/ mid-distal anterior wall ischemia , not gated;  cardiac cath 07-13-2013 in epic showed normal coronaries and LVSF,  ef not assessed, LCEDP 73mHg   Hypertensive nephrosclerosis, 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 ([place]    Intolerance to cold    due to anemia   Malignant neoplasm prostate ([facility]    urologist--- dr bell/  radiation onologist--- dr mTammi Klippel  dx 01/ 2023,  Gleason 4+3, PSA 382  NICM (nonischemic cardiomyopathy) ([facility]    followed by cardiology;   last echo in epic 09-13-2020 ef 55-60%   OSA (obstructive sleep apnea) 2009   no  longer using cpap since the yr started 2009;   sleep study in epic 05-11-2007 severe osa   PSVT (paroxysmal supraventricular tachycardia) ([place]    event monitor 08-01-2019  SR w/ SVT runs , rare PAC/ PVC   Secondary hyperparathyroidism ([place]    Wears glasses     Past Surgical History:  Procedure Laterality Date   AV FISTULA PLACEMENT Right 02/22/2013   Procedure:  CREATION  OF BRACHIAL CEPHALIC FISTULA  RIGHT ARM;  Surgeon: Elam Dutch, MD;  Location: [place];  Service: Vascular;  Laterality: Right;   AV FISTULA PLACEMENT Left 08/10/2014   Procedure: BASILIC VEIN TRANSPOSITION  ARTERIOVENOUS (AV) FISTULA CREATION LEFT UPPER ARM;  Surgeon: Mal Misty, MD;  Location: [place];  Service: Vascular;  Laterality: Left;   AV FISTULA PLACEMENT, RADIOCEPHALIC Left 76/22/6333   @[facility] ;  Left lower arm   COLONOSCOPY  11/30/2018   by dr Tarri Glenn   ESOPHAGOGASTRODUODENOSCOPY (EGD) WITH PROPOFOL N/A 04/12/2013   Procedure: ESOPHAGOGASTRODUODENOSCOPY (EGD) WITH PROPOFOL;  Surgeon: Arta Silence, MD;  [facility]: [facility];  Service: Endoscopy;  Laterality: N/A;   GOLD SEED  IMPLANT N/A 06/11/2021   Procedure: GOLD SEED IMPLANT;  Surgeon: Janith Lima, MD;  [facility];  Service: Urology;  Laterality: N/A;   INSERTION OF DIALYSIS CATHETER N/A 12/23/2012   Procedure: INSERTION OF DIALYSIS CATHETER; ULTRASOUND GUIDED;  Surgeon: Angelia Mould, MD;  Location: [place];  Service: Vascular;  Laterality: N/A;   INSERTION OF DIALYSIS CATHETER  10/22/2015   Right IJ non-tunneled HD catheter, placed again in 2019   IR FLUORO GUIDE CV LINE RIGHT  08/18/2019   IR FLUORO GUIDE CV LINE RIGHT  10/07/2019   IR GENERIC HISTORICAL  10/22/2015   IR US GUIDE VASC ACCESS RIGHT 10/22/2015 [facility]   IR GENERIC HISTORICAL  10/22/2015   IR FLUORO GUIDE CV LINE RIGHT 10/22/2015 [facility]   IR GENERIC HISTORICAL  10/23/2015   IR FLUORO GUIDE CV LINE RIGHT 10/23/2015 Marybelle Killings, MD [facility]   LEFT HEART CATHETERIZATION WITH CORONARY ANGIOGRAM N/A 07/13/2013   Procedure: LEFT HEART CATHETERIZATION WITH CORONARY ANGIOGRAM;  Surgeon: Jettie Booze, MD;  Location: [facility] CATH LAB;  Service: Cardiovascular;  Laterality: N/A;   LIGATION OF ARTERIOVENOUS  FISTULA Left 12/22/2012   Procedure: LIGATION OF ARTERIOVENOUS  FISTULA;EXCISION OF LARGE ANEURYSMS;;  Surgeon: Elam Dutch, MD;  Location: [place];  Service: Vascular;  Laterality: Left;   SPACE OAR INSTILLATION N/A 06/11/2021   Procedure: SPACE OAR INSTILLATION;  Surgeon: Janith Lima, MD;  [facility];  Service: Urology;  Laterality: N/A;   UPPER GASTROINTESTINAL ENDOSCOPY  09/07/2018   by dr Tarri Glenn    Allergies  Allergen Reactions   Venofer  [Ferric Oxide] Itching   Aspirin Other (See Comments)    Avoids due to renal disease;  Made congestion worse     Prior to Admission medications   Medication Sig Start Date End Date Taking? Authorizing Provider  acetaminophen (TYLENOL) 500 MG tablet Take 500 mg by mouth every 6 (six) hours as needed.    [provider]  amLODipine (NORVASC) 10 MG tablet  TAKE 1 TABLET(10 MG) BY MOUTH DAILY Patient taking differently: Take 10 mg by mouth at bedtime. 04/26/18   Allred, Jeneen Rinks, MD  cloNIDine (CATAPRES - DOSED IN MG/24 HR) 0.3 mg/24hr patch Place 0.3 mg onto the skin once a week. Apply on Sundays 12/29/18   [provider]  clopidogrel (PLAVIX) 75 MG tablet Take 1 tablet (75 mg total) by mouth daily. 09/16/21   Sharion Settler, DO  famotidine (PEPCID) 10 MG tablet Take 1 tablet (10 mg total) by mouth daily as needed for heartburn. 09/16/21   Sharion Settler, DO  ferric gluconate 250 mg in sodium chloride 0.9 % 250 mL Inject 250 mg into the vein every Monday, Wednesday, and Friday with hemodialysis. 09/16/21   Sharion Settler, DO  hydrALAZINE (APRESOLINE) 25 MG tablet Take 1  tablet (25 mg total) by mouth 2 (two) times daily. Patient taking differently: Take 25 mg by mouth 2 (two) times daily. 09/13/20   Werner Lean, MD  isosorbide dinitrate (ISORDIL) 20 MG tablet Take 1 tablet (20 mg total) by mouth 2 (two) times daily. 09/16/21   Sharion Settler, DO  metoprolol tartrate (LOPRESSOR) 100 MG tablet Take 1 tablet (100 mg total) by mouth 2 (two) times daily. 09/16/21   Sharion Settler, DO  rosuvastatin (CRESTOR) 10 MG tablet Take 1 tablet (10 mg total) by mouth daily. 09/16/21   Sharion Settler, DO  sevelamer carbonate (RENVELA) 800 MG tablet Take 1,600-3,200 mg by mouth See admin instructions. Take 2-4 tablets by mouth daily  with meals depending on meal size per patient    [provider]  tamsulosin (FLOMAX) 0.4 MG CAPS capsule TAKE 2 CAPSULES(0.8 MG) BY MOUTH DAILY Patient taking differently: Take 0.4 mg by mouth daily after supper. 09/06/20   Cresenzo, Angelyn Punt, MD  umeclidinium-vilanterol (ANORO ELLIPTA) 62.5-25 MCG/INH AEPB Inhale 1 puff into the lungs daily. Patient taking differently: Inhale 1 puff into the lungs daily as needed (For shortness of breath). 09/20/19   Autry-Lott, Naaman Plummer, DO    Social History    Socioeconomic History   Marital status: Single    Spouse name: Not on file   Number of children: 0   Years of education: 12th   Highest education level: High school graduate  Occupational History   Occupation: n/a  Tobacco Use   Smoking status: Every Day    Packs/day: 0.50    Years: 17.00    Total pack years: 8.50    Types: Cigarettes   Smokeless tobacco: Never   Tobacco comments:    06-06-2021  per pt had quit smoking 06/ 2021 for 16 yrs , but restarted approx 10/ 2022 1pp 3-4 days  Vaping Use   Vaping Use: Never used  Substance and Sexual Activity   Alcohol use: Yes    Comment: drinks liquor occ   Drug use: Yes    Frequency: 4.0 times per week    Types: Marijuana    Comment: 06-06-2021 per pt smokes 2-4 weekly, (half of joint "blunt")   Sexual activity: Not Currently  Other Topics Concern   Not on file  Social History Narrative   Patient lives alone in [place].    Patient has never been married and does not have any children.    Patients support system, his mother, pass away 2016.   Patient does not own his own vehicle, uses public transportation with no concerns at this time.   Social Determinants of Health   Financial Resource Strain: Low Risk  (08/01/2020)   Overall Financial Resource Strain (CARDIA)    Difficulty of Paying Living Expenses: Not very hard  Food Insecurity: No Food Insecurity (08/01/2020)   Hunger Vital Sign    Worried About Running Out of Food in the Last Year: Never true    Ran Out of Food in the Last Year: Never true  Transportation Needs: Unmet Transportation Needs (08/27/2021)   PRAPARE - Transportation    Lack of Transportation (Medical): Yes    Lack of Transportation (Non-Medical): Yes  Physical Activity: Inactive (08/01/2020)   Exercise Vital Sign    Days of Exercise per Week: 0 days    Minutes of Exercise per Session: 0 min  Stress: No Stress Concern Present (08/01/2020)   [place]    Feeling of  Stress : Only a little  Social Connections: Socially Isolated (08/01/2020)   Social Connection and Isolation Panel [NHANES]    Frequency of Communication with Friends and Family: Twice a week    Frequency of Social Gatherings with Friends and Family: Twice a week    Attends Religious Services: Never    Marine scientist or Organizations: No    Attends Archivist Meetings: Never    Marital Status: Never married  Intimate Partner Violence: Not At Risk (08/01/2020)   Humiliation, Afraid, Rape, and Kick questionnaire    Fear of Current or Ex-Partner: No    Emotionally Abused: No    Physically Abused: No    Sexually Abused: No     Family History  Problem Relation Age of Onset   Hypertension Mother    Cerebrovascular Accident Father    Hypertension Father    Congestive Heart Failure Brother    Asthma Brother    Stomach cancer Neg Hx    Rectal cancer Neg Hx    Esophageal cancer Neg Hx    Colon cancer Neg Hx     ROS: Otherwise negative unless mentioned in HPI  Physical Examination  Vitals:   09/23/21 1530 09/23/21 1536  BP: 139/77 (!) 144/64  Pulse: (!) 49 (!) 50  Resp: 10 14  Temp: (!) 97.5 F (36.4 C)   SpO2: 98% 99%   Body mass index is 30.3 kg/m.  General:  WDWN in NAD Gait: Not observed HENT: WNL, normocephalic Pulmonary: normal non-labored breathing, without Rales, rhonchi,  wheezing Cardiac: regular Abdomen:  soft, NT/ND, no masses Skin: without rashes Vascular Exam/Pulses: symmetrical radials Musculoskeletal: no muscle wasting or atrophy  Neurologic: A&O X 3;  No focal weakness or paresthesias are detected; speech is fluent/normal Psychiatric:  The pt has Normal affect. Lymph:  Unremarkable  CBC    Component Value Date/Time   WBC 3.6 (L) 09/20/2021 1453   RBC 2.33 (L) 09/20/2021 1453   HGB 8.0 (L) 09/20/2021 1453   HGB 7.6 (L) 11/01/2020 0918   HCT 23.2 (L) 09/20/2021 1453   HCT 26.5 (L) 11/01/2020  0918   PLT 139 (L) 09/20/2021 1453   PLT 362 11/01/2020 0918   MCV 99.6 09/20/2021 1453   MCV 74 (L) 11/01/2020 0918   MCH 34.3 (H) 09/20/2021 1453   MCHC 34.5 09/20/2021 1453   RDW 14.6 09/20/2021 1453   RDW 16.8 (H) 11/01/2020 0918   LYMPHSABS 0.4 (L) 09/17/2021 0545   MONOABS 0.3 09/17/2021 0545   EOSABS 0.1 09/17/2021 0545   BASOSABS 0.0 09/17/2021 0545    BMET    Component Value Date/Time   NA 140 09/20/2021 1453   K 3.9 09/20/2021 1453   CL 102 09/20/2021 1453   CO2 29 09/20/2021 1453   GLUCOSE 101 (H) 09/20/2021 1453   BUN 32 (H) 09/20/2021 1453   CREATININE 10.02 (H) 09/20/2021 1453   CALCIUM 10.3 09/20/2021 1453   CALCIUM 7.7 (L) 02/18/2007 1522   GFRNONAA 6 (L) 09/20/2021 1453   GFRAA 7 (L) 12/25/2017 1243    COAGS: Lab Results  Component Value Date   INR 1.1 09/14/2021   INR 1.05 12/22/2017   INR 1.06 10/22/2015      ASSESSMENT/PLAN: This is a 56 y.o. male with partially removed right IJ [facility]   -Mr. James Wall is a 56 year old male being seen in consultation for evaluation of a partially removed right IJ TDC.  He is seen on HD thus the [facility] continues to  function properly.  On exam the skin sutures are no longer attached, and the cuff is exposed.  There is no sign of bleeding or hematoma.  He is aware he will require exchange of TDC.  On-call vascular surgeon Dr. Virl Cagey will evaluate the patient later today and provide further treatment plans depending on OR availability.   Dagoberto Ligas PA-C [facility] [phone]  VASCULAR STAFF ADDENDUM: I have independently interviewed and examined the patient. I agree with the above.  Patient needs tunneled dialysis catheter exchange.  Scheduled for tomorrow. Please make patient n.p.o. at midnight.  Cassandria Santee, MD [facility] Phone Number: [phone] 09/23/2021 8:58 PM

## 2021-09-23 NOTE — Procedures (Signed)
Pt seen in HD unit. CXR done today shows the tip is at the cavoatrial junction which is good. However, at the exit site I believe the cuff may be extruded.  This TDC was placed at [place] about 3 wks ago. I will ask [facility]VS to take a look at the [facility] to see what they think.  It is working well.  I was present at this dialysis session, have reviewed the session itself and made  appropriate changes Kelly Splinter MD [place] pager [phone]   09/23/2021, 3:31 PM

## 2021-09-23 NOTE — Progress Notes (Signed)
Pt returned to unit upset about having to wait up in dialysis for an hour before being transported back. Refused to allow v/s to be taken, nursing provided emotional support, pt then allowed v/s to be taken. Also assisted pt with acquiring dinner as kitchen was closed when pt returned to unit. Pt then received phone call which appeared to frustrate him more. Later calmed down and began to eat dinner. Pt was left in bed with safety measures in place, call bell within reach, and no further needs at this time.

## 2021-09-23 NOTE — Progress Notes (Signed)
Physical Therapy Session Note  Patient Details  Name: James Wall MRN: [id number] Date of Birth: 1965/06/29  Today's Date: 09/23/2021 PT Individual Time: 1100-1156 PT Individual Time Calculation (min): 56 min   Short Term Goals: Week 1:  PT Short Term Goal 1 (Week 1): Pt will perform bed mobility with CGA. PT Short Term Goal 2 (Week 1): Pt will perform bed to chair transfer with CGA. PT Short Term Goal 3 (Week 1): Pt will ambulate 150' with CGA and LRAD. PT Short Term Goal 4 (Week 1): Pt will improved [place] by MCID.  Skilled Therapeutic Interventions/Progress Updates:      Therapy Documentation Precautions:  Precautions Precautions: Fall Precaution Comments: L hemi; emotional lability; vision impairments (needs further testing to determine extent) Restrictions Weight Bearing Restrictions: No  Pt received seated in w/c at bedside and declines pain. Pt agreeable to treatment session with emphasis on gait training with SPC to increase independence with mobility. Pt progressed from CGA to supervision with gait and ambulated total of ~750 ft with SPC and required 4 seated rest breaks. Pt requires visual and verbal cues to increase step length and for sequencing with single point cane. Pt initially presented with decreased step length at decreased pace. Pt sang a high tempo song to facilitate walking at a faster speed. Pt able to progress from step to gait pattern to reciprocal. Pt utilized single hand rail and SPC to navigate 4 steps. Pt left seated in recliner at bedside with all needs in reach. PT updated nurse that chair alarm battery was dead.    Therapy/Group: Individual Therapy  Verl Dicker Verl Dicker PT, DPT  09/23/2021, 7:45 AM

## 2021-09-23 NOTE — H&P (View-Only) (Signed)
Hospital Consult    Reason for Consult:  [facility] issues Requesting Physician:  Dr. Jonnie Finner MRN #:  [id number]  History of Present Illness: This is a 56 y.o. male with past medical history significant for end-stage renal disease on hemodialysis.  He is being seen in consultation for evaluation for problems with his right IJ TDC.  This was placed by [facility] about a month ago.  The sutures have fallen out from the [facility] and now the cuff is exposed.  TDC continues to function properly however there is concern for [facility] coming all the way out.  He is known to the vascular service having had multiple interventions on bilateral upper extremities for permanent access.  He has not had any thigh AV graft however is adamantly against any further surgery for dialysis access.  He is however willing to undergo catheter exchanged during hospitalization.  He is on Plavix daily.  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 [facility]    primary cancer is prostate   Anemia associated with chronic renal failure    Anxiety    Arthritis    Cirrhosis, nonalcoholic ([place])    per pt possible from a medication , unsure ;   last ultrasound 08-09-2020 in epic no fibrosis   COPD (chronic obstructive pulmonary disease) ([place])    Depression    ESRD on hemodialysis ([place]) 2009   Started HD Jan 2009;  ESRD secondary to hypertensive nephrosclerosis;  dialysis at [facility] on MWF   First degree heart block    GERD (gastroesophageal reflux disease)    Hiatal hernia    History of acute respiratory failure 07/2013   admission;  HCAP w/ ARF with hypoxia   History of adenomatous polyp of colon    History of ascites    s/p paracentesis 01-31-2013 (5L) and last one 03-28-2013 (2.7L)   History of community acquired pneumonia 08/08/2020   admission ; LLL , POA   History of heart murmur in childhood    History of MRSA infection 12/19/2012   hospital  admission;  w/ sepsis MRSA bacteramia AVF infection   History of sepsis 03/2017   admission;   HCAP w/ sepsis   Hyperlipidemia    Hypertension    Hypertensive heart disease    cardiologist--- dr Osborne Oman;  nuclear stress test 06-16-2013 intermediate risk w/ mid-distal anterior wall ischemia , not gated;  cardiac cath 07-13-2013 in epic showed normal coronaries and LVSF,  ef not assessed, LCEDP 10mHg   Hypertensive nephrosclerosis, 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 ([place]    Intolerance to cold    due to anemia   Malignant neoplasm prostate ([facility]    urologist--- dr bell/  radiation onologist--- dr mTammi Klippel  dx 01/ 2023,  Gleason 4+3, PSA 361  NICM (nonischemic cardiomyopathy) ([place]    followed by cardiology;   last echo in epic 09-13-2020 ef 55-60%   OSA (obstructive sleep apnea) 2009   no  longer using cpap since the yr started 2009;   sleep study in epic 05-11-2007 severe osa   PSVT ([place]oxysmal [place]ventricular tachycardia) [place]    event monitor 08-01-2019  SR w/ SVT runs , rare PAC/ PVC   Secondary hyperparathyroidism ([place]    Wears glasses     Past Surgical History:  Procedure Laterality Date   AV FISTULA PLACEMENT Right 02/22/2013   Procedure:  CREATION  OF BRACHIAL CEPHALIC FISTULA  RIGHT ARM;  Surgeon: Elam Dutch, MD;  Location: [place];  Service: Vascular;  Laterality: Right;   AV FISTULA PLACEMENT Left 08/10/2014   Procedure: BASILIC VEIN TRANSPOSITION  ARTERIOVENOUS (AV) FISTULA CREATION LEFT UPPER ARM;  Surgeon: Mal Misty, MD;  Location: [place];  Service: Vascular;  Laterality: Left;   AV FISTULA PLACEMENT, RADIOCEPHALIC Left 24/40/1027   @[facility] ;  Left lower arm   COLONOSCOPY  11/30/2018   by dr Tarri Glenn   ESOPHAGOGASTRODUODENOSCOPY (EGD) WITH PROPOFOL N/A 04/12/2013   Procedure: ESOPHAGOGASTRODUODENOSCOPY (EGD) WITH PROPOFOL;  Surgeon: Arta Silence, MD;  Location: [facility];  Service: Endoscopy;  Laterality: N/A;   GOLD SEED  IMPLANT N/A 06/11/2021   Procedure: GOLD SEED IMPLANT;  Surgeon: Janith Lima, MD;  [facility];  Service: Urology;  Laterality: N/A;   INSERTION OF DIALYSIS CATHETER N/A 12/23/2012   Procedure: INSERTION OF DIALYSIS CATHETER; ULTRASOUND GUIDED;  Surgeon: Angelia Mould, MD;  Location: [place];  Service: Vascular;  Laterality: N/A;   INSERTION OF DIALYSIS CATHETER  10/22/2015   Right IJ non-tunneled HD catheter, placed again in 2019   IR FLUORO GUIDE CV LINE RIGHT  08/18/2019   IR FLUORO GUIDE CV LINE RIGHT  10/07/2019   IR GENERIC HISTORICAL  10/22/2015   IR US GUIDE VASC ACCESS RIGHT 10/22/2015 [facility]   IR GENERIC HISTORICAL  10/22/2015   IR FLUORO GUIDE CV LINE RIGHT 10/22/2015 [facility]   IR GENERIC HISTORICAL  10/23/2015   IR FLUORO GUIDE CV LINE RIGHT 10/23/2015 Marybelle Killings, MD [facility]   LEFT HEART CATHETERIZATION WITH CORONARY ANGIOGRAM N/A 07/13/2013   Procedure: LEFT HEART CATHETERIZATION WITH CORONARY ANGIOGRAM;  Surgeon: Jettie Booze, MD;  Location[facility] CATH LAB;  Service: Cardiovascular;  Laterality: N/A;   LIGATION OF ARTERIOVENOUS  FISTULA Left 12/22/2012   Procedure: LIGATION OF ARTERIOVENOUS  FISTULA;EXCISION OF LARGE ANEURYSMS;;  Surgeon: Elam Dutch, MD;  Location: [place];  Service: Vascular;  Laterality: Left;   SPACE OAR INSTILLATION N/A 06/11/2021   Procedure: SPACE OAR INSTILLATION;  Surgeon: Janith Lima, MD;  [facility];  Service: Urology;  Laterality: N/A;   UPPER GASTROINTESTINAL ENDOSCOPY  09/07/2018   by dr Tarri Glenn    Allergies  Allergen Reactions   Venofer  [Ferric Oxide] Itching   Aspirin Other (See Comments)    Avoids due to renal disease;  Made congestion worse     Prior to Admission medications   Medication Sig Start Date End Date Taking? Authorizing Provider  acetaminophen (TYLENOL) 500 MG tablet Take 500 mg by mouth every 6 (six) hours as needed.    [provider]  amLODipine (NORVASC) 10 MG tablet  TAKE 1 TABLET(10 MG) BY MOUTH DAILY Patient taking differently: Take 10 mg by mouth at bedtime. 04/26/18   Allred, Jeneen Rinks, MD  cloNIDine (CATAPRES - DOSED IN MG/24 HR) 0.3 mg/24hr patch Place 0.3 mg onto the skin once a week. Apply on Sundays 12/29/18   [provider]  clopidogrel (PLAVIX) 75 MG tablet Take 1 tablet (75 mg total) by mouth daily. 09/16/21   Sharion Settler, DO  famotidine (PEPCID) 10 MG tablet Take 1 tablet (10 mg total) by mouth daily as needed for heartburn. 09/16/21   Sharion Settler, DO  ferric gluconate 250 mg in sodium chloride 0.9 % 250 mL Inject 250 mg into the vein every Monday, Wednesday, and Friday with hemodialysis. 09/16/21   Sharion Settler, DO  hydrALAZINE (APRESOLINE) 25 MG tablet Take 1  tablet (25 mg total) by mouth 2 (two) times daily. Patient taking differently: Take 25 mg by mouth 2 (two) times daily. 09/13/20   Werner Lean, MD  isosorbide dinitrate (ISORDIL) 20 MG tablet Take 1 tablet (20 mg total) by mouth 2 (two) times daily. 09/16/21   Sharion Settler, DO  metoprolol tartrate (LOPRESSOR) 100 MG tablet Take 1 tablet (100 mg total) by mouth 2 (two) times daily. 09/16/21   Sharion Settler, DO  rosuvastatin (CRESTOR) 10 MG tablet Take 1 tablet (10 mg total) by mouth daily. 09/16/21   Sharion Settler, DO  sevelamer carbonate (RENVELA) 800 MG tablet Take 1,600-3,200 mg by mouth See admin instructions. Take 2-4 tablets by mouth daily  with meals depending on meal size per patient    [provider]  tamsulosin (FLOMAX) 0.4 MG CAPS capsule TAKE 2 CAPSULES(0.8 MG) BY MOUTH DAILY Patient taking differently: Take 0.4 mg by mouth daily after supper. 09/06/20   Cresenzo, Angelyn Punt, MD  umeclidinium-vilanterol (ANORO ELLIPTA) 62.5-25 MCG/INH AEPB Inhale 1 puff into the lungs daily. Patient taking differently: Inhale 1 puff into the lungs daily as needed (For shortness of breath). 09/20/19   Autry-Lott, Naaman Plummer, DO    Social History    Socioeconomic History   Marital status: Single    Spouse name: Not on file   Number of children: 0   Years of education: 12th   Highest education level: High school graduate  Occupational History   Occupation: n/a  Tobacco Use   Smoking status: Every Day    Packs/day: 0.50    Years: 17.00    Total pack years: 8.50    Types: Cigarettes   Smokeless tobacco: Never   Tobacco comments:    06-06-2021  per pt had quit smoking 06/ 2021 for 16 yrs , but restarted approx 10/ 2022 1pp 3-4 days  Vaping Use   Vaping Use: Never used  Substance and Sexual Activity   Alcohol use: Yes    Comment: drinks liquor occ   Drug use: Yes    Frequency: 4.0 times per week    Types: Marijuana    Comment: 06-06-2021 per pt smokes 2-4 weekly, (half of joint "blunt")   Sexual activity: Not Currently  Other Topics Concern   Not on file  Social History Narrative   Patient lives alone in [place].    Patient has never been married and does not have any children.    Patients support system, his mother, pass away 2016.   Patient does not own his own vehicle, uses public transportation with no concerns at this time.   Social Determinants of Health   Financial Resource Strain: Low Risk  (08/01/2020)   Overall Financial Resource Strain (CARDIA)    Difficulty of Paying Living Expenses: Not very hard  Food Insecurity: No Food Insecurity (08/01/2020)   Hunger Vital Sign    Worried About Running Out of Food in the Last Year: Never true    Ran Out of Food in the Last Year: Never true  Transportation Needs: Unmet Transportation Needs (08/27/2021)   PRAPARE - Transportation    Lack of Transportation (Medical): Yes    Lack of Transportation (Non-Medical): Yes  Physical Activity: Inactive (08/01/2020)   Exercise Vital Sign    Days of Exercise per Week: 0 days    Minutes of Exercise per Session: 0 min  Stress: No Stress Concern Present (08/01/2020)   [place]    Feeling of  Stress : Only a little  Social Connections: Socially Isolated (08/01/2020)   Social Connection and Isolation Panel [NHANES]    Frequency of Communication with Friends and Family: Twice a week    Frequency of Social Gatherings with Friends and Family: Twice a week    Attends Religious Services: Never    Marine scientist or Organizations: No    Attends Archivist Meetings: Never    Marital Status: Never married  Intimate Partner Violence: Not At Risk (08/01/2020)   Humiliation, Afraid, Rape, and Kick questionnaire    Fear of Current or Ex-Partner: No    Emotionally Abused: No    Physically Abused: No    Sexually Abused: No     Family History  Problem Relation Age of Onset   Hypertension Mother    Cerebrovascular Accident Father    Hypertension Father    Congestive Heart Failure Brother    Asthma Brother    Stomach cancer Neg Hx    Rectal cancer Neg Hx    Esophageal cancer Neg Hx    Colon cancer Neg Hx     ROS: Otherwise negative unless mentioned in HPI  Physical Examination  Vitals:   09/23/21 1530 09/23/21 1536  BP: 139/77 (!) 144/64  Pulse: (!) 49 (!) 50  Resp: 10 14  Temp: (!) 97.5 F (36.4 C)   SpO2: 98% 99%   Body mass index is 30.3 kg/m.  General:  WDWN in NAD Gait: Not observed HENT: WNL, normocephalic Pulmonary: normal non-labored breathing, without Rales, rhonchi,  wheezing Cardiac: regular Abdomen:  soft, NT/ND, no masses Skin: without rashes Vascular Exam/Pulses: symmetrical radials Musculoskeletal: no muscle wasting or atrophy  Neurologic: A&O X 3;  No focal weakness or paresthesias are detected; speech is fluent/normal Psychiatric:  The pt has Normal affect. Lymph:  Unremarkable  CBC    Component Value Date/Time   WBC 3.6 (L) 09/20/2021 1453   RBC 2.33 (L) 09/20/2021 1453   HGB 8.0 (L) 09/20/2021 1453   HGB 7.6 (L) 11/01/2020 0918   HCT 23.2 (L) 09/20/2021 1453   HCT 26.5 (L) 11/01/2020  0918   PLT 139 (L) 09/20/2021 1453   PLT 362 11/01/2020 0918   MCV 99.6 09/20/2021 1453   MCV 74 (L) 11/01/2020 0918   MCH 34.3 (H) 09/20/2021 1453   MCHC 34.5 09/20/2021 1453   RDW 14.6 09/20/2021 1453   RDW 16.8 (H) 11/01/2020 0918   LYMPHSABS 0.4 (L) 09/17/2021 0545   MONOABS 0.3 09/17/2021 0545   EOSABS 0.1 09/17/2021 0545   BASOSABS 0.0 09/17/2021 0545    BMET    Component Value Date/Time   NA 140 09/20/2021 1453   K 3.9 09/20/2021 1453   CL 102 09/20/2021 1453   CO2 29 09/20/2021 1453   GLUCOSE 101 (H) 09/20/2021 1453   BUN 32 (H) 09/20/2021 1453   CREATININE 10.02 (H) 09/20/2021 1453   CALCIUM 10.3 09/20/2021 1453   CALCIUM 7.7 (L) 02/18/2007 1522   GFRNONAA 6 (L) 09/20/2021 1453   GFRAA 7 (L) 12/25/2017 1243    COAGS: Lab Results  Component Value Date   INR 1.1 09/14/2021   INR 1.05 12/22/2017   INR 1.06 10/22/2015      ASSESSMENT/PLAN: This is a 56 y.o. male with partially removed right IJ [facility]   -Mr. Kirt Chew is a 56 year old male being seen in consultation for evaluation of a partially removed right IJ TDC.  He is seen on HD thus the [facility] continues to  function properly.  On exam the skin sutures are no longer attached, and the cuff is exposed.  There is no sign of bleeding or hematoma.  He is aware he will require exchange of TDC.  On-call vascular surgeon Dr. Virl Cagey will evaluate the patient later today and provide further treatment plans depending on OR availability.   Dagoberto Ligas PA-C [facility] [phone]  VASCULAR STAFF ADDENDUM: I have independently interviewed and examined the patient. I agree with the above.  Patient needs tunneled dialysis catheter exchange.  Scheduled for tomorrow. Please make patient n.p.o. at midnight.  Cassandria Santee, MD [facility] Phone Number: [phone] 09/23/2021 8:58 PM

## 2021-09-24 ENCOUNTER — Inpatient Hospital Stay (HOSPITAL_COMMUNITY): Payer: Medicare Other | Admitting: Anesthesiology

## 2021-09-24 ENCOUNTER — Other Ambulatory Visit: Payer: Self-pay

## 2021-09-24 ENCOUNTER — Encounter (HOSPITAL_COMMUNITY)
Admission: RE | Disposition: A | Discharge status: Home Health | Payer: Self-pay | Source: Intra-hospital | Attending: Physical Medicine & Rehabilitation

## 2021-09-24 ENCOUNTER — Inpatient Hospital Stay (HOSPITAL_COMMUNITY): Payer: Medicare Other

## 2021-09-24 DIAGNOSIS — N185 Chronic kidney disease, stage 5: Secondary | ICD-10-CM

## 2021-09-24 DIAGNOSIS — I509 Heart failure, unspecified: Secondary | ICD-10-CM

## 2021-09-24 DIAGNOSIS — Z992 Dependence on renal dialysis: Secondary | ICD-10-CM

## 2021-09-24 DIAGNOSIS — E1122 Type 2 diabetes mellitus with diabetic chronic kidney disease: Secondary | ICD-10-CM

## 2021-09-24 DIAGNOSIS — I132 Hypertensive heart and chronic kidney disease with heart failure and with stage 5 chronic kidney disease, or end stage renal disease: Secondary | ICD-10-CM

## 2021-09-24 DIAGNOSIS — I639 Cerebral infarction, unspecified: Secondary | ICD-10-CM | POA: Diagnosis not present

## 2021-09-24 DIAGNOSIS — N186 End stage renal disease: Secondary | ICD-10-CM

## 2021-09-24 DIAGNOSIS — D631 Anemia in chronic kidney disease: Secondary | ICD-10-CM

## 2021-09-24 DIAGNOSIS — F17210 Nicotine dependence, cigarettes, uncomplicated: Secondary | ICD-10-CM

## 2021-09-24 HISTORY — PX: EXCHANGE OF A DIALYSIS CATHETER: SHX5818

## 2021-09-24 LAB — POCT I-STAT, CHEM 8
BUN: 28 mg/dL — ABNORMAL HIGH (ref 6–20)
Calcium, Ion: 1.24 mmol/L (ref 1.15–1.40)
Chloride: 102 mmol/L (ref 98–111)
Creatinine, Ser: 9.1 mg/dL — ABNORMAL HIGH (ref 0.61–1.24)
Glucose, Bld: 89 mg/dL (ref 70–99)
HCT: 27 % — ABNORMAL LOW (ref 39.0–52.0)
Hemoglobin: 9.2 g/dL — ABNORMAL LOW (ref 13.0–17.0)
Potassium: 4.9 mmol/L (ref 3.5–5.1)
Sodium: 135 mmol/L (ref 135–145)
TCO2: 27 mmol/L (ref 22–32)

## 2021-09-24 SURGERY — EXCHANGE OF A DIALYSIS CATHETER
Anesthesia: General | Site: Neck | Laterality: Right

## 2021-09-24 MED ORDER — LIDOCAINE 2% (20 MG/ML) 5 ML SYRINGE
INTRAMUSCULAR | Status: DC | PRN
  Administered 2021-09-24: 40 mg via INTRAVENOUS

## 2021-09-24 MED ORDER — PROPOFOL 10 MG/ML IV BOLUS
INTRAVENOUS | Status: AC
  Filled 2021-09-24: qty 20

## 2021-09-24 MED ORDER — OXYCODONE HCL 5 MG PO TABS: 5.0000 mg | ORAL_TABLET | Freq: Once | ORAL | Status: DC | PRN

## 2021-09-24 MED ORDER — LIDOCAINE-EPINEPHRINE (PF) 1 %-1:200000 IJ SOLN
INTRAMUSCULAR | Status: DC | PRN
  Administered 2021-09-24: 20 mL

## 2021-09-24 MED ORDER — HEPARIN 6000 UNIT IRRIGATION SOLUTION
Status: AC
  Filled 2021-09-24: qty 500

## 2021-09-24 MED ORDER — FENTANYL CITRATE (PF) 100 MCG/2ML IJ SOLN: 25.0000 ug | INTRAMUSCULAR | Status: DC | PRN

## 2021-09-24 MED ORDER — CEFAZOLIN SODIUM-DEXTROSE 2-4 GM/100ML-% IV SOLN
INTRAVENOUS | Status: AC
  Filled 2021-09-24: qty 100

## 2021-09-24 MED ORDER — PROMETHAZINE HCL 25 MG/ML IJ SOLN: 6.2500 mg | INTRAMUSCULAR | Status: DC | PRN

## 2021-09-24 MED ORDER — CHLORHEXIDINE GLUCONATE 0.12 % MT SOLN
OROMUCOSAL | Status: AC
  Administered 2021-09-24: 15 mL
  Filled 2021-09-24: qty 15

## 2021-09-24 MED ORDER — LIDOCAINE-EPINEPHRINE (PF) 1 %-1:200000 IJ SOLN
INTRAMUSCULAR | Status: AC
  Filled 2021-09-24: qty 30

## 2021-09-24 MED ORDER — CEFAZOLIN SODIUM-DEXTROSE 2-3 GM-%(50ML) IV SOLR
INTRAVENOUS | Status: DC | PRN
  Administered 2021-09-24: 2 g via INTRAVENOUS

## 2021-09-24 MED ORDER — ONDANSETRON HCL 4 MG/2ML IJ SOLN
4.0000 mg | Freq: Once | INTRAMUSCULAR | Status: DC | PRN
Start: 2021-09-24 — End: 2021-09-24

## 2021-09-24 MED ORDER — FENTANYL CITRATE (PF) 250 MCG/5ML IJ SOLN
INTRAMUSCULAR | Status: DC | PRN
  Administered 2021-09-24: 50 ug via INTRAVENOUS

## 2021-09-24 MED ORDER — PROPOFOL 10 MG/ML IV BOLUS
INTRAVENOUS | Status: DC | PRN
  Administered 2021-09-24: 120 mg via INTRAVENOUS

## 2021-09-24 MED ORDER — OXYCODONE HCL 5 MG/5ML PO SOLN: 5.0000 mg | Freq: Once | ORAL | Status: DC | PRN

## 2021-09-24 MED ORDER — FENTANYL CITRATE (PF) 100 MCG/2ML IJ SOLN
INTRAMUSCULAR | Status: AC
  Filled 2021-09-24: qty 2

## 2021-09-24 MED ORDER — GLYCOPYRROLATE 0.2 MG/ML IJ SOLN
INTRAMUSCULAR | Status: DC | PRN
  Administered 2021-09-24: .4 mg via INTRAVENOUS

## 2021-09-24 MED ORDER — LIDOCAINE 2% (20 MG/ML) 5 ML SYRINGE
INTRAMUSCULAR | Status: AC
  Filled 2021-09-24: qty 10

## 2021-09-24 MED ORDER — ACETAMINOPHEN 325 MG PO TABS
650.0000 mg | ORAL_TABLET | Freq: Once | ORAL | Status: AC
  Administered 2021-09-24: 650 mg via ORAL
  Filled 2021-09-24: qty 2

## 2021-09-24 MED ORDER — HEPARIN 6000 UNIT IRRIGATION SOLUTION
Status: DC | PRN
  Administered 2021-09-24: 1

## 2021-09-24 MED ORDER — FENTANYL CITRATE (PF) 100 MCG/2ML IJ SOLN
25.0000 ug | INTRAMUSCULAR | Status: DC | PRN
  Administered 2021-09-24: 25 ug via INTRAVENOUS

## 2021-09-24 MED ORDER — HEPARIN SODIUM (PORCINE) 1000 UNIT/ML IJ SOLN
INTRAMUSCULAR | Status: AC
Start: 2021-09-24 — End: ?
  Filled 2021-09-24: qty 10

## 2021-09-24 MED ORDER — HEPARIN SODIUM (PORCINE) 1000 UNIT/ML IJ SOLN
INTRAMUSCULAR | Status: DC | PRN
  Administered 2021-09-24: 3200 [IU]

## 2021-09-24 MED ORDER — CHLORHEXIDINE GLUCONATE CLOTH 2 % EX PADS: 6.0000 | MEDICATED_PAD | Freq: Every day | CUTANEOUS | Status: DC

## 2021-09-24 MED ORDER — ONDANSETRON HCL 4 MG/2ML IJ SOLN
INTRAMUSCULAR | Status: DC | PRN
  Administered 2021-09-24: 4 mg via INTRAVENOUS

## 2021-09-24 MED ORDER — GLYCOPYRROLATE PF 0.2 MG/ML IJ SOSY
PREFILLED_SYRINGE | INTRAMUSCULAR | Status: AC
  Filled 2021-09-24: qty 1

## 2021-09-24 MED ORDER — DEXAMETHASONE SODIUM PHOSPHATE 10 MG/ML IJ SOLN
INTRAMUSCULAR | Status: DC | PRN
  Administered 2021-09-24: 8 mg via INTRAVENOUS

## 2021-09-24 MED ORDER — 0.9 % SODIUM CHLORIDE (POUR BTL) OPTIME
TOPICAL | Status: DC | PRN
  Administered 2021-09-24: 1000 mL

## 2021-09-24 MED ORDER — SODIUM CHLORIDE 0.9 % IV SOLN: INTRAVENOUS | Status: DC | PRN

## 2021-09-24 MED ORDER — FENTANYL CITRATE (PF) 250 MCG/5ML IJ SOLN
INTRAMUSCULAR | Status: AC
  Filled 2021-09-24: qty 5

## 2021-09-24 SURGICAL SUPPLY — 36 items
ADH SKN CLS APL DERMABOND .7 (GAUZE/BANDAGES/DRESSINGS) ×1
BAG COUNTER SPONGE SURGICOUNT (BAG) ×2 IMPLANT
BAG DECANTER FOR FLEXI CONT (MISCELLANEOUS) ×2 IMPLANT
BAG SPNG CNTER NS LX DISP (BAG) ×1
BIOPATCH RED 1 DISK 7.0 (GAUZE/BANDAGES/DRESSINGS) ×2 IMPLANT
CATH PALINDROME-P 19CM W/VT (CATHETERS) IMPLANT
COVER PROBE W GEL 5X96 (DRAPES) IMPLANT
COVER SURGICAL LIGHT HANDLE (MISCELLANEOUS) ×2 IMPLANT
DERMABOND ADVANCED (GAUZE/BANDAGES/DRESSINGS) ×1
DERMABOND ADVANCED .7 DNX12 (GAUZE/BANDAGES/DRESSINGS) ×2 IMPLANT
DRAPE C-ARM 42X72 X-RAY (DRAPES) ×2 IMPLANT
DRAPE CHEST BREAST 15X10 FENES (DRAPES) ×2 IMPLANT
GAUZE 4X4 16PLY ~~LOC~~+RFID DBL (SPONGE) ×2 IMPLANT
GAUZE SPONGE 2X2 8PLY STRL LF (GAUZE/BANDAGES/DRESSINGS) ×2 IMPLANT
GLOVE BIO SURGEON STRL SZ7.5 (GLOVE) ×2 IMPLANT
GLOVE BIOGEL PI IND STRL 8 (GLOVE) ×2 IMPLANT
GLOVE BIOGEL PI INDICATOR 8 (GLOVE) ×1
GLOVE SURG UNDER LTX SZ8 (GLOVE) ×2 IMPLANT
GOWN STRL REUS W/ TWL LRG LVL3 (GOWN DISPOSABLE) ×6 IMPLANT
GOWN STRL REUS W/TWL LRG LVL3 (GOWN DISPOSABLE) ×3
KIT BASIN OR (CUSTOM PROCEDURE TRAY) ×2 IMPLANT
KIT TURNOVER KIT B (KITS) ×2 IMPLANT
NDL HYPO 25GX1X1/2 BEV (NEEDLE) ×2 IMPLANT
NEEDLE 22X1 1/2 (OR ONLY) (NEEDLE) ×2 IMPLANT
NEEDLE HYPO 25GX1X1/2 BEV (NEEDLE) ×1 IMPLANT
NS IRRIG 1000ML POUR BTL (IV SOLUTION) ×2 IMPLANT
PACK SURGICAL SETUP 50X90 (CUSTOM PROCEDURE TRAY) ×2 IMPLANT
PAD ARMBOARD 7.5X6 YLW CONV (MISCELLANEOUS) ×4 IMPLANT
SUT ETHILON 3 0 PS 1 (SUTURE) ×2 IMPLANT
SUT MNCRL AB 4-0 PS2 18 (SUTURE) ×2 IMPLANT
SYR 10ML LL (SYRINGE) ×2 IMPLANT
SYR 20ML LL LF (SYRINGE) ×4 IMPLANT
SYR 5ML LL (SYRINGE) ×4 IMPLANT
SYR CONTROL 10ML LL (SYRINGE) ×2 IMPLANT
TOWEL GREEN STERILE (TOWEL DISPOSABLE) ×2 IMPLANT
WATER STERILE IRR 1000ML POUR (IV SOLUTION) ×2 IMPLANT

## 2021-09-24 NOTE — Anesthesia Postprocedure Evaluation (Signed)
Anesthesia Post Note  Patient: James Wall  Procedure(s) Performed: EXCHANGE OF A DIALYSIS CATHETER (Right: Neck)     Patient location during evaluation: PACU Anesthesia Type: General Level of consciousness: awake and alert Pain management: pain level controlled Vital Signs Assessment: post-procedure vital signs reviewed and stable Respiratory status: spontaneous breathing, nonlabored ventilation, respiratory function stable and patient connected to nasal cannula oxygen Cardiovascular status: blood pressure returned to baseline and stable Postop Assessment: no apparent nausea or vomiting Anesthetic complications: no   No notable events documented.  Last Vitals:  Vitals:   09/24/21 1545 09/24/21 1630  BP: (!) 180/84 135/81  Pulse: 62 (!) 56  Resp: 18 20  Temp:    SpO2: (P) 93% 96%    Last Pain:  Vitals:   09/24/21 1515  TempSrc:   PainSc: 0-No pain                 Audry Pili

## 2021-09-24 NOTE — Anesthesia Procedure Notes (Signed)
Procedure Name: LMA Insertion Date/Time: 09/24/2021 2:32 PM  Performed by: Mariea Clonts, CRNAPre-anesthesia Checklist: Patient identified, Emergency Drugs available, Suction available and Patient being monitored Patient Re-evaluated:Patient Re-evaluated prior to induction Oxygen Delivery Method: Circle System Utilized Preoxygenation: Pre-oxygenation with 100% oxygen Induction Type: IV induction Ventilation: Mask ventilation without difficulty LMA: LMA inserted LMA Size: 4.0 Number of attempts: 1 Airway Equipment and Method: Bite block Placement Confirmation: positive ETCO2 Tube secured with: Tape Dental Injury: Teeth and Oropharynx as per pre-operative assessment

## 2021-09-24 NOTE — Progress Notes (Signed)
Occupational Therapy Session Note  Patient Details  Name: James Wall MRN: [id number] Date of Birth: 13-May-1965  Today's Date: 09/24/2021 OT Individual Time: 1660-6004 OT Individual Time Calculation (min): 31 min    Short Term Goals: Week 1:  OT Short Term Goal 1 (Week 1): Pt will complete grooming standing at the sink with CGA for safety. OT Short Term Goal 2 (Week 1): Pt will complete UB dressing with supervision using hemi-techniques while seated. OT Short Term Goal 3 (Week 1): Pt will complete LB dressing with CGA for safety using hemi-techniques. OT Short Term Goal 4 (Week 1): Pt will complete toileting and toilet transfer with CGA for safety with LRAD as needed. OT Short Term Goal 5 (Week 1): Pt will complete tub-shower transfer using a tub-bench with CGA for safety and LRAD as needed.  Skilled Therapeutic Interventions/Progress Updates:  Pt awake sitting in the recliner upon OT arrival to the room. Pt reports, "My nose is kind of runny, but other than that I'm good." Pt in agreement for OT session.  Therapy Documentation Precautions:  Precautions Precautions: Fall Precaution Comments: L hemi; emotional lability; vision impairments (needs further testing to determine extent) Restrictions Weight Bearing Restrictions: No Vital Signs: Please see "Flowsheet" for most recent vitals charted by nursing staff.  Pain: Pain Assessment Pain Scale: 0-10 Pain Score: 0-No pain  ADL: Pt declines need to perform ADL's at this time. Pt reports completing prior to OT session.   Functional Mobility: Pt participates in functional mobility in order to improve functional endurance and independence with ambulation. Pt able to perform various sit <> stand transfers from a variety of surfaces and ambulate from room <> therapy gym (~100') with close supervision (placed on L side) with use of SPC. Pt demo's no LOB and good safety awareness throughout.   Standing Balance: Pt participates in  therapeutic visual scanning and midline crossing activity (BITS) while standing. Pt able to maintain standing balance while using RUE to cross midline and reach outsides of BOS to target a moving object for 2 minutes x 2 trials with close supervision for safety and no LOB noted.   Therapeutic Visual Scanning/Midline Crossing Activity: Pt participates in visual scanning and midline crossing activity using the BITS to improve independence with functional reaching, divided attention, midline crossing, and visual scanning skills which are needed for improved independence in mobility and ADLs. Pt able to perform 2 minutes x 2 trails using user paced visual scanning with central fixation to include a divided attention and multi-step activity. Pt demo's no difficulty with scanning in B visual fields. However,pt does requires minimal VC's for divided attention to select central fixation during task. Pt reports no headache or s/s of visual fatigue during activity.   Pt requested to stay in the recliner at end of session. Pt left sitting comfortably in the recliner with personal belongings and call light within reach, belt alarm placed and activated, and comfort needs attended to.   Therapy/Group: Individual Therapy  Barbee Shropshire 09/24/2021, 10:09 AM

## 2021-09-24 NOTE — Transfer of Care (Signed)
Immediate Anesthesia Transfer of Care Note  Patient: James Wall  Procedure(s) Performed: EXCHANGE OF A DIALYSIS CATHETER (Right: Neck)  Patient Location: PACU  Anesthesia Type:General  Level of Consciousness: awake, alert  and oriented  Airway & Oxygen Therapy: Patient Spontanous Breathing  Post-op Assessment: Report given to RN, Post -op Vital signs reviewed and stable and Patient moving all extremities X 4  Post vital signs: Reviewed and stable  Last Vitals:  Vitals Value Taken Time  BP 146/77 09/24/21 1515  Temp    Pulse 65 09/24/21 1522  Resp 21 09/24/21 1522  SpO2 95 % 09/24/21 1522  Vitals shown include unvalidated device data.  Last Pain:  Vitals:   09/24/21 1338  TempSrc: Oral  PainSc: 0-No pain         Complications: No notable events documented.

## 2021-09-24 NOTE — Interval H&P Note (Signed)
History and Physical Interval Note:  09/24/2021 1:45 PM  James Wall  has presented today for surgery, with the diagnosis of [facility] Exchange.  The various methods of treatment have been discussed with the patient and family. After consideration of risks, benefits and other options for treatment, the patient has consented to  Procedure(s): EXCHANGE OF A DIALYSIS CATHETER (N/A) as a surgical intervention.  The patient's history has been reviewed, patient examined, no change in status, stable for surgery.  I have reviewed the patient's chart and labs.  Questions were answered to the patient's satisfaction.     Deitra Mayo

## 2021-09-24 NOTE — Anesthesia Preprocedure Evaluation (Addendum)
Anesthesia Evaluation  Patient identified by MRN, date of birth, ID band Patient awake    Reviewed: Allergy & Precautions, NPO status , Patient's Chart, lab work & pertinent test results, reviewed documented beta blocker date and time   History of Anesthesia Complications Negative for: history of anesthetic complications  Airway Mallampati: III  TM Distance: >3 FB Neck ROM: Full    Dental  (+) Dental Advisory Given   Pulmonary sleep apnea , COPD,  COPD inhaler, Current Smoker and Patient abstained from smoking.,    Pulmonary exam normal        Cardiovascular hypertension, Pt. on medications and Pt. on home beta blockers pulmonary hypertension+ Peripheral Vascular Disease and +CHF  Normal cardiovascular exam+ dysrhythmias Supra Ventricular Tachycardia + Valvular Problems/Murmurs MR    '23 TTE - EF 55 to 60%. There is moderate concentric left ventricular hypertrophy. Grade III diastolic dysfunction  (restrictive). There is severely elevated pulmonary artery systolic pressure. The estimated right ventricular systolic pressure is 06.0 mmHg. Left atrial size was severely dilated. Mild to moderate mitral valve regurgitation. Tricuspid valve regurgitation is mild to moderate.     Neuro/Psych PSYCHIATRIC DISORDERS Anxiety Depression CVA, Residual Symptoms    GI/Hepatic hiatal hernia, GERD  Medicated,(+) Cirrhosis       ,   Endo/Other  diabetes, Type 2Hypothyroidism   Renal/GU Dialysis and ESRFRenal disease   Prostate cancer    Musculoskeletal  (+) Arthritis ,   Abdominal   Peds  Hematology  (+) Blood dyscrasia, anemia ,  On plavix    Anesthesia Other Findings   Reproductive/Obstetrics                            Anesthesia Physical  Anesthesia Plan  ASA: 4  Anesthesia Plan: General   Post-op Pain Management: Minimal or no pain anticipated   Induction: Intravenous  PONV Risk Score and  Plan: 1 and Treatment may vary due to age or medical condition, Midazolam and Ondansetron  Airway Management Planned: LMA  Additional Equipment: None  Intra-op Plan:   Post-operative Plan: Extubation in OR  Informed Consent: I have reviewed the patients History and Physical, chart, labs and discussed the procedure including the risks, benefits and alternatives for the proposed anesthesia with the patient or authorized representative who has indicated his/her understanding and acceptance.     Dental advisory given  Plan Discussed with: CRNA and Anesthesiologist  Anesthesia Plan Comments:        Anesthesia Quick Evaluation

## 2021-09-24 NOTE — Plan of Care (Signed)
  Problem: RH Expression Communication Goal: LTG Patient will increase speech intelligibility (SLP) Description: LTG: Patient will increase speech intelligibility at word/phrase/conversation level with cues, % of the time (SLP) Outcome: Completed/Met   Problem: RH Problem Solving Goal: LTG Patient will demonstrate problem solving for (SLP) Description: LTG:  Patient will demonstrate problem solving for basic/complex daily situations with cues  (SLP) Flowsheets (Taken 09/24/2021 1228) LTG Patient will demonstrate problem solving for:  Modified Independent  Supervision Note: Goal advanced due to steady progress   Problem: RH Awareness Goal: LTG: Patient will demonstrate awareness during functional activites type of (SLP) Description: LTG: Patient will demonstrate awareness during functional activites type of (SLP) Flowsheets (Taken 09/24/2021 1228) LTG: Patient will demonstrate awareness during cognitive/linguistic activities with assistance of (SLP): Modified Independent Note: Goal advanced due to steady progress

## 2021-09-24 NOTE — Progress Notes (Signed)
Patient arrieved back on the unit from PACU at approximately 1645. Patient had his Dialysis cath changed in his right neck. Patient awake and alert. Rates pain 2/10 at this time. Denies nausea at this time. Resting in bed.

## 2021-09-24 NOTE — Progress Notes (Addendum)
Physical Therapy Session Note  Patient Details  Name: James Wall MRN: [id number] Date of Birth: May 08, 1965  Today's Date: 09/24/2021 PT Individual Time: 0900-0933 PT Individual Time Calculation (min): 33 min   Short Term Goals: Week 1:  PT Short Term Goal 1 (Week 1): Pt will perform bed mobility with CGA. PT Short Term Goal 2 (Week 1): Pt will perform bed to chair transfer with CGA. PT Short Term Goal 3 (Week 1): Pt will ambulate 150' with CGA and LRAD. PT Short Term Goal 4 (Week 1): Pt will improved Berg by MCID.  Skilled Therapeutic Interventions/Progress Updates:      Therapy Documentation Precautions:  Precautions Precautions: Fall Precaution Comments: L hemi; emotional lability; vision impairments (needs further testing to determine extent) Restrictions Weight Bearing Restrictions: No   Pt received standing at sink performing ADL's with nurse present and without complaints of pain throughout session. Pt supervision with ambulation in room and obstacle negotiation to collect glasses and hat. Pt ambulated 140 ft to main gym with [facility] and supervision for safety. Pt performed BERG balance assessment and scored 45/56. On evaluation pt scored 27/56 and has demonstrated significant improvement in score. Patient demonstrates increased fall risk as noted by score of   45/56 on Berg Balance Scale.  (<36= high risk for falls, close to 100%; 37-45 significant >80%; 46-51 moderate >50%; 52-55 lower >25%). Pt ambulated to room with [facility] and S and left seated in recliner with all needs in reach.   Balance Balance Assessed: Yes Standardized Balance Assessment Standardized Balance Assessment: Berg Balance Test Berg Balance Test Sit to Stand: Able to stand without using hands and stabilize independently Standing Unsupported: Able to stand safely 2 minutes Sitting with Back Unsupported but Feet Supported on Floor or Stool: Able to sit safely and securely 2 minutes Stand to Sit: Sits  safely with minimal use of hands Transfers: Able to transfer safely, minor use of hands Standing Unsupported with Eyes Closed: Able to stand 10 seconds with supervision Standing Ubsupported with Feet Together: Able to place feet together independently and stand 1 minute safely From Standing, Reach Forward with Outstretched Arm: Can reach forward >5 cm safely (2") From Standing Position, Pick up Object from Floor: Able to pick up shoe, needs supervision From Standing Position, Turn to Look Behind Over each Shoulder: Looks behind from both sides and weight shifts well Turn 360 Degrees: Able to turn 360 degrees safely one side only in 4 seconds or less Standing Unsupported, Alternately Place Feet on Step/Stool: Able to complete 4 steps without aid or supervision Standing Unsupported, One Foot in Front: Able to plae foot ahead of the other independently and hold 30 seconds Standing on One Leg: Tries to lift leg/unable to hold 3 seconds but remains standing independently Total Score: 45   Therapy/Group: Individual Therapy  Verl Dicker Verl Dicker PT, DPT  09/24/2021, 7:39 AM

## 2021-09-24 NOTE — Progress Notes (Signed)
PROGRESS NOTE   Subjective/Complaints:  No issues overnite , just did not sleep well. Scheduled for dialysis catheter exchange, per pt was told he will not have conscious sedation or GA.     ROS: Patient denies CP, SOB, N/V/D  Objective:   DG CHEST PORT 1 VIEW  Result Date: 09/23/2021 CLINICAL DATA:  End-stage renal disease EXAM: PORTABLE CHEST 1 VIEW COMPARISON:  08/08/2020 FINDINGS: Right IJ approach hemodialysis catheter terminates at the superior cavoatrial junction. Stable cardiomegaly. Aortic atherosclerosis. Mildly prominent bibasilar interstitial markings. No focal consolidation. No pleural effusion or pneumothorax. The visualized skeletal structures are unremarkable. IMPRESSION: Mildly prominent bibasilar interstitial markings, could reflect mild edema. Electronically Signed   By: Davina Poke D.O.   On: 09/23/2021 10:26    Recent Labs    09/23/21 1530  WBC 3.4*  HGB 7.7*  HCT 23.4*  PLT 166    Recent Labs    09/23/21 1718  NA 137  K 4.6  CL 96*  CO2 29  GLUCOSE 104*  BUN 49*  CREATININE 12.78*  CALCIUM 10.6*     Intake/Output Summary (Last 24 hours) at 09/24/2021 0815 Last data filed at 09/23/2021 1850 Gross per 24 hour  Intake 420 ml  Output 2 ml  Net 418 ml         Physical Exam: Vital Signs Blood pressure 136/71, pulse 62, temperature 98.4 F (36.9 C), resp. rate 17, height 5' 5"  (1.651 m), weight 80.3 kg, SpO2 100 %.   General: No acute distress Mood and affect are appropriate Heart: Regular rate and rhythm no rubs murmurs or extra sounds Lungs: Clear to auscultation, breathing unlabored, no rales or wheezes Abdomen: Positive bowel sounds, soft nontender to palpation, nondistended Extremities: No clubbing, cyanosis, or edema  Skin: No evidence of breakdown, no evidence of rash, HD cath site without erythema, clean, covered  Neurologic: Cranial nerves II through XII intact, motor  strength is 5/5 in right  deltoid, bicep, tricep, grip, hip flexor, knee extensors, ankle dorsiflexor and plantar flexor 3- LUE, 4- LLE Sensory exam normal sensation to light touch and proprioception in bilateral upper and lower extremities  Musculoskeletal: Full range of motion in all 4 extremities. No joint swelling   Assessment/Plan: 1. Functional deficits which require 3+ hours per day of interdisciplinary therapy in a comprehensive inpatient rehab setting. Physiatrist is providing close team supervision and 24 hour management of active medical problems listed below. Physiatrist and rehab team continue to assess barriers to discharge/monitor patient progress toward functional and medical goals  Care Tool:  Bathing    Body parts bathed by patient: Left arm, Chest, Abdomen, Front perineal area, Right upper leg, Left upper leg, Face   Body parts bathed by helper: Buttocks, Right arm, Right lower leg, Left lower leg     Bathing assist Assist Level: Moderate Assistance - Patient 50 - 74%     Upper Body Dressing/Undressing Upper body dressing   What is the patient wearing?: Pull over shirt    Upper body assist Assist Level: Contact Guard/Touching assist    Lower Body Dressing/Undressing Lower body dressing      What is the patient wearing?: Pants  Lower body assist Assist for lower body dressing: Moderate Assistance - Patient 50 - 74%     Toileting Toileting Toileting Activity did not occur (Clothing management and hygiene only): N/A (no void or bm)  Toileting assist Assist for toileting: Moderate Assistance - Patient 50 - 74%     Transfers Chair/bed transfer  Transfers assist  Chair/bed transfer activity did not occur: Safety/medical concerns  Chair/bed transfer assist level: Minimal Assistance - Patient > 75%     Locomotion Ambulation   Ambulation assist      Assist level: Supervision/Verbal cueing Assistive device: Cane-straight Max distance: 750  feet (total distance with 4 seated rest breaks)   Walk 10 feet activity   Assist     Assist level: Supervision/Verbal cueing Assistive device: Cane-straight   Walk 50 feet activity   Assist    Assist level: Supervision/Verbal cueing Assistive device: Cane-straight    Walk 150 feet activity   Assist    Assist level: Supervision/Verbal cueing Assistive device: Cane-straight    Walk 10 feet on uneven surface  activity   Assist     Assist level: Minimal Assistance - Patient > 75% Assistive device:  (handrail)   Wheelchair     Assist Is the patient using a wheelchair?: No             Wheelchair 50 feet with 2 turns activity    Assist            Wheelchair 150 feet activity     Assist          Blood pressure 136/71, pulse 62, temperature 98.4 F (36.9 C), resp. rate 17, height 5' 5"  (1.651 m), weight 80.3 kg, SpO2 100 %.  Medical Problem List and Plan: 1. 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  Recommendations of 30-day monitor             -patient may shower             -ELOS/Goals: 10/01/2021 supervision goals  -Continue CIR therapies including PT, OT, and SLP  Team conf in am  2.  Antithrombotics: -DVT/anticoagulation:  Mechanical: Antiembolism stockings, thigh (TED hose) Bilateral lower extremities             -antiplatelet therapy: Plavix 75 mg daily only.  DAPT held due to chronic anemia 3. Pain Management: N/A 4. Mood/Behavior/Sleep: Provide emotional support             -antipsychotic agents: N/A 5. Neuropsych/cognition: This patient is capable of making decisions on his own behalf. 6. Skin/Wound Care: Routine skin checks 7. Fluids/Electrolytes/Nutrition: Routine in and outs with follow-up chemistries 8.  Hypertension.  Norvasc 10 mg daily, clonidine patch 0.3 mg weekly, hydralazine 25 mg twice daily, Isordil 20 mg daily, Lopressor 100 mg twice daily.   Vitals:   09/23/21  2240 09/24/21 0444  BP: 110/68 136/71  Pulse:  62  Resp:  17  Temp:  98.4 F (36.9 C)  SpO2:  100%    BP fair to good,  management per nephrology   9.  End-stage renal disease.  Hemodialysis as directed per renal services             -HD at end of day to allow participation in therapies  -HD catheter not sutured/partially moved. Nephrology aware. They will address Monday   10.  Hyperlipidemia.  Crestor 11.  Nonalcoholic cirrhosis with ascites.  Paracentesis 2014-2015. 12.  History of prostate cancer.  Followed outpatient by  Dr. Gloriann Loan and recently completed radiation therapy.  Continue Flomax 13.  History of first-degree AV block.  No chest pain or shortness of breath 14.  Tobacco abuse.  Counseling 15.  COPD.  OSA.  Patient no longer on CPAP. 16.  Chronic anemia.    IV iron weekly with ferric per hemodialysis, transfuse if < 7.0 , consider  if pt becomes orthostatic or tachy in therapy .  Hgb improved yesterday , may be r/t fluid status   17.  Hx of GERD, took a tablet at home for this will start PPI 18.  Mild thrombocytopenia, no bleeding monitor plt count no sq heparin for DVT prophyllaxis   LOS: 8 days A FACE TO FACE EVALUATION WAS PERFORMED  Charlett Blake 09/24/2021, 8:15 AM

## 2021-09-24 NOTE — Progress Notes (Signed)
Physical Therapy Weekly Progress Note  Patient Details  Name: James Wall MRN: [id number] Date of Birth: August 28, 1965  Beginning of progress report period: September 17, 2021 End of progress report period: September 24, 2021  Patient has met 4 of 4 short term goals.  Patient is making excellent progress to rehab goals. Pt requires supervision with bed mobility, transfers and gait with SPC. Pt requires CGA with steps x 4 ( 6 inches) with 1 hand rail and single point cane. Pt demonstrates clinical significant difference in BERG balance assessment. Plan to continue family and patient education and continue to work on mobility specifically stair training as pt has 1 step from driveway with no rails and 1 step to enter residence with 1 handrail.   Patient continues to demonstrate the following deficits decreased cardiorespiratoy endurance, decreased coordination and decreased motor planning, and decreased standing balance and decreased balance strategies and therefore will continue to benefit from skilled PT intervention to increase functional independence with mobility.  Patient progressing toward long term goals..  Continue plan of care.  PT Short Term Goals Week 1:  PT Short Term Goal 1 (Week 1): Pt will perform bed mobility with CGA. PT Short Term Goal 1 - Progress (Week 1): Met PT Short Term Goal 2 (Week 1): Pt will perform bed to chair transfer with CGA. PT Short Term Goal 2 - Progress (Week 1): Met PT Short Term Goal 3 (Week 1): Pt will ambulate 150' with CGA and LRAD. PT Short Term Goal 3 - Progress (Week 1): Met PT Short Term Goal 4 (Week 1): Pt will improved Berg by MCID. PT Short Term Goal 4 - Progress (Week 1): Met Week 2:  PT Short Term Goal 1 (Week 2): STG=LTG due to LOS  Skilled Therapeutic Interventions/Progress Updates:  Ambulation/gait training;Community reintegration;DME/adaptive equipment instruction;Neuromuscular re-education;Psychosocial support;Stair training;UE/LE  Strength taining/ROM;Balance/vestibular training;Discharge planning;Functional electrical stimulation;Pain management;Skin care/wound management;UE/LE Coordination activities;Therapeutic Activities;Cognitive remediation/compensation;Disease management/prevention;Functional mobility training;Patient/family education;Splinting/orthotics;Therapeutic Exercise;Visual/perceptual remediation/compensation   Therapy Documentation Precautions:  Precautions Precautions: Fall Precaution Comments: L hemi; emotional lability; vision impairments (needs further testing to determine extent) Restrictions Weight Bearing Restrictions: No     Therapy/Group: Individual Therapy  Verl Dicker 09/24/2021, 12:39 PM

## 2021-09-24 NOTE — Progress Notes (Signed)
Physical Therapy Session Note  Patient Details  Name: Kenderick Kobler MRN: [id number] Date of Birth: 04-18-1965  Today's Date: 09/24/2021 PT Missed Time: 45 Minutes Missed Time Reason: Unavailable (Comment) (off unit for surgical procedure)  Short Term Goals: Week 1:  PT Short Term Goal 1 (Week 1): Pt will perform bed mobility with CGA. PT Short Term Goal 1 - Progress (Week 1): Met PT Short Term Goal 2 (Week 1): Pt will perform bed to chair transfer with CGA. PT Short Term Goal 2 - Progress (Week 1): Met PT Short Term Goal 3 (Week 1): Pt will ambulate 150' with CGA and LRAD. PT Short Term Goal 3 - Progress (Week 1): Met PT Short Term Goal 4 (Week 1): Pt will improved Berg by MCID. PT Short Term Goal 4 - Progress (Week 1): Met Week 2:  PT Short Term Goal 1 (Week 2): STG=LTG due to LOS   Skilled Therapeutic Interventions/Progress Updates:   Pt missed 45 min of skilled therapy due to being off unit for surgical procedure. Will re-attempt as schedule and pt availability permits.   Therapy Documentation Precautions:  Precautions Precautions: Fall Precaution Comments: L hemi; emotional lability; vision impairments (needs further testing to determine extent) Restrictions Weight Bearing Restrictions: No General: PT Amount of Missed Time (min): 45 Minutes PT Missed Treatment Reason: Unavailable (Comment) (off unit for surgical procedure)  Therapy/Group: Individual Therapy  Alger Simons PT, DPT, CSRS 09/24/2021, 4:08 PM

## 2021-09-24 NOTE — Progress Notes (Signed)
Physical Therapy Session Note  Patient Details  Name: James Wall MRN: [id number] Date of Birth: [birth year]  Today's Date: 09/24/2021 PT Missed Time: 60 Minutes Missed Time Reason: Unavailable (Comment) (off-unit for surgery)  Pt being taken off the floor to address his dialysis catheter. Missed 60 minutes of skilled physical therapy. Will make-up time as scheduling allows.  Tawana Scale , PT, DPT, NCS, CSRS 09/24/2021, 12:27 PM

## 2021-09-24 NOTE — Progress Notes (Signed)
Speech Language Pathology Weekly Progress and Session Note  Patient Details  Name: James Wall MRN: [id number] Date of Birth: 1965-07-12  Beginning of progress report period: September 17, 2021 End of progress report period: September 24, 2021  Today's Date: 09/24/2021 SLP Individual Time: 6047-9987 SLP Individual Time Calculation (min): 41 min  Short Term Goals: Week 1: SLP Short Term Goal 1 (Week 1): Pt will utilize speech intelligiblity strategies at the conversational level with mod I SLP Short Term Goal 1 - Progress (Week 1): Met SLP Short Term Goal 2 (Week 1): Patient will complete mildly complex problem solving tasks with min-to-mod A verbal/visual cues SLP Short Term Goal 2 - Progress (Week 1): Met SLP Short Term Goal 3 (Week 1): Patient will utilize external memory strategies to recall novel information with min A SLP Short Term Goal 3 - Progress (Week 1): Met SLP Short Term Goal 4 (Week 1): Patient will demonstrate awareness to errors during functional cognitive tasks with min A SLP Short Term Goal 4 - Progress (Week 1): Met  New Short Term Goals: Week 2: SLP Short Term Goal 1 (Week 2): STG=LTG due to ELOS  Weekly Progress Updates: Pt has demonstrated excellent gains, as evident by meeting 4 out of 4 short-term goals this reporting period. Pt is currently completing higher level cognitive tasks with sup-to-min A in regards to problem solving, emergent/anticipatory awareness, and recall tasks. Pt independently implements speech intelligibility strategies at the conversation level and feels his speech has returned to baseline. Pt also feels cognition is near baseline and would like to continue to address high level problem solving tasks. Treatment intensity was modified 1-3x. Pt and family education is ongoing. Pt would continue to benefit from skilled ST intervention in CIR and would benefit from [facility] or OP SLP at discharge.   Intensity: Minumum of 1-2 x/day, 30 to 90  minutes Frequency: 1 to 3 out of 7 days Duration/Length of Stay: 8/29 Treatment/Interventions: Cognitive remediation/compensation;Functional tasks;Patient/family education;Therapeutic Activities  Daily Session Skilled Therapeutic Interventions: Skilled ST treatment focused on cognitive goals. SLP facilitated session by providing overall sup A verbal cues for organizational techniques for identification of intentional organization errors in daily BID pillbox. Pt was independent for generating appropriate solutions, and sup A verbal cues for problem solving hypothetical scenarios pertaining to medication management. Pt feels he is near cognitive baseline but would like to continue addressing higher level problem solving skills. Pt feels speech has returned to baseline with no current concerns at this time. Plan to reduce treatment intensity to 1-3x weekly, as well as discontinue speech goals. Pt verbalized agreement with plan. Patient was left in recliner with alarm activated and immediate needs within reach at end of session. Continue per current plan of care.     General   Pain Pain Assessment Pain Scale: 0-10 Pain Score: 0-No pain  Therapy/Group: Individual Therapy  Patty Sermons 09/24/2021, 12:27 PM

## 2021-09-24 NOTE — Op Note (Signed)
    NAME: James Wall    MRN: [id number] DOB: 1965/12/27    DATE OF OPERATION: 09/24/2021  PREOP DIAGNOSIS:    END-STAGE RENAL DISEASE  POSTOP DIAGNOSIS:    Same  PROCEDURE:    Placement of right IJ tunneled dialysis catheter (19 cm)  SURGEON: Judeth Cornfield. Scot Dock, MD  ASSIST: None  ANESTHESIA: General  EBL: Minimal  INDICATIONS:    Nissan Frazzini is a 56 y.o. male who had a previous right IJ catheter placed by [facility].  The catheter had been partially pulled out with an exposed cuff.  We were asked to replace the catheter.  FINDINGS:   Tip of the catheter was at the caval atrial junction.  TECHNIQUE:   The patient was taken to the operating room and received a general anesthetic.  The neck and upper chest were prepped and draped in usual sterile fashion.  Made a small transverse incision just above the clavicle on the right and here the old catheter was dissected free and clamped.  It was then divided and the distal aspect removed.  A new catheter was tunneled from the anterior chest to this IJ entrance site.  A wire was then passed to the old catheter and the old catheter was removed.  The dilator and peel-away sheath were advanced over the wire and the wire and dilator removed.  The cath was passed through the peel-away sheath and positioned at the cavoatrial junction.  Both ports withdrew easily with and flushed with heparinized saline and filled with concentrated heparin.  The catheter was secured at its exit site with a 3-0 nylon suture.  The IJ cannulation site was closed with a 4-0 Monocryl.  The old catheter site was closed with an interrupted 3-0 nylon.  Sterile dressing was applied.  Patient tolerated the procedure well was transferred to the recovery room in stable condition.  All needle and sponge counts were correct.   Deitra Mayo, MD, FACS [facility]  DATE OF DICTATION:   09/24/2021

## 2021-09-25 ENCOUNTER — Encounter (HOSPITAL_COMMUNITY): Payer: Self-pay | Admitting: Vascular Surgery

## 2021-09-25 LAB — CBC
HCT: 24.2 % — ABNORMAL LOW (ref 39.0–52.0)
Hemoglobin: 8 g/dL — ABNORMAL LOW (ref 13.0–17.0)
MCH: 33.9 pg (ref 26.0–34.0)
MCHC: 33.1 g/dL (ref 30.0–36.0)
MCV: 102.5 fL — ABNORMAL HIGH (ref 80.0–100.0)
Platelets: 173 10*3/uL (ref 150–400)
RBC: 2.36 MIL/uL — ABNORMAL LOW (ref 4.22–5.81)
RDW: 13.9 % (ref 11.5–15.5)
WBC: 6.3 10*3/uL (ref 4.0–10.5)
nRBC: 0 % (ref 0.0–0.2)

## 2021-09-25 LAB — RENAL FUNCTION PANEL
Albumin: 3.5 g/dL (ref 3.5–5.0)
Anion gap: 13 (ref 5–15)
BUN: 44 mg/dL — ABNORMAL HIGH (ref 6–20)
CO2: 28 mmol/L (ref 22–32)
Calcium: 10.6 mg/dL — ABNORMAL HIGH (ref 8.9–10.3)
Chloride: 97 mmol/L — ABNORMAL LOW (ref 98–111)
Creatinine, Ser: 11.04 mg/dL — ABNORMAL HIGH (ref 0.61–1.24)
GFR, Estimated: 5 mL/min — ABNORMAL LOW (ref >60)
Glucose, Bld: 106 mg/dL — ABNORMAL HIGH (ref 70–99)
Phosphorus: 5.5 mg/dL — ABNORMAL HIGH (ref 2.5–4.6)
Potassium: 4.7 mmol/L (ref 3.5–5.1)
Sodium: 138 mmol/L (ref 135–145)

## 2021-09-25 MED ORDER — OXYCODONE-ACETAMINOPHEN 5-325 MG PO TABS: 1.0000 | ORAL_TABLET | ORAL | Status: DC | PRN

## 2021-09-25 MED ORDER — UMECLIDINIUM-VILANTEROL 62.5-25 MCG/ACT IN AEPB
1.0000 | INHALATION_SPRAY | Freq: Every day | RESPIRATORY_TRACT | Status: DC | PRN
  Filled 2021-09-25: qty 14

## 2021-09-25 MED ORDER — HEPARIN SODIUM (PORCINE) 1000 UNIT/ML DIALYSIS
1500.0000 [IU] | INTRAMUSCULAR | Status: DC | PRN
  Filled 2021-09-25: qty 2

## 2021-09-25 MED ORDER — HEPARIN SODIUM (PORCINE) 1000 UNIT/ML DIALYSIS
1500.0000 [IU] | INTRAMUSCULAR | Status: DC | PRN
  Administered 2021-09-25: 1500 [IU] via INTRAVENOUS_CENTRAL
  Filled 2021-09-25 (×2): qty 2

## 2021-09-25 MED ORDER — HEPARIN SODIUM (PORCINE) 1000 UNIT/ML IJ SOLN
INTRAMUSCULAR | Status: AC
  Filled 2021-09-25: qty 4

## 2021-09-25 NOTE — Progress Notes (Signed)
PROGRESS NOTE   Subjective/Complaints:  Had GA for HD cath placement (first time per pt), no problems overnite or this am   ROS: Patient denies CP, SOB, N/V/D  Objective:   DG CHEST PORT 1 VIEW  Result Date: 09/24/2021 CLINICAL DATA:  Technologist notes state status post PICC placement. Radiologic records indicates exchange of dialysis catheter. EXAM: PORTABLE CHEST 1 VIEW COMPARISON:  Chest radiograph earlier today. FINDINGS: Right-sided dialysis catheter with tip overlying the mid SVC. No additional central line. No pneumothorax. Stable lung volumes. Mild bibasilar atelectasis. Unchanged cardiomegaly. Unchanged mediastinal contours. Aortic atherosclerosis and tortuosity. IMPRESSION: 1. Right-sided dialysis catheter with tip overlying the mid SVC. No pneumothorax. 2. Unchanged cardiomegaly.  Bibasilar atelectasis. Electronically Signed   By: Keith Rake M.D.   On: 09/24/2021 16:19   DG C-Arm 1-60 Min-No Report  Result Date: 09/24/2021 Fluoroscopy was utilized by the requesting physician.  No radiographic interpretation.   DG CHEST PORT 1 VIEW  Result Date: 09/23/2021 CLINICAL DATA:  End-stage renal disease EXAM: PORTABLE CHEST 1 VIEW COMPARISON:  08/08/2020 FINDINGS: Right IJ approach hemodialysis catheter terminates at the superior cavoatrial junction. Stable cardiomegaly. Aortic atherosclerosis. Mildly prominent bibasilar interstitial markings. No focal consolidation. No pleural effusion or pneumothorax. The visualized skeletal structures are unremarkable. IMPRESSION: Mildly prominent bibasilar interstitial markings, could reflect mild edema. Electronically Signed   By: Davina Poke D.O.   On: 09/23/2021 10:26    Recent Labs    09/23/21 1530 09/24/21 1408  WBC 3.4*  --   HGB 7.7* 9.2*  HCT 23.4* 27.0*  PLT 166  --     Recent Labs    09/23/21 1718 09/24/21 1408  NA 137 135  K 4.6 4.9  CL 96* 102  CO2 29  --    GLUCOSE 104* 89  BUN 49* 28*  CREATININE 12.78* 9.10*  CALCIUM 10.6*  --      Intake/Output Summary (Last 24 hours) at 09/25/2021 0801 Last data filed at 09/25/2021 0600 Gross per 24 hour  Intake 820 ml  Output 5 ml  Net 815 ml         Physical Exam: Vital Signs Blood pressure (!) 160/89, pulse (!) 58, temperature 98.4 F (36.9 C), temperature source Oral, resp. rate 17, height _0  (1.651 m), weight 80.3 kg, SpO2 100 %.   General: No acute distress Mood and affect are appropriate Heart: Regular rate and rhythm no rubs murmurs or extra sounds Lungs: Clear to auscultation, breathing unlabored, no rales or wheezes Abdomen: Positive bowel sounds, soft nontender to palpation, nondistended Extremities: No clubbing, cyanosis, or edema  Skin: No evidence of breakdown, no evidence of rash, HD cath site without erythema, clean, covered  Neurologic: Cranial nerves II through XII intact, motor strength is 5/5 in right  deltoid, bicep, tricep, grip, hip flexor, knee extensors, ankle dorsiflexor and plantar flexor 3- LUE, 4- LLE Sensory exam normal sensation to light touch and proprioception in bilateral upper and lower extremities  Musculoskeletal: Full range of motion in all 4 extremities. No joint swelling   Assessment/Plan: 1. Functional deficits which require 3+ hours per day of interdisciplinary therapy in a comprehensive inpatient rehab setting. Physiatrist is providing  close team supervision and 24 hour management of active medical problems listed below. Physiatrist and rehab team continue to assess barriers to discharge/monitor patient progress toward functional and medical goals  Care Tool:  Bathing    Body parts bathed by patient: Left arm, Chest, Abdomen, Front perineal area, Right upper leg, Left upper leg, Face   Body parts bathed by helper: Buttocks, Right arm, Right lower leg, Left lower leg     Bathing assist Assist Level: Moderate Assistance - Patient 50 -  74%     Upper Body Dressing/Undressing Upper body dressing   What is the patient wearing?: Pull over shirt    Upper body assist Assist Level: Contact Guard/Touching assist    Lower Body Dressing/Undressing Lower body dressing      What is the patient wearing?: Pants     Lower body assist Assist for lower body dressing: Moderate Assistance - Patient 50 - 74%     Toileting Toileting Toileting Activity did not occur (Clothing management and hygiene only): N/A (no void or bm)  Toileting assist Assist for toileting: Moderate Assistance - Patient 50 - 74%     Transfers Chair/bed transfer  Transfers assist  Chair/bed transfer activity did not occur: Safety/medical concerns  Chair/bed transfer assist level: Minimal Assistance - Patient > 75%     Locomotion Ambulation   Ambulation assist      Assist level: Supervision/Verbal cueing Assistive device: Cane-straight Max distance: 750 feet (total distance with 4 seated rest breaks)   Walk 10 feet activity   Assist     Assist level: Supervision/Verbal cueing Assistive device: Cane-straight   Walk 50 feet activity   Assist    Assist level: Supervision/Verbal cueing Assistive device: Cane-straight    Walk 150 feet activity   Assist    Assist level: Supervision/Verbal cueing Assistive device: Cane-straight    Walk 10 feet on uneven surface  activity   Assist     Assist level: Minimal Assistance - Patient > 75% Assistive device:  (handrail)   Wheelchair     Assist Is the patient using a wheelchair?: No             Wheelchair 50 feet with 2 turns activity    Assist            Wheelchair 150 feet activity     Assist          Blood pressure (!) 160/89, pulse (!) 58, temperature 98.4 F (36.9 C), temperature source Oral, resp. rate 17, height _0  (1.651 m), weight 80.3 kg, SpO2 100 %.  Medical Problem List and Plan: 1. 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  Recommendations of 30-day monitor             -patient may shower             -ELOS/Goals: 10/01/2021 supervision goals  -Continue CIR therapies including PT, OT, and SLP  Team conference today please see physician documentation under team conference tab, met with team  to discuss problems,progress, and goals. Formulized individual treatment plan based on medical history, underlying problem and comorbidities.  2.  Antithrombotics: -DVT/anticoagulation:  Mechanical: Antiembolism stockings, thigh (TED hose) Bilateral lower extremities             -antiplatelet therapy: Plavix 75 mg daily only.  DAPT held due to chronic anemia 3. Pain Management: N/A 4. Mood/Behavior/Sleep: Provide emotional support             -  antipsychotic agents: N/A 5. Neuropsych/cognition: This patient is capable of making decisions on his own behalf. 6. Skin/Wound Care: Routine skin checks 7. Fluids/Electrolytes/Nutrition: Routine in and outs with follow-up chemistries 8.  Hypertension.  Norvasc 10 mg daily, clonidine patch 0.3 mg weekly, hydralazine 25 mg twice daily, Isordil 20 mg daily, Lopressor 100 mg twice daily.   Vitals:   09/24/21 2141 09/25/21 0449  BP: 138/81 (!) 160/89  Pulse: (!) 56 (!) 58  Resp: 17 17  Temp: 98 F (36.7 C) 98.4 F (36.9 C)  SpO2: 95% 100%    BP fair to good,  management per nephrology   9.  End-stage renal disease.  Hemodialysis as directed per renal services             -HD at end of day to allow participation in therapies  -HD catheter replaced per [facility] yesterday  10.  Hyperlipidemia.  Crestor 11.  Nonalcoholic cirrhosis with ascites.  Paracentesis 2014-2015. 12.  History of prostate cancer.  Followed outpatient by Dr. Gloriann Loan and recently completed radiation therapy.  Continue Flomax 13.  History of first-degree AV block.  No chest pain or shortness of breath 14.  Tobacco abuse.  Counseling 15.  COPD.  OSA.  Patient no longer on  CPAP. 16.  Chronic anemia.    IV iron weekly with ferric per hemodialysis, transfuse if < 7.0 , consider  if pt becomes orthostatic or tachy in therapy .     Latest Ref Rng & Units 09/24/2021    2:08 PM 09/23/2021    3:30 PM 09/20/2021    2:53 PM  CBC  WBC 4.0 - 10.5 K/uL  3.4  3.6   Hemoglobin 13.0 - 17.0 g/dL 9.2  7.7  8.0   Hematocrit 39.0 - 52.0 % 27.0  23.4  23.2   Platelets 150 - 400 K/uL  166  139       17.  Hx of GERD, took a tablet at home for this will start PPI 18.  Mild thrombocytopenia, no bleeding monitor plt count no sq heparin for DVT prophyllaxis   LOS: 9 days A FACE TO FACE EVALUATION WAS PERFORMED  Charlett Blake 09/25/2021, 8:01 AM

## 2021-09-25 NOTE — Progress Notes (Signed)
Occupational Therapy Weekly Progress Note  Patient Details  Name: Ihor Meinzer MRN: [id number] Date of Birth: 1965/09/21  Beginning of progress report period: September 17, 2021 End of progress report period: September 25, 2021  Today's Date: 09/25/2021 OT Individual Time: 8921-1941 OT Individual Time Calculation (min): 72 min    Patient has met 4 of 5 short term goals.    Patient continues to demonstrate the following deficits: decreased coordination and decreased motor planning and therefore will continue to benefit from skilled OT intervention to enhance overall performance with BADL, iADL, and Reduce care partner burden.  Patient progressing toward long term goals..  Continue plan of care.  OT Short Term Goals Week 1:  OT Short Term Goal 1 (Week 1): Pt will complete grooming standing at the sink with CGA for safety. OT Short Term Goal 1 - Progress (Week 1): Met OT Short Term Goal 2 (Week 1): Pt will complete UB dressing with supervision using hemi-techniques while seated. OT Short Term Goal 2 - Progress (Week 1): Met OT Short Term Goal 3 (Week 1): Pt will complete LB dressing with CGA for safety using hemi-techniques. OT Short Term Goal 3 - Progress (Week 1): Met OT Short Term Goal 4 (Week 1): Pt will complete toileting and toilet transfer with CGA for safety with LRAD as needed. OT Short Term Goal 4 - Progress (Week 1): Met OT Short Term Goal 5 (Week 1): Pt will complete tub-shower transfer using a tub-bench with CGA for safety and LRAD as needed. OT Short Term Goal 5 - Progress (Week 1): Not met Week 2: STG = LTG d/t ELOS  Skilled Therapeutic Interventions/Progress Updates:  Pt awake in recliner upon OT arrival to the room. Pt reports, "I only take sponge baths at home because of the catheter." Pt in agreement for OT session.  Therapy Documentation Precautions:  Precautions Precautions: Fall Precaution Comments: L hemi; emotional lability; vision impairments (needs  further testing to determine extent) Restrictions Weight Bearing Restrictions: No Vital Signs: Please see "Flowsheet" for most recent vitals charted by nursing staff.  Pain: Pain Assessment Pain Scale: 0-10 Pain Score: 0-No pain  ADL: ADL Grooming: Supervision/safety (Pt able to complete oral care and shaving of face, chest, and abdomin while standing at the sink with close supervision for safety. Pt requires increased time to complete task d/t only being able to use RUE for task.) Where Assessed-Grooming: Standing at sink Upper Body Dressing: Setup (Pt able to doff a pull-over style shirt with set-up assist while sitting and supervision while standing to don.) Where Assessed-Upper Body Dressing: Chair ADL Comments: Pt able to complete various ambulatory transfers from recliner > sink > bedside table to retrieve lotion > sink > recliner with close supervision - CGA for safety with no AD. Pt requires significantly increased time to complete shaving due to only being able to safely use RUE for task and shaving various body parts. Pt able to adequately utilize hemi-techniques while completing the task. Pt able to perform grooming tasks standing at the sink with close supervision for safety, no LOB noted.  Therapeutic Activity: Education provided to pt on technique to improve fine motor coordination and strength. OT provided pt with yellow theraputty with pt encouraged to rollout and manipulate putty. Then, OT provided pt with a larger marker with ace wrap to improve grip to encourage pt to stamp the putty to improve L hand sustained grasp.    Pt requested to stay in the recliner at end of session. Pt  left sitting comfortably in the recliner with personal belongings and call light within reach, belt alarm placed and activated, sister present in room, and comfort needs attended to.   Therapy/Group: Individual Therapy  Barbee Shropshire 09/25/2021, 12:30 PM

## 2021-09-25 NOTE — Progress Notes (Signed)
Patient began cramping, declined offer to turn off UF, asked for mustard to help ease discomfort. Will continue to monitor and ensure UF is on if patient wants it on.

## 2021-09-25 NOTE — Progress Notes (Signed)
Vascular surgery will sign off at this time. Pt aware that should he want to pursue permanent HD access we are happy to see him in the outpt setting.   Broadus John MD

## 2021-09-25 NOTE — Progress Notes (Signed)
Speech Language Pathology Daily Session Note  Patient Details  Name: James Wall MRN: [id number] Date of Birth: May 18, 1965  Today's Date: 09/25/2021 SLP Individual Time: 0803-0905 SLP Individual Time Calculation (min): 62 min  Short Term Goals: Week 2: SLP Short Term Goal 1 (Week 2): STG=LTG due to ELOS  Skilled Therapeutic Interventions: Skilled ST treatment focused on cognitive goals. Pt was semi-reclined in bed on arrival and requested to get dressed for the morning. Pt picked out clothes with independence, donned brief with min A, and performed upper body dressing with CGA, lower body dressing with min A, and shoes with set-up A. Pt ambulated to sink using cane with CGA and brushed teeth with independence. Pt ambulated to wheelchair for remainder of session which was spent outside in courtyard area. SLP facilitated complex deductive reasoning and organization task with sup A fading to mod I for error awareness and repair x2. Pt demonstrated effective attention to task and verbal reasoning. Throughout session, pt demonstrated effective anticipatory awareness of needs at discharge, and verbalized ways in which he will be able to plan ahead to anticipate certain needs with mod I. Patient was passed off to PT at end of session.     Pain Pain Assessment Pain Scale: 0-10 Pain Score: 0-No pain  Therapy/Group: Individual Therapy  Patty Sermons 09/25/2021, 10:29 AM

## 2021-09-25 NOTE — Progress Notes (Signed)
Physical Therapy Session Note  Patient Details  Name: James Wall MRN: [id number] Date of Birth: 28-Sep-1965  Today's Date: 09/25/2021 PT Individual Time: 0905-1000 PT Individual Time Calculation (min): 55 min   Short Term Goals: Week 1:  PT Short Term Goal 1 (Week 1): Pt will perform bed mobility with CGA. PT Short Term Goal 1 - Progress (Week 1): Met PT Short Term Goal 2 (Week 1): Pt will perform bed to chair transfer with CGA. PT Short Term Goal 2 - Progress (Week 1): Met PT Short Term Goal 3 (Week 1): Pt will ambulate 150' with CGA and LRAD. PT Short Term Goal 3 - Progress (Week 1): Met PT Short Term Goal 4 (Week 1): Pt will improved Berg by MCID. PT Short Term Goal 4 - Progress (Week 1): Met Week 2:  PT Short Term Goal 1 (Week 2): STG=LTG due to LOS Week 3:     Skilled Therapeutic Interventions/Progress Updates:   Pt received sitting in WC and agreeable to PT.   Gait training with SPC through rehab unit 110f x 2.    Nustep. 6 min + 3 min with therapeutic rest breaks between bouts  Dynamic balance training to step over 5 inch obstacles 3 x 3 with seated rest break between bouts. Walking up/down ramp and through mulch. Lateral stepping R and L over 2inch obstacle on floor.   Sit<>stand Transfers performed with supervision assist from PT.        Therapy Documentation Precautions:  Precautions Precautions: Fall Precaution Comments: L hemi; emotional lability; vision impairments (needs further testing to determine extent) Restrictions Weight Bearing Restrictions: No General:   Vital Signs:  Pain: Pain Assessment Pain Scale: 0-10 Pain Score: 0-No pain Mobility:   Locomotion :    Trunk/Postural Assessment :    Balance:   Exercises:   Other Treatments:      Therapy/Group: Individual Therapy  ALorie Phenix8/23/2023, 10:12 AM

## 2021-09-25 NOTE — Patient Care Conference (Signed)
Inpatient RehabilitationTeam Conference and Plan of Care Update Date: 09/25/2021   Time: 10:04 AM    Patient Name: James Wall [facility]      Medical Record Number: [id number]  Date of Birth: [birth year] Sex: Male         Room/Bed: 4M05C/4M05C-01 Payor Info: Payor: MEDICARE / Plan: MEDICARE PART A AND B / Product Type: *No Product type* /    Admit Date/Time:  09/16/2021  5:59 PM  Primary Diagnosis:  CVA (cerebral vascular accident) [facility])  Hospital Problems: Principal Problem:   CVA (cerebral vascular accident) [facility]) Active Problems:   Right middle cerebral artery stroke [facility])    Expected Discharge Date: Expected Discharge Date: 10/01/21  Team Members Present: Physician leading conference: Dr. Alysia Penna Social Worker Present: Loralee Pacas, [place] Nurse Present: Dorien Chihuahua, RN PT Present: Barrie Folk, PT OT Present: Willeen Cass, OT SLP Present: Sherren Kerns, SLP PPS Coordinator present : Gunnar Fusi, SLP     Current Status/Progress Goal Weekly Team Focus  Bowel/Bladder     HD: M-W-F, anuric, LBM 09/20/21   Constipation addressed   Laxatives prn  Swallow/Nutrition/ Hydration             ADL's   CGA - close supervision for all ADLs and transfers  supervision overall  dynamic standing balance, hemi-dressing techniques, pt and family education   Mobility   supervision assist bed mobility. CGA-supervision assist for gait with SPC.  Mod I bed mobility and trasnfers and gait with LRAD.  balance, endurance. transfers. gait. neuromoto re-ed. education for safe D/c   Communication   ind  Ind  goals met - speech likely at baseline. No longer addressing   Safety/Cognition/ Behavioral Observations  sup A  mod I memory, sup A prob solving, awareness  memory strategies, error awareness, complex problem solving   Pain     N/a        Skin     HD catheter dislodged   HD cath intact and functioning   MD/nephrology revised HD catheter    Discharge Planning:   Patient has supervision/MOD I goals. Discharging home with his sister and friend able to provide intermittent assistance. HD: M,W, F. 1 step to enter the home.   Team Discussion: Patient doing better; continues to work on complex problem solving and memory. Patient on target to meet rehab goals: yes, currently needs CGA - supervision for ADLs and mobility using a SPC. Mod I -supervision goals set for discharge.  *See Care Plan and progress notes for long and short-term goals.   Revisions to Treatment Plan:  E-stim UE  Teaching Needs: Safety, medications, dietary modifications, transfers,etc  Current Barriers to Discharge: Decreased caregiver support, Home enviroment access/layout, and Hemodialysis  Possible Resolutions to Barriers: Family education HH follow up services (PT, OT and SLP)     Medical Summary Current Status: Blood pressure still labile but overall improving.  Underwent placement of new hemodialysis cath due to migration of prior catheter  Barriers to Discharge: Hemodialysis;Other (comments)  Barriers to Discharge Comments: Vascular access Possible Resolutions to Barriers/Weekly Focus: Nephro to assist with hemodialysis management, vascular surgery to assist with vascular access, continue blood pressure management   Continued Need for Acute Rehabilitation Level of Care: The patient requires daily medical management by a physician with specialized training in physical medicine and rehabilitation for the following reasons: Direction of a multidisciplinary physical rehabilitation program to maximize functional independence : Yes Medical management of patient stability for increased activity during participation in an intensive  rehabilitation regime.: Yes Analysis of laboratory values and/or radiology reports with any subsequent need for medication adjustment and/or medical intervention. : Yes   I attest that I was present, lead the team conference, and concur with the  assessment and plan of the team.   Margarito Liner 09/25/2021, 4:32 PM

## 2021-09-25 NOTE — Progress Notes (Signed)
[facility] Progress Note  Subjective: seen in room, no c/o's today.   Vitals:   09/24/21 2057 09/24/21 2101 09/24/21 2141 09/25/21 0449  BP:   138/81 (!) 160/89  Pulse: (!) 54 (!) 52 (!) 56 (!) 58  Resp:   17 17  Temp:   98 F (36.7 C) 98.4 F (36.9 C)  TempSrc:    Oral  SpO2:  100% 95% 100%  Weight:      Height:        Exam: Gen:NAD CVS:RRR Resp:normal WOB, CTA BL XYB:FXOV Ext: no sig edema b/l LEs Neuro: left sided weakness noted, awake/alert Dialysis access: RIJ TDC  OP HD orders: [place] MWF 4h  400/800  85kg  2/2 bath  P2  Heparin 4500 then 2067mdrun prn  RIJ TDC - sodium ferric gluconate 125 mg weekly IV, last 8/11 - doxercalciferol 4 ug IV tiw - last HD 8/11, post wt 85.1kg - last Hb 8.4 on 8/9, ferritin 318 on 7/19 - hep B labs done 8/16, non-immune   Assessment / Plan:  Subacute CVA -symptom onset 8/11, out of window for intervention. 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 HD today  HTN - resume BP home meds x 4, bp's good -UF 2l L as tolerated  CKD MBD -c/w renvela & renal diet  Anemia of CKD -not on ESAs due to prostate Ca -receiving Fe load (venofer allergy but has been tolerating Fe as an outpatient chronically and here as well) -transfuse prn for hgb <7  Prostate Ca -just completed XRT as an outpatient  RKelly Splinter MD 09/25/2021, 12:31 PM    Recent Labs  Lab 09/20/21 1453 09/23/21 1530 09/23/21 1718 09/24/21 1408  HGB 8.0* 7.7*  --  9.2*  ALBUMIN 3.2*  --  3.5  --   CALCIUM 10.3  --  10.6*  --   PHOS 4.8*  --  5.8*  --   CREATININE 10.02*  --  12.78* 9.10*  K 3.9  --  4.6 4.9   No results for input(s): "IRON", "TIBC", "FERRITIN" in the last 168 hours. Inpatient medications:  amLODipine  10 mg Oral QHS   Chlorhexidine Gluconate Cloth  6 each Topical Q12H   cloNIDine  0.3 mg Transdermal Q Sun   clopidogrel  75 mg Oral Daily   hydrALAZINE  25 mg Oral BID   isosorbide  dinitrate  20 mg Oral BID   metoprolol tartrate  100 mg Oral BID   pantoprazole  20 mg Oral Daily   rosuvastatin  10 mg Oral Daily   sevelamer carbonate  3,200 mg Oral TID WC   tamsulosin  0.4 mg Oral QPC supper    [START ON 09/26/2021] heparin, sodium chloride flush, umeclidinium-vilanterol

## 2021-09-26 ENCOUNTER — Ambulatory Visit: Payer: Medicare Other | Admitting: Family Medicine

## 2021-09-26 MED ORDER — FAMOTIDINE 20 MG PO TABS
10.0000 mg | ORAL_TABLET | Freq: Once | ORAL | Status: AC
Start: 2021-09-26 — End: 2021-09-26
  Administered 2021-09-26: 10 mg via ORAL
  Filled 2021-09-26: qty 1

## 2021-09-26 MED ORDER — CHLORHEXIDINE GLUCONATE CLOTH 2 % EX PADS
6.0000 | MEDICATED_PAD | Freq: Every day | CUTANEOUS | Status: DC
  Administered 2021-09-28 – 2021-09-29 (×2): 6 via TOPICAL

## 2021-09-26 NOTE — Progress Notes (Signed)
Physical Therapy Session Note  Patient Details  Name: James Wall MRN: [id number] Date of Birth: January 25, 1966  Today's Date: 09/26/2021 PT Individual Time: 0800-0826 and 1405-1500 PT Individual Time Calculation (min): 26 min and 55 min   Short Term Goals: Week 1:  PT Short Term Goal 1 (Week 1): Pt will perform bed mobility with CGA. PT Short Term Goal 1 - Progress (Week 1): Met PT Short Term Goal 2 (Week 1): Pt will perform bed to chair transfer with CGA. PT Short Term Goal 2 - Progress (Week 1): Met PT Short Term Goal 3 (Week 1): Pt will ambulate 150' with CGA and LRAD. PT Short Term Goal 3 - Progress (Week 1): Met PT Short Term Goal 4 (Week 1): Pt will improved Berg by MCID. PT Short Term Goal 4 - Progress (Week 1): Met Week 2:  PT Short Term Goal 1 (Week 2): STG=LTG due to LOS Week 3:     Skilled Therapeutic Interventions/Progress Updates:  Session 1  Pt received supine in bed and agreeable to PT. Supine>sit transfer without assist or cues from PT. Sitting balance to don shoes with set up assist only from PT. Gait training in hall with [facility] 2 [phone] with cues for improved gait speed. Standing balance to perform lower body dressing and oral hygiene at sink with supervision assist from PT throughout. Dynamic balance training with airex pad to perform front raise with 2# bar weight and lateral rotation with 2# bar weight x 10 each with supervision assist. [facility] to [facility] and all needs met.     Session 2   Pt received sitting in recliner and agreeable to PT. Pt performed sit<>stand transfer with supervision assist and no AD. Gait training with SPC to and from rehab gym with supervision assist for safety and cues for step length intermittently. .   Dynamic balance training standing on level and unlevel surface of airex pad and blue wedge. To perform ball catch and toss x 2 each with supervision assist for balance and cues for  improved attention to and use of LUE.    Step ladder  2 feet in each space x 2  Side stepping R and L x 2 bil 1 foot in each space. X 4  CGA -Min assist from PT for safety and improved weight shifting intermittently.   [place] level B with hand out provided: performed side stepping. Mini squat, tandem stance, SLS, reverse gait, figure 8. Performed all by figure 8 in parallal bars with intermittent UE support on rails and no AD for figure 8.    Pt returned to room and performed ambulatory transfer to bed with supervision assist and SPC. Sit>supine completed without assist, and left supine in bed with call bell in reach and all needs met.       Therapy Documentation Precautions:  Precautions Precautions: Fall Precaution Comments: L hemi; emotional lability; vision impairments (needs further testing to determine extent) Restrictions Weight Bearing Restrictions: No    Vital Signs: Therapy Vitals Temp: 97.9 F (36.6 C) Temp Source: Oral Pulse Rate: (!) 55 Resp: 18 BP: 129/72 Patient Position (if appropriate): Sitting Oxygen Therapy SpO2: 100 % O2 Device: Room Air Pain:  denies    Therapy/Group: Individual Therapy  ALorie Phenix8/24/2023, 4:35 PM

## 2021-09-26 NOTE — Progress Notes (Signed)
PROGRESS NOTE   Subjective/Complaints:  Still a little sore from HD cath replacement but no other c/os  ROS: Patient denies CP, SOB, N/V/D  Objective:   DG CHEST PORT 1 VIEW  Result Date: 09/24/2021 CLINICAL DATA:  Technologist notes state status post PICC placement. Radiologic records indicates exchange of dialysis catheter. EXAM: PORTABLE CHEST 1 VIEW COMPARISON:  Chest radiograph earlier today. FINDINGS: Right-sided dialysis catheter with tip overlying the mid SVC. No additional central line. No pneumothorax. Stable lung volumes. Mild bibasilar atelectasis. Unchanged cardiomegaly. Unchanged mediastinal contours. Aortic atherosclerosis and tortuosity. IMPRESSION: 1. Right-sided dialysis catheter with tip overlying the mid SVC. No pneumothorax. 2. Unchanged cardiomegaly.  Bibasilar atelectasis. Electronically Signed   By: Keith Rake M.D.   On: 09/24/2021 16:19   DG C-Arm 1-60 Min-No Report  Result Date: 09/24/2021 Fluoroscopy was utilized by the requesting physician.  No radiographic interpretation.    Recent Labs    09/23/21 1530 09/24/21 1408 09/25/21 1649  WBC 3.4*  --  6.3  HGB 7.7* 9.2* 8.0*  HCT 23.4* 27.0* 24.2*  PLT 166  --  173    Recent Labs    09/23/21 1718 09/24/21 1408 09/25/21 1649  NA 137 135 138  K 4.6 4.9 4.7  CL 96* 102 97*  CO2 29  --  28  GLUCOSE 104* 89 106*  BUN 49* 28* 44*  CREATININE 12.78* 9.10* 11.04*  CALCIUM 10.6*  --  10.6*     Intake/Output Summary (Last 24 hours) at 09/26/2021 0737 Last data filed at 09/26/2021 0732 Gross per 24 hour  Intake 480 ml  Output 1700 ml  Net -1220 ml         Physical Exam: Vital Signs Blood pressure 125/71, pulse (!) 56, temperature 98.6 F (37 C), temperature source Oral, resp. rate 18, height 5' 5"  (1.651 m), weight 80.3 kg, SpO2 99 %.   General: No acute distress Mood and affect are appropriate Heart: Regular rate and rhythm no  rubs murmurs or extra sounds Lungs: Clear to auscultation, breathing unlabored, no rales or wheezes Abdomen: Positive bowel sounds, soft nontender to palpation, nondistended Extremities: No clubbing, cyanosis, or edema  Skin: No evidence of breakdown, no evidence of rash, HD cath site without erythema, clean, covered with tegaderm Neurologic: Cranial nerves II through XII intact, motor strength is 5/5 in right  deltoid, bicep, tricep, grip, hip flexor, knee extensors, ankle dorsiflexor and plantar flexor 3- LUE, 4- LLE Sensory exam normal sensation to light touch and proprioception in bilateral upper and lower extremities  Musculoskeletal: Full range of motion in all 4 extremities. No joint swelling   Assessment/Plan: 1. Functional deficits which require 3+ hours per day of interdisciplinary therapy in a comprehensive inpatient rehab setting. Physiatrist is providing close team supervision and 24 hour management of active medical problems listed below. Physiatrist and rehab team continue to assess barriers to discharge/monitor patient progress toward functional and medical goals  Care Tool:  Bathing    Body parts bathed by patient: Left arm, Chest, Abdomen, Front perineal area, Right upper leg, Left upper leg, Face   Body parts bathed by helper: Buttocks, Right arm, Right lower leg, Left lower leg  Bathing assist Assist Level: Moderate Assistance - Patient 50 - 74%     Upper Body Dressing/Undressing Upper body dressing   What is the patient wearing?: Pull over shirt    Upper body assist Assist Level: Contact Guard/Touching assist    Lower Body Dressing/Undressing Lower body dressing      What is the patient wearing?: Pants     Lower body assist Assist for lower body dressing: Minimal Assistance - Patient > 75%     Toileting Toileting Toileting Activity did not occur (Clothing management and hygiene only): N/A (no void or bm)  Toileting assist Assist for toileting:  Moderate Assistance - Patient 50 - 74%     Transfers Chair/bed transfer  Transfers assist  Chair/bed transfer activity did not occur: Safety/medical concerns  Chair/bed transfer assist level: Minimal Assistance - Patient > 75%     Locomotion Ambulation   Ambulation assist      Assist level: Supervision/Verbal cueing Assistive device: Cane-straight Max distance: 750 feet (total distance with 4 seated rest breaks)   Walk 10 feet activity   Assist     Assist level: Supervision/Verbal cueing Assistive device: Cane-straight   Walk 50 feet activity   Assist    Assist level: Supervision/Verbal cueing Assistive device: Cane-straight    Walk 150 feet activity   Assist    Assist level: Supervision/Verbal cueing Assistive device: Cane-straight    Walk 10 feet on uneven surface  activity   Assist     Assist level: Minimal Assistance - Patient > 75% Assistive device:  (handrail)   Wheelchair     Assist Is the patient using a wheelchair?: No             Wheelchair 50 feet with 2 turns activity    Assist            Wheelchair 150 feet activity     Assist          Blood pressure 125/71, pulse (!) 56, temperature 98.6 F (37 C), temperature source Oral, resp. rate 18, height 5' 5"  (1.651 m), weight 80.3 kg, SpO2 99 %.  Medical Problem List and Plan: 1. 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  Recommendations of 30-day monitor             -patient may shower             -ELOS/Goals: 10/01/2021 supervision goals  -Continue CIR therapies including PT, OT, and SLP  2.  Antithrombotics: -DVT/anticoagulation:  Mechanical: Antiembolism stockings, thigh (TED hose) Bilateral lower extremities             -antiplatelet therapy: Plavix 75 mg daily only.  DAPT held due to chronic anemia 3. Pain Management: N/A 4. Mood/Behavior/Sleep: Provide emotional support             -antipsychotic  agents: N/A 5. Neuropsych/cognition: This patient is capable of making decisions on his own behalf. 6. Skin/Wound Care: Routine skin checks 7. Fluids/Electrolytes/Nutrition: Routine in and outs with follow-up chemistries 8.  Hypertension.  Norvasc 10 mg daily, clonidine patch 0.3 mg weekly, hydralazine 25 mg twice daily, Isordil 20 mg daily, Lopressor 100 mg twice daily.   Vitals:   09/25/21 2029 09/26/21 0429  BP: (!) 142/70 125/71  Pulse: 61 (!) 56  Resp: 18 18  Temp: 98.4 F (36.9 C) 98.6 F (37 C)  SpO2: 98% 99%    BP good, cont current meds   9.  End-stage renal disease.  Hemodialysis as directed per renal services             -HD at end of day to allow participation in therapies  -HD catheter replaced per [facility] yesterday  10.  Hyperlipidemia.  Crestor 11.  Nonalcoholic cirrhosis with ascites.  Paracentesis 2014-2015. 12.  History of prostate cancer.  Followed outpatient by Dr. Gloriann Loan and recently completed radiation therapy.  Continue Flomax 13.  History of first-degree AV block.  No chest pain or shortness of breath 14.  Tobacco abuse.  Counseling 15.  COPD.  OSA.  Patient no longer on CPAP. 16.  Chronic anemia.    IV iron weekly with ferric per hemodialysis, transfuse if < 7.0 , consider  if pt becomes orthostatic or tachy in therapy .     Latest Ref Rng & Units 09/25/2021    4:49 PM 09/24/2021    2:08 PM 09/23/2021    3:30 PM  CBC  WBC 4.0 - 10.5 K/uL 6.3   3.4   Hemoglobin 13.0 - 17.0 g/dL 8.0  9.2  7.7   Hematocrit 39.0 - 52.0 % 24.2  27.0  23.4   Platelets 150 - 400 K/uL 173   166     Hgb has been in 8-9 g range  17.  Hx of GERD, took a tablet at home for this will start PPI 18.  Mild thrombocytopenia, no bleeding monitor plt count no sq heparin for DVT prophyllaxis   LOS: 10 days A FACE TO FACE EVALUATION WAS PERFORMED  Charlett Blake 09/26/2021, 7:37 AM

## 2021-09-26 NOTE — Progress Notes (Signed)
Speech Language Pathology Daily Session Note  Patient Details  Name: James Wall MRN: [id number] Date of Birth: May 06, 1965  Today's Date: 09/26/2021 SLP Individual Time: 1003-1045 SLP Individual Time Calculation (min): 42 min  Short Term Goals: Week 2: SLP Short Term Goal 1 (Week 2): STG=LTG due to ELOS  Skilled Therapeutic Interventions: Skilled ST treatment focused on cognitive goals. SLP facilitated session by providing supervision verbal cues fading to mod I in regards to error awareness and verbal reasoning with novel card task. SLP also facilitated complex deductive reasoning puzzle with min A fading to sup A verbal cues which addressed organization, reasoning, and mental flexibility. Pt demonstrated appropriate sustained attention to task in a mildly distracting environment with minimal verbal redirection needed during these hands-on tasks. Patient was left in recliner with alarm activated and immediate needs within reach at end of session. Continue per current plan of care.      Pain Pain Assessment Pain Scale: 0-10 Pain Score: 0-No pain  Therapy/Group: Individual Therapy  Patty Sermons 09/26/2021, 10:43 AM

## 2021-09-26 NOTE — Progress Notes (Signed)
Speech Language Pathology Discharge Summary  Patient Details  Name: James Wall MRN: [id number] Date of Birth: 09-29-1965  Date of Discharge from SLP service:September 30, 2021  Today's Date: 09/30/2021 SLP Individual Time: 0815-0900 SLP Individual Time Calculation (min): 45 min   Skilled Therapeutic Interventions: S: Pt seen this date for skilled ST intervention targeting cognitive-linguistic goals outlined above. Pt received awake/alert and completing grooming at sink with RN providing supervision. No c/o pain. Agreeable to ST intervention in hospital room. Transferred from sink back to EOB with close supervision and use of cane.  O: SLP re-administered SLUMS with pt achieving a score of 23/30 (n=27 for individuals with HS diploma) this date, in comparison to a score of 22/30 on date of initial CIR evaluation on 09/17/2021. This score remains indicative of a mild cognitive impairment, which was present at time of evaluation. Functionally, pt with excellent recall of previous session events. Speech and language was fluent and intelligible. Recommend f/u ST intervention at next level of care (home health), though anticipate pt is nearing his cognitive baseline.  A: Pt has demonstrated stimulability for skilled ST intervention during CIR admission, and will likely continue to benefit from a short-course of ST intervention via [place] to insure ongoing safety at home given limited caregiver/family support.  P: Pt left EOB with with all safety measures activated. Call bell reviewed and within reach and all immediate needs met. Will discharge from [facility] to prepare for upcoming hospital discharge. Please see below for discharge summary.   Patient has met 4 of 4 long term goals.  Patient to discharge at overall Supervision;Modified Independent level.  Reasons goals not met:     Clinical Impression/Discharge Summary: Patient has made excellent gains and has met 4 of 4 long-term goals this admission  due to improved speech intelligibility and cognitive-linguistic skills in the areas of functional memory, complex problem solving, emergent and anticipatory awareness. Patient is currently completing functional and complex cognitive tasks with mod I-to-supervision level. Patient and family education is complete. Patient's care partner is independent to provide the necessary physical and cognitive assistance at discharge. Patient would benefit from continued SLP services in home health setting to maximize cognitive-linguistic function and functional independence. Anticipate pt is nearing cognitive baseline.   Care Partner:  Caregiver Able to Provide Assistance: Yes  Type of Caregiver Assistance: Physical;Cognitive  Recommendation:  Home Health SLP;Other (comment) (intermittent sup)  Rationale for SLP Follow Up: Maximize cognitive function and independence;Reduce caregiver burden   Equipment: NA   Reasons for discharge: Discharged from hospital;Treatment goals met   Patient/Family Agrees with Progress Made and Goals Achieved: Yes    Brianne T Garretson 09/26/2021, 5:01 PM

## 2021-09-26 NOTE — Progress Notes (Signed)
[facility] Progress Note  Subjective: seen in room, no c/o's today.   Vitals:   09/25/21 2027 09/25/21 2029 09/26/21 0429 09/26/21 1322  BP:  (!) 142/70 125/71 129/72  Pulse:  61 (!) 56 (!) 55  Resp: 18 18 18 18   Temp:  98.4 F (36.9 C) 98.6 F (37 C) 97.9 F (36.6 C)  TempSrc:   Oral Oral  SpO2:  98% 99% 100%  Weight:      Height:        Exam: Gen:NAD CVS:RRR Resp:normal WOB, CTA BL JSH:FWYO Ext: no sig edema b/l LEs Neuro: left sided weakness noted, awake/alert Dialysis access: RIJ TDC  OP HD orders: [place] MWF 4h  400/800  85kg  2/2 bath  P2  Heparin 4500 then 2067mdrun prn  RIJ TDC - sodium ferric gluconate 125 mg weekly IV, last 8/11 - doxercalciferol 4 ug IV tiw - last HD 8/11, post wt 85.1kg - last Hb 8.4 on 8/9, ferritin 318 on 7/19 - hep B labs done 8/16, non-immune   Assessment / Plan:  Subacute CVA -symptom onset 8/11, out of window for intervention. 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 HD tomorrow.   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 Inpatient medications:  amLODipine  10 mg Oral QHS   Chlorhexidine Gluconate Cloth  6 each Topical Q12H   cloNIDine  0.3 mg Transdermal Q Sun   clopidogrel  75 mg Oral Daily    hydrALAZINE  25 mg Oral BID   isosorbide dinitrate  20 mg Oral BID   metoprolol tartrate  100 mg Oral BID   pantoprazole  20 mg Oral Daily   rosuvastatin  10 mg Oral Daily   sevelamer carbonate  3,200 mg Oral TID WC   tamsulosin  0.4 mg Oral QPC supper    oxyCODONE-acetaminophen, sodium chloride flush, umeclidinium-vilanterol

## 2021-09-26 NOTE — Progress Notes (Signed)
Occupational Therapy Session Note  Patient Details  Name: James Wall MRN: [id number] Date of Birth: 1965-07-09  Today's Date: 09/26/2021 OT Individual Time: 1102-1212 OT Individual Time Calculation (min): 70 min    Short Term Goals: Week 2:  OT Short Term Goal 1 (Week 2): STG = LTG d/t ELOS  Skilled Therapeutic Interventions/Progress Updates:  Pt awake seated in recliner upon OT arrival to the room. Pt reports, "I just got past my highest level on this game right before you got here." Pt in agreement for OT session.  Therapy Documentation Precautions:  Precautions Precautions: Fall Precaution Comments: L hemi; emotional lability; vision impairments (needs further testing to determine extent) Restrictions Weight Bearing Restrictions: No Vital Signs: Please see "Flowsheet" for most recent vitals charted by nursing staff.  Pain: Pain Assessment Pain Scale: 0-10 Pain Score: 0-No pain  ADL: Pt declines need to perform ADL's at this time.   Therapeutic Activity - LUE Fine Motor Strengthening & Coordination: Pt participates in therapeutic activity in order to improve LUE fine motor strengthening and sustained grasp needed to perform ADLs/IADLs with improved independence and safety. Pt able to utilize a palmar/gross grasp on a marker with Ace Wrap for improved gasp to stamp yellow theraputty 20 x 2 trials to stamp the putty flat. Pt able to maintain a sustained gross grasp on the marker with L hand and perform elbow ext needed for stamping motion while seated in recliner at the table-top. OT encourages pt to perform various times today to improve LUE control & strength. Pt verbalizes understanding.   Therapeutic Activity - Functional Mobility, Visual Scanning, & LUE Neuro Re-Ed: Pt participates in functional ambulation in the hallway for a scavenger hunt to retrieve suction cup "Squiggs" that are placed on B sides on the hallway with pt using LUE to retrieve suction cup objects  with LUE using a gross grasp, shoulder flexion needed to reach for object, and retraction to pull off the surface with improved friction to improve gross & fine motor control. Pt initially requires minimal VC's initially to look to the L side, however, pt does not require cues after this initial task. Pt able to perform functional ambulation with close supervision using SPC and retrieve objects with close supervision. Pt able to ambulate from room <> elevator space (~150' x 2 trials) with only 1 small LOB that pt was able to self-correct using SPC.   IADL Simulation: Pt reports concerns about vacuuming at home. OT encouraged pt to participate in a simulates vacuuming activity in order to address these concerns and problem solve the task to improve safety while completing this IADL task after DC to home environment. Pt able to perform a simulated vacuuming activity (1-# weight attached to Swiffer mop) for ~5mn, 53 sec using RUE prior to experiencing increased fatigue and discomfort in back. Pt requires CGA to ensure standing balance. Pt performed task on a tile floor just to start with decreased friction to perform simulated activity. Pt reports minimal concerns with task after completing IADL simulation. However, pt reports that family can provide assistance with this if needed.   Pt requested to stay in the recliner at end of session. Pt left sitting comfortably in the recliner with personal belongings and call light within reach, belt alarm placed and activated, and comfort needs attended to.   Therapy/Group: Individual Therapy  HBarbee Shropshire8/24/2023, 12:24 PM

## 2021-09-27 DIAGNOSIS — J449 Chronic obstructive pulmonary disease, unspecified: Secondary | ICD-10-CM

## 2021-09-27 DIAGNOSIS — K219 Gastro-esophageal reflux disease without esophagitis: Secondary | ICD-10-CM

## 2021-09-27 LAB — RENAL FUNCTION PANEL
Albumin: 3.4 g/dL — ABNORMAL LOW (ref 3.5–5.0)
Anion gap: 10 (ref 5–15)
BUN: 37 mg/dL — ABNORMAL HIGH (ref 6–20)
CO2: 28 mmol/L (ref 22–32)
Calcium: 10 mg/dL (ref 8.9–10.3)
Chloride: 100 mmol/L (ref 98–111)
Creatinine, Ser: 8.41 mg/dL — ABNORMAL HIGH (ref 0.61–1.24)
GFR, Estimated: 7 mL/min — ABNORMAL LOW (ref >60)
Glucose, Bld: 99 mg/dL (ref 70–99)
Phosphorus: 3.9 mg/dL (ref 2.5–4.6)
Potassium: 4.3 mmol/L (ref 3.5–5.1)
Sodium: 138 mmol/L (ref 135–145)

## 2021-09-27 LAB — CBC
HCT: 23.3 % — ABNORMAL LOW (ref 39.0–52.0)
Hemoglobin: 7.9 g/dL — ABNORMAL LOW (ref 13.0–17.0)
MCH: 34.5 pg — ABNORMAL HIGH (ref 26.0–34.0)
MCHC: 33.9 g/dL (ref 30.0–36.0)
MCV: 101.7 fL — ABNORMAL HIGH (ref 80.0–100.0)
Platelets: 179 10*3/uL (ref 150–400)
RBC: 2.29 MIL/uL — ABNORMAL LOW (ref 4.22–5.81)
RDW: 13.7 % (ref 11.5–15.5)
WBC: 4.3 10*3/uL (ref 4.0–10.5)
nRBC: 0 % (ref 0.0–0.2)

## 2021-09-27 MED ORDER — HEPARIN SODIUM (PORCINE) 1000 UNIT/ML IJ SOLN
INTRAMUSCULAR | Status: AC
  Administered 2021-09-27: 1000 [IU]
  Filled 2021-09-27: qty 4

## 2021-09-27 MED ORDER — HEPARIN SODIUM (PORCINE) 1000 UNIT/ML DIALYSIS
2500.0000 [IU] | INTRAMUSCULAR | Status: DC | PRN
  Filled 2021-09-27: qty 3

## 2021-09-27 MED ORDER — CALCIUM CARBONATE ANTACID 500 MG PO CHEW
400.0000 mg | CHEWABLE_TABLET | Freq: Every day | ORAL | Status: DC | PRN
  Administered 2021-09-28 – 2021-09-30 (×3): 400 mg via ORAL
  Filled 2021-09-27 (×3): qty 2

## 2021-09-27 NOTE — Procedures (Signed)
   I was present at this dialysis session, have reviewed the session itself and made  appropriate changes Kelly Splinter MD [place] pager [phone]   09/27/2021, 3:22 PM

## 2021-09-27 NOTE — Progress Notes (Signed)
PROGRESS NOTE   Subjective/Complaints:  Had reflex yesterday after taking medications on empty stomach. It improved with pepcid.   ROS: Patient denies CP, SOB, N/V/D  Objective:   No results found.  Recent Labs    09/25/21 1649 09/27/21 1425  WBC 6.3 4.3  HGB 8.0* 7.9*  HCT 24.2* 23.3*  PLT 173 179    Recent Labs    09/25/21 1649 09/27/21 1425  NA 138 138  K 4.7 4.3  CL 97* 100  CO2 28 28  GLUCOSE 106* 99  BUN 44* 37*  CREATININE 11.04* 8.41*  CALCIUM 10.6* 10.0     Intake/Output Summary (Last 24 hours) at 09/27/2021 1617 Last data filed at 09/27/2021 1242 Gross per 24 hour  Intake 356 ml  Output --  Net 356 ml         Physical Exam: Vital Signs Blood pressure (!) 142/84, pulse (!) 55, temperature 98.1 F (36.7 C), temperature source Oral, resp. rate 18, height 5' 5"  (1.651 m), weight 80.3 kg, SpO2 98 %.   General: No acute distress Mood and affect are appropriate Heart: Regular rate and rhythm no rubs murmurs or extra sounds Lungs: Clear to auscultation, breathing unlabored, no rales or wheezes, normal rate Abdomen: Positive bowel sounds, soft nontender to palpation, nondistended Extremities: No clubbing, cyanosis, or edema RIJ TDC  Skin: No evidence of breakdown, no evidence of rash, clean, covered with tegaderm Neurologic: Cranial nerves II through XII intact, motor strength is 5/5 in right  deltoid, bicep, tricep, grip, hip flexor, knee extensors, ankle dorsiflexor and plantar flexor 3- LUE, 4- LLE Sensory exam normal sensation to light touch and proprioception in bilateral upper and lower extremities  Musculoskeletal: Full range of motion in all 4 extremities. No joint swelling   Assessment/Plan: 1. Functional deficits which require 3+ hours per day of interdisciplinary therapy in a comprehensive inpatient rehab setting. Physiatrist is providing close team supervision and 24 hour  management of active medical problems listed below. Physiatrist and rehab team continue to assess barriers to discharge/monitor patient progress toward functional and medical goals  Care Tool:  Bathing    Body parts bathed by patient: Left arm, Chest, Abdomen, Front perineal area, Right upper leg, Left upper leg, Face   Body parts bathed by helper: Buttocks, Right arm, Right lower leg, Left lower leg     Bathing assist Assist Level: Moderate Assistance - Patient 50 - 74%     Upper Body Dressing/Undressing Upper body dressing   What is the patient wearing?: Pull over shirt    Upper body assist Assist Level: Contact Guard/Touching assist    Lower Body Dressing/Undressing Lower body dressing      What is the patient wearing?: Pants     Lower body assist Assist for lower body dressing: Minimal Assistance - Patient > 75%     Toileting Toileting Toileting Activity did not occur (Clothing management and hygiene only): N/A (no void or bm)  Toileting assist Assist for toileting: Moderate Assistance - Patient 50 - 74%     Transfers Chair/bed transfer  Transfers assist  Chair/bed transfer activity did not occur: Safety/medical concerns  Chair/bed transfer assist level: Minimal Assistance -  Patient > 75%     Locomotion Ambulation   Ambulation assist      Assist level: Supervision/Verbal cueing Assistive device: Cane-straight Max distance: 750 feet (total distance with 4 seated rest breaks)   Walk 10 feet activity   Assist     Assist level: Supervision/Verbal cueing Assistive device: Cane-straight   Walk 50 feet activity   Assist    Assist level: Supervision/Verbal cueing Assistive device: Cane-straight    Walk 150 feet activity   Assist    Assist level: Supervision/Verbal cueing Assistive device: Cane-straight    Walk 10 feet on uneven surface  activity   Assist     Assist level: Minimal Assistance - Patient > 75% Assistive device:   (handrail)   Wheelchair     Assist Is the patient using a wheelchair?: No             Wheelchair 50 feet with 2 turns activity    Assist            Wheelchair 150 feet activity     Assist          Blood pressure (!) 142/84, pulse (!) 55, temperature 98.1 F (36.7 C), temperature source Oral, resp. rate 18, height 5' 5"  (1.651 m), weight 80.3 kg, SpO2 98 %.  Medical Problem List and Plan: 1. 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  Recommendations of 30-day monitor             -patient may shower             -ELOS/Goals: 10/01/2021 supervision goals  -Continue CIR therapies including PT, OT, and SLP  2.  Antithrombotics: -DVT/anticoagulation:  Mechanical: Antiembolism stockings, thigh (TED hose) Bilateral lower extremities             -antiplatelet therapy: Plavix 75 mg daily only.  DAPT held due to chronic anemia 3. Pain Management: N/A 4. Mood/Behavior/Sleep: Provide emotional support             -antipsychotic agents: N/A 5. Neuropsych/cognition: This patient is capable of making decisions on his own behalf. 6. Skin/Wound Care: Routine skin checks 7. Fluids/Electrolytes/Nutrition: Routine in and outs with follow-up chemistries 8.  Hypertension.  Norvasc 10 mg daily, clonidine patch 0.3 mg weekly, hydralazine 25 mg twice daily, Isordil 20 mg daily, Lopressor 100 mg twice daily.   Vitals:   09/27/21 1530 09/27/21 1600  BP: 136/82 (!) 142/84  Pulse: (!) 55 (!) 55  Resp: (!) 6 18  Temp:    SpO2: 96% 98%    BP overall good range, continue to monitor, avoid hypotension  9.  End-stage renal disease.  Hemodialysis as directed per renal services             -HD at end of day to allow participation in therapies  -HD catheter replaced per [facility]VS   -HD today 10.  Hyperlipidemia.  Crestor 11.  Nonalcoholic cirrhosis with ascites.  Paracentesis 2014-2015. 12.  History of prostate cancer.  Followed outpatient by  Dr. Gloriann Loan and recently completed radiation therapy.  Continue Flomax 13.  History of first-degree AV block.  No chest pain or shortness of breath 14.  Tobacco abuse.  Counseling 15.  COPD.  OSA.  Patient no longer on CPAP. Denies shortness of breath 16.  Chronic anemia.    IV iron weekly with ferric per hemodialysis, transfuse if < 7.0 , consider  if pt becomes orthostatic or tachy in therapy .  Latest Ref Rng & Units 09/27/2021    2:25 PM 09/25/2021    4:49 PM 09/24/2021    2:08 PM  CBC  WBC 4.0 - 10.5 K/uL 4.3  6.3    Hemoglobin 13.0 - 17.0 g/dL 7.9  8.0  9.2   Hematocrit 39.0 - 52.0 % 23.3  24.2  27.0   Platelets 150 - 400 K/uL 179  173      Hgb has been in 8-9 g range  17.  Hx of GERD, took a tablet at home for this will start PPI -Continue protonix, TUMS PRN 18.  Mild thrombocytopenia, no bleeding monitor plt count no sq heparin for DVT prophyllaxis   LOS: 11 days A FACE TO FACE EVALUATION WAS PERFORMED  Jennye Boroughs 09/27/2021, 4:17 PM

## 2021-09-27 NOTE — Progress Notes (Signed)
Patient ID: James Wall, male   DOB: [birth year], 56 y.o.   MRN: [id number]  SW met with pt in room to discuss HHA preference. He has no HHA preference.   SW sent HHPT/OT/SLP/aide referral to [facility].   Loralee Pacas, MSW, [place] Office: [phone] Cell: [phone] Fax: [phone]

## 2021-09-27 NOTE — Progress Notes (Signed)
Occupational Therapy Session Note  Patient Details  Name: James Wall MRN: [id number] Date of Birth: 11/15/1965  Today's Date: 09/27/2021 OT Individual Time: 0930-1030 OT Individual Time Calculation (min): 60 min    Short Term Goals: Week 2:  OT Short Term Goal 1 (Week 2): STG = LTG d/t ELOS  Skilled Therapeutic Interventions/Progress Updates:  Pt awake seated in the recliner upon OT arrival to the room. Pt reports, "You're late. It's 10:30." "Oh, nevermind! I'm just ready to go on my outing at 10:30!" Pt in agreement for OT session.  Therapy Documentation Precautions:  Precautions Precautions: Fall Precaution Comments: L hemi; emotional lability; vision impairments (needs further testing to determine extent) Restrictions Weight Bearing Restrictions: No Vital Signs: Please see "Flowsheet" for most recent vitals charted by nursing staff.  Pain: Pain Assessment Pain Scale: 0-10 Pain Score: 0-No pain  ADL: Pt declines need to perform ADL's at this time.    Functional Mobility: Pt participates in functional ambulation in order to improve endurance and ambulatory skills needed for safe mobility needed for DC to home. Pt able to perform ambulation from room <> day room (>150' x 2) using SPC with close supervision for safety. Pt demo's slow cadence, however, no LOB noted. Pt requires significantly increased time to ambulate from room <> day room dur to cadence speed. Pt reports no fatigue after this ambulation task.   Stroke Education: OT provides education to pt on FAST acronym for noticing stroke symptoms. OT provides stroke education on increased risks of having another stroke since pt has already had one. OT reviewed the meaning of FAST, techniques to check for symptoms, and reinforced the importance of calling 9-1-1 as quickly as possible for improved outcomes. Pt reports always having cell phone in the same room as pt. OT encouraged pt to practice using voice command system  on cell phone [name] or [employer]) to make calls in order to improve independence with using voice command in case pt's RUE is impacted by a stroke and pt is unable to control phone with L hand. Pt verbalizes understanding. OT provided handout on FAST brochure for pt to place on refrigerator or a commonplace in the home. Pt in agreement.   Pt requested to stay in the w/c at end of session. Pt left sitting comfortably in the w/c with personal belongings and call light within reach, belt alarm placed and activated, and comfort needs attended to.   Therapy/Group: Individual Therapy  Barbee Shropshire 09/27/2021, 12:48 PM

## 2021-09-27 NOTE — Progress Notes (Signed)
Physical Therapy Session Note  Patient Details  Name: James Wall MRN: [id number] Date of Birth: 1966-01-04  Today's Date: 09/27/2021 PT Individual Time: 1030-1155 PT Individual Time Calculation (min): 85 min   Short Term Goals: Week 1:  PT Short Term Goal 1 (Week 1): Pt will perform bed mobility with CGA. PT Short Term Goal 1 - Progress (Week 1): Met PT Short Term Goal 2 (Week 1): Pt will perform bed to chair transfer with CGA. PT Short Term Goal 2 - Progress (Week 1): Met PT Short Term Goal 3 (Week 1): Pt will ambulate 150' with CGA and LRAD. PT Short Term Goal 3 - Progress (Week 1): Met PT Short Term Goal 4 (Week 1): Pt will improved Berg by MCID. PT Short Term Goal 4 - Progress (Week 1): Met Week 2:  PT Short Term Goal 1 (Week 2): STG=LTG due to LOS  Skilled Therapeutic Interventions/Progress Updates:   Pt received sitting in WC and agreeable to PT  Transfers onto [employer] and shopping at [employer] with supervision assist from PT.   Patient returned to room and performed stand pivot to recliner with ***. Pt left sitting in recliner with call bell in reach and all needs met.        Therapy Documentation Precautions:  Precautions Precautions: Fall Precaution Comments: L hemi; emotional lability; vision impairments (needs further testing to determine extent) Restrictions Weight Bearing Restrictions: No General:   Vital Signs: Therapy Vitals Temp: 98.1 F (36.7 C) Temp Source: Oral Pulse Rate: (!) 55 Resp: 18 BP: (!) 142/84 Patient Position (if appropriate): Sitting Oxygen Therapy SpO2: 98 % O2 Device: Room Air Patient Activity (if Appropriate): In bed Pulse Oximetry Type: Continuous Pain:   Mobility:   Locomotion :    Trunk/Postural Assessment :    Balance:   Exercises:   Other Treatments:      Therapy/Group: Individual Therapy  [name] 09/27/2021, 4:30 PM

## 2021-09-27 NOTE — Progress Notes (Signed)
Physical Therapy Session Note  Patient Details  Name: Thorvald Orsino MRN: [id number] Date of Birth: 03/03/1965  Today's Date: 09/27/2021 PT Individual Time: 0810-0850 PT Individual Time Calculation (min): 40 min   Short Term Goals: Week 1:  PT Short Term Goal 1 (Week 1): Pt will perform bed mobility with CGA. PT Short Term Goal 1 - Progress (Week 1): Met PT Short Term Goal 2 (Week 1): Pt will perform bed to chair transfer with CGA. PT Short Term Goal 2 - Progress (Week 1): Met PT Short Term Goal 3 (Week 1): Pt will ambulate 150' with CGA and LRAD. PT Short Term Goal 3 - Progress (Week 1): Met PT Short Term Goal 4 (Week 1): Pt will improved Berg by MCID. PT Short Term Goal 4 - Progress (Week 1): Met Week 2:  PT Short Term Goal 1 (Week 2): STG=LTG due to LOS Week 3:     Skilled Therapeutic Interventions/Progress Updates:   Pt received sitting in WC and agreeable to PT  Gait training   Dynamic standing balance training.        Therapy Documentation Precautions:  Precautions Precautions: Fall Precaution Comments: L hemi; emotional lability; vision impairments (needs further testing to determine extent) Restrictions Weight Bearing Restrictions: No General:   Vital Signs:   Pain: Pain Assessment Pain Scale: 0-10 Pain Score: 0-No pain Mobility:   Locomotion :    Trunk/Postural Assessment :    Balance:   Exercises:   Other Treatments:      Therapy/Group: Individual Therapy  [name] 09/27/2021, 10:18 AM

## 2021-09-28 DIAGNOSIS — K5904 Chronic idiopathic constipation: Secondary | ICD-10-CM

## 2021-09-28 DIAGNOSIS — D649 Anemia, unspecified: Secondary | ICD-10-CM

## 2021-09-28 MED ORDER — SENNA 8.6 MG PO TABS
1.0000 | ORAL_TABLET | Freq: Every day | ORAL | Status: DC
  Administered 2021-09-28 – 2021-10-01 (×4): 8.6 mg via ORAL
  Filled 2021-09-28 (×4): qty 1

## 2021-09-28 NOTE — Progress Notes (Signed)
Physical Therapy Session Note  Patient Details  Name: James Wall MRN: [id number] Date of Birth: [birth year]  Today's Date: 09/28/2021 PT Individual Time: 1302-1400 PT Individual Time Calculation (min): 58 min   Short Term Goals: Week 1:  PT Short Term Goal 1 (Week 1): Pt will perform bed mobility with CGA. PT Short Term Goal 1 - Progress (Week 1): Met PT Short Term Goal 2 (Week 1): Pt will perform bed to chair transfer with CGA. PT Short Term Goal 2 - Progress (Week 1): Met PT Short Term Goal 3 (Week 1): Pt will ambulate 150' with CGA and LRAD. PT Short Term Goal 3 - Progress (Week 1): Met PT Short Term Goal 4 (Week 1): Pt will improved Berg by MCID. PT Short Term Goal 4 - Progress (Week 1): Met Week 2:  PT Short Term Goal 1 (Week 2): STG=LTG due to LOS  Skilled Therapeutic Interventions/Progress Updates:   Pt received sitting in recliner and agreeable to PT. Pt performed sit<>stand transfer with SPC and no assist from PT. Gait training through hall with supervision assist and SPC x 121f, 12107fand 7535fDynamic balance training to perform lateral reach x 12 with BUE and then toss to target performed while standing on red wedge with supervision assist for balance. Picking up bean bags from floor 2  x12 with intermittnet use of BUE.    Pt performed trail making on BITS level 1( 1 min with no errors) level 2 (2:16 min with 10 errors and 5 interuptions. Bell cancellation test no misses x 5 min.   Pt performed step up/down 1 steps x 8 BLE with seATED REST BREAK between bouts duet to fatigue. BUE supported on rails of stairs distant supervision assist from PT witih min cues for LUE position  Patient returned to room and performed stand pivot to recliner with SPC and no assist. Pt left sitting in recliner with call bell in reach and all needs met.        Therapy Documentation Precautions:  Precautions Precautions: Fall Precaution Comments: L hemi; emotional lability; vision  impairments (needs further testing to determine extent) Restrictions Weight Bearing Restrictions: No   Pain:  denies    Therapy/Group: Individual Therapy  AusLorie Phenix26/2023, 6:01 PM

## 2021-09-28 NOTE — Progress Notes (Signed)
PROGRESS NOTE   Subjective/Complaints:  Reflux has improved since he is taking medications with food. Denies feeling constipated. He reports mild chronic constipation and has BMs every 3-4 days at home typically.  Last BM 09/26/21  ROS: Patient denies CP, SOB, N/V/D/C  Objective:   No results found.  Recent Labs    09/25/21 1649 09/27/21 1425  WBC 6.3 4.3  HGB 8.0* 7.9*  HCT 24.2* 23.3*  PLT 173 179    Recent Labs    09/25/21 1649 09/27/21 1425  NA 138 138  K 4.7 4.3  CL 97* 100  CO2 28 28  GLUCOSE 106* 99  BUN 44* 37*  CREATININE 11.04* 8.41*  CALCIUM 10.6* 10.0     Intake/Output Summary (Last 24 hours) at 09/28/2021 0821 Last data filed at 09/28/2021 0717 Gross per 24 hour  Intake 298 ml  Output 1.5 ml  Net 296.5 ml         Physical Exam: Vital Signs Blood pressure (!) 151/80, pulse (!) 59, temperature 98.4 F (36.9 C), temperature source Oral, resp. rate 16, height 5' 5"  (1.651 m), weight 80.3 kg, SpO2 97 %.   General: No acute distress Mood and affect are appropriate Heart: Regular rate and rhythm no rubs murmurs or extra sounds Lungs: Clear to auscultation, breathing unlabored, no rales or wheezes, normal rate Abdomen: Positive bowel sounds, soft nontender to palpation, nondistended Extremities: No clubbing, cyanosis, or edema RIJ TDC  Skin: No evidence of breakdown, no evidence of rash, clean, covered with tegaderm Neurologic: Cranial nerves II through XII intact, motor strength is 5/5 in right  deltoid, bicep, tricep, grip, hip flexor, knee extensors, ankle dorsiflexor and plantar flexor 3- LUE, 4- LLE Sensory exam normal sensation to light touch and proprioception in bilateral upper and lower extremities  Musculoskeletal: Full range of motion in all 4 extremities. No joint swelling   Assessment/Plan: 1. Functional deficits which require 3+ hours per day of interdisciplinary therapy  in a comprehensive inpatient rehab setting. Physiatrist is providing close team supervision and 24 hour management of active medical problems listed below. Physiatrist and rehab team continue to assess barriers to discharge/monitor patient progress toward functional and medical goals  Care Tool:  Bathing    Body parts bathed by patient: Left arm, Chest, Abdomen, Front perineal area, Right upper leg, Left upper leg, Face   Body parts bathed by helper: Buttocks, Right arm, Right lower leg, Left lower leg     Bathing assist Assist Level: Moderate Assistance - Patient 50 - 74%     Upper Body Dressing/Undressing Upper body dressing   What is the patient wearing?: Pull over shirt    Upper body assist Assist Level: Contact Guard/Touching assist    Lower Body Dressing/Undressing Lower body dressing      What is the patient wearing?: Pants     Lower body assist Assist for lower body dressing: Minimal Assistance - Patient > 75%     Toileting Toileting Toileting Activity did not occur (Clothing management and hygiene only): N/A (no void or bm)  Toileting assist Assist for toileting: Moderate Assistance - Patient 50 - 74%     Transfers Chair/bed transfer  Transfers  assist  Chair/bed transfer activity did not occur: Safety/medical concerns  Chair/bed transfer assist level: Minimal Assistance - Patient > 75%     Locomotion Ambulation   Ambulation assist      Assist level: Supervision/Verbal cueing Assistive device: Cane-straight Max distance: 750 feet (total distance with 4 seated rest breaks)   Walk 10 feet activity   Assist     Assist level: Supervision/Verbal cueing Assistive device: Cane-straight   Walk 50 feet activity   Assist    Assist level: Supervision/Verbal cueing Assistive device: Cane-straight    Walk 150 feet activity   Assist    Assist level: Supervision/Verbal cueing Assistive device: Cane-straight    Walk 10 feet on uneven  surface  activity   Assist     Assist level: Minimal Assistance - Patient > 75% Assistive device:  (handrail)   Wheelchair     Assist Is the patient using a wheelchair?: No             Wheelchair 50 feet with 2 turns activity    Assist            Wheelchair 150 feet activity     Assist          Blood pressure (!) 151/80, pulse (!) 59, temperature 98.4 F (36.9 C), temperature source Oral, resp. rate 16, height 5' 5"  (1.651 m), weight 80.3 kg, SpO2 97 %.  Medical Problem List and Plan: 1. 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  Recommendations of 30-day monitor             -patient may shower             -ELOS/Goals: 10/01/2021 supervision goals  -Continue CIR therapies including PT, OT, and SLP  2.  Antithrombotics: -DVT/anticoagulation:  Mechanical: Antiembolism stockings, thigh (TED hose) Bilateral lower extremities             -antiplatelet therapy: Plavix 75 mg daily only.  DAPT held due to chronic anemia 3. Pain Management: N/A 4. Mood/Behavior/Sleep: Provide emotional support             -antipsychotic agents: N/A 5. Neuropsych/cognition: This patient is capable of making decisions on his own behalf. 6. Skin/Wound Care: Routine skin checks 7. Fluids/Electrolytes/Nutrition: Routine in and outs with follow-up chemistries 8.  Hypertension.  Norvasc 10 mg daily, clonidine patch 0.3 mg weekly, hydralazine 25 mg twice daily, Isordil 20 mg daily, Lopressor 100 mg twice daily.   Vitals:   09/27/21 1947 09/28/21 0552  BP: (!) 141/84 (!) 151/80  Pulse: (!) 51 (!) 59  Resp: 16 16  Temp: 97.8 F (36.6 C) 98.4 F (36.9 C)  SpO2: 99% 97%    BP stable overall, avoid hypotension  9.  End-stage renal disease.  Hemodialysis as directed per renal services             -HD at end of day to allow participation in therapies  -HD catheter replaced per [facility]VS   -HD MWF 10.  Hyperlipidemia.  Crestor 11.   Nonalcoholic cirrhosis with ascites.  Paracentesis 2014-2015. 12.  History of prostate cancer.  Followed outpatient by Dr. Gloriann Loan and recently completed radiation therapy.  Continue Flomax 13.  History of first-degree AV block.  No chest pain or shortness of breath 14.  Tobacco abuse.  Counseling 15.  COPD.  OSA.  Patient no longer on CPAP. Denies shortness of breath 16.  Chronic anemia.    IV iron weekly with ferric per  hemodialysis, transfuse if < 7.0 , consider  if pt becomes orthostatic or tachy in therapy .     Latest Ref Rng & Units 09/27/2021    2:25 PM 09/25/2021    4:49 PM 09/24/2021    2:08 PM  CBC  WBC 4.0 - 10.5 K/uL 4.3  6.3    Hemoglobin 13.0 - 17.0 g/dL 7.9  8.0  9.2   Hematocrit 39.0 - 52.0 % 23.3  24.2  27.0   Platelets 150 - 400 K/uL 179  173      Hgb stable at 7.9 8/25  17.  Hx of GERD, took a tablet at home for this will start PPI -Continue protonix, TUMS PRN 18.  Mild thrombocytopenia, no bleeding monitor plt count no sq heparin for DVT prophyllaxis  19. Constipation, chronic  -Will add senokot daily  LOS: 12 days A FACE TO FACE EVALUATION WAS PERFORMED  Jennye Boroughs 09/28/2021, 8:21 AM

## 2021-09-28 NOTE — Progress Notes (Signed)
Occupational Therapy Session Note  Patient Details  Name: James Wall MRN: [id number] Date of Birth: 1965/09/17  Today's Date: 09/28/2021 OT Individual Time: 6837-2902 OT Individual Time Calculation (min): 35 min    Short Term Goals: Week 1:  OT Short Term Goal 1 (Week 1): Pt will complete grooming standing at the sink with CGA for safety. OT Short Term Goal 1 - Progress (Week 1): Met OT Short Term Goal 2 (Week 1): Pt will complete UB dressing with supervision using hemi-techniques while seated. OT Short Term Goal 2 - Progress (Week 1): Met OT Short Term Goal 3 (Week 1): Pt will complete LB dressing with CGA for safety using hemi-techniques. OT Short Term Goal 3 - Progress (Week 1): Met OT Short Term Goal 4 (Week 1): Pt will complete toileting and toilet transfer with CGA for safety with LRAD as needed. OT Short Term Goal 4 - Progress (Week 1): Met OT Short Term Goal 5 (Week 1): Pt will complete tub-shower transfer using a tub-bench with CGA for safety and LRAD as needed. OT Short Term Goal 5 - Progress (Week 1): Not met Week 2:  OT Short Term Goal 1 (Week 2): STG = LTG d/t ELOS  Skilled Therapeutic Interventions/Progress Updates:    Patient seated upright on the bed chatting with family, pt indicated that he had shaved and bathe prior  to arrival.  The pt indicated that he rested well  with no pain at the time of OT treatment. The pt was able to transfer from bed LOF to w/c with CGA and additional time. The pt completed simulated task in UB dressing using medium grade theraband with performance at [facility] for UB and [place] for LB using 1 rest break. The pt was able to demonstrate functional mobility at w/c LOF incorporating his feet > 50 ft to the gym, he was able to catch a ball, incorporating BUE 3x with focus on extends his hand for adequate placement for grasping and transfer.  The pt was able to use a 1lb dowel in various planes with ModA for shld flexion, horizontal abduction and  shld shrugs to improve his BUE strength for greater independence during BADL related task performance. .  The pt remained at w/c LOF upon returning to his room with his alarm in place, his bedside table within reach, and his call light available.  The pt had no report of pain this treatment session.   Therapy Documentation Precautions:  Precautions Precautions: Fall Precaution Comments: L hemi; emotional lability; vision impairments (needs further testing to determine extent) Restrictions Weight Bearing Restrictions: No   Therapy/Group: Individual Therapy  Yvonne Kendall 09/28/2021, 4:00 PM

## 2021-09-29 MED ORDER — CHLORHEXIDINE GLUCONATE CLOTH 2 % EX PADS
6.0000 | MEDICATED_PAD | Freq: Every day | CUTANEOUS | Status: DC
  Administered 2021-09-29: 6 via TOPICAL

## 2021-09-29 MED ORDER — ACETAMINOPHEN 325 MG PO TABS
650.0000 mg | ORAL_TABLET | ORAL | Status: DC | PRN
  Administered 2021-09-29: 650 mg via ORAL
  Filled 2021-09-29 (×2): qty 2

## 2021-09-29 NOTE — Discharge Summary (Signed)
Physician Discharge Summary  Patient ID: James Wall MRN: [id number] DOB/AGE: 06-21-1965 56 y.o.  Admit date: 09/16/2021 Discharge date: 10/01/2021  Discharge Diagnoses:  Principal Problem:   CVA (cerebral vascular accident) [facility]) Active Problems:   Right middle cerebral artery stroke (Central) Hypertension End-stage renal disease Hyperlipidemia Alcoholic cirrhosis with ascites History of prostate cancer History of first-degree AV block Tobacco abuse/COPD Chronic anemia GERD Chronic constipation  Discharged Condition: Stable  Significant Diagnostic Studies: DG CHEST PORT 1 VIEW  Result Date: 09/24/2021 CLINICAL DATA:  Technologist notes state status post PICC placement. Radiologic records indicates exchange of dialysis catheter. EXAM: PORTABLE CHEST 1 VIEW COMPARISON:  Chest radiograph earlier today. FINDINGS: Right-sided dialysis catheter with tip overlying the mid SVC. No additional central line. No pneumothorax. Stable lung volumes. Mild bibasilar atelectasis. Unchanged cardiomegaly. Unchanged mediastinal contours. Aortic atherosclerosis and tortuosity. IMPRESSION: 1. Right-sided dialysis catheter with tip overlying the mid SVC. No pneumothorax. 2. Unchanged cardiomegaly.  Bibasilar atelectasis. Electronically Signed   By: Keith Rake M.D.   On: 09/24/2021 16:19   DG C-Arm 1-60 Min-No Report  Result Date: 09/24/2021 Fluoroscopy was utilized by the requesting physician.  No radiographic interpretation.   DG CHEST PORT 1 VIEW  Result Date: 09/23/2021 CLINICAL DATA:  End-stage renal disease EXAM: PORTABLE CHEST 1 VIEW COMPARISON:  08/08/2020 FINDINGS: Right IJ approach hemodialysis catheter terminates at the superior cavoatrial junction. Stable cardiomegaly. Aortic atherosclerosis. Mildly prominent bibasilar interstitial markings. No focal consolidation. No pleural effusion or pneumothorax. The visualized skeletal structures are unremarkable. IMPRESSION: Mildly prominent  bibasilar interstitial markings, could reflect mild edema. Electronically Signed   By: Davina Poke D.O.   On: 09/23/2021 10:26   ECHOCARDIOGRAM COMPLETE  Result Date: 09/15/2021    ECHOCARDIOGRAM REPORT   Patient Name:   James Wall [facility] Date of Exam: 09/15/2021 Medical Rec #:  [id number]           Height:       65.0 in Accession #:    [id number]          Weight:       187.0 lb Date of Birth:  1965-12-17           BSA:          1.922 m Patient Age:    56 years            BP:           170/100 mmHg Patient Gender: M                   HR:           56 bpm. Exam Location:  Inpatient Procedure: 2D Echo Indications:    stroke  History:        Patient has prior history of Echocardiogram examinations, most                 recent 09/13/2020. COPD and end stage renal disease,                 Arrythmias:first degree heart block; Risk Factors:Hypertension                 and Sleep Apnea.  Sonographer:    Johny Chess RDCS Referring Phys: [place]  1. Left ventricular ejection fraction, by estimation, is 55 to 60%. The left ventricle has normal function. The left ventricle has no regional wall motion abnormalities. There is moderate concentric left ventricular hypertrophy. Left ventricular diastolic parameters are consistent  with Grade III diastolic dysfunction (restrictive). Elevated left atrial pressure.  2. Right ventricular systolic function is normal. The right ventricular size is normal. There is severely elevated pulmonary artery systolic pressure. The estimated right ventricular systolic pressure is 16.1 mmHg.  3. Left atrial size was severely dilated.  4. The mitral valve is normal in structure. Mild to moderate mitral valve regurgitation. No evidence of mitral stenosis.  5. Tricuspid valve regurgitation is mild to moderate.  6. The aortic valve is tricuspid. Aortic valve regurgitation is not visualized. No aortic stenosis is present.  7. The inferior vena cava is dilated in size with  <50% respiratory variability, suggesting right atrial pressure of 15 mmHg. Comparison(s): RVSP is worse than prior; stable LV function. FINDINGS  Left Ventricle: Left ventricular ejection fraction, by estimation, is 55 to 60%. The left ventricle has normal function. The left ventricle has no regional wall motion abnormalities. The left ventricular internal cavity size was normal in size. There is  moderate concentric left ventricular hypertrophy. Left ventricular diastolic parameters are consistent with Grade III diastolic dysfunction (restrictive). Elevated left atrial pressure. Right Ventricle: The right ventricular size is normal. No increase in right ventricular wall thickness. Right ventricular systolic function is normal. There is severely elevated pulmonary artery systolic pressure. The tricuspid regurgitant velocity is 4.36 m/s, and with an assumed right atrial pressure of 15 mmHg, the estimated right ventricular systolic pressure is 09.6 mmHg. Left Atrium: Left atrial size was severely dilated. Right Atrium: Right atrial size was normal in size. Pericardium: Trivial pericardial effusion is present. Mitral Valve: The mitral valve is normal in structure. Mild mitral annular calcification. Mild to moderate mitral valve regurgitation. No evidence of mitral valve stenosis. Tricuspid Valve: The tricuspid valve is normal in structure. Tricuspid valve regurgitation is mild to moderate. No evidence of tricuspid stenosis. Aortic Valve: The aortic valve is tricuspid. Aortic valve regurgitation is not visualized. No aortic stenosis is present. Pulmonic Valve: The pulmonic valve was normal in structure. Pulmonic valve regurgitation is not visualized. No evidence of pulmonic stenosis. Aorta: The aortic root and ascending aorta are structurally normal, with no evidence of dilitation. Venous: The inferior vena cava is dilated in size with less than 50% respiratory variability, suggesting right atrial pressure of 15 mmHg.  IAS/Shunts: No atrial level shunt detected by color flow Doppler.  LEFT VENTRICLE PLAX 2D LVIDd:         5.30 cm Diastology LVIDs:         3.70 cm LV e' medial:    5.33 cm/s LV PW:         1.40 cm LV E/e' medial:  23.8 LV IVS:        1.20 cm LV e' lateral:   4.35 cm/s                        LV E/e' lateral: 29.2  RIGHT VENTRICLE             IVC RV S prime:     11.90 cm/s  IVC diam: 2.40 cm TAPSE (M-mode): 1.8 cm LEFT ATRIUM              Index        RIGHT ATRIUM           Index LA diam:        4.80 cm  2.50 cm/m   RA Area:     16.20 cm LA Vol (A2C):   100.0 ml 52.02  ml/m  RA Volume:   41.90 ml  21.80 ml/m LA Vol (A4C):   113.0 ml 58.78 ml/m LA Biplane Vol: 106.0 ml 55.14 ml/m  AORTIC VALVE LVOT Vmax:   94.00 cm/s LVOT Vmean:  59.500 cm/s LVOT VTI:    0.209 m  AORTA Ao Asc diam: 3.20 cm MITRAL VALVE                TRICUSPID VALVE MV Area (PHT): 3.42 cm     TR Peak grad:   76.0 mmHg MV Decel Time: 222 msec     TR Vmax:        436.00 cm/s MR Peak grad: 108.6 mmHg MR Mean grad: 77.0 mmHg     SHUNTS MR Vmax:      521.00 cm/s   Systemic VTI: 0.21 m MR Vmean:     416.0 cm/s MV E velocity: 127.00 cm/s MV A velocity: 59.50 cm/s MV E/A ratio:  2.13 Rudean Haskell MD Electronically signed by Rudean Haskell MD Signature Date/Time: 09/15/2021/2:42:29 PM    Final    CT ANGIO HEAD NECK W WO CM  Result Date: 09/14/2021 CLINICAL DATA:  Follow-up examination for stroke. EXAM: CT ANGIOGRAPHY HEAD AND NECK TECHNIQUE: Multidetector CT imaging of the head and neck was performed using the standard protocol during bolus administration of intravenous contrast. Multiplanar CT image reconstructions and MIPs were obtained to evaluate the vascular anatomy. Carotid stenosis measurements (when applicable) are obtained utilizing NASCET criteria, using the distal internal carotid diameter as the denominator. RADIATION DOSE REDUCTION: This exam was performed according to the departmental dose-optimization program which includes  automated exposure control, adjustment of the mA and/or kV according to patient size and/or use of iterative reconstruction technique. CONTRAST:  178m OMNIPAQUE IOHEXOL 350 MG/ML SOLN COMPARISON:  MRI from earlier the same day. FINDINGS: CT HEAD FINDINGS Brain: Atrophy with moderate chronic microvascular ischemic disease. Few identified right frontal infarct better seen on prior MRI. No other acute large vessel territory infarct. No intracranial hemorrhage. No mass lesion, midline shift or mass effect. No hydrocephalus or extra-axial fluid collection. Vascular: No hyperdense vessel. Calcified atherosclerosis present at skull base. Skull: Scalp soft tissues and calvarium within normal limits. Sinuses/Orbits: Globes orbital soft tissues within normal limits. Paranasal sinuses and mastoid air cells are largely clear. Other: None. Review of the MIP images confirms the above findings CTA NECK FINDINGS Aortic arch: Visualized aortic arch normal caliber with standard branch pattern. Moderate atheromatous change about the arch itself. No high-grade stenosis about the origin the great vessels. Right carotid system: Bulky calcified plaque about the right carotid bulb/proximal right ICA with associated stenosis of up to 40% by NASCET criteria. No dissection or vascular occlusion. Left carotid system: Or few calcified plaque about the left carotid bulb/proximal left ICA without hemodynamically significant stenosis. No dissection or vascular occlusion. Vertebral arteries: Left vertebral artery arises directly from the aortic arch. Right vertebral artery dominant. Extensive plaque about the proximal right vertebral artery with associated up to severe stenosis at the pre foraminal right V1 segment (a series 13, image 252). Vertebral arteries otherwise patent without stenosis or dissection. Skeleton: No visible discrete or worrisome osseous lesions. Moderate spondylosis present at C5-6. Other neck: Multiple prominent lymph nodes  seen within the lower left neck, measuring up to 1.1 cm at the left supraclavicular fossa. No other acute abnormality. Upper chest: Extensive mediastinal adenopathy is seen, largest of which measures up to 1.3 cm at prevascular space of the indeterminate. Layering bilateral pleural effusions with  evidence of pulmonary interstitial edema within the visualized lungs, suggesting CHF. Right-sided central venous catheter in place. Review of the MIP images confirms the above findings CTA HEAD FINDINGS Anterior circulation: Petrous segments patent bilaterally. Atheromatous change within the carotid siphons with up to moderate stenoses about the para clinoid segments bilaterally. A1 segments patent. Normal anterior communicating artery complex. Anterior cerebral arteries patent without stenosis. No M1 stenosis or occlusion. No proximal MCA branch occlusion. Distal MCA branches perfused and symmetric right Posterior circulation: Dominant right V4 segment widely patent. Right PICA grossly patent at its origin. Hypoplastic left vertebral artery terminates in PICA. Left PICA patent as well. Basilar patent to its distal aspect without stenosis. Superior cerebellar and posterior cerebral arteries patent bilaterally. Venous sinuses: Patent allowing for timing the contrast bolus. Anatomic variants: Hypoplastic left vertebral artery terminates in PICA. No intracranial aneurysm. Review of the MIP images confirms the above findings IMPRESSION: CT HEAD IMPRESSION: 1. No acute intracranial abnormality by CT. Previously identified right MCA distribution infarct better seen on prior MRI. No intracranial hemorrhage. 2. Atrophy with moderate chronic microvascular ischemic disease. CTA HEAD AND NECK IMPRESSION: 1. Negative CTA for large vessel occlusion or other emergent finding. 2. Bulky calcified plaque about the right carotid bulb/proximal right ICA with associated stenosis of up to 40% by NASCET criteria. 3. Atheromatous change about the  carotid siphons with up to moderate stenoses about the para clinoid segments bilaterally. 4. Extensive plaque about the proximal right vertebral artery with associated severe stenosis at the pre foraminal right V1 segment. Right vertebral artery dominant. Left vertebral artery hypoplastic and terminates in PICA, with no significant contribution to the posterior circulation. 5. Layering bilateral pleural effusions with pulmonary interstitial edema within the visualized lungs, suggesting CHF. 6. Prominent adenopathy within the lower left neck and visualized mediastinum. Findings are nonspecific, and could be reactive in nature. Possible nodal metastatic disease or lymphoproliferative disorder could also have this appearance. Electronically Signed   By: Jeannine Boga M.D.   On: 09/14/2021 23:48   MR BRAIN WO CONTRAST  Addendum Date: 09/14/2021   ADDENDUM REPORT: 09/14/2021 19:16 ADDENDUM: The acute infarct is in the right frontal lobe, not the left frontal lobe. Electronically Signed   By: San Morelle M.D.   On: 09/14/2021 19:16   Result Date: 09/14/2021 CLINICAL DATA:  Neuro deficit, acute, stroke suspected. Increased weakness, asymmetric on the left since Wednesday. EXAM: MRI HEAD WITHOUT CONTRAST TECHNIQUE: Multiplanar, multiecho pulse sequences of the brain and surrounding structures were obtained without intravenous contrast. COMPARISON:  Iron infusion yesterday. FINDINGS: Brain: Acute nonhemorrhagic infarct is demonstrated in the left precentral gyrus extending into the corona radiata. There is some involvement of more anterior frontal lobe as well. T2 and FLAIR changes are associated. Moderate atrophy and scattered periventricular subcortical T2 hyperintensities are present bilaterally separate from the area of acute/subacute infarct. Dilated perivascular spaces are present throughout the basal ganglia. The ventricles are of normal size. Remote lacunar infarct is present in the left  thalamus. The internal auditory canals are within normal limits. The brainstem and cerebellum are within normal limits. Vascular: Flow is present in the major intracranial arteries. Skull and upper cervical spine: The craniocervical junction is normal. Upper cervical spine is within normal limits. Marrow signal is unremarkable. Sinuses/Orbits: The paranasal sinuses and mastoid air cells are clear. The globes and orbits are within normal limits. IMPRESSION: 1. Acute nonhemorrhagic infarct involving the left precentral gyrus extending into the corona radiata and more anterior frontal lobe. 2.  Moderate atrophy and white matter disease likely reflects the sequela of chronic microvascular ischemia. 3. Remote lacunar infarct of the left thalamus. These results were called by telephone at the time of interpretation on 09/14/2021 at 6:27 pm to provider [facility] , who verbally acknowledged these results. Electronically Signed: By: San Morelle M.D. On: 09/14/2021 18:30    Labs:  Basic Metabolic Panel: Recent Labs  Lab 09/24/21 1408 09/25/21 1649 09/27/21 1425  NA 135 138 138  K 4.9 4.7 4.3  CL 102 97* 100  CO2  --  28 28  GLUCOSE 89 106* 99  BUN 28* 44* 37*  CREATININE 9.10* 11.04* 8.41*  CALCIUM  --  10.6* 10.0  PHOS  --  5.5* 3.9    CBC: Recent Labs  Lab 09/24/21 1408 09/25/21 1649 09/27/21 1425  WBC  --  6.3 4.3  HGB 9.2* 8.0* 7.9*  HCT 27.0* 24.2* 23.3*  MCV  --  102.5* 101.7*  PLT  --  173 179    CBG: No results for input(s): "GLUCAP" in the last 168 hours.  Family history.  Mother with hypertension father with CVA.  Negative for stomach cancer or rectal cancer esophageal or colon cancer  Brief HPI:   Shondell Poulson is a 56 y.o. right-handed male with history of chronic anemia, nonalcoholic cirrhosis as well as ascites, COPD/OSA no longer on CPAP, tobacco use, end-stage renal disease with hemodialysis since 2009 hypertension, prostate cancer followed by Dr. Lysle Morales,  first-degree AV block, hypertension, hyperlipidemia.  Per chart review lives alone.  He has SCAT to hemodialysis.  He has a sister and friends take him to the grocery store when needed.  Presented 09/14/2021 with left arm weakness as well as dysarthric speech 09/11/2021 and patient did not initially seek medical attention.  He was having a hard time holding his utensils.  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  Remote lacunar infarct of the left thalamus.  CT angiogram head and neck negative for large vessel occlusion.  Bulky calcified plaque about the right carotid bulb/proximal right ICA with associated stenosis of up to 40%.  Admission chemistries unremarkable except BUN 23 creatinine 7.52 alcohol negative hemoglobin 6.8.  Echocardiogram with ejection fraction of 55 to 60% no wall motion abnormalities grade 3 diastolic dysfunction.  Patient did not receive tPA.  Neurology follow-up maintained on Plavix for CVA prophylaxis.  DAPT held due to chronic anemia as well as reported intolerance to aspirin.  Renal service follow-up hemodialysis ongoing.  Therapy evaluations completed due to patient's left-sided weakness decreased functional mobility was admitted for a comprehensive rehab program.   Hospital Course: Zacarias Krauter was admitted to rehab 09/16/2021 for inpatient therapies to consist of PT, ST and OT at least three hours five days a week. Past admission physiatrist, therapy team and rehab RN have worked together to provide customized collaborative inpatient rehab.  Pertaining to patient's right frontal lobe corona radiata ischemic infarct.  Recommendations made for 30-day cardiac event monitor and cardiology services contacted.  Patient remained on Plavix alone.  DAPT held due to chronic anemia.  Blood pressure controlled on present regimen follow-up per outpatient and monitor closely with hemodialysis.   End-stage renal disease with hemodialysis as advised hemodialysis catheter placed per vascular surgery.  Crestor for hyperlipidemia.  History of nonalcoholic cirrhosis with ascites paracentesis 2014-2015.  History of prostate cancer followed outpatient by Dr. Gloriann Loan recently completed radiation course  maintained on Flomax.  History of first-degree AV block no chest pain or shortness of breath.  He did have a history of COPD tobacco use no longer on CPAP oxygen saturations maintained.  Chronic anemia latest hemoglobin 7.9 and monitored.  Patient asymptomatic.  History of GERD maintained on PPI.  Chronic constipation with laxative assistance as advised.   Blood pressures were monitored on TID basis and soft and monitored     Rehab course: During patient's stay in rehab weekly team conferences were held to monitor patient's progress, set goals and discuss barriers to discharge. At admission, patient required minimal assist 12 feet 1 person hand-held assist min mod assist sit to stand  Physical exam.  Blood pressure 184/88 pulse 54 temperature 98.8 respirations 15 oxygen saturations 100% room air Constitutional.  No acute distress HEENT Head.  Normocephalic and atraumatic Eyes.  Pupils round and reactive to light no discharge without nystagmus Neck.  Supple nontender no JVD without thyromegaly Cardiac regular rate and rhythm without any extra sounds or murmur heard Abdomen.  Soft nontender positive bowel sounds without rebound Respiratory effort normal no respiratory distress without wheeze Skin.  Warm and dry Neurologic.  Alert makes eye contact with examiner.  Speech dysarthric but intelligible.  Follows commands.  Fair insight and awareness.  Left upper extremity 2/5 proximal and trace distally.  Left lower extremity 2/5 proximal to distal.  No focal sensory findings.  He/She  has had improvement in activity tolerance, balance, postural control as well as ability to compensate for deficits. He/She  has had improvement in functional use RUE/LUE  and RLE/LLE as well as improvement in awareness.  Perform sit to stand transfer straight point cane no assist from therapy.  Ambulates to the hallway supervision straight point cane working with energy conservation.  Perform step up and down bilateral lower extremities supervision.  He is able to perform transfers from bed to LOF to wheelchair with contact-guard.  Completed simulated task and upper body dressing using medium grade Thera-Band with performance at min assist level moderate assist for lower body using 1 rest break.  Full family teaching completed plan discharge to home       Disposition: Discharge to home    Diet: Renal diet  Special Instructions: No driving smoking or alcohol  Continue hemodialysis as directed  Medications at discharge. 1.  Tylenol as needed 2.  Norvasc 10 mg p.o. nightly 3.  Clonidine patch 0.3 mg every 7 days 4.  Plavix 75 mg p.o. daily 5.  Hydralazine 25 mg p.o. twice daily 6.  Isordil 20 mg p.o. twice daily 7.  Lopressor 100 mg p.o. twice daily 8.  Protonix 20 mg p.o. daily 9.  Crestor 10 mg p.o. daily 10.  Senokot 1 tablet p.o. daily 11.  Renvela 3200 mg p.o. 3 times daily with meals 12.  Flomax 0.4 mg daily after supper 13.  Ellipta inhaler 1 puff daily as needed  30-35 minutes were spent completing discharge summary and discharge planning  Discharge Instructions     Ambulatory referral to Neurology   Complete by: As directed    An appointment is requested in approximately: 4 weeks right frontal lobe and corona radiata infarction   Ambulatory referral to Physical Medicine Rehab   Complete by: As directed    Moderate complexity follow-up 1 to 2 weeks right frontal lobe infarcts/corona radiata        Follow-up Information     Kirsteins, Luanna Salk, MD Follow up.   Specialty: Physical  Medicine and Rehabilitation Why: Office to call for appointment Contact information: [place] [address] [phone]         Lucas Mallow, MD Follow up.   Specialty: Urology Why: Call for appointment as needed Contact information: [place] [address] [phone]         Roney Jaffe, MD Follow up.   Specialty: Nephrology Why: Call for appointment Contact information: [place] [address] [phone]                 Signed: Cathlyn Parsons 10/01/2021, 5:26 AM

## 2021-09-29 NOTE — Progress Notes (Signed)
[facility] Progress Note  Subjective: seen in room, no c/o's today.   Vitals:   09/28/21 1243 09/28/21 2009 09/29/21 0443 09/29/21 0444  BP: (!) 131/94 (!) 165/84  (!) 142/80  Pulse: (!) 54 74 72 74  Resp: 16 14 14 14   Temp: 98.2 F (36.8 C) 98 F (36.7 C) 98.4 F (36.9 C) 98.4 F (36.9 C)  TempSrc:   Oral Oral  SpO2: 100% 96%  98%  Weight:      Height:        Exam: Gen:NAD CVS:RRR Resp:normal WOB, CTA BL CVE:LFYB Ext: no sig edema b/l LEs Neuro: left sided weakness noted, awake/alert Dialysis access: RIJ TDC  OP HD orders: [place] MWF 4h  400/800  85kg  2/2 bath  P2  Heparin 4500 then 2025mdrun prn  RIJ TDC - sodium ferric gluconate 125 mg weekly IV, last 8/11 - doxercalciferol 4 ug IV tiw - last HD 8/11, post wt 85.1kg - last Hb 8.4 on 8/9, ferritin 318 on 7/19 - hep B labs done 8/16, non-immune   Assessment / Plan:  Subacute CVA -symptom onset 8/11, out of window for intervention. 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 HD tomorrow.   HTN - resumed BP home meds x 4, bp's good - 5 kg under dry wt, euvolemic on exam, keep even next HD  CKD MBD - renal diet - CCa high, holding vdra - phos in range, cont renvela  Anemia of CKD - Hb 8-9 here -not on ESAs due to prostate Ca - has venofer allergy but has been tolerating Fe as an outpatient chronically and here as well; sp 250 mg x 2 on 8/16- 8/18 here then dc'd -transfuse prn for hgb <7  Prostate Ca -just completed XRT as an outpatient  RKelly Splinter MD 09/25/2021, 12:31 PM    Recent Labs  Lab 09/25/21 1649 09/27/21 1425  HGB 8.0* 7.9*  ALBUMIN 3.5 3.4*  CALCIUM 10.6* 10.0  PHOS 5.5* 3.9  CREATININE 11.04* 8.41*  K 4.7 4.3    No results for input(s): "IRON", "TIBC", "FERRITIN" in the last 168 hours. Inpatient medications:  amLODipine  10 mg Oral QHS   Chlorhexidine Gluconate Cloth  6 each Topical Q12H   Chlorhexidine Gluconate Cloth   6 each Topical Q0600   cloNIDine  0.3 mg Transdermal Q Sun   clopidogrel  75 mg Oral Daily   hydrALAZINE  25 mg Oral BID   isosorbide dinitrate  20 mg Oral BID   metoprolol tartrate  100 mg Oral BID   pantoprazole  20 mg Oral Daily   rosuvastatin  10 mg Oral Daily   senna  1 tablet Oral Daily   sevelamer carbonate  3,200 mg Oral TID WC   tamsulosin  0.4 mg Oral QPC supper    acetaminophen, calcium carbonate, sodium chloride flush, umeclidinium-vilanterol

## 2021-09-29 NOTE — Progress Notes (Signed)
Occupational Therapy Session Note  Patient Details  Name: James Wall MRN: [id number] Date of Birth: [birth year]  Today's Date: 09/29/2021 OT Individual Time: 0200-0245  9:20 -10:15 OT Individual Time Calculation (min): 45 min , 55 mins   Short Term Goals: Week 1:  OT Short Term Goal 1 (Week 1): Pt will complete grooming standing at the sink with CGA for safety. OT Short Term Goal 1 - Progress (Week 1): Met OT Short Term Goal 2 (Week 1): Pt will complete UB dressing with supervision using hemi-techniques while seated. OT Short Term Goal 2 - Progress (Week 1): Met OT Short Term Goal 3 (Week 1): Pt will complete LB dressing with CGA for safety using hemi-techniques. OT Short Term Goal 3 - Progress (Week 1): Met OT Short Term Goal 4 (Week 1): Pt will complete toileting and toilet transfer with CGA for safety with LRAD as needed. OT Short Term Goal 4 - Progress (Week 1): Met OT Short Term Goal 5 (Week 1): Pt will complete tub-shower transfer using a tub-bench with CGA for safety and LRAD as needed. OT Short Term Goal 5 - Progress (Week 1): Not met Week 2:  OT Short Term Goal 1 (Week 2): STG = LTG d/t ELOS  Skilled Therapeutic Interventions/Progress Updates:    OT session (1):  Patient seated at w/c LOF upon arrival, pt dressed and ready for treatment.  The pt was able to ambulate to the gym, > than 56f using his cane with CGA.  The pt completed 1.1 lb ball activity, transferring the ball from hand to hand with attention to extending the LUE and hand for grasping and extending the arm to transfer the ball to the opposite hand 2 sets of 15, the pt was able to take the ball and go into shld flexion  2 sets of 15 with rest breaks as needed. The pt was able to retrieve the ball with his hemiparetic LUE, grasp and carry the ball across midline and place it into a contain were it was retrieved using the RUE and place onto the left side of 2 sets of 15.  The pt completed a table top exercise  reaching for clothes pin exercise with focused on extension grasping,  and coordination of his hand movement for placing of  the clothes pins on the horizontal dowel, he was able to remove the clothes pins and return them to the container.  The pt then walked back to his living quarter using his cane for additional balance with his chair alarm in place, his bedside table within reach, and his call bell available.  The pt had no report of pain this treatment session.      OT session (2):  During this OT treatment session, the pt was able to walk from his room to the gym using his cane > 50 ft and  sit on the mat to complete a UB exercises using a 1lb dowel for shld flexion, horizontal abduction, shld rotation , 2 sets of 15 with 1 rest break between sets. The pt went on to complete ball  exercise, where he was able to return a ball that was tossed to him using a  1lb dowel  8x with rest breaks as needed, the pt required 2 rest breaks.  The pt was able to ambulate to his room from the gym using his cane, he returned to bed LOF with his alarm activated and his bedside table in place.  The pt had no complaint of  pain this treatment  session.   Therapy Documentation Precautions:  Precautions Precautions: Fall Precaution Comments: L hemi; emotional lability; vision impairments (needs further testing to determine extent) Restrictions Weight Bearing Restrictions: No  Therapy/Group: Individual Therapy  Yvonne Kendall 09/29/2021, 4:00 PM

## 2021-09-30 ENCOUNTER — Other Ambulatory Visit (HOSPITAL_COMMUNITY): Payer: Self-pay

## 2021-09-30 MED ORDER — CLOPIDOGREL BISULFATE 75 MG PO TABS
75.0000 mg | ORAL_TABLET | Freq: Every day | ORAL | 0 refills | Status: DC
  Filled 2021-09-30: qty 30, 30d supply, fill #0

## 2021-09-30 MED ORDER — HEPARIN SODIUM (PORCINE) 1000 UNIT/ML DIALYSIS: 2500.0000 [IU] | INTRAMUSCULAR | Status: DC | PRN

## 2021-09-30 MED ORDER — SEVELAMER CARBONATE 800 MG PO TABS
3200.0000 mg | ORAL_TABLET | Freq: Three times a day (TID) | ORAL | 0 refills | Status: DC
  Filled 2021-09-30: qty 208, 18d supply, fill #0

## 2021-09-30 MED ORDER — ROSUVASTATIN CALCIUM 10 MG PO TABS
10.0000 mg | ORAL_TABLET | Freq: Every day | ORAL | 0 refills | Status: DC
  Filled 2021-09-30: qty 30, 30d supply, fill #0

## 2021-09-30 MED ORDER — AMLODIPINE BESYLATE 10 MG PO TABS
10.0000 mg | ORAL_TABLET | Freq: Every day | ORAL | 0 refills | Status: DC
Start: 2021-09-30 — End: 2021-12-05
  Filled 2021-09-30: qty 30, 30d supply, fill #0

## 2021-09-30 MED ORDER — CLONIDINE 0.3 MG/24HR TD PTWK
0.3000 mg | MEDICATED_PATCH | TRANSDERMAL | 12 refills | Status: DC
  Filled 2021-09-30: qty 4, 28d supply, fill #0

## 2021-09-30 MED ORDER — PANTOPRAZOLE SODIUM 20 MG PO TBEC
20.0000 mg | DELAYED_RELEASE_TABLET | Freq: Every day | ORAL | 0 refills | Status: DC
  Filled 2021-09-30: qty 30, 30d supply, fill #0

## 2021-09-30 MED ORDER — ACETAMINOPHEN 325 MG PO TABS: 650.0000 mg | ORAL_TABLET | ORAL | Status: DC | PRN

## 2021-09-30 MED ORDER — SENNOSIDES-DOCUSATE SODIUM 8.6-50 MG PO TABS
2.0000 | ORAL_TABLET | Freq: Two times a day (BID) | ORAL | Status: DC
  Administered 2021-09-30 – 2021-10-01 (×2): 2 via ORAL
  Filled 2021-09-30 (×3): qty 2

## 2021-09-30 MED ORDER — HEPARIN SODIUM (PORCINE) 1000 UNIT/ML IJ SOLN
INTRAMUSCULAR | Status: AC
  Administered 2021-09-30: 2500 [IU]
  Filled 2021-09-30: qty 3

## 2021-09-30 MED ORDER — HEPARIN SODIUM (PORCINE) 1000 UNIT/ML IJ SOLN
INTRAMUSCULAR | Status: AC
  Administered 2021-09-30: 1000 [IU]
  Filled 2021-09-30: qty 3

## 2021-09-30 MED ORDER — HYDRALAZINE HCL 25 MG PO TABS
25.0000 mg | ORAL_TABLET | Freq: Two times a day (BID) | ORAL | 0 refills | Status: DC
  Filled 2021-09-30: qty 60, 30d supply, fill #0

## 2021-09-30 MED ORDER — HEPARIN SODIUM (PORCINE) 1000 UNIT/ML IJ SOLN
INTRAMUSCULAR | Status: AC
  Filled 2021-09-30: qty 4

## 2021-09-30 MED ORDER — METOPROLOL TARTRATE 100 MG PO TABS
100.0000 mg | ORAL_TABLET | Freq: Two times a day (BID) | ORAL | 0 refills | Status: DC
  Filled 2021-09-30: qty 60, 30d supply, fill #0

## 2021-09-30 MED ORDER — UMECLIDINIUM-VILANTEROL 62.5-25 MCG/ACT IN AEPB
1.0000 | INHALATION_SPRAY | Freq: Every day | RESPIRATORY_TRACT | 1 refills | Status: DC
  Filled 2021-09-30: qty 60, 30d supply, fill #0

## 2021-09-30 MED ORDER — ISOSORBIDE DINITRATE 20 MG PO TABS
20.0000 mg | ORAL_TABLET | Freq: Two times a day (BID) | ORAL | 0 refills | Status: DC
  Filled 2021-09-30: qty 60, 30d supply, fill #0

## 2021-09-30 MED ORDER — TAMSULOSIN HCL 0.4 MG PO CAPS
0.4000 mg | ORAL_CAPSULE | Freq: Every day | ORAL | 0 refills | Status: DC
  Filled 2021-09-30: qty 30, 30d supply, fill #0

## 2021-09-30 MED ORDER — SENNA 8.6 MG PO TABS: 1.0000 | ORAL_TABLET | Freq: Every day | ORAL | 0 refills | Status: DC

## 2021-09-30 NOTE — Progress Notes (Signed)
Contacted [facility] and spoke to [facility] advised that pt should d/c from rehab tomorrow and resume care on Wednesday.   Melven Sartorius Renal Navigator [phone]

## 2021-09-30 NOTE — Progress Notes (Signed)
Physical Therapy Discharge Summary  Patient Details  Name: James Wall MRN: [id number] Date of Birth: Apr 02, 1965  Date of Discharge from PT service:September 30, 2021  Today's Date: 09/30/2021 PT Individual Time: 0907-1020 PT Individual Time Calculation (min): 73 min    Patient has met 8 of 8 long term goals due to improved activity tolerance, improved balance, improved postural control, and functional use of  left upper extremity and left lower extremity.  Patient to discharge at an ambulatory level Modified Independent.   Patient's care partner is independent to provide the necessary physical assistance at discharge.  Reasons goals not met: n/a  Recommendation:  Patient will benefit from ongoing skilled PT services in home health setting to continue to advance safe functional mobility, address ongoing impairments in strength, coordination, balance, activity tolerance, cognition, safety awareness, and to minimize fall risk.  Equipment: No equipment provided  Reasons for discharge: treatment goals met and discharge from hospital  Patient/family agrees with progress made and goals achieved: Yes  PT Discharge Precautions/Restrictions Precautions Precautions: Fall Precaution Comments: L hemi; emotional lability; vision impairments (needs further testing to determine extent) Restrictions Weight Bearing Restrictions: No Vital Signs Therapy Vitals Temp: 98.1 F (36.7 C) Temp Source: Oral Pulse Rate: 60 Resp: 16 BP: (!) 165/99 Patient Position (if appropriate): Lying Oxygen Therapy SpO2: 100 % O2 Device: Room Air Pain Pain Assessment Pain Scale: 0-10 Pain Score: 0-No pain Pain Interference Pain Interference Pain Effect on Sleep: 1. Rarely or not at all Pain Interference with Therapy Activities: 1. Rarely or not at all Pain Interference with Day-to-Day Activities: 1. Rarely or not at all Vision/Perception  Vision - History Ability to See in Adequate Light: 0  Adequate Vision - Assessment Eye Alignment: Within Functional Limits Ocular Range of Motion: Within Functional Limits Alignment/Gaze Preference: Within Defined Limits Tracking/Visual Pursuits: Requires cues, head turns, or add eye shifts to track Convergence: Within functional limits Perception Perception: Within Functional Limits Praxis Praxis: Intact  Cognition Overall Cognitive Status: Within Functional Limits for tasks assessed Arousal/Alertness: Awake/alert Orientation Level: Oriented X4 Memory: Impaired Memory Impairment: Decreased short term memory Problem Solving: Impaired Behaviors: Lability Safety/Judgment: Appears intact Sensation Sensation Light Touch: Appears Intact Coordination Gross Motor Movements are Fluid and Coordinated: No Fine Motor Movements are Fluid and Coordinated: No Coordination and Movement Description: gross and fine motor deficits in LUE, RUE WFL Heel Shin Test: slow but able to perform bilaterally Motor  Motor Motor: Hemiplegia Motor - Discharge Observations: LUE weakness with mild LLE weakness affecting standing balance mildly  Mobility Bed Mobility Bed Mobility: Sit to Supine;Supine to Sit Supine to Sit: Independent with assistive device Sit to Supine: Independent with assistive device Transfers Transfers: Stand to Sit;Sit to Stand Sit to Stand: Independent with assistive device Stand to Sit: Independent with assistive device Transfer (Assistive device): None Locomotion  Gait Ambulation: Yes Gait Assistance: Independent with assistive device;Supervision/Verbal cueing Assistive device: Straight cane Gait Assistance Details: one minor LOB with pt able to self correct with step strategy during long distance ambulation Gait Gait: Yes Gait Pattern: Impaired Gait Pattern: Step-through pattern;Decreased stance time - left Gait velocity: decreased Stairs / Additional Locomotion Stairs: Yes Stairs Assistance: Supervision/Verbal  cueing Stair Management Technique: One rail Right Number of Stairs: 8 Height of Stairs: 6 Ramp: Supervision/Verbal cueing Curb: Supervision/Verbal cueing Wheelchair Mobility Wheelchair Mobility: No  Trunk/Postural Assessment  Cervical Assessment Cervical Assessment: Exceptions to [facility] (forward heaad) Thoracic Assessment Thoracic Assessment: Exceptions to [facility] (rounded shoulders) Lumbar Assessment Lumbar Assessment: Exceptions to [facility] (  post pelvic tilt) Postural Control Postural Control: Deficits on evaluation (slow reaction time)  Balance Balance Balance Assessed: Yes Standardized Balance Assessment Standardized Balance Assessment: Timed Up and Go Test;Dynamic Gait Index Dynamic Gait Index Level Surface: Mild Impairment Change in Gait Speed: Mild Impairment Gait with Horizontal Head Turns: Normal Gait with Vertical Head Turns: Mild Impairment Gait and Pivot Turn: Mild Impairment Step Over Obstacle: Mild Impairment Step Around Obstacles: Normal Steps: Mild Impairment Total Score: 18 Timed Up and Go Test TUG: Normal TUG Normal TUG (seconds): 18.7 Static Sitting Balance Static Sitting - Balance Support: Feet supported Static Sitting - Level of Assistance: 7: Independent Dynamic Sitting Balance Dynamic Sitting - Balance Support: Feet supported Dynamic Sitting - Level of Assistance: 7: Independent Static Standing Balance Static Standing - Balance Support: No upper extremity supported Static Standing - Level of Assistance: 6: Modified independent (Device/Increase time) Dynamic Standing Balance Dynamic Standing - Balance Support: Right upper extremity supported Dynamic Standing - Level of Assistance: 6: Modified independent (Device/Increase time) Extremity Assessment      RLE Assessment RLE Assessment: Within Functional Limits General Strength Comments: Grossly 4+/5 LLE Assessment LLE Assessment: Exceptions to [facility] General Strength Comments: Grossly 4- to 4/5  Skilled  Intervention: Patient seated on EOB on entrance to room. Patient alert and agreeable to PT session.   Patient with no pain complaint at start of session.  Therapeutic Activity: Transfers: Pt performed sit<>stand and stand pivot transfers throughout session with Mod I using SPC and without AD.  Car transfer completed with ModI and pt able to secure [facility] in seat next to him.   Gait Training/NMR:  Pt ambulated throughout therapy department entering anexiting each gym with minimal seated rest breaks. One minor LOB with fatigue but pt is able to self correct using stepping strategy.   Pt guided in stair training and is able to complete eight 6" steps using RHR with supervision/ mod I to ascend leading with RLE and supervision/ Mod I to descend leading with LLE. Pt performs with no vc for technique and steady ascent/ descent.  NMR facilitated during session with focus on standing balance, tolerance, and functional gait. Pt guided in DGI and TUG. DGI completed with improvement in score from 11 to 18. Scores</= 19 are predictive of falls in older community dwelling adults. Educated pt on scoring and significant improvement noted from eval.  TUG completed in avg of 18.7 sec without use of SPC. (A score of >13.5 seconds indicates patient is at a high fall risk. Pt educated on interpretation of their score).  NMR performed for improvements in motor control and coordination, balance, sequencing, judgement, and self confidence/ efficacy in performing all aspects of mobility at highest level of independence.   Patient seated upright in recliner at end of session with brakes locked, belt alarm set, and all needs within reach. Disc with OT to make pt Mod I in room d/t improved function prior to d/c.    Alger Simons PT, DPT, CSRS 09/30/2021, 5:36 PM

## 2021-09-30 NOTE — Progress Notes (Addendum)
Patient ID: James Wall, male   DOB: 11/02/1965, 56 y.o.   MRN: [id number]  09/28/2021-[facility] accepted HHPT/OT/SLP referral.  SW met with pt in room to inform on above.   *referral previously sent to [facility] on 8/18. SW informed Jennifer/[facility]. Order sent to [facility].   , MSW, LCSWA Office: [phone] Cell: [phone] Fax: [phone]  

## 2021-09-30 NOTE — Plan of Care (Signed)
  Problem: RH Grooming Goal: LTG Patient will perform grooming w/assist,cues/equip (OT) Description: LTG: Patient will perform grooming with assist, with/without cues using equipment (OT) Outcome: Completed/Met   Problem: RH Bathing Goal: LTG Patient will bathe all body parts with assist levels (OT) Description: LTG: Patient will bathe all body parts with assist levels (OT) Outcome: Completed/Met   Problem: RH Dressing Goal: LTG Patient will perform upper body dressing (OT) Description: LTG Patient will perform upper body dressing with assist, with/without cues (OT). Outcome: Completed/Met Goal: LTG Patient will perform lower body dressing w/assist (OT) Description: LTG: Patient will perform lower body dressing with assist, with/without cues in positioning using equipment (OT) Outcome: Completed/Met   Problem: RH Toileting Goal: LTG Patient will perform toileting task (3/3 steps) with assistance level (OT) Description: LTG: Patient will perform toileting task (3/3 steps) with assistance level (OT)  Outcome: Completed/Met   Problem: RH Functional Use of Upper Extremity Goal: LTG Patient will use RT/LT upper extremity as a (OT) Description: LTG: Patient will use right/left upper extremity as a stabilizer/gross assist/diminished/nondominant/dominant level with assist, with/without cues during functional activity (OT) Outcome: Completed/Met   Problem: RH Toilet Transfers Goal: LTG Patient will perform toilet transfers w/assist (OT) Description: LTG: Patient will perform toilet transfers with assist, with/without cues using equipment (OT) Outcome: Completed/Met

## 2021-09-30 NOTE — Progress Notes (Addendum)
Inpatient Rehabilitation Discharge Medication Review by a Pharmacist  A complete drug regimen review was completed for this patient to identify any potential clinically significant medication issues.  High Risk Drug Classes Is patient taking? 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    Other  PTA meds: Thorazine 10 mg po tid prn hiccups Hydroxyzine 25 mg po q8h prn itching Combivent Respimat 1 puff q6h prn wheezing Protonix 40 mf po bid w/ meals as needed for acid reflux (patient states he takes in this manner)     Clinically significant medication issues were identified that warrant physician communication and completion of prescribed/recommended actions by midnight of the next day:  No   Time spent performing this drug regimen review (minutes):  30   Alexander An 09/30/2021 8:44 AM  I discussed / reviewed the pharmacy note by Sheppard Coil An and I agree with the resident's findings and plans as documented.   Thank you Anette Guarneri, PharmD

## 2021-09-30 NOTE — Plan of Care (Signed)
  Problem: RH Balance Goal: LTG Patient will maintain dynamic sitting balance (PT) Description: LTG:  Patient will maintain dynamic sitting balance with assistance during mobility activities (PT) Outcome: Completed/Met Flowsheets (Taken 09/30/2021 1738) LTG: Pt will maintain dynamic sitting balance during mobility activities with:: Independent Goal: LTG Patient will maintain dynamic standing balance (PT) Description: LTG:  Patient will maintain dynamic standing balance with assistance during mobility activities (PT) Outcome: Completed/Met Flowsheets (Taken 09/30/2021 1738) LTG: Pt will maintain dynamic standing balance during mobility activities with:: Independent with assistive device    Problem: Sit to Stand Goal: LTG:  Patient will perform sit to stand with assistance level (PT) Description: LTG:  Patient will perform sit to stand with assistance level (PT) Outcome: Completed/Met Flowsheets (Taken 09/30/2021 1738) LTG: PT will perform sit to stand in preparation for functional mobility with assistance level: Independent with assistive device   Problem: RH Bed to Chair Transfers Goal: LTG Patient will perform bed/chair transfers w/assist (PT) Description: LTG: Patient will perform bed to chair transfers with assistance (PT). Outcome: Completed/Met Flowsheets (Taken 09/30/2021 1738) LTG: Pt will perform Bed to Chair Transfers with assistance level: Independent with assistive device    Problem: RH Car Transfers Goal: LTG Patient will perform car transfers with assist (PT) Description: LTG: Patient will perform car transfers with assistance (PT). Outcome: Completed/Met Flowsheets (Taken 09/17/2021 1809 by Breck Coons, PT) LTG: Pt will perform car transfers with assist:: Independent with assistive device    Problem: RH Ambulation Goal: LTG Patient will ambulate in controlled environment (PT) Description: LTG: Patient will ambulate in a controlled environment, # of feet with  assistance (PT). Outcome: Completed/Met Flowsheets Taken 09/30/2021 1738 by Alger Simons, PT LTG: Pt will ambulate in controlled environ  assist needed:: Independent with assistive device Taken 09/17/2021 1809 by Breck Coons, PT LTG: Ambulation distance in controlled environment: 150' Goal: LTG Patient will ambulate in home environment (PT) Description: LTG: Patient will ambulate in home environment, # of feet with assistance (PT). Outcome: Completed/Met Flowsheets (Taken 09/17/2021 1809 by Breck Coons, PT) LTG: Pt will ambulate in home environ  assist needed:: Independent with assistive device LTG: Ambulation distance in home environment: 50'   Problem: RH Stairs Goal: LTG Patient will ambulate up and down stairs w/assist (PT) Description: LTG: Patient will ambulate up and down # of stairs with assistance (PT) Outcome: Completed/Met Flowsheets (Taken 09/17/2021 1809 by Breck Coons, PT) LTG: Pt will ambulate up/down stairs assist needed:: Supervision/Verbal cueing LTG: Pt will  ambulate up and down number of stairs: 1 with RHR

## 2021-09-30 NOTE — Progress Notes (Signed)
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 HD today.   HTN - resumed BP home meds x 4, bp's good - 5 kg under dry wt, euvolemic on exam, keep even today  CKD MBD - renal diet - CCa high, holding vdra - phos in range, cont renvela  Anemia of CKD - Hb 8-9 here -not on ESAs due to prostate Ca - has venofer allergy but has been tolerating Fe as an outpatient chronically and here as well; sp 250 mg x 2 on 8/16- 8/18 here then dc'd -transfuse prn for hgb <7  Prostate Ca -just completed XRT as an outpatient  Subjective: seen in room, no c/o's today, denies sob, nausea. States he had low grade temp last night but denies chills.   Vitals:   09/29/21 1303 09/29/21 1933 09/29/21 2045 09/30/21 0532  BP: (!) 157/88 (!) 144/76  (!) 158/87  Pulse: (!) 55 (!) 53 62 (!) 55  Resp: 18 18  18   Temp: 97.8 F (36.6 C) 98 F (36.7 C)  97.9 F (36.6 C)  TempSrc:  Oral    SpO2: 100% 97%  100%  Weight:    81.3 kg  Height:        Exam: Gen:NAD CVS:RRR Resp:normal WOB, CTA BL ATDS:KAJGExt: no sig edema b/l LEs Neuro: left sided weakness noted, awake/alert [facility]  Recent Labs  Lab 09/25/21 1649 09/27/21 1425  HGB 8.0* 7.9*  ALBUMIN 3.5 3.4*  CALCIUM 10.6* 10.0  PHOS 5.5* 3.9  CREATININE 11.04* 8.41*  K 4.7 4.3   No results for input(s): "IRON", "TIBC", "FERRITIN" in the last 168 hours. Inpatient medications:  amLODipine  10 mg Oral QHS   Chlorhexidine  Gluconate Cloth  6 each Topical Q12H   Chlorhexidine Gluconate Cloth  6 each Topical Q0600   cloNIDine  0.3 mg Transdermal Q Sun   clopidogrel  75 mg Oral Daily   hydrALAZINE  25 mg Oral BID   isosorbide dinitrate  20 mg Oral BID   metoprolol tartrate  100 mg Oral BID   pantoprazole  20 mg Oral Daily   rosuvastatin  10 mg Oral Daily   senna  1 tablet Oral Daily   sevelamer carbonate  3,200 mg Oral TID WC   tamsulosin  0.4 mg Oral QPC supper    acetaminophen, calcium carbonate, sodium chloride flush, umeclidinium-vilanterol

## 2021-09-30 NOTE — Progress Notes (Signed)
Occupational Therapy Discharge Summary  Patient Details  Name: James Wall MRN: [id number] Date of Birth: 1965/09/28  Date of Discharge from OT service:September 30, 2021  Today's Date: 09/30/2021 OT Individual Time: 1045-1200 OT Individual Time Calculation (min): 75 min    Pt received in recliner fully dressed and ready for the day. He stated he was washed up earlier and he reports he was able to get himself dressed fully without A. Pt did not want to repeat that process this session. He stated he feels confident that he can accomplish his basic self care at home without A. Pt agreeable to walking to kitchen to discuss kitchen safety and cooking tips with use of 1 hand.  Pt used SPC to ambulate to kitchen from his room without A.   In kitchen, discussed and demonstrated various ideas that he could implement to prepare meals without relying on his LUE as he continues to have limited movement in his [facility].  Pt then ambulated to gym where we focused on his LUE AROM using B hands on dowel bar with ARROM for sh flexion and elb flex and ext. He is developing more control in his elbow.  Worked on [facility] of fingers focusing on full finger extension.  Gave pt a few exercises he can do at home when he is not in [facility] OT sessions. Pt ambulated back to his room. He is now mod I in his room. All needs met.  Patient has met 7 of 7 long term goals due to improved activity tolerance, improved balance, postural control, ability to compensate for deficits, functional use of  LEFT upper extremity, improved attention, improved awareness, and improved coordination.  Patient to discharge at overall Modified Independent level with basic self care but will need A with higher level ADLs of laundry, cooking, etc.  Patient's sister is independent to provide the necessary physical assistance at discharge on and intermittent basis.  Pt education only, no family ed with sister.   Reasons goals not met: n/a  Recommendation:   Patient will benefit from ongoing skilled OT services in home health setting to continue to advance functional skills in the area of iADL.  Equipment: No equipment provided  Reasons for discharge: treatment goals met  Patient/family agrees with progress made and goals achieved: Yes  OT Discharge Precautions/Restrictions  Precautions Precautions: Fall Restrictions Weight Bearing Restrictions: No  Pain Pain Assessment Pain Score: 0-No pain ADL ADL Eating: Independent Grooming: Independent Where Assessed-Grooming: Standing at sink Upper Body Bathing: Modified independent Where Assessed-Upper Body Bathing: Standing at sink Lower Body Bathing: Modified independent Where Assessed-Lower Body Bathing: Standing at sink, Sitting at sink Upper Body Dressing: Modified independent (Device) Where Assessed-Upper Body Dressing: Chair Lower Body Dressing: Modified independent Where Assessed-Lower Body Dressing: Edge of bed (& standing to pull clothing up) Toileting: Modified independent Where Assessed-Toileting: Glass blower/designer: Diplomatic Services operational officer Method: Counselling psychologist: Energy manager: Not assessed Social research officer, government: Not assessed ADL Comments: Pt states he only sponge bathes at home due to dialysis port on his chest. Vision Baseline Vision/History: 1 Wears glasses Patient Visual Report: No change from baseline Vision Assessment?: Yes Eye Alignment: Within Functional Limits Ocular Range of Motion: Within Functional Limits Alignment/Gaze Preference: Within Defined Limits Tracking/Visual Pursuits: Requires cues, head turns, or add eye shifts to track Saccades: Decreased speed of saccadic movement;Additional eye shifts occurred during testing Convergence: Within functional limits Visual Fields: No apparent deficits Perception  Perception: Within Functional Limits Praxis Praxis:  Intact Cognition Cognition Overall Cognitive Status: Within Functional Limits for tasks assessed [facility] for basic self care and home tasks) Arousal/Alertness: Awake/alert Orientation Level: Person;Place;Situation Brief Interview for Mental Status (BIMS) Repetition of Three Words (First Attempt): 3 Temporal Orientation: Year: Correct Temporal Orientation: Month: Accurate within 5 days Temporal Orientation: Day: Correct Recall: "Sock": Yes, no cue required Recall: "Blue": Yes, no cue required Recall: "Bed": Yes, no cue required BIMS Summary Score: 15 Sensation Sensation Light Touch: Appears Intact Hot/Cold: Appears Intact Proprioception: Appears Intact Stereognosis: Appears Intact Coordination Finger Nose Finger Test: WFL on R. slow on L due to hemiparesis but has accurate coordination 9 Hole Peg Test: unable to manipulate small pegs Motor  Motor Motor: Hemiplegia Motor - Discharge Observations: LUE weakness Mobility  Transfers Stand to Sit: Independent with assistive device  Trunk/Postural Assessment  Thoracic Assessment Thoracic Assessment: Exceptions to [facility] (rounded shoulders) Lumbar Assessment Lumbar Assessment: Exceptions to [facility] (posterior pelvic tilt) Postural Control Postural Control:  (delayed)  Balance Static Sitting Balance Static Sitting - Level of Assistance: 7: Independent Dynamic Sitting Balance Dynamic Sitting - Level of Assistance: 7: Independent Static Standing Balance Static Standing - Level of Assistance: 6: Modified independent (Device/Increase time) Dynamic Standing Balance Dynamic Standing - Level of Assistance: 6: Modified independent (Device/Increase time) Extremity/Trunk Assessment RUE Assessment RUE Assessment: Within Functional Limits LUE Assessment Passive Range of Motion (PROM) Comments: sh flexion to 110 Active Range of Motion (AROM) Comments: sh flex 40, elbow flexion 100, elbow ext -30, wrist ext 10, finger extension 75%, grasp  50% General Strength Comments: 2-/5 throughout UE   Christi Wirick 09/30/2021, 1:05 PM

## 2021-09-30 NOTE — Plan of Care (Signed)
  Problem: RH Problem Solving Goal: LTG Patient will demonstrate problem solving for (SLP) Description: LTG:  Patient will demonstrate problem solving for basic/complex daily situations with cues  (SLP) Outcome: Completed/Met   Problem: RH Memory Goal: LTG Patient will use memory compensatory aids to (SLP) Description: LTG:  Patient will use memory compensatory aids to recall biographical/new, daily complex information with cues (SLP) Outcome: Completed/Met   Problem: RH Awareness Goal: LTG: Patient will demonstrate awareness during functional activites type of (SLP) Description: LTG: Patient will demonstrate awareness during functional activites type of (SLP) Outcome: Completed/Met

## 2021-09-30 NOTE — Progress Notes (Signed)
PROGRESS NOTE   Subjective/Complaints:  LBM 8/27 still straining , has some back pain with BMs  ROS: Patient denies CP, SOB, N/V/D/C  Objective:   No results found.  Recent Labs    09/27/21 1425  WBC 4.3  HGB 7.9*  HCT 23.3*  PLT 179    Recent Labs    09/27/21 1425  NA 138  K 4.3  CL 100  CO2 28  GLUCOSE 99  BUN 37*  CREATININE 8.41*  CALCIUM 10.0     Intake/Output Summary (Last 24 hours) at 09/30/2021 0836 Last data filed at 09/29/2021 1752 Gross per 24 hour  Intake 480 ml  Output --  Net 480 ml         Physical Exam: Vital Signs Blood pressure (!) 158/87, pulse (!) 55, temperature 97.9 F (36.6 C), resp. rate 18, height 5' 5"  (1.651 m), weight 81.3 kg, SpO2 100 %.   General: No acute distress Mood and affect are appropriate Heart: Regular rate and rhythm no rubs murmurs or extra sounds Lungs: Clear to auscultation, breathing unlabored, no rales or wheezes, normal rate Abdomen: Positive bowel sounds, soft nontender to palpation, nondistended Extremities: No clubbing, cyanosis, or edema RIJ TDC  Skin: No evidence of breakdown, no evidence of rash, clean, covered with tegaderm Neurologic: Cranial nerves II through XII intact, motor strength is 5/5 in right  deltoid, bicep, tricep, grip, hip flexor, knee extensors, ankle dorsiflexor and plantar flexor 3- LUE, 4- LLE Sensory exam normal sensation to light touch and proprioception in bilateral upper and lower extremities  Musculoskeletal: Full range of motion in all 4 extremities. No joint swelling   Assessment/Plan: 1. Functional deficits which require 3+ hours per day of interdisciplinary therapy in a comprehensive inpatient rehab setting. Physiatrist is providing close team supervision and 24 hour management of active medical problems listed below. Physiatrist and rehab team continue to assess barriers to discharge/monitor patient progress  toward functional and medical goals  Care Tool:  Bathing    Body parts bathed by patient: Left arm, Chest, Abdomen, Front perineal area, Right upper leg, Left upper leg, Face   Body parts bathed by helper: Buttocks, Right arm, Right lower leg, Left lower leg     Bathing assist Assist Level: Moderate Assistance - Patient 50 - 74%     Upper Body Dressing/Undressing Upper body dressing   What is the patient wearing?: Pull over shirt    Upper body assist Assist Level: Contact Guard/Touching assist    Lower Body Dressing/Undressing Lower body dressing      What is the patient wearing?: Pants     Lower body assist Assist for lower body dressing: Minimal Assistance - Patient > 75%     Toileting Toileting Toileting Activity did not occur (Clothing management and hygiene only): N/A (no void or bm)  Toileting assist Assist for toileting: Moderate Assistance - Patient 50 - 74%     Transfers Chair/bed transfer  Transfers assist  Chair/bed transfer activity did not occur: Safety/medical concerns  Chair/bed transfer assist level: Minimal Assistance - Patient > 75%     Locomotion Ambulation   Ambulation assist      Assist level: Supervision/Verbal cueing  Assistive device: Cane-straight Max distance: 750 feet (total distance with 4 seated rest breaks)   Walk 10 feet activity   Assist     Assist level: Supervision/Verbal cueing Assistive device: Cane-straight   Walk 50 feet activity   Assist    Assist level: Supervision/Verbal cueing Assistive device: Cane-straight    Walk 150 feet activity   Assist    Assist level: Supervision/Verbal cueing Assistive device: Cane-straight    Walk 10 feet on uneven surface  activity   Assist     Assist level: Minimal Assistance - Patient > 75% Assistive device:  (handrail)   Wheelchair     Assist Is the patient using a wheelchair?: No             Wheelchair 50 feet with 2 turns  activity    Assist            Wheelchair 150 feet activity     Assist          Blood pressure (!) 158/87, pulse (!) 55, temperature 97.9 F (36.6 C), resp. rate 18, height 5' 5"  (1.651 m), weight 81.3 kg, SpO2 100 %.  Medical Problem List and Plan: 1. 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  Recommendations of 30-day monitor             -patient may shower             -ELOS/Goals: 10/01/2021 supervision goals  -Continue CIR therapies including PT, OT, and SLP  2.  Antithrombotics: -DVT/anticoagulation:  Mechanical: Antiembolism stockings, thigh (TED hose) Bilateral lower extremities             -antiplatelet therapy: Plavix 75 mg daily only.  DAPT held due to chronic anemia 3. Pain Management: N/A 4. Mood/Behavior/Sleep: Provide emotional support             -antipsychotic agents: N/A 5. Neuropsych/cognition: This patient is capable of making decisions on his own behalf. 6. Skin/Wound Care: Routine skin checks 7. Fluids/Electrolytes/Nutrition: Routine in and outs with follow-up chemistries 8.  Hypertension.  Norvasc 10 mg daily, clonidine patch 0.3 mg weekly, hydralazine 25 mg twice daily, Isordil 20 mg daily, Lopressor 100 mg twice daily.   Vitals:   09/29/21 2045 09/30/21 0532  BP:  (!) 158/87  Pulse: 62 (!) 55  Resp:  18  Temp:  97.9 F (36.6 C)  SpO2:  100%    BP stable overall, avoid hypotension  9.  End-stage renal disease.  Hemodialysis as directed per renal services             -HD at end of day to allow participation in therapies  -HD catheter replaced per [facility]   -HD MWF 10.  Hyperlipidemia.  Crestor 11.  Nonalcoholic cirrhosis with ascites.  Paracentesis 2014-2015. 12.  History of prostate cancer.  Followed outpatient by Dr. Gloriann Loan and recently completed radiation therapy.  Continue Flomax 13.  History of first-degree AV block.  No chest pain or shortness of breath 14.  Tobacco abuse.  Counseling 15.   COPD.  OSA.  Patient no longer on CPAP. Denies shortness of breath 16.  Chronic anemia.    IV iron weekly with ferric per hemodialysis, transfuse if < 7.0 , consider  if pt becomes orthostatic or tachy in therapy .     Latest Ref Rng & Units 09/27/2021    2:25 PM 09/25/2021    4:49 PM 09/24/2021    2:08 PM  CBC  WBC  4.0 - 10.5 K/uL 4.3  6.3    Hemoglobin 13.0 - 17.0 g/dL 7.9  8.0  9.2   Hematocrit 39.0 - 52.0 % 23.3  24.2  27.0   Platelets 150 - 400 K/uL 179  173      Hgb stable at 7.9 8/25  17.  Hx of GERD, took a tablet at home for this will start PPI -Continue protonix, TUMS PRN 18.  Mild thrombocytopenia, no bleeding monitor plt count no sq heparin for DVT prophyllaxis  19. Constipation, chronic  -Will add senokot daily  LOS: 14 days A FACE TO FACE EVALUATION WAS PERFORMED  Charlett Blake 09/30/2021, 8:36 AM

## 2021-10-01 ENCOUNTER — Other Ambulatory Visit (HOSPITAL_COMMUNITY): Payer: Self-pay

## 2021-10-01 ENCOUNTER — Inpatient Hospital Stay: Payer: Medicare Other | Admitting: Registered Nurse

## 2021-10-01 NOTE — Progress Notes (Signed)
PROGRESS NOTE   Subjective/Complaints:  Appreciate renal note, mod I in room last noc  ROS: Patient denies CP, SOB, N/V/D/C  Objective:   No results found.  No results for input(s): "WBC", "HGB", "HCT", "PLT" in the last 72 hours.  No results for input(s): "NA", "K", "CL", "CO2", "GLUCOSE", "BUN", "CREATININE", "CALCIUM" in the last 72 hours.   Intake/Output Summary (Last 24 hours) at 10/01/2021 0753 Last data filed at 09/30/2021 1825 Gross per 24 hour  Intake --  Output 0.5 ml  Net -0.5 ml         Physical Exam: Vital Signs Blood pressure (!) 171/91, pulse (!) 57, temperature 98.2 F (36.8 C), temperature source Oral, resp. rate 16, height 5' 5"  (1.651 m), weight 80 kg, SpO2 100 %.   General: No acute distress Mood and affect are appropriate Heart: Regular rate and rhythm no rubs murmurs or extra sounds Lungs: Clear to auscultation, breathing unlabored, no rales or wheezes, normal rate Abdomen: Positive bowel sounds, soft nontender to palpation, nondistended Extremities: No clubbing, cyanosis, or edema RIJ TDC  Skin: No evidence of breakdown, no evidence of rash, clean, covered with tegaderm Neurologic: Cranial nerves II through XII intact, motor strength is 5/5 in right  deltoid, bicep, tricep, grip, hip flexor, knee extensors, ankle dorsiflexor and plantar flexor 3- LUE, 4- LLE Sensory exam normal sensation to light touch and proprioception in bilateral upper and lower extremities  Musculoskeletal: Full range of motion in all 4 extremities. No joint swelling   Assessment/Plan: 1. Functional deficits due to RIght CVA Stable for D/C today F/u PCP in 3-4 weeks F/u PM&R 2 weeks See D/C summary See D/C instructions   Care Tool:  Bathing    Body parts bathed by patient: Left arm, Chest, Abdomen, Front perineal area, Right upper leg, Left upper leg, Face, Right arm, Buttocks, Left lower leg, Right lower  leg   Body parts bathed by helper: Buttocks, Right arm, Right lower leg, Left lower leg     Bathing assist Assist Level: Independent with assistive device     Upper Body Dressing/Undressing Upper body dressing   What is the patient wearing?: Pull over shirt    Upper body assist Assist Level: Independent    Lower Body Dressing/Undressing Lower body dressing      What is the patient wearing?: Pants     Lower body assist Assist for lower body dressing: Independent with assitive device     Toileting Toileting Toileting Activity did not occur (Clothing management and hygiene only): N/A (no void or bm)  Toileting assist Assist for toileting: Independent with assistive device     Transfers Chair/bed transfer  Transfers assist  Chair/bed transfer activity did not occur: Safety/medical concerns  Chair/bed transfer assist level: Independent with assistive device Chair/bed transfer assistive device: Research officer, political party   Ambulation assist      Assist level: Independent with assistive device Assistive device: Cane-straight Max distance: 2000 ft   Walk 10 feet activity   Assist     Assist level: Independent Assistive device: No Device   Walk 50 feet activity   Assist    Assist level: Independent with assistive device  Assistive device: Cane-straight    Walk 150 feet activity   Assist    Assist level: Independent with assistive device Assistive device: Cane-straight    Walk 10 feet on uneven surface  activity   Assist     Assist level: Independent with assistive device Assistive device: Cane-straight   Wheelchair     Assist Is the patient using a wheelchair?: No             Wheelchair 50 feet with 2 turns activity    Assist            Wheelchair 150 feet activity     Assist          Blood pressure (!) 171/91, pulse (!) 57, temperature 98.2 F (36.8 C), temperature source Oral, resp. rate 16, height  5' 5"  (1.651 m), weight 80 kg, SpO2 100 %.  Medical Problem List and Plan: 1. 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  Recommendations of 30-day monitor       ready for d/c 2.  Antithrombotics: -DVT/anticoagulation:  Mechanical: Antiembolism stockings, thigh (TED hose) Bilateral lower extremities             -antiplatelet therapy: Plavix 75 mg daily only.  DAPT held due to chronic anemia 3. Pain Management: N/A 4. Mood/Behavior/Sleep: Provide emotional support             -antipsychotic agents: N/A 5. Neuropsych/cognition: This patient is capable of making decisions on his own behalf. 6. Skin/Wound Care: Routine skin checks 7. Fluids/Electrolytes/Nutrition: Routine in and outs with follow-up chemistries 8.  Hypertension.  Norvasc 10 mg daily, clonidine patch 0.3 mg weekly, hydralazine 25 mg twice daily, Isordil 20 mg daily, Lopressor 100 mg twice daily.   Vitals:   09/30/21 2137 10/01/21 0545  BP: 112/77 (!) 171/91  Pulse: (!) 51 (!) 57  Resp:  16  Temp:  98.2 F (36.8 C)  SpO2:  100%    BP stable overall, avoid hypotension  9.  End-stage renal disease.  Hemodialysis as directed per renal services             -HD at end of day to allow participation in therapies  -HD catheter replaced per [facility]   -HD MWF 10.  Hyperlipidemia.  Crestor 11.  Nonalcoholic cirrhosis with ascites.  Paracentesis 2014-2015. 12.  History of prostate cancer.  Followed outpatient by Dr. Gloriann Loan and recently completed radiation therapy.  Continue Flomax 13.  History of first-degree AV block.  No chest pain or shortness of breath 14.  Tobacco abuse.  Counseling 15.  COPD.  OSA.  Patient no longer on CPAP. Denies shortness of breath 16.  Chronic anemia.    IV iron weekly with ferric per hemodialysis, transfuse if < 7.0 , consider  if pt becomes orthostatic or tachy in therapy .     Latest Ref Rng & Units 09/27/2021    2:25 PM 09/25/2021    4:49 PM 09/24/2021     2:08 PM  CBC  WBC 4.0 - 10.5 K/uL 4.3  6.3    Hemoglobin 13.0 - 17.0 g/dL 7.9  8.0  9.2   Hematocrit 39.0 - 52.0 % 23.3  24.2  27.0   Platelets 150 - 400 K/uL 179  173      Hgb stable at 7.9 8/25  17.  Hx of GERD, took a tablet at home for this will start PPI -Continue protonix, TUMS PRN 18.  Mild thrombocytopenia, no bleeding monitor  plt count no sq heparin for DVT prophyllaxis  19. Constipation, chronic  -Will add senokot daily  LOS: 15 days A FACE TO FACE EVALUATION WAS PERFORMED  Charlett Blake 10/01/2021, 7:53 AM

## 2021-10-01 NOTE — Progress Notes (Signed)
Contacted [facility] to confirm that pt will d/c from rehab today and will resume at clinic tomorrow.   Melven Sartorius Renal Navigator [phone]

## 2021-10-01 NOTE — Progress Notes (Signed)
Discharge  instructions provided by PA. Staff assisted patient off unit into private car w/friend. Patient discharged with no issues.    Yehuda Mao, LPN

## 2021-10-01 NOTE — Progress Notes (Signed)
[facility] Progress Note  OP HD orders: [place] MWF 4h  400/800  85kg  2/2 bath  P2  Heparin 4500 then 20102mdrun prn  RIJ TDC - sodium ferric gluconate 125 mg weekly IV, last 8/11 - doxercalciferol 4 ug IV tiw - last HD 8/11, post wt 85.1kg - last Hb 8.4 on 8/9, ferritin 318 on 7/19 - hep B labs done 8/16, non-immune  Assessment / Plan:  Subacute CVA -symptom onset 8/11, out of window for intervention. 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 Tolerated treatment 8/28 HD 500 ml UF. No absolute indication for RRT and the patient appears to be  comfortable. Stable for d/c.    HTN - resumed BP home meds x 4, bp's good - 5 kg under dry wt, euvolemic on exam  CKD MBD - renal diet - CCa high, holding vdra - phos in range, cont renvela  Anemia of CKD - Hb 8-9 here -not on ESAs due to prostate Ca - has venofer allergy but has been tolerating Fe as an outpatient chronically and here as well; sp 250 mg x 2 on 8/16- 8/18 here then dc'd -transfuse prn for hgb <7  Prostate Ca -just completed XRT as an outpatient  Subjective: seen in room, no c/o's today, denies sob, nausea.   Vitals:   09/30/21 1843 09/30/21 1935 09/30/21 2137 10/01/21 0545  BP:  (!) 160/94 112/77 (!) 171/91  Pulse:  62 (!) 51 (!) 57  Resp:  18  16  Temp:  98.2 F (36.8 C)  98.2 F (36.8 C)  TempSrc:    Oral  SpO2:  100%  100%  Weight: 79.1 kg   80 kg  Height:        Exam: Gen:NAD CVS:RRR Resp:normal WOB, CTA BL AFVC:BSWHExt: no sig edema b/l LEs Neuro: left sided weakness noted, awake/alert [facility]  Recent Labs  Lab 09/25/21 1649 09/27/21 1425  HGB 8.0* 7.9*  ALBUMIN 3.5 3.4*  CALCIUM 10.6* 10.0  PHOS 5.5* 3.9  CREATININE 11.04* 8.41*  K 4.7 4.3   No results for input(s): "IRON", "TIBC", "FERRITIN" in the last 168 hours. Inpatient medications:  amLODipine  10 mg Oral QHS    Chlorhexidine Gluconate Cloth  6 each Topical Q12H   Chlorhexidine Gluconate Cloth  6 each Topical Q0600   cloNIDine  0.3 mg Transdermal Q Sun   clopidogrel  75 mg Oral Daily   hydrALAZINE  25 mg Oral BID   isosorbide dinitrate  20 mg Oral BID   metoprolol tartrate  100 mg Oral BID   pantoprazole  20 mg Oral Daily   rosuvastatin  10 mg Oral Daily   senna  1 tablet Oral Daily   senna-docusate  2 tablet Oral BID   sevelamer carbonate  3,200 mg Oral TID WC   tamsulosin  0.4 mg Oral QPC supper    acetaminophen, calcium carbonate, sodium chloride flush, umeclidinium-vilanterol

## 2021-10-02 ENCOUNTER — Telehealth: Payer: Self-pay

## 2021-10-02 DIAGNOSIS — J158 Pneumonia due to other specified bacteria: Secondary | ICD-10-CM | POA: Diagnosis not present

## 2021-10-02 DIAGNOSIS — I639 Cerebral infarction, unspecified: Secondary | ICD-10-CM | POA: Diagnosis not present

## 2021-10-02 DIAGNOSIS — D631 Anemia in chronic kidney disease: Secondary | ICD-10-CM | POA: Diagnosis not present

## 2021-10-02 DIAGNOSIS — E1151 Type 2 diabetes mellitus with diabetic peripheral angiopathy without gangrene: Secondary | ICD-10-CM | POA: Diagnosis not present

## 2021-10-02 DIAGNOSIS — N2581 Secondary hyperparathyroidism of renal origin: Secondary | ICD-10-CM | POA: Diagnosis not present

## 2021-10-02 DIAGNOSIS — Z992 Dependence on renal dialysis: Secondary | ICD-10-CM | POA: Diagnosis not present

## 2021-10-02 DIAGNOSIS — N186 End stage renal disease: Secondary | ICD-10-CM | POA: Diagnosis not present

## 2021-10-02 NOTE — Patient Outreach (Signed)
  Care Coordination TOC Note Transition Care Management Follow-up Telephone Call Date of discharge and from where: [facility] 09/16/21-09/30/21 How have you been since you were released from the hospital? "I am happy to be home, its just I am by myself and a bit nervous about it", Any questions or concerns? No  Items Reviewed: Did the pt receive and understand the discharge instructions provided? Yes  Medications obtained and verified? Yes  Other? No  Any new allergies since your discharge? No  Dietary orders reviewed? Yes Do you have support at home? No   Home Care and Equipment/Supplies: Were home health services ordered? yes If so, what is the name of the agency? [facility]  Has the agency set up a time to come to the patient's home? no Were any new equipment or medical supplies ordered?  No What is the name of the medical supply agency? N/A Were you able to get the supplies/equipment? not applicable Do you have any questions related to the use of the equipment or supplies? No  Functional Questionnaire: (I = Independent and D = Dependent) ADLs: I  Bathing/Dressing- I  Meal Prep- I  Eating- I  Maintaining continence- I  Transferring/Ambulation- I  Managing Meds- I  Follow up appointments reviewed:  PCP Hospital f/u appt confirmed? Yes  Scheduled to see Durel Salts, transitional care, on 10/14/21 @ 10:20. [facility] f/u appt confirmed? No   Are transportation arrangements needed? No  If their condition worsens, is the pt aware to call PCP or go to the Emergency Dept.? Yes Was the patient provided with contact information for the PCP's office or ED? Yes Was to pt encouraged to call back with questions or concerns? Yes  SDOH assessments and interventions completed:   Yes  Care Coordination Interventions Activated:  Yes   Care Coordination Interventions:  Referred for Care Coordination Services:  RN Care Coordinator    Encounter Outcome:  Pt. Visit  Completed

## 2021-10-03 ENCOUNTER — Telehealth: Payer: Self-pay

## 2021-10-03 ENCOUNTER — Ambulatory Visit: Payer: Self-pay

## 2021-10-03 DIAGNOSIS — M1990 Unspecified osteoarthritis, unspecified site: Secondary | ICD-10-CM | POA: Diagnosis not present

## 2021-10-03 DIAGNOSIS — E1151 Type 2 diabetes mellitus with diabetic peripheral angiopathy without gangrene: Secondary | ICD-10-CM | POA: Diagnosis not present

## 2021-10-03 DIAGNOSIS — I69354 Hemiplegia and hemiparesis following cerebral infarction affecting left non-dominant side: Secondary | ICD-10-CM | POA: Diagnosis not present

## 2021-10-03 DIAGNOSIS — I5020 Unspecified systolic (congestive) heart failure: Secondary | ICD-10-CM | POA: Diagnosis not present

## 2021-10-03 DIAGNOSIS — I471 Supraventricular tachycardia: Secondary | ICD-10-CM | POA: Diagnosis not present

## 2021-10-03 DIAGNOSIS — F5109 Other insomnia not due to a substance or known physiological condition: Secondary | ICD-10-CM | POA: Diagnosis not present

## 2021-10-03 DIAGNOSIS — I428 Other cardiomyopathies: Secondary | ICD-10-CM | POA: Diagnosis not present

## 2021-10-03 DIAGNOSIS — I440 Atrioventricular block, first degree: Secondary | ICD-10-CM | POA: Diagnosis not present

## 2021-10-03 DIAGNOSIS — N186 End stage renal disease: Secondary | ICD-10-CM | POA: Diagnosis not present

## 2021-10-03 DIAGNOSIS — F32A Depression, unspecified: Secondary | ICD-10-CM | POA: Diagnosis not present

## 2021-10-03 DIAGNOSIS — K449 Diaphragmatic hernia without obstruction or gangrene: Secondary | ICD-10-CM | POA: Diagnosis not present

## 2021-10-03 DIAGNOSIS — I132 Hypertensive heart and chronic kidney disease with heart failure and with stage 5 chronic kidney disease, or end stage renal disease: Secondary | ICD-10-CM | POA: Diagnosis not present

## 2021-10-03 DIAGNOSIS — F411 Generalized anxiety disorder: Secondary | ICD-10-CM | POA: Diagnosis not present

## 2021-10-03 DIAGNOSIS — D630 Anemia in neoplastic disease: Secondary | ICD-10-CM | POA: Diagnosis not present

## 2021-10-03 DIAGNOSIS — I69318 Other symptoms and signs involving cognitive functions following cerebral infarction: Secondary | ICD-10-CM | POA: Diagnosis not present

## 2021-10-03 DIAGNOSIS — J449 Chronic obstructive pulmonary disease, unspecified: Secondary | ICD-10-CM | POA: Diagnosis not present

## 2021-10-03 DIAGNOSIS — N2581 Secondary hyperparathyroidism of renal origin: Secondary | ICD-10-CM | POA: Diagnosis not present

## 2021-10-03 DIAGNOSIS — K7460 Unspecified cirrhosis of liver: Secondary | ICD-10-CM | POA: Diagnosis not present

## 2021-10-03 DIAGNOSIS — D631 Anemia in chronic kidney disease: Secondary | ICD-10-CM | POA: Diagnosis not present

## 2021-10-03 DIAGNOSIS — C61 Malignant neoplasm of prostate: Secondary | ICD-10-CM | POA: Diagnosis not present

## 2021-10-03 DIAGNOSIS — G4733 Obstructive sleep apnea (adult) (pediatric): Secondary | ICD-10-CM | POA: Diagnosis not present

## 2021-10-03 DIAGNOSIS — E1122 Type 2 diabetes mellitus with diabetic chronic kidney disease: Secondary | ICD-10-CM | POA: Diagnosis not present

## 2021-10-03 DIAGNOSIS — N400 Enlarged prostate without lower urinary tract symptoms: Secondary | ICD-10-CM | POA: Diagnosis not present

## 2021-10-03 DIAGNOSIS — D509 Iron deficiency anemia, unspecified: Secondary | ICD-10-CM | POA: Diagnosis not present

## 2021-10-03 DIAGNOSIS — I69322 Dysarthria following cerebral infarction: Secondary | ICD-10-CM | POA: Diagnosis not present

## 2021-10-03 NOTE — Telephone Encounter (Addendum)
Transitional Care Call--who you spoke with James Wall - Patient requested a call back. He was in the middle of another phone call.    Are you/is patient experiencing any problems since coming home? No. Are there any questions regarding any aspect of care? No.  Are there any questions regarding medications administration / dosing?No.  Are meds being taken as prescribed?  Yes. Patient should review meds with caller to confirm. He does not know what medications he takes.  A friend put  his medications in a pill box.  Have there been any falls? No falls.  Has Home Health been to the house and/or have they contacted you? Yes they are due today at 4 PM. If not, have you tried to contact them? N/A. Can we help you contact them? N/A. Are bowels and bladder emptying properly? Yes. Are there any unexpected incontinence issues? Dialysis Patient. If applicable, is patient following bowel/bladder programs? Yes.  Any fevers, problems with breathing, unexpected pain? No. Are there any skin problems or new areas of breakdown? No.  Has the patient/family member arranged specialty MD follow up (ie cardiology/neurology/renal/surgical/etc)? Patient stated he is working on his healthcare himself. Can we help arrange?No.  Does the patient need any other services or support that we can help arrange?No. Are caregivers following through as expected in assisting the patient. Yes.         11. Has the patient quit smoking, drinking alcohol, or using drugs as recommended? Patient advised . Reply: Thank you for telling me but I am going to smoke.  Appointment appointment on 10/29/2021 to see Danella Sensing NP arrive at 1:15 PM. [place] [phone]

## 2021-10-03 NOTE — Progress Notes (Signed)
Inpatient Rehabilitation Care Coordinator Discharge Note   Patient Details  Name: James Wall MRN: [id number] Date of Birth: [birth year]   Discharge location: Home  Length of Stay:    Discharge activity level: Sup/Min  Home/community participation: friend and children  Patient response VT:XLEZVG Literacy - How often do you need to have someone help you when you read instructions, pamphlets, or other written material from your doctor or pharmacy?: Never  Patient response JF:TNBZXY Isolation - How often do you feel lonely or isolated from those around you?: Never  Services provided included: SW, Pharmacy, TR, CM, RN, SLP, OT, PT, RD, MD  Financial Services:  Financial Services Utilized: Medicare    Choices offered to/list presented to: patient  Follow-up services arranged:  [place]: [place]         Patient response to transportation need: Is the patient able to respond to transportation needs?: Yes In the past 12 months, has lack of transportation kept you from medical appointments or from getting medications?: No In the past 12 months, has lack of transportation kept you from meetings, work, or from getting things needed for daily living?: No    Comments (or additional information):  Patient/Family verbalized understanding of follow-up arrangements:     Individual responsible for coordination of the follow-up plan: self  Confirmed correct DME delivered: Dyanne Iha 10/03/2021    Dyanne Iha

## 2021-10-04 ENCOUNTER — Telehealth: Payer: Self-pay | Admitting: *Deleted

## 2021-10-04 DIAGNOSIS — J158 Pneumonia due to other specified bacteria: Secondary | ICD-10-CM | POA: Diagnosis not present

## 2021-10-04 DIAGNOSIS — D631 Anemia in chronic kidney disease: Secondary | ICD-10-CM | POA: Diagnosis not present

## 2021-10-04 DIAGNOSIS — D509 Iron deficiency anemia, unspecified: Secondary | ICD-10-CM | POA: Diagnosis not present

## 2021-10-04 DIAGNOSIS — N2581 Secondary hyperparathyroidism of renal origin: Secondary | ICD-10-CM | POA: Diagnosis not present

## 2021-10-04 DIAGNOSIS — N186 End stage renal disease: Secondary | ICD-10-CM | POA: Diagnosis not present

## 2021-10-04 DIAGNOSIS — Z992 Dependence on renal dialysis: Secondary | ICD-10-CM | POA: Diagnosis not present

## 2021-10-04 DIAGNOSIS — I12 Hypertensive chronic kidney disease with 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: Secondary | ICD-10-CM | POA: Diagnosis not present

## 2021-10-04 NOTE — Patient Outreach (Signed)
  Care Coordination   Follow Up Visit Note 10/03/21 Name: Mitsuo Budnick MRN: [id number] DOB: 04-12-1965  Jesiah Grismer Pretty is a 56 y.o. year old male who sees Wells Guiles, DO for primary care. I spoke with  [name] by phone today.  What matters to the patients health and wellness today?  I was discharged from the hospital.    Goals Addressed             This Visit's Progress    I was just out of the hospital       Care Coordination Interventions: Evaluation of current treatment plan related to patient's adherence to plan as established by provider I recently spoke with Mr. Slape to ensure he was doing well and had everything he needed once he left the hospital. He mentioned that he was receiving help from friends and neighbors at home. He had all his medications, and his friend was helping fill his pill box. He also said that he received a call from home health, and they were supposed to visit him today. Mr. Streater is aware of the protocol for stroke and is very much on top of his health situation. Despite this, he will still need to go to dialysis, but he feels that the unit is too cold and has already raised this issue with the office. Reviewed Importance of taking all medications as prescribed Assessed for signs and symptoms of stroke Assessed for management of bladder and/or bowel incontinence Active listening / Reflection utilized  Emotional Support Provided Problem [place] strategies reviewed         SDOH assessments and interventions completed:  No     Care Coordination Interventions Activated:  Yes  Care Coordination Interventions:  Yes, provided   Follow up plan: Follow up call scheduled for 10/15/21    Encounter Outcome:  Pt. Visit Completed   Lazaro Arms RN, BSN, [facility]   Phone: [phone]

## 2021-10-04 NOTE — Patient Instructions (Signed)
Visit Information  Thank you for taking time to visit with me today. Please don't hesitate to contact me if I can be of assistance to you.   Following are the goals we discussed today:   Goals Addressed             This Visit's Progress    I was just out of the hospital       Care Coordination Interventions: Evaluation of current treatment plan related to patient's adherence to plan as established by provider I recently spoke with James Wall to ensure he was doing well and had everything he needed once he left the hospital. He mentioned that he was receiving help from friends and neighbors at home. He had all his medications, and his friend was helping fill his pill box. He also said that he received a call from home health, and they were supposed to visit him today. James Wall is aware of the protocol for stroke and is very much on top of his health situation. Despite this, he will still need to go to dialysis, but he feels that the unit is too cold and has already raised this issue with the office. Reviewed Importance of taking all medications as prescribed Assessed for signs and symptoms of stroke Assessed for management of bladder and/or bowel incontinence Active listening / Reflection utilized  Emotional Support Provided Problem [place] strategies reviewed         Our next appointment is by telephone on 9/12 at 945 am  Please call the care guide team at [phone] if you need to cancel or reschedule your appointment.   If you are experiencing a Mental Health or [place] or need someone to talk to, please call 1-800-273-TALK (toll free, 24 hour hotline)  Patient verbalizes understanding of instructions and care plan provided today and agrees to view in [place]. Active MyChart status and patient understanding of how to access instructions and care plan via MyChart confirmed with patient.     Lazaro Arms RN, BSN, [facility]   Phone: [phone]

## 2021-10-04 NOTE — Telephone Encounter (Signed)
[facility] called for POC beginning 10/03/21 1wk1,2wk4,1wk2.  Approval given.

## 2021-10-07 DIAGNOSIS — J158 Pneumonia due to other specified bacteria: Secondary | ICD-10-CM | POA: Diagnosis not present

## 2021-10-07 DIAGNOSIS — D509 Iron deficiency anemia, unspecified: Secondary | ICD-10-CM | POA: Diagnosis not present

## 2021-10-07 DIAGNOSIS — Z992 Dependence on renal dialysis: Secondary | ICD-10-CM | POA: Diagnosis not present

## 2021-10-07 DIAGNOSIS — N2581 Secondary hyperparathyroidism of renal origin: Secondary | ICD-10-CM | POA: Diagnosis not present

## 2021-10-07 DIAGNOSIS — N186 End stage renal disease: Secondary | ICD-10-CM | POA: Diagnosis not present

## 2021-10-07 DIAGNOSIS — D631 Anemia in chronic kidney disease: Secondary | ICD-10-CM | POA: Diagnosis not present

## 2021-10-09 DIAGNOSIS — N186 End stage renal disease: Secondary | ICD-10-CM | POA: Diagnosis not present

## 2021-10-09 DIAGNOSIS — D509 Iron deficiency anemia, unspecified: Secondary | ICD-10-CM | POA: Diagnosis not present

## 2021-10-09 DIAGNOSIS — J158 Pneumonia due to other specified bacteria: Secondary | ICD-10-CM | POA: Diagnosis not present

## 2021-10-09 DIAGNOSIS — D631 Anemia in chronic kidney disease: Secondary | ICD-10-CM | POA: Diagnosis not present

## 2021-10-09 DIAGNOSIS — Z992 Dependence on renal dialysis: Secondary | ICD-10-CM | POA: Diagnosis not present

## 2021-10-09 DIAGNOSIS — N2581 Secondary hyperparathyroidism of renal origin: Secondary | ICD-10-CM | POA: Diagnosis not present

## 2021-10-11 DIAGNOSIS — N186 End stage renal disease: Secondary | ICD-10-CM | POA: Diagnosis not present

## 2021-10-11 DIAGNOSIS — N2581 Secondary hyperparathyroidism of renal origin: Secondary | ICD-10-CM | POA: Diagnosis not present

## 2021-10-11 DIAGNOSIS — J158 Pneumonia due to other specified bacteria: Secondary | ICD-10-CM | POA: Diagnosis not present

## 2021-10-11 DIAGNOSIS — D631 Anemia in chronic kidney disease: Secondary | ICD-10-CM | POA: Diagnosis not present

## 2021-10-11 DIAGNOSIS — Z992 Dependence on renal dialysis: Secondary | ICD-10-CM | POA: Diagnosis not present

## 2021-10-11 DIAGNOSIS — D509 Iron deficiency anemia, unspecified: Secondary | ICD-10-CM | POA: Diagnosis not present

## 2021-10-14 ENCOUNTER — Encounter: Payer: Medicare Other | Admitting: Physical Medicine and Rehabilitation

## 2021-10-14 DIAGNOSIS — J158 Pneumonia due to other specified bacteria: Secondary | ICD-10-CM | POA: Diagnosis not present

## 2021-10-14 DIAGNOSIS — D509 Iron deficiency anemia, unspecified: Secondary | ICD-10-CM | POA: Diagnosis not present

## 2021-10-14 DIAGNOSIS — N186 End stage renal disease: Secondary | ICD-10-CM | POA: Diagnosis not present

## 2021-10-14 DIAGNOSIS — N2581 Secondary hyperparathyroidism of renal origin: Secondary | ICD-10-CM | POA: Diagnosis not present

## 2021-10-14 DIAGNOSIS — Z992 Dependence on renal dialysis: Secondary | ICD-10-CM | POA: Diagnosis not present

## 2021-10-14 DIAGNOSIS — D631 Anemia in chronic kidney disease: Secondary | ICD-10-CM | POA: Diagnosis not present

## 2021-10-15 DIAGNOSIS — I69354 Hemiplegia and hemiparesis following cerebral infarction affecting left non-dominant side: Secondary | ICD-10-CM | POA: Diagnosis not present

## 2021-10-15 DIAGNOSIS — E1151 Type 2 diabetes mellitus with diabetic peripheral angiopathy without gangrene: Secondary | ICD-10-CM | POA: Diagnosis not present

## 2021-10-15 DIAGNOSIS — I5020 Unspecified systolic (congestive) heart failure: Secondary | ICD-10-CM | POA: Diagnosis not present

## 2021-10-15 DIAGNOSIS — I69318 Other symptoms and signs involving cognitive functions following cerebral infarction: Secondary | ICD-10-CM | POA: Diagnosis not present

## 2021-10-15 DIAGNOSIS — I69322 Dysarthria following cerebral infarction: Secondary | ICD-10-CM | POA: Diagnosis not present

## 2021-10-15 DIAGNOSIS — I132 Hypertensive heart and chronic kidney disease with heart failure and with stage 5 chronic kidney disease, or end stage renal disease: Secondary | ICD-10-CM | POA: Diagnosis not present

## 2021-10-16 ENCOUNTER — Encounter: Payer: Self-pay | Admitting: Urology

## 2021-10-16 ENCOUNTER — Ambulatory Visit: Payer: Self-pay

## 2021-10-16 DIAGNOSIS — Z992 Dependence on renal dialysis: Secondary | ICD-10-CM | POA: Diagnosis not present

## 2021-10-16 DIAGNOSIS — J158 Pneumonia due to other specified bacteria: Secondary | ICD-10-CM | POA: Diagnosis not present

## 2021-10-16 DIAGNOSIS — D509 Iron deficiency anemia, unspecified: Secondary | ICD-10-CM | POA: Diagnosis not present

## 2021-10-16 DIAGNOSIS — N186 End stage renal disease: Secondary | ICD-10-CM | POA: Diagnosis not present

## 2021-10-16 DIAGNOSIS — D631 Anemia in chronic kidney disease: Secondary | ICD-10-CM | POA: Diagnosis not present

## 2021-10-16 DIAGNOSIS — N2581 Secondary hyperparathyroidism of renal origin: Secondary | ICD-10-CM | POA: Diagnosis not present

## 2021-10-16 NOTE — Progress Notes (Signed)
  Radiation Oncology         [phone] ________________________________  Name: James Wall MRN: [id number]  Date: 09/04/2021  DOB: [birth year]  End of Treatment Note  Diagnosis:    56 y.o. 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     Indication for treatment:  Curative, Definitive Radiotherapy concurrent with LT-ADT      Radiation treatment dates:   07/03/21 - 09/04/21  Site/dose:  1. The prostate, seminal vesicles, and pelvic lymph nodes were initially treated to 45 Gy in 25 fractions of 1.8 Gy  2. The prostate only was boosted to 75 Gy with 15 additional fractions of 2.0 Gy   Beams/energy:  1. The prostate, seminal vesicles, and pelvic lymph nodes were initially treated using VMAT intensity modulated radiotherapy delivering 6 megavolt photons. Image guidance was performed with CB-CT studies prior to each fraction. He was immobilized with a body fix lower extremity mold.  2. the prostate only was boosted using VMAT intensity modulated radiotherapy delivering 6 megavolt photons. Image guidance was performed with CB-CT studies prior to each fraction. He was immobilized with a body fix lower extremity mold.  Narrative: The patient tolerated radiation treatment relatively well with only minor urinary irritation and modest fatigue.  He did experience increased urgency, bladder spasms and weaker flow of stream which were managed with Flomax daily.  He denied any abdominal pain, nausea, vomiting, diarrhea or constipation.  Plan: The patient has completed radiation treatment. He will return to radiation oncology clinic for routine followup in one month. I advised him to call or return sooner if he has any questions or concerns related to his recovery or treatment. ________________________________  Sheral Apley. Tammi Klippel, M.D.

## 2021-10-16 NOTE — Progress Notes (Signed)
Radiation Oncology         [phone] ________________________________  Name: James Wall MRN: [id number]  Date: 10/17/2021  DOB: 1965/09/29  Post Treatment Note  CC: Wells Guiles, DO  Lucas Mallow, MD  Diagnosis:    56 y.o. 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   Interval Since Last Radiation:  6 weeks  07/03/21 - 09/04/21: Concurrent with LT-ADT (started 04/04/2021)  1. The prostate, seminal vesicles, and pelvic lymph nodes were initially treated to 45 Gy in 25 fractions of 1.8 Gy  2. The prostate only was boosted to 75 Gy with 15 additional fractions of 2.0 Gy   Narrative:  I spoke with the patient to conduct his routine scheduled 1 month follow up visit via telephone to spare the patient unnecessary potential exposure in the healthcare setting during the current COVID-19 pandemic.  The patient was notified in advance and gave permission to proceed with this visit format.  He tolerated radiation treatment relatively well with only minor urinary irritation and modest fatigue.  He did experience increased urgency, bladder spasms and weaker flow of stream which were managed with Flomax daily.  He denied any abdominal pain, nausea, vomiting, diarrhea or constipation.                               On review of systems, the patient states that he continues to struggle with frequent bladder spasms, urgency with a weak stream and small volume voids although, he typically only voids once in the morning anyway due to being on hemodialysis. He has not seen any gross hematuria and has continued taking AZO prn spasms. He denies any fever, chills or night sweats. 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  PVRs checked during his hospitalization remained 0, despite the urgency and bladder spasms. He has continued with a healthy appetite and is maintaining his  weight. He does continue with hot flashes secondary to the ADT but otherwise, feels like he is tolerating treatment and LUTS are gradually improving, not worsening. He got a 6 month Eligard injection on 05/07/21.  ALLERGIES:  is allergic to venofer  [ferric oxide] and aspirin.  Meds: Current Outpatient Medications  Medication Sig Dispense Refill   acetaminophen (TYLENOL) 325 MG tablet Take 2 tablets (650 mg total) by mouth every 4 (four) hours as needed for moderate pain or mild pain.     amLODipine (NORVASC) 10 MG tablet Take 1 tablet (10 mg total) by mouth at bedtime. 30 tablet 0   cloNIDine (CATAPRES - DOSED IN MG/24 HR) 0.3 mg/24hr patch Place 1 patch (0.3 mg total) onto the skin once a week on Sunday. 4 patch 12   clopidogrel (PLAVIX) 75 MG tablet Take 1 tablet (75 mg total) by mouth daily. 30 tablet 0   hydrALAZINE (APRESOLINE) 25 MG tablet Take 1 tablet (25 mg total) by mouth 2 (two) times daily. 60 tablet 0   isosorbide dinitrate (ISORDIL) 20 MG tablet Take 1 tablet (20 mg total) by mouth 2 (two) times daily. 60 tablet 0   metoprolol tartrate (LOPRESSOR) 100 MG tablet Take 1 tablet (100 mg total) by mouth 2 (two) times daily. 60 tablet 0   pantoprazole (PROTONIX) 20 MG tablet Take 1 tablet (20 mg total) by mouth daily. 30 tablet 0   rosuvastatin (CRESTOR) 10 MG  tablet Take 1 tablet (10 mg total) by mouth daily. 30 tablet 0   senna (SENOKOT) 8.6 MG TABS tablet Take 1 tablet (8.6 mg total) by mouth daily. 120 tablet 0   sevelamer carbonate (RENVELA) 800 MG tablet Take 4 tablets (3,200 mg total) by mouth 3 (three) times daily with meals. 240 tablet 0   tamsulosin (FLOMAX) 0.4 MG CAPS capsule Take 1 capsule (0.4 mg total) by mouth daily after supper. 30 capsule 0   umeclidinium-vilanterol (ANORO ELLIPTA) 62.5-25 MCG/ACT AEPB Inhale 1 puff into the lungs daily. 60 each 1   No current facility-administered medications for this encounter.    Physical Findings:  vitals were not taken for this  visit.  Pain Assessment Pain Score: 0-No pain/10 Unable to assess due to telephone follow-up visit format.  Lab Findings: Lab Results  Component Value Date   WBC 4.3 09/27/2021   HGB 7.9 (L) 09/27/2021   HCT 23.3 (L) 09/27/2021   MCV 101.7 (H) 09/27/2021   PLT 179 09/27/2021     Radiographic Findings: DG CHEST PORT 1 VIEW  Result Date: 09/24/2021 CLINICAL DATA:  Technologist notes state status post PICC placement. Radiologic records indicates exchange of dialysis catheter. EXAM: PORTABLE CHEST 1 VIEW COMPARISON:  Chest radiograph earlier today. FINDINGS: Right-sided dialysis catheter with tip overlying the mid SVC. No additional central line. No pneumothorax. Stable lung volumes. Mild bibasilar atelectasis. Unchanged cardiomegaly. Unchanged mediastinal contours. Aortic atherosclerosis and tortuosity. IMPRESSION: 1. Right-sided dialysis catheter with tip overlying the mid SVC. No pneumothorax. 2. Unchanged cardiomegaly.  Bibasilar atelectasis. Electronically Signed   By: Keith Rake M.D.   On: 09/24/2021 16:19   DG C-Arm 1-60 Min-No Report  Result Date: 09/24/2021 Fluoroscopy was utilized by the requesting physician.  No radiographic interpretation.   DG CHEST PORT 1 VIEW  Result Date: 09/23/2021 CLINICAL DATA:  End-stage renal disease EXAM: PORTABLE CHEST 1 VIEW COMPARISON:  08/08/2020 FINDINGS: Right IJ approach hemodialysis catheter terminates at the superior cavoatrial junction. Stable cardiomegaly. Aortic atherosclerosis. Mildly prominent bibasilar interstitial markings. No focal consolidation. No pleural effusion or pneumothorax. The visualized skeletal structures are unremarkable. IMPRESSION: Mildly prominent bibasilar interstitial markings, could reflect mild edema. Electronically Signed   By: Davina Poke D.O.   On: 09/23/2021 10:26    Impression/Plan: 1.  56 y.o. 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  He will continue to follow up with urology for ongoing PSA determinations but does not currently have any follow up appointments scheduled with Dr. Gloriann Loan to his knowledge. He would like to get back in with Dr. Gloriann Loan sooner than later to discuss management of his LUTS and SEs associated with the ADT. His most recent ADT was a 6 month Eligard injection on 05/07/21. He is currently on Flomax and AZO.  He understands what to expect with regards to PSA monitoring going forward as reviewed today. I will look forward to following his response to treatment via correspondence with urology, and would be happy to continue to participate in his care if clinically indicated. I talked to the patient about what to expect in the future, including his risk for erectile dysfunction and rectal bleeding. I encouraged him to call or return to the office if he has any questions regarding his previous radiation or possible radiation side effects. He was comfortable with this plan and will follow up as needed.     Nicholos Johns, PA-C

## 2021-10-16 NOTE — Progress Notes (Signed)
Telephone appointment. I verified patient's identity and began nursing interview. 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 Patient states he is recovering well and doing okay overall. No other issues reported at this time.  Meaningful use complete. I-PSS score of 7-mild. Flomax as directed. Urology appt- None currently  Reminded patient of his 9:30am-10/17/21 telephone appointment w/ Ashlyn Bruning PA-C. I left my extension [phone] in case patient needs anything. Patient verbalized understanding.  Patient contact [phone]

## 2021-10-17 ENCOUNTER — Ambulatory Visit
Admission: RE | Admit: 2021-10-17 | Discharge: 2021-10-17 | Disposition: A | Discharge status: Home / Self Care | Payer: Medicare Other | Source: Ambulatory Visit | Attending: Urology | Admitting: Urology

## 2021-10-17 ENCOUNTER — Telehealth: Payer: Self-pay | Admitting: *Deleted

## 2021-10-17 DIAGNOSIS — E1151 Type 2 diabetes mellitus with diabetic peripheral angiopathy without gangrene: Secondary | ICD-10-CM | POA: Diagnosis not present

## 2021-10-17 DIAGNOSIS — I132 Hypertensive heart and chronic kidney disease with heart failure and with stage 5 chronic kidney disease, or end stage renal disease: Secondary | ICD-10-CM | POA: Diagnosis not present

## 2021-10-17 DIAGNOSIS — I69318 Other symptoms and signs involving cognitive functions following cerebral infarction: Secondary | ICD-10-CM | POA: Diagnosis not present

## 2021-10-17 DIAGNOSIS — I69354 Hemiplegia and hemiparesis following cerebral infarction affecting left non-dominant side: Secondary | ICD-10-CM | POA: Diagnosis not present

## 2021-10-17 DIAGNOSIS — I5020 Unspecified systolic (congestive) heart failure: Secondary | ICD-10-CM | POA: Diagnosis not present

## 2021-10-17 DIAGNOSIS — I69322 Dysarthria following cerebral infarction: Secondary | ICD-10-CM | POA: Diagnosis not present

## 2021-10-17 DIAGNOSIS — C61 Malignant neoplasm of prostate: Secondary | ICD-10-CM

## 2021-10-17 NOTE — Patient Instructions (Signed)
Visit Information  Thank you for taking time to visit with me today. Please don't hesitate to contact me if I can be of assistance to you.   Following are the goals we discussed today:   Goals Addressed             This Visit's Progress    I was just out of the hospital       Care Coordination Interventions: Evaluation of current treatment plan related to patient's adherence to plan as established by provider I had a conversation with Mr. Jerelene Redden after his dialysis session. He informed me that he was feeling better, but quite exhausted and feeling cold. He mentioned that he had an appointment scheduled with the cancer center the following day and was hoping to receive positive news. He also informed me that his Home Health nurse and Physical/Occupational Therapist were still visiting him at home, which he appreciated as it was helping him regain his strength. Reviewed Importance of taking all medications as prescribed Assessed for signs and symptoms of stroke Assessed for management of bladder and/or bowel incontinence Active listening / Reflection utilized  Emotional Support Provided Problem [place] strategies reviewed         Our next appointment is by telephone on 10/12 at 9 am  Please call the care guide team at [phone] if you need to cancel or reschedule your appointment.   If you are experiencing a Mental Health or [place] or need someone to talk to, please call 1-800-273-TALK (toll free, 24 hour hotline)  Patient verbalizes understanding of instructions and care plan provided today and agrees to view in [place]. Active MyChart status and patient understanding of how to access instructions and care plan via MyChart confirmed with patient.     Lazaro Arms RN, BSN, [facility]   Phone: [phone]

## 2021-10-17 NOTE — Telephone Encounter (Signed)
Mallory called from [facility] to get PT POC 2wk1 and 1wk4. She also reports his PB was 170/100. He had not taken his medication for the day when the BP was taken. He was instructed to check his bp after he takes his medication to make sure that it is going down.  POC approved and instructed to call his PCP with BP issues and if no better he should go to PCP/Urgent Care/ or ED to be evaluated if BP does not come down. I have spoke with him and given him that information as well.

## 2021-10-17 NOTE — Patient Outreach (Signed)
  Care Coordination   Follow Up Visit Note   10/16/2021 Name: James Wall MRN: [id number] DOB: [birth year]  James Wall is a 56 y.o. year old male who sees Wells Guiles, DO for primary care. I spoke with  [name] by phone today.  What matters to the patients health and wellness today?  I want to regain my strength    Goals Addressed             This Visit's Progress    I was just out of the hospital       Care Coordination Interventions: Evaluation of current treatment plan related to patient's adherence to plan as established by provider I had a conversation with Mr. Jerelene Redden after his dialysis session. He informed me that he was feeling better, but quite exhausted and feeling cold. He mentioned that he had an appointment scheduled with the cancer center the following day and was hoping to receive positive news. He also informed me that his Home Health nurse and Physical/Occupational Therapist were still visiting him at home, which he appreciated as it was helping him regain his strength. Reviewed Importance of taking all medications as prescribed Assessed for signs and symptoms of stroke Assessed for management of bladder and/or bowel incontinence Active listening / Reflection utilized  Emotional Support Provided Problem [place] strategies reviewed         SDOH assessments and interventions completed:  No     Care Coordination Interventions Activated:  Yes  Care Coordination Interventions:  Yes, provided   Follow up plan: Follow up call scheduled for 10/12 9 am    Encounter Outcome:  Pt. Visit Completed   Lazaro Arms RN, BSN, [facility]   Phone: [phone]

## 2021-10-18 DIAGNOSIS — N2581 Secondary hyperparathyroidism of renal origin: Secondary | ICD-10-CM | POA: Diagnosis not present

## 2021-10-18 DIAGNOSIS — Z992 Dependence on renal dialysis: Secondary | ICD-10-CM | POA: Diagnosis not present

## 2021-10-18 DIAGNOSIS — D631 Anemia in chronic kidney disease: Secondary | ICD-10-CM | POA: Diagnosis not present

## 2021-10-18 DIAGNOSIS — D509 Iron deficiency anemia, unspecified: Secondary | ICD-10-CM | POA: Diagnosis not present

## 2021-10-18 DIAGNOSIS — N186 End stage renal disease: Secondary | ICD-10-CM | POA: Diagnosis not present

## 2021-10-18 DIAGNOSIS — C61 Malignant neoplasm of prostate: Secondary | ICD-10-CM | POA: Diagnosis not present

## 2021-10-18 DIAGNOSIS — J158 Pneumonia due to other specified bacteria: Secondary | ICD-10-CM | POA: Diagnosis not present

## 2021-10-21 DIAGNOSIS — D509 Iron deficiency anemia, unspecified: Secondary | ICD-10-CM | POA: Diagnosis not present

## 2021-10-21 DIAGNOSIS — D631 Anemia in chronic kidney disease: Secondary | ICD-10-CM | POA: Diagnosis not present

## 2021-10-21 DIAGNOSIS — J158 Pneumonia due to other specified bacteria: Secondary | ICD-10-CM | POA: Diagnosis not present

## 2021-10-21 DIAGNOSIS — Z992 Dependence on renal dialysis: Secondary | ICD-10-CM | POA: Diagnosis not present

## 2021-10-21 DIAGNOSIS — N2581 Secondary hyperparathyroidism of renal origin: Secondary | ICD-10-CM | POA: Diagnosis not present

## 2021-10-21 DIAGNOSIS — N186 End stage renal disease: Secondary | ICD-10-CM | POA: Diagnosis not present

## 2021-10-21 NOTE — Progress Notes (Unsigned)
Cardiology Office Note:    Date:  10/22/2021   ID:  James Wall, DOB May 20, 1965, MRN [id number]  PCP:  Wells Guiles, DO   [facility] Providers Cardiologist:  Werner Lean, MD     Referring MD: Concepcion Living, MD   VV:ZSMOLM up HF  History of Present Illness:    James Wall is a 56 y.o. male with a hx of COPD and active tobacco abuse, ESRD via HD cath, HTN, HLD, PH NOS, history of Cirrhosis, HFmrEF who presents for evaluation 08/28/20.   2022: In internal has had improvement in his LVEF.  Still has had elevated in his blood pressure.  Lost  to follow up, 2023: in non cardiac encounters BP still elevated.  Patient notes that he is doing about the same.   Since last visit notes had a stroke in interval . Has been diagnosed with prostate cancer and is finished radiation.  No chest pain or pressure . No SOB/DOE and no PND/Orthopnea.   No weight gain or leg swelling.   No palpitations or syncope. Still smoking. Has some residual left sided weakness.  Ambulatory blood pressure is better per patient, no longer in 200s.    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 [facility])    primary cancer is prostate   Anemia associated with chronic renal failure    Anxiety    Arthritis    Cirrhosis, nonalcoholic ([place])    per pt possible from a medication , unsure ;   last ultrasound 08-09-2020 in epic no fibrosis   COPD (chronic obstructive pulmonary disease) ([place])    Depression    ESRD on hemodialysis ([place]) 2009   Started HD Jan 2009;  ESRD secondary to hypertensive nephrosclerosis;  dialysis at [facility] on MWF   First degree heart block    GERD (gastroesophageal reflux disease)    Hiatal hernia    History of acute respiratory failure 07/2013   admission;  HCAP w/ ARF with hypoxia   History of adenomatous polyp of colon    History of ascites    s/p paracentesis  01-31-2013 (5L) and last one 03-28-2013 (2.7L)   History of community acquired pneumonia 08/08/2020   admission ; LLL , POA   History of heart murmur in childhood    History of MRSA infection 12/19/2012   hospital admission;  w/ sepsis MRSA bacteramia AVF infection   History of sepsis 03/2017   admission;   HCAP w/ sepsis   Hyperlipidemia    Hypertension    Hypertensive heart disease    cardiologist--- dr Osborne Oman;  nuclear stress test 06-16-2013 intermediate risk w/ mid-distal anterior wall ischemia , not gated;  cardiac cath 07-13-2013 in epic showed normal coronaries and LVSF,  ef not assessed, LCEDP 60mHg   Hypertensive nephrosclerosis, 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 ([place]    Intolerance to cold    due to anemia   Malignant neoplasm prostate ([facility]    urologist--- dr bell/  radiation onologist--- dr mTammi Klippel  dx 01/ 2023,  Gleason 4+3, PSA 33  NICM (nonischemic cardiomyopathy) ([place]    followed by cardiology;   last echo in epic 09-13-2020 ef 55-60%   OSA (obstructive sleep apnea) 2009   no  longer using cpap since the yr started 2009;   sleep study in epic 05-11-2007 severe osa   PSVT (paroxysmal supraventricular tachycardia) ([place]  event monitor 08-01-2019  SR w/ SVT runs , rare PAC/ PVC   Secondary hyperparathyroidism ([place])    Wears glasses     Past Surgical History:  Procedure Laterality Date   AV FISTULA PLACEMENT Right 02/22/2013   Procedure:  CREATION  OF BRACHIAL CEPHALIC FISTULA RIGHT ARM;  Surgeon: Elam Dutch, MD;  Location: [place];  Service: Vascular;  Laterality: Right;   AV FISTULA PLACEMENT Left 08/10/2014   Procedure: BASILIC VEIN TRANSPOSITION  ARTERIOVENOUS (AV) FISTULA CREATION LEFT UPPER ARM;  Surgeon: Mal Misty, MD;  Location: [place];  Service: Vascular;  Laterality: Left;   AV FISTULA PLACEMENT, RADIOCEPHALIC Left 69/45/0388   @[facility] ;  Left lower arm   COLONOSCOPY  11/30/2018   by dr Tarri Glenn    ESOPHAGOGASTRODUODENOSCOPY (EGD) WITH PROPOFOL N/A 04/12/2013   Procedure: ESOPHAGOGASTRODUODENOSCOPY (EGD) WITH PROPOFOL;  Surgeon: Arta Silence, MD;  [facility]: [facility];  Service: Endoscopy;  Laterality: N/A;   EXCHANGE OF A DIALYSIS CATHETER Right 09/24/2021   Procedure: EXCHANGE OF A DIALYSIS CATHETER;  Surgeon: Angelia Mould, MD;  Location: [place];  Service: Vascular;  Laterality: Right;   GOLD SEED IMPLANT N/A 06/11/2021   Procedure: GOLD SEED IMPLANT;  Surgeon: Janith Lima, MD;  [facility];  Service: Urology;  Laterality: N/A;   INSERTION OF DIALYSIS CATHETER N/A 12/23/2012   Procedure: INSERTION OF DIALYSIS CATHETER; ULTRASOUND GUIDED;  Surgeon: Angelia Mould, MD;  Location: [place];  Service: Vascular;  Laterality: N/A;   INSERTION OF DIALYSIS CATHETER  10/22/2015   Right IJ non-tunneled HD catheter, placed again in 2019   IR FLUORO GUIDE CV LINE RIGHT  08/18/2019   IR FLUORO GUIDE CV LINE RIGHT  10/07/2019   IR GENERIC HISTORICAL  10/22/2015   IR US GUIDE VASC ACCESS RIGHT 10/22/2015 [facility]   IR GENERIC HISTORICAL  10/22/2015   IR FLUORO GUIDE CV LINE RIGHT 10/22/2015 [facility]   IR GENERIC HISTORICAL  10/23/2015   IR FLUORO GUIDE CV LINE RIGHT 10/23/2015 Marybelle Killings, MD [facility]   LEFT HEART CATHETERIZATION WITH CORONARY ANGIOGRAM N/A 07/13/2013   Procedure: LEFT HEART CATHETERIZATION WITH CORONARY ANGIOGRAM;  Surgeon: Jettie Booze, MD;  Location: [facility] CATH LAB;  Service: Cardiovascular;  Laterality: N/A;   LIGATION OF ARTERIOVENOUS  FISTULA Left 12/22/2012   Procedure: LIGATION OF ARTERIOVENOUS  FISTULA;EXCISION OF LARGE ANEURYSMS;;  Surgeon: Elam Dutch, MD;  Location: [place];  Service: Vascular;  Laterality: Left;   SPACE OAR INSTILLATION N/A 06/11/2021   Procedure: SPACE OAR INSTILLATION;  Surgeon: Janith Lima, MD;  [facility];  Service: Urology;  Laterality: N/A;   UPPER GASTROINTESTINAL ENDOSCOPY  09/07/2018   by dr  Tarri Glenn    Current Medications: Current Meds  Medication Sig   acetaminophen (TYLENOL) 325 MG tablet Take 2 tablets (650 mg total) by mouth every 4 (four) hours as needed for moderate pain or mild pain.   amLODipine (NORVASC) 10 MG tablet Take 1 tablet (10 mg total) by mouth at bedtime.   cloNIDine (CATAPRES - DOSED IN MG/24 HR) 0.3 mg/24hr patch Place 1 patch (0.3 mg total) onto the skin once a week on Sunday.   clopidogrel (PLAVIX) 75 MG tablet Take 1 tablet (75 mg total) by mouth daily.   hydrALAZINE (APRESOLINE) 50 MG tablet Take 1 tablet (50 mg total) by mouth 2 (two) times daily.   isosorbide dinitrate (ISORDIL) 20 MG tablet Take 1 tablet (20 mg total) by mouth 2 (two) times  daily.   metoprolol tartrate (LOPRESSOR) 100 MG tablet Take 1 tablet (100 mg total) by mouth 2 (two) times daily.   pantoprazole (PROTONIX) 20 MG tablet Take 1 tablet (20 mg total) by mouth daily.   rosuvastatin (CRESTOR) 10 MG tablet Take 1 tablet (10 mg total) by mouth daily.   sevelamer carbonate (RENVELA) 800 MG tablet Take 4 tablets (3,200 mg total) by mouth 3 (three) times daily with meals.   tamsulosin (FLOMAX) 0.4 MG CAPS capsule Take 1 capsule (0.4 mg total) by mouth daily after supper.   [DISCONTINUED] hydrALAZINE (APRESOLINE) 25 MG tablet Take 1 tablet (25 mg total) by mouth 2 (two) times daily.   [DISCONTINUED] senna (SENOKOT) 8.6 MG TABS tablet Take 1 tablet (8.6 mg total) by mouth daily.   [DISCONTINUED] umeclidinium-vilanterol (ANORO ELLIPTA) 62.5-25 MCG/ACT AEPB Inhale 1 puff into the lungs daily.     Allergies:   Venofer  [ferric oxide] and Aspirin   Social History   Socioeconomic History   Marital status: Single    Spouse name: Not on file   Number of children: 0   Years of education: 12th   Highest education level: High school graduate  Occupational History   Occupation: n/a  Tobacco Use   Smoking status: Every Day    Packs/day: 0.50    Years: 17.00    Total pack years: 8.50     Types: Cigarettes   Smokeless tobacco: Never   Tobacco comments:    06-06-2021  per pt had quit smoking 06/ 2021 for 16 yrs , but restarted approx 10/ 2022 1pp 3-4 days  Vaping Use   Vaping Use: Never used  Substance and Sexual Activity   Alcohol use: Yes    Comment: drinks liquor occ   Drug use: Yes    Frequency: 4.0 times per week    Types: Marijuana    Comment: 06-06-2021 per pt smokes 2-4 weekly, (half of joint "blunt")   Sexual activity: Not Currently  Other Topics Concern   Not on file  Social History Narrative   Patient lives alone in [place].    Patient has never been married and does not have any children.    Patients support system, his mother, pass away 2016.   Patient does not own his own vehicle, uses public transportation with no concerns at this time.   Social Determinants of Health   Financial Resource Strain: Low Risk  (08/01/2020)   Overall Financial Resource Strain (CARDIA)    Difficulty of Paying Living Expenses: Not very hard  Food Insecurity: No Food Insecurity (10/02/2021)   Hunger Vital Sign    Worried About Running Out of Food in the Last Year: Never true    Ran Out of Food in the Last Year: Never true  Transportation Needs: Unmet Transportation Needs (08/27/2021)   PRAPARE - Transportation    Lack of Transportation (Medical): Yes    Lack of Transportation (Non-Medical): Yes  Physical Activity: Inactive (08/01/2020)   Exercise Vital Sign    Days of Exercise per Week: 0 days    Minutes of Exercise per Session: 0 min  Stress: No Stress Concern Present (08/01/2020)   [place]    Feeling of Stress : Only a little  Social Connections: Socially Isolated (08/01/2020)   Social Connection and Isolation Panel [NHANES]    Frequency of Communication with Friends and Family: Twice a week    Frequency of Social Gatherings with Friends and Family: Twice a  week    Attends Religious Services: Never     Active Member of Clubs or Organizations: No    Attends Music therapist: Never    Marital Status: Never married     Family History: The patient's family history includes Asthma in his brother; Cerebrovascular Accident in his father; Congestive Heart Failure in his brother; Hypertension in his father and mother. There is no history of Stomach cancer, Rectal cancer, Esophageal cancer, or Colon cancer.  ROS:   Please see the history of present illness.     All other systems reviewed and are negative.  EKGs/Labs/Other Studies Reviewed:    The following studies were reviewed today:  EKG:   09/24/21: SR with LVH and secondary repolarization 08/28/20: SR 1st HB  Cardiac Event Monitoring: Date: 08/01/2019 Results: Patient had a min HR of 53 bpm, max HR of 148 bpm, and avg HR of 62 BPM. 7 Supraventricular Tachycardia runs occurred, the longest lasting 18 beats with an avg rate of 112 bpm.   Predominant underlying rhythm was Sinus Rhythm. Few very short runs of SVTs that don't correlate with patient's symptoms. Rare PVCs, PACs, 1 blocked sinus beat.   Continue the same management.   ECHO COMPLETE WO IMAGING ENHANCING AGENT 09/15/2021  Narrative ECHOCARDIOGRAM REPORT    Patient Name:   JOVIN FESTER [facility] Date of Exam: 09/15/2021 Medical Rec #:  [id number]           Height:       65.0 in Accession #:    [id number]          Weight:       187.0 lb Date of Birth:  Jan 07, 1966           BSA:          1.922 m Patient Age:    50 years            BP:           170/100 mmHg Patient Gender: M                   HR:           56 bpm. Exam Location:  Inpatient  Procedure: 2D Echo  Indications:    stroke  History:        Patient has prior history of Echocardiogram examinations, most recent 09/13/2020. COPD and end stage renal disease, Arrythmias:first degree heart block; Risk Factors:Hypertension and Sleep Apnea.  Sonographer:    Johny Chess RDCS Referring Phys: [place]   1. Left ventricular ejection fraction, by estimation, is 55 to 60%. The left ventricle has normal function. The left ventricle has no regional wall motion abnormalities. There is moderate concentric left ventricular hypertrophy. Left ventricular diastolic parameters are consistent with Grade III diastolic dysfunction (restrictive). Elevated left atrial pressure. 2. Right ventricular systolic function is normal. The right ventricular size is normal. There is severely elevated pulmonary artery systolic pressure. The estimated right ventricular systolic pressure is 61.6 mmHg. 3. Left atrial size was severely dilated. 4. The mitral valve is normal in structure. Mild to moderate mitral valve regurgitation. No evidence of mitral stenosis. 5. Tricuspid valve regurgitation is mild to moderate. 6. The aortic valve is tricuspid. Aortic valve regurgitation is not visualized. No aortic stenosis is present. 7. The inferior vena cava is dilated in size with <50% respiratory variability, suggesting right atrial pressure of 15 mmHg.  Comparison(s): RVSP is worse than prior; stable LV function.  FINDINGS Left Ventricle: Left ventricular ejection fraction, by estimation, is 55 to 60%. The left ventricle has normal function. The left ventricle has no regional wall motion abnormalities. The left ventricular internal cavity size was normal in size. There is moderate concentric left ventricular hypertrophy. Left ventricular diastolic parameters are consistent with Grade III diastolic dysfunction (restrictive). Elevated left atrial pressure.  Right Ventricle: The right ventricular size is normal. No increase in right ventricular wall thickness. Right ventricular systolic function is normal. There is severely elevated pulmonary artery systolic pressure. The tricuspid regurgitant velocity is 4.36 m/s, and with an assumed right atrial pressure of 15 mmHg, the estimated right ventricular  systolic pressure is 77.8 mmHg.  Left Atrium: Left atrial size was severely dilated.  Right Atrium: Right atrial size was normal in size.  Pericardium: Trivial pericardial effusion is present.  Mitral Valve: The mitral valve is normal in structure. Mild mitral annular calcification. Mild to moderate mitral valve regurgitation. No evidence of mitral valve stenosis.  Tricuspid Valve: The tricuspid valve is normal in structure. Tricuspid valve regurgitation is mild to moderate. No evidence of tricuspid stenosis.  Aortic Valve: The aortic valve is tricuspid. Aortic valve regurgitation is not visualized. No aortic stenosis is present.  Pulmonic Valve: The pulmonic valve was normal in structure. Pulmonic valve regurgitation is not visualized. No evidence of pulmonic stenosis.  Aorta: The aortic root and ascending aorta are structurally normal, with no evidence of dilitation.  Venous: The inferior vena cava is dilated in size with less than 50% respiratory variability, suggesting right atrial pressure of 15 mmHg.  IAS/Shunts: No atrial level shunt detected by color flow Doppler.   LEFT VENTRICLE PLAX 2D LVIDd:         5.30 cm Diastology LVIDs:         3.70 cm LV e' medial:    5.33 cm/s LV PW:         1.40 cm LV E/e' medial:  23.8 LV IVS:        1.20 cm LV e' lateral:   4.35 cm/s LV E/e' lateral: 29.2   RIGHT VENTRICLE             IVC RV S prime:     11.90 cm/s  IVC diam: 2.40 cm TAPSE (M-mode): 1.8 cm  LEFT ATRIUM              Index        RIGHT ATRIUM           Index LA diam:        4.80 cm  2.50 cm/m   RA Area:     16.20 cm LA Vol (A2C):   100.0 ml 52.02 ml/m  RA Volume:   41.90 ml  21.80 ml/m LA Vol (A4C):   113.0 ml 58.78 ml/m LA Biplane Vol: 106.0 ml 55.14 ml/m AORTIC VALVE LVOT Vmax:   94.00 cm/s LVOT Vmean:  59.500 cm/s LVOT VTI:    0.209 m  AORTA Ao Asc diam: 3.20 cm  MITRAL VALVE                TRICUSPID VALVE MV Area (PHT): 3.42 cm     TR Peak grad:   76.0  mmHg MV Decel Time: 222 msec     TR Vmax:        436.00 cm/s MR Peak grad: 108.6 mmHg MR Mean grad: 77.0 mmHg     SHUNTS MR Vmax:      521.00 cm/s   Systemic  VTI: 0.21 m MR Vmean:     416.0 cm/s MV E velocity: 127.00 cm/s MV A velocity: 59.50 cm/s MV E/A ratio:  2.13  Rudean Haskell MD Electronically signed by Rudean Haskell MD Signature Date/Time: 09/15/2021/2:42:29 PM    Final     Cardiac CT: Date:03/11/2017 Results: Aortic Atherosclerosis 3VC CAC and proximal RCA calcification  Recent Labs: 09/14/2021: Magnesium 2.1; TSH 3.910 09/17/2021: ALT 10 09/27/2021: BUN 37; Creatinine, Ser 8.41; Hemoglobin 7.9; Platelets 179; Potassium 4.3; Sodium 138  Recent Lipid Panel    Component Value Date/Time   CHOL 126 09/14/2021 2138   TRIG 62 09/14/2021 2138   HDL 59 09/14/2021 2138   CHOLHDL 2.1 09/14/2021 2138   VLDL 12 09/14/2021 2138   LDLCALC 55 09/14/2021 2138     Physical Exam:    VS:  BP (!) 152/88   Pulse (!) 52   Ht 5' 5"  (1.651 m)   Wt 178 lb 9.6 oz (81 kg)   SpO2 99%   BMI 29.72 kg/m     Wt Readings from Last 3 Encounters:  10/22/21 178 lb 9.6 oz (81 kg)  10/01/21 176 lb 5.9 oz (80 kg)  09/16/21 176 lb 12.9 oz (80.2 kg)    Gen: no distress Neck: No JVD Cardiac: No Rubs or Gallops, Loud P2 with subtle RV heave, regular bradycardia, +2 radial pulses Respiratory: Clear to auscultation bilaterally, normal effort, normal  respiratory rate GI: Soft, nontender, non-distended  Integument: Skin feels warm Neuro:  At time of evaluation, alert and oriented to person/place/time/situation  Psych: Depressed affect   ASSESSMENT:    1. Chronic heart failure with preserved ejection fraction ([place])   2. Tobacco abuse   3. Essential hypertension   4. ESRD on dialysis ([place])   5. Other secondary pulmonary hypertension (HCC)     PLAN:    Heart Failure Recovered Ejection Fraction  PH II and III (COPD) ESRD HTN  P-SVT on lopressor 100 mg PO BID - no  SGTLi or MRA on HD - does not still make urine - continue Imdur 20 mg BID and will increase hydralazine to 50 mg PO BID (after discussion I do not suspect her will take TID) - continue norvasc - He has worsening pulmonary pressures; he has no evidence of pulmonary arterial hypertension; may need increase in volume removal; will send copy or out not to PCP  Active Tobacco abuse - related to stress, he is precomtemplative to quit  Hx of stroke HLD aortic atherosclerosis and CAC - LDL is 55 continue rosuvastatin - continue plavix 75 mg PO daily  Six months APP One year with me    Medication Adjustments/Labs and Tests Ordered: Current medicines are reviewed at length with the patient today.  Concerns regarding medicines are outlined above.  No orders of the defined types were placed in this encounter.    Meds ordered this encounter  Medications   hydrALAZINE (APRESOLINE) 50 MG tablet    Sig: Take 1 tablet (50 mg total) by mouth 2 (two) times daily.    Dispense:  180 tablet    Refill:  3     Patient Instructions  Medication Instructions:  Your physician has recommended you make the following change in your medication:   INCREASE: Hydralazine to 50 mg by mouth twice daily  *If you need a refill on your cardiac medications before your next appointment, please call your pharmacy*   Lab Work: NONE If you have labs (blood work) drawn today and your tests  are completely normal, you will receive your results only by: MyChart Message (if you have MyChart) OR A paper copy in the mail If you have any lab test that is abnormal or we need to change your treatment, we will call you to review the results.   Testing/Procedures: NONE   Follow-Up: At [facility], you and your health needs are our priority.  As part of our continuing mission to provide you with exceptional heart care, we have created designated Provider Care Teams.  These Care Teams include your primary  Cardiologist (physician) and Advanced Practice Providers (APPs -  Physician Assistants and Nurse Practitioners) who all work together to provide you with the care you need, when you need it.   Your next appointment:   6 month(s)  The format for your next appointment:   In Person  Provider:   Melina Copa, PA-C, Ermalinda Barrios, PA-C, or Christen Bame, NP     Then, Werner Lean, MD will plan to see you again in 1 year(s).    Other Instructions   Important Information About Sugar         Signed, Werner Lean, MD  10/22/2021 9:54 AM    [facility]

## 2021-10-22 ENCOUNTER — Ambulatory Visit (INDEPENDENT_AMBULATORY_CARE_PROVIDER_SITE_OTHER): Payer: Medicare Other | Admitting: Student

## 2021-10-22 ENCOUNTER — Encounter: Payer: Self-pay | Admitting: Internal Medicine

## 2021-10-22 ENCOUNTER — Encounter: Payer: Self-pay | Admitting: Student

## 2021-10-22 ENCOUNTER — Ambulatory Visit: Payer: Medicare Other | Attending: Internal Medicine | Admitting: Internal Medicine

## 2021-10-22 VITALS — BP 152/88 | HR 52 | Ht 65.0 in | Wt 178.6 lb

## 2021-10-22 VITALS — BP 187/87 | HR 54 | Ht 65.0 in | Wt 178.4 lb

## 2021-10-22 DIAGNOSIS — Z09 Encounter for follow-up examination after completed treatment for conditions other than malignant neoplasm: Secondary | ICD-10-CM | POA: Diagnosis not present

## 2021-10-22 DIAGNOSIS — I10 Essential (primary) hypertension: Secondary | ICD-10-CM | POA: Insufficient documentation

## 2021-10-22 DIAGNOSIS — I639 Cerebral infarction, unspecified: Secondary | ICD-10-CM | POA: Insufficient documentation

## 2021-10-22 DIAGNOSIS — I5032 Chronic diastolic (congestive) heart failure: Secondary | ICD-10-CM | POA: Diagnosis not present

## 2021-10-22 DIAGNOSIS — I2729 Other secondary pulmonary hypertension: Secondary | ICD-10-CM | POA: Insufficient documentation

## 2021-10-22 DIAGNOSIS — I110 Hypertensive heart disease with heart failure: Secondary | ICD-10-CM

## 2021-10-22 DIAGNOSIS — I69318 Other symptoms and signs involving cognitive functions following cerebral infarction: Secondary | ICD-10-CM | POA: Diagnosis not present

## 2021-10-22 DIAGNOSIS — I5020 Unspecified systolic (congestive) heart failure: Secondary | ICD-10-CM | POA: Diagnosis not present

## 2021-10-22 DIAGNOSIS — Z720 Tobacco use: Secondary | ICD-10-CM | POA: Diagnosis not present

## 2021-10-22 DIAGNOSIS — N186 End stage renal disease: Secondary | ICD-10-CM | POA: Diagnosis not present

## 2021-10-22 DIAGNOSIS — I2510 Atherosclerotic heart disease of native coronary artery without angina pectoris: Secondary | ICD-10-CM

## 2021-10-22 DIAGNOSIS — Z992 Dependence on renal dialysis: Secondary | ICD-10-CM | POA: Insufficient documentation

## 2021-10-22 DIAGNOSIS — I132 Hypertensive heart and chronic kidney disease with heart failure and with stage 5 chronic kidney disease, or end stage renal disease: Secondary | ICD-10-CM | POA: Diagnosis not present

## 2021-10-22 DIAGNOSIS — I69322 Dysarthria following cerebral infarction: Secondary | ICD-10-CM | POA: Diagnosis not present

## 2021-10-22 DIAGNOSIS — I69354 Hemiplegia and hemiparesis following cerebral infarction affecting left non-dominant side: Secondary | ICD-10-CM | POA: Diagnosis not present

## 2021-10-22 DIAGNOSIS — E1151 Type 2 diabetes mellitus with diabetic peripheral angiopathy without gangrene: Secondary | ICD-10-CM | POA: Diagnosis not present

## 2021-10-22 MED ORDER — HYDRALAZINE HCL 50 MG PO TABS: 50.0000 mg | ORAL_TABLET | Freq: Two times a day (BID) | ORAL | 3 refills | Status: DC

## 2021-10-22 NOTE — Progress Notes (Signed)
  SUBJECTIVE:   CHIEF COMPLAINT / HPI:   Hospital follow-up: Admitted for acute nonhemorrhagic infarct outside of the acute intervention window managed for stroke, anemia of renal disease, ESRD, HTN. Follow-up recommendations per primary team. Completed rehab. No specific concerns today.  PERTINENT  PMH / PSH: ESRD on HD COPD, HTN, HLD, prostate cancer, OSA  OBJECTIVE:  BP (!) 187/87   Pulse (!) 54   Ht 5' 5"  (1.651 m)   Wt 178 lb 6.4 oz (80.9 kg)   SpO2 100%   BMI 29.69 kg/m   General: NAD, pleasant, able to participate in exam Cardiac: RRR, no murmurs auscultated Respiratory: CTAB, normal WOB Psych: Normal affect and mood  ASSESSMENT/PLAN:  Hospital discharge follow-up On discharge please call [facility] to arrange 30-day monitor for the patient. - seen by Cardiology, no 30-day monitor F/u medication list for HTN - verified medications, has not taken morning medication today Continue Plavix monotherapy for stroke ppx - verified Consider recheck CBC - deferred given weekly iron transfusions F/u patient adherence to MWF dialysis regimen - verified Should follow up with Neurology in 4-6 weeks - Referral placed by rehab to neurology.  Not currently scheduled. Provided information to call  Hypertensive heart disease with heart failure (HCC) BP: (!) 187/87 today. Poorly controlled. Goal of <130/80.  Recently adjusted medication management by cardiology today.  Has not taken morning medications.  Medication regimen: Amlodipine 10 mg daily, metoprolol 100 mg twice daily, Isordil 20 mg twice daily, hydralazine recently increased to 50 mg twice daily, clonidine patch weekly  Return in about 1 month (around 11/21/2021) for Annual physical. Wells Guiles, DO 10/22/2021, 1:21 PM PGY-2, [place]

## 2021-10-22 NOTE — Patient Instructions (Signed)
Medication Instructions:  Your physician has recommended you make the following change in your medication:   INCREASE: Hydralazine to 50 mg by mouth twice daily  *If you need a refill on your cardiac medications before your next appointment, please call your pharmacy*   Lab Work: NONE If you have labs (blood work) drawn today and your tests are completely normal, you will receive your results only by: [place] (if you have MyChart) OR A paper copy in the mail If you have any lab test that is abnormal or we need to change your treatment, we will call you to review the results.   Testing/Procedures: NONE   Follow-Up: At [facility], you and your health needs are our priority.  As part of our continuing mission to provide you with exceptional heart care, we have created designated Provider Care Teams.  These Care Teams include your primary Cardiologist (physician) and Advanced Practice Providers (APPs -  Physician Assistants and Nurse Practitioners) who all work together to provide you with the care you need, when you need it.   Your next appointment:   6 month(s)  The format for your next appointment:   In Person  Provider:   Melina Copa, PA-C, Ermalinda Barrios, PA-C, or Christen Bame, NP     Then, Werner Lean, MD will plan to see you again in 1 year(s).    Other Instructions   Important Information About Sugar

## 2021-10-22 NOTE — Assessment & Plan Note (Deleted)
BP: (!) 187/87 today. Poorly controlled. Goal of <130/80.  Recently adjusted medication management by cardiology today.  Has not taken morning medications.  Medication regimen: Amlodipine 10 mg daily, metoprolol 100 mg twice daily, Isordil 20 mg twice daily, hydralazine recently increased to 50 mg twice daily, clonidine patch weekly

## 2021-10-22 NOTE — Patient Instructions (Signed)
It was great to see you today! Thank you for choosing [facility] for your primary care. James Wall was seen for hospital follow-up.  Today we addressed: We have discussed her medications.  Please continue to take your blood pressure medications as your blood pressure is very high without them. Please call the neurology office to get an appointment for stroke follow-up. [facility] [address], [address]  If you haven't already, sign up for My Chart to have easy access to your labs results, and communication with your primary care physician.  I recommend that you always bring your medications to each appointment as this makes it easy to ensure you are on the correct medications and helps [place] not miss refills when you need them. Call the clinic at [phone] if your symptoms worsen or you have any concerns.  You should return to our clinic Return in about 1 month (around 11/21/2021) for Annual physical. Please arrive 15 minutes before your appointment to ensure smooth check in process.  We appreciate your efforts in making this happen.  Thank you for allowing me to participate in your care, Wells Guiles, DO 10/22/2021, 11:00 AM PGY-2, [place]

## 2021-10-22 NOTE — Assessment & Plan Note (Addendum)
BP: (!) 187/87 today. Poorly controlled. Goal of <130/80.  Recently adjusted medication management by cardiology today.  Has not taken morning medications.  Medication regimen: Amlodipine 10 mg daily, metoprolol 100 mg twice daily, Isordil 20 mg twice daily, hydralazine recently increased to 50 mg twice daily, clonidine patch weekly

## 2021-10-22 NOTE — Assessment & Plan Note (Signed)
1. On discharge please call [place] to arrange 30-day monitor for the patient. - seen by Cardiology, no 30-day monitor 2. F/u medication list for HTN - verified medications, has not taken morning medication today 3. Continue Plavix monotherapy for stroke ppx - verified 4. Consider recheck CBC - deferred given weekly iron transfusions 5. F/u patient adherence to MWF dialysis regimen - verified 6. Should follow up with Neurology in 4-6 weeks - Referral placed by rehab to neurology.  Not currently scheduled. Provided information to call

## 2021-10-23 DIAGNOSIS — N186 End stage renal disease: Secondary | ICD-10-CM | POA: Diagnosis not present

## 2021-10-23 DIAGNOSIS — N2581 Secondary hyperparathyroidism of renal origin: Secondary | ICD-10-CM | POA: Diagnosis not present

## 2021-10-23 DIAGNOSIS — D509 Iron deficiency anemia, unspecified: Secondary | ICD-10-CM | POA: Diagnosis not present

## 2021-10-23 DIAGNOSIS — J158 Pneumonia due to other specified bacteria: Secondary | ICD-10-CM | POA: Diagnosis not present

## 2021-10-23 DIAGNOSIS — D631 Anemia in chronic kidney disease: Secondary | ICD-10-CM | POA: Diagnosis not present

## 2021-10-23 DIAGNOSIS — Z992 Dependence on renal dialysis: Secondary | ICD-10-CM | POA: Diagnosis not present

## 2021-10-24 DIAGNOSIS — I5020 Unspecified systolic (congestive) heart failure: Secondary | ICD-10-CM | POA: Diagnosis not present

## 2021-10-24 DIAGNOSIS — C61 Malignant neoplasm of prostate: Secondary | ICD-10-CM | POA: Diagnosis not present

## 2021-10-24 DIAGNOSIS — R3915 Urgency of urination: Secondary | ICD-10-CM | POA: Diagnosis not present

## 2021-10-24 DIAGNOSIS — I69322 Dysarthria following cerebral infarction: Secondary | ICD-10-CM | POA: Diagnosis not present

## 2021-10-24 DIAGNOSIS — I69318 Other symptoms and signs involving cognitive functions following cerebral infarction: Secondary | ICD-10-CM | POA: Diagnosis not present

## 2021-10-24 DIAGNOSIS — I132 Hypertensive heart and chronic kidney disease with heart failure and with stage 5 chronic kidney disease, or end stage renal disease: Secondary | ICD-10-CM | POA: Diagnosis not present

## 2021-10-24 DIAGNOSIS — Q612 Polycystic kidney, adult type: Secondary | ICD-10-CM | POA: Diagnosis not present

## 2021-10-24 DIAGNOSIS — E1151 Type 2 diabetes mellitus with diabetic peripheral angiopathy without gangrene: Secondary | ICD-10-CM | POA: Diagnosis not present

## 2021-10-24 DIAGNOSIS — I69354 Hemiplegia and hemiparesis following cerebral infarction affecting left non-dominant side: Secondary | ICD-10-CM | POA: Diagnosis not present

## 2021-10-24 DIAGNOSIS — C778 Secondary and unspecified malignant neoplasm of lymph nodes of multiple regions: Secondary | ICD-10-CM | POA: Diagnosis not present

## 2021-10-25 DIAGNOSIS — D631 Anemia in chronic kidney disease: Secondary | ICD-10-CM | POA: Diagnosis not present

## 2021-10-25 DIAGNOSIS — N2581 Secondary hyperparathyroidism of renal origin: Secondary | ICD-10-CM | POA: Diagnosis not present

## 2021-10-25 DIAGNOSIS — Z992 Dependence on renal dialysis: Secondary | ICD-10-CM | POA: Diagnosis not present

## 2021-10-25 DIAGNOSIS — J158 Pneumonia due to other specified bacteria: Secondary | ICD-10-CM | POA: Diagnosis not present

## 2021-10-25 DIAGNOSIS — N186 End stage renal disease: Secondary | ICD-10-CM | POA: Diagnosis not present

## 2021-10-25 DIAGNOSIS — D509 Iron deficiency anemia, unspecified: Secondary | ICD-10-CM | POA: Diagnosis not present

## 2021-10-28 DIAGNOSIS — D509 Iron deficiency anemia, unspecified: Secondary | ICD-10-CM | POA: Diagnosis not present

## 2021-10-28 DIAGNOSIS — D631 Anemia in chronic kidney disease: Secondary | ICD-10-CM | POA: Diagnosis not present

## 2021-10-28 DIAGNOSIS — N186 End stage renal disease: Secondary | ICD-10-CM | POA: Diagnosis not present

## 2021-10-28 DIAGNOSIS — N2581 Secondary hyperparathyroidism of renal origin: Secondary | ICD-10-CM | POA: Diagnosis not present

## 2021-10-28 DIAGNOSIS — Z992 Dependence on renal dialysis: Secondary | ICD-10-CM | POA: Diagnosis not present

## 2021-10-28 DIAGNOSIS — J158 Pneumonia due to other specified bacteria: Secondary | ICD-10-CM | POA: Diagnosis not present

## 2021-10-29 ENCOUNTER — Encounter: Payer: Medicare Other | Attending: Physical Medicine and Rehabilitation | Admitting: Registered Nurse

## 2021-10-29 DIAGNOSIS — I132 Hypertensive heart and chronic kidney disease with heart failure and with stage 5 chronic kidney disease, or end stage renal disease: Secondary | ICD-10-CM | POA: Diagnosis not present

## 2021-10-29 DIAGNOSIS — I69322 Dysarthria following cerebral infarction: Secondary | ICD-10-CM | POA: Diagnosis not present

## 2021-10-29 DIAGNOSIS — I69318 Other symptoms and signs involving cognitive functions following cerebral infarction: Secondary | ICD-10-CM | POA: Diagnosis not present

## 2021-10-29 DIAGNOSIS — E1151 Type 2 diabetes mellitus with diabetic peripheral angiopathy without gangrene: Secondary | ICD-10-CM | POA: Diagnosis not present

## 2021-10-29 DIAGNOSIS — I5020 Unspecified systolic (congestive) heart failure: Secondary | ICD-10-CM | POA: Diagnosis not present

## 2021-10-29 DIAGNOSIS — I69354 Hemiplegia and hemiparesis following cerebral infarction affecting left non-dominant side: Secondary | ICD-10-CM | POA: Diagnosis not present

## 2021-10-30 ENCOUNTER — Other Ambulatory Visit: Payer: Self-pay

## 2021-10-30 DIAGNOSIS — N2581 Secondary hyperparathyroidism of renal origin: Secondary | ICD-10-CM | POA: Diagnosis not present

## 2021-10-30 DIAGNOSIS — D509 Iron deficiency anemia, unspecified: Secondary | ICD-10-CM | POA: Diagnosis not present

## 2021-10-30 DIAGNOSIS — J158 Pneumonia due to other specified bacteria: Secondary | ICD-10-CM | POA: Diagnosis not present

## 2021-10-30 DIAGNOSIS — Z992 Dependence on renal dialysis: Secondary | ICD-10-CM | POA: Diagnosis not present

## 2021-10-30 DIAGNOSIS — N186 End stage renal disease: Secondary | ICD-10-CM | POA: Diagnosis not present

## 2021-10-30 DIAGNOSIS — D631 Anemia in chronic kidney disease: Secondary | ICD-10-CM | POA: Diagnosis not present

## 2021-10-30 MED ORDER — CLOPIDOGREL BISULFATE 75 MG PO TABS: 75.0000 mg | ORAL_TABLET | Freq: Every day | ORAL | 11 refills | Status: DC

## 2021-10-30 MED ORDER — PANTOPRAZOLE SODIUM 20 MG PO TBEC: 20.0000 mg | DELAYED_RELEASE_TABLET | Freq: Every day | ORAL | 5 refills | Status: DC

## 2021-10-31 DIAGNOSIS — I69354 Hemiplegia and hemiparesis following cerebral infarction affecting left non-dominant side: Secondary | ICD-10-CM | POA: Diagnosis not present

## 2021-10-31 DIAGNOSIS — I69318 Other symptoms and signs involving cognitive functions following cerebral infarction: Secondary | ICD-10-CM | POA: Diagnosis not present

## 2021-10-31 DIAGNOSIS — E1151 Type 2 diabetes mellitus with diabetic peripheral angiopathy without gangrene: Secondary | ICD-10-CM | POA: Diagnosis not present

## 2021-10-31 DIAGNOSIS — I5020 Unspecified systolic (congestive) heart failure: Secondary | ICD-10-CM | POA: Diagnosis not present

## 2021-10-31 DIAGNOSIS — I132 Hypertensive heart and chronic kidney disease with heart failure and with stage 5 chronic kidney disease, or end stage renal disease: Secondary | ICD-10-CM | POA: Diagnosis not present

## 2021-10-31 DIAGNOSIS — I69322 Dysarthria following cerebral infarction: Secondary | ICD-10-CM | POA: Diagnosis not present

## 2021-11-01 DIAGNOSIS — Z992 Dependence on renal dialysis: Secondary | ICD-10-CM | POA: Diagnosis not present

## 2021-11-01 DIAGNOSIS — D631 Anemia in chronic kidney disease: Secondary | ICD-10-CM | POA: Diagnosis not present

## 2021-11-01 DIAGNOSIS — N2581 Secondary hyperparathyroidism of renal origin: Secondary | ICD-10-CM | POA: Diagnosis not present

## 2021-11-01 DIAGNOSIS — D509 Iron deficiency anemia, unspecified: Secondary | ICD-10-CM | POA: Diagnosis not present

## 2021-11-01 DIAGNOSIS — N186 End stage renal disease: Secondary | ICD-10-CM | POA: Diagnosis not present

## 2021-11-01 DIAGNOSIS — J158 Pneumonia due to other specified bacteria: Secondary | ICD-10-CM | POA: Diagnosis not present

## 2021-11-02 DIAGNOSIS — I132 Hypertensive heart and chronic kidney disease with heart failure and with stage 5 chronic kidney disease, or end stage renal disease: Secondary | ICD-10-CM | POA: Diagnosis not present

## 2021-11-02 DIAGNOSIS — F32A Depression, unspecified: Secondary | ICD-10-CM | POA: Diagnosis not present

## 2021-11-02 DIAGNOSIS — N186 End stage renal disease: Secondary | ICD-10-CM | POA: Diagnosis not present

## 2021-11-02 DIAGNOSIS — I5020 Unspecified systolic (congestive) heart failure: Secondary | ICD-10-CM | POA: Diagnosis not present

## 2021-11-02 DIAGNOSIS — N400 Enlarged prostate without lower urinary tract symptoms: Secondary | ICD-10-CM | POA: Diagnosis not present

## 2021-11-02 DIAGNOSIS — I428 Other cardiomyopathies: Secondary | ICD-10-CM | POA: Diagnosis not present

## 2021-11-02 DIAGNOSIS — D631 Anemia in chronic kidney disease: Secondary | ICD-10-CM | POA: Diagnosis not present

## 2021-11-02 DIAGNOSIS — J449 Chronic obstructive pulmonary disease, unspecified: Secondary | ICD-10-CM | POA: Diagnosis not present

## 2021-11-02 DIAGNOSIS — K7460 Unspecified cirrhosis of liver: Secondary | ICD-10-CM | POA: Diagnosis not present

## 2021-11-02 DIAGNOSIS — F411 Generalized anxiety disorder: Secondary | ICD-10-CM | POA: Diagnosis not present

## 2021-11-02 DIAGNOSIS — I440 Atrioventricular block, first degree: Secondary | ICD-10-CM | POA: Diagnosis not present

## 2021-11-02 DIAGNOSIS — D509 Iron deficiency anemia, unspecified: Secondary | ICD-10-CM | POA: Diagnosis not present

## 2021-11-02 DIAGNOSIS — N2581 Secondary hyperparathyroidism of renal origin: Secondary | ICD-10-CM | POA: Diagnosis not present

## 2021-11-02 DIAGNOSIS — I69354 Hemiplegia and hemiparesis following cerebral infarction affecting left non-dominant side: Secondary | ICD-10-CM | POA: Diagnosis not present

## 2021-11-02 DIAGNOSIS — I471 Supraventricular tachycardia: Secondary | ICD-10-CM | POA: Diagnosis not present

## 2021-11-02 DIAGNOSIS — K449 Diaphragmatic hernia without obstruction or gangrene: Secondary | ICD-10-CM | POA: Diagnosis not present

## 2021-11-02 DIAGNOSIS — E1151 Type 2 diabetes mellitus with diabetic peripheral angiopathy without gangrene: Secondary | ICD-10-CM | POA: Diagnosis not present

## 2021-11-02 DIAGNOSIS — I69322 Dysarthria following cerebral infarction: Secondary | ICD-10-CM | POA: Diagnosis not present

## 2021-11-02 DIAGNOSIS — I69318 Other symptoms and signs involving cognitive functions following cerebral infarction: Secondary | ICD-10-CM | POA: Diagnosis not present

## 2021-11-02 DIAGNOSIS — M1990 Unspecified osteoarthritis, unspecified site: Secondary | ICD-10-CM | POA: Diagnosis not present

## 2021-11-02 DIAGNOSIS — D630 Anemia in neoplastic disease: Secondary | ICD-10-CM | POA: Diagnosis not present

## 2021-11-02 DIAGNOSIS — G4733 Obstructive sleep apnea (adult) (pediatric): Secondary | ICD-10-CM | POA: Diagnosis not present

## 2021-11-02 DIAGNOSIS — C61 Malignant neoplasm of prostate: Secondary | ICD-10-CM | POA: Diagnosis not present

## 2021-11-02 DIAGNOSIS — F5109 Other insomnia not due to a substance or known physiological condition: Secondary | ICD-10-CM | POA: Diagnosis not present

## 2021-11-02 DIAGNOSIS — E1122 Type 2 diabetes mellitus with diabetic chronic kidney disease: Secondary | ICD-10-CM | POA: Diagnosis not present

## 2021-11-03 DIAGNOSIS — I12 Hypertensive chronic kidney disease with 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: Secondary | ICD-10-CM | POA: Diagnosis not present

## 2021-11-03 DIAGNOSIS — N186 End stage renal disease: Secondary | ICD-10-CM | POA: Diagnosis not present

## 2021-11-03 DIAGNOSIS — Z992 Dependence on renal dialysis: Secondary | ICD-10-CM | POA: Diagnosis not present

## 2021-11-04 DIAGNOSIS — Z992 Dependence on renal dialysis: Secondary | ICD-10-CM | POA: Diagnosis not present

## 2021-11-04 DIAGNOSIS — N186 End stage renal disease: Secondary | ICD-10-CM | POA: Diagnosis not present

## 2021-11-04 DIAGNOSIS — D631 Anemia in chronic kidney disease: Secondary | ICD-10-CM | POA: Diagnosis not present

## 2021-11-04 DIAGNOSIS — J158 Pneumonia due to other specified bacteria: Secondary | ICD-10-CM | POA: Diagnosis not present

## 2021-11-04 DIAGNOSIS — D509 Iron deficiency anemia, unspecified: Secondary | ICD-10-CM | POA: Diagnosis not present

## 2021-11-04 DIAGNOSIS — N2581 Secondary hyperparathyroidism of renal origin: Secondary | ICD-10-CM | POA: Diagnosis not present

## 2021-11-06 ENCOUNTER — Other Ambulatory Visit: Payer: Self-pay

## 2021-11-06 DIAGNOSIS — N186 End stage renal disease: Secondary | ICD-10-CM | POA: Diagnosis not present

## 2021-11-06 DIAGNOSIS — Z992 Dependence on renal dialysis: Secondary | ICD-10-CM | POA: Diagnosis not present

## 2021-11-06 DIAGNOSIS — J158 Pneumonia due to other specified bacteria: Secondary | ICD-10-CM | POA: Diagnosis not present

## 2021-11-06 DIAGNOSIS — N2581 Secondary hyperparathyroidism of renal origin: Secondary | ICD-10-CM | POA: Diagnosis not present

## 2021-11-06 DIAGNOSIS — D631 Anemia in chronic kidney disease: Secondary | ICD-10-CM | POA: Diagnosis not present

## 2021-11-06 DIAGNOSIS — D509 Iron deficiency anemia, unspecified: Secondary | ICD-10-CM | POA: Diagnosis not present

## 2021-11-06 MED ORDER — ROSUVASTATIN CALCIUM 10 MG PO TABS: 10.0000 mg | ORAL_TABLET | Freq: Every day | ORAL | 11 refills | Status: DC

## 2021-11-07 DIAGNOSIS — I69322 Dysarthria following cerebral infarction: Secondary | ICD-10-CM | POA: Diagnosis not present

## 2021-11-07 DIAGNOSIS — I5020 Unspecified systolic (congestive) heart failure: Secondary | ICD-10-CM | POA: Diagnosis not present

## 2021-11-07 DIAGNOSIS — I132 Hypertensive heart and chronic kidney disease with heart failure and with stage 5 chronic kidney disease, or end stage renal disease: Secondary | ICD-10-CM | POA: Diagnosis not present

## 2021-11-07 DIAGNOSIS — E1151 Type 2 diabetes mellitus with diabetic peripheral angiopathy without gangrene: Secondary | ICD-10-CM | POA: Diagnosis not present

## 2021-11-07 DIAGNOSIS — I69354 Hemiplegia and hemiparesis following cerebral infarction affecting left non-dominant side: Secondary | ICD-10-CM | POA: Diagnosis not present

## 2021-11-07 DIAGNOSIS — I69318 Other symptoms and signs involving cognitive functions following cerebral infarction: Secondary | ICD-10-CM | POA: Diagnosis not present

## 2021-11-08 DIAGNOSIS — N186 End stage renal disease: Secondary | ICD-10-CM | POA: Diagnosis not present

## 2021-11-08 DIAGNOSIS — N2581 Secondary hyperparathyroidism of renal origin: Secondary | ICD-10-CM | POA: Diagnosis not present

## 2021-11-08 DIAGNOSIS — D509 Iron deficiency anemia, unspecified: Secondary | ICD-10-CM | POA: Diagnosis not present

## 2021-11-08 DIAGNOSIS — J158 Pneumonia due to other specified bacteria: Secondary | ICD-10-CM | POA: Diagnosis not present

## 2021-11-08 DIAGNOSIS — D631 Anemia in chronic kidney disease: Secondary | ICD-10-CM | POA: Diagnosis not present

## 2021-11-08 DIAGNOSIS — Z992 Dependence on renal dialysis: Secondary | ICD-10-CM | POA: Diagnosis not present

## 2021-11-11 DIAGNOSIS — N2581 Secondary hyperparathyroidism of renal origin: Secondary | ICD-10-CM | POA: Diagnosis not present

## 2021-11-11 DIAGNOSIS — N186 End stage renal disease: Secondary | ICD-10-CM | POA: Diagnosis not present

## 2021-11-11 DIAGNOSIS — J158 Pneumonia due to other specified bacteria: Secondary | ICD-10-CM | POA: Diagnosis not present

## 2021-11-11 DIAGNOSIS — D631 Anemia in chronic kidney disease: Secondary | ICD-10-CM | POA: Diagnosis not present

## 2021-11-11 DIAGNOSIS — Z992 Dependence on renal dialysis: Secondary | ICD-10-CM | POA: Diagnosis not present

## 2021-11-11 DIAGNOSIS — D509 Iron deficiency anemia, unspecified: Secondary | ICD-10-CM | POA: Diagnosis not present

## 2021-11-12 ENCOUNTER — Encounter: Payer: Self-pay | Admitting: *Deleted

## 2021-11-12 DIAGNOSIS — I69354 Hemiplegia and hemiparesis following cerebral infarction affecting left non-dominant side: Secondary | ICD-10-CM | POA: Diagnosis not present

## 2021-11-12 DIAGNOSIS — I69322 Dysarthria following cerebral infarction: Secondary | ICD-10-CM | POA: Diagnosis not present

## 2021-11-12 DIAGNOSIS — I5020 Unspecified systolic (congestive) heart failure: Secondary | ICD-10-CM | POA: Diagnosis not present

## 2021-11-12 DIAGNOSIS — E1151 Type 2 diabetes mellitus with diabetic peripheral angiopathy without gangrene: Secondary | ICD-10-CM | POA: Diagnosis not present

## 2021-11-12 DIAGNOSIS — I69318 Other symptoms and signs involving cognitive functions following cerebral infarction: Secondary | ICD-10-CM | POA: Diagnosis not present

## 2021-11-12 DIAGNOSIS — I132 Hypertensive heart and chronic kidney disease with heart failure and with stage 5 chronic kidney disease, or end stage renal disease: Secondary | ICD-10-CM | POA: Diagnosis not present

## 2021-11-13 DIAGNOSIS — D509 Iron deficiency anemia, unspecified: Secondary | ICD-10-CM | POA: Diagnosis not present

## 2021-11-13 DIAGNOSIS — N186 End stage renal disease: Secondary | ICD-10-CM | POA: Diagnosis not present

## 2021-11-13 DIAGNOSIS — J158 Pneumonia due to other specified bacteria: Secondary | ICD-10-CM | POA: Diagnosis not present

## 2021-11-13 DIAGNOSIS — N2581 Secondary hyperparathyroidism of renal origin: Secondary | ICD-10-CM | POA: Diagnosis not present

## 2021-11-13 DIAGNOSIS — Z992 Dependence on renal dialysis: Secondary | ICD-10-CM | POA: Diagnosis not present

## 2021-11-13 DIAGNOSIS — D631 Anemia in chronic kidney disease: Secondary | ICD-10-CM | POA: Diagnosis not present

## 2021-11-14 ENCOUNTER — Ambulatory Visit: Payer: Self-pay

## 2021-11-14 NOTE — Patient Outreach (Signed)
  Care Coordination   Follow Up Visit Note   11/14/2021 Name: James Wall MRN: [id number] DOB: 04-Oct-1965  James Wall is a 56 y.o. year old male who sees Wells Guiles, DO for primary care. I spoke with  James Wall by phone today.  What matters to the patients health and wellness today?  I am doing fair.    Goals Addressed             This Visit's Progress    I was just out of the hospital for stroke       Care Coordination Interventions: Evaluation of current treatment plan related to patient's adherence to plan as established by provider Reviewed Importance of taking all medications as prescribed Assessed for signs and symptoms of stroke Assessed for management of bladder and/or bowel incontinence Active listening / Reflection utilized  Emotional Support Provided Problem [place] strategies reviewed According to James Wall, he has received the results from his oncology check-up, and his numbers were low, which is good news. Per the patient, he will begin to take hormone shots. Additionally, he reports that his blood pressure has been between 120/81 and 130/80, varying. He denies experiencing any chest pain, swelling, or shortness of breath but still has some residual left-sided weakness. I advised him to continue to take his medications and go to appointments as scheduled.           SDOH assessments and interventions completed:  No     Care Coordination Interventions Activated:  Yes  Care Coordination Interventions:  Yes, provided   Follow up plan: Follow up call scheduled for 11/14 2 pm   Encounter Outcome:  Pt. Visit Completed   James Arms RN, BSN, [place] [facility]   Phone: [phone]

## 2021-11-14 NOTE — Patient Instructions (Signed)
Visit Information  Thank you for taking time to visit with me today. Please don't hesitate to contact me if I can be of assistance to you.   Following are the goals we discussed today:   Goals Addressed             This Visit's Progress    I was just out of the hospital for stroke       Care Coordination Interventions: Evaluation of current treatment plan related to patient's adherence to plan as established by provider Reviewed Importance of taking all medications as prescribed Assessed for signs and symptoms of stroke Assessed for management of bladder and/or bowel incontinence Active listening / Reflection utilized  Emotional Support Provided Problem [place] strategies reviewed According to [name], he has received the results from his oncology check-up, and his numbers were low, which is good news. Per the patient, he will begin to take hormone shots. Additionally, he reports that his blood pressure has been between 120/81 and 130/80, varying. He denies experiencing any chest pain, swelling, or shortness of breath but still has some residual left-sided weakness. I advised him to continue to take his medications and go to appointments as scheduled.           Our next appointment is by telephone on 11/14 at 2 pm  Please call the care guide team at [phone] if you need to cancel or reschedule your appointment.   If you are experiencing a Mental Health or [place] or need someone to talk to, please call 1-800-273-TALK (toll free, 24 hour hotline)  Patient verbalizes understanding of instructions and care plan provided today and agrees to view in [place]. Active MyChart status and patient understanding of how to access instructions and care plan via MyChart confirmed with patient.     Lazaro Arms RN, BSN, [place] [facility]   Phone: [phone]

## 2021-11-15 DIAGNOSIS — Z992 Dependence on renal dialysis: Secondary | ICD-10-CM | POA: Diagnosis not present

## 2021-11-15 DIAGNOSIS — J158 Pneumonia due to other specified bacteria: Secondary | ICD-10-CM | POA: Diagnosis not present

## 2021-11-15 DIAGNOSIS — D509 Iron deficiency anemia, unspecified: Secondary | ICD-10-CM | POA: Diagnosis not present

## 2021-11-15 DIAGNOSIS — D631 Anemia in chronic kidney disease: Secondary | ICD-10-CM | POA: Diagnosis not present

## 2021-11-15 DIAGNOSIS — N186 End stage renal disease: Secondary | ICD-10-CM | POA: Diagnosis not present

## 2021-11-15 DIAGNOSIS — N2581 Secondary hyperparathyroidism of renal origin: Secondary | ICD-10-CM | POA: Diagnosis not present

## 2021-11-18 DIAGNOSIS — N186 End stage renal disease: Secondary | ICD-10-CM | POA: Diagnosis not present

## 2021-11-18 DIAGNOSIS — D509 Iron deficiency anemia, unspecified: Secondary | ICD-10-CM | POA: Diagnosis not present

## 2021-11-18 DIAGNOSIS — Z992 Dependence on renal dialysis: Secondary | ICD-10-CM | POA: Diagnosis not present

## 2021-11-18 DIAGNOSIS — J158 Pneumonia due to other specified bacteria: Secondary | ICD-10-CM | POA: Diagnosis not present

## 2021-11-18 DIAGNOSIS — N2581 Secondary hyperparathyroidism of renal origin: Secondary | ICD-10-CM | POA: Diagnosis not present

## 2021-11-18 DIAGNOSIS — D631 Anemia in chronic kidney disease: Secondary | ICD-10-CM | POA: Diagnosis not present

## 2021-11-20 DIAGNOSIS — N2581 Secondary hyperparathyroidism of renal origin: Secondary | ICD-10-CM | POA: Diagnosis not present

## 2021-11-20 DIAGNOSIS — Z992 Dependence on renal dialysis: Secondary | ICD-10-CM | POA: Diagnosis not present

## 2021-11-20 DIAGNOSIS — D509 Iron deficiency anemia, unspecified: Secondary | ICD-10-CM | POA: Diagnosis not present

## 2021-11-20 DIAGNOSIS — J158 Pneumonia due to other specified bacteria: Secondary | ICD-10-CM | POA: Diagnosis not present

## 2021-11-20 DIAGNOSIS — D631 Anemia in chronic kidney disease: Secondary | ICD-10-CM | POA: Diagnosis not present

## 2021-11-20 DIAGNOSIS — N186 End stage renal disease: Secondary | ICD-10-CM | POA: Diagnosis not present

## 2021-11-21 ENCOUNTER — Telehealth: Payer: Self-pay

## 2021-11-21 DIAGNOSIS — I69322 Dysarthria following cerebral infarction: Secondary | ICD-10-CM | POA: Diagnosis not present

## 2021-11-21 DIAGNOSIS — I69354 Hemiplegia and hemiparesis following cerebral infarction affecting left non-dominant side: Secondary | ICD-10-CM | POA: Diagnosis not present

## 2021-11-21 DIAGNOSIS — I69318 Other symptoms and signs involving cognitive functions following cerebral infarction: Secondary | ICD-10-CM | POA: Diagnosis not present

## 2021-11-21 DIAGNOSIS — E1151 Type 2 diabetes mellitus with diabetic peripheral angiopathy without gangrene: Secondary | ICD-10-CM | POA: Diagnosis not present

## 2021-11-21 DIAGNOSIS — I132 Hypertensive heart and chronic kidney disease with heart failure and with stage 5 chronic kidney disease, or end stage renal disease: Secondary | ICD-10-CM | POA: Diagnosis not present

## 2021-11-21 DIAGNOSIS — I5020 Unspecified systolic (congestive) heart failure: Secondary | ICD-10-CM | POA: Diagnosis not present

## 2021-11-21 NOTE — Telephone Encounter (Signed)
Staff wanted to communicate resting heart rate of 53 for patient. Patient stated he felt fine and was asymptomatic.   Wanted to notify our clinic as she was unsure if this was normal for patient

## 2021-11-21 NOTE — Telephone Encounter (Signed)
Let Mallory know that patient did not comply with appts. She states he has been discharged from [facility] at this time

## 2021-11-22 DIAGNOSIS — N186 End stage renal disease: Secondary | ICD-10-CM | POA: Diagnosis not present

## 2021-11-22 DIAGNOSIS — D509 Iron deficiency anemia, unspecified: Secondary | ICD-10-CM | POA: Diagnosis not present

## 2021-11-22 DIAGNOSIS — J158 Pneumonia due to other specified bacteria: Secondary | ICD-10-CM | POA: Diagnosis not present

## 2021-11-22 DIAGNOSIS — Z992 Dependence on renal dialysis: Secondary | ICD-10-CM | POA: Diagnosis not present

## 2021-11-22 DIAGNOSIS — D631 Anemia in chronic kidney disease: Secondary | ICD-10-CM | POA: Diagnosis not present

## 2021-11-22 DIAGNOSIS — N2581 Secondary hyperparathyroidism of renal origin: Secondary | ICD-10-CM | POA: Diagnosis not present

## 2021-11-25 DIAGNOSIS — N2581 Secondary hyperparathyroidism of renal origin: Secondary | ICD-10-CM | POA: Diagnosis not present

## 2021-11-25 DIAGNOSIS — D509 Iron deficiency anemia, unspecified: Secondary | ICD-10-CM | POA: Diagnosis not present

## 2021-11-25 DIAGNOSIS — J158 Pneumonia due to other specified bacteria: Secondary | ICD-10-CM | POA: Diagnosis not present

## 2021-11-25 DIAGNOSIS — D631 Anemia in chronic kidney disease: Secondary | ICD-10-CM | POA: Diagnosis not present

## 2021-11-25 DIAGNOSIS — Z992 Dependence on renal dialysis: Secondary | ICD-10-CM | POA: Diagnosis not present

## 2021-11-25 DIAGNOSIS — N186 End stage renal disease: Secondary | ICD-10-CM | POA: Diagnosis not present

## 2021-11-27 DIAGNOSIS — Z992 Dependence on renal dialysis: Secondary | ICD-10-CM | POA: Diagnosis not present

## 2021-11-27 DIAGNOSIS — D509 Iron deficiency anemia, unspecified: Secondary | ICD-10-CM | POA: Diagnosis not present

## 2021-11-27 DIAGNOSIS — D631 Anemia in chronic kidney disease: Secondary | ICD-10-CM | POA: Diagnosis not present

## 2021-11-27 DIAGNOSIS — J158 Pneumonia due to other specified bacteria: Secondary | ICD-10-CM | POA: Diagnosis not present

## 2021-11-27 DIAGNOSIS — N186 End stage renal disease: Secondary | ICD-10-CM | POA: Diagnosis not present

## 2021-11-27 DIAGNOSIS — N2581 Secondary hyperparathyroidism of renal origin: Secondary | ICD-10-CM | POA: Diagnosis not present

## 2021-11-29 DIAGNOSIS — Z992 Dependence on renal dialysis: Secondary | ICD-10-CM | POA: Diagnosis not present

## 2021-11-29 DIAGNOSIS — J158 Pneumonia due to other specified bacteria: Secondary | ICD-10-CM | POA: Diagnosis not present

## 2021-11-29 DIAGNOSIS — N186 End stage renal disease: Secondary | ICD-10-CM | POA: Diagnosis not present

## 2021-11-29 DIAGNOSIS — D509 Iron deficiency anemia, unspecified: Secondary | ICD-10-CM | POA: Diagnosis not present

## 2021-11-29 DIAGNOSIS — N2581 Secondary hyperparathyroidism of renal origin: Secondary | ICD-10-CM | POA: Diagnosis not present

## 2021-11-29 DIAGNOSIS — D631 Anemia in chronic kidney disease: Secondary | ICD-10-CM | POA: Diagnosis not present

## 2021-12-02 DIAGNOSIS — Z992 Dependence on renal dialysis: Secondary | ICD-10-CM | POA: Diagnosis not present

## 2021-12-02 DIAGNOSIS — N186 End stage renal disease: Secondary | ICD-10-CM | POA: Diagnosis not present

## 2021-12-02 DIAGNOSIS — N2581 Secondary hyperparathyroidism of renal origin: Secondary | ICD-10-CM | POA: Diagnosis not present

## 2021-12-02 DIAGNOSIS — D631 Anemia in chronic kidney disease: Secondary | ICD-10-CM | POA: Diagnosis not present

## 2021-12-02 DIAGNOSIS — J158 Pneumonia due to other specified bacteria: Secondary | ICD-10-CM | POA: Diagnosis not present

## 2021-12-02 DIAGNOSIS — D509 Iron deficiency anemia, unspecified: Secondary | ICD-10-CM | POA: Diagnosis not present

## 2021-12-04 ENCOUNTER — Telehealth: Payer: Self-pay | Admitting: Internal Medicine

## 2021-12-04 DIAGNOSIS — C61 Malignant neoplasm of prostate: Secondary | ICD-10-CM | POA: Diagnosis not present

## 2021-12-04 DIAGNOSIS — Z5111 Encounter for antineoplastic chemotherapy: Secondary | ICD-10-CM | POA: Diagnosis not present

## 2021-12-04 DIAGNOSIS — D509 Iron deficiency anemia, unspecified: Secondary | ICD-10-CM | POA: Diagnosis not present

## 2021-12-04 DIAGNOSIS — I12 Hypertensive chronic kidney disease with 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: Secondary | ICD-10-CM | POA: Diagnosis not present

## 2021-12-04 DIAGNOSIS — N186 End stage renal disease: Secondary | ICD-10-CM | POA: Diagnosis not present

## 2021-12-04 DIAGNOSIS — Z23 Encounter for immunization: Secondary | ICD-10-CM | POA: Diagnosis not present

## 2021-12-04 DIAGNOSIS — D631 Anemia in chronic kidney disease: Secondary | ICD-10-CM | POA: Diagnosis not present

## 2021-12-04 DIAGNOSIS — N2581 Secondary hyperparathyroidism of renal origin: Secondary | ICD-10-CM | POA: Diagnosis not present

## 2021-12-04 DIAGNOSIS — Z992 Dependence on renal dialysis: Secondary | ICD-10-CM | POA: Diagnosis not present

## 2021-12-04 DIAGNOSIS — J158 Pneumonia due to other specified bacteria: Secondary | ICD-10-CM | POA: Diagnosis not present

## 2021-12-04 NOTE — Telephone Encounter (Signed)
Patient called to let Dr. Gasper Sells and nurse know that they would like to send there medication over to [facility]. Please call back to verify

## 2021-12-04 NOTE — Telephone Encounter (Signed)
Left a message for the pt to call back.  

## 2021-12-05 ENCOUNTER — Other Ambulatory Visit: Payer: Self-pay

## 2021-12-05 ENCOUNTER — Telehealth: Payer: Self-pay | Admitting: Internal Medicine

## 2021-12-05 DIAGNOSIS — H25013 Cortical age-related cataract, bilateral: Secondary | ICD-10-CM | POA: Diagnosis not present

## 2021-12-05 DIAGNOSIS — H35372 Puckering of macula, left eye: Secondary | ICD-10-CM | POA: Diagnosis not present

## 2021-12-05 DIAGNOSIS — H35363 Drusen (degenerative) of macula, bilateral: Secondary | ICD-10-CM | POA: Diagnosis not present

## 2021-12-05 DIAGNOSIS — H35341 Macular cyst, hole, or pseudohole, right eye: Secondary | ICD-10-CM | POA: Diagnosis not present

## 2021-12-05 DIAGNOSIS — H2513 Age-related nuclear cataract, bilateral: Secondary | ICD-10-CM | POA: Diagnosis not present

## 2021-12-05 DIAGNOSIS — H40013 Open angle with borderline findings, low risk, bilateral: Secondary | ICD-10-CM | POA: Diagnosis not present

## 2021-12-05 DIAGNOSIS — H35033 Hypertensive retinopathy, bilateral: Secondary | ICD-10-CM | POA: Diagnosis not present

## 2021-12-05 MED ORDER — HYDRALAZINE HCL 50 MG PO TABS: 50.0000 mg | ORAL_TABLET | Freq: Two times a day (BID) | ORAL | 3 refills | Status: DC

## 2021-12-05 MED ORDER — METOPROLOL TARTRATE 100 MG PO TABS: 100.0000 mg | ORAL_TABLET | Freq: Two times a day (BID) | ORAL | 3 refills | Status: DC

## 2021-12-05 MED ORDER — CLOPIDOGREL BISULFATE 75 MG PO TABS: 75.0000 mg | ORAL_TABLET | Freq: Every day | ORAL | 3 refills | Status: DC

## 2021-12-05 MED ORDER — ISOSORBIDE DINITRATE 20 MG PO TABS: 20.0000 mg | ORAL_TABLET | Freq: Two times a day (BID) | ORAL | 3 refills | Status: AC

## 2021-12-05 MED ORDER — ROSUVASTATIN CALCIUM 10 MG PO TABS: 10.0000 mg | ORAL_TABLET | Freq: Every day | ORAL | 3 refills | Status: DC

## 2021-12-05 MED ORDER — AMLODIPINE BESYLATE 10 MG PO TABS: 10.0000 mg | ORAL_TABLET | Freq: Every day | ORAL | 3 refills | Status: AC

## 2021-12-05 NOTE — Telephone Encounter (Signed)
Received [facility].   They did not give names of medications that were needing to be refilled. Attempted to return call, no answer, LVM requesting returned call.   Talbot Grumbling, RN

## 2021-12-05 NOTE — Telephone Encounter (Signed)
Patient medication were sent to patient pharmacy as requested. Conformation received.

## 2021-12-05 NOTE — Telephone Encounter (Signed)
*  STAT* If patient is at the pharmacy, call can be transferred to refill team.   1. Which medications need to be refilled? (please list name of each medication and dose if known)  isosorbide dinitrate (ISORDIL) 20 MG tablet  metoprolol tartrate (LOPRESSOR) 100 MG tablet  amLODipine (NORVASC) 10 MG tablet  hydrALAZINE (APRESOLINE) 25 MG tablet  rosuvastatin (CRESTOR) 10 MG tablet  clopidogrel (PLAVIX) 75 MG tablet    2. Which pharmacy/location (including street and city if local pharmacy) is medication to be sent to?[place] location [phone]  3. Do they need a 30 day or 90 day supply? 90 day

## 2021-12-06 DIAGNOSIS — J158 Pneumonia due to other specified bacteria: Secondary | ICD-10-CM | POA: Diagnosis not present

## 2021-12-06 DIAGNOSIS — Z992 Dependence on renal dialysis: Secondary | ICD-10-CM | POA: Diagnosis not present

## 2021-12-06 DIAGNOSIS — D509 Iron deficiency anemia, unspecified: Secondary | ICD-10-CM | POA: Diagnosis not present

## 2021-12-06 DIAGNOSIS — D631 Anemia in chronic kidney disease: Secondary | ICD-10-CM | POA: Diagnosis not present

## 2021-12-06 DIAGNOSIS — N186 End stage renal disease: Secondary | ICD-10-CM | POA: Diagnosis not present

## 2021-12-06 DIAGNOSIS — N2581 Secondary hyperparathyroidism of renal origin: Secondary | ICD-10-CM | POA: Diagnosis not present

## 2021-12-09 DIAGNOSIS — Z992 Dependence on renal dialysis: Secondary | ICD-10-CM | POA: Diagnosis not present

## 2021-12-09 DIAGNOSIS — N2581 Secondary hyperparathyroidism of renal origin: Secondary | ICD-10-CM | POA: Diagnosis not present

## 2021-12-09 DIAGNOSIS — D509 Iron deficiency anemia, unspecified: Secondary | ICD-10-CM | POA: Diagnosis not present

## 2021-12-09 DIAGNOSIS — D631 Anemia in chronic kidney disease: Secondary | ICD-10-CM | POA: Diagnosis not present

## 2021-12-09 DIAGNOSIS — N186 End stage renal disease: Secondary | ICD-10-CM | POA: Diagnosis not present

## 2021-12-09 DIAGNOSIS — J158 Pneumonia due to other specified bacteria: Secondary | ICD-10-CM | POA: Diagnosis not present

## 2021-12-11 DIAGNOSIS — D631 Anemia in chronic kidney disease: Secondary | ICD-10-CM | POA: Diagnosis not present

## 2021-12-11 DIAGNOSIS — D509 Iron deficiency anemia, unspecified: Secondary | ICD-10-CM | POA: Diagnosis not present

## 2021-12-11 DIAGNOSIS — Z992 Dependence on renal dialysis: Secondary | ICD-10-CM | POA: Diagnosis not present

## 2021-12-11 DIAGNOSIS — J158 Pneumonia due to other specified bacteria: Secondary | ICD-10-CM | POA: Diagnosis not present

## 2021-12-11 DIAGNOSIS — N2581 Secondary hyperparathyroidism of renal origin: Secondary | ICD-10-CM | POA: Diagnosis not present

## 2021-12-11 DIAGNOSIS — N186 End stage renal disease: Secondary | ICD-10-CM | POA: Diagnosis not present

## 2021-12-11 NOTE — Telephone Encounter (Signed)
Medications already sent to [facility].

## 2021-12-13 DIAGNOSIS — D509 Iron deficiency anemia, unspecified: Secondary | ICD-10-CM | POA: Diagnosis not present

## 2021-12-13 DIAGNOSIS — N2581 Secondary hyperparathyroidism of renal origin: Secondary | ICD-10-CM | POA: Diagnosis not present

## 2021-12-13 DIAGNOSIS — N186 End stage renal disease: Secondary | ICD-10-CM | POA: Diagnosis not present

## 2021-12-13 DIAGNOSIS — D631 Anemia in chronic kidney disease: Secondary | ICD-10-CM | POA: Diagnosis not present

## 2021-12-13 DIAGNOSIS — Z992 Dependence on renal dialysis: Secondary | ICD-10-CM | POA: Diagnosis not present

## 2021-12-13 DIAGNOSIS — J158 Pneumonia due to other specified bacteria: Secondary | ICD-10-CM | POA: Diagnosis not present

## 2021-12-16 DIAGNOSIS — D631 Anemia in chronic kidney disease: Secondary | ICD-10-CM | POA: Diagnosis not present

## 2021-12-16 DIAGNOSIS — J158 Pneumonia due to other specified bacteria: Secondary | ICD-10-CM | POA: Diagnosis not present

## 2021-12-16 DIAGNOSIS — D509 Iron deficiency anemia, unspecified: Secondary | ICD-10-CM | POA: Diagnosis not present

## 2021-12-16 DIAGNOSIS — Z992 Dependence on renal dialysis: Secondary | ICD-10-CM | POA: Diagnosis not present

## 2021-12-16 DIAGNOSIS — N186 End stage renal disease: Secondary | ICD-10-CM | POA: Diagnosis not present

## 2021-12-16 DIAGNOSIS — N2581 Secondary hyperparathyroidism of renal origin: Secondary | ICD-10-CM | POA: Diagnosis not present

## 2021-12-17 ENCOUNTER — Ambulatory Visit: Payer: Self-pay

## 2021-12-18 DIAGNOSIS — D509 Iron deficiency anemia, unspecified: Secondary | ICD-10-CM | POA: Diagnosis not present

## 2021-12-18 DIAGNOSIS — J158 Pneumonia due to other specified bacteria: Secondary | ICD-10-CM | POA: Diagnosis not present

## 2021-12-18 DIAGNOSIS — D631 Anemia in chronic kidney disease: Secondary | ICD-10-CM | POA: Diagnosis not present

## 2021-12-18 DIAGNOSIS — N186 End stage renal disease: Secondary | ICD-10-CM | POA: Diagnosis not present

## 2021-12-18 DIAGNOSIS — Z992 Dependence on renal dialysis: Secondary | ICD-10-CM | POA: Diagnosis not present

## 2021-12-18 DIAGNOSIS — N2581 Secondary hyperparathyroidism of renal origin: Secondary | ICD-10-CM | POA: Diagnosis not present

## 2021-12-18 NOTE — Patient Instructions (Signed)
Visit Information  Thank you for taking time to visit with me today. Please don't hesitate to contact me if I can be of assistance to you.   Following are the goals we discussed today:   Goals Addressed             This Visit's Progress    I was just out of the hospital for stroke       Care Coordination Interventions: Evaluation of current treatment plan related to patient's adherence to plan as established by provider Reviewed Importance of taking all medications as prescribed Assessed for signs and symptoms of stroke Assessed for management of bladder and/or bowel incontinence Active listening / Reflection utilized  Emotional Support Provided Problem [place] strategies reviewed According to [name], he is currently doing well and undergoing dialysis. He has not experienced any chest pain or swelling lately. Although his blood pressure still fluctuates, it has not risen to dangerous levels. He has requested me to call and check on him, but he would prefer if it was done every two months.        Our next appointment is by telephone on 1/16 at 2 pm  Please call the care guide team at [phone] if you need to cancel or reschedule your appointment.   If you are experiencing a Mental Health or [place] or need someone to talk to, please call 1-800-273-TALK (toll free, 24 hour hotline)  Patient verbalizes understanding of instructions and care plan provided today and agrees to view in [place]. Active MyChart status and patient understanding of how to access instructions and care plan via MyChart confirmed with patient.     Lazaro Arms RN, BSN, [place] [facility]   Phone: [phone]

## 2021-12-18 NOTE — Patient Outreach (Signed)
  Care Coordination   Follow Up Visit Note   12/17/2021 Name: James Wall MRN: [id number] DOB: [birth year]  James Wall is a 56 y.o. year old male who sees Wells Guiles, DO for primary care. I spoke with  [name] by phone today.  What matters to the patients health and wellness today?  I am felling ok and going to dialysis.    Goals Addressed             This Visit's Progress    I was just out of the hospital for stroke       Care Coordination Interventions: Evaluation of current treatment plan related to patient's adherence to plan as established by provider Reviewed Importance of taking all medications as prescribed Assessed for signs and symptoms of stroke Assessed for management of bladder and/or bowel incontinence Active listening / Reflection utilized  Emotional Support Provided Problem [place] strategies reviewed According to [name], he is currently doing well and undergoing dialysis. He has not experienced any chest pain or swelling lately. Although his blood pressure still fluctuates, it has not risen to dangerous levels. He has requested me to call and check on him, but he would prefer if it was done every two months.        SDOH assessments and interventions completed:  No     Care Coordination Interventions Activated:  Yes  Care Coordination Interventions:  Yes, provided   Follow up plan: Follow up call scheduled for 1/16  2 pm    Encounter Outcome:  Pt. Visit Completed   Lazaro Arms RN, BSN, [place] [facility]   Phone: [phone]

## 2021-12-20 DIAGNOSIS — D509 Iron deficiency anemia, unspecified: Secondary | ICD-10-CM | POA: Diagnosis not present

## 2021-12-20 DIAGNOSIS — J158 Pneumonia due to other specified bacteria: Secondary | ICD-10-CM | POA: Diagnosis not present

## 2021-12-20 DIAGNOSIS — N186 End stage renal disease: Secondary | ICD-10-CM | POA: Diagnosis not present

## 2021-12-20 DIAGNOSIS — N2581 Secondary hyperparathyroidism of renal origin: Secondary | ICD-10-CM | POA: Diagnosis not present

## 2021-12-20 DIAGNOSIS — Z992 Dependence on renal dialysis: Secondary | ICD-10-CM | POA: Diagnosis not present

## 2021-12-20 DIAGNOSIS — D631 Anemia in chronic kidney disease: Secondary | ICD-10-CM | POA: Diagnosis not present

## 2021-12-24 ENCOUNTER — Encounter (INDEPENDENT_AMBULATORY_CARE_PROVIDER_SITE_OTHER): Payer: Medicare Other | Admitting: Ophthalmology

## 2021-12-24 DIAGNOSIS — D631 Anemia in chronic kidney disease: Secondary | ICD-10-CM | POA: Diagnosis not present

## 2021-12-24 DIAGNOSIS — N186 End stage renal disease: Secondary | ICD-10-CM | POA: Diagnosis not present

## 2021-12-24 DIAGNOSIS — N2581 Secondary hyperparathyroidism of renal origin: Secondary | ICD-10-CM | POA: Diagnosis not present

## 2021-12-24 DIAGNOSIS — Z992 Dependence on renal dialysis: Secondary | ICD-10-CM | POA: Diagnosis not present

## 2021-12-24 DIAGNOSIS — D509 Iron deficiency anemia, unspecified: Secondary | ICD-10-CM | POA: Diagnosis not present

## 2021-12-24 DIAGNOSIS — J158 Pneumonia due to other specified bacteria: Secondary | ICD-10-CM | POA: Diagnosis not present

## 2021-12-27 DIAGNOSIS — N2581 Secondary hyperparathyroidism of renal origin: Secondary | ICD-10-CM | POA: Diagnosis not present

## 2021-12-27 DIAGNOSIS — D509 Iron deficiency anemia, unspecified: Secondary | ICD-10-CM | POA: Diagnosis not present

## 2021-12-27 DIAGNOSIS — Z992 Dependence on renal dialysis: Secondary | ICD-10-CM | POA: Diagnosis not present

## 2021-12-27 DIAGNOSIS — D631 Anemia in chronic kidney disease: Secondary | ICD-10-CM | POA: Diagnosis not present

## 2021-12-27 DIAGNOSIS — N186 End stage renal disease: Secondary | ICD-10-CM | POA: Diagnosis not present

## 2021-12-27 DIAGNOSIS — J158 Pneumonia due to other specified bacteria: Secondary | ICD-10-CM | POA: Diagnosis not present

## 2021-12-30 DIAGNOSIS — J158 Pneumonia due to other specified bacteria: Secondary | ICD-10-CM | POA: Diagnosis not present

## 2021-12-30 DIAGNOSIS — N2581 Secondary hyperparathyroidism of renal origin: Secondary | ICD-10-CM | POA: Diagnosis not present

## 2021-12-30 DIAGNOSIS — N186 End stage renal disease: Secondary | ICD-10-CM | POA: Diagnosis not present

## 2021-12-30 DIAGNOSIS — D509 Iron deficiency anemia, unspecified: Secondary | ICD-10-CM | POA: Diagnosis not present

## 2021-12-30 DIAGNOSIS — D631 Anemia in chronic kidney disease: Secondary | ICD-10-CM | POA: Diagnosis not present

## 2021-12-30 DIAGNOSIS — Z992 Dependence on renal dialysis: Secondary | ICD-10-CM | POA: Diagnosis not present

## 2022-01-01 DIAGNOSIS — N2581 Secondary hyperparathyroidism of renal origin: Secondary | ICD-10-CM | POA: Diagnosis not present

## 2022-01-01 DIAGNOSIS — N186 End stage renal disease: Secondary | ICD-10-CM | POA: Diagnosis not present

## 2022-01-01 DIAGNOSIS — Z992 Dependence on renal dialysis: Secondary | ICD-10-CM | POA: Diagnosis not present

## 2022-01-01 DIAGNOSIS — D631 Anemia in chronic kidney disease: Secondary | ICD-10-CM | POA: Diagnosis not present

## 2022-01-01 DIAGNOSIS — D509 Iron deficiency anemia, unspecified: Secondary | ICD-10-CM | POA: Diagnosis not present

## 2022-01-01 DIAGNOSIS — J158 Pneumonia due to other specified bacteria: Secondary | ICD-10-CM | POA: Diagnosis not present

## 2022-01-02 ENCOUNTER — Encounter: Payer: Self-pay | Admitting: *Deleted

## 2022-01-03 DIAGNOSIS — N2581 Secondary hyperparathyroidism of renal origin: Secondary | ICD-10-CM | POA: Diagnosis not present

## 2022-01-03 DIAGNOSIS — J158 Pneumonia due to other specified bacteria: Secondary | ICD-10-CM | POA: Diagnosis not present

## 2022-01-03 DIAGNOSIS — D509 Iron deficiency anemia, unspecified: Secondary | ICD-10-CM | POA: Diagnosis not present

## 2022-01-03 DIAGNOSIS — D631 Anemia in chronic kidney disease: Secondary | ICD-10-CM | POA: Diagnosis not present

## 2022-01-03 DIAGNOSIS — N186 End stage renal disease: Secondary | ICD-10-CM | POA: Diagnosis not present

## 2022-01-03 DIAGNOSIS — Z23 Encounter for immunization: Secondary | ICD-10-CM | POA: Diagnosis not present

## 2022-01-03 DIAGNOSIS — Z992 Dependence on renal dialysis: Secondary | ICD-10-CM | POA: Diagnosis not present

## 2022-01-03 DIAGNOSIS — I12 Hypertensive chronic kidney disease with 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: Secondary | ICD-10-CM | POA: Diagnosis not present

## 2022-01-06 DIAGNOSIS — N2581 Secondary hyperparathyroidism of renal origin: Secondary | ICD-10-CM | POA: Diagnosis not present

## 2022-01-06 DIAGNOSIS — N186 End stage renal disease: Secondary | ICD-10-CM | POA: Diagnosis not present

## 2022-01-06 DIAGNOSIS — J158 Pneumonia due to other specified bacteria: Secondary | ICD-10-CM | POA: Diagnosis not present

## 2022-01-06 DIAGNOSIS — D509 Iron deficiency anemia, unspecified: Secondary | ICD-10-CM | POA: Diagnosis not present

## 2022-01-06 DIAGNOSIS — Z992 Dependence on renal dialysis: Secondary | ICD-10-CM | POA: Diagnosis not present

## 2022-01-06 DIAGNOSIS — D631 Anemia in chronic kidney disease: Secondary | ICD-10-CM | POA: Diagnosis not present

## 2022-01-08 DIAGNOSIS — D631 Anemia in chronic kidney disease: Secondary | ICD-10-CM | POA: Diagnosis not present

## 2022-01-08 DIAGNOSIS — Z992 Dependence on renal dialysis: Secondary | ICD-10-CM | POA: Diagnosis not present

## 2022-01-08 DIAGNOSIS — D509 Iron deficiency anemia, unspecified: Secondary | ICD-10-CM | POA: Diagnosis not present

## 2022-01-08 DIAGNOSIS — N186 End stage renal disease: Secondary | ICD-10-CM | POA: Diagnosis not present

## 2022-01-08 DIAGNOSIS — J158 Pneumonia due to other specified bacteria: Secondary | ICD-10-CM | POA: Diagnosis not present

## 2022-01-08 DIAGNOSIS — N2581 Secondary hyperparathyroidism of renal origin: Secondary | ICD-10-CM | POA: Diagnosis not present

## 2022-01-09 ENCOUNTER — Other Ambulatory Visit: Payer: Self-pay | Admitting: Student

## 2022-01-10 DIAGNOSIS — D509 Iron deficiency anemia, unspecified: Secondary | ICD-10-CM | POA: Diagnosis not present

## 2022-01-10 DIAGNOSIS — J158 Pneumonia due to other specified bacteria: Secondary | ICD-10-CM | POA: Diagnosis not present

## 2022-01-10 DIAGNOSIS — Z992 Dependence on renal dialysis: Secondary | ICD-10-CM | POA: Diagnosis not present

## 2022-01-10 DIAGNOSIS — N2581 Secondary hyperparathyroidism of renal origin: Secondary | ICD-10-CM | POA: Diagnosis not present

## 2022-01-10 DIAGNOSIS — D631 Anemia in chronic kidney disease: Secondary | ICD-10-CM | POA: Diagnosis not present

## 2022-01-10 DIAGNOSIS — N186 End stage renal disease: Secondary | ICD-10-CM | POA: Diagnosis not present

## 2022-01-13 DIAGNOSIS — D631 Anemia in chronic kidney disease: Secondary | ICD-10-CM | POA: Diagnosis not present

## 2022-01-13 DIAGNOSIS — D509 Iron deficiency anemia, unspecified: Secondary | ICD-10-CM | POA: Diagnosis not present

## 2022-01-13 DIAGNOSIS — N2581 Secondary hyperparathyroidism of renal origin: Secondary | ICD-10-CM | POA: Diagnosis not present

## 2022-01-13 DIAGNOSIS — Z992 Dependence on renal dialysis: Secondary | ICD-10-CM | POA: Diagnosis not present

## 2022-01-13 DIAGNOSIS — J158 Pneumonia due to other specified bacteria: Secondary | ICD-10-CM | POA: Diagnosis not present

## 2022-01-13 DIAGNOSIS — N186 End stage renal disease: Secondary | ICD-10-CM | POA: Diagnosis not present

## 2022-01-14 ENCOUNTER — Inpatient Hospital Stay: Payer: Medicare Other | Attending: Nurse Practitioner | Admitting: *Deleted

## 2022-01-14 DIAGNOSIS — C61 Malignant neoplasm of prostate: Secondary | ICD-10-CM

## 2022-01-14 NOTE — Progress Notes (Signed)
2 identifiers used for verification purposes only. No vitals were taken as this was a telephone visit. Pt denies pain today. Pt says fatigue has gotten better. He is still bothered by hotflashes from ADT, but tolerating them. 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 He averages 3-4 hours per night. Pt does have a history of sleep apnea but has not worn CPAP for at least 6 years, he says. Pt also says that bladder spasms has been reduced since after radiation. Pt has been a dialysis pt for 15 years. He makes very little urine. Pt stated that since he's been doing the hormone therapy, both legs tend to ache and get stiff. He does use a cane outside of the home because of times when he feels the stiffness in both legs makes him unsteady at times. Pt does not take flu vaccine but is interested in getting another Covid vaccine and the Shringrix. Pt does smoke cigarettes 2 packs a week and drinks alcohol occasionally. Pt did say he desires to quit smoking but not right now and rather do it without taking med to help him. Pt was very talkative and expressed his thankfulness for still being here. Pt does not get exercise on a regular basis. I did suggest chair exercises. We discussed the "Nutrition Rainbow"  and food choices that may be helpful. He did say that when he has dialysis, appetite is not as strong but he tries to get in as many vegetables as he can. Pt is up to date with colonoscopy, next due 2027. SCP reviewed and completed.

## 2022-01-15 DIAGNOSIS — N186 End stage renal disease: Secondary | ICD-10-CM | POA: Diagnosis not present

## 2022-01-15 DIAGNOSIS — Z992 Dependence on renal dialysis: Secondary | ICD-10-CM | POA: Diagnosis not present

## 2022-01-15 DIAGNOSIS — J158 Pneumonia due to other specified bacteria: Secondary | ICD-10-CM | POA: Diagnosis not present

## 2022-01-15 DIAGNOSIS — D509 Iron deficiency anemia, unspecified: Secondary | ICD-10-CM | POA: Diagnosis not present

## 2022-01-15 DIAGNOSIS — D631 Anemia in chronic kidney disease: Secondary | ICD-10-CM | POA: Diagnosis not present

## 2022-01-15 DIAGNOSIS — N2581 Secondary hyperparathyroidism of renal origin: Secondary | ICD-10-CM | POA: Diagnosis not present

## 2022-01-17 DIAGNOSIS — Z992 Dependence on renal dialysis: Secondary | ICD-10-CM | POA: Diagnosis not present

## 2022-01-17 DIAGNOSIS — N2581 Secondary hyperparathyroidism of renal origin: Secondary | ICD-10-CM | POA: Diagnosis not present

## 2022-01-17 DIAGNOSIS — J158 Pneumonia due to other specified bacteria: Secondary | ICD-10-CM | POA: Diagnosis not present

## 2022-01-17 DIAGNOSIS — D631 Anemia in chronic kidney disease: Secondary | ICD-10-CM | POA: Diagnosis not present

## 2022-01-17 DIAGNOSIS — N186 End stage renal disease: Secondary | ICD-10-CM | POA: Diagnosis not present

## 2022-01-17 DIAGNOSIS — D509 Iron deficiency anemia, unspecified: Secondary | ICD-10-CM | POA: Diagnosis not present

## 2022-01-20 DIAGNOSIS — D509 Iron deficiency anemia, unspecified: Secondary | ICD-10-CM | POA: Diagnosis not present

## 2022-01-20 DIAGNOSIS — D631 Anemia in chronic kidney disease: Secondary | ICD-10-CM | POA: Diagnosis not present

## 2022-01-20 DIAGNOSIS — J158 Pneumonia due to other specified bacteria: Secondary | ICD-10-CM | POA: Diagnosis not present

## 2022-01-20 DIAGNOSIS — Z992 Dependence on renal dialysis: Secondary | ICD-10-CM | POA: Diagnosis not present

## 2022-01-20 DIAGNOSIS — N2581 Secondary hyperparathyroidism of renal origin: Secondary | ICD-10-CM | POA: Diagnosis not present

## 2022-01-20 DIAGNOSIS — N186 End stage renal disease: Secondary | ICD-10-CM | POA: Diagnosis not present

## 2022-01-22 DIAGNOSIS — D631 Anemia in chronic kidney disease: Secondary | ICD-10-CM | POA: Diagnosis not present

## 2022-01-22 DIAGNOSIS — N186 End stage renal disease: Secondary | ICD-10-CM | POA: Diagnosis not present

## 2022-01-22 DIAGNOSIS — D509 Iron deficiency anemia, unspecified: Secondary | ICD-10-CM | POA: Diagnosis not present

## 2022-01-22 DIAGNOSIS — J158 Pneumonia due to other specified bacteria: Secondary | ICD-10-CM | POA: Diagnosis not present

## 2022-01-22 DIAGNOSIS — Z992 Dependence on renal dialysis: Secondary | ICD-10-CM | POA: Diagnosis not present

## 2022-01-22 DIAGNOSIS — N2581 Secondary hyperparathyroidism of renal origin: Secondary | ICD-10-CM | POA: Diagnosis not present

## 2022-01-24 DIAGNOSIS — J158 Pneumonia due to other specified bacteria: Secondary | ICD-10-CM | POA: Diagnosis not present

## 2022-01-24 DIAGNOSIS — Z992 Dependence on renal dialysis: Secondary | ICD-10-CM | POA: Diagnosis not present

## 2022-01-24 DIAGNOSIS — D509 Iron deficiency anemia, unspecified: Secondary | ICD-10-CM | POA: Diagnosis not present

## 2022-01-24 DIAGNOSIS — N186 End stage renal disease: Secondary | ICD-10-CM | POA: Diagnosis not present

## 2022-01-24 DIAGNOSIS — D631 Anemia in chronic kidney disease: Secondary | ICD-10-CM | POA: Diagnosis not present

## 2022-01-24 DIAGNOSIS — N2581 Secondary hyperparathyroidism of renal origin: Secondary | ICD-10-CM | POA: Diagnosis not present

## 2022-01-29 DIAGNOSIS — D509 Iron deficiency anemia, unspecified: Secondary | ICD-10-CM | POA: Diagnosis not present

## 2022-01-29 DIAGNOSIS — N2581 Secondary hyperparathyroidism of renal origin: Secondary | ICD-10-CM | POA: Diagnosis not present

## 2022-01-29 DIAGNOSIS — Z992 Dependence on renal dialysis: Secondary | ICD-10-CM | POA: Diagnosis not present

## 2022-01-29 DIAGNOSIS — J158 Pneumonia due to other specified bacteria: Secondary | ICD-10-CM | POA: Diagnosis not present

## 2022-01-29 DIAGNOSIS — N186 End stage renal disease: Secondary | ICD-10-CM | POA: Diagnosis not present

## 2022-01-29 DIAGNOSIS — D631 Anemia in chronic kidney disease: Secondary | ICD-10-CM | POA: Diagnosis not present

## 2022-01-30 ENCOUNTER — Telehealth: Payer: Self-pay | Admitting: Internal Medicine

## 2022-01-30 MED ORDER — CLONIDINE 0.3 MG/24HR TD PTWK: 0.3000 mg | MEDICATED_PATCH | TRANSDERMAL | 3 refills | Status: DC

## 2022-01-30 NOTE — Telephone Encounter (Signed)
*  STAT* If patient is at the pharmacy, call can be transferred to refill team.   1. Which medications need to be refilled? (please list name of each medication and dose if known)   isosorbide dinitrate (ISORDIL) 20 MG tablet  clopidogrel (PLAVIX) 75 MG tablet  rosuvastatin (CRESTOR) 10 MG tablet  hydrALAZINE (APRESOLINE) 50 MG tablet  amLODipine (NORVASC) 10 MG tablet  metoprolol tartrate (LOPRESSOR) 100 MG tablet  cloNIDine (CATAPRES - DOSED IN MG/24 HR) 0.3 mg/24hr patch   2. Which pharmacy/location (including street and city if local pharmacy) is medication to be sent to?  [facility] [place] [address]   3. Do they need a 30 day or 90 day supply?  90 day  Caller did not know if the patient still has some of these medications left.

## 2022-01-30 NOTE — Telephone Encounter (Signed)
Pt's medications were sent to pt's pharmacy as requested. Confirmation received.  

## 2022-01-31 DIAGNOSIS — J158 Pneumonia due to other specified bacteria: Secondary | ICD-10-CM | POA: Diagnosis not present

## 2022-01-31 DIAGNOSIS — D509 Iron deficiency anemia, unspecified: Secondary | ICD-10-CM | POA: Diagnosis not present

## 2022-01-31 DIAGNOSIS — N2581 Secondary hyperparathyroidism of renal origin: Secondary | ICD-10-CM | POA: Diagnosis not present

## 2022-01-31 DIAGNOSIS — N186 End stage renal disease: Secondary | ICD-10-CM | POA: Diagnosis not present

## 2022-01-31 DIAGNOSIS — Z992 Dependence on renal dialysis: Secondary | ICD-10-CM | POA: Diagnosis not present

## 2022-01-31 DIAGNOSIS — D631 Anemia in chronic kidney disease: Secondary | ICD-10-CM | POA: Diagnosis not present

## 2022-02-18 ENCOUNTER — Ambulatory Visit: Payer: Self-pay

## 2022-02-18 NOTE — Patient Outreach (Signed)
  Care Coordination   Follow Up Visit Note   02/18/2022 Name: James Wall MRN: [id number] DOB: November 01, 1965  James Wall is a 57 y.o. year old male who sees James Guiles, DO for primary care. I spoke with  James Wall by phone today.  What matters to the patients health and wellness today?  I am doing fair   Goals Addressed             This Visit's Progress    I was just out of the hospital for stroke       Care Coordination Interventions: Evaluation of current treatment plan related to patient's adherence to plan as established by provider Reviewed Importance of taking all medications as prescribed Assessed for signs and symptoms of stroke Assessed for management of bladder and/or bowel incontinence Active listening / Reflection utilized  Emotional Support Provided Problem [place] strategies reviewed James Wall reported that he is doing reasonably well. He has not experienced any chest pain, shortness of breath, or swelling. He is adhering to his medication schedule and maintaining a healthy diet. However, he is having trouble falling asleep and only manages to sleep for 3 to 4 hours. He mentioned discussing this issue with his physician. I informed him that he could contact me if he needed any assistance before our next call.        SDOH assessments and interventions completed:  No     Care Coordination Interventions:  Yes, provided   Follow up plan: Follow up call scheduled for 05/13/22 2 pm    Encounter Outcome:  Pt. Visit Completed   James Arms RN, BSN, [place] [facility]   Phone: [phone]

## 2022-02-18 NOTE — Patient Instructions (Signed)
Visit Information  Thank you for taking time to visit with me today. Please don't hesitate to contact me if I can be of assistance to you.   Following are the goals we discussed today:   Goals Addressed             This Visit's Progress    I was just out of the hospital for stroke       Care Coordination Interventions: Evaluation of current treatment plan related to patient's adherence to plan as established by provider Reviewed Importance of taking all medications as prescribed Assessed for signs and symptoms of stroke Assessed for management of bladder and/or bowel incontinence Active listening / Reflection utilized  Emotional Support Provided Problem [name] strategies reviewed James Wall reported that he is doing reasonably well. He has not experienced any chest pain, shortness of breath, or swelling. He is adhering to his medication schedule and maintaining a healthy diet. However, he is having trouble falling asleep and only manages to sleep for 3 to 4 hours. He mentioned discussing this issue with his physician. I informed him that he could contact me if he needed any assistance before our next call.        Our next appointment is by telephone on 05/13/22 at 2 pm  Please call the care guide team at [phone] if you need to cancel or reschedule your appointment.   If you are experiencing a Mental Health or [place] or need someone to talk to, please call 1-800-273-TALK (toll free, 24 hour hotline)  Patient verbalizes understanding of instructions and care plan provided today and agrees to view in [place]. Active MyChart status and patient understanding of how to access instructions and care plan via MyChart confirmed with patient.     James Arms RN, BSN, [place] [facility]   Phone: [phone]

## 2022-04-09 ENCOUNTER — Telehealth: Payer: Self-pay

## 2022-04-09 NOTE — Telephone Encounter (Signed)
RN returned call to Mr. James Wall had concerns about urinary frequency and microhematuria.  Denies fever, dysuria, no blood clots observed, and nausea or vomiting.  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  Explained to James Wall that at this stage radiation wouldn't be a factor causing symptoms.  When asked if he has followed up with his Urologist he said no.  RN advise to do so and he stated he would call today for appointment.  Encouraged James Wall to increase water intake to flush out or prevent and clots in the bladder.

## 2022-04-10 ENCOUNTER — Encounter: Payer: Self-pay | Admitting: Student

## 2022-04-10 ENCOUNTER — Other Ambulatory Visit: Payer: Self-pay | Admitting: Family Medicine

## 2022-04-10 DIAGNOSIS — H35033 Hypertensive retinopathy, bilateral: Secondary | ICD-10-CM | POA: Insufficient documentation

## 2022-04-19 ENCOUNTER — Other Ambulatory Visit: Payer: Self-pay | Admitting: Internal Medicine

## 2022-04-22 ENCOUNTER — Encounter (INDEPENDENT_AMBULATORY_CARE_PROVIDER_SITE_OTHER): Payer: Medicare Other | Admitting: Ophthalmology

## 2022-04-29 LAB — HM DIABETES EYE EXAM

## 2022-05-13 ENCOUNTER — Ambulatory Visit: Payer: Self-pay

## 2022-05-13 NOTE — Patient Outreach (Deleted)
  Care Coordination   05/13/2022 Name: James Wall MRN: [id number] DOB: 07-07-1965   Care Coordination Outreach Attempts:  An unsuccessful telephone outreach was attempted today to offer the patient information about available care coordination services as a benefit of their health plan.   Follow Up Plan:  Additional outreach attempts will be made to offer the patient care coordination information and services.   Encounter Outcome:  No Answer {THN Tip this will not be part of the note when signed-REQUIRED REPORT FIELD DO NOT DELETE (Optional):27901}  Care Coordination Interventions:  No, not indicated  {THN Tip this will not be part of the note when signed-REQUIRED REPORT FIELD DO NOT DELETE (Optional):27901}  [name] RN, BSN, [facility] Care Coordinator [facility]   Phone: [phone]

## 2022-05-13 NOTE — Patient Outreach (Signed)
  Care Coordination   Follow Up Visit Note   05/13/2022 Name: James Wall MRN: [id number] DOB: 1966-01-17  James Wall is a 57 y.o. year old male who sees Shelby Mattocks, DO for primary care. I spoke with  [name] by phone today.  What matters to the patients health and wellness today?  The patient was unable to talk due to being at an appointment.  He asked me to call him back in 30 minutes but when I did he did not answer.     SDOH assessments and interventions completed:  No     Care Coordination Interventions:  No, not indicated   Follow up plan:  I will call the patient again to schedule another appointment    Encounter Outcome:  Pt. Visit Completed   [name] RN, BSN, [facility] Care Coordinator [facility]   Phone: [phone]

## 2022-05-29 ENCOUNTER — Telehealth: Payer: Self-pay

## 2022-05-29 NOTE — Patient Outreach (Signed)
  Care Coordination   05/29/2022 Name: James Wall MRN: [id number] DOB: 01-04-1966   Care Coordination Outreach Attempts:  An unsuccessful telephone outreach was attempted today to offer the patient information about available care coordination services as a benefit of their health plan.   Follow Up Plan:  Additional outreach attempts will be made to offer the patient care coordination information and services.   Encounter Outcome:  No Answer   Care Coordination Interventions:  No, not indicated    [name] RN, BSN, [facility] Care Coordinator [facility]   Phone: [phone]

## 2022-05-30 ENCOUNTER — Other Ambulatory Visit: Payer: Self-pay | Admitting: Urology

## 2022-06-10 ENCOUNTER — Ambulatory Visit: Payer: Self-pay

## 2022-06-10 LAB — HM DIABETES EYE EXAM

## 2022-06-10 NOTE — Patient Outreach (Signed)
  Care Coordination   Follow Up Visit Note   06/10/2022 Name: Dorie Haxton MRN: [id number] DOB: [birth year]  Clayden Laflash Corneau is a 57 y.o. year old male who sees Shelby Mattocks, DO for primary care. I spoke with  [name] by phone today.  What matters to the patients health and wellness today?  Mr. Boody has reported experiencing issues during his dialysis sessions. Despite this, he is grateful for the treatment as it has been helping him stay alive. He expressed his dislike for disrespectful behavior. Mr. Spayd blood pressure has been fluctuating, especially when he gets upset. He has also been noticing blood in his urine, which started as a dark red color but is now getting lighter. He has an upcoming appointment on Jun 26, 2022, for a cystoscopy..    Goals Addressed             This Visit's Progress    I was just out of the hospital for stroke       Patient Goals/Self Care Activities: -Patient/Caregiver will self-administer medications as prescribed as evidenced by self-report/primary caregiver report  -Patient/Caregiver will attend all scheduled provider appointments as evidenced by clinician review of documented attendance to scheduled appointments and patient/caregiver report -Patient/Caregiver will call pharmacy for medication refills as evidenced by patient report and review of pharmacy fill history as appropriate -Patient/Caregiver will call provider office for new concerns or questions as evidenced by review of documented incoming telephone call notes and patient report -Patient/Caregiver verbalizes understanding of plan -Patient/Caregiver will focus on medication adherence by taking medications as prescribed  -Calls provider office for new concerns, questions, or BP outside discussed parameters -Checks BP and records as discussed -Follows a low sodium diet/DASH diet -Follow CHF Action Plan  Evaluation of current treatment plan related to patient's  adherence to plan as established by provider Reviewed Importance of taking all medications as prescribed Assessed for signs and symptoms of stroke Assessed for management of bladder and/or bowel incontinence Active listening / Reflection utilized  Emotional Support Provided Problem [place] /[place] strategies reviewed        SDOH assessments and interventions completed:  No     Care Coordination Interventions:  Yes, provided   Interventions Today    Flowsheet Row Most Recent Value  Chronic Disease   Chronic disease during today's visit Congestive Heart Failure (CHF), Hypertension (HTN)  General Interventions   General Interventions Discussed/Reviewed General Interventions Discussed  Safety Interventions   Safety Discussed/Reviewed Safety Discussed        Follow up plan: Follow up call scheduled for 07/10/22  130 pm    Encounter Outcome:  Pt. Visit Completed   [name] RN, BSN, [facility] Care Coordinator [facility]   Phone: [phone]

## 2022-06-10 NOTE — Patient Instructions (Signed)
Visit Information  Thank you for taking time to visit with me today. Please don't hesitate to contact me if I can be of assistance to you.   Following are the goals we discussed today:   Goals Addressed             This Visit's Progress    I was just out of the hospital for stroke       Patient Goals/Self Care Activities: -Patient/Caregiver will self-administer medications as prescribed as evidenced by self-report/primary caregiver report  -Patient/Caregiver will attend all scheduled provider appointments as evidenced by clinician review of documented attendance to scheduled appointments and patient/caregiver report -Patient/Caregiver will call pharmacy for medication refills as evidenced by patient report and review of pharmacy fill history as appropriate -Patient/Caregiver will call provider office for new concerns or questions as evidenced by review of documented incoming telephone call notes and patient report -Patient/Caregiver verbalizes understanding of plan -Patient/Caregiver will focus on medication adherence by taking medications as prescribed  -Calls provider office for new concerns, questions, or BP outside discussed parameters -Checks BP and records as discussed -Follows a low sodium diet/DASH diet -Follow CHF Action Plan  Evaluation of current treatment plan related to patient's adherence to plan as established by provider Reviewed Importance of taking all medications as prescribed Assessed for signs and symptoms of stroke Assessed for management of bladder and/or bowel incontinence Active listening / Reflection utilized  Emotional Support Provided Problem Solving /Task Center strategies reviewed        Our next appointment is by telephone on 07/10/22 at 130 pm  Please call the care guide team at [phone] if you need to cancel or reschedule your appointment.   If you are experiencing a Mental Health or Behavioral Health Crisis or need someone to talk to, please  call 1-800-273-TALK (toll free, 24 hour hotline)  Patient verbalizes understanding of instructions and care plan provided today and agrees to view in MyChart. Active MyChart status and patient understanding of how to access instructions and care plan via MyChart confirmed with patient.     [name] RN, BSN, [facility] Care Coordinator [facility]   Phone: [phone]

## 2022-06-13 ENCOUNTER — Encounter (HOSPITAL_COMMUNITY): Payer: Self-pay

## 2022-06-13 NOTE — Progress Notes (Addendum)
PCP - Shelby Mattocks, DO LOV 10-22-21 epic Cardiologist - LOV 10-22-21 epic Riley Lam, MD  PPM/ICD -  Device Orders -  Rep Notified -   Chest x-ray - 1v 09-24-21 epic EKG - 09-24-21 Stress Test -  ECHO - 09-15-21 epic Cardiac Cath -   Sleep Study -  CPAP -   Fasting Blood Sugar -  Checks Blood Sugar _____ times a day  Blood Thinner Instructions: Aspirin Instructions:  ERAS Protcol - PRE-SURGERY    COVID vaccine -X4  Activity--Able to complete ADL's without CP has SOB not new for pt. Since on Dialysis 15 years  Anesthesia review: HTN,COPD, OSA, CHF,cirrhosis, pulm.HTN, ESRD, Dialysis pt. M,W,F  Patient denies shortness of breath, fever, cough and chest pain at PAT appointment   All instructions explained to the patient, with a verbal understanding of the material. Patient agrees to go over the instructions while at home for a better understanding. Patient also instructed to self quarantine after being tested for COVID-19. The opportunity to ask questions was provided.

## 2022-06-13 NOTE — Patient Instructions (Addendum)
SURGICAL WAITING ROOM VISITATION  Patients having surgery or a procedure may have no more than 2 support people in the waiting area - these visitors may rotate.    Children under the age of 45 must have an adult with them who is not the patient.  Due to an increase in RSV and influenza rates and associated hospitalizations, children ages 24 and under may not visit patients in [facility] hospitals.  If the patient needs to stay at the hospital during part of their recovery, the visitor guidelines for inpatient rooms apply. Pre-op nurse will coordinate an appropriate time for 1 support person to accompany patient in pre-op.  This support person may not rotate.    Please refer to the [facility] website for the visitor guidelines for Inpatients (after your surgery is over and you are in a regular room).       Your procedure is scheduled on: 06-26-22   Report to [facility] Main Entrance    Report to admitting at     10:15 AM   Call this number if you have problems the morning of surgery [phone]   Do not eat food :After Midnight.   After Midnight you may have the following liquids until _0630_____ AM/ DAY OF SURGERY  THEN NOTHING BY MOUTH  Water Non-Citrus Juices (without pulp, NO RED-Apple, White grape, White cranberry) Black Coffee (NO MILK/CREAM OR CREAMERS, sugar ok)  Clear Tea (NO MILK/CREAM OR CREAMERS, sugar ok) regular and decaf                             Plain Jell-O (NO RED)                                           Fruit ices (not with fruit pulp, NO RED)                                     Popsicles (NO RED)                                                               Sports drinks like Gatorade (NO RED)                           If you have questions, please contact your surgeon's office.   FOLLOW  ANY ADDITIONAL PRE OP INSTRUCTIONS YOU RECEIVED FROM YOUR SURGEON'S OFFICE!!!     Oral Hygiene is also important to reduce your risk of infection.                                      Remember - BRUSH YOUR TEETH THE MORNING OF SURGERY WITH YOUR REGULAR TOOTHPASTE  DENTURES WILL BE REMOVED PRIOR TO SURGERY PLEASE DO NOT APPLY "Poly grip" OR ADHESIVES!!!   Do NOT smoke after Midnight    Take these medicines the morning of surgery with A SIP OF WATER: Isosorbide, Hydralazine, metoprolol,  You may not have any metal on your body including hair pins, jewelry, and body piercing             Do not wear lotions, powders, /cologne, or deodorant               Men may shave face and neck.   Do not bring valuables to the hospital. [place] IS NOT             RESPONSIBLE   FOR VALUABLES.   Contacts, glasses, dentures or bridgework may not be worn into surgery.    DO NOT BRING YOUR HOME MEDICATIONS TO THE HOSPITAL. PHARMACY WILL DISPENSE MEDICATIONS LISTED ON YOUR MEDICATION LIST TO YOU DURING YOUR ADMISSION IN THE HOSPITAL!    Patients discharged on the day of surgery will not be allowed to drive home.  Someone NEEDS to stay with you for the first 24 hours after anesthesia.                 Please read over the following fact sheets you were given: IF YOU HAVE QUESTIONS ABOUT YOUR PRE-OP INSTRUCTIONS PLEASE CALL [phone]   If you test positive for Covid or have been in contact with anyone that has tested positive in the last 10 days please notify you surgeon.    [place] - Preparing for Surgery Before surgery, you can play an important role.  Because skin is not sterile, your skin needs to be as free of germs as possible.  You can reduce the number of germs on your skin by washing with CHG (chlorahexidine gluconate) soap before surgery.  CHG is an antiseptic cleaner which kills germs and bonds with the skin to continue killing germs even after washing. Please DO NOT use if you have an allergy to CHG or antibacterial soaps.  If your skin becomes reddened/irritated stop using the CHG and inform your nurse  when you arrive at Short Stay. Do not shave (including legs and underarms) for at least 48 hours prior to the first CHG shower.  You may shave your face/neck. Please follow these instructions carefully:  1.  Shower with CHG Soap the night before surgery and the  morning of Surgery.  2.  If you choose to wash your hair, wash your hair first as usual with your  normal  shampoo.  3.  After you shampoo, rinse your hair and body thoroughly to remove the  shampoo.                            4.  Use CHG as you would any other liquid soap.  You can apply chg directly  to the skin and wash                       Gently with a scrungie or clean washcloth.  5.  Apply the CHG Soap to your body ONLY FROM THE NECK DOWN.   Do not use on face/ open                           Wound or open sores. Avoid contact with eyes, ears mouth and genitals (private parts).                       Wash face,  Genitals (private parts) with your normal soap.  6.  Wash thoroughly, paying special attention to the area where your surgery  will be performed.  7.  Thoroughly rinse your body with warm water from the neck down.  8.  DO NOT shower/wash with your normal soap after using and rinsing off  the CHG Soap.                9.  Pat yourself dry with a clean towel.            10.  Wear clean pajamas.            11.  Place clean sheets on your bed the night of your first shower and do not  sleep with pets. Day of Surgery : Do not apply any lotions/deodorants the morning of surgery.  Please wear clean clothes to the hospital/surgery center.  FAILURE TO FOLLOW THESE INSTRUCTIONS MAY RESULT IN THE CANCELLATION OF YOUR SURGERY PATIENT SIGNATURE_________________________________  NURSE SIGNATURE__________________________________  ________________________________________________________________________

## 2022-06-17 ENCOUNTER — Encounter (HOSPITAL_COMMUNITY)
Admission: RE | Admit: 2022-06-17 | Discharge: 2022-06-17 | Disposition: A | Discharge status: Home / Self Care | Payer: 59 | Source: Ambulatory Visit | Attending: Urology | Admitting: Urology

## 2022-06-17 ENCOUNTER — Other Ambulatory Visit: Payer: Self-pay

## 2022-06-17 ENCOUNTER — Encounter (HOSPITAL_COMMUNITY): Payer: Self-pay

## 2022-06-17 VITALS — BP 162/99 | HR 83 | Temp 98.4°F | Resp 16 | Ht 65.0 in | Wt 187.0 lb

## 2022-06-17 DIAGNOSIS — I5032 Chronic diastolic (congestive) heart failure: Secondary | ICD-10-CM | POA: Insufficient documentation

## 2022-06-17 DIAGNOSIS — Z01812 Encounter for preprocedural laboratory examination: Secondary | ICD-10-CM | POA: Diagnosis not present

## 2022-06-17 DIAGNOSIS — Z01818 Encounter for other preprocedural examination: Secondary | ICD-10-CM

## 2022-06-17 HISTORY — DX: Cerebral infarction, unspecified: I63.9

## 2022-06-17 HISTORY — DX: Heart failure, unspecified: I50.9

## 2022-06-17 LAB — CBC
HCT: 39 % (ref 39.0–52.0)
Hemoglobin: 12.6 g/dL — ABNORMAL LOW (ref 13.0–17.0)
MCH: 34.7 pg — ABNORMAL HIGH (ref 26.0–34.0)
MCHC: 32.3 g/dL (ref 30.0–36.0)
MCV: 107.4 fL — ABNORMAL HIGH (ref 80.0–100.0)
Platelets: 169 10*3/uL (ref 150–400)
RBC: 3.63 MIL/uL — ABNORMAL LOW (ref 4.22–5.81)
RDW: 12.9 % (ref 11.5–15.5)
WBC: 4.6 10*3/uL (ref 4.0–10.5)
nRBC: 0 % (ref 0.0–0.2)

## 2022-06-17 LAB — SURGICAL PCR SCREEN
MRSA, PCR: NEGATIVE
Staphylococcus aureus: NEGATIVE

## 2022-06-18 ENCOUNTER — Telehealth: Payer: Self-pay

## 2022-06-18 ENCOUNTER — Telehealth: Payer: Self-pay | Admitting: Internal Medicine

## 2022-06-18 NOTE — Telephone Encounter (Signed)
   Pre-operative Risk Assessment    Patient Name: James Wall  DOB: 1966-01-05 MRN: [id number]      Request for Surgical Clearance    Procedure:   Cysto and possible re-section of bladder tumor  Date of Surgery:  Clearance 06/26/22                                 Surgeon:  Dr. Alvester Morin Surgeon's Group or Practice Name:  [facility]  Phone number:  [phone]  Fax number:  [phone]   Type of Clearance Requested:   - Medical    Type of Anesthesia:  General    Additional requests/questions:   Caller stated patient will need medical clearance  Signed, Annetta Maw   06/18/2022, 2:41 PM

## 2022-06-18 NOTE — Telephone Encounter (Signed)
Spoke with patient who is agreeable to do a tele visit on 5/21 at 240 pm. Med rec and consent done.

## 2022-06-18 NOTE — Telephone Encounter (Signed)
Primary Cardiologist:Mahesh A Izora Ribas, MD   Preoperative team, please contact this patient and set up a phone call appointment for further preoperative risk assessment. Please obtain consent and complete medication review. Thank you for your help.   I confirm that guidance regarding antiplatelet and oral anticoagulation therapy has been completed and, if necessary, noted below (none requested).   Levi Aland, NP-C  06/18/2022, 3:09 PM [address], [address] Office [phone] Fax [phone]

## 2022-06-18 NOTE — Telephone Encounter (Signed)
  Patient Consent for Virtual Visit        James Wall has provided verbal consent on 06/18/2022 for a virtual visit (video or telephone).   CONSENT FOR VIRTUAL VISIT FOR:  James Wall  By participating in this virtual visit I agree to the following:  I hereby voluntarily request, consent and authorize [facility] and its employed or contracted physicians, physician assistants, nurse practitioners or other licensed health care professionals (the Practitioner), to provide me with telemedicine health care services (the "Services") as deemed necessary by the treating Practitioner. I acknowledge and consent to receive the Services by the Practitioner via telemedicine. I understand that the telemedicine visit will involve communicating with the Practitioner through live audiovisual communication technology and the disclosure of certain medical information by electronic transmission. I acknowledge that I have been given the opportunity to request an in-person assessment or other available alternative prior to the telemedicine visit and am voluntarily participating in the telemedicine visit.  I understand that I have the right to withhold or withdraw my consent to the use of telemedicine in the course of my care at any time, without affecting my right to future care or treatment, and that the Practitioner or I may terminate the telemedicine visit at any time. I understand that I have the right to inspect all information obtained and/or recorded in the course of the telemedicine visit and may receive copies of available information for a reasonable fee.  I understand that some of the potential risks of receiving the Services via telemedicine include:  Delay or interruption in medical evaluation due to technological equipment failure or disruption; Information transmitted may not be sufficient (e.g. poor resolution of images) to allow for appropriate medical decision making by the  Practitioner; and/or  In rare instances, security protocols could fail, causing a breach of personal health information.  Furthermore, I acknowledge that it is my responsibility to provide information about my medical history, conditions and care that is complete and accurate to the best of my ability. I acknowledge that Practitioner's advice, recommendations, and/or decision may be based on factors not within their control, such as incomplete or inaccurate data provided by me or distortions of diagnostic images or specimens that may result from electronic transmissions. I understand that the practice of medicine is not an exact science and that Practitioner makes no warranties or guarantees regarding treatment outcomes. I acknowledge that a copy of this consent can be made available to me via my patient portal [facility] MyChart), or I can request a printed copy by calling the office of [facility].    I understand that my insurance will be billed for this visit.   I have read or had this consent read to me. I understand the contents of this consent, which adequately explains the benefits and risks of the Services being provided via telemedicine.  I have been provided ample opportunity to ask questions regarding this consent and the Services and have had my questions answered to my satisfaction. I give my informed consent for the services to be provided through the use of telemedicine in my medical care

## 2022-06-18 NOTE — Progress Notes (Signed)
Case: [id number] Date/Time: 06/26/22 1222   Procedures:      CYSTOSCOPY WITH BILATERAL RETROGRADE PYELOGRAM (Bilateral)     TRANSURETHRAL RESECTION OF BLADDER TUMOR (TURBT) - 30 MINS FOR CASE   Anesthesia type: General   Pre-op diagnosis: GROSS HEMATURIA   Location: [facility] PROCEDURE [facility] / [facility]   Surgeons: Crista Elliot, MD       DISCUSSION: James Wall is a 57 year old male who presents to PAT clinic prior to surgery listed above.  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  Patient last saw Cardiology on 10/22/21. Was noted to be stable at that point. Had a telephone cardiac clearance visit on 5/21:  "1.  Preoperative Cardiovascular Risk Assessment:   According to the Revised Cardiac Risk Index (RCRI), his Perioperative Risk of Major Cardiac Event is (%): 11. His Functional Capacity in METs is: 5.72 according to the Duke Activity Status Index (DASI). Therefore, based on ACC/AHA guidelines, patient would be at acceptable risk for the planned procedure without further cardiovascular testing.   Pt states he is no longer taking Plavix.   The patient was advised that if he develops new symptoms prior to surgery to contact our office to arrange for a follow-up visit, and he verbalized understanding."  Follows with [facility] - last seen 10/22/21. Adherent to dialysis sessions. BP is better controlled.   No prior anesthesia complications.   VS: BP (!) 162/99   Pulse 83   Temp 36.9 C (Oral)   Resp 16   Ht 5\' 5"  (1.651 m)   Wt 84.8 kg   SpO2 100%   BMI 31.12 kg/m   PROVIDERS: Shelby Mattocks, DO Cardiology: Riley Lam,  MD Nephrology: [facility]  LABS: Labs reviewed: Acceptable for surgery. (all labs ordered are listed, but only abnormal results are displayed)  Labs Reviewed  CBC - Abnormal; Notable for the following components:      Result Value   RBC 3.63 (*)    Hemoglobin 12.6 (*)    MCV 107.4 (*)    MCH 34.7 (*)    All other components within normal limits  SURGICAL PCR SCREEN     IMAGES:  CXR 09/24/21:  IMPRESSION: 1. Right-sided dialysis catheter with tip overlying the mid SVC. No pneumothorax. 2. Unchanged cardiomegaly.  Bibasilar atelectasis.  EKG 09/24/21:  NSR LAD Minimal voltage criteria for LVH, may be normal variant T wave abnormality, consider lateral ischemia Prolonged QT   CV:  Echo 09/15/21:   IMPRESSIONS     1. Left ventricular ejection fraction, by estimation, is 55 to 60%. The  left ventricle has normal function. The left ventricle has no regional  wall motion abnormalities. There is moderate concentric left ventricular  hypertrophy. Left ventricular  diastolic parameters are consistent with Grade III diastolic dysfunction  (restrictive). Elevated left atrial pressure.   2. Right ventricular systolic function is normal. The right ventricular  size is normal. There is severely elevated pulmonary artery systolic  pressure. The estimated right ventricular systolic pressure is 91.0 mmHg.   3. Left atrial size was severely dilated.   4. The mitral valve is normal in structure. Mild to moderate mitral valve  regurgitation. No evidence of mitral stenosis.   5.  Tricuspid valve regurgitation is mild to moderate.   6. The aortic valve is tricuspid. Aortic valve regurgitation is not  visualized. No aortic stenosis is present.   7. The inferior vena cava is dilated in size with <50% respiratory  variability, suggesting right atrial pressure of 15 mmHg.   Comparison(s): RVSP is worse than prior; stable LV function.   Telemetry  08/01/19:  Patient had a min HR of 53 bpm, max HR of 148 bpm, and avg HR of 62 BPM. 7 Supraventricular Tachycardia runs occurred, the longest lasting 18 beats with an avg rate of 112 bpm.   Predominant underlying rhythm was Sinus Rhythm.  Few very short runs of SVTs that don't correlate with patient's symptoms. Rare PVCs, PACs, 1 blocked sinus beat.   Continue the same management.   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;  dialysis at [name] on MWF   First degree heart block    GERD (gastroesophageal reflux disease)    Hiatal hernia    History of acute respiratory failure 07/2013   admission;  HCAP w/ ARF with hypoxia   History of adenomatous polyp of colon    History of ascites    s/p paracentesis 01-31-2013 (5L) and last one 03-28-2013 (2.7L)   History of community acquired pneumonia 08/08/2020   admission ; LLL , POA   History of heart murmur in childhood    History of MRSA infection 12/19/2012   hospital admission;  w/ sepsis MRSA bacteramia AVF infection   History of sepsis 03/2017   admission;   HCAP w/ sepsis   Hyperlipidemia    Hypertension    Hypertensive heart disease    cardiologist--- dr Merlyn Lot;  nuclear stress test 06-16-2013 intermediate risk w/ mid-distal anterior wall ischemia , not gated;  cardiac cath 07-13-2013 in epic showed normal coronaries and LVSF,  ef not assessed, LCEDP   Hypertensive  nephrosclerosis, 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)    Intolerance to cold    due to anemia   Malignant neoplasm prostate [facility])    urologist--- dr bell/  radiation onologist--- dr Kathrynn Running;  dx 01/ 2023,  Gleason 4+3, PSA 32   NICM (nonischemic cardiomyopathy) (HCC)    followed by cardiology;   last echo in epic 09-13-2020 ef 55-60%   OSA (obstructive sleep apnea) 2009   no  longer using cpap since the yr started 2009;   sleep study in epic 05-11-2007 severe osa   Pneumonia    PSVT (paroxysmal supraventricular tachycardia)    event monitor 08-01-2019  SR w/ SVT runs , rare PAC/ PVC   Secondary hyperparathyroidism (HCC)    Stroke (HCC)    2023 weakness Lhand and Left leg   Wears glasses     Past Surgical History:  Procedure Laterality Date   AV FISTULA PLACEMENT Right 02/22/2013   Procedure:  CREATION  OF BRACHIAL CEPHALIC FISTULA RIGHT ARM;  Surgeon: Sherren Kerns, MD;  [facility] OR;  Service: Vascular;  Laterality: Right;   AV FISTULA PLACEMENT Left 08/10/2014   Procedure: BASILIC VEIN TRANSPOSITION  ARTERIOVENOUS (AV) FISTULA CREATION LEFT UPPER ARM;  Surgeon: Pryor Ochoa, MD;  Location: [facility] OR;  Service: Vascular;  Laterality: Left;   AV FISTULA PLACEMENT, RADIOCEPHALIC Left 02/22/2007   @[facility] ;  Left lower arm   COLONOSCOPY  11/30/2018   by dr Orvan Falconer   ESOPHAGOGASTRODUODENOSCOPY (EGD) WITH PROPOFOL N/A 04/12/2013   Procedure: ESOPHAGOGASTRODUODENOSCOPY (EGD) WITH PROPOFOL;  Surgeon: Willis Modena, MD;  [facility];  Service: Endoscopy;  Laterality: N/A;   EXCHANGE OF A DIALYSIS CATHETER Right 09/24/2021   Procedure: EXCHANGE OF A DIALYSIS CATHETER;  Surgeon: Chuck Hint, MD;  Location: [facility] OR;  Service: Vascular;  Laterality: Right;   GOLD SEED IMPLANT N/A 06/11/2021   Procedure: GOLD SEED IMPLANT;  Surgeon: Jannifer Hick, MD;  [facility];  Service: Urology;  Laterality: N/A;   INSERTION OF DIALYSIS CATHETER N/A  12/23/2012   Procedure: INSERTION OF DIALYSIS CATHETER; ULTRASOUND GUIDED;  Surgeon: Chuck Hint, MD;  Location: [facility] OR;  Service: Vascular;  Laterality: N/A;   INSERTION OF DIALYSIS CATHETER  10/22/2015   Right IJ non-tunneled HD catheter, placed again in 2019   IR FLUORO GUIDE CV LINE RIGHT  08/18/2019   IR FLUORO GUIDE CV LINE RIGHT  10/07/2019   IR GENERIC HISTORICAL  10/22/2015   IR US GUIDE VASC ACCESS RIGHT 10/22/2015 [facility]   IR GENERIC HISTORICAL  10/22/2015   IR FLUORO GUIDE CV LINE RIGHT 10/22/2015 [facility]   IR GENERIC HISTORICAL  10/23/2015   IR FLUORO GUIDE CV LINE RIGHT 10/23/2015 Jolaine Click, MD [facility]   LEFT HEART CATHETERIZATION WITH CORONARY ANGIOGRAM N/A 07/13/2013   Procedure: LEFT HEART CATHETERIZATION WITH CORONARY ANGIOGRAM;  Surgeon: Corky Crafts, MD;  Location: [facility] C[facility] LAB;  Service: Cardiovascular;  Laterality: N/A;   LIGATION OF ARTERIOVENOUS  FISTULA Left 12/22/2012   Procedure: LIGATION OF ARTERIOVENOUS  FISTULA;EXCISION OF LARGE ANEURYSMS;;  Surgeon: Sherren Kerns, MD;  Location[facility] OR;  Service: Vascular;  Laterality: Left;   SPACE OAR INSTILLATION N/A 06/11/2021   Procedure: SPACE OAR INSTILLATION;  Surgeon: Jannifer Hick, MD;  [facility];  Service: Urology;  Laterality: N/A;   UPPER GASTROINTESTINAL ENDOSCOPY  09/07/2018   by dr Orvan Falconer    MEDICATIONS:  acetaminophen (TYLENOL) 325 MG tablet   amLODipine (NORVASC) 10 MG tablet   cloNIDine (CATAPRES - DOSED IN MG/24 HR) 0.3 mg/24hr patch   clopidogrel (PLAVIX) 75 MG tablet   hydrALAZINE (APRESOLINE) 50 MG tablet   isosorbide dinitrate (ISORDIL) 20 MG tablet   latanoprost (XALATAN) 0.005 % ophthalmic solution   metoprolol tartrate (LOPRESSOR) 100 MG tablet   oxybutynin (DITROPAN) 5 MG tablet   pantoprazole (PROTONIX) 20 MG tablet   rosuvastatin (CRESTOR) 10 MG tablet   SENSIPAR 90 MG tablet   sevelamer carbonate (RENVELA) 800 MG tablet   tamsulosin (FLOMAX) 0.4  MG CAPS capsule   No current facility-administered medications for this encounter.   Marcille Blanco [facility] Short Stay/Anesthesiology [facility] Phone [phone] 06/25/2022 8:51 AM

## 2022-06-24 ENCOUNTER — Ambulatory Visit: Payer: 59 | Attending: Nurse Practitioner | Admitting: Nurse Practitioner

## 2022-06-24 DIAGNOSIS — Z01810 Encounter for preprocedural cardiovascular examination: Secondary | ICD-10-CM

## 2022-06-24 NOTE — Progress Notes (Signed)
Virtual Visit via Telephone Note   Because of James Wall's co-morbid illnesses, he is at least at moderate risk for complications without adequate follow up.  This format is felt to be most appropriate for this patient at this time.  The patient did not have access to video technology/had technical difficulties with video requiring transitioning to audio format only (telephone).  All issues noted in this document were discussed and addressed.  No physical exam could be performed with this format.  Please refer to the patient's chart for his consent to telehealth for [facility].  Evaluation Performed:  Preoperative cardiovascular risk assessment _____________   Date:  06/24/2022   Patient ID:  James Wall, DOB Oct 04, 1965, MRN [id number] Patient Location:  Home Provider location:   Office  Primary Care Provider:  Shelby Mattocks, DO Primary Cardiologist:  Christell Constant, MD  Chief Complaint / Patient Profile   57 y.o. y/o male with a h/o chronic diastolic heart failure, SVT, hypertension, coronary calcium, aortic atherosclerosis, anemia, CVA, ESRD on HD, and tobacco use who is pending Cystoscopy and possible re-section of bladder tumor with Dr. Alvester Morin of [facility] and presents today for telephonic preoperative cardiovascular risk assessment.  History of Present Illness    James Wall is a 57 y.o. male who presents via audio/video conferencing for a telehealth visit today.  Pt was last seen in cardiology clinic on 10/22/2021 by Dr. Izora Ribas. At that time Rickie Coye was doing well.  The patient is now pending procedure as outlined above. Since his last visit, he has been stable from a cardiac standpoint.   He denies chest pain, palpitations, dyspnea, pnd, orthopnea, n, v, dizziness, syncope, edema, weight gain, or early satiety. All other systems reviewed and are otherwise negative except as noted above.   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 [facility])    primary cancer is prostate   Anemia associated with chronic renal failure    Anxiety    Arthritis    CHF (congestive heart failure) (HCC)    Cirrhosis, nonalcoholic (HCC)    per pt possible from a medication , unsure ;   last ultrasound 08-09-2020 in epic no fibrosis   COPD (chronic obstructive pulmonary disease) (HCC)    Depression    ESRD on hemodialysis (HCC) 2009   Started HD Jan 2009;  ESRD secondary to hypertensive nephrosclerosis;  dialysis at [facility] on MWF   First degree heart block    GERD (gastroesophageal reflux disease)    Hiatal hernia    History of acute respiratory failure 07/2013   admission;  HCAP w/ ARF with hypoxia   History of adenomatous polyp of colon    History of ascites    s/p paracentesis 01-31-2013 (5L) and last one 03-28-2013 (2.7L)   History of community acquired pneumonia 08/08/2020   admission ; LLL , POA   History of heart murmur in childhood    History of MRSA infection 12/19/2012   hospital admission;  w/ sepsis MRSA bacteramia AVF infection   History of sepsis 03/2017   admission;   HCAP w/ sepsis   Hyperlipidemia    Hypertension    Hypertensive heart disease    cardiologist--- dr Merlyn Lot;  nuclear stress test 06-16-2013 intermediate risk w/ mid-distal anterior wall ischemia , not gated;  cardiac cath 07-13-2013 in epic showed normal coronaries  and LVSF,  ef not assessed, LCEDP   Hypertensive nephrosclerosis, 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)    Intolerance to cold    due to anemia   Malignant neoplasm prostate [facility])    urologist--- dr bell/  radiation onologist--- dr Kathrynn Running;  dx 01/ 2023,  Gleason 4+3, PSA 32   NICM (nonischemic cardiomyopathy) (HCC)    followed by cardiology;   last echo in epic 09-13-2020 ef 55-60%   OSA (obstructive sleep apnea)  2009   no  longer using cpap since the yr started 2009;   sleep study in epic 05-11-2007 severe osa   Pneumonia    PSVT (paroxysmal supraventricular tachycardia)    event monitor 08-01-2019  SR w/ SVT runs , rare PAC/ PVC   Secondary hyperparathyroidism (HCC)    Stroke (HCC)    2023 weakness Lhand and Left leg   Wears glasses    Past Surgical History:  Procedure Laterality Date   AV FISTULA PLACEMENT Right 02/22/2013   Procedure:  CREATION  OF BRACHIAL CEPHALIC FISTULA RIGHT ARM;  Surgeon: Sherren Kerns, MD;  Location: [facility] OR;  Service: Vascular;  Laterality: Right;   AV FISTULA PLACEMENT Left 08/10/2014   Procedure: BASILIC VEIN TRANSPOSITION  ARTERIOVENOUS (AV) FISTULA CREATION LEFT UPPER ARM;  Surgeon: Pryor Ochoa, MD;  Location: [facility] OR;  Service: Vascular;  Laterality: Left;   AV FISTULA PLACEMENT, RADIOCEPHALIC Left 02/22/2007   @[facility] ;  Left lower arm   COLONOSCOPY  11/30/2018   by dr Orvan Falconer   ESOPHAGOGASTRODUODENOSCOPY (EGD) WITH PROPOFOL N/A 04/12/2013   Procedure: ESOPHAGOGASTRODUODENOSCOPY (EGD) WITH PROPOFOL;  Surgeon: Willis Modena, MD;  Location: [facility] ENDOSCOPY;  Service: Endoscopy;  Laterality: N/A;   EXCHANGE OF A DIALYSIS CATHETER Right 09/24/2021   Procedure: EXCHANGE OF A DIALYSIS CATHETER;  Surgeon: Chuck Hint, MD;  Location: [facility] OR;  Service: Vascular;  Laterality: Right;   GOLD SEED IMPLANT N/A 06/11/2021   Procedure: GOLD SEED IMPLANT;  Surgeon: Jannifer Hick, MD;  Location: [facility] ORS;  Service: Urology;  Laterality: N/A;   INSERTION OF DIALYSIS CATHETER N/A 12/23/2012   Procedure: INSERTION OF DIALYSIS CATHETER; ULTRASOUND GUIDED;  Surgeon: Chuck Hint, MD;  Location: [facility] OR;  Service: Vascular;  Laterality: N/A;   INSERTION OF DIALYSIS CATHETER  10/22/2015   Right IJ non-tunneled HD catheter, placed again in 2019   IR FLUORO GUIDE CV LINE RIGHT  08/18/2019   IR FLUORO GUIDE CV LINE RIGHT  10/07/2019   IR GENERIC HISTORICAL  10/22/2015   IR US  GUIDE VASC ACCESS RIGHT 10/22/2015 [facility]   IR GENERIC HISTORICAL  10/22/2015   IR FLUORO GUIDE CV LINE RIGHT 10/22/2015 [facility]   IR GENERIC HISTORICAL  10/23/2015   IR FLUORO GUIDE CV LINE RIGHT 10/23/2015 Jolaine Click, MD [facility]   LEFT HEART CATHETERIZATION WITH CORONARY ANGIOGRAM N/A 07/13/2013   Procedure: LEFT HEART CATHETERIZATION WITH CORONARY ANGIOGRAM;  Surgeon: Corky Crafts, MD;  Location: [facility] CATH LAB;  Service: Cardiovascular;  Laterality: N/A;   LIGATION OF ARTERIOVENOUS  FISTULA Left 12/22/2012   Procedure: LIGATION OF ARTERIOVENOUS  FISTULA;EXCISION OF LARGE ANEURYSMS;;  Surgeon: Sherren Kerns, MD;  Location: [facility] OR;  Service: Vascular;  Laterality: Left;   SPACE OAR INSTILLATION N/A 06/11/2021   Procedure: SPACE OAR INSTILLATION;  Surgeon: Jannifer Hick, MD;  Location: [facility] ORS;  Service: Urology;  Laterality: N/A;   UPPER GASTROINTESTINAL ENDOSCOPY  09/07/2018   by dr  beavers    Allergies  Allergies  Allergen Reactions   Venofer  [Ferric Oxide] Itching   Aspirin Other (See Comments)    Avoids due to renal disease;  Made congestion worse     Home Medications    Prior to Admission medications   Medication Sig Start Date End Date Taking? Authorizing Provider  acetaminophen (TYLENOL) 325 MG tablet Take 2 tablets (650 mg total) by mouth every 4 (four) hours as needed for moderate pain or mild pain. 09/30/21   Angiulli, Mcarthur Rossetti, PA-C  amLODipine (NORVASC) 10 MG tablet Take 1 tablet (10 mg total) by mouth at bedtime. 12/05/21   Christell Constant, MD  cloNIDine (CATAPRES - DOSED IN MG/24 HR) 0.3 mg/24hr patch Place 1 patch (0.3 mg total) onto the skin once a week on Sunday. 01/30/22   Christell Constant, MD  clopidogrel (PLAVIX) 75 MG tablet Take 1 tablet (75 mg total) by mouth daily. 12/05/21   Christell Constant, MD  hydrALAZINE (APRESOLINE) 50 MG tablet Take 1 tablet (50 mg total) by mouth 2 (two) times daily. 12/05/21   Christell Constant, MD  isosorbide dinitrate (ISORDIL) 20 MG tablet Take 1 tablet (20 mg total) by mouth 2 (two) times daily. 12/05/21   Chandrasekhar, Rondel Jumbo, MD  latanoprost (XALATAN) 0.005 % ophthalmic solution Apply to eye. 04/14/22   [provider]  metoprolol tartrate (LOPRESSOR) 100 MG tablet Take 1 tablet (100 mg total) by mouth 2 (two) times daily. 12/05/21   Chandrasekhar, Rondel Jumbo, MD  oxybutynin (DITROPAN) 5 MG tablet Take 5 mg by mouth 2 (two) times daily as needed for bladder spasms.    [provider]  pantoprazole (PROTONIX) 20 MG tablet TAKE 1 TABLET(20 MG) BY MOUTH DAILY 01/09/22   Shelby Mattocks, DO  rosuvastatin (CRESTOR) 10 MG tablet Take 1 tablet (10 mg total) by mouth daily. 12/05/21   Chandrasekhar, Mahesh A, MD  SENSIPAR 90 MG tablet Take 90 mg by mouth daily. 05/07/22   [provider]  sevelamer carbonate (RENVELA) 800 MG tablet Take 4 tablets (3,200 mg total) by mouth 3 (three) times daily with meals. 09/30/21   Angiulli, Mcarthur Rossetti, PA-C  tamsulosin (FLOMAX) 0.4 MG CAPS capsule TAKE 2 CAPSULES(0.8 MG) BY MOUTH DAILY Patient taking differently: Take 0.4 mg by mouth daily. 04/10/22   Shelby Mattocks, DO    Physical Exam    Vital Signs:  Jordy Foltyn does not have vital signs available for review today.  Given telephonic nature of communication, physical exam is limited. AAOx3. NAD. Normal affect.  Speech and respirations are unlabored.  Accessory Clinical Findings    None  Assessment & Plan    1.  Preoperative Cardiovascular Risk Assessment:  According to the Revised Cardiac Risk Index (RCRI), his Perioperative Risk of Major Cardiac Event is (%): 11. His Functional Capacity in METs is: 5.72 according to the Duke Activity Status Index (DASI). Therefore, based on ACC/AHA guidelines, patient would be at acceptable risk for the planned procedure without further cardiovascular testing.  Pt states he is no longer taking Plavix.  The patient was  advised that if he develops new symptoms prior to surgery to contact our office to arrange for a follow-up visit, and he verbalized understanding.  A copy of this note will be routed to requesting surgeon.  Time:   Today, I have spent 5 minutes with the patient with telehealth technology discussing medical history, symptoms, and management plan.     Petra Kuba  Cong Hightower, NP  06/24/2022, 4:06 PM

## 2022-06-25 NOTE — Anesthesia Preprocedure Evaluation (Signed)
Anesthesia Evaluation  Patient identified by MRN, date of birth, ID band Patient awake    Reviewed: Allergy & Precautions, H&P , NPO status , Patient's Chart, lab work & pertinent test results, reviewed documented beta blocker date and time   Airway Mallampati: II  TM Distance: >3 FB Neck ROM: Full    Dental no notable dental hx. (+) Teeth Intact, Dental Advisory Given   Pulmonary asthma , COPD, Current Smoker and Patient abstained from smoking.   Pulmonary exam normal breath sounds clear to auscultation       Cardiovascular hypertension, Pt. on medications and Pt. on home beta blockers +CHF   Rhythm:Regular Rate:Normal     Neuro/Psych   Anxiety Depression    CVA, Residual Symptoms    GI/Hepatic Neg liver ROS, hiatal hernia,GERD  ,,  Endo/Other  Hypothyroidism    Renal/GU ESRF and DialysisRenal disease  negative genitourinary   Musculoskeletal  (+) Arthritis , Osteoarthritis,    Abdominal   Peds  Hematology  (+) Blood dyscrasia, anemia   Anesthesia Other Findings   Reproductive/Obstetrics negative OB ROS                             Anesthesia Physical Anesthesia Plan  ASA: 3  Anesthesia Plan: General   Post-op Pain Management: Tylenol PO (pre-op)*   Induction: Intravenous  PONV Risk Score and Plan: 2 and Ondansetron, Dexamethasone and Midazolam  Airway Management Planned: Oral ETT  Additional Equipment:   Intra-op Plan:   Post-operative Plan: Extubation in OR  Informed Consent: I have reviewed the patients History and Physical, chart, labs and discussed the procedure including the risks, benefits and alternatives for the proposed anesthesia with the patient or authorized representative who has indicated his/her understanding and acceptance.     Dental advisory given  Plan Discussed with: CRNA  Anesthesia Plan Comments: (See PAT note from 5/14 by K Gekas PA-C)         Anesthesia Quick Evaluation

## 2022-06-26 ENCOUNTER — Ambulatory Visit (HOSPITAL_COMMUNITY): Payer: 59 | Admitting: Medical

## 2022-06-26 ENCOUNTER — Encounter (HOSPITAL_COMMUNITY)
Admission: RE | Disposition: A | Discharge status: Home / Self Care | Payer: Self-pay | Source: Ambulatory Visit | Attending: Urology

## 2022-06-26 ENCOUNTER — Ambulatory Visit (HOSPITAL_BASED_OUTPATIENT_CLINIC_OR_DEPARTMENT_OTHER): Payer: 59 | Admitting: Certified Registered"

## 2022-06-26 ENCOUNTER — Encounter (HOSPITAL_COMMUNITY): Payer: Self-pay | Admitting: Urology

## 2022-06-26 ENCOUNTER — Ambulatory Visit (HOSPITAL_COMMUNITY): Payer: 59

## 2022-06-26 ENCOUNTER — Ambulatory Visit (HOSPITAL_COMMUNITY)
Admission: RE | Admit: 2022-06-26 | Discharge: 2022-06-26 | Disposition: A | Discharge status: Home / Self Care | Payer: 59 | Source: Ambulatory Visit | Attending: Urology | Admitting: Urology

## 2022-06-26 DIAGNOSIS — R310 Gross hematuria: Secondary | ICD-10-CM | POA: Insufficient documentation

## 2022-06-26 DIAGNOSIS — K219 Gastro-esophageal reflux disease without esophagitis: Secondary | ICD-10-CM | POA: Insufficient documentation

## 2022-06-26 DIAGNOSIS — I132 Hypertensive heart and chronic kidney disease with heart failure and with stage 5 chronic kidney disease, or end stage renal disease: Secondary | ICD-10-CM

## 2022-06-26 DIAGNOSIS — N186 End stage renal disease: Secondary | ICD-10-CM

## 2022-06-26 DIAGNOSIS — I5032 Chronic diastolic (congestive) heart failure: Secondary | ICD-10-CM

## 2022-06-26 DIAGNOSIS — Z992 Dependence on renal dialysis: Secondary | ICD-10-CM | POA: Diagnosis not present

## 2022-06-26 DIAGNOSIS — K449 Diaphragmatic hernia without obstruction or gangrene: Secondary | ICD-10-CM | POA: Diagnosis not present

## 2022-06-26 DIAGNOSIS — I509 Heart failure, unspecified: Secondary | ICD-10-CM | POA: Insufficient documentation

## 2022-06-26 DIAGNOSIS — Z8673 Personal history of transient ischemic attack (TIA), and cerebral infarction without residual deficits: Secondary | ICD-10-CM | POA: Diagnosis not present

## 2022-06-26 DIAGNOSIS — Z79899 Other long term (current) drug therapy: Secondary | ICD-10-CM | POA: Insufficient documentation

## 2022-06-26 DIAGNOSIS — K7460 Unspecified cirrhosis of liver: Secondary | ICD-10-CM

## 2022-06-26 DIAGNOSIS — M1990 Unspecified osteoarthritis, unspecified site: Secondary | ICD-10-CM | POA: Diagnosis not present

## 2022-06-26 DIAGNOSIS — C61 Malignant neoplasm of prostate: Secondary | ICD-10-CM | POA: Diagnosis not present

## 2022-06-26 DIAGNOSIS — F17210 Nicotine dependence, cigarettes, uncomplicated: Secondary | ICD-10-CM

## 2022-06-26 DIAGNOSIS — J4489 Other specified chronic obstructive pulmonary disease: Secondary | ICD-10-CM | POA: Insufficient documentation

## 2022-06-26 DIAGNOSIS — D631 Anemia in chronic kidney disease: Secondary | ICD-10-CM

## 2022-06-26 HISTORY — PX: CYSTOSCOPY W/ RETROGRADES: SHX1426

## 2022-06-26 LAB — COMPREHENSIVE METABOLIC PANEL
ALT: 11 U/L (ref 0–44)
AST: 19 U/L (ref 15–41)
Albumin: 3.7 g/dL (ref 3.5–5.0)
Alkaline Phosphatase: 109 U/L (ref 38–126)
Anion gap: 15 (ref 5–15)
BUN: 36 mg/dL — ABNORMAL HIGH (ref 6–20)
CO2: 26 mmol/L (ref 22–32)
Calcium: 9.3 mg/dL (ref 8.9–10.3)
Chloride: 97 mmol/L — ABNORMAL LOW (ref 98–111)
Creatinine, Ser: 9.98 mg/dL — ABNORMAL HIGH (ref 0.61–1.24)
GFR, Estimated: 6 mL/min — ABNORMAL LOW (ref >60)
Glucose, Bld: 82 mg/dL (ref 70–99)
Potassium: 5.4 mmol/L — ABNORMAL HIGH (ref 3.5–5.1)
Sodium: 138 mmol/L (ref 135–145)
Total Bilirubin: 0.8 mg/dL (ref 0.3–1.2)
Total Protein: 7.5 g/dL (ref 6.5–8.1)

## 2022-06-26 SURGERY — CYSTOSCOPY, WITH RETROGRADE PYELOGRAM
Anesthesia: General | Laterality: Bilateral

## 2022-06-26 MED ORDER — ORAL CARE MOUTH RINSE: 15.0000 mL | Freq: Once | OROMUCOSAL | Status: AC

## 2022-06-26 MED ORDER — OXYCODONE HCL 5 MG/5ML PO SOLN: 5.0000 mg | Freq: Once | ORAL | Status: AC | PRN

## 2022-06-26 MED ORDER — ONDANSETRON HCL 4 MG/2ML IJ SOLN
INTRAMUSCULAR | Status: DC | PRN
  Administered 2022-06-26: 4 mg via INTRAVENOUS

## 2022-06-26 MED ORDER — OXYCODONE HCL 5 MG PO TABS: 5.0000 mg | ORAL_TABLET | Freq: Once | ORAL | Status: AC | PRN

## 2022-06-26 MED ORDER — IOHEXOL 300 MG/ML  SOLN
INTRAMUSCULAR | Status: DC | PRN
  Administered 2022-06-26: 19 mL

## 2022-06-26 MED ORDER — FENTANYL CITRATE PF 50 MCG/ML IJ SOSY
PREFILLED_SYRINGE | INTRAMUSCULAR | Status: AC
  Filled 2022-06-26: qty 1

## 2022-06-26 MED ORDER — FENTANYL CITRATE PF 50 MCG/ML IJ SOSY: 25.0000 ug | PREFILLED_SYRINGE | INTRAMUSCULAR | Status: DC | PRN

## 2022-06-26 MED ORDER — ACETAMINOPHEN 500 MG PO TABS
ORAL_TABLET | ORAL | Status: AC
  Administered 2022-06-26: 1000 mg via ORAL
  Filled 2022-06-26: qty 2

## 2022-06-26 MED ORDER — PROMETHAZINE HCL 25 MG/ML IJ SOLN: 6.2500 mg | INTRAMUSCULAR | Status: DC | PRN

## 2022-06-26 MED ORDER — ACETAMINOPHEN 160 MG/5ML PO SOLN: 325.0000 mg | ORAL | Status: DC | PRN

## 2022-06-26 MED ORDER — ACETAMINOPHEN 10 MG/ML IV SOLN: 1000.0000 mg | Freq: Once | INTRAVENOUS | Status: DC | PRN

## 2022-06-26 MED ORDER — CHLORHEXIDINE GLUCONATE 0.12 % MT SOLN
15.0000 mL | Freq: Once | OROMUCOSAL | Status: AC
  Administered 2022-06-26: 15 mL via OROMUCOSAL

## 2022-06-26 MED ORDER — FENTANYL CITRATE (PF) 100 MCG/2ML IJ SOLN
INTRAMUSCULAR | Status: DC | PRN
  Administered 2022-06-26 (×3): 50 ug via INTRAVENOUS

## 2022-06-26 MED ORDER — CEFAZOLIN SODIUM-DEXTROSE 2-4 GM/100ML-% IV SOLN
2.0000 g | INTRAVENOUS | Status: AC
  Administered 2022-06-26: 2 g via INTRAVENOUS
  Filled 2022-06-26: qty 100

## 2022-06-26 MED ORDER — PROPOFOL 10 MG/ML IV BOLUS
INTRAVENOUS | Status: DC | PRN
  Administered 2022-06-26: 180 mg via INTRAVENOUS

## 2022-06-26 MED ORDER — SODIUM CHLORIDE 0.9 % IR SOLN
Status: DC | PRN
  Administered 2022-06-26: 3000 mL

## 2022-06-26 MED ORDER — LACTATED RINGERS IV SOLN: INTRAVENOUS | Status: DC

## 2022-06-26 MED ORDER — ACETAMINOPHEN 325 MG PO TABS: 325.0000 mg | ORAL_TABLET | ORAL | Status: DC | PRN

## 2022-06-26 MED ORDER — OXYCODONE HCL 5 MG PO TABS
ORAL_TABLET | ORAL | Status: AC
  Administered 2022-06-26: 5 mg via ORAL
  Filled 2022-06-26: qty 1

## 2022-06-26 MED ORDER — LIDOCAINE HCL (CARDIAC) PF 100 MG/5ML IV SOSY
PREFILLED_SYRINGE | INTRAVENOUS | Status: DC | PRN
  Administered 2022-06-26: 100 mg via INTRATRACHEAL

## 2022-06-26 MED ORDER — FENTANYL CITRATE (PF) 100 MCG/2ML IJ SOLN
INTRAMUSCULAR | Status: AC
  Filled 2022-06-26: qty 2

## 2022-06-26 MED ORDER — ACETAMINOPHEN 500 MG PO TABS: 1000.0000 mg | ORAL_TABLET | Freq: Four times a day (QID) | ORAL | Status: DC | PRN

## 2022-06-26 MED ORDER — SUGAMMADEX SODIUM 200 MG/2ML IV SOLN
INTRAVENOUS | Status: DC | PRN
  Administered 2022-06-26: 200 mg via INTRAVENOUS

## 2022-06-26 SURGICAL SUPPLY — 24 items
BAG DRN RND TRDRP ANRFLXCHMBR (UROLOGICAL SUPPLIES)
BAG URINE DRAIN 2000ML AR STRL (UROLOGICAL SUPPLIES) IMPLANT
BAG URO CATCHER STRL LF (MISCELLANEOUS) ×3 IMPLANT
CATH FOLEY 2WAY SLVR  5CC 18FR (CATHETERS)
CATH FOLEY 2WAY SLVR 5CC 18FR (CATHETERS) IMPLANT
CATH URETL OPEN END 6FR 70 (CATHETERS) ×1 IMPLANT
CLOTH BEACON ORANGE TIMEOUT ST (SAFETY) ×3 IMPLANT
DRAPE FOOT SWITCH (DRAPES) ×3 IMPLANT
ELECT REM PT RETURN 15FT ADLT (MISCELLANEOUS) ×2 IMPLANT
GLOVE BIO SURGEON STRL SZ7.5 (GLOVE) ×3 IMPLANT
GLOVE BIO SURGEON STRL SZ8 (GLOVE) ×2 IMPLANT
GOWN STRL REUS W/ TWL XL LVL3 (GOWN DISPOSABLE) ×3 IMPLANT
GOWN STRL REUS W/TWL XL LVL3 (GOWN DISPOSABLE) ×2
GUIDEWIRE STR DUAL SENSOR (WIRE) ×2 IMPLANT
KIT TURNOVER KIT A (KITS) IMPLANT
LOOP CUT BIPOLAR 24F LRG (ELECTROSURGICAL) IMPLANT
MANIFOLD NEPTUNE II (INSTRUMENTS) ×3 IMPLANT
NS IRRIG 1000ML POUR BTL (IV SOLUTION) IMPLANT
PACK CYSTO (CUSTOM PROCEDURE TRAY) ×3 IMPLANT
PLUG CATH AND CAP STRL 200 (CATHETERS) IMPLANT
SYR TOOMEY IRRIG 70ML (MISCELLANEOUS)
SYRINGE TOOMEY IRRIG 70ML (MISCELLANEOUS) IMPLANT
TUBING CONNECTING 10 (TUBING) ×3 IMPLANT
TUBING UROLOGY SET (TUBING) ×3 IMPLANT

## 2022-06-26 NOTE — Op Note (Signed)
Operative Note  Preoperative diagnosis:  1.  Gross hematuria  Postoperative diagnosis: 1.  Gross hematuria  Procedure(s): 1.  Cystoscopy with bilateral retrograde pyelogram  Surgeon: Modena Slater, MD  Assistants: None  Anesthesia: General  Complications: None immediate  EBL: Minimal  Specimens: 1.  None  Drains/Catheters: 1.  None  Intraoperative findings: Normal urethra.  Nonobstructing prostate.  Bladder mucosa without any tumors or masses.  I thought that he may have had a dimple at the dome representing a transplant ureter but no contrast identified the ureter there.  There were no tumors however.  Bilateral retrograde pyelogram revealed thin ureters and renal pelvis/calyces consistent with his end-stage renal disease.  No filling defect  Indication: 57 year old male underwent hematuria workup.  He declined workup in the office and presents for the previously mentioned operation.  Description of procedure:  The patient was identified and consent was obtained.  The patient was taken to the operating room and placed in the supine position.  The patient was placed under general anesthesia.  Perioperative antibiotics were administered.  The patient was placed in dorsal lithotomy.  Patient was prepped and draped in a standard sterile fashion and a timeout was performed.  A 21 French rigid cystoscope was advanced into the urethra and into the bladder.  Complete cystoscopy was performed with findings noted above.  I saw with dimpling at the dome that I thought may represent a transplant ureter and I intubated that with an open-ended ureteral catheter but no contrast instilled into it.  Therefore, I intubated the left ureteral orifice with an open-ended ureteral catheter and shot a retrograde pyelogram with no abnormal findings.  Same was performed on the right again with no abnormal findings.  I exchanged for a 70 degree lens and inspected the bladder with that and again did not see  any tumors.  I drained the bladder and withdrew the scope.  Patient tolerated the procedure well was stable postoperatively.  Plan: He may follow-up as needed with me.

## 2022-06-26 NOTE — H&P (Signed)
CC/HPI: 1 - Metastatic Prostate Cancer -  Initial DX: 12/12 cores up to 95% Grade 3 cancer on BX 2023 on eval PSA 36  Primary TX: External beam radiatio + androgen deprivation at least 2 years (starting 05/2021) at age 57  Most Recent Restaging: 03/2021 PET - bilateral <2cm pelvic node mets and small left bladder met   Recent Course:  05/2021 - Eligard 45 (1/4); 10/2021 PSA .067, T <10, completed radiation, had CVA  12/2021 - Eligard 45 (2/4)    2 - End Stage Renal Disease / Polycystic Kidney Disease - on dialysis via RT [place] vas cath (numerous fisculas prior). managed by [facility] (not one provider at present). PET 2023 with innumerabl bilaeral renal cysts <2cm, atrophic kidneys, no uptake / enhancement. No h/o significant hematuria.   3 - Urinary Urgency - makes minimal urine on dialysis. Bothered by urgency with small urge leakage after radiation. Starting oxybutynin trial 10/2021, but uses very rarely as bother has lessened.   PMH sig for HTN, CHF, CVA (left hand/leg weakness, just uses cane, plavix). Used to be Production designer, theatre/television/film for food services at [employer], went to [employer]. His friend Maryelizabeth Kaufmann is very involved. His PCP is Derrel Nip MD with [facility].   Today " James Wall " is seen in f/u above. Current on Eligard dosing, due for prostate acner labs, he was given lab slip to be drawn with dialysis preivously but this was not done. had been very reassuring recent prior.   05/29/2022  In the interval, the patient underwent a CT hematuria protocol for an episode of gross hematuria. This revealed bilateral renal calculi but no ureteral or bladder calculi. Had a thick-walled decompressed bladder. Had a 1.8 cm exophytic left renal. Lesion with potential small degree of enhancement that had minimal change since 2019.     ALLERGIES: None   MEDICATIONS: Omeprazole  Oxybutynin Chloride 5 mg tablet 1 tab PO BID PRN Urinary urgency  Tamsulosin Hcl 0.4 mg capsule 1 capsule PO Daily  Clonazepam   Hydralazine Hcl 25 mg tablet     GU PSH: PLACE RT DEVICE/MARKER, PROS - 06/11/2021 Prostate Needle Biopsy - 02/21/2021 Transperineal Plmt Biodegradable Matrl 1/Mlt Njx - 06/11/2021     NON-GU PSH: Surgical Pathology, Gross And Microscopic Examination For Prostate Needle - 02/21/2021     GU PMH: Gross hematuria - 04/29/2022 End-Stage Renal Disease - 02/27/2022, - 10/24/2021, - 06-07-21 Prostate Cancer - 02/27/2022, - 12/04/2021, - 10/24/2021, - Jun 07, 2021, - 05/07/2021 Urinary Urgency - 10/24/2021      PMH Notes: Liver disease, kidney failure   NON-GU PMH: Metastatic lymphadenopathy of multiple regions - 02/27/2022, - 10/24/2021, - 06/07/21, - 05/07/2021 Polycystic kidney, adult type - 10/24/2021, - Jun 07, 2021    FAMILY HISTORY: Death In The Family Father - Other Death In The Family Mother - Other   SOCIAL HISTORY: Marital Status: Single Preferred Language: English; Ethnicity: Not Hispanic Or Latino; Race: Black or African American Current Smoking Status: Patient smokes occasionally. Has smoked since 12/05/1994. Smokes 1/2 pack per day.   Tobacco Use Assessment Completed: Used Tobacco in last 30 days? Does not use smokeless tobacco. Has never drank.  Does not use drugs. Drinks 3 caffeinated drinks per day.    REVIEW OF SYSTEMS:    GU Review Male:   Patient denies frequent urination, hard to postpone urination, burning/ pain with urination, get up at night to urinate, leakage of urine, stream starts and stops, trouble starting your stream, have to strain to urinate ,  erection problems, and penile pain.  Gastrointestinal (Upper):   Patient denies nausea, vomiting, and indigestion/ heartburn.  Gastrointestinal (Lower):   Patient denies diarrhea and constipation.  Constitutional:   Patient denies fever, night sweats, weight loss, and fatigue.  Skin:   Patient denies skin rash/ lesion and itching.  Eyes:   Patient denies blurred vision and double vision.  Ears/ Nose/ Throat:   Patient denies sore  throat and sinus problems.  Hematologic/Lymphatic:   Patient denies easy bruising and swollen glands.  Cardiovascular:   Patient denies leg swelling and chest pains.  Respiratory:   Patient denies cough and shortness of breath.  Endocrine:   Patient denies excessive thirst.  Musculoskeletal:   Patient denies back pain and joint pain.  Neurological:   Patient denies headaches and dizziness.  Psychologic:   Patient denies depression and anxiety.   VITAL SIGNS: None   Complexity of Data:  Source Of History:  Patient  Records Review:   Previous Doctor Records, Previous Patient Records  X-Ray Review: C.T. Abdomen/Pelvis: Reviewed Films. Reviewed Report. Discussed With Patient.     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 Recommend cystoscopy with bilateral retrograde pyelogram, possible intervention such as TURBT in the operating room. Risk benefits discussed. He is on dialysis. Good likelihood bleeding is from history of radiation.   He will continue to follow-up with advanced prostate cancer clinic for his metastatic prostate cancer.   CC: Dr. Deirdre Priest        Next Appointment:      Next Appointment: 07/03/2022 09:15 AM    Appointment Type: Drug Therapy    Location: [facility] [phone]    Provider: [facility]    Reason for Visit: 4 mo OV - Eligard     Signed by Modena Slater, III, M.D. on 05/29/22 at 1:09 PM (EDT)

## 2022-06-26 NOTE — Anesthesia Procedure Notes (Signed)
Procedure Name: Intubation Date/Time: 06/26/2022 12:26 PM  Performed by: [place], Clydene Pugh, CRNAPre-anesthesia Checklist: Patient identified, Emergency Drugs available, Suction available and Patient being monitored Patient Re-evaluated:Patient Re-evaluated prior to induction Oxygen Delivery Method: Circle system utilized Preoxygenation: Pre-oxygenation with 100% oxygen Induction Type: IV induction Ventilation: Mask ventilation without difficulty Laryngoscope Size: Mac and 4 Grade View: Grade I Tube type: Oral Tube size: 7.5 mm Number of attempts: 1 Airway Equipment and Method: Stylet and Oral airway Placement Confirmation: ETT inserted through vocal cords under direct vision, positive ETCO2 and breath sounds checked- equal and bilateral Secured at: 23 cm Tube secured with: Tape Dental Injury: Teeth and Oropharynx as per pre-operative assessment

## 2022-06-26 NOTE — Discharge Instructions (Addendum)
You may notice some blood per urethra.  This is normal as long as it is not persistent and significant.

## 2022-06-26 NOTE — Transfer of Care (Signed)
Immediate Anesthesia Transfer of Care Note  Patient: James Wall  Procedure(s) Performed: CYSTOSCOPY WITH BILATERAL RETROGRADE PYELOGRAM (Bilateral)  Patient Location: PACU  Anesthesia Type:General  Level of Consciousness: awake, alert , and oriented  Airway & Oxygen Therapy: Patient Spontanous Breathing  Post-op Assessment: Report given to RN and Post -op Vital signs reviewed and stable  Post vital signs: Reviewed and stable  Last Vitals:  Vitals Value Taken Time  BP 161/106 06/26/22 1304  Temp    Pulse    Resp 15 06/26/22 1308  SpO2    Vitals shown include unvalidated device data.  Last Pain:  Vitals:   06/26/22 1012  TempSrc: Oral  PainSc:       Patients Stated Pain Goal: 4 (06/26/22 1000)  Complications: No notable events documented.

## 2022-06-27 ENCOUNTER — Encounter (HOSPITAL_COMMUNITY): Payer: Self-pay | Admitting: Urology

## 2022-06-27 NOTE — Anesthesia Postprocedure Evaluation (Signed)
Anesthesia Post Note  Patient: Damarcus Harriger Drummer  Procedure(s) Performed: CYSTOSCOPY WITH BILATERAL RETROGRADE PYELOGRAM (Bilateral)     Patient location during evaluation: PACU Anesthesia Type: General Level of consciousness: awake and alert Pain management: pain level controlled Vital Signs Assessment: post-procedure vital signs reviewed and stable Respiratory status: spontaneous breathing, nonlabored ventilation, respiratory function stable and patient connected to nasal cannula oxygen Cardiovascular status: blood pressure returned to baseline and stable Postop Assessment: no apparent nausea or vomiting Anesthetic complications: no   No notable events documented.  Last Vitals:  Vitals:   06/26/22 1356 06/26/22 1403  BP:  (!) 141/77  Pulse: (!) 55   Resp: 16 16  Temp:  36.6 C  SpO2: 95% 95%    Last Pain:  Vitals:   06/26/22 1330  TempSrc:   PainSc: 4                  Shelton Silvas

## 2022-07-10 ENCOUNTER — Ambulatory Visit: Payer: Self-pay

## 2022-07-10 NOTE — Patient Outreach (Signed)
  Care Coordination   Follow Up Visit Note   07/11/2022 Name: James Wall MRN: [id number] DOB: Jan 01, 1966  James Wall is a 57 y.o. year old male who sees James Mattocks, DO for primary care. I spoke with  James Wall by phone today.  What matters to the patients health and wellness today?  James Wall is doing well. He had his cystoscopy, and they did not see anything, for which he is thankful. He was able to get his hormone shots that covers for the next six months. He denies any chest pain or swelling, and his blood pressure has been around 147/75.    Goals Addressed             This Visit's Progress    I was just out of the hospital for stroke       Patient Goals/Self Care Activities: -Patient/Caregiver will self-administer medications as prescribed as evidenced by self-report/primary caregiver report  -Patient/Caregiver will attend all scheduled provider appointments as evidenced by clinician review of documented attendance to scheduled appointments and patient/caregiver report -Patient/Caregiver will call pharmacy for medication refills as evidenced by patient report and review of pharmacy fill history as appropriate -Patient/Caregiver will call provider office for new concerns or questions as evidenced by review of documented incoming telephone call notes and patient report -Patient/Caregiver verbalizes understanding of plan -Patient/Caregiver will focus on medication adherence by taking medications as prescribed  -Calls provider office for new concerns, questions, or BP outside discussed parameters -Checks BP and record as discussed -Follows a low sodium diet/DASH diet -Follow CHF Action Plan  Evaluation of current treatment plan related to patient's adherence to plan as established by provider Assessed for signs and symptoms of stroke Active listening / Reflection utilized  Emotional Support Provided Problem Solving /Task Center strategies reviewed         SDOH assessments and interventions completed:  No     Care Coordination Interventions:  Yes, provided   Interventions Today    Flowsheet Row Most Recent Value  Chronic Disease   Chronic disease during today's visit Hypertension (HTN), Congestive Heart Failure (CHF)  General Interventions   General Interventions Discussed/Reviewed General Interventions Discussed, General Interventions Reviewed  Nutrition Interventions   Nutrition Discussed/Reviewed Nutrition Discussed        Follow up plan: Follow up call scheduled for 08/21/22  2 pm    Encounter Outcome:  Pt. Visit Completed   [name] RN, BSN, [facility] Care Coordinator [facility]   Phone: [phone]

## 2022-07-11 NOTE — Patient Instructions (Signed)
Visit Information  Thank you for taking time to visit with me today. Please don't hesitate to contact me if I can be of assistance to you.   Following are the goals we discussed today:   Goals Addressed             This Visit's Progress    I was just out of the hospital for stroke       Patient Goals/Self Care Activities: -Patient/Caregiver will self-administer medications as prescribed as evidenced by self-report/primary caregiver report  -Patient/Caregiver will attend all scheduled provider appointments as evidenced by clinician review of documented attendance to scheduled appointments and patient/caregiver report -Patient/Caregiver will call pharmacy for medication refills as evidenced by patient report and review of pharmacy fill history as appropriate -Patient/Caregiver will call provider office for new concerns or questions as evidenced by review of documented incoming telephone call notes and patient report -Patient/Caregiver verbalizes understanding of plan -Patient/Caregiver will focus on medication adherence by taking medications as prescribed  -Calls provider office for new concerns, questions, or BP outside discussed parameters -Checks BP and record as discussed -Follows a low sodium diet/DASH diet -Follow CHF Action Plan  Evaluation of current treatment plan related to patient's adherence to plan as established by provider Assessed for signs and symptoms of stroke Active listening / Reflection utilized  Emotional Support Provided Problem Solving /Task Center strategies reviewed        Our next appointment is by telephone on 08/21/22 at 2 pm  Please call the care guide team at [phone] if you need to cancel or reschedule your appointment.   If you are experiencing a Mental Health or Behavioral Health Crisis or need someone to talk to, please call 1-800-273-TALK (toll free, 24 hour hotline)  Patient verbalizes understanding of instructions and care plan provided  today and agrees to view in MyChart. Active MyChart status and patient understanding of how to access instructions and care plan via MyChart confirmed with patient.     [name] RN, BSN, [facility] Care Coordinator [facility]   Phone: [phone]

## 2022-07-25 IMAGING — US US ABDOMEN LIMITED
1 series · 14 of 25 positions shown · non-contrast
Comparison: Abdominal ultrasound 12/07/2018.

CLINICAL DATA: 55-year-old male with history of right upper
quadrant abdominal pain since [REDACTED].

EXAM:
ULTRASOUND ABDOMEN LIMITED RIGHT UPPER QUADRANT

[Series 1: us abdomen limited ruq (liver/gb) · 14 of 50 slices shown]
[im 1/50]
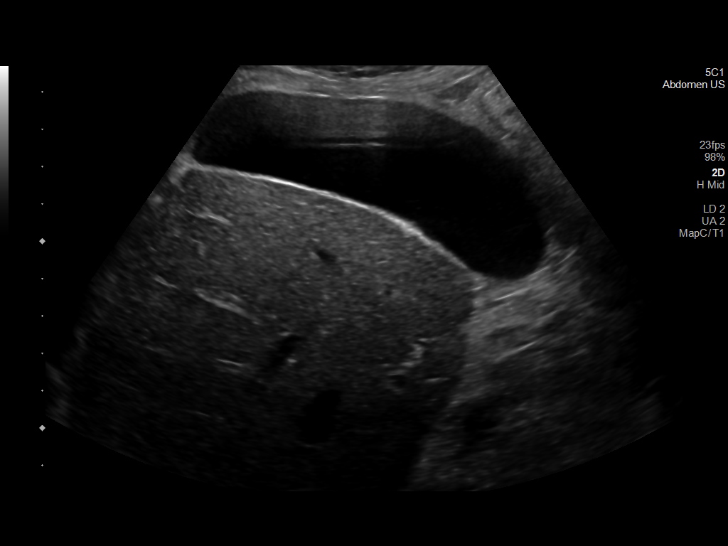
[im 5/50]
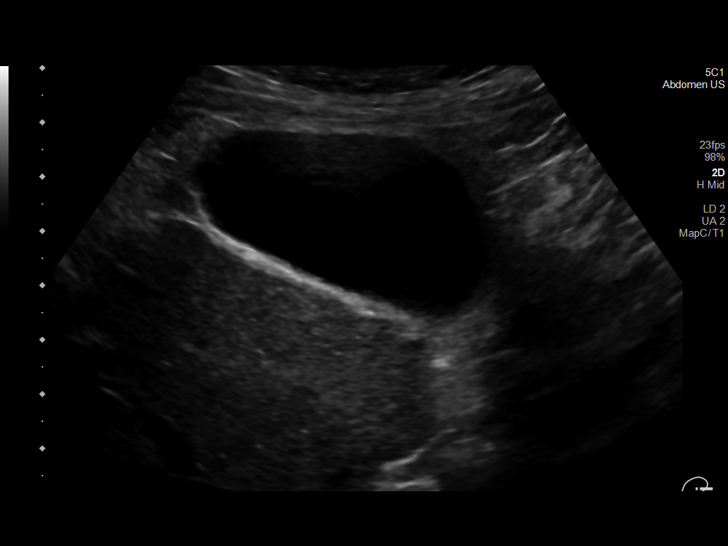
[im 9/50]
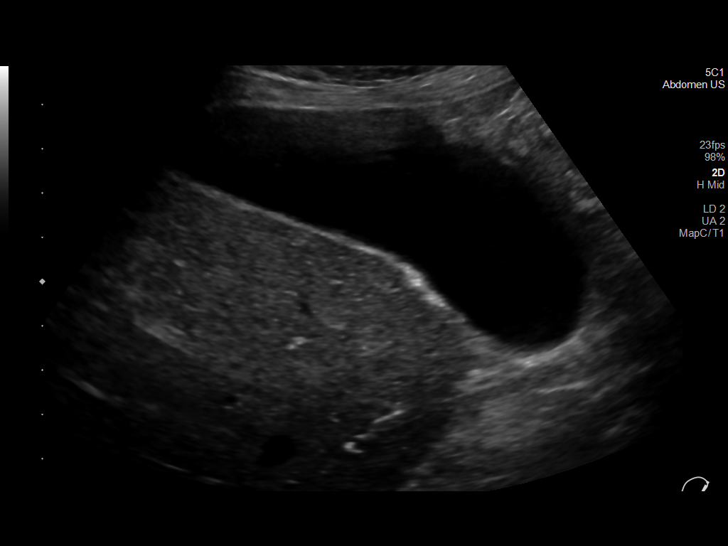
[im 13/50]
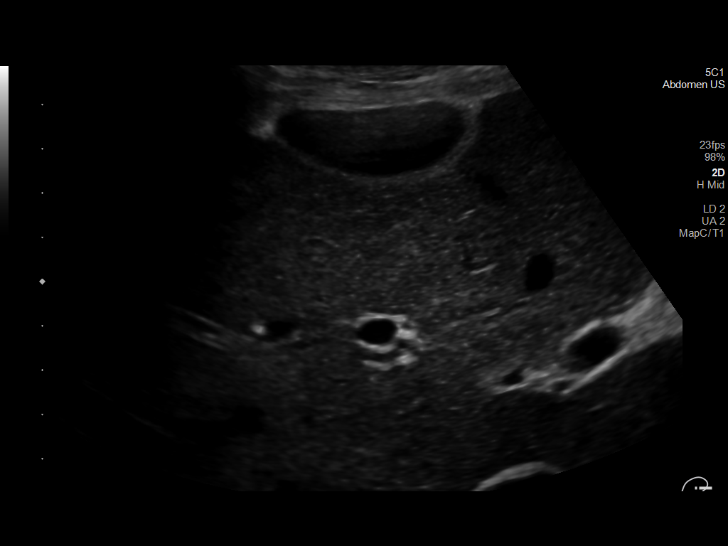
[im 17/50]
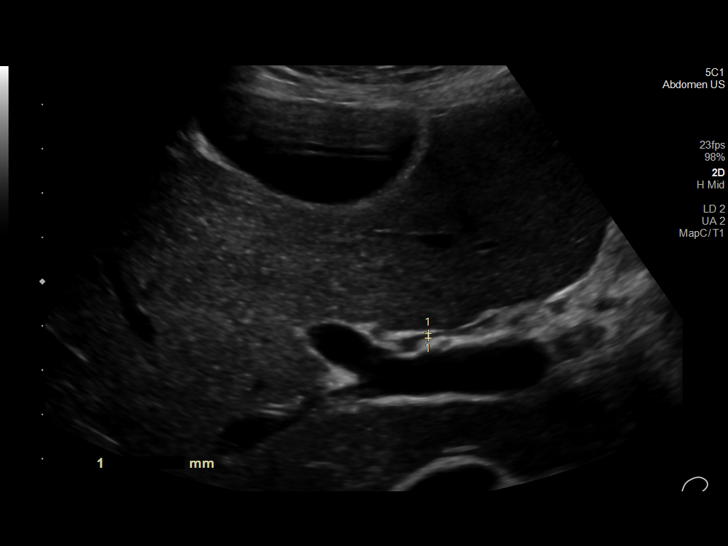
[im 19/50]
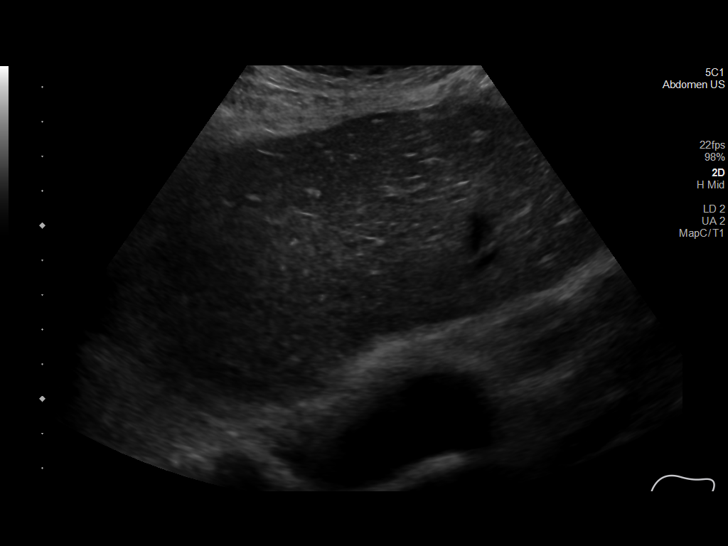
[im 23/50]
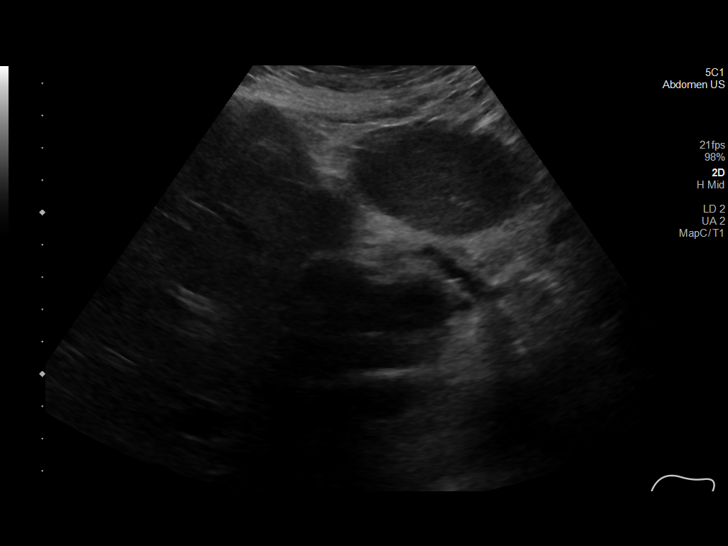
[im 27/50]
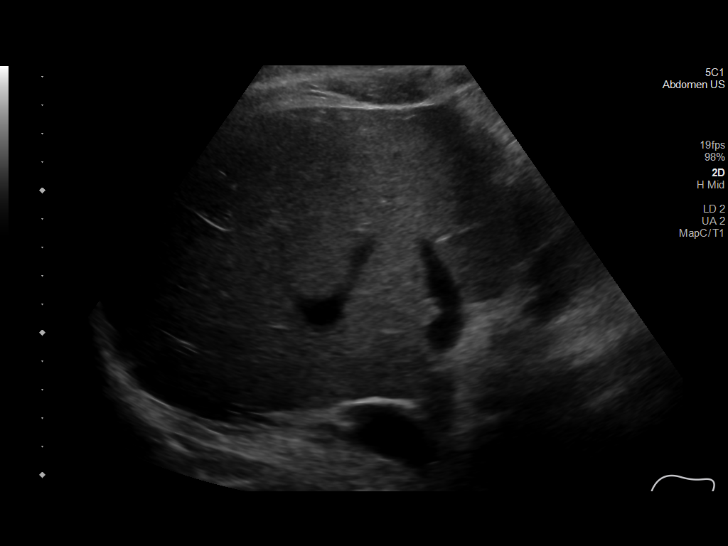
[im 31/50]
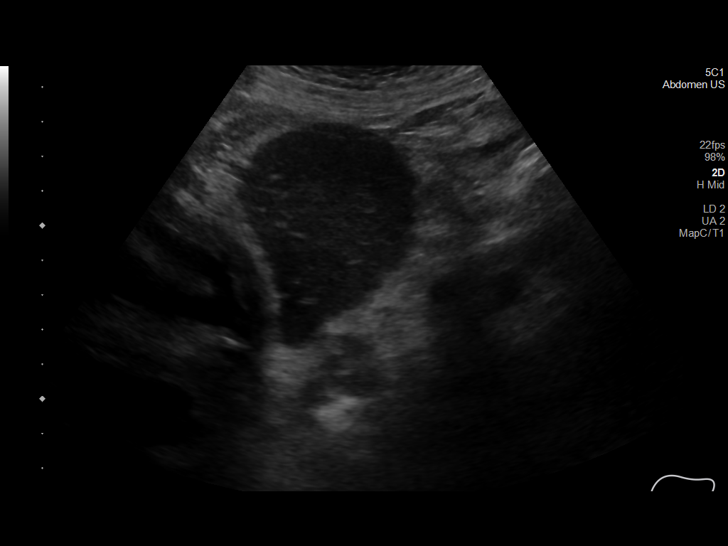
[im 33/50]
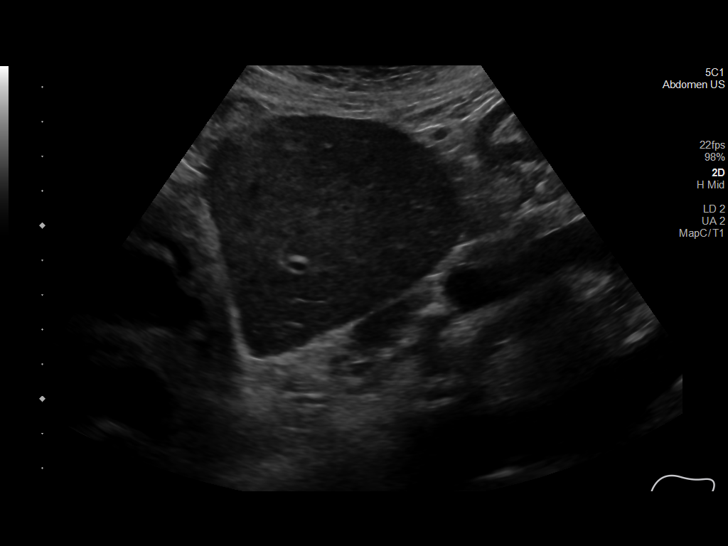
[im 37/50]
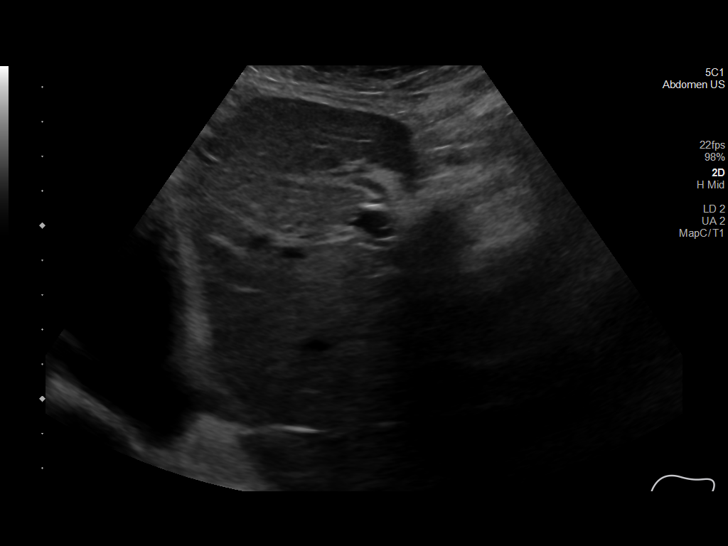
[im 41/50]
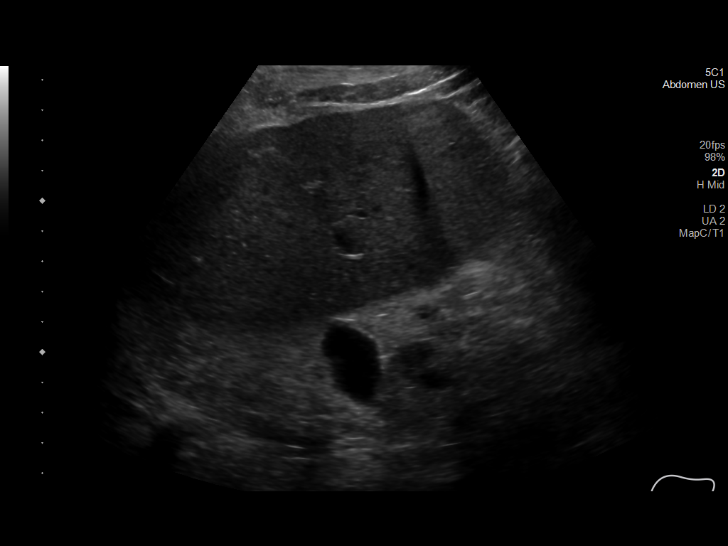
[im 45/50]
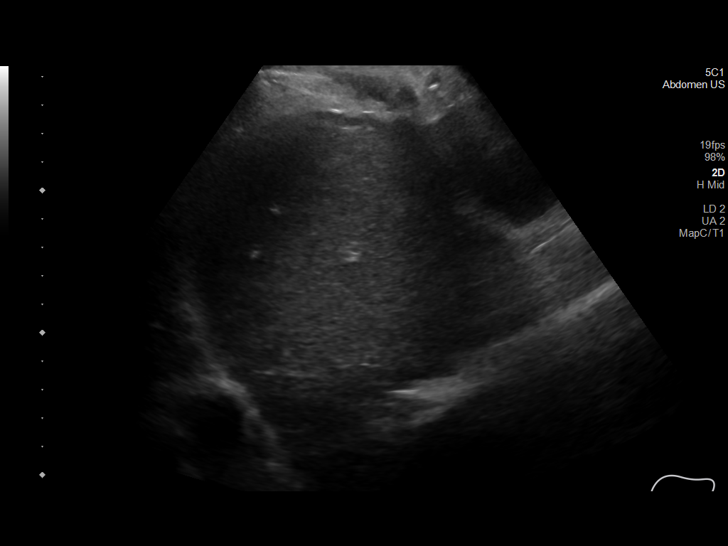
[im 50/50]
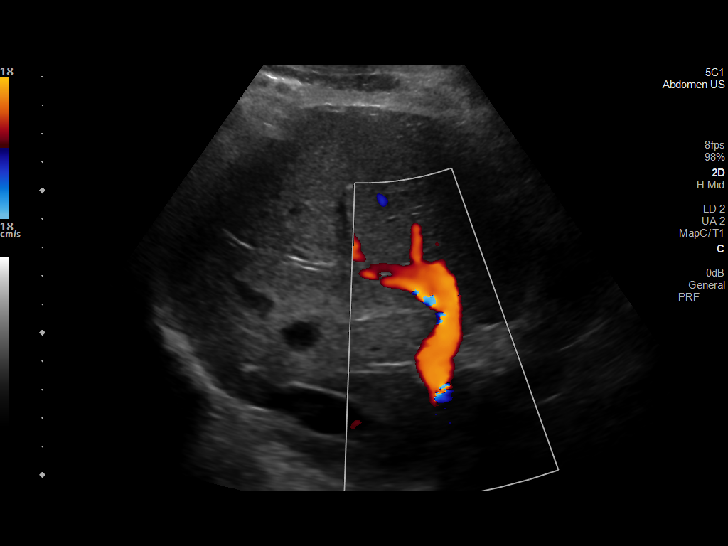

[14 of 25 positions shown; findings below may reference images not displayed]

FINDINGS: Gallbladder:

No gallstones or wall thickening visualized. No sonographic Murphy
sign noted by sonographer.

Common bile duct:

Diameter: 2 mm

Liver:

No focal lesion identified. Liver has a nodular contour with
prominence of the caudate lobe and coarse echotexture, indicative of
underlying cirrhosis. Portal vein is patent on color Doppler imaging
with normal direction of blood flow towards the liver.

Other: Atrophic partially imaged right kidney with multiple small
cysts.
IMPRESSION: 1. Negative for cholelithiasis or findings to suggest acute
cholecystitis.
2. Cirrhosis.

## 2022-07-25 IMAGING — CT CT ABD-PELV W/ CM
2 of 5 series · 16 of 46 positions shown, 18 images · IV contrast (APPLIED)
Comparison: 03/08/2017

CLINICAL DATA: Abdominal pain and fever. Hiccups, shortness of
breath, and nausea.

EXAM:
CT ABDOMEN AND PELVIS WITH CONTRAST
TECHNIQUE: Multidetector CT imaging of the abdomen and pelvis was performed
using the standard protocol following bolus administration of
intravenous contrast.
CONTRAST:  100mL OMNIPAQUE IOHEXOL 300 MG/ML  SOLN

[Series 3: abdomen 5.0 · axial · 0.76mm/px · z∈[-458,-68]mm · 13 of 90 slices shown, 15 images]
[im 6/90  soft-tissue]
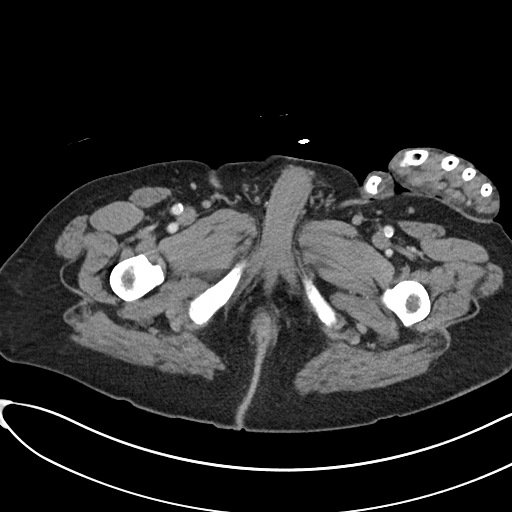
[im 6/90  bone]
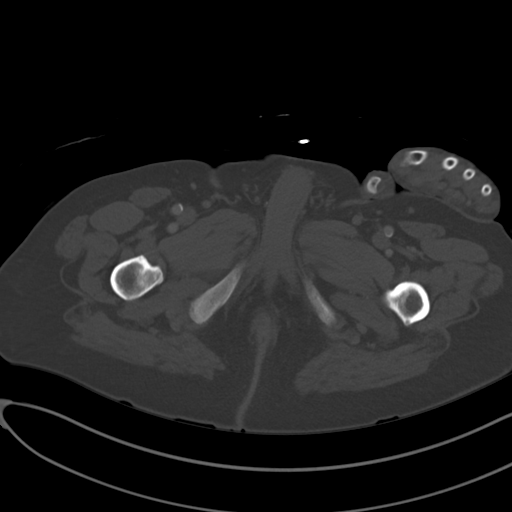
[im 11/90  soft-tissue]
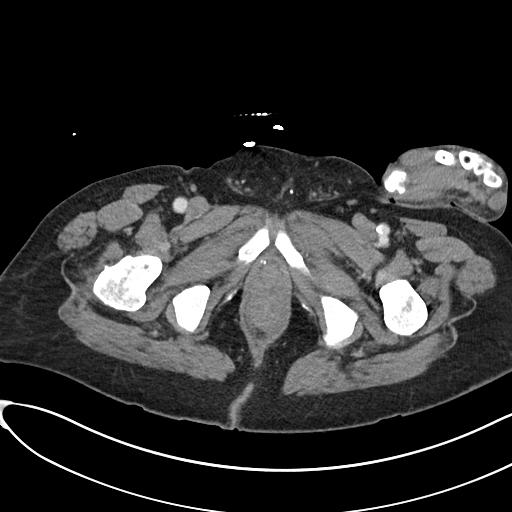
[im 21/90  soft-tissue]
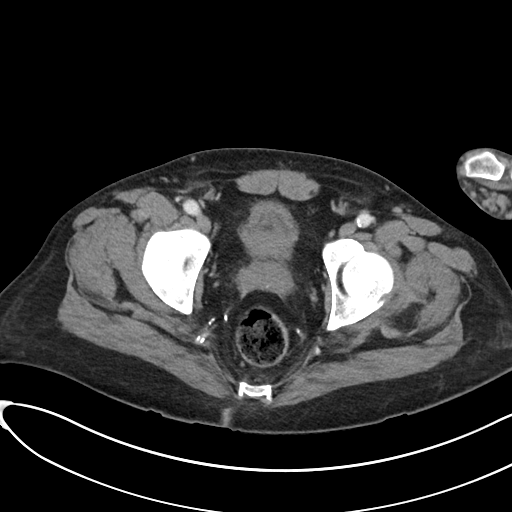
[im 27/90  soft-tissue]
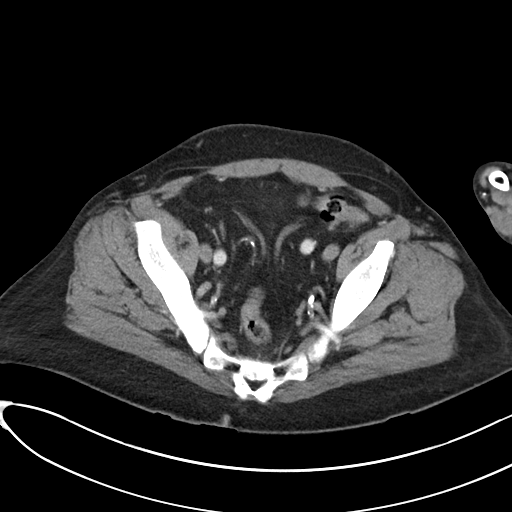
[im 32/90  soft-tissue]
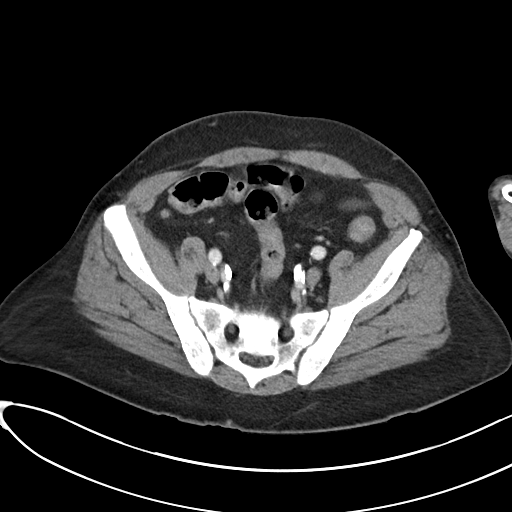
[im 37/90  soft-tissue]
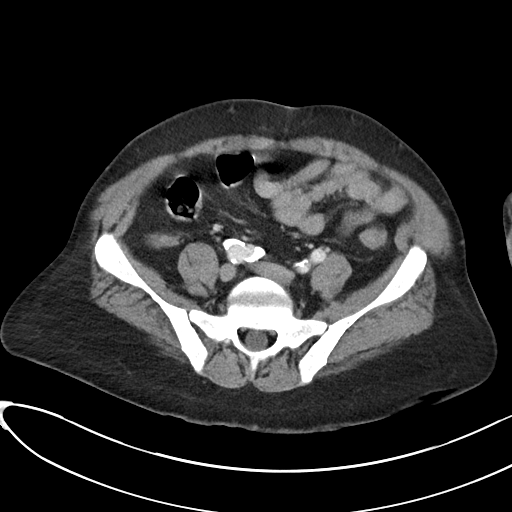
[im 48/90  soft-tissue]
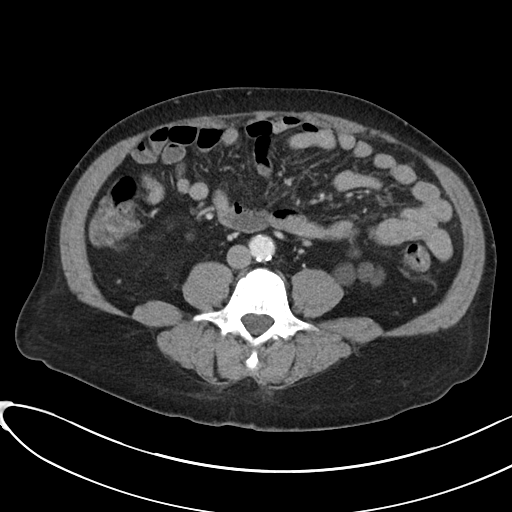
[im 53/90  soft-tissue]
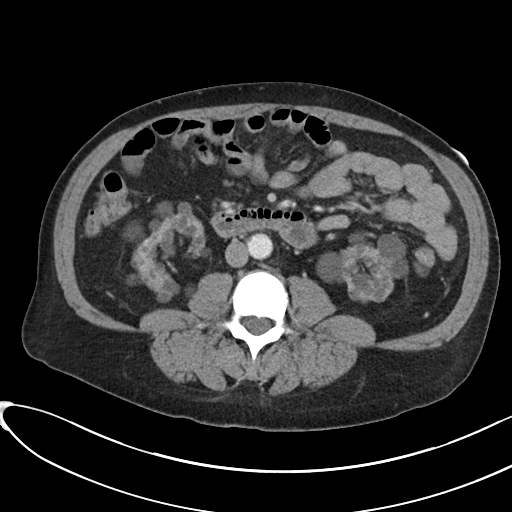
[im 58/90  soft-tissue]
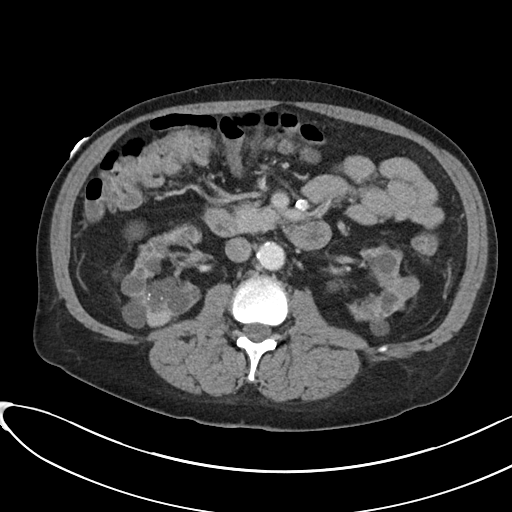
[im 58/90  bone]
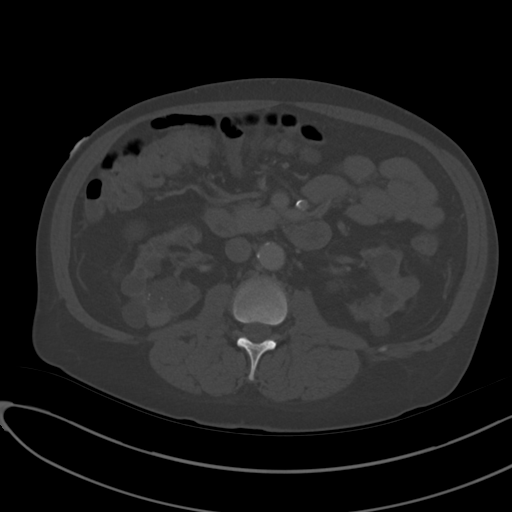
[im 63/90  soft-tissue]
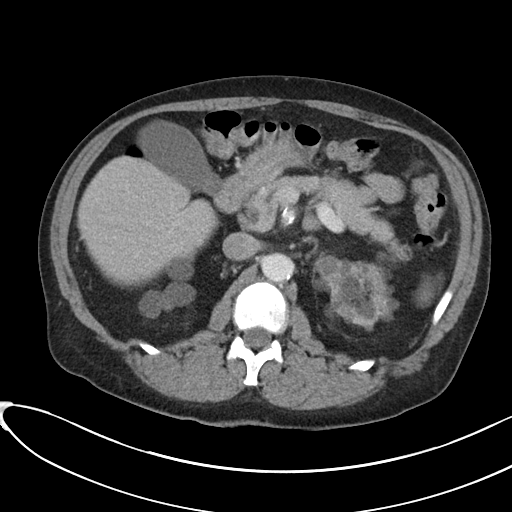
[im 69/90  soft-tissue]
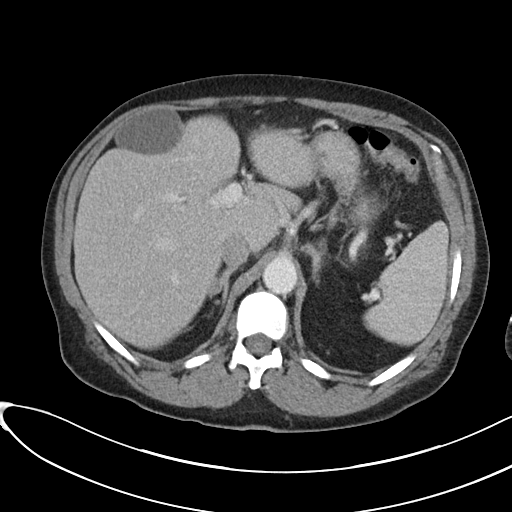
[im 79/90  soft-tissue]
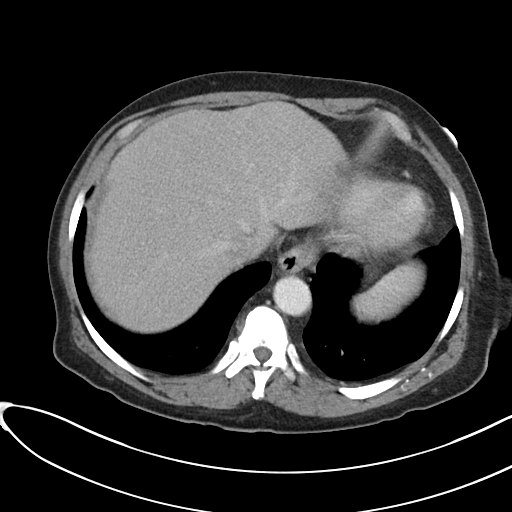
[im 84/90  soft-tissue]
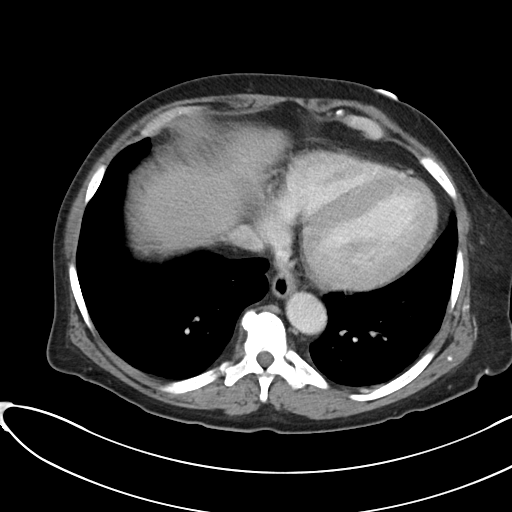

[Series 5: abdomen 3.0 mpr cor · coronal · 0.74mm/px · 3 of 93 slices shown]
[im 31/93  soft-tissue]
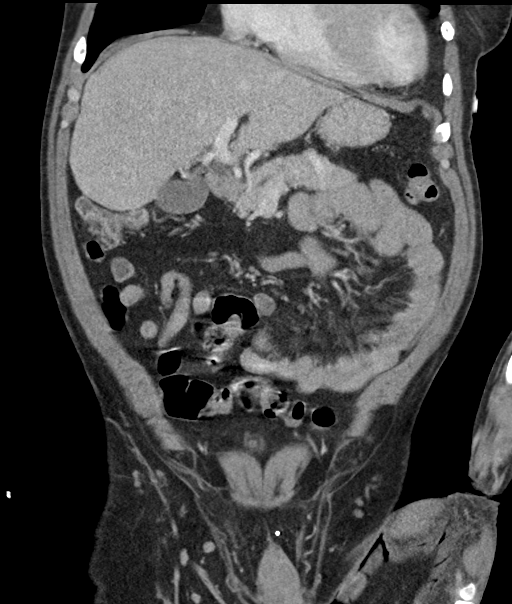
[im 41/93  soft-tissue]
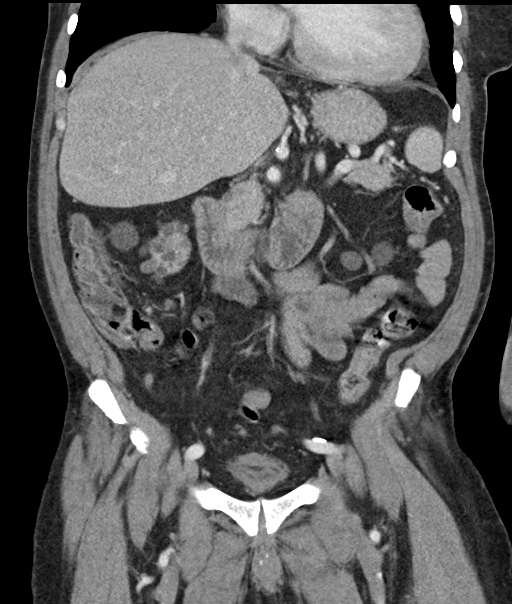
[im 52/93  soft-tissue]
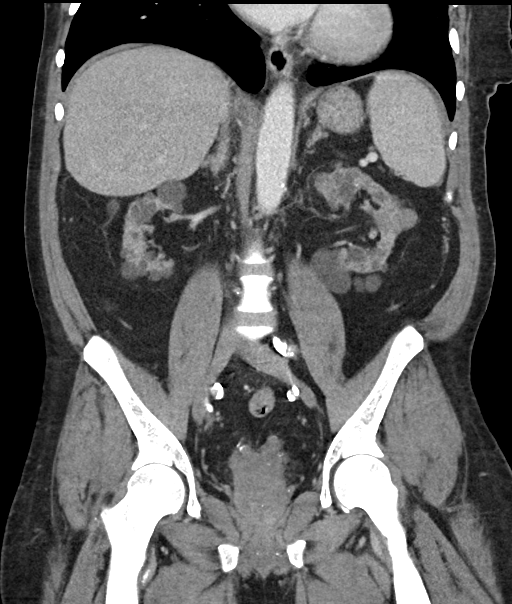

[16 of 46 positions shown; findings below may reference images not displayed]

FINDINGS: Lower chest: Patchy infiltration in the left lung base, possibly
early pneumonia. Left lung base air cyst. Cardiac enlargement.

Hepatobiliary: No focal liver abnormality is seen. No gallstones,
gallbladder wall thickening, or biliary dilatation.

Pancreas: Unremarkable. No pancreatic ductal dilatation or
surrounding inflammatory changes.

Spleen: Normal in size without focal abnormality.

Adrenals/Urinary Tract: No adrenal gland nodules. Numerous cysts in
both kidneys consistent with polycystic renal disease. Scattered
calcifications likely representing calcifications in the cyst walls.
No hydronephrosis or hydroureter. Bladder is decompressed.

Stomach/Bowel: Stomach is within normal limits. Appendix appears
normal. No evidence of bowel wall thickening, distention, or
inflammatory changes.

Vascular/Lymphatic: Aortic atherosclerosis. No enlarged abdominal or
pelvic lymph nodes.

Reproductive: Prostate gland is enlarged.

Other: No abdominal wall hernia or abnormality. No abdominopelvic
ascites.

Musculoskeletal: Mild diffuse bone sclerosis likely representing
renal osteodystrophy. No focal lesions.
IMPRESSION: 1. Patchy infiltration in the left lung base suggest early
pneumonia.
2. Polycystic renal disease.  No hydronephrosis or hydroureter.
3. Prostate gland is enlarged.
4. Aortic atherosclerosis.
5. Mild diffuse bone sclerosis suggesting renal osteodystrophy.

## 2022-08-05 ENCOUNTER — Ambulatory Visit (INDEPENDENT_AMBULATORY_CARE_PROVIDER_SITE_OTHER): Payer: 59 | Admitting: *Deleted

## 2022-08-05 DIAGNOSIS — Z0000 Encounter for general adult medical examination without abnormal findings: Secondary | ICD-10-CM | POA: Diagnosis not present

## 2022-08-05 NOTE — Progress Notes (Cosign Needed)
Subjective:   James Wall is a 57 y.o. male who presents for Medicare Annual/Subsequent preventive examination.  Visit Complete: Virtual  I connected with  [name] on 08/05/22 by a audio enabled telemedicine application and verified that I am speaking with the correct person using two identifiers.  Patient Location: Home  Provider Location: Home Office  I discussed the limitations of evaluation and management by telemedicine. The patient expressed understanding and agreed to proceed.    Review of Systems     Cardiac Risk Factors include: advanced age (>67men, >29 women);male gender;smoking/ tobacco exposure;hypertension     Objective:    Today's Vitals   There is no height or weight on file to calculate BMI.     08/05/2022    9:05 AM 06/17/2022    9:34 AM 10/22/2021   10:31 AM 10/16/2021   10:30 AM 09/24/2021    1:43 PM 09/16/2021    6:18 PM 09/14/2021   12:34 PM  Advanced Directives  Does Patient Have a Medical Advance Directive? No No No No No No No  Does patient want to make changes to medical advance directive?    Yes (ED - Information included in AVS)     Would patient like information on creating a medical advance directive? No - Patient declined No - Patient declined    No - Patient declined No - Patient declined    Current Medications (verified) Outpatient Encounter Medications as of 08/05/2022  Medication Sig   acetaminophen (TYLENOL) 325 MG tablet Take 2 tablets (650 mg total) by mouth every 4 (four) hours as needed for moderate pain or mild pain.   amLODipine (NORVASC) 10 MG tablet Take 1 tablet (10 mg total) by mouth at bedtime.   cloNIDine (CATAPRES - DOSED IN MG/24 HR) 0.3 mg/24hr patch Place 1 patch (0.3 mg total) onto the skin once a week on Sunday.   clopidogrel (PLAVIX) 75 MG tablet Take 1 tablet (75 mg total) by mouth daily.   hydrALAZINE (APRESOLINE) 50 MG tablet Take 1 tablet (50 mg total) by mouth 2 (two) times daily.   isosorbide  dinitrate (ISORDIL) 20 MG tablet Take 1 tablet (20 mg total) by mouth 2 (two) times daily.   latanoprost (XALATAN) 0.005 % ophthalmic solution Apply to eye.   metoprolol tartrate (LOPRESSOR) 100 MG tablet Take 1 tablet (100 mg total) by mouth 2 (two) times daily.   oxybutynin (DITROPAN) 5 MG tablet Take 5 mg by mouth 2 (two) times daily as needed for bladder spasms.   pantoprazole (PROTONIX) 20 MG tablet TAKE 1 TABLET(20 MG) BY MOUTH DAILY   rosuvastatin (CRESTOR) 10 MG tablet Take 1 tablet (10 mg total) by mouth daily.   SENSIPAR 90 MG tablet Take 90 mg by mouth daily.   sevelamer carbonate (RENVELA) 800 MG tablet Take 4 tablets (3,200 mg total) by mouth 3 (three) times daily with meals.   tamsulosin (FLOMAX) 0.4 MG CAPS capsule TAKE 2 CAPSULES(0.8 MG) BY MOUTH DAILY (Patient taking differently: Take 0.4 mg by mouth daily.)   No facility-administered encounter medications on file as of 08/05/2022.    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 [facility])    primary cancer is prostate   Anemia associated with chronic renal failure    Anxiety    Arthritis    CHF (congestive heart failure) (HCC)    Cirrhosis, nonalcoholic (HCC)  per pt possible from a medication , unsure ;   last ultrasound 08-09-2020 in epic no fibrosis   COPD (chronic obstructive pulmonary disease) (HCC)    Depression    ESRD on hemodialysis (HCC) 2009   Started HD Jan 2009;  ESRD secondary to hypertensive nephrosclerosis;  dialysis at [facility] on MWF   First degree heart block    GERD (gastroesophageal reflux disease)    Hiatal hernia    History of acute respiratory failure 07/2013   admission;  HCAP w/ ARF with hypoxia   History of adenomatous polyp of colon    History of ascites    s/p paracentesis 01-31-2013 (5L) and last one 03-28-2013 (2.7L)   History of  community acquired pneumonia 08/08/2020   admission ; LLL , POA   History of heart murmur in childhood    History of MRSA infection 12/19/2012   hospital admission;  w/ sepsis MRSA bacteramia AVF infection   History of sepsis 03/2017   admission;   HCAP w/ sepsis   Hyperlipidemia    Hypertension    Hypertensive heart disease    cardiologist--- dr Merlyn Lot;  nuclear stress test 06-16-2013 intermediate risk w/ mid-distal anterior wall ischemia , not gated;  cardiac cath 07-13-2013 in epic showed normal coronaries and LVSF,  ef not assessed, LCEDP   Hypertensive nephrosclerosis, 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)    Intolerance to cold    due to anemia   Malignant neoplasm prostate [facility])    urologist--- dr bell/  radiation onologist--- dr Kathrynn Running;  dx 01/ 2023,  Gleason 4+3, PSA 32   NICM (nonischemic cardiomyopathy) (HCC)    followed by cardiology;   last echo in epic 09-13-2020 ef 55-60%   OSA (obstructive sleep apnea) 2009   no  longer using cpap since the yr started 2009;   sleep study in epic 05-11-2007 severe osa   Pneumonia    PSVT (paroxysmal supraventricular tachycardia)    event monitor 08-01-2019  SR w/ SVT runs , rare PAC/ PVC   Secondary hyperparathyroidism (HCC)    Stroke (HCC)    2023 weakness Lhand and Left leg   Wears glasses    Past Surgical History:  Procedure Laterality Date   AV FISTULA PLACEMENT Right 02/22/2013   Procedure:  CREATION  OF BRACHIAL CEPHALIC FISTULA RIGHT ARM;  Surgeon: Sherren Kerns, MD;  Location: [facility] OR;  Service: Vascular;  Laterality: Right;   AV FISTULA PLACEMENT Left 08/10/2014   Procedure: BASILIC VEIN TRANSPOSITION  ARTERIOVENOUS (AV) FISTULA CREATION LEFT UPPER ARM;  Surgeon: Pryor Ochoa, MD;  Location: [facility] OR;  Service: Vascular;  Laterality: Left;   AV FISTULA PLACEMENT, RADIOCEPHALIC Left 02/22/2007   @[facility] ;  Left lower arm   COLONOSCOPY  11/30/2018   by dr beavers   CYSTOSCOPY W/  RETROGRADES Bilateral 06/26/2022   Procedure: CYSTOSCOPY WITH BILATERAL RETROGRADE PYELOGRAM;  Surgeon: Crista Elliot, MD;  Location: [facility] ORS;  Service: Urology;  Laterality: Bilateral;   ESOPHAGOGASTRODUODENOSCOPY (EGD) WITH PROPOFOL N/A 04/12/2013   Procedure: ESOPHAGOGASTRODUODENOSCOPY (EGD) WITH PROPOFOL;  Surgeon: Willis Modena, MD;  Location: [facility] ENDOSCOPY;  Service: Endoscopy;  Laterality: N/A;   EXCHANGE OF A DIALYSIS CATHETER Right 09/24/2021   Procedure: EXCHANGE OF A DIALYSIS CATHETER;  Surgeon: Chuck Hint, MD;  Location: [facility] OR;  Service: Vascular;  Laterality: Right;   GOLD SEED IMPLANT N/A 06/11/2021   Procedure: GOLD SEED IMPLANT;  Surgeon:  Jannifer Hick, MD;  Location: [facility] ORS;  Service: Urology;  Laterality: N/A;   INSERTION OF DIALYSIS CATHETER N/A 12/23/2012   Procedure: INSERTION OF DIALYSIS CATHETER; ULTRASOUND GUIDED;  Surgeon: Chuck Hint, MD;  Location: [facility] OR;  Service: Vascular;  Laterality: N/A;   INSERTION OF DIALYSIS CATHETER  10/22/2015   Right IJ non-tunneled HD catheter, placed again in 2019   IR FLUORO GUIDE CV LINE RIGHT  08/18/2019   IR FLUORO GUIDE CV LINE RIGHT  10/07/2019   IR GENERIC HISTORICAL  10/22/2015   IR US GUIDE VASC ACCESS RIGHT 10/22/2015 [facility]   IR GENERIC HISTORICAL  10/22/2015   IR FLUORO GUIDE CV LINE RIGHT 10/22/2015 [facility]   IR GENERIC HISTORICAL  10/23/2015   IR FLUORO GUIDE CV LINE RIGHT 10/23/2015 Jolaine Click, MD [facility]   LEFT HEART CATHETERIZATION WITH CORONARY ANGIOGRAM N/A 07/13/2013   Procedure: LEFT HEART CATHETERIZATION WITH CORONARY ANGIOGRAM;  Surgeon: Corky Crafts, MD;  Location: [facility] CATH LAB;  Service: Cardiovascular;  Laterality: N/A;   LIGATION OF ARTERIOVENOUS  FISTULA Left 12/22/2012   Procedure: LIGATION OF ARTERIOVENOUS  FISTULA;EXCISION OF LARGE ANEURYSMS;;  Surgeon: Sherren Kerns, MD;  Location: [facility] OR;  Service: Vascular;  Laterality: Left;   SPACE OAR INSTILLATION N/A  06/11/2021   Procedure: SPACE OAR INSTILLATION;  Surgeon: Jannifer Hick, MD;  Location: [facility] ORS;  Service: Urology;  Laterality: N/A;   UPPER GASTROINTESTINAL ENDOSCOPY  09/07/2018   by dr Orvan Falconer   Family History  Problem Relation Age of Onset   Hypertension Mother    Cerebrovascular Accident Father    Hypertension Father    Congestive Heart Failure Brother    Asthma Brother    Stomach cancer Neg Hx    Rectal cancer Neg Hx    Esophageal cancer Neg Hx    Colon cancer Neg Hx    Social History   Socioeconomic History   Marital status: Single    Spouse name: Not on file   Number of children: 0   Years of education: 12th   Highest education level: High school graduate  Occupational History   Occupation: n/a  Tobacco Use   Smoking status: Every Day    Packs/day: 0.50    Years: 17.00    Additional pack years: 0.00    Total pack years: 8.50    Types: Cigarettes   Smokeless tobacco: Never   Tobacco comments:    06-06-2021  per pt had quit smoking 06/ 2021 for 16 yrs , but restarted approx 10/ 2022 1pp 3-4 days  Vaping Use   Vaping Use: Never used  Substance and Sexual Activity   Alcohol use: Yes    Comment: Holidays has one drink   Drug use: Yes    Frequency: 4.0 times per week    Types: Marijuana    Comment: 06-06-2021 per pt smokes 2-4 weekly, (half of joint "blunt")   Sexual activity: Not Currently  Other Topics Concern   Not on file  Social History Narrative   Patient lives alone in [place].    Patient has never been married and does not have any children.    Patients support system, his mother, pass away 2016.   Patient does not own his own vehicle, uses public transportation with no concerns at this time.   Social Determinants of Health   Financial Resource Strain: Low Risk  (08/05/2022)   Overall Financial Resource Strain (CARDIA)    Difficulty of Paying Living Expenses:  Not hard at all  Food Insecurity: No Food Insecurity (08/05/2022)   Hunger Vital Sign     Worried About Running Out of Food in the Last Year: Never true    Ran Out of Food in the Last Year: Never true  Transportation Needs: Unmet Transportation Needs (08/05/2022)   PRAPARE - Transportation    Lack of Transportation (Medical): Yes    Lack of Transportation (Non-Medical): Yes  Physical Activity: Inactive (08/05/2022)   Exercise Vital Sign    Days of Exercise per Week: 0 days    Minutes of Exercise per Session: 0 min  Stress: No Stress Concern Present (08/05/2022)   [employer] of Occupational Health - Occupational Stress Questionnaire    Feeling of Stress : Not at all  Social Connections: Moderately Isolated (08/05/2022)   Social Connection and Isolation Panel [NHANES]    Frequency of Communication with Friends and Family: Once a week    Frequency of Social Gatherings with Friends and Family: Three times a week    Attends Religious Services: 1 to 4 times per year    Active Member of Clubs or Organizations: No    Attends Banker Meetings: Never    Marital Status: Never married    Tobacco Counseling Ready to quit: Not Answered Counseling given: Not Answered Tobacco comments: 06-06-2021  per pt had quit smoking 06/ 2021 for 16 yrs , but restarted approx 10/ 2022 1pp 3-4 days   Clinical Intake:  Pre-visit preparation completed: Yes  Pain : No/denies pain     Diabetes: No  How often do you need to have someone help you when you read instructions, pamphlets, or other written materials from your doctor or pharmacy?: 1 - Never  Interpreter Needed?: No  Information entered by :: Remi Haggard LPN   Activities of Daily Living    08/05/2022    9:05 AM 06/17/2022    9:37 AM  In your present state of health, do you have any difficulty performing the following activities:  Hearing? 0   Difficulty concentrating or making decisions? 0   Walking or climbing stairs? 0   Dressing or bathing? 0   Doing errands, shopping? 0 0  Preparing Food and eating ? N   Using  the Toilet? N   In the past six months, have you accidently leaked urine? N   Do you have problems with loss of bowel control? N   Managing your Medications? N   Managing your Finances? N   Housekeeping or managing your Housekeeping? N     Patient Care Team: Shelby Mattocks, DO as PCP - General (Family Medicine) Christell Constant, MD as PCP - Cardiology (Cardiology) Deterding, Fayrene Fearing, MD (Nephrology) Elvis Coil, MD as Consulting Physician (Nephrology) Ginette Otto, [facility] Raiford Noble, [place], RN as [facility] Care Management Alvester Morin, Jannett Celestine, MD as Consulting Physician (Urology) Margaretmary Dys, MD as Consulting Physician (Radiation Oncology) Axel Filler Larna Daughters, NP as Nurse Practitioner (Hematology and Oncology) Maryclare Labrador, RN as Registered Nurse  Indicate any recent Medical Services you may have received from other than [facility] providers in the past year (date may be approximate).     Assessment:   This is a routine wellness examination for Trip.  Hearing/Vision screen Hearing Screening - Comments:: No trouble hearing Vision Screening - Comments:: Up to date [facility]  Dietary issues and exercise activities discussed:     Goals Addressed  This Visit's Progress    Patient Stated       No goals       Depression Screen    08/05/2022    9:13 AM 10/22/2021   10:31 AM 09/05/2020    1:56 PM 08/01/2020    2:49 PM 06/05/2020    8:59 AM 09/20/2019    8:34 AM 09/15/2017   11:41 AM  PHQ 2/9 Scores  PHQ - 2 Score 5 2 2 3 3 4 4   PHQ- 9 Score 7 11 12 12 12 8 16     Fall Risk    08/05/2022    9:04 AM 06/04/2021    1:05 PM 08/01/2020    2:55 PM 06/05/2020    8:59 AM 02/15/2013    9:53 AM  Fall Risk   Falls in the past year? 0 1 0 0 No  Number falls in past yr: 0 0  0   Injury with Fall? 0 0  0   Comment  Bruised knee     Risk for fall due to :  Impaired balance/gait No Fall Risks    Follow up Falls evaluation  completed;Education provided;Falls prevention discussed Falls evaluation completed;Education provided;Falls prevention discussed       MEDICARE RISK AT HOME:  Medicare Risk at Home - 08/05/22 0904     Any stairs in or around the home? No    If so, are there any without handrails? No    Adequate lighting in your home to reduce risk of falls? Yes    Life alert? Yes    Use of a cane, walker or w/c? No    Grab bars in the bathroom? No    Shower chair or bench in shower? No    Elevated toilet seat or a handicapped toilet? No             TIMED UP AND GO:  Was the test performed?  No    Cognitive Function:        08/05/2022    9:08 AM 08/01/2020    2:56 PM  6CIT Screen  What Year? 0 points 0 points  What month? 0 points 0 points  What time? 0 points 0 points  Count back from 20 0 points 0 points  Months in reverse 0 points 0 points  Repeat phrase 0 points 0 points  Total Score 0 points 0 points    Immunizations Immunization History  Administered Date(s) Administered   Influenza,inj,Quad PF,6+ Mos 11/16/2020   PFIZER(Purple Top)SARS-COV-2 Vaccination 09/14/2019, 10/21/2019    TDAP status: Due, Education has been provided regarding the importance of this vaccine. Advised may receive this vaccine at local pharmacy or Health Dept. Aware to provide a copy of the vaccination record if obtained from local pharmacy or Health Dept. Verbalized acceptance and understanding.  Flu Vaccine status: Up to date  Pneumococcal vaccine status: Due, Education has been provided regarding the importance of this vaccine. Advised may receive this vaccine at local pharmacy or Health Dept. Aware to provide a copy of the vaccination record if obtained from local pharmacy or Health Dept. Verbalized acceptance and understanding.  Covid-19 vaccine status: Information provided on how to obtain vaccines.   Qualifies for Shingles Vaccine? Yes   Zostavax completed No   Shingrix Completed?: No.     Education has been provided regarding the importance of this vaccine. Patient has been advised to call insurance company to determine out of pocket expense if they have not yet received this vaccine. Advised  may also receive vaccine at local pharmacy or Health Dept. Verbalized acceptance and understanding.  Screening Tests Health Maintenance  Topic Date Due   Zoster Vaccines- Shingrix (1 of 2) Never done   HEMOGLOBIN A1C  03/17/2022   COVID-19 Vaccine (3 - Pfizer risk series) 08/21/2022 (Originally 11/18/2019)   INFLUENZA VACCINE  09/04/2022   Medicare Annual Wellness (AWV)  08/05/2023   Colonoscopy  11/29/2025   Hepatitis C Screening  Completed   HIV Screening  Completed   HPV VACCINES  Aged Out   DTaP/Tdap/Td  Discontinued   FOOT EXAM  Discontinued   OPHTHALMOLOGY EXAM  Discontinued    Health Maintenance  Health Maintenance Due  Topic Date Due   Zoster Vaccines- Shingrix (1 of 2) Never done   HEMOGLOBIN A1C  03/17/2022    Colorectal cancer screening: Type of screening: Colonoscopy. Completed 2020. Repeat every 7 years  Lung Cancer Screening: (Low Dose CT Chest recommended if Age 75-80 years, 20 pack-year currently smoking OR have quit w/in 15years.)does not qualify.   Lung Cancer Screening Referral:   Additional Screening:  Hepatitis C Screening: does not qualify; Completed 2023  Vision Screening: Recommended annual ophthalmology exams for early detection of glaucoma and other disorders of the eye. Is the patient up to date with their annual eye exam?  Yes  Who is the provider or what is the name of the office in which the patient attends annual eye exams? [facility] If pt is not established with a provider, would they like to be referred to a provider to establish care? No .   Dental Screening: Recommended annual dental exams for proper oral hygiene  Diabetic Foot Exam:   Community Resource Referral / Chronic Care Management: CRR required this visit?  No   CCM  required this visit?  No     Plan:     I have personally reviewed and noted the following in the patient's chart:   Medical and social history Use of alcohol, tobacco or illicit drugs  Current medications and supplements including opioid prescriptions. Patient is not currently taking opioid prescriptions. Functional ability and status Nutritional status Physical activity Advanced directives List of other physicians Hospitalizations, surgeries, and ER visits in previous 12 months Vitals Screenings to include cognitive, depression, and falls Referrals and appointments  In addition, I have reviewed and discussed with patient certain preventive protocols, quality metrics, and best practice recommendations. A written personalized care plan for preventive services as well as general preventive health recommendations were provided to patient.     Remi Haggard, LPN   02/12/1476   After Visit Summary: (MyChart) Due to this being a telephonic visit, the after visit summary with patients personalized plan was offered to patient via MyChart   Nurse Notes:

## 2022-08-05 NOTE — Patient Instructions (Signed)
James Wall , Thank you for taking time to come for your Medicare Wellness Visit. I appreciate your ongoing commitment to your health goals. Please review the following plan we discussed and let me know if I can assist you in the future.   Screening recommendations/referrals: Colonoscopy: up to date Recommended yearly ophthalmology/optometry visit for glaucoma screening and checkup Recommended yearly dental visit for hygiene and checkup  Vaccinations: Influenza vaccine: up to date  Tdap vaccine: Education provided Shingles vaccine: Education provided    Advanced directives: Education provided   Preventive Care 40-64 Years, Male Preventive care refers to lifestyle choices and visits with your health care provider that can promote health and wellness. What does preventive care include? A yearly physical exam. This is also called an annual well check. Dental exams once or twice a year. Routine eye exams. Ask your health care provider how often you should have your eyes checked. Personal lifestyle choices, including: Daily care of your teeth and gums. Regular physical activity. Eating a healthy diet. Avoiding tobacco and drug use. Limiting alcohol use. Practicing safe sex. Taking low-dose aspirin every day starting at age 8. What happens during an annual well check? The services and screenings done by your health care provider during your annual well check will depend on your age, overall health, lifestyle risk factors, and family history of disease. Counseling  Your health care provider may ask you questions about your: Alcohol use. Tobacco use. Drug use. Emotional well-being. Home and relationship well-being. Sexual activity. Eating habits. Work and work Astronomer. Screening  You may have the following tests or measurements: Height, weight, and BMI. Blood pressure. Lipid and cholesterol levels. These may be checked every 5 years, or more frequently if you are over 73  years old. Skin check. Lung cancer screening. You may have this screening every year starting at age 55 if you have a 30-pack-year history of smoking and currently smoke or have quit within the past 15 years. Fecal occult blood test (FOBT) of the stool. You may have this test every year starting at age 61. Flexible sigmoidoscopy or colonoscopy. You may have a sigmoidoscopy every 5 years or a colonoscopy every 10 years starting at age 79. Prostate cancer screening. Recommendations will vary depending on your family history and other risks. Hepatitis C blood test. Hepatitis B blood test. Sexually transmitted disease (STD) testing. Diabetes screening. This is done by checking your blood sugar (glucose) after you have not eaten for a while (fasting). You may have this done every 1-3 years. Discuss your test results, treatment options, and if necessary, the need for more tests with your health care provider. Vaccines  Your health care provider may recommend certain vaccines, such as: Influenza vaccine. This is recommended every year. Tetanus, diphtheria, and acellular pertussis (Tdap, Td) vaccine. You may need a Td booster every 10 years. Zoster vaccine. You may need this after age 32. Pneumococcal 13-valent conjugate (PCV13) vaccine. You may need this if you have certain conditions and have not been vaccinated. Pneumococcal polysaccharide (PPSV23) vaccine. You may need one or two doses if you smoke cigarettes or if you have certain conditions. Talk to your health care provider about which screenings and vaccines you need and how often you need them. This information is not intended to replace advice given to you by your health care provider. Make sure you discuss any questions you have with your health care provider. Document Released: 02/16/2015 Document Revised: 10/10/2015 Document Reviewed: 11/21/2014 Elsevier Interactive Patient Education  2017  [place] Prevention in the  Home Falls can cause injuries. They can happen to people of all ages. There are many things you can do to make your home safe and to help prevent falls. What can I do on the outside of my home? Regularly fix the edges of walkways and driveways and fix any cracks. Remove anything that might make you trip as you walk through a door, such as a raised step or threshold. Trim any bushes or trees on the path to your home. Use bright outdoor lighting. Clear any walking paths of anything that might make someone trip, such as rocks or tools. Regularly check to see if handrails are loose or broken. Make sure that both sides of any steps have handrails. Any raised decks and porches should have guardrails on the edges. Have any leaves, snow, or ice cleared regularly. Use sand or salt on walking paths during winter. Clean up any spills in your garage right away. This includes oil or grease spills. What can I do in the bathroom? Use night lights. Install grab bars by the toilet and in the tub and shower. Do not use towel bars as grab bars. Use non-skid mats or decals in the tub or shower. If you need to sit down in the shower, use a plastic, non-slip stool. Keep the floor dry. Clean up any water that spills on the floor as soon as it happens. Remove soap buildup in the tub or shower regularly. Attach bath mats securely with double-sided non-slip rug tape. Do not have throw rugs and other things on the floor that can make you trip. What can I do in the bedroom? Use night lights. Make sure that you have a light by your bed that is easy to reach. Do not use any sheets or blankets that are too big for your bed. They should not hang down onto the floor. Have a firm chair that has side arms. You can use this for support while you get dressed. Do not have throw rugs and other things on the floor that can make you trip. What can I do in the kitchen? Clean up any spills right away. Avoid walking on wet  floors. Keep items that you use a lot in easy-to-reach places. If you need to reach something above you, use a strong step stool that has a grab bar. Keep electrical cords out of the way. Do not use floor polish or wax that makes floors slippery. If you must use wax, use non-skid floor wax. Do not have throw rugs and other things on the floor that can make you trip. What can I do with my stairs? Do not leave any items on the stairs. Make sure that there are handrails on both sides of the stairs and use them. Fix handrails that are broken or loose. Make sure that handrails are as long as the stairways. Check any carpeting to make sure that it is firmly attached to the stairs. Fix any carpet that is loose or worn. Avoid having throw rugs at the top or bottom of the stairs. If you do have throw rugs, attach them to the floor with carpet tape. Make sure that you have a light switch at the top of the stairs and the bottom of the stairs. If you do not have them, ask someone to add them for you. What else can I do to help prevent falls? Wear shoes that: Do not have high heels. Have rubber bottoms. Are  comfortable and fit you well. Are closed at the toe. Do not wear sandals. If you use a stepladder: Make sure that it is fully opened. Do not climb a closed stepladder. Make sure that both sides of the stepladder are locked into place. Ask someone to hold it for you, if possible. Clearly mark and make sure that you can see: Any grab bars or handrails. First and last steps. Where the edge of each step is. Use tools that help you move around (mobility aids) if they are needed. These include: Canes. Walkers. Scooters. Crutches. Turn on the lights when you go into a dark area. Replace any light bulbs as soon as they burn out. Set up your furniture so you have a clear path. Avoid moving your furniture around. If any of your floors are uneven, fix them. If there are any pets around you, be aware of  where they are. Review your medicines with your doctor. Some medicines can make you feel dizzy. This can increase your chance of falling. Ask your doctor what other things that you can do to help prevent falls. This information is not intended to replace advice given to you by your health care provider. Make sure you discuss any questions you have with your health care provider. Document Released: 11/16/2008 Document Revised: 06/28/2015 Document Reviewed: 02/24/2014 Elsevier Interactive Patient Education  2017 [employer].

## 2022-08-21 ENCOUNTER — Telehealth: Payer: Self-pay

## 2022-08-21 NOTE — Patient Outreach (Signed)
  Care Coordination   08/21/2022 Name: James Wall MRN: [id number] DOB: May 12, 1965   Care Coordination Outreach Attempts:  An unsuccessful telephone outreach was attempted today to offer the patient information about available care coordination services.  Follow Up Plan:  Additional outreach attempts will be made to offer the patient care coordination information and services.   Encounter Outcome:  No Answer   Care Coordination Interventions:  No, not indicated    [name] RN, BSN, [facility] Care Coordinator [facility]   Phone: [phone]

## 2022-09-04 ENCOUNTER — Ambulatory Visit: Payer: Self-pay

## 2022-09-04 NOTE — Patient Outreach (Signed)
  Care Coordination   Follow Up Visit Note   09/04/2022 Name: James Wall MRN: [id number] DOB: 1965/10/26  James Wall is a 57 y.o. year old male who sees James Mattocks, DO for primary care. I spoke with  James Wall by phone today.  What matters to the patients health and wellness today?  James Wall said that he has been under stress at dialysis. He mentioned that he is not letting it get to him to avoid having another stroke. His blood pressure readings have been 120/80 and 120/97 before treatment, dropping to 82/67 after the session. This was low, increasing to 106/88 after he completed his therapy.     Goals Addressed             This Visit's Progress    I was just out of the hospital for stroke       Patient Goals/Self Care Activities: -Patient/Caregiver will self-administer medications as prescribed as evidenced by self-report/primary caregiver report  -Patient/Caregiver will attend all scheduled provider appointments as evidenced by clinician review of documented attendance to scheduled appointments and patient/caregiver report -Patient/Caregiver will call pharmacy for medication refills as evidenced by patient report and review of pharmacy fill history as appropriate -Patient/Caregiver will call provider office for new concerns or questions as evidenced by review of documented incoming telephone call notes and patient report --Calls provider office for new concerns, questions, or BP outside discussed parameters -Checks BP and record as discussed Assessed for signs and symptoms of stroke Active listening / Reflection utilized  Emotional Support Provided Problem Solving /Task Center strategies reviewed continue to use relaxation techniques to not get upset        SDOH assessments and interventions completed:  No     Care Coordination Interventions:  Yes, provided   Interventions Today    Flowsheet Row Most Recent Value  Chronic Disease    Chronic disease during today's visit Hypertension (HTN)  General Interventions   General Interventions Discussed/Reviewed General Interventions Reviewed  Safety Interventions   Safety Discussed/Reviewed Safety Reviewed        Follow up plan: Follow up call scheduled for 11/04/22  2 pm    Encounter Outcome:  Pt. Visit Completed   James Fairly RN, BSN, [facility] Care Coordinator [facility]   Phone: [phone]

## 2022-09-04 NOTE — Patient Instructions (Signed)
Visit Information  Thank you for taking time to visit with me today. Please don't hesitate to contact me if I can be of assistance to you.   Following are the goals we discussed today:   Goals Addressed   None     Our next appointment is by telephone on 11/04/22 at 2 pm  Please call the care guide team at [phone] if you need to cancel or reschedule your appointment.   If you are experiencing a Mental Health or Behavioral Health Crisis or need someone to talk to, please call 1-800-273-TALK (toll free, 24 hour hotline)  Patient verbalizes understanding of instructions and care plan provided today and agrees to view in MyChart. Active MyChart status and patient understanding of how to access instructions and care plan via MyChart confirmed with patient.     [name] RN, BSN, [facility] Care Coordinator [facility]   Phone: [phone]

## 2022-09-05 DIAGNOSIS — D631 Anemia in chronic kidney disease: Secondary | ICD-10-CM | POA: Diagnosis not present

## 2022-09-05 DIAGNOSIS — N2581 Secondary hyperparathyroidism of renal origin: Secondary | ICD-10-CM | POA: Diagnosis not present

## 2022-09-05 DIAGNOSIS — Z992 Dependence on renal dialysis: Secondary | ICD-10-CM | POA: Diagnosis not present

## 2022-09-05 DIAGNOSIS — R197 Diarrhea, unspecified: Secondary | ICD-10-CM | POA: Diagnosis not present

## 2022-09-05 DIAGNOSIS — D509 Iron deficiency anemia, unspecified: Secondary | ICD-10-CM | POA: Diagnosis not present

## 2022-09-05 DIAGNOSIS — N186 End stage renal disease: Secondary | ICD-10-CM | POA: Diagnosis not present

## 2022-09-05 DIAGNOSIS — D689 Coagulation defect, unspecified: Secondary | ICD-10-CM | POA: Diagnosis not present

## 2022-09-08 DIAGNOSIS — N2581 Secondary hyperparathyroidism of renal origin: Secondary | ICD-10-CM | POA: Diagnosis not present

## 2022-09-08 DIAGNOSIS — N186 End stage renal disease: Secondary | ICD-10-CM | POA: Diagnosis not present

## 2022-09-08 DIAGNOSIS — Z992 Dependence on renal dialysis: Secondary | ICD-10-CM | POA: Diagnosis not present

## 2022-09-08 DIAGNOSIS — D631 Anemia in chronic kidney disease: Secondary | ICD-10-CM | POA: Diagnosis not present

## 2022-09-08 DIAGNOSIS — R197 Diarrhea, unspecified: Secondary | ICD-10-CM | POA: Diagnosis not present

## 2022-09-08 DIAGNOSIS — D509 Iron deficiency anemia, unspecified: Secondary | ICD-10-CM | POA: Diagnosis not present

## 2022-09-08 DIAGNOSIS — D689 Coagulation defect, unspecified: Secondary | ICD-10-CM | POA: Diagnosis not present

## 2022-09-10 DIAGNOSIS — D689 Coagulation defect, unspecified: Secondary | ICD-10-CM | POA: Diagnosis not present

## 2022-09-10 DIAGNOSIS — R197 Diarrhea, unspecified: Secondary | ICD-10-CM | POA: Diagnosis not present

## 2022-09-10 DIAGNOSIS — N186 End stage renal disease: Secondary | ICD-10-CM | POA: Diagnosis not present

## 2022-09-10 DIAGNOSIS — N2581 Secondary hyperparathyroidism of renal origin: Secondary | ICD-10-CM | POA: Diagnosis not present

## 2022-09-10 DIAGNOSIS — Z992 Dependence on renal dialysis: Secondary | ICD-10-CM | POA: Diagnosis not present

## 2022-09-10 DIAGNOSIS — D509 Iron deficiency anemia, unspecified: Secondary | ICD-10-CM | POA: Diagnosis not present

## 2022-09-10 DIAGNOSIS — D631 Anemia in chronic kidney disease: Secondary | ICD-10-CM | POA: Diagnosis not present

## 2022-09-12 DIAGNOSIS — N186 End stage renal disease: Secondary | ICD-10-CM | POA: Diagnosis not present

## 2022-09-12 DIAGNOSIS — R197 Diarrhea, unspecified: Secondary | ICD-10-CM | POA: Diagnosis not present

## 2022-09-12 DIAGNOSIS — D689 Coagulation defect, unspecified: Secondary | ICD-10-CM | POA: Diagnosis not present

## 2022-09-12 DIAGNOSIS — Z992 Dependence on renal dialysis: Secondary | ICD-10-CM | POA: Diagnosis not present

## 2022-09-12 DIAGNOSIS — D509 Iron deficiency anemia, unspecified: Secondary | ICD-10-CM | POA: Diagnosis not present

## 2022-09-12 DIAGNOSIS — N2581 Secondary hyperparathyroidism of renal origin: Secondary | ICD-10-CM | POA: Diagnosis not present

## 2022-09-12 DIAGNOSIS — D631 Anemia in chronic kidney disease: Secondary | ICD-10-CM | POA: Diagnosis not present

## 2022-09-15 DIAGNOSIS — Z992 Dependence on renal dialysis: Secondary | ICD-10-CM | POA: Diagnosis not present

## 2022-09-15 DIAGNOSIS — D689 Coagulation defect, unspecified: Secondary | ICD-10-CM | POA: Diagnosis not present

## 2022-09-15 DIAGNOSIS — N2581 Secondary hyperparathyroidism of renal origin: Secondary | ICD-10-CM | POA: Diagnosis not present

## 2022-09-15 DIAGNOSIS — D631 Anemia in chronic kidney disease: Secondary | ICD-10-CM | POA: Diagnosis not present

## 2022-09-15 DIAGNOSIS — R197 Diarrhea, unspecified: Secondary | ICD-10-CM | POA: Diagnosis not present

## 2022-09-15 DIAGNOSIS — D509 Iron deficiency anemia, unspecified: Secondary | ICD-10-CM | POA: Diagnosis not present

## 2022-09-15 DIAGNOSIS — N186 End stage renal disease: Secondary | ICD-10-CM | POA: Diagnosis not present

## 2022-09-17 DIAGNOSIS — N186 End stage renal disease: Secondary | ICD-10-CM | POA: Diagnosis not present

## 2022-09-17 DIAGNOSIS — D509 Iron deficiency anemia, unspecified: Secondary | ICD-10-CM | POA: Diagnosis not present

## 2022-09-17 DIAGNOSIS — Z992 Dependence on renal dialysis: Secondary | ICD-10-CM | POA: Diagnosis not present

## 2022-09-17 DIAGNOSIS — D689 Coagulation defect, unspecified: Secondary | ICD-10-CM | POA: Diagnosis not present

## 2022-09-17 DIAGNOSIS — R197 Diarrhea, unspecified: Secondary | ICD-10-CM | POA: Diagnosis not present

## 2022-09-17 DIAGNOSIS — D631 Anemia in chronic kidney disease: Secondary | ICD-10-CM | POA: Diagnosis not present

## 2022-09-17 DIAGNOSIS — N2581 Secondary hyperparathyroidism of renal origin: Secondary | ICD-10-CM | POA: Diagnosis not present

## 2022-09-19 DIAGNOSIS — D631 Anemia in chronic kidney disease: Secondary | ICD-10-CM | POA: Diagnosis not present

## 2022-09-19 DIAGNOSIS — D689 Coagulation defect, unspecified: Secondary | ICD-10-CM | POA: Diagnosis not present

## 2022-09-19 DIAGNOSIS — N186 End stage renal disease: Secondary | ICD-10-CM | POA: Diagnosis not present

## 2022-09-19 DIAGNOSIS — R197 Diarrhea, unspecified: Secondary | ICD-10-CM | POA: Diagnosis not present

## 2022-09-19 DIAGNOSIS — N2581 Secondary hyperparathyroidism of renal origin: Secondary | ICD-10-CM | POA: Diagnosis not present

## 2022-09-19 DIAGNOSIS — D509 Iron deficiency anemia, unspecified: Secondary | ICD-10-CM | POA: Diagnosis not present

## 2022-09-19 DIAGNOSIS — Z992 Dependence on renal dialysis: Secondary | ICD-10-CM | POA: Diagnosis not present

## 2022-09-22 DIAGNOSIS — R197 Diarrhea, unspecified: Secondary | ICD-10-CM | POA: Diagnosis not present

## 2022-09-22 DIAGNOSIS — D631 Anemia in chronic kidney disease: Secondary | ICD-10-CM | POA: Diagnosis not present

## 2022-09-22 DIAGNOSIS — N2581 Secondary hyperparathyroidism of renal origin: Secondary | ICD-10-CM | POA: Diagnosis not present

## 2022-09-22 DIAGNOSIS — D689 Coagulation defect, unspecified: Secondary | ICD-10-CM | POA: Diagnosis not present

## 2022-09-22 DIAGNOSIS — Z992 Dependence on renal dialysis: Secondary | ICD-10-CM | POA: Diagnosis not present

## 2022-09-22 DIAGNOSIS — D509 Iron deficiency anemia, unspecified: Secondary | ICD-10-CM | POA: Diagnosis not present

## 2022-09-22 DIAGNOSIS — N186 End stage renal disease: Secondary | ICD-10-CM | POA: Diagnosis not present

## 2022-09-24 DIAGNOSIS — Z992 Dependence on renal dialysis: Secondary | ICD-10-CM | POA: Diagnosis not present

## 2022-09-24 DIAGNOSIS — D509 Iron deficiency anemia, unspecified: Secondary | ICD-10-CM | POA: Diagnosis not present

## 2022-09-24 DIAGNOSIS — N2581 Secondary hyperparathyroidism of renal origin: Secondary | ICD-10-CM | POA: Diagnosis not present

## 2022-09-24 DIAGNOSIS — R197 Diarrhea, unspecified: Secondary | ICD-10-CM | POA: Diagnosis not present

## 2022-09-24 DIAGNOSIS — D689 Coagulation defect, unspecified: Secondary | ICD-10-CM | POA: Diagnosis not present

## 2022-09-24 DIAGNOSIS — D631 Anemia in chronic kidney disease: Secondary | ICD-10-CM | POA: Diagnosis not present

## 2022-09-24 DIAGNOSIS — N186 End stage renal disease: Secondary | ICD-10-CM | POA: Diagnosis not present

## 2022-09-26 DIAGNOSIS — N186 End stage renal disease: Secondary | ICD-10-CM | POA: Diagnosis not present

## 2022-09-26 DIAGNOSIS — D509 Iron deficiency anemia, unspecified: Secondary | ICD-10-CM | POA: Diagnosis not present

## 2022-09-26 DIAGNOSIS — N2581 Secondary hyperparathyroidism of renal origin: Secondary | ICD-10-CM | POA: Diagnosis not present

## 2022-09-26 DIAGNOSIS — R197 Diarrhea, unspecified: Secondary | ICD-10-CM | POA: Diagnosis not present

## 2022-09-26 DIAGNOSIS — D689 Coagulation defect, unspecified: Secondary | ICD-10-CM | POA: Diagnosis not present

## 2022-09-26 DIAGNOSIS — Z992 Dependence on renal dialysis: Secondary | ICD-10-CM | POA: Diagnosis not present

## 2022-09-26 DIAGNOSIS — D631 Anemia in chronic kidney disease: Secondary | ICD-10-CM | POA: Diagnosis not present

## 2022-09-29 DIAGNOSIS — N186 End stage renal disease: Secondary | ICD-10-CM | POA: Diagnosis not present

## 2022-09-29 DIAGNOSIS — D509 Iron deficiency anemia, unspecified: Secondary | ICD-10-CM | POA: Diagnosis not present

## 2022-09-29 DIAGNOSIS — D689 Coagulation defect, unspecified: Secondary | ICD-10-CM | POA: Diagnosis not present

## 2022-09-29 DIAGNOSIS — N2581 Secondary hyperparathyroidism of renal origin: Secondary | ICD-10-CM | POA: Diagnosis not present

## 2022-09-29 DIAGNOSIS — Z992 Dependence on renal dialysis: Secondary | ICD-10-CM | POA: Diagnosis not present

## 2022-09-29 DIAGNOSIS — D631 Anemia in chronic kidney disease: Secondary | ICD-10-CM | POA: Diagnosis not present

## 2022-09-29 DIAGNOSIS — R197 Diarrhea, unspecified: Secondary | ICD-10-CM | POA: Diagnosis not present

## 2022-10-01 DIAGNOSIS — R197 Diarrhea, unspecified: Secondary | ICD-10-CM | POA: Diagnosis not present

## 2022-10-01 DIAGNOSIS — Z992 Dependence on renal dialysis: Secondary | ICD-10-CM | POA: Diagnosis not present

## 2022-10-01 DIAGNOSIS — N2581 Secondary hyperparathyroidism of renal origin: Secondary | ICD-10-CM | POA: Diagnosis not present

## 2022-10-01 DIAGNOSIS — N186 End stage renal disease: Secondary | ICD-10-CM | POA: Diagnosis not present

## 2022-10-01 DIAGNOSIS — D509 Iron deficiency anemia, unspecified: Secondary | ICD-10-CM | POA: Diagnosis not present

## 2022-10-01 DIAGNOSIS — D631 Anemia in chronic kidney disease: Secondary | ICD-10-CM | POA: Diagnosis not present

## 2022-10-01 DIAGNOSIS — D689 Coagulation defect, unspecified: Secondary | ICD-10-CM | POA: Diagnosis not present

## 2022-10-03 DIAGNOSIS — N2581 Secondary hyperparathyroidism of renal origin: Secondary | ICD-10-CM | POA: Diagnosis not present

## 2022-10-03 DIAGNOSIS — N186 End stage renal disease: Secondary | ICD-10-CM | POA: Diagnosis not present

## 2022-10-03 DIAGNOSIS — D509 Iron deficiency anemia, unspecified: Secondary | ICD-10-CM | POA: Diagnosis not present

## 2022-10-03 DIAGNOSIS — R197 Diarrhea, unspecified: Secondary | ICD-10-CM | POA: Diagnosis not present

## 2022-10-03 DIAGNOSIS — Z992 Dependence on renal dialysis: Secondary | ICD-10-CM | POA: Diagnosis not present

## 2022-10-03 DIAGNOSIS — D689 Coagulation defect, unspecified: Secondary | ICD-10-CM | POA: Diagnosis not present

## 2022-10-03 DIAGNOSIS — D631 Anemia in chronic kidney disease: Secondary | ICD-10-CM | POA: Diagnosis not present

## 2022-10-04 DIAGNOSIS — I12 Hypertensive chronic kidney disease with 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: Secondary | ICD-10-CM | POA: Diagnosis not present

## 2022-10-04 DIAGNOSIS — N186 End stage renal disease: Secondary | ICD-10-CM | POA: Diagnosis not present

## 2022-10-04 DIAGNOSIS — Z992 Dependence on renal dialysis: Secondary | ICD-10-CM | POA: Diagnosis not present

## 2022-10-06 DIAGNOSIS — N186 End stage renal disease: Secondary | ICD-10-CM | POA: Diagnosis not present

## 2022-10-06 DIAGNOSIS — D631 Anemia in chronic kidney disease: Secondary | ICD-10-CM | POA: Diagnosis not present

## 2022-10-06 DIAGNOSIS — D509 Iron deficiency anemia, unspecified: Secondary | ICD-10-CM | POA: Diagnosis not present

## 2022-10-06 DIAGNOSIS — N2581 Secondary hyperparathyroidism of renal origin: Secondary | ICD-10-CM | POA: Diagnosis not present

## 2022-10-06 DIAGNOSIS — R197 Diarrhea, unspecified: Secondary | ICD-10-CM | POA: Diagnosis not present

## 2022-10-06 DIAGNOSIS — D689 Coagulation defect, unspecified: Secondary | ICD-10-CM | POA: Diagnosis not present

## 2022-10-06 DIAGNOSIS — Z992 Dependence on renal dialysis: Secondary | ICD-10-CM | POA: Diagnosis not present

## 2022-10-08 DIAGNOSIS — R197 Diarrhea, unspecified: Secondary | ICD-10-CM | POA: Diagnosis not present

## 2022-10-08 DIAGNOSIS — N2581 Secondary hyperparathyroidism of renal origin: Secondary | ICD-10-CM | POA: Diagnosis not present

## 2022-10-08 DIAGNOSIS — Z992 Dependence on renal dialysis: Secondary | ICD-10-CM | POA: Diagnosis not present

## 2022-10-08 DIAGNOSIS — D509 Iron deficiency anemia, unspecified: Secondary | ICD-10-CM | POA: Diagnosis not present

## 2022-10-08 DIAGNOSIS — D631 Anemia in chronic kidney disease: Secondary | ICD-10-CM | POA: Diagnosis not present

## 2022-10-08 DIAGNOSIS — N186 End stage renal disease: Secondary | ICD-10-CM | POA: Diagnosis not present

## 2022-10-08 DIAGNOSIS — D689 Coagulation defect, unspecified: Secondary | ICD-10-CM | POA: Diagnosis not present

## 2022-10-10 DIAGNOSIS — R197 Diarrhea, unspecified: Secondary | ICD-10-CM | POA: Diagnosis not present

## 2022-10-10 DIAGNOSIS — N186 End stage renal disease: Secondary | ICD-10-CM | POA: Diagnosis not present

## 2022-10-10 DIAGNOSIS — Z992 Dependence on renal dialysis: Secondary | ICD-10-CM | POA: Diagnosis not present

## 2022-10-10 DIAGNOSIS — D689 Coagulation defect, unspecified: Secondary | ICD-10-CM | POA: Diagnosis not present

## 2022-10-10 DIAGNOSIS — D631 Anemia in chronic kidney disease: Secondary | ICD-10-CM | POA: Diagnosis not present

## 2022-10-10 DIAGNOSIS — N2581 Secondary hyperparathyroidism of renal origin: Secondary | ICD-10-CM | POA: Diagnosis not present

## 2022-10-10 DIAGNOSIS — D509 Iron deficiency anemia, unspecified: Secondary | ICD-10-CM | POA: Diagnosis not present

## 2022-10-13 DIAGNOSIS — D509 Iron deficiency anemia, unspecified: Secondary | ICD-10-CM | POA: Diagnosis not present

## 2022-10-13 DIAGNOSIS — D689 Coagulation defect, unspecified: Secondary | ICD-10-CM | POA: Diagnosis not present

## 2022-10-13 DIAGNOSIS — Z992 Dependence on renal dialysis: Secondary | ICD-10-CM | POA: Diagnosis not present

## 2022-10-13 DIAGNOSIS — R197 Diarrhea, unspecified: Secondary | ICD-10-CM | POA: Diagnosis not present

## 2022-10-13 DIAGNOSIS — N186 End stage renal disease: Secondary | ICD-10-CM | POA: Diagnosis not present

## 2022-10-13 DIAGNOSIS — D631 Anemia in chronic kidney disease: Secondary | ICD-10-CM | POA: Diagnosis not present

## 2022-10-13 DIAGNOSIS — N2581 Secondary hyperparathyroidism of renal origin: Secondary | ICD-10-CM | POA: Diagnosis not present

## 2022-10-15 DIAGNOSIS — D631 Anemia in chronic kidney disease: Secondary | ICD-10-CM | POA: Diagnosis not present

## 2022-10-15 DIAGNOSIS — R197 Diarrhea, unspecified: Secondary | ICD-10-CM | POA: Diagnosis not present

## 2022-10-15 DIAGNOSIS — N186 End stage renal disease: Secondary | ICD-10-CM | POA: Diagnosis not present

## 2022-10-15 DIAGNOSIS — D509 Iron deficiency anemia, unspecified: Secondary | ICD-10-CM | POA: Diagnosis not present

## 2022-10-15 DIAGNOSIS — D689 Coagulation defect, unspecified: Secondary | ICD-10-CM | POA: Diagnosis not present

## 2022-10-15 DIAGNOSIS — Z992 Dependence on renal dialysis: Secondary | ICD-10-CM | POA: Diagnosis not present

## 2022-10-15 DIAGNOSIS — N2581 Secondary hyperparathyroidism of renal origin: Secondary | ICD-10-CM | POA: Diagnosis not present

## 2022-10-17 DIAGNOSIS — D509 Iron deficiency anemia, unspecified: Secondary | ICD-10-CM | POA: Diagnosis not present

## 2022-10-17 DIAGNOSIS — N2581 Secondary hyperparathyroidism of renal origin: Secondary | ICD-10-CM | POA: Diagnosis not present

## 2022-10-17 DIAGNOSIS — D689 Coagulation defect, unspecified: Secondary | ICD-10-CM | POA: Diagnosis not present

## 2022-10-17 DIAGNOSIS — R197 Diarrhea, unspecified: Secondary | ICD-10-CM | POA: Diagnosis not present

## 2022-10-17 DIAGNOSIS — D631 Anemia in chronic kidney disease: Secondary | ICD-10-CM | POA: Diagnosis not present

## 2022-10-17 DIAGNOSIS — N186 End stage renal disease: Secondary | ICD-10-CM | POA: Diagnosis not present

## 2022-10-17 DIAGNOSIS — Z992 Dependence on renal dialysis: Secondary | ICD-10-CM | POA: Diagnosis not present

## 2022-10-19 ENCOUNTER — Emergency Department (HOSPITAL_COMMUNITY): Payer: 59

## 2022-10-19 ENCOUNTER — Inpatient Hospital Stay (HOSPITAL_COMMUNITY)
Admission: EM | Admit: 2022-10-19 | Discharge: 2022-10-22 | DRG: 177 | Disposition: A | Discharge status: Home / Self Care | Payer: 59 | Attending: Family Medicine | Admitting: Family Medicine

## 2022-10-19 ENCOUNTER — Encounter (HOSPITAL_COMMUNITY): Payer: Self-pay | Admitting: Emergency Medicine

## 2022-10-19 ENCOUNTER — Other Ambulatory Visit: Payer: Self-pay

## 2022-10-19 DIAGNOSIS — R7989 Other specified abnormal findings of blood chemistry: Secondary | ICD-10-CM | POA: Diagnosis not present

## 2022-10-19 DIAGNOSIS — N186 End stage renal disease: Secondary | ICD-10-CM

## 2022-10-19 DIAGNOSIS — Z886 Allergy status to analgesic agent status: Secondary | ICD-10-CM | POA: Diagnosis not present

## 2022-10-19 DIAGNOSIS — Z7902 Long term (current) use of antithrombotics/antiplatelets: Secondary | ICD-10-CM

## 2022-10-19 DIAGNOSIS — R1084 Generalized abdominal pain: Secondary | ICD-10-CM | POA: Diagnosis not present

## 2022-10-19 DIAGNOSIS — I2510 Atherosclerotic heart disease of native coronary artery without angina pectoris: Secondary | ICD-10-CM | POA: Diagnosis present

## 2022-10-19 DIAGNOSIS — C61 Malignant neoplasm of prostate: Secondary | ICD-10-CM | POA: Diagnosis present

## 2022-10-19 DIAGNOSIS — M898X9 Other specified disorders of bone, unspecified site: Secondary | ICD-10-CM | POA: Diagnosis not present

## 2022-10-19 DIAGNOSIS — D631 Anemia in chronic kidney disease: Secondary | ICD-10-CM | POA: Diagnosis present

## 2022-10-19 DIAGNOSIS — Z888 Allergy status to other drugs, medicaments and biological substances status: Secondary | ICD-10-CM

## 2022-10-19 DIAGNOSIS — K219 Gastro-esophageal reflux disease without esophagitis: Secondary | ICD-10-CM | POA: Diagnosis not present

## 2022-10-19 DIAGNOSIS — U071 COVID-19: Principal | ICD-10-CM

## 2022-10-19 DIAGNOSIS — I428 Other cardiomyopathies: Secondary | ICD-10-CM | POA: Diagnosis not present

## 2022-10-19 DIAGNOSIS — E669 Obesity, unspecified: Secondary | ICD-10-CM | POA: Diagnosis present

## 2022-10-19 DIAGNOSIS — Z6833 Body mass index (BMI) 33.0-33.9, adult: Secondary | ICD-10-CM

## 2022-10-19 DIAGNOSIS — F32A Depression, unspecified: Secondary | ICD-10-CM | POA: Diagnosis present

## 2022-10-19 DIAGNOSIS — E785 Hyperlipidemia, unspecified: Secondary | ICD-10-CM | POA: Diagnosis not present

## 2022-10-19 DIAGNOSIS — N250 Renal osteodystrophy: Secondary | ICD-10-CM | POA: Diagnosis not present

## 2022-10-19 DIAGNOSIS — R109 Unspecified abdominal pain: Secondary | ICD-10-CM | POA: Diagnosis not present

## 2022-10-19 DIAGNOSIS — C779 Secondary and unspecified malignant neoplasm of lymph node, unspecified: Secondary | ICD-10-CM | POA: Diagnosis present

## 2022-10-19 DIAGNOSIS — Z923 Personal history of irradiation: Secondary | ICD-10-CM

## 2022-10-19 DIAGNOSIS — Z8249 Family history of ischemic heart disease and other diseases of the circulatory system: Secondary | ICD-10-CM

## 2022-10-19 DIAGNOSIS — E872 Acidosis, unspecified: Secondary | ICD-10-CM

## 2022-10-19 DIAGNOSIS — R9389 Abnormal findings on diagnostic imaging of other specified body structures: Secondary | ICD-10-CM | POA: Insufficient documentation

## 2022-10-19 DIAGNOSIS — F17210 Nicotine dependence, cigarettes, uncomplicated: Secondary | ICD-10-CM | POA: Diagnosis present

## 2022-10-19 DIAGNOSIS — Z8673 Personal history of transient ischemic attack (TIA), and cerebral infarction without residual deficits: Secondary | ICD-10-CM | POA: Diagnosis not present

## 2022-10-19 DIAGNOSIS — J449 Chronic obstructive pulmonary disease, unspecified: Secondary | ICD-10-CM | POA: Diagnosis not present

## 2022-10-19 DIAGNOSIS — R112 Nausea with vomiting, unspecified: Secondary | ICD-10-CM | POA: Diagnosis present

## 2022-10-19 DIAGNOSIS — R197 Diarrhea, unspecified: Secondary | ICD-10-CM | POA: Insufficient documentation

## 2022-10-19 DIAGNOSIS — K7460 Unspecified cirrhosis of liver: Secondary | ICD-10-CM | POA: Diagnosis not present

## 2022-10-19 DIAGNOSIS — N200 Calculus of kidney: Secondary | ICD-10-CM | POA: Diagnosis not present

## 2022-10-19 DIAGNOSIS — Z8614 Personal history of Methicillin resistant Staphylococcus aureus infection: Secondary | ICD-10-CM

## 2022-10-19 DIAGNOSIS — Z992 Dependence on renal dialysis: Secondary | ICD-10-CM

## 2022-10-19 DIAGNOSIS — N281 Cyst of kidney, acquired: Secondary | ICD-10-CM | POA: Diagnosis not present

## 2022-10-19 DIAGNOSIS — I12 Hypertensive chronic kidney disease with 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: Secondary | ICD-10-CM | POA: Diagnosis not present

## 2022-10-19 DIAGNOSIS — R918 Other nonspecific abnormal finding of lung field: Secondary | ICD-10-CM | POA: Diagnosis not present

## 2022-10-19 DIAGNOSIS — E875 Hyperkalemia: Secondary | ICD-10-CM | POA: Diagnosis not present

## 2022-10-19 DIAGNOSIS — N2581 Secondary hyperparathyroidism of renal origin: Secondary | ICD-10-CM | POA: Diagnosis not present

## 2022-10-19 DIAGNOSIS — Z79899 Other long term (current) drug therapy: Secondary | ICD-10-CM

## 2022-10-19 DIAGNOSIS — R079 Chest pain, unspecified: Secondary | ICD-10-CM | POA: Diagnosis not present

## 2022-10-19 DIAGNOSIS — A419 Sepsis, unspecified organism: Secondary | ICD-10-CM | POA: Diagnosis not present

## 2022-10-19 DIAGNOSIS — Z823 Family history of stroke: Secondary | ICD-10-CM

## 2022-10-19 HISTORY — DX: Nausea with vomiting, unspecified: R11.2

## 2022-10-19 LAB — COMPREHENSIVE METABOLIC PANEL
ALT: 10 U/L (ref 0–44)
AST: 18 U/L (ref 15–41)
Albumin: 3.6 g/dL (ref 3.5–5.0)
Alkaline Phosphatase: 136 U/L — ABNORMAL HIGH (ref 38–126)
Anion gap: 18 — ABNORMAL HIGH (ref 5–15)
BUN: 38 mg/dL — ABNORMAL HIGH (ref 6–20)
CO2: 24 mmol/L (ref 22–32)
Calcium: 9.5 mg/dL (ref 8.9–10.3)
Chloride: 95 mmol/L — ABNORMAL LOW (ref 98–111)
Creatinine, Ser: 12.49 mg/dL — ABNORMAL HIGH (ref 0.61–1.24)
GFR, Estimated: 4 mL/min — ABNORMAL LOW (ref >60)
Glucose, Bld: 87 mg/dL (ref 70–99)
Potassium: 6.3 mmol/L (ref 3.5–5.1)
Sodium: 137 mmol/L (ref 135–145)
Total Bilirubin: 0.5 mg/dL (ref 0.3–1.2)
Total Protein: 7.4 g/dL (ref 6.5–8.1)

## 2022-10-19 LAB — CBC
HCT: 39.4 % (ref 39.0–52.0)
HCT: 36 % — ABNORMAL LOW (ref 39.0–52.0)
Hemoglobin: 12.8 g/dL — ABNORMAL LOW (ref 13.0–17.0)
Hemoglobin: 11.3 g/dL — ABNORMAL LOW (ref 13.0–17.0)
MCH: 33.4 pg (ref 26.0–34.0)
MCH: 32.6 pg (ref 26.0–34.0)
MCHC: 32.5 g/dL (ref 30.0–36.0)
MCHC: 31.4 g/dL (ref 30.0–36.0)
MCV: 102.9 fL — ABNORMAL HIGH (ref 80.0–100.0)
MCV: 103.7 fL — ABNORMAL HIGH (ref 80.0–100.0)
Platelets: 162 10*3/uL (ref 150–400)
Platelets: 142 10*3/uL — ABNORMAL LOW (ref 150–400)
RBC: 3.83 MIL/uL — ABNORMAL LOW (ref 4.22–5.81)
RBC: 3.47 MIL/uL — ABNORMAL LOW (ref 4.22–5.81)
RDW: 13.5 % (ref 11.5–15.5)
RDW: 13.7 % (ref 11.5–15.5)
WBC: 5.4 10*3/uL (ref 4.0–10.5)
WBC: 4.9 10*3/uL (ref 4.0–10.5)
nRBC: 0 % (ref 0.0–0.2)
nRBC: 0 % (ref 0.0–0.2)

## 2022-10-19 LAB — RESP PANEL BY RT-PCR (RSV, FLU A&B, COVID)  RVPGX2
Influenza A by PCR: NEGATIVE
Influenza B by PCR: NEGATIVE
Resp Syncytial Virus by PCR: NEGATIVE
SARS Coronavirus 2 by RT PCR: POSITIVE — AB

## 2022-10-19 LAB — RENAL FUNCTION PANEL
Albumin: 3.2 g/dL — ABNORMAL LOW (ref 3.5–5.0)
Anion gap: 14 (ref 5–15)
BUN: 39 mg/dL — ABNORMAL HIGH (ref 6–20)
CO2: 25 mmol/L (ref 22–32)
Calcium: 8.9 mg/dL (ref 8.9–10.3)
Chloride: 95 mmol/L — ABNORMAL LOW (ref 98–111)
Creatinine, Ser: 12.59 mg/dL — ABNORMAL HIGH (ref 0.61–1.24)
GFR, Estimated: 4 mL/min — ABNORMAL LOW (ref >60)
Glucose, Bld: 65 mg/dL — ABNORMAL LOW (ref 70–99)
Phosphorus: 4.5 mg/dL (ref 2.5–4.6)
Potassium: 5.4 mmol/L — ABNORMAL HIGH (ref 3.5–5.1)
Sodium: 134 mmol/L — ABNORMAL LOW (ref 135–145)

## 2022-10-19 LAB — APTT: aPTT: 30 s (ref 24–36)

## 2022-10-19 LAB — HEPATITIS B SURFACE ANTIGEN: Hepatitis B Surface Ag: NONREACTIVE

## 2022-10-19 LAB — LIPASE, BLOOD: Lipase: 34 U/L (ref 11–51)

## 2022-10-19 LAB — PROTIME-INR
INR: 1.1 (ref 0.8–1.2)
Prothrombin Time: 14.5 s (ref 11.4–15.2)

## 2022-10-19 LAB — I-STAT CG4 LACTIC ACID, ED
Lactic Acid, Venous: 2 mmol/L (ref 0.5–1.9)
Lactic Acid, Venous: 3.2 mmol/L (ref 0.5–1.9)

## 2022-10-19 MED ORDER — ACETAMINOPHEN 325 MG PO TABS
650.0000 mg | ORAL_TABLET | Freq: Four times a day (QID) | ORAL | Status: DC | PRN
  Administered 2022-10-20: 650 mg via ORAL
  Filled 2022-10-19 (×2): qty 2

## 2022-10-19 MED ORDER — NICOTINE 14 MG/24HR TD PT24
14.0000 mg | MEDICATED_PATCH | Freq: Every day | TRANSDERMAL | Status: DC
  Administered 2022-10-20 – 2022-10-22 (×3): 14 mg via TRANSDERMAL
  Filled 2022-10-19 (×3): qty 1

## 2022-10-19 MED ORDER — CHLORHEXIDINE GLUCONATE CLOTH 2 % EX PADS
6.0000 | MEDICATED_PAD | Freq: Every day | CUTANEOUS | Status: DC
  Administered 2022-10-20 – 2022-10-22 (×3): 6 via TOPICAL

## 2022-10-19 MED ORDER — CALCIUM GLUCONATE-NACL 1-0.675 GM/50ML-% IV SOLN
1.0000 g | Freq: Once | INTRAVENOUS | Status: AC
  Administered 2022-10-19: 1000 mg via INTRAVENOUS
  Filled 2022-10-19: qty 50

## 2022-10-19 MED ORDER — ONDANSETRON HCL 4 MG/2ML IJ SOLN
4.0000 mg | Freq: Once | INTRAMUSCULAR | Status: AC
  Administered 2022-10-19: 4 mg via INTRAVENOUS
  Filled 2022-10-19: qty 2

## 2022-10-19 MED ORDER — HEPARIN SODIUM (PORCINE) 1000 UNIT/ML DIALYSIS: 20.0000 [IU]/kg | INTRAMUSCULAR | Status: DC | PRN

## 2022-10-19 MED ORDER — ROSUVASTATIN CALCIUM 5 MG PO TABS
10.0000 mg | ORAL_TABLET | Freq: Every day | ORAL | Status: DC
  Administered 2022-10-20 – 2022-10-21 (×2): 10 mg via ORAL
  Filled 2022-10-19 (×2): qty 2

## 2022-10-19 MED ORDER — ANTICOAGULANT SODIUM CITRATE 4% (200MG/5ML) IV SOLN
5.0000 mL | Status: DC | PRN
  Filled 2022-10-19: qty 5

## 2022-10-19 MED ORDER — ALTEPLASE 2 MG IJ SOLR: 2.0000 mg | Freq: Once | INTRAMUSCULAR | Status: DC | PRN

## 2022-10-19 MED ORDER — INSULIN ASPART 100 UNIT/ML IV SOLN
5.0000 [IU] | Freq: Once | INTRAVENOUS | Status: AC
  Administered 2022-10-19: 5 [IU] via INTRAVENOUS

## 2022-10-19 MED ORDER — LIDOCAINE HCL (PF) 1 % IJ SOLN: 5.0000 mL | INTRAMUSCULAR | Status: DC | PRN

## 2022-10-19 MED ORDER — HYDROXYZINE HCL 25 MG PO TABS
25.0000 mg | ORAL_TABLET | Freq: Every day | ORAL | Status: DC
  Administered 2022-10-20 – 2022-10-21 (×2): 25 mg via ORAL
  Filled 2022-10-19 (×2): qty 1

## 2022-10-19 MED ORDER — PENTAFLUOROPROP-TETRAFLUOROETH EX AERO: 1.0000 | INHALATION_SPRAY | CUTANEOUS | Status: DC | PRN

## 2022-10-19 MED ORDER — TAMSULOSIN HCL 0.4 MG PO CAPS
0.4000 mg | ORAL_CAPSULE | Freq: Every day | ORAL | Status: DC
  Administered 2022-10-20 – 2022-10-21 (×2): 0.4 mg via ORAL
  Filled 2022-10-19 (×2): qty 1

## 2022-10-19 MED ORDER — HEPARIN SODIUM (PORCINE) 5000 UNIT/ML IJ SOLN
5000.0000 [IU] | Freq: Three times a day (TID) | INTRAMUSCULAR | Status: DC
  Administered 2022-10-20 – 2022-10-21 (×4): 5000 [IU] via SUBCUTANEOUS
  Filled 2022-10-19 (×6): qty 1

## 2022-10-19 MED ORDER — LIDOCAINE-PRILOCAINE 2.5-2.5 % EX CREA: 1.0000 | TOPICAL_CREAM | CUTANEOUS | Status: DC | PRN

## 2022-10-19 MED ORDER — HEPARIN SODIUM (PORCINE) 1000 UNIT/ML DIALYSIS: 1000.0000 [IU] | INTRAMUSCULAR | Status: DC | PRN

## 2022-10-19 MED ORDER — LATANOPROST 0.005 % OP SOLN
1.0000 [drp] | Freq: Every day | OPHTHALMIC | Status: DC
  Administered 2022-10-20 – 2022-10-21 (×2): 1 [drp] via OPHTHALMIC
  Filled 2022-10-19: qty 2.5

## 2022-10-19 MED ORDER — ALBUTEROL SULFATE (2.5 MG/3ML) 0.083% IN NEBU
INHALATION_SOLUTION | RESPIRATORY_TRACT | Status: AC
  Administered 2022-10-19: 10 mg via RESPIRATORY_TRACT
  Filled 2022-10-19: qty 12

## 2022-10-19 MED ORDER — ONDANSETRON HCL 4 MG/2ML IJ SOLN
4.0000 mg | Freq: Four times a day (QID) | INTRAMUSCULAR | Status: DC | PRN
  Administered 2022-10-20: 4 mg via INTRAVENOUS
  Filled 2022-10-19 (×2): qty 2

## 2022-10-19 MED ORDER — CLOPIDOGREL BISULFATE 75 MG PO TABS
75.0000 mg | ORAL_TABLET | Freq: Every day | ORAL | Status: DC
  Administered 2022-10-20 – 2022-10-21 (×2): 75 mg via ORAL
  Filled 2022-10-19 (×2): qty 1

## 2022-10-19 MED ORDER — ACETAMINOPHEN 325 MG PO TABS
325.0000 mg | ORAL_TABLET | Freq: Once | ORAL | Status: AC
  Administered 2022-10-19: 325 mg via ORAL
  Filled 2022-10-19: qty 1

## 2022-10-19 MED ORDER — HYDRALAZINE HCL 50 MG PO TABS
50.0000 mg | ORAL_TABLET | Freq: Two times a day (BID) | ORAL | Status: DC
  Administered 2022-10-20 – 2022-10-21 (×3): 50 mg via ORAL
  Filled 2022-10-19 (×4): qty 1

## 2022-10-19 MED ORDER — DEXTROSE 50 % IV SOLN
1.0000 | Freq: Once | INTRAVENOUS | Status: AC
  Administered 2022-10-19: 50 mL via INTRAVENOUS
  Filled 2022-10-19: qty 50

## 2022-10-19 MED ORDER — SODIUM ZIRCONIUM CYCLOSILICATE 10 G PO PACK
10.0000 g | PACK | Freq: Every day | ORAL | Status: DC
  Administered 2022-10-19 – 2022-10-21 (×3): 10 g via ORAL
  Filled 2022-10-19 (×3): qty 1

## 2022-10-19 MED ORDER — AMLODIPINE BESYLATE 10 MG PO TABS
10.0000 mg | ORAL_TABLET | Freq: Every day | ORAL | Status: DC
  Administered 2022-10-20 – 2022-10-21 (×2): 10 mg via ORAL
  Filled 2022-10-19 (×2): qty 1

## 2022-10-19 MED ORDER — DOXERCALCIFEROL 4 MCG/2ML IV SOLN
6.0000 ug | INTRAVENOUS | Status: DC
  Administered 2022-10-22: 6 ug via INTRAVENOUS
  Filled 2022-10-19 (×4): qty 4

## 2022-10-19 MED ORDER — ALBUTEROL SULFATE (2.5 MG/3ML) 0.083% IN NEBU
10.0000 mg | INHALATION_SOLUTION | Freq: Once | RESPIRATORY_TRACT | Status: AC
  Filled 2022-10-19: qty 12

## 2022-10-19 MED ORDER — IOHEXOL 350 MG/ML SOLN
75.0000 mL | Freq: Once | INTRAVENOUS | Status: AC | PRN
  Administered 2022-10-19: 75 mL via INTRAVENOUS

## 2022-10-19 MED ORDER — CINACALCET HCL 30 MG PO TABS
90.0000 mg | ORAL_TABLET | Freq: Every day | ORAL | Status: DC
  Administered 2022-10-20 – 2022-10-21 (×2): 90 mg via ORAL
  Filled 2022-10-19 (×3): qty 3

## 2022-10-19 MED ORDER — ISOSORBIDE DINITRATE 20 MG PO TABS
20.0000 mg | ORAL_TABLET | Freq: Two times a day (BID) | ORAL | Status: DC
  Administered 2022-10-20 – 2022-10-21 (×4): 20 mg via ORAL
  Filled 2022-10-19 (×6): qty 1

## 2022-10-19 MED ORDER — SODIUM CHLORIDE 0.9 % IV BOLUS
500.0000 mL | Freq: Once | INTRAVENOUS | Status: AC
  Administered 2022-10-19: 500 mL via INTRAVENOUS

## 2022-10-19 MED ORDER — SEVELAMER CARBONATE 800 MG PO TABS
1600.0000 mg | ORAL_TABLET | Freq: Three times a day (TID) | ORAL | Status: DC
  Administered 2022-10-20 – 2022-10-21 (×4): 1600 mg via ORAL
  Filled 2022-10-19 (×6): qty 2

## 2022-10-19 MED ORDER — PANTOPRAZOLE SODIUM 20 MG PO TBEC
20.0000 mg | DELAYED_RELEASE_TABLET | Freq: Every day | ORAL | Status: DC
  Administered 2022-10-20 – 2022-10-21 (×2): 20 mg via ORAL
  Filled 2022-10-19 (×3): qty 1

## 2022-10-19 NOTE — Consult Note (Signed)
[facility] Nephrology Consult Note: Reason for Consult: To manage dialysis and dialysis related needs Referring Physician: Dr. Durwin Nora, Alycia Rossetti  HPI:  James Wall is an 57 y.o. male with past medical history significant for hypertension, anemia, HLD, CVA, ESRD on HD MWF nonadherence with full HD treatment presented with nausea vomiting and fever, seen as a consultation for the evaluation and management of ESRD. The patient said he had dialysis on Friday.  He usually shortens his treatment.  Since yesterday he started having some nausea vomiting and today he had some chicken broth which led him to worsening his symptoms.  Denies chest pain or shortness of breath. In the ER: The temperature was 100.5, blood pressure 158/103, in room air.  The labs showed potassium level of 6.3, BUN 38, WBC 5.4, hemoglobin 12.8.  Chest x-ray showed possible bronchitis or reactive airway disease.  No pulmonary edema. He received albuterol, calcium, insulin dextrose and Lokelma in the ER.  Pending CT scan of abdomen pelvis.  He is being admitted for further evaluation and management. OP HD orders:  Dialyzes at [facility]. MWF, 4 hours, EDW: 87 kg. 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)    primary cancer is prostate   Anemia associated with chronic renal failure    Anxiety    Arthritis    CHF (congestive heart failure) (HCC)    Cirrhosis, nonalcoholic (HCC)    per pt possible from a medication , unsure ;   last ultrasound 08-09-2020 in epic no fibrosis   COPD (chronic obstructive pulmonary disease) (HCC)    Depression    ESRD on hemodialysis (HCC) 2009   Started HD Jan 2009;  ESRD secondary to hypertensive nephrosclerosis;  dialysis at [facility] on MWF   First degree heart block    GERD (gastroesophageal reflux disease)    Hiatal hernia     History of acute respiratory failure 07/2013   admission;  HCAP w/ ARF with hypoxia   History of adenomatous polyp of colon    History of ascites    s/p paracentesis 01-31-2013 (5L) and last one 03-28-2013 (2.7L)   History of community acquired pneumonia 08/08/2020   admission ; LLL , POA   History of heart murmur in childhood    History of MRSA infection 12/19/2012   hospital admission;  w/ sepsis MRSA bacteramia AVF infection   History of sepsis 03/2017   admission;   HCAP w/ sepsis   Hyperlipidemia    Hypertension    Hypertensive heart disease    cardiologist--- dr Merlyn Lot;  nuclear stress test 06-16-2013 intermediate risk w/ mid-distal anterior wall ischemia , not gated;  cardiac cath 07-13-2013 in epic showed normal coronaries and LVSF,  ef not assessed, LCEDP   Hypertensive nephrosclerosis, 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)    Intolerance to cold    due to anemia   Malignant neoplasm prostate [facility])    urologist--- dr bell/  radiation onologist--- dr Kathrynn Running;  dx 01/ 2023,  Gleason 4+3, PSA 32   NICM (nonischemic cardiomyopathy) (HCC)    followed by cardiology;   last echo in epic 09-13-2020 ef 55-60%   OSA (obstructive sleep apnea) 2009   no  longer using cpap since the yr started 2009;   sleep study in epic 05-11-2007 severe  osa   Pneumonia    PSVT (paroxysmal supraventricular tachycardia)    event monitor 08-01-2019  SR w/ SVT runs , rare PAC/ PVC   Secondary hyperparathyroidism (HCC)    Stroke (HCC)    2023 weakness Lhand and Left leg   Wears glasses     Past Surgical History:  Procedure Laterality Date   AV FISTULA PLACEMENT Right 02/22/2013   Procedure:  CREATION  OF BRACHIAL CEPHALIC FISTULA RIGHT ARM;  Surgeon: Sherren Kerns, MD;  Location: [facility] OR;  Service: Vascular;  Laterality: Right;   AV FISTULA PLACEMENT Left 08/10/2014   Procedure: BASILIC VEIN TRANSPOSITION  ARTERIOVENOUS (AV) FISTULA CREATION LEFT UPPER  ARM;  Surgeon: Pryor Ochoa, MD;  Location: [facility] OR;  Service: Vascular;  Laterality: Left;   AV FISTULA PLACEMENT, RADIOCEPHALIC Left 02/22/2007   @[facility] ;  Left lower arm   COLONOSCOPY  11/30/2018   by dr beavers   CYSTOSCOPY W/ RETROGRADES Bilateral 06/26/2022   Procedure: CYSTOSCOPY WITH BILATERAL RETROGRADE PYELOGRAM;  Surgeon: Crista Elliot, MD;  Location: [facility] ORS;  Service: Urology;  Laterality: Bilateral;   ESOPHAGOGASTRODUODENOSCOPY (EGD) WITH PROPOFOL N/A 04/12/2013   Procedure: ESOPHAGOGASTRODUODENOSCOPY (EGD) WITH PROPOFOL;  Surgeon: Willis Modena, MD;  Location: [facility] ENDOSCOPY;  Service: Endoscopy;  Laterality: N/A;   EXCHANGE OF A DIALYSIS CATHETER Right 09/24/2021   Procedure: EXCHANGE OF A DIALYSIS CATHETER;  Surgeon: Chuck Hint, MD;  Location: [facility] OR;  Service: Vascular;  Laterality: Right;   GOLD SEED IMPLANT N/A 06/11/2021   Procedure: GOLD SEED IMPLANT;  Surgeon: Jannifer Hick, MD;  Location: [facility] ORS;  Service: Urology;  Laterality: N/A;   INSERTION OF DIALYSIS CATHETER N/A 12/23/2012   Procedure: INSERTION OF DIALYSIS CATHETER; ULTRASOUND GUIDED;  Surgeon: Chuck Hint, MD;  Location: [facility] OR;  Service: Vascular;  Laterality: N/A;   INSERTION OF DIALYSIS CATHETER  10/22/2015   Right IJ non-tunneled HD catheter, placed again in 2019   IR FLUORO GUIDE CV LINE RIGHT  08/18/2019   IR FLUORO GUIDE CV LINE RIGHT  10/07/2019   IR GENERIC HISTORICAL  10/22/2015   IR US GUIDE VASC ACCESS RIGHT 10/22/2015 [facility]INTER[facility]   IR GENERIC HISTORICAL  10/22/2015   IR FLUORO GUIDE CV LINE RIGHT 10/22/2015 [facility]   IR GENERIC HISTORICAL  10/23/2015   IR FLUORO GUIDE CV LINE RIGHT 10/23/2015 Jolaine Click, MD [facility]INTER[facility]   LEFT HEART CATHETERIZATION WITH CORONARY ANGIOGRAM N/A 07/13/2013   Procedure: LEFT HEART CATHETERIZATION WITH CORONARY ANGIOGRAM;  Surgeon: Corky Crafts, MD;  Location: [facility] CATH LAB;  Service: Cardiovascular;  Laterality: N/A;   LIGATION OF  ARTERIOVENOUS  FISTULA Left 12/22/2012   Procedure: LIGATION OF ARTERIOVENOUS  FISTULA;EXCISION OF LARGE ANEURYSMS;;  Surgeon: Sherren Kerns, MD;  Location: [facility] OR;  Service: Vascular;  Laterality: Left;   SPACE OAR INSTILLATION N/A 06/11/2021   Procedure: SPACE OAR INSTILLATION;  Surgeon: Jannifer Hick, MD;  Location: [facility] ORS;  Service: Urology;  Laterality: N/A;   UPPER GASTROINTESTINAL ENDOSCOPY  09/07/2018   by dr Orvan Falconer    Family History  Problem Relation Age of Onset   Hypertension Mother    Cerebrovascular Accident Father    Hypertension Father    Congestive Heart Failure Brother    Asthma Brother    Stomach cancer Neg Hx    Rectal cancer Neg Hx    Esophageal cancer Neg Hx    Colon cancer Neg Hx     Social History:  reports that  he has been smoking cigarettes. He has a 8.5 pack-year smoking history. He has never used smokeless tobacco. He reports current alcohol use. He reports current drug use. Frequency: 4.00 times per week. Drug: Marijuana.  Allergies:  Allergies  Allergen Reactions   Venofer  [Ferric Oxide] Itching   Aspirin Other (See Comments)    Avoids due to renal disease;  Made congestion worse     Medications: I have reviewed the patient's current medications.   Results for orders placed or performed during the hospital encounter of 10/19/22 (from the past 48 hour(s))  Lipase, blood     Status: None   Collection Time: 10/19/22  2:00 PM  Result Value Ref Range   Lipase 34 11 - 51 U/L    Comment: Performed at [facility], [address], [place], [place] [address]  Comprehensive metabolic panel     Status: Abnormal   Collection Time: 10/19/22  2:00 PM  Result Value Ref Range   Sodium 137 135 - 145 mmol/L   Potassium 6.3 (HH) 3.5 - 5.1 mmol/L    Comment: CRITICAL RESULT CALLED TO, READ BACK BY AND VERIFIED WITH J,BLU RN @1513  10/19/22 E,BENTON   Chloride 95 (L) 98 - 111 mmol/L   CO2 24 22 - 32 mmol/L   Glucose, Bld 87 70 - 99 mg/dL    Comment:  Glucose reference range applies only to samples taken after fasting for at least 8 hours.   BUN 38 (H) 6 - 20 mg/dL   Creatinine, Ser 86.57 (H) 0.61 - 1.24 mg/dL   Calcium 9.5 8.9 - 84.6 mg/dL   Total Protein 7.4 6.5 - 8.1 g/dL   Albumin 3.6 3.5 - 5.0 g/dL   AST 18 15 - 41 U/L   ALT 10 0 - 44 U/L   Alkaline Phosphatase 136 (H) 38 - 126 U/L   Total Bilirubin 0.5 0.3 - 1.2 mg/dL   GFR, Estimated 4 (L) >60 mL/min    Comment: (NOTE) Calculated using the CKD-EPI Creatinine Equation (2021)    Anion gap 18 (H) 5 - 15    Comment: ELECTROLYTES REPEATED TO VERIFY Performed at [facility], [address], [place], [place] [address]   CBC     Status: Abnormal   Collection Time: 10/19/22  2:00 PM  Result Value Ref Range   WBC 5.4 4.0 - 10.5 K/uL   RBC 3.83 (L) 4.22 - 5.81 MIL/uL   Hemoglobin 12.8 (L) 13.0 - 17.0 g/dL   HCT 28.4 13.2 - 44.0 %   MCV 102.9 (H) 80.0 - 100.0 fL   MCH 33.4 26.0 - 34.0 pg   MCHC 32.5 30.0 - 36.0 g/dL   RDW 10.2 72.5 - 36.6 %   Platelets 162 150 - 400 K/uL   nRBC 0.0 0.0 - 0.2 %    Comment: Performed at [facility], [address] [place], [place] [address]    No results found.  ROS: As per H&P, rest of the systems reviewed and negative. Blood pressure (!) 158/132, pulse 76, temperature (!) 100.5 F (38.1 C), temperature source Oral, resp. rate 18, SpO2 100%. Gen: NAD, comfortable Respiratory: Clear bilateral, no wheezing or crackle Cardiovascular: Regular rate rhythm S1-S2 normal, no rubs GI: Abdomen soft, nontender, nondistended Extremities, no cyanosis or clubbing, no edema Skin: No rash or ulcer Neurology: Alert, awake, following commands, oriented Dialysis Access: Right IJ TDC in place.  Assessment/Plan:  # Acute febrile illness with nausea and vomiting: Getting  blood culture and CT scan by ER.  Further management per primary team.  # Hyperkalemia: Received medical treatment in the ER already.  We will try to do dialysis  tonight.  # ESRD: MWF.  Shortens treatment option at outpatient.  Right IJ TDC for the access.  HD tonight or tomorrow depending on nursing staffing etc.  # Hypertension: Resume home medication including Norvasc, hydralazine, isosorbide.  Monitor BP.  # Anemia of ESRD: Hemoglobin above goal.  # Metabolic Bone Disease: Monitor phosphorus level.  Resume Renvela.  Hectorol.  Thank you for the consult.  We will continue to follow with you. Discussed with the ER provider I will talk to dialysis nurse as well.  Brittinee Risk Jaynie Collins 10/19/2022, 3:31 PM

## 2022-10-19 NOTE — Assessment & Plan Note (Signed)
Nephrology consulted. -HD tonight -Continue home ESRD medications -AM RFP

## 2022-10-19 NOTE — H&P (Cosign Needed Addendum)
Hospital Admission History and Physical Service Pager: [phone]  Patient name: James Wall [facility] [employer] [facility] Medical record number: [id number] Date of Birth: [birth year] Age: 57 y.o. Gender: male  Primary Care Provider: Shelby Mattocks, DO Consultants: nephrology Code Status: Full Preferred Emergency Contact:  Reymundo Poll [phone] Eddie Candle - Sister [phone]  Chief Complaint: Nausea, vomiting  Assessment and Plan: James Wall is a 57 y.o. male with pertinent past medical history ESRD on HD MWF, CHF, hypertension, stroke, prostate cancer presenting with nausea and vomiting since yesterday.  Admitted for nausea and vomiting in addition to hyperkalemia at 5.4.  I suspect the abdominal pain, nausea and vomiting is secondary to COVID infection and constipation.  I suspect hyperkalemia secondary to shortened session of HD.  Patient also never completes his dialysis runs due to cramping.  Additional differentials of nausea and vomiting include viral gastroenteritis, acute abdominal pathology such as SBO, electrolyte disturbance.  Low suspicion for SBO as CT abdomen with no acute abnormality, and reassuring abdominal exam.   Assessment & Plan Hyperkalemia Likely secondary to incomplete dialysis run 2 days ago. S/p insulin, calcium gluconate, albuterol, Lokelma in ED. K improved 6.3>5.4. No concerning EKG changes. - Admitted to FM TS, attending Dr. Miquel Dunn - Telemetry x 12 hours - Dialysis tonight  - Nephrology following, appreciate recs -RFP tomorrow morning Nausea and vomiting CT abdomen pelvis in ED unremarkable for acute abnormality. I suspect this is secondary to COVID infection and hyperkalemia. Constipation likely contributed though this has improved (s/p 3 BMs in ED w/ improved abdominal pain) - Renal diet as tolerated - Supportive care for COVID symptoms - Zofran 4 mg every 6 hours as needed - CBC, RFP in the morning - Droplet and contact precautions COVID-19 COVID  test positive in the ED. Patient is overall well-appearing, on room air. LA 2 > 3.2 - Droplet and contact precautions - Supportive care -Tylenol as needed for fever/pain - Repeat LA ESRD (end stage renal disease) [facility]) Nephrology consulted. -HD tonight -Continue home ESRD medications -AM RFP Abnormal finding on CT scan CTAP showed lesion on left kidney read as consistent with small RCC.  No history found of RCC on chart review.  Reassuringly this lesion is reported unchanged compared to imaging on 04/29/2022 however I cannot find this imaging.  Chronic and Stable Problems:  HTN - amlodipine 10mg  every day, clonidine 0.3mg  patch once weekly, hydralazine 50mg  BID, metoprolol tartrate 100mg  BID HLD - rosuvastatin 10mg  every day GERD - protonix 20mg  every day Stroke 2023 - plavix 75mg    FEN/GI: renal diet VTE Prophylaxis: heparin  Disposition: inpatient status, med-tele  History of Present Illness:  James Wall is a 57 y.o. male presenting with nausea, vomiting (x4 today) that started last night. Pt states that he has been unable to tolerate chicken broth and prune juice. Also reports productive cough with clear sputum and diffuse abdominal pain that has now resolved after 3 BM in the ED.  Pt is ESRD on HD MWF and never receives a full run due to severe muscle cramping. His last dialysis was 9/13.  Patient does not make urine. Denies chest pain, SOB, dizziness, vision changes. Admits to hot flashes/sweating d/t hormone injections for prostate cancer, last injection in May per pt.    In the ED, patient was febrile on arrival at 100.5, Tylenol 325 mg given with resolution of fever. Lactic 2.0 on arrival, increased to 3.2, thought to be secondary to albuterol treatment. Potassium 6.3, last dialyzed two  days ago. COVID +, flu negative.  Blood cultures collected. Nephrology evaluated pt in ED and plans to dialyze tonight or tomorrow. 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   Pertinent Past Surgical History: Left heart cath 2015  Remainder reviewed in history tab.  Pertinent Social History: Tobacco use: Yes - 8 cigarettes/day - nicotine patches ordered  Alcohol use: on rare occasion - birthday/christmas  Other Substance use: marijuana Lives alone  Pertinent Family History: Mother - HTN Father - CVA, HTN Brother - CHF  Remainder reviewed in history tab.   Important Outpatient Medications: Norvasc 10mg  every day Clonidine 0.3mg  patch once a week on Sunday Plavix 75mg  every day Hydralazine 50mg  BID Isosorbide dinitrate 20mg  BID Metoprolol tartrate 100mg  BID Oxybutynin 5mg  BID PRN Protonix 20mg  every day Rosuvastatin 10mg  every day Sensipar 90mg  every day Renvela 4 tablets by mouth TID with meals Flomax 0.4mg  every day  Remainder reviewed in medication history.   Objective: BP (!) 145/101   Pulse (!) 114   Temp 98.5 F (36.9 C) (Oral)   Resp 16   SpO2 100%  Exam: General: No acute distress, lying in bed Eyes: EOMI ENTM: Moist mucous membranes Neck: Full range of motion Cardiovascular: Regular rate and rhythm, no murmurs Respiratory: Clear to auscultation bilaterally, no increased work of breathing on room air.  1 episode of productive cough with clear sputum while examining patient. Gastrointestinal: Soft, nondistended.  Some TTP diffuse lower abdomen. MSK: No leg swelling bilateral lower extremities. Neuro: A&Ox4 Psych: normal affect  Labs:  CBC BMET  Recent Labs  Lab 10/19/22 1928  WBC 4.9  HGB 11.3*  HCT 36.0*  PLT 142*   Recent Labs  Lab 10/19/22 1928  NA 134*  K 5.4*  CL 95*  CO2 25  BUN  39*  CREATININE 12.59*  GLUCOSE 65*  CALCIUM 8.9     COVID + Lactate 2.0 > 3.2 Prothrombin time 14.5, INR 1.1 APTT 30  EKG: NSR at 75bpm, Qtc   Imaging Studies Performed: CXR IMPRESSION: 1. Mild central airway thickening which may reflect bronchitis or reactive airways disease. 2. Right IJ central venous catheter tip: SVC.  CT abdomen Pelvis w Contrast IMPRESSION: 1. No acute CT findings of the abdomen or pelvis to explain pain or emesis. 2. In the peripheral midportion of the left kidney, there is an intermediate attenuation lesion measuring 2.0 x 1.6 cm, with previously reported contrast enhancement, consistent with a small renal cell carcinoma. This is unchanged compared to prior examination dated 04/29/2022. 3. Additional cysts, previously characterized by multiphasic CT as simple and hemorrhagic or proteinaceous cysts, benign. 4. Small bilateral nonobstructive renal calculi. No hydronephrosis. 5. Thickening of the decompressed urinary bladder wall, likely secondary to chronic outlet obstruction. 6. Mild prostatomegaly with biopsy marking clips or fiducials. 7. Coronary artery disease.  I independently reviewed and agree with radiologist's impression of imaging study. CXR with cardiomegaly.    Everhart, Kirstie, DO 10/19/2022, 9:23 PM PGY-1, [facility]  FPTS Intern pager: [phone], text pages welcome Secure chat group [facility] Teaching Service   I was personally present and re-performed the exam and medical decision making  and verified the service and findings are accurately documented in the intrern's note.  Erick Alley, DO 10/19/2022 10:28 PM

## 2022-10-19 NOTE — Assessment & Plan Note (Deleted)
Likely secondary to incomplete dialysis run 2 days ago and multiple episodes of vomiting - Telemetry x 12 hours -Lokelma gram packet given in the ED - Dialysis tonight or tomorrow per nephrology - Nephrology following, appreciate recs -RFP tomorrow morning

## 2022-10-19 NOTE — ED Notes (Signed)
Pt called out and said he used the bathroom on himself. Pt was assisted to a bedside commode and was cleaned up and placed in a clean brief afterwards. Pt is now sitting on the bed in a comfortable position.

## 2022-10-19 NOTE — Assessment & Plan Note (Signed)
Likely secondary to incomplete dialysis run 2 days ago. S/p insulin, calcium gluconate, albuterol, Lokelma in ED. K improved 6.3>5.4. No concerning EKG changes. - Admitted to FM TS, attending Dr. Miquel Dunn - Telemetry x 12 hours - Dialysis tonight  - Nephrology following, appreciate recs -RFP tomorrow morning

## 2022-10-19 NOTE — Assessment & Plan Note (Deleted)
I suspect this is secondary to COVID infection and hyperkalemia. -Admitted, inpatient status, med telemetry - Renal diet as tolerated - Supportive care for COVID symptoms -CT abdomen pelvis in ED unremarkable for acute abnormality - Zofran 4 mg every 6 hours as needed - CBC, RFP in the morning

## 2022-10-19 NOTE — Assessment & Plan Note (Signed)
>>  ASSESSMENT AND PLAN FOR ESRD (END STAGE RENAL DISEASE) (HCC) WRITTEN ON 10/19/2022 10:28 PM BY Erick Alley, DO  Nephrology consulted. -HD tonight -Continue home ESRD medications -AM RFP

## 2022-10-19 NOTE — Assessment & Plan Note (Signed)
COVID test positive in the ED. Patient is overall well-appearing, on room air. LA 2 > 3.2 - Droplet and contact precautions - Supportive care -Tylenol as needed for fever/pain - Repeat LA

## 2022-10-19 NOTE — Assessment & Plan Note (Signed)
CTAP showed lesion on left kidney read as consistent with small RCC.  No history found of RCC on chart review.  Reassuringly this lesion is reported unchanged compared to imaging on 04/29/2022 however I cannot find this imaging.

## 2022-10-19 NOTE — ED Notes (Signed)
ED TO INPATIENT HANDOFF REPORT  ED Nurse Name and Phone #: Cat [phone]  S Name/Age/Gender James Wall 57 y.o. male Room/Bed: 001C/001C  Code Status   Code Status: Full Code  Home/SNF/Other Home Patient oriented to: self, place, time, and situation Is this baseline? Yes   Triage Complete: Triage complete  Chief Complaint Nausea and vomiting [R11.2]  Triage Note Pt reports emesis after eating hot dogs. Pt unsure of fevers at home. Denies pain.    Allergies Allergies  Allergen Reactions   Venofer  [Ferric Oxide] Itching   Aspirin Other (See Comments)    Avoids due to renal disease;  Made congestion worse     Level of Care/Admitting Diagnosis ED Disposition     ED Disposition  Admit   Condition  --   Comment  Hospital Area: [facility] [[id number]]  Level of Care: Telemetry Medical [104]  May admit patient to [facility] or [facility] if equivalent level of care is available:: No  Covid Evaluation: Confirmed COVID Positive  Diagnosis: Nausea and vomiting [744752]  Admitting Physician: Para March [[id number]]  Attending Physician: Billey Co [[id number]]  Certification:: I certify this patient will need inpatient services for at least 2 midnights  Expected Medical Readiness: 10/21/2022          B Medical/Surgery History 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 [facility])    primary cancer is prostate   Anemia associated with chronic renal failure    Anxiety    Arthritis    CHF (congestive heart failure) (HCC)    Cirrhosis, nonalcoholic (HCC)    per pt possible from a medication , unsure ;   last ultrasound 08-09-2020 in epic no fibrosis   COPD (chronic obstructive pulmonary disease) (HCC)    Depression    ESRD on hemodialysis (HCC) 2009   Started HD Jan 2009;  ESRD secondary to hypertensive nephrosclerosis;  dialysis at [facility] on MWF    First degree heart block    GERD (gastroesophageal reflux disease)    Hiatal hernia    History of acute respiratory failure 07/2013   admission;  HCAP w/ ARF with hypoxia   History of adenomatous polyp of colon    History of ascites    s/p paracentesis 01-31-2013 (5L) and last one 03-28-2013 (2.7L)   History of community acquired pneumonia 08/08/2020   admission ; LLL , POA   History of heart murmur in childhood    History of MRSA infection 12/19/2012   hospital admission;  w/ sepsis MRSA bacteramia AVF infection   History of sepsis 03/2017   admission;   HCAP w/ sepsis   Hyperlipidemia    Hypertension    Hypertensive heart disease    cardiologist--- dr Merlyn Lot;  nuclear stress test 06-16-2013 intermediate risk w/ mid-distal anterior wall ischemia , not gated;  cardiac cath 07-13-2013 in epic showed normal coronaries and LVSF,  ef not assessed, LCEDP   Hypertensive nephrosclerosis, 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)    Intolerance to cold    due to anemia   Malignant neoplasm prostate [facility])    urologist--- dr bell/  radiation onologist--- dr Kathrynn Running;  dx 01/ 2023,  Gleason 4+3, PSA 32   Nausea and vomiting 10/19/2022   NICM (nonischemic cardiomyopathy) (HCC)    followed by cardiology;   last echo in epic 09-13-2020 ef 55-60%  OSA (obstructive sleep apnea) 2009   no  longer using cpap since the yr started 2009;   sleep study in epic 05-11-2007 severe osa   Pneumonia    PSVT (paroxysmal supraventricular tachycardia)    event monitor 08-01-2019  SR w/ SVT runs , rare PAC/ PVC   Secondary hyperparathyroidism (HCC)    Stroke (HCC)    2023 weakness Lhand and Left leg   Wears glasses    Past Surgical History:  Procedure Laterality Date   AV FISTULA PLACEMENT Right 02/22/2013   Procedure:  CREATION  OF BRACHIAL CEPHALIC FISTULA RIGHT ARM;  Surgeon: Sherren Kerns, MD;  Location: [facility] OR;  Service: Vascular;  Laterality: Right;   AV  FISTULA PLACEMENT Left 08/10/2014   Procedure: BASILIC VEIN TRANSPOSITION  ARTERIOVENOUS (AV) FISTULA CREATION LEFT UPPER ARM;  Surgeon: Pryor Ochoa, MD;  Location: [facility] OR;  Service: Vascular;  Laterality: Left;   AV FISTULA PLACEMENT, RADIOCEPHALIC Left 02/22/2007   @[facility] ;  Left lower arm   COLONOSCOPY  11/30/2018   by dr beavers   CYSTOSCOPY W/ RETROGRADES Bilateral 06/26/2022   Procedure: CYSTOSCOPY WITH BILATERAL RETROGRADE PYELOGRAM;  Surgeon: Crista Elliot, MD;  Location: [facility] ORS;  Service: Urology;  Laterality: Bilateral;   ESOPHAGOGASTRODUODENOSCOPY (EGD) WITH PROPOFOL N/A 04/12/2013   Procedure: ESOPHAGOGASTRODUODENOSCOPY (EGD) WITH PROPOFOL;  Surgeon: Willis Modena, MD;  Location: [facility] ENDOSCOPY;  Service: Endoscopy;  Laterality: N/A;   EXCHANGE OF A DIALYSIS CATHETER Right 09/24/2021   Procedure: EXCHANGE OF A DIALYSIS CATHETER;  Surgeon: Chuck Hint, MD;  Location: [facility] OR;  Service: Vascular;  Laterality: Right;   GOLD SEED IMPLANT N/A 06/11/2021   Procedure: GOLD SEED IMPLANT;  Surgeon: Jannifer Hick, MD;  Location: [facility] ORS;  Service: Urology;  Laterality: N/A;   INSERTION OF DIALYSIS CATHETER N/A 12/23/2012   Procedure: INSERTION OF DIALYSIS CATHETER; ULTRASOUND GUIDED;  Surgeon: Chuck Hint, MD;  Location: [facility] OR;  Service: Vascular;  Laterality: N/A;   INSERTION OF DIALYSIS CATHETER  10/22/2015   Right IJ non-tunneled HD catheter, placed again in 2019   IR FLUORO GUIDE CV LINE RIGHT  08/18/2019   IR FLUORO GUIDE CV LINE RIGHT  10/07/2019   IR GENERIC HISTORICAL  10/22/2015   IR US GUIDE VASC ACCESS RIGHT 10/22/2015 [facility]   IR GENERIC HISTORICAL  10/22/2015   IR FLUORO GUIDE CV LINE RIGHT 10/22/2015 [facility]   IR GENERIC HISTORICAL  10/23/2015   IR FLUORO GUIDE CV LINE RIGHT 10/23/2015 Jolaine Click, MD [facility]   LEFT HEART CATHETERIZATION WITH CORONARY ANGIOGRAM N/A 07/13/2013   Procedure: LEFT HEART CATHETERIZATION WITH CORONARY ANGIOGRAM;   Surgeon: Corky Crafts, MD;  Location: [facility] CATH LAB;  Service: Cardiovascular;  Laterality: N/A;   LIGATION OF ARTERIOVENOUS  FISTULA Left 12/22/2012   Procedure: LIGATION OF ARTERIOVENOUS  FISTULA;EXCISION OF LARGE ANEURYSMS;;  Surgeon: Sherren Kerns, MD;  Location: [facility] OR;  Service: Vascular;  Laterality: Left;   SPACE OAR INSTILLATION N/A 06/11/2021   Procedure: SPACE OAR INSTILLATION;  Surgeon: Jannifer Hick, MD;  Location: [facility] ORS;  Service: Urology;  Laterality: N/A;   UPPER GASTROINTESTINAL ENDOSCOPY  09/07/2018   by dr Orvan Falconer     A IV Location/Drains/Wounds Patient Lines/Drains/Airways Status     Active Line/Drains/Airways     Name Placement date Placement time Site Days   Peripheral IV 10/19/22 20 G Anterior;Distal;Left;Upper Arm 10/19/22  1605  Arm  less than 1   Hemodialysis Catheter Right Internal  jugular Double lumen Permanent (Tunneled) 09/24/21  1457  Internal jugular  390            Intake/Output Last 24 hours No intake or output data in the 24 hours ending 10/19/22 2017  Labs/Imaging Results for orders placed or performed during the hospital encounter of 10/19/22 (from the past 48 hour(s))  Lipase, blood     Status: None   Collection Time: 10/19/22  2:00 PM  Result Value Ref Range   Lipase 34 11 - 51 U/L    Comment: Performed at [facility] Lab, [address] [place], [place] [address]  Comprehensive metabolic panel     Status: Abnormal   Collection Time: 10/19/22  2:00 PM  Result Value Ref Range   Sodium 137 135 - 145 mmol/L   Potassium 6.3 (HH) 3.5 - 5.1 mmol/L    Comment: CRITICAL RESULT CALLED TO, READ BACK BY AND VERIFIED WITH J,BLU RN @1513  10/19/22 E,BENTON   Chloride 95 (L) 98 - 111 mmol/L   CO2 24 22 - 32 mmol/L   Glucose, Bld 87 70 - 99 mg/dL    Comment: Glucose reference range applies only to samples taken after fasting for at least 8 hours.   BUN 38 (H) 6 - 20 mg/dL   Creatinine, Ser 53.66 (H) 0.61 - 1.24 mg/dL   Calcium 9.5 8.9 -  44.0 mg/dL   Total Protein 7.4 6.5 - 8.1 g/dL   Albumin 3.6 3.5 - 5.0 g/dL   AST 18 15 - 41 U/L   ALT 10 0 - 44 U/L   Alkaline Phosphatase 136 (H) 38 - 126 U/L   Total Bilirubin 0.5 0.3 - 1.2 mg/dL   GFR, Estimated 4 (L) >60 mL/min    Comment: (NOTE) Calculated using the CKD-EPI Creatinine Equation (2021)    Anion gap 18 (H) 5 - 15    Comment: ELECTROLYTES REPEATED TO VERIFY Performed at [facility], [address], [place], [place] [address]   CBC     Status: Abnormal   Collection Time: 10/19/22  2:00 PM  Result Value Ref Range   WBC 5.4 4.0 - 10.5 K/uL   RBC 3.83 (L) 4.22 - 5.81 MIL/uL   Hemoglobin 12.8 (L) 13.0 - 17.0 g/dL   HCT 59.5 63.8 - 75.6 %   MCV 102.9 (H) 80.0 - 100.0 fL   MCH 33.4 26.0 - 34.0 pg   MCHC 32.5 30.0 - 36.0 g/dL   RDW 43.3 29.5 - 18.8 %   Platelets 162 150 - 400 K/uL   nRBC 0.0 0.0 - 0.2 %    Comment: Performed at [facility], [address], [place], [place] [address]  Resp panel by RT-PCR (RSV, Flu A&B, Covid) Anterior Nasal Swab     Status: Abnormal   Collection Time: 10/19/22  2:45 PM   Specimen: Anterior Nasal Swab  Result Value Ref Range   SARS Coronavirus 2 by RT PCR POSITIVE (A) NEGATIVE   Influenza A by PCR NEGATIVE NEGATIVE   Influenza B by PCR NEGATIVE NEGATIVE    Comment: (NOTE) The Xpert Xpress SARS-CoV-2/FLU/RSV plus assay is intended as an aid in the diagnosis of influenza from Nasopharyngeal swab specimens and should not be used as a sole basis for treatment. Nasal washings and aspirates are unacceptable for Xpert Xpress SARS-CoV-2/FLU/RSV testing.  Fact Sheet for Patients: BloggerCourse.com  Fact Sheet for Healthcare Providers: SeriousBroker.it  This test is not yet approved or cleared by the [place]  FDA and has been authorized for detection and/or diagnosis of SARS-CoV-2 by FDA under an Emergency Use Authorization (EUA). This EUA will remain in effect  (meaning this test can be used) for the duration of the COVID-19 declaration under Section 564(b)(1) of the Act, 21 U.S.C. section 360bbb-3(b)(1), unless the authorization is terminated or revoked.     Resp Syncytial Virus by PCR NEGATIVE NEGATIVE    Comment: (NOTE) Fact Sheet for Patients: BloggerCourse.com  Fact Sheet for Healthcare Providers: SeriousBroker.it  This test is not yet approved or cleared by the [place] FDA and has been authorized for detection and/or diagnosis of SARS-CoV-2 by FDA under an Emergency Use Authorization (EUA). This EUA will remain in effect (meaning this test can be used) for the duration of the COVID-19 declaration under Section 564(b)(1) of the Act, 21 U.S.C. section 360bbb-3(b)(1), unless the authorization is terminated or revoked.  Performed at [facility], [address] [place], [place] [address]   I-Stat Lactic Acid, ED     Status: Abnormal   Collection Time: 10/19/22  4:00 PM  Result Value Ref Range   Lactic Acid, Venous 2.0 (HH) 0.5 - 1.9 mmol/L  Protime-INR     Status: None   Collection Time: 10/19/22  4:00 PM  Result Value Ref Range   Prothrombin Time 14.5 11.4 - 15.2 seconds   INR 1.1 0.8 - 1.2    Comment: (NOTE) INR goal varies based on device and disease states. Performed at [facility], [address], [place], [place] [address]   APTT     Status: None   Collection Time: 10/19/22  4:00 PM  Result Value Ref Range   aPTT 30 24 - 36 seconds    Comment: Performed at [facility] Lab, [address] [place], [place] [address]  CBC     Status: Abnormal   Collection Time: 10/19/22  7:28 PM  Result Value Ref Range   WBC 4.9 4.0 - 10.5 K/uL   RBC 3.47 (L) 4.22 - 5.81 MIL/uL   Hemoglobin 11.3 (L) 13.0 - 17.0 g/dL   HCT 13.0 (L) 86.5 - 78.4 %   MCV 103.7 (H) 80.0 - 100.0 fL   MCH 32.6 26.0 - 34.0 pg   MCHC 31.4 30.0 - 36.0 g/dL   RDW 69.6 29.5 - 28.4 %    Platelets 142 (L) 150 - 400 K/uL   nRBC 0.0 0.0 - 0.2 %    Comment: Performed at [facility], [address], [place], [place] [address]  Renal function panel     Status: Abnormal   Collection Time: 10/19/22  7:28 PM  Result Value Ref Range   Sodium 134 (L) 135 - 145 mmol/L   Potassium 5.4 (H) 3.5 - 5.1 mmol/L   Chloride 95 (L) 98 - 111 mmol/L   CO2 25 22 - 32 mmol/L   Glucose, Bld 65 (L) 70 - 99 mg/dL    Comment: Glucose reference range applies only to samples taken after fasting for at least 8 hours.   BUN 39 (H) 6 - 20 mg/dL   Creatinine, Ser 01.02 (H) 0.61 - 1.24 mg/dL   Calcium 8.9 8.9 - 72.5 mg/dL   Phosphorus 4.5 2.5 - 4.6 mg/dL   Albumin 3.2 (L) 3.5 - 5.0 g/dL   GFR, Estimated 4 (L) >60 mL/min    Comment: (NOTE) Calculated using the CKD-EPI Creatinine Equation (2021)    Anion gap 14 5 - 15  Comment: Performed at [facility] Lab, [address], [place], [place] [address]  I-Stat Lactic Acid, ED     Status: Abnormal   Collection Time: 10/19/22  7:35 PM  Result Value Ref Range   Lactic Acid, Venous 3.2 (HH) 0.5 - 1.9 mmol/L   Comment NOTIFIED PHYSICIAN    CT ABDOMEN PELVIS W CONTRAST  Result Date: 10/19/2022 CLINICAL DATA:  Abdominal pain, emesis, history of prostate cancer EXAM: CT ABDOMEN AND PELVIS WITH CONTRAST TECHNIQUE: Multidetector CT imaging of the abdomen and pelvis was performed using the standard protocol following bolus administration of intravenous contrast. RADIATION DOSE REDUCTION: This exam was performed according to the departmental dose-optimization program which includes automated exposure control, adjustment of the mA and/or kV according to patient size and/or use of iterative reconstruction technique. CONTRAST:  75mL OMNIPAQUE IOHEXOL 350 MG/ML SOLN COMPARISON:  None Available. FINDINGS: Lower chest: No acute abnormality.  Coronary artery calcifications. Hepatobiliary: No solid liver abnormality is seen. No gallstones, gallbladder wall  thickening, or biliary dilatation. Pancreas: Unremarkable. No pancreatic ductal dilatation or surrounding inflammatory changes. Spleen: Normal in size without significant abnormality. Adrenals/Urinary Tract: Adrenal glands are unremarkable. Numerous bilateral renal cortical cysts, the majority of which are simple, benign fluid signal cysts. In the peripheral midportion of the left kidney, there is an intermediate attenuation lesion measuring 2.0 x 1.6 cm, with previously reported contrast enhancement (series 3, image 33) and in the posterior midportion of the right kidney there is a somewhat more hyperdense lesion measuring 1.6 x 1.5 cm, previously characterized as a benign nonenhancing hemorrhagic or proteinaceous cyst (series 3, image 33). These are unchanged compared to prior examination dated 04/29/2022. Small bilateral nonobstructive renal calculi. No hydronephrosis. Thickening of the decompressed urinary bladder wall, likely secondary to chronic outlet obstruction. Stomach/Bowel: Stomach is within normal limits. Appendix appears normal. No evidence of bowel wall thickening, distention, or inflammatory changes. Vascular/Lymphatic: Aortic atherosclerosis. No enlarged abdominal or pelvic lymph nodes. Reproductive: Mild prostatomegaly with biopsy marking clips or fiducials. Other: No abdominal wall hernia or abnormality. No ascites. Musculoskeletal: No acute or significant osseous findings. IMPRESSION: 1. No acute CT findings of the abdomen or pelvis to explain pain or emesis. 2. In the peripheral midportion of the left kidney, there is an intermediate attenuation lesion measuring 2.0 x 1.6 cm, with previously reported contrast enhancement, consistent with a small renal cell carcinoma. This is unchanged compared to prior examination dated 04/29/2022. 3. Additional cysts, previously characterized by multiphasic CT as simple and hemorrhagic or proteinaceous cysts, benign. 4. Small bilateral nonobstructive renal  calculi. No hydronephrosis. 5. Thickening of the decompressed urinary bladder wall, likely secondary to chronic outlet obstruction. 6. Mild prostatomegaly with biopsy marking clips or fiducials. 7. Coronary artery disease. Aortic Atherosclerosis (ICD10-I70.0). Electronically Signed   By: Jearld Lesch M.D.   On: 10/19/2022 18:52   DG Chest Port 1 View  Result Date: 10/19/2022 CLINICAL DATA:  Sepsis. Abdominal pain with nausea and vomiting. Chest pain. EXAM: PORTABLE CHEST 1 VIEW COMPARISON:  09/24/2021 FINDINGS: Right IJ central venous catheter tip: SVC. Atherosclerotic calcification of the aortic arch. Mild central airway thickening which may reflect bronchitis or reactive airways disease. Upper normal heart size. No blunting of the costophrenic angles. IMPRESSION: 1. Mild central airway thickening which may reflect bronchitis or reactive airways disease. 2. Right IJ central venous catheter tip: SVC. Electronically Signed   By: Gaylyn Rong M.D.   On: 10/19/2022 15:29    Pending Labs Unresulted Labs (From admission, onward)  Start     Ordered   10/20/22 0500  Renal function panel  Tomorrow morning,   R        10/19/22 2015   10/20/22 0500  CBC  Tomorrow morning,   R        10/19/22 2015   10/19/22 1630  Hepatitis B surface antibody,quantitative  Once,   R        10/19/22 1630   10/19/22 1555  Hepatitis B surface antigen  (New Admission Hemo Labs (Hepatitis B))  Once,   URGENT        10/19/22 1555   10/19/22 1456  Blood Culture (routine x 2)  (Undifferentiated presentation (screening labs and basic nursing orders))  BLOOD CULTURE X 2,   STAT      10/19/22 1456            Vitals/Pain Today's Vitals   10/19/22 1800 10/19/22 1900 10/19/22 1904 10/19/22 1905  BP: (!) 165/100 (!) 145/97    Pulse: 85 73    Resp: (!) 25 14    Temp:   98.5 F (36.9 C)   TempSrc:   Oral   SpO2: 100% 100%    PainSc:    5     Isolation Precautions Droplet and Contact  precautions  Medications Medications  sodium zirconium cyclosilicate (LOKELMA) packet 10 g (10 g Oral Given 10/19/22 1647)  Chlorhexidine Gluconate Cloth 2 % PADS 6 each (has no administration in time range)  pentafluoroprop-tetrafluoroeth (GEBAUERS) aerosol 1 Application (has no administration in time range)  lidocaine (PF) (XYLOCAINE) 1 % injection 5 mL (has no administration in time range)  lidocaine-prilocaine (EMLA) cream 1 Application (has no administration in time range)  heparin injection 1,000 Units (has no administration in time range)  anticoagulant sodium citrate solution 5 mL (has no administration in time range)  alteplase (CATHFLO ACTIVASE) injection 2 mg (has no administration in time range)  heparin injection 20 Units/kg (has no administration in time range)  doxercalciferol (HECTOROL) injection 6 mcg (has no administration in time range)  sevelamer carbonate (RENVELA) tablet 1,600 mg (1,600 mg Oral Patient Refused/Not Given 10/19/22 1639)  heparin injection 5,000 Units (has no administration in time range)  calcium gluconate 1 g/ 50 mL sodium chloride IVPB (0 mg Intravenous Stopped 10/19/22 1753)  albuterol (PROVENTIL) (2.5 MG/3ML) 0.083% nebulizer solution 10 mg (10 mg Nebulization Given 10/19/22 1930)  insulin aspart (novoLOG) injection 5 Units (5 Units Intravenous Given 10/19/22 1647)    And  dextrose 50 % solution 50 mL (50 mLs Intravenous Given 10/19/22 1645)  sodium chloride 0.9 % bolus 500 mL (0 mLs Intravenous Stopped 10/19/22 1753)  acetaminophen (TYLENOL) tablet 325 mg (325 mg Oral Given 10/19/22 1646)  ondansetron (ZOFRAN) injection 4 mg (4 mg Intravenous Given 10/19/22 1644)  iohexol (OMNIPAQUE) 350 MG/ML injection 75 mL (75 mLs Intravenous Contrast Given 10/19/22 1824)    Mobility walks with person assist     Focused Assessments Renal Assessment Handoff:  Hemodialysis Schedule: Hemodialysis Schedule: Monday/Wednesday/Friday Last Hemodialysis date and time:  10/17/23   Restricted appendage:    R Recommendations: See Admitting Provider Note  Report given to:   Additional Notes:

## 2022-10-19 NOTE — Assessment & Plan Note (Signed)
CT abdomen pelvis in ED unremarkable for acute abnormality. I suspect this is secondary to COVID infection and hyperkalemia. Constipation likely contributed though this has improved (s/p 3 BMs in ED w/ improved abdominal pain) - Renal diet as tolerated - Supportive care for COVID symptoms - Zofran 4 mg every 6 hours as needed - CBC, RFP in the morning - Droplet and contact precautions

## 2022-10-19 NOTE — ED Triage Notes (Signed)
Pt reports emesis after eating hot dogs. Pt unsure of fevers at home. Denies pain.

## 2022-10-19 NOTE — ED Provider Notes (Signed)
[place] EMERGENCY DEPARTMENT AT [facility] Provider Note   CSN: [id number] Arrival date & time: 10/19/22  1344     History  Chief Complaint  Patient presents with   Emesis    James Wall is a 57 y.o. male.  Patient with history of GERD, ESRD on hemodialysis, hypertension, CVA presents today with complaints of nausea, vomiting, and abdominal pain. He states that same has been ongoing for 2 days. He thought he might've consumed some bad hotdogs but isnt sure. He was last dialyzed on Friday per his usual schedule. He states that he never completes a full session due to getting severe muscle cramps. He has been on dialysis fro 15 years. He denies any known fevers. No chest pain or shortness of breath. Does not some cough and congestion that are new. No known sick contacts.  His abdominal pain is generalized throughout.  Denies any history of similar symptoms previously.  No history of abdominal surgeries.  The history is provided by the patient. No language interpreter was used.  Emesis Associated symptoms: abdominal pain        Home Medications Prior to Admission medications   Medication Sig Start Date End Date Taking? Authorizing Provider  acetaminophen (TYLENOL) 325 MG tablet Take 2 tablets (650 mg total) by mouth every 4 (four) hours as needed for moderate pain or mild pain. 09/30/21   Angiulli, Mcarthur Rossetti, PA-C  amLODipine (NORVASC) 10 MG tablet Take 1 tablet (10 mg total) by mouth at bedtime. 12/05/21   Christell Constant, MD  cloNIDine (CATAPRES - DOSED IN MG/24 HR) 0.3 mg/24hr patch Place 1 patch (0.3 mg total) onto the skin once a week on Sunday. 01/30/22   Christell Constant, MD  clopidogrel (PLAVIX) 75 MG tablet Take 1 tablet (75 mg total) by mouth daily. 12/05/21   Christell Constant, MD  hydrALAZINE (APRESOLINE) 50 MG tablet Take 1 tablet (50 mg total) by mouth 2 (two) times daily. 12/05/21   Christell Constant, MD  isosorbide dinitrate  (ISORDIL) 20 MG tablet Take 1 tablet (20 mg total) by mouth 2 (two) times daily. 12/05/21   Chandrasekhar, Rondel Jumbo, MD  latanoprost (XALATAN) 0.005 % ophthalmic solution Apply to eye. 04/14/22   [provider]  metoprolol tartrate (LOPRESSOR) 100 MG tablet Take 1 tablet (100 mg total) by mouth 2 (two) times daily. 12/05/21   Chandrasekhar, Rondel Jumbo, MD  oxybutynin (DITROPAN) 5 MG tablet Take 5 mg by mouth 2 (two) times daily as needed for bladder spasms.    [provider]  pantoprazole (PROTONIX) 20 MG tablet TAKE 1 TABLET(20 MG) BY MOUTH DAILY 01/09/22   Shelby Mattocks, DO  rosuvastatin (CRESTOR) 10 MG tablet Take 1 tablet (10 mg total) by mouth daily. 12/05/21   Chandrasekhar, Mahesh A, MD  SENSIPAR 90 MG tablet Take 90 mg by mouth daily. 05/07/22   [provider]  sevelamer carbonate (RENVELA) 800 MG tablet Take 4 tablets (3,200 mg total) by mouth 3 (three) times daily with meals. 09/30/21   Angiulli, Mcarthur Rossetti, PA-C  tamsulosin (FLOMAX) 0.4 MG CAPS capsule TAKE 2 CAPSULES(0.8 MG) BY MOUTH DAILY Patient taking differently: Take 0.4 mg by mouth daily. 04/10/22   Shelby Mattocks, DO      Allergies    Venofer  [ferric oxide] and Aspirin    Review of Systems   Review of Systems  Gastrointestinal:  Positive for abdominal pain, nausea and vomiting.  All other systems reviewed and are negative.  Physical Exam Updated Vital Signs BP (!) 158/132 (BP Location: Right Arm)   Pulse 76   Temp (!) 100.5 F (38.1 C) (Oral)   Resp 18   SpO2 100%  Physical Exam Vitals and nursing note reviewed.  Constitutional:      General: He is not in acute distress.    Appearance: Normal appearance. He is normal weight. He is not ill-appearing, toxic-appearing or diaphoretic.  HENT:     Head: Normocephalic and atraumatic.  Cardiovascular:     Rate and Rhythm: Normal rate and regular rhythm.     Heart sounds: Normal heart sounds.  Pulmonary:     Effort: Pulmonary effort is normal. No  respiratory distress.     Breath sounds: Normal breath sounds.  Chest:     Comments: Port site right upper chest noninfectious appearing, however no overlying dressing or bandage in place Abdominal:     Tenderness: There is generalized abdominal tenderness.  Musculoskeletal:        General: Normal range of motion.     Cervical back: Normal range of motion.  Skin:    General: Skin is warm and dry.  Neurological:     General: No focal deficit present.     Mental Status: He is alert.  Psychiatric:        Mood and Affect: Mood normal.        Behavior: Behavior normal.     ED Results / Procedures / Treatments   Labs (all labs ordered are listed, but only abnormal results are displayed) Labs Reviewed  COMPREHENSIVE METABOLIC PANEL - Abnormal; Notable for the following components:      Result Value   Potassium 6.3 (*)    Chloride 95 (*)    BUN 38 (*)    Creatinine, Ser 12.49 (*)    Alkaline Phosphatase 136 (*)    GFR, Estimated 4 (*)    Anion gap 18 (*)    All other components within normal limits  CBC - Abnormal; Notable for the following components:   RBC 3.83 (*)    Hemoglobin 12.8 (*)    MCV 102.9 (*)    All other components within normal limits  RESP PANEL BY RT-PCR (RSV, FLU A&B, COVID)  RVPGX2  CULTURE, BLOOD (ROUTINE X 2)  CULTURE, BLOOD (ROUTINE X 2)  LIPASE, BLOOD  PROTIME-INR  APTT  I-STAT CG4 LACTIC ACID, ED    EKG None  Radiology DG Chest Port 1 View  Result Date: 10/19/2022 CLINICAL DATA:  Sepsis. Abdominal pain with nausea and vomiting. Chest pain. EXAM: PORTABLE CHEST 1 VIEW COMPARISON:  09/24/2021 FINDINGS: Right IJ central venous catheter tip: SVC. Atherosclerotic calcification of the aortic arch. Mild central airway thickening which may reflect bronchitis or reactive airways disease. Upper normal heart size. No blunting of the costophrenic angles. IMPRESSION: 1. Mild central airway thickening which may reflect bronchitis or reactive airways disease.  2. Right IJ central venous catheter tip: SVC. Electronically Signed   By: Gaylyn Rong M.D.   On: 10/19/2022 15:29    Procedures .Critical Care  Performed by: Silva Bandy, PA-C Authorized by: Silva Bandy, PA-C   Critical care provider statement:    Critical care time (minutes):  30   Critical care start time:  10/19/2022 3:00 PM   Critical care end time:  10/19/2022 3:30 PM   Critical care was necessary to treat or prevent imminent or life-threatening deterioration of the following conditions:  Metabolic crisis and renal failure   Critical  care was time spent personally by me on the following activities:  Development of treatment plan with patient or surrogate, discussions with consultants, discussions with primary provider, evaluation of patient's response to treatment, examination of patient, obtaining history from patient or surrogate, ordering and review of laboratory studies, ordering and review of radiographic studies, pulse oximetry and re-evaluation of patient's condition   Care discussed with: admitting provider       Medications Ordered in ED Medications  sodium zirconium cyclosilicate (LOKELMA) packet 10 g (has no administration in time range)  calcium gluconate 1 g/ 50 mL sodium chloride IVPB (has no administration in time range)  albuterol (PROVENTIL) (2.5 MG/3ML) 0.083% nebulizer solution 10 mg (has no administration in time range)  insulin aspart (novoLOG) injection 5 Units (has no administration in time range)    And  dextrose 50 % solution 50 mL (has no administration in time range)  sodium chloride 0.9 % bolus 500 mL (has no administration in time range)    ED Course/ Medical Decision Making/ A&P                                 Medical Decision Making Amount and/or Complexity of Data Reviewed Labs: ordered. Radiology: ordered. ECG/medicine tests: ordered.  Risk OTC drugs. Prescription drug management.   This patient is a 57 y.o. male who presents  to the ED for concern of nausea, vomiting, abdominal pain, this involves an extensive number of treatment options, and is a complaint that carries with it a high risk of complications and morbidity. The emergent differential diagnosis prior to evaluation includes, but is not limited to,  AAA, gastroenteritis, appendicitis, Bowel obstruction, Bowel perforation. Gastroparesis, DKA, Hernia, Inflammatory bowel disease, mesenteric ischemia, pancreatitis, peritonitis SBP, volvulus.   This is not an exhaustive differential.   Past Medical History / Co-morbidities / Social History: history of GERD, ESRD on hemodialysis, hypertension, CVA  Additional history: Chart reviewed. Pertinent results include: Patient receives dialysis Monday Wednesday Friday   Physical Exam: Physical exam performed. The pertinent findings include: generalized abdominal TTP  Lab Tests: I ordered, and personally interpreted labs.  The pertinent results include:  lactic 2.0 --> 3.2, COVID +, K 6.3.  Kidney function at baseline   Imaging Studies: I ordered imaging studies including CXR, CT abdomen pelvis. I independently visualized and interpreted imaging which showed   CXR: 1. Mild central airway thickening which may reflect bronchitis or reactive airways disease. 2. Right IJ central venous catheter tip: SVC.  CT:  1. No acute CT findings of the abdomen or pelvis to explain pain or emesis. 2. In the peripheral midportion of the left kidney, there is an intermediate attenuation lesion measuring 2.0 x 1.6 cm, with previously reported contrast enhancement, consistent with a small renal cell carcinoma. This is unchanged compared to prior examination dated 04/29/2022. 3. Additional cysts, previously characterized by multiphasic CT as simple and hemorrhagic or proteinaceous cysts, benign. 4. Small bilateral nonobstructive renal calculi. No hydronephrosis. 5. Thickening of the decompressed urinary bladder wall, likely secondary to  chronic outlet obstruction. 6. Mild prostatomegaly with biopsy marking clips or fiducials. 7. Coronary artery disease.   I agree with the radiologist interpretation.   Cardiac Monitoring:  The patient was maintained on a cardiac monitor.  My attending physician Dr. Durwin Nora viewed and interpreted the cardiac monitored which showed an underlying rhythm of: no STEMI, no peaked T waves. I agree with this interpretation.  Medications: I ordered medication including tylenol for fever, albuterol, calcium gluconate, insulin and dextrose, Lokelma, and fluids for hyperkalemia. Zofran for nausea/vomiting. Reevaluation of the patient after these medicines showed that the patient improved. I have reviewed the patients home medicines and have made adjustments as needed.  Consultations Obtained: I requested consultation with the nephrology on call Dr. Ronalee Belts,  and discussed lab and imaging findings as well as pertinent plan - they recommend: they will see the patient, plan for dialysis later today   Disposition: After consideration of the diagnostic results and the patients response to treatment, I feel that patient will require admission for hyperkalemia, COVID, nausea/vomiting, and lactic acidosis.  Discussed with patient is understanding and in agreement with this.  Discussed patient with hospitalist who accepts patient for admission.  I discussed this case with my attending physician Dr. Durwin Nora who cosigned this note including patient's presenting symptoms, physical exam, and planned diagnostics and interventions. Attending physician stated agreement with plan or made changes to plan which were implemented.    Final Clinical Impression(s) / ED Diagnoses Final diagnoses:  COVID  Hyperkalemia  Lactic acidosis  Nausea and vomiting, unspecified vomiting type  Generalized abdominal pain    Rx / DC Orders ED Discharge Orders     None         Vear Clock 10/19/22 1959    Gloris Manchester, MD 10/19/22 2144

## 2022-10-19 NOTE — Hospital Course (Signed)
James Wall is a 57 y.o.male with a history of ESRD on HD MWF, CHF, HTN, stroke, prostate cancer who was admitted to the [facility] Medicine Teaching Service at [facility] for N/V with hyperkalemia and lactic acidosis. His hospital course is detailed below:  Nausea/Vomiting Started 1 day prior to admission. Thought to be secondary to COVID and hyperkalemia. Initially had diffuse abdominal pain and constipation prior to arrival, states this resolved after 3 BM in the ED. CTAP unremarkable for acute abnormality. RUQ [place] with no gallbladder abnormality, echogenic R kidney with numerous renal cysts. Blood cultures with no growth***.   COVID Tested positive in the ED. Symptomatic management.   ESRD MWF HD patient. Received dialysis on admission 9/15.   Other chronic conditions were medically managed with home medications and formulary alternatives as necessary (HTN, HLD, GERD)  PCP Follow-up Recommendations: Oncology follow up outpatient re RCC

## 2022-10-20 ENCOUNTER — Inpatient Hospital Stay (HOSPITAL_COMMUNITY): Payer: 59

## 2022-10-20 DIAGNOSIS — E875 Hyperkalemia: Secondary | ICD-10-CM | POA: Diagnosis not present

## 2022-10-20 DIAGNOSIS — U071 COVID-19: Principal | ICD-10-CM

## 2022-10-20 LAB — RENAL FUNCTION PANEL
Albumin: 3.1 g/dL — ABNORMAL LOW (ref 3.5–5.0)
Anion gap: 13 (ref 5–15)
BUN: 22 mg/dL — ABNORMAL HIGH (ref 6–20)
CO2: 27 mmol/L (ref 22–32)
Calcium: 8.9 mg/dL (ref 8.9–10.3)
Chloride: 93 mmol/L — ABNORMAL LOW (ref 98–111)
Creatinine, Ser: 8.56 mg/dL — ABNORMAL HIGH (ref 0.61–1.24)
GFR, Estimated: 7 mL/min — ABNORMAL LOW (ref >60)
Glucose, Bld: 97 mg/dL (ref 70–99)
Phosphorus: 6.5 mg/dL — ABNORMAL HIGH (ref 2.5–4.6)
Potassium: 4.6 mmol/L (ref 3.5–5.1)
Sodium: 133 mmol/L — ABNORMAL LOW (ref 135–145)

## 2022-10-20 LAB — HEPATIC FUNCTION PANEL
ALT: 9 U/L (ref 0–44)
AST: 20 U/L (ref 15–41)
Albumin: 3.1 g/dL — ABNORMAL LOW (ref 3.5–5.0)
Alkaline Phosphatase: 121 U/L (ref 38–126)
Bilirubin, Direct: 0.1 mg/dL (ref 0.0–0.2)
Indirect Bilirubin: 0.7 mg/dL (ref 0.3–0.9)
Total Bilirubin: 0.8 mg/dL (ref 0.3–1.2)
Total Protein: 6.7 g/dL (ref 6.5–8.1)

## 2022-10-20 LAB — CBC
HCT: 36.5 % — ABNORMAL LOW (ref 39.0–52.0)
Hemoglobin: 12 g/dL — ABNORMAL LOW (ref 13.0–17.0)
MCH: 34.1 pg — ABNORMAL HIGH (ref 26.0–34.0)
MCHC: 32.9 g/dL (ref 30.0–36.0)
MCV: 103.7 fL — ABNORMAL HIGH (ref 80.0–100.0)
Platelets: 138 10*3/uL — ABNORMAL LOW (ref 150–400)
RBC: 3.52 MIL/uL — ABNORMAL LOW (ref 4.22–5.81)
RDW: 13.6 % (ref 11.5–15.5)
WBC: 4.2 10*3/uL (ref 4.0–10.5)
nRBC: 0 % (ref 0.0–0.2)

## 2022-10-20 LAB — TROPONIN I (HIGH SENSITIVITY)
Troponin I (High Sensitivity): 60 ng/L — ABNORMAL HIGH (ref <18)
Troponin I (High Sensitivity): 57 ng/L — ABNORMAL HIGH (ref <18)

## 2022-10-20 LAB — GLUCOSE, CAPILLARY: Glucose-Capillary: 76 mg/dL (ref 70–99)

## 2022-10-20 LAB — GAMMA GT: GGT: 16 U/L (ref 7–50)

## 2022-10-20 LAB — LACTIC ACID, PLASMA: Lactic Acid, Venous: 1 mmol/L (ref 0.5–1.9)

## 2022-10-20 MED ORDER — HEPARIN SODIUM (PORCINE) 1000 UNIT/ML IJ SOLN
INTRAMUSCULAR | Status: AC
  Filled 2022-10-20: qty 4

## 2022-10-20 MED ORDER — GUAIFENESIN ER 600 MG PO TB12
600.0000 mg | ORAL_TABLET | Freq: Once | ORAL | Status: AC
  Administered 2022-10-20: 600 mg via ORAL
  Filled 2022-10-20: qty 1

## 2022-10-20 MED ORDER — METOPROLOL TARTRATE 50 MG PO TABS
100.0000 mg | ORAL_TABLET | Freq: Two times a day (BID) | ORAL | Status: DC
  Administered 2022-10-20 – 2022-10-21 (×3): 100 mg via ORAL
  Filled 2022-10-20 (×4): qty 2

## 2022-10-20 NOTE — Progress Notes (Signed)
FMTS Brief Progress Note  S: Received message from patient's RN that patient was complaining of abdominal pain.  Went to evaluate patient with Dr. Yetta Barre.  Patient complaining of diffuse lower abdominal pain, states that it has been going on since dialysis.  Also reports right-sided chest tightness, feels like his abdominal pain is radiating to his chest.  Notes that he has had 6 episodes of diarrhea since being moved from the ED.  O: BP (!) 163/99 (BP Location: Right Arm)   Pulse 80   Temp 99.7 F (37.6 C) (Oral)   Resp 18   Ht 5\' 5"  (1.651 m)   Wt 90.3 kg   SpO2 100%   BMI 33.12 kg/m   Gen: Uncomfortable appearing, sitting on side of bed CV: RRR Resp: CTAB, no increased work of breathing Abd: Soft, nondistended.  Tenderness to palpation in the lower abdomen, right worse than left.  No TTP RUQ, LUQ, epigastric.   A/P: Abdominal pain Suspect this to be related to diarrhea, which I suspect is in the setting of COVID infection.  Due to slightly elevated alk phos at 136 (baseline <100), will obtain right upper quadrant ultrasound and additional lab work. - tylenol 650mg   - RUQ [place] - gamma GT - hepatic function panel  Chest tightness Could be due to abdominal pain, or COVID infection.  No increased work of breathing on room air.  No respiratory distress. - troponin's - EKG  Harry Bark, DO 10/20/2022, 3:49 AM PGY-1, [facility] Night Resident  Please page [phone] with questions.

## 2022-10-20 NOTE — Progress Notes (Signed)
Consulted Cardiology (Dr. Cristal Deer) regarding elevated trop in pt. She informed us that elevated trops could be seen in ESRD pt's, and that we should continue to trend trops to evaluate for rise in pt's trops. If there is notable uptrend (greater than 20%) of trops, then we should reach back out to Cards for further evaluation. She also recommends getting an updated echo for pt.

## 2022-10-20 NOTE — Progress Notes (Signed)
Daily Progress Note Intern Pager: [phone]  Patient name: James Wall [facility] Medical record number: [id number] Date of birth: 1965-12-04 Age: 57 y.o. Gender: male  Primary Care Provider: Shelby Mattocks, DO Consultants: Nephrology Code Status: Full Code  Pt Overview and Major Events to Date:  9/15: Admitted  Assessment and Plan: Jian Santalucia is a 57 year old male with past medical history ESRD on HD MWF, HTN, stroke, and prostate cancer presenting with nausea and vomiting since yesterday. Assessment & Plan Nausea and vomiting CT abdomen pelvis in ED unremarkable for acute abnormality. Abdominal pain w/ nausea and vomiting could be secondary to COVID infection and hyperkalemia. Constipation likely contributed though this has improved (s/p 9 BMs in ED w/ improved abdominal pain). Can also consider gallbladder pathology given elevated alk phos (however, pt does have hx prostate cancer).  - Renal diet as tolerated - Supportive care for COVID symptoms - Zofran 4 mg every 6 hours as needed - CBC, RFP, GGT, Trop, and hepatic function panel pending Hyperkalemia Likely secondary to incomplete dialysis run 2 days ago. S/p insulin, calcium gluconate, albuterol, Lokelma in ED. K improved 6.3>5.4. No concerning EKG changes. - Admitted to FMTS, attending Dr. Miquel Dunn - Dialysis Wednesday - Nephrology following, appreciate recs - AM RFP and BMP pending COVID-19 COVID test positive in the ED. Patient is overall well-appearing, on room air. LA 2 > 3.2 - Droplet and contact precautions - Supportive care - Tylenol as needed for fever/pain - Repeat LA pending ESRD (end stage renal disease) (HCC) ESRD on HD MWF. Nephrology consulted. - HD MWF - Continue home ESRD medications - AM RFP pending Abnormal finding on CT scan CTAP showed lesion on left kidney read as consistent with small RCC, possible met from prostate cancer per [facility] per office visit note, but will check with  patient or oncology to see if this is of concern. Reassuringly this lesion is reported unchanged compared to imaging on 04/29/2022 however I cannot find this imaging. Unsure if this is a separate cancer or a metastasis from the prostate cancer, can discuss with oncology regarding this.  Chronic and Stable Problems:  HTN - amlodipine 10mg  every day, clonidine 0.3mg  patch once weekly, hydralazine 50mg  BID, metoprolol tartrate 100mg  BID HLD - rosuvastatin 10mg  every day GERD - protonix 20mg  every day Stroke 2023 - plavix 75mg   Prostate cancer - s/p radiation, currently getting hormone therapy   FEN/GI: Renal Diet PPx: Heparin Dispo: pending clinical improvement  Subjective:  Patient is doing well this morning.  No complaints of chest pain, shortness of breath, abdominal pain.  He was getting his right upper quadrant ultrasound this morning.  He reports having 9 bowel movements yesterday and 1 this morning, denies blood.  Objective: Temp:  [98.5 F (36.9 C)-100.5 F (38.1 C)] 99.6 F (37.6 C) (09/16 0818) Pulse Rate:  [60-114] 60 (09/16 0818) Resp:  [10-30] 16 (09/16 0818) BP: (120-171)/(76-132) 140/79 (09/16 0818) SpO2:  [72 %-100 %] 98 % (09/16 0818) Weight:  [90.3 kg] 90.3 kg (09/16 0307) Physical Exam: General: NAD, awake and alert HEENT: Normocephalic, atraumatic. Conjunctiva normal. No nasal discharge Cardiovascular: RRR. No M/R/G Respiratory: CTAB, normal WOB on RA. No wheezing, crackles, rhonchi, or diminished breath sounds. Abdomen: Soft, non-tender, non-distended. Bowel sounds normoactive Extremities: No BLE edema, no deformities or significant joint findings. Skin: Warm and dry.  Laboratory: Most recent CBC Lab Results  Component Value Date   WBC 4.9 10/19/2022   HGB 11.3 (L) 10/19/2022  HCT 36.0 (L) 10/19/2022   MCV 103.7 (H) 10/19/2022   PLT 142 (L) 10/19/2022   Most recent BMP    Latest Ref Rng & Units 10/19/2022    7:28 PM  BMP  Glucose 70 - 99 mg/dL 65    BUN 6 - 20 mg/dL 39   Creatinine 6.96 - 1.24 mg/dL 29.52   Sodium 841 - 324 mmol/L 134   Potassium 3.5 - 5.1 mmol/L 5.4   Chloride 98 - 111 mmol/L 95   CO2 22 - 32 mmol/L 25   Calcium 8.9 - 10.3 mg/dL 8.9     Imaging/Diagnostic Tests: RUQ [place] 1. Normal sonographic appearance of the gallbladder. 2. Echogenic right kidney with numerous renal cysts partially visualized, correlate with recently performed abdomen and pelvis CT.   Fortunato Curling, DO 10/20/2022, 11:11 AM PGY-1, [facility]  FPTS Intern pager: [phone], text pages welcome Secure chat group [facility] Teaching Service

## 2022-10-20 NOTE — Progress Notes (Addendum)
FMTS Brief Progress Note  S:Evaluated with Dr. Royal Piedra.  Resting in bed, no new complaints at this time.  States that his chest tightness has resolved.  His nausea, vomiting, diarrhea has improved some.  He is still having a productive cough.   O: BP 112/76 (BP Location: Right Arm)   Pulse 65   Temp 98.9 F (37.2 C) (Oral)   Resp 16   Ht 5\' 5"  (1.651 m)   Wt 90.3 kg   SpO2 100%   BMI 33.12 kg/m   Gen: well appearing, much more comfortable than when I saw him on admission, no acute distress CV: RRR, no murmurs Pulm: CTAB, no increased work of breathing, saturating well on RA  A/P: Nausea/Vomiting, Covid -Symptoms thought to be related to COVID infection -Blood cx NG x24h -Symptoms have improved, continue supportive care -Zofran 4 mg every 6 hours as needed -Tylenol as needed for fever/pain -Mucinex given for cough  Hyperkalemia -Potassium 4.6 this morning, much improved from 6.3 on admission -Continue MWF dialysis -AM RFP and BMP  - Orders reviewed. Labs for AM ordered, which was adjusted as needed.  - If condition changes, plan includes better symptomatic control with meds.   Scharlene Catalina, DO 10/20/2022, 9:15 PM PGY-1, [place] Night Resident  Please page [phone] with questions.

## 2022-10-20 NOTE — Progress Notes (Signed)
Admit: 10/19/2022 LOS: 1  30M ESRD MWF with N/V/D and COVID19 infection, hyperkalemia  Subjective:  HD overnight with 0.8 L UF, finished early for clotting Patient still complaining of diarrhea reports 9 episodes since admission No respiratory symptoms or other complaints right now  09/15 0701 - 09/16 0700 In: 120 [P.O.:120] Out: 800   Filed Weights   10/20/22 0307  Weight: 90.3 kg    Scheduled Meds:  amLODipine  10 mg Oral QHS   Chlorhexidine Gluconate Cloth  6 each Topical Q0600   cinacalcet  90 mg Oral Q supper   clopidogrel  75 mg Oral Daily   doxercalciferol  6 mcg Intravenous Q M,W,F-HD   heparin  5,000 Units Subcutaneous Q8H   heparin sodium (porcine)       hydrALAZINE  50 mg Oral BID   hydrOXYzine  25 mg Oral Daily   isosorbide dinitrate  20 mg Oral BID   latanoprost  1 drop Both Eyes QHS   nicotine  14 mg Transdermal Daily   pantoprazole  20 mg Oral Daily   rosuvastatin  10 mg Oral Daily   sevelamer carbonate  1,600 mg Oral TID WC   sodium zirconium cyclosilicate  10 g Oral Daily   tamsulosin  0.4 mg Oral Daily   Continuous Infusions: PRN Meds:.acetaminophen, heparin sodium (porcine), ondansetron (ZOFRAN) IV  Current Labs: reviewed    Physical Exam:  Blood pressure (!) 140/79, pulse 60, temperature 99.6 F (37.6 C), temperature source Oral, resp. rate 16, height 5\' 5"  (1.651 m), weight 90.3 kg, SpO2 98%. NAD, lying flat in bed comfortable appearing Regular with no murmur or rub Right IJ TDC with clean exit site Clear bilaterally Quiet bowel sounds, soft, nontender No peripheral edema Nonfocal, CN II through XII grossly intact, oriented X3  OP HD orders:  Dialyzes at [facility]. MWF, 4 hours, EDW: 87 kg. HD Bath 2k 2ca , Access: RIJ TDC Hectorol 6 mcg 3x/week  A COVID-19 infection with predominant GI symptoms, imaging negative.  Currently with supportive care per primary ESRD MWF, HD overnight 9/15-16, tentative next HD 9/18 Hyperkalemia status post  dialysis, trend; currently on Lokelma but does not take as an outpatient Hypertension, follow while on outpatient regimen Anemia, hemoglobin stable BMD: Continue Renvela and VDRA, trend Ca and P  P As above Medication Issues; Preferred narcotic agents for pain control are hydromorphone, fentanyl, and methadone. Morphine should not be used.  Baclofen should be avoided Avoid oral sodium phosphate and magnesium citrate based laxatives / bowel preps    Sabra Heck MD 10/20/2022, 9:35 AM  Recent Labs  Lab 10/19/22 1400 10/19/22 1928  NA 137 134*  K 6.3* 5.4*  CL 95* 95*  CO2 24 25  GLUCOSE 87 65*  BUN 38* 39*  CREATININE 12.49* 12.59*  CALCIUM 9.5 8.9  PHOS  --  4.5   Recent Labs  Lab 10/19/22 1400 10/19/22 1928  WBC 5.4 4.9  HGB 12.8* 11.3*  HCT 39.4 36.0*  MCV 102.9* 103.7*  PLT 162 142*

## 2022-10-20 NOTE — Assessment & Plan Note (Addendum)
ESRD on HD MWF. Nephrology consulted. - HD MWF - Continue home ESRD medications - AM RFP pending

## 2022-10-20 NOTE — Progress Notes (Signed)
Patient reported that his eyeglasses are missing. Patient last remembers having them on in the ED before being transported. ED stated they would look around for them.

## 2022-10-20 NOTE — Assessment & Plan Note (Addendum)
COVID test positive in the ED. Patient is overall well-appearing, on room air. LA 2 > 3.2 - Droplet and contact precautions - Supportive care - Tylenol as needed for fever/pain - Repeat LA pending

## 2022-10-20 NOTE — Plan of Care (Signed)
  Problem: Education: Goal: Knowledge of General Education information will improve Description: Including pain rating scale, medication(s)/side effects and non-pharmacologic comfort measures Outcome: Progressing   Problem: Health Behavior/Discharge Planning: Goal: Ability to manage health-related needs will improve Outcome: Progressing   Problem: Clinical Measurements: Goal: Ability to maintain clinical measurements within normal limits will improve Outcome: Progressing   Problem: Clinical Measurements: Goal: Will remain free from infection Outcome: Progressing   Problem: Clinical Measurements: Goal: Diagnostic test results will improve Outcome: Progressing   Problem: Clinical Measurements: Goal: Respiratory complications will improve Outcome: Progressing   Problem: Activity: Goal: Risk for activity intolerance will decrease Outcome: Progressing   Problem: Nutrition: Goal: Adequate nutrition will be maintained Outcome: Progressing   Problem: Pain Managment: Goal: General experience of comfort will improve Outcome: Progressing

## 2022-10-20 NOTE — Progress Notes (Signed)
Called lab as pt troponins not drawn despite being ordered this AM when pt having episode of chest pain. Other lab work recently drawn but not troponin. Discussed order replaced at 8 am stat this morning when it had noted to be retimed for 10 am. Lab notes that they see order and will call floor to have 2W phlebotomist draw this.  Thankfully, pt seen this AM without chest pain, abdominal pain, all resolved. He denies any shortness of breath. EKG reviewed showing TWI present on prior EKG. No hemoptysis. NO leg swelling or recent travel. See H&P cosign for complete thoughts.   Burley Saver MD

## 2022-10-20 NOTE — Progress Notes (Signed)
Received patient in stretcher  to unit.  Alert and oriented.  Informed consent signed and in chart.   TX duration:2:38 hours  Patient not tolerated well.  Transported back to the room  Alert, without acute distress but complaints of abdominal pain. Hand-off given to patient's nurse.   Access used: catheter Access issues: none  Total UF removed: 800 mls Medication(s) given: none Post HD VS: 171/102 Post HD weight: stretcher, unable to obtain   10/20/22 0111  Vitals  Temp 98.9 F (37.2 C)  Temp Source Oral  BP (!) 171/102  MAP (mmHg) 122  BP Location Right Arm  BP Method Automatic  Patient Position (if appropriate) Lying  Pulse Rate 73  Pulse Rate Source Monitor  ECG Heart Rate 72  Resp (!) 22  Oxygen Therapy  SpO2 100 %  O2 Device Room Air  Patient Activity (if Appropriate) In bed  Pulse Oximetry Type Continuous  During Treatment Monitoring  Duration of HD Treatment -hour(s) 2.38 hour(s)  Cumulative Fluid Removed (mL) per Treatment  800  Post Treatment  Dialyzer Clearance Lightly streaked  Hemodialysis Intake (mL) 0 mL  Liters Processed 61.7  Fluid Removed (mL) 800 mL  Tolerated HD Treatment No (Comment)  Post-Hemodialysis Comments HD tx not completed as expected due to clotting and  pt refused to go back on the machine,  pt stated that he was done. complaints of abdominal pain.  pt is stable.  AVG/AVF Arterial Site Held (minutes) 0 minutes  AVG/AVF Venous Site Held (minutes) 0 minutes  Note  Patient Observations pt is stable  Hemodialysis Catheter Right Internal jugular Double lumen Permanent (Tunneled)  Placement Date/Time: 09/24/21 1457   Placed prior to admission: No  Time Out: Correct patient;Correct site;Correct procedure  Maximum sterile barrier precautions: Hand hygiene;Sterile gloves;Cap;Large sterile sheet;Mask;Sterile gown  Site Prep: Chlorh...  Site Condition No complications  Blue Lumen Status Flushed;Heparin locked;Dead end cap in place  Red Lumen  Status Flushed;Heparin locked;Dead end cap in place  Purple Lumen Status N/A  Catheter fill solution Heparin 1000 units/ml  Catheter fill volume (Arterial) 1.6 cc  Catheter fill volume (Venous) 1.6  Dressing Type Transparent  Dressing Status Antimicrobial disc in place  Interventions Dressing changed  Drainage Description None  Post treatment catheter status Capped and Clamped

## 2022-10-20 NOTE — Progress Notes (Signed)
Pt receives out-pt HD at [facility] on MWF. Clinic advised pt is covid positive. Will assist as needed.   Olivia Canter Renal Navigator [phone]

## 2022-10-20 NOTE — Assessment & Plan Note (Addendum)
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 Reassuringly this lesion is reported unchanged compared to imaging on 04/29/2022 however I cannot find this imaging. Unsure if this is a separate cancer or a metastasis from the prostate cancer, can discuss with oncology regarding this.

## 2022-10-20 NOTE — Assessment & Plan Note (Signed)
>>  ASSESSMENT AND PLAN FOR ESRD (END STAGE RENAL DISEASE) (HCC) WRITTEN ON 10/20/2022 11:11 AM BY GOMES, ADRIANA, DO  ESRD on HD MWF. Nephrology consulted. - HD MWF - Continue home ESRD medications - AM RFP pending

## 2022-10-20 NOTE — Assessment & Plan Note (Addendum)
CT abdomen pelvis in ED unremarkable for acute abnormality. Abdominal pain w/ nausea and vomiting could be secondary to COVID infection and hyperkalemia. Constipation likely contributed though this has improved (s/p 9 BMs in ED w/ improved abdominal pain). Can also consider gallbladder pathology given elevated alk phos (however, pt does have hx prostate cancer).  - Renal diet as tolerated - Supportive care for COVID symptoms - Zofran 4 mg every 6 hours as needed - CBC, RFP, GGT, Trop, and hepatic function panel pending

## 2022-10-20 NOTE — Assessment & Plan Note (Addendum)
Likely secondary to incomplete dialysis run 2 days ago. S/p insulin, calcium gluconate, albuterol, Lokelma in ED. K improved 6.3>5.4. No concerning EKG changes. - Admitted to FMTS, attending Dr. Miquel Dunn - Dialysis Wednesday - Nephrology following, appreciate recs - AM RFP and BMP pending

## 2022-10-20 NOTE — Plan of Care (Signed)

## 2022-10-21 ENCOUNTER — Inpatient Hospital Stay (HOSPITAL_COMMUNITY): Payer: 59

## 2022-10-21 ENCOUNTER — Other Ambulatory Visit (HOSPITAL_COMMUNITY): Payer: 59

## 2022-10-21 DIAGNOSIS — R079 Chest pain, unspecified: Secondary | ICD-10-CM

## 2022-10-21 DIAGNOSIS — U071 COVID-19: Secondary | ICD-10-CM | POA: Diagnosis not present

## 2022-10-21 DIAGNOSIS — R7989 Other specified abnormal findings of blood chemistry: Secondary | ICD-10-CM | POA: Diagnosis not present

## 2022-10-21 DIAGNOSIS — R197 Diarrhea, unspecified: Secondary | ICD-10-CM | POA: Insufficient documentation

## 2022-10-21 DIAGNOSIS — E872 Acidosis, unspecified: Secondary | ICD-10-CM | POA: Diagnosis not present

## 2022-10-21 DIAGNOSIS — E875 Hyperkalemia: Secondary | ICD-10-CM | POA: Diagnosis not present

## 2022-10-21 LAB — ECHOCARDIOGRAM COMPLETE
AR max vel: 1.79 cm2
AV Area VTI: 2.1 cm2
AV Area mean vel: 1.7 cm2
AV Mean grad: 9 mmHg
AV Peak grad: 14.1 mmHg
Ao pk vel: 1.88 m/s
Area-P 1/2: 1.79 cm2
Height: 65 in
S' Lateral: 2.8 cm
Weight: 3184 [oz_av]

## 2022-10-21 LAB — CBC
HCT: 34.8 % — ABNORMAL LOW (ref 39.0–52.0)
Hemoglobin: 11.2 g/dL — ABNORMAL LOW (ref 13.0–17.0)
MCH: 33 pg (ref 26.0–34.0)
MCHC: 32.2 g/dL (ref 30.0–36.0)
MCV: 102.7 fL — ABNORMAL HIGH (ref 80.0–100.0)
Platelets: 119 10*3/uL — ABNORMAL LOW (ref 150–400)
RBC: 3.39 MIL/uL — ABNORMAL LOW (ref 4.22–5.81)
RDW: 13.5 % (ref 11.5–15.5)
WBC: 4.2 10*3/uL (ref 4.0–10.5)
nRBC: 0 % (ref 0.0–0.2)

## 2022-10-21 LAB — RENAL FUNCTION PANEL
Albumin: 2.8 g/dL — ABNORMAL LOW (ref 3.5–5.0)
Anion gap: 13 (ref 5–15)
BUN: 36 mg/dL — ABNORMAL HIGH (ref 6–20)
CO2: 27 mmol/L (ref 22–32)
Calcium: 8.4 mg/dL — ABNORMAL LOW (ref 8.9–10.3)
Chloride: 93 mmol/L — ABNORMAL LOW (ref 98–111)
Creatinine, Ser: 11.09 mg/dL — ABNORMAL HIGH (ref 0.61–1.24)
GFR, Estimated: 5 mL/min — ABNORMAL LOW (ref >60)
Glucose, Bld: 84 mg/dL (ref 70–99)
Phosphorus: 7.3 mg/dL — ABNORMAL HIGH (ref 2.5–4.6)
Potassium: 5.1 mmol/L (ref 3.5–5.1)
Sodium: 133 mmol/L — ABNORMAL LOW (ref 135–145)

## 2022-10-21 LAB — HEPATITIS B SURFACE ANTIBODY, QUANTITATIVE: Hep B S AB Quant (Post): 10007 m[IU]/mL

## 2022-10-21 MED ORDER — GUAIFENESIN ER 600 MG PO TB12
600.0000 mg | ORAL_TABLET | Freq: Two times a day (BID) | ORAL | Status: DC
  Administered 2022-10-21: 600 mg via ORAL
  Filled 2022-10-21: qty 1

## 2022-10-21 MED ORDER — CHLORHEXIDINE GLUCONATE CLOTH 2 % EX PADS: 6.0000 | MEDICATED_PAD | Freq: Every day | CUTANEOUS | Status: DC

## 2022-10-21 MED ORDER — SODIUM ZIRCONIUM CYCLOSILICATE 10 G PO PACK: 10.0000 g | PACK | Freq: Every day | ORAL | Status: DC

## 2022-10-21 NOTE — Assessment & Plan Note (Signed)
ESRD on HD MWF. Nephrology consulted. - HD MWF - Continue home ESRD medications - AM RFP pending

## 2022-10-21 NOTE — Assessment & Plan Note (Signed)
Patient continues to have bouts of diarrhea. Likely related to his COVID illness. Number of bowel movements have improved since yesterday (10) to 3 BMs today. - GIPP - Consider Loperamide if GIPP negative

## 2022-10-21 NOTE — Progress Notes (Addendum)
Daily Progress Note Intern Pager: [phone]  Patient name: James Wall Medical record number: [id number] Date of birth: 19-Sep-1965 Age: 57 y.o. Gender: male  Primary Care Provider: Shelby Mattocks, DO Consultants: Nephrology Code Status: Full Code  Pt Overview and Major Events to Date:  9/15: Admitted  Assessment and Plan: James Wall is a 57 year old male with past medical history ESRD on HD MWF, HTN, stroke, and prostate cancer presenting with nausea and vomiting.  Assessment & Plan Nausea and vomiting CT abdomen pelvis in ED unremarkable for acute abnormality. Abdominal pain w/ nausea and vomiting could be secondary to COVID infection and hyperkalemia. Constipation likely contributed though this has improved (s/p 3 BMs this AM w/ improved abdominal pain).  - Renal diet as tolerated - Supportive care for COVID symptoms - Zofran 4 mg every 6 hours as needed - AM CBC and RFP Hyperkalemia Likely secondary to incomplete dialysis run 2 days ago. S/p insulin, calcium gluconate, albuterol, Lokelma in ED. K improved 6.3>5.4>4.6>5.1. No concerning EKG changes. - Lokelma 10 g daily - Dialysis Wednesday - Nephrology following, appreciate recs - AM RFP and BMP pending COVID-19 COVID test positive in the ED. Patient is overall well-appearing, on room air. 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 Reassuringly this lesion is reported unchanged compared to imaging on 04/29/2022 however I cannot find this imaging. Unsure if this is a separate cancer or a metastasis from the prostate cancer, can discuss with oncology regarding this. - Will call outpatient oncology to obtain  clarification Diarrhea Patient continues to have bouts of diarrhea. Likely related to his COVID illness. Number of bowel movements have improved since yesterday (10) to 3 BMs today. - GIPP - Consider Loperamide if GIPP negative ESRD (end stage renal disease) (HCC) ESRD on HD MWF. Nephrology consulted. - HD MWF - Continue home ESRD medications - AM RFP pending  Chronic and Stable Problems:  HTN - amlodipine 10mg  every day, clonidine 0.3mg  patch once weekly, hydralazine 50mg  BID, metoprolol tartrate 100mg  BID HLD - rosuvastatin 10mg  every day GERD - protonix 20mg  every day Stroke 2023 - plavix 75mg   Prostate cancer - s/p radiation, currently getting hormone therapy    FEN/GI: Renal Diet PPx: Heparin Dispo: pending clinical improvement  Subjective:  Patient is doing well this morning and reports that he had 3 bowel movements this morning, was asking for Imodium this morning, but discussed that we may need to workup further if his symptoms continue and worsen, but likely related to his COVID. Denies any other symptoms today.   Objective: Temp:  [98.6 F (37 C)-100.2 F (37.9 C)] 98.6 F (37 C) (09/17 0729) Pulse Rate:  [55-67] 55 (09/17 0729) Resp:  [16-18] 18 (09/17 0614) BP: (112-154)/(60-83) 128/75 (09/17 0729) SpO2:  [96 %-100 %] 100 % (09/17 0729) Physical Exam: General: NAD, awake and alert HEENT: Normocephalic, atraumatic. Conjunctiva normal. No nasal discharge Cardiovascular: RRR. No M/R/G Respiratory: CTAB, normal WOB on RA. No wheezing, crackles, rhonchi, or diminished breath sounds. Abdomen: Soft, non-tender, non-distended. Bowel sounds normoactive Extremities: No BLE edema, no deformities or significant joint findings. Skin: Warm and dry. Neuro: A&Ox3. No focal neurological deficits.  Laboratory: Most recent  CBC Lab Results  Component Value Date   WBC 4.2 10/21/2022   HGB 11.2 (L) 10/21/2022   HCT 34.8 (L) 10/21/2022   MCV 102.7 (H) 10/21/2022   PLT 119 (L)  10/21/2022   Most recent BMP    Latest Ref Rng & Units 10/21/2022    7:18 AM  BMP  Glucose 70 - 99 mg/dL 84   BUN 6 - 20 mg/dL 36   Creatinine 3.66 - 1.24 mg/dL 44.03   Sodium 474 - 259 mmol/L 133   Potassium 3.5 - 5.1 mmol/L 5.1   Chloride 98 - 111 mmol/L 93   CO2 22 - 32 mmol/L 27   Calcium 8.9 - 10.3 mg/dL 8.4    Imaging/Diagnostic Tests: No new imaging.  Fortunato Curling, DO 10/21/2022, 1:52 PM  PGY-1, [facility] FPTS Intern pager: [phone], text pages welcome Secure chat group [facility] Teaching Service

## 2022-10-21 NOTE — Progress Notes (Addendum)
Admit: 10/19/2022 LOS: 2  46M ESRD MWF with N/V/D and COVID19 infection, hyperkalemia  Subjective: no c/o's this am. Diarrhea maybe a bit better. In good spirits. Fevers remain up.   09/16 0701 - 09/17 0700 In: 240 [P.O.:240] Out: -   Filed Weights   10/20/22 0307  Weight: 90.3 kg    Scheduled Meds:  amLODipine  10 mg Oral QHS   Chlorhexidine Gluconate Cloth  6 each Topical Q0600   cinacalcet  90 mg Oral Q supper   clopidogrel  75 mg Oral Daily   doxercalciferol  6 mcg Intravenous Q M,W,F-HD   heparin  5,000 Units Subcutaneous Q8H   hydrALAZINE  50 mg Oral BID   hydrOXYzine  25 mg Oral Daily   isosorbide dinitrate  20 mg Oral BID   latanoprost  1 drop Both Eyes QHS   metoprolol tartrate  100 mg Oral BID   nicotine  14 mg Transdermal Daily   pantoprazole  20 mg Oral Daily   rosuvastatin  10 mg Oral Daily   sevelamer carbonate  1,600 mg Oral TID WC   [START ON 10/22/2022] sodium zirconium cyclosilicate  10 g Oral Daily   tamsulosin  0.4 mg Oral Daily   Continuous Infusions: PRN Meds:.acetaminophen, ondansetron (ZOFRAN) IV  Current Labs: reviewed    Physical Exam:  Blood pressure 128/75, pulse (!) 55, temperature 98.6 F (37 C), temperature source Oral, resp. rate 18, height 5\' 5"  (1.651 m), weight 90.3 kg, SpO2 100%. NAD, lying flat in bed comfortable appearing Regular with no murmur or rub Right IJ TDC with clean exit site Clear bilaterally Quiet bowel sounds, soft, nontender No peripheral edema Nonfocal, CN II through XII grossly intact, oriented X3  [facility] HD: [facility]    4h  87kg  2/2 bath  RIJ TDC  heparin 4500 + 2000 midrun - hectorol 6 mcg IV three times per week   Assessment/ Plan COVID-19 infection - with predominant GI symptoms, imaging negative.  Currently with supportive care per primary ESRD - is on MWF HD. Had HD here 9/16, next HD tomorrow.  Hyperkalemia - mostly resolved w/ HD.  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  BMD: Continue Renvela and VDRA, trend Ca and P   James Arlean Hopping  MD  [facility] 10/21/2022, 3:13 PM  Recent Labs  Lab 10/20/22 1054 10/21/22 0718  HGB 12.0* 11.2*  ALBUMIN 3.1*  3.1* 2.8*  CALCIUM 8.9 8.4*  PHOS 6.5* 7.3*  CREATININE 8.56* 11.09*  K 4.6 5.1    Inpatient medications:  amLODipine  10 mg Oral QHS   Chlorhexidine Gluconate Cloth  6 each Topical Q0600   cinacalcet  90 mg Oral Q supper   clopidogrel  75 mg Oral Daily   doxercalciferol  6 mcg Intravenous Q M,W,F-HD   heparin  5,000 Units Subcutaneous Q8H   hydrALAZINE  50 mg Oral BID   hydrOXYzine  25 mg Oral Daily   isosorbide dinitrate  20 mg Oral BID   latanoprost  1 drop Both Eyes QHS   metoprolol tartrate  100 mg Oral BID   nicotine  14 mg Transdermal Daily   pantoprazole  20 mg Oral Daily   rosuvastatin  10 mg Oral Daily   sevelamer carbonate  1,600 mg Oral TID WC   [START ON 10/22/2022] sodium zirconium cyclosilicate  10 g Oral Daily   tamsulosin  0.4 mg Oral Daily    acetaminophen, ondansetron (ZOFRAN)  IV

## 2022-10-21 NOTE — Assessment & Plan Note (Addendum)
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 Reassuringly this lesion is reported unchanged compared to imaging on 04/29/2022 however I cannot find this imaging. Unsure if this is a separate cancer or a metastasis from the prostate cancer, can discuss with oncology regarding this. - Will call outpatient oncology to obtain clarification

## 2022-10-21 NOTE — Assessment & Plan Note (Addendum)
COVID test positive in the ED. Patient is overall well-appearing, on room air. LA 2>3.2>1.0 - Droplet and contact precautions - Supportive care - Tylenol as needed for fever/pain

## 2022-10-21 NOTE — Progress Notes (Signed)
Echocardiogram 2D Echocardiogram has been performed.  Warren Lacy Burke Terry RDCS 10/21/2022, 11:42 AM

## 2022-10-21 NOTE — Plan of Care (Signed)

## 2022-10-21 NOTE — Assessment & Plan Note (Addendum)
Likely secondary to incomplete dialysis run 2 days ago. S/p insulin, calcium gluconate, albuterol, Lokelma in ED. K improved 6.3>5.4>4.6>5.1. No concerning EKG changes. - Lokelma 10 g daily - Dialysis Wednesday - Nephrology following, appreciate recs - AM RFP and BMP pending

## 2022-10-21 NOTE — Plan of Care (Signed)
  Problem: Education: Goal: Knowledge of General Education information will improve Description: Including pain rating scale, medication(s)/side effects and non-pharmacologic comfort measures Outcome: Progressing   Problem: Health Behavior/Discharge Planning: Goal: Ability to manage health-related needs will improve Outcome: Progressing   Problem: Clinical Measurements: Goal: Ability to maintain clinical measurements within normal limits will improve Outcome: Progressing   Problem: Clinical Measurements: Goal: Will remain free from infection Outcome: Progressing   Problem: Clinical Measurements: Goal: Diagnostic test results will improve Outcome: Progressing   Problem: Clinical Measurements: Goal: Respiratory complications will improve Outcome: Progressing   Problem: Pain Managment: Goal: General experience of comfort will improve Outcome: Progressing

## 2022-10-21 NOTE — Progress Notes (Addendum)
Sent message to the oncology NP that seems to have seen him before in outpatient setting about possible RCC vs metastasis of prostate cancer as we are unable to tell based on note review. Will await response.   Alfredo Martinez, MD   Discussed with Dr. Kathrynn Running and Marcello Fennel, PA-C regarding his oncology workup and treatment. His imaging since 2019 demonstrates these renal lesions in question and have only slightly enlarged in 2023, possibly due to indolent RCC and the lesions seem unchanged since prior exam to now. Urology doesn't seem to have committed that it is RCC, but could be a proteinaceous renal cyst or other benign renal lesion, reassured by the minimal change in the past 5 years. Of note, patient completed radiation 09/04/21 as well. His prostate cancer is likely controlled by his hormone therapy at this time and there is low suspicion of metastasis to the kidney. Could check a PSA and testosterone test per Dr. Kathrynn Running to verify if the prostate cancer is inactive (ideally would expect both to be low). Upon discharge, may need to follow up with his urologist Modena Slater at [facility]. We appreciate the assistance and time of Dr. Kathrynn Running and Lebron Quam with this patient's care.  Fortunato Curling, DO

## 2022-10-21 NOTE — Assessment & Plan Note (Signed)
>>  ASSESSMENT AND PLAN FOR ESRD (END STAGE RENAL DISEASE) (HCC) WRITTEN ON 10/21/2022  7:11 AM BY GOMES, ADRIANA, DO  ESRD on HD MWF. Nephrology consulted. - HD MWF - Continue home ESRD medications - AM RFP pending

## 2022-10-21 NOTE — Assessment & Plan Note (Addendum)
CT abdomen pelvis in ED unremarkable for acute abnormality. Abdominal pain w/ nausea and vomiting could be secondary to COVID infection and hyperkalemia. Constipation likely contributed though this has improved (s/p 3 BMs this AM w/ improved abdominal pain).  - Renal diet as tolerated - Supportive care for COVID symptoms - Zofran 4 mg every 6 hours as needed - AM CBC and RFP

## 2022-10-22 DIAGNOSIS — R112 Nausea with vomiting, unspecified: Secondary | ICD-10-CM

## 2022-10-22 MED ORDER — NICOTINE 14 MG/24HR TD PT24: 14.0000 mg | MEDICATED_PATCH | TRANSDERMAL | 0 refills | Status: DC

## 2022-10-22 MED ORDER — PENTAFLUOROPROP-TETRAFLUOROETH EX AERO: 1.0000 | INHALATION_SPRAY | CUTANEOUS | Status: DC | PRN

## 2022-10-22 MED ORDER — HEPARIN SODIUM (PORCINE) 1000 UNIT/ML DIALYSIS: 1000.0000 [IU] | INTRAMUSCULAR | Status: DC | PRN

## 2022-10-22 MED ORDER — HEPARIN SODIUM (PORCINE) 1000 UNIT/ML IJ SOLN
INTRAMUSCULAR | Status: AC
  Filled 2022-10-22: qty 10

## 2022-10-22 MED ORDER — ANTICOAGULANT SODIUM CITRATE 4% (200MG/5ML) IV SOLN: 5.0000 mL | Status: DC | PRN

## 2022-10-22 MED ORDER — HEPARIN SODIUM (PORCINE) 1000 UNIT/ML DIALYSIS: 2000.0000 [IU] | INTRAMUSCULAR | Status: DC | PRN

## 2022-10-22 MED ORDER — LIDOCAINE-PRILOCAINE 2.5-2.5 % EX CREA: 1.0000 | TOPICAL_CREAM | CUTANEOUS | Status: DC | PRN

## 2022-10-22 MED ORDER — GUAIFENESIN ER 600 MG PO TB12: 600.0000 mg | ORAL_TABLET | Freq: Two times a day (BID) | ORAL | Status: DC

## 2022-10-22 MED ORDER — LIDOCAINE HCL (PF) 1 % IJ SOLN: 5.0000 mL | INTRAMUSCULAR | Status: DC | PRN

## 2022-10-22 MED ORDER — ALTEPLASE 2 MG IJ SOLR: 2.0000 mg | Freq: Once | INTRAMUSCULAR | Status: DC | PRN

## 2022-10-22 MED ORDER — HEPARIN SODIUM (PORCINE) 1000 UNIT/ML DIALYSIS: 4000.0000 [IU] | Freq: Once | INTRAMUSCULAR | Status: DC

## 2022-10-22 NOTE — Progress Notes (Signed)
   10/22/22 1716  Vitals  BP (!) 132/92  MAP (mmHg) 106  BP Location Right Arm  BP Method Automatic  Patient Position (if appropriate) Lying  Pulse Rate 69  Pulse Rate Source Monitor  Resp 18  During Treatment Monitoring  Intra-Hemodialysis Comments Tx completed  Dialysis Fluid Bolus Normal Saline  Bolus Amount (mL) 300 mL  Post Treatment  Dialyzer Clearance Lightly streaked  Hemodialysis Intake (mL) 0 mL  Liters Processed 63.7  Fluid Removed (mL) 2100 mL  Tolerated HD Treatment No (Comment) (ended early due to cramping no other problems to note)  Post-Hemodialysis Comments hd stopped early due to cramping per pt request  Hemodialysis Catheter Right Internal jugular Double lumen Permanent (Tunneled)  Placement Date/Time: 09/24/21 1457   Placed prior to admission: No  Time Out: Correct patient;Correct site;Correct procedure  Maximum sterile barrier precautions: Hand hygiene;Sterile gloves;Cap;Large sterile sheet;Mask;Sterile gown  Site Prep: Chlorh...  Site Condition No complications  Blue Lumen Status Flushed;Heparin locked  Red Lumen Status Flushed;Heparin locked  Purple Lumen Status N/A  Catheter fill solution Heparin 1000 units/ml  Catheter fill volume (Arterial) 1.6 cc  Catheter fill volume (Venous) 1.6  Dressing Type Transparent  Dressing Status Antimicrobial disc in place  Interventions New dressing  Drainage Description None  Post treatment catheter status Capped and Clamped    Alert and oriented.  Informed consent signed and in chart.   TX duration:3  Patient tolerated well.   Alert, without acute distress.  Hand-off given to patient's nurse.   Access used: rt hd cath Access issues: none  Total UF removed: 2100 Medication(s) given: hectoral Post HD VS: see above Post HD weight: 83.8   [facility]

## 2022-10-22 NOTE — Assessment & Plan Note (Signed)
>>  ASSESSMENT AND PLAN FOR ESRD (END STAGE RENAL DISEASE) (HCC) WRITTEN ON 10/22/2022  1:47 PM BY GOMES, ADRIANA, DO  ESRD on HD MWF. Nephrology consulted. - HD MWF - Continue home ESRD medications - AM RFP

## 2022-10-22 NOTE — Progress Notes (Signed)
Spoke to pt via phone to make pt aware that he will need to enter/exit out-pt HD clinic from the side entrance due to covid diagnosis. Pt voices understanding. Pt states he may d/c later today. Will contact pt's clinic once d/c date is confirmed.   Olivia Canter Renal Navigator [phone]

## 2022-10-22 NOTE — Progress Notes (Signed)
Daily Progress Note Intern Pager: [phone]  Patient name: James Wall [facility] Medical record number: [id number] Date of birth: August 30, 1965 Age: 57 y.o. Gender: male  Primary Care Provider: Shelby Mattocks, DO Consultants: Nephrology Code Status: Full Code  Pt Overview and Major Events to Date:  9/15: Admitted  Assessment and Plan: James Wall is a 57 year old male with past medical history ESRD on HD MWF, HTN, stroke, and prostate cancer presenting with nausea and vomiting.  Assessment & Plan Nausea and vomiting CT abdomen pelvis in ED unremarkable for acute abnormality. Abdominal pain w/ nausea and vomiting could be secondary to COVID infection and hyperkalemia. Didn't complain of as many bowel movements last night. - Renal diet as tolerated - Supportive care for COVID symptoms - Zofran 4 mg every 6 hours as needed - AM CBC and RFP if patient doesn't DC after HD Hyperkalemia Likely secondary to incomplete dialysis run 2 days ago. S/p insulin, calcium gluconate, albuterol, Lokelma in ED. K improved 6.3>5.4>4.6>5.1. No concerning EKG changes. - Discontinued Lokelma 10 g daily per nephrology - Dialysis today - Nephrology following, appreciate recs - AM RFP and BMP if patient doesn't DC after HD COVID-19 COVID test positive in the ED. Patient is overall well-appearing, on room air. 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 Reassuringly this lesion is reported unchanged compared to imaging on 04/29/2022. Unsure if this is a separate cancer or a metastasis from the prostate cancer. Discussed with oncology and they recommended urology follow up outpatient or checking  PSA/Testosterone to determine the activity of the prostate cancer.  Diarrhea Patient continues to have bouts of diarrhea. Likely related to his COVID illness. Number of bowel movements have improved since yesterday, reports more consistency in his  - GIPP negative - Loperamide if diarrhea worsens ESRD (end stage renal disease) (HCC) ESRD on HD MWF. Nephrology consulted. - HD MWF - Continue home ESRD medications - AM RFP  Chronic and Stable Problems:  HTN - amlodipine 10mg  every day, clonidine 0.3mg  patch once weekly, hydralazine 50mg  BID, metoprolol tartrate 100mg  BID HLD - rosuvastatin 10mg  every day GERD - protonix 20mg  every day Stroke 2023 - plavix 75mg   Prostate cancer - s/p radiation, currently getting hormone therapy    FEN/GI: Renal Diet PPx: Heparin Dispo: pending HD  Subjective:  Patient is doing well this morning and feels like he is symptoms are improving patient is doing well this morning and feels like his symptoms are improving.  He was frustrated with nursing regarding getting cold food and having to make his own bed after provided to his sheets.  He would like to go home because he feels like he could but take better care of himself at home.  Objective: Temp:  [97.7 F (36.5 C)-98.9 F (37.2 C)] 98.1 F (36.7 C) (09/18 0734) Pulse Rate:  [56-83] 83 (09/18 0734) Resp:  [18] 18 (09/17 2213) BP: (99-129)/(65-89) 125/89 (09/18 0734) SpO2:  [99 %-100 %] 100 % (09/18 0734) Physical Exam: General: Awake and Alert in NAD HEENT: Normocephalic, atraumatic. Conjunctiva normal. No nasal discharge Cardiovascular: RRR. No M/R/G Respiratory: CTAB, normal WOB on RA. No wheezing, crackles, rhonchi, or diminished breath sounds. Abdomen: Soft,  non-tender, non-distended. Bowel sounds normoactive Extremities: No BLE edema, no deformities or significant joint findings. Skin: Warm and dry. Neuro: A&Ox3. No focal neurological deficits.  Laboratory: Most recent CBC Lab Results   Component Value Date   WBC 4.3 10/22/2022   HGB 10.6 (L) 10/22/2022   HCT 32.0 (L) 10/22/2022   MCV 102.2 (H) 10/22/2022   PLT 112 (L) 10/22/2022   Most recent BMP    Latest Ref Rng & Units 10/22/2022    6:25 AM  BMP  Glucose 70 - 99 mg/dL 88   BUN 6 - 20 mg/dL 55   Creatinine 1.91 - 1.24 mg/dL 47.82   Sodium 956 - 213 mmol/L 135   Potassium 3.5 - 5.1 mmol/L 5.2   Chloride 98 - 111 mmol/L 91   CO2 22 - 32 mmol/L 24   Calcium 8.9 - 10.3 mg/dL 7.2     Imaging/Diagnostic Tests: No new imaging.  Fortunato Curling, DO 10/22/2022, 1:44 PM PGY-1, [facility]  FPTS Intern pager: [phone], text pages welcome Secure chat group [facility] Teaching Service

## 2022-10-22 NOTE — Progress Notes (Signed)
Admit: 10/19/2022 LOS: 3  56M ESRD MWF with N/V/D and COVID19 infection, hyperkalemia  Subjective: no c/o's this am. Diarrhea maybe a bit better. In good spirits. Fevers remain up.   No intake/output data recorded.  Filed Weights   10/20/22 0307  Weight: 90.3 kg    Scheduled Meds:  amLODipine  10 mg Oral QHS   Chlorhexidine Gluconate Cloth  6 each Topical Q0600   Chlorhexidine Gluconate Cloth  6 each Topical Q0600   cinacalcet  90 mg Oral Q supper   clopidogrel  75 mg Oral Daily   doxercalciferol  6 mcg Intravenous Q M,W,F-HD   guaiFENesin  600 mg Oral BID   heparin  5,000 Units Subcutaneous Q8H   hydrALAZINE  50 mg Oral BID   hydrOXYzine  25 mg Oral Daily   isosorbide dinitrate  20 mg Oral BID   latanoprost  1 drop Both Eyes QHS   metoprolol tartrate  100 mg Oral BID   nicotine  14 mg Transdermal Daily   pantoprazole  20 mg Oral Daily   rosuvastatin  10 mg Oral Daily   sevelamer carbonate  1,600 mg Oral TID WC   sodium zirconium cyclosilicate  10 g Oral Daily   tamsulosin  0.4 mg Oral Daily   Continuous Infusions: PRN Meds:.acetaminophen, ondansetron (ZOFRAN) IV  Current Labs: reviewed    Physical Exam:  Blood pressure 125/89, pulse 83, temperature 98.1 F (36.7 C), temperature source Oral, resp. rate 18, height 5\' 5"  (1.651 m), weight 90.3 kg, SpO2 100%. NAD, lying flat in bed comfortable appearing Regular with no murmur or rub Right IJ TDC with clean exit site Clear bilaterally Quiet bowel sounds, soft, nontender No peripheral edema Nonfocal, CN II through XII grossly intact, oriented X3  OP HD: [place] MWF    4h  87kg  2/2 bath  RIJ TDC  heparin 4500 + 2000 midrun - hectorol 6 mcg IV three times per week   Assessment/ Plan COVID-19 infection - with predominant GI symptoms, imaging negative.  Currently with supportive care per primary.  ESRD - is on MWF HD. Had HD here 9/16. HD today.  Hyperkalemia - K+ 5.2 today.  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  BMD: Continue Renvela and VDRA, trend Ca and P   Rob Arlean Hopping  MD  [facility] 10/22/2022, 10:42 AM  Recent Labs  Lab 10/21/22 0718 10/22/22 0625  HGB 11.2* 10.6*  ALBUMIN 2.8* 2.7*  CALCIUM 8.4* 7.2*  PHOS 7.3* 5.9*  CREATININE 11.09* 13.56*  K 5.1 5.2*    Inpatient medications:  amLODipine  10 mg Oral QHS   Chlorhexidine Gluconate Cloth  6 each Topical Q0600   Chlorhexidine Gluconate Cloth  6 each Topical Q0600   cinacalcet  90 mg Oral Q supper   clopidogrel  75 mg Oral Daily   doxercalciferol  6 mcg Intravenous Q M,W,F-HD   guaiFENesin  600 mg Oral BID   heparin  5,000 Units Subcutaneous Q8H   hydrALAZINE  50 mg Oral BID   hydrOXYzine  25 mg Oral Daily   isosorbide dinitrate  20 mg Oral BID   latanoprost  1 drop Both Eyes QHS   metoprolol tartrate  100 mg Oral BID   nicotine  14 mg Transdermal Daily   pantoprazole  20 mg Oral Daily   rosuvastatin  10 mg Oral Daily   sevelamer carbonate  1,600 mg Oral TID WC   sodium zirconium cyclosilicate  10 g Oral Daily   tamsulosin  0.4 mg Oral Daily    acetaminophen, ondansetron (ZOFRAN) IV

## 2022-10-22 NOTE — Assessment & Plan Note (Addendum)
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 Reassuringly this lesion is reported unchanged compared to imaging on 04/29/2022. Unsure if this is a separate cancer or a metastasis from the prostate cancer. Discussed with oncology and they recommended urology follow up outpatient or checking PSA/Testosterone to determine the activity of the prostate cancer.

## 2022-10-22 NOTE — Assessment & Plan Note (Addendum)
ESRD on HD MWF. Nephrology consulted. - HD MWF - Continue home ESRD medications - AM RFP

## 2022-10-22 NOTE — Plan of Care (Signed)

## 2022-10-22 NOTE — Assessment & Plan Note (Addendum)
Likely secondary to incomplete dialysis run 2 days ago. S/p insulin, calcium gluconate, albuterol, Lokelma in ED. K improved 6.3>5.4>4.6>5.1. No concerning EKG changes. - Discontinued Lokelma 10 g daily per nephrology - Dialysis today - Nephrology following, appreciate recs - AM RFP and BMP if patient doesn't DC after HD

## 2022-10-22 NOTE — Discharge Summary (Cosign Needed Addendum)
Family Medicine Teaching [facility] Discharge Summary  Patient name: James Wall Medical record number: [id number] Date of birth: 30-Jan-1966 Age: 57 y.o. Gender: male Date of Admission: 10/19/2022  Date of Discharge: 10/22/22  Admitting Physician: Para March, DO  Primary Care Provider: Shelby Mattocks, DO Consultants: Outpatient Oncology, Nephrology  Indication for Hospitalization: Hyperkalemia, N/V, COVID  Brief Hospital Course:  James Wall is a 57 y.o.male with a history of ESRD on HD MWF, CHF, HTN, stroke, prostate cancer who was admitted to the [facility] Medicine Teaching Service at [facility] for N/V with hyperkalemia and lactic acidosis. His hospital course is detailed below:  Nausea/Vomiting Started 1 day prior to admission. Thought to be secondary to COVID and hyperkalemia. Diffuse abdominal pain and numerous bowel movements during admission. CTAP unremarkable for acute abnormality. RUQ [place] with no gallbladder abnormality, echogenic R kidney with numerous renal cysts. Blood cultures with no growth. GIPP collected due to multiple episodes of diarrhea, results negative.  CT demonstrated possible renal cell carcinoma, unchanged from prior imaging in 2019. Discussed with outpatient oncology who recommended following up with urology at discharge, low suspicion for prostate cancer to metastasize to the kidney. According to discussion with oncology they don't believe [facility] Modena Slater) has committed that it is RCC, but could be a proteinaceous renal cyst or other benign renal lesion, reassured by the minimal change in the past 5 years.   COVID Tested positive in the ED. Symptomatic management during hospitalization.   ESRD MWF HD patient. Received dialysis on admission 9/15 and 9/18 and continued to be followed by nephrology.    Other chronic conditions were medically managed with home medications and formulary alternatives as necessary (HTN, HLD,  GERD)  PCP Follow-up Recommendations: Oncology follow up outpatient re RCC Could check PSA and testosterone outpatient per oncologist (Dr. Kathrynn Running) to determine if prostate cancer is inactive.   Discharge Diagnoses/Problem List:  Principal Problem:   Nausea and vomiting Active Problems:   Hyperkalemia   Abnormal finding on CT scan   COVID-19   ESRD (end stage renal disease) (HCC)   Lactic acidosis   COVID   Diarrhea  Disposition: Home  Discharge Condition: Stable  Discharge Exam: General: Awake and Alert in NAD HEENT: Normocephalic, atraumatic. Conjunctiva normal. No nasal discharge Cardiovascular: RRR. No M/R/G Respiratory: CTAB, normal WOB on RA. No wheezing, crackles, rhonchi, or diminished breath sounds. Abdomen: Soft, non-tender, non-distended. Bowel sounds normoactive Extremities: No BLE edema, no deformities or significant joint findings. Skin: Warm and dry. Neuro: A&Ox3. No focal neurological deficits.  Significant Procedures: none  Significant Labs and Imaging:  Recent Labs  Lab 10/21/22 0718 10/22/22 0625  WBC 4.2 4.3  HGB 11.2* 10.6*  HCT 34.8* 32.0*  PLT 119* 112*   Recent Labs  Lab 10/21/22 0718 10/22/22 0625  NA 133* 135  K 5.1 5.2*  CL 93* 91*  CO2 27 24  GLUCOSE 84 88  BUN 36* 55*  CREATININE 11.09* 13.56*  CALCIUM 8.4* 7.2*  PHOS 7.3* 5.9*  ALBUMIN 2.8* 2.7*   COVID: +  Results/Tests Pending at Time of Discharge: none  Discharge Medications:  Allergies as of 10/22/2022       Reactions   Venofer  [ferric Oxide] Itching   Aspirin Other (See Comments)   Avoids due to renal disease;  Made congestion worse        Medication List     STOP taking these medications    oxybutynin 5 MG tablet Commonly known as: DITROPAN  TAKE these medications    acetaminophen 325 MG tablet Commonly known as: TYLENOL Take 2 tablets (650 mg total) by mouth every 4 (four) hours as needed for moderate pain or mild pain.   amLODipine 10  MG tablet Commonly known as: NORVASC Take 1 tablet (10 mg total) by mouth at bedtime.   cloNIDine 0.3 mg/24hr patch Commonly known as: CATAPRES - Dosed in mg/24 hr Place 1 patch (0.3 mg total) onto the skin once a week on Sunday.   clopidogrel 75 MG tablet Commonly known as: PLAVIX Take 1 tablet (75 mg total) by mouth daily.   guaiFENesin 600 MG 12 hr tablet Commonly known as: MUCINEX Take 1 tablet (600 mg total) by mouth 2 (two) times daily.   hydrALAZINE 50 MG tablet Commonly known as: APRESOLINE Take 1 tablet (50 mg total) by mouth 2 (two) times daily.   hydrOXYzine 25 MG tablet Commonly known as: ATARAX Take 25 mg by mouth daily.   isosorbide dinitrate 20 MG tablet Commonly known as: ISORDIL Take 1 tablet (20 mg total) by mouth 2 (two) times daily.   latanoprost 0.005 % ophthalmic solution Commonly known as: XALATAN Apply to eye.   metoprolol tartrate 100 MG tablet Commonly known as: LOPRESSOR Take 1 tablet (100 mg total) by mouth 2 (two) times daily.   pantoprazole 20 MG tablet Commonly known as: PROTONIX TAKE 1 TABLET(20 MG) BY MOUTH DAILY   rosuvastatin 10 MG tablet Commonly known as: CRESTOR Take 1 tablet (10 mg total) by mouth daily.   Sensipar 90 MG tablet Generic drug: cinacalcet Take 90 mg by mouth daily.   sevelamer carbonate 800 MG tablet Commonly known as: RENVELA Take 4 tablets (3,200 mg total) by mouth 3 (three) times daily with meals.   tamsulosin 0.4 MG Caps capsule Commonly known as: FLOMAX TAKE 2 CAPSULES(0.8 MG) BY MOUTH DAILY What changed: See the new instructions.        Discharge Instructions: Please refer to Patient Instructions section of EMR for full details.  Patient was counseled important signs and symptoms that should prompt return to medical care, changes in medications, dietary instructions, activity restrictions, and follow up appointments.   Follow-Up Appointments:  Follow-up Information     Shelby Mattocks, DO  Follow up.   Specialty: Family Medicine Contact information: [address] [place] [address] [phone]                 Fortunato Curling, DO 10/22/2022, 5:51 PM PGY-1, [facility]

## 2022-10-22 NOTE — Discharge Instructions (Addendum)
Dear [name],   Thank you so much for allowing [place] to be part of your care!  You were admitted to [facility] for diarrhea. We think that these symptoms are most likely from your Covid infection. Your symptoms should continue to improve as your body fights off the infection.    POST-HOSPITAL & CARE INSTRUCTIONS Be sure to drink plenty of water to stay hydrated.  Take Tylenol as needed if you get any fevers You have a follow-up appointment with at the [facility].  Please let PCP/Specialists know of any changes that were made.  Please see medications section of this packet for any medication changes.   DOCTOR'S APPOINTMENT & FOLLOW UP CARE INSTRUCTIONS  Future Appointments  Date Time Provider Department Center  10/29/2022 10:15 AM Nelia Shi, MD [facility]  11/04/2022  2:00 PM [name], RN [facility] None    RETURN PRECAUTIONS: Please come back to the Emergency department if: - You are unable to drink fluids to keep yourself hydrated - You are vomiting uncontrollably - You start to have uncontrolled abdominal pain - You start to have fevers of 100.29F or higher for 3 days in a row  Take care and be well!  Family Medicine Teaching Service  [place]  [facility]  [address] [place], [place] [address] [phone]

## 2022-10-22 NOTE — Assessment & Plan Note (Addendum)
CT abdomen pelvis in ED unremarkable for acute abnormality. Abdominal pain w/ nausea and vomiting could be secondary to COVID infection and hyperkalemia. Didn't complain of as many bowel movements last night. - Renal diet as tolerated - Supportive care for COVID symptoms - Zofran 4 mg every 6 hours as needed - AM CBC and RFP if patient doesn't DC after HD

## 2022-10-22 NOTE — Assessment & Plan Note (Addendum)
Patient continues to have bouts of diarrhea. Likely related to his COVID illness. Number of bowel movements have improved since yesterday, reports more consistency in his  - GIPP negative - Loperamide if diarrhea worsens

## 2022-10-22 NOTE — Assessment & Plan Note (Signed)
COVID test positive in the ED. Patient is overall well-appearing, on room air. LA 2>3.2>1.0 - Droplet and contact precautions - Supportive care - Tylenol as needed for fever/pain

## 2022-10-23 NOTE — Progress Notes (Signed)
Late Note Entry- October 23, 2022  Pt was d/c late yesterday. Contacted [facility] this morning to advise clinic of pt's d/c date and that pt should resume care tomorrow.   Olivia Canter Renal Navigator [phone]

## 2022-10-23 NOTE — Plan of Care (Signed)
[facility] Patient Discharge Orders- [facility] CLINIC: [place]   Patient's name: James Wall [facility] Admit/DC Dates: 10/19/2022 - 10/22/2022  Discharge Diagnoses: Covid -19 infection    Hyperkalemia  Aranesp: Given: ---   Date and amount of last dose: --  Last Hgb: 10.6 PRBC's Given: -- Date/# of units: -- ESA dose for discharge: per protocol IV Iron dose at discharge: --  Heparin change: --  EDW Change: 86 kg   Bath Change: --  Access intervention/Change: --   Hectorol/Calcitriol change: --  Discharge Labs: Calcium 7.2 Phosphorus 5.9 Albumin 2.7 K+ 5.2  IV Antibiotics: ---  On Coumadin: --    OTHER/APPTS/LAB ORDERS:    D/C Meds to be reconciled by nurse after every discharge.  Completed By: Tomasa Blase PA-C    Reviewed by: MD:______ RN_______

## 2022-10-24 DIAGNOSIS — N186 End stage renal disease: Secondary | ICD-10-CM | POA: Diagnosis not present

## 2022-10-24 DIAGNOSIS — Z992 Dependence on renal dialysis: Secondary | ICD-10-CM | POA: Diagnosis not present

## 2022-10-24 DIAGNOSIS — U071 COVID-19: Secondary | ICD-10-CM | POA: Diagnosis not present

## 2022-10-24 DIAGNOSIS — D631 Anemia in chronic kidney disease: Secondary | ICD-10-CM | POA: Diagnosis not present

## 2022-10-24 DIAGNOSIS — R197 Diarrhea, unspecified: Secondary | ICD-10-CM | POA: Diagnosis not present

## 2022-10-24 DIAGNOSIS — N2581 Secondary hyperparathyroidism of renal origin: Secondary | ICD-10-CM | POA: Diagnosis not present

## 2022-10-24 DIAGNOSIS — D689 Coagulation defect, unspecified: Secondary | ICD-10-CM | POA: Diagnosis not present

## 2022-10-24 DIAGNOSIS — D509 Iron deficiency anemia, unspecified: Secondary | ICD-10-CM | POA: Diagnosis not present

## 2022-10-24 LAB — CULTURE, BLOOD (ROUTINE X 2)
Culture: NO GROWTH
Culture: NO GROWTH

## 2022-10-27 DIAGNOSIS — D689 Coagulation defect, unspecified: Secondary | ICD-10-CM | POA: Diagnosis not present

## 2022-10-27 DIAGNOSIS — N186 End stage renal disease: Secondary | ICD-10-CM | POA: Diagnosis not present

## 2022-10-27 DIAGNOSIS — N2581 Secondary hyperparathyroidism of renal origin: Secondary | ICD-10-CM | POA: Diagnosis not present

## 2022-10-27 DIAGNOSIS — D509 Iron deficiency anemia, unspecified: Secondary | ICD-10-CM | POA: Diagnosis not present

## 2022-10-27 DIAGNOSIS — R197 Diarrhea, unspecified: Secondary | ICD-10-CM | POA: Diagnosis not present

## 2022-10-27 DIAGNOSIS — Z992 Dependence on renal dialysis: Secondary | ICD-10-CM | POA: Diagnosis not present

## 2022-10-27 DIAGNOSIS — D631 Anemia in chronic kidney disease: Secondary | ICD-10-CM | POA: Diagnosis not present

## 2022-10-29 ENCOUNTER — Ambulatory Visit: Payer: Self-pay | Admitting: Family Medicine

## 2022-10-29 DIAGNOSIS — Z992 Dependence on renal dialysis: Secondary | ICD-10-CM | POA: Diagnosis not present

## 2022-10-29 DIAGNOSIS — R197 Diarrhea, unspecified: Secondary | ICD-10-CM | POA: Diagnosis not present

## 2022-10-29 DIAGNOSIS — D631 Anemia in chronic kidney disease: Secondary | ICD-10-CM | POA: Diagnosis not present

## 2022-10-29 DIAGNOSIS — N2581 Secondary hyperparathyroidism of renal origin: Secondary | ICD-10-CM | POA: Diagnosis not present

## 2022-10-29 DIAGNOSIS — D509 Iron deficiency anemia, unspecified: Secondary | ICD-10-CM | POA: Diagnosis not present

## 2022-10-29 DIAGNOSIS — N186 End stage renal disease: Secondary | ICD-10-CM | POA: Diagnosis not present

## 2022-10-29 DIAGNOSIS — D689 Coagulation defect, unspecified: Secondary | ICD-10-CM | POA: Diagnosis not present

## 2022-11-03 DIAGNOSIS — N186 End stage renal disease: Secondary | ICD-10-CM | POA: Diagnosis not present

## 2022-11-03 DIAGNOSIS — D689 Coagulation defect, unspecified: Secondary | ICD-10-CM | POA: Diagnosis not present

## 2022-11-03 DIAGNOSIS — R197 Diarrhea, unspecified: Secondary | ICD-10-CM | POA: Diagnosis not present

## 2022-11-03 DIAGNOSIS — D631 Anemia in chronic kidney disease: Secondary | ICD-10-CM | POA: Diagnosis not present

## 2022-11-03 DIAGNOSIS — D509 Iron deficiency anemia, unspecified: Secondary | ICD-10-CM | POA: Diagnosis not present

## 2022-11-03 DIAGNOSIS — N2581 Secondary hyperparathyroidism of renal origin: Secondary | ICD-10-CM | POA: Diagnosis not present

## 2022-11-03 DIAGNOSIS — I12 Hypertensive chronic kidney disease with 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: Secondary | ICD-10-CM | POA: Diagnosis not present

## 2022-11-03 DIAGNOSIS — Z992 Dependence on renal dialysis: Secondary | ICD-10-CM | POA: Diagnosis not present

## 2022-11-04 ENCOUNTER — Telehealth: Payer: Self-pay

## 2022-11-04 NOTE — Patient Outreach (Signed)
Care Coordination   11/04/2022 Name: James Wall MRN: [id number] DOB: 1966/01/28   Care Coordination Outreach Attempts:  An unsuccessful telephone outreach was attempted today to offer the patient information about available care coordination services.  Follow Up Plan:  Additional outreach attempts will be made to offer the patient care coordination information and services.   Encounter Outcome:  No Answer   Care Coordination Interventions:  No, not indicated  {[facility] Tip this will not be part of the note when signed-REQUIRED REPORT FIELD DO NOT DELETE (Optional):[id number]  [name] RN, BSN, [facility] [employer] blower/designer Phone: [phone]

## 2022-11-05 DIAGNOSIS — R197 Diarrhea, unspecified: Secondary | ICD-10-CM | POA: Diagnosis not present

## 2022-11-05 DIAGNOSIS — N186 End stage renal disease: Secondary | ICD-10-CM | POA: Diagnosis not present

## 2022-11-05 DIAGNOSIS — D509 Iron deficiency anemia, unspecified: Secondary | ICD-10-CM | POA: Diagnosis not present

## 2022-11-05 DIAGNOSIS — Z992 Dependence on renal dialysis: Secondary | ICD-10-CM | POA: Diagnosis not present

## 2022-11-05 DIAGNOSIS — T8249XA Other complication of vascular dialysis catheter, initial encounter: Secondary | ICD-10-CM | POA: Diagnosis not present

## 2022-11-05 DIAGNOSIS — D631 Anemia in chronic kidney disease: Secondary | ICD-10-CM | POA: Diagnosis not present

## 2022-11-05 DIAGNOSIS — D689 Coagulation defect, unspecified: Secondary | ICD-10-CM | POA: Diagnosis not present

## 2022-11-05 DIAGNOSIS — N2581 Secondary hyperparathyroidism of renal origin: Secondary | ICD-10-CM | POA: Diagnosis not present

## 2022-11-06 ENCOUNTER — Encounter: Payer: Self-pay | Admitting: Family Medicine

## 2022-11-06 ENCOUNTER — Ambulatory Visit: Payer: 59 | Admitting: Family Medicine

## 2022-11-06 ENCOUNTER — Ambulatory Visit: Payer: 59 | Attending: Family Medicine

## 2022-11-06 VITALS — BP 100/63 | HR 60 | Ht 65.0 in | Wt 195.2 lb

## 2022-11-06 DIAGNOSIS — Z720 Tobacco use: Secondary | ICD-10-CM

## 2022-11-06 DIAGNOSIS — R002 Palpitations: Secondary | ICD-10-CM

## 2022-11-06 DIAGNOSIS — Z8546 Personal history of malignant neoplasm of prostate: Secondary | ICD-10-CM | POA: Diagnosis not present

## 2022-11-06 DIAGNOSIS — K5900 Constipation, unspecified: Secondary | ICD-10-CM | POA: Diagnosis not present

## 2022-11-06 DIAGNOSIS — C61 Malignant neoplasm of prostate: Secondary | ICD-10-CM

## 2022-11-06 LAB — POCT HEMOGLOBIN: Hemoglobin: 11.7 g/dL (ref 11–14.6)

## 2022-11-06 MED ORDER — SENNA 8.6 MG PO TABS
1.0000 | ORAL_TABLET | Freq: Every day | ORAL | 0 refills | Status: DC
Start: 2022-11-06 — End: 2023-11-24

## 2022-11-06 NOTE — Progress Notes (Addendum)
SUBJECTIVE:   CHIEF COMPLAINT / HPI:   Hospital F/U - Was admitted for hyperkalemia in setting of incomplete dialysis run, also with COVID 19 infection and severe N/V/D - Was found to have lesion on CTAP concerning for small RCC, oncology recommended urology follow up outpatient or checking PSA/Testosterone to determine the activity of the prostate cancer -Patient states that he has an appointment with urology in December  Palpitations - Pt with intermittent palpitations since Monday after dialysis, No Palpitations today - Patient with history of palpitations in the setting of dialysis, states that he has not experienced these in a long time however they restarted this week - Palpitations typically last 15 to 20 minutes - Denies chest pain, shortness of breath, nausea, diarrhea, fatigue, lightheadedness - Patient typically does not fully get his dialysis runs due to leg cramping.  States that he ended dialysis 2 hours early yesterday due to leg cramping.  Constipation Reports constipation ever since starting Renvela for dialysis.  No blood in stool, has noticed some blood on the tissue after he wipes but thinks it is related to straining to have a bowel movement.  PERTINENT  PMH / PSH: ESRD on dialysis MWF, CVA, HTN, COPD, prostate cancer,   OBJECTIVE:   BP 100/63   Pulse 60   Ht 5\' 5"  (1.651 m)   Wt 195 lb 3.2 oz (88.5 kg)   SpO2 100%   BMI 32.48 kg/m   Gen: well appearing, no acute distress, ambulates with cane CV: RRR, no murmurs auscultated Resp: CTAB, no increased work of breathing on RA Ext: No leg swelling BLE EKG: Sinus bradycardia at 58 bpm. T wave inversions in lead I, lead II, aVL consistent with prior EKG. QTc 484.  Reviewed with Dr. Miquel Dunn.  ASSESSMENT/PLAN:   Tobacco abuse Patient states that his insurance is not covering the nicotine patches he was discharged from the hospital with. Provided [place] number to try to obtain patches, and can buy them OTC if  insurance still is not covering  Palpitations Intermittent palpitations after dialysis, states that they worsened this week.  He also notices them when he is doing different activities around the house.  No associated chest pain or shortness of breath. - EKG unchanged from previous - Stat BMP, CBC, POC hemoglobin, magnesium obtained to evaluate for anemia or electrolyte disturbance.  Patient was recently admitted to the hospital due to hyperkalemia. -With his recent history of hyperkalemia requiring hospitalization and recent incomplete dialysis run, I discussed with him going straight to the ED from the clinic for his palpitations.  Shared decision making was used and patient opted to wait until his lab work returned before he went to the ED. - Strict return precautions discussed, patient advised to go to the emergency room if he experiences the palpitations again or has chest pain, shortness of breath, or other worsened symptoms  Constipation Constipation ever since starting Renvela for dialysis.  Some blood noted with wiping after a bowel movement, thinks this is secondary to straining to defecate. - Offered MiraLAX, patient requests laxative - Senna sent to pharmacy  Malignant neoplasm of prostate [facility]) While patient was admitted to the hospital, incidental finding of a renal lesion was noted on his CT scan.  Oncology was not concerned about this being metastatic prostate cancer at that time. Discussed with his oncologist while he was admitted, they recommended obtaining testosterone and PSA outpatient and then having patient follow-up with urology. - PSA today, patient will make appointment  to get testosterone drawn between 8 AM and 10 AM. - Patient has an appointment with urology in December   Para March, DO [facility]

## 2022-11-06 NOTE — Assessment & Plan Note (Signed)
While patient was admitted to the hospital, incidental finding of a renal lesion was noted on his CT scan.  Oncology was not concerned about this being metastatic prostate cancer at that time. Discussed with his oncologist while he was admitted, they recommended obtaining testosterone and PSA outpatient and then having patient follow-up with urology. - PSA today, patient will make appointment to get testosterone drawn between 8 AM and 10 AM. - Patient has an appointment with urology in December

## 2022-11-06 NOTE — Assessment & Plan Note (Addendum)
Intermittent palpitations after dialysis, states that they worsened this week.  He also notices them when he is doing different activities around the house.  No associated chest pain or shortness of breath. - EKG unchanged from previous - Stat BMP, CBC, POC hemoglobin, magnesium obtained to evaluate for anemia or electrolyte disturbance.  Patient was recently admitted to the hospital due to hyperkalemia. -With his recent history of hyperkalemia requiring hospitalization and recent incomplete dialysis run, I discussed with him going straight to the ED from the clinic for his palpitations.  Shared decision making was used and patient opted to wait until his lab work returned before he went to the ED. - Strict return precautions discussed, patient advised to go to the emergency room if he experiences the palpitations again or has chest pain, shortness of breath, or other worsened symptoms

## 2022-11-06 NOTE — Assessment & Plan Note (Addendum)
Patient states that his insurance is not covering the nicotine patches he was discharged from the hospital with. Provided [employer] number to try to obtain patches, and can buy them OTC if insurance still is not covering

## 2022-11-06 NOTE — Patient Instructions (Signed)
It was great to see you today! Thank you for choosing [facility] for your primary care. James Wall was seen for hospital follow up, palpitations.  Today we addressed: Palpitations-I have ordered some lab work to make sure that your electrolytes and blood levels are okay.  Your EKG looked okay today.  A long-term heart monitor will be sent to your house to be able to wear.  The palpitations come back, associated with any chest pain, shortness of breath, or other concerning symptoms please go to the emergency room. Constipation-I have sent a laxative to your pharmacy. Please make an appointment to come back and get a testosterone level checked between 8 AM and 10 AM for your urology appointment.  If you haven't already, sign up for My Chart to have easy access to your labs results, and communication with your primary care physician.  Call the clinic at [phone] if your symptoms worsen or you have any concerns.  You should return to our clinic No follow-ups on file. Please arrive 15 minutes before your appointment to ensure smooth check in process.  We appreciate your efforts in making this happen.  Thank you for allowing me to participate in your care, Para March, DO 11/06/2022, 10:52 AM PGY-1, [facility]

## 2022-11-06 NOTE — Progress Notes (Unsigned)
Enrolled patient for a 14 day Zio XT  monitor to be mailed to patients home  °

## 2022-11-06 NOTE — Assessment & Plan Note (Signed)
Constipation ever since starting Renvela for dialysis.  Some blood noted with wiping after a bowel movement, thinks this is secondary to straining to defecate. James Wall, patient requests laxative - Senna sent to pharmacy

## 2022-11-07 DIAGNOSIS — N186 End stage renal disease: Secondary | ICD-10-CM | POA: Diagnosis not present

## 2022-11-07 DIAGNOSIS — D509 Iron deficiency anemia, unspecified: Secondary | ICD-10-CM | POA: Diagnosis not present

## 2022-11-07 DIAGNOSIS — D631 Anemia in chronic kidney disease: Secondary | ICD-10-CM | POA: Diagnosis not present

## 2022-11-07 DIAGNOSIS — Z992 Dependence on renal dialysis: Secondary | ICD-10-CM | POA: Diagnosis not present

## 2022-11-07 DIAGNOSIS — R197 Diarrhea, unspecified: Secondary | ICD-10-CM | POA: Diagnosis not present

## 2022-11-07 DIAGNOSIS — N2581 Secondary hyperparathyroidism of renal origin: Secondary | ICD-10-CM | POA: Diagnosis not present

## 2022-11-07 DIAGNOSIS — D689 Coagulation defect, unspecified: Secondary | ICD-10-CM | POA: Diagnosis not present

## 2022-11-07 DIAGNOSIS — T8249XA Other complication of vascular dialysis catheter, initial encounter: Secondary | ICD-10-CM | POA: Diagnosis not present

## 2022-11-07 LAB — BASIC METABOLIC PANEL
BUN/Creatinine Ratio: 3 — ABNORMAL LOW (ref 9–20)
BUN: 42 mg/dL — ABNORMAL HIGH (ref 6–24)
CO2: 29 mmol/L (ref 20–29)
Calcium: 9.5 mg/dL (ref 8.7–10.2)
Chloride: 95 mmol/L — ABNORMAL LOW (ref 96–106)
Creatinine, Ser: 13.57 mg/dL — ABNORMAL HIGH (ref 0.76–1.27)
Glucose: 84 mg/dL (ref 70–99)
Potassium: 4.8 mmol/L (ref 3.5–5.2)
Sodium: 139 mmol/L (ref 134–144)
eGFR: 4 mL/min/{1.73_m2} — ABNORMAL LOW (ref >59)

## 2022-11-07 LAB — CBC
Hematocrit: 34.3 % — ABNORMAL LOW (ref 37.5–51.0)
Hemoglobin: 11.7 g/dL — ABNORMAL LOW (ref 13.0–17.7)
MCH: 33.1 pg — ABNORMAL HIGH (ref 26.6–33.0)
MCHC: 34.1 g/dL (ref 31.5–35.7)
MCV: 97 fL (ref 79–97)
Platelets: 233 10*3/uL (ref 150–450)
RBC: 3.53 x10E6/uL — ABNORMAL LOW (ref 4.14–5.80)
RDW: 13.9 % (ref 11.6–15.4)
WBC: 4.7 10*3/uL (ref 3.4–10.8)

## 2022-11-07 LAB — MAGNESIUM: Magnesium: 2.4 mg/dL — ABNORMAL HIGH (ref 1.6–2.3)

## 2022-11-07 LAB — PSA: Prostate Specific Ag, Serum: <0.1 ng/mL (ref 0.0–4.0)

## 2022-11-10 ENCOUNTER — Encounter: Payer: Self-pay | Admitting: Family Medicine

## 2022-11-10 DIAGNOSIS — Z992 Dependence on renal dialysis: Secondary | ICD-10-CM | POA: Diagnosis not present

## 2022-11-10 DIAGNOSIS — R002 Palpitations: Secondary | ICD-10-CM | POA: Diagnosis not present

## 2022-11-10 DIAGNOSIS — D689 Coagulation defect, unspecified: Secondary | ICD-10-CM | POA: Diagnosis not present

## 2022-11-10 DIAGNOSIS — T8249XA Other complication of vascular dialysis catheter, initial encounter: Secondary | ICD-10-CM | POA: Diagnosis not present

## 2022-11-10 DIAGNOSIS — N2581 Secondary hyperparathyroidism of renal origin: Secondary | ICD-10-CM | POA: Diagnosis not present

## 2022-11-10 DIAGNOSIS — R197 Diarrhea, unspecified: Secondary | ICD-10-CM | POA: Diagnosis not present

## 2022-11-10 DIAGNOSIS — D631 Anemia in chronic kidney disease: Secondary | ICD-10-CM | POA: Diagnosis not present

## 2022-11-10 DIAGNOSIS — D509 Iron deficiency anemia, unspecified: Secondary | ICD-10-CM | POA: Diagnosis not present

## 2022-11-10 DIAGNOSIS — N186 End stage renal disease: Secondary | ICD-10-CM | POA: Diagnosis not present

## 2022-11-11 ENCOUNTER — Other Ambulatory Visit: Payer: 59

## 2022-11-11 DIAGNOSIS — Z8546 Personal history of malignant neoplasm of prostate: Secondary | ICD-10-CM

## 2022-11-12 DIAGNOSIS — N186 End stage renal disease: Secondary | ICD-10-CM | POA: Diagnosis not present

## 2022-11-12 DIAGNOSIS — N2581 Secondary hyperparathyroidism of renal origin: Secondary | ICD-10-CM | POA: Diagnosis not present

## 2022-11-12 DIAGNOSIS — D509 Iron deficiency anemia, unspecified: Secondary | ICD-10-CM | POA: Diagnosis not present

## 2022-11-12 DIAGNOSIS — T8249XA Other complication of vascular dialysis catheter, initial encounter: Secondary | ICD-10-CM | POA: Diagnosis not present

## 2022-11-12 DIAGNOSIS — Z992 Dependence on renal dialysis: Secondary | ICD-10-CM | POA: Diagnosis not present

## 2022-11-12 DIAGNOSIS — R197 Diarrhea, unspecified: Secondary | ICD-10-CM | POA: Diagnosis not present

## 2022-11-12 DIAGNOSIS — D631 Anemia in chronic kidney disease: Secondary | ICD-10-CM | POA: Diagnosis not present

## 2022-11-12 DIAGNOSIS — D689 Coagulation defect, unspecified: Secondary | ICD-10-CM | POA: Diagnosis not present

## 2022-11-12 LAB — TESTOSTERONE: Testosterone: 12 ng/dL — ABNORMAL LOW (ref 264–916)

## 2022-11-13 NOTE — Progress Notes (Signed)
Patient called and made aware. Forwarded lab result to his urologist, Dr. Berneice Heinrich. He has an appointment there 01/15/23 at 9:15am.

## 2022-11-14 DIAGNOSIS — Z992 Dependence on renal dialysis: Secondary | ICD-10-CM | POA: Diagnosis not present

## 2022-11-14 DIAGNOSIS — N2581 Secondary hyperparathyroidism of renal origin: Secondary | ICD-10-CM | POA: Diagnosis not present

## 2022-11-14 DIAGNOSIS — D689 Coagulation defect, unspecified: Secondary | ICD-10-CM | POA: Diagnosis not present

## 2022-11-14 DIAGNOSIS — N186 End stage renal disease: Secondary | ICD-10-CM | POA: Diagnosis not present

## 2022-11-14 DIAGNOSIS — R197 Diarrhea, unspecified: Secondary | ICD-10-CM | POA: Diagnosis not present

## 2022-11-14 DIAGNOSIS — D631 Anemia in chronic kidney disease: Secondary | ICD-10-CM | POA: Diagnosis not present

## 2022-11-14 DIAGNOSIS — D509 Iron deficiency anemia, unspecified: Secondary | ICD-10-CM | POA: Diagnosis not present

## 2022-11-14 DIAGNOSIS — T8249XA Other complication of vascular dialysis catheter, initial encounter: Secondary | ICD-10-CM | POA: Diagnosis not present

## 2022-11-17 DIAGNOSIS — D631 Anemia in chronic kidney disease: Secondary | ICD-10-CM | POA: Diagnosis not present

## 2022-11-17 DIAGNOSIS — T8249XA Other complication of vascular dialysis catheter, initial encounter: Secondary | ICD-10-CM | POA: Diagnosis not present

## 2022-11-17 DIAGNOSIS — Z992 Dependence on renal dialysis: Secondary | ICD-10-CM | POA: Diagnosis not present

## 2022-11-17 DIAGNOSIS — D689 Coagulation defect, unspecified: Secondary | ICD-10-CM | POA: Diagnosis not present

## 2022-11-17 DIAGNOSIS — N186 End stage renal disease: Secondary | ICD-10-CM | POA: Diagnosis not present

## 2022-11-17 DIAGNOSIS — N2581 Secondary hyperparathyroidism of renal origin: Secondary | ICD-10-CM | POA: Diagnosis not present

## 2022-11-17 DIAGNOSIS — R197 Diarrhea, unspecified: Secondary | ICD-10-CM | POA: Diagnosis not present

## 2022-11-17 DIAGNOSIS — D509 Iron deficiency anemia, unspecified: Secondary | ICD-10-CM | POA: Diagnosis not present

## 2022-11-19 DIAGNOSIS — D689 Coagulation defect, unspecified: Secondary | ICD-10-CM | POA: Diagnosis not present

## 2022-11-19 DIAGNOSIS — N186 End stage renal disease: Secondary | ICD-10-CM | POA: Diagnosis not present

## 2022-11-19 DIAGNOSIS — D631 Anemia in chronic kidney disease: Secondary | ICD-10-CM | POA: Diagnosis not present

## 2022-11-19 DIAGNOSIS — Z992 Dependence on renal dialysis: Secondary | ICD-10-CM | POA: Diagnosis not present

## 2022-11-19 DIAGNOSIS — R197 Diarrhea, unspecified: Secondary | ICD-10-CM | POA: Diagnosis not present

## 2022-11-19 DIAGNOSIS — D509 Iron deficiency anemia, unspecified: Secondary | ICD-10-CM | POA: Diagnosis not present

## 2022-11-19 DIAGNOSIS — N2581 Secondary hyperparathyroidism of renal origin: Secondary | ICD-10-CM | POA: Diagnosis not present

## 2022-11-19 DIAGNOSIS — T8249XA Other complication of vascular dialysis catheter, initial encounter: Secondary | ICD-10-CM | POA: Diagnosis not present

## 2022-11-21 DIAGNOSIS — T8249XA Other complication of vascular dialysis catheter, initial encounter: Secondary | ICD-10-CM | POA: Diagnosis not present

## 2022-11-21 DIAGNOSIS — D509 Iron deficiency anemia, unspecified: Secondary | ICD-10-CM | POA: Diagnosis not present

## 2022-11-21 DIAGNOSIS — D689 Coagulation defect, unspecified: Secondary | ICD-10-CM | POA: Diagnosis not present

## 2022-11-21 DIAGNOSIS — R197 Diarrhea, unspecified: Secondary | ICD-10-CM | POA: Diagnosis not present

## 2022-11-21 DIAGNOSIS — N186 End stage renal disease: Secondary | ICD-10-CM | POA: Diagnosis not present

## 2022-11-21 DIAGNOSIS — Z992 Dependence on renal dialysis: Secondary | ICD-10-CM | POA: Diagnosis not present

## 2022-11-21 DIAGNOSIS — N2581 Secondary hyperparathyroidism of renal origin: Secondary | ICD-10-CM | POA: Diagnosis not present

## 2022-11-21 DIAGNOSIS — D631 Anemia in chronic kidney disease: Secondary | ICD-10-CM | POA: Diagnosis not present

## 2022-11-24 DIAGNOSIS — D689 Coagulation defect, unspecified: Secondary | ICD-10-CM | POA: Diagnosis not present

## 2022-11-24 DIAGNOSIS — N186 End stage renal disease: Secondary | ICD-10-CM | POA: Diagnosis not present

## 2022-11-24 DIAGNOSIS — N2581 Secondary hyperparathyroidism of renal origin: Secondary | ICD-10-CM | POA: Diagnosis not present

## 2022-11-24 DIAGNOSIS — R197 Diarrhea, unspecified: Secondary | ICD-10-CM | POA: Diagnosis not present

## 2022-11-24 DIAGNOSIS — D631 Anemia in chronic kidney disease: Secondary | ICD-10-CM | POA: Diagnosis not present

## 2022-11-24 DIAGNOSIS — Z992 Dependence on renal dialysis: Secondary | ICD-10-CM | POA: Diagnosis not present

## 2022-11-24 DIAGNOSIS — D509 Iron deficiency anemia, unspecified: Secondary | ICD-10-CM | POA: Diagnosis not present

## 2022-11-24 DIAGNOSIS — T8249XA Other complication of vascular dialysis catheter, initial encounter: Secondary | ICD-10-CM | POA: Diagnosis not present

## 2022-11-26 DIAGNOSIS — R197 Diarrhea, unspecified: Secondary | ICD-10-CM | POA: Diagnosis not present

## 2022-11-26 DIAGNOSIS — D509 Iron deficiency anemia, unspecified: Secondary | ICD-10-CM | POA: Diagnosis not present

## 2022-11-26 DIAGNOSIS — D631 Anemia in chronic kidney disease: Secondary | ICD-10-CM | POA: Diagnosis not present

## 2022-11-26 DIAGNOSIS — N186 End stage renal disease: Secondary | ICD-10-CM | POA: Diagnosis not present

## 2022-11-26 DIAGNOSIS — Z992 Dependence on renal dialysis: Secondary | ICD-10-CM | POA: Diagnosis not present

## 2022-11-26 DIAGNOSIS — T8249XA Other complication of vascular dialysis catheter, initial encounter: Secondary | ICD-10-CM | POA: Diagnosis not present

## 2022-11-26 DIAGNOSIS — N2581 Secondary hyperparathyroidism of renal origin: Secondary | ICD-10-CM | POA: Diagnosis not present

## 2022-11-26 DIAGNOSIS — D689 Coagulation defect, unspecified: Secondary | ICD-10-CM | POA: Diagnosis not present

## 2022-11-28 DIAGNOSIS — R197 Diarrhea, unspecified: Secondary | ICD-10-CM | POA: Diagnosis not present

## 2022-11-28 DIAGNOSIS — Z992 Dependence on renal dialysis: Secondary | ICD-10-CM | POA: Diagnosis not present

## 2022-11-28 DIAGNOSIS — N186 End stage renal disease: Secondary | ICD-10-CM | POA: Diagnosis not present

## 2022-11-28 DIAGNOSIS — T8249XA Other complication of vascular dialysis catheter, initial encounter: Secondary | ICD-10-CM | POA: Diagnosis not present

## 2022-11-28 DIAGNOSIS — D631 Anemia in chronic kidney disease: Secondary | ICD-10-CM | POA: Diagnosis not present

## 2022-11-28 DIAGNOSIS — D509 Iron deficiency anemia, unspecified: Secondary | ICD-10-CM | POA: Diagnosis not present

## 2022-11-28 DIAGNOSIS — D689 Coagulation defect, unspecified: Secondary | ICD-10-CM | POA: Diagnosis not present

## 2022-11-28 DIAGNOSIS — N2581 Secondary hyperparathyroidism of renal origin: Secondary | ICD-10-CM | POA: Diagnosis not present

## 2022-12-01 DIAGNOSIS — D689 Coagulation defect, unspecified: Secondary | ICD-10-CM | POA: Diagnosis not present

## 2022-12-01 DIAGNOSIS — N2581 Secondary hyperparathyroidism of renal origin: Secondary | ICD-10-CM | POA: Diagnosis not present

## 2022-12-01 DIAGNOSIS — R197 Diarrhea, unspecified: Secondary | ICD-10-CM | POA: Diagnosis not present

## 2022-12-01 DIAGNOSIS — Z992 Dependence on renal dialysis: Secondary | ICD-10-CM | POA: Diagnosis not present

## 2022-12-01 DIAGNOSIS — D631 Anemia in chronic kidney disease: Secondary | ICD-10-CM | POA: Diagnosis not present

## 2022-12-01 DIAGNOSIS — D509 Iron deficiency anemia, unspecified: Secondary | ICD-10-CM | POA: Diagnosis not present

## 2022-12-01 DIAGNOSIS — N186 End stage renal disease: Secondary | ICD-10-CM | POA: Diagnosis not present

## 2022-12-01 DIAGNOSIS — T8249XA Other complication of vascular dialysis catheter, initial encounter: Secondary | ICD-10-CM | POA: Diagnosis not present

## 2022-12-03 DIAGNOSIS — D689 Coagulation defect, unspecified: Secondary | ICD-10-CM | POA: Diagnosis not present

## 2022-12-03 DIAGNOSIS — D631 Anemia in chronic kidney disease: Secondary | ICD-10-CM | POA: Diagnosis not present

## 2022-12-03 DIAGNOSIS — N2581 Secondary hyperparathyroidism of renal origin: Secondary | ICD-10-CM | POA: Diagnosis not present

## 2022-12-03 DIAGNOSIS — R197 Diarrhea, unspecified: Secondary | ICD-10-CM | POA: Diagnosis not present

## 2022-12-03 DIAGNOSIS — Z992 Dependence on renal dialysis: Secondary | ICD-10-CM | POA: Diagnosis not present

## 2022-12-03 DIAGNOSIS — D509 Iron deficiency anemia, unspecified: Secondary | ICD-10-CM | POA: Diagnosis not present

## 2022-12-03 DIAGNOSIS — N186 End stage renal disease: Secondary | ICD-10-CM | POA: Diagnosis not present

## 2022-12-03 DIAGNOSIS — T8249XA Other complication of vascular dialysis catheter, initial encounter: Secondary | ICD-10-CM | POA: Diagnosis not present

## 2022-12-04 ENCOUNTER — Ambulatory Visit: Payer: Self-pay

## 2022-12-04 DIAGNOSIS — I12 Hypertensive chronic kidney disease with 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: Secondary | ICD-10-CM | POA: Diagnosis not present

## 2022-12-04 DIAGNOSIS — Z992 Dependence on renal dialysis: Secondary | ICD-10-CM | POA: Diagnosis not present

## 2022-12-04 DIAGNOSIS — N186 End stage renal disease: Secondary | ICD-10-CM | POA: Diagnosis not present

## 2022-12-04 NOTE — Patient Instructions (Signed)
Visit Information  Thank you for taking time to visit with me today. Please don't hesitate to contact me if I can be of assistance to you.   Following are the goals we discussed today:   Goals Addressed             This Visit's Progress    I was just out of the hospital for stroke       Patient Goals/Self Care Activities: -Calls provider office for new concerns, questions, or BP outside discussed parameters -Checks BP and record as discussed Assessed for signs and symptoms of stroke Active listening / Reflection utilized  Emotional Support Provided Problem Solving /Task Center strategies reviewed BP Readings from Last 3 Encounters:  11/06/22 100/63  10/22/22 (!) 132/92  06/26/22 (!) 141/77            Our next appointment is by telephone on 12/25/22 at 9 am  Please call the care guide team at [phone] if you need to cancel or reschedule your appointment.   If you are experiencing a Mental Health or Behavioral Health Crisis or need someone to talk to, please call 1-800-273-TALK (toll free, 24 hour hotline)  Patient verbalizes understanding of instructions and care plan provided today and agrees to view in MyChart. Active MyChart status and patient understanding of how to access instructions and care plan via MyChart confirmed with patient.     [name] RN, BSN, [facility] [place]  [facility], [facility]  Care Coordinator Phone: [phone]

## 2022-12-04 NOTE — Patient Outreach (Signed)
Care Coordination   Follow Up Visit Note   12/04/2022 Name: James Wall MRN: [id number] DOB: 12-10-1965  James Wall is a 57 y.o. year old male who sees Shelby Mattocks, DO for primary care. I spoke with  James Wall by phone today.  What matters to the patients health and wellness today?   I talked to James Wall this morning, and he seemed a bit down. He is still feeling upset because he recently lost three friends and is dealing with some dialysis-related issues. On a positive note, he mentioned that his blood pressure has gotten better. For the last  month, his readings have been around 170/88 and 160/90, with no low readings at all which is good for him. We went over his medications and discussed how important it is to keep his blood pressure low to reduce the risk of stroke symptoms. He said he is trying to stay calm and keep to himself to avoid stress.    SDOH assessments and interventions completed:  No     Care Coordination Interventions:  Yes, provided   Interventions Today    Flowsheet Row Most Recent Value  Chronic Disease   Chronic disease during today's visit Hypertension (HTN)  General Interventions   General Interventions Discussed/Reviewed General Interventions Discussed  Mental Health Interventions   Mental Health Discussed/Reviewed Mental Health Discussed, Grief and Loss  Pharmacy Interventions   Pharmacy Dicussed/Reviewed Pharmacy Topics Discussed  Safety Interventions   Safety Discussed/Reviewed Safety Discussed        Follow up plan: Follow up call scheduled for 12/25/22  9 am    Encounter Outcome:  Patient Visit Completed   [name] RN, BSN, [facility] [place]  [facility], [facility]  Care Coordinator Phone: [phone]

## 2022-12-05 DIAGNOSIS — Z992 Dependence on renal dialysis: Secondary | ICD-10-CM | POA: Diagnosis not present

## 2022-12-05 DIAGNOSIS — N186 End stage renal disease: Secondary | ICD-10-CM | POA: Diagnosis not present

## 2022-12-05 DIAGNOSIS — N2581 Secondary hyperparathyroidism of renal origin: Secondary | ICD-10-CM | POA: Diagnosis not present

## 2022-12-05 DIAGNOSIS — D509 Iron deficiency anemia, unspecified: Secondary | ICD-10-CM | POA: Diagnosis not present

## 2022-12-05 DIAGNOSIS — T8249XA Other complication of vascular dialysis catheter, initial encounter: Secondary | ICD-10-CM | POA: Diagnosis not present

## 2022-12-05 DIAGNOSIS — R197 Diarrhea, unspecified: Secondary | ICD-10-CM | POA: Diagnosis not present

## 2022-12-05 DIAGNOSIS — D631 Anemia in chronic kidney disease: Secondary | ICD-10-CM | POA: Diagnosis not present

## 2022-12-05 DIAGNOSIS — D689 Coagulation defect, unspecified: Secondary | ICD-10-CM | POA: Diagnosis not present

## 2022-12-08 DIAGNOSIS — Z992 Dependence on renal dialysis: Secondary | ICD-10-CM | POA: Diagnosis not present

## 2022-12-08 DIAGNOSIS — T8249XA Other complication of vascular dialysis catheter, initial encounter: Secondary | ICD-10-CM | POA: Diagnosis not present

## 2022-12-08 DIAGNOSIS — N2581 Secondary hyperparathyroidism of renal origin: Secondary | ICD-10-CM | POA: Diagnosis not present

## 2022-12-08 DIAGNOSIS — R197 Diarrhea, unspecified: Secondary | ICD-10-CM | POA: Diagnosis not present

## 2022-12-08 DIAGNOSIS — D631 Anemia in chronic kidney disease: Secondary | ICD-10-CM | POA: Diagnosis not present

## 2022-12-08 DIAGNOSIS — N186 End stage renal disease: Secondary | ICD-10-CM | POA: Diagnosis not present

## 2022-12-08 DIAGNOSIS — D509 Iron deficiency anemia, unspecified: Secondary | ICD-10-CM | POA: Diagnosis not present

## 2022-12-08 DIAGNOSIS — D689 Coagulation defect, unspecified: Secondary | ICD-10-CM | POA: Diagnosis not present

## 2022-12-10 DIAGNOSIS — Z992 Dependence on renal dialysis: Secondary | ICD-10-CM | POA: Diagnosis not present

## 2022-12-10 DIAGNOSIS — N2581 Secondary hyperparathyroidism of renal origin: Secondary | ICD-10-CM | POA: Diagnosis not present

## 2022-12-10 DIAGNOSIS — D509 Iron deficiency anemia, unspecified: Secondary | ICD-10-CM | POA: Diagnosis not present

## 2022-12-10 DIAGNOSIS — D689 Coagulation defect, unspecified: Secondary | ICD-10-CM | POA: Diagnosis not present

## 2022-12-10 DIAGNOSIS — R197 Diarrhea, unspecified: Secondary | ICD-10-CM | POA: Diagnosis not present

## 2022-12-10 DIAGNOSIS — N186 End stage renal disease: Secondary | ICD-10-CM | POA: Diagnosis not present

## 2022-12-10 DIAGNOSIS — D631 Anemia in chronic kidney disease: Secondary | ICD-10-CM | POA: Diagnosis not present

## 2022-12-10 DIAGNOSIS — T8249XA Other complication of vascular dialysis catheter, initial encounter: Secondary | ICD-10-CM | POA: Diagnosis not present

## 2022-12-12 ENCOUNTER — Telehealth: Payer: Self-pay | Admitting: Internal Medicine

## 2022-12-12 DIAGNOSIS — R197 Diarrhea, unspecified: Secondary | ICD-10-CM | POA: Diagnosis not present

## 2022-12-12 DIAGNOSIS — T8249XA Other complication of vascular dialysis catheter, initial encounter: Secondary | ICD-10-CM | POA: Diagnosis not present

## 2022-12-12 DIAGNOSIS — Z992 Dependence on renal dialysis: Secondary | ICD-10-CM | POA: Diagnosis not present

## 2022-12-12 DIAGNOSIS — D631 Anemia in chronic kidney disease: Secondary | ICD-10-CM | POA: Diagnosis not present

## 2022-12-12 DIAGNOSIS — D509 Iron deficiency anemia, unspecified: Secondary | ICD-10-CM | POA: Diagnosis not present

## 2022-12-12 DIAGNOSIS — N2581 Secondary hyperparathyroidism of renal origin: Secondary | ICD-10-CM | POA: Diagnosis not present

## 2022-12-12 DIAGNOSIS — R002 Palpitations: Secondary | ICD-10-CM | POA: Diagnosis not present

## 2022-12-12 DIAGNOSIS — N186 End stage renal disease: Secondary | ICD-10-CM | POA: Diagnosis not present

## 2022-12-12 DIAGNOSIS — D689 Coagulation defect, unspecified: Secondary | ICD-10-CM | POA: Diagnosis not present

## 2022-12-12 NOTE — Telephone Encounter (Signed)
Zio by Theodore Demark is calling to give abnormal readings.

## 2022-12-12 NOTE — Telephone Encounter (Signed)
   Cardiac Monitor Alert  Date of alert:  12/12/2022   Patient Name: James Wall  DOB: [birth year]  MRN: [id number]   [facility] Cardiologist: Christell Constant, MD  [facility] EP:  None    Monitor Information: Long Term Monitor [ZioXT]  Reason:  SVT Ordering provider:  Dr. Burley Saver   Alert Supraventricular Tachycardia - fastest HR:  197 This is the 1st alert for this rhythm.   Next Cardiology Appointment   Date:  01/13/23  Provider:  Jari Favre, PA  The patient was contacted today.  He is asymptomatic. States his heart races after dialysis appointments.  Arrhythmia, symptoms and history reviewed with Dr. Lynnette Caffey.   Plan:  F/u APP Jari Favre) 1 month (January 13, 2023). Continue to monitor.   Cherylann Banas, [place]  12/12/2022 3:53 PM

## 2022-12-13 ENCOUNTER — Other Ambulatory Visit: Payer: Self-pay | Admitting: Cardiology

## 2022-12-13 DIAGNOSIS — I471 Supraventricular tachycardia, unspecified: Secondary | ICD-10-CM

## 2022-12-15 ENCOUNTER — Telehealth: Payer: Self-pay

## 2022-12-15 DIAGNOSIS — R197 Diarrhea, unspecified: Secondary | ICD-10-CM | POA: Diagnosis not present

## 2022-12-15 DIAGNOSIS — D689 Coagulation defect, unspecified: Secondary | ICD-10-CM | POA: Diagnosis not present

## 2022-12-15 DIAGNOSIS — T8249XA Other complication of vascular dialysis catheter, initial encounter: Secondary | ICD-10-CM | POA: Diagnosis not present

## 2022-12-15 DIAGNOSIS — N2581 Secondary hyperparathyroidism of renal origin: Secondary | ICD-10-CM | POA: Diagnosis not present

## 2022-12-15 DIAGNOSIS — Z992 Dependence on renal dialysis: Secondary | ICD-10-CM | POA: Diagnosis not present

## 2022-12-15 DIAGNOSIS — D631 Anemia in chronic kidney disease: Secondary | ICD-10-CM | POA: Diagnosis not present

## 2022-12-15 DIAGNOSIS — N186 End stage renal disease: Secondary | ICD-10-CM | POA: Diagnosis not present

## 2022-12-15 DIAGNOSIS — D509 Iron deficiency anemia, unspecified: Secondary | ICD-10-CM | POA: Diagnosis not present

## 2022-12-15 NOTE — Telephone Encounter (Signed)
-----   Message from [facility] [place] [facility] sent at 12/15/2022  7:49 AM EST ----- Regarding: FW: Appointment Set Up  ----- Message ----- From: Para March, DO Sent: 12/14/2022   9:04 PM EST To: [facility] Subject: Appointment Set Up                             Hey, do you all mind setting an appointment up for him in the next week to consider starting metoprolol, his PCP is Dr. Royal Piedra, thanks so much! ----- Message ----- From: Nobie Putnam, MD Sent: 12/13/2022   4:57 PM EST To: Para March, DO

## 2022-12-15 NOTE — Progress Notes (Signed)
Spoke with patient on the phone regarding his ZIO monitor results.  He already has an appointment scheduled with cardiology on 12/10.  [name] that he was notified by our clinic that we had an appointment available Friday but he was unavailable so no point appointment was scheduled.  I went ahead and scheduled him an appointment with Dr. Phineas Real on 11/19 at 9:50 AM to ensure that his palpitations have not worsened, and consider starting metoprolol for his SVT prior to meeting with cardiology.  History of multiple chronic conditions and ESRD.  Advised him to go to the ED if he has palpitations that persist or feel different, chest pain, shortness of breath, or any other concerning symptoms.

## 2022-12-15 NOTE — Telephone Encounter (Signed)
Called patient to schedule and appointment for next week.  Stated that couldn't do Friday November 22nd due to dialysis and wouldn't be feeling well Also stated that they will not be seen if they have plans that day and if it is the spur of the moment.  Drusilla Kanner, CMA

## 2022-12-17 DIAGNOSIS — N186 End stage renal disease: Secondary | ICD-10-CM | POA: Diagnosis not present

## 2022-12-17 DIAGNOSIS — R197 Diarrhea, unspecified: Secondary | ICD-10-CM | POA: Diagnosis not present

## 2022-12-17 DIAGNOSIS — N2581 Secondary hyperparathyroidism of renal origin: Secondary | ICD-10-CM | POA: Diagnosis not present

## 2022-12-17 DIAGNOSIS — D509 Iron deficiency anemia, unspecified: Secondary | ICD-10-CM | POA: Diagnosis not present

## 2022-12-17 DIAGNOSIS — D631 Anemia in chronic kidney disease: Secondary | ICD-10-CM | POA: Diagnosis not present

## 2022-12-17 DIAGNOSIS — T8249XA Other complication of vascular dialysis catheter, initial encounter: Secondary | ICD-10-CM | POA: Diagnosis not present

## 2022-12-17 DIAGNOSIS — Z992 Dependence on renal dialysis: Secondary | ICD-10-CM | POA: Diagnosis not present

## 2022-12-17 DIAGNOSIS — D689 Coagulation defect, unspecified: Secondary | ICD-10-CM | POA: Diagnosis not present

## 2022-12-19 DIAGNOSIS — D509 Iron deficiency anemia, unspecified: Secondary | ICD-10-CM | POA: Diagnosis not present

## 2022-12-19 DIAGNOSIS — T8249XA Other complication of vascular dialysis catheter, initial encounter: Secondary | ICD-10-CM | POA: Diagnosis not present

## 2022-12-19 DIAGNOSIS — R197 Diarrhea, unspecified: Secondary | ICD-10-CM | POA: Diagnosis not present

## 2022-12-19 DIAGNOSIS — D631 Anemia in chronic kidney disease: Secondary | ICD-10-CM | POA: Diagnosis not present

## 2022-12-19 DIAGNOSIS — N2581 Secondary hyperparathyroidism of renal origin: Secondary | ICD-10-CM | POA: Diagnosis not present

## 2022-12-19 DIAGNOSIS — N186 End stage renal disease: Secondary | ICD-10-CM | POA: Diagnosis not present

## 2022-12-19 DIAGNOSIS — D689 Coagulation defect, unspecified: Secondary | ICD-10-CM | POA: Diagnosis not present

## 2022-12-19 DIAGNOSIS — Z992 Dependence on renal dialysis: Secondary | ICD-10-CM | POA: Diagnosis not present

## 2022-12-22 DIAGNOSIS — D509 Iron deficiency anemia, unspecified: Secondary | ICD-10-CM | POA: Diagnosis not present

## 2022-12-22 DIAGNOSIS — Z992 Dependence on renal dialysis: Secondary | ICD-10-CM | POA: Diagnosis not present

## 2022-12-22 DIAGNOSIS — D689 Coagulation defect, unspecified: Secondary | ICD-10-CM | POA: Diagnosis not present

## 2022-12-22 DIAGNOSIS — D631 Anemia in chronic kidney disease: Secondary | ICD-10-CM | POA: Diagnosis not present

## 2022-12-22 DIAGNOSIS — R197 Diarrhea, unspecified: Secondary | ICD-10-CM | POA: Diagnosis not present

## 2022-12-22 DIAGNOSIS — N2581 Secondary hyperparathyroidism of renal origin: Secondary | ICD-10-CM | POA: Diagnosis not present

## 2022-12-22 DIAGNOSIS — N186 End stage renal disease: Secondary | ICD-10-CM | POA: Diagnosis not present

## 2022-12-22 DIAGNOSIS — T8249XA Other complication of vascular dialysis catheter, initial encounter: Secondary | ICD-10-CM | POA: Diagnosis not present

## 2022-12-23 ENCOUNTER — Encounter: Payer: Self-pay | Admitting: Family Medicine

## 2022-12-23 ENCOUNTER — Ambulatory Visit: Payer: Self-pay | Admitting: Family Medicine

## 2022-12-23 ENCOUNTER — Ambulatory Visit: Payer: 59 | Admitting: Family Medicine

## 2022-12-23 VITALS — BP 191/105 | HR 74 | Ht 65.0 in

## 2022-12-23 DIAGNOSIS — I110 Hypertensive heart disease with heart failure: Secondary | ICD-10-CM | POA: Diagnosis not present

## 2022-12-23 DIAGNOSIS — R4589 Other symptoms and signs involving emotional state: Secondary | ICD-10-CM | POA: Diagnosis not present

## 2022-12-23 DIAGNOSIS — R002 Palpitations: Secondary | ICD-10-CM | POA: Diagnosis not present

## 2022-12-23 NOTE — Progress Notes (Signed)
    SUBJECTIVE:   CHIEF COMPLAINT / HPI: f/u palpitations  Gets dialysis MWF. Went yesterday. Dialysis went okay. States he still had palpitations after dialysis session yesterday. This has been ongoing and is why he originally got a zio patch. BP high today. States it is because he was aggravated with driving situation today. States good for him is 150/80. Zio patch showed SVT, has follow-up with Cardiology on 12/10. Reports that his appointment today is to start a new medication for his heart rate. Per chart review this possible means metoprolol; however, his med list already has metoprolol. Patient does not know the names of the medications he takes and is unsure if he is taking metoprolol. Reports compliance with BP meds but cannot name them. Is using clonidine patch.  Patient expresses extreme frustration at difficulty today making it to medical appointment.  After his driver took him to the wrong facility this morning.  And he had to reschedule.  Patient endorses that he is tired of fighting for his health medically going to and from dialysis every day.  He has been doing dialysis for 15 years and does not have significant family to rely on as his relationship with his sister has gotten distant over the past couple years.  His mother passed away also from renal failure in 2016.  Reports that he currently views stopping dialysis and she is in hospice as a form of SI and due to his religious beliefs he has been reluctant to stop dialysis.  He denies any thoughts of harming himself or others.  Open to palliative consult.  PERTINENT  PMH / PSH: HTN, CHF, Asthma, COPD, GERD, Cirrhosis, ESRD  OBJECTIVE:   BP (!) 191/105   Pulse 74   Ht 5\' 5"  (1.651 m)   SpO2 100%   BMI 32.48 kg/m   General: NAD, well appearing, visibly upset  Neuro: A&O Cardiovascular: warm, well perfused, radial pulse 2+ regular Respiratory: normal WOB on RA Extremities: Moving all 4 extremities equally   ASSESSMENT/PLAN:    Assessment & Plan Palpitations Discussed with patient that in order to appropriately treat his SVT, it is important to know whether or not he is already taking metoprolol. Given instructions to patient to verify this at home and then call the clinic with this information.   Per medication dispense history Metoprolol was last dispensed in April for 90 day supply and patient should be out. Plan to restart if this is the case and increase dose if he is taking and has filled else where.  Depressed mood No passive or active SI. Understandably struggling mentally given medical extent of medical problems. Discussed talking with palliative care for goals of care, given his thoughts today on continuing dialysis. Patient agreeable. -Palliative care consulted placed Hypertensive heart disease with heart failure (HCC) Patient blood pressure elevated highly times x2. Of note patient visibly upset and crying through visit due to above, and suspect this falsely elevated blood pressure. Blood pressure management per Nephrology, will refill medications if patient needs.  Return in about 1 month (around 01/22/2023).  Celine Mans, MD [facility]

## 2022-12-23 NOTE — Assessment & Plan Note (Signed)
Discussed with patient that in order to appropriately treat his SVT, it is important to know whether or not he is already taking metoprolol. Given instructions to patient to verify this at home and then call the clinic with this information.   Per medication dispense history Metoprolol was last dispensed in April for 90 day supply and patient should be out. Plan to restart if this is the case and increase dose if he is taking and has filled else where.

## 2022-12-23 NOTE — Patient Instructions (Signed)
It was great to see you! Thank you for allowing me to participate in your care!  Our plans for today:  -I have placed a palliative medicine consult they should call you to schedule a few days I think this will ultimately we one of the best things you can do to help improve and determine how you want to live the rest of your life. -Please go home and look at your medicine list and see if you are taking metoprolol, please call the clinic to let me know if you are taking it so that I may adjust or start new medication for you -Please make sure to go to your cardiology appointment on 01/13/2023  Please arrive 15 minutes PRIOR to your next scheduled appointment time! If you do not, this affects OTHER patients' care.  Take care and seek immediate care sooner if you develop any concerns.   Celine Mans, MD, PGY-2 [facility] 2:29 PM 12/23/2022  [facility] Family [facility]

## 2022-12-23 NOTE — Assessment & Plan Note (Signed)
Patient blood pressure elevated highly times x2. Of note patient visibly upset and crying through visit due to above, and suspect this falsely elevated blood pressure. Blood pressure management per Nephrology, will refill medications if patient needs.

## 2022-12-23 NOTE — Telephone Encounter (Signed)
Patient calls nurse line regarding appointment for today with Dr. Phineas Real.   He apologizes about missing appointment as there was an issue with his Biomedical scientist.   He states that he can only come into the office on Tuesday and Thursday due to dialysis.  Rescheduled for this afternoon with Dr. Velna Ochs at 1:50.  Veronda Prude, RN

## 2022-12-24 ENCOUNTER — Telehealth: Payer: Self-pay

## 2022-12-24 DIAGNOSIS — D631 Anemia in chronic kidney disease: Secondary | ICD-10-CM | POA: Diagnosis not present

## 2022-12-24 DIAGNOSIS — Z992 Dependence on renal dialysis: Secondary | ICD-10-CM | POA: Diagnosis not present

## 2022-12-24 DIAGNOSIS — D689 Coagulation defect, unspecified: Secondary | ICD-10-CM | POA: Diagnosis not present

## 2022-12-24 DIAGNOSIS — N186 End stage renal disease: Secondary | ICD-10-CM | POA: Diagnosis not present

## 2022-12-24 DIAGNOSIS — N2581 Secondary hyperparathyroidism of renal origin: Secondary | ICD-10-CM | POA: Diagnosis not present

## 2022-12-24 DIAGNOSIS — R197 Diarrhea, unspecified: Secondary | ICD-10-CM | POA: Diagnosis not present

## 2022-12-24 DIAGNOSIS — D509 Iron deficiency anemia, unspecified: Secondary | ICD-10-CM | POA: Diagnosis not present

## 2022-12-24 DIAGNOSIS — T8249XA Other complication of vascular dialysis catheter, initial encounter: Secondary | ICD-10-CM | POA: Diagnosis not present

## 2022-12-24 NOTE — Telephone Encounter (Signed)
[facility] LVM on nurse line in regards to Palliative care.   Neysa Bonito reports they will be following patient for Palliative care needs.   She will call for orders as needed.

## 2022-12-25 ENCOUNTER — Ambulatory Visit: Payer: Self-pay

## 2022-12-25 NOTE — Patient Outreach (Signed)
  Care Coordination   Follow Up Visit Note   12/25/2022 Name: James Wall MRN: [id number] DOB: [birth year]  James Wall is a 57 y.o. year old male who sees James Mattocks, DO for primary care. I spoke with  James Wall by phone today.  What matters to the patients health and wellness today?  I spoke with James Wall this morning, and he is still feeling down. He expressed that he is quite tired of dealing with healthcare challenges. We discussed ways to help keep his stress levels down, as he is currently in a spiritual mindset. His blood pressure yesterday was 191/105, and they are considering starting him on medication. He has a cardiology appointment scheduled for December 10th to discuss his heart rhythm, specifically SVT. James Wall is working on improving his outlook on his health. I also informed him that I will no longer be his care manager; he has a new care manager named James Wall.    Goals Addressed             This Visit's Progress    I was just out of the hospital for stroke       Patient Goals/Self Care Activities: -Calls provider office for new concerns, questions, or BP outside discussed parameters -Checks BP and record as discussed Assessed for signs and symptoms of stroke Active listening / Reflection utilized  Emotional Support Provided Problem Solving /Task Center strategies reviewed BP Readings from Last 3 Encounters:  12/23/22 (!) 191/105  11/06/22 100/63  10/22/22 (!) 132/92  Reviewed Importance of taking all medications as prescribed Reviewed Importance of attending all scheduled provider appointments           SDOH assessments and interventions completed:  No     Care Coordination Interventions:  Yes, provided   Interventions Today    Flowsheet Row Most Recent Value  Chronic Disease   Chronic disease during today's visit Hypertension (HTN)  General Interventions   General Interventions Discussed/Reviewed  General Interventions Discussed  Mental Health Interventions   Mental Health Discussed/Reviewed Mental Health Discussed  Pharmacy Interventions   Pharmacy Dicussed/Reviewed Pharmacy Topics Discussed  Safety Interventions   Safety Discussed/Reviewed Safety Discussed        Follow up plan: No further intervention required.   Encounter Outcome:  Patient Visit Completed   James Fairly RN, BSN, [facility] [place]  [facility], [facility]  Care Coordinator Phone: [phone]

## 2022-12-25 NOTE — Patient Instructions (Signed)
Visit Information  Thank you for taking time to visit with me today. Please don't hesitate to contact me if I can be of assistance to you.   Following are the goals we discussed today:   Goals Addressed             This Visit's Progress    I was just out of the hospital for stroke       Patient Goals/Self Care Activities: -Calls provider office for new concerns, questions, or BP outside discussed parameters -Checks BP and record as discussed Assessed for signs and symptoms of stroke Active listening / Reflection utilized  Emotional Support Provided Problem Solving /Task Center strategies reviewed BP Readings from Last 3 Encounters:  12/23/22 (!) 191/105  11/06/22 100/63  10/22/22 (!) 132/92  Reviewed Importance of taking all medications as prescribed Reviewed Importance of attending all scheduled provider appointments           If you are experiencing a Mental Health or Behavioral Health Crisis or need someone to talk to, please call 1-800-273-TALK (toll free, 24 hour hotline)  Patient verbalizes understanding of instructions and care plan provided today and agrees to view in MyChart. Active MyChart status and patient understanding of how to access instructions and care plan via MyChart confirmed with patient.     [name] RN, BSN, [facility] [place]  [facility], [facility]  Care Coordinator Phone: [phone]

## 2022-12-26 DIAGNOSIS — Z992 Dependence on renal dialysis: Secondary | ICD-10-CM | POA: Diagnosis not present

## 2022-12-26 DIAGNOSIS — N186 End stage renal disease: Secondary | ICD-10-CM | POA: Diagnosis not present

## 2022-12-26 DIAGNOSIS — T8249XA Other complication of vascular dialysis catheter, initial encounter: Secondary | ICD-10-CM | POA: Diagnosis not present

## 2022-12-26 DIAGNOSIS — D631 Anemia in chronic kidney disease: Secondary | ICD-10-CM | POA: Diagnosis not present

## 2022-12-26 DIAGNOSIS — D689 Coagulation defect, unspecified: Secondary | ICD-10-CM | POA: Diagnosis not present

## 2022-12-26 DIAGNOSIS — R197 Diarrhea, unspecified: Secondary | ICD-10-CM | POA: Diagnosis not present

## 2022-12-26 DIAGNOSIS — D509 Iron deficiency anemia, unspecified: Secondary | ICD-10-CM | POA: Diagnosis not present

## 2022-12-26 DIAGNOSIS — N2581 Secondary hyperparathyroidism of renal origin: Secondary | ICD-10-CM | POA: Diagnosis not present

## 2022-12-29 NOTE — Telephone Encounter (Signed)
Received call from [facility] regarding palliative care services.   Patient is denying palliative services at this time.   He was really just wanting help finding a place to live. He has to be out of his current house by the end of December.   Patient is established with [facility]. Will forward message to PCP.   Veronda Prude, RN

## 2022-12-30 ENCOUNTER — Other Ambulatory Visit: Payer: Self-pay | Admitting: Student

## 2022-12-30 DIAGNOSIS — R197 Diarrhea, unspecified: Secondary | ICD-10-CM | POA: Diagnosis not present

## 2022-12-30 DIAGNOSIS — D631 Anemia in chronic kidney disease: Secondary | ICD-10-CM | POA: Diagnosis not present

## 2022-12-30 DIAGNOSIS — N2581 Secondary hyperparathyroidism of renal origin: Secondary | ICD-10-CM | POA: Diagnosis not present

## 2022-12-30 DIAGNOSIS — Z992 Dependence on renal dialysis: Secondary | ICD-10-CM | POA: Diagnosis not present

## 2022-12-30 DIAGNOSIS — D509 Iron deficiency anemia, unspecified: Secondary | ICD-10-CM | POA: Diagnosis not present

## 2022-12-30 DIAGNOSIS — D689 Coagulation defect, unspecified: Secondary | ICD-10-CM | POA: Diagnosis not present

## 2022-12-30 DIAGNOSIS — T8249XA Other complication of vascular dialysis catheter, initial encounter: Secondary | ICD-10-CM | POA: Diagnosis not present

## 2022-12-30 DIAGNOSIS — N186 End stage renal disease: Secondary | ICD-10-CM | POA: Diagnosis not present

## 2022-12-31 ENCOUNTER — Telehealth: Payer: Self-pay

## 2022-12-31 DIAGNOSIS — J449 Chronic obstructive pulmonary disease, unspecified: Secondary | ICD-10-CM

## 2022-12-31 DIAGNOSIS — I639 Cerebral infarction, unspecified: Secondary | ICD-10-CM

## 2022-12-31 DIAGNOSIS — Z992 Dependence on renal dialysis: Secondary | ICD-10-CM

## 2022-12-31 DIAGNOSIS — Z789 Other specified health status: Secondary | ICD-10-CM

## 2022-12-31 NOTE — Patient Outreach (Signed)
  Care Coordination   Follow Up Visit Note   12/31/2022 Name: James Wall MRN: [id number] DOB: [birth year]  James Wall is a 57 y.o. year old male who sees James Mattocks, DO for primary care. The Patient was not interviewed or contacted during this encounter.     What matters to the patients health and wellness today?  RNCM has received a request for assistance in locating suitable housing for a patient. The patient is required to vacate his current residence by the end of December and would greatly appreciate any support that can be provided in this matter.      SDOH assessments and interventions completed:  Yes     Care Coordination Interventions:  Yes, provided {THN Tip this will not be part of the note when signed-REQUIRED REPORT FIELD DO NOT DELETE (Optional):27901  Interventions Today    Flowsheet Row Most Recent Value  Chronic Disease   Chronic disease during today's visit Chronic Obstructive Pulmonary Disease (COPD), Hypertension (HTN), Other  [Stroke]  General Interventions   General Interventions Discussed/Reviewed General Interventions Discussed, Communication with  [PCP office/ care guides]       Follow up plan: No further intervention required.   Encounter Outcome:  Patient Visit Completed   James Fairly RN, BSN, [facility] [place]  [facility], [facility]  Care Coordinator Phone: [phone]

## 2022-12-31 NOTE — Progress Notes (Signed)
This encounter was created in error - please disregard.

## 2023-01-02 DIAGNOSIS — R197 Diarrhea, unspecified: Secondary | ICD-10-CM | POA: Diagnosis not present

## 2023-01-02 DIAGNOSIS — D631 Anemia in chronic kidney disease: Secondary | ICD-10-CM | POA: Diagnosis not present

## 2023-01-02 DIAGNOSIS — N2581 Secondary hyperparathyroidism of renal origin: Secondary | ICD-10-CM | POA: Diagnosis not present

## 2023-01-02 DIAGNOSIS — D689 Coagulation defect, unspecified: Secondary | ICD-10-CM | POA: Diagnosis not present

## 2023-01-02 DIAGNOSIS — D509 Iron deficiency anemia, unspecified: Secondary | ICD-10-CM | POA: Diagnosis not present

## 2023-01-02 DIAGNOSIS — N186 End stage renal disease: Secondary | ICD-10-CM | POA: Diagnosis not present

## 2023-01-02 DIAGNOSIS — Z992 Dependence on renal dialysis: Secondary | ICD-10-CM | POA: Diagnosis not present

## 2023-01-02 DIAGNOSIS — T8249XA Other complication of vascular dialysis catheter, initial encounter: Secondary | ICD-10-CM | POA: Diagnosis not present

## 2023-01-03 DIAGNOSIS — N186 End stage renal disease: Secondary | ICD-10-CM | POA: Diagnosis not present

## 2023-01-03 DIAGNOSIS — Z992 Dependence on renal dialysis: Secondary | ICD-10-CM | POA: Diagnosis not present

## 2023-01-03 DIAGNOSIS — I12 Hypertensive chronic kidney disease with 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: Secondary | ICD-10-CM | POA: Diagnosis not present

## 2023-01-05 DIAGNOSIS — N2581 Secondary hyperparathyroidism of renal origin: Secondary | ICD-10-CM | POA: Diagnosis not present

## 2023-01-05 DIAGNOSIS — T8249XA Other complication of vascular dialysis catheter, initial encounter: Secondary | ICD-10-CM | POA: Diagnosis not present

## 2023-01-05 DIAGNOSIS — D631 Anemia in chronic kidney disease: Secondary | ICD-10-CM | POA: Diagnosis not present

## 2023-01-05 DIAGNOSIS — N186 End stage renal disease: Secondary | ICD-10-CM | POA: Diagnosis not present

## 2023-01-05 DIAGNOSIS — D689 Coagulation defect, unspecified: Secondary | ICD-10-CM | POA: Diagnosis not present

## 2023-01-05 DIAGNOSIS — D509 Iron deficiency anemia, unspecified: Secondary | ICD-10-CM | POA: Diagnosis not present

## 2023-01-05 DIAGNOSIS — Z992 Dependence on renal dialysis: Secondary | ICD-10-CM | POA: Diagnosis not present

## 2023-01-06 ENCOUNTER — Ambulatory Visit: Payer: Self-pay | Admitting: Licensed Clinical Social Worker

## 2023-01-07 DIAGNOSIS — D689 Coagulation defect, unspecified: Secondary | ICD-10-CM | POA: Diagnosis not present

## 2023-01-07 DIAGNOSIS — D631 Anemia in chronic kidney disease: Secondary | ICD-10-CM | POA: Diagnosis not present

## 2023-01-07 DIAGNOSIS — T8249XA Other complication of vascular dialysis catheter, initial encounter: Secondary | ICD-10-CM | POA: Diagnosis not present

## 2023-01-07 DIAGNOSIS — Z992 Dependence on renal dialysis: Secondary | ICD-10-CM | POA: Diagnosis not present

## 2023-01-07 DIAGNOSIS — D509 Iron deficiency anemia, unspecified: Secondary | ICD-10-CM | POA: Diagnosis not present

## 2023-01-07 DIAGNOSIS — N2581 Secondary hyperparathyroidism of renal origin: Secondary | ICD-10-CM | POA: Diagnosis not present

## 2023-01-07 DIAGNOSIS — N186 End stage renal disease: Secondary | ICD-10-CM | POA: Diagnosis not present

## 2023-01-07 NOTE — Patient Outreach (Signed)
  Care Coordination   Initial Visit Note   01/06/2023 Name: James Wall MRN: [id number] DOB: [birth year]  Alvaro Currin Vautour is a 57 y.o. year old male who sees Shelby Mattocks, DO for primary care. I spoke with  [name] by phone today.  What matters to the patients health and wellness today?  LCSW A Glendola Friedhoff and Mr. Whitset completed initial visit. Mr. Makovec was advised of local housing resources.     Goals Addressed             This Visit's Progress    Care Coordination       Activities and task to complete in order to accomplish goals.   Follow up with housing resources discussed ([address], [facility] and [place]) Complete SCAT application lcsw will complete You have decided not to move forward with counseling and would like to work with me on brief coping skills to assist you with managing grief and depressive symptoms  Start / continue relaxed breathing 3 times daily         SDOH assessments and interventions completed:  Yes     Care Coordination Interventions:  Yes, provided   Interventions Today    Flowsheet Row Most Recent Value  Chronic Disease   Chronic disease during today's visit Chronic Kidney Disease/End Stage Renal Disease (ESRD)  General Interventions   General Interventions Discussed/Reviewed [employer] pt of housing resources]  Education Interventions   Education Provided Provided Education  [advised pt of housing resources and transportation]  Provided Engineer, [employer]  Mental Health Interventions   Mental Health Discussed/Reviewed Coping Strategies, Grief and Loss  [Pt is grieving loss of mother, home and health]        Follow up plan: Follow up call scheduled for 01/20/2023    Encounter Outcome:  Patient Visit Completed    Gwyndolyn Saxon MSW, LCSW Licensed Clinical Social Worker  [facility], Population Health Direct Dial:  [phone]  Fax: [phone]

## 2023-01-09 DIAGNOSIS — N186 End stage renal disease: Secondary | ICD-10-CM | POA: Diagnosis not present

## 2023-01-09 DIAGNOSIS — Z992 Dependence on renal dialysis: Secondary | ICD-10-CM | POA: Diagnosis not present

## 2023-01-09 DIAGNOSIS — D689 Coagulation defect, unspecified: Secondary | ICD-10-CM | POA: Diagnosis not present

## 2023-01-09 DIAGNOSIS — D631 Anemia in chronic kidney disease: Secondary | ICD-10-CM | POA: Diagnosis not present

## 2023-01-09 DIAGNOSIS — T8249XA Other complication of vascular dialysis catheter, initial encounter: Secondary | ICD-10-CM | POA: Diagnosis not present

## 2023-01-09 DIAGNOSIS — D509 Iron deficiency anemia, unspecified: Secondary | ICD-10-CM | POA: Diagnosis not present

## 2023-01-09 DIAGNOSIS — N2581 Secondary hyperparathyroidism of renal origin: Secondary | ICD-10-CM | POA: Diagnosis not present

## 2023-01-12 DIAGNOSIS — T8249XA Other complication of vascular dialysis catheter, initial encounter: Secondary | ICD-10-CM | POA: Diagnosis not present

## 2023-01-12 DIAGNOSIS — D631 Anemia in chronic kidney disease: Secondary | ICD-10-CM | POA: Diagnosis not present

## 2023-01-12 DIAGNOSIS — N186 End stage renal disease: Secondary | ICD-10-CM | POA: Diagnosis not present

## 2023-01-12 DIAGNOSIS — D509 Iron deficiency anemia, unspecified: Secondary | ICD-10-CM | POA: Diagnosis not present

## 2023-01-12 DIAGNOSIS — N2581 Secondary hyperparathyroidism of renal origin: Secondary | ICD-10-CM | POA: Diagnosis not present

## 2023-01-12 DIAGNOSIS — Z992 Dependence on renal dialysis: Secondary | ICD-10-CM | POA: Diagnosis not present

## 2023-01-12 DIAGNOSIS — D689 Coagulation defect, unspecified: Secondary | ICD-10-CM | POA: Diagnosis not present

## 2023-01-13 ENCOUNTER — Encounter: Payer: Self-pay | Admitting: Physician Assistant

## 2023-01-13 ENCOUNTER — Ambulatory Visit: Payer: 59 | Attending: Physician Assistant | Admitting: Physician Assistant

## 2023-01-13 VITALS — BP 118/78 | HR 82 | Ht 65.0 in | Wt 208.4 lb

## 2023-01-13 DIAGNOSIS — Z720 Tobacco use: Secondary | ICD-10-CM

## 2023-01-13 DIAGNOSIS — I2729 Other secondary pulmonary hypertension: Secondary | ICD-10-CM

## 2023-01-13 DIAGNOSIS — Z992 Dependence on renal dialysis: Secondary | ICD-10-CM

## 2023-01-13 DIAGNOSIS — I5032 Chronic diastolic (congestive) heart failure: Secondary | ICD-10-CM

## 2023-01-13 DIAGNOSIS — N186 End stage renal disease: Secondary | ICD-10-CM

## 2023-01-13 DIAGNOSIS — I10 Essential (primary) hypertension: Secondary | ICD-10-CM

## 2023-01-13 DIAGNOSIS — I639 Cerebral infarction, unspecified: Secondary | ICD-10-CM

## 2023-01-13 NOTE — Progress Notes (Unsigned)
Cardiology Office Note:  .   Date:  01/13/2023  ID:  James Wall, DOB 06-01-1965, MRN [id number] PCP: Shelby Mattocks, DO  [facility] Providers Cardiologist:  Christell Constant, MD {  History of Present Illness: .   James Wall is a 57 y.o. male with a past medical history of COPD and active tobacco abuse, ESRD via HD cath, HTN, HLD, PH NOS, history of cirrhosis, HFmrEF who presents for follow-up appointment.  In 2022 had improved his LVEF.  Still elevated blood pressure.  Lost to follow-up.  In 2023 and a noncardiac encounter blood pressure was still elevated.  Patient was last seen 10/22/2021 at that time was doing about the same.  Since last visit notes he had a stroke.  Diagnosed with prostate cancer and is finished with radiation.  No chest pain or pressure.  No SOB/DOE and no PND/orthopnea.  No weight gain or leg swelling.  No palpitations or syncope.  1 4 and only, he is still smoking.  Has had some the left residual weakness from the stroke.  Ambulatory blood pressure is better per patient, no longer in 200s systolic.  Today, he***  ROS: Pertinent ROS in HPI  Studies Reviewed: .        Cardiac Event Monitoring: Date: 08/01/2019 Results: Patient had a min HR of 53 bpm, max HR of 148 bpm, and avg HR of 62 BPM. 7 Supraventricular Tachycardia runs occurred, the longest lasting 18 beats with an avg rate of 112 bpm.   Predominant underlying rhythm was Sinus Rhythm. Few very short runs of SVTs that don't correlate with patient's symptoms. Rare PVCs, PACs, 1 blocked sinus beat.   Continue the same management.   ECHO COMPLETE WO IMAGING ENHANCING AGENT 09/15/2021   Narrative ECHOCARDIOGRAM REPORT       Patient Name:   James Wall [facility] Date of Exam: 09/15/2021 Medical Rec #:  [id number]           Height:       65.0 in Accession #:    [id number]          Weight:       187.0 lb Date of Birth:  [birth year]           BSA:          1.922 m Patient  Age:    56 years            BP:           170/100 mmHg Patient Gender: M                   HR:           56 bpm. Exam Location:  Inpatient   Procedure: 2D Echo   Indications:    stroke   History:        Patient has prior history of Echocardiogram examinations, most recent 09/13/2020. COPD and end stage renal disease, Arrythmias:first degree heart block; Risk Factors:Hypertension and Sleep Apnea.   Sonographer:    Delcie Roch RDCS Referring Phys: [id number] KEVIN STEINL   IMPRESSIONS     1. Left ventricular ejection fraction, by estimation, is 55 to 60%. The left ventricle has normal function. The left ventricle has no regional wall motion abnormalities. There is moderate concentric left ventricular hypertrophy. Left ventricular diastolic parameters are consistent with Grade III diastolic dysfunction (restrictive). Elevated left atrial pressure. 2. Right ventricular systolic function is normal. The right ventricular size is normal. There  is severely elevated pulmonary artery systolic pressure. The estimated right ventricular systolic pressure is 91.0 mmHg. 3. Left atrial size was severely dilated. 4. The mitral valve is normal in structure. Mild to moderate mitral valve regurgitation. No evidence of mitral stenosis. 5. Tricuspid valve regurgitation is mild to moderate. 6. The aortic valve is tricuspid. Aortic valve regurgitation is not visualized. No aortic stenosis is present. 7. The inferior vena cava is dilated in size with <50% respiratory variability, suggesting right atrial pressure of 15 mmHg.   Comparison(s): RVSP is worse than prior; stable LV function.   FINDINGS Left Ventricle: Left ventricular ejection fraction, by estimation, is 55 to 60%. The left ventricle has normal function. The left ventricle has no regional wall motion abnormalities. The left ventricular internal cavity size was normal in size. There is moderate concentric left ventricular hypertrophy. Left  ventricular diastolic parameters are consistent with Grade III diastolic dysfunction (restrictive). Elevated left atrial pressure.   Right Ventricle: The right ventricular size is normal. No increase in right ventricular wall thickness. Right ventricular systolic function is normal. There is severely elevated pulmonary artery systolic pressure. The tricuspid regurgitant velocity is 4.36 m/s, and with an assumed right atrial pressure of 15 mmHg, the estimated right ventricular systolic pressure is 91.0 mmHg.   Left Atrium: Left atrial size was severely dilated.   Right Atrium: Right atrial size was normal in size.   Pericardium: Trivial pericardial effusion is present.   Mitral Valve: The mitral valve is normal in structure. Mild mitral annular calcification. Mild to moderate mitral valve regurgitation. No evidence of mitral valve stenosis.   Tricuspid Valve: The tricuspid valve is normal in structure. Tricuspid valve regurgitation is mild to moderate. No evidence of tricuspid stenosis.   Aortic Valve: The aortic valve is tricuspid. Aortic valve regurgitation is not visualized. No aortic stenosis is present.   Pulmonic Valve: The pulmonic valve was normal in structure. Pulmonic valve regurgitation is not visualized. No evidence of pulmonic stenosis.   Aorta: The aortic root and ascending aorta are structurally normal, with no evidence of dilitation.   Venous: The inferior vena cava is dilated in size with less than 50% respiratory variability, suggesting right atrial pressure of 15 mmHg.   IAS/Shunts: No atrial level shunt detected by color flow Doppler.     Risk Assessment/Calculations:   {Does this patient have ATRIAL FIBRILLATION?:[id number]} No BP recorded.  {Refresh Note OR Click here to enter BP  :1}***       Physical Exam:   VS:  There were no vitals taken for this visit.   Wt Readings from Last 3 Encounters:  11/06/22 195 lb 3.2 oz (88.5 kg)  10/22/22 189 lb 2.5 oz (85.8 kg)   06/17/22 187 lb (84.8 kg)    GEN: Well nourished, well developed in no acute distress NECK: No JVD; No carotid bruits CARDIAC: ***RRR, no murmurs, rubs, gallops RESPIRATORY:  Clear to auscultation without rales, wheezing or rhonchi  ABDOMEN: Soft, non-tender, non-distended EXTREMITIES:  No edema; No deformity   ASSESSMENT AND PLAN: .   Chronic HFpEF Tobacco abuse Essential hypertension ESRD on dialysis Pulmonary hypertension    {Are you ordering a CV Procedure (e.g. stress test, cath, DCCV, TEE, etc)?   Press F2        :[id number]}  Dispo: ***  Signed, Sharlene Dory, PA-C

## 2023-01-14 DIAGNOSIS — D509 Iron deficiency anemia, unspecified: Secondary | ICD-10-CM | POA: Diagnosis not present

## 2023-01-14 DIAGNOSIS — Z992 Dependence on renal dialysis: Secondary | ICD-10-CM | POA: Diagnosis not present

## 2023-01-14 DIAGNOSIS — N2581 Secondary hyperparathyroidism of renal origin: Secondary | ICD-10-CM | POA: Diagnosis not present

## 2023-01-14 DIAGNOSIS — T8249XA Other complication of vascular dialysis catheter, initial encounter: Secondary | ICD-10-CM | POA: Diagnosis not present

## 2023-01-14 DIAGNOSIS — D631 Anemia in chronic kidney disease: Secondary | ICD-10-CM | POA: Diagnosis not present

## 2023-01-14 DIAGNOSIS — D689 Coagulation defect, unspecified: Secondary | ICD-10-CM | POA: Diagnosis not present

## 2023-01-14 DIAGNOSIS — N186 End stage renal disease: Secondary | ICD-10-CM | POA: Diagnosis not present

## 2023-01-16 DIAGNOSIS — D689 Coagulation defect, unspecified: Secondary | ICD-10-CM | POA: Diagnosis not present

## 2023-01-16 DIAGNOSIS — T8249XA Other complication of vascular dialysis catheter, initial encounter: Secondary | ICD-10-CM | POA: Diagnosis not present

## 2023-01-16 DIAGNOSIS — Z992 Dependence on renal dialysis: Secondary | ICD-10-CM | POA: Diagnosis not present

## 2023-01-16 DIAGNOSIS — D631 Anemia in chronic kidney disease: Secondary | ICD-10-CM | POA: Diagnosis not present

## 2023-01-16 DIAGNOSIS — D509 Iron deficiency anemia, unspecified: Secondary | ICD-10-CM | POA: Diagnosis not present

## 2023-01-16 DIAGNOSIS — N2581 Secondary hyperparathyroidism of renal origin: Secondary | ICD-10-CM | POA: Diagnosis not present

## 2023-01-16 DIAGNOSIS — N186 End stage renal disease: Secondary | ICD-10-CM | POA: Diagnosis not present

## 2023-01-19 DIAGNOSIS — Z992 Dependence on renal dialysis: Secondary | ICD-10-CM | POA: Diagnosis not present

## 2023-01-19 DIAGNOSIS — D509 Iron deficiency anemia, unspecified: Secondary | ICD-10-CM | POA: Diagnosis not present

## 2023-01-19 DIAGNOSIS — N186 End stage renal disease: Secondary | ICD-10-CM | POA: Diagnosis not present

## 2023-01-19 DIAGNOSIS — N2581 Secondary hyperparathyroidism of renal origin: Secondary | ICD-10-CM | POA: Diagnosis not present

## 2023-01-19 DIAGNOSIS — D689 Coagulation defect, unspecified: Secondary | ICD-10-CM | POA: Diagnosis not present

## 2023-01-19 DIAGNOSIS — D631 Anemia in chronic kidney disease: Secondary | ICD-10-CM | POA: Diagnosis not present

## 2023-01-19 DIAGNOSIS — T8249XA Other complication of vascular dialysis catheter, initial encounter: Secondary | ICD-10-CM | POA: Diagnosis not present

## 2023-01-20 ENCOUNTER — Other Ambulatory Visit: Payer: Self-pay | Admitting: *Deleted

## 2023-01-20 ENCOUNTER — Ambulatory Visit: Payer: Self-pay | Admitting: Licensed Clinical Social Worker

## 2023-01-20 NOTE — Patient Instructions (Addendum)
Visit Information  Thank you for taking time to visit with me today. Please don't hesitate to contact me if I can be of assistance to you before our next scheduled telephone appointment.  Following are the goals we discussed today:   Goals Addressed             This Visit's Progress    RNCM Care Management Expected Outcomes: Monitor, Self-Manage and Reduce Symptoms of: HTN       Current Barriers:  Care Coordination needs related to Housing barriers Chronic Disease Management support and education needs related to HTN   RNCM Clinical Goal(s):  Patient will verbalize basic understanding of  HTN disease process and self health management plan as evidenced by verbal explanation, recognizing symptoms, lifestyle modifications take all medications exactly as prescribed and will call provider for medication related questions as evidenced by compliance with all medications attend all scheduled medical appointments: with primary care provider and specialist as evidenced by keeping all scheduled appointments demonstrate Improved and Ongoing adherence to prescribed treatment plan for HTN as evidenced by consistent medication compliance, symptom monitoring, continued lifestyle modifications continue to work with RN Care Manager to address care management and care coordination needs related to  HTN as evidenced by adherence to CM Team Scheduled appointments through collaboration with RN Care manager, provider, and care team.   Interventions: Evaluation of current treatment plan related to  self management and patient's adherence to plan as established by provider   Hypertension Interventions:  (Status:  New goal. and Goal on track:  Yes.) Long Term Goal Last practice recorded BP readings:  BP Readings from Last 3 Encounters:  01/13/23 118/78  12/23/22 (!) 191/105  11/06/22 100/63   Most recent eGFR/CrCl:  Lab Results  Component Value Date   EGFR 4 (L) 11/06/2022    No components found for:  "CRCL"  Evaluation of current treatment plan related to hypertension self management and patient's adherence to plan as established by provider Provided education to patient re: stroke prevention, s/s of heart attack and stroke Reviewed medications with patient and discussed importance of compliance Counseled on adverse effects of illicit drug and excessive alcohol use in patients with high blood pressure  Counseled on the importance of exercise goals with target of 150 minutes per week Discussed plans with patient for ongoing care management follow up and provided patient with direct contact information for care management team Advised patient, providing education and rationale, to monitor blood pressure daily and record, calling PCP for findings outside established parameters Reviewed scheduled/upcoming provider appointments including:  Advised patient to discuss any new changes with provider Provided education on prescribed diet Renal diet Discussed complications of poorly controlled blood pressure such as heart disease, stroke, circulatory complications, vision complications, kidney impairment, sexual dysfunction Screening for signs and symptoms of depression related to chronic disease state  Assessed social determinant of health barriers  Patient Goals/Self-Care Activities: Take all medications as prescribed Attend all scheduled provider appointments Call pharmacy for medication refills 3-7 days in advance of running out of medications Attend church or other social activities Perform IADL's (shopping, preparing meals, housekeeping, managing finances) independently Call provider office for new concerns or questions  Work with the social worker to address care coordination needs and will continue to work with the clinical team to address health care and disease management related needs check blood pressure daily keep all doctor appointments  Follow Up Plan:  Telephone follow up  appointment with care management team member scheduled for:  02-24-2023 at 2:30 pm             Our next appointment is by telephone on 02-24-2023 at 2:30 pm  Please call the care guide team at [phone] if you need to cancel or reschedule your appointment.   If you are experiencing a Mental Health or Behavioral Health Crisis or need someone to talk to, please call the Suicide and Crisis Lifeline: 988 call the [place] National Suicide Prevention Lifeline: 502-085-1742 or TTY: 4432659471 TTY 346-628-1097) to talk to a trained counselor call 1-800-273-TALK (toll free, 24 hour hotline) go to [facility] [address], [place] [phone])   Patient verbalizes understanding of instructions and care plan provided today and agrees to view in MyChart. Active MyChart status and patient understanding of how to access instructions and care plan via MyChart confirmed with patient.     Telephone follow up appointment with care management team member scheduled for:02-24-2023 at 2:30 pm  Danise Edge, BSN RN RN Care Manager  [facility]  Ambulatory Care Management  Direct Number: [phone]

## 2023-01-20 NOTE — Patient Outreach (Signed)
  Care Coordination   Follow Up Visit Note   01/20/2023 Name: James Wall MRN: [id number] DOB: Mar 02, 1965  James Wall is a 57 y.o. year old male who sees Shelby Mattocks, DO for primary care. I spoke with  [name] by phone today.  What matters to the patients health and wellness today?  Completed follow up call with James Wall. James Wall reports he will move into [facility] on [address] in [place] due to have request to move out of current home. Pt was provided apartment resources on 01/07/2023 and advised by property mgmt of 1-2 year waiting list.     Goals Addressed   None     SDOH assessments and interventions completed:  Yes     Care Coordination Interventions:  Yes, provided    Follow up plan: Follow up call scheduled for 02/06/2023    Encounter Outcome:  Patient Visit Completed   Gwyndolyn Saxon MSW, LCSW Licensed Clinical Social Worker  [facility], Population Health Direct Dial: [phone]  Fax: [phone]

## 2023-01-20 NOTE — Patient Outreach (Addendum)
Care Management   Visit Note  01/20/2023 Name: James Wall MRN: [id number] DOB: Nov 02, 1965  Subjective: James Wall is a 57 y.o. year old male who is a primary care patient of James Mattocks, DO. The Care Management team was consulted for assistance.      Engaged with patient spoke with patient by telephone.   Goals Addressed             This Visit's Progress    RNCM Care Management Expected Outcomes: Monitor, Self-Manage and Reduce Symptoms of: HTN       Current Barriers:  Care Coordination needs related to Housing barriers Chronic Disease Management support and education needs related to HTN   RNCM Clinical Goal(s):  Patient will verbalize basic understanding of  HTN disease process and self health management plan as evidenced by verbal explanation, recognizing symptoms, lifestyle modifications take all medications exactly as prescribed and will call provider for medication related questions as evidenced by compliance with all medications attend all scheduled medical appointments: with primary care provider and specialist as evidenced by keeping all scheduled appointments demonstrate Improved and Ongoing adherence to prescribed treatment plan for HTN as evidenced by consistent medication compliance, symptom monitoring, continued lifestyle modifications continue to work with RN Care Manager to address care management and care coordination needs related to  HTN as evidenced by adherence to CM Team Scheduled appointments through collaboration with RN Care manager, provider, and care team.   Interventions: Evaluation of current treatment plan related to  self management and patient's adherence to plan as established by provider   Hypertension Interventions:  (Status:  New goal. and Goal on track:  Yes.) Long Term Goal Last practice recorded BP readings:  BP Readings from Last 3 Encounters:  01/13/23 118/78  12/23/22 (!) 191/105  11/06/22 100/63   Most recent  eGFR/CrCl:  Lab Results  Component Value Date   EGFR 4 (L) 11/06/2022    No components found for: "CRCL"  Evaluation of current treatment plan related to hypertension self management and patient's adherence to plan as established by provider Provided education to patient re: stroke prevention, s/s of heart attack and stroke Reviewed medications with patient and discussed importance of compliance Counseled on adverse effects of illicit drug and excessive alcohol use in patients with high blood pressure  Counseled on the importance of exercise goals with target of 150 minutes per week Discussed plans with patient for ongoing care management follow up and provided patient with direct contact information for care management team Advised patient, providing education and rationale, to monitor blood pressure daily and record, calling PCP for findings outside established parameters Reviewed scheduled/upcoming provider appointments including:  Advised patient to discuss any new changes with provider Provided education on prescribed diet Renal diet Discussed complications of poorly controlled blood pressure such as heart disease, stroke, circulatory complications, vision complications, kidney impairment, sexual dysfunction Screening for signs and symptoms of depression related to chronic disease state  Assessed social determinant of health barriers  Patient Goals/Self-Care Activities: Take all medications as prescribed Attend all scheduled provider appointments Call pharmacy for medication refills 3-7 days in advance of running out of medications Attend church or other social activities Perform IADL's (shopping, preparing meals, housekeeping, managing finances) independently Call provider office for new concerns or questions  Work with the social worker to address care coordination needs and will continue to work with the clinical team to address health care and disease management related  needs check blood pressure daily keep all  doctor appointments  Follow Up Plan:  Telephone follow up appointment with care management team member scheduled for:  02-24-2023 at 2:30 pm               Consent to Services:  Patient was given information about care management services, agreed to services, and gave verbal consent to participate.   Plan: Telephone follow up appointment with care management team member scheduled for:02-24-2023 at 2:30 pm  Danise Edge, BSN RN RN Care Manager  [facility]  Ambulatory Care Management  Direct Number: [phone]

## 2023-01-21 DIAGNOSIS — Z992 Dependence on renal dialysis: Secondary | ICD-10-CM | POA: Diagnosis not present

## 2023-01-21 DIAGNOSIS — N186 End stage renal disease: Secondary | ICD-10-CM | POA: Diagnosis not present

## 2023-01-21 DIAGNOSIS — N2581 Secondary hyperparathyroidism of renal origin: Secondary | ICD-10-CM | POA: Diagnosis not present

## 2023-01-21 DIAGNOSIS — D689 Coagulation defect, unspecified: Secondary | ICD-10-CM | POA: Diagnosis not present

## 2023-01-21 DIAGNOSIS — D509 Iron deficiency anemia, unspecified: Secondary | ICD-10-CM | POA: Diagnosis not present

## 2023-01-21 DIAGNOSIS — D631 Anemia in chronic kidney disease: Secondary | ICD-10-CM | POA: Diagnosis not present

## 2023-01-21 DIAGNOSIS — T8249XA Other complication of vascular dialysis catheter, initial encounter: Secondary | ICD-10-CM | POA: Diagnosis not present

## 2023-01-23 DIAGNOSIS — Z992 Dependence on renal dialysis: Secondary | ICD-10-CM | POA: Diagnosis not present

## 2023-01-23 DIAGNOSIS — T8249XA Other complication of vascular dialysis catheter, initial encounter: Secondary | ICD-10-CM | POA: Diagnosis not present

## 2023-01-23 DIAGNOSIS — N2581 Secondary hyperparathyroidism of renal origin: Secondary | ICD-10-CM | POA: Diagnosis not present

## 2023-01-23 DIAGNOSIS — D631 Anemia in chronic kidney disease: Secondary | ICD-10-CM | POA: Diagnosis not present

## 2023-01-23 DIAGNOSIS — D689 Coagulation defect, unspecified: Secondary | ICD-10-CM | POA: Diagnosis not present

## 2023-01-23 DIAGNOSIS — N186 End stage renal disease: Secondary | ICD-10-CM | POA: Diagnosis not present

## 2023-01-23 DIAGNOSIS — D509 Iron deficiency anemia, unspecified: Secondary | ICD-10-CM | POA: Diagnosis not present

## 2023-01-27 DIAGNOSIS — D509 Iron deficiency anemia, unspecified: Secondary | ICD-10-CM | POA: Diagnosis not present

## 2023-01-27 DIAGNOSIS — D689 Coagulation defect, unspecified: Secondary | ICD-10-CM | POA: Diagnosis not present

## 2023-01-27 DIAGNOSIS — N2581 Secondary hyperparathyroidism of renal origin: Secondary | ICD-10-CM | POA: Diagnosis not present

## 2023-01-27 DIAGNOSIS — T8249XA Other complication of vascular dialysis catheter, initial encounter: Secondary | ICD-10-CM | POA: Diagnosis not present

## 2023-01-27 DIAGNOSIS — D631 Anemia in chronic kidney disease: Secondary | ICD-10-CM | POA: Diagnosis not present

## 2023-01-27 DIAGNOSIS — N186 End stage renal disease: Secondary | ICD-10-CM | POA: Diagnosis not present

## 2023-01-27 DIAGNOSIS — Z992 Dependence on renal dialysis: Secondary | ICD-10-CM | POA: Diagnosis not present

## 2023-01-27 NOTE — Patient Instructions (Signed)
Visit Information  Thank you for taking time to visit with me today. Please don't hesitate to contact me if I can be of assistance to you.   Following are the goals we discussed today:   Goals Addressed   None     Our next appointment is by telephone on 02/06/2023 at 10am  Please call the care guide team at [phone] if you need to cancel or reschedule your appointment.   If you are experiencing a Mental Health or Behavioral Health Crisis or need someone to talk to, please call 911   Patient verbalizes understanding of instructions and care plan provided today and agrees to view in MyChart. Active MyChart status and patient understanding of how to access instructions and care plan via MyChart confirmed with patient.     Telephone follow up appointment with care management team member scheduled for:02/06/23  10am  Gwyndolyn Saxon MSW, LCSW Licensed Clinical Social Worker  [facility], Population Health Direct Dial: [phone]  Fax: [phone]

## 2023-01-30 DIAGNOSIS — T8249XA Other complication of vascular dialysis catheter, initial encounter: Secondary | ICD-10-CM | POA: Diagnosis not present

## 2023-01-30 DIAGNOSIS — D689 Coagulation defect, unspecified: Secondary | ICD-10-CM | POA: Diagnosis not present

## 2023-01-30 DIAGNOSIS — Z992 Dependence on renal dialysis: Secondary | ICD-10-CM | POA: Diagnosis not present

## 2023-01-30 DIAGNOSIS — N186 End stage renal disease: Secondary | ICD-10-CM | POA: Diagnosis not present

## 2023-01-30 DIAGNOSIS — D631 Anemia in chronic kidney disease: Secondary | ICD-10-CM | POA: Diagnosis not present

## 2023-01-30 DIAGNOSIS — N2581 Secondary hyperparathyroidism of renal origin: Secondary | ICD-10-CM | POA: Diagnosis not present

## 2023-01-30 DIAGNOSIS — D509 Iron deficiency anemia, unspecified: Secondary | ICD-10-CM | POA: Diagnosis not present

## 2023-02-03 DIAGNOSIS — D509 Iron deficiency anemia, unspecified: Secondary | ICD-10-CM | POA: Diagnosis not present

## 2023-02-03 DIAGNOSIS — N2581 Secondary hyperparathyroidism of renal origin: Secondary | ICD-10-CM | POA: Diagnosis not present

## 2023-02-03 DIAGNOSIS — T8249XA Other complication of vascular dialysis catheter, initial encounter: Secondary | ICD-10-CM | POA: Diagnosis not present

## 2023-02-03 DIAGNOSIS — D631 Anemia in chronic kidney disease: Secondary | ICD-10-CM | POA: Diagnosis not present

## 2023-02-03 DIAGNOSIS — Z992 Dependence on renal dialysis: Secondary | ICD-10-CM | POA: Diagnosis not present

## 2023-02-03 DIAGNOSIS — I12 Hypertensive chronic kidney disease with 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: Secondary | ICD-10-CM | POA: Diagnosis not present

## 2023-02-03 DIAGNOSIS — D689 Coagulation defect, unspecified: Secondary | ICD-10-CM | POA: Diagnosis not present

## 2023-02-03 DIAGNOSIS — N186 End stage renal disease: Secondary | ICD-10-CM | POA: Diagnosis not present

## 2023-02-06 ENCOUNTER — Ambulatory Visit: Payer: Self-pay | Admitting: Licensed Clinical Social Worker

## 2023-02-06 DIAGNOSIS — D509 Iron deficiency anemia, unspecified: Secondary | ICD-10-CM | POA: Diagnosis not present

## 2023-02-06 DIAGNOSIS — R197 Diarrhea, unspecified: Secondary | ICD-10-CM | POA: Diagnosis not present

## 2023-02-06 DIAGNOSIS — Z992 Dependence on renal dialysis: Secondary | ICD-10-CM | POA: Diagnosis not present

## 2023-02-06 DIAGNOSIS — N2581 Secondary hyperparathyroidism of renal origin: Secondary | ICD-10-CM | POA: Diagnosis not present

## 2023-02-06 DIAGNOSIS — Z23 Encounter for immunization: Secondary | ICD-10-CM | POA: Diagnosis not present

## 2023-02-06 DIAGNOSIS — T8249XA Other complication of vascular dialysis catheter, initial encounter: Secondary | ICD-10-CM | POA: Diagnosis not present

## 2023-02-06 DIAGNOSIS — D689 Coagulation defect, unspecified: Secondary | ICD-10-CM | POA: Diagnosis not present

## 2023-02-06 DIAGNOSIS — N186 End stage renal disease: Secondary | ICD-10-CM | POA: Diagnosis not present

## 2023-02-06 DIAGNOSIS — D631 Anemia in chronic kidney disease: Secondary | ICD-10-CM | POA: Diagnosis not present

## 2023-02-06 NOTE — Patient Instructions (Signed)
 Visit Information  Thank you for taking time to visit with me today. Please don't hesitate to contact me if I can be of assistance to you.   Following are the goals we discussed today:   Goals Addressed             This Visit's Progress    Care Coordination       Activities and task to complete in order to accomplish goals.   Follow up with housing resources discussed ([facility], [facility] and [facility]) Complete SCAT application lcsw will complete You have decided not to move forward with counseling and would like to work with me on brief coping skills to assist you with managing grief and depressive symptoms  Start / continue relaxed breathing 3 times daily  Patient secured stable housing and will implement self care techniques to reduce stress and anxiety.         Our next appointment is by telephone on 02/27/2023 at 11am  Please call the care guide team at [phone] if you need to cancel or reschedule your appointment.   If you are experiencing a Mental Health or Behavioral Health Crisis or need someone to talk to, please call 911   Patient verbalizes understanding of instructions and care plan provided today and agrees to view in MyChart. Active MyChart status and patient understanding of how to access instructions and care plan via MyChart confirmed with patient.     Telephone follow up appointment with care management team member scheduled for:02/27/23  Alfonso Rummer MSW, LCSW Licensed Clinical Social Worker  [facility], Population Health Direct Dial: [phone]  Fax: [phone]

## 2023-02-06 NOTE — Patient Outreach (Signed)
  Care Coordination   Follow Up Visit Note   02/06/2023 Name: James Wall MRN: [id number] DOB: 1965/07/20  James Wall is a 58 y.o. year old male who sees James Pond, DO for primary care. I spoke with  James Wall by phone today.  What matters to the patients health and wellness today?  James Wall reports he secured stabled housing. James Wall will implement self care techniques to reduce stress and anxiety.     Goals Addressed             This Visit's Progress    Care Coordination       Activities and task to complete in order to accomplish goals.   Follow up with housing resources discussed (James Wall, James Wall and James Wall) Complete SCAT application lcsw will complete You have decided not to move forward with counseling and would like to work with me on brief coping skills to assist you with managing grief and depressive symptoms  Start / continue relaxed breathing 3 times daily  Patient secured stable housing and will implement self care techniques to reduce stress and anxiety.         SDOH assessments and interventions completed:  No  SDOH Interventions Today    Flowsheet Row Most Recent Value  SDOH Interventions   Food Insecurity Interventions Intervention Not Indicated  Housing Interventions Intervention Not Indicated  [James. Fussell reports he secured stable housing]  Utilities Interventions Intervention Not Indicated        Care Coordination Interventions:  Yes, provided  Interventions Today    Flowsheet Row Most Recent Value  Mental Health Interventions   Mental Health Discussed/Reviewed Mental Health Reviewed, Coping Strategies, Anxiety  [Discussed coping strategies such as self care, reducing stress and prioritizing rest to prevent burnout]       Follow up plan: Follow up call scheduled for 02/27/2023    Encounter Outcome:  Patient Visit Completed   Alfonso Rummer MSW, LCSW Licensed Clinical Social  Worker  [facility], Population Health Direct Dial: [phone]  Fax: [phone]

## 2023-02-09 DIAGNOSIS — T8249XA Other complication of vascular dialysis catheter, initial encounter: Secondary | ICD-10-CM | POA: Diagnosis not present

## 2023-02-09 DIAGNOSIS — D689 Coagulation defect, unspecified: Secondary | ICD-10-CM | POA: Diagnosis not present

## 2023-02-09 DIAGNOSIS — R197 Diarrhea, unspecified: Secondary | ICD-10-CM | POA: Diagnosis not present

## 2023-02-09 DIAGNOSIS — D509 Iron deficiency anemia, unspecified: Secondary | ICD-10-CM | POA: Diagnosis not present

## 2023-02-09 DIAGNOSIS — Z23 Encounter for immunization: Secondary | ICD-10-CM | POA: Diagnosis not present

## 2023-02-09 DIAGNOSIS — D631 Anemia in chronic kidney disease: Secondary | ICD-10-CM | POA: Diagnosis not present

## 2023-02-09 DIAGNOSIS — N186 End stage renal disease: Secondary | ICD-10-CM | POA: Diagnosis not present

## 2023-02-09 DIAGNOSIS — N2581 Secondary hyperparathyroidism of renal origin: Secondary | ICD-10-CM | POA: Diagnosis not present

## 2023-02-09 DIAGNOSIS — Z992 Dependence on renal dialysis: Secondary | ICD-10-CM | POA: Diagnosis not present

## 2023-02-10 ENCOUNTER — Encounter (HOSPITAL_COMMUNITY): Payer: Self-pay | Admitting: Emergency Medicine

## 2023-02-10 ENCOUNTER — Other Ambulatory Visit: Payer: Self-pay

## 2023-02-10 ENCOUNTER — Emergency Department (HOSPITAL_COMMUNITY)
Admission: EM | Admit: 2023-02-10 | Discharge: 2023-02-11 | Disposition: A | Discharge status: Home / Self Care | Payer: 59 | Attending: Emergency Medicine | Admitting: Emergency Medicine

## 2023-02-10 DIAGNOSIS — Z992 Dependence on renal dialysis: Secondary | ICD-10-CM | POA: Diagnosis not present

## 2023-02-10 DIAGNOSIS — R112 Nausea with vomiting, unspecified: Secondary | ICD-10-CM | POA: Diagnosis not present

## 2023-02-10 DIAGNOSIS — I12 Hypertensive chronic kidney disease with 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: Secondary | ICD-10-CM | POA: Insufficient documentation

## 2023-02-10 DIAGNOSIS — R064 Hyperventilation: Secondary | ICD-10-CM | POA: Diagnosis not present

## 2023-02-10 DIAGNOSIS — Z79899 Other long term (current) drug therapy: Secondary | ICD-10-CM | POA: Diagnosis not present

## 2023-02-10 DIAGNOSIS — I10 Essential (primary) hypertension: Secondary | ICD-10-CM | POA: Diagnosis not present

## 2023-02-10 DIAGNOSIS — R110 Nausea: Secondary | ICD-10-CM

## 2023-02-10 DIAGNOSIS — N186 End stage renal disease: Secondary | ICD-10-CM | POA: Insufficient documentation

## 2023-02-10 LAB — COMPREHENSIVE METABOLIC PANEL
ALT: 9 U/L (ref 0–44)
AST: 18 U/L (ref 15–41)
Albumin: 4.1 g/dL (ref 3.5–5.0)
Alkaline Phosphatase: 102 U/L (ref 38–126)
Anion gap: 21 — ABNORMAL HIGH (ref 5–15)
BUN: 41 mg/dL — ABNORMAL HIGH (ref 6–20)
CO2: 25 mmol/L (ref 22–32)
Calcium: 9.9 mg/dL (ref 8.9–10.3)
Chloride: 99 mmol/L (ref 98–111)
Creatinine, Ser: 10.95 mg/dL — ABNORMAL HIGH (ref 0.61–1.24)
GFR, Estimated: 5 mL/min — ABNORMAL LOW (ref >60)
Glucose, Bld: 94 mg/dL (ref 70–99)
Potassium: 5.2 mmol/L — ABNORMAL HIGH (ref 3.5–5.1)
Sodium: 145 mmol/L (ref 135–145)
Total Bilirubin: 0.9 mg/dL (ref 0.0–1.2)
Total Protein: 8.4 g/dL — ABNORMAL HIGH (ref 6.5–8.1)

## 2023-02-10 LAB — CBC
HCT: 36.8 % — ABNORMAL LOW (ref 39.0–52.0)
Hemoglobin: 12 g/dL — ABNORMAL LOW (ref 13.0–17.0)
MCH: 33.2 pg (ref 26.0–34.0)
MCHC: 32.6 g/dL (ref 30.0–36.0)
MCV: 101.9 fL — ABNORMAL HIGH (ref 80.0–100.0)
Platelets: 178 10*3/uL (ref 150–400)
RBC: 3.61 MIL/uL — ABNORMAL LOW (ref 4.22–5.81)
RDW: 15 % (ref 11.5–15.5)
WBC: 5.7 10*3/uL (ref 4.0–10.5)
nRBC: 0 % (ref 0.0–0.2)

## 2023-02-10 LAB — LIPASE, BLOOD: Lipase: 39 U/L (ref 11–51)

## 2023-02-10 MED ORDER — ONDANSETRON 4 MG PO TBDP
4.0000 mg | ORAL_TABLET | Freq: Once | ORAL | Status: AC | PRN
  Administered 2023-02-10: 4 mg via ORAL
  Filled 2023-02-10: qty 1

## 2023-02-10 NOTE — ED Triage Notes (Signed)
 Pt BIB EMS from home for nausea and vomiting with generalized weakness since last night. Dialysis yesterday, completed all but 1 hour due to not feeling well. Pt states he has not been able to keep anything down since this morning. Pt states he does not produce urine.

## 2023-02-10 NOTE — ED Notes (Addendum)
 Pt refused vitals to be updated. Pt said cuff was to tight. Pt took cuff off while, vitals while in process.

## 2023-02-10 NOTE — ED Provider Triage Note (Signed)
 Emergency Medicine Provider Triage Evaluation Note  James Wall , a 58 y.o. male  was evaluated in triage.  Pt complains of nausea, vomiting and weakness since last night.  No abdominal pain or diarrhea.  Review of Systems  Positive: Negative:   Physical Exam  BP (!) 182/108 (BP Location: Left Arm)   Pulse 73   Temp 98.9 F (37.2 C) (Oral)   Resp 20   Ht 5' 5 (1.651 m)   Wt 94.5 kg   SpO2 100%   BMI 34.67 kg/m  Gen:   Awake, appears uncomfortable Resp:  Normal effort  MSK:   Moves extremities without difficulty  Other:  Neuro without gross deficit, PERRL  Medical Decision Making  Medically screening exam initiated at 6:24 PM.  Appropriate orders placed.  James Wall was informed that the remainder of the evaluation will be completed by another provider, this initial triage assessment does not replace that evaluation, and the importance of remaining in the ED until their evaluation is complete.     James Lynwood DEL, PA-C 02/21/23 1417

## 2023-02-11 MED ORDER — SODIUM ZIRCONIUM CYCLOSILICATE 10 G PO PACK
10.0000 g | PACK | Freq: Once | ORAL | Status: AC
  Administered 2023-02-11: 10 g via ORAL
  Filled 2023-02-11: qty 1

## 2023-02-11 MED ORDER — METOPROLOL TARTRATE 25 MG PO TABS
100.0000 mg | ORAL_TABLET | Freq: Once | ORAL | Status: AC
  Administered 2023-02-11: 100 mg via ORAL
  Filled 2023-02-11: qty 4

## 2023-02-11 MED ORDER — HYDRALAZINE HCL 25 MG PO TABS
50.0000 mg | ORAL_TABLET | Freq: Once | ORAL | Status: AC
  Administered 2023-02-11: 50 mg via ORAL
  Filled 2023-02-11: qty 2

## 2023-02-11 MED ORDER — ISOSORBIDE DINITRATE 10 MG PO TABS
20.0000 mg | ORAL_TABLET | Freq: Once | ORAL | Status: AC
  Administered 2023-02-11: 20 mg via ORAL
  Filled 2023-02-11: qty 2

## 2023-02-11 NOTE — ED Notes (Addendum)
 Pt refused vitals, explained that this is a part of care, Pt said "they do not care."

## 2023-02-11 NOTE — ED Notes (Signed)
 Patient refusing to come to room to be seen.  Security to escort patient off property.

## 2023-02-11 NOTE — ED Provider Notes (Signed)
 [place] EMERGENCY DEPARTMENT AT [facility] Provider Note   CSN: [id number] Arrival date & time: 02/10/23  1759     History  Chief Complaint  Patient presents with   Nausea    James Wall is a 58 y.o. male.  Patient is a 58 year old male with a past medical history of ESRD on MWF HD, hypertension, prior CVA, prostate cancer on radiation treatment presenting to the emergency department with nausea and vomiting.  Patient states that he had his dialysis session as scheduled on Monday.  He states he woke up Tuesday morning with nausea and vomiting and came to the emergency department.  He states that he has had no fevers, denies any abdominal pain or diarrhea states he had a normal bowel movement yesterday.  He denies any known sick contacts.  He has been in the waiting room overnight and reports that his symptoms have improved since being in the waiting room and has been able to drink water  without any further vomiting.  The history is provided by the patient.       Home Medications Prior to Admission medications   Medication Sig Start Date End Date Taking? Authorizing Provider  acetaminophen  (TYLENOL ) 650 MG CR tablet Take 650 mg by mouth every 8 (eight) hours as needed for pain. Pt takes 2 tablet as needed.    [provider]  amLODipine  (NORVASC ) 10 MG tablet Take 1 tablet (10 mg total) by mouth at bedtime. 12/05/21   Santo Stanly LABOR, MD  cloNIDine  (CATAPRES  - DOSED IN MG/24 HR) 0.3 mg/24hr patch Place 1 patch (0.3 mg total) onto the skin once a week on Sunday. 01/30/22   Chandrasekhar, Stanly LABOR, MD  hydrALAZINE  (APRESOLINE ) 50 MG tablet Take 1 tablet (50 mg total) by mouth 2 (two) times daily. 12/05/21   Chandrasekhar, Stanly LABOR, MD  hydrOXYzine  (ATARAX ) 25 MG tablet Take 25 mg by mouth daily.    [provider]  isosorbide  dinitrate (ISORDIL ) 20 MG tablet Take 1 tablet (20 mg total) by mouth 2 (two) times daily. 12/05/21   Santo Stanly LABOR, MD  latanoprost  (XALATAN ) 0.005 % ophthalmic solution Apply to eye. 04/14/22   [provider]  metoprolol  tartrate (LOPRESSOR ) 100 MG tablet Take 1 tablet (100 mg total) by mouth 2 (two) times daily. 12/05/21   Chandrasekhar, Stanly LABOR, MD  pantoprazole  (PROTONIX ) 20 MG tablet TAKE 1 TABLET(20 MG) BY MOUTH DAILY 01/09/22   [name], DO  rosuvastatin  (CRESTOR ) 10 MG tablet Take 1 tablet (10 mg total) by mouth daily. 12/05/21   Santo Stanly LABOR, MD  senna (SENOKOT) 8.6 MG TABS tablet Take 1 tablet (8.6 mg total) by mouth daily. 11/06/22   Everhart, Kirstie, DO  SENSIPAR  90 MG tablet Take 90 mg by mouth daily. 05/07/22   [provider]  sevelamer  carbonate (RENVELA ) 800 MG tablet Take 4 tablets (3,200 mg total) by mouth 3 (three) times daily with meals. 09/30/21   Angiulli, Toribio PARAS, PA-C  tamsulosin  (FLOMAX ) 0.4 MG CAPS capsule TAKE 2 CAPSULES(0.8 MG) BY MOUTH DAILY Patient taking differently: Take 0.4 mg by mouth daily. 04/10/22   [name], DO      Allergies    Venofer   [ferric oxide] and Aspirin     Review of Systems   Review of Systems  Physical Exam Updated Vital Signs BP (!) 137/92   Pulse (!) 48   Temp 99.1 F (37.3 C)   Resp 12   Ht 5' 5 (1.651 m)  Wt 94.5 kg   SpO2 97%   BMI 34.67 kg/m  Physical Exam Vitals and nursing note reviewed.  Constitutional:      General: He is not in acute distress.    Appearance: Normal appearance.  HENT:     Head: Normocephalic and atraumatic.     Nose: Nose normal.     Mouth/Throat:     Mouth: Mucous membranes are moist.     Pharynx: Oropharynx is clear.  Eyes:     Extraocular Movements: Extraocular movements intact.     Conjunctiva/sclera: Conjunctivae normal.  Cardiovascular:     Rate and Rhythm: Normal rate and regular rhythm.     Heart sounds: Normal heart sounds.  Pulmonary:     Effort: Pulmonary effort is normal.     Breath sounds: Normal breath sounds.  Abdominal:     General: Abdomen  is flat.     Palpations: Abdomen is soft.     Tenderness: There is no abdominal tenderness.  Musculoskeletal:        General: Normal range of motion.     Cervical back: Normal range of motion.  Skin:    General: Skin is warm and dry.  Neurological:     General: No focal deficit present.     Mental Status: He is alert and oriented to person, place, and time.  Psychiatric:        Mood and Affect: Mood normal.        Behavior: Behavior normal.     ED Results / Procedures / Treatments   Labs (all labs ordered are listed, but only abnormal results are displayed) Labs Reviewed  COMPREHENSIVE METABOLIC PANEL - Abnormal; Notable for the following components:      Result Value   Potassium 5.2 (*)    BUN 41 (*)    Creatinine, Ser 10.95 (*)    Total Protein 8.4 (*)    GFR, Estimated 5 (*)    Anion gap 21 (*)    All other components within normal limits  CBC - Abnormal; Notable for the following components:   RBC 3.61 (*)    Hemoglobin 12.0 (*)    HCT 36.8 (*)    MCV 101.9 (*)    All other components within normal limits  LIPASE, BLOOD    EKG EKG Interpretation Date/Time:  Tuesday February 10 2023 18:10:27 EST Ventricular Rate:  69 PR Interval:  200 QRS Duration:  86 QT Interval:  406 QTC Calculation: 435 R Axis:   -48  Text Interpretation: Normal sinus rhythm Left anterior fascicular block T wave abnormality, consider inferior ischemia Abnormal ECG Confirmed by [name] ([id number]) on 02/11/2023 9:54:08 AM  Radiology No results found.  Procedures Procedures    Medications Ordered in ED Medications  ondansetron  (ZOFRAN -ODT) disintegrating tablet 4 mg (4 mg Oral Given 02/10/23 1808)  hydrALAZINE  (APRESOLINE ) tablet 50 mg (50 mg Oral Given 02/11/23 1214)  isosorbide  dinitrate (ISORDIL ) tablet 20 mg (20 mg Oral Given 02/11/23 1213)  metoprolol  tartrate (LOPRESSOR ) tablet 100 mg (100 mg Oral Given 02/11/23 1213)  sodium zirconium cyclosilicate  (LOKELMA ) packet 10 g (10 g Oral  Given 02/11/23 1316)    ED Course/ Medical Decision Making/ A&P Clinical Course as of 02/11/23 1433  Wed Feb 11, 2023  1228 I spoke with Dr. Geralynn who will talk to nephrology SW about facilitating outpatient HD tomorrow as we will unlikely be able to scheduled ED HD today without emergent need for HD. Recommended Lokelma  for hyperkalemia. [VK]  1430 Nephrology  SW unable to schedule and outpatient appt for tomorrow. Patient offered to stay in the ED for HD tonight but likely would not happen until later this evening. Patient does not want to wait and would prefer to call his HD center in the morning to see if he could get on the schedule. He is stable for discharge home and was given strict return precautions. [VK]    Clinical Course User Index [VK] Kingsley, Gabriel Paulding K, DO                                 Medical Decision Making This patient presents to the ED with chief complaint(s) of N/V with pertinent past medical history of ESRD, HTN, prior CVA, prostate cancer which further complicates the presenting complaint. The complaint involves an extensive differential diagnosis and also carries with it a high risk of complications and morbidity.    The differential diagnosis includes dehydration, electrolyte abnormality, atypical ACS, arrhythmia, pancreatitis, hepatitis, no point abdominal tenderness making intra-abdominal infection unlikely, viral syndrome  Additional history obtained: Additional history obtained from N/A Records reviewed Primary Care Documents and outpatient cardiology records  ED Course and Reassessment: On patient's arrival he is hypertensive but otherwise hemodynamically stable in no acute distress.  Was initially evaluated in triage and given Zofran  and had EKG and labs performed.  Labs showed mild hyperkalemia and elevated anion gap which may be due to slight dehydration or from his missed dialysis.  EKG shows no hyperkalemic changes.  Labs are otherwise within normal  range.  The patient's symptoms have essentially resolved and he has tolerated p.o. while in the waiting room.  He is due for HD today and will contact nephrology to see if we are able to facilitate HD in the emergency department or if he is safe for outpatient dialysis.  He will be given his home BP meds he has missed this morning.  Independent labs interpretation:  The following labs were independently interpreted: mild hyperkalemia, elevated AGAP may be due to missed HD and mild dehydration  Independent visualization of imaging: -N/A  Consultation: - Consulted or discussed management/test interpretation w/ external professional: nephrology  Consideration for admission or further workup: Patient has no emergent conditions requiring admission or further work-up at this time and is stable for discharge home with primary care follow-up  Social Determinants of health: N/A    Amount and/or Complexity of Data Reviewed Labs: ordered.  Risk Prescription drug management.          Final Clinical Impression(s) / ED Diagnoses Final diagnoses:  Nausea  Hypertension, unspecified type    Rx / DC Orders ED Discharge Orders     None         Kingsley, Amedeo Detweiler K, DO 02/11/23 1434

## 2023-02-11 NOTE — Discharge Instructions (Addendum)
 You were seen in the emergency department for your nausea and vomiting.  Your workup showed no abnormalities with your liver or pancreas and no severe dehydration.  Your potassium level was mildly elevated and we gave you Lokelma  in the emergency department.  You should call your dialysis center tomorrow morning to see if they can get you on the dialysis schedule.  You can continue to take your home nausea medicine as prescribed.  You can follow-up with your primary doctor to have your nausea rechecked.  You should return to the emergency department if you are having repetitive vomiting despite the nausea medicine, you develop severe abdominal pain, you develop shortness of breath or pass out or if you have any other new or concerning symptoms.

## 2023-02-11 NOTE — Progress Notes (Addendum)
 Contacted by nephrologist with request to see if clinic can treat pt tomorrow. Contacted [facility] and advised by staff that clinic does not have any appts available for tomorrow. Info provided to nephrologist and ED provider.   Randine Mungo Renal Navigator [phone]  Addendum at 3:09 pm: Received a call from [facility] to say that pt can receive a treatment tomorrow. Pt will need to arrive at 9:50 am for 10:10 am chair time. Contacted pt via phone and had to leave a voicemail with appt info. Update provided to ED provider, nephrologist, and pt's RN.

## 2023-02-11 NOTE — ED Notes (Signed)
Refused Vitals 

## 2023-02-12 ENCOUNTER — Telehealth: Payer: Self-pay

## 2023-02-12 DIAGNOSIS — N186 End stage renal disease: Secondary | ICD-10-CM | POA: Diagnosis not present

## 2023-02-12 DIAGNOSIS — D631 Anemia in chronic kidney disease: Secondary | ICD-10-CM | POA: Diagnosis not present

## 2023-02-12 DIAGNOSIS — D689 Coagulation defect, unspecified: Secondary | ICD-10-CM | POA: Diagnosis not present

## 2023-02-12 DIAGNOSIS — D509 Iron deficiency anemia, unspecified: Secondary | ICD-10-CM | POA: Diagnosis not present

## 2023-02-12 DIAGNOSIS — R197 Diarrhea, unspecified: Secondary | ICD-10-CM | POA: Diagnosis not present

## 2023-02-12 DIAGNOSIS — Z992 Dependence on renal dialysis: Secondary | ICD-10-CM | POA: Diagnosis not present

## 2023-02-12 DIAGNOSIS — Z23 Encounter for immunization: Secondary | ICD-10-CM | POA: Diagnosis not present

## 2023-02-12 DIAGNOSIS — T8249XA Other complication of vascular dialysis catheter, initial encounter: Secondary | ICD-10-CM | POA: Diagnosis not present

## 2023-02-12 DIAGNOSIS — N2581 Secondary hyperparathyroidism of renal origin: Secondary | ICD-10-CM | POA: Diagnosis not present

## 2023-02-12 NOTE — Transitions of Care (Post Inpatient/ED Visit) (Signed)
 02/12/2023  Name: James Wall MRN: [id number] DOB: 1965/02/27  Today's TOC FU Call Status: Today's TOC FU Call Status:: Successful TOC FU Call Completed TOC FU Call Complete Date: 02/12/23 Patient's Name and Date of Birth confirmed.  Transition Care Management Follow-up Telephone Call Date of Discharge: 02/11/23 Discharge Facility: [facility]) Type of Discharge: Emergency Department Reason for ED Visit: Other: (nausea) How have you been since you were released from the hospital?: Better Any questions or concerns?: No  Items Reviewed: Did you receive and understand the discharge instructions provided?: Yes Medications obtained,verified, and reconciled?: Yes (Medications Reviewed) Any new allergies since your discharge?: No Dietary orders reviewed?: Yes Do you have support at home?: No  Medications Reviewed Today: Medications Reviewed Today     Reviewed by Emmitt Pan, LPN (Licensed Practical Nurse) on 02/12/23 at 1332  Med List Status: <None>   Medication Order Taking? Sig Documenting Provider Last Dose Status Informant  acetaminophen  (TYLENOL ) 650 MG CR tablet [id number] No Take 650 mg by mouth every 8 (eight) hours as needed for pain. Pt takes 2 tablet as needed. [provider] Taking Active   amLODipine  (NORVASC ) 10 MG tablet [id number] No Take 1 tablet (10 mg total) by mouth at bedtime. Santo Stanly LABOR, MD Taking Active Self, Pharmacy Records  cloNIDine  (CATAPRES  - DOSED IN MG/24 HR) 0.3 mg/24hr patch [id number] No Place 1 patch (0.3 mg total) onto the skin once a week on Sunday. Santo Stanly LABOR, MD Taking Active Self, Pharmacy Records           Med Note [facility], DEVERE LABOR Repress Oct 19, 2022  8:21 PM)    hydrALAZINE  (APRESOLINE ) 50 MG tablet [id number] No Take 1 tablet (50 mg total) by mouth 2 (two) times daily. Santo Stanly LABOR, MD Taking Active Self, Pharmacy Records  hydrOXYzine  (ATARAX ) 25 MG tablet [id number] No Take  25 mg by mouth daily. [provider] Taking Active Self, Pharmacy Records  isosorbide  dinitrate (ISORDIL ) 20 MG tablet [id number] No Take 1 tablet (20 mg total) by mouth 2 (two) times daily. Santo Stanly LABOR, MD Taking Active Self, Pharmacy Records  latanoprost  (XALATAN ) 0.005 % ophthalmic solution [id number] No Apply to eye. [provider] Taking Active Self, Pharmacy Records  metoprolol  tartrate (LOPRESSOR ) 100 MG tablet [id number] No Take 1 tablet (100 mg total) by mouth 2 (two) times daily. Santo Stanly LABOR, MD Taking Active Self, Pharmacy Records  pantoprazole  (PROTONIX ) 20 MG tablet [id number] No TAKE 1 TABLET(20 MG) BY MOUTH DAILY Dahbura, Anton, DO Taking Active Self, Pharmacy Records  rosuvastatin  (CRESTOR ) 10 MG tablet [id number] No Take 1 tablet (10 mg total) by mouth daily. Santo Stanly LABOR, MD Taking Active Self, Pharmacy Records  senna (SENOKOT) 8.6 MG TABS tablet [id number] No Take 1 tablet (8.6 mg total) by mouth daily. Everhart, Kirstie, DO Taking Active   SENSIPAR  90 MG tablet [id number] No Take 90 mg by mouth daily. [provider] Taking Active Self, Pharmacy Records  sevelamer  carbonate (RENVELA ) 800 MG tablet [id number] No Take 4 tablets (3,200 mg total) by mouth 3 (three) times daily with meals. Pegge Toribio PARAS, PA-C Taking Active Self, Pharmacy Records  tamsulosin  (FLOMAX ) 0.4 MG CAPS capsule [id number] No TAKE 2 CAPSULES(0.8 MG) BY MOUTH DAILY  Patient taking differently: Take 0.4 mg by mouth daily.   [name], DO Taking Active Self, Pharmacy Records  Med List Note Mylo Powell LITTIE Bishop 03/08/17 1642): Dialysis Monday, Wednesday, Friday - [address] [phone]  Home Care and Equipment/Supplies: Were Home Health Services Ordered?: NA Any new equipment or medical supplies ordered?: NA  Functional Questionnaire: Do you need assistance with bathing/showering or dressing?: No Do you need assistance  with meal preparation?: No Do you need assistance with eating?: No Do you have difficulty maintaining continence: No Do you need assistance with getting out of bed/getting out of a chair/moving?: No Do you have difficulty managing or taking your medications?: No  Follow up appointments reviewed: PCP Follow-up appointment confirmed?: Yes Date of PCP follow-up appointment?: 02/19/23 Follow-up Provider: [facility] Follow-up appointment confirmed?: NA Do you need transportation to your follow-up appointment?: No Do you understand care options if your condition(s) worsen?: Yes-patient verbalized understanding    SIGNATURE Julian Lemmings, LPN [facility] Nurse Health Advisor Direct Dial [phone]

## 2023-02-16 DIAGNOSIS — Z23 Encounter for immunization: Secondary | ICD-10-CM | POA: Diagnosis not present

## 2023-02-16 DIAGNOSIS — D689 Coagulation defect, unspecified: Secondary | ICD-10-CM | POA: Diagnosis not present

## 2023-02-16 DIAGNOSIS — N2581 Secondary hyperparathyroidism of renal origin: Secondary | ICD-10-CM | POA: Diagnosis not present

## 2023-02-16 DIAGNOSIS — D631 Anemia in chronic kidney disease: Secondary | ICD-10-CM | POA: Diagnosis not present

## 2023-02-16 DIAGNOSIS — Z992 Dependence on renal dialysis: Secondary | ICD-10-CM | POA: Diagnosis not present

## 2023-02-16 DIAGNOSIS — D509 Iron deficiency anemia, unspecified: Secondary | ICD-10-CM | POA: Diagnosis not present

## 2023-02-16 DIAGNOSIS — N186 End stage renal disease: Secondary | ICD-10-CM | POA: Diagnosis not present

## 2023-02-16 DIAGNOSIS — R197 Diarrhea, unspecified: Secondary | ICD-10-CM | POA: Diagnosis not present

## 2023-02-16 DIAGNOSIS — T8249XA Other complication of vascular dialysis catheter, initial encounter: Secondary | ICD-10-CM | POA: Diagnosis not present

## 2023-02-18 DIAGNOSIS — N186 End stage renal disease: Secondary | ICD-10-CM | POA: Diagnosis not present

## 2023-02-18 DIAGNOSIS — D689 Coagulation defect, unspecified: Secondary | ICD-10-CM | POA: Diagnosis not present

## 2023-02-18 DIAGNOSIS — T8249XA Other complication of vascular dialysis catheter, initial encounter: Secondary | ICD-10-CM | POA: Diagnosis not present

## 2023-02-18 DIAGNOSIS — Z23 Encounter for immunization: Secondary | ICD-10-CM | POA: Diagnosis not present

## 2023-02-18 DIAGNOSIS — D509 Iron deficiency anemia, unspecified: Secondary | ICD-10-CM | POA: Diagnosis not present

## 2023-02-18 DIAGNOSIS — N2581 Secondary hyperparathyroidism of renal origin: Secondary | ICD-10-CM | POA: Diagnosis not present

## 2023-02-18 DIAGNOSIS — R197 Diarrhea, unspecified: Secondary | ICD-10-CM | POA: Diagnosis not present

## 2023-02-18 DIAGNOSIS — D631 Anemia in chronic kidney disease: Secondary | ICD-10-CM | POA: Diagnosis not present

## 2023-02-18 DIAGNOSIS — Z992 Dependence on renal dialysis: Secondary | ICD-10-CM | POA: Diagnosis not present

## 2023-02-19 ENCOUNTER — Inpatient Hospital Stay: Payer: 59 | Admitting: Student

## 2023-02-20 DIAGNOSIS — Z23 Encounter for immunization: Secondary | ICD-10-CM | POA: Diagnosis not present

## 2023-02-20 DIAGNOSIS — D689 Coagulation defect, unspecified: Secondary | ICD-10-CM | POA: Diagnosis not present

## 2023-02-20 DIAGNOSIS — N2581 Secondary hyperparathyroidism of renal origin: Secondary | ICD-10-CM | POA: Diagnosis not present

## 2023-02-20 DIAGNOSIS — R197 Diarrhea, unspecified: Secondary | ICD-10-CM | POA: Diagnosis not present

## 2023-02-20 DIAGNOSIS — D509 Iron deficiency anemia, unspecified: Secondary | ICD-10-CM | POA: Diagnosis not present

## 2023-02-20 DIAGNOSIS — Z992 Dependence on renal dialysis: Secondary | ICD-10-CM | POA: Diagnosis not present

## 2023-02-20 DIAGNOSIS — N186 End stage renal disease: Secondary | ICD-10-CM | POA: Diagnosis not present

## 2023-02-20 DIAGNOSIS — T8249XA Other complication of vascular dialysis catheter, initial encounter: Secondary | ICD-10-CM | POA: Diagnosis not present

## 2023-02-20 DIAGNOSIS — D631 Anemia in chronic kidney disease: Secondary | ICD-10-CM | POA: Diagnosis not present

## 2023-02-23 DIAGNOSIS — D631 Anemia in chronic kidney disease: Secondary | ICD-10-CM | POA: Diagnosis not present

## 2023-02-23 DIAGNOSIS — T8249XA Other complication of vascular dialysis catheter, initial encounter: Secondary | ICD-10-CM | POA: Diagnosis not present

## 2023-02-23 DIAGNOSIS — D509 Iron deficiency anemia, unspecified: Secondary | ICD-10-CM | POA: Diagnosis not present

## 2023-02-23 DIAGNOSIS — D689 Coagulation defect, unspecified: Secondary | ICD-10-CM | POA: Diagnosis not present

## 2023-02-23 DIAGNOSIS — Z23 Encounter for immunization: Secondary | ICD-10-CM | POA: Diagnosis not present

## 2023-02-23 DIAGNOSIS — R197 Diarrhea, unspecified: Secondary | ICD-10-CM | POA: Diagnosis not present

## 2023-02-23 DIAGNOSIS — Z992 Dependence on renal dialysis: Secondary | ICD-10-CM | POA: Diagnosis not present

## 2023-02-23 DIAGNOSIS — N186 End stage renal disease: Secondary | ICD-10-CM | POA: Diagnosis not present

## 2023-02-23 DIAGNOSIS — N2581 Secondary hyperparathyroidism of renal origin: Secondary | ICD-10-CM | POA: Diagnosis not present

## 2023-02-24 ENCOUNTER — Other Ambulatory Visit: Payer: Self-pay | Admitting: *Deleted

## 2023-02-24 NOTE — Patient Outreach (Signed)
Care Management   Visit Note  02/24/2023 Name: James Wall MRN: [id number] DOB: [birth year]  Subjective: James Wall is a 58 y.o. year old male who is a primary care patient of Shelby Mattocks, DO. The Care Management team was consulted for assistance.      Engaged with patient spoke with patient by telephone.    Goals Addressed             This Visit's Progress    RNCM Care Management Expected Outcomes: Monitor, Self-Manage and Reduce Symptoms of: HTN       Current Barriers:  Care Coordination needs related to Housing barriers Chronic Disease Management support and education needs related to HTN   RNCM Clinical Goal(s):  Patient will verbalize basic understanding of  HTN disease process and self health management plan as evidenced by verbal explanation, recognizing symptoms, lifestyle modifications take all medications exactly as prescribed and will call provider for medication related questions as evidenced by compliance with all medications attend all scheduled medical appointments: with primary care provider and specialist as evidenced by keeping all scheduled appointments demonstrate Improved and Ongoing adherence to prescribed treatment plan for HTN as evidenced by consistent medication compliance, symptom monitoring, continued lifestyle modifications continue to work with RN Care Manager to address care management and care coordination needs related to  HTN as evidenced by adherence to CM Team Scheduled appointments through collaboration with RN Care manager, provider, and care team.   Interventions: Evaluation of current treatment plan related to  self management and patient's adherence to plan as established by provider   Hypertension Interventions:  (Status:  Goal on track:  Yes.) Long Term Goal Last practice recorded BP readings:  BP Readings from Last 3 Encounters:  02/11/23 (!) 137/92  01/13/23 118/78  12/23/22 (!) 191/105   Most recent eGFR/CrCl:   Lab Results  Component Value Date   EGFR 4 (L) 11/06/2022    No components found for: "CRCL"  Evaluation of current treatment plan related to hypertension self management and patient's adherence to plan as established by provider. Does not check BP at home. States his BP is checked/monitored 3x/week at HD. Reports no issues with his BP during HD. Denies any chest pain, dizziness at this time. He does report mild swelling in his lower legs but states he wears a compression hose and that does help Provided education to patient re: stroke prevention, s/s of heart attack and stroke Reviewed medications with patient and discussed importance of compliance. Reports compliance with all medications Counseled on adverse effects of illicit drug and excessive alcohol use in patients with high blood pressure  Counseled on the importance of exercise goals with target of 150 minutes per week Discussed plans with patient for ongoing care management follow up and provided patient with direct contact information for care management team Advised patient, providing education and rationale, to monitor blood pressure daily and record, calling PCP for findings outside established parameters Reviewed scheduled/upcoming provider appointments including:  Advised patient to discuss any new changes with provider Provided education on prescribed diet Renal diet Discussed complications of poorly controlled blood pressure such as heart disease, stroke, circulatory complications, vision complications, kidney impairment, sexual dysfunction Screening for signs and symptoms of depression related to chronic disease state  Assessed social determinant of health barriers  Patient Goals/Self-Care Activities: Take all medications as prescribed Attend all scheduled provider appointments Call pharmacy for medication refills 3-7 days in advance of running out of medications Attend church or other social  activities Perform IADL's  (shopping, preparing meals, housekeeping, managing finances) independently Call provider office for new concerns or questions  Work with the social worker to address care coordination needs and will continue to work with the clinical team to address health care and disease management related needs check blood pressure daily keep all doctor appointments  Follow Up Plan:  Telephone follow up appointment with care management team member scheduled for:  03-26-2023 at 9:45 am             Consent to Services:  Patient was given information about care management services, agreed to services, and gave verbal consent to participate.   Plan: Telephone follow up appointment with care management team member scheduled for:03-26-2023 at 9:45 am  Rosalene Billings, BSN RN [facility] RN Care Manager Direct Dial: [phone]  Fax: [phone]

## 2023-02-24 NOTE — Patient Instructions (Signed)
Visit Information  Thank you for taking time to visit with me today. Please don't hesitate to contact me if I can be of assistance to you before our next scheduled telephone appointment.  Following are the goals we discussed today:   Goals Addressed             This Visit's Progress    RNCM Care Management Expected Outcomes: Monitor, Self-Manage and Reduce Symptoms of: HTN       Current Barriers:  Care Coordination needs related to Housing barriers Chronic Disease Management support and education needs related to HTN   RNCM Clinical Goal(s):  Patient will verbalize basic understanding of  HTN disease process and self health management plan as evidenced by verbal explanation, recognizing symptoms, lifestyle modifications take all medications exactly as prescribed and will call provider for medication related questions as evidenced by compliance with all medications attend all scheduled medical appointments: with primary care provider and specialist as evidenced by keeping all scheduled appointments demonstrate Improved and Ongoing adherence to prescribed treatment plan for HTN as evidenced by consistent medication compliance, symptom monitoring, continued lifestyle modifications continue to work with RN Care Manager to address care management and care coordination needs related to  HTN as evidenced by adherence to CM Team Scheduled appointments through collaboration with RN Care manager, provider, and care team.   Interventions: Evaluation of current treatment plan related to  self management and patient's adherence to plan as established by provider   Hypertension Interventions:  (Status:  Goal on track:  Yes.) Long Term Goal Last practice recorded BP readings:  BP Readings from Last 3 Encounters:  02/11/23 (!) 137/92  01/13/23 118/78  12/23/22 (!) 191/105   Most recent eGFR/CrCl:  Lab Results  Component Value Date   EGFR 4 (L) 11/06/2022    No components found for:  "CRCL"  Evaluation of current treatment plan related to hypertension self management and patient's adherence to plan as established by provider. Does not check BP at home. States his BP is checked/monitored 3x/week at HD. Reports no issues with his BP during HD. Denies any chest pain, dizziness at this time. He does report mild swelling in his lower legs but states he wears a compression hose and that does help Provided education to patient re: stroke prevention, s/s of heart attack and stroke Reviewed medications with patient and discussed importance of compliance. Reports compliance with all medications Counseled on adverse effects of illicit drug and excessive alcohol use in patients with high blood pressure  Counseled on the importance of exercise goals with target of 150 minutes per week Discussed plans with patient for ongoing care management follow up and provided patient with direct contact information for care management team Advised patient, providing education and rationale, to monitor blood pressure daily and record, calling PCP for findings outside established parameters Reviewed scheduled/upcoming provider appointments including:  Advised patient to discuss any new changes with provider Provided education on prescribed diet Renal diet Discussed complications of poorly controlled blood pressure such as heart disease, stroke, circulatory complications, vision complications, kidney impairment, sexual dysfunction Screening for signs and symptoms of depression related to chronic disease state  Assessed social determinant of health barriers  Patient Goals/Self-Care Activities: Take all medications as prescribed Attend all scheduled provider appointments Call pharmacy for medication refills 3-7 days in advance of running out of medications Attend church or other social activities Perform IADL's (shopping, preparing meals, housekeeping, managing finances) independently Call provider  office for new concerns or questions  Work with the Child psychotherapist to address care coordination needs and will continue to work with the clinical team to address health care and disease management related needs check blood pressure daily keep all doctor appointments  Follow Up Plan:  Telephone follow up appointment with care management team member scheduled for:  03-26-2023 at 9:45 am           Our next appointment is by telephone on 03-26-2023 at 9:45 am  Please call the care guide team at [phone] if you need to cancel or reschedule your appointment.   If you are experiencing a Mental Health or Behavioral Health Crisis or need someone to talk to, please call the Suicide and Crisis Lifeline: 988 call the [place] National Suicide Prevention Lifeline: 617-381-8998 or TTY: 951-587-7053 TTY 920-845-8417) to talk to a trained counselor call 1-800-273-TALK (toll free, 24 hour hotline) go to [facility] [address] [place] [phone])   Patient verbalizes understanding of instructions and care plan provided today and agrees to view in MyChart. Active MyChart status and patient understanding of how to access instructions and care plan via MyChart confirmed with patient.     Telephone follow up appointment with care management team member scheduled for:03-26-2023 at 9:45 am  Rosalene Billings, BSN RN [facility], [facility] RN Care Manager Direct Dial: [phone]  Fax: [phone]

## 2023-02-25 DIAGNOSIS — Z23 Encounter for immunization: Secondary | ICD-10-CM | POA: Diagnosis not present

## 2023-02-25 DIAGNOSIS — D509 Iron deficiency anemia, unspecified: Secondary | ICD-10-CM | POA: Diagnosis not present

## 2023-02-25 DIAGNOSIS — Z992 Dependence on renal dialysis: Secondary | ICD-10-CM | POA: Diagnosis not present

## 2023-02-25 DIAGNOSIS — N186 End stage renal disease: Secondary | ICD-10-CM | POA: Diagnosis not present

## 2023-02-25 DIAGNOSIS — T8249XA Other complication of vascular dialysis catheter, initial encounter: Secondary | ICD-10-CM | POA: Diagnosis not present

## 2023-02-25 DIAGNOSIS — R197 Diarrhea, unspecified: Secondary | ICD-10-CM | POA: Diagnosis not present

## 2023-02-25 DIAGNOSIS — N2581 Secondary hyperparathyroidism of renal origin: Secondary | ICD-10-CM | POA: Diagnosis not present

## 2023-02-25 DIAGNOSIS — D631 Anemia in chronic kidney disease: Secondary | ICD-10-CM | POA: Diagnosis not present

## 2023-02-25 DIAGNOSIS — D689 Coagulation defect, unspecified: Secondary | ICD-10-CM | POA: Diagnosis not present

## 2023-02-27 ENCOUNTER — Ambulatory Visit: Payer: Self-pay | Admitting: Licensed Clinical Social Worker

## 2023-02-27 DIAGNOSIS — N186 End stage renal disease: Secondary | ICD-10-CM | POA: Diagnosis not present

## 2023-02-27 DIAGNOSIS — Z23 Encounter for immunization: Secondary | ICD-10-CM | POA: Diagnosis not present

## 2023-02-27 DIAGNOSIS — D631 Anemia in chronic kidney disease: Secondary | ICD-10-CM | POA: Diagnosis not present

## 2023-02-27 DIAGNOSIS — D689 Coagulation defect, unspecified: Secondary | ICD-10-CM | POA: Diagnosis not present

## 2023-02-27 DIAGNOSIS — D509 Iron deficiency anemia, unspecified: Secondary | ICD-10-CM | POA: Diagnosis not present

## 2023-02-27 DIAGNOSIS — T8249XA Other complication of vascular dialysis catheter, initial encounter: Secondary | ICD-10-CM | POA: Diagnosis not present

## 2023-02-27 DIAGNOSIS — N2581 Secondary hyperparathyroidism of renal origin: Secondary | ICD-10-CM | POA: Diagnosis not present

## 2023-02-27 DIAGNOSIS — R197 Diarrhea, unspecified: Secondary | ICD-10-CM | POA: Diagnosis not present

## 2023-02-27 DIAGNOSIS — Z992 Dependence on renal dialysis: Secondary | ICD-10-CM | POA: Diagnosis not present

## 2023-02-27 NOTE — Patient Instructions (Signed)
Visit Information  Thank you for taking time to visit with me today. Please don't hesitate to contact me if I can be of assistance to you.   Following are the goals we discussed today:   Goals Addressed             This Visit's Progress    Care Coordination       Activities and task to complete in order to accomplish goals.   Follow up with housing resources discussed ([address], [place] [address] and [place]) Complete SCAT application lcsw will complete You have decided not to move forward with counseling and would like to work with me on brief coping skills to assist you with managing grief and depressive symptoms  Start / continue relaxed breathing 3 times daily  Patient secured stable housing and will implement self care techniques to reduce stress and anxiety.  Goals reviewed 02/27/23 Affordable application sent via [place] mail to address on file. Address verified by A. Felton Clinton        Our next appointment is by telephone on 03/20/2023 at 11am  Please call the care guide team at [phone] if you need to cancel or reschedule your appointment.   If you are experiencing a Mental Health or Behavioral Health Crisis or need someone to talk to, please call 911   Patient verbalizes understanding of instructions and care plan provided today and agrees to view in MyChart. Active MyChart status and patient understanding of how to access instructions and care plan via MyChart confirmed with patient.     Telephone follow up appointment with care management team member scheduled for:03/20/2023  Gwyndolyn Saxon MSW, LCSW Licensed Clinical Social Worker  [facility], Population Health Direct Dial: [phone]  Fax: [phone]

## 2023-02-27 NOTE — Patient Outreach (Signed)
  Care Coordination   Follow Up Visit Note   02/27/2023 Name: James Wall MRN: [id number] DOB: 12-15-1965  James Wall is a 58 y.o. year old male who sees James Mattocks, DO for primary care. I spoke with  James Wall by phone today.  What matters to the patients health and wellness today?  LCSW A. James Wall and Pt James Wall created smart goals to secured alternate affordable housing.     Goals Addressed             This Visit's Progress    Care Coordination       Activities and task to complete in order to accomplish goals.   Follow up with housing resources discussed ([address], [place] [facility] and [place]) Complete SCAT application lcsw will complete You have decided not to move forward with counseling and would like to work with me on brief coping skills to assist you with managing grief and depressive symptoms  Start / continue relaxed breathing 3 times daily  Patient secured stable housing and will implement self care techniques to reduce stress and anxiety.  Goals reviewed 02/27/23 Affordable application sent via [place] mail to address on file. Address verified by A. James Wall        SDOH assessments and interventions completed:  No Assessment completed 02/24/2023    Care Coordination Interventions:  Yes, provided  Interventions Today    Flowsheet Row Most Recent Value  General Interventions   General Interventions Discussed/Reviewed Community Resources  [Sent pt affordable housing application]  Mental Health Interventions   Mental Health Discussed/Reviewed Mental Health Reviewed  [James Wall reports improvement in mood and family support has increased.]       Follow up plan: Follow up call scheduled for 03/20/2023    Encounter Outcome:  Patient Visit Completed  Gwyndolyn Saxon MSW, LCSW Licensed Clinical Social Worker  [facility], Population Health Direct Dial: [phone]  Fax: [phone]

## 2023-03-02 DIAGNOSIS — Z992 Dependence on renal dialysis: Secondary | ICD-10-CM | POA: Diagnosis not present

## 2023-03-02 DIAGNOSIS — R197 Diarrhea, unspecified: Secondary | ICD-10-CM | POA: Diagnosis not present

## 2023-03-02 DIAGNOSIS — D631 Anemia in chronic kidney disease: Secondary | ICD-10-CM | POA: Diagnosis not present

## 2023-03-02 DIAGNOSIS — N2581 Secondary hyperparathyroidism of renal origin: Secondary | ICD-10-CM | POA: Diagnosis not present

## 2023-03-02 DIAGNOSIS — D689 Coagulation defect, unspecified: Secondary | ICD-10-CM | POA: Diagnosis not present

## 2023-03-02 DIAGNOSIS — Z23 Encounter for immunization: Secondary | ICD-10-CM | POA: Diagnosis not present

## 2023-03-02 DIAGNOSIS — N186 End stage renal disease: Secondary | ICD-10-CM | POA: Diagnosis not present

## 2023-03-02 DIAGNOSIS — T8249XA Other complication of vascular dialysis catheter, initial encounter: Secondary | ICD-10-CM | POA: Diagnosis not present

## 2023-03-02 DIAGNOSIS — D509 Iron deficiency anemia, unspecified: Secondary | ICD-10-CM | POA: Diagnosis not present

## 2023-03-04 DIAGNOSIS — D631 Anemia in chronic kidney disease: Secondary | ICD-10-CM | POA: Diagnosis not present

## 2023-03-04 DIAGNOSIS — N2581 Secondary hyperparathyroidism of renal origin: Secondary | ICD-10-CM | POA: Diagnosis not present

## 2023-03-04 DIAGNOSIS — Z23 Encounter for immunization: Secondary | ICD-10-CM | POA: Diagnosis not present

## 2023-03-04 DIAGNOSIS — Z992 Dependence on renal dialysis: Secondary | ICD-10-CM | POA: Diagnosis not present

## 2023-03-04 DIAGNOSIS — R197 Diarrhea, unspecified: Secondary | ICD-10-CM | POA: Diagnosis not present

## 2023-03-04 DIAGNOSIS — T8249XA Other complication of vascular dialysis catheter, initial encounter: Secondary | ICD-10-CM | POA: Diagnosis not present

## 2023-03-04 DIAGNOSIS — D689 Coagulation defect, unspecified: Secondary | ICD-10-CM | POA: Diagnosis not present

## 2023-03-04 DIAGNOSIS — N186 End stage renal disease: Secondary | ICD-10-CM | POA: Diagnosis not present

## 2023-03-04 DIAGNOSIS — D509 Iron deficiency anemia, unspecified: Secondary | ICD-10-CM | POA: Diagnosis not present

## 2023-03-06 DIAGNOSIS — Z992 Dependence on renal dialysis: Secondary | ICD-10-CM | POA: Diagnosis not present

## 2023-03-06 DIAGNOSIS — N186 End stage renal disease: Secondary | ICD-10-CM | POA: Diagnosis not present

## 2023-03-06 DIAGNOSIS — D631 Anemia in chronic kidney disease: Secondary | ICD-10-CM | POA: Diagnosis not present

## 2023-03-06 DIAGNOSIS — T8249XA Other complication of vascular dialysis catheter, initial encounter: Secondary | ICD-10-CM | POA: Diagnosis not present

## 2023-03-06 DIAGNOSIS — N2581 Secondary hyperparathyroidism of renal origin: Secondary | ICD-10-CM | POA: Diagnosis not present

## 2023-03-06 DIAGNOSIS — D689 Coagulation defect, unspecified: Secondary | ICD-10-CM | POA: Diagnosis not present

## 2023-03-06 DIAGNOSIS — D509 Iron deficiency anemia, unspecified: Secondary | ICD-10-CM | POA: Diagnosis not present

## 2023-03-06 DIAGNOSIS — Z23 Encounter for immunization: Secondary | ICD-10-CM | POA: Diagnosis not present

## 2023-03-06 DIAGNOSIS — R197 Diarrhea, unspecified: Secondary | ICD-10-CM | POA: Diagnosis not present

## 2023-03-06 DIAGNOSIS — I12 Hypertensive chronic kidney disease with 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: Secondary | ICD-10-CM | POA: Diagnosis not present

## 2023-03-09 DIAGNOSIS — N2581 Secondary hyperparathyroidism of renal origin: Secondary | ICD-10-CM | POA: Diagnosis not present

## 2023-03-09 DIAGNOSIS — T8249XA Other complication of vascular dialysis catheter, initial encounter: Secondary | ICD-10-CM | POA: Diagnosis not present

## 2023-03-09 DIAGNOSIS — D689 Coagulation defect, unspecified: Secondary | ICD-10-CM | POA: Diagnosis not present

## 2023-03-09 DIAGNOSIS — Z992 Dependence on renal dialysis: Secondary | ICD-10-CM | POA: Diagnosis not present

## 2023-03-09 DIAGNOSIS — D509 Iron deficiency anemia, unspecified: Secondary | ICD-10-CM | POA: Diagnosis not present

## 2023-03-09 DIAGNOSIS — R197 Diarrhea, unspecified: Secondary | ICD-10-CM | POA: Diagnosis not present

## 2023-03-09 DIAGNOSIS — D631 Anemia in chronic kidney disease: Secondary | ICD-10-CM | POA: Diagnosis not present

## 2023-03-09 DIAGNOSIS — N186 End stage renal disease: Secondary | ICD-10-CM | POA: Diagnosis not present

## 2023-03-11 DIAGNOSIS — D689 Coagulation defect, unspecified: Secondary | ICD-10-CM | POA: Diagnosis not present

## 2023-03-11 DIAGNOSIS — Z992 Dependence on renal dialysis: Secondary | ICD-10-CM | POA: Diagnosis not present

## 2023-03-11 DIAGNOSIS — D631 Anemia in chronic kidney disease: Secondary | ICD-10-CM | POA: Diagnosis not present

## 2023-03-11 DIAGNOSIS — R197 Diarrhea, unspecified: Secondary | ICD-10-CM | POA: Diagnosis not present

## 2023-03-11 DIAGNOSIS — N2581 Secondary hyperparathyroidism of renal origin: Secondary | ICD-10-CM | POA: Diagnosis not present

## 2023-03-11 DIAGNOSIS — D509 Iron deficiency anemia, unspecified: Secondary | ICD-10-CM | POA: Diagnosis not present

## 2023-03-11 DIAGNOSIS — N186 End stage renal disease: Secondary | ICD-10-CM | POA: Diagnosis not present

## 2023-03-11 DIAGNOSIS — T8249XA Other complication of vascular dialysis catheter, initial encounter: Secondary | ICD-10-CM | POA: Diagnosis not present

## 2023-03-12 DIAGNOSIS — R3915 Urgency of urination: Secondary | ICD-10-CM | POA: Diagnosis not present

## 2023-03-12 DIAGNOSIS — R310 Gross hematuria: Secondary | ICD-10-CM | POA: Diagnosis not present

## 2023-03-12 DIAGNOSIS — C778 Secondary and unspecified malignant neoplasm of lymph nodes of multiple regions: Secondary | ICD-10-CM | POA: Diagnosis not present

## 2023-03-12 DIAGNOSIS — N186 End stage renal disease: Secondary | ICD-10-CM | POA: Diagnosis not present

## 2023-03-13 DIAGNOSIS — D509 Iron deficiency anemia, unspecified: Secondary | ICD-10-CM | POA: Diagnosis not present

## 2023-03-13 DIAGNOSIS — D631 Anemia in chronic kidney disease: Secondary | ICD-10-CM | POA: Diagnosis not present

## 2023-03-13 DIAGNOSIS — T8249XA Other complication of vascular dialysis catheter, initial encounter: Secondary | ICD-10-CM | POA: Diagnosis not present

## 2023-03-13 DIAGNOSIS — N186 End stage renal disease: Secondary | ICD-10-CM | POA: Diagnosis not present

## 2023-03-13 DIAGNOSIS — D689 Coagulation defect, unspecified: Secondary | ICD-10-CM | POA: Diagnosis not present

## 2023-03-13 DIAGNOSIS — R197 Diarrhea, unspecified: Secondary | ICD-10-CM | POA: Diagnosis not present

## 2023-03-13 DIAGNOSIS — Z992 Dependence on renal dialysis: Secondary | ICD-10-CM | POA: Diagnosis not present

## 2023-03-13 DIAGNOSIS — N2581 Secondary hyperparathyroidism of renal origin: Secondary | ICD-10-CM | POA: Diagnosis not present

## 2023-03-16 DIAGNOSIS — R197 Diarrhea, unspecified: Secondary | ICD-10-CM | POA: Diagnosis not present

## 2023-03-16 DIAGNOSIS — T8249XA Other complication of vascular dialysis catheter, initial encounter: Secondary | ICD-10-CM | POA: Diagnosis not present

## 2023-03-16 DIAGNOSIS — N2581 Secondary hyperparathyroidism of renal origin: Secondary | ICD-10-CM | POA: Diagnosis not present

## 2023-03-16 DIAGNOSIS — N186 End stage renal disease: Secondary | ICD-10-CM | POA: Diagnosis not present

## 2023-03-16 DIAGNOSIS — D689 Coagulation defect, unspecified: Secondary | ICD-10-CM | POA: Diagnosis not present

## 2023-03-16 DIAGNOSIS — D631 Anemia in chronic kidney disease: Secondary | ICD-10-CM | POA: Diagnosis not present

## 2023-03-16 DIAGNOSIS — D509 Iron deficiency anemia, unspecified: Secondary | ICD-10-CM | POA: Diagnosis not present

## 2023-03-16 DIAGNOSIS — Z992 Dependence on renal dialysis: Secondary | ICD-10-CM | POA: Diagnosis not present

## 2023-03-18 DIAGNOSIS — D509 Iron deficiency anemia, unspecified: Secondary | ICD-10-CM | POA: Diagnosis not present

## 2023-03-18 DIAGNOSIS — D689 Coagulation defect, unspecified: Secondary | ICD-10-CM | POA: Diagnosis not present

## 2023-03-18 DIAGNOSIS — N2581 Secondary hyperparathyroidism of renal origin: Secondary | ICD-10-CM | POA: Diagnosis not present

## 2023-03-18 DIAGNOSIS — R197 Diarrhea, unspecified: Secondary | ICD-10-CM | POA: Diagnosis not present

## 2023-03-18 DIAGNOSIS — T8249XA Other complication of vascular dialysis catheter, initial encounter: Secondary | ICD-10-CM | POA: Diagnosis not present

## 2023-03-18 DIAGNOSIS — Z992 Dependence on renal dialysis: Secondary | ICD-10-CM | POA: Diagnosis not present

## 2023-03-18 DIAGNOSIS — D631 Anemia in chronic kidney disease: Secondary | ICD-10-CM | POA: Diagnosis not present

## 2023-03-18 DIAGNOSIS — N186 End stage renal disease: Secondary | ICD-10-CM | POA: Diagnosis not present

## 2023-03-20 ENCOUNTER — Ambulatory Visit: Payer: Self-pay | Admitting: Licensed Clinical Social Worker

## 2023-03-20 DIAGNOSIS — D631 Anemia in chronic kidney disease: Secondary | ICD-10-CM | POA: Diagnosis not present

## 2023-03-20 DIAGNOSIS — R197 Diarrhea, unspecified: Secondary | ICD-10-CM | POA: Diagnosis not present

## 2023-03-20 DIAGNOSIS — Z992 Dependence on renal dialysis: Secondary | ICD-10-CM | POA: Diagnosis not present

## 2023-03-20 DIAGNOSIS — D689 Coagulation defect, unspecified: Secondary | ICD-10-CM | POA: Diagnosis not present

## 2023-03-20 DIAGNOSIS — T8249XA Other complication of vascular dialysis catheter, initial encounter: Secondary | ICD-10-CM | POA: Diagnosis not present

## 2023-03-20 DIAGNOSIS — N186 End stage renal disease: Secondary | ICD-10-CM | POA: Diagnosis not present

## 2023-03-20 DIAGNOSIS — N2581 Secondary hyperparathyroidism of renal origin: Secondary | ICD-10-CM | POA: Diagnosis not present

## 2023-03-20 DIAGNOSIS — D509 Iron deficiency anemia, unspecified: Secondary | ICD-10-CM | POA: Diagnosis not present

## 2023-03-20 NOTE — Patient Instructions (Signed)
Visit Information  Thank you for taking time to visit with me today. Please don't hesitate to contact me if I can be of assistance to you.   Following are the goals we discussed today:   Goals Addressed             This Visit's Progress    COMPLETED: Care Coordination       Activities and task to complete in order to accomplish goals.   Follow up with housing resources discussed ([address], [place] [facility] and [place]) Complete SCAT application lcsw will complete You have decided not to move forward with counseling and would like to work with me on brief coping skills to assist you with managing grief and depressive symptoms  Start / continue relaxed breathing 3 times daily  Patient secured stable housing and will implement self care techniques to reduce stress and anxiety.  Goals reviewed 03/20/2023 Affordable application sent via [place] mail to address on file. Address verified by A. Felton Clinton           Please call the care guide team at [phone] if you need to cancel or reschedule your appointment.   If you are experiencing a Mental Health or Behavioral Health Crisis or need someone to talk to, please call 911   Patient verbalizes understanding of instructions and care plan provided today and agrees to view in MyChart. Active MyChart status and patient understanding of how to access instructions and care plan via MyChart confirmed with patient.     The patient has been provided with contact information for the care management team and has been advised to call with any health related questions or concerns.   Gwyndolyn Saxon MSW, LCSW Licensed Clinical Social Worker  [facility], Population Health Direct Dial: [phone]  Fax: [phone]

## 2023-03-20 NOTE — Patient Outreach (Signed)
  Care Coordination   Follow Up Visit Note   03/20/2023 Name: Majed Pellegrin MRN: [id number] DOB: [birth year]  Jeramyah Goodpasture Rivet is a 58 y.o. year old male who sees Shelby Mattocks, DO for primary care. I spoke with  [name] by phone today.  What matters to the patients health and wellness today?  Completed follow with Mr. Levick via phone. Pt reports overall improvement since increased family  support. LCSW A. Felton Clinton mail affordable housing applications via mail. Pt will complete applications.     Goals Addressed             This Visit's Progress    COMPLETED: Care Coordination       Activities and task to complete in order to accomplish goals.   Follow up with housing resources discussed ([address], [place] [facility] and [place]) Complete SCAT application lcsw will complete You have decided not to move forward with counseling and would like to work with me on brief coping skills to assist you with managing grief and depressive symptoms  Start / continue relaxed breathing 3 times daily  Patient secured stable housing and will implement self care techniques to reduce stress and anxiety.  Goals reviewed 03/20/2023 Affordable application sent via [place] mail to address on file. Address verified by A. Felton Clinton        SDOH assessments and interventions completed:  Yes  SDOH Interventions Today    Flowsheet Row Most Recent Value  SDOH Interventions   Food Insecurity Interventions Intervention Not Indicated  Housing Interventions Intervention Not Indicated  Transportation Interventions Intervention Not Indicated  Utilities Interventions Intervention Not Indicated        Care Coordination Interventions:  Yes, provided  Interventions Today    Flowsheet Row Most Recent Value  General Interventions   General Interventions Discussed/Reviewed Community Resources  [Pt expressed understanding of local affordable housing options.]  Education  Interventions   Education Provided Provided Education  Provided Verbal Education On [employer] provided with affordable housing applications. Pt reports receipt of application sent via mail. Encourage pt to complete applications and continue with affordable housing search]       Follow up plan: No further intervention required.   Encounter Outcome:  Patient Visit Completed   Gwyndolyn Saxon MSW, LCSW Licensed Clinical Social Worker  [facility], Population Health Direct Dial: [phone]  Fax: [phone]

## 2023-03-23 DIAGNOSIS — T8249XA Other complication of vascular dialysis catheter, initial encounter: Secondary | ICD-10-CM | POA: Diagnosis not present

## 2023-03-23 DIAGNOSIS — N2581 Secondary hyperparathyroidism of renal origin: Secondary | ICD-10-CM | POA: Diagnosis not present

## 2023-03-23 DIAGNOSIS — N186 End stage renal disease: Secondary | ICD-10-CM | POA: Diagnosis not present

## 2023-03-23 DIAGNOSIS — D631 Anemia in chronic kidney disease: Secondary | ICD-10-CM | POA: Diagnosis not present

## 2023-03-23 DIAGNOSIS — R197 Diarrhea, unspecified: Secondary | ICD-10-CM | POA: Diagnosis not present

## 2023-03-23 DIAGNOSIS — D509 Iron deficiency anemia, unspecified: Secondary | ICD-10-CM | POA: Diagnosis not present

## 2023-03-23 DIAGNOSIS — Z992 Dependence on renal dialysis: Secondary | ICD-10-CM | POA: Diagnosis not present

## 2023-03-23 DIAGNOSIS — D689 Coagulation defect, unspecified: Secondary | ICD-10-CM | POA: Diagnosis not present

## 2023-03-25 DIAGNOSIS — R197 Diarrhea, unspecified: Secondary | ICD-10-CM | POA: Diagnosis not present

## 2023-03-25 DIAGNOSIS — Z992 Dependence on renal dialysis: Secondary | ICD-10-CM | POA: Diagnosis not present

## 2023-03-25 DIAGNOSIS — D509 Iron deficiency anemia, unspecified: Secondary | ICD-10-CM | POA: Diagnosis not present

## 2023-03-25 DIAGNOSIS — N2581 Secondary hyperparathyroidism of renal origin: Secondary | ICD-10-CM | POA: Diagnosis not present

## 2023-03-25 DIAGNOSIS — N186 End stage renal disease: Secondary | ICD-10-CM | POA: Diagnosis not present

## 2023-03-25 DIAGNOSIS — D631 Anemia in chronic kidney disease: Secondary | ICD-10-CM | POA: Diagnosis not present

## 2023-03-25 DIAGNOSIS — T8249XA Other complication of vascular dialysis catheter, initial encounter: Secondary | ICD-10-CM | POA: Diagnosis not present

## 2023-03-25 DIAGNOSIS — D689 Coagulation defect, unspecified: Secondary | ICD-10-CM | POA: Diagnosis not present

## 2023-03-26 ENCOUNTER — Other Ambulatory Visit: Payer: Self-pay | Admitting: *Deleted

## 2023-03-26 NOTE — Patient Instructions (Signed)
Visit Information  Thank you for taking time to visit with me today. Please don't hesitate to contact me if I can be of assistance to you before our next scheduled telephone appointment.  Following are the goals we discussed today:   Goals Addressed             This Visit's Progress    RNCM Care Management Expected Outcomes: Monitor, Self-Manage and Reduce Symptoms of: HTN       Current Barriers:  Care Coordination needs related to Housing barriers Chronic Disease Management support and education needs related to HTN   RNCM Clinical Goal(s):  Patient will verbalize basic understanding of  HTN disease process and self health management plan as evidenced by verbal explanation, recognizing symptoms, lifestyle modifications take all medications exactly as prescribed and will call provider for medication related questions as evidenced by compliance with all medications attend all scheduled medical appointments: with primary care provider and specialist as evidenced by keeping all scheduled appointments demonstrate Improved and Ongoing adherence to prescribed treatment plan for HTN as evidenced by consistent medication compliance, symptom monitoring, continued lifestyle modifications continue to work with RN Care Manager to address care management and care coordination needs related to  HTN as evidenced by adherence to CM Team Scheduled appointments through collaboration with RN Care manager, provider, and care team.   Interventions: Evaluation of current treatment plan related to  self management and patient's adherence to plan as established by provider   Hypertension Interventions:  (Status:  Goal on track:  Yes.) Long Term Goal Last practice recorded BP readings:  BP Readings from Last 3 Encounters:  02/11/23 (!) 137/92  01/13/23 118/78  12/23/22 (!) 191/105   Most recent eGFR/CrCl:  Lab Results  Component Value Date   EGFR 4 (L) 11/06/2022    No components found for:  "CRCL"  Evaluation of current treatment plan related to hypertension self management and patient's adherence to plan as established by provider. Does not check BP at home. States his BP is checked/monitored 3x/week at HD. Denies any chest pain, dizziness at this time. He does report mild swelling  and states he is watching his intake.  Provided education to patient re: stroke prevention, s/s of heart attack and stroke. Education & support provided. Reviewed medications with patient and discussed importance of compliance. Reports that he packed his BP away as he is preparing to move and has not been able to locate his medications. He states he told nephrology and they sent in a new prescription to the pharmacy. Due to the inclement weather, he is unable to get to the pharmacy so he has started to unpack boxes to try and find his medications. Counseled on adverse effects of illicit drug and excessive alcohol use in patients with high blood pressure  Counseled on the importance of exercise goals with target of 150 minutes per week Discussed plans with patient for ongoing care management follow up and provided patient with direct contact information for care management team Advised patient, providing education and rationale, to monitor blood pressure daily and record, calling PCP for findings outside established parameters Reviewed scheduled/upcoming provider appointments including: No follow ups scheduled at this time Advised patient to discuss any new changes with provider Provided education on prescribed diet Renal diet Discussed complications of poorly controlled blood pressure such as heart disease, stroke, circulatory complications, vision complications, kidney impairment, sexual dysfunction Screening for signs and symptoms of depression related to chronic disease state  Assessed social determinant of  health barriers  Patient Goals/Self-Care Activities: Take all medications as prescribed Attend  all scheduled provider appointments Call pharmacy for medication refills 3-7 days in advance of running out of medications Attend church or other social activities Perform IADL's (shopping, preparing meals, housekeeping, managing finances) independently Call provider office for new concerns or questions  Work with the social worker to address care coordination needs and will continue to work with the clinical team to address health care and disease management related needs check blood pressure daily keep all doctor appointments  Follow Up Plan:  Telephone follow up appointment with care management team member scheduled for:  04-23-2023 at 1:00 pm           Our next appointment is by telephone on 04-23-2023 at 1:00 pm  Please call the care guide team at [phone] if you need to cancel or reschedule your appointment.   If you are experiencing a Mental Health or Behavioral Health Crisis or need someone to talk to, please call the Suicide and Crisis Lifeline: 988 call the [place] National Suicide Prevention Lifeline: 602-312-4169 or TTY: 236 850 7622 TTY (681)803-3615) to talk to a trained counselor call 1-800-273-TALK (toll free, 24 hour hotline) go to [facility] [address], [place] [phone])   Patient verbalizes understanding of instructions and care plan provided today and agrees to view in MyChart. Active MyChart status and patient understanding of how to access instructions and care plan via MyChart confirmed with patient.     Telephone follow up appointment with care management team member scheduled for:04-23-2023 at 1:00 pm  Larey Brick, BSN RN [facility], [facility] RN Care Manager Direct Dial: [phone]  Fax: [phone]

## 2023-03-26 NOTE — Patient Outreach (Signed)
Care Management   Visit Note  03/26/2023 Name: James Wall MRN: [id number] DOB: 1965-05-17  Subjective: James Wall is a 58 y.o. year old male who is a primary care patient of Shelby Mattocks, DO. The Care Management team was consulted for assistance.      Engaged with patient spoke with patient by telephone.    Goals Addressed             This Visit's Progress    RNCM Care Management Expected Outcomes: Monitor, Self-Manage and Reduce Symptoms of: HTN       Current Barriers:  Care Coordination needs related to Housing barriers Chronic Disease Management support and education needs related to HTN   RNCM Clinical Goal(s):  Patient will verbalize basic understanding of  HTN disease process and self health management plan as evidenced by verbal explanation, recognizing symptoms, lifestyle modifications take all medications exactly as prescribed and will call provider for medication related questions as evidenced by compliance with all medications attend all scheduled medical appointments: with primary care provider and specialist as evidenced by keeping all scheduled appointments demonstrate Improved and Ongoing adherence to prescribed treatment plan for HTN as evidenced by consistent medication compliance, symptom monitoring, continued lifestyle modifications continue to work with RN Care Manager to address care management and care coordination needs related to  HTN as evidenced by adherence to CM Team Scheduled appointments through collaboration with RN Care manager, provider, and care team.   Interventions: Evaluation of current treatment plan related to  self management and patient's adherence to plan as established by provider   Hypertension Interventions:  (Status:  Goal on track:  Yes.) Long Term Goal Last practice recorded BP readings:  BP Readings from Last 3 Encounters:  02/11/23 (!) 137/92  01/13/23 118/78  12/23/22 (!) 191/105   Most recent eGFR/CrCl:   Lab Results  Component Value Date   EGFR 4 (L) 11/06/2022    No components found for: "CRCL"  Evaluation of current treatment plan related to hypertension self management and patient's adherence to plan as established by provider. Does not check BP at home. States his BP is checked/monitored 3x/week at HD. Denies any chest pain, dizziness at this time. He does report mild swelling  and states he is watching his intake.  Provided education to patient re: stroke prevention, s/s of heart attack and stroke. Education & support provided. Reviewed medications with patient and discussed importance of compliance. Reports that he packed his BP away as he is preparing to move and has not been able to locate his medications. He states he told nephrology and they sent in a new prescription to the pharmacy. Due to the inclement weather, he is unable to get to the pharmacy so he has started to unpack boxes to try and find his medications. Counseled on adverse effects of illicit drug and excessive alcohol use in patients with high blood pressure  Counseled on the importance of exercise goals with target of 150 minutes per week Discussed plans with patient for ongoing care management follow up and provided patient with direct contact information for care management team Advised patient, providing education and rationale, to monitor blood pressure daily and record, calling PCP for findings outside established parameters Reviewed scheduled/upcoming provider appointments including: No follow ups scheduled at this time Advised patient to discuss any new changes with provider Provided education on prescribed diet Renal diet Discussed complications of poorly controlled blood pressure such as heart disease, stroke, circulatory complications, vision complications, kidney impairment,  sexual dysfunction Screening for signs and symptoms of depression related to chronic disease state  Assessed social determinant of health  barriers  Patient Goals/Self-Care Activities: Take all medications as prescribed Attend all scheduled provider appointments Call pharmacy for medication refills 3-7 days in advance of running out of medications Attend church or other social activities Perform IADL's (shopping, preparing meals, housekeeping, managing finances) independently Call provider office for new concerns or questions  Work with the social worker to address care coordination needs and will continue to work with the clinical team to address health care and disease management related needs check blood pressure daily keep all doctor appointments  Follow Up Plan:  Telephone follow up appointment with care management team member scheduled for:  04-23-2023 at 1:00 pm           Consent to Services:  Patient was given information about care management services, agreed to services, and gave verbal consent to participate.   Plan: Telephone follow up appointment with care management team member scheduled for:04-23-2023 at 1:00 pm  Larey Brick, BSN RN [facility], [facility] RN Care Manager Direct Dial: [phone]  Fax: [phone]

## 2023-03-27 DIAGNOSIS — N186 End stage renal disease: Secondary | ICD-10-CM | POA: Diagnosis not present

## 2023-03-27 DIAGNOSIS — D631 Anemia in chronic kidney disease: Secondary | ICD-10-CM | POA: Diagnosis not present

## 2023-03-27 DIAGNOSIS — R197 Diarrhea, unspecified: Secondary | ICD-10-CM | POA: Diagnosis not present

## 2023-03-27 DIAGNOSIS — T8249XA Other complication of vascular dialysis catheter, initial encounter: Secondary | ICD-10-CM | POA: Diagnosis not present

## 2023-03-27 DIAGNOSIS — D689 Coagulation defect, unspecified: Secondary | ICD-10-CM | POA: Diagnosis not present

## 2023-03-27 DIAGNOSIS — D509 Iron deficiency anemia, unspecified: Secondary | ICD-10-CM | POA: Diagnosis not present

## 2023-03-27 DIAGNOSIS — N2581 Secondary hyperparathyroidism of renal origin: Secondary | ICD-10-CM | POA: Diagnosis not present

## 2023-03-27 DIAGNOSIS — Z992 Dependence on renal dialysis: Secondary | ICD-10-CM | POA: Diagnosis not present

## 2023-03-30 DIAGNOSIS — Z992 Dependence on renal dialysis: Secondary | ICD-10-CM | POA: Diagnosis not present

## 2023-03-30 DIAGNOSIS — D509 Iron deficiency anemia, unspecified: Secondary | ICD-10-CM | POA: Diagnosis not present

## 2023-03-30 DIAGNOSIS — T8249XA Other complication of vascular dialysis catheter, initial encounter: Secondary | ICD-10-CM | POA: Diagnosis not present

## 2023-03-30 DIAGNOSIS — N2581 Secondary hyperparathyroidism of renal origin: Secondary | ICD-10-CM | POA: Diagnosis not present

## 2023-03-30 DIAGNOSIS — D689 Coagulation defect, unspecified: Secondary | ICD-10-CM | POA: Diagnosis not present

## 2023-03-30 DIAGNOSIS — D631 Anemia in chronic kidney disease: Secondary | ICD-10-CM | POA: Diagnosis not present

## 2023-03-30 DIAGNOSIS — N186 End stage renal disease: Secondary | ICD-10-CM | POA: Diagnosis not present

## 2023-03-30 DIAGNOSIS — R197 Diarrhea, unspecified: Secondary | ICD-10-CM | POA: Diagnosis not present

## 2023-04-01 DIAGNOSIS — N2581 Secondary hyperparathyroidism of renal origin: Secondary | ICD-10-CM | POA: Diagnosis not present

## 2023-04-01 DIAGNOSIS — D509 Iron deficiency anemia, unspecified: Secondary | ICD-10-CM | POA: Diagnosis not present

## 2023-04-01 DIAGNOSIS — D689 Coagulation defect, unspecified: Secondary | ICD-10-CM | POA: Diagnosis not present

## 2023-04-01 DIAGNOSIS — R197 Diarrhea, unspecified: Secondary | ICD-10-CM | POA: Diagnosis not present

## 2023-04-01 DIAGNOSIS — N186 End stage renal disease: Secondary | ICD-10-CM | POA: Diagnosis not present

## 2023-04-01 DIAGNOSIS — D631 Anemia in chronic kidney disease: Secondary | ICD-10-CM | POA: Diagnosis not present

## 2023-04-01 DIAGNOSIS — Z992 Dependence on renal dialysis: Secondary | ICD-10-CM | POA: Diagnosis not present

## 2023-04-01 DIAGNOSIS — T8249XA Other complication of vascular dialysis catheter, initial encounter: Secondary | ICD-10-CM | POA: Diagnosis not present

## 2023-04-03 DIAGNOSIS — T8249XA Other complication of vascular dialysis catheter, initial encounter: Secondary | ICD-10-CM | POA: Diagnosis not present

## 2023-04-03 DIAGNOSIS — N186 End stage renal disease: Secondary | ICD-10-CM | POA: Diagnosis not present

## 2023-04-03 DIAGNOSIS — I12 Hypertensive chronic kidney disease with 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: Secondary | ICD-10-CM | POA: Diagnosis not present

## 2023-04-03 DIAGNOSIS — R197 Diarrhea, unspecified: Secondary | ICD-10-CM | POA: Diagnosis not present

## 2023-04-03 DIAGNOSIS — Z992 Dependence on renal dialysis: Secondary | ICD-10-CM | POA: Diagnosis not present

## 2023-04-03 DIAGNOSIS — N2581 Secondary hyperparathyroidism of renal origin: Secondary | ICD-10-CM | POA: Diagnosis not present

## 2023-04-03 DIAGNOSIS — D689 Coagulation defect, unspecified: Secondary | ICD-10-CM | POA: Diagnosis not present

## 2023-04-03 DIAGNOSIS — D631 Anemia in chronic kidney disease: Secondary | ICD-10-CM | POA: Diagnosis not present

## 2023-04-03 DIAGNOSIS — D509 Iron deficiency anemia, unspecified: Secondary | ICD-10-CM | POA: Diagnosis not present

## 2023-04-06 DIAGNOSIS — N186 End stage renal disease: Secondary | ICD-10-CM | POA: Diagnosis not present

## 2023-04-06 DIAGNOSIS — T8249XA Other complication of vascular dialysis catheter, initial encounter: Secondary | ICD-10-CM | POA: Diagnosis not present

## 2023-04-06 DIAGNOSIS — Z992 Dependence on renal dialysis: Secondary | ICD-10-CM | POA: Diagnosis not present

## 2023-04-06 DIAGNOSIS — D689 Coagulation defect, unspecified: Secondary | ICD-10-CM | POA: Diagnosis not present

## 2023-04-06 DIAGNOSIS — D631 Anemia in chronic kidney disease: Secondary | ICD-10-CM | POA: Diagnosis not present

## 2023-04-06 DIAGNOSIS — N2581 Secondary hyperparathyroidism of renal origin: Secondary | ICD-10-CM | POA: Diagnosis not present

## 2023-04-07 DIAGNOSIS — N186 End stage renal disease: Secondary | ICD-10-CM | POA: Diagnosis not present

## 2023-04-07 DIAGNOSIS — D689 Coagulation defect, unspecified: Secondary | ICD-10-CM | POA: Diagnosis not present

## 2023-04-07 DIAGNOSIS — Z992 Dependence on renal dialysis: Secondary | ICD-10-CM | POA: Diagnosis not present

## 2023-04-07 DIAGNOSIS — T8249XA Other complication of vascular dialysis catheter, initial encounter: Secondary | ICD-10-CM | POA: Diagnosis not present

## 2023-04-07 DIAGNOSIS — N2581 Secondary hyperparathyroidism of renal origin: Secondary | ICD-10-CM | POA: Diagnosis not present

## 2023-04-07 DIAGNOSIS — D631 Anemia in chronic kidney disease: Secondary | ICD-10-CM | POA: Diagnosis not present

## 2023-04-08 DIAGNOSIS — N2581 Secondary hyperparathyroidism of renal origin: Secondary | ICD-10-CM | POA: Diagnosis not present

## 2023-04-08 DIAGNOSIS — D631 Anemia in chronic kidney disease: Secondary | ICD-10-CM | POA: Diagnosis not present

## 2023-04-08 DIAGNOSIS — D689 Coagulation defect, unspecified: Secondary | ICD-10-CM | POA: Diagnosis not present

## 2023-04-08 DIAGNOSIS — N186 End stage renal disease: Secondary | ICD-10-CM | POA: Diagnosis not present

## 2023-04-08 DIAGNOSIS — Z992 Dependence on renal dialysis: Secondary | ICD-10-CM | POA: Diagnosis not present

## 2023-04-08 DIAGNOSIS — T8249XA Other complication of vascular dialysis catheter, initial encounter: Secondary | ICD-10-CM | POA: Diagnosis not present

## 2023-04-10 DIAGNOSIS — Z992 Dependence on renal dialysis: Secondary | ICD-10-CM | POA: Diagnosis not present

## 2023-04-10 DIAGNOSIS — D631 Anemia in chronic kidney disease: Secondary | ICD-10-CM | POA: Diagnosis not present

## 2023-04-10 DIAGNOSIS — N2581 Secondary hyperparathyroidism of renal origin: Secondary | ICD-10-CM | POA: Diagnosis not present

## 2023-04-10 DIAGNOSIS — D689 Coagulation defect, unspecified: Secondary | ICD-10-CM | POA: Diagnosis not present

## 2023-04-10 DIAGNOSIS — N186 End stage renal disease: Secondary | ICD-10-CM | POA: Diagnosis not present

## 2023-04-10 DIAGNOSIS — T8249XA Other complication of vascular dialysis catheter, initial encounter: Secondary | ICD-10-CM | POA: Diagnosis not present

## 2023-04-13 DIAGNOSIS — T8249XA Other complication of vascular dialysis catheter, initial encounter: Secondary | ICD-10-CM | POA: Diagnosis not present

## 2023-04-13 DIAGNOSIS — N186 End stage renal disease: Secondary | ICD-10-CM | POA: Diagnosis not present

## 2023-04-13 DIAGNOSIS — D631 Anemia in chronic kidney disease: Secondary | ICD-10-CM | POA: Diagnosis not present

## 2023-04-13 DIAGNOSIS — N2581 Secondary hyperparathyroidism of renal origin: Secondary | ICD-10-CM | POA: Diagnosis not present

## 2023-04-13 DIAGNOSIS — Z992 Dependence on renal dialysis: Secondary | ICD-10-CM | POA: Diagnosis not present

## 2023-04-13 DIAGNOSIS — D689 Coagulation defect, unspecified: Secondary | ICD-10-CM | POA: Diagnosis not present

## 2023-04-15 DIAGNOSIS — D689 Coagulation defect, unspecified: Secondary | ICD-10-CM | POA: Diagnosis not present

## 2023-04-15 DIAGNOSIS — N186 End stage renal disease: Secondary | ICD-10-CM | POA: Diagnosis not present

## 2023-04-15 DIAGNOSIS — N2581 Secondary hyperparathyroidism of renal origin: Secondary | ICD-10-CM | POA: Diagnosis not present

## 2023-04-15 DIAGNOSIS — Z992 Dependence on renal dialysis: Secondary | ICD-10-CM | POA: Diagnosis not present

## 2023-04-15 DIAGNOSIS — T8249XA Other complication of vascular dialysis catheter, initial encounter: Secondary | ICD-10-CM | POA: Diagnosis not present

## 2023-04-15 DIAGNOSIS — D631 Anemia in chronic kidney disease: Secondary | ICD-10-CM | POA: Diagnosis not present

## 2023-04-17 DIAGNOSIS — D631 Anemia in chronic kidney disease: Secondary | ICD-10-CM | POA: Diagnosis not present

## 2023-04-17 DIAGNOSIS — Z992 Dependence on renal dialysis: Secondary | ICD-10-CM | POA: Diagnosis not present

## 2023-04-17 DIAGNOSIS — N2581 Secondary hyperparathyroidism of renal origin: Secondary | ICD-10-CM | POA: Diagnosis not present

## 2023-04-17 DIAGNOSIS — T8249XA Other complication of vascular dialysis catheter, initial encounter: Secondary | ICD-10-CM | POA: Diagnosis not present

## 2023-04-17 DIAGNOSIS — D689 Coagulation defect, unspecified: Secondary | ICD-10-CM | POA: Diagnosis not present

## 2023-04-17 DIAGNOSIS — N186 End stage renal disease: Secondary | ICD-10-CM | POA: Diagnosis not present

## 2023-04-20 DIAGNOSIS — N186 End stage renal disease: Secondary | ICD-10-CM | POA: Diagnosis not present

## 2023-04-20 DIAGNOSIS — D689 Coagulation defect, unspecified: Secondary | ICD-10-CM | POA: Diagnosis not present

## 2023-04-20 DIAGNOSIS — D631 Anemia in chronic kidney disease: Secondary | ICD-10-CM | POA: Diagnosis not present

## 2023-04-20 DIAGNOSIS — Z992 Dependence on renal dialysis: Secondary | ICD-10-CM | POA: Diagnosis not present

## 2023-04-20 DIAGNOSIS — T8249XA Other complication of vascular dialysis catheter, initial encounter: Secondary | ICD-10-CM | POA: Diagnosis not present

## 2023-04-20 DIAGNOSIS — N2581 Secondary hyperparathyroidism of renal origin: Secondary | ICD-10-CM | POA: Diagnosis not present

## 2023-04-22 DIAGNOSIS — N2581 Secondary hyperparathyroidism of renal origin: Secondary | ICD-10-CM | POA: Diagnosis not present

## 2023-04-22 DIAGNOSIS — Z992 Dependence on renal dialysis: Secondary | ICD-10-CM | POA: Diagnosis not present

## 2023-04-22 DIAGNOSIS — D689 Coagulation defect, unspecified: Secondary | ICD-10-CM | POA: Diagnosis not present

## 2023-04-22 DIAGNOSIS — N186 End stage renal disease: Secondary | ICD-10-CM | POA: Diagnosis not present

## 2023-04-22 DIAGNOSIS — D631 Anemia in chronic kidney disease: Secondary | ICD-10-CM | POA: Diagnosis not present

## 2023-04-22 DIAGNOSIS — T8249XA Other complication of vascular dialysis catheter, initial encounter: Secondary | ICD-10-CM | POA: Diagnosis not present

## 2023-04-23 ENCOUNTER — Other Ambulatory Visit: Payer: Self-pay | Admitting: *Deleted

## 2023-04-23 ENCOUNTER — Encounter: Payer: Self-pay | Admitting: *Deleted

## 2023-04-23 NOTE — Patient Outreach (Signed)
 Care Management   Visit Note  04/23/2023 Name: James Wall MRN: [id number] DOB: [birth year]  Subjective: James Wall is a 58 y.o. year old male who is a primary care patient of Shelby Mattocks, DO. The Care Management team was consulted for assistance.      Engaged with patient spoke with patient by telephone.    Goals Addressed             This Visit's Progress    COMPLETED: RNCM Care Management Expected Outcomes: Monitor, Self-Manage and Reduce Symptoms of: HTN       Current Barriers:  Care Coordination needs related to Housing barriers Chronic Disease Management support and education needs related to HTN   RNCM Clinical Goal(s):  Patient will verbalize basic understanding of  HTN disease process and self health management plan as evidenced by verbal explanation, recognizing symptoms, lifestyle modifications take all medications exactly as prescribed and will call provider for medication related questions as evidenced by compliance with all medications attend all scheduled medical appointments: with primary care provider and specialist as evidenced by keeping all scheduled appointments demonstrate Improved and Ongoing adherence to prescribed treatment plan for HTN as evidenced by consistent medication compliance, symptom monitoring, continued lifestyle modifications continue to work with RN Care Manager to address care management and care coordination needs related to  HTN as evidenced by adherence to CM Team Scheduled appointments through collaboration with RN Care manager, provider, and care team.   Interventions: Evaluation of current treatment plan related to  self management and patient's adherence to plan as established by provider   Hypertension Interventions:  (Status:  Goal on track:  Yes.) Long Term Goal Last practice recorded BP readings:  BP Readings from Last 3 Encounters:  02/11/23 (!) 137/92  01/13/23 118/78  12/23/22 (!) 191/105   Most recent  eGFR/CrCl:  Lab Results  Component Value Date   EGFR 4 (L) 11/06/2022    No components found for: "CRCL"  Evaluation of current treatment plan related to hypertension self management and patient's adherence to plan as established by provider. Does not check BP at home. States his BP is checked/monitored 3x/week at HD. Denies any chest pain, dizziness at this time. He reports some swelling to his right leg and states that nephrology ordered imaging to rule out DVT. Provided education to patient re: stroke prevention, s/s of heart attack and stroke. Education & support provided. Reviewed medications with patient and discussed importance of compliance. Reports compliance Counseled on adverse effects of illicit drug and excessive alcohol use in patients with high blood pressure  Counseled on the importance of exercise goals with target of 150 minutes per week Discussed plans with patient for ongoing care management follow up and provided patient with direct contact information for care management team Advised patient, providing education and rationale, to monitor blood pressure daily and record, calling PCP for findings outside established parameters Reviewed scheduled/upcoming provider appointments including: No follow ups scheduled at this time Advised patient to discuss any new changes with provider Provided education on prescribed diet Renal diet Discussed complications of poorly controlled blood pressure such as heart disease, stroke, circulatory complications, vision complications, kidney impairment, sexual dysfunction Screening for signs and symptoms of depression related to chronic disease state  Assessed social determinant of health barriers  Patient Goals/Self-Care Activities: Take all medications as prescribed Attend all scheduled provider appointments Call pharmacy for medication refills 3-7 days in advance of running out of medications Attend church or other social  activities  Perform IADL's (shopping, preparing meals, housekeeping, managing finances) independently Call provider office for new concerns or questions  Work with the social worker to address care coordination needs and will continue to work with the clinical team to address health care and disease management related needs check blood pressure daily keep all doctor appointments  Follow Up Plan:  No further follow up required: Patient does not meet continued criteria for complex care management. Patient stable.           Consent to Services:  Patient was given information about care management services, agreed to services, and gave verbal consent to participate.   Plan: No further follow up required: Patient does not meet continued criteria for complex care management. Patient stable.  Larey Brick, BSN RN [facility], [facility] RN Care Manager Direct Dial: [phone]  Fax: [phone]

## 2023-04-23 NOTE — Patient Instructions (Signed)
 Visit Information  Thank you for taking time to visit with me today. Please don't hesitate to contact me if I can be of assistance to you before our next scheduled telephone appointment.  Following are the goals we discussed today:   Goals Addressed             This Visit's Progress    COMPLETED: RNCM Care Management Expected Outcomes: Monitor, Self-Manage and Reduce Symptoms of: HTN       Current Barriers:  Care Coordination needs related to Housing barriers Chronic Disease Management support and education needs related to HTN   RNCM Clinical Goal(s):  Patient will verbalize basic understanding of  HTN disease process and self health management plan as evidenced by verbal explanation, recognizing symptoms, lifestyle modifications take all medications exactly as prescribed and will call provider for medication related questions as evidenced by compliance with all medications attend all scheduled medical appointments: with primary care provider and specialist as evidenced by keeping all scheduled appointments demonstrate Improved and Ongoing adherence to prescribed treatment plan for HTN as evidenced by consistent medication compliance, symptom monitoring, continued lifestyle modifications continue to work with RN Care Manager to address care management and care coordination needs related to  HTN as evidenced by adherence to CM Team Scheduled appointments through collaboration with RN Care manager, provider, and care team.   Interventions: Evaluation of current treatment plan related to  self management and patient's adherence to plan as established by provider   Hypertension Interventions:  (Status:  Goal on track:  Yes.) Long Term Goal Last practice recorded BP readings:  BP Readings from Last 3 Encounters:  02/11/23 (!) 137/92  01/13/23 118/78  12/23/22 (!) 191/105   Most recent eGFR/CrCl:  Lab Results  Component Value Date   EGFR 4 (L) 11/06/2022    No components found for:  "CRCL"  Evaluation of current treatment plan related to hypertension self management and patient's adherence to plan as established by provider. Does not check BP at home. States his BP is checked/monitored 3x/week at HD. Denies any chest pain, dizziness at this time. He reports some swelling to his right leg and states that nephrology ordered imaging to rule out DVT. Provided education to patient re: stroke prevention, s/s of heart attack and stroke. Education & support provided. Reviewed medications with patient and discussed importance of compliance. Reports compliance Counseled on adverse effects of illicit drug and excessive alcohol use in patients with high blood pressure  Counseled on the importance of exercise goals with target of 150 minutes per week Discussed plans with patient for ongoing care management follow up and provided patient with direct contact information for care management team Advised patient, providing education and rationale, to monitor blood pressure daily and record, calling PCP for findings outside established parameters Reviewed scheduled/upcoming provider appointments including: No follow ups scheduled at this time Advised patient to discuss any new changes with provider Provided education on prescribed diet Renal diet Discussed complications of poorly controlled blood pressure such as heart disease, stroke, circulatory complications, vision complications, kidney impairment, sexual dysfunction Screening for signs and symptoms of depression related to chronic disease state  Assessed social determinant of health barriers  Patient Goals/Self-Care Activities: Take all medications as prescribed Attend all scheduled provider appointments Call pharmacy for medication refills 3-7 days in advance of running out of medications Attend church or other social activities Perform IADL's (shopping, preparing meals, housekeeping, managing finances) independently Call provider  office for new concerns or questions  Work  with the social worker to address care coordination needs and will continue to work with the clinical team to address health care and disease management related needs check blood pressure daily keep all doctor appointments  Follow Up Plan:  No further follow up required: Patient does not meet continued criteria for complex care management. Patient stable.            Please call the care guide team at [phone] if you need to cancel or reschedule your appointment.   If you are experiencing a Mental Health or Behavioral Health Crisis or need someone to talk to, please call the Suicide and Crisis Lifeline: 988 call the [place] National Suicide Prevention Lifeline: 619-051-7945 or TTY: 3197607912 TTY (773)552-0491) to talk to a trained counselor call 1-800-273-TALK (toll free, 24 hour hotline) go to [facility] [address] [place] [phone])   Patient verbalizes understanding of instructions and care plan provided today and agrees to view in MyChart. Active MyChart status and patient understanding of how to access instructions and care plan via MyChart confirmed with patient.     No further follow up required: Patient does not meet continued criteria for complex care management. Patient stable.  Larey Brick, BSN RN [facility], [facility] RN Care Manager Direct Dial: [phone]  Fax: [phone]

## 2023-04-24 DIAGNOSIS — D631 Anemia in chronic kidney disease: Secondary | ICD-10-CM | POA: Diagnosis not present

## 2023-04-24 DIAGNOSIS — D689 Coagulation defect, unspecified: Secondary | ICD-10-CM | POA: Diagnosis not present

## 2023-04-24 DIAGNOSIS — T8249XA Other complication of vascular dialysis catheter, initial encounter: Secondary | ICD-10-CM | POA: Diagnosis not present

## 2023-04-24 DIAGNOSIS — N2581 Secondary hyperparathyroidism of renal origin: Secondary | ICD-10-CM | POA: Diagnosis not present

## 2023-04-24 DIAGNOSIS — N186 End stage renal disease: Secondary | ICD-10-CM | POA: Diagnosis not present

## 2023-04-24 DIAGNOSIS — Z992 Dependence on renal dialysis: Secondary | ICD-10-CM | POA: Diagnosis not present

## 2023-04-27 DIAGNOSIS — N2581 Secondary hyperparathyroidism of renal origin: Secondary | ICD-10-CM | POA: Diagnosis not present

## 2023-04-27 DIAGNOSIS — N186 End stage renal disease: Secondary | ICD-10-CM | POA: Diagnosis not present

## 2023-04-27 DIAGNOSIS — D631 Anemia in chronic kidney disease: Secondary | ICD-10-CM | POA: Diagnosis not present

## 2023-04-27 DIAGNOSIS — Z992 Dependence on renal dialysis: Secondary | ICD-10-CM | POA: Diagnosis not present

## 2023-04-27 DIAGNOSIS — D689 Coagulation defect, unspecified: Secondary | ICD-10-CM | POA: Diagnosis not present

## 2023-04-27 DIAGNOSIS — T8249XA Other complication of vascular dialysis catheter, initial encounter: Secondary | ICD-10-CM | POA: Diagnosis not present

## 2023-04-29 DIAGNOSIS — D689 Coagulation defect, unspecified: Secondary | ICD-10-CM | POA: Diagnosis not present

## 2023-04-29 DIAGNOSIS — N186 End stage renal disease: Secondary | ICD-10-CM | POA: Diagnosis not present

## 2023-04-29 DIAGNOSIS — Z992 Dependence on renal dialysis: Secondary | ICD-10-CM | POA: Diagnosis not present

## 2023-04-29 DIAGNOSIS — N2581 Secondary hyperparathyroidism of renal origin: Secondary | ICD-10-CM | POA: Diagnosis not present

## 2023-04-29 DIAGNOSIS — D631 Anemia in chronic kidney disease: Secondary | ICD-10-CM | POA: Diagnosis not present

## 2023-04-29 DIAGNOSIS — T8249XA Other complication of vascular dialysis catheter, initial encounter: Secondary | ICD-10-CM | POA: Diagnosis not present

## 2023-05-01 DIAGNOSIS — N186 End stage renal disease: Secondary | ICD-10-CM | POA: Diagnosis not present

## 2023-05-01 DIAGNOSIS — Z992 Dependence on renal dialysis: Secondary | ICD-10-CM | POA: Diagnosis not present

## 2023-05-01 DIAGNOSIS — T8249XA Other complication of vascular dialysis catheter, initial encounter: Secondary | ICD-10-CM | POA: Diagnosis not present

## 2023-05-01 DIAGNOSIS — D631 Anemia in chronic kidney disease: Secondary | ICD-10-CM | POA: Diagnosis not present

## 2023-05-01 DIAGNOSIS — D689 Coagulation defect, unspecified: Secondary | ICD-10-CM | POA: Diagnosis not present

## 2023-05-01 DIAGNOSIS — N2581 Secondary hyperparathyroidism of renal origin: Secondary | ICD-10-CM | POA: Diagnosis not present

## 2023-05-04 DIAGNOSIS — I12 Hypertensive chronic kidney disease with 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: Secondary | ICD-10-CM | POA: Diagnosis not present

## 2023-05-04 DIAGNOSIS — N2581 Secondary hyperparathyroidism of renal origin: Secondary | ICD-10-CM | POA: Diagnosis not present

## 2023-05-04 DIAGNOSIS — N186 End stage renal disease: Secondary | ICD-10-CM | POA: Diagnosis not present

## 2023-05-04 DIAGNOSIS — Z992 Dependence on renal dialysis: Secondary | ICD-10-CM | POA: Diagnosis not present

## 2023-05-04 DIAGNOSIS — D689 Coagulation defect, unspecified: Secondary | ICD-10-CM | POA: Diagnosis not present

## 2023-05-04 DIAGNOSIS — T8249XA Other complication of vascular dialysis catheter, initial encounter: Secondary | ICD-10-CM | POA: Diagnosis not present

## 2023-05-04 DIAGNOSIS — D631 Anemia in chronic kidney disease: Secondary | ICD-10-CM | POA: Diagnosis not present

## 2023-05-06 DIAGNOSIS — R197 Diarrhea, unspecified: Secondary | ICD-10-CM | POA: Diagnosis not present

## 2023-05-06 DIAGNOSIS — N2581 Secondary hyperparathyroidism of renal origin: Secondary | ICD-10-CM | POA: Diagnosis not present

## 2023-05-06 DIAGNOSIS — D689 Coagulation defect, unspecified: Secondary | ICD-10-CM | POA: Diagnosis not present

## 2023-05-06 DIAGNOSIS — Z992 Dependence on renal dialysis: Secondary | ICD-10-CM | POA: Diagnosis not present

## 2023-05-06 DIAGNOSIS — T8249XA Other complication of vascular dialysis catheter, initial encounter: Secondary | ICD-10-CM | POA: Diagnosis not present

## 2023-05-06 DIAGNOSIS — N186 End stage renal disease: Secondary | ICD-10-CM | POA: Diagnosis not present

## 2023-05-06 DIAGNOSIS — D631 Anemia in chronic kidney disease: Secondary | ICD-10-CM | POA: Diagnosis not present

## 2023-05-06 DIAGNOSIS — D509 Iron deficiency anemia, unspecified: Secondary | ICD-10-CM | POA: Diagnosis not present

## 2023-05-07 DIAGNOSIS — H40023 Open angle with borderline findings, high risk, bilateral: Secondary | ICD-10-CM | POA: Diagnosis not present

## 2023-05-07 DIAGNOSIS — H25813 Combined forms of age-related cataract, bilateral: Secondary | ICD-10-CM | POA: Diagnosis not present

## 2023-05-07 DIAGNOSIS — H35341 Macular cyst, hole, or pseudohole, right eye: Secondary | ICD-10-CM | POA: Diagnosis not present

## 2023-05-07 DIAGNOSIS — H35372 Puckering of macula, left eye: Secondary | ICD-10-CM | POA: Diagnosis not present

## 2023-05-07 DIAGNOSIS — H524 Presbyopia: Secondary | ICD-10-CM | POA: Diagnosis not present

## 2023-05-08 DIAGNOSIS — Z992 Dependence on renal dialysis: Secondary | ICD-10-CM | POA: Diagnosis not present

## 2023-05-08 DIAGNOSIS — D631 Anemia in chronic kidney disease: Secondary | ICD-10-CM | POA: Diagnosis not present

## 2023-05-08 DIAGNOSIS — D509 Iron deficiency anemia, unspecified: Secondary | ICD-10-CM | POA: Diagnosis not present

## 2023-05-08 DIAGNOSIS — T8249XA Other complication of vascular dialysis catheter, initial encounter: Secondary | ICD-10-CM | POA: Diagnosis not present

## 2023-05-08 DIAGNOSIS — D689 Coagulation defect, unspecified: Secondary | ICD-10-CM | POA: Diagnosis not present

## 2023-05-08 DIAGNOSIS — R197 Diarrhea, unspecified: Secondary | ICD-10-CM | POA: Diagnosis not present

## 2023-05-08 DIAGNOSIS — N186 End stage renal disease: Secondary | ICD-10-CM | POA: Diagnosis not present

## 2023-05-08 DIAGNOSIS — N2581 Secondary hyperparathyroidism of renal origin: Secondary | ICD-10-CM | POA: Diagnosis not present

## 2023-05-11 DIAGNOSIS — R197 Diarrhea, unspecified: Secondary | ICD-10-CM | POA: Diagnosis not present

## 2023-05-11 DIAGNOSIS — N186 End stage renal disease: Secondary | ICD-10-CM | POA: Diagnosis not present

## 2023-05-11 DIAGNOSIS — D689 Coagulation defect, unspecified: Secondary | ICD-10-CM | POA: Diagnosis not present

## 2023-05-11 DIAGNOSIS — N2581 Secondary hyperparathyroidism of renal origin: Secondary | ICD-10-CM | POA: Diagnosis not present

## 2023-05-11 DIAGNOSIS — T8249XA Other complication of vascular dialysis catheter, initial encounter: Secondary | ICD-10-CM | POA: Diagnosis not present

## 2023-05-11 DIAGNOSIS — D509 Iron deficiency anemia, unspecified: Secondary | ICD-10-CM | POA: Diagnosis not present

## 2023-05-11 DIAGNOSIS — Z992 Dependence on renal dialysis: Secondary | ICD-10-CM | POA: Diagnosis not present

## 2023-05-11 DIAGNOSIS — D631 Anemia in chronic kidney disease: Secondary | ICD-10-CM | POA: Diagnosis not present

## 2023-05-13 DIAGNOSIS — T8249XA Other complication of vascular dialysis catheter, initial encounter: Secondary | ICD-10-CM | POA: Diagnosis not present

## 2023-05-13 DIAGNOSIS — N2581 Secondary hyperparathyroidism of renal origin: Secondary | ICD-10-CM | POA: Diagnosis not present

## 2023-05-13 DIAGNOSIS — D509 Iron deficiency anemia, unspecified: Secondary | ICD-10-CM | POA: Diagnosis not present

## 2023-05-13 DIAGNOSIS — Z992 Dependence on renal dialysis: Secondary | ICD-10-CM | POA: Diagnosis not present

## 2023-05-13 DIAGNOSIS — D689 Coagulation defect, unspecified: Secondary | ICD-10-CM | POA: Diagnosis not present

## 2023-05-13 DIAGNOSIS — R197 Diarrhea, unspecified: Secondary | ICD-10-CM | POA: Diagnosis not present

## 2023-05-13 DIAGNOSIS — D631 Anemia in chronic kidney disease: Secondary | ICD-10-CM | POA: Diagnosis not present

## 2023-05-13 DIAGNOSIS — N186 End stage renal disease: Secondary | ICD-10-CM | POA: Diagnosis not present

## 2023-05-15 DIAGNOSIS — D509 Iron deficiency anemia, unspecified: Secondary | ICD-10-CM | POA: Diagnosis not present

## 2023-05-15 DIAGNOSIS — Z992 Dependence on renal dialysis: Secondary | ICD-10-CM | POA: Diagnosis not present

## 2023-05-15 DIAGNOSIS — D689 Coagulation defect, unspecified: Secondary | ICD-10-CM | POA: Diagnosis not present

## 2023-05-15 DIAGNOSIS — N2581 Secondary hyperparathyroidism of renal origin: Secondary | ICD-10-CM | POA: Diagnosis not present

## 2023-05-15 DIAGNOSIS — N186 End stage renal disease: Secondary | ICD-10-CM | POA: Diagnosis not present

## 2023-05-15 DIAGNOSIS — D631 Anemia in chronic kidney disease: Secondary | ICD-10-CM | POA: Diagnosis not present

## 2023-05-15 DIAGNOSIS — R197 Diarrhea, unspecified: Secondary | ICD-10-CM | POA: Diagnosis not present

## 2023-05-15 DIAGNOSIS — T8249XA Other complication of vascular dialysis catheter, initial encounter: Secondary | ICD-10-CM | POA: Diagnosis not present

## 2023-05-18 DIAGNOSIS — D689 Coagulation defect, unspecified: Secondary | ICD-10-CM | POA: Diagnosis not present

## 2023-05-18 DIAGNOSIS — D509 Iron deficiency anemia, unspecified: Secondary | ICD-10-CM | POA: Diagnosis not present

## 2023-05-18 DIAGNOSIS — Z992 Dependence on renal dialysis: Secondary | ICD-10-CM | POA: Diagnosis not present

## 2023-05-18 DIAGNOSIS — N2581 Secondary hyperparathyroidism of renal origin: Secondary | ICD-10-CM | POA: Diagnosis not present

## 2023-05-18 DIAGNOSIS — D631 Anemia in chronic kidney disease: Secondary | ICD-10-CM | POA: Diagnosis not present

## 2023-05-18 DIAGNOSIS — R197 Diarrhea, unspecified: Secondary | ICD-10-CM | POA: Diagnosis not present

## 2023-05-18 DIAGNOSIS — N186 End stage renal disease: Secondary | ICD-10-CM | POA: Diagnosis not present

## 2023-05-18 DIAGNOSIS — T8249XA Other complication of vascular dialysis catheter, initial encounter: Secondary | ICD-10-CM | POA: Diagnosis not present

## 2023-05-20 DIAGNOSIS — N186 End stage renal disease: Secondary | ICD-10-CM | POA: Diagnosis not present

## 2023-05-20 DIAGNOSIS — R197 Diarrhea, unspecified: Secondary | ICD-10-CM | POA: Diagnosis not present

## 2023-05-20 DIAGNOSIS — N2581 Secondary hyperparathyroidism of renal origin: Secondary | ICD-10-CM | POA: Diagnosis not present

## 2023-05-20 DIAGNOSIS — D689 Coagulation defect, unspecified: Secondary | ICD-10-CM | POA: Diagnosis not present

## 2023-05-20 DIAGNOSIS — D631 Anemia in chronic kidney disease: Secondary | ICD-10-CM | POA: Diagnosis not present

## 2023-05-20 DIAGNOSIS — T8249XA Other complication of vascular dialysis catheter, initial encounter: Secondary | ICD-10-CM | POA: Diagnosis not present

## 2023-05-20 DIAGNOSIS — Z992 Dependence on renal dialysis: Secondary | ICD-10-CM | POA: Diagnosis not present

## 2023-05-20 DIAGNOSIS — D509 Iron deficiency anemia, unspecified: Secondary | ICD-10-CM | POA: Diagnosis not present

## 2023-05-22 DIAGNOSIS — T8249XA Other complication of vascular dialysis catheter, initial encounter: Secondary | ICD-10-CM | POA: Diagnosis not present

## 2023-05-22 DIAGNOSIS — D509 Iron deficiency anemia, unspecified: Secondary | ICD-10-CM | POA: Diagnosis not present

## 2023-05-22 DIAGNOSIS — N2581 Secondary hyperparathyroidism of renal origin: Secondary | ICD-10-CM | POA: Diagnosis not present

## 2023-05-22 DIAGNOSIS — R197 Diarrhea, unspecified: Secondary | ICD-10-CM | POA: Diagnosis not present

## 2023-05-22 DIAGNOSIS — N186 End stage renal disease: Secondary | ICD-10-CM | POA: Diagnosis not present

## 2023-05-22 DIAGNOSIS — D631 Anemia in chronic kidney disease: Secondary | ICD-10-CM | POA: Diagnosis not present

## 2023-05-22 DIAGNOSIS — Z992 Dependence on renal dialysis: Secondary | ICD-10-CM | POA: Diagnosis not present

## 2023-05-22 DIAGNOSIS — D689 Coagulation defect, unspecified: Secondary | ICD-10-CM | POA: Diagnosis not present

## 2023-05-25 DIAGNOSIS — Z992 Dependence on renal dialysis: Secondary | ICD-10-CM | POA: Diagnosis not present

## 2023-05-25 DIAGNOSIS — N2581 Secondary hyperparathyroidism of renal origin: Secondary | ICD-10-CM | POA: Diagnosis not present

## 2023-05-25 DIAGNOSIS — N186 End stage renal disease: Secondary | ICD-10-CM | POA: Diagnosis not present

## 2023-05-25 DIAGNOSIS — R197 Diarrhea, unspecified: Secondary | ICD-10-CM | POA: Diagnosis not present

## 2023-05-25 DIAGNOSIS — D689 Coagulation defect, unspecified: Secondary | ICD-10-CM | POA: Diagnosis not present

## 2023-05-25 DIAGNOSIS — D509 Iron deficiency anemia, unspecified: Secondary | ICD-10-CM | POA: Diagnosis not present

## 2023-05-25 DIAGNOSIS — D631 Anemia in chronic kidney disease: Secondary | ICD-10-CM | POA: Diagnosis not present

## 2023-05-25 DIAGNOSIS — T8249XA Other complication of vascular dialysis catheter, initial encounter: Secondary | ICD-10-CM | POA: Diagnosis not present

## 2023-05-27 DIAGNOSIS — R197 Diarrhea, unspecified: Secondary | ICD-10-CM | POA: Diagnosis not present

## 2023-05-27 DIAGNOSIS — Z992 Dependence on renal dialysis: Secondary | ICD-10-CM | POA: Diagnosis not present

## 2023-05-27 DIAGNOSIS — N2581 Secondary hyperparathyroidism of renal origin: Secondary | ICD-10-CM | POA: Diagnosis not present

## 2023-05-27 DIAGNOSIS — D689 Coagulation defect, unspecified: Secondary | ICD-10-CM | POA: Diagnosis not present

## 2023-05-27 DIAGNOSIS — D509 Iron deficiency anemia, unspecified: Secondary | ICD-10-CM | POA: Diagnosis not present

## 2023-05-27 DIAGNOSIS — T8249XA Other complication of vascular dialysis catheter, initial encounter: Secondary | ICD-10-CM | POA: Diagnosis not present

## 2023-05-27 DIAGNOSIS — D631 Anemia in chronic kidney disease: Secondary | ICD-10-CM | POA: Diagnosis not present

## 2023-05-27 DIAGNOSIS — N186 End stage renal disease: Secondary | ICD-10-CM | POA: Diagnosis not present

## 2023-05-29 DIAGNOSIS — N2581 Secondary hyperparathyroidism of renal origin: Secondary | ICD-10-CM | POA: Diagnosis not present

## 2023-05-29 DIAGNOSIS — Z992 Dependence on renal dialysis: Secondary | ICD-10-CM | POA: Diagnosis not present

## 2023-05-29 DIAGNOSIS — T8249XA Other complication of vascular dialysis catheter, initial encounter: Secondary | ICD-10-CM | POA: Diagnosis not present

## 2023-05-29 DIAGNOSIS — N186 End stage renal disease: Secondary | ICD-10-CM | POA: Diagnosis not present

## 2023-05-29 DIAGNOSIS — D689 Coagulation defect, unspecified: Secondary | ICD-10-CM | POA: Diagnosis not present

## 2023-05-29 DIAGNOSIS — R197 Diarrhea, unspecified: Secondary | ICD-10-CM | POA: Diagnosis not present

## 2023-05-29 DIAGNOSIS — D631 Anemia in chronic kidney disease: Secondary | ICD-10-CM | POA: Diagnosis not present

## 2023-05-29 DIAGNOSIS — D509 Iron deficiency anemia, unspecified: Secondary | ICD-10-CM | POA: Diagnosis not present

## 2023-06-01 DIAGNOSIS — D689 Coagulation defect, unspecified: Secondary | ICD-10-CM | POA: Diagnosis not present

## 2023-06-01 DIAGNOSIS — D509 Iron deficiency anemia, unspecified: Secondary | ICD-10-CM | POA: Diagnosis not present

## 2023-06-01 DIAGNOSIS — N186 End stage renal disease: Secondary | ICD-10-CM | POA: Diagnosis not present

## 2023-06-01 DIAGNOSIS — T8249XA Other complication of vascular dialysis catheter, initial encounter: Secondary | ICD-10-CM | POA: Diagnosis not present

## 2023-06-01 DIAGNOSIS — N2581 Secondary hyperparathyroidism of renal origin: Secondary | ICD-10-CM | POA: Diagnosis not present

## 2023-06-01 DIAGNOSIS — Z992 Dependence on renal dialysis: Secondary | ICD-10-CM | POA: Diagnosis not present

## 2023-06-01 DIAGNOSIS — D631 Anemia in chronic kidney disease: Secondary | ICD-10-CM | POA: Diagnosis not present

## 2023-06-01 DIAGNOSIS — R197 Diarrhea, unspecified: Secondary | ICD-10-CM | POA: Diagnosis not present

## 2023-06-03 DIAGNOSIS — D689 Coagulation defect, unspecified: Secondary | ICD-10-CM | POA: Diagnosis not present

## 2023-06-03 DIAGNOSIS — D631 Anemia in chronic kidney disease: Secondary | ICD-10-CM | POA: Diagnosis not present

## 2023-06-03 DIAGNOSIS — T8249XA Other complication of vascular dialysis catheter, initial encounter: Secondary | ICD-10-CM | POA: Diagnosis not present

## 2023-06-03 DIAGNOSIS — Z992 Dependence on renal dialysis: Secondary | ICD-10-CM | POA: Diagnosis not present

## 2023-06-03 DIAGNOSIS — N2581 Secondary hyperparathyroidism of renal origin: Secondary | ICD-10-CM | POA: Diagnosis not present

## 2023-06-03 DIAGNOSIS — R197 Diarrhea, unspecified: Secondary | ICD-10-CM | POA: Diagnosis not present

## 2023-06-03 DIAGNOSIS — N186 End stage renal disease: Secondary | ICD-10-CM | POA: Diagnosis not present

## 2023-06-03 DIAGNOSIS — I12 Hypertensive chronic kidney disease with 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: Secondary | ICD-10-CM | POA: Diagnosis not present

## 2023-06-03 DIAGNOSIS — D509 Iron deficiency anemia, unspecified: Secondary | ICD-10-CM | POA: Diagnosis not present

## 2023-06-05 DIAGNOSIS — N186 End stage renal disease: Secondary | ICD-10-CM | POA: Diagnosis not present

## 2023-06-05 DIAGNOSIS — T8249XA Other complication of vascular dialysis catheter, initial encounter: Secondary | ICD-10-CM | POA: Diagnosis not present

## 2023-06-05 DIAGNOSIS — N2581 Secondary hyperparathyroidism of renal origin: Secondary | ICD-10-CM | POA: Diagnosis not present

## 2023-06-05 DIAGNOSIS — Z992 Dependence on renal dialysis: Secondary | ICD-10-CM | POA: Diagnosis not present

## 2023-06-05 DIAGNOSIS — D509 Iron deficiency anemia, unspecified: Secondary | ICD-10-CM | POA: Diagnosis not present

## 2023-06-05 DIAGNOSIS — D689 Coagulation defect, unspecified: Secondary | ICD-10-CM | POA: Diagnosis not present

## 2023-06-05 DIAGNOSIS — R197 Diarrhea, unspecified: Secondary | ICD-10-CM | POA: Diagnosis not present

## 2023-06-05 DIAGNOSIS — D631 Anemia in chronic kidney disease: Secondary | ICD-10-CM | POA: Diagnosis not present

## 2023-06-08 DIAGNOSIS — D509 Iron deficiency anemia, unspecified: Secondary | ICD-10-CM | POA: Diagnosis not present

## 2023-06-08 DIAGNOSIS — N186 End stage renal disease: Secondary | ICD-10-CM | POA: Diagnosis not present

## 2023-06-08 DIAGNOSIS — N2581 Secondary hyperparathyroidism of renal origin: Secondary | ICD-10-CM | POA: Diagnosis not present

## 2023-06-08 DIAGNOSIS — T8249XA Other complication of vascular dialysis catheter, initial encounter: Secondary | ICD-10-CM | POA: Diagnosis not present

## 2023-06-08 DIAGNOSIS — D689 Coagulation defect, unspecified: Secondary | ICD-10-CM | POA: Diagnosis not present

## 2023-06-08 DIAGNOSIS — Z992 Dependence on renal dialysis: Secondary | ICD-10-CM | POA: Diagnosis not present

## 2023-06-08 DIAGNOSIS — D631 Anemia in chronic kidney disease: Secondary | ICD-10-CM | POA: Diagnosis not present

## 2023-06-08 DIAGNOSIS — R197 Diarrhea, unspecified: Secondary | ICD-10-CM | POA: Diagnosis not present

## 2023-06-10 DIAGNOSIS — D509 Iron deficiency anemia, unspecified: Secondary | ICD-10-CM | POA: Diagnosis not present

## 2023-06-10 DIAGNOSIS — D689 Coagulation defect, unspecified: Secondary | ICD-10-CM | POA: Diagnosis not present

## 2023-06-10 DIAGNOSIS — Z992 Dependence on renal dialysis: Secondary | ICD-10-CM | POA: Diagnosis not present

## 2023-06-10 DIAGNOSIS — D631 Anemia in chronic kidney disease: Secondary | ICD-10-CM | POA: Diagnosis not present

## 2023-06-10 DIAGNOSIS — T8249XA Other complication of vascular dialysis catheter, initial encounter: Secondary | ICD-10-CM | POA: Diagnosis not present

## 2023-06-10 DIAGNOSIS — N186 End stage renal disease: Secondary | ICD-10-CM | POA: Diagnosis not present

## 2023-06-10 DIAGNOSIS — N2581 Secondary hyperparathyroidism of renal origin: Secondary | ICD-10-CM | POA: Diagnosis not present

## 2023-06-10 DIAGNOSIS — R197 Diarrhea, unspecified: Secondary | ICD-10-CM | POA: Diagnosis not present

## 2023-06-12 DIAGNOSIS — Z992 Dependence on renal dialysis: Secondary | ICD-10-CM | POA: Diagnosis not present

## 2023-06-12 DIAGNOSIS — R197 Diarrhea, unspecified: Secondary | ICD-10-CM | POA: Diagnosis not present

## 2023-06-12 DIAGNOSIS — N186 End stage renal disease: Secondary | ICD-10-CM | POA: Diagnosis not present

## 2023-06-12 DIAGNOSIS — D509 Iron deficiency anemia, unspecified: Secondary | ICD-10-CM | POA: Diagnosis not present

## 2023-06-12 DIAGNOSIS — D631 Anemia in chronic kidney disease: Secondary | ICD-10-CM | POA: Diagnosis not present

## 2023-06-12 DIAGNOSIS — T8249XA Other complication of vascular dialysis catheter, initial encounter: Secondary | ICD-10-CM | POA: Diagnosis not present

## 2023-06-12 DIAGNOSIS — N2581 Secondary hyperparathyroidism of renal origin: Secondary | ICD-10-CM | POA: Diagnosis not present

## 2023-06-12 DIAGNOSIS — D689 Coagulation defect, unspecified: Secondary | ICD-10-CM | POA: Diagnosis not present

## 2023-06-15 DIAGNOSIS — Z992 Dependence on renal dialysis: Secondary | ICD-10-CM | POA: Diagnosis not present

## 2023-06-15 DIAGNOSIS — R197 Diarrhea, unspecified: Secondary | ICD-10-CM | POA: Diagnosis not present

## 2023-06-15 DIAGNOSIS — D631 Anemia in chronic kidney disease: Secondary | ICD-10-CM | POA: Diagnosis not present

## 2023-06-15 DIAGNOSIS — T8249XA Other complication of vascular dialysis catheter, initial encounter: Secondary | ICD-10-CM | POA: Diagnosis not present

## 2023-06-15 DIAGNOSIS — D689 Coagulation defect, unspecified: Secondary | ICD-10-CM | POA: Diagnosis not present

## 2023-06-15 DIAGNOSIS — N2581 Secondary hyperparathyroidism of renal origin: Secondary | ICD-10-CM | POA: Diagnosis not present

## 2023-06-15 DIAGNOSIS — D509 Iron deficiency anemia, unspecified: Secondary | ICD-10-CM | POA: Diagnosis not present

## 2023-06-15 DIAGNOSIS — N186 End stage renal disease: Secondary | ICD-10-CM | POA: Diagnosis not present

## 2023-06-17 DIAGNOSIS — D631 Anemia in chronic kidney disease: Secondary | ICD-10-CM | POA: Diagnosis not present

## 2023-06-17 DIAGNOSIS — R197 Diarrhea, unspecified: Secondary | ICD-10-CM | POA: Diagnosis not present

## 2023-06-17 DIAGNOSIS — N2581 Secondary hyperparathyroidism of renal origin: Secondary | ICD-10-CM | POA: Diagnosis not present

## 2023-06-17 DIAGNOSIS — N186 End stage renal disease: Secondary | ICD-10-CM | POA: Diagnosis not present

## 2023-06-17 DIAGNOSIS — D509 Iron deficiency anemia, unspecified: Secondary | ICD-10-CM | POA: Diagnosis not present

## 2023-06-17 DIAGNOSIS — D689 Coagulation defect, unspecified: Secondary | ICD-10-CM | POA: Diagnosis not present

## 2023-06-17 DIAGNOSIS — Z992 Dependence on renal dialysis: Secondary | ICD-10-CM | POA: Diagnosis not present

## 2023-06-17 DIAGNOSIS — T8249XA Other complication of vascular dialysis catheter, initial encounter: Secondary | ICD-10-CM | POA: Diagnosis not present

## 2023-06-22 DIAGNOSIS — T8249XA Other complication of vascular dialysis catheter, initial encounter: Secondary | ICD-10-CM | POA: Diagnosis not present

## 2023-06-22 DIAGNOSIS — N2581 Secondary hyperparathyroidism of renal origin: Secondary | ICD-10-CM | POA: Diagnosis not present

## 2023-06-22 DIAGNOSIS — D689 Coagulation defect, unspecified: Secondary | ICD-10-CM | POA: Diagnosis not present

## 2023-06-22 DIAGNOSIS — N186 End stage renal disease: Secondary | ICD-10-CM | POA: Diagnosis not present

## 2023-06-22 DIAGNOSIS — D631 Anemia in chronic kidney disease: Secondary | ICD-10-CM | POA: Diagnosis not present

## 2023-06-22 DIAGNOSIS — R197 Diarrhea, unspecified: Secondary | ICD-10-CM | POA: Diagnosis not present

## 2023-06-22 DIAGNOSIS — Z992 Dependence on renal dialysis: Secondary | ICD-10-CM | POA: Diagnosis not present

## 2023-06-22 DIAGNOSIS — D509 Iron deficiency anemia, unspecified: Secondary | ICD-10-CM | POA: Diagnosis not present

## 2023-06-24 DIAGNOSIS — T8249XA Other complication of vascular dialysis catheter, initial encounter: Secondary | ICD-10-CM | POA: Diagnosis not present

## 2023-06-24 DIAGNOSIS — Z992 Dependence on renal dialysis: Secondary | ICD-10-CM | POA: Diagnosis not present

## 2023-06-24 DIAGNOSIS — R197 Diarrhea, unspecified: Secondary | ICD-10-CM | POA: Diagnosis not present

## 2023-06-24 DIAGNOSIS — D509 Iron deficiency anemia, unspecified: Secondary | ICD-10-CM | POA: Diagnosis not present

## 2023-06-24 DIAGNOSIS — N2581 Secondary hyperparathyroidism of renal origin: Secondary | ICD-10-CM | POA: Diagnosis not present

## 2023-06-24 DIAGNOSIS — D631 Anemia in chronic kidney disease: Secondary | ICD-10-CM | POA: Diagnosis not present

## 2023-06-24 DIAGNOSIS — D689 Coagulation defect, unspecified: Secondary | ICD-10-CM | POA: Diagnosis not present

## 2023-06-24 DIAGNOSIS — N186 End stage renal disease: Secondary | ICD-10-CM | POA: Diagnosis not present

## 2023-06-26 DIAGNOSIS — R197 Diarrhea, unspecified: Secondary | ICD-10-CM | POA: Diagnosis not present

## 2023-06-26 DIAGNOSIS — Z992 Dependence on renal dialysis: Secondary | ICD-10-CM | POA: Diagnosis not present

## 2023-06-26 DIAGNOSIS — D631 Anemia in chronic kidney disease: Secondary | ICD-10-CM | POA: Diagnosis not present

## 2023-06-26 DIAGNOSIS — D689 Coagulation defect, unspecified: Secondary | ICD-10-CM | POA: Diagnosis not present

## 2023-06-26 DIAGNOSIS — T8249XA Other complication of vascular dialysis catheter, initial encounter: Secondary | ICD-10-CM | POA: Diagnosis not present

## 2023-06-26 DIAGNOSIS — N2581 Secondary hyperparathyroidism of renal origin: Secondary | ICD-10-CM | POA: Diagnosis not present

## 2023-06-26 DIAGNOSIS — D509 Iron deficiency anemia, unspecified: Secondary | ICD-10-CM | POA: Diagnosis not present

## 2023-06-26 DIAGNOSIS — N186 End stage renal disease: Secondary | ICD-10-CM | POA: Diagnosis not present

## 2023-06-29 DIAGNOSIS — R197 Diarrhea, unspecified: Secondary | ICD-10-CM | POA: Diagnosis not present

## 2023-06-29 DIAGNOSIS — D631 Anemia in chronic kidney disease: Secondary | ICD-10-CM | POA: Diagnosis not present

## 2023-06-29 DIAGNOSIS — N2581 Secondary hyperparathyroidism of renal origin: Secondary | ICD-10-CM | POA: Diagnosis not present

## 2023-06-29 DIAGNOSIS — N186 End stage renal disease: Secondary | ICD-10-CM | POA: Diagnosis not present

## 2023-06-29 DIAGNOSIS — T8249XA Other complication of vascular dialysis catheter, initial encounter: Secondary | ICD-10-CM | POA: Diagnosis not present

## 2023-06-29 DIAGNOSIS — Z992 Dependence on renal dialysis: Secondary | ICD-10-CM | POA: Diagnosis not present

## 2023-06-29 DIAGNOSIS — D689 Coagulation defect, unspecified: Secondary | ICD-10-CM | POA: Diagnosis not present

## 2023-06-29 DIAGNOSIS — D509 Iron deficiency anemia, unspecified: Secondary | ICD-10-CM | POA: Diagnosis not present

## 2023-07-01 DIAGNOSIS — T8249XA Other complication of vascular dialysis catheter, initial encounter: Secondary | ICD-10-CM | POA: Diagnosis not present

## 2023-07-01 DIAGNOSIS — R197 Diarrhea, unspecified: Secondary | ICD-10-CM | POA: Diagnosis not present

## 2023-07-01 DIAGNOSIS — D631 Anemia in chronic kidney disease: Secondary | ICD-10-CM | POA: Diagnosis not present

## 2023-07-01 DIAGNOSIS — Z992 Dependence on renal dialysis: Secondary | ICD-10-CM | POA: Diagnosis not present

## 2023-07-01 DIAGNOSIS — D509 Iron deficiency anemia, unspecified: Secondary | ICD-10-CM | POA: Diagnosis not present

## 2023-07-01 DIAGNOSIS — D689 Coagulation defect, unspecified: Secondary | ICD-10-CM | POA: Diagnosis not present

## 2023-07-01 DIAGNOSIS — N186 End stage renal disease: Secondary | ICD-10-CM | POA: Diagnosis not present

## 2023-07-01 DIAGNOSIS — N2581 Secondary hyperparathyroidism of renal origin: Secondary | ICD-10-CM | POA: Diagnosis not present

## 2023-07-03 DIAGNOSIS — T8249XA Other complication of vascular dialysis catheter, initial encounter: Secondary | ICD-10-CM | POA: Diagnosis not present

## 2023-07-03 DIAGNOSIS — N186 End stage renal disease: Secondary | ICD-10-CM | POA: Diagnosis not present

## 2023-07-03 DIAGNOSIS — D631 Anemia in chronic kidney disease: Secondary | ICD-10-CM | POA: Diagnosis not present

## 2023-07-03 DIAGNOSIS — D689 Coagulation defect, unspecified: Secondary | ICD-10-CM | POA: Diagnosis not present

## 2023-07-03 DIAGNOSIS — N2581 Secondary hyperparathyroidism of renal origin: Secondary | ICD-10-CM | POA: Diagnosis not present

## 2023-07-03 DIAGNOSIS — D509 Iron deficiency anemia, unspecified: Secondary | ICD-10-CM | POA: Diagnosis not present

## 2023-07-03 DIAGNOSIS — Z992 Dependence on renal dialysis: Secondary | ICD-10-CM | POA: Diagnosis not present

## 2023-07-03 DIAGNOSIS — R197 Diarrhea, unspecified: Secondary | ICD-10-CM | POA: Diagnosis not present

## 2023-07-04 DIAGNOSIS — Z992 Dependence on renal dialysis: Secondary | ICD-10-CM | POA: Diagnosis not present

## 2023-07-04 DIAGNOSIS — N186 End stage renal disease: Secondary | ICD-10-CM | POA: Diagnosis not present

## 2023-07-04 DIAGNOSIS — I12 Hypertensive chronic kidney disease with 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: Secondary | ICD-10-CM | POA: Diagnosis not present

## 2023-07-06 DIAGNOSIS — D631 Anemia in chronic kidney disease: Secondary | ICD-10-CM | POA: Diagnosis not present

## 2023-07-06 DIAGNOSIS — T8249XA Other complication of vascular dialysis catheter, initial encounter: Secondary | ICD-10-CM | POA: Diagnosis not present

## 2023-07-06 DIAGNOSIS — N2581 Secondary hyperparathyroidism of renal origin: Secondary | ICD-10-CM | POA: Diagnosis not present

## 2023-07-06 DIAGNOSIS — Z992 Dependence on renal dialysis: Secondary | ICD-10-CM | POA: Diagnosis not present

## 2023-07-06 DIAGNOSIS — D509 Iron deficiency anemia, unspecified: Secondary | ICD-10-CM | POA: Diagnosis not present

## 2023-07-06 DIAGNOSIS — N186 End stage renal disease: Secondary | ICD-10-CM | POA: Diagnosis not present

## 2023-07-06 DIAGNOSIS — D689 Coagulation defect, unspecified: Secondary | ICD-10-CM | POA: Diagnosis not present

## 2023-07-06 DIAGNOSIS — R197 Diarrhea, unspecified: Secondary | ICD-10-CM | POA: Diagnosis not present

## 2023-07-08 DIAGNOSIS — D631 Anemia in chronic kidney disease: Secondary | ICD-10-CM | POA: Diagnosis not present

## 2023-07-08 DIAGNOSIS — D509 Iron deficiency anemia, unspecified: Secondary | ICD-10-CM | POA: Diagnosis not present

## 2023-07-08 DIAGNOSIS — R197 Diarrhea, unspecified: Secondary | ICD-10-CM | POA: Diagnosis not present

## 2023-07-08 DIAGNOSIS — N186 End stage renal disease: Secondary | ICD-10-CM | POA: Diagnosis not present

## 2023-07-08 DIAGNOSIS — T8249XA Other complication of vascular dialysis catheter, initial encounter: Secondary | ICD-10-CM | POA: Diagnosis not present

## 2023-07-08 DIAGNOSIS — N2581 Secondary hyperparathyroidism of renal origin: Secondary | ICD-10-CM | POA: Diagnosis not present

## 2023-07-08 DIAGNOSIS — Z992 Dependence on renal dialysis: Secondary | ICD-10-CM | POA: Diagnosis not present

## 2023-07-08 DIAGNOSIS — D689 Coagulation defect, unspecified: Secondary | ICD-10-CM | POA: Diagnosis not present

## 2023-07-10 DIAGNOSIS — D689 Coagulation defect, unspecified: Secondary | ICD-10-CM | POA: Diagnosis not present

## 2023-07-10 DIAGNOSIS — T8249XA Other complication of vascular dialysis catheter, initial encounter: Secondary | ICD-10-CM | POA: Diagnosis not present

## 2023-07-10 DIAGNOSIS — N2581 Secondary hyperparathyroidism of renal origin: Secondary | ICD-10-CM | POA: Diagnosis not present

## 2023-07-10 DIAGNOSIS — Z992 Dependence on renal dialysis: Secondary | ICD-10-CM | POA: Diagnosis not present

## 2023-07-10 DIAGNOSIS — D509 Iron deficiency anemia, unspecified: Secondary | ICD-10-CM | POA: Diagnosis not present

## 2023-07-10 DIAGNOSIS — D631 Anemia in chronic kidney disease: Secondary | ICD-10-CM | POA: Diagnosis not present

## 2023-07-10 DIAGNOSIS — R197 Diarrhea, unspecified: Secondary | ICD-10-CM | POA: Diagnosis not present

## 2023-07-10 DIAGNOSIS — N186 End stage renal disease: Secondary | ICD-10-CM | POA: Diagnosis not present

## 2023-07-13 DIAGNOSIS — R197 Diarrhea, unspecified: Secondary | ICD-10-CM | POA: Diagnosis not present

## 2023-07-13 DIAGNOSIS — D689 Coagulation defect, unspecified: Secondary | ICD-10-CM | POA: Diagnosis not present

## 2023-07-13 DIAGNOSIS — D631 Anemia in chronic kidney disease: Secondary | ICD-10-CM | POA: Diagnosis not present

## 2023-07-13 DIAGNOSIS — N186 End stage renal disease: Secondary | ICD-10-CM | POA: Diagnosis not present

## 2023-07-13 DIAGNOSIS — Z992 Dependence on renal dialysis: Secondary | ICD-10-CM | POA: Diagnosis not present

## 2023-07-13 DIAGNOSIS — T8249XA Other complication of vascular dialysis catheter, initial encounter: Secondary | ICD-10-CM | POA: Diagnosis not present

## 2023-07-13 DIAGNOSIS — N2581 Secondary hyperparathyroidism of renal origin: Secondary | ICD-10-CM | POA: Diagnosis not present

## 2023-07-13 DIAGNOSIS — D509 Iron deficiency anemia, unspecified: Secondary | ICD-10-CM | POA: Diagnosis not present

## 2023-07-14 ENCOUNTER — Encounter: Payer: Self-pay | Admitting: *Deleted

## 2023-07-15 DIAGNOSIS — D631 Anemia in chronic kidney disease: Secondary | ICD-10-CM | POA: Diagnosis not present

## 2023-07-15 DIAGNOSIS — D509 Iron deficiency anemia, unspecified: Secondary | ICD-10-CM | POA: Diagnosis not present

## 2023-07-15 DIAGNOSIS — N2581 Secondary hyperparathyroidism of renal origin: Secondary | ICD-10-CM | POA: Diagnosis not present

## 2023-07-15 DIAGNOSIS — T8249XA Other complication of vascular dialysis catheter, initial encounter: Secondary | ICD-10-CM | POA: Diagnosis not present

## 2023-07-15 DIAGNOSIS — R197 Diarrhea, unspecified: Secondary | ICD-10-CM | POA: Diagnosis not present

## 2023-07-15 DIAGNOSIS — N186 End stage renal disease: Secondary | ICD-10-CM | POA: Diagnosis not present

## 2023-07-15 DIAGNOSIS — D689 Coagulation defect, unspecified: Secondary | ICD-10-CM | POA: Diagnosis not present

## 2023-07-15 DIAGNOSIS — Z992 Dependence on renal dialysis: Secondary | ICD-10-CM | POA: Diagnosis not present

## 2023-07-17 DIAGNOSIS — T8249XA Other complication of vascular dialysis catheter, initial encounter: Secondary | ICD-10-CM | POA: Diagnosis not present

## 2023-07-17 DIAGNOSIS — D689 Coagulation defect, unspecified: Secondary | ICD-10-CM | POA: Diagnosis not present

## 2023-07-17 DIAGNOSIS — N2581 Secondary hyperparathyroidism of renal origin: Secondary | ICD-10-CM | POA: Diagnosis not present

## 2023-07-17 DIAGNOSIS — Z992 Dependence on renal dialysis: Secondary | ICD-10-CM | POA: Diagnosis not present

## 2023-07-17 DIAGNOSIS — N186 End stage renal disease: Secondary | ICD-10-CM | POA: Diagnosis not present

## 2023-07-17 DIAGNOSIS — D631 Anemia in chronic kidney disease: Secondary | ICD-10-CM | POA: Diagnosis not present

## 2023-07-17 DIAGNOSIS — D509 Iron deficiency anemia, unspecified: Secondary | ICD-10-CM | POA: Diagnosis not present

## 2023-07-17 DIAGNOSIS — R197 Diarrhea, unspecified: Secondary | ICD-10-CM | POA: Diagnosis not present

## 2023-07-20 DIAGNOSIS — N2581 Secondary hyperparathyroidism of renal origin: Secondary | ICD-10-CM | POA: Diagnosis not present

## 2023-07-20 DIAGNOSIS — D689 Coagulation defect, unspecified: Secondary | ICD-10-CM | POA: Diagnosis not present

## 2023-07-20 DIAGNOSIS — Z992 Dependence on renal dialysis: Secondary | ICD-10-CM | POA: Diagnosis not present

## 2023-07-20 DIAGNOSIS — R197 Diarrhea, unspecified: Secondary | ICD-10-CM | POA: Diagnosis not present

## 2023-07-20 DIAGNOSIS — T8249XA Other complication of vascular dialysis catheter, initial encounter: Secondary | ICD-10-CM | POA: Diagnosis not present

## 2023-07-20 DIAGNOSIS — D631 Anemia in chronic kidney disease: Secondary | ICD-10-CM | POA: Diagnosis not present

## 2023-07-20 DIAGNOSIS — D509 Iron deficiency anemia, unspecified: Secondary | ICD-10-CM | POA: Diagnosis not present

## 2023-07-20 DIAGNOSIS — N186 End stage renal disease: Secondary | ICD-10-CM | POA: Diagnosis not present

## 2023-07-22 DIAGNOSIS — T8249XA Other complication of vascular dialysis catheter, initial encounter: Secondary | ICD-10-CM | POA: Diagnosis not present

## 2023-07-22 DIAGNOSIS — D509 Iron deficiency anemia, unspecified: Secondary | ICD-10-CM | POA: Diagnosis not present

## 2023-07-22 DIAGNOSIS — N186 End stage renal disease: Secondary | ICD-10-CM | POA: Diagnosis not present

## 2023-07-22 DIAGNOSIS — R197 Diarrhea, unspecified: Secondary | ICD-10-CM | POA: Diagnosis not present

## 2023-07-22 DIAGNOSIS — D689 Coagulation defect, unspecified: Secondary | ICD-10-CM | POA: Diagnosis not present

## 2023-07-22 DIAGNOSIS — N2581 Secondary hyperparathyroidism of renal origin: Secondary | ICD-10-CM | POA: Diagnosis not present

## 2023-07-22 DIAGNOSIS — Z992 Dependence on renal dialysis: Secondary | ICD-10-CM | POA: Diagnosis not present

## 2023-07-22 DIAGNOSIS — D631 Anemia in chronic kidney disease: Secondary | ICD-10-CM | POA: Diagnosis not present

## 2023-07-24 DIAGNOSIS — N2581 Secondary hyperparathyroidism of renal origin: Secondary | ICD-10-CM | POA: Diagnosis not present

## 2023-07-24 DIAGNOSIS — D689 Coagulation defect, unspecified: Secondary | ICD-10-CM | POA: Diagnosis not present

## 2023-07-24 DIAGNOSIS — D509 Iron deficiency anemia, unspecified: Secondary | ICD-10-CM | POA: Diagnosis not present

## 2023-07-24 DIAGNOSIS — R197 Diarrhea, unspecified: Secondary | ICD-10-CM | POA: Diagnosis not present

## 2023-07-24 DIAGNOSIS — Z992 Dependence on renal dialysis: Secondary | ICD-10-CM | POA: Diagnosis not present

## 2023-07-24 DIAGNOSIS — D631 Anemia in chronic kidney disease: Secondary | ICD-10-CM | POA: Diagnosis not present

## 2023-07-24 DIAGNOSIS — T8249XA Other complication of vascular dialysis catheter, initial encounter: Secondary | ICD-10-CM | POA: Diagnosis not present

## 2023-07-24 DIAGNOSIS — N186 End stage renal disease: Secondary | ICD-10-CM | POA: Diagnosis not present

## 2023-07-27 DIAGNOSIS — D689 Coagulation defect, unspecified: Secondary | ICD-10-CM | POA: Diagnosis not present

## 2023-07-27 DIAGNOSIS — D509 Iron deficiency anemia, unspecified: Secondary | ICD-10-CM | POA: Diagnosis not present

## 2023-07-27 DIAGNOSIS — D631 Anemia in chronic kidney disease: Secondary | ICD-10-CM | POA: Diagnosis not present

## 2023-07-27 DIAGNOSIS — Z992 Dependence on renal dialysis: Secondary | ICD-10-CM | POA: Diagnosis not present

## 2023-07-27 DIAGNOSIS — N186 End stage renal disease: Secondary | ICD-10-CM | POA: Diagnosis not present

## 2023-07-27 DIAGNOSIS — N2581 Secondary hyperparathyroidism of renal origin: Secondary | ICD-10-CM | POA: Diagnosis not present

## 2023-07-27 DIAGNOSIS — R197 Diarrhea, unspecified: Secondary | ICD-10-CM | POA: Diagnosis not present

## 2023-07-27 DIAGNOSIS — T8249XA Other complication of vascular dialysis catheter, initial encounter: Secondary | ICD-10-CM | POA: Diagnosis not present

## 2023-07-29 DIAGNOSIS — D689 Coagulation defect, unspecified: Secondary | ICD-10-CM | POA: Diagnosis not present

## 2023-07-29 DIAGNOSIS — N2581 Secondary hyperparathyroidism of renal origin: Secondary | ICD-10-CM | POA: Diagnosis not present

## 2023-07-29 DIAGNOSIS — R197 Diarrhea, unspecified: Secondary | ICD-10-CM | POA: Diagnosis not present

## 2023-07-29 DIAGNOSIS — D509 Iron deficiency anemia, unspecified: Secondary | ICD-10-CM | POA: Diagnosis not present

## 2023-07-29 DIAGNOSIS — Z992 Dependence on renal dialysis: Secondary | ICD-10-CM | POA: Diagnosis not present

## 2023-07-29 DIAGNOSIS — N186 End stage renal disease: Secondary | ICD-10-CM | POA: Diagnosis not present

## 2023-07-29 DIAGNOSIS — T8249XA Other complication of vascular dialysis catheter, initial encounter: Secondary | ICD-10-CM | POA: Diagnosis not present

## 2023-07-29 DIAGNOSIS — D631 Anemia in chronic kidney disease: Secondary | ICD-10-CM | POA: Diagnosis not present

## 2023-07-31 DIAGNOSIS — N186 End stage renal disease: Secondary | ICD-10-CM | POA: Diagnosis not present

## 2023-07-31 DIAGNOSIS — Z992 Dependence on renal dialysis: Secondary | ICD-10-CM | POA: Diagnosis not present

## 2023-07-31 DIAGNOSIS — D509 Iron deficiency anemia, unspecified: Secondary | ICD-10-CM | POA: Diagnosis not present

## 2023-07-31 DIAGNOSIS — D689 Coagulation defect, unspecified: Secondary | ICD-10-CM | POA: Diagnosis not present

## 2023-07-31 DIAGNOSIS — T8249XA Other complication of vascular dialysis catheter, initial encounter: Secondary | ICD-10-CM | POA: Diagnosis not present

## 2023-07-31 DIAGNOSIS — R197 Diarrhea, unspecified: Secondary | ICD-10-CM | POA: Diagnosis not present

## 2023-07-31 DIAGNOSIS — D631 Anemia in chronic kidney disease: Secondary | ICD-10-CM | POA: Diagnosis not present

## 2023-07-31 DIAGNOSIS — N2581 Secondary hyperparathyroidism of renal origin: Secondary | ICD-10-CM | POA: Diagnosis not present

## 2023-08-03 DIAGNOSIS — D509 Iron deficiency anemia, unspecified: Secondary | ICD-10-CM | POA: Diagnosis not present

## 2023-08-03 DIAGNOSIS — Z992 Dependence on renal dialysis: Secondary | ICD-10-CM | POA: Diagnosis not present

## 2023-08-03 DIAGNOSIS — R197 Diarrhea, unspecified: Secondary | ICD-10-CM | POA: Diagnosis not present

## 2023-08-03 DIAGNOSIS — I12 Hypertensive chronic kidney disease with 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: Secondary | ICD-10-CM | POA: Diagnosis not present

## 2023-08-03 DIAGNOSIS — N186 End stage renal disease: Secondary | ICD-10-CM | POA: Diagnosis not present

## 2023-08-03 DIAGNOSIS — T8249XA Other complication of vascular dialysis catheter, initial encounter: Secondary | ICD-10-CM | POA: Diagnosis not present

## 2023-08-03 DIAGNOSIS — D631 Anemia in chronic kidney disease: Secondary | ICD-10-CM | POA: Diagnosis not present

## 2023-08-03 DIAGNOSIS — N2581 Secondary hyperparathyroidism of renal origin: Secondary | ICD-10-CM | POA: Diagnosis not present

## 2023-08-03 DIAGNOSIS — D689 Coagulation defect, unspecified: Secondary | ICD-10-CM | POA: Diagnosis not present

## 2023-08-05 DIAGNOSIS — T8249XA Other complication of vascular dialysis catheter, initial encounter: Secondary | ICD-10-CM | POA: Diagnosis not present

## 2023-08-05 DIAGNOSIS — D631 Anemia in chronic kidney disease: Secondary | ICD-10-CM | POA: Diagnosis not present

## 2023-08-05 DIAGNOSIS — D509 Iron deficiency anemia, unspecified: Secondary | ICD-10-CM | POA: Diagnosis not present

## 2023-08-05 DIAGNOSIS — Z992 Dependence on renal dialysis: Secondary | ICD-10-CM | POA: Diagnosis not present

## 2023-08-05 DIAGNOSIS — D689 Coagulation defect, unspecified: Secondary | ICD-10-CM | POA: Diagnosis not present

## 2023-08-05 DIAGNOSIS — N2581 Secondary hyperparathyroidism of renal origin: Secondary | ICD-10-CM | POA: Diagnosis not present

## 2023-08-05 DIAGNOSIS — N186 End stage renal disease: Secondary | ICD-10-CM | POA: Diagnosis not present

## 2023-08-10 DIAGNOSIS — D631 Anemia in chronic kidney disease: Secondary | ICD-10-CM | POA: Diagnosis not present

## 2023-08-10 DIAGNOSIS — D689 Coagulation defect, unspecified: Secondary | ICD-10-CM | POA: Diagnosis not present

## 2023-08-10 DIAGNOSIS — N186 End stage renal disease: Secondary | ICD-10-CM | POA: Diagnosis not present

## 2023-08-10 DIAGNOSIS — T8249XA Other complication of vascular dialysis catheter, initial encounter: Secondary | ICD-10-CM | POA: Diagnosis not present

## 2023-08-10 DIAGNOSIS — N2581 Secondary hyperparathyroidism of renal origin: Secondary | ICD-10-CM | POA: Diagnosis not present

## 2023-08-10 DIAGNOSIS — D509 Iron deficiency anemia, unspecified: Secondary | ICD-10-CM | POA: Diagnosis not present

## 2023-08-10 DIAGNOSIS — Z992 Dependence on renal dialysis: Secondary | ICD-10-CM | POA: Diagnosis not present

## 2023-08-12 DIAGNOSIS — D631 Anemia in chronic kidney disease: Secondary | ICD-10-CM | POA: Diagnosis not present

## 2023-08-12 DIAGNOSIS — D509 Iron deficiency anemia, unspecified: Secondary | ICD-10-CM | POA: Diagnosis not present

## 2023-08-12 DIAGNOSIS — T8249XA Other complication of vascular dialysis catheter, initial encounter: Secondary | ICD-10-CM | POA: Diagnosis not present

## 2023-08-12 DIAGNOSIS — N186 End stage renal disease: Secondary | ICD-10-CM | POA: Diagnosis not present

## 2023-08-12 DIAGNOSIS — D689 Coagulation defect, unspecified: Secondary | ICD-10-CM | POA: Diagnosis not present

## 2023-08-12 DIAGNOSIS — Z992 Dependence on renal dialysis: Secondary | ICD-10-CM | POA: Diagnosis not present

## 2023-08-12 DIAGNOSIS — N2581 Secondary hyperparathyroidism of renal origin: Secondary | ICD-10-CM | POA: Diagnosis not present

## 2023-08-14 DIAGNOSIS — D509 Iron deficiency anemia, unspecified: Secondary | ICD-10-CM | POA: Diagnosis not present

## 2023-08-14 DIAGNOSIS — D631 Anemia in chronic kidney disease: Secondary | ICD-10-CM | POA: Diagnosis not present

## 2023-08-14 DIAGNOSIS — N186 End stage renal disease: Secondary | ICD-10-CM | POA: Diagnosis not present

## 2023-08-14 DIAGNOSIS — Z992 Dependence on renal dialysis: Secondary | ICD-10-CM | POA: Diagnosis not present

## 2023-08-14 DIAGNOSIS — T8249XA Other complication of vascular dialysis catheter, initial encounter: Secondary | ICD-10-CM | POA: Diagnosis not present

## 2023-08-14 DIAGNOSIS — D689 Coagulation defect, unspecified: Secondary | ICD-10-CM | POA: Diagnosis not present

## 2023-08-14 DIAGNOSIS — N2581 Secondary hyperparathyroidism of renal origin: Secondary | ICD-10-CM | POA: Diagnosis not present

## 2023-08-17 DIAGNOSIS — D631 Anemia in chronic kidney disease: Secondary | ICD-10-CM | POA: Diagnosis not present

## 2023-08-17 DIAGNOSIS — T8249XA Other complication of vascular dialysis catheter, initial encounter: Secondary | ICD-10-CM | POA: Diagnosis not present

## 2023-08-17 DIAGNOSIS — D509 Iron deficiency anemia, unspecified: Secondary | ICD-10-CM | POA: Diagnosis not present

## 2023-08-17 DIAGNOSIS — N2581 Secondary hyperparathyroidism of renal origin: Secondary | ICD-10-CM | POA: Diagnosis not present

## 2023-08-17 DIAGNOSIS — D689 Coagulation defect, unspecified: Secondary | ICD-10-CM | POA: Diagnosis not present

## 2023-08-17 DIAGNOSIS — N186 End stage renal disease: Secondary | ICD-10-CM | POA: Diagnosis not present

## 2023-08-17 DIAGNOSIS — Z992 Dependence on renal dialysis: Secondary | ICD-10-CM | POA: Diagnosis not present

## 2023-08-18 ENCOUNTER — Ambulatory Visit (INDEPENDENT_AMBULATORY_CARE_PROVIDER_SITE_OTHER): Admitting: Family Medicine

## 2023-08-18 ENCOUNTER — Other Ambulatory Visit: Payer: Self-pay

## 2023-08-18 ENCOUNTER — Emergency Department (HOSPITAL_COMMUNITY)
Admission: EM | Admit: 2023-08-18 | Discharge: 2023-08-19 | Discharge status: Against Medical Advice | Attending: Emergency Medicine | Admitting: Emergency Medicine

## 2023-08-18 ENCOUNTER — Emergency Department (HOSPITAL_COMMUNITY)

## 2023-08-18 ENCOUNTER — Ambulatory Visit (HOSPITAL_COMMUNITY)
Admission: RE | Admit: 2023-08-18 | Discharge: 2023-08-18 | Disposition: A | Discharge status: Home / Self Care | Source: Ambulatory Visit | Attending: Family Medicine | Admitting: Family Medicine

## 2023-08-18 ENCOUNTER — Encounter (HOSPITAL_COMMUNITY): Payer: Self-pay

## 2023-08-18 VITALS — BP 162/86 | HR 45 | Ht 65.0 in | Wt 202.1 lb

## 2023-08-18 DIAGNOSIS — R0602 Shortness of breath: Secondary | ICD-10-CM | POA: Diagnosis not present

## 2023-08-18 DIAGNOSIS — R0609 Other forms of dyspnea: Secondary | ICD-10-CM | POA: Diagnosis not present

## 2023-08-18 DIAGNOSIS — R0600 Dyspnea, unspecified: Secondary | ICD-10-CM | POA: Diagnosis not present

## 2023-08-18 DIAGNOSIS — Z5321 Procedure and treatment not carried out due to patient leaving prior to being seen by health care provider: Secondary | ICD-10-CM | POA: Insufficient documentation

## 2023-08-18 DIAGNOSIS — Z4682 Encounter for fitting and adjustment of non-vascular catheter: Secondary | ICD-10-CM | POA: Diagnosis not present

## 2023-08-18 DIAGNOSIS — I700 Atherosclerosis of aorta: Secondary | ICD-10-CM | POA: Diagnosis not present

## 2023-08-18 DIAGNOSIS — R918 Other nonspecific abnormal finding of lung field: Secondary | ICD-10-CM | POA: Diagnosis not present

## 2023-08-18 LAB — I-STAT CHEM 8, ED
BUN: 30 mg/dL — ABNORMAL HIGH (ref 6–20)
Calcium, Ion: 1.14 mmol/L — ABNORMAL LOW (ref 1.15–1.40)
Chloride: 100 mmol/L (ref 98–111)
Creatinine, Ser: 10.1 mg/dL — ABNORMAL HIGH (ref 0.61–1.24)
Glucose, Bld: 85 mg/dL (ref 70–99)
HCT: 32 % — ABNORMAL LOW (ref 39.0–52.0)
Hemoglobin: 10.9 g/dL — ABNORMAL LOW (ref 13.0–17.0)
Potassium: 4.3 mmol/L (ref 3.5–5.1)
Sodium: 137 mmol/L (ref 135–145)
TCO2: 30 mmol/L (ref 22–32)

## 2023-08-18 LAB — CBC WITH DIFFERENTIAL/PLATELET
Abs Immature Granulocytes: 0.01 K/uL (ref 0.00–0.07)
Basophils Absolute: 0 K/uL (ref 0.0–0.1)
Basophils Relative: 0 %
Eosinophils Absolute: 0.3 K/uL (ref 0.0–0.5)
Eosinophils Relative: 6 %
HCT: 31.7 % — ABNORMAL LOW (ref 39.0–52.0)
Hemoglobin: 10 g/dL — ABNORMAL LOW (ref 13.0–17.0)
Immature Granulocytes: 0 %
Lymphocytes Relative: 20 %
Lymphs Abs: 0.9 K/uL (ref 0.7–4.0)
MCH: 34 pg (ref 26.0–34.0)
MCHC: 31.5 g/dL (ref 30.0–36.0)
MCV: 107.8 fL — ABNORMAL HIGH (ref 80.0–100.0)
Monocytes Absolute: 0.3 K/uL (ref 0.1–1.0)
Monocytes Relative: 7 %
Neutro Abs: 3.2 K/uL (ref 1.7–7.7)
Neutrophils Relative %: 67 %
Platelets: 173 K/uL (ref 150–400)
RBC: 2.94 MIL/uL — ABNORMAL LOW (ref 4.22–5.81)
RDW: 17 % — ABNORMAL HIGH (ref 11.5–15.5)
WBC: 4.7 K/uL (ref 4.0–10.5)
nRBC: 0 % (ref 0.0–0.2)

## 2023-08-18 LAB — COMPREHENSIVE METABOLIC PANEL WITH GFR
ALT: 10 U/L (ref 0–44)
AST: 18 U/L (ref 15–41)
Albumin: 3.6 g/dL (ref 3.5–5.0)
Alkaline Phosphatase: 66 U/L (ref 38–126)
Anion gap: 16 — ABNORMAL HIGH (ref 5–15)
BUN: 28 mg/dL — ABNORMAL HIGH (ref 6–20)
CO2: 26 mmol/L (ref 22–32)
Calcium: 10.8 mg/dL — ABNORMAL HIGH (ref 8.9–10.3)
Chloride: 98 mmol/L (ref 98–111)
Creatinine, Ser: 9.52 mg/dL — ABNORMAL HIGH (ref 0.61–1.24)
GFR, Estimated: 6 mL/min — ABNORMAL LOW (ref >60)
Glucose, Bld: 86 mg/dL (ref 70–99)
Potassium: 4.4 mmol/L (ref 3.5–5.1)
Sodium: 140 mmol/L (ref 135–145)
Total Bilirubin: 1.1 mg/dL (ref 0.0–1.2)
Total Protein: 7.1 g/dL (ref 6.5–8.1)

## 2023-08-18 LAB — TROPONIN I (HIGH SENSITIVITY)
Troponin I (High Sensitivity): 31 ng/L — ABNORMAL HIGH (ref <18)
Troponin I (High Sensitivity): 34 ng/L — ABNORMAL HIGH (ref <18)

## 2023-08-18 LAB — BRAIN NATRIURETIC PEPTIDE: B Natriuretic Peptide: 3524.7 pg/mL — ABNORMAL HIGH (ref 0.0–100.0)

## 2023-08-18 MED ORDER — ASPIRIN 325 MG PO TABS
325.0000 mg | ORAL_TABLET | Freq: Once | ORAL | Status: AC
Start: 2023-08-18 — End: 2023-08-18
  Administered 2023-08-18: 325 mg via ORAL

## 2023-08-18 NOTE — Patient Instructions (Signed)
 It was wonderful to see you today!  I am sending you to the ED today due to concern for ACS, or a possible heart attack. Please follow up in our office when you are feeling better.   Please call [phone] with any questions about today's appointment.   If you need any additional refills, please call your pharmacy before calling the office.  Lucie Pinal, DO Family Medicine

## 2023-08-18 NOTE — ED Notes (Signed)
 PT brought back to triage to have 2nd troponin collected. Pt AxOx4. Still reports c/o sob. Denies cp.

## 2023-08-18 NOTE — Progress Notes (Signed)
    SUBJECTIVE:   CHIEF COMPLAINT / HPI:   Patient with two weeks of progressively worsening DOE. He was previously able to walk about two blocks without issue, is now finding it difficult to stand long enough to cook without becoming short of breath. He is waking up at night coughing and is not sleeping well because of it. 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 He keeps his dialysis schedule but usually leaves an hour before the end of his sessions. No CP, fevers, chills.   PERTINENT  PMH / PSH: above plus prior CVA  OBJECTIVE:   BP (!) 162/86   Pulse (!) 45   Ht 5' 5 (1.651 m)   Wt 202 lb 2 oz (91.7 kg)   SpO2 96%   BMI 33.64 kg/m   General: A&O, NAD Cardiac: RRR, no m/r/g Respiratory: Bibasilar fine crackles, normal WOB, no w/c/r  Extremities: NTTP, no peripheral edema. Neuro: Difficulty standing from a sitting position, no evidence of weakness on strength testing individually in the BLE.  ASSESSMENT/PLAN:   Assessment & Plan Exertional dyspnea -EKG in office today which showed- twave inversions in V4-V6 sugessting infero-lateral -amb pulse ox testing with  - aspirin  325mg  given in office today -sent via EMS to ED for concern for ACS   Lucie Pinal, DO [facility]

## 2023-08-18 NOTE — Progress Notes (Signed)
 95% O2  50bpm

## 2023-08-18 NOTE — ED Provider Triage Note (Signed)
 Emergency Medicine Provider Triage Evaluation Note  Kanishk Stroebel , a 58 y.o. male  was evaluated in triage.  Pt complains of shortness of breath.  Patient reports that he has had gradually increasing shortness of breath times several weeks.  Patient was seen by his PCP this morning and referred to the ED for further evaluation.  Patient received full dialysis session yesterday.  Review of Systems  Positive: Dyspnea Negative: Fever, pain  Physical Exam  BP (!) 171/99   Pulse (!) 50   Temp (!) 97.5 F (36.4 C) (Oral)   Resp 16   Ht 5' 5 (1.651 m)   Wt 90.7 kg   SpO2 98%   BMI 33.28 kg/m  Gen:   Awake, no distress   Resp:  Normal effort  MSK:   Moves extremities without difficulty  Other:    Medical Decision Making  Medically screening exam initiated at 12:47 PM.  Appropriate orders placed.  [name] was informed that the remainder of the evaluation will be completed by another provider, this initial triage assessment does not replace that evaluation, and the importance of remaining in the ED until their evaluation is complete.  Patient understands the MSE process and need to remain in the ED until completion of evaluation and treatment.   Laurice Maude BROCKS, MD 08/18/23 1248

## 2023-08-18 NOTE — ED Triage Notes (Signed)
 Pt here for [facility] for a couple of weeks. Doctor told pt he has had EKG changes and a reduced EF. EKG clear with EMS. Last full dialysis tx was yesterday. VSS.

## 2023-08-19 ENCOUNTER — Telehealth: Payer: Self-pay

## 2023-08-19 DIAGNOSIS — D689 Coagulation defect, unspecified: Secondary | ICD-10-CM | POA: Diagnosis not present

## 2023-08-19 DIAGNOSIS — T8249XA Other complication of vascular dialysis catheter, initial encounter: Secondary | ICD-10-CM | POA: Diagnosis not present

## 2023-08-19 DIAGNOSIS — D631 Anemia in chronic kidney disease: Secondary | ICD-10-CM | POA: Diagnosis not present

## 2023-08-19 DIAGNOSIS — Z992 Dependence on renal dialysis: Secondary | ICD-10-CM | POA: Diagnosis not present

## 2023-08-19 DIAGNOSIS — N186 End stage renal disease: Secondary | ICD-10-CM | POA: Diagnosis not present

## 2023-08-19 DIAGNOSIS — D509 Iron deficiency anemia, unspecified: Secondary | ICD-10-CM | POA: Diagnosis not present

## 2023-08-19 DIAGNOSIS — N2581 Secondary hyperparathyroidism of renal origin: Secondary | ICD-10-CM | POA: Diagnosis not present

## 2023-08-19 NOTE — ED Notes (Signed)
 LWBS. Patient educated on safety precaution.

## 2023-08-19 NOTE — Telephone Encounter (Signed)
 Patient calls nurse line in regards to hospital stay.   He reports he would like to discuss any labs with Dr. Cleotilde. He reports he tried to wait for a provider, however he had to leave due to dialysis.   He reports he was told he would be given a prescription for portable oxygen at visit yesterday. However, he states he never received one.   Advised the process and will forward to provider who saw patient for concern.   Of note, typically to qualify for DME oxygen the patient must have documented SpO2 of 88%.

## 2023-08-20 ENCOUNTER — Other Ambulatory Visit: Payer: Self-pay

## 2023-08-20 ENCOUNTER — Observation Stay (HOSPITAL_COMMUNITY)
Admission: EM | Admit: 2023-08-20 | Discharge: 2023-08-22 | Disposition: A | Discharge status: Home / Self Care | Attending: Neurosurgery | Admitting: Neurosurgery

## 2023-08-20 ENCOUNTER — Encounter (HOSPITAL_COMMUNITY): Payer: Self-pay

## 2023-08-20 ENCOUNTER — Emergency Department (HOSPITAL_COMMUNITY)

## 2023-08-20 ENCOUNTER — Telehealth: Payer: Self-pay | Admitting: Family Medicine

## 2023-08-20 DIAGNOSIS — I509 Heart failure, unspecified: Secondary | ICD-10-CM | POA: Diagnosis not present

## 2023-08-20 DIAGNOSIS — R0602 Shortness of breath: Secondary | ICD-10-CM | POA: Diagnosis not present

## 2023-08-20 DIAGNOSIS — Z8673 Personal history of transient ischemic attack (TIA), and cerebral infarction without residual deficits: Secondary | ICD-10-CM | POA: Insufficient documentation

## 2023-08-20 DIAGNOSIS — I132 Hypertensive heart and chronic kidney disease with heart failure and with stage 5 chronic kidney disease, or end stage renal disease: Secondary | ICD-10-CM | POA: Diagnosis not present

## 2023-08-20 DIAGNOSIS — Z87891 Personal history of nicotine dependence: Secondary | ICD-10-CM | POA: Diagnosis not present

## 2023-08-20 DIAGNOSIS — J449 Chronic obstructive pulmonary disease, unspecified: Secondary | ICD-10-CM | POA: Diagnosis not present

## 2023-08-20 DIAGNOSIS — Z992 Dependence on renal dialysis: Secondary | ICD-10-CM | POA: Diagnosis not present

## 2023-08-20 DIAGNOSIS — R0600 Dyspnea, unspecified: Principal | ICD-10-CM | POA: Diagnosis present

## 2023-08-20 DIAGNOSIS — I700 Atherosclerosis of aorta: Secondary | ICD-10-CM | POA: Diagnosis not present

## 2023-08-20 DIAGNOSIS — N186 End stage renal disease: Secondary | ICD-10-CM | POA: Diagnosis present

## 2023-08-20 DIAGNOSIS — Z8546 Personal history of malignant neoplasm of prostate: Secondary | ICD-10-CM | POA: Diagnosis not present

## 2023-08-20 DIAGNOSIS — G8929 Other chronic pain: Secondary | ICD-10-CM | POA: Diagnosis not present

## 2023-08-20 DIAGNOSIS — I12 Hypertensive chronic kidney disease with 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: Secondary | ICD-10-CM | POA: Diagnosis not present

## 2023-08-20 DIAGNOSIS — J9811 Atelectasis: Secondary | ICD-10-CM | POA: Diagnosis not present

## 2023-08-20 DIAGNOSIS — Z789 Other specified health status: Secondary | ICD-10-CM

## 2023-08-20 DIAGNOSIS — R001 Bradycardia, unspecified: Secondary | ICD-10-CM | POA: Diagnosis not present

## 2023-08-20 LAB — COMPREHENSIVE METABOLIC PANEL WITH GFR
ALT: 10 U/L (ref 0–44)
AST: 15 U/L (ref 15–41)
Albumin: 3.5 g/dL (ref 3.5–5.0)
Alkaline Phosphatase: 59 U/L (ref 38–126)
Anion gap: 13 (ref 5–15)
BUN: 22 mg/dL — ABNORMAL HIGH (ref 6–20)
CO2: 28 mmol/L (ref 22–32)
Calcium: 10.5 mg/dL — ABNORMAL HIGH (ref 8.9–10.3)
Chloride: 97 mmol/L — ABNORMAL LOW (ref 98–111)
Creatinine, Ser: 8.19 mg/dL — ABNORMAL HIGH (ref 0.61–1.24)
GFR, Estimated: 7 mL/min — ABNORMAL LOW (ref >60)
Glucose, Bld: 87 mg/dL (ref 70–99)
Potassium: 4 mmol/L (ref 3.5–5.1)
Sodium: 138 mmol/L (ref 135–145)
Total Bilirubin: 1 mg/dL (ref 0.0–1.2)
Total Protein: 6.9 g/dL (ref 6.5–8.1)

## 2023-08-20 LAB — CBC WITH DIFFERENTIAL/PLATELET
Abs Immature Granulocytes: 0.02 K/uL (ref 0.00–0.07)
Basophils Absolute: 0 K/uL (ref 0.0–0.1)
Basophils Relative: 1 %
Eosinophils Absolute: 0.3 K/uL (ref 0.0–0.5)
Eosinophils Relative: 7 %
HCT: 27.9 % — ABNORMAL LOW (ref 39.0–52.0)
Hemoglobin: 9.1 g/dL — ABNORMAL LOW (ref 13.0–17.0)
Immature Granulocytes: 1 %
Lymphocytes Relative: 24 %
Lymphs Abs: 1 K/uL (ref 0.7–4.0)
MCH: 34.7 pg — ABNORMAL HIGH (ref 26.0–34.0)
MCHC: 32.6 g/dL (ref 30.0–36.0)
MCV: 106.5 fL — ABNORMAL HIGH (ref 80.0–100.0)
Monocytes Absolute: 0.4 K/uL (ref 0.1–1.0)
Monocytes Relative: 8 %
Neutro Abs: 2.5 K/uL (ref 1.7–7.7)
Neutrophils Relative %: 59 %
Platelets: 140 K/uL — ABNORMAL LOW (ref 150–400)
RBC: 2.62 MIL/uL — ABNORMAL LOW (ref 4.22–5.81)
RDW: 16.3 % — ABNORMAL HIGH (ref 11.5–15.5)
WBC: 4.3 K/uL (ref 4.0–10.5)
nRBC: 0 % (ref 0.0–0.2)

## 2023-08-20 LAB — BRAIN NATRIURETIC PEPTIDE: B Natriuretic Peptide: 3638.8 pg/mL — ABNORMAL HIGH (ref 0.0–100.0)

## 2023-08-20 LAB — TROPONIN I (HIGH SENSITIVITY)
Troponin I (High Sensitivity): 32 ng/L — ABNORMAL HIGH (ref <18)
Troponin I (High Sensitivity): 32 ng/L — ABNORMAL HIGH (ref <18)

## 2023-08-20 MED ORDER — HYDRALAZINE HCL 50 MG PO TABS
50.0000 mg | ORAL_TABLET | Freq: Two times a day (BID) | ORAL | Status: DC
Start: 2023-08-20 — End: 2023-08-22
  Administered 2023-08-20 – 2023-08-22 (×4): 50 mg via ORAL
  Filled 2023-08-20 (×4): qty 1

## 2023-08-20 MED ORDER — ISOSORBIDE DINITRATE 10 MG PO TABS
20.0000 mg | ORAL_TABLET | Freq: Two times a day (BID) | ORAL | Status: DC
  Administered 2023-08-20 – 2023-08-22 (×4): 20 mg via ORAL
  Filled 2023-08-20 (×4): qty 2

## 2023-08-20 MED ORDER — HYDROXYZINE HCL 25 MG PO TABS
25.0000 mg | ORAL_TABLET | Freq: Every day | ORAL | Status: DC
  Administered 2023-08-21 – 2023-08-22 (×2): 25 mg via ORAL
  Filled 2023-08-20 (×2): qty 1

## 2023-08-20 MED ORDER — ACETAMINOPHEN 650 MG RE SUPP: 650.0000 mg | Freq: Four times a day (QID) | RECTAL | Status: DC | PRN

## 2023-08-20 MED ORDER — ALBUTEROL SULFATE (2.5 MG/3ML) 0.083% IN NEBU: 2.5000 mg | INHALATION_SOLUTION | RESPIRATORY_TRACT | Status: DC | PRN

## 2023-08-20 MED ORDER — ACETAMINOPHEN 325 MG PO TABS: 650.0000 mg | ORAL_TABLET | Freq: Four times a day (QID) | ORAL | Status: DC | PRN

## 2023-08-20 MED ORDER — ACETAMINOPHEN 650 MG RE SUPP: 650.0000 mg | Freq: Three times a day (TID) | RECTAL | Status: DC | PRN

## 2023-08-20 MED ORDER — CLONIDINE HCL 0.3 MG/24HR TD PTWK: 0.3000 mg | MEDICATED_PATCH | TRANSDERMAL | Status: DC

## 2023-08-20 MED ORDER — PANTOPRAZOLE SODIUM 20 MG PO TBEC
20.0000 mg | DELAYED_RELEASE_TABLET | Freq: Every day | ORAL | Status: DC
  Administered 2023-08-21 – 2023-08-22 (×2): 20 mg via ORAL
  Filled 2023-08-20 (×2): qty 1

## 2023-08-20 MED ORDER — ACETAMINOPHEN 325 MG PO TABS: 650.0000 mg | ORAL_TABLET | Freq: Three times a day (TID) | ORAL | Status: DC | PRN

## 2023-08-20 MED ORDER — AMLODIPINE BESYLATE 10 MG PO TABS
10.0000 mg | ORAL_TABLET | Freq: Every day | ORAL | Status: DC
  Administered 2023-08-20 – 2023-08-21 (×2): 10 mg via ORAL
  Filled 2023-08-20: qty 2
  Filled 2023-08-20: qty 1

## 2023-08-20 MED ORDER — HEPARIN SODIUM (PORCINE) 5000 UNIT/ML IJ SOLN
5000.0000 [IU] | Freq: Three times a day (TID) | INTRAMUSCULAR | Status: DC
  Administered 2023-08-20 – 2023-08-22 (×5): 5000 [IU] via SUBCUTANEOUS
  Filled 2023-08-20 (×5): qty 1

## 2023-08-20 MED ORDER — ROSUVASTATIN CALCIUM 5 MG PO TABS
10.0000 mg | ORAL_TABLET | Freq: Every day | ORAL | Status: DC
  Administered 2023-08-21 – 2023-08-22 (×2): 10 mg via ORAL
  Filled 2023-08-20 (×2): qty 2

## 2023-08-20 MED ORDER — SODIUM CHLORIDE 0.9% FLUSH
3.0000 mL | Freq: Two times a day (BID) | INTRAVENOUS | Status: DC
  Administered 2023-08-21 – 2023-08-22 (×3): 3 mL via INTRAVENOUS

## 2023-08-20 NOTE — Plan of Care (Signed)
 FMTS Interim Progress Note  S:Discussed patient with Dr. Mannie in the ED. Patient is stable for night team admission. Patient expresses worsening dyspnea on exertion over the past 2 weeks.  No swelling.  Has not missed dialysis.  O: BP (!) 157/80   Pulse (!) 49   Temp 98 F (36.7 C)   Resp 16   Ht 5' 5 (1.651 m)   Wt 91.6 kg   SpO2 99%   BMI 33.61 kg/m   General: NAD, lying comfortably in hospital bed Neuro: A&O Cardiovascular: RRR, no murmurs, no peripheral edema Respiratory: normal WOB on RA, CTAB, no wheezes, ronchi or rales Extremities: Moving all 4 extremities equally   A/P: Will admit for exertional dyspnea workup.  Vital signs stable on room air. Night team will see patient for admission.  Alba Sharper, MD 08/20/2023, 7:37 PM PGY-3, [facility] Family Medicine Service pager [phone]

## 2023-08-20 NOTE — ED Notes (Signed)
 CCMD callled

## 2023-08-20 NOTE — ED Triage Notes (Signed)
 Was here on Wednesday and left due to long wait. But stopped smoking two weeks ago and has been sob of breath since. PCP sent him here for more testing.

## 2023-08-20 NOTE — H&P (Cosign Needed Addendum)
 Hospital Admission History and Physical Service Pager: [phone]  Patient name: James Wall [facility] Medical record number: [id number] Date of Birth: [birth year] Age: 58 y.o. Gender: male  Primary Care Provider: Janna Ferrier, DO Consultants: Nephrology Code Status: Full code Preferred Emergency Contact:  Contact Information     Name Relation Home Work Mobile   [place] Friend [phone]  [phone]   Lera [name] Ahumada [phone]  [phone]      Other Contacts   None on File      Chief Complaint: Dyspnea with exertion  Differential and Medical Decision Making:  Arjun Hard is a 58 y.o. male presenting with dyspnea on exertion.   Differential for this patient's presentation of this includes: - CHF exacerbation: Most likely in setting of dyspnea with exertion, presence of JVD on exam. - Fluid overload in setting of ESRD on dialysis: Patient denies missing dialysis days, but does report he ends his sessions early due to cramping. Last dialysis Wed 7/16 - ACS: Less likely given stable ECG, flat troponins. Current EKG with T wave inversions in V4-V6 unchanged from 7/15 ECG and from prior workup in late 2024. - COPD exacerbation: Less likely given no increased sputum production, no wheeze heard on exam.  Of note, patient was previously hospitalized in 12/2017 for dyspnea and hypertensive urgency needing urgent dialysis. He was seen by cardiology at this time, who recommended right and left heart catheterization; patient refused this. He had sporadic follow-up with cardiology since then, with last outpatient visit in 10/2021 (was scheduled for 01/2023, but patient left before being seen).   Assessment & Plan Dyspnea Secondary to hypervolemia due to CHF vs ESRD contribution, as discussed above. Clinically stable with normal WOB on RA, no indication for urgent dialysis. - Admitted to FMTS, Med Tele level of care, attending Dr. Laymon Legions - Telemetry  for 24 hours given bradycardia - No indication for diuresis as patient does not make urine - Echocardiogram - Labs: AM RFP, Mag, CBC - PT/OT eval and treat in setting of new dyspnea on exertion CHF (congestive heart failure) (HCC) EF 60-65% via last echo done 10/2022. Concern for worsening EF given current symptoms. - Echocardiogram - Holding metoprolol  tartrate 100 mg twice daily - Consider cards consult in AM given above prior hospitalization recs in setting of new dyspnea, continued bradycardia ESRD (end stage renal disease) (HCC) Dialysis MWF outpatient; per patient, often elects to end dialysis early given cramping. - Nephro consulted re: need for dialysis tomorrow 7/18 Bradycardia HR in high 40s, low 50s; patient asymptomatic. Prior monitor worn 11/2022 with nonsustained SVT, HR range of 49-210 with average of 75. - Holding metoprolol  tartrate as above - Possible cards consult in AM as above Chronic health problem HTN: Home amlodipine  10 mg daily at bedtime, clonidine  0.3 mg patch weekly, hydralazine  50 mg twice daily, isosorbide  dinitrate 20 mg twice daily HLD: Home Crestor  10 mg daily GERD: Home pantoprazole  20 mg daily COPD: Low suspicion for exacerbation; albuterol  prn ordered Itching: hydroxyzine  25 mg daily OSA: Patient not currently on CPAP, denied wanting this during admission Tobacco Use: Recently quit 2 weeks ago; Consider TOC consult for further support with tobacco cessation (if patient desires)   FEN/GI: Renal diet with fluid restriction 1200 mL VTE Prophylaxis: Heparin   Disposition: Med tele  History of Present Illness:  James Wall is a 58 y.o. male with PMHx significant for CHF, ESRD on dialysis, COPD, cirrhosis, CVA, HLD, HTN, GERD, OSA presenting with dyspnea on exertion.  Dyspneic over past two weeks, has been getting worse. Did quit smoking 2 weeks ago. Worse when he is trying to do an activity. Issues with coughing up clear mucus when laying flat;  no significant increase in this/change in mucus color. Uses 2 pillows to sleep at night. Denies chest pain and palpitations. Denies fevers. Some swelling in legs, noted about a week ago they were tight.  Has been to all his dialysis sessions but has not been staying for the whole treatment. Feels like he is going to die if he completes the treatment. Has significant cramping with treatments. Does not urinate at baseline, occasionally will drip urine.  In the ED, patient had flat troponins at his baseline, BNP elevated but not moreso than it was 7/15, CXR without significant pulmonary edema.  Review Of Systems: Per HPI  Pertinent Past Medical History: ESRD on HD MWF Prostate cancer CHF (LVEF 60-65% via last echo 10/2022) COPD Cirrhosis HLD Resistant HTN OSA, does not use CPAP CVA 2023 MDD GERD Remainder reviewed in history tab.   Pertinent Past Surgical History: Fistula placement Heart cath in 2015 Remainder reviewed in history tab.   Pertinent Social History: Tobacco use: Former, quit 2 weeks ago. 28 year history. 1 pack every 2 days Alcohol use: None since 01/2023. Drank more in his youth Other Substance use: Marijuana twice per week Lives alone  Pertinent Family History: Brother - CHF Remainder reviewed in history tab.   Important Outpatient Medications: *Took AM meds  Amlodipine  10mg  daily Clonidine  patch 0.3mg /24 hour Hydralazine  50mg  BID Hydroxyzine  25mg  daily Isodil 20mg  BID Metoprolol  100mg  BID Pantoprazole  20mg  daily Rosuvastatin  10mg  daily  Remainder reviewed in medication history.   Objective: BP (!) 157/80   Pulse (!) 49   Temp 98.1 F (36.7 C) (Oral)   Resp 16   Ht 5' 5 (1.651 m)   Wt 91.6 kg   SpO2 99%   BMI 33.61 kg/m  Exam: General: Pt lying back in bed with head of bed slightly elevated. No acute distress. Eyes: Sclera non icteric. EOMI bilaterally. Neck: Supple Cardiovascular: Bradycardic with regular rhythm. No murmurs, rubs,  gallops. Mild JVD present. Respiratory: Normal work of breathing on room air. Clear to auscultation bilaterally; no wheezes, crackles. Gastrointestinal: Bowel sounds present and normoactive bilaterally. Soft, nondistended, nontender. MSK: No gross abnormalities of extremities; moves all 4 extremities without issue Derm: Skin warm, dry. 1+ pitting edema of left lower extremity, no pitting edema of right lower extremity. Neuro: Alert and oriented to person, place, time, event. Appropriate response to questions. Psych: Appropriate affect.  Labs:  CBC BMET  Recent Labs  Lab 08/20/23 1221  WBC 4.3  HGB 9.1*  HCT 27.9*  PLT 140*   Recent Labs  Lab 08/20/23 1221  NA 138  K 4.0  CL 97*  CO2 28  BUN 22*  CREATININE 8.19*  GLUCOSE 87  CALCIUM  10.5*     Trop: 32>32 BNP 3638.8   EKG: My own interpretation (not copied from electronic read): Sinus brady, T wave inversions in V4-V6 unchanged from prior ECG 7/15  Imaging Studies Performed:  CXR 7/17: Impression from Radiologist: Minimal bibasilar subsegmental atelectasis. Aortic Atherosclerosis (ICD10-I70.0).  My Interpretation: Haziness present throughout lungs, similar to 7/15 CXR   Larraine Palma, MD 08/20/2023, 9:36 PM PGY-1, [facility] Family Medicine  FPTS Intern pager: [phone], text pages welcome Secure chat group [facility] Teaching Service   Upper Level Addendum:   I have seen and evaluated this patient along  with Dr. Larraine and reviewed the above note, making necessary revisions as appropriate.  I agree with the medical decision making and physical exam as noted above.   Izetta Nap, DO PGY-3, [facility] Family Medicine Residency

## 2023-08-20 NOTE — Assessment & Plan Note (Signed)
 HTN: Home amlodipine  10 mg daily at bedtime, clonidine  0.3 mg patch weekly, hydralazine  50 mg twice daily, isosorbide  dinitrate 20 mg twice daily HLD: Home Crestor  10 mg daily GERD: Home pantoprazole  20 mg daily COPD: Low suspicion for exacerbation; albuterol  prn ordered Itching: hydroxyzine  25 mg daily OSA: Patient not currently on CPAP, denied wanting this during admission Tobacco Use: Recently quit 2 weeks ago; Consider TOC consult for further support with tobacco cessation (if patient desires)

## 2023-08-20 NOTE — Telephone Encounter (Signed)
 SABRA

## 2023-08-20 NOTE — Telephone Encounter (Signed)
 It has to be authorized through [place] first.

## 2023-08-20 NOTE — Assessment & Plan Note (Signed)
 Dialysis MWF outpatient; per patient, often elects to end dialysis early given cramping. - Nephro consulted re: need for dialysis tomorrow 7/18

## 2023-08-20 NOTE — Telephone Encounter (Signed)
 This is the message I got when I messaged the ECHO scheduler.  If they truly want it stat they need to go to the ED.  Next available is at [facility]. on 7/28 at 12:50.  Please advise, Margit

## 2023-08-20 NOTE — Assessment & Plan Note (Addendum)
 EF 60-65% via last echo done 10/2022. Concern for worsening EF given current symptoms. - Echocardiogram - Holding metoprolol  tartrate 100 mg twice daily - Consider cards consult in AM given above prior hospitalization recs in setting of new dyspnea, continued bradycardia

## 2023-08-20 NOTE — Assessment & Plan Note (Addendum)
 Secondary to hypervolemia due to CHF vs ESRD contribution, as discussed above. Clinically stable with normal WOB on RA, no indication for urgent dialysis. - Admitted to FMTS, Med Tele level of care, attending Dr. Laymon Legions - Telemetry for 24 hours given bradycardia - No indication for diuresis as patient does not make urine - Echocardiogram - Labs: AM RFP, Mag, CBC - PT/OT eval and treat in setting of new dyspnea on exertion

## 2023-08-20 NOTE — Telephone Encounter (Signed)
 Dialysis MWF- please schedule T/Th  Called patient to follow up on recent trip to the ED due to ischemic changes on his EKG in office, paired with worsening DOE. Patient ended up leaving the ED after waiting for over 12 hours without being seen. Reviewed labs done in ED which showed elevated BNP to 3,524.7, Troponins mildly elevated to 31>34. Expressed to patient that it was my medical opinion he should return to the ED for urgent ECHO. Patient adamantly against returning to ED unless he is having active heart attack symptoms. After long risk and benefit discussion, patient will make appointment with his cardiologist and has agreed to stat outpatient Echo. Will forward to blue team for scheduling.

## 2023-08-20 NOTE — ED Provider Triage Note (Signed)
 Emergency Medicine Provider Triage Evaluation Note  Prajwal Fellner , a 58 y.o. male  was evaluated in triage.  Pt complains of sob. Endorse increased sob x 2 weeks after he quit smoking cold [place].  Denies fever, chills, productive cough, cp, abd pain or increased leg swelling.  Does do dialysis, last dialyzed yesterday.    Review of Systems  Positive: As above Negative: As above  Physical Exam  BP (!) 153/77 (BP Location: Right Arm)   Pulse (!) 51   Temp 98.7 F (37.1 C)   Resp 20   Ht 5' 5 (1.651 m)   Wt 91.6 kg   SpO2 93%   BMI 33.61 kg/m  Gen:   Awake, no distress   Resp:  Normal effort  MSK:   Moves extremities without difficulty  Other:    Medical Decision Making  Medically screening exam initiated at 12:34 PM.  Appropriate orders placed.  [name] was informed that the remainder of the evaluation will be completed by another provider, this initial triage assessment does not replace that evaluation, and the importance of remaining in the ED until their evaluation is complete.     Nivia Colon, PA-C 08/20/23 1235

## 2023-08-20 NOTE — Hospital Course (Addendum)
 James Wall is a 58 y.o. year old with a history of CHF, ESRD on dialysis, COPD, cirrhosis, CVA, HLD, HTN, GERD, OSA  who presented with dyspnea on exertion and was admitted to the [facility] Medicine Teaching Service for CHF exacerbation.  Dyspnea on exertion CHF Bradycardia Presented presented with 2 weeks of worsening dyspnea on exertion.  Given cardiac history and ESRD concern for new reduction in EF.  Echo was ordered.  Patient was also noted to have heart rates in the 40s with potential heart block seen on telemetry. Cardiology consulted for symptomatic bradycardia and heart failure, they recommended continuing antihypertensive therapy and continued smoking cessation.  Patient's metoprolol  was reduced to 75 mg twice daily.  Echo was significant for stable EF at 60 to 65% with consistent findings of diastolic dysfunction.  Volume was managed by dialysis as below.  Patient remained stable on room air throughout admission.   ESRD on Dialysis Patient continued on MWF dialysis schedule while inpatient.    Other chronic conditions were medically managed with home medications and formulary alternatives as necessary (HTN, HLD, GERD, COPD).  PCP Follow-up Recommendations: Follow-up tobacco cessation 2.   Cardiology recommends follow-up outpatient in 4-6 weeks  3.   F/u possible need for zio patch w/ OP cardiology. Patient had prior zio in Oct 24.

## 2023-08-20 NOTE — Assessment & Plan Note (Signed)
 HR in high 40s, low 50s; patient asymptomatic. Prior monitor worn 11/2022 with nonsustained SVT, HR range of 49-210 with average of 75. - Holding metoprolol  tartrate as above - Possible cards consult in AM as above

## 2023-08-20 NOTE — ED Provider Notes (Signed)
 [place] EMERGENCY DEPARTMENT AT [facility] Provider Note  CSN: [id number] Arrival date & time: 08/20/23 1122  Chief Complaint(s) Shortness of Breath  HPI James Wall is a 58 y.o. male with history of Monday Wednesday Friday dialysis, here today at the encouragement of his PCP for dyspnea and dyspnea on exertion.  Patient reportedly quit smoking 2 weeks ago.  Has had an increase in his cough.  He reports that he has been having increasing shortness of breath with exertion.  Patient had labs drawn in the ER waiting room yesterday, however left prior to being seen due to a long wait times.  His PCP contacted him today and recommended he come back to the ER for an echocardiogram.   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 [facility])    primary cancer is prostate   Anemia associated with chronic renal failure    Anxiety    Arthritis    CHF (congestive heart failure) (HCC)    Cirrhosis, nonalcoholic (HCC)    per pt possible from a medication , unsure ;   last ultrasound 08-09-2020 in epic no fibrosis   COPD (chronic obstructive pulmonary disease) (HCC)    Depression    ESRD on hemodialysis (HCC) 2009   Started HD Jan 2009;  ESRD secondary to hypertensive nephrosclerosis;  dialysis at [place] [facility] on MWF   First degree heart block    GERD (gastroesophageal reflux disease)    Hiatal hernia    History of acute respiratory failure 07/2013   admission;  HCAP w/ ARF with hypoxia   History of adenomatous polyp of colon    History of ascites    s/p paracentesis 01-31-2013 (5L) and last one 03-28-2013 (2.7L)   History of community acquired pneumonia 08/08/2020   admission ; LLL , POA   History of heart murmur in childhood    History of MRSA infection 12/19/2012   hospital admission;  w/ sepsis MRSA bacteramia AVF infection   History of sepsis 03/2017   admission;    HCAP w/ sepsis   Hyperlipidemia    Hypertension    Hypertensive heart disease    cardiologist--- dr emerson leavens;  nuclear stress test 06-16-2013 intermediate risk w/ mid-distal anterior wall ischemia , not gated;  cardiac cath 07-13-2013 in epic showed normal coronaries and LVSF,  ef not assessed, LCEDP   Hypertensive nephrosclerosis, 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)    Intolerance to cold    due to anemia   Malignant neoplasm prostate [facility])    urologist--- dr bell/  radiation onologist--- dr patrcia;  dx 01/ 2023,  Gleason 4+3, PSA 32   Nausea and vomiting 10/19/2022   NICM (nonischemic cardiomyopathy) (HCC)    followed by cardiology;   last echo in epic 09-13-2020 ef 55-60%   OSA (obstructive sleep apnea) 2009   no  longer using cpap since the yr started 2009;   sleep study in epic 05-11-2007 severe osa   Pneumonia    PSVT (paroxysmal supraventricular tachycardia) (HCC)    event monitor 08-01-2019  SR w/ SVT runs , rare PAC/ PVC   Secondary hyperparathyroidism (HCC)    Stroke (HCC)    2023 weakness Lhand and Left leg   Wears glasses    Patient Active Problem List   Diagnosis Date Noted   Constipation 11/06/2022   Lactic acidosis 10/21/2022  COVID 10/21/2022   Diarrhea 10/21/2022   Nausea and vomiting 10/19/2022   Abnormal finding on CT scan 10/19/2022   COVID-19 10/19/2022   Hypertensive retinopathy of both eyes 04/10/2022   Chronic heart failure with preserved ejection fraction (HCC) 10/22/2021   Hospital discharge follow-up 10/22/2021   CVA (cerebral vascular accident) (HCC) 09/15/2021   Elevated prostate specific antigen (PSA) 06/12/2021   Malignant neoplasm of prostate (HCC) 03/21/2021   Hypertensive heart disease with heart failure (HCC) 11/01/2020   Microcytic anemia 11/01/2020   Chronic obstructive pulmonary disease (HCC) 08/28/2020   Tobacco abuse 08/28/2020   Dyspnea on exertion 09/22/2019   Hypercalcemia 09/14/2019    Tubular adenoma of colon 12/13/2018   Situational insomnia 09/24/2017   Generalized anxiety disorder 09/17/2017   Bloodstream infection due to central venous catheter, initial encounter 09/16/2017   BPH (benign prostatic hyperplasia) 09/12/2017   Bacteremia associated with intravascular line (HCC) 09/11/2017   Cirrhosis (HCC)    Complication of AV dialysis fistula 10/22/2015   ESRD on dialysis (HCC) 10/22/2015   Hyperkalemia 10/22/2015   Dependence on renal dialysis (HCC) 11/28/2014   Coagulation defect, unspecified (HCC) 02/01/2014   Mild protein-calorie malnutrition (HCC) 08/03/2013   Abnormal cardiovascular function study 07/12/2013   Abnormal stress test 06/22/2013   Hypothyroidism, unspecified 06/03/2013   Palpitations 05/31/2013   GERD (gastroesophageal reflux disease) 12/21/2012   Anemia in neoplastic disease 12/21/2012   ASTHMA 04/06/2008   SLEEP APNEA 04/06/2008   Secondary hyperparathyroidism of renal origin (HCC) 03/05/2007   Patient's noncompliance with other medical treatment and regimen 03/05/2007   Personal history of nicotine  dependence 03/05/2007   Hypertensive chronic kidney disease with 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) 02/18/2007   Home Medication(s) Prior to Admission medications   Medication Sig Start Date End Date Taking? Authorizing Provider  acetaminophen  (TYLENOL ) 650 MG CR tablet Take 650 mg by mouth every 8 (eight) hours as needed for pain. Pt takes 2 tablet as needed.    [provider]  amLODipine  (NORVASC ) 10 MG tablet Take 1 tablet (10 mg total) by mouth at bedtime. 12/05/21   Santo Stanly LABOR, MD  cloNIDine  (CATAPRES  - DOSED IN MG/24 HR) 0.3 mg/24hr patch Place 1 patch (0.3 mg total) onto the skin once a week on Sunday. 01/30/22   Chandrasekhar, Stanly LABOR, MD  hydrALAZINE  (APRESOLINE ) 50 MG tablet Take 1 tablet (50 mg total) by mouth 2 (two) times daily. 12/05/21   Chandrasekhar, Stanly LABOR, MD  hydrOXYzine   (ATARAX ) 25 MG tablet Take 25 mg by mouth daily.    [provider]  isosorbide  dinitrate (ISORDIL ) 20 MG tablet Take 1 tablet (20 mg total) by mouth 2 (two) times daily. 12/05/21   Santo Stanly LABOR, MD  latanoprost  (XALATAN ) 0.005 % ophthalmic solution Apply to eye. 04/14/22   [provider]  metoprolol  tartrate (LOPRESSOR ) 100 MG tablet Take 1 tablet (100 mg total) by mouth 2 (two) times daily. 12/05/21   Santo Stanly LABOR, MD  pantoprazole  (PROTONIX ) 20 MG tablet TAKE 1 TABLET(20 MG) BY MOUTH DAILY 01/09/22   [name], DO  rosuvastatin  (CRESTOR ) 10 MG tablet Take 1 tablet (10 mg total) by mouth daily. 12/05/21   Santo Stanly LABOR, MD  senna (SENOKOT) 8.6 MG TABS tablet Take 1 tablet (8.6 mg total) by mouth daily. 11/06/22   Everhart, Kirstie, DO  SENSIPAR  90 MG tablet Take 90 mg by mouth daily. 05/07/22   [provider]  sevelamer  carbonate (RENVELA ) 800 MG  tablet Take 4 tablets (3,200 mg total) by mouth 3 (three) times daily with meals. 09/30/21   Angiulli, Toribio PARAS, PA-C  tamsulosin  (FLOMAX ) 0.4 MG CAPS capsule TAKE 2 CAPSULES(0.8 MG) BY MOUTH DAILY Patient taking differently: Take 0.4 mg by mouth daily. 04/10/22   [name], DO                                                                                                                                    Past Surgical History Past Surgical History:  Procedure Laterality Date   AV FISTULA PLACEMENT Right 02/22/2013   Procedure:  CREATION  OF BRACHIAL CEPHALIC FISTULA RIGHT ARM;  Surgeon: Carlin FORBES Haddock, MD;  Location: [facility] OR;  Service: Vascular;  Laterality: Right;   AV FISTULA PLACEMENT Left 08/10/2014   Procedure: BASILIC VEIN TRANSPOSITION  ARTERIOVENOUS (AV) FISTULA CREATION LEFT UPPER ARM;  Surgeon: Lynwood JONETTA Collum, MD;  Location: [facility] OR;  Service: Vascular;  Laterality: Left;   AV FISTULA PLACEMENT, RADIOCEPHALIC Left 02/22/2007   @[facility] ;  Left lower arm   COLONOSCOPY  11/30/2018   by dr  beavers   CYSTOSCOPY W/ RETROGRADES Bilateral 06/26/2022   Procedure: CYSTOSCOPY WITH BILATERAL RETROGRADE PYELOGRAM;  Surgeon: Carolee Sherwood JONETTA DOUGLAS, MD;  Location: [facility] ORS;  Service: Urology;  Laterality: Bilateral;   ESOPHAGOGASTRODUODENOSCOPY (EGD) WITH PROPOFOL  N/A 04/12/2013   Procedure: ESOPHAGOGASTRODUODENOSCOPY (EGD) WITH PROPOFOL ;  Surgeon: Elsie Cree, MD;  Location: [facility] ENDOSCOPY;  Service: Endoscopy;  Laterality: N/A;   EXCHANGE OF A DIALYSIS CATHETER Right 09/24/2021   Procedure: EXCHANGE OF A DIALYSIS CATHETER;  Surgeon: Eliza Lonni RAMAN, MD;  Location: [facility] OR;  Service: Vascular;  Laterality: Right;   GOLD SEED IMPLANT N/A 06/11/2021   Procedure: GOLD SEED IMPLANT;  Surgeon: Selma Donnice SAUNDERS, MD;  Location: [facility] ORS;  Service: Urology;  Laterality: N/A;   INSERTION OF DIALYSIS CATHETER N/A 12/23/2012   Procedure: INSERTION OF DIALYSIS CATHETER; ULTRASOUND GUIDED;  Surgeon: Lonni RAMAN Eliza, MD;  Location: [facility] OR;  Service: Vascular;  Laterality: N/A;   INSERTION OF DIALYSIS CATHETER  10/22/2015   Right IJ non-tunneled HD catheter, placed again in 2019   IR FLUORO GUIDE CV LINE RIGHT  08/18/2019   IR FLUORO GUIDE CV LINE RIGHT  10/07/2019   IR GENERIC HISTORICAL  10/22/2015   IR US  GUIDE VASC ACCESS RIGHT 10/22/2015 [facility]INTER[facility] RAD   IR GENERIC HISTORICAL  10/22/2015   IR FLUORO GUIDE CV LINE RIGHT 10/22/2015 [facility]   IR GENERIC HISTORICAL  10/23/2015   IR FLUORO GUIDE CV LINE RIGHT 10/23/2015 Rome Hall, MD [facility]INTER[facility]   LEFT HEART CATHETERIZATION WITH CORONARY ANGIOGRAM N/A 07/13/2013   Procedure: LEFT HEART CATHETERIZATION WITH CORONARY ANGIOGRAM;  Surgeon: Candyce RAMAN Reek, MD;  Location: [facility] CATH LAB;  Service: Cardiovascular;  Laterality: N/A;   LIGATION OF ARTERIOVENOUS  FISTULA Left 12/22/2012   Procedure: LIGATION OF ARTERIOVENOUS  FISTULA;EXCISION OF LARGE ANEURYSMS;;  Surgeon: Carlin FORBES Haddock, MD;  Location: [facility] OR;  Service: Vascular;  Laterality: Left;    SPACE OAR INSTILLATION N/A 06/11/2021   Procedure: SPACE OAR INSTILLATION;  Surgeon: Selma Donnice SAUNDERS, MD;  Location: [facility] ORS;  Service: Urology;  Laterality: N/A;   UPPER GASTROINTESTINAL ENDOSCOPY  09/07/2018   by dr eda   Family History Family History  Problem Relation Age of Onset   Hypertension Mother    Cerebrovascular Accident Father    Hypertension Father    Congestive Heart Failure Brother    Asthma Brother    Stomach cancer Neg Hx    Rectal cancer Neg Hx    Esophageal cancer Neg Hx    Colon cancer Neg Hx     Social History Social History   Tobacco Use   Smoking status: Every Day    Current packs/day: 0.50    Average packs/day: 0.5 packs/day for 17.0 years (8.5 ttl pk-yrs)    Types: Cigarettes   Smokeless tobacco: Never   Tobacco comments:    06-06-2021  per pt had quit smoking 06/ 2021 for 16 yrs , but restarted approx 10/ 2022 1pp 3-4 days  Vaping Use   Vaping status: Never Used  Substance Use Topics   Alcohol use: Yes    Comment: Holidays has one drink   Drug use: Yes    Frequency: 4.0 times per week    Types: Marijuana    Comment: 06-06-2021 per pt smokes 2-4 weekly, (half of joint blunt)   Allergies Venofer   [ferric oxide] and Aspirin   Review of Systems Review of Systems  Physical Exam Vital Signs  I have reviewed the triage vital signs BP (!) 157/80   Pulse (!) 49   Temp 98.1 F (36.7 C) (Oral)   Resp 16   Ht 5' 5 (1.651 m)   Wt 91.6 kg   SpO2 99%   BMI 33.61 kg/m   Physical Exam Vitals and nursing note reviewed.  Cardiovascular:     Rate and Rhythm: Normal rate.  Pulmonary:     Effort: Pulmonary effort is normal.     Breath sounds: No decreased breath sounds or wheezing.  Chest:     Chest wall: No mass.  Abdominal:     Palpations: Abdomen is soft.  Musculoskeletal:        General: Normal range of motion.  Skin:    General: Skin is warm and dry.  Neurological:     General: No focal deficit present.     Mental Status: He is  alert.     ED Results and Treatments Labs (all labs ordered are listed, but only abnormal results are displayed) Labs Reviewed  COMPREHENSIVE METABOLIC PANEL WITH GFR - Abnormal; Notable for the following components:      Result Value   Chloride 97 (*)    BUN 22 (*)    Creatinine, Ser 8.19 (*)    Calcium  10.5 (*)    GFR, Estimated 7 (*)    All other components within normal limits  CBC WITH DIFFERENTIAL/PLATELET - Abnormal; Notable for the following components:   RBC 2.62 (*)    Hemoglobin 9.1 (*)    HCT 27.9 (*)    MCV 106.5 (*)    MCH 34.7 (*)    RDW 16.3 (*)    Platelets 140 (*)    All other components within normal limits  BRAIN NATRIURETIC PEPTIDE - Abnormal; Notable for the following components:   B Natriuretic Peptide 6,361.1 (*)  All other components within normal limits  TROPONIN I (HIGH SENSITIVITY) - Abnormal; Notable for the following components:   Troponin I (High Sensitivity) 32 (*)    All other components within normal limits  TROPONIN I (HIGH SENSITIVITY) - Abnormal; Notable for the following components:   Troponin I (High Sensitivity) 32 (*)    All other components within normal limits                                                                                                                          Radiology DG Chest 2 View Result Date: 08/20/2023 CLINICAL DATA:  Dyspnea. EXAM: CHEST - 2 VIEW COMPARISON:  August 18, 2023. FINDINGS: Stable cardiomediastinal silhouette. Right internal jugular dialysis catheter is again noted. Minimal bibasilar subsegmental atelectasis is noted. Bony thorax is unremarkable. IMPRESSION: Minimal bibasilar subsegmental atelectasis. Aortic Atherosclerosis (ICD10-I70.0). Electronically Signed   By: Lynwood Landy Raddle M.D.   On: 08/20/2023 14:37    Pertinent labs & imaging results that were available during my care of the patient were reviewed by me and considered in my medical decision making (see MDM for details).  Medications  Ordered in ED Medications - No data to display                                                                                                                                   Procedures Procedures  (including critical care time)  Medical Decision Making / ED Course   This patient presents to the ED for concern of cough and dyspnea, this involves an extensive number of treatment options, and is a complaint that carries with it a high risk of complications and morbidity.  The differential diagnosis includes smoking cessation cough, increased mucus production, less likely ACS, less likely pulmonary edema, less likely PE.  MDM: Reviewing the patient's labs, his troponin is at his baseline.  BNP is elevated, this is expected in a patient with ESRD.  Chest x-ray, per my independent review, shows no significant pulmonary edema.  Patient without any infectious signs.  Patient's EKG has scattered T wave inversions which are consistent through January of this year in October of this past year, no acute changes.  I have reached out to the family medicine teaching service to see if they want to admit this patient for additional workup or if they would like to continue on an outpatient workup for  this patient.  Reassessment 8:40 PM-family medicine service admitting patient to their service.  Additional history obtained: -Additional history obtained from family medicine team -External records from outside source obtained and reviewed including: Chart review including previous notes, labs, imaging, consultation notes   Lab Tests: -I ordered, reviewed, and interpreted labs.   The pertinent results include:   Labs Reviewed  COMPREHENSIVE METABOLIC PANEL WITH GFR - Abnormal; Notable for the following components:      Result Value   Chloride 97 (*)    BUN 22 (*)    Creatinine, Ser 8.19 (*)    Calcium  10.5 (*)    GFR, Estimated 7 (*)    All other components within normal limits  CBC WITH  DIFFERENTIAL/PLATELET - Abnormal; Notable for the following components:   RBC 2.62 (*)    Hemoglobin 9.1 (*)    HCT 27.9 (*)    MCV 106.5 (*)    MCH 34.7 (*)    RDW 16.3 (*)    Platelets 140 (*)    All other components within normal limits  BRAIN NATRIURETIC PEPTIDE - Abnormal; Notable for the following components:   B Natriuretic Peptide 3,638.8 (*)    All other components within normal limits  TROPONIN I (HIGH SENSITIVITY) - Abnormal; Notable for the following components:   Troponin I (High Sensitivity) 32 (*)    All other components within normal limits  TROPONIN I (HIGH SENSITIVITY) - Abnormal; Notable for the following components:   Troponin I (High Sensitivity) 32 (*)    All other components within normal limits      EKG no acute changes from prior EKGs, diffuse T wave inversions.  EKG Interpretation Date/Time:  Thursday August 20 2023 12:25:23 EDT Ventricular Rate:  50 PR Interval:  186 QRS Duration:  96 QT Interval:  460 QTC Calculation: 419 R Axis:   -33  Text Interpretation: Sinus bradycardia Left axis deviation T wave abnormality, consider inferolateral ischemia Abnormal ECG When compared with ECG of 18-Aug-2023 13:23, PREVIOUS ECG IS PRESENT Confirmed by Mannie Pac [phone]) on 08/20/2023 5:13:56 PM         Imaging Studies ordered: I ordered imaging studies including chest x-ray I independently visualized and interpreted imaging. I agree with the radiologist interpretation   Medicines ordered and prescription drug management: No orders of the defined types were placed in this encounter.   -I have reviewed the patients home medicines and have made adjustments as needed   Cardiac Monitoring: The patient was maintained on a cardiac monitor.  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 [facility])    primary cancer is prostate   Anemia associated with chronic renal failure    Anxiety    Arthritis    CHF (congestive heart failure) (HCC)    Cirrhosis, nonalcoholic (HCC)    per pt possible from a medication , unsure ;   last ultrasound 08-09-2020 in epic no fibrosis   COPD (chronic obstructive pulmonary disease) (HCC)    Depression    ESRD on hemodialysis (HCC) 2009   Started HD Jan 2009;  ESRD secondary to hypertensive  nephrosclerosis;  dialysis at [facility] on MWF   First degree heart block    GERD (gastroesophageal reflux disease)    Hiatal hernia    History of acute respiratory failure 07/2013   admission;  HCAP w/ ARF with hypoxia   History of adenomatous polyp of colon    History of ascites    s/p paracentesis 01-31-2013 (5L) and last one 03-28-2013 (2.7L)   History of community acquired pneumonia 08/08/2020   admission ; LLL , POA   History of heart murmur in childhood    History of MRSA infection 12/19/2012   hospital admission;  w/ sepsis MRSA bacteramia AVF infection   History of sepsis 03/2017   admission;   HCAP w/ sepsis   Hyperlipidemia    Hypertension    Hypertensive heart disease    cardiologist--- dr emerson leavens;  nuclear stress test 06-16-2013 intermediate risk w/ mid-distal anterior wall ischemia , not gated;  cardiac cath 07-13-2013 in epic showed normal coronaries and LVSF,  ef not assessed, LCEDP   Hypertensive nephrosclerosis, 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)    Intolerance to cold    due to anemia   Malignant neoplasm prostate [facility])    urologist--- dr bell/  radiation  onologist--- dr patrcia;  dx 01/ 2023,  Gleason 4+3, PSA 32   Nausea and vomiting 10/19/2022   NICM (nonischemic cardiomyopathy) (HCC)    followed by cardiology;   last echo in epic 09-13-2020 ef 55-60%   OSA (obstructive sleep apnea) 2009   no  longer using cpap since the yr started 2009;   sleep study in epic 05-11-2007 severe osa   Pneumonia    PSVT (paroxysmal supraventricular tachycardia) (HCC)    event monitor 08-01-2019  SR w/ SVT runs , rare PAC/ PVC   Secondary hyperparathyroidism (HCC)    Stroke (HCC)    2023 weakness Lhand and Left leg   Wears glasses       Final Clinical Impression(s) / ED Diagnoses Final diagnoses:  SOB (shortness of breath)     @PCDICTATION @    Mannie Pac T, DO 08/20/23 2046

## 2023-08-21 ENCOUNTER — Observation Stay (HOSPITAL_COMMUNITY)

## 2023-08-21 DIAGNOSIS — R001 Bradycardia, unspecified: Secondary | ICD-10-CM | POA: Diagnosis not present

## 2023-08-21 DIAGNOSIS — R0609 Other forms of dyspnea: Secondary | ICD-10-CM

## 2023-08-21 DIAGNOSIS — I509 Heart failure, unspecified: Secondary | ICD-10-CM

## 2023-08-21 DIAGNOSIS — R0600 Dyspnea, unspecified: Secondary | ICD-10-CM | POA: Diagnosis not present

## 2023-08-21 DIAGNOSIS — I10 Essential (primary) hypertension: Secondary | ICD-10-CM

## 2023-08-21 LAB — ECHOCARDIOGRAM COMPLETE
AR max vel: 1.18 cm2
AV Area VTI: 1.27 cm2
AV Area mean vel: 1.17 cm2
AV Mean grad: 14 mmHg
AV Peak grad: 28.9 mmHg
Ao pk vel: 2.69 m/s
Area-P 1/2: 2.82 cm2
Height: 65 in
MV M vel: 4.72 m/s
MV Peak grad: 89.1 mmHg
S' Lateral: 3.7 cm
Weight: 3149.93 [oz_av]

## 2023-08-21 LAB — CBC
HCT: 30.2 % — ABNORMAL LOW (ref 39.0–52.0)
Hemoglobin: 9.6 g/dL — ABNORMAL LOW (ref 13.0–17.0)
MCH: 33.7 pg (ref 26.0–34.0)
MCHC: 31.8 g/dL (ref 30.0–36.0)
MCV: 106 fL — ABNORMAL HIGH (ref 80.0–100.0)
Platelets: 126 K/uL — ABNORMAL LOW (ref 150–400)
RBC: 2.85 MIL/uL — ABNORMAL LOW (ref 4.22–5.81)
RDW: 16 % — ABNORMAL HIGH (ref 11.5–15.5)
WBC: 3.7 K/uL — ABNORMAL LOW (ref 4.0–10.5)
nRBC: 0 % (ref 0.0–0.2)

## 2023-08-21 LAB — RENAL FUNCTION PANEL
Albumin: 3.2 g/dL — ABNORMAL LOW (ref 3.5–5.0)
Anion gap: 16 — ABNORMAL HIGH (ref 5–15)
BUN: 29 mg/dL — ABNORMAL HIGH (ref 6–20)
CO2: 25 mmol/L (ref 22–32)
Calcium: 10.3 mg/dL (ref 8.9–10.3)
Chloride: 97 mmol/L — ABNORMAL LOW (ref 98–111)
Creatinine, Ser: 9.69 mg/dL — ABNORMAL HIGH (ref 0.61–1.24)
GFR, Estimated: 6 mL/min — ABNORMAL LOW (ref >60)
Glucose, Bld: 84 mg/dL (ref 70–99)
Phosphorus: 6.9 mg/dL — ABNORMAL HIGH (ref 2.5–4.6)
Potassium: 4.1 mmol/L (ref 3.5–5.1)
Sodium: 138 mmol/L (ref 135–145)

## 2023-08-21 LAB — HEPATITIS B SURFACE ANTIGEN: Hepatitis B Surface Ag: NONREACTIVE

## 2023-08-21 LAB — HIV ANTIBODY (ROUTINE TESTING W REFLEX): HIV Screen 4th Generation wRfx: NONREACTIVE

## 2023-08-21 LAB — MAGNESIUM: Magnesium: 2.2 mg/dL (ref 1.7–2.4)

## 2023-08-21 MED ORDER — HEPARIN SODIUM (PORCINE) 1000 UNIT/ML DIALYSIS: 1000.0000 [IU] | INTRAMUSCULAR | Status: DC | PRN

## 2023-08-21 MED ORDER — ALTEPLASE 2 MG IJ SOLR: 2.0000 mg | Freq: Once | INTRAMUSCULAR | Status: DC | PRN

## 2023-08-21 MED ORDER — HEPARIN SODIUM (PORCINE) 1000 UNIT/ML IJ SOLN
INTRAMUSCULAR | Status: AC
  Filled 2023-08-21: qty 4

## 2023-08-21 MED ORDER — CHLORHEXIDINE GLUCONATE CLOTH 2 % EX PADS
6.0000 | MEDICATED_PAD | Freq: Every day | CUTANEOUS | Status: DC
  Administered 2023-08-22: 6 via TOPICAL

## 2023-08-21 MED ORDER — HEPARIN SODIUM (PORCINE) 1000 UNIT/ML DIALYSIS: 40.0000 [IU]/kg | INTRAMUSCULAR | Status: DC | PRN

## 2023-08-21 MED ORDER — ANTICOAGULANT SODIUM CITRATE 4% (200MG/5ML) IV SOLN: 5.0000 mL | Status: DC | PRN

## 2023-08-21 MED ORDER — METOPROLOL TARTRATE 50 MG PO TABS
75.0000 mg | ORAL_TABLET | Freq: Two times a day (BID) | ORAL | Status: DC
  Administered 2023-08-21 – 2023-08-22 (×3): 75 mg via ORAL
  Filled 2023-08-21 (×3): qty 1

## 2023-08-21 MED ORDER — SEVELAMER CARBONATE 800 MG PO TABS
3200.0000 mg | ORAL_TABLET | Freq: Three times a day (TID) | ORAL | Status: DC
  Administered 2023-08-22: 3200 mg via ORAL
  Filled 2023-08-21 (×2): qty 4

## 2023-08-21 NOTE — Progress Notes (Signed)
 Daily Progress Note Intern Pager: [phone]  Patient name: James Wall [facility] Medical record number: [id number] Date of birth: 12/29/1965 Age: 58 y.o. Gender: male  Primary Care Provider: Janna Ferrier, DO Consultants: Nephrology, cardiology  Code Status: Full code  Pt Overview and Major Events to Date:  7/17: Admitted, nephrology consulted  7/18: Cardiology consulted   Medical Decision Making:  James Wall is a 58 y.o. male admitted for volume overload likely from CHF but possibly from ESRD contribution. Pertinent PMH/PSH includes CHF, ESRD on dialysis, COPD, cirrhosis, CVA, HLD, HTN.  Assessment & Plan Dyspnea Clinically stable with normal WOB on RA. Dialysis today.  - Telemetry given bradycardia - Echocardiogram pending   - Labs: AM RFP, Mag, CBC - PT/OT eval and treat in setting of new dyspnea on exertion CHF (congestive heart failure) (HCC) EF 60-65% via last echo done 10/2022. Concern for worsening EF given current symptoms. - Echocardiogram - Holding metoprolol  tartrate 100 mg twice daily - Cardiology consulted, appreciate recommendations  ESRD (end stage renal disease) (HCC) Dialysis today. Dialysis MWF outpatient; per patient, often elects to end dialysis early given cramping. - Nephro consulted  Bradycardia HR in high 40s, low 50s; patient asymptomatic.  - Holding metoprolol  tartrate as above - Cardiology consulted, recommendations as above  Chronic health problem HTN: Home amlodipine  10 mg daily at bedtime, clonidine  0.3 mg patch weekly, hydralazine  50 mg twice daily, isosorbide  dinitrate 20 mg twice daily HLD: Home Crestor  10 mg daily GERD: Home pantoprazole  20 mg daily COPD: Low suspicion for exacerbation; albuterol  prn ordered Itching: hydroxyzine  25 mg daily OSA: Patient not currently on CPAP, denied wanting this during admission Tobacco Use: Recently quit 2 weeks ago; Consider TOC consult for further support with tobacco cessation (if patient  desires)    FEN/GI: Renal diet with fluid restriction 1200 mL  PPx: Heparin    Subjective:  Patient is seen during dialysis this morning. He denies chest pain but endorses a sensation of heaviness. He has no other complaints at this time. He does become tearful during this encounter when speaking about his grandmother that raised him who has passed.   Objective: Temp:  [98 F (36.7 C)-98.7 F (37.1 C)] 98.4 F (36.9 C) (07/18 0749) Pulse Rate:  [45-52] 52 (07/18 0749) Resp:  [12-25] 14 (07/18 0749) BP: (134-169)/(70-97) 169/97 (07/18 0749) SpO2:  [93 %-100 %] 100 % (07/18 0500) Weight:  [89.3 kg-91.6 kg] 89.3 kg (07/18 0749) Physical Exam: General: Awake, alert, NAD Cardiovascular: bradycardic, no murmurs Respiratory: CTAB anteriorly Abdomen: bowel sounds present, non-distended, non-tender to palpation Extremities: chronic venous stasis changes bilaterally  Laboratory: Most recent CBC Lab Results  Component Value Date   WBC 3.7 (L) 08/21/2023   HGB 9.6 (L) 08/21/2023   HCT 30.2 (L) 08/21/2023   MCV 106.0 (H) 08/21/2023   PLT 126 (L) 08/21/2023   Most recent BMP    Latest Ref Rng & Units 08/21/2023    6:04 AM  BMP  Glucose 70 - 99 mg/dL 84   BUN 6 - 20 mg/dL 29   Creatinine 9.38 - 1.24 mg/dL 0.30   Sodium 864 - 854 mmol/L 138   Potassium 3.5 - 5.1 mmol/L 4.1   Chloride 98 - 111 mmol/L 97   CO2 22 - 32 mmol/L 25   Calcium  8.9 - 10.3 mg/dL 89.6     Imaging/Diagnostic Tests: No new imaging   James Raguel MATSU, DO 08/21/2023, 7:58 AM  PGY-1, [facility] FPTS Intern pager:  [phone], text pages welcome Secure chat group [facility] Teaching Service

## 2023-08-21 NOTE — Progress Notes (Signed)
  Echocardiogram 2D Echocardiogram has been performed.  James Wall 08/21/2023, 2:56 PM

## 2023-08-21 NOTE — Assessment & Plan Note (Addendum)
 Clinically stable with normal WOB on RA. Dialysis today.  - Telemetry given bradycardia - Echocardiogram pending   - Labs: AM RFP, Mag, CBC - PT/OT eval and treat in setting of new dyspnea on exertion

## 2023-08-21 NOTE — Progress Notes (Signed)
 PT Cancellation Note  Patient Details Name: James Wall MRN: [id number] DOB: 19-Nov-1965   Cancelled Treatment:    Reason Eval/Treat Not Completed: Patient at procedure or test/unavailable. Pt in HD. PT to re-attempt as time allows.   [name] 08/21/2023, 8:13 AM

## 2023-08-21 NOTE — Consult Note (Signed)
 [facility] Renal Consultation Note  Requesting MD: Laymon Legions, MD Indication for Consultation:  ESRD with overload   Chief complaint:  shortness of breath   HPI:  James Wall is a 58 y.o. male with a history of ESRD on HD, CHF, nonalcoholic cirrhosis, and HTN who presented to the hospital with shortness of breath.  He has a history of dialysis noncompliance and signing off early.  He states that he never stays the whole time and doesn't want to cramp.  He reports that he has done this for years and people who have stayed on the whole time have passed away.  Did miss 20-Jul-2025but states that's it.  Last HD on 7/16 with post weight 89.9 kg.  Often ends 3 hours or earlier.  He stopped smoking a few weeks ago.  States his weight is up.  Nephrology was consulted for assistance with management of ESRD and fluid overload and arranged for HD this AM.    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 [facility])    primary cancer is prostate   Anemia associated with chronic renal failure    Anxiety    Arthritis    CHF (congestive heart failure) (HCC)    Cirrhosis, nonalcoholic (HCC)    per pt possible from a medication , unsure ;   last ultrasound 08-09-2020 in epic no fibrosis   COPD (chronic obstructive pulmonary disease) (HCC)    Depression    ESRD on hemodialysis (HCC) 2009   Started HD Jan 2009;  ESRD secondary to hypertensive nephrosclerosis;  dialysis at [facility] at [facility] on MWF   First degree heart block    GERD (gastroesophageal reflux disease)    Hiatal hernia    History of acute respiratory failure 07/2013   admission;  HCAP w/ ARF with hypoxia   History of adenomatous polyp of colon    History of ascites    s/p paracentesis 01-31-2013 (5L) and last one 03-28-2013 (2.7L)   History of community acquired pneumonia 08/08/2020   admission ; LLL , POA   History of heart  murmur in childhood    History of MRSA infection 12/19/2012   hospital admission;  w/ sepsis MRSA bacteramia AVF infection   History of sepsis 03/2017   admission;   HCAP w/ sepsis   Hyperlipidemia    Hypertension    Hypertensive heart disease    cardiologist--- dr emerson leavens;  nuclear stress test 06-16-2013 intermediate risk w/ mid-distal anterior wall ischemia , not gated;  cardiac cath 07-13-2013 in epic showed normal coronaries and LVSF,  ef not assessed, LCEDP   Hypertensive nephrosclerosis, 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)    Intolerance to cold    due to anemia   Malignant neoplasm prostate [facility])    urologist--- dr bell/  radiation onologist--- dr patrcia;  dx 01/ 2023,  Gleason 4+3, PSA 32   Nausea and vomiting 10/19/2022   NICM (nonischemic cardiomyopathy) (HCC)    followed by cardiology;   last echo in epic 09-13-2020 ef 55-60%   OSA (obstructive sleep apnea) 2009   no  longer using cpap since the yr started 2009;   sleep study in epic 05-11-2007 severe osa   Pneumonia    PSVT (paroxysmal supraventricular tachycardia) (HCC)    event monitor 08-01-2019  SR w/ SVT runs , rare PAC/ PVC   Secondary  hyperparathyroidism (HCC)    Stroke (HCC)    2023 weakness Lhand and Left leg   Wears glasses     Past Surgical History:  Procedure Laterality Date   AV FISTULA PLACEMENT Right 02/22/2013   Procedure:  CREATION  OF BRACHIAL CEPHALIC FISTULA RIGHT ARM;  Surgeon: Carlin FORBES Haddock, MD;  Location: [facility] OR;  Service: Vascular;  Laterality: Right;   AV FISTULA PLACEMENT Left 08/10/2014   Procedure: BASILIC VEIN TRANSPOSITION  ARTERIOVENOUS (AV) FISTULA CREATION LEFT UPPER ARM;  Surgeon: Lynwood JONETTA Collum, MD;  Location: [facility] OR;  Service: Vascular;  Laterality: Left;   AV FISTULA PLACEMENT, RADIOCEPHALIC Left 02/22/2007   @[facility] ;  Left lower arm   COLONOSCOPY  11/30/2018   by dr beavers   CYSTOSCOPY W/ RETROGRADES Bilateral 06/26/2022   Procedure:  CYSTOSCOPY WITH BILATERAL RETROGRADE PYELOGRAM;  Surgeon: Carolee Sherwood JONETTA DOUGLAS, MD;  Location: [facility] ORS;  Service: Urology;  Laterality: Bilateral;   ESOPHAGOGASTRODUODENOSCOPY (EGD) WITH PROPOFOL  N/A 04/12/2013   Procedure: ESOPHAGOGASTRODUODENOSCOPY (EGD) WITH PROPOFOL ;  Surgeon: Elsie Cree, MD;  Location: [facility] ENDOSCOPY;  Service: Endoscopy;  Laterality: N/A;   EXCHANGE OF A DIALYSIS CATHETER Right 09/24/2021   Procedure: EXCHANGE OF A DIALYSIS CATHETER;  Surgeon: Eliza Lonni RAMAN, MD;  Location: [facility] OR;  Service: Vascular;  Laterality: Right;   GOLD SEED IMPLANT N/A 06/11/2021   Procedure: GOLD SEED IMPLANT;  Surgeon: Selma Donnice SAUNDERS, MD;  Location: [facility] ORS;  Service: Urology;  Laterality: N/A;   INSERTION OF DIALYSIS CATHETER N/A 12/23/2012   Procedure: INSERTION OF DIALYSIS CATHETER; ULTRASOUND GUIDED;  Surgeon: Lonni RAMAN Eliza, MD;  Location: [facility] OR;  Service: Vascular;  Laterality: N/A;   INSERTION OF DIALYSIS CATHETER  10/22/2015   Right IJ non-tunneled HD catheter, placed again in 2019   IR FLUORO GUIDE CV LINE RIGHT  08/18/2019   IR FLUORO GUIDE CV LINE RIGHT  10/07/2019   IR GENERIC HISTORICAL  10/22/2015   IR US  GUIDE VASC ACCESS RIGHT 10/22/2015 [facility]   IR GENERIC HISTORICAL  10/22/2015   IR FLUORO GUIDE CV LINE RIGHT 10/22/2015 [facility]   IR GENERIC HISTORICAL  10/23/2015   IR FLUORO GUIDE CV LINE RIGHT 10/23/2015 Rome Hall, MD [facility]   LEFT HEART CATHETERIZATION WITH CORONARY ANGIOGRAM N/A 07/13/2013   Procedure: LEFT HEART CATHETERIZATION WITH CORONARY ANGIOGRAM;  Surgeon: Candyce RAMAN Reek, MD;  Location: [facility] CATH LAB;  Service: Cardiovascular;  Laterality: N/A;   LIGATION OF ARTERIOVENOUS  FISTULA Left 12/22/2012   Procedure: LIGATION OF ARTERIOVENOUS  FISTULA;EXCISION OF LARGE ANEURYSMS;;  Surgeon: Carlin FORBES Haddock, MD;  Location: [facility] OR;  Service: Vascular;  Laterality: Left;   SPACE OAR INSTILLATION N/A 06/11/2021   Procedure: SPACE OAR INSTILLATION;   Surgeon: Selma Donnice SAUNDERS, MD;  Location: [facility] ORS;  Service: Urology;  Laterality: N/A;   UPPER GASTROINTESTINAL ENDOSCOPY  09/07/2018   by dr eda    Family Hx:  Family History  Problem Relation Age of Onset   Hypertension Mother    Cerebrovascular Accident Father    Hypertension Father    Congestive Heart Failure Brother    Asthma Brother    Stomach cancer Neg Hx    Rectal cancer Neg Hx    Esophageal cancer Neg Hx    Colon cancer Neg Hx     Social History:  reports that he has been smoking cigarettes. He has a 8.5 pack-year smoking history. He has never used smokeless tobacco. He reports current alcohol use. He reports current  drug use. Frequency: 4.00 times per week. Drug: Marijuana.  Allergies:  Allergies  Allergen Reactions   Venofer   [Ferric Oxide] Itching   Aspirin  Other (See Comments)    Avoids due to renal disease;  Made congestion worse     Medications: Prior to Admission medications   Medication Sig Start Date End Date Taking? Authorizing Provider  acetaminophen  (TYLENOL ) 650 MG CR tablet Take 650 mg by mouth every 8 (eight) hours as needed for pain. Pt takes 2 tablet as needed.    [provider]  amLODipine  (NORVASC ) 10 MG tablet Take 1 tablet (10 mg total) by mouth at bedtime. 12/05/21   Santo Stanly LABOR, MD  cloNIDine  (CATAPRES  - DOSED IN MG/24 HR) 0.3 mg/24hr patch Place 1 patch (0.3 mg total) onto the skin once a week on Sunday. 01/30/22   Santo Stanly LABOR, MD  hydrALAZINE  (APRESOLINE ) 50 MG tablet Take 1 tablet (50 mg total) by mouth 2 (two) times daily. 12/05/21   Chandrasekhar, Stanly LABOR, MD  hydrOXYzine  (ATARAX ) 25 MG tablet Take 25 mg by mouth daily.    [provider]  isosorbide  dinitrate (ISORDIL ) 20 MG tablet Take 1 tablet (20 mg total) by mouth 2 (two) times daily. 12/05/21   Santo Stanly LABOR, MD  latanoprost  (XALATAN ) 0.005 % ophthalmic solution Apply to eye. 04/14/22   [provider]  metoprolol  tartrate  (LOPRESSOR ) 100 MG tablet Take 1 tablet (100 mg total) by mouth 2 (two) times daily. 12/05/21   Santo Stanly LABOR, MD  pantoprazole  (PROTONIX ) 20 MG tablet TAKE 1 TABLET(20 MG) BY MOUTH DAILY 01/09/22   [name], DO  rosuvastatin  (CRESTOR ) 10 MG tablet Take 1 tablet (10 mg total) by mouth daily. 12/05/21   Santo Stanly LABOR, MD  senna (SENOKOT) 8.6 MG TABS tablet Take 1 tablet (8.6 mg total) by mouth daily. 11/06/22   Everhart, Kirstie, DO  SENSIPAR  90 MG tablet Take 90 mg by mouth daily. 05/07/22   [provider]  sevelamer  carbonate (RENVELA ) 800 MG tablet Take 4 tablets (3,200 mg total) by mouth 3 (three) times daily with meals. 09/30/21   Angiulli, Toribio PARAS, PA-C  tamsulosin  (FLOMAX ) 0.4 MG CAPS capsule TAKE 2 CAPSULES(0.8 MG) BY MOUTH DAILY Patient taking differently: Take 0.4 mg by mouth daily. 04/10/22   [name], DO    I have reviewed the patient's current and reported prior to admission medications.  Labs:     Latest Ref Rng & Units 08/21/2023    6:04 AM 08/20/2023   12:21 PM 08/18/2023   12:53 PM  BMP  Glucose 70 - 99 mg/dL 84  87  85   BUN 6 - 20 mg/dL 29  22  30    Creatinine 0.61 - 1.24 mg/dL 0.30  1.80  89.89   Sodium 135 - 145 mmol/L 138  138  137   Potassium 3.5 - 5.1 mmol/L 4.1  4.0  4.3   Chloride 98 - 111 mmol/L 97  97  100   CO2 22 - 32 mmol/L 25  28    Calcium  8.9 - 10.3 mg/dL 89.6  89.4      Urinalysis    Component Value Date/Time   COLORURINE STRAW (A) 03/08/2017 1357   APPEARANCEUR CLEAR 03/08/2017 1357   LABSPEC 1.008 03/08/2017 1357   PHURINE 9.0 (H) 03/08/2017 1357   GLUCOSEU NEGATIVE 03/08/2017 1357   HGBUR SMALL (A) 03/08/2017 1357   BILIRUBINUR NEGATIVE 03/08/2017 1357   KETONESUR NEGATIVE 03/08/2017 1357   PROTEINUR NEGATIVE  03/08/2017 1357   NITRITE NEGATIVE 03/08/2017 1357   LEUKOCYTESUR NEGATIVE 03/08/2017 1357     ROS:  Pertinent items noted in HPI and remainder of comprehensive ROS otherwise negative.  Physical  Exam: Vitals:   08/21/23 0930 08/21/23 1000  BP: (!) 189/90 (!) 140/67  Pulse: 61 (!) 50  Resp:    Temp:    SpO2: 100% 91%     General: adult male in bed in NAD HEENT: NCAT Eyes: EOMI sclera anicteric Neck: supple trachea midline  Heart: S1S2 no rub Lungs: clear and unlabored on room air Abdomen: soft/nt/nd Extremities: 1+ edema bilateral lower extremities  Skin:  no rash on extremities exposed Neuro: alert and oriented x 3 provides hx and follows commands Psych: circumferential and tearful at times; (when discussing death of his mother).  I let him reflect on this; support to patient   Outpatient HD orders:  [facility] M[facility]  4 hours  2K/2 Ca BF 400  DF 800  EDW 89.6 kg Noted profile 4  Assessment/Plan:   # ESRD  - On HD MWF   # Acute on Chronic diastolic CHF  - optimize volume status with HD - Will need to challenge his dry weight here and outpatient  # HTN  - optimize volume status with HD   # Anemia of CKD  - Hb 9.6 - acceptable - Continue ESA outpatient  # Metabolic bone disease  - if remains here will continue outpatient regimen   Disposition per primary team.  It is possible that he would be acceptable for discharge today after HD from a strictly renal standpoint   Thank you for the consult.  Please do not hesitate to contact me with any questions regarding our patient       Katheryn JAYSON Saba 08/21/2023, 11:04 AM

## 2023-08-21 NOTE — Assessment & Plan Note (Addendum)
 EF 60-65% via last echo done 10/2022. Concern for worsening EF given current symptoms. - Echocardiogram - Holding metoprolol  tartrate 100 mg twice daily - Cardiology consulted, appreciate recommendations

## 2023-08-21 NOTE — Assessment & Plan Note (Addendum)
 HR in high 40s, low 50s; patient asymptomatic.  - Holding metoprolol  tartrate as above - Cardiology consulted, recommendations as above

## 2023-08-21 NOTE — Assessment & Plan Note (Addendum)
 Dialysis today. Dialysis MWF outpatient; per patient, often elects to end dialysis early given cramping. - Nephro consulted

## 2023-08-21 NOTE — Consult Note (Signed)
 Cardiology Consultation   Patient ID: James Wall MRN: [id number]; DOB: 07-Nov-1965  Admit date: 08/20/2023 Date of Consult: 08/21/2023  PCP:  Janna Ferrier, DO   [facility] Providers Cardiologist:  Stanly DELENA Leavens, MD     Patient Profile: James Wall is a 58 y.o. male with a hx of COPD, tobacco use, ESRD on HD, HTN, HLD, cirrhosis, HFimEF,  prostate cancer,  PSVT, OSA, CVA 09/2021,  who is being seen 08/21/2023 for the evaluation of chest pain and bradycardia  at the request of Dr Zondra.  History of Present Illness: James Wall with above PMH who presented to ER yesterday for dyspnea on exertion.  Patient reports he has quit smoking 3 weeks ago, had developed increased cough with white phlegm and shortness of breath with exertion since.  He had visited PCP on 08/18/2023 with similar complaints, was advised to go to the ER, had blood work drawn but did not want to wait for complete ER visits. He felt SOB is mostly activity related, such as walking, dressing himself, or cooking. He missed hemodialysis on 08/07/23. He has been dependent on hemodialysis for 16 years. He denied ever having any chest pain. He states if he bend over too long he may have some dizziness with SOB at times.He denied any orthopnea, PND, leg edema, syncope. He thinks his dry weight is around 89 kg. He states he would not consider cardiac cath if he is not receiving general anesthesia.   Per ER workup on 08/20/2023, CMP revealed elevated BUN 22 and creatinine 8.19.  BNP 3638.  High sensitive troponin 32 >32.  CBC was hemoglobin 9.1, platelet 140k. CXR showed Minimal bibasilar subsegmental atelectasis. EKG from 08/20/2023 at 1225 reveals sinus bradycardia 50 bpm, old T wave inversion of inferolateral leads.  Of note, ER workup from 08/18/2023 (2 days ago) had revealed similar CMP and CBC results, BNP was 3524, Hs troponin was 31 >34.  He was seen by teaching medicine service, felt in acute  exacerbation of heart failure and volume overload, nephrology was consulted for hemodialysis, repeat echocardiogram has been requested and pending.  Cardiology consult is requested today for further evaluation.    Per extensive chart review, he was initially seen 2015 by Dr Maranda for exertional dyspnea and fatigue, Lexiscan  stress Myoview from 06/16/2013 was intermediate risk study with mid to distal anterior wall ischemia.  Echocardiogram from 12/2012 with LVEF preserved.  He was arranged for cardiac catheterization but refused the procedure initially because  he wished for general anesthesia to be used. Ultimately he had cardiac cath 07/13/2013 showing no CAD, LVEDP 28. He also was placed on Holter monitor due to palpitation at the time, which showed frequent PVCs. He was lost to follow up after 2015. He was seen again 2019 for uncontrolled HTN, metoprolol  was added for palpitation and PVCs.   He was hospitalized 12/2017 for HTN urgency. Echo 12/2017 showed LVEF down to 45-50%, moderate MAC, mild to mod MR, mod TR, severe pulmonary hypertension, PA peak pressure 97 mmHg. Cardiology was consulted for sinus bradycardia, reduced metoprolol  dosing and recommended sleep evaluation. He was recommended left and right heart cath but refused the procedure due to wish of using general anesthesia. He was treated medically for CHF and continued on dialysis for volume management.   Event monitor repeated 03/09/19 showed few very short runs of SVTs and rare PVC, PACs, and 1 blocked sinus beat.   He transitioned to Dr Leavens in 2022. Echo repeated 09/03/2020 showed  LVEF improved to 55-60% , no RWMA, grade I DD, moderate LVH, normal RV, mildly elevated PASP, severe LAE, mild MR, mild aortic sclerosis. He was on multiple antihypertensives for difficult control HTN. He was on hydralazine  and Imdur  for GDMT and metoprolol  for PSVT and PVC.   He was noted with a stroke on MRI 2023 and was placed on plavix . He was  diagnosed with prostate cancer and completed radiation 2023 as well.   He was last seen 10/22/21, doing well at the time. He had tele visit 06/24/22 for pre-op  evaluation for cystoscopy and bladder tumor resection, had no compliant of cardiac symptoms, no further cardiac testing was felt indicated.  2 week Zio was repeated 11/06/22, showed nonsustained SVT and NSVT, Rare supraventricular ectopy. Frequent ventricular ectopy, 7.3%. 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 Echo last from 10/21/22 showed LVEF 60-65%, no RWMA, mod LVH of the basal-septal segment, grade II Ddm normal RV, trivial MR, aroticl sclerosis.  He suppose to be seen by Orren, PA-C 01/13/23 for follow up, but left the office before appointment occurred.        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 [facility])    primary cancer is prostate   Anemia associated with chronic renal failure    Anxiety    Arthritis    CHF (congestive heart failure) (HCC)    Cirrhosis, nonalcoholic (HCC)    per pt possible from a medication , unsure ;   last ultrasound 08-09-2020 in epic no fibrosis   COPD (chronic obstructive pulmonary disease) (HCC)    Depression    ESRD on hemodialysis (HCC) 2009   Started HD Jan 2009;  ESRD secondary to hypertensive nephrosclerosis;  dialysis at [facility] on MWF   First degree heart block    GERD (gastroesophageal reflux disease)    Hiatal hernia    History of acute respiratory failure 07/2013   admission;  HCAP w/ ARF with hypoxia   History of adenomatous polyp of colon    History of ascites    s/p paracentesis 01-31-2013 (5L) and last one 03-28-2013 (2.7L)   History of community acquired pneumonia 08/08/2020   admission ; LLL , POA   History of heart murmur in childhood    History of MRSA infection 12/19/2012   hospital admission;  w/ sepsis MRSA bacteramia AVF infection   History of sepsis 03/2017   admission;   HCAP w/  sepsis   Hyperlipidemia    Hypertension    Hypertensive heart disease    cardiologist--- dr emerson leavens;  nuclear stress test 06-16-2013 intermediate risk w/ mid-distal anterior wall ischemia , not gated;  cardiac cath 07-13-2013 in epic showed normal coronaries and LVSF,  ef not assessed, LCEDP   Hypertensive nephrosclerosis, 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)    Intolerance to cold    due to anemia   Malignant neoplasm prostate [facility])    urologist--- dr bell/  radiation onologist--- dr patrcia;  dx 01/ 2023,  Gleason 4+3, PSA 32   Nausea and vomiting 10/19/2022   NICM (nonischemic cardiomyopathy) (HCC)    followed by cardiology;   last echo in epic 09-13-2020 ef 55-60%   OSA (obstructive sleep apnea) 2009   no  longer using cpap since the yr started 2009;   sleep study in epic 05-11-2007 severe osa   Pneumonia    PSVT (paroxysmal supraventricular tachycardia) (HCC)  event monitor 08-01-2019  SR w/ SVT runs , rare PAC/ PVC   Secondary hyperparathyroidism (HCC)    Stroke (HCC)    2023 weakness Lhand and Left leg   Wears glasses     Past Surgical History:  Procedure Laterality Date   AV FISTULA PLACEMENT Right 02/22/2013   Procedure:  CREATION  OF BRACHIAL CEPHALIC FISTULA RIGHT ARM;  Surgeon: Carlin FORBES Haddock, MD;  Location: [facility] OR;  Service: Vascular;  Laterality: Right;   AV FISTULA PLACEMENT Left 08/10/2014   Procedure: BASILIC VEIN TRANSPOSITION  ARTERIOVENOUS (AV) FISTULA CREATION LEFT UPPER ARM;  Surgeon: Lynwood JONETTA Collum, MD;  Location: [facility] OR;  Service: Vascular;  Laterality: Left;   AV FISTULA PLACEMENT, RADIOCEPHALIC Left 02/22/2007   @[facility] ;  Left lower arm   COLONOSCOPY  11/30/2018   by dr beavers   CYSTOSCOPY W/ RETROGRADES Bilateral 06/26/2022   Procedure: CYSTOSCOPY WITH BILATERAL RETROGRADE PYELOGRAM;  Surgeon: Carolee Sherwood JONETTA DOUGLAS, MD;  Location: [facility] ORS;  Service: Urology;  Laterality: Bilateral;   ESOPHAGOGASTRODUODENOSCOPY (EGD)  WITH PROPOFOL  N/A 04/12/2013   Procedure: ESOPHAGOGASTRODUODENOSCOPY (EGD) WITH PROPOFOL ;  Surgeon: Elsie Cree, MD;  Location: [facility] ENDOSCOPY;  Service: Endoscopy;  Laterality: N/A;   EXCHANGE OF A DIALYSIS CATHETER Right 09/24/2021   Procedure: EXCHANGE OF A DIALYSIS CATHETER;  Surgeon: Eliza Lonni RAMAN, MD;  Location: [facility] OR;  Service: Vascular;  Laterality: Right;   GOLD SEED IMPLANT N/A 06/11/2021   Procedure: GOLD SEED IMPLANT;  Surgeon: Selma Donnice SAUNDERS, MD;  Location: [facility] ORS;  Service: Urology;  Laterality: N/A;   INSERTION OF DIALYSIS CATHETER N/A 12/23/2012   Procedure: INSERTION OF DIALYSIS CATHETER; ULTRASOUND GUIDED;  Surgeon: Lonni RAMAN Eliza, MD;  Location: [facility] OR;  Service: Vascular;  Laterality: N/A;   INSERTION OF DIALYSIS CATHETER  10/22/2015   Right IJ non-tunneled HD catheter, placed again in 2019   IR FLUORO GUIDE CV LINE RIGHT  08/18/2019   IR FLUORO GUIDE CV LINE RIGHT  10/07/2019   IR GENERIC HISTORICAL  10/22/2015   IR US  GUIDE VASC ACCESS RIGHT 10/22/2015 [facility] RAD   IR GENERIC HISTORICAL  10/22/2015   IR FLUORO GUIDE CV LINE RIGHT 10/22/2015 [facility]   IR GENERIC HISTORICAL  10/23/2015   IR FLUORO GUIDE CV LINE RIGHT 10/23/2015 Rome Hall, MD [facility]INTER[facility] RAD   LEFT HEART CATHETERIZATION WITH CORONARY ANGIOGRAM N/A 07/13/2013   Procedure: LEFT HEART CATHETERIZATION WITH CORONARY ANGIOGRAM;  Surgeon: Candyce RAMAN Reek, MD;  Location: [facility] CATH LAB;  Service: Cardiovascular;  Laterality: N/A;   LIGATION OF ARTERIOVENOUS  FISTULA Left 12/22/2012   Procedure: LIGATION OF ARTERIOVENOUS  FISTULA;EXCISION OF LARGE ANEURYSMS;;  Surgeon: Carlin FORBES Haddock, MD;  Location: [facility] OR;  Service: Vascular;  Laterality: Left;   SPACE OAR INSTILLATION N/A 06/11/2021   Procedure: SPACE OAR INSTILLATION;  Surgeon: Selma Donnice SAUNDERS, MD;  Location: [facility] ORS;  Service: Urology;  Laterality: N/A;   UPPER GASTROINTESTINAL ENDOSCOPY  09/07/2018   by dr eda     Home Medications:   Prior to Admission medications   Medication Sig Start Date End Date Taking? Authorizing Provider  acetaminophen  (TYLENOL ) 650 MG CR tablet Take 650 mg by mouth every 8 (eight) hours as needed for pain. Pt takes 2 tablet as needed.    [provider]  amLODipine  (NORVASC ) 10 MG tablet Take 1 tablet (10 mg total) by mouth at bedtime. 12/05/21   Chandrasekhar, Stanly LABOR, MD  cloNIDine  (CATAPRES  - DOSED IN MG/24 HR) 0.3 mg/24hr patch Place  1 patch (0.3 mg total) onto the skin once a week on Sunday. 01/30/22   Santo Stanly LABOR, MD  hydrALAZINE  (APRESOLINE ) 50 MG tablet Take 1 tablet (50 mg total) by mouth 2 (two) times daily. 12/05/21   Santo Stanly LABOR, MD  hydrOXYzine  (ATARAX ) 25 MG tablet Take 25 mg by mouth daily.    [provider]  isosorbide  dinitrate (ISORDIL ) 20 MG tablet Take 1 tablet (20 mg total) by mouth 2 (two) times daily. 12/05/21   Santo Stanly LABOR, MD  latanoprost  (XALATAN ) 0.005 % ophthalmic solution Apply to eye. 04/14/22   [provider]  metoprolol  tartrate (LOPRESSOR ) 100 MG tablet Take 1 tablet (100 mg total) by mouth 2 (two) times daily. 12/05/21   Santo Stanly LABOR, MD  pantoprazole  (PROTONIX ) 20 MG tablet TAKE 1 TABLET(20 MG) BY MOUTH DAILY 01/09/22   [name], DO  rosuvastatin  (CRESTOR ) 10 MG tablet Take 1 tablet (10 mg total) by mouth daily. 12/05/21   Santo Stanly LABOR, MD  senna (SENOKOT) 8.6 MG TABS tablet Take 1 tablet (8.6 mg total) by mouth daily. 11/06/22   Everhart, Kirstie, DO  SENSIPAR  90 MG tablet Take 90 mg by mouth daily. 05/07/22   [provider]  sevelamer  carbonate (RENVELA ) 800 MG tablet Take 4 tablets (3,200 mg total) by mouth 3 (three) times daily with meals. 09/30/21   Angiulli, Toribio PARAS, PA-C  tamsulosin  (FLOMAX ) 0.4 MG CAPS capsule TAKE 2 CAPSULES(0.8 MG) BY MOUTH DAILY Patient taking differently: Take 0.4 mg by mouth daily. 04/10/22   [name], DO    Scheduled Meds:  amLODipine    10 mg Oral QHS   Chlorhexidine  Gluconate Cloth  6 each Topical Q0600   [START ON 08/23/2023] cloNIDine   0.3 mg Transdermal Q Sun   heparin   5,000 Units Subcutaneous Q8H   hydrALAZINE   50 mg Oral BID   hydrOXYzine   25 mg Oral Daily   isosorbide  dinitrate  20 mg Oral BID   pantoprazole   20 mg Oral Daily   rosuvastatin   10 mg Oral Daily   sodium chloride  flush  3 mL Intravenous Q12H   Continuous Infusions:  anticoagulant sodium citrate      PRN Meds: acetaminophen  **OR** acetaminophen , albuterol , alteplase , anticoagulant sodium citrate , heparin , heparin   Allergies:    Allergies  Allergen Reactions   Venofer   [Ferric Oxide] Itching   Aspirin  Other (See Comments)    Avoids due to renal disease;  Made congestion worse     Social History:   Social History   Socioeconomic History   Marital status: Single    Spouse name: Not on file   Number of children: 0   Years of education: 12th   Highest education level: High school graduate  Occupational History   Occupation: n/a  Tobacco Use   Smoking status: Every Day    Current packs/day: 0.50    Average packs/day: 0.5 packs/day for 17.0 years (8.5 ttl pk-yrs)    Types: Cigarettes   Smokeless tobacco: Never   Tobacco comments:    06-06-2021  per pt had quit smoking 06/ 2021 for 16 yrs , but restarted approx 10/ 2022 1pp 3-4 days  Vaping Use   Vaping status: Never Used  Substance and Sexual Activity   Alcohol use: Yes    Comment: Holidays has one drink   Drug use: Yes    Frequency: 4.0 times per week    Types: Marijuana    Comment: 06-06-2021 per pt smokes 2-4 weekly, (half of joint blunt)  Sexual activity: Not Currently  Other Topics Concern   Not on file  Social History Narrative   Patient lives alone in [place].    Patient has never been married and does not have any children.    Patients support system, his mother, pass away 2016.   Patient does not own his own vehicle, uses public transportation with no concerns at  this time.   Social Drivers of Corporate investment banker Strain: Low Risk  (08/05/2022)   Overall Financial Resource Strain (CARDIA)    Difficulty of Paying Living Expenses: Not hard at all  Food Insecurity: No Food Insecurity (03/20/2023)   Hunger Vital Sign    Worried About Running Out of Food in the Last Year: Never true    Ran Out of Food in the Last Year: Never true  Transportation Needs: No Transportation Needs (03/20/2023)   PRAPARE - Administrator, Civil Service (Medical): No    Lack of Transportation (Non-Medical): No  Physical Activity: Inactive (08/05/2022)   Exercise Vital Sign    Days of Exercise per Week: 0 days    Minutes of Exercise per Session: 0 min  Stress: No Stress Concern Present (08/05/2022)   [employer] of Occupational Health - Occupational Stress Questionnaire    Feeling of Stress : Not at all  Social Connections: Moderately Isolated (08/05/2022)   Social Connection and Isolation Panel    Frequency of Communication with Friends and Family: Once a week    Frequency of Social Gatherings with Friends and Family: Three times a week    Attends Religious Services: 1 to 4 times per year    Active Member of Clubs or Organizations: No    Attends Banker Meetings: Never    Marital Status: Never married  Intimate Partner Violence: Not At Risk (03/20/2023)   Humiliation, Afraid, Rape, and Kick questionnaire    Fear of Current or Ex-Partner: No    Emotionally Abused: No    Physically Abused: No    Sexually Abused: No    Family History:    Family History  Problem Relation Age of Onset   Hypertension Mother    Cerebrovascular Accident Father    Hypertension Father    Congestive Heart Failure Brother    Asthma Brother    Stomach cancer Neg Hx    Rectal cancer Neg Hx    Esophageal cancer Neg Hx    Colon cancer Neg Hx      ROS:  Constitutional: Denied fever, chills, malaise, night sweats Eyes: Denied vision change or  loss Ears/Nose/Mouth/Throat: Denied ear ache, sore throat, sinus pain Cardiovascular: see HPI  Respiratory: see HPI  Gastrointestinal: Denied nausea, vomiting, abdominal pain, diarrhea Genital/Urinary: anuric  Musculoskeletal: Denied muscle ache, joint pain, weakness Skin: Denied rash, wound Neuro: Denied headache, dizziness, syncope Psych: history of depression/anxiety  Endocrine: Denied history of diabetes   Physical Exam/Data: Vitals:   08/21/23 1100 08/21/23 1130 08/21/23 1152 08/21/23 1158  BP: (!) 143/71 (!) 159/87 (!) 143/74 (!) 166/81  Pulse: (!) 53 (!) 56 (!) 52 (!) 57  Resp: 13 14 18 20   Temp:   (!) 97.2 F (36.2 C) 98.1 F (36.7 C)  TempSrc:      SpO2: 99% 99% 97%   Weight:      Height:        Intake/Output Summary (Last 24 hours) at 08/21/2023 1217 Last data filed at 08/21/2023 1158 Gross per 24 hour  Intake --  Output 2900 ml  Net -2900 ml      08/21/2023    7:49 AM 08/20/2023   12:23 PM 08/18/2023   11:39 AM  Last 3 Weights  Weight (lbs) 196 lb 13.9 oz 202 lb 200 lb  Weight (kg) 89.3 kg 91.627 kg 90.719 kg     Body mass index is 32.76 kg/m.   Vitals:  Vitals:   08/21/23 1255 08/21/23 1300  BP: (!) 182/91 (!) 181/89  Pulse: (!) 50   Resp: 18   Temp: (!) 97.3 F (36.3 C)   SpO2: 97%    General Appearance: In no apparent distress, laying in bed, well nourished  HEENT: Normocephalic, atraumatic.  Neck: Supple, trachea midline, no JVDs Cardiovascular: Regular rate and rhythm, normal S1-S2,  no murmur Respiratory: Resting breathing unlabored, lungs sounds clear to auscultation bilaterally, no use of accessory muscles. On room air.  Gastrointestinal: Bowel sounds positive, abdomen soft Extremities: Able to move all extremities in bed without difficulty, no edema of BLE Musculoskeletal: Normal muscle bulk and tone Skin: Intact, warm, dry. Neurologic: Alert, oriented to person, place and time. no cognitive deficit,  no gross focal neuro  deficit Psychiatric: Normal affect. Mood is appropriate.  Right chest Dialysis catheter in place, dressing clean    EKG:  The EKG was personally reviewed and demonstrates:    EKG from 08/20/23 showed sinus bradycarida 50 bpm, old TWI of inferolateral leads   Telemetry:  Telemetry was personally reviewed and demonstrates:    Sinus bradycardia mid 40s to high 50s, no pauses>3s   Relevant CV Studies:   2 weeks Zio monitor 11/06/2022:   Patch Wear Time:  13 days and 8 hours (2024-10-07T05:53:53-0400 to 2024-10-20T14:23:23-0400)   HR 49 - 210, average 75 bpm. 26 nonsustained SVT (longest 7 mins and 38s) and 2 nonsustained VT (longest 6 beats) No atrial fibrillation detected. Rare supraventricular ectopy. Frequent ventricular ectopy, 7.3%. 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 Symptom trigger episodes correspond to PVCs    Echo from 10/21/22:   1. Left ventricular ejection fraction, by estimation, is 60 to 65%. The  left ventricle has normal function. The left ventricle has no regional  wall motion abnormalities. There is moderate asymmetric left ventricular  hypertrophy of the basal-septal  segment. Left ventricular diastolic parameters are consistent with Grade  II diastolic dysfunction (pseudonormalization). Elevated left atrial  pressure.   2. Right ventricular systolic function is normal. The right ventricular  size is normal. Tricuspid regurgitation signal is inadequate for assessing  PA pressure.   3. The mitral valve is normal in structure. Trivial mitral valve  regurgitation.   4. The aortic valve was not well visualized. Aortic valve regurgitation  is not visualized. Aortic valve sclerosis is present, with no evidence of  aortic valve stenosis.   5. The inferior vena cava is normal in size with greater than 50%  respiratory variability, suggesting right atrial pressure of 3 mmHg.    Laboratory Data: High Sensitivity Troponin:   Recent Labs  Lab 08/18/23 1239  08/18/23 1733 08/20/23 1221 08/20/23 1747  TROPONINIHS 31* 34* 32* 32*     Chemistry Recent Labs  Lab 08/18/23 1239 08/18/23 1253 08/20/23 1221 08/21/23 0604  NA 140 137 138 138  K 4.4 4.3 4.0 4.1  CL 98 100 97* 97*  CO2 26  --  28 25  GLUCOSE 86 85 87 84  BUN 28* 30* 22* 29*  CREATININE 9.52* 10.10* 8.19* 9.69*  CALCIUM  10.8*  --  10.5* 10.3  MG  --   --   --  2.2  GFRNONAA 6*  --  7* 6*  ANIONGAP 16*  --  13 16*    Recent Labs  Lab 08/18/23 1239 08/20/23 1221 08/21/23 0604  PROT 7.1 6.9  --   ALBUMIN  3.6 3.5 3.2*  AST 18 15  --   ALT 10 10  --   ALKPHOS 66 59  --   BILITOT 1.1 1.0  --    Lipids No results for input(s): CHOL, TRIG, HDL, LABVLDL, LDLCALC, CHOLHDL in the last 168 hours.  Hematology Recent Labs  Lab 08/18/23 1239 08/18/23 1253 08/20/23 1221 08/21/23 0604  WBC 4.7  --  4.3 3.7*  RBC 2.94*  --  2.62* 2.85*  HGB 10.0* 10.9* 9.1* 9.6*  HCT 31.7* 32.0* 27.9* 30.2*  MCV 107.8*  --  106.5* 106.0*  MCH 34.0  --  34.7* 33.7  MCHC 31.5  --  32.6 31.8  RDW 17.0*  --  16.3* 16.0*  PLT 173  --  140* 126*   Thyroid  No results for input(s): TSH, FREET4 in the last 168 hours.  BNP Recent Labs  Lab 08/18/23 1239 08/20/23 1221  BNP 3,524.7* 6,361.1*    DDimer No results for input(s): DDIMER in the last 168 hours.  Radiology/Studies:  DG Chest 2 View Result Date: 08/20/2023 CLINICAL DATA:  Dyspnea. EXAM: CHEST - 2 VIEW COMPARISON:  August 18, 2023. FINDINGS: Stable cardiomediastinal silhouette. Right internal jugular dialysis catheter is again noted. Minimal bibasilar subsegmental atelectasis is noted. Bony thorax is unremarkable. IMPRESSION: Minimal bibasilar subsegmental atelectasis. Aortic Atherosclerosis (ICD10-I70.0). Electronically Signed   By: Lynwood Landy Raddle M.D.   On: 08/20/2023 14:37   DG Chest 1 View Result Date: 08/18/2023 CLINICAL DATA:  [id number] SOB (shortness of breath) [id number] EXAM: CHEST  1 VIEW COMPARISON:  Chest x-ray  10/19/2022, CT chest 03/11/2017 FINDINGS: Patient is rotated. The heart and mediastinal contours are unchanged. Atherosclerotic plaque. Right chest wall dialysis catheter with tip overlying the expected region the superior caval junction. Question interval development of the right mid lower lung zone patchy airspace opacities. No pulmonary edema. No pleural effusion. No pneumothorax. No acute osseous abnormality. IMPRESSION: 1. Question interval development of the right mid lower lung zone patchy airspace opacities. Finding could represent developing infection/inflammation. Slightly elevated by patient rotation. Recommend repeat chest x-ray PA and lateral view for further evaluation. 2.  Aortic Atherosclerosis (ICD10-I70.0). Electronically Signed   By: Morgane  Naveau M.D.   On: 08/18/2023 13:43     Assessment and Plan:  Dyspnea on exertion  - c/o 3 weeks progressive DOE, no orthopnea, PND, peripheral edema, chest pain - Hs trop 30s flat, not consistent with ACS - EKG without acute changes  - CXR no acute finding  - Echo pending  - 2015 cath showed no CAD  - historically refused cardiac cath  due to wish to use general anesthesia, continue wish general anesthesia provided for cath at this time  - suspect DOE due to volume overload, if Echo unremarkable, would not obtain further ischemic evaluation - continue HD to manage volume, continue medical therapy with amlodipine  10mg , isodil 20mg  BID, crestor  10mg    Sinus bradycardia  - long standing history of sinus bradycardia, also had paroxysmal SVT, NSVT, frequent PVC in the past monitor, historically maintained on metoprolol  100mg  BID, had dosing reduced in the past but ultimately return to higher dose due to above reasons  - no signifcant symptoms with HR 40-50s, no nocturnal pauses>3s, uses CPAP nightly,  no indication for PPM at  this time and he is not a good candidate for that, will reduce metoprolol  to 75mg  BID    HFimEF - LVEF down to 45-50%  in 2019 Echo, refused cath without using general anesthesia, EF recovered since 2022, etiology unclear, had severe pulmonary HTN showing on Echo at one point but refused cath - will follow Echo ordered per primary team  - continue dialysis for volume management  - GDMT: transition to toprolol XL at discharge, continue isodil 20mg  BID and hydralazine  50mg  BID, not candidate for ARNI/ACEI/ARB/SGLT2I/MRA due to ESRD   HTN - BP elevated, historically poorly controlled  - on clonidine  0.3mg  weekly, hydralazine  50mg  BID, and isordil  20mg  BID, will defer to nephrology for further change of antihypertensives      Risk Assessment/Risk Scores:    [place]  Heart Association (NYHA) Functional Class NYHA Class III       For questions or updates, please contact [facility] Please consult [email or web address] for contact info under    Signed, Nihal Doan, NP  08/21/2023 12:17 PM

## 2023-08-21 NOTE — Assessment & Plan Note (Addendum)
 HTN: Home amlodipine  10 mg daily at bedtime, clonidine  0.3 mg patch weekly, hydralazine  50 mg twice daily, isosorbide  dinitrate 20 mg twice daily HLD: Home Crestor  10 mg daily GERD: Home pantoprazole  20 mg daily COPD: Low suspicion for exacerbation; albuterol  prn ordered Itching: hydroxyzine  25 mg daily OSA: Patient not currently on CPAP, denied wanting this during admission Tobacco Use: Recently quit 2 weeks ago; Consider TOC consult for further support with tobacco cessation (if patient desires)

## 2023-08-21 NOTE — Progress Notes (Signed)
 OT Cancellation Note  Patient Details Name: James Wall MRN: [id number] DOB: 24-Jun-1965   Cancelled Treatment:    Reason Eval/Treat Not Completed: Patient at procedure or test/ unavailable. Pt at HD, OT will follow up next available time as able  [name] 08/21/2023, 11:28 AM

## 2023-08-22 ENCOUNTER — Other Ambulatory Visit (HOSPITAL_COMMUNITY): Payer: Self-pay

## 2023-08-22 DIAGNOSIS — R0600 Dyspnea, unspecified: Secondary | ICD-10-CM | POA: Diagnosis not present

## 2023-08-22 LAB — HEPATITIS B SURFACE ANTIBODY, QUANTITATIVE: Hep B S AB Quant (Post): 3783 m[IU]/mL

## 2023-08-22 LAB — BASIC METABOLIC PANEL WITH GFR
Anion gap: 8 (ref 5–15)
BUN: 20 mg/dL (ref 6–20)
CO2: 30 mmol/L (ref 22–32)
Calcium: 10.2 mg/dL (ref 8.9–10.3)
Chloride: 98 mmol/L (ref 98–111)
Creatinine, Ser: 7.15 mg/dL — ABNORMAL HIGH (ref 0.61–1.24)
GFR, Estimated: 8 mL/min — ABNORMAL LOW (ref >60)
Glucose, Bld: 98 mg/dL (ref 70–99)
Potassium: 4 mmol/L (ref 3.5–5.1)
Sodium: 136 mmol/L (ref 135–145)

## 2023-08-22 LAB — MAGNESIUM: Magnesium: 2 mg/dL (ref 1.7–2.4)

## 2023-08-22 MED ORDER — METOPROLOL TARTRATE 75 MG PO TABS
75.0000 mg | ORAL_TABLET | Freq: Two times a day (BID) | ORAL | 0 refills | Status: DC
  Filled 2023-08-22: qty 60, 30d supply, fill #0

## 2023-08-22 NOTE — Care Management Obs Status (Signed)
 MEDICARE OBSERVATION STATUS NOTIFICATION   Patient Details  Name: James Wall MRN: [id number] Date of Birth: Jun 22, 1965   Medicare Observation Status Notification Given:  Yes    Robynn Eileen Hoose, RN 08/22/2023, 8:47 AM

## 2023-08-22 NOTE — Care Management (Cosign Needed)
    Durable Medical Equipment  (From admission, onward)           Start     Ordered   08/22/23 1111  For home use only DME 4 wheeled rolling walker with seat  Once       Question:  Patient needs a walker to treat with the following condition  Answer:  Weakness   08/22/23 1111   08/22/23 1037  For home use only DME Walker rolling  Once       Question Answer Comment  Walker: With 5 Inch Wheels   Patient needs a walker to treat with the following condition ESRD (end stage renal disease) on dialysis [facility])   Patient needs a walker to treat with the following condition Gait abnormality   Patient needs a walker to treat with the following condition HF (heart failure) (HCC)      08/22/23 1037           Per therapy recommendations

## 2023-08-22 NOTE — Assessment & Plan Note (Signed)
 HR in high 40s, low 50s; patient asymptomatic.  - Modified metoprolol  tartrate as above - Cardiology consulted, recommendations as above

## 2023-08-22 NOTE — Evaluation (Signed)
 Physical Therapy Brief Evaluation and Discharge Note Patient Details Name: James Wall MRN: [id number] DOB: 17-May-1965 Today's Date: 08/22/2023   History of Present Illness  James Wall is a 58 y.o. male who presented to [facility]/19/13 with dyspnea on exertion. PMHx: CHF, ESRD on dialysis, COPD, cirrhosis, CVA, HLD, HTN, GERD, and OSA.   Clinical Impression  Pt greeted supine in bed, pleasant and agreeable to PT evaluation. PTA, pt was modI with functional mobility using SPC vs. quad cane and modI with ADLs/IADLs. He lives alone in apartment with a level entry. He demonstrated modI with functional mobility and ADLs. Pt donned socks, shoes, and coat independently. He ambulated ~361ft using SPC. Pt maintained a conversation throughout mobility and his SpO2 >95% on RA. Educated pt on energy conservation techniques and the important of pacing as well as prioritizing tasks. He would benefit from a rollator to take a seated rest break as he fatigues or experiences DoE. Pt feels ready and safe for discharge home. I have answered all his questions related to mobility. He appears to be at his baseline function, no further PT needs.     PT Assessment Patient does not need any further PT services  Assistance Needed at Discharge  None    Equipment Recommendations Rollator (4 wheels)  Recommendations for Other Services       Precautions/Restrictions Precautions Precautions: Fall Recall of Precautions/Restrictions: Intact Restrictions Weight Bearing Restrictions Per Provider Order: No        Mobility  Bed Mobility   Supine/Sidelying to sit: Modified independent (Device/Increased time)   General bed mobility comments: Pt performed supine>sit with HOB elevated.  Transfers Overall transfer level: Modified independent Equipment used: Straight cane               General transfer comment: Pt stood from lowest bed height. He powered up with BUE support on bed. Good eccentric  control with sitting.    Ambulation/Gait Ambulation/Gait assistance: Modified independent (Device/Increase time) Gait Distance (Feet): 300 Feet Assistive device: Straight cane Gait Pattern/deviations: WFL(Within Functional Limits), Step-through pattern Gait Speed: Pace WFL General Gait Details: Pt ambulated with a reciprocal gait pattern, even weight shift, good foot clearence. He kept SPC in RUE and advanced it at the same time as LLE. Pt maintained conversation throughout ambulation. He navigated room/hallway well, without LOB.  Home Activity Instructions    Stairs            Modified Rankin (Stroke Patients Only)        Balance Overall balance assessment: Modified Independent                        Pertinent Vitals/Pain PT - Brief Vital Signs All Vital Signs Stable: Yes Pain Assessment Pain Assessment: No/denies pain     Home Living Family/patient expects to be discharged to:: Private residence Living Arrangements: Alone Available Help at Discharge: Family;Available PRN/intermittently Home Environment: Level entry   Home Equipment: Cane - quad;Cane - single point        Prior Function Level of Independence: Independent with assistive device(s) Comments: Ambulates using SPC vs. quad cane. Denies falls in the past 44mo. Can drive but doesn't have a car. Uses the bus system to get to dialysis. Cooks/cleans.    UE/LE Assessment   UE ROM/Strength/Tone/Coordination: WFL    LE ROM/Strength/Tone/Coordination: [facility]      Communication   Communication Communication: No apparent difficulties     Cognition Overall Cognitive Status: Appears within functional  limits for tasks assessed/performed       General Comments General comments (skin integrity, edema, etc.): Educated pt on energy conservation techniques and pacing of activities. Cued PLB technique. SpO2 >95% on RA throughout session.    Exercises     Assessment/Plan    PT Problem List          PT Visit Diagnosis Other abnormalities of gait and mobility (R26.89)    No Skilled PT Patient is modified independent with all activity/mobility;Patient at baseline level of functioning;All education completed   Co-evaluation                AMPAC 6 Clicks Help needed turning from your back to your side while in a flat bed without using bedrails?: None Help needed moving from lying on your back to sitting on the side of a flat bed without using bedrails?: None Help needed moving to and from a bed to a chair (including a wheelchair)?: None Help needed standing up from a chair using your arms (e.g., wheelchair or bedside chair)?: None Help needed to walk in hospital room?: None Help needed climbing 3-5 steps with a railing? : A Little 6 Click Score: 23      End of Session Equipment Utilized During Treatment: Gait belt Activity Tolerance: Patient tolerated treatment well Patient left: in bed;with call bell/phone within reach Nurse Communication: Mobility status PT Visit Diagnosis: Other abnormalities of gait and mobility (R26.89)     Time: 9053-8993 PT Time Calculation (min) (ACUTE ONLY): 20 min  Charges:   PT Evaluation $PT Eval Low Complexity: 1 Low      Randall SAUNDERS, PT, DPT Acute Rehabilitation Services Office: [phone] Secure Chat Preferred  James Wall  08/22/2023, 10:33 AM

## 2023-08-22 NOTE — Progress Notes (Signed)
 FMTS Brief Note In to see patient. Reports he is feeling improved. Stayed entire HD session yesterday. Feels well. Examined--volume status improved from outpatient evaluation.  Plan for discharge today with PCP follow up and discussed importance of staying entirety of HD sessions.

## 2023-08-22 NOTE — Assessment & Plan Note (Signed)
 Dialysis MWF outpatient; per patient, often elects to end dialysis early given cramping. - Nephro consulted

## 2023-08-22 NOTE — Progress Notes (Signed)
 Patient was waiting on equipment to be delivered. He waited until 2 pm and then asked if equipment could instead be delivered to his home because it was taking so long. Notified the case manager and she changed the delivery to the patient's home.

## 2023-08-22 NOTE — Discharge Summary (Addendum)
 Family Medicine Teaching [facility] Discharge Summary  Patient name: James Wall Medical record number: [id number] Date of birth: 23-Feb-1965 Age: 58 y.o. Gender: male Date of Admission: 08/20/2023  Date of Discharge: 08/22/23 Admitting Physician: Alan Flies, MD  Primary Care Provider: Janna Ferrier, DO Consultants: Cardiology  Indication for Hospitalization: Dyspnea on exertion  Discharge Diagnoses/Problem List:  Principal Problem for Admission: CHF exacerbation  Other Problems addressed during stay:  Principal Problem:   Dyspnea Active Problems:   ESRD (end stage renal disease) (HCC)   CHF (congestive heart failure) (HCC)   Chronic health problem   Bradycardia  Brief Hospital Course:  James Wall is a 58 y.o. year old with a history of CHF, ESRD on dialysis, COPD, cirrhosis, CVA, HLD, HTN, GERD, OSA  who presented with dyspnea on exertion and was admitted to the [facility] Medicine Teaching Service for CHF exacerbation.  Dyspnea on exertion CHF Bradycardia Presented presented with 2 weeks of worsening dyspnea on exertion.  Given cardiac history and ESRD concern for new reduction in EF.  Echo was ordered.  Patient was also noted to have heart rates in the 40s with potential heart block seen on telemetry. Cardiology consulted for symptomatic bradycardia and heart failure, they recommended continuing antihypertensive therapy and continued smoking cessation.  Patient's metoprolol  was reduced to 75 mg twice daily.  Echo was significant for stable EF at 60 to 65% with consistent findings of diastolic dysfunction.  Volume was managed by dialysis as below.  Patient remained stable on room air throughout admission.   ESRD on Dialysis Patient continued on MWF dialysis schedule while inpatient.    Other chronic conditions were medically managed with home medications and formulary alternatives as necessary (HTN, HLD, GERD, COPD).  PCP Follow-up  Recommendations: Follow-up tobacco cessation 2.   Cardiology recommends follow-up outpatient in 4-6 weeks  3.   F/u possible need for zio patch w/ OP cardiology. Patient had prior zio in Oct 24.     Results/Tests Pending at Time of Discharge:  Unresulted Labs (From admission, onward)    None       Disposition: Home, independent  Discharge Condition: Stable, bradycardic w/ no associated symptoms.   Discharge Exam:  Vitals:   08/22/23 0827 08/22/23 0903  BP: (!) 147/76   Pulse: (!) 46 (!) 46  Resp: 18   Temp: 98.1 F (36.7 C)   SpO2: 98%    General: Awake, alert, no acute distress.  Communicating clearly Cardio: Regular rate and rhythm with no murmurs rubs or gallops appreciated Respiratory: Auscultation bilaterally with normal work of breathing on room air Extremities: Chronic venous stasis changes b/l lower extremities  Significant Procedures: HD MWF, Echo 7/18  Significant Labs and Imaging:  Recent Labs  Lab 08/20/23 1221 08/21/23 0604  WBC 4.3 3.7*  HGB 9.1* 9.6*  HCT 27.9* 30.2*  PLT 140* 126*   Recent Labs  Lab 08/20/23 1221 08/21/23 0604 08/22/23 0404  NA 138 138 136  K 4.0 4.1 4.0  CL 97* 97* 98  CO2 28 25 30   GLUCOSE 87 84 98  BUN 22* 29* 20  CREATININE 8.19* 9.69* 7.15*  CALCIUM  10.5* 10.3 10.2  MG  --  2.2 2.0  PHOS  --  6.9*  --   ALKPHOS 59  --   --   AST 15  --   --   ALT 10  --   --   ALBUMIN  3.5 3.2*  --     Echo 7/18:  LVEF 60-65%. No wall abnormalities. LV mildly dilated, mild concentric LV hypertrophy. GIIDD. RA pressure elevated. Severely elevated PA systolic pressure. LA moderately dilated. Mild mitral regurg.     CXR 7/17:  Radiologist impression: Minimal bibasilar subsegmental atelectasis.    Discharge Medications:  Allergies as of 08/22/2023       Reactions   Venofer   [ferric Oxide] Itching   Aspirin  Other (See Comments)   Avoids due to renal disease;  Made congestion worse        Medication List     TAKE  these medications    acetaminophen  650 MG CR tablet Commonly known as: TYLENOL  Take 650 mg by mouth every 8 (eight) hours as needed for pain. Pt takes 2 tablet as needed.   amLODipine  10 MG tablet Commonly known as: NORVASC  Take 1 tablet (10 mg total) by mouth at bedtime.   cloNIDine  0.3 mg/24hr patch Commonly known as: CATAPRES  - Dosed in mg/24 hr Place 1 patch (0.3 mg total) onto the skin once a week on Sunday.   hydrALAZINE 50 MG tablet Commonly known as: APRESOLINE Take 1 tablet (50 mg total) by mouth 2 (two) times daily.   hydrOXYzine 25 MG tablet Commonly known as: ATARAX Take 25 mg by mouth daily.   isosorbide dinitrate 20 MG tablet Commonly known as: ISORDIL Take 1 tablet (20 mg total) by mouth 2 (two) times daily.   latanoprost 0.005 % ophthalmic solution Commonly known as: XALATAN Apply to eye.   Metoprolol Tartrate 75 MG Tabs Take 1 tablet (75 mg total) by mouth 2 (two) times daily. What changed:  medication strength how much to take   pantoprazole 20 MG tablet Commonly known as: PROTONIX TAKE 1 TABLET(20 MG) BY MOUTH DAILY   rosuvastatin 10 MG tablet Commonly known as: CRESTOR Take 1 tablet (10 mg total) by mouth daily.   senna 8.6 MG Tabs tablet Commonly known as: SENOKOT Take 1 tablet (8.6 mg total) by mouth daily.   Sensipar 90 MG tablet Generic drug: cinacalcet Take 90 mg by mouth daily.   sevelamer carbonate 800 MG tablet Commonly known as: RENVELA Take 4 tablets (3,200 mg total) by mouth 3 (three) times daily with meals.   tamsulosin 0.4 MG Caps capsule Commonly known as: FLOMAX TAKE 2 CAPSULES(0.8 MG) BY MOUTH DAILY What changed: See the new instructions.               Durable Medical Equipment  (From admission, onward)           Start     Ordered   08/22/23 1111  For home use only DME 4 wheeled rolling walker with seat  Once       Question:  Patient needs a walker to treat with the following condition  Answer:  Weakness    08/22/23 1111   08/22/23 1037  For home use only DME Walker rolling  Once       Question Answer Comment  Walker: With 5 Inch Wheels   Patient needs a walker to treat with the following condition ESRD (end stage renal disease) on dialysis (HCC)   Patient needs a walker to treat with the following condition Gait abnormality   Patient needs a walker to treat with the following condition HF (heart failure) (HCC)      07 /19/25 1037            Discharge Instructions: Please refer to Patient Instructions section of EMR for full details.  Patient was counseled important signs and  symptoms that should prompt return to medical care, changes in medications, dietary instructions, activity restrictions, and follow up appointments.   Follow-Up Appointments:  Future Appointments  Date Time Provider Department Center  08/26/2023  9:20 AM Janna Ferrier, DO [facility]     Manon Jester, DO 08/22/2023, 12:00 PM PGY-1, [facility] Family Medicine  I have verified that the resident's  findings are accurately documented in the resident's note. I have made edits and changes where appropriate, and agree with plan.  Ozell Provencal, MD, PGY-3 [facility] 12:00 PM 08/22/2023

## 2023-08-22 NOTE — Assessment & Plan Note (Signed)
 HTN: Home amlodipine  10 mg daily at bedtime, clonidine  0.3 mg patch weekly, hydralazine  50 mg twice daily, isosorbide  dinitrate 20 mg twice daily HLD: Home Crestor  10 mg daily GERD: Home pantoprazole  20 mg daily COPD: Low suspicion for exacerbation; albuterol  prn ordered Itching: hydroxyzine  25 mg daily OSA: Patient not currently on CPAP, denied wanting this during admission Tobacco Use: Recently quit 2 weeks ago; Consider TOC consult for further support with tobacco cessation (if patient desires)

## 2023-08-22 NOTE — Progress Notes (Signed)
 [facility] Progress Note  Name: James Wall MRN: [id number] DOB: [birth year]  Chief Complaint:  Shortness of breath   Subjective:  He had 400 mL UOP over 7/18.  Plans is for discharge today per charting.  Last HD on 7/18 with 2.9 kg UF.  He states he is going home today.   Review of systems:  Denies shortness of breath or chest pain  Denies n/v   Intake/Output Summary (Last 24 hours) at 08/22/2023 1254 Last data filed at 08/22/2023 1100 Gross per 24 hour  Intake 363 ml  Output 400 ml  Net -37 ml    Vitals:  Vitals:   08/22/23 0529 08/22/23 0827 08/22/23 0903 08/22/23 1204  BP: (!) 159/75 (!) 147/76  121/75  Pulse: (!) 48 (!) 46 (!) 46 75  Resp:  18    Temp: 98.2 F (36.8 C) 98.1 F (36.7 C)  98.4 F (36.9 C)  TempSrc: Oral   Oral  SpO2: 100% 98%  99%  Weight:      Height:         Physical Exam:   General: adult male in bed in NAD HEENT: NCAT Eyes: EOMI sclera anicteric Neck: supple trachea midline  Heart: S1S2 no rub Lungs: clear and unlabored on room air Abdomen: soft/nt/nd Extremities: trace to 1+ edema bilateral lower extremities  Skin:  no rash on extremities exposed Neuro: alert and oriented x 3 provides hx and follows commands   Medications reviewed   Labs:     Latest Ref Rng & Units 08/22/2023    4:04 AM 08/21/2023    6:04 AM 08/20/2023   12:21 PM  BMP  Glucose 70 - 99 mg/dL 98  84  87   BUN 6 - 20 mg/dL 20  29  22    Creatinine 0.61 - 1.24 mg/dL 2.84  0.30  1.80   Sodium 135 - 145 mmol/L 136  138  138   Potassium 3.5 - 5.1 mmol/L 4.0  4.1  4.0   Chloride 98 - 111 mmol/L 98  97  97   CO2 22 - 32 mmol/L 30  25  28    Calcium  8.9 - 10.3 mg/dL 89.7  89.6  89.4      Assessment/Plan:   # ESRD  - On HD MWF    # Acute on Chronic diastolic CHF  - optimize volume status with HD - Will need to challenge his dry weight as outpatient   # HTN  - optimize volume status with HD    # Anemia of CKD  - Hb 9.6 - acceptable -  Continue ESA outpatient   # Metabolic bone disease  - if remains here will continue outpatient regimen    Disposition per primary team.  See order is in for discharge today   Katheryn JAYSON Saba, MD 08/22/2023 1:14 PM

## 2023-08-22 NOTE — Progress Notes (Shared)
 Daily Progress Note Intern Pager: [phone]  Patient name: James Wall Medical record number: [id number] Date of birth: 03/26/1965 Age: 58 y.o. Gender: male  Primary Care Provider: Janna Ferrier, DO Consultants: Nephrology, cardiology Code Status: Full  Pt Overview and Major Events to Date:  7/17: Admitted, nephrology consulted 7/18: Cardiology consulted  Medical Decision Making: 58 year old male presenting with dyspnea on exertion. Differential includes CHF exacerbation (prior history, BNP 3600, LE edema), fluid retention 2/2 ESRD (fluid overload, patient endorses ending dialysis early 2/2 cramping), ACS (less likely given flat trops, EKG consistent with prior). Pertinent medical history includes ESRD on HD, CHF, hypertension, hyperlipidemia, COPD.  Assessment & Plan Dyspnea Clinically stable with normal WOB on RA.  - Telemetry given bradycardia - Echocardiogram showing: LVEF 60-65%. No wall abnormalities. LV mildly dilated, mild concentric LV hypertrophy. GIIDD. RA pressure elevated. Severely elevated PA systolic pressure. LA moderately dilated. Mild mitral regurg.    - Labs: AM RFP, Mag, CBC - 7/19AM labs stable - PT/OT eval and treat in setting of new dyspnea on exertion CHF (congestive heart failure) (HCC) EF 60-65% via last echo done 10/2022. Concern for worsening EF given current symptoms. - Echocardiogram as above - Receiving metoprolol  tartrate 75 mg twice daily, reduced from home 100mg  - Cardiology consulted, appreciate recommendations  ESRD (end stage renal disease) (HCC) Dialysis MWF outpatient; per patient, often elects to end dialysis early given cramping. - Nephro consulted  Bradycardia HR in high 40s, low 50s; patient asymptomatic.  - Modified metoprolol  tartrate as above - Cardiology consulted, recommendations as above  Chronic health problem HTN: Home amlodipine  10 mg daily at bedtime, clonidine  0.3 mg patch weekly, hydralazine  50 mg twice daily,  isosorbide  dinitrate 20 mg twice daily HLD: Home Crestor  10 mg daily GERD: Home pantoprazole  20 mg daily COPD: Low suspicion for exacerbation; albuterol  prn ordered Itching: hydroxyzine  25 mg daily OSA: Patient not currently on CPAP, denied wanting this during admission Tobacco Use: Recently quit 2 weeks ago; Consider TOC consult for further support with tobacco cessation (if patient desires)   FEN/GI: Renal diet with fluid restriction 1200 mL PPx: Heparin  Dispo:Home today. Barriers include PT/OT eval.   Subjective:  ***  Objective: Temp:  [97.2 F (36.2 C)-99.9 F (37.7 C)] 98.2 F (36.8 C) (07/19 0529) Pulse Rate:  [48-61] 48 (07/19 0529) Resp:  [13-26] 18 (07/18 2109) BP: (127-189)/(67-97) 159/75 (07/19 0529) SpO2:  [91 %-100 %] 100 % (07/19 0529) Weight:  [89.3 kg] 89.3 kg (07/18 0749) Physical Exam: General: *** Cardiovascular: *** Respiratory: *** Abdomen: *** Extremities: ***  Laboratory: Most recent CBC Lab Results  Component Value Date   WBC 3.7 (L) 08/21/2023   HGB 9.6 (L) 08/21/2023   HCT 30.2 (L) 08/21/2023   MCV 106.0 (H) 08/21/2023   PLT 126 (L) 08/21/2023   Most recent BMP    Latest Ref Rng & Units 08/22/2023    4:04 AM  BMP  Glucose 70 - 99 mg/dL 98   BUN 6 - 20 mg/dL 20   Creatinine 9.38 - 1.24 mg/dL 2.84   Sodium 864 - 854 mmol/L 136   Potassium 3.5 - 5.1 mmol/L 4.0   Chloride 98 - 111 mmol/L 98   CO2 22 - 32 mmol/L 30   Calcium  8.9 - 10.3 mg/dL 89.7   Hep B surface antigen negative  Imaging/Diagnostic Tests:  Echo 7/18:   1. Left ventricular ejection fraction, by estimation, is 60 to 65%. The  left ventricle has normal function. The  left ventricle has no regional wall motion abnormalities. The left ventricular internal cavity size was mildly dilated. There is mild concentric left ventricular hypertrophy. Left ventricular diastolic parameters are consistent with Grade II diastolic dysfunction (pseudonormalization). Elevated left atrial  pressure.   2. Right ventricular systolic function is normal. The right ventricular size is normal. There is severely elevated pulmonary artery systolic pressure.   3. Left atrial size was moderately dilated.   4. Mild mitral valve regurgitation.   5. The aortic valve is tricuspid. Aortic valve regurgitation is not visualized. Aortic valve sclerosis/calcification is present, without any evidence of aortic stenosis.   6. The inferior vena cava is dilated in size with >50% respiratory variability, suggesting right atrial pressure of 8 mmHg.    Manon Jester, DO 08/22/2023, 7:37 AM  PGY-1, [facility] Family Medicine FPTS Intern pager: [phone], text pages welcome Secure chat group [facility] Teaching Service

## 2023-08-22 NOTE — Discharge Instructions (Addendum)
 Thank you for letting us  care for you during your stay.  You were admitted to the [facility] Medicine Teaching Service.   You were admitted for extra fluid which we think related to your heart and kidney.   We recommend follow up specifically for your heart and kidneys We recommend Cardiolology follow up  The only medication change is your metoprolol  to 75 mg twice daily instead of 100  BRING ALL OF YOUR MEDICATIONS TO YOUR NEXT VISIT  You were given a rollator   You have follow up scheduled with your family doctor, Dr. Janna.   If your symptoms worsen or return, please return to the hospital.  Please let us  know if you have questions about your stay at [facility].   Future Appointments  Date Time Provider Department Center  08/26/2023  9:20 AM James Ferrier, DO [facility]

## 2023-08-22 NOTE — Discharge Planning (Signed)
 [facility] Patient Discharge Orders- [facility] CLINIC: [facility]  Patient's name: [facility] Admit/DC Dates: 08/20/2023 - 08/22/2023  Discharge Diagnoses: Acute on chronic diastolic HF   Dyspnea on exertion 2/2 volume overload  Aranesp : Given: no   Last Hgb: 9.6 PRBC's Given: no  ESA dose for discharge: no change IV Iron  dose at discharge: no change  Heparin  change: no change  EDW Change: yes -  89.3kg   [place]ath Change: no   Access intervention/Change: no Details:  Hectorol /Calcitriol  change: no  Discharge Labs: Calcium  10.2 Phosphorus 6.9 Albumin  3.2 K+ 4.0  IV Antibiotics: none Details:  On Coumadin?: no    OTHER/APPTS/LAB ORDERS:    D/C Meds to be reconciled by nurse after every discharge.  Completed By:   Reviewed by: MD:______ RN_______

## 2023-08-22 NOTE — Progress Notes (Signed)
 OT Cancellation Note  Patient Details Name: James Wall MRN: [id number] DOB: November 08, 1965   Cancelled Treatment:    Reason Eval/Treat Not Completed: (P) OT screened, no needs identified, will sign off, per PT Pt is independent, no OT needs, signing off.  [name] 08/22/2023, 11:25 AM

## 2023-08-22 NOTE — Assessment & Plan Note (Signed)
 EF 60-65% via last echo done 10/2022. Concern for worsening EF given current symptoms. - Echocardiogram as above - Receiving metoprolol  tartrate 75 mg twice daily, reduced from home 100mg  - Cardiology consulted, appreciate recommendations

## 2023-08-22 NOTE — TOC Transition Note (Signed)
 Transition of Care [facility]) - Discharge Note   Patient Details  Name: James Wall MRN: [id number] Date of Birth: 1965/02/26  Transition of Care [facility]) CM/SW Contact:  Robynn Eileen Hoose, RN Phone Number: 08/22/2023, 11:12 AM   Clinical Narrative:    Patient is being discharged today. Spoke with patient confirmed that he needs a rollator. Rollator ordered through Jermaine with [employer] to be delivered to bedside.   Final next level of care: Home/Self Care Barriers to Discharge: No Barriers Identified   Patient Goals and CMS Choice            Discharge Placement                       Discharge Plan and Services Additional resources added to the After Visit Summary for                  DME Arranged: Walker rolling with seat DME Agency: [employer] Date DME Agency Contacted: 08/22/23 Time DME Agency Contacted: 1112 Representative spoke with at DME Agency: [place]            Social Drivers of Health (SDOH) Interventions SDOH Screenings   Food Insecurity: No Food Insecurity (08/21/2023)  Housing: Unknown (08/21/2023)  Transportation Needs: No Transportation Needs (08/21/2023)  Utilities: Not At Risk (08/21/2023)  Alcohol Screen: Low Risk  (08/05/2022)  Depression (PHQ2-9): Low Risk  (01/07/2023)  Recent Concern: Depression (PHQ2-9) - High Risk (11/06/2022)  Financial Resource Strain: Low Risk  (08/05/2022)  Physical Activity: Inactive (08/05/2022)  Social Connections: Moderately Isolated (08/05/2022)  Stress: No Stress Concern Present (08/05/2022)  Tobacco Use: High Risk (08/20/2023)  Health Literacy: Adequate Health Literacy (01/20/2023)     Readmission Risk Interventions     No data to display

## 2023-08-22 NOTE — Assessment & Plan Note (Signed)
 Clinically stable with normal WOB on RA.  - Telemetry given bradycardia - Echocardiogram showing: LVEF 60-65%. No wall abnormalities. LV mildly dilated, mild concentric LV hypertrophy. GIIDD. RA pressure elevated. Severely elevated PA systolic pressure. LA moderately dilated. Mild mitral regurg.    - Labs: AM RFP, Mag, CBC - 7/19AM labs stable - PT/OT eval and treat in setting of new dyspnea on exertion

## 2023-08-23 ENCOUNTER — Telehealth: Payer: Self-pay | Admitting: Nephrology

## 2023-08-23 NOTE — Telephone Encounter (Signed)
 Transition of Care Contact from Inpatient Facility   Date of Discharge: 08/22/23 Date of Contact: 08/23/23 Method of contact: phone Talked to patient   Patient contacted to discuss transition of care form recent hospitaliztion. Patient was admitted to [facility] from 7/17 to 08/22/23 with the discharge diagnosis of dyspnea on exertion and bradycardia.   Medication changes were reviewed.   Patient will follow up with is outpatient dialysis center 08/24/23.  Other follow up needs include needs rolling walker, left prior to it being delivered to room   Manuelita Labella, PA-C [employer]

## 2023-08-24 ENCOUNTER — Telehealth: Payer: Self-pay | Admitting: *Deleted

## 2023-08-24 ENCOUNTER — Telehealth: Payer: Self-pay

## 2023-08-24 ENCOUNTER — Other Ambulatory Visit: Payer: Self-pay | Admitting: *Deleted

## 2023-08-24 DIAGNOSIS — E8770 Fluid overload, unspecified: Secondary | ICD-10-CM | POA: Diagnosis not present

## 2023-08-24 DIAGNOSIS — N186 End stage renal disease: Secondary | ICD-10-CM

## 2023-08-24 DIAGNOSIS — D631 Anemia in chronic kidney disease: Secondary | ICD-10-CM | POA: Diagnosis not present

## 2023-08-24 DIAGNOSIS — R0609 Other forms of dyspnea: Secondary | ICD-10-CM | POA: Diagnosis not present

## 2023-08-24 DIAGNOSIS — D689 Coagulation defect, unspecified: Secondary | ICD-10-CM | POA: Diagnosis not present

## 2023-08-24 DIAGNOSIS — N2581 Secondary hyperparathyroidism of renal origin: Secondary | ICD-10-CM | POA: Diagnosis not present

## 2023-08-24 DIAGNOSIS — I110 Hypertensive heart disease with heart failure: Secondary | ICD-10-CM

## 2023-08-24 DIAGNOSIS — T8249XA Other complication of vascular dialysis catheter, initial encounter: Secondary | ICD-10-CM | POA: Diagnosis not present

## 2023-08-24 DIAGNOSIS — Z992 Dependence on renal dialysis: Secondary | ICD-10-CM | POA: Diagnosis not present

## 2023-08-24 DIAGNOSIS — D509 Iron deficiency anemia, unspecified: Secondary | ICD-10-CM | POA: Diagnosis not present

## 2023-08-24 NOTE — Progress Notes (Signed)
 Late Note Entry- August 24, 2023  Pt was d/c on Saturday. Contacted [facility] this morning to be advised of pt's d/c date and that pt should resume care today.   Randine Mungo Renal Navigator [phone]

## 2023-08-24 NOTE — Progress Notes (Signed)
 Complex Care Management Note  Care Guide Note 08/24/2023 Name: James Wall MRN: [id number] DOB: [birth year]  James Wall is a 58 y.o. year old male who sees Janna Ferrier, DO for primary care. I reached out to [name] by phone today to offer complex care management services.  [name] was given information about Complex Care Management services today including:   The Complex Care Management services include support from the care team which includes your Nurse Care Manager, Clinical Social Worker, or Pharmacist.  The Complex Care Management team is here to help remove barriers to the health concerns and goals most important to you. Complex Care Management services are voluntary, and the patient may decline or stop services at any time by request to their care team member.   Complex Care Management Consent Status: Patient agreed to services and verbal consent obtained.   Follow up plan:  Telephone appointment with complex care management team member scheduled for:  08/24/23  Encounter Outcome:  Patient Scheduled  Harlene Satterfield  [facility]  [facility], [facility] Guide  Direct Dial: [phone]  Fax [phone]

## 2023-08-24 NOTE — Patient Outreach (Addendum)
  Patient: James Wall PCP: [facility] (Dr. Janna)  RNCM received a request to contact pt due to missing oxygen that was supposed to be ordered prior to discharged from the hospital on 08/22/2023.    RNCM spoke with the pt in detail concerning his immediate needs at this time. Pt states he was suppose to received a rollator (with a seat) and O2 system upon his recent discharged from the hospital. Pt admits he left the hospital prior to receiving these items.   After review of EPIC notes and d/c summary there was no indication or orders for home oxygen and pt's sats on during hospitalization via flowsheet indicated 98%-100% with the last reading on d/c of 99% on room air. When discussing this information pt became very upset and anxious with his request for home O2. RNCM offered to contact the provider's office with his requested for further interventions. Pt has a pending appointment on 7/23 with his PCP however unable to make this appointment due to his dialysis (receptive to [facility] outreaching to his PCP office).  On three-way RNCM/patient/triage nurse Osie) discussed pt's issues as HIPAA was verified by patient on this call.  Triage nurse states pt was scheduled for testing for home O2  pending appointments for this Wednesday 08/26/2023 due to the possible criteria to be eligible by insurance or out-of-pocket expense however pt declined (dialysis day).  Appointment was scheduled on Thursday 08/27/2023 @ 930.    [facility] RNCM offered a follow up call with another RNCM not to upset the patient further.  Pt was receptive Monday next week. Pt also indicated he did not receive his orders rolling with a seat that was supposed to be delivered to bedside. [employer] needs to be called as the pt again left the hospital before DME was delivered. DME agency may have picked up the DME for a home delivery. Please verified with the next outreach call.   No other needs presented at this time.  Will requested care-guide to scheduled with another RNCM to complete the initial assessment on this patient next week and follow up on the home O2 qualification.  Will route this note to PCP.    Olam Ku, RN, BSN   [facility], [facility] RN Care Manager Direct Dial: [phone]  Fax: [phone]

## 2023-08-24 NOTE — Telephone Encounter (Signed)
 Olam with [facility] calls nurse line with patient on the call.   Patient voiced frustration over DME oxygen process. He reports he was in the hospital again over the weekend for shortness of breath.   DME order was never placed at visit last week. Advised on insurance requirements and the need for office documentation. Advised it appears he has an apt on 7/23.  Patient became agitated over this apt. He reports he has dialysis on Wednesdays and did not understand why this apt was made. Patient was speaking in full sentences and did not appear to be in distress.   Rescheduled apt for 7/24.

## 2023-08-24 NOTE — Progress Notes (Signed)
 Complex Care Management Note  Care Guide Note 08/24/2023 Name: James Wall MRN: [id number] DOB: 1965/02/12  James Wall is a 58 y.o. year old male who sees Janna Ferrier, DO for primary care. I reached out to [name] by phone today to offer complex care management services.  James Wall was given information about Complex Care Management services today including:   The Complex Care Management services include support from the care team which includes your Nurse Care Manager, Clinical Social Worker, or Pharmacist.  The Complex Care Management team is here to help remove barriers to the health concerns and goals most important to you. Complex Care Management services are voluntary, and the patient may decline or stop services at any time by request to their care team member.   Complex Care Management Consent Status: Patient did not agree to participate in complex care management services at this time. With LCSW   Follow up plan:  None  Encounter Outcome:  Patient Refused  Harlene Satterfield  [facility]  [facility] Guide  Direct Dial: [phone]  Fax [phone]

## 2023-08-26 ENCOUNTER — Ambulatory Visit: Admitting: Family Medicine

## 2023-08-26 DIAGNOSIS — D509 Iron deficiency anemia, unspecified: Secondary | ICD-10-CM | POA: Diagnosis not present

## 2023-08-26 DIAGNOSIS — N2581 Secondary hyperparathyroidism of renal origin: Secondary | ICD-10-CM | POA: Diagnosis not present

## 2023-08-26 DIAGNOSIS — D631 Anemia in chronic kidney disease: Secondary | ICD-10-CM | POA: Diagnosis not present

## 2023-08-26 DIAGNOSIS — T8249XA Other complication of vascular dialysis catheter, initial encounter: Secondary | ICD-10-CM | POA: Diagnosis not present

## 2023-08-26 DIAGNOSIS — N186 End stage renal disease: Secondary | ICD-10-CM | POA: Diagnosis not present

## 2023-08-26 DIAGNOSIS — D689 Coagulation defect, unspecified: Secondary | ICD-10-CM | POA: Diagnosis not present

## 2023-08-26 DIAGNOSIS — Z992 Dependence on renal dialysis: Secondary | ICD-10-CM | POA: Diagnosis not present

## 2023-08-27 ENCOUNTER — Other Ambulatory Visit: Payer: Self-pay

## 2023-08-27 ENCOUNTER — Encounter: Payer: Self-pay | Admitting: Student

## 2023-08-27 ENCOUNTER — Other Ambulatory Visit: Payer: Self-pay | Admitting: *Deleted

## 2023-08-27 ENCOUNTER — Encounter: Payer: Self-pay | Admitting: *Deleted

## 2023-08-27 ENCOUNTER — Ambulatory Visit (INDEPENDENT_AMBULATORY_CARE_PROVIDER_SITE_OTHER): Admitting: Student

## 2023-08-27 VITALS — BP 140/78 | HR 78 | Ht 65.0 in | Wt 197.6 lb

## 2023-08-27 DIAGNOSIS — I110 Hypertensive heart disease with heart failure: Secondary | ICD-10-CM

## 2023-08-27 DIAGNOSIS — J439 Emphysema, unspecified: Secondary | ICD-10-CM | POA: Diagnosis not present

## 2023-08-27 DIAGNOSIS — N186 End stage renal disease: Secondary | ICD-10-CM

## 2023-08-27 MED ORDER — ALBUTEROL SULFATE HFA 108 (90 BASE) MCG/ACT IN AERS: 2.0000 | INHALATION_SPRAY | Freq: Four times a day (QID) | RESPIRATORY_TRACT | 2 refills | Status: AC | PRN

## 2023-08-27 MED ORDER — STIOLTO RESPIMAT 2.5-2.5 MCG/ACT IN AERS: 2.0000 | INHALATION_SPRAY | Freq: Every day | RESPIRATORY_TRACT | 2 refills | Status: AC

## 2023-08-27 NOTE — Progress Notes (Signed)
    SUBJECTIVE:   CHIEF COMPLAINT / HPI:   COPD-B Shortness of breath with ADLs including getting dressed, completing chores.  No recent diagnosed exacerbations.  Spirometry in 2015 showing ratio 0.55, FEV1 1.63L (58% pred).  Previously seen by pulmonology, but no recent visits in the last several years.  No formal PFTs were done, although they were ordered many years ago.  He reports he was on inhalers many years ago, but discontinued them 2/2 cough.  Patient has quit smoking for 4 weeks, congratulated patient and encouraged continued cessation.  Discussed options for inhalers, patient agreeable to trying.  He does not like powders, we will try mist.  Patient would like home oxygen 2/2 shortness of breath.  He did not qualify on amatory pulse ox today, only desatting to 93%.  Discussed that his symptoms may improve with inhaler treatment, and he is agreeable to try.  HTN Reports compliance with his blood pressure medications.  Otherwise asymptomatic today.  He did receive HD yesterday.   OBJECTIVE:   BP (!) 140/78   Pulse 78   Ht 5' 5 (1.651 m)   Wt 197 lb 9.6 oz (89.6 kg)   SpO2 98%   BMI 32.88 kg/m    General: NAD, pleasant Cardio: RRR, no MRG. Cap Refill <2s. Respiratory: CTAB, prolonged expiratory phase, normal wob on RA Skin: Warm and dry   ASSESSMENT/PLAN:   Assessment & Plan Pulmonary emphysema, unspecified emphysema type (HCC) Suspect primary cause of his SOB.  Without other symptoms, exam findings and stable vitals-lower concern for lower respiratory infection, pulmonary embolism. - Start Stiolto Respimat  2 puffs daily - Albuterol  2 puffs every 6 hours as needed - Follow-up in 2 weeks Hypertensive heart disease with heart failure (HCC) - Follows closely with nephrology - Asymptomatic today - Difficult to control blood pressure 2/2 ESRD on HD and CHF - Continue clonidine , hydralazine , isosorbide  dinitrate - Follow-up in 2 weeks    Gladis Church, DO [facility]  Health [facility]

## 2023-08-27 NOTE — Assessment & Plan Note (Signed)
-   Follows closely with nephrology - Asymptomatic today - Difficult to control blood pressure 2/2 ESRD on HD and CHF - Continue clonidine , hydralazine , isosorbide  dinitrate - Follow-up in 2 weeks

## 2023-08-27 NOTE — Patient Instructions (Addendum)
 It was great to see you! Thank you for allowing me to participate in your care!   I recommend that you always bring your medications to each appointment as this makes it easy to ensure we are on the correct medications and helps us  not miss when refills are needed.  Our plans for today:  - Take 2 puffs once a day of the inhaler Tiotropium Bromide-Olodaterol (STIOLTO RESPIMAT . TAKE THIS EVERY DAY. - If you still need more help with breathing despite taking your tiotropium bromide inhaler, please take albuterol  2 puffs every 4 hours as needed for breathing. - Follow-up in 2 weeks for recheck  Take care and seek immediate care sooner if you develop any concerns. Please remember to show up 15 minutes before your scheduled appointment time!  Gladis Church, DO [facility]

## 2023-08-27 NOTE — Assessment & Plan Note (Signed)
 Suspect primary cause of his SOB.  Without other symptoms, exam findings and stable vitals-lower concern for lower respiratory infection, pulmonary embolism. - Start Stiolto Respimat  2 puffs daily - Albuterol  2 puffs every 6 hours as needed - Follow-up in 2 weeks

## 2023-08-27 NOTE — Patient Outreach (Signed)
 Complex Care Management   Visit Note  08/27/2023  Name:  James Wall MRN: [id number] DOB: 1965-06-26  Situation: Referral received for Complex Care Management related to Heart Failure, COPD, and ESRD I obtained verbal consent from Patient.  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 [facility])    primary cancer is prostate   Anemia associated with chronic renal failure    Anxiety    Arthritis    CHF (congestive heart failure) (HCC)    Cirrhosis, nonalcoholic (HCC)    per pt possible from a medication , unsure ;   last ultrasound 08-09-2020 in epic no fibrosis   COPD (chronic obstructive pulmonary disease) (HCC)    Depression    ESRD on hemodialysis (HCC) 2009   Started HD Jan 2009;  ESRD secondary to hypertensive nephrosclerosis;  dialysis at [facility] on MWF   First degree heart block    GERD (gastroesophageal reflux disease)    Hiatal hernia    History of acute respiratory failure 07/2013   admission;  HCAP w/ ARF with hypoxia   History of adenomatous polyp of colon    History of ascites    s/p paracentesis 01-31-2013 (5L) and last one 03-28-2013 (2.7L)   History of community acquired pneumonia 08/08/2020   admission ; LLL , POA   History of heart murmur in childhood    History of MRSA infection 12/19/2012   hospital admission;  w/ sepsis MRSA bacteramia AVF infection   History of sepsis 03/2017   admission;   HCAP w/ sepsis   Hyperlipidemia    Hypertension    Hypertensive heart disease    cardiologist--- dr emerson leavens;  nuclear stress test 06-16-2013 intermediate risk w/ mid-distal anterior wall ischemia , not gated;  cardiac cath 07-13-2013 in epic showed normal coronaries and LVSF,  ef not assessed, LCEDP   Hypertensive nephrosclerosis, 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  (HCC)    Intolerance to cold    due to anemia   Malignant neoplasm prostate [facility])    urologist--- dr bell/  radiation onologist--- dr patrcia;  dx 01/ 2023,  Gleason 4+3, PSA 32   Nausea and vomiting 10/19/2022   NICM (nonischemic cardiomyopathy) (HCC)    followed by cardiology;   last echo in epic 09-13-2020 ef 55-60%   OSA (obstructive sleep apnea) 2009   no  longer using cpap since the yr started 2009;   sleep study in epic 05-11-2007 severe osa   Pneumonia    PSVT (paroxysmal supraventricular tachycardia) (HCC)    event monitor 08-01-2019  SR w/ SVT runs , rare PAC/ PVC   Secondary hyperparathyroidism (HCC)    Stroke (HCC)    2023 weakness Lhand and Left leg   Wears glasses     Assessment: Patient Reported Symptoms:  Cognitive Cognitive Status: Alert and oriented to person, place, and time, Normal speech and language skills, No symptoms reported Cognitive/Intellectual Conditions Management [RPT]: None reported or documented in medical history or problem list   Health Maintenance Behaviors: Annual physical exam Healing Pattern: Unsure Health Facilitated by: Rest  Neurological Neurological Review of Symptoms: No symptoms reported    HEENT HEENT Symptoms Reported: No symptoms reported      Cardiovascular Cardiovascular Symptoms Reported: No symptoms reported Cardiovascular Comment: Needs cardiology F/U per discharge instructions. Staff message sent  to cardiology office requesting outreach and scheduling. On recall list for 1 year F/U in October.  Respiratory Respiratory Symptoms Reported: Shortness of breath Other Respiratory Symptoms: SOB with exertion Additional Respiratory Details: managed by PCP. last visit yesterday. did not qualify for O2. Prescribed new inhalers and he has purchased those. Respiratory Management Strategies: Adequate rest, Breathing exercise, Medication therapy Respiratory Self-Management Outcome: 4 (good)  Endocrine Endocrine Symptoms Reported: No  symptoms reported Is patient diabetic?: No    Gastrointestinal Gastrointestinal Symptoms Reported: No symptoms reported      Genitourinary Genitourinary Symptoms Reported: No symptoms reported Genitourinary Management Strategies: Hemodialysis Hemodialysis Schedule: Monday, Wednesday, Friday Hemodialysis Last Treatment: 08/26/23 Genitourinary Self-Management Outcome: 4 (good) Genitourinary Comment: has been on HD for 16 years and feels that he's doing well  Integumentary Integumentary Symptoms Reported: No symptoms reported    Musculoskeletal Musculoskelatal Symptoms Reviewed: Difficulty walking, Unsteady gait Musculoskeletal Management Strategies: Adequate rest, Medical device Musculoskeletal Self-Management Outcome: 4 (good) Musculoskeletal Comment: Using 4 point cane. Order for rolling walker but did not receive it prior to hospital discharge. Order sent to [facility] and was supposed to be delivered to home. Patient would like it delivered to his sister's home instead. RN Care Manager outreach to [facility] to F/U on order. Falls in the past year?: No Number of falls in past year: 1 or less Was there an injury with Fall?: No Fall Risk Category Calculator: 0 Patient Fall Risk Level: Low Fall Risk Patient at Risk for Falls Due to: Impaired balance/gait, Impaired mobility Fall risk Follow up: Falls prevention discussed, Falls evaluation completed  Psychosocial Psychosocial Symptoms Reported: No symptoms reported Behavioral Management Strategies: Adequate rest, Support system Major Change/Loss/Stressor/Fears (CP): Denies Techniques to Cope with Loss/Stress/Change: Spiritual practice(s) Quality of Family Relationships: helpful, involved, supportive Do you feel physically threatened by others?: No      08/27/2023    9:30 AM  Depression screen PHQ 2/9  Decreased Interest 1  Down, Depressed, Hopeless 1  PHQ - 2 Score 2  Altered sleeping 1  Tired, decreased energy 3  Change in appetite 1   Feeling bad or failure about yourself  1  Trouble concentrating 1  Moving slowly or fidgety/restless 1  Suicidal thoughts 0  PHQ-9 Score 10  Difficult doing work/chores Somewhat difficult    There were no vitals filed for this visit.  Medications Reviewed Today     Reviewed by Charlsie Josette SAILOR, RN (Registered Nurse) on 08/27/23 at 1652  Med List Status: <None>   Medication Order Taking? Sig Documenting Provider Last Dose Status Informant  acetaminophen  (TYLENOL ) 650 MG CR tablet [id number] Yes Take 650 mg by mouth every 8 (eight) hours as needed for pain. Pt takes 2 tablet as needed. [provider]  Active Self, Pharmacy Records  albuterol  (VENTOLIN  HFA) 108 [phone] Base) MCG/ACT inhaler [id number] Yes Inhale 2 puffs into the lungs every 6 (six) hours as needed for wheezing or shortness of breath. Howell Lunger, DO  Active   amLODipine  (NORVASC ) 10 MG tablet [id number] Yes Take 1 tablet (10 mg total) by mouth at bedtime. Santo Stanly LABOR, MD  Active Self, Pharmacy Records  cloNIDine  (CATAPRES  - DOSED IN MG/24 HR) 0.3 mg/24hr patch [id number] Yes Place 1 patch (0.3 mg total) onto the skin once a week on Sunday. Santo Stanly LABOR, MD  Active Self, Pharmacy Records           Med Note [facility] EVERN DEVERE LABOR [place] Oct 19, 2022  8:21 PM)  hydrALAZINE  (APRESOLINE ) 50 MG tablet [id number] Yes Take 1 tablet (50 mg total) by mouth 2 (two) times daily. Santo Stanly LABOR, MD  Active Self, Pharmacy Records  hydrOXYzine  (ATARAX ) 25 MG tablet [id number] Yes Take 25 mg by mouth daily. [provider]  Active Self, Pharmacy Records  isosorbide  dinitrate (ISORDIL ) 20 MG tablet [id number] Yes Take 1 tablet (20 mg total) by mouth 2 (two) times daily. Santo Stanly LABOR, MD  Active Self, Pharmacy Records  latanoprost  (XALATAN ) 0.005 % ophthalmic solution [id number] Yes Apply to eye. [provider]  Active Self, Pharmacy Records  Metoprolol  Tartrate 75 MG TABS  [id number] Yes Take 1 tablet (75 mg total) by mouth 2 (two) times daily. Delores Suzann HERO, MD  Active   pantoprazole  (PROTONIX ) 20 MG tablet [id number] Yes TAKE 1 TABLET(20 MG) BY MOUTH DAILY Dahbura, Anton, DO  Active Self, Pharmacy Records  rosuvastatin  (CRESTOR ) 10 MG tablet [id number] Yes Take 1 tablet (10 mg total) by mouth daily. Santo Stanly LABOR, MD  Active Self, Pharmacy Records  senna (SENOKOT) 8.6 MG TABS tablet [id number] Yes Take 1 tablet (8.6 mg total) by mouth daily. Everhart, Kirstie, DO  Active Self, Pharmacy Records  SENSIPAR  90 MG tablet [id number] Yes Take 90 mg by mouth daily. [provider]  Active Self, Pharmacy Records  sevelamer  carbonate (RENVELA ) 800 MG tablet [id number] Yes Take 4 tablets (3,200 mg total) by mouth 3 (three) times daily with meals. Pegge Toribio PARAS, PA-C  Active Self, Pharmacy Records  tamsulosin  (FLOMAX ) 0.4 MG CAPS capsule [id number] Yes TAKE 2 CAPSULES(0.8 MG) BY MOUTH DAILY Dahbura, Anton, DO  Active Self, Pharmacy Records  Tiotropium Bromide-Olodaterol (STIOLTO RESPIMAT ) 2.5-2.5 MCG/ACT AERS [id number] Yes Inhale 2 puffs into the lungs daily. Howell Lunger, DO  Active   Med List Note Mylo Powell CROME, CPhT 03/08/17 1642): Dialysis Monday, Wednesday, Friday - [address] [place] [phone]            Recommendation:   Specialty provider follow-up Cardiologist per discharge instructions Obtain rolling walker from [facility] Keep appointment with podiatrist on 09/03/23  Follow Up Plan:   Telephone follow-up in 1 day  Josette Pellet, RN, BSN [place]  [facility] Health RN Care Manager Direct Dial: [phone]  Fax: [phone]

## 2023-08-28 ENCOUNTER — Encounter: Payer: Self-pay | Admitting: *Deleted

## 2023-08-28 ENCOUNTER — Telehealth: Payer: Self-pay | Admitting: *Deleted

## 2023-08-28 DIAGNOSIS — D509 Iron deficiency anemia, unspecified: Secondary | ICD-10-CM | POA: Diagnosis not present

## 2023-08-28 DIAGNOSIS — N186 End stage renal disease: Secondary | ICD-10-CM | POA: Diagnosis not present

## 2023-08-28 DIAGNOSIS — Z992 Dependence on renal dialysis: Secondary | ICD-10-CM | POA: Diagnosis not present

## 2023-08-28 DIAGNOSIS — T8249XA Other complication of vascular dialysis catheter, initial encounter: Secondary | ICD-10-CM | POA: Diagnosis not present

## 2023-08-28 DIAGNOSIS — N2581 Secondary hyperparathyroidism of renal origin: Secondary | ICD-10-CM | POA: Diagnosis not present

## 2023-08-28 DIAGNOSIS — D689 Coagulation defect, unspecified: Secondary | ICD-10-CM | POA: Diagnosis not present

## 2023-08-28 DIAGNOSIS — D631 Anemia in chronic kidney disease: Secondary | ICD-10-CM | POA: Diagnosis not present

## 2023-08-31 DIAGNOSIS — D509 Iron deficiency anemia, unspecified: Secondary | ICD-10-CM | POA: Diagnosis not present

## 2023-08-31 DIAGNOSIS — D631 Anemia in chronic kidney disease: Secondary | ICD-10-CM | POA: Diagnosis not present

## 2023-08-31 DIAGNOSIS — D689 Coagulation defect, unspecified: Secondary | ICD-10-CM | POA: Diagnosis not present

## 2023-08-31 DIAGNOSIS — N2581 Secondary hyperparathyroidism of renal origin: Secondary | ICD-10-CM | POA: Diagnosis not present

## 2023-08-31 DIAGNOSIS — T8249XA Other complication of vascular dialysis catheter, initial encounter: Secondary | ICD-10-CM | POA: Diagnosis not present

## 2023-08-31 DIAGNOSIS — N186 End stage renal disease: Secondary | ICD-10-CM | POA: Diagnosis not present

## 2023-08-31 DIAGNOSIS — Z992 Dependence on renal dialysis: Secondary | ICD-10-CM | POA: Diagnosis not present

## 2023-09-01 ENCOUNTER — Telehealth: Payer: Self-pay

## 2023-09-01 ENCOUNTER — Ambulatory Visit: Admitting: Student

## 2023-09-01 NOTE — Telephone Encounter (Signed)
 Lonell NP and patient call nurse line together to report abnormal blood pressure.   Lonell reports his BP is 194/100. She reports the patient is asymptomatic.   I spoke with patient and he denies any chest pains, shortness of breath, headaches or vision changes.   NP is requesting approval for him take his bedtime BP medication now.   Patient advised to schedule a FU apt as well, as he missed his apt this morning. Patient declined stating it was not time for him to come in yet.  Per chart review, Cardiology manages BP meds.   Advised ED precautions.   Will forward to PCP for advisement.

## 2023-09-02 ENCOUNTER — Other Ambulatory Visit: Payer: Self-pay

## 2023-09-02 DIAGNOSIS — D689 Coagulation defect, unspecified: Secondary | ICD-10-CM | POA: Diagnosis not present

## 2023-09-02 DIAGNOSIS — T8249XA Other complication of vascular dialysis catheter, initial encounter: Secondary | ICD-10-CM | POA: Diagnosis not present

## 2023-09-02 DIAGNOSIS — Z992 Dependence on renal dialysis: Secondary | ICD-10-CM | POA: Diagnosis not present

## 2023-09-02 DIAGNOSIS — D631 Anemia in chronic kidney disease: Secondary | ICD-10-CM | POA: Diagnosis not present

## 2023-09-02 DIAGNOSIS — N2581 Secondary hyperparathyroidism of renal origin: Secondary | ICD-10-CM | POA: Diagnosis not present

## 2023-09-02 DIAGNOSIS — N186 End stage renal disease: Secondary | ICD-10-CM | POA: Diagnosis not present

## 2023-09-02 DIAGNOSIS — D509 Iron deficiency anemia, unspecified: Secondary | ICD-10-CM | POA: Diagnosis not present

## 2023-09-02 NOTE — Patient Instructions (Signed)
 Visit Information  Thank you for taking time to visit with me today. Please don't hesitate to contact me if I can be of assistance to you before our next scheduled appointment.  Our next appointment is by telephone on 09/16/2023 at 11:30AM Please call the care guide team at [phone] if you need to cancel or reschedule your appointment.   Following is a copy of your care plan:   Goals Addressed             This Visit's Progress    BSW VBCI Social Work Care Plan   On track    Problems:   Patient reports no SDOH barriers at this time. Patient states he needs help obtaining a rolling walker with a seat that was ordered for him while in the hospital recently.  CSW Clinical Goal(s):   Over the next 2 weeks the Patient will work with Child psychotherapist to address concerns related to rolling walker.  Interventions:  BSW will follow up with nurses regarding rolling walker order per chart review.   Patient Goals/Self-Care Activities:  None  Plan:   Telephone follow up appointment with care management team member scheduled for:  09/16/2023 at 11:30AM        Please call the Suicide and Crisis Lifeline: 988 go to [facility] [address], [place] [phone]) call 911 if you are experiencing a Mental Health or Behavioral Health Crisis or need someone to talk to.  Patient verbalizes understanding of instructions and care plan provided today and agrees to view in MyChart. Active MyChart status and patient understanding of how to access instructions and care plan via MyChart confirmed with patient.     Laymon Doll, BSW /VBCI - [employer] Social Worker [phone]

## 2023-09-02 NOTE — Patient Outreach (Signed)
 Complex Care Management   Visit Note  09/02/2023  Name:  James Wall MRN: [id number] DOB: 1965-10-11  Situation: Referral received for Complex Care Management related to rolling walker need I obtained verbal consent from Patient.  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;  dialysis at [facility] on MWF   First degree heart block    GERD (gastroesophageal reflux disease)    Hiatal hernia    History of acute respiratory failure 07/2013   admission;  HCAP w/ ARF with hypoxia   History of adenomatous polyp of colon    History of ascites    s/p paracentesis 01-31-2013 (5L) and last one 03-28-2013 (2.7L)   History of community acquired pneumonia 08/08/2020   admission ; LLL , POA   History of heart murmur in childhood    History of MRSA infection 12/19/2012   hospital admission;  w/ sepsis MRSA bacteramia AVF infection   History of sepsis 03/2017   admission;   HCAP w/ sepsis   Hyperlipidemia    Hypertension    Hypertensive heart disease    cardiologist--- dr emerson leavens;  nuclear stress test 06-16-2013 intermediate risk w/ mid-distal anterior wall ischemia , not gated;  cardiac cath 07-13-2013 in epic showed normal coronaries and LVSF,  ef not assessed, LCEDP   Hypertensive nephrosclerosis, 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)    Intolerance to  cold    due to anemia   Malignant neoplasm prostate [facility])    urologist--- dr bell/  radiation onologist--- dr patrcia;  dx 01/ 2023,  Gleason 4+3, PSA 32   Nausea and vomiting 10/19/2022   NICM (nonischemic cardiomyopathy) (HCC)    followed by cardiology;   last echo in epic 09-13-2020 ef 55-60%   OSA (obstructive sleep apnea) 2009   no  longer using cpap since the yr started 2009;   sleep study in epic 05-11-2007 severe osa   Pneumonia    PSVT (paroxysmal supraventricular tachycardia) (HCC)    event monitor 08-01-2019  SR w/ SVT runs , rare PAC/ PVC   Secondary hyperparathyroidism (HCC)    Stroke (HCC)    2023 weakness Lhand and Left leg   Wears glasses     Assessment: BSW held initial call with patient. Patient was alert and cognitive. SDOH were assessed and no SDOH barriers/needs were identified at this time. Patient also confirmed he was doing good and that he did not need any information/resources at this time. Patient did request assist with obtaining a rolling walker with a seat. Per chart review, a rolling walker with a seat was ordered when patient was recently in the hospital. However, patient was discharged from hospital and never received it. Per chart review, walker order was changed to home delivery but paint claims to not have received  anything yet. BSW will message nurses involved in his situation to further details and f/u with patient. Patient understood and agreed. No other resources were provided/requested at this time. Patient was given BSW phone number should something come up during the next two weeks.  SDOH Interventions    Flowsheet Row Patient Outreach Telephone from 08/27/2023 in [place] POPULATION HEALTH DEPARTMENT ED to Hosp-Admission (Discharged) from 08/20/2023 in  [facility]  SURGICAL Care Coordination from 03/20/2023 in [facility] Community Care Coordination Patient Outreach from 02/24/2023 in [place] POPULATION  HEALTH DEPARTMENT Care Coordination from 02/06/2023 in [facility] Community Care Coordination Patient Outreach from 01/20/2023 in [place] POPULATION HEALTH DEPARTMENT  SDOH Interventions        Food Insecurity Interventions Intervention Not Indicated Intervention Not Indicated Intervention Not Indicated Intervention Not Indicated Intervention Not Indicated --  Housing Interventions Intervention Not Indicated Intervention Not Indicated Intervention Not Indicated Intervention Not Indicated Intervention Not Indicated  [Mr. Samples reports he secured stable housing] --  Transportation Interventions SCAT (Specialized Community Area [employer]ation), Intervention Not Indicated, Patient Resources Dietitian) Intervention Not Indicated Intervention Not Indicated -- -- --  Utilities Interventions Intervention Not Indicated Intervention Not Indicated Intervention Not Indicated Intervention Not Indicated Intervention Not Indicated --  Alcohol Usage Interventions Intervention Not Indicated (Score <7) -- -- -- -- --  Financial Strain Interventions Intervention Not Indicated -- -- -- -- --  Physical Activity Interventions Patient Declined -- -- -- -- --  Social Connections Interventions Intervention Not Indicated -- -- -- -- --  Health Literacy Interventions Intervention Not Indicated -- -- -- -- Intervention Not Indicated      Recommendation:   none  Follow Up Plan:   Telephone follow up appointment date/time:  09/16/2023 at 11:30AM  Laymon Doll, BSW /VBCI - [employer] Social Worker [phone]

## 2023-09-03 ENCOUNTER — Ambulatory Visit: Admitting: Podiatry

## 2023-09-03 DIAGNOSIS — Z992 Dependence on renal dialysis: Secondary | ICD-10-CM | POA: Diagnosis not present

## 2023-09-03 DIAGNOSIS — I12 Hypertensive chronic kidney disease with 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: Secondary | ICD-10-CM | POA: Diagnosis not present

## 2023-09-03 DIAGNOSIS — N186 End stage renal disease: Secondary | ICD-10-CM | POA: Diagnosis not present

## 2023-09-04 DIAGNOSIS — N2581 Secondary hyperparathyroidism of renal origin: Secondary | ICD-10-CM | POA: Diagnosis not present

## 2023-09-04 DIAGNOSIS — D509 Iron deficiency anemia, unspecified: Secondary | ICD-10-CM | POA: Diagnosis not present

## 2023-09-04 DIAGNOSIS — D689 Coagulation defect, unspecified: Secondary | ICD-10-CM | POA: Diagnosis not present

## 2023-09-04 DIAGNOSIS — Z992 Dependence on renal dialysis: Secondary | ICD-10-CM | POA: Diagnosis not present

## 2023-09-04 DIAGNOSIS — T8249XA Other complication of vascular dialysis catheter, initial encounter: Secondary | ICD-10-CM | POA: Diagnosis not present

## 2023-09-04 DIAGNOSIS — D631 Anemia in chronic kidney disease: Secondary | ICD-10-CM | POA: Diagnosis not present

## 2023-09-08 DIAGNOSIS — R310 Gross hematuria: Secondary | ICD-10-CM | POA: Diagnosis not present

## 2023-09-09 ENCOUNTER — Other Ambulatory Visit (HOSPITAL_COMMUNITY)

## 2023-09-09 DIAGNOSIS — N2581 Secondary hyperparathyroidism of renal origin: Secondary | ICD-10-CM | POA: Diagnosis not present

## 2023-09-09 DIAGNOSIS — D631 Anemia in chronic kidney disease: Secondary | ICD-10-CM | POA: Diagnosis not present

## 2023-09-09 DIAGNOSIS — Z992 Dependence on renal dialysis: Secondary | ICD-10-CM | POA: Diagnosis not present

## 2023-09-09 DIAGNOSIS — D509 Iron deficiency anemia, unspecified: Secondary | ICD-10-CM | POA: Diagnosis not present

## 2023-09-09 DIAGNOSIS — T8249XA Other complication of vascular dialysis catheter, initial encounter: Secondary | ICD-10-CM | POA: Diagnosis not present

## 2023-09-09 DIAGNOSIS — D689 Coagulation defect, unspecified: Secondary | ICD-10-CM | POA: Diagnosis not present

## 2023-09-09 DIAGNOSIS — N186 End stage renal disease: Secondary | ICD-10-CM | POA: Diagnosis not present

## 2023-09-14 DIAGNOSIS — D509 Iron deficiency anemia, unspecified: Secondary | ICD-10-CM | POA: Diagnosis not present

## 2023-09-15 DIAGNOSIS — R3915 Urgency of urination: Secondary | ICD-10-CM | POA: Diagnosis not present

## 2023-09-15 DIAGNOSIS — N186 End stage renal disease: Secondary | ICD-10-CM | POA: Diagnosis not present

## 2023-09-15 DIAGNOSIS — R310 Gross hematuria: Secondary | ICD-10-CM | POA: Diagnosis not present

## 2023-09-15 DIAGNOSIS — C778 Secondary and unspecified malignant neoplasm of lymph nodes of multiple regions: Secondary | ICD-10-CM | POA: Diagnosis not present

## 2023-09-16 ENCOUNTER — Other Ambulatory Visit: Payer: Self-pay

## 2023-09-16 DIAGNOSIS — N186 End stage renal disease: Secondary | ICD-10-CM | POA: Diagnosis not present

## 2023-09-21 DIAGNOSIS — D509 Iron deficiency anemia, unspecified: Secondary | ICD-10-CM | POA: Diagnosis not present

## 2023-09-23 DIAGNOSIS — D689 Coagulation defect, unspecified: Secondary | ICD-10-CM | POA: Diagnosis not present

## 2023-09-23 DIAGNOSIS — N186 End stage renal disease: Secondary | ICD-10-CM | POA: Diagnosis not present

## 2023-09-23 DIAGNOSIS — T8249XA Other complication of vascular dialysis catheter, initial encounter: Secondary | ICD-10-CM | POA: Diagnosis not present

## 2023-09-23 DIAGNOSIS — Z992 Dependence on renal dialysis: Secondary | ICD-10-CM | POA: Diagnosis not present

## 2023-09-23 DIAGNOSIS — D509 Iron deficiency anemia, unspecified: Secondary | ICD-10-CM | POA: Diagnosis not present

## 2023-09-23 DIAGNOSIS — N2581 Secondary hyperparathyroidism of renal origin: Secondary | ICD-10-CM | POA: Diagnosis not present

## 2023-09-24 ENCOUNTER — Other Ambulatory Visit: Payer: Self-pay

## 2023-09-24 NOTE — Patient Outreach (Signed)
 BSW conducted outreach to patient for scheduled f/u and was unsuccessful. Patient was provided with VM requesting to call back to reschedule. BSW will continue outreach efforts on 08/26 at 2:15pm

## 2023-09-25 DIAGNOSIS — Z992 Dependence on renal dialysis: Secondary | ICD-10-CM | POA: Diagnosis not present

## 2023-09-29 ENCOUNTER — Other Ambulatory Visit: Payer: Self-pay

## 2023-09-29 NOTE — Patient Instructions (Signed)

## 2023-09-29 NOTE — Patient Outreach (Signed)
 Complex Care Management   Visit Note  09/29/2023  Name:  James Wall MRN: [id number] DOB: [birth year]  Situation: Referral received for Complex Care Management related to obtaining rollator I obtained verbal consent from Patient.  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;  dialysis at [facility] on MWF   First degree heart block    GERD (gastroesophageal reflux disease)    Hiatal hernia    History of acute respiratory failure 07/2013   admission;  HCAP w/ ARF with hypoxia   History of adenomatous polyp of colon    History of ascites    s/p paracentesis 01-31-2013 (5L) and last one 03-28-2013 (2.7L)   History of community acquired pneumonia 08/08/2020   admission ; LLL , POA   History of heart murmur in childhood    History of MRSA infection 12/19/2012   hospital admission;  w/ sepsis MRSA bacteramia AVF infection   History of sepsis 03/2017   admission;   HCAP w/ sepsis   Hyperlipidemia    Hypertension    Hypertensive heart disease    cardiologist--- dr emerson leavens;  nuclear stress test 06-16-2013 intermediate risk w/ mid-distal anterior wall ischemia , not gated;  cardiac cath 07-13-2013 in epic showed normal coronaries and LVSF,  ef not assessed, LCEDP   Hypertensive nephrosclerosis, 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)    Intolerance to  cold    due to anemia   Malignant neoplasm prostate [facility])    urologist--- dr bell/  radiation onologist--- dr patrcia;  dx 01/ 2023,  Gleason 4+3, PSA 32   Nausea and vomiting 10/19/2022   NICM (nonischemic cardiomyopathy) (HCC)    followed by cardiology;   last echo in epic 09-13-2020 ef 55-60%   OSA (obstructive sleep apnea) 2009   no  longer using cpap since the yr started 2009;   sleep study in epic 05-11-2007 severe osa   Pneumonia    PSVT (paroxysmal supraventricular tachycardia) (HCC)    event monitor 08-01-2019  SR w/ SVT runs , rare PAC/ PVC   Secondary hyperparathyroidism (HCC)    Stroke (HCC)    2023 weakness Lhand and Left leg   Wears glasses     Assessment: BSW held f/u appt with pt. Pt was alert and cognitive. BSW informed patient he reached out to [employer] last week and this week regarding his rollator walker. Pt confirms he received his walker on September 20, 2023. Pt states he does not have any other needs at this time other than refilling one his inhalers. Pt was encouraged to reach out to PCP to request a refill to be ordered to his pharmacy. Pt stated he was going to wait to reach out until he sees his hear doctor on Thursday. No other resources were provided/requested at  this time and patient was informed on how to get connected to social work services in the future should his needs change. Patient understood and agreed.   SDOH Interventions    Flowsheet Row Patient Outreach Telephone from 08/27/2023 in [place] POPULATION HEALTH DEPARTMENT ED to Hosp-Admission (Discharged) from 08/20/2023 in [place] [facility]  SURGICAL Care Coordination from 03/20/2023 in [facility] Community Care Coordination Patient Outreach from 02/24/2023 in [place] POPULATION HEALTH DEPARTMENT Care Coordination from 02/06/2023 in [facility] Community Care Coordination Patient Outreach from 01/20/2023 in [place] POPULATION HEALTH DEPARTMENT  SDOH  Interventions        Food Insecurity Interventions Intervention Not Indicated Intervention Not Indicated Intervention Not Indicated Intervention Not Indicated Intervention Not Indicated --  Housing Interventions Intervention Not Indicated Intervention Not Indicated Intervention Not Indicated Intervention Not Indicated Intervention Not Indicated  [Mr. Stickler reports he secured stable housing] --  Transportation Interventions SCAT Psychologist, clinical), Intervention Not Indicated, Patient Resources Dietitian) Intervention Not Indicated Intervention Not Indicated -- -- --  Utilities Interventions Intervention Not Indicated Intervention Not Indicated Intervention Not Indicated Intervention Not Indicated Intervention Not Indicated --  Alcohol Usage Interventions Intervention Not Indicated (Score <7) -- -- -- -- --  Financial Strain Interventions Intervention Not Indicated -- -- -- -- --  Physical Activity Interventions Patient Declined -- -- -- -- --  Social Connections Interventions Intervention Not Indicated -- -- -- -- --  Health Literacy Interventions Intervention Not Indicated -- -- -- -- Intervention Not Indicated      Recommendation:   None  Follow Up Plan:   Patient has met all care management goals. Care Management case will be closed. Patient has been provided contact information should new needs arise.   Laymon Doll, BSW /VBCI - [employer] Social Worker [phone]

## 2023-09-29 NOTE — Patient Outreach (Signed)
 BSW called [employer] [facility] re rollator walker. BSW was informed that someone from the [place] location in [place] will reach out to BSW to provide an update. No other information was provided at this time.

## 2023-09-29 NOTE — Progress Notes (Unsigned)
 Cardiology Office Note    Date:  10/01/2023  ID:  James, Wall 1965-02-22, MRN [id number] PCP:  James Ferrier, DO  Cardiologist:  James DELENA Leavens, MD  Electrophysiologist:  None   Chief Complaint: Follow-up for dyspnea on exertion  History of Present Illness: .    James Wall is a 58 y.o. male with visit-pertinent history of COPD, tobacco use, ESRD on HD, hypertension, hyperlipidemia, cirrhosis, heart failure with improved EF, prostate cancer, PSVT, OSA, CVA in 09/2021.  Patient initially seen in 2015 by James Wall for exertional dyspnea and fatigue, Lexiscan  from 06/16/2013 was intermediate risk with mid to distal anterior wall ischemia.  Echo in 2014 with LVEF preserved.  He was arranged for cardiac catheterization but refused the procedure initially because he wished for general anesthesia to be used.  Patient had cardiac cath on 07/13/2013 showing no CAD, LVEDP 28.  He also was placed on Holter monitor due to palpitations at time which showed frequent PVCs.  Patient was then lost to follow-up, then again seen in 2019 for uncontrolled hypertension, hide metoprolol  was added for palpitations and PVCs.  Patient hospitalized in 12/2017 for hypertensive urgency.  Echo at that time showed LVEF down to 45 to 50%, moderate MAC, mild to moderate MR, moderate TR, severe pulmonary hypertension, PA.  Pressure 97 mmHg.  Cardiology was consulted for sinus bradycardia, reduce metoprolol  dosing and recommended sleep evaluation.  Left and right heart cath was recommended but refused to the procedure due to wish of using general anesthesia.  He was treated medically for CHF and continued on dialysis for volume management.  Cardiac event monitor in 03/2019 showed few very short runs of SVT and rare PVCs, PACs and 1 blocked sinus beat.  Patient transition care to James Wall in 2022.  Echo at that time showed LVEF improved to 55 to 60%, no RWMA, G1 DD, moderate LVH, normal RV, mildly  elevated PASP, severe LAE, mild MR, mild aortic sclerosis.  He was on multiple antihypertensives for difficult to control hypertension, was on hydralazine  and Imdur  for GDMT and metoprolol  for PSVT and PVCs.  Patient was noted to have stroke on MRI in 2023 and was placed on Plavix .  He was diagnosed prostate cancer and completed radiation in 2023 as well.  Patient was last seen on 10/22/2021 and was doing well at time.  2 weeks ZIO was repeated in 11/2022 showed nonsustained SVT and NSVT, rare supraventricular ectopy.  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  Echo from 10/21/2022 showed LVEF 60 to 65%, no RWMA, moderate LVH of the basal septal segment, grade 2 diastolic dysfunction, normal RV, trivial MR, aortic sclerosis.  Patient was supposed to be seen by Orren, PA on 01/13/2023 but left office before appointment occurred.  Patient recently presented to the emergency department on 08/20/2023 for dyspnea on exertion.  Patient had reported that he had quit smoking 3 weeks ago and developed increased cough with white phlegm and shortness of breath with exertion following.  He reported feeling short of breath with most activities such as with walking, dressing himself or cooking.  It was noted that he had missed dialysis on 7//25.  He denied chest pain.  He denied any orthopnea, PND, leg edema or syncope.  Echocardiogram on 08/21/2023 indicated LVEF 60 to 65%, no RWMA, mild concentric LVH, G2 DD, left atrial pressure was mildly elevated, RV systolic function and size was normal, severely elevated pulmonary artery systolic pressures,  LA was moderately dilated, mild mitral valve regurgitation.   Today he presents for follow-up.  He reports that he continues to have dyspnea on exertion, he notes frustration as he reports that he quit smoking 9 weeks ago and feels that following discontinuation he has had shortness of breath.  Patient also feels that his metoprolol  is  too strong, notes that his heart rate is regularly in the 40s at home and has noted increased fatigue.  He denies any dizziness, lightheadedness, presyncope or syncope. He also notes multiple frustrations regarding his health care, notes that he was post to follow-up with his PCP a few weeks ago however only received a text message reminder and not a phone call resulting in his missing of the appointment.  He also reports that when he was seen by his PCP he was not instructed on how to use his inhalers, reports that he had a paramedic this past weekend show him how to actually use his inhalers, notes that with use his breathing does improve. Patient notes he was also frustrated as he was to have a walker when he was discharged from the hospital however only recently received it in the past week.   ROS: .   Today he denies chest pain, palpitations, melena, hematuria, hemoptysis, diaphoresis, weakness, presyncope, syncope, orthopnea, and PND.  All other systems are reviewed and otherwise negative. Studies Reviewed: SABRA   EKG:  EKG is ordered today, personally reviewed, demonstrating  EKG Interpretation Date/Time:  Thursday October 01 2023 10:53:16 EDT Ventricular Rate:  49 PR Interval:  206 QRS Duration:  104 QT Interval:  492 QTC Calculation: 444 R Axis:   -53  Text Interpretation: Sinus bradycardia with frequent Premature atrial complexes Incomplete right bundle branch block Left anterior fascicular block Minimal voltage criteria for LVH, may be normal variant ( Cornell product ) T wave inversion in inferior leads T wave inversion anterolateral leads When compared with ECG of 20-Aug-2023 12:25, Premature atrial complexes are now Present Confirmed by James Wall [phone]) on 10/01/2023 1:08:22 PM   CV Studies: Cardiac studies reviewed are outlined and summarized above. Otherwise please see EMR for full report. Cardiac Studies & Procedures    ______________________________________________________________________________________________     ECHOCARDIOGRAM  ECHOCARDIOGRAM COMPLETE 08/21/2023  Narrative ECHOCARDIOGRAM REPORT    Patient Name:   James Wall [facility] Date of Exam: 08/21/2023 Medical Rec #:  [id number]           Height:       65.0 in Accession #:    [id number]          Weight:       196.9 lb Date of Birth:  [birth year]           BSA:          1.965 m Patient Age:    58 years            BP:           181/89 mmHg Patient Gender: M                   HR:           60 bpm. Exam Location:  Inpatient  Procedure: 2D Echo (Both Spectral and Color Flow Doppler were utilized during procedure).  Indications:    congestive heart failure. Dyspnea.  History:        Patient has prior history of Echocardiogram examinations, most recent 10/21/2022. CHF, end stage renal disease. Cirrhosis.; Risk Factors:Sleep Apnea and  Former Smoker.  Sonographer:    Tinnie Barefoot RDCS Referring Phys: [phone] BRITTANY J MCINTYRE  IMPRESSIONS   1. Left ventricular ejection fraction, by estimation, is 60 to 65%. The left ventricle has normal function. The left ventricle has no regional wall motion abnormalities. The left ventricular internal cavity size was mildly dilated. There is mild concentric left ventricular hypertrophy. Left ventricular diastolic parameters are consistent with Grade II diastolic dysfunction (pseudonormalization). Elevated left atrial pressure. 2. Right ventricular systolic function is normal. The right ventricular size is normal. There is severely elevated pulmonary artery systolic pressure. 3. Left atrial size was moderately dilated. 4. Mild mitral valve regurgitation. 5. The aortic valve is tricuspid. Aortic valve regurgitation is not visualized. Aortic valve sclerosis/calcification is present, without any evidence of aortic stenosis. 6. The inferior vena cava is dilated in size with >50% respiratory variability,  suggesting right atrial pressure of 8 mmHg.  FINDINGS Left Ventricle: Left ventricular ejection fraction, by estimation, is 60 to 65%. The left ventricle has normal function. The left ventricle has no regional wall motion abnormalities. The left ventricular internal cavity size was mildly dilated. There is mild concentric left ventricular hypertrophy. Left ventricular diastolic parameters are consistent with Grade II diastolic dysfunction (pseudonormalization). Elevated left atrial pressure.  Right Ventricle: The right ventricular size is normal. Right vetricular wall thickness was not assessed. Right ventricular systolic function is normal. There is severely elevated pulmonary artery systolic pressure. The tricuspid regurgitant velocity is 4.22 m/s, and with an assumed right atrial pressure of 15 mmHg, the estimated right ventricular systolic pressure is 86.2 mmHg.  Left Atrium: Left atrial size was moderately dilated.  Right Atrium: Right atrial size was normal in size.  Pericardium: There is no evidence of pericardial effusion.  Mitral Valve: There is mild thickening of the mitral valve leaflet(s). Mild mitral annular calcification. Mild mitral valve regurgitation.  Tricuspid Valve: The tricuspid valve is normal in structure. Tricuspid valve regurgitation is mild.  Aortic Valve: The aortic valve is tricuspid. Aortic valve regurgitation is not visualized. Aortic valve sclerosis/calcification is present, without any evidence of aortic stenosis. Aortic valve mean gradient measures 14.0 mmHg. Aortic valve peak gradient measures 28.9 mmHg. Aortic valve area, by VTI measures 1.27 cm.  Pulmonic Valve: The pulmonic valve was normal in structure. Pulmonic valve regurgitation is not visualized.  Aorta: The aortic root and ascending aorta are structurally normal, with no evidence of dilitation.  Venous: The inferior vena cava is dilated in size with greater than 50% respiratory variability,  suggesting right atrial pressure of 8 mmHg.  IAS/Shunts: No atrial level shunt detected by color flow Doppler.   LEFT VENTRICLE PLAX 2D LVIDd:         5.60 cm   Diastology LVIDs:         3.70 cm   LV e' medial:    8.24 cm/s LV PW:         1.40 cm   LV E/e' medial:  17.4 LV IVS:        1.30 cm   LV e' lateral:   9.32 cm/s LVOT diam:     2.00 cm   LV E/e' lateral: 15.3 LV SV:         63 LV SV Index:   32 LVOT Area:     3.14 cm   RIGHT VENTRICLE             IVC RV Basal diam:  2.80 cm     IVC diam: 2.40 cm RV  S prime:     13.40 cm/s TAPSE (M-mode): 1.9 cm  LEFT ATRIUM              Index        RIGHT ATRIUM           Index LA diam:        4.30 cm  2.19 cm/m   RA Area:     16.70 cm LA Vol (A2C):   108.0 ml 54.96 ml/m  RA Volume:   40.40 ml  20.56 ml/m LA Vol (A4C):   86.7 ml  44.12 ml/m LA Biplane Vol: 99.0 ml  50.38 ml/m AORTIC VALVE AV Area (Vmax):    1.18 cm AV Area (Vmean):   1.17 cm AV Area (VTI):     1.27 cm AV Vmax:           269.00 cm/s AV Vmean:          170.000 cm/s AV VTI:            0.498 m AV Peak Grad:      28.9 mmHg AV Mean Grad:      14.0 mmHg LVOT Vmax:         101.00 cm/s LVOT Vmean:        63.200 cm/s LVOT VTI:          0.201 m LVOT/AV VTI ratio: 0.40  AORTA Ao Root diam: 3.50 cm Ao Asc diam:  3.20 cm  MITRAL VALVE                TRICUSPID VALVE MV Area (PHT): 2.82 cm     TR Peak grad:   71.2 mmHg MV Decel Time: 269 msec     TR Vmax:        422.00 cm/s MR Peak grad: 89.1 mmHg MR Mean grad: 60.0 mmHg     SHUNTS MR Vmax:      472.00 cm/s   Systemic VTI:  0.20 m MR Vmean:     363.0 cm/s    Systemic Diam: 2.00 cm MV E velocity: 143.00 cm/s MV A velocity: 96.00 cm/s MV E/A ratio:  1.49  Vina Gull MD Electronically signed by Vina Gull MD Signature Date/Time: 08/21/2023/8:08:58 PM    Final    MONITORS  LONG TERM MONITOR (3-14 DAYS) 12/12/2022  Narrative Patch Wear Time:  13 days and 8 hours (2024-10-07T05:53:53-0400 to  2024-10-20T14:23:23-0400)  HR 49 - 210, average 75 bpm. 26 nonsustained SVT (longest 7 mins and 38s) and 2 nonsustained VT (longest 6 beats) No atrial fibrillation detected. Rare supraventricular ectopy. Frequent ventricular ectopy, 7.3%. 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 Symptom trigger episodes correspond to PVCs,   Cardiology referral placed based on protocol.   Fonda Kitty Cardiac Electrophysiology       ______________________________________________________________________________________________       Current Reported Medications:.    Current Meds  Medication Sig   acetaminophen  (TYLENOL ) 650 MG CR tablet Take 650 mg by mouth every 8 (eight) hours as needed for pain. Pt takes 2 tablet as needed.   albuterol  (VENTOLIN  HFA) 108 (90 Base) MCG/ACT inhaler Inhale 2 puffs into the lungs every 6 (six) hours as needed for wheezing or shortness of breath.   amLODipine  (NORVASC ) 10 MG tablet Take 1 tablet (10 mg total) by mouth at bedtime.   cloNIDine  (CATAPRES  - DOSED IN MG/24 HR) 0.3 mg/24hr patch Place 1 patch (0.3 mg total) onto the skin once a week on Sunday.   Doxercalciferol  (HECTOROL  IV) 7 mcg as  directed.   hydrALAZINE  (APRESOLINE ) 50 MG tablet Take 1 tablet (50 mg total) by mouth 2 (two) times daily.   isosorbide  dinitrate (ISORDIL ) 20 MG tablet Take 1 tablet (20 mg total) by mouth 2 (two) times daily.   latanoprost  (XALATAN ) 0.005 % ophthalmic solution Apply to eye.   metoprolol  tartrate (LOPRESSOR ) 50 MG tablet Take 1 tablet (50 mg total) by mouth 2 (two) times daily.   pantoprazole  (PROTONIX ) 20 MG tablet TAKE 1 TABLET(20 MG) BY MOUTH DAILY   SENSIPAR  90 MG tablet Take 90 mg by mouth daily.   Tiotropium Bromide-Olodaterol (STIOLTO RESPIMAT ) 2.5-2.5 MCG/ACT AERS Inhale 2 puffs into the lungs daily.   [DISCONTINUED] Metoprolol  Tartrate 75 MG TABS Take 1 tablet (75 mg total) by mouth 2 (two) times daily.    Physical Exam:    VS:  BP (!) 142/84   Pulse (!) 49   Ht 5'  5 (1.651 m)   Wt 197 lb 6.4 oz (89.5 kg)   SpO2 97%   BMI 32.85 kg/m    Wt Readings from Last 3 Encounters:  10/01/23 197 lb 6.4 oz (89.5 kg)  08/27/23 197 lb 9.6 oz (89.6 kg)  08/21/23 196 lb 13.9 oz (89.3 kg)    GEN: Well nourished, well developed in no acute distress NECK: No JVD; No carotid bruits CARDIAC: Slow, RR, no murmurs, rubs, gallops RESPIRATORY:  Clear to auscultation without rales, wheezing or rhonchi  ABDOMEN: Soft, non-tender, non-distended EXTREMITIES:  No edema; No acute deformity     Asessement and Plan:.    DOE/COPD/Pulm HTN: Patient presented to the emergency department in 08/2023 with complaints of 3 weeks of progressive DOE, no orthopnea, PND or peripheral edema.  High sensitive troponins in the 30s, flat and not consistent with ACS, EKG without acute changes. Cardiac catheterization in 2015 showed no evidence of CAD, patient has historically refused cardiac cath due to wish to use general anesthesia. Echocardiogram on 08/21/2023 indicated LVEF 60 to 65%, no RWMA, mild concentric LVH, G2 DD, left atrial pressure was mildly elevated, RV systolic function and size was normal, severely elevated pulmonary artery systolic pressures, LA was moderately dilated, mild mitral valve regurgitation. It was felt that DOE is likely due to volume overload, recommended HD to manage volume status.  Today patient reports that he continues to have dyspnea on exertion, also notes some increased bilateral lower extremity edema.  On further discussion with patient he continues to note that he would not be interested in cardiac catheterization unless with general anesthesia.  He also reports that he leaves dialysis before his session is truly finished, patient reports that he will not stay for a full session as he notes that patients that he sees that regularly stay for full sessions end up passing earlier.  Discussed with patient that he is likely volume up resulting in worsening dyspnea on  exertion in combination with his COPD and resulting pulmonary hypertension.  Patient verbalizes understanding however reports that he will continue to leave dialysis early, given this and not being interested in cardiac catheterization will not plan for RHC at this time.  Encouraged patient to also follow-up with his PCP has been managing his COPD given concerns regarding his inhalers.  Reviewed ED precautions.  Encouraged compliance with dialysis.  Bradycardia: Patient with noted history of sinus bradycardia, continued on beta-blockers given history of paroxysmal SVT, NSVT, frequent PVCs on prior monitors, managed on metoprolol  75 mg twice daily. Today he reports increased fatigue and low heart rates at  home, patient feels that he is being overmedicated on beta-blocker.  EKG today indicates sinus bradycardia at 49 bpm with frequent PACs and incomplete right bundle branch block.  Patient denies any palpitations, dizziness, lightheadedness however notes increased fatigue.  Will reduce his metoprolol  to tartrate to 50 mg twice daily.  Patient also with history of PVCs, he is agreeable to a 2-week cardiac monitor to evaluate bradycardia, PAC and PVC burden.  HFimpEF: LVEF down to 45 to 50% in 2019, refused cath without using general anesthesia, EF recovered since 2022.  Prior echo indicated severe pulmonary hypertension however refused cath at that time. Echocardiogram on 08/21/2023 indicated LVEF 60 to 65%, no RWMA, mild concentric LVH, G2 DD, left atrial pressure was mildly elevated, RV systolic function and size was normal, severely elevated pulmonary artery systolic pressures, LA was moderately dilated, mild mitral valve regurgitation.  Patient is not interested in cardiac catheterization as noted above unless he has general anesthesia. Reviewed ED precautions. Continue Isordil  20 mg twice daily and hydralazine  50 mg twice daily. Patient is not a candidate for ARNI/ACE inhibitor/ARB/SGLT2 inhibitor/MRA due to  ESRD  HTN: Blood pressure today 142/84.  Patient notes that his blood pressure fluctuates significantly. On clonidine  0.3 mg weekly, hydralazine  50 mg twice daily and Isordil  20 mg twice daily.  This has been monitored and managed per nephrology.  ESRD: Patient on dialysis M, W, F.  Encouraged patient to stay for full dialysis sessions.    Disposition: F/u with Dr. Santo in 2-3 months or sooner if needed.   Signed, Hailyn Zarr D Marithza Malachi, NP

## 2023-10-01 ENCOUNTER — Encounter: Payer: Self-pay | Admitting: Cardiology

## 2023-10-01 ENCOUNTER — Ambulatory Visit: Attending: Cardiology | Admitting: Cardiology

## 2023-10-01 ENCOUNTER — Ambulatory Visit

## 2023-10-01 VITALS — BP 142/84 | HR 49 | Ht 65.0 in | Wt 197.4 lb

## 2023-10-01 DIAGNOSIS — I471 Supraventricular tachycardia, unspecified: Secondary | ICD-10-CM

## 2023-10-01 DIAGNOSIS — J449 Chronic obstructive pulmonary disease, unspecified: Secondary | ICD-10-CM

## 2023-10-01 DIAGNOSIS — I10 Essential (primary) hypertension: Secondary | ICD-10-CM

## 2023-10-01 DIAGNOSIS — R002 Palpitations: Secondary | ICD-10-CM

## 2023-10-01 DIAGNOSIS — Z720 Tobacco use: Secondary | ICD-10-CM

## 2023-10-01 DIAGNOSIS — R0609 Other forms of dyspnea: Secondary | ICD-10-CM

## 2023-10-01 DIAGNOSIS — I5032 Chronic diastolic (congestive) heart failure: Secondary | ICD-10-CM

## 2023-10-01 MED ORDER — METOPROLOL TARTRATE 50 MG PO TABS: 50.0000 mg | ORAL_TABLET | Freq: Two times a day (BID) | ORAL | 3 refills | Status: DC

## 2023-10-01 NOTE — Patient Instructions (Signed)
 Medication Instructions:  Decrease Metoprolol  Tartrate 50 mg twice a day  *If you need a refill on your cardiac medications before your next appointment, please call your pharmacy*  Lab Work: No labs  Testing/Procedures: No testing  Follow-Up: At [facility], you and your health needs are our priority.  As part of our continuing mission to provide you with exceptional heart care, our providers are all part of one team.  This team includes your primary Cardiologist (physician) and Advanced Practice Providers or APPs (Physician Assistants and Nurse Practitioners) who all work together to provide you with the care you need, when you need it.  Your next appointment:   3 month(s)  Provider:   Stanly DELENA Leavens, MD    We recommend signing up for the patient portal called MyChart.  Sign up information is provided on this After Visit Summary.  MyChart is used to connect with patients for Virtual Visits (Telemedicine).  Patients are able to view lab/test results, encounter notes, upcoming appointments, etc.  Non-urgent messages can be sent to your provider as well.   To learn more about what you can do with MyChart, go to [email or web address].   Other Instructions ZIO XT- Long Term Monitor Instructions  Your physician has requested you wear a ZIO patch monitor for 14 days.  This is a single patch monitor. [employer] supplies one patch monitor per enrollment. Additional stickers are not available. Please do not apply patch if you will be having a Nuclear Stress Test,  Echocardiogram, Cardiac CT, MRI, or Chest Xray during the period you would be wearing the  monitor. The patch cannot be worn during these tests. You cannot remove and re-apply the  ZIO XT patch monitor.  Your ZIO patch monitor will be mailed 3 day USPS to your address on file. It may take 3-5 days  to receive your monitor after you have been enrolled.  Once you have received your monitor, please review the  enclosed instructions. Your monitor  has already been registered assigning a specific monitor serial # to you.  Billing and Patient Assistance Program Information  We have supplied [employer] with any of your insurance information on file for billing purposes. [employer] offers a sliding scale Patient Assistance Program for patients that do not have  insurance, or whose insurance does not completely cover the cost of the ZIO monitor.  You must apply for the Patient Assistance Program to qualify for this discounted rate.  To apply, please call [employer] at [phone], select option 4, select option 2, ask to apply for  Patient Assistance Program. Meredeth will ask your household income, and how many people  are in your household. They will quote your out-of-pocket cost based on that information.  [employer] will also be able to set up a 59-month, interest-free payment plan if needed.  Applying the monitor   Shave hair from upper left chest.  Hold abrader disc by orange tab. Rub abrader in 40 strokes over the upper left chest as  indicated in your monitor instructions.  Clean area with 4 enclosed alcohol pads. Let dry.  Apply patch as indicated in monitor instructions. Patch will be placed under collarbone on left  side of chest with arrow pointing upward.  Rub patch adhesive wings for 2 minutes. Remove white label marked 1. Remove the white  label marked 2. Rub patch adhesive wings for 2 additional minutes.  While looking in a mirror, press and release button in center of patch. A small green light will  flash 3-4 times. This will be your only indicator that the monitor has been turned on.  Do not shower for the first 24 hours. You may shower after the first 24 hours.  Press the button if you feel a symptom. You will hear a small click. Record Date, Time and  Symptom in the Patient Logbook.  When you are ready to remove the patch, follow instructions on the last 2 pages of Patient  Logbook.  Stick patch monitor onto the last page of Patient Logbook.  Place Patient Logbook in the blue and white box. Use locking tab on box and tape box closed  securely. The blue and white box has prepaid postage on it. Please place it in the mailbox as  soon as possible. Your physician should have your test results approximately 7 days after the  monitor has been mailed back to [facility].  Call [facility] Customer Care at [phone] if you have questions regarding  your ZIO XT patch monitor. Call them immediately if you see an orange light blinking on your  monitor.  If your monitor falls off in less than 4 days, contact our Monitor department at [phone].  If your monitor becomes loose or falls off after 4 days call [employer] at [phone] for  suggestions on securing your monitor

## 2023-10-01 NOTE — Progress Notes (Unsigned)
Enrolled for [employer] to mail a ZIO XT long term holter monitor to the patients address on file.   Dr. Chandrasekhar to read. 

## 2023-10-02 DIAGNOSIS — Z992 Dependence on renal dialysis: Secondary | ICD-10-CM | POA: Diagnosis not present

## 2023-10-02 DIAGNOSIS — T8249XA Other complication of vascular dialysis catheter, initial encounter: Secondary | ICD-10-CM | POA: Diagnosis not present

## 2023-10-02 DIAGNOSIS — D631 Anemia in chronic kidney disease: Secondary | ICD-10-CM | POA: Diagnosis not present

## 2023-10-02 DIAGNOSIS — N186 End stage renal disease: Secondary | ICD-10-CM | POA: Diagnosis not present

## 2023-10-02 DIAGNOSIS — N2581 Secondary hyperparathyroidism of renal origin: Secondary | ICD-10-CM | POA: Diagnosis not present

## 2023-10-02 DIAGNOSIS — D509 Iron deficiency anemia, unspecified: Secondary | ICD-10-CM | POA: Diagnosis not present

## 2023-10-02 DIAGNOSIS — D689 Coagulation defect, unspecified: Secondary | ICD-10-CM | POA: Diagnosis not present

## 2023-10-04 DIAGNOSIS — Z992 Dependence on renal dialysis: Secondary | ICD-10-CM | POA: Diagnosis not present

## 2023-10-04 DIAGNOSIS — I12 Hypertensive chronic kidney disease with 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: Secondary | ICD-10-CM | POA: Diagnosis not present

## 2023-10-04 DIAGNOSIS — N186 End stage renal disease: Secondary | ICD-10-CM | POA: Diagnosis not present

## 2023-10-05 DIAGNOSIS — Z992 Dependence on renal dialysis: Secondary | ICD-10-CM | POA: Diagnosis not present

## 2023-10-05 DIAGNOSIS — T8249XA Other complication of vascular dialysis catheter, initial encounter: Secondary | ICD-10-CM | POA: Diagnosis not present

## 2023-10-05 DIAGNOSIS — N186 End stage renal disease: Secondary | ICD-10-CM | POA: Diagnosis not present

## 2023-10-05 DIAGNOSIS — D631 Anemia in chronic kidney disease: Secondary | ICD-10-CM | POA: Diagnosis not present

## 2023-10-05 DIAGNOSIS — N2581 Secondary hyperparathyroidism of renal origin: Secondary | ICD-10-CM | POA: Diagnosis not present

## 2023-10-05 DIAGNOSIS — D509 Iron deficiency anemia, unspecified: Secondary | ICD-10-CM | POA: Diagnosis not present

## 2023-10-05 DIAGNOSIS — E1151 Type 2 diabetes mellitus with diabetic peripheral angiopathy without gangrene: Secondary | ICD-10-CM | POA: Diagnosis not present

## 2023-10-05 DIAGNOSIS — D689 Coagulation defect, unspecified: Secondary | ICD-10-CM | POA: Diagnosis not present

## 2023-10-07 DIAGNOSIS — D689 Coagulation defect, unspecified: Secondary | ICD-10-CM | POA: Diagnosis not present

## 2023-10-07 DIAGNOSIS — T8249XA Other complication of vascular dialysis catheter, initial encounter: Secondary | ICD-10-CM | POA: Diagnosis not present

## 2023-10-07 DIAGNOSIS — D509 Iron deficiency anemia, unspecified: Secondary | ICD-10-CM | POA: Diagnosis not present

## 2023-10-07 DIAGNOSIS — N2581 Secondary hyperparathyroidism of renal origin: Secondary | ICD-10-CM | POA: Diagnosis not present

## 2023-10-07 DIAGNOSIS — E1151 Type 2 diabetes mellitus with diabetic peripheral angiopathy without gangrene: Secondary | ICD-10-CM | POA: Diagnosis not present

## 2023-10-07 DIAGNOSIS — D631 Anemia in chronic kidney disease: Secondary | ICD-10-CM | POA: Diagnosis not present

## 2023-10-07 DIAGNOSIS — Z992 Dependence on renal dialysis: Secondary | ICD-10-CM | POA: Diagnosis not present

## 2023-10-07 DIAGNOSIS — N186 End stage renal disease: Secondary | ICD-10-CM | POA: Diagnosis not present

## 2023-10-09 DIAGNOSIS — N186 End stage renal disease: Secondary | ICD-10-CM | POA: Diagnosis not present

## 2023-10-09 DIAGNOSIS — D689 Coagulation defect, unspecified: Secondary | ICD-10-CM | POA: Diagnosis not present

## 2023-10-09 DIAGNOSIS — D509 Iron deficiency anemia, unspecified: Secondary | ICD-10-CM | POA: Diagnosis not present

## 2023-10-09 DIAGNOSIS — N2581 Secondary hyperparathyroidism of renal origin: Secondary | ICD-10-CM | POA: Diagnosis not present

## 2023-10-09 DIAGNOSIS — Z992 Dependence on renal dialysis: Secondary | ICD-10-CM | POA: Diagnosis not present

## 2023-10-09 DIAGNOSIS — T8249XA Other complication of vascular dialysis catheter, initial encounter: Secondary | ICD-10-CM | POA: Diagnosis not present

## 2023-10-09 DIAGNOSIS — E1151 Type 2 diabetes mellitus with diabetic peripheral angiopathy without gangrene: Secondary | ICD-10-CM | POA: Diagnosis not present

## 2023-10-09 DIAGNOSIS — D631 Anemia in chronic kidney disease: Secondary | ICD-10-CM | POA: Diagnosis not present

## 2023-10-12 DIAGNOSIS — T8249XA Other complication of vascular dialysis catheter, initial encounter: Secondary | ICD-10-CM | POA: Diagnosis not present

## 2023-10-12 DIAGNOSIS — Z992 Dependence on renal dialysis: Secondary | ICD-10-CM | POA: Diagnosis not present

## 2023-10-12 DIAGNOSIS — N2581 Secondary hyperparathyroidism of renal origin: Secondary | ICD-10-CM | POA: Diagnosis not present

## 2023-10-12 DIAGNOSIS — D509 Iron deficiency anemia, unspecified: Secondary | ICD-10-CM | POA: Diagnosis not present

## 2023-10-12 DIAGNOSIS — N186 End stage renal disease: Secondary | ICD-10-CM | POA: Diagnosis not present

## 2023-10-12 DIAGNOSIS — D631 Anemia in chronic kidney disease: Secondary | ICD-10-CM | POA: Diagnosis not present

## 2023-10-12 DIAGNOSIS — E1151 Type 2 diabetes mellitus with diabetic peripheral angiopathy without gangrene: Secondary | ICD-10-CM | POA: Diagnosis not present

## 2023-10-12 DIAGNOSIS — D689 Coagulation defect, unspecified: Secondary | ICD-10-CM | POA: Diagnosis not present

## 2023-10-14 DIAGNOSIS — Z992 Dependence on renal dialysis: Secondary | ICD-10-CM | POA: Diagnosis not present

## 2023-10-14 DIAGNOSIS — N186 End stage renal disease: Secondary | ICD-10-CM | POA: Diagnosis not present

## 2023-10-14 DIAGNOSIS — N2581 Secondary hyperparathyroidism of renal origin: Secondary | ICD-10-CM | POA: Diagnosis not present

## 2023-10-14 DIAGNOSIS — D631 Anemia in chronic kidney disease: Secondary | ICD-10-CM | POA: Diagnosis not present

## 2023-10-14 DIAGNOSIS — T8249XA Other complication of vascular dialysis catheter, initial encounter: Secondary | ICD-10-CM | POA: Diagnosis not present

## 2023-10-14 DIAGNOSIS — D509 Iron deficiency anemia, unspecified: Secondary | ICD-10-CM | POA: Diagnosis not present

## 2023-10-14 DIAGNOSIS — D689 Coagulation defect, unspecified: Secondary | ICD-10-CM | POA: Diagnosis not present

## 2023-10-14 DIAGNOSIS — E1151 Type 2 diabetes mellitus with diabetic peripheral angiopathy without gangrene: Secondary | ICD-10-CM | POA: Diagnosis not present

## 2023-10-16 DIAGNOSIS — T8249XA Other complication of vascular dialysis catheter, initial encounter: Secondary | ICD-10-CM | POA: Diagnosis not present

## 2023-10-16 DIAGNOSIS — E1151 Type 2 diabetes mellitus with diabetic peripheral angiopathy without gangrene: Secondary | ICD-10-CM | POA: Diagnosis not present

## 2023-10-16 DIAGNOSIS — D689 Coagulation defect, unspecified: Secondary | ICD-10-CM | POA: Diagnosis not present

## 2023-10-16 DIAGNOSIS — D631 Anemia in chronic kidney disease: Secondary | ICD-10-CM | POA: Diagnosis not present

## 2023-10-16 DIAGNOSIS — N2581 Secondary hyperparathyroidism of renal origin: Secondary | ICD-10-CM | POA: Diagnosis not present

## 2023-10-16 DIAGNOSIS — Z992 Dependence on renal dialysis: Secondary | ICD-10-CM | POA: Diagnosis not present

## 2023-10-16 DIAGNOSIS — D509 Iron deficiency anemia, unspecified: Secondary | ICD-10-CM | POA: Diagnosis not present

## 2023-10-16 DIAGNOSIS — N186 End stage renal disease: Secondary | ICD-10-CM | POA: Diagnosis not present

## 2023-10-19 DIAGNOSIS — D631 Anemia in chronic kidney disease: Secondary | ICD-10-CM | POA: Diagnosis not present

## 2023-10-19 DIAGNOSIS — T8249XA Other complication of vascular dialysis catheter, initial encounter: Secondary | ICD-10-CM | POA: Diagnosis not present

## 2023-10-19 DIAGNOSIS — N186 End stage renal disease: Secondary | ICD-10-CM | POA: Diagnosis not present

## 2023-10-19 DIAGNOSIS — Z992 Dependence on renal dialysis: Secondary | ICD-10-CM | POA: Diagnosis not present

## 2023-10-19 DIAGNOSIS — N2581 Secondary hyperparathyroidism of renal origin: Secondary | ICD-10-CM | POA: Diagnosis not present

## 2023-10-19 DIAGNOSIS — D689 Coagulation defect, unspecified: Secondary | ICD-10-CM | POA: Diagnosis not present

## 2023-10-19 DIAGNOSIS — D509 Iron deficiency anemia, unspecified: Secondary | ICD-10-CM | POA: Diagnosis not present

## 2023-10-19 DIAGNOSIS — E1151 Type 2 diabetes mellitus with diabetic peripheral angiopathy without gangrene: Secondary | ICD-10-CM | POA: Diagnosis not present

## 2023-10-21 DIAGNOSIS — E1151 Type 2 diabetes mellitus with diabetic peripheral angiopathy without gangrene: Secondary | ICD-10-CM | POA: Diagnosis not present

## 2023-10-21 DIAGNOSIS — T8249XA Other complication of vascular dialysis catheter, initial encounter: Secondary | ICD-10-CM | POA: Diagnosis not present

## 2023-10-21 DIAGNOSIS — N186 End stage renal disease: Secondary | ICD-10-CM | POA: Diagnosis not present

## 2023-10-21 DIAGNOSIS — N2581 Secondary hyperparathyroidism of renal origin: Secondary | ICD-10-CM | POA: Diagnosis not present

## 2023-10-21 DIAGNOSIS — D631 Anemia in chronic kidney disease: Secondary | ICD-10-CM | POA: Diagnosis not present

## 2023-10-21 DIAGNOSIS — D509 Iron deficiency anemia, unspecified: Secondary | ICD-10-CM | POA: Diagnosis not present

## 2023-10-21 DIAGNOSIS — Z992 Dependence on renal dialysis: Secondary | ICD-10-CM | POA: Diagnosis not present

## 2023-10-21 DIAGNOSIS — D689 Coagulation defect, unspecified: Secondary | ICD-10-CM | POA: Diagnosis not present

## 2023-10-23 DIAGNOSIS — Z992 Dependence on renal dialysis: Secondary | ICD-10-CM | POA: Diagnosis not present

## 2023-10-23 DIAGNOSIS — N186 End stage renal disease: Secondary | ICD-10-CM | POA: Diagnosis not present

## 2023-10-23 DIAGNOSIS — E1151 Type 2 diabetes mellitus with diabetic peripheral angiopathy without gangrene: Secondary | ICD-10-CM | POA: Diagnosis not present

## 2023-10-23 DIAGNOSIS — N2581 Secondary hyperparathyroidism of renal origin: Secondary | ICD-10-CM | POA: Diagnosis not present

## 2023-10-23 DIAGNOSIS — T8249XA Other complication of vascular dialysis catheter, initial encounter: Secondary | ICD-10-CM | POA: Diagnosis not present

## 2023-10-23 DIAGNOSIS — D631 Anemia in chronic kidney disease: Secondary | ICD-10-CM | POA: Diagnosis not present

## 2023-10-23 DIAGNOSIS — D509 Iron deficiency anemia, unspecified: Secondary | ICD-10-CM | POA: Diagnosis not present

## 2023-10-23 DIAGNOSIS — D689 Coagulation defect, unspecified: Secondary | ICD-10-CM | POA: Diagnosis not present

## 2023-10-26 DIAGNOSIS — Z992 Dependence on renal dialysis: Secondary | ICD-10-CM | POA: Diagnosis not present

## 2023-10-26 DIAGNOSIS — N2581 Secondary hyperparathyroidism of renal origin: Secondary | ICD-10-CM | POA: Diagnosis not present

## 2023-10-26 DIAGNOSIS — D509 Iron deficiency anemia, unspecified: Secondary | ICD-10-CM | POA: Diagnosis not present

## 2023-10-26 DIAGNOSIS — E1151 Type 2 diabetes mellitus with diabetic peripheral angiopathy without gangrene: Secondary | ICD-10-CM | POA: Diagnosis not present

## 2023-10-26 DIAGNOSIS — D689 Coagulation defect, unspecified: Secondary | ICD-10-CM | POA: Diagnosis not present

## 2023-10-26 DIAGNOSIS — D631 Anemia in chronic kidney disease: Secondary | ICD-10-CM | POA: Diagnosis not present

## 2023-10-26 DIAGNOSIS — N186 End stage renal disease: Secondary | ICD-10-CM | POA: Diagnosis not present

## 2023-10-26 DIAGNOSIS — T8249XA Other complication of vascular dialysis catheter, initial encounter: Secondary | ICD-10-CM | POA: Diagnosis not present

## 2023-10-28 DIAGNOSIS — N186 End stage renal disease: Secondary | ICD-10-CM | POA: Diagnosis not present

## 2023-10-28 DIAGNOSIS — T8249XA Other complication of vascular dialysis catheter, initial encounter: Secondary | ICD-10-CM | POA: Diagnosis not present

## 2023-10-28 DIAGNOSIS — D631 Anemia in chronic kidney disease: Secondary | ICD-10-CM | POA: Diagnosis not present

## 2023-10-28 DIAGNOSIS — Z992 Dependence on renal dialysis: Secondary | ICD-10-CM | POA: Diagnosis not present

## 2023-10-28 DIAGNOSIS — D509 Iron deficiency anemia, unspecified: Secondary | ICD-10-CM | POA: Diagnosis not present

## 2023-10-28 DIAGNOSIS — E1151 Type 2 diabetes mellitus with diabetic peripheral angiopathy without gangrene: Secondary | ICD-10-CM | POA: Diagnosis not present

## 2023-10-28 DIAGNOSIS — D689 Coagulation defect, unspecified: Secondary | ICD-10-CM | POA: Diagnosis not present

## 2023-10-28 DIAGNOSIS — N2581 Secondary hyperparathyroidism of renal origin: Secondary | ICD-10-CM | POA: Diagnosis not present

## 2023-10-30 DIAGNOSIS — D631 Anemia in chronic kidney disease: Secondary | ICD-10-CM | POA: Diagnosis not present

## 2023-10-30 DIAGNOSIS — D689 Coagulation defect, unspecified: Secondary | ICD-10-CM | POA: Diagnosis not present

## 2023-10-30 DIAGNOSIS — E1151 Type 2 diabetes mellitus with diabetic peripheral angiopathy without gangrene: Secondary | ICD-10-CM | POA: Diagnosis not present

## 2023-10-30 DIAGNOSIS — Z992 Dependence on renal dialysis: Secondary | ICD-10-CM | POA: Diagnosis not present

## 2023-10-30 DIAGNOSIS — D509 Iron deficiency anemia, unspecified: Secondary | ICD-10-CM | POA: Diagnosis not present

## 2023-10-30 DIAGNOSIS — N186 End stage renal disease: Secondary | ICD-10-CM | POA: Diagnosis not present

## 2023-10-30 DIAGNOSIS — N2581 Secondary hyperparathyroidism of renal origin: Secondary | ICD-10-CM | POA: Diagnosis not present

## 2023-10-30 DIAGNOSIS — T8249XA Other complication of vascular dialysis catheter, initial encounter: Secondary | ICD-10-CM | POA: Diagnosis not present

## 2023-11-02 DIAGNOSIS — E1151 Type 2 diabetes mellitus with diabetic peripheral angiopathy without gangrene: Secondary | ICD-10-CM | POA: Diagnosis not present

## 2023-11-02 DIAGNOSIS — D689 Coagulation defect, unspecified: Secondary | ICD-10-CM | POA: Diagnosis not present

## 2023-11-02 DIAGNOSIS — T8249XA Other complication of vascular dialysis catheter, initial encounter: Secondary | ICD-10-CM | POA: Diagnosis not present

## 2023-11-02 DIAGNOSIS — D631 Anemia in chronic kidney disease: Secondary | ICD-10-CM | POA: Diagnosis not present

## 2023-11-02 DIAGNOSIS — D509 Iron deficiency anemia, unspecified: Secondary | ICD-10-CM | POA: Diagnosis not present

## 2023-11-02 DIAGNOSIS — N186 End stage renal disease: Secondary | ICD-10-CM | POA: Diagnosis not present

## 2023-11-02 DIAGNOSIS — N2581 Secondary hyperparathyroidism of renal origin: Secondary | ICD-10-CM | POA: Diagnosis not present

## 2023-11-02 DIAGNOSIS — Z992 Dependence on renal dialysis: Secondary | ICD-10-CM | POA: Diagnosis not present

## 2023-11-03 DIAGNOSIS — I12 Hypertensive chronic kidney disease with 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: Secondary | ICD-10-CM | POA: Diagnosis not present

## 2023-11-24 ENCOUNTER — Ambulatory Visit (INDEPENDENT_AMBULATORY_CARE_PROVIDER_SITE_OTHER): Admitting: Family Medicine

## 2023-11-24 ENCOUNTER — Encounter: Payer: Self-pay | Admitting: Family Medicine

## 2023-11-24 VITALS — BP 150/100 | HR 78 | Ht 65.0 in

## 2023-11-24 DIAGNOSIS — Z23 Encounter for immunization: Secondary | ICD-10-CM | POA: Diagnosis not present

## 2023-11-24 DIAGNOSIS — I110 Hypertensive heart disease with heart failure: Secondary | ICD-10-CM

## 2023-11-24 DIAGNOSIS — R002 Palpitations: Secondary | ICD-10-CM

## 2023-11-24 DIAGNOSIS — K5900 Constipation, unspecified: Secondary | ICD-10-CM | POA: Diagnosis not present

## 2023-11-24 MED ORDER — SENNA 8.6 MG PO TABS: 1.0000 | ORAL_TABLET | Freq: Every day | ORAL | 0 refills | Status: DC

## 2023-11-24 MED ORDER — POLYETHYLENE GLYCOL 3350 17 GM/SCOOP PO POWD: 17.0000 g | Freq: Two times a day (BID) | ORAL | 1 refills | Status: DC | PRN

## 2023-11-24 NOTE — Assessment & Plan Note (Signed)
 Likely due to patient's SVT. Mostly improving. However, patient has not switched to lower metoprolol  50 mg dose.  - Monitor for symptoms after starting the reduced dose of metoprolol .  - follow up with cardiology in November.

## 2023-11-24 NOTE — Assessment & Plan Note (Signed)
 Most likely due to lapse in medication. Patient does not know which one he has been out of.  - patient will check his medications at home and call to ask for refills of the appropriate medications.  - Follow up in 2 weeks.

## 2023-11-24 NOTE — Progress Notes (Signed)
   SUBJECTIVE:   CHIEF COMPLAINT / HPI:  Discussed the use of AI scribe software for clinical note transcription with the patient, who gave verbal consent to proceed.  History of Present Illness James Wall is a 58 year old male with chronic kidney disease on dialysis who presents with abdominal bloating and heart racing post-dialysis.  Abdominal bloating and discomfort - Abdominal bloating and discomfort, particularly after starting renvela  has gotten worse over time.  - Constipated with BM once a week, has to strain.  - Abdomen feels hard when standing or walking - Laxatives, prune juice, and Miralax  have been tried with limited success - Increasing water  intake and reducing soda consumption - One episode of vomiting after drinking a lot of prune juice.  - No intense abdominal pain, able to tolerate po.   Palpitations and tachycardia - Heart racing episodes occur post-dialysis, especially when sitting or standing. However, is much better recently.  - No chest pain.  - History of supraventricular tachycardia - Currently on metoprolol , with recent dose reduction from 75 mg to 50 mg twice daily due to low heart rates after dialysis.  - Has not yet picked up the new metoprolol  dosage - Recently wore a heart monitor, which has been returned - Is following with cardiology and has appt with them 01/07/2024  Hypertension  - Patient says he recently ran out of some of his medications.  - He is unable to say which medications he ran out of but some of them were blood pressure medications.  - Says his BP is sometimes labile.  - HTN managed by nephrology.     PERTINENT  PMH / PSH: HFpEF, ESRD on dialysis, H/o CVA, HTN    OBJECTIVE:  BP (!) 150/100   Pulse 78   Ht 5' 5 (1.651 m)   SpO2 97%   BMI 32.85 kg/m    General: chronically ill appearing, in no acute distress CV: RRR, radial pulses equal and palpable, no BLE edema  Resp: Normal work of breathing on room air,  CTAB Abd: Soft, mildly distended, non tender to palpation.  Neuro: Alert & Oriented   ASSESSMENT/PLAN:   Assessment & Plan Constipation, unspecified constipation type Constipation could be related to renvela  or idiopathic. Abdominal discomfort most likely due to constipation. Abdominal exam without any red flags. Ineffective use of miralax  previously.  - discuss switching binder with nephrology  - Miralax  2 caps a day for one week then cap a day  - Senna for 3 days  - Follow up in 2 weeks  Palpitations Likely due to patient's SVT. Mostly improving. However, patient has not switched to lower metoprolol  50 mg dose.  - Monitor for symptoms after starting the reduced dose of metoprolol .  - follow up with cardiology in November.  Hypertensive heart disease with heart failure (HCC) Most likely due to lapse in medication. Patient does not know which one he has been out of.  - patient will check his medications at home and call to ask for refills of the appropriate medications.  - Follow up in 2 weeks.    Areta Saliva, MD [facility]

## 2023-11-24 NOTE — Patient Instructions (Addendum)
 It was wonderful to see you today.  Please bring ALL of your medications with you to every visit.   Today we talked about:  Constipation - Continue to discuss changing the binder with your nephrologist. For now please take miralax  1 cap twice a day for one week. Then transition to 1 cap every day. I have also prescribed senna for 3 days. Try to walk or move as much as you can as this can help as well.   Heart racing - Please pick up your metoprolol  50 mg. I am glad you are not feeling the racing at this time. If it gets worse please call your cardiologist to be seen sooner.    Please follow up in 2 weeks   Thank you for choosing [facility].   Please call [phone] with any questions about today's appointment.  Please be sure to schedule follow up at the front desk before you leave today.   Areta Saliva, MD  Family Medicine

## 2023-11-24 NOTE — Assessment & Plan Note (Signed)
 Constipation could be related to renvela  or idiopathic. Abdominal discomfort most likely due to constipation. Abdominal exam without any red flags. Ineffective use of miralax  previously.  - discuss switching binder with nephrology  - Miralax  2 caps a day for one week then cap a day  - Senna for 3 days  - Follow up in 2 weeks

## 2023-12-03 ENCOUNTER — Telehealth: Payer: Self-pay

## 2023-12-03 NOTE — Telephone Encounter (Signed)
 Patient James Wall on nurse line regarding continued issues with having bowel movements. Returned call to patient.   He reports that he has not had BM in the last week.   OV on 11/24/23 regarding this concern and was prescribed Miralax  and Senna. This has not helped his constipation. Today he drank 3/4 bottle of magnesium citrate. Approx 2 hours later, he was able to have large bowel movement. He denies blood in his stool. No current abdominal distention. He does report issues with gas, especially when changing from sitting to standing position.   States that he is not having abdominal pain, just slight discomfort. He continues to pass gas.   He reports that if he had not had BM today, he would have went to the ED due to discomfort.   He does not know what to do at this point, given continued constipation.   He is asking if we can schedule him for Tuesday morning, as this is the soonest he can come in with dialysis schedule.   Scheduled for Tuesday AM with Dr. Nicholas. Discussed ED precautions.   Patient voices understanding.   Patient is asking if he should make any adjustments to his medications in the meantime to aide with his constipation.   Please advise.   If the patient needs:   An appointment - route response to [facility] admin  A callback from clinic staff - route response to your team   Only route to RN Team: form completions or if RN team directly requests response sent to them  Chiquita JAYSON English, RN

## 2023-12-05 ENCOUNTER — Other Ambulatory Visit: Payer: Self-pay

## 2023-12-05 ENCOUNTER — Emergency Department (HOSPITAL_COMMUNITY)
Admission: EM | Admit: 2023-12-05 | Discharge: 2023-12-05 | Disposition: A | Discharge status: Home / Self Care | Attending: Emergency Medicine | Admitting: Emergency Medicine

## 2023-12-05 ENCOUNTER — Emergency Department (HOSPITAL_COMMUNITY)

## 2023-12-05 ENCOUNTER — Encounter (HOSPITAL_COMMUNITY): Payer: Self-pay

## 2023-12-05 DIAGNOSIS — Z992 Dependence on renal dialysis: Secondary | ICD-10-CM | POA: Diagnosis not present

## 2023-12-05 DIAGNOSIS — R1084 Generalized abdominal pain: Secondary | ICD-10-CM | POA: Insufficient documentation

## 2023-12-05 DIAGNOSIS — R112 Nausea with vomiting, unspecified: Secondary | ICD-10-CM | POA: Insufficient documentation

## 2023-12-05 DIAGNOSIS — R140 Abdominal distension (gaseous): Secondary | ICD-10-CM | POA: Insufficient documentation

## 2023-12-05 DIAGNOSIS — I129 Hypertensive chronic kidney disease with stage 1 through stage 4 chronic kidney disease, or unspecified chronic kidney disease: Secondary | ICD-10-CM | POA: Diagnosis not present

## 2023-12-05 DIAGNOSIS — N186 End stage renal disease: Secondary | ICD-10-CM | POA: Insufficient documentation

## 2023-12-05 DIAGNOSIS — N184 Chronic kidney disease, stage 4 (severe): Secondary | ICD-10-CM | POA: Insufficient documentation

## 2023-12-05 DIAGNOSIS — R93429 Abnormal radiologic findings on diagnostic imaging of unspecified kidney: Secondary | ICD-10-CM | POA: Diagnosis not present

## 2023-12-05 DIAGNOSIS — Z8546 Personal history of malignant neoplasm of prostate: Secondary | ICD-10-CM | POA: Diagnosis not present

## 2023-12-05 LAB — COMPREHENSIVE METABOLIC PANEL WITH GFR
ALT: 31 U/L (ref 0–44)
AST: 28 U/L (ref 15–41)
Albumin: 3.5 g/dL (ref 3.5–5.0)
Alkaline Phosphatase: 82 U/L (ref 38–126)
Anion gap: 17 — ABNORMAL HIGH (ref 5–15)
BUN: 23 mg/dL — ABNORMAL HIGH (ref 6–20)
CO2: 24 mmol/L (ref 22–32)
Calcium: 8.3 mg/dL — ABNORMAL LOW (ref 8.9–10.3)
Chloride: 96 mmol/L — ABNORMAL LOW (ref 98–111)
Creatinine, Ser: 8.68 mg/dL — ABNORMAL HIGH (ref 0.61–1.24)
GFR, Estimated: 6 mL/min — ABNORMAL LOW (ref >60)
Glucose, Bld: 92 mg/dL (ref 70–99)
Potassium: 3.8 mmol/L (ref 3.5–5.1)
Sodium: 137 mmol/L (ref 135–145)
Total Bilirubin: 2.2 mg/dL — ABNORMAL HIGH (ref 0.0–1.2)
Total Protein: 7.3 g/dL (ref 6.5–8.1)

## 2023-12-05 LAB — CBC
HCT: 35.5 % — ABNORMAL LOW (ref 39.0–52.0)
Hemoglobin: 11.3 g/dL — ABNORMAL LOW (ref 13.0–17.0)
MCH: 31.7 pg (ref 26.0–34.0)
MCHC: 31.8 g/dL (ref 30.0–36.0)
MCV: 99.7 fL (ref 80.0–100.0)
Platelets: 150 K/uL (ref 150–400)
RBC: 3.56 MIL/uL — ABNORMAL LOW (ref 4.22–5.81)
RDW: 18.3 % — ABNORMAL HIGH (ref 11.5–15.5)
WBC: 4.2 K/uL (ref 4.0–10.5)
nRBC: 0.7 % — ABNORMAL HIGH (ref 0.0–0.2)

## 2023-12-05 LAB — LIPASE, BLOOD: Lipase: 27 U/L (ref 11–51)

## 2023-12-05 MED ORDER — IOHEXOL 350 MG/ML SOLN
75.0000 mL | Freq: Once | INTRAVENOUS | Status: AC | PRN
  Administered 2023-12-05: 75 mL via INTRAVENOUS

## 2023-12-05 MED ORDER — ONDANSETRON 4 MG PO TBDP: 4.0000 mg | ORAL_TABLET | Freq: Three times a day (TID) | ORAL | 0 refills | Status: DC | PRN

## 2023-12-05 NOTE — ED Notes (Signed)
 Pt took BP medications today but BP is still high.

## 2023-12-05 NOTE — ED Notes (Signed)
 Dr ray aware of the pts bp  ready for triage

## 2023-12-05 NOTE — ED Notes (Signed)
 The pts temp not taken because he is eating a sandwich

## 2023-12-05 NOTE — ED Provider Notes (Signed)
  EMERGENCY DEPARTMENT AT [facility] Provider Note   CSN: [id number] Arrival date & time: 12/05/23  1158     Patient presents with: Abdominal Pain   James Wall is a 58 y.o. male.   HPI 58 year old male history of dialysis, Monday Wednesday Friday, prostate cancer, hypertension, stroke, presents today complaining of abdominal pain.  Reports he has had some pain off and on for a month.  Has worsened over the past several days.  He thought that he was constipated and took magnesium on Thursday.  He had large amount of stool ending in diarrhea on Thursday.  Since that time he has had decreased p.o. intake and has had some episodes of vomiting.  He describes the pain is more in the upper abdomen and feels very full when he stands up.    Prior to Admission medications   Medication Sig Start Date End Date Taking? Authorizing Provider  ondansetron  (ZOFRAN -ODT) 4 MG disintegrating tablet Take 1 tablet (4 mg total) by mouth every 8 (eight) hours as needed for nausea or vomiting. 12/05/23  Yes Levander Houston, MD  acetaminophen  (TYLENOL ) 650 MG CR tablet Take 650 mg by mouth every 8 (eight) hours as needed for pain. Pt takes 2 tablet as needed.    [provider]  albuterol  (VENTOLIN  HFA) 108 (90 Base) MCG/ACT inhaler Inhale 2 puffs into the lungs every 6 (six) hours as needed for wheezing or shortness of breath. 08/27/23   Howell Lunger, DO  amLODipine  (NORVASC ) 10 MG tablet Take 1 tablet (10 mg total) by mouth at bedtime. 12/05/21   Santo Stanly LABOR, MD  cloNIDine  (CATAPRES  - DOSED IN MG/24 HR) 0.3 mg/24hr patch Place 1 patch (0.3 mg total) onto the skin once a week on Sunday. 01/30/22   Chandrasekhar, Stanly LABOR, MD  Doxercalciferol  (HECTOROL  IV) 7 mcg as directed. 09/11/23 09/09/24  [provider]  hydrALAZINE  (APRESOLINE ) 50 MG tablet Take 1 tablet (50 mg total) by mouth 2 (two) times daily. 12/05/21   Chandrasekhar, Stanly LABOR, MD  hydrOXYzine  (ATARAX )  25 MG tablet Take 25 mg by mouth daily. Patient not taking: Reported on 10/01/2023    [provider]  isosorbide  dinitrate (ISORDIL ) 20 MG tablet Take 1 tablet (20 mg total) by mouth 2 (two) times daily. 12/05/21   Santo Stanly LABOR, MD  latanoprost  (XALATAN ) 0.005 % ophthalmic solution Apply to eye. 04/14/22   [provider]  metoprolol  tartrate (LOPRESSOR ) 50 MG tablet Take 1 tablet (50 mg total) by mouth 2 (two) times daily. 10/01/23 12/30/23  West, Katlyn D, NP  pantoprazole  (PROTONIX ) 20 MG tablet TAKE 1 TABLET(20 MG) BY MOUTH DAILY 01/09/22   [name], DO  polyethylene glycol powder (GLYCOLAX /MIRALAX ) 17 GM/SCOOP powder Take 17 g by mouth 2 (two) times daily as needed. Dissolve 1 capful (17g) in 4-8 ounces of liquid and take by mouth. For the first week take twice a day. Then take 1 capful daily after that 11/24/23   Boyina, Akhila, MD  rosuvastatin  (CRESTOR ) 10 MG tablet Take 1 tablet (10 mg total) by mouth daily. Patient not taking: Reported on 10/01/2023 12/05/21   Santo Stanly LABOR, MD  senna (SENOKOT) 8.6 MG TABS tablet Take 1 tablet (8.6 mg total) by mouth daily. 11/24/23   Nicholas Bar, MD  SENSIPAR  90 MG tablet Take 90 mg by mouth daily. 05/07/22   [provider]  sevelamer  carbonate (RENVELA ) 800 MG tablet Take 4 tablets (3,200 mg total) by mouth 3 (three) times  daily with meals. Patient not taking: Reported on 10/01/2023 09/30/21   Angiulli, Toribio PARAS, PA-C  tamsulosin  (FLOMAX ) 0.4 MG CAPS capsule TAKE 2 CAPSULES(0.8 MG) BY MOUTH DAILY Patient not taking: Reported on 10/01/2023 04/10/22   Dahbura, Anton, DO  Tiotropium Bromide-Olodaterol (STIOLTO RESPIMAT ) 2.5-2.5 MCG/ACT AERS Inhale 2 puffs into the lungs daily. 08/27/23   Howell Lunger, DO    Allergies: Venofer   [ferric oxide] and Aspirin     Review of Systems  Updated Vital Signs BP (!) 168/107   Pulse (!) 48   Temp 97.9 F (36.6 C) (Oral)   Resp 20   Ht 1.651 m (5' 5)   Wt 89.4 kg    SpO2 100%   BMI 32.78 kg/m   Physical Exam Vitals reviewed.  Constitutional:      General: He is not in acute distress.    Appearance: He is well-developed.  HENT:     Head: Normocephalic.     Mouth/Throat:     Mouth: Mucous membranes are moist.  Eyes:     Extraocular Movements: Extraocular movements intact.  Cardiovascular:     Rate and Rhythm: Normal rate and regular rhythm.     Heart sounds: Normal heart sounds.  Pulmonary:     Effort: Pulmonary effort is normal.     Breath sounds: Normal breath sounds.  Abdominal:     General: Bowel sounds are normal. There is distension.     Palpations: Abdomen is soft.     Tenderness: There is generalized abdominal tenderness.  Skin:    General: Skin is warm and dry.     Capillary Refill: Capillary refill takes less than 2 seconds.  Neurological:     General: No focal deficit present.     Mental Status: He is alert.  Psychiatric:        Mood and Affect: Mood normal.     (all labs ordered are listed, but only abnormal results are displayed) Labs Reviewed  CBC - Abnormal; Notable for the following components:      Result Value   RBC 3.56 (*)    Hemoglobin 11.3 (*)    HCT 35.5 (*)    RDW 18.3 (*)    nRBC 0.7 (*)    All other components within normal limits  COMPREHENSIVE METABOLIC PANEL WITH GFR - Abnormal; Notable for the following components:   Chloride 96 (*)    BUN 23 (*)    Creatinine, Ser 8.68 (*)    Calcium  8.3 (*)    Total Bilirubin 2.2 (*)    GFR, Estimated 6 (*)    Anion gap 17 (*)    All other components within normal limits  LIPASE, BLOOD    EKG: None  Radiology: CT ABDOMEN PELVIS W CONTRAST Result Date: 12/05/2023 CLINICAL DATA:  Abdominal pain EXAM: CT ABDOMEN AND PELVIS WITH CONTRAST TECHNIQUE: Multidetector CT imaging of the abdomen and pelvis was performed using the standard protocol following bolus administration of intravenous contrast. RADIATION DOSE REDUCTION: This exam was performed according  to the departmental dose-optimization program which includes automated exposure control, adjustment of the mA and/or kV according to patient size and/or use of iterative reconstruction technique. CONTRAST:  75mL OMNIPAQUE  IOHEXOL  350 MG/ML SOLN COMPARISON:  10/19/2022 FINDINGS: Lower chest: No acute pleural or parenchymal lung disease. Stable the lead within the left lower lobe. Hepatobiliary: Diffuse hepatic steatosis without focal parenchymal liver abnormality. Gallbladder is moderately distended without cholelithiasis or cholecystitis. No biliary duct dilation. Pancreas: Unremarkable. No pancreatic ductal dilatation or surrounding  inflammatory changes. Spleen: Normal in size without focal abnormality. Adrenals/Urinary Tract: Innumerable simple bilateral renal cortical cysts are again identified without significant interval change. Hyperdense lesion within the mid left kidney image 35/3 measures 2.3 x 2.0 cm, previously 2.0 x 1.6 cm. Hyperdense lesion within the posterior mid right kidney image 35/3 measures 1.5 x 1.3 cm, previously 1.6 x 1.5 cm. Complex cystic lesion within the anterior upper pole left kidney image 30/3 measures 2.0 x 1.8 cm, with peripheral calcifications and internal nodularity or thickened septa, unchanged since prior exam. There are punctate nonobstructing renal calculi. Bladder is decompressed, with chronic bladder wall thickening and perivesicular fat stranding again noted. The adrenals are stable. Stomach/Bowel: No bowel obstruction or ileus. Normal appendix right lower quadrant. No bowel wall thickening or inflammatory change. No significant fecal retention to suggest constipation. Vascular/Lymphatic: Aortic atherosclerosis. No enlarged abdominal or pelvic lymph nodes. Reproductive: Fiduciary markers are again noted within the prostate, which is otherwise unremarkable. Other: Trace free fluid within the upper abdomen and right lower quadrant, nonspecific. No free intraperitoneal gas. No  abdominal wall hernia. Musculoskeletal: There are no acute or destructive bony abnormalities. 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 Reconstructed images demonstrate no additional findings. IMPRESSION: 1. Trace free fluid within the upper abdomen and right lower quadrant, nonspecific. 2. Otherwise no acute intra-abdominal or intrapelvic process. 3. Stable hepatic steatosis. 4. Interval enlargement of the hyperdense mid left renal lesion on image 35/3 described above, now measuring up to 2.3 cm, compatible with renal cell carcinoma based on previous imaging characteristics. Remaining simple and complex cystic lesions within the bilateral kidneys are stable and do not require specific imaging follow-up. 5. Stable punctate bilateral nonobstructing renal calculi. 6. Chronic bladder wall thickening and perivesicular fat stranding, likely sequela of chronic bladder outlet obstruction. 7.  Aortic Atherosclerosis (ICD10-I70.0). Electronically Signed   By: Ozell Daring M.D.   On: 12/05/2023 14:37     Procedures   Medications Ordered in the ED  iohexol  (OMNIPAQUE ) 350 MG/ML injection 75 mL (75 mLs Intravenous Contrast Given 12/05/23 1419)    Clinical Course as of 12/05/23 1512  Sat Dec 05, 2023  1409 CBC reviewed interpreted similar for mild anemia [DR]  1410 Pleat metabolic panel reviewed interpreted center for hypocalcemia with known renal failure and elevated creatinine consistent with patient's known renal failure [DR]    Clinical Course User Index [DR] Levander Houston, MD                                 Medical Decision Making Amount and/or Complexity of Data Reviewed Labs: ordered. Radiology: ordered.  Risk Prescription drug management.   58 year old male end-stage renal disease on dialysis presents today complaining of constipation and some diffuse abdominal pain.  He states that his abdomen is more distended and hard when he stands up.  He has  had some episodes of vomiting but then was able to eat today.  He has had some ongoing nausea.  Has had no prior surgeries.  Patient was evaluated here in the ED with labs appear baseline for patient. Differential diagnosis for abdominal pain is broad and includes small bowel obstruction, perforated viscus, gallbladder infections, masses and lesions, bleeding, appendicitis, other infections, diseases of the great vessels.  CT of the abdomen and pelvis was obtained.  There is no acute abnormality noted.  However there are some hyperdense lesions noted in the kidney and  specifically the one in the mid left renal area has enlarged.  Patient appears stable here.  He is advised of the findings.  He appears stable for discharge. Plan to send antiemetics to his pharmacy. We have discussed the abnormality in the kidney and the need for follow-up urology.  Referral to be given     Final diagnoses:  Generalized abdominal pain  Abnormal CT scan, kidney    ED Discharge Orders          Ordered    ondansetron  (ZOFRAN -ODT) 4 MG disintegrating tablet  Every 8 hours PRN        12/05/23 1508               Levander Houston, MD 12/05/23 1512

## 2023-12-05 NOTE — Discharge Instructions (Signed)
 Please follow-up with urology for abnormality noted in kidneys on CT scan as we discussed Please use Zofran  and gently advance diet. Return if you are having new or worsening pain, fever, chills, your inability to tolerate liquids.

## 2023-12-05 NOTE — ED Triage Notes (Signed)
 Pt bib PTAR from home c/o abdominal pain. Pt has tried Miralax  and a Fleet at home with minimal relief. Pt hasn't had a good BM since Thursday. Pt said the fleet caused him to have watery stools and gas.   Hx Dialysis with binder   BP 178/100 HR 55 RA 99 T 97.7 CBG 132

## 2023-12-08 ENCOUNTER — Ambulatory Visit: Admitting: Family Medicine

## 2023-12-10 ENCOUNTER — Ambulatory Visit: Admitting: Family Medicine

## 2023-12-24 ENCOUNTER — Ambulatory Visit (INDEPENDENT_AMBULATORY_CARE_PROVIDER_SITE_OTHER): Admitting: Family Medicine

## 2023-12-24 ENCOUNTER — Encounter: Payer: Self-pay | Admitting: Family Medicine

## 2023-12-24 VITALS — BP 166/96 | HR 57 | Ht 65.0 in | Wt 208.2 lb

## 2023-12-24 DIAGNOSIS — I12 Hypertensive chronic kidney disease with 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: Secondary | ICD-10-CM | POA: Diagnosis not present

## 2023-12-24 DIAGNOSIS — N2889 Other specified disorders of kidney and ureter: Secondary | ICD-10-CM | POA: Diagnosis not present

## 2023-12-24 DIAGNOSIS — Z8673 Personal history of transient ischemic attack (TIA), and cerebral infarction without residual deficits: Secondary | ICD-10-CM | POA: Diagnosis not present

## 2023-12-24 MED ORDER — ROSUVASTATIN CALCIUM 10 MG PO TABS: 10.0000 mg | ORAL_TABLET | Freq: Every day | ORAL | 3 refills | Status: DC

## 2023-12-24 MED ORDER — HYDROXYZINE HCL 25 MG PO TABS: 25.0000 mg | ORAL_TABLET | Freq: Every day | ORAL | 0 refills | Status: DC

## 2023-12-24 MED ORDER — LATANOPROST 0.005 % OP SOLN: 1.0000 [drp] | Freq: Every day | OPHTHALMIC | 3 refills | Status: DC

## 2023-12-24 NOTE — Patient Instructions (Signed)
 It was great to see you today! Thank you for choosing [facility] for your primary care. James Wall was seen for ED f/u .  Today we addressed: ED f/u for abdominal pain - continue regimen that you are currently on to help with the constipation.  Follow-up with nephrology to discuss CT scan findings of growing mass on your kidney. Lipid panel today to monitor and most likely restart your Crestor  (cholesterol medication)  We are checking some labs today. I will send you a MyChart message with your results, per your preference. If you do not hear about your labs in the next 2 weeks, please call the office.   You should return to our clinic No follow-ups on file. Please arrive 15 minutes before your appointment to ensure smooth check in process.  We appreciate your efforts in making this happen.  Thank you for allowing me to participate in your care, Kathrine Melena, DO 12/24/2023, 2:28 PM PGY-2, [facility]

## 2023-12-24 NOTE — Progress Notes (Signed)
    SUBJECTIVE:   CHIEF COMPLAINT / HPI:   ED follow up Patient went to the ED for abdominal pain for possible constipation.  He has had bowel movements since.  Most of his pain occurs in the upper abdomen and feels very full when he stands up.  CT was done which showed no acute abnormality and some hyperdense lesions noted in the kidney, patible with RCC, especially in the mid left renal area.  Urology follow-up was recommended.  Abdominal Pain - 3 to 4 months - Using laxatives w/ constipation possibly related to Renvela  (he's been on this for 8 years) - Has tried Magnesium  Citrate, nephrology recommended max of 2x/week - Miralax  and Senna didn't help  PERTINENT  PMH / PSH: Hepatic steatosis, tubular adenoma, prostate cancer, ESRD on HD  OBJECTIVE:   BP (!) 166/96   Pulse (!) 57   Ht 5' 5 (1.651 m)   Wt 208 lb 3.2 oz (94.4 kg)   SpO2 99%   BMI 34.65 kg/m   General: Awake and Alert in NAD HEENT: NCAT. Sclera anicteric. No rhinorrhea. Cardiovascular: RRR. No M/R/G Respiratory: CTAB, normal WOB on RA. No wheezing, crackles, rhonchi, or diminished breath sounds. Abdomen: Soft, non-tender Extremities: Able to move all extremities, walks with a cane. No BLE edema, no deformities or significant joint findings. Skin: Warm and dry. No abrasions or rashes noted. Neuro: A&Ox3. No focal neurological deficits.  ASSESSMENT/PLAN:   Assessment & Plan Kidney mass Hyperdense lesions noted in bilateral kidneys.  Mid left kidney lesion enlarged on imaging on 12/05/2023 compared to 10/19/2022.  Multiple cystic lesions have also been noted in the kidney which are unchanged from prior. - Advised follow-up with nephrology for possible kidney biopsy vs discussion of next step in management for these lesions History of CVA (cerebrovascular accident) Lipid panel performed today, last done 2 years ago.  LDL WNL at that time.  Patient is unsure if he is currently taking his Crestor , did not bring his  medications today.  Advised restarting Crestor .  Hypertensive chronic kidney disease with 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) Advised medication compliance and to bring in medication with him to his next appointment.  No changes made to medications at this time.  No hypertensive symptoms at this time.   Kathrine Melena, DO [facility]

## 2023-12-24 NOTE — Assessment & Plan Note (Signed)
 Advised medication compliance and to bring in medication with him to his next appointment.  No changes made to medications at this time.  No hypertensive symptoms at this time.

## 2023-12-25 ENCOUNTER — Encounter (HOSPITAL_COMMUNITY): Payer: Self-pay

## 2023-12-25 ENCOUNTER — Inpatient Hospital Stay (HOSPITAL_COMMUNITY)
Admission: EM | Admit: 2023-12-25 | Discharge: 2024-01-02 | DRG: 981 | Disposition: A | Discharge status: Home Health | Attending: Family Medicine | Admitting: Family Medicine

## 2023-12-25 ENCOUNTER — Inpatient Hospital Stay (HOSPITAL_COMMUNITY)

## 2023-12-25 ENCOUNTER — Other Ambulatory Visit: Payer: Self-pay

## 2023-12-25 ENCOUNTER — Emergency Department (HOSPITAL_COMMUNITY)

## 2023-12-25 ENCOUNTER — Ambulatory Visit: Payer: Self-pay | Admitting: Family Medicine

## 2023-12-25 DIAGNOSIS — T80818A Extravasation of other vesicant agent, initial encounter: Secondary | ICD-10-CM | POA: Diagnosis present

## 2023-12-25 DIAGNOSIS — Z79899 Other long term (current) drug therapy: Secondary | ICD-10-CM | POA: Diagnosis not present

## 2023-12-25 DIAGNOSIS — N2889 Other specified disorders of kidney and ureter: Principal | ICD-10-CM | POA: Diagnosis present

## 2023-12-25 DIAGNOSIS — J810 Acute pulmonary edema: Secondary | ICD-10-CM | POA: Diagnosis present

## 2023-12-25 DIAGNOSIS — J449 Chronic obstructive pulmonary disease, unspecified: Secondary | ICD-10-CM | POA: Diagnosis present

## 2023-12-25 DIAGNOSIS — K683 Retroperitoneal hematoma: Secondary | ICD-10-CM | POA: Diagnosis present

## 2023-12-25 DIAGNOSIS — E876 Hypokalemia: Secondary | ICD-10-CM | POA: Diagnosis present

## 2023-12-25 DIAGNOSIS — I442 Atrioventricular block, complete: Secondary | ICD-10-CM | POA: Diagnosis not present

## 2023-12-25 DIAGNOSIS — Z8673 Personal history of transient ischemic attack (TIA), and cerebral infarction without residual deficits: Secondary | ICD-10-CM

## 2023-12-25 DIAGNOSIS — I2510 Atherosclerotic heart disease of native coronary artery without angina pectoris: Secondary | ICD-10-CM | POA: Diagnosis present

## 2023-12-25 DIAGNOSIS — D49512 Neoplasm of unspecified behavior of left kidney: Principal | ICD-10-CM | POA: Diagnosis present

## 2023-12-25 DIAGNOSIS — I5020 Unspecified systolic (congestive) heart failure: Secondary | ICD-10-CM | POA: Diagnosis not present

## 2023-12-25 DIAGNOSIS — E785 Hyperlipidemia, unspecified: Secondary | ICD-10-CM | POA: Diagnosis present

## 2023-12-25 DIAGNOSIS — I10 Essential (primary) hypertension: Secondary | ICD-10-CM | POA: Diagnosis not present

## 2023-12-25 DIAGNOSIS — I5032 Chronic diastolic (congestive) heart failure: Secondary | ICD-10-CM | POA: Diagnosis not present

## 2023-12-25 DIAGNOSIS — Z789 Other specified health status: Secondary | ICD-10-CM

## 2023-12-25 DIAGNOSIS — I4430 Unspecified atrioventricular block: Secondary | ICD-10-CM | POA: Diagnosis not present

## 2023-12-25 DIAGNOSIS — I495 Sick sinus syndrome: Secondary | ICD-10-CM | POA: Diagnosis present

## 2023-12-25 DIAGNOSIS — I472 Ventricular tachycardia, unspecified: Secondary | ICD-10-CM | POA: Diagnosis present

## 2023-12-25 DIAGNOSIS — N186 End stage renal disease: Secondary | ICD-10-CM | POA: Diagnosis present

## 2023-12-25 DIAGNOSIS — I493 Ventricular premature depolarization: Secondary | ICD-10-CM | POA: Diagnosis present

## 2023-12-25 DIAGNOSIS — Z8546 Personal history of malignant neoplasm of prostate: Secondary | ICD-10-CM

## 2023-12-25 DIAGNOSIS — Z825 Family history of asthma and other chronic lower respiratory diseases: Secondary | ICD-10-CM

## 2023-12-25 DIAGNOSIS — Z8614 Personal history of Methicillin resistant Staphylococcus aureus infection: Secondary | ICD-10-CM

## 2023-12-25 DIAGNOSIS — Z823 Family history of stroke: Secondary | ICD-10-CM

## 2023-12-25 DIAGNOSIS — E1122 Type 2 diabetes mellitus with diabetic chronic kidney disease: Secondary | ICD-10-CM | POA: Diagnosis present

## 2023-12-25 DIAGNOSIS — R109 Unspecified abdominal pain: Secondary | ICD-10-CM | POA: Insufficient documentation

## 2023-12-25 DIAGNOSIS — G4733 Obstructive sleep apnea (adult) (pediatric): Secondary | ICD-10-CM | POA: Diagnosis present

## 2023-12-25 DIAGNOSIS — D62 Acute posthemorrhagic anemia: Secondary | ICD-10-CM | POA: Diagnosis present

## 2023-12-25 DIAGNOSIS — K219 Gastro-esophageal reflux disease without esophagitis: Secondary | ICD-10-CM | POA: Diagnosis present

## 2023-12-25 DIAGNOSIS — R001 Bradycardia, unspecified: Secondary | ICD-10-CM | POA: Diagnosis not present

## 2023-12-25 DIAGNOSIS — I132 Hypertensive heart and chronic kidney disease with heart failure and with stage 5 chronic kidney disease, or end stage renal disease: Secondary | ICD-10-CM | POA: Diagnosis present

## 2023-12-25 DIAGNOSIS — I4719 Other supraventricular tachycardia: Secondary | ICD-10-CM | POA: Diagnosis present

## 2023-12-25 DIAGNOSIS — D631 Anemia in chronic kidney disease: Secondary | ICD-10-CM | POA: Diagnosis present

## 2023-12-25 DIAGNOSIS — I440 Atrioventricular block, first degree: Secondary | ICD-10-CM | POA: Diagnosis present

## 2023-12-25 DIAGNOSIS — I455 Other specified heart block: Secondary | ICD-10-CM | POA: Diagnosis not present

## 2023-12-25 DIAGNOSIS — Z860101 Personal history of adenomatous and serrated colon polyps: Secondary | ICD-10-CM

## 2023-12-25 DIAGNOSIS — F17210 Nicotine dependence, cigarettes, uncomplicated: Secondary | ICD-10-CM | POA: Diagnosis present

## 2023-12-25 DIAGNOSIS — I272 Pulmonary hypertension, unspecified: Secondary | ICD-10-CM | POA: Diagnosis present

## 2023-12-25 DIAGNOSIS — R1032 Left lower quadrant pain: Secondary | ICD-10-CM | POA: Diagnosis present

## 2023-12-25 DIAGNOSIS — I5042 Chronic combined systolic (congestive) and diastolic (congestive) heart failure: Secondary | ICD-10-CM | POA: Diagnosis present

## 2023-12-25 DIAGNOSIS — Z8249 Family history of ischemic heart disease and other diseases of the circulatory system: Secondary | ICD-10-CM

## 2023-12-25 DIAGNOSIS — N2581 Secondary hyperparathyroidism of renal origin: Secondary | ICD-10-CM | POA: Diagnosis present

## 2023-12-25 DIAGNOSIS — Z888 Allergy status to other drugs, medicaments and biological substances status: Secondary | ICD-10-CM

## 2023-12-25 DIAGNOSIS — R944 Abnormal results of kidney function studies: Secondary | ICD-10-CM | POA: Diagnosis present

## 2023-12-25 DIAGNOSIS — S37019A Minor contusion of unspecified kidney, initial encounter: Secondary | ICD-10-CM | POA: Diagnosis not present

## 2023-12-25 DIAGNOSIS — R140 Abdominal distension (gaseous): Secondary | ICD-10-CM | POA: Diagnosis present

## 2023-12-25 DIAGNOSIS — K5900 Constipation, unspecified: Secondary | ICD-10-CM | POA: Diagnosis present

## 2023-12-25 DIAGNOSIS — S37012A Minor contusion of left kidney, initial encounter: Secondary | ICD-10-CM | POA: Diagnosis present

## 2023-12-25 DIAGNOSIS — Z992 Dependence on renal dialysis: Secondary | ICD-10-CM

## 2023-12-25 DIAGNOSIS — Z886 Allergy status to analgesic agent status: Secondary | ICD-10-CM

## 2023-12-25 DIAGNOSIS — R000 Tachycardia, unspecified: Secondary | ICD-10-CM | POA: Insufficient documentation

## 2023-12-25 HISTORY — PX: IR EMBO ART  VEN HEMORR LYMPH EXTRAV  INC GUIDE ROADMAPPING: IMG5450

## 2023-12-25 HISTORY — PX: IR RENAL SELECTIVE  UNI INC S&I MOD SED: IMG654

## 2023-12-25 HISTORY — PX: IR US GUIDE VASC ACCESS RIGHT: IMG2390

## 2023-12-25 HISTORY — PX: IR ANGIOGRAM SELECTIVE EACH ADDITIONAL VESSEL: IMG667

## 2023-12-25 LAB — COMPREHENSIVE METABOLIC PANEL WITH GFR
ALT: 14 U/L (ref 0–44)
AST: 21 U/L (ref 15–41)
Albumin: 3.2 g/dL — ABNORMAL LOW (ref 3.5–5.0)
Alkaline Phosphatase: 78 U/L (ref 38–126)
Anion gap: 18 — ABNORMAL HIGH (ref 5–15)
BUN: 37 mg/dL — ABNORMAL HIGH (ref 6–20)
CO2: 26 mmol/L (ref 22–32)
Calcium: 8.6 mg/dL — ABNORMAL LOW (ref 8.9–10.3)
Chloride: 98 mmol/L (ref 98–111)
Creatinine, Ser: 10.23 mg/dL — ABNORMAL HIGH (ref 0.61–1.24)
GFR, Estimated: 5 mL/min — ABNORMAL LOW (ref >60)
Glucose, Bld: 148 mg/dL — ABNORMAL HIGH (ref 70–99)
Potassium: 3.5 mmol/L (ref 3.5–5.1)
Sodium: 142 mmol/L (ref 135–145)
Total Bilirubin: 1.3 mg/dL — ABNORMAL HIGH (ref 0.0–1.2)
Total Protein: 6.7 g/dL (ref 6.5–8.1)

## 2023-12-25 LAB — CBC
HCT: 30.2 % — ABNORMAL LOW (ref 39.0–52.0)
HCT: 23.1 % — ABNORMAL LOW (ref 39.0–52.0)
HCT: 23.4 % — ABNORMAL LOW (ref 39.0–52.0)
Hemoglobin: 9.6 g/dL — ABNORMAL LOW (ref 13.0–17.0)
Hemoglobin: 7.4 g/dL — ABNORMAL LOW (ref 13.0–17.0)
Hemoglobin: 7.5 g/dL — ABNORMAL LOW (ref 13.0–17.0)
MCH: 32.2 pg (ref 26.0–34.0)
MCH: 32.6 pg (ref 26.0–34.0)
MCH: 32.6 pg (ref 26.0–34.0)
MCHC: 31.8 g/dL (ref 30.0–36.0)
MCHC: 32 g/dL (ref 30.0–36.0)
MCHC: 32.1 g/dL (ref 30.0–36.0)
MCV: 101.3 fL — ABNORMAL HIGH (ref 80.0–100.0)
MCV: 101.8 fL — ABNORMAL HIGH (ref 80.0–100.0)
MCV: 101.7 fL — ABNORMAL HIGH (ref 80.0–100.0)
Platelets: 176 K/uL (ref 150–400)
Platelets: 149 K/uL — ABNORMAL LOW (ref 150–400)
Platelets: 133 K/uL — ABNORMAL LOW (ref 150–400)
RBC: 2.98 MIL/uL — ABNORMAL LOW (ref 4.22–5.81)
RBC: 2.27 MIL/uL — ABNORMAL LOW (ref 4.22–5.81)
RBC: 2.3 MIL/uL — ABNORMAL LOW (ref 4.22–5.81)
RDW: 17.3 % — ABNORMAL HIGH (ref 11.5–15.5)
RDW: 17.3 % — ABNORMAL HIGH (ref 11.5–15.5)
RDW: 17.3 % — ABNORMAL HIGH (ref 11.5–15.5)
WBC: 6.7 K/uL (ref 4.0–10.5)
WBC: 6.5 K/uL (ref 4.0–10.5)
WBC: 6.6 K/uL (ref 4.0–10.5)
nRBC: 0 % (ref 0.0–0.2)
nRBC: 0 % (ref 0.0–0.2)
nRBC: 0 % (ref 0.0–0.2)

## 2023-12-25 LAB — I-STAT CHEM 8, ED
BUN: 33 mg/dL — ABNORMAL HIGH (ref 6–20)
Calcium, Ion: 0.99 mmol/L — ABNORMAL LOW (ref 1.15–1.40)
Chloride: 99 mmol/L (ref 98–111)
Creatinine, Ser: 10.3 mg/dL — ABNORMAL HIGH (ref 0.61–1.24)
Glucose, Bld: 140 mg/dL — ABNORMAL HIGH (ref 70–99)
HCT: 31 % — ABNORMAL LOW (ref 39.0–52.0)
Hemoglobin: 10.5 g/dL — ABNORMAL LOW (ref 13.0–17.0)
Potassium: 3.4 mmol/L — ABNORMAL LOW (ref 3.5–5.1)
Sodium: 139 mmol/L (ref 135–145)
TCO2: 28 mmol/L (ref 22–32)

## 2023-12-25 LAB — PROTIME-INR
INR: 1.3 — ABNORMAL HIGH (ref 0.8–1.2)
Prothrombin Time: 16.6 s — ABNORMAL HIGH (ref 11.4–15.2)

## 2023-12-25 LAB — HEPATITIS B SURFACE ANTIGEN: Hepatitis B Surface Ag: NONREACTIVE

## 2023-12-25 LAB — LIPID PANEL
Chol/HDL Ratio: 2.1 ratio (ref 0.0–5.0)
Cholesterol, Total: 142 mg/dL (ref 100–199)
HDL: 67 mg/dL (ref >39)
LDL Chol Calc (NIH): 61 mg/dL (ref 0–99)
Triglycerides: 70 mg/dL (ref 0–149)
VLDL Cholesterol Cal: 14 mg/dL (ref 5–40)

## 2023-12-25 LAB — MRSA NEXT GEN BY PCR, NASAL: MRSA by PCR Next Gen: NOT DETECTED

## 2023-12-25 LAB — LIPASE, BLOOD: Lipase: 33 U/L (ref 11–51)

## 2023-12-25 MED ORDER — ORAL CARE MOUTH RINSE: 15.0000 mL | OROMUCOSAL | Status: DC | PRN

## 2023-12-25 MED ORDER — UMECLIDINIUM BROMIDE 62.5 MCG/ACT IN AEPB
1.0000 | INHALATION_SPRAY | Freq: Every day | RESPIRATORY_TRACT | Status: DC
  Administered 2023-12-31: 1 via RESPIRATORY_TRACT
  Filled 2023-12-25 (×2): qty 7

## 2023-12-25 MED ORDER — DOCUSATE SODIUM 100 MG PO CAPS: 100.0000 mg | ORAL_CAPSULE | Freq: Two times a day (BID) | ORAL | Status: DC | PRN

## 2023-12-25 MED ORDER — CINACALCET HCL 30 MG PO TABS
90.0000 mg | ORAL_TABLET | Freq: Every day | ORAL | Status: DC
  Administered 2023-12-26 – 2023-12-27 (×2): 90 mg via ORAL
  Filled 2023-12-25 (×4): qty 3

## 2023-12-25 MED ORDER — ISOSORBIDE DINITRATE 20 MG PO TABS
20.0000 mg | ORAL_TABLET | Freq: Two times a day (BID) | ORAL | Status: DC
  Administered 2023-12-25 – 2024-01-02 (×16): 20 mg via ORAL
  Filled 2023-12-25 (×3): qty 1
  Filled 2023-12-25: qty 2
  Filled 2023-12-25 (×11): qty 1
  Filled 2023-12-25: qty 2
  Filled 2023-12-25 (×3): qty 1

## 2023-12-25 MED ORDER — ARFORMOTEROL TARTRATE 15 MCG/2ML IN NEBU
15.0000 ug | INHALATION_SOLUTION | Freq: Two times a day (BID) | RESPIRATORY_TRACT | Status: DC
  Administered 2023-12-25 – 2024-01-02 (×13): 15 ug via RESPIRATORY_TRACT
  Filled 2023-12-25 (×15): qty 2

## 2023-12-25 MED ORDER — PANTOPRAZOLE SODIUM 40 MG PO TBEC
40.0000 mg | DELAYED_RELEASE_TABLET | Freq: Every day | ORAL | Status: DC
  Administered 2023-12-25 – 2024-01-02 (×9): 40 mg via ORAL
  Filled 2023-12-25 (×9): qty 1

## 2023-12-25 MED ORDER — FENTANYL CITRATE (PF) 50 MCG/ML IJ SOSY
100.0000 ug | PREFILLED_SYRINGE | Freq: Once | INTRAMUSCULAR | Status: AC
  Administered 2023-12-25: 100 ug via INTRAVENOUS
  Filled 2023-12-25: qty 2

## 2023-12-25 MED ORDER — ONDANSETRON HCL 4 MG/2ML IJ SOLN
4.0000 mg | Freq: Once | INTRAMUSCULAR | Status: AC
  Administered 2023-12-25: 4 mg via INTRAVENOUS
  Filled 2023-12-25: qty 2

## 2023-12-25 MED ORDER — AMLODIPINE BESYLATE 10 MG PO TABS
10.0000 mg | ORAL_TABLET | Freq: Every day | ORAL | Status: DC
  Administered 2023-12-26 – 2024-01-01 (×7): 10 mg via ORAL
  Filled 2023-12-25: qty 2
  Filled 2023-12-25 (×7): qty 1

## 2023-12-25 MED ORDER — LIDOCAINE HCL 1 % IJ SOLN
INTRAMUSCULAR | Status: AC
  Filled 2023-12-25: qty 20

## 2023-12-25 MED ORDER — HYDROMORPHONE HCL 1 MG/ML IJ SOLN: 0.5000 mg | INTRAMUSCULAR | Status: DC | PRN

## 2023-12-25 MED ORDER — IOHEXOL 300 MG/ML  SOLN
150.0000 mL | Freq: Once | INTRAMUSCULAR | Status: AC | PRN
  Administered 2023-12-25: 60 mL via INTRA_ARTERIAL

## 2023-12-25 MED ORDER — FENTANYL CITRATE (PF) 50 MCG/ML IJ SOSY
50.0000 ug | PREFILLED_SYRINGE | Freq: Once | INTRAMUSCULAR | Status: AC
  Administered 2023-12-25: 50 ug via INTRAVENOUS
  Filled 2023-12-25: qty 1

## 2023-12-25 MED ORDER — MIDAZOLAM HCL 2 MG/2ML IJ SOLN
INTRAMUSCULAR | Status: AC
Start: 2023-12-25 — End: 2023-12-25
  Filled 2023-12-25: qty 2

## 2023-12-25 MED ORDER — POLYETHYLENE GLYCOL 3350 17 G PO PACK: 17.0000 g | PACK | Freq: Every day | ORAL | Status: DC | PRN

## 2023-12-25 MED ORDER — HYDRALAZINE HCL 50 MG PO TABS
50.0000 mg | ORAL_TABLET | Freq: Two times a day (BID) | ORAL | Status: DC
  Administered 2023-12-25 – 2023-12-26 (×2): 50 mg via ORAL
  Filled 2023-12-25: qty 2
  Filled 2023-12-25: qty 1

## 2023-12-25 MED ORDER — MIDAZOLAM HCL (PF) 2 MG/2ML IJ SOLN
INTRAMUSCULAR | Status: AC | PRN
  Administered 2023-12-25 (×3): .5 mg via INTRAVENOUS
  Administered 2023-12-25: 1 mg via INTRAVENOUS

## 2023-12-25 MED ORDER — MIDAZOLAM HCL 2 MG/2ML IJ SOLN
INTRAMUSCULAR | Status: AC
  Filled 2023-12-25: qty 2

## 2023-12-25 MED ORDER — ACETAMINOPHEN 325 MG PO TABS
650.0000 mg | ORAL_TABLET | Freq: Four times a day (QID) | ORAL | Status: DC | PRN
  Administered 2023-12-25: 650 mg via ORAL
  Filled 2023-12-25: qty 2

## 2023-12-25 MED ORDER — ROSUVASTATIN CALCIUM 5 MG PO TABS
10.0000 mg | ORAL_TABLET | Freq: Every day | ORAL | Status: DC
  Administered 2023-12-25 – 2023-12-28 (×4): 10 mg via ORAL
  Filled 2023-12-25 (×3): qty 2

## 2023-12-25 MED ORDER — HEPARIN SODIUM (PORCINE) 1000 UNIT/ML IJ SOLN
INTRAMUSCULAR | Status: AC
  Filled 2023-12-25: qty 4

## 2023-12-25 MED ORDER — FENTANYL CITRATE (PF) 100 MCG/2ML IJ SOLN
INTRAMUSCULAR | Status: AC
  Filled 2023-12-25: qty 2

## 2023-12-25 MED ORDER — IOHEXOL 350 MG/ML SOLN
100.0000 mL | Freq: Once | INTRAVENOUS | Status: AC | PRN
Start: 2023-12-25 — End: 2023-12-25
  Administered 2023-12-25: 100 mL via INTRAVENOUS

## 2023-12-25 MED ORDER — DIPHENHYDRAMINE HCL 50 MG/ML IJ SOLN
INTRAMUSCULAR | Status: AC
  Filled 2023-12-25: qty 1

## 2023-12-25 MED ORDER — LIDOCAINE HCL 1 % IJ SOLN
20.0000 mL | Freq: Once | INTRAMUSCULAR | Status: AC
  Administered 2023-12-25: 7 mL via INTRADERMAL

## 2023-12-25 MED ORDER — ALBUTEROL SULFATE (2.5 MG/3ML) 0.083% IN NEBU: 2.5000 mg | INHALATION_SOLUTION | Freq: Four times a day (QID) | RESPIRATORY_TRACT | Status: DC | PRN

## 2023-12-25 MED ORDER — CHLORHEXIDINE GLUCONATE CLOTH 2 % EX PADS: 6.0000 | MEDICATED_PAD | Freq: Every day | CUTANEOUS | Status: DC

## 2023-12-25 MED ORDER — FENTANYL CITRATE (PF) 100 MCG/2ML IJ SOLN
INTRAMUSCULAR | Status: AC | PRN
  Administered 2023-12-25 (×2): 25 ug via INTRAVENOUS
  Administered 2023-12-25: 50 ug via INTRAVENOUS
  Administered 2023-12-25: 25 ug via INTRAVENOUS

## 2023-12-25 MED ORDER — METOPROLOL TARTRATE 50 MG PO TABS
50.0000 mg | ORAL_TABLET | Freq: Two times a day (BID) | ORAL | Status: DC
  Administered 2023-12-25 – 2023-12-27 (×2): 50 mg via ORAL
  Filled 2023-12-25 (×2): qty 1
  Filled 2023-12-25: qty 2
  Filled 2023-12-25: qty 1

## 2023-12-25 MED ORDER — CHLORHEXIDINE GLUCONATE CLOTH 2 % EX PADS
6.0000 | MEDICATED_PAD | Freq: Every day | CUTANEOUS | Status: DC
  Administered 2023-12-25 – 2023-12-30 (×4): 6 via TOPICAL

## 2023-12-25 MED ORDER — HYDROMORPHONE HCL 1 MG/ML IJ SOLN
1.0000 mg | Freq: Once | INTRAMUSCULAR | Status: AC | PRN
  Administered 2023-12-25: 1 mg via INTRAVENOUS
  Filled 2023-12-25: qty 1

## 2023-12-25 MED ORDER — HYDROMORPHONE HCL 1 MG/ML IJ SOLN
1.0000 mg | INTRAMUSCULAR | Status: DC | PRN
  Administered 2023-12-25 – 2023-12-26 (×2): 1 mg via INTRAVENOUS
  Filled 2023-12-25 (×2): qty 1

## 2023-12-25 NOTE — Plan of Care (Signed)

## 2023-12-25 NOTE — ED Provider Triage Note (Signed)
 Emergency Medicine Provider Triage Evaluation Note  James Wall , a 58 y.o. male  was evaluated in triage.  Pt complains of abdominal pain.  Review of Systems  Positive: Abdominal pain Negative: Chest pain  Physical Exam  BP (!) 218/114 (BP Location: Right Arm)   Pulse (!) 58   Temp 97.7 F (36.5 C)   Resp (!) 22   Ht 1.651 m (5' 5)   Wt 94.3 kg   SpO2 91%   BMI 34.61 kg/m  Gen:   Awake, diaphoretic Resp:  Normal effort  MSK:   Moves extremities without difficulty  Other:  Abdominal tenderness  Medical Decision Making  Medically screening exam initiated at 1:34 AM.  Appropriate orders placed.  James Wall was informed that the remainder of the evaluation will be completed by another provider, this initial triage assessment does not replace that evaluation, and the importance of remaining in the ED until their evaluation is complete.  Pt will need ED monitoring to check labs since he is on HD   James Golas, MD 12/25/23 [phone]

## 2023-12-25 NOTE — Consult Note (Signed)
 Renal Service Consult Note [facility] James JONETTA Fret, MD  Patient: James Wall Date: 12/25/2023 Requesting Physician: Dr. Harold   Reason for Consult: ESRD pt w/ L perinephric bleed/ hematoma HPI: The patient is a 58 y.o. year-old w/ PMH as below who presented to ED earlier this morning complaining of abdominal pain that started yesterday.  History of constipation but he had a BM yesterday.  He is on HD MWF, last HD was Wednesday. In ED BP high, HR/ RR okay. CT scan showed a new perinephric bleed w/ active extravasation. IR was consulted and took pt for angiogram/ embolization of the L renal artery, which was done this morning around 7- 8 am. Pt was admitted. We are asked to see for ESRD.    Pt seen in hospital room. Pt is in good spirits. Denies any SOB, CP. Abdomen is a bit sore. His last HD was on Wednesday.    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 [facility])    primary cancer is prostate   Anemia associated with chronic renal failure    Anxiety    Arthritis    CHF (congestive heart failure) (HCC)    Cirrhosis, nonalcoholic (HCC)    per pt possible from a medication , unsure ;   last ultrasound 08-09-2020 in epic no fibrosis   COPD (chronic obstructive pulmonary disease) (HCC)    Depression    ESRD on hemodialysis (HCC) 2009   Started HD Jan 2009;  ESRD secondary to hypertensive nephrosclerosis;  dialysis at [facility] at [facility] on MWF   First degree heart block    GERD (gastroesophageal reflux disease)    Hiatal hernia    History of acute respiratory failure 07/2013   admission;  HCAP w/ ARF with hypoxia   History of adenomatous polyp of colon    History of ascites    s/p paracentesis 01-31-2013 (5L) and last one 03-28-2013 (2.7L)   History of  community acquired pneumonia 08/08/2020   admission ; LLL , POA   History of heart murmur in childhood    History of MRSA infection 12/19/2012   hospital admission;  w/ sepsis MRSA bacteramia AVF infection   History of sepsis 03/2017   admission;   HCAP w/ sepsis   Hyperlipidemia    Hypertension    Hypertensive heart disease    cardiologist--- dr emerson leavens;  nuclear stress test 06-16-2013 intermediate risk w/ mid-distal anterior wall ischemia , not gated;  cardiac cath 07-13-2013 in epic showed normal coronaries and LVSF,  ef not assessed, LCEDP   Hypertensive nephrosclerosis, 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)    Intolerance to cold    due to anemia   Malignant neoplasm prostate [facility])    urologist--- dr bell/  radiation onologist--- dr patrcia;  dx 01/ 2023,  Gleason 4+3, PSA 32   Nausea and vomiting 10/19/2022   NICM (nonischemic cardiomyopathy) (HCC)    followed by cardiology;   last echo in epic 09-13-2020 ef 55-60%   OSA (obstructive sleep apnea) 2009   no  longer using cpap since the yr started 2009;   sleep study in epic 05-11-2007 severe osa   Pneumonia    PSVT (paroxysmal supraventricular tachycardia)    event monitor 08-01-2019  SR w/ SVT runs , rare PAC/ PVC   Secondary hyperparathyroidism    Stroke (HCC)    2023 weakness Lhand and Left leg   Wears glasses    Past Surgical History  Past Surgical History:  Procedure Laterality Date   AV FISTULA PLACEMENT Right 02/22/2013   Procedure:  CREATION  OF BRACHIAL CEPHALIC FISTULA RIGHT ARM;  Surgeon: Carlin FORBES Haddock, MD;  Location: [facility] OR;  Service: Vascular;  Laterality: Right;   AV FISTULA PLACEMENT Left 08/10/2014   Procedure: BASILIC VEIN TRANSPOSITION  ARTERIOVENOUS (AV) FISTULA CREATION LEFT UPPER ARM;  Surgeon: Lynwood JONETTA Collum, MD;  Location: [facility] OR;  Service: Vascular;  Laterality: Left;   AV FISTULA PLACEMENT, RADIOCEPHALIC Left 02/22/2007   @[facility] ;  Left lower arm   COLONOSCOPY   11/30/2018   by dr beavers   CYSTOSCOPY W/ RETROGRADES Bilateral 06/26/2022   Procedure: CYSTOSCOPY WITH BILATERAL RETROGRADE PYELOGRAM;  Surgeon: Carolee Sherwood JONETTA DOUGLAS, MD;  Location: [facility] ORS;  Service: Urology;  Laterality: Bilateral;   ESOPHAGOGASTRODUODENOSCOPY (EGD) WITH PROPOFOL  N/A 04/12/2013   Procedure: ESOPHAGOGASTRODUODENOSCOPY (EGD) WITH PROPOFOL ;  Surgeon: Elsie Cree, MD;  Location: [facility] ENDOSCOPY;  Service: Endoscopy;  Laterality: N/A;   EXCHANGE OF A DIALYSIS CATHETER Right 09/24/2021   Procedure: EXCHANGE OF A DIALYSIS CATHETER;  Surgeon: Eliza Lonni RAMAN, MD;  Location: [facility] OR;  Service: Vascular;  Laterality: Right;   GOLD SEED IMPLANT N/A 06/11/2021   Procedure: GOLD SEED IMPLANT;  Surgeon: Selma Donnice SAUNDERS, MD;  Location: [facility] ORS;  Service: Urology;  Laterality: N/A;   INSERTION OF DIALYSIS CATHETER N/A 12/23/2012   Procedure: INSERTION OF DIALYSIS CATHETER; ULTRASOUND GUIDED;  Surgeon: Lonni RAMAN Eliza, MD;  Location: [facility] OR;  Service: Vascular;  Laterality: N/A;   INSERTION OF DIALYSIS CATHETER  10/22/2015   Right IJ non-tunneled HD catheter, placed again in 2019   IR ANGIOGRAM SELECTIVE EACH ADDITIONAL VESSEL  12/25/2023   IR ANGIOGRAM SELECTIVE EACH ADDITIONAL VESSEL  12/25/2023   IR ANGIOGRAM SELECTIVE EACH ADDITIONAL VESSEL  12/25/2023   IR EMBO ART  VEN HEMORR LYMPH EXTRAV  INC GUIDE ROADMAPPING  12/25/2023   IR FLUORO GUIDE CV LINE RIGHT  08/18/2019   IR FLUORO GUIDE CV LINE RIGHT  10/07/2019   IR GENERIC HISTORICAL  10/22/2015   IR US  GUIDE VASC ACCESS RIGHT 10/22/2015 [facility]   IR GENERIC HISTORICAL  10/22/2015   IR FLUORO GUIDE CV LINE RIGHT 10/22/2015 [facility]   IR GENERIC HISTORICAL  10/23/2015   IR FLUORO GUIDE CV LINE RIGHT 10/23/2015 Rome Hall, MD [facility]   IR RENAL SELECTIVE  UNI INC S&I MOD SED  12/25/2023   IR US  GUIDE VASC ACCESS RIGHT  12/25/2023   LEFT HEART CATHETERIZATION WITH CORONARY ANGIOGRAM N/A 07/13/2013   Procedure: LEFT  HEART CATHETERIZATION WITH CORONARY ANGIOGRAM;  Surgeon: Candyce RAMAN Reek, MD;  Location: [facility] CATH LAB;  Service: Cardiovascular;  Laterality: N/A;   LIGATION OF ARTERIOVENOUS  FISTULA Left 12/22/2012   Procedure: LIGATION OF ARTERIOVENOUS  FISTULA;EXCISION OF LARGE ANEURYSMS;;  Surgeon: Carlin FORBES Haddock, MD;  Location: [facility] OR;  Service: Vascular;  Laterality: Left;   SPACE OAR INSTILLATION N/A 06/11/2021   Procedure: SPACE OAR INSTILLATION;  Surgeon: Selma Donnice SAUNDERS, MD;  Location: [facility] ORS;  Service: Urology;  Laterality: N/A;   UPPER GASTROINTESTINAL ENDOSCOPY  09/07/2018   by dr eda   Family History  Family History  Problem Relation Age of Onset   Hypertension Mother    Cerebrovascular Accident  Father    Hypertension Father    Congestive Heart Failure Brother    Asthma Brother    Stomach cancer Neg Hx    Rectal cancer Neg Hx    Esophageal cancer Neg Hx    Colon cancer Neg Hx    Social History  reports that he has quit smoking. His smoking use included cigarettes. He has a 8.5 pack-year smoking history. He has never used smokeless tobacco. He reports current alcohol use. He reports current drug use. Frequency: 4.00 times per week. Drug: Marijuana. Allergies  Allergies  Allergen Reactions   Venofer   [Ferric Oxide] Itching   Aspirin  Other (See Comments)    Avoids due to renal disease;  Made congestion worse    Home medications Prior to Admission medications   Medication Sig Start Date End Date Taking? Authorizing Provider  CINACALCET  HCL PO Take 120 mg by mouth every Monday, Wednesday, and Friday with hemodialysis. Provided and administered by dialysis clinic on Monday, Wednesday, Friday.   Yes [provider]  Doxercalciferol  (HECTOROL  IV) Inject 3 mcg into the vein every Monday, Wednesday, and Friday with hemodialysis. Provided and administered by dialysis clinic on Monday, Wednesday, Friday.   Yes [provider]  Methoxy PEG-Epoetin  Beta (MIRCERA IJ) Inject  150 mcg into the vein every 14 (fourteen) days. Provided and administered by dialysis clinic every 14 days.   Yes [provider]  albuterol  (VENTOLIN  HFA) 108 (90 Base) MCG/ACT inhaler Inhale 2 puffs into the lungs every 6 (six) hours as needed for wheezing or shortness of breath. 08/27/23   Howell Lunger, DO  amLODipine  (NORVASC ) 10 MG tablet Take 1 tablet (10 mg total) by mouth at bedtime. 12/05/21   Santo Stanly LABOR, MD  cinacalcet  (SENSIPAR ) 90 MG tablet Take 90 mg by mouth daily.    [provider]  cloNIDine  (CATAPRES  - DOSED IN MG/24 HR) 0.3 mg/24hr patch Place 0.3 mg onto the skin once a week.    [provider]  hydrALAZINE  (APRESOLINE ) 50 MG tablet Take 1 tablet (50 mg total) by mouth 2 (two) times daily. 12/05/21   Chandrasekhar, Stanly LABOR, MD  hydrOXYzine  (ATARAX ) 25 MG tablet Take 25 mg by mouth daily.    [provider]  isosorbide  dinitrate (ISORDIL ) 20 MG tablet Take 1 tablet (20 mg total) by mouth 2 (two) times daily. 12/05/21   Chandrasekhar, Stanly A, MD  latanoprost  (XALATAN ) 0.005 % ophthalmic solution Place 1 drop into both eyes at bedtime.    [provider]  metoprolol  tartrate (LOPRESSOR ) 50 MG tablet Take 1 tablet (50 mg total) by mouth 2 (two) times daily. 10/01/23 12/30/23  West, Katlyn D, NP  pantoprazole  (PROTONIX ) 20 MG tablet TAKE 1 TABLET(20 MG) BY MOUTH DAILY 01/09/22   [name], DO  rosuvastatin  (CRESTOR ) 10 MG tablet Take 1 tablet (10 mg total) by mouth daily. 12/24/23   Gomes, Adriana, DO  sevelamer  carbonate (RENVELA ) 800 MG tablet Take 3,200 mg by mouth 3 (three) times daily with meals.    [provider]  Tiotropium Bromide-Olodaterol (STIOLTO RESPIMAT ) 2.5-2.5 MCG/ACT AERS Inhale 2 puffs into the lungs daily. 08/27/23   Howell Lunger, DO     Vitals:   12/25/23 1015 12/25/23 1030 12/25/23 1100 12/25/23 1154  BP: (!) 148/84 137/80 133/76   Pulse: (!) 48 (!) 46 (!) 45   Resp: 18 18 19    Temp:     98.6 F (37 C)  TempSrc:    Axillary  SpO2: 95% 97% 96%  Weight:      Height:       Exam Gen alert, no distress, 2L [place] O2 Sclera anicteric, throat clear  No jvd or bruits Chest clear bilat to bases RRR no MRG Abd soft ntnd no mass or ascites +bs Ext no pitting LE or UE edema Neuro is alert, Ox 3 , nf    RIJ TDC intact  Home bp meds: Norvarc 10mg  every day Catapres  patch 0.3mg / 24hr Hydralazine  50mg  bid Metoprolol  50mg  bid    [facility] HD[facility] 4h  B400  86.7kg  2K bath  TDC  Heparin  4500 + 2000 midrun Last OP HD 11/19, post wt 90.8kg (+4kg) Usually doesn't get to dry wt, come off 1-6 kg over  Signs off early at 2.5- 3.5 hrs usually  Assessment/ Plan: L renal/ perinephric bleed: w/ assoc RP hemorrhage. CTA showed extravasation and IR did L renal artery embolization procedure early this morning. Per pmd / IR.  ESRD: on HD MWF. Has not missed HD.  HTN: bp's stable, getting most of his home meds Volume: wt's are off here. On O2, not in distress. No CXR. Will get port CXR.  Will plan 3-4 L UF as tolerated. Euvolemic on exam, chest exam was negative.  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  transfusing prn.    Myer Fret  MD [facility] 12/25/2023, 12:15 PM  Recent Labs  Lab 12/25/23 0147 12/25/23 0152 12/25/23 0837  HGB 9.6* 10.5* 7.4*  ALBUMIN  3.2*  --   --   CALCIUM  8.6*  --   --   CREATININE 10.23* 10.30*  --   K 3.5 3.4*  --    Inpatient medications:  amLODipine   10 mg Oral QHS   arformoterol   15 mcg Nebulization BID   And   umeclidinium bromide   1 puff Inhalation Daily   Chlorhexidine  Gluconate Cloth  6 each Topical Daily   cinacalcet   90 mg Oral Q breakfast   hydrALAZINE   50 mg Oral BID   isosorbide  dinitrate  20 mg Oral BID   metoprolol  tartrate  50 mg Oral BID   pantoprazole   40 mg Oral Daily   rosuvastatin   10 mg Oral Daily    albuterol , docusate sodium , mouth rinse, polyethylene glycol

## 2023-12-25 NOTE — ED Provider Notes (Signed)
 [place] EMERGENCY DEPARTMENT AT [facility] Provider Note   CSN: [id number] Arrival date & time: 12/25/23  0119     Patient presents with: Abdominal Pain   James Wall is a 58 y.o. male.   The history is provided by the patient.  Patient with extensive history including end-stage renal disease on dialysis, cirrhosis presents with abdominal pain. Patient reports this pain started over the past day and is worsening.  He reports associated nonbloody emesis.  He also reports some nonbloody diarrhea.  No chest pain or shortness of breath He does not produce urine.  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 [facility])    primary cancer is prostate   Anemia associated with chronic renal failure    Anxiety    Arthritis    CHF (congestive heart failure) (HCC)    Cirrhosis, nonalcoholic (HCC)    per pt possible from a medication , unsure ;   last ultrasound 08-09-2020 in epic no fibrosis   COPD (chronic obstructive pulmonary disease) (HCC)    Depression    ESRD on hemodialysis (HCC) 2009   Started HD Jan 2009;  ESRD secondary to hypertensive nephrosclerosis;  dialysis at [facility] on MWF   First degree heart block    GERD (gastroesophageal reflux disease)    Hiatal hernia    History of acute respiratory failure 07/2013   admission;  HCAP w/ ARF with hypoxia   History of adenomatous polyp of colon    History of ascites    s/p paracentesis 01-31-2013 (5L) and last one 03-28-2013 (2.7L)   History of community acquired pneumonia 08/08/2020   admission ; LLL , POA   History of heart murmur in childhood    History of MRSA infection 12/19/2012   hospital admission;  w/ sepsis MRSA bacteramia AVF infection   History of sepsis 03/2017   admission;   HCAP w/ sepsis   Hyperlipidemia     Hypertension    Hypertensive heart disease    cardiologist--- dr emerson leavens;  nuclear stress test 06-16-2013 intermediate risk w/ mid-distal anterior wall ischemia , not gated;  cardiac cath 07-13-2013 in epic showed normal coronaries and LVSF,  ef not assessed, LCEDP   Hypertensive nephrosclerosis, 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)    Intolerance to cold    due to anemia   Malignant neoplasm prostate [facility])    urologist--- dr bell/  radiation onologist--- dr patrcia;  dx 01/ 2023,  Gleason 4+3, PSA 32   Nausea and vomiting 10/19/2022   NICM (nonischemic cardiomyopathy) (HCC)    followed by cardiology;   last echo in epic 09-13-2020 ef 55-60%   OSA (obstructive sleep apnea) 2009   no  longer using cpap since the yr started 2009;   sleep study in epic 05-11-2007 severe osa   Pneumonia    PSVT (paroxysmal supraventricular tachycardia)    event monitor 08-01-2019  SR w/ SVT runs , rare PAC/ PVC   Secondary hyperparathyroidism    Stroke (HCC)    2023 weakness Lhand and Left leg   Wears glasses     Prior to Admission medications   Medication Sig Start Date End Date Taking? Authorizing Provider  acetaminophen  (TYLENOL ) 650 MG CR tablet Take 650 mg by mouth every 8 (  eight) hours as needed for pain. Pt takes 2 tablet as needed.    [provider]  albuterol  (VENTOLIN  HFA) 108 (90 Base) MCG/ACT inhaler Inhale 2 puffs into the lungs every 6 (six) hours as needed for wheezing or shortness of breath. 08/27/23   Howell Lunger, DO  amLODipine  (NORVASC ) 10 MG tablet Take 1 tablet (10 mg total) by mouth at bedtime. 12/05/21   Santo Stanly LABOR, MD  cloNIDine  (CATAPRES  - DOSED IN MG/24 HR) 0.3 mg/24hr patch Place 1 patch (0.3 mg total) onto the skin once a week on Sunday. 01/30/22   Santo Stanly LABOR, MD  Doxercalciferol  (HECTOROL  IV) 7 mcg as directed. 09/11/23 09/09/24  [provider]  hydrALAZINE  (APRESOLINE ) 50 MG tablet Take 1  tablet (50 mg total) by mouth 2 (two) times daily. 12/05/21   Chandrasekhar, Stanly LABOR, MD  hydrOXYzine  (ATARAX ) 25 MG tablet Take 1 tablet (25 mg total) by mouth daily. 12/24/23   Gomes, Adriana, DO  isosorbide  dinitrate (ISORDIL ) 20 MG tablet Take 1 tablet (20 mg total) by mouth 2 (two) times daily. 12/05/21   Chandrasekhar, Stanly A, MD  latanoprost  (XALATAN ) 0.005 % ophthalmic solution Place 1 drop into both eyes at bedtime. 12/24/23   Gomes, Adriana, DO  metoprolol  tartrate (LOPRESSOR ) 50 MG tablet Take 1 tablet (50 mg total) by mouth 2 (two) times daily. 10/01/23 12/30/23  West, Katlyn D, NP  pantoprazole  (PROTONIX ) 20 MG tablet TAKE 1 TABLET(20 MG) BY MOUTH DAILY 01/09/22   [name], DO  rosuvastatin  (CRESTOR ) 10 MG tablet Take 1 tablet (10 mg total) by mouth daily. 12/24/23   Gomes, Adriana, DO  SENSIPAR  90 MG tablet Take 90 mg by mouth daily. 05/07/22   [provider]  Tiotropium Bromide-Olodaterol (STIOLTO RESPIMAT ) 2.5-2.5 MCG/ACT AERS Inhale 2 puffs into the lungs daily. 08/27/23   Howell Lunger, DO    Allergies: Venofer   [ferric oxide] and Aspirin     Review of Systems  Constitutional:  Negative for fever.  Gastrointestinal:  Positive for abdominal pain, diarrhea and vomiting.    Updated Vital Signs BP (!) 174/99   Pulse 64   Temp 97.7 F (36.5 C)   Resp (!) 22   Ht 1.651 m (5' 5)   Wt 94.3 kg   SpO2 96%   BMI 34.61 kg/m   Physical Exam CONSTITUTIONAL: Chronically ill-appearing, diaphoretic HEAD: Normocephalic/atraumatic EYES: EOMI/PERRL, no icterus ENMT: Mucous membranes moist NECK: supple no meningeal signs CV: S1/S2 noted, no murmurs/rubs/gallops noted LUNGS: Lungs are clear to auscultation bilaterally, no apparent distress ABDOMEN: soft, protuberant, diffuse moderate tenderness throughout 4 quadrants with guarding noted NEURO: Pt is awake/alert/appropriate, moves all extremitiesx4.  No facial droop.   EXTREMITIES: pulses normal/equal, full ROM SKIN:  warm, color normal PSYCH: Anxious  (all labs ordered are listed, but only abnormal results are displayed) Labs Reviewed  COMPREHENSIVE METABOLIC PANEL WITH GFR - Abnormal; Notable for the following components:      Result Value   Glucose, Bld 148 (*)    BUN 37 (*)    Creatinine, Ser 10.23 (*)    Calcium  8.6 (*)    Albumin  3.2 (*)    Total Bilirubin 1.3 (*)    GFR, Estimated 5 (*)    Anion gap 18 (*)    All other components within normal limits  CBC - Abnormal; Notable for the following components:   RBC 2.98 (*)    Hemoglobin 9.6 (*)    HCT 30.2 (*)    MCV 101.3 (*)  RDW 17.3 (*)    All other components within normal limits  PROTIME-INR - Abnormal; Notable for the following components:   Prothrombin Time 16.6 (*)    INR 1.3 (*)    All other components within normal limits  I-STAT CHEM 8, ED - Abnormal; Notable for the following components:   Potassium 3.4 (*)    BUN 33 (*)    Creatinine, Ser 10.30 (*)    Glucose, Bld 140 (*)    Calcium , Ion 0.99 (*)    Hemoglobin 10.5 (*)    HCT 31.0 (*)    All other components within normal limits  LIPASE, BLOOD  TYPE AND SCREEN    EKG: EKG Interpretation Date/Time:  Friday December 25 2023 01:37:33 EST Ventricular Rate:  54 PR Interval:  194 QRS Duration:  100 QT Interval:  504 QTC Calculation: 477 R Axis:   -37  Text Interpretation: Sinus bradycardia Left axis deviation Nonspecific T wave abnormality Abnormal ECG When compared with ECG of 01-Oct-2023 10:53, T wave abnormality has improved Confirmed by Raford Lenis ([id number]) on 12/25/2023 1:47:10 AM  Radiology: CT ABDOMEN PELVIS W CONTRAST Result Date: 12/25/2023 EXAM: CT ABDOMEN AND PELVIS WITH CONTRAST 12/25/2023 03:44:33 AM TECHNIQUE: CT of the abdomen and pelvis was performed with the administration of 100 mL of iohexol  (OMNIPAQUE ) 350 MG/ML injection. Multiplanar reformatted images are provided for review. Automated exposure control, iterative reconstruction, and/or  weight-based adjustment of the mA/kV was utilized to reduce the radiation dose to as low as reasonably achievable. COMPARISON: Prior examination of 12/05/2023 and 10/19/2022. CLINICAL HISTORY: Abdominal pain, acute, nonlocalized. FINDINGS: LOWER CHEST: Extensive multivessel coronary artery calcifications. Mild cardiomegaly. Small hiatal hernia. HEPATOBILIARY: The liver is unremarkable. Gallbladder is unremarkable. No biliary ductal dilatation. SPLEEN: No acute abnormality. PANCREAS: No acute abnormality. ADRENAL GLANDS: No acute abnormality. KIDNEYS, URETERS AND BLADDER: The kidneys are markedly atrophic, similar to prior examination, in keeping with changes of end-stage renal disease. Innumerable simple cysts are seen within the kidneys bilaterally in keeping with probable cystic change of chronic renal failure. However, since the prior examination there has developed a large left perinephric hematoma with extensive hemorrhage within and extending into the retroperitoneum. 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 There is trace high attenuation material within the decompressed bladder lumen in keeping with a small amount of hemorrhagic fluid or debris. GI AND BOWEL: Large retroperitoneal/perinephric hematoma displaces the bowel from the left hemiabdomen. The stomach, small bowel, and large bowel are otherwise unremarkable. Appendix normal. PERITONEUM AND RETROPERITONEUM: Fluid is also seen tracking around the pancreas and duodenum superiorly. There is mild bland ascites, however, no evidence of hemoperitoneum. VASCULATURE: Extensive aortoiliac atherosclerotic calcifications. No aortic aneurysm. LYMPH NODES: No lymphadenopathy. REPRODUCTIVE ORGANS: Fiducial markers are seen within the prostate gland. BONES AND SOFT TISSUES: The osseous structures are  diffusely sclerotic in keeping with changes of renal osteodystrophy. No acute bone abnormality. No lytic or blastic bone lesion. Moderate diffuse subcutaneous edema in keeping with anasarca. IMPRESSION: 1. 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 Associated fluid tracks around the pancreas and duodenum. 2. Mild bland ascites. No hemoperitoneum. 3. Moderate diffuse subcutaneous edema consistent with anasarca. 4. Markedly atrophic kidneys with innumerable simple cysts, compatible with end-stage renal disease; unchanged from prior. 5. Extensive multivessel coronary artery calcifications  and mild cardiomegaly. 6. Small hiatal hernia. I discussed these findings personally with Dr. Midge at 4:21 am Electronically signed by: Dorethia Molt MD 12/25/2023 04:26 AM EST RP Workstation: [id number]     .Critical Care  Performed by: [name], MD Authorized by: [name], MD   Critical care provider statement:    Critical care time (minutes):  80   Critical care start time:  12/25/2023 4:00 AM   Critical care end time:  12/25/2023 5:20 AM   Critical care time was exclusive of:  Separately billable procedures and treating other patients   Critical care was necessary to treat or prevent imminent or life-threatening deterioration of the following conditions:  Dehydration, renal failure, sepsis and shock   Critical care was time spent personally by me on the following activities:  Obtaining history from patient or surrogate, examination of patient, discussions with consultants, evaluation of patient's response to treatment, pulse oximetry, ordering and review of radiographic studies, ordering and review of laboratory studies, ordering and performing treatments and interventions, re-evaluation of patient's condition, review of old charts and development of treatment  plan with patient or surrogate   I assumed direction of critical care for this patient from another provider in my specialty: no     Care discussed with: admitting provider      Medications Ordered in the ED  HYDROmorphone  (DILAUDID ) injection 1 mg (has no administration in time range)  fentaNYL  (SUBLIMAZE ) injection 50 mcg (50 mcg Intravenous Given 12/25/23 0257)  ondansetron  (ZOFRAN ) injection 4 mg (4 mg Intravenous Given 12/25/23 0257)  fentaNYL  (SUBLIMAZE ) injection 100 mcg (100 mcg Intravenous Given 12/25/23 0316)  iohexol  (OMNIPAQUE ) 350 MG/ML injection 100 mL (100 mLs Intravenous Contrast Given 12/25/23 0345)  fentaNYL  (SUBLIMAZE ) injection 100 mcg (100 mcg Intravenous Given 12/25/23 0435)  fentaNYL  (SUBLIMAZE ) injection 100 mcg (100 mcg Intravenous Given 12/25/23 0535)    Clinical Course as of 12/25/23 0546  Fri Dec 25, 2023  0422 Discussed with radiologist.  Patient found to have active extravasation into the left kidney where there is a mass.  He recommends vascular IR consultation [DW]  [phone] Discussed with on-call vascular surgery, they recommend IR [DW]  [phone] Discussed with ICU Dr. Layman, they will evaluate for admission. After IR consultation, may need urology consultation [DW]  [phone] Discussed with urology, Dr. Devere, he recommends IR consultation [DW]  0518 Discussed with Dr. Luverne with radiology who will evaluate the imaging [DW]  0540 Dr. Luverne plans to take patient to the IR for embolization This was endorsed to the ICU team who will admit the patient [DW]    Clinical Course User Index [DW] [name], MD                                 Medical Decision Making Amount and/or Complexity of Data Reviewed Labs: ordered. Radiology: ordered.  Risk Prescription drug management. Decision regarding hospitalization.   This patient presents to the ED for concern of abdominal pain, this involves an extensive number of treatment options, and is a complaint  that carries with it a high risk of complications and morbidity.  The differential diagnosis includes but is not limited to cholecystitis, cholelithiasis, pancreatitis, gastritis, peptic ulcer disease, appendicitis, bowel obstruction, bowel perforation, diverticulitis, AAA, ischemic bowel, acute coronary syndrome    Comorbidities that complicate the patient evaluation: Patient's presentation is complicated by their history of ESRD  Social Determinants of Health: Patient's former tobacco  use  increases the complexity of managing their presentation  Additional history obtained: Records reviewed Primary Care Documents  Lab Tests: I Ordered, and personally interpreted labs.  The pertinent results include: End-stage renal disease  Imaging Studies ordered: I ordered imaging studies including CT scan abdomen pelvis  I independently visualized and interpreted imaging which showed perinephric hematoma, active extravasation I agree with the radiologist interpretation   Medicines ordered and prescription drug management: I ordered medication including fentanyl  for pain Reevaluation of the patient after these medicines showed that the patient    stayed the same  Critical Interventions:   admission, will obtain embolization  Consultations Obtained: I requested consultation with the admitting physician ICU Dr. Layman, radiologist  , and consultant [place], and discussed  findings as well as pertinent plan - they recommend: will go for IR embolization  Reevaluation: After the interventions noted above, I reevaluated the patient and found that they have :stayed the same  Complexity of problems addressed: Patient's presentation is most consistent with  acute presentation with potential threat to life or bodily function  Disposition: After consideration of the diagnostic results and the patient's response to treatment,  I feel that the patent would benefit from admission  .         Final diagnoses:  Left renal mass  Perinephric hematoma  Retroperitoneal hematoma    ED Discharge Orders     None          [name], MD 12/25/23 [phone]

## 2023-12-25 NOTE — ED Notes (Signed)
 Patient O2 desated after medication administration. Placed on 4L nasal cannula and patient currently sating at baseline.

## 2023-12-25 NOTE — Progress Notes (Signed)
 Pt resting comfortably after Dilaudid  given for abdominal pain. BP drifting down post Dilaudid  admin (SBPs 90s-100s). Dr. Haze paged regarding BPs and provider endorsed to hold all of tonight's antihypertensives.

## 2023-12-25 NOTE — Progress Notes (Signed)
   12/25/23 2032  Vitals  BP 101/65  Pulse Rate (!) 48  Resp 19  Oxygen Therapy  SpO2 (!) 88 %  O2 Device Nasal Cannula  Patient Activity (if Appropriate) In bed  Pulse Oximetry Type Continuous  Oximetry Probe Site Changed No  Post Treatment  Dialyzer Clearance Clear  Liters Processed 64  Fluid Removed (mL) 2500 mL  Tolerated HD Treatment Yes   Pt requested off 30 mins early d/t abdominal pain from gas, Pt states that he ate ice chips which gives him gas. VSS

## 2023-12-25 NOTE — Progress Notes (Signed)
 RT placed patient on 8L salter HFNC due to desaturation.

## 2023-12-25 NOTE — Sedation Documentation (Signed)
 Patient transported to 4N30 with RN.

## 2023-12-25 NOTE — Progress Notes (Signed)
 eLink Physician-Brief Progress Note Patient Name: James Wall DOB: 1965-06-07 MRN: [id number]   Date of Service  12/25/2023  HPI/Events of Note  Noncamera room 58 yo esrd with kidney mass/hematoma and active extravasation into retroperitoneal space.  9 out of 10 abdominal pain, feels similar to the pain that brought him to the hospital, abdomen is taut and distended, bradycardic Status post embolization  eICU Interventions  No indication to treat bradycardia Add Dilaudid  as needed, received this effectively during the day   0325 -hemoglobin down to 6.1, transfuse 1 unit PRBC.  No obvious bleeding, trend hemoglobin  Intervention Category Intermediate Interventions: Pain - evaluation and management  Markham Dumlao 12/25/2023, 10:33 PM

## 2023-12-25 NOTE — H&P (Signed)
 NAME:  James Wall, MRN:  [id number], DOB:  1965-08-16, LOS: 0 ADMISSION DATE:  12/25/2023, CONSULTATION DATE: 11/21 REFERRING MD: Dr. Midge, CHIEF COMPLAINT: Abdominal pain  History of Present Illness:  58 year old male with past medical history as below, which is significant for ESRD on HD, COPD, hypertension, heart failure with reduced EF, prostate cancer, cirrhosis, and stroke.   he initially presented to the emergency department on November 1 with complaints of generalized abdominal pain.  Also complained of vomiting.  Pain was located in the upper abdomen.  CT of the abdomen pelvis was obtained and demonstrated some known perinephritic lesions which had enlarged since prior imaging.  He was discharged with recommendations for follow-up with urology/nephrology.  He again presented to [facility] emergency department 11/21 with acute onset of upper abdominal pain the evening prior.  Pain has associated nausea and vomiting.  Pain was severe and refractory to several doses of fentanyl .  CT scan was repeated and patient was found to have perinephritic hemorrhage with active extravasation into the left kidney at the location of previously identified mass as well as extensive retroperitoneal hematoma.  The case has been discussed with vascular surgery and urology who recommended IR evaluation. IR plans to take him for angiogram and hopeful embolism. PCCM to admit for close monitoring.  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), Anemia associated with chronic renal failure, Anxiety, Arthritis, CHF (congestive heart failure) (HCC), Cirrhosis, nonalcoholic (HCC), COPD (chronic obstructive pulmonary disease) (HCC), Depression, ESRD on hemodialysis (HCC) (2009), First degree heart block, GERD (gastroesophageal reflux disease), Hiatal hernia, History of acute respiratory failure (07/2013), History of adenomatous polyp of  colon, History of ascites, History of community acquired pneumonia (08/08/2020), History of heart murmur in childhood, History of MRSA infection (12/19/2012), History of sepsis (03/2017), Hyperlipidemia, Hypertension, Hypertensive heart disease, Hypertensive nephrosclerosis, 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), Intolerance to cold, Malignant neoplasm prostate (HCC), Nausea and vomiting (10/19/2022), NICM (nonischemic cardiomyopathy) (HCC), OSA (obstructive sleep apnea) (2009), Pneumonia, PSVT (paroxysmal supraventricular tachycardia), Secondary hyperparathyroidism, Stroke (HCC), and Wears glasses.   Significant Hospital Events: Including procedures, antibiotic start and stop dates in addition to other pertinent events   11/21 admit for perinephritic hemorrhage, to IR.   Interim History / Subjective:    Objective    Blood pressure (!) 174/99, pulse 64, temperature 97.7 F (36.5 C), resp. rate (!) 22, height 5' 5 (1.651 m), weight 94.3 kg, SpO2 96%.       No intake or output data in the 24 hours ending 12/25/23 0540 Filed Weights   12/25/23 0126  Weight: 94.3 kg    Examination: General: Overweight middle aged male in distress from significant abdominal pain. HENT: [place]/AT, PERRL, no JVD Lungs: Clear, unlabored Cardiovascular: RRR, no mRG Abdomen: Exquisitely tender.  Extremities: No acute deformity Neuro: Awake, alert, oriented,   Resolved problem list   Assessment and Plan   Large left perinephritic hematoma with active extravasation and extensive retroperitoneal hemorrhage.  Likely secondary to rupture of known renal mass. - Trend H&H - To IR for angiogram and hopeful embolism - Multiple doses of fentanyl  received without benefit, severe pain continues.  Dilaudid  ordered - May need improved blood pressure control, hopefully pain control will achieve that goal. - Urology, and vascular surgery consulted in the emergency department  ESRD on  HD Hypokalemia - Consult nephrology in a.m.  Acute blood loss  anemia  Anemia of chronic illness Baseline hemoglobin 11.3, 9.6 on admission. - Trending hemoglobin and hematocrit - Transfuse for hemoglobin less than 8 in the setting of active bleed - SCDs only for VTE prophylaxis  HFrEF Hypertension - Holding home amlodipine , clonidine , hydralazine , isosorbide , metoprolol  pending hemorrhage control - Telemetry monitoring   Labs   CBC: Recent Labs  Lab 12/25/23 0147 12/25/23 0152  WBC 6.7  --   HGB 9.6* 10.5*  HCT 30.2* 31.0*  MCV 101.3*  --   PLT 176  --     Basic Metabolic Panel: Recent Labs  Lab 12/25/23 0147 12/25/23 0152  NA 142 139  K 3.5 3.4*  CL 98 99  CO2 26  --   GLUCOSE 148* 140*  BUN 37* 33*  CREATININE 10.23* 10.30*  CALCIUM  8.6*  --    GFR: Estimated Creatinine Clearance: 8.1 mL/min (A) (by C-G formula based on SCr of 10.3 mg/dL (H)). Recent Labs  Lab 12/25/23 0147  WBC 6.7    Liver Function Tests: Recent Labs  Lab 12/25/23 0147  AST 21  ALT 14  ALKPHOS 78  BILITOT 1.3*  PROT 6.7  ALBUMIN  3.2*   Recent Labs  Lab 12/25/23 0147  LIPASE 33   No results for input(s): AMMONIA in the last 168 hours.  ABG    Component Value Date/Time   PHART 7.453 (H) 12/23/2017 2129   PCO2ART 43.9 12/23/2017 2129   PO2ART 170.0 (H) 12/23/2017 2129   HCO3 30.8 (H) 12/23/2017 2129   TCO2 28 12/25/2023 0152   O2SAT 100.0 12/23/2017 2129     Coagulation Profile: Recent Labs  Lab 12/25/23 0509  INR 1.3*    Cardiac Enzymes: No results for input(s): CKTOTAL, CKMB, CKMBINDEX, TROPONINI in the last 168 hours.  HbA1C: Hgb A1c MFr Bld  Date/Time Value Ref Range Status  09/14/2021 09:38 PM 4.1 (L) 4.8 - 5.6 % Final    Comment:    (NOTE) Pre diabetes:          5.7%-6.4%  Diabetes:              >6.4%  Glycemic control for   <7.0% adults with diabetes   02/15/2013 10:46 AM 4.2 <5.7 % Final    Comment:                                                                            According to the ADA Clinical Practice Recommendations for 2011, when HbA1c is used as a screening test:     >=6.5%   Diagnostic of Diabetes Mellitus            (if abnormal result is confirmed)   5.7-6.4%   Increased risk of developing Diabetes Mellitus   References:Diagnosis and Classification of Diabetes Mellitus,Diabetes Care,2011,34(Suppl 1):S62-S69 and Standards of Medical Care in         Diabetes - 2011,Diabetes Care,2011,34 (Suppl 1):S11-S61.      CBG: No results for input(s): GLUCAP in the last 168 hours.  Review of Systems:   Limited by pain Bolds are positive  Constitutional: weight loss, gain, night sweats, Fevers, chills, fatigue .  HEENT: headaches, Sore throat, sneezing, nasal congestion, post nasal drip, Difficulty swallowing, Tooth/dental problems,  visual complaints visual changes, ear ache CV:  chest pain, radiates:,Orthopnea, PND, swelling in lower extremities, dizziness, palpitations, syncope.  GI  heartburn, indigestion, abdominal pain, nausea, vomiting, diarrhea, change in bowel habits, loss of appetite, bloody stools.  Resp: cough, productive: , hemoptysis, dyspnea, chest pain, pleuritic.  Skin: rash or itching or icterus GU: dysuria, change in color of urine, urgency or frequency. flank pain, hematuria  MS: joint pain or swelling. decreased range of motion  Psych: change in mood or affect. depression or anxiety.  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), Anemia associated with chronic renal failure, Anxiety, Arthritis, CHF (congestive heart failure) (HCC), Cirrhosis, nonalcoholic (HCC), COPD (chronic obstructive pulmonary disease) (HCC), Depression, ESRD on hemodialysis (HCC) (2009), First degree heart block, GERD (gastroesophageal reflux disease), Hiatal hernia, History  of acute respiratory failure (07/2013), History of adenomatous polyp of colon, History of ascites, History of community acquired pneumonia (08/08/2020), History of heart murmur in childhood, History of MRSA infection (12/19/2012), History of sepsis (03/2017), Hyperlipidemia, Hypertension, Hypertensive heart disease, Hypertensive nephrosclerosis, 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), Intolerance to cold, Malignant neoplasm prostate (HCC), Nausea and vomiting (10/19/2022), NICM (nonischemic cardiomyopathy) (HCC), OSA (obstructive sleep apnea) (2009), Pneumonia, PSVT (paroxysmal supraventricular tachycardia), Secondary hyperparathyroidism, Stroke (HCC), and Wears glasses.   Surgical History:   Past Surgical History:  Procedure Laterality Date   AV FISTULA PLACEMENT Right 02/22/2013   Procedure:  CREATION  OF BRACHIAL CEPHALIC FISTULA RIGHT ARM;  Surgeon: Carlin FORBES Haddock, MD;  Location: [facility] OR;  Service: Vascular;  Laterality: Right;   AV FISTULA PLACEMENT Left 08/10/2014   Procedure: BASILIC VEIN TRANSPOSITION  ARTERIOVENOUS (AV) FISTULA CREATION LEFT UPPER ARM;  Surgeon: Lynwood JONETTA Collum, MD;  Location: [facility] OR;  Service: Vascular;  Laterality: Left;   AV FISTULA PLACEMENT, RADIOCEPHALIC Left 02/22/2007   @[facility] ;  Left lower arm   COLONOSCOPY  11/30/2018   by dr beavers   CYSTOSCOPY W/ RETROGRADES Bilateral 06/26/2022   Procedure: CYSTOSCOPY WITH BILATERAL RETROGRADE PYELOGRAM;  Surgeon: Carolee Sherwood JONETTA DOUGLAS, MD;  Location: [facility] ORS;  Service: Urology;  Laterality: Bilateral;   ESOPHAGOGASTRODUODENOSCOPY (EGD) WITH PROPOFOL  N/A 04/12/2013   Procedure: ESOPHAGOGASTRODUODENOSCOPY (EGD) WITH PROPOFOL ;  Surgeon: Elsie Cree, MD;  Location: [facility] ENDOSCOPY;  Service: Endoscopy;  Laterality: N/A;   EXCHANGE OF A DIALYSIS CATHETER Right 09/24/2021   Procedure: EXCHANGE OF A DIALYSIS CATHETER;  Surgeon: Eliza Lonni RAMAN, MD;  Location: [facility] OR;  Service: Vascular;  Laterality: Right;    GOLD SEED IMPLANT N/A 06/11/2021   Procedure: GOLD SEED IMPLANT;  Surgeon: Selma Donnice SAUNDERS, MD;  Location: [facility] ORS;  Service: Urology;  Laterality: N/A;   INSERTION OF DIALYSIS CATHETER N/A 12/23/2012   Procedure: INSERTION OF DIALYSIS CATHETER; ULTRASOUND GUIDED;  Surgeon: Lonni RAMAN Eliza, MD;  Location: [facility] OR;  Service: Vascular;  Laterality: N/A;   INSERTION OF DIALYSIS CATHETER  10/22/2015   Right IJ non-tunneled HD catheter, placed again in 2019   IR FLUORO GUIDE CV LINE RIGHT  08/18/2019   IR FLUORO GUIDE CV LINE RIGHT  10/07/2019   IR GENERIC HISTORICAL  10/22/2015   IR US  GUIDE VASC ACCESS RIGHT 10/22/2015 [facility]-INTER[facility] RAD   IR GENERIC HISTORICAL  10/22/2015   IR FLUORO GUIDE CV LINE RIGHT 10/22/2015 [facility]   IR GENERIC HISTORICAL  10/23/2015   IR FLUORO GUIDE  CV LINE RIGHT 10/23/2015 Rome Hall, MD [facility]-INTERV RAD   LEFT HEART CATHETERIZATION WITH CORONARY ANGIOGRAM N/A 07/13/2013   Procedure: LEFT HEART CATHETERIZATION WITH CORONARY ANGIOGRAM;  Surgeon: Candyce GORMAN Reek, MD;  Location: [facility] CATH LAB;  Service: Cardiovascular;  Laterality: N/A;   LIGATION OF ARTERIOVENOUS  FISTULA Left 12/22/2012   Procedure: LIGATION OF ARTERIOVENOUS  FISTULA;EXCISION OF LARGE ANEURYSMS;;  Surgeon: Carlin FORBES Haddock, MD;  Location: [facility] OR;  Service: Vascular;  Laterality: Left;   SPACE OAR INSTILLATION N/A 06/11/2021   Procedure: SPACE OAR INSTILLATION;  Surgeon: Selma Donnice SAUNDERS, MD;  Location: [facility] ORS;  Service: Urology;  Laterality: N/A;   UPPER GASTROINTESTINAL ENDOSCOPY  09/07/2018   by dr eda     Social History:   reports that he has quit smoking. His smoking use included cigarettes. He has a 8.5 pack-year smoking history. He has never used smokeless tobacco. He reports current alcohol use. He reports current drug use. Frequency: 4.00 times per week. Drug: Marijuana.   Family History:  His family history includes Asthma in his brother; Cerebrovascular Accident in his father; Congestive  Heart Failure in his brother; Hypertension in his father and mother. There is no history of Stomach cancer, Rectal cancer, Esophageal cancer, or Colon cancer.   Allergies Allergies  Allergen Reactions   Venofer   [Ferric Oxide] Itching   Aspirin  Other (See Comments)    Avoids due to renal disease;  Made congestion worse      Home Medications  Prior to Admission medications   Medication Sig Start Date End Date Taking? Authorizing Provider  acetaminophen  (TYLENOL ) 650 MG CR tablet Take 650 mg by mouth every 8 (eight) hours as needed for pain. Pt takes 2 tablet as needed.    [provider]  albuterol  (VENTOLIN  HFA) 108 (90 Base) MCG/ACT inhaler Inhale 2 puffs into the lungs every 6 (six) hours as needed for wheezing or shortness of breath. 08/27/23   Howell Lunger, DO  amLODipine  (NORVASC ) 10 MG tablet Take 1 tablet (10 mg total) by mouth at bedtime. 12/05/21   Santo Stanly LABOR, MD  cloNIDine  (CATAPRES  - DOSED IN MG/24 HR) 0.3 mg/24hr patch Place 1 patch (0.3 mg total) onto the skin once a week on Sunday. 01/30/22   Santo Stanly LABOR, MD  Doxercalciferol  (HECTOROL  IV) 7 mcg as directed. 09/11/23 09/09/24  [provider]  hydrALAZINE  (APRESOLINE ) 50 MG tablet Take 1 tablet (50 mg total) by mouth 2 (two) times daily. 12/05/21   Santo Stanly LABOR, MD  hydrOXYzine  (ATARAX ) 25 MG tablet Take 1 tablet (25 mg total) by mouth daily. 12/24/23   Gomes, Adriana, DO  isosorbide  dinitrate (ISORDIL ) 20 MG tablet Take 1 tablet (20 mg total) by mouth 2 (two) times daily. 12/05/21   Chandrasekhar, Stanly LABOR, MD  latanoprost  (XALATAN ) 0.005 % ophthalmic solution Place 1 drop into both eyes at bedtime. 12/24/23   Gomes, Adriana, DO  metoprolol  tartrate (LOPRESSOR ) 50 MG tablet Take 1 tablet (50 mg total) by mouth 2 (two) times daily. 10/01/23 12/30/23  West, Katlyn D, NP  pantoprazole  (PROTONIX ) 20 MG tablet TAKE 1 TABLET(20 MG) BY MOUTH DAILY 01/09/22   [name], DO   rosuvastatin  (CRESTOR ) 10 MG tablet Take 1 tablet (10 mg total) by mouth daily. 12/24/23   Gomes, Adriana, DO  SENSIPAR  90 MG tablet Take 90 mg by mouth daily. 05/07/22   [provider]  Tiotropium Bromide-Olodaterol (STIOLTO RESPIMAT ) 2.5-2.5 MCG/ACT AERS Inhale 2 puffs into the lungs daily. 08/27/23  Howell Lunger, DO     Critical care time: 43 min     Deward Eastern, AGACNP-BC [facility]  See Amion for personal pager PCCM on call pager [phone] until 7pm. Please call Elink 7p-7a. [phone]  12/25/2023 6:20 AM

## 2023-12-25 NOTE — ED Notes (Signed)
 Pt ambulated from w/c to recliner in triage with 1 person assist.

## 2023-12-25 NOTE — Progress Notes (Signed)
 Harlene, RN with [facility] paged for attending regarding pt's complaint of 9/10 pain similar in nature to pain he had on admission and absence of no PRN pain medication ordered. Also, conveyed pt's HR (brady 30s-50s). [facility]Link RN spoke with provider on-call, Dr. Haze and orders received.Will implement and continue to monitor.

## 2023-12-25 NOTE — Procedures (Signed)
 Interventional Radiology Procedure Note  Procedure: Left renal arteriography; left renal embolization  Complications: None  Estimated Blood Loss: < 10 mL  Findings: Left renal arteriography demonstrates extravasation from distal mid/lower branch. Embolization performed at level of segmental branch extending into distal main renal artery with cessation of extravasation and significant diminished flow to kidney.  Marcey DASEN. Luverne, M.D Pager:  [phone]

## 2023-12-25 NOTE — ED Notes (Signed)
 Patient to IR. Will have ICU bed to go to after procedure.

## 2023-12-25 NOTE — Sedation Documentation (Signed)
 Bedside report given to RN. Femoral site assessed - Level 0, no hematoma, dressing is clean, dry, and intact. Pulses also assessed bilaterally.

## 2023-12-25 NOTE — Sedation Documentation (Signed)
 Handoff received from [place], [place].

## 2023-12-25 NOTE — ED Notes (Signed)
 Pt reports does not produce urine.

## 2023-12-25 NOTE — ED Triage Notes (Addendum)
 Pt to ED via [facility] c/o abdominal pain that started today. This is a recurrent problem. Hx constipation, but last BM today, pt diaphoretic, n/v.  Hx dialysis with binder . Last treatment yesterday.    Last VS: 216/106, RR 20, 99%2L, HR 50

## 2023-12-25 NOTE — Hospital Course (Addendum)
 James Wall is a 58 y.o. male w/PMHx of ESRD on HD, chronic HFrEF, HTN, COPD, prostate cancer, liver cirrhosis, who was admitted for a large left perinephric hematoma w/ active extravasation now s/p embolization of vessel by IR.   Perinephric Hematoma Acute anemia  Presented with abdominal pain to the ED. Hgb on admission 9.6 and down-trending to 6.1 requiring 1 unit transfusion with improvement to 7.1. CT AP significant for large left perinephric hematoma with extravasation and extensive retroperitoneal hemorrhage due to spontaneous rupture of left renal neoplasm. He underwent an IR embolization of the left renal artery on 11/21 and was admitted to the ICU for monitoring s/p procedure. After procedure and stabilization, patient was transferred to FMTS on 11/22. Hospital course was complicated by persistent abdominal pain and anemia requiring additional blood products. Throughout hospitalization, patient received a total of 3 units PRBC with stabilization of Hgb prior to discharge. Urology recommended outpatient follow up, no acute inpatient intervention. Abdominal pain had become severe with severe tenderness to palpation on abdominal exam on 11/24, so stat repeat CTAP with contrast was ordered and showed stable and slightly decreased in size L retroperitoneal hematoma. Pain regimen was tapered down to Norco 5-325mg  q6h PRN and patient had pain relief by time of discharge with pain medication. Patient ultimately discharged home with home health PT/OT.   Sinus pause Chronic sinus bradycardia The patient experienced a 5.33 second asymptomatic pause the morning of 11/24. Metoprolol  was held. Bradycardia remained consistent with HR in the 40-50s. Cardiology was consulted with diagnosis of high degree AV block secondary to OSA and recommendations to discontinue metoprolol  50mg  BID, place a Zio monitor to look for any signals of either further pauses of PSVT off of metoprolol  outpatient, and obtain  outpatient sleep study. Other cardiology recommendations include increasing Crestor  to 20mg  daily for multivessel CAD and adding hydralazine  25mg  TID. The patient had another 5.4 second pause the morning of 11/26 which was also asymptomatic. Per Cardiology, patient not a good pacemaker candidate given possibility of infection risks due to his HD and recommended to continue supportive care.   Tachycardia Following medication changes as mentioned above for bradycardia, patient with intermittent asymptomatic tachycardia to 120s at rest and with exertion. 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 Per cardiology, plan for Zio patch as above and continue to hold metoprolol  at this time. Plan for medications as above, Zio patch, and cardiology follow up outpatient.   Constipation  Patient experienced constipation during admission, likely secondary to opiate medications for abdominal pain as above. Patient required oral, suppository, and enema bowel regimen. Constipation resolved prior to discharge.  ESRD Nephrology was consulted and dialysis was continued while patient was admitted.    Other chronic conditions were medically managed with home medications and formulary alternatives as necessary (HFpEF, GERD, HLD, Secondary hyperparathyroidism, COPD)  PCP follow up recommendations:  Ensure cardiology follow up and Zio patch palcement.  Follow up with urology.   Follow up with nephrology.  Encourage outpatient sleep study  Monitor for bleeding signs

## 2023-12-26 DIAGNOSIS — I10 Essential (primary) hypertension: Secondary | ICD-10-CM | POA: Insufficient documentation

## 2023-12-26 LAB — BASIC METABOLIC PANEL WITH GFR
Anion gap: 13 (ref 5–15)
BUN: 23 mg/dL — ABNORMAL HIGH (ref 6–20)
CO2: 29 mmol/L (ref 22–32)
Calcium: 8.5 mg/dL — ABNORMAL LOW (ref 8.9–10.3)
Chloride: 95 mmol/L — ABNORMAL LOW (ref 98–111)
Creatinine, Ser: 7.57 mg/dL — ABNORMAL HIGH (ref 0.61–1.24)
GFR, Estimated: 8 mL/min — ABNORMAL LOW (ref >60)
Glucose, Bld: 93 mg/dL (ref 70–99)
Potassium: 4 mmol/L (ref 3.5–5.1)
Sodium: 137 mmol/L (ref 135–145)

## 2023-12-26 LAB — CBC
HCT: 19.4 % — ABNORMAL LOW (ref 39.0–52.0)
Hemoglobin: 6.1 g/dL — CL (ref 13.0–17.0)
MCH: 32.1 pg (ref 26.0–34.0)
MCHC: 31.4 g/dL (ref 30.0–36.0)
MCV: 102.1 fL — ABNORMAL HIGH (ref 80.0–100.0)
Platelets: 136 K/uL — ABNORMAL LOW (ref 150–400)
RBC: 1.9 MIL/uL — ABNORMAL LOW (ref 4.22–5.81)
RDW: 17.5 % — ABNORMAL HIGH (ref 11.5–15.5)
WBC: 5.9 K/uL (ref 4.0–10.5)
nRBC: 0.3 % — ABNORMAL HIGH (ref 0.0–0.2)

## 2023-12-26 LAB — MAGNESIUM: Magnesium: 2.1 mg/dL (ref 1.7–2.4)

## 2023-12-26 LAB — PREPARE RBC (CROSSMATCH)

## 2023-12-26 LAB — HEMOGLOBIN AND HEMATOCRIT, BLOOD
HCT: 22.2 % — ABNORMAL LOW (ref 39.0–52.0)
HCT: 21.2 % — ABNORMAL LOW (ref 39.0–52.0)
HCT: 24 % — ABNORMAL LOW (ref 39.0–52.0)
Hemoglobin: 7.2 g/dL — ABNORMAL LOW (ref 13.0–17.0)
Hemoglobin: 7 g/dL — ABNORMAL LOW (ref 13.0–17.0)
Hemoglobin: 7.9 g/dL — ABNORMAL LOW (ref 13.0–17.0)

## 2023-12-26 LAB — HEPATITIS B SURFACE ANTIBODY, QUANTITATIVE: Hep B S AB Quant (Post): 5758 m[IU]/mL

## 2023-12-26 LAB — PHOSPHORUS: Phosphorus: 5.2 mg/dL — ABNORMAL HIGH (ref 2.5–4.6)

## 2023-12-26 MED ORDER — ONDANSETRON 4 MG PO TBDP: 4.0000 mg | ORAL_TABLET | Freq: Three times a day (TID) | ORAL | Status: DC | PRN

## 2023-12-26 MED ORDER — CHLORHEXIDINE GLUCONATE CLOTH 2 % EX PADS
6.0000 | MEDICATED_PAD | Freq: Every day | CUTANEOUS | Status: DC
  Administered 2023-12-27 – 2023-12-28 (×2): 6 via TOPICAL

## 2023-12-26 MED ORDER — LORATADINE 10 MG PO TABS
10.0000 mg | ORAL_TABLET | Freq: Every day | ORAL | Status: DC
  Administered 2023-12-26 – 2023-12-31 (×6): 10 mg via ORAL
  Filled 2023-12-26 (×7): qty 1

## 2023-12-26 MED ORDER — OXYCODONE-ACETAMINOPHEN 5-325 MG PO TABS
1.0000 | ORAL_TABLET | ORAL | Status: DC | PRN
Start: 2023-12-26 — End: 2023-12-26

## 2023-12-26 MED ORDER — SODIUM CHLORIDE 0.9% IV SOLUTION: Freq: Once | INTRAVENOUS | Status: AC

## 2023-12-26 MED ORDER — CLONIDINE HCL 0.3 MG/24HR TD PTWK: 0.3000 mg | MEDICATED_PATCH | TRANSDERMAL | Status: DC

## 2023-12-26 MED ORDER — OXYCODONE HCL 5 MG PO TABS
5.0000 mg | ORAL_TABLET | Freq: Four times a day (QID) | ORAL | Status: DC | PRN
  Administered 2023-12-30: 5 mg via ORAL
  Filled 2023-12-26: qty 1

## 2023-12-26 MED ORDER — HYDROMORPHONE HCL 1 MG/ML IJ SOLN
1.0000 mg | INTRAMUSCULAR | Status: DC | PRN
  Administered 2023-12-26 – 2023-12-28 (×4): 1 mg via INTRAVENOUS
  Filled 2023-12-26 (×4): qty 1

## 2023-12-26 MED ORDER — ACETAMINOPHEN 500 MG PO TABS
500.0000 mg | ORAL_TABLET | Freq: Four times a day (QID) | ORAL | Status: DC
  Administered 2023-12-26 – 2023-12-30 (×5): 500 mg via ORAL
  Filled 2023-12-26 (×9): qty 1

## 2023-12-26 NOTE — Progress Notes (Signed)
 Daily Progress Note Intern Pager: [phone]  Patient name: James James Medical record number: [id number] Date of birth: 11/13/1965 Age: 58 y.o. Gender: male  Primary Care Provider: Janna Ferrier, DO Consultants: PCCM, IR, Nephro, Vascular Surgery, Urology  Code Status: Full code   Pt Overview and Major Events to Date:  11/21: Admitted to ICU, IR embolization 11/22: Transferred to FMTS, transfuse 1 unit RBC   Medical Decision Making:  James James admitted for ruptured left renal neoplasm s/p IR embolization. Pertinent PMH/PSH includes ESRD on HD, COPD, hypertension, heart failure with reduced EF, prostate cancer, cirrhosis, and stroke.   Presented with abdominal pain to the ED. Hgb on admission 9.6 and down-trending to 6.1 requiring 1 unit transfusion with improvement to 7.1. CT AP significant for large left perinephric hematoma with extravasation and extensive retroperitoneal hemorrhage due to spontaneous rupture of left renal neoplasm. He underwent an IR embolization of the left renal artery on 11/21 and was admitted to the ICU for monitoring s/p procedure. Transferred to FMTS on 11/22.  Assessment & Plan Perinephric hematoma S/P L renal artery embolization 11/21. 1 unit RBC given 0300 11/22 - H&H q6h  - serial abdominal exams  - Pain: tylenol  500 q6h, oxycodone  5 mg q4h, dilaudid  1 mg q4h  - Claritin  10 mg PO for itching added  - Zofran  4 mg ODT for nausea with opiates, Qtc reviewed and appropriate  - IR following, appreciate recommendations  - repeat CT AP for acute drop in Hgb or worsening abdominal pain - transfuse <8 with heart history  HTN (hypertension) Soft BP overnight. Monitor pressure in setting of recent bleed. - continue Amlodipine  10 mg daily  - continue Metoprolol  50 mg BID  - continue Isosorbide  dinitrate 20 mg BID  - Hold clonidine  0.3 mg patch, monitor for rebound HTN  - Stop Hydralazine  50 mg BID  ESRD (end stage renal disease) on  dialysis James James) Dialysis MWF  - Nephrology following  - monitor pressures  Chronic health problem HFpEF: monitor volume status with transfusion  GERD: Protonix  40 mg daily  HLD: Crestor  10 mg daily  Secondary hyperparathyroidism: Sensipar  90 mg daily  COPD: home Stiolto Respimat , on formulary Brovana  neb BID and Incruse Ellipta    FEN/GI: CLD PPx: SCDs, high risk bleed  Dispo:Pending PT recommendations  pending clinical improvement . Barriers include ongoing need for lab monitoring s/p IR procedure.   Subjective:  Doing well. Continues to have burst of abdominal pain similar to the episode that brought him in. Denies bruising. Would like medication for itching.   Objective: Temp:  [97.4 F (36.3 C)-98.6 F (37 C)] 97.9 F (36.6 C) (11/22 0855) Pulse Rate:  [40-103] 49 (11/22 0855) Resp:  [11-20] 18 (11/22 0855) BP: (92-161)/(56-86) 112/73 (11/22 0855) SpO2:  [88 %-100 %] 98 % (11/22 0855) Weight:  [68.3 kg] 68.3 kg (11/22 0500) Physical Exam: General: chronically ill appearing, NAD Cardiovascular: RRR, no murmur on exam  Respiratory: clear, no increased WOB Abdomen: mildly distended, diffusely tender, bowel sounds present  Extremities: trace peripheral edema   Laboratory: Most recent CBC Lab Results  Component Value Date   WBC 5.9 12/26/2023   HGB 7.2 (L) 12/26/2023   HCT 22.2 (L) 12/26/2023   MCV 102.1 (H) 12/26/2023   PLT 136 (L) 12/26/2023   Most recent BMP    Latest Ref Rng & Units 12/26/2023    2:37 AM  BMP  Glucose 70 - 99 mg/dL 93   BUN 6 - 20  mg/dL 23   Creatinine 9.38 - 1.24 mg/dL 2.42   Sodium 864 - 854 mmol/L 137   Potassium 3.5 - 5.1 mmol/L 4.0   Chloride 98 - 111 mmol/L 95   CO2 22 - 32 mmol/L 29   Calcium  8.9 - 10.3 mg/dL 8.5    Imaging/Diagnostic Tests: No new labs or imaging.   James Perkins, DO 12/26/2023, 10:17 AM  PGY-3, James James FPTS Intern pager: [phone], text pages welcome Secure chat group James James

## 2023-12-26 NOTE — Progress Notes (Signed)
 Critical Hgb of 6.1 called to [facility] RN to be relayed to MD. Awaiting orders.

## 2023-12-26 NOTE — Assessment & Plan Note (Signed)
 Dialysis MWF  - Nephrology following  - monitor pressures

## 2023-12-26 NOTE — Plan of Care (Signed)
 FMTS Brief Progress Note  S: Patient states he is feeling fine.  States he feels bloated but is passing gas.  States pain is well-controlled.  No questions or concerns at this time.   O: BP (!) 151/88 (BP Location: Right Arm)   Pulse (!) 57   Temp 97.9 F (36.6 C) (Oral)   Resp 20   Ht 5' 5 (1.651 m)   Wt 68.3 kg   SpO2 95%   BMI 25.06 kg/m   General: Resting comfortably in bed, awake and conversant, no acute distress CV: RRR, normal S1/S2, no M/R/G Respiratory: CTAB, normal work of breathing on room air, no W/R/R Abdomen: Normoactive bowel sounds, soft, nontender, nondistended Neuro: No focal deficits Psych: Appropriate mood and affect  A/P: Perinephric hematoma -S/p 2 units PRBC, pending post transfusion H&H.  Transfusion threshold <8 given cardiac history -Pain well-controlled currently, continue pain regimen per day team - Orders reviewed. Labs for AM ordered, which was adjusted as needed.  - If condition changes, plan includes repeat CTAP if hemoglobin drops significantly.   Adele Song, MD 12/26/2023, 7:41 PM PGY-2,  Family Medicine Night Resident  Please page [phone] with questions.

## 2023-12-26 NOTE — Progress Notes (Signed)
 Patient ID: James Wall, male   DOB: Jul 27, 1965, 58 y.o.   MRN: [id number]    Referring Physician(s): Rosan Deward ORN, NP   Supervising Physician: Philip Cornet  Patient Status:  [facility] - In-pt  Chief Complaint:  Left perinephric hematoma; s/p left renal embolization 12/25/23  Subjective:  Pt still having waxing/waning abdominal pain primarily in left flank and left lateral abdomen.  Resting in bed otherwise, groin site looks good.  Denies N/V.  Hgb 7.0 today, receiving transfusion at bedside.  Allergies: Venofer   [ferric oxide] and Aspirin   Medications: Prior to Admission medications   Medication Sig Start Date End Date Taking? Authorizing Provider  albuterol  (VENTOLIN  HFA) 108 (90 Base) MCG/ACT inhaler Inhale 2 puffs into the lungs every 6 (six) hours as needed for wheezing or shortness of breath. 08/27/23  Yes Howell Lunger, DO  amLODipine  (NORVASC ) 10 MG tablet Take 1 tablet (10 mg total) by mouth at bedtime. 12/05/21  Yes Chandrasekhar, Mahesh A, MD  cinacalcet  (SENSIPAR ) 90 MG tablet Take 90 mg by mouth daily.   Yes [provider]  cloNIDine  (CATAPRES  - DOSED IN MG/24 HR) 0.3 mg/24hr patch Place 0.3 mg onto the skin once a week.   Yes [provider]  ferric citrate  (AURYXIA ) 1 GM 210 MG(Fe) tablet Take 630 mg by mouth 3 (three) times daily with meals. And 210mg  (1 tablet) up to three times a day with snacks.   Yes [provider]  hydrALAZINE  (APRESOLINE ) 50 MG tablet Take 1 tablet (50 mg total) by mouth 2 (two) times daily. Patient taking differently: Take 50 mg by mouth daily. 12/05/21  Yes Chandrasekhar, Mahesh A, MD  hydrOXYzine  (ATARAX ) 25 MG tablet Take 25 mg by mouth daily.   Yes [provider]  isosorbide  dinitrate (ISORDIL ) 20 MG tablet Take 1 tablet (20 mg total) by mouth 2 (two) times daily. 12/05/21  Yes Chandrasekhar, Mahesh A, MD  metoprolol  tartrate (LOPRESSOR ) 50 MG tablet Take 1 tablet (50 mg total) by mouth 2 (two)  times daily. 10/01/23 12/30/23 Yes West, Katlyn D, NP  pantoprazole  (PROTONIX ) 20 MG tablet TAKE 1 TABLET(20 MG) BY MOUTH DAILY Patient taking differently: Take 40 mg by mouth daily. 01/09/22  Yes [name], DO  rosuvastatin  (CRESTOR ) 10 MG tablet Take 1 tablet (10 mg total) by mouth daily. 12/24/23  Yes Janna Ferrier, DO  CINACALCET  HCL PO Take 120 mg by mouth every Monday, Wednesday, and Friday with hemodialysis. Provided and administered by dialysis clinic on Monday, Wednesday, Friday.    [provider]  Doxercalciferol  (HECTOROL  IV) Inject 3 mcg into the vein every Monday, Wednesday, and Friday with hemodialysis. Provided and administered by dialysis clinic on Monday, Wednesday, Friday.    [provider]  latanoprost  (XALATAN ) 0.005 % ophthalmic solution Place 1 drop into both eyes at bedtime. Patient not taking: Reported on 12/26/2023    [provider]  Methoxy PEG-Epoetin  Beta (MIRCERA IJ) Inject 150 mcg into the vein every 14 (fourteen) days. Provided and administered by dialysis clinic every 14 days.    [provider]  sevelamer  carbonate (RENVELA ) 800 MG tablet Take 3,200 mg by mouth 3 (three) times daily with meals. Patient not taking: Reported on 12/26/2023    [provider]  Tiotropium Bromide-Olodaterol (STIOLTO RESPIMAT ) 2.5-2.5 MCG/ACT AERS Inhale 2 puffs into the lungs daily. Patient not taking: Reported on 12/26/2023 08/27/23   Howell Lunger, DO     Vital Signs: BP 118/61   Pulse (!) 50   Temp ROLLEN)  97 F (36.1 C) (Axillary)   Resp 15   Ht 5' 5 (1.651 m)   Wt 150 lb 9.2 oz (68.3 kg)   SpO2 97%   BMI 25.06 kg/m   Physical Exam Vitals and nursing note reviewed.  Constitutional:      General: He is not in acute distress. Cardiovascular:     Rate and Rhythm: Bradycardia present.  Pulmonary:     Effort: Pulmonary effort is normal.  Abdominal:     Tenderness: There is abdominal tenderness (L flank, L lateral  abdomen).  Skin:    General: Skin is warm and dry.  Neurological:     Mental Status: He is alert and oriented to person, place, and time. Mental status is at baseline.     Imaging: DG Chest 1 View Result Date: 12/25/2023 EXAM: 1 VIEW(S) XRAY OF THE CHEST 12/25/2023 08:28:00 AM COMPARISON: 08/20/2023 CLINICAL HISTORY: Central line complication FINDINGS: LINES, TUBES AND DEVICES: Right IJ hemodialysis catheter terminates at superior cavoatrial junction. LUNGS AND PLEURA: Presumed artifact overlying the right lower lobe with metallic density in the projection of the medial aspect of the right lung base, new from previous exam. Mild pulmonary edema. New small pleural effusion with worsening aeration to the left lower lung possibly representing atelectasis or airspace disease. Underlying aspiration not excluded. No pneumothorax. HEART AND MEDIASTINUM: Stable cardiomegaly. Aortic atherosclerosis. BONES AND SOFT TISSUES: No acute osseous abnormality. IMPRESSION: 1. New small left pleural effusion with worsening left basilar aeration, which may reflect atelectasis or airspace disease; aspiration remains a consideration. 2. Mild pulmonary edema. 3. Artifact overlying the right lower lobe , new from previous exam. Electronically signed by: Waddell Calk MD 12/25/2023 01:00 PM EST RP Workstation: [id number]   IR EMBO ART  VEN HEMORR LYMPH EXTRAV  INC GUIDE ROADMAPPING Result Date: 12/25/2023 INDICATION: Spontaneous large left renal hemorrhage with prominent active extravasation of contrast from the peripheral left kidney by CT at the level of a known hyperdense renal lesion. The patient presents for arteriography with planned embolization the active renal hemorrhage. EXAM: 1. ULTRASOUND GUIDANCE FOR VASCULAR ACCESS OF THE RIGHT COMMON FEMORAL ARTERY 2. SELECTIVE ARTERIOGRAPHY OF THE LEFT RENAL ARTERY 3. ADDITIONAL SELECTIVE ARTERIOGRAPHY OF UPPER POLE THIRD ORDER SELECTIVE BRANCH VESSEL OF LEFT RENAL ARTERY 4.  ADDITIONAL SELECTIVE ARTERIOGRAPHY OF DISTAL LEFT RENAL ARTERY 5. ADDITIONAL SELECTIVE ARTERIOGRAPHY OF ADDITIONAL THIRD ORDER SEGMENTAL LEFT RENAL ARTERY BRANCH 6. TRANSCATHETER EMBOLIZATION OF LEFT RENAL ARTERY TO TREAT ARTERIAL HEMORRHAGE MEDICATIONS: None ANESTHESIA/SEDATION: Moderate (conscious) sedation was employed during this procedure. A total of Versed  2.5 mg and Fentanyl  125 mcg was administered intravenously. Moderate Sedation Time: 50 minutes. The patient's level of consciousness and vital signs were monitored continuously by radiology nursing throughout the procedure under my direct supervision. CONTRAST:  60 mL Omnipaque  300 FLUOROSCOPY TIME:  Radiation Exposure Index (as provided by the fluoroscopic device): 1,024 mGy Kerma COMPLICATIONS: None immediate. PROCEDURE: Informed consent was obtained from the patient's sister following explanation of the procedure, risks, benefits and alternatives. The patient could not consent due to relative level of sedation at the time of assessment due to administration pain medication. The patient's sister understands, agrees and consents for the procedure. All questions were addressed. A time out was performed prior to the initiation of the procedure. Maximal barrier sterile technique utilized including caps, mask, sterile gowns, sterile gloves, large sterile drape, hand hygiene, and chlorhexidine  prep. Ultrasound was used to confirm patency of the right common femoral artery. An ultrasound image was saved and  recorded. Under direct ultrasound guidance, access of right common femoral artery was performed with a micropuncture set. A 5 French sheath was placed over a guidewire. A 5 French Cobra catheter was advanced into the abdominal aorta and used to selectively catheterize the origin of the left renal artery. Selective left renal arteriography was performed. Attempt was made to advance the 5 French catheter into the main left renal artery. A coaxial 2.4 French  microcatheter was advanced through the 5 French catheter into a third order left renal artery branch and selective renal arteriography performed. The microcatheter was withdrawn slightly and advanced into the distal aspect of the main left renal artery. Additional selective arteriography was performed. The microcatheter was then used to selectively catheterize a superior segmental branch of the left renal artery and additional selective arteriography performed. Embolization was begun within the superior segmental branch through the microcatheter with deployment of 0.018 inch embolization coils. During embolization, periodic injection of contrast and arteriography was performed through the microcatheter. Additional coil embolization was carried out proximally into the distal aspect of the main left renal artery. Completion arteriography was performed through the microcatheter. Hemostasis was obtained at the right femoral access site with the Angio-Seal device after sheath removal. FINDINGS: Left renal arteriography demonstrates irregularity of the proximal left renal artery due to atherosclerosis without significant stenosis. There is active contrast extravasation from a mid to lower peripheral branch vessel near the renal capsule with pooling of extravasated contrast. This extravasation is very focal with no definable true tumor vascularity identified by angiography. The 5 French catheter could not be advanced into the main renal artery due to irregular atherosclerotic plaque. A microcatheter was able to be advanced initially into a subsegmental branch vessel which did not supply the region of active contrast extravasation. The microcatheter was withdrawn and additional arteriography demonstrates the region of contrast extravasation supplied mainly by a branch of a superior and likely anterior segmental artery. This artery was able to be catheterized. The microcatheter could not be advanced all the way to the level  of peripheral extravasation and coil embolization was begun in the superior segmental artery. Embolization was continued back into the distal aspect of the main left renal artery in order to achieve adequate hemostasis. Completion arteriography demonstrates no further contrast extravasation. There is some minimal preserved flow to predominantly the lower pole of the left kidney. IMPRESSION: 1. Left renal arteriography demonstrates active contrast extravasation from a focal peripheral mid to lower pole renal artery branch. This is not associated with clear tumor vascularity and most likely etiology of active bleeding is felt to be rupture of a hemorrhagic cyst with associated arterial bleeding. In order to exclude an underlying tumor, recommend follow-up MRI with and without contrast at a later time once the perinephric hematoma has resolved. 2. Successful embolization of active bleeding from the left renal artery with embolization performed at the level of a superior segmental branch of the left renal artery extending into the distal main left renal artery. No further contrast extravasation was identified after coil embolization. Electronically Signed   By: Marcey Moan M.D.   On: 12/25/2023 09:10   IR US  Guide Vasc Access Right Result Date: 12/25/2023 INDICATION: Spontaneous large left renal hemorrhage with prominent active extravasation of contrast from the peripheral left kidney by CT at the level of a known hyperdense renal lesion. The patient presents for arteriography with planned embolization the active renal hemorrhage. EXAM: 1. ULTRASOUND GUIDANCE FOR VASCULAR ACCESS OF THE RIGHT COMMON FEMORAL  ARTERY 2. SELECTIVE ARTERIOGRAPHY OF THE LEFT RENAL ARTERY 3. ADDITIONAL SELECTIVE ARTERIOGRAPHY OF UPPER POLE THIRD ORDER SELECTIVE BRANCH VESSEL OF LEFT RENAL ARTERY 4. ADDITIONAL SELECTIVE ARTERIOGRAPHY OF DISTAL LEFT RENAL ARTERY 5. ADDITIONAL SELECTIVE ARTERIOGRAPHY OF ADDITIONAL THIRD ORDER SEGMENTAL  LEFT RENAL ARTERY BRANCH 6. TRANSCATHETER EMBOLIZATION OF LEFT RENAL ARTERY TO TREAT ARTERIAL HEMORRHAGE MEDICATIONS: None ANESTHESIA/SEDATION: Moderate (conscious) sedation was employed during this procedure. A total of Versed  2.5 mg and Fentanyl  125 mcg was administered intravenously. Moderate Sedation Time: 50 minutes. The patient's level of consciousness and vital signs were monitored continuously by radiology nursing throughout the procedure under my direct supervision. CONTRAST:  60 mL Omnipaque  300 FLUOROSCOPY TIME:  Radiation Exposure Index (as provided by the fluoroscopic device): 1,024 mGy Kerma COMPLICATIONS: None immediate. PROCEDURE: Informed consent was obtained from the patient's sister following explanation of the procedure, risks, benefits and alternatives. The patient could not consent due to relative level of sedation at the time of assessment due to administration pain medication. The patient's sister understands, agrees and consents for the procedure. All questions were addressed. A time out was performed prior to the initiation of the procedure. Maximal barrier sterile technique utilized including caps, mask, sterile gowns, sterile gloves, large sterile drape, hand hygiene, and chlorhexidine  prep. Ultrasound was used to confirm patency of the right common femoral artery. An ultrasound image was saved and recorded. Under direct ultrasound guidance, access of right common femoral artery was performed with a micropuncture set. A 5 French sheath was placed over a guidewire. A 5 French Cobra catheter was advanced into the abdominal aorta and used to selectively catheterize the origin of the left renal artery. Selective left renal arteriography was performed. Attempt was made to advance the 5 French catheter into the main left renal artery. A coaxial 2.4 French microcatheter was advanced through the 5 French catheter into a third order left renal artery branch and selective renal arteriography  performed. The microcatheter was withdrawn slightly and advanced into the distal aspect of the main left renal artery. Additional selective arteriography was performed. The microcatheter was then used to selectively catheterize a superior segmental branch of the left renal artery and additional selective arteriography performed. Embolization was begun within the superior segmental branch through the microcatheter with deployment of 0.018 inch embolization coils. During embolization, periodic injection of contrast and arteriography was performed through the microcatheter. Additional coil embolization was carried out proximally into the distal aspect of the main left renal artery. Completion arteriography was performed through the microcatheter. Hemostasis was obtained at the right femoral access site with the Angio-Seal device after sheath removal. FINDINGS: Left renal arteriography demonstrates irregularity of the proximal left renal artery due to atherosclerosis without significant stenosis. There is active contrast extravasation from a mid to lower peripheral branch vessel near the renal capsule with pooling of extravasated contrast. This extravasation is very focal with no definable true tumor vascularity identified by angiography. The 5 French catheter could not be advanced into the main renal artery due to irregular atherosclerotic plaque. A microcatheter was able to be advanced initially into a subsegmental branch vessel which did not supply the region of active contrast extravasation. The microcatheter was withdrawn and additional arteriography demonstrates the region of contrast extravasation supplied mainly by a branch of a superior and likely anterior segmental artery. This artery was able to be catheterized. The microcatheter could not be advanced all the way to the level of peripheral extravasation and coil embolization was begun in the superior segmental artery.  Embolization was continued back into the  distal aspect of the main left renal artery in order to achieve adequate hemostasis. Completion arteriography demonstrates no further contrast extravasation. There is some minimal preserved flow to predominantly the lower pole of the left kidney. IMPRESSION: 1. Left renal arteriography demonstrates active contrast extravasation from a focal peripheral mid to lower pole renal artery branch. This is not associated with clear tumor vascularity and most likely etiology of active bleeding is felt to be rupture of a hemorrhagic cyst with associated arterial bleeding. In order to exclude an underlying tumor, recommend follow-up MRI with and without contrast at a later time once the perinephric hematoma has resolved. 2. Successful embolization of active bleeding from the left renal artery with embolization performed at the level of a superior segmental branch of the left renal artery extending into the distal main left renal artery. No further contrast extravasation was identified after coil embolization. Electronically Signed   By: Marcey Moan M.D.   On: 12/25/2023 09:10   IR Angiogram Renal Left Selective Result Date: 12/25/2023 INDICATION: Spontaneous large left renal hemorrhage with prominent active extravasation of contrast from the peripheral left kidney by CT at the level of a known hyperdense renal lesion. The patient presents for arteriography with planned embolization the active renal hemorrhage. EXAM: 1. ULTRASOUND GUIDANCE FOR VASCULAR ACCESS OF THE RIGHT COMMON FEMORAL ARTERY 2. SELECTIVE ARTERIOGRAPHY OF THE LEFT RENAL ARTERY 3. ADDITIONAL SELECTIVE ARTERIOGRAPHY OF UPPER POLE THIRD ORDER SELECTIVE BRANCH VESSEL OF LEFT RENAL ARTERY 4. ADDITIONAL SELECTIVE ARTERIOGRAPHY OF DISTAL LEFT RENAL ARTERY 5. ADDITIONAL SELECTIVE ARTERIOGRAPHY OF ADDITIONAL THIRD ORDER SEGMENTAL LEFT RENAL ARTERY BRANCH 6. TRANSCATHETER EMBOLIZATION OF LEFT RENAL ARTERY TO TREAT ARTERIAL HEMORRHAGE MEDICATIONS: None  ANESTHESIA/SEDATION: Moderate (conscious) sedation was employed during this procedure. A total of Versed  2.5 mg and Fentanyl  125 mcg was administered intravenously. Moderate Sedation Time: 50 minutes. The patient's level of consciousness and vital signs were monitored continuously by radiology nursing throughout the procedure under my direct supervision. CONTRAST:  60 mL Omnipaque  300 FLUOROSCOPY TIME:  Radiation Exposure Index (as provided by the fluoroscopic device): 1,024 mGy Kerma COMPLICATIONS: None immediate. PROCEDURE: Informed consent was obtained from the patient's sister following explanation of the procedure, risks, benefits and alternatives. The patient could not consent due to relative level of sedation at the time of assessment due to administration pain medication. The patient's sister understands, agrees and consents for the procedure. All questions were addressed. A time out was performed prior to the initiation of the procedure. Maximal barrier sterile technique utilized including caps, mask, sterile gowns, sterile gloves, large sterile drape, hand hygiene, and chlorhexidine  prep. Ultrasound was used to confirm patency of the right common femoral artery. An ultrasound image was saved and recorded. Under direct ultrasound guidance, access of right common femoral artery was performed with a micropuncture set. A 5 French sheath was placed over a guidewire. A 5 French Cobra catheter was advanced into the abdominal aorta and used to selectively catheterize the origin of the left renal artery. Selective left renal arteriography was performed. Attempt was made to advance the 5 French catheter into the main left renal artery. A coaxial 2.4 French microcatheter was advanced through the 5 French catheter into a third order left renal artery branch and selective renal arteriography performed. The microcatheter was withdrawn slightly and advanced into the distal aspect of the main left renal artery. Additional  selective arteriography was performed. The microcatheter was then used to selectively catheterize a superior segmental  branch of the left renal artery and additional selective arteriography performed. Embolization was begun within the superior segmental branch through the microcatheter with deployment of 0.018 inch embolization coils. During embolization, periodic injection of contrast and arteriography was performed through the microcatheter. Additional coil embolization was carried out proximally into the distal aspect of the main left renal artery. Completion arteriography was performed through the microcatheter. Hemostasis was obtained at the right femoral access site with the Angio-Seal device after sheath removal. FINDINGS: Left renal arteriography demonstrates irregularity of the proximal left renal artery due to atherosclerosis without significant stenosis. There is active contrast extravasation from a mid to lower peripheral branch vessel near the renal capsule with pooling of extravasated contrast. This extravasation is very focal with no definable true tumor vascularity identified by angiography. The 5 French catheter could not be advanced into the main renal artery due to irregular atherosclerotic plaque. A microcatheter was able to be advanced initially into a subsegmental branch vessel which did not supply the region of active contrast extravasation. The microcatheter was withdrawn and additional arteriography demonstrates the region of contrast extravasation supplied mainly by a branch of a superior and likely anterior segmental artery. This artery was able to be catheterized. The microcatheter could not be advanced all the way to the level of peripheral extravasation and coil embolization was begun in the superior segmental artery. Embolization was continued back into the distal aspect of the main left renal artery in order to achieve adequate hemostasis. Completion arteriography demonstrates no  further contrast extravasation. There is some minimal preserved flow to predominantly the lower pole of the left kidney. IMPRESSION: 1. Left renal arteriography demonstrates active contrast extravasation from a focal peripheral mid to lower pole renal artery branch. This is not associated with clear tumor vascularity and most likely etiology of active bleeding is felt to be rupture of a hemorrhagic cyst with associated arterial bleeding. In order to exclude an underlying tumor, recommend follow-up MRI with and without contrast at a later time once the perinephric hematoma has resolved. 2. Successful embolization of active bleeding from the left renal artery with embolization performed at the level of a superior segmental branch of the left renal artery extending into the distal main left renal artery. No further contrast extravasation was identified after coil embolization. Electronically Signed   By: Marcey Moan M.D.   On: 12/25/2023 09:10   IR Angiogram Selective Each Additional Vessel Result Date: 12/25/2023 INDICATION: Spontaneous large left renal hemorrhage with prominent active extravasation of contrast from the peripheral left kidney by CT at the level of a known hyperdense renal lesion. The patient presents for arteriography with planned embolization the active renal hemorrhage. EXAM: 1. ULTRASOUND GUIDANCE FOR VASCULAR ACCESS OF THE RIGHT COMMON FEMORAL ARTERY 2. SELECTIVE ARTERIOGRAPHY OF THE LEFT RENAL ARTERY 3. ADDITIONAL SELECTIVE ARTERIOGRAPHY OF UPPER POLE THIRD ORDER SELECTIVE BRANCH VESSEL OF LEFT RENAL ARTERY 4. ADDITIONAL SELECTIVE ARTERIOGRAPHY OF DISTAL LEFT RENAL ARTERY 5. ADDITIONAL SELECTIVE ARTERIOGRAPHY OF ADDITIONAL THIRD ORDER SEGMENTAL LEFT RENAL ARTERY BRANCH 6. TRANSCATHETER EMBOLIZATION OF LEFT RENAL ARTERY TO TREAT ARTERIAL HEMORRHAGE MEDICATIONS: None ANESTHESIA/SEDATION: Moderate (conscious) sedation was employed during this procedure. A total of Versed  2.5 mg and Fentanyl   125 mcg was administered intravenously. Moderate Sedation Time: 50 minutes. The patient's level of consciousness and vital signs were monitored continuously by radiology nursing throughout the procedure under my direct supervision. CONTRAST:  60 mL Omnipaque  300 FLUOROSCOPY TIME:  Radiation Exposure Index (as provided by the fluoroscopic device): 1,024 mGy  Kerma COMPLICATIONS: None immediate. PROCEDURE: Informed consent was obtained from the patient's sister following explanation of the procedure, risks, benefits and alternatives. The patient could not consent due to relative level of sedation at the time of assessment due to administration pain medication. The patient's sister understands, agrees and consents for the procedure. All questions were addressed. A time out was performed prior to the initiation of the procedure. Maximal barrier sterile technique utilized including caps, mask, sterile gowns, sterile gloves, large sterile drape, hand hygiene, and chlorhexidine  prep. Ultrasound was used to confirm patency of the right common femoral artery. An ultrasound image was saved and recorded. Under direct ultrasound guidance, access of right common femoral artery was performed with a micropuncture set. A 5 French sheath was placed over a guidewire. A 5 French Cobra catheter was advanced into the abdominal aorta and used to selectively catheterize the origin of the left renal artery. Selective left renal arteriography was performed. Attempt was made to advance the 5 French catheter into the main left renal artery. A coaxial 2.4 French microcatheter was advanced through the 5 French catheter into a third order left renal artery branch and selective renal arteriography performed. The microcatheter was withdrawn slightly and advanced into the distal aspect of the main left renal artery. Additional selective arteriography was performed. The microcatheter was then used to selectively catheterize a superior segmental  branch of the left renal artery and additional selective arteriography performed. Embolization was begun within the superior segmental branch through the microcatheter with deployment of 0.018 inch embolization coils. During embolization, periodic injection of contrast and arteriography was performed through the microcatheter. Additional coil embolization was carried out proximally into the distal aspect of the main left renal artery. Completion arteriography was performed through the microcatheter. Hemostasis was obtained at the right femoral access site with the Angio-Seal device after sheath removal. FINDINGS: Left renal arteriography demonstrates irregularity of the proximal left renal artery due to atherosclerosis without significant stenosis. There is active contrast extravasation from a mid to lower peripheral branch vessel near the renal capsule with pooling of extravasated contrast. This extravasation is very focal with no definable true tumor vascularity identified by angiography. The 5 French catheter could not be advanced into the main renal artery due to irregular atherosclerotic plaque. A microcatheter was able to be advanced initially into a subsegmental branch vessel which did not supply the region of active contrast extravasation. The microcatheter was withdrawn and additional arteriography demonstrates the region of contrast extravasation supplied mainly by a branch of a superior and likely anterior segmental artery. This artery was able to be catheterized. The microcatheter could not be advanced all the way to the level of peripheral extravasation and coil embolization was begun in the superior segmental artery. Embolization was continued back into the distal aspect of the main left renal artery in order to achieve adequate hemostasis. Completion arteriography demonstrates no further contrast extravasation. There is some minimal preserved flow to predominantly the lower pole of the left kidney.  IMPRESSION: 1. Left renal arteriography demonstrates active contrast extravasation from a focal peripheral mid to lower pole renal artery branch. This is not associated with clear tumor vascularity and most likely etiology of active bleeding is felt to be rupture of a hemorrhagic cyst with associated arterial bleeding. In order to exclude an underlying tumor, recommend follow-up MRI with and without contrast at a later time once the perinephric hematoma has resolved. 2. Successful embolization of active bleeding from the left renal artery with embolization performed  at the level of a superior segmental branch of the left renal artery extending into the distal main left renal artery. No further contrast extravasation was identified after coil embolization. Electronically Signed   By: Marcey Moan M.D.   On: 12/25/2023 09:10   IR Angiogram Selective Each Additional Vessel Result Date: 12/25/2023 INDICATION: Spontaneous large left renal hemorrhage with prominent active extravasation of contrast from the peripheral left kidney by CT at the level of a known hyperdense renal lesion. The patient presents for arteriography with planned embolization the active renal hemorrhage. EXAM: 1. ULTRASOUND GUIDANCE FOR VASCULAR ACCESS OF THE RIGHT COMMON FEMORAL ARTERY 2. SELECTIVE ARTERIOGRAPHY OF THE LEFT RENAL ARTERY 3. ADDITIONAL SELECTIVE ARTERIOGRAPHY OF UPPER POLE THIRD ORDER SELECTIVE BRANCH VESSEL OF LEFT RENAL ARTERY 4. ADDITIONAL SELECTIVE ARTERIOGRAPHY OF DISTAL LEFT RENAL ARTERY 5. ADDITIONAL SELECTIVE ARTERIOGRAPHY OF ADDITIONAL THIRD ORDER SEGMENTAL LEFT RENAL ARTERY BRANCH 6. TRANSCATHETER EMBOLIZATION OF LEFT RENAL ARTERY TO TREAT ARTERIAL HEMORRHAGE MEDICATIONS: None ANESTHESIA/SEDATION: Moderate (conscious) sedation was employed during this procedure. A total of Versed  2.5 mg and Fentanyl  125 mcg was administered intravenously. Moderate Sedation Time: 50 minutes. The patient's level of consciousness and  vital signs were monitored continuously by radiology nursing throughout the procedure under my direct supervision. CONTRAST:  60 mL Omnipaque  300 FLUOROSCOPY TIME:  Radiation Exposure Index (as provided by the fluoroscopic device): 1,024 mGy Kerma COMPLICATIONS: None immediate. PROCEDURE: Informed consent was obtained from the patient's sister following explanation of the procedure, risks, benefits and alternatives. The patient could not consent due to relative level of sedation at the time of assessment due to administration pain medication. The patient's sister understands, agrees and consents for the procedure. All questions were addressed. A time out was performed prior to the initiation of the procedure. Maximal barrier sterile technique utilized including caps, mask, sterile gowns, sterile gloves, large sterile drape, hand hygiene, and chlorhexidine  prep. Ultrasound was used to confirm patency of the right common femoral artery. An ultrasound image was saved and recorded. Under direct ultrasound guidance, access of right common femoral artery was performed with a micropuncture set. A 5 French sheath was placed over a guidewire. A 5 French Cobra catheter was advanced into the abdominal aorta and used to selectively catheterize the origin of the left renal artery. Selective left renal arteriography was performed. Attempt was made to advance the 5 French catheter into the main left renal artery. A coaxial 2.4 French microcatheter was advanced through the 5 French catheter into a third order left renal artery branch and selective renal arteriography performed. The microcatheter was withdrawn slightly and advanced into the distal aspect of the main left renal artery. Additional selective arteriography was performed. The microcatheter was then used to selectively catheterize a superior segmental branch of the left renal artery and additional selective arteriography performed. Embolization was begun within the  superior segmental branch through the microcatheter with deployment of 0.018 inch embolization coils. During embolization, periodic injection of contrast and arteriography was performed through the microcatheter. Additional coil embolization was carried out proximally into the distal aspect of the main left renal artery. Completion arteriography was performed through the microcatheter. Hemostasis was obtained at the right femoral access site with the Angio-Seal device after sheath removal. FINDINGS: Left renal arteriography demonstrates irregularity of the proximal left renal artery due to atherosclerosis without significant stenosis. There is active contrast extravasation from a mid to lower peripheral branch vessel near the renal capsule with pooling of extravasated contrast. This extravasation is very focal with  no definable true tumor vascularity identified by angiography. The 5 French catheter could not be advanced into the main renal artery due to irregular atherosclerotic plaque. A microcatheter was able to be advanced initially into a subsegmental branch vessel which did not supply the region of active contrast extravasation. The microcatheter was withdrawn and additional arteriography demonstrates the region of contrast extravasation supplied mainly by a branch of a superior and likely anterior segmental artery. This artery was able to be catheterized. The microcatheter could not be advanced all the way to the level of peripheral extravasation and coil embolization was begun in the superior segmental artery. Embolization was continued back into the distal aspect of the main left renal artery in order to achieve adequate hemostasis. Completion arteriography demonstrates no further contrast extravasation. There is some minimal preserved flow to predominantly the lower pole of the left kidney. IMPRESSION: 1. Left renal arteriography demonstrates active contrast extravasation from a focal peripheral mid to  lower pole renal artery branch. This is not associated with clear tumor vascularity and most likely etiology of active bleeding is felt to be rupture of a hemorrhagic cyst with associated arterial bleeding. In order to exclude an underlying tumor, recommend follow-up MRI with and without contrast at a later time once the perinephric hematoma has resolved. 2. Successful embolization of active bleeding from the left renal artery with embolization performed at the level of a superior segmental branch of the left renal artery extending into the distal main left renal artery. No further contrast extravasation was identified after coil embolization. Electronically Signed   By: Marcey Moan M.D.   On: 12/25/2023 09:10   IR Angiogram Selective Each Additional Vessel Result Date: 12/25/2023 INDICATION: Spontaneous large left renal hemorrhage with prominent active extravasation of contrast from the peripheral left kidney by CT at the level of a known hyperdense renal lesion. The patient presents for arteriography with planned embolization the active renal hemorrhage. EXAM: 1. ULTRASOUND GUIDANCE FOR VASCULAR ACCESS OF THE RIGHT COMMON FEMORAL ARTERY 2. SELECTIVE ARTERIOGRAPHY OF THE LEFT RENAL ARTERY 3. ADDITIONAL SELECTIVE ARTERIOGRAPHY OF UPPER POLE THIRD ORDER SELECTIVE BRANCH VESSEL OF LEFT RENAL ARTERY 4. ADDITIONAL SELECTIVE ARTERIOGRAPHY OF DISTAL LEFT RENAL ARTERY 5. ADDITIONAL SELECTIVE ARTERIOGRAPHY OF ADDITIONAL THIRD ORDER SEGMENTAL LEFT RENAL ARTERY BRANCH 6. TRANSCATHETER EMBOLIZATION OF LEFT RENAL ARTERY TO TREAT ARTERIAL HEMORRHAGE MEDICATIONS: None ANESTHESIA/SEDATION: Moderate (conscious) sedation was employed during this procedure. A total of Versed  2.5 mg and Fentanyl  125 mcg was administered intravenously. Moderate Sedation Time: 50 minutes. The patient's level of consciousness and vital signs were monitored continuously by radiology nursing throughout the procedure under my direct supervision.  CONTRAST:  60 mL Omnipaque  300 FLUOROSCOPY TIME:  Radiation Exposure Index (as provided by the fluoroscopic device): 1,024 mGy Kerma COMPLICATIONS: None immediate. PROCEDURE: Informed consent was obtained from the patient's sister following explanation of the procedure, risks, benefits and alternatives. The patient could not consent due to relative level of sedation at the time of assessment due to administration pain medication. The patient's sister understands, agrees and consents for the procedure. All questions were addressed. A time out was performed prior to the initiation of the procedure. Maximal barrier sterile technique utilized including caps, mask, sterile gowns, sterile gloves, large sterile drape, hand hygiene, and chlorhexidine  prep. Ultrasound was used to confirm patency of the right common femoral artery. An ultrasound image was saved and recorded. Under direct ultrasound guidance, access of right common femoral artery was performed with a micropuncture set. A 5 French sheath  was placed over a guidewire. A 5 French Cobra catheter was advanced into the abdominal aorta and used to selectively catheterize the origin of the left renal artery. Selective left renal arteriography was performed. Attempt was made to advance the 5 French catheter into the main left renal artery. A coaxial 2.4 French microcatheter was advanced through the 5 French catheter into a third order left renal artery branch and selective renal arteriography performed. The microcatheter was withdrawn slightly and advanced into the distal aspect of the main left renal artery. Additional selective arteriography was performed. The microcatheter was then used to selectively catheterize a superior segmental branch of the left renal artery and additional selective arteriography performed. Embolization was begun within the superior segmental branch through the microcatheter with deployment of 0.018 inch embolization coils. During  embolization, periodic injection of contrast and arteriography was performed through the microcatheter. Additional coil embolization was carried out proximally into the distal aspect of the main left renal artery. Completion arteriography was performed through the microcatheter. Hemostasis was obtained at the right femoral access site with the Angio-Seal device after sheath removal. FINDINGS: Left renal arteriography demonstrates irregularity of the proximal left renal artery due to atherosclerosis without significant stenosis. There is active contrast extravasation from a mid to lower peripheral branch vessel near the renal capsule with pooling of extravasated contrast. This extravasation is very focal with no definable true tumor vascularity identified by angiography. The 5 French catheter could not be advanced into the main renal artery due to irregular atherosclerotic plaque. A microcatheter was able to be advanced initially into a subsegmental branch vessel which did not supply the region of active contrast extravasation. The microcatheter was withdrawn and additional arteriography demonstrates the region of contrast extravasation supplied mainly by a branch of a superior and likely anterior segmental artery. This artery was able to be catheterized. The microcatheter could not be advanced all the way to the level of peripheral extravasation and coil embolization was begun in the superior segmental artery. Embolization was continued back into the distal aspect of the main left renal artery in order to achieve adequate hemostasis. Completion arteriography demonstrates no further contrast extravasation. There is some minimal preserved flow to predominantly the lower pole of the left kidney. IMPRESSION: 1. Left renal arteriography demonstrates active contrast extravasation from a focal peripheral mid to lower pole renal artery branch. This is not associated with clear tumor vascularity and most likely etiology of  active bleeding is felt to be rupture of a hemorrhagic cyst with associated arterial bleeding. In order to exclude an underlying tumor, recommend follow-up MRI with and without contrast at a later time once the perinephric hematoma has resolved. 2. Successful embolization of active bleeding from the left renal artery with embolization performed at the level of a superior segmental branch of the left renal artery extending into the distal main left renal artery. No further contrast extravasation was identified after coil embolization. Electronically Signed   By: Marcey Moan M.D.   On: 12/25/2023 09:10   CT ABDOMEN PELVIS W CONTRAST Result Date: 12/25/2023 EXAM: CT ABDOMEN AND PELVIS WITH CONTRAST 12/25/2023 03:44:33 AM TECHNIQUE: CT of the abdomen and pelvis was performed with the administration of 100 mL of iohexol  (OMNIPAQUE ) 350 MG/ML injection. Multiplanar reformatted images are provided for review. Automated exposure control, iterative reconstruction, and/or weight-based adjustment of the mA/kV was utilized to reduce the radiation dose to as low as reasonably achievable. COMPARISON: Prior examination of 12/05/2023 and 10/19/2022. CLINICAL HISTORY: Abdominal pain, acute,  nonlocalized. FINDINGS: LOWER CHEST: Extensive multivessel coronary artery calcifications. Mild cardiomegaly. Small hiatal hernia. HEPATOBILIARY: The liver is unremarkable. Gallbladder is unremarkable. No biliary ductal dilatation. SPLEEN: No acute abnormality. PANCREAS: No acute abnormality. ADRENAL GLANDS: No acute abnormality. KIDNEYS, URETERS AND BLADDER: The kidneys are markedly atrophic, similar to prior examination, in keeping with changes of end-stage renal disease. Innumerable simple cysts are seen within the kidneys bilaterally in keeping with probable cystic change of chronic renal failure. However, since the prior examination there has developed a large left perinephric hematoma with extensive hemorrhage within and extending  into the retroperitoneum. 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 There is trace high attenuation material within the decompressed bladder lumen in keeping with a small amount of hemorrhagic fluid or debris. GI AND BOWEL: Large retroperitoneal/perinephric hematoma displaces the bowel from the left hemiabdomen. The stomach, small bowel, and large bowel are otherwise unremarkable. Appendix normal. PERITONEUM AND RETROPERITONEUM: Fluid is also seen tracking around the pancreas and duodenum superiorly. There is mild bland ascites, however, no evidence of hemoperitoneum. VASCULATURE: Extensive aortoiliac atherosclerotic calcifications. No aortic aneurysm. LYMPH NODES: No lymphadenopathy. REPRODUCTIVE ORGANS: Fiducial markers are seen within the prostate gland. BONES AND SOFT TISSUES: The osseous structures are diffusely sclerotic in keeping with changes of renal osteodystrophy. No acute bone abnormality. No lytic or blastic bone lesion. Moderate diffuse subcutaneous edema in keeping with anasarca. IMPRESSION: 1. 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 Associated fluid tracks around the pancreas and duodenum. 2. Mild bland ascites. No hemoperitoneum. 3. Moderate diffuse subcutaneous edema consistent with anasarca. 4. Markedly atrophic kidneys with innumerable simple cysts, compatible with end-stage renal disease; unchanged from prior. 5. Extensive multivessel coronary artery calcifications and mild cardiomegaly. 6. Small hiatal hernia. I discussed these findings personally with Dr. Midge at 4:21 am Electronically signed by: Dorethia Molt MD 12/25/2023 04:26 AM EST RP Workstation: [id number]    Labs:  CBC: Recent  Labs    12/25/23 0147 12/25/23 0152 12/25/23 0837 12/25/23 1353 12/26/23 0237 12/26/23 0652 12/26/23 1233  WBC 6.7  --  6.5 6.6 5.9  --   --   HGB 9.6*   < > 7.4* 7.5* 6.1* 7.2* 7.0*  HCT 30.2*   < > 23.1* 23.4* 19.4* 22.2* 21.2*  PLT 176  --  149* 133* 136*  --   --    < > = values in this interval not displayed.    COAGS: Recent Labs    12/25/23 0509  INR 1.3*    BMP: Recent Labs    08/22/23 0404 12/05/23 1258 12/25/23 0147 12/25/23 0152 12/26/23 0237  NA 136 137 142 139 137  K 4.0 3.8 3.5 3.4* 4.0  CL 98 96* 98 99 95*  CO2 30 24 26   --  29  GLUCOSE 98 92 148* 140* 93  BUN 20 23* 37* 33* 23*  CALCIUM  10.2 8.3* 8.6*  --  8.5*  CREATININE 7.15* 8.68* 10.23* 10.30* 7.57*  GFRNONAA 8* 6* 5*  --  8*    LIVER FUNCTION TESTS: Recent Labs    08/18/23 1239 08/20/23 1221 08/21/23 0604 12/05/23 1258 12/25/23 0147  BILITOT 1.1 1.0  --  2.2* 1.3*  AST 18 15  --  28 21  ALT 10 10  --  31 14  ALKPHOS 66 59  --  82 78  PROT 7.1 6.9  --  7.3 6.7  ALBUMIN  3.6 3.5 3.2* 3.5 3.2*    Assessment and Plan:  S/p left renal embolization with IR 12/25/23 for left perinephric hematoma with active extravasation - still having intense waxing/waning abd pain - no other new sx reported - hgb 7.0 and getting active transfusion at bedside - Groin access site looks great, bandage was already removed upon arrival to room. - No further intervention planned by IR, please reach out with any future questions or concerns.  Electronically Signed: Kimble VEAR Clas, PA-C 12/26/2023, 1:56 PM   I spent a total of 15 Minutes at the the patient's bedside AND on the patient's hospital floor or unit, greater than 50% of which was counseling/coordinating care for left renal embolization

## 2023-12-26 NOTE — Assessment & Plan Note (Addendum)
 S/P L renal artery embolization 11/21. 1 unit RBC given 0300 11/22 - H&H q6h  - serial abdominal exams  - Pain: tylenol  500 q6h, oxycodone  5 mg q4h, dilaudid  1 mg q4h  - Claritin  10 mg PO for itching added  - Zofran  4 mg ODT for nausea with opiates, Qtc reviewed and appropriate  - IR following, appreciate recommendations  - repeat CT AP for acute drop in Hgb or worsening abdominal pain - transfuse <8 with heart history

## 2023-12-26 NOTE — Assessment & Plan Note (Signed)
 Soft BP overnight. Monitor pressure in setting of recent bleed. - continue Amlodipine  10 mg daily  - continue Metoprolol  50 mg BID  - continue Isosorbide  dinitrate 20 mg BID  - Hold clonidine  0.3 mg patch, monitor for rebound HTN  - Stop Hydralazine  50 mg BID

## 2023-12-26 NOTE — Progress Notes (Signed)
 Pt requesting assistance in locating his eyeglasses. Entirety of bed, linens, and belongings bag checked but no eyeglasses apparent. [facility] and HD both contacted and also unsuccessful at locating pt's eyeglasses. Pt endorses the last time he remembered having his glasses was in the ICU.

## 2023-12-26 NOTE — Progress Notes (Signed)
 [facility] Progress Note  Subjective:  Had HD yest evening. 2.5 L off. Feeling better resp wise Having lots of abd pain  Vitals:   12/26/23 0420 12/26/23 0500 12/26/23 0530 12/26/23 0855  BP: 122/69  101/80 112/73  Pulse: (!) 42  (!) 47 (!) 49  Resp: 14  18 18   Temp: (!) 97.5 F (36.4 C)  (!) 97.5 F (36.4 C) 97.9 F (36.6 C)  TempSrc: Oral  Oral Oral  SpO2: 100%  100% 98%  Weight:  68.3 kg    Height:        Exam: Gen alert, no distress, 2L [place] O2 Sclera anicteric, throat clear  No jvd or bruits Chest clear bilat to bases RRR no MRG Abd soft ntnd no mass or ascites +bs Ext no pitting LE or UE edema Neuro is alert, Ox 3 , nf    RIJ TDC intact   Home bp meds: Norvarc 10mg  every day Catapres  patch 0.3mg / 24hr Hydralazine  50mg  bid Metoprolol  50mg  bid     OP HD: [facility] 4h  B400  86.7kg  2K bath  TDC  Heparin  4500 + 2000 midrun Last OP HD 11/19, post wt 90.8kg (+4kg) Usually doesn't get to dry wt, come off 1-6 kg over  Signs off early at 2.5- 3.5 hrs usually  CXR 11/21 - pulm edema   Assessment/ Plan: L renal/ perinephric bleed: w/ assoc RP hemorrhage. CTA showed extravasation and IR did L renal artery embolization procedure early this morning. Having lots of abd pain today, this could be related to the renal embolization (renal infarcts usually have some type of pain associated). Will notify pmd.  ESRD: on HD MWF. Had HD yesterday. Next HD Sunday per holiday schedule.  HTN: bp's stable, getting most of his home meds Volume: CXR 11/21 showed pulm edema. Got 2.5 L off w/ HD yesterday and O2 is down from 6L to 2L today. HD tomorrow w/ further vol removal.   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  transfusing prn.      Myer Fret MD  [facility] 12/26/2023, 11:24 AM  Recent Labs  Lab 12/25/23 0147 12/25/23 0152 12/25/23 0837 12/26/23 0237 12/26/23 0652  HGB 9.6* 10.5*   < > 6.1* 7.2*  ALBUMIN  3.2*  --   --   --   --   CALCIUM  8.6*  --   --  8.5*  --    PHOS  --   --   --  5.2*  --   CREATININE 10.23* 10.30*  --  7.57*  --   K 3.5 3.4*  --  4.0  --    < > = values in this interval not displayed.   No results for input(s): IRON , TIBC, FERRITIN in the last 168 hours. Inpatient medications:  acetaminophen   500 mg Oral Q6H   amLODipine   10 mg Oral QHS   arformoterol   15 mcg Nebulization BID   And   umeclidinium bromide   1 puff Inhalation Daily   Chlorhexidine  Gluconate Cloth  6 each Topical Daily   cinacalcet   90 mg Oral Q breakfast   isosorbide  dinitrate  20 mg Oral BID   metoprolol  tartrate  50 mg Oral BID   pantoprazole   40 mg Oral Daily   rosuvastatin   10 mg Oral Daily    albuterol , docusate sodium , HYDROmorphone  (DILAUDID ) injection **OR** [DISCONTINUED]  HYDROmorphone  (DILAUDID ) injection, mouth rinse, oxyCODONE , polyethylene glycol

## 2023-12-26 NOTE — Assessment & Plan Note (Signed)
 HFpEF: monitor volume status with transfusion  GERD: Protonix  40 mg daily  HLD: Crestor  10 mg daily  Secondary hyperparathyroidism: Sensipar  90 mg daily  COPD: home Stiolto Respimat , on formulary Brovana  neb BID and Incruse Ellipta 

## 2023-12-27 DIAGNOSIS — N2889 Other specified disorders of kidney and ureter: Secondary | ICD-10-CM

## 2023-12-27 LAB — HEMOGLOBIN AND HEMATOCRIT, BLOOD
HCT: 22.7 % — ABNORMAL LOW (ref 39.0–52.0)
HCT: 22.8 % — ABNORMAL LOW (ref 39.0–52.0)
HCT: 29.3 % — ABNORMAL LOW (ref 39.0–52.0)
HCT: 26.3 % — ABNORMAL LOW (ref 39.0–52.0)
Hemoglobin: 7.3 g/dL — ABNORMAL LOW (ref 13.0–17.0)
Hemoglobin: 7.4 g/dL — ABNORMAL LOW (ref 13.0–17.0)
Hemoglobin: 9.7 g/dL — ABNORMAL LOW (ref 13.0–17.0)
Hemoglobin: 8.7 g/dL — ABNORMAL LOW (ref 13.0–17.0)

## 2023-12-27 LAB — RENAL FUNCTION PANEL
Albumin: 2.7 g/dL — ABNORMAL LOW (ref 3.5–5.0)
Anion gap: 14 (ref 5–15)
BUN: 31 mg/dL — ABNORMAL HIGH (ref 6–20)
CO2: 27 mmol/L (ref 22–32)
Calcium: 8 mg/dL — ABNORMAL LOW (ref 8.9–10.3)
Chloride: 96 mmol/L — ABNORMAL LOW (ref 98–111)
Creatinine, Ser: 9.09 mg/dL — ABNORMAL HIGH (ref 0.61–1.24)
GFR, Estimated: 6 mL/min — ABNORMAL LOW (ref >60)
Glucose, Bld: 82 mg/dL (ref 70–99)
Phosphorus: 5.5 mg/dL — ABNORMAL HIGH (ref 2.5–4.6)
Potassium: 4.6 mmol/L (ref 3.5–5.1)
Sodium: 137 mmol/L (ref 135–145)

## 2023-12-27 LAB — PREPARE RBC (CROSSMATCH)

## 2023-12-27 MED ORDER — LIDOCAINE-PRILOCAINE 2.5-2.5 % EX CREA: 1.0000 | TOPICAL_CREAM | CUTANEOUS | Status: DC | PRN

## 2023-12-27 MED ORDER — PENTAFLUOROPROP-TETRAFLUOROETH EX AERO: 1.0000 | INHALATION_SPRAY | CUTANEOUS | Status: DC | PRN

## 2023-12-27 MED ORDER — SIMETHICONE 40 MG/0.6ML PO SUSP
40.0000 mg | Freq: Four times a day (QID) | ORAL | Status: DC | PRN
  Administered 2023-12-27 – 2023-12-31 (×4): 40 mg via ORAL
  Filled 2023-12-27 (×6): qty 0.6

## 2023-12-27 MED ORDER — POLYETHYLENE GLYCOL 3350 17 G PO PACK
17.0000 g | PACK | Freq: Every day | ORAL | Status: DC
  Administered 2023-12-29 – 2023-12-30 (×2): 17 g via ORAL
  Filled 2023-12-27 (×6): qty 1

## 2023-12-27 MED ORDER — FERRIC CITRATE 1 GM 210 MG(FE) PO TABS
630.0000 mg | ORAL_TABLET | Freq: Three times a day (TID) | ORAL | Status: DC
  Administered 2023-12-27 – 2024-01-02 (×13): 630 mg via ORAL
  Filled 2023-12-27 (×22): qty 3

## 2023-12-27 MED ORDER — ANTICOAGULANT SODIUM CITRATE 4% (200MG/5ML) IV SOLN: 5.0000 mL | Status: DC | PRN

## 2023-12-27 MED ORDER — CINACALCET HCL 30 MG PO TABS: 150.0000 mg | ORAL_TABLET | ORAL | Status: DC | PRN

## 2023-12-27 MED ORDER — SENNA 8.6 MG PO TABS
2.0000 | ORAL_TABLET | Freq: Every day | ORAL | Status: DC
  Administered 2023-12-29 – 2023-12-31 (×3): 17.2 mg via ORAL
  Filled 2023-12-27 (×6): qty 2

## 2023-12-27 MED ORDER — NEPRO/CARBSTEADY PO LIQD: 237.0000 mL | ORAL | Status: DC | PRN

## 2023-12-27 MED ORDER — DARBEPOETIN ALFA 150 MCG/0.3ML IJ SOSY
150.0000 ug | PREFILLED_SYRINGE | INTRAMUSCULAR | Status: DC
  Filled 2023-12-27: qty 0.3

## 2023-12-27 MED ORDER — HEPARIN SODIUM (PORCINE) 1000 UNIT/ML DIALYSIS: 1000.0000 [IU] | INTRAMUSCULAR | Status: DC | PRN

## 2023-12-27 MED ORDER — CINACALCET HCL 30 MG PO TABS
150.0000 mg | ORAL_TABLET | ORAL | Status: DC
Start: 2023-12-27 — End: 2024-01-02
  Administered 2023-12-29: 150 mg via ORAL
  Filled 2023-12-27 (×3): qty 5

## 2023-12-27 MED ORDER — DOXERCALCIFEROL 4 MCG/2ML IV SOLN
4.0000 ug | INTRAVENOUS | Status: DC
  Administered 2023-12-27: 4 ug via INTRAVENOUS
  Filled 2023-12-27 (×4): qty 2

## 2023-12-27 MED ORDER — LIDOCAINE HCL (PF) 1 % IJ SOLN: 5.0000 mL | INTRAMUSCULAR | Status: DC | PRN

## 2023-12-27 MED ORDER — ALTEPLASE 2 MG IJ SOLR: 2.0000 mg | Freq: Once | INTRAMUSCULAR | Status: DC | PRN

## 2023-12-27 MED ORDER — DOXERCALCIFEROL 4 MCG/2ML IV SOLN
4.0000 ug | INTRAVENOUS | Status: DC | PRN
  Administered 2024-01-01: 4 ug via INTRAVENOUS
  Filled 2023-12-27: qty 2

## 2023-12-27 MED ORDER — HEPARIN SODIUM (PORCINE) 1000 UNIT/ML IJ SOLN
INTRAMUSCULAR | Status: AC
Start: 2023-12-27 — End: 2023-12-27
  Filled 2023-12-27: qty 4

## 2023-12-27 NOTE — Assessment & Plan Note (Addendum)
-   Appreciate ongoing nephrology recommendations - HD per nephrology

## 2023-12-27 NOTE — Plan of Care (Signed)

## 2023-12-27 NOTE — Plan of Care (Addendum)
 Discussed patient with on-call urologist Dr. Norva; no acute interventions needed at this time. Appreciate recommendation for outpatient follow up. Will continue current management at this time.

## 2023-12-27 NOTE — Assessment & Plan Note (Addendum)
 Blood pressures mildly elevated overnight. - Continue home amlodipine  10 daily, metoprolol  50 twice daily, isosorbide  20 twice daily - Currently holding home clonidine  patch and hydralazine  due to soft blood pressures yesterday, resume as indicated

## 2023-12-27 NOTE — Plan of Care (Signed)
 FMTS Brief Progress Note  S:to bedside for night rounds Denies concerns at this time   O: BP 139/85 (BP Location: Right Arm)   Pulse 60   Temp 98.6 F (37 C) (Oral)   Resp 14   Ht 5' 5 (1.651 m)   Wt 78.7 kg   SpO2 98%   BMI 28.87 kg/m    Gen: NAD awake and alert CV: bradycardic, regular rhythm on monitor Pulm: normal WOB on RA  A/P:  Perinephric hematoma s/p L renal artery embolization 11/21 Hgb 9.7>8.7 after 1u pRBC earlier today Currently hemodynamically stable -continue q6h H/H -Transfuse <8 -consider repeat imaging if acutely worsening hgb  Sinus bradycardia to 50s  Stable Ok to hold PM dose of metop  Remainder per today's prog note  - Orders reviewed. Labs for AM ordered, which was adjusted as needed.  - If condition changes, plan includes page primary team.   Romelle Booty, MD 12/27/2023, 9:31 PM PGY-3, [facility] Night Resident  Please page [phone] with questions.

## 2023-12-27 NOTE — Progress Notes (Signed)
 [facility] Progress Note   Subjective:   Patient seen and examined at bedside in dialysis.  Tolerating treatment well so far.  Continues to have abdominal/back pain.  Denies SOB, CP, nausea and vomiting.   Objective Vitals:   12/27/23 0830 12/27/23 0834 12/27/23 0900 12/27/23 0915  BP: (!) 147/69 (!) 147/69 (!) 144/67 125/68  Pulse: 66 66 (!) 57 (!) 56  Resp: 17 17 20  (!) 26  Temp:  98 F (36.7 C)    TempSrc:  Oral    SpO2: 100%  100% 100%  Weight:      Height:       Physical Exam General:chronically ill appearing male in NAD Heart:RRR, no mrg Lungs:CTAB, nml WOB on 2L Abdomen:soft, NTND Extremities:no LE edema Dialysis Access: [facility]   Filed Weights   12/26/23 0500 12/27/23 0500 12/27/23 0723  Weight: 68.3 kg 76.6 kg 81.9 kg    Intake/Output Summary (Last 24 hours) at 12/27/2023 0924 Last data filed at 12/27/2023 0915 Gross per 24 hour  Intake 748 ml  Output --  Net 748 ml    Additional Objective Labs: Basic Metabolic Panel: Recent Labs  Lab 12/25/23 0147 12/25/23 0152 12/26/23 0237 12/27/23 0124  NA 142 139 137 137  K 3.5 3.4* 4.0 4.6  CL 98 99 95* 96*  CO2 26  --  29 27  GLUCOSE 148* 140* 93 82  BUN 37* 33* 23* 31*  CREATININE 10.23* 10.30* 7.57* 9.09*  CALCIUM  8.6*  --  8.5* 8.0*  PHOS  --   --  5.2* 5.5*   Liver Function Tests: Recent Labs  Lab 12/25/23 0147 12/27/23 0124  AST 21  --   ALT 14  --   ALKPHOS 78  --   BILITOT 1.3*  --   PROT 6.7  --   ALBUMIN  3.2* 2.7*   Recent Labs  Lab 12/25/23 0147  LIPASE 33   CBC: Recent Labs  Lab 12/25/23 0147 12/25/23 0152 12/25/23 0837 12/25/23 1353 12/26/23 0237 12/26/23 0652 12/26/23 2056 12/27/23 0124 12/27/23 0334  WBC 6.7  --  6.5 6.6 5.9  --   --   --   --   HGB 9.6*   < > 7.4* 7.5* 6.1*   < > 7.9* 7.3* 7.4*  HCT 30.2*   < > 23.1* 23.4* 19.4*   < > 24.0* 22.7* 22.8*  MCV 101.3*  --  101.8* 101.7* 102.1*  --   --   --   --   PLT 176  --  149* 133* 136*  --   --    --   --    < > = values in this interval not displayed.   Medications:   acetaminophen   500 mg Oral Q6H   amLODipine   10 mg Oral QHS   arformoterol   15 mcg Nebulization BID   And   umeclidinium bromide   1 puff Inhalation Daily   Chlorhexidine  Gluconate Cloth  6 each Topical Daily   Chlorhexidine  Gluconate Cloth  6 each Topical Q0600   cinacalcet   90 mg Oral Q breakfast   isosorbide  dinitrate  20 mg Oral BID   loratadine   10 mg Oral Daily   metoprolol  tartrate  50 mg Oral BID   pantoprazole   40 mg Oral Daily   rosuvastatin   10 mg Oral Daily    Dialysis Orders:  [facility] MWF 4h  B400  86.7kg  2K bath  TDC  Heparin  4500 + 2000 midrun Last OP HD 11/19, post  wt 90.8kg (+4kg) Mircera 150mcg q2wks - last 10/27 Hectorol  4mcg qHD Sensipar  120mg  qHD Usually doesn't get to dry wt, come off 1-6 kg over  Signs off early at 2.5- 3.5 hrs usually  Assessment/Plan: L renal/ perinephric bleed: w/ assoc RP hemorrhage. CTA showed extravasation and IR did L renal artery embolization procedure on 12/25/23. Pain better but ongoing. Pain managed by PMD.  ESRD: on HD MWF. Due to Holiday schedule will run on Sunday, Tuesday and Friday this week.  HD today.  HTN: stable, continue home meds.  Volume: CXR 11/21 showed pulm edema.O2 requirement improved to 2L following HD on Friday.  Continue UF as tolerated.  Under EDW. Need a standing weight at some point to better assess. May need to be lowered. 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  transfusing prn. ESA due, will order. Secondary hyperparathyroidism - Ca and phos in goal. Continue home meds. Nutrition - renal diet with fluid restrictions.   Manuelita Labella, PA-C [facility] 12/27/2023,9:24 AM  LOS: 2 days

## 2023-12-27 NOTE — Progress Notes (Signed)
   12/27/23 2118  ECG Monitoring  Ectopy Sinus pause (2.2s pause. Notified Yvette, RN @2119 .)  Provider Notification  Provider Name/Title Dr. Romelle  Date Provider Notified 12/27/23  Time Provider Notified 2122  Method of Notification Face-to-face  Notification Reason  (sinus pause)  Provider response At bedside;Other (Comment) (to hold PM metoprolol  dose)  Date of Provider Response 12/27/23  Time of Provider Response 2122

## 2023-12-27 NOTE — Assessment & Plan Note (Addendum)
 S/p L renal artery embolization 11/21.  Receiving blood transfusion as needed for transfusion threshold hemoglobin < 8. - Receiving 1 unit PRBC this morning, plan for post H&H - Continue H&H Q6 - Pain regimen: Tylenol  500 every 6, oxycodone  5 Q6 as needed, Dilaudid  1 every 4 as needed for breakthrough pain - Simethicone  as needed added for bloating/flatulence - Added bowel regimen: MiraLAX  and senna daily - Continue Zofran  ODT as needed - Consider repeat CT AP imaging for uncontrolled pain or bleeding concerns

## 2023-12-27 NOTE — Assessment & Plan Note (Addendum)
 HFpEF: CTM volume status.  Medications as above. GERD: Protonix  40 mg daily  HLD: Crestor  10 mg daily  Secondary hyperparathyroidism: Sensipar  90 mg daily  COPD: home Stiolto Respimat , on formulary Brovana  neb BID and Incruse Ellipta 

## 2023-12-27 NOTE — Progress Notes (Addendum)
 Daily Progress Note Intern Pager: [phone]  Patient name: James Wall [facility] Medical record number: [id number] Date of birth: [birth year] Age: 58 y.o. Gender: male  Primary Care Provider: Janna Ferrier, DO Consultants: PCCM, IR, nephrology, VSS, urology Code Status: Full  Pt Overview and Major Events to Date:  11/21: Admitted to ICU, IR embolization 11/22: Transferred to FM TS, transfuse 1 unit PRBC 11/23: Transfuse 1 unit PRBC  Medical Decision Making:  James Wall 58 y.o. 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 Assessment & Plan Perinephric hematoma S/p L renal artery embolization 11/21.  Receiving blood transfusion as needed for transfusion threshold hemoglobin < 8. - Receiving 1 unit PRBC this morning, plan for post H&H - Continue H&H Q6 - Pain regimen: Tylenol  500 every 6, oxycodone  5 Q6 as needed, Dilaudid  1 every 4 as needed for breakthrough pain - Simethicone  as needed added for bloating/flatulence - Added bowel regimen: MiraLAX  and senna daily - Continue Zofran  ODT as needed - Consider repeat CT AP imaging for uncontrolled pain or bleeding concerns HTN (hypertension) Blood pressures mildly elevated overnight. - Continue home amlodipine  10 daily, metoprolol  50 twice daily, isosorbide  20 twice daily - Currently holding home clonidine  patch and hydralazine  due to soft blood pressures yesterday, resume as indicated ESRD (end stage renal disease) on dialysis (HCC) - Appreciate ongoing nephrology recommendations - HD per nephrology Chronic health problem HFpEF: CTM volume status.  Medications as above. GERD: Protonix  40 mg daily  HLD: Crestor  10 mg daily  Secondary hyperparathyroidism: Sensipar  90 mg daily  COPD: home Stiolto Respimat , on formulary Brovana  neb BID and Incruse  Ellipta  FEN/GI: Renal diet with 1200 fluid restriction PPx: SCDs, heparin  with dialysis Dispo: Pending clinical stability and PT/OT eval  Subjective:  Reports continued abdominal pain this morning, more left-sided.  Reports sensation of bloating and gassiness.  Has been passing gas.  Objective: Temp:  [97 F (36.1 C)-98.8 F (37.1 C)] 98.8 F (37.1 C) (11/23 0527) Pulse Rate:  [49-68] 68 (11/23 0527) Resp:  [14-24] 24 (11/23 0527) BP: (109-155)/(61-94) 154/94 (11/23 0527) SpO2:  [91 %-100 %] 91 % (11/23 0527) Weight:  [76.6 kg] 76.6 kg (11/23 0500) Physical Exam: General: No acute distress.  Resting comfortably in dialysis bed. Cardiovascular: S1/S2.  No extra heart sounds. Respiratory: CTAB of anterior fields.  No respiratory distress. Abdomen: Distended.  Normoactive bowel sounds.  Tender to palpation of left sided abdomen.  No rebound or guarding. Extremities: No significant extremity swelling, trace edema of bilateral ankles.  Laboratory: Most recent CBC Lab Results  Component Value Date   WBC 5.9 12/26/2023   HGB 7.4 (L) 12/27/2023   HCT 22.8 (L) 12/27/2023   MCV 102.1 (H) 12/26/2023   PLT 136 (L) 12/26/2023   Most recent BMP    Latest Ref Rng & Units 12/27/2023    1:24 AM  BMP  Glucose 70 - 99 mg/dL 82   BUN 6 - 20 mg/dL 31   Creatinine 9.38 - 1.24 mg/dL 0.90   Sodium 864 - 854 mmol/L 137   Potassium 3.5 - 5.1 mmol/L 4.6   Chloride 98 - 111 mmol/L 96   CO2 22 - 32 mmol/L 27   Calcium  8.9 - 10.3 mg/dL 8.0     James Perkins, MD 12/27/2023, 7:13 AM  PGY-2, [facility] FPTS Intern  pager: [phone], text pages welcome Secure chat group [facility] Teaching Service

## 2023-12-27 NOTE — Progress Notes (Signed)
  Received patient in bed to unit.   Informed consent signed and in chart.    TX duration:     Transported by  Hand-off given to patient's nurse.    Access used: rt HD cath Access issues: Ran reversed   Total UF removed: 3000 Medication(s) given: None Completed RBC at 0735 Post HD VS: 15/83 Post HD weight: 78.7 kg     Hunter Hacking LPN Kidney Dialysis Unit

## 2023-12-28 ENCOUNTER — Inpatient Hospital Stay (HOSPITAL_COMMUNITY)

## 2023-12-28 ENCOUNTER — Telehealth (HOSPITAL_COMMUNITY): Payer: Self-pay

## 2023-12-28 ENCOUNTER — Other Ambulatory Visit (HOSPITAL_COMMUNITY): Payer: Self-pay

## 2023-12-28 DIAGNOSIS — N186 End stage renal disease: Secondary | ICD-10-CM

## 2023-12-28 DIAGNOSIS — K683 Retroperitoneal hematoma: Secondary | ICD-10-CM

## 2023-12-28 DIAGNOSIS — N2889 Other specified disorders of kidney and ureter: Secondary | ICD-10-CM | POA: Diagnosis not present

## 2023-12-28 DIAGNOSIS — I5032 Chronic diastolic (congestive) heart failure: Secondary | ICD-10-CM

## 2023-12-28 DIAGNOSIS — R001 Bradycardia, unspecified: Secondary | ICD-10-CM | POA: Diagnosis not present

## 2023-12-28 DIAGNOSIS — I10 Essential (primary) hypertension: Secondary | ICD-10-CM

## 2023-12-28 DIAGNOSIS — Z992 Dependence on renal dialysis: Secondary | ICD-10-CM

## 2023-12-28 DIAGNOSIS — I455 Other specified heart block: Secondary | ICD-10-CM | POA: Insufficient documentation

## 2023-12-28 DIAGNOSIS — I4430 Unspecified atrioventricular block: Secondary | ICD-10-CM | POA: Diagnosis not present

## 2023-12-28 LAB — TYPE AND SCREEN
ABO/RH(D): O POS
Antibody Screen: NEGATIVE
Unit division: 0
Unit division: 0
Unit division: 0

## 2023-12-28 LAB — RENAL FUNCTION PANEL
Albumin: 2.7 g/dL — ABNORMAL LOW (ref 3.5–5.0)
Anion gap: 13 (ref 5–15)
BUN: 21 mg/dL — ABNORMAL HIGH (ref 6–20)
CO2: 26 mmol/L (ref 22–32)
Calcium: 7.9 mg/dL — ABNORMAL LOW (ref 8.9–10.3)
Chloride: 95 mmol/L — ABNORMAL LOW (ref 98–111)
Creatinine, Ser: 6.84 mg/dL — ABNORMAL HIGH (ref 0.61–1.24)
GFR, Estimated: 9 mL/min — ABNORMAL LOW (ref >60)
Glucose, Bld: 95 mg/dL (ref 70–99)
Phosphorus: 3.4 mg/dL (ref 2.5–4.6)
Potassium: 3.9 mmol/L (ref 3.5–5.1)
Sodium: 134 mmol/L — ABNORMAL LOW (ref 135–145)

## 2023-12-28 LAB — CBC
HCT: 24.6 % — ABNORMAL LOW (ref 39.0–52.0)
Hemoglobin: 8.2 g/dL — ABNORMAL LOW (ref 13.0–17.0)
MCH: 31.8 pg (ref 26.0–34.0)
MCHC: 33.3 g/dL (ref 30.0–36.0)
MCV: 95.3 fL (ref 80.0–100.0)
Platelets: 141 K/uL — ABNORMAL LOW (ref 150–400)
RBC: 2.58 MIL/uL — ABNORMAL LOW (ref 4.22–5.81)
RDW: 20.8 % — ABNORMAL HIGH (ref 11.5–15.5)
WBC: 6.5 K/uL (ref 4.0–10.5)
nRBC: 0.3 % — ABNORMAL HIGH (ref 0.0–0.2)

## 2023-12-28 LAB — BPAM RBC
Blood Product Expiration Date: 202512152359
Blood Product Expiration Date: 202511272359
Blood Product Expiration Date: 202512192359
ISSUE DATE / TIME: 202511230506
ISSUE DATE / TIME: 202511220359
ISSUE DATE / TIME: 202511221303
Unit Type and Rh: 5100
Unit Type and Rh: 5100
Unit Type and Rh: 5100

## 2023-12-28 LAB — HEMOGLOBIN AND HEMATOCRIT, BLOOD
HCT: 25 % — ABNORMAL LOW (ref 39.0–52.0)
HCT: 28.3 % — ABNORMAL LOW (ref 39.0–52.0)
HCT: 26 % — ABNORMAL LOW (ref 39.0–52.0)
Hemoglobin: 8.1 g/dL — ABNORMAL LOW (ref 13.0–17.0)
Hemoglobin: 9.4 g/dL — ABNORMAL LOW (ref 13.0–17.0)
Hemoglobin: 8.6 g/dL — ABNORMAL LOW (ref 13.0–17.0)

## 2023-12-28 LAB — MAGNESIUM: Magnesium: 2 mg/dL (ref 1.7–2.4)

## 2023-12-28 MED ORDER — IOHEXOL 350 MG/ML SOLN
75.0000 mL | Freq: Once | INTRAVENOUS | Status: AC | PRN
  Administered 2023-12-28: 75 mL via INTRAVENOUS

## 2023-12-28 MED ORDER — DARBEPOETIN ALFA 150 MCG/0.3ML IJ SOSY
150.0000 ug | PREFILLED_SYRINGE | Freq: Once | INTRAMUSCULAR | Status: AC
  Administered 2023-12-28: 150 ug via SUBCUTANEOUS
  Filled 2023-12-28: qty 0.3

## 2023-12-28 MED ORDER — HYDRALAZINE HCL 25 MG PO TABS
25.0000 mg | ORAL_TABLET | Freq: Three times a day (TID) | ORAL | Status: DC
  Administered 2023-12-28 – 2023-12-29 (×3): 25 mg via ORAL
  Filled 2023-12-28 (×3): qty 1

## 2023-12-28 MED ORDER — CHLORHEXIDINE GLUCONATE CLOTH 2 % EX PADS
6.0000 | MEDICATED_PAD | Freq: Every day | CUTANEOUS | Status: DC
  Administered 2023-12-28 – 2024-01-02 (×6): 6 via TOPICAL

## 2023-12-28 NOTE — Consult Note (Addendum)
 Cardiology Consultation   Patient ID: James Wall MRN: [id number]; DOB: [birth year]  Admit date: 12/25/2023 Date of Consult: 12/28/2023  PCP:  Janna Ferrier, DO    [facility] Providers Cardiologist:  Stanly DELENA Leavens, MD     Patient Profile: James Wall is a 58 y.o. male with a hx of tobacco use, COPD, hypertension, hyperlipidemia, cirrhosis, ESRD on HD, heart failure with improved LVEF, paroxysmal SVT, prior CVA on 09/2021, OSA, and prostate cancer initially diagnosed in 2023 who is being seen 12/28/2023 for the evaluation of bradycardia at the request of [employer].  History of Present Illness: James Wall is a 58 year old male with prior cardiac history listed below.  In 2015 the patient was seen for exertional dyspnea and fatigue.  A Lexiscan  was ordered and was intermediate risk for anterior wall ischemia.  Echocardiogram showed a normal LVEF.  A cardiac catheterization was done on 07/2013 that showed no CAD.  A Holter monitor was ordered and showed frequent PVCs.  In 2019 the patient was hospitalized for hypertensive urgency.  An echo showed a decreased LVEF of 45 to 50%, severe pulmonary hypertension ( ).  A left and right heart cath was recommended but the patient refused.  He was started on antihypertensive medications. He was also started on metoprolol  for palpitations and PVCs.  On 03/2019 the patient wore a cardiac monitor that showed few short runs of SVT, rare PVCs, and PACs.  At that time an echocardiogram was also done and showed a normal LVEF of 55 to 60%, moderate LVH, mildly elevated PASP, severe LAE, and normal RV function.  Patient was on multiple medications for his hypertension.  In 2023 the patient was hospitalized for a stroke.    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  Echocardiogram at this time showed a normal LVEF of  60 to 65%, moderate LVH, G2 DD, and aortic sclerosis.  On 08/2023 the patient presented to the emergency department for worsening dyspnea on exertion.  He was felt to be in heart failure.  He was noted to previously have missed dialysis. Echocardiogram during this hospitalization showed a normal LVEF of 60 to 65%, mild concentric LVH, normal RV systolic function, severely elevated pulmonary artery systolic pressures, moderately dilated left atrium, and mild MR. The patient was seen in the office for outpatient follow-up by [facility] on 09/2023.  He reported ongoing dyspnea on exertion.  At this appointment a cardiac catheterization was discussed with the patient but he refused because he wanted general anesthesia.  The patient was felt to be volume up.  The patient did report that he leaves dialysis sessions early.  The patient felt that his metoprolol  was contributing to his fatigue and bradycardia.  His metoprolol  to tartrate was reduced from 75 mg twice daily to 50 mg twice daily.  A ZIO monitor was ordered but it does not look like this has been completed.  Patient presented to the emergency department for abdominal pain.  Abdominal CT showed left renal perinephritic bleed.  IR did a left renal artery embolization procedure on 12/25/2023.  On telemetry the patient reportedly had a 5.3-second pause.  On interview the patient reported that he has continued to have dyspnea on exertion.  Denies any chest pains or chest tightness.  Denies any lightheadedness or dizziness.  Reported that he was in bed and likely sleeping when these pauses happened.  Stated that he  was diagnosed with a sleep apnea and started wearing a CPAP about 13-14 years ago.  Stated that he wore the CPAP for about a year and has not worn it since.  Reported that he has got alerts overnight because his pulse ox drops into the 30s and 40s when he sleeps.  Does not know if he snores because he sleeps alone.  Does have some daytime somnolence  and poor sleep quality.  Reported that he quit smoking on August of this year.  The patient reported that he had worn the ZIO monitor that was ordered in August.  Reported that it had sat in his mail for several weeks before it was eventually picked up.  Labs showed potassium of 3.9, elevated BUN of 21, creatinine of 6.84 has ESRD on HD, albumin  of 2.7, anemia with a hemoglobin of 9.4, and WBC count of 6.5.  EKG showed normal sinus rhythm with a rate of 63, first-degree AV block, LVH, inferior lateral T wave inversions, QT prolongation with a QTcB of 485.  T wave inversions were present on prior EKG.  Chest x-ray showed New small left pleural effusion with worsening left basilar aeration, which may reflect atelectasis or airspace disease, and mild pulmonary edema  CT abdomen and pelvis with contrast on 12/25/2023 showed large left perinephric hematoma, extensive multivessel coronary artery calcifications, mild cardiomegaly, and small hiatal hernia.   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 [facility])    primary cancer is prostate   Anemia associated with chronic renal failure    Anxiety    Arthritis    CHF (congestive heart failure) (HCC)    Cirrhosis, nonalcoholic (HCC)    per pt possible from a medication , unsure ;   last ultrasound 08-09-2020 in epic no fibrosis   COPD (chronic obstructive pulmonary disease) (HCC)    Depression    ESRD on hemodialysis (HCC) 2009   Started HD Jan 2009;  ESRD secondary to hypertensive nephrosclerosis;  dialysis at [facility] on MWF   First degree heart block    GERD (gastroesophageal reflux disease)    Hiatal hernia    History of acute respiratory failure 07/2013   admission;  HCAP w/ ARF with hypoxia   History of adenomatous polyp of colon    History of ascites    s/p paracentesis 01-31-2013 (5L) and last one 03-28-2013 (2.7L)   History of community acquired  pneumonia 08/08/2020   admission ; LLL , POA   History of heart murmur in childhood    History of MRSA infection 12/19/2012   hospital admission;  w/ sepsis MRSA bacteramia AVF infection   History of sepsis 03/2017   admission;   HCAP w/ sepsis   Hyperlipidemia    Hypertension    Hypertensive heart disease    cardiologist--- dr emerson leavens;  nuclear stress test 06-16-2013 intermediate risk w/ mid-distal anterior wall ischemia , not gated;  cardiac cath 07-13-2013 in epic showed normal coronaries and LVSF,  ef not assessed, LCEDP   Hypertensive nephrosclerosis, 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)    Intolerance to cold    due to anemia   Malignant neoplasm prostate [facility])    urologist--- dr bell/  radiation onologist--- dr patrcia;  dx 01/ 2023,  Gleason 4+3, PSA 32   Nausea and vomiting 10/19/2022   NICM (nonischemic cardiomyopathy) (HCC)    followed by cardiology;  last echo in epic 09-13-2020 ef 55-60%   OSA (obstructive sleep apnea) 2009   no  longer using cpap since the yr started 2009;   sleep study in epic 05-11-2007 severe osa   Pneumonia    PSVT (paroxysmal supraventricular tachycardia)    event monitor 08-01-2019  SR w/ SVT runs , rare PAC/ PVC   Secondary hyperparathyroidism    Stroke (HCC)    2023 weakness Lhand and Left leg   Wears glasses     Past Surgical History:  Procedure Laterality Date   AV FISTULA PLACEMENT Right 02/22/2013   Procedure:  CREATION  OF BRACHIAL CEPHALIC FISTULA RIGHT ARM;  Surgeon: Carlin FORBES Haddock, MD;  Location: [facility] OR;  Service: Vascular;  Laterality: Right;   AV FISTULA PLACEMENT Left 08/10/2014   Procedure: BASILIC VEIN TRANSPOSITION  ARTERIOVENOUS (AV) FISTULA CREATION LEFT UPPER ARM;  Surgeon: Lynwood JONETTA Collum, MD;  Location: [facility] OR;  Service: Vascular;  Laterality: Left;   AV FISTULA PLACEMENT, RADIOCEPHALIC Left 02/22/2007   @[facility] ;  Left lower arm   COLONOSCOPY  11/30/2018   by dr beavers   CYSTOSCOPY  W/ RETROGRADES Bilateral 06/26/2022   Procedure: CYSTOSCOPY WITH BILATERAL RETROGRADE PYELOGRAM;  Surgeon: Carolee Sherwood JONETTA DOUGLAS, MD;  Location: [facility] ORS;  Service: Urology;  Laterality: Bilateral;   ESOPHAGOGASTRODUODENOSCOPY (EGD) WITH PROPOFOL  N/A 04/12/2013   Procedure: ESOPHAGOGASTRODUODENOSCOPY (EGD) WITH PROPOFOL ;  Surgeon: Elsie Cree, MD;  Location: [facility] ENDOSCOPY;  Service: Endoscopy;  Laterality: N/A;   EXCHANGE OF A DIALYSIS CATHETER Right 09/24/2021   Procedure: EXCHANGE OF A DIALYSIS CATHETER;  Surgeon: Eliza Lonni RAMAN, MD;  Location: [facility] OR;  Service: Vascular;  Laterality: Right;   GOLD SEED IMPLANT N/A 06/11/2021   Procedure: GOLD SEED IMPLANT;  Surgeon: Selma Donnice SAUNDERS, MD;  Location: [facility] ORS;  Service: Urology;  Laterality: N/A;   INSERTION OF DIALYSIS CATHETER N/A 12/23/2012   Procedure: INSERTION OF DIALYSIS CATHETER; ULTRASOUND GUIDED;  Surgeon: Lonni RAMAN Eliza, MD;  Location: [facility] OR;  Service: Vascular;  Laterality: N/A;   INSERTION OF DIALYSIS CATHETER  10/22/2015   Right IJ non-tunneled HD catheter, placed again in 2019   IR ANGIOGRAM SELECTIVE EACH ADDITIONAL VESSEL  12/25/2023   IR ANGIOGRAM SELECTIVE EACH ADDITIONAL VESSEL  12/25/2023   IR ANGIOGRAM SELECTIVE EACH ADDITIONAL VESSEL  12/25/2023   IR EMBO ART  VEN HEMORR LYMPH EXTRAV  INC GUIDE ROADMAPPING  12/25/2023   IR FLUORO GUIDE CV LINE RIGHT  08/18/2019   IR FLUORO GUIDE CV LINE RIGHT  10/07/2019   IR GENERIC HISTORICAL  10/22/2015   IR US  GUIDE VASC ACCESS RIGHT 10/22/2015 [facility]   IR GENERIC HISTORICAL  10/22/2015   IR FLUORO GUIDE CV LINE RIGHT 10/22/2015 [facility]   IR GENERIC HISTORICAL  10/23/2015   IR FLUORO GUIDE CV LINE RIGHT 10/23/2015 Rome Hall, MD [facility]   IR RENAL SELECTIVE  UNI INC S&I MOD SED  12/25/2023   IR US  GUIDE VASC ACCESS RIGHT  12/25/2023   LEFT HEART CATHETERIZATION WITH CORONARY ANGIOGRAM N/A 07/13/2013   Procedure: LEFT HEART CATHETERIZATION WITH CORONARY  ANGIOGRAM;  Surgeon: Candyce RAMAN Reek, MD;  Location: [facility] CATH LAB;  Service: Cardiovascular;  Laterality: N/A;   LIGATION OF ARTERIOVENOUS  FISTULA Left 12/22/2012   Procedure: LIGATION OF ARTERIOVENOUS  FISTULA;EXCISION OF LARGE ANEURYSMS;;  Surgeon: Carlin FORBES Haddock, MD;  Location: [facility] OR;  Service: Vascular;  Laterality: Left;   SPACE OAR INSTILLATION N/A 06/11/2021   Procedure: SPACE OAR INSTILLATION;  Surgeon: Selma Donnice SAUNDERS, MD;  Location: [facility] ORS;  Service: Urology;  Laterality: N/A;   UPPER GASTROINTESTINAL ENDOSCOPY  09/07/2018   by dr eda     Home Medications:  Prior to Admission medications   Medication Sig Start Date End Date Taking? Authorizing Provider  albuterol  (VENTOLIN  HFA) 108 (90 Base) MCG/ACT inhaler Inhale 2 puffs into the lungs every 6 (six) hours as needed for wheezing or shortness of breath. 08/27/23  Yes Howell Lunger, DO  amLODipine  (NORVASC ) 10 MG tablet Take 1 tablet (10 mg total) by mouth at bedtime. 12/05/21  Yes Chandrasekhar, Mahesh A, MD  cinacalcet  (SENSIPAR ) 90 MG tablet Take 90 mg by mouth daily.   Yes [provider]  cloNIDine  (CATAPRES  - DOSED IN MG/24 HR) 0.3 mg/24hr patch Place 0.3 mg onto the skin once a week.   Yes [provider]  ferric citrate  (AURYXIA ) 1 GM 210 MG(Fe) tablet Take 630 mg by mouth 3 (three) times daily with meals. And 210mg  (1 tablet) up to three times a day with snacks.   Yes [provider]  hydrALAZINE  (APRESOLINE ) 50 MG tablet Take 1 tablet (50 mg total) by mouth 2 (two) times daily. Patient taking differently: Take 50 mg by mouth daily. 12/05/21  Yes Chandrasekhar, Mahesh A, MD  hydrOXYzine  (ATARAX ) 25 MG tablet Take 25 mg by mouth daily.   Yes [provider]  isosorbide  dinitrate (ISORDIL ) 20 MG tablet Take 1 tablet (20 mg total) by mouth 2 (two) times daily. 12/05/21  Yes Chandrasekhar, Mahesh A, MD  metoprolol  tartrate (LOPRESSOR ) 50 MG tablet Take 1 tablet (50 mg total) by mouth 2 (two)  times daily. 10/01/23 12/30/23 Yes West, Katlyn D, NP  pantoprazole  (PROTONIX ) 20 MG tablet TAKE 1 TABLET(20 MG) BY MOUTH DAILY Patient taking differently: Take 40 mg by mouth daily. 01/09/22  Yes [name], DO  rosuvastatin  (CRESTOR ) 10 MG tablet Take 1 tablet (10 mg total) by mouth daily. 12/24/23  Yes Janna Ferrier, DO  CINACALCET  HCL PO Take 120 mg by mouth every Monday, Wednesday, and Friday with hemodialysis. Provided and administered by dialysis clinic on Monday, Wednesday, Friday.    [provider]  Doxercalciferol  (HECTOROL  IV) Inject 3 mcg into the vein every Monday, Wednesday, and Friday with hemodialysis. Provided and administered by dialysis clinic on Monday, Wednesday, Friday.    [provider]  latanoprost  (XALATAN ) 0.005 % ophthalmic solution Place 1 drop into both eyes at bedtime. Patient not taking: Reported on 12/26/2023    [provider]  Methoxy PEG-Epoetin  Beta (MIRCERA IJ) Inject 150 mcg into the vein every 14 (fourteen) days. Provided and administered by dialysis clinic every 14 days.    [provider]  sevelamer  carbonate (RENVELA ) 800 MG tablet Take 3,200 mg by mouth 3 (three) times daily with meals. Patient not taking: Reported on 12/26/2023    [provider]  Tiotropium Bromide-Olodaterol (STIOLTO RESPIMAT ) 2.5-2.5 MCG/ACT AERS Inhale 2 puffs into the lungs daily. Patient not taking: Reported on 12/26/2023 08/27/23   Howell Lunger, DO    Scheduled Meds:  acetaminophen   500 mg Oral Q6H   amLODipine   10 mg Oral QHS   arformoterol   15 mcg Nebulization BID   And   umeclidinium bromide   1 puff Inhalation Daily   Chlorhexidine  Gluconate Cloth  6 each Topical Daily   Chlorhexidine  Gluconate Cloth  6 each Topical Q0600   cinacalcet   150 mg Oral Once per day on Sunday Tuesday Friday   darbepoetin (ARANESP ) injection -  DIALYSIS  150 mcg Subcutaneous Q Sun-1800   darbepoetin (ARANESP ) injection - DIALYSIS  150 mcg  Subcutaneous Once   doxercalciferol   4 mcg Intravenous Once per day on Sunday Tuesday Friday   ferric citrate   630 mg Oral TID WC   isosorbide  dinitrate  20 mg Oral BID   loratadine   10 mg Oral Daily   pantoprazole   40 mg Oral Daily   polyethylene glycol  17 g Oral Daily   rosuvastatin   10 mg Oral Daily   senna  2 tablet Oral Daily   Continuous Infusions:  PRN Meds: albuterol , cinacalcet , doxercalciferol , HYDROmorphone  (DILAUDID ) injection **OR** [DISCONTINUED]  HYDROmorphone  (DILAUDID ) injection, iohexol , ondansetron , mouth rinse, oxyCODONE , simethicone   Allergies:    Allergies  Allergen Reactions   Venofer   [Ferric Oxide] Itching   Aspirin  Other (See Comments)    Avoids due to renal disease;  Made congestion worse     Social History:   Social History   Socioeconomic History   Marital status: Single    Spouse name: Not on file   Number of children: 0   Years of education: 12th   Highest education level: High school graduate  Occupational History   Occupation: n/a  Tobacco Use   Smoking status: Former    Current packs/day: 0.50    Average packs/day: 0.5 packs/day for 17.0 years (8.5 ttl pk-yrs)    Types: Cigarettes   Smokeless tobacco: Never   Tobacco comments:    08/27/23 quit 4 weeks ago  Vaping Use   Vaping status: Never Used  Substance and Sexual Activity   Alcohol use: Yes    Comment: Holidays has one drink   Drug use: Yes    Frequency: 4.0 times per week    Types: Marijuana    Comment: 06-06-2021 per pt smokes 2-4 weekly, (half of joint blunt)   Sexual activity: Not Currently  Other Topics Concern   Not on file  Social History Narrative   Patient lives alone in [place].    Patient has never been married and does not have any children.    Patients support system, his mother, pass away 2016.   Patient does not own his own vehicle, uses public transportation with no concerns at this time.   Social Drivers of Corporate Investment Banker Strain: Low  Risk  (08/27/2023)   Overall Financial Resource Strain (CARDIA)    Difficulty of Paying Living Expenses: Not very hard  Food Insecurity: No Food Insecurity (12/25/2023)   Hunger Vital Sign    Worried About Running Out of Food in the Last Year: Never true    Ran Out of Food in the Last Year: Never true  Transportation Needs: No Transportation Needs (12/25/2023)   PRAPARE - Administrator, Civil Service (Medical): No    Lack of Transportation (Non-Medical): No  Physical Activity: Inactive (08/27/2023)   Exercise Vital Sign    Days of Exercise per Week: 0 days    Minutes of Exercise per Session: 0 min  Stress: No Stress Concern Present (08/05/2022)   [employer] of Occupational Health - Occupational Stress Questionnaire    Feeling of Stress : Not at all  Social Connections: Moderately Isolated (08/27/2023)   Social Connection and Isolation Panel    Frequency of Communication with Friends and Family: Once a week    Frequency of Social Gatherings with Friends and Family: More than three times a week    Attends Religious Services: 1 to 4 times per year  Active Member of Clubs or Organizations: No    Attends Banker Meetings: Never    Marital Status: Never married  Intimate Partner Violence: Not At Risk (12/25/2023)   Humiliation, Afraid, Rape, and Kick questionnaire    Fear of Current or Ex-Partner: No    Emotionally Abused: No    Physically Abused: No    Sexually Abused: No    Family History:    Family History  Problem Relation Age of Onset   Hypertension Mother    Cerebrovascular Accident Father    Hypertension Father    Congestive Heart Failure Brother    Asthma Brother    Stomach cancer Neg Hx    Rectal cancer Neg Hx    Esophageal cancer Neg Hx    Colon cancer Neg Hx      ROS:  Please see the history of present illness.   All other ROS reviewed and negative.     Physical Exam/Data: Vitals:   12/28/23 0829 12/28/23 0917 12/28/23 1141  12/28/23 1353  BP: (!) 172/82 (!) 172/84 (!) 159/79 (!) 162/90  Pulse:  (!) 54 60 60  Resp: 15   (!) 9  Temp: 98.8 F (37.1 C)  98.7 F (37.1 C)   TempSrc: Oral  Oral   SpO2:   100% 94%  Weight:      Height:        Intake/Output Summary (Last 24 hours) at 12/28/2023 1443 Last data filed at 12/28/2023 1242 Gross per 24 hour  Intake 490 ml  Output --  Net 490 ml      12/28/2023    3:50 AM 12/27/2023   11:16 AM 12/27/2023    7:23 AM  Last 3 Weights  Weight (lbs) 160 lb 0.9 oz 173 lb 8 oz 180 lb 8.9 oz  Weight (kg) 72.6 kg 78.7 kg 81.9 kg     Body mass index is 26.63 kg/m.  General:  Well nourished, well developed, in no acute distress HEENT: normal Neck: Positive JVD to jaw Vascular: No carotid bruits; Distal pulses 2+ bilaterally Cardiac:  normal S1, S2; RRR; no murmur  Lungs:  clear to auscultation bilaterally, no wheezing, rhonchi or rales  Abd: Abdomen tender to palpation.  Left more tender than right. Ext: 1+ bilateral lower extremity edema Musculoskeletal:  No deformities Skin: warm and dry  Neuro:  no focal abnormalities noted Psych:  Normal affect   EKG:  The EKG was personally reviewed and demonstrates:  normal sinus rhythm with a rate of 63, first-degree AV block, LVH, inferior lateral T wave inversions, QT prolongation with a QTcB of 485.  T wave inversions were present on prior EKG. Telemetry:  Telemetry was personally reviewed and demonstrates: Normal sinus rhythm with heart rates in the 40s to 60s, has first-degree AV block, had a pause of 3.57 seconds today at 1456 (2 nonconducted P waves), and 5.26 seconds at 9:05 AM (3 nonconducted P waves).  His PR interval lengthens prior to having these episodes.  Relevant CV Studies: Echo pending  Laboratory Data: High Sensitivity Troponin:  No results for input(s): TROPONINIHS in the last 720 hours.   Chemistry Recent Labs  Lab 12/26/23 0237 12/27/23 0124 12/28/23 0059  NA 137 137 134*  K 4.0 4.6 3.9   CL 95* 96* 95*  CO2 29 27 26   GLUCOSE 93 82 95  BUN 23* 31* 21*  CREATININE 7.57* 9.09* 6.84*  CALCIUM  8.5* 8.0* 7.9*  MG 2.1  --  2.0  GFRNONAA 8*  6* 9*  ANIONGAP 13 14 13     Recent Labs  Lab 12/25/23 0147 12/27/23 0124 12/28/23 0059  PROT 6.7  --   --   ALBUMIN  3.2* 2.7* 2.7*  AST 21  --   --   ALT 14  --   --   ALKPHOS 78  --   --   BILITOT 1.3*  --   --    Lipids  Recent Labs  Lab 12/24/23 1448  CHOL 142  TRIG 70  HDL 67  LABVLDL 14  LDLCALC 61  CHOLHDL 2.1    Hematology Recent Labs  Lab 12/25/23 1353 12/26/23 0237 12/26/23 0652 12/28/23 0059 12/28/23 0551 12/28/23 1320  WBC 6.6 5.9  --   --  6.5  --   RBC 2.30* 1.90*  --   --  2.58*  --   HGB 7.5* 6.1*   < > 8.1* 8.2* 9.4*  HCT 23.4* 19.4*   < > 25.0* 24.6* 28.3*  MCV 101.7* 102.1*  --   --  95.3  --   MCH 32.6 32.1  --   --  31.8  --   MCHC 32.1 31.4  --   --  33.3  --   RDW 17.3* 17.5*  --   --  20.8*  --   PLT 133* 136*  --   --  141*  --    < > = values in this interval not displayed.   Thyroid  No results for input(s): TSH, FREET4 in the last 168 hours.  BNPNo results for input(s): BNP, PROBNP in the last 168 hours.  DDimer No results for input(s): DDIMER in the last 168 hours.  Radiology/Studies:  DG Chest 1 View Result Date: 12/25/2023 EXAM: 1 VIEW(S) XRAY OF THE CHEST 12/25/2023 08:28:00 AM COMPARISON: 08/20/2023 CLINICAL HISTORY: Central line complication FINDINGS: LINES, TUBES AND DEVICES: Right IJ hemodialysis catheter terminates at superior cavoatrial junction. LUNGS AND PLEURA: Presumed artifact overlying the right lower lobe with metallic density in the projection of the medial aspect of the right lung base, new from previous exam. Mild pulmonary edema. New small pleural effusion with worsening aeration to the left lower lung possibly representing atelectasis or airspace disease. Underlying aspiration not excluded. No pneumothorax. HEART AND MEDIASTINUM: Stable cardiomegaly.  Aortic atherosclerosis. BONES AND SOFT TISSUES: No acute osseous abnormality. IMPRESSION: 1. New small left pleural effusion with worsening left basilar aeration, which may reflect atelectasis or airspace disease; aspiration remains a consideration. 2. Mild pulmonary edema. 3. Artifact overlying the right lower lobe , new from previous exam. Electronically signed by: Waddell Calk MD 12/25/2023 01:00 PM EST RP Workstation: [id number]   IR EMBO ART  VEN HEMORR LYMPH EXTRAV  INC GUIDE ROADMAPPING Result Date: 12/25/2023 INDICATION: Spontaneous large left renal hemorrhage with prominent active extravasation of contrast from the peripheral left kidney by CT at the level of a known hyperdense renal lesion. The patient presents for arteriography with planned embolization the active renal hemorrhage. EXAM: 1. ULTRASOUND GUIDANCE FOR VASCULAR ACCESS OF THE RIGHT COMMON FEMORAL ARTERY 2. SELECTIVE ARTERIOGRAPHY OF THE LEFT RENAL ARTERY 3. ADDITIONAL SELECTIVE ARTERIOGRAPHY OF UPPER POLE THIRD ORDER SELECTIVE BRANCH VESSEL OF LEFT RENAL ARTERY 4. ADDITIONAL SELECTIVE ARTERIOGRAPHY OF DISTAL LEFT RENAL ARTERY 5. ADDITIONAL SELECTIVE ARTERIOGRAPHY OF ADDITIONAL THIRD ORDER SEGMENTAL LEFT RENAL ARTERY BRANCH 6. TRANSCATHETER EMBOLIZATION OF LEFT RENAL ARTERY TO TREAT ARTERIAL HEMORRHAGE MEDICATIONS: None ANESTHESIA/SEDATION: Moderate (conscious) sedation was employed during this procedure. A total of Versed  2.5 mg and Fentanyl  125 mcg  was administered intravenously. Moderate Sedation Time: 50 minutes. The patient's level of consciousness and vital signs were monitored continuously by radiology nursing throughout the procedure under my direct supervision. CONTRAST:  60 mL Omnipaque  300 FLUOROSCOPY TIME:  Radiation Exposure Index (as provided by the fluoroscopic device): 1,024 mGy Kerma COMPLICATIONS: None immediate. PROCEDURE: Informed consent was obtained from the patient's sister following explanation of the procedure,  risks, benefits and alternatives. The patient could not consent due to relative level of sedation at the time of assessment due to administration pain medication. The patient's sister understands, agrees and consents for the procedure. All questions were addressed. A time out was performed prior to the initiation of the procedure. Maximal barrier sterile technique utilized including caps, mask, sterile gowns, sterile gloves, large sterile drape, hand hygiene, and chlorhexidine  prep. Ultrasound was used to confirm patency of the right common femoral artery. An ultrasound image was saved and recorded. Under direct ultrasound guidance, access of right common femoral artery was performed with a micropuncture set. A 5 French sheath was placed over a guidewire. A 5 French Cobra catheter was advanced into the abdominal aorta and used to selectively catheterize the origin of the left renal artery. Selective left renal arteriography was performed. Attempt was made to advance the 5 French catheter into the main left renal artery. A coaxial 2.4 French microcatheter was advanced through the 5 French catheter into a third order left renal artery branch and selective renal arteriography performed. The microcatheter was withdrawn slightly and advanced into the distal aspect of the main left renal artery. Additional selective arteriography was performed. The microcatheter was then used to selectively catheterize a superior segmental branch of the left renal artery and additional selective arteriography performed. Embolization was begun within the superior segmental branch through the microcatheter with deployment of 0.018 inch embolization coils. During embolization, periodic injection of contrast and arteriography was performed through the microcatheter. Additional coil embolization was carried out proximally into the distal aspect of the main left renal artery. Completion arteriography was performed through the microcatheter.  Hemostasis was obtained at the right femoral access site with the Angio-Seal device after sheath removal. FINDINGS: Left renal arteriography demonstrates irregularity of the proximal left renal artery due to atherosclerosis without significant stenosis. There is active contrast extravasation from a mid to lower peripheral branch vessel near the renal capsule with pooling of extravasated contrast. This extravasation is very focal with no definable true tumor vascularity identified by angiography. The 5 French catheter could not be advanced into the main renal artery due to irregular atherosclerotic plaque. A microcatheter was able to be advanced initially into a subsegmental branch vessel which did not supply the region of active contrast extravasation. The microcatheter was withdrawn and additional arteriography demonstrates the region of contrast extravasation supplied mainly by a branch of a superior and likely anterior segmental artery. This artery was able to be catheterized. The microcatheter could not be advanced all the way to the level of peripheral extravasation and coil embolization was begun in the superior segmental artery. Embolization was continued back into the distal aspect of the main left renal artery in order to achieve adequate hemostasis. Completion arteriography demonstrates no further contrast extravasation. There is some minimal preserved flow to predominantly the lower pole of the left kidney. IMPRESSION: 1. Left renal arteriography demonstrates active contrast extravasation from a focal peripheral mid to lower pole renal artery branch. This is not associated with clear tumor vascularity and most likely etiology of active bleeding is felt  to be rupture of a hemorrhagic cyst with associated arterial bleeding. In order to exclude an underlying tumor, recommend follow-up MRI with and without contrast at a later time once the perinephric hematoma has resolved. 2. Successful embolization of  active bleeding from the left renal artery with embolization performed at the level of a superior segmental branch of the left renal artery extending into the distal main left renal artery. No further contrast extravasation was identified after coil embolization. Electronically Signed   By: Marcey Moan M.D.   On: 12/25/2023 09:10   IR US  Guide Vasc Access Right Result Date: 12/25/2023 INDICATION: Spontaneous large left renal hemorrhage with prominent active extravasation of contrast from the peripheral left kidney by CT at the level of a known hyperdense renal lesion. The patient presents for arteriography with planned embolization the active renal hemorrhage. EXAM: 1. ULTRASOUND GUIDANCE FOR VASCULAR ACCESS OF THE RIGHT COMMON FEMORAL ARTERY 2. SELECTIVE ARTERIOGRAPHY OF THE LEFT RENAL ARTERY 3. ADDITIONAL SELECTIVE ARTERIOGRAPHY OF UPPER POLE THIRD ORDER SELECTIVE BRANCH VESSEL OF LEFT RENAL ARTERY 4. ADDITIONAL SELECTIVE ARTERIOGRAPHY OF DISTAL LEFT RENAL ARTERY 5. ADDITIONAL SELECTIVE ARTERIOGRAPHY OF ADDITIONAL THIRD ORDER SEGMENTAL LEFT RENAL ARTERY BRANCH 6. TRANSCATHETER EMBOLIZATION OF LEFT RENAL ARTERY TO TREAT ARTERIAL HEMORRHAGE MEDICATIONS: None ANESTHESIA/SEDATION: Moderate (conscious) sedation was employed during this procedure. A total of Versed  2.5 mg and Fentanyl  125 mcg was administered intravenously. Moderate Sedation Time: 50 minutes. The patient's level of consciousness and vital signs were monitored continuously by radiology nursing throughout the procedure under my direct supervision. CONTRAST:  60 mL Omnipaque  300 FLUOROSCOPY TIME:  Radiation Exposure Index (as provided by the fluoroscopic device): 1,024 mGy Kerma COMPLICATIONS: None immediate. PROCEDURE: Informed consent was obtained from the patient's sister following explanation of the procedure, risks, benefits and alternatives. The patient could not consent due to relative level of sedation at the time of assessment due to  administration pain medication. The patient's sister understands, agrees and consents for the procedure. All questions were addressed. A time out was performed prior to the initiation of the procedure. Maximal barrier sterile technique utilized including caps, mask, sterile gowns, sterile gloves, large sterile drape, hand hygiene, and chlorhexidine  prep. Ultrasound was used to confirm patency of the right common femoral artery. An ultrasound image was saved and recorded. Under direct ultrasound guidance, access of right common femoral artery was performed with a micropuncture set. A 5 French sheath was placed over a guidewire. A 5 French Cobra catheter was advanced into the abdominal aorta and used to selectively catheterize the origin of the left renal artery. Selective left renal arteriography was performed. Attempt was made to advance the 5 French catheter into the main left renal artery. A coaxial 2.4 French microcatheter was advanced through the 5 French catheter into a third order left renal artery branch and selective renal arteriography performed. The microcatheter was withdrawn slightly and advanced into the distal aspect of the main left renal artery. Additional selective arteriography was performed. The microcatheter was then used to selectively catheterize a superior segmental branch of the left renal artery and additional selective arteriography performed. Embolization was begun within the superior segmental branch through the microcatheter with deployment of 0.018 inch embolization coils. During embolization, periodic injection of contrast and arteriography was performed through the microcatheter. Additional coil embolization was carried out proximally into the distal aspect of the main left renal artery. Completion arteriography was performed through the microcatheter. Hemostasis was obtained at the right femoral access site with the Angio-Seal device  after sheath removal. FINDINGS: Left renal  arteriography demonstrates irregularity of the proximal left renal artery due to atherosclerosis without significant stenosis. There is active contrast extravasation from a mid to lower peripheral branch vessel near the renal capsule with pooling of extravasated contrast. This extravasation is very focal with no definable true tumor vascularity identified by angiography. The 5 French catheter could not be advanced into the main renal artery due to irregular atherosclerotic plaque. A microcatheter was able to be advanced initially into a subsegmental branch vessel which did not supply the region of active contrast extravasation. The microcatheter was withdrawn and additional arteriography demonstrates the region of contrast extravasation supplied mainly by a branch of a superior and likely anterior segmental artery. This artery was able to be catheterized. The microcatheter could not be advanced all the way to the level of peripheral extravasation and coil embolization was begun in the superior segmental artery. Embolization was continued back into the distal aspect of the main left renal artery in order to achieve adequate hemostasis. Completion arteriography demonstrates no further contrast extravasation. There is some minimal preserved flow to predominantly the lower pole of the left kidney. IMPRESSION: 1. Left renal arteriography demonstrates active contrast extravasation from a focal peripheral mid to lower pole renal artery branch. This is not associated with clear tumor vascularity and most likely etiology of active bleeding is felt to be rupture of a hemorrhagic cyst with associated arterial bleeding. In order to exclude an underlying tumor, recommend follow-up MRI with and without contrast at a later time once the perinephric hematoma has resolved. 2. Successful embolization of active bleeding from the left renal artery with embolization performed at the level of a superior segmental branch of the left renal  artery extending into the distal main left renal artery. No further contrast extravasation was identified after coil embolization. Electronically Signed   By: Marcey Moan M.D.   On: 12/25/2023 09:10   IR Angiogram Renal Left Selective Result Date: 12/25/2023 INDICATION: Spontaneous large left renal hemorrhage with prominent active extravasation of contrast from the peripheral left kidney by CT at the level of a known hyperdense renal lesion. The patient presents for arteriography with planned embolization the active renal hemorrhage. EXAM: 1. ULTRASOUND GUIDANCE FOR VASCULAR ACCESS OF THE RIGHT COMMON FEMORAL ARTERY 2. SELECTIVE ARTERIOGRAPHY OF THE LEFT RENAL ARTERY 3. ADDITIONAL SELECTIVE ARTERIOGRAPHY OF UPPER POLE THIRD ORDER SELECTIVE BRANCH VESSEL OF LEFT RENAL ARTERY 4. ADDITIONAL SELECTIVE ARTERIOGRAPHY OF DISTAL LEFT RENAL ARTERY 5. ADDITIONAL SELECTIVE ARTERIOGRAPHY OF ADDITIONAL THIRD ORDER SEGMENTAL LEFT RENAL ARTERY BRANCH 6. TRANSCATHETER EMBOLIZATION OF LEFT RENAL ARTERY TO TREAT ARTERIAL HEMORRHAGE MEDICATIONS: None ANESTHESIA/SEDATION: Moderate (conscious) sedation was employed during this procedure. A total of Versed  2.5 mg and Fentanyl  125 mcg was administered intravenously. Moderate Sedation Time: 50 minutes. The patient's level of consciousness and vital signs were monitored continuously by radiology nursing throughout the procedure under my direct supervision. CONTRAST:  60 mL Omnipaque  300 FLUOROSCOPY TIME:  Radiation Exposure Index (as provided by the fluoroscopic device): 1,024 mGy Kerma COMPLICATIONS: None immediate. PROCEDURE: Informed consent was obtained from the patient's sister following explanation of the procedure, risks, benefits and alternatives. The patient could not consent due to relative level of sedation at the time of assessment due to administration pain medication. The patient's sister understands, agrees and consents for the procedure. All questions were addressed.  A time out was performed prior to the initiation of the procedure. Maximal barrier sterile technique utilized including caps, mask, sterile gowns,  sterile gloves, large sterile drape, hand hygiene, and chlorhexidine  prep. Ultrasound was used to confirm patency of the right common femoral artery. An ultrasound image was saved and recorded. Under direct ultrasound guidance, access of right common femoral artery was performed with a micropuncture set. A 5 French sheath was placed over a guidewire. A 5 French Cobra catheter was advanced into the abdominal aorta and used to selectively catheterize the origin of the left renal artery. Selective left renal arteriography was performed. Attempt was made to advance the 5 French catheter into the main left renal artery. A coaxial 2.4 French microcatheter was advanced through the 5 French catheter into a third order left renal artery branch and selective renal arteriography performed. The microcatheter was withdrawn slightly and advanced into the distal aspect of the main left renal artery. Additional selective arteriography was performed. The microcatheter was then used to selectively catheterize a superior segmental branch of the left renal artery and additional selective arteriography performed. Embolization was begun within the superior segmental branch through the microcatheter with deployment of 0.018 inch embolization coils. During embolization, periodic injection of contrast and arteriography was performed through the microcatheter. Additional coil embolization was carried out proximally into the distal aspect of the main left renal artery. Completion arteriography was performed through the microcatheter. Hemostasis was obtained at the right femoral access site with the Angio-Seal device after sheath removal. FINDINGS: Left renal arteriography demonstrates irregularity of the proximal left renal artery due to atherosclerosis without significant stenosis. There is active  contrast extravasation from a mid to lower peripheral branch vessel near the renal capsule with pooling of extravasated contrast. This extravasation is very focal with no definable true tumor vascularity identified by angiography. The 5 French catheter could not be advanced into the main renal artery due to irregular atherosclerotic plaque. A microcatheter was able to be advanced initially into a subsegmental branch vessel which did not supply the region of active contrast extravasation. The microcatheter was withdrawn and additional arteriography demonstrates the region of contrast extravasation supplied mainly by a branch of a superior and likely anterior segmental artery. This artery was able to be catheterized. The microcatheter could not be advanced all the way to the level of peripheral extravasation and coil embolization was begun in the superior segmental artery. Embolization was continued back into the distal aspect of the main left renal artery in order to achieve adequate hemostasis. Completion arteriography demonstrates no further contrast extravasation. There is some minimal preserved flow to predominantly the lower pole of the left kidney. IMPRESSION: 1. Left renal arteriography demonstrates active contrast extravasation from a focal peripheral mid to lower pole renal artery branch. This is not associated with clear tumor vascularity and most likely etiology of active bleeding is felt to be rupture of a hemorrhagic cyst with associated arterial bleeding. In order to exclude an underlying tumor, recommend follow-up MRI with and without contrast at a later time once the perinephric hematoma has resolved. 2. Successful embolization of active bleeding from the left renal artery with embolization performed at the level of a superior segmental branch of the left renal artery extending into the distal main left renal artery. No further contrast extravasation was identified after coil embolization.  Electronically Signed   By: Marcey Moan M.D.   On: 12/25/2023 09:10   IR Angiogram Selective Each Additional Vessel Result Date: 12/25/2023 INDICATION: Spontaneous large left renal hemorrhage with prominent active extravasation of contrast from the peripheral left kidney by CT at the level of a  known hyperdense renal lesion. The patient presents for arteriography with planned embolization the active renal hemorrhage. EXAM: 1. ULTRASOUND GUIDANCE FOR VASCULAR ACCESS OF THE RIGHT COMMON FEMORAL ARTERY 2. SELECTIVE ARTERIOGRAPHY OF THE LEFT RENAL ARTERY 3. ADDITIONAL SELECTIVE ARTERIOGRAPHY OF UPPER POLE THIRD ORDER SELECTIVE BRANCH VESSEL OF LEFT RENAL ARTERY 4. ADDITIONAL SELECTIVE ARTERIOGRAPHY OF DISTAL LEFT RENAL ARTERY 5. ADDITIONAL SELECTIVE ARTERIOGRAPHY OF ADDITIONAL THIRD ORDER SEGMENTAL LEFT RENAL ARTERY BRANCH 6. TRANSCATHETER EMBOLIZATION OF LEFT RENAL ARTERY TO TREAT ARTERIAL HEMORRHAGE MEDICATIONS: None ANESTHESIA/SEDATION: Moderate (conscious) sedation was employed during this procedure. A total of Versed  2.5 mg and Fentanyl  125 mcg was administered intravenously. Moderate Sedation Time: 50 minutes. The patient's level of consciousness and vital signs were monitored continuously by radiology nursing throughout the procedure under my direct supervision. CONTRAST:  60 mL Omnipaque  300 FLUOROSCOPY TIME:  Radiation Exposure Index (as provided by the fluoroscopic device): 1,024 mGy Kerma COMPLICATIONS: None immediate. PROCEDURE: Informed consent was obtained from the patient's sister following explanation of the procedure, risks, benefits and alternatives. The patient could not consent due to relative level of sedation at the time of assessment due to administration pain medication. The patient's sister understands, agrees and consents for the procedure. All questions were addressed. A time out was performed prior to the initiation of the procedure. Maximal barrier sterile technique utilized  including caps, mask, sterile gowns, sterile gloves, large sterile drape, hand hygiene, and chlorhexidine  prep. Ultrasound was used to confirm patency of the right common femoral artery. An ultrasound image was saved and recorded. Under direct ultrasound guidance, access of right common femoral artery was performed with a micropuncture set. A 5 French sheath was placed over a guidewire. A 5 French Cobra catheter was advanced into the abdominal aorta and used to selectively catheterize the origin of the left renal artery. Selective left renal arteriography was performed. Attempt was made to advance the 5 French catheter into the main left renal artery. A coaxial 2.4 French microcatheter was advanced through the 5 French catheter into a third order left renal artery branch and selective renal arteriography performed. The microcatheter was withdrawn slightly and advanced into the distal aspect of the main left renal artery. Additional selective arteriography was performed. The microcatheter was then used to selectively catheterize a superior segmental branch of the left renal artery and additional selective arteriography performed. Embolization was begun within the superior segmental branch through the microcatheter with deployment of 0.018 inch embolization coils. During embolization, periodic injection of contrast and arteriography was performed through the microcatheter. Additional coil embolization was carried out proximally into the distal aspect of the main left renal artery. Completion arteriography was performed through the microcatheter. Hemostasis was obtained at the right femoral access site with the Angio-Seal device after sheath removal. FINDINGS: Left renal arteriography demonstrates irregularity of the proximal left renal artery due to atherosclerosis without significant stenosis. There is active contrast extravasation from a mid to lower peripheral branch vessel near the renal capsule with pooling of  extravasated contrast. This extravasation is very focal with no definable true tumor vascularity identified by angiography. The 5 French catheter could not be advanced into the main renal artery due to irregular atherosclerotic plaque. A microcatheter was able to be advanced initially into a subsegmental branch vessel which did not supply the region of active contrast extravasation. The microcatheter was withdrawn and additional arteriography demonstrates the region of contrast extravasation supplied mainly by a branch of a superior and likely anterior segmental artery. This artery was able  to be catheterized. The microcatheter could not be advanced all the way to the level of peripheral extravasation and coil embolization was begun in the superior segmental artery. Embolization was continued back into the distal aspect of the main left renal artery in order to achieve adequate hemostasis. Completion arteriography demonstrates no further contrast extravasation. There is some minimal preserved flow to predominantly the lower pole of the left kidney. IMPRESSION: 1. Left renal arteriography demonstrates active contrast extravasation from a focal peripheral mid to lower pole renal artery branch. This is not associated with clear tumor vascularity and most likely etiology of active bleeding is felt to be rupture of a hemorrhagic cyst with associated arterial bleeding. In order to exclude an underlying tumor, recommend follow-up MRI with and without contrast at a later time once the perinephric hematoma has resolved. 2. Successful embolization of active bleeding from the left renal artery with embolization performed at the level of a superior segmental branch of the left renal artery extending into the distal main left renal artery. No further contrast extravasation was identified after coil embolization. Electronically Signed   By: Marcey Moan M.D.   On: 12/25/2023 09:10   IR Angiogram Selective Each Additional  Vessel Result Date: 12/25/2023 INDICATION: Spontaneous large left renal hemorrhage with prominent active extravasation of contrast from the peripheral left kidney by CT at the level of a known hyperdense renal lesion. The patient presents for arteriography with planned embolization the active renal hemorrhage. EXAM: 1. ULTRASOUND GUIDANCE FOR VASCULAR ACCESS OF THE RIGHT COMMON FEMORAL ARTERY 2. SELECTIVE ARTERIOGRAPHY OF THE LEFT RENAL ARTERY 3. ADDITIONAL SELECTIVE ARTERIOGRAPHY OF UPPER POLE THIRD ORDER SELECTIVE BRANCH VESSEL OF LEFT RENAL ARTERY 4. ADDITIONAL SELECTIVE ARTERIOGRAPHY OF DISTAL LEFT RENAL ARTERY 5. ADDITIONAL SELECTIVE ARTERIOGRAPHY OF ADDITIONAL THIRD ORDER SEGMENTAL LEFT RENAL ARTERY BRANCH 6. TRANSCATHETER EMBOLIZATION OF LEFT RENAL ARTERY TO TREAT ARTERIAL HEMORRHAGE MEDICATIONS: None ANESTHESIA/SEDATION: Moderate (conscious) sedation was employed during this procedure. A total of Versed  2.5 mg and Fentanyl  125 mcg was administered intravenously. Moderate Sedation Time: 50 minutes. The patient's level of consciousness and vital signs were monitored continuously by radiology nursing throughout the procedure under my direct supervision. CONTRAST:  60 mL Omnipaque  300 FLUOROSCOPY TIME:  Radiation Exposure Index (as provided by the fluoroscopic device): 1,024 mGy Kerma COMPLICATIONS: None immediate. PROCEDURE: Informed consent was obtained from the patient's sister following explanation of the procedure, risks, benefits and alternatives. The patient could not consent due to relative level of sedation at the time of assessment due to administration pain medication. The patient's sister understands, agrees and consents for the procedure. All questions were addressed. A time out was performed prior to the initiation of the procedure. Maximal barrier sterile technique utilized including caps, mask, sterile gowns, sterile gloves, large sterile drape, hand hygiene, and chlorhexidine  prep. Ultrasound  was used to confirm patency of the right common femoral artery. An ultrasound image was saved and recorded. Under direct ultrasound guidance, access of right common femoral artery was performed with a micropuncture set. A 5 French sheath was placed over a guidewire. A 5 French Cobra catheter was advanced into the abdominal aorta and used to selectively catheterize the origin of the left renal artery. Selective left renal arteriography was performed. Attempt was made to advance the 5 French catheter into the main left renal artery. A coaxial 2.4 French microcatheter was advanced through the 5 French catheter into a third order left renal artery branch and selective renal arteriography performed. The microcatheter was withdrawn slightly  and advanced into the distal aspect of the main left renal artery. Additional selective arteriography was performed. The microcatheter was then used to selectively catheterize a superior segmental branch of the left renal artery and additional selective arteriography performed. Embolization was begun within the superior segmental branch through the microcatheter with deployment of 0.018 inch embolization coils. During embolization, periodic injection of contrast and arteriography was performed through the microcatheter. Additional coil embolization was carried out proximally into the distal aspect of the main left renal artery. Completion arteriography was performed through the microcatheter. Hemostasis was obtained at the right femoral access site with the Angio-Seal device after sheath removal. FINDINGS: Left renal arteriography demonstrates irregularity of the proximal left renal artery due to atherosclerosis without significant stenosis. There is active contrast extravasation from a mid to lower peripheral branch vessel near the renal capsule with pooling of extravasated contrast. This extravasation is very focal with no definable true tumor vascularity identified by angiography.  The 5 French catheter could not be advanced into the main renal artery due to irregular atherosclerotic plaque. A microcatheter was able to be advanced initially into a subsegmental branch vessel which did not supply the region of active contrast extravasation. The microcatheter was withdrawn and additional arteriography demonstrates the region of contrast extravasation supplied mainly by a branch of a superior and likely anterior segmental artery. This artery was able to be catheterized. The microcatheter could not be advanced all the way to the level of peripheral extravasation and coil embolization was begun in the superior segmental artery. Embolization was continued back into the distal aspect of the main left renal artery in order to achieve adequate hemostasis. Completion arteriography demonstrates no further contrast extravasation. There is some minimal preserved flow to predominantly the lower pole of the left kidney. IMPRESSION: 1. Left renal arteriography demonstrates active contrast extravasation from a focal peripheral mid to lower pole renal artery branch. This is not associated with clear tumor vascularity and most likely etiology of active bleeding is felt to be rupture of a hemorrhagic cyst with associated arterial bleeding. In order to exclude an underlying tumor, recommend follow-up MRI with and without contrast at a later time once the perinephric hematoma has resolved. 2. Successful embolization of active bleeding from the left renal artery with embolization performed at the level of a superior segmental branch of the left renal artery extending into the distal main left renal artery. No further contrast extravasation was identified after coil embolization. Electronically Signed   By: Marcey Moan M.D.   On: 12/25/2023 09:10   IR Angiogram Selective Each Additional Vessel Result Date: 12/25/2023 INDICATION: Spontaneous large left renal hemorrhage with prominent active extravasation of  contrast from the peripheral left kidney by CT at the level of a known hyperdense renal lesion. The patient presents for arteriography with planned embolization the active renal hemorrhage. EXAM: 1. ULTRASOUND GUIDANCE FOR VASCULAR ACCESS OF THE RIGHT COMMON FEMORAL ARTERY 2. SELECTIVE ARTERIOGRAPHY OF THE LEFT RENAL ARTERY 3. ADDITIONAL SELECTIVE ARTERIOGRAPHY OF UPPER POLE THIRD ORDER SELECTIVE BRANCH VESSEL OF LEFT RENAL ARTERY 4. ADDITIONAL SELECTIVE ARTERIOGRAPHY OF DISTAL LEFT RENAL ARTERY 5. ADDITIONAL SELECTIVE ARTERIOGRAPHY OF ADDITIONAL THIRD ORDER SEGMENTAL LEFT RENAL ARTERY BRANCH 6. TRANSCATHETER EMBOLIZATION OF LEFT RENAL ARTERY TO TREAT ARTERIAL HEMORRHAGE MEDICATIONS: None ANESTHESIA/SEDATION: Moderate (conscious) sedation was employed during this procedure. A total of Versed  2.5 mg and Fentanyl  125 mcg was administered intravenously. Moderate Sedation Time: 50 minutes. The patient's level of consciousness and vital signs were monitored continuously by radiology  nursing throughout the procedure under my direct supervision. CONTRAST:  60 mL Omnipaque  300 FLUOROSCOPY TIME:  Radiation Exposure Index (as provided by the fluoroscopic device): 1,024 mGy Kerma COMPLICATIONS: None immediate. PROCEDURE: Informed consent was obtained from the patient's sister following explanation of the procedure, risks, benefits and alternatives. The patient could not consent due to relative level of sedation at the time of assessment due to administration pain medication. The patient's sister understands, agrees and consents for the procedure. All questions were addressed. A time out was performed prior to the initiation of the procedure. Maximal barrier sterile technique utilized including caps, mask, sterile gowns, sterile gloves, large sterile drape, hand hygiene, and chlorhexidine  prep. Ultrasound was used to confirm patency of the right common femoral artery. An ultrasound image was saved and recorded. Under direct  ultrasound guidance, access of right common femoral artery was performed with a micropuncture set. A 5 French sheath was placed over a guidewire. A 5 French Cobra catheter was advanced into the abdominal aorta and used to selectively catheterize the origin of the left renal artery. Selective left renal arteriography was performed. Attempt was made to advance the 5 French catheter into the main left renal artery. A coaxial 2.4 French microcatheter was advanced through the 5 French catheter into a third order left renal artery branch and selective renal arteriography performed. The microcatheter was withdrawn slightly and advanced into the distal aspect of the main left renal artery. Additional selective arteriography was performed. The microcatheter was then used to selectively catheterize a superior segmental branch of the left renal artery and additional selective arteriography performed. Embolization was begun within the superior segmental branch through the microcatheter with deployment of 0.018 inch embolization coils. During embolization, periodic injection of contrast and arteriography was performed through the microcatheter. Additional coil embolization was carried out proximally into the distal aspect of the main left renal artery. Completion arteriography was performed through the microcatheter. Hemostasis was obtained at the right femoral access site with the Angio-Seal device after sheath removal. FINDINGS: Left renal arteriography demonstrates irregularity of the proximal left renal artery due to atherosclerosis without significant stenosis. There is active contrast extravasation from a mid to lower peripheral branch vessel near the renal capsule with pooling of extravasated contrast. This extravasation is very focal with no definable true tumor vascularity identified by angiography. The 5 French catheter could not be advanced into the main renal artery due to irregular atherosclerotic plaque. A  microcatheter was able to be advanced initially into a subsegmental branch vessel which did not supply the region of active contrast extravasation. The microcatheter was withdrawn and additional arteriography demonstrates the region of contrast extravasation supplied mainly by a branch of a superior and likely anterior segmental artery. This artery was able to be catheterized. The microcatheter could not be advanced all the way to the level of peripheral extravasation and coil embolization was begun in the superior segmental artery. Embolization was continued back into the distal aspect of the main left renal artery in order to achieve adequate hemostasis. Completion arteriography demonstrates no further contrast extravasation. There is some minimal preserved flow to predominantly the lower pole of the left kidney. IMPRESSION: 1. Left renal arteriography demonstrates active contrast extravasation from a focal peripheral mid to lower pole renal artery branch. This is not associated with clear tumor vascularity and most likely etiology of active bleeding is felt to be rupture of a hemorrhagic cyst with associated arterial bleeding. In order to exclude an underlying tumor, recommend follow-up MRI  with and without contrast at a later time once the perinephric hematoma has resolved. 2. Successful embolization of active bleeding from the left renal artery with embolization performed at the level of a superior segmental branch of the left renal artery extending into the distal main left renal artery. No further contrast extravasation was identified after coil embolization. Electronically Signed   By: Marcey Moan M.D.   On: 12/25/2023 09:10   CT ABDOMEN PELVIS W CONTRAST Result Date: 12/25/2023 EXAM: CT ABDOMEN AND PELVIS WITH CONTRAST 12/25/2023 03:44:33 AM TECHNIQUE: CT of the abdomen and pelvis was performed with the administration of 100 mL of iohexol  (OMNIPAQUE ) 350 MG/ML injection. Multiplanar reformatted  images are provided for review. Automated exposure control, iterative reconstruction, and/or weight-based adjustment of the mA/kV was utilized to reduce the radiation dose to as low as reasonably achievable. COMPARISON: Prior examination of 12/05/2023 and 10/19/2022. CLINICAL HISTORY: Abdominal pain, acute, nonlocalized. FINDINGS: LOWER CHEST: Extensive multivessel coronary artery calcifications. Mild cardiomegaly. Small hiatal hernia. HEPATOBILIARY: The liver is unremarkable. Gallbladder is unremarkable. No biliary ductal dilatation. SPLEEN: No acute abnormality. PANCREAS: No acute abnormality. ADRENAL GLANDS: No acute abnormality. KIDNEYS, URETERS AND BLADDER: The kidneys are markedly atrophic, similar to prior examination, in keeping with changes of end-stage renal disease. Innumerable simple cysts are seen within the kidneys bilaterally in keeping with probable cystic change of chronic renal failure. However, since the prior examination there has developed a large left perinephric hematoma with extensive hemorrhage within and extending into the retroperitoneum. 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 There is trace high attenuation material within the decompressed bladder lumen in keeping with a small amount of hemorrhagic fluid or debris. GI AND BOWEL: Large retroperitoneal/perinephric hematoma displaces the bowel from the left hemiabdomen. The stomach, small bowel, and large bowel are otherwise unremarkable. Appendix normal. PERITONEUM AND RETROPERITONEUM: Fluid is also seen tracking around the pancreas and duodenum superiorly. There is mild bland ascites, however, no evidence of hemoperitoneum. VASCULATURE: Extensive aortoiliac atherosclerotic calcifications. No aortic aneurysm. LYMPH NODES: No lymphadenopathy. REPRODUCTIVE ORGANS: Fiducial  markers are seen within the prostate gland. BONES AND SOFT TISSUES: The osseous structures are diffusely sclerotic in keeping with changes of renal osteodystrophy. No acute bone abnormality. No lytic or blastic bone lesion. Moderate diffuse subcutaneous edema in keeping with anasarca. IMPRESSION: 1. 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 Associated fluid tracks around the pancreas and duodenum. 2. Mild bland ascites. No hemoperitoneum. 3. Moderate diffuse subcutaneous edema consistent with anasarca. 4. Markedly atrophic kidneys with innumerable simple cysts, compatible with end-stage renal disease; unchanged from prior. 5. Extensive multivessel coronary artery calcifications and mild cardiomegaly. 6. Small hiatal hernia. I discussed these findings personally with Dr. Midge at 4:21 am Electronically signed by: Dorethia Molt MD 12/25/2023 04:26 AM EST RP Workstation: [id number]     Assessment and Plan: James Wall is a 58 y.o. male with a hx of tobacco use, COPD, hypertension, hyperlipidemia, cirrhosis, ESRD on HD, heart failure with improved LVEF, paroxysmal SVT, prior CVA on 09/2021, OSA, and prostate cancer initially diagnosed in 2023 who is being seen 12/28/2023 for the evaluation of bradycardia at the request of [employer].  left renal perinephritic bleed Patient presented to the emergency department for abdominal pain.  Abdominal CT showed left renal perinephritic  bleed.  IR did a left renal artery embolization procedure on 12/25/2023.    Intermittent AV node nonconduction. Telemetry showed patient had a pause of 3.57 seconds today at 1456 (2 nonconducted P waves), and 5.26 seconds at 9:05 AM (3 nonconducted P waves).  Because of this the patients metoprolol  was stopped. Patient was in bed and was likely sleeping during these episode.  He denied  any lightheadedness or dizziness. Ordered ZIO monitor. Stop metoprolol .  Will avoid AV nodal agents.   OSA Was previously diagnosed with OSA in 2009.  Reported that he wore his CPAP for about a year but has not worn it since.  Patient is willing to do a sleep study.   Recommend outpatient sleep study.   Coronary calcifications Hyperlipidemia CT abdomen and pelvis also found extensive multivessel coronary artery calcifications.  Has had worsening dyspnea on exertion.  Has previously refused cardiac catheterization multiple times due to wanting general anesthesia. EKG showed normal sinus rhythm with a rate of 63, first-degree AV block, LVH, inferior lateral T wave inversions, QT prolongation with a QTcB of 485.  T wave inversions were present on prior EKG. May consider increasing Crestor  to 20 mg daily.   Chronic diastolic heart failure Volume management per nephrology as has ESRD on HD. GDMT Limited by ESRD on HD and pauses the patient is having.   Hypertension Blood pressures have been slightly elevated. Continue amlodipine  10 mg daily Continue Isordil  20 mg twice daily Add hydralazine  25 mg 3 times daily.   Otherwise management per primary    Risk Assessment/Risk Scores:       [place]  Heart Association (NYHA) Functional Class NYHA Class III       For questions or updates, please contact [facility] Please consult [email or web address] for contact info under     Signed, Morse Clause, PA-C  12/28/2023 2:43 PM  Personally seen and examined. Agree with above.  58 year old male with end-stage renal disease on hemodialysis currently on metoprolol  50 mg twice a day for paroxysmal supraventricular tachycardia, ejection fraction 60%, COPD who earlier today at 9 AM had a 5.4-second pause.  Does not recall the experience.  He thinks he was asleep.  He has been diagnosed with sleep apnea over 10 years ago but has not worn a mask for many years.  He had a few other pauses  on the monitor as well in the 3-second range.  Upon close inspection of the pauses, there are P waves preceding throughout the pause with sinus bradycardia underlying.  His PR interval increases sequentially until pause occurs.  On exam, easily arousable, no acute distress, regular rate and rhythm, lungs are clear.  High degree AV block - Appears to be secondary to increased vagal tone which is likely a result of obstructive sleep apnea.  At this time, does not warrant pacemaker implantation.  Also, pacemaker implantation would be at high risk for infection given his underlying hemodialysis status. - For step is to discontinue his metoprolol  50 mg twice a day.  Hopefully this will help rectify the pauses. - It would not be unreasonable after this hospitalization to place another ZIO monitor to look for any signals of either further pauses or PSVT off of metoprolol . -Recommend outpatient sleep study.  Coronary artery disease - Coronary artery calcifications noted quite extensive multivessel.  At 1 point cardiac catheterization was suggested but he refused. -Consider increasing Crestor  to 20 mg a day.  Chronic diastolic heart failure - Volume management per nephrology/hemodialysis -We  have added hydralazine  25 mg 3 times a day to his Isordil  20 mg twice a day as well as amlodipine  10 mg a day.  Oneil Parchment, MD

## 2023-12-28 NOTE — Assessment & Plan Note (Signed)
 HFpEF: CTM volume status.  Medications as above. GERD: Protonix  40 mg daily  HLD: Crestor  10 mg daily  Secondary hyperparathyroidism: Sensipar  90 mg daily  COPD: home Stiolto Respimat , on formulary Brovana  neb BID and Incruse Ellipta 

## 2023-12-28 NOTE — Progress Notes (Signed)
 [facility] Progress Note   Subjective:   Reports he is feeling a little bit better, was able to eat some and passing gas. Denies SOB, CP< dizziness, nausea.   Objective Vitals:   12/27/23 2050 12/27/23 2300 12/28/23 0350 12/28/23 0829  BP: 139/85 128/83 (!) 166/71 (!) 172/82  Pulse: 60 (!) 59 (!) 59   Resp: 14 19 16 15   Temp: 98.6 F (37 C) 98.6 F (37 C) 98.9 F (37.2 C) 98.8 F (37.1 C)  TempSrc: Oral Oral Oral Oral  SpO2: 98% 91% 98%   Weight:   72.6 kg   Height:       Physical Exam General: Alert male in NAD Heart: RRR, no murmurs, rubs or gallops Lungs: CTA bilaterally, respirations unlabored on RA Abdomen: Soft, non-distended, +BS Extremities: no edema b/l lower extremities Dialysis Access:  [facility]  Additional Objective Labs: Basic Metabolic Panel: Recent Labs  Lab 12/26/23 0237 12/27/23 0124 12/28/23 0059  NA 137 137 134*  K 4.0 4.6 3.9  CL 95* 96* 95*  CO2 29 27 26   GLUCOSE 93 82 95  BUN 23* 31* 21*  CREATININE 7.57* 9.09* 6.84*  CALCIUM  8.5* 8.0* 7.9*  PHOS 5.2* 5.5* 3.4   Liver Function Tests: Recent Labs  Lab 12/25/23 0147 12/27/23 0124 12/28/23 0059  AST 21  --   --   ALT 14  --   --   ALKPHOS 78  --   --   BILITOT 1.3*  --   --   PROT 6.7  --   --   ALBUMIN  3.2* 2.7* 2.7*   Recent Labs  Lab 12/25/23 0147  LIPASE 33   CBC: Recent Labs  Lab 12/25/23 0147 12/25/23 0152 12/25/23 0837 12/25/23 1353 12/26/23 0237 12/26/23 0652 12/27/23 1912 12/28/23 0059 12/28/23 0551  WBC 6.7  --  6.5 6.6 5.9  --   --   --  6.5  HGB 9.6*   < > 7.4* 7.5* 6.1*   < > 8.7* 8.1* 8.2*  HCT 30.2*   < > 23.1* 23.4* 19.4*   < > 26.3* 25.0* 24.6*  MCV 101.3*  --  101.8* 101.7* 102.1*  --   --   --  95.3  PLT 176  --  149* 133* 136*  --   --   --  141*   < > = values in this interval not displayed.   Blood Culture    Component Value Date/Time   SDES BLOOD SITE NOT SPECIFIED 10/19/2022 1501   SPECREQUEST  10/19/2022 1501    BOTTLES  DRAWN AEROBIC AND ANAEROBIC Blood Culture results may not be optimal due to an excessive volume of blood received in culture bottles   CULT  10/19/2022 1501    NO GROWTH 5 DAYS Performed at [facility], [address] [place], [place] [address]    REPTSTATUS 10/24/2022 FINAL 10/19/2022 1501    Cardiac Enzymes: No results for input(s): CKTOTAL, CKMB, CKMBINDEX, TROPONINI in the last 168 hours. CBG: No results for input(s): GLUCAP in the last 168 hours. Iron  Studies: No results for input(s): IRON , TIBC, TRANSFERRIN, FERRITIN in the last 72 hours. @lablastinr3 @ Studies/Results: No results found. Medications:   acetaminophen   500 mg Oral Q6H   amLODipine   10 mg Oral QHS   arformoterol   15 mcg Nebulization BID   And   umeclidinium bromide   1 puff Inhalation Daily   Chlorhexidine  Gluconate Cloth  6 each Topical Daily   Chlorhexidine  Gluconate Cloth  6 each Topical Q0600   cinacalcet   150 mg Oral Once per day on Sunday Tuesday Friday   darbepoetin (ARANESP ) injection - DIALYSIS  150 mcg Subcutaneous Q Sun-1800   doxercalciferol   4 mcg Intravenous Once per day on Sunday Tuesday Friday   ferric citrate   630 mg Oral TID WC   isosorbide  dinitrate  20 mg Oral BID   loratadine   10 mg Oral Daily   metoprolol  tartrate  50 mg Oral BID   pantoprazole   40 mg Oral Daily   polyethylene glycol  17 g Oral Daily   rosuvastatin   10 mg Oral Daily   senna  2 tablet Oral Daily    Dialysis Orders:   [facility] M[facility] 4h  B400  86.7kg  2K bath  TDC  Heparin  4500 + 2000 midrun Last OP HD 11/19, post wt 90.8kg (+4kg) Mircera 150mcg q2wks - last 10/27 Hectorol  4mcg qHD Sensipar  120mg  qHD Usually doesn't get to dry wt, come off 1-6 kg over  Signs off early at 2.5- 3.5 hrs usually  Assessment/Plan: L renal/ perinephric bleed: w/ assoc RP hemorrhage. CTA showed extravasation and IR did L renal artery embolization procedure on 12/25/23. Pain better but ongoing. Pain managed by PMD.   ESRD: on HD MWF. Due to Holiday schedule will run on Sunday, Tuesday and Friday this week.  HD today.  HTN: elevated today, continue home meds and UF with HD as tolerated Volume: CXR 11/21 showed pulm edema.O2 requirement has improved, now on RA. 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  transfusing prn. ESA started Sunday Secondary hyperparathyroidism - Ca and phos in goal. Continue home meds. Nutrition - renal diet with fluid restrictions.     Lucie Collet, PA-C 12/28/2023, 8:51 AM  [facility] Pager: [phone]

## 2023-12-28 NOTE — Assessment & Plan Note (Addendum)
-   Continue home amlodipine  10 daily, isosorbide  20 twice daily - D/c metoprolol

## 2023-12-28 NOTE — Progress Notes (Signed)
 Chaplain responded to [facility] consult for prayer/major life transition. Chaplain introduced spiritual care and offered support in the setting of inpatient admission. Chaplain asked open ended questions to facilitate emotional expression and story telling. Mr Grahn shared that he has been feeling overwhelmed because of continued health challenges. He reports that whenever one issue begins to resolve another arises and he has been feeling grief for the life he longs to live. He shared about some isolation from his family and his church community as he is unable to sit up for too long. He also tearfully shared about the death of a companion from the dialysis clinic and his grief for that gentleman as well as his own fears about his kidney health. Chaplain normalized feelings of overwhelm and grief and acknowledged Mr [facility] appreciation for the life he does have and offered a reframe that two things can be true at the same time and Mr Cessna can experience grief and gratitude at the same time.  He expressed gratitude for our time together and prayed with chaplain.  He would appreciate continued support while he is here and is aware that [employer] remains available.   Please page as further needs arise.  Alan HERO. Davee Lomax, M.Div. [facility] Chaplain Pager [phone] Office [phone]      12/28/23 1553  Spiritual Encounters  Type of Visit Initial  Care provided to: Patient  Referral source Patient request  Reason for visit Routine spiritual support  OnCall Visit No  Spiritual Framework  Presenting Themes Goals in life/care;Values and beliefs;Significant life change;Caregiving needs;Coping tools;Courage hope and growth  Community/Connection Family;Faith community  Patient Stress Factors Loss of control;Health changes;Major life changes  Goals  Self/Personal Goals Feel well enough to be present at church  Interventions  Spiritual Care Interventions Made Established relationship of care and  support;Compassionate presence;Reflective listening;Normalization of emotions;Prayer  Intervention Outcomes  Outcomes Connection to spiritual care;Connection to values and goals of care;Reduced isolation

## 2023-12-28 NOTE — Assessment & Plan Note (Addendum)
 5.33 second pause this morning, metoprolol  held. Asymptomatic with chronic sinus brady. Metrop last given yesterday afternoon, d.c metoprolol  and monitor. If still continuing to pause, consult cardiology. On Tele. Consistent brady 50s-60s, same as previous hospitalization in August - EKG with events - Consult cardiology if another event - Monitor HR - RFP, Mag

## 2023-12-28 NOTE — Progress Notes (Signed)
 OT Cancellation Note  Patient Details Name: James Wall MRN: [id number] DOB: 1966/01/17   Cancelled Treatment:    Reason Eval/Treat Not Completed: Medical issues which prohibited therapy. Therapy to hold today per RN from MD. OT will follow up tomorrow as appropriate  [name] 12/28/2023, 12:31 PM

## 2023-12-28 NOTE — Progress Notes (Signed)
 PT Cancellation Note  Patient Details Name: James Wall MRN: [id number] DOB: 04/16/1965   Cancelled Treatment:    Reason Eval/Treat Not Completed: Medical issues which prohibited therapy today, per discussion with RN and OT given 5 second pause earlier today. Will continue to follow and evaluate as appropriate.   [name], PT, DPT   Acute Rehabilitation Department Office [phone] Secure Chat Communication Preferred   [name] 12/28/2023, 12:39 PM

## 2023-12-28 NOTE — Progress Notes (Signed)
 Pt had 5.33 second pause at rest. Pt asymptomatic, held metoprolol . Notified Damien Cassis, MD. Received order for EKG.

## 2023-12-28 NOTE — Assessment & Plan Note (Addendum)
 Completed  - Appreciate ongoing nephrology recommendations - HD per nephrology

## 2023-12-28 NOTE — Progress Notes (Signed)
 Daily Progress Note Intern Pager: [phone]  Patient name: James Wall [facility] Medical record number: [id number] Date of birth: 1965-04-06 Age: 58 y.o. Gender: male  Primary Care Provider: Janna Ferrier, DO Consultants: PCCM, IR, nephrology, VSS, urology Code Status: Full  Pt Overview and Major Events to Date:  11/21: Admitted to ICU, IR embolization 11/22: Transferred to FM TS, transfused 1 unit PRBC 11/23: HD  Assessment and Plan: James Wall is a 58 year old male with a PMHx of ESRD on HD, COPD, HTN, HFrEF, prostate cancer, possible RCC, cirrhosis, CVA admitted for ruptured left renal neoplasm with extensive retroperitoneal bleed s/p IR embolization of the left renal artery. Assessment & Plan Perinephric hematoma S/p L renal artery embolization 11/21. Transfusion threshold hemoglobin < 8. AM Hgb 8.2, order Hbg now to monitor. If drops again, consider give another unit and abdominal scan to look for bleed.  - Reorder H&H Q6 - Pain regimen: Tylenol  500 every 6, oxycodone  5 Q6 as needed, Dilaudid  1 every 4 as needed for breakthrough pain - Simethicone  as needed added for bloating/flatulence - Bowel regimen: MiraLAX  and senna daily - Continue Zofran  ODT as needed - Consider repeat CT if acute drop in Hgb Sinus pause Chronic sinus bradycardia 5.33 second pause this morning, metoprolol  held. Asymptomatic with chronic sinus brady. Metrop last given yesterday afternoon, d.c metoprolol  and monitor. If still continuing to pause, consult cardiology. On Tele. Consistent brady 50s-60s, same as previous hospitalization in August - EKG with events - Consult cardiology if another event - Monitor HR - RFP, Mag HTN (hypertension) - Continue home amlodipine  10 daily, isosorbide  20 twice daily - D/c metoprolol  ESRD (end stage renal disease) on dialysis (HCC) Completed  - Appreciate ongoing nephrology recommendations - HD per nephrology Chronic health problem HFpEF: CTM volume  status.  Medications as above. GERD: Protonix  40 mg daily  HLD: Crestor  10 mg daily  Secondary hyperparathyroidism: Sensipar  90 mg daily  COPD: home Stiolto Respimat , on formulary Brovana  neb BID and Incruse Ellipta    FEN/GI: Renal Diet with 1200 fluid restriction PPx: SCDs, heparin  with dialysis Dispo: Tele  Subjective:  Urology in room rounding. Had dialysis yesterday, feeling good, appetitie is increasing. Still has some mild residual abdominal pain and gassiness, but no complaints. Denies dizziness, fatigue, SOB.   Objective: Temp:  [98.6 F (37 C)-99.8 F (37.7 C)] 98.7 F (37.1 C) (11/24 1141) Pulse Rate:  [54-60] 60 (11/24 1353) Resp:  [9-19] 9 (11/24 1353) BP: (117-172)/(71-90) 162/90 (11/24 1353) SpO2:  [91 %-100 %] 94 % (11/24 1353) Weight:  [72.6 kg] 72.6 kg (11/24 0350)  Physical Exam: General: Well-appearing, no acute distress Cardio: Bradycardia, regular rhythm, no murmurs on exam. Pulm: Clear, no wheezing, no crackles. No increased work of breathing Abdominal: soft, slightly tender, non-distended Extremities: no peripheral edema    Laboratory: Most recent CBC Lab Results  Component Value Date   WBC 6.5 12/28/2023   HGB 9.4 (L) 12/28/2023   HCT 28.3 (L) 12/28/2023   MCV 95.3 12/28/2023   PLT 141 (L) 12/28/2023   Most recent BMP    Latest Ref Rng & Units 12/28/2023   12:59 AM  BMP  Glucose 70 - 99 mg/dL 95   BUN 6 - 20 mg/dL 21   Creatinine 9.38 - 1.24 mg/dL 3.15   Sodium 864 - 854 mmol/L 134   Potassium 3.5 - 5.1 mmol/L 3.9   Chloride 98 - 111 mmol/L 95   CO2 22 - 32 mmol/L 26   Calcium   8.9 - 10.3 mg/dL 7.9    James Palma, MD 12/28/2023, 2:11 PM  PGY-1, [facility] FPTS Intern pager: [phone], text pages welcome Secure chat group [facility] Teaching Service

## 2023-12-28 NOTE — Telephone Encounter (Signed)
 Pharmacy Patient Advocate Encounter  Insurance verification completed.    The patient is insured through [facility]. Patient has Medicare and is not eligible for a copay card, but may be able to apply for patient assistance or Medicare RX Payment Plan (Patient Must reach out to their plan, if eligible for payment plan), if available.    Ran test claim for Incruse Ellipta  62.55mcg and the current 30 day co-pay is $0.   This test claim was processed through [facility]- copay amounts may vary at other pharmacies due to [employer], or as the patient moves through the different stages of their insurance plan.

## 2023-12-28 NOTE — Assessment & Plan Note (Addendum)
 S/p L renal artery embolization 11/21. Transfusion threshold hemoglobin < 8. AM Hgb 8.2, order Hbg now to monitor. If drops again, consider give another unit and abdominal scan to look for bleed.  - Reorder H&H Q6 - Pain regimen: Tylenol  500 every 6, oxycodone  5 Q6 as needed, Dilaudid  1 every 4 as needed for breakthrough pain - Simethicone  as needed added for bloating/flatulence - Bowel regimen: MiraLAX  and senna daily - Continue Zofran  ODT as needed - Consider repeat CT if acute drop in Hgb

## 2023-12-28 NOTE — Progress Notes (Signed)
 Pt HR bradycardic in 50's, lowest seen at 47. Notified Damien Cassis, MD about HR. Received order to monitor.

## 2023-12-28 NOTE — Plan of Care (Signed)
 Received secure chat about severe abdominal pain. She had given him dilaudid  without significant relief.   Also noted to have runs of bradycardia into the 40s that is not sustained. He appears to be hemodynamically stable per RN.   At bedside, he does not appear to be in acute distress. However he is severely tender to touch on his abdomen.   Order STAT CT AP w Con.  Will consult cardiology to help with bradycardia and sinus pauses.   Damien Pinal, DO [facility], PGY-3 12/28/23 2:08 PM

## 2023-12-28 NOTE — Progress Notes (Signed)
 Pt receives out-pt HD at [facility] on MWF 5:40 am chair time. Will assist as needed.   Randine Mungo Dialysis Navigator [phone]

## 2023-12-28 NOTE — Progress Notes (Signed)
 Pt having extreme abdominal pain after getting back into bed. Pain is tender to the touch. HR is tachy/brady. Attending team made aware. Stat CT was ordered. MD arrived to bedside.

## 2023-12-29 ENCOUNTER — Inpatient Hospital Stay (HOSPITAL_COMMUNITY)

## 2023-12-29 DIAGNOSIS — I440 Atrioventricular block, first degree: Secondary | ICD-10-CM | POA: Insufficient documentation

## 2023-12-29 DIAGNOSIS — I5032 Chronic diastolic (congestive) heart failure: Secondary | ICD-10-CM | POA: Diagnosis not present

## 2023-12-29 DIAGNOSIS — N2889 Other specified disorders of kidney and ureter: Secondary | ICD-10-CM | POA: Diagnosis not present

## 2023-12-29 DIAGNOSIS — I442 Atrioventricular block, complete: Secondary | ICD-10-CM

## 2023-12-29 DIAGNOSIS — I4430 Unspecified atrioventricular block: Secondary | ICD-10-CM | POA: Diagnosis not present

## 2023-12-29 DIAGNOSIS — I10 Essential (primary) hypertension: Secondary | ICD-10-CM | POA: Diagnosis not present

## 2023-12-29 DIAGNOSIS — R109 Unspecified abdominal pain: Secondary | ICD-10-CM | POA: Insufficient documentation

## 2023-12-29 LAB — ECHOCARDIOGRAM COMPLETE
AR max vel: 1.14 cm2
AV Area VTI: 1.09 cm2
AV Area mean vel: 1.14 cm2
AV Mean grad: 17.3 mmHg
AV Peak grad: 30.8 mmHg
Ao pk vel: 2.78 m/s
Area-P 1/2: 2.99 cm2
Height: 65 in
S' Lateral: 3.8 cm
Weight: 3209.9 [oz_av]

## 2023-12-29 LAB — RENAL FUNCTION PANEL
Albumin: 2.5 g/dL — ABNORMAL LOW (ref 3.5–5.0)
Anion gap: 13 (ref 5–15)
BUN: 33 mg/dL — ABNORMAL HIGH (ref 6–20)
CO2: 25 mmol/L (ref 22–32)
Calcium: 7.9 mg/dL — ABNORMAL LOW (ref 8.9–10.3)
Chloride: 96 mmol/L — ABNORMAL LOW (ref 98–111)
Creatinine, Ser: 8.99 mg/dL — ABNORMAL HIGH (ref 0.61–1.24)
GFR, Estimated: 6 mL/min — ABNORMAL LOW (ref >60)
Glucose, Bld: 83 mg/dL (ref 70–99)
Phosphorus: 3.4 mg/dL (ref 2.5–4.6)
Potassium: 4.1 mmol/L (ref 3.5–5.1)
Sodium: 134 mmol/L — ABNORMAL LOW (ref 135–145)

## 2023-12-29 LAB — HEMOGLOBIN AND HEMATOCRIT, BLOOD
HCT: 25.8 % — ABNORMAL LOW (ref 39.0–52.0)
HCT: 28.8 % — ABNORMAL LOW (ref 39.0–52.0)
Hemoglobin: 8.4 g/dL — ABNORMAL LOW (ref 13.0–17.0)
Hemoglobin: 9.4 g/dL — ABNORMAL LOW (ref 13.0–17.0)

## 2023-12-29 LAB — MAGNESIUM: Magnesium: 2 mg/dL (ref 1.7–2.4)

## 2023-12-29 MED ORDER — HYDRALAZINE HCL 25 MG PO TABS: 25.0000 mg | ORAL_TABLET | Freq: Three times a day (TID) | ORAL | Status: DC

## 2023-12-29 MED ORDER — ATORVASTATIN CALCIUM 80 MG PO TABS
80.0000 mg | ORAL_TABLET | Freq: Every day | ORAL | Status: DC
  Administered 2023-12-29 – 2024-01-02 (×5): 80 mg via ORAL
  Filled 2023-12-29 (×5): qty 1

## 2023-12-29 MED ORDER — HEPARIN SODIUM (PORCINE) 1000 UNIT/ML IJ SOLN
INTRAMUSCULAR | Status: AC
  Filled 2023-12-29: qty 4

## 2023-12-29 MED ORDER — HYDRALAZINE HCL 25 MG PO TABS
25.0000 mg | ORAL_TABLET | Freq: Three times a day (TID) | ORAL | Status: DC
  Administered 2023-12-29 – 2024-01-02 (×13): 25 mg via ORAL
  Filled 2023-12-29 (×13): qty 1

## 2023-12-29 MED ORDER — CLONIDINE HCL 0.1 MG/24HR TD PTWK: 0.1000 mg | MEDICATED_PATCH | TRANSDERMAL | Status: DC

## 2023-12-29 MED ORDER — BISACODYL 5 MG PO TBEC
5.0000 mg | DELAYED_RELEASE_TABLET | Freq: Every day | ORAL | Status: DC | PRN
  Administered 2023-12-30: 5 mg via ORAL
  Filled 2023-12-29: qty 1

## 2023-12-29 MED ORDER — HYDROMORPHONE HCL 1 MG/ML IJ SOLN
0.5000 mg | Freq: Once | INTRAMUSCULAR | Status: AC
  Administered 2023-12-29: 0.5 mg via INTRAVENOUS
  Filled 2023-12-29: qty 1

## 2023-12-29 MED ORDER — HEPARIN SODIUM (PORCINE) 1000 UNIT/ML IJ SOLN
3200.0000 [IU] | Freq: Once | INTRAMUSCULAR | Status: AC
Start: 2023-12-29 — End: 2024-01-01
  Administered 2024-01-01: 3200 [IU]
  Filled 2023-12-29: qty 3.2

## 2023-12-29 NOTE — Assessment & Plan Note (Addendum)
 Per cardiology, normal sinus rhythm with heart rates in 40s-60s, first-degree AV block, with multiple pauses the previous day ranging from 3.57 seconds to 5.26 seconds. Recommend OP sleep study - Cardiology consulted, appreciate reccomendations  - Per cardio, ZIO monitor ordered - Atorvastatin  80 mg daily - Avoid nodal agents - AM labs: RFP, Mag

## 2023-12-29 NOTE — Assessment & Plan Note (Addendum)
 Cardiology consulted, added hydralazine  25 mg 3 times daily in addition to amlodipine  and Isodil. Per pharm, hydralazine  has increased mortality benefit for african americans. Will f/u with Cardiology on reasoning for hydralazine  vs clonidine  - Continue home amlodipine  10 daily, isosorbide  20 twice daily - Hydralazine  25 mg TID - D/c metoprolol  - Follow up Echo

## 2023-12-29 NOTE — Progress Notes (Addendum)
 Progress Note  Patient Name: James Wall Date of Encounter: 12/29/2023 [facility] Cardiologist: Stanly DELENA Leavens, MD   Interval Summary   Patient reported shortness of breath overnight and while on dialysis.  Denied chest pain or palpitations.  Was tearful during interview about his current medical state and the passing of a family member   Vital Signs Vitals:   12/29/23 0755 12/29/23 0830 12/29/23 0900 12/29/23 0930  BP: 129/79 (!) 155/84 (!) 152/81 (!) 148/66  Pulse: (!) 59 72 63 (!) 58  Resp: (!) 9 (!) 28 19 (!) 24  Temp:      TempSrc:      SpO2: 95% 98% 99% 100%  Weight:      Height:        Intake/Output Summary (Last 24 hours) at 12/29/2023 0947 Last data filed at 12/28/2023 2000 Gross per 24 hour  Intake 400 ml  Output --  Net 400 ml      12/29/2023    7:44 AM 12/29/2023    5:00 AM 12/28/2023    3:50 AM  Last 3 Weights  Weight (lbs) 204 lb 2.3 oz 198 lb 10.2 oz 160 lb 0.9 oz  Weight (kg) 92.6 kg 90.1 kg 72.6 kg      Telemetry/ECG  During interview patient was sinus tachycardic HR 120 on HD Telemetry prior: sinus rhythm with numerous episodes of non-conducted p waves, pause ~3s with PR prolongation prior and after dropped beat - Personally Reviewed  Physical Exam  GEN: No acute distress in bed during HD with Heimdal in place Cardiac: regular rhythm tachycardic rate, no murmurs, rubs, or gallops.  Respiratory: Clear to auscultation bilaterally. MS: No edema  Assessment & Plan  James Wall is a 58 y.o. male with a hx of tobacco use, COPD, hypertension, hyperlipidemia, cirrhosis, ESRD on HD, heart failure with improved LVEF, paroxysmal SVT, prior CVA on 09/2021, OSA, and prostate cancer initially diagnosed in 2023 who is admitted for ruptured left renal neoplasm with extensive retroperitoneal bleed s/p IR embolization of left renal artery. Patient was noted to have a 5.3s pause on telemetry. Cardiology consulted.   High Degree AV  block Occurred while patient was asleep.  Outpatient zio monitor has been ordered. Patient reports he sent back his prior zio for bradycardia though was having issues with the mail company who had not sent it in. Appears outpatient zio was never received.  Avoiding AV nodal blocking agents See telemetry above.   Patient has only received one dose of BB in the last 3 days. If pauses are ongoing tomorrow may need to consider EP consultation as he also has a history of SVT now off AV nodal blocking agents.   OSA Patient not using CPAP machine. Most likely contributing to above.  Given time since diagnosis, would need new sleep study to obtain CPAP machine.  Recommend outpatient sleep study  Hypertension BP: 148/66 Patient currently in HD. Continue amlodipine  10 mg Continue isosorbide  dinitrate 20 BID Continue hydralazine  25 mg TID  Chronic diastolic heart failure Volume management per nephrology GDMT limited by renal function.  Defer BB with AV block as above.   CAD Patient has historically deferred cardiac catheterization 2/2 to wanting GA. CT imaging shows extensive coronary artery calcifications. Reporting DOE, though also has COPD.  Continue to discuss outpatient, would no pursue ischemic evaluation with other modalities if patient continue to defer cardiac catheterization.  Would hold on ASA given retroperitoneal bleed and per allergy list caused congestion with  renal disease  Hyperlipidemia Change to lipitor 80 mg to intensify medical therapy while on ESRD  Per primary ESRD on HD Retroperitoneal bleed s/p IR embolization of left renal artery COPD GERD     For questions or updates, please contact [facility] Please consult [email or web address] for contact info under       Signed, Leontine LOISE Salen, PA-C   Personally seen and examined. Agree with above.  Metoprolol  50 mg twice a day was stopped after 5.3-second pause yesterday.  Likely occurred while sleeping.   Has untreated sleep apnea.  Numerous pauses on telemetry about 3 seconds duration with increasing PR prolongation suggestive of increased vagal tone likely sleep apnea related.  I am controlled by hemodialysis.  Continue medication management for coronary artery disease which is stable.  Oneil Parchment, MD

## 2023-12-29 NOTE — Procedures (Signed)
 I was present at this dialysis session. I have reviewed the session itself and made appropriate changes.   Vital signs in last 24 hours:  Temp:  [97.7 F (36.5 C)-99.1 F (37.3 C)] 98.4 F (36.9 C) (11/25 0744) Pulse Rate:  [52-75] 72 (11/25 0830) Resp:  [9-28] 28 (11/25 0830) BP: (129-172)/(70-93) 155/84 (11/25 0830) SpO2:  [94 %-100 %] 98 % (11/25 0830) Weight:  [90.1 kg-92.6 kg] 92.6 kg (11/25 0744) Weight change: 8.2 kg Filed Weights   12/28/23 0350 12/29/23 0500 12/29/23 0744  Weight: 72.6 kg 90.1 kg 92.6 kg    Recent Labs  Lab 12/29/23 0521  NA 134*  K 4.1  CL 96*  CO2 25  GLUCOSE 83  BUN 33*  CREATININE 8.99*  CALCIUM  7.9*  PHOS 3.4    Recent Labs  Lab 12/25/23 1353 12/26/23 0237 12/26/23 0652 12/28/23 0551 12/28/23 1320 12/28/23 1924 12/29/23 0521  WBC 6.6 5.9  --  6.5  --   --   --   HGB 7.5* 6.1*   < > 8.2* 9.4* 8.6* 8.4*  HCT 23.4* 19.4*   < > 24.6* 28.3* 26.0* 25.8*  MCV 101.7* 102.1*  --  95.3  --   --   --   PLT 133* 136*  --  141*  --   --   --    < > = values in this interval not displayed.    Scheduled Meds:  acetaminophen   500 mg Oral Q6H   amLODipine   10 mg Oral QHS   arformoterol   15 mcg Nebulization BID   And   umeclidinium bromide   1 puff Inhalation Daily   Chlorhexidine  Gluconate Cloth  6 each Topical Daily   Chlorhexidine  Gluconate Cloth  6 each Topical Q0600   cinacalcet   150 mg Oral Once per day on Sunday Tuesday Friday   darbepoetin (ARANESP ) injection - DIALYSIS  150 mcg Subcutaneous Q Sun-1800   doxercalciferol   4 mcg Intravenous Once per day on Sunday Tuesday Friday   ferric citrate   630 mg Oral TID WC   hydrALAZINE   25 mg Oral Q8H   isosorbide  dinitrate  20 mg Oral BID   loratadine   10 mg Oral Daily   pantoprazole   40 mg Oral Daily   polyethylene glycol  17 g Oral Daily   rosuvastatin   10 mg Oral Daily   senna  2 tablet Oral Daily   Continuous Infusions: PRN Meds:.albuterol , cinacalcet , doxercalciferol , HYDROmorphone   (DILAUDID ) injection **OR** [DISCONTINUED]  HYDROmorphone  (DILAUDID ) injection, ondansetron , mouth rinse, oxyCODONE , simethicone    Fairy DELENA Sellar,  MD 12/29/2023, 8:40 AM

## 2023-12-29 NOTE — Assessment & Plan Note (Signed)
 HFpEF: CTM volume status.  Medications as above. GERD: Protonix  40 mg daily  HLD: Crestor  10 mg daily  Secondary hyperparathyroidism: Sensipar  90 mg daily  COPD: home Stiolto Respimat , on formulary Brovana  neb BID and Incruse Ellipta 

## 2023-12-29 NOTE — Progress Notes (Signed)
 Daily Progress Note Intern Pager: [phone]  Patient name: James Wall [facility] Medical record number: [id number] Date of birth: 1965-10-21 Age: 58 y.o. Gender: male  Primary Care Provider: Janna Ferrier, DO Consultants: PCCM, IR, Nephrology, Urology Code Status: Full   Pt Overview and Major Events to Date:  11/21: Admitted to ICU, IR embolization 11/22: Transferred to FMTS 11/23: HD 11/25: HD  Assessment and Plan: Advertently with set this 58 year old male with a PMHx of COPD, HTN, HLD, cirrhosis, ESRD on HD, HF P EF, paroxysmal SVT, prior CVA 09/2021, OSA, and prostate cancer diagnosed in 2023 was admitted ruptured left neoplasm with extensive pressure now bleed s/p IR embolization of the left renal artery, currently being monitored for bradycardia with sinus pauses and abdominal pain.  Stat CT abdominal pelvis completed yesterday showed swelling which is likely attributing to abdominal pain.  Additionally, patient taking Dilaudid  and not following bowel regimen, with last bowel movement Friday.  Will order bisacodyl  and discontinue Dilaudid  and encourage patient to take Tylenol  with as needed oxycodone  for breakthrough pain.  Cardiology consulted due to multiple sinus pauses with bradycardia.  They will monitor with ZIO monitor adjusting medications as below. Assessment & Plan Stable Perinephric hematoma Abdominal pain S/p L renal artery embolization 11/21. H&H q8 stable. CT abdomen and pelvis revealed possible colitis and increased anarsca L >> R. Only taking dilaudid , will d/c since no BM and not taking other pain medications. Unlikely colitis due to constipation. Space out H&H because stable. - H&H q12 - Pain regimen: Tylenol  500 every 6, oxycodone  5 Q6 PRN - Simethicone  as needed added for bloating/flatulence - Bowel regimen: Bisocodyl once, monitor - Continue Zofran  ODT as needed - Consider repeat CT if acute drop in Hgb Sinus pause Chronic sinus bradycardia First degree  AV block Per cardiology, normal sinus rhythm with heart rates in 40s-60s, first-degree AV block, with multiple pauses the previous day ranging from 3.57 seconds to 5.26 seconds. Recommend OP sleep study - Cardiology consulted, appreciate reccomendations  - Per cardio, ZIO monitor ordered - Atorvastatin  80 mg daily - Avoid nodal agents - AM labs: RFP, Mag HTN (hypertension) Cardiology consulted, added hydralazine  25 mg 3 times daily in addition to amlodipine  and Isodil. Per pharm, hydralazine  has increased mortality benefit for african americans. Will f/u with Cardiology on reasoning for hydralazine  vs clonidine  - Continue home amlodipine  10 daily, isosorbide  20 twice daily - Hydralazine  25 mg TID - D/c metoprolol  - Follow up Echo ESRD (end stage renal disease) on dialysis (HCC) Currently in HD - Appreciate ongoing nephrology recommendations - HD per nephrology Chronic health problem HFpEF: CTM volume status.  Medications as above. GERD: Protonix  40 mg daily  HLD: Crestor  10 mg daily  Secondary hyperparathyroidism: Sensipar  90 mg daily  COPD: home Stiolto Respimat , on formulary Brovana  neb BID and Incruse Ellipta    FEN/GI: Renal diet with 1200 fluid restriction PPx: SCDs, hepatin with dialysis Dispo: Tele  Subjective:  Currently in dialysis.  Endorses abdominal pain 7 out of 10 and a bandlike fashion across lower abdomen radiating to the back.  States it is worse with twisting, states relieving gas makes it better.  Last BM Friday, states he only has 1-2 bowel movements per week when on binders.  Objective: Temp:  [97.7 F (36.5 C)-99.1 F (37.3 C)] 98.4 F (36.9 C) (11/25 0744) Pulse Rate:  [52-75] 63 (11/25 0900) Resp:  [9-28] 19 (11/25 0900) BP: (129-162)/(70-93) 152/81 (11/25 0900) SpO2:  [94 %-100 %] 99 % (11/25  0900) Weight:  [90.1 kg-92.6 kg] 92.6 kg (11/25 0744)  Physical Exam: General: Well-appearing, no acute distress Cardio: Bradycardia, regular rhythm, no  murmurs on exam. Pulm: Clear, no wheezing, no crackles. No increased work of breathing Abdominal: Soft, tender, non-distended Extremities: no peripheral edema   Laboratory: Most recent CBC Lab Results  Component Value Date   WBC 6.5 12/28/2023   HGB 8.4 (L) 12/29/2023   HCT 25.8 (L) 12/29/2023   MCV 95.3 12/28/2023   PLT 141 (L) 12/28/2023   Most recent BMP    Latest Ref Rng & Units 12/29/2023    5:21 AM  BMP  Glucose 70 - 99 mg/dL 83   BUN 6 - 20 mg/dL 33   Creatinine 9.38 - 1.24 mg/dL 1.00   Sodium 864 - 854 mmol/L 134   Potassium 3.5 - 5.1 mmol/L 4.1   Chloride 98 - 111 mmol/L 96   CO2 22 - 32 mmol/L 25   Calcium  8.9 - 10.3 mg/dL 7.9     Imaging/Diagnostic Tests: CT abdomen and pelvis with contrast: Stable left perinephric hematoma slightly decreased in size compared to prior.  Slightly increased density abdomen pelvis with findings.  Mild wall thickening of the descending colon compared to prior, possible colitis.  Small bilateral pleural effusions slightly increased on the left.  Increased peripheral consolidation in the left lower lobe, atelectasis versus pneumonia.  Increased anasarca and left greater than right flank edema.  Elodie Palma, MD 12/29/2023, 9:21 AM  PGY-1, [facility] FPTS Intern pager: [phone], text pages welcome Secure chat group [facility] Teaching Service

## 2023-12-29 NOTE — Assessment & Plan Note (Addendum)
 S/p L renal artery embolization 11/21. H&H q8 stable. CT abdomen and pelvis revealed possible colitis and increased anarsca L >> R. Only taking dilaudid , will d/c since no BM and not taking other pain medications. Unlikely colitis due to constipation. Space out H&H because stable. - H&H q12 - Pain regimen: Tylenol  500 every 6, oxycodone  5 Q6 PRN - Simethicone  as needed added for bloating/flatulence - Bowel regimen: Bisocodyl once, monitor - Continue Zofran  ODT as needed - Consider repeat CT if acute drop in Hgb

## 2023-12-29 NOTE — Evaluation (Signed)
 Physical Therapy Evaluation Patient Details Name: James Wall MRN: [id number] DOB: 1965-12-08 Today's Date: 12/29/2023  History of Present Illness  The pt is a 58 yo male presenting 11/21 with abdominal pain. Work up revealed large L perinephritic hematoma with active extravasation and extensive retroperitoneal hemorrhage. Likely secondary to rupture of known renal mass. S/p L renal embolization 11/21. PMH includes: ESRD on HD, cirrhosis, prostate cancer, anxiety, arthritis, CHF, COPD, NICM, PSVT, CVA.   Clinical Impression  Pt in bed upon arrival of PT, agreeable to evaluation at this time. Prior to admission the pt was ambulating with use of a cane, reports able to manage to and from HD but generally limited endurance. The pt reports independent with ADLs, but significant difficulty recently due to poor endurance, strength, and lack of assistance. The pt required minA for bed mobility, sit-stand transfers, and short bout of gait in the room. Pt with no overt LOB in stance, but with significant deficits in muscular power, strength, and stability from his baseline, and is at increased risk of falls at this time. Recommend continued skilled PT acutely and continued post-acute rehab <3hours/day to improve independence with mobility and self-care prior to return home alone.    If plan is discharge home, recommend the following: A little help with walking and/or transfers;A little help with bathing/dressing/bathroom;Assistance with cooking/housework;Help with stairs or ramp for entrance   Can travel by private vehicle   Yes    Equipment Recommendations Rolling walker (2 wheels) (tub bench)  Recommendations for Other Services       Functional Status Assessment Patient has had a recent decline in their functional status and demonstrates the ability to make significant improvements in function in a reasonable and predictable amount of time.     Precautions / Restrictions  Precautions Precautions: Fall Recall of Precautions/Restrictions: Intact Restrictions Weight Bearing Restrictions Per Provider Order: No      Mobility  Bed Mobility Overal bed mobility: Needs Assistance Bed Mobility: Supine to Sit, Sit to Supine     Supine to sit: Min assist Sit to supine: Min assist   General bed mobility comments: min A to elevate trunk, assist with LEs back onto bed    Transfers Overall transfer level: Needs assistance Equipment used: Rolling walker (2 wheels) Transfers: Sit to/from Stand Sit to Stand: Min assist, +2 physical assistance           General transfer comment: min verbal cues for hand placement, cues for safe/controlled speed duirng stand-sit transititons    Ambulation/Gait Ambulation/Gait assistance: Min assist Gait Distance (Feet): 4 Feet (+ 5 ft) Assistive device: Rolling walker (2 wheels) Gait Pattern/deviations: Step-through pattern, Decreased stride length, Trunk flexed Gait velocity: decreased     General Gait Details: pt with small lateral steps along EOB followed by small steps forwards and back, assist for balance and to manage RW, VSS. limited by pt fatigue and arrival of echo    Balance Overall balance assessment: Needs assistance Sitting-balance support: No upper extremity supported, Feet supported Sitting balance-Leahy Scale: Good     Standing balance support: Bilateral upper extremity supported, During functional activity Standing balance-Leahy Scale: Poor Standing balance comment: dependent on BUE support                             Pertinent Vitals/Pain Pain Assessment Pain Assessment: Faces Faces Pain Scale: Hurts little more Pain Location: back, R hip, L hip, stomach Pain Descriptors / Indicators: Grimacing, Guarding,  Moaning Pain Intervention(s): Limited activity within patient's tolerance, Monitored during session    Home Living Family/patient expects to be discharged to:: Private  residence Living Arrangements: Alone Available Help at Discharge: Family;Available PRN/intermittently Type of Home: Apartment Home Access: Level entry       Home Layout: One level Home Equipment: Cane - quad;Cane - single point;Rollator (4 wheels)      Prior Function Prior Level of Function : Needs assist             Mobility Comments: pt reports use of cane, is scared of falling with rollator. has to walk a couple hundred feet to leave apt building. ADLs Comments: pt reports Ind with ADLs, assist with IADLs or transportation, used to have assist with meds     Extremity/Trunk Assessment   Upper Extremity Assessment Upper Extremity Assessment: Defer to OT evaluation    Lower Extremity Assessment Lower Extremity Assessment: Generalized weakness    Cervical / Trunk Assessment Cervical / Trunk Assessment: Kyphotic  Communication   Communication Communication: No apparent difficulties    Cognition Arousal: Alert Behavior During Therapy: WFL for tasks assessed/performed, Lability   PT - Cognitive impairments: No family/caregiver present to determine baseline, No apparent impairments                       PT - Cognition Comments: pt following commands, emotionally labile, tearful multiple times in session Following commands: Intact       Cueing Cueing Techniques: Verbal cues, Visual cues     General Comments General comments (skin integrity, edema, etc.): VSS on RA    Exercises     Assessment/Plan    PT Assessment Patient needs continued PT services  PT Problem List Decreased strength;Decreased activity tolerance;Decreased balance;Decreased mobility;Pain       PT Treatment Interventions DME instruction;Gait training;Functional mobility training;Stair training;Therapeutic activities;Therapeutic exercise;Balance training;Patient/family education    PT Goals (Current goals can be found in the Care Plan section)  Acute Rehab PT Goals Patient Stated  Goal: to improve mobility PT Goal Formulation: With patient Time For Goal Achievement: 01/12/24 Potential to Achieve Goals: Good    Frequency Min 2X/week     Co-evaluation   Reason for Co-Treatment: For patient/therapist safety;To address functional/ADL transfers PT goals addressed during session: Mobility/safety with mobility;Balance;Strengthening/ROM OT goals addressed during session: ADL's and self-care;Proper use of Adaptive equipment and DME       AM-PAC PT 6 Clicks Mobility  Outcome Measure Help needed turning from your back to your side while in a flat bed without using bedrails?: A Little Help needed moving from lying on your back to sitting on the side of a flat bed without using bedrails?: A Little Help needed moving to and from a bed to a chair (including a wheelchair)?: A Little Help needed standing up from a chair using your arms (e.g., wheelchair or bedside chair)?: A Little Help needed to walk in hospital room?: Total (<20 ft) Help needed climbing 3-5 steps with a railing? : Total 6 Click Score: 14    End of Session Equipment Utilized During Treatment: Gait belt Activity Tolerance: Patient tolerated treatment well;Patient limited by fatigue Patient left: in bed;with call bell/phone within reach;with bed alarm set Nurse Communication: Mobility status PT Visit Diagnosis: Unsteadiness on feet (R26.81);Muscle weakness (generalized) (M62.81);Pain Pain - Right/Left: Right Pain - part of body:  (R hip, L back, central abdomen)    Time: 8696-8666 PT Time Calculation (min) (ACUTE ONLY): 30 min   Charges:  PT Evaluation $PT Eval Moderate Complexity: 1 Mod   PT General Charges $$ ACUTE PT VISIT: 1 Visit         [name], PT, DPT   Acute Rehabilitation Department Office [phone] Secure Chat Communication Preferred  [name] 12/29/2023, 3:39 PM

## 2023-12-29 NOTE — Assessment & Plan Note (Addendum)
 Currently in HD - Appreciate ongoing nephrology recommendations - HD per nephrology

## 2023-12-29 NOTE — Evaluation (Signed)
 Occupational Therapy Evaluation Patient Details Name: James Wall MRN: [id number] DOB: 1966/01/30 Today's Date: 12/29/2023   History of Present Illness   The pt is a 58 yo male presenting 11/21 with abdominal pain. Work up revealed large L perinephritic hematoma with active extravasation and extensive retroperitoneal hemorrhage. Likely secondary to rupture of known renal mass. S/p L renal embolization 11/21. PMH includes: ESRD on HD, cirrhosis, prostate cancer, anxiety, arthritis, CHF, COPD, NICM, PSVT, CVA.     Clinical Impressions Pt presents with decline in function and safety with ADLs and ADL mobility with impaired strength, balance and endurance. PTA, pt reports that he lives alone and was Ind with ADLs, assist with IADLs or transportation, used to have assist with meds, use of cane, is scared of falling with rollator and no longer gets into tub shower for fear of falling and difficulty getting out of tub, has to walk a couple hundred feet to leave apt building. Pt tearful at times due to CLOF and recent death of a friend. Pt currently requires min A to sit EOB, mod A with LB ADLs, min A +2 with STS/mobility using RW. Pt on RA with O2 SATs dropping to 86% but recovering quickly to 93-94%, BP 172/97 standing at EOB. OT will follow acutely to maximize level of function and safety   If plan is discharge home, recommend the following:   A little help with bathing/dressing/bathroom;A little help with walking and/or transfers;Assistance with cooking/housework;Assist for transportation;Help with stairs or ramp for entrance     Functional Status Assessment   Patient has had a recent decline in their functional status and demonstrates the ability to make significant improvements in function in a reasonable and predictable amount of time.     Equipment Recommendations   Tub/shower bench;Other (comment);BSC/3in1 (LH bath sponge, grab bars)     Recommendations for Other  Services         Precautions/Restrictions   Precautions Precautions: Fall Recall of Precautions/Restrictions: Intact Restrictions Weight Bearing Restrictions Per Provider Order: No     Mobility Bed Mobility Overal bed mobility: Needs Assistance Bed Mobility: Supine to Sit, Sit to Supine     Supine to sit: Min assist Sit to supine: Min assist   General bed mobility comments: min A to elevate trunk, assist with LEs back onto bed    Transfers Overall transfer level: Needs assistance Equipment used: Rolling walker (2 wheels) Transfers: Sit to/from Stand Sit to Stand: Min assist, +2 physical assistance           General transfer comment: min verbal cues for hand placement, cues for safe/controlled speed duirng stand-sit transititons      Balance Overall balance assessment: Needs assistance Sitting-balance support: No upper extremity supported, Feet supported Sitting balance-Leahy Scale: Good     Standing balance support: Bilateral upper extremity supported, During functional activity Standing balance-Leahy Scale: Poor                             ADL either performed or assessed with clinical judgement   ADL Overall ADL's : Needs assistance/impaired Eating/Feeding: Set up;Independent;Sitting   Grooming: Wash/dry hands;Wash/dry face;Contact guard assist;Sitting   Upper Body Bathing: Contact guard assist;Sitting   Lower Body Bathing: Moderate assistance   Upper Body Dressing : Contact guard assist;Sitting   Lower Body Dressing: Moderate assistance   Toilet Transfer: Minimal assistance;+2 for physical assistance;Rolling walker (2 wheels)   Toileting- Clothing Manipulation and Hygiene: Minimal assistance;Sit to/from  stand       Functional mobility during ADLs: Minimal assistance;+2 for physical assistance;Rolling walker (2 wheels);Cueing for safety       Vision Baseline Vision/History: 1 Wears glasses Ability to See in Adequate Light: 0  Adequate Patient Visual Report: No change from baseline       Perception         Praxis         Pertinent Vitals/Pain Pain Assessment Pain Assessment: Faces Faces Pain Scale: Hurts little more Pain Location: back, R hip, L hip, stomach Pain Descriptors / Indicators: Grimacing, Guarding, Moaning, Constant Pain Intervention(s): Limited activity within patient's tolerance, Monitored during session, Premedicated before session, Repositioned     Extremity/Trunk Assessment Upper Extremity Assessment Upper Extremity Assessment: Generalized weakness;Right hand dominant   Lower Extremity Assessment Lower Extremity Assessment: Defer to PT evaluation       Communication Communication Communication: No apparent difficulties   Cognition Arousal: Alert Behavior During Therapy: WFL for tasks assessed/performed Cognition: No apparent impairments             OT - Cognition Comments: pt tearful at times when discussing his CLOF and he mentioned recent death of a friend                         Cueing  General Comments   Cueing Techniques: Verbal cues;Visual cues      Exercises     Shoulder Instructions      Home Living Family/patient expects to be discharged to:: Private residence Living Arrangements: Alone Available Help at Discharge: Family;Available PRN/intermittently Type of Home: Apartment Home Access: Level entry     Home Layout: One level     Bathroom Shower/Tub: Tub/shower unit;Sponge bathes at baseline   Bathroom Toilet: Standard Bathroom Accessibility: Yes   Home Equipment: Cane - quad;Cane - single point;Rollator (4 wheels)          Prior Functioning/Environment Prior Level of Function : Needs assist             Mobility Comments: pt reports use of cane, is scared of falling with rollator. has to walk a couple hundred feet to leave apt building. ADLs Comments: pt reports Ind with ADLs, assist with IADLs or transportation, used to  have assist with meds    OT Problem List: Decreased strength;Decreased knowledge of use of DME or AE;Decreased activity tolerance;Impaired balance (sitting and/or standing);Pain   OT Treatment/Interventions: Self-care/ADL training;Therapeutic exercise;Patient/family education;Splinting;Therapeutic activities;DME and/or AE instruction      OT Goals(Current goals can be found in the care plan section)   Acute Rehab OT Goals Patient Stated Goal: get better OT Goal Formulation: With patient Time For Goal Achievement: 01/12/24 Potential to Achieve Goals: Good ADL Goals Pt Will Perform Grooming: with contact guard assist;with supervision;standing Pt Will Perform Upper Body Bathing: with supervision;with set-up;sitting Pt Will Perform Lower Body Bathing: with min assist;sitting/lateral leans;sit to/from stand;with adaptive equipment Pt Will Perform Upper Body Dressing: with supervision;with set-up;sitting Pt Will Transfer to Toilet: with min assist;with contact guard assist;ambulating Pt Will Perform Toileting - Clothing Manipulation and hygiene: with contact guard assist;with supervision;sitting/lateral leans;sit to/from stand   OT Frequency:  Min 2X/week    Co-evaluation PT/OT/SLP Co-Evaluation/Treatment: Yes Reason for Co-Treatment: For patient/therapist safety;To address functional/ADL transfers   OT goals addressed during session: ADL's and self-care;Proper use of Adaptive equipment and DME      AM-PAC OT 6 Clicks Daily Activity     Outcome Measure Help from another  person eating meals?: None Help from another person taking care of personal grooming?: A Little Help from another person toileting, which includes using toliet, bedpan, or urinal?: A Little Help from another person bathing (including washing, rinsing, drying)?: A Lot Help from another person to put on and taking off regular upper body clothing?: A Little Help from another person to put on and taking off regular  lower body clothing?: A Lot 6 Click Score: 17   End of Session Equipment Utilized During Treatment: Gait belt;Rolling walker (2 wheels) Nurse Communication: Mobility status  Activity Tolerance: Patient tolerated treatment well Patient left: in bed;with call bell/phone within reach  OT Visit Diagnosis: Unsteadiness on feet (R26.81);Other abnormalities of gait and mobility (R26.89);Muscle weakness (generalized) (M62.81);Pain Pain - part of body: Hip (back, stomach)                Time: 8698-8667 OT Time Calculation (min): 31 min Charges:  OT General Charges $OT Visit: 1 Visit OT Evaluation $OT Eval Moderate Complexity: 1 Mod    [name] 12/29/2023, 1:48 PM

## 2023-12-29 NOTE — TOC Initial Note (Addendum)
 Transition of Care [facility]) - Initial/Assessment Note    Patient Details  Name: James Wall MRN: [id number] Date of Birth: 28-Aug-1965  Transition [facility] CM/SW Contact:    Isaiah Public, LCSWA Phone Number: 12/29/2023, 4:11 PM  Clinical Narrative:                  CSW received consult for possible SNF placement at time of discharge. CSW spoke with patient regarding PT recommendation of SNF placement at time of discharge. Patient reports PTA he comes from home alone. Patient reports he has his sister that he can call for support if needed.Patient expressed understanding of PT recommendation and politely declined SNF placement at time of discharge. Patient reports he would like to return home with [facility] PT. CSW informed patient that CM will follow up for home needs. All questions answered.No further questions reported at this time. CSW informed CM.CSW to continue to follow and assist with discharge planning needs.   Update- Patient is going to speak with his sister tonight and see if she is able to support him at his house when he dcs home. Patient request for CSW to follow up with him tomorrow.  Expected Discharge Plan:  (home with hh PT) Barriers to Discharge: Continued Medical Work up   Patient Goals and CMS Choice Patient states their goals for this hospitalization and ongoing recovery are:: to return home with [facility] PT          Expected Discharge Plan and Services In-house Referral: Clinical Social Work     Living arrangements for the past 2 months: Single Family Home                                      Prior Living Arrangements/Services Living arrangements for the past 2 months: Single Family Home Lives with:: Self Patient language and need for interpreter reviewed:: Yes Do you feel safe going back to the place where you live?: Yes      Need for Family Participation in Patient Care: Yes (Comment) (no support at home but says he can call has sister if  needed)     Criminal Activity/Legal Involvement Pertinent to Current Situation/Hospitalization: No - Comment as needed  Activities of Daily Living   ADL Screening (condition at time of admission) Independently performs ADLs?: Yes (appropriate for developmental age) Is the patient deaf or have difficulty hearing?: No Does the patient have difficulty seeing, even when wearing glasses/contacts?: No Does the patient have difficulty concentrating, remembering, or making decisions?: No  Permission Sought/Granted Permission sought to share information with : Case Manager, Magazine Features Editor, Family Supports                Emotional Assessment   Attitude/Demeanor/Rapport: Gracious Affect (typically observed): Calm Orientation: : Oriented to Self, Oriented to Place, Oriented to  Time, Oriented to Situation Alcohol / Substance Use: Not Applicable Psych Involvement: No (comment)  Admission diagnosis:  Retroperitoneal hematoma [K68.3] Left renal mass [N28.89] Perinephric hematoma [S37.019A] Patient Active Problem List   Diagnosis Date Noted   Abdominal pain 12/29/2023   First degree AV block 12/29/2023   Sinus pause 12/28/2023   Left renal mass 12/28/2023   HTN (hypertension) 12/26/2023   Stable Perinephric hematoma 12/25/2023   Dyspnea 08/20/2023   CHF (congestive heart failure) (HCC) 08/20/2023   Chronic health problem 08/20/2023   Chronic sinus bradycardia 08/20/2023   Constipation 11/06/2022  Lactic acidosis 10/21/2022   Diarrhea 10/21/2022   Nausea and vomiting 10/19/2022   Abnormal finding on CT scan 10/19/2022   Hypertensive retinopathy of both eyes 04/10/2022   Hospital discharge follow-up 10/22/2021   CVA (cerebral vascular accident) (HCC) 09/15/2021   Elevated prostate specific antigen (PSA) 06/12/2021   Malignant neoplasm of prostate (HCC) 03/21/2021   Hypertensive heart disease with heart failure (HCC) 11/01/2020   Microcytic anemia 11/01/2020    Chronic obstructive pulmonary disease (HCC) 08/28/2020   Tobacco abuse 08/28/2020   Dyspnea on exertion 09/22/2019   Hypercalcemia 09/14/2019   Tubular adenoma of colon 12/13/2018   Situational insomnia 09/24/2017   Generalized anxiety disorder 09/17/2017   Bloodstream infection due to central venous catheter, initial encounter 09/16/2017   BPH (benign prostatic hyperplasia) 09/12/2017   Bacteremia associated with intravascular line 09/11/2017   Cirrhosis (HCC)    Complication of AV dialysis fistula 10/22/2015   ESRD (end stage renal disease) on dialysis (HCC) 10/22/2015   Hyperkalemia 10/22/2015   Dependence on renal dialysis 11/28/2014   Coagulation defect, unspecified 02/01/2014   Mild protein-calorie malnutrition 08/03/2013   Abnormal cardiovascular function study 07/12/2013   Abnormal stress test 06/22/2013   Hypothyroidism, unspecified 06/03/2013   Palpitations 05/31/2013   GERD (gastroesophageal reflux disease) 12/21/2012   Anemia in neoplastic disease 12/21/2012   ASTHMA 04/06/2008   SLEEP APNEA 04/06/2008   Secondary hyperparathyroidism of renal origin 03/05/2007   Patient's noncompliance with other medical treatment and regimen 03/05/2007   Personal history of nicotine  dependence 03/05/2007   Hypertensive chronic kidney disease with 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) 02/18/2007   PCP:  Janna Ferrier, DO Pharmacy:   [facility] [phone] - [place], [place] - [address] AT [facility] [address] [place] [address] Phone: [phone] Fax: [phone]     Social Drivers of Health (SDOH) Social History: SDOH Screenings   Food Insecurity: No Food Insecurity (12/25/2023)  Housing: Low Risk  (12/25/2023)  Transportation Needs: No Transportation Needs (12/25/2023)  Utilities: Not At Risk (12/25/2023)  Alcohol Screen: Low Risk  (08/27/2023)  Depression (PHQ2-9): Medium Risk (12/24/2023)   Financial Resource Strain: Low Risk  (08/27/2023)  Physical Activity: Inactive (08/27/2023)  Social Connections: Moderately Isolated (08/27/2023)  Stress: No Stress Concern Present (08/05/2022)  Tobacco Use: Medium Risk (12/25/2023)  Health Literacy: Adequate Health Literacy (08/27/2023)   SDOH Interventions:     Readmission Risk Interventions     No data to display

## 2023-12-29 NOTE — Progress Notes (Signed)
   12/29/23 1132  Vitals  Temp 98.6 F (37 C)  Pulse Rate 67  Resp 15  BP (!) 152/75  SpO2 99 %  O2 Device Nasal Cannula  Weight 91 kg  Type of Weight Post-Dialysis  Oxygen Therapy  Patient Activity (if Appropriate) In bed  Pulse Oximetry Type Continuous  Oximetry Probe Site Changed No  Post Treatment  Dialyzer Clearance Lightly streaked  Hemodialysis Intake (mL) 0 mL  Liters Processed 84  Fluid Removed (mL) 3000 mL  Tolerated HD Treatment Yes

## 2023-12-29 NOTE — Progress Notes (Signed)
 Pt. Came in on bed, alert and oriented. Consent signed and on file.  Pt. Started with no complaints  UF removed: Tx duration: 3.5 hours  Access used: Right CVC Access issue: None Catheter length: 1.6cc arterial; 1.6cc venous  Pt. On 02 at 2LPM via nasal cannula due to pt. Complained of shortness of breath   Tx completed and tolerated. Catheter dwelled and locked with heparin . Endorsed to floor nurse. Transported to room.  Daiki Dicostanzo Rubi B. Yajahira Tison, RN Kidney Dialysis Unit

## 2023-12-30 DIAGNOSIS — I4430 Unspecified atrioventricular block: Secondary | ICD-10-CM | POA: Diagnosis not present

## 2023-12-30 DIAGNOSIS — S37019A Minor contusion of unspecified kidney, initial encounter: Secondary | ICD-10-CM | POA: Diagnosis not present

## 2023-12-30 DIAGNOSIS — I5032 Chronic diastolic (congestive) heart failure: Secondary | ICD-10-CM | POA: Diagnosis not present

## 2023-12-30 DIAGNOSIS — I10 Essential (primary) hypertension: Secondary | ICD-10-CM | POA: Diagnosis not present

## 2023-12-30 LAB — RENAL FUNCTION PANEL
Albumin: 2.5 g/dL — ABNORMAL LOW (ref 3.5–5.0)
Anion gap: 13 (ref 5–15)
BUN: 24 mg/dL — ABNORMAL HIGH (ref 6–20)
CO2: 25 mmol/L (ref 22–32)
Calcium: 8.8 mg/dL — ABNORMAL LOW (ref 8.9–10.3)
Chloride: 98 mmol/L (ref 98–111)
Creatinine, Ser: 6.81 mg/dL — ABNORMAL HIGH (ref 0.61–1.24)
GFR, Estimated: 9 mL/min — ABNORMAL LOW (ref >60)
Glucose, Bld: 98 mg/dL (ref 70–99)
Phosphorus: 2.9 mg/dL (ref 2.5–4.6)
Potassium: 4.1 mmol/L (ref 3.5–5.1)
Sodium: 136 mmol/L (ref 135–145)

## 2023-12-30 LAB — MAGNESIUM: Magnesium: 1.9 mg/dL (ref 1.7–2.4)

## 2023-12-30 LAB — HEMOGLOBIN AND HEMATOCRIT, BLOOD
HCT: 27.8 % — ABNORMAL LOW (ref 39.0–52.0)
Hemoglobin: 9.1 g/dL — ABNORMAL LOW (ref 13.0–17.0)

## 2023-12-30 MED ORDER — BISACODYL 5 MG PO TBEC
5.0000 mg | DELAYED_RELEASE_TABLET | Freq: Once | ORAL | Status: AC
  Administered 2023-12-30: 5 mg via ORAL
  Filled 2023-12-30: qty 1

## 2023-12-30 MED ORDER — MAGNESIUM SULFATE IN D5W 1-5 GM/100ML-% IV SOLN
1.0000 g | Freq: Once | INTRAVENOUS | Status: AC
Start: 2023-12-30 — End: 2023-12-30
  Administered 2023-12-30: 1 g via INTRAVENOUS
  Filled 2023-12-30: qty 100

## 2023-12-30 MED ORDER — HYDROMORPHONE HCL 1 MG/ML IJ SOLN
0.5000 mg | Freq: Once | INTRAMUSCULAR | Status: AC
  Administered 2023-12-30: 0.5 mg via INTRAVENOUS
  Filled 2023-12-30: qty 1

## 2023-12-30 MED ORDER — HYDROCODONE-ACETAMINOPHEN 5-325 MG PO TABS
1.0000 | ORAL_TABLET | Freq: Four times a day (QID) | ORAL | Status: DC | PRN
  Administered 2023-12-30 – 2024-01-02 (×5): 1 via ORAL
  Filled 2023-12-30 (×5): qty 1

## 2023-12-30 MED ORDER — FLEET ENEMA RE ENEM
1.0000 | ENEMA | Freq: Once | RECTAL | Status: AC
  Administered 2023-12-30: 1 via RECTAL
  Filled 2023-12-30: qty 1

## 2023-12-30 MED ORDER — ONDANSETRON HCL 4 MG PO TABS
4.0000 mg | ORAL_TABLET | Freq: Three times a day (TID) | ORAL | Status: DC | PRN
Start: 2023-12-30 — End: 2024-01-02
  Administered 2023-12-30 – 2023-12-31 (×3): 4 mg via ORAL
  Filled 2023-12-30 (×3): qty 1

## 2023-12-30 MED ORDER — MAGNESIUM SULFATE 2 GM/50ML IV SOLN: 2.0000 g | Freq: Once | INTRAVENOUS | Status: DC

## 2023-12-30 NOTE — Assessment & Plan Note (Addendum)
 Bradycardia with first-degree AV block and intermediate pauses, last sinus pause 11/24. S/p 1 gm Mag - Cardiology consulted, appreciate reccomendations  - Per cardio, ZIO monitor ordered - Atorvastatin  80 mg daily - Outpatient sleep study recommended - Avoid nodal agents

## 2023-12-30 NOTE — Progress Notes (Signed)
 Daily Progress Note Intern Pager: [phone]  Patient name: Jermain [facility] Medical record number: [id number] Date of birth: 1965/07/30 Age: 58 y.o. Gender: male  Primary Care Provider: Janna Ferrier, DO Consultants: PCCM, IR, nephrology, urology Code Status: Full code  Pt Overview and Major Events to Date:  11/21: Admitted to ICU, IR embolization 11/22: Transferred to FMTS 11/23: HD 11/25: HD  Assessment and Plan: Lanard Arguijo is a 58 year old male who presented with abdominal pain 2/2 large left perinephric hematoma with extensive retroperitoneal hemorrhage s/p L renal embolization.  PMHx includes ESRD on HD, cirrhosis, prostate cancer, anxiety, arthritis, CHF, COPD, DM, PSVT, and CVA.  S/p Mag sulfate IVVPB 1 g 100mL for Mag 1.9 overnight. Barriers to discharge include abdominal pain in the setting of constipation. Will adjust pain medications and bowel regimen and continue to follow up.  Assessment & Plan Stable Perinephric hematoma Abdominal pain S/p L renal artery embolization 11/21. H&H stable.  Patient agrees with stop Dilaudid  and states oxy historically extreme nausea and vomiting. Communicate with nursing no IV dilaudid .  - H&H daily - Adjust pain regiment to Norco 5-325 1 tab q6 PRN - Simethicone  as needed added for bloating/flatulence - Bowel regimen: Fleet Enema with bisocodyl 5 mg x1 - Continue Zofran  ODT as needed - AM Lab: CBC, RFP, Mag - Continue to monitor pain and BM Sinus pause Chronic sinus bradycardia First degree AV block Bradycardia with first-degree AV block and intermediate pauses, last sinus pause 11/24. S/p 1 gm Mag - Cardiology consulted, appreciate reccomendations  - Per cardio, ZIO monitor ordered - Atorvastatin  80 mg daily - Outpatient sleep study recommended - Avoid nodal agents HTN (hypertension) Waiting for f/u from Cardiology hydralazine  vs clonidine .  Per cardiology, added hydralazine  25 mg 3 times daily instead of restarting  home clonidine  - Continue home amlodipine  10 daily, isosorbide  20 twice daily - Hydralazine  25 mg TID - D/c metoprolol  - Follow up Echo ESRD (end stage renal disease) on dialysis (HCC) Last HD 11/25 - Appreciate ongoing nephrology recommendations - HD per nephrology Chronic health problem HFpEF: CTM volume status.  Medications as above. GERD: Protonix  40 mg daily  HLD: Crestor  10 mg daily  Secondary hyperparathyroidism: Sensipar  90 mg daily  COPD: home Stiolto Respimat , on formulary Brovana  neb BID and Incruse Ellipta    FEN/GI: Renal diet with 1200 fluid restriction PPx: SCDs, heparin  with dialysis Dispo: Telemetry, barriers to discharge or continued abdominal pain  Subjective:  Laying comfortably in bed on Brovana  nebulizer solution.  Continues to complain of left lower abdominal pain rating it 7/10.  Continues to endorse constipation and agrees to not take Dilaudid  and to continue taking his bowel regimen.  Objective: Temp:  [98.5 F (36.9 C)-99.7 F (37.6 C)] 98.8 F (37.1 C) (11/26 0753) Pulse Rate:  [54-120] 57 (11/26 0552) Resp:  [12-28] 18 (11/26 0753) BP: (123-155)/(66-90) 143/84 (11/26 0753) SpO2:  [95 %-100 %] 98 % (11/26 0552) FiO2 (%):  [21 %] 21 % (11/25 2102) Weight:  [91 kg-91.4 kg] 91.4 kg (11/26 0552)  Physical Exam: General: Well-appearing, no acute distress Cardio: Regular rate, regular rhythm, no murmurs on exam. Pulm: Clear, no wheezing, no crackles. No increased work of breathing Abdominal: Soft, tender, non distended Extremities: no peripheral edema   Laboratory: Most recent CBC Lab Results  Component Value Date   WBC 6.5 12/28/2023   HGB 9.1 (L) 12/30/2023   HCT 27.8 (L) 12/30/2023   MCV 95.3 12/28/2023   PLT 141 (L) 12/28/2023  Most recent BMP    Latest Ref Rng & Units 12/30/2023    4:19 AM  BMP  Glucose 70 - 99 mg/dL 98   BUN 6 - 20 mg/dL 24   Creatinine 9.38 - 1.24 mg/dL 3.18   Sodium 864 - 854 mmol/L 136   Potassium 3.5 - 5.1  mmol/L 4.1   Chloride 98 - 111 mmol/L 98   CO2 22 - 32 mmol/L 25   Calcium  8.9 - 10.3 mg/dL 8.8    Magnesium : 1.9  Elodie Palma, MD 12/30/2023, 7:59 AM  PGY-1, [facility] FPTS Intern pager: [phone], text pages welcome Secure chat group [facility] Teaching Service

## 2023-12-30 NOTE — Care Management Important Message (Signed)
 Important Message  Patient Details  Name: Charles Andringa MRN: [id number] Date of Birth: August 06, 1965   Important Message Given:  Yes - Medicare IM     Vonzell Arrie Sharps 12/30/2023, 1:38 PM

## 2023-12-30 NOTE — Progress Notes (Signed)
 [facility] Progress Note   Subjective:   Tearful, reports he is having a lot of abdominal pain this morning. Denies SOB, dizziness. Reports he did have CP earlier this morning.   Objective Vitals:   12/30/23 0000 12/30/23 0552 12/30/23 0753 12/30/23 0807  BP:  137/89 (!) 143/84   Pulse:  (!) 57    Resp:  18 18   Temp: 98.5 F (36.9 C) 98.8 F (37.1 C) 98.8 F (37.1 C)   TempSrc: Oral Oral Oral   SpO2:  98%  96%  Weight:  91.4 kg    Height:       Physical Exam General: Alert, tearful male Heart: RRR, no murmurs Lungs: CTA bilaterally, respirations unlabored Abdomen: Deferred d/t pain Extremities: No edema b/l lower extremities Dialysis Access: [facility]  Additional Objective Labs: Basic Metabolic Panel: Recent Labs  Lab 12/28/23 0059 12/29/23 0521 12/30/23 0419  NA 134* 134* 136  K 3.9 4.1 4.1  CL 95* 96* 98  CO2 26 25 25   GLUCOSE 95 83 98  BUN 21* 33* 24*  CREATININE 6.84* 8.99* 6.81*  CALCIUM  7.9* 7.9* 8.8*  PHOS 3.4 3.4 2.9   Liver Function Tests: Recent Labs  Lab 12/25/23 0147 12/27/23 0124 12/28/23 0059 12/29/23 0521 12/30/23 0419  AST 21  --   --   --   --   ALT 14  --   --   --   --   ALKPHOS 78  --   --   --   --   BILITOT 1.3*  --   --   --   --   PROT 6.7  --   --   --   --   ALBUMIN  3.2*   < > 2.7* 2.5* 2.5*   < > = values in this interval not displayed.   Recent Labs  Lab 12/25/23 0147  LIPASE 33   CBC: Recent Labs  Lab 12/25/23 0147 12/25/23 0152 12/25/23 0837 12/25/23 1353 12/26/23 0237 12/26/23 0652 12/28/23 0551 12/28/23 1320 12/29/23 0521 12/29/23 1935 12/30/23 0624  WBC 6.7  --  6.5 6.6 5.9  --  6.5  --   --   --   --   HGB 9.6*   < > 7.4* 7.5* 6.1*   < > 8.2*   < > 8.4* 9.4* 9.1*  HCT 30.2*   < > 23.1* 23.4* 19.4*   < > 24.6*   < > 25.8* 28.8* 27.8*  MCV 101.3*  --  101.8* 101.7* 102.1*  --  95.3  --   --   --   --   PLT 176  --  149* 133* 136*  --  141*  --   --   --   --    < > = values in this  interval not displayed.   Blood Culture    Component Value Date/Time   SDES BLOOD SITE NOT SPECIFIED 10/19/2022 1501   SPECREQUEST  10/19/2022 1501    BOTTLES DRAWN AEROBIC AND ANAEROBIC Blood Culture results may not be optimal due to an excessive volume of blood received in culture bottles   CULT  10/19/2022 1501    NO GROWTH 5 DAYS Performed at [facility], [address] [place], [place] [address]    REPTSTATUS 10/24/2022 FINAL 10/19/2022 1501    Cardiac Enzymes: No results for input(s): CKTOTAL, CKMB, CKMBINDEX, TROPONINI in the last 168 hours. CBG: No results for input(s): GLUCAP in the last 168 hours. Iron   Studies: No results for input(s): IRON , TIBC, TRANSFERRIN, FERRITIN in the last 72 hours. @lablastinr3 @ Studies/Results: ECHOCARDIOGRAM COMPLETE Result Date: 12/29/2023    ECHOCARDIOGRAM REPORT   Patient Name:   James Wall [facility] Date of Exam: 12/29/2023 Medical Rec #:  [id number]           Height:       65.0 in Accession #:    [id number]          Weight:       200.6 lb Date of Birth:  [birth year]           BSA:          1.981 m Patient Age:    58 years            BP:           114/86 mmHg Patient Gender: M                   HR:           63 bpm. Exam Location:  Inpatient Procedure: 2D Echo, Cardiac Doppler and Color Doppler (Both Spectral and Color            Flow Doppler were utilized during procedure). Indications:    Heart block, Complete  History:        Patient has prior history of Echocardiogram examinations, most                 recent 08/21/2023. CHF, Signs/Symptoms:Dyspnea; Risk                 Factors:Hypertension and Sleep Apnea.  Sonographer:    Sherlean Dubin Referring Phys: [id number] ZANE ADAMS  Sonographer Comments: Image acquisition challenging due to respiratory motion. IMPRESSIONS  1. Left ventricular ejection fraction, by estimation, is 55 to 60%. The left ventricle has normal function. The left ventricle has no regional wall motion  abnormalities. There is mild concentric left ventricular hypertrophy. Left ventricular diastolic parameters are indeterminate.  2. Right ventricular systolic function is mildly reduced. The right ventricular size is mildly enlarged. Tricuspid regurgitation signal is inadequate for assessing PA pressure. The estimated right ventricular systolic pressure is 25.3 mmHg.  3. Left atrial size was mildly dilated.  4. The mitral valve is grossly normal. Trivial mitral valve regurgitation. No evidence of mitral stenosis.  5. The aortic valve is tricuspid. Aortic valve regurgitation is not visualized. Mild aortic valve stenosis. Aortic valve area, by VTI measures 1.09 cm. Aortic valve mean gradient measures 17.3 mmHg. Aortic valve Vmax measures 2.78 m/s.  6. The inferior vena cava is normal in size with greater than 50% respiratory variability, suggesting right atrial pressure of 3 mmHg. FINDINGS  Left Ventricle: Left ventricular ejection fraction, by estimation, is 55 to 60%. The left ventricle has normal function. The left ventricle has no regional wall motion abnormalities. The left ventricular internal cavity size was normal in size. There is  mild concentric left ventricular hypertrophy. Left ventricular diastolic parameters are indeterminate. Right Ventricle: The right ventricular size is mildly enlarged. No increase in right ventricular wall thickness. Right ventricular systolic function is mildly reduced. Tricuspid regurgitation signal is inadequate for assessing PA pressure. The tricuspid regurgitant velocity is 2.36 m/s, and with an assumed right atrial pressure of 3 mmHg, the estimated right ventricular systolic pressure is 25.3 mmHg. Left Atrium: Left atrial size was mildly dilated. Right Atrium: Right atrial size was normal in size. Pericardium: There is no evidence of pericardial effusion. Mitral Valve: The  mitral valve is grossly normal. Trivial mitral valve regurgitation. No evidence of mitral valve stenosis.  Tricuspid Valve: The tricuspid valve is grossly normal. Tricuspid valve regurgitation is mild . No evidence of tricuspid stenosis. Aortic Valve: The aortic valve is tricuspid. Aortic valve regurgitation is not visualized. Mild aortic stenosis is present. Aortic valve mean gradient measures 17.3 mmHg. Aortic valve peak gradient measures 30.8 mmHg. Aortic valve area, by VTI measures 1.09 cm. Pulmonic Valve: The pulmonic valve was grossly normal. Pulmonic valve regurgitation is not visualized. No evidence of pulmonic stenosis. Aorta: The aortic root and ascending aorta are structurally normal, with no evidence of dilitation. Venous: The inferior vena cava is normal in size with greater than 50% respiratory variability, suggesting right atrial pressure of 3 mmHg. IAS/Shunts: The atrial septum is grossly normal.  LEFT VENTRICLE PLAX 2D LVIDd:         4.80 cm   Diastology LVIDs:         3.80 cm   LV e' medial:    8.05 cm/s LV PW:         1.20 cm   LV E/e' medial:  14.0 LV IVS:        1.20 cm   LV e' lateral:   9.68 cm/s LVOT diam:     1.80 cm   LV E/e' lateral: 11.7 LV SV:         54 LV SV Index:   27 LVOT Area:     2.54 cm  RIGHT VENTRICLE             IVC RV Basal diam:  3.50 cm     IVC diam: 1.80 cm RV Mid diam:    3.50 cm RV S prime:     13.30 cm/s TAPSE (M-mode): 1.6 cm LEFT ATRIUM             Index        RIGHT ATRIUM           Index LA diam:        2.90 cm 1.46 cm/m   RA Area:     20.70 cm LA Vol (A2C):   73.9 ml 37.31 ml/m  RA Volume:   62.40 ml  31.50 ml/m LA Vol (A4C):   72.8 ml 36.75 ml/m LA Biplane Vol: 80.1 ml 40.44 ml/m  AORTIC VALVE AV Area (Vmax):    1.14 cm AV Area (Vmean):   1.14 cm AV Area (VTI):     1.09 cm AV Vmax:           277.59 cm/s AV Vmean:          195.073 cm/s AV VTI:            0.493 m AV Peak Grad:      30.8 mmHg AV Mean Grad:      17.3 mmHg LVOT Vmax:         124.00 cm/s LVOT Vmean:        87.300 cm/s LVOT VTI:          0.212 m LVOT/AV VTI ratio: 0.43  AORTA Ao Root diam: 3.10 cm  Ao Asc diam:  3.30 cm MITRAL VALVE                TRICUSPID VALVE MV Area (PHT): 2.99 cm     TR Peak grad:   22.3 mmHg MV Decel Time: 254 msec     TR Vmax:        236.00 cm/s MV E  velocity: 113.00 cm/s                             SHUNTS                             Systemic VTI:  0.21 m                             Systemic Diam: 1.80 cm Darryle Decent MD Electronically signed by Darryle Decent MD Signature Date/Time: 12/29/2023/4:08:28 PM    Final    CT ABDOMEN PELVIS W CONTRAST Result Date: 12/28/2023 CLINICAL DATA:  Severe left-sided pain post embolization EXAM: CT ABDOMEN AND PELVIS WITH CONTRAST TECHNIQUE: Multidetector CT imaging of the abdomen and pelvis was performed using the standard protocol following bolus administration of intravenous contrast. RADIATION DOSE REDUCTION: This exam was performed according to the departmental dose-optimization program which includes automated exposure control, adjustment of the mA and/or kV according to patient size and/or use of iterative reconstruction technique. CONTRAST:  75mL OMNIPAQUE  IOHEXOL  350 MG/ML SOLN COMPARISON:  CT 12/25/2023, 12/05/2023, 10/19/2022, 04/29/2022 FINDINGS: Lower chest: Lung bases demonstrate small bilateral pleural effusions, this is slightly increased on the left side. Increased partial consolidation in the left lower lobe. Large cyst or bleb at the left base as before. Cardiomegaly. Coronary vascular calcification. Small hiatal hernia. Hepatobiliary: No calcified gallstone. Mild hyperdensity within the gallbladder lumen, could reflect mild by carious excretion of contrast or small amount of hyperdense sludge. No biliary dilatation. No focal hepatic abnormality. Pancreas: No acute inflammatory process. Tail of pancreas is slightly displaced anteriorly. No ductal dilatation Spleen: Normal in size without focal abnormality. Adrenals/Urinary Tract: Adrenal glands are normal. Atrophic kidneys with multiple simple and complex cysts as before.  Possible left renal neoplasm as was seen on prior exams is obscured by large volume left perinephric hematoma. Thin residual left renal cortex is probably hypo enhancing but limited by atrophy, presence of cysts and perinephric hematoma. Bladder is decompressed Stomach/Bowel: The stomach is normal. Mild wall thickening suspected of the descending colon compared to prior. No dilated small bowel. Vascular/Lymphatic: Aortic atherosclerosis. No enlarged abdominal or pelvic lymph nodes. Reproductive: Fiducial markers. Other: No free air. Slight increased mildly dense free fluid in the abdomen and pelvis. Increased anasarca and left greater than right flank edema. Musculoskeletal: No acute finding. Mild diffuse sclerosis consistent with chronic kidney disease. IMPRESSION: 1. Interval left renal artery embolization. Redemonstrated large left perinephric hematoma but stable to slightly decreased in size. Residual left retroperitoneal hematoma more dense in the interim consistent with organizing hematoma, but overall felt slightly decreased in size compared to prior. Previously noted left midpole extravasation no longer visualized. Small volume gas midpole left kidney probably post emobolization changes. Suspect that there is diffuse hypoenahcement of the thin rind of residual left renal cortex as may be seen with infarct but limited due to atrophy, multiple cysts and presence of large hemtoma. 2. Slight increased slightly dense abdominopelvic ascites but no extravasation identified in the abdomen. 3. Mild wall thickening of the descending colon compared to prior,? Colitis. 4. Small bilateral pleural effusions, slightly increased on the left side. Increased partial consolidation in the left lower lobe, atelectasis versus pneumonia. 5. Increased anasarca and left greater than right flank edema. 6. Aortic atherosclerosis. Aortic Atherosclerosis (ICD10-I70.0). Electronically Signed   By: Luke Scott HERO.D.  On: 12/28/2023  16:33   Medications:   acetaminophen   500 mg Oral Q6H   amLODipine   10 mg Oral QHS   arformoterol   15 mcg Nebulization BID   And   umeclidinium bromide   1 puff Inhalation Daily   atorvastatin   80 mg Oral Daily   Chlorhexidine  Gluconate Cloth  6 each Topical Daily   Chlorhexidine  Gluconate Cloth  6 each Topical Q0600   cinacalcet   150 mg Oral Once per day on Sunday Tuesday Friday   darbepoetin (ARANESP ) injection - DIALYSIS  150 mcg Subcutaneous Q Sun-1800   doxercalciferol   4 mcg Intravenous Once per day on Sunday Tuesday Friday   ferric citrate   630 mg Oral TID WC   heparin  sodium (porcine)  3,200 Units Intracatheter Once   hydrALAZINE   25 mg Oral Q8H   isosorbide  dinitrate  20 mg Oral BID   loratadine   10 mg Oral Daily   pantoprazole   40 mg Oral Daily   polyethylene glycol  17 g Oral Daily   senna  2 tablet Oral Daily    Dialysis Orders: [facility] M[facility] 4h  B400  86.7kg  2K bath  TDC  Heparin  4500 + 2000 midrun Last OP HD 11/19, post wt 90.8kg (+4kg) Mircera 150mcg q2wks - last 10/27 Hectorol  4mcg qHD Sensipar  120mg  qHD Usually doesn't get to dry wt, come off 1-6 kg over  Signs off early at 2.5- 3.5 hrs usually  Assessment/Plan: L renal/ perinephric bleed: w/ assoc RP hemorrhage. CTA showed extravasation and IR did L renal artery embolization procedure on 12/25/23. Pain ongoing. Pain managed by PMD.  ESRD: on HD MWF. Due to Holiday schedule will run on Sunday, Tuesday and Friday this week.   HTN: elevated today but overall improved, continue home meds and UF with HD as tolerated Volume: CXR 11/21 showed pulm edema.O2 requirement has improved, now on RA. Weights still up but not sure if accurate, will try to get a bed weight when pain is controlled.  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 ESA started Sunday Secondary hyperparathyroidism - Ca and phos in goal. Continue home meds. Nutrition - renal diet with fluid restrictions  Lucie Collet, PA-C 12/30/2023, 9:19 AM  [facility] Pager: [phone]

## 2023-12-30 NOTE — TOC Progression Note (Addendum)
 Transition of Care [facility]) - Progression Note    Patient Details  Name: James Wall MRN: [id number] Date of Birth: 12-03-1965  Transition of Care [facility]) CM/SW Contact  Isaiah Public, LCSWA Phone Number: 12/30/2023, 12:43 PM  Clinical Narrative:     CSW followed up with patient on dc plan. Patient discussed plan with sister. Patient reports his sister will not be able to stay with him 24/7 when he dc's home.Patient confirmed he wants to return home with [facility] PT when medically ready. Patient expressed understanding that he only ambulated 4 ft yesterday.Patient is hopeful that PT can work with him more to get him strong enough to return home. [facility] informed CSW that they plan on working with patient today. CM informed patient wants to return home when medically ready. TOC will continue to follow.  Expected Discharge Plan:  (home with hh PT) Barriers to Discharge: Continued Medical Work up               Expected Discharge Plan and Services In-house Referral: Clinical Social Work     Living arrangements for the past 2 months: Single Family Home                                       Social Drivers of Health (SDOH) Interventions SDOH Screenings   Food Insecurity: No Food Insecurity (12/25/2023)  Housing: Low Risk  (12/25/2023)  Transportation Needs: No Transportation Needs (12/25/2023)  Utilities: Not At Risk (12/25/2023)  Alcohol Screen: Low Risk  (08/27/2023)  Depression (PHQ2-9): Medium Risk (12/24/2023)  Financial Resource Strain: Low Risk  (08/27/2023)  Physical Activity: Inactive (08/27/2023)  Social Connections: Moderately Isolated (08/27/2023)  Stress: No Stress Concern Present (08/05/2022)  Tobacco Use: Medium Risk (12/25/2023)  Health Literacy: Adequate Health Literacy (08/27/2023)    Readmission Risk Interventions     No data to display

## 2023-12-30 NOTE — Progress Notes (Signed)
  Progress Note  Patient Name: James Wall Date of Encounter: 12/30/2023 [facility] Cardiologist: Stanly DELENA Leavens, MD   Interval Summary   States that his stomach is upset.  Has not had a bowel movement in quite some time.  Tearful.  Wants a better quality of life.  Has been on hemodialysis for 16 years.  Vital Signs Vitals:   12/30/23 0000 12/30/23 0552 12/30/23 0753 12/30/23 0807  BP:  137/89 (!) 143/84   Pulse:  (!) 57    Resp:  18 18   Temp: 98.5 F (36.9 C) 98.8 F (37.1 C) 98.8 F (37.1 C)   TempSrc: Oral Oral Oral   SpO2:  98%  96%  Weight:  91.4 kg    Height:        Intake/Output Summary (Last 24 hours) at 12/30/2023 0952 Last data filed at 12/30/2023 9371 Gross per 24 hour  Intake 780 ml  Output 3000 ml  Net -2220 ml      12/30/2023    5:52 AM 12/29/2023   11:32 AM 12/29/2023    7:44 AM  Last 3 Weights  Weight (lbs) 201 lb 8 oz 200 lb 9.9 oz 204 lb 2.3 oz  Weight (kg) 91.4 kg 91 kg 92.6 kg      Telemetry/ECG  As described below personally reviewed  Physical Exam  GEN: No acute distress.  Crying at times Neck: No JVD Cardiac: RRR, no murmurs, rubs, or gallops.  Respiratory: Clear to auscultation bilaterally. GI: Soft, nontender, non-distended  MS: No edema  Assessment & Plan   58 year old on hemodialysis with transient AV block secondary to increased vagal tone from untreated sleep apnea  -Telemetry reviewed and he had a another 5.4-second pause at approximately 9 AM this morning during sleep asymptomatic. -Metoprolol  has been stopped. -He also has issues with atrial tachycardia at times as well.  Challenging situation with tachycardia-bradycardia syndrome. -He would not be a good candidate for pacemaker given possibility of infection risks given his hemodialysis which he has been receiving for the past 16 years. -Recommend continued supportive care at this point.  We will go ahead and sign off.  Please let us  know if  we can be of further assistance.  For questions or updates, please contact  [facility] Please consult [email or web address] for contact info under         Signed, Oneil Parchment, MD

## 2023-12-30 NOTE — Assessment & Plan Note (Addendum)
 Waiting for f/u from Cardiology hydralazine  vs clonidine .  Per cardiology, added hydralazine  25 mg 3 times daily instead of restarting home clonidine  - Continue home amlodipine  10 daily, isosorbide  20 twice daily - Hydralazine  25 mg TID - D/c metoprolol  - Follow up Echo

## 2023-12-30 NOTE — Assessment & Plan Note (Addendum)
 S/p L renal artery embolization 11/21. H&H stable.  Patient agrees with stop Dilaudid  and states oxy historically extreme nausea and vomiting. Communicate with nursing no IV dilaudid .  - H&H daily - Adjust pain regiment to Norco 5-325 1 tab q6 PRN - Simethicone  as needed added for bloating/flatulence - Bowel regimen: Fleet Enema with bisocodyl 5 mg x1 - Continue Zofran  ODT as needed - AM Lab: CBC, RFP, Mag - Continue to monitor pain and BM

## 2023-12-30 NOTE — Assessment & Plan Note (Signed)
 HFpEF: CTM volume status.  Medications as above. GERD: Protonix  40 mg daily  HLD: Crestor  10 mg daily  Secondary hyperparathyroidism: Sensipar  90 mg daily  COPD: home Stiolto Respimat , on formulary Brovana  neb BID and Incruse Ellipta 

## 2023-12-30 NOTE — Assessment & Plan Note (Addendum)
 Last HD 11/25 - Appreciate ongoing nephrology recommendations - HD per nephrology

## 2023-12-30 NOTE — Plan of Care (Signed)

## 2023-12-30 NOTE — Progress Notes (Signed)
 Mobility Specialist Progress Note:    12/30/23 1500  Mobility  Activity Ambulated with assistance  Level of Assistance Contact guard assist, steadying assist  Assistive Device Front wheel walker  Distance Ambulated (ft) 125 ft (x2)  Activity Response Tolerated well  Mobility Referral Yes  Mobility visit 1 Mobility  Mobility Specialist Start Time (ACUTE ONLY) 1415  Mobility Specialist Stop Time (ACUTE ONLY) 1430  Mobility Specialist Time Calculation (min) (ACUTE ONLY) 15 min   Pt received in chair agreeable to mobility. No physical assistance needed, contact guard for safety. Took x1 seated rest break d/t fatigue and HR reaching 140. Returned to room w/o fault. Left in bed w/ call bell and personal belongings in reach. All needs met.   Thersia Minder Mobility Specialist  Please contact vis Secure Chat or  Rehab Office [phone]

## 2023-12-31 DIAGNOSIS — S37019A Minor contusion of unspecified kidney, initial encounter: Secondary | ICD-10-CM | POA: Diagnosis not present

## 2023-12-31 LAB — RENAL FUNCTION PANEL
Albumin: 2.5 g/dL — ABNORMAL LOW (ref 3.5–5.0)
Anion gap: 14 (ref 5–15)
BUN: 33 mg/dL — ABNORMAL HIGH (ref 6–20)
CO2: 26 mmol/L (ref 22–32)
Calcium: 9 mg/dL (ref 8.9–10.3)
Chloride: 95 mmol/L — ABNORMAL LOW (ref 98–111)
Creatinine, Ser: 8.54 mg/dL — ABNORMAL HIGH (ref 0.61–1.24)
GFR, Estimated: 7 mL/min — ABNORMAL LOW (ref >60)
Glucose, Bld: 91 mg/dL (ref 70–99)
Phosphorus: 3.7 mg/dL (ref 2.5–4.6)
Potassium: 4.4 mmol/L (ref 3.5–5.1)
Sodium: 135 mmol/L (ref 135–145)

## 2023-12-31 LAB — CBC
HCT: 29.1 % — ABNORMAL LOW (ref 39.0–52.0)
Hemoglobin: 9.4 g/dL — ABNORMAL LOW (ref 13.0–17.0)
MCH: 31.9 pg (ref 26.0–34.0)
MCHC: 32.3 g/dL (ref 30.0–36.0)
MCV: 98.6 fL (ref 80.0–100.0)
Platelets: 165 K/uL (ref 150–400)
RBC: 2.95 MIL/uL — ABNORMAL LOW (ref 4.22–5.81)
RDW: 19.6 % — ABNORMAL HIGH (ref 11.5–15.5)
WBC: 6.3 K/uL (ref 4.0–10.5)
nRBC: 0 % (ref 0.0–0.2)

## 2023-12-31 LAB — MAGNESIUM: Magnesium: 2.2 mg/dL (ref 1.7–2.4)

## 2023-12-31 MED ORDER — BISACODYL 5 MG PO TBEC
5.0000 mg | DELAYED_RELEASE_TABLET | Freq: Once | ORAL | Status: AC
  Administered 2023-12-31: 5 mg via ORAL
  Filled 2023-12-31: qty 1

## 2023-12-31 NOTE — Assessment & Plan Note (Addendum)
 Bradycardia with first-degree AV block and intermediate pauses, last sinus pause 11/24. - Cardiology consulted, appreciate reccomendations  - ZIO monitor ordered - Continue atorvastatin  80 mg daily - Outpatient sleep study recommended - Avoid nodal agents

## 2023-12-31 NOTE — Progress Notes (Signed)
 Daily Progress Note Intern Pager: [phone]  Patient name: James Wall [facility] Medical record number: [id number] Date of birth: 11-12-1965 Age: 58 y.o. Gender: male  Primary Care Provider: Janna Ferrier, DO Consultants: PCCM, IR, nephrology, urology Code Status: Full  Pt Overview and Major Events to Date:  11/21: Admitted to ICU, IR embolization 11/22: Transferred to FMTS 11/23: HD 11/25: HD  James Wall is a 58 year old male with past medical history of ESRD on HD, cirrhosis, prostate cancer, anxiety, arthritis, CHF, COPD, DM, PSVT, and CVA who presented with abdominal pain secondary to large left perinephric hematoma with extensive retroperitoneal hemorrhage status post left renal embolization and ICU stay, transferred to FMTS on 11/22.   Hospital course has been complicated by persistent abdominal pain in the setting of constipation.  S/p bisacodyl , senna, and Fleet enema.  Added Norco 5-3 25 every 6 as needed for abdominal pain. Pending discharge home tomorrow if patient has successful bowel movement.  Assessment & Plan Stable Perinephric hematoma Abdominal pain S/p L renal artery embolization 11/21. H&H has remained stable throughout hospital course.  - Continue daily H&H  - Continue pain control with Norco 5-325 1 tab q6 PRN - Continue Simethicone  for bloating/flatulence - Continue bowel regimen: Fleet enema with bisacodyl  5 mg PRN; offered Soap Suds enema this AM and patient refused as he does not want to soil the bed, reordered bisacodyl  5mg  - Continue Zofran  ODT as needed - AM Lab: CBC, RFP, Mag - Continue to monitor pain and BM Sinus pause Chronic sinus bradycardia First degree AV block Bradycardia with first-degree AV block and intermediate pauses, last sinus pause 11/24. - Cardiology consulted, appreciate reccomendations  - ZIO monitor ordered - Continue atorvastatin  80 mg daily - Outpatient sleep study recommended - Avoid nodal agents HTN  (hypertension) Per cardiology, added hydralazine  25 mg 3 times daily instead of restarting home clonidine  patch at this time.  - Continue home amlodipine  10 daily - Continue home isosorbide  20 twice daily - Continue hydralazine  25 mg TID - D/c metoprolol , no clonidine  patch at this time  - Follow up Echo with LVEF of 55-60% ESRD (end stage renal disease) on dialysis (HCC) Last HD 11/25, due to holiday schedule next HD planned for 11/28. - Appreciate ongoing nephrology recommendations Chronic health problem HFpEF: CTM volume status.  Medications as above. GERD: Protonix  40 mg daily  HLD: Crestor  10 mg daily  Secondary hyperparathyroidism: Sensipar  90 mg daily  COPD: home Stiolto Respimat , on formulary Brovana  neb BID and Incruse Ellipta   FEN/GI: Renal diet with fluid restriction PPx: SCDs, heparin  with dialysis Dispo:Home tomorrow. Barriers include needing dialysis inpatient and outpatient setup.   Subjective:  Patient was seen and examined at bedside. He states he had a small bowel movement overnight. He states his abdominal pain is better however he still feels firm on the L side of his abdomen. He becomes tearful during interaction about his health.   Objective: Temp:  [97.6 F (36.4 C)-99 F (37.2 C)] 99 F (37.2 C) (11/27 0435) Pulse Rate:  [100] 100 (11/26 1941) Resp:  [9-20] 16 (11/27 0435) BP: (108-153)/(70-94) 153/80 (11/27 0435) SpO2:  [94 %-99 %] 94 % (11/27 0435) FiO2 (%):  [21 %] 21 % (11/26 2044) Weight:  [86.9 kg] 86.9 kg (11/27 0435) Physical Exam: General: comfortably lying supine in hospital bed, in no acute distress Cardiovascular: RRR, no m/g/r Respiratory: CTAB, normal WOB on RA Abdomen: firm L>R, mild diffuse tenderness, mildly distended, BS present Extremities: trace pitting  edema to LLE  Laboratory: Most recent CBC Lab Results  Component Value Date   WBC 6.3 12/31/2023   HGB 9.4 (L) 12/31/2023   HCT 29.1 (L) 12/31/2023   MCV 98.6  12/31/2023   PLT 165 12/31/2023   Most recent BMP    Latest Ref Rng & Units 12/31/2023    3:44 AM  BMP  Glucose 70 - 99 mg/dL 91   BUN 6 - 20 mg/dL 33   Creatinine 9.38 - 1.24 mg/dL 1.45   Sodium 864 - 854 mmol/L 135   Potassium 3.5 - 5.1 mmol/L 4.4   Chloride 98 - 111 mmol/L 95   CO2 22 - 32 mmol/L 26   Calcium  8.9 - 10.3 mg/dL 9.0    Lupie Credit, DO 12/31/2023, 7:31 AM  PGY-1, [facility] FPTS Intern pager: [phone], text pages welcome Secure chat group [facility] Teaching Service

## 2023-12-31 NOTE — Assessment & Plan Note (Addendum)
 Per cardiology, added hydralazine  25 mg 3 times daily instead of restarting home clonidine  patch at this time.  - Continue home amlodipine  10 daily - Continue home isosorbide  20 twice daily - Continue hydralazine  25 mg TID - D/c metoprolol , no clonidine  patch at this time  - Follow up Echo with LVEF of 55-60%

## 2023-12-31 NOTE — Discharge Summary (Shared)
 Family Medicine Teaching [facility] Discharge Summary  Patient name: James Wall Medical record number: [id number] Date of birth: 12-07-1965 Age: 58 y.o. Gender: male Date of Admission: 12/25/2023  Date of Discharge: *** Admitting Physician: Harlene Na, DO  Primary Care Provider: Janna Ferrier, DO Consultants: ***  Indication for Hospitalization: ***  Brief Hospital Course:  James Wall is a 58 y.o. male w/PMHx of ESRD on HD, chronic HFrEF, HTN, COPD, prostate cancer, liver cirrhosis, who was admitted for a large left perinephric hematoma w/ active extravasation now s/p embolization of vessel by IR on 11/21.   Perinephric Hematoma:  Presented with abdominal pain to the ED. Hgb on admission 9.6 and down-trending to 6.1 requiring 1 unit transfusion with improvement to 7.1. CT AP significant for large left perinephric hematoma with extravasation and extensive retroperitoneal hemorrhage due to spontaneous rupture of left renal neoplasm. He underwent an IR embolization of the left renal artery on 11/21 and was admitted to the ICU for monitoring s/p procedure. After procedure and stabilization, patient was transferred to FMTS on 11/22. Hospital course was complicated by persistent abdominal pain that the patient continued to use Dilaudid  for which subsequently caused constipation. Abdominal pain had become severe with severe tenderness to palpation on abdominal exam on 11/24. A STAT CT AP with contrast was ordered and showed stable and slightly decreased in size L retroperitoneal hematoma and small volume gas midpole in the L kidney post embolization changes, slight increase in abdominopelvic ascites without extravasation, mild wall thickening of the descending colon, L > R small bilateral pleural effusions, increased anasarca and L ? R flank edema. Pain regimen was tapered down to Norco 5-325mg  q6h PRN and patient had pain relief by time of discharge with pain medication  and treatment of constipation.   Sinus pause Chronic sinus bradycardia The patient experienced a 5.33 second asymptomatic pause the morning of 11/24. Metoprolol  was held. Bradycardia remained consistent w/HR in the 40-50s. Cardiology was consulted with diagnosis of high degree AV block secondary to OSA and recommendations to discontinue metoprolol  50mg  BID, place a ZIO monitor to look for any signals of either further pauses of PSVT off of metoprolol  outpatient, and obtain outpatient sleep study. Other cardiology recommendations include increasing Crestor  to 20mg  daily for multivessel CAD and adding hydralazine  25mg  TID. The patient had another 5.4 second pause the morning of 11/26 which was also asymptomatic. Cardiology stated he is not a good pacemaker candidate given possibility of infection risks due to his HD and recommended to continue supportive care.   Hypertension Patient blood pressures fluctuated from soft to mildly elevated throughout hospital course. His amlodipine  10mg  daily, metoprolol  50mg  BID, and isosorbide  20mg  BID were continued and his clonidine  patch and hydralazine  were initially held. Hydralazine  was added back by cardiology as above.   ESRD Nephrology was consulted and M/W/F dialysis was continued while patient was admitted.   PCP follow up:  Outpatient sleep study  FU Zio patch Follow up with urology   Monitor H/H  Discharge Diagnoses/Problem List:  @FAMMDPOC @   ***  Disposition: ***  Discharge Condition: ***  Discharge Exam: ***  Issues for Follow Up:  1. ***  Significant Procedures: ***  Significant Labs and Imaging:  Recent Labs  Lab 12/29/23 1935 12/30/23 0624 12/31/23 0344  WBC  --   --  6.3  HGB 9.4* 9.1* 9.4*  HCT 28.8* 27.8* 29.1*  PLT  --   --  165   Recent Labs  Lab 12/30/23  0419 12/31/23 0344  NA 136 135  K 4.1 4.4  CL 98 95*  CO2 25 26  GLUCOSE 98 91  BUN 24* 33*  CREATININE 6.81* 8.54*  CALCIUM  8.8* 9.0  MG 1.9 2.2   PHOS 2.9 3.7  ALBUMIN  2.5* 2.5*    ***  Results/Tests Pending at Time of Discharge: ***  Discharge Medications:  Allergies as of 12/31/2023       Reactions   Venofer   [ferric Oxide] Itching   Aspirin  Other (See Comments)   Avoids due to renal disease;  Made congestion worse     Med Rec must be completed prior to using this [facility]***       Discharge Instructions: Please refer to Patient Instructions section of EMR for full details.  Patient was counseled important signs and symptoms that should prompt return to medical care, changes in medications, dietary instructions, activity restrictions, and follow up appointments.   Follow-Up Appointments:  Follow-up Information     [facility] Follow up.   Contact information: [address] [place] [address] [place]  [address] [phone]                Lupie Credit, DO 12/31/2023, 1:54 PM PGY-1, [facility]

## 2023-12-31 NOTE — Plan of Care (Signed)
  Problem: Education: Goal: Knowledge of General Education information will improve Description: Including pain rating scale, medication(s)/side effects and non-pharmacologic comfort measures Outcome: Progressing   Problem: Health Behavior/Discharge Planning: Goal: Ability to manage health-related needs will improve Outcome: Progressing   Problem: Clinical Measurements: Goal: Will remain free from infection Outcome: Progressing Goal: Cardiovascular complication will be avoided Outcome: Progressing   Problem: Activity: Goal: Risk for activity intolerance will decrease Outcome: Progressing   Problem: Nutrition: Goal: Adequate nutrition will be maintained Outcome: Progressing   Problem: Coping: Goal: Level of anxiety will decrease Outcome: Progressing   Problem: Skin Integrity: Goal: Risk for impaired skin integrity will decrease Outcome: Progressing   Problem: Activity: Goal: Ability to return to baseline activity level will improve Outcome: Progressing   Problem: Cardiovascular: Goal: Ability to achieve and maintain adequate cardiovascular perfusion will improve Outcome: Progressing

## 2023-12-31 NOTE — Progress Notes (Signed)
 Mobility Specialist: Progress Note   12/31/23 1200  Mobility  Activity Ambulated with assistance  Level of Assistance Standby assist, set-up cues, supervision of patient - no hands on  Assistive Device Front wheel walker  Distance Ambulated (ft) 150 ft  Activity Response Tolerated well  Mobility Referral Yes  Mobility visit 1 Mobility  Mobility Specialist Start Time (ACUTE ONLY) 1105  Mobility Specialist Stop Time (ACUTE ONLY) 1124  Mobility Specialist Time Calculation (min) (ACUTE ONLY) 19 min    Pt received in bed, very pleasant and agreeable to mobility session. C/o stomach pain while sitting up and ambulating, describe the pain as if his stomach was being stretched. MinA to boost to stand. SV for ambulation. Chair follow taken but not utilized. Ambulated to the end of the nursing station and back without fault. Returned to room and wanted to sit up in his chair for a little while. Left in chair with all needs met, call bell in reach.   Ileana Lute Mobility Specialist Please contact via SecureChat or Rehab office at [phone]

## 2023-12-31 NOTE — Assessment & Plan Note (Addendum)
 S/p L renal artery embolization 11/21. H&H has remained stable throughout hospital course.  - Continue daily H&H  - Continue pain control with Norco 5-325 1 tab q6 PRN - Continue Simethicone  for bloating/flatulence - Continue bowel regimen: Fleet enema with bisacodyl  5 mg PRN; offered Soap Suds enema this AM and patient refused as he does not want to soil the bed, reordered bisacodyl  5mg  - Continue Zofran  ODT as needed - AM Lab: CBC, RFP, Mag - Continue to monitor pain and BM

## 2023-12-31 NOTE — Assessment & Plan Note (Addendum)
 Last HD 11/25, due to holiday schedule next HD planned for 11/28. - Appreciate ongoing nephrology recommendations

## 2023-12-31 NOTE — Discharge Instructions (Addendum)
 Dear James Wall,  Thank you for letting us  participate in your care. You were hospitalized for and diagnosed with Perinephric hematoma. You were treated with an embolization of your perinephric hematoma and 1 unit of blood.   POST-HOSPITAL & CARE INSTRUCTIONS Follow up with nephrology and your PCP  Go to your follow up appointments (listed below)  DOCTOR'S APPOINTMENT   Future Appointments  Date Time Provider Department Center  01/07/2024  9:00 AM Santo Stanly LABOR, MD [facility]    Follow-up Information     [facility] Follow up.   Contact information: [address] [place]  [address] [phone]                Take care and be well!  Family Medicine Teaching Service Inpatient Team [place]  [employer]  [address] [place], [place] [address] [phone]

## 2023-12-31 NOTE — Assessment & Plan Note (Addendum)
 HFpEF: CTM volume status.  Medications as above. GERD: Protonix  40 mg daily  HLD: Crestor  10 mg daily  Secondary hyperparathyroidism: Sensipar  90 mg daily  COPD: home Stiolto Respimat , on formulary Brovana  neb BID and Incruse Ellipta 

## 2024-01-01 ENCOUNTER — Other Ambulatory Visit (HOSPITAL_COMMUNITY): Payer: Self-pay

## 2024-01-01 DIAGNOSIS — S37019A Minor contusion of unspecified kidney, initial encounter: Secondary | ICD-10-CM | POA: Diagnosis not present

## 2024-01-01 LAB — CBC
HCT: 28.2 % — ABNORMAL LOW (ref 39.0–52.0)
HCT: 27.5 % — ABNORMAL LOW (ref 39.0–52.0)
Hemoglobin: 9.1 g/dL — ABNORMAL LOW (ref 13.0–17.0)
Hemoglobin: 8.8 g/dL — ABNORMAL LOW (ref 13.0–17.0)
MCH: 31.6 pg (ref 26.0–34.0)
MCH: 31.5 pg (ref 26.0–34.0)
MCHC: 32.3 g/dL (ref 30.0–36.0)
MCHC: 32 g/dL (ref 30.0–36.0)
MCV: 97.9 fL (ref 80.0–100.0)
MCV: 98.6 fL (ref 80.0–100.0)
Platelets: 188 K/uL (ref 150–400)
Platelets: 196 K/uL (ref 150–400)
RBC: 2.88 MIL/uL — ABNORMAL LOW (ref 4.22–5.81)
RBC: 2.79 MIL/uL — ABNORMAL LOW (ref 4.22–5.81)
RDW: 19.1 % — ABNORMAL HIGH (ref 11.5–15.5)
RDW: 18.9 % — ABNORMAL HIGH (ref 11.5–15.5)
WBC: 7.7 K/uL (ref 4.0–10.5)
WBC: 7.7 K/uL (ref 4.0–10.5)
nRBC: 0 % (ref 0.0–0.2)
nRBC: 0 % (ref 0.0–0.2)

## 2024-01-01 LAB — RENAL FUNCTION PANEL
Albumin: 2.5 g/dL — ABNORMAL LOW (ref 3.5–5.0)
Albumin: 2.5 g/dL — ABNORMAL LOW (ref 3.5–5.0)
Anion gap: 12 (ref 5–15)
Anion gap: 14 (ref 5–15)
BUN: 43 mg/dL — ABNORMAL HIGH (ref 6–20)
BUN: 45 mg/dL — ABNORMAL HIGH (ref 6–20)
CO2: 26 mmol/L (ref 22–32)
CO2: 26 mmol/L (ref 22–32)
Calcium: 9.2 mg/dL (ref 8.9–10.3)
Calcium: 9.2 mg/dL (ref 8.9–10.3)
Chloride: 96 mmol/L — ABNORMAL LOW (ref 98–111)
Chloride: 95 mmol/L — ABNORMAL LOW (ref 98–111)
Creatinine, Ser: 10.82 mg/dL — ABNORMAL HIGH (ref 0.61–1.24)
Creatinine, Ser: 11.16 mg/dL — ABNORMAL HIGH (ref 0.61–1.24)
GFR, Estimated: 5 mL/min — ABNORMAL LOW (ref >60)
GFR, Estimated: 5 mL/min — ABNORMAL LOW (ref >60)
Glucose, Bld: 89 mg/dL (ref 70–99)
Glucose, Bld: 83 mg/dL (ref 70–99)
Phosphorus: 3.5 mg/dL (ref 2.5–4.6)
Phosphorus: 3.6 mg/dL (ref 2.5–4.6)
Potassium: 4.9 mmol/L (ref 3.5–5.1)
Potassium: 5 mmol/L (ref 3.5–5.1)
Sodium: 134 mmol/L — ABNORMAL LOW (ref 135–145)
Sodium: 135 mmol/L (ref 135–145)

## 2024-01-01 LAB — MAGNESIUM: Magnesium: 2.1 mg/dL (ref 1.7–2.4)

## 2024-01-01 MED ORDER — HYDROCODONE-ACETAMINOPHEN 5-325 MG PO TABS
1.0000 | ORAL_TABLET | Freq: Four times a day (QID) | ORAL | 0 refills | Status: DC | PRN
  Filled 2024-01-01: qty 5, 2d supply, fill #0

## 2024-01-01 MED ORDER — POLYETHYLENE GLYCOL 3350 17 G PO PACK
17.0000 g | PACK | Freq: Every day | ORAL | Status: DC
  Filled 2024-01-01 (×2): qty 1

## 2024-01-01 MED ORDER — HYDRALAZINE HCL 25 MG PO TABS
25.0000 mg | ORAL_TABLET | Freq: Three times a day (TID) | ORAL | 0 refills | Status: AC
  Filled 2024-01-01: qty 90, 30d supply, fill #0

## 2024-01-01 MED ORDER — POLYETHYLENE GLYCOL 3350 17 GM/SCOOP PO POWD
17.0000 g | Freq: Every day | ORAL | 0 refills | Status: DC
  Filled 2024-01-01: qty 238, 14d supply, fill #0

## 2024-01-01 MED ORDER — CINACALCET HCL 30 MG PO TABS
150.0000 mg | ORAL_TABLET | ORAL | 0 refills | Status: DC | PRN
  Filled 2024-01-01: qty 60, 12d supply, fill #0

## 2024-01-01 MED ORDER — SENNA 8.6 MG PO TABS
2.0000 | ORAL_TABLET | Freq: Every day | ORAL | Status: DC
  Administered 2024-01-01 – 2024-01-02 (×2): 17.2 mg via ORAL
  Filled 2024-01-01 (×2): qty 2

## 2024-01-01 MED ORDER — CHLORHEXIDINE GLUCONATE CLOTH 2 % EX PADS
6.0000 | MEDICATED_PAD | Freq: Every day | CUTANEOUS | Status: DC
  Administered 2024-01-01 – 2024-01-02 (×2): 6 via TOPICAL

## 2024-01-01 MED ORDER — HEPARIN SODIUM (PORCINE) 1000 UNIT/ML IJ SOLN
INTRAMUSCULAR | Status: AC
  Filled 2024-01-01: qty 4

## 2024-01-01 MED ORDER — ATORVASTATIN CALCIUM 80 MG PO TABS
80.0000 mg | ORAL_TABLET | Freq: Every day | ORAL | 0 refills | Status: AC
  Filled 2024-01-01: qty 30, 30d supply, fill #0

## 2024-01-01 MED ORDER — BISACODYL 10 MG RE SUPP
10.0000 mg | Freq: Once | RECTAL | Status: DC
Start: 2024-01-01 — End: 2024-01-02
  Filled 2024-01-01: qty 1

## 2024-01-01 MED ORDER — CINACALCET HCL 30 MG PO TABS
150.0000 mg | ORAL_TABLET | ORAL | 0 refills | Status: DC
  Filled 2024-01-01: qty 60, fill #0

## 2024-01-01 MED ORDER — FLEET ENEMA RE ENEM: 1.0000 | ENEMA | Freq: Once | RECTAL | Status: DC

## 2024-01-01 MED ORDER — BISACODYL 10 MG RE SUPP
10.0000 mg | Freq: Once | RECTAL | Status: DC
  Filled 2024-01-01: qty 1

## 2024-01-01 MED ORDER — SENNA 8.6 MG PO TABS
2.0000 | ORAL_TABLET | Freq: Every day | ORAL | 0 refills | Status: AC
  Filled 2024-01-01: qty 120, 60d supply, fill #0

## 2024-01-01 MED ORDER — DOXERCALCIFEROL 4 MCG/2ML IV SOLN
INTRAVENOUS | Status: AC
Start: 2024-01-01 — End: 2024-01-01
  Filled 2024-01-01: qty 2

## 2024-01-01 MED ORDER — ONDANSETRON HCL 4 MG PO TABS
4.0000 mg | ORAL_TABLET | Freq: Three times a day (TID) | ORAL | 0 refills | Status: AC | PRN
  Filled 2024-01-01: qty 20, 7d supply, fill #0

## 2024-01-01 NOTE — Assessment & Plan Note (Signed)
 Bradycardia with first-degree AV block and intermediate pauses, last sinus pause 11/24. - Cardiology consulted, appreciate reccomendations  - ZIO monitor ordered - Continue atorvastatin  80 mg daily - Outpatient sleep study recommended - Avoid nodal agents

## 2024-01-01 NOTE — Progress Notes (Signed)
 Daily Progress Note Intern Pager: [phone]  Patient name: James Wall [facility] Medical record number: [id number] Date of birth: [birth year] Age: 57 y.o. Gender: male  Primary Care Provider: Janna Ferrier, DO Consultants: PCCM, IR, nephrology, urology  Code Status: Full Code  Pt Overview and Major Events to Date:  11/21: Admitted to ICU, IR embolization 11/22: Transferred to FMTS 11/23: HD 11/25: HD  James Wall is a 58 year old male with past medical history of ESRD on HD, cirrhosis, prostate cancer, anxiety, arthritis, CHF, COPD, DM, PSVT, and CVA who presented with abdominal pain secondary to large left perinephric hematoma with extensive retroperitoneal hemorrhage status post left renal embolization and ICU stay, transferred to FMTS on 11/22.   Assessment & Plan Stable Perinephric hematoma Abdominal pain S/p L renal artery embolization 11/21. H&H has remained stable throughout hospital course.  - Continue daily H&H  - Continue pain control with Norco 5-325 1 tab q6 PRN - Continue Simethicone  for bloating/flatulence - Continue Zofran  ODT as needed - AM Lab: CBC, RFP, Mag Sinus pause Chronic sinus bradycardia First degree AV block Bradycardia with first-degree AV block and intermediate pauses, last sinus pause 11/24. - Cardiology consulted, appreciate reccomendations  - ZIO monitor ordered - Continue atorvastatin  80 mg daily - Outpatient sleep study recommended - Avoid nodal agents HTN (hypertension) Per cardiology, added hydralazine  25 mg 3 times daily instead of restarting home clonidine  patch at this time.  - Continue home amlodipine  10 daily - Continue home isosorbide  20 twice daily - Continue hydralazine  25 mg TID - D/c metoprolol , no clonidine  patch at this time  - Follow up Echo with LVEF of 55-60% ESRD (end stage renal disease) on dialysis (HCC) Last HD 11/25, due to holiday schedule next HD planned for 11/28. - Appreciate ongoing nephrology  recommendations Constipation C/o No good BM over a week now. Had some small BM yesterday but still w/ LLQ abdominal pain due to stool burden per patient - Dulcolax 10 mg PR Once  - Fleet Enema once - Continue SENOKOT Daily - Continue MiRaLax  Daily Chronic health problem HFpEF: CTM volume status.  Medications as above. GERD: Protonix  40 mg daily  HLD: Crestor  10 mg daily  Secondary hyperparathyroidism: Sensipar  90 mg daily  COPD: home Stiolto Respimat , on formulary Brovana  neb BID and Incruse Ellipta    FEN/GI: Renal diet PPx: SQH Dispo:Home pending clinical improvement  likely today after iHD  Subjective:  C/o LLQ pain states it is from no good BM over a week. States otherwise doing well and ready to go home if he could have good BM today  Objective: Temp:  [97.9 F (36.6 C)-99 F (37.2 C)] 98.5 F (36.9 C) (11/28 0811) Pulse Rate:  [70-79] 70 (11/28 0811) Resp:  [13-18] 15 (11/28 0811) BP: (135-176)/(70-85) 143/85 (11/28 0811) SpO2:  [90 %-98 %] 92 % (11/28 0811) Weight:  [87.7 kg] 87.7 kg (11/28 0500) Physical Exam: General: Non toxic Cardiovascular: RRR S1S2 Respiratory: No distress on RA Abdomen: Soft Non tender  Extremities: Bilaterally upper and lower extremities 2+ strength   Laboratory: Most recent CBC Lab Results  Component Value Date   WBC 7.7 01/01/2024   HGB 9.1 (L) 01/01/2024   HCT 28.2 (L) 01/01/2024   MCV 97.9 01/01/2024   PLT 188 01/01/2024   Most recent BMP    Latest Ref Rng & Units 01/01/2024    5:35 AM  BMP  Glucose 70 - 99 mg/dL 89   BUN 6 - 20 mg/dL 43   Creatinine  0.61 - 1.24 mg/dL 89.17   Sodium 864 - 854 mmol/L 134   Potassium 3.5 - 5.1 mmol/L 4.9   Chloride 98 - 111 mmol/L 96   CO2 22 - 32 mmol/L 26   Calcium  8.9 - 10.3 mg/dL 9.2     Imaging/Diagnostic Tests: No new imaging  Suzen Houston NOVAK, DO 01/01/2024, 8:47 AM  PGY-1, [facility] FPTS Intern pager: [phone], text pages welcome Secure chat group [facility]  [facility] Teaching Service

## 2024-01-01 NOTE — Assessment & Plan Note (Signed)
 C/o No good BM over a week now. Had some small BM yesterday but still w/ LLQ abdominal pain due to stool burden per patient - Dulcolax 10 mg PR Once  - Fleet Enema once - Continue SENOKOT Daily - Continue MiRaLax  Daily

## 2024-01-01 NOTE — Progress Notes (Signed)
 [facility] Progress Note   Subjective:   Having ongoing constipation and stomach cramps. Denies SOB, CP, dizziness.   Objective Vitals:   01/01/24 0500 01/01/24 0514 01/01/24 0811 01/01/24 0852  BP:  (!) 176/78 (!) 143/85   Pulse:  73 70 74  Resp:  14 15 17   Temp:  99 F (37.2 C) 98.5 F (36.9 C)   TempSrc:  Oral Oral   SpO2:  90% 92% 98%  Weight: 87.7 kg     Height:       Physical Exam General: alert male in NAD Heart: RRR, no murmurs Lungs: CTA bilaterally, respirations unlabored Abdomen: Moderately distended, +BS Extremities: No edema b/l lower extremities Dialysis Access:  [facility]  Additional Objective Labs: Basic Metabolic Panel: Recent Labs  Lab 12/30/23 0419 12/31/23 0344 01/01/24 0535  NA 136 135 134*  K 4.1 4.4 4.9  CL 98 95* 96*  CO2 25 26 26   GLUCOSE 98 91 89  BUN 24* 33* 43*  CREATININE 6.81* 8.54* 10.82*  CALCIUM  8.8* 9.0 9.2  PHOS 2.9 3.7 3.5   Liver Function Tests: Recent Labs  Lab 12/30/23 0419 12/31/23 0344 01/01/24 0535  ALBUMIN  2.5* 2.5* 2.5*   No results for input(s): LIPASE, AMYLASE in the last 168 hours. CBC: Recent Labs  Lab 12/25/23 1353 12/26/23 0237 12/26/23 0652 12/28/23 0551 12/28/23 1320 12/30/23 0624 12/31/23 0344 01/01/24 0535  WBC 6.6 5.9  --  6.5  --   --  6.3 7.7  HGB 7.5* 6.1*   < > 8.2*   < > 9.1* 9.4* 9.1*  HCT 23.4* 19.4*   < > 24.6*   < > 27.8* 29.1* 28.2*  MCV 101.7* 102.1*  --  95.3  --   --  98.6 97.9  PLT 133* 136*  --  141*  --   --  165 188   < > = values in this interval not displayed.   Blood Culture    Component Value Date/Time   SDES BLOOD SITE NOT SPECIFIED 10/19/2022 1501   SPECREQUEST  10/19/2022 1501    BOTTLES DRAWN AEROBIC AND ANAEROBIC Blood Culture results may not be optimal due to an excessive volume of blood received in culture bottles   CULT  10/19/2022 1501    NO GROWTH 5 DAYS Performed at [facility], [address], [place], [place] [address]     REPTSTATUS 10/24/2022 FINAL 10/19/2022 1501    Cardiac Enzymes: No results for input(s): CKTOTAL, CKMB, CKMBINDEX, TROPONINI in the last 168 hours. CBG: No results for input(s): GLUCAP in the last 168 hours. Iron  Studies: No results for input(s): IRON , TIBC, TRANSFERRIN, FERRITIN in the last 72 hours. @lablastinr3 @ Studies/Results: No results found. Medications:   amLODipine   10 mg Oral QHS   arformoterol   15 mcg Nebulization BID   And   umeclidinium bromide   1 puff Inhalation Daily   atorvastatin   80 mg Oral Daily   bisacodyl   10 mg Rectal Once   Chlorhexidine  Gluconate Cloth  6 each Topical Q0600   cinacalcet   150 mg Oral Once per day on Sunday Tuesday Friday   darbepoetin (ARANESP ) injection - DIALYSIS  150 mcg Subcutaneous Q Sun-1800   doxercalciferol   4 mcg Intravenous Once per day on Sunday Tuesday Friday   ferric citrate   630 mg Oral TID WC   heparin  sodium (porcine)  3,200 Units Intracatheter Once   hydrALAZINE   25 mg Oral Q8H   isosorbide  dinitrate  20 mg Oral BID  loratadine   10 mg Oral Daily   pantoprazole   40 mg Oral Daily   polyethylene glycol  17 g Oral Daily   senna  2 tablet Oral Daily    Dialysis Orders: [facility] M[facility] 4h  B400  86.7kg  2K bath  TDC  Heparin  4500 + 2000 midrun Last OP HD 11/19, post wt 90.8kg (+4kg) Mircera 150mcg q2wks - last 10/27 Hectorol  4mcg qHD Sensipar  120mg  qHD Usually doesn't get to dry wt, come off 1-6 kg over  Signs off early at 2.5- 3.5 hrs usually    Assessment/Plan: L renal/ perinephric bleed: w/ assoc RP hemorrhage. CTA showed extravasation and IR did L renal artery embolization procedure on 12/25/23. Pain/constipation managed by PMD.  ESRD: on HD MWF. Due to Holiday schedule will run on Sunday, Tuesday and Friday this week.   HTN: elevated today but overall improved, continue home meds and UF with HD as tolerated Volume: CXR 11/21 showed pulm edema.O2 requirement has improved, now on RA.  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 ESA started Sunday Secondary hyperparathyroidism - Ca and phos in goal. Continue home meds. Nutrition - renal diet with fluid restrictions    Lucie Collet, PA-C 01/01/2024, 9:44 AM  [facility] Pager: [phone]

## 2024-01-01 NOTE — Plan of Care (Signed)
  Problem: Education: Goal: Knowledge of General Education information will improve Description: Including pain rating scale, medication(s)/side effects and non-pharmacologic comfort measures Outcome: Progressing   Problem: Health Behavior/Discharge Planning: Goal: Ability to manage health-related needs will improve Outcome: Progressing   Problem: Clinical Measurements: Goal: Ability to maintain clinical measurements within normal limits will improve Outcome: Progressing   Problem: Activity: Goal: Risk for activity intolerance will decrease Outcome: Progressing   Problem: Nutrition: Goal: Adequate nutrition will be maintained Outcome: Progressing   Problem: Coping: Goal: Level of anxiety will decrease Outcome: Progressing   Problem: Pain Managment: Goal: General experience of comfort will improve and/or be controlled Outcome: Progressing   Problem: Safety: Goal: Ability to remain free from injury will improve Outcome: Progressing

## 2024-01-01 NOTE — Assessment & Plan Note (Signed)
 S/p L renal artery embolization 11/21. H&H has remained stable throughout hospital course.  - Continue daily H&H  - Continue pain control with Norco 5-325 1 tab q6 PRN - Continue Simethicone  for bloating/flatulence - Continue Zofran  ODT as needed - AM Lab: CBC, RFP, Mag

## 2024-01-01 NOTE — Assessment & Plan Note (Signed)
 HFpEF: CTM volume status.  Medications as above. GERD: Protonix  40 mg daily  HLD: Crestor  10 mg daily  Secondary hyperparathyroidism: Sensipar  90 mg daily  COPD: home Stiolto Respimat , on formulary Brovana  neb BID and Incruse Ellipta 

## 2024-01-01 NOTE — Progress Notes (Addendum)
 Pt. Came in on bed, awake and oriented. Consent signed and on fie.  Pt started with no complaints   UF removal: 1700 Tx duration: 3.06 hours   Access used: Right CVC Access issue: None  Changed dressing: Dressing due on 01/08/2024  Hooked to 02 at 2LPM via nasal cannula for support.  Tx ended with 24 mins Tx time. He said that he is having abdominal pain and refused any pain meds. Stated that he just wanted to go to his room to be able to move bowels. Catheter locked and dwelled with heparin . Endorsed to floor nurse. Transported to room.   Alexandra Posadas Rubi Haruki Arnold, RN [facility]

## 2024-01-01 NOTE — Progress Notes (Signed)
   01/01/24 1836  Vitals  Temp 99.4 F (37.4 C)  Pulse Rate 73  Resp 18  BP (!) 168/84  SpO2 98 %  O2 Device Nasal Cannula  Weight 92.3 kg  Type of Weight Post-Dialysis  Oxygen Therapy  O2 Flow Rate (L/min) 2 L/min  Patient Activity (if Appropriate) In bed  Oximetry Probe Site Changed No  Post Treatment  Dialyzer Clearance Lightly streaked  Liters Processed 68.9  Fluid Removed (mL) 1700 mL  Tolerated HD Treatment Yes

## 2024-01-01 NOTE — Progress Notes (Signed)
 OT Cancellation Note  Patient Details Name: James Wall MRN: [id number] DOB: [birth year]   Cancelled Treatment:    Reason Eval/Treat Not Completed: (P) Patient declined, abdominal pain, hasn't eaten due to not having a BM in a while and feeling tired. Pt reports feeling as though nursing staff last night were rude to him, used active listening as Pt tearfully explained how he is in pain, feels as though he has been in hospital too long, wishes to return home, waiting on HD today so he can have an enema later, and Pt feels as though he has no control over what happens to him. Sat with Pt and encouraged him to best of my ability. Pt appreciative. Will continue to follow acutely and check back.   [name] 01/01/2024, 1:46 PM

## 2024-01-01 NOTE — Assessment & Plan Note (Signed)
 Last HD 11/25, due to holiday schedule next HD planned for 11/28. - Appreciate ongoing nephrology recommendations

## 2024-01-01 NOTE — Progress Notes (Signed)
 Patient refused Mirilax dose this morning. Patient stated, I have been taking that shit for two days now and nothing has happened.   Patient refused Dulcolax suppository and Senokot. Patient explains he will not take any medication to make him have a BM until he has completed his HD today.   Patient refused Claritin , explaining he does not take this at home.   Patient refused Auryxia , explaining he only takes that when he eats. Patient unwilling to eat at this time. He explains he does not want to eat until he has a BM. He explains if he eats, his stomach will hurt.

## 2024-01-01 NOTE — Assessment & Plan Note (Signed)
 Per cardiology, added hydralazine  25 mg 3 times daily instead of restarting home clonidine  patch at this time.  - Continue home amlodipine  10 daily - Continue home isosorbide  20 twice daily - Continue hydralazine  25 mg TID - D/c metoprolol , no clonidine  patch at this time  - Follow up Echo with LVEF of 55-60%

## 2024-01-02 ENCOUNTER — Other Ambulatory Visit: Payer: Self-pay | Admitting: Student

## 2024-01-02 ENCOUNTER — Other Ambulatory Visit (HOSPITAL_COMMUNITY): Payer: Self-pay

## 2024-01-02 DIAGNOSIS — I4719 Other supraventricular tachycardia: Secondary | ICD-10-CM

## 2024-01-02 DIAGNOSIS — R000 Tachycardia, unspecified: Secondary | ICD-10-CM | POA: Insufficient documentation

## 2024-01-02 LAB — RENAL FUNCTION PANEL
Albumin: 2.5 g/dL — ABNORMAL LOW (ref 3.5–5.0)
Anion gap: 12 (ref 5–15)
BUN: 27 mg/dL — ABNORMAL HIGH (ref 6–20)
CO2: 27 mmol/L (ref 22–32)
Calcium: 9.1 mg/dL (ref 8.9–10.3)
Chloride: 93 mmol/L — ABNORMAL LOW (ref 98–111)
Creatinine, Ser: 7.35 mg/dL — ABNORMAL HIGH (ref 0.61–1.24)
GFR, Estimated: 8 mL/min — ABNORMAL LOW (ref >60)
Glucose, Bld: 88 mg/dL (ref 70–99)
Phosphorus: 3.1 mg/dL (ref 2.5–4.6)
Potassium: 4.2 mmol/L (ref 3.5–5.1)
Sodium: 132 mmol/L — ABNORMAL LOW (ref 135–145)

## 2024-01-02 LAB — CBC
HCT: 27.3 % — ABNORMAL LOW (ref 39.0–52.0)
Hemoglobin: 8.9 g/dL — ABNORMAL LOW (ref 13.0–17.0)
MCH: 32.1 pg (ref 26.0–34.0)
MCHC: 32.6 g/dL (ref 30.0–36.0)
MCV: 98.6 fL (ref 80.0–100.0)
Platelets: 192 K/uL (ref 150–400)
RBC: 2.77 MIL/uL — ABNORMAL LOW (ref 4.22–5.81)
RDW: 18.8 % — ABNORMAL HIGH (ref 11.5–15.5)
WBC: 8.7 K/uL (ref 4.0–10.5)
nRBC: 0 % (ref 0.0–0.2)

## 2024-01-02 MED ORDER — SENNA 8.6 MG PO TABS: 2.0000 | ORAL_TABLET | Freq: Every day | ORAL | Status: DC

## 2024-01-02 MED ORDER — POLYETHYLENE GLYCOL 3350 17 G PO PACK: 17.0000 g | PACK | Freq: Every day | ORAL | Status: AC

## 2024-01-02 MED ORDER — CINACALCET HCL 30 MG PO TABS
120.0000 mg | ORAL_TABLET | ORAL | 0 refills | Status: AC | PRN
  Filled 2024-01-02 (×2): qty 60, 15d supply, fill #0

## 2024-01-02 MED ORDER — SIMETHICONE 40 MG/0.6ML PO SUSP: 40.0000 mg | Freq: Four times a day (QID) | ORAL | Status: AC | PRN

## 2024-01-02 NOTE — Assessment & Plan Note (Addendum)
 S/p L renal artery embolization with IR 11/21. Has required multiple blood transfusions, last on 11/23. Hgb has remained stable since. Repeat CTAP on 11/24 without new/worsening bleed.  - Pain regimen: Norco 5-325 q6 PRN - Zofran  ODT PRN - AM CBC

## 2024-01-02 NOTE — Discharge Summary (Addendum)
 Family Medicine Teaching [facility] Discharge Summary  Patient name: James Wall Medical record number: [id number] Date of birth: 1965/12/17 Age: 58 y.o. Gender: male Date of Admission: 12/25/2023  Date of Discharge: 01/02/24  Admitting Physician: Harlene Na, DO  Primary Care Provider: Janna Ferrier, DO Consultants: PCCM, IR, Nephrology, Urology, Cardiology   Indication for Hospitalization: Perinephric hematoma  Discharge Diagnoses/Problem List:  Principal Problem for Admission: Perinephric hematoma Other Problems addressed during stay:  Principal Problem:   Stable Perinephric hematoma Active Problems:   ESRD (end stage renal disease) on dialysis [facility])   Constipation   Chronic sinus bradycardia   HTN (hypertension)   Sinus pause   Left renal mass   Abdominal pain   First degree AV block   Tachycardia   Brief Hospital Course:  James Wall is a 58 y.o. male w/PMHx of ESRD on HD, chronic HFrEF, HTN, COPD, prostate cancer, liver cirrhosis, who was admitted for a large left perinephric hematoma w/ active extravasation now s/p embolization of vessel by IR.   Perinephric Hematoma Acute anemia  Presented with abdominal pain to the ED. Hgb on admission 9.6 and down-trending to 6.1 requiring 1 unit transfusion with improvement to 7.1. CT AP significant for large left perinephric hematoma with extravasation and extensive retroperitoneal hemorrhage due to spontaneous rupture of left renal neoplasm. He underwent an IR embolization of the left renal artery on 11/21 and was admitted to the ICU for monitoring s/p procedure. After procedure and stabilization, patient was transferred to FMTS on 11/22. Hospital course was complicated by persistent abdominal pain and anemia requiring additional blood products. Throughout hospitalization, patient received a total of 3 units PRBC with stabilization of Hgb prior to discharge. Urology recommended outpatient follow up, no  acute inpatient intervention. Abdominal pain had become severe with severe tenderness to palpation on abdominal exam on 11/24, so stat repeat CTAP with contrast was ordered and showed stable and slightly decreased in size L retroperitoneal hematoma. Pain regimen was tapered down to Norco 5-325mg  q6h PRN and patient had pain relief by time of discharge with pain medication. Patient ultimately discharged home with home health PT/OT.   Sinus pause Chronic sinus bradycardia The patient experienced a 5.33 second asymptomatic pause the morning of 11/24. Metoprolol  was held. Bradycardia remained consistent with HR in the 40-50s. Cardiology was consulted with diagnosis of high degree AV block secondary to OSA and recommendations to discontinue metoprolol  50mg  BID, place a Zio monitor to look for any signals of either further pauses of PSVT off of metoprolol  outpatient, and obtain outpatient sleep study. Other cardiology recommendations include increasing Crestor  to 20mg  daily for multivessel CAD and adding hydralazine  25mg  TID. The patient had another 5.4 second pause the morning of 11/26 which was also asymptomatic. Per Cardiology, patient not a good pacemaker candidate given possibility of infection risks due to his HD and recommended to continue supportive care.   Tachycardia Following medication changes as mentioned above for bradycardia, patient with intermittent asymptomatic tachycardia to 120s at rest and with exertion. 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 Per cardiology, plan for Zio patch as above and continue to hold metoprolol  at this time. Plan for medications as above, Zio patch, and cardiology follow up outpatient.   Constipation  Patient experienced constipation during admission, likely secondary to opiate medications for abdominal pain as above. Patient required oral, suppository, and enema bowel regimen. Constipation resolved prior to discharge.  ESRD Nephrology was consulted and  dialysis was continued while patient was  admitted.    Other chronic conditions were medically managed with home medications and formulary alternatives as necessary (HFpEF, GERD, HLD, Secondary hyperparathyroidism, COPD)  PCP follow up recommendations:  Ensure cardiology follow up and Zio patch palcement.  Follow up with urology.   Follow up with nephrology.  Encourage outpatient sleep study  Monitor for bleeding signs    Results/Tests Pending at Time of Discharge:  Unresulted Labs (From admission, onward)    None      Disposition: Home with Home Health   Discharge Condition: Improved, stable   Discharge Exam:  Vitals:   01/02/24 1309 01/02/24 1331  BP: 116/85 128/78  Pulse: 73 (!) 116  Resp: 11 16  Temp: 98.5 F (36.9 C)   SpO2: 98% 95%   General: Resting comfortably in room. CV: Intermittently tachycardic S1/S2. No extra heart sounds. Warm and well-perfused. Pulm: Breathing comfortably on room air. No increased WOB. Abd: Normal BS. Soft, mild distention, mild tenderness of L sideded abdomen.   Skin:  Warm, dry. Psych: Pleasant and appropriate.   Significant Procedures:  12/25/23: L renal artery embolization with IR  Significant Labs and Imaging:  Recent Labs  Lab 01/01/24 0535 01/01/24 1243 01/02/24 0437  WBC 7.7 7.7 8.7  HGB 9.1* 8.8* 8.9*  HCT 28.2* 27.5* 27.3*  PLT 188 196 192   Recent Labs  Lab 01/01/24 0535 01/01/24 1243 01/02/24 0437  NA 134* 135 132*  K 4.9 5.0 4.2  CL 96* 95* 93*  CO2 26 26 27   GLUCOSE 89 83 88  BUN 43* 45* 27*  CREATININE 10.82* 11.16* 7.35*  CALCIUM  9.2 9.2 9.1  MG 2.1  --   --   PHOS 3.5 3.6 3.1  ALBUMIN  2.5* 2.5* 2.5*   Hep B immune  Imaging: CT ABDOMEN PELVIS W CONTRAST Result Date: 12/28/2023 IMPRESSION: 1. Interval left renal artery embolization. Redemonstrated large left perinephric hematoma but stable to slightly decreased in size. Residual left retroperitoneal hematoma more dense in the interim  consistent with organizing hematoma, but overall felt slightly decreased in size compared to prior. Previously noted left midpole extravasation no longer visualized. Small volume gas midpole left kidney probably post emobolization changes. Suspect that there is diffuse hypoenahcement of the thin rind of residual left renal cortex as may be seen with infarct but limited due to atrophy, multiple cysts and presence of large hemtoma. 2. Slight increased slightly dense abdominopelvic ascites but no extravasation identified in the abdomen. 3. Mild wall thickening of the descending colon compared to prior,? Colitis. 4. Small bilateral pleural effusions, slightly increased on the left side. Increased partial consolidation in the left lower lobe, atelectasis versus pneumonia. 5. Increased anasarca and left greater than right flank edema.   DG Chest 1 View Result Date: 12/25/2023 EXAM: 1 VIEW(S) XRAY OF THE CHEST 12/25/2023 08:28:00 AM IMPRESSION: 1. New small left pleural effusion with worsening left basilar aeration, which may reflect atelectasis or airspace disease; aspiration remains a consideration. 2. Mild pulmonary edema. 3. Artifact overlying the right lower lobe , new from previous exam.  IR EMBO ART  VEN HEMORR LYMPH EXTRAV  INC GUIDE ROADMAPPING Result Date: 12/25/2023  IMPRESSION: 1. Left renal arteriography demonstrates active contrast extravasation from a focal peripheral mid to lower pole renal artery branch. This is not associated with clear tumor vascularity and most likely etiology of active bleeding is felt to be rupture of a hemorrhagic cyst with associated arterial bleeding. In order to exclude an underlying tumor, recommend follow-up MRI with and  without contrast at a later time once the perinephric hematoma has resolved. 2. Successful embolization of active bleeding from the left renal artery with embolization performed at the level of a superior segmental branch of the left renal artery  extending into the distal main left renal artery. No further contrast extravasation was identified after coil embolization.   CT ABDOMEN PELVIS W CONTRAST Result Date: 12/25/2023 EXAM: CT ABDOMEN AND PELVIS WITH CONTRAST 12/25/2023 IMPRESSION: 1. 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 Associated fluid tracks around the pancreas and duodenum. 2. Mild bland ascites. No hemoperitoneum. 3. Moderate diffuse subcutaneous edema consistent with anasarca. 4. Markedly atrophic kidneys with innumerable simple cysts, compatible with end-stage renal disease; unchanged from prior. 5. Extensive multivessel coronary artery calcifications and mild cardiomegaly. 6. Small hiatal hernia.    Echo 12/29/23: LVEF 55-60. RV systolic pressure 25.3.    Discharge Medications:  Allergies as of 01/02/2024       Reactions   Venofer   [ferric Oxide] Itching   Aspirin  Other (See Comments)   Avoids due to renal disease;  Made congestion worse        Medication List     STOP taking these medications    cloNIDine  0.3 mg/24hr patch Commonly known as: CATAPRES  - Dosed in mg/24 hr   metoprolol  tartrate 50 MG tablet Commonly known as: LOPRESSOR    rosuvastatin  10 MG tablet Commonly known as: CRESTOR        TAKE these medications    albuterol  108 (90 Base) MCG/ACT inhaler Commonly known as: VENTOLIN  HFA Inhale 2 puffs into the lungs every 6 (six) hours as needed for wheezing or shortness of breath.   amLODipine  10 MG tablet Commonly known as: NORVASC  Take 1 tablet (10 mg total) by mouth at bedtime.   atorvastatin  80 MG tablet Commonly known as: LIPITOR Take 1 tablet (80 mg total) by mouth daily.   Auryxia  1 GM 210 MG(Fe) tablet Generic drug: ferric citrate  Take 630 mg by mouth 3 (three) times daily with meals. And 210mg  (1 tablet) up to three times a day with  snacks.   cinacalcet  30 MG tablet Commonly known as: SENSIPAR  Take 4 tablets (120 mg total) by mouth every dialysis (give after dialysis with meal). What changed:  medication strength when to take this reasons to take this additional instructions Another medication with the same name was removed. Continue taking this medication, and follow the directions you see here.   HECTOROL  IV Inject 3 mcg into the vein every Monday, Wednesday, and Friday with hemodialysis. Provided and administered by dialysis clinic on Monday, Wednesday, Friday.   hydrALAZINE  25 MG tablet Commonly known as: APRESOLINE  Take 1 tablet (25 mg total) by mouth 3 (three) times daily. What changed:  medication strength how much to take when to take this   HYDROcodone -acetaminophen  5-325 MG tablet Commonly known as: NORCO/VICODIN Take 1 tablet by mouth every 6 (six) hours as needed for moderate pain (pain score 4-6).   hydrOXYzine  25 MG tablet Commonly known as: ATARAX  Take 25 mg by mouth daily.   isosorbide  dinitrate 20 MG tablet Commonly known as: ISORDIL  Take 1 tablet (20 mg total) by mouth 2 (two) times daily.   latanoprost  0.005 % ophthalmic solution Commonly known as: XALATAN  Place 1 drop into both eyes at bedtime.   MIRCERA IJ Inject 150 mcg into the vein every 14 (fourteen) days. Provided and administered by dialysis clinic every 14  days.   ondansetron  4 MG tablet Commonly known as: ZOFRAN  Take 1 tablet (4 mg total) by mouth every 8 (eight) hours as needed for nausea or vomiting.   pantoprazole  20 MG tablet Commonly known as: PROTONIX  TAKE 1 TABLET(20 MG) BY MOUTH DAILY What changed: See the new instructions.   polyethylene glycol powder 17 GM/SCOOP powder Commonly known as: GLYCOLAX /MIRALAX  Take 17 g by mouth daily. Dissolve 1 capful (17g) in 4-8 ounces of liquid and take by mouth daily.   polyethylene glycol 17 g packet Commonly known as: MIRALAX  / GLYCOLAX  Take 17 g by mouth  daily. Start taking on: January 03, 2024   senna 8.6 MG Tabs tablet Commonly known as: SENOKOT Take 2 tablets (17.2 mg total) by mouth daily.   senna 8.6 MG Tabs tablet Commonly known as: SENOKOT Take 2 tablets (17.2 mg total) by mouth daily. Start taking on: January 03, 2024   sevelamer  carbonate 800 MG tablet Commonly known as: RENVELA  Take 3,200 mg by mouth 3 (three) times daily with meals.   simethicone  40 MG/0.6ML drops Commonly known as: MYLICON Take 0.6 mLs (40 mg total) by mouth 4 (four) times daily as needed for flatulence.   Stiolto Respimat  2.5-2.5 MCG/ACT Aers Generic drug: Tiotropium Bromide-Olodaterol Inhale 2 puffs into the lungs daily.        Discharge Instructions: Please refer to Patient Instructions section of EMR for full details.  Patient was counseled important signs and symptoms that should prompt return to medical care, changes in medications, dietary instructions, activity restrictions, and follow up appointments.   Follow-Up Appointments:  Contact information for follow-up providers     [facility] Follow up.   Contact information: [address] [place]  [address] [phone]             Contact information for after-discharge care     Home Medical Care     [place] [facility]) .   Service: Home Health Services Contact information: [address] [place] [address] [place]  [address] [phone]                    Future Appointments  Date Time Provider Department Center  01/05/2024  2:10 PM Janna Ferrier, [facility]  01/07/2024  9:00 AM Santo Stanly LABOR, MD [facility]     [name], MD 01/02/2024, 5:09 PM PGY-2, [facility]

## 2024-01-02 NOTE — Assessment & Plan Note (Addendum)
 Patient with chronic sinus bradycardia, noted to have 1st degree AV block with intermittent pauses during admission. Home metoprolol  discontinued during admission.  - Appreciate Cardiology recommendations as below - Zio patch ordered - Outpt sleep study recommended - Avoid anti-nodal agents  - AM RFP, Mg

## 2024-01-02 NOTE — Assessment & Plan Note (Signed)
 Intermittent tachycardia at rest this morning to 120s. EKG with new widening of QRS complex, no apparent afib.  - Continue medications as above - Will reach out to Cardiology for any further recommendations

## 2024-01-02 NOTE — Plan of Care (Signed)
   Problem: Nutrition: Goal: Adequate nutrition will be maintained Outcome: Progressing

## 2024-01-02 NOTE — Assessment & Plan Note (Addendum)
 HFpEF: CTM volume status.  Medications as above. GERD: Protonix  40 mg daily  HLD: Crestor  10 mg daily  Secondary hyperparathyroidism: Sensipar  90 mg daily  COPD: home Stiolto Respimat , on formulary Brovana  neb BID and Incruse Ellipta  HTN: Hydral 25 TID per cardiology, d/c home clonidine  patch and metoprolol  at this time. Continue home amlodipine  10 daily, isosorbide  20 BID.

## 2024-01-02 NOTE — Progress Notes (Signed)
   Notified by primary team that patient was having run of tachycardia with rates in the 120. Reviewed EKG and telemetry strips with Dr. Kennyth who felt it looked like AVRNT. Recommended holding off on AV nodal agents given sinus pauses and high grade AV block earlier in admission and will let patient recover from current illness. Will order 2 week live Zio monitor - patient is being discharged today so it will have to be mailed to him. He already has follow-up arranged.   James Wall E Rozella Servello, PA-C 01/02/2024 2:41 PM

## 2024-01-02 NOTE — Progress Notes (Signed)
 RNCM received HHPT and OT orders for patient.  RNCM offered choice for [facility] services and patient with no preference, so Joane at [facility] contacted with orders and confirmation received.  Patient in agreement to services.

## 2024-01-02 NOTE — Progress Notes (Signed)
 DISCHARGE NOTE HOME James Wall to be discharged Home per MD order. Discussed prescriptions and follow up appointments with the patient. Prescriptions given to patient; medication list explained in detail. Patient verbalized understanding.  Skin clean, dry and intact without evidence of skin break down, no evidence of skin tears noted. IV catheter discontinued intact. Site without signs and symptoms of complications. Dressing and pressure applied. Pt denies pain at the site currently. No complaints noted.  Discharging with HD cath Patient free of other lines, drains, and wounds.   An After Visit Summary (AVS) was printed and given to the patient. Patient escorted via wheelchair, and discharged home via private auto.  Peyton SHAUNNA Pepper, RN

## 2024-01-02 NOTE — Assessment & Plan Note (Deleted)
 Per cardiology, added hydralazine  25 mg 3 times daily instead of restarting home clonidine  patch at this time.  - Continue home amlodipine  10 daily - Continue home isosorbide  20 twice daily - Continue hydralazine  25 mg TID - D/c metoprolol , no clonidine  patch at this time  - Follow up Echo with LVEF of 55-60%

## 2024-01-02 NOTE — Progress Notes (Signed)
 Daily Progress Note Intern Pager: [phone]  Patient name: James Wall [facility] Medical record number: [id number] Date of birth: [birth year] Age: 57 y.o. Gender: male  Primary Care Provider: Janna Ferrier, DO Consultants: PCCM, IR, Nephrology, Urology Code Status: Full   Pt Overview and Major Events to Date:  11/21: Admitted to ICU, IR embolization 11/22: Transferred to FMTS   Medical Decision Making:  James Wall 58 y.o. with hx ESRD on HD, cirrhosis, prostate cancer, CHF, COPD, DM, CVA admitted for ruptured left renal neoplasm with large left perinephric hematoma s/p L renal artery embolization. Required multiple blood transfusions with eventual stable Hgb.   Assessment & Plan Stable Perinephric hematoma Abdominal pain S/p L renal artery embolization with IR 11/21. Has required multiple blood transfusions, last on 11/23. Hgb has remained stable since. Repeat CTAP on 11/24 without new/worsening bleed.  - Pain regimen: Norco 5-325 q6 PRN - Zofran  ODT PRN - AM CBC Sinus pause Chronic sinus bradycardia First degree AV block Patient with chronic sinus bradycardia, noted to have 1st degree AV block with intermittent pauses during admission. Home metoprolol  discontinued during admission.  - Appreciate Cardiology recommendations as below - Zio patch ordered - Outpt sleep study recommended - Avoid anti-nodal agents  - AM RFP, Mg Tachycardia Intermittent tachycardia at rest this morning to 120s. EKG with new widening of QRS complex, no apparent afib.  - Continue medications as above - Will reach out to Cardiology for any further recommendations  ESRD (end stage renal disease) on dialysis (HCC) - Continue HD per Nephrology - AM RFP, Mg Constipation Reports 2 BM overnight with symptomatic relief. Constipation resolved at this time. Miralax  and senna ordered as daily if patient elects to receive.  Chronic health problem HFpEF: CTM volume status.  Medications as  above. GERD: Protonix  40 mg daily  HLD: Crestor  10 mg daily  Secondary hyperparathyroidism: Sensipar  90 mg daily  COPD: home Stiolto Respimat , on formulary Brovana  neb BID and Incruse Ellipta  HTN: Hydral 25 TID per cardiology, d/c home clonidine  patch and metoprolol  at this time. Continue home amlodipine  10 daily, isosorbide  20 BID.   FEN/GI: Renal diet  PPx: SCDs Dispo: Home pending clinical improvement   Subjective:  Doing well this morning.   Objective: Temp:  [98.5 F (36.9 C)-99.4 F (37.4 C)] 98.8 F (37.1 C) (11/29 0712) Pulse Rate:  [69-191] 69 (11/29 0712) Resp:  [13-26] 18 (11/29 0712) BP: (110-168)/(75-96) 134/84 (11/29 0712) SpO2:  [92 %-100 %] 96 % (11/28 2353) FiO2 (%):  [21 %] 21 % (11/28 2031) Weight:  [86.3 kg-92.3 kg] 86.3 kg (11/29 0500) Physical Exam: General: Resting comfortably in room. CV: Intermittently tachycardic S1/S2. No extra heart sounds. Warm and well-perfused. Pulm: Breathing comfortably on room air. No increased WOB. Abd: Normal BS. Soft, mild distention, mild tenderness of L sideded abdomen.   Skin:  Warm, dry. Psych: Pleasant and appropriate.    Laboratory: Most recent CBC Lab Results  Component Value Date   WBC 8.7 01/02/2024   HGB 8.9 (L) 01/02/2024   HCT 27.3 (L) 01/02/2024   MCV 98.6 01/02/2024   PLT 192 01/02/2024   Most recent BMP    Latest Ref Rng & Units 01/02/2024    4:37 AM  BMP  Glucose 70 - 99 mg/dL 88   BUN 6 - 20 mg/dL 27   Creatinine 9.38 - 1.24 mg/dL 2.64   Sodium 864 - 854 mmol/L 132   Potassium 3.5 - 5.1 mmol/L 4.2   Chloride 98 -  111 mmol/L 93   CO2 22 - 32 mmol/L 27   Calcium  8.9 - 10.3 mg/dL 9.1     [name], MD 01/02/2024, 7:51 AM  PGY-2, [facility] FPTS Intern pager: [phone], text pages welcome Secure chat group [facility] Teaching Service

## 2024-01-02 NOTE — Assessment & Plan Note (Addendum)
 Reports 2 BM overnight with symptomatic relief. Constipation resolved at this time. Miralax  and senna ordered as daily if patient elects to receive.

## 2024-01-02 NOTE — Progress Notes (Signed)
 Patient ambulated an estimated 180ft with walker. SpO2 remained 95% or greater while ambulating on room air. Patient HR ranged from 80-110 bpm while ambulating.

## 2024-01-02 NOTE — Progress Notes (Signed)
 Patient states he had a medium sized BM last night. He explains that his abdomen feels less tight, experiencing less pain, and is able to tolerate breakfast.

## 2024-01-02 NOTE — Progress Notes (Signed)
 While patient is at rest, HR ranges from 60-80 bpm. Upon exertion, patient's HR ranges from 120-130 bpm. MD is aware. EKG completed. Patient is asymptomatic, denies chest pain, and denies SOB. Patient not in distress.

## 2024-01-02 NOTE — Progress Notes (Signed)
 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 Shaune recommendation.   Athenia Rys E Kingston Guiles, PA-C 01/02/2024 2:38 PM

## 2024-01-02 NOTE — Assessment & Plan Note (Addendum)
-   Continue HD per Nephrology - AM RFP, Mg

## 2024-01-02 NOTE — Progress Notes (Signed)
 [facility] Progress Note   Subjective:   Reports he had a BM yesterday and feels much better. Denies SOB, CP, dizziness ,nausea, palpitations.   Objective Vitals:   01/01/24 2353 01/02/24 0500 01/02/24 0712 01/02/24 0833  BP: 121/76  134/84   Pulse: 91  69   Resp: (!) 22  18   Temp: 98.6 F (37 C)  98.8 F (37.1 C)   TempSrc: Oral  Oral   SpO2: 96%   97%  Weight:  86.3 kg    Height:       Physical Exam General: alert male in NAD Heart: RRR, no murmurs Lungs: CTA bilaterally, respirations unlabored Abdomen: Moderately distended, +BS Extremities: No edema b/l lower extremities Dialysis Access:  [facility]  Additional Objective Labs: Basic Metabolic Panel: Recent Labs  Lab 01/01/24 0535 01/01/24 1243 01/02/24 0437  NA 134* 135 132*  K 4.9 5.0 4.2  CL 96* 95* 93*  CO2 26 26 27   GLUCOSE 89 83 88  BUN 43* 45* 27*  CREATININE 10.82* 11.16* 7.35*  CALCIUM  9.2 9.2 9.1  PHOS 3.5 3.6 3.1   Liver Function Tests: Recent Labs  Lab 01/01/24 0535 01/01/24 1243 01/02/24 0437  ALBUMIN  2.5* 2.5* 2.5*   No results for input(s): LIPASE, AMYLASE in the last 168 hours. CBC: Recent Labs  Lab 12/28/23 0551 12/28/23 1320 12/31/23 0344 01/01/24 0535 01/01/24 1243 01/02/24 0437  WBC 6.5  --  6.3 7.7 7.7 8.7  HGB 8.2*   < > 9.4* 9.1* 8.8* 8.9*  HCT 24.6*   < > 29.1* 28.2* 27.5* 27.3*  MCV 95.3  --  98.6 97.9 98.6 98.6  PLT 141*  --  165 188 196 192   < > = values in this interval not displayed.   Blood Culture    Component Value Date/Time   SDES BLOOD SITE NOT SPECIFIED 10/19/2022 1501   SPECREQUEST  10/19/2022 1501    BOTTLES DRAWN AEROBIC AND ANAEROBIC Blood Culture results may not be optimal due to an excessive volume of blood received in culture bottles   CULT  10/19/2022 1501    NO GROWTH 5 DAYS Performed at [facility], [address], [place], [place] [address]    REPTSTATUS 10/24/2022 FINAL 10/19/2022 1501    Cardiac Enzymes: No  results for input(s): CKTOTAL, CKMB, CKMBINDEX, TROPONINI in the last 168 hours. CBG: No results for input(s): GLUCAP in the last 168 hours. Iron  Studies: No results for input(s): IRON , TIBC, TRANSFERRIN, FERRITIN in the last 72 hours. @lablastinr3 @ Studies/Results: No results found. Medications:   amLODipine   10 mg Oral QHS   arformoterol   15 mcg Nebulization BID   And   umeclidinium bromide   1 puff Inhalation Daily   atorvastatin   80 mg Oral Daily   bisacodyl   10 mg Rectal Once   Chlorhexidine  Gluconate Cloth  6 each Topical Q0600   Chlorhexidine  Gluconate Cloth  6 each Topical Q0600   cinacalcet   150 mg Oral Once per day on Sunday Tuesday Friday   darbepoetin (ARANESP ) injection - DIALYSIS  150 mcg Subcutaneous Q Sun-1800   doxercalciferol   4 mcg Intravenous Once per day on Sunday Tuesday Friday   ferric citrate   630 mg Oral TID WC   hydrALAZINE   25 mg Oral Q8H   isosorbide  dinitrate  20 mg Oral BID   loratadine   10 mg Oral Daily   pantoprazole   40 mg Oral Daily   polyethylene glycol  17 g Oral Daily   senna  2 tablet Oral Daily    Dialysis Orders: [facility] M[facility] 4h  B400  86.7kg  2K bath  TDC  Heparin  4500 + 2000 midrun Last OP HD 11/19, post wt 90.8kg (+4kg) Mircera 150mcg q2wks - last 10/27 Hectorol  4mcg qHD Sensipar  120mg  qHD Usually doesn't get to dry wt, come off 1-6 kg over  Signs off early at 2.5- 3.5 hrs usually  Assessment/Plan: L renal/ perinephric bleed: w/ assoc RP hemorrhage. CTA showed extravasation and IR did L renal artery embolization procedure on 12/25/23. Pain/constipation managed by PMD.  ESRD: on HD MWF. Next HD Monday HTN: BP controlled, continue home meds and UF with HD as tolerated Volume: CXR 11/21 showed pulm edema.O2 requirement has improved, now on RA.  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 ESA started Sunday Secondary hyperparathyroidism - Ca and phos in goal. Continue home meds. Nutrition - renal diet with fluid restrictions  Lucie Collet, PA-C 01/02/2024, 9:58 AM  [facility] Pager: [phone]

## 2024-01-03 ENCOUNTER — Encounter (HOSPITAL_COMMUNITY): Payer: Self-pay

## 2024-01-03 ENCOUNTER — Observation Stay (HOSPITAL_COMMUNITY): Admission: EM | Admit: 2024-01-03 | Discharge: 2024-01-06 | DRG: 391 | Disposition: A | Attending: Emergency Medicine

## 2024-01-03 ENCOUNTER — Emergency Department (HOSPITAL_COMMUNITY)

## 2024-01-03 ENCOUNTER — Other Ambulatory Visit: Payer: Self-pay

## 2024-01-03 DIAGNOSIS — G8918 Other acute postprocedural pain: Principal | ICD-10-CM

## 2024-01-03 DIAGNOSIS — E875 Hyperkalemia: Secondary | ICD-10-CM

## 2024-01-03 DIAGNOSIS — K5900 Constipation, unspecified: Secondary | ICD-10-CM | POA: Diagnosis present

## 2024-01-03 DIAGNOSIS — Z789 Other specified health status: Secondary | ICD-10-CM

## 2024-01-03 DIAGNOSIS — R109 Unspecified abdominal pain: Secondary | ICD-10-CM | POA: Diagnosis present

## 2024-01-03 DIAGNOSIS — N186 End stage renal disease: Secondary | ICD-10-CM | POA: Diagnosis present

## 2024-01-03 DIAGNOSIS — R9431 Abnormal electrocardiogram [ECG] [EKG]: Secondary | ICD-10-CM | POA: Insufficient documentation

## 2024-01-03 LAB — COMPREHENSIVE METABOLIC PANEL WITH GFR
ALT: 28 U/L (ref 0–44)
AST: 58 U/L — ABNORMAL HIGH (ref 15–41)
Albumin: 2.8 g/dL — ABNORMAL LOW (ref 3.5–5.0)
Alkaline Phosphatase: 107 U/L (ref 38–126)
Anion gap: 16 — ABNORMAL HIGH (ref 5–15)
BUN: 46 mg/dL — ABNORMAL HIGH (ref 6–20)
CO2: 25 mmol/L (ref 22–32)
Calcium: 10.2 mg/dL (ref 8.9–10.3)
Chloride: 94 mmol/L — ABNORMAL LOW (ref 98–111)
Creatinine, Ser: 10.09 mg/dL — ABNORMAL HIGH (ref 0.61–1.24)
GFR, Estimated: 5 mL/min — ABNORMAL LOW (ref >60)
Glucose, Bld: 95 mg/dL (ref 70–99)
Potassium: 5.7 mmol/L — ABNORMAL HIGH (ref 3.5–5.1)
Sodium: 135 mmol/L (ref 135–145)
Total Bilirubin: 2.6 mg/dL — ABNORMAL HIGH (ref 0.0–1.2)
Total Protein: 7.3 g/dL (ref 6.5–8.1)

## 2024-01-03 LAB — CBC WITH DIFFERENTIAL/PLATELET
Abs Immature Granulocytes: 0.11 K/uL — ABNORMAL HIGH (ref 0.00–0.07)
Basophils Absolute: 0 K/uL (ref 0.0–0.1)
Basophils Relative: 1 %
Eosinophils Absolute: 0.1 K/uL (ref 0.0–0.5)
Eosinophils Relative: 1 %
HCT: 32.5 % — ABNORMAL LOW (ref 39.0–52.0)
Hemoglobin: 10.1 g/dL — ABNORMAL LOW (ref 13.0–17.0)
Immature Granulocytes: 1 %
Lymphocytes Relative: 6 %
Lymphs Abs: 0.5 K/uL — ABNORMAL LOW (ref 0.7–4.0)
MCH: 31.1 pg (ref 26.0–34.0)
MCHC: 31.1 g/dL (ref 30.0–36.0)
MCV: 100 fL (ref 80.0–100.0)
Monocytes Absolute: 0.8 K/uL (ref 0.1–1.0)
Monocytes Relative: 9 %
Neutro Abs: 7.3 K/uL (ref 1.7–7.7)
Neutrophils Relative %: 82 %
Platelets: 247 K/uL (ref 150–400)
RBC: 3.25 MIL/uL — ABNORMAL LOW (ref 4.22–5.81)
RDW: 18.4 % — ABNORMAL HIGH (ref 11.5–15.5)
WBC: 8.9 K/uL (ref 4.0–10.5)
nRBC: 0 % (ref 0.0–0.2)

## 2024-01-03 LAB — I-STAT CHEM 8, ED
BUN: 60 mg/dL — ABNORMAL HIGH (ref 6–20)
Calcium, Ion: 1.2 mmol/L (ref 1.15–1.40)
Chloride: 96 mmol/L — ABNORMAL LOW (ref 98–111)
Creatinine, Ser: 10.7 mg/dL — ABNORMAL HIGH (ref 0.61–1.24)
Glucose, Bld: 89 mg/dL (ref 70–99)
HCT: 34 % — ABNORMAL LOW (ref 39.0–52.0)
Hemoglobin: 11.6 g/dL — ABNORMAL LOW (ref 13.0–17.0)
Potassium: 6 mmol/L — ABNORMAL HIGH (ref 3.5–5.1)
Sodium: 134 mmol/L — ABNORMAL LOW (ref 135–145)
TCO2: 29 mmol/L (ref 22–32)

## 2024-01-03 LAB — TYPE AND SCREEN
ABO/RH(D): O POS
Antibody Screen: NEGATIVE

## 2024-01-03 LAB — LIPASE, BLOOD: Lipase: 34 U/L (ref 11–51)

## 2024-01-03 MED ORDER — ISOSORBIDE DINITRATE 20 MG PO TABS
20.0000 mg | ORAL_TABLET | Freq: Two times a day (BID) | ORAL | Status: DC
  Administered 2024-01-04 – 2024-01-06 (×6): 20 mg via ORAL
  Filled 2024-01-03 (×7): qty 1

## 2024-01-03 MED ORDER — ATORVASTATIN CALCIUM 80 MG PO TABS
80.0000 mg | ORAL_TABLET | Freq: Every day | ORAL | Status: DC
  Administered 2024-01-04 – 2024-01-06 (×3): 80 mg via ORAL
  Filled 2024-01-03 (×3): qty 1

## 2024-01-03 MED ORDER — HYDRALAZINE HCL 25 MG PO TABS
25.0000 mg | ORAL_TABLET | Freq: Three times a day (TID) | ORAL | Status: DC
  Administered 2024-01-04 – 2024-01-05 (×5): 25 mg via ORAL
  Filled 2024-01-03 (×6): qty 1

## 2024-01-03 MED ORDER — AMLODIPINE BESYLATE 10 MG PO TABS
10.0000 mg | ORAL_TABLET | Freq: Every day | ORAL | Status: DC
  Administered 2024-01-04 – 2024-01-05 (×3): 10 mg via ORAL
  Filled 2024-01-03 (×3): qty 1

## 2024-01-03 MED ORDER — ONDANSETRON 4 MG PO TBDP
4.0000 mg | ORAL_TABLET | Freq: Three times a day (TID) | ORAL | Status: DC | PRN
  Administered 2024-01-04: 4 mg via ORAL
  Filled 2024-01-03 (×2): qty 1

## 2024-01-03 MED ORDER — BISACODYL 10 MG RE SUPP: 10.0000 mg | Freq: Every day | RECTAL | Status: DC | PRN

## 2024-01-03 MED ORDER — HEPARIN SODIUM (PORCINE) 5000 UNIT/ML IJ SOLN
5000.0000 [IU] | Freq: Three times a day (TID) | INTRAMUSCULAR | Status: DC
  Administered 2024-01-04 – 2024-01-06 (×7): 5000 [IU] via SUBCUTANEOUS
  Filled 2024-01-03 (×7): qty 1

## 2024-01-03 MED ORDER — SODIUM ZIRCONIUM CYCLOSILICATE 10 G PO PACK
10.0000 g | PACK | Freq: Once | ORAL | Status: AC
  Administered 2024-01-03: 10 g via ORAL
  Filled 2024-01-03: qty 1

## 2024-01-03 MED ORDER — ONDANSETRON HCL 4 MG PO TABS: 4.0000 mg | ORAL_TABLET | Freq: Four times a day (QID) | ORAL | Status: DC | PRN

## 2024-01-03 MED ORDER — ACETAMINOPHEN 500 MG PO TABS
1000.0000 mg | ORAL_TABLET | Freq: Four times a day (QID) | ORAL | Status: DC
  Administered 2024-01-04: 1000 mg via ORAL
  Filled 2024-01-03 (×2): qty 2

## 2024-01-03 MED ORDER — UMECLIDINIUM BROMIDE 62.5 MCG/ACT IN AEPB
1.0000 | INHALATION_SPRAY | Freq: Every day | RESPIRATORY_TRACT | Status: DC
  Filled 2024-01-03: qty 7

## 2024-01-03 MED ORDER — ARFORMOTEROL TARTRATE 15 MCG/2ML IN NEBU
15.0000 ug | INHALATION_SOLUTION | Freq: Two times a day (BID) | RESPIRATORY_TRACT | Status: DC
  Administered 2024-01-04: 15 ug via RESPIRATORY_TRACT
  Filled 2024-01-03 (×5): qty 2

## 2024-01-03 MED ORDER — ALBUTEROL SULFATE (2.5 MG/3ML) 0.083% IN NEBU: 3.0000 mL | INHALATION_SOLUTION | Freq: Four times a day (QID) | RESPIRATORY_TRACT | Status: DC | PRN

## 2024-01-03 MED ORDER — SEVELAMER CARBONATE 800 MG PO TABS
3200.0000 mg | ORAL_TABLET | Freq: Three times a day (TID) | ORAL | Status: DC
  Filled 2024-01-03: qty 4

## 2024-01-03 MED ORDER — MELATONIN 5 MG PO TABS
5.0000 mg | ORAL_TABLET | Freq: Every evening | ORAL | Status: DC | PRN
  Administered 2024-01-04 – 2024-01-05 (×2): 5 mg via ORAL
  Filled 2024-01-03 (×2): qty 1

## 2024-01-03 MED ORDER — SENNA 8.6 MG PO TABS
1.0000 | ORAL_TABLET | Freq: Two times a day (BID) | ORAL | Status: DC
  Administered 2024-01-04 – 2024-01-06 (×6): 8.6 mg via ORAL
  Filled 2024-01-03 (×6): qty 1

## 2024-01-03 MED ORDER — ACETAMINOPHEN 500 MG PO TABS: 1000.0000 mg | ORAL_TABLET | Freq: Four times a day (QID) | ORAL | Status: DC | PRN

## 2024-01-03 MED ORDER — PANTOPRAZOLE SODIUM 40 MG PO TBEC
40.0000 mg | DELAYED_RELEASE_TABLET | Freq: Every day | ORAL | Status: DC
  Administered 2024-01-04 – 2024-01-06 (×3): 40 mg via ORAL
  Filled 2024-01-03 (×3): qty 1

## 2024-01-03 MED ORDER — IOHEXOL 350 MG/ML SOLN
75.0000 mL | Freq: Once | INTRAVENOUS | Status: AC | PRN
  Administered 2024-01-03: 75 mL via INTRAVENOUS

## 2024-01-03 MED ORDER — CINACALCET HCL 30 MG PO TABS
120.0000 mg | ORAL_TABLET | ORAL | Status: DC
  Administered 2024-01-04: 120 mg via ORAL
  Filled 2024-01-03: qty 4

## 2024-01-03 MED ORDER — LATANOPROST 0.005 % OP SOLN
1.0000 [drp] | Freq: Every day | OPHTHALMIC | Status: DC
  Administered 2024-01-04 – 2024-01-05 (×2): 1 [drp] via OPHTHALMIC
  Filled 2024-01-03: qty 2.5

## 2024-01-03 MED ORDER — BISACODYL 10 MG RE SUPP
10.0000 mg | Freq: Once | RECTAL | Status: AC
  Administered 2024-01-04: 10 mg via RECTAL
  Filled 2024-01-03: qty 1

## 2024-01-03 MED ORDER — HYDROXYZINE HCL 25 MG PO TABS: 25.0000 mg | ORAL_TABLET | Freq: Every day | ORAL | Status: DC

## 2024-01-03 MED ORDER — HYDROMORPHONE HCL 1 MG/ML IJ SOLN
0.5000 mg | Freq: Once | INTRAMUSCULAR | Status: AC
  Administered 2024-01-03: 0.5 mg via INTRAVENOUS
  Filled 2024-01-03: qty 1

## 2024-01-03 MED ORDER — ONDANSETRON HCL 4 MG/2ML IJ SOLN: 4.0000 mg | Freq: Four times a day (QID) | INTRAMUSCULAR | Status: DC | PRN

## 2024-01-03 MED ORDER — ACETAMINOPHEN 650 MG RE SUPP: 650.0000 mg | Freq: Four times a day (QID) | RECTAL | Status: DC | PRN

## 2024-01-03 MED ORDER — POLYETHYLENE GLYCOL 3350 17 G PO PACK
17.0000 g | PACK | Freq: Two times a day (BID) | ORAL | Status: DC
  Administered 2024-01-04: 17 g via ORAL
  Filled 2024-01-03 (×6): qty 1

## 2024-01-03 MED ORDER — SODIUM ZIRCONIUM CYCLOSILICATE 10 G PO PACK
10.0000 g | PACK | Freq: Once | ORAL | Status: AC
  Administered 2024-01-04: 10 g via ORAL
  Filled 2024-01-03: qty 1

## 2024-01-03 NOTE — H&P (Cosign Needed Addendum)
 Hospital Admission History and Physical Service Pager: [phone]  Patient name: James Wall [facility] Medical record number: [id number] Date of Birth: [birth year] Age: 58 y.o. Gender: male  Primary Care Provider: Janna Ferrier, DO  Consultants: None Code Status: Full Code   Preferred Emergency Contact:  Contact Information     Name Relation Home Work Mobile   [place] Sister [phone]  [phone]      Other Contacts   None on File      Chief Complaint: abdominal pain   Differential and Medical Decision Making:  James Wall is a 58 y.o. male presenting with left side abdominal pain for the last 3 days. Unable to tolerated PO and c/o abdominal distention .  Differential for this patient's presentation of this includes  Constipation : most likely patient state he has not been able to has a bowel movement in the last 3 days. LLQ w/ pain on light palpation. Patient states this pain similar to constipation pain that he had earlier. No new abnormal finding on CTAP on admission but show stool burden through out colon. ?opioid induced  L renal artery hemorrhage : Less likely, patient H/H stable on admission CTAP shows . No change in the large left perinephric and pararenal hematoma. Stable embolization coils along the left renal artery.  SBO : Less likely. Patient no significant abdominal surgery. CTAP show no SBO   Pancreatitis : less likely: lipase 34 on admission w/ no significant finding on CTAP  Assessment & Plan Abdominal pain Recent admission which he was d/c on 01/02/24. S/p L renal artery embolization with IR 11/21. Repeat CTAP on admission w/ no acute finding. Pain refractory to outpatient management  - Admit to FMTS, attending Dr. Donzetta - treat constipation as below - Vital signs per floor - Pain control: scheduled Tylenol , consider spot dose opioids if needed - AM Labs: CBC, RFP, Mg, PT-INR  - Antemetic : Zofran   - Fall precautions - PT/OT  consult  Constipation State last BM 3 days ago. On opiate therapy for his pain - Dulcolax 10 mg PR once - MiRaLax  17 g BID - Senna 8.6 mg BID QT prolongation Prolonged qtc on most recent EKG 540 Recheck EKG now Hold prolonging agents including home atarax . Zofran  ODT should be ok for now but avoid IV. Monitor EKG if using regularly ESRD (end stage renal disease) (HCC) Potassium 6, BUN 60 sCr 10.7 on Admission. No evidence of acute encephalopathy or hypervolemia. Checking EKG as above - Nephrology consult for HD tomorrow - LOKELMA  10 mg once  - AM RFP and Mg  Chronic health problem First degree AV block : Continue Telemetry, outpatient Zio patch  HFpEF: Daily weight, CTM volume status.  Medications as above. GERD: Continue home Protonix  40 mg daily  HLD: Lipitor  Secondary hyperparathyroidism: Continue home Sensipar  90 mg daily  COPD: Continue home Stiolto Respimat , on formulary Brovana  neb BID and Incruse Ellipta  HTN: Continue home Hydral 25 TID per cardiology,  Continue home amlodipine  10 daily, isosorbide  20 BID.   FEN/GI: CLD w/ 1200 ml restriction VTE Prophylaxis: SQH   Disposition: Telemetry unit  History of Present Illness:  James Wall is a 58 y.o. male presenting with LLQ abdominal pain. Per patient he was recently discharged from the hospital and since then has had debilitating abdominal pain refractory to outpatient medication he was discharged with HYDROcodone -acetaminophen  5-325 MG tablet since discharge patient has not have any BM. James Wall state this feel like a constipation. Per  patient his sister dropped off soup for him earlier today which he tried to eat but ended up threw up His last BM was 3 days ago per patient. He denies fever, shivering, SOB, and Palpitation.   In the ED, he was hemodynamically stable, he received Dilaudid  0.5 mg IV once for pain. His pertinent labs include Sodium 134 , K 6.0 Cl 96 BUN 60 sCr 10.70 GFR 5 T bilirubin 2.6 w/ slightly  elevated AST 58. H/H 11.6/34   Review Of Systems: Per HPI   Pertinent Past Medical History: ERSD Chronic constipation First degree AV block  HFpEF GERD  HLD  Secondary hyperparathyroidism  COPD HTN  Pertinent Past Surgical History: None contributing Remainder reviewed in history tab.   Pertinent Social History: Tobacco use: No Alcohol use: rarely, last drink several weeks ago Other Substance use: occasional marijuana about 1 wk ago Lives alone  Pertinent Family History: None contributing  Important Outpatient Medications: As above   Objective: BP (!) 161/93 (BP Location: Right Arm)   Pulse 70   Temp 97.6 F (36.4 C) (Oral)   Resp 13   Ht 5' 5 (1.651 m)   Wt 86 kg   SpO2 100%   BMI 31.55 kg/m  Exam: General: Resting comfortably in room. CV: Intermittently tachycardic S1/S2. No extra heart sounds. Warm and well-perfused. Pulm: Breathing comfortably on room air. No increased WOB. Abd: Distended L>R sever pain w/ palpation on LLQ  Skin:  Warm, dry. Psych: Crying off and on   Labs:  CBC BMET  Recent Labs  Lab 01/03/24 1724 01/03/24 1741  WBC 8.9  --   HGB 10.1* 11.6*  HCT 32.5* 34.0*  PLT 247  --    Recent Labs  Lab 01/03/24 1724 01/03/24 1741  NA 135 134*  K 5.7* 6.0*  CL 94* 96*  CO2 25  --   BUN 46* 60*  CREATININE 10.09* 10.70*  GLUCOSE 95 89  CALCIUM  10.2  --      EKG (11/29):  Vent. rate 121 BPM  PR interval * ms  QRS duration 130 ms  QT/QTcB 382/542 ms  P-R-T axes * -47 117  Imaging Studies Performed:  EXAM: CT ABDOMEN AND PELVIS WITH CONTRAST  IMPRESSION: 1. Decrease left pleural effusion and lung base atelectasis since the recent prior CT. 2. Decreased ascites since the prior CT. 3. No change in the large left perinephric and pararenal hematoma. Stable embolization coils along the left renal artery. 4. Wall thickening noted along the descending colon on the prior study has improved. This was likely reactive to the  adjacent hematoma. 5. Aortic atherosclerosis. 6. No new abnormalities.    James [place] NOVAK, DO 01/03/2024, 7:35 PM PGY-1, [facility]  FPTS Intern pager: [phone], text pages welcome Secure chat group [facility] Teaching Service      FPTS Upper-Level Resident Addendum   I have independently interviewed and examined the patient. I have discussed the above with Dr. Suzen and agree with the documented plan. My edits for correction/addition/clarification are included above. Please see any attending notes.   Payton Coward, MD PGY-3, [facility] 01/03/2024 10:22 PM  FPTS Service pager: [phone] (text pages welcome through [employer])

## 2024-01-03 NOTE — Assessment & Plan Note (Addendum)
 Potassium 6, BUN 60 sCr 10.7 on Admission. No evidence of acute encephalopathy or hypervolemia. Checking EKG as above - Nephrology consult for HD tomorrow - LOKELMA  10 mg once  - AM RFP and Mg

## 2024-01-03 NOTE — Assessment & Plan Note (Addendum)
 State last BM 3 days ago. On opiate therapy for his pain - Dulcolax 10 mg PR once - MiRaLax  17 g BID - Senna 8.6 mg BID

## 2024-01-03 NOTE — Assessment & Plan Note (Addendum)
 Recent admission which he was d/c on 01/02/24. S/p L renal artery embolization with IR 11/21. Repeat CTAP on admission w/ no acute finding. Pain refractory to outpatient management  - Admit to FMTS, attending Dr. Donzetta - treat constipation as below - Vital signs per floor - Pain control: scheduled Tylenol , consider spot dose opioids if needed - AM Labs: CBC, RFP, Mg, PT-INR  - Antemetic : Zofran   - Fall precautions - PT/OT consult

## 2024-01-03 NOTE — Assessment & Plan Note (Signed)
 Prolonged qtc on most recent EKG 540 Recheck EKG now Hold prolonging agents including home atarax . Zofran  ODT should be ok for now but avoid IV. Monitor EKG if using regularly

## 2024-01-03 NOTE — ED Triage Notes (Signed)
 Pt to ED via EMS with c/o left-sided abdominal pain x3 days. Pt unable to keep down food. +abdominal distention. Hx dialysis. Pt A&Ox4.

## 2024-01-03 NOTE — ED Provider Notes (Signed)
 [place] [facility]ERGENCY DEPARTMENT AT [facility] Provider Note  CSN: [id number] Arrival date & time: 01/03/24 1702  Chief Complaint(s) Abdominal Pain  HPI James Wall is a 58 y.o. male history of prostate cancer, CHF, cirrhosis, COPD, end-stage renal disease on dialysis, prior stroke, recent admission for spontaneous retroperitoneal hematoma status post IR embolization presenting with abdominal pain.  He reports the pain is in the left abdomen.  He reports constant pain.  Reports pain is severe.  He was discharged yesterday.  Reports that he was discharged with Norco and has been trying this but has not been relieving his symptoms.  He reports that he is not able to get around his house due to the pain and does not think he is going to make it to dialysis tomorrow because the pain is more severe.  Denies any fainting, lightheadedness.  No chest pain or shortness of breath.  Reports left-sided flank pain also.  He reports nausea and not being able to eat due to the food, some vomiting.   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 [facility])    primary cancer is prostate   Anemia associated with chronic renal failure    Anxiety    Arthritis    CHF (congestive heart failure) (HCC)    Cirrhosis, nonalcoholic (HCC)    per pt possible from a medication , unsure ;   last ultrasound 08-09-2020 in epic no fibrosis   COPD (chronic obstructive pulmonary disease) (HCC)    Depression    ESRD on hemodialysis (HCC) 2009   Started HD Jan 2009;  ESRD secondary to hypertensive nephrosclerosis;  dialysis at [facility] at [facility] on MWF   First degree heart block    GERD (gastroesophageal reflux disease)    Hiatal hernia    History of acute respiratory failure 07/2013   admission;  HCAP w/ ARF with hypoxia   History of adenomatous polyp of colon    History of ascites    s/p paracentesis  01-31-2013 (5L) and last one 03-28-2013 (2.7L)   History of community acquired pneumonia 08/08/2020   admission ; LLL , POA   History of heart murmur in childhood    History of MRSA infection 12/19/2012   hospital admission;  w/ sepsis MRSA bacteramia AVF infection   History of sepsis 03/2017   admission;   HCAP w/ sepsis   Hyperlipidemia    Hypertension    Hypertensive heart disease    cardiologist--- dr emerson leavens;  nuclear stress test 06-16-2013 intermediate risk w/ mid-distal anterior wall ischemia , not gated;  cardiac cath 07-13-2013 in epic showed normal coronaries and LVSF,  ef not assessed, LCEDP   Hypertensive nephrosclerosis, 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)    Intolerance to cold    due to anemia   Malignant neoplasm prostate [facility])    urologist--- dr bell/  radiation onologist--- dr patrcia;  dx 01/ 2023,  Gleason 4+3, PSA 32   Nausea and vomiting 10/19/2022   NICM (nonischemic cardiomyopathy) (HCC)    followed by cardiology;   last echo in epic 09-13-2020 ef 55-60%   OSA (obstructive sleep apnea) 2009   no  longer using cpap since the yr started 2009;   sleep study in epic 05-11-2007 severe osa   Pneumonia    PSVT (paroxysmal supraventricular tachycardia)    event monitor 08-01-2019  SR w/ SVT  runs , rare PAC/ PVC   Secondary hyperparathyroidism    Stroke (HCC)    2023 weakness Lhand and Left leg   Wears glasses    Patient Active Problem List   Diagnosis Date Noted   Tachycardia 01/02/2024   Abdominal pain 12/29/2023   First degree AV block 12/29/2023   Sinus pause 12/28/2023   Left renal mass 12/28/2023   HTN (hypertension) 12/26/2023   Stable Perinephric hematoma 12/25/2023   Dyspnea 08/20/2023   CHF (congestive heart failure) (HCC) 08/20/2023   Chronic health problem 08/20/2023   Chronic sinus bradycardia 08/20/2023   Constipation 11/06/2022   Lactic acidosis 10/21/2022   Diarrhea 10/21/2022   Nausea and vomiting  10/19/2022   Abnormal finding on CT scan 10/19/2022   Hypertensive retinopathy of both eyes 04/10/2022   Hospital discharge follow-up 10/22/2021   CVA (cerebral vascular accident) (HCC) 09/15/2021   Elevated prostate specific antigen (PSA) 06/12/2021   Malignant neoplasm of prostate (HCC) 03/21/2021   Hypertensive heart disease with heart failure (HCC) 11/01/2020   Microcytic anemia 11/01/2020   Chronic obstructive pulmonary disease (HCC) 08/28/2020   Tobacco abuse 08/28/2020   Dyspnea on exertion 09/22/2019   Hypercalcemia 09/14/2019   Tubular adenoma of colon 12/13/2018   Situational insomnia 09/24/2017   Generalized anxiety disorder 09/17/2017   Bloodstream infection due to central venous catheter, initial encounter 09/16/2017   BPH (benign prostatic hyperplasia) 09/12/2017   Bacteremia associated with intravascular line 09/11/2017   Cirrhosis (HCC)    Complication of AV dialysis fistula 10/22/2015   ESRD (end stage renal disease) on dialysis (HCC) 10/22/2015   Hyperkalemia 10/22/2015   Dependence on renal dialysis 11/28/2014   Coagulation defect, unspecified 02/01/2014   Mild protein-calorie malnutrition 08/03/2013   Abnormal cardiovascular function study 07/12/2013   Abnormal stress test 06/22/2013   Hypothyroidism, unspecified 06/03/2013   Palpitations 05/31/2013   GERD (gastroesophageal reflux disease) 12/21/2012   Anemia in neoplastic disease 12/21/2012   ASTHMA 04/06/2008   SLEEP APNEA 04/06/2008   Secondary hyperparathyroidism of renal origin 03/05/2007   Patient's noncompliance with other medical treatment and regimen 03/05/2007   Personal history of nicotine  dependence 03/05/2007   Hypertensive chronic kidney disease with 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) 02/18/2007   Home Medication(s) Prior to Admission medications   Medication Sig Start Date End Date Taking? Authorizing Provider  albuterol  (VENTOLIN  HFA) 108 (90 Base) MCG/ACT  inhaler Inhale 2 puffs into the lungs every 6 (six) hours as needed for wheezing or shortness of breath. 08/27/23   Howell Lunger, DO  amLODipine  (NORVASC ) 10 MG tablet Take 1 tablet (10 mg total) by mouth at bedtime. 12/05/21   Chandrasekhar, Stanly LABOR, MD  atorvastatin  (LIPITOR) 80 MG tablet Take 1 tablet (80 mg total) by mouth daily. 01/01/24   Cleotilde Perkins, DO  cinacalcet  (SENSIPAR ) 30 MG tablet Take 4 tablets (120 mg total) by mouth every dialysis (give after dialysis with meal). 01/02/24   [name], MD  Doxercalciferol  (HECTOROL  IV) Inject 3 mcg into the vein every Monday, Wednesday, and Friday with hemodialysis. Provided and administered by dialysis clinic on Monday, Wednesday, Friday.    [provider]  ferric citrate  (AURYXIA ) 1 GM 210 MG(Fe) tablet Take 630 mg by mouth 3 (three) times daily with meals. And 210mg  (1 tablet) up to three times a day with snacks.    [provider]  hydrALAZINE  (APRESOLINE ) 25 MG tablet Take 1 tablet (25 mg total) by mouth 3 (three) times  daily. 01/01/24   Cleotilde Perkins, DO  HYDROcodone -acetaminophen  (NORCO/VICODIN) 5-325 MG tablet Take 1 tablet by mouth every 6 (six) hours as needed for moderate pain (pain score 4-6). 01/01/24   Cleotilde Perkins, DO  hydrOXYzine  (ATARAX ) 25 MG tablet Take 25 mg by mouth daily.    [provider]  isosorbide  dinitrate (ISORDIL ) 20 MG tablet Take 1 tablet (20 mg total) by mouth 2 (two) times daily. 12/05/21   Chandrasekhar, Stanly LABOR, MD  latanoprost  (XALATAN ) 0.005 % ophthalmic solution Place 1 drop into both eyes at bedtime. Patient not taking: Reported on 12/26/2023    [provider]  Methoxy PEG-Epoetin  Beta (MIRCERA IJ) Inject 150 mcg into the vein every 14 (fourteen) days. Provided and administered by dialysis clinic every 14 days.    [provider]  ondansetron  (ZOFRAN ) 4 MG tablet Take 1 tablet (4 mg total) by mouth every 8 (eight) hours as needed for nausea or vomiting.  01/01/24   Cleotilde Perkins, DO  pantoprazole  (PROTONIX ) 20 MG tablet TAKE 1 TABLET(20 MG) BY MOUTH DAILY Patient taking differently: Take 40 mg by mouth daily. 01/09/22   [name], DO  polyethylene glycol powder (GLYCOLAX /MIRALAX ) 17 GM/SCOOP powder Take 17 g by mouth daily. Dissolve 1 capful (17g) in 4-8 ounces of liquid and take by mouth daily. 01/01/24   Cleotilde Perkins, DO  polyethylene glycol (MIRALAX  / GLYCOLAX ) 17 g packet Take 17 g by mouth daily. 01/03/24   [name], MD  senna (SENOKOT) 8.6 MG TABS tablet Take 2 tablets (17.2 mg total) by mouth daily. 01/01/24   Cleotilde Perkins, DO  senna (SENOKOT) 8.6 MG TABS tablet Take 2 tablets (17.2 mg total) by mouth daily. 01/03/24   [name], MD  sevelamer  carbonate (RENVELA ) 800 MG tablet Take 3,200 mg by mouth 3 (three) times daily with meals. Patient not taking: Reported on 12/26/2023    [provider]  simethicone  (MYLICON) 40 MG/0.6ML drops Take 0.6 mLs (40 mg total) by mouth 4 (four) times daily as needed for flatulence. 01/02/24   [name], MD  Tiotropium Bromide-Olodaterol (STIOLTO RESPIMAT ) 2.5-2.5 MCG/ACT AERS Inhale 2 puffs into the lungs daily. Patient not taking: Reported on 12/26/2023 08/27/23   Howell Lunger, DO                                                                                                                                    Past Surgical History Past Surgical History:  Procedure Laterality Date   AV FISTULA PLACEMENT Right 02/22/2013   Procedure:  CREATION  OF BRACHIAL CEPHALIC FISTULA RIGHT ARM;  Surgeon: Carlin FORBES Haddock, MD;  Location: [facility] OR;  Service: Vascular;  Laterality: Right;   AV FISTULA PLACEMENT Left 08/10/2014   Procedure: BASILIC VEIN TRANSPOSITION  ARTERIOVENOUS (AV) FISTULA CREATION LEFT UPPER ARM;  Surgeon: Lynwood JONETTA Collum, MD;  Location: [facility] OR;  Service: Vascular;  Laterality: Left;   AV FISTULA PLACEMENT, RADIOCEPHALIC  Left 02/22/2007   @[facility] ;  Left lower arm   COLONOSCOPY   11/30/2018   by dr beavers   CYSTOSCOPY W/ RETROGRADES Bilateral 06/26/2022   Procedure: CYSTOSCOPY WITH BILATERAL RETROGRADE PYELOGRAM;  Surgeon: Carolee Sherwood JONETTA DOUGLAS, MD;  Location: [facility] ORS;  Service: Urology;  Laterality: Bilateral;   ESOPHAGOGASTRODUODENOSCOPY (EGD) WITH PROPOFOL  N/A 04/12/2013   Procedure: ESOPHAGOGASTRODUODENOSCOPY (EGD) WITH PROPOFOL ;  Surgeon: Elsie Cree, MD;  Location: [facility] ENDOSCOPY;  Service: Endoscopy;  Laterality: N/A;   EXCHANGE OF A DIALYSIS CATHETER Right 09/24/2021   Procedure: EXCHANGE OF A DIALYSIS CATHETER;  Surgeon: Eliza Lonni RAMAN, MD;  Location: [facility] OR;  Service: Vascular;  Laterality: Right;   GOLD SEED IMPLANT N/A 06/11/2021   Procedure: GOLD SEED IMPLANT;  Surgeon: Selma Donnice SAUNDERS, MD;  Location: [facility] ORS;  Service: Urology;  Laterality: N/A;   INSERTION OF DIALYSIS CATHETER N/A 12/23/2012   Procedure: INSERTION OF DIALYSIS CATHETER; ULTRASOUND GUIDED;  Surgeon: Lonni RAMAN Eliza, MD;  Location: [facility] OR;  Service: Vascular;  Laterality: N/A;   INSERTION OF DIALYSIS CATHETER  10/22/2015   Right IJ non-tunneled HD catheter, placed again in 2019   IR ANGIOGRAM SELECTIVE EACH ADDITIONAL VESSEL  12/25/2023   IR ANGIOGRAM SELECTIVE EACH ADDITIONAL VESSEL  12/25/2023   IR ANGIOGRAM SELECTIVE EACH ADDITIONAL VESSEL  12/25/2023   IR EMBO ART  VEN HEMORR LYMPH EXTRAV  INC GUIDE ROADMAPPING  12/25/2023   IR FLUORO GUIDE CV LINE RIGHT  08/18/2019   IR FLUORO GUIDE CV LINE RIGHT  10/07/2019   IR GENERIC HISTORICAL  10/22/2015   IR US  GUIDE VASC ACCESS RIGHT 10/22/2015 [facility]   IR GENERIC HISTORICAL  10/22/2015   IR FLUORO GUIDE CV LINE RIGHT 10/22/2015 [facility]   IR GENERIC HISTORICAL  10/23/2015   IR FLUORO GUIDE CV LINE RIGHT 10/23/2015 Rome Hall, MD [facility]   IR RENAL SELECTIVE  UNI INC S&I MOD SED  12/25/2023   IR US  GUIDE VASC ACCESS RIGHT  12/25/2023   LEFT HEART CATHETERIZATION WITH CORONARY ANGIOGRAM N/A 07/13/2013   Procedure: LEFT  HEART CATHETERIZATION WITH CORONARY ANGIOGRAM;  Surgeon: Candyce RAMAN Reek, MD;  Location: [facility] CATH LAB;  Service: Cardiovascular;  Laterality: N/A;   LIGATION OF ARTERIOVENOUS  FISTULA Left 12/22/2012   Procedure: LIGATION OF ARTERIOVENOUS  FISTULA;EXCISION OF LARGE ANEURYSMS;;  Surgeon: Carlin FORBES Haddock, MD;  Location: [facility] OR;  Service: Vascular;  Laterality: Left;   SPACE OAR INSTILLATION N/A 06/11/2021   Procedure: SPACE OAR INSTILLATION;  Surgeon: Selma Donnice SAUNDERS, MD;  Location: [facility] ORS;  Service: Urology;  Laterality: N/A;   UPPER GASTROINTESTINAL ENDOSCOPY  09/07/2018   by dr eda   Family History Family History  Problem Relation Age of Onset   Hypertension Mother    Cerebrovascular Accident Father    Hypertension Father    Congestive Heart Failure Brother    Asthma Brother    Stomach cancer Neg Hx    Rectal cancer Neg Hx    Esophageal cancer Neg Hx    Colon cancer Neg Hx     Social History Social History   Tobacco Use   Smoking status: Former    Current packs/day: 0.50    Average packs/day: 0.5 packs/day for 17.0 years (8.5 ttl pk-yrs)    Types: Cigarettes   Smokeless tobacco: Never   Tobacco comments:    08/27/23 quit 4 weeks ago  Vaping Use   Vaping status: Never Used  Substance Use Topics   Alcohol use: Yes  Comment: Holidays has one drink   Drug use: Yes    Frequency: 4.0 times per week    Types: Marijuana    Comment: 06-06-2021 per pt smokes 2-4 weekly, (half of joint blunt)   Allergies Venofer   [ferric oxide] and Aspirin   Review of Systems Review of Systems  All other systems reviewed and are negative.   Physical Exam Vital Signs  I have reviewed the triage vital signs BP (!) 194/87   Pulse 79   Temp 98 F (36.7 C) (Oral)   Resp 14   Ht 5' 5 (1.651 m)   Wt 86 kg   SpO2 97%   BMI 31.55 kg/m  Physical Exam Vitals and nursing note reviewed.  Constitutional:      General: He is not in acute distress.    Appearance: Normal appearance.   HENT:     Mouth/Throat:     Mouth: Mucous membranes are moist.  Eyes:     Conjunctiva/sclera: Conjunctivae normal.  Cardiovascular:     Rate and Rhythm: Normal rate and regular rhythm.  Pulmonary:     Effort: Pulmonary effort is normal. No respiratory distress.     Breath sounds: Normal breath sounds.  Abdominal:     General: Abdomen is flat.     Palpations: Abdomen is soft.     Tenderness: There is abdominal tenderness in the left upper quadrant and left lower quadrant. There is left CVA tenderness. There is no right CVA tenderness.  Musculoskeletal:     Right lower leg: No edema.     Left lower leg: No edema.  Skin:    General: Skin is warm and dry.     Capillary Refill: Capillary refill takes less than 2 seconds.  Neurological:     Mental Status: He is alert and oriented to person, place, and time. Mental status is at baseline.  Psychiatric:        Mood and Affect: Mood normal.        Behavior: Behavior normal.     ED Results and Treatments Labs (all labs ordered are listed, but only abnormal results are displayed) Labs Reviewed  COMPREHENSIVE METABOLIC PANEL WITH GFR - Abnormal; Notable for the following components:      Result Value   Potassium 5.7 (*)    Chloride 94 (*)    BUN 46 (*)    Creatinine, Ser 10.09 (*)    Albumin  2.8 (*)    AST 58 (*)    Total Bilirubin 2.6 (*)    GFR, Estimated 5 (*)    Anion gap 16 (*)    All other components within normal limits  CBC WITH DIFFERENTIAL/PLATELET - Abnormal; Notable for the following components:   RBC 3.25 (*)    Hemoglobin 10.1 (*)    HCT 32.5 (*)    RDW 18.4 (*)    Lymphs Abs 0.5 (*)    Abs Immature Granulocytes 0.11 (*)    All other components within normal limits  I-STAT CHEM 8, ED - Abnormal; Notable for the following components:   Sodium 134 (*)    Potassium 6.0 (*)    Chloride 96 (*)    BUN 60 (*)    Creatinine, Ser 10.70 (*)    Hemoglobin 11.6 (*)    HCT 34.0 (*)    All other components within  normal limits  LIPASE, BLOOD  TYPE AND SCREEN  Radiology CT ABDOMEN PELVIS W CONTRAST Result Date: 01/03/2024 CLINICAL DATA:  Left-sided abdominal pain for 3 days. Abdominal distension. Unable to keep food down. History of dialysis. EXAM: CT ABDOMEN AND PELVIS WITH CONTRAST TECHNIQUE: Multidetector CT imaging of the abdomen and pelvis was performed using the standard protocol following bolus administration of intravenous contrast. RADIATION DOSE REDUCTION: This exam was performed according to the departmental dose-optimization program which includes automated exposure control, adjustment of the mA and/or kV according to patient size and/or use of iterative reconstruction technique. CONTRAST:  75mL OMNIPAQUE  IOHEXOL  350 MG/ML SOLN COMPARISON:  CT, 12/28/2023. FINDINGS: Lower chest: Trace residual left pleural effusion significantly decreased from the prior study. Left lung base atelectasis has notably improved. Residual bilateral lung base opacities consistent with atelectasis. Focal scarring in the right middle lobe. Heart mildly enlarged. Hepatobiliary: Normal liver. Gallbladder is distended, otherwise unremarkable. No bile duct dilation. Pancreas: Unremarkable. No pancreatic ductal dilatation or surrounding inflammatory changes. Spleen: Normal in size without focal abnormality. Adrenals/Urinary Tract: No adrenal mass. Stomach/Bowel: Large left perinephric hematoma with hemorrhage also extending into the pararenal space. This is without significant change from the prior CT. Marked right renal atrophy. Bilateral renal masses consistent with cysts, many obscured on the left due to the hematoma. Metallic foreign body projects along the medial margin of the left kidney suspected to be embolization coils. This is stable. Ureters are normal in course and in caliber. Bladder is decompressed.  Vascular/Lymphatic: Aortic atherosclerosis. No aneurysm. No enlarged lymph nodes. Reproductive: Prostate fiducial markers.  Prostate normal in size. Other: Trace ascites predominantly collecting adjacent to the liver. This has decreased the previous CT. Bilateral flank subcutaneous edema, greater on the left, also unchanged. Musculoskeletal: No fracture or acute finding. Diffuse sclerotic appearance consistent renal osteodystrophy. IMPRESSION: 1. Decrease left pleural effusion and lung base atelectasis since the recent prior CT. 2. Decreased ascites since the prior CT. 3. No change in the large left perinephric and pararenal hematoma. Stable embolization coils along the left renal artery. 4. Wall thickening noted along the descending colon on the prior study has improved. This was likely reactive to the adjacent hematoma. 5. Aortic atherosclerosis. 6. No new abnormalities. Electronically Signed   By: Alm Parkins M.D.   On: 01/03/2024 18:37    Pertinent labs & imaging results that were available during my care of the patient were reviewed by me and considered in my medical decision making (see MDM for details).  Medications Ordered in ED Medications  iohexol  (OMNIPAQUE ) 350 MG/ML injection 75 mL (75 mLs Intravenous Contrast Given 01/03/24 1752)  HYDROmorphone  (DILAUDID ) injection 0.5 mg (0.5 mg Intravenous Given 01/03/24 1940)  sodium zirconium cyclosilicate  (LOKELMA ) packet 10 g (10 g Oral Given 01/03/24 1943)                                                                                                                                     Procedures Procedures  (  including critical care time)  Medical Decision Making / ED Course   MDM:  58 year old presenting to the emergency department with pain.  Patient with left-sided abdominal pain.  Had recent hospitalization for left-sided retroperitoneal hematoma spontaneously from a renal mass.  Required embolization.  CT scan obtained with no  evidence of acute bleeding, does show chronic findings of retroperitoneal hematoma unchanged from recent imaging.  Labs also notable for signs of end-stage renal disease with mild hyperkalemia.  Patient given pain medication but still complaining of severe pain.  Low concern for other occult process such as infection, ongoing bleeding, abdominal compartment syndrome but given persistent symptoms, discussed with family medicine team who will come and see patient.       Additional history obtained: -Additional history obtained from ems -External records from outside source obtained and reviewed including: Chart review including previous notes, labs, imaging, consultation notes including recent hospital notes    Lab Tests: -I ordered, reviewed, and interpreted labs.   The pertinent results include:   Labs Reviewed  COMPREHENSIVE METABOLIC PANEL WITH GFR - Abnormal; Notable for the following components:      Result Value   Potassium 5.7 (*)    Chloride 94 (*)    BUN 46 (*)    Creatinine, Ser 10.09 (*)    Albumin  2.8 (*)    AST 58 (*)    Total Bilirubin 2.6 (*)    GFR, Estimated 5 (*)    Anion gap 16 (*)    All other components within normal limits  CBC WITH DIFFERENTIAL/PLATELET - Abnormal; Notable for the following components:   RBC 3.25 (*)    Hemoglobin 10.1 (*)    HCT 32.5 (*)    RDW 18.4 (*)    Lymphs Abs 0.5 (*)    Abs Immature Granulocytes 0.11 (*)    All other components within normal limits  I-STAT CHEM 8, ED - Abnormal; Notable for the following components:   Sodium 134 (*)    Potassium 6.0 (*)    Chloride 96 (*)    BUN 60 (*)    Creatinine, Ser 10.70 (*)    Hemoglobin 11.6 (*)    HCT 34.0 (*)    All other components within normal limits  LIPASE, BLOOD  TYPE AND SCREEN    Notable for findings of ESRD, improved hgb   Imaging Studies ordered: I ordered imaging studies including CT scan  On my interpretation imaging demonstrates stable retroperitoneal  hemorrhage  I independently visualized and interpreted imaging. I agree with the radiologist interpretation   Medicines ordered and prescription drug management: Meds ordered this encounter  Medications   iohexol  (OMNIPAQUE ) 350 MG/ML injection 75 mL   HYDROmorphone  (DILAUDID ) injection 0.5 mg   sodium zirconium cyclosilicate  (LOKELMA ) packet 10 g    -I have reviewed the patients home medicines and have made adjustments as needed   Social Determinants of Health:  Diagnosis or treatment significantly limited by social determinants of health: obesity and lives alone   Reevaluation: After the interventions noted above, I reevaluated the patient and found that their symptoms have improved  Co morbidities that complicate the patient evaluation  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 [facility])    primary cancer is prostate   Anemia associated with chronic renal failure    Anxiety    Arthritis    CHF (congestive heart failure) (HCC)    Cirrhosis, nonalcoholic (HCC)    per pt  possible from a medication , unsure ;   last ultrasound 08-09-2020 in epic no fibrosis   COPD (chronic obstructive pulmonary disease) (HCC)    Depression    ESRD on hemodialysis (HCC) 2009   Started HD Jan 2009;  ESRD secondary to hypertensive nephrosclerosis;  dialysis at [facility] on MWF   First degree heart block    GERD (gastroesophageal reflux disease)    Hiatal hernia    History of acute respiratory failure 07/2013   admission;  HCAP w/ ARF with hypoxia   History of adenomatous polyp of colon    History of ascites    s/p paracentesis 01-31-2013 (5L) and last one 03-28-2013 (2.7L)   History of community acquired pneumonia 08/08/2020   admission ; LLL , POA   History of heart murmur in childhood    History of MRSA infection 12/19/2012   hospital admission;  w/ sepsis MRSA bacteramia AVF infection   History of  sepsis 03/2017   admission;   HCAP w/ sepsis   Hyperlipidemia    Hypertension    Hypertensive heart disease    cardiologist--- dr emerson leavens;  nuclear stress test 06-16-2013 intermediate risk w/ mid-distal anterior wall ischemia , not gated;  cardiac cath 07-13-2013 in epic showed normal coronaries and LVSF,  ef not assessed, LCEDP   Hypertensive nephrosclerosis, 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)    Intolerance to cold    due to anemia   Malignant neoplasm prostate [facility])    urologist--- dr bell/  radiation onologist--- dr patrcia;  dx 01/ 2023,  Gleason 4+3, PSA 32   Nausea and vomiting 10/19/2022   NICM (nonischemic cardiomyopathy) (HCC)    followed by cardiology;   last echo in epic 09-13-2020 ef 55-60%   OSA (obstructive sleep apnea) 2009   no  longer using cpap since the yr started 2009;   sleep study in epic 05-11-2007 severe osa   Pneumonia    PSVT (paroxysmal supraventricular tachycardia)    event monitor 08-01-2019  SR w/ SVT runs , rare PAC/ PVC   Secondary hyperparathyroidism    Stroke (HCC)    2023 weakness Lhand and Left leg   Wears glasses       Dispostion: Disposition decision including need for hospitalization was considered, and patient admitted to the hospital.    Final Clinical Impression(s) / ED Diagnoses Final diagnoses:  Post-operative pain  Hyperkalemia     This chart was dictated using voice recognition software.  Despite best efforts to proofread,  errors can occur which can change the documentation meaning.    Francesca Elsie CROME, MD 01/03/24 2004

## 2024-01-03 NOTE — Hospital Course (Signed)
 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 His hospital course is detailed below:  Abdominal pain With history of constipation since last admission. Per patient he has not been able to have a bowel movement in the last 3 days. LLQ w/ pain on light palpation. Patient states this pain similar to constipation pain that he had earlier. No new abnormal finding on CTAP on admission but show stool burden through out colon. Starting bowel regimen on admission and eventually had bowel movement after 2 enemas.  Pain was much improved after resuming diet, and patient was feeling ready for discharge.  ESRD Nephrology was consulted and dialysis was continued while patient was admitted.   3.  Sinus bradycardia with first-degree AV block Evaluated in previous hospitalization by cardiology,.  Of sinus bradycardia to 30s with sinus pause, already planned for outpatient cardiology follow-up and Zio patch.  Other chronic conditions were medically managed with home medications and formulary alternatives as necessary   PCP Follow-up Recommendations: Please make sure he comply with a regular bowel regimen  Ensure cardiology follow up and Zio patch palcement.  Follow up with urology.   Follow up with nephrology.  Encourage outpatient sleep study  Monitor for bleeding signs

## 2024-01-03 NOTE — Assessment & Plan Note (Addendum)
 First degree AV block : Continue Telemetry, outpatient Zio patch  HFpEF: Daily weight, CTM volume status.  Medications as above. GERD: Continue home Protonix  40 mg daily  HLD: Lipitor  Secondary hyperparathyroidism: Continue home Sensipar  90 mg daily  COPD: Continue home Stiolto Respimat , on formulary Brovana  neb BID and Incruse Ellipta  HTN: Continue home Hydral 25 TID per cardiology,  Continue home amlodipine  10 daily, isosorbide  20 BID.

## 2024-01-04 ENCOUNTER — Encounter (HOSPITAL_COMMUNITY): Payer: Self-pay

## 2024-01-04 ENCOUNTER — Observation Stay (HOSPITAL_COMMUNITY)

## 2024-01-04 ENCOUNTER — Other Ambulatory Visit: Payer: Self-pay

## 2024-01-04 DIAGNOSIS — G8918 Other acute postprocedural pain: Secondary | ICD-10-CM | POA: Diagnosis not present

## 2024-01-04 LAB — RENAL FUNCTION PANEL
Albumin: 2.5 g/dL — ABNORMAL LOW (ref 3.5–5.0)
Anion gap: 14 (ref 5–15)
BUN: 50 mg/dL — ABNORMAL HIGH (ref 6–20)
CO2: 26 mmol/L (ref 22–32)
Calcium: 9.6 mg/dL (ref 8.9–10.3)
Chloride: 92 mmol/L — ABNORMAL LOW (ref 98–111)
Creatinine, Ser: 10.55 mg/dL — ABNORMAL HIGH (ref 0.61–1.24)
GFR, Estimated: 5 mL/min — ABNORMAL LOW (ref >60)
Glucose, Bld: 81 mg/dL (ref 70–99)
Phosphorus: 4.4 mg/dL (ref 2.5–4.6)
Potassium: 4.8 mmol/L (ref 3.5–5.1)
Sodium: 132 mmol/L — ABNORMAL LOW (ref 135–145)

## 2024-01-04 LAB — CBC
HCT: 29.4 % — ABNORMAL LOW (ref 39.0–52.0)
Hemoglobin: 9.5 g/dL — ABNORMAL LOW (ref 13.0–17.0)
MCH: 31.9 pg (ref 26.0–34.0)
MCHC: 32.3 g/dL (ref 30.0–36.0)
MCV: 98.7 fL (ref 80.0–100.0)
Platelets: 224 K/uL (ref 150–400)
RBC: 2.98 MIL/uL — ABNORMAL LOW (ref 4.22–5.81)
RDW: 18.2 % — ABNORMAL HIGH (ref 11.5–15.5)
WBC: 9 K/uL (ref 4.0–10.5)
nRBC: 0 % (ref 0.0–0.2)

## 2024-01-04 LAB — LACTIC ACID, PLASMA: Lactic Acid, Venous: 0.7 mmol/L (ref 0.5–1.9)

## 2024-01-04 LAB — MAGNESIUM: Magnesium: 2.1 mg/dL (ref 1.7–2.4)

## 2024-01-04 MED ORDER — SODIUM CHLORIDE 0.9% FLUSH: 10.0000 mL | INTRAVENOUS | Status: DC | PRN

## 2024-01-04 MED ORDER — OXYCODONE HCL 5 MG PO TABS
5.0000 mg | ORAL_TABLET | ORAL | Status: DC | PRN
  Administered 2024-01-04: 5 mg via ORAL
  Filled 2024-01-04: qty 1

## 2024-01-04 MED ORDER — CHLORHEXIDINE GLUCONATE CLOTH 2 % EX PADS
6.0000 | MEDICATED_PAD | Freq: Every day | CUTANEOUS | Status: DC
  Administered 2024-01-04 – 2024-01-06 (×3): 6 via TOPICAL

## 2024-01-04 MED ORDER — HEPARIN SODIUM (PORCINE) 1000 UNIT/ML IJ SOLN
3200.0000 [IU] | Freq: Once | INTRAMUSCULAR | Status: AC
  Administered 2024-01-06: 3200 [IU]

## 2024-01-04 MED ORDER — HYDROMORPHONE HCL 1 MG/ML IJ SOLN
0.5000 mg | Freq: Once | INTRAMUSCULAR | Status: DC
  Filled 2024-01-04: qty 0.5

## 2024-01-04 MED ORDER — GABAPENTIN 100 MG PO CAPS
100.0000 mg | ORAL_CAPSULE | ORAL | Status: DC
  Administered 2024-01-04: 100 mg via ORAL
  Filled 2024-01-04: qty 1

## 2024-01-04 MED ORDER — HEPARIN SODIUM (PORCINE) 1000 UNIT/ML IJ SOLN
INTRAMUSCULAR | Status: AC
  Filled 2024-01-04: qty 4

## 2024-01-04 MED ORDER — LIDOCAINE HCL (PF) 1 % IJ SOLN: 5.0000 mL | INTRAMUSCULAR | Status: DC | PRN

## 2024-01-04 MED ORDER — MILK AND MOLASSES ENEMA
1.0000 | Freq: Once | RECTAL | Status: AC
  Administered 2024-01-04: 150 mL via RECTAL
  Filled 2024-01-04: qty 150

## 2024-01-04 MED ORDER — LIDOCAINE-PRILOCAINE 2.5-2.5 % EX CREA: 1.0000 | TOPICAL_CREAM | CUTANEOUS | Status: DC | PRN

## 2024-01-04 MED ORDER — PENTAFLUOROPROP-TETRAFLUOROETH EX AERO: 1.0000 | INHALATION_SPRAY | CUTANEOUS | Status: DC | PRN

## 2024-01-04 MED ORDER — SIMETHICONE 40 MG/0.6ML PO SUSP
40.0000 mg | Freq: Four times a day (QID) | ORAL | Status: DC | PRN
  Administered 2024-01-04 – 2024-01-05 (×2): 40 mg via ORAL
  Filled 2024-01-04 (×3): qty 0.6

## 2024-01-04 MED ORDER — HEPARIN SODIUM (PORCINE) 1000 UNIT/ML IJ SOLN
INTRAMUSCULAR | Status: AC
  Filled 2024-01-04: qty 6

## 2024-01-04 MED ORDER — HYDROMORPHONE HCL 1 MG/ML IJ SOLN
0.5000 mg | INTRAMUSCULAR | Status: DC | PRN
  Administered 2024-01-04: 0.5 mg via INTRAVENOUS

## 2024-01-04 MED ORDER — SMOG ENEMA
960.0000 mL | Freq: Once | RECTAL | Status: DC
  Filled 2024-01-04: qty 960

## 2024-01-04 MED ORDER — SODIUM CHLORIDE 0.9% FLUSH
10.0000 mL | Freq: Two times a day (BID) | INTRAVENOUS | Status: DC
  Administered 2024-01-04 – 2024-01-06 (×5): 10 mL

## 2024-01-04 MED ORDER — HEPARIN SODIUM (PORCINE) 1000 UNIT/ML DIALYSIS: 1000.0000 [IU] | INTRAMUSCULAR | Status: DC | PRN

## 2024-01-04 MED ORDER — ACETAMINOPHEN 500 MG PO TABS
1000.0000 mg | ORAL_TABLET | Freq: Three times a day (TID) | ORAL | Status: DC
  Administered 2024-01-05 (×2): 1000 mg via ORAL
  Filled 2024-01-04 (×5): qty 2

## 2024-01-04 MED ORDER — ANTICOAGULANT SODIUM CITRATE 4% (200MG/5ML) IV SOLN: 5.0000 mL | Status: DC | PRN

## 2024-01-04 MED ORDER — ALTEPLASE 2 MG IJ SOLR: 2.0000 mg | Freq: Once | INTRAMUSCULAR | Status: DC | PRN

## 2024-01-04 NOTE — Evaluation (Signed)
 Physical Therapy Evaluation Patient Details Name: James Wall MRN: [id number] DOB: 21-Aug-1965 Today's Date: 01/04/2024  History of Present Illness  James Wall is a 58 y.o. male presenting with left side abdominal pain for the last 3 days. Unable to tolerated PO and c/o abdominal distention. PHMx; 11/12 in hospital for left renal embolization due to hematoma; ESRD on HD, prostate CA, anxiety, OA, CHF, COPD, CVA.  Clinical Impression   Pt admitted with above diagnosis. Lives at home alone, in a single-level apartment with no steps to enter, but a long hallway distance to reach hi apartment; Prior to his most recent admission, pt was able to manage independently, using his cane prn; Presents to PT with significant abdominal pain limiting his activity tolerance, endurance, generalized weakness; Needed physical assist to roll to EOB, and elevate trunk to sit; Min assist to standfrom EOB, and able to walk short, in-room distance with RW; So much difficulty managing at home after most recent admisssion, which lead to this readmission; We must recommend post-acute rehab at SNF level (per AIR Admissions Coord, not a candidate for AIR) to maximize independence and safety with mobility and ADLs for safe dc home; Likely pt will decline SNF, and in that case will need to maximize [facility] services;  Pt currently with functional limitations due to the deficits listed below (see PT Problem List). Pt will benefit from skilled PT to increase their independence and safety with mobility to allow discharge to the venue listed below.           If plan is discharge home, recommend the following: A little help with walking and/or transfers;A little help with bathing/dressing/bathroom;Assistance with cooking/housework;Help with stairs or ramp for entrance   Can travel by private vehicle   Yes    Equipment Recommendations Rolling walker (2 wheels) (tub bench)  Recommendations for Other Services        Functional Status Assessment Patient has had a recent decline in their functional status and demonstrates the ability to make significant improvements in function in a reasonable and predictable amount of time.     Precautions / Restrictions Precautions Precautions: Fall Recall of Precautions/Restrictions: Intact Restrictions Weight Bearing Restrictions Per Provider Order: No      Mobility  Bed Mobility Overal bed mobility: Needs Assistance Bed Mobility: Rolling, Sidelying to Sit Rolling: Min assist, Used rails Sidelying to sit: Min assist, Used rails       General bed mobility comments: Cues to use log roll technique to decr work of abdominals in the hopes of reducing discomfrot with getting up to sitting    Transfers Overall transfer level: Needs assistance Equipment used: Rolling walker (2 wheels) Transfers: Sit to/from Stand Sit to Stand: Min assist, From elevated surface           General transfer comment: Min steadying and lifting assist to acheive standing from EOB; cues for hand placement    Ambulation/Gait Ambulation/Gait assistance: Min assist, +2 safety/equipment Gait Distance (Feet): 5 Feet Assistive device: Rolling walker (2 wheels) Gait Pattern/deviations: Step-through pattern, Decreased stride length, Trunk flexed Gait velocity: quite slow and painful     General Gait Details: Pt agreeing to walk in hallway, however once standing he was in too much pain; Able to walk to the sink with RW; had the [facility] close to sit on if needed  Stairs            Wheelchair Mobility     Tilt Bed    Modified Rankin (Stroke Patients  Only)       Balance     Sitting balance-Leahy Scale: Good       Standing balance-Leahy Scale: Poor Standing balance comment: dependent on BUE support                             Pertinent Vitals/Pain Pain Assessment Pain Assessment: Faces Faces Pain Scale: Hurts whole lot Pain Location: Abdominal  pain Pain Descriptors / Indicators: Grimacing, Guarding, Moaning Pain Intervention(s): Limited activity within patient's tolerance    Home Living Family/patient expects to be discharged to:: Private residence Living Arrangements: Alone Available Help at Discharge: Family;Available PRN/intermittently Type of Home: Apartment Home Access: Level entry       Home Layout: One level Home Equipment: Cane - quad;Cane - single point;Rollator (4 wheels)      Prior Function Prior Level of Function : Needs assist             Mobility Comments: pt reports use of cane, is scared of falling with rollator. has to walk a couple hundred feet to leave apt building. ADLs Comments: pt reports Ind with ADLs, assist with IADLs or transportation, used to have assist with meds     Extremity/Trunk Assessment   Upper Extremity Assessment Upper Extremity Assessment: Defer to OT evaluation    Lower Extremity Assessment Lower Extremity Assessment: Generalized weakness       Communication   Communication Communication: No apparent difficulties    Cognition Arousal: Alert Behavior During Therapy: WFL for tasks assessed/performed   PT - Cognitive impairments: No family/caregiver present to determine baseline, No apparent impairments                         Following commands: Intact       Cueing Cueing Techniques: Verbal cues, Visual cues     General Comments General comments (skin integrity, edema, etc.): Moaning, painful throughout session    Exercises     Assessment/Plan    PT Assessment Patient needs continued PT services  PT Problem List Decreased strength;Decreased activity tolerance;Decreased balance;Decreased mobility;Pain       PT Treatment Interventions DME instruction;Gait training;Functional mobility training;Stair training;Therapeutic activities;Therapeutic exercise;Balance training;Patient/family education    PT Goals (Current goals can be found in the  Care Plan section)  Acute Rehab PT Goals Patient Stated Goal: to improve mobility PT Goal Formulation: With patient Time For Goal Achievement: 01/12/24 Potential to Achieve Goals: Good    Frequency Min 2X/week     Co-evaluation               AM-PAC PT 6 Clicks Mobility  Outcome Measure Help needed turning from your back to your side while in a flat bed without using bedrails?: A Little Help needed moving from lying on your back to sitting on the side of a flat bed without using bedrails?: A Little Help needed moving to and from a bed to a chair (including a wheelchair)?: A Little Help needed standing up from a chair using your arms (e.g., wheelchair or bedside chair)?: A Little Help needed to walk in hospital room?: A Lot Help needed climbing 3-5 steps with a railing? : Total 6 Click Score: 15    End of Session Equipment Utilized During Treatment: Gait belt Activity Tolerance: Patient limited by pain Patient left:  (with OT) Nurse Communication: Mobility status PT Visit Diagnosis: Unsteadiness on feet (R26.81);Muscle weakness (generalized) (M62.81);Pain Pain - part of body:  (  Abdominal)    Time: 8954-8895 PT Time Calculation (min) (ACUTE ONLY): 19 min   Charges:   PT Evaluation $PT Eval Moderate Complexity: 1 Mod   PT General Charges $$ ACUTE PT VISIT: 1 Visit         Silvano Currier, PT  Acute Rehabilitation Services Office [phone] Secure Chat welcomed   Silvano VEAR Currier 01/04/2024, 12:32 PM

## 2024-01-04 NOTE — Progress Notes (Signed)
   01/04/24 1735  Vitals  Temp 98.2 F (36.8 C)  BP 129/79  Pulse Rate (!) 122  Resp 14  Oxygen Therapy  SpO2 100 %  O2 Device Nasal Cannula  O2 Flow Rate (L/min) 2 L/min  Patient Activity (if Appropriate) In bed  Pulse Oximetry Type Continuous  Oximetry Probe Site Changed No  Post Treatment  Dialyzer Clearance Clear  Liters Processed 42.3  Fluid Removed (mL) 1900 mL  Tolerated HD Treatment Yes   Pt became tachycardic at the end of tx, asymptomatic, was given 2l of o2. No other events, VSS

## 2024-01-04 NOTE — Assessment & Plan Note (Addendum)
 Recent admission which he was d/c on 01/02/24. S/p L renal artery embolization with IR 11/21. Repeat CTAP on admission w/ no acute finding.  Will plan for increased bowel regiment and continue to monitor - RUQ US  this morning, pending results - Pain control: scheduled Tylenol , consider spot dose opioids if needed - AM Labs: CBC, RFP, Mg, PT-INR  - DC Dilaudid  and start oxycodone  5mg  PO q4h - Change tylenol  to 1000 mg q8h - Start Gabapentin  100 mg MWF after dialysis for exquisite nongeneralized tenderness on exam - Bowel regimen below - PT/OT Rec pending - AM CMP and Mg

## 2024-01-04 NOTE — Evaluation (Signed)
 Occupational Therapy Evaluation Patient Details Name: James Wall MRN: [id number] DOB: [birth year] Today's Date: 01/04/2024   History of Present Illness   James Wall is a 58 y.o. male presenting with left side abdominal pain for the last 3 days. Unable to tolerated PO and c/o abdominal distention. PHMx; 11/12 in hospital for left renal embolization due to hematoma; ESRD on HD, prostate CA, anxiety, OA, CHF, COPD, CVA.     Clinical Impressions This 58 yo male admitted with above presents to acute with PLOF before admitted 12/16/23 of being independent to Mod I with all basic ADLs and IADLs. Currently he is setup-Mod A for basic ADLs and Mod A-CGA for mobility. He will continue to benefit from acute OT with follow up from continued inpatient follow up therapy, <3 hours/day.      If plan is discharge home, recommend the following:   A little help with walking and/or transfers;A lot of help with bathing/dressing/bathroom;Assistance with cooking/housework;Assist for transportation;Help with stairs or ramp for entrance     Functional Status Assessment   Patient has had a recent decline in their functional status and demonstrates the ability to make significant improvements in function in a reasonable and predictable amount of time.     Equipment Recommendations   Tub/shower bench;BSC/3in1 (if he did not get these last admission)      Precautions/Restrictions   Precautions Precautions: Fall Recall of Precautions/Restrictions: Intact Restrictions Weight Bearing Restrictions Per Provider Order: No     Mobility Bed Mobility Overal bed mobility: Needs Assistance Bed Mobility: Sit to Supine       Sit to supine: Mod assist (for legs)        Transfers Overall transfer level: Needs assistance Equipment used: Rolling walker (2 wheels) Transfers: Sit to/from Stand Sit to Stand: Contact guard assist (from 3n1)                  Balance Overall  balance assessment: Needs assistance Sitting-balance support: No upper extremity supported, Feet supported Sitting balance-Leahy Scale: Good     Standing balance support: During functional activity, Bilateral upper extremity supported Standing balance-Leahy Scale: Fair Standing balance comment: propping on sink to spit and rinse mouth                           ADL either performed or assessed with clinical judgement   ADL Overall ADL's : Needs assistance/impaired Eating/Feeding: Independent;Bed level   Grooming: Contact guard assist;Standing;Oral care Grooming Details (indicate cue type and reason): partially--sat to brush, stood to spit and rinse mouth Upper Body Bathing: Set up;Supervision/ safety;Sitting   Lower Body Bathing: Total assistance Lower Body Bathing Details (indicate cue type and reason): CGA sit<>stand Upper Body Dressing : Set up;Supervision/safety;Sitting   Lower Body Dressing: Total assistance Lower Body Dressing Details (indicate cue type and reason): CGA sit<>stand Toilet Transfer: Minimal assistance;Ambulation;Rolling walker (2 wheels)   Toileting- Clothing Manipulation and Hygiene: Moderate assistance Toileting - Clothing Manipulation Details (indicate cue type and reason): CGA sit<>stand             Vision Baseline Vision/History: 1 Wears glasses Patient Visual Report: No change from baseline              Pertinent Vitals/Pain Pain Assessment Pain Assessment: 0-10 Faces Pain Scale: Hurts worst Pain Location: Abdominal pain Pain Descriptors / Indicators: Grimacing, Guarding, Moaning, Crying Pain Intervention(s): Limited activity within patient's tolerance, Monitored during session, Repositioned, Patient requesting pain meds-RN notified  Extremity/Trunk Assessment Upper Extremity Assessment Upper Extremity Assessment: Overall WFL for tasks assessed   Lower Extremity Assessment Lower Extremity Assessment: Generalized  weakness       Communication Communication Communication: No apparent difficulties   Cognition Arousal: Alert Behavior During Therapy: WFL for tasks assessed/performed Cognition: No apparent impairments             OT - Cognition Comments: pt tearful at times when discussing his current level of function and he mentioned recent death of a friend                 Following commands: Intact       Cueing  General Comments   Cueing Techniques: Verbal cues;Visual cues  Moaning, painful throughout session           Home Living Family/patient expects to be discharged to:: Private residence Living Arrangements: Alone Available Help at Discharge: Family;Available PRN/intermittently Type of Home: Apartment Home Access: Level entry     Home Layout: One level     Bathroom Shower/Tub: Tub/shower unit;Sponge bathes at baseline   Bathroom Toilet: Standard Bathroom Accessibility: Yes   Home Equipment: Cane - quad;Cane - single point;Rollator (4 wheels)          Prior Functioning/Environment Prior Level of Function : Needs assist             Mobility Comments: pt reports use of cane, is scared of falling with rollator. has to walk a couple hundred feet to leave apt building. ADLs Comments: pt reports Ind with ADLs, assist with IADLs or transportation, used to have assist with meds (was home less than 24 hours since last admission from 12/11-12/29 so this PLOF is before last admission)    OT Problem List: Decreased strength;Decreased knowledge of use of DME or AE;Decreased activity tolerance;Impaired balance (sitting and/or standing);Pain   OT Treatment/Interventions: Self-care/ADL training;Patient/family education;Splinting;DME and/or AE instruction;Balance training      OT Goals(Current goals can be found in the care plan section)   Acute Rehab OT Goals Patient Stated Goal: to not be in pain OT Goal Formulation: With patient Time For Goal Achievement:  01/18/24 Potential to Achieve Goals: Good   OT Frequency:  Min 2X/week       AM-PAC OT 6 Clicks Daily Activity     Outcome Measure Help from another person eating meals?: None Help from another person taking care of personal grooming?: A Little Help from another person toileting, which includes using toliet, bedpan, or urinal?: A Little Help from another person bathing (including washing, rinsing, drying)?: A Lot Help from another person to put on and taking off regular upper body clothing?: A Little Help from another person to put on and taking off regular lower body clothing?: A Lot 6 Click Score: 17   End of Session Equipment Utilized During Treatment: Gait belt;Rolling walker (2 wheels) Nurse Communication: Mobility status  Activity Tolerance: Patient tolerated treatment well Patient left: in bed;with call bell/phone within reach  OT Visit Diagnosis: Unsteadiness on feet (R26.81);Other abnormalities of gait and mobility (R26.89);Muscle weakness (generalized) (M62.81);Pain Pain - Right/Left: Left Pain - part of body:  (lower abdomen)                Time: 8941-8881 OT Time Calculation (min): 20 min Charges:  OT General Charges $OT Visit: 1 Visit OT Evaluation $OT Eval Moderate Complexity: 1 Mod  Cathy L. OT Acute Rehabilitation Services Office [phone]    Rodgers Dorothyann Distel 01/04/2024, 1:00 PM

## 2024-01-04 NOTE — Plan of Care (Signed)
 Sent secure chat to nephrology provider Dr. Norine for consult for inpatient dialysis. Provider confirmed receiving consult. Appreciate nephrology's recommendations.

## 2024-01-04 NOTE — Assessment & Plan Note (Signed)
 Prolonged qtc on most recent EKG 540, repeat 477 - Will continue to hold prolonging agents including home atarax . Zofran  ODT should be ok for now but avoid IV.  - Monitor EKG if using regularly, has not needed Zofran 

## 2024-01-04 NOTE — Assessment & Plan Note (Addendum)
 First degree AV block : Continue Telemetry, outpatient Zio patch  HFpEF: Daily weight (stable), CTM volume status.  Medications as above. GERD: Continue home Protonix  40 mg daily  HLD: Lipitor  Secondary hyperparathyroidism: Continue home Sensipar  90 mg daily  COPD: Continue home Stiolto Respimat , on formulary Brovana  neb BID and Incruse Ellipta  HTN: Continue home Hydral 25 TID per cardiology,  Continue home amlodipine  10 daily, isosorbide  20 BID.  Elevated BPs today, will monitor after dialysis for improvement

## 2024-01-04 NOTE — TOC Progression Note (Signed)
 Transition of Care [facility]) - Progression Note    Patient Details  Name: James Wall MRN: [id number] Date of Birth: 1965-07-26  Transition of [facility]) CM/SW Contact  Tom-Johnson, Cheridan Kibler Daphne, RN Phone Number: 01/04/2024, 4:51 PM  Clinical Narrative:     CM notified by LCSW that patient is discharging home with home health. Patient has no preference. CM sent referral via hub and spoke with Artavia with [facility] and acceptance voiced, info on AVS. Patient requests for a wheelchair, awaiting MD to place order.   Patient not Medically ready for discharge.  CM will continue to follow as patient progresses with care towards discharge.       Expected Discharge Plan: Home w Home Health Services Barriers to Discharge: Continued Medical Work up               Expected Discharge Plan and Services In-house Referral: Clinical Social Work     Living arrangements for the past 2 months: Single Family Home                                       Social Drivers of Health (SDOH) Interventions SDOH Screenings   Food Insecurity: No Food Insecurity (01/04/2024)  Housing: Low Risk  (01/04/2024)  Transportation Needs: No Transportation Needs (01/04/2024)  Utilities: Not At Risk (01/04/2024)  Alcohol Screen: Low Risk  (08/27/2023)  Depression (PHQ2-9): Medium Risk (12/24/2023)  Financial Resource Strain: Low Risk  (08/27/2023)  Physical Activity: Inactive (08/27/2023)  Social Connections: Moderately Isolated (01/04/2024)  Stress: No Stress Concern Present (08/05/2022)  Tobacco Use: Medium Risk (01/04/2024)  Health Literacy: Adequate Health Literacy (08/27/2023)    Readmission Risk Interventions     No data to display

## 2024-01-04 NOTE — Assessment & Plan Note (Addendum)
 State last BM 3 days ago. On opiate therapy for his pain, s/p Dulcolax suppository with small BM after - Plan for Milk/mollassus enema today - MiRaLax  17 g BID - Senna 8.6 mg BID

## 2024-01-04 NOTE — Progress Notes (Addendum)
 Daily Progress Note Intern Pager: [phone]  Patient name: James Wall [facility] Medical record number: [id number] Date of birth: 08/14/1965 Age: 58 y.o. Gender: male  Primary Care Provider: Janna Ferrier, DO Consultants: Nephrology Code Status: Full  Pt Overview and Major Events to Date:  11/30-admitted to FMTS  Medical Decision Making:   Assessment & Plan Abdominal pain Recent admission which he was d/c on 01/02/24. S/p L renal artery embolization with IR 11/21. Repeat CTAP on admission w/ no acute finding.  Will plan for increased bowel regiment and continue to monitor - RUQ US  this morning, pending results - Pain control: scheduled Tylenol , consider spot dose opioids if needed - AM Labs: CBC, RFP, Mg, PT-INR  - DC Dilaudid  and start oxycodone  5mg  PO q4h - Change tylenol  to 1000 mg q8h - Start Gabapentin  100 mg MWF after dialysis for exquisite nongeneralized tenderness on exam - Bowel regimen below - PT/OT Rec pending - AM CMP and Mg Constipation State last BM 3 days ago. On opiate therapy for his pain, s/p Dulcolax suppository with small BM after - Plan for Milk/mollassus enema today - MiRaLax  17 g BID - Senna 8.6 mg BID QT prolongation Prolonged qtc on most recent EKG 540, repeat 477 - Will continue to hold prolonging agents including home atarax . Zofran  ODT should be ok for now but avoid IV.  - Monitor EKG if using regularly, has not needed Zofran  ESRD (end stage renal disease) (HCC) No evidence of acute encephalopathy or hypervolemia.   Phos 4.4, mag 2.1, hemoglobin 9.5, LA 0.7 - Nephrology consult for HD today - Potassium 4.8 today, down from 6.0., will continue to monitor daily - AM RFP and Mg  Chronic health problem First degree AV block : Continue Telemetry, outpatient Zio patch  HFpEF: Daily weight (stable), CTM volume status.  Medications as above. GERD: Continue home Protonix  40 mg daily  HLD: Lipitor  Secondary hyperparathyroidism: Continue home  Sensipar  90 mg daily  COPD: Continue home Stiolto Respimat , on formulary Brovana  neb BID and Incruse Ellipta  HTN: Continue home Hydral 25 TID per cardiology,  Continue home amlodipine  10 daily, isosorbide  20 BID.  Elevated BPs today, will monitor after dialysis for improvement   FEN/GI: CLD w/ 1200 ml restriction  PPx: Heparin  Dispo:Pending PT recommendations    Subjective:  Worsening left-sided abdominal pain after eating a spoonful of Jell-O this morning, endorses 8 out of 10 constant dull aching pain.  Reports pain feels different from previous abdominal pain.  Reports that he can drink liquids okay with less pain, but with food has worsening abdominal pain.  Has had some elevated blood pressures overnight 194/87 and 194/108, restarted home BP regiment will continue to monitor, BPs looking better this morning.  Reports that he did have a small BM after the Dulcolax suppository yesterday, but felt like it was larger.  Objective: Temp:  [97.6 F (36.4 C)-98.4 F (36.9 C)] 98.1 F (36.7 C) (12/01 0408) Pulse Rate:  [70-86] 74 (12/01 0408) Resp:  [13-20] 18 (12/01 0408) BP: (135-194)/(80-108) 151/80 (12/01 0408) SpO2:  [97 %-100 %] 99 % (12/01 0408) Weight:  [85.4 kg-86 kg] 85.4 kg (11/30 2208) Physical Exam: General: In moderate distress, ill-appearing Cardiovascular: Regular rate rhythm, soft S1, holosystolic murmur 3 out of 6 loudest LUSB Respiratory: Clear to auscultation bilaterally, no wheezes or crackles Abdomen: Exquisite tenderness to light and deep palpation diffusely, though worse RUQ and LUQ. Extremities: Nonedematous, moderately tender diffusely to palpation bilaterally  Laboratory: Most recent CBC Lab  Results  Component Value Date   WBC 9.0 01/04/2024   HGB 9.5 (L) 01/04/2024   HCT 29.4 (L) 01/04/2024   MCV 98.7 01/04/2024   PLT 224 01/04/2024   Most recent BMP    Latest Ref Rng & Units 01/04/2024    4:20 AM  BMP  Glucose 70 - 99 mg/dL 81   BUN 6 - 20 mg/dL 50    Creatinine 9.38 - 1.24 mg/dL 89.44   Sodium 864 - 854 mmol/L 132   Potassium 3.5 - 5.1 mmol/L 4.8   Chloride 98 - 111 mmol/L 92   CO2 22 - 32 mmol/L 26   Calcium  8.9 - 10.3 mg/dL 9.6     Other pertinent labs phosphorus 4.4, magnesium  2.1  Imaging/Diagnostic Tests: No new imaging  Lorrane Pac, MD 01/04/2024, 7:26 AM  PGY-1, [facility] FPTS Intern pager: [phone], text pages welcome Secure chat group [facility] Teaching Service

## 2024-01-04 NOTE — Progress Notes (Signed)
 [facility] Progress Note   Subjective:   Discharged from [facility] 01/02/24 with ABLA secondary to perinephric hematoma, s/p L renal artery embolization. Readmitted  Obs with ongoing abd pain and constipation. Repeat Ab CT negative. Due for dialysis today.  Feeling somewhat better this am, had good BM overnight. Still with some left sided abd pain and feels weak. Denies cp, sob.   Objective Vitals:   01/03/24 2208 01/04/24 0308 01/04/24 0408 01/04/24 0751  BP: (!) 194/108 135/84 (!) 151/80 (!) 147/79  Pulse: 86 71 74 78  Resp: 18 20 18 18   Temp: 98.4 F (36.9 C) 98 F (36.7 C) 98.1 F (36.7 C) 97.8 F (36.6 C)  TempSrc: Oral Oral Oral Oral  SpO2: 100% 100% 99% 99%  Weight: 85.4 kg     Height:       Physical Exam General: alert,nad  Lungs: Clear, normal wob Abdomen: soft, LLQ tenderness Extremities: No sig LE edema  Dialysis Access:  [facility]  Additional Objective Labs: Basic Metabolic Panel: Recent Labs  Lab 01/01/24 1243 01/02/24 0437 01/03/24 1724 01/03/24 1741 01/04/24 0420  NA 135 132* 135 134* 132*  K 5.0 4.2 5.7* 6.0* 4.8  CL 95* 93* 94* 96* 92*  CO2 26 27 25   --  26  GLUCOSE 83 88 95 89 81  BUN 45* 27* 46* 60* 50*  CREATININE 11.16* 7.35* 10.09* 10.70* 10.55*  CALCIUM  9.2 9.1 10.2  --  9.6  PHOS 3.6 3.1  --   --  4.4   Liver Function Tests: Recent Labs  Lab 01/02/24 0437 01/03/24 1724 01/04/24 0420  AST  --  58*  --   ALT  --  28  --   ALKPHOS  --  107  --   BILITOT  --  2.6*  --   PROT  --  7.3  --   ALBUMIN  2.5* 2.8* 2.5*   Recent Labs  Lab 01/03/24 1827  LIPASE 34   CBC: Recent Labs  Lab 01/01/24 0535 01/01/24 1243 01/02/24 0437 01/03/24 1724 01/03/24 1741 01/04/24 0420  WBC 7.7 7.7 8.7 8.9  --  9.0  NEUTROABS  --   --   --  7.3  --   --   HGB 9.1* 8.8* 8.9* 10.1* 11.6* 9.5*  HCT 28.2* 27.5* 27.3* 32.5* 34.0* 29.4*  MCV 97.9 98.6 98.6 100.0  --  98.7  PLT 188 196 192 247  --  224   Blood Culture    Component Value  Date/Time   SDES BLOOD SITE NOT SPECIFIED 10/19/2022 1501   SPECREQUEST  10/19/2022 1501    BOTTLES DRAWN AEROBIC AND ANAEROBIC Blood Culture results may not be optimal due to an excessive volume of blood received in culture bottles   CULT  10/19/2022 1501    NO GROWTH 5 DAYS Performed at [facility], [address], [place], [place] [address]    REPTSTATUS 10/24/2022 FINAL 10/19/2022 1501    Cardiac Enzymes: No results for input(s): CKTOTAL, CKMB, CKMBINDEX, TROPONINI in the last 168 hours. CBG: No results for input(s): GLUCAP in the last 168 hours. Iron  Studies: No results for input(s): IRON , TIBC, TRANSFERRIN, FERRITIN in the last 72 hours. @lablastinr3 @ Studies/Results: CT ABDOMEN PELVIS W CONTRAST Result Date: 01/03/2024 CLINICAL DATA:  Left-sided abdominal pain for 3 days. Abdominal distension. Unable to keep food down. History of dialysis. EXAM: CT ABDOMEN AND PELVIS WITH CONTRAST TECHNIQUE: Multidetector CT imaging of the abdomen and pelvis was performed using the standard protocol  following bolus administration of intravenous contrast. RADIATION DOSE REDUCTION: This exam was performed according to the departmental dose-optimization program which includes automated exposure control, adjustment of the mA and/or kV according to patient size and/or use of iterative reconstruction technique. CONTRAST:  75mL OMNIPAQUE  IOHEXOL  350 MG/ML SOLN COMPARISON:  CT, 12/28/2023. FINDINGS: Lower chest: Trace residual left pleural effusion significantly decreased from the prior study. Left lung base atelectasis has notably improved. Residual bilateral lung base opacities consistent with atelectasis. Focal scarring in the right middle lobe. Heart mildly enlarged. Hepatobiliary: Normal liver. Gallbladder is distended, otherwise unremarkable. No bile duct dilation. Pancreas: Unremarkable. No pancreatic ductal dilatation or surrounding inflammatory changes. Spleen: Normal in size  without focal abnormality. Adrenals/Urinary Tract: No adrenal mass. Stomach/Bowel: Large left perinephric hematoma with hemorrhage also extending into the pararenal space. This is without significant change from the prior CT. Marked right renal atrophy. Bilateral renal masses consistent with cysts, many obscured on the left due to the hematoma. Metallic foreign body projects along the medial margin of the left kidney suspected to be embolization coils. This is stable. Ureters are normal in course and in caliber. Bladder is decompressed. Vascular/Lymphatic: Aortic atherosclerosis. No aneurysm. No enlarged lymph nodes. Reproductive: Prostate fiducial markers.  Prostate normal in size. Other: Trace ascites predominantly collecting adjacent to the liver. This has decreased the previous CT. Bilateral flank subcutaneous edema, greater on the left, also unchanged. Musculoskeletal: No fracture or acute finding. Diffuse sclerotic appearance consistent renal osteodystrophy. IMPRESSION: 1. Decrease left pleural effusion and lung base atelectasis since the recent prior CT. 2. Decreased ascites since the prior CT. 3. No change in the large left perinephric and pararenal hematoma. Stable embolization coils along the left renal artery. 4. Wall thickening noted along the descending colon on the prior study has improved. This was likely reactive to the adjacent hematoma. 5. Aortic atherosclerosis. 6. No new abnormalities. Electronically Signed   By: Alm Parkins M.D.   On: 01/03/2024 18:37   Medications:   acetaminophen   1,000 mg Oral Q6H   amLODipine   10 mg Oral QHS   arformoterol   15 mcg Nebulization BID   atorvastatin   80 mg Oral Daily   Chlorhexidine  Gluconate Cloth  6 each Topical Daily   cinacalcet   120 mg Oral Q M,W,F-1800   heparin   5,000 Units Subcutaneous Q8H   hydrALAZINE   25 mg Oral TID   isosorbide  dinitrate  20 mg Oral BID   latanoprost   1 drop Both Eyes QHS   milk and molasses  1 enema Rectal Once    pantoprazole   40 mg Oral Daily   polyethylene glycol  17 g Oral BID   senna  1 tablet Oral BID   sevelamer  carbonate  3,200 mg Oral TID WC   sodium chloride  flush  10-40 mL Intracatheter Q12H   umeclidinium bromide   1 puff Inhalation Daily    Dialysis Orders: [facility] MWF 4h  B400  86.7kg  2K bath  TDC  Heparin  4500 + 2000 midrun Last OP HD 11/19, post wt 90.8kg (+4kg) Mircera 150mcg q2wks - last 10/27 Hectorol  4mcg qHD Sensipar  120mg  qHD Usually doesn't get to dry wt, come off 1-6 kg over  Signs off early at 2.5- 3.5 hrs usually  Assessment/Plan: Abdominal pain s/p L renal artery embolization procedure on 12/25/23. Ongoing pain and constipation. Per primary team.  ESRD: on HD MWF. HD Monday HTN: BP controlled, continue home meds and UF with HD as tolerated Volume: Is below EDW now. No volume excess on  exam.  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 ESA restarted 11/22 Secondary hyperparathyroidism - CorrCa  elevated. Phos acceptable. Continue Sensipar . Sevelamer  causing constipation - hold for now.  Constipation - r/t binders per patient. Improving on bowel regimen here.   Maisie Ronnald Acosta PA-C [facility] 01/04/2024,9:00 AM

## 2024-01-04 NOTE — TOC Initial Note (Signed)
 Transition of Care [facility]) - Initial/Assessment Note    Patient Details  Name: James Wall MRN: [id number] Date of Birth: 1965-11-14  Transition of Care [facility]) CM/SW Contact:    James Wall, LCSWA Phone Number: 01/04/2024, 3:34 PM  Clinical Narrative:  CSW spoke to pt at bedside and completed initial assessment.  Pt is from home alone, independent with ADL's, and uses access [place] ([facility]) to get to HD. Pt stated that his sister will give transportation to medical appointments. Pt's sister James Wall is the primary contact and gave the CSW permission to call.      Pt states they take meds as prescribed and has seen PCP Dr James Wall in the last 12 months. Pt has DME of 2 canes from their mother and a rollator. Pt inquired about getting and wheelchair and CSW stated they can let the RNCM know. RNCM notified.  Pt's source of income is disability and states they are able to afford basic needs and has no SDOH concerns.  PT RECS  CSW informed pt of PT recs of SNF. Pt declined SNF d/t talking to their friends and hearing bad experiences and stated they would rather go home. CSW stated the difference between [facility] vs SNF and risk of declining SNF. Pt stated understanding and chose to dc home when ready.   Pt stated their sister James Wall and friends. CSW spoke to [employer] via phone and stated she would be willing to help the patient as much as she could. James Wall stated that she works and takes care of her granddaughter.            TOC will continue to montior.    Expected Discharge Plan: Home w Home Health Services Barriers to Discharge: Continued Medical Work up   Patient Goals and CMS Choice Patient states their goals for this hospitalization and ongoing recovery are:: to lessen Dr visits.          Expected Discharge Plan and Services In-house Referral: Clinical Social Work     Living arrangements for the past 2 months: Single Family Home                                       Prior Living Arrangements/Services Living arrangements for the past 2 months: Single Family Home Lives with:: Self Patient language and need for interpreter reviewed:: Yes Do you feel safe going back to the place where you live?: Yes      Need for Family Participation in Patient Care: Yes (Comment) Care giver support system in place?: Yes (comment) Current home services: DME Criminal Activity/Legal Involvement Pertinent to Current Situation/Hospitalization: No - Comment as needed  Activities of Daily Living   ADL Screening (condition at time of admission) Independently performs ADLs?: Yes (appropriate for developmental age) Is the patient deaf or have difficulty hearing?: No Does the patient have difficulty seeing, even when wearing glasses/contacts?: No Does the patient have difficulty concentrating, remembering, or making decisions?: No  Permission Sought/Granted Permission sought to share information with : Family Supports Permission granted to share information with : Yes, Verbal Permission Granted  Share Information with NAME: James Wall     Permission granted to share info w Relationship: Sister  Permission granted to share info w Contact Information: [phone]  Emotional Assessment Appearance:: Appears older than stated age Attitude/Demeanor/Rapport: Engaged Affect (typically observed): Calm, Appropriate Orientation: : Oriented to Self, Oriented to Place, Oriented  to  Time, Oriented to Situation Alcohol / Substance Use: Not Applicable Psych Involvement: No (comment)  Admission diagnosis:  Hyperkalemia [E87.5] Post-operative pain [G89.18] Abdominal pain [R10.9] Patient Active Problem List   Diagnosis Date Noted   Post-operative pain 01/04/2024   QT prolongation 01/03/2024   Tachycardia 01/02/2024   Abdominal pain 12/29/2023   First degree AV block 12/29/2023   Sinus pause 12/28/2023   Left renal mass 12/28/2023   HTN (hypertension) 12/26/2023    Stable Perinephric hematoma 12/25/2023   Dyspnea 08/20/2023   CHF (congestive heart failure) (HCC) 08/20/2023   Chronic health problem 08/20/2023   Chronic sinus bradycardia 08/20/2023   Constipation 11/06/2022   Lactic acidosis 10/21/2022   Diarrhea 10/21/2022   Nausea and vomiting 10/19/2022   Abnormal finding on CT scan 10/19/2022   Hypertensive retinopathy of both eyes 04/10/2022   Hospital discharge follow-up 10/22/2021   CVA (cerebral vascular accident) (HCC) 09/15/2021   Elevated prostate specific antigen (PSA) 06/12/2021   Malignant neoplasm of prostate (HCC) 03/21/2021   Hypertensive heart disease with heart failure (HCC) 11/01/2020   Microcytic anemia 11/01/2020   Chronic obstructive pulmonary disease (HCC) 08/28/2020   Tobacco abuse 08/28/2020   Dyspnea on exertion 09/22/2019   Hypercalcemia 09/14/2019   Tubular adenoma of colon 12/13/2018   Situational insomnia 09/24/2017   Generalized anxiety disorder 09/17/2017   Bloodstream infection due to central venous catheter, initial encounter 09/16/2017   BPH (benign prostatic hyperplasia) 09/12/2017   Bacteremia associated with intravascular line 09/11/2017   Cirrhosis (HCC)    Complication of AV dialysis fistula 10/22/2015   ESRD (end stage renal disease) (HCC) 10/22/2015   Hyperkalemia 10/22/2015   Dependence on renal dialysis 11/28/2014   Coagulation defect, unspecified 02/01/2014   Mild protein-calorie malnutrition 08/03/2013   Abnormal cardiovascular function study 07/12/2013   Abnormal stress test 06/22/2013   Hypothyroidism, unspecified 06/03/2013   Palpitations 05/31/2013   GERD (gastroesophageal reflux disease) 12/21/2012   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 - [address] AT [facility] [address] [place] [address] Phone: [phone] Fax: [phone]  [facility] [address] [place] [address] Phone: [phone] Fax: [phone]     Social Drivers of Health (SDOH) Social History: SDOH Screenings   Food Insecurity: No Food Insecurity (01/04/2024)  Housing: Low Risk  (01/04/2024)  Transportation Needs: No Transportation Needs (01/04/2024)  Utilities: Not At Risk (01/04/2024)  Alcohol Screen: Low Risk  (08/27/2023)  Depression (PHQ2-9): Medium Risk (12/24/2023)  Financial Resource Strain: Low Risk  (08/27/2023)  Physical Activity: Inactive (08/27/2023)  Social Connections: Moderately Isolated (01/04/2024)  Stress: No Stress Concern Present (08/05/2022)  Tobacco Use: Medium Risk (01/04/2024)  Health Literacy: Adequate Health Literacy (08/27/2023)   SDOH Interventions:     Readmission Risk Interventions     No data to display

## 2024-01-04 NOTE — Assessment & Plan Note (Signed)
 No evidence of acute encephalopathy or hypervolemia.   Phos 4.4, mag 2.1, hemoglobin 9.5, LA 0.7 - Nephrology consult for HD today - Potassium 4.8 today, down from 6.0., will continue to monitor daily - AM RFP and Mg

## 2024-01-04 NOTE — Progress Notes (Signed)
 Pt receives out-pt HD at [facility] on MWF 5:40 am chair time. Will assist as needed.   Randine Mungo Dialysis Navigator [phone]

## 2024-01-05 ENCOUNTER — Observation Stay (HOSPITAL_COMMUNITY)

## 2024-01-05 ENCOUNTER — Inpatient Hospital Stay: Payer: Self-pay | Admitting: Family Medicine

## 2024-01-05 DIAGNOSIS — G8918 Other acute postprocedural pain: Secondary | ICD-10-CM | POA: Diagnosis not present

## 2024-01-05 LAB — CBC WITH DIFFERENTIAL/PLATELET
Abs Immature Granulocytes: 0.08 K/uL — ABNORMAL HIGH (ref 0.00–0.07)
Basophils Absolute: 0.1 K/uL (ref 0.0–0.1)
Basophils Relative: 1 %
Eosinophils Absolute: 0 K/uL (ref 0.0–0.5)
Eosinophils Relative: 0 %
HCT: 30.5 % — ABNORMAL LOW (ref 39.0–52.0)
Hemoglobin: 9.7 g/dL — ABNORMAL LOW (ref 13.0–17.0)
Immature Granulocytes: 1 %
Lymphocytes Relative: 6 %
Lymphs Abs: 0.6 K/uL — ABNORMAL LOW (ref 0.7–4.0)
MCH: 31.9 pg (ref 26.0–34.0)
MCHC: 31.8 g/dL (ref 30.0–36.0)
MCV: 100.3 fL — ABNORMAL HIGH (ref 80.0–100.0)
Monocytes Absolute: 0.7 K/uL (ref 0.1–1.0)
Monocytes Relative: 7 %
Neutro Abs: 8.3 K/uL — ABNORMAL HIGH (ref 1.7–7.7)
Neutrophils Relative %: 85 %
Platelets: 255 K/uL (ref 150–400)
RBC: 3.04 MIL/uL — ABNORMAL LOW (ref 4.22–5.81)
RDW: 18.3 % — ABNORMAL HIGH (ref 11.5–15.5)
WBC: 9.7 K/uL (ref 4.0–10.5)
nRBC: 0 % (ref 0.0–0.2)

## 2024-01-05 LAB — COMPREHENSIVE METABOLIC PANEL WITH GFR
ALT: 20 U/L (ref 0–44)
AST: 34 U/L (ref 15–41)
Albumin: 2.5 g/dL — ABNORMAL LOW (ref 3.5–5.0)
Alkaline Phosphatase: 115 U/L (ref 38–126)
Anion gap: 14 (ref 5–15)
BUN: 34 mg/dL — ABNORMAL HIGH (ref 6–20)
CO2: 25 mmol/L (ref 22–32)
Calcium: 9 mg/dL (ref 8.9–10.3)
Chloride: 94 mmol/L — ABNORMAL LOW (ref 98–111)
Creatinine, Ser: 8.19 mg/dL — ABNORMAL HIGH (ref 0.61–1.24)
GFR, Estimated: 7 mL/min — ABNORMAL LOW (ref >60)
Glucose, Bld: 92 mg/dL (ref 70–99)
Potassium: 4.6 mmol/L (ref 3.5–5.1)
Sodium: 133 mmol/L — ABNORMAL LOW (ref 135–145)
Total Bilirubin: 2.5 mg/dL — ABNORMAL HIGH (ref 0.0–1.2)
Total Protein: 6.7 g/dL (ref 6.5–8.1)

## 2024-01-05 LAB — TROPONIN I (HIGH SENSITIVITY)
Troponin I (High Sensitivity): 56 ng/L — ABNORMAL HIGH (ref <18)
Troponin I (High Sensitivity): 69 ng/L — ABNORMAL HIGH (ref <18)
Troponin I (High Sensitivity): 63 ng/L — ABNORMAL HIGH (ref <18)

## 2024-01-05 LAB — MAGNESIUM: Magnesium: 2.1 mg/dL (ref 1.7–2.4)

## 2024-01-05 MED ORDER — IOHEXOL 350 MG/ML SOLN
75.0000 mL | Freq: Once | INTRAVENOUS | Status: AC | PRN
  Administered 2024-01-05: 75 mL via INTRAVENOUS

## 2024-01-05 MED ORDER — SODIUM CHLORIDE 0.9 % IV BOLUS
250.0000 mL | Freq: Once | INTRAVENOUS | Status: AC
  Administered 2024-01-05: 250 mL via INTRAVENOUS

## 2024-01-05 MED ORDER — OXYCODONE HCL 5 MG PO TABS
5.0000 mg | ORAL_TABLET | Freq: Three times a day (TID) | ORAL | Status: DC | PRN
  Administered 2024-01-06: 5 mg via ORAL
  Filled 2024-01-05: qty 1

## 2024-01-05 MED ORDER — HYDROXYZINE HCL 25 MG PO TABS
25.0000 mg | ORAL_TABLET | Freq: Three times a day (TID) | ORAL | Status: DC | PRN
  Administered 2024-01-05: 25 mg via ORAL
  Filled 2024-01-05: qty 1

## 2024-01-05 NOTE — Assessment & Plan Note (Signed)
 First degree AV block : Continue Telemetry, outpatient Zio patch  HFpEF: Daily weight (stable), CTM volume status.  Medications as above. GERD: Continue home Protonix  40 mg daily  HLD: Lipitor  Secondary hyperparathyroidism: Continue home Sensipar  90 mg daily  COPD: Continue home Stiolto Respimat , on formulary Brovana  neb BID and Incruse Ellipta  HTN: Continue home Hydral 25 TID per cardiology,  Continue home amlodipine  10 daily, isosorbide  20 BID.  Elevated BPs today, will monitor after dialysis for improvement

## 2024-01-05 NOTE — Assessment & Plan Note (Addendum)
 Nephrology following and on hemodialysis schedule MWF, plan for less UF with HD in next session given events overnight - Weight is up to 90.7 kg yesterday afternoon, last weight Sunday was 85.4 kg, that was predialysis weight so we will follow-up today - Nephrology following, received HD yesterday - Potassium 4.8 today, down from 6.0., will continue to monitor daily - AM RFP and Mg

## 2024-01-05 NOTE — Progress Notes (Signed)
 Occupational Therapy Treatment Patient Details Name: James Wall MRN: [id number] DOB: 1965-07-13 Today's Date: 01/05/2024   History of present illness James Wall is a 58 y.o. male presenting with left side abdominal pain for the last 3 days. Unable to tolerated PO and c/o abdominal distention. PHMx; 11/12 in hospital for left renal embolization due to hematoma; ESRD on HD, prostate CA, anxiety, OA, CHF, COPD, CVA.   OT comments  Patient received in supine and eager to participate. Patient able to get to EOB with min assist and increased time due to abdominal pain.  Patient able to transfer to recliner with CGA with RW.  Education and demonstration provided for AE training with LB dressing with reacher and sock aide.  Patient was able to return demonstration with min assist for socks and mod assist with pants. Discharge recommendations changed to [facility] from SNF due to progress.  Acute OT to continue to follow to address established goals.       If plan is discharge home, recommend the following:  A little help with walking and/or transfers;A lot of help with bathing/dressing/bathroom;Assistance with cooking/housework;Assist for transportation;Help with stairs or ramp for entrance   Equipment Recommendations  Tub/shower bench;BSC/3in1 (if he did not get these last admission)    Recommendations for Other Services      Precautions / Restrictions Precautions Precautions: Fall Recall of Precautions/Restrictions: Intact Restrictions Weight Bearing Restrictions Per Provider Order: No       Mobility Bed Mobility Overal bed mobility: Needs Assistance Bed Mobility: Sit to Supine     Supine to sit: Min assist     General bed mobility comments: increased time and assistance with scooting to EOB    Transfers Overall transfer level: Needs assistance Equipment used: Rolling walker (2 wheels) Transfers: Sit to/from Stand, Bed to chair/wheelchair/BSC Sit to Stand: Contact  guard assist, From elevated surface     Step pivot transfers: Contact guard assist     General transfer comment: CGA for safety     Balance Overall balance assessment: Needs assistance Sitting-balance support: No upper extremity supported, Feet supported Sitting balance-Leahy Scale: Good Sitting balance - Comments: EOB   Standing balance support: Single extremity supported, Bilateral upper extremity supported, During functional activity Standing balance-Leahy Scale: Fair Standing balance comment: able to stand to pull up clothing with CGA                           ADL either performed or assessed with clinical judgement   ADL Overall ADL's : Needs assistance/impaired     Grooming: Wash/dry hands;Wash/dry face;Set up;Sitting Grooming Details (indicate cue type and reason): on EOB             Lower Body Dressing: Moderate assistance;With adaptive equipment;Sit to/from stand Lower Body Dressing Details (indicate cue type and reason): education on reacher and sock aide use with min assist with socks.  Mod assist to with pants and reacher                    Extremity/Trunk Assessment              Vision       Perception     Praxis     Communication Communication Communication: No apparent difficulties   Cognition Arousal: Alert Behavior During Therapy: WFL for tasks assessed/performed Cognition: No apparent impairments  Following commands: Intact        Cueing   Cueing Techniques: Verbal cues, Visual cues  Exercises      Shoulder Instructions       General Comments patient tearful when doctor arrived when discussing his out of body experience last night.    Pertinent Vitals/ Pain       Pain Assessment Pain Assessment: Faces Faces Pain Scale: Hurts little more Pain Location: Abdominal pain Pain Descriptors / Indicators: Grimacing, Guarding Pain Intervention(s): Limited activity  within patient's tolerance, Monitored during session, Repositioned  Home Living                                          Prior Functioning/Environment              Frequency  Min 2X/week        Progress Toward Goals  OT Goals(current goals can now be found in the care plan section)  Progress towards OT goals: Progressing toward goals  Acute Rehab OT Goals Patient Stated Goal: to get better OT Goal Formulation: With patient Time For Goal Achievement: 01/18/24 Potential to Achieve Goals: Good ADL Goals Pt Will Perform Grooming: Independently;standing Pt Will Perform Upper Body Bathing: with supervision;with set-up;sitting Pt Will Perform Lower Body Bathing: with modified independence;with adaptive equipment;sit to/from stand Pt Will Perform Upper Body Dressing: with supervision;with set-up;sitting Pt Will Perform Lower Body Dressing: with modified independence;sit to/from stand;with adaptive equipment Pt Will Transfer to Toilet: with modified independence;ambulating;bedside commode (over toilet) Pt Will Perform Toileting - Clothing Manipulation and hygiene: with modified independence;sit to/from stand Additional ADL Goal #1: Pt will be Mod I in and OOB for basic ADLs (increased time)  Plan      Co-evaluation                 AM-PAC OT 6 Clicks Daily Activity     Outcome Measure   Help from another person eating meals?: None Help from another person taking care of personal grooming?: A Little Help from another person toileting, which includes using toliet, bedpan, or urinal?: A Little Help from another person bathing (including washing, rinsing, drying)?: A Lot Help from another person to put on and taking off regular upper body clothing?: A Little Help from another person to put on and taking off regular lower body clothing?: A Lot 6 Click Score: 17    End of Session Equipment Utilized During Treatment: Gait belt;Rolling walker (2  wheels)  OT Visit Diagnosis: Unsteadiness on feet (R26.81);Other abnormalities of gait and mobility (R26.89);Muscle weakness (generalized) (M62.81);Pain Pain - part of body:  (abdomen)   Activity Tolerance Patient tolerated treatment well   Patient Left in chair;with call bell/phone within reach;Other (comment) (left with PT)   Nurse Communication Mobility status        Time: 8992-8961 OT Time Calculation (min): 31 min  Charges: OT General Charges $OT Visit: 1 Visit OT Treatments $Self Care/Home Management : 23-37 mins  Dick Laine, OTA Acute Rehabilitation Services  Office [phone]   Jeb LITTIE Laine 01/05/2024, 1:36 PM

## 2024-01-05 NOTE — Progress Notes (Signed)
 Physical Therapy Treatment Patient Details Name: James Wall MRN: [id number] DOB: Sep 13, 1965 Today's Date: 01/05/2024   History of Present Illness James Wall is a 58 y.o. male presenting with left side abdominal pain for the last 3 days. Unable to tolerated PO and c/o abdominal distention. PHMx; 11/12 in hospital for left renal embolization due to hematoma; ESRD on HD, prostate CA, anxiety, OA, CHF, COPD, CVA.    PT Comments  Continuing work on functional mobility and activity tolerance;  session focused on progressive amb, rollator trial, and to take a close watch of vitals in light of his presyncopal-like episode overnight; No BP drop with upright activity noted, as well as no syncopal symptoms; Overall improving, and we discussed strategies for managing longer distances when fatigued    If plan is discharge home, recommend the following: A little help with walking and/or transfers;A little help with bathing/dressing/bathroom;Assistance with cooking/housework;Help with stairs or ramp for entrance   Can travel by private vehicle     Yes  Equipment Recommendations  Rolling walker (2 wheels) (tub bench)    Recommendations for Other Services       Precautions / Restrictions Precautions Precautions: Fall Recall of Precautions/Restrictions: Intact Restrictions Weight Bearing Restrictions Per Provider Order: No     Mobility  Bed Mobility                    Transfers Overall transfer level: Needs assistance Equipment used: Rollator (4 wheels) Transfers: Sit to/from Stand Sit to Stand: Contact guard assist (Close standby, without physical contact)           General transfer comment: Good carryover of hand placement cues; needed safety cue to lock rollator before taking a seat; stood x2 from [employer]    Ambulation/Gait Ambulation/Gait assistance: Contact guard assist Gait Distance (Feet): 50 Feet (15, seated rest, 35) Assistive device: Rollator (4  wheels) Gait Pattern/deviations: Step-through pattern, Decreased stride length, Trunk flexed Gait velocity: Slow, but improved compared to yesterday     General Gait Details: Adjusted handle height for optimal fit; Cues to self-monitor for activity tolerance, and to keep rollator close; Sat in rollator and pushed himself back to the room to practice an option for getting to his apartment when he is extremely fatigued   Stairs             Wheelchair Mobility     Tilt Bed    Modified Rankin (Stroke Patients Only)       Balance     Sitting balance-Leahy Scale: Good       Standing balance-Leahy Scale: Fair                              Hotel Manager: No apparent difficulties  Cognition Arousal: Alert Behavior During Therapy: WFL for tasks assessed/performed   PT - Cognitive impairments: No apparent impairments                         Following commands: Intact      Cueing Cueing Techniques: Verbal cues, Visual cues  Exercises      General Comments General comments (skin integrity, edema, etc.): Obtained vitals with activity; as follows:   01/05/24 1100 01/05/24 1112  Orthostatic Sitting  BP- Sitting 126/77 138/83  Pulse- Sitting 84 90 (post amb 15 ft)  Orthostatic Standing at 0 minutes  BP- Standing at 0 minutes 151/90  --  Pulse- Standing at 0 minutes 92  --   Orthostatic Standing at 3 minutes  BP- Standing at 3 minutes  --  150/82  Pulse- Standing at 3 minutes  --  96 (standing post amb 35 ft)         Pertinent Vitals/Pain Pain Assessment Pain Assessment: Faces Faces Pain Scale: Hurts little more Pain Location: Abdominal pain Pain Descriptors / Indicators: Grimacing, Guarding Pain Intervention(s): Limited activity within patient's tolerance    Home Living                          Prior Function            PT Goals (current goals can now be found in the care plan section)  Acute Rehab PT Goals Patient Stated Goal: to improve mobility; be abel to manage at home PT Goal Formulation: With patient Time For Goal Achievement: 01/12/24 Potential to Achieve Goals: Good Progress towards PT goals: Progressing toward goals    Frequency    Min 2X/week      PT Plan      Co-evaluation              AM-PAC PT 6 Clicks Mobility   Outcome Measure  Help needed turning from your back to your side while in a flat bed without using bedrails?: A Little Help needed moving from lying on your back to sitting on the side of a flat bed without using bedrails?: A Little Help needed moving to and from a bed to a chair (including a wheelchair)?: A Little Help needed standing up from a chair using your arms (e.g., wheelchair or bedside chair)?: A Little Help needed to walk in hospital room?: A Little Help needed climbing 3-5 steps with a railing? : Total 6 Click Score: 16    End of Session   Activity Tolerance: Patient tolerated treatment well Patient left: in chair;with call bell/phone within reach (Lab in the room) Nurse Communication: Mobility status PT Visit Diagnosis: Unsteadiness on feet (R26.81);Muscle weakness (generalized) (M62.81);Pain Pain - part of body:  (Abdominal)     Time: 8965-8898 PT Time Calculation (min) (ACUTE ONLY): 27 min  Charges:    $Gait Training: 23-37 mins PT General Charges $$ ACUTE PT VISIT: 1 Visit                     Silvano Currier, PT  Acute Rehabilitation Services Office [phone] Secure Chat welcomed    Silvano VEAR Currier 01/05/2024, 11:35 AM

## 2024-01-05 NOTE — Assessment & Plan Note (Addendum)
 Recent admission which he was d/c on 01/02/24. S/p L renal artery embolization with IR 11/21. Repeat CTAP on admission and overnight w/ no acute finding.  Had episode of tachycardia and diaphoresis overnight, workup was unremarkable and suspect possibly related to anxiety.  T. bili still mildly elevated but alk phos WNL, so unlikely to be 2/2 acute cholecystitis.  Will continue to consider mesenteric ischemia given postprandial symptoms although LA was normal - RUQ US  yesterday with moderate sludge and mild dilation, otherwise asymptomatic today, will continue to monitor. - Pain control: scheduled Tylenol  1000 mg Q8h, Continue oxycodone  5mg  PO q4h, only needed 1 dose last 24 hours, Continue gabapentin  100 mg MWF after dialysis - Start as needed 25 mg hydroxyzine  p.o. for anxiety - Bowel regimen below - Consider changing CLD to renal diet with fluid restriction - PT/OT Rec pending - AM RFP and Mg

## 2024-01-05 NOTE — Assessment & Plan Note (Addendum)
 Had small bowel movement with enema yesterday, does not like MiraLAX  because he has limited fluid intake and has had emesis after some MiraLAX . - s/p Milk/mollassus enema yesterday with - D/C MiRaLax  17 g BID - Start warm prune juice BID  - Senna 8.6 mg BID

## 2024-01-05 NOTE — Progress Notes (Signed)
 Daily Progress Note Intern Pager: [phone]  Patient name: James Wall [facility] Medical record number: [id number] Date of birth: 1965-02-24 Age: 58 y.o. Gender: male  Primary Care Provider: Janna Ferrier, DO Consultants: Nephrology Code Status: Full code  Pt Overview and Major Events to Date:  11/30-admitted to [facility]MTS  Medical Decision Making: Dinari Stgermaine is a 58 year old male with PMHx HFrEF, ESRD on HD MWF, HTN, COPD, prostate cancer, liver cirrhosis admitted for worsening left-sided abdominal pain previously admitted for left renal artery hemorrhage s/p left renal artery embolization with IR 11/21.  Doing much better this morning, had an acute event last night with tachycardia and diaphoresis following episode of emesis, suspect possibly likely to anxiety though could have early mild mesenteric ischemia, with need to be early because lactic acid is not elevated. Assessment & Plan Abdominal pain Post-operative pain Recent admission which he was d/c on 01/02/24. S/p L renal artery embolization with IR 11/21. Repeat CTAP on admission and overnight w/ no acute finding.  Had episode of tachycardia and diaphoresis overnight, workup was unremarkable and suspect possibly related to anxiety.  T. bili still mildly elevated but alk phos WNL, so unlikely to be 2/2 acute cholecystitis.  Will continue to consider mesenteric ischemia given postprandial symptoms although LA was normal - RUQ US  yesterday with moderate sludge and mild dilation, otherwise asymptomatic today, will continue to monitor. - Pain control: scheduled Tylenol  1000 mg Q8h, Continue oxycodone  5mg  PO q4h, only needed 1 dose last 24 hours, Continue gabapentin  100 mg MWF after dialysis - Start as needed 25 mg hydroxyzine  p.o. for anxiety - Bowel regimen below - Consider changing CLD to renal diet with fluid restriction - PT/OT Rec pending - AM RFP and Mg Constipation Had small bowel movement with enema yesterday, does not  like MiraLAX  because he has limited fluid intake and has had emesis after some MiraLAX . - s/p Milk/mollassus enema yesterday with - D/C MiRaLax  17 g BID - Start warm prune juice BID  - Senna 8.6 mg BID QT prolongation Prolonged qtc on most recent EKG 540, repeat 477, QTc 513 on EKG this morning - Will restart Atarax  as needed and repeat EKG tomorrow morning if needing more doses.  ESRD (end stage renal disease) [facility]) Nephrology following and on hemodialysis schedule MWF, plan for less UF with HD in next session given events overnight - Weight is up to 90.7 kg yesterday afternoon, last weight Sunday was 85.4 kg, that was predialysis weight so we will follow-up today - Nephrology following, received HD yesterday - Potassium 4.8 today, down from 6.0., will continue to monitor daily - AM RFP and Mg  Chronic health problem First degree AV block : Continue Telemetry, outpatient Zio patch  HFpEF: Daily weight (stable), CTM volume status.  Medications as above. GERD: Continue home Protonix  40 mg daily  HLD: Lipitor  Secondary hyperparathyroidism: Continue home Sensipar  90 mg daily  COPD: Continue home Stiolto Respimat , on formulary Brovana  neb BID and Incruse Ellipta  HTN: Continue home Hydral 25 TID per cardiology,  Continue home amlodipine  10 daily, isosorbide  20 BID.  Elevated BPs today, will monitor after dialysis for improvement   FEN/GI: CLD with 1200 mL restriction PPx: Heparin  Dispo:Home with home health pending clinical improvement . Barriers include abdominal pain and poor p.o.   Subjective:  Had an event overnight with vomiting after drinking apple juice, then experienced tachycardia and diaphoresis with increased abdominal pain, concern for worsening intra-abdominal bleeding and or ACS from overnight provider.  Patient's symptoms eventually resolved and fell asleep.  Saw patient this morning and was resting comfortably, in no acute distress reports pain is much better controlled with  current regimen.  Endorses that because of his limited p.o. intake he does not like taking MiraLAX  because he had an episode of emesis.  Reports that he usually has regular bowel movements with drinking warm prune juice and would like to try that again.  He also has not been eating much of anything because it does not taste good.  Objective: Temp:  [97.5 F (36.4 C)-98.5 F (36.9 C)] 97.5 F (36.4 C) (12/02 0528) Pulse Rate:  [61-128] 74 (12/02 0528) Resp:  [12-27] 14 (12/01 1735) BP: (103-153)/(68-90) 134/74 (12/02 0528) SpO2:  [95 %-100 %] 100 % (12/02 0528) Weight:  [90.7 kg] 90.7 kg (12/01 1423) Physical Exam: General: Well-appearing, no acute distress Cardiovascular: Regular rate rhythm, soft S1, holosystolic murmur 3 out of 6 loudest at LUSB Respiratory: Clear to auscultation bilaterally, no wheezes or crackles Abdomen: Severe tenderness to deep palpation LLQ, mild tenderness generally, no rebound or guarding Extremities: Nonedematous, nontender  Laboratory: Most recent CBC Lab Results  Component Value Date   WBC 9.7 01/05/2024   HGB 9.7 (L) 01/05/2024   HCT 30.5 (L) 01/05/2024   MCV 100.3 (H) 01/05/2024   PLT 255 01/05/2024   Most recent BMP    Latest Ref Rng & Units 01/05/2024    4:30 AM  BMP  Glucose 70 - 99 mg/dL 92   BUN 6 - 20 mg/dL 34   Creatinine 9.38 - 1.24 mg/dL 1.80   Sodium 864 - 854 mmol/L 133   Potassium 3.5 - 5.1 mmol/L 4.6   Chloride 98 - 111 mmol/L 94   CO2 22 - 32 mmol/L 25   Calcium  8.9 - 10.3 mg/dL 9.0     Other pertinent labs magnesium  2.1, alk phos 115, T. bili 2.5, troponin 56  Imaging/Diagnostic Tests: CTAP with contrast IMPRESSION: 1. No new abnormality. 2. Unchanged large left perinephric and perirenal hematomas. 3. Trace bilateral pleural effusions.  RUQ ultrasound IMPRESSION: 1. Moderate gallbladder sludge with mild gallbladder dilation. 2. Trace ascites.  EKG unchanged from previous EKGs, sinus rhythm with first-degree AV  block, LAD with wide QRS, possible LVH  Lorrane Pac, MD 01/05/2024, 7:57 AM  PGY-1, [facility] Family Medicine FPTS Intern pager: [phone], text pages welcome Secure chat group [facility] Teaching Service

## 2024-01-05 NOTE — Progress Notes (Signed)
 Pt. Wanted sit on chair and while transferring not feeling okay ,diaphoretic and HR 120 and above. Vitals taken and contacted nephrology Md.Gearline.Bolus 250 ordered and given.

## 2024-01-05 NOTE — Progress Notes (Signed)
 [facility] Progress Note   Subjective:   Completed dialysis yesterday net UF 1.9. Became tachycardic and hypotensive after HD. Had repeat CT w/o acute findings. Hgb stable. Received small IVF bolus  Seen in room resting comfortably. Denies f/c, cp, sob.   Objective Vitals:   01/05/24 0130 01/05/24 0326 01/05/24 0528 01/05/24 0841  BP: 104/74 131/69 134/74 134/66  Pulse: (!) 122 61 74 70  Resp:    18  Temp:  97.9 F (36.6 C) (!) 97.5 F (36.4 C) 98.6 F (37 C)  TempSrc:   Oral   SpO2: 98% 100% 100% 96%  Weight:      Height:       Physical Exam General: alert,nad  Lungs: Clear, normal wob Abdomen: soft, diffuse tenderness Extremities: No sig LE edema  Dialysis Access:  [facility]  Additional Objective Labs: Basic Metabolic Panel: Recent Labs  Lab 01/01/24 1243 01/02/24 0437 01/03/24 1724 01/03/24 1741 01/04/24 0420 01/05/24 0430  NA 135 132* 135 134* 132* 133*  K 5.0 4.2 5.7* 6.0* 4.8 4.6  CL 95* 93* 94* 96* 92* 94*  CO2 26 27 25   --  26 25  GLUCOSE 83 88 95 89 81 92  BUN 45* 27* 46* 60* 50* 34*  CREATININE 11.16* 7.35* 10.09* 10.70* 10.55* 8.19*  CALCIUM  9.2 9.1 10.2  --  9.6 9.0  PHOS 3.6 3.1  --   --  4.4  --    Liver Function Tests: Recent Labs  Lab 01/03/24 1724 01/04/24 0420 01/05/24 0430  AST 58*  --  34  ALT 28  --  20  ALKPHOS 107  --  115  BILITOT 2.6*  --  2.5*  PROT 7.3  --  6.7  ALBUMIN  2.8* 2.5* 2.5*   Recent Labs  Lab 01/03/24 1827  LIPASE 34   CBC: Recent Labs  Lab 01/01/24 1243 01/02/24 0437 01/03/24 1724 01/03/24 1741 01/04/24 0420 01/05/24 0430  WBC 7.7 8.7 8.9  --  9.0 9.7  NEUTROABS  --   --  7.3  --   --  8.3*  HGB 8.8* 8.9* 10.1* 11.6* 9.5* 9.7*  HCT 27.5* 27.3* 32.5* 34.0* 29.4* 30.5*  MCV 98.6 98.6 100.0  --  98.7 100.3*  PLT 196 192 247  --  224 255   Blood Culture    Component Value Date/Time   SDES BLOOD SITE NOT SPECIFIED 10/19/2022 1501   SPECREQUEST  10/19/2022 1501    BOTTLES DRAWN AEROBIC  AND ANAEROBIC Blood Culture results may not be optimal due to an excessive volume of blood received in culture bottles   CULT  10/19/2022 1501    NO GROWTH 5 DAYS Performed at [facility], [address] [place], [place] [address]    REPTSTATUS 10/24/2022 FINAL 10/19/2022 1501    Cardiac Enzymes: No results for input(s): CKTOTAL, CKMB, CKMBINDEX, TROPONINI in the last 168 hours. CBG: No results for input(s): GLUCAP in the last 168 hours. Iron  Studies: No results for input(s): IRON , TIBC, TRANSFERRIN, FERRITIN in the last 72 hours. @lablastinr3 @ Studies/Results: CT ABDOMEN PELVIS W CONTRAST Result Date: 01/05/2024 EXAM: CT ABDOMEN AND PELVIS WITH CONTRAST 01/05/2024 02:43:16 AM TECHNIQUE: CT of the abdomen and pelvis was performed with the administration of 75 mL of iohexol  (OMNIPAQUE ) 350 MG/ML injection. Multiplanar reformatted images are provided for review. Automated exposure control, iterative reconstruction, and/or weight-based adjustment of the mA/kV was utilized to reduce the radiation dose to as low as reasonably achievable. COMPARISON: Comparison with prior  study dated 07/03/2023. CLINICAL HISTORY: hx renal artery embolization, now with worsening abdominal pain, dizziness, low BP, and tachycardia. FINDINGS: LOWER CHEST: Trace bilateral pleural effusions. LIVER: The liver is unremarkable. GALLBLADDER AND BILE DUCTS: Gallbladder is unremarkable. No biliary ductal dilatation. SPLEEN: No acute abnormality. PANCREAS: No acute abnormality. ADRENAL GLANDS: No acute abnormality. KIDNEYS, URETERS AND BLADDER: Right kidney: Atrophic right kidney with multiple cystic lesions, unchanged from prior. Left kidney: Unchanged large left perinephric and perirenal hematomas with hemorrhage tracking inferiorly in the anterior perirenal space. Embolization coils near the left renal hilum. Unchanged tiny locule of gas within the left renal parenchyma. Bladder: Nondistended bladder. GI  AND BOWEL: Stomach demonstrates no acute abnormality. There is no bowel obstruction. PERITONEUM AND RETROPERITONEUM: No ascites. No free air. VASCULATURE: Aorta is normal in caliber. Aortic atherosclerotic calcification. LYMPH NODES: No lymphadenopathy. REPRODUCTIVE ORGANS: Metallic seeds in the prostate. BONES AND SOFT TISSUES: No acute osseous abnormality. No focal soft tissue abnormality. IMPRESSION: 1. No new abnormality. 2. Unchanged large left perinephric and perirenal hematomas. 3. Trace bilateral pleural effusions. Electronically signed by: Norman Gatlin MD 01/05/2024 03:15 AM EST RP Workstation: [id number]   US  Abdomen Limited RUQ (LIVER/GB) Result Date: 01/04/2024 EXAM: Right Upper Quadrant Abdominal Ultrasound 01/04/2024 01:52:52 PM TECHNIQUE: Real-time ultrasonography of the right upper quadrant of the abdomen was performed. COMPARISON: US  Abdomen 10/20/2022. CT abdomen/pelvis 01/03/2024. CLINICAL HISTORY: Right upper quadrant pain 2 weeks. FINDINGS: LIVER: The liver demonstrates normal echogenicity. No intrahepatic biliary ductal dilatation. No evidence of mass. BILIARY SYSTEM: Gallbladder wall thickness measures 0.2 cm, which is within normal limits. Moderate amount of gallbladder sludge. Gallbladder is slightly dilated. No pericholecystic fluid. No cholelithiasis. The common bile duct measures 0.5 cm, which is within normal limits. OTHER: Small amount of ascites. IMPRESSION: 1. Moderate gallbladder sludge with mild gallbladder dilation. 2. Trace ascites. Electronically signed by: Toribio Agreste MD 01/04/2024 05:55 PM EST RP Workstation: [id number]   CT ABDOMEN PELVIS W CONTRAST Result Date: 01/03/2024 CLINICAL DATA:  Left-sided abdominal pain for 3 days. Abdominal distension. Unable to keep food down. History of dialysis. EXAM: CT ABDOMEN AND PELVIS WITH CONTRAST TECHNIQUE: Multidetector CT imaging of the abdomen and pelvis was performed using the standard protocol following bolus administration  of intravenous contrast. RADIATION DOSE REDUCTION: This exam was performed according to the departmental dose-optimization program which includes automated exposure control, adjustment of the mA and/or kV according to patient size and/or use of iterative reconstruction technique. CONTRAST:  75mL OMNIPAQUE  IOHEXOL  350 MG/ML SOLN COMPARISON:  CT, 12/28/2023. FINDINGS: Lower chest: Trace residual left pleural effusion significantly decreased from the prior study. Left lung base atelectasis has notably improved. Residual bilateral lung base opacities consistent with atelectasis. Focal scarring in the right middle lobe. Heart mildly enlarged. Hepatobiliary: Normal liver. Gallbladder is distended, otherwise unremarkable. No bile duct dilation. Pancreas: Unremarkable. No pancreatic ductal dilatation or surrounding inflammatory changes. Spleen: Normal in size without focal abnormality. Adrenals/Urinary Tract: No adrenal mass. Stomach/Bowel: Large left perinephric hematoma with hemorrhage also extending into the pararenal space. This is without significant change from the prior CT. Marked right renal atrophy. Bilateral renal masses consistent with cysts, many obscured on the left due to the hematoma. Metallic foreign body projects along the medial margin of the left kidney suspected to be embolization coils. This is stable. Ureters are normal in course and in caliber. Bladder is decompressed. Vascular/Lymphatic: Aortic atherosclerosis. No aneurysm. No enlarged lymph nodes. Reproductive: Prostate fiducial markers.  Prostate normal in size. Other: Trace ascites predominantly collecting adjacent  to the liver. This has decreased the previous CT. Bilateral flank subcutaneous edema, greater on the left, also unchanged. Musculoskeletal: No fracture or acute finding. Diffuse sclerotic appearance consistent renal osteodystrophy. IMPRESSION: 1. Decrease left pleural effusion and lung base atelectasis since the recent prior CT. 2.  Decreased ascites since the prior CT. 3. No change in the large left perinephric and pararenal hematoma. Stable embolization coils along the left renal artery. 4. Wall thickening noted along the descending colon on the prior study has improved. This was likely reactive to the adjacent hematoma. 5. Aortic atherosclerosis. 6. No new abnormalities. Electronically Signed   By: Alm Parkins M.D.   On: 01/03/2024 18:37   Medications:   acetaminophen   1,000 mg Oral Q8H   amLODipine   10 mg Oral QHS   arformoterol   15 mcg Nebulization BID   atorvastatin   80 mg Oral Daily   Chlorhexidine  Gluconate Cloth  6 each Topical Daily   cinacalcet   120 mg Oral Q M,W,F-1800   gabapentin   100 mg Oral Q M,W,F-HD   heparin   5,000 Units Subcutaneous Q8H   heparin  sodium (porcine)  3,200 Units Intracatheter Once   hydrALAZINE   25 mg Oral TID   isosorbide  dinitrate  20 mg Oral BID   latanoprost   1 drop Both Eyes QHS   pantoprazole   40 mg Oral Daily   polyethylene glycol  17 g Oral BID   senna  1 tablet Oral BID   sodium chloride  flush  10-40 mL Intracatheter Q12H   umeclidinium bromide   1 puff Inhalation Daily    Dialysis Orders: [facility] M[facility] 4h  B400  86.7kg  2K bath  TDC  Heparin  4500 + 2000 midrun Last OP HD 11/19, post wt 90.8kg (+4kg) Mircera 150mcg q2wks - last 10/27 Hectorol  4mcg qHD Sensipar  120mg  qHD Usually doesn't get to dry wt, come off 1-6 kg over  Signs off early at 2.5- 3.5 hrs usually  Assessment/Plan: Abdominal pain s/p L renal artery embolization procedure on 12/25/23. Ongoing pain. Management per primary team.  ESRD: on HD MWF. Continue on schedule. HD Wed.  HTN: BP controlled, continue home meds and UF with HD as tolerated. Symptomatic hypotension after HD Mon. Minimal UF with next HD. Follow.  Volume: Weights variable. No gross volume on exam.  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 ESA restarted 11/22 Secondary hyperparathyroidism - CorrCa  elevated. Phos acceptable. Continue Sensipar .  Sevelamer  causing constipation - hold for now.  Constipation - r/t binders per patient. Improving on bowel regimen here.   Maisie Ronnald Acosta PA-C [facility] 01/05/2024,10:07 AM

## 2024-01-05 NOTE — Assessment & Plan Note (Addendum)
 Prolonged qtc on most recent EKG 540, repeat 477, QTc 513 on EKG this morning - Will restart Atarax  as needed and repeat EKG tomorrow morning if needing more doses.

## 2024-01-05 NOTE — Care Management Obs Status (Signed)
 MEDICARE OBSERVATION STATUS NOTIFICATION   Patient Details  Name: Geordie Nooney MRN: [id number] Date of Birth: [birth year]   Medicare Observation Status Notification Given:  No  Patient refused to sign.   Dayn Barich 01/05/2024, 9:23 AM

## 2024-01-05 NOTE — Progress Notes (Signed)
 Family medicine Md.Mabe, Contacted and come to see pt.

## 2024-01-05 NOTE — Plan of Care (Addendum)
 Paged by RN for patient becoming more tachycardic, dizzy, and hypotensive. Assessed patient at bedside. He was diaphoretic and felt unlike he ever has before. He was having an out of body experience. He declines any true pain at rest. RN also paged nephrology who ordered 250 mL bolus.  HR 123 on my exam without m/r/g, BP 100s/70s, O2 97% with normal breath sounds. Abd pain diffusely, mildly distended. He was speaking in full sentences though appeared uncomfortable. He was able to participate in our exam.  Given his recent L renal artery embolization on 11/21 due to renal artery and retroperitoneal hemorrhage and concurrent dizziness, tachycardia, and decreasing BP from earlier today, I have ordered another CTAP with contrast to evaluate for increased bleeding. Can also further evaluate for gallbladder pathology given mildly dilated gallbladder with moderate sludge on RUQ US  today. He has consented to this. I have also ordered a STAT EKG and troponins, though with the lack of chest pain, I feel this is less likely.   ADDENDUM 0413: reassessed patient. He is sleeping soundly after the CT scan, and vitals are improved. CT without acute change. EKG without significant change on my read. RN to touch base with lab about processing troponin. Appreciate her help with this patient.

## 2024-01-06 ENCOUNTER — Other Ambulatory Visit (HOSPITAL_COMMUNITY): Payer: Self-pay

## 2024-01-06 DIAGNOSIS — G8918 Other acute postprocedural pain: Secondary | ICD-10-CM | POA: Diagnosis not present

## 2024-01-06 LAB — RENAL FUNCTION PANEL
Albumin: 2.4 g/dL — ABNORMAL LOW (ref 3.5–5.0)
Anion gap: 13 (ref 5–15)
BUN: 44 mg/dL — ABNORMAL HIGH (ref 6–20)
CO2: 26 mmol/L (ref 22–32)
Calcium: 8.5 mg/dL — ABNORMAL LOW (ref 8.9–10.3)
Chloride: 92 mmol/L — ABNORMAL LOW (ref 98–111)
Creatinine, Ser: 9.58 mg/dL — ABNORMAL HIGH (ref 0.61–1.24)
GFR, Estimated: 6 mL/min — ABNORMAL LOW (ref >60)
Glucose, Bld: 114 mg/dL — ABNORMAL HIGH (ref 70–99)
Phosphorus: 4 mg/dL (ref 2.5–4.6)
Potassium: 4.3 mmol/L (ref 3.5–5.1)
Sodium: 131 mmol/L — ABNORMAL LOW (ref 135–145)

## 2024-01-06 LAB — MAGNESIUM: Magnesium: 1.9 mg/dL (ref 1.7–2.4)

## 2024-01-06 MED ORDER — ALTEPLASE 2 MG IJ SOLR: 2.0000 mg | Freq: Once | INTRAMUSCULAR | Status: DC | PRN

## 2024-01-06 MED ORDER — ANTICOAGULANT SODIUM CITRATE 4% (200MG/5ML) IV SOLN: 5.0000 mL | Status: DC | PRN

## 2024-01-06 MED ORDER — PENTAFLUOROPROP-TETRAFLUOROETH EX AERO: 1.0000 | INHALATION_SPRAY | CUTANEOUS | Status: DC | PRN

## 2024-01-06 MED ORDER — HYDROXYZINE HCL 25 MG PO TABS
25.0000 mg | ORAL_TABLET | Freq: Two times a day (BID) | ORAL | 0 refills | Status: AC | PRN
  Filled 2024-01-06: qty 60, 30d supply, fill #0

## 2024-01-06 MED ORDER — OXYCODONE HCL 5 MG PO TABS
5.0000 mg | ORAL_TABLET | Freq: Three times a day (TID) | ORAL | 0 refills | Status: AC | PRN
  Filled 2024-01-06: qty 5, 2d supply, fill #0

## 2024-01-06 MED ORDER — HEPARIN SODIUM (PORCINE) 1000 UNIT/ML IJ SOLN
INTRAMUSCULAR | Status: AC
  Filled 2024-01-06: qty 4

## 2024-01-06 MED ORDER — HEPARIN SODIUM (PORCINE) 1000 UNIT/ML DIALYSIS: 1000.0000 [IU] | INTRAMUSCULAR | Status: DC | PRN

## 2024-01-06 MED ORDER — PANTOPRAZOLE SODIUM 40 MG PO TBEC
40.0000 mg | DELAYED_RELEASE_TABLET | Freq: Every day | ORAL | 0 refills | Status: AC
  Filled 2024-01-06: qty 30, 30d supply, fill #0

## 2024-01-06 MED ORDER — LIDOCAINE-PRILOCAINE 2.5-2.5 % EX CREA: 1.0000 | TOPICAL_CREAM | CUTANEOUS | Status: DC | PRN

## 2024-01-06 MED ORDER — GABAPENTIN 100 MG PO CAPS
100.0000 mg | ORAL_CAPSULE | ORAL | 0 refills | Status: AC
  Filled 2024-01-06: qty 12, 28d supply, fill #0

## 2024-01-06 MED ORDER — LIDOCAINE HCL (PF) 1 % IJ SOLN: 5.0000 mL | INTRAMUSCULAR | Status: DC | PRN

## 2024-01-06 MED ORDER — ACETAMINOPHEN 500 MG PO TABS
1000.0000 mg | ORAL_TABLET | Freq: Three times a day (TID) | ORAL | 0 refills | Status: AC
  Filled 2024-01-06: qty 30, 5d supply, fill #0

## 2024-01-06 NOTE — Progress Notes (Signed)
 Pt. HR dropped to 30s' while sleeping not sustained and sinus pouse 3.13 sec.Md.Mabe notified.

## 2024-01-06 NOTE — Assessment & Plan Note (Deleted)
 Nephrology following and on hemodialysis schedule MWF, plan for less UF with HD in next session given events overnight - Weight is up to 90.7 kg yesterday afternoon, last weight Sunday was 85.4 kg, that was predialysis weight so we will follow-up today - Nephrology following, received HD yesterday - Potassium 4.8 today, down from 6.0., will continue to monitor daily - AM RFP and Mg

## 2024-01-06 NOTE — Progress Notes (Signed)
 D/C order noted. Contacted [facility] to be advised of pt's d/c today and that pt should resume care on Friday.   Randine Mungo Dialysis Navigator [phone]

## 2024-01-06 NOTE — Assessment & Plan Note (Deleted)
*** -   s/p Milk/mollassus enema yesterday with - D/C MiRaLax  17 g BID - Start warm prune juice BID  - Senna 8.6 mg BID

## 2024-01-06 NOTE — Discharge Summary (Addendum)
 Family Medicine Teaching [facility] Discharge Summary  Patient name: James Wall Medical record number: [id number] Date of birth: April 29, 1965 Age: 58 y.o. Gender: male Date of Admission: 01/03/2024  Date of Discharge: 01/06/2024 Admitting Physician: [place] KATHEE Samuels, DO  Primary Care Provider: Janna Ferrier, DO Consultants: [place],   Indication for Hospitalization: Worsening left-sided abdominal pain and poor p.o.  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 His hospital course is detailed below:  Abdominal pain With history of constipation since last admission. Per patient he has not been able to have a bowel movement in the last 3 days. LLQ w/ pain on light palpation. Patient states this pain similar to constipation pain that he had earlier. No new abnormal finding on CTAP on admission but show stool burden through out colon. Starting bowel regimen on admission and eventually had bowel movement after 2 enemas.  Pain was much improved after resuming diet, and patient was feeling ready for discharge.  ESRD Nephrology was consulted and dialysis was continued while patient was admitted.   3.  Sinus bradycardia with first-degree AV block Evaluated in previous hospitalization by cardiology,.  Of sinus bradycardia to 30s with sinus pause, already planned for outpatient cardiology follow-up and Zio patch.  Other chronic conditions were medically managed with home medications  and formulary alternatives as necessary   PCP Follow-up Recommendations: Please make sure he comply with a regular bowel regimen  Ensure cardiology follow up and Zio patch palcement.  Follow up with urology.   Follow up with nephrology.  Encourage outpatient sleep study  Monitor for bleeding signs    Results/Tests Pending at Time of Discharge:  Unresulted Labs (From admission, onward)     Start     Ordered   01/06/24 0500  Renal function panel  Every Mon-Wed-Fri (0500),   R      01/05/24 1118   01/06/24 0500  Magnesium   Every Mon-Wed-Fri (0500),   R      01/05/24 1118             Disposition: Home with home health  Discharge Condition: Stable  Discharge Exam:  Vitals:   01/06/24 1142 01/06/24 1148  BP: (!) 150/77 135/80  Pulse: 77 76  Resp: 10 20  Temp: 97.8 F (36.6 C)   SpO2: 99% 96%   General: Well-appearing, no acute distress Cardiac: Regular rate and rhythm. Normal S1/S2. No murmurs, rubs, or gallops appreciated. Lungs: Clear bilaterally to ascultation.  Abdomen: Normoactive bowel sounds.  Mild tenderness to deep palpation LUQ. No rebound or guarding.   Psych: Pleasant and appropriate   Significant Procedures: None  Significant Labs and Imaging:  Recent Labs  Lab 01/05/24 0430  WBC 9.7  HGB 9.7*  HCT 30.5*  PLT 255   Recent Labs  Lab 01/05/24 0430 01/06/24 0557  NA 133* 131*  K 4.6 4.3  CL 94* 92*  CO2 25 26  GLUCOSE 92 114*  BUN 34* 44*  CREATININE 8.19* 9.58*  CALCIUM  9.0  8.5*  MG 2.1 1.9  PHOS  --  4.0  ALKPHOS 115  --   AST 34  --   ALT 20  --   ALBUMIN  2.5* 2.4*   CTAP with contrast 11/30 IMPRESSION: 1. Decrease left pleural effusion and lung base atelectasis since the recent prior CT. 2. Decreased ascites since the prior CT. 3. No change in the large left perinephric and pararenal hematoma. Stable embolization coils along the left renal artery. 4. Wall thickening noted along the descending colon on the prior study has  improved. This was likely reactive to the adjacent hematoma. 5. Aortic atherosclerosis. 6. No new abnormalities   Discharge Medications:  Allergies as of 01/06/2024       Reactions   Venofer   [ferric Oxide] Itching   Aspirin  Other (See Comments)   Avoids due to renal disease;  Made congestion worse        Medication List     STOP taking these medications    HYDROcodone -acetaminophen  5-325 MG tablet Commonly known as: NORCO/VICODIN   sevelamer  carbonate 800 MG tablet Commonly known as: RENVELA        TAKE these medications    acetaminophen  500 MG tablet Commonly known as: TYLENOL  Take 2 tablets (1,000 mg total) by mouth every 8 (eight) hours.   albuterol  108 (90 Base) MCG/ACT inhaler Commonly known as: VENTOLIN  HFA Inhale 2 puffs into the lungs every 6 (six) hours as needed for wheezing or shortness of breath.   amLODipine  10 MG tablet Commonly known as: NORVASC  Take 1 tablet (10 mg total) by mouth at bedtime.   atorvastatin  80 MG tablet Commonly known as: LIPITOR Take 1 tablet (80 mg total) by mouth daily.   Auryxia  1 GM 210 MG(Fe) tablet Generic drug: ferric citrate  Take 630 mg by mouth 3 (three) times daily with meals. And 210mg  (1 tablet) up to three times a day with snacks.   cinacalcet  30 MG tablet Commonly known as: SENSIPAR  Take 4 tablets (120 mg total) by mouth every dialysis (give after dialysis with meal).   gabapentin  100 MG capsule Commonly known as: NEURONTIN  Take 1 capsule (100 mg total) by mouth every Monday, Wednesday, and Friday with hemodialysis.   HECTOROL  IV Inject 3 mcg into the vein every Monday, Wednesday, and Friday with hemodialysis. Provided and administered by dialysis clinic on Monday, Wednesday, Friday.   hydrALAZINE  25 MG tablet Commonly known as: APRESOLINE  Take 1 tablet (25 mg total) by mouth 3 (three) times daily.   hydrOXYzine  25 MG tablet Commonly known as: ATARAX  Take 1 tablet (25 mg total) by mouth 2 (two) times  daily as needed for anxiety. What changed:  when to take this reasons to take this   isosorbide  dinitrate 20 MG tablet Commonly known as: ISORDIL  Take 1 tablet (20 mg total) by mouth 2 (two) times daily.   latanoprost  0.005 % ophthalmic solution Commonly known as: XALATAN  Place 1 drop into both eyes at bedtime.   MIRCERA IJ Inject 150 mcg into the vein every 14 (fourteen) days. Provided and administered by dialysis clinic every 14 days.   ondansetron  4 MG tablet Commonly known as: ZOFRAN  Take 1 tablet (4 mg total) by mouth every 8 (eight) hours as needed for nausea or vomiting.   oxyCODONE  5 MG immediate release tablet Commonly known as: Oxy IR/ROXICODONE  Take 1 tablet (5 mg total) by mouth every 8 (eight) hours as needed (pain).   pantoprazole  40 MG tablet Commonly known as: PROTONIX  Take 1 tablet (40 mg total)  by mouth daily. Start taking on: January 07, 2024 What changed:  medication strength See the new instructions.   polyethylene glycol 17 g packet Commonly known as: MIRALAX  / GLYCOLAX  Take 17 g by mouth daily. What changed: Another medication with the same name was removed. Continue taking this medication, and follow the directions you see here.   senna 8.6 MG Tabs tablet Commonly known as: SENOKOT Take 2 tablets (17.2 mg total) by mouth daily. What changed: Another medication with the same name was removed. Continue taking this medication, and follow the directions you see here.   simethicone  40 MG/0.6ML drops Commonly known as: MYLICON Take 0.6 mLs (40 mg total) by mouth 4 (four) times daily as needed for flatulence.   Stiolto Respimat  2.5-2.5 MCG/ACT Aers Generic drug: Tiotropium Bromide-Olodaterol Inhale 2 puffs into the lungs daily.               Durable Medical Equipment  (From admission, onward)           Start     Ordered   01/04/24 1816  For home use only DME standard manual wheelchair with seat cushion  Once       Comments: Patient  suffers from generalized weakness which impairs their ability to perform daily activities like dressing in the home.  A walker will not resolve issue with performing activities of daily living. A wheelchair will allow patient to safely perform daily activities. Patient can safely propel the wheelchair in the home or has a caregiver who can provide assistance. Length of need 6 months . Accessories: elevating leg rests (ELRs), wheel locks, extensions and anti-tippers.   01/04/24 1816            Discharge Instructions: Please refer to Patient Instructions section of EMR for full details.  Patient was counseled important signs and symptoms that should prompt return to medical care, changes in medications, dietary instructions, activity restrictions, and follow up appointments.   Follow-Up Appointments:  Contact information for after-discharge care     Home Medical Care     [facility]) .   Service: Home Health Services Contact information: [address] [place] [address] [place]  [address] [phone]                     Lorrane Pac, MD 01/06/2024, 12:37 PM PGY-1, [facility]

## 2024-01-06 NOTE — Assessment & Plan Note (Deleted)
 Prolonged qtc on most recent EKG 540, repeat 477, QTc 513 on EKG this morning, troponins trended down yesterday 69-63, likely persistently elevated due to ESRD. - Will restart Atarax  as needed and repeat EKG tomorrow morning if needing more doses.

## 2024-01-06 NOTE — Plan of Care (Signed)
 [facility] Patient Discharge Orders- [facility] CLINIC: [place]   Patient's name: Chadric Kimberley [facility] Admit/DC Dates: 01/03/2024 - 01/06/2024  Discharge Diagnoses: Abdominal pain/constipation.  H/o retroperitoneal bleed s/p renal artery embolization  AV block 1st degree. Outpatient f/u with cardiology   Outpatient Dialysis Orders:  -Heparin : No change -EDW No change  -Bath: No change  Anemia Aranesp : Given: Y   Date of last dose/amount:  11/22, 150 mcg IV  PRBC's Given: Y Date/# of units: 11/21, 3 U  ESA dose for discharge: Mircera 150 mcg IV q 2 weeks   Recent Labs  Lab 01/05/24 0430 01/06/24 0557  HGB 9.7*  --   K 4.6 4.3  CALCIUM  9.0 8.5*  PHOS  --  4.0  ALBUMIN  2.5* 2.4*    Access intervention/Change: none   Medications: -IV Antibiotics:  -- -Anticoagulation:--  OTHER/APPTS/LABS    Completed by: Maisie Ronnald Acosta PA-C   D/C Meds to be reconciled by nurse after every discharge.    Reviewed by: MD:______ RN_______

## 2024-01-06 NOTE — Progress Notes (Signed)
    Durable Medical Equipment  (From admission, onward)           Start     Ordered   01/04/24 1816  For home use only DME standard manual wheelchair with seat cushion  Once       Comments: Patient suffers from generalized weakness which impairs their ability to perform daily activities like dressing in the home.  A walker will not resolve issue with performing activities of daily living. A wheelchair will allow patient to safely perform daily activities. Patient can safely propel the wheelchair in the home or has a caregiver who can provide assistance. Length of need 6 months . Accessories: elevating leg rests (ELRs), wheel locks, extensions and anti-tippers.   01/04/24 1816

## 2024-01-06 NOTE — TOC Transition Note (Addendum)
 Transition of Care [facility]) - Discharge Note   Patient Details  Name: James Wall MRN: [id number] Date of Birth: 1965-11-15  Transition of Care [facility] CM/SW Contact:  Tom-Johnson, Harvest Muskrat, RN Phone Number: 01/06/2024, 12:47 PM   Clinical Narrative:     Patient is scheduled for discharge today.  Readmission Risk Assessment done. Home health info, hospital f/u and discharge instructions on AVS. Prescriptions sent to [facility] pharmacy and patient will receive meds prior discharge. Wheelchair ordered per patient's request, patient received a rollator last admit on 08/22/23 and might not be eligible for another mobility equipment. CM notified patient. Order referred to [employer] will deliver to patient if eligible. Family to transport at discharge.  No further ICM needs noted.      Final next level of care: Home w Home Health Services Barriers to Discharge: Barriers Resolved   Patient Goals and CMS Choice Patient states their goals for this hospitalization and ongoing recovery are:: To return home CMS Medicare.gov Compare Post Acute Care list provided to:: Patient Choice offered to / list presented to : Patient      Discharge Placement                Patient to be transferred to facility by: Family      Discharge Plan and Services Additional resources added to the After Visit Summary for   In-house Referral: Clinical Social Work              DME Arranged: Government social research officer DME Agency: [employer] Date DME Agency Contacted: 01/06/24 Time DME Agency Contacted: 1230 Representative spoke with at DME Agency: [facility] HH Arranged: PT, OT, RN, Nurse's Aide HH Agency: [facility] Date HH Agency Contacted: 01/04/24 Time HH Agency Contacted: 1650 Representative spoke with at [facility] Agency: [facility]  Social Drivers of Health (SDOH) Interventions SDOH Screenings   Food Insecurity: No Food Insecurity (01/04/2024)  Housing: Low Risk  (01/04/2024)   Transportation Needs: No Transportation Needs (01/04/2024)  Utilities: Not At Risk (01/04/2024)  Alcohol Screen: Low Risk  (08/27/2023)  Depression (PHQ2-9): Medium Risk (12/24/2023)  Financial Resource Strain: Low Risk  (08/27/2023)  Physical Activity: Inactive (08/27/2023)  Social Connections: Moderately Isolated (01/04/2024)  Stress: No Stress Concern Present (08/05/2022)  Tobacco Use: Medium Risk (01/04/2024)  Health Literacy: Adequate Health Literacy (08/27/2023)     Readmission Risk Interventions    01/06/2024   12:41 PM  Readmission Risk Prevention Plan  Transportation Screening Complete  Medication Review (RN Care Manager) Referral to Pharmacy  PCP or Specialist appointment within 3-5 days of discharge Complete  HRI or Home Care Consult Complete  SW Recovery Care/Counseling Consult Complete  Palliative Care Screening Not Applicable  Skilled Nursing Facility Not Applicable

## 2024-01-06 NOTE — Assessment & Plan Note (Deleted)
 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  *** - RUQ US  yesterday with moderate sludge and mild dilation, otherwise asymptomatic today, will continue to monitor. - Pain control: scheduled Tylenol  1000 mg Q8h, Continue oxycodone  5mg  PO q4h, only needed 1 dose last 24 hours, Continue gabapentin  100 mg MWF after dialysis - Start as needed 25 mg hydroxyzine  p.o. for anxiety - Bowel regimen below - Consider changing CLD to renal diet with fluid restriction - PT/OT following, recommend RW  - AM CBC

## 2024-01-06 NOTE — Progress Notes (Signed)
 Received patient in bed to unit.  Alert and oriented.  Informed consent signed and in chart.   TX duration:3.5 hours  Patient tolerated well.  Transported back to the room  Alert, without acute distress.  Hand-off given to patient's nurse.   Access used: R internal jugular HD cath Access issues: Patient was talking on the phone with right hand and bending the catheter setting machine off  Total UF removed: 2L   01/06/24 1502  Vitals  Temp 97.8 F (36.6 C)  BP 131/81  Pulse Rate 80  Resp 12  Weight 85.6 kg  Type of Weight Post-Dialysis  Oxygen Therapy  SpO2 100 %  O2 Device Nasal Cannula  O2 Flow Rate (L/min) 2 L/min  Patient Activity (if Appropriate) In bed  Pulse Oximetry Type Continuous  Oximetry Probe Site Changed No  During Treatment Monitoring  Duration of HD Treatment -hour(s) 3 hour(s)  HD Safety Checks Performed Yes  Intra-Hemodialysis Comments Tolerated well;Tx completed  Post Treatment  Dialyzer Clearance Lightly streaked  Liters Processed 69.8  Fluid Removed (mL) 2000 mL  Tolerated HD Treatment Yes  Hemodialysis Catheter Right Internal jugular Double lumen Permanent (Tunneled)  Placement Date/Time: 09/24/21 1457   Placed prior to admission: No  Time Out: Correct patient;Correct site;Correct procedure  Maximum sterile barrier precautions: Hand hygiene;Sterile gloves;Cap;Large sterile sheet;Mask;Sterile gown  Site Prep: Chlorh...  Site Condition No complications  Blue Lumen Status Flushed;Antimicrobial dead end cap;Heparin  locked  Red Lumen Status Flushed;Dead end cap in place;Antimicrobial dead end cap  Purple Lumen Status N/A  Catheter fill solution Heparin  1000 units/ml  Catheter fill volume (Arterial) 1.6 cc  Catheter fill volume (Venous) 1.6  Dressing Type Transparent  Dressing Status Antimicrobial disc/dressing in place;Clean, Dry, Intact  Drainage Description None  Dressing Change Due 01/11/24     Camellia Brasil LPN [facility] D[facility]

## 2024-01-06 NOTE — Plan of Care (Signed)
  Problem: Activity: Goal: Risk for activity intolerance will decrease 01/06/2024 0649 by Rosaura Marelyn POUR, RN Outcome: Progressing 01/06/2024 0648 by Rosaura Marelyn POUR, RN Outcome: Progressing   Problem: Nutrition: Goal: Adequate nutrition will be maintained 01/06/2024 0649 by Rosaura Marelyn POUR, RN Outcome: Progressing 01/06/2024 0648 by Rosaura Marelyn POUR, RN Outcome: Progressing

## 2024-01-06 NOTE — Assessment & Plan Note (Deleted)
 First degree AV block : Continue Telemetry, outpatient Zio patch  HFpEF: Daily weight (stable), CTM volume status.  Medications as above. GERD: Continue home Protonix  40 mg daily  HLD: Lipitor  Secondary hyperparathyroidism: Continue home Sensipar  90 mg daily  COPD: Continue home Stiolto Respimat , on formulary Brovana  neb BID and Incruse Ellipta  HTN: Continue home Hydral 25 TID per cardiology,  Continue home amlodipine  10 daily, isosorbide  20 BID.  Elevated BPs today, will monitor after dialysis for improvement

## 2024-01-06 NOTE — Progress Notes (Signed)
 [facility] Progress Note   Subjective:   Seen in room. No acute events overnight.  Feeling better, abdominal pain mostly resolved. Denies cp, sob.  Dialysis today. He's hopeful to go home afterwards    Objective Vitals:   01/05/24 1703 01/05/24 1937 01/06/24 0522 01/06/24 0831  BP: 122/75 122/71 (!) 152/86 (!) 142/81  Pulse: 76 81 72 81  Resp: 18   19  Temp: 98.1 F (36.7 C) 98 F (36.7 C) 97.7 F (36.5 C) 98.1 F (36.7 C)  TempSrc: Oral Oral Oral   SpO2: 95% 98% 95% 100%  Weight:      Height:       Physical Exam General: alert,nad  Lungs: Clear, normal wob Abdomen: soft, diffuse tenderness Extremities: No sig LE edema  Dialysis Access:  [facility]  Additional Objective Labs: Basic Metabolic Panel: Recent Labs  Lab 01/02/24 0437 01/03/24 1724 01/04/24 0420 01/05/24 0430 01/06/24 0557  NA 132*   < > 132* 133* 131*  K 4.2   < > 4.8 4.6 4.3  CL 93*   < > 92* 94* 92*  CO2 27   < > 26 25 26   GLUCOSE 88   < > 81 92 114*  BUN 27*   < > 50* 34* 44*  CREATININE 7.35*   < > 10.55* 8.19* 9.58*  CALCIUM  9.1   < > 9.6 9.0 8.5*  PHOS 3.1  --  4.4  --  4.0   < > = values in this interval not displayed.   Liver Function Tests: Recent Labs  Lab 01/03/24 1724 01/04/24 0420 01/05/24 0430 01/06/24 0557  AST 58*  --  34  --   ALT 28  --  20  --   ALKPHOS 107  --  115  --   BILITOT 2.6*  --  2.5*  --   PROT 7.3  --  6.7  --   ALBUMIN  2.8* 2.5* 2.5* 2.4*   Recent Labs  Lab 01/03/24 1827  LIPASE 34   CBC: Recent Labs  Lab 01/01/24 1243 01/02/24 0437 01/03/24 1724 01/03/24 1741 01/04/24 0420 01/05/24 0430  WBC 7.7 8.7 8.9  --  9.0 9.7  NEUTROABS  --   --  7.3  --   --  8.3*  HGB 8.8* 8.9* 10.1* 11.6* 9.5* 9.7*  HCT 27.5* 27.3* 32.5* 34.0* 29.4* 30.5*  MCV 98.6 98.6 100.0  --  98.7 100.3*  PLT 196 192 247  --  224 255   Blood Culture    Component Value Date/Time   SDES BLOOD SITE NOT SPECIFIED 10/19/2022 1501   SPECREQUEST  10/19/2022 1501     BOTTLES DRAWN AEROBIC AND ANAEROBIC Blood Culture results may not be optimal due to an excessive volume of blood received in culture bottles   CULT  10/19/2022 1501    NO GROWTH 5 DAYS Performed at [facility], [address] [place], [place] [address]    REPTSTATUS 10/24/2022 FINAL 10/19/2022 1501    Cardiac Enzymes: No results for input(s): CKTOTAL, CKMB, CKMBINDEX, TROPONINI in the last 168 hours. CBG: No results for input(s): GLUCAP in the last 168 hours. Iron  Studies: No results for input(s): IRON , TIBC, TRANSFERRIN, FERRITIN in the last 72 hours. @lablastinr3 @ Studies/Results: CT ABDOMEN PELVIS W CONTRAST Result Date: 01/05/2024 EXAM: CT ABDOMEN AND PELVIS WITH CONTRAST 01/05/2024 02:43:16 AM TECHNIQUE: CT of the abdomen and pelvis was performed with the administration of 75 mL of iohexol  (OMNIPAQUE ) 350 MG/ML injection. Multiplanar reformatted images  are provided for review. Automated exposure control, iterative reconstruction, and/or weight-based adjustment of the mA/kV was utilized to reduce the radiation dose to as low as reasonably achievable. COMPARISON: Comparison with prior study dated 07/03/2023. CLINICAL HISTORY: hx renal artery embolization, now with worsening abdominal pain, dizziness, low BP, and tachycardia. FINDINGS: LOWER CHEST: Trace bilateral pleural effusions. LIVER: The liver is unremarkable. GALLBLADDER AND BILE DUCTS: Gallbladder is unremarkable. No biliary ductal dilatation. SPLEEN: No acute abnormality. PANCREAS: No acute abnormality. ADRENAL GLANDS: No acute abnormality. KIDNEYS, URETERS AND BLADDER: Right kidney: Atrophic right kidney with multiple cystic lesions, unchanged from prior. Left kidney: Unchanged large left perinephric and perirenal hematomas with hemorrhage tracking inferiorly in the anterior perirenal space. Embolization coils near the left renal hilum. Unchanged tiny locule of gas within the left renal parenchyma. Bladder:  Nondistended bladder. GI AND BOWEL: Stomach demonstrates no acute abnormality. There is no bowel obstruction. PERITONEUM AND RETROPERITONEUM: No ascites. No free air. VASCULATURE: Aorta is normal in caliber. Aortic atherosclerotic calcification. LYMPH NODES: No lymphadenopathy. REPRODUCTIVE ORGANS: Metallic seeds in the prostate. BONES AND SOFT TISSUES: No acute osseous abnormality. No focal soft tissue abnormality. IMPRESSION: 1. No new abnormality. 2. Unchanged large left perinephric and perirenal hematomas. 3. Trace bilateral pleural effusions. Electronically signed by: Norman Gatlin MD 01/05/2024 03:15 AM EST RP Workstation: [id number]   US  Abdomen Limited RUQ (LIVER/GB) Result Date: 01/04/2024 EXAM: Right Upper Quadrant Abdominal Ultrasound 01/04/2024 01:52:52 PM TECHNIQUE: Real-time ultrasonography of the right upper quadrant of the abdomen was performed. COMPARISON: US  Abdomen 10/20/2022. CT abdomen/pelvis 01/03/2024. CLINICAL HISTORY: Right upper quadrant pain 2 weeks. FINDINGS: LIVER: The liver demonstrates normal echogenicity. No intrahepatic biliary ductal dilatation. No evidence of mass. BILIARY SYSTEM: Gallbladder wall thickness measures 0.2 cm, which is within normal limits. Moderate amount of gallbladder sludge. Gallbladder is slightly dilated. No pericholecystic fluid. No cholelithiasis. The common bile duct measures 0.5 cm, which is within normal limits. OTHER: Small amount of ascites. IMPRESSION: 1. Moderate gallbladder sludge with mild gallbladder dilation. 2. Trace ascites. Electronically signed by: Toribio Agreste MD 01/04/2024 05:55 PM EST RP Workstation: [id number]   Medications:   acetaminophen   1,000 mg Oral Q8H   amLODipine   10 mg Oral QHS   arformoterol   15 mcg Nebulization BID   atorvastatin   80 mg Oral Daily   Chlorhexidine  Gluconate Cloth  6 each Topical Daily   cinacalcet   120 mg Oral Q M,W,F-1800   gabapentin   100 mg Oral Q M,W,F-HD   heparin   5,000 Units Subcutaneous Q8H    heparin  sodium (porcine)  3,200 Units Intracatheter Once   hydrALAZINE   25 mg Oral TID   isosorbide  dinitrate  20 mg Oral BID   latanoprost   1 drop Both Eyes QHS   pantoprazole   40 mg Oral Daily   polyethylene glycol  17 g Oral BID   senna  1 tablet Oral BID   sodium chloride  flush  10-40 mL Intracatheter Q12H   umeclidinium bromide   1 puff Inhalation Daily    Dialysis Orders: [facility] M[facility] 4h  B400  86.7kg  2K bath  TDC  Heparin  4500 + 2000 midrun Last OP HD 11/19, post wt 90.8kg (+4kg) Mircera 150mcg q2wks - last 10/27 Hectorol  4mcg qHD Sensipar  120mg  qHD Usually doesn't get to dry wt, come off 1-6 kg over  Signs off early at 2.5- 3.5 hrs usually  Assessment/Plan: Abdominal pain s/p L renal artery embolization procedure on 12/25/23. Ongoing pain. Management per primary team.  ESRD: on HD MWF. Continue on schedule.  HD Wed. Had out of body experience after HD Mon. Follow  HTN: BP controlled, continue home meds and UF with HD as tolerated.   Volume: Weights variable. No gross volume on exam.  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 ESA restarted 11/22 Secondary hyperparathyroidism - CorrCa  elevated. Phos acceptable. Continue Sensipar . Sevelamer  causing constipation - hold for now.  Constipation - r/t binders per patient. Improving on bowel regimen here.   Maisie Ronnald Acosta PA-C [facility] 01/06/2024,9:25 AM

## 2024-01-07 ENCOUNTER — Telehealth: Payer: Self-pay

## 2024-01-07 ENCOUNTER — Ambulatory Visit: Admitting: Internal Medicine

## 2024-01-07 ENCOUNTER — Ambulatory Visit: Attending: Internal Medicine

## 2024-01-07 DIAGNOSIS — I4719 Other supraventricular tachycardia: Secondary | ICD-10-CM

## 2024-01-07 NOTE — Progress Notes (Deleted)
 Cardiology Office Note:  .    Date:  01/07/2024  ID:  James Wall, DOB 1965-03-14, MRN [id number] PCP: Janna Ferrier, DO  [facility] Providers Cardiologist:  Stanly DELENA Leavens, MD { Click to update primary MD,subspecialty MD or APP then REFRESH:1}    CC: *** Consulted for the evaluation of *** at the behest of ***   History of Present Illness: .    James Wall is a 58 y.o. male ***  Discussed the use of AI scribe software for clinical note transcription with the patient, who gave verbal consent to proceed.   Relevant histories: .  Social *** ROS: As per HPI.   Studies Reviewed: .     Cardiac Studies & Procedures   ______________________________________________________________________________________________     ECHOCARDIOGRAM  ECHOCARDIOGRAM COMPLETE 12/29/2023  Narrative ECHOCARDIOGRAM REPORT    Patient Name:   James Wall [facility] Date of Exam: 12/29/2023 Medical Rec #:  [id number]           Height:       65.0 in Accession #:    [id number]          Weight:       200.6 lb Date of Birth:  1965-11-08           BSA:          1.981 m Patient Age:    58 years            BP:           114/86 mmHg Patient Gender: M                   HR:           63 bpm. Exam Location:  Inpatient  Procedure: 2D Echo, Cardiac Doppler and Color Doppler (Both Spectral and Color Flow Doppler were utilized during procedure).  Indications:    Heart block, Complete  History:        Patient has prior history of Echocardiogram examinations, most recent 08/21/2023. CHF, Signs/Symptoms:Dyspnea; Risk Factors:Hypertension and Sleep Apnea.  Sonographer:    Sherlean Dubin Referring Phys: [id number] ZANE ADAMS   Sonographer Comments: Image acquisition challenging due to respiratory motion. IMPRESSIONS   1. Left ventricular ejection fraction, by estimation, is 55 to 60%. The left ventricle has normal function. The left ventricle has no regional wall motion  abnormalities. There is mild concentric left ventricular hypertrophy. Left ventricular diastolic parameters are indeterminate. 2. Right ventricular systolic function is mildly reduced. The right ventricular size is mildly enlarged. Tricuspid regurgitation signal is inadequate for assessing PA pressure. The estimated right ventricular systolic pressure is 25.3 mmHg. 3. Left atrial size was mildly dilated. 4. The mitral valve is grossly normal. Trivial mitral valve regurgitation. No evidence of mitral stenosis. 5. The aortic valve is tricuspid. Aortic valve regurgitation is not visualized. Mild aortic valve stenosis. Aortic valve area, by VTI measures 1.09 cm. Aortic valve mean gradient measures 17.3 mmHg. Aortic valve Vmax measures 2.78 m/s. 6. The inferior vena cava is normal in size with greater than 50% respiratory variability, suggesting right atrial pressure of 3 mmHg.  FINDINGS Left Ventricle: Left ventricular ejection fraction, by estimation, is 55 to 60%. The left ventricle has normal function. The left ventricle has no regional wall motion abnormalities. The left ventricular internal cavity size was normal in size. There is mild concentric left ventricular hypertrophy. Left ventricular diastolic parameters are indeterminate.  Right Ventricle: The right ventricular size is mildly enlarged. No increase  in right ventricular wall thickness. Right ventricular systolic function is mildly reduced. Tricuspid regurgitation signal is inadequate for assessing PA pressure. The tricuspid regurgitant velocity is 2.36 m/s, and with an assumed right atrial pressure of 3 mmHg, the estimated right ventricular systolic pressure is 25.3 mmHg.  Left Atrium: Left atrial size was mildly dilated.  Right Atrium: Right atrial size was normal in size.  Pericardium: There is no evidence of pericardial effusion.  Mitral Valve: The mitral valve is grossly normal. Trivial mitral valve regurgitation. No evidence of  mitral valve stenosis.  Tricuspid Valve: The tricuspid valve is grossly normal. Tricuspid valve regurgitation is mild . No evidence of tricuspid stenosis.  Aortic Valve: The aortic valve is tricuspid. Aortic valve regurgitation is not visualized. Mild aortic stenosis is present. Aortic valve mean gradient measures 17.3 mmHg. Aortic valve peak gradient measures 30.8 mmHg. Aortic valve area, by VTI measures 1.09 cm.  Pulmonic Valve: The pulmonic valve was grossly normal. Pulmonic valve regurgitation is not visualized. No evidence of pulmonic stenosis.  Aorta: The aortic root and ascending aorta are structurally normal, with no evidence of dilitation.  Venous: The inferior vena cava is normal in size with greater than 50% respiratory variability, suggesting right atrial pressure of 3 mmHg.  IAS/Shunts: The atrial septum is grossly normal.   LEFT VENTRICLE PLAX 2D LVIDd:         4.80 cm   Diastology LVIDs:         3.80 cm   LV e' medial:    8.05 cm/s LV PW:         1.20 cm   LV E/e' medial:  14.0 LV IVS:        1.20 cm   LV e' lateral:   9.68 cm/s LVOT diam:     1.80 cm   LV E/e' lateral: 11.7 LV SV:         54 LV SV Index:   27 LVOT Area:     2.54 cm   RIGHT VENTRICLE             IVC RV Basal diam:  3.50 cm     IVC diam: 1.80 cm RV Mid diam:    3.50 cm RV S prime:     13.30 cm/s TAPSE (M-mode): 1.6 cm  LEFT ATRIUM             Index        RIGHT ATRIUM           Index LA diam:        2.90 cm 1.46 cm/m   RA Area:     20.70 cm LA Vol (A2C):   73.9 ml 37.31 ml/m  RA Volume:   62.40 ml  31.50 ml/m LA Vol (A4C):   72.8 ml 36.75 ml/m LA Biplane Vol: 80.1 ml 40.44 ml/m AORTIC VALVE AV Area (Vmax):    1.14 cm AV Area (Vmean):   1.14 cm AV Area (VTI):     1.09 cm AV Vmax:           277.59 cm/s AV Vmean:          195.073 cm/s AV VTI:            0.493 m AV Peak Grad:      30.8 mmHg AV Mean Grad:      17.3 mmHg LVOT Vmax:         124.00 cm/s LVOT Vmean:        87.300  cm/s  LVOT VTI:          0.212 m LVOT/AV VTI ratio: 0.43  AORTA Ao Root diam: 3.10 cm Ao Asc diam:  3.30 cm  MITRAL VALVE                TRICUSPID VALVE MV Area (PHT): 2.99 cm     TR Peak grad:   22.3 mmHg MV Decel Time: 254 msec     TR Vmax:        236.00 cm/s MV E velocity: 113.00 cm/s SHUNTS Systemic VTI:  0.21 m Systemic Diam: 1.80 cm  Darryle Decent MD Electronically signed by Darryle Decent MD Signature Date/Time: 12/29/2023/4:08:28 PM    Final    MONITORS  LONG TERM MONITOR (3-14 DAYS) 12/12/2022  Narrative Patch Wear Time:  13 days and 8 hours (2024-10-07T05:53:53-0400 to 2024-10-20T14:23:23-0400)  HR 49 - 210, average 75 bpm. 26 nonsustained SVT (longest 7 mins and 38s) and 2 nonsustained VT (longest 6 beats) No atrial fibrillation detected. Rare supraventricular ectopy. Frequent ventricular ectopy, 7.3%. 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 Symptom trigger episodes correspond to PVCs,   Cardiology referral placed based on protocol.   Fonda Kitty Cardiac Electrophysiology       ______________________________________________________________________________________________        Risk Assessment/Calculations:    {Does this patient have ATRIAL FIBRILLATION?:[id number]}  {This patient may be at risk for Amyloid. He has one or more dx on the prob list or PMH from the following list -  Abnormal EKG, HFpEF/Diastolic CHF, Aortic Stenosis, LVH, Bilateral Carpal Tunnel Syndrome, Biceps Tendon Rupture, Spinal Stenosis, Pericardial Effusion, Left Atrial Enlargement, Conduction System Disorder. See list below or review PMH.  Diagnoses From Problem List           Noted     First degree AV block 12/29/2023     QT prolongation 01/03/2024     Sinus pause 12/28/2023    Click HERE to open Cardiac Amyloid Screening SmartSet to order screening OR Click HERE to defer testing for 1 year or permanently :1}    Physical Exam:    VS:  There were no vitals taken for  this visit.   Wt Readings from Last 3 Encounters:  01/06/24 193 lb 2 oz (87.6 kg)  01/02/24 190 lb 3.2 oz (86.3 kg)  12/24/23 208 lb 3.2 oz (94.4 kg)    Gen: *** distress, *** obese/well nourished/malnourished   Neck: No JVD, *** carotid bruit Ears: *** Frank Sign Cardiac: No Rubs or Gallops, *** Murmur, ***cardia, *** radial pulses Respiratory: Clear to auscultation bilaterally, *** effort, ***  respiratory rate GI: Soft, nontender, non-distended *** MS: No *** edema; *** moves all extremities Integument: Skin feels *** Neuro:  At time of evaluation, alert and oriented to person/place/time/situation *** Psych: Normal affect, patient feels ***       ASSESSMENT AND PLAN: .    *** An EKG was ordered for *** and shows ***  Stanly Leavens, MD FASE [facility] Cardiologist [facility]  [address] [place], [place] [address] [phone]  8:04 AM

## 2024-01-07 NOTE — Telephone Encounter (Signed)
 Patient called nurse line regarding issues with manual wheelchair.   He reports that he is unable to adequately use chair to move around his home and this will not work in helping him to get ready for dialysis.   This was ordered by the hospital during recent hospitalization.   Called [employer]. They would be able to process pick up of manual wheelchair, however, patient would need to call to set this up. Call back number for [employer] is [phone].  Of note, [employer] no longer processes electric wheelchairs. Appropriate orders will need to be sent to [facility] and [employer]. Patient will also need OV for medical necessity of power wheelchair.   Called patient and advised of all above information.   Scheduled patient with Dr. Janna on 01/12/24.   Chiquita JAYSON English, RN

## 2024-01-07 NOTE — Progress Notes (Unsigned)
Enrolled for [employer] to mail a ZIO AT Live Telemetry monitor to patients address on file.   Dr. Gasper Sells to read.

## 2024-01-07 NOTE — Transitions of Care (Post Inpatient/ED Visit) (Signed)
   01/07/2024  Name: James Wall MRN: [id number] DOB: 05/21/1965  Today's TOC FU Call Status: Today's TOC FU Call Status:: Successful TOC FU Call Completed TOC FU Call Complete Date: 01/07/24  Patient's Name and Date of Birth confirmed. Name, DOB  Transition Care Management Follow-up Telephone Call Date of Discharge: 01/06/24 Discharge Facility: [facility]) Type of Discharge: Inpatient Admission Primary Inpatient Discharge Diagnosis:: Abdominal pain/constipation, Recent history retroperitoneal bleed s/p renal artery embolization, AV block 1st degree. How have you been since you were released from the hospital?: Better Any questions or concerns?: No  Items Reviewed: Did you receive and understand the discharge instructions provided?: Yes Medications obtained,verified, and reconciled?: No (Patient could not complete call at this time, was on his way to cardiologist follow up appointment but asked for a call back.) Medications Not Reviewed Reasons:: Other: (Patient could not complete call at this time, was on his way to cardiologist follow up appointment but asked for a call back.)  Medications Reviewed Today: Patient could not complete call at this time, was on his way to cardiologist follow up appointment but asked for a call back.  Medications Reviewed Today   Medications were not reviewed in this encounter     Home Care and Equipment/Supplies:Patient could not complete call at this time, was on his way to cardiologist follow up appointment but asked for a call back.     Functional Questionnaire:Patient could not complete call at this time, was on his way to cardiologist follow up appointment but asked for a call back.     Follow up appointments reviewed:Patient could not complete call at this time, was on his way to cardiologist follow up appointment but asked for a call back.    01/07/24: 9066 am: Successful contact with patient answering the call. Patient could not  complete full call at this time, a few questions answered on did he receive and understand discharge instructions, did he have any concerns, was he able to get everything he needs--to which he answered, Yes Ma'am, but I am on my way to my heart doctor follow up appointment and trying to get ready. TOC RN CM asked if he would like a call back later and he said yes, please do. Will call back later today 01/07/24 or 01/08/24.    Bing Edison MSN, RN RN Case Sales [facility]  VBCI-Population Health Office Hours M-F [phone] Direct Dial: [phone] Main Phone [phone]  Fax: [phone] [email or web address]

## 2024-01-08 ENCOUNTER — Telehealth: Payer: Self-pay

## 2024-01-08 NOTE — Patient Instructions (Signed)
 Visit Information  Thank you for taking time to visit with me today. Please don't hesitate to contact me if I can be of assistance to you before our next scheduled telephone appointment.  Our next appointment is by telephone on 01/12/24 at TBD  Following is a copy of your care plan:   Goals Addressed             This Visit's Progress    VBCI Transitions of Care (TOC) Care Plan       Problems:  Recent Hospitalization for treatment of Constipation Knowledge Deficit Related to recent hospitalization issues and constipation Potential for more problem/goals after more complete outreach call completion.   Goal:  Over the next 30 days, the patient will not experience hospital readmission  Interventions:  Transitions of Care: Doctor Visits  - discussed the importance of doctor visits  Patient Self Care Activities:  Attend all scheduled provider appointments Call pharmacy for medication refills 3-7 days in advance of running out of medications Call provider office for new concerns or questions  Notify RN Care Manager of TOC call rescheduling needs Participate in Transition of Care Program/Attend TOC scheduled calls Take medications as prescribed   Zio Cardiac monitor pending in the mail. Contact office if any questions.   Plan:  Next PCP appointment scheduled for:  Telephone follow up appointment with care management team member scheduled for:  01/12/24 Complete medications review and reconciliation         Patient verbalizes understanding of instructions and care plan provided today and agrees to view in MyChart. Active MyChart status and patient understanding of how to access instructions and care plan via MyChart confirmed with patient.     Telephone follow up appointment with care management team member scheduled for: 01/12/24 TBD time.   Please call the care guide team at [phone] if you need to cancel or reschedule your appointment.   Please call the [place]  National  Suicide Prevention Lifeline: (402) 809-3075 or TTY: 563-556-4831 TTY 867-359-9251) to talk to a trained counselor call 1-800-273-TALK (toll free, 24 hour hotline) if you are experiencing a Mental Health or Behavioral Health Crisis or need someone to talk to.   Bing Edison MSN, RN RN [facility] Sales [facility]  VBCI-Population Health Office Hours M-F [phone] Direct Dial: [phone] Main Phone [phone]  Fax: [phone] [email or web address]

## 2024-01-08 NOTE — Transitions of Care (Post Inpatient/ED Visit) (Signed)
 Today's TOC FU Call Status: Today's TOC FU Call Status:: Successful TOC FU Call Completed TOC FU Call Complete Date: 01/08/24  Patient's Name and Date of Birth confirmed. Name, DOB  Transition Care Management Follow-up Telephone Call Date of Discharge: 01/06/24 Discharge Facility: [facility]) Type of Discharge: Inpatient Admission Primary Inpatient Discharge Diagnosis:: Abdominal pain/constipation, Recent history retroperitoneal bleed s/p renal artery embolization, AV block 1st degree. How have you been since you were released from the hospital?: Better Any questions or concerns?: Yes Patient Questions/Concerns:: Questions about electric wc that will addressed at PCP HFU on 01/12/24. Concerns about abd pain that patient stated HD center was aware of but did not address, and that they did not address his medication bag he brought in for that specific reason and was concerned and upset about this.  Items Reviewed: Did you receive and understand the discharge instructions provided?: Yes Medications obtained,verified, and reconciled?: No Medications Not Reviewed Reasons:: Other: (Patient has just returned from HD was laying down upstairs and stated his medication bad and Discharge papers were all downstairs but that he was have them on his bed with him next Tuesday when he requested a call back to completed this outreach call.) Any new allergies since your discharge?: No Dietary orders reviewed?: No Do you have support at home?: Yes People in Home [RPT]: sibling(s) Name of Support/Comfort Primary Source: Lera Niemann (Sister)  [phone] [facility])  Medications Reviewed Today: Patient has just returned from HD was laying down upstairs and stated his medication bad and Discharge papers were all downstairs but that he was have them on his bed with him next Tuesday when he requested a call back to completed this outreach call.  Home Care and Equipment/Supplies: Were Home Health Services  Ordered?: Yes Name of Home Health Agency:: [facility] Has Agency set up a time to come to your home?: Yes First Home Health Visit Date: 01/07/24 Any new equipment or medical supplies ordered?: Yes Name of Medical supply agency?: Electric wheel chair is no longer supplies by [employer], PCP appointment on 01/12/24 to address along with HFU, medical neccesity form to be sent to [facility] and [employer] company by PCP to obtain, patient will keep manual wheel chair delivered by [employer] until receives a replacement but says it takes more out of him to manage that than not using it at all. Were you able to get the equipment/medical supplies?: Yes Do you have any questions related to the use of the equipment/supplies?: Yes What questions do you have?: Electric wheel chair is no longer supplies by [employer], PCP appointment on 01/12/24 to address along with HFU, medical neccesity form to be sent to [facility] and [employer] company by PCP to obtain, patient will keep manual wheel chair delivered by [employer] until receives a replacement but says it takes more out of him to manage that than not using it at all.  Functional Questionnaire: Do you need assistance with bathing/showering or dressing?: No Do you need assistance with meal preparation?: No Do you need assistance with eating?: No Do you have difficulty maintaining continence: No Do you need assistance with getting out of bed/getting out of a chair/moving?: No Do you have difficulty managing or taking your medications?: No  Follow up appointments reviewed: PCP Follow-up appointment confirmed?: Yes Date of PCP follow-up appointment?: 01/12/24 Follow-up Provider: Kathrine Melena at [facility] Follow-up appointment confirmed?: Yes Date of Specialist follow-up appointment?: 02/19/24 Follow-Up Specialty Provider:: Cardiologist (Zio Monitor was mailed to patient on 01/08/24 per  notes, not received as yet per patient. Do you need transportation to  your follow-up appointment?: No Do you understand care options if your condition(s) worsen?: Yes-patient verbalized understanding  SDOH Interventions Today    Flowsheet Row Most Recent Value  SDOH Interventions   Food Insecurity Interventions Intervention Not Indicated  Housing Interventions Intervention Not Indicated  Transportation Interventions SCAT ([employer]), Intervention Not Indicated, Patient Resources (Friends/Family)  Utilities Interventions Intervention Not Indicated    Goals Addressed             This Visit's Progress    VBCI Transitions of Care (TOC) Care Plan       Problems:  Recent Hospitalization for treatment of Constipation Knowledge Deficit Related to recent hospitalization issues and constipation Potential for more problem/goals after more complete outreach call completion.   Goal:  Over the next 30 days, the patient will not experience hospital readmission  Interventions:  Transitions of Care: Doctor Visits  - discussed the importance of doctor visits  Patient Self Care Activities:  Attend all scheduled provider appointments Call pharmacy for medication refills 3-7 days in advance of running out of medications Call provider office for new concerns or questions  Notify RN Care Manager of TOC call rescheduling needs Participate in Transition of Care Program/Attend TOC scheduled calls Take medications as prescribed   Zio Cardiac monitor pending in the mail. Contact office if any questions.   Plan:  Next PCP appointment scheduled for:  Telephone follow up appointment with care management team member scheduled for:  01/12/24 Complete medications review and reconciliation         01/08/24: Partial completion of follow up call requested by patient and agreed to follow up calls with next one scheduled Tuesday 01/12/24 so avoid HD days of MWF. See note above regarding medication reconciliation and incomplete portions of  documentation due to patient declining due to post HD fatigue.   The patient has been provided with contact information for the care management team and has been advised to call with any health-related questions or concerns. The patient verbalized understanding with current POC. The patient is directed to their insurance card regarding availability of benefits coverage     Bing Edison MSN, RN RN Case Manager [facility]  VBCI-Population Health Office Hours M-F [phone] Direct Dial: [phone] Main Phone [phone]  Fax: [phone] [email or web address]

## 2024-01-12 ENCOUNTER — Ambulatory Visit: Admitting: Family Medicine

## 2024-01-12 ENCOUNTER — Other Ambulatory Visit: Payer: Self-pay

## 2024-01-12 NOTE — Patient Instructions (Signed)
 Visit Information  Thank you for taking time to visit with me today. Please don't hesitate to contact me if I can be of assistance to you before our next scheduled telephone appointment.  Our next appointment is by telephone on 01/20/24 at 3 pm  Following is a copy of your care plan:   Goals Addressed             This Visit's Progress    VBCI Transitions of Care (TOC) Care Plan       01/12/24 TOC RN CM reviewed where patient was willing.   Problems:  Recent Hospitalization for treatment of Constipation and Abdominal pain:  Knowledge Deficit Related to recent hospitalization issues and constipation Potential for more problem/goals after more complete outreach call completion.   Goal:  Over the next 30 days, the patient will not experience hospital readmission  Interventions:  Transitions of Care: Constipation: Had a bowel movement today per patient stated but continues to have abdominal pain patient feels related to recent history retroperitoneal bleed s/p renal artery embolization. Post op follow up appointment rescheduled to January.  Encouraged patient to contact to inform of continued pain and a sooner appointment. Go to Urgent Care or PCP if worsens Described pain as 9/10 and that is had been since discharge but was managed with pain medication he is now out of with no refills.  Another rationale discussed for patient to make PCP follow up appointment asap.  Doctor Visits  - discussed the importance of doctor visits Revisited this with rationale as appointment scheduled for today, 01/12/24, but patient canceled due to weather issues.  Patient stated he will reschedule but probably not till next week.   Patient Self Care Activities:  Attend all scheduled provider appointments Call to reschedule appointments on non HD days, patient wishes to call himself.  Call pharmacy for medication refills 3-7 days in advance of running out of medications Call provider office for new  concerns or questions  RE: abdominal pain Notify RN Care Manager of TOC call rescheduling needs Participate in Transition of Care Program/Attend TOC scheduled calls Take medications as prescribed.  Watch for signs of constipation early especially while on pain medications.  Zio Cardiac monitor via mail.  Contact office if any questions.   Plan:  Next PCP appointment scheduled for: TBD by patient.  Telephone follow up appointment with care management team member scheduled for:  12/17/253 pm.  Continue to try to complete medications review and reconciliation and more complete assessments.         Patient verbalizes understanding of instructions and care plan provided today and agrees to view in MyChart. Active MyChart status and patient understanding of how to access instructions and care plan via MyChart confirmed with patient.     The patient has been provided with contact information for the care management team and has been advised to call with any health-related questions or concerns. The patient verbalized understanding with current POC. The patient is directed to their insurance card regarding availability of benefits coverage    Telephone follow up appointment with care management team member scheduled for: Telephone follow up appointment with care management team member scheduled for: 01/20/24 at 3 pm.   Please call the care guide team at [phone] if you need to cancel or reschedule your appointment.   Please call the [place]  National Suicide Prevention Lifeline: 705 803 7807 or TTY: 2364377915 TTY (772)669-3223) to talk to a trained counselor call 1-800-273-TALK (toll free, 24 hour hotline) if you are  experiencing a Mental Health or Behavioral Health Crisis or need someone to talk to.   Bing Edison MSN, RN RN [employer] [facility]  VBCI-Population Health Office Hours M-F [phone] Direct Dial: [phone] Main Phone [phone]  Fax:  [phone] [email or web address]

## 2024-01-12 NOTE — Transitions of Care (Post Inpatient/ED Visit) (Signed)
 Transition of Care week 2  Visit Note  01/12/2024  Name: James Wall MRN: [id number]          DOB: 05-24-1965  Situation: Patient enrolled in [employer] 30-day program. Visit completed with patient by telephone.   Background:   Initial Transition Care Management Follow-up Telephone Call Discharge Date and Diagnosis: 01/06/24, Abdominal pain/constipation, Recent history retroperitoneal bleed s/p renal artery embolization, AV block 1st degree.   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;  dialysis at [facility] on MWF   First degree heart block    GERD (gastroesophageal reflux disease)    Hiatal hernia    History of acute respiratory failure 07/2013   admission;  HCAP w/ ARF with hypoxia   History of adenomatous polyp of colon    History of ascites    s/p paracentesis 01-31-2013 (5L) and last one 03-28-2013 (2.7L)   History of community acquired pneumonia 08/08/2020   admission ; LLL , POA   History of heart murmur in childhood    History of MRSA infection 12/19/2012   hospital admission;  w/ sepsis MRSA bacteramia AVF infection   History of sepsis 03/2017   admission;   HCAP w/ sepsis   Hyperlipidemia    Hypertension    Hypertensive heart disease    cardiologist--- dr emerson leavens;  nuclear stress test 06-16-2013 intermediate risk w/ mid-distal anterior wall ischemia , not gated;  cardiac cath 07-13-2013 in epic showed normal coronaries  and LVSF,  ef not assessed, LCEDP   Hypertensive nephrosclerosis, 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)    Intolerance to cold    due to anemia   Malignant neoplasm prostate [facility])    urologist--- dr Hiawatha Merriott/  radiation onologist--- dr patrcia;  dx 01/ 2023,  Gleason 4+3, PSA 32   Nausea and vomiting 10/19/2022   NICM (nonischemic cardiomyopathy) (HCC)    followed by cardiology;   last echo in epic 09-13-2020 ef 55-60%   OSA (obstructive sleep apnea) 2009   no  longer using cpap since the yr started 2009;   sleep study in epic 05-11-2007 severe osa   Pneumonia    PSVT (paroxysmal supraventricular tachycardia)    event monitor 08-01-2019  SR w/ SVT runs , rare PAC/ PVC   Secondary hyperparathyroidism    Stroke (HCC)    2023 weakness Lhand and Left leg   Wears glasses    Note: Patient declined to review medication list again on this call. Stated that home health RN had come and organized his pills, reviewed list, and placed in pill box for two weeks worth of medications am and pm. Patient is in a lot of pain and does not want to engage too much but yet is thankful and appreciative of the calls. PCP appointment scheduled for today was also canceled due to weather issues and patient prefers to  make appointment himself due to HD on MWF.   Assessment: As much as patient would acknowledge/participate in.  Patient Reported Symptoms: Cognitive Cognitive Status: No symptoms reported, Alert and oriented to person, place, and time, Insightful and able to interpret abstract concepts, Normal speech and language skills      Neurological Neurological Review of Symptoms: No symptoms reported    HEENT HEENT Symptoms Reported: Not assessed      Cardiovascular Cardiovascular Symptoms Reported: No symptoms reported    Respiratory Respiratory Symptoms Reported: No symptoms reported    Endocrine Endocrine Symptoms Reported: No symptoms reported    Gastrointestinal  Gastrointestinal Symptoms Reported: No symptoms reported Additional Gastrointestinal Details: Has bowel movement today per patient. Gastrointestinal Management Strategies: Activity, Medication therapy, Coping strategies Gastrointestinal Self-Management Outcome: 4 (good)    Genitourinary Genitourinary Symptoms Reported: Not assessed    Integumentary Integumentary Symptoms Reported: No symptoms reported    Musculoskeletal Musculoskelatal Symptoms Reviewed: Other Additional Musculoskeletal Details: Fatigue 2/2 pain. Musculoskeletal Management Strategies: Adequate rest, Coping strategies, Medication therapy, Routine screening, Activity Musculoskeletal Self-Management Outcome: 3 (uncertain) Falls in the past year?: No    Psychosocial Psychosocial Symptoms Reported: Other Other Psychosocial Conditions: Dealing with pain Behavioral Management Strategies: Support system, Medication therapy, Activity, Adequate rest, Coping strategies Behavioral Health Self-Management Outcome: 3 (uncertain) Major Change/Loss/Stressor/Fears (CP): Medical condition, self Techniques to Cope with Loss/Stress/Change: Diversional activities Quality of Family Relationships: supportive, helpful, involved Do you feel physically threatened by others?: No   There were no vitals filed for this visit. Pain Scale: 0-10 Pain Score: 9  Pain Type: Chronic pain, Acute pain Pain Location: Abdomen Pain Descriptors / Indicators: Aching Pain Onset: On-going Patients Stated Pain Goal: 0 Pain Intervention(s): Medication (See eMAR), Emotional support, Other (Comment) (Encouraged patient to contact PCP for HFU and surgeon for appointment or go to UC or call  911 if condition worsens.)        Goals Addressed             This Visit's Progress    VBCI Transitions of Care (TOC) Care Plan       01/12/24 TOC RN CM reviewed where patient was willing.   Problems:  Recent Hospitalization for treatment of Constipation and  Abdominal pain:  Knowledge Deficit Related to recent hospitalization issues and constipation Potential for more problem/goals after more complete outreach call completion.   Goal:  Over the next 30 days, the patient will not experience hospital readmission  Interventions:  Transitions of Care: Constipation: Had a bowel movement today per patient stated but continues to have abdominal pain patient feels related to recent history retroperitoneal bleed s/p renal artery embolization. Post op follow up appointment rescheduled to January.  Encouraged patient to contact to inform of continued pain and a sooner appointment. Go to Urgent Care or PCP if worsens Described pain as 9/10 and that is had been since discharge but was managed with pain medication he is now out of with no refills.  Another rationale discussed for patient to make PCP follow up appointment asap.  Doctor Visits  - discussed the importance of doctor visits Revisited this with rationale as appointment scheduled for today, 01/12/24, but patient canceled due to weather issues.  Patient stated he will reschedule but probably not till next week.   Patient Self Care Activities:  Attend all scheduled provider appointments Call to reschedule appointments on non HD days, patient wishes to call himself.  Call pharmacy for medication refills 3-7 days in advance of running out of  medications Call provider office for new concerns or questions  RE: abdominal pain Notify RN Care Manager of TOC call rescheduling needs Participate in Transition of Care Program/Attend TOC scheduled calls Take medications as prescribed.  Watch for signs of constipation early especially while on pain medications.  Zio Cardiac monitor via mail.  Contact office if any questions.   Plan:  Next PCP appointment scheduled for: TBD by patient.  Telephone follow up appointment with care management team member scheduled for:  12/17/253 pm.  Continue to try to  complete medications review and reconciliation and more complete assessments.         01/12/24: See note above: Difficult time for patient due to pain issues, attempting to get him to contact PCP or surgeon/go to UC or call 911 if worsens, and participate in medication review/assessments has been difficult though patient states he appreciates the call, he wishes for a quick check in. Will follow up next week.   Recommendation:   Continue Current Plan of Care  Follow Up Plan:   Telephone follow-up in 1 week   Bing Edison MSN, RN RN Case Manager [facility]  VBCI-Population Health Office Hours M-F [phone] Direct Dial: [phone] Main Phone [phone]  Fax: [phone] [email or web address]

## 2024-01-19 ENCOUNTER — Telehealth

## 2024-01-20 NOTE — Progress Notes (Signed)
 James Wall                                          MRN: [id number]   01/20/2024   The VBCI Quality Team Specialist reviewed this patient medical record for the purposes of chart review for care gap closure. The following were reviewed: chart review for care gap closure-glycemic status assessment.    VBCI Quality Team

## 2024-01-21 ENCOUNTER — Telehealth: Payer: Self-pay

## 2024-01-21 ENCOUNTER — Telehealth

## 2024-01-23 ENCOUNTER — Other Ambulatory Visit (HOSPITAL_COMMUNITY): Payer: Self-pay

## 2024-02-17 NOTE — Progress Notes (Unsigned)
 " Cardiology Office Note:    Date:  02/17/2024   ID:  James Wall, DOB [birth year], MRN [id number]  PCP:  Janna Ferrier, DO   [facility] Providers Cardiologist:  Stanly DELENA Leavens, MD { Click to update primary MD,subspecialty MD or APP then REFRESH:1}    Referring MD: Janna Ferrier, DO   Chief complaint: Hospital follow-up     History of Present Illness:   James Wall is a 59 y.o. male with a hx of tobacco use, COPD, hypertension, hyperlipidemia, cirrhosis, ESRD on HD, heart failure with improved LVEF, paroxysmal SVT, prior CVA on 09/2021, OSA, and prostate cancer initially diagnosed in 2023 who is being seen in clinic today for follow-up of recent hospitalization for abdominal pain.   In 2015 the patient was seen for exertional dyspnea and fatigue.  A Lexiscan  was ordered and was intermediate risk for anterior wall ischemia.  Echocardiogram showed a normal LVEF.  A cardiac catheterization was done on 07/2013 that showed no CAD.  A Holter monitor was ordered and showed frequent PVCs.   In 2019 the patient was hospitalized for hypertensive urgency.  An echo showed a decreased LVEF of 45 to 50%, severe pulmonary hypertension ( ).  A left and right heart cath was recommended but the patient refused.  He was started on antihypertensive medications. He was also started on metoprolol  for palpitations and PVCs.   On 03/2019 the patient wore a cardiac monitor that showed few short runs of SVT, rare PVCs, and PACs.  At that time an echocardiogram was also done and showed a normal LVEF of 55 to 60%, moderate LVH, mildly elevated PASP, severe LAE, and normal RV function.  Patient was on multiple medications for his hypertension.   In 2023 the patient was hospitalized for a stroke.     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  Echocardiogram at this time showed a  normal LVEF of 60 to 65%, moderate LVH, G2 DD, and aortic sclerosis.   On 08/2023 the patient presented to the emergency department for worsening dyspnea on exertion.  He was felt to be in heart failure.  He was noted to previously have missed dialysis. Echocardiogram during this hospitalization showed a normal LVEF of 60 to 65%, mild concentric LVH, normal RV systolic function, severely elevated pulmonary artery systolic pressures, moderately dilated left atrium, and mild MR. The patient was seen in the office for outpatient follow-up by [facility] on 09/2023.  He reported ongoing dyspnea on exertion.  At this appointment a cardiac catheterization was discussed with the patient but he refused because he wanted general anesthesia.  The patient was felt to be volume up.  The patient did report that he leaves dialysis sessions early.  The patient felt that his metoprolol  was contributing to his fatigue and bradycardia.  His metoprolol  to tartrate was reduced from 75 mg twice daily to 50 mg twice daily.  A ZIO monitor was ordered but patient reportedly sent in the mail for several weeks prior to being picked up.  Patient was admitted to hospital 12/25/2023 following spontaneous rupture of left renal neoplasm and extensive retroperitoneal hemorrhage with large left phrenic hematoma.  He underwent an IR embolization of the left renal artery on 11/21 and was admitted to the ICU for monitoring s/p procedure. After procedure and stabilization, patient was transferred to FMTS on 11/22. Hospital course was complicated by persistent abdominal  pain and anemia requiring additional blood products. Throughout hospitalization, patient received a total of 3 units PRBC with stabilization of Hgb prior to discharge. Ultimately discharged home with home health PT/OT.  Admitted to the hospital 01/03/2024 following continued abdominal pain with severe constipation since his admission for the retroperitoneal hemorrhage a week  earlier.  Telemetry had reported the patient had a 5.3-second pause with sinus bradycardia into the 30s while sleeping, cardiology was consulted.  Metoprolol  was stopped, AV nodal agents avoided.  Outpatient sleep study was recommended given likelihood these pauses occurred while patient was sleeping, with prior history of OSA, noncompliant with CPAP.  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  2-week live ZIO monitor ordered at discharge.  Intermittent AV node non-conduction  OSA  Coronary calcifications Hyperlipidemia  Chronic diastolic heart failure  Hypertension  ROS:   Please see the history of present illness.    *** All other systems reviewed and are negative.     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 [facility])    primary cancer is prostate   Anemia associated with chronic renal failure    Anxiety    Arthritis    CHF (congestive heart failure) (HCC)    Cirrhosis, nonalcoholic (HCC)    per pt possible from a medication , unsure ;   last ultrasound 08-09-2020 in epic no fibrosis   COPD (chronic obstructive pulmonary disease) (HCC)    Depression    ESRD on hemodialysis (HCC) 2009   Started HD Jan 2009;  ESRD secondary to hypertensive nephrosclerosis;  dialysis at [facility] on MWF   First degree heart block    GERD (gastroesophageal reflux disease)    Hiatal hernia    History of acute respiratory failure 07/2013   admission;  HCAP w/ ARF with hypoxia   History of adenomatous polyp of colon    History of ascites    s/p paracentesis 01-31-2013 (5L) and last one 03-28-2013 (2.7L)   History of community acquired pneumonia 08/08/2020   admission ; LLL , POA   History of heart murmur in childhood    History of MRSA infection 12/19/2012   hospital admission;  w/ sepsis MRSA bacteramia AVF infection   History of sepsis  03/2017   admission;   HCAP w/ sepsis   Hyperlipidemia    Hypertension    Hypertensive heart disease    cardiologist--- dr emerson leavens;  nuclear stress test 06-16-2013 intermediate risk w/ mid-distal anterior wall ischemia , not gated;  cardiac cath 07-13-2013 in epic showed normal coronaries and LVSF,  ef not assessed, LCEDP   Hypertensive nephrosclerosis, 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)    Intolerance to cold    due to anemia   Malignant neoplasm prostate [facility])    urologist--- dr bell/  radiation onologist--- dr patrcia;  dx 01/ 2023,  Gleason 4+3, PSA 32   Nausea and vomiting 10/19/2022   NICM (nonischemic cardiomyopathy) (HCC)    followed by cardiology;   last echo in epic 09-13-2020 ef 55-60%   OSA (obstructive sleep apnea) 2009   no  longer using cpap since the yr started 2009;   sleep study in epic 05-11-2007 severe osa   Pneumonia    PSVT (paroxysmal supraventricular tachycardia)    event monitor 08-01-2019  SR w/ SVT runs , rare  PAC/ PVC   Secondary hyperparathyroidism    Stroke (HCC)    2023 weakness Lhand and Left leg   Wears glasses     Past Surgical History:  Procedure Laterality Date   AV FISTULA PLACEMENT Right 02/22/2013   Procedure:  CREATION  OF BRACHIAL CEPHALIC FISTULA RIGHT ARM;  Surgeon: Carlin FORBES Haddock, MD;  Location: [facility] OR;  Service: Vascular;  Laterality: Right;   AV FISTULA PLACEMENT Left 08/10/2014   Procedure: BASILIC VEIN TRANSPOSITION  ARTERIOVENOUS (AV) FISTULA CREATION LEFT UPPER ARM;  Surgeon: Lynwood JONETTA Collum, MD;  Location: [facility] OR;  Service: Vascular;  Laterality: Left;   AV FISTULA PLACEMENT, RADIOCEPHALIC Left 02/22/2007   @[facility] ;  Left lower arm   COLONOSCOPY  11/30/2018   by dr beavers   CYSTOSCOPY W/ RETROGRADES Bilateral 06/26/2022   Procedure: CYSTOSCOPY WITH BILATERAL RETROGRADE PYELOGRAM;  Surgeon: Carolee Sherwood JONETTA DOUGLAS, MD;  Location: [facility] ORS;  Service: Urology;  Laterality: Bilateral;    ESOPHAGOGASTRODUODENOSCOPY (EGD) WITH PROPOFOL  N/A 04/12/2013   Procedure: ESOPHAGOGASTRODUODENOSCOPY (EGD) WITH PROPOFOL ;  Surgeon: Elsie Cree, MD;  Location: [facility] ENDOSCOPY;  Service: Endoscopy;  Laterality: N/A;   EXCHANGE OF A DIALYSIS CATHETER Right 09/24/2021   Procedure: EXCHANGE OF A DIALYSIS CATHETER;  Surgeon: Eliza Lonni RAMAN, MD;  Location: [facility] OR;  Service: Vascular;  Laterality: Right;   GOLD SEED IMPLANT N/A 06/11/2021   Procedure: GOLD SEED IMPLANT;  Surgeon: Selma Donnice SAUNDERS, MD;  Location: [facility] ORS;  Service: Urology;  Laterality: N/A;   INSERTION OF DIALYSIS CATHETER N/A 12/23/2012   Procedure: INSERTION OF DIALYSIS CATHETER; ULTRASOUND GUIDED;  Surgeon: Lonni RAMAN Eliza, MD;  Location: [facility] OR;  Service: Vascular;  Laterality: N/A;   INSERTION OF DIALYSIS CATHETER  10/22/2015   Right IJ non-tunneled HD catheter, placed again in 2019   IR ANGIOGRAM SELECTIVE EACH ADDITIONAL VESSEL  12/25/2023   IR ANGIOGRAM SELECTIVE EACH ADDITIONAL VESSEL  12/25/2023   IR ANGIOGRAM SELECTIVE EACH ADDITIONAL VESSEL  12/25/2023   IR EMBO ART  VEN HEMORR LYMPH EXTRAV  INC GUIDE ROADMAPPING  12/25/2023   IR FLUORO GUIDE CV LINE RIGHT  08/18/2019   IR FLUORO GUIDE CV LINE RIGHT  10/07/2019   IR GENERIC HISTORICAL  10/22/2015   IR US  GUIDE VASC ACCESS RIGHT 10/22/2015 [facility]   IR GENERIC HISTORICAL  10/22/2015   IR FLUORO GUIDE CV LINE RIGHT 10/22/2015 [facility]   IR GENERIC HISTORICAL  10/23/2015   IR FLUORO GUIDE CV LINE RIGHT 10/23/2015 Rome Hall, MD [facility]   IR RENAL SELECTIVE  UNI INC S&I MOD SED  12/25/2023   IR US  GUIDE VASC ACCESS RIGHT  12/25/2023   LEFT HEART CATHETERIZATION WITH CORONARY ANGIOGRAM N/A 07/13/2013   Procedure: LEFT HEART CATHETERIZATION WITH CORONARY ANGIOGRAM;  Surgeon: Candyce RAMAN Reek, MD;  Location: [facility] CATH LAB;  Service: Cardiovascular;  Laterality: N/A;   LIGATION OF ARTERIOVENOUS  FISTULA Left 12/22/2012   Procedure: LIGATION OF  ARTERIOVENOUS  FISTULA;EXCISION OF LARGE ANEURYSMS;;  Surgeon: Carlin FORBES Haddock, MD;  Location: [facility] OR;  Service: Vascular;  Laterality: Left;   SPACE OAR INSTILLATION N/A 06/11/2021   Procedure: SPACE OAR INSTILLATION;  Surgeon: Selma Donnice SAUNDERS, MD;  Location: [facility] ORS;  Service: Urology;  Laterality: N/A;   UPPER GASTROINTESTINAL ENDOSCOPY  09/07/2018   by dr eda    Current Medications: Active Medications[1]   Allergies:   Venofer   [ferric oxide] and Aspirin    Social History   Socioeconomic History   Marital status: Single  Spouse name: Not on file   Number of children: 0   Years of education: 12th   Highest education level: High school graduate  Occupational History   Occupation: n/a  Tobacco Use   Smoking status: Former    Current packs/day: 0.50    Average packs/day: 0.5 packs/day for 17.0 years (8.5 ttl pk-yrs)    Types: Cigarettes   Smokeless tobacco: Never   Tobacco comments:    08/27/23 quit 4 weeks ago  Vaping Use   Vaping status: Never Used  Substance and Sexual Activity   Alcohol use: Yes    Comment: Holidays has one drink   Drug use: Yes    Frequency: 4.0 times per week    Types: Marijuana    Comment: 06-06-2021 per pt smokes 2-4 weekly, (half of joint blunt)   Sexual activity: Not Currently  Other Topics Concern   Not on file  Social History Narrative   Patient lives alone in [place].    Patient has never been married and does not have any children.    Patients support system, his mother, pass away 2016.   Patient does not own his own vehicle, uses public transportation with no concerns at this time.   Social Drivers of Health   Tobacco Use: Medium Risk (01/04/2024)   Patient History    Smoking Tobacco Use: Former    Smokeless Tobacco Use: Never    Passive Exposure: Not on file  Financial Resource Strain: Low Risk (08/27/2023)   Overall Financial Resource Strain (CARDIA)    Difficulty of Paying Living Expenses: Not very hard  Food Insecurity:  No Food Insecurity (01/08/2024)   Epic    Worried About Programme Researcher, Broadcasting/film/video in the Last Year: Never true    Ran Out of Food in the Last Year: Never true  Transportation Needs: No Transportation Needs (01/08/2024)   Epic    Lack of Transportation (Medical): No    Lack of Transportation (Non-Medical): No  Physical Activity: Inactive (08/27/2023)   Exercise Vital Sign    Days of Exercise per Week: 0 days    Minutes of Exercise per Session: 0 min  Stress: No Stress Concern Present (08/05/2022)   [employer] of Occupational Health - Occupational Stress Questionnaire    Feeling of Stress : Not at all  Social Connections: Moderately Isolated (01/04/2024)   Social Connection and Isolation Panel    Frequency of Communication with Friends and Family: Once a week    Frequency of Social Gatherings with Friends and Family: More than three times a week    Attends Religious Services: More than 4 times per year    Active Member of Clubs or Organizations: No    Attends Banker Meetings: Never    Marital Status: Never married  Depression (PHQ2-9): Medium Risk (01/12/2024)   Depression (PHQ2-9)    PHQ-2 Score: 6  Alcohol Screen: Low Risk (08/27/2023)   Alcohol Screen    Last Alcohol Screening Score (AUDIT): 0  Housing: Low Risk (01/08/2024)   Epic    Unable to Pay for Housing in the Last Year: No    Number of Times Moved in the Last Year: 0    Homeless in the Last Year: No  Utilities: Not At Risk (01/08/2024)   Epic    Threatened with loss of utilities: No  Health Literacy: Adequate Health Literacy (08/27/2023)   B1300 Health Literacy    Frequency of need for help with medical instructions: Never  Family History: The patient's ***family history includes Asthma in his brother; Cerebrovascular Accident in his father; Congestive Heart Failure in his brother; Hypertension in his father and mother. There is no history of Stomach cancer, Rectal cancer, Esophageal cancer, or Colon  cancer.  EKGs/Labs/Other Studies Reviewed:    The following studies were reviewed today: ***      Recent Labs: 08/20/2023: B Natriuretic Peptide 3,638.8 01/05/2024: ALT 20; Hemoglobin 9.7; Platelets 255 01/06/2024: BUN 44; Creatinine, Ser 9.58; Magnesium  1.9; Potassium 4.3; Sodium 131  Recent Lipid Panel    Component Value Date/Time   CHOL 142 12/24/2023 1448   TRIG 70 12/24/2023 1448   HDL 67 12/24/2023 1448   CHOLHDL 2.1 12/24/2023 1448   CHOLHDL 2.1 09/14/2021 2138   VLDL 12 09/14/2021 2138   LDLCALC 61 12/24/2023 1448     Risk Assessment/Calculations:   {Does this patient have ATRIAL FIBRILLATION?:[phone]}  No BP recorded.  {Refresh Note OR Click here to enter BP  :1}***         Physical Exam:    VS:  There were no vitals taken for this visit.       Wt Readings from Last 3 Encounters:  01/06/24 193 lb 2 oz (87.6 kg)  01/02/24 190 lb 3.2 oz (86.3 kg)  12/24/23 208 lb 3.2 oz (94.4 kg)     GEN: *** Well nourished, well developed in no acute distress HEENT: Normal NECK: No JVD; No carotid bruits CARDIAC: *** S1-S2 normal, RRR, no murmurs, rubs, gallops RESPIRATORY:  Clear to auscultation without rales, wheezing or rhonchi  MUSCULOSKELETAL:  No edema; No deformity  SKIN: Warm and dry NEUROLOGIC:  Alert and oriented x 3 PSYCHIATRIC:  Normal affect       Assessment & Plan   Disposition: *** Route to primary cardiologist      {Are you ordering a CV Procedure (e.g. stress test, cath, DCCV, TEE, etc)?   Press F2        :[id number]}    Medication Adjustments/Labs and Tests Ordered: Current medicines are reviewed at length with the patient today.  Concerns regarding medicines are outlined above.  No orders of the defined types were placed in this encounter.  No orders of the defined types were placed in this encounter.   There are no Patient Instructions on file for this visit.   Signed, Fredi Geiler E Dally Oshel, NP  02/17/2024 10:49 PM    [facility]     [1]  No outpatient medications have been marked as taking for the 02/19/24 encounter (Appointment) with Burtis Imhoff E, NP.   "

## 2024-02-19 ENCOUNTER — Ambulatory Visit: Admitting: Emergency Medicine
